# Patient Record
Sex: Male | Born: 1955
Health system: Southern US, Community
[De-identification: ages and names within clinical notes are randomized; demographics above are authoritative.]

## PROBLEM LIST (undated history)

## (undated) DIAGNOSIS — G4700 Insomnia, unspecified: Secondary | ICD-10-CM

## (undated) DIAGNOSIS — M5126 Other intervertebral disc displacement, lumbar region: Secondary | ICD-10-CM

## (undated) DIAGNOSIS — D696 Thrombocytopenia, unspecified: Secondary | ICD-10-CM

## (undated) DIAGNOSIS — R609 Edema, unspecified: Secondary | ICD-10-CM

## (undated) DIAGNOSIS — I10 Essential (primary) hypertension: Secondary | ICD-10-CM

## (undated) DIAGNOSIS — I509 Heart failure, unspecified: Secondary | ICD-10-CM

## (undated) DIAGNOSIS — G894 Chronic pain syndrome: Secondary | ICD-10-CM

## (undated) DIAGNOSIS — E46 Unspecified protein-calorie malnutrition: Secondary | ICD-10-CM

## (undated) DIAGNOSIS — K760 Fatty (change of) liver, not elsewhere classified: Secondary | ICD-10-CM

## (undated) DIAGNOSIS — I952 Hypotension due to drugs: Secondary | ICD-10-CM

## (undated) DIAGNOSIS — E871 Hypo-osmolality and hyponatremia: Secondary | ICD-10-CM

## (undated) DIAGNOSIS — D638 Anemia in other chronic diseases classified elsewhere: Secondary | ICD-10-CM

## (undated) DIAGNOSIS — R600 Localized edema: Secondary | ICD-10-CM

## (undated) DIAGNOSIS — R0600 Dyspnea, unspecified: Secondary | ICD-10-CM

## (undated) DIAGNOSIS — K5903 Drug induced constipation: Secondary | ICD-10-CM

## (undated) DIAGNOSIS — E785 Hyperlipidemia, unspecified: Secondary | ICD-10-CM

## (undated) DIAGNOSIS — E8809 Other disorders of plasma-protein metabolism, not elsewhere classified: Secondary | ICD-10-CM

## (undated) DIAGNOSIS — J9601 Acute respiratory failure with hypoxia: Secondary | ICD-10-CM

## (undated) DIAGNOSIS — R579 Shock, unspecified: Secondary | ICD-10-CM

## (undated) DIAGNOSIS — E876 Hypokalemia: Secondary | ICD-10-CM

## (undated) DIAGNOSIS — K7460 Unspecified cirrhosis of liver: Secondary | ICD-10-CM

## (undated) HISTORY — PX: HEMORROIDECTOMY: SUR656

## (undated) HISTORY — DX: Fatty (change of) liver, not elsewhere classified: K76.0

## (undated) HISTORY — DX: Heart failure, unspecified: I50.9

---
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
## 2016-04-15 DIAGNOSIS — I10 Essential (primary) hypertension: Secondary | ICD-10-CM | POA: Diagnosis not present

## 2016-04-16 DIAGNOSIS — M47896 Other spondylosis, lumbar region: Secondary | ICD-10-CM | POA: Diagnosis not present

## 2016-04-16 DIAGNOSIS — F329 Major depressive disorder, single episode, unspecified: Secondary | ICD-10-CM | POA: Diagnosis not present

## 2016-04-16 DIAGNOSIS — I10 Essential (primary) hypertension: Secondary | ICD-10-CM | POA: Diagnosis not present

## 2016-04-16 DIAGNOSIS — M25562 Pain in left knee: Secondary | ICD-10-CM | POA: Diagnosis not present

## 2016-04-16 DIAGNOSIS — E785 Hyperlipidemia, unspecified: Secondary | ICD-10-CM | POA: Diagnosis not present

## 2016-04-16 DIAGNOSIS — J45909 Unspecified asthma, uncomplicated: Secondary | ICD-10-CM | POA: Diagnosis not present

## 2016-04-16 DIAGNOSIS — M48061 Spinal stenosis, lumbar region without neurogenic claudication: Secondary | ICD-10-CM | POA: Diagnosis not present

## 2016-04-16 DIAGNOSIS — I872 Venous insufficiency (chronic) (peripheral): Secondary | ICD-10-CM | POA: Diagnosis not present

## 2016-04-16 DIAGNOSIS — G4700 Insomnia, unspecified: Secondary | ICD-10-CM | POA: Diagnosis not present

## 2016-07-13 DIAGNOSIS — J45909 Unspecified asthma, uncomplicated: Secondary | ICD-10-CM | POA: Diagnosis not present

## 2016-07-13 DIAGNOSIS — I10 Essential (primary) hypertension: Secondary | ICD-10-CM | POA: Diagnosis not present

## 2016-07-13 DIAGNOSIS — I872 Venous insufficiency (chronic) (peripheral): Secondary | ICD-10-CM | POA: Diagnosis not present

## 2016-07-13 DIAGNOSIS — M48061 Spinal stenosis, lumbar region without neurogenic claudication: Secondary | ICD-10-CM | POA: Diagnosis not present

## 2016-07-13 DIAGNOSIS — G4700 Insomnia, unspecified: Secondary | ICD-10-CM | POA: Diagnosis not present

## 2016-07-13 DIAGNOSIS — M47896 Other spondylosis, lumbar region: Secondary | ICD-10-CM | POA: Diagnosis not present

## 2016-07-13 DIAGNOSIS — M25562 Pain in left knee: Secondary | ICD-10-CM | POA: Diagnosis not present

## 2016-07-13 DIAGNOSIS — E785 Hyperlipidemia, unspecified: Secondary | ICD-10-CM | POA: Diagnosis not present

## 2016-08-10 DIAGNOSIS — I10 Essential (primary) hypertension: Secondary | ICD-10-CM | POA: Diagnosis not present

## 2016-08-11 DIAGNOSIS — J45909 Unspecified asthma, uncomplicated: Secondary | ICD-10-CM | POA: Diagnosis not present

## 2016-08-11 DIAGNOSIS — M48061 Spinal stenosis, lumbar region without neurogenic claudication: Secondary | ICD-10-CM | POA: Diagnosis not present

## 2016-08-11 DIAGNOSIS — G4700 Insomnia, unspecified: Secondary | ICD-10-CM | POA: Diagnosis not present

## 2016-08-11 DIAGNOSIS — I10 Essential (primary) hypertension: Secondary | ICD-10-CM | POA: Diagnosis not present

## 2016-08-11 DIAGNOSIS — E785 Hyperlipidemia, unspecified: Secondary | ICD-10-CM | POA: Diagnosis not present

## 2016-08-11 DIAGNOSIS — I872 Venous insufficiency (chronic) (peripheral): Secondary | ICD-10-CM | POA: Diagnosis not present

## 2016-08-11 DIAGNOSIS — M47896 Other spondylosis, lumbar region: Secondary | ICD-10-CM | POA: Diagnosis not present

## 2016-11-09 DIAGNOSIS — I10 Essential (primary) hypertension: Secondary | ICD-10-CM | POA: Diagnosis not present

## 2016-11-10 DIAGNOSIS — M47896 Other spondylosis, lumbar region: Secondary | ICD-10-CM | POA: Diagnosis not present

## 2016-11-10 DIAGNOSIS — E785 Hyperlipidemia, unspecified: Secondary | ICD-10-CM | POA: Diagnosis not present

## 2016-11-10 DIAGNOSIS — I872 Venous insufficiency (chronic) (peripheral): Secondary | ICD-10-CM | POA: Diagnosis not present

## 2016-11-10 DIAGNOSIS — I10 Essential (primary) hypertension: Secondary | ICD-10-CM | POA: Diagnosis not present

## 2016-11-10 DIAGNOSIS — G4700 Insomnia, unspecified: Secondary | ICD-10-CM | POA: Diagnosis not present

## 2016-11-10 DIAGNOSIS — M48061 Spinal stenosis, lumbar region without neurogenic claudication: Secondary | ICD-10-CM | POA: Diagnosis not present

## 2016-11-10 DIAGNOSIS — J45909 Unspecified asthma, uncomplicated: Secondary | ICD-10-CM | POA: Diagnosis not present

## 2017-02-02 DIAGNOSIS — I10 Essential (primary) hypertension: Secondary | ICD-10-CM | POA: Diagnosis not present

## 2017-02-03 DIAGNOSIS — J45909 Unspecified asthma, uncomplicated: Secondary | ICD-10-CM | POA: Diagnosis not present

## 2017-02-03 DIAGNOSIS — E785 Hyperlipidemia, unspecified: Secondary | ICD-10-CM | POA: Diagnosis not present

## 2017-02-03 DIAGNOSIS — I10 Essential (primary) hypertension: Secondary | ICD-10-CM | POA: Diagnosis not present

## 2017-02-03 DIAGNOSIS — G4700 Insomnia, unspecified: Secondary | ICD-10-CM | POA: Diagnosis not present

## 2017-02-03 DIAGNOSIS — I872 Venous insufficiency (chronic) (peripheral): Secondary | ICD-10-CM | POA: Diagnosis not present

## 2017-02-03 DIAGNOSIS — M48061 Spinal stenosis, lumbar region without neurogenic claudication: Secondary | ICD-10-CM | POA: Diagnosis not present

## 2017-02-03 DIAGNOSIS — M47896 Other spondylosis, lumbar region: Secondary | ICD-10-CM | POA: Diagnosis not present

## 2017-03-02 DIAGNOSIS — F5101 Primary insomnia: Secondary | ICD-10-CM | POA: Diagnosis not present

## 2017-03-02 DIAGNOSIS — M5442 Lumbago with sciatica, left side: Secondary | ICD-10-CM | POA: Diagnosis not present

## 2017-03-02 DIAGNOSIS — G8929 Other chronic pain: Secondary | ICD-10-CM | POA: Diagnosis not present

## 2017-03-02 DIAGNOSIS — M5441 Lumbago with sciatica, right side: Secondary | ICD-10-CM | POA: Diagnosis not present

## 2017-03-15 DIAGNOSIS — Z79891 Long term (current) use of opiate analgesic: Secondary | ICD-10-CM | POA: Diagnosis not present

## 2017-07-21 DIAGNOSIS — F5101 Primary insomnia: Secondary | ICD-10-CM | POA: Diagnosis not present

## 2017-07-21 DIAGNOSIS — I10 Essential (primary) hypertension: Secondary | ICD-10-CM | POA: Diagnosis not present

## 2017-07-21 DIAGNOSIS — Z79899 Other long term (current) drug therapy: Secondary | ICD-10-CM | POA: Diagnosis not present

## 2017-07-21 DIAGNOSIS — G8929 Other chronic pain: Secondary | ICD-10-CM | POA: Diagnosis not present

## 2017-07-21 DIAGNOSIS — E7801 Familial hypercholesterolemia: Secondary | ICD-10-CM | POA: Diagnosis not present

## 2017-07-21 DIAGNOSIS — M5441 Lumbago with sciatica, right side: Secondary | ICD-10-CM | POA: Diagnosis not present

## 2018-02-17 DIAGNOSIS — D487 Neoplasm of uncertain behavior of other specified sites: Secondary | ICD-10-CM | POA: Diagnosis not present

## 2018-02-17 DIAGNOSIS — E7801 Familial hypercholesterolemia: Secondary | ICD-10-CM | POA: Diagnosis not present

## 2018-02-17 DIAGNOSIS — G8929 Other chronic pain: Secondary | ICD-10-CM | POA: Diagnosis not present

## 2018-02-17 DIAGNOSIS — Z79899 Other long term (current) drug therapy: Secondary | ICD-10-CM | POA: Diagnosis not present

## 2018-02-17 DIAGNOSIS — I10 Essential (primary) hypertension: Secondary | ICD-10-CM | POA: Diagnosis not present

## 2018-02-17 DIAGNOSIS — M5441 Lumbago with sciatica, right side: Secondary | ICD-10-CM | POA: Diagnosis not present

## 2018-02-17 DIAGNOSIS — F5101 Primary insomnia: Secondary | ICD-10-CM | POA: Diagnosis not present

## 2018-03-07 DIAGNOSIS — L570 Actinic keratosis: Secondary | ICD-10-CM | POA: Diagnosis not present

## 2018-03-07 DIAGNOSIS — L821 Other seborrheic keratosis: Secondary | ICD-10-CM | POA: Diagnosis not present

## 2018-03-07 DIAGNOSIS — D485 Neoplasm of uncertain behavior of skin: Secondary | ICD-10-CM | POA: Diagnosis not present

## 2018-03-19 DIAGNOSIS — J189 Pneumonia, unspecified organism: Secondary | ICD-10-CM | POA: Diagnosis not present

## 2018-04-20 DIAGNOSIS — I8310 Varicose veins of unspecified lower extremity with inflammation: Secondary | ICD-10-CM | POA: Diagnosis not present

## 2018-04-20 DIAGNOSIS — L578 Other skin changes due to chronic exposure to nonionizing radiation: Secondary | ICD-10-CM | POA: Diagnosis not present

## 2018-08-25 DIAGNOSIS — I10 Essential (primary) hypertension: Secondary | ICD-10-CM | POA: Diagnosis not present

## 2018-08-25 DIAGNOSIS — E7801 Familial hypercholesterolemia: Secondary | ICD-10-CM | POA: Diagnosis not present

## 2018-08-25 DIAGNOSIS — R0601 Orthopnea: Secondary | ICD-10-CM | POA: Diagnosis not present

## 2018-08-25 DIAGNOSIS — Z79899 Other long term (current) drug therapy: Secondary | ICD-10-CM | POA: Diagnosis not present

## 2018-08-25 DIAGNOSIS — R600 Localized edema: Secondary | ICD-10-CM | POA: Diagnosis not present

## 2018-08-25 DIAGNOSIS — F5101 Primary insomnia: Secondary | ICD-10-CM | POA: Diagnosis not present

## 2018-08-31 DIAGNOSIS — I8310 Varicose veins of unspecified lower extremity with inflammation: Secondary | ICD-10-CM | POA: Diagnosis not present

## 2018-08-31 DIAGNOSIS — L820 Inflamed seborrheic keratosis: Secondary | ICD-10-CM | POA: Diagnosis not present

## 2018-09-12 DIAGNOSIS — D696 Thrombocytopenia, unspecified: Secondary | ICD-10-CM | POA: Diagnosis not present

## 2018-09-28 DIAGNOSIS — R600 Localized edema: Secondary | ICD-10-CM | POA: Diagnosis not present

## 2018-09-28 DIAGNOSIS — R0600 Dyspnea, unspecified: Secondary | ICD-10-CM | POA: Diagnosis not present

## 2018-09-28 DIAGNOSIS — R5383 Other fatigue: Secondary | ICD-10-CM | POA: Diagnosis not present

## 2018-09-29 DIAGNOSIS — R0602 Shortness of breath: Secondary | ICD-10-CM | POA: Diagnosis not present

## 2018-09-29 DIAGNOSIS — R0600 Dyspnea, unspecified: Secondary | ICD-10-CM | POA: Diagnosis not present

## 2018-09-30 DIAGNOSIS — M5441 Lumbago with sciatica, right side: Secondary | ICD-10-CM | POA: Diagnosis not present

## 2018-09-30 DIAGNOSIS — R0600 Dyspnea, unspecified: Secondary | ICD-10-CM | POA: Diagnosis not present

## 2018-09-30 DIAGNOSIS — R5383 Other fatigue: Secondary | ICD-10-CM | POA: Diagnosis not present

## 2018-10-24 DIAGNOSIS — G4733 Obstructive sleep apnea (adult) (pediatric): Secondary | ICD-10-CM | POA: Diagnosis not present

## 2018-10-24 DIAGNOSIS — G2581 Restless legs syndrome: Secondary | ICD-10-CM | POA: Diagnosis not present

## 2018-10-24 DIAGNOSIS — R5383 Other fatigue: Secondary | ICD-10-CM | POA: Diagnosis not present

## 2018-10-24 DIAGNOSIS — E662 Morbid (severe) obesity with alveolar hypoventilation: Secondary | ICD-10-CM | POA: Diagnosis not present

## 2018-10-24 DIAGNOSIS — J4540 Moderate persistent asthma, uncomplicated: Secondary | ICD-10-CM | POA: Diagnosis not present

## 2019-01-24 ENCOUNTER — Ambulatory Visit (INDEPENDENT_AMBULATORY_CARE_PROVIDER_SITE_OTHER): Payer: Medicare Other | Admitting: Cardiology

## 2019-01-24 ENCOUNTER — Encounter: Payer: Self-pay | Admitting: *Deleted

## 2019-01-24 ENCOUNTER — Other Ambulatory Visit: Payer: Self-pay | Admitting: *Deleted

## 2019-01-24 ENCOUNTER — Other Ambulatory Visit: Payer: Self-pay

## 2019-01-24 VITALS — BP 130/70 | HR 78 | Ht >= 80 in | Wt >= 6400 oz

## 2019-01-24 DIAGNOSIS — I10 Essential (primary) hypertension: Secondary | ICD-10-CM

## 2019-01-24 DIAGNOSIS — E785 Hyperlipidemia, unspecified: Secondary | ICD-10-CM

## 2019-01-24 DIAGNOSIS — Z6841 Body Mass Index (BMI) 40.0 and over, adult: Secondary | ICD-10-CM

## 2019-01-24 DIAGNOSIS — G4700 Insomnia, unspecified: Secondary | ICD-10-CM

## 2019-01-24 DIAGNOSIS — E782 Mixed hyperlipidemia: Secondary | ICD-10-CM | POA: Insufficient documentation

## 2019-01-24 DIAGNOSIS — I509 Heart failure, unspecified: Secondary | ICD-10-CM

## 2019-01-24 DIAGNOSIS — K760 Fatty (change of) liver, not elsewhere classified: Secondary | ICD-10-CM

## 2019-01-24 DIAGNOSIS — M5126 Other intervertebral disc displacement, lumbar region: Secondary | ICD-10-CM | POA: Insufficient documentation

## 2019-01-24 HISTORY — DX: Essential (primary) hypertension: I10

## 2019-01-24 HISTORY — DX: Insomnia, unspecified: G47.00

## 2019-01-24 HISTORY — DX: Hyperlipidemia, unspecified: E78.5

## 2019-01-24 HISTORY — DX: Other intervertebral disc displacement, lumbar region: M51.26

## 2019-01-24 HISTORY — DX: Mixed hyperlipidemia: E78.2

## 2019-01-24 LAB — BASIC METABOLIC PANEL
BUN/Creatinine Ratio: 17 (ref 10–24)
BUN: 18 mg/dL (ref 8–27)
CO2: 23 mmol/L (ref 20–29)
Calcium: 9.7 mg/dL (ref 8.6–10.2)
Chloride: 97 mmol/L (ref 96–106)
Creatinine, Ser: 1.08 mg/dL (ref 0.76–1.27)
GFR calc Af Amer: 84 mL/min/{1.73_m2} (ref >59)
GFR calc non Af Amer: 73 mL/min/{1.73_m2} (ref >59)
Glucose: 96 mg/dL (ref 65–99)
Potassium: 4.2 mmol/L (ref 3.5–5.2)
Sodium: 137 mmol/L (ref 134–144)

## 2019-01-24 LAB — HEPATIC FUNCTION PANEL
ALT: 30 IU/L (ref 0–44)
AST: 37 IU/L (ref 0–40)
Albumin: 4.5 g/dL (ref 3.8–4.8)
Alkaline Phosphatase: 77 IU/L (ref 39–117)
Bilirubin Total: 0.6 mg/dL (ref 0.0–1.2)
Bilirubin, Direct: 0.22 mg/dL (ref 0.00–0.40)
Total Protein: 7.4 g/dL (ref 6.0–8.5)

## 2019-01-24 LAB — LIPID PANEL
Chol/HDL Ratio: 3 ratio (ref 0.0–5.0)
Cholesterol, Total: 128 mg/dL (ref 100–199)
HDL: 42 mg/dL (ref >39)
LDL Chol Calc (NIH): 64 mg/dL (ref 0–99)
Triglycerides: 121 mg/dL (ref 0–149)
VLDL Cholesterol Cal: 22 mg/dL (ref 5–40)

## 2019-01-24 LAB — MAGNESIUM: Magnesium: 2.2 mg/dL (ref 1.6–2.3)

## 2019-01-24 LAB — PRO B NATRIURETIC PEPTIDE: NT-Pro BNP: 52 pg/mL (ref 0–210)

## 2019-01-24 MED ORDER — FUROSEMIDE 40 MG PO TABS: 40.0000 mg | ORAL_TABLET | Freq: Two times a day (BID) | ORAL | 3 refills | Status: DC

## 2019-01-24 NOTE — Progress Notes (Signed)
Cardiology Office Note:    Date:  01/24/2019   ID:  Jamey Harman, DOB [birth year], MRN [id number]  PCP:  Enid Skeens., MD  Cardiologist:  No primary care provider on file.  Electrophysiologist:  None   Referring MD: Enid Skeens., MD   The patient for for shortness of breath.  History of Present Illness:    Charles Gray is a 63 y.o. male with a hx of hypertension, hyperlipidemia, obesity, history of alcoholic fatty liver disease presents to be evaluated for shortness of breath and fatigue. The patient tells me that for about a year now he has been experiencing worsening shortness of breath exertion.  But he tells me over the last 2 months he has gotten so bad that he sleeps in a recliner.  He reports orthopnea and PND.  He states that he has gotten significant bilateral leg edema as well as truncal obesity.  He denies any chest pain, headedness, dizziness.   Past Medical History:  Diagnosis Date   Fatty liver disease, nonalcoholic    Hyperlipidemia 01/24/2019   Hypertension 01/24/2019   Insomnia 01/24/2019   Lumbar disc herniation 01/24/2019    Past Surgical History:  Procedure Laterality Date   HEMORROIDECTOMY      Current Medications: Current Meds  Medication Sig   buprenorphine-naloxone (SUBOXONE) 8-2 mg SUBL SL tablet Place 2 tablets under the tongue daily.    lisinopril (ZESTRIL) 30 MG tablet 30 mg daily.   potassium chloride SA (KLOR-CON) 20 MEQ tablet 20 mEq daily.   pravastatin (PRAVACHOL) 20 MG tablet 20 mg daily.   VENTOLIN HFA 108 (90 Base) MCG/ACT inhaler    zolpidem (AMBIEN) 10 MG tablet 10 mg at bedtime as needed.   [DISCONTINUED] triamterene-hydrochlorothiazide (MAXZIDE-25) 37.5-25 MG tablet Take 1 tablet once daily     Allergies:   Penicillins   Social History   Socioeconomic History   Marital status: Unknown    Spouse name: Not on file   Number of children: Not on file   Years of education: Not on file   Highest  education level: Not on file  Occupational History   Not on file  Social Needs   Financial resource strain: Not on file   Food insecurity    Worry: Not on file    Inability: Not on file   Transportation needs    Medical: Not on file    Non-medical: Not on file  Tobacco Use   Smoking status: Never Smoker   Smokeless tobacco: Never Used  Substance and Sexual Activity   Alcohol use: Never    Frequency: Never   Drug use: Not Currently    Types: Marijuana    Comment: Smoked for 30 years   Sexual activity: Not on file  Lifestyle   Physical activity    Days per week: Not on file    Minutes per session: Not on file   Stress: Not on file  Relationships   Social connections    Talks on phone: Not on file    Gets together: Not on file    Attends religious service: Not on file    Active member of club or organization: Not on file    Attends meetings of clubs or organizations: Not on file    Relationship status: Not on file  Other Topics Concern   Not on file  Social History Narrative   Not on file     Family History: The patient's family history includes CAD in his maternal  and maternal grandmother; Hyperlipidemia in his mother; Hypertension in his mother; Stroke in his mother. ° °ROS:   °Review of Systems  °Constitution: Negative for decreased appetite, fever and weight gain.  °HENT: Negative for congestion, ear discharge, hoarse voice and sore throat.   °Eyes: Negative for discharge, redness, vision loss in right eye and visual halos.  °Cardiovascular: Reports dyspnea on exertion, orthopnea, PND, bilateral leg swelling. Negative for chest pain, palpitations.  °Respiratory: Negative for cough, hemoptysis, shortness of breath and snoring.   °Endocrine: Negative for heat intolerance and polyphagia.  °Hematologic/Lymphatic: Negative for bleeding problem. Does not bruise/bleed easily.  °Skin: Negative for flushing, nail changes, rash and suspicious lesions.    °Musculoskeletal: Negative for arthritis, joint pain, muscle cramps, myalgias, neck pain and stiffness.  °Gastrointestinal: Negative for abdominal pain, bowel incontinence, diarrhea and excessive appetite.  °Genitourinary: Negative for decreased libido, genital sores and incomplete emptying.  °Neurological: Negative for brief paralysis, focal weakness, headaches and loss of balance.  °Psychiatric/Behavioral: Negative for altered mental status, depression and suicidal ideas.  °Allergic/Immunologic: Negative for HIV exposure and persistent infections.  ° ° °EKGs/Labs/Other Studies Reviewed:   ° °The following studies were reviewed today: ° °  °EKG:  The ekg ordered today demonstrates sinus rhythm, heart rate 76 bpm left nonspecific interventricular conduction defect and left axis deviation.  compared to EKG performed on September 28, 2018 sinus rhythm, heart rate 70 bpm, left anterior fascicle block. ° °Chest x-ray performed September 30, 2018 reported no acute abnormalities. ° °Recent Labs: °No results found for requested labs within last 8760 hours.  °Recent Lipid Panel °No results found for: CHOL, TRIG, HDL, CHOLHDL, VLDL, LDLCALC, LDLDIRECT ° °Physical Exam:   ° °VS:  BP 130/70 (BP Location: Left Arm, Patient Position: Sitting, Cuff Size: Large)    Pulse 78    Ht 6' 9.5" (2.07 m)    Wt (!) 410 lb (186 kg)    SpO2 96%    BMI 43.40 kg/m²    ° °Wt Readings from Last 3 Encounters:  °01/24/19 (!) 410 lb (186 kg)  °  °GEN: Patient is obese.  Well nourished, well developed in no acute distress °HEENT: Normal °NECK: No JVD; No carotid bruits °LYMPHATICS: No lymphadenopathy °CARDIAC: S1S2 noted,RRR, no murmurs, rubs, gallops °RESPIRATORY: Mild bibasilar crackles, without rales, wheezing or rhonchi  °ABDOMEN: Soft, non-tender, truncal obesity, +bowel sounds, no guarding. °EXTREMITIES: Bilateral +3 edema, no cyanosis, no clubbing °MUSCULOSKELETAL: No deformity  °SKIN: Warm and dry °NEUROLOGIC:  Alert and oriented x 3,  non-focal °PSYCHIATRIC:  Normal affect, good insight ° °ASSESSMENT:   ° °1. Hyperlipidemia, unspecified hyperlipidemia type   °2. Non-alcoholic fatty liver disease   °3. Hypertension, unspecified type   °4. Chronic congestive heart failure, unspecified heart failure type (HCC)   °5. Morbid obesity with BMI of 40.0-44.9, adult (HCC)   ° °PLAN:   ° °1. Charles Gray does have clinical evidence of fluid overload (bilateral leg edema, bibasilar crackles) and his history does suggest clinical heart failure syndrome.  He is naïve to loop diuretics therefore I am going to start patient on Lasix 40 mg twice daily.  He will also be continuing his potassium 20 mEq which he already takes.  For now we will stop his hydrochlorothiazide.  Blood work will be done today to assess his electrolytes as well as for BNP.  Given his aggressive diuretics I am going to see patient in 1 week, hopefully he will have some improvement with his current status.    If he shows no improvement of his volume status it would be appropriate at that time to send patient to the ED for IV diuretics. °He is unable to his weigh himself at home therefore we will be able to compare his weights as well to see if there is any other objective improvements. ° °2.  A transthoracic echocardiogram will be performed today to assess RV/LV function and for any structural abnormalities. ° °3.  He does have a history of nonalcoholic fatty liver disease he has not been seen by gastroenterologist for many years now.  I would like for the patient be evaluated by GI therefore I have placed a referral for him to be evaluated. ° °4.  Lipid profile will be performed today as well. ° °5.  The patient understands the need to lose weight with diet and exercise. We have discussed specific strategies for this. ° °6.  His blood pressure was acceptable in the office today. ° °The patient is in agreement with the above plan. The patient left the office in stable condition.  The patient  will follow up in 1 week. ° °Medication Adjustments/Labs and Tests Ordered: °Current medicines are reviewed at length with the patient today.  Concerns regarding medicines are outlined above.  °Orders Placed This Encounter  °Procedures  °• Pro b natriuretic peptide (BNP)  °• Basic Metabolic Panel (BMET)  °• Magnesium  °• Hepatic function panel  °• Lipid Profile  °• Ambulatory referral to Gastroenterology  °• EKG 12-Lead  °• ECHOCARDIOGRAM COMPLETE  ° °Meds ordered this encounter  °Medications  °• furosemide (LASIX) 40 MG tablet  °  Sig: Take 1 tablet (40 mg total) by mouth 2 (two) times daily.  °  Dispense:  60 tablet  °  Refill:  3  ° ° °Patient Instructions  °Medication Instructions:  °Your physician has recommended you make the following change in your medication:  ° °STOP triamterene-hydrochlorothiazide ° °START furosemide (lasix) 40 mg: Take 1 tablet twice daily ° °If you need a refill on your cardiac medications before your next appointment, please call your pharmacy.  ° °Lab work: °Your physician recommends that you return for lab work today: ProBNP, BMP, Magnesium, hepatic function panel, lipid panel.  ° °If you have labs (blood work) drawn today and your tests are completely normal, you will receive your results only by: °• MyChart Message (if you have MyChart) OR °• A paper copy in the mail °If you have any lab test that is abnormal or we need to change your treatment, we will call you to review the results. ° °Testing/Procedures: °You had an EKG today.  ° °Your physician has requested that you have an echocardiogram. Echocardiography is a painless test that uses sound waves to create images of your heart. It provides your doctor with information about the size and shape of your heart and how well your heart’s chambers and valves are working. This procedure takes approximately one hour. There are no restrictions for this procedure. ° °You have been referred to see a gastroenterologist, Dr. Misenheimer, due  to non-alcoholic fatty liver disease. You will be contacted to schedule this appointment.  ° °Follow-Up: °At [facility], you and your health needs are our priority.  As part of our continuing mission to provide you with exceptional heart care, we have created designated Provider Care Teams.  These Care Teams include your primary Cardiologist (physician) and Advanced Practice Providers (APPs -  Physician Assistants and Nurse Practitioners) who all work together to   provide you with the care you need, when you need it. You will need a follow up appointment in 1 weeks.      Furosemide tablets What is this medicine? FUROSEMIDE (fyoor [place] se mide) is a diuretic. It helps you make more urine and to lose salt and excess water from your body. This medicine is used to treat high blood pressure, and edema or swelling from heart, kidney, or liver disease. This medicine may be used for other purposes; ask your health care provider or pharmacist if you have questions. COMMON BRAND NAME(S): Active-Medicated Specimen Kit, Delone, Diuscreen, Lasix, RX Specimen Collection Kit, Specimen Collection Kit, URINX Medicated Specimen Collection What should I tell my health care provider before I take this medicine? They need to know if you have any of these conditions:  abnormal blood electrolytes  diarrhea or vomiting  gout  heart disease  kidney disease, small amounts of urine, or difficulty passing urine  liver disease  thyroid disease  an unusual or allergic reaction to furosemide, sulfa drugs, other medicines, foods, dyes, or preservatives  pregnant or trying to get pregnant  breast-feeding How should I use this medicine? Take this medicine by mouth with a glass of water. Follow the directions on the prescription label. You may take this medicine with or without food. If it upsets your stomach, take it with food or milk. Do not take your medicine more often than directed. Remember that you will need  to pass more urine after taking this medicine. Do not take your medicine at a time of day that will cause you problems. Do not take at bedtime. Talk to your pediatrician regarding the use of this medicine in children. While this drug may be prescribed for selected conditions, precautions do apply. Overdosage: If you think you have taken too much of this medicine contact a poison control center or emergency room at once. NOTE: This medicine is only for you. Do not share this medicine with others. What if I miss a dose? If you miss a dose, take it as soon as you can. If it is almost time for your next dose, take only that dose. Do not take double or extra doses. What may interact with this medicine?  aspirin and aspirin-like medicines  certain antibiotics  chloral hydrate  cisplatin  cyclosporine  digoxin  diuretics  laxatives  lithium  medicines for blood pressure  medicines that relax muscles for surgery  methotrexate  NSAIDs, medicines for pain and inflammation like ibuprofen, naproxen, or indomethacin  phenytoin  steroid medicines like prednisone or cortisone  sucralfate  thyroid hormones This list may not describe all possible interactions. Give your health care provider a list of all the medicines, herbs, non-prescription drugs, or dietary supplements you use. Also tell them if you smoke, drink alcohol, or use illegal drugs. Some items may interact with your medicine. What should I watch for while using this medicine? Visit your doctor or health care provider for regular checks on your progress. Check your blood pressure regularly. Ask your doctor or health care provider what your blood pressure should be, and when you should contact him or her. If you are a diabetic, check your blood sugar as directed. This medicine may cause serious skin reactions. They can happen weeks to months after starting the medicine. Contact your health care provider right away if you notice  fevers or flu-like symptoms with a rash. The rash may be red or purple and then turn into blisters or peeling  of the skin. Or, you might notice a red rash with swelling of the face, lips or lymph nodes in your neck or under your arms. °You may need to be on a special diet while taking this medicine. Check with your doctor. Also, ask how many glasses of fluid you need to drink a day. You must not get dehydrated. °You may get drowsy or dizzy. Do not drive, use machinery, or do anything that needs mental alertness until you know how this drug affects you. Do not stand or sit up quickly, especially if you are an older patient. This reduces the risk of dizzy or fainting spells. Alcohol can make you more drowsy and dizzy. Avoid alcoholic drinks. °This medicine can make you more sensitive to the sun. Keep out of the sun. If you cannot avoid being in the sun, wear protective clothing and use sunscreen. Do not use sun lamps or tanning beds/booths. °What side effects may I notice from receiving this medicine? °Side effects that you should report to your doctor or health care professional as soon as possible: °· blood in urine or stools °· dry mouth °· fever or chills °· hearing loss or ringing in the ears °· irregular heartbeat °· muscle pain or weakness, cramps °· rash, fever, and swollen lymph nodes °· redness, blistering, peeling or loosening of the skin, including inside the mouth °· skin rash °· stomach upset, pain, or nausea °· tingling or numbness in the hands or feet °· unusually weak or tired °· vomiting or diarrhea °· yellowing of the eyes or skin °Side effects that usually do not require medical attention (report to your doctor or health care professional if they continue or are bothersome): °· headache °· loss of appetite °· unusual bleeding or bruising °This list may not describe all possible side effects. Call your doctor for medical advice about side effects. You may report side effects to FDA at  1-800-FDA-1088. °Where should I keep my medicine? °Keep out of the reach of children. °Store at room temperature between 15 and 30 degrees C (59 and 86 degrees F). Protect from light. Throw away any unused medicine after the expiration date. °NOTE: This sheet is a summary. It may not cover all possible information. If you have questions about this medicine, talk to your doctor, pharmacist, or health care provider. °© 2020 Elsevier/Gold Standard (2018-07-01 14:04:13) ° ° ° °Echocardiogram °An echocardiogram is a procedure that uses painless sound waves (ultrasound) to produce an image of the heart. Images from an echocardiogram can provide important information about: °· Signs of coronary artery disease (CAD). °· Aneurysm detection. An aneurysm is a weak or damaged part of an artery wall that bulges out from the normal force of blood pumping through the body. °· Heart size and shape. Changes in the size or shape of the heart can be associated with certain conditions, including heart failure, aneurysm, and CAD. °· Heart muscle function. °· Heart valve function. °· Signs of a past heart attack. °· Fluid buildup around the heart. °· Thickening of the heart muscle. °· A tumor or infectious growth around the heart valves. °Tell a health care provider about: °· Any allergies you have. °· All medicines you are taking, including vitamins, herbs, eye drops, creams, and over-the-counter medicines. °· Any blood disorders you have. °· Any surgeries you have had. °· Any medical conditions you have. °· Whether you are pregnant or may be pregnant. °What are the risks? °Generally, this is a safe procedure. However, problems   may occur, including: °· Allergic reaction to dye (contrast) that may be used during the procedure. °What happens before the procedure? °No specific preparation is needed. You may eat and drink normally. °What happens during the procedure? ° °· An IV tube may be inserted into one of your veins. °· You may receive  contrast through this tube. A contrast is an injection that improves the quality of the pictures from your heart. °· A gel will be applied to your chest. °· A wand-like tool (transducer) will be moved over your chest. The gel will help to transmit the sound waves from the transducer. °· The sound waves will harmlessly bounce off of your heart to allow the heart images to be captured in real-time motion. The images will be recorded on a computer. °The procedure may vary among health care providers and hospitals. °What happens after the procedure? °· You may return to your normal, everyday life, including diet, activities, and medicines, unless your health care provider tells you not to do that. °Summary °· An echocardiogram is a procedure that uses painless sound waves (ultrasound) to produce an image of the heart. °· Images from an echocardiogram can provide important information about the size and shape of your heart, heart muscle function, heart valve function, and fluid buildup around your heart. °· You do not need to do anything to prepare before this procedure. You may eat and drink normally. °· After the echocardiogram is completed, you may return to your normal, everyday life, unless your health care provider tells you not to do that. °This information is not intended to replace advice given to you by your health care provider. Make sure you discuss any questions you have with your health care provider. °Document Released: 03/27/2000 Document Revised: 07/21/2018 Document Reviewed: 05/02/2016 °Elsevier Patient Education © 2020 [employer] ° °  ° °Adopting a Healthy Lifestyle. ° °Know what a healthy weight is for you (roughly BMI <25) and aim to maintain this °  °Aim for 7+ servings of fruits and vegetables daily °  °65-80+ fluid ounces of water or unsweet tea for healthy kidneys °  °Limit to max 1 drink of alcohol per day; avoid smoking/tobacco °  °Limit animal fats in diet for cholesterol and heart health -  choose grass fed whenever available °  °Avoid highly processed foods, and foods high in saturated/trans fats °  °Aim for low stress - take time to unwind and care for your mental health °  °Aim for 150 min of moderate intensity exercise weekly for heart health, and weights twice weekly for bone health °  °Aim for 7-9 hours of sleep daily °  °When it comes to diets, agreement about the perfect plan isn´t easy to find, even among the experts. °Experts at the [employer] developed an idea known as the Healthy Eating Plate. Just imagine a plate divided into logical, healthy portions. °  °The emphasis is on diet quality: °  °Load up on vegetables and fruits - one-half of your plate: Aim for color and variety, and remember that potatoes don´t count. °  °Go for whole grains - one-quarter of your plate: Whole wheat, barley, wheat berries, quinoa, oats, brown rice, and foods made with them. If you want pasta, go with whole wheat pasta. °  °Protein power - one-quarter of your plate: Fish, chicken, beans, and nuts are all healthy, versatile protein sources. Limit red meat. °  °The diet, however, does go beyond the plate, offering   a few other suggestions. °  °Use healthy plant oils, such as olive, canola, soy, corn, sunflower and peanut. Check the labels, and avoid partially hydrogenated oil, which have unhealthy trans fats. °  °If you´re thirsty, drink water. Coffee and tea are good in moderation, but skip sugary drinks and limit milk and dairy products to one or two daily servings. °  °The type of carbohydrate in the diet is more important than the amount. Some sources of carbohydrates, such as vegetables, fruits, whole grains, and beans-are healthier than others. °  °Finally, stay active ° °Signed, ° , DO  °01/24/2019 11:16 AM    ° [facility] °

## 2019-01-24 NOTE — Patient Instructions (Addendum)
Medication Instructions:  Your physician has recommended you make the following change in your medication:   STOP triamterene-hydrochlorothiazide  START furosemide (lasix) 40 mg: Take 1 tablet twice daily  If you need a refill on your cardiac medications before your next appointment, please call your pharmacy.   Lab work: Your physician recommends that you return for lab work today: ProBNP, BMP, Magnesium, hepatic function panel, lipid panel.   If you have labs (blood work) drawn today and your tests are completely normal, you will receive your results only by: Marland Kitchen MyChart Message (if you have MyChart) OR . A paper copy in the mail If you have any lab test that is abnormal or we need to change your treatment, we will call you to review the results.  Testing/Procedures: You had an EKG today.   Your physician has requested that you have an echocardiogram. Echocardiography is a painless test that uses sound waves to create images of your heart. It provides your doctor with information about the size and shape of your heart and how well your heart's chambers and valves are working. This procedure takes approximately one hour. There are no restrictions for this procedure.  You have been referred to see a gastroenterologist, Dr. Lyda Jester, due to non-alcoholic fatty liver disease. You will be contacted to schedule this appointment.   Follow-Up: At [facility], you and your health needs are our priority.  As part of our continuing mission to provide you with exceptional heart care, we have created designated Provider Care Teams.  These Care Teams include your primary Cardiologist (physician) and Advanced Practice Providers (APPs -  Physician Assistants and Nurse Practitioners) who all work together to provide you with the care you need, when you need it. You will need a follow up appointment in 1 weeks.      Furosemide tablets What is this medicine? FUROSEMIDE (fyoor [place] se mide) is a  diuretic. It helps you make more urine and to lose salt and excess water from your body. This medicine is used to treat high blood pressure, and edema or swelling from heart, kidney, or liver disease. This medicine may be used for other purposes; ask your health care provider or pharmacist if you have questions. COMMON BRAND NAME(S): Active-Medicated Specimen Kit, Delone, Diuscreen, Lasix, RX Specimen Collection Kit, Specimen Collection Kit, URINX Medicated Specimen Collection What should I tell my health care provider before I take this medicine? They need to know if you have any of these conditions:  abnormal blood electrolytes  diarrhea or vomiting  gout  heart disease  kidney disease, small amounts of urine, or difficulty passing urine  liver disease  thyroid disease  an unusual or allergic reaction to furosemide, sulfa drugs, other medicines, foods, dyes, or preservatives  pregnant or trying to get pregnant  breast-feeding How should I use this medicine? Take this medicine by mouth with a glass of water. Follow the directions on the prescription label. You may take this medicine with or without food. If it upsets your stomach, take it with food or milk. Do not take your medicine more often than directed. Remember that you will need to pass more urine after taking this medicine. Do not take your medicine at a time of day that will cause you problems. Do not take at bedtime. Talk to your pediatrician regarding the use of this medicine in children. While this drug may be prescribed for selected conditions, precautions do apply. Overdosage: If you think you have taken too much  of this medicine contact a poison control center or emergency room at once. NOTE: This medicine is only for you. Do not share this medicine with others. What if I miss a dose? If you miss a dose, take it as soon as you can. If it is almost time for your next dose, take only that dose. Do not take double or extra  doses. What may interact with this medicine?  aspirin and aspirin-like medicines  certain antibiotics  chloral hydrate  cisplatin  cyclosporine  digoxin  diuretics  laxatives  lithium  medicines for blood pressure  medicines that relax muscles for surgery  methotrexate  NSAIDs, medicines for pain and inflammation like ibuprofen, naproxen, or indomethacin  phenytoin  steroid medicines like prednisone or cortisone  sucralfate  thyroid hormones This list may not describe all possible interactions. Give your health care provider a list of all the medicines, herbs, non-prescription drugs, or dietary supplements you use. Also tell them if you smoke, drink alcohol, or use illegal drugs. Some items may interact with your medicine. What should I watch for while using this medicine? Visit your doctor or health care provider for regular checks on your progress. Check your blood pressure regularly. Ask your doctor or health care provider what your blood pressure should be, and when you should contact him or her. If you are a diabetic, check your blood sugar as directed. This medicine may cause serious skin reactions. They can happen weeks to months after starting the medicine. Contact your health care provider right away if you notice fevers or flu-like symptoms with a rash. The rash may be red or purple and then turn into blisters or peeling of the skin. Or, you might notice a red rash with swelling of the face, lips or lymph nodes in your neck or under your arms. You may need to be on a special diet while taking this medicine. Check with your doctor. Also, ask how many glasses of fluid you need to drink a day. You must not get dehydrated. You may get drowsy or dizzy. Do not drive, use machinery, or do anything that needs mental alertness until you know how this drug affects you. Do not stand or sit up quickly, especially if you are an older patient. This reduces the risk of dizzy or  fainting spells. Alcohol can make you more drowsy and dizzy. Avoid alcoholic drinks. This medicine can make you more sensitive to the sun. Keep out of the sun. If you cannot avoid being in the sun, wear protective clothing and use sunscreen. Do not use sun lamps or tanning beds/booths. What side effects may I notice from receiving this medicine? Side effects that you should report to your doctor or health care professional as soon as possible:  blood in urine or stools  dry mouth  fever or chills  hearing loss or ringing in the ears  irregular heartbeat  muscle pain or weakness, cramps  rash, fever, and swollen lymph nodes  redness, blistering, peeling or loosening of the skin, including inside the mouth  skin rash  stomach upset, pain, or nausea  tingling or numbness in the hands or feet  unusually weak or tired  vomiting or diarrhea  yellowing of the eyes or skin Side effects that usually do not require medical attention (report to your doctor or health care professional if they continue or are bothersome):  headache  loss of appetite  unusual bleeding or bruising This list may not describe all possible side  effects. Call your doctor for medical advice about side effects. You may report side effects to FDA at 1-800-FDA-1088. Where should I keep my medicine? Keep out of the reach of children. Store at room temperature between 15 and 30 degrees C (59 and 86 degrees F). Protect from light. Throw away any unused medicine after the expiration date. NOTE: This sheet is a summary. It may not cover all possible information. If you have questions about this medicine, talk to your doctor, pharmacist, or health care provider.  2020 Elsevier/Gold Standard (2018-07-01 14:04:13)    Echocardiogram An echocardiogram is a procedure that uses painless sound waves (ultrasound) to produce an image of the heart. Images from an echocardiogram can provide important information about:   Signs of coronary artery disease (CAD).  Aneurysm detection. An aneurysm is a weak or damaged part of an artery wall that bulges out from the normal force of blood pumping through the body.  Heart size and shape. Changes in the size or shape of the heart can be associated with certain conditions, including heart failure, aneurysm, and CAD.  Heart muscle function.  Heart valve function.  Signs of a past heart attack.  Fluid buildup around the heart.  Thickening of the heart muscle.  A tumor or infectious growth around the heart valves. Tell a health care provider about:  Any allergies you have.  All medicines you are taking, including vitamins, herbs, eye drops, creams, and over-the-counter medicines.  Any blood disorders you have.  Any surgeries you have had.  Any medical conditions you have.  Whether you are pregnant or may be pregnant. What are the risks? Generally, this is a safe procedure. However, problems may occur, including:  Allergic reaction to dye (contrast) that may be used during the procedure. What happens before the procedure? No specific preparation is needed. You may eat and drink normally. What happens during the procedure?   An IV tube may be inserted into one of your veins.  You may receive contrast through this tube. A contrast is an injection that improves the quality of the pictures from your heart.  A gel will be applied to your chest.  A wand-like tool (transducer) will be moved over your chest. The gel will help to transmit the sound waves from the transducer.  The sound waves will harmlessly bounce off of your heart to allow the heart images to be captured in real-time motion. The images will be recorded on a computer. The procedure may vary among health care providers and hospitals. What happens after the procedure?  You may return to your normal, everyday life, including diet, activities, and medicines, unless your health care provider  tells you not to do that. Summary  An echocardiogram is a procedure that uses painless sound waves (ultrasound) to produce an image of the heart.  Images from an echocardiogram can provide important information about the size and shape of your heart, heart muscle function, heart valve function, and fluid buildup around your heart.  You do not need to do anything to prepare before this procedure. You may eat and drink normally.  After the echocardiogram is completed, you may return to your normal, everyday life, unless your health care provider tells you not to do that. This information is not intended to replace advice given to you by your health care provider. Make sure you discuss any questions you have with your health care provider. Document Released: 03/27/2000 Document Revised: 07/21/2018 Document Reviewed: 05/02/2016 Elsevier Patient Education  [place].

## 2019-01-25 ENCOUNTER — Encounter: Payer: Self-pay | Admitting: *Deleted

## 2019-01-27 ENCOUNTER — Telehealth: Payer: Self-pay | Admitting: Cardiology

## 2019-01-27 NOTE — Telephone Encounter (Signed)
Please call regarding fluid pill

## 2019-01-27 NOTE — Telephone Encounter (Signed)
Called patient who wanted to clarify if he should be increasing his fluid intake after starting furosemide 40 mg twice daily during his office on 01/24/2019. Advised patient after referring to Dr. Terrial Rhodes office note that he should not increase his fluid intake since he is already in fluid overload due to congestive heart failure as we are trying to diurese him with the furosemide that Dr. Harriet Masson prescribed. Patient is agreeable and verbalized understanding. No further questions.

## 2019-01-31 ENCOUNTER — Other Ambulatory Visit: Payer: Self-pay

## 2019-01-31 ENCOUNTER — Ambulatory Visit (INDEPENDENT_AMBULATORY_CARE_PROVIDER_SITE_OTHER): Payer: Medicare Other | Admitting: Cardiology

## 2019-01-31 ENCOUNTER — Encounter: Payer: Self-pay | Admitting: Cardiology

## 2019-01-31 VITALS — BP 140/70 | HR 83 | Ht >= 80 in | Wt >= 6400 oz

## 2019-01-31 DIAGNOSIS — I509 Heart failure, unspecified: Secondary | ICD-10-CM | POA: Diagnosis not present

## 2019-01-31 DIAGNOSIS — I10 Essential (primary) hypertension: Secondary | ICD-10-CM | POA: Diagnosis not present

## 2019-01-31 DIAGNOSIS — Z6841 Body Mass Index (BMI) 40.0 and over, adult: Secondary | ICD-10-CM

## 2019-01-31 NOTE — Progress Notes (Signed)
Cardiology Office Note:    Date:  01/31/2019   ID:  Charles Gray, DOB 12-09-1955, MRN [id number]  PCP:  Enid Skeens., MD  Cardiologist:  No primary care provider on file.  Electrophysiologist:  None   Referring MD: Enid Skeens., MD   Follow-up visit  History of Present Illness:    Charles Gray is a 63 y.o. male with a hx of hypertension, hyperlipidemia, morbid obesity, history of nonalcoholic fatty liver disease.  Patient was initially seen by me on January 24, 2019 at which time he complained of shortness of breath and fatigue.  He did tell me that he was experiencing orthopnea and PND.  Therefore at the conclusion of the visit start patient on Lasix 40 mg every 12 hours with potassium 20 mEq daily.  An echocardiogram was also ordered and patient was referred to GI for his alcoholic fatty liver disease.  He tells me that he has been taking the Lasix as prescribed.  He has scheduled to see GI early next month.  And his transthoracic echocardiogram scheduled for February 14, 2019.  Past Medical History:  Diagnosis Date  . Fatty liver disease, nonalcoholic   . Hyperlipidemia 01/24/2019  . Hypertension 01/24/2019  . Insomnia 01/24/2019  . Lumbar disc herniation 01/24/2019    Past Surgical History:  Procedure Laterality Date  . HEMORROIDECTOMY      Current Medications: Current Meds  Medication Sig  . buprenorphine-naloxone (SUBOXONE) 8-2 mg SUBL SL tablet Place 2 tablets under the tongue daily.   . furosemide (LASIX) 40 MG tablet Take 1 tablet (40 mg total) by mouth 2 (two) times daily.  Marland Kitchen lisinopril (ZESTRIL) 30 MG tablet 30 mg daily.  . potassium chloride SA (KLOR-CON) 20 MEQ tablet 20 mEq daily.  . pravastatin (PRAVACHOL) 20 MG tablet 20 mg daily.  . VENTOLIN HFA 108 (90 Base) MCG/ACT inhaler   . zolpidem (AMBIEN) 10 MG tablet 10 mg at bedtime as needed.     Allergies:   Penicillins   Social History   Socioeconomic History  . Marital status: Unknown    Spouse name: Not on file  . Number of children: Not on file  . Years of education: Not on file  . Highest education level: Not on file  Occupational History  . Not on file  Social Needs  . Financial resource strain: Not on file  . Food insecurity    Worry: Not on file    Inability: Not on file  . Transportation needs    Medical: Not on file    Non-medical: Not on file  Tobacco Use  . Smoking status: Never Smoker  . Smokeless tobacco: Never Used  Substance and Sexual Activity  . Alcohol use: Never    Frequency: Never  . Drug use: Not Currently    Types: Marijuana    Comment: Smoked for 30 years  . Sexual activity: Not on file  Lifestyle  . Physical activity    Days per week: Not on file    Minutes per session: Not on file  . Stress: Not on file  Relationships  . Social Herbalist on phone: Not on file    Gets together: Not on file    Attends religious service: Not on file    Active member of club or organization: Not on file    Attends meetings of clubs or organizations: Not on file    Relationship status: Not on file  Other Topics Concern  . Not  on file  Social History Narrative  . Not on file     Family History: The patient's family history includes CAD in his maternal grandfather and maternal grandmother; Hyperlipidemia in his mother; Hypertension in his mother; Stroke in his mother.  ROS:   Review of Systems  Constitution: Negative for decreased appetite, fever and weight gain.  HENT: Negative for congestion, ear discharge, hoarse voice and sore throat.   Eyes: Negative for discharge, redness, vision loss in right eye and visual halos.  Cardiovascular: Reports shortness of breath however improving.  Negative for chest pain, leg swelling, orthopnea and palpitations.  Respiratory: Negative for cough, hemoptysis,snoring.   Endocrine: Negative for heat intolerance and polyphagia.  Hematologic/Lymphatic: Negative for bleeding problem. Does not  bruise/bleed easily.  Skin: Negative for flushing, nail changes, rash and suspicious lesions.  Musculoskeletal: Negative for arthritis, joint pain, muscle cramps, myalgias, neck pain and stiffness.  Gastrointestinal: Negative for abdominal pain, bowel incontinence, diarrhea and excessive appetite.  Genitourinary: Negative for decreased libido, genital sores and incomplete emptying.  Neurological: Negative for brief paralysis, focal weakness, headaches and loss of balance.  Psychiatric/Behavioral: Negative for altered mental status, depression and suicidal ideas.  Allergic/Immunologic: Negative for HIV exposure and persistent infections.    EKGs/Labs/Other Studies Reviewed:    The following studies were reviewed today:   EKG: None performed today.  Recent Labs: 01/24/2019: ALT 30; BUN 18; Creatinine, Ser 1.08; Magnesium 2.2; NT-Pro BNP 52; Potassium 4.2; Sodium 137  Recent Lipid Panel    Component Value Date/Time   CHOL 128 01/24/2019 1121   TRIG 121 01/24/2019 1121   HDL 42 01/24/2019 1121   CHOLHDL 3.0 01/24/2019 1121   LDLCALC 64 01/24/2019 1121    Physical Exam:    VS:  BP 140/70   Pulse 83   Ht 6' 10.8" (2.103 m)   Wt (!) 405 lb (183.7 kg)   SpO2 96%   BMI 41.53 kg/m     Wt Readings from Last 3 Encounters:  01/31/19 (!) 405 lb (183.7 kg)  01/24/19 (!) 410 lb (186 kg)     GEN: Well nourished, well developed in no acute distress HEENT: Normal NECK: No JVD; No carotid bruits LYMPHATICS: No lymphadenopathy CARDIAC: S1S2 noted,RRR, no murmurs, rubs, gallops RESPIRATORY:  Clear to auscultation without rales, wheezing or rhonchi  ABDOMEN: Soft, non-tender, non-distended, +bowel sounds, no guarding. EXTREMITIES: No edema, No cyanosis, no clubbing MUSCULOSKELETAL:  No edema; No deformity  SKIN: Warm and dry NEUROLOGIC:  Alert and oriented x 3, non-focal PSYCHIATRIC:  Normal affect, good insight  ASSESSMENT:    1. Hypertension, unspecified type   2. Chronic  congestive heart failure, unspecified heart failure type ([place])   3. Morbid obesity with BMI of 40.0-44.9, adult [facility])    PLAN:    Mr. Samek is taking the Lasix 40 mg twice daily.  He has had 5 pound weight loss since her last visit 1 week ago this is.  He still does have substantial amount of pitting edema pretibially +2 bilaterally, and his symptoms have not fully resolved.  I discussed with patient that he may need IV Lasix which will involve going to the emergency department at [facility] and getting this done.  He has declined this for now and states that he prefers to continue with the p.o. medication.  I also explained to him why it is important as it will help [place] help him diurese effectively.  But for now he maintains that he will continue to take  the Lasix 40 mg twice daily along with the potassium. At this time, continue to the patient closely therefore I will see him back in 2 weeks.    Today blood work will be performed to assess kidney functions, as well as electrolytes.  The patient is in agreement with the above plan. The patient left the office in stable condition.  The patient will follow up in 2 weeks.   Medication Adjustments/Labs and Tests Ordered: Current medicines are reviewed at length with the patient today.  Concerns regarding medicines are outlined above.  Orders Placed This Encounter  Procedures  . Basic Metabolic Panel (BMET)  . Magnesium   No orders of the defined types were placed in this encounter.   Patient Instructions  Medication Instructions:  Your physician recommends that you continue on your current medications as directed. Please refer to the Current Medication list given to you today.  *If you need a refill on your cardiac medications before your next appointment, please call your pharmacy*  Lab Work: Your physician recommends that you return for lab work in: TODAY BMP,Magnesium  If you have labs (blood work) drawn today and your  tests are completely normal, you will receive your results only by: Marland Kitchen MyChart Message (if you have MyChart) OR . A paper copy in the mail If you have any lab test that is abnormal or we need to change your treatment, we will call you to review the results.  Testing/Procedures: None  Follow-Up: At [facility], you and your health needs are our priority.  As part of our continuing mission to provide you with exceptional heart care, we have created designated Provider Care Teams.  These Care Teams include your primary Cardiologist (physician) and Advanced Practice Providers (APPs -  Physician Assistants and Nurse Practitioners) who all work together to provide you with the care you need, when you need it.  Your next appointment:   2 weeks  The format for your next appointment:   In Person  Provider:   Berniece Salines, DO  Other Instructions      Adopting a Healthy Lifestyle.  Know what a healthy weight is for you (roughly BMI <25) and aim to maintain this   Aim for 7+ servings of fruits and vegetables daily   65-80+ fluid ounces of water or unsweet tea for healthy kidneys   Limit to max 1 drink of alcohol per day; avoid smoking/tobacco   Limit animal fats in diet for cholesterol and heart health - choose grass fed whenever available   Avoid highly processed foods, and foods high in saturated/trans fats   Aim for low stress - take time to unwind and care for your mental health   Aim for 150 min of moderate intensity exercise weekly for heart health, and weights twice weekly for bone health   Aim for 7-9 hours of sleep daily   When it comes to diets, agreement about the perfect plan isnt easy to find, even among the experts. Experts at the [employer] developed an idea known as the Healthy Eating Plate. Just imagine a plate divided into logical, healthy portions.   The emphasis is on diet quality:   Load up on vegetables and fruits - one-half of your  plate: Aim for color and variety, and remember that potatoes dont count.   Go for whole grains - one-quarter of your plate: Whole wheat, barley, wheat berries, quinoa, oats, brown rice, and foods made with them. If you want  pasta, go with whole wheat pasta.   Protein power - one-quarter of your plate: Fish, chicken, beans, and nuts are all healthy, versatile protein sources. Limit red meat.   The diet, however, does go beyond the plate, offering a few other suggestions.   Use healthy plant oils, such as olive, canola, soy, corn, sunflower and peanut. Check the labels, and avoid partially hydrogenated oil, which have unhealthy trans fats.   If youre thirsty, drink water. Coffee and tea are good in moderation, but skip sugary drinks and limit milk and dairy products to one or two daily servings.   The type of carbohydrate in the diet is more important than the amount. Some sources of carbohydrates, such as vegetables, fruits, whole grains, and beans-are healthier than others.   Finally, stay active  Signed, Berniece Salines, DO  01/31/2019 12:12 PM    [place]

## 2019-01-31 NOTE — Patient Instructions (Signed)
Medication Instructions:  Your physician recommends that you continue on your current medications as directed. Please refer to the Current Medication list given to you today.  *If you need a refill on your cardiac medications before your next appointment, please call your pharmacy*  Lab Work: Your physician recommends that you return for lab work in: TODAY BMP,Magnesium  If you have labs (blood work) drawn today and your tests are completely normal, you will receive your results only by: Marland Kitchen MyChart Message (if you have MyChart) OR . A paper copy in the mail If you have any lab test that is abnormal or we need to change your treatment, we will call you to review the results.  Testing/Procedures: None  Follow-Up: At [facility], you and your health needs are our priority.  As part of our continuing mission to provide you with exceptional heart care, we have created designated Provider Care Teams.  These Care Teams include your primary Cardiologist (physician) and Advanced Practice Providers (APPs -  Physician Assistants and Nurse Practitioners) who all work together to provide you with the care you need, when you need it.  Your next appointment:   2 weeks  The format for your next appointment:   In Person  Provider:   Berniece Salines, DO  Other Instructions

## 2019-02-01 LAB — MAGNESIUM: Magnesium: 2.1 mg/dL (ref 1.6–2.3)

## 2019-02-01 LAB — BASIC METABOLIC PANEL
BUN/Creatinine Ratio: 16 (ref 10–24)
BUN: 14 mg/dL (ref 8–27)
CO2: 25 mmol/L (ref 20–29)
Calcium: 9.2 mg/dL (ref 8.6–10.2)
Chloride: 100 mmol/L (ref 96–106)
Creatinine, Ser: 0.87 mg/dL (ref 0.76–1.27)
GFR calc Af Amer: 106 mL/min/{1.73_m2} (ref >59)
GFR calc non Af Amer: 92 mL/min/{1.73_m2} (ref >59)
Glucose: 98 mg/dL (ref 65–99)
Potassium: 4.1 mmol/L (ref 3.5–5.2)
Sodium: 139 mmol/L (ref 134–144)

## 2019-02-14 ENCOUNTER — Other Ambulatory Visit: Payer: Medicare Other

## 2019-02-15 ENCOUNTER — Ambulatory Visit: Payer: Medicare Other | Admitting: Cardiology

## 2019-02-22 DIAGNOSIS — K760 Fatty (change of) liver, not elsewhere classified: Secondary | ICD-10-CM | POA: Diagnosis not present

## 2019-02-22 DIAGNOSIS — K5900 Constipation, unspecified: Secondary | ICD-10-CM | POA: Diagnosis not present

## 2019-02-27 ENCOUNTER — Other Ambulatory Visit: Payer: Self-pay

## 2019-02-27 ENCOUNTER — Ambulatory Visit (INDEPENDENT_AMBULATORY_CARE_PROVIDER_SITE_OTHER): Payer: Medicare Other | Admitting: Cardiology

## 2019-02-27 ENCOUNTER — Encounter: Payer: Self-pay | Admitting: Cardiology

## 2019-02-27 VITALS — BP 156/84 | HR 78 | Ht >= 80 in | Wt >= 6400 oz

## 2019-02-27 DIAGNOSIS — I509 Heart failure, unspecified: Secondary | ICD-10-CM

## 2019-02-27 DIAGNOSIS — I10 Essential (primary) hypertension: Secondary | ICD-10-CM

## 2019-02-27 LAB — BASIC METABOLIC PANEL
BUN/Creatinine Ratio: 14 (ref 10–24)
BUN: 12 mg/dL (ref 8–27)
CO2: 22 mmol/L (ref 20–29)
Calcium: 9.4 mg/dL (ref 8.6–10.2)
Chloride: 102 mmol/L (ref 96–106)
Creatinine, Ser: 0.84 mg/dL (ref 0.76–1.27)
GFR calc Af Amer: 108 mL/min/{1.73_m2} (ref >59)
GFR calc non Af Amer: 93 mL/min/{1.73_m2} (ref >59)
Glucose: 99 mg/dL (ref 65–99)
Potassium: 4.1 mmol/L (ref 3.5–5.2)
Sodium: 146 mmol/L — ABNORMAL HIGH (ref 134–144)

## 2019-02-27 LAB — MAGNESIUM: Magnesium: 2.2 mg/dL (ref 1.6–2.3)

## 2019-02-27 LAB — PRO B NATRIURETIC PEPTIDE: NT-Pro BNP: 37 pg/mL (ref 0–210)

## 2019-02-27 MED ORDER — FUROSEMIDE 40 MG PO TABS: ORAL_TABLET | ORAL | 1 refills | Status: DC

## 2019-02-27 MED ORDER — METOLAZONE 5 MG PO TABS: ORAL_TABLET | ORAL | 1 refills | Status: DC

## 2019-02-27 NOTE — Progress Notes (Signed)
Cardiology Office Note:    Date:  02/27/2019   ID:  Charles Gray, DOB 11-Mar-1956, MRN [id number]  PCP:  Enid Skeens., MD  Cardiologist:  No primary care provider on file.  Electrophysiologist:  None   Referring MD: Enid Skeens., MD   Follow-up visit  History of Present Illness:    Charles Gray is a 63 y.o. male with a hx of hypertension, hyperlipidemia, morbid obesity, history of nonalcoholic fatty liver disease.  Patient was initially seen by me on January 24, 2019 at which time he complained of shortness of breath and fatigue.  He did tell me that he was experiencing orthopnea and PND.  Therefore at the conclusion of the visit start patient on Lasix 40 mg every 12 hours with potassium 20 mEq daily.  An echocardiogram was also ordered and patient was referred to GI for his alcoholic fatty liver disease.  In the interim I did see the patient, he appears to be losing weight on the diuretic therapy.  At his initial visit he was 64 LBS and his visit on January 31, 2019 he was 405 pounds.  Although the weight loss the patient still was not euvolemic therefore recommended the patient go to the ED at the [facility] to be given IV diuretics but he declined at that time.  He is here for follow-up visit.  He reports worsening shortness of breath on exertion.  Past Medical History:  Diagnosis Date   Fatty liver disease, nonalcoholic    Hyperlipidemia 01/24/2019   Hypertension 01/24/2019   Insomnia 01/24/2019   Lumbar disc herniation 01/24/2019    Past Surgical History:  Procedure Laterality Date   HEMORROIDECTOMY      Current Medications: Current Meds  Medication Sig   buprenorphine-naloxone (SUBOXONE) 8-2 mg SUBL SL tablet Place 2 tablets under the tongue daily.    furosemide (LASIX) 40 MG tablet Take 2 tabs (80 mg ) in the Am and 1 tab (40 Mg) in the PM   lisinopril (ZESTRIL) 30 MG tablet 30 mg daily.   potassium chloride SA (KLOR-CON) 20 MEQ  tablet 20 mEq daily.   pravastatin (PRAVACHOL) 20 MG tablet 20 mg daily.   VENTOLIN HFA 108 (90 Base) MCG/ACT inhaler    zolpidem (AMBIEN) 10 MG tablet 10 mg at bedtime as needed.   [DISCONTINUED] furosemide (LASIX) 40 MG tablet Take 1 tablet (40 mg total) by mouth 2 (two) times daily.     Allergies:   Penicillins   Social History   Socioeconomic History   Marital status: Unknown    Spouse name: Not on file   Number of children: Not on file   Years of education: Not on file   Highest education level: Not on file  Occupational History   Not on file  Social Needs   Financial resource strain: Not on file   Food insecurity    Worry: Not on file    Inability: Not on file   Transportation needs    Medical: Not on file    Non-medical: Not on file  Tobacco Use   Smoking status: Never Smoker   Smokeless tobacco: Never Used  Substance and Sexual Activity   Alcohol use: Never    Frequency: Never   Drug use: Not Currently    Types: Marijuana    Comment: Smoked for 30 years   Sexual activity: Not on file  Lifestyle   Physical activity    Days per week: Not on file  Minutes per session: Not on file   Stress: Not on file  Relationships   Social connections    Talks on phone: Not on file    Gets together: Not on file    Attends religious service: Not on file    Active member of club or organization: Not on file    Attends meetings of clubs or organizations: Not on file    Relationship status: Not on file  Other Topics Concern   Not on file  Social History Narrative   Not on file     Family History: The patient's family history includes CAD in his maternal grandfather and maternal grandmother; Hyperlipidemia in his mother; Hypertension in his mother; Stroke in his mother.  ROS:   Review of Systems  Constitution: Negative for decreased appetite, fever and weight gain.  HENT: Negative for congestion, ear discharge, hoarse voice and sore throat.     Eyes: Negative for discharge, redness, vision loss in right eye and visual halos.  Cardiovascular: Reports shortness of breath.  Negative for chest pain,Leg swelling, orthopnea and palpitations.  Respiratory: Negative for cough, hemoptysis, shortness of breath and snoring.   Endocrine: Negative for heat intolerance and polyphagia.  Hematologic/Lymphatic: Negative for bleeding problem. Does not bruise/bleed easily.  Skin: Negative for flushing, nail changes, rash and suspicious lesions.  Musculoskeletal: Negative for arthritis, joint pain, muscle cramps, myalgias, neck pain and stiffness.  Gastrointestinal: Negative for abdominal pain, bowel incontinence, diarrhea and excessive appetite.  Genitourinary: Negative for decreased libido, genital sores and incomplete emptying.  Neurological: Negative for brief paralysis, focal weakness, headaches and loss of balance.  Psychiatric/Behavioral: Negative for altered mental status, depression and suicidal ideas.  Allergic/Immunologic: Negative for HIV exposure and persistent infections.    EKGs/Labs/Other Studies Reviewed:    The following studies were reviewed today:   EKG:  The ekg ordered today demonstrates   Recent Labs: 01/24/2019: ALT 30; NT-Pro BNP 52 01/31/2019: BUN 14; Creatinine, Ser 0.87; Magnesium 2.1; Potassium 4.1; Sodium 139  Recent Lipid Panel    Component Value Date/Time   CHOL 128 01/24/2019 1121   TRIG 121 01/24/2019 1121   HDL 42 01/24/2019 1121   CHOLHDL 3.0 01/24/2019 1121   LDLCALC 64 01/24/2019 1121    Physical Exam:    VS:  BP (!) 156/84    Pulse 78    Ht 6' 10"  (2.083 m)    Wt (!) 414 lb 12.8 oz (188.2 kg)    BMI 43.37 kg/m     Wt Readings from Last 3 Encounters:  02/27/19 (!) 414 lb 12.8 oz (188.2 kg)  01/31/19 (!) 405 lb (183.7 kg)  01/24/19 (!) 410 lb (186 kg)     GEN: Well nourished, well developed in no acute distress HEENT: Normal NECK: No JVD; No carotid bruits LYMPHATICS: No  lymphadenopathy CARDIAC: S1S2 noted,RRR, no murmurs, rubs, gallops RESPIRATORY: Decreased breath sound due to body habitus   ABDOMEN: Soft, non-tender, non-distended, +bowel sounds, no guarding. EXTREMITIES:Bilateral +3 pitting edema ( tree trunk), No cyanosis, no clubbing MUSCULOSKELETAL:   No deformity  SKIN: Warm and dry NEUROLOGIC:  Alert and oriented x 3, non-focal PSYCHIATRIC:  Normal affect, good insight  ASSESSMENT:    1. Hypertension, unspecified type   2. Heart failure, type unknown ([place])    PLAN:    1.  His physical exam is worse than his last visit.  His pitting edema has worsened with anasarca, symptoms of shortness of breath and orthopnea is worrisome.  He also has  gained weight when the diuretics.  I have again urged the patient that we will be of great benefit to him to please go to the ED for IV diuretics.  Ideally he will probably require a couple days of IV diuretics to improve bilateral leg edema and symptoms as his p.o. diuretics has failed.  At this time he is refusing to go to the ED and prefers to continue on his p.o. diuretics.  At this point I have no choice, therefore I will increase his diuretics to Lasix 80 mg in the morning, 40 mg at night and I will also be adding metolazone 5 mg prior to his Lasix dosing to help with effective diuresis.  Blood work will be done today for BMP, mag as well as proBNP.   2.  His echocardiogram is still pending which is scheduled for March 31, 2019.  3.  He is hypertensive in the office today he admits to taking his medication for giving his hypervolemic state which I believe is driving his elevated blood pressure.     The patient is in agreement with the above plan. The patient left the office in stable condition.  The patient will follow up in 2 weeks.   Medication Adjustments/Labs and Tests Ordered: Current medicines are reviewed at length with the patient today.  Concerns regarding medicines are outlined above.  Orders  Placed This Encounter  Procedures   Basic Metabolic Panel (BMET)   Magnesium   Pro b natriuretic peptide   Meds ordered this encounter  Medications   metolazone (ZAROXOLYN) 5 MG tablet    Sig: Take 1 tab daily 3 0 minutes prior to lasix    Dispense:  90 tablet    Refill:  1   furosemide (LASIX) 40 MG tablet    Sig: Take 2 tabs (80 mg ) in the Am and 1 tab (40 Mg) in the PM    Dispense:  180 tablet    Refill:  1    Patient Instructions  Medication Instructions:  Your physician has recommended you make the following change in your medication:   START: Metolazone 5 mg Take 1 tab 30 minutes prior to Lasix  INCREASE: Furosemide (Lasix) to 2 tabs (80 mg) in the AM and 1 tab (40 mg) in the PM  *If you need a refill on your cardiac medications before your next appointment, please call your pharmacy*  Lab Work: Your physician recommends that you return for lab work in: TODAY BMP,Magnesium, Pro BNP  If you have labs (blood work) drawn today and your tests are completely normal, you will receive your results only by:  [place] (if you have MyChart) OR  A paper copy in the mail If you have any lab test that is abnormal or we need to change your treatment, we will call you to review the results.  Testing/Procedures: None  Follow-Up: At [facility], you and your health needs are our priority.  As part of our continuing mission to provide you with exceptional heart care, we have created designated Provider Care Teams.  These Care Teams include your primary Cardiologist (physician) and Advanced Practice Providers (APPs -  Physician Assistants and Nurse Practitioners) who all work together to provide you with the care you need, when you need it.  Your next appointment:   2 weeks  The format for your next appointment:   In Person  Provider:   Berniece Salines, DO  Other Instructions Metolazone tablets What is this medicine? METOLAZONE (  me TOLE a zone) is a diuretic.  It increases the amount of urine passed, which causes the body to lose salt and water. This medicine is used to treat high blood pressure. It is also reduces the swelling and water retention caused by heart or kidney disease. This medicine may be used for other purposes; ask your health care provider or pharmacist if you have questions. COMMON BRAND NAME(S): Mykrox, Zaroxolyn What should I tell my health care provider before I take this medicine? They need to know if you have any of these conditions:  diabetes  gout  immune system problems, like lupus  kidney disease  liver disease  pancreatitis  small amount of urine or difficulty passing urine  an unusual or allergic reaction to metolazone, sulfa drugs, other medicines, foods, dyes, or preservatives  pregnant or trying to get pregnant  breast-feeding How should I use this medicine? Take this medicine by mouth with a glass of water. Follow the directions on the prescription label. Remember that you will need to pass urine frequently after taking this medicine. Do not take your doses at a time of day that will cause you problems. Do not take at bedtime. Take your medicine at regular intervals. Do not take your medicine more often than directed. Do not stop taking except on your doctor's advice. Talk to your pediatrician regarding the use of this medicine in children. Special care may be needed. Overdosage: If you think you have taken too much of this medicine contact a poison control center or emergency room at once. NOTE: This medicine is only for you. Do not share this medicine with others. What if I miss a dose? If you miss a dose, take it as soon as you can. If it is almost time for your next dose, take only that dose. Do not take double or extra doses. What may interact with this medicine?  alcohol  antiinflammatory drugs for pain or swelling  barbiturates for sleep or seizure  control  digoxin  dofetilide  lithium  medicines for blood sugar  medicines for high blood pressure  medicines that relax muscles for surgery  methenamine  other diuretics  some medicines for pain  steroid hormones like cortisone, hydrocortisone, and prednisone  warfarin This list may not describe all possible interactions. Give your health care provider a list of all the medicines, herbs, non-prescription drugs, or dietary supplements you use. Also tell them if you smoke, drink alcohol, or use illegal drugs. Some items may interact with your medicine. What should I watch for while using this medicine? Visit your doctor or health care professional for regular checks on your progress. Check your blood pressure as directed. Ask your doctor or health care professional what your blood pressure should be and when you should contact him or her. You may need to be on a special diet while taking this medicine. Ask your doctor. Check with your doctor or health care professional if you get an attack of severe diarrhea, nausea and vomiting, or if you sweat a lot. The loss of too much body fluid can make it dangerous for you to take this medicine. You may get drowsy or dizzy. Do not drive, use machinery, or do anything that needs mental alertness until you know how this medicine affects you. Do not stand or sit up quickly, especially if you are an older patient. This reduces the risk of dizzy or fainting spells. Alcohol may interfere with the effect of this medicine. Avoid alcoholic drinks.  This medicine may affect your blood sugar level. If you have diabetes, check with your doctor or health care professional before changing the dose of your diabetic medicine. This medicine can make you more sensitive to the sun. Keep out of the sun. If you cannot avoid being in the sun, wear protective clothing and use sunscreen. Do not use sun lamps or tanning beds/booths. What side effects may I notice from  receiving this medicine? Side effects that you should report to your doctor or health care professional as soon as possible:  allergic reactions such as skin rash or itching, hives, swelling of the lips, mouth, tongue, or throat  fast or irregular heartbeat, chest pain  feeling faint  fever, chills  gout pain  hot red lump on leg  muscle pain, cramps  nausea, vomiting  numbness or tingling in hands, feet  pain or difficulty when passing urine  redness, blistering, peeling or loosening of the skin, including inside the mouth  unusual bleeding or bruising  unusually weak or tired  yellowing of the eyes, skin Side effects that usually do not require medical attention (report to your doctor or health care professional if they continue or are bothersome):  abdominal pain  blurred vision  constipation or diarrhea  dry mouth  headache This list may not describe all possible side effects. Call your doctor for medical advice about side effects. You may report side effects to FDA at 1-800-FDA-1088. Where should I keep my medicine? Keep out of the reach of children. Store at room temperature between 15 and 30 degrees C (59 and 86 degrees F). Protect from light. Keep container tightly closed. Throw away any unused medicine after the expiration date. NOTE: This sheet is a summary. It may not cover all possible information. If you have questions about this medicine, talk to your doctor, pharmacist, or health care provider.  2020 Elsevier/Gold Standard (2007-10-17 14:11:48)      Adopting a Healthy Lifestyle.  Know what a healthy weight is for you (roughly BMI <25) and aim to maintain this   Aim for 7+ servings of fruits and vegetables daily   65-80+ fluid ounces of water or unsweet tea for healthy kidneys   Limit to max 1 drink of alcohol per day; avoid smoking/tobacco   Limit animal fats in diet for cholesterol and heart health - choose grass fed whenever available    Avoid highly processed foods, and foods high in saturated/trans fats   Aim for low stress - take time to unwind and care for your mental health   Aim for 150 min of moderate intensity exercise weekly for heart health, and weights twice weekly for bone health   Aim for 7-9 hours of sleep daily   When it comes to diets, agreement about the perfect plan isnt easy to find, even among the experts. Experts at the [employer] developed an idea known as the Healthy Eating Plate. Just imagine a plate divided into logical, healthy portions.   The emphasis is on diet quality:   Load up on vegetables and fruits - one-half of your plate: Aim for color and variety, and remember that potatoes dont count.   Go for whole grains - one-quarter of your plate: Whole wheat, barley, wheat berries, quinoa, oats, brown rice, and foods made with them. If you want pasta, go with whole wheat pasta.   Protein power - one-quarter of your plate: Fish, chicken, beans, and nuts are all healthy, versatile protein sources.  Limit red meat.   The diet, however, does go beyond the plate, offering a few other suggestions.   Use healthy plant oils, such as olive, canola, soy, corn, sunflower and peanut. Check the labels, and avoid partially hydrogenated oil, which have unhealthy trans fats.   If youre thirsty, drink water. Coffee and tea are good in moderation, but skip sugary drinks and limit milk and dairy products to one or two daily servings.   The type of carbohydrate in the diet is more important than the amount. Some sources of carbohydrates, such as vegetables, fruits, whole grains, and beans-are healthier than others.   Finally, stay active  Signed, Berniece Salines, DO  02/27/2019 2:39 PM    [place]

## 2019-02-27 NOTE — Patient Instructions (Signed)
Medication Instructions:  Your physician has recommended you make the following change in your medication:   START: Metolazone 5 mg Take 1 tab 30 minutes prior to Lasix  INCREASE: Furosemide (Lasix) to 2 tabs (80 mg) in the AM and 1 tab (40 mg) in the PM  *If you need a refill on your cardiac medications before your next appointment, please call your pharmacy*  Lab Work: Your physician recommends that you return for lab work in: TODAY BMP,Magnesium, Pro BNP  If you have labs (blood work) drawn today and your tests are completely normal, you will receive your results only by: [place] MyChart Message (if you have MyChart) OR . A paper copy in the mail If you have any lab test that is abnormal or we need to change your treatment, we will call you to review the results.  Testing/Procedures: None  Follow-Up: At [facility], you and your health needs are our priority.  As part of our continuing mission to provide you with exceptional heart care, we have created designated Provider Care Teams.  These Care Teams include your primary Cardiologist (physician) and Advanced Practice Providers (APPs -  Physician Assistants and Nurse Practitioners) who all work together to provide you with the care you need, when you need it.  Your next appointment:   2 weeks  The format for your next appointment:   In Person  Provider:   Berniece Salines, DO  Other Instructions Metolazone tablets What is this medicine? METOLAZONE (me TOLE a zone) is a diuretic. It increases the amount of urine passed, which causes the body to lose salt and water. This medicine is used to treat high blood pressure. It is also reduces the swelling and water retention caused by heart or kidney disease. This medicine may be used for other purposes; ask your health care provider or pharmacist if you have questions. COMMON BRAND NAME(S): Mykrox, Zaroxolyn What should I tell my health care provider before I take this medicine? They need  to know if you have any of these conditions:  diabetes  gout  immune system problems, like lupus  kidney disease  liver disease  pancreatitis  small amount of urine or difficulty passing urine  an unusual or allergic reaction to metolazone, sulfa drugs, other medicines, foods, dyes, or preservatives  pregnant or trying to get pregnant  breast-feeding How should I use this medicine? Take this medicine by mouth with a glass of water. Follow the directions on the prescription label. Remember that you will need to pass urine frequently after taking this medicine. Do not take your doses at a time of day that will cause you problems. Do not take at bedtime. Take your medicine at regular intervals. Do not take your medicine more often than directed. Do not stop taking except on your doctor's advice. Talk to your pediatrician regarding the use of this medicine in children. Special care may be needed. Overdosage: If you think you have taken too much of this medicine contact a poison control center or emergency room at once. NOTE: This medicine is only for you. Do not share this medicine with others. What if I miss a dose? If you miss a dose, take it as soon as you can. If it is almost time for your next dose, take only that dose. Do not take double or extra doses. What may interact with this medicine?  alcohol  antiinflammatory drugs for pain or swelling  barbiturates for sleep or seizure control  digoxin  dofetilide  lithium  medicines for blood sugar  medicines for high blood pressure  medicines that relax muscles for surgery  methenamine  other diuretics  some medicines for pain  steroid hormones like cortisone, hydrocortisone, and prednisone  warfarin This list may not describe all possible interactions. Give your health care provider a list of all the medicines, herbs, non-prescription drugs, or dietary supplements you use. Also tell them if you smoke, drink  alcohol, or use illegal drugs. Some items may interact with your medicine. What should I watch for while using this medicine? Visit your doctor or health care professional for regular checks on your progress. Check your blood pressure as directed. Ask your doctor or health care professional what your blood pressure should be and when you should contact him or her. You may need to be on a special diet while taking this medicine. Ask your doctor. Check with your doctor or health care professional if you get an attack of severe diarrhea, nausea and vomiting, or if you sweat a lot. The loss of too much body fluid can make it dangerous for you to take this medicine. You may get drowsy or dizzy. Do not drive, use machinery, or do anything that needs mental alertness until you know how this medicine affects you. Do not stand or sit up quickly, especially if you are an older patient. This reduces the risk of dizzy or fainting spells. Alcohol may interfere with the effect of this medicine. Avoid alcoholic drinks. This medicine may affect your blood sugar level. If you have diabetes, check with your doctor or health care professional before changing the dose of your diabetic medicine. This medicine can make you more sensitive to the sun. Keep out of the sun. If you cannot avoid being in the sun, wear protective clothing and use sunscreen. Do not use sun lamps or tanning beds/booths. What side effects may I notice from receiving this medicine? Side effects that you should report to your doctor or health care professional as soon as possible:  allergic reactions such as skin rash or itching, hives, swelling of the lips, mouth, tongue, or throat  fast or irregular heartbeat, chest pain  feeling faint  fever, chills  gout pain  hot red lump on leg  muscle pain, cramps  nausea, vomiting  numbness or tingling in hands, feet  pain or difficulty when passing urine  redness, blistering, peeling or  loosening of the skin, including inside the mouth  unusual bleeding or bruising  unusually weak or tired  yellowing of the eyes, skin Side effects that usually do not require medical attention (report to your doctor or health care professional if they continue or are bothersome):  abdominal pain  blurred vision  constipation or diarrhea  dry mouth  headache This list may not describe all possible side effects. Call your doctor for medical advice about side effects. You may report side effects to FDA at 1-800-FDA-1088. Where should I keep my medicine? Keep out of the reach of children. Store at room temperature between 15 and 30 degrees C (59 and 86 degrees F). Protect from light. Keep container tightly closed. Throw away any unused medicine after the expiration date. NOTE: This sheet is a summary. It may not cover all possible information. If you have questions about this medicine, talk to your doctor, pharmacist, or health care provider.  2020 Elsevier/Gold Standard (2007-10-17 14:11:48)

## 2019-03-02 ENCOUNTER — Telehealth: Payer: Self-pay | Admitting: Medical

## 2019-03-02 ENCOUNTER — Telehealth: Payer: Self-pay | Admitting: Cardiology

## 2019-03-02 DIAGNOSIS — R778 Other specified abnormalities of plasma proteins: Secondary | ICD-10-CM | POA: Diagnosis not present

## 2019-03-02 DIAGNOSIS — I509 Heart failure, unspecified: Secondary | ICD-10-CM | POA: Diagnosis not present

## 2019-03-02 DIAGNOSIS — R0602 Shortness of breath: Secondary | ICD-10-CM | POA: Diagnosis not present

## 2019-03-02 DIAGNOSIS — Z03818 Encounter for observation for suspected exposure to other biological agents ruled out: Secondary | ICD-10-CM | POA: Diagnosis not present

## 2019-03-02 DIAGNOSIS — R0600 Dyspnea, unspecified: Secondary | ICD-10-CM | POA: Diagnosis not present

## 2019-03-02 DIAGNOSIS — R079 Chest pain, unspecified: Secondary | ICD-10-CM | POA: Diagnosis not present

## 2019-03-02 DIAGNOSIS — I110 Hypertensive heart disease with heart failure: Secondary | ICD-10-CM | POA: Diagnosis not present

## 2019-03-02 DIAGNOSIS — R600 Localized edema: Secondary | ICD-10-CM | POA: Diagnosis not present

## 2019-03-02 DIAGNOSIS — I447 Left bundle-branch block, unspecified: Secondary | ICD-10-CM | POA: Diagnosis not present

## 2019-03-02 NOTE — Telephone Encounter (Signed)
Please let him know that he needs to go the hospital.  He needs IV diuretics as well as blood work for the diuretic use. We can have him get the BMP and mag here but tell him that I need him to go to the hospital because he needs IV diuretics he is not going to get better if he does not do this.

## 2019-03-02 NOTE — Telephone Encounter (Signed)
Telephone call to patient. States having cramping in his legs since 2 this AM. Legs are less swollen but still SOB. Taking Lasix 80 mg AM,40 mg PM along with metalozone 5 mg 30 minutes prior to lasix and potassium 20 meq daily. Inquired if he would be willing to go to hospital but he said no. Please advise.

## 2019-03-02 NOTE — Telephone Encounter (Signed)
Telephone call to patient. Informed patient he needs to go to [facility] for IV diuretics(lasix). Patient very hesitant but he spoke personally with Dr Harriet Masson and agreed to go when son gets home.

## 2019-03-02 NOTE — Telephone Encounter (Signed)
I received an outpatient page regarding patient's care in the ER. There was not a lot of detail in the page. It looks like you had recommended IV lasix as an inpatient. I called the number back twice but but no one answered.    Charles Gray Charles Mody, PA-C

## 2019-03-02 NOTE — Telephone Encounter (Signed)
Something has come up that he needs to talk to you about (wouldn't say what)

## 2019-03-02 NOTE — Telephone Encounter (Signed)
Thank you. I was able to speak with the ED.

## 2019-03-03 DIAGNOSIS — I509 Heart failure, unspecified: Secondary | ICD-10-CM | POA: Diagnosis not present

## 2019-03-03 DIAGNOSIS — N179 Acute kidney failure, unspecified: Secondary | ICD-10-CM | POA: Diagnosis not present

## 2019-03-03 DIAGNOSIS — K922 Gastrointestinal hemorrhage, unspecified: Secondary | ICD-10-CM | POA: Diagnosis not present

## 2019-03-03 DIAGNOSIS — E662 Morbid (severe) obesity with alveolar hypoventilation: Secondary | ICD-10-CM | POA: Diagnosis present

## 2019-03-03 DIAGNOSIS — E873 Alkalosis: Secondary | ICD-10-CM | POA: Diagnosis not present

## 2019-03-03 DIAGNOSIS — R079 Chest pain, unspecified: Secondary | ICD-10-CM | POA: Diagnosis not present

## 2019-03-03 DIAGNOSIS — I119 Hypertensive heart disease without heart failure: Secondary | ICD-10-CM | POA: Diagnosis not present

## 2019-03-03 DIAGNOSIS — R601 Generalized edema: Secondary | ICD-10-CM | POA: Diagnosis not present

## 2019-03-03 DIAGNOSIS — I110 Hypertensive heart disease with heart failure: Secondary | ICD-10-CM | POA: Diagnosis not present

## 2019-03-03 DIAGNOSIS — Z9189 Other specified personal risk factors, not elsewhere classified: Secondary | ICD-10-CM | POA: Diagnosis not present

## 2019-03-03 DIAGNOSIS — K7460 Unspecified cirrhosis of liver: Secondary | ICD-10-CM | POA: Diagnosis present

## 2019-03-03 DIAGNOSIS — I10 Essential (primary) hypertension: Secondary | ICD-10-CM | POA: Diagnosis present

## 2019-03-03 DIAGNOSIS — R578 Other shock: Secondary | ICD-10-CM | POA: Diagnosis not present

## 2019-03-03 DIAGNOSIS — R0602 Shortness of breath: Secondary | ICD-10-CM | POA: Diagnosis not present

## 2019-03-03 DIAGNOSIS — E871 Hypo-osmolality and hyponatremia: Secondary | ICD-10-CM | POA: Diagnosis present

## 2019-03-03 DIAGNOSIS — M1990 Unspecified osteoarthritis, unspecified site: Secondary | ICD-10-CM | POA: Diagnosis present

## 2019-03-03 DIAGNOSIS — Z880 Allergy status to penicillin: Secondary | ICD-10-CM | POA: Diagnosis not present

## 2019-03-03 DIAGNOSIS — R0600 Dyspnea, unspecified: Secondary | ICD-10-CM | POA: Diagnosis present

## 2019-03-03 DIAGNOSIS — Z6841 Body Mass Index (BMI) 40.0 and over, adult: Secondary | ICD-10-CM | POA: Diagnosis not present

## 2019-03-03 DIAGNOSIS — Z79899 Other long term (current) drug therapy: Secondary | ICD-10-CM | POA: Diagnosis not present

## 2019-03-03 DIAGNOSIS — I517 Cardiomegaly: Secondary | ICD-10-CM | POA: Diagnosis not present

## 2019-03-03 DIAGNOSIS — D72829 Elevated white blood cell count, unspecified: Secondary | ICD-10-CM | POA: Diagnosis present

## 2019-03-03 DIAGNOSIS — K7689 Other specified diseases of liver: Secondary | ICD-10-CM | POA: Diagnosis not present

## 2019-03-03 DIAGNOSIS — Z03818 Encounter for observation for suspected exposure to other biological agents ruled out: Secondary | ICD-10-CM | POA: Diagnosis not present

## 2019-03-03 DIAGNOSIS — R778 Other specified abnormalities of plasma proteins: Secondary | ICD-10-CM | POA: Diagnosis not present

## 2019-03-03 DIAGNOSIS — R571 Hypovolemic shock: Secondary | ICD-10-CM | POA: Diagnosis not present

## 2019-03-03 DIAGNOSIS — M5136 Other intervertebral disc degeneration, lumbar region: Secondary | ICD-10-CM | POA: Diagnosis present

## 2019-03-03 DIAGNOSIS — J45909 Unspecified asthma, uncomplicated: Secondary | ICD-10-CM | POA: Diagnosis present

## 2019-03-03 DIAGNOSIS — K760 Fatty (change of) liver, not elsewhere classified: Secondary | ICD-10-CM | POA: Diagnosis present

## 2019-03-03 DIAGNOSIS — D62 Acute posthemorrhagic anemia: Secondary | ICD-10-CM | POA: Diagnosis not present

## 2019-03-03 DIAGNOSIS — R600 Localized edema: Secondary | ICD-10-CM | POA: Diagnosis not present

## 2019-03-03 DIAGNOSIS — I447 Left bundle-branch block, unspecified: Secondary | ICD-10-CM | POA: Diagnosis not present

## 2019-03-03 DIAGNOSIS — E8809 Other disorders of plasma-protein metabolism, not elsewhere classified: Secondary | ICD-10-CM | POA: Diagnosis present

## 2019-03-03 DIAGNOSIS — E785 Hyperlipidemia, unspecified: Secondary | ICD-10-CM | POA: Diagnosis not present

## 2019-03-03 DIAGNOSIS — E872 Acidosis: Secondary | ICD-10-CM | POA: Diagnosis not present

## 2019-03-04 ENCOUNTER — Inpatient Hospital Stay (HOSPITAL_COMMUNITY)
Admission: AD | Admit: 2019-03-04 | Discharge: 2019-03-13 | DRG: 871 | Disposition: A | Discharge status: Rehabilitation Facility | Payer: Medicare Other | Source: Other Acute Inpatient Hospital | Attending: Internal Medicine | Admitting: Internal Medicine

## 2019-03-04 ENCOUNTER — Inpatient Hospital Stay (HOSPITAL_COMMUNITY): Payer: Medicare Other

## 2019-03-04 ENCOUNTER — Encounter (HOSPITAL_COMMUNITY): Payer: Self-pay | Admitting: Physician Assistant

## 2019-03-04 DIAGNOSIS — J9690 Respiratory failure, unspecified, unspecified whether with hypoxia or hypercapnia: Secondary | ICD-10-CM

## 2019-03-04 DIAGNOSIS — G9341 Metabolic encephalopathy: Secondary | ICD-10-CM | POA: Diagnosis not present

## 2019-03-04 DIAGNOSIS — Z8349 Family history of other endocrine, nutritional and metabolic diseases: Secondary | ICD-10-CM

## 2019-03-04 DIAGNOSIS — E876 Hypokalemia: Secondary | ICD-10-CM | POA: Diagnosis not present

## 2019-03-04 DIAGNOSIS — G8929 Other chronic pain: Secondary | ICD-10-CM | POA: Diagnosis present

## 2019-03-04 DIAGNOSIS — K760 Fatty (change of) liver, not elsewhere classified: Secondary | ICD-10-CM | POA: Diagnosis present

## 2019-03-04 DIAGNOSIS — E874 Mixed disorder of acid-base balance: Secondary | ICD-10-CM | POA: Diagnosis present

## 2019-03-04 DIAGNOSIS — I10 Essential (primary) hypertension: Secondary | ICD-10-CM | POA: Diagnosis present

## 2019-03-04 DIAGNOSIS — I447 Left bundle-branch block, unspecified: Secondary | ICD-10-CM | POA: Diagnosis not present

## 2019-03-04 DIAGNOSIS — R079 Chest pain, unspecified: Secondary | ICD-10-CM | POA: Diagnosis not present

## 2019-03-04 DIAGNOSIS — Z452 Encounter for adjustment and management of vascular access device: Secondary | ICD-10-CM | POA: Diagnosis not present

## 2019-03-04 DIAGNOSIS — Z823 Family history of stroke: Secondary | ICD-10-CM | POA: Diagnosis not present

## 2019-03-04 DIAGNOSIS — M5126 Other intervertebral disc displacement, lumbar region: Secondary | ICD-10-CM | POA: Diagnosis present

## 2019-03-04 DIAGNOSIS — R578 Other shock: Secondary | ICD-10-CM

## 2019-03-04 DIAGNOSIS — N179 Acute kidney failure, unspecified: Secondary | ICD-10-CM

## 2019-03-04 DIAGNOSIS — A419 Sepsis, unspecified organism: Principal | ICD-10-CM | POA: Diagnosis present

## 2019-03-04 DIAGNOSIS — E872 Acidosis: Secondary | ICD-10-CM | POA: Diagnosis not present

## 2019-03-04 DIAGNOSIS — R0600 Dyspnea, unspecified: Secondary | ICD-10-CM

## 2019-03-04 DIAGNOSIS — R0602 Shortness of breath: Secondary | ICD-10-CM | POA: Diagnosis not present

## 2019-03-04 DIAGNOSIS — K7469 Other cirrhosis of liver: Secondary | ICD-10-CM | POA: Diagnosis not present

## 2019-03-04 DIAGNOSIS — R262 Difficulty in walking, not elsewhere classified: Secondary | ICD-10-CM | POA: Diagnosis not present

## 2019-03-04 DIAGNOSIS — K254 Chronic or unspecified gastric ulcer with hemorrhage: Secondary | ICD-10-CM | POA: Diagnosis present

## 2019-03-04 DIAGNOSIS — E8809 Other disorders of plasma-protein metabolism, not elsewhere classified: Secondary | ICD-10-CM | POA: Diagnosis not present

## 2019-03-04 DIAGNOSIS — E785 Hyperlipidemia, unspecified: Secondary | ICD-10-CM | POA: Diagnosis present

## 2019-03-04 DIAGNOSIS — K259 Gastric ulcer, unspecified as acute or chronic, without hemorrhage or perforation: Secondary | ICD-10-CM | POA: Diagnosis not present

## 2019-03-04 DIAGNOSIS — R778 Other specified abnormalities of plasma proteins: Secondary | ICD-10-CM | POA: Diagnosis not present

## 2019-03-04 DIAGNOSIS — D638 Anemia in other chronic diseases classified elsewhere: Secondary | ICD-10-CM | POA: Diagnosis not present

## 2019-03-04 DIAGNOSIS — R571 Hypovolemic shock: Secondary | ICD-10-CM | POA: Diagnosis present

## 2019-03-04 DIAGNOSIS — R932 Abnormal findings on diagnostic imaging of liver and biliary tract: Secondary | ICD-10-CM | POA: Diagnosis not present

## 2019-03-04 DIAGNOSIS — D72829 Elevated white blood cell count, unspecified: Secondary | ICD-10-CM | POA: Diagnosis not present

## 2019-03-04 DIAGNOSIS — G894 Chronic pain syndrome: Secondary | ICD-10-CM | POA: Diagnosis not present

## 2019-03-04 DIAGNOSIS — K3189 Other diseases of stomach and duodenum: Secondary | ICD-10-CM | POA: Diagnosis present

## 2019-03-04 DIAGNOSIS — K766 Portal hypertension: Secondary | ICD-10-CM | POA: Diagnosis present

## 2019-03-04 DIAGNOSIS — Z880 Allergy status to penicillin: Secondary | ICD-10-CM | POA: Diagnosis not present

## 2019-03-04 DIAGNOSIS — M792 Neuralgia and neuritis, unspecified: Secondary | ICD-10-CM | POA: Diagnosis not present

## 2019-03-04 DIAGNOSIS — K5903 Drug induced constipation: Secondary | ICD-10-CM | POA: Diagnosis not present

## 2019-03-04 DIAGNOSIS — R0601 Orthopnea: Secondary | ICD-10-CM

## 2019-03-04 DIAGNOSIS — R5381 Other malaise: Secondary | ICD-10-CM | POA: Diagnosis not present

## 2019-03-04 DIAGNOSIS — Z6841 Body Mass Index (BMI) 40.0 and over, adult: Secondary | ICD-10-CM

## 2019-03-04 DIAGNOSIS — D689 Coagulation defect, unspecified: Secondary | ICD-10-CM | POA: Diagnosis not present

## 2019-03-04 DIAGNOSIS — K7460 Unspecified cirrhosis of liver: Secondary | ICD-10-CM | POA: Diagnosis present

## 2019-03-04 DIAGNOSIS — E8770 Fluid overload, unspecified: Secondary | ICD-10-CM | POA: Diagnosis present

## 2019-03-04 DIAGNOSIS — R579 Shock, unspecified: Secondary | ICD-10-CM

## 2019-03-04 DIAGNOSIS — E781 Pure hyperglyceridemia: Secondary | ICD-10-CM | POA: Diagnosis present

## 2019-03-04 DIAGNOSIS — R739 Hyperglycemia, unspecified: Secondary | ICD-10-CM | POA: Diagnosis not present

## 2019-03-04 DIAGNOSIS — J9811 Atelectasis: Secondary | ICD-10-CM | POA: Diagnosis not present

## 2019-03-04 DIAGNOSIS — Z8249 Family history of ischemic heart disease and other diseases of the circulatory system: Secondary | ICD-10-CM

## 2019-03-04 DIAGNOSIS — J9601 Acute respiratory failure with hypoxia: Secondary | ICD-10-CM | POA: Diagnosis not present

## 2019-03-04 DIAGNOSIS — J45909 Unspecified asthma, uncomplicated: Secondary | ICD-10-CM | POA: Diagnosis present

## 2019-03-04 DIAGNOSIS — I952 Hypotension due to drugs: Secondary | ICD-10-CM | POA: Diagnosis not present

## 2019-03-04 DIAGNOSIS — E871 Hypo-osmolality and hyponatremia: Secondary | ICD-10-CM | POA: Diagnosis not present

## 2019-03-04 DIAGNOSIS — K922 Gastrointestinal hemorrhage, unspecified: Secondary | ICD-10-CM | POA: Diagnosis present

## 2019-03-04 DIAGNOSIS — F1921 Other psychoactive substance dependence, in remission: Secondary | ICD-10-CM | POA: Diagnosis present

## 2019-03-04 DIAGNOSIS — D62 Acute posthemorrhagic anemia: Secondary | ICD-10-CM | POA: Diagnosis not present

## 2019-03-04 DIAGNOSIS — K921 Melena: Secondary | ICD-10-CM | POA: Diagnosis not present

## 2019-03-04 DIAGNOSIS — R601 Generalized edema: Secondary | ICD-10-CM | POA: Diagnosis not present

## 2019-03-04 DIAGNOSIS — Z781 Physical restraint status: Secondary | ICD-10-CM

## 2019-03-04 DIAGNOSIS — G4700 Insomnia, unspecified: Secondary | ICD-10-CM | POA: Diagnosis present

## 2019-03-04 DIAGNOSIS — I119 Hypertensive heart disease without heart failure: Secondary | ICD-10-CM

## 2019-03-04 DIAGNOSIS — R600 Localized edema: Secondary | ICD-10-CM | POA: Diagnosis not present

## 2019-03-04 DIAGNOSIS — K429 Umbilical hernia without obstruction or gangrene: Secondary | ICD-10-CM | POA: Diagnosis not present

## 2019-03-04 DIAGNOSIS — D696 Thrombocytopenia, unspecified: Secondary | ICD-10-CM | POA: Diagnosis not present

## 2019-03-04 DIAGNOSIS — R609 Edema, unspecified: Secondary | ICD-10-CM | POA: Diagnosis not present

## 2019-03-04 DIAGNOSIS — E46 Unspecified protein-calorie malnutrition: Secondary | ICD-10-CM | POA: Diagnosis not present

## 2019-03-04 DIAGNOSIS — G629 Polyneuropathy, unspecified: Secondary | ICD-10-CM | POA: Diagnosis present

## 2019-03-04 DIAGNOSIS — R519 Headache, unspecified: Secondary | ICD-10-CM | POA: Diagnosis not present

## 2019-03-04 DIAGNOSIS — J189 Pneumonia, unspecified organism: Secondary | ICD-10-CM | POA: Diagnosis not present

## 2019-03-04 HISTORY — DX: Gastrointestinal hemorrhage, unspecified: K92.2

## 2019-03-04 LAB — GLUCOSE, CAPILLARY: Glucose-Capillary: 175 mg/dL — ABNORMAL HIGH (ref 70–99)

## 2019-03-04 MED ORDER — PANTOPRAZOLE SODIUM 40 MG IV SOLR
40.0000 mg | Freq: Two times a day (BID) | INTRAVENOUS | Status: DC
  Administered 2019-03-05: 40 mg via INTRAVENOUS
  Filled 2019-03-04: qty 40

## 2019-03-04 MED ORDER — PANTOPRAZOLE SODIUM 40 MG IV SOLR: 40.0000 mg | Freq: Two times a day (BID) | INTRAVENOUS | Status: DC

## 2019-03-04 MED ORDER — OXYCODONE HCL 5 MG PO TABS: 5.0000 mg | ORAL_TABLET | ORAL | Status: DC | PRN

## 2019-03-04 MED ORDER — PRAVASTATIN SODIUM 10 MG PO TABS
20.0000 mg | ORAL_TABLET | Freq: Every day | ORAL | Status: DC
  Administered 2019-03-08 – 2019-03-13 (×6): 20 mg via ORAL
  Filled 2019-03-04 (×2): qty 1
  Filled 2019-03-04: qty 2
  Filled 2019-03-04: qty 1
  Filled 2019-03-04 (×2): qty 2
  Filled 2019-03-04: qty 1

## 2019-03-04 MED ORDER — NOREPINEPHRINE 4 MG/250ML-% IV SOLN
0.0000 ug/min | INTRAVENOUS | Status: DC
  Administered 2019-03-05: 40 ug/min via INTRAVENOUS
  Administered 2019-03-05: 5 ug/min via INTRAVENOUS
  Administered 2019-03-05: 5.333 ug/min via INTRAVENOUS
  Administered 2019-03-05: 33 ug/min via INTRAVENOUS
  Administered 2019-03-05: 40 ug/min via INTRAVENOUS
  Filled 2019-03-04 (×6): qty 250

## 2019-03-04 MED ORDER — ONDANSETRON HCL 4 MG/2ML IJ SOLN: 4.0000 mg | Freq: Four times a day (QID) | INTRAMUSCULAR | Status: DC | PRN

## 2019-03-04 MED ORDER — MORPHINE SULFATE (PF) 2 MG/ML IV SOLN
2.0000 mg | Freq: Once | INTRAVENOUS | Status: AC | PRN
  Administered 2019-03-05: 2 mg via INTRAVENOUS
  Filled 2019-03-04: qty 1

## 2019-03-04 MED ORDER — ALBUTEROL SULFATE (2.5 MG/3ML) 0.083% IN NEBU: 2.5000 mg | INHALATION_SOLUTION | Freq: Four times a day (QID) | RESPIRATORY_TRACT | Status: DC | PRN

## 2019-03-04 MED ORDER — HALOPERIDOL LACTATE 5 MG/ML IJ SOLN
5.0000 mg | Freq: Four times a day (QID) | INTRAMUSCULAR | Status: DC | PRN
  Administered 2019-03-04 – 2019-03-07 (×5): 5 mg via INTRAVENOUS
  Filled 2019-03-04 (×6): qty 1

## 2019-03-04 NOTE — H&P (Addendum)
NAME:  Charles Gray, MRN:  [id number], DOB:  [birth year], LOS: 0 ADMISSION DATE:  03/04/2019, CONSULTATION DATE:  03/04/19  CHIEF COMPLAINT:  GIB   Brief History   63 y.o. M with PMH of HTN, HL, Asthma and Cirrhosis, DDD who presented to [facility] on 03/02/19 for worsening dyspnea and leg edema and was treated with Lasix.  During his hospital course he had acutely dropping BP and Hgb with melanotic stool.  He also had increasing leukocytosis and lactic acidosis, but was afebrile and treated with Vanc, Aztreonam and Levaquin.   He had a CT abdomen/pelvis and abdominal CTA without obvious source of bleeding.  Hgb 14 on admission, dropped to 7.5.  He was given 3 units PRBC's and started on protonix and transfer requested to [facility].   History of present illness   Charles Gray is a 63 y.o. M with PMH of HL, HTN, asthma, cirrhosis and DDD who presented to [facility] on 03/02/19 with worsening dyspnea and LE edema.   He was seen by cardiology last month and noted worsening dyspnea, fatigue, orthopnea and PND for approx. 12 months and was started on Lasix 70m bid, pt was compliant, but symptoms worsened.   On admission, he was given Lasix 468mtid, but had a rapid response  after BP dropped to 5027-78ystolic, he was given a small fluid bolus and then transferred to the ICU and started on Levophed.  Hgb dropped substantially and he began having melanotic stools.   Prophylactic Lovenox x2 doses, no anti-platelet or anti-coagulation at home.  No NSAID use.  WBC also increased from 19k to 37k, he was afebrile, CXR clear and treated with Aztreonam, Levaquin and Vancomycin.   Work-up included CXR no edema or infiltrate,  CTA chest without PE, RUQ [place]ore[place]owing hepatic steatosis, normal ABI, CT and CTA abd/pelvis without source of GIB.   Upon arrival, pt is on Levophed 1559m awake and complaining of chronic lower back pain.   Past Medical History   has a past medical history of Fatty liver  disease, nonalcoholic, Hyperlipidemia (01/24/2019), Hypertension (01/24/2019), Insomnia (01/24/2019), and Lumbar disc herniation (01/24/2019).   Significant Hospital Events   03/02/19 [facility]/21/20 Transfer to [facility]  Consults:  Gastroenterology  Procedures:  [facility] Tests:  11/19 RUQ US>>hepatic steatosis, minimal portal vein flow, some degree of portal vein thrombosis not excluded however may be artifact 11/19 CXR>>no active cardiopulmonary disease 11/19 CTA Chest>>No PE, scattered mediastinal nodes without overt mass or adenopathy 11/21 CTA abd/pelvis>>no contrast accumulation, nodular hepatic contours 11/21 CXR>>  Micro Data:  11/19 Sars-CoV-2>>negative () 11/21>>BCx2  Antimicrobials:  Levaquin 11/21 only Aztreonam 11/21 only Vancomycin 11/21 only Rocephin 11/22- Flagyl 11/22-  Interim history/subjective:  Pt awake and alert, remains hypotensive requiring Levophed, denies CP, abdominal pain or vomiting.   Objective   Blood pressure (!) 135/97, pulse (!) 116, temperature 98 F (36.7 C), temperature source Oral, resp. rate (!) 23, height 6' 10"  (2.083 m), weight (!) 186.3 kg, SpO2 98 %.       No intake or output data in the 24 hours ending 03/04/19 2300 Filed Weights   03/04/19 2251  Weight: (!) 186.3 kg    General:  Obese, chronically ill but non-toxic appearing M groaning but not in acute distress HEENT: MM pink/moist Neuro: alert and answers questions appropriately, moving all extremities, slightly agitated CV: s1s2 tachycardic, rrr, no m/r/g PULM:  CTAB GI: soft, bs active,  no tenderness MSK: diffuse lumbar back pain Extremities: warm/dry, 2+ LE edema with chronically ruddy appearing skin Skin: no rashes or lesions   Resolved Hospital Problem list     Assessment & Plan:    Hypovolemic Shock secondary to likely upper GIB, possibly concurrent with septic shock -lactic  acid up to 6.8 at [place], no clear source of infection -Received 3 units PRBC's prior to arrival P: -continue Levophed, repeat CBC and coags, transfuse as needed -NPO, consult GI -Continue Protonix 73m bid, no hematemesis so Octreotide not indicated -Follow WBC and lactic acid, check blood cultures and cover possible infection with Rocephin/Flagyl, UA and CXR pending -Check TSH   LE edema and dyspnea, HTN, HL -Check BNP, hold diuresis in the setting of the above -Echocardiogram -Hold home Lisinopril   AKI -likely secondary to shock -Creatinine up to 1.6 from baseline <1 P: -follow BMP and avoid nephrotoxic medications  Hepatic Steatosis, possibly [name] -denies alcohol use P: -LFT's pending  DDD and chronic pain -On suboxone P: -hold suboxone in the setting of shock   Best practice:  Diet: NPO Pain/Anxiety/Delirium protocol (if indicated): prn Morphine VAP protocol (if indicated): n/a DVT prophylaxis: SCD's  GI prophylaxis: protonix Glucose control: n/a Mobility: with assist Code Status: Full Family Communication:  Disposition:  ICU  Labs   CBC: No results for input(s): WBC, NEUTROABS, HGB, HCT, MCV, PLT in the last 168 hours.  Basic Metabolic Panel: Recent Labs  Lab 02/27/19 1106  NA 146*  K 4.1  CL 102  CO2 22  GLUCOSE 99  BUN 12  CREATININE 0.84  CALCIUM 9.4  MG 2.2   GFR: Estimated Creatinine Clearance: 171.7 mL/min (by C-G formula based on SCr of 0.84 mg/dL). No results for input(s): PROCALCITON, WBC, LATICACIDVEN in the last 168 hours.  Liver Function Tests: No results for input(s): AST, ALT, ALKPHOS, BILITOT, PROT, ALBUMIN in the last 168 hours. No results for input(s): LIPASE, AMYLASE in the last 168 hours. No results for input(s): AMMONIA in the last 168 hours.  ABG No results found for: PHART, PCO2ART, PO2ART, HCO3, TCO2, ACIDBASEDEF, O2SAT   Coagulation Profile: No results for input(s): INR, PROTIME in the last 168 hours.   Cardiac Enzymes: No results for input(s): CKTOTAL, CKMB, CKMBINDEX, TROPONINI in the last 168 hours.  HbA1C: No results found for: HGBA1C  CBG: Recent Labs  Lab 03/04/19 2237  GLUCAP 175*    Review of Systems:   Negative except as noted in HPI  Past Medical History  He,  has a past medical history of Fatty liver disease, nonalcoholic, Hyperlipidemia (01/24/2019), Hypertension (01/24/2019), Insomnia (01/24/2019), and Lumbar disc herniation (01/24/2019).   Surgical History    Past Surgical History:  Procedure Laterality Date  . HEMORROIDECTOMY       Social History   reports that he has never smoked. He has never used smokeless tobacco. He reports previous drug use. Drug: Marijuana. He reports that he does not drink alcohol.   Family History   His family history includes CAD in his maternal grandfather and maternal grandmother; Hyperlipidemia in his mother; Hypertension in his mother; Stroke in his mother.   Allergies Allergies  Allergen Reactions  . Penicillins      Home Medications  Prior to Admission medications   Medication Sig Start Date End Date Taking? Authorizing Provider  buprenorphine-naloxone (SUBOXONE) 8-2 mg SUBL SL tablet Place 2 tablets under the tongue daily.     [provider]  furosemide (LASIX) 40 MG tablet Take 2  tabs (80 mg ) in the Am and 1 tab (40 Mg) in the PM 02/27/19   Tobb, Kardie, DO  lisinopril (ZESTRIL) 30 MG tablet 30 mg daily. 01/18/19   [provider]  metolazone (ZAROXOLYN) 5 MG tablet Take 1 tab daily 3 0 minutes prior to lasix 02/27/19   Tobb, Kardie, DO  potassium chloride SA (KLOR-CON) 20 MEQ tablet 20 mEq daily. 11/23/18   [provider]  pravastatin (PRAVACHOL) 20 MG tablet 20 mg daily. 11/23/18   [provider]  VENTOLIN HFA 108 (90 Base) MCG/ACT inhaler  11/23/18   [provider]  zolpidem (AMBIEN) 10 MG tablet 10 mg at bedtime as needed. 12/28/18   [provider]      Critical care time: 50 minutes    CRITICAL CARE Performed by: [name] Airyanna Dipalma   Total critical care time: 50 minutes  Critical care time was exclusive of separately billable procedures and treating other patients.  Critical care was necessary to treat or prevent imminent or life-threatening deterioration.  Critical care was time spent personally by me on the following activities: development of treatment plan with patient and/or surrogate as well as nursing, discussions with consultants, evaluation of patient's response to treatment, examination of patient, obtaining history from patient or surrogate, ordering and performing treatments and interventions, ordering and review of laboratory studies, ordering and review of radiographic studies, pulse oximetry and re-evaluation of patient's condition.   [name] Adeana Grilliot, PA-C [facility]  Pager# [phone], if no answer [phone]

## 2019-03-05 ENCOUNTER — Encounter (HOSPITAL_COMMUNITY): Payer: Self-pay | Admitting: Certified Registered"

## 2019-03-05 ENCOUNTER — Inpatient Hospital Stay (HOSPITAL_COMMUNITY): Payer: Medicare Other

## 2019-03-05 ENCOUNTER — Encounter (HOSPITAL_COMMUNITY)
Admission: AD | Disposition: A | Discharge status: Rehabilitation Facility | Payer: Self-pay | Source: Other Acute Inpatient Hospital | Attending: Internal Medicine

## 2019-03-05 ENCOUNTER — Other Ambulatory Visit (HOSPITAL_COMMUNITY): Payer: Medicare Other

## 2019-03-05 DIAGNOSIS — K921 Melena: Secondary | ICD-10-CM

## 2019-03-05 DIAGNOSIS — R0600 Dyspnea, unspecified: Secondary | ICD-10-CM

## 2019-03-05 DIAGNOSIS — K7469 Other cirrhosis of liver: Secondary | ICD-10-CM

## 2019-03-05 DIAGNOSIS — D62 Acute posthemorrhagic anemia: Secondary | ICD-10-CM

## 2019-03-05 DIAGNOSIS — R579 Shock, unspecified: Secondary | ICD-10-CM

## 2019-03-05 DIAGNOSIS — D689 Coagulation defect, unspecified: Secondary | ICD-10-CM

## 2019-03-05 DIAGNOSIS — K922 Gastrointestinal hemorrhage, unspecified: Secondary | ICD-10-CM

## 2019-03-05 DIAGNOSIS — R0602 Shortness of breath: Secondary | ICD-10-CM

## 2019-03-05 HISTORY — PX: ESOPHAGOGASTRODUODENOSCOPY (EGD) WITH PROPOFOL: SHX5813

## 2019-03-05 LAB — IRON AND TIBC
Iron: 175 ug/dL (ref 45–182)
Saturation Ratios: 50 % — ABNORMAL HIGH (ref 17.9–39.5)
TIBC: 353 ug/dL (ref 250–450)
UIBC: 178 ug/dL

## 2019-03-05 LAB — COMPREHENSIVE METABOLIC PANEL
ALT: 33 U/L (ref 0–44)
AST: 41 U/L (ref 15–41)
Albumin: 2.9 g/dL — ABNORMAL LOW (ref 3.5–5.0)
Alkaline Phosphatase: 48 U/L (ref 38–126)
Anion gap: 14 (ref 5–15)
BUN: 97 mg/dL — ABNORMAL HIGH (ref 8–23)
CO2: 24 mmol/L (ref 22–32)
Calcium: 8.4 mg/dL — ABNORMAL LOW (ref 8.9–10.3)
Chloride: 96 mmol/L — ABNORMAL LOW (ref 98–111)
Creatinine, Ser: 1.52 mg/dL — ABNORMAL HIGH (ref 0.61–1.24)
GFR calc Af Amer: 56 mL/min — ABNORMAL LOW (ref >60)
GFR calc non Af Amer: 48 mL/min — ABNORMAL LOW (ref >60)
Glucose, Bld: 178 mg/dL — ABNORMAL HIGH (ref 70–99)
Potassium: 3.7 mmol/L (ref 3.5–5.1)
Sodium: 134 mmol/L — ABNORMAL LOW (ref 135–145)
Total Bilirubin: 0.7 mg/dL (ref 0.3–1.2)
Total Protein: 5.5 g/dL — ABNORMAL LOW (ref 6.5–8.1)

## 2019-03-05 LAB — BASIC METABOLIC PANEL
Anion gap: 14 (ref 5–15)
Anion gap: 10 (ref 5–15)
BUN: 100 mg/dL — ABNORMAL HIGH (ref 8–23)
BUN: 100 mg/dL — ABNORMAL HIGH (ref 8–23)
CO2: 25 mmol/L (ref 22–32)
CO2: 24 mmol/L (ref 22–32)
Calcium: 8.2 mg/dL — ABNORMAL LOW (ref 8.9–10.3)
Calcium: 7.8 mg/dL — ABNORMAL LOW (ref 8.9–10.3)
Chloride: 96 mmol/L — ABNORMAL LOW (ref 98–111)
Chloride: 104 mmol/L (ref 98–111)
Creatinine, Ser: 1.62 mg/dL — ABNORMAL HIGH (ref 0.61–1.24)
Creatinine, Ser: 1.52 mg/dL — ABNORMAL HIGH (ref 0.61–1.24)
GFR calc Af Amer: 52 mL/min — ABNORMAL LOW (ref >60)
GFR calc Af Amer: 56 mL/min — ABNORMAL LOW (ref >60)
GFR calc non Af Amer: 45 mL/min — ABNORMAL LOW (ref >60)
GFR calc non Af Amer: 48 mL/min — ABNORMAL LOW (ref >60)
Glucose, Bld: 173 mg/dL — ABNORMAL HIGH (ref 70–99)
Glucose, Bld: 179 mg/dL — ABNORMAL HIGH (ref 70–99)
Potassium: 3.8 mmol/L (ref 3.5–5.1)
Potassium: 3.6 mmol/L (ref 3.5–5.1)
Sodium: 135 mmol/L (ref 135–145)
Sodium: 138 mmol/L (ref 135–145)

## 2019-03-05 LAB — HEMOGLOBIN AND HEMATOCRIT, BLOOD
HCT: 26.6 % — ABNORMAL LOW (ref 39.0–52.0)
Hemoglobin: 8.9 g/dL — ABNORMAL LOW (ref 13.0–17.0)

## 2019-03-05 LAB — CBC WITH DIFFERENTIAL/PLATELET
Abs Immature Granulocytes: 2.13 10*3/uL — ABNORMAL HIGH (ref 0.00–0.07)
Basophils Absolute: 0.2 10*3/uL — ABNORMAL HIGH (ref 0.0–0.1)
Basophils Relative: 1 %
Eosinophils Absolute: 0 10*3/uL (ref 0.0–0.5)
Eosinophils Relative: 0 %
HCT: 31.6 % — ABNORMAL LOW (ref 39.0–52.0)
Hemoglobin: 10.2 g/dL — ABNORMAL LOW (ref 13.0–17.0)
Immature Granulocytes: 5 %
Lymphocytes Relative: 13 %
Lymphs Abs: 5.7 10*3/uL — ABNORMAL HIGH (ref 0.7–4.0)
MCH: 29.1 pg (ref 26.0–34.0)
MCHC: 32.3 g/dL (ref 30.0–36.0)
MCV: 90.3 fL (ref 80.0–100.0)
Monocytes Absolute: 4.5 10*3/uL — ABNORMAL HIGH (ref 0.1–1.0)
Monocytes Relative: 11 %
Neutro Abs: 30 10*3/uL — ABNORMAL HIGH (ref 1.7–7.7)
Neutrophils Relative %: 70 %
Platelets: 234 10*3/uL (ref 150–400)
RBC: 3.5 MIL/uL — ABNORMAL LOW (ref 4.22–5.81)
RDW: 14.6 % (ref 11.5–15.5)
WBC: 42.4 10*3/uL — ABNORMAL HIGH (ref 4.0–10.5)
nRBC: 1.4 % — ABNORMAL HIGH (ref 0.0–0.2)

## 2019-03-05 LAB — TYPE AND SCREEN
ABO/RH(D): O POS
Antibody Screen: NEGATIVE

## 2019-03-05 LAB — TROPONIN I (HIGH SENSITIVITY)
Troponin I (High Sensitivity): 164 ng/L (ref <18)
Troponin I (High Sensitivity): 174 ng/L (ref <18)

## 2019-03-05 LAB — GLUCOSE, CAPILLARY
Glucose-Capillary: 189 mg/dL — ABNORMAL HIGH (ref 70–99)
Glucose-Capillary: 202 mg/dL — ABNORMAL HIGH (ref 70–99)
Glucose-Capillary: 193 mg/dL — ABNORMAL HIGH (ref 70–99)
Glucose-Capillary: 159 mg/dL — ABNORMAL HIGH (ref 70–99)

## 2019-03-05 LAB — CBC
HCT: 27.7 % — ABNORMAL LOW (ref 39.0–52.0)
HCT: 25.6 % — ABNORMAL LOW (ref 39.0–52.0)
Hemoglobin: 8.8 g/dL — ABNORMAL LOW (ref 13.0–17.0)
Hemoglobin: 8.6 g/dL — ABNORMAL LOW (ref 13.0–17.0)
MCH: 28.7 pg (ref 26.0–34.0)
MCH: 29.9 pg (ref 26.0–34.0)
MCHC: 31.8 g/dL (ref 30.0–36.0)
MCHC: 33.6 g/dL (ref 30.0–36.0)
MCV: 90.2 fL (ref 80.0–100.0)
MCV: 88.9 fL (ref 80.0–100.0)
Platelets: 151 10*3/uL (ref 150–400)
Platelets: 220 10*3/uL (ref 150–400)
RBC: 3.07 MIL/uL — ABNORMAL LOW (ref 4.22–5.81)
RBC: 2.88 MIL/uL — ABNORMAL LOW (ref 4.22–5.81)
RDW: 14.7 % (ref 11.5–15.5)
RDW: 15 % (ref 11.5–15.5)
WBC: 32.3 10*3/uL — ABNORMAL HIGH (ref 4.0–10.5)
WBC: 47.9 10*3/uL — ABNORMAL HIGH (ref 4.0–10.5)
nRBC: 1.1 % — ABNORMAL HIGH (ref 0.0–0.2)
nRBC: 4 % — ABNORMAL HIGH (ref 0.0–0.2)

## 2019-03-05 LAB — BRAIN NATRIURETIC PEPTIDE: B Natriuretic Peptide: 116.9 pg/mL — ABNORMAL HIGH (ref 0.0–100.0)

## 2019-03-05 LAB — MAGNESIUM: Magnesium: 1.9 mg/dL (ref 1.7–2.4)

## 2019-03-05 LAB — LIPASE, BLOOD: Lipase: 48 U/L (ref 11–51)

## 2019-03-05 LAB — FERRITIN: Ferritin: 301 ng/mL (ref 24–336)

## 2019-03-05 LAB — URINALYSIS, ROUTINE W REFLEX MICROSCOPIC
Bilirubin Urine: NEGATIVE
Glucose, UA: NEGATIVE mg/dL
Hgb urine dipstick: NEGATIVE
Ketones, ur: NEGATIVE mg/dL
Leukocytes,Ua: NEGATIVE
Nitrite: NEGATIVE
Protein, ur: NEGATIVE mg/dL
Specific Gravity, Urine: 1.021 (ref 1.005–1.030)
pH: 6 (ref 5.0–8.0)

## 2019-03-05 LAB — ABO/RH: ABO/RH(D): O POS

## 2019-03-05 LAB — LACTIC ACID, PLASMA
Lactic Acid, Venous: 5.1 mmol/L (ref 0.5–1.9)
Lactic Acid, Venous: 3.2 mmol/L (ref 0.5–1.9)

## 2019-03-05 LAB — PROTIME-INR
INR: 1.3 — ABNORMAL HIGH (ref 0.8–1.2)
INR: 1.3 — ABNORMAL HIGH (ref 0.8–1.2)
Prothrombin Time: 16 seconds — ABNORMAL HIGH (ref 11.4–15.2)
Prothrombin Time: 16.3 seconds — ABNORMAL HIGH (ref 11.4–15.2)

## 2019-03-05 LAB — PHOSPHORUS: Phosphorus: 2.1 mg/dL — ABNORMAL LOW (ref 2.5–4.6)

## 2019-03-05 LAB — HIV ANTIBODY (ROUTINE TESTING W REFLEX): HIV Screen 4th Generation wRfx: NONREACTIVE

## 2019-03-05 LAB — APTT: aPTT: 30 seconds (ref 24–36)

## 2019-03-05 LAB — TSH: TSH: 0.809 u[IU]/mL (ref 0.350–4.500)

## 2019-03-05 LAB — LACTATE DEHYDROGENASE: LDH: 220 U/L — ABNORMAL HIGH (ref 98–192)

## 2019-03-05 LAB — MRSA PCR SCREENING: MRSA by PCR: NEGATIVE

## 2019-03-05 SURGERY — ESOPHAGOGASTRODUODENOSCOPY (EGD) WITH PROPOFOL
Anesthesia: Monitor Anesthesia Care

## 2019-03-05 MED ORDER — ROCURONIUM BROMIDE 50 MG/5ML IV SOLN
50.0000 mg | Freq: Once | INTRAVENOUS | Status: AC
  Administered 2019-03-05: 50 mg via INTRAVENOUS
  Filled 2019-03-05: qty 5

## 2019-03-05 MED ORDER — FENTANYL CITRATE (PF) 100 MCG/2ML IJ SOLN
100.0000 ug | Freq: Once | INTRAMUSCULAR | Status: AC
  Administered 2019-03-05: 100 ug via INTRAVENOUS

## 2019-03-05 MED ORDER — SODIUM CHLORIDE 0.9 % IV BOLUS
500.0000 mL | Freq: Once | INTRAVENOUS | Status: AC
  Administered 2019-03-05: 500 mL via INTRAVENOUS

## 2019-03-05 MED ORDER — METOPROLOL TARTRATE 5 MG/5ML IV SOLN
INTRAVENOUS | Status: AC
  Filled 2019-03-05: qty 5

## 2019-03-05 MED ORDER — PERFLUTREN LIPID MICROSPHERE
1.0000 mL | INTRAVENOUS | Status: AC | PRN
  Administered 2019-03-05: 17:00:00 6 mL via INTRAVENOUS
  Filled 2019-03-05: qty 10

## 2019-03-05 MED ORDER — CHLORHEXIDINE GLUCONATE 0.12% ORAL RINSE (MEDLINE KIT)
15.0000 mL | Freq: Two times a day (BID) | OROMUCOSAL | Status: DC
  Administered 2019-03-05 – 2019-03-06 (×2): 15 mL via OROMUCOSAL

## 2019-03-05 MED ORDER — FENTANYL CITRATE (PF) 100 MCG/2ML IJ SOLN: 50.0000 ug | Freq: Once | INTRAMUSCULAR | Status: DC

## 2019-03-05 MED ORDER — PROPOFOL 1000 MG/100ML IV EMUL
0.0000 ug/kg/min | INTRAVENOUS | Status: DC
  Administered 2019-03-05: 50 ug/kg/min via INTRAVENOUS
  Administered 2019-03-05: 10 ug/kg/min via INTRAVENOUS
  Administered 2019-03-05: 35 ug/kg/min via INTRAVENOUS
  Administered 2019-03-05: 30 ug/kg/min via INTRAVENOUS
  Administered 2019-03-06: 35 ug/kg/min via INTRAVENOUS
  Administered 2019-03-06: 28 ug/kg/min via INTRAVENOUS
  Administered 2019-03-06: 30 ug/kg/min via INTRAVENOUS
  Filled 2019-03-05 (×3): qty 100
  Filled 2019-03-05: qty 200
  Filled 2019-03-05: qty 100
  Filled 2019-03-05: qty 200
  Filled 2019-03-05: qty 100

## 2019-03-05 MED ORDER — ORAL CARE MOUTH RINSE
15.0000 mL | OROMUCOSAL | Status: DC
  Administered 2019-03-05 – 2019-03-07 (×18): 15 mL via OROMUCOSAL

## 2019-03-05 MED ORDER — METRONIDAZOLE IN NACL 5-0.79 MG/ML-% IV SOLN
500.0000 mg | Freq: Three times a day (TID) | INTRAVENOUS | Status: DC
  Administered 2019-03-05 – 2019-03-07 (×8): 500 mg via INTRAVENOUS
  Filled 2019-03-05 (×8): qty 100

## 2019-03-05 MED ORDER — MIDAZOLAM HCL 2 MG/2ML IJ SOLN
2.0000 mg | Freq: Once | INTRAMUSCULAR | Status: AC
  Administered 2019-03-05: 2 mg via INTRAVENOUS

## 2019-03-05 MED ORDER — FENTANYL BOLUS VIA INFUSION
50.0000 ug | INTRAVENOUS | Status: DC | PRN
  Administered 2019-03-05 – 2019-03-06 (×8): 50 ug via INTRAVENOUS
  Filled 2019-03-05: qty 50

## 2019-03-05 MED ORDER — METOPROLOL TARTRATE 5 MG/5ML IV SOLN
2.5000 mg | Freq: Once | INTRAVENOUS | Status: AC
  Administered 2019-03-05: 2.5 mg via INTRAVENOUS

## 2019-03-05 MED ORDER — MIDAZOLAM HCL 2 MG/2ML IJ SOLN
4.0000 mg | Freq: Once | INTRAMUSCULAR | Status: AC
  Administered 2019-03-05: 4 mg via INTRAVENOUS
  Filled 2019-03-05: qty 4

## 2019-03-05 MED ORDER — SODIUM CHLORIDE 0.9 % IV SOLN
8.0000 mg/h | INTRAVENOUS | Status: DC
  Administered 2019-03-05 – 2019-03-06 (×3): 8 mg/h via INTRAVENOUS
  Filled 2019-03-05 (×4): qty 80

## 2019-03-05 MED ORDER — FENTANYL CITRATE (PF) 100 MCG/2ML IJ SOLN
INTRAMUSCULAR | Status: AC
  Filled 2019-03-05: qty 2

## 2019-03-05 MED ORDER — FENTANYL 2500MCG IN NS 250ML (10MCG/ML) PREMIX INFUSION
0.0000 ug/h | INTRAVENOUS | Status: DC
  Administered 2019-03-05 – 2019-03-06 (×2): 150 ug/h via INTRAVENOUS
  Administered 2019-03-06: 300 ug/h via INTRAVENOUS
  Filled 2019-03-05 (×3): qty 250

## 2019-03-05 MED ORDER — NOREPINEPHRINE 4 MG/250ML-% IV SOLN: 0.0000 ug/min | INTRAVENOUS | Status: DC

## 2019-03-05 MED ORDER — SODIUM CHLORIDE 0.9 % IV SOLN
2.0000 g | Freq: Every day | INTRAVENOUS | Status: AC
  Administered 2019-03-05 – 2019-03-09 (×6): 2 g via INTRAVENOUS
  Filled 2019-03-05 (×6): qty 20

## 2019-03-05 MED ORDER — ETOMIDATE 2 MG/ML IV SOLN
20.0000 mg | Freq: Once | INTRAVENOUS | Status: AC
  Administered 2019-03-05: 20 mg via INTRAVENOUS

## 2019-03-05 MED ORDER — SODIUM CHLORIDE 0.9 % IV SOLN
50.0000 ug/h | INTRAVENOUS | Status: DC
  Administered 2019-03-05 – 2019-03-06 (×3): 50 ug/h via INTRAVENOUS
  Filled 2019-03-05 (×4): qty 1

## 2019-03-05 MED ORDER — MIDAZOLAM HCL 2 MG/2ML IJ SOLN
INTRAMUSCULAR | Status: AC
  Filled 2019-03-05: qty 4

## 2019-03-05 MED ORDER — SODIUM BICARBONATE 8.4 % IV SOLN: 100.0000 meq | Freq: Once | INTRAVENOUS | Status: DC

## 2019-03-05 MED ORDER — CHLORHEXIDINE GLUCONATE CLOTH 2 % EX PADS
6.0000 | MEDICATED_PAD | Freq: Every day | CUTANEOUS | Status: DC
  Administered 2019-03-05: 6 via TOPICAL

## 2019-03-05 MED ORDER — NOREPINEPHRINE 16 MG/250ML-% IV SOLN
0.0000 ug/min | INTRAVENOUS | Status: DC
  Administered 2019-03-05: 40 ug/min via INTRAVENOUS
  Administered 2019-03-06: 30 ug/min via INTRAVENOUS
  Filled 2019-03-05 (×2): qty 250

## 2019-03-05 SURGICAL SUPPLY — 15 items

## 2019-03-05 NOTE — H&P (View-Only) (Signed)
Consultation  Referring Provider: CCM/ Dr Karsten Fells  Primary Care Physician:  Enid Skeens., MD Primary Gastroenterologist:  none  Reason for Consultation:  GI bleed  HPI: Charles Gray is a 63 y.o. male, transferred from [facility] last evening with acute GI bleed.  No GI coverage available at [facility]. Patient was initially admitted at [facility] on 03/02/2019 with complaints of shortness of breath and lower extremity edema.  He had apparently been seen by cardiology within the month previous for dyspnea and fatigue over several months and was started on Lasix 40 mg twice daily but symptoms worsened.  Initially after admission to [facility] he became acutely hypotensive, was started on pressors, rapid response, then it was noted that his hemoglobin had dropped significantly and he began having black stools.  He had received 2 doses of Lovenox, but was not on any aspirin anticoagulation or antiplatelet therapy at home. Also noted to have rising WBC, today up to 42.4.  Thus far infectious work-up has been unrevealing with negative chest x-ray.  He had CT a of the chest at [facility] with no evidence for PE.  Upper abdominal ultrasound showed hepatic steatosis and apparently CT of the abdomen and pelvis at [facility] showed no source for infection or GI bleed-did confirm nodular liver consistent with cirrhosis. Patient does have history of fatty liver disease with cirrhosis, hyperlipidemia, hypertension and lumbar disc disease.  He received a total of 3 units of packed RBCs at [facility] for hemoglobin in the 7 range.  Last night on arrival here hemoglobin was 10.2.  This morning hemoglobin 8.8. Nursing reports he has not had any melena since arrival here.  Levophed turned off this morning and thus far blood pressure remaining stable in the 811 range systolic, remains tachycardic.  Patient is a poor historian but says has not had any history of GI problems, was not aware of having  cirrhosis but did know he had fatty liver.  He has been having some upper abdominal discomfort recently.  Again denies any aspirin or NSAID use.  Labs-INR 1.3, platelets 234 BUN 97/creatinine 1.52 Lactate 5.1 Blood cultures pending  Is on broad-spectrum antibiotic coverage, IV PPI twice daily .    Past Medical History:  Diagnosis Date  . Fatty liver disease, nonalcoholic   . Hyperlipidemia 01/24/2019  . Hypertension 01/24/2019  . Insomnia 01/24/2019  . Lumbar disc herniation 01/24/2019    Past Surgical History:  Procedure Laterality Date  . HEMORROIDECTOMY      Prior to Admission medications   Medication Sig Start Date End Date Taking? Authorizing Provider  buprenorphine-naloxone (SUBOXONE) 8-2 mg SUBL SL tablet Place 2 tablets under the tongue daily.     [provider]  furosemide (LASIX) 40 MG tablet Take 2 tabs (80 mg ) in the Am and 1 tab (40 Mg) in the PM 02/27/19   Tobb, Kardie, DO  lisinopril (ZESTRIL) 30 MG tablet 30 mg daily. 01/18/19   [provider]  metolazone (ZAROXOLYN) 5 MG tablet Take 1 tab daily 3 0 minutes prior to lasix 02/27/19   Tobb, Kardie, DO  potassium chloride SA (KLOR-CON) 20 MEQ tablet 20 mEq daily. 11/23/18   [provider]  pravastatin (PRAVACHOL) 20 MG tablet 20 mg daily. 11/23/18   [provider]  VENTOLIN HFA 108 (90 Base) MCG/ACT inhaler  11/23/18   [provider]  zolpidem (AMBIEN) 10 MG tablet 10 mg at bedtime as needed. 12/28/18   [provider]  Current Facility-Administered Medications  Medication Dose Route Frequency Provider Last Rate Last Dose  . albuterol (PROVENTIL) (2.5 MG/3ML) 0.083% nebulizer solution 2.5 mg  2.5 mg Inhalation Q6H PRN [name], [name], PA-C      . cefTRIAXone (ROCEPHIN) 2 g in sodium chloride 0.9 % 100 mL IVPB  2 g Intravenous QHS Bennie Pierini, MD   Stopped at 03/05/19 0133  . Chlorhexidine Gluconate Cloth 2 % PADS 6 each  6 each Topical Q0600  Olalere, Adewale A, MD      . haloperidol lactate (HALDOL) injection 5 mg  5 mg Intravenous Q6H PRN [name], [name], PA-C   5 mg at 03/04/19 2315  . metroNIDAZOLE (FLAGYL) IVPB 500 mg  500 mg Intravenous Q8H Bennie Pierini, MD 100 mL/hr at 03/05/19 0807 500 mg at 03/05/19 0807  . norepinephrine (LEVOPHED) 65m in 251mpremix infusion  0-40 mcg/min Intravenous Titrated [name], LaOtilio CarpenPA-C 18.75 mL/hr at 03/05/19 0803 5 mcg/min at 03/05/19 0803  . ondansetron (ZOFRAN) injection 4 mg  4 mg Intravenous Q6H PRN [name], LaOtilio CarpenPA-C      . pantoprazole (PROTONIX) injection 40 mg  40 mg Intravenous Q12H ScBennie PieriniMD   40 mg at 03/05/19 0802  . pravastatin (PRAVACHOL) tablet 20 mg  20 mg Oral Daily [name], LaOtilio CarpenPA-C        Allergies as of 03/04/2019 - Review Complete 02/27/2019  Allergen Reaction Noted  . Penicillins  01/24/2019    Family History  Problem Relation Age of Onset  . Hyperlipidemia Mother   . Hypertension Mother   . Stroke Mother   . CAD Maternal Grandmother   . CAD Maternal Grandfather     Social History   Socioeconomic History  . Marital status: Unknown    Spouse name: Not on file  . Number of children: Not on file  . Years of education: Not on file  . Highest education level: Not on file  Occupational History  . Not on file  Social Needs  . Financial resource strain: Not on file  . Food insecurity    Worry: Not on file    Inability: Not on file  . Transportation needs    Medical: Not on file    Non-medical: Not on file  Tobacco Use  . Smoking status: Never Smoker  . Smokeless tobacco: Never Used  Substance and Sexual Activity  . Alcohol use: Never    Frequency: Never  . Drug use: Not Currently    Types: Marijuana    Comment: Smoked for 30 years  . Sexual activity: Not on file  Lifestyle  . Physical activity    Days per week: Not on file    Minutes per session: Not on file  . Stress: Not on file  Relationships  . Social  coHerbalistn phone: Not on file    Gets together: Not on file    Attends religious service: Not on file    Active member of club or organization: Not on file    Attends meetings of clubs or organizations: Not on file    Relationship status: Not on file  . Intimate partner violence    Fear of current or ex partner: Not on file    Emotionally abused: Not on file    Physically abused: Not on file    Forced sexual activity: Not on file  Other Topics Concern  . Not on file  Social History Narrative  .  Not on file    Review of Systems: Gen: Denies any fever, chills, sweats, anorexia, fatigue, weakness, malaise, weight loss, and sleep disorder CV: Denies chest pain, angina, palpitations, syncope, orthopnea, PND, peripheral edema, and claudication. Resp: Denies dyspnea at rest, dyspnea with exercise, cough, sputum, wheezing, coughing up blood, and pleurisy. GI: Denies vomiting blood, jaundice, and fecal incontinence.   Denies dysphagia or odynophagia. GU : Denies urinary burning, blood in urine, urinary frequency, urinary hesitancy, nocturnal urination, and urinary incontinence. MS: Denies joint pain, limitation of movement, and swelling, stiffness, low back pain, extremity pain. Denies muscle weakness, cramps, atrophy.  Derm: Denies rash, itching, dry skin, hives, moles, warts, or unhealing ulcers.  Psych: Denies depression, anxiety, memory loss, suicidal ideation, hallucinations, paranoia, and confusion. Heme: Denies bruising, bleeding, and enlarged lymph nodes. Neuro:  Denies any headaches, dizziness, paresthesias. Endo:  Denies any problems with DM, thyroid, adrenal function.  Physical Exam: Vital signs in last 24 hours: Temp:  [98 F (36.7 C)-98.9 F (37.2 C)] 98 F (36.7 C) (11/22 0800) Pulse Rate:  [99-128] 121 (11/22 0800) Resp:  [11-28] 15 (11/22 0800) BP: (95-149)/(41-99) 149/73 (11/22 0800) SpO2:  [90 %-100 %] 98 % (11/22 0800) Weight:  [186.3 kg-188.8 kg] 188.8  kg (11/22 0500)   General:   Alert,  Well-developed, well-nourished, obese white male pleasant and cooperative in NAD.  Moaning for water Head:  Normocephalic and atraumatic. Eyes:  Sclera clear, no icterus.   Conjunctiva pink. Ears:  Normal auditory acuity. Nose:  No deformity, discharge,  or lesions. Mouth:  No deformity or lesions.   Neck:  Supple; no masses or thyromegaly. Lungs:  Clear throughout to auscultation.  Decreased breath sounds bases heart:Tachy  Regular rate and rhythm; no murmurs, clicks, rubs,  or gallops. Abdomen: Soft, morbidly obese, bowel sounds present, no focal tenderness, no palpable mass or hepatosplenomegaly.   Rectal:  Deferred  Msk:  Symmetrical without gross deformities. . Pulses:  Normal pulses noted. Extremities: 1+ edema to the knee bilateral Neurologic:  Alert and  oriented x4;  grossly normal neurologically. Skin:  Intact without significant lesions or rashes.. Psych:  Alert and cooperative. Normal mood and affect.  Intake/Output from previous day: 11/21 0701 - 11/22 0700 In: 208.2 [I.V.:8.2; IV Piggyback:200] Out: 800 [Urine:800] Intake/Output this shift: Total I/O In: 19.7 [I.V.:19.7] Out: -   Lab Results: Recent Labs    03/04/19 2322 03/05/19 0600  WBC 42.4* 32.3*  HGB 10.2* 8.8*  HCT 31.6* 27.7*  PLT 234 151   BMET Recent Labs    03/04/19 2322 03/05/19 0052  NA 134* 135  K 3.7 3.8  CL 96* 96*  CO2 24 25  GLUCOSE 178* 173*  BUN 97* 100*  CREATININE 1.52* 1.62*  CALCIUM 8.4* 8.2*   LFT Recent Labs    03/04/19 2322  PROT 5.5*  ALBUMIN 2.9*  AST 41  ALT 33  ALKPHOS 48  BILITOT 0.7   PT/INR Recent Labs    03/04/19 2322  LABPROT 16.0*  INR 1.3*   Hepatitis Panel No results for input(s): HEPBSAG, HCVAB, HEPAIGM, HEPBIGM in the last 72 hours.    IMPRESSION:  #73 63 year old white male with acute upper GI bleed placated by hypovolemic shock . Etiology not clear Patient does have cirrhosis by CT.  Rule out peptic  ulcer disease, rule out variceal bleed, rule out portal gastropathy. Patient is currently hemodynamically stable currently.  However he did develop acute hypotension yesterday and was requiring pressors until this morning   #  2-anemia secondary to acute blood loss  #3 Marked leukocytosis and elevated lactate-possible con current sepsis Rule out pneumonia #4 acute kidney injury #5 new diagnosis of cirrhosis-rule out [place].  No history of EtOH use however does have prior history of drug use and is on Suboxone. We will out hepatitis C/hepatitis B  PLAN: N.p.o. Change Protonix to Protonix infusion Start octreotide infusion Serial hemoglobins and transfuse to keep hemoglobin 7 Patient will need upper endoscopy today.  Patient may need to be intubated prophylactically prior to procedure-we will discuss with CCM  Check hepatitis serologies Need to confirm patient was Covid tested at [facility]  03/05/2019, 8:58 AM  I have reviewed the entire case in detail with the above APP and discussed the plan in detail.  Therefore, I agree with the diagnoses recorded above. In addition,  I have personally interviewed and examined the patient and have discussed the case with the patient's ICU nurse and physician.  My additional thoughts are as follows:  Very complex patient with underlying heart disease, recent increase in diuresis, complex hospital stay in [place] with hypotension and then reportedly overt GI bleeding with melena and significant drop in hemoglobin requiring transfusion.  Critical care also feels there is probably a septic component with marked leukocytosis, though the acuity of that is unknown at present.  Peripheral smear findings suggest atypical lymphocytes. At any rate, it is felt that the GI bleeding is the major contributing factor to his persistent hypotension.  He was briefly off pressors this morning, but during my evaluation his blood pressure dropped to 75  systolic with persistent tachycardia at 110-1 15, and Levophed was resumed. His CT scan at [facility] reportedly showed cirrhosis, which was a previously unknown diagnosis as far as we know. So has a history of substance abuse and is on Suboxone, is morbidly obese with a body habitus that would confer a difficult to control airway for endoscopic procedures.  Platelets are dropping, perhaps at least in part from volume resuscitation but also consumption from GI bleeding.  He was mildly coagulopathic, possibly sepsis, underlying cirrhosis, consumption.  Although he reportedly has not had melena since arrival late last night, I am concerned he still has ongoing GI bleeding with his persistent hypotension.  I cannot get any helpful history or review of systems out of this patient as he only continues to ask for water.  I feel he should have upper endoscopy today to rule out an upper GI source of blood loss, especially esophageal or gastric varices.  Given his medical complexity and all the issues noted above, he cannot be safely and adequately sedated with sedation for a bedside procedure.  He is also too unstable to leave the unit and come to the endoscopy department.  Therefore, I have asked Dr. Ander Slade of critical care to electively intubate this patient for the procedure.  He was agreeable, and we have also started Protonix and octreotide drips.  The procedure will be done later this morning after all of these preparations and interventions have been accomplished.  The procedure was described in detail along with risks and benefits in the presence of the patient's critical care nurse  The benefits and risks of the planned procedure were described in detail with the patient or (when appropriate) their health care proxy.  Risks were outlined as including, but not limited to, bleeding, infection, perforation, adverse medication reaction leading to cardiac or pulmonary decompensation, pancreatitis  (if ERCP).  The limitation  of incomplete mucosal visualization was also discussed.  No guarantees or warranties were given. Patient at increased risk for cardiopulmonary complications of procedure due to medical comorbidities.  He remains critically ill.  Patient was agreeable, further plans will follow based on those findings.  Nelida Meuse III [phone]

## 2019-03-05 NOTE — Progress Notes (Signed)
NAME:  Charles Gray, MRN:  [id number], DOB:  Jun 12, 1955, LOS: 1 ADMISSION DATE:  03/04/2019, CONSULTATION DATE:  03/05/19  CHIEF COMPLAINT:  GIB   Brief History   62 y.o. M with PMH of HTN, HL, Asthma and Cirrhosis, DDD who presented to [facility] on 03/02/19 for worsening dyspnea and leg edema and was treated with Lasix.  During his hospital course he had acutely dropping BP and Hgb with melanotic stool.  He also had increasing leukocytosis and lactic acidosis, but was afebrile and treated with Vanc, Aztreonam and Levaquin.   He had a CT abdomen/pelvis and abdominal CTA without obvious source of bleeding.  Hgb 14 on admission, dropped to 7.5.  He was given 3 units PRBC's and started on protonix and transfer requested to [facility].   History of present illness   Charles Gray is a 63 y.o. M with PMH of HL, HTN, asthma, cirrhosis and DDD who presented to [facility] on 03/02/19 with worsening dyspnea and LE edema.   He was seen by cardiology last month and noted worsening dyspnea, fatigue, orthopnea and PND for approx. 12 months and was started on Lasix 37m bid, pt was compliant, but symptoms worsened.   On admission, he was given Lasix 413mtid, but had a rapid response  after BP dropped to 5040-98ystolic, he was given a small fluid bolus and then transferred to the ICU and started on Levophed.  Hgb dropped substantially and he began having melanotic stools.   Prophylactic Lovenox x2 doses, no anti-platelet or anti-coagulation at home.  No NSAID use.  WBC also increased from 19k to 37k, he was afebrile, CXR clear and treated with Aztreonam, Levaquin and Vancomycin.   Work-up included CXR no edema or infiltrate,  CTA chest without PE, RUQ USKoreahowing hepatic steatosis, normal ABI, CT and CTA abd/pelvis without source of GIB.   Upon arrival, pt is on Levophed 1544m awake and complaining of chronic lower back pain.   Past Medical History   has a past medical history of Fatty liver  disease, nonalcoholic, Hyperlipidemia (01/24/2019), Hypertension (01/24/2019), Insomnia (01/24/2019), and Lumbar disc herniation (01/24/2019).   Significant Hospital Events   03/02/19 [facility]  Consults:  Gastroenterology  Procedures:  Central Line placed at [facility] Diagnostic Tests:  11/19 RUQ US>>hepatic steatosis, minimal portal vein flow, some degree of portal vein thrombosis not excluded however may be artifact 11/19 CXR>>no active cardiopulmonary disease 11/19 CTA Chest>>No PE, scattered mediastinal nodes without overt mass or adenopathy 11/21 CTA abd/pelvis>>no contrast accumulation, nodular hepatic contours 11/21 CXR>>  Micro Data:  11/19 Sars-CoV-2>>negative ([place]) 11/21>>BCx2  Antimicrobials:  Levaquin 11/21 only Aztreonam 11/21 only Vancomycin 11/21 only Rocephin 11/22- Flagyl 11/22-  Interim history/subjective:  Patient is awake and interactive Wants to eat Still on pressors  Objective   Blood pressure (!) 149/73, pulse (!) 121, temperature 98 F (36.7 C), temperature source Axillary, resp. rate 15, height 6' 10"  (2.083 m), weight (!) 188.8 kg, SpO2 98 %. CVP:  [8 mmHg] 8 mmHg      Intake/Output Summary (Last 24 hours) at 03/05/2019 [id number] data filed at 03/05/2019 [id number] per 24 hour  Intake 227.9 ml  Output 800 ml  Net -572.1 ml   Filed Weights   03/04/19 2251 03/05/19 0500  Weight: (!) 186.3 kg (!) 188.8 kg    General: Obese, chronically ill-appearing HEENT: Pupils reacting, moist oral mucosa Neuro: Appropriate, following all commands, moving all  extremities CV: S1-S2 appreciated PULM: Clear breath sounds GI: Soft, bowel sounds appreciated MSK: Back pain Extremities: lower extremity edema  skin: Warm/dry   Resolved Hospital Problem list     Assessment & Plan:   Hypovolemic shock secondary to likely upper GI bleed Concurrent sepsis -Elevated lactate -Post 3  units packed red blood cells  On pressors, will wean as tolerated -N.p.o. -GI consult -Trend lactate -Follow cultures -Continue current antibiotics, will de-escalate as able  Probable pneumonia Chest x-ray with bibasal atelectasis -Continue Rocephin -Follow radiological changes intermittently  History of hypertension/hyperlipidemia -Follow echocardiogram -Hold lisinopril at present  Acute kidney injury -Likely related to hypotension/shock -Trend electrolytes -Avoid nephrotoxic's  Hepatic steatosis Likely related to [name] -No history of alcohol use -Trend LFTs  Chronic pain -On Suboxone -Suboxone on hold in the context of shock   We will keep n.p.o. Continue to reach out to GI Continue PPI Wean pressors   Best practice:  Diet: N.p.o. Pain/Anxiety/Delirium protocol (if indicated): As needed morphine VAP protocol (if indicated): n/a DVT prophylaxis: SCD's  GI prophylaxis: Tonics Glucose control: n/a Mobility: with assist Code Status: Full Family Communication: Will update Disposition:  ICU  Labs   CBC: Recent Labs  Lab 03/04/19 2322 03/05/19 0600  WBC 42.4* 32.3*  NEUTROABS 30.0*  --   HGB 10.2* 8.8*  HCT 31.6* 27.7*  MCV 90.3 90.2  PLT 234 595    Basic Metabolic Panel: Recent Labs  Lab 02/27/19 1106 03/04/19 2322  NA 146* 134*  K 4.1 3.7  CL 102 96*  CO2 22 24  GLUCOSE 99 178*  BUN 12 97*  CREATININE 0.84 1.52*  CALCIUM 9.4 8.4*  MG 2.2 1.9  PHOS  --  2.1*   GFR: Estimated Creatinine Clearance: 95.6 mL/min (A) (by C-G formula based on SCr of 1.52 mg/dL (H)). Recent Labs  Lab 03/04/19 2322 03/04/19 2324 03/05/19 0600  WBC 42.4*  --  32.3*  LATICACIDVEN  --  5.1*  --     Liver Function Tests: Recent Labs  Lab 03/04/19 2322  AST 41  ALT 33  ALKPHOS 48  BILITOT 0.7  PROT 5.5*  ALBUMIN 2.9*   Recent Labs  Lab 03/04/19 2329  LIPASE 48   No results for input(s): AMMONIA in the last 168 hours.  ABG No results found  for: PHART, PCO2ART, PO2ART, HCO3, TCO2, ACIDBASEDEF, O2SAT   Coagulation Profile: Recent Labs  Lab 03/04/19 2322  INR 1.3*    Cardiac Enzymes: No results for input(s): CKTOTAL, CKMB, CKMBINDEX, TROPONINI in the last 168 hours.  HbA1C: No results found for: HGBA1C  CBG: Recent Labs  Lab 03/04/19 2237 03/05/19 0804  GLUCAP 175* 189*    Review of Systems:   Negative except as noted in HPI  Past Medical History  He,  has a past medical history of Fatty liver disease, nonalcoholic, Hyperlipidemia (01/24/2019), Hypertension (01/24/2019), Insomnia (01/24/2019), and Lumbar disc herniation (01/24/2019).   Surgical History    Past Surgical History:  Procedure Laterality Date  . HEMORROIDECTOMY       Social History   reports that he has never smoked. He has never used smokeless tobacco. He reports previous drug use. Drug: Marijuana. He reports that he does not drink alcohol.   Family History   His family history includes CAD in his maternal grandfather and maternal grandmother; Hyperlipidemia in his mother; Hypertension in his mother; Stroke in his mother.   Allergies Allergies  Allergen Reactions  . Penicillins  Home Medications  Prior to Admission medications   Medication Sig Start Date End Date Taking? Authorizing Provider  buprenorphine-naloxone (SUBOXONE) 8-2 mg SUBL SL tablet Place 2 tablets under the tongue daily.     [provider]  furosemide (LASIX) 40 MG tablet Take 2 tabs (80 mg ) in the Am and 1 tab (40 Mg) in the PM 02/27/19   Tobb, Kardie, DO  lisinopril (ZESTRIL) 30 MG tablet 30 mg daily. 01/18/19   [provider]  metolazone (ZAROXOLYN) 5 MG tablet Take 1 tab daily 3 0 minutes prior to lasix 02/27/19   Tobb, Kardie, DO  potassium chloride SA (KLOR-CON) 20 MEQ tablet 20 mEq daily. 11/23/18   [provider]  pravastatin (PRAVACHOL) 20 MG tablet 20 mg daily. 11/23/18   [provider]  VENTOLIN HFA 108 (90 Base)  MCG/ACT inhaler  11/23/18   [provider]  zolpidem (AMBIEN) 10 MG tablet 10 mg at bedtime as needed. 12/28/18   [provider]     The patient is critically ill with multiple organ systems failure and requires high complexity decision making for assessment and support, frequent evaluation and titration of therapies, application of advanced monitoring technologies and extensive interpretation of multiple databases. Critical Care Time devoted to patient care services described in this note independent of APP/resident time (if applicable)  is 32 minutes.   Sherrilyn Rist MD [place] [facility] Personal pager: [phone] If unanswered, please page CCM On-call: [phone]

## 2019-03-05 NOTE — Progress Notes (Signed)
CXr shows tube in good position

## 2019-03-05 NOTE — Consult Note (Addendum)
Consultation  Referring Provider: CCM/ Dr Karsten Fells  Primary Care Physician:  Enid Skeens., MD Primary Gastroenterologist:  none  Reason for Consultation:  GI bleed  HPI: Charles Gray is a 63 y.o. male, transferred from [facility] last evening with acute GI bleed.  No GI coverage available at [facility]. Patient was initially admitted at [facility] on 03/02/2019 with complaints of shortness of breath and lower extremity edema.  He had apparently been seen by cardiology within the month previous for dyspnea and fatigue over several months and was started on Lasix 40 mg twice daily but symptoms worsened.  Initially after admission to [facility] he became acutely hypotensive, was started on pressors, rapid response, then it was noted that his hemoglobin had dropped significantly and he began having black stools.  He had received 2 doses of Lovenox, but was not on any aspirin anticoagulation or antiplatelet therapy at home. Also noted to have rising WBC, today up to 42.4.  Thus far infectious work-up has been unrevealing with negative chest x-ray.  He had CT a of the chest at [facility] with no evidence for PE.  Upper abdominal ultrasound showed hepatic steatosis and apparently CT of the abdomen and pelvis at [facility] showed no source for infection or GI bleed-did confirm nodular liver consistent with cirrhosis. Patient does have history of fatty liver disease with cirrhosis, hyperlipidemia, hypertension and lumbar disc disease.  He received a total of 3 units of packed RBCs at [facility] for hemoglobin in the 7 range.  Last night on arrival here hemoglobin was 10.2.  This morning hemoglobin 8.8. Nursing reports he has not had any melena since arrival here.  Levophed turned off this morning and thus far blood pressure remaining stable in the 169 range systolic, remains tachycardic.  Patient is a poor historian but says has not had any history of GI problems, was not aware of having  cirrhosis but did know he had fatty liver.  He has been having some upper abdominal discomfort recently.  Again denies any aspirin or NSAID use.  Labs-INR 1.3, platelets 234 BUN 97/creatinine 1.52 Lactate 5.1 Blood cultures pending  Is on broad-spectrum antibiotic coverage, IV PPI twice daily .    Past Medical History:  Diagnosis Date  . Fatty liver disease, nonalcoholic   . Hyperlipidemia 01/24/2019  . Hypertension 01/24/2019  . Insomnia 01/24/2019  . Lumbar disc herniation 01/24/2019    Past Surgical History:  Procedure Laterality Date  . HEMORROIDECTOMY      Prior to Admission medications   Medication Sig Start Date End Date Taking? Authorizing Provider  buprenorphine-naloxone (SUBOXONE) 8-2 mg SUBL SL tablet Place 2 tablets under the tongue daily.     [provider]  furosemide (LASIX) 40 MG tablet Take 2 tabs (80 mg ) in the Am and 1 tab (40 Mg) in the PM 02/27/19   Tobb, Kardie, DO  lisinopril (ZESTRIL) 30 MG tablet 30 mg daily. 01/18/19   [provider]  metolazone (ZAROXOLYN) 5 MG tablet Take 1 tab daily 3 0 minutes prior to lasix 02/27/19   Tobb, Kardie, DO  potassium chloride SA (KLOR-CON) 20 MEQ tablet 20 mEq daily. 11/23/18   [provider]  pravastatin (PRAVACHOL) 20 MG tablet 20 mg daily. 11/23/18   [provider]  VENTOLIN HFA 108 (90 Base) MCG/ACT inhaler  11/23/18   [provider]  zolpidem (AMBIEN) 10 MG tablet 10 mg at bedtime as needed. 12/28/18   [provider]  Current Facility-Administered Medications  Medication Dose Route Frequency Provider Last Rate Last Dose  . albuterol (PROVENTIL) (2.5 MG/3ML) 0.083% nebulizer solution 2.5 mg  2.5 mg Inhalation Q6H PRN [name], [name], PA-C      . cefTRIAXone (ROCEPHIN) 2 g in sodium chloride 0.9 % 100 mL IVPB  2 g Intravenous QHS Bennie Pierini, MD   Stopped at 03/05/19 0133  . Chlorhexidine Gluconate Cloth 2 % PADS 6 each  6 each Topical Q0600  Olalere, Adewale A, MD      . haloperidol lactate (HALDOL) injection 5 mg  5 mg Intravenous Q6H PRN [name], [name], PA-C   5 mg at 03/04/19 2315  . metroNIDAZOLE (FLAGYL) IVPB 500 mg  500 mg Intravenous Q8H Bennie Pierini, MD 100 mL/hr at 03/05/19 0807 500 mg at 03/05/19 0807  . norepinephrine (LEVOPHED) 20m in 2573mpremix infusion  0-40 mcg/min Intravenous Titrated [name], LaOtilio CarpenPA-C 18.75 mL/hr at 03/05/19 0803 5 mcg/min at 03/05/19 0803  . ondansetron (ZOFRAN) injection 4 mg  4 mg Intravenous Q6H PRN [name], LaOtilio CarpenPA-C      . pantoprazole (PROTONIX) injection 40 mg  40 mg Intravenous Q12H ScBennie PieriniMD   40 mg at 03/05/19 0802  . pravastatin (PRAVACHOL) tablet 20 mg  20 mg Oral Daily [name], LaOtilio CarpenPA-C        Allergies as of 03/04/2019 - Review Complete 02/27/2019  Allergen Reaction Noted  . Penicillins  01/24/2019    Family History  Problem Relation Age of Onset  . Hyperlipidemia Mother   . Hypertension Mother   . Stroke Mother   . CAD Maternal Grandmother   . CAD Maternal Grandfather     Social History   Socioeconomic History  . Marital status: Unknown    Spouse name: Not on file  . Number of children: Not on file  . Years of education: Not on file  . Highest education level: Not on file  Occupational History  . Not on file  Social Needs  . Financial resource strain: Not on file  . Food insecurity    Worry: Not on file    Inability: Not on file  . Transportation needs    Medical: Not on file    Non-medical: Not on file  Tobacco Use  . Smoking status: Never Smoker  . Smokeless tobacco: Never Used  Substance and Sexual Activity  . Alcohol use: Never    Frequency: Never  . Drug use: Not Currently    Types: Marijuana    Comment: Smoked for 30 years  . Sexual activity: Not on file  Lifestyle  . Physical activity    Days per week: Not on file    Minutes per session: Not on file  . Stress: Not on file  Relationships  . Social  coHerbalistn phone: Not on file    Gets together: Not on file    Attends religious service: Not on file    Active member of club or organization: Not on file    Attends meetings of clubs or organizations: Not on file    Relationship status: Not on file  . Intimate partner violence    Fear of current or ex partner: Not on file    Emotionally abused: Not on file    Physically abused: Not on file    Forced sexual activity: Not on file  Other Topics Concern  . Not on file  Social History Narrative  .  Not on file    Review of Systems: Gen: Denies any fever, chills, sweats, anorexia, fatigue, weakness, malaise, weight loss, and sleep disorder CV: Denies chest pain, angina, palpitations, syncope, orthopnea, PND, peripheral edema, and claudication. Resp: Denies dyspnea at rest, dyspnea with exercise, cough, sputum, wheezing, coughing up blood, and pleurisy. GI: Denies vomiting blood, jaundice, and fecal incontinence.   Denies dysphagia or odynophagia. GU : Denies urinary burning, blood in urine, urinary frequency, urinary hesitancy, nocturnal urination, and urinary incontinence. MS: Denies joint pain, limitation of movement, and swelling, stiffness, low back pain, extremity pain. Denies muscle weakness, cramps, atrophy.  Derm: Denies rash, itching, dry skin, hives, moles, warts, or unhealing ulcers.  Psych: Denies depression, anxiety, memory loss, suicidal ideation, hallucinations, paranoia, and confusion. Heme: Denies bruising, bleeding, and enlarged lymph nodes. Neuro:  Denies any headaches, dizziness, paresthesias. Endo:  Denies any problems with DM, thyroid, adrenal function.  Physical Exam: Vital signs in last 24 hours: Temp:  [98 F (36.7 C)-98.9 F (37.2 C)] 98 F (36.7 C) (11/22 0800) Pulse Rate:  [99-128] 121 (11/22 0800) Resp:  [11-28] 15 (11/22 0800) BP: (95-149)/(41-99) 149/73 (11/22 0800) SpO2:  [90 %-100 %] 98 % (11/22 0800) Weight:  [186.3 kg-188.8 kg] 188.8  kg (11/22 0500)   General:   Alert,  Well-developed, well-nourished, obese white male pleasant and cooperative in NAD.  Moaning for water Head:  Normocephalic and atraumatic. Eyes:  Sclera clear, no icterus.   Conjunctiva pink. Ears:  Normal auditory acuity. Nose:  No deformity, discharge,  or lesions. Mouth:  No deformity or lesions.   Neck:  Supple; no masses or thyromegaly. Lungs:  Clear throughout to auscultation.  Decreased breath sounds bases heart:Tachy  Regular rate and rhythm; no murmurs, clicks, rubs,  or gallops. Abdomen: Soft, morbidly obese, bowel sounds present, no focal tenderness, no palpable mass or hepatosplenomegaly.   Rectal:  Deferred  Msk:  Symmetrical without gross deformities. . Pulses:  Normal pulses noted. Extremities: 1+ edema to the knee bilateral Neurologic:  Alert and  oriented x4;  grossly normal neurologically. Skin:  Intact without significant lesions or rashes.. Psych:  Alert and cooperative. Normal mood and affect.  Intake/Output from previous day: 11/21 0701 - 11/22 0700 In: 208.2 [I.V.:8.2; IV Piggyback:200] Out: 800 [Urine:800] Intake/Output this shift: Total I/O In: 19.7 [I.V.:19.7] Out: -   Lab Results: Recent Labs    03/04/19 2322 03/05/19 0600  WBC 42.4* 32.3*  HGB 10.2* 8.8*  HCT 31.6* 27.7*  PLT 234 151   BMET Recent Labs    03/04/19 2322 03/05/19 0052  NA 134* 135  K 3.7 3.8  CL 96* 96*  CO2 24 25  GLUCOSE 178* 173*  BUN 97* 100*  CREATININE 1.52* 1.62*  CALCIUM 8.4* 8.2*   LFT Recent Labs    03/04/19 2322  PROT 5.5*  ALBUMIN 2.9*  AST 41  ALT 33  ALKPHOS 48  BILITOT 0.7   PT/INR Recent Labs    03/04/19 2322  LABPROT 16.0*  INR 1.3*   Hepatitis Panel No results for input(s): HEPBSAG, HCVAB, HEPAIGM, HEPBIGM in the last 72 hours.    IMPRESSION:  #74 63 year old white male with acute upper GI bleed placated by hypovolemic shock . Etiology not clear Patient does have cirrhosis by CT.  Rule out peptic  ulcer disease, rule out variceal bleed, rule out portal gastropathy. Patient is currently hemodynamically stable currently.  However he did develop acute hypotension yesterday and was requiring pressors until this morning   #  2-anemia secondary to acute blood loss  #3 Marked leukocytosis and elevated lactate-possible con current sepsis Rule out pneumonia #4 acute kidney injury #5 new diagnosis of cirrhosis-rule out Melbeta.  No history of EtOH use however does have prior history of drug use and is on Suboxone. We will out hepatitis C/hepatitis B  PLAN: N.p.o. Change Protonix to Protonix infusion Start octreotide infusion Serial hemoglobins and transfuse to keep hemoglobin 7 Patient will need upper endoscopy today.  Patient may need to be intubated prophylactically prior to procedure-we will discuss with CCM  Check hepatitis serologies Need to confirm patient was Covid tested at [name]  03/05/2019, 8:58 AM  I have reviewed the entire case in detail with the above APP and discussed the plan in detail.  Therefore, I agree with the diagnoses recorded above. In addition,  I have personally interviewed and examined the patient and have discussed the case with the patient's ICU nurse and physician.  My additional thoughts are as follows:  Very complex patient with underlying heart disease, recent increase in diuresis, complex hospital stay in [place] with hypotension and then reportedly overt GI bleeding with melena and significant drop in hemoglobin requiring transfusion.  Critical care also feels there is probably a septic component with marked leukocytosis, though the acuity of that is unknown at present.  Peripheral smear findings suggest atypical lymphocytes. At any rate, it is felt that the GI bleeding is the major contributing factor to his persistent hypotension.  He was briefly off pressors this morning, but during my evaluation his blood pressure dropped to 75  systolic with persistent tachycardia at 110-1 15, and Levophed was resumed. His CT scan at [facility] reportedly showed cirrhosis, which was a previously unknown diagnosis as far as we know. So has a history of substance abuse and is on Suboxone, is morbidly obese with a body habitus that would confer a difficult to control airway for endoscopic procedures.  Platelets are dropping, perhaps at least in part from volume resuscitation but also consumption from GI bleeding.  He was mildly coagulopathic, possibly sepsis, underlying cirrhosis, consumption.  Although he reportedly has not had melena since arrival late last night, I am concerned he still has ongoing GI bleeding with his persistent hypotension.  I cannot get any helpful history or review of systems out of this patient as he only continues to ask for water.  I feel he should have upper endoscopy today to rule out an upper GI source of blood loss, especially esophageal or gastric varices.  Given his medical complexity and all the issues noted above, he cannot be safely and adequately sedated with sedation for a bedside procedure.  He is also too unstable to leave the unit and come to the endoscopy department.  Therefore, I have asked Dr. Ander Slade of critical care to electively intubate this patient for the procedure.  He was agreeable, and we have also started Protonix and octreotide drips.  The procedure will be done later this morning after all of these preparations and interventions have been accomplished.  The procedure was described in detail along with risks and benefits in the presence of the patient's critical care nurse  The benefits and risks of the planned procedure were described in detail with the patient or (when appropriate) their health care proxy.  Risks were outlined as including, but not limited to, bleeding, infection, perforation, adverse medication reaction leading to cardiac or pulmonary decompensation, pancreatitis  (if ERCP).  The limitation  of incomplete mucosal visualization was also discussed.  No guarantees or warranties were given. Patient at increased risk for cardiopulmonary complications of procedure due to medical comorbidities.  He remains critically ill.  Patient was agreeable, further plans will follow based on those findings.  Nelida Meuse III Office:[phone]

## 2019-03-05 NOTE — Progress Notes (Signed)
[place] Progress Note Patient Name: Charles Gray DOB: 28-Sep-1955 MRN: [id number]   Date of Service  03/05/2019  HPI/Events of Note  Lactic Acid = 5.1 and CVP = 8.   eICU Interventions  Will order: 1. Bolus with 0.9 NaCl 500 mL IV over 30 minutes now.      Intervention Category Major Interventions: Acid-Base disturbance - evaluation and management  Sommer,Steven Eugene 03/05/2019, 2:46 AM

## 2019-03-05 NOTE — Progress Notes (Signed)
[place] Progress Note Patient Name: Charles Gray DOB: 04/26/1955 MRN: [id number]   Date of Service  03/05/2019  HPI/Events of Note  RN noted lactate last night was 5.1. Case of GI bleed   eICU Interventions  C BC, BMP and lactate to be done now      Intervention Category Minor Interventions: Clinical assessment - ordering diagnostic tests  Margaretmary Lombard 03/05/2019, 9:32 PM

## 2019-03-05 NOTE — Progress Notes (Signed)
eLink Physician-Brief Progress Note Patient Name: Charles Gray DOB: 1955-05-25 MRN: [id number]   Date of Service  03/05/2019  HPI/Events of Note  SBP 90s, CVP1  Bladder scan with 1 liter urine Request for restraints  eICU Interventions  NS bolus x 1 Foley catheter  Soft bilateral wrist restraints ordered while intubated     Intervention Category Major Interventions: Hypotension - evaluation and management Intermediate Interventions: Oliguria - evaluation and management Minor Interventions: Other:  Margaretmary Lombard 03/05/2019, 9:07 PM

## 2019-03-05 NOTE — Procedures (Signed)
Intubation Procedure Note Charles Gray [id number] 06-06-1955  Procedure: Intubation Indications: Airway protection and maintenance  Procedure Details Consent: Risks of procedure as well as the alternatives and risks of each were explained to the (patient/caregiver).  Consent for procedure obtained. Time Out: Verified patient identification, verified procedure, site/side was marked, verified correct patient position, special equipment/implants available, medications/allergies/relevent history reviewed, required imaging and test results available.  Performed  Maximum sterile technique was used including antiseptics, cap, gloves, gown, hand hygiene and mask.  3    Evaluation Hemodynamic Status: BP stable throughout; O2 sats: stable throughout Patient's Current Condition: stable Complications: No apparent complications Patient did tolerate procedure well. Chest X-ray ordered to verify placement.  CXR: pending.   Arthor Gorter A Ariyan Brisendine 03/05/2019

## 2019-03-05 NOTE — Op Note (Addendum)
Procedure:   EGD   Meds:   Per CCM team  Indication:  Massive UGIB  Findings:   Esophagus: normal.  No varices seen   Stomach:  Very large amount clotted and fresh blood and food - > 2 hours clearing stomach to find no active bleeding.   Chain of lesions on a fold in distal fundus with purple discoloration , one with diminutive umbilicated spot.   ? Possible chain of deflated fundic varices .  Remainder of stomach without lesions to explain bleeding.  Mucosal visualization somewhat obscured by old adherent blood, but generally well seen to exclude bleeding sources (I.e. AVM, Dieulafoys, ulcer)   Duodenum:  Old blood - no active bleeding or lesions   Impression:  Gastric source of bleeding, not entirely clear- stopped with IV protonix and octreotide.  Recommendations:  Please keep him intubated and sedated in case there is recurrent bleeding and needs repeat EGD. Continue current meds Serial Hgb and Hct (stable over course of today so far) Dr. Rush Landmark on call tonight and Dr. Fuller Plan will start consult service tomorrow.   Reported cirrhosis on CT at [facility]  - further imaging will be needed here  - plans to follow. Son [name] updated  Dorna Leitz GI Pager [phone]

## 2019-03-05 NOTE — Op Note (Signed)
[facility] Patient Name: Charles Gray Procedure Date : 03/05/2019 MRN: [id number] Attending MD: Estill Cotta. Loletha Carrow , MD Date of Birth: 1955-06-13 CSN: [id number] Age: 63 Admit Type: Inpatient Procedure:                Upper GI endoscopy Indications:              Acute post hemorrhagic anemia, Melena, CTAP from                            outside hospital reportedly showing nodular liver                            contour suggesting cirrhosis) Providers:                Estill Cotta. Loletha Carrow, MD, Josie Dixon, RN, Laverda Sorenson, Technician Referring MD:             PCCM service Medicines:                Sedation managed by critical care service - patient                            intubated and sedated in ICU Complications:            No immediate complications. Estimated Blood Loss:     Estimated blood loss: none. Procedure:                Pre-Anesthesia Assessment:                           - Prior to the procedure, a History and Physical                            was performed, and patient medications and                            allergies were reviewed. The patient's tolerance of                            previous anesthesia was also reviewed. The risks                            and benefits of the procedure and the sedation                            options and risks were discussed with the patient.                            All questions were answered, and informed consent                            was obtained. Prior Anticoagulants: The patient has  taken no previous anticoagulant or antiplatelet                            agents. ASA Grade Assessment: IV - A patient with                            severe systemic disease that is a constant threat                            to life. After reviewing the risks and benefits,                            the patient was deemed in satisfactory condition to                             undergo the procedure.                           After obtaining informed consent, the endoscope was                            passed under direct vision. Throughout the                            procedure, the patient's blood pressure, pulse, and                            oxygen saturations were monitored continuously. The                            GIF-H190 ([id number]) Olympus gastroscope was                            introduced through the mouth, and advanced to the                            second part of duodenum. The upper GI endoscopy was                            performed with difficulty due to a J-shaped stomach                            which made pyloric intubation difficult, excessive                            bleeding, presence of food and the patient's body                            habitus. The patient tolerated the procedure fairly                            well. Scope In: Scope Out: Findings:      The examined esophagus was normal. Specifically, no varices were seen.  A large amount of fresh and clotted blood was found in the gastric       fundus and in the proximal gastric body. Visualization initially quite       limited. Over 2.5 hours and countless passage of the regular and       2-channel scopes, using multiple devices, the stomach was fairly well       cleared to reveal no active bleeding or definite culprit lesion upon       which intervention could be taken. Removal of food was also accomplished       with a Rescue net.      Congested mucosa was found in the gastric fundus. This was a chain of       edematous mucosa in the distal fundus along greater curve/anterior wall       with several violaceous submucosal prominences, one of which had a small       umbilication with flat black spot on it. This area was hard to see well       or photograph due to scope looping (J-shaped stomach).      The examined duodenum was  normal. Impression:               - Normal esophagus.                           - Red blood in the gastric fundus and in the                            gastric body.                           - Congestive gastropathy vs possible chain of                            fundic varices that decompressed with octreotide                            and hypotension.                           - Normal examined duodenum.                           - No specimens collected.                           Patient's son [name] updated. Recommendation:           - Remain intubated in ICU at least overnight. GI                            service to see tomorrow. Call sooner if needed.                           Continue octreotide and protonix drips                           serial Hgb/hct  More workup/imaging of liver condition forthcoming Procedure Code(s):        --- Professional ---                           [id number], Esophagogastroduodenoscopy, flexible,                            transoral; with removal of foreign body(s) Diagnosis Code(s):        --- Professional ---                           K92.2, Gastrointestinal hemorrhage, unspecified                           K31.89, Other diseases of stomach and duodenum                           D62, Acute posthemorrhagic anemia                           K92.1, Melena (includes Hematochezia) CPT copyright 2019 American Medical Association. All rights reserved. The codes documented in this report are preliminary and upon coder review may  be revised to meet current compliance requirements. Henry L. Loletha Carrow, MD 03/05/2019 4:12:35 PM This report has been signed electronically. Number of Addenda: 0

## 2019-03-05 NOTE — Progress Notes (Signed)
Discussed with GI  Will plan for endoscopy  Patient electively intubated for procedure

## 2019-03-05 NOTE — Progress Notes (Signed)
Endotracheal Intubation Procedure Note  Indication for endotracheal intubation: Airway protection. Airway Assessment: Mallampati Class: II (hard and soft palate, upper portion of tonsils anduvula visible). Sedation: etomidate, fentanyl and midazolam. Paralytic: rocuronium. Lidocaine: no. Atropine: no. Equipment: Macintosh 3 laryngoscope blade. Cricoid Pressure: no. Number of attempts: 1. ETT location confirmed by by auscultation, by CXR and ETCO2 monitor.  Charles Gray 03/05/2019

## 2019-03-05 NOTE — Interval H&P Note (Signed)
History and Physical Interval Note:  03/05/2019 12:59 PM  Charles Gray  has presented today for surgery, with the diagnosis of GI bleed.  The various methods of treatment have been discussed with the patient and family. After consideration of risks, benefits and other options for treatment, the patient has consented to  Procedure(s): ESOPHAGOGASTRODUODENOSCOPY (EGD) WITH PROPOFOL (N/A) as a surgical intervention.  The patient's history has been reviewed, patient examined, no change in status, stable for surgery.  I have reviewed the patient's chart and labs.  Questions were answered to the patient's satisfaction.    Patient remains hypotensive and tachycardic, sedated on ventilator.   Nelida Meuse III

## 2019-03-05 NOTE — Progress Notes (Signed)
  Echocardiogram 2D Echocardiogram has been performed.  Charles Gray 03/05/2019, 4:52 PM

## 2019-03-06 ENCOUNTER — Encounter (HOSPITAL_COMMUNITY): Payer: Self-pay | Admitting: *Deleted

## 2019-03-06 ENCOUNTER — Encounter (HOSPITAL_COMMUNITY)
Admission: AD | Disposition: A | Discharge status: Rehabilitation Facility | Payer: Self-pay | Source: Other Acute Inpatient Hospital | Attending: Internal Medicine

## 2019-03-06 DIAGNOSIS — K766 Portal hypertension: Secondary | ICD-10-CM

## 2019-03-06 DIAGNOSIS — K3189 Other diseases of stomach and duodenum: Secondary | ICD-10-CM

## 2019-03-06 DIAGNOSIS — J9601 Acute respiratory failure with hypoxia: Secondary | ICD-10-CM

## 2019-03-06 DIAGNOSIS — K259 Gastric ulcer, unspecified as acute or chronic, without hemorrhage or perforation: Secondary | ICD-10-CM

## 2019-03-06 HISTORY — DX: Other diseases of stomach and duodenum: K31.89

## 2019-03-06 HISTORY — DX: Portal hypertension: K76.6

## 2019-03-06 HISTORY — PX: ESOPHAGOGASTRODUODENOSCOPY (EGD) WITH PROPOFOL: SHX5813

## 2019-03-06 LAB — CBC
HCT: 24.2 % — ABNORMAL LOW (ref 39.0–52.0)
Hemoglobin: 7.9 g/dL — ABNORMAL LOW (ref 13.0–17.0)
MCH: 29.9 pg (ref 26.0–34.0)
MCHC: 32.6 g/dL (ref 30.0–36.0)
MCV: 91.7 fL (ref 80.0–100.0)
Platelets: 159 10*3/uL (ref 150–400)
RBC: 2.64 MIL/uL — ABNORMAL LOW (ref 4.22–5.81)
RDW: 15.6 % — ABNORMAL HIGH (ref 11.5–15.5)
WBC: 30.8 10*3/uL — ABNORMAL HIGH (ref 4.0–10.5)
nRBC: 3.9 % — ABNORMAL HIGH (ref 0.0–0.2)

## 2019-03-06 LAB — CBC WITH DIFFERENTIAL/PLATELET
Abs Immature Granulocytes: 1.4 10*3/uL — ABNORMAL HIGH (ref 0.00–0.07)
Band Neutrophils: 1 %
Basophils Absolute: 0.9 10*3/uL — ABNORMAL HIGH (ref 0.0–0.1)
Basophils Relative: 2 %
Eosinophils Absolute: 0 10*3/uL (ref 0.0–0.5)
Eosinophils Relative: 0 %
HCT: 24.6 % — ABNORMAL LOW (ref 39.0–52.0)
Hemoglobin: 7.9 g/dL — ABNORMAL LOW (ref 13.0–17.0)
Lymphocytes Relative: 7 %
Lymphs Abs: 3.2 10*3/uL (ref 0.7–4.0)
MCH: 29.3 pg (ref 26.0–34.0)
MCHC: 32.1 g/dL (ref 30.0–36.0)
MCV: 91.1 fL (ref 80.0–100.0)
Metamyelocytes Relative: 1 %
Monocytes Absolute: 1.8 10*3/uL — ABNORMAL HIGH (ref 0.1–1.0)
Monocytes Relative: 4 %
Myelocytes: 1 %
Neutro Abs: 37.8 10*3/uL — ABNORMAL HIGH (ref 1.7–7.7)
Neutrophils Relative %: 83 %
Platelets: 213 10*3/uL (ref 150–400)
Promyelocytes Relative: 1 %
RBC: 2.7 MIL/uL — ABNORMAL LOW (ref 4.22–5.81)
RDW: 15.2 % (ref 11.5–15.5)
WBC: 45 10*3/uL — ABNORMAL HIGH (ref 4.0–10.5)
nRBC: 15 /100 WBC — ABNORMAL HIGH
nRBC: 6.9 % — ABNORMAL HIGH (ref 0.0–0.2)

## 2019-03-06 LAB — GLUCOSE, CAPILLARY
Glucose-Capillary: 154 mg/dL — ABNORMAL HIGH (ref 70–99)
Glucose-Capillary: 155 mg/dL — ABNORMAL HIGH (ref 70–99)
Glucose-Capillary: 159 mg/dL — ABNORMAL HIGH (ref 70–99)
Glucose-Capillary: 175 mg/dL — ABNORMAL HIGH (ref 70–99)
Glucose-Capillary: 189 mg/dL — ABNORMAL HIGH (ref 70–99)
Glucose-Capillary: 195 mg/dL — ABNORMAL HIGH (ref 70–99)
Glucose-Capillary: 203 mg/dL — ABNORMAL HIGH (ref 70–99)

## 2019-03-06 LAB — ECHOCARDIOGRAM COMPLETE
Height: 82 in
Weight: 6659.66 oz

## 2019-03-06 LAB — HEMOGLOBIN A1C
Hgb A1c MFr Bld: 5.3 % (ref 4.8–5.6)
Mean Plasma Glucose: 105.41 mg/dL

## 2019-03-06 LAB — TRIGLYCERIDES: Triglycerides: 328 mg/dL — ABNORMAL HIGH (ref <150)

## 2019-03-06 LAB — CALCIUM, IONIZED: Calcium, Ionized, Serum: 4.6 mg/dL (ref 4.5–5.6)

## 2019-03-06 SURGERY — ESOPHAGOGASTRODUODENOSCOPY (EGD) WITH PROPOFOL
Anesthesia: Moderate Sedation

## 2019-03-06 MED ORDER — PANTOPRAZOLE SODIUM 40 MG IV SOLR
40.0000 mg | Freq: Two times a day (BID) | INTRAVENOUS | Status: DC
  Administered 2019-03-06 – 2019-03-09 (×8): 40 mg via INTRAVENOUS
  Filled 2019-03-06 (×8): qty 40

## 2019-03-06 MED ORDER — INSULIN ASPART 100 UNIT/ML ~~LOC~~ SOLN
0.0000 [IU] | SUBCUTANEOUS | Status: DC
  Administered 2019-03-06 (×5): 2 [IU] via SUBCUTANEOUS
  Administered 2019-03-07 (×2): 1 [IU] via SUBCUTANEOUS
  Administered 2019-03-07: 2 [IU] via SUBCUTANEOUS
  Administered 2019-03-07 – 2019-03-08 (×3): 1 [IU] via SUBCUTANEOUS
  Administered 2019-03-08: 2 [IU] via SUBCUTANEOUS
  Administered 2019-03-08: 1 [IU] via SUBCUTANEOUS

## 2019-03-06 MED ORDER — LACTATED RINGERS IV BOLUS
500.0000 mL | Freq: Once | INTRAVENOUS | Status: AC
  Administered 2019-03-06: 500 mL via INTRAVENOUS

## 2019-03-06 MED ORDER — ORAL CARE MOUTH RINSE
15.0000 mL | Freq: Two times a day (BID) | OROMUCOSAL | Status: DC
  Administered 2019-03-07 – 2019-03-10 (×7): 15 mL via OROMUCOSAL

## 2019-03-06 MED ORDER — CHLORHEXIDINE GLUCONATE 0.12 % MT SOLN
15.0000 mL | Freq: Two times a day (BID) | OROMUCOSAL | Status: DC
  Administered 2019-03-06 – 2019-03-13 (×14): 15 mL via OROMUCOSAL
  Filled 2019-03-06 (×11): qty 15

## 2019-03-06 MED ORDER — FENTANYL CITRATE (PF) 100 MCG/2ML IJ SOLN
INTRAMUSCULAR | Status: AC
  Filled 2019-03-06: qty 2

## 2019-03-06 MED ORDER — MIDAZOLAM HCL (PF) 5 MG/ML IJ SOLN
INTRAMUSCULAR | Status: AC
  Filled 2019-03-06: qty 2

## 2019-03-06 MED ORDER — CHLORHEXIDINE GLUCONATE CLOTH 2 % EX PADS
6.0000 | MEDICATED_PAD | Freq: Every day | CUTANEOUS | Status: DC
  Administered 2019-03-06 – 2019-03-13 (×8): 6 via TOPICAL

## 2019-03-06 SURGICAL SUPPLY — 15 items

## 2019-03-06 NOTE — Progress Notes (Signed)
[facility] Progress Note Patient Name: Charles Gray DOB: 1955/09/06 MRN: [id number]   Date of Service  03/06/2019  HPI/Events of Note  Patient remains on norepinephrine, mildly tachycardic. CVP 1 on positive pressure ventilation, obese  eICU Interventions  Ordered a 500 cc LR bolus and observe response     Intervention Category Major Interventions: Hypotension - evaluation and management  Judd Lien 03/06/2019, 2:41 AM

## 2019-03-06 NOTE — Procedures (Signed)
Extubation Procedure Note  Patient Details:   Name: Charles Gray DOB: 1955/07/12 MRN: [id number]   Airway Documentation:    Vent end date: 03/06/19 Vent end time: 1103   Evaluation  O2 sats: stable throughout Complications: No apparent complications Patient did tolerate procedure well. Bilateral Breath Sounds: Diminished   Patient extubated himself while he was being weaned to extubate. Placed on 3L [place]. NP at bedside & said to just watch him for now. Patient able to speak following extubation.    Kathie Dike 03/06/2019, 11:09 AM

## 2019-03-06 NOTE — Op Note (Signed)
[facility] Patient Name: Charles Gray Procedure Date : 03/06/2019 MRN: [id number] Attending MD: Thornton Park MD, MD Date of Birth: 1955-06-22 CSN: [id number] Age: 63 Admit Type: Inpatient Procedure:                Upper GI endoscopy Indications:              Recent gastrointestinal bleeding Providers:                Thornton Park, MD Jobe Igo, RN, Marguerita Merles, Technician, Thornton Park MD, MD Referring MD:              Medicines:                None Complications:            No immediate complications. Estimated Blood Loss:     Estimated blood loss: none. Procedure:                Pre-Anesthesia Assessment:                           - Prior to the procedure, a History and Physical                            was performed, and patient medications and                            allergies were reviewed. The patient's tolerance of                            previous anesthesia was also reviewed. The risks                            and benefits of the procedure and the sedation                            options and risks were discussed with the patient.                            All questions were answered, and informed consent                            was obtained. Prior Anticoagulants: The patient has                            taken no previous anticoagulant or antiplatelet                            agents. ASA Grade Assessment: III - A patient with                            severe systemic disease. After reviewing the risks  and benefits, the patient was deemed in                            satisfactory condition to undergo the procedure.                           After obtaining informed consent, the endoscope was                            passed under direct vision. Throughout the                            procedure, the patient's blood pressure, pulse, and    oxygen saturations were monitored continuously. The                            GIF-H190 ([id number]) Olympus gastroscope was                            introduced through the mouth, and advanced to the                            third part of duodenum. The upper GI endoscopy was                            accomplished without difficulty. The patient                            tolerated the procedure well. Scope In: Scope Out: Findings:      The examined esophagus was normal. No esophageal varices.      Mild portal hypertensive gastropathy was found in the gastric body. No       gastric varices. No blood or heme present.      One non-bleeding cratered gastric ulcer with no stigmata of bleeding was       found in the gastric fundus in an area of localized erythema. The lesion       was 3 mm in largest dimension.      The examined duodenum was normal. No blood or heme present. Impression:               - Normal esophagus.                           - Very mild portal hypertensive gastropathy.                           - No esophageal or gastric varices.                           - Small, non-bleeding gastric ulcer with no                            stigmata of bleeding in an area of localized  erythema. Location is suspicious for NG tube                            trauma. Did he have an NG tube at the outside                            hospital?                           - Normal examined duodenum.                           - No specimens collected.                           - No blood or heme seen. Recommendation:           - Advance diet as tolerated.                           - Continue present medications. Convert                            pantoprazole continuous infusion to IV BID.                           - No inpatient repeat endoscopy planned at this                            time. May proceed with extubation.                           - Serial hgb/hct  with transfusion as indicated.                           - Consider repeat endoscopy in 8 weeks to assess                            for healing of the gastric ulcer given the unusual                            location.                           - Consider abdominal imaging following extubation                            to further evaluate for cirrhosis and/or findings                            of portal hypertension. Procedure Code(s):        --- Professional ---                           [phone], Esophagogastroduodenoscopy, flexible,  transoral; diagnostic, including collection of                            specimen(s) by brushing or washing, when performed                            (separate procedure) Diagnosis Code(s):        --- Professional ---                           K76.6, Portal hypertension                           K31.89, Other diseases of stomach and duodenum                           K25.9, Gastric ulcer, unspecified as acute or                            chronic, without hemorrhage or perforation                           K92.2, Gastrointestinal hemorrhage, unspecified CPT copyright 2019 American Medical Association. All rights reserved. The codes documented in this report are preliminary and upon coder review may  be revised to meet current compliance requirements. Thornton Park MD, MD 03/06/2019 11:36:22 AM This report has been signed electronically. Number of Addenda: 0

## 2019-03-06 NOTE — Progress Notes (Signed)
eLink Physician-Brief Progress Note Patient Name: Fredric Slabach DOB: 07-Feb-1956 MRN: [id number]   Date of Service  03/06/2019  HPI/Events of Note  Notified of glucose 200s. Not a known diabetic and not on TF. Creatinine 1.52.  eICU Interventions  Ordered to start low dose SSI q 4     Intervention Category Major Interventions: Hyperglycemia - active titration of insulin therapy  Judd Lien 03/06/2019, 4:34 AM

## 2019-03-06 NOTE — Progress Notes (Addendum)
NAME:  Charles Gray, MRN:  [id number], DOB:  12-01-1955, LOS: 2 ADMISSION DATE:  03/04/2019, CONSULTATION DATE:  03/06/19  CHIEF COMPLAINT:  GIB   Brief History   63 y.o. M with PMH of HTN, HL, Asthma and Cirrhosis, DDD who presented to [facility] on 03/02/19 for worsening dyspnea and leg edema and was treated with Lasix.  During his hospital course he had acutely dropping BP and Hgb with melanotic stool.  He also had increasing leukocytosis and lactic acidosis, but was afebrile and treated with Vanc, Aztreonam and Levaquin.   He had a CT abdomen/pelvis and abdominal CTA without obvious source of bleeding.  Hgb 14 on admission, dropped to 7.5.  He was given 3 units PRBC's and started on protonix and transfer requested to [facility].   Past Medical History   has a past medical history of Fatty liver disease, nonalcoholic, Hyperlipidemia (01/24/2019), Hypertension (01/24/2019), Insomnia (01/24/2019), and Lumbar disc herniation (01/24/2019).   Significant Hospital Events   03/02/19 Admitted to [facility] 03/04/19 Transfer to [facility] ICU  Consults:  Gastroenterology  Procedures:  Central Line placed at [place] ETT 11/22 >  Significant Diagnostic Tests:  11/19 RUQ US>>hepatic steatosis, minimal portal vein flow, some degree of portal vein thrombosis not excluded however may be artifact 11/19 CXR>>no active cardiopulmonary disease 11/19 CTA Chest>>No PE, scattered mediastinal nodes without overt mass or adenopathy 11/21 CTA abd/pelvis>>no contrast accumulation, nodular hepatic contours 11/22 Echo >>>  Micro Data:  11/19 Sars-CoV-2>>negative Oval [name]) 11/21>>BCx2 C dif  Antimicrobials:  Levaquin 11/21 only Aztreonam 11/21 only Vancomycin 11/21 only Rocephin 11/22- Flagyl 11/22-  Interim history/subjective:  Decompensated yesterday requiring intubation. Had EGD done with no identified bleeding source. No acute events overnight.   Objective   Blood pressure (!)  101/46, pulse 91, temperature 99.5 F (37.5 C), temperature source Oral, resp. rate 18, height 6' 10"  (2.083 m), weight (!) 188.9 kg, SpO2 98 %. CVP:  [1 mmHg] 1 mmHg  Vent Mode: PRVC FiO2 (%):  [40 %-100 %] 40 % Set Rate:  [18 bmp] 18 bmp Vt Set:  [650 mL] 650 mL PEEP:  [5 cmH20] 5 cmH20 Plateau Pressure:  [16 cmH20] 16 cmH20   Intake/Output Summary (Last 24 hours) at 03/06/2019 0745 Last data filed at 03/06/2019 0700 Gross per 24 hour  Intake 5258.03 ml  Output 3355 ml  Net 1903.03 ml   Filed Weights   03/04/19 2251 03/05/19 0500 03/06/19 0500  Weight: (!) 186.3 kg (!) 188.8 kg (!) 188.9 kg    General:  Morbidly obese male on vent Neuro:  Sedated.  HEENT:  Charles Gray/AT, No JVD noted, PERRL. ETT. Cardiovascular:  RRR, no MRG Lungs:  Clear bilateral Abdomen:  Soft, non-distended, non-tender Musculoskeletal:  No acute deformity Skin:  Intact, MMM   Resolved Hospital Problem list     Assessment & Plan:   Hypovolemic/hemorrhagic shock, Cannot rule out a component of septic shock. Likely sedation driven, at least, in part.  - Telemetry monitoring - Check CVP, may need more volume - Levophed to keep MAP > 70mHg - Lactic clearing - Trend HGB - Wean sedation as able.   Gastrointestinal hemorrhage: Gastric source most likely per EGD, but no focus discovered.  - Continue protonix  - Continue octreotide - GI following, requesting continued vent support to ensure no further bleeding.  - NPO  Acute hypoxemic respiratory failure Probable pneumonia, profound leukocytosis.  - Full vent support. Will await GI eval prior to weaning. VAP bundle. - Continue Rocephin, Flagyl -  Follow radiological changes intermittently - Wean propofol to off given high triglycerides. Have increased fentanyl ceiling to facilitate this.   History of hypertension/hyperlipidemia -Follow echocardiogram -Hold lisinopril at present  Acute kidney injury -Likely related to hypotension/shock -Trend  electrolytes -Avoid nephrotoxic's  Hepatic steatosis Likely related to [name] -No history of alcohol use -Trend LFTs  Chronic pain -Home Suboxone on hold in the context of shock. Add back ASAP to facilitate weaning.   Best practice:  Diet: N.p.o. Pain/Anxiety/Delirium protocol (if indicated):Fent Prop for RASS -2 VAP protocol (if indicated): Yes DVT prophylaxis: SCD's  GI prophylaxis: PPI infusion Glucose control: SSI Mobility:BR Code Status: Full Family Communication: Son [name] updated.  Disposition:  ICU  Labs   CBC: Recent Labs  Lab 03/04/19 2322 03/05/19 0600 03/05/19 1430 03/05/19 1620 03/05/19 2216  WBC 42.4* 32.3*  --  47.9* 45.0*  NEUTROABS 30.0*  --   --   --  37.8*  HGB 10.2* 8.8* 8.9* 8.6* 7.9*  HCT 31.6* 27.7* 26.6* 25.6* 24.6*  MCV 90.3 90.2  --  88.9 91.1  PLT 234 151  --  220 924    Basic Metabolic Panel: Recent Labs  Lab 02/27/19 1106 03/04/19 2322 03/05/19 0052 03/05/19 2216  NA 146* 134* 135 138  K 4.1 3.7 3.8 3.6  CL 102 96* 96* 104  CO2 22 24 25 24   GLUCOSE 99 178* 173* 179*  BUN 12 97* 100* 100*  CREATININE 0.84 1.52* 1.62* 1.52*  CALCIUM 9.4 8.4* 8.2* 7.8*  MG 2.2 1.9  --   --   PHOS  --  2.1*  --   --    GFR: Estimated Creatinine Clearance: 95.6 mL/min (A) (by C-G formula based on SCr of 1.52 mg/dL (H)). Recent Labs  Lab 03/04/19 2322 03/04/19 2324 03/05/19 0600 03/05/19 1620 03/05/19 2211 03/05/19 2216  WBC 42.4*  --  32.3* 47.9*  --  45.0*  LATICACIDVEN  --  5.1*  --   --  3.2*  --     Liver Function Tests: Recent Labs  Lab 03/04/19 2322  AST 41  ALT 33  ALKPHOS 48  BILITOT 0.7  PROT 5.5*  ALBUMIN 2.9*   Recent Labs  Lab 03/04/19 2329  LIPASE 48   No results for input(s): AMMONIA in the last 168 hours.  ABG No results found for: PHART, PCO2ART, PO2ART, HCO3, TCO2, ACIDBASEDEF, O2SAT   Coagulation Profile: Recent Labs  Lab 03/04/19 2322 03/05/19 1430  INR 1.3* 1.3*    Cardiac Enzymes: No results  for input(s): CKTOTAL, CKMB, CKMBINDEX, TROPONINI in the last 168 hours.  HbA1C: Hgb A1c MFr Bld  Date/Time Value Ref Range Status  03/06/2019 06:14 AM 5.3 4.8 - 5.6 % Final    Comment:    (NOTE) Pre diabetes:          5.7%-6.4% Diabetes:              >6.4% Glycemic control for   <7.0% adults with diabetes     CBG: Recent Labs  Lab 03/05/19 1551 03/05/19 1923 03/06/19 0016 03/06/19 0350 03/06/19 0736  GLUCAP 193* 159* 189* 203* 175*   CRITICAL CARE Performed by: Corey Harold   Total critical care time: 40 minutes  Critical care time was exclusive of separately billable procedures and treating other patients.  Critical care was necessary to treat or prevent imminent or life-threatening deterioration.  Critical care was time spent personally by me on the following activities: development of treatment plan with patient and/or  surrogate as well as nursing, discussions with consultants, evaluation of patient's response to treatment, examination of patient, obtaining history from patient or surrogate, ordering and performing treatments and interventions, ordering and review of laboratory studies, ordering and review of radiographic studies, pulse oximetry and re-evaluation of patient's condition.  Georgann Housekeeper, AGACNP-BC [place]  See Amion for personal pager PCCM on call pager [phone]  03/06/2019 8:04 AM

## 2019-03-06 NOTE — Interval H&P Note (Signed)
History and Physical Interval Note:  03/06/2019 10:14 AM  Charles Gray  has presented today for surgery, with the diagnosis of Anemia.  The various methods of treatment have been discussed with the patient and family. After consideration of risks, benefits and other options for treatment, the patient has consented to  Procedure(s): ESOPHAGOGASTRODUODENOSCOPY (EGD) WITH PROPOFOL (N/A) as a surgical intervention.  The patient's history has been reviewed, patient examined, no change in status, stable for surgery.  I have reviewed the patient's chart and labs.  Questions were answered to the patient's satisfaction.     Thornton Park

## 2019-03-07 ENCOUNTER — Inpatient Hospital Stay (HOSPITAL_COMMUNITY): Payer: Medicare Other

## 2019-03-07 DIAGNOSIS — K254 Chronic or unspecified gastric ulcer with hemorrhage: Secondary | ICD-10-CM

## 2019-03-07 LAB — AMMONIA: Ammonia: 31 umol/L (ref 9–35)

## 2019-03-07 LAB — COMPREHENSIVE METABOLIC PANEL
ALT: 94 U/L — ABNORMAL HIGH (ref 0–44)
AST: 96 U/L — ABNORMAL HIGH (ref 15–41)
Albumin: 2.8 g/dL — ABNORMAL LOW (ref 3.5–5.0)
Alkaline Phosphatase: 51 U/L (ref 38–126)
Anion gap: 8 (ref 5–15)
BUN: 56 mg/dL — ABNORMAL HIGH (ref 8–23)
CO2: 26 mmol/L (ref 22–32)
Calcium: 8.3 mg/dL — ABNORMAL LOW (ref 8.9–10.3)
Chloride: 109 mmol/L (ref 98–111)
Creatinine, Ser: 1.24 mg/dL (ref 0.61–1.24)
GFR calc Af Amer: >60 mL/min (ref >60)
GFR calc non Af Amer: >60 mL/min (ref >60)
Glucose, Bld: 164 mg/dL — ABNORMAL HIGH (ref 70–99)
Potassium: 3.4 mmol/L — ABNORMAL LOW (ref 3.5–5.1)
Sodium: 143 mmol/L (ref 135–145)
Total Bilirubin: 0.6 mg/dL (ref 0.3–1.2)
Total Protein: 5.3 g/dL — ABNORMAL LOW (ref 6.5–8.1)

## 2019-03-07 LAB — GLUCOSE, CAPILLARY
Glucose-Capillary: 148 mg/dL — ABNORMAL HIGH (ref 70–99)
Glucose-Capillary: 152 mg/dL — ABNORMAL HIGH (ref 70–99)
Glucose-Capillary: 125 mg/dL — ABNORMAL HIGH (ref 70–99)
Glucose-Capillary: 127 mg/dL — ABNORMAL HIGH (ref 70–99)
Glucose-Capillary: 118 mg/dL — ABNORMAL HIGH (ref 70–99)
Glucose-Capillary: 146 mg/dL — ABNORMAL HIGH (ref 70–99)

## 2019-03-07 LAB — CBC
HCT: 24.6 % — ABNORMAL LOW (ref 39.0–52.0)
Hemoglobin: 7.9 g/dL — ABNORMAL LOW (ref 13.0–17.0)
MCH: 29.9 pg (ref 26.0–34.0)
MCHC: 32.1 g/dL (ref 30.0–36.0)
MCV: 93.2 fL (ref 80.0–100.0)
Platelets: 163 10*3/uL (ref 150–400)
RBC: 2.64 MIL/uL — ABNORMAL LOW (ref 4.22–5.81)
RDW: 16.1 % — ABNORMAL HIGH (ref 11.5–15.5)
WBC: 27.2 10*3/uL — ABNORMAL HIGH (ref 4.0–10.5)
nRBC: 3.3 % — ABNORMAL HIGH (ref 0.0–0.2)

## 2019-03-07 LAB — HAPTOGLOBIN: Haptoglobin: 101 mg/dL (ref 32–363)

## 2019-03-07 LAB — TRIGLYCERIDES: Triglycerides: 161 mg/dL — ABNORMAL HIGH (ref <150)

## 2019-03-07 LAB — PATHOLOGIST SMEAR REVIEW: Path Review: 11232020

## 2019-03-07 LAB — MAGNESIUM: Magnesium: 2.4 mg/dL (ref 1.7–2.4)

## 2019-03-07 LAB — PHOSPHORUS: Phosphorus: 1.6 mg/dL — ABNORMAL LOW (ref 2.5–4.6)

## 2019-03-07 MED ORDER — BUPRENORPHINE HCL-NALOXONE HCL 8-2 MG SL SUBL
2.0000 | SUBLINGUAL_TABLET | Freq: Every day | SUBLINGUAL | Status: DC
  Administered 2019-03-08 – 2019-03-13 (×6): 2 via SUBLINGUAL
  Filled 2019-03-07 (×6): qty 2

## 2019-03-07 MED ORDER — VITAL HIGH PROTEIN PO LIQD
1000.0000 mL | ORAL | Status: DC
  Administered 2019-03-07: 1000 mL

## 2019-03-07 MED ORDER — POTASSIUM PHOSPHATES 15 MMOLE/5ML IV SOLN
20.0000 mmol | Freq: Once | INTRAVENOUS | Status: AC
  Administered 2019-03-07: 20 mmol via INTRAVENOUS
  Filled 2019-03-07: qty 6.67

## 2019-03-07 MED ORDER — GABAPENTIN 250 MG/5ML PO SOLN
800.0000 mg | Freq: Three times a day (TID) | ORAL | Status: DC
  Administered 2019-03-07 – 2019-03-08 (×5): 800 mg
  Filled 2019-03-07 (×8): qty 18

## 2019-03-07 MED ORDER — MORPHINE SULFATE (PF) 2 MG/ML IV SOLN
1.0000 mg | INTRAVENOUS | Status: AC | PRN
  Administered 2019-03-07: 1 mg via INTRAVENOUS
  Filled 2019-03-07: qty 1

## 2019-03-07 MED ORDER — PRO-STAT SUGAR FREE PO LIQD
30.0000 mL | Freq: Two times a day (BID) | ORAL | Status: DC
  Administered 2019-03-07 – 2019-03-13 (×9): 30 mL
  Filled 2019-03-07 (×11): qty 30

## 2019-03-07 MED ORDER — LISINOPRIL 20 MG PO TABS
30.0000 mg | ORAL_TABLET | Freq: Every day | ORAL | Status: DC
  Administered 2019-03-07 – 2019-03-13 (×6): 30 mg
  Filled 2019-03-07 (×7): qty 1

## 2019-03-07 NOTE — Progress Notes (Addendum)
Daily Rounding Note  03/07/2019, 12:42 PM  LOS: 3 days   SUBJECTIVE:   Chief complaint: upper GI Bleed  No stools recoreded/reported.  No vomiting.   Self extubated yesterday.  Agitated with AMS, in soft restraints.    Head CT is negative.  Normal ammonia level at 31.    OBJECTIVE:         Vital signs in last 24 hours:    Temp:  [98.6 F (37 C)-100.3 F (37.9 C)] 99.2 F (37.3 C) (11/24 0800) Pulse Rate:  [96-128] 113 (11/24 1100) Resp:  [14-21] 16 (11/24 1100) BP: (97-159)/(42-96) 155/86 (11/24 1100) SpO2:  [95 %-100 %] 100 % (11/24 1100) Weight:  [184.1 kg] 184.1 kg (11/24 0500) Last BM Date: (pta) Filed Weights   03/05/19 0500 03/06/19 0500 03/07/19 0500  Weight: (!) 188.8 kg (!) 188.9 kg (!) 184.1 kg   General: obese.  Groaning/moaning   Heart: Regular, tachy ~ 115 Chest: clear in front, no labored breathing Abdomen: obese, soft,NT, hypoactive BS.    Extremities: no pedal edema Neuro/Psych:  Groaning.  Altered.  Unable to say year, place, answers yes to most questions.    Intake/Output from previous day: 11/23 0701 - 11/24 0700 In: 1013.2 [I.V.:379.6; IV Piggyback:633.6] Out: 2660 [Urine:2660]  Intake/Output this shift: Total I/O In: 341.6 [IV Piggyback:341.6] Out: 900 [Urine:900]  Lab Results: Recent Labs    03/05/19 2216 03/06/19 1622 03/07/19 0119  WBC 45.0* 30.8* 27.2*  HGB 7.9* 7.9* 7.9*  HCT 24.6* 24.2* 24.6*  PLT 213 159 163   BMET Recent Labs    03/05/19 0052 03/05/19 2216 03/07/19 0119  NA 135 138 143  K 3.8 3.6 3.4*  CL 96* 104 109  CO2 25 24 26   GLUCOSE 173* 179* 164*  BUN 100* 100* 56*  CREATININE 1.62* 1.52* 1.24  CALCIUM 8.2* 7.8* 8.3*   LFT Recent Labs    03/04/19 2322 03/07/19 0119  PROT 5.5* 5.3*  ALBUMIN 2.9* 2.8*  AST 41 96*  ALT 33 94*  ALKPHOS 48 51  BILITOT 0.7 0.6   PT/INR Recent Labs    03/04/19 2322 03/05/19 1430  LABPROT 16.0* 16.3*  INR  1.3* 1.3*   Hepatitis Panel No results for input(s): HEPBSAG, HCVAB, HEPAIGM, HEPBIGM in the last 72 hours.  Studies/Results: Ct Head Wo Contrast  Result Date: 03/07/2019 CLINICAL DATA:  Onset right sided headache today. EXAM: CT HEAD WITHOUT CONTRAST TECHNIQUE: Contiguous axial images were obtained from the base of the skull through the vertex without intravenous contrast. COMPARISON:  None. FINDINGS: Brain: No evidence of acute infarction, hemorrhage, hydrocephalus, extra-axial collection or mass lesion/mass effect. Vascular: No hyperdense vessel or unexpected calcification. Skull: Normal. Negative for fracture or focal lesion. Sinuses/Orbits: Mild mucosal thickening right maxillary sinus noted. Other: None. IMPRESSION: Normal head CT. Electronically Signed   By: Inge Rise M.D.   On: 03/07/2019 11:44   Scheduled Meds: . chlorhexidine  15 mL Mouth Rinse BID  . Chlorhexidine Gluconate Cloth  6 each Topical Q0600  . insulin aspart  0-9 Units Subcutaneous Q4H  . mouth rinse  15 mL Mouth Rinse 10 times per day  . mouth rinse  15 mL Mouth Rinse q12n4p  . pantoprazole (PROTONIX) IV  40 mg Intravenous Q12H  . pravastatin  20 mg Oral Daily   Continuous Infusions: . cefTRIAXone (ROCEPHIN)  IV Stopped (03/06/19 2218)   PRN Meds:.albuterol, haloperidol lactate, ondansetron (ZOFRAN) IV   ASSESMENT:   *  Massive upper GI bleed w hypovolemic shock.  Black stools at outside hospital. 03/05/2019 EGD.  Gastric source of bleeding of unclear locus.  MD spent greater than 2.5 hours clearing stomach of fresh blood and food but did not find active bleeding.  There was a chain of lesions on a fold in the distal gastric fundus with purple discoloration, 1 with a diminutive umbilicated spot (? change of deflated fundic varices?) 03/06/2019 EGD.  No esophageal or gastric varices.  Very mild portal hypertensive gastropathy.  Small, nonbleeding gastric ulcer with no bleeding stigmata in an area of  localized erythema suspicious for NG tube trauma. Bid IV Protonix in place.   Consider repeat EGD in 2 months to assess for healing of the gastric ulcer given the unusual location.  *    Hepatic steatosis on abdominal ultrasound and CTAP in [place] Reported history cirrhosis and fatty liver. Mild elevation of Transaminases, normal bili and alk phos  *    Transfusion requiring anemia.  3 PRBCs at [place].  No transfusions at [facility] hospital since admission 11/21.  Hgb stable at 7.9.    *    AKI.  *    Acute encephalopathy.  CT head today, 11/24, normal  *     Leukocytosis.  Bibasilar atelectasis vs infiltrate, cardiomegaly and vascular congestion on CXR.  PLAN   *   Supportive care.  Switch to Protonix po when able to safely swallow.  Currently NPO due to AMS.    Azucena Freed  03/07/2019, 12:42 PM Phone [phone]    Attending Physician Note   I have taken an interval history, reviewed the chart and examined the patient. I agree with the Advanced Practitioner's note, impression and recommendations.   Major UGI bleed and ABL anemia felt to be a gastric source. Small gastric ulcer noted and possible gastric varix noted however source not clearly determined. Dieulafoy's lesion or AVM can be often be obscure or challenging to locate on EGD. No further GI evaluation planned for now unless rebleeding occurs. Outpatient EGD in 2 months as previously planned. Continue PPI and change to PO when appropriate. Outpatient GI follow up with Dr. Loletha Carrow in about 1 month. GI signing off and available if needed.   Lucio Edward, MD [name]

## 2019-03-07 NOTE — Progress Notes (Signed)
[facility] Progress Note Patient Name: Charles Gray DOB: 07-22-1955 MRN: [id number]   Date of Service  03/07/2019  HPI/Events of Note  Notified of generalized pain with patient looking uncomfortable and restless. He was given haloperidol earlier.  eICU Interventions   Ordered a one time dose of morphine 1 mg IV  Resume gabapentin once allowed PO intake  Correct Kphos     Intervention Category Major Interventions: Electrolyte abnormality - evaluation and management Intermediate Interventions: Pain - evaluation and management  Shona Needles [name] 03/07/2019, 2:18 AM

## 2019-03-07 NOTE — Procedures (Signed)
Cortrak  Person Inserting Tube:  Jacklynn Barnacle E, RD Tube Type:  Cortrak - 43 inches Tube Location:  Left nare Initial Placement:  Stomach Secured by: Bridle Technique Used to Measure Tube Placement:  Documented cm marking at nare/ corner of mouth Cortrak Secured At:  73 cm    Cortrak Tube Team Note:  Consult received to place a Cortrak feeding tube.   No x-ray is required. RN may begin using tube.   If the tube becomes dislodged please keep the tube and contact the Cortrak team at [email or web address] (password [email or web address]) for replacement.  If after hours and replacement cannot be delayed, place a NG tube and confirm placement with an abdominal x-ray.   Jacklynn Barnacle, MS, RD, LDN Office: [phone] Pager: [phone] After Hours/Weekend Pager: [phone]

## 2019-03-07 NOTE — Progress Notes (Signed)
NAME:  Charles Gray, MRN:  [id number], DOB:  14-Jul-1955, LOS: 3 ADMISSION DATE:  03/04/2019, CONSULTATION DATE:  03/07/19  CHIEF COMPLAINT:  GIB   Brief History   63 y.o. M with PMH of HTN, HL, Asthma and Cirrhosis, DDD who presented to [facility] on 03/02/19 for worsening dyspnea and leg edema and was treated with Lasix.  During his hospital course he had acutely dropping BP and Hgb with melanotic stool.  He also had increasing leukocytosis and lactic acidosis, but was afebrile and treated with Vanc, Aztreonam and Levaquin.   He had a CT abdomen/pelvis and abdominal CTA without obvious source of bleeding.  Hgb 14 on admission, dropped to 7.5.  He was given 3 units PRBC's and started on protonix and transfer requested to [facility].   Past Medical History   has a past medical history of Fatty liver disease, nonalcoholic, Hyperlipidemia (01/24/2019), Hypertension (01/24/2019), Insomnia (01/24/2019), and Lumbar disc herniation (01/24/2019).   Significant Hospital Events   03/02/19 Admitted to [facility] 03/04/19 Transfer to [facility] ICU  Consults:  Gastroenterology  Procedures:  Central Line placed at [place] ETT 11/22 >11/23  Significant Diagnostic Tests:  11/19 RUQ US>>hepatic steatosis, minimal portal vein flow, some degree of portal vein thrombosis not excluded however may be artifact 11/19 CXR>>no active cardiopulmonary disease 11/19 CTA Chest>>No PE, scattered mediastinal nodes without overt mass or adenopathy 11/21 CTA abd/pelvis>>no contrast accumulation, nodular hepatic contours 11/22 Echo >>> 11/23 endoscopy: gastric ulcer Micro Data:  11/19 Sars-CoV-2>>negative Oval [name]) 11/21>>BCx2   Antimicrobials:  Levaquin 11/21 only Aztreonam 11/21 only Vancomycin 11/21 only Rocephin 11/22- Flagyl 11/22-11/23  Interim history/subjective:  11/24: pt is awake but repetitive "oh, I'm doing pretty good", for any questions. Still encephalopathic.  11/23:  Decompensated yesterday requiring intubation. Had EGD done with no identified bleeding source. No acute events overnight.   Objective   Blood pressure 135/70, pulse (!) 112, temperature 99.2 F (37.3 C), temperature source Axillary, resp. rate 19, height 6' 10"  (2.083 m), weight (!) 184.1 kg, SpO2 99 %.    Vent Mode: CPAP;PSV FiO2 (%):  [40 %] 40 % PEEP:  [5 cmH20] 5 cmH20 Pressure Support:  [5 cmH20] 5 cmH20   Intake/Output Summary (Last 24 hours) at 03/07/2019 0842 Last data filed at 03/07/2019 0600 Gross per 24 hour  Intake 891.14 ml  Output 2260 ml  Net -1368.86 ml   Filed Weights   03/05/19 0500 03/06/19 0500 03/07/19 0500  Weight: (!) 188.8 kg (!) 188.9 kg (!) 184.1 kg    General:  Morbidly obese male, nad, laying supine in bed awake and and repetitive with conversation. Not following commands Neuro: as above HEENT:  [place]/AT, No JVD noted, PERRL. Cardiovascular:  RRR, no MRG Lungs:  Clear bilateral Abdomen:  OBESE, Soft, non-distended, non-tender Musculoskeletal:  No acute deformity Skin:  Intact, MMM   Resolved Hospital Problem list     Assessment & Plan:   Hypovolemic/hemorrhagic shock:.  - Telemetry monitoring - Trend HGB... stable - resolved  Gastrointestinal hemorrhage: Gastric source most likely per EGD, but no focus discovered.  - Continue protonix bid - GI following, requesting continued vent support to ensure no further bleeding.  - NPO. Speech eval pending Acute encephalopathY:  -ammonia -cth planned for today.  -cont to follow.   Acute hypoxemic respiratory failure Probable pneumonia, profound leukocytosis: improving - Continue Rocephin, stop flagyl - Follow radiological changes intermittently - Wean propofol to off given high triglycerides. Have increased fentanyl ceiling to facilitate this.  History of hypertension/hyperlipidemia -Follow echocardiogram -restart lisinopril once able to take po  Acute kidney injury -Likely related to  hypotension/shock -Trend electrolytes -Avoid nephrotoxic's -baseline ~1  Hepatic steatosis Likely related to [name] -No history of alcohol use -Trend LFTs  Chronic pain -Home Suboxone on hold in the context of shock. Add back ASAP to facilitate weaning.   Best practice:  Diet: N.p.o.awaiting speech eval.  Pain/Anxiety/Delirium protocol (if indicated):prn pain med VAP protocol (if indicated): Yes DVT prophylaxis: SCD's  GI prophylaxis: PPI BID Glucose control: SSI Mobility:BR Code Status: Full Family Communication: Son [name] updated 11/23 Disposition:  ICU  Labs   CBC: Recent Labs  Lab 03/04/19 2322 03/05/19 0600 03/05/19 1430 03/05/19 1620 03/05/19 2216 03/06/19 1622 03/07/19 0119  WBC 42.4* 32.3*  --  47.9* 45.0* 30.8* 27.2*  NEUTROABS 30.0*  --   --   --  37.8*  --   --   HGB 10.2* 8.8* 8.9* 8.6* 7.9* 7.9* 7.9*  HCT 31.6* 27.7* 26.6* 25.6* 24.6* 24.2* 24.6*  MCV 90.3 90.2  --  88.9 91.1 91.7 93.2  PLT 234 151  --  220 213 159 287    Basic Metabolic Panel: Recent Labs  Lab 03/04/19 2322 03/05/19 0052 03/05/19 2216 03/07/19 0119  NA 134* 135 138 143  K 3.7 3.8 3.6 3.4*  CL 96* 96* 104 109  CO2 24 25 24 26   GLUCOSE 178* 173* 179* 164*  BUN 97* 100* 100* 56*  CREATININE 1.52* 1.62* 1.52* 1.24  CALCIUM 8.4* 8.2* 7.8* 8.3*  MG 1.9  --   --  2.4  PHOS 2.1*  --   --  1.6*   GFR: Estimated Creatinine Clearance: 115.6 mL/min (by C-G formula based on SCr of 1.24 mg/dL). Recent Labs  Lab 03/04/19 2324  03/05/19 1620 03/05/19 2211 03/05/19 2216 03/06/19 1622 03/07/19 0119  WBC  --    < > 47.9*  --  45.0* 30.8* 27.2*  LATICACIDVEN 5.1*  --   --  3.2*  --   --   --    < > = values in this interval not displayed.    Liver Function Tests: Recent Labs  Lab 03/04/19 2322 03/07/19 0119  AST 41 96*  ALT 33 94*  ALKPHOS 48 51  BILITOT 0.7 0.6  PROT 5.5* 5.3*  ALBUMIN 2.9* 2.8*   Recent Labs  Lab 03/04/19 2329  LIPASE 48   No results for input(s):  AMMONIA in the last 168 hours.  ABG No results found for: PHART, PCO2ART, PO2ART, HCO3, TCO2, ACIDBASEDEF, O2SAT   Coagulation Profile: Recent Labs  Lab 03/04/19 2322 03/05/19 1430  INR 1.3* 1.3*    Cardiac Enzymes: No results for input(s): CKTOTAL, CKMB, CKMBINDEX, TROPONINI in the last 168 hours.  HbA1C: Hgb A1c MFr Bld  Date/Time Value Ref Range Status  03/06/2019 06:14 AM 5.3 4.8 - 5.6 % Final    Comment:    (NOTE) Pre diabetes:          5.7%-6.4% Diabetes:              >6.4% Glycemic control for   <7.0% adults with diabetes     CBG: Recent Labs  Lab 03/06/19 1533 03/06/19 1945 03/06/19 2335 03/07/19 0321 03/07/19 0800  GLUCAP 159* 155* 154* 148* 152*   CRITICAL CARE Performed by: Audria Nine  Critical care time: The patient is critically ill with multiple organ systems failure and requires high complexity decision making for assessment and support, frequent evaluation and titration  of therapies, application of advanced monitoring technologies and extensive interpretation of multiple databases.  Critical care time 32 mins. This represents my time independent of the NPs time taking care of the pt. This is excluding procedures.    Audria Nine DO After hours pager: [phone]  [facility] 03/07/2019, 8:42 AM

## 2019-03-07 NOTE — Progress Notes (Signed)
Speech Pathology:  Orders received for speech swallow eval. Pt preparing for transport for head CT, not ready for assessment.  Spoke with RN re: proceeding with [facility] swallow screen when pt is ready.  SLP will follow along - if he fails [facility], will proceed with SLP bedside swallow.  Clotiel Troop L. Tivis Ringer, [place] Office number [phone] Pager [phone]

## 2019-03-08 LAB — HEMOGLOBIN AND HEMATOCRIT, BLOOD
HCT: 21.5 % — ABNORMAL LOW (ref 39.0–52.0)
Hemoglobin: 6.7 g/dL — CL (ref 13.0–17.0)

## 2019-03-08 LAB — CBC
HCT: 27.1 % — ABNORMAL LOW (ref 39.0–52.0)
Hemoglobin: 8.5 g/dL — ABNORMAL LOW (ref 13.0–17.0)
MCH: 29.5 pg (ref 26.0–34.0)
MCHC: 31.4 g/dL (ref 30.0–36.0)
MCV: 94.1 fL (ref 80.0–100.0)
Platelets: 126 10*3/uL — ABNORMAL LOW (ref 150–400)
RBC: 2.88 MIL/uL — ABNORMAL LOW (ref 4.22–5.81)
RDW: 17.9 % — ABNORMAL HIGH (ref 11.5–15.5)
WBC: 13.4 10*3/uL — ABNORMAL HIGH (ref 4.0–10.5)
nRBC: 4.3 % — ABNORMAL HIGH (ref 0.0–0.2)

## 2019-03-08 LAB — PREPARE RBC (CROSSMATCH)

## 2019-03-08 LAB — GLUCOSE, CAPILLARY
Glucose-Capillary: 121 mg/dL — ABNORMAL HIGH (ref 70–99)
Glucose-Capillary: 119 mg/dL — ABNORMAL HIGH (ref 70–99)
Glucose-Capillary: 142 mg/dL — ABNORMAL HIGH (ref 70–99)
Glucose-Capillary: 104 mg/dL — ABNORMAL HIGH (ref 70–99)
Glucose-Capillary: 151 mg/dL — ABNORMAL HIGH (ref 70–99)

## 2019-03-08 LAB — MAGNESIUM: Magnesium: 2.6 mg/dL — ABNORMAL HIGH (ref 1.7–2.4)

## 2019-03-08 LAB — PHOSPHORUS: Phosphorus: 2.3 mg/dL — ABNORMAL LOW (ref 2.5–4.6)

## 2019-03-08 MED ORDER — SODIUM CHLORIDE 0.9 % IV SOLN
INTRAVENOUS | Status: DC | PRN
  Administered 2019-03-08: 18:00:00 via INTRAVENOUS

## 2019-03-08 MED ORDER — SODIUM CHLORIDE 0.9% IV SOLUTION
Freq: Once | INTRAVENOUS | Status: AC
  Administered 2019-03-08: 03:00:00 via INTRAVENOUS

## 2019-03-08 MED ORDER — POTASSIUM & SODIUM PHOSPHATES 280-160-250 MG PO PACK
1.0000 | PACK | Freq: Three times a day (TID) | ORAL | Status: AC
  Administered 2019-03-08 (×3): 1 via ORAL
  Filled 2019-03-08 (×3): qty 1

## 2019-03-08 MED ORDER — GABAPENTIN 250 MG/5ML PO SOLN
800.0000 mg | Freq: Three times a day (TID) | ORAL | Status: DC
  Administered 2019-03-09 – 2019-03-13 (×14): 800 mg via ORAL
  Filled 2019-03-08 (×19): qty 18

## 2019-03-08 MED ORDER — ALBUMIN HUMAN 5 % IV SOLN
12.5000 g | Freq: Once | INTRAVENOUS | Status: AC
  Administered 2019-03-08: 12.5 g via INTRAVENOUS
  Filled 2019-03-08: qty 250

## 2019-03-08 MED ORDER — PHENYLEPHRINE HCL-NACL 10-0.9 MG/250ML-% IV SOLN
0.0000 ug/min | INTRAVENOUS | Status: DC
  Administered 2019-03-08: 20 ug/min via INTRAVENOUS
  Filled 2019-03-08: qty 250

## 2019-03-08 NOTE — Progress Notes (Signed)
CRITICAL VALUE ALERT  Critical Value:  hgb 6.7  Date & Time Notified:  03/08/19 12:51 AM   Provider Notified: Suzanne Boron

## 2019-03-08 NOTE — Evaluation (Signed)
Physical Therapy Evaluation Patient Details Name: Charles Gray MRN: [id number] DOB: 25-Apr-1955 Today's Date: 03/08/2019   History of Present Illness  63 y.o. M with PMH of HTN, HL, Asthma and Cirrhosis, DDD who presented to [facility] on 03/02/19 for worsening dyspnea and leg edema and was treated with Lasix.  During his hospital course he had acutely dropping BP and Hgb with melanotic stool.  He also had increasing leukocytosis and lactic acidosis, but was afebrile and treated with Vanc, Aztreonam and Levaquin.   He had a CT abdomen/pelvis and abdominal CTA without obvious source of bleeding.  Hgb 14 on admission, dropped to 7.5.  He was given 3 units PRBC's and started on protonix and transfer requested to [facility].  Pt.has a past medical history of Fatty liver disease, nonalcoholic, Hyperlipidemia, Hypertension, Insomnia, and Lumbar disc herniation  Clinical Impression  Patient presents with decreased mobility due to deficits listed in PT problem list.  Currently needing min to mod A for OOB to chair.  Has flight of steps to enter his apartment, could stay with son.  Feel he should progress to be able to go home with family support and follow up HHPT.  PT to follow acutely.    Follow Up Recommendations Home health PT;Supervision/Assistance - 24 hour    Equipment Recommendations  Other (comment)(to be assessed)    Recommendations for Other Services       Precautions / Restrictions Precautions Precautions: Fall      Mobility  Bed Mobility Overal bed mobility: Needs Assistance Bed Mobility: Supine to Sit     Supine to sit: HOB elevated;Mod assist     General bed mobility comments: assist to guide legs off bed, pt pulled up to sit  Transfers Overall transfer level: Needs assistance Equipment used: None Transfers: Sit to/from Stand;Stand Pivot Transfers Sit to Stand: Min assist Stand pivot transfers: Min assist       General transfer comment: increased time to  stand, but minimal help for balance, safety, lines,  to chair pt with limited time on his feet used handles on chair to allow sitting safely, but had to move lines out from under him so he partially stood again unaided  Ambulation/Gait             General Gait Details: deferred due to weakness, dyspnea  Stairs            Wheelchair Mobility    Modified Rankin (Stroke Patients Only)       Balance Overall balance assessment: Needs assistance   Sitting balance-Leahy Scale: Good       Standing balance-Leahy Scale: Poor Standing balance comment: staying flexed while up on his feet and reaching for hand support on arm of recliner                             Pertinent Vitals/Pain Pain Assessment: No/denies pain    Home Living Family/patient expects to be discharged to:: Private residence Living Arrangements: Alone Available Help at Discharge: Family;Available PRN/intermittently Type of Home: Apartment Home Access: Stairs to enter Entrance Stairs-Rails: Chemical engineer of Steps: flight Home Layout: One level Home Equipment: None      Prior Function Level of Independence: Independent         Comments: so brought him groceries     Hand Dominance        Extremity/Trunk Assessment   Upper Extremity Assessment Upper Extremity Assessment: Generalized weakness  Lower Extremity Assessment Lower Extremity Assessment: Generalized weakness    Cervical / Trunk Assessment Cervical / Trunk Assessment: Kyphotic  Communication   Communication: No difficulties  Cognition Arousal/Alertness: Awake/alert Behavior During Therapy: WFL for tasks assessed/performed Overall Cognitive Status: Within Functional Limits for tasks assessed                                        General Comments General comments (skin integrity, edema, etc.): son in the room and offering support to help patient up to chair; reports he is  willing to assist pt temporarily at d/c.    Exercises     Assessment/Plan    PT Assessment Patient needs continued PT services  PT Problem List Decreased strength;Decreased activity tolerance;Decreased mobility;Decreased cognition;Cardiopulmonary status limiting activity;Decreased balance;Decreased knowledge of use of DME;Decreased knowledge of precautions       PT Treatment Interventions DME instruction;Therapeutic activities;Balance training;Gait training;Functional mobility training;Therapeutic exercise;Patient/family education    PT Goals (Current goals can be found in the Care Plan section)  Acute Rehab PT Goals Patient Stated Goal: to go home PT Goal Formulation: With patient/family Time For Goal Achievement: 03/22/19 Potential to Achieve Goals: Good    Frequency Min 3X/week   Barriers to discharge Decreased caregiver support lives alone, but could potentially have son assist    Co-evaluation               AM-PAC PT "6 Clicks" Mobility  Outcome Measure Help needed turning from your back to your side while in a flat bed without using bedrails?: A Little Help needed moving from lying on your back to sitting on the side of a flat bed without using bedrails?: A Little Help needed moving to and from a bed to a chair (including a wheelchair)?: A Little Help needed standing up from a chair using your arms (e.g., wheelchair or bedside chair)?: A Little Help needed to walk in hospital room?: A Lot Help needed climbing 3-5 steps with a railing? : Total 6 Click Score: 15    End of Session Equipment Utilized During Treatment: Oxygen Activity Tolerance: Patient tolerated treatment well Patient left: in chair;with call bell/phone within reach;with family/visitor present Nurse Communication: Mobility status PT Visit Diagnosis: Other abnormalities of gait and mobility (R26.89);Difficulty in walking, not elsewhere classified (R26.2);Muscle weakness (generalized) (M62.81)     Time: 0355-9741 PT Time Calculation (min) (ACUTE ONLY): 32 min   Charges:   PT Evaluation $PT Eval Moderate Complexity: 1 Mod PT Treatments $Therapeutic Activity: 8-22 mins        Magda Kiel, [place] [facility] [phone] 03/08/2019   Reginia Naas 03/08/2019, 3:59 PM

## 2019-03-08 NOTE — Progress Notes (Signed)
Passed bedside [facility] swallow screen. MD Mannam updated

## 2019-03-08 NOTE — Progress Notes (Signed)
PCCM to [facility] transfer:  Patient with h/o HTN; HLD: and [name] cirrhosis who presented on 11/20 from [facility] with hemorrhagic shock associated with upper GI bleeding, source not identified on 11/22 EGD.  He remained intubated in case of rebleeding.  He was extubated on 11/23.  His Hgb dropped a bit today, being transfused, likely equilibration.  He did have some post-extubation delirium.  No further bleeding, GI has signed off and needs outpatient f/u in 2-3 months.  He will need ongoing hemodynamic monitoring with transfusion as needed over the next 1-2 days to ensure ongoing stabilization prior to d/c.  [facility] will accept the patient as of 11/26.   Carlyon Shadow, M.D.

## 2019-03-08 NOTE — Progress Notes (Addendum)
NAME:  Charles Gray, MRN:  [id number], DOB:  [birth year], LOS: 4 ADMISSION DATE:  03/04/2019, CONSULTATION DATE:  03/08/19  CHIEF COMPLAINT:  GIB   Brief History   63 y.o. M with PMH of HTN, HL, Asthma and Cirrhosis, DDD who presented to [facility] on 03/02/19 for worsening dyspnea and leg edema and was treated with Lasix.  During his hospital course he had acutely dropping BP and Hgb with melanotic stool.  He also had increasing leukocytosis and lactic acidosis, but was afebrile and treated with Vanc, Aztreonam and Levaquin.   He had a CT abdomen/pelvis and abdominal CTA without obvious source of bleeding.  Hgb 14 on admission, dropped to 7.5.  He was given 3 units PRBC's and started on protonix and transfer requested to [facility].   Past Medical History   has a past medical history of Fatty liver disease, nonalcoholic, Hyperlipidemia (01/24/2019), Hypertension (01/24/2019), Insomnia (01/24/2019), and Lumbar disc herniation (01/24/2019).   Significant Hospital Events   03/02/19 Admitted to [facility] with dyspnea, leg edema  03/04/19 Transfer to [facility] ICU for evaluation of GI bleed  03/05/2019 Intubated  EGD.  Gastric source of bleeding of unclear locus.  MD spent greater than 2.5 hours clearing stomach of fresh blood and food but did not find active bleeding.  There was a chain of lesions on a fold in the distal gastric fundus with purple discoloration, 1 with a diminutive umbilicated spot (? change of deflated fundic varices?)  03/06/2019 EGD.  No esophageal or gastric varices.  Very mild portal hypertensive gastropathy.  Small, nonbleeding gastric ulcer with no bleeding stigmata in an area of localized erythema suspicious for NG tube trauma.  11/23 Self extubated  Consults:  Gastroenterology  Procedures:  Central Line placed at [facility]  ETT 11/22 >11/23  Significant Diagnostic Tests:  11/19 RUQ US>>hepatic steatosis, minimal portal vein flow, some degree of  portal vein thrombosis not excluded however may be artifact  11/19 CTA Chest>>No PE, scattered mediastinal nodes without overt mass or adenopathy  11/21 CTA abd/pelvis>>no contrast accumulation, nodular hepatic contours  11/22 Echo >> LVEF 65-70%, borderline LVH.  Normal RV size and function.  10/24 CT head >> normal head CT  Micro Data:  11/19 Sars-CoV-2>>negative Oval [name]) 11/21>>BCx2-negative  Antimicrobials:  Levaquin 11/21 only Aztreonam 11/21 only Vancomycin 11/21 only Flagyl 11/22-11/23  Rocephin 11/22>>  Interim history/subjective:   Improving mental status. Able to state name, date Requesting something to eat. Got 2 units of blood for hemoglobin 6.7 but no active ongoing bleed noted  Objective   Blood pressure (!) 114/57, pulse 62, temperature 98.3 F (36.8 C), temperature source Oral, resp. rate 15, height 6' 10"  (2.083 m), weight (!) 184.1 kg, SpO2 100 %.        Intake/Output Summary (Last 24 hours) at 03/08/2019 0917 Last data filed at 03/08/2019 0900 Gross per 24 hour  Intake 1990.96 ml  Output 1800 ml  Net 190.96 ml   Filed Weights   03/05/19 0500 03/06/19 0500 03/07/19 0500  Weight: (!) 188.8 kg (!) 188.9 kg (!) 184.1 kg   Gen:      No acute distress, obese HEENT:  EOMI, sclera anicteric Neck:     No masses; no thyromegaly Lungs:    Clear to auscultation bilaterally; normal respiratory effort CV:         Regular rate and rhythm; no murmurs Abd:      + bowel sounds; soft, non-tender; no palpable masses, no distension Ext:  No edema; adequate peripheral perfusion Skin:      Warm and dry; no rash Neuro: No focal deficits.  Awake, oriented.    Resolved Hospital Problem list     Assessment & Plan:   Hypovolemic/hemorrhagic shock:.  Hemodynamically stable Continue telemetry monitoring We will keep central line for 1 more day.  Can DC tomorrow if he continues to be stable.  Gastrointestinal hemorrhage: Gastric source most likely per EGD, but  no focus discovered.  Continue Protonix twice daily He will need a follow-up EGD in 2 months. GI signed off Bedside swallow eval today and will start clears if he passes.  Acute encephalopathy:  Improving.  CT head has been negative Continue supportive care  Acute hypoxemic respiratory failure Profound leukocytosis: improving Continue Rocephin for total 5 days   History of hypertension/hyperlipidemia Restart lisinopril once able to take po  Acute kidney injury > Improving Monitor creatinine  Hepatic steatosis Likely related to [name] -No history of alcohol use Follow LFTs  Chronic pain Restarted home Suboxone  Best practice:  Diet: N.p.o.awaiting swallow eval Pain/Anxiety/Delirium protocol (if indicated):prn pain med VAP protocol (if indicated): Yes DVT prophylaxis: SCD's  GI prophylaxis: PPI BID Glucose control: SSI Mobility:BR Code Status: Full Family Communication: Son [name] updated 11/25. Pt updated at bedside. Disposition: If repeat CBC looks okay then he can transfer to progressive care unit and [facility] service.  Labs   CBC: Recent Labs  Lab 03/04/19 2322 03/05/19 0600  03/05/19 1620 03/05/19 2216 03/06/19 1622 03/07/19 0119 03/08/19 0002  WBC 42.4* 32.3*  --  47.9* 45.0* 30.8* 27.2*  --   NEUTROABS 30.0*  --   --   --  37.8*  --   --   --   HGB 10.2* 8.8*   < > 8.6* 7.9* 7.9* 7.9* 6.7*  HCT 31.6* 27.7*   < > 25.6* 24.6* 24.2* 24.6* 21.5*  MCV 90.3 90.2  --  88.9 91.1 91.7 93.2  --   PLT 234 151  --  220 213 159 163  --    < > = values in this interval not displayed.    Basic Metabolic Panel: Recent Labs  Lab 03/04/19 2322 03/05/19 0052 03/05/19 2216 03/07/19 0119 03/08/19 0117  NA 134* 135 138 143  --   K 3.7 3.8 3.6 3.4*  --   CL 96* 96* 104 109  --   CO2 24 25 24 26   --   GLUCOSE 178* 173* 179* 164*  --   BUN 97* 100* 100* 56*  --   CREATININE 1.52* 1.62* 1.52* 1.24  --   CALCIUM 8.4* 8.2* 7.8* 8.3*  --   MG 1.9  --   --  2.4 2.6*  PHOS  2.1*  --   --  1.6* 2.3*   GFR: Estimated Creatinine Clearance: 115.6 mL/min (by C-G formula based on SCr of 1.24 mg/dL). Recent Labs  Lab 03/04/19 2324  03/05/19 1620 03/05/19 2211 03/05/19 2216 03/06/19 1622 03/07/19 0119  WBC  --    < > 47.9*  --  45.0* 30.8* 27.2*  LATICACIDVEN 5.1*  --   --  3.2*  --   --   --    < > = values in this interval not displayed.    Liver Function Tests: Recent Labs  Lab 03/04/19 2322 03/07/19 0119  AST 41 96*  ALT 33 94*  ALKPHOS 48 51  BILITOT 0.7 0.6  PROT 5.5* 5.3*  ALBUMIN 2.9* 2.8*   Recent Labs  Lab 03/04/19 2329  LIPASE 48   Recent Labs  Lab 03/07/19 1022  AMMONIA 31    ABG No results found for: PHART, PCO2ART, PO2ART, HCO3, TCO2, ACIDBASEDEF, O2SAT   Coagulation Profile: Recent Labs  Lab 03/04/19 2322 03/05/19 1430  INR 1.3* 1.3*    Cardiac Enzymes: No results for input(s): CKTOTAL, CKMB, CKMBINDEX, TROPONINI in the last 168 hours.  HbA1C: Hgb A1c MFr Bld  Date/Time Value Ref Range Status  03/06/2019 06:14 AM 5.3 4.8 - 5.6 % Final    Comment:    (NOTE) Pre diabetes:          5.7%-6.4% Diabetes:              >6.4% Glycemic control for   <7.0% adults with diabetes     CBG: Recent Labs  Lab 03/07/19 1520 03/07/19 2004 03/07/19 2325 03/08/19 0315 03/08/19 0737  GLUCAP 127* 118* 146* 121* 119*   Blease Capaldi MD [place] [facility] Pager [phone] If no answer call [phone] 03/08/2019, 9:28 AM

## 2019-03-08 NOTE — Progress Notes (Signed)
 Initial Nutrition Assessment  DOCUMENTATION CODES:   Morbid obesity  INTERVENTION:   Ensure Enlive po TID, each supplement provides 350 kcal and 20 grams of protein  If po intake remains adequate, recommend removal of Cortrak tube. Plan to hold on initiation of TF at this time as eating 100% of meals, supplements ordered, appetite good  NUTRITION DIAGNOSIS:   Increased nutrient needs related to acute illness as evidenced by estimated needs.  GOAL:   Patient will meet greater than or equal to 90% of their needs   MONITOR:   PO intake, Supplement acceptance, Labs, Weight trends  REASON FOR ASSESSMENT:   Consult Enteral/tube feeding initiation and management  ASSESSMENT:   63 yo male admitted with hypovolemic shock/hemorraghic shock with GI bleed, acute encephalopathy, acute respiratory failure. PMH HTN, HLD, [name] cirrhosis  11/19 Admit 11/20 Transfer to ICU 11/22 EGD- no clear gastric source 11/23 Extubated 11/24 Cortrak, TF initiated via Protocol  Diet advanced to Dysphagia III with Thin Liquids today per SLP. Pt ate 100% of lunch today, appetite good.   Cortrak tube in place but TF not currently infusing.   Labs: phosphorus 2.3 (L), mangesium 2.6 (H) Meds: phos nak, ss novolog  Diet Order:   Diet Order            DIET DYS 3 Room service appropriate? Yes; Fluid consistency: Thin  Diet effective now              EDUCATION NEEDS:   Not appropriate for education at this time  Skin:  Skin Assessment: Reviewed RN Assessment  Last BM:  11/25  Height:   Ht Readings from Last 1 Encounters:  03/04/19 6' 10"  (2.083 m)    Weight:   Wt Readings from Last 1 Encounters:  03/07/19 (!) 184.1 kg    Ideal Body Weight:  108 kg  BMI:  Body mass index is 42.44 kg/m.  Estimated Nutritional Needs:   Kcal:  2500-2700 kcals  Protein:  125-140 g  Fluid:  >/= 2 L    Yazmine Sorey MS, RDN, LDN, CNSC [phone] Pager  [phone] Weekend/On-Call  Pager

## 2019-03-08 NOTE — Progress Notes (Signed)
[place] Progress Note Patient Name: Charles Gray DOB: 1956-02-21 MRN: [id number]   Date of Service  03/08/2019  HPI/Events of Note  Hemoglobin 6.7 gm %  eICU Interventions  Transfuse 2 units of PRBC        Bryam Taborda U Judith Demps 03/08/2019, 1:00 AM

## 2019-03-08 NOTE — Progress Notes (Signed)
[place] Progress Note Patient Name: Charles Gray DOB: April 29, 1955 MRN: [id number]   Date of Service  03/08/2019  HPI/Events of Note  Hypotensio  eICU Interventions  Phenylephrine infusion PRN, check H & H, Albumin 5 % 250 ml iv x 1        Fidel Caggiano U Pride Gonzales 03/08/2019, 12:05 AM

## 2019-03-08 NOTE — Evaluation (Signed)
Clinical/Bedside Swallow Evaluation Patient Details  Name: Charles Gray MRN: [id number] Date of Birth: 02/06/1956  Today's Date: 03/08/2019 Time: SLP Start Time (ACUTE ONLY): 1100 SLP Stop Time (ACUTE ONLY): 1118 SLP Time Calculation (min) (ACUTE ONLY): 18 min  Past Medical History:  Past Medical History:  Diagnosis Date  . Fatty liver disease, nonalcoholic   . Hyperlipidemia 01/24/2019  . Hypertension 01/24/2019  . Insomnia 01/24/2019  . Lumbar disc herniation 01/24/2019   Past Surgical History:  Past Surgical History:  Procedure Laterality Date  . ESOPHAGOGASTRODUODENOSCOPY (EGD) WITH PROPOFOL N/A 03/05/2019   Procedure: ESOPHAGOGASTRODUODENOSCOPY (EGD) WITH PROPOFOL;  Surgeon: Doran Stabler, MD;  Location: [place];  Service: Gastroenterology;  Laterality: N/A;  Large Blood Clot Removed  . ESOPHAGOGASTRODUODENOSCOPY (EGD) WITH PROPOFOL N/A 03/06/2019   Procedure: ESOPHAGOGASTRODUODENOSCOPY (EGD) WITH PROPOFOL;  Surgeon: Thornton Park, MD;  Location: [place];  Service: Gastroenterology;  Laterality: N/A;  . HEMORROIDECTOMY     HPI:  Patient with h/o HTN; HLD: and [name] cirrhosis who presented on 11/20 from [facility] with hemorrhagic shock associated with upper GI bleeding, source not identified on 11/22 EGD.  He remained intubated in case of rebleeding.  He was extubated on 11/23.     Assessment / Plan / Recommendation Clinical Impression   Pt presents with no overt s/s of aspiration with solids or liquids during today's assessment.  Vocal quality was loud and clear and pt's volitional cough was strong.  Pt's oral phase is slightly prolonged when masticating solids due to missing dentition and acute deconditioning.  As a result, I would recommend that pt be initiated on a dys 3 (mechanical soft solids), thin liquids diet.  Meds may be administered whole with liquids or as tolerated by pt.  SLP will follow up for trials of advanced consistencies.  Suspect pt will be able  to be advanced quickly as his endurance picks up.    SLP Visit Diagnosis: Dysphagia, unspecified (R13.10)    Aspiration Risk  Mild aspiration risk    Diet Recommendation Dysphagia 3 (Mech soft);Thin liquid   Liquid Administration via: Cup;Straw Medication Administration: Whole meds with liquid Supervision: Patient able to self feed Compensations: Minimize environmental distractions;Slow rate;Small sips/bites Postural Changes: Seated upright at 90 degrees;Remain upright for at least 30 minutes after po intake    Other  Recommendations Oral Care Recommendations: Oral care BID   Follow up Recommendations None      Frequency and Duration min 1 x/week          Prognosis Prognosis for Safe Diet Advancement: Good      Swallow Study   General HPI: Patient with h/o HTN; HLD: and [name] cirrhosis who presented on 11/20 from [facility] with hemorrhagic shock associated with upper GI bleeding, source not identified on 11/22 EGD.  He remained intubated in case of rebleeding.  He was extubated on 11/23.   Type of Study: Bedside Swallow Evaluation Previous Swallow Assessment: none on record Diet Prior to this Study: Other (Comment)(clear liquids) Temperature Spikes Noted: No Respiratory Status: Nasal cannula History of Recent Intubation: Yes Length of Intubations (days): 3 days Date extubated: 03/06/19 Behavior/Cognition: Alert;Cooperative;Pleasant mood Oral Cavity Assessment: Dry Oral Cavity - Dentition: Edentulous Vision: Functional for self-feeding Self-Feeding Abilities: Able to feed self Patient Positioning: Upright in bed Baseline Vocal Quality: Normal Volitional Cough: Strong Volitional Swallow: Able to elicit    Oral/Motor/Sensory Function Overall Oral Motor/Sensory Function: Within functional limits   Ice Chips     Thin Liquid Thin Liquid: Within functional  limits Presentation: Straw    Nectar Thick     Honey Thick     Puree     Solid     Solid: Within functional  limits(prolonged mastication 2/2 missing dentition) Presentation: Self Fed      Charles Gray, Charles Gray L 03/08/2019,11:27 AM

## 2019-03-09 LAB — GLUCOSE, CAPILLARY
Glucose-Capillary: 114 mg/dL — ABNORMAL HIGH (ref 70–99)
Glucose-Capillary: 142 mg/dL — ABNORMAL HIGH (ref 70–99)
Glucose-Capillary: 144 mg/dL — ABNORMAL HIGH (ref 70–99)
Glucose-Capillary: 85 mg/dL (ref 70–99)
Glucose-Capillary: 96 mg/dL (ref 70–99)

## 2019-03-09 LAB — CBC
HCT: 24.7 % — ABNORMAL LOW (ref 39.0–52.0)
Hemoglobin: 7.8 g/dL — ABNORMAL LOW (ref 13.0–17.0)
MCH: 29.1 pg (ref 26.0–34.0)
MCHC: 31.6 g/dL (ref 30.0–36.0)
MCV: 92.2 fL (ref 80.0–100.0)
Platelets: 117 10*3/uL — ABNORMAL LOW (ref 150–400)
RBC: 2.68 MIL/uL — ABNORMAL LOW (ref 4.22–5.81)
RDW: 17.9 % — ABNORMAL HIGH (ref 11.5–15.5)
WBC: 11.7 10*3/uL — ABNORMAL HIGH (ref 4.0–10.5)
nRBC: 2.9 % — ABNORMAL HIGH (ref 0.0–0.2)

## 2019-03-09 LAB — BPAM RBC
Blood Product Expiration Date: 202012222359
Blood Product Expiration Date: 202012222359
ISSUE DATE / TIME: 202011250239
ISSUE DATE / TIME: 202011250506
Unit Type and Rh: 5100
Unit Type and Rh: 5100

## 2019-03-09 LAB — TYPE AND SCREEN
ABO/RH(D): O POS
Antibody Screen: NEGATIVE
Unit division: 0
Unit division: 0

## 2019-03-09 LAB — COMPREHENSIVE METABOLIC PANEL
ALT: 82 U/L — ABNORMAL HIGH (ref 0–44)
AST: 68 U/L — ABNORMAL HIGH (ref 15–41)
Albumin: 2.5 g/dL — ABNORMAL LOW (ref 3.5–5.0)
Alkaline Phosphatase: 56 U/L (ref 38–126)
Anion gap: 8 (ref 5–15)
BUN: 37 mg/dL — ABNORMAL HIGH (ref 8–23)
CO2: 27 mmol/L (ref 22–32)
Calcium: 7.7 mg/dL — ABNORMAL LOW (ref 8.9–10.3)
Chloride: 104 mmol/L (ref 98–111)
Creatinine, Ser: 1.01 mg/dL (ref 0.61–1.24)
GFR calc Af Amer: >60 mL/min (ref >60)
GFR calc non Af Amer: >60 mL/min (ref >60)
Glucose, Bld: 99 mg/dL (ref 70–99)
Potassium: 3.3 mmol/L — ABNORMAL LOW (ref 3.5–5.1)
Sodium: 139 mmol/L (ref 135–145)
Total Bilirubin: 0.9 mg/dL (ref 0.3–1.2)
Total Protein: 4.6 g/dL — ABNORMAL LOW (ref 6.5–8.1)

## 2019-03-09 LAB — PHOSPHORUS: Phosphorus: 4.2 mg/dL (ref 2.5–4.6)

## 2019-03-09 LAB — MAGNESIUM: Magnesium: 2.2 mg/dL (ref 1.7–2.4)

## 2019-03-09 MED ORDER — INSULIN ASPART 100 UNIT/ML ~~LOC~~ SOLN: 0.0000 [IU] | Freq: Every day | SUBCUTANEOUS | Status: DC

## 2019-03-09 MED ORDER — POTASSIUM CHLORIDE CRYS ER 20 MEQ PO TBCR
40.0000 meq | EXTENDED_RELEASE_TABLET | Freq: Once | ORAL | Status: DC
  Filled 2019-03-09: qty 2

## 2019-03-09 MED ORDER — PHENOL 1.4 % MT LIQD
1.0000 | OROMUCOSAL | Status: DC | PRN
  Administered 2019-03-09: 1 via OROMUCOSAL
  Filled 2019-03-09 (×2): qty 177

## 2019-03-09 MED ORDER — INSULIN ASPART 100 UNIT/ML ~~LOC~~ SOLN
0.0000 [IU] | Freq: Three times a day (TID) | SUBCUTANEOUS | Status: DC
  Administered 2019-03-09 – 2019-03-12 (×2): 2 [IU] via SUBCUTANEOUS

## 2019-03-09 MED ORDER — SENNOSIDES-DOCUSATE SODIUM 8.6-50 MG PO TABS
1.0000 | ORAL_TABLET | Freq: Two times a day (BID) | ORAL | Status: DC
  Administered 2019-03-09 – 2019-03-12 (×8): 1 via ORAL
  Filled 2019-03-09 (×9): qty 1

## 2019-03-09 MED ORDER — POTASSIUM CHLORIDE 20 MEQ/15ML (10%) PO SOLN
40.0000 meq | Freq: Once | ORAL | Status: AC
  Administered 2019-03-09: 40 meq via ORAL
  Filled 2019-03-09: qty 30

## 2019-03-09 MED ORDER — POLYETHYLENE GLYCOL 3350 17 G PO PACK
17.0000 g | PACK | Freq: Every day | ORAL | Status: DC | PRN
  Administered 2019-03-09 – 2019-03-12 (×4): 17 g via ORAL
  Filled 2019-03-09 (×4): qty 1

## 2019-03-09 MED ORDER — POTASSIUM CHLORIDE 10 MEQ/50ML IV SOLN
10.0000 meq | INTRAVENOUS | Status: AC
  Administered 2019-03-09 (×2): 10 meq via INTRAVENOUS
  Filled 2019-03-09 (×2): qty 50

## 2019-03-09 MED ORDER — ACETAMINOPHEN 325 MG PO TABS
650.0000 mg | ORAL_TABLET | ORAL | Status: DC | PRN
  Administered 2019-03-09 – 2019-03-13 (×9): 650 mg via ORAL
  Filled 2019-03-09 (×9): qty 2

## 2019-03-09 NOTE — Progress Notes (Signed)
eLink Physician-Brief Progress Note Patient Name: Emmanuelle Coxe DOB: [birth year] MRN: [id number]   Date of Service  03/09/2019  HPI/Events of Note  Pt is now eating. RN requests change of coverage insulin to [facility] and HS.  eICU Interventions  Coverage insulin changed to AC and HS.        Kerry Kass Alanny Rivers 03/09/2019, 12:03 AM

## 2019-03-09 NOTE — Plan of Care (Signed)
  Problem: Bowel/Gastric: Goal: Will show no signs and symptoms of gastrointestinal bleeding Outcome: Progressing   Problem: Fluid Volume: Goal: Will show no signs and symptoms of excessive bleeding Outcome: Progressing   Problem: Clinical Measurements: Goal: Complications related to the disease process, condition or treatment will be avoided or minimized Outcome: Progressing   Problem: Education: Goal: Knowledge of General Education information will improve Description: Including pain rating scale, medication(s)/side effects and non-pharmacologic comfort measures Outcome: Progressing   Problem: Health Behavior/Discharge Planning: Goal: Ability to manage health-related needs will improve Outcome: Progressing   Problem: Clinical Measurements: Goal: Ability to maintain clinical measurements within normal limits will improve Outcome: Progressing Goal: Will remain free from infection Outcome: Progressing Goal: Diagnostic test results will improve Outcome: Progressing Goal: Respiratory complications will improve Outcome: Progressing   Problem: Activity: Goal: Risk for activity intolerance will decrease Outcome: Progressing   Problem: Nutrition: Goal: Adequate nutrition will be maintained Outcome: Progressing   Problem: Coping: Goal: Level of anxiety will decrease Outcome: Progressing   Problem: Elimination: Goal: Will not experience complications related to urinary retention Outcome: Progressing   Problem: Safety: Goal: Ability to remain free from injury will improve Outcome: Progressing   Problem: Skin Integrity: Goal: Risk for impaired skin integrity will decrease Outcome: Progressing

## 2019-03-09 NOTE — Progress Notes (Signed)
Patient ID: Charles Gray, male   DOB: [birth year], 63 y.o.   MRN: [id number]  PROGRESS NOTE    Charles Gray  HBZ:[id number] DOB: 01-26-1956 DOA: 03/04/2019 PCP: Enid Skeens., MD   Brief Narrative:  63 year old male with history of hypertension, hyperlipidemia, asthma, cirrhosis, degenerative disc disease presented to [facility] on 03/02/2019 for worsening dyspnea and leg edema and was treated with Lasix.  During the hospitalization, he became hypotensive with drop in hemoglobin and melanotic stool along with increasing leukocytosis and lactic acidosis.  He was started on vancomycin, aztreonam and Levaquin.  He had a CT abdomen/pelvis and abdominal CTA without obvious source of bleeding.  Hemoglobin 14 on admission, dropped to 7.5.  He was given 3 units PRBCs and started on Protonix and transferred to [facility] under PCCM service on 03/04/2019.  He was intubated on 03/05/2019 and had EGD on the same day which showed gastric source of bleeding but unclear locus.  He underwent EGD again on 03/06/2019 which showed no esophageal or gastric varices; very mild portal hypertensive gastropathy; small, nonbleeding gastric ulcer with no bleeding stigmata in an area of localized erythema suspicious for NG tube trauma.  He self extubated on 03/06/2019.  GI signed off on 03/07/2019 and recommended repeat EGD in 2 months and to continue twice a day Protonix.  He was transferred to [facility] service on 03/09/2019.  Assessment & Plan:   Hypovolemic/hemorrhagic shock Acute blood loss anemia -Status post 3 units of transfusion of packed red cells prior to transfer to [facility].  Hemoglobin 7.8 today.  No further episodes of melena.  Hemodynamically stable.  DC central line.  Most likely upper GI bleeding -Had EGD on 03/05/2019 and on 03/06/2019.  Small gastric ulcer noted however source was not clearly determined.  No bleeding subsequently.  Hemoglobin is stable.  GI has signed off and  recommend EGD in 2 months.  We will switch Protonix to oral twice a day once able to start swallowing.  Acute metabolic encephalopathy -Improving.  CT of the head was negative for acute abnormality. -Fall precautions.  Monitor mental status  Acute hypoxic respiratory failure -Was intubated prior to EGD.  Self extubated on 03/06/2019. -Currently on room air. -Incentive spirometry -Today is day #6 of Rocephin.  Will DC Rocephin as PCCM had recommended 5 days of Rocephin  Leukocytosis -Much improved.  Monitor  Thrombocytopenia -Questionable cause.  Monitor  Acute kidney injury -Resolved  Hypokalemia -Replace.  Repeat a.m. labs  Hepatic steatosis Reported history of cirrhosis and fatty liver -Outpatient follow-up with GI.  Will not start on diuretics.  Hypertension -Continue lisinopril.  Monitor blood pressure.  Hyperlipidemia -Continue statin  Chronic pain -Home Suboxone has been restarted.  Continue gabapentin.  Outpatient follow-up with pain management  Generalized deconditioning -PT recommends home health PT.  Will request PT to work with him again today.  Morbid obesity -Outpatient follow-up   DVT prophylaxis: SCDs Code Status: Full Family Communication: Spoke to patient at bedside Disposition Plan: Home in 1 to 2 days if remains clinically stable.  Consultants: GI/PCCM  Procedures:  Intubation: 03/05/2019-03/06/2019 03/05/2019  EGD.Gastric source of bleeding of unclear locus. MD spent greater than 2.5hours clearing stomach of fresh blood and food but did not find active bleeding. There was a chain of lesions on a fold in the distal gastric fundus with purple discoloration, 1 with a diminutive umbilicated spot (?change of deflated fundic varices?)  03/06/2019 EGD.No esophageal or gastric varices. Very mild portal hypertensive gastropathy.  Small, nonbleeding gastric ulcer with no bleeding stigmata in an area of localized erythema suspiciousfor NG  tube trauma.  Antimicrobials:  Anti-infectives (From admission, onward)   Start     Dose/Rate Route Frequency Ordered Stop   03/05/19 0030  cefTRIAXone (ROCEPHIN) 2 g in sodium chloride 0.9 % 100 mL IVPB     2 g 200 mL/hr over 30 Minutes Intravenous Daily at bedtime 03/05/19 0001 03/09/19 2359   03/05/19 0030  metroNIDAZOLE (FLAGYL) IVPB 500 mg  Status:  Discontinued     500 mg 100 mL/hr over 60 Minutes Intravenous Every 8 hours 03/05/19 0001 03/07/19 0846       Subjective: Patient seen and examined at bedside.  Poor historian.  Slow to respond.  Denies any overnight fever, black or bloody stools.  Objective: Vitals:   03/09/19 0700 03/09/19 0800 03/09/19 0900 03/09/19 1003  BP: (!) 113/48 (!) 86/40 103/62 96/60  Pulse: 68 67 86 70  Resp: 13 13 18 14   Temp:  99.4 F (37.4 C)    TempSrc:  Oral    SpO2: 97% 99% 100% 96%  Weight:      Height:        Intake/Output Summary (Last 24 hours) at 03/09/2019 1029 Last data filed at 03/09/2019 1000 Gross per 24 hour  Intake 2386.31 ml  Output 1675 ml  Net 711.31 ml   Filed Weights   03/05/19 0500 03/06/19 0500 03/07/19 0500  Weight: (!) 188.8 kg (!) 188.9 kg (!) 184.1 kg    Examination:  General exam: Appears calm and comfortable.  Looks older than stated age.  Looks chronically ill.  Poor historian. Respiratory system: Bilateral decreased breath sounds at bases with some scattered crackles Cardiovascular system: S1 & S2 heard, Rate controlled Gastrointestinal system: Abdomen is morbidly obese, nondistended, soft and nontender. Normal bowel sounds heard. Extremities: No cyanosis, clubbing; trace edema  Central nervous system: Alert and oriented, poor historian. No focal neurological deficits. Moving extremities Skin: No rashes, lesions or ulcers Psychiatry: Flat affect    Data Reviewed: I have personally reviewed following labs and imaging studies  CBC: Recent Labs  Lab 03/04/19 2322  03/05/19 2216 03/06/19 1622  03/07/19 0119 03/08/19 0002 03/08/19 0949 03/09/19 0252  WBC 42.4*   < > 45.0* 30.8* 27.2*  --  13.4* 11.7*  NEUTROABS 30.0*  --  37.8*  --   --   --   --   --   HGB 10.2*   < > 7.9* 7.9* 7.9* 6.7* 8.5* 7.8*  HCT 31.6*   < > 24.6* 24.2* 24.6* 21.5* 27.1* 24.7*  MCV 90.3   < > 91.1 91.7 93.2  --  94.1 92.2  PLT 234   < > 213 159 163  --  126* 117*   < > = values in this interval not displayed.   Basic Metabolic Panel: Recent Labs  Lab 03/04/19 2322 03/05/19 0052 03/05/19 2216 03/07/19 0119 03/08/19 0117 03/09/19 0252  NA 134* 135 138 143  --  139  K 3.7 3.8 3.6 3.4*  --  3.3*  CL 96* 96* 104 109  --  104  CO2 24 25 24 26   --  27  GLUCOSE 178* 173* 179* 164*  --  99  BUN 97* 100* 100* 56*  --  37*  CREATININE 1.52* 1.62* 1.52* 1.24  --  1.01  CALCIUM 8.4* 8.2* 7.8* 8.3*  --  7.7*  MG 1.9  --   --  2.4 2.6* 2.2  PHOS 2.1*  --   --  1.6* 2.3* 4.2   GFR: Estimated Creatinine Clearance: 141.9 mL/min (by C-G formula based on SCr of 1.01 mg/dL). Liver Function Tests: Recent Labs  Lab 03/04/19 2322 03/07/19 0119 03/09/19 0252  AST 41 96* 68*  ALT 33 94* 82*  ALKPHOS 48 51 56  BILITOT 0.7 0.6 0.9  PROT 5.5* 5.3* 4.6*  ALBUMIN 2.9* 2.8* 2.5*   Recent Labs  Lab 03/04/19 2329  LIPASE 48   Recent Labs  Lab 03/07/19 1022  AMMONIA 31   Coagulation Profile: Recent Labs  Lab 03/04/19 2322 03/05/19 1430  INR 1.3* 1.3*   Cardiac Enzymes: No results for input(s): CKTOTAL, CKMB, CKMBINDEX, TROPONINI in the last 168 hours. BNP (last 3 results) Recent Labs    01/24/19 1121 02/27/19 1106  PROBNP 52 37   HbA1C: No results for input(s): HGBA1C in the last 72 hours. CBG: Recent Labs  Lab 03/08/19 0737 03/08/19 1111 03/08/19 1542 03/08/19 1952 03/09/19 0803  GLUCAP 119* 142* 104* 151* 96   Lipid Profile: Recent Labs    03/07/19 0721  TRIG 161*   Thyroid Function Tests: No results for input(s): TSH, T4TOTAL, FREET4, T3FREE, THYROIDAB in the last 72  hours. Anemia Panel: No results for input(s): VITAMINB12, FOLATE, FERRITIN, TIBC, IRON, RETICCTPCT in the last 72 hours. Sepsis Labs: Recent Labs  Lab 03/04/19 2324 03/05/19 2211  LATICACIDVEN 5.1* 3.2*    Recent Results (from the past 240 hour(s))  MRSA PCR Screening     Status: None   Collection Time: 03/04/19 10:39 PM   Specimen: Nasal Mucosa; Nasopharyngeal  Result Value Ref Range Status   MRSA by PCR NEGATIVE NEGATIVE Final    Comment:        The GeneXpert MRSA Assay (FDA approved for NASAL specimens only), is one component of a comprehensive MRSA colonization surveillance program. It is not intended to diagnose MRSA infection nor to guide or monitor treatment for MRSA infections. Performed at [facility], [place] [address], [place], [place] [address]   Culture, blood (routine x 2)     Status: None (Preliminary result)   Collection Time: 03/04/19 11:45 PM   Specimen: BLOOD RIGHT HAND  Result Value Ref Range Status   Specimen Description BLOOD RIGHT HAND  Final   Special Requests   Final    BOTTLES DRAWN AEROBIC AND ANAEROBIC Blood Culture adequate volume   Culture   Final    NO GROWTH 3 DAYS Performed at [facility], [place] [address] [place], [place] [address]    Report Status PENDING  Incomplete  Culture, blood (routine x 2)     Status: None (Preliminary result)   Collection Time: 03/04/19 11:50 PM   Specimen: BLOOD RIGHT WRIST  Result Value Ref Range Status   Specimen Description BLOOD RIGHT WRIST  Final   Special Requests   Final    BOTTLES DRAWN AEROBIC AND ANAEROBIC Blood Culture results may not be optimal due to an inadequate volume of blood received in culture bottles   Culture   Final    NO GROWTH 3 DAYS Performed at [facility], [place] [address] [place], [place] [address]    Report Status PENDING  Incomplete         Radiology Studies: Ct Head Wo Contrast  Result Date: 03/07/2019 CLINICAL DATA:  Onset right sided headache  today. EXAM: CT HEAD WITHOUT CONTRAST TECHNIQUE: Contiguous axial images were obtained from the base of the skull  through the vertex without intravenous contrast. COMPARISON:  None. FINDINGS: Brain: No evidence of acute infarction, hemorrhage, hydrocephalus, extra-axial collection or mass lesion/mass effect. Vascular: No hyperdense vessel or unexpected calcification. Skull: Normal. Negative for fracture or focal lesion. Sinuses/Orbits: Mild mucosal thickening right maxillary sinus noted. Other: None. IMPRESSION: Normal head CT. Electronically Signed   By: Inge Rise M.D.   On: 03/07/2019 11:44        Scheduled Meds: . buprenorphine-naloxone  2 tablet Sublingual Daily  . chlorhexidine  15 mL Mouth Rinse BID  . Chlorhexidine Gluconate Cloth  6 each Topical Q0600  . feeding supplement (PRO-STAT SUGAR FREE 64)  30 mL Per Tube BID  . gabapentin  800 mg Oral Q8H  . insulin aspart  0-15 Units Subcutaneous TID WC  . insulin aspart  0-5 Units Subcutaneous QHS  . lisinopril  30 mg Per Tube Daily  . mouth rinse  15 mL Mouth Rinse q12n4p  . pantoprazole (PROTONIX) IV  40 mg Intravenous Q12H  . pravastatin  20 mg Oral Daily   Continuous Infusions: . sodium chloride 10 mL/hr at 03/09/19 1000  . cefTRIAXone (ROCEPHIN)  IV Stopped (03/08/19 2143)  . phenylephrine (NEO-SYNEPHRINE) Adult infusion Stopped (03/08/19 0503)          Aline August, MD [facility] 03/09/2019, 10:29 AM

## 2019-03-09 NOTE — Progress Notes (Signed)
[place] Progress Note Patient Name: Nasire Reali DOB: [birth year] MRN: [id number]   Date of Service  03/09/2019  HPI/Events of Note  K+ 3.3  eICU Interventions  KCL 10 meq iv hourly x 2 doses.        Kerry Kass Natonya Finstad 03/09/2019, 4:11 AM

## 2019-03-09 NOTE — Progress Notes (Signed)
Pt concerned about possibly being discharged tomorrow. Pt lives on the 2nd floor in his apartment building and has 13 stairs to climb to get to his apartment. Pt asking for increased PT assistance so that he will be able to walk up his after discharge. Pt normally walks with a can/walking stick for stability.

## 2019-03-10 LAB — CBC WITH DIFFERENTIAL/PLATELET
Abs Immature Granulocytes: 0.29 10*3/uL — ABNORMAL HIGH (ref 0.00–0.07)
Basophils Absolute: 0 10*3/uL (ref 0.0–0.1)
Basophils Relative: 0 %
Eosinophils Absolute: 0.3 10*3/uL (ref 0.0–0.5)
Eosinophils Relative: 2 %
HCT: 29 % — ABNORMAL LOW (ref 39.0–52.0)
Hemoglobin: 9.6 g/dL — ABNORMAL LOW (ref 13.0–17.0)
Immature Granulocytes: 2 %
Lymphocytes Relative: 11 %
Lymphs Abs: 1.5 10*3/uL (ref 0.7–4.0)
MCH: 29.3 pg (ref 26.0–34.0)
MCHC: 33.1 g/dL (ref 30.0–36.0)
MCV: 88.4 fL (ref 80.0–100.0)
Monocytes Absolute: 1.3 10*3/uL — ABNORMAL HIGH (ref 0.1–1.0)
Monocytes Relative: 10 %
Neutro Abs: 10 10*3/uL — ABNORMAL HIGH (ref 1.7–7.7)
Neutrophils Relative %: 75 %
Platelets: 107 10*3/uL — ABNORMAL LOW (ref 150–400)
RBC: 3.28 MIL/uL — ABNORMAL LOW (ref 4.22–5.81)
RDW: 17.2 % — ABNORMAL HIGH (ref 11.5–15.5)
WBC: 13.4 10*3/uL — ABNORMAL HIGH (ref 4.0–10.5)
nRBC: 0.4 % — ABNORMAL HIGH (ref 0.0–0.2)

## 2019-03-10 LAB — CULTURE, BLOOD (ROUTINE X 2)
Culture: NO GROWTH
Culture: NO GROWTH
Special Requests: ADEQUATE

## 2019-03-10 LAB — BASIC METABOLIC PANEL
Anion gap: 9 (ref 5–15)
BUN: 26 mg/dL — ABNORMAL HIGH (ref 8–23)
CO2: 23 mmol/L (ref 22–32)
Calcium: 8 mg/dL — ABNORMAL LOW (ref 8.9–10.3)
Chloride: 107 mmol/L (ref 98–111)
Creatinine, Ser: 0.89 mg/dL (ref 0.61–1.24)
GFR calc Af Amer: >60 mL/min (ref >60)
GFR calc non Af Amer: >60 mL/min (ref >60)
Glucose, Bld: 125 mg/dL — ABNORMAL HIGH (ref 70–99)
Potassium: 4.8 mmol/L (ref 3.5–5.1)
Sodium: 139 mmol/L (ref 135–145)

## 2019-03-10 LAB — GLUCOSE, CAPILLARY
Glucose-Capillary: 61 mg/dL — ABNORMAL LOW (ref 70–99)
Glucose-Capillary: 155 mg/dL — ABNORMAL HIGH (ref 70–99)
Glucose-Capillary: 92 mg/dL (ref 70–99)
Glucose-Capillary: 103 mg/dL — ABNORMAL HIGH (ref 70–99)
Glucose-Capillary: 92 mg/dL (ref 70–99)

## 2019-03-10 LAB — MAGNESIUM: Magnesium: 2.3 mg/dL (ref 1.7–2.4)

## 2019-03-10 MED ORDER — ZOLPIDEM TARTRATE 5 MG PO TABS
5.0000 mg | ORAL_TABLET | Freq: Once | ORAL | Status: AC
  Administered 2019-03-10: 5 mg via ORAL
  Filled 2019-03-10: qty 1

## 2019-03-10 MED ORDER — PANTOPRAZOLE SODIUM 40 MG PO TBEC
40.0000 mg | DELAYED_RELEASE_TABLET | Freq: Two times a day (BID) | ORAL | Status: DC
  Administered 2019-03-10 – 2019-03-13 (×7): 40 mg via ORAL
  Filled 2019-03-10 (×7): qty 1

## 2019-03-10 MED ORDER — ZOLPIDEM TARTRATE 5 MG PO TABS
5.0000 mg | ORAL_TABLET | Freq: Every evening | ORAL | Status: DC | PRN
  Administered 2019-03-10: 5 mg via ORAL
  Filled 2019-03-10: qty 1

## 2019-03-10 NOTE — Plan of Care (Signed)
  Problem: Education: Goal: Ability to identify signs and symptoms of gastrointestinal bleeding will improve Outcome: Progressing   Problem: Bowel/Gastric: Goal: Will show no signs and symptoms of gastrointestinal bleeding Outcome: Progressing   Problem: Fluid Volume: Goal: Will show no signs and symptoms of excessive bleeding Outcome: Progressing   Problem: Clinical Measurements: Goal: Complications related to the disease process, condition or treatment will be avoided or minimized Outcome: Progressing   Problem: Education: Goal: Knowledge of General Education information will improve Description: Including pain rating scale, medication(s)/side effects and non-pharmacologic comfort measures Outcome: Progressing   Problem: Health Behavior/Discharge Planning: Goal: Ability to manage health-related needs will improve Outcome: Progressing   Problem: Clinical Measurements: Goal: Ability to maintain clinical measurements within normal limits will improve Outcome: Progressing Goal: Will remain free from infection Outcome: Progressing Goal: Diagnostic test results will improve Outcome: Progressing Goal: Respiratory complications will improve Outcome: Progressing Goal: Cardiovascular complication will be avoided Outcome: Progressing   Problem: Activity: Goal: Risk for activity intolerance will decrease Outcome: Progressing   Problem: Nutrition: Goal: Adequate nutrition will be maintained Outcome: Progressing   Problem: Coping: Goal: Level of anxiety will decrease Outcome: Progressing   Problem: Elimination: Goal: Will not experience complications related to urinary retention Outcome: Progressing   Problem: Pain Managment: Goal: General experience of comfort will improve Outcome: Progressing   Problem: Safety: Goal: Ability to remain free from injury will improve Outcome: Progressing   Problem: Skin Integrity: Goal: Risk for impaired skin integrity will  decrease Outcome: Progressing

## 2019-03-10 NOTE — Progress Notes (Signed)
Rehab Admissions Coordinator Note:  Patient was screened by Michel Santee for appropriateness for an Inpatient Acute Rehab Consult.  At this time, we are recommending Inpatient Rehab consult.  I will place a rehab consult order so that we can better assess pt's candidacy.   Michel Santee 03/10/2019, 9:28 AM  I can be reached at [phone].

## 2019-03-10 NOTE — Progress Notes (Signed)
PROGRESS NOTE    Charles Gray    Code Status: Full Code  FMB:[id number] DOB: [birth year] DOA: 03/04/2019  PCP: Enid Skeens., MD    Hospital Summary  63 year old male with history of hypertension, hyperlipidemia, asthma, cirrhosis, degenerative disc disease presented to [facility] on 03/02/2019 for worsening dyspnea and leg edema and was treated with Lasix.  During the hospitalization, he became hypotensive with drop in hemoglobin and melanotic stool along with increasing leukocytosis and lactic acidosis.  He was started on vancomycin, aztreonam and Levaquin.  He had a CT abdomen/pelvis and abdominal CTA without obvious source of bleeding.  Hemoglobin 14 on admission, dropped to 7.5.  He was given 3 units PRBCs and started on Protonix and transferred to [facility] under PCCM service on 03/04/2019.  He was intubated on 03/05/2019 and had EGD on the same day which showed gastric source of bleeding but unclear locus.  He underwent EGD again on 03/06/2019 which showed no esophageal or gastric varices; very mild portal hypertensive gastropathy; small, nonbleeding gastric ulcer with no bleeding stigmata in an area of localized erythema suspicious for NG tube trauma.  He self extubated on 03/06/2019.  GI signed off on 03/07/2019 and recommended repeat EGD in 2 months and to continue twice a day Protonix.  He was transferred to [facility] service on 03/09/2019.  A & P   Active Problems:   GIB (gastrointestinal bleeding)   Shock (HCC)   Dyspnea   Acute respiratory failure with hypoxia (HCC)  Hypovolemic/hemorrhagic shock, shock resolved Acute blood loss anemia secondary to suspected upper GI bleed -Status post 3 units of transfusion of packed red cells prior to transfer to [facility].  Hemoglobin 9.6 today.  No further episodes of melena.  Hemodynamically stable.    Most likely upper GI bleeding -Had EGD on 03/05/2019 and on 03/06/2019.  Small gastric ulcer noted however  source was not clearly determined.  No bleeding subsequently.  Hemoglobin is stable.  GI has signed off and recommend EGD in 2 months.  -Continue Protonix oral twice a day   Acute metabolic encephalopathy of unknown etiology -Resolved.  CT of the head was negative for acute abnormality. -Fall precautions.  Monitor mental status  Acute hypoxic respiratory failure of unknown etiology -Was intubated prior to EGD.  Self extubated on 03/06/2019. -Currently on room air. -Incentive spirometry -Completed 6 days Rocephin  Leukocytosis Increased today, 11.7-13.4.    Afebrile and hemodynamically stable on room air.  Obvious source of infection -Continue to monitor  Thrombocytopenia, Questionable cause, reactive versus cirrhosis No signs of bleeding -No transfusion at this time, monitor  Acute kidney injury -Resolved  Hypokalemia -Resolved  Hepatic steatosis Reported history of cirrhosis and fatty liver -Outpatient follow-up with GI.  Will not start on diuretics.  Hypertension -Continue lisinopril.  Monitor blood pressure.  Hyperlipidemia -Continue statin  Chronic pain -Home Suboxone has been restarted.  Continue gabapentin.  Outpatient follow-up with pain management  Generalized deconditioning -Being evaluated for CIR  Morbid obesity -Outpatient follow-up  Insomnia on Ambien outpatient -Continue Ambien    DVT prophylaxis: SCD Diet: Dysphagia 3 Family Communication: No family at bedside Disposition Plan: Transfer to med telemetry floor today, being evaluated for CIR admission, currently medically stable  Consultants  GI PCCM  Procedures  Intubation: 03/05/2019-03/06/2019 03/05/2019 EGD.Gastric source of bleeding of unclear locus. MD spent greater than 2.5hours clearing stomach of fresh blood and food but did not find active bleeding. There was a chain of lesions on a  fold in the distal gastric fundus with purple discoloration, 1 with a diminutive  umbilicated spot (?change of deflated fundic varices?)  03/06/2019 EGD.No esophageal or gastric varices. Very mild portal hypertensive gastropathy. Small, nonbleeding gastric ulcer with no bleeding stigmata in an area of localized erythema suspiciousfor NG tube trauma.  Antibiotics   Anti-infectives (From admission, onward)   Start     Dose/Rate Route Frequency Ordered Stop   03/05/19 0030  cefTRIAXone (ROCEPHIN) 2 g in sodium chloride 0.9 % 100 mL IVPB     2 g 200 mL/hr over 30 Minutes Intravenous Daily at bedtime 03/05/19 0001 03/09/19 2322   03/05/19 0030  metroNIDAZOLE (FLAGYL) IVPB 500 mg  Status:  Discontinued     500 mg 100 mL/hr over 60 Minutes Intravenous Every 8 hours 03/05/19 0001 03/07/19 0846           Subjective   Patient seen and examined at bedside no acute distress resting comfortably on room air.  States he has had some trouble sleeping lately but has noticed he has not been getting his home Ambien either.  Admits to some deconditioning while ambulating.  Denies any acute complaints otherwise.  Objective   Vitals:   03/10/19 0600 03/10/19 0700 03/10/19 1000 03/10/19 1204  BP: (!) 144/114 (!) 149/91 113/60   Pulse: 83 93 96   Resp: 19 (!) 23 16   Temp:    98.3 F (36.8 C)  TempSrc:    Oral  SpO2: 97% 100% 99%   Weight:      Height:        Intake/Output Summary (Last 24 hours) at 03/10/2019 1430 Last data filed at 03/10/2019 1230 Gross per 24 hour  Intake 840.14 ml  Output 2150 ml  Net -1309.86 ml   Filed Weights   03/06/19 0500 03/07/19 0500 03/10/19 0500  Weight: (!) 188.9 kg (!) 184.1 kg (!) 191.8 kg    Examination:  Physical Exam Vitals signs and nursing note reviewed.  Constitutional:      General: He is not in acute distress.    Appearance: He is obese. He is not toxic-appearing.  HENT:     Head: Normocephalic and atraumatic.     Nose: Nose normal.     Mouth/Throat:     Mouth: Mucous membranes are moist.  Eyes:      Extraocular Movements: Extraocular movements intact.  Neck:     Musculoskeletal: Normal range of motion. No neck rigidity.  Cardiovascular:     Rate and Rhythm: Normal rate and regular rhythm.  Pulmonary:     Effort: Pulmonary effort is normal.     Breath sounds: Normal breath sounds.  Abdominal:     General: Abdomen is flat.     Palpations: Abdomen is soft.  Musculoskeletal: Normal range of motion.        General: No deformity.     Comments: 4/5 bilateral lower extremity muscle strength  Neurological:     General: No focal deficit present.     Mental Status: He is alert. Mental status is at baseline.     Comments: Bilateral lower extremity patellar reflexes intact  Psychiatric:        Mood and Affect: Mood normal.        Behavior: Behavior normal.     Data Reviewed: I have personally reviewed following labs and imaging studies  CBC: Recent Labs  Lab 03/04/19 2322  03/05/19 2216 03/06/19 1622 03/07/19 0119 03/08/19 0002 03/08/19 0949 03/09/19 0252 03/10/19 0756  WBC  42.4*   < > 45.0* 30.8* 27.2*  --  13.4* 11.7* 13.4*  NEUTROABS 30.0*  --  37.8*  --   --   --   --   --  10.0*  HGB 10.2*   < > 7.9* 7.9* 7.9* 6.7* 8.5* 7.8* 9.6*  HCT 31.6*   < > 24.6* 24.2* 24.6* 21.5* 27.1* 24.7* 29.0*  MCV 90.3   < > 91.1 91.7 93.2  --  94.1 92.2 88.4  PLT 234   < > 213 159 163  --  126* 117* 107*   < > = values in this interval not displayed.   Basic Metabolic Panel: Recent Labs  Lab 03/04/19 2322 03/05/19 0052 03/05/19 2216 03/07/19 0119 03/08/19 0117 03/09/19 0252 03/10/19 0756  NA 134* 135 138 143  --  139 139  K 3.7 3.8 3.6 3.4*  --  3.3* 4.8  CL 96* 96* 104 109  --  104 107  CO2 24 25 24 26   --  27 23  GLUCOSE 178* 173* 179* 164*  --  99 125*  BUN 97* 100* 100* 56*  --  37* 26*  CREATININE 1.52* 1.62* 1.52* 1.24  --  1.01 0.89  CALCIUM 8.4* 8.2* 7.8* 8.3*  --  7.7* 8.0*  MG 1.9  --   --  2.4 2.6* 2.2 2.3  PHOS 2.1*  --   --  1.6* 2.3* 4.2  --    GFR: Estimated  Creatinine Clearance: 164.7 mL/min (by C-G formula based on SCr of 0.89 mg/dL). Liver Function Tests: Recent Labs  Lab 03/04/19 2322 03/07/19 0119 03/09/19 0252  AST 41 96* 68*  ALT 33 94* 82*  ALKPHOS 48 51 56  BILITOT 0.7 0.6 0.9  PROT 5.5* 5.3* 4.6*  ALBUMIN 2.9* 2.8* 2.5*   Recent Labs  Lab 03/04/19 2329  LIPASE 48   Recent Labs  Lab 03/07/19 1022  AMMONIA 31   Coagulation Profile: Recent Labs  Lab 03/04/19 2322 03/05/19 1430  INR 1.3* 1.3*   Cardiac Enzymes: No results for input(s): CKTOTAL, CKMB, CKMBINDEX, TROPONINI in the last 168 hours. BNP (last 3 results) Recent Labs    01/24/19 1121 02/27/19 1106  PROBNP 52 37   HbA1C: No results for input(s): HGBA1C in the last 72 hours. CBG: Recent Labs  Lab 03/09/19 1523 03/09/19 2336 03/10/19 0809 03/10/19 0925 03/10/19 1201  GLUCAP 142* 144* 61* 155* 92   Lipid Profile: No results for input(s): CHOL, HDL, LDLCALC, TRIG, CHOLHDL, LDLDIRECT in the last 72 hours. Thyroid Function Tests: No results for input(s): TSH, T4TOTAL, FREET4, T3FREE, THYROIDAB in the last 72 hours. Anemia Panel: No results for input(s): VITAMINB12, FOLATE, FERRITIN, TIBC, IRON, RETICCTPCT in the last 72 hours. Sepsis Labs: Recent Labs  Lab 03/04/19 2324 03/05/19 2211  LATICACIDVEN 5.1* 3.2*    Recent Results (from the past 240 hour(s))  MRSA PCR Screening     Status: None   Collection Time: 03/04/19 10:39 PM   Specimen: Nasal Mucosa; Nasopharyngeal  Result Value Ref Range Status   MRSA by PCR NEGATIVE NEGATIVE Final    Comment:        The GeneXpert MRSA Assay (FDA approved for NASAL specimens only), is one component of a comprehensive MRSA colonization surveillance program. It is not intended to diagnose MRSA infection nor to guide or monitor treatment for MRSA infections. Performed at [facility], [place] [address] [place], [place] [address]   Culture, blood (routine x 2)  Status: None   Collection  Time: 03/04/19 11:45 PM   Specimen: BLOOD RIGHT HAND  Result Value Ref Range Status   Specimen Description BLOOD RIGHT HAND  Final   Special Requests   Final    BOTTLES DRAWN AEROBIC AND ANAEROBIC Blood Culture adequate volume   Culture   Final    NO GROWTH 5 DAYS Performed at [facility], [address], [place], [place] [address]    Report Status 03/10/2019 FINAL  Final  Culture, blood (routine x 2)     Status: None   Collection Time: 03/04/19 11:50 PM   Specimen: BLOOD RIGHT WRIST  Result Value Ref Range Status   Specimen Description BLOOD RIGHT WRIST  Final   Special Requests   Final    BOTTLES DRAWN AEROBIC AND ANAEROBIC Blood Culture results may not be optimal due to an inadequate volume of blood received in culture bottles   Culture   Final    NO GROWTH 5 DAYS Performed at [facility], [place] [address], [place], [place] [address]    Report Status 03/10/2019 FINAL  Final         Radiology Studies: No results found.      Scheduled Meds:  buprenorphine-naloxone  2 tablet Sublingual Daily   chlorhexidine  15 mL Mouth Rinse BID   Chlorhexidine Gluconate Cloth  6 each Topical Q0600   feeding supplement (PRO-STAT SUGAR FREE 64)  30 mL Per Tube BID   gabapentin  800 mg Oral Q8H   insulin aspart  0-15 Units Subcutaneous TID WC   insulin aspart  0-5 Units Subcutaneous QHS   lisinopril  30 mg Per Tube Daily   mouth rinse  15 mL Mouth Rinse q12n4p   pantoprazole  40 mg Oral BID   pravastatin  20 mg Oral Daily   senna-docusate  1 tablet Oral BID   Continuous Infusions:  sodium chloride Stopped (03/09/19 2252)     LOS: 6 days    Time spent: 25 minutes with over 50% of the time coordinating the patient's care    Harold Hedge, DO [facility] Pager [phone]  If 7PM-7AM, please contact night-coverage [email or web address] Password [email or web address] 03/10/2019, 2:30 PM

## 2019-03-10 NOTE — Progress Notes (Signed)
Physical Therapy Treatment Patient Details Name: Charles Gray MRN: [id number] DOB: [birth year] Today's Date: 03/10/2019    History of Present Illness 63 y.o. M with PMH of HTN, HL, Asthma and Cirrhosis, DDD who presented to [facility] on 03/02/19 for worsening dyspnea and leg edema and was treated with Lasix.  During his hospital course he had acutely dropping BP and Hgb with melanotic stool.  He also had increasing leukocytosis and lactic acidosis, but was afebrile and treated with Vanc, Aztreonam and Levaquin.   He had a CT abdomen/pelvis and abdominal CTA without obvious source of bleeding.  Hgb 14 on admission, dropped to 7.5.  He was given 3 units PRBC's and started on protonix and transfer requested to [facility].  Pt.has a past medical history of Fatty liver disease, nonalcoholic, Hyperlipidemia, Hypertension, Insomnia, and Lumbar disc herniation    PT Comments    Pt requiring significant assist for mobility. Pt lives alone in second story apt. Son can provide some assist. Pt motivated to work toward return home and more independence. Feel pt would be an excellent CIR candidate. Pt also today c/o numbness in BLE's which he states wasn't present prior to this admission.    Follow Up Recommendations  Supervision/Assistance - 24 hour;CIR     Equipment Recommendations  Rolling walker with 5" wheels    Recommendations for Other Services Rehab consult     Precautions / Restrictions Precautions Precautions: Fall Restrictions Weight Bearing Restrictions: No    Mobility  Bed Mobility               General bed mobility comments: Pt up in chair  Transfers Overall transfer level: Needs assistance Equipment used: Rolling walker (2 wheeled) Transfers: Sit to/from Stand Sit to Stand: +2 physical assistance;Mod assist         General transfer comment: Assist to bring hips up. Verbal cues for hand placement  Ambulation/Gait Ambulation/Gait assistance: Min assist;+2  physical assistance Gait Distance (Feet): 3 Feet Assistive device: Rolling walker (2 wheeled) Gait Pattern/deviations: Step-to pattern;Decreased step length - right;Decreased step length - left;Shuffle;Trunk flexed Gait velocity: decr Gait velocity interpretation: <1.31 ft/sec, indicative of household ambulator General Gait Details: Assist for balance and support. Verbal cues to stand more upright   Stairs             Wheelchair Mobility    Modified Rankin (Stroke Patients Only)       Balance Overall balance assessment: Needs assistance Sitting-balance support: No upper extremity supported Sitting balance-Leahy Scale: Fair     Standing balance support: Bilateral upper extremity supported Standing balance-Leahy Scale: Poor Standing balance comment: walker and +2 min assist for static standing                            Cognition Arousal/Alertness: Awake/alert Behavior During Therapy: WFL for tasks assessed/performed Overall Cognitive Status: Within Functional Limits for tasks assessed                                        Exercises      General Comments General comments (skin integrity, edema, etc.): HR 110's. SpO2 >97% on RA      Pertinent Vitals/Pain Pain Assessment: Faces Faces Pain Scale: Hurts little more Pain Location: low back Pain Descriptors / Indicators: Grimacing;Guarding    Home Living  Prior Function            PT Goals (current goals can now be found in the care plan section) Acute Rehab PT Goals Patient Stated Goal: to go home Progress towards PT goals: Progressing toward goals    Frequency    Min 3X/week      PT Plan Discharge plan needs to be updated    Co-evaluation PT/OT/SLP Co-Evaluation/Treatment: Yes Reason for Co-Treatment: For patient/therapist safety PT goals addressed during session: Mobility/safety with mobility        AM-PAC PT "6 Clicks" Mobility    Outcome Measure  Help needed turning from your back to your side while in a flat bed without using bedrails?: A Little Help needed moving from lying on your back to sitting on the side of a flat bed without using bedrails?: A Little Help needed moving to and from a bed to a chair (including a wheelchair)?: A Lot Help needed standing up from a chair using your arms (e.g., wheelchair or bedside chair)?: A Lot Help needed to walk in hospital room?: Total Help needed climbing 3-5 steps with a railing? : Total 6 Click Score: 12    End of Session Equipment Utilized During Treatment: Gait belt Activity Tolerance: Patient tolerated treatment well Patient left: in chair;with call bell/phone within reach Nurse Communication: Mobility status PT Visit Diagnosis: Other abnormalities of gait and mobility (R26.89);Difficulty in walking, not elsewhere classified (R26.2);Muscle weakness (generalized) (M62.81)     Time: 3845-3646 PT Time Calculation (min) (ACUTE ONLY): 24 min  Charges:  $Therapeutic Activity: 8-22 mins                     [place] Pager [phone] Office [place] 03/10/2019, 9:21 AM

## 2019-03-10 NOTE — Progress Notes (Signed)
Patient trasfered from 25M to [phone] via bed; alert and oriented x 4; no complaints of pain; IV saline locked in LFA; BLE swelling and discoloration noted otherwise skin is intact. Orient patient to room and unit; instructed how to use the call bell and  fall risk precautions. Will continue to monitor the patient.

## 2019-03-10 NOTE — Progress Notes (Signed)
  Speech Language Pathology Treatment: Dysphagia  Patient Details Name: Charles Gray MRN: [id number] DOB: 07-19-1955 Today's Date: 03/10/2019 Time: 0920-0929 SLP Time Calculation (min) (ACUTE ONLY): 9 min  Assessment / Plan / Recommendation Clinical Impression  Pt fatigued after PT/OT session but hopeful about his recovery.  He is tolerating current diet - dysphagia 2, thin liquids- with no further s/s of dysphagia.  He is protecting his airway with liquids, no concerns for aspiration; voice is strong s/p three day intubation.  He does not want diet advanced to regular solids given his absence of teeth; states he is unable to chew advanced solids.  Recommend continuing current diet per pt's preferences.  There are no further needs from SLP service - we will sign off.    HPI HPI: Patient with h/o HTN; HLD: and [name] cirrhosis who presented on 11/20 from [facility] with hemorrhagic shock associated with upper GI bleeding, source not identified on 11/22 EGD.  He remained intubated in case of rebleeding.  He was extubated on 11/23.        SLP Plan  All goals met       Recommendations  Diet recommendations: Dysphagia 3 (mechanical soft);Thin liquid Liquids provided via: Cup;Straw Medication Administration: Whole meds with liquid Supervision: Patient able to self feed                Oral Care Recommendations: Oral care BID Follow up Recommendations: None SLP Visit Diagnosis: Dysphagia, unspecified (R13.10) Plan: All goals met       GO              Nakeda Lebron L. Tivis Ringer, [place] Office number [phone] Pager [phone]   Juan Quam Laur[place] 03/10/2019, 9:29 AM

## 2019-03-10 NOTE — Evaluation (Signed)
Occupational Therapy Evaluation Patient Details Name: Charles Gray MRN: [id number] DOB: 10/10/1955 Today's Date: 03/10/2019    History of Present Illness 63 y.o. M with PMH of HTN, HL, Asthma and Cirrhosis, DDD who presented to [facility] on 03/02/19 for worsening dyspnea and leg edema and was treated with Lasix.  During his hospital course he had acutely dropping BP and Hgb with melanotic stool.  He also had increasing leukocytosis and lactic acidosis, but was afebrile and treated with Vanc, Aztreonam and Levaquin.   He had a CT abdomen/pelvis and abdominal CTA without obvious source of bleeding.  Hgb 14 on admission, dropped to 7.5.  He was given 3 units PRBC's and started on protonix and transfer requested to [facility].  Pt.has a past medical history of Fatty liver disease, nonalcoholic, Hyperlipidemia, Hypertension, Insomnia, and Lumbar disc herniation   Clinical Impression   This 63 yo male admitted with above presents to acute OT with increased pain, decreased mobility, decreased strength thus affecting his safety and independence with basic ADLs with his PLOF of being independent and living at home by himself. He will benefit from acute OT with follow up on CIR to get to a Mod I to independent level to return home alone.    Follow Up Recommendations  CIR;Other (comment)(feel could get to a Mod I to independent level with CIR)    Equipment Recommendations  3 in 1 bedside commode;Tub/shower bench       Precautions / Restrictions Precautions Precautions: Fall Restrictions Weight Bearing Restrictions: No      Mobility Bed Mobility               General bed mobility comments: Pt up in chair  Transfers Overall transfer level: Needs assistance Equipment used: Rolling walker (2 wheeled) Transfers: Sit to/from Stand Sit to Stand: +2 physical assistance;Mod assist         General transfer comment: Assist to bring hips up. Verbal cues for hand placement     Balance Overall balance assessment: Needs assistance Sitting-balance support: No upper extremity supported Sitting balance-Leahy Scale: Fair     Standing balance support: Bilateral upper extremity supported Standing balance-Leahy Scale: Poor Standing balance comment: walker and +2 min assist for static standing                           ADL either performed or assessed with clinical judgement   ADL Overall ADL's : Needs assistance/impaired Eating/Feeding: Independent;Sitting   Grooming: Set up;Sitting   Upper Body Bathing: Set up;Sitting   Lower Body Bathing: Moderate assistance Lower Body Bathing Details (indicate cue type and reason): Mod A +2 sit<>stand Upper Body Dressing : Set up;Sitting   Lower Body Dressing: Maximal assistance Lower Body Dressing Details (indicate cue type and reason): Mod A +2 sit<>stand Toilet Transfer: Moderate assistance;Ambulation;BSC;Requires wide/bariatric   Toileting- Clothing Manipulation and Hygiene: Total assistance Toileting - Clothing Manipulation Details (indicate cue type and reason): Mod A +2 sit<>stand             Vision Baseline Vision/History: No visual deficits              Pertinent Vitals/Pain Pain Assessment: 0-10 Faces Pain Scale: Hurts little more Pain Location: low back Pain Descriptors / Indicators: Grimacing;Guarding Pain Intervention(s): Monitored during session;Repositioned     Hand Dominance Right   Extremity/Trunk Assessment Upper Extremity Assessment Upper Extremity Assessment: Generalized weakness           Communication Communication  Communication: No difficulties   Cognition Arousal/Alertness: Awake/alert Behavior During Therapy: WFL for tasks assessed/performed Overall Cognitive Status: Within Functional Limits for tasks assessed                                     General Comments  HR 110's. SpO2 >97% on RA            Home Living Family/patient expects  to be discharged to:: Private residence Living Arrangements: Alone Available Help at Discharge: Family;Available PRN/intermittently Type of Home: Apartment Home Access: Stairs to enter Entrance Stairs-Number of Steps: flight Entrance Stairs-Rails: Left;Right Home Layout: One level     Bathroom Shower/Tub: Teacher, early years/pre: Standard     Home Equipment: None   Additional Comments: Does not want to go live with son      Prior Functioning/Environment Level of Independence: Independent        Comments: son brings him groceries        OT Problem List: Decreased strength;Decreased range of motion;Impaired balance (sitting and/or standing);Decreased activity tolerance;Decreased safety awareness;Obesity;Pain      OT Treatment/Interventions: Self-care/ADL training;DME and/or AE instruction;Patient/family education;Balance training    OT Goals(Current goals can be found in the care plan section) Acute Rehab OT Goals Patient Stated Goal: to be able to home and not to his sons house OT Goal Formulation: With patient Time For Goal Achievement: 03/24/19 Potential to Achieve Goals: Good  OT Frequency: Min 2X/week   Barriers to D/C: Decreased caregiver support          Co-evaluation PT/OT/SLP Co-Evaluation/Treatment: Yes Reason for Co-Treatment: For patient/therapist safety PT goals addressed during session: Mobility/safety with mobility OT goals addressed during session: ADL's and self-care;Strengthening/ROM      AM-PAC OT "6 Clicks" Daily Activity     Outcome Measure Help from another person eating meals?: None Help from another person taking care of personal grooming?: A Little Help from another person toileting, which includes using toliet, bedpan, or urinal?: A Lot Help from another person bathing (including washing, rinsing, drying)?: A Lot Help from another person to put on and taking off regular upper body clothing?: A Little Help from another  person to put on and taking off regular lower body clothing?: A Lot 6 Click Score: 16   End of Session Equipment Utilized During Treatment: Gait belt;Rolling walker Nurse Communication: Mobility status(requesting CIR consult)  Activity Tolerance: Patient tolerated treatment well;Other (comment)(does tire easily) Patient left: in chair;with call bell/phone within reach  OT Visit Diagnosis: Unsteadiness on feet (R26.81);Other abnormalities of gait and mobility (R26.89);Muscle weakness (generalized) (M62.81);Pain Pain - part of body: (low back and legs)                Time: 7048-8891 OT Time Calculation (min): 24 min Charges:  OT General Charges $OT Visit: 1 Visit OT Evaluation $OT Eval Moderate Complexity: [place], OTR/L Acute [employer] Pager [phone] Office [phone]    Almon Register 03/10/2019, 9:45 AM

## 2019-03-10 NOTE — TOC Initial Note (Signed)
Transition of Care [facility]) - Initial/Assessment Note    Patient Details  Name: Charles Gray MRN: [id number] Date of Birth: 1955-07-08  Transition of Care [facility]) CM/SW Contact:    Bartholomew Crews, RN Phone Number: [phone] 03/10/2019, 11:23 AM  Clinical Narrative:                 TOC following patient for transition needs. PTA home alone in second floor apartment. Has son who can assist with some needs. Patient gives permission for NCM to speak with son. Noted that therapy notes now recommending CIR.   Expected Discharge Plan: IP Rehab Facility Barriers to Discharge: Continued Medical Work up   Patient Goals and CMS Choice Patient states their goals for this hospitalization and ongoing recovery are:: need to be able to climb up stairs to apt CMS Medicare.gov Compare Post Acute Care list provided to:: Patient Choice offered to / list presented to : Parent  Expected Discharge Plan and Services Expected Discharge Plan: [place] In-house Referral: Clinical Social Work Discharge Planning Services: CM Consult Post Acute Care Choice: IP Rehab Living arrangements for the past 2 months: Apartment                 DME Arranged: N/A DME Agency: NA       HH Arranged: NA [place] Agency: NA        Prior Living Arrangements/Services Living arrangements for the past 2 months: Apartment Lives with:: Self Patient language and need for interpreter reviewed:: Yes        Need for Family Participation in Patient Care: Yes (Comment) Care giver support system in place?: Yes (comment)   Criminal Activity/Legal Involvement Pertinent to Current Situation/Hospitalization: No - Comment as needed  Activities of Daily Living      Permission Sought/Granted Permission sought to share information with : Family Supports Permission granted to share information with : Yes, Verbal Permission Granted  Share Information with NAME: [name]     Permission granted to share info w Relationship:  son     Emotional Assessment       Orientation: : Oriented to Self, Oriented to Place, Oriented to  Time, Oriented to Situation Alcohol / Substance Use: Not Applicable Psych Involvement: No (comment)  Admission diagnosis:  HYPERTENSION-NOS GI BLEED Patient Active Problem List   Diagnosis Date Noted  . Acute respiratory failure with hypoxia ([place])   . Shock ([place])   . Dyspnea   . GIB (gastrointestinal bleeding) 03/04/2019  . Hyperlipidemia 01/24/2019  . Hypertension 01/24/2019  . Lumbar disc herniation 01/24/2019  . Insomnia 01/24/2019   PCP:  Enid Skeens., MD Pharmacy:   [facility], [place] [address] Phone: [phone] Fax: [phone]     Social Determinants of Health (SDOH) Interventions    Readmission Risk Interventions No flowsheet data found.

## 2019-03-11 LAB — CBC
HCT: 27.1 % — ABNORMAL LOW (ref 39.0–52.0)
Hemoglobin: 8.5 g/dL — ABNORMAL LOW (ref 13.0–17.0)
MCH: 29 pg (ref 26.0–34.0)
MCHC: 31.4 g/dL (ref 30.0–36.0)
MCV: 92.5 fL (ref 80.0–100.0)
Platelets: 111 10*3/uL — ABNORMAL LOW (ref 150–400)
RBC: 2.93 MIL/uL — ABNORMAL LOW (ref 4.22–5.81)
RDW: 16.9 % — ABNORMAL HIGH (ref 11.5–15.5)
WBC: 12.9 10*3/uL — ABNORMAL HIGH (ref 4.0–10.5)
nRBC: 0.4 % — ABNORMAL HIGH (ref 0.0–0.2)

## 2019-03-11 LAB — BASIC METABOLIC PANEL
Anion gap: 8 (ref 5–15)
BUN: 20 mg/dL (ref 8–23)
CO2: 26 mmol/L (ref 22–32)
Calcium: 8.1 mg/dL — ABNORMAL LOW (ref 8.9–10.3)
Chloride: 105 mmol/L (ref 98–111)
Creatinine, Ser: 0.84 mg/dL (ref 0.61–1.24)
GFR calc Af Amer: >60 mL/min (ref >60)
GFR calc non Af Amer: >60 mL/min (ref >60)
Glucose, Bld: 98 mg/dL (ref 70–99)
Potassium: 4.2 mmol/L (ref 3.5–5.1)
Sodium: 139 mmol/L (ref 135–145)

## 2019-03-11 LAB — GLUCOSE, CAPILLARY
Glucose-Capillary: 118 mg/dL — ABNORMAL HIGH (ref 70–99)
Glucose-Capillary: 89 mg/dL (ref 70–99)
Glucose-Capillary: 101 mg/dL — ABNORMAL HIGH (ref 70–99)
Glucose-Capillary: 95 mg/dL (ref 70–99)

## 2019-03-11 MED ORDER — FUROSEMIDE 80 MG PO TABS
80.0000 mg | ORAL_TABLET | Freq: Every day | ORAL | Status: DC
  Administered 2019-03-11 – 2019-03-13 (×3): 80 mg via ORAL
  Filled 2019-03-11 (×3): qty 1

## 2019-03-11 MED ORDER — ZOLPIDEM TARTRATE 5 MG PO TABS
10.0000 mg | ORAL_TABLET | Freq: Every evening | ORAL | Status: DC | PRN
  Administered 2019-03-11 – 2019-03-12 (×2): 10 mg via ORAL
  Filled 2019-03-11 (×2): qty 2

## 2019-03-11 MED ORDER — FUROSEMIDE 40 MG PO TABS
40.0000 mg | ORAL_TABLET | Freq: Every day | ORAL | Status: DC
  Administered 2019-03-11 – 2019-03-12 (×2): 40 mg via ORAL
  Filled 2019-03-11 (×3): qty 1

## 2019-03-11 NOTE — Progress Notes (Signed)
PROGRESS NOTE    Charles Gray    Code Status: Full Code  NWG:[id number] DOB: November 01, 1955 DOA: 03/04/2019  PCP: Enid Skeens., MD    Hospital Summary  63 year old male with history of hypertension, hyperlipidemia, asthma, cirrhosis, degenerative disc disease presented to [facility] on 03/02/2019 for worsening dyspnea and leg edema and was treated with Lasix.  During the hospitalization, he became hypotensive with drop in hemoglobin and melanotic stool along with increasing leukocytosis and lactic acidosis.  He was started on vancomycin, aztreonam and Levaquin.  He had a CT abdomen/pelvis and abdominal CTA without obvious source of bleeding.  Hemoglobin 14 on admission, dropped to 7.5.  He was given 3 units PRBCs and started on Protonix and transferred to [facility] under PCCM service on 03/04/2019.  He was intubated on 03/05/2019 and had EGD on the same day which showed gastric source of bleeding but unclear locus.  He underwent EGD again on 03/06/2019 which showed no esophageal or gastric varices; very mild portal hypertensive gastropathy; small, nonbleeding gastric ulcer with no bleeding stigmata in an area of localized erythema suspicious for NG tube trauma.  He self extubated on 03/06/2019.  GI signed off on 03/07/2019 and recommended repeat EGD in 2 months and to continue twice a day Protonix.  He was transferred to [facility] service on 03/09/2019. Transferred to stepdown 11/27.  A & P   Active Problems:   GIB (gastrointestinal bleeding)   Shock (H[facility])   Dyspnea   Acute respiratory failure with hypoxia ([facility])  Hypovolemic/hemorrhagic shock, shock resolved Acute blood loss anemia secondary to suspected upper GI bleed Status post 3 units of transfusion of packed red cells prior to transfer to [place] today suspect remnant from prior bleed, hemoglobin down to 9.6> 8.5.  Hemodynamically stable -Repeat H&H this evening, transfuse if hemoglobin less  than 7.0 -If Hb continues to drop and patient continues to have melanotic stool we will get GI involved  GI bleeding, suspect upper source -Had EGD on 03/05/2019 and on 03/06/2019.  Small gastric ulcer noted however source was not clearly determined.  No bleeding subsequently.  Hemoglobin is stable.  GI has signed off and recommend EGD in 2 months.  -Continue Protonix oral twice a day    Acute metabolic encephalopathy of unknown etiology Resolved.  CT of the head was negative for acute abnormality. -Fall precautions.  Monitor mental status  Acute hypoxic respiratory failure of unknown etiology Was intubated prior to EGD.  Self extubated on 03/06/2019. Currently on room air. Incentive spirometry Completed 6 days Rocephin  Volume overload EF 65 to 70% 03/05/2019 -Home Lasix restarted   Bilateral thigh paresthesias Concern for possible lumbar stenosis, no imaging on file.  Intermittent.  Had recurrence of symptoms during bm (likely Valsalva) and when he sits upright -Consider MRI lumbar spine  Leukocytosis 11.7-13.4->12.9.    Afebrile and hemodynamically stable on room air.  No Obvious source of infection -Continue to monitor  Thrombocytopenia, reactive versus cirrhosis Improved, currently 111 -No transfusion at this time, monitor  Acute kidney injury -Resolved  Hypokalemia -Resolved  Hepatic steatosis Reported history of cirrhosis and fatty liver -Outpatient follow-up with GI.    Hypertension Stable -Continue lisinopril.  Monitor blood pressure.  Hyperlipidemia -Continue statin  Chronic pain -Home Suboxone has been restarted.  Continue gabapentin.  Outpatient follow-up with pain management  Generalized deconditioning -Being evaluated for CIR  Morbid obesity -Outpatient follow-up  Insomnia on Ambien outpatient -Continue Ambien  DVT prophylaxis: SCD Diet: Dysphagia 3 Family Communication: No family at bedside Disposition Plan: Medically  stable for discharge, discharge pending availability at [place] PCCM  Procedures  Intubation: 03/05/2019-03/06/2019 03/05/2019 EGD.Gastric source of bleeding of unclear locus. MD spent greater than 2.5hours clearing stomach of fresh blood and food but did not find active bleeding. There was a chain of lesions on a fold in the distal gastric fundus with purple discoloration, 1 with a diminutive umbilicated spot (?change of deflated fundic varices?)  03/06/2019 EGD.No esophageal or gastric varices. Very mild portal hypertensive gastropathy. Small, nonbleeding gastric ulcer with no bleeding stigmata in an area of localized erythema suspiciousfor NG tube trauma.  Antibiotics   Anti-infectives (From admission, onward)   Start     Dose/Rate Route Frequency Ordered Stop   03/05/19 0030  cefTRIAXone (ROCEPHIN) 2 g in sodium chloride 0.9 % 100 mL IVPB     2 g 200 mL/hr over 30 Minutes Intravenous Daily at bedtime 03/05/19 0001 03/09/19 2322   03/05/19 0030  metroNIDAZOLE (FLAGYL) IVPB 500 mg  Status:  Discontinued     500 mg 100 mL/hr over 60 Minutes Intravenous Every 8 hours 03/05/19 0001 03/07/19 0846           Subjective   Patient seen and examined at bedside no acute distress and resting comfortably.  No events overnight.  Tolerating diet.  States that he had a bowel movement today for the first time in days which was hard and black.  Complaining of bilateral thigh intermittent paresthesias/numbness without other red flags.  Occasionally with left lower quadrant burning sensation while trying to have a BM.  Denies any chest pain, shortness of breath, fever, nausea, vomiting, urinary complaints.  Otherwise ROS negative   Objective   Vitals:   03/11/19 0011 03/11/19 0107 03/11/19 0443 03/11/19 0653  BP: (!) 128/58 (!) 107/50 (!) 120/56   Pulse: 91  75   Resp: 16  18   Temp: 97.9 F (36.6 C)  97.6 F (36.4 C)   TempSrc: Oral  Oral   SpO2: 99%  99%    Weight:    (!) 191.2 kg  Height:        Intake/Output Summary (Last 24 hours) at 03/11/2019 1220 Last data filed at 03/11/2019 0900 Gross per 24 hour  Intake 600.14 ml  Output 1750 ml  Net -1149.86 ml   Filed Weights   03/07/19 0500 03/10/19 0500 03/11/19 0653  Weight: (!) 184.1 kg (!) 191.8 kg (!) 191.2 kg    Examination:  Physical Exam Vitals signs and nursing note reviewed.  Constitutional:      Appearance: He is obese.  HENT:     Head: Normocephalic and atraumatic.     Nose: Nose normal.     Mouth/Throat:     Mouth: Mucous membranes are moist.  Eyes:     Extraocular Movements: Extraocular movements intact.  Neck:     Musculoskeletal: Normal range of motion. No neck rigidity.  Cardiovascular:     Rate and Rhythm: Normal rate and regular rhythm.  Pulmonary:     Effort: Pulmonary effort is normal.     Breath sounds: Normal breath sounds.  Abdominal:     General: Abdomen is flat.     Palpations: Abdomen is soft.  Musculoskeletal: Normal range of motion.     Comments: 1+ bilateral lower extremity pitting edema  Neurological:     General: No focal deficit present.     Mental Status: He is  alert. Mental status is at baseline.     Comments: Patellar reflexes intact  Psychiatric:        Mood and Affect: Mood normal.        Behavior: Behavior normal.     Data Reviewed: I have personally reviewed following labs and imaging studies  CBC: Recent Labs  Lab 03/04/19 2322  03/05/19 2216  03/07/19 0119 03/08/19 0002 03/08/19 0949 03/09/19 0252 03/10/19 0756 03/11/19 0231  WBC 42.4*   < > 45.0*   < > 27.2*  --  13.4* 11.7* 13.4* 12.9*  NEUTROABS 30.0*  --  37.8*  --   --   --   --   --  10.0*  --   HGB 10.2*   < > 7.9*   < > 7.9* 6.7* 8.5* 7.8* 9.6* 8.5*  HCT 31.6*   < > 24.6*   < > 24.6* 21.5* 27.1* 24.7* 29.0* 27.1*  MCV 90.3   < > 91.1   < > 93.2  --  94.1 92.2 88.4 92.5  PLT 234   < > 213   < > 163  --  126* 117* 107* 111*   < > = values in this interval  not displayed.   Basic Metabolic Panel: Recent Labs  Lab 03/04/19 2322  03/05/19 2216 03/07/19 0119 03/08/19 0117 03/09/19 0252 03/10/19 0756 03/11/19 0231  NA 134*   < > 138 143  --  139 139 139  K 3.7   < > 3.6 3.4*  --  3.3* 4.8 4.2  CL 96*   < > 104 109  --  104 107 105  CO2 24   < > 24 26  --  27 23 26   GLUCOSE 178*   < > 179* 164*  --  99 125* 98  BUN 97*   < > 100* 56*  --  37* 26* 20  CREATININE 1.52*   < > 1.52* 1.24  --  1.01 0.89 0.84  CALCIUM 8.4*   < > 7.8* 8.3*  --  7.7* 8.0* 8.1*  MG 1.9  --   --  2.4 2.6* 2.2 2.3  --   PHOS 2.1*  --   --  1.6* 2.3* 4.2  --   --    < > = values in this interval not displayed.   GFR: Estimated Creatinine Clearance: 174.2 mL/min (by C-G formula based on SCr of 0.84 mg/dL). Liver Function Tests: Recent Labs  Lab 03/04/19 2322 03/07/19 0119 03/09/19 0252  AST 41 96* 68*  ALT 33 94* 82*  ALKPHOS 48 51 56  BILITOT 0.7 0.6 0.9  PROT 5.5* 5.3* 4.6*  ALBUMIN 2.9* 2.8* 2.5*   Recent Labs  Lab 03/04/19 2329  LIPASE 48   Recent Labs  Lab 03/07/19 1022  AMMONIA 31   Coagulation Profile: Recent Labs  Lab 03/04/19 2322 03/05/19 1430  INR 1.3* 1.3*   Cardiac Enzymes: No results for input(s): CKTOTAL, CKMB, CKMBINDEX, TROPONINI in the last 168 hours. BNP (last 3 results) Recent Labs    01/24/19 1121 02/27/19 1106  PROBNP 52 37   HbA1C: No results for input(s): HGBA1C in the last 72 hours. CBG: Recent Labs  Lab 03/10/19 0925 03/10/19 1201 03/10/19 1738 03/10/19 2114 03/11/19 0804  GLUCAP 155* 92 103* 92 118*   Lipid Profile: No results for input(s): CHOL, HDL, LDLCALC, TRIG, CHOLHDL, LDLDIRECT in the last 72 hours. Thyroid Function Tests: No results for input(s): TSH, T4TOTAL, FREET4, T3FREE, THYROIDAB in the  last 72 hours. Anemia Panel: No results for input(s): VITAMINB12, FOLATE, FERRITIN, TIBC, IRON, RETICCTPCT in the last 72 hours. Sepsis Labs: Recent Labs  Lab 03/04/19 2324 03/05/19 2211   LATICACIDVEN 5.1* 3.2*    Recent Results (from the past 240 hour(s))  MRSA PCR Screening     Status: None   Collection Time: 03/04/19 10:39 PM   Specimen: Nasal Mucosa; Nasopharyngeal  Result Value Ref Range Status   MRSA by PCR NEGATIVE NEGATIVE Final    Comment:        The GeneXpert MRSA Assay (FDA approved for NASAL specimens only), is one component of a comprehensive MRSA colonization surveillance program. It is not intended to diagnose MRSA infection nor to guide or monitor treatment for MRSA infections. Performed at [facility], [place] [address] [place], [place] [address]   Culture, blood (routine x 2)     Status: None   Collection Time: 03/04/19 11:45 PM   Specimen: BLOOD RIGHT HAND  Result Value Ref Range Status   Specimen Description BLOOD RIGHT HAND  Final   Special Requests   Final    BOTTLES DRAWN AEROBIC AND ANAEROBIC Blood Culture adequate volume   Culture   Final    NO GROWTH 5 DAYS Performed at [facility], [place] [address], [place], [place] [address]    Report Status 03/10/2019 FINAL  Final  Culture, blood (routine x 2)     Status: None   Collection Time: 03/04/19 11:50 PM   Specimen: BLOOD RIGHT WRIST  Result Value Ref Range Status   Specimen Description BLOOD RIGHT WRIST  Final   Special Requests   Final    BOTTLES DRAWN AEROBIC AND ANAEROBIC Blood Culture results may not be optimal due to an inadequate volume of blood received in culture bottles   Culture   Final    NO GROWTH 5 DAYS Performed at [facility], [place] [address], [place],  [address]    Report Status 03/10/2019 FINAL  Final         Radiology Studies: No results found.      Scheduled Meds: . buprenorphine-naloxone  2 tablet Sublingual Daily  . chlorhexidine  15 mL Mouth Rinse BID  . Chlorhexidine Gluconate Cloth  6 each Topical Q0600  . feeding supplement (PRO-STAT SUGAR FREE 64)  30 mL Per Tube BID  . furosemide  40 mg Oral q1800  . furosemide  80  mg Oral Daily  . gabapentin  800 mg Oral Q8H  . insulin aspart  0-15 Units Subcutaneous TID WC  . insulin aspart  0-5 Units Subcutaneous QHS  . lisinopril  30 mg Per Tube Daily  . mouth rinse  15 mL Mouth Rinse q12n4p  . pantoprazole  40 mg Oral BID  . pravastatin  20 mg Oral Daily  . senna-docusate  1 tablet Oral BID   Continuous Infusions: . sodium chloride Stopped (03/09/19 2252)     LOS: 7 days    Time spent: 25 minutes with over 50% of the time coordinating the patient's care    Harold Hedge, DO [facility] Pager [phone]  If 7PM-7AM, please contact night-coverage [email or web address] Password [email or web address] 03/11/2019, 12:20 PM

## 2019-03-11 NOTE — Progress Notes (Signed)
Physical Therapy Treatment Patient Details Name: Charles Gray MRN: [id number] DOB: Apr 20, 1955 Today's Date: 03/11/2019    History of Present Illness 63 y.o. M with PMH of HTN, HL, Asthma and Cirrhosis, DDD who presented to [facility] on 03/02/19 for worsening dyspnea and leg edema and was treated with Lasix.  During his hospital course he had acutely dropping BP and Hgb with melanotic stool.  He also had increasing leukocytosis and lactic acidosis, but was afebrile and treated with Vanc, Aztreonam and Levaquin.   He had a CT abdomen/pelvis and abdominal CTA without obvious source of bleeding.  Hgb 14 on admission, dropped to 7.5.  He was given 3 units PRBC's and started on protonix and transfer requested to [facility].  Pt.has a past medical history of Fatty liver disease, nonalcoholic, Hyperlipidemia, Hypertension, Insomnia, and Lumbar disc herniation    PT Comments    Pt reports he has been up and down multiple times to toilet.  He reports pain in B LEs but willing to participate.  He is mobilizing with min to moderate assistance.  Pt HR remans 93bpm -95bpm, O2 sats stable but noticeable DOE.  Pt performed seated exercises with good tolerance.  Plan remains appropriate for aggressive rehab at CIR to return home to apt with support from his son.     Follow Up Recommendations  Supervision/Assistance - 24 hour;CIR     Equipment Recommendations  Rolling walker with 5" wheels    Recommendations for Other Services       Precautions / Restrictions Precautions Precautions: Fall Restrictions Weight Bearing Restrictions: No    Mobility  Bed Mobility Overal bed mobility: Needs Assistance Bed Mobility: Supine to Sit;Sit to Supine     Supine to sit: HOB elevated;Mod assist     General bed mobility comments: Pt in long sitting and required moderate assistance to move LEs to edge of bed.  Use of chux pad to scoot B Hips forward.  Transfers Overall transfer level: Needs  assistance Equipment used: Rolling walker (2 wheeled) Transfers: Sit to/from Stand Sit to Stand: Min assist;From elevated surface         General transfer comment: Min from elevated surface and from Bed side commode.  Pt performed multiple standing trials.  Ambulation/Gait Ambulation/Gait assistance: Min assist;+2 safety/equipment Gait Distance (Feet): 16 Feet(+ 10 ft x2) Assistive device: Rolling walker (2 wheeled) Gait Pattern/deviations: Step-to pattern;Decreased step length - right;Decreased step length - left;Shuffle;Trunk flexed Gait velocity: decr   General Gait Details: Pt with slow shuffling pattern with poor foot clearance Bilat.  Pt required cues for increasing step length/height and improving upright posture.   Stairs             Wheelchair Mobility    Modified Rankin (Stroke Patients Only)       Balance Overall balance assessment: Needs assistance   Sitting balance-Leahy Scale: Fair       Standing balance-Leahy Scale: Poor                              Cognition Arousal/Alertness: Awake/alert Behavior During Therapy: WFL for tasks assessed/performed Overall Cognitive Status: Within Functional Limits for tasks assessed                                        Exercises General Exercises - Lower Extremity Ankle Circles/Pumps: AROM;Both;15 reps;Seated Short Arc  Quad: AROM;Both;10 reps;Seated Hip Flexion/Marching: AROM;Both;10 reps;Seated    General Comments        Pertinent Vitals/Pain Pain Assessment: 0-10 Faces Pain Scale: Hurts little more Pain Location: low back Pain Descriptors / Indicators: Grimacing;Guarding Pain Intervention(s): Monitored during session;Repositioned    Home Living                      Prior Function            PT Goals (current goals can now be found in the care plan section) Acute Rehab PT Goals Patient Stated Goal: to be able to home and not to his sons house Potential  to Achieve Goals: Good Progress towards PT goals: Progressing toward goals    Frequency    Min 3X/week      PT Plan Current plan remains appropriate    Co-evaluation              AM-PAC PT "6 Clicks" Mobility   Outcome Measure  Help needed turning from your back to your side while in a flat bed without using bedrails?: A Lot Help needed moving from lying on your back to sitting on the side of a flat bed without using bedrails?: A Lot Help needed moving to and from a bed to a chair (including a wheelchair)?: A Little Help needed standing up from a chair using your arms (e.g., wheelchair or bedside chair)?: A Little Help needed to walk in hospital room?: A Little Help needed climbing 3-5 steps with a railing? : A Lot 6 Click Score: 15    End of Session Equipment Utilized During Treatment: Gait belt Activity Tolerance: Patient tolerated treatment well Patient left: in chair;with call bell/phone within reach Nurse Communication: Mobility status PT Visit Diagnosis: Other abnormalities of gait and mobility (R26.89);Difficulty in walking, not elsewhere classified (R26.2);Muscle weakness (generalized) (M62.81)     Time: 7017-7939 PT Time Calculation (min) (ACUTE ONLY): 26 min  Charges:  $Gait Training: 8-22 mins $Therapeutic Exercise: 8-22 mins                     Erasmo Leventhal , PTA Acute Rehabilitation Services Pager [phone] Office [phone]     Charles Gray 03/11/2019, 2:58 PM

## 2019-03-11 NOTE — Plan of Care (Signed)
  Problem: Education: Goal: Ability to identify signs and symptoms of gastrointestinal bleeding will improve Outcome: Progressing   Problem: Bowel/Gastric: Goal: Will show no signs and symptoms of gastrointestinal bleeding Outcome: Progressing   Problem: Fluid Volume: Goal: Will show no signs and symptoms of excessive bleeding Outcome: Progressing   Problem: Clinical Measurements: Goal: Complications related to the disease process, condition or treatment will be avoided or minimized Outcome: Progressing   Problem: Education: Goal: Knowledge of General Education information will improve Description: Including pain rating scale, medication(s)/side effects and non-pharmacologic comfort measures Outcome: Progressing   Problem: Health Behavior/Discharge Planning: Goal: Ability to manage health-related needs will improve Outcome: Progressing   Problem: Clinical Measurements: Goal: Ability to maintain clinical measurements within normal limits will improve Outcome: Progressing Goal: Will remain free from infection Outcome: Progressing Goal: Diagnostic test results will improve Outcome: Progressing Goal: Respiratory complications will improve Outcome: Progressing Goal: Cardiovascular complication will be avoided Outcome: Progressing   Problem: Activity: Goal: Risk for activity intolerance will decrease Outcome: Progressing   Problem: Nutrition: Goal: Adequate nutrition will be maintained Outcome: Progressing   Problem: Coping: Goal: Level of anxiety will decrease Outcome: Progressing   Problem: Elimination: Goal: Will not experience complications related to bowel motility Outcome: Progressing Goal: Will not experience complications related to urinary retention Outcome: Progressing   Problem: Pain Managment: Goal: General experience of comfort will improve Outcome: Progressing   Problem: Safety: Goal: Ability to remain free from injury will improve Outcome:  Progressing   Problem: Skin Integrity: Goal: Risk for impaired skin integrity will decrease Outcome: Progressing

## 2019-03-12 DIAGNOSIS — M792 Neuralgia and neuritis, unspecified: Secondary | ICD-10-CM

## 2019-03-12 DIAGNOSIS — R262 Difficulty in walking, not elsewhere classified: Secondary | ICD-10-CM

## 2019-03-12 LAB — CBC
HCT: 27.1 % — ABNORMAL LOW (ref 39.0–52.0)
Hemoglobin: 8.7 g/dL — ABNORMAL LOW (ref 13.0–17.0)
MCH: 28.9 pg (ref 26.0–34.0)
MCHC: 32.1 g/dL (ref 30.0–36.0)
MCV: 90 fL (ref 80.0–100.0)
Platelets: 135 10*3/uL — ABNORMAL LOW (ref 150–400)
RBC: 3.01 MIL/uL — ABNORMAL LOW (ref 4.22–5.81)
RDW: 16.5 % — ABNORMAL HIGH (ref 11.5–15.5)
WBC: 13.1 10*3/uL — ABNORMAL HIGH (ref 4.0–10.5)
nRBC: 0 % (ref 0.0–0.2)

## 2019-03-12 LAB — GLUCOSE, CAPILLARY
Glucose-Capillary: 121 mg/dL — ABNORMAL HIGH (ref 70–99)
Glucose-Capillary: 104 mg/dL — ABNORMAL HIGH (ref 70–99)
Glucose-Capillary: 117 mg/dL — ABNORMAL HIGH (ref 70–99)
Glucose-Capillary: 121 mg/dL — ABNORMAL HIGH (ref 70–99)

## 2019-03-12 LAB — BASIC METABOLIC PANEL
Anion gap: 9 (ref 5–15)
BUN: 19 mg/dL (ref 8–23)
CO2: 26 mmol/L (ref 22–32)
Calcium: 8.1 mg/dL — ABNORMAL LOW (ref 8.9–10.3)
Chloride: 102 mmol/L (ref 98–111)
Creatinine, Ser: 0.81 mg/dL (ref 0.61–1.24)
GFR calc Af Amer: >60 mL/min (ref >60)
GFR calc non Af Amer: >60 mL/min (ref >60)
Glucose, Bld: 102 mg/dL — ABNORMAL HIGH (ref 70–99)
Potassium: 4.3 mmol/L (ref 3.5–5.1)
Sodium: 137 mmol/L (ref 135–145)

## 2019-03-12 MED ORDER — CYCLOBENZAPRINE HCL 10 MG PO TABS
10.0000 mg | ORAL_TABLET | Freq: Once | ORAL | Status: AC
  Administered 2019-03-12: 10 mg via ORAL
  Filled 2019-03-12: qty 1

## 2019-03-12 MED ORDER — METOLAZONE 5 MG PO TABS: 5.0000 mg | ORAL_TABLET | ORAL | Status: DC

## 2019-03-12 MED ORDER — PNEUMOCOCCAL VAC POLYVALENT 25 MCG/0.5ML IJ INJ
0.5000 mL | INJECTION | INTRAMUSCULAR | Status: DC
  Filled 2019-03-12: qty 0.5

## 2019-03-12 MED ORDER — DULOXETINE HCL 30 MG PO CPEP
30.0000 mg | ORAL_CAPSULE | Freq: Every day | ORAL | Status: DC
  Administered 2019-03-12 – 2019-03-13 (×2): 30 mg via ORAL
  Filled 2019-03-12 (×2): qty 1

## 2019-03-12 MED ORDER — METOLAZONE 5 MG PO TABS
5.0000 mg | ORAL_TABLET | Freq: Every day | ORAL | Status: DC
  Administered 2019-03-12: 5 mg via ORAL
  Filled 2019-03-12 (×2): qty 1

## 2019-03-12 NOTE — Progress Notes (Signed)
PROGRESS NOTE    Osiah Haring    Code Status: Full Code  YWV:[id number] DOB: 06-Aug-1955 DOA: 03/04/2019  PCP: Enid Skeens., MD    Hospital Summary  63 year old male with history of hypertension, hyperlipidemia, asthma, cirrhosis, degenerative disc disease presented to [facility] on 03/02/2019 for worsening dyspnea and leg edema and was treated with Lasix.  During the hospitalization, he became hypotensive with drop in hemoglobin and melanotic stool along with increasing leukocytosis and lactic acidosis.  He was started on vancomycin, aztreonam and Levaquin.  He had a CT abdomen/pelvis and abdominal CTA without obvious source of bleeding.  Hemoglobin 14 on admission, dropped to 7.5.  He was given 3 units PRBCs and started on Protonix and transferred to [facility] under PCCM service on 03/04/2019.  He was intubated on 03/05/2019 and had EGD on the same day which showed gastric source of bleeding but unclear locus.  He underwent EGD again on 03/06/2019 which showed no esophageal or gastric varices; very mild portal hypertensive gastropathy; small, nonbleeding gastric ulcer with no bleeding stigmata in an area of localized erythema suspicious for NG tube trauma.  He self extubated on 03/06/2019.  GI signed off on 03/07/2019 and recommended repeat EGD in 2 months and to continue twice a day Protonix.  He was transferred to [facility] service on 03/09/2019. Transferred to stepdown 11/27.  A & P   Active Problems:   GIB (gastrointestinal bleeding)   Shock (HCC)   Dyspnea   Acute respiratory failure with hypoxia (HCC)  Bilateral lower extremity paresthesias, likely from chronic herniated lumbar disc in setting of obesity No mri on file, patient reports long standing history of multiple lumbar herniated discs without surgical intervention or epidural injections in the past. Positive straight leg raise test.  -Consider MRI lumbar spine outpatient -consider epidural injection outpatient   -recommended PT outpatient and weight loss once he is discharged from CIR  -will start Cymbalta 30 mg x 1 week followed by 60 mg daily for depression and neuropathic pain  Hypovolemic/hemorrhagic shock, shock resolved Acute blood loss anemia secondary to suspected upper GI bleed Status post 3 units of transfusion of packed red cells prior to transfer to [facility].   Repeat Large dark BM today suspect persistent remnant from prior bleed and not rebleed, hemoglobin down to 9.6> 8.5>8.7.  Hemodynamically stable -Repeat H&H this evening, transfuse if hemoglobin less than 7.0 -If Hb continues to drop and patient continues to have melanotic stool we will get GI involved  GI bleeding, suspect upper source -Had EGD on 03/05/2019 and on 03/06/2019.  Small gastric ulcer noted however source was not clearly determined.  No bleeding subsequently.  Hemoglobin is stable.  GI has signed off and recommend EGD in 2 months.  -Continue Protonix oral twice a day    Acute metabolic encephalopathy of unknown etiology Resolved.  CT of the head was negative for acute abnormality. -Fall precautions.  Monitor mental status  Acute hypoxic respiratory failure of unknown etiology Was intubated prior to EGD.  Self extubated on 03/06/2019. Currently on room air. Incentive spirometry Completed 6 days Rocephin  Volume overload EF 65 to 70% 03/05/2019 -Home Lasix restarted   Leukocytosis 11.7-13.4->12.9>13.1.     Afebrile and hemodynamically stable on room air.  No Obvious source of infection -Continue to monitor  Thrombocytopenia, reactive versus cirrhosis Improved, currently 111 -No transfusion at this time, monitor  Acute kidney injury -Resolved  Hypokalemia -Resolved  Hepatic steatosis Reported history of cirrhosis and  fatty liver -Outpatient follow-up with GI.    Hypertension Stable -Continue lisinopril.  Monitor blood pressure.  Hyperlipidemia -Continue statin  Chronic  pain -Home Suboxone has been restarted.  Continue gabapentin.   -Outpatient follow-up with pain management  Generalized deconditioning -Being evaluated for CIR  Morbid obesity -Outpatient follow-up -recommended weight loss  Insomnia on Ambien outpatient -Continue Ambien    DVT prophylaxis: SCD Diet: Dysphagia 3 Family Communication: No family at bedside Disposition Plan: Medically stable for discharge, discharge pending availability at [place] PCCM  Procedures  Intubation: 03/05/2019-03/06/2019 03/05/2019 EGD.Gastric source of bleeding of unclear locus. MD spent greater than 2.5hours clearing stomach of fresh blood and food but did not find active bleeding. There was a chain of lesions on a fold in the distal gastric fundus with purple discoloration, 1 with a diminutive umbilicated spot (?change of deflated fundic varices?)  03/06/2019 EGD.No esophageal or gastric varices. Very mild portal hypertensive gastropathy. Small, nonbleeding gastric ulcer with no bleeding stigmata in an area of localized erythema suspiciousfor NG tube trauma.  Antibiotics   Anti-infectives (From admission, onward)   Start     Dose/Rate Route Frequency Ordered Stop   03/05/19 0030  cefTRIAXone (ROCEPHIN) 2 g in sodium chloride 0.9 % 100 mL IVPB     2 g 200 mL/hr over 30 Minutes Intravenous Daily at bedtime 03/05/19 0001 03/09/19 2322   03/05/19 0030  metroNIDAZOLE (FLAGYL) IVPB 500 mg  Status:  Discontinued     500 mg 100 mL/hr over 60 Minutes Intravenous Every 8 hours 03/05/19 0001 03/07/19 0846           Subjective   We had a long talk today as patient stated he was feeling down today mentally. He was very upset that he had this 'near death experience' that led to this hospitalization. He is complaining of persistent bilateral lower extremity paresthesias to his feet which is not new but notices it has worsened since he has gained weight recently. Denies any  bowel/bladder incontinence. States that he knows of multiple herniated lumbar discs and has been seen in the past for it and was not a candidate for surgery at that time. Never had any epidural injections for this.   We talked about PT and weight loss once he is discharged. He understands he needs to lose weight and is on board with this.   Otherwise no complaints, full ROS negative.    Objective   Vitals:   03/12/19 0434 03/12/19 0532 03/12/19 0900 03/12/19 0901  BP: (!) 110/59 108/66 120/62 120/62  Pulse: 75 73 89   Resp: 14 13 15    Temp: 98.6 F (37 C)     TempSrc: Axillary     SpO2: 91% 97% 98%   Weight: (!) 200 kg     Height:        Intake/Output Summary (Last 24 hours) at 03/12/2019 1351 Last data filed at 03/12/2019 0900 Gross per 24 hour  Intake 1320 ml  Output 900 ml  Net 420 ml   Filed Weights   03/11/19 0653 03/12/19 0428 03/12/19 0434  Weight: (!) 191.2 kg (!) 200 kg (!) 200 kg    Examination:  Physical Exam Vitals signs and nursing note reviewed.  Constitutional:      Appearance: He is obese.  HENT:     Head: Normocephalic and atraumatic.     Mouth/Throat:     Mouth: Mucous membranes are moist.  Cardiovascular:     Rate and Rhythm: Normal  rate and regular rhythm.  Pulmonary:     Effort: Pulmonary effort is normal.     Breath sounds: Normal breath sounds.  Abdominal:     General: Abdomen is flat. Bowel sounds are normal.     Palpations: Abdomen is soft.     Tenderness: There is no abdominal tenderness.  Musculoskeletal:        General: No deformity.     Comments: 3+ bilateral lower extremity pitting edema No spinal tenderness to palpation  Neurological:     Mental Status: He is alert.     Comments: + straight leg raise test  Psychiatric:        Behavior: Behavior normal.     Data Reviewed: I have personally reviewed following labs and imaging studies  CBC: Recent Labs  Lab 03/05/19 2216  03/08/19 0949 03/09/19 0252 03/10/19 0756  03/11/19 0231 03/12/19 1227  WBC 45.0*   < > 13.4* 11.7* 13.4* 12.9* 13.1*  NEUTROABS 37.8*  --   --   --  10.0*  --   --   HGB 7.9*   < > 8.5* 7.8* 9.6* 8.5* 8.7*  HCT 24.6*   < > 27.1* 24.7* 29.0* 27.1* 27.1*  MCV 91.1   < > 94.1 92.2 88.4 92.5 90.0  PLT 213   < > 126* 117* 107* 111* 135*   < > = values in this interval not displayed.   Basic Metabolic Panel: Recent Labs  Lab 03/07/19 0119 03/08/19 0117 03/09/19 0252 03/10/19 0756 03/11/19 0231 03/12/19 1227  NA 143  --  139 139 139 137  K 3.4*  --  3.3* 4.8 4.2 4.3  CL 109  --  104 107 105 102  CO2 26  --  27 23 26 26   GLUCOSE 164*  --  99 125* 98 102*  BUN 56*  --  37* 26* 20 19  CREATININE 1.24  --  1.01 0.89 0.84 0.81  CALCIUM 8.3*  --  7.7* 8.0* 8.1* 8.1*  MG 2.4 2.6* 2.2 2.3  --   --   PHOS 1.6* 2.3* 4.2  --   --   --    GFR: Estimated Creatinine Clearance: 185.4 mL/min (by C-G formula based on SCr of 0.81 mg/dL). Liver Function Tests: Recent Labs  Lab 03/07/19 0119 03/09/19 0252  AST 96* 68*  ALT 94* 82*  ALKPHOS 51 56  BILITOT 0.6 0.9  PROT 5.3* 4.6*  ALBUMIN 2.8* 2.5*   No results for input(s): LIPASE, AMYLASE in the last 168 hours. Recent Labs  Lab 03/07/19 1022  AMMONIA 31   Coagulation Profile: Recent Labs  Lab 03/05/19 1430  INR 1.3*   Cardiac Enzymes: No results for input(s): CKTOTAL, CKMB, CKMBINDEX, TROPONINI in the last 168 hours. BNP (last 3 results) Recent Labs    01/24/19 1121 02/27/19 1106  PROBNP 52 37   HbA1C: No results for input(s): HGBA1C in the last 72 hours. CBG: Recent Labs  Lab 03/11/19 1219 03/11/19 1638 03/11/19 2237 03/12/19 0754 03/12/19 1153  GLUCAP 89 101* 95 121* 104*   Lipid Profile: No results for input(s): CHOL, HDL, LDLCALC, TRIG, CHOLHDL, LDLDIRECT in the last 72 hours. Thyroid Function Tests: No results for input(s): TSH, T4TOTAL, FREET4, T3FREE, THYROIDAB in the last 72 hours. Anemia Panel: No results for input(s): VITAMINB12, FOLATE,  FERRITIN, TIBC, IRON, RETICCTPCT in the last 72 hours. Sepsis Labs: Recent Labs  Lab 03/05/19 2211  LATICACIDVEN 3.2*    Recent Results (from the past 240 hour(s))  MRSA PCR Screening     Status: None   Collection Time: 03/04/19 10:39 PM   Specimen: Nasal Mucosa; Nasopharyngeal  Result Value Ref Range Status   MRSA by PCR NEGATIVE NEGATIVE Final    Comment:        The GeneXpert MRSA Assay (FDA approved for NASAL specimens only), is one component of a comprehensive MRSA colonization surveillance program. It is not intended to diagnose MRSA infection nor to guide or monitor treatment for MRSA infections. Performed at [facility], [place] [address], [place], [place] [address]   Culture, blood (routine x 2)     Status: None   Collection Time: 03/04/19 11:45 PM   Specimen: BLOOD RIGHT HAND  Result Value Ref Range Status   Specimen Description BLOOD RIGHT HAND  Final   Special Requests   Final    BOTTLES DRAWN AEROBIC AND ANAEROBIC Blood Culture adequate volume   Culture   Final    NO GROWTH 5 DAYS Performed at [facility], [place] [address], [place], [place] [address]    Report Status 03/10/2019 FINAL  Final  Culture, blood (routine x 2)     Status: None   Collection Time: 03/04/19 11:50 PM   Specimen: BLOOD RIGHT WRIST  Result Value Ref Range Status   Specimen Description BLOOD RIGHT WRIST  Final   Special Requests   Final    BOTTLES DRAWN AEROBIC AND ANAEROBIC Blood Culture results may not be optimal due to an inadequate volume of blood received in culture bottles   Culture   Final    NO GROWTH 5 DAYS Performed at [facility], [place] [address], [place], [place] [address]    Report Status 03/10/2019 FINAL  Final         Radiology Studies: No results found.      Scheduled Meds: . buprenorphine-naloxone  2 tablet Sublingual Daily  . chlorhexidine  15 mL Mouth Rinse BID  . Chlorhexidine Gluconate Cloth  6 each Topical Q0600  . feeding  supplement (PRO-STAT SUGAR FREE 64)  30 mL Per Tube BID  . furosemide  40 mg Oral q1800  . furosemide  80 mg Oral Daily  . gabapentin  800 mg Oral Q8H  . insulin aspart  0-15 Units Subcutaneous TID WC  . insulin aspart  0-5 Units Subcutaneous QHS  . lisinopril  30 mg Per Tube Daily  . mouth rinse  15 mL Mouth Rinse q12n4p  . metolazone  5 mg Oral See admin instructions  . pantoprazole  40 mg Oral BID  . pravastatin  20 mg Oral Daily  . senna-docusate  1 tablet Oral BID   Continuous Infusions: . sodium chloride Stopped (03/09/19 2252)     LOS: 8 days    Time spent: 30 minutes with over 50% of the time coordinating the patient's care    Harold Hedge, DO [facility] Pager [phone]  If 7PM-7AM, please contact night-coverage [email or web address] Password [email or web address] 03/12/2019, 1:51 PM

## 2019-03-13 ENCOUNTER — Inpatient Hospital Stay (HOSPITAL_COMMUNITY)
Admission: RE | Admit: 2019-03-13 | Discharge: 2019-03-23 | DRG: 945 | Disposition: A | Discharge status: Home Health | Payer: Medicare Other | Source: Intra-hospital | Attending: Physical Medicine & Rehabilitation | Admitting: Physical Medicine & Rehabilitation

## 2019-03-13 ENCOUNTER — Other Ambulatory Visit: Payer: Self-pay

## 2019-03-13 ENCOUNTER — Encounter (HOSPITAL_COMMUNITY): Payer: Self-pay | Admitting: *Deleted

## 2019-03-13 DIAGNOSIS — Z79899 Other long term (current) drug therapy: Secondary | ICD-10-CM

## 2019-03-13 DIAGNOSIS — R609 Edema, unspecified: Secondary | ICD-10-CM

## 2019-03-13 DIAGNOSIS — R600 Localized edema: Secondary | ICD-10-CM

## 2019-03-13 DIAGNOSIS — E785 Hyperlipidemia, unspecified: Secondary | ICD-10-CM | POA: Diagnosis present

## 2019-03-13 DIAGNOSIS — Z8249 Family history of ischemic heart disease and other diseases of the circulatory system: Secondary | ICD-10-CM | POA: Diagnosis not present

## 2019-03-13 DIAGNOSIS — Z8349 Family history of other endocrine, nutritional and metabolic diseases: Secondary | ICD-10-CM | POA: Diagnosis not present

## 2019-03-13 DIAGNOSIS — D696 Thrombocytopenia, unspecified: Secondary | ICD-10-CM

## 2019-03-13 DIAGNOSIS — G894 Chronic pain syndrome: Secondary | ICD-10-CM

## 2019-03-13 DIAGNOSIS — R5381 Other malaise: Secondary | ICD-10-CM

## 2019-03-13 DIAGNOSIS — K760 Fatty (change of) liver, not elsewhere classified: Secondary | ICD-10-CM | POA: Diagnosis present

## 2019-03-13 DIAGNOSIS — I119 Hypertensive heart disease without heart failure: Secondary | ICD-10-CM | POA: Diagnosis present

## 2019-03-13 DIAGNOSIS — D638 Anemia in other chronic diseases classified elsewhere: Secondary | ICD-10-CM | POA: Diagnosis not present

## 2019-03-13 DIAGNOSIS — D6959 Other secondary thrombocytopenia: Secondary | ICD-10-CM | POA: Diagnosis present

## 2019-03-13 DIAGNOSIS — E46 Unspecified protein-calorie malnutrition: Secondary | ICD-10-CM | POA: Diagnosis not present

## 2019-03-13 DIAGNOSIS — F1290 Cannabis use, unspecified, uncomplicated: Secondary | ICD-10-CM | POA: Diagnosis present

## 2019-03-13 DIAGNOSIS — E8809 Other disorders of plasma-protein metabolism, not elsewhere classified: Secondary | ICD-10-CM | POA: Diagnosis not present

## 2019-03-13 DIAGNOSIS — Z825 Family history of asthma and other chronic lower respiratory diseases: Secondary | ICD-10-CM

## 2019-03-13 DIAGNOSIS — E876 Hypokalemia: Secondary | ICD-10-CM | POA: Diagnosis not present

## 2019-03-13 DIAGNOSIS — E871 Hypo-osmolality and hyponatremia: Secondary | ICD-10-CM | POA: Diagnosis not present

## 2019-03-13 DIAGNOSIS — K5903 Drug induced constipation: Secondary | ICD-10-CM | POA: Diagnosis not present

## 2019-03-13 DIAGNOSIS — Z6841 Body Mass Index (BMI) 40.0 and over, adult: Secondary | ICD-10-CM

## 2019-03-13 DIAGNOSIS — K922 Gastrointestinal hemorrhage, unspecified: Secondary | ICD-10-CM | POA: Diagnosis present

## 2019-03-13 DIAGNOSIS — R932 Abnormal findings on diagnostic imaging of liver and biliary tract: Secondary | ICD-10-CM

## 2019-03-13 DIAGNOSIS — I10 Essential (primary) hypertension: Secondary | ICD-10-CM

## 2019-03-13 DIAGNOSIS — I952 Hypotension due to drugs: Secondary | ICD-10-CM | POA: Diagnosis not present

## 2019-03-13 DIAGNOSIS — M5126 Other intervertebral disc displacement, lumbar region: Secondary | ICD-10-CM | POA: Diagnosis present

## 2019-03-13 DIAGNOSIS — K254 Chronic or unspecified gastric ulcer with hemorrhage: Secondary | ICD-10-CM | POA: Diagnosis not present

## 2019-03-13 DIAGNOSIS — G4700 Insomnia, unspecified: Secondary | ICD-10-CM | POA: Diagnosis present

## 2019-03-13 DIAGNOSIS — K766 Portal hypertension: Secondary | ICD-10-CM | POA: Diagnosis present

## 2019-03-13 DIAGNOSIS — K3189 Other diseases of stomach and duodenum: Secondary | ICD-10-CM | POA: Diagnosis present

## 2019-03-13 DIAGNOSIS — M7989 Other specified soft tissue disorders: Secondary | ICD-10-CM | POA: Diagnosis not present

## 2019-03-13 DIAGNOSIS — F1120 Opioid dependence, uncomplicated: Secondary | ICD-10-CM | POA: Diagnosis present

## 2019-03-13 DIAGNOSIS — K7460 Unspecified cirrhosis of liver: Secondary | ICD-10-CM | POA: Diagnosis not present

## 2019-03-13 DIAGNOSIS — Z880 Allergy status to penicillin: Secondary | ICD-10-CM

## 2019-03-13 DIAGNOSIS — Z823 Family history of stroke: Secondary | ICD-10-CM

## 2019-03-13 HISTORY — DX: Other malaise: R53.81

## 2019-03-13 LAB — CBC
HCT: 26.9 % — ABNORMAL LOW (ref 39.0–52.0)
Hemoglobin: 8.4 g/dL — ABNORMAL LOW (ref 13.0–17.0)
MCH: 28.8 pg (ref 26.0–34.0)
MCHC: 31.2 g/dL (ref 30.0–36.0)
MCV: 92.1 fL (ref 80.0–100.0)
Platelets: 136 10*3/uL — ABNORMAL LOW (ref 150–400)
RBC: 2.92 MIL/uL — ABNORMAL LOW (ref 4.22–5.81)
RDW: 16.3 % — ABNORMAL HIGH (ref 11.5–15.5)
WBC: 10.5 10*3/uL (ref 4.0–10.5)
nRBC: 0 % (ref 0.0–0.2)

## 2019-03-13 LAB — BASIC METABOLIC PANEL
Anion gap: 10 (ref 5–15)
BUN: 19 mg/dL (ref 8–23)
CO2: 27 mmol/L (ref 22–32)
Calcium: 8.3 mg/dL — ABNORMAL LOW (ref 8.9–10.3)
Chloride: 99 mmol/L (ref 98–111)
Creatinine, Ser: 0.83 mg/dL (ref 0.61–1.24)
GFR calc Af Amer: >60 mL/min (ref >60)
GFR calc non Af Amer: >60 mL/min (ref >60)
Glucose, Bld: 92 mg/dL (ref 70–99)
Potassium: 3.9 mmol/L (ref 3.5–5.1)
Sodium: 136 mmol/L (ref 135–145)

## 2019-03-13 LAB — GLUCOSE, CAPILLARY
Glucose-Capillary: 96 mg/dL (ref 70–99)
Glucose-Capillary: 86 mg/dL (ref 70–99)
Glucose-Capillary: 105 mg/dL — ABNORMAL HIGH (ref 70–99)
Glucose-Capillary: 104 mg/dL — ABNORMAL HIGH (ref 70–99)

## 2019-03-13 LAB — MAGNESIUM: Magnesium: 2.2 mg/dL (ref 1.7–2.4)

## 2019-03-13 MED ORDER — PRO-STAT SUGAR FREE PO LIQD
30.0000 mL | Freq: Two times a day (BID) | ORAL | Status: DC
  Administered 2019-03-13 – 2019-03-23 (×20): 30 mL
  Filled 2019-03-13 (×20): qty 30

## 2019-03-13 MED ORDER — FUROSEMIDE 40 MG PO TABS
80.0000 mg | ORAL_TABLET | Freq: Every day | ORAL | Status: DC
  Administered 2019-03-14 – 2019-03-22 (×9): 80 mg via ORAL
  Filled 2019-03-13 (×10): qty 2

## 2019-03-13 MED ORDER — ACETAMINOPHEN 325 MG PO TABS
650.0000 mg | ORAL_TABLET | ORAL | Status: DC | PRN
  Administered 2019-03-13 – 2019-03-23 (×24): 650 mg via ORAL
  Filled 2019-03-13 (×25): qty 2

## 2019-03-13 MED ORDER — PRAVASTATIN SODIUM 10 MG PO TABS
20.0000 mg | ORAL_TABLET | Freq: Every day | ORAL | Status: DC
  Administered 2019-03-14 – 2019-03-23 (×10): 20 mg via ORAL
  Filled 2019-03-13 (×10): qty 2

## 2019-03-13 MED ORDER — ALBUTEROL SULFATE (2.5 MG/3ML) 0.083% IN NEBU: 2.5000 mg | INHALATION_SOLUTION | Freq: Four times a day (QID) | RESPIRATORY_TRACT | Status: DC | PRN

## 2019-03-13 MED ORDER — SENNOSIDES-DOCUSATE SODIUM 8.6-50 MG PO TABS: 1.0000 | ORAL_TABLET | Freq: Two times a day (BID) | ORAL | 0 refills | Status: AC

## 2019-03-13 MED ORDER — BUPRENORPHINE HCL-NALOXONE HCL 8-2 MG SL SUBL
2.0000 | SUBLINGUAL_TABLET | Freq: Every day | SUBLINGUAL | Status: DC
  Administered 2019-03-14 – 2019-03-23 (×10): 2 via SUBLINGUAL
  Filled 2019-03-13 (×10): qty 2

## 2019-03-13 MED ORDER — LISINOPRIL 20 MG PO TABS
30.0000 mg | ORAL_TABLET | Freq: Every day | ORAL | Status: DC
  Administered 2019-03-14 – 2019-03-16 (×3): 30 mg
  Filled 2019-03-13 (×3): qty 1

## 2019-03-13 MED ORDER — PNEUMOCOCCAL VAC POLYVALENT 25 MCG/0.5ML IJ INJ: 0.5000 mL | INJECTION | INTRAMUSCULAR | 0 refills | Status: DC

## 2019-03-13 MED ORDER — ZOLPIDEM TARTRATE 5 MG PO TABS
10.0000 mg | ORAL_TABLET | Freq: Every evening | ORAL | Status: DC | PRN
  Administered 2019-03-13 – 2019-03-22 (×10): 10 mg via ORAL
  Filled 2019-03-13 (×10): qty 2

## 2019-03-13 MED ORDER — SORBITOL 70 % SOLN
30.0000 mL | Freq: Every day | Status: DC | PRN
  Administered 2019-03-16: 30 mL via ORAL
  Filled 2019-03-13: qty 30

## 2019-03-13 MED ORDER — PANTOPRAZOLE SODIUM 40 MG PO TBEC: 40.0000 mg | DELAYED_RELEASE_TABLET | Freq: Two times a day (BID) | ORAL | 0 refills | Status: DC

## 2019-03-13 MED ORDER — FUROSEMIDE 40 MG PO TABS
40.0000 mg | ORAL_TABLET | Freq: Every day | ORAL | Status: DC
  Administered 2019-03-13 – 2019-03-22 (×10): 40 mg via ORAL
  Filled 2019-03-13 (×10): qty 1

## 2019-03-13 MED ORDER — PANTOPRAZOLE SODIUM 40 MG PO TBEC
40.0000 mg | DELAYED_RELEASE_TABLET | Freq: Two times a day (BID) | ORAL | Status: DC
  Administered 2019-03-13 – 2019-03-23 (×20): 40 mg via ORAL
  Filled 2019-03-13 (×20): qty 1

## 2019-03-13 MED ORDER — DULOXETINE HCL 30 MG PO CPEP: ORAL_CAPSULE | ORAL | 0 refills | Status: DC

## 2019-03-13 MED ORDER — DULOXETINE HCL 30 MG PO CPEP
30.0000 mg | ORAL_CAPSULE | Freq: Every day | ORAL | Status: AC
  Administered 2019-03-14 – 2019-03-18 (×5): 30 mg via ORAL
  Filled 2019-03-13 (×5): qty 1

## 2019-03-13 MED ORDER — METOLAZONE 5 MG PO TABS
5.0000 mg | ORAL_TABLET | Freq: Every day | ORAL | Status: DC
  Administered 2019-03-13 – 2019-03-15 (×3): 5 mg via ORAL
  Filled 2019-03-13 (×3): qty 1

## 2019-03-13 MED ORDER — GABAPENTIN 250 MG/5ML PO SOLN
800.0000 mg | Freq: Three times a day (TID) | ORAL | Status: DC
  Administered 2019-03-13 – 2019-03-23 (×29): 800 mg via ORAL
  Filled 2019-03-13 (×32): qty 18

## 2019-03-13 MED ORDER — POLYETHYLENE GLYCOL 3350 17 G PO PACK: 17.0000 g | PACK | Freq: Every day | ORAL | Status: DC | PRN

## 2019-03-13 MED ORDER — SENNOSIDES-DOCUSATE SODIUM 8.6-50 MG PO TABS
1.0000 | ORAL_TABLET | Freq: Two times a day (BID) | ORAL | Status: DC
  Administered 2019-03-13 – 2019-03-18 (×10): 1 via ORAL
  Filled 2019-03-13 (×10): qty 1

## 2019-03-13 NOTE — Plan of Care (Signed)
  Problem: Consults Goal: RH GENERAL PATIENT EDUCATION Description: See Patient Education module for education specifics. 03/13/2019 1705 by Malachi Paradise, RN Outcome: Progressing 03/13/2019 1700 by Malachi Paradise, RN Outcome: Progressing   Problem: RH BOWEL ELIMINATION Goal: RH STG MANAGE BOWEL WITH ASSISTANCE Description: STG Manage Bowel with Assistance. 03/13/2019 1705 by Malachi Paradise, RN Outcome: Progressing 03/13/2019 1700 by Malachi Paradise, RN Outcome: Progressing Goal: RH STG MANAGE BOWEL W/MEDICATION W/ASSISTANCE Description: STG Manage Bowel with Medication with Assistance. 03/13/2019 1705 by Malachi Paradise, RN Outcome: Progressing 03/13/2019 1700 by Malachi Paradise, RN Outcome: Progressing   Problem: RH BLADDER ELIMINATION Goal: RH STG MANAGE BLADDER WITH ASSISTANCE Description: STG Manage Bladder With Assistance 03/13/2019 1705 by Malachi Paradise, RN Outcome: Progressing 03/13/2019 1700 by Malachi Paradise, RN Outcome: Progressing Goal: RH STG MANAGE BLADDER WITH EQUIPMENT WITH ASSISTANCE Description: STG Manage Bladder With Equipment With Assistance 03/13/2019 1705 by Malachi Paradise, RN Outcome: Progressing 03/13/2019 1700 by Malachi Paradise, RN Outcome: Progressing   Problem: RH SKIN INTEGRITY Goal: RH STG SKIN FREE OF INFECTION/BREAKDOWN 03/13/2019 1705 by Malachi Paradise, RN Outcome: Progressing 03/13/2019 1700 by Malachi Paradise, RN Outcome: Progressing Goal: RH STG MAINTAIN SKIN INTEGRITY WITH ASSISTANCE Description: STG Maintain Skin Integrity With Assistance. 03/13/2019 1705 by Malachi Paradise, RN Outcome: Progressing 03/13/2019 1700 by Malachi Paradise, RN Outcome: Progressing   Problem: RH SAFETY Goal: RH STG ADHERE TO SAFETY PRECAUTIONS W/ASSISTANCE/DEVICE Description: STG Adhere to Safety Precautions With Assistance/Device. 03/13/2019 1705 by Malachi Paradise, RN Outcome: Progressing 03/13/2019 1700 by Malachi Paradise, RN Outcome: Progressing

## 2019-03-13 NOTE — Consult Note (Addendum)
[facility] Progress Note  CC:  GIB  Subjective:  Called back to see the patient for black stool.  Patient having solid black stools that have required laxatives to produce.  Hgb stable over the past several days.  Eating breakfast as we spoke.    Objective:  Vital signs in last 24 hours: Temp:  [97.4 F (36.3 C)-99.1 F (37.3 C)] 98.1 F (36.7 C) (11/30 0800) Pulse Rate:  [62-85] 75 (11/30 0800) Resp:  [12-21] 18 (11/30 0800) BP: (91-127)/(50-79) 114/72 (11/30 0800) SpO2:  [91 %-99 %] 95 % (11/30 0800) Weight:  [184.8 kg] 184.8 kg (11/30 0642) Last BM Date: 03/12/19 General:  Alert, Well-developed, in NAD Heart:  Regular rate and rhythm; no murmurs Pulm:  CTAB.  No increased WOB. Abdomen:  Soft, obese, firm.  BS present.  Non-tender. Extremities:  2-3+ pitting edema in B/L LE's. Neurologic:  Alert and oriented x 4;  grossly normal neurologically.  Still seems a little confused and unsure about events here in the hospital.  Intake/Output from previous day: 11/29 0701 - 11/30 0700 In: 1440 [P.O.:1440] Out: -   Lab Results: Recent Labs    03/11/19 0231 03/12/19 1227 03/13/19 0312  WBC 12.9* 13.1* 10.5  HGB 8.5* 8.7* 8.4*  HCT 27.1* 27.1* 26.9*  PLT 111* 135* 136*   BMET Recent Labs    03/11/19 0231 03/12/19 1227 03/13/19 0739  NA 139 137 136  K 4.2 4.3 3.9  CL 105 102 99  CO2 _0 GLUCOSE 98 102* 92  BUN _1 CREATININE 0.84 0.81 0.83  CALCIUM 8.1* 8.1* 8.3*   Assessment / Plan: *   Massive upper GI bleed w hypovolemic shock.  Black stools at outside hospital. 03/05/2019 EGD.  Gastric source of bleeding of unclear locus.  MD spent greater than 2.5 hours clearing stomach of fresh blood and food but did not find active bleeding.  There was a chain of lesions on a fold in the distal gastric fundus with purple discoloration, 1 with a diminutive umbilicated spot (? change of deflated fundic varices?) 03/06/2019 EGD.  No esophageal or  gastric varices.  Very mild portal hypertensive gastropathy.  Small, nonbleeding gastric ulcer with no bleeding stigmata in an area of localized erythema suspicious for NG tube trauma. BID PO Protonix in place.   Consider repeat EGD in 2 months to assess for healing of the gastric ulcer given the unusual location. GI called back today, 11/30, for black stools.  Hgb stable over the past several days.  *    Hepatic steatosis on abdominal ultrasound and CTAP in [place]. Reported history cirrhosis and fatty liver. Mild elevation of Transaminases, normal bili and alk phos earlier in admission.  Not checked recently.  *    Transfusion requiring anemia.  3 PRBCs at [place].  No transfusions at [facility] hospital since admission 11/21.  Hgb stable at 8.4 from 8.7 yesterday and 8.5 two days ago.    *    AKI:  Resolved.  *    Acute encephalopathy.  CT head 11/24 normal  *     Leukocytosis.  Bibasilar atelectasis vs infiltrate, cardiomegaly and vascular congestion on CXR.  WBC count normal today.  -Does not appear to necessarily be having any active bleeding.  Suspect may still old blood since he had not passed stool for several days. -Continue to monitor Hgb and appearance of stools.   LOS: 9 days   Laban Emperor. Zehr  03/13/2019, 9:03 AM      Attending Physician Note   I have taken an interval history, reviewed the chart and examined the patient. I agree with the Advanced Practitioner's note, impression and recommendations.   1. Major UGI bleed with shock, ABL anemia, resolved, felt to be a gastric source however only a small ulcer, gastropathy noted on EGD #2 and a large volume of gastric blood and possible gastric varix on EGD #1. A Dieulafoy's lesion or an AVM can be challenging to locate on EGD. After no BM for several days he is now passing dark stools which is very likely old blood. His Hgb is stable for past few days. Stools should gradually return to normal color over a few days. Trend  Hgb over next few days and please contact us if a decreasing trend suggests active GI bleeding.   2. Nodular liver contour on 03/04/2019 CT, possible cirrhosis. Fatty liver on 03/02/2019 abd [place]. Further follow up as outpatient.   Continue pantoprazole 40 mg po bid until GI outpatient follow up.  Avoid NSAIDs, etoh. GI signing off. Inpatient GI available as needed.  Outpatient GI follow up in a few weeks with Dr. Lyda Jester in [place] or Dr. Loletha Carrow in [place].   Lucio Edward, MD [facility]

## 2019-03-13 NOTE — Progress Notes (Signed)
Patient arrived to the floor with son at bedside. Patient sitting on bed with c/o lower back pain and buttock pain, 7 on pain scale, described as sharp and chronic per patient. Patient was given thin liquids per request. RN completed admission questions and reviewed all necessary information. Explained fall precautions interventions and gave patients Bill of Rights.Patient stated he gave his belongings to his son and only has a walking stick and cell phone in possession.

## 2019-03-13 NOTE — Care Management Important Message (Signed)
Important Message  Patient Details  Name: Charles Gray MRN: [id number] Date of Birth: 1956-01-09   Medicare Important Message Given:  Yes     Shaquela Weichert 03/13/2019, 4:02 PM

## 2019-03-13 NOTE — Progress Notes (Signed)
Patient transferred to 4W-03 from 5W. Report called to Manuela Schwartz on 4W. Patient alert and oriented x4 and stable at the time of discharge. Skin free of breakdown at time of transfer. IV removed and intact. Patient settled into new room with son at bedside.

## 2019-03-13 NOTE — Progress Notes (Signed)
Charlett Blake, MD  Physician  Physical Medicine and Rehabilitation  PMR Pre-admission  Addendum  Date of Service:  03/13/2019 1:18 PM      Related encounter: Admission (Discharged) from 03/04/2019 in [place] all _0 Manual_1 Template_2 Copied  Added by: _3 Cristina Gong, RN_4 Charlett Blake, MD  _5 Hover for details PMR Admission Coordinator Pre-Admission Assessment  Patient: Charles Gray is an 63 y.o., male MRN: [id number] DOB: 1955-05-24 Height: _6  (208.3 cm) Weight: (!) 184.8 kg  Insurance Information HMO:     PPO:      PCP:      IPA:      80/20:      OTHER:  PRIMARY: Medicare      Policy#: [id number]      Subscriber: pt Benefits:  Phone #: passport one online     Name: 11/30 Eff. Date: 12/13/1994     Deduct: $1408      Out of Pocket Max: none      Life Max: none CIR: 100%      SNF: 20 full days Outpatient: 80%     Co-Pay: 20% Home Health: 100%      Co-Pay: none DME: 80%     Co-Pay: 20% Providers: pt choice  SECONDARY: Medicaid of [place]      Policy#: [id number] k Active 11/30 MADCY      Medicaid Application Date:       Case Manager:  Disability Application Date:       Case Worker:   The "Data Collection Information Summary" for patients in Inpatient Rehabilitation Facilities with attached "Privacy Act [place] Records" was provided and verbally reviewed with: Patient  Emergency Contact Information         Contact Information    Name Relation Home Work Mobile   [place] Son   [phone]      Current Medical History  Patient Admitting Diagnosis: Debility  History of Present Illness:63 year old right-handed male with history of hyperlipidemia, asthma, cirrhosis, hypertension, herniated disc maintained on suboxone2 tablets daily followed by pain management, morbid obesity BMI 42.60.Presented 03/02/2019 to [facility] with increasing shortness of breath leg  edema. During his hospital course acute drop in blood pressure as well as hemoglobin with melanotic stool. He had increasing leukocytosis and lactic acidosis but was afebrile. Placed on broad-spectrum antibiotics. CT of abdomen pelvis and abdominal CTA without obvious source of bleeding. Hemoglobin did drop from 14-7.5. He was given 3 units packed red blood cells placed on Protonix and transferred to [facility] 03/04/2019 for further evaluation. Patient did require intubation for airway protection. Underwent upper GI endoscopy 03/05/2019 per gastroenterology Dr. Pura Spice no varices seen. A large amount of fresh and clotted blood was found in the gastric fundus and in the proximal gastric body. Normal esophagus. Very mild portal hypertensive gastropathy. There was a chain of lesions on a fold in the distal gastric fundus with purple discoloration. Patient self extubated 03/06/2019. Patient with initial bouts of confusion and agitation cranial CT scan 03/07/2019 Negative.Hemoglobin is stabilized 8.4. Recommendations are currently to repeat EGD in 2 months and continue Protonix twice daily. He is tolerating a mechanical soft diet. Noted thrombocytopenia improved to 111-143flt to be reactive versus cirrhosis.   [facility] has been reviewed by the rehabilitation admission coordinator and physician.  Past Medical History      Past Medical History:  Diagnosis Date  . Fatty liver disease, nonalcoholic   .  Hyperlipidemia 01/24/2019  . Hypertension 01/24/2019  . Insomnia 01/24/2019  . Lumbar disc herniation 01/24/2019    Family History   family history includes CAD in his maternal grandfather and maternal grandmother; Hyperlipidemia in his mother; Hypertension in his mother; Stroke in his mother.  Prior Rehab/Hospitalizations Has the patient had prior rehab or hospitalizations prior to admission? Yes  Has the patient had  major surgery during 100 days prior to admission? Yes             Current Medications  Current Facility-Administered Medications:  .  0.9 %  sodium chloride infusion, , Intravenous, PRN, Harold Hedge, MD, Stopped at 03/09/19 2252 .  acetaminophen (TYLENOL) tablet 650 mg, 650 mg, Oral, Q4H PRN, Harold Hedge, MD, 650 mg at 03/13/19 0902 .  albuterol (PROVENTIL) (2.5 MG/3ML) 0.083% nebulizer solution 2.5 mg, 2.5 mg, Inhalation, Q6H PRN, Harold Hedge, MD .  buprenorphine-naloxone (SUBOXONE) 8-2 mg per SL tablet 2 tablet, 2 tablet, Sublingual, Daily, Harold Hedge, MD, 2 tablet at 03/13/19 0857 .  chlorhexidine (PERIDEX) 0.12 % solution 15 mL, 15 mL, Mouth Rinse, BID, Harold Hedge, MD, 15 mL at 03/13/19 0858 .  Chlorhexidine Gluconate Cloth 2 % PADS 6 each, 6 each, Topical, Q0600, Harold Hedge, MD, 6 each at 03/12/19 1213 .  DULoxetine (CYMBALTA) DR capsule 30 mg, 30 mg, Oral, Daily, Harold Hedge, MD, 30 mg at 03/13/19 0858 .  feeding supplement (PRO-STAT SUGAR FREE 64) liquid 30 mL, 30 mL, Per Tube, BID, Harold Hedge, MD, 30 mL at 03/13/19 0858 .  furosemide (LASIX) tablet 40 mg, 40 mg, Oral, q1800, Harold Hedge, MD, 40 mg at 03/12/19 1843 .  furosemide (LASIX) tablet 80 mg, 80 mg, Oral, Daily, Harold Hedge, MD, 80 mg at 03/13/19 0857 .  gabapentin (NEURONTIN) 250 MG/5ML solution 800 mg, 800 mg, Oral, Q8H, Marva Panda E, MD, 800 mg at 03/13/19 0973 .  haloperidol lactate (HALDOL) injection 5 mg, 5 mg, Intravenous, Q6H PRN, Harold Hedge, MD, 5 mg at 03/07/19 2104 .  insulin aspart (novoLOG) injection 0-15 Units, 0-15 Units, Subcutaneous, TID WC, Harold Hedge, MD, 2 Units at 03/12/19 0900 .  insulin aspart (novoLOG) injection 0-5 Units, 0-5 Units, Subcutaneous, QHS, Marva Panda E, MD .  lisinopril (ZESTRIL) tablet 30 mg, 30 mg, Per Tube, Daily, Harold Hedge, MD, 30 mg at 03/13/19 0857 .  MEDLINE mouth rinse, 15 mL, Mouth Rinse, q12n4p, Harold Hedge, MD, 15 mL at 03/10/19  1728 .  metolazone (ZAROXOLYN) tablet 5 mg, 5 mg, Oral, Daily, Harold Hedge, MD, 5 mg at 03/12/19 1843 .  ondansetron (ZOFRAN) injection 4 mg, 4 mg, Intravenous, Q6H PRN, Harold Hedge, MD .  pantoprazole (PROTONIX) EC tablet 40 mg, 40 mg, Oral, BID, Harold Hedge, MD, 40 mg at 03/13/19 0857 .  phenol (CHLORASEPTIC) mouth spray 1 spray, 1 spray, Mouth/Throat, PRN, Harold Hedge, MD, 1 spray at 03/09/19 0840 .  pneumococcal 23 valent vaccine (PNEUMOVAX-23) injection 0.5 mL, 0.5 mL, Intramuscular, Tomorrow-1000, Segal, Jared E, MD .  polyethylene glycol (MIRALAX / GLYCOLAX) packet 17 g, 17 g, Oral, Daily PRN, Harold Hedge, MD, 17 g at 03/12/19 0858 .  pravastatin (PRAVACHOL) tablet 20 mg, 20 mg, Oral, Daily, Harold Hedge, MD, 20 mg at 03/13/19 0857 .  senna-docusate (Senokot-S) tablet 1 tablet, 1 tablet, Oral, BID, Harold Hedge, MD, 1 tablet at 03/12/19 2111 .  zolpidem (AMBIEN) tablet 10  mg, 10 mg, Oral, QHS PRN, Harold Hedge, MD, 10 mg at 03/12/19 2118  Patients Current Diet:     Diet Order                  Diet - low sodium heart healthy         DIET DYS 3 Room service appropriate? Yes; Fluid consistency: Thin  Diet effective now               Precautions / Restrictions Precautions Precautions: Fall Precaution Comments: monitor SpO2 with activity Restrictions Weight Bearing Restrictions: No   Has the patient had 2 or more falls or a fall with injury in the past year? No  Prior Activity Level Limited Community (1-2x/wk): Does not drive; no car  Prior Functional Level Self Care: Did the patient need help bathing, dressing, using the toilet or eating? Independent  Indoor Mobility: Did the patient need assistance with walking from room to room (with or without device)? Independent  Stairs: Did the patient need assistance with internal or external stairs (with or without device)? Independent  Functional Cognition: Did the patient need help  planning regular tasks such as shopping or remembering to take medications? Independent  Home Assistive Devices / Equipment Home Equipment: None  Prior Device Use: Indicate devices/aids used by the patient prior to current illness, exacerbation or injury? None of the above  Current Functional Level Cognition  Overall Cognitive Status: Within Functional Limits for tasks assessed Orientation Level: Oriented X4    Extremity Assessment (includes Sensation/Coordination)  Upper Extremity Assessment: Generalized weakness  Lower Extremity Assessment: Generalized weakness    ADLs  Overall ADL's : Needs assistance/impaired Eating/Feeding: Independent, Sitting Grooming: Set up, Sitting Upper Body Bathing: Set up, Sitting Lower Body Bathing: Moderate assistance Lower Body Bathing Details (indicate cue type and reason): Mod A +2 sit<>stand Upper Body Dressing : Set up, Sitting Lower Body Dressing: Maximal assistance Lower Body Dressing Details (indicate cue type and reason): Mod A +2 sit<>stand Toilet Transfer: Moderate assistance, Ambulation, BSC, Requires wide/bariatric Toileting- Clothing Manipulation and Hygiene: Total assistance Toileting - Clothing Manipulation Details (indicate cue type and reason): Mod A +2 sit<>stand    Mobility  Overal bed mobility: Needs Assistance Bed Mobility: Supine to Sit, Sit to Supine Supine to sit: HOB elevated, Mod assist General bed mobility comments: sitting in recliner chair when OT entered the room    Transfers  Overall transfer level: Needs assistance Equipment used: Rolling walker (2 wheeled) Transfers: Sit to/from Stand Sit to Stand: Min assist, From elevated surface Stand pivot transfers: Min assist General transfer comment: Min from elevated surface, Pt performed x 3 sit<>stands throughout session in order to pull pants over hips and for gait    Ambulation / Gait / Stairs / Wheelchair Mobility  Ambulation/Gait Ambulation/Gait  assistance: Min assist, +2 safety/equipment Gait Distance (Feet): 12 Feet(+14 ft) Assistive device: Rolling walker (2 wheeled) Gait Pattern/deviations: Step-to pattern, Decreased step length - right, Decreased step length - left, Shuffle, Trunk flexed, Wide base of support General Gait Details: Pt with slow shuffling pattern with poor foot clearance Bilat.  Pt required cues for increasing step length/height and improving upright posture. Gait velocity: decr Gait velocity interpretation: <1.31 ft/sec, indicative of household ambulator    Posture / Balance Dynamic Sitting Balance Sitting balance - Comments: dynamic stting balance with supervision while looping LEs through pants Balance Overall balance assessment: Needs assistance Sitting-balance support: No upper extremity supported Sitting balance-Leahy Scale: Good Sitting balance - Comments:  dynamic stting balance with supervision while looping LEs through pants Standing balance support: Bilateral upper extremity supported Standing balance-Leahy Scale: Poor Standing balance comment: reliant on UE support with RW for balance    Special needs/care consideration BiPAP/CPAP  CPM  Continuous Drip IV  Dialysis         Days  Life Vest  Oxygen  Special Bed  Trach Size  Wound Vac (area)       Location  Skin dry cracking skin to feet Bilaterally; MASD to buttocks and groin Bowel mgmt: continent LBM 11/29 Bladder mgmt: continent Diabetic mgmt:  Behavioral consideration  Chemo/radiation  Visitor is son, [name]   Previous Home Environment  Living Arrangements: Alone  Lives With: Alone Available Help at Discharge: Family, Available PRN/intermittently Type of Home: (second floor) Home Layout: One level Home Access: Stairs to enter Entrance Stairs-Rails: Left, Right Entrance Stairs-Number of Steps: 18 steps Bathroom Shower/Tub: Tub/shower unit, Architectural technologist: Standard Bathroom Accessibility: Yes How Accessible: Accessible  via walker Home Care Services: No Additional Comments: (can go stay with son on one level, but he does not want to)  Discharge Living Setting Plans for Discharge Living Setting: Alone, Apartment Type of Home at Discharge: Apartment(second level apartment) Discharge Home Layout: One level Discharge Home Access: Stairs to enter Entrance Stairs-Rails: Right, Left Entrance Stairs-Number of Steps: 18 Discharge Bathroom Shower/Tub: Tub/shower unit, Curtain Discharge Bathroom Toilet: Standard Discharge Bathroom Accessibility: Yes How Accessible: Accessible via walker Does the patient have any problems obtaining your medications?: No  Social/Family/Support Systems Patient Roles: Parent Contact Information: son, [name] Anticipated Caregiver: son prn only; lives 3 miles away Anticipated Caregiver's Contact Information: see above Ability/Limitations of Caregiver: son has his own family and job Caregiver Availability: Intermittent Discharge Plan Discussed with Primary Caregiver: Yes Is Caregiver In Agreement with Plan?: Yes Does Caregiver/Family have Issues with Lodging/Transportation while Pt is in Rehab?: No  Goals/Additional Needs Patient/Family Goal for Rehab: Mod I with PT  and OT Expected length of stay: ELOS 7 to 10 days Equipment Needs: Bariatric DME; Pt refuses big boy bed; he is claustrophobic Pt/Family Agrees to Admission and willing to participate: Yes Program Orientation Provided & Reviewed with Pt/Caregiver Including Roles  & Responsibilities: Yes  Barriers to Discharge: Weight, Other (comments)(suboxone daily)  Decrease burden of Care through IP rehab admission: n/a  Possible need for SNF placement upon discharge: pt refuses  Patient Condition: I have reviewed medical records from [facility] , spoken with CM, and patient. I met with patient at the bedside for inpatient rehabilitation assessment.  Patient will benefit from ongoing PT and OT, can actively  participate in 3 hours of therapy a day 5 days of the week, and can make measurable gains during the admission.  Patient will also benefit from the coordinated team approach during an Inpatient Acute Rehabilitation admission.  The patient will receive intensive therapy as well as Rehabilitation physician, nursing, social worker, and care management interventions.  Due to bladder management, bowel management, safety, skin/wound care, disease management, medication administration, pain management and patient education the patient requires 24 hour a day rehabilitation nursing.  The patient is currently min assist with mobility and basic ADLs.  Discharge setting and therapy post discharge at home with home health is anticipated.  Patient has agreed to participate in the Acute Inpatient Rehabilitation Program and will admit today.  Preadmission Screen Completed By:  Cleatrice Burke, 03/13/2019 1:19 PM ______________________________________________________________________   Discussed status with Dr. Letta Pate on  03/13/2019 at  1329 and received approval for admission today.  Admission Coordinator:  Cleatrice Burke, RN, time 1657 Date 03/13/2019   Assessment/Plan: 1. Diagnosis:Debility due to GI bleed Does the need for close, 24 hr/day Medical supervision in concert with the patient's rehab needs make it unreasonable for this patient to be served in a less intensive setting? Yes 2. Co-Morbidities requiring supervision/potential complications: CHF, NAFLD 3. Due to bladder management, bowel management, safety, skin/wound care, disease management, medication administration, pain management and patient education, does the patient require 24 hr/day rehab nursing? Yes 4. Does the patient require coordinated care of a physician, rehab nurse, PT, OT,  to address physical and functional deficits in the context of the above medical diagnosis(es)? Yes Addressing deficits in the following areas:  balance, endurance, locomotion, strength, transferring, bowel/bladder control, bathing, dressing, grooming, toileting and psychosocial support 5. Can the patient actively participate in an intensive therapy program of at least 3 hrs of therapy 5 days a week? Yes 6. The potential for patient to make measurable gains while on inpatient rehab is good 7. Anticipated functional outcomes upon discharge from inpatient rehab: modified independent PT, supervision and min assist OT, n/a SLP 8. Estimated rehab length of stay to reach the above functional goals is: 14-17d 9. Anticipated discharge destination: Home 10. Overall Rehab/Functional Prognosis: good   MD Signature: "I have personally performed a face to face diagnostic evaluation of this patient.    Charlett Blake M.D. [facility] FAAPM&R (Sports Med, Neuromuscular Med) Diplomate Am Board of Electrodiagnostic Med     Revision History

## 2019-03-13 NOTE — H&P (Signed)
Physical Medicine and Rehabilitation Admission H&P     HPI: Charles Gray is a 63 year old right-handed male with history of hyperlipidemia, asthma, cirrhosis, hypertension, herniated disc maintained on suboxone 2 tablets daily followed by pain management, morbid obesity BMI 42.60.  Per chart review patient lives alone.  Independent prior to admission his son does do the grocery shopping for him.  1 level home with stairs to entry.  Presented 03/02/2019 to [facility] with increasing shortness of breath leg edema.  During his hospital course acute drop in blood pressure as well as hemoglobin with melanotic stool.  He had increasing leukocytosis and lactic acidosis but was afebrile.  Placed on broad-spectrum antibiotics.  CT of abdomen pelvis and abdominal CTA without obvious source of bleeding.  Hemoglobin did drop from 14-7.5.  He was given 3 units packed red blood cells placed on Protonix and transferred to [facility] 03/04/2019 for further evaluation.  Patient did require intubation for airway protection.  Underwent upper GI endoscopy 03/05/2019 per gastroenterology Dr. Loletha Carrow with no varices seen.  A large amount of fresh and clotted blood was found in the gastric fundus and in the proximal gastric body.  Normal esophagus.  Very mild portal hypertensive gastropathy.  There was a chain of lesions on a fold in the distal gastric fundus with purple discoloration.  Patient self extubated 03/06/2019.  Patient with initial bouts of confusion and agitation cranial CT scan 03/07/2019  Negative.  Hemoglobin is stabilized 8.4.  Recommendations are currently to repeat EGD in 2 months and continue Protonix twice daily.  He is tolerating a mechanical soft diet.  Noted thrombocytopenia improved to 111-136 felt to be reactive versus cirrhosis.  Therapy evaluations completed patient was admitted for a comprehensive rehab program  Review of Systems  Constitutional: Negative for chills and fever.  HENT:  Negative for hearing loss.   Eyes: Negative for blurred vision and double vision.  Respiratory: Positive for shortness of breath. Negative for cough.   Cardiovascular: Positive for leg swelling. Negative for chest pain and palpitations.  Gastrointestinal: Positive for blood in stool. Negative for heartburn, nausea and vomiting.  Genitourinary: Negative for dysuria, flank pain and hematuria.  Musculoskeletal: Positive for myalgias.  Skin: Negative for rash.  Psychiatric/Behavioral: The patient has insomnia.   All other systems reviewed and are negative.  Past Medical History:  Diagnosis Date  . Fatty liver disease, nonalcoholic   . Hyperlipidemia 01/24/2019  . Hypertension 01/24/2019  . Insomnia 01/24/2019  . Lumbar disc herniation 01/24/2019   Past Surgical History:  Procedure Laterality Date  . ESOPHAGOGASTRODUODENOSCOPY (EGD) WITH PROPOFOL N/A 03/05/2019   Procedure: ESOPHAGOGASTRODUODENOSCOPY (EGD) WITH PROPOFOL;  Surgeon: Doran Stabler, MD;  Location: [place];  Service: Gastroenterology;  Laterality: N/A;  Large Blood Clot Removed  . ESOPHAGOGASTRODUODENOSCOPY (EGD) WITH PROPOFOL N/A 03/06/2019   Procedure: ESOPHAGOGASTRODUODENOSCOPY (EGD) WITH PROPOFOL;  Surgeon: Thornton Park, MD;  Location: [place];  Service: Gastroenterology;  Laterality: N/A;  . HEMORROIDECTOMY     Family History  Problem Relation Age of Onset  . Hyperlipidemia Mother   . Hypertension Mother   . Stroke Mother   . CAD Maternal Grandmother   . CAD Maternal Grandfather    Social History:  reports that he has never smoked. He has never used smokeless tobacco. He reports previous drug use. Drug: Marijuana. He reports that he does not drink alcohol. Allergies:  Allergies  Allergen Reactions  . Penicillins     Did it involve swelling of the face/tongue/throat,  SOB, or low BP? N/A Did it involve sudden or severe rash/hives, skin peeling, or any reaction on the inside of your mouth or nose?  N/A Did you need to seek medical attention at a hospital or doctor's office?N/A When did it last happen?N/A If all above answers are "NO", may proceed with cephalosporin use.   Medications Prior to Admission  Medication Sig Dispense Refill  . buprenorphine-naloxone (SUBOXONE) 8-2 mg SUBL SL tablet Place 2 tablets under the tongue daily.     . furosemide (LASIX) 40 MG tablet Take 2 tabs (80 mg ) in the Am and 1 tab (40 Mg) in the PM (Patient taking differently: Take 40 mg by mouth See admin instructions. Take 2 tablets by mouth (80 mg ) in the morning, then take 1 tablet by mouth (40 Mg) in the afternoon) 180 tablet 1  . gabapentin (NEURONTIN) 800 MG tablet Take 800 mg by mouth 3 (three) times daily.    Marland Kitchen lisinopril (ZESTRIL) 30 MG tablet 30 mg daily.    . metolazone (ZAROXOLYN) 5 MG tablet Take 1 tab daily 3 0 minutes prior to lasix (Patient taking differently: Take 5 mg by mouth See admin instructions. Take 1 tablet by mouth daily 30 minutes prior to lasix) 90 tablet 1  . potassium chloride SA (KLOR-CON) 20 MEQ tablet Take 20 mEq by mouth daily.     . pravastatin (PRAVACHOL) 20 MG tablet Take 20 mg by mouth daily.     . VENTOLIN HFA 108 (90 Base) MCG/ACT inhaler Inhale 1 puff into the lungs every 4 (four) hours as needed for shortness of breath.     . zolpidem (AMBIEN) 10 MG tablet Take 10 mg by mouth at bedtime as needed for sleep.       Drug Regimen Review Drug regimen was reviewed and remains appropriate with no significant issues identified  Home: Home Living Family/patient expects to be discharged to:: Private residence Living Arrangements: Alone Available Help at Discharge: Family, Available PRN/intermittently Type of Home: Apartment Home Access: Stairs to enter Technical brewer of Steps: flight Entrance Stairs-Rails: Left, Right Home Layout: One level Bathroom Shower/Tub: Chiropodist: Standard Home Equipment: None Additional Comments: Does not want  to go live with son   Functional History: Prior Function Level of Independence: Independent Comments: son brings him groceries  Functional Status:  Mobility: Bed Mobility Overal bed mobility: Needs Assistance Bed Mobility: Supine to Sit, Sit to Supine Supine to sit: HOB elevated, Mod assist General bed mobility comments: pt sitting EOB upon PT arrival Transfers Overall transfer level: Needs assistance Equipment used: Rolling walker (2 wheeled) Transfers: Sit to/from Stand Sit to Stand: Min assist, From elevated surface Stand pivot transfers: Min assist General transfer comment: Min from elevated surface, Pt performed x 3 sit<>stands throughout session in order to pull pants over hips and for gait Ambulation/Gait Ambulation/Gait assistance: Min assist, +2 safety/equipment Gait Distance (Feet): 12 Feet(+14 ft) Assistive device: Rolling walker (2 wheeled) Gait Pattern/deviations: Step-to pattern, Decreased step length - right, Decreased step length - left, Shuffle, Trunk flexed, Wide base of support General Gait Details: Pt with slow shuffling pattern with poor foot clearance Bilat.  Pt required cues for increasing step length/height and improving upright posture. Gait velocity: decr Gait velocity interpretation: <1.31 ft/sec, indicative of household ambulator    ADL: ADL Overall ADL's : Needs assistance/impaired Eating/Feeding: Independent, Sitting Grooming: Set up, Sitting Upper Body Bathing: Set up, Sitting Lower Body Bathing: Moderate assistance Lower Body Bathing Details (indicate cue  type and reason): Mod A +2 sit<>stand Upper Body Dressing : Set up, Sitting Lower Body Dressing: Maximal assistance Lower Body Dressing Details (indicate cue type and reason): Mod A +2 sit<>stand Toilet Transfer: Moderate assistance, Ambulation, BSC, Requires wide/bariatric Toileting- Clothing Manipulation and Hygiene: Total assistance Toileting - Clothing Manipulation Details (indicate cue  type and reason): Mod A +2 sit<>stand  Cognition: Cognition Overall Cognitive Status: Within Functional Limits for tasks assessed Orientation Level: Oriented X4 Cognition Arousal/Alertness: Awake/alert Behavior During Therapy: WFL for tasks assessed/performed Overall Cognitive Status: Within Functional Limits for tasks assessed  Physical Exam: Blood pressure 114/72, pulse 75, temperature 98.1 F (36.7 C), temperature source Oral, resp. rate 18, height 6' 10"  (2.083 m), weight (!) 184.8 kg, SpO2 95 %. Physical Exam  Neurological:  Patient is alert no acute distress.  Makes good eye contact follows commands.  He cannot recall the first few days of his hospital admission.  He is able to provide his name age and date of birth.   General: No acute distress Mood and affect are appropriate Heart: Regular rate and rhythm no rubs murmurs or extra sounds Lungs: Clear to auscultation, breathing unlabored, no rales or wheezes Abdomen: Positive bowel sounds, soft nontender to palpation, nondistended Extremities: No clubbing, cyanosis, or edema Skin: No evidence of breakdown, stasis dermatitis bilateral pretibial area Neurologic: Cranial nerves II through XII intact, motor strength is 5/5 in bilateral deltoid, bicep, tricep, grip, 3 -/5 hip flexor, knee extensors, ankle dorsiflexor and plantar flexor Sensory exam normal sensation to light touch in bilateral upper and lower extremities Cerebellar exam normal finger to nose to finger    Musculoskeletal: Mildly limited hip internal and external rotation, no pain with knee range of motion or ankle range of motion.  There is no evidence of effusion at the knees.  Ext 2+ edema bilateral pretib, 3+ bilateral dorsum of feet  Results for orders placed or performed during the hospital encounter of 03/04/19 (from the past 48 hour(s))  Glucose, capillary     Status: Abnormal   Collection Time: 03/11/19  4:38 PM  Result Value Ref Range   Glucose-Capillary 101  (H) 70 - 99 mg/dL  Glucose, capillary     Status: None   Collection Time: 03/11/19 10:37 PM  Result Value Ref Range   Glucose-Capillary 95 70 - 99 mg/dL  Glucose, capillary     Status: Abnormal   Collection Time: 03/12/19  7:54 AM  Result Value Ref Range   Glucose-Capillary 121 (H) 70 - 99 mg/dL  Glucose, capillary     Status: Abnormal   Collection Time: 03/12/19 11:53 AM  Result Value Ref Range   Glucose-Capillary 104 (H) 70 - 99 mg/dL  CBC     Status: Abnormal   Collection Time: 03/12/19 12:27 PM  Result Value Ref Range   WBC 13.1 (H) 4.0 - 10.5 K/uL   RBC 3.01 (L) 4.22 - 5.81 MIL/uL   Hemoglobin 8.7 (L) 13.0 - 17.0 g/dL   HCT 27.1 (L) 39.0 - 52.0 %   MCV 90.0 80.0 - 100.0 fL   MCH 28.9 26.0 - 34.0 pg   MCHC 32.1 30.0 - 36.0 g/dL   RDW 16.5 (H) 11.5 - 15.5 %   Platelets 135 (L) 150 - 400 K/uL   nRBC 0.0 0.0 - 0.2 %    Comment: Performed at [facility], [address], [place], [place] [address]  Basic metabolic panel     Status: Abnormal   Collection Time: 03/12/19 12:27  PM  Result Value Ref Range   Sodium 137 135 - 145 mmol/L   Potassium 4.3 3.5 - 5.1 mmol/L   Chloride 102 98 - 111 mmol/L   CO2 26 22 - 32 mmol/L   Glucose, Bld 102 (H) 70 - 99 mg/dL   BUN 19 8 - 23 mg/dL   Creatinine, Ser 0.81 0.61 - 1.24 mg/dL   Calcium 8.1 (L) 8.9 - 10.3 mg/dL   GFR calc non Af Amer >60 >60 mL/min   GFR calc Af Amer >60 >60 mL/min   Anion gap 9 5 - 15    Comment: Performed at [facility] [address], [place], [place] [address]  Glucose, capillary     Status: Abnormal   Collection Time: 03/12/19  4:49 PM  Result Value Ref Range   Glucose-Capillary 117 (H) 70 - 99 mg/dL  Glucose, capillary     Status: Abnormal   Collection Time: 03/12/19 10:23 PM  Result Value Ref Range   Glucose-Capillary 121 (H) 70 - 99 mg/dL   Comment 1 Document in Chart   AM CBC     Status: Abnormal   Collection Time: 03/13/19  3:12 AM  Result Value Ref Range   WBC 10.5 4.0 - 10.5 K/uL    RBC 2.92 (L) 4.22 - 5.81 MIL/uL   Hemoglobin 8.4 (L) 13.0 - 17.0 g/dL   HCT 26.9 (L) 39.0 - 52.0 %   MCV 92.1 80.0 - 100.0 fL   MCH 28.8 26.0 - 34.0 pg   MCHC 31.2 30.0 - 36.0 g/dL   RDW 16.3 (H) 11.5 - 15.5 %   Platelets 136 (L) 150 - 400 K/uL   nRBC 0.0 0.0 - 0.2 %    Comment: Performed at [facility], [place] [address], [place], [place] [address]  Basic metabolic panel     Status: Abnormal   Collection Time: 03/13/19  7:39 AM  Result Value Ref Range   Sodium 136 135 - 145 mmol/L   Potassium 3.9 3.5 - 5.1 mmol/L   Chloride 99 98 - 111 mmol/L   CO2 27 22 - 32 mmol/L   Glucose, Bld 92 70 - 99 mg/dL   BUN 19 8 - 23 mg/dL   Creatinine, Ser 0.83 0.61 - 1.24 mg/dL   Calcium 8.3 (L) 8.9 - 10.3 mg/dL   GFR calc non Af Amer >60 >60 mL/min   GFR calc Af Amer >60 >60 mL/min   Anion gap 10 5 - 15    Comment: Performed at [facility] [address] [place], [place] [address]  Magnesium     Status: None   Collection Time: 03/13/19  7:39 AM  Result Value Ref Range   Magnesium 2.2 1.7 - 2.4 mg/dL    Comment: Performed at [facility] [address] [place], [place] [address]  Glucose, capillary     Status: None   Collection Time: 03/13/19  7:58 AM  Result Value Ref Range   Glucose-Capillary 96 70 - 99 mg/dL  Glucose, capillary     Status: None   Collection Time: 03/13/19 12:14 PM  Result Value Ref Range   Glucose-Capillary 86 70 - 99 mg/dL   No results found.     Medical Problem List and Plan: 1.  Debility secondary to GI bleed/hypovolemic/hemorrhagic shock/acute hypoxic respiratory failure.  Self extubated 03/06/2019  -patient may  shower  -ELOS/Goals: 14 to 17 days with goals of mod I for mobility using assistive  device, min to supervision for ADLs 2.  Antithrombotics: -DVT/anticoagulation: SCDs.  Check vascular study  -antiplatelet therapy: N/A 3. Pain Management/chronic pain management: Cymbalta 30 mg daily, Suboxone 2 tablets daily, Neurontin 800 mg  every 8 hours 4. Mood: Provide emotional support  -antipsychotic agents: N/A 5. Neuropsych: This patient is capable of making decisions on his own behalf. 6. Skin/Wound Care: Routine skin checks 7. Fluids/Electrolytes/Nutrition: Routine in and outs with follow-up chemistries 8.  Hypertension.  Lasix as directed, lisinopril 30 mg daily, Zaroxolyn 5 mg daily 9.  GI bleed.  Follow-up gastroenterology services.  Plan EGD follow-up 2 weeks.  Continue Protonix twice daily 10.  Morbid obesity.  BMI 42.60.  Follow-up chemistries 11.  Cirrhosis/hepatic steatosis.  Follow-up hepatic panel 12.  Thrombocytopenia/anemia.  Felt to be related to cirrhosis.  Follow-up CBC 13.  Hyperlipidemia.  Pravachol  "I have personally performed a face to face diagnostic evaluation of this patient.  Additionally, I have reviewed and concur with the physician assistant's documentation above."  [name], PA-C 03/13/2019  Charlett Blake M.D. [facility] FAAPM&R (Sports Med, Neuromuscular Med) Diplomate Am Board of Electrodiagnostic Med

## 2019-03-13 NOTE — Plan of Care (Signed)
  Problem: Consults Goal: RH GENERAL PATIENT EDUCATION Description: See Patient Education module for education specifics. Outcome: Progressing   Problem: RH BOWEL ELIMINATION Goal: RH STG MANAGE BOWEL WITH ASSISTANCE Description: STG Manage Bowel with Assistance. Outcome: Progressing Goal: RH STG MANAGE BOWEL W/MEDICATION W/ASSISTANCE Description: STG Manage Bowel with Medication with Assistance. Outcome: Progressing   Problem: RH BLADDER ELIMINATION Goal: RH STG MANAGE BLADDER WITH ASSISTANCE Description: STG Manage Bladder With Assistance Outcome: Progressing Goal: RH STG MANAGE BLADDER WITH EQUIPMENT WITH ASSISTANCE Description: STG Manage Bladder With Equipment With Assistance Outcome: Progressing   Problem: RH SKIN INTEGRITY Goal: RH STG SKIN FREE OF INFECTION/BREAKDOWN Outcome: Progressing Goal: RH STG MAINTAIN SKIN INTEGRITY WITH ASSISTANCE Description: STG Maintain Skin Integrity With Assistance. Outcome: Progressing   Problem: RH SAFETY Goal: RH STG ADHERE TO SAFETY PRECAUTIONS W/ASSISTANCE/DEVICE Description: STG Adhere to Safety Precautions With Assistance/Device. Outcome: Progressing

## 2019-03-13 NOTE — PMR Pre-admission (Addendum)
PMR Admission Coordinator Pre-Admission Assessment  Patient: Charles Gray is an 63 y.o., male MRN: [id number] DOB: 06/29/1955 Height: 6' 10"  (208.3 cm) Weight: (!) 184.8 kg  Insurance Information HMO:     PPO:      PCP:      IPA:      80/20:      OTHER:  PRIMARY: Medicare      Policy#: [id number]      Subscriber: pt Benefits:  Phone #: passport one online     Name: 11/30 Eff. Date: 12/13/1994     Deduct: $1408      Out of Pocket Max: none      Life Max: none CIR: 100%      SNF: 20 full days Outpatient: 80%     Co-Pay: 20% Home Health: 100%      Co-Pay: none DME: 80%     Co-Pay: 20% Providers: pt choice  SECONDARY: Medicaid of [place]      Policy#: [id number] k Active 11/30 MADCY      Medicaid Application Date:       Case Manager:  Disability Application Date:       Case Worker:   The "Data Collection Information Summary" for patients in Inpatient Rehabilitation Facilities with attached "Privacy Act [place] Records" was provided and verbally reviewed with: Patient  Emergency Contact Information Contact Information    Name Relation Home Work Mobile   Carmichaels Son   [phone]      Current Medical History  Patient Admitting Diagnosis: Debility  History of Present Illness:63 year old right-handed male with history of hyperlipidemia, asthma, cirrhosis, hypertension, herniated disc maintained on suboxone 2 tablets daily followed by pain management, morbid obesity BMI 42.60.  Presented 03/02/2019 to [facility] with increasing shortness of breath leg edema.  During his hospital course acute drop in blood pressure as well as hemoglobin with melanotic stool.  He had increasing leukocytosis and lactic acidosis but was afebrile.  Placed on broad-spectrum antibiotics.  CT of abdomen pelvis and abdominal CTA without obvious source of bleeding.  Hemoglobin did drop from 14-7.5.  He was given 3 units packed red blood cells placed on Protonix and transferred to [facility] 03/04/2019 for further evaluation.  Patient did require intubation for airway protection.  Underwent upper GI endoscopy 03/05/2019 per gastroenterology Dr. Loletha Carrow with no varices seen.  A large amount of fresh and clotted blood was found in the gastric fundus and in the proximal gastric body.  Normal esophagus.  Very mild portal hypertensive gastropathy.  There was a chain of lesions on a fold in the distal gastric fundus with purple discoloration.  Patient self extubated 03/06/2019.  Patient with initial bouts of confusion and agitation cranial CT scan 03/07/2019  Negative.  Hemoglobin is stabilized 8.4.  Recommendations are currently to repeat EGD in 2 months and continue Protonix twice daily.  He is tolerating a mechanical soft diet.  Noted thrombocytopenia improved to 111-136 felt to be reactive versus cirrhosis.    Patient's medical record from [facility]  has been reviewed by the rehabilitation admission coordinator and physician.  Past Medical History  Past Medical History:  Diagnosis Date  . Fatty liver disease, nonalcoholic   . Hyperlipidemia 01/24/2019  . Hypertension 01/24/2019  . Insomnia 01/24/2019  . Lumbar disc herniation 01/24/2019    Family History   family history includes CAD in his maternal grandfather and maternal grandmother; Hyperlipidemia in his mother; Hypertension in his mother; Stroke in his mother.  Prior Rehab/Hospitalizations  Has the patient had prior rehab or hospitalizations prior to admission? Yes  Has the patient had major surgery during 100 days prior to admission? Yes   Current Medications  Current Facility-Administered Medications:  .  0.9 %  sodium chloride infusion, , Intravenous, PRN, Harold Hedge, MD, Stopped at 03/09/19 2252 .  acetaminophen (TYLENOL) tablet 650 mg, 650 mg, Oral, Q4H PRN, Harold Hedge, MD, 650 mg at 03/13/19 0902 .  albuterol (PROVENTIL) (2.5 MG/3ML) 0.083% nebulizer solution 2.5 mg, 2.5 mg, Inhalation, Q6H  PRN, Harold Hedge, MD .  buprenorphine-naloxone (SUBOXONE) 8-2 mg per SL tablet 2 tablet, 2 tablet, Sublingual, Daily, Harold Hedge, MD, 2 tablet at 03/13/19 0857 .  chlorhexidine (PERIDEX) 0.12 % solution 15 mL, 15 mL, Mouth Rinse, BID, Harold Hedge, MD, 15 mL at 03/13/19 0858 .  Chlorhexidine Gluconate Cloth 2 % PADS 6 each, 6 each, Topical, Q0600, Harold Hedge, MD, 6 each at 03/12/19 1213 .  DULoxetine (CYMBALTA) DR capsule 30 mg, 30 mg, Oral, Daily, Harold Hedge, MD, 30 mg at 03/13/19 0858 .  feeding supplement (PRO-STAT SUGAR FREE 64) liquid 30 mL, 30 mL, Per Tube, BID, Harold Hedge, MD, 30 mL at 03/13/19 0858 .  furosemide (LASIX) tablet 40 mg, 40 mg, Oral, q1800, Harold Hedge, MD, 40 mg at 03/12/19 1843 .  furosemide (LASIX) tablet 80 mg, 80 mg, Oral, Daily, Harold Hedge, MD, 80 mg at 03/13/19 0857 .  gabapentin (NEURONTIN) 250 MG/5ML solution 800 mg, 800 mg, Oral, Q8H, Marva Panda E, MD, 800 mg at 03/13/19 3532 .  haloperidol lactate (HALDOL) injection 5 mg, 5 mg, Intravenous, Q6H PRN, Harold Hedge, MD, 5 mg at 03/07/19 2104 .  insulin aspart (novoLOG) injection 0-15 Units, 0-15 Units, Subcutaneous, TID WC, Harold Hedge, MD, 2 Units at 03/12/19 0900 .  insulin aspart (novoLOG) injection 0-5 Units, 0-5 Units, Subcutaneous, QHS, Marva Panda E, MD .  lisinopril (ZESTRIL) tablet 30 mg, 30 mg, Per Tube, Daily, Harold Hedge, MD, 30 mg at 03/13/19 0857 .  MEDLINE mouth rinse, 15 mL, Mouth Rinse, q12n4p, Harold Hedge, MD, 15 mL at 03/10/19 1728 .  metolazone (ZAROXOLYN) tablet 5 mg, 5 mg, Oral, Daily, Harold Hedge, MD, 5 mg at 03/12/19 1843 .  ondansetron (ZOFRAN) injection 4 mg, 4 mg, Intravenous, Q6H PRN, Harold Hedge, MD .  pantoprazole (PROTONIX) EC tablet 40 mg, 40 mg, Oral, BID, Harold Hedge, MD, 40 mg at 03/13/19 0857 .  phenol (CHLORASEPTIC) mouth spray 1 spray, 1 spray, Mouth/Throat, PRN, Harold Hedge, MD, 1 spray at 03/09/19 0840 .  pneumococcal 23 valent  vaccine (PNEUMOVAX-23) injection 0.5 mL, 0.5 mL, Intramuscular, Tomorrow-1000, Segal, Jared E, MD .  polyethylene glycol (MIRALAX / GLYCOLAX) packet 17 g, 17 g, Oral, Daily PRN, Harold Hedge, MD, 17 g at 03/12/19 0858 .  pravastatin (PRAVACHOL) tablet 20 mg, 20 mg, Oral, Daily, Harold Hedge, MD, 20 mg at 03/13/19 0857 .  senna-docusate (Senokot-S) tablet 1 tablet, 1 tablet, Oral, BID, Harold Hedge, MD, 1 tablet at 03/12/19 2111 .  zolpidem (AMBIEN) tablet 10 mg, 10 mg, Oral, QHS PRN, Harold Hedge, MD, 10 mg at 03/12/19 2118  Patients Current Diet:  Diet Order            Diet - low sodium heart healthy        DIET DYS 3 Room service appropriate? Yes; Fluid consistency: Thin  Diet effective  now              Precautions / Restrictions Precautions Precautions: Fall Precaution Comments: monitor SpO2 with activity Restrictions Weight Bearing Restrictions: No   Has the patient had 2 or more falls or a fall with injury in the past year? No  Prior Activity Level Limited Community (1-2x/wk): Does not drive; no car  Prior Functional Level Self Care: Did the patient need help bathing, dressing, using the toilet or eating? Independent  Indoor Mobility: Did the patient need assistance with walking from room to room (with or without device)? Independent  Stairs: Did the patient need assistance with internal or external stairs (with or without device)? Independent  Functional Cognition: Did the patient need help planning regular tasks such as shopping or remembering to take medications? Independent  Home Assistive Devices / Equipment Home Equipment: None  Prior Device Use: Indicate devices/aids used by the patient prior to current illness, exacerbation or injury? None of the above  Current Functional Level Cognition  Overall Cognitive Status: Within Functional Limits for tasks assessed Orientation Level: Oriented X4    Extremity Assessment (includes Sensation/Coordination)   Upper Extremity Assessment: Generalized weakness  Lower Extremity Assessment: Generalized weakness    ADLs  Overall ADL's : Needs assistance/impaired Eating/Feeding: Independent, Sitting Grooming: Set up, Sitting Upper Body Bathing: Set up, Sitting Lower Body Bathing: Moderate assistance Lower Body Bathing Details (indicate cue type and reason): Mod A +2 sit<>stand Upper Body Dressing : Set up, Sitting Lower Body Dressing: Maximal assistance Lower Body Dressing Details (indicate cue type and reason): Mod A +2 sit<>stand Toilet Transfer: Moderate assistance, Ambulation, BSC, Requires wide/bariatric Toileting- Clothing Manipulation and Hygiene: Total assistance Toileting - Clothing Manipulation Details (indicate cue type and reason): Mod A +2 sit<>stand    Mobility  Overal bed mobility: Needs Assistance Bed Mobility: Supine to Sit, Sit to Supine Supine to sit: HOB elevated, Mod assist General bed mobility comments: sitting in recliner chair when OT entered the room    Transfers  Overall transfer level: Needs assistance Equipment used: Rolling walker (2 wheeled) Transfers: Sit to/from Stand Sit to Stand: Min assist, From elevated surface Stand pivot transfers: Min assist General transfer comment: Min from elevated surface, Pt performed x 3 sit<>stands throughout session in order to pull pants over hips and for gait    Ambulation / Gait / Stairs / Wheelchair Mobility  Ambulation/Gait Ambulation/Gait assistance: Min assist, +2 safety/equipment Gait Distance (Feet): 12 Feet(+14 ft) Assistive device: Rolling walker (2 wheeled) Gait Pattern/deviations: Step-to pattern, Decreased step length - right, Decreased step length - left, Shuffle, Trunk flexed, Wide base of support General Gait Details: Pt with slow shuffling pattern with poor foot clearance Bilat.  Pt required cues for increasing step length/height and improving upright posture. Gait velocity: decr Gait velocity interpretation:  <1.31 ft/sec, indicative of household ambulator    Posture / Balance Dynamic Sitting Balance Sitting balance - Comments: dynamic stting balance with supervision while looping LEs through pants Balance Overall balance assessment: Needs assistance Sitting-balance support: No upper extremity supported Sitting balance-Leahy Scale: Good Sitting balance - Comments: dynamic stting balance with supervision while looping LEs through pants Standing balance support: Bilateral upper extremity supported Standing balance-Leahy Scale: Poor Standing balance comment: reliant on UE support with RW for balance    Special needs/care consideration BiPAP/CPAP  CPM  Continuous Drip IV  Dialysis         Days  Life Vest  Oxygen  Special Bed  Trach Size  Wound Vac (  area)       Location  Skin dry cracking skin to feet Bilaterally; MASD to buttocks and groin Bowel mgmt: continent LBM 11/29 Bladder mgmt: continent Diabetic mgmt:  Behavioral consideration  Chemo/radiation  Visitor is son, [name]   Previous Home Environment  Living Arrangements: Alone  Lives With: Alone Available Help at Discharge: Family, Available PRN/intermittently Type of Home: (second floor) Home Layout: One level Home Access: Stairs to enter Entrance Stairs-Rails: Left, Right Entrance Stairs-Number of Steps: 18 steps Bathroom Shower/Tub: Tub/shower unit, Industrial/product designer: Yes How Accessible: Accessible via walker Home Care Services: No Additional Comments: (can go stay with son on one level, but he does not want to)  Discharge Living Setting Plans for Discharge Living Setting: Alone, Apartment Type of Home at Discharge: Apartment(second level apartment) Discharge Home Layout: One level Discharge Home Access: Stairs to enter Entrance Stairs-Rails: Right, Left Entrance Stairs-Number of Steps: 18 Discharge Bathroom Shower/Tub: Tub/shower unit, Curtain Discharge Bathroom Toilet:  Standard Discharge Bathroom Accessibility: Yes How Accessible: Accessible via walker Does the patient have any problems obtaining your medications?: No  Social/Family/Support Systems Patient Roles: Parent Contact Information: son, [name] Anticipated Caregiver: son prn only; lives 3 miles away Anticipated Caregiver's Contact Information: see above Ability/Limitations of Caregiver: son has his own family and job Caregiver Availability: Intermittent Discharge Plan Discussed with Primary Caregiver: Yes Is Caregiver In Agreement with Plan?: Yes Does Caregiver/Family have Issues with Lodging/Transportation while Pt is in Rehab?: No  Goals/Additional Needs Patient/Family Goal for Rehab: Mod I with PT  and OT Expected length of stay: ELOS 7 to 10 days Equipment Needs: Bariatric DME; Pt refuses big boy bed; he is claustrophobic Pt/Family Agrees to Admission and willing to participate: Yes Program Orientation Provided & Reviewed with Pt/Caregiver Including Roles  & Responsibilities: Yes  Barriers to Discharge: Weight, Other (comments)(suboxone daily)  Decrease burden of Care through IP rehab admission: n/a  Possible need for SNF placement upon discharge: pt refuses  Patient Condition: I have reviewed medical records from [facility] , spoken with CM, and patient. I met with patient at the bedside for inpatient rehabilitation assessment.  Patient will benefit from ongoing PT and OT, can actively participate in 3 hours of therapy a day 5 days of the week, and can make measurable gains during the admission.  Patient will also benefit from the coordinated team approach during an Inpatient Acute Rehabilitation admission.  The patient will receive intensive therapy as well as Rehabilitation physician, nursing, social worker, and care management interventions.  Due to bladder management, bowel management, safety, skin/wound care, disease management, medication administration, pain management and  patient education the patient requires 24 hour a day rehabilitation nursing.  The patient is currently min assist with mobility and basic ADLs.  Discharge setting and therapy post discharge at home with home health is anticipated.  Patient has agreed to participate in the Acute Inpatient Rehabilitation Program and will admit today.  Preadmission Screen Completed By:  Cleatrice Burke, 03/13/2019 1:19 PM ______________________________________________________________________   Discussed status with Dr. Letta Pate on  03/13/2019 at 1329 and received approval for admission today.  Admission Coordinator:  Cleatrice Burke, RN, time 1610 Date 03/13/2019   Assessment/Plan: 1. Diagnosis:Debility due to GI bleed Does the need for close, 24 hr/day Medical supervision in concert with the patient's rehab needs make it unreasonable for this patient to be served in a less intensive setting? Yes 2. Co-Morbidities requiring supervision/potential complications: CHF, NAFLD 3. Due to bladder  management, bowel management, safety, skin/wound care, disease management, medication administration, pain management and patient education, does the patient require 24 hr/day rehab nursing? Yes 4. Does the patient require coordinated care of a physician, rehab nurse, PT, OT,  to address physical and functional deficits in the context of the above medical diagnosis(es)? Yes Addressing deficits in the following areas: balance, endurance, locomotion, strength, transferring, bowel/bladder control, bathing, dressing, grooming, toileting and psychosocial support 5. Can the patient actively participate in an intensive therapy program of at least 3 hrs of therapy 5 days a week? Yes 6. The potential for patient to make measurable gains while on inpatient rehab is good 7. Anticipated functional outcomes upon discharge from inpatient rehab: modified independent PT, supervision and min assist OT, n/a SLP 8. Estimated rehab  length of stay to reach the above functional goals is: 14-17d 9. Anticipated discharge destination: Home 10. Overall Rehab/Functional Prognosis: good   MD Signature: "I have personally performed a face to face diagnostic evaluation of this patient.    Charlett Blake M.D. [facility] FAAPM&R (Sports Med, Neuromuscular Med) Diplomate Am Board of Electrodiagnostic Med

## 2019-03-13 NOTE — Progress Notes (Signed)
Physical Therapy Treatment Patient Details Name: Charles Gray MRN: [id number] DOB: 1955/10/30 Today's Date: 03/13/2019    History of Present Illness 63 y.o. M with PMH of HTN, HL, Asthma and Cirrhosis, DDD who presented to [facility] on 03/02/19 for worsening dyspnea and leg edema and was treated with Lasix.  During his hospital course he had acutely dropping BP and Hgb with melanotic stool.  He also had increasing leukocytosis and lactic acidosis, but was afebrile and treated with Vanc, Aztreonam and Levaquin.   He had a CT abdomen/pelvis and abdominal CTA without obvious source of bleeding.  Hgb 14 on admission, dropped to 7.5.  He was given 3 units PRBC's and started on protonix and transfer requested to [facility].  Pt.has a past medical history of Fatty liver disease, nonalcoholic, Hyperlipidemia, Hypertension, Insomnia, and Lumbar disc herniation    PT Comments    Pt progressing with functional mobility however continues to be limited by decreased cardiovascular endurance with mobility and ambulation, requiring seated rest breaks and only able to tolerate short distance ambulation at this time. Pt performed sit<>stands throughout session from EOB and recliner with RW and min assist, increased work of breathing and mild drop in SpO2 to 90%. Pt ambulated 10-12 ft with RW and min assist, with increased work of breathing and mild drop in SpO2 to 86% while on room air. Discussed Inpatient Rehab with patient in order to increase activity tolerance and strength, as therapist believes patient can get to Mod I level. Continue therapy services acutely in order to address these impairments in mobility. Continue to recommend CIR for intensive therapies in order to work on cardiovascular endurance, activity tolerance, increased ambulation distance and ability to navigate steps.     Follow Up Recommendations  Supervision/Assistance - 24 hour;CIR     Equipment Recommendations  Rolling walker  with 5" wheels    Recommendations for Other Services Rehab consult     Precautions / Restrictions Precautions Precautions: Fall Precaution Comments: monitor SpO2 with activity Restrictions Weight Bearing Restrictions: No    Mobility  Bed Mobility               General bed mobility comments: pt sitting EOB upon PT arrival  Transfers Overall transfer level: Needs assistance Equipment used: Rolling walker (2 wheeled) Transfers: Sit to/from Stand Sit to Stand: Min assist;From elevated surface Stand pivot transfers: Min assist       General transfer comment: Min from elevated surface, Pt performed x 3 sit<>stands throughout session in order to pull pants over hips and for gait  Ambulation/Gait Ambulation/Gait assistance: Min assist;+2 safety/equipment Gait Distance (Feet): 12 Feet(+14 ft) Assistive device: Rolling walker (2 wheeled) Gait Pattern/deviations: Step-to pattern;Decreased step length - right;Decreased step length - left;Shuffle;Trunk flexed;Wide base of support Gait velocity: decr Gait velocity interpretation: <1.31 ft/sec, indicative of household ambulator General Gait Details: Pt with slow shuffling pattern with poor foot clearance Bilat.  Pt required cues for increasing step length/height and improving upright posture.   Stairs             Wheelchair Mobility    Modified Rankin (Stroke Patients Only)       Balance Overall balance assessment: Needs assistance Sitting-balance support: No upper extremity supported Sitting balance-Leahy Scale: Good Sitting balance - Comments: dynamic stting balance with supervision while looping LEs through pants   Standing balance support: Bilateral upper extremity supported Standing balance-Leahy Scale: Poor Standing balance comment: reliant on UE support with RW for balance  Cognition Arousal/Alertness: Awake/alert Behavior During Therapy: WFL for tasks  assessed/performed Overall Cognitive Status: Within Functional Limits for tasks assessed                                        Exercises      General Comments General comments (skin integrity, edema, etc.): On room air throughout session- At rest: SpO2 98%, HR 90 bpm, RR 15. With activity- increased work of breathing, SpO2 86%, HR 101 bpm and RR 22      Pertinent Vitals/Pain Pain Assessment: No/denies pain    Home Living                      Prior Function            PT Goals (current goals can now be found in the care plan section) Progress towards PT goals: Progressing toward goals    Frequency    Min 3X/week      PT Plan Current plan remains appropriate    Co-evaluation              AM-PAC PT "6 Clicks" Mobility   Outcome Measure  Help needed turning from your back to your side while in a flat bed without using bedrails?: A Lot Help needed moving from lying on your back to sitting on the side of a flat bed without using bedrails?: A Lot Help needed moving to and from a bed to a chair (including a wheelchair)?: A Little Help needed standing up from a chair using your arms (e.g., wheelchair or bedside chair)?: A Little Help needed to walk in hospital room?: A Little Help needed climbing 3-5 steps with a railing? : A Lot 6 Click Score: 15    End of Session Equipment Utilized During Treatment: Gait belt Activity Tolerance: Patient tolerated treatment well Patient left: in chair;with call bell/phone within reach Nurse Communication: Mobility status PT Visit Diagnosis: Other abnormalities of gait and mobility (R26.89);Difficulty in walking, not elsewhere classified (R26.2);Muscle weakness (generalized) (M62.81)     Time: 0826-0900 PT Time Calculation (min) (ACUTE ONLY): 34 min  Charges:  $Gait Training: 8-22 mins $Therapeutic Activity: 8-22 mins                     Netta Corrigan, PT, DPT, CSRS Acute Rehab Office  [place] 03/13/2019, 11:17 AM

## 2019-03-13 NOTE — Progress Notes (Signed)
   03/13/19 0000  Charting Type  Charting Type Reassessment  Orders Chart Check (once per shift) Completed  Neurological  Neuro (WDL) X  Level of Consciousness Alert  Orientation Level Oriented X4  Cognition Poor attention/concentration;Poor judgement;Poor safety awareness;Follows commands;Memory impairment (Patient doesn't remember taking HS meds.)  Speech Clear  Pupil Assessment  No  Neuro Symptoms Drowsiness;Fatigue;Forgetful;Anxiety  Glasgow Coma Scale  Eye Opening 4  Best Verbal Response (NON-intubated) 5  Best Motor Response 6  Glasgow Coma Scale Score 15  Respiratory  Respiratory (WDL) X  Cough None  Respiratory Pattern Regular;Unlabored;Dyspnea with exertion  Chest Assessment Chest expansion symmetrical  R Upper  Breath Sounds Clear  L Upper Breath Sounds Clear  R Lower Breath Sounds Diminished;Clear  L Lower Breath Sounds Diminished;Clear  Cardiac  Cardiac (WDL) WDL  ECG Monitor Yes  ECG Monitoring  ECG Heart Rate 76  Gastrointestinal  Gastrointestinal (WDL) X  Abdomen Inspection Soft;Obese  Bowel Sounds Assessment Active  Tenderness Nontender  Last BM Date 03/12/19  Passing Flatus Yes  GI Symptoms Bloating  Urine Characteristics  Urinary Incontinence No   Patient very fatigued and drowsy but anxious. Patient was sleeping but woke up and wanted in the chair. Patient was falling asleep in the chair, but did not want to go back to bed, despite this RN and NT verbalizing concern for safety. After patient observed staying asleep in the chair, he eventually agreed to get back to bed with assistance. Patient then woke up after a nightmare, so he was sitting in bed instead of resting back. Will continue to monitor.

## 2019-03-13 NOTE — Progress Notes (Addendum)
Inpatient Rehabilitation Admissions Coordinator  Inpatient rehab consult received. I met with pt at bedside for rehab assessment. We discussed goals and expectations of an  inpt rehab admit. Pt is in agreement. I will contact Dr. Neysa Bonito to clarify if can d/c to CIR today.  Danne Baxter, RN, MSN Rehab Admissions Coordinator [phone] 03/13/2019 12:10 PM   Dr. Neysa Bonito in agreement. I will make the arrangements to admit today.

## 2019-03-13 NOTE — Progress Notes (Signed)
Occupational Therapy Treatment Patient Details Name: Charles Gray MRN: [id number] DOB: 07-26-1955 Today's Date: 03/13/2019    History of present illness 63 y.o. M with PMH of HTN, HL, Asthma and Cirrhosis, DDD who presented to [facility] on 03/02/19 for worsening dyspnea and leg edema and was treated with Lasix.  During his hospital course he had acutely dropping BP and Hgb with melanotic stool.  He also had increasing leukocytosis and lactic acidosis, but was afebrile and treated with Vanc, Aztreonam and Levaquin.   He had a CT abdomen/pelvis and abdominal CTA without obvious source of bleeding.  Hgb 14 on admission, dropped to 7.5.  He was given 3 units PRBC's and started on protonix and transfer requested to [facility].  Pt.has a past medical history of Fatty liver disease, nonalcoholic, Hyperlipidemia, Hypertension, Insomnia, and Lumbar disc herniation   OT comments  Pt seated in recliner chair and agreeable to OTR intervention. Pt making progress towards goals this session. Pt able to engage in B UE strengthening exercises with level 2 resistive theraband while on RA with O2 saturation only dropping once to 86% and returning in less than 1 minute with cuing for pursed lip breathing. Pt performing 2 sets of 10 chest pulls, shoulder diagonals, bicep curls, and alternating punches with min cuing for proper technique and breathing exercises. Multiple rest breaks secondary to fatigue.   Follow Up Recommendations  CIR    Equipment Recommendations  (defer to next venue of care)       Precautions / Restrictions Precautions Precautions: Fall Precaution Comments: monitor SpO2 with activity Restrictions Weight Bearing Restrictions: No       Mobility Bed Mobility     General bed mobility comments: sitting in recliner chair when OT entered the room  Transfers Overall transfer level: Needs assistance Equipment used: Rolling walker (2 wheeled) Transfers: Sit to/from Stand Sit to  Stand: Min assist;From elevated surface Stand pivot transfers: Min assist       General transfer comment: Min from elevated surface, Pt performed x 3 sit<>stands throughout session in order to pull pants over hips and for gait    Balance Overall balance assessment: Needs assistance Sitting-balance support: No upper extremity supported Sitting balance-Leahy Scale: Good Sitting balance - Comments: dynamic stting balance with supervision while looping LEs through pants   Standing balance support: Bilateral upper extremity supported Standing balance-Leahy Scale: Poor Standing balance comment: reliant on UE support with RW for balance          ADL either performed or assessed with clinical judgement     Cognition Arousal/Alertness: Awake/alert Behavior During Therapy: WFL for tasks assessed/performed Overall Cognitive Status: Within Functional Limits for tasks assessed                 General Comments On room air throughout session- At rest: SpO2 98%, HR 90 bpm, RR 15. With activity- increased work of breathing, SpO2 86%, HR 101 bpm and RR 22    Pertinent Vitals/ Pain       Pain Assessment: No/denies pain         Frequency  Min 2X/week        Progress Toward Goals  OT Goals(current goals can now be found in the care plan section)  Progress towards OT goals: Progressing toward goals  Acute Rehab OT Goals Patient Stated Goal: to go to rehab OT Goal Formulation: With patient Time For Goal Achievement: 03/27/19 Potential to Achieve Goals: Good  Plan  AM-PAC OT "6 Clicks" Daily Activity     Outcome Measure   Help from another person eating meals?: None Help from another person taking care of personal grooming?: A Little Help from another person toileting, which includes using toliet, bedpan, or urinal?: A Lot Help from another person bathing (including washing, rinsing, drying)?: A Lot Help from another person to put on and taking off regular upper  body clothing?: A Little Help from another person to put on and taking off regular lower body clothing?: A Lot 6 Click Score: 16    End of Session Equipment Utilized During Treatment: Gait belt;Rolling walker  OT Visit Diagnosis: Unsteadiness on feet (R26.81);Other abnormalities of gait and mobility (R26.89);Muscle weakness (generalized) (M62.81);Pain   Activity Tolerance Patient tolerated treatment well;Other (comment)   Patient Left in chair;with call bell/phone within reach   Nurse Communication Mobility status        Time: 8592-7639 OT Time Calculation (min): 16 min  Charges: OT General Charges $OT Visit: 1 Visit OT Treatments $Therapeutic Exercise: 8-22 mins   Gypsy Decant MS, OTR/L 03/13/2019, 12:38 PM

## 2019-03-13 NOTE — Discharge Summary (Signed)
Physician Discharge Summary  Charles Gray VQQ:[id number] DOB: 31-Aug-1955 DOA: 03/04/2019  PCP: Enid Skeens., MD  Admit date: 03/04/2019 Discharge date: 03/13/2019   Code Status: Full Code  Admitted From: Home Discharged to:  CIR Discharge Condition: Stable  Recommendations for Outpatient Follow-up   1. Follow up with PCP in 1 week 2. Please follow up BMP/CBC  3. Outpatient GI follow up in a few weeks with Dr. Lyda Jester in [place] or Dr. Loletha Carrow in [place].  4. Follow up with PCP and consider pain management for herniated lumbar disc pain. Advised on weight loss and PT once discharged from hospital 5. Started on Cymbalta for neuropathic pain and depression  Hospital Summary  63 year old male with history of hypertension, hyperlipidemia, asthma, cirrhosis, degenerative disc disease presented to [facility] on 03/02/2019 for worsening dyspnea and leg edema and was treated with Lasix. During the hospitalization, he became hypotensive with drop in hemoglobin and melanotic stool along with increasing leukocytosis and lactic acidosis. He was started on vancomycin, aztreonam and Levaquin. He had a CT abdomen/pelvis and abdominal CTA without obvious source of bleeding. Hemoglobin 14 on admission, dropped to 7.5. He was given 3 units PRBCs and started on Protonix and transferred to [facility] under PCCM service on 03/04/2019. He was intubated on 03/05/2019 and had EGD on the same day which showed gastric source of bleeding but unclear locus. He underwent EGD again on 03/06/2019 which showed no esophageal or gastric varices; very mild portal hypertensive gastropathy; small, nonbleeding gastric ulcer with no bleeding stigmata in an area of localized erythema suspicious for NG tube trauma. He self extubated on 03/06/2019. GI signed off on 03/07/2019 and recommended repeat EGD in 2 months and to continue twice a day Protonix. He was transferred to [facility] service on 03/09/2019.  Transferred to stepdown 11/27.  Patient continued to improve clinically however did have some black stools with minor drop in Hb. GI was reconsulted for possible rescope who did not recommend further intervention and recommended patient follow up outpatient in several weeks.   Patient discharged in stable condition to CIR 03/13/19  A & P   Active Problems:   GIB (gastrointestinal bleeding)   Shock (HCC)   Dyspnea   Acute respiratory failure with hypoxia (HCC)   Bilateral lower extremity paresthesias, likely from chronic herniated lumbar disc in setting of obesity No mri on file, patient reports long standing history of multiple lumbar herniated discs without surgical intervention or epidural injections in the past. Positive straight leg raise test.  -Consider MRI lumbar spine outpatient -consider epidural injection outpatient  -recommended PT outpatient and weight loss once he is discharged from CIR  -will start Cymbalta 30 mg x 1 week followed by 60 mg daily for depression and neuropathic pain  Hypovolemic/hemorrhagic shock, shock resolved Acute blood loss anemia secondary to suspected upper GI bleed Status post 3 units of transfusion of packed red cells prior to transfer to [facility].  Repeat Large dark BM today suspect persistent remnant from prior bleed and not rebleed, hemoglobin down to 9.6> 8.5>8.7>8.4.  Hemodynamically stable. GI reconsulted who recommended conservative management and outpatient follow up -continue to monitor   GI bleeding, suspect upper source -Had EGD on 03/05/2019 and on 03/06/2019. Small gastric ulcer noted however source was not clearly determined. Hemoglobin is stable. GI has signed off and recommend EGD in 2 months.  -Continue Protonix oral twice a day    Acute metabolic encephalopathy of unknown etiology Resolved. CT of the head  was negative for acute abnormality.  Acute hypoxic respiratory failure of unknown etiology Was  intubated prior to EGD. Self extubated on 03/06/2019. Completed 6 days Rocephin Resolved, tolerating room air  Volume overload EF 65 to 70% 03/05/2019 -Home Lasix and metolazone restarted   Leukocytosis resolved  Thrombocytopenia, reactive versus cirrhosis Improved, currently 111>136 -monitor outpatient  Acute kidney injury secondary to GI bleed and hypovolemia -Resolved  Hypokalemia -Resolved  Hepatic steatosis Reported history of cirrhosis and fatty liver -Outpatient follow-up with GI.   Hypertension Stable -Continue lisinopril. Monitor blood pressure.  Hyperlipidemia -Continue statin  Chronic pain -Home Suboxone has been restarted. Continue gabapentin.  -Outpatient follow-up with pain management  Generalized deconditioning - DC to CIR  Morbid obesity -Outpatient follow-up -recommended weight loss  Insomnia on Ambien outpatient -Continue Ambien    Consultants  . GI . PCCM  Procedures  Intubation: 03/05/2019-03/06/2019 03/05/2019 EGD.Gastric source of bleeding of unclear locus. MD spent greater than 2.5hours clearing stomach of fresh blood and food but did not find active bleeding. There was a chain of lesions on a fold in the distal gastric fundus with purple discoloration, 1 with a diminutive umbilicated spot (?change of deflated fundic varices?)  03/06/2019 EGD.No esophageal or gastric varices. Very mild portal hypertensive gastropathy. Small, nonbleeding gastric ulcer with no bleeding stigmata in an area of localized erythema suspiciousfor NG tube trauma.  Antibiotics   Anti-infectives (From admission, onward)   Start     Dose/Rate Route Frequency Ordered Stop   03/05/19 0030  cefTRIAXone (ROCEPHIN) 2 g in sodium chloride 0.9 % 100 mL IVPB     2 g 200 mL/hr over 30 Minutes Intravenous Daily at bedtime 03/05/19 0001 03/09/19 2322   03/05/19 0030  metroNIDAZOLE (FLAGYL) IVPB 500 mg  Status:  Discontinued     500  mg 100 mL/hr over 60 Minutes Intravenous Every 8 hours 03/05/19 0001 03/07/19 0846        Subjective   Patient seen and examined sitting upright in chair. No distress. Anticipating discharge to CIR. States his lower extremity edema is stable. No other complaints.    Objective   Discharge Exam: Vitals:   03/13/19 0642 03/13/19 0800  BP: 98/61 114/72  Pulse: 73 75  Resp: 15 18  Temp:  98.1 F (36.7 C)  SpO2: 95% 95%   Vitals:   03/13/19 0400 03/13/19 0508 03/13/19 0642 03/13/19 0800  BP: (!) 112/57 112/63 98/61 114/72  Pulse: 65 62 73 75  Resp: 13 12 15 18   Temp: 97.6 F (36.4 C)   98.1 F (36.7 C)  TempSrc: Oral   Oral  SpO2: 94% 93% 95% 95%  Weight:   (!) 184.8 kg   Height:        Physical Exam Vitals signs and nursing note reviewed.  Constitutional:      Appearance: He is obese.  HENT:     Head: Normocephalic and atraumatic.     Nose: Nose normal.     Mouth/Throat:     Mouth: Mucous membranes are moist.  Eyes:     Extraocular Movements: Extraocular movements intact.  Neck:     Musculoskeletal: Normal range of motion. No neck rigidity.  Cardiovascular:     Rate and Rhythm: Normal rate and regular rhythm.  Pulmonary:     Effort: Pulmonary effort is normal.     Breath sounds: Normal breath sounds.  Abdominal:     General: Abdomen is flat. There is no distension.     Palpations: Abdomen is  soft.     Tenderness: There is no abdominal tenderness.  Musculoskeletal:     Right lower leg: Edema present.     Left lower leg: Edema present.     Comments: 2-3+bilateral lower extremity edema  Neurological:     General: No focal deficit present.     Mental Status: He is alert. Mental status is at baseline.  Psychiatric:        Mood and Affect: Mood normal.        Behavior: Behavior normal.       The results of significant diagnostics from this hospitalization (including imaging, microbiology, ancillary and laboratory) are listed below for reference.      Microbiology: Recent Results (from the past 240 hour(s))  MRSA PCR Screening     Status: None   Collection Time: 03/04/19 10:39 PM   Specimen: Nasal Mucosa; Nasopharyngeal  Result Value Ref Range Status   MRSA by PCR NEGATIVE NEGATIVE Final    Comment:        The GeneXpert MRSA Assay (FDA approved for NASAL specimens only), is one component of a comprehensive MRSA colonization surveillance program. It is not intended to diagnose MRSA infection nor to guide or monitor treatment for MRSA infections. Performed at [facility], [place] [address], [place], [place] [address]   Culture, blood (routine x 2)     Status: None   Collection Time: 03/04/19 11:45 PM   Specimen: BLOOD RIGHT HAND  Result Value Ref Range Status   Specimen Description BLOOD RIGHT HAND  Final   Special Requests   Final    BOTTLES DRAWN AEROBIC AND ANAEROBIC Blood Culture adequate volume   Culture   Final    NO GROWTH 5 DAYS Performed at [facility], [place] [address], [place], [place] [address]    Report Status 03/10/2019 FINAL  Final  Culture, blood (routine x 2)     Status: None   Collection Time: 03/04/19 11:50 PM   Specimen: BLOOD RIGHT WRIST  Result Value Ref Range Status   Specimen Description BLOOD RIGHT WRIST  Final   Special Requests   Final    BOTTLES DRAWN AEROBIC AND ANAEROBIC Blood Culture results may not be optimal due to an inadequate volume of blood received in culture bottles   Culture   Final    NO GROWTH 5 DAYS Performed at [facility], [place] [address] [place],  [address]    Report Status 03/10/2019 FINAL  Final     Labs: BNP (last 3 results) Recent Labs    03/04/19 2324  BNP 979.8*   Basic Metabolic Panel: Recent Labs  Lab 03/07/19 0119 03/08/19 0117 03/09/19 0252 03/10/19 0756 03/11/19 0231 03/12/19 1227 03/13/19 0739  NA 143  --  139 139 139 137 136  K 3.4*  --  3.3* 4.8 4.2 4.3 3.9  CL 109  --  104 107 105 102 99  CO2 26  --  27 23 26 26  27   GLUCOSE 164*  --  99 125* 98 102* 92  BUN 56*  --  37* 26* 20 19 19   CREATININE 1.24  --  1.01 0.89 0.84 0.81 0.83  CALCIUM 8.3*  --  7.7* 8.0* 8.1* 8.1* 8.3*  MG 2.4 2.6* 2.2 2.3  --   --  2.2  PHOS 1.6* 2.3* 4.2  --   --   --   --    Liver Function Tests: Recent Labs  Lab 03/07/19 0119  03/09/19 0252  AST 96* 68*  ALT 94* 82*  ALKPHOS 51 56  BILITOT 0.6 0.9  PROT 5.3* 4.6*  ALBUMIN 2.8* 2.5*   No results for input(s): LIPASE, AMYLASE in the last 168 hours. Recent Labs  Lab 03/07/19 1022  AMMONIA 31   CBC: Recent Labs  Lab 03/09/19 0252 03/10/19 0756 03/11/19 0231 03/12/19 1227 03/13/19 0312  WBC 11.7* 13.4* 12.9* 13.1* 10.5  NEUTROABS  --  10.0*  --   --   --   HGB 7.8* 9.6* 8.5* 8.7* 8.4*  HCT 24.7* 29.0* 27.1* 27.1* 26.9*  MCV 92.2 88.4 92.5 90.0 92.1  PLT 117* 107* 111* 135* 136*   Cardiac Enzymes: No results for input(s): CKTOTAL, CKMB, CKMBINDEX, TROPONINI in the last 168 hours. BNP: Invalid input(s): POCBNP CBG: Recent Labs  Lab 03/12/19 1153 03/12/19 1649 03/12/19 2223 03/13/19 0758 03/13/19 1214  GLUCAP 104* 117* 121* 96 86   D-Dimer No results for input(s): DDIMER in the last 72 hours. Hgb A1c No results for input(s): HGBA1C in the last 72 hours. Lipid Profile No results for input(s): CHOL, HDL, LDLCALC, TRIG, CHOLHDL, LDLDIRECT in the last 72 hours. Thyroid function studies No results for input(s): TSH, T4TOTAL, T3FREE, THYROIDAB in the last 72 hours.  Invalid input(s): FREET3 Anemia work up No results for input(s): VITAMINB12, FOLATE, FERRITIN, TIBC, IRON, RETICCTPCT in the last 72 hours. Urinalysis    Component Value Date/Time   COLORURINE STRAW (A) 03/04/2019 2334   APPEARANCEUR CLEAR 03/04/2019 2334   LABSPEC 1.021 03/04/2019 2334   PHURINE 6.0 03/04/2019 [place] 03/04/2019 [place] NEGATIVE 03/04/2019 2334   [place] NEGATIVE 03/04/2019 [place] 03/04/2019 2334   PROTEINUR  NEGATIVE 03/04/2019 2334   NITRITE NEGATIVE 03/04/2019 2334   LEUKOCYTESUR NEGATIVE 03/04/2019 2334   Sepsis Labs Invalid input(s): PROCALCITONIN,  WBC,  LACTICIDVEN Microbiology Recent Results (from the past 240 hour(s))  MRSA PCR Screening     Status: None   Collection Time: 03/04/19 10:39 PM   Specimen: Nasal Mucosa; Nasopharyngeal  Result Value Ref Range Status   MRSA by PCR NEGATIVE NEGATIVE Final    Comment:        The GeneXpert MRSA Assay (FDA approved for NASAL specimens only), is one component of a comprehensive MRSA colonization surveillance program. It is not intended to diagnose MRSA infection nor to guide or monitor treatment for MRSA infections. Performed at [facility], [place] [address], [place], [place] [address]   Culture, blood (routine x 2)     Status: None   Collection Time: 03/04/19 11:45 PM   Specimen: BLOOD RIGHT HAND  Result Value Ref Range Status   Specimen Description BLOOD RIGHT HAND  Final   Special Requests   Final    BOTTLES DRAWN AEROBIC AND ANAEROBIC Blood Culture adequate volume   Culture   Final    NO GROWTH 5 DAYS Performed at [facility], [place] [address], [place], [place] [address]    Report Status 03/10/2019 FINAL  Final  Culture, blood (routine x 2)     Status: None   Collection Time: 03/04/19 11:50 PM   Specimen: BLOOD RIGHT WRIST  Result Value Ref Range Status   Specimen Description BLOOD RIGHT WRIST  Final   Special Requests   Final    BOTTLES DRAWN AEROBIC AND ANAEROBIC Blood Culture results may not be optimal due to an inadequate volume of blood received in culture bottles  Culture   Final    NO GROWTH 5 DAYS Performed at [facility], [place] [address] [place], [place] [address]    Report Status 03/10/2019 FINAL  Final    Discharge Instructions     Discharge Instructions    Diet - low sodium heart healthy   Complete by: As directed    Discharge instructions   Complete by: As directed    You were  seen and examined in the hospital for blood loss from suspected GI bleed and intubated for difficulty breathing and cared for by a hospitalist and critical care physician.   Upon Discharge:  -Follow up with GI in 2 months -there have been changes to your medications, please see the After Visit Summary for changes and directions -you were started on Cymbalta, Take 30 mg daily for one week then increase to 60 mg for nerve pain and depression -Please adhere to proper diet and exercise for weight loss as we discussed and follow up with your PCP to discuss pain management for your herniated discs Make an appointment with your primary care physician within 7 days of discharge Get lab work prior to your follow up appointment with your PCP Bring all home medications to your appointment to review Request that your primary physician go over all hospital tests and procedures/radiological results at the follow up.   Please get all hospital records sent to your physician by signing a hospital release before you go home.     Read the complete instructions along with all the possible side effects for all the medicines you take and that have been prescribed to you. Take any new medicines after you have completely understood and accept all the possible adverse reactions/side effects.   If you have any questions about your discharge medications or the care you received while you were in the hospital, you can call the unit and asked to speak with the hospitalist on call. Once you are discharged, your primary care physician will handle any further medical issues. Please note that NO REFILLS for any discharge medications will be authorized, as it is imperative that you return to your primary care physician (or establish a relationship with a primary care physician if you do not have one) for your aftercare needs so that they can reassess your need for medications and monitor your lab values.   Do not drive, operate  heavy machinery, perform activities at heights, swimming or participation in water activities or provide baby sitting services if your were admitted for loss of consciousness/seizures or if you are on sedating medications including, but not limited to benzodiazepines, sleep medications, narcotic pain medications, etc., until you have been cleared to do so by a medical doctor.   Do not take more than prescribed medications.   Wear a seat belt while driving.  If you have smoked or chewed Tobacco in the last 2 years please stop smoking; also stop any regular Alcohol and/or any Recreational drug use including marijuana.  If you experience worsening of your admission symptoms or develop shortness of breath, chest pain, suicidal or homicidal thoughts or experience a life threatening emergency, you must seek medical attention immediately by calling 911 or calling your PCP immediately.   Increase activity slowly   Complete by: As directed      Allergies as of 03/13/2019      Reactions   Penicillins    Did it involve swelling of the face/tongue/throat, SOB, or low BP? N/A Did it involve  sudden or severe rash/hives, skin peeling, or any reaction on the inside of your mouth or nose? N/A Did you need to seek medical attention at a hospital or doctor's office?N/A When did it last happen?N/A If all above answers are "NO", may proceed with cephalosporin use.      Medication List    TAKE these medications   buprenorphine-naloxone 8-2 mg Subl SL tablet Commonly known as: SUBOXONE Place 2 tablets under the tongue daily.   DULoxetine 30 MG capsule Commonly known as: CYMBALTA Take 1 capsule (30 mg total) by mouth daily for 6 days, THEN 2 capsules (60 mg total) daily. Start taking on: March 14, 2019   furosemide 40 MG tablet Commonly known as: LASIX Take 2 tabs (80 mg ) in the Am and 1 tab (40 Mg) in the PM What changed:   how much to take  how to take this  when to take  this  additional instructions   gabapentin 800 MG tablet Commonly known as: NEURONTIN Take 800 mg by mouth 3 (three) times daily.   lisinopril 30 MG tablet Commonly known as: ZESTRIL 30 mg daily.   metolazone 5 MG tablet Commonly known as: ZAROXOLYN Take 1 tab daily 3 0 minutes prior to lasix What changed:   how much to take  how to take this  when to take this  additional instructions   pantoprazole 40 MG tablet Commonly known as: PROTONIX Take 1 tablet (40 mg total) by mouth 2 (two) times daily.   pneumococcal 23 valent vaccine 25 MCG/0.5ML injection Commonly known as: PNEUMOVAX-23 Inject 0.5 mLs into the muscle tomorrow at 10 am for 1 dose.   potassium chloride SA 20 MEQ tablet Commonly known as: KLOR-CON Take 20 mEq by mouth daily.   pravastatin 20 MG tablet Commonly known as: PRAVACHOL Take 20 mg by mouth daily.   senna-docusate 8.6-50 MG tablet Commonly known as: Senokot-S Take 1 tablet by mouth 2 (two) times daily.   Ventolin HFA 108 (90 Base) MCG/ACT inhaler Generic drug: albuterol Inhale 1 puff into the lungs every 4 (four) hours as needed for shortness of breath.   zolpidem 10 MG tablet Commonly known as: AMBIEN Take 10 mg by mouth at bedtime as needed for sleep.       Allergies  Allergen Reactions  . Penicillins     Did it involve swelling of the face/tongue/throat, SOB, or low BP? N/A Did it involve sudden or severe rash/hives, skin peeling, or any reaction on the inside of your mouth or nose? N/A Did you need to seek medical attention at a hospital or doctor's office?N/A When did it last happen?N/A If all above answers are "NO", may proceed with cephalosporin use.    Time coordinating discharge: Over 30 minutes   SIGNED:   Harold Hedge, D.O. [facility] Pager: [phone]  03/13/2019, 12:26 PM

## 2019-03-13 NOTE — Progress Notes (Signed)
   03/13/19 0508  Vitals  BP 112/63  MAP (mmHg) 76  BP Location Right Arm  BP Method Automatic  Patient Position (if appropriate) Sitting  Pulse Rate 62  Pulse Rate Source Monitor  ECG Heart Rate 64  Resp 12  Oxygen Therapy  SpO2 93 %  O2 Device Room Air  POSS Scale (Pasero Opioid Sedation Scale)  POSS *See Group Information* S-Acceptable,Sleep, easy to arouse  MEWS Score  MEWS RR 1  MEWS Pulse 0  MEWS Systolic 0  MEWS LOC 0  MEWS Temp 0  MEWS Score 1  MEWS Score Color Green   Per tele, patient had 6 beats of SVT, HR up to 128. Patient sleeping at this time. HR maintaining 60s. [place], NP notified via text page. Will continue to monitor.

## 2019-03-14 ENCOUNTER — Ambulatory Visit: Payer: Medicare Other | Admitting: Cardiology

## 2019-03-14 ENCOUNTER — Inpatient Hospital Stay (HOSPITAL_COMMUNITY): Payer: Medicare Other | Admitting: Occupational Therapy

## 2019-03-14 ENCOUNTER — Inpatient Hospital Stay (HOSPITAL_COMMUNITY): Payer: Medicare Other | Admitting: Physical Therapy

## 2019-03-14 ENCOUNTER — Inpatient Hospital Stay (HOSPITAL_COMMUNITY): Payer: Medicare Other

## 2019-03-14 DIAGNOSIS — D696 Thrombocytopenia, unspecified: Secondary | ICD-10-CM

## 2019-03-14 DIAGNOSIS — R5381 Other malaise: Principal | ICD-10-CM

## 2019-03-14 DIAGNOSIS — E876 Hypokalemia: Secondary | ICD-10-CM

## 2019-03-14 DIAGNOSIS — K254 Chronic or unspecified gastric ulcer with hemorrhage: Secondary | ICD-10-CM

## 2019-03-14 DIAGNOSIS — I10 Essential (primary) hypertension: Secondary | ICD-10-CM

## 2019-03-14 DIAGNOSIS — M7989 Other specified soft tissue disorders: Secondary | ICD-10-CM

## 2019-03-14 DIAGNOSIS — E8809 Other disorders of plasma-protein metabolism, not elsewhere classified: Secondary | ICD-10-CM

## 2019-03-14 DIAGNOSIS — D638 Anemia in other chronic diseases classified elsewhere: Secondary | ICD-10-CM

## 2019-03-14 DIAGNOSIS — E46 Unspecified protein-calorie malnutrition: Secondary | ICD-10-CM

## 2019-03-14 DIAGNOSIS — G894 Chronic pain syndrome: Secondary | ICD-10-CM

## 2019-03-14 DIAGNOSIS — K7460 Unspecified cirrhosis of liver: Secondary | ICD-10-CM

## 2019-03-14 LAB — COMPREHENSIVE METABOLIC PANEL
ALT: 37 U/L (ref 0–44)
AST: 34 U/L (ref 15–41)
Albumin: 2.5 g/dL — ABNORMAL LOW (ref 3.5–5.0)
Alkaline Phosphatase: 68 U/L (ref 38–126)
Anion gap: 10 (ref 5–15)
BUN: 21 mg/dL (ref 8–23)
CO2: 30 mmol/L (ref 22–32)
Calcium: 8.2 mg/dL — ABNORMAL LOW (ref 8.9–10.3)
Chloride: 95 mmol/L — ABNORMAL LOW (ref 98–111)
Creatinine, Ser: 0.9 mg/dL (ref 0.61–1.24)
GFR calc Af Amer: >60 mL/min (ref >60)
GFR calc non Af Amer: >60 mL/min (ref >60)
Glucose, Bld: 91 mg/dL (ref 70–99)
Potassium: 3.4 mmol/L — ABNORMAL LOW (ref 3.5–5.1)
Sodium: 135 mmol/L (ref 135–145)
Total Bilirubin: 0.6 mg/dL (ref 0.3–1.2)
Total Protein: 5.3 g/dL — ABNORMAL LOW (ref 6.5–8.1)

## 2019-03-14 LAB — CBC WITH DIFFERENTIAL/PLATELET
Abs Immature Granulocytes: 0.04 10*3/uL (ref 0.00–0.07)
Basophils Absolute: 0 10*3/uL (ref 0.0–0.1)
Basophils Relative: 0 %
Eosinophils Absolute: 0.3 10*3/uL (ref 0.0–0.5)
Eosinophils Relative: 3 %
HCT: 27 % — ABNORMAL LOW (ref 39.0–52.0)
Hemoglobin: 8.5 g/dL — ABNORMAL LOW (ref 13.0–17.0)
Immature Granulocytes: 0 %
Lymphocytes Relative: 14 %
Lymphs Abs: 1.3 10*3/uL (ref 0.7–4.0)
MCH: 28.7 pg (ref 26.0–34.0)
MCHC: 31.5 g/dL (ref 30.0–36.0)
MCV: 91.2 fL (ref 80.0–100.0)
Monocytes Absolute: 1 10*3/uL (ref 0.1–1.0)
Monocytes Relative: 10 %
Neutro Abs: 7 10*3/uL (ref 1.7–7.7)
Neutrophils Relative %: 73 %
Platelets: 144 10*3/uL — ABNORMAL LOW (ref 150–400)
RBC: 2.96 MIL/uL — ABNORMAL LOW (ref 4.22–5.81)
RDW: 15.9 % — ABNORMAL HIGH (ref 11.5–15.5)
WBC: 9.7 10*3/uL (ref 4.0–10.5)
nRBC: 0 % (ref 0.0–0.2)

## 2019-03-14 LAB — GLUCOSE, CAPILLARY
Glucose-Capillary: 113 mg/dL — ABNORMAL HIGH (ref 70–99)
Glucose-Capillary: 87 mg/dL (ref 70–99)
Glucose-Capillary: 87 mg/dL (ref 70–99)
Glucose-Capillary: 112 mg/dL — ABNORMAL HIGH (ref 70–99)

## 2019-03-14 MED ORDER — POTASSIUM CHLORIDE CRYS ER 20 MEQ PO TBCR
40.0000 meq | EXTENDED_RELEASE_TABLET | Freq: Once | ORAL | Status: AC
  Administered 2019-03-14: 40 meq via ORAL
  Filled 2019-03-14: qty 2

## 2019-03-14 NOTE — Progress Notes (Signed)
Physical Therapy Session Note  Patient Details  Name: Charles Gray MRN: [id number] Date of Birth: June 12, 1955  Today's Date: 03/14/2019 PT Individual Time: 1300-1400 PT Individual Time Calculation (min): 60 min   Short Term Goals: Week 1:  PT Short Term Goal 1 (Week 1): pt will transfer stand<>pivot with LRAD CGA PT Short Term Goal 2 (Week 1): Pt will ambulate 15f with LRAD min A PT Short Term Goal 3 (Week 1): pt will perform car transfer with LRAD CGA  Skilled Therapeutic Interventions/Progress Updates:   Received pt supine in bed, pt agreeable to therapy, and stating pain 7/10. RN made aware to administer pain medications. Pt performed bed mobility with supervision. Pt transferred stand<>pivot from bed<>chair with RW with bed elevated mod A. Pt performed WC mobility 222fwith bilateral UE's and supervision. Pt reported increased fatigue and requested to stop activity. Pt navigated 4 steps with bilateral rails mod A ascending with a step to pattern and descending with a step through pattern. O2 sat 93% and HR 88bpm on RA after activity. Pt reported lightheadedness after activity but improved with 5 minute seated rest break. Pt was educated on pursed lip breathing techniques as well as importance of avoiding upright activity when lightheadedness occurs. Pt ambulated 1029fn rehab apartment over carpet with RW min/mod A. Pt performed stand<>sit with RW onto recliner min A. Pt required 3 trials to stand from recliner with 2 pillow added for height with RW and was able to ascend on trial 3 using RW with mod A. Pt extremely fatigued after sit<>stand and stated "I haven't strained that hard in a while". Pt educated on avoiding Valsalva maneuver and encouraged to breathe during activity. Pt transported back to room due to increased fatigue. Concluded session with pt sitting EOB with RN present in room handling care.   Therapy Documentation Precautions:  Precautions Precautions: Fall Precaution  Comments: monitor SpO2 with activity Restrictions Weight Bearing Restrictions: No  Therapy/Group: Individual Therapy AnnAlfonse Alpers, DPT   03/14/2019, 12:25 PM

## 2019-03-14 NOTE — Plan of Care (Signed)
  Problem: Consults Goal: RH GENERAL PATIENT EDUCATION Description: See Patient Education module for education specifics. Outcome: Progressing   Problem: RH BOWEL ELIMINATION Goal: RH STG MANAGE BOWEL WITH ASSISTANCE Description: STG Manage Bowel with Assistance. Outcome: Progressing Goal: RH STG MANAGE BOWEL W/MEDICATION W/ASSISTANCE Description: STG Manage Bowel with Medication with Assistance. Outcome: Progressing   Problem: RH BLADDER ELIMINATION Goal: RH STG MANAGE BLADDER WITH ASSISTANCE Description: STG Manage Bladder With Assistance Outcome: Progressing Goal: RH STG MANAGE BLADDER WITH EQUIPMENT WITH ASSISTANCE Description: STG Manage Bladder With Equipment With Assistance Outcome: Progressing   Problem: RH SKIN INTEGRITY Goal: RH STG SKIN FREE OF INFECTION/BREAKDOWN Outcome: Progressing Goal: RH STG MAINTAIN SKIN INTEGRITY WITH ASSISTANCE Description: STG Maintain Skin Integrity With Assistance. Outcome: Progressing   Problem: RH SAFETY Goal: RH STG ADHERE TO SAFETY PRECAUTIONS W/ASSISTANCE/DEVICE Description: STG Adhere to Safety Precautions With Assistance/Device. Outcome: Progressing

## 2019-03-14 NOTE — Evaluation (Signed)
Occupational Therapy Assessment and Plan  Patient Details  Name: Charles Gray MRN: [id number] Date of Birth: [birth year]  OT Diagnosis: acute pain, muscular wasting and disuse atrophy and muscle weakness (generalized) Rehab Potential: Rehab Potential (ACUTE ONLY): Good ELOS: 10-12 days   Today's Date: 03/14/2019 OT Individual Time: 3646-8032 OT Individual Time Calculation (min): 60 min     Problem List:  Patient Active Problem List   Diagnosis Date Noted  . Hypoalbuminemia due to protein-calorie malnutrition ([place])   . Hypokalemia   . Thrombocytopenia ([place])   . Anemia of chronic disease   . Cirrhosis of liver without ascites ([place])   . Morbid obesity ([place])   . Chronic pain syndrome   . Debility 03/13/2019  . Acute respiratory failure with hypoxia ([place])   . Shock ([place])   . Dyspnea   . GIB (gastrointestinal bleeding) 03/04/2019  . Hyperlipidemia 01/24/2019  . Hypertension 01/24/2019  . Lumbar disc herniation 01/24/2019  . Insomnia 01/24/2019    Past Medical History:  Past Medical History:  Diagnosis Date  . Fatty liver disease, nonalcoholic   . Hyperlipidemia 01/24/2019  . Hypertension 01/24/2019  . Insomnia 01/24/2019  . Lumbar disc herniation 01/24/2019   Past Surgical History:  Past Surgical History:  Procedure Laterality Date  . ESOPHAGOGASTRODUODENOSCOPY (EGD) WITH PROPOFOL N/A 03/05/2019   Procedure: ESOPHAGOGASTRODUODENOSCOPY (EGD) WITH PROPOFOL;  Surgeon: Doran Stabler, MD;  Location: [facility];  Service: Gastroenterology;  Laterality: N/A;  Large Blood Clot Removed  . ESOPHAGOGASTRODUODENOSCOPY (EGD) WITH PROPOFOL N/A 03/06/2019   Procedure: ESOPHAGOGASTRODUODENOSCOPY (EGD) WITH PROPOFOL;  Surgeon: Thornton Park, MD;  Location: [facility];  Service: Gastroenterology;  Laterality: N/A;  . HEMORROIDECTOMY      Assessment & Plan Clinical Impression: Patient is a 63 y.o. right-handed male with history of hyperlipidemia, asthma, cirrhosis,  hypertension, herniated disc maintained on suboxone 2 tablets daily followed by pain management, morbid obesity BMI 42.60.  Per chart review patient lives alone.  Independent prior to admission his son does do the grocery shopping for him.  1 level home with stairs to entry.  Presented [facility]/02/[facility] to [facility] with increasing shortness of breath leg edema.  During his hospital course acute drop in blood pressure as well as hemoglobin with melanotic stool.  He had increasing leukocytosis and lactic acidosis but was afebrile.  Placed on broad-spectrum antibiotics.  CT of abdomen pelvis and abdominal CTA without obvious source of bleeding.  Hemoglobin did drop from 14-7.5.  He was given 3 units packed red blood cells placed on Protonix and transferred to [facility] 03/04/2019 for further evaluation.  Patient did require intubation for airway protection.  Underwent upper GI endoscopy 03/05/2019 per gastroenterology Dr. Loletha Carrow with no varices seen.  A large amount of fresh and clotted blood was found in the gastric fundus and in the proximal gastric body.  Normal esophagus.  Very mild portal hypertensive gastropathy.  There was a chain of lesions on a fold in the distal gastric fundus with purple discoloration.  Patient self extubated 03/06/2019.  Patient with initial bouts of confusion and agitation cranial CT scan 03/07/2019  Negative.  Hemoglobin is stabilized 8.4.  Recommendations are currently to repeat EGD in 2 months and continue Protonix twice daily.  He is tolerating a mechanical soft diet.  Noted thrombocytopenia improved to 111-136 felt to be reactive versus cirrhosis.  Therapy evaluations completed patient was admitted for a comprehensive rehab program.  Patient transferred to CIR on 03/13/2019 .    Patient currently  requires mod with basic self-care skills secondary to muscle weakness, decreased cardiorespiratoy endurance and decreased oxygen support and decreased standing balance and  decreased balance strategies.  Prior to hospitalization, patient could complete ADLs with independent .  Patient will benefit from skilled intervention to increase independence with basic self-care skills and increase level of independence with iADL prior to discharge home independently.  Anticipate patient will require intermittent supervision and follow up home health.  OT - End of Session Activity Tolerance: Tolerates 30+ min activity with multiple rests Endurance Deficit: Yes Endurance Deficit Description: required frequent rest breaks, decreased O2 sats with activity OT Assessment Rehab Potential (ACUTE ONLY): Good OT Barriers to Discharge: Decreased caregiver support;Home environment access/layout OT Patient demonstrates impairments in the following area(s): Balance;Endurance;Motor;Pain;Safety OT Basic ADL's Functional Problem(s): Grooming;Bathing;Dressing;Toileting OT Advanced ADL's Functional Problem(s): Simple Meal Preparation;Laundry OT Transfers Functional Problem(s): Toilet;Tub/Shower OT Additional Impairment(s): None OT Plan OT Intensity: Minimum of 1-2 x/day, 45 to 90 minutes OT Frequency: 5 out of 7 days OT Duration/Estimated Length of Stay: 10-12 days OT Treatment/Interventions: Medical illustrator training;Community reintegration;Discharge planning;Disease mangement/prevention;DME/adaptive equipment instruction;Functional mobility training;Pain management;Patient/family education;Psychosocial support;Self Care/advanced ADL retraining;Therapeutic Activities;Therapeutic Exercise;UE/LE Strength taining/ROM OT Basic Self-Care Anticipated Outcome(s): Mod I OT Toileting Anticipated Outcome(s): Mod I OT Bathroom Transfers Anticipated Outcome(s): Mod I OT Recommendation Patient destination: Home Follow Up Recommendations: Home health OT Equipment Recommended: Tub/shower bench;Other (comment)(toilet riser)   Skilled Therapeutic Intervention OT eval completed with discussion of  rehab process, OT purpose, POC, ELOS, and goals.  ADL assessment completed with bathing and dressing at sit > stand level at sink with focus on dynamic standing balance and endurance.  Pt completed bed mobility with CGA and sit > stand from EOB with mod assist.  Ambulated 5' with RW to sink with Min assist.  Engaged in bathing at sit> stand level with assistance for thoroughness when washing buttocks.  Pt donned shirt with setup, but no pants available on eval therefore donned hospital gown.  Pt ambulated to toilet with RW with CGA.  Completed toilet transfer on to [facility] over toilet, however pt reports he could not void and attempt BM while seated on BSC.  Pt sat on standard commode and required mod assist to stand from commode post toileting.  Ambulated back to bed with RW with Min assist and cues for safety/sequencing with RW.  Discussed toilet risers as alternative to [facility] with therapist showing pt some options.  However will need to ensure pt obtains one that will accommodate his weight.    OT Evaluation Precautions/Restrictions  Precautions Precautions: Fall Precaution Comments: monitor SpO2 with activity Restrictions Weight Bearing Restrictions: No Pain Pain Assessment Pain Scale: 0-10 Pain Score: 7  Pain Type: Chronic pain Pain Location: Hip Pain Orientation: Right;Left Pain Descriptors / Indicators: Sharp Pain Frequency: Constant Pain Onset: On-going Patients Stated Pain Goal: 3 Pain Intervention(s): RN made aware;Repositioned Home Living/Prior Functioning Home Living Family/patient expects to be discharged to:: Private residence Living Arrangements: Alone Available Help at Discharge: Available PRN/intermittently Type of Home: Apartment Home Access: Stairs to enter [employer] of Steps: 15 steps Entrance Stairs-Rails: Right, Left, Can reach both(pt thinks so, but is not possitive) Home Layout: One level Bathroom Shower/Tub: Chiropodist:  Standard Bathroom Accessibility: No Additional Comments: can go stay with son on one level, but he does not want to  Lives With: Alone IADL History Homemaking Responsibilities: Yes Meal Prep Responsibility: Primary Laundry Responsibility: Primary Cleaning Responsibility: Primary Bill Paying/Finance Responsibility: Primary Shopping Responsibility: Secondary(son has been  ordering through [email or web address] and having groceries delivered) Prior Function Level of Independence: Independent with basic ADLs, Independent with gait, Independent with transfers, Independent with homemaking with ambulation  Able to Take Stairs?: Yes Driving: No Vocation: On disability Vocation Requirements: havent worked since 1996, used to lay block and brick ADL ADL Grooming: Setup, Supervision/safety Where Assessed-Grooming: Sitting at sink Upper Body Bathing: Setup, Supervision/safety Where Assessed-Upper Body Bathing: Sitting at sink Lower Body Bathing: Minimal assistance Where Assessed-Lower Body Bathing: Sitting at sink, Standing at sink Upper Body Dressing: Minimal assistance Where Assessed-Upper Body Dressing: Sitting at sink Toileting: Contact guard Where Assessed-Toileting: Glass blower/designer: Psychiatric nurse Method: Counselling psychologist: Grab bars Vision Baseline Vision/History: Wears glasses Wears Glasses: Reading only Patient Visual Report: No change from baseline Vision Assessment?: No apparent visual deficits Cognition Overall Cognitive Status: Within Functional Limits for tasks assessed Arousal/Alertness: Awake/alert Orientation Level: Person;Place;Situation Person: Oriented Place: Oriented Situation: Oriented Year: 2020 Month: December Day of Week: Correct Memory: Appears intact Immediate Memory Recall: Sock;Blue;Bed Memory Recall Sock: Not able to recall Memory Recall Blue: Without Cue Memory Recall Bed: Without Cue Awareness: Appears  intact Problem Solving: Appears intact Safety/Judgment: Appears intact Sensation Sensation Light Touch: Impaired Detail Peripheral sensation comments: absent sensation from ankles to feet, decreased in lower leg, and reports mild numbness in Rt hand Light Touch Impaired Details: Impaired RUE;Absent RLE;Absent LLE Proprioception: Appears Intact Additional Comments: decreased L>R Coordination Gross Motor Movements are Fluid and Coordinated: No Fine Motor Movements are Fluid and Coordinated: No Coordination and Movement Description: Grossly uncoordinated due to R LE weakness Finger Nose Finger Test: [facility] Heel Shin Test: decreased bilaterally Motor  Motor Motor: Abnormal postural alignment and control Motor - Skilled Clinical Observations: Generalized weakness RLE>LLE Mobility  Bed Mobility Bed Mobility: Rolling Right;Rolling Left;Supine to Sit;Sit to Supine Rolling Right: Contact Guard/Touching assist Rolling Left: Contact Guard/Touching assist Supine to Sit: Contact Guard/Touching assist(HOB elevated) Sit to Supine: Contact Guard/Touching assist(HOB elevated) Transfers Sit to Stand: Moderate Assistance - Patient 50-74%  Trunk/Postural Assessment  Cervical Assessment Cervical Assessment: Within Functional Limits Thoracic Assessment Thoracic Assessment: Within Functional Limits Lumbar Assessment Lumbar Assessment: Within Functional Limits Postural Control Postural Control: Deficits on evaluation  Balance Balance Balance Assessed: Yes Static Sitting Balance Static Sitting - Balance Support: No upper extremity supported Static Sitting - Level of Assistance: 5: Stand by assistance(supervision) Dynamic Sitting Balance Dynamic Sitting - Balance Support: No upper extremity supported Dynamic Sitting - Level of Assistance: 5: Stand by assistance(CGA) Static Standing Balance Static Standing - Balance Support: No upper extremity supported Static Standing - Level of Assistance: 5:  Stand by assistance(CGA) Dynamic Standing Balance Dynamic Standing - Balance Support: Right upper extremity supported Dynamic Standing - Level of Assistance: 1: +2 Total assist(R UE support on rail) Extremity/Trunk Assessment RUE Assessment RUE Assessment: Within Functional Limits Active Range of Motion (AROM) Comments: WNL General Strength Comments: grossly 4/5 LUE Assessment LUE Assessment: Within Functional Limits General Strength Comments: grossly 5/5     Refer to Care Plan for Long Term Goals  Recommendations for other services: None    Discharge Criteria: Patient will be discharged from OT if patient refuses treatment 3 consecutive times without medical reason, if treatment goals not met, if there is a change in medical status, if patient makes no progress towards goals or if patient is discharged from hospital.  The above assessment, treatment plan, treatment alternatives and goals were discussed and mutually agreed upon: by patient  [place] Dense [facility] 03/14/2019, 11:09 AM

## 2019-03-14 NOTE — Evaluation (Signed)
Physical Therapy Assessment and Plan  Patient Details  Name: Charles Gray MRN: [id number] Date of Birth: 02-28-1956  PT Diagnosis: Abnormal posture, Abnormality of gait, Difficulty walking, Edema, Impaired sensation and Muscle weakness Rehab Potential: Good ELOS: 10-12 days   Today's Date: 03/14/2019 PT Individual Time: 0800-0919 PT Individual Time Calculation (min): 79 min    Problem List:  Patient Active Problem List   Diagnosis Date Noted  . Debility 03/13/2019  . Acute respiratory failure with hypoxia ([place])   . Shock ([place])   . Dyspnea   . GIB (gastrointestinal bleeding) 03/04/2019  . Hyperlipidemia 01/24/2019  . Hypertension 01/24/2019  . Lumbar disc herniation 01/24/2019  . Insomnia 01/24/2019    Past Medical History:  Past Medical History:  Diagnosis Date  . Fatty liver disease, nonalcoholic   . Hyperlipidemia 01/24/2019  . Hypertension 01/24/2019  . Insomnia 01/24/2019  . Lumbar disc herniation 01/24/2019   Past Surgical History:  Past Surgical History:  Procedure Laterality Date  . ESOPHAGOGASTRODUODENOSCOPY (EGD) WITH PROPOFOL N/A 03/05/2019   Procedure: ESOPHAGOGASTRODUODENOSCOPY (EGD) WITH PROPOFOL;  Surgeon: Doran Stabler, MD;  Location: [facility];  Service: Gastroenterology;  Laterality: N/A;  Large Blood Clot Removed  . ESOPHAGOGASTRODUODENOSCOPY (EGD) WITH PROPOFOL N/A 03/06/2019   Procedure: ESOPHAGOGASTRODUODENOSCOPY (EGD) WITH PROPOFOL;  Surgeon: Thornton Park, MD;  Location: [facility];  Service: Gastroenterology;  Laterality: N/A;  . HEMORROIDECTOMY      Assessment & Plan Clinical Impression: Patient is a 63 y.o. year old male with history of hyperlipidemia, asthma, cirrhosis, hypertension, herniated disc maintained on suboxone2 tablets daily followed by pain management, morbid obesity BMI 42.60.Presented 03/02/2019 to [facility] with increasing shortness of breath leg edema. During his hospital course acute drop in blood  pressure as well as hemoglobin with melanotic stool. He had increasing leukocytosis and lactic acidosis but was afebrile. Placed on broad-spectrum antibiotics. CT of abdomen pelvis and abdominal CTA without obvious source of bleeding. Hemoglobin did drop from 14-7.5. He was given 3 units packed red blood cells placed on Protonix and transferred to [facility] 03/04/2019 for further evaluation. Patient did require intubation for airway protection. Underwent upper GI endoscopy 03/05/2019 per gastroenterology Dr. Pura Spice no varices seen. A large amount of fresh and clotted blood was found in the gastric fundus and in the proximal gastric body. Normal esophagus. Very mild portal hypertensive gastropathy. There was a chain of lesions on a fold in the distal gastric fundus with purple discoloration. Patient self extubated 03/06/2019. Patient with initial bouts of confusion and agitation cranial CT scan 03/07/2019 Negative.Hemoglobin is stabilized 8.4. Recommendations are currently to repeat EGD in 2 months and continue Protonix twice daily. He is tolerating a mechanical soft diet. Noted thrombocytopenia improved to 111-125flt to be reactive versus cirrhosis.   Patient currently requires mod with mobility secondary to muscle weakness and decreased standing balance, decreased postural control and decreased balance strategies.  Prior to hospitalization, patient was independent  with mobility and lived with Alone in a [place]. Home access is 15 steps stairs to enter.  Patient will benefit from skilled PT intervention to maximize safe functional mobility, minimize fall risk and decrease caregiver burden for planned discharge home alone.  Anticipate patient will benefit from follow up HP[place]at discharge.  PT - End of Session Activity Tolerance: Tolerates 30+ min activity with multiple rests Endurance Deficit: Yes Endurance Deficit Description: Increased fatigue with activity,  required multiple rest breaks PT Assessment Rehab Potential (ACUTE/IP ONLY): Good PT Barriers to Discharge: Decreased  caregiver support;Home environment access/layout;Weight PT Barriers to Discharge Comments: Lives alone in 2nd story apartment with [address], son lives 3 miles away PT Patient demonstrates impairments in the following area(s): Balance;Endurance;Pain;Sensory;Motor PT Transfers Functional Problem(s): Bed Mobility;Bed to Chair;Car PT Locomotion Functional Problem(s): Ambulation;Wheelchair Mobility;Stairs PT Plan PT Intensity: Minimum of 1-2 x/day ,45 to 90 minutes PT Frequency: 5 out of 7 days PT Duration Estimated Length of Stay: 10-12 days PT Treatment/Interventions: Ambulation/gait training;Discharge planning;Functional mobility training;Therapeutic Activities;Balance/vestibular training;Disease management/prevention;Neuromuscular re-education;Therapeutic Exercise;Wheelchair propulsion/positioning;DME/adaptive equipment instruction;Pain management;Splinting/orthotics;UE/LE Strength taining/ROM;Community reintegration;Functional electrical stimulation;Patient/family education;Stair training;UE/LE Coordination activities PT Transfers Anticipated Outcome(s): Mod I with LRAD PT Locomotion Anticipated Outcome(s): Mod I with LRAD PT Recommendation Follow Up Recommendations: Home health PT Patient destination: Home Equipment Recommended: To be determined Equipment Details: has no equipment at home  Skilled Therapeutic Intervention Evaluation completed (see details above and below) with education on PT POC and goals and individual treatment initiated with focus on functional mobility/transfers, ambulation with and without AD, LE strength, balance, and improved activity tolerance.   Today's Interventions Received pt supine in bed, pt educated on PT evaluation and agreeable. Pt stated pain in low back and burning sensation in RLE but did not state pain number. Pt reported he received pain  medication prior to session. Pt performed bed mobility CGA with HOB elevated and use of bedrails. Pt transferred sit<>stand without AD mod A x 1 trial and min A x 1 trial from elevated bed. Pt transferred stand<>pivot from bed<>WC mod A with bed elevated. Pt ambulated 34f mod A +2 for safety without AD and occasional use of R rail. Pt demonstrated decreased weight shift to R, decrease LLE foot clearance, flexed trunk, wide BOS, and decreased trunk rotation. Pt ambulated 193fwith RW min A +2 for safety. Pt performed car transfer without AD mod A with increased time to transfer sit<>stand due to low surface. Pt performed WC mobility 5047fsing bilateral UE's and supervision. Pt performed stand<>pivot to toilet using armrest and grab bars min A. Pt able to perfrom hygiene management with supervision. Pt ambulated [id number] bathroom to bed with RW min A. Concluded session with pt supine in bed, needs within reach, and bed alarm on. Safety plan updated.   PT Evaluation Precautions/Restrictions Precautions Precautions: Fall Precaution Comments: monitor SpO2 with activity Restrictions Weight Bearing Restrictions: No Home Living/Prior Functioning Home Living Available Help at Discharge: Available PRN/intermittently Type of Home: Apartment Home Access: Stairs to enter Entrance Stairs-Number of Steps: 15 steps Entrance Stairs-Rails: Right;Left;Can reach both(pt thinks so, but is not positive) Home Layout: One level Bathroom Shower/Tub: Tub/Chiropodistandard Bathroom Accessibility: No Additional Comments: can go stay with son on one level, but he does not want to  Lives With: Alone Prior Function Level of Independence: Independent with basic ADLs;Independent with gait;Independent with transfers;Independent with homemaking with ambulation  Able to Take Stairs?: Yes Driving: No Vocation: On disability Vocation Requirements: havent worked since 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, used to lay block and  bricFacilities managerrall Cognitive Status: Within Functional Limits for tasks assessed Arousal/Alertness: Awake/alert Orientation Level: Oriented X4 Memory: Appears intact Awareness: Appears intact Problem Solving: Appears intact Safety/Judgment: Appears intact Sensation Sensation Light Touch: Impaired by gross assessment Decreased L>R Proprioception: Appears Intact Coordination Gross Motor Movements are Fluid and Coordinated: No Fine Motor Movements are Fluid and Coordinated: No Coordination and Movement Description: Grossly uncoordinated due to R LE weakness Finger Nose Finger Test: WFL [facility] Shin Test: decreased bilaterally  Motor  Motor Motor: Abnormal postural alignment  and control Motor - Skilled Clinical Observations: Generalized weakness RLE>LLE  Mobility Bed Mobility Bed Mobility: Rolling Right;Rolling Left;Supine to Sit;Sit to Supine Rolling Right: Contact Guard/Touching assist Rolling Left: Contact Guard/Touching assist Supine to Sit: Contact Guard/Touching assist(HOB elevated) Sit to Supine: Contact Guard/Touching assist(HOB elevated) Transfers Transfers: Sit to [employer] Transfers Sit to Stand: Moderate Assistance - Patient 50-74% Stand Pivot Transfers: Moderate Assistance - Patient 50 - 74% Stand Pivot Transfer Details: Verbal cues for technique;Verbal cues for precautions/safety Stand Pivot Transfer Details (indicate cue type and reason): Verbal cues for foot placement when turning Transfer (Assistive device): None Locomotion  Gait Ambulation: Yes Gait Assistance: 2 Helpers Gait Distance (Feet): 14 Feet Assistive device: None Gait Assistance Details: Verbal cues for technique;Verbal cues for gait pattern;Verbal cues for sequencing;Verbal cues for precautions/safety Gait Assistance Details: verbal cues to maintain postural control and weight shift Gait Gait: Yes Gait Pattern: Impaired Gait Pattern: Decreased step length - right;Decreased step  length - left;Decreased stance time - right;Decreased stride length;Decreased weight shift to right;Wide base of support;Poor foot clearance - left;Trunk flexed;Decreased trunk rotation Gait velocity: decreased Stairs / Additional Locomotion Stairs: Yes Stairs Assistance: Moderate Assistance - Patient 50 - 74% Stair Management Technique: Two rails Number of Stairs: 4 Height of Stairs: 6 Wheelchair Mobility Wheelchair Mobility: Yes Wheelchair Assistance: Chartered loss adjuster: Both upper extremities Wheelchair Parts Management: Needs assistance Distance: 45f  Trunk/Postural Assessment  Cervical Assessment Cervical Assessment: Within Functional Limits Thoracic Assessment Thoracic Assessment: Within Functional Limits Lumbar Assessment Lumbar Assessment: Within Functional Limits Postural Control Postural Control: Deficits on evaluation  Balance Balance Balance Assessed: Yes Static Sitting Balance Static Sitting - Balance Support: No upper extremity supported Static Sitting - Level of Assistance: 5: Stand by assistance(supervision) Dynamic Sitting Balance Dynamic Sitting - Balance Support: No upper extremity supported Dynamic Sitting - Level of Assistance: 5: Stand by assistance(CGA) Static Standing Balance Static Standing - Balance Support: No upper extremity supported Static Standing - Level of Assistance: 5: Stand by assistance(CGA) Dynamic Standing Balance Dynamic Standing - Balance Support: Right upper extremity supported Dynamic Standing - Level of Assistance: 1: +2 Total assist(R UE support on rail) Extremity Assessment  RLE Assessment RLE Assessment: Exceptions to WLarue D Carter Memorial HospitalRLE Strength Right Hip Flexion: 3+/5 Right Hip ABduction: 4-/5 Right Hip ADduction: 3+/5 Right Knee Flexion: 4-/5 Right Knee Extension: 4-/5 Right Ankle Dorsiflexion: 4-/5 Right Ankle Plantar Flexion: 4-/5 LLE Assessment LLE Assessment: Exceptions to WDoctors Outpatient Surgery Center LLCGeneral Strength  Comments: Grossly generalized to 4/5  Refer to Care Plan for Long Term Goals  Recommendations for other services: None   Discharge Criteria: Patient will be discharged from PT if patient refuses treatment 3 consecutive times without medical reason, if treatment goals not met, if there is a change in medical status, if patient makes no progress towards goals or if patient is discharged from hospital.  The above assessment, treatment plan, treatment alternatives and goals were discussed and mutually agreed upon: by patient  AAlfonse AlpersPT, DPT  03/14/2019, 7:30 AM

## 2019-03-14 NOTE — Progress Notes (Signed)
Lower extremity venous has been completed.   Preliminary results in CV Proc.   Abram Sander 03/14/2019 10:09 AM

## 2019-03-14 NOTE — Progress Notes (Signed)
Social Work Assessment and Plan   Patient Details  Name: Charles Gray MRN: [id number] Date of Birth: [birth year]  Today's Date: 03/14/2019  Problem List:  Patient Active Problem List   Diagnosis Date Noted  . Hypoalbuminemia due to protein-calorie malnutrition ([place])   . Hypokalemia   . Thrombocytopenia ([place])   . Anemia of chronic disease   . Cirrhosis of liver without ascites ([place])   . Morbid obesity ([place])   . Chronic pain syndrome   . Debility 03/13/2019  . Acute respiratory failure with hypoxia ([place])   . Shock ([place])   . Dyspnea   . GIB (gastrointestinal bleeding) 03/04/2019  . Hyperlipidemia 01/24/2019  . Hypertension 01/24/2019  . Lumbar disc herniation 01/24/2019  . Insomnia 01/24/2019   Past Medical History:  Past Medical History:  Diagnosis Date  . Fatty liver disease, nonalcoholic   . Hyperlipidemia 01/24/2019  . Hypertension 01/24/2019  . Insomnia 01/24/2019  . Lumbar disc herniation 01/24/2019   Past Surgical History:  Past Surgical History:  Procedure Laterality Date  . ESOPHAGOGASTRODUODENOSCOPY (EGD) WITH PROPOFOL N/A 03/05/2019   Procedure: ESOPHAGOGASTRODUODENOSCOPY (EGD) WITH PROPOFOL;  Surgeon: Doran Stabler, MD;  Location: [facility];  Service: Gastroenterology;  Laterality: N/A;  Large Blood Clot Removed  . ESOPHAGOGASTRODUODENOSCOPY (EGD) WITH PROPOFOL N/A 03/06/2019   Procedure: ESOPHAGOGASTRODUODENOSCOPY (EGD) WITH PROPOFOL;  Surgeon: Thornton Park, MD;  Location: [facility];  Service: Gastroenterology;  Laterality: N/A;  . HEMORROIDECTOMY     Social History:  reports that he has never smoked. He has never used smokeless tobacco. He reports previous drug use. Drug: Marijuana. He reports that he does not drink alcohol.  Family / Support Systems Marital Status: Single Patient Roles: Parent Children: Jason-son [phone] Other Supports: Another son in [place] Anticipated Caregiver: Son works and has a family, can check on  him Ability/Limitations of Caregiver: Son can only provide intermittent assist Caregiver Availability: Intermittent Family Dynamics: Close with both of his son's [place] to [place] be near his son, his other son is still in [place]. He gets to be close to his grandchildren here and see them grow up. He has a few neighbors who can check on him.  Social History Preferred language: English Religion:  Cultural Background: No issues Education: HS Read: Yes Write: Yes Employment Status: Disabled Public relations account executive Issues: No issues Guardian/Conservator: None-according to MD pt is capable of making his own decisions while here. He plans to include his son on any decisions needing to be made while here   Abuse/Neglect Abuse/Neglect Assessment Can Be Completed: Yes Physical Abuse: Denies Verbal Abuse: Denies Sexual Abuse: Denies Exploitation of patient/patient's resources: Denies Self-Neglect: Denies  Emotional Status Pt's affect, behavior and adjustment status: Pt is motivated to do well and regain his independence to go back home and manage on his own like he was doing before this happened. He is concerned about the 15 steps he needs to do to get into his second florr apartment. Recent Psychosocial Issues: other health issues-was managing and doing well until this Psychiatric History: History of depression acute MD started him on cymbalta will ask neuro-psych to see while here to assist with coping. He definitely would benefit. Substance Abuse History: No issues  Patient / Family Perceptions, Expectations & Goals Pt/Family understanding of illness & functional limitations: Pt and son can explain his GI bleed and the issues as a result of this. He talks with the MD daily and feels he has a good understanding of his treatment plan  going forward. He plans to push himself and will get back to the level he needs to be to go home alone. Premorbid pt/family roles/activities: Father, retiree,  grandfather, neighbor, etc Anticipated changes in roles/activities/participation: resume Pt/family expectations/goals: Pt states: " I want to be able to take care of myself, I plan too, but need to be able to get up fifteen stairs to my apartment."  Son states: " I will help but need to work and have a family."  Recruitment consultant: None Premorbid Home Care/DME Agencies: None Transportation available at discharge: Son pt did not drive prior to [employer] doesn't have a [employer]ista referrals recommended: Neuropsychology  Discharge Planning Living Arrangements: Alone Support Systems: Children Type of Residence: Private residence Insurance Resources: Information systems manager, [employer] (specify county) Museum/gallery curator Resources: [employer] Screen Referred: No Living Expenses: Education officer, community Management: Patient Does the patient have any problems obtaining your medications?: No Home Management: Self Patient/Family Preliminary Plans: Return home to his apartment but he will need to be able to get up 15 steps to the second floor. His son will come by and check on him and provide transportation to his appointments like he was prior to admission.  Will await therpay evaluations and work on discharge needs. Social Work Anticipated Follow Up Needs: HH/OP  Clinical Impression Pleasant gentleman who is motivated to do well here and reach mod/i level. His biggest hurdle is getting up 15 steps.Will ask neuro-psych to see for coping since being placed on cymbalta by acute MD. Work on discharge needs.  Elease Hashimoto 03/14/2019, 1:14 PM

## 2019-03-14 NOTE — Progress Notes (Signed)
[facility] PHYSICAL MEDICINE & REHABILITATION PROGRESS NOTE  Subjective/Complaints: Patient seen sitting up at the edge of his bed this morning eating breakfast.  He states he slept well overnight.  He states he is ready begin therapies.  ROS: Denies CP, shortness of breath, nausea, vomiting, diarrhea.  Objective: Vital Signs: Blood pressure (!) 107/56, pulse 64, temperature 98.3 F (36.8 C), resp. rate 20, height 6' 10"  (2.083 m), weight (!) 191.4 kg, SpO2 98 %. No results found. Recent Labs    03/13/19 0312 03/14/19 0447  WBC 10.5 9.7  HGB 8.4* 8.5*  HCT 26.9* 27.0*  PLT 136* 144*   Recent Labs    03/13/19 0739 03/14/19 0447  NA 136 135  K 3.9 3.4*  CL 99 95*  CO2 27 30  GLUCOSE 92 91  BUN 19 21  CREATININE 0.83 0.90  CALCIUM 8.3* 8.2*    Physical Exam: BP (!) 107/56 (BP Location: Left Arm)   Pulse 64   Temp 98.3 F (36.8 C)   Resp 20   Ht 6' 10"  (2.083 m)   Wt (!) 191.4 kg   SpO2 98%   BMI 44.12 kg/m  Constitutional: No distress . Vital signs reviewed.  Morbid obesity. HENT: Normocephalic.  Atraumatic. Eyes: EOMI. No discharge. Cardiovascular: No JVD. Respiratory: Normal effort.  No stridor. GI: Non-distended. Skin: Vascular changes bilateral lower extremities Psych: Normal mood.  Normal behavior. Musc: Bilateral lower extremity edema Neurological:  Alert Makes good eye contact follows commands.   Motor: Right upper extremity: 4+/5 proximal distal Left upper extremity: 5/5 proximal distal Right lower extremity: 4 -/5 proximal distal Left lower extremity: 4/5 proximal to distal   Assessment/Plan: 1. Functional deficits secondary to debility which require 3+ hours per day of interdisciplinary therapy in a comprehensive inpatient rehab setting.  Physiatrist is providing close team supervision and 24 hour management of active medical problems listed below.  Physiatrist and rehab team continue to assess barriers to discharge/monitor patient progress  toward functional and medical goals  Care Tool:  Bathing              Bathing assist       Upper Body Dressing/Undressing Upper body dressing        Upper body assist      Lower Body Dressing/Undressing Lower body dressing            Lower body assist       Toileting Toileting    Toileting assist Assist for toileting: Contact Guard/Touching assist     Transfers Chair/bed transfer  Transfers assist           Locomotion Ambulation   Ambulation assist              Walk 10 feet activity   Assist           Walk 50 feet activity   Assist           Walk 150 feet activity   Assist           Walk 10 feet on uneven surface  activity   Assist           Wheelchair     Assist               Wheelchair 50 feet with 2 turns activity    Assist            Wheelchair 150 feet activity     Assist  Medical Problem List and Plan: 1.  Debility secondary to GI bleed/hypovolemic/hemorrhagic shock/acute hypoxic respiratory failure.  Self extubated 03/06/2019  Begin CIR evaluations 2.  Antithrombotics: -DVT/anticoagulation: SCDs.    Vascular study pending             -antiplatelet therapy: N/A 3. Pain Management/chronic pain management: Cymbalta 30 mg daily, Suboxone 2 tablets daily, Neurontin 800 mg every 8 hours  Monitor with increased mobility 4. Mood: Provide emotional support             -antipsychotic agents: N/A 5. Neuropsych: This patient is capable of making decisions on his own behalf. 6. Skin/Wound Care: Routine skin checks 7. Fluids/Electrolytes/Nutrition: Routine in and outs 8.  Hypertension.  Lasix as directed, lisinopril 30 mg daily, Zaroxolyn 5 mg daily  Monitor with increased mobility 9.  GI bleed.  Follow-up gastroenterology services.  Plan EGD follow-up 2 weeks (~12/15).  Continue Protonix twice daily 10.  Morbid obesity.  BMI 42.60.   11.  Cirrhosis/hepatic  steatosis.    LFTs within normal limits on 12/1  Continue to monitor 12.  Thrombocytopenia/anemia.  Felt to be related to cirrhosis.    Hemoglobin 8.5 on 12/1  Platelets 144 on 12/1  Continue to monitor 13.  Hyperlipidemia.  Pravachol 14.  Hypokalemia  Potassium 3.4 on 12/1  Supplemented x1  Continue to monitor 15.  Hypoalbuminemia  Supplement initiated on 12/1  LOS: 1 days A FACE TO FACE EVALUATION WAS PERFORMED  Izaya Netherton Lorie Phenix 03/14/2019, 8:12 AM

## 2019-03-14 NOTE — Care Management (Signed)
[facility] Individual Statement of Services  Patient Name:  Charles Gray  Date:  03/14/2019  Welcome to the [facility].  Our goal is to provide you with an individualized program based on your diagnosis and situation, designed to meet your specific needs.  With this comprehensive rehabilitation program, you will be expected to participate in at least 3 hours of rehabilitation therapies Monday-Friday, with modified therapy programming on the weekends.  Your rehabilitation program will include the following services:  Physical Therapy (PT), Occupational Therapy (OT), 24 hour per day rehabilitation nursing, Neuropsychology, Case Management (Social Worker), Rehabilitation Medicine, Nutrition Services and Pharmacy Services  Weekly team conferences will be held on Wednesday to discuss your progress.  Your Social Worker will talk with you frequently to get your input and to update you on team discussions.  Team conferences with you and your family in attendance may also be held.  Expected length of stay: 10-12 days  Overall anticipated outcome: mod/i level  Depending on your progress and recovery, your program may change. Your Social Worker will coordinate services and will keep you informed of any changes. Your Social Worker's name and contact numbers are listed  below.  The following services may also be recommended but are not provided by the [facility]:    [place] will be made to provide these services after discharge if needed.  Arrangements include referral to agencies that provide these services.  Your insurance has been verified to be:  Medicare & Medicaid Your primary doctor is:  Cecille Amsterdam  Pertinent information will be shared with your doctor and your insurance company.  Social Worker:  Ovidio Kin, [place] or ([phone]  Information discussed with and copy given to patient by: Elease Hashimoto, 03/14/2019, 9:22 AM

## 2019-03-14 NOTE — IPOC Note (Signed)
Individualized overall Plan of Care [facility]) Patient Details Name: Yohann Curl MRN: [id number] DOB: 1955/10/22  Admitting Diagnosis: [facility] Problems: Principal Problem:   Debility Active Problems:   GIB (gastrointestinal bleeding)   Hypoalbuminemia due to protein-calorie malnutrition (HCC)   Hypokalemia   Thrombocytopenia (HCC)   Anemia of chronic disease   Cirrhosis of liver without ascites (HCC)   Morbid obesity (HCC)   Chronic pain syndrome     Functional Problem List: Nursing Edema, Skin Integrity, Motor, Pain, Bladder, Bowel  PT Balance, Endurance, Pain, Sensory, Motor  OT Balance, Endurance, Motor, Pain, Safety  SLP    TR         Basic ADL's: OT Grooming, Bathing, Dressing, Toileting     Advanced  ADL's: OT Simple Meal Preparation, Laundry     Transfers: PT Bed Mobility, Bed to Chair, Teacher, early years/pre, Tub/Shower     Locomotion: PT Ambulation, Emergency planning/management officer, Stairs     Additional Impairments: OT None  SLP        TR      Anticipated Outcomes Item Anticipated Outcome  Self Feeding    Swallowing      Basic self-care  Mod I  Toileting  Mod I   Bathroom Transfers Mod I  Bowel/Bladder  Patient will not have any incontinence episodes during admission.  Transfers  Mod I with LRAD  Locomotion  Mod I with LRAD  Communication     Cognition     Pain  Patient's chronic pain will be at a tolerable number (3 on pain scale) during admission.  Safety/Judgment  Patient will not have any falls throughout his admission.   Therapy Plan: PT Intensity: Minimum of 1-2 x/day ,45 to 90 minutes PT Frequency: 5 out of 7 days PT Duration Estimated Length of Stay: 10-12 days OT Intensity: Minimum of 1-2 x/day, 45 to 90 minutes OT Frequency: 5 out of 7 days OT Duration/Estimated Length of Stay: 10-12 days      Team Interventions: Nursing Interventions Patient/Family Education, Pain Management, Bladder Management, Bowel Management, Skin  Care/Wound Management  PT interventions Ambulation/gait training, Discharge planning, Functional mobility training, Therapeutic Activities, Balance/vestibular training, Disease management/prevention, Neuromuscular re-education, Therapeutic Exercise, Wheelchair propulsion/positioning, DME/adaptive equipment instruction, Pain management, Splinting/orthotics, UE/LE Strength taining/ROM, Community reintegration, Technical sales engineer stimulation, Barrister's clerk education, IT trainer, UE/LE Coordination activities  OT Interventions Training and development officer, Academic librarian, Discharge planning, Disease mangement/prevention, Engineer, drilling, Functional mobility training, Pain management, Patient/family education, Psychosocial support, Self Care/advanced ADL retraining, Therapeutic Activities, Therapeutic Exercise, UE/LE Strength taining/ROM  SLP Interventions    TR Interventions    SW/CM Interventions Discharge Planning, Psychosocial Support, Patient/Family Education   Barriers to Discharge MD  Medical stability and Weight  Nursing Inaccessible home environment needs home walker  PT Decreased caregiver support, Home environment access/layout Lives alone in 2nd story apartment with 15 STE, son lives 3 miles away but isn't available 24/7  OT Decreased caregiver support, Tree surgeon    SLP      SW       Team Discharge Planning: Destination: PT-Home ,OT- Home , SLP-  Projected Follow-up: PT-Home health PT, OT-  Home health OT, SLP-  Projected Equipment Needs: PT-To be determined, OT- Tub/shower bench, Other (comment)(toilet riser), SLP-  Equipment Details: PT-has no equipment at home, OT-  Patient/family involved in discharge planning: PT- Patient,  OT-Patient, SLP-   MD ELOS: 10-14 days. Medical Rehab Prognosis:  Good Assessment: 63 year old right-handed male with history of hyperlipidemia, asthma, cirrhosis, hypertension, herniated  disc  maintained on suboxone 2 tablets daily followed by pain management, morbid obesity BMI 42.60.  Presented [facility]/02/[facility] to [facility] with increasing shortness of breath leg edema.  During his hospital course acute drop in blood pressure as well as hemoglobin with melanotic stool.  He had increasing leukocytosis and lactic acidosis but was afebrile.  Placed on broad-spectrum antibiotics.  CT of abdomen pelvis and abdominal CTA without obvious source of bleeding.  Hemoglobin did drop from 14-7.5.  He was given 3 units packed red blood cells placed on Protonix and transferred to [facility] 03/04/2019 for further evaluation.  Patient did require intubation for airway protection.  Underwent upper GI endoscopy 03/05/2019 per gastroenterology Dr. Loletha Carrow with no varices seen.  A large amount of fresh and clotted blood was found in the gastric fundus and in the proximal gastric body.  Normal esophagus.  Very mild portal hypertensive gastropathy.  There was a chain of lesions on a fold in the distal gastric fundus with purple discoloration.  Patient self extubated 03/06/2019.  Patient with initial bouts of confusion and agitation cranial CT scan 03/07/2019  Negative.  Hemoglobin is stabilized 8.4.  Recommendations are currently to repeat EGD in 2 months and continue Protonix twice daily.  He is tolerating a mechanical soft diet.  Noted thrombocytopenia felt to be reactive versus cirrhosis.  Patient with resutling functional deficits with endurance, self-care, mobility.  Will set goals for Mod I with PT/OT.   Due to the current state of emergency, patients may not be receiving their 3-hours of Medicare-mandated therapy.  See Team Conference Notes for weekly updates to the plan of care

## 2019-03-14 NOTE — Progress Notes (Signed)
Inpatient Rehabilitation  Patient information reviewed and entered into eRehab system by Jovee Dettinger M. Ceciley Buist, M.A., CCC/SLP, PPS Coordinator.  Information including medical coding, functional ability and quality indicators will be reviewed and updated through discharge.    

## 2019-03-15 ENCOUNTER — Inpatient Hospital Stay (HOSPITAL_COMMUNITY): Payer: Medicare Other

## 2019-03-15 ENCOUNTER — Inpatient Hospital Stay (HOSPITAL_COMMUNITY): Payer: Medicare Other | Admitting: Occupational Therapy

## 2019-03-15 NOTE — Progress Notes (Signed)
Social Work Patient ID: Charles Gray, male   DOB: 1955-05-05, 63 y.o.   MRN: [id number]  Met with pt and son to discuss team conference goals mod/i level and target discharge date 12/10. He feels he is progressing and wants to be good before he goes home. He just wished his pain was better managed and MD is aware of this. He wants to work on steps more due to he has 15 steps to get into his apartment. Work on discharge needs. Son has brought his clothes in.

## 2019-03-15 NOTE — Progress Notes (Addendum)
[facility] [facility] PHYSICAL MEDICINE & REHABILITATION PROGRESS NOTE  Subjective/Complaints: Patient seen sitting up in bed this AM.  He states he slept well overnight. He states he had a good first day of therapies.  ROS: Denies CP, shortness of breath, nausea, vomiting, diarrhea.  Objective: Vital Signs: Blood pressure (!) 84/47, pulse 65, temperature 98 F (36.7 C), temperature source Oral, resp. rate 16, height 6' 10"  (2.083 m), weight (!) 186.5 kg, SpO2 93 %. Vas [place] Lower Extremity Venous (dvt)  Result Date: 03/14/2019  Lower Venous Study Indications: Swelling.  Limitations: Poor ultrasound/tissue interface and body habitus. Comparison Study: no prior Performing Technologist: Abram Sander RVS  Examination Guidelines: A complete evaluation includes B-mode imaging, spectral Doppler, color Doppler, and power Doppler as needed of all accessible portions of each vessel. Bilateral testing is considered an integral part of a complete examination. Limited examinations for reoccurring indications may be performed as noted.  +---------+---------------+---------+-----------+----------+--------------+ RIGHT    CompressibilityPhasicitySpontaneityPropertiesThrombus Aging +---------+---------------+---------+-----------+----------+--------------+ CFV      Full           Yes      Yes                                 +---------+---------------+---------+-----------+----------+--------------+ SFJ      Full                                                        +---------+---------------+---------+-----------+----------+--------------+ FV Prox  Full                                                        +---------+---------------+---------+-----------+----------+--------------+ FV Mid   Full                                                        +---------+---------------+---------+-----------+----------+--------------+ FV DistalFull                                                         +---------+---------------+---------+-----------+----------+--------------+ PFV      Full                                                        +---------+---------------+---------+-----------+----------+--------------+ POP      Full           Yes      Yes                                 +---------+---------------+---------+-----------+----------+--------------+ PTV  Not visualized +---------+---------------+---------+-----------+----------+--------------+ PERO                                                  Not visualized +---------+---------------+---------+-----------+----------+--------------+   +---------+---------------+---------+-----------+----------+--------------+ LEFT     CompressibilityPhasicitySpontaneityPropertiesThrombus Aging +---------+---------------+---------+-----------+----------+--------------+ CFV      Full           Yes      Yes                                 +---------+---------------+---------+-----------+----------+--------------+ SFJ      Full                                                        +---------+---------------+---------+-----------+----------+--------------+ FV Prox  Full                                                        +---------+---------------+---------+-----------+----------+--------------+ FV Mid   Full                                                        +---------+---------------+---------+-----------+----------+--------------+ FV DistalFull                                                        +---------+---------------+---------+-----------+----------+--------------+ PFV      Full                                                        +---------+---------------+---------+-----------+----------+--------------+ POP      Full           Yes      Yes                                  +---------+---------------+---------+-----------+----------+--------------+ PTV                                                   Not visualized +---------+---------------+---------+-----------+----------+--------------+ PERO                                                  Not visualized +---------+---------------+---------+-----------+----------+--------------+     Summary: Right:  There is no evidence of deep vein thrombosis in the lower extremity. However, portions of this examination were limited- see technologist comments above. No cystic structure found in the popliteal fossa. Left: There is no evidence of deep vein thrombosis in the lower extremity. However, portions of this examination were limited- see technologist comments above. No cystic structure found in the popliteal fossa.  *See table(s) above for measurements and observations. Electronically signed by Deitra Mayo MD on 03/14/2019 at 12:20:11 PM.    Final    Recent Labs    03/13/19 0312 03/14/19 0447  WBC 10.5 9.7  HGB 8.4* 8.5*  HCT 26.9* 27.0*  PLT 136* 144*   Recent Labs    03/13/19 0739 03/14/19 0447  NA 136 135  K 3.9 3.4*  CL 99 95*  CO2 27 30  GLUCOSE 92 91  BUN 19 21  CREATININE 0.83 0.90  CALCIUM 8.3* 8.2*    Physical Exam: BP (!) 84/47 (BP Location: Right Arm)   Pulse 65   Temp 98 F (36.7 C) (Oral)   Resp 16   Ht 6' 10"  (2.083 m)   Wt (!) 186.5 kg   SpO2 93%   BMI 42.99 kg/m  Constitutional: No distress . Vital signs reviewed. Morbid obesity.  HENT: Normocephalic.  Atraumatic. Eyes: EOMI. No discharge. Cardiovascular: No JVD. Respiratory: Normal effort.  No stridor. GI: Non-distended. Skin: Vascular changes bilateral lower extremities. Psych: Normal mood.  Normal behavior. Musc: Bilateral lower extremity edema Neurological:  Alert Makes good eye contact follows commands.   Motor: Right upper extremity: 4+/5 proximal distal Left upper extremity: 5/5 proximal distal Right  lower extremity: 4-/5 proximal distal Left lower extremity: 4/5 proximal to distal   Assessment/Plan: 1. Functional deficits secondary to debility which require 3+ hours per day of interdisciplinary therapy in a comprehensive inpatient rehab setting.  Physiatrist is providing close team supervision and 24 hour management of active medical problems listed below.  Physiatrist and rehab team continue to assess barriers to discharge/monitor patient progress toward functional and medical goals  Care Tool:  Bathing    Body parts bathed by patient: Right arm, Left arm, Chest, Abdomen, Front perineal area, Right upper leg, Left upper leg, Face   Body parts bathed by helper: Buttocks, Right lower leg, Left lower leg     Bathing assist Assist Level: Moderate Assistance - Patient 50 - 74%     Upper Body Dressing/Undressing Upper body dressing   What is the patient wearing?: Hospital gown only    Upper body assist Assist Level: Set up assist    Lower Body Dressing/Undressing Lower body dressing      What is the patient wearing?: Hospital gown only     Lower body assist Assist for lower body dressing: Minimal Assistance - Patient > 75%     Toileting Toileting    Toileting assist Assist for toileting: Contact Guard/Touching assist     Transfers Chair/bed transfer  Transfers assist     Chair/bed transfer assist level: Moderate Assistance - Patient 50 - 74%     Locomotion Ambulation   Ambulation assist      Assist level: 2 helpers Assistive device: No Device Max distance: 83f   Walk 10 feet activity   Assist     Assist level: 2 helpers Assistive device: No Device   Walk 50 feet activity   Assist Walk 50 feet with 2 turns activity did not occur: Safety/medical concerns         Walk 150  feet activity   Assist Walk 150 feet activity did not occur: Safety/medical concerns         Walk 10 feet on uneven surface  activity   Assist Walk 10  feet on uneven surfaces activity did not occur: Safety/medical concerns         Wheelchair     Assist Will patient use wheelchair at discharge?: Yes Type of Wheelchair: Manual    Wheelchair assist level: Supervision/Verbal cueing Max wheelchair distance: 52f    Wheelchair 50 feet with 2 turns activity    Assist        Assist Level: Supervision/Verbal cueing   Wheelchair 150 feet activity     Assist Wheelchair 150 feet activity did not occur: Safety/medical concerns          Medical Problem List and Plan: 1.  Debility secondary to GI bleed/hypovolemic/hemorrhagic shock/acute hypoxic respiratory failure. Self extubated 03/06/2019.  Cont CIR  Team conference today to discuss current and goals and coordination of care, home and environmental barriers, and discharge planning with nursing, case manager, and therapies.  2.  Antithrombotics: -DVT/anticoagulation: SCDs.    Vascular study reviewed, negative for DVT             -antiplatelet therapy: N/A 3. Pain Management/chronic pain management: Cymbalta 30 mg daily, Suboxone 2 tablets daily, Neurontin 800 mg every 8 hours  Relatively controlled on 12/2 4. Mood: Provide emotional support             -antipsychotic agents: N/A 5. Neuropsych: This patient is capable of making decisions on his own behalf. 6. Skin/Wound Care: Routine skin checks 7. Fluids/Electrolytes/Nutrition: Routine in and outs 8. Hypertension.  Lasix as directed, lisinopril 30 mg daily, Zaroxolyn 5 mg daily  Relatively low on 12/2, monitor for trend  Monitor with increased mobility 9. GI bleed.  Follow-up gastroenterology services.  Plan EGD follow-up 2 weeks (~12/15).  Continue Protonix twice daily 10. Morbid obesity.  BMI 42.60.   11. Cirrhosis/hepatic steatosis.    LFTs within normal limits on 12/1  Continue to monitor 12. Thrombocytopenia/anemia.  Felt to be related to cirrhosis.    Hemoglobin 8.5 on 12/1  Platelets 144 on  12/1  Continue to monitor 13. Hyperlipidemia.  Pravachol 14. Hypokalemia  Potassium 3.4 on 12/1, labs ordered for tomorrow  Supplemented x1  Continue to monitor 15. Hypoalbuminemia  Supplement initiated on 12/1 16. Lower extremity edema  Cont lasix, will consider change if necessary Filed Weights   03/13/19 1711 03/15/19 0600  Weight: (!) 191.4 kg (!) 186.5 kg    LOS: 2 days A FACE TO FACE EVALUATION WAS PERFORMED  Kyliana Standen ALorie Phenix12/05/2018, 8:05 AM

## 2019-03-15 NOTE — Patient Care Conference (Signed)
Inpatient RehabilitationTeam Conference and Plan of Care Update Date: 03/15/2019   Time: 11:30 AM    Patient Name: Charles Gray      Medical Record Number: [id number]  Date of Birth: 1955-10-15 Sex: Male         Room/Bed: 4W03C/4W03C-01 Payor Info: Payor: MEDICARE / Plan: MEDICARE PART A AND B / Product Type: *No Product type* /    Admit Date/Time:  03/13/2019  3:51 PM  Primary Diagnosis:  Debility  Patient Active Problem List   Diagnosis Date Noted  . Hyponatremia   . Benign essential HTN   . Drug induced constipation   . Peripheral edema   . Hypotension due to drugs   . Hypoalbuminemia due to protein-calorie malnutrition ([place])   . Hypokalemia   . Thrombocytopenia ([place])   . Anemia of chronic disease   . Cirrhosis of liver without ascites ([place])   . Morbid obesity ([place])   . Chronic pain syndrome   . Debility 03/13/2019  . Acute respiratory failure with hypoxia ([place])   . Shock ([place])   . Dyspnea   . GIB (gastrointestinal bleeding) 03/04/2019  . Hyperlipidemia 01/24/2019  . Hypertension 01/24/2019  . Lumbar disc herniation 01/24/2019  . Insomnia 01/24/2019    Expected Discharge Date: Expected Discharge Date: 03/23/19  Team Members Present: Physician leading conference: Dr. Delice Lesch Social Worker Present: Ovidio Kin, LCSW Nurse Present: Other (comment)(Blair Montanti-LPN) Case Manager: Karene Fry, RN PT Present: Becky Sax, PT OT Present: Simonne Come, OT PPS Coordinator present : Gunnar Fusi, SLP     Current Status/Progress Goal Weekly Team Focus  Bowel/Bladder   Patient is Continent of B/B LBM 03/13/19, continue schedule and prn medications  maintain continence  Address toileting needs, QS/PRN   Swallow/Nutrition/ Hydration             ADL's   CGA bathing and dressing, Min A transfers with RW  Mod I  ADL retraining, activity tolerance, home making tasks, pt eduction, d/c planning   Mobility   bed mobility CGA, transfers without AD mod A, with AD  min A, ambulation 48f mod A, WC mobility 562fsupervision, 4 stairs 2 rails mod A  Mod I with LRAD  transfers, ambulation, stair navigation, LE strength, balance, improved endurance   Communication             Safety/Cognition/ Behavioral Observations            Pain   Verbalize pain /discomfort to LE's,and lower back throbbing aching pain sensation score 7/10 on pain scale .Tylenol po provided with decrease in discomfort verbalized ,  Pain goal < 3,  Continue to assess and monitor QS/PRN   Skin   LE's edema bilaterally pitting with flaky dry sensitive to touch, ecchymosis area to abdomen, buttocks and LE, barrier cram applied PRN  Maintain skin integrity, prevent skin breakdown  QS/PRN assessment and monitoring      *See Care Plan and progress notes for long and short-term goals.     Barriers to Discharge  Current Status/Progress Possible Resolutions Date Resolved   Nursing  Inaccessible home environment  needs home walker            PT  Decreased caregiver support;Home environment access/layout  Lives alone in 2nd story apartment with [id number], son lives 3 miles away but isn't available 24/7              OT Decreased caregiver support;Home environment access/layout  SLP                SW                Discharge Planning/Teaching Needs:  Home alone with son checking in on him, will need to be mod/i level so will be safe alone at home if feasible to reach while here. Fifteen stairs to apartement.      Team Discussion: h/o HTN, monitoring BP, LE edema, daily weights.  RN - pain meds not enough, back/leg pain, weight up, hardly eating, ?lasix not working, cont B/B.  OT back pain, took shower CGA, has no clothes, son to bring in clothes? Fatigues quickly, O2 sats low 90s, can order TEDs, mod I goals.  PT CGA bed, min A transfers, amb 50' min A, mod A for 4 steps, mod I goals, has 15 steps at home, fatigues.   Revisions to Treatment Plan: N/A     Medical  Summary Current Status: Debility secondary to GI bleed/hypovolemic/hemorrhagic shock/acute hypoxic respiratory failure. Self extubated 03/06/2019 Weekly Focus/Goal: Improve mobility, HTN, thrombocytopenia, pain, lower extremity edema  Barriers to Discharge: Medical stability;Weight;Home enviroment access/layout   Possible Resolutions to Barriers: Therapies, optimzie BP meds, follow labs, optimzie diuretics   Continued Need for Acute Rehabilitation Level of Care: The patient requires daily medical management by a physician with specialized training in physical medicine and rehabilitation for the following reasons: Direction of a multidisciplinary physical rehabilitation program to maximize functional independence : Yes Medical management of patient stability for increased activity during participation in an intensive rehabilitation regime.: Yes Analysis of laboratory values and/or radiology reports with any subsequent need for medication adjustment and/or medical intervention. : Yes   I attest that I was present, lead the team conference, and concur with the assessment and plan of the team.   [name] 03/17/2019, 11:24 AM  Team conference was held via web/ teleconference due to [place] - 19

## 2019-03-15 NOTE — Plan of Care (Signed)
  Problem: Consults Goal: RH GENERAL PATIENT EDUCATION Description: See Patient Education module for education specifics. Outcome: Progressing   Problem: RH BOWEL ELIMINATION Goal: RH STG MANAGE BOWEL WITH ASSISTANCE Description: STG Manage Bowel with Assistance. Outcome: Progressing Goal: RH STG MANAGE BOWEL W/MEDICATION W/ASSISTANCE Description: STG Manage Bowel with Medication with Assistance. Outcome: Progressing   Problem: RH BLADDER ELIMINATION Goal: RH STG MANAGE BLADDER WITH ASSISTANCE Description: STG Manage Bladder With Assistance Outcome: Progressing Goal: RH STG MANAGE BLADDER WITH EQUIPMENT WITH ASSISTANCE Description: STG Manage Bladder With Equipment With Assistance Outcome: Progressing   Problem: RH SKIN INTEGRITY Goal: RH STG SKIN FREE OF INFECTION/BREAKDOWN Outcome: Progressing Goal: RH STG MAINTAIN SKIN INTEGRITY WITH ASSISTANCE Description: STG Maintain Skin Integrity With Assistance. Outcome: Progressing   Problem: RH SAFETY Goal: RH STG ADHERE TO SAFETY PRECAUTIONS W/ASSISTANCE/DEVICE Description: STG Adhere to Safety Precautions With Assistance/Device. Outcome: Progressing

## 2019-03-15 NOTE — Progress Notes (Signed)
Occupational Therapy Session Note  Patient Details  Name: Charles Gray MRN: [id number] Date of Birth: 01/13/1956  Today's Date: 03/15/2019 OT Individual Time: 2244-9753 and 0051-1021 OT Individual Time Calculation (min): 72 min and 27 min   Short Term Goals: Week 1:  OT Short Term Goal 1 (Week 1): Pt will complete LB dressing with min assist OT Short Term Goal 2 (Week 1): Pt will complete toilet tranfers with Supervision with LRAD OT Short Term Goal 3 (Week 1): Pt will complete 2 grooming tasks in standing with supervision to demonstrate increased endurance/activity tolerance OT Short Term Goal 4 (Week 1): Pt will complete bathing with min assist  Skilled Therapeutic Interventions/Progress Updates:    1) Treatment session with focus on ADL retraining with functional mobility, transfers, and dynamic standing balance during bathing and dressing. Completed sit > stand from elevated EOB with Min assist.  Ambulated to shower with RW with CGA and cues for transfer on to seat in room shower.  Engaged in bathing at sit > stand level with CGA when standing in shower.  Pt reports pain in back during mobility with some relief from shower.  CGA when ambulating back to room. Applied lotion to feet and donned socks seated EOB in figure 4 position.  Pt required frequent rest breaks throughout session.  2) Treatment session with focus on dressing, sit > stand from lower surfaces, and functional mobility in room.  Pt received upright in bed reporting pain in back and buttocks from sitting in bed.  Engaged in dressing task, as pt's son had brought in clothes.  Pt completed LB dressing with increased time and CGA when standing to pull pants over hips.  UB dressing completed with setup assist.  Focused on sit <> stand from hospital bed in lowest setting, as pt reports having bed that is low to floor.  Pt able to complete with initial min assist, progressing to CGA.  Ambulated to bathroom with RW with CGA and pt  completed toileting tasks with CGA.  Returned to bed and left sitting upright in bed with all needs in reach.  Therapy Documentation Precautions:  Precautions Precautions: Fall Precaution Comments: monitor SpO2 with activity Restrictions Weight Bearing Restrictions: No General:   Vital Signs:  Pain:   Pt with c/o pain in back.  RN notified.  Pain decreased with shower.  Therapy/Group: Individual Therapy  Simonne Come 03/15/2019, 2:02 PM

## 2019-03-15 NOTE — Progress Notes (Signed)
Physical Therapy Session Note  Patient Details  Name: Charles Gray MRN: [id number] Date of Birth: [birth year]  Today's Date: 03/15/2019 PT Individual Time: 1275-1700 and 1300-1345  PT Individual Time Calculation (min): 55 min and 45 min  Short Term Goals: Week 1:  PT Short Term Goal 1 (Week 1): pt will transfer stand<>pivot with LRAD CGA PT Short Term Goal 2 (Week 1): Pt will ambulate 82f with LRAD min A PT Short Term Goal 3 (Week 1): pt will perform car transfer with LRAD CGA  Skilled Therapeutic Interventions/Progress Updates:   Treatment Session 1: 1030-1125 55 min Received pt supine in bed, pt agreeable to therapy, and stated that his bottom was sore from sitting in the bed. Pt did not state pain level during today's session. Pt performed bed mobility CGA with HOB elevated. Pt ambulated 147fwith RW to bathroom min A. Pt performed toilet transfer CGA and was able to perform hygiene with supervision. Therapist switched out WC to 24x18 chair for patient comfort. Pt performed WC mobility 15059fsing bilateral UE's with supervision and increased time. Pt reported fatigue after WC mobility and required rest and water break. Pt ambulated 62f9fth RW min A. O2 sat 96% and HR 96bpm after ambulation. Pt transferred stand<>pivot with RW min A x2 trials to mat. Pt performed sit<>stand x5 without UE support CGA. Pt reported fatigue after activity and required rest and water break. Concluded session with pt sitting in WC, needs within reach, and chair pad alarm on.   Treatment Session 2: [id number] Received pt sitting in WC, pt agreeable to therapy, and reported pain but did not state what level. Pt denied pain medication at the time of therapy. Session focused on functional mobility/transfers, LE strength, balance, stair navigation, and improved activity tolerance. Pt reported that his bottom was sore throughout session and required pillow on top of every surface he sat on. Pt transported to  therapy gym for time management purposes. Pt navigated 8 steps with bilateral rails min A ascending and descending varying from step to/step through pattern. Pt fatigued after activity and required seated rest break. Pt transferred WC<>mat stand<>pivot with RW min A. Pt performed bilateral standing toe taps on 3in step with bilateral UE support on RW 2x20 CGA. Pt performed stand<>pivot with RW from WC<>bed min A. Pt performed bed mobility CGA with HOB elevated. Concluded session with pt supine in bed, needs within reach, and bed alarm on.  Therapy Documentation Precautions:  Precautions Precautions: Fall Precaution Comments: monitor SpO2 with activity Restrictions Weight Bearing Restrictions: No   Therapy/Group: Individual Therapy AnnaAlfonse Alpers DPT   03/15/2019, 7:47 AM

## 2019-03-16 ENCOUNTER — Inpatient Hospital Stay (HOSPITAL_COMMUNITY): Payer: Medicare Other

## 2019-03-16 ENCOUNTER — Inpatient Hospital Stay (HOSPITAL_COMMUNITY): Payer: Medicare Other | Admitting: Occupational Therapy

## 2019-03-16 DIAGNOSIS — R609 Edema, unspecified: Secondary | ICD-10-CM

## 2019-03-16 DIAGNOSIS — K5903 Drug induced constipation: Secondary | ICD-10-CM

## 2019-03-16 DIAGNOSIS — I952 Hypotension due to drugs: Secondary | ICD-10-CM

## 2019-03-16 LAB — BASIC METABOLIC PANEL
Anion gap: 11 (ref 5–15)
BUN: 20 mg/dL (ref 8–23)
CO2: 31 mmol/L (ref 22–32)
Calcium: 8.6 mg/dL — ABNORMAL LOW (ref 8.9–10.3)
Chloride: 92 mmol/L — ABNORMAL LOW (ref 98–111)
Creatinine, Ser: 0.95 mg/dL (ref 0.61–1.24)
GFR calc Af Amer: >60 mL/min (ref >60)
GFR calc non Af Amer: >60 mL/min (ref >60)
Glucose, Bld: 72 mg/dL (ref 70–99)
Potassium: 3.3 mmol/L — ABNORMAL LOW (ref 3.5–5.1)
Sodium: 134 mmol/L — ABNORMAL LOW (ref 135–145)

## 2019-03-16 MED ORDER — POLYETHYLENE GLYCOL 3350 17 G PO PACK
17.0000 g | PACK | Freq: Every day | ORAL | Status: DC
  Administered 2019-03-16 – 2019-03-21 (×5): 17 g via ORAL
  Filled 2019-03-16 (×7): qty 1

## 2019-03-16 MED ORDER — LISINOPRIL 5 MG PO TABS
15.0000 mg | ORAL_TABLET | Freq: Every day | ORAL | Status: DC
  Administered 2019-03-17 – 2019-03-22 (×6): 15 mg
  Filled 2019-03-16 (×6): qty 1

## 2019-03-16 MED ORDER — LISINOPRIL 5 MG PO TABS: 15.0000 mg | ORAL_TABLET | Freq: Every day | ORAL | Status: DC

## 2019-03-16 MED ORDER — METOLAZONE 2.5 MG PO TABS
10.0000 mg | ORAL_TABLET | Freq: Every day | ORAL | Status: DC
  Administered 2019-03-16 – 2019-03-22 (×7): 10 mg via ORAL
  Filled 2019-03-16: qty 1
  Filled 2019-03-16 (×2): qty 4
  Filled 2019-03-16: qty 2
  Filled 2019-03-16 (×5): qty 4

## 2019-03-16 NOTE — Plan of Care (Signed)
  Problem: Consults Goal: RH GENERAL PATIENT EDUCATION Description: See Patient Education module for education specifics. Outcome: Progressing   Problem: RH BOWEL ELIMINATION Goal: RH STG MANAGE BOWEL WITH ASSISTANCE Description: STG Manage Bowel with Assistance. Outcome: Progressing Goal: RH STG MANAGE BOWEL W/MEDICATION W/ASSISTANCE Description: STG Manage Bowel with Medication with Assistance. Outcome: Progressing   Problem: RH BLADDER ELIMINATION Goal: RH STG MANAGE BLADDER WITH ASSISTANCE Description: STG Manage Bladder With Assistance Outcome: Progressing Goal: RH STG MANAGE BLADDER WITH EQUIPMENT WITH ASSISTANCE Description: STG Manage Bladder With Equipment With Assistance Outcome: Progressing   Problem: RH SKIN INTEGRITY Goal: RH STG SKIN FREE OF INFECTION/BREAKDOWN Outcome: Progressing Goal: RH STG MAINTAIN SKIN INTEGRITY WITH ASSISTANCE Description: STG Maintain Skin Integrity With Assistance. Outcome: Progressing   Problem: RH SAFETY Goal: RH STG ADHERE TO SAFETY PRECAUTIONS W/ASSISTANCE/DEVICE Description: STG Adhere to Safety Precautions With Assistance/Device. Outcome: Progressing

## 2019-03-16 NOTE — Progress Notes (Signed)
Physical Therapy Session Note  Patient Details  Name: Charles Gray MRN: [id number] Date of Birth: [birth year]  Today's Date: 03/16/2019 PT Individual Time: 1000-1100 and 1400-1500 PT Individual Time Calculation (min): 60 min and 60 min    Short Term Goals: Week 1:  PT Short Term Goal 1 (Week 1): pt will transfer stand<>pivot with LRAD CGA PT Short Term Goal 2 (Week 1): Pt will ambulate 63f with LRAD min A PT Short Term Goal 3 (Week 1): pt will perform car transfer with LRAD CGA  Skilled Therapeutic Interventions/Progress Updates:   Treatment Session 1: 1000-1100 60 min Received pt supine in bed asleep, when woken up pt agreeable to therapy, and stated pain 6/10 in his legs and buttocks. Pt continues to report that his butt is sore from sitting in the bed and in the chair despite pillow arrangements. Pt performed bed mobility with supervision with HOB elevated and transferred sit<>stand with RW CGA. Pt ambulated 151fx 2 to bathroom with RW min A. Pt performed hygiene and LE clothing management with CGA. [facility]. Pt performed standing dynamic balance playing cornhole x2 trials without UE support CGA. Pt required multiple seated rest breaks throughout activity due to increased burning sensation in bilateral LE's. Pt navigated 4 steps x 2 trials with bilateral rails CGA ascending and descending with a step to pattern. Pt reported fatigue after activity and required 3 minute seated rest break. Pt reported minor dizziness after activity. Pt performed bed mobility with supervision with HOB elevated. Pt reported needing to use the bathroom again and ambulated 1045f 2 with RW min A to bathroom and performed LE clothing management and hygiene with CGA. Concluded session with pt sitting EOB getting labs done.   Treatment Session 2: 1400-1500 60 min Received pt supine in bed, pt agreeable to therapy, and stated pain in LE's but did not state pain level. RN  notified and stated pt received pain medication 30 minutes prior to therapy. Session focused on functional mobility/transfers, ambulation, LE strengthening, balance/coordination, and improved endurance. Pt performed bed mobility with supervision with HOB elevated. Pt ambulated 18f2f2 with RW CGA to bathroom. Pt performed LE clothing management and hygiene with CGA. PT ace wrapped bilateral LE's since ted hose still not available. Pt ambulated 106ft37fh RW CGA. Pt ambulated 15ft 85f RW CGA to Nustep. On Nustep pt performed bilateral LE strengthening at workload 4 for 4 minutes for 133 total steps. Pt reported muscle "burning" in quadriceps after activity. Pt educated on muscle soreness versus pain. Pt reported 7/10 fatigue after activity. On Kinetron at 15cm/sec pt performed LE strengthening with back support focusing on quad activation and LE strengthening without back support focusing on glute activation x1 minute each.  Concluded session with pt supine in bed, needs within reach, bed alarm on, and repositioned in bed with pillows underneath buttocks for pressure relief.   Therapy Documentation Precautions:  Precautions Precautions: Fall Precaution Comments: monitor SpO2 with activity Restrictions Weight Bearing Restrictions: No   Therapy/Group: Individual Therapy Abrea Henle MAlfonse AlpersPT   03/16/2019, 7:39 AM

## 2019-03-16 NOTE — Progress Notes (Addendum)
Occupational Therapy Session Note  Patient Details  Name: Charles Gray MRN: [id number] Date of Birth: 10-12-1955  Today's Date: 03/16/2019 OT Individual Time: 7989-2119 OT Individual Time Calculation (min): 98 min    Short Term Goals: Week 1:  OT Short Term Goal 1 (Week 1): Pt will complete LB dressing with min assist OT Short Term Goal 2 (Week 1): Pt will complete toilet tranfers with Supervision with LRAD OT Short Term Goal 3 (Week 1): Pt will complete 2 grooming tasks in standing with supervision to demonstrate increased endurance/activity tolerance OT Short Term Goal 4 (Week 1): Pt will complete bathing with min assist  Skilled Therapeutic Interventions/Progress Updates:    Upon entering the room, pt seated in wheelchair with no c/o pain and agreeable to OT intervention. Pt declined toileting this session.BP results of 103/51 while seated on EOB and later checked after standing to be 113/59. Pt standing with min A and ambulating to dresser with min guard and use of RW to obtain clothing items from [employer]. Pt returning to sit on EOB to don clothing items with supervision for UB and min A for standing balance with LB clothing management. Pt performing grooming tasks with supervision and ambulating 128' with RW and CGA. Pt needing 2 standing rest breaks. Pt taking seated rest and OT starting education for energy conservation. Pt then ambulating 100' back towards room in same manner. Pt seated in wheelchair and propelling self back to room with B UEs. Pt transferred to bed with min guard and RW with call bell and all needed items within reach upon exiting the room.   Therapy Documentation Precautions:  Precautions Precautions: Fall Precaution Comments: monitor SpO2 with activity Restrictions Weight Bearing Restrictions: No ADL: ADL Grooming: Setup, Supervision/safety Where Assessed-Grooming: Sitting at sink Upper Body Bathing: Setup, Supervision/safety Where Assessed-Upper Body  Bathing: Sitting at sink Lower Body Bathing: Minimal assistance Where Assessed-Lower Body Bathing: Sitting at sink, Standing at sink Upper Body Dressing: Minimal assistance Where Assessed-Upper Body Dressing: Sitting at sink Toileting: Contact guard Where Assessed-Toileting: Glass blower/designer: Psychiatric nurse Method: Counselling psychologist: Grab bars   Therapy/Group: Individual Therapy  Gypsy Decant 03/16/2019, 12:55 PM

## 2019-03-16 NOTE — Progress Notes (Signed)
Charles Gray PHYSICAL MEDICINE & REHABILITATION PROGRESS NOTE  Subjective/Complaints: Patient seen sitting up at the edge of his bed this morning.  Good sitting balance noted.  Working with therapies.  He states he slept well overnight.  Reported pain by therapies yesterday, however patient only notes mild intermittent left iliac pain which she associates with constipation.  Discussed low blood pressures with therapies as well.  ROS: +Mild intermittent left iliac pain, constipation. Denies CP, shortness of breath, nausea, vomiting, diarrhea.  Objective: Vital Signs: Blood pressure (!) 97/43, pulse 60, temperature 97.8 F (36.6 C), temperature source Oral, resp. rate 16, height 6' 10"  (2.083 m), weight (!) 185.4 kg, SpO2 100 %. Vas [place] Lower Extremity Venous (dvt)  Result Date: 03/14/2019  Lower Venous Study Indications: Swelling.  Limitations: Poor ultrasound/tissue interface and body habitus. Comparison Study: no prior Performing Technologist: Abram Sander RVS  Examination Guidelines: A complete evaluation includes B-mode imaging, spectral Doppler, color Doppler, and power Doppler as needed of all accessible portions of each vessel. Bilateral testing is considered an integral part of a complete examination. Limited examinations for reoccurring indications may be performed as noted.  +---------+---------------+---------+-----------+----------+--------------+ RIGHT    CompressibilityPhasicitySpontaneityPropertiesThrombus Aging +---------+---------------+---------+-----------+----------+--------------+ CFV      Full           Yes      Yes                                 +---------+---------------+---------+-----------+----------+--------------+ SFJ      Full                                                        +---------+---------------+---------+-----------+----------+--------------+ FV Prox  Full                                                         +---------+---------------+---------+-----------+----------+--------------+ FV Mid   Full                                                        +---------+---------------+---------+-----------+----------+--------------+ FV DistalFull                                                        +---------+---------------+---------+-----------+----------+--------------+ PFV      Full                                                        +---------+---------------+---------+-----------+----------+--------------+ POP      Full           Yes      Yes                                 +---------+---------------+---------+-----------+----------+--------------+  PTV                                                   Not visualized +---------+---------------+---------+-----------+----------+--------------+ PERO                                                  Not visualized +---------+---------------+---------+-----------+----------+--------------+   +---------+---------------+---------+-----------+----------+--------------+ LEFT     CompressibilityPhasicitySpontaneityPropertiesThrombus Aging +---------+---------------+---------+-----------+----------+--------------+ CFV      Full           Yes      Yes                                 +---------+---------------+---------+-----------+----------+--------------+ SFJ      Full                                                        +---------+---------------+---------+-----------+----------+--------------+ FV Prox  Full                                                        +---------+---------------+---------+-----------+----------+--------------+ FV Mid   Full                                                        +---------+---------------+---------+-----------+----------+--------------+ FV DistalFull                                                         +---------+---------------+---------+-----------+----------+--------------+ PFV      Full                                                        +---------+---------------+---------+-----------+----------+--------------+ POP      Full           Yes      Yes                                 +---------+---------------+---------+-----------+----------+--------------+ PTV                                                   Not visualized +---------+---------------+---------+-----------+----------+--------------+ PERO  Not visualized +---------+---------------+---------+-----------+----------+--------------+     Summary: Right: There is no evidence of deep vein thrombosis in the lower extremity. However, portions of this examination were limited- see technologist comments above. No cystic structure found in the popliteal fossa. Left: There is no evidence of deep vein thrombosis in the lower extremity. However, portions of this examination were limited- see technologist comments above. No cystic structure found in the popliteal fossa.  *See table(s) above for measurements and observations. Electronically signed by Deitra Mayo MD on 03/14/2019 at 12:20:11 PM.    Final    Recent Labs    03/14/19 0447  WBC 9.7  HGB 8.5*  HCT 27.0*  PLT 144*   Recent Labs    03/14/19 0447  NA 135  K 3.4*  CL 95*  CO2 30  GLUCOSE 91  BUN 21  CREATININE 0.90  CALCIUM 8.2*    Physical Exam: BP (!) 97/43 (BP Location: Right Arm)   Pulse 60   Temp 97.8 F (36.6 C) (Oral)   Resp 16   Ht 6' 10"  (2.083 m)   Wt (!) 185.4 kg   SpO2 100%   BMI 42.74 kg/m  Constitutional: No distress . Vital signs reviewed.  Morbid obesity. HENT: Normocephalic.  Atraumatic. Eyes: EOMI. No discharge. Cardiovascular: No JVD. Respiratory: Normal effort.  No stridor. GI: Non-distended. Skin: Vascular changes bilateral lower extremities. Psych: Normal mood.   Normal behavior. Musc: Bilateral lower extremity pitting edema. Neurological:  Alert Makes good eye contact follows commands.   Motor: Right upper extremity: 4+/5 proximal distal Left upper extremity: 5/5 proximal distal Right lower extremity: 4-/5 proximal distal, unchanged Left lower extremity: 4/5 proximal to distal, improving  Assessment/Plan: 1. Functional deficits secondary to debility which require 3+ hours per day of interdisciplinary therapy in a comprehensive inpatient rehab setting.  Physiatrist is providing close team supervision and 24 hour management of active medical problems listed below.  Physiatrist and rehab team continue to assess barriers to discharge/monitor patient progress toward functional and medical goals  Care Tool:  Bathing    Body parts bathed by patient: Right arm, Left arm, Chest, Abdomen, Front perineal area, Right upper leg, Left upper leg, Face, Buttocks   Body parts bathed by helper: Buttocks, Right lower leg, Left lower leg     Bathing assist Assist Level: Minimal Assistance - Patient > 75%     Upper Body Dressing/Undressing Upper body dressing   What is the patient wearing?: Pull over shirt    Upper body assist Assist Level: Set up assist    Lower Body Dressing/Undressing Lower body dressing      What is the patient wearing?: Pants     Lower body assist Assist for lower body dressing: Contact Guard/Touching assist     Toileting Toileting    Toileting assist Assist for toileting: Contact Guard/Touching assist     Transfers Chair/bed transfer  Transfers assist     Chair/bed transfer assist level: Minimal Assistance - Patient > 75%     Locomotion Ambulation   Ambulation assist      Assist level: Independent with assistive device Assistive device: Walker-rolling Max distance: 67f   Walk 10 feet activity   Assist     Assist level: Minimal Assistance - Patient > 75% Assistive device: Walker-rolling    Walk 50 feet activity   Assist Walk 50 feet with 2 turns activity did not occur: Safety/medical concerns  Assist level: Minimal Assistance - Patient > 75% Assistive device: Walker-rolling    Walk  150 feet activity   Assist Walk 150 feet activity did not occur: Safety/medical concerns         Walk 10 feet on uneven surface  activity   Assist Walk 10 feet on uneven surfaces activity did not occur: Safety/medical concerns         Wheelchair     Assist Will patient use wheelchair at discharge?: Yes Type of Wheelchair: Manual    Wheelchair assist level: Supervision/Verbal cueing Max wheelchair distance: 16f    Wheelchair 50 feet with 2 turns activity    Assist        Assist Level: Supervision/Verbal cueing   Wheelchair 150 feet activity     Assist Wheelchair 150 feet activity did not occur: Safety/medical concerns   Assist Level: Supervision/Verbal cueing      Medical Problem List and Plan: 1.  Debility secondary to GI bleed/hypovolemic/hemorrhagic shock/acute hypoxic respiratory failure. Self extubated 03/06/2019.  Continue CIR 2.  Antithrombotics: -DVT/anticoagulation: SCDs.    Vascular study reviewed, negative for DVT             -antiplatelet therapy: N/A 3. Pain Management/chronic pain management: Cymbalta 30 mg daily, Suboxone 2 tablets daily, Neurontin 800 mg every 8 hours  Controlled on 12/3 4. Mood: Provide emotional support             -antipsychotic agents: N/A 5. Neuropsych: This patient is capable of making decisions on his own behalf. 6. Skin/Wound Care: Routine skin checks 7. Fluids/Electrolytes/Nutrition: Routine in and outs 8. Hypertension.  Lasix as directed  Zaroxolyn 5 mg daily, increased to 10 on 12/3  Lisinopril 30 mg daily, decreased to 15 on 12/3  Relatively low on 12/3  Monitor with increased mobility 9. GI bleed.  Follow-up gastroenterology services.  Plan EGD follow-up 2 weeks (~12/15).  Continue Protonix  twice daily 10. Morbid obesity.  BMI 42.60.   11. Cirrhosis/hepatic steatosis.    LFTs within normal limits on 12/1  Continue to monitor 12. Thrombocytopenia/anemia.  Felt to be related to cirrhosis.    Hemoglobin 8.5 on 12/1, labs ordered for tomorrow  Platelets 144 on 12/1, labs ordered for tomorrow  Continue to monitor 13. Hyperlipidemia.  Pravachol 14. Hypokalemia  Potassium 3.4 on 12/1, labs pending  Supplemented x1  Continue to monitor 15. Hypoalbuminemia  Supplement initiated on 12/1 16. Lower extremity edema-chronic  Cont lasix  Zaroxolyn increased on 12/3 Filed Weights   03/13/19 1711 03/15/19 0600 03/16/19 0700  Weight: (!) 191.4 kg (!) 186.5 kg (!) 185.4 kg  17.  Drug-induced constipation  Bowel meds scheduled on 12/3  LOS: 3 days A FACE TO FACE EVALUATION WAS PERFORMED  Velencia Lenart ALorie Phenix12/06/2018, 8:36 AM

## 2019-03-17 ENCOUNTER — Inpatient Hospital Stay (HOSPITAL_COMMUNITY): Payer: Medicare Other

## 2019-03-17 ENCOUNTER — Inpatient Hospital Stay (HOSPITAL_COMMUNITY): Payer: Medicare Other | Admitting: Occupational Therapy

## 2019-03-17 DIAGNOSIS — I10 Essential (primary) hypertension: Secondary | ICD-10-CM

## 2019-03-17 DIAGNOSIS — E871 Hypo-osmolality and hyponatremia: Secondary | ICD-10-CM

## 2019-03-17 LAB — CBC WITH DIFFERENTIAL/PLATELET
Abs Immature Granulocytes: 0.04 10*3/uL (ref 0.00–0.07)
Basophils Absolute: 0 10*3/uL (ref 0.0–0.1)
Basophils Relative: 0 %
Eosinophils Absolute: 0.2 10*3/uL (ref 0.0–0.5)
Eosinophils Relative: 2 %
HCT: 29.9 % — ABNORMAL LOW (ref 39.0–52.0)
Hemoglobin: 9.1 g/dL — ABNORMAL LOW (ref 13.0–17.0)
Immature Granulocytes: 1 %
Lymphocytes Relative: 13 %
Lymphs Abs: 0.9 10*3/uL (ref 0.7–4.0)
MCH: 27.9 pg (ref 26.0–34.0)
MCHC: 30.4 g/dL (ref 30.0–36.0)
MCV: 91.7 fL (ref 80.0–100.0)
Monocytes Absolute: 0.7 10*3/uL (ref 0.1–1.0)
Monocytes Relative: 9 %
Neutro Abs: 5.5 10*3/uL (ref 1.7–7.7)
Neutrophils Relative %: 75 %
Platelets: 174 10*3/uL (ref 150–400)
RBC: 3.26 MIL/uL — ABNORMAL LOW (ref 4.22–5.81)
RDW: 15.5 % (ref 11.5–15.5)
WBC: 7.3 10*3/uL (ref 4.0–10.5)
nRBC: 0 % (ref 0.0–0.2)

## 2019-03-17 MED ORDER — POTASSIUM CHLORIDE CRYS ER 20 MEQ PO TBCR
30.0000 meq | EXTENDED_RELEASE_TABLET | Freq: Every day | ORAL | Status: DC
  Administered 2019-03-17 – 2019-03-21 (×5): 30 meq via ORAL
  Filled 2019-03-17 (×5): qty 1

## 2019-03-17 NOTE — Progress Notes (Signed)
Occupational Therapy Session Note  Patient Details  Name: Charles Gray MRN: [id number] Date of Birth: Oct 29, 1955  Today's Date: 03/17/2019  Session 1 OT Individual Time: 9675-9163 OT Individual Time Calculation (min): 60 min   Session 2 OT Individual Time: 1300-1400 OT Individual Time Calculation (min): 60 min    Short Term Goals: Week 1:  OT Short Term Goal 1 (Week 1): Pt will complete LB dressing with min assist OT Short Term Goal 2 (Week 1): Pt will complete toilet tranfers with Supervision with LRAD OT Short Term Goal 3 (Week 1): Pt will complete 2 grooming tasks in standing with supervision to demonstrate increased endurance/activity tolerance OT Short Term Goal 4 (Week 1): Pt will complete bathing with min assist  Skilled Therapeutic Interventions/Progress Updates:  Session 1   Pt greeted seated EOB and agreeable to OT treatment session. OT provided pt with seat cushion for wc as pt stated his bottom has been a little sore. Pt declined bathing/dressing today as he showered yesterday. Pt ambulated in 15 feet into hallway with RW and wc follow. Pt took seated rest break, then ambulated another 15 ft with CGA and min A to stand from wc. Pt brought to therapy gym and completed stand-pivot to therapy mat with supervision. Worked on UB and core strengthening with ball toss and abdominal twist. Pt with difficulty achieving trunk rotation. UB there-ex with 2lb dowel rod. 2 sets of 10 chest press, straight arm raises, and bicep curl. Pt reported need to urinate. Stand-pivot to wc with  CGA. OT pushed pt back to room in wc 2/2 urgency. Pt ambulated 5 ft w/ RW and CGA into bathroom and sat on regular commode. Pt sat down and voided bladder. CGA to stand with RW, then pt able to manage clothing. Pt ambulated out of bathroom in similar fashion and left seated in wc with chair alarm on and needs met.   Session 2 Pt greeted seated in wc and agreeable to OT treatment session. Pt had finished lunch  and stated he had just returned from the bathroom. Pt ambulated 40 feet x2 with extended rest break in between bouts of ambulation. OT pushed pt to ortho gym in wc while he rested. UB strengthening/endurance with [employer] arm bike on level 3 with RPMs above 35. 5 mins x2 with rest breaks. Pt reported feeling "doped up" all day, but unable to explain further. OT asked if it was dizziness, and he said :"kind of." SpO2 100% and HR 79. Pt brought to therapy gym and worked on balance standing on biodex. Focus on finding center of balance and ankle/hip balance strategies. PT reported feeling LEs burning and had to sit. OT re moved ace wraps and noted shins and calf redness. Pt returned to room and ambulated 5 feet back to b ed with RW and CGA. Pt left seated EOB with needs met and bed alarm on.  Therapy Documentation Precautions:  Precautions Precautions: Fall Precaution Comments: monitor SpO2 with activity Restrictions Weight Bearing Restrictions: No Pain: Pain Assessment Pain Scale: 0-10 Pain Score: 7  Pain Type: Chronic pain Pain Location: Back Pain Orientation: Lower Pain Descriptors / Indicators: Aching Pain Frequency: Constant Pain Onset: On-going Patients Stated Pain Goal: 5 Pain Intervention(s):;Relaxation   Therapy/Group: Individual Therapy  Valma Cava 03/17/2019, 3:21 PM

## 2019-03-17 NOTE — Progress Notes (Signed)
Physical Therapy Session Note  Patient Details  Name: Charles Gray MRN: [id number] Date of Birth: 07/05/1955  Today's Date: 03/17/2019 PT Individual Time: 3382-5053 PT Individual Time Calculation (min): 74 min   Short Term Goals: Week 1:  PT Short Term Goal 1 (Week 1): pt will transfer stand<>pivot with LRAD CGA PT Short Term Goal 2 (Week 1): Pt will ambulate 22f with LRAD min A PT Short Term Goal 3 (Week 1): pt will perform car transfer with LRAD CGA  Skilled Therapeutic Interventions/Progress Updates:   Received pt sitting in WC, pt agreeable to therapy, and stated pain 6/10 in bilateral arms; reporting he woke up in the middle of the night with cramps in his arms. Pt declined pain medication at the time. Session focused on functional mobility/transfers, ambulation, WC mobility, standing balance, LE strength, and improved activity tolerance. PT ace wrapped bilateral LE's since ted hose still not available. Pt performed WC mobility 1061fwith bilateral UE's supervision. Pt reported minor dizziness and stated that he felt drowsy from all the medication he received and stated he didn't eat much this morning because he didn't like the food. BP 106/53 with HR 70bpm while sitting in WC prior to ambulation. Pt ambulated 10069fith RW CGA. Pt continues to demonstrate flexed trunk posture but states his LBP increases when he attempts to stand upright. Pt performed standing alternating toe taps to 3 cones 2x5 each LE with bilateral UE support on RW CGA. Pt required multiple rest and water breaks throughout session. Pt stood on Airex with bilateral UE support on RW 45 seconds x 1 and 30 seconds x 1 with perturbations CGA. Pt performed sit<>stand 2x5 on Airex without UE support CGA. Pt performed seated bilateral hip flexion and knee extension 1x10 with verbal cues to technique. Pt reported fatigue 7.5/10 after activity. Pt ambulated 5ft90fth RW CGA [facility] Concluded  session with pt sitting in WC, needs within reach, and chair pad alarm on.    Therapy Documentation Precautions:  Precautions Precautions: Fall Precaution Comments: monitor SpO2 with activity Restrictions Weight Bearing Restrictions: No  Therapy/Group: Individual Therapy AnnaAlfonse Alpers DPT   03/17/2019, 7:35 AM

## 2019-03-17 NOTE — Plan of Care (Signed)
  Problem: Consults Goal: RH GENERAL PATIENT EDUCATION Description: See Patient Education module for education specifics. Outcome: Progressing   Problem: RH BOWEL ELIMINATION Goal: RH STG MANAGE BOWEL WITH ASSISTANCE Description: STG Manage Bowel with min Assistance. Outcome: Progressing Had small BM this morning after meds given.  Goal: RH STG MANAGE BOWEL W/MEDICATION W/ASSISTANCE Description: STG Manage Bowel with Medication with mod I Assistance. Outcome: Progressing Offered pt prune juice or coffee and pt drank coffee, stated that normally will help his bowels move.    Problem: RH BLADDER ELIMINATION Goal: RH STG MANAGE BLADDER WITH ASSISTANCE Description: STG Manage Bladder With min Assistance Outcome: Progressing Goal: RH STG MANAGE BLADDER WITH EQUIPMENT WITH ASSISTANCE Description: STG Manage Bladder With Equipment With supervision Assistance Outcome: Progressing   Problem: RH SKIN INTEGRITY Goal: RH STG SKIN FREE OF INFECTION/BREAKDOWN Description: Pt remain free of skin breakdown or infection while on CIR with min assist  Outcome: Progressing Goal: RH STG MAINTAIN SKIN INTEGRITY WITH ASSISTANCE Description: STG Maintain Skin Integrity With min Assistance. Outcome: Progressing   Problem: RH SAFETY Goal: RH STG ADHERE TO SAFETY PRECAUTIONS W/ASSISTANCE/DEVICE Description: STG Adhere to Safety Precautions With cues/reminders Assistance/Device. Outcome: Progressing

## 2019-03-17 NOTE — Progress Notes (Signed)
[place] PHYSICAL MEDICINE & REHABILITATION PROGRESS NOTE  Subjective/Complaints: Patient seen sitting up in bed this morning.  He states he slept well overnight.  He questions regarding diagnosis of cirrhosis.  Discussed blood pressure with patient, who notes improvement this AM.  ROS: Denies CP, shortness of breath, nausea, vomiting, diarrhea.  Objective: Vital Signs: Blood pressure 129/71, pulse 73, temperature 98 F (36.7 C), temperature source Oral, resp. rate 18, height 6' 10"  (2.083 m), weight (!) 186.6 kg, SpO2 97 %. No results found. Recent Labs    03/17/19 0530  WBC 7.3  HGB 9.1*  HCT 29.9*  PLT 174   Recent Labs    03/16/19 1106  NA 134*  K 3.3*  CL 92*  CO2 31  GLUCOSE 72  BUN 20  CREATININE 0.95  CALCIUM 8.6*    Physical Exam: BP 129/71 (BP Location: Right Arm)   Pulse 73   Temp 98 F (36.7 C) (Oral)   Resp 18   Ht 6' 10"  (2.083 m)   Wt (!) 186.6 kg   SpO2 97%   BMI 43.01 kg/m  Constitutional: No distress . Vital signs reviewed.  Morbid obesity. HENT: Normocephalic.  Atraumatic. Eyes: EOMI. No discharge. Cardiovascular: No JVD. Respiratory: Normal effort.  No stridor. GI: Non-distended. Skin: Vascular changes bilateral lower extremities. Psych: Normal mood.  Normal behavior. Musc: Bilateral lower extremities with pitting edema. Neurological:  Alert Makes good eye contact follows commands.   Motor: Right upper extremity: 4+/5 proximal distal Left upper extremity: 5/5 proximal distal Right lower extremity: 4-/5 proximal distal, stable Left lower extremity: 4/5 proximal to distal, stable  Assessment/Plan: 1. Functional deficits secondary to debility which require 3+ hours per day of interdisciplinary therapy in a comprehensive inpatient rehab setting.  Physiatrist is providing close team supervision and 24 hour management of active medical problems listed below.  Physiatrist and rehab team continue to assess barriers to discharge/monitor  patient progress toward functional and medical goals  Care Tool:  Bathing    Body parts bathed by patient: Right arm, Left arm, Chest, Abdomen, Front perineal area, Right upper leg, Left upper leg, Face, Buttocks   Body parts bathed by helper: Buttocks, Right lower leg, Left lower leg     Bathing assist Assist Level: Minimal Assistance - Patient > 75%     Upper Body Dressing/Undressing Upper body dressing   What is the patient wearing?: Pull over shirt    Upper body assist Assist Level: Supervision/Verbal cueing    Lower Body Dressing/Undressing Lower body dressing      What is the patient wearing?: Pants, Underwear/pull up     Lower body assist Assist for lower body dressing: Contact Guard/Touching assist     Toileting Toileting    Toileting assist Assist for toileting: Contact Guard/Touching assist     Transfers Chair/bed transfer  Transfers assist     Chair/bed transfer assist level: Contact Guard/Touching assist     Locomotion Ambulation   Ambulation assist      Assist level: Contact Guard/Touching assist Assistive device: Walker-rolling Max distance: 128'   Walk 10 feet activity   Assist     Assist level: Contact Guard/Touching assist Assistive device: Walker-rolling   Walk 50 feet activity   Assist Walk 50 feet with 2 turns activity did not occur: Safety/medical concerns  Assist level: Contact Guard/Touching assist Assistive device: Walker-rolling    Walk 150 feet activity   Assist Walk 150 feet activity did not occur: Safety/medical concerns  Walk 10 feet on uneven surface  activity   Assist Walk 10 feet on uneven surfaces activity did not occur: Safety/medical concerns         Wheelchair     Assist Will patient use wheelchair at discharge?: Yes Type of Wheelchair: Manual    Wheelchair assist level: Supervision/Verbal cueing Max wheelchair distance: 165f    Wheelchair 50 feet with 2 turns  activity    Assist        Assist Level: Supervision/Verbal cueing   Wheelchair 150 feet activity     Assist Wheelchair 150 feet activity did not occur: Safety/medical concerns   Assist Level: Supervision/Verbal cueing      Medical Problem List and Plan: 1.  Debility secondary to GI bleed/hypovolemic/hemorrhagic shock/acute hypoxic respiratory failure. Self extubated 03/06/2019.  Continue CIR 2.  Antithrombotics: -DVT/anticoagulation: SCDs.    Vascular study reviewed, negative for DVT             -antiplatelet therapy: N/A 3. Pain Management/chronic pain management: Cymbalta 30 mg daily, Suboxone 2 tablets daily, Neurontin 800 mg every 8 hours  Controlled on 12/4 4. Mood: Provide emotional support             -antipsychotic agents: N/A 5. Neuropsych: This patient is capable of making decisions on his own behalf. 6. Skin/Wound Care: Routine skin checks 7. Fluids/Electrolytes/Nutrition: Routine in and outs 8. Hypertension.  Lasix as directed  Zaroxolyn 5 mg daily, increased to 10 on 12/3  Lisinopril 30 mg daily, decreased to 15 on 12/3  Controlled on 12/4  Monitor with increased mobility 9. GI bleed.  Follow-up gastroenterology services.  Plan EGD follow-up 2 weeks (~12/15).  Continue Protonix twice daily 10. Morbid obesity.  BMI 42.60.   11. Cirrhosis/hepatic steatosis.    LFTs within normal limits on 12/1  Continue to monitor 12. Thrombocytopenia/anemia.  Felt to be related to cirrhosis.    Hemoglobin 9.1 on 12/4  Platelets 174 on 12/4  Continue to monitor 13. Hyperlipidemia.  Pravachol 14. Hypokalemia  Potassium 3.3 on 12/3, labs ordered for Monday  Supplemented initiated  Continue to monitor 15. Hypoalbuminemia  Supplement initiated on 12/1 16. Lower extremity edema-chronic  Cont lasix  Zaroxolyn increased on 12/3 Filed Weights   03/15/19 0600 03/16/19 0700 03/17/19 0516  Weight: (!) 186.5 kg (!) 185.4 kg (!) 186.6 kg   Stable on 12/4 17.   Drug-induced constipation  Bowel meds scheduled on 12/3 18.  Hyponatremia  Sodium 134 on 12/3  Labs ordered for Monday  LOS: 4 days A FACE TO FACE EVALUATION WAS PERFORMED  Shan Valdes ALorie Phenix12/07/2018, 9:27 AM

## 2019-03-18 ENCOUNTER — Inpatient Hospital Stay (HOSPITAL_COMMUNITY): Payer: Medicare Other | Admitting: Physical Therapy

## 2019-03-18 ENCOUNTER — Inpatient Hospital Stay (HOSPITAL_COMMUNITY): Payer: Medicare Other | Admitting: Occupational Therapy

## 2019-03-18 LAB — MAGNESIUM: Magnesium: 1.9 mg/dL (ref 1.7–2.4)

## 2019-03-18 MED ORDER — BISACODYL 10 MG RE SUPP
10.0000 mg | Freq: Once | RECTAL | Status: DC
  Filled 2019-03-18: qty 1

## 2019-03-18 MED ORDER — GUAIFENESIN ER 600 MG PO TB12: 600.0000 mg | ORAL_TABLET | Freq: Two times a day (BID) | ORAL | Status: DC

## 2019-03-18 MED ORDER — SENNOSIDES-DOCUSATE SODIUM 8.6-50 MG PO TABS
2.0000 | ORAL_TABLET | Freq: Two times a day (BID) | ORAL | Status: DC
  Administered 2019-03-18 – 2019-03-22 (×9): 2 via ORAL
  Filled 2019-03-18 (×10): qty 2

## 2019-03-18 NOTE — Progress Notes (Signed)
Patient has some MASD to the coccyx area. The area is blanchable at this time, tender to the touch. Area was cleansed with normal saline & skin barrier was applied. Patient has a lot of pillows on his bed trying to make it softer. He stated that his tailbone was hurting from sitting so much & he uses a pillow in his wheelchair. He is ambulatory & uses a platform walker to ambulate as well as the wheelchair. Will continue to monitor.

## 2019-03-18 NOTE — Progress Notes (Signed)
Physical Therapy Session Note  Patient Details  Name: Charles Gray MRN: [id number] Date of Birth: 1955-05-14  Today's Date: 03/18/2019 PT Individual Time: 0801-0858 PT Individual Time Calculation (min): 57 min   Short Term Goals: Week 1:  PT Short Term Goal 1 (Week 1): pt will transfer stand<>pivot with LRAD CGA PT Short Term Goal 2 (Week 1): Pt will ambulate 12f with LRAD min A PT Short Term Goal 3 (Week 1): pt will perform car transfer with LRAD CGA  Skilled Therapeutic Interventions/Progress Updates:  Pt was seen bedside in the am. Donned B ace wraps at edge of bed. Pt performed multiple sit to stand transfers with S and stand pivot transfers with rolling walker and c/g. Pt propelled w/c about 150 feet with B UEs and S with increased time. Pt ascended/descended 8 stairs with B rails x 2 with S and verbal cues. Pt ambulated 10 feet x 2 with rolling walker and S. Pt performed toilet transfers with grab bars and S. Pt left sitting on edge of bed with all needs within reach.   Therapy Documentation Precautions:  Precautions Precautions: Fall Precaution Comments: monitor SpO2 with activity Restrictions Weight Bearing Restrictions: No General:   Pain: No c/o pain.   Therapy/Group: Individual Therapy  MDub Amis12/08/2018, 12:11 PM

## 2019-03-18 NOTE — Progress Notes (Signed)
Occupational Therapy Session Note  Patient Details  Name: Charles Gray MRN: [id number] Date of Birth: 1956-02-23  Today's Date: 03/18/2019 OT Individual Time: 1340-1440 OT Individual Time Calculation (min): 60 min    Short Term Goals: Week 1:  OT Short Term Goal 1 (Week 1): Pt will complete LB dressing with min assist OT Short Term Goal 2 (Week 1): Pt will complete toilet tranfers with Supervision with LRAD OT Short Term Goal 3 (Week 1): Pt will complete 2 grooming tasks in standing with supervision to demonstrate increased endurance/activity tolerance OT Short Term Goal 4 (Week 1): Pt will complete bathing with min assist  Skilled Therapeutic Interventions/Progress Updates: patient participated as follows:  Toilet transfer via RW to regular toilet (He stated his toilet at home is lower than here and that he will need higher toilet with 'big hole for penis and scrotum to fit into' at home)= distant S;    Toileting= distant S  Kitchen management of accessing stove, microwave. ,refrigierators., funcational mobiity in and out of cabinets and utilizing bag on walker (will benefit from walker tray) with distant S.   He initiated energy conservation and work simplication and sat and broke down steps of tasks when he felt his cardiopulmonary system required it)   O2 was 97 post activities.   He stated he knows when he needs to sit and complete deep inhalations.  Dynamic standing balance for self care and functional activities= distant S.   He rested when he felt he needed the cardiopulmonary breaks.  Today Mr. Mehringer stated two main concerns that he hopes the team can help him with:   1) Carrying delievered groceries from front door to refigerator and kitchen cabinets (Prior to admission, his son ordered them from [place] and he barely was able to put them away...he stated)  2) He cannot use the 700 lb 3:1 to aim his penis into the toilet.  He says he can use the regular seat more  efficiently; however, his toilet seat at home is lower than the one here.  He wonders if there is either a 3:1 or elevated fitted toilet seat that is bigger.   He will benefit from having a slightly deep walker tray or basket for transporting items during fucntional mobility, once home  At end of session, he required to transfer back to bed (close S) to rest and to doff ACE wraps.  He stated they felt tight.   Once on bed with min assist he doffed the wraps.   Skin appeared very dry.   RN informed so that she may assess skin integrity if she thought needed.  Patient left with call bell and phone within reach and RN on her way to view skin.  Continue OT Plan of Care      Therapy Documentation Precautions:  Precautions Precautions: Fall Precaution Comments: monitor SpO2 with activity Restrictions Weight Bearing Restrictions: No  Pain:denied  Therapy/Group: Individual Therapy  Alfredia Ferguson [facility] 03/18/2019, 5:40 PM

## 2019-03-18 NOTE — Progress Notes (Signed)
[place] PHYSICAL MEDICINE & REHABILITATION PROGRESS NOTE  Subjective/Complaints:  "[place] turds yesterday"  Still feels constipated, passing gas, occ LLQ pain ROS: Denies CP, shortness of breath, nausea, vomiting, diarrhea.  Objective: Vital Signs: Blood pressure (!) 119/57, pulse 81, temperature 98.3 F (36.8 C), temperature source Oral, resp. rate 18, height 6' 10"  (2.083 m), weight (!) 177.4 kg, SpO2 100 %. No results found. Recent Labs    03/17/19 0530  WBC 7.3  HGB 9.1*  HCT 29.9*  PLT 174   Recent Labs    03/16/19 1106  NA 134*  K 3.3*  CL 92*  CO2 31  GLUCOSE 72  BUN 20  CREATININE 0.95  CALCIUM 8.6*    Physical Exam: BP (!) 119/57   Pulse 81   Temp 98.3 F (36.8 C) (Oral)   Resp 18   Ht 6' 10"  (2.083 m)   Wt (!) 177.4 kg   SpO2 100%   BMI 40.89 kg/m  Constitutional: No distress . Vital signs reviewed.  Morbid obesity. HENT: Normocephalic.  Atraumatic. Eyes: EOMI. No discharge. Cardiovascular: No JVD. Respiratory: Normal effort.  No stridor. GI: Non-distended. Skin: Vascular changes bilateral lower extremities. Psych: Normal mood.  Normal behavior. Musc: Bilateral lower extremities with pitting edema. Neurological:  Alert Makes good eye contact follows commands.   Motor: Right upper extremity: 4+/5 proximal distal Left upper extremity: 5/5 proximal distal Right lower extremity: 4-/5 proximal distal, stable Left lower extremity: 4/5 proximal to distal, stable  Assessment/Plan: 1. Functional deficits secondary to debility which require 3+ hours per day of interdisciplinary therapy in a comprehensive inpatient rehab setting.  Physiatrist is providing close team supervision and 24 hour management of active medical problems listed below.  Physiatrist and rehab team continue to assess barriers to discharge/monitor patient progress toward functional and medical goals  Care Tool:  Bathing    Body parts bathed by patient: Right arm, Left  arm, Chest, Abdomen, Front perineal area, Right upper leg, Left upper leg, Face, Buttocks   Body parts bathed by helper: Buttocks, Right lower leg, Left lower leg     Bathing assist Assist Level: Minimal Assistance - Patient > 75%     Upper Body Dressing/Undressing Upper body dressing   What is the patient wearing?: Pull over shirt    Upper body assist Assist Level: Supervision/Verbal cueing    Lower Body Dressing/Undressing Lower body dressing      What is the patient wearing?: Pants, Underwear/pull up     Lower body assist Assist for lower body dressing: Contact Guard/Touching assist     Toileting Toileting    Toileting assist Assist for toileting: Contact Guard/Touching assist     Transfers Chair/bed transfer  Transfers assist     Chair/bed transfer assist level: Contact Guard/Touching assist     Locomotion Ambulation   Ambulation assist      Assist level: Contact Guard/Touching assist Assistive device: Walker-rolling Max distance: 14f   Walk 10 feet activity   Assist     Assist level: Contact Guard/Touching assist Assistive device: Walker-rolling   Walk 50 feet activity   Assist Walk 50 feet with 2 turns activity did not occur: Safety/medical concerns  Assist level: Contact Guard/Touching assist Assistive device: Walker-rolling    Walk 150 feet activity   Assist Walk 150 feet activity did not occur: Safety/medical concerns         Walk 10 feet on uneven surface  activity   Assist Walk 10 feet on uneven surfaces activity did not  occur: Safety/medical concerns         Wheelchair     Assist Will patient use wheelchair at discharge?: Yes Type of Wheelchair: Manual    Wheelchair assist level: Supervision/Verbal cueing Max wheelchair distance: 140f    Wheelchair 50 feet with 2 turns activity    Assist        Assist Level: Supervision/Verbal cueing   Wheelchair 150 feet activity     Assist Wheelchair  150 feet activity did not occur: Safety/medical concerns   Assist Level: Supervision/Verbal cueing      Medical Problem List and Plan: 1.  Debility secondary to GI bleed/hypovolemic/hemorrhagic shock/acute hypoxic respiratory failure. Self extubated 03/06/2019.  Continue CIR PT, OT 2.  Antithrombotics: -DVT/anticoagulation: SCDs.    Vascular study reviewed, negative for DVT             -antiplatelet therapy: N/A 3. Pain Management/chronic pain management: Cymbalta 30 mg daily, Suboxone 2 tablets daily, Neurontin 800 mg every 8 hours  Controlled on 12/4 4. Mood: Provide emotional support             -antipsychotic agents: N/A 5. Neuropsych: This patient is capable of making decisions on his own behalf. 6. Skin/Wound Care: Routine skin checks 7. Fluids/Electrolytes/Nutrition: Routine in and outs 8. Hypertension.  Lasix as directed  Zaroxolyn 5 mg daily, increased to 10 on 12/3  Lisinopril 30 mg daily, decreased to 15 on 12/3   Vitals:   03/18/19 0402 03/18/19 0735  BP: 126/66 (!) 119/57  Pulse: 81   Resp: 18   Temp: 98.3 F (36.8 C)   SpO2: 100%   controlled 12/5  Monitor with increased mobility 9. GI bleed.  Follow-up gastroenterology services.  Plan EGD follow-up 2 weeks (~12/15).  Continue Protonix twice daily 10. Morbid obesity.  BMI 42.60.   11. Cirrhosis/hepatic steatosis.    LFTs within normal limits on 12/1  Continue to monitor 12. Thrombocytopenia/anemia.  Felt to be related to cirrhosis.    Hemoglobin 9.1 on 12/4  Platelets 174 on 12/4  Continue to monitor 13. Hyperlipidemia.  Pravachol 14. Hypokalemia  Potassium 3.3 on 12/3, labs ordered for Monday  Supplemented initiated Check serum Mg as well  Continue to monitor 15. Hypoalbuminemia  Supplement initiated on 12/1 16. Lower extremity edema-chronic  Cont lasix  Zaroxolyn increased on 12/3 Filed Weights   03/16/19 0700 03/17/19 0516 03/18/19 0402  Weight: (!) 185.4 kg (!) 186.6 kg (!) 177.4 kg    Stable on 12/4 17.  Drug-induced constipation  Bowel meds increased senna S 2 po BID  18.  Hyponatremia  Sodium 134 on 12/3  Labs ordered for Monday  LOS: 5 days A FACE TO FACE EVALUATION WAS PERFORMED  ACharlett Blake12/08/2018, 10:11 AM

## 2019-03-18 NOTE — Progress Notes (Addendum)
Physical Therapy Session Note  Patient Details  Name: Charles Gray MRN: [id number] Date of Birth: 1955/05/25  Today's Date: 03/18/2019 PT Individual Time: 1006-1100 PT Individual Time Calculation (min): 54 min   Short Term Goals: Week 1:  PT Short Term Goal 1 (Week 1): pt will transfer stand<>pivot with LRAD CGA PT Short Term Goal 2 (Week 1): Pt will ambulate 46f with LRAD min A PT Short Term Goal 3 (Week 1): pt will perform car transfer with LRAD CGA  Skilled Therapeutic Interventions/Progress Updates:  Pt received asleep in bed but easily awakened & agreeable to tx. Pt reports unrated "tailbone" pain from lying in bed, sitting in w/c - pt transferred OOB for participation & repositioning for comfort, pt reports he's premedicated, & educated pt on need to reposition in bed/lay on side to reduce pressure on tailbone. Pt already has ace wraps on BLE from previous PT session. Pt completes bed mobility with mod I & hospital bed features. Pt completes sit>stand with RW & supervision and ambulates in room/bathroom with RW & close supervision with pt demonstrating significant forward trunk flexion/lean on RW. Pt completes toilet transfer with supervision & grab bars, clothing management without assistance, & pt with continent void on toilet. Pt completes hand hygiene standing at sink with close supervision for balance with cuing to square up to sink instead of standing to side of it. Transported pt to gym via w/c dependent assist for time management. Pt transferred sit<>stand with supervision & ambulates 150 ft with RW & close supervision with ongoing cuing but poor demo of upright posture, SpO2 = 97% on room air after ambulating. Pt requests to weigh himself & steps on/off scale with BUE support & close supervision (pt weights 397.2 lbs). Pt ambulates 20 ft + 10 ft without AD with min assist with pt electing to hold BUE behind his back. During all gait pt demonstrates decreased hip & knee flexion BLE,  decreased heel strike BLE & decreased dorsiflexion during swing phase with decreased step length BLE & decreased stride length. In ortho gym, pt stands for 2 minutes with close supervision while engaging in dynavision with task focusing on standing tolerance & BLE strengthening & standing balance with pt reporting BLE fatigue towards end of task. At end of session pt left in w/c with chair alarm donned & all needs at hand.  Therapy Documentation Precautions:  Precautions Precautions: Fall Precaution Comments: monitor SpO2 with activity Restrictions Weight Bearing Restrictions: No      Therapy/Group: Individual Therapy  VWaunita Schooner12/08/2018, 11:03 AM

## 2019-03-18 NOTE — Plan of Care (Signed)
  Problem: Consults Goal: RH GENERAL PATIENT EDUCATION Description: See Patient Education module for education specifics. Outcome: Progressing   Problem: RH BOWEL ELIMINATION Goal: RH STG MANAGE BOWEL WITH ASSISTANCE Description: STG Manage Bowel with min Assistance. Outcome: Progressing Goal: RH STG MANAGE BOWEL W/MEDICATION W/ASSISTANCE Description: STG Manage Bowel with Medication with mod I Assistance. Outcome: Progressing   Problem: RH BLADDER ELIMINATION Goal: RH STG MANAGE BLADDER WITH ASSISTANCE Description: STG Manage Bladder With min Assistance Outcome: Progressing Goal: RH STG MANAGE BLADDER WITH EQUIPMENT WITH ASSISTANCE Description: STG Manage Bladder With Equipment With supervision Assistance Outcome: Progressing   Problem: RH SKIN INTEGRITY Goal: RH STG SKIN FREE OF INFECTION/BREAKDOWN Description: Pt remain free of skin breakdown or infection while on CIR with min assist  Outcome: Progressing Goal: RH STG MAINTAIN SKIN INTEGRITY WITH ASSISTANCE Description: STG Maintain Skin Integrity With min Assistance. Outcome: Progressing   Problem: RH SAFETY Goal: RH STG ADHERE TO SAFETY PRECAUTIONS W/ASSISTANCE/DEVICE Description: STG Adhere to Safety Precautions With cues/reminders Assistance/Device. Outcome: Progressing

## 2019-03-19 ENCOUNTER — Inpatient Hospital Stay (HOSPITAL_COMMUNITY): Payer: Medicare Other

## 2019-03-19 NOTE — Progress Notes (Signed)
Physical Therapy Session Note  Patient Details  Name: Charles Gray MRN: [id number] Date of Birth: 1955/06/26  Today's Date: 03/19/2019 PT Individual Time: 1415-1500 PT Individual Time Calculation (min): 45 min   Short Term Goals: Week 1:  PT Short Term Goal 1 (Week 1): pt will transfer stand<>pivot with LRAD CGA PT Short Term Goal 2 (Week 1): Pt will ambulate 45f with LRAD min A PT Short Term Goal 3 (Week 1): pt will perform car transfer with LRAD CGA  Skilled Therapeutic Interventions/Progress Updates:     Patient in bed upon PT arrival. Patient alert and agreeable to PT session.  Therapeutic Activity: Bed Mobility: Patient performed supine to/from sit with supervision  For safety in a flat bed without use of bedrails. Transfers: Patient performed sit to/from stand x4 and a toilet transfer with supervision using a RW. Required mod I for peri-care and supervision for LB dressing during toileting. Provided verbal cues for reaching back for controlled descent during transfers. Patient donned sweat pants with supervision, threading LEs sitting EOB and pulling up in standing with supervision.   Gait Training:  Patient ambulated 12 feet x2 and 65 feet x2, HR 90-94 after RPE 5/10, using RW with supervision for safety. Ambulated with forward trunk lean, downward head gaze, decreased step height and step length, decreased gate speed, and wide BOS. Provided verbal cues for erect posture, looking ahead, and increased step height.  Patient sitting EOB, RN aware, at end of session with breaks locked and all needs within reach. Educated patient on diagnosis and POC, as patient stated that he continues to have difficulty remembering what happened. Patient receptive and appreciative of education.   Therapy Documentation Precautions:  Precautions Precautions: Fall Precaution Comments: monitor SpO2 with activity Restrictions Weight Bearing Restrictions: No Vital Signs: Therapy Vitals Temp:  97.7 F (36.5 C) Temp Source: Oral Pulse Rate: 65 Resp: 18 BP: (!) 105/40 Patient Position (if appropriate): Lying Oxygen Therapy SpO2: 96 % O2 Device: Room Air    Therapy/Group: Individual Therapy  Charles Gray PT, DPT  03/19/2019, 4:24 PM

## 2019-03-19 NOTE — Progress Notes (Signed)
[place] PHYSICAL MEDICINE & REHABILITATION PROGRESS NOTE  Subjective/Complaints: Dozing off while sitting up.  Bowels are moving a little bit better.  No nausea.  Good appetite  ROS: Denies CP, shortness of breath, nausea, vomiting, diarrhea.  Objective: Vital Signs: Blood pressure 103/60, pulse 74, temperature 98.5 F (36.9 C), temperature source Oral, resp. rate 20, height 6' 10"  (2.083 m), weight (!) 180.1 kg, SpO2 98 %. No results found. Recent Labs    03/17/19 0530  WBC 7.3  HGB 9.1*  HCT 29.9*  PLT 174   No results for input(s): NA, K, CL, CO2, GLUCOSE, BUN, CREATININE, CALCIUM in the last 72 hours.  Physical Exam: BP 103/60   Pulse 74   Temp 98.5 F (36.9 C) (Oral)   Resp 20   Ht 6' 10"  (2.083 m)   Wt (!) 180.1 kg   SpO2 98%   BMI 41.52 kg/m  Constitutional: No distress . Vital signs reviewed.  Morbid obesity. HENT: Normocephalic.  Atraumatic. Eyes: EOMI. No discharge. Cardiovascular: No JVD. Respiratory: Normal effort.  No stridor. GI: Non-distended.  Positive bowel sounds soft nontender palpation Skin: Vascular changes bilateral lower extremities. Psych: Normal mood.  Normal behavior. Musc: Bilateral lower extremities with pitting edema. Neurological:  Alert Makes good eye contact follows commands.   Motor: Right upper extremity: 4+/5 proximal distal Left upper extremity: 5/5 proximal distal Right lower extremity: 4-/5 proximal distal, stable Left lower extremity: 4/5 proximal to distal, stable  Assessment/Plan: 1. Functional deficits secondary to debility which require 3+ hours per day of interdisciplinary therapy in a comprehensive inpatient rehab setting.  Physiatrist is providing close team supervision and 24 hour management of active medical problems listed below.  Physiatrist and rehab team continue to assess barriers to discharge/monitor patient progress toward functional and medical goals  Care Tool:  Bathing    Body parts bathed by  patient: Right arm, Left arm, Chest, Abdomen, Front perineal area, Right upper leg, Left upper leg, Face, Buttocks   Body parts bathed by helper: Buttocks, Right lower leg, Left lower leg     Bathing assist Assist Level: Minimal Assistance - Patient > 75%     Upper Body Dressing/Undressing Upper body dressing   What is the patient wearing?: Pull over shirt    Upper body assist Assist Level: Supervision/Verbal cueing    Lower Body Dressing/Undressing Lower body dressing      What is the patient wearing?: Pants, Underwear/pull up     Lower body assist Assist for lower body dressing: Contact Guard/Touching assist     Toileting Toileting    Toileting assist Assist for toileting: Contact Guard/Touching assist     Transfers Chair/bed transfer  Transfers assist     Chair/bed transfer assist level: Contact Guard/Touching assist     Locomotion Ambulation   Ambulation assist      Assist level: Supervision/Verbal cueing Assistive device: Walker-rolling Max distance: 10   Walk 10 feet activity   Assist     Assist level: Supervision/Verbal cueing Assistive device: Walker-rolling   Walk 50 feet activity   Assist Walk 50 feet with 2 turns activity did not occur: Safety/medical concerns  Assist level: Supervision/Verbal cueing Assistive device: Walker-rolling    Walk 150 feet activity   Assist Walk 150 feet activity did not occur: Safety/medical concerns  Assist level: Supervision/Verbal cueing Assistive device: Walker-rolling    Walk 10 feet on uneven surface  activity   Assist Walk 10 feet on uneven surfaces activity did not occur: Safety/medical concerns  Wheelchair     Assist Will patient use wheelchair at discharge?: Yes Type of Wheelchair: Manual    Wheelchair assist level: Supervision/Verbal cueing Max wheelchair distance: 150    Wheelchair 50 feet with 2 turns activity    Assist        Assist Level:  Supervision/Verbal cueing   Wheelchair 150 feet activity     Assist Wheelchair 150 feet activity did not occur: Safety/medical concerns   Assist Level: Supervision/Verbal cueing      Medical Problem List and Plan: 1.  Debility secondary to GI bleed/hypovolemic/hemorrhagic shock/acute hypoxic respiratory failure. Self extubated 03/06/2019.  Continue CIR PT, OT 2.  Antithrombotics: -DVT/anticoagulation: SCDs.    Vascular study reviewed, negative for DVT             -antiplatelet therapy: N/A 3. Pain Management/chronic pain management: Cymbalta 30 mg daily, Suboxone 2 tablets daily, Neurontin 800 mg every 8 hours  Controlled on 12/4 4. Mood: Provide emotional support             -antipsychotic agents: N/A 5. Neuropsych: This patient is capable of making decisions on his own behalf. 6. Skin/Wound Care: Routine skin checks 7. Fluids/Electrolytes/Nutrition: Routine in and outs 8. Hypertension.  Lasix as directed  Zaroxolyn 5 mg daily, increased to 10 on 12/3  Lisinopril 30 mg daily, decreased to 15 on 12/3   Vitals:   03/19/19 0429 03/19/19 0809  BP: 129/71 103/60  Pulse: 74   Resp: 20   Temp: 98.5 F (36.9 C)   SpO2: 98%   controlled 12/6  Monitor with increased mobility 9. GI bleed.  Follow-up gastroenterology services.  Plan EGD follow-up 2 weeks (~12/15).  Continue Protonix twice daily 10. Morbid obesity.  BMI 42.60.   11. Cirrhosis/hepatic steatosis.    LFTs within normal limits on 12/1  Continue to monitor 12. Thrombocytopenia/anemia.  Felt to be related to cirrhosis.    Hemoglobin 9.1 on 12/4  Platelets 174 on 12/4  Continue to monitor 13. Hyperlipidemia.  Pravachol 14. Hypokalemia  Potassium 3.3 on 12/3, labs ordered for Monday  Supplemented initiated  serum Mg wnl  Continue to monitor 15. Hypoalbuminemia  Supplement initiated on 12/1 16. Lower extremity edema-chronic  Cont lasix  Zaroxolyn increased on 12/3 Filed Weights   03/17/19 0516 03/18/19 0402  03/19/19 0429  Weight: (!) 186.6 kg (!) 177.4 kg (!) 180.1 kg   Fluctuating wts 17.  Drug-induced constipation- small stool today as well   Bowel meds increased senna S 2 po BID  18.  Hyponatremia  Sodium 134 on 12/3  Labs ordered for Monday  LOS: 6 days A FACE TO FACE EVALUATION WAS PERFORMED  Charlett Blake 03/19/2019, 12:01 PM

## 2019-03-20 ENCOUNTER — Inpatient Hospital Stay (HOSPITAL_COMMUNITY): Payer: Medicare Other

## 2019-03-20 ENCOUNTER — Inpatient Hospital Stay (HOSPITAL_COMMUNITY): Payer: Medicare Other | Admitting: Occupational Therapy

## 2019-03-20 LAB — BASIC METABOLIC PANEL
Anion gap: 14 (ref 5–15)
BUN: 19 mg/dL (ref 8–23)
CO2: 35 mmol/L — ABNORMAL HIGH (ref 22–32)
Calcium: 9.3 mg/dL (ref 8.9–10.3)
Chloride: 88 mmol/L — ABNORMAL LOW (ref 98–111)
Creatinine, Ser: 0.95 mg/dL (ref 0.61–1.24)
GFR calc Af Amer: >60 mL/min (ref >60)
GFR calc non Af Amer: >60 mL/min (ref >60)
Glucose, Bld: 100 mg/dL — ABNORMAL HIGH (ref 70–99)
Potassium: 2.8 mmol/L — ABNORMAL LOW (ref 3.5–5.1)
Sodium: 137 mmol/L (ref 135–145)

## 2019-03-20 MED ORDER — PREGABALIN 25 MG PO CAPS
25.0000 mg | ORAL_CAPSULE | Freq: Every day | ORAL | Status: DC
  Administered 2019-03-20 – 2019-03-22 (×3): 25 mg via ORAL
  Filled 2019-03-20 (×3): qty 1

## 2019-03-20 NOTE — Plan of Care (Signed)
  Problem: Consults Goal: RH GENERAL PATIENT EDUCATION Description: See Patient Education module for education specifics. Outcome: Progressing   Problem: RH BOWEL ELIMINATION Goal: RH STG MANAGE BOWEL WITH ASSISTANCE Description: STG Manage Bowel with min Assistance. Outcome: Progressing Goal: RH STG MANAGE BOWEL W/MEDICATION W/ASSISTANCE Description: STG Manage Bowel with Medication with mod I Assistance. Outcome: Progressing   Problem: RH BLADDER ELIMINATION Goal: RH STG MANAGE BLADDER WITH ASSISTANCE Description: STG Manage Bladder With min Assistance Outcome: Progressing Goal: RH STG MANAGE BLADDER WITH EQUIPMENT WITH ASSISTANCE Description: STG Manage Bladder With Equipment With supervision Assistance Outcome: Progressing   Problem: RH SKIN INTEGRITY Goal: RH STG SKIN FREE OF INFECTION/BREAKDOWN Description: Pt remain free of skin breakdown or infection while on CIR with min assist  Outcome: Progressing Goal: RH STG MAINTAIN SKIN INTEGRITY WITH ASSISTANCE Description: STG Maintain Skin Integrity With min Assistance. Outcome: Progressing   Problem: RH SAFETY Goal: RH STG ADHERE TO SAFETY PRECAUTIONS W/ASSISTANCE/DEVICE Description: STG Adhere to Safety Precautions With cues/reminders Assistance/Device. Outcome: Progressing

## 2019-03-20 NOTE — Progress Notes (Signed)
[place] PHYSICAL MEDICINE & REHABILITATION PROGRESS NOTE  Subjective/Complaints: He complains of a right sided hip pain that radiates into his thigh and feels like burning. His Gabapentin does help with the pain and he does not feel that it makes him too sleepy. He is sleeping well at night when he receives the Ambien. Denies depression.  ROS: Denies CP, shortness of breath, nausea, vomiting, diarrhea.  Objective: Vital Signs: Blood pressure 112/64, pulse 72, temperature 98.4 F (36.9 C), temperature source Oral, resp. rate 18, height 6' 10"  (2.083 m), weight (!) 179.6 kg, SpO2 96 %. No results found. No results for input(s): WBC, HGB, HCT, PLT in the last 72 hours. No results for input(s): NA, K, CL, CO2, GLUCOSE, BUN, CREATININE, CALCIUM in the last 72 hours.  Physical Exam: BP 112/64 (BP Location: Left Arm)   Pulse 72   Temp 98.4 F (36.9 C) (Oral)   Resp 18   Ht 6' 10"  (2.083 m)   Wt (!) 179.6 kg   SpO2 96%   BMI 41.40 kg/m  Constitutional: No distress . Vital signs reviewed.  Morbid obesity. HENT: Normocephalic.  Atraumatic. Eyes: EOMI. No discharge. Cardiovascular: No JVD. Respiratory: Normal effort.  No stridor. GI: Non-distended.  Positive bowel sounds soft nontender palpation Skin: Vascular changes bilateral lower extremities. Psych: Normal mood.  Normal behavior. Musc: Bilateral lower extremities with pitting edema. Neurological:  Alert Makes good eye contact follows commands.   Motor: Right upper extremity: 5/5 proximal distal Left upper extremity: 5/5 proximal distal Right lower extremity: 4-/5 proximal distal, stable Left lower extremity: 4/5 proximal to distal, stable  Assessment/Plan: 1. Functional deficits secondary to debility which require 3+ hours per day of interdisciplinary therapy in a comprehensive inpatient rehab setting.  Physiatrist is providing close team supervision and 24 hour management of active medical problems listed  below.  Physiatrist and rehab team continue to assess barriers to discharge/monitor patient progress toward functional and medical goals  Care Tool:  Bathing    Body parts bathed by patient: Right arm, Left arm, Chest, Abdomen, Front perineal area, Right upper leg, Left upper leg, Face, Buttocks, Right lower leg, Left lower leg   Body parts bathed by helper: Buttocks, Right lower leg, Left lower leg     Bathing assist Assist Level: Supervision/Verbal cueing     Upper Body Dressing/Undressing Upper body dressing   What is the patient wearing?: Pull over shirt    Upper body assist Assist Level: Set up assist    Lower Body Dressing/Undressing Lower body dressing      What is the patient wearing?: Pants, Underwear/pull up     Lower body assist Assist for lower body dressing: Supervision/Verbal cueing     Toileting Toileting    Toileting assist Assist for toileting: Supervision/Verbal cueing     Transfers Chair/bed transfer  Transfers assist     Chair/bed transfer assist level: Supervision/Verbal cueing     Locomotion Ambulation   Ambulation assist      Assist level: Supervision/Verbal cueing Assistive device: Walker-rolling Max distance: 10   Walk 10 feet activity   Assist     Assist level: Supervision/Verbal cueing Assistive device: Walker-rolling   Walk 50 feet activity   Assist Walk 50 feet with 2 turns activity did not occur: Safety/medical concerns  Assist level: Supervision/Verbal cueing Assistive device: Walker-rolling    Walk 150 feet activity   Assist Walk 150 feet activity did not occur: Safety/medical concerns  Assist level: Supervision/Verbal cueing Assistive device: Walker-rolling  Walk 10 feet on uneven surface  activity   Assist Walk 10 feet on uneven surfaces activity did not occur: Safety/medical concerns         Wheelchair     Assist Will patient use wheelchair at discharge?: Yes Type of Wheelchair:  Manual    Wheelchair assist level: Supervision/Verbal cueing Max wheelchair distance: 150    Wheelchair 50 feet with 2 turns activity    Assist        Assist Level: Supervision/Verbal cueing   Wheelchair 150 feet activity     Assist Wheelchair 150 feet activity did not occur: Safety/medical concerns   Assist Level: Supervision/Verbal cueing      Medical Problem List and Plan: 1.  Debility secondary to GI bleed/hypovolemic/hemorrhagic shock/acute hypoxic respiratory failure. Self extubated 03/06/2019.  Continue CIR PT, OT 2.  Antithrombotics: -DVT/anticoagulation: SCDs.    Vascular study reviewed, negative for DVT             -antiplatelet therapy: N/A 3. Pain Management/chronic pain management: Cymbalta 30 mg daily, Suboxone 2 tablets daily, Neurontin 800 mg every 8 hours  Controlled on 12/4  Continuing to have burning pain radiating into his thigh. Will start Lyrica 28m daily since he is having good benefit from Gabapentin but is already on high dose.  4. Mood: Provide emotional support             -antipsychotic agents: N/A 5. Neuropsych: This patient is capable of making decisions on his own behalf. 6. Skin/Wound Care: Routine skin checks 7. Fluids/Electrolytes/Nutrition: Routine in and outs 8. Hypertension.  Lasix as directed  Zaroxolyn 5 mg daily, increased to 10 on 12/3  Lisinopril 30 mg daily, decreased to 15 on 12/3   Vitals:   03/19/19 1936 03/20/19 0433  BP: 119/62 112/64  Pulse: 75 72  Resp: 18 18  Temp: 98.9 F (37.2 C) 98.4 F (36.9 C)  SpO2: 97% 96%  controlled 12/6  Monitor with increased mobility 9. GI bleed.  Follow-up gastroenterology services.  Plan EGD follow-up 2 weeks (~12/15).  Continue Protonix twice daily 10. Morbid obesity.  BMI 42.60.   11. Cirrhosis/hepatic steatosis.    LFTs within normal limits on 12/1  Continue to monitor 12. Thrombocytopenia/anemia.  Felt to be related to cirrhosis.    Hemoglobin 9.1 on  12/4  Platelets 174 on 12/4  Continue to monitor 13. Hyperlipidemia.  Pravachol 14. Hypokalemia  Potassium 3.3 on 12/3, BMP ordered for 12/7  Supplemented initiated  serum Mg wnl  Continue to monitor 15. Hypoalbuminemia  Supplement initiated on 12/1 16. Lower extremity edema-chronic  Cont lasix  Zaroxolyn increased on 12/3 Filed Weights   03/18/19 0402 03/19/19 0429 03/20/19 0429  Weight: (!) 177.4 kg (!) 180.1 kg (!) 179.6 kg   Fluctuating wts 17.  Drug-induced constipation- small stool today as well   Bowel meds increased senna S 2 po BID  18.  Hyponatremia  Sodium 134 on 12/3  BMP ordered for 12/7  LOS: 7 days A FACE TO FACE EVALUATION WAS PERFORMED  KClide DeutscherRaulkar 03/20/2019, 9:16 AM

## 2019-03-20 NOTE — Progress Notes (Signed)
Occupational Therapy Session Note  Patient Details  Name: Charles Gray MRN: [id number] Date of Birth: 03-10-1956  Today's Date: 03/20/2019 OT Individual Time: 9774-1423 and 0950-1050 OT Individual Time Calculation (min): 60 min and 60 min   Short Term Goals: Week 1:  OT Short Term Goal 1 (Week 1): Pt will complete LB dressing with min assist OT Short Term Goal 2 (Week 1): Pt will complete toilet tranfers with Supervision with LRAD OT Short Term Goal 3 (Week 1): Pt will complete 2 grooming tasks in standing with supervision to demonstrate increased endurance/activity tolerance OT Short Term Goal 4 (Week 1): Pt will complete bathing with min assist  Skilled Therapeutic Interventions/Progress Updates:   1) Treatment session with focus on ADL retraining and problem solving DME for home shower.  Pt received on toilet having BM.  Pt reports feeling constipated, having hard stool.  Completed toilet transfers with RW Mod I.  Ambulated to room shower with RW with supervision, completed bathing at sit > stand level with distant supervision.  Dressing completed from EOB with setup for items.  Pt applied lotion and donned hospital socks in figure 4 position.  Discussed DME for home shower to accommodate his weight, performing an internet search and providing options. Pt plans to further explore his options and his daughter in law can order it for him.  Pt reports discomfort in abdomen, reporting from his bowels, when he bends forward during self-care tasks.  Overall distant supervision for all self-care tasks this session.  2) Treatment session with focus on functional transfers in home environment and energy conservation during meal prep tasks.  Pt received on toilet reporting sensation to have BM, however no success.  Completed tub/shower transfers in ADL tub room with pt stepping over tub ledge with supervision.  Again discussed options for tub bench vs shower chair and coverage to accommodate his  weight.  Pt able to utilize bariatric tub bench during this session with supervision.  Can utilize via sit and lift legs over ledge or stepping over tub ledge.  Engaged in simulated meal prep with education on energy conservation strategies with use of crockpot and doubling or tripling recipes to allow for leftovers to ensure that pt does not have to cook every meal.  Discussed use of stool to allow pt to "perch" as needed during meal prep for energy conservation.  Pt reports "worried" about d/c home but feels that meal prep activity answered many questions/concerns.  Therapy Documentation Precautions:  Precautions Precautions: Fall Precaution Comments: monitor SpO2 with activity Restrictions Weight Bearing Restrictions: No General:   Vital Signs: Therapy Vitals Temp: 98.4 F (36.9 C) Temp Source: Oral Pulse Rate: 72 Resp: 18 BP: 112/64 Patient Position (if appropriate): Sitting Oxygen Therapy SpO2: 96 % O2 Device: Room Air Pain:  Pt with c/o pain in back and legs.  Largely attributes pain to bed.   Therapy/Group: Individual Therapy  Simonne Come 03/20/2019, 8:25 AM

## 2019-03-20 NOTE — Progress Notes (Signed)
Physical Therapy Session Note  Patient Details  Name: Charles Gray MRN: [id number] Date of Birth: Aug 06, 1955  Today's Date: 03/20/2019 PT Individual Time: 1130-1200 and 1400-1459  PT Individual Time Calculation (min): 30 min and 59 min  Short Term Goals: Week 1:  PT Short Term Goal 1 (Week 1): pt will transfer stand<>pivot with LRAD CGA PT Short Term Goal 2 (Week 1): Pt will ambulate 74f with LRAD min A PT Short Term Goal 3 (Week 1): pt will perform car transfer with LRAD CGA  Skilled Therapeutic Interventions/Progress Updates:   Treatment Session 1: 1130-1200 30 min Received pt sitting EOB, pt agreeable to therapy, and reported pain in low back but did not state pain number. Session focused on functional mobility/transfers, ambulation with RW, stair navigation, LE strength, and improved endurance. Pt ambulated 1017fwith RW CGA (to hold pants up, otherwise supervision) to therapy gym but required a seated rest break. O2 sat 96% and HR 97bpm. Pt ambulated additional 506fith RW CGA to therapy gym. Pt navigated 4 steps with 2 rails with supervision ascending with a step to and descending with a step through pattern. Discussed discharge planning with pt, pt expressed he is concerned about getting to and from doctors appointments and states he doesn't think he is able to ride the bus; therapist agreed. Therapist encouraged pt to ask son for ride but pt stated it would be better for his son to hear that from the doctor rather than him asking his son for a ride. Pt ambulated 27f40fth RW CGA and requested seated rest break. Pt ambulated additional 100ft46fh RW CGA back to room. Concluded session with pt sitting EOB for lunch, needs within reach, bed alarm not on but RN aware.   Treatment Session 2: [id number] 53Received pt sitting on toilet, therapist assisted with care. Pt agreeable to therapy and denied pain throughout session.  Pt navigated 16 steps with bilateral rails supervision  ascending with a step to and descending with a step through pattern. O2 sat 96% and HR 105bpm after activity. Pt required multiple seated rest and water breaks during session. Pt performed sit<>stands without UE support 1x10 and 1x7 with supervision. Standing on [employer] without UE support pt threw horseshoes x3 trials with CGA. Pt ambulated 10ft 40f uneven surfaces (mat) with RW CGA. Pt performed WC mobility 75ft u43f bilateral UE's and supervision. Concluded session with pt sitting EOB ordering dinner, needs within reach, bed alarm not on. RN aware.   Therapy Documentation Precautions:  Precautions Precautions: Fall Precaution Comments: monitor SpO2 with activity Restrictions Weight Bearing Restrictions: No  Therapy/Group: Individual Therapy Kima Malenfant M Alfonse AlpersT   03/20/2019, 7:40 AM

## 2019-03-21 ENCOUNTER — Inpatient Hospital Stay (HOSPITAL_COMMUNITY): Payer: Medicare Other

## 2019-03-21 ENCOUNTER — Inpatient Hospital Stay (HOSPITAL_COMMUNITY): Payer: Medicare Other | Admitting: Occupational Therapy

## 2019-03-21 MED ORDER — POTASSIUM CHLORIDE 20 MEQ PO PACK
40.0000 meq | PACK | Freq: Two times a day (BID) | ORAL | Status: DC
  Administered 2019-03-21 – 2019-03-22 (×3): 40 meq via ORAL
  Filled 2019-03-21 (×3): qty 2

## 2019-03-21 NOTE — Progress Notes (Signed)
[facility] PHYSICAL MEDICINE [facility] REHABILITATION PROGRESS NOTE  Subjective/Complaints: He is sleeping well at night when he receives the Ambien.  Feels more fatigued this morning--has not yet received Lyrica. States he only slept 2 hours last night, but does take 15-20 minute naps during the day.   ROS: Denies CP, shortness of breath, nausea, vomiting, diarrhea.  Objective: Vital Signs: Blood pressure 93/83, pulse 67, temperature 98.1 F (36.7 C), temperature source Oral, resp. rate 18, height 6' 10"  (2.083 m), weight (!) 175.5 kg, SpO2 99 %. No results found. No results for input(s): WBC, HGB, HCT, PLT in the last 72 hours. Recent Labs    03/20/19 1102  NA 137  K 2.8*  CL 88*  CO2 35*  GLUCOSE 100*  BUN 19  CREATININE 0.95  CALCIUM 9.3    Physical Exam: BP 93/83 (BP Location: Left Arm)   Pulse 67   Temp 98.1 F (36.7 C) (Oral)   Resp 18   Ht 6' 10"  (2.083 m)   Wt (!) 175.5 kg   SpO2 99%   BMI 40.46 kg/m  Constitutional: No distress . Vital signs reviewed.  Morbid obesity. HENT: Normocephalic.  Atraumatic. Eyes: EOMI. No discharge. Cardiovascular: No JVD. Respiratory: Normal effort.  No stridor. GI: Non-distended.  Positive bowel sounds soft nontender palpation Skin: Vascular changes bilateral lower extremities. Psych: Normal mood.  Normal behavior. Musc: Bilateral lower extremities with pitting edema. Neurological:  Alert Makes good eye contact follows commands.   Motor: Right upper extremity: 5/5 proximal distal Left upper extremity: 5/5 proximal distal Right lower extremity: 4-/5 proximal distal, stable Left lower extremity: 4/5 proximal to distal, stable  Assessment/Plan: 1. Functional deficits secondary to debility which require 3+ hours per day of interdisciplinary therapy in a comprehensive inpatient rehab setting.  Physiatrist is providing close team supervision and 24 hour management of active medical problems listed below.  Physiatrist and rehab  team continue to assess barriers to discharge/monitor patient progress toward functional and medical goals  Care Tool:  Bathing    Body parts bathed by patient: Right arm, Left arm, Chest, Abdomen, Front perineal area, Right upper leg, Left upper leg, Face, Buttocks, Right lower leg, Left lower leg   Body parts bathed by helper: Buttocks, Right lower leg, Left lower leg     Bathing assist Assist Level: Supervision/Verbal cueing     Upper Body Dressing/Undressing Upper body dressing   What is the patient wearing?: Pull over shirt    Upper body assist Assist Level: Set up assist    Lower Body Dressing/Undressing Lower body dressing      What is the patient wearing?: Pants, Underwear/pull up     Lower body assist Assist for lower body dressing: Supervision/Verbal cueing     Toileting Toileting    Toileting assist Assist for toileting: Supervision/Verbal cueing     Transfers Chair/bed transfer  Transfers assist     Chair/bed transfer assist level: Supervision/Verbal cueing     Locomotion Ambulation   Ambulation assist      Assist level: Contact Guard/Touching assist Assistive device: Walker-rolling Max distance: 134f   Walk 10 feet activity   Assist     Assist level: Contact Guard/Touching assist Assistive device: Walker-rolling   Walk 50 feet activity   Assist Walk 50 feet with 2 turns activity did not occur: Safety/medical concerns  Assist level: Contact Guard/Touching assist Assistive device: Walker-rolling    Walk 150 feet activity   Assist Walk 150 feet activity did not occur: Safety/medical concerns  Assist level: Supervision/Verbal cueing Assistive device: Walker-rolling    Walk 10 feet on uneven surface  activity   Assist Walk 10 feet on uneven surfaces activity did not occur: Safety/medical concerns         Wheelchair     Assist Will patient use wheelchair at discharge?: Yes Type of Wheelchair: Manual     Wheelchair assist level: Supervision/Verbal cueing Max wheelchair distance: 86f    Wheelchair 50 feet with 2 turns activity    Assist        Assist Level: Supervision/Verbal cueing   Wheelchair 150 feet activity     Assist Wheelchair 150 feet activity did not occur: Safety/medical concerns   Assist Level: Supervision/Verbal cueing      Medical Problem List and Plan: 1.  Debility secondary to GI bleed/hypovolemic/hemorrhagic shock/acute hypoxic respiratory failure. Self extubated 03/06/2019.  Continue CIR PT, OT 2.  Antithrombotics: -DVT/anticoagulation: SCDs.    Vascular study reviewed, negative for DVT             -antiplatelet therapy: N/A 3. Pain Management/chronic pain management: Cymbalta 30 mg daily, Suboxone 2 tablets daily, Neurontin 800 mg every 8 hours  Controlled on 12/4  Continuing to have burning pain radiating into his thigh. Will start Lyrica 269mdaily since he is having good benefit from Gabapentin but is already on high dose.  4. Mood: Provide emotional support             -antipsychotic agents: N/A 5. Neuropsych: This patient is capable of making decisions on his own behalf. 6. Skin/Wound Care: Routine skin checks 7. Fluids/Electrolytes/Nutrition: Routine in and outs 8. Hypertension.  Lasix as directed  Zaroxolyn 5 mg daily, increased to 10 on 12/3  Lisinopril 30 mg daily, decreased to 15 on 12/3   Vitals:   03/20/19 2021 03/21/19 0505  BP: (!) 96/47 93/83  Pulse: 77 67  Resp: 20 18  Temp: 98.7 F (37.1 C) 98.1 F (36.7 C)  SpO2: 96% 99%  controlled 12/6  Monitor with increased mobility 9. GI bleed.  Follow-up gastroenterology services.  Plan EGD follow-up 2 weeks (~12/15).  Continue Protonix twice daily 10. Morbid obesity.  BMI 42.60.   11. Cirrhosis/hepatic steatosis.    LFTs within normal limits on 12/1  Continue to monitor 12. Thrombocytopenia/anemia.  Felt to be related to cirrhosis.    Hemoglobin 9.1 on 12/4  Platelets 174  on 12/4  Continue to monitor 13. Hyperlipidemia.  Pravachol 14. Hypokalemia  Potassium 3.3 on 12/3.  Supplemented initiated  K+ 2.8 on 12/7, will supplement with [id number] and repeat BMP tomorrow.   serum Mg wnl  Continue to monitor 15. Hypoalbuminemia  Supplement initiated on 12/1 16. Lower extremity edema-chronic  Cont lasix  Zaroxolyn increased on 12/3 Filed Weights   03/19/19 0429 03/20/19 0429 03/21/19 0508  Weight: (!) 180.1 kg (!) 179.6 kg (!) 175.5 kg   Fluctuating wts 17.  Drug-induced constipation- small stool today as well   Bowel meds increased senna S 2 po BID  18.  Hyponatremia  Sodium 134 on 12/3, 137 on 12/7  LOS: 8 days A FACE TO FACE EVALUATION WAS PERFORMED  Rayfield Beem P Detric Scalisi 03/21/2019, 10:26 AM

## 2019-03-21 NOTE — Discharge Summary (Signed)
Physician Discharge Summary  Patient ID: Shrihan Putt MRN: [id number] DOB/AGE: 07/13/1955 63 y.o.  Admit date: 03/13/2019 Discharge date: 03/23/2019  Discharge Diagnoses:  Principal Problem:   Debility Active Problems:   GIB (gastrointestinal bleeding)   Hypoalbuminemia due to protein-calorie malnutrition (HCC)   Hypokalemia   Thrombocytopenia (HCC)   Anemia of chronic disease   Cirrhosis of liver without ascites (HCC)   Morbid obesity (HCC)   Chronic pain syndrome   Drug induced constipation   Peripheral edema   Hypotension due to drugs   Hyponatremia   Benign essential HTN DVT prophylaxis hypokalemia  Discharged Condition: Stable  Significant Diagnostic Studies: CT HEAD WO CONTRAST  Result Date: 03/07/2019 CLINICAL DATA:  Onset right sided headache today. EXAM: CT HEAD WITHOUT CONTRAST TECHNIQUE: Contiguous axial images were obtained from the base of the skull through the vertex without intravenous contrast. COMPARISON:  None. FINDINGS: Brain: No evidence of acute infarction, hemorrhage, hydrocephalus, extra-axial collection or mass lesion/mass effect. Vascular: No hyperdense vessel or unexpected calcification. Skull: Normal. Negative for fracture or focal lesion. Sinuses/Orbits: Mild mucosal thickening right maxillary sinus noted. Other: None. IMPRESSION: Normal head CT. Electronically Signed   By: Inge Rise M.D.   On: 03/07/2019 11:44   DG CHEST PORT 1 VIEW  Result Date: 03/05/2019 CLINICAL DATA:  Respiratory failure. EXAM: PORTABLE CHEST 1 VIEW COMPARISON:  03/04/2019 FINDINGS: 1135 hours. Endotracheal tube tip is 4.9 cm above the base of the carina. Right IJ central line tip overlies the mid SVC level. The cardio pericardial silhouette is enlarged. There is pulmonary vascular congestion without overt pulmonary edema. Minimal bibasilar atelectasis or infiltrate is similar to prior. IMPRESSION: 1. Interval intubation with endotracheal tube tip about 4.9 cm above  the base of the carina. 2. Similar bibasilar atelectasis or infiltrate. 3. Cardiomegaly with vascular congestion. Electronically Signed   By: Misty Stanley M.D.   On: 03/05/2019 13:14   DG CHEST PORT 1 VIEW  Result Date: 03/05/2019 CLINICAL DATA:  Dyspnea. EXAM: PORTABLE CHEST 1 VIEW COMPARISON:  Radiograph earlier this day at 0232 hour. Chest CT 03/02/2019 FINDINGS: Right internal jugular central line tip in the mid SVC. Unchanged heart size and mediastinal contours. Slight increase in bibasilar atelectasis since prior exam. Normal heart size and mediastinal contours. No pulmonary edema, large pleural effusion or pneumothorax. IMPRESSION: 1. Slight increase in bibasilar atelectasis. 2. Right internal jugular central line tip in the mid SVC. Electronically Signed   By: Keith Rake M.D.   On: 03/05/2019 01:50   ECHOCARDIOGRAM COMPLETE  Result Date: 03/06/2019   ECHOCARDIOGRAM REPORT   Patient Name:   CLEMON DEVAUL Date of Exam: 03/05/2019 Medical Rec #:  [id number]       Height:       82.0 in Accession #:    [id number]      Weight:       416.2 lb Date of Birth:  Oct 10, 1955       BSA:          3.20 m Patient Age:    63 years        BP:           112/73 mmHg Patient Gender: M               HR:           98 bpm. Exam Location:  Inpatient Procedure: 2D Echo and Intracardiac Opacification Agent Indications:    dyspnea 786.09  History:        Patient  has no prior history of Echocardiogram examinations.                 Shock, Signs/Symptoms:Dyspnea; Risk Factors:Hypertension and                 Dyslipidemia.  Sonographer:    Johny Chess Referring Phys: [id number] LAURA R [name]  Sonographer Comments: Echo performed with patient supine and on artificial respirator and patient is morbidly obese. IMPRESSIONS  1. Technically difficult study in all windows. Definity contrast shows hyperdynamic EF, no gross focal wall motion abnormalities but limited sensitivity for detection of minor abnormalities.  2. Left  ventricular ejection fraction, by visual estimation, is 65 to 70%. The left ventricle has hyperdynamic function. There is borderline left ventricular hypertrophy.  3. Definity contrast agent was given IV to delineate the left ventricular endocardial borders.  4. The left ventricle has no regional wall motion abnormalities.  5. Global right ventricle has normal systolic function.The right ventricular size is normal. No increase in right ventricular wall thickness.  6. Left atrial size was normal.  7. Right atrial size was normal.  8. Presence of pericardial fat pad.  9. Small pericardial effusion. 10. The mitral valve is normal in structure. No evidence of mitral valve regurgitation. 11. The tricuspid valve is normal in structure. Tricuspid valve regurgitation is not demonstrated. 12. The aortic valve is tricuspid. Aortic valve regurgitation is not visualized. No evidence of aortic valve sclerosis or stenosis. 13. The pulmonic valve was not well visualized. Pulmonic valve regurgitation is not visualized. 14. TR signal is inadequate for assessing pulmonary artery systolic pressure. 15. The inferior vena cava is dilated in size with <50% respiratory variability, suggesting right atrial pressure of 15 mmHg. FINDINGS  Left Ventricle: Left ventricular ejection fraction, by visual estimation, is 65 to 70%. The left ventricle has hyperdynamic function. Definity contrast agent was given IV to delineate the left ventricular endocardial borders. The left ventricle has no regional wall motion abnormalities. There is borderline left ventricular hypertrophy. Left ventricular diastolic parameters were normal. Right Ventricle: The right ventricular size is normal. No increase in right ventricular wall thickness. Global RV systolic function is has normal systolic function. Left Atrium: Left atrial size was normal in size. Right Atrium: Right atrial size was normal in size Pericardium: A small pericardial effusion is present.  Presence of pericardial fat pad. Mitral Valve: The mitral valve is normal in structure. No evidence of mitral valve regurgitation. Tricuspid Valve: The tricuspid valve is normal in structure. Tricuspid valve regurgitation is not demonstrated. Aortic Valve: The aortic valve is tricuspid. Aortic valve regurgitation is not visualized. The aortic valve is structurally normal, with no evidence of sclerosis or stenosis. Pulmonic Valve: The pulmonic valve was not well visualized. Pulmonic valve regurgitation is not visualized. Aorta: The aortic root, ascending aorta and aortic arch are all structurally normal, with no evidence of dilitation or obstruction. Pulmonary Artery: The pulmonary artery is not well seen. Venous: The inferior vena cava is dilated in size with less than 50% respiratory variability, suggesting right atrial pressure of 15 mmHg. IAS/Shunts: No atrial level shunt detected by color flow Doppler.  LEFT VENTRICLE PLAX 2D LVIDd:         5.50 cm  Diastology LVIDs:         3.17 cm  LV e' lateral:   12.30 cm/s LV PW:         1.20 cm  LV E/e' lateral: 5.9 LV IVS:  1.20 cm  LV e' medial:    11.30 cm/s LVOT diam:     2.00 cm  LV E/e' medial:  6.4 LV SV:         107 ml LV SV Index:   31.83 LVOT Area:     3.14 cm  LEFT ATRIUM           Index LA diam:      3.00 cm 0.94 cm/m LA Vol (A4C): 39.8 ml 12.45 ml/m   AORTA Ao Root diam: 3.60 cm MITRAL VALVE MV Area (PHT): 3.93 cm             SHUNTS MV PHT:        56.00 msec           Systemic Diam: 2.00 cm MV E velocity: 72.30 cm/s 103 cm/s MV A velocity: 72.30 cm/s 70.3 cm/s MV E/A ratio:  1.00       1.5  Buford Dresser MD Electronically signed by Buford Dresser MD Signature Date/Time: 03/06/2019/11:37:44 AM    Final    VAS [place] LOWER EXTREMITY VENOUS (DVT)  Result Date: 03/14/2019  Lower Venous Study Indications: Swelling.  Limitations: Poor ultrasound/tissue interface and body habitus. Comparison Study: no prior Performing Technologist: Abram Sander RVS  Examination Guidelines: A complete evaluation includes B-mode imaging, spectral Doppler, color Doppler, and power Doppler as needed of all accessible portions of each vessel. Bilateral testing is considered an integral part of a complete examination. Limited examinations for reoccurring indications may be performed as noted.  +---------+---------------+---------+-----------+----------+--------------+ RIGHT    CompressibilityPhasicitySpontaneityPropertiesThrombus Aging +---------+---------------+---------+-----------+----------+--------------+ CFV      Full           Yes      Yes                                 +---------+---------------+---------+-----------+----------+--------------+ SFJ      Full                                                        +---------+---------------+---------+-----------+----------+--------------+ FV Prox  Full                                                        +---------+---------------+---------+-----------+----------+--------------+ FV Mid   Full                                                        +---------+---------------+---------+-----------+----------+--------------+ FV DistalFull                                                        +---------+---------------+---------+-----------+----------+--------------+ PFV      Full                                                        +---------+---------------+---------+-----------+----------+--------------+  POP      Full           Yes      Yes                                 +---------+---------------+---------+-----------+----------+--------------+ PTV                                                   Not visualized +---------+---------------+---------+-----------+----------+--------------+ PERO                                                  Not visualized +---------+---------------+---------+-----------+----------+--------------+    +---------+---------------+---------+-----------+----------+--------------+ LEFT     CompressibilityPhasicitySpontaneityPropertiesThrombus Aging +---------+---------------+---------+-----------+----------+--------------+ CFV      Full           Yes      Yes                                 +---------+---------------+---------+-----------+----------+--------------+ SFJ      Full                                                        +---------+---------------+---------+-----------+----------+--------------+ FV Prox  Full                                                        +---------+---------------+---------+-----------+----------+--------------+ FV Mid   Full                                                        +---------+---------------+---------+-----------+----------+--------------+ FV DistalFull                                                        +---------+---------------+---------+-----------+----------+--------------+ PFV      Full                                                        +---------+---------------+---------+-----------+----------+--------------+ POP      Full           Yes      Yes                                 +---------+---------------+---------+-----------+----------+--------------+ PTV  Not visualized +---------+---------------+---------+-----------+----------+--------------+ PERO                                                  Not visualized +---------+---------------+---------+-----------+----------+--------------+     Summary: Right: There is no evidence of deep vein thrombosis in the lower extremity. However, portions of this examination were limited- see technologist comments above. No cystic structure found in the popliteal fossa. Left: There is no evidence of deep vein thrombosis in the lower extremity. However, portions of this examination were limited-  see technologist comments above. No cystic structure found in the popliteal fossa.  *See table(s) above for measurements and observations. Electronically signed by Deitra Mayo MD on 03/14/2019 at 12:20:11 PM.    Final     Labs:  Basic Metabolic Panel: Recent Labs  Lab 03/16/19 1106 03/18/19 1141 03/20/19 1102 03/22/19 0534  NA 134*  --  137 136  K 3.3*  --  2.8* 3.0*  CL 92*  --  88* 89*  CO2 31  --  35* 31  GLUCOSE 72  --  100* 138*  BUN 20  --  19 27*  CREATININE 0.95  --  0.95 1.01  CALCIUM 8.6*  --  9.3 8.8*  MG  --  1.9  --   --     CBC: Recent Labs  Lab 03/17/19 0530  WBC 7.3  NEUTROABS 5.5  HGB 9.1*  HCT 29.9*  MCV 91.7  PLT 174    CBG: No results for input(s): GLUCAP in the last 168 hours. Family history.  Mother with hyperlipidemia and hypertension.  Maternal grandmother with CAD.  Maternal grandfather with CAD.  Denies diabetes mellitus or colon cancer  Brief HPI:   Rosco Harriott is a 63 y.o. right-handed male with history of hyperlipidemia, asthma, cirrhosis, hypertension, herniated disc with chronic pain maintained on Suboxone followed by pain management, morbid obesity BMI 42.60.  Per chart review lives alone independent prior to admission his son does the grocery shopping.  Presented 03/02/2019 to [facility] with increasing shortness of breath and leg edema.  During his hospital course acute drop in blood pressure as well as hemoglobin with melanotic stool.  He had increasing leukocytosis and lactic acidosis but was afebrile.  Placed on broad-spectrum antibiotics.  CT of abdomen pelvis abdominal CTA without obvious source of bleeding.  Hemoglobin did drop from 14-7.5.  He was given 3 units packed red blood cells placed on Protonix and transferred to [facility] 03/04/2019 for further evaluation.  Patient did require intubation for airway protection.  Upper GI endoscopy 03/05/2019 per gastroenterology Dr. Loletha Carrow with no varices seen.  A large  amount of fresh and clotted blood was found in the gastric fundus and in the proximal gastric body.  Very mild portal hypertensive gastropathy.  There was a chain of lesions on a fold in the distal gastric fundus with purple discoloration.  Patient self extubated 03/06/2019.  Bouts initially of confusion agitation cranial CT scan 03/07/2019 showed no acute findings.  Hemoglobin stabilized 8.4.  Tolerating a mechanical soft diet.  Noted thrombocytopenia improved 111000-136000 felt to be reactive versus cirrhosis.  Patient was admitted for a comprehensive rehab program   Hospital Course: Rhian Funari was admitted to rehab 03/13/2019 for inpatient therapies to consist of PT, ST and OT at least three hours five days a week. Past admission  physiatrist, therapy team and rehab RN have worked together to provide customized collaborative inpatient rehab.  Pertaining to patient's debility related to GI bleed hypovolemic hemorrhagic shock acute respiratory failure.  Self extubated 03/06/2019.  Oxygen saturations remained greater than 90% on room air.  SCDs for DVT prophylaxis.  Venous Doppler studies negative.  Chronic pain management and maintained on Suboxone as prior to admission as well as the addition of Neurontin 800 mg every 8 hours as well as Lyrica 25 mg daily.  Blood pressure remained somewhat soft and monitored patient on Zaroxolyn  increased to 10 mg on 03/16/2019 as well as lisinopril 30 mg it was decreased to 15 mg 03/16/2019 he did remain on Lasix therapy monitoring for any signs of fluid overload.Hypokalemia with supplement added.  Patient would follow-up with PCP.  Noted GI bleed followed by gastroenterology services recommendations for EGD follow-up as outpatient and continue Protonix as directed.  Morbid obesity BMI 42.60 dietary follow-up.  Noted cirrhosis hepatic steatosis with LFTs within normal limits 03/14/2019.  Thrombocytopenia felt to be related to cirrhosis stable 174,000 and latest hemoglobin  9.1.  Patient remain on Pravachol for hyperlipidemia.  Drug-induced constipation bowel program adjusted.   Blood pressures were monitored on TID basis and controlled  He/ is continent of bowel and bladder.  He/ has made gains during rehab stay and is therapies  He/ will continue to receive follow up therapies   after discharge  Rehab course: During patient's stay in rehab weekly team conferences were held to monitor patient's progress, set goals and discuss barriers to discharge. At admission, patient required minimal assist ambulate 12 feet rolling walker, minimal assist stand pivot transfers, minimal assist sit to stand.  Set up for upper body bathing, moderate assist lower body bathing, set up upper body dressing, max assist lower body dressing  Physical exam.  Blood pressure 114/72 pulse 75 temperature 98.1 respirations 18 oxygen saturations 95% room air Neurological patient alert no acute distress makes good eye contact follow commands. General.  No acute distress mood and affect appropriate Cardiac regular rate rhythm without rubs murmurs or extra sounds Lungs.  Clear to auscultation breathing unlabored no rales or wheezes Abdomen.  Positive bowel sounds soft nontender palpation nondistended Extremities.  No clubbing cyanosis or edema Skin.  No evidence of breakdown, stasis dermatitis bilateral pretibial area Neurological.  Cranial nerves II through XII intact motor strength 5 out of 5 bilateral deltoid bicep tricep grip 3- out of 5 hip flexors knee extensors ankle dorsi flexion plantar flexion.  Sensation intact to light touch  He/  has had improvement in activity tolerance, balance, postural control as well as ability to compensate for deficits. He/ has had improvement in functional use RUE/LUE  and RLE/LLE as well as improvement in awareness.  Working with energy conservation techniques.  Patient ambulates 80 feet without assistive device contact-guard assist working on balance and  endurance.  Performed 2x10 sit to stand from elevated mat supervision.  Up-and-down 6 stairs 12 steps bilateral handrails and supervision.  He can ambulate up to 120 feet to the day room supervision with rolling walker.  Patient ambulates to the toilet with rolling walker distant supervision.  Completed toileting task modified independent from standard toilet with grab bar for assistance with sit to stand.  Full family teaching completed plan discharge to home       Disposition: Discharge disposition: 01-Home or Self Care     Discharge to home   Diet: Mechanical soft  Special Instructions: No  driving smoking or alcohol  Follow-up gastroenterology for repeat EGD  Follow-up with primary care provider on monitoring of potassium levels while on Lasix therapy.  Medications at discharge 1.  Tylenol as needed 2.  Albuterol inhaler as needed 3.  Suboxone 2 tablets daily 4.  Lasix 80 mg in the a.m. 40 mg in the p.m. 5.  Neurontin 800 mg every 8 hours 6.  Lisinopril 15 mg daily 7.  Zaroxolyn  10 mg p.o. daily 8.  Protonix 40 mg p.o. twice daily 9.  MiraLAX daily hold for loose stools 10.  Potassium chloride 40 mEq p.o.  daily 11.  Pravachol 20 mg p.o. daily 12.  Lyrica 25 mg p.o. daily 13.  Senokot S2 tablets p.o. twice daily     Follow-up Information    Jamse Arn, MD Follow up.   Specialty: Physical Medicine and Rehabilitation Why: Only as needed Contact information: [place] [address] [phone]        Doran Stabler, MD Follow up.   Specialty: Gastroenterology Why: Call for appointment Contact information: [place]  [address] [phone]           Signed: [name] 03/23/2019, 5:10 AM

## 2019-03-21 NOTE — Progress Notes (Signed)
Physical Therapy Session Note  Patient Details  Name: Charles Gray MRN: [id number] Date of Birth: 1956/01/05  Today's Date: 03/21/2019 PT Individual Time: 6226-3335 PT Individual Time Calculation (min): 43 min   Short Term Goals: Week 1:  PT Short Term Goal 1 (Week 1): pt will transfer stand<>pivot with LRAD CGA PT Short Term Goal 2 (Week 1): Pt will ambulate 44f with LRAD min A PT Short Term Goal 3 (Week 1): pt will perform car transfer with LRAD CGA  Skilled Therapeutic Interventions/Progress Updates:    Pt seated in w/c upon PT arrival, agreeable to therapy tx and denies pain. Pt transported to the gym. Pt ambulated x 80 ft without AD and with CGA working on balance and endurance. Pt performed 2 x 10 sit<>stands from the elevated mat this session without UE support working on LE strengthening, cues for techniques. Pt worked on dynamic standing balance this session to perform toe taps on aerobic step 2x 10 per LE, performed lateral toe taps with each LE x 10, CGA. Pt ambulated to the steps with RW and supervision x 60 ft, ascended/descended 12 steps this session with B rails and supervision. Pt ambulated x 120 ft to the dayroom with supervision and RW. [facility], pt reported feeling like he was sliding off, decided not to do this activity. Ambulated back to room and left in w/c with needs in reach and chair alarm set.   Therapy Documentation Precautions:  Precautions Precautions: Fall Precaution Comments: monitor SpO2 with activity Restrictions Weight Bearing Restrictions: No    Therapy/Group: Individual Therapy  ENetta Corrigan PT, DPT, CSRS 03/21/2019, 1:23 PM

## 2019-03-21 NOTE — Progress Notes (Signed)
Physical Therapy Session Note  Patient Details  Name: Charles Gray MRN: [id number] Date of Birth: April 16, 1955  Today's Date: 03/21/2019 PT Individual Time: 1125-1154 PT Individual Time Calculation (min): 29 min   Short Term Goals: Week 1:  PT Short Term Goal 1 (Week 1): pt will transfer stand<>pivot with LRAD CGA PT Short Term Goal 2 (Week 1): Pt will ambulate 41f with LRAD min A PT Short Term Goal 3 (Week 1): pt will perform car transfer with LRAD CGA  Skilled Therapeutic Interventions/Progress Updates:    Session focused on functional gait with and without AD, stair negotiation for home entry, toileting, and education in regards to d/c planning and home set-up. Pt performed basic transfers throughout session at supervision to modified independent with RW, some transfers without AD with supervision level for safety due to decreased balance with out UE support. Gait without AD with CGA x 60' with cues for upright posture but limited due to back pain. Pt performed 16 steps with single rail (sideways) either on R or L if can't reach both at home (unsure) with overall supervision and no episodes of LOB noted. Pt did perform toileting (+void) during session in ADL apartment at overall supervision/modified independent level.   Therapy Documentation Precautions:  Precautions Precautions: Fall Precaution Comments: monitor SpO2 with activity Restrictions Weight Bearing Restrictions: No Pain: Pain Assessment Pain Scale: 0-10 Pain Score: 0-No pain   Locomotion : Gait Gait velocity: 1.26 ft/sec    Therapy/Group: Individual Therapy  GCanary BrimBIvory Gray PT, DPT, CBIS  03/21/2019, 12:27 PM

## 2019-03-21 NOTE — Progress Notes (Signed)
Occupational Therapy Session Note  Patient Details  Name: Charles Gray MRN: [id number] Date of Birth: 12-12-1955  Today's Date: 03/21/2019 OT Individual Time: 6144-3154 OT Individual Time Calculation (min): 57 min    Short Term Goals: Week 1:  OT Short Term Goal 1 (Week 1): Pt will complete LB dressing with min assist OT Short Term Goal 2 (Week 1): Pt will complete toilet tranfers with Supervision with LRAD OT Short Term Goal 3 (Week 1): Pt will complete 2 grooming tasks in standing with supervision to demonstrate increased endurance/activity tolerance OT Short Term Goal 4 (Week 1): Pt will complete bathing with min assist  Skilled Therapeutic Interventions/Progress Updates:    Treatment session with focus on functional mobility and activity tolerance.  Pt received seated EOB reporting need to toilet.  Pt ambulated to toilet with RW distant supervision.  Pt completed toileting tasks at Mod I from standard toilet with grab bar for assist with sit > stand.  Engaged in prolonged discussion regarding recommended equipment and what hospital/insurance can provide and what pt will need to obtain.  Pt reporting feeling more fatigued this session and attributing minimal sleep overnight.  Provided pt with energy conservation handout and engaged in education regarding strategies as pertaining to meal prep and housekeeping tasks.  Pt ambulated 180' with RW with supervision with one standing rest break ~90'.  Pt reports plan to talk with son to have him attend a therapy session prior to d/c home.    Therapy Documentation Precautions:  Precautions Precautions: Fall Precaution Comments: monitor SpO2 with activity Restrictions Weight Bearing Restrictions: No Pain: Pt with c/o pain in back.  RN notified.  Therapy/Group: Individual Therapy  Simonne Come 03/21/2019, 12:01 PM

## 2019-03-21 NOTE — Progress Notes (Signed)
Physical Therapy Session Note  Patient Details  Name: Charles Gray MRN: [id number] Date of Birth: 1956/02/22  Today's Date: 03/21/2019 PT Individual Time: 0900-1014 PT Individual Time Calculation (min): 74 min   Short Term Goals: Week 1:  PT Short Term Goal 1 (Week 1): pt will transfer stand<>pivot with LRAD CGA PT Short Term Goal 2 (Week 1): Pt will ambulate 3f with LRAD min A PT Short Term Goal 3 (Week 1): pt will perform car transfer with LRAD CGA  Skilled Therapeutic Interventions/Progress Updates:   Received pt sitting EOB with RN present delivering medication. Pt agreeable to therapy and stated he his having his regular pain in his low back and legs but did not state pain number. Pt reported he felt "weird" this morning and described the feeling as drowsy. Pt mentioned he is still worried about going home, stating he is worried about spending the first few nights alone. Therapist provided emotional support and encouragement to patient. Pt performed sit<>stand with RW with supervision. Pt performed WC mobility 1228fwith bilateral UE's and supervision. Pt navigated 16 steps with bilateral rails CGA. Pt ambulated 16831fith RW supervision. Pt demonstrated increased trunk flexion with gait but stated it was too painful to stand upright. Standing without UE support pt perfromed alternating toe taps 2x10 heavy CGA. Pt requested to exercise on Nustep. Pt ambulated 5ft63fth RW supervision to NuStep. Pt performed LE strengthening x3 mins 13 seconds on workload 6 and UE strengthening x3 mins 13 seconds on workload 6 for a total of 309 steps. Pt reported fatigue after activity and required seated rest and water break. Concluded session with pt sitting EOB, needs within reach, and bed alarm not on per routine.   Therapy Documentation Precautions:  Precautions Precautions: Fall Precaution Comments: monitor SpO2 with activity Restrictions Weight Bearing Restrictions: No  Therapy/Group:  Individual Therapy AnnaAlfonse Alpers DPT   03/21/2019, 7:40 AM

## 2019-03-22 ENCOUNTER — Inpatient Hospital Stay (HOSPITAL_COMMUNITY): Payer: Medicare Other

## 2019-03-22 ENCOUNTER — Inpatient Hospital Stay (HOSPITAL_COMMUNITY): Payer: Medicare Other | Admitting: Occupational Therapy

## 2019-03-22 ENCOUNTER — Encounter (HOSPITAL_COMMUNITY): Payer: Medicare Other | Admitting: Psychology

## 2019-03-22 LAB — BASIC METABOLIC PANEL
Anion gap: 16 — ABNORMAL HIGH (ref 5–15)
BUN: 27 mg/dL — ABNORMAL HIGH (ref 8–23)
CO2: 31 mmol/L (ref 22–32)
Calcium: 8.8 mg/dL — ABNORMAL LOW (ref 8.9–10.3)
Chloride: 89 mmol/L — ABNORMAL LOW (ref 98–111)
Creatinine, Ser: 1.01 mg/dL (ref 0.61–1.24)
GFR calc Af Amer: >60 mL/min (ref >60)
GFR calc non Af Amer: >60 mL/min (ref >60)
Glucose, Bld: 138 mg/dL — ABNORMAL HIGH (ref 70–99)
Potassium: 3 mmol/L — ABNORMAL LOW (ref 3.5–5.1)
Sodium: 136 mmol/L (ref 135–145)

## 2019-03-22 MED ORDER — PREGABALIN 25 MG PO CAPS
25.0000 mg | ORAL_CAPSULE | Freq: Two times a day (BID) | ORAL | Status: DC
  Administered 2019-03-22 – 2019-03-23 (×2): 25 mg via ORAL
  Filled 2019-03-22 (×2): qty 1

## 2019-03-22 MED ORDER — POTASSIUM CHLORIDE CRYS ER 20 MEQ PO TBCR
40.0000 meq | EXTENDED_RELEASE_TABLET | Freq: Two times a day (BID) | ORAL | Status: DC
  Administered 2019-03-22 – 2019-03-23 (×3): 40 meq via ORAL
  Filled 2019-03-22 (×3): qty 2

## 2019-03-22 NOTE — Progress Notes (Signed)
Occupational Therapy Session Note  Patient Details  Name: Charles Gray MRN: [id number] Date of Birth: [birth year]  Today's Date: 03/22/2019 OT Individual Time: 1224-4975 OT Individual Time Calculation (min): 73 min    Short Term Goals: Week 1:  OT Short Term Goal 1 (Week 1): Pt will complete LB dressing with min assist OT Short Term Goal 2 (Week 1): Pt will complete toilet tranfers with Supervision with LRAD OT Short Term Goal 3 (Week 1): Pt will complete 2 grooming tasks in standing with supervision to demonstrate increased endurance/activity tolerance OT Short Term Goal 4 (Week 1): Pt will complete bathing with min assist  Skilled Therapeutic Interventions/Progress Updates:    Completed ADL retraining with bathing at sit > stand level in room shower.  Pt ambulated around room with RW at Mod I level to gather clothing prior to shower.  Able to retrieve clothing from floor post shower with UE support on RW to maintain balance.  Completed dressing at sit > stand level from EOB at Mod I level.  Pt continues to express concerns about managing higher level IADLs (such as taking garbage downstairs and to dumpster).  Discussed recommendation to have a neighbor or his son take his garbage for him as this therapist does not recommend pt carrying anything up/down stairs or carrying anything while managing RW.    Spoke with SWK to reiterate need for son to check in on patient intermittently to provide assist with IADLs and provide transportation to appointments.    Encouraged son to attend therapy sessions yesterday or today, but has not been present at this point.  Therapy Documentation Precautions:  Precautions Precautions: Fall Precaution Comments: monitor SpO2 with activity Restrictions Weight Bearing Restrictions: No Pain: Pain Assessment Pain Scale: 0-10 Pain Score: 5  Pain Type: Chronic pain;Acute pain Pain Location: Back Pain Orientation: Mid;Lower Pain Descriptors / Indicators:  Aching Pain Intervention(s): RN made aware;Repositioned;Shower   Therapy/Group: Individual Therapy  Simonne Come 03/22/2019, 9:42 AM

## 2019-03-22 NOTE — Progress Notes (Signed)
Physical Therapy Discharge Summary  Patient Details  Name: Charles Gray MRN: [id number] Date of Birth: 1955-10-18  Today's Date: 03/22/2019 PT Individual Time: 1300-1400 PT Individual Time Calculation (min): 60 min   Patient has met 11 of 11 long term goals due to improved activity tolerance, improved balance, improved postural control, increased strength, decreased pain and improved coordination. Patient to discharge at an ambulatory level Modified Independent. Pt's son and daughter in law available to provide occasional physical assistance. Family did not attend family education due to son's busy work schedule. Son with decreased understanding of pt's limitations per pt report.   All goals met  Recommendation:  Patient will benefit from ongoing skilled PT services in home health setting to continue to advance safe functional mobility, address ongoing impairments in transfers, amubulation, stair navigation, and minimize fall risk.  Equipment: RW  Reasons for discharge: treatment goals met  Patient/family agrees with progress made and goals achieved: Yes   Today's interventions Received pt sitting upright in WC, pt agreeable to therapy, and did not state pain level during session. Session focused on functional mobility/transfers, discharge planning, ambulation, LE strength, balance/coordination, and improved endurance. Pt performed WC mobility 90fx1 and 1068fx 1 with bilateral UE's independently. Pt ambulated >15025fith RW Mod I. Pt performed bilateral seated hip flexion x20 reps and bilateral knee extension x10 reps. Pt transferred sit<>stand without UE support x 8 reps with supervision. Pt required multiple seated rest and water breaks throughout session. Pt performed car transfer with RW mod I and ambulated 58f53f uneven surfaces (ramp) Mod I with RW. Pt performed seated UBE forwards x3 minutes and backwards x 3 minutes at level 4 with supervision at RPM >30. Pt reported it was much  harder going backwards versus forwards. Concluded session with pt sitting in WC, needs within reach, and placing dinner order.   PT Discharge Precautions/Restrictions Precautions Precautions: Fall Precaution Comments: monitor SpO2 with activity Restrictions Weight Bearing Restrictions: No Cognition Overall Cognitive Status: Within Functional Limits for tasks assessed Arousal/Alertness: Awake/alert Orientation Level: Oriented X4 Memory: Appears intact Awareness: Appears intact Problem Solving: Appears intact Safety/Judgment: Appears intact Sensation Sensation Light Touch: Impaired by gross assessment Peripheral sensation comments: decreased bilaterally below knees Proprioception: Appears Intact Coordination Gross Motor Movements are Fluid and Coordinated: No Fine Motor Movements are Fluid and Coordinated: No Coordination and Movement Description: Mild incoordination but compensates with RW Finger Nose Finger Test: WFL [facility] Test: decreased R>L Motor  Motor Motor: Abnormal postural alignment and control Motor - Skilled Clinical Observations: Mild uncoordination without RW  Mobility Bed Mobility Bed Mobility: Rolling Right;Rolling Left;Supine to Sit;Sit to Supine Rolling Right: Independent Rolling Left: Independent Supine to Sit: Independent Sit to Supine: Independent Transfers Transfers: Sit to Stand;Stand Pivot Transfers;Stand to Sit Sit to Stand: Independent with assistive device Stand to Sit: Independent with assistive device Stand Pivot Transfers: Independent with assistive device Stand Pivot Transfer Details: Verbal cues for safe use of DME/AE Stand Pivot Transfer Details (indicate cue type and reason): Verbal cues for RW safety Transfer (Assistive device): Rolling walker Locomotion  Gait Ambulation: Yes Gait Assistance: Independent with assistive device Gait Distance (Feet): 168 Feet Assistive device: Rolling walker Gait Assistance Details: Verbal cues for  safe use of DME/AE Gait Assistance Details: verbal cues for RW safety Gait Gait: Yes Gait Pattern: Impaired Gait Pattern: Decreased stride length;Wide base of support;Decreased trunk rotation;Trunk flexed;Poor foot clearance - left;Poor foot clearance - right Gait velocity: decreased Stairs / Additional Locomotion Stairs: Yes Stairs Assistance:  Independent with assistive device Stair Management Technique: One rail Left Number of Stairs: 16 Height of Stairs: 6 Wheelchair Mobility Wheelchair Mobility: Yes Wheelchair Assistance: Independent with Camera operator: Both upper extremities Wheelchair Parts Management: Supervision/cueing Distance: 13f  Trunk/Postural Assessment  Cervical Assessment Cervical Assessment: Within Functional Limits Thoracic Assessment Thoracic Assessment: Within Functional Limits Lumbar Assessment Lumbar Assessment: Within Functional Limits Postural Control Postural Control: Deficits on evaluation  Balance Balance Balance Assessed: Yes Static Sitting Balance Static Sitting - Balance Support: No upper extremity supported Static Sitting - Level of Assistance: 7: Independent Dynamic Sitting Balance Dynamic Sitting - Balance Support: No upper extremity supported Dynamic Sitting - Level of Assistance: 7: Independent Static Standing Balance Static Standing - Balance Support: Bilateral upper extremity supported Static Standing - Level of Assistance: 6: Modified independent (Device/Increase time) Dynamic Standing Balance Dynamic Standing - Balance Support: Bilateral upper extremity supported Dynamic Standing - Level of Assistance: 6: Modified independent (Device/Increase time) Extremity Assessment  RLE Assessment RLE Assessment: Exceptions to [facility] Strength Comments: grossly generalized to 4/5 LLE Assessment LLE Assessment: Exceptions to [facility] Strength Comments: Grossly generalized to 4/5  AAlfonse AlpersPT, DPT  03/22/2019, 11:27 AM

## 2019-03-22 NOTE — Progress Notes (Signed)
Social Work Patient ID: Charles Gray, male   DOB: 02/05/1956, 63 y.o.   MRN: [id number]  Met with pt to discuss team conference progress toward his goals-mod/i in room today. Pt doing better with potassium given yesterday. Aware tub bench will be delivered to home and walker coming to room today. Still trying to get follow up for him and PCS started. Feels ready for discharge tomorrow.  

## 2019-03-22 NOTE — Progress Notes (Signed)
Charles Gray PHYSICAL MEDICINE & REHABILITATION PROGRESS NOTE  Subjective/Complaints: He is sleeping well at night when he receives the Ambien.  Has more energy this morning. Feels that Lyrica may have helped with his neuropathic leg pain.  Requests pill form of K+ supplement rather than packet.    ROS: Denies CP, shortness of breath, nausea, vomiting, diarrhea.  Objective: Vital Signs: Blood pressure (!) 110/50, pulse 74, temperature 98 F (36.7 C), temperature source Oral, resp. rate 18, height 6' 10"  (2.083 m), weight (!) 175.5 kg, SpO2 97 %. No results found. No results for input(s): WBC, HGB, HCT, PLT in the last 72 hours. Recent Labs    03/20/19 1102 03/22/19 0534  NA 137 136  K 2.8* 3.0*  CL 88* 89*  CO2 35* 31  GLUCOSE 100* 138*  BUN 19 27*  CREATININE 0.95 1.01  CALCIUM 9.3 8.8*    Physical Exam: BP (!) 110/50 (BP Location: Left Arm)   Pulse 74   Temp 98 F (36.7 C) (Oral)   Resp 18   Ht 6' 10"  (2.083 m)   Wt (!) 175.5 kg   SpO2 97%   BMI 40.46 kg/m  Constitutional: No distress . Vital signs reviewed.  Morbid obesity.  HENT: Normocephalic.  Atraumatic. Eyes: EOMI. No discharge. Cardiovascular: No JVD. Respiratory: Normal effort.  No stridor. GI: Non-distended.  Positive bowel sounds soft nontender palpation Skin: Vascular changes bilateral lower extremities. Psych: Normal mood.  Normal behavior. Improved energy Musc: Bilateral lower extremities with pitting edema. Neurological:  Alert Makes good eye contact follows commands.   Motor: Right upper extremity: 5/5 proximal distal Left upper extremity: 5/5 proximal distal Right lower extremity: 4-/5 proximal distal, stable Left lower extremity: 4/5 proximal to distal, stable  Assessment/Plan: 1. Functional deficits secondary to debility which require 3+ hours per day of interdisciplinary therapy in a comprehensive inpatient rehab setting.  Physiatrist is providing close team supervision and 24 hour  management of active medical problems listed below.  Physiatrist and rehab team continue to assess barriers to discharge/monitor patient progress toward functional and medical goals  Care Tool:  Bathing    Body parts bathed by patient: Right arm, Left arm, Chest, Abdomen, Front perineal area, Right upper leg, Left upper leg, Face, Buttocks, Right lower leg, Left lower leg   Body parts bathed by helper: Buttocks, Right lower leg, Left lower leg     Bathing assist Assist Level: Independent with assistive device     Upper Body Dressing/Undressing Upper body dressing   What is the patient wearing?: Pull over shirt    Upper body assist Assist Level: Independent with assistive device    Lower Body Dressing/Undressing Lower body dressing      What is the patient wearing?: Pants, Underwear/pull up     Lower body assist Assist for lower body dressing: Independent with assitive device     Toileting Toileting    Toileting assist Assist for toileting: Independent with assistive device     Transfers Chair/bed transfer  Transfers assist     Chair/bed transfer assist level: Independent with assistive device     Locomotion Ambulation   Ambulation assist      Assist level: Contact Guard/Touching assist Assistive device: No Device Max distance: 80 ft   Walk 10 feet activity   Assist     Assist level: Contact Guard/Touching assist Assistive device: No Device   Walk 50 feet activity   Assist Walk 50 feet with 2 turns activity did not occur: Safety/medical concerns  Assist level: Contact Guard/Touching assist Assistive device: No Device    Walk 150 feet activity   Assist Walk 150 feet activity did not occur: Safety/medical concerns  Assist level: Supervision/Verbal cueing Assistive device: Walker-rolling    Walk 10 feet on uneven surface  activity   Assist Walk 10 feet on uneven surfaces activity did not occur: Safety/medical concerns          Wheelchair     Assist Will patient use wheelchair at discharge?: Yes Type of Wheelchair: Manual    Wheelchair assist level: Supervision/Verbal cueing Max wheelchair distance: 141f    Wheelchair 50 feet with 2 turns activity    Assist        Assist Level: Supervision/Verbal cueing   Wheelchair 150 feet activity     Assist Wheelchair 150 feet activity did not occur: Safety/medical concerns   Assist Level: Supervision/Verbal cueing      Medical Problem List and Plan: 1.  Debility secondary to GI bleed/hypovolemic/hemorrhagic shock/acute hypoxic respiratory failure. Self extubated 03/06/2019.  Continue CIR PT, OT  -Interdisciplinary team conference today 2.  Antithrombotics: -DVT/anticoagulation: SCDs.    Vascular study reviewed, negative for DVT             -antiplatelet therapy: N/A 3. Pain Management/chronic pain management: Cymbalta 30 mg daily, Suboxone 2 tablets daily, Neurontin 800 mg every 8 hours  Controlled on 12/4  Continuing to have burning pain radiating into his thigh. Will start Lyrica 262mdaily since he is having good benefit from Gabapentin but is already on high dose.   12/9: Lyrica well tolerated, pain improved, energy improved. Will increase dose to 2542mID for better control of night time pain.  4. Mood: Provide emotional support             -antipsychotic agents: N/A 5. Neuropsych: This patient is capable of making decisions on his own behalf. 6. Skin/Wound Care: Routine skin checks 7. Fluids/Electrolytes/Nutrition: Routine in and outs 8. Hypertension.  Lasix as directed  Zaroxolyn 5 mg daily, increased to 10 on 12/3  Lisinopril 30 mg daily, decreased to 15 on 12/3   Vitals:   03/21/19 1924 03/22/19 0430  BP: (!) 106/45 (!) 110/50  Pulse: 82 74  Resp: 20 18  Temp: 98.2 F (36.8 C) 98 F (36.7 C)  SpO2: 98% 97%  controlled 12/6  Monitor with increased mobility 9. GI bleed.  Follow-up gastroenterology services.  Plan EGD  follow-up 2 weeks (~12/15).  Continue Protonix twice daily 10. Morbid obesity.  BMI 42.60.   11. Cirrhosis/hepatic steatosis.    LFTs within normal limits on 12/1  Continue to monitor 12. Thrombocytopenia/anemia.  Felt to be related to cirrhosis.    Hemoglobin 9.1 on 12/4  Platelets 174 on 12/4  Continue to monitor 13. Hyperlipidemia.  Pravachol 14. Hypokalemia  Potassium 3.3 on 12/3.  Supplemented initiated  K+ 2.8 on 12/7, will supplement with 23m42moday and repeat BMP tomorrow.   12/9: Changed K+ supplement to Kdur since patient prefers pill form. Continue 40me57mD. K+ 3.0 today. Repeat BMP tomorrow.  serum Mg wnl  Continue to monitor 15. Hypoalbuminemia  Supplement initiated on 12/1 16. Lower extremity edema-chronic  Cont lasix  Zaroxolyn increased on 12/3 Filed Weights   03/20/19 0429 03/21/19 0508 03/22/19 0430  Weight: (!) 179.6 kg (!) 175.5 kg (!) 175.5 kg   Fluctuating wts 17.  Drug-induced constipation- small stool today as well   Bowel meds increased senna S 2 po BID  18.  Hyponatremia  Sodium 134 on 12/3, 137 on 12/7  LOS: 9 days A FACE TO FACE EVALUATION WAS PERFORMED  Tarrance Januszewski [place] 03/22/2019, 10:07 AM

## 2019-03-22 NOTE — Progress Notes (Signed)
Physical Therapy Session Note  Patient Details  Name: Charles Gray MRN: [id number] Date of Birth: 09-10-1955  Today's Date: 03/22/2019 PT Individual Time: 1000-1057 PT Individual Time Calculation (min): 57 min   Short Term Goals: Week 1:  PT Short Term Goal 1 (Week 1): pt will transfer stand<>pivot with LRAD CGA PT Short Term Goal 2 (Week 1): Pt will ambulate 37f with LRAD min A PT Short Term Goal 3 (Week 1): pt will perform car transfer with LRAD CGA  Skilled Therapeutic Interventions/Progress Updates:   Received pt supine in bed, pt agreeable to therapy, and did not report any pain during session. Session focused on functional mobility/transfers, discharge planning, stair navigation with RW, ambulation, LE strength, balance/coordination, and improved endurance with activity. Pt ambulated 181fwith RW to bathroom mod I and performed hygiene and LE clothing management independently. Pt picked up underwear and pants from floor mod I. [facility]. Pt navigated 12 steps x 1 and 16 steps x 1 with 1 rail carrying folded RW up/down stairs Mod I. Pt reported fatigue after activity and required multiple seated rest and water breaks. Pt required verbal cues for technique and verbalized confidence at end of activity. Pt stated that he likes to sit on the staircase sometimes at home and requested to try activity today. Pt ambulated 50f750fith RW mod I to staircase, sat on second step, and required 4 attempts to stand using biilateral rails for support and supervision. Therapist recommended that pt not sit on staircase at home and if he wants fresh air, sit outside on his balcony, pt agreed. Concluded session with pt sitting in WC, needs within reach, pt made Mod I in room.   Therapy Documentation Precautions:  Precautions Precautions: Fall Precaution Comments: monitor SpO2 with activity Restrictions Weight Bearing Restrictions: No   Therapy/Group:  Individual Therapy AnnAlfonse Alpers, DPT   03/22/2019, 7:38 AM

## 2019-03-22 NOTE — Patient Care Conference (Signed)
Inpatient RehabilitationTeam Conference and Plan of Care Update Date: 03/22/2019   Time: 11:35 AM   Patient Name: Charles Gray      Medical Record Number: [id number]  Date of Birth: Jan 29, 1956 Sex: Male         Room/Bed: 4W03C/4W03C-01 Payor Info: Payor: MEDICARE / Plan: MEDICARE PART A AND B / Product Type: *No Product type* /    Admit Date/Time:  03/13/2019  3:51 PM  Primary Diagnosis:  Debility  Patient Active Problem List   Diagnosis Date Noted  . Hyponatremia   . Benign essential HTN   . Drug induced constipation   . Peripheral edema   . Hypotension due to drugs   . Hypoalbuminemia due to protein-calorie malnutrition ([place])   . Hypokalemia   . Thrombocytopenia ([place])   . Anemia of chronic disease   . Cirrhosis of liver without ascites ([place])   . Morbid obesity ([place])   . Chronic pain syndrome   . Debility 03/13/2019  . Acute respiratory failure with hypoxia ([place])   . Shock ([place])   . Dyspnea   . GIB (gastrointestinal bleeding) 03/04/2019  . Hyperlipidemia 01/24/2019  . Hypertension 01/24/2019  . Lumbar disc herniation 01/24/2019  . Insomnia 01/24/2019    Expected Discharge Date: Expected Discharge Date: 03/23/19  Team Members Present: Physician leading conference: Dr. Leeroy Cha Social Worker Present: Ovidio Kin, LCSW Nurse Present: Dorthula Nettles, RN Case Manager: Karene Fry, RN PT Present: Becky Sax, PT OT Present: Simonne Come, OT PPS Coordinator present : Gunnar Fusi, SLP     Current Status/Progress Goal Weekly Team Focus  Bowel/Bladder   Patient remains continent of B/B, continue schedule and prn stool softener and laxatives orders . LBM 03/21/19  Maintain continence  Assess and address toileting needs in a timely manner, notify MD/PA if concerns or issues   Swallow/Nutrition/ Hydration             ADL's   Distant supervision, approaching Mod I with ADLs and functional mobility with RW  Mod I  ADL retraining, activity tolerance, home making  tasks, pt/family education, d/c planning   Mobility   bed mobility supervision, transfers supervision with RW, ambulation 117f with RW supervision, 16 steps 2 rails supervision  Mod I with LRAD  transfers, ambulation, stair navigation, LE strength, balance, improved endurance   Communication             Safety/Cognition/ Behavioral Observations            Pain   Patient has acute and chronic pain to arms and legs,( cramping) and lower back(chronic) rating pain discomfort range from 6-9/10 on pain scale, prn Tylenol 6573mrelief at times  Pain goal < 4,  Continue to assess and evaluate QS /PRN pain and discomfort, Notify MD/PA as neeed   Skin   Skin dry/flaky with crackling areas to BLE( feet),ecchymotic areas improving  to abdomen, ,buttock, cocyx area barrier cream, MASD, edema LE's +3  Maintain skin integrity  Assess QS/PRN and address concerns      *See Care Plan and progress notes for long and short-term goals.     Barriers to Discharge  Current Status/Progress Possible Resolutions Date Resolved   Nursing                  PT  Decreased caregiver support                 OT  SLP                SW                Discharge Planning/Teaching Needs:  Reachd mod/i level goals, will need assist with some home management tasks, unsure if son will assist, feels can do on own and doesn't acknowledge Dad's deficits.      Team Discussion: Fatigue yesterday, lyrica started yesterday, K+ 2.8, will supplement.  RN cont B/B, cramping in arms yesterday, tylenol for back pain.  OT mod I in room, showered, much improved today.  PT mod I overall, met goals, 16 stairs with walker practiced at mod I level.   Revisions to Treatment Plan: N/A     Medical Summary Current Status: Debility secondary to GI bleed/hypovolemic/hemorrhagic shock/acute hypoxic respiratory failure. Self extubated 03/06/2019. Weekly Focus/Goal: Change in pain medication, K+ supplementation, discussion  regarding getting home health aide for him  Barriers to Discharge: Decreased family/caregiver support;Medical stability   Possible Resolutions to Barriers: Communication with family, K+ supplementation, trial of pain medication   Continued Need for Acute Rehabilitation Level of Care: The patient requires daily medical management by a physician with specialized training in physical medicine and rehabilitation for the following reasons: Direction of a multidisciplinary physical rehabilitation program to maximize functional independence : Yes Medical management of patient stability for increased activity during participation in an intensive rehabilitation regime.: Yes Analysis of laboratory values and/or radiology reports with any subsequent need for medication adjustment and/or medical intervention. : Yes   I attest that I was present, lead the team conference, and concur with the assessment and plan of the team.   [name] 03/22/2019, 10:28 PM  Team conference was held via web/ teleconference due to [place] - 19

## 2019-03-22 NOTE — Consult Note (Signed)
Neuropsychological Consultation   Patient:   Charles Gray   DOB:   04/23/1955  MR Number:  [id number]  Location:  [place] [id number] [place] [address] Dept: [place]: [phone]           Date of Service:   03/22/2019  Start Time:   2 PM End Time:   3 PM  Provider/Observer:  Ilean Skill, Psy.D.       Clinical Neuropsychologist       Billing Code/Service: [phone]  Chief Complaint:    Charles Gray is a 63 year old right-handed male with a history of hyperlipidemia, asthma, cirrhosis, hypertension, and herniated disc maintained on Suboxone 2 tablets daily and continue to be followed by pain management, morbid obesity.  Presented 03/02/2019 to [facility] with increased shortness of breath and leg edema.  The patient had a significant acute drop in blood pressure as well as hemoglobin and indications of a GI bleed.  He was transferred to [facility] for further evaluation.  They found a large amount of fresh and clotted blood in the gastric fundus and in the proximal gastric body.  There was a change of lesions on a fold in the distal gastric fundus with purple discoloration.  After the patient self extubated on 03/06/2019 there were initial bouts of confusion and agitation.  Cranial CT was negative.  The patient improved but continued to have difficulties and was referred for the comprehensive rehabilitation program.  Reason for Service:  Patient was referred for neuropsychological consultation due to coping and adjustment issues.  Below is the HPI for the current admission.  HPI: Charles Gray is a 63 year old right-handed male with history of hyperlipidemia, asthma, cirrhosis, hypertension, herniated disc maintained on suboxone 2 tablets daily followed by pain management, morbid obesity BMI 42.60.  Per chart review patient lives alone.  Independent prior to  admission his son does do the grocery shopping for him.  1 level home with stairs to entry.  Presented 03/02/2019 to [facility] with increasing shortness of breath leg edema.  During his hospital course acute drop in blood pressure as well as hemoglobin with melanotic stool.  He had increasing leukocytosis and lactic acidosis but was afebrile.  Placed on broad-spectrum antibiotics.  CT of abdomen pelvis and abdominal CTA without obvious source of bleeding.  Hemoglobin did drop from 14-7.5.  He was given 3 units packed red blood cells placed on Protonix and transferred to [facility] 03/04/2019 for further evaluation.  Patient did require intubation for airway protection.  Underwent upper GI endoscopy 03/05/2019 per gastroenterology Dr. Loletha Carrow with no varices seen.  A large amount of fresh and clotted blood was found in the gastric fundus and in the proximal gastric body.  Normal esophagus.  Very mild portal hypertensive gastropathy.  There was a chain of lesions on a fold in the distal gastric fundus with purple discoloration.  Patient self extubated 03/06/2019.  Patient with initial bouts of confusion and agitation cranial CT scan 03/07/2019  Negative.  Hemoglobin is stabilized 8.4.  Recommendations are currently to repeat EGD in 2 months and continue Protonix twice daily.  He is tolerating a mechanical soft diet.  Noted thrombocytopenia improved to 111-136 felt to be reactive versus cirrhosis.  Therapy evaluations completed patient was admitted for a comprehensive rehab program  Current Status:  The patient was still having some difficulty comprehending all the things that it happened to him.  He was feeling like he was much improved physically but knows he has a long way to go to regain his strength.  The patient was very complementary of the PA in the direction that he was given by both the PA as well as his attending.  Behavioral Observation: Niclas Markell  presents as a 63 y.o.-year-old  Right Caucasian Male who appeared his stated age. his dress was Appropriate and he was Well Groomed and his manners were Appropriate to the situation.  his participation was indicative of Appropriate and Redirectable behaviors.  There were any physical disabilities noted.  he displayed an appropriate level of cooperation and motivation.     Interactions:    Active Appropriate and Redirectable  Attention:   abnormal and attention span appeared shorter than expected for age  Memory:   within normal limits; recent and remote memory intact  Visuo-spatial:  within normal limits  Speech (Volume):  low  Speech:   normal; normal  Thought Process:  Coherent and Relevant  Though Content:  WNL; not suicidal and not homicidal  Orientation:   person, place, time/date and situation  Judgment:   Fair  Planning:   Fair  Affect:    Appropriate  Mood:    Dysphoric  Insight:   Good  Intelligence:   normal  Medical History:   Past Medical History:  Diagnosis Date  . Fatty liver disease, nonalcoholic   . Hyperlipidemia 01/24/2019  . Hypertension 01/24/2019  . Insomnia 01/24/2019  . Lumbar disc herniation 01/24/2019     Psychiatric History:  No prior psychiatric history  Family Med/Psych History:  Family History  Problem Relation Age of Onset  . Hyperlipidemia Mother   . Hypertension Mother   . Stroke Mother   . CAD Maternal Grandmother   . CAD Maternal Grandfather    Impression/DX:  Charles Gray is a 63 year old right-handed male with a history of hyperlipidemia, asthma, cirrhosis, hypertension, and herniated disc maintained on Suboxone 2 tablets daily and continue to be followed by pain management, morbid obesity.  Presented 03/02/2019 to [facility] with increased shortness of breath and leg edema.  The patient had a significant acute drop in blood pressure as well as hemoglobin and indications of a GI bleed.  He was transferred to [facility] for further  evaluation.  They found a large amount of fresh and clotted blood in the gastric fundus and in the proximal gastric body.  There was a change of lesions on a fold in the distal gastric fundus with purple discoloration.  After the patient self extubated on 03/06/2019 there were initial bouts of confusion and agitation.  Cranial CT was negative.  The patient improved but continued to have difficulties and was referred for the comprehensive rehabilitation program.  The patient was still having some difficulty comprehending all the things that it happened to him.  He was feeling like he was much improved physically but knows he has a long way to go to regain his strength.  The patient was very complementary of the PA in the direction that he was given by both the PA as well as his attending.  Disposition/Plan:  Worked on coping and adjustment issues and planning on how he is going to adapt once he is discharged tomorrow.  Diagnosis:    Debility        Electronically Signed   _______________________ Ilean Skill, Psy.D.

## 2019-03-22 NOTE — Patient Care Conference (Deleted)
Inpatient RehabilitationTeam Conference and Plan of Care Update Date: 03/22/2019   Time: 11:38 AM    Patient Name: Charles Gray      Medical Record Number: [id number]  Date of Birth: Jul 23, 1955 Sex: Male         Room/Bed: 4W03C/4W03C-01 Payor Info: Payor: MEDICARE / Plan: MEDICARE PART A AND B / Product Type: *No Product type* /    Admit Date/Time:  03/13/2019  3:51 PM  Primary Diagnosis:  Debility  Patient Active Problem List   Diagnosis Date Noted  . Hyponatremia   . Benign essential HTN   . Drug induced constipation   . Peripheral edema   . Hypotension due to drugs   . Hypoalbuminemia due to protein-calorie malnutrition ([place])   . Hypokalemia   . Thrombocytopenia ([place])   . Anemia of chronic disease   . Cirrhosis of liver without ascites ([place])   . Morbid obesity ([place])   . Chronic pain syndrome   . Debility 03/13/2019  . Acute respiratory failure with hypoxia ([place])   . Shock ([place])   . Dyspnea   . GIB (gastrointestinal bleeding) 03/04/2019  . Hyperlipidemia 01/24/2019  . Hypertension 01/24/2019  . Lumbar disc herniation 01/24/2019  . Insomnia 01/24/2019    Expected Discharge Date: Expected Discharge Date: 03/23/19  Team Members Present:       Current Status/Progress Goal Weekly Team Focus  Bowel/Bladder   Patient remains continent of B/B, continue schedule and prn stool softener and laxatives orders . LBM 03/21/19  Maintain continence  Assess and address toileting needs in a timely manner, notify MD/PA if concerns or issues   Swallow/Nutrition/ Hydration             ADL's   Distant supervision, approaching Mod I with ADLs and functional mobility with RW  Mod I  ADL retraining, activity tolerance, home making tasks, pt/family education, d/c planning   Mobility   bed mobility supervision, transfers supervision with RW, ambulation 16f with RW supervision, 16 steps 2 rails supervision  Mod I with LRAD  transfers, ambulation, stair navigation, LE strength, balance,  improved endurance   Communication             Safety/Cognition/ Behavioral Observations            Pain   Patient has acute and chronic pain to arms and legs,( cramping) and lower back(chronic) rating pain discomfort range from 6-9/10 on pain scale, prn Tylenol 6569mrelief at times  Pain goal < 4,  Continue to assess and evaluate QS /PRN pain and discomfort, Notify MD/PA as neeed   Skin   Skin dry/flaky with crackling areas to BLE( feet),ecchymotic areas improving  to abdomen, ,buttock, cocyx area barrier cream, MASD, edema LE's +3  Maintain skin integrity  Assess QS/PRN and address concerns      *See Care Plan and progress notes for long and short-term goals.     Barriers to Discharge  Current Status/Progress Possible Resolutions Date Resolved   Nursing                  PT  Decreased caregiver support                 OT                  SLP                SW  Discharge Planning/Teaching Needs:  Reachd mod/i level goals, will need assist with some home management tasks, unsure if son will assist, feels can do on own and doesn't acknowledge Dad's deficits.      Team Discussion:  ModI in room with therapy, concerned with iADLs, advised that he will need his son's help, practiced going up and down 16 stairs with walker and he accomplished ModI  Revisions to Treatment Plan:  Change in pain medication, K+ supplementation, discussion regarding getting home health aide for him    Medical Summary     Debility secondary to GI bleed/hypovolemic/hemorrhagic shock/acute hypoxic respiratory failure.Self extubated 03/06/2019.  Lyrica well tolerated, pain improved, energy improved. Will increase dose to 50m BID for better control of night time pain.   Changed K+ supplement to Kdur since patient prefers pill form. Continue 458m BID. K+ 3.0 today, will repeat once more before discharge tomorrow.             I attest that I was present, lead the team conference,  and concur with the assessment and plan of the team.   KrIzora Ribas2/12/2018, 11:38 AM

## 2019-03-22 NOTE — Progress Notes (Signed)
Occupational Therapy Discharge Summary  Patient Details  Name: Charles Gray MRN: [id number] Date of Birth: 10-22-1955  Patient has met 10 of 10 long term goals due to improved activity tolerance, improved balance, ability to compensate for deficits and improved awareness.  Patient to discharge at overall Modified Independent level.  Patient's care partner is independent to provide the necessary physical assistance at discharge, primarily with higher level IADLs.    Reasons goals not met: N/A  Recommendation:  Patient will benefit from ongoing skilled OT services in home health setting to continue to advance functional skills in the area of BADL, iADL and Reduce care partner burden.  Equipment: bariatric tub transfer bench  Reasons for discharge: treatment goals met and discharge from hospital  Patient/family agrees with progress made and goals achieved: Yes  OT Discharge Precautions/Restrictions  Precautions Precautions: Fall Precaution Comments: monitor SpO2 with activity Restrictions Weight Bearing Restrictions: No Pain Pain Assessment Pain Scale: 0-10 Pain Score: 5  Pain Type: Chronic pain;Acute pain Pain Location: Back Pain Orientation: Mid;Lower Pain Descriptors / Indicators: Aching Pain Intervention(s): RN made aware;Repositioned;Shower ADL ADL Eating: Independent Grooming: Modified independent Where Assessed-Grooming: Standing at sink Upper Body Bathing: Modified independent Where Assessed-Upper Body Bathing: Shower Lower Body Bathing: Modified independent Where Assessed-Lower Body Bathing: Shower Upper Body Dressing: Modified independent (Device) Where Assessed-Upper Body Dressing: Edge of bed Lower Body Dressing: Modified independent Where Assessed-Lower Body Dressing: Edge of bed Toileting: Modified independent Where Assessed-Toileting: Glass blower/designer: Diplomatic Services operational officer Method: Counselling psychologist: Architectural technologist: Modified independent Clinical cytogeneticist Method: Educational psychologist: Gaffer Baseline Vision/History: Wears glasses Wears Glasses: Reading only Patient Visual Report: No change from baseline Vision Assessment?: No apparent visual deficits Cognition Overall Cognitive Status: Within Functional Limits for tasks assessed Arousal/Alertness: Awake/alert Orientation Level: Oriented X4 Memory: Appears intact Awareness: Appears intact Problem Solving: Appears intact Safety/Judgment: Appears intact Sensation Sensation Light Touch: Impaired Detail Peripheral sensation comments: absent sensation from ankles to feet, decreased in lower leg, and reports mild numbness in Rt hand Light Touch Impaired Details: Impaired RUE;Absent RLE;Absent LLE Proprioception: Appears Intact Coordination Gross Motor Movements are Fluid and Coordinated: No Fine Motor Movements are Fluid and Coordinated: No Coordination and Movement Description: Mild incoordination but compensates with RW Finger Nose Finger Test: WFL, mild tremors Motor  Motor Motor: Abnormal postural alignment and control Motor - Skilled Clinical Observations: Mild uncoordination without RW Mobility  Bed Mobility Bed Mobility: Rolling Right;Rolling Left;Supine to Sit;Sit to Supine Rolling Right: Independent Rolling Left: Independent Supine to Sit: Independent Sit to Supine: Independent Transfers Sit to Stand: Independent with assistive device Stand to Sit: Independent with assistive device  Trunk/Postural Assessment  Cervical Assessment Cervical Assessment: Within Functional Limits Thoracic Assessment Thoracic Assessment: Within Functional Limits Lumbar Assessment Lumbar Assessment: Within Functional Limits  Extremity/Trunk Assessment RUE Assessment RUE Assessment: Within Functional Limits Active Range of Motion (AROM) Comments: WNL General Strength Comments: grossly 4/5 LUE  Assessment LUE Assessment: Within Functional Limits General Strength Comments: grossly 5/5   Conway Fedora, [facility] 03/22/2019, 10:32 AM

## 2019-03-22 NOTE — Discharge Instructions (Signed)
Inpatient Rehab Discharge Instructions  [place] Discharge date and time: No discharge date for patient encounter.   Activities/Precautions/ Functional Status: Activity: activity as tolerated Diet: soft Wound Care: none needed Functional status:  ___ No restrictions     ___ Walk up steps independently ___ 24/7 supervision/assistance   ___ Walk up steps with assistance ___ Intermittent supervision/assistance  ___ Bathe/dress independently ___ Walk with walker     _x__ Bathe/dress with assistance ___ Walk Independently    ___ Shower independently ___ Walk with assistance    ___ Shower with assistance ___ No alcohol     ___ Return to work/school ________  Special Instructions: No driving smoking or alcohol  Follow-up gastroenterology services 2 weeks for EGD   COMMUNITY REFERRALS UPON DISCHARGE:    Home Health:   [name]   Date of last service:03/23/2019  Medical Equipment/Items Ordered:BARIATRIC ROLLING WALKER & BARIATRIC TUB BENCH-DELIVERING TO HOME  Agency/Supplier:[facility]   [phone]  Other:[facility] [place]    My questions have been answered and I understand these instructions. I will adhere to these goals and the provided educational materials after my discharge from the hospital.  Patient/Caregiver Signature _______________________________ Date __________  Clinician Signature _______________________________________ Date __________  Please bring this form and your medication list with you to all your follow-up doctor's appointments.

## 2019-03-23 LAB — BASIC METABOLIC PANEL
Anion gap: 16 — ABNORMAL HIGH (ref 5–15)
BUN: 28 mg/dL — ABNORMAL HIGH (ref 8–23)
CO2: 31 mmol/L (ref 22–32)
Calcium: 9.1 mg/dL (ref 8.9–10.3)
Chloride: 89 mmol/L — ABNORMAL LOW (ref 98–111)
Creatinine, Ser: 1.21 mg/dL (ref 0.61–1.24)
GFR calc Af Amer: >60 mL/min (ref >60)
GFR calc non Af Amer: >60 mL/min (ref >60)
Glucose, Bld: 130 mg/dL — ABNORMAL HIGH (ref 70–99)
Potassium: 3.4 mmol/L — ABNORMAL LOW (ref 3.5–5.1)
Sodium: 136 mmol/L (ref 135–145)

## 2019-03-23 MED ORDER — VENTOLIN HFA 108 (90 BASE) MCG/ACT IN AERS: 1.0000 | INHALATION_SPRAY | RESPIRATORY_TRACT | 1 refills | Status: AC | PRN

## 2019-03-23 MED ORDER — LISINOPRIL 5 MG PO TABS: 15.0000 mg | ORAL_TABLET | Freq: Every day | ORAL | 1 refills | Status: DC

## 2019-03-23 MED ORDER — POTASSIUM CHLORIDE ER 10 MEQ PO TBCR: 40.0000 meq | EXTENDED_RELEASE_TABLET | Freq: Every day | ORAL | 1 refills | Status: DC

## 2019-03-23 MED ORDER — GABAPENTIN 800 MG PO TABS: 800.0000 mg | ORAL_TABLET | Freq: Three times a day (TID) | ORAL | 1 refills | Status: DC

## 2019-03-23 MED ORDER — ACETAMINOPHEN 325 MG PO TABS: 650.0000 mg | ORAL_TABLET | ORAL | Status: DC | PRN

## 2019-03-23 MED ORDER — POTASSIUM CHLORIDE CRYS ER 20 MEQ PO TBCR: 40.0000 meq | EXTENDED_RELEASE_TABLET | Freq: Every day | ORAL | 1 refills | Status: DC

## 2019-03-23 MED ORDER — LISINOPRIL 5 MG PO TABS
15.0000 mg | ORAL_TABLET | Freq: Every day | ORAL | Status: DC
  Administered 2019-03-23: 15 mg via ORAL
  Filled 2019-03-23: qty 1

## 2019-03-23 MED ORDER — POLYETHYLENE GLYCOL 3350 17 G PO PACK: 17.0000 g | PACK | Freq: Every day | ORAL | 0 refills | Status: DC

## 2019-03-23 MED ORDER — PREGABALIN 25 MG PO CAPS: 25.0000 mg | ORAL_CAPSULE | Freq: Two times a day (BID) | ORAL | 1 refills | Status: DC

## 2019-03-23 MED ORDER — PRAVASTATIN SODIUM 20 MG PO TABS: 20.0000 mg | ORAL_TABLET | Freq: Every day | ORAL | 1 refills | Status: DC

## 2019-03-23 MED ORDER — PANTOPRAZOLE SODIUM 40 MG PO TBEC: 40.0000 mg | DELAYED_RELEASE_TABLET | Freq: Two times a day (BID) | ORAL | 0 refills | Status: DC

## 2019-03-23 MED ORDER — FUROSEMIDE 40 MG PO TABS: ORAL_TABLET | ORAL | 1 refills | Status: DC

## 2019-03-23 MED ORDER — METOLAZONE 10 MG PO TABS: 10.0000 mg | ORAL_TABLET | Freq: Every day | ORAL | 1 refills | Status: DC

## 2019-03-23 NOTE — Progress Notes (Signed)
Pt given discharge instructions via PA with family at bedside. All questions and concerns were addressed. Pt discharged home.

## 2019-03-23 NOTE — Progress Notes (Signed)
Social Work Discharge Note   The overall goal for the admission was met for:   Discharge location: [place] IN ON HIM INTERMITTENTLY  Length of Stay: Yes-10 DAYS  Discharge activity level: Yes-MOD/I LEVEL  Home/community participation: Yes  Services provided included: MD, RD, PT, OT, RN, CM, TR, Pharmacy, Neuropsych and SW  Financial Services: Medicare and Medicaid  Follow-up services arranged: Home Health: [facility]-PT & OT, DME: [facility]-BARIATRIC ROLLING WALKER AND BARIATRIC TUB BENCH and Patient/Family has no preference for HH/DME agencies  Comments (or additional information):SON WAS HERE FOR A THERAPY SESSION. PT NEEDS ASSIST WITH HOME MANAGEMENT-COOKING, CLEANING AND TAKING TRASH OUT-SON DOESN;T SEEM TO UNDERSTAND THIS. HAVE STARTED PCS PAPERWORK FOR PT TO GET AN AIDE 2 HRS A FEW DAYS PER WEEK  Patient/Family verbalized understanding of follow-up arrangements: Yes  Individual responsible for coordination of the follow-up plan: SELF & JASON-SON  Confirmed correct DME delivered: Elease Hashimoto 03/23/2019    Elease Hashimoto

## 2019-03-24 ENCOUNTER — Telehealth: Payer: Self-pay

## 2019-03-24 DIAGNOSIS — I509 Heart failure, unspecified: Secondary | ICD-10-CM | POA: Diagnosis not present

## 2019-03-24 DIAGNOSIS — E46 Unspecified protein-calorie malnutrition: Secondary | ICD-10-CM | POA: Diagnosis not present

## 2019-03-24 DIAGNOSIS — D649 Anemia, unspecified: Secondary | ICD-10-CM | POA: Diagnosis not present

## 2019-03-24 DIAGNOSIS — J45909 Unspecified asthma, uncomplicated: Secondary | ICD-10-CM | POA: Diagnosis not present

## 2019-03-24 DIAGNOSIS — K922 Gastrointestinal hemorrhage, unspecified: Secondary | ICD-10-CM | POA: Diagnosis not present

## 2019-03-24 DIAGNOSIS — I110 Hypertensive heart disease with heart failure: Secondary | ICD-10-CM | POA: Diagnosis not present

## 2019-03-24 DIAGNOSIS — M5126 Other intervertebral disc displacement, lumbar region: Secondary | ICD-10-CM | POA: Diagnosis not present

## 2019-03-24 DIAGNOSIS — G894 Chronic pain syndrome: Secondary | ICD-10-CM | POA: Diagnosis not present

## 2019-03-24 DIAGNOSIS — D696 Thrombocytopenia, unspecified: Secondary | ICD-10-CM | POA: Diagnosis not present

## 2019-03-24 DIAGNOSIS — K7460 Unspecified cirrhosis of liver: Secondary | ICD-10-CM | POA: Diagnosis not present

## 2019-03-24 DIAGNOSIS — E876 Hypokalemia: Secondary | ICD-10-CM | POA: Diagnosis not present

## 2019-03-24 DIAGNOSIS — Z6841 Body Mass Index (BMI) 40.0 and over, adult: Secondary | ICD-10-CM | POA: Diagnosis not present

## 2019-03-24 DIAGNOSIS — D62 Acute posthemorrhagic anemia: Secondary | ICD-10-CM | POA: Diagnosis not present

## 2019-03-24 DIAGNOSIS — E785 Hyperlipidemia, unspecified: Secondary | ICD-10-CM | POA: Diagnosis not present

## 2019-03-24 NOTE — Telephone Encounter (Signed)
Amber, PT from [facility] called requesting verbal orders for HHPT 1wk1, 2wk2, 1wk5 and for RN to get medication in order and SW eval. Is it ok for RN and SW eval?

## 2019-03-27 DIAGNOSIS — K922 Gastrointestinal hemorrhage, unspecified: Secondary | ICD-10-CM | POA: Diagnosis not present

## 2019-03-27 DIAGNOSIS — K7460 Unspecified cirrhosis of liver: Secondary | ICD-10-CM | POA: Diagnosis not present

## 2019-03-27 DIAGNOSIS — D696 Thrombocytopenia, unspecified: Secondary | ICD-10-CM | POA: Diagnosis not present

## 2019-03-27 DIAGNOSIS — I509 Heart failure, unspecified: Secondary | ICD-10-CM | POA: Diagnosis not present

## 2019-03-27 DIAGNOSIS — D62 Acute posthemorrhagic anemia: Secondary | ICD-10-CM | POA: Diagnosis not present

## 2019-03-27 DIAGNOSIS — I110 Hypertensive heart disease with heart failure: Secondary | ICD-10-CM | POA: Diagnosis not present

## 2019-03-27 NOTE — Telephone Encounter (Signed)
That's fine. Thanks!

## 2019-03-28 DIAGNOSIS — K7460 Unspecified cirrhosis of liver: Secondary | ICD-10-CM | POA: Diagnosis not present

## 2019-03-28 DIAGNOSIS — K922 Gastrointestinal hemorrhage, unspecified: Secondary | ICD-10-CM | POA: Diagnosis not present

## 2019-03-28 DIAGNOSIS — I509 Heart failure, unspecified: Secondary | ICD-10-CM | POA: Diagnosis not present

## 2019-03-28 DIAGNOSIS — I110 Hypertensive heart disease with heart failure: Secondary | ICD-10-CM | POA: Diagnosis not present

## 2019-03-28 DIAGNOSIS — D696 Thrombocytopenia, unspecified: Secondary | ICD-10-CM | POA: Diagnosis not present

## 2019-03-28 DIAGNOSIS — D62 Acute posthemorrhagic anemia: Secondary | ICD-10-CM | POA: Diagnosis not present

## 2019-03-28 NOTE — Telephone Encounter (Signed)
Amber has been notified orders approved and given.

## 2019-03-29 ENCOUNTER — Telehealth: Payer: Self-pay | Admitting: *Deleted

## 2019-03-29 DIAGNOSIS — K922 Gastrointestinal hemorrhage, unspecified: Secondary | ICD-10-CM | POA: Diagnosis not present

## 2019-03-29 DIAGNOSIS — I110 Hypertensive heart disease with heart failure: Secondary | ICD-10-CM | POA: Diagnosis not present

## 2019-03-29 DIAGNOSIS — D696 Thrombocytopenia, unspecified: Secondary | ICD-10-CM | POA: Diagnosis not present

## 2019-03-29 DIAGNOSIS — D62 Acute posthemorrhagic anemia: Secondary | ICD-10-CM | POA: Diagnosis not present

## 2019-03-29 DIAGNOSIS — I509 Heart failure, unspecified: Secondary | ICD-10-CM | POA: Diagnosis not present

## 2019-03-29 DIAGNOSIS — K7460 Unspecified cirrhosis of liver: Secondary | ICD-10-CM | POA: Diagnosis not present

## 2019-03-29 NOTE — Telephone Encounter (Signed)
Lonn Georgia, OT, [facility] left a message asking for verbal orders for [facility] 1week4Medical record reviewed. Social work note reviewed.  Verbal orders given per office protocol. to address endurance, energy conservation, home safety and ADL independence.

## 2019-03-30 DIAGNOSIS — K7460 Unspecified cirrhosis of liver: Secondary | ICD-10-CM | POA: Diagnosis not present

## 2019-03-30 DIAGNOSIS — I110 Hypertensive heart disease with heart failure: Secondary | ICD-10-CM | POA: Diagnosis not present

## 2019-03-30 DIAGNOSIS — K922 Gastrointestinal hemorrhage, unspecified: Secondary | ICD-10-CM | POA: Diagnosis not present

## 2019-03-30 DIAGNOSIS — D62 Acute posthemorrhagic anemia: Secondary | ICD-10-CM | POA: Diagnosis not present

## 2019-03-30 DIAGNOSIS — I509 Heart failure, unspecified: Secondary | ICD-10-CM | POA: Diagnosis not present

## 2019-03-30 DIAGNOSIS — D696 Thrombocytopenia, unspecified: Secondary | ICD-10-CM | POA: Diagnosis not present

## 2019-03-31 ENCOUNTER — Other Ambulatory Visit: Payer: Medicare Other

## 2019-04-03 DIAGNOSIS — I110 Hypertensive heart disease with heart failure: Secondary | ICD-10-CM | POA: Diagnosis not present

## 2019-04-03 DIAGNOSIS — D62 Acute posthemorrhagic anemia: Secondary | ICD-10-CM | POA: Diagnosis not present

## 2019-04-03 DIAGNOSIS — D696 Thrombocytopenia, unspecified: Secondary | ICD-10-CM | POA: Diagnosis not present

## 2019-04-03 DIAGNOSIS — I509 Heart failure, unspecified: Secondary | ICD-10-CM | POA: Diagnosis not present

## 2019-04-03 DIAGNOSIS — K922 Gastrointestinal hemorrhage, unspecified: Secondary | ICD-10-CM | POA: Diagnosis not present

## 2019-04-03 DIAGNOSIS — K7460 Unspecified cirrhosis of liver: Secondary | ICD-10-CM | POA: Diagnosis not present

## 2019-04-04 ENCOUNTER — Telehealth: Payer: Self-pay | Admitting: *Deleted

## 2019-04-04 DIAGNOSIS — I509 Heart failure, unspecified: Secondary | ICD-10-CM | POA: Diagnosis not present

## 2019-04-04 DIAGNOSIS — K922 Gastrointestinal hemorrhage, unspecified: Secondary | ICD-10-CM | POA: Diagnosis not present

## 2019-04-04 DIAGNOSIS — D696 Thrombocytopenia, unspecified: Secondary | ICD-10-CM | POA: Diagnosis not present

## 2019-04-04 DIAGNOSIS — K7460 Unspecified cirrhosis of liver: Secondary | ICD-10-CM | POA: Diagnosis not present

## 2019-04-04 DIAGNOSIS — D62 Acute posthemorrhagic anemia: Secondary | ICD-10-CM | POA: Diagnosis not present

## 2019-04-04 DIAGNOSIS — I110 Hypertensive heart disease with heart failure: Secondary | ICD-10-CM | POA: Diagnosis not present

## 2019-04-04 NOTE — Telephone Encounter (Signed)
Kayla OT w [facility] called to report that Charles Gray had a fall out of bed this morning. He did not show apparent injury and denies hitting head, but he is c/o 8/10 pain in shoulder and hip. He is up moving around as normal.  He does not have a follow up appt with Dr Posey Pronto, per discharge summary it was follow up as necessary.  Please advise.

## 2019-04-04 NOTE — Telephone Encounter (Signed)
Santiago Glad RN [facility] called to clarify the order for potassium for [name]. According to Dan's discharge note, he is to take 40 meq daily.  She reports he has 10 meq tabs and 20 meq tabs. Combination does not matter as long as it equals 40 meq.

## 2019-04-04 NOTE — Telephone Encounter (Signed)
I gave Charles Gray OT the message.

## 2019-04-04 NOTE — Telephone Encounter (Signed)
If he continues to have pain he should be seen by his primary care physician for evaluation and further work-up. Thanks.

## 2019-04-06 DIAGNOSIS — I509 Heart failure, unspecified: Secondary | ICD-10-CM | POA: Diagnosis not present

## 2019-04-06 DIAGNOSIS — K922 Gastrointestinal hemorrhage, unspecified: Secondary | ICD-10-CM | POA: Diagnosis not present

## 2019-04-06 DIAGNOSIS — D696 Thrombocytopenia, unspecified: Secondary | ICD-10-CM | POA: Diagnosis not present

## 2019-04-06 DIAGNOSIS — K7460 Unspecified cirrhosis of liver: Secondary | ICD-10-CM | POA: Diagnosis not present

## 2019-04-06 DIAGNOSIS — I110 Hypertensive heart disease with heart failure: Secondary | ICD-10-CM | POA: Diagnosis not present

## 2019-04-06 DIAGNOSIS — D62 Acute posthemorrhagic anemia: Secondary | ICD-10-CM | POA: Diagnosis not present

## 2019-04-10 DIAGNOSIS — K922 Gastrointestinal hemorrhage, unspecified: Secondary | ICD-10-CM | POA: Diagnosis not present

## 2019-04-10 DIAGNOSIS — D696 Thrombocytopenia, unspecified: Secondary | ICD-10-CM | POA: Diagnosis not present

## 2019-04-10 DIAGNOSIS — I509 Heart failure, unspecified: Secondary | ICD-10-CM | POA: Diagnosis not present

## 2019-04-10 DIAGNOSIS — D62 Acute posthemorrhagic anemia: Secondary | ICD-10-CM | POA: Diagnosis not present

## 2019-04-10 DIAGNOSIS — I110 Hypertensive heart disease with heart failure: Secondary | ICD-10-CM | POA: Diagnosis not present

## 2019-04-10 DIAGNOSIS — K7460 Unspecified cirrhosis of liver: Secondary | ICD-10-CM | POA: Diagnosis not present

## 2019-04-12 DIAGNOSIS — K922 Gastrointestinal hemorrhage, unspecified: Secondary | ICD-10-CM | POA: Diagnosis not present

## 2019-04-12 DIAGNOSIS — D62 Acute posthemorrhagic anemia: Secondary | ICD-10-CM | POA: Diagnosis not present

## 2019-04-12 DIAGNOSIS — D696 Thrombocytopenia, unspecified: Secondary | ICD-10-CM | POA: Diagnosis not present

## 2019-04-12 DIAGNOSIS — I110 Hypertensive heart disease with heart failure: Secondary | ICD-10-CM | POA: Diagnosis not present

## 2019-04-12 DIAGNOSIS — I509 Heart failure, unspecified: Secondary | ICD-10-CM | POA: Diagnosis not present

## 2019-04-12 DIAGNOSIS — K7460 Unspecified cirrhosis of liver: Secondary | ICD-10-CM | POA: Diagnosis not present

## 2019-04-17 DIAGNOSIS — J45909 Unspecified asthma, uncomplicated: Secondary | ICD-10-CM | POA: Diagnosis not present

## 2019-04-17 DIAGNOSIS — D62 Acute posthemorrhagic anemia: Secondary | ICD-10-CM | POA: Diagnosis not present

## 2019-04-17 DIAGNOSIS — Z6841 Body Mass Index (BMI) 40.0 and over, adult: Secondary | ICD-10-CM

## 2019-04-17 DIAGNOSIS — I509 Heart failure, unspecified: Secondary | ICD-10-CM | POA: Diagnosis not present

## 2019-04-17 DIAGNOSIS — G894 Chronic pain syndrome: Secondary | ICD-10-CM

## 2019-04-17 DIAGNOSIS — E876 Hypokalemia: Secondary | ICD-10-CM | POA: Diagnosis not present

## 2019-04-17 DIAGNOSIS — D696 Thrombocytopenia, unspecified: Secondary | ICD-10-CM | POA: Diagnosis not present

## 2019-04-17 DIAGNOSIS — E785 Hyperlipidemia, unspecified: Secondary | ICD-10-CM | POA: Diagnosis not present

## 2019-04-17 DIAGNOSIS — M5126 Other intervertebral disc displacement, lumbar region: Secondary | ICD-10-CM | POA: Diagnosis not present

## 2019-04-17 DIAGNOSIS — D649 Anemia, unspecified: Secondary | ICD-10-CM | POA: Diagnosis not present

## 2019-04-17 DIAGNOSIS — K7460 Unspecified cirrhosis of liver: Secondary | ICD-10-CM | POA: Diagnosis not present

## 2019-04-17 DIAGNOSIS — I110 Hypertensive heart disease with heart failure: Secondary | ICD-10-CM | POA: Diagnosis not present

## 2019-04-17 DIAGNOSIS — E46 Unspecified protein-calorie malnutrition: Secondary | ICD-10-CM | POA: Diagnosis not present

## 2019-04-17 DIAGNOSIS — K922 Gastrointestinal hemorrhage, unspecified: Secondary | ICD-10-CM | POA: Diagnosis not present

## 2019-04-19 ENCOUNTER — Telehealth: Payer: Self-pay | Admitting: *Deleted

## 2019-04-19 DIAGNOSIS — I509 Heart failure, unspecified: Secondary | ICD-10-CM | POA: Diagnosis not present

## 2019-04-19 DIAGNOSIS — K922 Gastrointestinal hemorrhage, unspecified: Secondary | ICD-10-CM | POA: Diagnosis not present

## 2019-04-19 DIAGNOSIS — K7460 Unspecified cirrhosis of liver: Secondary | ICD-10-CM | POA: Diagnosis not present

## 2019-04-19 DIAGNOSIS — D696 Thrombocytopenia, unspecified: Secondary | ICD-10-CM | POA: Diagnosis not present

## 2019-04-19 DIAGNOSIS — D62 Acute posthemorrhagic anemia: Secondary | ICD-10-CM | POA: Diagnosis not present

## 2019-04-19 DIAGNOSIS — I110 Hypertensive heart disease with heart failure: Secondary | ICD-10-CM | POA: Diagnosis not present

## 2019-04-19 NOTE — Telephone Encounter (Addendum)
Charles Gray OT [facility] request to ext OT [phone].  Approval to be given.Charles Gray's mailbox is full and cannot accept messages. I have called and given ok to [facility] CM.

## 2019-04-21 DIAGNOSIS — Z79899 Other long term (current) drug therapy: Secondary | ICD-10-CM | POA: Diagnosis not present

## 2019-04-21 DIAGNOSIS — K922 Gastrointestinal hemorrhage, unspecified: Secondary | ICD-10-CM | POA: Diagnosis not present

## 2019-04-23 DIAGNOSIS — D649 Anemia, unspecified: Secondary | ICD-10-CM | POA: Diagnosis not present

## 2019-04-23 DIAGNOSIS — Z6841 Body Mass Index (BMI) 40.0 and over, adult: Secondary | ICD-10-CM | POA: Diagnosis not present

## 2019-04-23 DIAGNOSIS — G894 Chronic pain syndrome: Secondary | ICD-10-CM | POA: Diagnosis not present

## 2019-04-23 DIAGNOSIS — J45909 Unspecified asthma, uncomplicated: Secondary | ICD-10-CM | POA: Diagnosis not present

## 2019-04-23 DIAGNOSIS — I110 Hypertensive heart disease with heart failure: Secondary | ICD-10-CM | POA: Diagnosis not present

## 2019-04-23 DIAGNOSIS — E876 Hypokalemia: Secondary | ICD-10-CM | POA: Diagnosis not present

## 2019-04-23 DIAGNOSIS — K7460 Unspecified cirrhosis of liver: Secondary | ICD-10-CM | POA: Diagnosis not present

## 2019-04-23 DIAGNOSIS — E46 Unspecified protein-calorie malnutrition: Secondary | ICD-10-CM | POA: Diagnosis not present

## 2019-04-23 DIAGNOSIS — E785 Hyperlipidemia, unspecified: Secondary | ICD-10-CM | POA: Diagnosis not present

## 2019-04-23 DIAGNOSIS — K922 Gastrointestinal hemorrhage, unspecified: Secondary | ICD-10-CM | POA: Diagnosis not present

## 2019-04-23 DIAGNOSIS — M5126 Other intervertebral disc displacement, lumbar region: Secondary | ICD-10-CM | POA: Diagnosis not present

## 2019-04-23 DIAGNOSIS — I509 Heart failure, unspecified: Secondary | ICD-10-CM | POA: Diagnosis not present

## 2019-04-23 DIAGNOSIS — D696 Thrombocytopenia, unspecified: Secondary | ICD-10-CM | POA: Diagnosis not present

## 2019-04-23 DIAGNOSIS — D62 Acute posthemorrhagic anemia: Secondary | ICD-10-CM | POA: Diagnosis not present

## 2019-04-26 DIAGNOSIS — D62 Acute posthemorrhagic anemia: Secondary | ICD-10-CM | POA: Diagnosis not present

## 2019-04-26 DIAGNOSIS — I110 Hypertensive heart disease with heart failure: Secondary | ICD-10-CM | POA: Diagnosis not present

## 2019-04-26 DIAGNOSIS — K922 Gastrointestinal hemorrhage, unspecified: Secondary | ICD-10-CM | POA: Diagnosis not present

## 2019-04-26 DIAGNOSIS — I509 Heart failure, unspecified: Secondary | ICD-10-CM | POA: Diagnosis not present

## 2019-04-26 DIAGNOSIS — D696 Thrombocytopenia, unspecified: Secondary | ICD-10-CM | POA: Diagnosis not present

## 2019-04-26 DIAGNOSIS — K7460 Unspecified cirrhosis of liver: Secondary | ICD-10-CM | POA: Diagnosis not present

## 2019-04-28 DIAGNOSIS — D62 Acute posthemorrhagic anemia: Secondary | ICD-10-CM | POA: Diagnosis not present

## 2019-04-28 DIAGNOSIS — I509 Heart failure, unspecified: Secondary | ICD-10-CM | POA: Diagnosis not present

## 2019-04-28 DIAGNOSIS — K922 Gastrointestinal hemorrhage, unspecified: Secondary | ICD-10-CM | POA: Diagnosis not present

## 2019-04-28 DIAGNOSIS — D696 Thrombocytopenia, unspecified: Secondary | ICD-10-CM | POA: Diagnosis not present

## 2019-04-28 DIAGNOSIS — K7460 Unspecified cirrhosis of liver: Secondary | ICD-10-CM | POA: Diagnosis not present

## 2019-04-28 DIAGNOSIS — I10 Essential (primary) hypertension: Secondary | ICD-10-CM | POA: Diagnosis not present

## 2019-04-28 DIAGNOSIS — I110 Hypertensive heart disease with heart failure: Secondary | ICD-10-CM | POA: Diagnosis not present

## 2019-05-01 DIAGNOSIS — D696 Thrombocytopenia, unspecified: Secondary | ICD-10-CM | POA: Diagnosis not present

## 2019-05-01 DIAGNOSIS — I110 Hypertensive heart disease with heart failure: Secondary | ICD-10-CM | POA: Diagnosis not present

## 2019-05-01 DIAGNOSIS — D62 Acute posthemorrhagic anemia: Secondary | ICD-10-CM | POA: Diagnosis not present

## 2019-05-01 DIAGNOSIS — I509 Heart failure, unspecified: Secondary | ICD-10-CM | POA: Diagnosis not present

## 2019-05-01 DIAGNOSIS — K922 Gastrointestinal hemorrhage, unspecified: Secondary | ICD-10-CM | POA: Diagnosis not present

## 2019-05-01 DIAGNOSIS — K7460 Unspecified cirrhosis of liver: Secondary | ICD-10-CM | POA: Diagnosis not present

## 2019-05-02 DIAGNOSIS — K922 Gastrointestinal hemorrhage, unspecified: Secondary | ICD-10-CM | POA: Diagnosis not present

## 2019-05-02 DIAGNOSIS — D696 Thrombocytopenia, unspecified: Secondary | ICD-10-CM | POA: Diagnosis not present

## 2019-05-02 DIAGNOSIS — K7460 Unspecified cirrhosis of liver: Secondary | ICD-10-CM | POA: Diagnosis not present

## 2019-05-02 DIAGNOSIS — I509 Heart failure, unspecified: Secondary | ICD-10-CM | POA: Diagnosis not present

## 2019-05-02 DIAGNOSIS — I110 Hypertensive heart disease with heart failure: Secondary | ICD-10-CM | POA: Diagnosis not present

## 2019-05-02 DIAGNOSIS — D62 Acute posthemorrhagic anemia: Secondary | ICD-10-CM | POA: Diagnosis not present

## 2019-05-03 DIAGNOSIS — I10 Essential (primary) hypertension: Secondary | ICD-10-CM | POA: Diagnosis not present

## 2019-05-03 DIAGNOSIS — Z79899 Other long term (current) drug therapy: Secondary | ICD-10-CM | POA: Diagnosis not present

## 2019-05-08 DIAGNOSIS — D696 Thrombocytopenia, unspecified: Secondary | ICD-10-CM | POA: Diagnosis not present

## 2019-05-08 DIAGNOSIS — I110 Hypertensive heart disease with heart failure: Secondary | ICD-10-CM | POA: Diagnosis not present

## 2019-05-08 DIAGNOSIS — D62 Acute posthemorrhagic anemia: Secondary | ICD-10-CM | POA: Diagnosis not present

## 2019-05-08 DIAGNOSIS — K7460 Unspecified cirrhosis of liver: Secondary | ICD-10-CM | POA: Diagnosis not present

## 2019-05-08 DIAGNOSIS — I509 Heart failure, unspecified: Secondary | ICD-10-CM | POA: Diagnosis not present

## 2019-05-08 DIAGNOSIS — K922 Gastrointestinal hemorrhage, unspecified: Secondary | ICD-10-CM | POA: Diagnosis not present

## 2019-05-09 DIAGNOSIS — I509 Heart failure, unspecified: Secondary | ICD-10-CM | POA: Diagnosis not present

## 2019-05-09 DIAGNOSIS — D62 Acute posthemorrhagic anemia: Secondary | ICD-10-CM | POA: Diagnosis not present

## 2019-05-09 DIAGNOSIS — K7460 Unspecified cirrhosis of liver: Secondary | ICD-10-CM | POA: Diagnosis not present

## 2019-05-09 DIAGNOSIS — K922 Gastrointestinal hemorrhage, unspecified: Secondary | ICD-10-CM | POA: Diagnosis not present

## 2019-05-09 DIAGNOSIS — I110 Hypertensive heart disease with heart failure: Secondary | ICD-10-CM | POA: Diagnosis not present

## 2019-05-09 DIAGNOSIS — D696 Thrombocytopenia, unspecified: Secondary | ICD-10-CM | POA: Diagnosis not present

## 2019-05-10 DIAGNOSIS — I10 Essential (primary) hypertension: Secondary | ICD-10-CM | POA: Diagnosis not present

## 2019-05-10 DIAGNOSIS — Z79899 Other long term (current) drug therapy: Secondary | ICD-10-CM | POA: Diagnosis not present

## 2019-05-10 DIAGNOSIS — R600 Localized edema: Secondary | ICD-10-CM | POA: Diagnosis not present

## 2019-05-15 DIAGNOSIS — K7460 Unspecified cirrhosis of liver: Secondary | ICD-10-CM | POA: Diagnosis not present

## 2019-05-15 DIAGNOSIS — K922 Gastrointestinal hemorrhage, unspecified: Secondary | ICD-10-CM | POA: Diagnosis not present

## 2019-05-15 DIAGNOSIS — I110 Hypertensive heart disease with heart failure: Secondary | ICD-10-CM | POA: Diagnosis not present

## 2019-05-15 DIAGNOSIS — I509 Heart failure, unspecified: Secondary | ICD-10-CM | POA: Diagnosis not present

## 2019-05-15 DIAGNOSIS — D62 Acute posthemorrhagic anemia: Secondary | ICD-10-CM | POA: Diagnosis not present

## 2019-05-15 DIAGNOSIS — D696 Thrombocytopenia, unspecified: Secondary | ICD-10-CM | POA: Diagnosis not present

## 2019-05-17 DIAGNOSIS — Z79899 Other long term (current) drug therapy: Secondary | ICD-10-CM | POA: Diagnosis not present

## 2019-05-19 DIAGNOSIS — D62 Acute posthemorrhagic anemia: Secondary | ICD-10-CM | POA: Diagnosis not present

## 2019-05-19 DIAGNOSIS — K7460 Unspecified cirrhosis of liver: Secondary | ICD-10-CM | POA: Diagnosis not present

## 2019-05-19 DIAGNOSIS — I509 Heart failure, unspecified: Secondary | ICD-10-CM | POA: Diagnosis not present

## 2019-05-19 DIAGNOSIS — I110 Hypertensive heart disease with heart failure: Secondary | ICD-10-CM | POA: Diagnosis not present

## 2019-05-19 DIAGNOSIS — K922 Gastrointestinal hemorrhage, unspecified: Secondary | ICD-10-CM | POA: Diagnosis not present

## 2019-05-19 DIAGNOSIS — D696 Thrombocytopenia, unspecified: Secondary | ICD-10-CM | POA: Diagnosis not present

## 2019-05-23 DIAGNOSIS — G894 Chronic pain syndrome: Secondary | ICD-10-CM | POA: Diagnosis not present

## 2019-05-23 DIAGNOSIS — E46 Unspecified protein-calorie malnutrition: Secondary | ICD-10-CM | POA: Diagnosis not present

## 2019-05-23 DIAGNOSIS — Z9181 History of falling: Secondary | ICD-10-CM | POA: Diagnosis not present

## 2019-05-23 DIAGNOSIS — M5126 Other intervertebral disc displacement, lumbar region: Secondary | ICD-10-CM | POA: Diagnosis not present

## 2019-05-23 DIAGNOSIS — K922 Gastrointestinal hemorrhage, unspecified: Secondary | ICD-10-CM | POA: Diagnosis not present

## 2019-05-23 DIAGNOSIS — D62 Acute posthemorrhagic anemia: Secondary | ICD-10-CM | POA: Diagnosis not present

## 2019-05-23 DIAGNOSIS — J45909 Unspecified asthma, uncomplicated: Secondary | ICD-10-CM | POA: Diagnosis not present

## 2019-05-23 DIAGNOSIS — K7460 Unspecified cirrhosis of liver: Secondary | ICD-10-CM | POA: Diagnosis not present

## 2019-05-23 DIAGNOSIS — I509 Heart failure, unspecified: Secondary | ICD-10-CM | POA: Diagnosis not present

## 2019-05-23 DIAGNOSIS — E876 Hypokalemia: Secondary | ICD-10-CM | POA: Diagnosis not present

## 2019-05-23 DIAGNOSIS — I110 Hypertensive heart disease with heart failure: Secondary | ICD-10-CM | POA: Diagnosis not present

## 2019-05-23 DIAGNOSIS — D696 Thrombocytopenia, unspecified: Secondary | ICD-10-CM | POA: Diagnosis not present

## 2019-05-23 DIAGNOSIS — D649 Anemia, unspecified: Secondary | ICD-10-CM | POA: Diagnosis not present

## 2019-05-23 DIAGNOSIS — E785 Hyperlipidemia, unspecified: Secondary | ICD-10-CM | POA: Diagnosis not present

## 2019-05-23 DIAGNOSIS — Z6841 Body Mass Index (BMI) 40.0 and over, adult: Secondary | ICD-10-CM | POA: Diagnosis not present

## 2019-05-25 DIAGNOSIS — J45909 Unspecified asthma, uncomplicated: Secondary | ICD-10-CM | POA: Diagnosis not present

## 2019-05-25 DIAGNOSIS — I110 Hypertensive heart disease with heart failure: Secondary | ICD-10-CM | POA: Diagnosis not present

## 2019-05-25 DIAGNOSIS — D696 Thrombocytopenia, unspecified: Secondary | ICD-10-CM | POA: Diagnosis not present

## 2019-05-25 DIAGNOSIS — I509 Heart failure, unspecified: Secondary | ICD-10-CM | POA: Diagnosis not present

## 2019-05-25 DIAGNOSIS — K7460 Unspecified cirrhosis of liver: Secondary | ICD-10-CM | POA: Diagnosis not present

## 2019-05-25 DIAGNOSIS — D649 Anemia, unspecified: Secondary | ICD-10-CM | POA: Diagnosis not present

## 2019-05-29 ENCOUNTER — Ambulatory Visit: Payer: Medicare Other | Admitting: Cardiology

## 2019-05-29 DIAGNOSIS — I509 Heart failure, unspecified: Secondary | ICD-10-CM | POA: Diagnosis not present

## 2019-05-29 DIAGNOSIS — I110 Hypertensive heart disease with heart failure: Secondary | ICD-10-CM | POA: Diagnosis not present

## 2019-05-29 DIAGNOSIS — D649 Anemia, unspecified: Secondary | ICD-10-CM | POA: Diagnosis not present

## 2019-05-29 DIAGNOSIS — K7460 Unspecified cirrhosis of liver: Secondary | ICD-10-CM | POA: Diagnosis not present

## 2019-05-29 DIAGNOSIS — J45909 Unspecified asthma, uncomplicated: Secondary | ICD-10-CM | POA: Diagnosis not present

## 2019-05-29 DIAGNOSIS — D696 Thrombocytopenia, unspecified: Secondary | ICD-10-CM | POA: Diagnosis not present

## 2019-05-30 ENCOUNTER — Ambulatory Visit (INDEPENDENT_AMBULATORY_CARE_PROVIDER_SITE_OTHER): Payer: Medicare Other | Admitting: Cardiology

## 2019-05-30 ENCOUNTER — Other Ambulatory Visit: Payer: Self-pay

## 2019-05-30 ENCOUNTER — Encounter: Payer: Self-pay | Admitting: Cardiology

## 2019-05-30 VITALS — BP 100/64 | HR 100 | Ht >= 80 in | Wt 384.0 lb

## 2019-05-30 DIAGNOSIS — K7460 Unspecified cirrhosis of liver: Secondary | ICD-10-CM

## 2019-05-30 DIAGNOSIS — I5032 Chronic diastolic (congestive) heart failure: Secondary | ICD-10-CM | POA: Insufficient documentation

## 2019-05-30 DIAGNOSIS — E66812 Obesity, class 2: Secondary | ICD-10-CM

## 2019-05-30 DIAGNOSIS — I10 Essential (primary) hypertension: Secondary | ICD-10-CM | POA: Diagnosis not present

## 2019-05-30 DIAGNOSIS — Z6841 Body Mass Index (BMI) 40.0 and over, adult: Secondary | ICD-10-CM

## 2019-05-30 DIAGNOSIS — K254 Chronic or unspecified gastric ulcer with hemorrhage: Secondary | ICD-10-CM

## 2019-05-30 DIAGNOSIS — E782 Mixed hyperlipidemia: Secondary | ICD-10-CM | POA: Diagnosis not present

## 2019-05-30 HISTORY — DX: Morbid (severe) obesity due to excess calories: E66.01

## 2019-05-30 HISTORY — DX: Chronic diastolic (congestive) heart failure: I50.32

## 2019-05-30 HISTORY — DX: Obesity, class 2: E66.812

## 2019-05-30 MED ORDER — FUROSEMIDE 40 MG PO TABS: 40.0000 mg | ORAL_TABLET | Freq: Two times a day (BID) | ORAL | 1 refills | Status: DC

## 2019-05-30 MED ORDER — POTASSIUM CHLORIDE ER 10 MEQ PO TBCR: 40.0000 meq | EXTENDED_RELEASE_TABLET | Freq: Two times a day (BID) | ORAL | 1 refills | Status: DC

## 2019-05-30 MED ORDER — POTASSIUM CHLORIDE ER 10 MEQ PO TBCR: 20.0000 meq | EXTENDED_RELEASE_TABLET | Freq: Two times a day (BID) | ORAL | 1 refills | Status: DC

## 2019-05-30 NOTE — Patient Instructions (Addendum)
Medication Instructions:  Your physician has recommended you make the following change in your medication:   STOP: Metolazone  CHANGE: Furosemide to 40 mg Take 1 twice daily CHANGE: Potassium to 20 meq (2 tabs of 10 eq) twice daily  *If you need a refill on your cardiac medications before your next appointment, please call your pharmacy*  Lab Work: Your physician recommends that you return for lab work in: TODAY BMP,Magnesium,CBC  If you have labs (blood work) drawn today and your tests are completely normal, you will receive your results only by: Marland Kitchen MyChart Message (if you have MyChart) OR . A paper copy in the mail If you have any lab test that is abnormal or we need to change your treatment, we will call you to review the results.  Testing/Procedures: None  Follow-Up: At [facility], you and your health needs are our priority.  As part of our continuing mission to provide you with exceptional heart care, we have created designated Provider Care Teams.  These Care Teams include your primary Cardiologist (physician) and Advanced Practice Providers (APPs -  Physician Assistants and Nurse Practitioners) who all work together to provide you with the care you need, when you need it.  Your next appointment:   1 month(s)  The format for your next appointment:   In Person  Provider:   Berniece Salines, DO  Other Instructions

## 2019-05-30 NOTE — Progress Notes (Signed)
Cardiology Office Note:    Date:  05/30/2019   ID:  Charles Gray, DOB 06-14-1955, MRN [id number]  PCP:  Enid Skeens., MD  Cardiologist:  Berniece Salines, DO  Electrophysiologist:  None   Referring MD: Enid Skeens., MD   Chief Complaint  Patient presents with  . Follow-up    History of Present Illness:    Charles Tarkentonis a 64 y.o.malewith a hx of hypertension, hyperlipidemia, morbid obesity, history of nonalcoholic fatty liver disease,herniated disc with chronic pain maintained on Suboxone followed by pain management, morbid obesity BMI 42.60.   Patient was initially seen by me on January 24, 2019 at which time he complained of shortness of breath and fatigue. He did tell me that he was experiencing orthopnea and PND. Therefore at the conclusion of the visit start patient on Lasix 40 mg every 12 hours with potassium 20 mEq daily. An echocardiogram was also ordered and patient was referred to GI for his alcoholic fatty liver disease.  In the interim I did see the patient, he appears to be losing weight on the diuretic therapy.  At his initial visit he was 68 LBS and his visit on January 31, 2019 he was 405 pounds.  Although the weight loss the patient still was not euvolemic therefore recommended the patient go to the ED at the [facility] to be given IV diuretics but he declined at that time.  I did see the patient again on February 27, 2019 at that time recommended hospitalization and he declined. I did increase his lasix dose to 80 mg in the morning and 40 mg at night with metolazone.  He called on 03/02/2019 requesting an increase in his diuretics dose at that time I did tell the patient he needed to go to the ED - Thankfully he did go to the ED.  In the interim the patient was hospitalized at [facility] on 03/02/2019 and then was transferred to [facility]. During his hospital course acute drop in blood pressure as well as  hemoglobin with melanotic stool.  He had increasing leukocytosis and lactic acidosis but was afebrile.  Placed on broad-spectrum antibiotics.  CT of abdomen pelvis abdominal CTA without obvious source of bleeding.  Hemoglobin did drop from 14-7.5.  He was given 3 units packed red blood cells placed on Protonix and transferred to [facility] 03/04/2019 for further evaluation.  Patient did require intubation for airway protection.  Upper GI endoscopy 03/05/2019 per gastroenterology Dr. Loletha Carrow with no varices seen.  A large amount of fresh and clotted blood was found in the gastric fundus and in the proximal gastric body.  Very mild portal hypertensive gastropathy.  There was a chain of lesions on a fold in the distal gastric fundus with purple discoloration.  Patient self extubated 03/06/2019.  Bouts initially of confusion agitation cranial CT scan 03/07/2019 showed no acute findings.  Hemoglobin stabilized 8.4. He was noted to have  thrombocytopenia improved 111000-136000 felt to be reactive versus cirrhosis.  He was discharged to rehab. He was able to complete his rehab.  He is here today for a follow up visit and is very happy with his progress. He is using a walker but tells me he feels much improved but notes that he has been off his diuretic due low systolic blood pressure.   Past Medical History:  Diagnosis Date  . Fatty liver disease, nonalcoholic   . Hyperlipidemia 01/24/2019  . Hypertension 01/24/2019  . Insomnia 01/24/2019  .  Lumbar disc herniation 01/24/2019    Past Surgical History:  Procedure Laterality Date  . ESOPHAGOGASTRODUODENOSCOPY (EGD) WITH PROPOFOL N/A 03/05/2019   Procedure: ESOPHAGOGASTRODUODENOSCOPY (EGD) WITH PROPOFOL;  Surgeon: Doran Stabler, MD;  Location: [facility];  Service: Gastroenterology;  Laterality: N/A;  Large Blood Clot Removed  . ESOPHAGOGASTRODUODENOSCOPY (EGD) WITH PROPOFOL N/A 03/06/2019   Procedure: ESOPHAGOGASTRODUODENOSCOPY (EGD) WITH  PROPOFOL;  Surgeon: Thornton Park, MD;  Location: [place];  Service: Gastroenterology;  Laterality: N/A;  . HEMORROIDECTOMY      Current Medications: Current Meds  Medication Sig  . acetaminophen (TYLENOL) 325 MG tablet Take 2 tablets (650 mg total) by mouth every 4 (four) hours as needed for fever or mild pain.  . buprenorphine-naloxone (SUBOXONE) 8-2 mg SUBL SL tablet Place 2 tablets under the tongue daily.   . furosemide (LASIX) 40 MG tablet Take 1 tablet (40 mg total) by mouth 2 (two) times daily.  Marland Kitchen gabapentin (NEURONTIN) 800 MG tablet Take 1 tablet (800 mg total) by mouth 3 (three) times daily.  Marland Kitchen lisinopril (ZESTRIL) 5 MG tablet Take 3 tablets (15 mg total) by mouth daily.  . polyethylene glycol (MIRALAX / GLYCOLAX) 17 g packet Take 17 g by mouth daily.  . potassium chloride (KLOR-CON) 10 MEQ tablet Take 2 tablets (20 mEq total) by mouth 2 (two) times daily.  . pravastatin (PRAVACHOL) 20 MG tablet Take 1 tablet (20 mg total) by mouth daily.  . pregabalin (LYRICA) 25 MG capsule Take 1 capsule (25 mg total) by mouth 2 (two) times daily.  . VENTOLIN HFA 108 (90 Base) MCG/ACT inhaler Inhale 1 puff into the lungs every 4 (four) hours as needed for shortness of breath.  . zolpidem (AMBIEN) 10 MG tablet Take 10 mg by mouth at bedtime as needed.  . [DISCONTINUED] furosemide (LASIX) 40 MG tablet Take 2 tabs (80 mg ) in the Am and 1 tab (40 Mg) in the PM  . [DISCONTINUED] metolazone (ZAROXOLYN) 5 MG tablet Take 5 mg by mouth daily.  . [DISCONTINUED] potassium chloride (KLOR-CON) 10 MEQ tablet Take 4 tablets (40 mEq total) by mouth daily.  . [DISCONTINUED] potassium chloride (KLOR-CON) 10 MEQ tablet Take 4 tablets (40 mEq total) by mouth 2 (two) times daily.     Allergies:   Penicillins   Social History   Socioeconomic History  . Marital status: Unknown    Spouse name: Not on file  . Number of children: Not on file  . Years of education: Not on file  . Highest education level:  Not on file  Occupational History  . Not on file  Tobacco Use  . Smoking status: Never Smoker  . Smokeless tobacco: Never Used  Substance and Sexual Activity  . Alcohol use: Never  . Drug use: Not Currently    Types: Marijuana    Comment: Smoked for 30 years  . Sexual activity: Not on file  Other Topics Concern  . Not on file  Social History Narrative  . Not on file   Social Determinants of Health   Financial Resource Strain:   . Difficulty of Paying Living Expenses: Not on file  Food Insecurity:   . Worried About Charity fundraiser in the Last Year: Not on file  . Ran Out of Food in the Last Year: Not on file  Transportation Needs:   . Lack of Transportation (Medical): Not on file  . Lack of Transportation (Non-Medical): Not on file  Physical Activity:   . Days of Exercise  per Week: Not on file  . Minutes of Exercise per Session: Not on file  Stress:   . Feeling of Stress : Not on file  Social Connections:   . Frequency of Communication with Friends and Family: Not on file  . Frequency of Social Gatherings with Friends and Family: Not on file  . Attends Religious Services: Not on file  . Active Member of Clubs or Organizations: Not on file  . Attends Archivist Meetings: Not on file  . Marital Status: Not on file     Family History: The patient's family history includes CAD in his maternal grandfather and maternal grandmother; Hyperlipidemia in his mother; Hypertension in his mother; Stroke in his mother.  ROS:   Review of Systems  Constitution: Negative for decreased appetite, fever and weight gain.  HENT: Negative for congestion, ear discharge, hoarse voice and sore throat.   Eyes: Negative for discharge, redness, vision loss in right eye and visual halos.  Cardiovascular: Negative for chest pain, dyspnea on exertion, leg swelling, orthopnea and palpitations.  Respiratory: Negative for cough, hemoptysis, shortness of breath and snoring.   Endocrine:  Negative for heat intolerance and polyphagia.  Hematologic/Lymphatic: Negative for bleeding problem. Does not bruise/bleed easily.  Skin: Negative for flushing, nail changes, rash and suspicious lesions.  Musculoskeletal: Negative for arthritis, joint pain, muscle cramps, myalgias, neck pain and stiffness.  Gastrointestinal: Negative for abdominal pain, bowel incontinence, diarrhea and excessive appetite.  Genitourinary: Negative for decreased libido, genital sores and incomplete emptying.  Neurological: Negative for brief paralysis, focal weakness, headaches and loss of balance.  Psychiatric/Behavioral: Negative for altered mental status, depression and suicidal ideas.  Allergic/Immunologic: Negative for HIV exposure and persistent infections.    EKGs/Labs/Other Studies Reviewed:    The following studies were reviewed today:   EKG:  None today:     TTE 03/04/2020 1. Technically difficult study in all windows. Definity contrast shows  hyperdynamic EF, no gross focal wall motion abnormalities but limited  sensitivity for detection of minor abnormalities.  2. Left ventricular ejection fraction, by visual estimation, is 65 to  70%. The left ventricle has hyperdynamic function. There is borderline  left ventricular hypertrophy.  3. Definity contrast agent was given IV to delineate the left ventricular  endocardial borders.  4. The left ventricle has no regional wall motion abnormalities.  5. Global right ventricle has normal systolic function.The right  ventricular size is normal. No increase in right ventricular wall  thickness.  6. Left atrial size was normal.  7. Right atrial size was normal.  8. Presence of pericardial fat pad.  9. Small pericardial effusion.  10. The mitral valve is normal in structure. No evidence of mitral valve  regurgitation.  11. The tricuspid valve is normal in structure. Tricuspid valve  regurgitation is not demonstrated.  12. The aortic valve  is tricuspid. Aortic valve regurgitation is not  visualized. No evidence of aortic valve sclerosis or stenosis.  13. The pulmonic valve was not well visualized. Pulmonic valve  regurgitation is not visualized.  14. TR signal is inadequate for assessing pulmonary artery systolic  pressure.  15. The inferior vena cava is dilated in size with <50% respiratory  variability, suggesting right atrial pressure of 15 mmHg.   Recent Labs: 02/27/2019: NT-Pro BNP 37 03/04/2019: B Natriuretic Peptide 116.9 03/05/2019: TSH 0.809 03/14/2019: ALT 37 03/17/2019: Hemoglobin 9.1; Platelets 174 03/18/2019: Magnesium 1.9 03/23/2019: BUN 28; Creatinine, Ser 1.21; Potassium 3.4; Sodium 136  Recent Lipid Panel  Component Value Date/Time   CHOL 128 01/24/2019 1121   TRIG 161 (H) 03/07/2019 0721   HDL 42 01/24/2019 1121   CHOLHDL 3.0 01/24/2019 1121   LDLCALC 64 01/24/2019 1121    Physical Exam:    VS:  BP 100/64 (BP Location: Left Arm, Patient Position: Sitting, Cuff Size: Large)   Pulse 100   Ht 6' 10"  (2.083 m)   Wt (!) 384 lb (174.2 kg)   SpO2 94%   BMI 40.15 kg/m     Wt Readings from Last 3 Encounters:  05/30/19 (!) 384 lb (174.2 kg)  03/23/19 (!) 387 lb 12.6 oz (175.9 kg)  03/13/19 (!) 407 lb 6.6 oz (184.8 kg)     GEN: Well nourished- obese, well developed in no acute distress HEENT: Normal NECK: No JVD; No carotid bruits LYMPHATICS: No lymphadenopathy CARDIAC: S1S2 noted,RRR, no murmurs, rubs, gallops RESPIRATORY:  Clear to auscultation without rales, wheezing or rhonchi  ABDOMEN: Soft, non-tender, non-distended, +bowel sounds, no guarding. EXTREMITIES: bilateral leg edema +2, No cyanosis, no clubbing MUSCULOSKELETAL:  No deformity  SKIN: Warm and dry NEUROLOGIC:  Alert and oriented x 3, non-focal PSYCHIATRIC:  Normal affect, good insight  ASSESSMENT:    1. Chronic heart failure with preserved ejection fraction ([place])   2. Essential hypertension   3. Gastrointestinal hemorrhage  associated with gastric ulcer   4. Cirrhosis of liver without ascites, unspecified hepatic cirrhosis type ([place])   5. Mixed hyperlipidemia   6. Morbid obesity with BMI of 40.0-44.9, adult ([place])    PLAN:    He appears to be closer to his baseline. With his bilateral leg edema I will restart lasix dose of 40 mg twice a day with potassium supplement. Stop metolazone for now. He is on lisinopril 5 mg daily we will continue this for now.  Continue patient on the pravastatin. Blood work will be done today for BMP, Mg and CBC.  I encouraged him to keep up with his diet and continue to decrease salt intake- his weakness is roman noodles which I educated him that this is heavy in salt.  He is looking forward to doing better.  He going to take his weight daily along with his blood pressure.  The patient is in agreement with the above plan. The patient left the office in stable condition.  The patient will follow up in 1 month or sooner if needed.   Medication Adjustments/Labs and Tests Ordered: Current medicines are reviewed at length with the patient today.  Concerns regarding medicines are outlined above.  Orders Placed This Encounter  Procedures  . Basic Metabolic Panel (BMET)  . Magnesium  . CBC   Meds ordered this encounter  Medications  . furosemide (LASIX) 40 MG tablet    Sig: Take 1 tablet (40 mg total) by mouth 2 (two) times daily.    Dispense:  180 tablet    Refill:  1  . DISCONTD: potassium chloride (KLOR-CON) 10 MEQ tablet    Sig: Take 4 tablets (40 mEq total) by mouth 2 (two) times daily.    Dispense:  240 tablet    Refill:  1  . potassium chloride (KLOR-CON) 10 MEQ tablet    Sig: Take 2 tablets (20 mEq total) by mouth 2 (two) times daily.    Dispense:  120 tablet    Refill:  1    Patient Instructions  Medication Instructions:  Your physician has recommended you make the following change in your medication:   STOP: Metolazone  CHANGE:  Furosemide to 40 mg Take 1 twice  daily CHANGE: Potassium to 20 meq (2 tabs of 10 eq) twice daily  *If you need a refill on your cardiac medications before your next appointment, please call your pharmacy*  Lab Work: Your physician recommends that you return for lab work in: TODAY BMP,Magnesium,CBC  If you have labs (blood work) drawn today and your tests are completely normal, you will receive your results only by: Marland Kitchen MyChart Message (if you have MyChart) OR . A paper copy in the mail If you have any lab test that is abnormal or we need to change your treatment, we will call you to review the results.  Testing/Procedures: None  Follow-Up: At [facility], you and your health needs are our priority.  As part of our continuing mission to provide you with exceptional heart care, we have created designated Provider Care Teams.  These Care Teams include your primary Cardiologist (physician) and Advanced Practice Providers (APPs -  Physician Assistants and Nurse Practitioners) who all work together to provide you with the care you need, when you need it.  Your next appointment:   1 month(s)  The format for your next appointment:   In Person  Provider:   Berniece Salines, DO  Other Instructions      Adopting a Healthy Lifestyle.  Know what a healthy weight is for you (roughly BMI <25) and aim to maintain this   Aim for 7+ servings of fruits and vegetables daily   65-80+ fluid ounces of water or unsweet tea for healthy kidneys   Limit to max 1 drink of alcohol per day; avoid smoking/tobacco   Limit animal fats in diet for cholesterol and heart health - choose grass fed whenever available   Avoid highly processed foods, and foods high in saturated/trans fats   Aim for low stress - take time to unwind and care for your mental health   Aim for 150 min of moderate intensity exercise weekly for heart health, and weights twice weekly for bone health   Aim for 7-9 hours of sleep daily   When it comes to diets,  agreement about the perfect plan isnt easy to find, even among the experts. Experts at the [employer] developed an idea known as the Healthy Eating Plate. Just imagine a plate divided into logical, healthy portions.   The emphasis is on diet quality:   Load up on vegetables and fruits - one-half of your plate: Aim for color and variety, and remember that potatoes dont count.   Go for whole grains - one-quarter of your plate: Whole wheat, barley, wheat berries, quinoa, oats, brown rice, and foods made with them. If you want pasta, go with whole wheat pasta.   Protein power - one-quarter of your plate: Fish, chicken, beans, and nuts are all healthy, versatile protein sources. Limit red meat.   The diet, however, does go beyond the plate, offering a few other suggestions.   Use healthy plant oils, such as olive, canola, soy, corn, sunflower and peanut. Check the labels, and avoid partially hydrogenated oil, which have unhealthy trans fats.   If youre thirsty, drink water. Coffee and tea are good in moderation, but skip sugary drinks and limit milk and dairy products to one or two daily servings.   The type of carbohydrate in the diet is more important than the amount. Some sources of carbohydrates, such as vegetables, fruits, whole grains, and beans-are healthier than others.   Finally,  stay active  Signed, Berniece Salines, DO  05/30/2019 5:22 PM    [facility]

## 2019-05-31 LAB — MAGNESIUM: Magnesium: 2 mg/dL (ref 1.6–2.3)

## 2019-05-31 LAB — CBC
Hematocrit: 36.1 % — ABNORMAL LOW (ref 37.5–51.0)
Hemoglobin: 11.7 g/dL — ABNORMAL LOW (ref 13.0–17.7)
MCH: 25.5 pg — ABNORMAL LOW (ref 26.6–33.0)
MCHC: 32.4 g/dL (ref 31.5–35.7)
MCV: 79 fL (ref 79–97)
Platelets: 202 10*3/uL (ref 150–450)
RBC: 4.59 x10E6/uL (ref 4.14–5.80)
RDW: 15.3 % (ref 11.6–15.4)
WBC: 11.5 10*3/uL — ABNORMAL HIGH (ref 3.4–10.8)

## 2019-05-31 LAB — BASIC METABOLIC PANEL
BUN/Creatinine Ratio: 31 — ABNORMAL HIGH (ref 10–24)
BUN: 45 mg/dL — ABNORMAL HIGH (ref 8–27)
CO2: 26 mmol/L (ref 20–29)
Calcium: 10.2 mg/dL (ref 8.6–10.2)
Chloride: 86 mmol/L — ABNORMAL LOW (ref 96–106)
Creatinine, Ser: 1.47 mg/dL — ABNORMAL HIGH (ref 0.76–1.27)
GFR calc Af Amer: 58 mL/min/{1.73_m2} — ABNORMAL LOW (ref >59)
GFR calc non Af Amer: 50 mL/min/{1.73_m2} — ABNORMAL LOW (ref >59)
Glucose: 102 mg/dL — ABNORMAL HIGH (ref 65–99)
Potassium: 3.7 mmol/L (ref 3.5–5.2)
Sodium: 134 mmol/L (ref 134–144)

## 2019-06-01 ENCOUNTER — Telehealth: Payer: Self-pay | Admitting: *Deleted

## 2019-06-01 NOTE — Telephone Encounter (Signed)
-----   Message from Berniece Salines, DO sent at 05/31/2019 11:06 PM EST ----- Creatinine slightly elevated compared to 2 months ago.  We will monitor you and repeat this at the next visit.  Stay off the Zaroxolyn as directed in the office and only continue the Lasix as directed as well.

## 2019-06-01 NOTE — Telephone Encounter (Signed)
Patient informed and verbalized understanding of plan. Copy sent to PCP 

## 2019-06-02 DIAGNOSIS — I110 Hypertensive heart disease with heart failure: Secondary | ICD-10-CM | POA: Diagnosis not present

## 2019-06-02 DIAGNOSIS — D649 Anemia, unspecified: Secondary | ICD-10-CM | POA: Diagnosis not present

## 2019-06-02 DIAGNOSIS — D696 Thrombocytopenia, unspecified: Secondary | ICD-10-CM | POA: Diagnosis not present

## 2019-06-02 DIAGNOSIS — I509 Heart failure, unspecified: Secondary | ICD-10-CM | POA: Diagnosis not present

## 2019-06-02 DIAGNOSIS — J45909 Unspecified asthma, uncomplicated: Secondary | ICD-10-CM | POA: Diagnosis not present

## 2019-06-02 DIAGNOSIS — K7460 Unspecified cirrhosis of liver: Secondary | ICD-10-CM | POA: Diagnosis not present

## 2019-06-06 ENCOUNTER — Telehealth: Payer: Self-pay | Admitting: Cardiology

## 2019-06-06 DIAGNOSIS — J45909 Unspecified asthma, uncomplicated: Secondary | ICD-10-CM | POA: Diagnosis not present

## 2019-06-06 DIAGNOSIS — D696 Thrombocytopenia, unspecified: Secondary | ICD-10-CM | POA: Diagnosis not present

## 2019-06-06 DIAGNOSIS — K7460 Unspecified cirrhosis of liver: Secondary | ICD-10-CM | POA: Diagnosis not present

## 2019-06-06 DIAGNOSIS — D649 Anemia, unspecified: Secondary | ICD-10-CM | POA: Diagnosis not present

## 2019-06-06 DIAGNOSIS — I110 Hypertensive heart disease with heart failure: Secondary | ICD-10-CM | POA: Diagnosis not present

## 2019-06-06 DIAGNOSIS — I509 Heart failure, unspecified: Secondary | ICD-10-CM | POA: Diagnosis not present

## 2019-06-06 NOTE — Telephone Encounter (Signed)
Left message to call office

## 2019-06-06 NOTE — Telephone Encounter (Signed)
I cannot tell from your dosage what dose of furosemide he took today what ever it was continue the same daily and I think really this is a long-term management question you are asking about going forward with Dr. Harriet Masson for her to comment when she returns on Thursday.

## 2019-06-06 NOTE — Telephone Encounter (Signed)
Patient took extra 40 mg lasix this AM. I spoke with patient and told him Dr Bettina Gavia would like him to take Lasix 80 mg daily in the AM and 40 mg daily in the PM for now.  I told him we would call back with further recommendations once reviewed by Dr Harriet Masson later this week. Patient reports he had problems with low BP in the past.  BP today is 137/65

## 2019-06-06 NOTE — Telephone Encounter (Signed)
Please have him take Zaroxolyn 47m prior to his first lasix dose of the day. Have him take as noted 828mam and 4043mm. I can see him in 2 weeks. Tell him to continue to weigh himself.

## 2019-06-06 NOTE — Telephone Encounter (Signed)
New Message    Pt c/o swelling: STAT is pt has developed SOB within 24 hours  1) How much weight have you gained and in what time span? 9 pounds over night   2) If swelling, where is the swelling located? Swelling in both legs and feet. Pt also noticed around his arms he is noticing some swelling   3) Are you currently taking a fluid pill? Yes, Lasix   4) Are you currently SOB? Yes, when moving around. Started getting worse over the last couple of days   5) Do you have a log of your daily weights (if so, list)? Yes   6) Have you gained 3 pounds in a day or 5 pounds in a week? Yes 9 pounds over night   7) Have you traveled recently?  No

## 2019-06-06 NOTE — Telephone Encounter (Signed)
I spoke with patient. He reports weight this AM was 394 lbs. He took extra lasix and weight has now gone down to 389 lbs. Previous weights- 2/22- 385 lbs                               2/21-386 lbs                               2/20- 382 lbs Did not weigh prior to 2/20. After taking lasix today the swelling in his arms has gone down a little. Still with swelling in feet and legs.  He just put on his compression socks. He is having shortness of breath with exertion. Shortness of breath comes and goes when sitting. He thinks shortness of breath has gotten a little worse since office visit last week with Dr Harriet Masson. At that visit lasix and potassium were resumed and metolazone was stopped. Lab work done that day showed elevated creatinine and it was noted he should stay off Zaroxolyn. Will forward to Dr Bettina Gavia (office DOD)

## 2019-06-07 ENCOUNTER — Other Ambulatory Visit: Payer: Self-pay

## 2019-06-07 ENCOUNTER — Inpatient Hospital Stay (HOSPITAL_COMMUNITY)
Admission: EM | Admit: 2019-06-07 | Discharge: 2019-06-13 | DRG: 175 | Disposition: A | Discharge status: Home Health | Payer: Medicare Other | Attending: Internal Medicine | Admitting: Internal Medicine

## 2019-06-07 ENCOUNTER — Encounter (HOSPITAL_COMMUNITY): Payer: Self-pay | Admitting: Emergency Medicine

## 2019-06-07 DIAGNOSIS — D649 Anemia, unspecified: Secondary | ICD-10-CM | POA: Diagnosis present

## 2019-06-07 DIAGNOSIS — D509 Iron deficiency anemia, unspecified: Secondary | ICD-10-CM | POA: Diagnosis present

## 2019-06-07 DIAGNOSIS — R072 Precordial pain: Secondary | ICD-10-CM | POA: Diagnosis not present

## 2019-06-07 DIAGNOSIS — I10 Essential (primary) hypertension: Secondary | ICD-10-CM | POA: Diagnosis present

## 2019-06-07 DIAGNOSIS — I2699 Other pulmonary embolism without acute cor pulmonale: Secondary | ICD-10-CM | POA: Diagnosis not present

## 2019-06-07 DIAGNOSIS — J9601 Acute respiratory failure with hypoxia: Secondary | ICD-10-CM | POA: Diagnosis not present

## 2019-06-07 DIAGNOSIS — Z20822 Contact with and (suspected) exposure to covid-19: Secondary | ICD-10-CM | POA: Diagnosis present

## 2019-06-07 DIAGNOSIS — I110 Hypertensive heart disease with heart failure: Secondary | ICD-10-CM | POA: Diagnosis present

## 2019-06-07 DIAGNOSIS — R079 Chest pain, unspecified: Secondary | ICD-10-CM | POA: Diagnosis not present

## 2019-06-07 DIAGNOSIS — I509 Heart failure, unspecified: Secondary | ICD-10-CM | POA: Diagnosis not present

## 2019-06-07 DIAGNOSIS — I5032 Chronic diastolic (congestive) heart failure: Secondary | ICD-10-CM | POA: Diagnosis present

## 2019-06-07 DIAGNOSIS — R0789 Other chest pain: Secondary | ICD-10-CM | POA: Diagnosis not present

## 2019-06-07 DIAGNOSIS — G4700 Insomnia, unspecified: Secondary | ICD-10-CM | POA: Diagnosis present

## 2019-06-07 DIAGNOSIS — I5033 Acute on chronic diastolic (congestive) heart failure: Secondary | ICD-10-CM | POA: Diagnosis present

## 2019-06-07 DIAGNOSIS — J45909 Unspecified asthma, uncomplicated: Secondary | ICD-10-CM | POA: Diagnosis present

## 2019-06-07 DIAGNOSIS — E782 Mixed hyperlipidemia: Secondary | ICD-10-CM | POA: Diagnosis present

## 2019-06-07 DIAGNOSIS — R9431 Abnormal electrocardiogram [ECG] [EKG]: Secondary | ICD-10-CM | POA: Diagnosis not present

## 2019-06-07 DIAGNOSIS — Z6841 Body Mass Index (BMI) 40.0 and over, adult: Secondary | ICD-10-CM | POA: Diagnosis not present

## 2019-06-07 DIAGNOSIS — R0602 Shortness of breath: Secondary | ICD-10-CM | POA: Diagnosis not present

## 2019-06-07 DIAGNOSIS — Z6839 Body mass index (BMI) 39.0-39.9, adult: Secondary | ICD-10-CM

## 2019-06-07 DIAGNOSIS — D696 Thrombocytopenia, unspecified: Secondary | ICD-10-CM | POA: Diagnosis not present

## 2019-06-07 DIAGNOSIS — Z8249 Family history of ischemic heart disease and other diseases of the circulatory system: Secondary | ICD-10-CM

## 2019-06-07 DIAGNOSIS — M549 Dorsalgia, unspecified: Secondary | ICD-10-CM | POA: Diagnosis present

## 2019-06-07 DIAGNOSIS — Z79899 Other long term (current) drug therapy: Secondary | ICD-10-CM

## 2019-06-07 DIAGNOSIS — K760 Fatty (change of) liver, not elsewhere classified: Secondary | ICD-10-CM | POA: Diagnosis present

## 2019-06-07 DIAGNOSIS — I82432 Acute embolism and thrombosis of left popliteal vein: Secondary | ICD-10-CM | POA: Diagnosis present

## 2019-06-07 DIAGNOSIS — K219 Gastro-esophageal reflux disease without esophagitis: Secondary | ICD-10-CM | POA: Diagnosis present

## 2019-06-07 DIAGNOSIS — K7460 Unspecified cirrhosis of liver: Secondary | ICD-10-CM | POA: Diagnosis not present

## 2019-06-07 DIAGNOSIS — G894 Chronic pain syndrome: Secondary | ICD-10-CM | POA: Diagnosis present

## 2019-06-07 DIAGNOSIS — E785 Hyperlipidemia, unspecified: Secondary | ICD-10-CM | POA: Diagnosis present

## 2019-06-07 MED ORDER — FUROSEMIDE 40 MG PO TABS: ORAL_TABLET | ORAL | 3 refills | Status: DC

## 2019-06-07 MED ORDER — METOLAZONE 5 MG PO TABS: 5.0000 mg | ORAL_TABLET | Freq: Every day | ORAL | 3 refills | Status: DC

## 2019-06-07 MED ORDER — SODIUM CHLORIDE 0.9% FLUSH: 3.0000 mL | Freq: Once | INTRAVENOUS | Status: DC

## 2019-06-07 NOTE — Telephone Encounter (Signed)
I spoke with patient and gave him information from Dr Harriet Masson.  He is unable to come in for an office visit in 2 weeks so will plan on keeping appointment already scheduled for March 16. Weight today is 387 lbs

## 2019-06-07 NOTE — Addendum Note (Signed)
Addended by: Thompson Grayer on: 06/07/2019 11:56 AM   Modules accepted: Orders

## 2019-06-07 NOTE — ED Triage Notes (Signed)
  Patient arrived with EMS from home reports central chest pain with SOB onset this morning , denies emesis or diaphoresis , no cough or fever. He received ASA 324 mg prior to arrival by EMS .

## 2019-06-07 NOTE — Telephone Encounter (Signed)
Left message to call office

## 2019-06-08 ENCOUNTER — Observation Stay (HOSPITAL_COMMUNITY): Payer: Medicare Other

## 2019-06-08 ENCOUNTER — Encounter (HOSPITAL_COMMUNITY): Payer: Self-pay | Admitting: Internal Medicine

## 2019-06-08 ENCOUNTER — Emergency Department (HOSPITAL_COMMUNITY): Payer: Medicare Other

## 2019-06-08 ENCOUNTER — Observation Stay (HOSPITAL_BASED_OUTPATIENT_CLINIC_OR_DEPARTMENT_OTHER): Payer: Medicare Other

## 2019-06-08 DIAGNOSIS — D649 Anemia, unspecified: Secondary | ICD-10-CM | POA: Diagnosis present

## 2019-06-08 DIAGNOSIS — D696 Thrombocytopenia, unspecified: Secondary | ICD-10-CM | POA: Diagnosis present

## 2019-06-08 DIAGNOSIS — E782 Mixed hyperlipidemia: Secondary | ICD-10-CM | POA: Diagnosis not present

## 2019-06-08 DIAGNOSIS — I5033 Acute on chronic diastolic (congestive) heart failure: Secondary | ICD-10-CM | POA: Diagnosis present

## 2019-06-08 DIAGNOSIS — I2609 Other pulmonary embolism with acute cor pulmonale: Secondary | ICD-10-CM | POA: Diagnosis not present

## 2019-06-08 DIAGNOSIS — K7460 Unspecified cirrhosis of liver: Secondary | ICD-10-CM | POA: Diagnosis present

## 2019-06-08 DIAGNOSIS — R079 Chest pain, unspecified: Secondary | ICD-10-CM | POA: Diagnosis not present

## 2019-06-08 DIAGNOSIS — I110 Hypertensive heart disease with heart failure: Secondary | ICD-10-CM | POA: Diagnosis present

## 2019-06-08 DIAGNOSIS — M549 Dorsalgia, unspecified: Secondary | ICD-10-CM | POA: Diagnosis present

## 2019-06-08 DIAGNOSIS — I2699 Other pulmonary embolism without acute cor pulmonale: Secondary | ICD-10-CM

## 2019-06-08 DIAGNOSIS — Z6841 Body Mass Index (BMI) 40.0 and over, adult: Secondary | ICD-10-CM | POA: Diagnosis not present

## 2019-06-08 DIAGNOSIS — I2693 Single subsegmental pulmonary embolism without acute cor pulmonale: Secondary | ICD-10-CM | POA: Diagnosis not present

## 2019-06-08 DIAGNOSIS — J45909 Unspecified asthma, uncomplicated: Secondary | ICD-10-CM | POA: Diagnosis present

## 2019-06-08 DIAGNOSIS — G894 Chronic pain syndrome: Secondary | ICD-10-CM

## 2019-06-08 DIAGNOSIS — Z20822 Contact with and (suspected) exposure to covid-19: Secondary | ICD-10-CM | POA: Diagnosis present

## 2019-06-08 DIAGNOSIS — R0602 Shortness of breath: Secondary | ICD-10-CM | POA: Diagnosis not present

## 2019-06-08 DIAGNOSIS — G4700 Insomnia, unspecified: Secondary | ICD-10-CM | POA: Diagnosis present

## 2019-06-08 DIAGNOSIS — I5032 Chronic diastolic (congestive) heart failure: Secondary | ICD-10-CM

## 2019-06-08 DIAGNOSIS — E785 Hyperlipidemia, unspecified: Secondary | ICD-10-CM | POA: Diagnosis present

## 2019-06-08 DIAGNOSIS — D509 Iron deficiency anemia, unspecified: Secondary | ICD-10-CM | POA: Diagnosis present

## 2019-06-08 DIAGNOSIS — I10 Essential (primary) hypertension: Secondary | ICD-10-CM | POA: Diagnosis not present

## 2019-06-08 DIAGNOSIS — I82432 Acute embolism and thrombosis of left popliteal vein: Secondary | ICD-10-CM | POA: Diagnosis present

## 2019-06-08 DIAGNOSIS — K219 Gastro-esophageal reflux disease without esophagitis: Secondary | ICD-10-CM | POA: Diagnosis present

## 2019-06-08 DIAGNOSIS — J9601 Acute respiratory failure with hypoxia: Secondary | ICD-10-CM | POA: Diagnosis present

## 2019-06-08 DIAGNOSIS — Z8249 Family history of ischemic heart disease and other diseases of the circulatory system: Secondary | ICD-10-CM | POA: Diagnosis not present

## 2019-06-08 DIAGNOSIS — K760 Fatty (change of) liver, not elsewhere classified: Secondary | ICD-10-CM | POA: Diagnosis present

## 2019-06-08 DIAGNOSIS — Z79899 Other long term (current) drug therapy: Secondary | ICD-10-CM | POA: Diagnosis not present

## 2019-06-08 HISTORY — DX: Anemia, unspecified: D64.9

## 2019-06-08 HISTORY — DX: Other pulmonary embolism without acute cor pulmonale: I26.99

## 2019-06-08 LAB — CBC
HCT: 35 % — ABNORMAL LOW (ref 39.0–52.0)
HCT: 35.3 % — ABNORMAL LOW (ref 39.0–52.0)
Hemoglobin: 10.6 g/dL — ABNORMAL LOW (ref 13.0–17.0)
Hemoglobin: 10.7 g/dL — ABNORMAL LOW (ref 13.0–17.0)
MCH: 24.5 pg — ABNORMAL LOW (ref 26.0–34.0)
MCH: 24.6 pg — ABNORMAL LOW (ref 26.0–34.0)
MCHC: 30.3 g/dL (ref 30.0–36.0)
MCHC: 30.3 g/dL (ref 30.0–36.0)
MCV: 81 fL (ref 80.0–100.0)
MCV: 81.2 fL (ref 80.0–100.0)
Platelets: 133 10*3/uL — ABNORMAL LOW (ref 150–400)
Platelets: 165 10*3/uL (ref 150–400)
RBC: 4.31 MIL/uL (ref 4.22–5.81)
RBC: 4.36 MIL/uL (ref 4.22–5.81)
RDW: 16.5 % — ABNORMAL HIGH (ref 11.5–15.5)
RDW: 16.6 % — ABNORMAL HIGH (ref 11.5–15.5)
WBC: 10 10*3/uL (ref 4.0–10.5)
WBC: 10 10*3/uL (ref 4.0–10.5)
nRBC: 0 % (ref 0.0–0.2)
nRBC: 0 % (ref 0.0–0.2)

## 2019-06-08 LAB — PROTIME-INR
INR: 1.1 (ref 0.8–1.2)
Prothrombin Time: 13.8 seconds (ref 11.4–15.2)

## 2019-06-08 LAB — BASIC METABOLIC PANEL
Anion gap: 11 (ref 5–15)
BUN: 12 mg/dL (ref 8–23)
CO2: 24 mmol/L (ref 22–32)
Calcium: 9 mg/dL (ref 8.9–10.3)
Chloride: 103 mmol/L (ref 98–111)
Creatinine, Ser: 1.15 mg/dL (ref 0.61–1.24)
GFR calc Af Amer: >60 mL/min (ref >60)
GFR calc non Af Amer: >60 mL/min (ref >60)
Glucose, Bld: 106 mg/dL — ABNORMAL HIGH (ref 70–99)
Potassium: 4 mmol/L (ref 3.5–5.1)
Sodium: 138 mmol/L (ref 135–145)

## 2019-06-08 LAB — ECHOCARDIOGRAM LIMITED
Height: 82 in
Weight: 6192 oz

## 2019-06-08 LAB — BRAIN NATRIURETIC PEPTIDE: B Natriuretic Peptide: 60.6 pg/mL (ref 0.0–100.0)

## 2019-06-08 LAB — HEPARIN LEVEL (UNFRACTIONATED)
Heparin Unfractionated: 0.3 IU/mL (ref 0.30–0.70)
Heparin Unfractionated: 0.3 IU/mL (ref 0.30–0.70)

## 2019-06-08 LAB — TYPE AND SCREEN
ABO/RH(D): O POS
Antibody Screen: NEGATIVE

## 2019-06-08 LAB — TROPONIN I (HIGH SENSITIVITY)
Troponin I (High Sensitivity): 3 ng/L (ref <18)
Troponin I (High Sensitivity): 3 ng/L (ref <18)

## 2019-06-08 LAB — MAGNESIUM: Magnesium: 1.9 mg/dL (ref 1.7–2.4)

## 2019-06-08 LAB — SARS CORONAVIRUS 2 (TAT 6-24 HRS): SARS Coronavirus 2: NEGATIVE

## 2019-06-08 MED ORDER — FUROSEMIDE 40 MG PO TABS
40.0000 mg | ORAL_TABLET | Freq: Every day | ORAL | Status: DC
  Administered 2019-06-08 – 2019-06-11 (×4): 40 mg via ORAL
  Filled 2019-06-08 (×4): qty 1

## 2019-06-08 MED ORDER — PANTOPRAZOLE SODIUM 40 MG PO TBEC
40.0000 mg | DELAYED_RELEASE_TABLET | Freq: Two times a day (BID) | ORAL | Status: DC
  Administered 2019-06-08 – 2019-06-13 (×11): 40 mg via ORAL
  Filled 2019-06-08 (×11): qty 1

## 2019-06-08 MED ORDER — LISINOPRIL 5 MG PO TABS
15.0000 mg | ORAL_TABLET | Freq: Every day | ORAL | Status: DC
  Administered 2019-06-09 – 2019-06-13 (×5): 15 mg via ORAL
  Filled 2019-06-08 (×6): qty 1

## 2019-06-08 MED ORDER — PERFLUTREN LIPID MICROSPHERE
1.0000 mL | INTRAVENOUS | Status: AC | PRN
  Administered 2019-06-08: 2 mL via INTRAVENOUS
  Filled 2019-06-08: qty 10

## 2019-06-08 MED ORDER — ZOLPIDEM TARTRATE 5 MG PO TABS
10.0000 mg | ORAL_TABLET | Freq: Every evening | ORAL | Status: DC | PRN
  Administered 2019-06-08 – 2019-06-12 (×5): 10 mg via ORAL
  Filled 2019-06-08 (×5): qty 2

## 2019-06-08 MED ORDER — ONDANSETRON HCL 4 MG/2ML IJ SOLN
4.0000 mg | Freq: Four times a day (QID) | INTRAMUSCULAR | Status: DC | PRN
  Administered 2019-06-08 – 2019-06-11 (×2): 4 mg via INTRAVENOUS
  Filled 2019-06-08 (×2): qty 2

## 2019-06-08 MED ORDER — HEPARIN BOLUS VIA INFUSION
7000.0000 [IU] | Freq: Once | INTRAVENOUS | Status: AC
  Administered 2019-06-08: 7000 [IU] via INTRAVENOUS
  Filled 2019-06-08: qty 7000

## 2019-06-08 MED ORDER — FUROSEMIDE 20 MG PO TABS
20.0000 mg | ORAL_TABLET | Freq: Every day | ORAL | Status: DC
  Administered 2019-06-08 – 2019-06-10 (×3): 20 mg via ORAL
  Filled 2019-06-08 (×3): qty 1

## 2019-06-08 MED ORDER — ACETAMINOPHEN 325 MG PO TABS
650.0000 mg | ORAL_TABLET | Freq: Four times a day (QID) | ORAL | Status: DC | PRN
  Administered 2019-06-08 – 2019-06-11 (×7): 650 mg via ORAL
  Filled 2019-06-08 (×7): qty 2

## 2019-06-08 MED ORDER — ACETAMINOPHEN 650 MG RE SUPP: 650.0000 mg | Freq: Four times a day (QID) | RECTAL | Status: DC | PRN

## 2019-06-08 MED ORDER — BUPRENORPHINE HCL 8 MG SL SUBL: 8.0000 mg | SUBLINGUAL_TABLET | Freq: Every day | SUBLINGUAL | Status: DC

## 2019-06-08 MED ORDER — POLYETHYLENE GLYCOL 3350 17 G PO PACK
17.0000 g | PACK | Freq: Every day | ORAL | Status: DC | PRN
  Administered 2019-06-10 – 2019-06-13 (×4): 17 g via ORAL
  Filled 2019-06-08 (×4): qty 1

## 2019-06-08 MED ORDER — POTASSIUM CHLORIDE CRYS ER 10 MEQ PO TBCR
20.0000 meq | EXTENDED_RELEASE_TABLET | Freq: Two times a day (BID) | ORAL | Status: DC
  Administered 2019-06-08 – 2019-06-10 (×5): 20 meq via ORAL
  Filled 2019-06-08 (×5): qty 2

## 2019-06-08 MED ORDER — ALBUTEROL SULFATE (2.5 MG/3ML) 0.083% IN NEBU: 2.5000 mg | INHALATION_SOLUTION | Freq: Four times a day (QID) | RESPIRATORY_TRACT | Status: DC | PRN

## 2019-06-08 MED ORDER — METOLAZONE 5 MG PO TABS
5.0000 mg | ORAL_TABLET | Freq: Every day | ORAL | Status: DC
  Administered 2019-06-08 – 2019-06-11 (×4): 5 mg via ORAL
  Filled 2019-06-08 (×5): qty 1

## 2019-06-08 MED ORDER — HEPARIN (PORCINE) 25000 UT/250ML-% IV SOLN
2550.0000 [IU]/h | INTRAVENOUS | Status: DC
  Administered 2019-06-08: 2550 [IU]/h via INTRAVENOUS
  Administered 2019-06-08: 2250 [IU]/h via INTRAVENOUS
  Administered 2019-06-08: 2400 [IU]/h via INTRAVENOUS
  Administered 2019-06-09 – 2019-06-10 (×3): 2550 [IU]/h via INTRAVENOUS
  Filled 2019-06-08 (×6): qty 250

## 2019-06-08 MED ORDER — ONDANSETRON HCL 4 MG PO TABS: 4.0000 mg | ORAL_TABLET | Freq: Four times a day (QID) | ORAL | Status: DC | PRN

## 2019-06-08 MED ORDER — GABAPENTIN 400 MG PO CAPS
800.0000 mg | ORAL_CAPSULE | Freq: Three times a day (TID) | ORAL | Status: DC
  Administered 2019-06-08 – 2019-06-13 (×16): 800 mg via ORAL
  Filled 2019-06-08 (×18): qty 2

## 2019-06-08 MED ORDER — BUPRENORPHINE HCL 8 MG SL SUBL
8.0000 mg | SUBLINGUAL_TABLET | Freq: Two times a day (BID) | SUBLINGUAL | Status: DC
  Administered 2019-06-08 – 2019-06-13 (×11): 8 mg via SUBLINGUAL
  Filled 2019-06-08 (×11): qty 1

## 2019-06-08 MED ORDER — IOHEXOL 350 MG/ML SOLN
75.0000 mL | Freq: Once | INTRAVENOUS | Status: AC | PRN
  Administered 2019-06-08: 75 mL via INTRAVENOUS

## 2019-06-08 MED ORDER — PRAVASTATIN SODIUM 10 MG PO TABS
20.0000 mg | ORAL_TABLET | Freq: Every day | ORAL | Status: DC
  Administered 2019-06-08 – 2019-06-13 (×6): 20 mg via ORAL
  Filled 2019-06-08 (×6): qty 2

## 2019-06-08 NOTE — Progress Notes (Signed)
Bilateral lower extremity venous duplex exam completed.  Preliminary results given to [place], [place].  Preliminary results can be found under CV proc under chart review.  06/08/2019 12:47 PM  Rhen Kawecki, K., RDMS, RVT

## 2019-06-08 NOTE — Telephone Encounter (Signed)
Ok thank you 

## 2019-06-08 NOTE — Progress Notes (Signed)
ANTICOAGULATION CONSULT NOTE - Initial Consult  Pharmacy Consult for Heparin Indication: pulmonary embolus  Allergies  Allergen Reactions  . Penicillins     Did it involve swelling of the face/tongue/throat, SOB, or low BP? N/A Did it involve sudden or severe rash/hives, skin peeling, or any reaction on the inside of your mouth or nose? N/A Did you need to seek medical attention at a hospital or doctor's office?N/A When did it last happen?N/A If all above answers are "NO", may proceed with cephalosporin use.    Patient Measurements: Height: 6' 10"  (208.3 cm) Weight: (!) 385 lb 12.9 oz (175 kg) IBW/kg (Calculated) : 100.6 Heparin Dosing Weight: 140 kg  Vital Signs: Temp: 99.9 F (37.7 C) (02/24 2349) Temp Source: Oral (02/24 2349) BP: 125/74 (02/25 0330) Pulse Rate: 85 (02/25 0330)  Labs: Recent Labs    06/07/19 2354 06/08/19 0158  HGB 10.7*  --   HCT 35.3*  --   PLT 133*  --   LABPROT 13.8  --   INR 1.1  --   CREATININE 1.15  --   TROPONINIHS 3 3    Estimated Creatinine Clearance: 121.3 mL/min (by C-G formula based on SCr of 1.15 mg/dL).   Medical History: Past Medical History:  Diagnosis Date  . Fatty liver disease, nonalcoholic   . Hyperlipidemia 01/24/2019  . Hypertension 01/24/2019  . Insomnia 01/24/2019  . Lumbar disc herniation 01/24/2019    Medications:  No current facility-administered medications on file prior to encounter.   Current Outpatient Medications on File Prior to Encounter  Medication Sig Dispense Refill  . acetaminophen (TYLENOL) 325 MG tablet Take 2 tablets (650 mg total) by mouth every 4 (four) hours as needed for fever or mild pain.    . Buprenorphine HCl-Naloxone HCl 8-2 MG FILM Place 1 tablet under the tongue in the morning and at bedtime.     . furosemide (LASIX) 40 MG tablet Take 2 tablets by mouth every morning and one tablet by mouth every afternoon (Patient taking differently: Take 20-40 mg by mouth See admin instructions.  Take 2 tablets by mouth every morning and one tablet by mouth every afternoon) 90 tablet 3  . gabapentin (NEURONTIN) 800 MG tablet Take 1 tablet (800 mg total) by mouth 3 (three) times daily. 90 tablet 1  . lisinopril (ZESTRIL) 5 MG tablet Take 3 tablets (15 mg total) by mouth daily. 30 tablet 1  . pantoprazole (PROTONIX) 40 MG tablet Take 40 mg by mouth 2 (two) times daily.    . polyethylene glycol (MIRALAX / GLYCOLAX) 17 g packet Take 17 g by mouth daily. (Patient taking differently: Take 17 g by mouth daily as needed for moderate constipation. ) 14 each 0  . potassium chloride (KLOR-CON) 10 MEQ tablet Take 2 tablets (20 mEq total) by mouth 2 (two) times daily. 120 tablet 1  . pravastatin (PRAVACHOL) 20 MG tablet Take 1 tablet (20 mg total) by mouth daily. 30 tablet 1  . VENTOLIN HFA 108 (90 Base) MCG/ACT inhaler Inhale 1 puff into the lungs every 4 (four) hours as needed for shortness of breath. 6.7 g 1  . zolpidem (AMBIEN) 10 MG tablet Take 10 mg by mouth at bedtime as needed for sleep.     . metolazone (ZAROXOLYN) 5 MG tablet Take 1 tablet (5 mg total) by mouth daily. Take 30 minutes prior to AM furosemide dose 30 tablet 3  . [DISCONTINUED] pantoprazole (PROTONIX) 40 MG tablet Take 1 tablet (40 mg total) by mouth 2 (  two) times daily. 60 tablet 0  . [DISCONTINUED] pregabalin (LYRICA) 25 MG capsule Take 1 capsule (25 mg total) by mouth 2 (two) times daily. 60 capsule 1     Assessment: 64 y.o. male with PE for heparin Goal of Therapy:  Heparin level 0.3-0.7 units/ml Monitor platelets by anticoagulation protocol: Yes   Plan:  Heparin 7000 units IV bolus, then start heparin 2250 units/hr Check heparin level in 6 hours.    Chauntelle Azpeitia, Bronson Curb 06/08/2019,5:17 AM

## 2019-06-08 NOTE — Progress Notes (Signed)
ANTICOAGULATION CONSULT NOTE - Follow Up Consult  Pharmacy Consult for Heparin Indication: pulmonary embolus  Allergies  Allergen Reactions  . Penicillins     Did it involve swelling of the face/tongue/throat, SOB, or low BP? N/A Did it involve sudden or severe rash/hives, skin peeling, or any reaction on the inside of your mouth or nose? N/A Did you need to seek medical attention at a hospital or doctor's office?N/A When did it last happen?N/A If all above answers are "NO", may proceed with cephalosporin use.    Patient Measurements: Height: 6' 10"  (208.3 cm) Weight: (!) 387 lb (175.5 kg) IBW/kg (Calculated) : 100.6 Heparin Dosing Weight:  140.7 kg  Vital Signs: Temp: 98.5 F (36.9 C) (02/25 1147) Temp Source: Oral (02/25 1147) BP: 98/57 (02/25 1218) Pulse Rate: 81 (02/25 1218)  Labs: Recent Labs    06/07/19 2354 06/08/19 0158 06/08/19 0934 06/08/19 1200  HGB 10.7*  --  10.6*  --   HCT 35.3*  --  35.0*  --   PLT 133*  --  165  --   LABPROT 13.8  --   --   --   INR 1.1  --   --   --   HEPARINUNFRC  --   --   --  0.30  CREATININE 1.15  --   --   --   TROPONINIHS 3 3  --   --     Estimated Creatinine Clearance: 121.5 mL/min (by C-G formula based on SCr of 1.15 mg/dL).  Assessment: Anticoag: Heparin for PE and DVT, no HS - Hgb 10.7, plts 133, no AC PTA - 2/25: Doppler: acute deep vein thrombosis involving the left popliteal vein. - HL 0.3 low end of goal.  Goal of Therapy:  Heparin level 0.3-0.7 units/ml Monitor platelets by anticoagulation protocol: Yes   Plan:  Increase heparin slightly to 2400 so level doesn't fall below goal. Confirm level in 6 hrs. Daily HL and CBC   Harlin Mazzoni S. Alford Highland, PharmD, BCPS Clinical Staff Pharmacist [email or web address] Alford Highland, [employer] 06/08/2019,12:52 PM

## 2019-06-08 NOTE — Progress Notes (Signed)
ANTICOAGULATION CONSULT NOTE - Follow Up Consult  Pharmacy Consult for Heparin Indication: pulmonary embolus  Allergies  Allergen Reactions  . Penicillins     Did it involve swelling of the face/tongue/throat, SOB, or low BP? N/A Did it involve sudden or severe rash/hives, skin peeling, or any reaction on the inside of your mouth or nose? N/A Did you need to seek medical attention at a hospital or doctor's office?N/A When did it last happen?N/A If all above answers are "NO", may proceed with cephalosporin use.    Patient Measurements: Height: 6' 10"  (208.3 cm) Weight: (!) 387 lb (175.5 kg) IBW/kg (Calculated) : 100.6 Heparin Dosing Weight: 140.7 kg  Vital Signs: Temp: 99.8 F (37.7 C) (02/25 1839) Temp Source: Oral (02/25 1839) BP: 102/60 (02/25 1839) Pulse Rate: 80 (02/25 1839)  Labs: Recent Labs    06/07/19 2354 06/08/19 0158 06/08/19 0934 06/08/19 1200 06/08/19 1930  HGB 10.7*  --  10.6*  --   --   HCT 35.3*  --  35.0*  --   --   PLT 133*  --  165  --   --   LABPROT 13.8  --   --   --   --   INR 1.1  --   --   --   --   HEPARINUNFRC  --   --   --  0.30 0.30  CREATININE 1.15  --   --   --   --   TROPONINIHS 3 3  --   --   --     Estimated Creatinine Clearance: 121.5 mL/min (by C-G formula based on SCr of 1.15 mg/dL).  Assessment: 86 yom continuing on heparin for acute PE with no RHS and LLE DVT. Patient is not on anticoagulation PTA. Hg low but stable, plt trended up to 165. Confirmatory heparin level remains therapeutic but at the lower end of goal at 0.3 despite rate increase earlier today - will increase again in patient with acute VTE to ensure level does not fall below goal. No bleeding or issues with infusion per discussion with RN.  Goal of Therapy:  Heparin level 0.3-0.7 units/ml Monitor platelets by anticoagulation protocol: Yes   Plan:  Increase heparin slightly to 2550 to ensure level does not fall below goal Monitor daily heparin level and CBC,  s/sx bleeding   Elicia Lamp, PharmD, BCPS Please check AMION for all [place] contact numbers Clinical Pharmacist 06/08/2019 8:17 PM

## 2019-06-08 NOTE — ED Provider Notes (Signed)
[facility] EMERGENCY DEPARTMENT Provider Note   CSN: [id number] Arrival date & time: 06/07/19  2340     History Chief Complaint  Patient presents with  . Chest Pain    Charles Gray is a 64 y.o. male.  Patient is a 64 year old male with a past medical history of morbid obesity, CHF, hypertension, cirrhosis presenting with complaints of chest pain.  This began at approximately noon today.  He describes sharp pain to the front of his chest and to the right lateral ribs.  This is worse when he tries to breathe.  He feels short of breath.  He denies fevers, chills, or cough.  He does report some swelling in his legs.  The history is provided by the patient.  Chest Pain Pain location:  Substernal area Pain quality: sharp   Pain radiates to:  Does not radiate Pain severity:  Moderate Onset quality:  Sudden Duration:  12 hours Timing:  Constant Progression:  Worsening Chronicity:  New Context: breathing   Relieved by:  Nothing Worsened by:  Deep breathing and movement      Past Medical History:  Diagnosis Date  . Fatty liver disease, nonalcoholic   . Hyperlipidemia 01/24/2019  . Hypertension 01/24/2019  . Insomnia 01/24/2019  . Lumbar disc herniation 01/24/2019    Patient Active Problem List   Diagnosis Date Noted  . Chronic heart failure with preserved ejection fraction ([place]) 05/30/2019  . Morbid obesity with BMI of 40.0-44.9, adult ([place]) 05/30/2019  . Hyponatremia   . Benign essential HTN   . Drug induced constipation   . Peripheral edema   . Hypotension due to drugs   . Hypoalbuminemia due to protein-calorie malnutrition ([place])   . Hypokalemia   . Thrombocytopenia ([place])   . Anemia of chronic disease   . Cirrhosis of liver without ascites ([place])   . Morbid obesity ([place])   . Chronic pain syndrome   . Debility 03/13/2019  . Acute respiratory failure with hypoxia ([place])   . Shock ([place])   . Dyspnea   . GIB (gastrointestinal bleeding)  03/04/2019  . Hyperlipidemia 01/24/2019  . Hypertension 01/24/2019  . Lumbar disc herniation 01/24/2019  . Insomnia 01/24/2019    Past Surgical History:  Procedure Laterality Date  . ESOPHAGOGASTRODUODENOSCOPY (EGD) WITH PROPOFOL N/A 03/05/2019   Procedure: ESOPHAGOGASTRODUODENOSCOPY (EGD) WITH PROPOFOL;  Surgeon: Doran Stabler, MD;  Location: [facility];  Service: Gastroenterology;  Laterality: N/A;  Large Blood Clot Removed  . ESOPHAGOGASTRODUODENOSCOPY (EGD) WITH PROPOFOL N/A 03/06/2019   Procedure: ESOPHAGOGASTRODUODENOSCOPY (EGD) WITH PROPOFOL;  Surgeon: Thornton Park, MD;  Location: [facility];  Service: Gastroenterology;  Laterality: N/A;  . HEMORROIDECTOMY         Family History  Problem Relation Age of Onset  . Hyperlipidemia Mother   . Hypertension Mother   . Stroke Mother   . CAD Maternal Grandmother   . CAD Maternal Grandfather     Social History   Tobacco Use  . Smoking status: Never Smoker  . Smokeless tobacco: Never Used  Substance Use Topics  . Alcohol use: Never  . Drug use: Not Currently    Types: Marijuana    Comment: Smoked for 30 years    Home Medications Prior to Admission medications   Medication Sig Start Date End Date Taking? Authorizing Provider  acetaminophen (TYLENOL) 325 MG tablet Take 2 tablets (650 mg total) by mouth every 4 (four) hours as needed for fever or mild pain. 03/23/19   [place], Lavon Paganini,  PA-C  buprenorphine-naloxone (SUBOXONE) 8-2 mg SUBL SL tablet Place 2 tablets under the tongue daily.     [provider]  furosemide (LASIX) 40 MG tablet Take 2 tablets by mouth every morning and one tablet by mouth every afternoon 06/07/19   Tobb, Kardie, DO  gabapentin (NEURONTIN) 800 MG tablet Take 1 tablet (800 mg total) by mouth 3 (three) times daily. 03/23/19   Angiulli, Lavon Paganini, PA-C  lisinopril (ZESTRIL) 5 MG tablet Take 3 tablets (15 mg total) by mouth daily. 03/23/19   Angiulli, Lavon Paganini, PA-C  metolazone  (ZAROXOLYN) 5 MG tablet Take 1 tablet (5 mg total) by mouth daily. Take 30 minutes prior to AM furosemide dose 06/07/19   Tobb, Kardie, DO  pantoprazole (PROTONIX) 40 MG tablet Take 1 tablet (40 mg total) by mouth 2 (two) times daily. 03/23/19 04/22/19  Angiulli, Lavon Paganini, PA-C  polyethylene glycol (MIRALAX / GLYCOLAX) 17 g packet Take 17 g by mouth daily. 03/23/19   Angiulli, Lavon Paganini, PA-C  potassium chloride (KLOR-CON) 10 MEQ tablet Take 2 tablets (20 mEq total) by mouth 2 (two) times daily. 05/30/19   Tobb, Kardie, DO  pravastatin (PRAVACHOL) 20 MG tablet Take 1 tablet (20 mg total) by mouth daily. 03/23/19   Angiulli, Lavon Paganini, PA-C  pregabalin (LYRICA) 25 MG capsule Take 1 capsule (25 mg total) by mouth 2 (two) times daily. 03/23/19   Angiulli, Lavon Paganini, PA-C  VENTOLIN HFA 108 (90 Base) MCG/ACT inhaler Inhale 1 puff into the lungs every 4 (four) hours as needed for shortness of breath. 03/23/19   Angiulli, Lavon Paganini, PA-C  zolpidem (AMBIEN) 10 MG tablet Take 10 mg by mouth at bedtime as needed. 05/19/19   [provider]    Allergies    Penicillins  Review of Systems   Review of Systems  Cardiovascular: Positive for chest pain.  All other systems reviewed and are negative.   Physical Exam Updated Vital Signs BP 136/73 (BP Location: Right Arm)   Pulse 92   Temp 99.9 F (37.7 C) (Oral)   Resp (!) 24   Ht 6' 10"  (2.083 m)   Wt (!) 175 kg   SpO2 98%   BMI 40.34 kg/m   Physical Exam Vitals and nursing note reviewed.  Constitutional:      General: He is not in acute distress.    Appearance: He is well-developed. He is not diaphoretic.  HENT:     Head: Normocephalic and atraumatic.  Cardiovascular:     Rate and Rhythm: Normal rate and regular rhythm.     Heart sounds: No murmur. No friction rub.  Pulmonary:     Effort: Pulmonary effort is normal. No respiratory distress.     Breath sounds: Normal breath sounds. No wheezing or rales.  Abdominal:     General: Bowel sounds  are normal. There is no distension.     Palpations: Abdomen is soft.     Tenderness: There is no abdominal tenderness.  Musculoskeletal:        General: Normal range of motion.     Cervical back: Normal range of motion and neck supple.     Right lower leg: No tenderness. Edema present.     Left lower leg: No tenderness. Edema present.     Comments: Patient with chronic edema and venous stasis of the lower extremities.  Skin:    General: Skin is warm and dry.  Neurological:     Mental Status: He is alert and oriented to  person, place, and time.     Coordination: Coordination normal.     ED Results / Procedures / Treatments   Labs (all labs ordered are listed, but only abnormal results are displayed) Labs Reviewed  BASIC METABOLIC PANEL - Abnormal; Notable for the following components:      Result Value   Glucose, Bld 106 (*)    All other components within normal limits  CBC - Abnormal; Notable for the following components:   Hemoglobin 10.7 (*)    HCT 35.3 (*)    MCH 24.5 (*)    RDW 16.5 (*)    Platelets 133 (*)    All other components within normal limits  PROTIME-INR  TROPONIN I (HIGH SENSITIVITY)  TROPONIN I (HIGH SENSITIVITY)    EKG None  Radiology DG Chest 2 View  Result Date: 06/08/2019 CLINICAL DATA:  Central chest pain since this morning, hypertension EXAM: CHEST - 2 VIEW COMPARISON:  03/05/2019 FINDINGS: Frontal and lateral views of the chest demonstrate a stable cardiac silhouette. Chronic bibasilar scarring without airspace disease, effusion, or pneumothorax. No acute bony abnormalities. IMPRESSION: 1. No acute intrathoracic process. Electronically Signed   By: Randa Ngo M.D.   On: 06/08/2019 00:27    Procedures Procedures (including critical care time)  Medications Ordered in ED Medications  sodium chloride flush (NS) 0.9 % injection 3 mL (3 mLs Intravenous Not Given 06/08/19 0325)    ED Course  I have reviewed the triage vital signs and the  nursing notes.  Pertinent labs & imaging results that were available during my care of the patient were reviewed by me and considered in my medical decision making (see chart for details).    MDM Rules/Calculators/A&P  Patient presenting here with sudden onset of chest pain that is pleuritic in nature.  He is also short of breath with ambulation.  Patient's work-up shows no evidence of a cardiac etiology, however his CT scan does show an acute pulmonary embolism.  There is no evidence for heart strain and his vitals are otherwise stable.  Patient started on heparin and will be admitted to the hospitalist service under the care of Dr. Hal Hope.  CRITICAL CARE Performed by: Veryl Speak Total critical care time: 35 minutes Critical care time was exclusive of separately billable procedures and treating other patients. Critical care was necessary to treat or prevent imminent or life-threatening deterioration. Critical care was time spent personally by me on the following activities: development of treatment plan with patient and/or surrogate as well as nursing, discussions with consultants, evaluation of patient's response to treatment, examination of patient, obtaining history from patient or surrogate, ordering and performing treatments and interventions, ordering and review of laboratory studies, ordering and review of radiographic studies, pulse oximetry and re-evaluation of patient's condition.   Final Clinical Impression(s) / ED Diagnoses Final diagnoses:  None    Rx / DC Orders ED Discharge Orders    None       Veryl Speak, MD 06/08/19 [phone]

## 2019-06-08 NOTE — Progress Notes (Signed)
  Echocardiogram 2D Echocardiogram has been performed.  Charles Gray 06/08/2019, 10:50 AM

## 2019-06-08 NOTE — H&P (Addendum)
History and Physical    Charles Gray RCB:[id number] DOB: [birth year] DOA: 06/07/2019   Referring MD/NP/PA: Gean Birchwood, MD PCP: Charles Skeens., MD  Patient coming from: Home  Chief Complaint: Chest pain  I have personally briefly reviewed patient's old medical records in [place]   HPI: Charles Gray is a 64 y.o. male with medical history significant of hypertension, hyperlipidemia,  CHF, asthma, nonalcoholic fatty liver disease with signs of cirrhosis, herniated lumbar disks with chronic back pain, GI bleed, obesity, and GERD.  He presents with complaints of right-sided chest pain that started yesterday morning.  Describes pain as sharp with radiation to the right side of his back.  Symptoms worsen with trying to take deep breath in or coughing.   Associated symptoms include bilateral leg swelling, shortness of breath, and some right upper quadrant abdominal discomfort.  Denies any recent fever, chills, nausea, vomiting, diarrhea, NSAID use, or blood in stools recently.  He does not drink alcohol or smoke.  At baseline patient ambulates with use of a walker and states that he has been more sedimentary than usual.  He is on Suboxone for previous history of chronic opioid use due to chronic pain.  He had just recently had a prolonged hospitalization in November 2020 with GI bleed requiring 3 units of PRBCs.  GI performed EGD which noted a small gastric ulcer, but source was not clearly determined.  ED Course: Upon admission into the emergency department patient was noted to be febrile up to 100.4 F, respirations 11-25, and all other vital signs maintained.  Labs significant for hemoglobin 10.7 and platelets 133.  CT angiogram of the chest noted an acute pulmonary embolus of the right lower lobe with no significant signs of right heart strain, small right pleural effusion, and signs of cirrhosis.  COVID-19 screening was still pending.  Patient was started on a heparin drip.  TRH  called to admit.  Review of Systems  Constitutional: Negative for fever and weight loss.  HENT: Negative for hearing loss and nosebleeds.   Eyes: Negative for photophobia and pain.  Respiratory: Positive for shortness of breath. Negative for wheezing.   Cardiovascular: Positive for chest pain and leg swelling.  Gastrointestinal: Positive for abdominal pain. Negative for blood in stool, diarrhea, nausea and vomiting.  Genitourinary: Negative for dysuria and hematuria.  Musculoskeletal: Positive for back pain and myalgias.  Skin: Negative for itching.  Neurological: Positive for sensory change. Negative for focal weakness and loss of consciousness.  Endo/Heme/Allergies: Negative for polydipsia.  Psychiatric/Behavioral: Negative for substance abuse. The patient has insomnia.     Past Medical History:  Diagnosis Date  . Fatty liver disease, nonalcoholic   . Hyperlipidemia 01/24/2019  . Hypertension 01/24/2019  . Insomnia 01/24/2019  . Lumbar disc herniation 01/24/2019    Past Surgical History:  Procedure Laterality Date  . ESOPHAGOGASTRODUODENOSCOPY (EGD) WITH PROPOFOL N/A 03/05/2019   Procedure: ESOPHAGOGASTRODUODENOSCOPY (EGD) WITH PROPOFOL;  Surgeon: Charles Stabler, MD;  Location: [facility];  Service: Gastroenterology;  Laterality: N/A;  Large Blood Clot Removed  . ESOPHAGOGASTRODUODENOSCOPY (EGD) WITH PROPOFOL N/A 03/06/2019   Procedure: ESOPHAGOGASTRODUODENOSCOPY (EGD) WITH PROPOFOL;  Surgeon: Charles Park, MD;  Location: [place];  Service: Gastroenterology;  Laterality: N/A;  . HEMORROIDECTOMY       reports that he has never smoked. He has never used smokeless tobacco. He reports previous drug use. Drug: Marijuana. He reports that he does not drink alcohol.  Allergies  Allergen Reactions  . Penicillins  Did it involve swelling of the face/tongue/throat, SOB, or low BP? N/A Did it involve sudden or severe rash/hives, skin peeling, or any reaction on the  inside of your mouth or nose? N/A Did you need to seek medical attention at a hospital or doctor's office?N/A When did it last happen?N/A If all above answers are "NO", may proceed with cephalosporin use.    Family History  Problem Relation Age of Onset  . Hyperlipidemia Mother   . Hypertension Mother   . Stroke Mother   . CAD Maternal Grandmother   . CAD Maternal Grandfather     Prior to Admission medications   Medication Sig Start Date End Date Taking? Authorizing Provider  acetaminophen (TYLENOL) 325 MG tablet Take 2 tablets (650 mg total) by mouth every 4 (four) hours as needed for fever or mild pain. 03/23/19  Yes Angiulli, Lavon Paganini, PA-C  Buprenorphine HCl-Naloxone HCl 8-2 MG FILM Place 1 tablet under the tongue in the morning and at bedtime.    Yes [provider]  furosemide (LASIX) 40 MG tablet Take 2 tablets by mouth every morning and one tablet by mouth every afternoon Patient taking differently: Take 20-40 mg by mouth See admin instructions. Take 2 tablets by mouth every morning and one tablet by mouth every afternoon 06/07/19  Yes Tobb, Kardie, DO  gabapentin (NEURONTIN) 800 MG tablet Take 1 tablet (800 mg total) by mouth 3 (three) times daily. 03/23/19  Yes Angiulli, Lavon Paganini, PA-C  lisinopril (ZESTRIL) 5 MG tablet Take 3 tablets (15 mg total) by mouth daily. 03/23/19  Yes Angiulli, Lavon Paganini, PA-C  pantoprazole (PROTONIX) 40 MG tablet Take 40 mg by mouth 2 (two) times daily.   Yes [provider]  polyethylene glycol (MIRALAX / GLYCOLAX) 17 g packet Take 17 g by mouth daily. Patient taking differently: Take 17 g by mouth daily as needed for moderate constipation.  03/23/19  Yes Angiulli, Lavon Paganini, PA-C  potassium chloride (KLOR-CON) 10 MEQ tablet Take 2 tablets (20 mEq total) by mouth 2 (two) times daily. 05/30/19  Yes Tobb, Kardie, DO  pravastatin (PRAVACHOL) 20 MG tablet Take 1 tablet (20 mg total) by mouth daily. 03/23/19  Yes Angiulli, Lavon Paganini, PA-C    VENTOLIN HFA 108 (90 Base) MCG/ACT inhaler Inhale 1 puff into the lungs every 4 (four) hours as needed for shortness of breath. 03/23/19  Yes Angiulli, Lavon Paganini, PA-C  zolpidem (AMBIEN) 10 MG tablet Take 10 mg by mouth at bedtime as needed for sleep.  05/19/19  Yes [provider]  metolazone (ZAROXOLYN) 5 MG tablet Take 1 tablet (5 mg total) by mouth daily. Take 30 minutes prior to AM furosemide dose 06/07/19   Tobb, Godfrey Pick, DO    Physical Exam:  Constitutional: NAD, calm, comfortable Vitals:   06/08/19 0400 06/08/19 0530 06/08/19 0645 06/08/19 0710  BP: 108/61 111/78 106/66   Pulse: 81   81  Resp: 11  12 15   Temp:      TempSrc:      SpO2: 98%   95%  Weight:      Height:       Eyes: PERRL, lids and conjunctivae normal ENMT: Mucous membranes are moist. Posterior pharynx clear of any exudate or lesions.Normal dentition.  Neck: normal, supple, no masses, no thyromegaly Respiratory: clear to auscultation bilaterally, no wheezing, no crackles. Normal respiratory effort. No accessory muscle use.  Cardiovascular: Regular rate and rhythm, no murmurs / rubs / gallops. No extremity edema. 2+ pedal pulses. No  carotid bruits.  Abdomen: no tenderness, no masses palpated. No hepatosplenomegaly. Bowel sounds positive.  Musculoskeletal: no clubbing / cyanosis. No joint deformity upper and lower extremities. Good ROM, no contractures. Normal muscle tone.  Skin: no rashes, lesions, ulcers. No induration Neurologic: CN 2-12 grossly intact. Sensation intact, DTR normal. Strength 5/5 in all 4.  Psychiatric: Normal judgment and insight. Alert and oriented x 3. Normal mood.     Labs on Admission: I have personally reviewed following labs and imaging studies  CBC: Recent Labs  Lab 06/07/19 2354  WBC 10.0  HGB 10.7*  HCT 35.3*  MCV 81.0  PLT 956*   Basic Metabolic Panel: Recent Labs  Lab 06/07/19 2354  NA 138  K 4.0  CL 103  CO2 24  GLUCOSE 106*  BUN 12  CREATININE 1.15   CALCIUM 9.0   GFR: Estimated Creatinine Clearance: 121.3 mL/min (by C-G formula based on SCr of 1.15 mg/dL). Liver Function Tests: No results for input(s): AST, ALT, ALKPHOS, BILITOT, PROT, ALBUMIN in the last 168 hours. No results for input(s): LIPASE, AMYLASE in the last 168 hours. No results for input(s): AMMONIA in the last 168 hours. Coagulation Profile: Recent Labs  Lab 06/07/19 2354  INR 1.1   Cardiac Enzymes: No results for input(s): CKTOTAL, CKMB, CKMBINDEX, TROPONINI in the last 168 hours. BNP (last 3 results) Recent Labs    01/24/19 1121 02/27/19 1106  PROBNP 52 37   HbA1C: No results for input(s): HGBA1C in the last 72 hours. CBG: No results for input(s): GLUCAP in the last 168 hours. Lipid Profile: No results for input(s): CHOL, HDL, LDLCALC, TRIG, CHOLHDL, LDLDIRECT in the last 72 hours. Thyroid Function Tests: No results for input(s): TSH, T4TOTAL, FREET4, T3FREE, THYROIDAB in the last 72 hours. Anemia Panel: No results for input(s): VITAMINB12, FOLATE, FERRITIN, TIBC, IRON, RETICCTPCT in the last 72 hours. Urine analysis:    Component Value Date/Time   COLORURINE STRAW (A) 03/04/2019 2334   APPEARANCEUR CLEAR 03/04/2019 2334   LABSPEC 1.021 03/04/2019 2334   PHURINE 6.0 03/04/2019 [place] 03/04/2019 [place] NEGATIVE 03/04/2019 2334   [place] 03/04/2019 [place] 03/04/2019 2334   PROTEINUR NEGATIVE 03/04/2019 2334   NITRITE NEGATIVE 03/04/2019 2334   LEUKOCYTESUR NEGATIVE 03/04/2019 2334   Sepsis Labs: No results found for this or any previous visit (from the past 240 hour(s)).   Radiological Exams on Admission: DG Chest 2 View  Result Date: 06/08/2019 CLINICAL DATA:  Central chest pain since this morning, hypertension EXAM: CHEST - 2 VIEW COMPARISON:  03/05/2019 FINDINGS: Frontal and lateral views of the chest demonstrate a stable cardiac silhouette. Chronic bibasilar scarring without airspace  disease, effusion, or pneumothorax. No acute bony abnormalities. IMPRESSION: 1. No acute intrathoracic process. Electronically Signed   By: Randa Ngo M.D.   On: 06/08/2019 00:27   CT Angio Chest PE W and/or Wo Contrast  Result Date: 06/08/2019 CLINICAL DATA:  Shortness of breath, centralized chest pain which began last night EXAM: CT ANGIOGRAPHY CHEST WITH CONTRAST TECHNIQUE: Multidetector CT imaging of the chest was performed using the standard protocol during bolus administration of intravenous contrast. Multiplanar CT image reconstructions and MIPs were obtained to evaluate the vascular anatomy. CONTRAST:  34m OMNIPAQUE IOHEXOL 350 MG/ML SOLN COMPARISON:  CT 03/02/2019 FINDINGS: Cardiovascular: Satisfactory opacification of the pulmonary arteries to the segmental level. Hypoattenuating filling defect is seen in the right lower lobar pulmonary artery extending into the posterior segmental and subsegmental  branches. No other pulmonary artery filling defects are identified. No elevation of the RV/LV ratio (0.75). Normal heart size. No pericardial effusion. The aorta is normal caliber. Normal 3 vessel branching of the arch. Mediastinum/Nodes: No mediastinal fluid or gas. Normal thyroid gland and thoracic inlet. No acute abnormality of the trachea or esophagus. Scattered subcentimeter low-attenuation shotty mediastinal adenopathy is similar to prior. No worrisome mediastinal, hilar or axillary adenopathy. Lungs/Pleura: Small right pleural effusion. Dependent atelectasis in the right lung base. No consolidation, features of edema, pneumothorax, or effusion. No suspicious pulmonary nodules or masses. Upper Abdomen: No acute abnormalities present in the visualized portions of the upper abdomen. Diffusely nodular hepatic surface contour. No visible hepatic lesions. Small accessory splenule Musculoskeletal: Multiple contiguous remote second through eighth anterolateral right rib deformities. Multilevel  degenerative changes are present in the imaged portions of the spine. No acute osseous abnormality or suspicious osseous lesion. No suspicious chest wall lesions. Mild bilateral gynecomastia. Review of the MIP images confirms the above findings. IMPRESSION: 1. Acute pulmonary embolus involving the right lower lobar pulmonary artery extending into the posterior segmental and subsegmental branches. No additional pulmonary emboli are identified. No evidence of right heart strain. 2. Small right pleural effusion. 3. Nodular hepatic surface contour suggestive of cirrhosis. These results were called by telephone at the time of interpretation on 06/08/2019 at 4:32 am to provider Veryl Speak , who verbally acknowledged these results. Electronically Signed   By: Lovena Le M.D.   On: 06/08/2019 04:32    EKG: Independently reviewed.  Normal sinus rhythm at 92 bpm  Assessment/Plan Pulmonary embolism: Acute.  Patient presents with complaints of right-sided chest pain with radiation to his back.  CT angiogram revealed a right-sided pulmonary embolus without significant signs of right heart strain. Found to be  febrile up to 100.4 F, but suspect secondary to pulmonary embolus.  -Admit to a medical telemetry bed -Continuous pulse oximetry with nasal cannula oxygen as needed to maintain O2 saturation -Continue heparin drip per pharmacy -Check echocardiogram -Check Doppler ultrasound of lower extremity  -Determine best anticoagulation/may need to consult IR if there are signs of bleeding and DVT on Doppler study  HFpEF: Patient reports worsening leg swelling bilaterally of the lower extremities.  On physical exam 2+ pitting edema appreciated.  Chest x-ray did not note any acute abnormalities.  Last echocardiogram from 02/2019 noted EF of 65 to 70%.   -Strict intake and output -Daily weights -Check a BNP -Continue home p.o. Lasix for now, but will consider IV diuresis if needed.  Essential hypertension: Home  blood pressure medications include Lasix 40 mg every morning/ 20 mg every evening, lisinopril 15 mg daily, and metolazone 5 mg daily before Lasix. -Continue current regimen  Normocytic anemia: Acute on chronic.  Hemoglobin 10.7 g/dL on admission, but previously noted to be 11.7 on 2/16.  Appears to be a 1 g/dL drop although patient denies any reports of blood in stool, but with previous history of GI bleed in November. -Type and screen for possible need of blood product -Check stool guaiac  -Recheck H&H  Herniated disc with chronic back pain: Patient on home medications of Suboxone 8-2 twice daily and gabapentin for treatment. -Continue current regimen  Hyperlipidemia: Home medications include pravastatin 20 mg nightly -Continue statin  Thrombocytopenia: Acute on chronic.  On admission platelet count 133.  Upon review of records seems to be more so chronic in nature and likely associated with history of [name].  [name]: Patient denies any history of  any significant alcohol use.  Imaging studies reveal signs of necrosis. -Continue to monitor  Insomnia -Continue Ambien as needed for sleep  Morbid obesity: BMI 40.34 kg/m. -Continue to recommend need of weight loss  GERD/history of GI bleed: Patient on Protonix 40 mg twice daily -Continue Protonix    DVT prophylaxis: Heparin Code Status: Full Family Communication: No family requested to be updated Disposition Plan: Likely discharge home in 1 day if medically stable Consults called: None Admission status: Observation  [name] MD [facility] Pager [phone]   If 7PM-7AM, please contact night-coverage [email or web address] Password [facility]  06/08/2019, 7:45 AM

## 2019-06-08 NOTE — ED Notes (Signed)
Pt returned from CT in stable condition. NAD. VSS.

## 2019-06-09 ENCOUNTER — Telehealth: Payer: Self-pay | Admitting: Cardiology

## 2019-06-09 DIAGNOSIS — I2699 Other pulmonary embolism without acute cor pulmonale: Principal | ICD-10-CM

## 2019-06-09 LAB — CBC
HCT: 34.9 % — ABNORMAL LOW (ref 39.0–52.0)
Hemoglobin: 10.8 g/dL — ABNORMAL LOW (ref 13.0–17.0)
MCH: 24.9 pg — ABNORMAL LOW (ref 26.0–34.0)
MCHC: 30.9 g/dL (ref 30.0–36.0)
MCV: 80.4 fL (ref 80.0–100.0)
Platelets: 156 10*3/uL (ref 150–400)
RBC: 4.34 MIL/uL (ref 4.22–5.81)
RDW: 16.4 % — ABNORMAL HIGH (ref 11.5–15.5)
WBC: 10 10*3/uL (ref 4.0–10.5)
nRBC: 0 % (ref 0.0–0.2)

## 2019-06-09 LAB — BASIC METABOLIC PANEL
Anion gap: 11 (ref 5–15)
BUN: 15 mg/dL (ref 8–23)
CO2: 28 mmol/L (ref 22–32)
Calcium: 9 mg/dL (ref 8.9–10.3)
Chloride: 97 mmol/L — ABNORMAL LOW (ref 98–111)
Creatinine, Ser: 1.09 mg/dL (ref 0.61–1.24)
GFR calc Af Amer: >60 mL/min (ref >60)
GFR calc non Af Amer: >60 mL/min (ref >60)
Glucose, Bld: 103 mg/dL — ABNORMAL HIGH (ref 70–99)
Potassium: 3.7 mmol/L (ref 3.5–5.1)
Sodium: 136 mmol/L (ref 135–145)

## 2019-06-09 LAB — HEPARIN LEVEL (UNFRACTIONATED): Heparin Unfractionated: 0.39 IU/mL (ref 0.30–0.70)

## 2019-06-09 NOTE — Evaluation (Signed)
Physical Therapy Evaluation Patient Details Name: Charles Gray MRN: [id number] DOB: 11/17/1955 Today's Date: 06/09/2019   History of Present Illness  Pt is a 64 y/o male admitted secondary to worsening chest pain and SOB. Found to have PE and LLE DVT. PMH includes HTN, CHF, GI bleed, and chronic back pain.   Clinical Impression  Pt admitted secondary to problem above with deficits below. Pt requiring supervision for safety for mobility. Pt very slow moving, however, tolerated well. Oxygen sats at 92-94% on RA throughout mobility. Pt voiced concerns about going home, but does not want to go SNF. Reports he lives alone, and son is available, but does not check in regularly. Feel he would benefit HHPT at d/c. Will continue to follow acutely to maximize functional mobility independence and safety.     Follow Up Recommendations Home health PT;Supervision for mobility/OOB    Equipment Recommendations  None recommended by PT    Recommendations for Other Services OT consult     Precautions / Restrictions Precautions Precautions: Fall Restrictions Weight Bearing Restrictions: No      Mobility  Bed Mobility Overal bed mobility: Needs Assistance Bed Mobility: Supine to Sit     Supine to sit: Supervision     General bed mobility comments: Supervision for safety.   Transfers Overall transfer level: Needs assistance Equipment used: Rolling walker (2 wheeled) Transfers: Sit to/from Stand Sit to Stand: Supervision;From elevated surface         General transfer comment: Supervision for safety.   Ambulation/Gait Ambulation/Gait assistance: Supervision Gait Distance (Feet): 25 Feet Assistive device: Rolling walker (2 wheeled) Gait Pattern/deviations: Step-through pattern;Decreased stride length;Wide base of support;Trunk flexed Gait velocity: Decreased   General Gait Details: Slow, cautious gait, however, overall steady with gait within the room. Oxygen sats at 92-94% on RA  throughout.   Stairs            Wheelchair Mobility    Modified Rankin (Stroke Patients Only)       Balance Overall balance assessment: Needs assistance Sitting-balance support: No upper extremity supported;Feet supported Sitting balance-Leahy Scale: Fair     Standing balance support: Bilateral upper extremity supported;During functional activity Standing balance-Leahy Scale: Poor Standing balance comment: Reliant on BUE support                              Pertinent Vitals/Pain Pain Assessment: Faces Faces Pain Scale: Hurts even more Pain Location: LLE and chest Pain Descriptors / Indicators: Aching;Operative site guarding Pain Intervention(s): Limited activity within patient's tolerance;Monitored during session;Repositioned    Home Living Family/patient expects to be discharged to:: Private residence Living Arrangements: Alone Available Help at Discharge: Family;Available PRN/intermittently Type of Home: Apartment Home Access: Stairs to enter Entrance Stairs-Rails: Right;Left Entrance Stairs-Number of Steps: 15 steps Home Layout: One level Home Equipment: Environmental consultant - 2 wheels      Prior Function Level of Independence: Independent with assistive device(s)         Comments: Has been using RW for ambulation      Hand Dominance        Extremity/Trunk Assessment   Upper Extremity Assessment Upper Extremity Assessment: Defer to OT evaluation    Lower Extremity Assessment Lower Extremity Assessment: Generalized weakness    Cervical / Trunk Assessment Cervical / Trunk Assessment: Other exceptions Cervical / Trunk Exceptions: chronic back pain   Communication   Communication: No difficulties  Cognition Arousal/Alertness: Awake/alert Behavior During Therapy: WFL for tasks assessed/performed  Overall Cognitive Status: Within Functional Limits for tasks assessed                                        General Comments  General comments (skin integrity, edema, etc.): Pt reports he is nervous about going home and would like to go to CIR. Explained criteria for CIR, and pt does not have support at d/c. Pt reports he does not want to go to SNF and would prefer to go home.     Exercises     Assessment/Plan    PT Assessment Patient needs continued PT services  PT Problem List Decreased strength;Decreased balance;Decreased mobility;Cardiopulmonary status limiting activity;Decreased activity tolerance       PT Treatment Interventions Stair training;Gait training;DME instruction;Functional mobility training;Therapeutic activities;Therapeutic exercise;Balance training;Patient/family education    PT Goals (Current goals can be found in the Care Plan section)  Acute Rehab PT Goals Patient Stated Goal: to get stronger PT Goal Formulation: With patient Time For Goal Achievement: 06/23/19 Potential to Achieve Goals: Good    Frequency Min 3X/week   Barriers to discharge Decreased caregiver support      Co-evaluation               AM-PAC PT "6 Clicks" Mobility  Outcome Measure Help needed turning from your back to your side while in a flat bed without using bedrails?: None Help needed moving from lying on your back to sitting on the side of a flat bed without using bedrails?: None Help needed moving to and from a bed to a chair (including a wheelchair)?: None Help needed standing up from a chair using your arms (e.g., wheelchair or bedside chair)?: None Help needed to walk in hospital room?: A Little Help needed climbing 3-5 steps with a railing? : A Little 6 Click Score: 22    End of Session Equipment Utilized During Treatment: Gait belt Activity Tolerance: Patient tolerated treatment well Patient left: in chair;with call bell/phone within reach Nurse Communication: Mobility status PT Visit Diagnosis: Muscle weakness (generalized) (M62.81)    Time: 6433-2951 PT Time Calculation (min) (ACUTE  ONLY): 30 min   Charges:   PT Evaluation $PT Eval Moderate Complexity: 1 Mod PT Treatments $Gait Training: 8-22 mins        Lou Miner, DPT  Acute Rehabilitation Services  Pager: [phone] Office: [phone]   Rudean Hitt 06/09/2019, 1:44 PM

## 2019-06-09 NOTE — Progress Notes (Signed)
Progress Note    Charles Gray  VEL:[id number] DOB: April 07, 1956  DOA: 06/07/2019 PCP: Enid Skeens., MD    Brief Narrative:     Medical records reviewed and are as summarized below:  [name] is an 64 y.o. male with medical history significant of hypertension, hyperlipidemia,  CHF, asthma, nonalcoholic fatty liver disease with signs of cirrhosis, herniated lumbar disks with chronic back pain, GI bleed, obesity, and GERD.  He presents with complaints of right-sided chest pain that started yesterday morning.  Describes pain as sharp with radiation to the right side of his back.  Symptoms worsen with trying to take deep breath in or coughing.   Found to have PE/DVT.  Assessment/Plan:   Principal Problem:   Pulmonary embolism (HCC) Active Problems:   Hyperlipidemia   Hypertension   Thrombocytopenia (HCC)   Cirrhosis of liver without ascites (HCC)   Chronic pain syndrome   Chronic heart failure with preserved ejection fraction (HCC)   Morbid obesity with BMI of 40.0-44.9, adult (HCC)   Normocytic anemia   Pulmonary emboli (HCC)   Acute respiratory failure -wean to room air as able  Pulmonary embolism/DVT: Acute.  Patient presents with complaints of right-sided chest pain with radiation to his back.  CT angiogram revealed a right-sided pulmonary embolus without significant signs of right heart strain. Found to be  febrile up to 100.4 F, but suspect secondary to pulmonary embolus.  -Continuous pulse oximetry with nasal cannula oxygen as needed to maintain O2 saturation -Continue heparin drip per pharmacy -duplex shows DVT in left leg -If hemoglobin and no signs of bleeding by tomorrow, plan to change to Eliquis and discharge  HFpEF: Patient reports worsening leg swelling bilaterally of the lower extremities.  On physical exam 2+ pitting edema appreciated.  Chest x-ray did not note any acute abnormalities.  Last echocardiogram from 02/2019 noted EF of 65 to 70%.    -Strict intake and output -Daily weights  Essential hypertension: Home blood pressure medications include Lasix 40 mg every morning/ 20 mg every evening, lisinopril 15 mg daily, and metolazone 5 mg daily before Lasix. -Continue current regimen  Normocytic anemia: Acute on chronic.  Hemoglobin 10.7 g/dL on admission, but previously noted to be 11.7 on 2/16.  Appears to be a 1 g/dL drop although patient denies any reports of blood in stool, but with previous history of GI bleed in November. -Type and screen for possible need of blood product -Check stool guaiac  -Discussed with GI Dr. Almyra Free about need for reevaluation prior to starting anticoagulation: He states no need for repeat EGD unless patient has GI bleed  Herniated disc with chronic back pain: Patient on home medications of Suboxone twice daily and gabapentin for treatment. -Continue current regimen  Hyperlipidemia: Home medications include pravastatin 20 mg nightly -Continue statin  Thrombocytopenia: Acute on chronic.  On admission platelet count 133.  Upon review of records seems to be more so chronic in nature and likely associated with history of [name].  [name]: Patient denies any history of any significant alcohol use.  -Continue to monitor  Insomnia -Continue Ambien as needed for sleep  Morbid obesity:  Estimated body mass index is 40.04 kg/m as calculated from the following:   Height as of this encounter: 6' 10"  (2.083 m).   Weight as of this encounter: 173.7 kg. -Continue to recommend weight loss  GERD/history of GI bleed: Patient on Protonix 40 mg twice daily -Continue Protonix      Family Communication/Anticipated D/C  date and plan/Code Status   DVT prophylaxis: Heparin drip Code Status: Full Code.  Family Communication:  Disposition Plan: We will continue heparin drip for now, will plan to transition to a NOAC if no GI bleeding.  Patient has Medicaid per TOC so either medication should be fine.  Plan  for Eliquis due to history of GI bleeding   Medical Consultants:    None.    Subjective:   Complaining of back pain with inspiration  Objective:    Vitals:   06/08/19 1218 06/08/19 1839 06/08/19 2339 06/09/19 0453  BP: (!) 98/57 102/60 98/63 116/69  Pulse: 81 80 85 82  Resp: 20 20 20 20   Temp:  99.8 F (37.7 C) 97.9 F (36.6 C) 98.8 F (37.1 C)  TempSrc:  Oral Oral Oral  SpO2: 97% 95% 100% 99%  Weight:    (!) 173.7 kg  Height:        Intake/Output Summary (Last 24 hours) at 06/09/2019 1116 Last data filed at 06/09/2019 8127 Gross per 24 hour  Intake 3255.54 ml  Output 3600 ml  Net -344.46 ml   Filed Weights   06/07/19 2345 06/08/19 0900 06/09/19 0453  Weight: (!) 175 kg (!) 175.5 kg (!) 173.7 kg    Exam: In bed, sleeping upright Clear but diminished Regular rate and rhythm Positive lower extremity edema Alert and oriented x3  Data Reviewed:   I have personally reviewed following labs and imaging studies:  Labs: Labs show the following:   Basic Metabolic Panel: Recent Labs  Lab 06/07/19 2354 06/08/19 1024 06/09/19 0531  NA 138  --  136  K 4.0  --  3.7  CL 103  --  97*  CO2 24  --  28  GLUCOSE 106*  --  103*  BUN 12  --  15  CREATININE 1.15  --  1.09  CALCIUM 9.0  --  9.0  MG  --  1.9  --    GFR Estimated Creatinine Clearance: 127.4 mL/min (by C-G formula based on SCr of 1.09 mg/dL). Liver Function Tests: No results for input(s): AST, ALT, ALKPHOS, BILITOT, PROT, ALBUMIN in the last 168 hours. No results for input(s): LIPASE, AMYLASE in the last 168 hours. No results for input(s): AMMONIA in the last 168 hours. Coagulation profile Recent Labs  Lab 06/07/19 2354  INR 1.1    CBC: Recent Labs  Lab 06/07/19 2354 06/08/19 0934 06/09/19 0531  WBC 10.0 10.0 10.0  HGB 10.7* 10.6* 10.8*  HCT 35.3* 35.0* 34.9*  MCV 81.0 81.2 80.4  PLT 133* 165 156   Cardiac Enzymes: No results for input(s): CKTOTAL, CKMB, CKMBINDEX, TROPONINI in  the last 168 hours. BNP (last 3 results) Recent Labs    01/24/19 1121 02/27/19 1106  PROBNP 52 37   CBG: No results for input(s): GLUCAP in the last 168 hours. D-Dimer: No results for input(s): DDIMER in the last 72 hours. Hgb A1c: No results for input(s): HGBA1C in the last 72 hours. Lipid Profile: No results for input(s): CHOL, HDL, LDLCALC, TRIG, CHOLHDL, LDLDIRECT in the last 72 hours. Thyroid function studies: No results for input(s): TSH, T4TOTAL, T3FREE, THYROIDAB in the last 72 hours.  Invalid input(s): FREET3 Anemia work up: No results for input(s): VITAMINB12, FOLATE, FERRITIN, TIBC, IRON, RETICCTPCT in the last 72 hours. Sepsis Labs: Recent Labs  Lab 06/07/19 2354 06/08/19 0934 06/09/19 0531  WBC 10.0 10.0 10.0    Microbiology Recent Results (from the past 240 hour(s))  SARS CORONAVIRUS 2 (  TAT 6-24 HRS) Nasopharyngeal Nasopharyngeal Swab     Status: None   Collection Time: 06/08/19  5:33 AM   Specimen: Nasopharyngeal Swab  Result Value Ref Range Status   SARS Coronavirus 2 NEGATIVE NEGATIVE Final    Comment: (NOTE) SARS-CoV-2 target nucleic acids are NOT DETECTED. The SARS-CoV-2 RNA is generally detectable in upper and lower respiratory specimens during the acute phase of infection. Negative results do not preclude SARS-CoV-2 infection, do not rule out co-infections with other pathogens, and should not be used as the sole basis for treatment or other patient management decisions. Negative results must be combined with clinical observations, patient history, and epidemiological information. The expected result is Negative. Fact Sheet for Patients: SugarRoll.be Fact Sheet for Healthcare Providers: https://www.woods-mathews.com/ This test is not yet approved or cleared by the [place] FDA and  has been authorized for detection and/or diagnosis of SARS-CoV-2 by FDA under an Emergency Use Authorization (EUA).  This EUA will remain  in effect (meaning this test can be used) for the duration of the COVID-19 declaration under Section 56 4(b)(1) of the Act, 21 U.S.C. section 360bbb-3(b)(1), unless the authorization is terminated or revoked sooner. Performed at [facility], [place] [address] [place], [place] [address]     Procedures and diagnostic studies:  DG Chest 2 View  Result Date: 06/08/2019 CLINICAL DATA:  Central chest pain since this morning, hypertension EXAM: CHEST - 2 VIEW COMPARISON:  03/05/2019 FINDINGS: Frontal and lateral views of the chest demonstrate a stable cardiac silhouette. Chronic bibasilar scarring without airspace disease, effusion, or pneumothorax. No acute bony abnormalities. IMPRESSION: 1. No acute intrathoracic process. Electronically Signed   By: Randa Ngo M.D.   On: 06/08/2019 00:27   CT Angio Chest PE W and/or Wo Contrast  Result Date: 06/08/2019 CLINICAL DATA:  Shortness of breath, centralized chest pain which began last night EXAM: CT ANGIOGRAPHY CHEST WITH CONTRAST TECHNIQUE: Multidetector CT imaging of the chest was performed using the standard protocol during bolus administration of intravenous contrast. Multiplanar CT image reconstructions and MIPs were obtained to evaluate the vascular anatomy. CONTRAST:  56m OMNIPAQUE IOHEXOL 350 MG/ML SOLN COMPARISON:  CT 03/02/2019 FINDINGS: Cardiovascular: Satisfactory opacification of the pulmonary arteries to the segmental level. Hypoattenuating filling defect is seen in the right lower lobar pulmonary artery extending into the posterior segmental and subsegmental branches. No other pulmonary artery filling defects are identified. No elevation of the RV/LV ratio (0.75). Normal heart size. No pericardial effusion. The aorta is normal caliber. Normal 3 vessel branching of the arch. Mediastinum/Nodes: No mediastinal fluid or gas. Normal thyroid gland and thoracic inlet. No acute abnormality of the trachea or esophagus.  Scattered subcentimeter low-attenuation shotty mediastinal adenopathy is similar to prior. No worrisome mediastinal, hilar or axillary adenopathy. Lungs/Pleura: Small right pleural effusion. Dependent atelectasis in the right lung base. No consolidation, features of edema, pneumothorax, or effusion. No suspicious pulmonary nodules or masses. Upper Abdomen: No acute abnormalities present in the visualized portions of the upper abdomen. Diffusely nodular hepatic surface contour. No visible hepatic lesions. Small accessory splenule Musculoskeletal: Multiple contiguous remote second through eighth anterolateral right rib deformities. Multilevel degenerative changes are present in the imaged portions of the spine. No acute osseous abnormality or suspicious osseous lesion. No suspicious chest wall lesions. Mild bilateral gynecomastia. Review of the MIP images confirms the above findings. IMPRESSION: 1. Acute pulmonary embolus involving the right lower lobar pulmonary artery extending into the posterior segmental and subsegmental branches. No additional pulmonary emboli are  identified. No evidence of right heart strain. 2. Small right pleural effusion. 3. Nodular hepatic surface contour suggestive of cirrhosis. These results were called by telephone at the time of interpretation on 06/08/2019 at 4:32 am to provider Veryl Speak , who verbally acknowledged these results. Electronically Signed   By: Lovena Le M.D.   On: 06/08/2019 04:32   VAS [place] LOWER EXTREMITY VENOUS (DVT)  Result Date: 06/08/2019  Lower Venous DVTStudy Indications: Edema.  Risk Factors: Confirmed PE. Limitations: Body habitus and poor ultrasound/tissue interface. Comparison Study: LEV 03-14-19, negative. Performing Technologist: [name] Crown ARDMS, RVT  Examination Guidelines: A complete evaluation includes B-mode imaging, spectral Doppler, color Doppler, and power Doppler as needed of all accessible portions of each vessel. Bilateral testing is  considered an integral part of a complete examination. Limited examinations for reoccurring indications may be performed as noted. The reflux portion of the exam is performed with the patient in reverse Trendelenburg.  +---------+---------------+---------+-----------+----------+------------------+ RIGHT    CompressibilityPhasicitySpontaneityPropertiesThrombus Aging     +---------+---------------+---------+-----------+----------+------------------+ CFV      Full           Yes      Yes                                     +---------+---------------+---------+-----------+----------+------------------+ SFJ      Full                                                            +---------+---------------+---------+-----------+----------+------------------+ FV Prox  Full                                                            +---------+---------------+---------+-----------+----------+------------------+ FV Mid   Full                                                            +---------+---------------+---------+-----------+----------+------------------+ FV DistalFull                                                            +---------+---------------+---------+-----------+----------+------------------+ PFV      Full                                                            +---------+---------------+---------+-----------+----------+------------------+ POP      Full           Yes      Yes                                     +---------+---------------+---------+-----------+----------+------------------+  PTV      Full                                         visualized with                                                          color              +---------+---------------+---------+-----------+----------+------------------+ PERO                                                  Not visualized      +---------+---------------+---------+-----------+----------+------------------+   +---------+---------------+---------+-----------+----------+-------------------+ LEFT     CompressibilityPhasicitySpontaneityPropertiesThrombus Aging      +---------+---------------+---------+-----------+----------+-------------------+ CFV      Full           Yes      Yes                                      +---------+---------------+---------+-----------+----------+-------------------+ SFJ      Full                                                             +---------+---------------+---------+-----------+----------+-------------------+ FV Prox  Full                                                             +---------+---------------+---------+-----------+----------+-------------------+ FV Mid   Full                                                             +---------+---------------+---------+-----------+----------+-------------------+ FV Distal                                             unable to tolerate                                                        comp, visualized  with color          +---------+---------------+---------+-----------+----------+-------------------+ POP      Partial        No       No                                       +---------+---------------+---------+-----------+----------+-------------------+ PTV      Full                                         visualized with                                                           color               +---------+---------------+---------+-----------+----------+-------------------+ PERO                                                  Not visualized      +---------+---------------+---------+-----------+----------+-------------------+    Summary: RIGHT: - There is no evidence of deep vein thrombosis in the lower  extremity. - No cystic structure found in the popliteal fossa. - Poorly visualized calf veins due to patient body habitus and swelling.  LEFT: - Findings consistent with acute deep vein thrombosis involving the left popliteal vein. - No cystic structure found in the popliteal fossa. - Poorly visualized calf veins due to patient body habitus and swelling.  *See table(s) above for measurements and observations. Electronically signed by Monica Martinez MD on 06/08/2019 at 2:02:37 PM.    Final    ECHOCARDIOGRAM LIMITED  Result Date: 06/08/2019    ECHOCARDIOGRAM LIMITED REPORT   Patient Name:   AVIK LEONI Date of Exam: 06/08/2019 Medical Rec #:  [id number]       Height:       82.0 in Accession #:    [id number]      Weight:       387.0 lb Date of Birth:  June 17, 1955       BSA:          3.099 m Patient Age:    32 years        BP:           115/70 mmHg Patient Gender: M               HR:           77 bpm. Exam Location:  Inpatient Procedure: Limited Echo, Color Doppler and Intracardiac Opacification Agent Indications:    Pulmonary Embolus  History:        Patient has prior history of Echocardiogram examinations, most                 recent 03/14/2019. CHF, Signs/Symptoms:Chest Pain; Risk                 Factors:Hypertension, Dyslipidemia and Morbid obesity. Pulmonary                 embolus.  Sonographer:  Dustin Flock Referring Phys: [id number] RONDELL A SMITH  Sonographer Comments: Technically difficult study due to poor echo windows and patient is morbidly obese. Image acquisition challenging due to patient body habitus and too painful. IMPRESSIONS  1. Left ventricular ejection fraction, by estimation, is 55 to 60%. The left ventricle has normal function. The left ventricle has no regional wall motion abnormalities. Left ventricular diastolic function could not be evaluated.  2. Right ventricular systolic function was not well visualized. The right ventricular size is normal. Tricuspid regurgitation signal is  inadequate for assessing PA pressure.  3. The mitral valve was not well visualized.  4. The aortic valve was not well visualized. Aortic valve regurgitation is not visualized. Mild aortic valve sclerosis is present, with no evidence of aortic valve stenosis. FINDINGS  Left Ventricle: Left ventricular ejection fraction, by estimation, is 55 to 60%. The left ventricle has normal function. The left ventricle has no regional wall motion abnormalities. Definity contrast agent was given IV to delineate the left ventricular  endocardial borders. The left ventricular internal cavity size was normal in size. There is no left ventricular hypertrophy. Right Ventricle: The right ventricular size is normal. Right vetricular wall thickness was not assessed. Right ventricular systolic function was not well visualized. Tricuspid regurgitation signal is inadequate for assessing PA pressure. Left Atrium: Left atrial size was not well visualized. Right Atrium: Right atrial size was not well visualized. Pericardium: There is no evidence of pericardial effusion. Mitral Valve: The mitral valve was not well visualized. Normal mobility of the mitral valve leaflets. Tricuspid Valve: The tricuspid valve is not well visualized. No evidence of tricuspid stenosis. Aortic Valve: The aortic valve was not well visualized. Aortic valve regurgitation is not visualized. Mild aortic valve sclerosis is present, with no evidence of aortic valve stenosis. Pulmonic Valve: The pulmonic valve was not well visualized. Pulmonic valve regurgitation is not visualized. Aorta: Aortic root could not be assessed and the aortic root was not well visualized. Venous: The inferior vena cava was not well visualized. IAS/Shunts: No atrial level shunt detected by color flow Doppler.  LEFT VENTRICLE PLAX 2D LVIDd:         5.40 cm LVIDs:         4.10 cm LV PW:         1.40 cm LV IVS:        1.30 cm LVOT diam:     2.40 cm LVOT Area:     4.52 cm  LEFT ATRIUM         Index LA  diam:    4.10 cm 1.32 cm/m   AORTA Ao Root diam: 3.30 cm  SHUNTS Systemic Diam: 2.40 cm Fransico Him MD Electronically signed by Fransico Him MD Signature Date/Time: 06/08/2019/11:10:34 AM    Final     Medications:   . buprenorphine  8 mg Sublingual BID  . furosemide  20 mg Oral Q1500  . furosemide  40 mg Oral Daily  . gabapentin  800 mg Oral TID  . lisinopril  15 mg Oral Daily  . metolazone  5 mg Oral Daily  . pantoprazole  40 mg Oral BID  . potassium chloride  20 mEq Oral BID  . pravastatin  20 mg Oral Daily  . sodium chloride flush  3 mL Intravenous Once   Continuous Infusions: . heparin 2,550 Units/hr (06/09/19 0929)     LOS: 1 day   Geradine Girt  [facility]   How to contact the [facility] Attending or  Consulting provider [place] or covering provider during after hours [place], for this patient?  1. Check the care team in [facility] and look for a) attending/consulting [facility] provider listed and b) the [facility] team listed 2. Log into [email or web address] and use [place]'s universal password to access. If you do not have the password, please contact the hospital operator. 3. Locate the [facility] provider you are looking for under [facility] and page to a number that you can be directly reached. 4. If you still have difficulty reaching the provider, please page the [facility] (Director on Call) for the Hospitalists listed on amion for assistance.  06/09/2019, 11:16 AM

## 2019-06-09 NOTE — Telephone Encounter (Signed)
Patient wanted to advise Dr. Harriet Masson that he has been admitted to [facility] since 06/07/19 for blood clots in his lungs and legs.

## 2019-06-09 NOTE — TOC Initial Note (Addendum)
Transition of Care [facility]) - Initial/Assessment Note    Patient Details  Name: Charles Gray MRN: [id number] Date of Birth: [birth year]  [facility]) CM/SW Contact:    Marilu Favre, RN Phone Number: 06/09/2019, 12:18 PM  Clinical Narrative:                 Patient from home alone. Has a son who checks on him . Has HHPT and RN with [facility] entered orders and call Butch Penny. Has walker and tub bench.   Has prescription coverage with Medicaid     Expected Discharge Plan: Home/Self Care Barriers to Discharge: Continued Medical Work up   Patient Goals and CMS Choice Patient states their goals for this hospitalization and ongoing recovery are:: to go home CMS Medicare.gov Compare Post Acute Care list provided to:: Patient    Expected Discharge Plan and Services Expected Discharge Plan: Home/Self Care   Discharge Planning Services: CM Consult, Medication Assistance   Living arrangements for the past 2 months: Single Family Home                 DME Arranged: N/A         HH Arranged: NA          Prior Living Arrangements/Services Living arrangements for the past 2 months: Single Family Home Lives with:: Self Patient language and need for interpreter reviewed:: Yes        Need for Family Participation in Patient Care: Yes (Comment) Care giver support system in place?: Yes (comment) Current home services: DME Criminal Activity/Legal Involvement Pertinent to Current Situation/Hospitalization: No - Comment as needed  Activities of Daily Living Home Assistive Devices/Equipment: Walker (specify type) ADL Screening (condition at time of admission) Patient's cognitive ability adequate to safely complete daily activities?: Yes Is the patient deaf or have difficulty hearing?: No Does the patient have difficulty seeing, even when wearing glasses/contacts?: No Does the patient have difficulty concentrating, remembering, or making decisions?: No Patient able to  express need for assistance with ADLs?: Yes Does the patient have difficulty dressing or bathing?: No Independently performs ADLs?: Yes (appropriate for developmental age) Does the patient have difficulty walking or climbing stairs?: Yes Weakness of Legs: Both Weakness of Arms/Hands: Both  Permission Sought/Granted   Permission granted to share information with : No              Emotional Assessment Appearance:: Appears stated age Attitude/Demeanor/Rapport: Engaged Affect (typically observed): Accepting Orientation: : Oriented to Self, Oriented to Place, Oriented to  Time, Oriented to Situation Alcohol / Substance Use: Not Applicable Psych Involvement: No (comment)  Admission diagnosis:  Pulmonary embolism (HCC) [I26.99] Acute pulmonary embolism without acute cor pulmonale, unspecified pulmonary embolism type (HCC) [I26.99] Pulmonary emboli (HCC) [I26.99] Patient Active Problem List   Diagnosis Date Noted  . Pulmonary embolism ([place]) 06/08/2019  . Normocytic anemia 06/08/2019  . Pulmonary emboli ([place]) 06/08/2019  . Chronic heart failure with preserved ejection fraction ([place]) 05/30/2019  . Morbid obesity with BMI of 40.0-44.9, adult ([place]) 05/30/2019  . Hyponatremia   . Benign essential HTN   . Drug induced constipation   . Peripheral edema   . Hypotension due to drugs   . Hypoalbuminemia due to protein-calorie malnutrition ([place])   . Hypokalemia   . Thrombocytopenia ([place])   . Anemia of chronic disease   . Cirrhosis of liver without ascites ([place])   . Chronic pain syndrome   . Debility 03/13/2019  . Acute respiratory failure with hypoxia ([place])   .  Shock ([place])   . Dyspnea   . GIB (gastrointestinal bleeding) 03/04/2019  . Hyperlipidemia 01/24/2019  . Hypertension 01/24/2019  . Insomnia 01/24/2019   PCP:  Enid Skeens., MD Pharmacy:   [facility], [place] [address] Phone: [phone] Fax:  [phone]     Social Determinants of Health (SDOH) Interventions    Readmission Risk Interventions No flowsheet data found.

## 2019-06-09 NOTE — Evaluation (Signed)
Occupational Therapy Evaluation Patient Details Name: Charles Gray MRN: [id number] DOB: 11-Sep-1955 Today's Date: 06/09/2019    History of Present Illness Pt is a 64 y/o male admitted secondary to worsening chest pain and SOB. Found to have PE and LLE DVT. PMH includes HTN, CHF, GI bleed, and chronic back pain.    Clinical Impression   Pt with decline in function and safety with ADLs and ADL mobility with impaired strength, balance and endurance. Pt limited by pain in L LE and chest area. Pt lives at home alone in second floor apartment and reports that he was using a RW for mobility and reports that he was independent with ADLs, selfcare but was starting to have difficulty due to low endurance and that he cannot stand in shower to complete bathing nor can he stand in kitchen for tasks. Pt currently requires min A with LB ADLs, min guard A with grooming/hygiene at sink, mi guard A with toileting and sup with mobility using RW. Pt would benefit from acute OT services to address impairments to maximize level of function and safety    Follow Up Recommendations  Home health OT    Equipment Recommendations  Tub/shower bench;Other (comment)(ADL A/E kit)    Recommendations for Other Services       Precautions / Restrictions Precautions Precautions: Fall Restrictions Weight Bearing Restrictions: No      Mobility Bed Mobility Overal bed mobility: Needs Assistance Bed Mobility: Supine to Sit     Supine to sit: Supervision     General bed mobility comments: pt in recliner upon arrival  Transfers Overall transfer level: Needs assistance Equipment used: Rolling walker (2 wheeled) Transfers: Sit to/from Stand Sit to Stand: Supervision         General transfer comment: Supervision for safety.     Balance Overall balance assessment: Needs assistance Sitting-balance support: No upper extremity supported;Feet supported Sitting balance-Leahy Scale: Fair     Standing balance  support: Bilateral upper extremity supported;During functional activity Standing balance-Leahy Scale: Poor Standing balance comment: Reliant on BUE support                            ADL either performed or assessed with clinical judgement   ADL Overall ADL's : Needs assistance/impaired Eating/Feeding: Independent;Sitting   Grooming: Wash/dry hands;Wash/dry face;Supervision/safety;Standing   Upper Body Bathing: Set up;Supervision/ safety;Sitting   Lower Body Bathing: Minimal assistance   Upper Body Dressing : Set up;Supervision/safety;Sitting   Lower Body Dressing: Minimal assistance   Toilet Transfer: Supervision/safety;Ambulation;RW   Toileting- Water quality scientist and Hygiene: Min guard;Sit to/from stand       Functional mobility during ADLs: Supervision/safety       Vision Patient Visual Report: No change from baseline       Perception     Praxis      Pertinent Vitals/Pain Pain Assessment: 0-10 Pain Score: 5  Faces Pain Scale: Hurts even more Pain Location: LLE and chest Pain Descriptors / Indicators: Aching;Operative site guarding Pain Intervention(s): Limited activity within patient's tolerance;Monitored during session;Repositioned     Hand Dominance Right   Extremity/Trunk Assessment Upper Extremity Assessment Upper Extremity Assessment: Generalized weakness   Lower Extremity Assessment Lower Extremity Assessment: Defer to PT evaluation   Cervical / Trunk Assessment Cervical / Trunk Assessment: Other exceptions Cervical / Trunk Exceptions: chronic back pain    Communication Communication Communication: No difficulties   Cognition Arousal/Alertness: Awake/alert Behavior During Therapy: WFL for tasks assessed/performed Overall Cognitive Status:  Within Functional Limits for tasks assessed                                     General Comments  Pt reports he is nervous about going home and would like to go to CIR.  Explained criteria for CIR, and pt does not have support at d/c. Pt reports he does not want to go to SNF and would prefer to go home.     Exercises     Shoulder Instructions      Home Living Family/patient expects to be discharged to:: Private residence Living Arrangements: Alone Available Help at Discharge: Family;Available PRN/intermittently Type of Home: Apartment Home Access: Stairs to enter Entrance Stairs-Number of Steps: 16 steps Entrance Stairs-Rails: Right;Left Home Layout: One level     Bathroom Shower/Tub: Teacher, early years/pre: Standard Bathroom Accessibility: No   Home Equipment: Environmental consultant - 2 wheels          Prior Functioning/Environment Level of Independence: Independent with assistive device(s)    ADL's / Homemaking Assistance Needed: reports that he was independent with ADLs, selfcare but was starting to have difficulty due to low endurance and that he cannot stand in shower to complete bathing nor can he stand in kitchen for tasks   Comments: Has been using RW for ambulation         OT Problem List: Decreased strength;Impaired balance (sitting and/or standing);Decreased activity tolerance;Decreased knowledge of use of DME or AE      OT Treatment/Interventions: Self-care/ADL training;DME and/or AE instruction;Therapeutic activities;Therapeutic exercise;Energy conservation;Patient/family education    OT Goals(Current goals can be found in the care plan section) Acute Rehab OT Goals Patient Stated Goal: to get stronger OT Goal Formulation: With patient Time For Goal Achievement: 06/09/19 Potential to Achieve Goals: Good ADL Goals Pt Will Perform Grooming: with supervision;with set-up;with modified independence;standing Pt Will Perform Lower Body Bathing: with min guard assist;with supervision;with set-up;with adaptive equipment Pt Will Perform Lower Body Dressing: with min guard assist;with supervision;with set-up;with adaptive equipment Pt  Will Transfer to Toilet: with modified independence;ambulating Pt Will Perform Toileting - Clothing Manipulation and hygiene: with supervision;with modified independence;sit to/from stand Pt Will Perform Tub/Shower Transfer: with supervision;with modified independence;ambulating;rolling walker;tub bench;3 in 1 Additional ADL Goal #1: Pt will verbalize and demo 3 energy conservation techniques for ADLs and ADL mobility (provide handout)  OT Frequency: Min 2X/week   Barriers to D/C:            Co-evaluation              AM-PAC OT "6 Clicks" Daily Activity     Outcome Measure Help from another person eating meals?: None Help from another person taking care of personal grooming?: A Little Help from another person toileting, which includes using toliet, bedpan, or urinal?: A Little Help from another person bathing (including washing, rinsing, drying)?: A Little Help from another person to put on and taking off regular upper body clothing?: None Help from another person to put on and taking off regular lower body clothing?: A Little 6 Click Score: 20   End of Session Equipment Utilized During Treatment: Rolling walker  Activity Tolerance: Patient limited by fatigue Patient left: in chair;with call bell/phone within reach  OT Visit Diagnosis: Unsteadiness on feet (R26.81);Other abnormalities of gait and mobility (R26.89);Muscle weakness (generalized) (M62.81);Pain Pain - Right/Left: Left Pain - part of body: Leg(chest)  Time: 2820-6015 OT Time Calculation (min): 21 min Charges:  OT General Charges $OT Visit: 1 Visit OT Evaluation $OT Eval Moderate Complexity: 1 Mod    Britt Bottom 06/09/2019, 3:12 PM

## 2019-06-09 NOTE — Progress Notes (Signed)
ANTICOAGULATION CONSULT NOTE - Follow Up Consult  Pharmacy Consult for Heparin Indication: DVT and PE  Allergies  Allergen Reactions  . Penicillins     Did it involve swelling of the face/tongue/throat, SOB, or low BP? N/A Did it involve sudden or severe rash/hives, skin peeling, or any reaction on the inside of your mouth or nose? N/A Did you need to seek medical attention at a hospital or doctor's office?N/A When did it last happen?N/A If all above answers are "NO", may proceed with cephalosporin use.    Patient Measurements: Height: 6' 10"  (208.3 cm) Weight: (!) 382 lb 14.4 oz (173.7 kg) IBW/kg (Calculated) : 100.6 Heparin Dosing Weight:  140.7 kg  Vital Signs: Temp: 98.8 F (37.1 C) (02/26 0453) Temp Source: Oral (02/26 0453) BP: 116/69 (02/26 0453) Pulse Rate: 82 (02/26 0453)  Labs: Recent Labs    06/07/19 2354 06/07/19 2354 06/08/19 0158 06/08/19 0934 06/08/19 1200 06/08/19 1930 06/09/19 0531  HGB 10.7*   < >  --  10.6*  --   --  10.8*  HCT 35.3*  --   --  35.0*  --   --  34.9*  PLT 133*  --   --  165  --   --  156  LABPROT 13.8  --   --   --   --   --   --   INR 1.1  --   --   --   --   --   --   HEPARINUNFRC  --   --   --   --  0.30 0.30 0.39  CREATININE 1.15  --   --   --   --   --  1.09  TROPONINIHS 3  --  3  --   --   --   --    < > = values in this interval not displayed.    Estimated Creatinine Clearance: 127.4 mL/min (by C-G formula based on SCr of 1.09 mg/dL).  Assessment: Anticoag: Heparin for PE + DVT.2/25: acute deep vein thrombosis involving the left popliteal vein. - HL 0.39 in goal. Hgb 10.8 stable. Plts 156 stable.  Goal of Therapy:  Heparin level 0.3-0.7 units/ml Monitor platelets by anticoagulation protocol: Yes   Plan: Con't IV heparin at  2550 units/hr Monitor daily heparin level and CBC, s/sx bleeding    Shamonica Schadt S. Alford Highland, PharmD, BCPS Clinical Staff Pharmacist [email or web address] Alford Highland, [employer] 06/09/2019,8:30  AM

## 2019-06-10 LAB — HEPARIN LEVEL (UNFRACTIONATED): Heparin Unfractionated: 0.52 IU/mL (ref 0.30–0.70)

## 2019-06-10 LAB — CBC
HCT: 35.2 % — ABNORMAL LOW (ref 39.0–52.0)
Hemoglobin: 10.8 g/dL — ABNORMAL LOW (ref 13.0–17.0)
MCH: 24.7 pg — ABNORMAL LOW (ref 26.0–34.0)
MCHC: 30.7 g/dL (ref 30.0–36.0)
MCV: 80.4 fL (ref 80.0–100.0)
Platelets: 153 10*3/uL (ref 150–400)
RBC: 4.38 MIL/uL (ref 4.22–5.81)
RDW: 16.3 % — ABNORMAL HIGH (ref 11.5–15.5)
WBC: 8.2 10*3/uL (ref 4.0–10.5)
nRBC: 0 % (ref 0.0–0.2)

## 2019-06-10 LAB — BASIC METABOLIC PANEL
Anion gap: 10 (ref 5–15)
BUN: 14 mg/dL (ref 8–23)
CO2: 33 mmol/L — ABNORMAL HIGH (ref 22–32)
Calcium: 9 mg/dL (ref 8.9–10.3)
Chloride: 92 mmol/L — ABNORMAL LOW (ref 98–111)
Creatinine, Ser: 1.06 mg/dL (ref 0.61–1.24)
GFR calc Af Amer: >60 mL/min (ref >60)
GFR calc non Af Amer: >60 mL/min (ref >60)
Glucose, Bld: 93 mg/dL (ref 70–99)
Potassium: 2.9 mmol/L — ABNORMAL LOW (ref 3.5–5.1)
Sodium: 135 mmol/L (ref 135–145)

## 2019-06-10 MED ORDER — APIXABAN 5 MG PO TABS: 5.0000 mg | ORAL_TABLET | Freq: Two times a day (BID) | ORAL | Status: DC

## 2019-06-10 MED ORDER — APIXABAN 5 MG PO TABS
10.0000 mg | ORAL_TABLET | Freq: Two times a day (BID) | ORAL | Status: DC
  Administered 2019-06-10 – 2019-06-13 (×7): 10 mg via ORAL
  Filled 2019-06-10 (×7): qty 2

## 2019-06-10 MED ORDER — POTASSIUM CHLORIDE CRYS ER 20 MEQ PO TBCR
40.0000 meq | EXTENDED_RELEASE_TABLET | Freq: Three times a day (TID) | ORAL | Status: AC
  Administered 2019-06-10 – 2019-06-11 (×6): 40 meq via ORAL
  Filled 2019-06-10 (×6): qty 2

## 2019-06-10 NOTE — Discharge Instructions (Signed)
Information on my medicine - ELIQUIS (apixaban)  This medication education was reviewed with me or my healthcare representative as part of my discharge preparation.    Why was Eliquis prescribed for you? Eliquis was prescribed to treat blood clots that may have been found in the veins of your legs (deep vein thrombosis) or in your lungs (pulmonary embolism) and to reduce the risk of them occurring again.  What do You need to know about Eliquis ? The starting dose is 10 mg (two 5 mg tablets) taken TWICE daily for the FIRST SEVEN (7) DAYS, then on (enter date)  06/17/19  the dose is reduced to ONE 5 mg tablet taken TWICE daily.  Eliquis may be taken with or without food.   Try to take the dose about the same time in the morning and in the evening. If you have difficulty swallowing the tablet whole please discuss with your pharmacist how to take the medication safely.  Take Eliquis exactly as prescribed and DO NOT stop taking Eliquis without talking to the doctor who prescribed the medication.  Stopping may increase your risk of developing a new blood clot.  Refill your prescription before you run out.  After discharge, you should have regular check-up appointments with your healthcare provider that is prescribing your Eliquis.    What do you do if you miss a dose? If a dose of ELIQUIS is not taken at the scheduled time, take it as soon as possible on the same day and twice-daily administration should be resumed. The dose should not be doubled to make up for a missed dose.  Important Safety Information A possible side effect of Eliquis is bleeding. You should call your healthcare provider right away if you experience any of the following: ? Bleeding from an injury or your nose that does not stop. ? Unusual colored urine (red or dark brown) or unusual colored stools (red or black). ? Unusual bruising for unknown reasons. ? A serious fall or if you hit your head (even if there is no  bleeding).  Some medicines may interact with Eliquis and might increase your risk of bleeding or clotting while on Eliquis. To help avoid this, consult your healthcare provider or pharmacist prior to using any new prescription or non-prescription medications, including herbals, vitamins, non-steroidal anti-inflammatory drugs (NSAIDs) and supplements.  This website has more information on Eliquis (apixaban): http://[email or web address].com/eliquis/home

## 2019-06-10 NOTE — Progress Notes (Signed)
Progress Note    Charles Gray  KZL:[id number] DOB: 1956/02/08  DOA: 06/07/2019 PCP: Enid Skeens., MD    Brief Narrative:     Medical records reviewed and are as summarized below:  Charles Gray is an 64 y.o. male with medical history significant of hypertension, hyperlipidemia,  CHF, asthma, nonalcoholic fatty liver disease with signs of cirrhosis, herniated lumbar disks with chronic back pain, GI bleed, obesity, and GERD.  He presents with complaints of right-sided chest pain that started yesterday morning.  Describes pain as sharp with radiation to the right side of his back.  Symptoms worsen with trying to take deep breath in or coughing.   Found to have PE/DVT.  Assessment/Plan:   Principal Problem:   Pulmonary embolism (HCC) Active Problems:   Hyperlipidemia   Hypertension   Thrombocytopenia (HCC)   Cirrhosis of liver without ascites (HCC)   Chronic pain syndrome   Chronic heart failure with preserved ejection fraction (HCC)   Morbid obesity with BMI of 40.0-44.9, adult (HCC)   Normocytic anemia   Pulmonary emboli (HCC)   Acute respiratory failure -wean to room air   Pulmonary embolism/DVT: Acute.  Patient presents with complaints of right-sided chest pain with radiation to his back.  CT angiogram revealed a right-sided pulmonary embolus without significant signs of right heart strain. Found to be  febrile up to 100.4 F, but suspect secondary to pulmonary embolus.  -Continuous pulse oximetry with nasal cannula oxygen as needed to maintain O2 saturation -Change heparin drip to Eliquis: Per TOC patient has Medicaid secondary so all medication should be $4 -duplex shows DVT in left leg  HFpEF: Patient reports worsening leg swelling bilaterally of the lower extremities.  On physical exam 2+ pitting edema appreciated.  Chest x-ray did not note any acute abnormalities.  Last echocardiogram from 02/2019 noted EF of 65 to 70%.   -Strict intake and output -Daily  weights  Essential hypertension: Home blood pressure medications include Lasix 40 mg every morning/ 20 mg every evening, lisinopril 15 mg daily, and metolazone 5 mg daily before Lasix. -Continue current regimen - his potassium dropped sharply, will replace... Suspect due to metolazone  Normocytic anemia: Acute on chronic.  Hemoglobin 10.7 g/dL on admission, but previously noted to be 11.7 on 2/16.  Appears to be a 1 g/dL drop although patient denies any reports of blood in stool, but with previous history of GI bleed in November. -Type and screen for possible need of blood product -Check stool guaiac  -Discussed with GI Dr. Almyra Free about need for reevaluation prior to starting anticoagulation: He states no need for repeat EGD unless patient has GI bleed  Herniated disc with chronic back pain: Patient on home medications of Suboxone twice daily and gabapentin for treatment. -Continue current regimen  Hyperlipidemia: Home medications include pravastatin 20 mg nightly -Continue statin  Thrombocytopenia: Acute on chronic.  On admission platelet count 133.  Upon review of records seems to be more so chronic in nature and likely associated with history of [name].  [name]: Patient denies any history of any significant alcohol use.  -Continue to monitor  Insomnia -Continue Ambien as needed for sleep  Morbid obesity:  Estimated body mass index is 39.03 kg/m as calculated from the following:   Height as of this encounter: 6' 10"  (2.083 m).   Weight as of this encounter: 169.3 kg. -Continue to recommend weight loss  GERD/history of GI bleed: Patient on Protonix 40 mg twice daily -Continue Protonix  Family Communication/Anticipated D/C date and plan/Code Status   DVT prophylaxis: Heart Eliquis Code Status: Full Code.  Family Communication:  Disposition Plan: Hope for discharge in the next 24 to 48 hours  Medical Consultants:    GI (phone)    Subjective:   Says he does  not have transportation to the pharmacy or to the doctor, feels like he needs an aide at home  Objective:    Vitals:   06/10/19 0451 06/10/19 0452 06/10/19 0846 06/10/19 0913  BP:  133/77 (!) 90/55 126/73  Pulse:  71 72 73  Resp:   20   Temp:  98.1 F (36.7 C) 98 F (36.7 C)   TempSrc:  Oral Oral   SpO2:  98% 95% 99%  Weight: (!) 169.3 kg     Height:        Intake/Output Summary (Last 24 hours) at 06/10/2019 1147 Last data filed at 06/10/2019 1118 Gross per 24 hour  Intake 332.85 ml  Output 3175 ml  Net -2842.15 ml   Filed Weights   06/08/19 0900 06/09/19 0453 06/10/19 0451  Weight: (!) 175.5 kg (!) 173.7 kg (!) 169.3 kg    Exam: In bed, morbidly obese No increased work of breathing Regular rate and rhythm Obese abdomen Positive lower extremity edema with chronic changes to the skin  Data Reviewed:   I have personally reviewed following labs and imaging studies:  Labs: Labs show the following:   Basic Metabolic Panel: Recent Labs  Lab 06/07/19 2354 06/07/19 2354 06/08/19 1024 06/09/19 0531 06/10/19 0435  NA 138  --   --  136 135  K 4.0   < >  --  3.7 2.9*  CL 103  --   --  97* 92*  CO2 24  --   --  28 33*  GLUCOSE 106*  --   --  103* 93  BUN 12  --   --  15 14  CREATININE 1.15  --   --  1.09 1.06  CALCIUM 9.0  --   --  9.0 9.0  MG  --   --  1.9  --   --    < > = values in this interval not displayed.   GFR Estimated Creatinine Clearance: 129.2 mL/min (by C-G formula based on SCr of 1.06 mg/dL). Liver Function Tests: No results for input(s): AST, ALT, ALKPHOS, BILITOT, PROT, ALBUMIN in the last 168 hours. No results for input(s): LIPASE, AMYLASE in the last 168 hours. No results for input(s): AMMONIA in the last 168 hours. Coagulation profile Recent Labs  Lab 06/07/19 2354  INR 1.1    CBC: Recent Labs  Lab 06/07/19 2354 06/08/19 0934 06/09/19 0531 06/10/19 0435  WBC 10.0 10.0 10.0 8.2  HGB 10.7* 10.6* 10.8* 10.8*  HCT 35.3* 35.0*  34.9* 35.2*  MCV 81.0 81.2 80.4 80.4  PLT 133* 165 156 153   Cardiac Enzymes: No results for input(s): CKTOTAL, CKMB, CKMBINDEX, TROPONINI in the last 168 hours. BNP (last 3 results) Recent Labs    01/24/19 1121 02/27/19 1106  PROBNP 52 37   CBG: No results for input(s): GLUCAP in the last 168 hours. D-Dimer: No results for input(s): DDIMER in the last 72 hours. Hgb A1c: No results for input(s): HGBA1C in the last 72 hours. Lipid Profile: No results for input(s): CHOL, HDL, LDLCALC, TRIG, CHOLHDL, LDLDIRECT in the last 72 hours. Thyroid function studies: No results for input(s): TSH, T4TOTAL, T3FREE, THYROIDAB in the last 72 hours.  Invalid  input(s): FREET3 Anemia work up: No results for input(s): VITAMINB12, FOLATE, FERRITIN, TIBC, IRON, RETICCTPCT in the last 72 hours. Sepsis Labs: Recent Labs  Lab 06/07/19 2354 06/08/19 0934 06/09/19 0531 06/10/19 0435  WBC 10.0 10.0 10.0 8.2    Microbiology Recent Results (from the past 240 hour(s))  SARS CORONAVIRUS 2 (TAT 6-24 HRS) Nasopharyngeal Nasopharyngeal Swab     Status: None   Collection Time: 06/08/19  5:33 AM   Specimen: Nasopharyngeal Swab  Result Value Ref Range Status   SARS Coronavirus 2 NEGATIVE NEGATIVE Final    Comment: (NOTE) SARS-CoV-2 target nucleic acids are NOT DETECTED. The SARS-CoV-2 RNA is generally detectable in upper and lower respiratory specimens during the acute phase of infection. Negative results do not preclude SARS-CoV-2 infection, do not rule out co-infections with other pathogens, and should not be used as the sole basis for treatment or other patient management decisions. Negative results must be combined with clinical observations, patient history, and epidemiological information. The expected result is Negative. Fact Sheet for Patients: SugarRoll.be Fact Sheet for Healthcare Providers: https://www.woods-mathews.com/ This test is not yet  approved or cleared by the [place] FDA and  has been authorized for detection and/or diagnosis of SARS-CoV-2 by FDA under an Emergency Use Authorization (EUA). This EUA will remain  in effect (meaning this test can be used) for the duration of the COVID-19 declaration under Section 56 4(b)(1) of the Act, 21 U.S.C. section 360bbb-3(b)(1), unless the authorization is terminated or revoked sooner. Performed at [facility], [address] [place], [place] [address]     Procedures and diagnostic studies:  VAS [place] LOWER EXTREMITY VENOUS (DVT)  Result Date: 06/08/2019  Lower Venous DVTStudy Indications: Edema.  Risk Factors: Confirmed PE. Limitations: Body habitus and poor ultrasound/tissue interface. Comparison Study: LEV 03-14-19, negative. Performing Technologist: [name] Crown ARDMS, RVT  Examination Guidelines: A complete evaluation includes B-mode imaging, spectral Doppler, color Doppler, and power Doppler as needed of all accessible portions of each vessel. Bilateral testing is considered an integral part of a complete examination. Limited examinations for reoccurring indications may be performed as noted. The reflux portion of the exam is performed with the patient in reverse Trendelenburg.  +---------+---------------+---------+-----------+----------+------------------+  RIGHT     Compressibility Phasicity Spontaneity Properties Thrombus Aging      +---------+---------------+---------+-----------+----------+------------------+  CFV       Full            Yes       Yes                                        +---------+---------------+---------+-----------+----------+------------------+  SFJ       Full                                                                 +---------+---------------+---------+-----------+----------+------------------+  FV Prox   Full                                                                  +---------+---------------+---------+-----------+----------+------------------+  FV Mid    Full                                                                 +---------+---------------+---------+-----------+----------+------------------+  FV Distal Full                                                                 +---------+---------------+---------+-----------+----------+------------------+  PFV       Full                                                                 +---------+---------------+---------+-----------+----------+------------------+  POP       Full            Yes       Yes                                        +---------+---------------+---------+-----------+----------+------------------+  PTV       Full                                             visualized with                                                                 color               +---------+---------------+---------+-----------+----------+------------------+  PERO                                                       Not visualized      +---------+---------------+---------+-----------+----------+------------------+   +---------+---------------+---------+-----------+----------+-------------------+  LEFT      Compressibility Phasicity Spontaneity Properties Thrombus Aging       +---------+---------------+---------+-----------+----------+-------------------+  CFV       Full            Yes       Yes                                         +---------+---------------+---------+-----------+----------+-------------------+  SFJ       Full                                                                  +---------+---------------+---------+-----------+----------+-------------------+  FV Prox   Full                                                                  +---------+---------------+---------+-----------+----------+-------------------+  FV Mid    Full                                                                   +---------+---------------+---------+-----------+----------+-------------------+  FV Distal                                                  unable to tolerate                                                               comp, visualized                                                                 with color           +---------+---------------+---------+-----------+----------+-------------------+  POP       Partial         No        No                                          +---------+---------------+---------+-----------+----------+-------------------+  PTV       Full                                             visualized with                                                                  color                +---------+---------------+---------+-----------+----------+-------------------+  PERO  Not visualized       +---------+---------------+---------+-----------+----------+-------------------+    Summary: RIGHT: - There is no evidence of deep vein thrombosis in the lower extremity. - No cystic structure found in the popliteal fossa. - Poorly visualized calf veins due to patient body habitus and swelling.  LEFT: - Findings consistent with acute deep vein thrombosis involving the left popliteal vein. - No cystic structure found in the popliteal fossa. - Poorly visualized calf veins due to patient body habitus and swelling.  *See table(s) above for measurements and observations. Electronically signed by Monica Martinez MD on 06/08/2019 at 2:02:37 PM.    Final     Medications:    buprenorphine  8 mg Sublingual BID   furosemide  20 mg Oral Q1500   furosemide  40 mg Oral Daily   gabapentin  800 mg Oral TID   lisinopril  15 mg Oral Daily   metolazone  5 mg Oral Daily   pantoprazole  40 mg Oral BID   potassium chloride  40 mEq Oral TID   pravastatin  20 mg Oral Daily   sodium chloride flush  3 mL Intravenous Once   Continuous  Infusions:  heparin 2,550 Units/hr (06/10/19 0456)     LOS: 2 days   Geradine Girt  [facility]   How to contact the [facility] Attending or Consulting provider [place] or covering provider during after hours [place], for this patient?  1. Check the care team in [facility] and look for a) attending/consulting [facility] provider listed and b) the [facility] team listed 2. Log into [email or web address] and use [place]'s universal password to access. If you do not have the password, please contact the hospital operator. 3. Locate the [facility] provider you are looking for under [facility] and page to a number that you can be directly reached. 4. If you still have difficulty reaching the provider, please page the [facility] (Director on Call) for the Hospitalists listed on amion for assistance.  06/10/2019, 11:47 AM

## 2019-06-10 NOTE — Progress Notes (Addendum)
ANTICOAGULATION CONSULT NOTE - Follow Up Consult  Pharmacy Consult for Heparin Indication: pulmonary embolus  Allergies  Allergen Reactions  . Penicillins     Did it involve swelling of the face/tongue/throat, SOB, or low BP? N/A Did it involve sudden or severe rash/hives, skin peeling, or any reaction on the inside of your mouth or nose? N/A Did you need to seek medical attention at a hospital or doctor's office?N/A When did it last happen?N/A If all above answers are "NO", may proceed with cephalosporin use.    Patient Measurements: Height: 6' 10"  (208.3 cm) Weight: (!) 373 lb 4.8 oz (169.3 kg) IBW/kg (Calculated) : 100.6 Heparin Dosing Weight: 140.7 kg  Vital Signs: Temp: 98.1 F (36.7 C) (02/27 0452) Temp Source: Oral (02/27 0452) BP: 133/77 (02/27 0452) Pulse Rate: 71 (02/27 0452)  Labs: Recent Labs    06/07/19 2354 06/07/19 2354 06/08/19 0158 06/08/19 0934 06/08/19 0934 06/08/19 1200 06/08/19 1930 06/09/19 0531 06/10/19 0435  HGB 10.7*   < >  --  10.6*   < >  --   --  10.8* 10.8*  HCT 35.3*   < >  --  35.0*  --   --   --  34.9* 35.2*  PLT 133*   < >  --  165  --   --   --  156 153  LABPROT 13.8  --   --   --   --   --   --   --   --   INR 1.1  --   --   --   --   --   --   --   --   HEPARINUNFRC  --   --   --   --   --    < > 0.30 0.39 0.52  CREATININE 1.15  --   --   --   --   --   --  1.09 1.06  TROPONINIHS 3  --  3  --   --   --   --   --   --    < > = values in this interval not displayed.    Estimated Creatinine Clearance: 129.2 mL/min (by C-G formula based on SCr of 1.06 mg/dL).  Assessment: 64 year old male presenting with chest pain found to have acute PE (no signs of r heart strain) and LLE DVT. Patient was not on anticoagulation PTA. Pharmacy consulted to dose heparin infusion.  Today, heparin level remains in therapeutic range at 0.52. CBC low but stable with pltc wnl. No bleeding or issues with infusion per discussion with RN.  Goal of  Therapy:  Heparin level 0.3-0.7 units/ml Monitor platelets by anticoagulation protocol: Yes   Plan:  Continue heparin at 2550 units/hr Monitor CBC, daily heparin level Continue to monitor for s/sx bleeding F/u transition to oral anticoagulant  **Addendum: pharmacy now consulted to start apixaban. D/c heparin now Start apixaban 10 mg twice daily for 7 days then on 06/17/19 decrease to 5 mg twice daily F/u patient education on apixaban Monitor for s/sx bleeding    Brendolyn Patty, PharmD PGY2 Pharmacy Resident Phone [phone]  06/10/2019   7:40 AM

## 2019-06-11 LAB — CBC
HCT: 34.7 % — ABNORMAL LOW (ref 39.0–52.0)
Hemoglobin: 10.5 g/dL — ABNORMAL LOW (ref 13.0–17.0)
MCH: 24.6 pg — ABNORMAL LOW (ref 26.0–34.0)
MCHC: 30.3 g/dL (ref 30.0–36.0)
MCV: 81.3 fL (ref 80.0–100.0)
Platelets: 174 10*3/uL (ref 150–400)
RBC: 4.27 MIL/uL (ref 4.22–5.81)
RDW: 16.5 % — ABNORMAL HIGH (ref 11.5–15.5)
WBC: 7.8 10*3/uL (ref 4.0–10.5)
nRBC: 0 % (ref 0.0–0.2)

## 2019-06-11 LAB — BASIC METABOLIC PANEL
Anion gap: 9 (ref 5–15)
BUN: 15 mg/dL (ref 8–23)
CO2: 32 mmol/L (ref 22–32)
Calcium: 9.2 mg/dL (ref 8.9–10.3)
Chloride: 94 mmol/L — ABNORMAL LOW (ref 98–111)
Creatinine, Ser: 1 mg/dL (ref 0.61–1.24)
GFR calc Af Amer: >60 mL/min (ref >60)
GFR calc non Af Amer: >60 mL/min (ref >60)
Glucose, Bld: 99 mg/dL (ref 70–99)
Potassium: 4.2 mmol/L (ref 3.5–5.1)
Sodium: 135 mmol/L (ref 135–145)

## 2019-06-11 MED ORDER — DOCUSATE SODIUM 100 MG PO CAPS
100.0000 mg | ORAL_CAPSULE | Freq: Two times a day (BID) | ORAL | Status: DC | PRN
  Administered 2019-06-11 (×2): 100 mg via ORAL
  Filled 2019-06-11 (×2): qty 1

## 2019-06-11 MED ORDER — FUROSEMIDE 40 MG PO TABS
40.0000 mg | ORAL_TABLET | Freq: Every day | ORAL | Status: DC
  Administered 2019-06-11 – 2019-06-12 (×2): 40 mg via ORAL
  Filled 2019-06-11 (×2): qty 1

## 2019-06-11 MED ORDER — FUROSEMIDE 80 MG PO TABS
80.0000 mg | ORAL_TABLET | Freq: Every day | ORAL | Status: DC
  Administered 2019-06-12 – 2019-06-13 (×2): 80 mg via ORAL
  Filled 2019-06-11 (×2): qty 1

## 2019-06-11 MED ORDER — METOLAZONE 5 MG PO TABS
5.0000 mg | ORAL_TABLET | ORAL | Status: DC
  Filled 2019-06-11 (×2): qty 1

## 2019-06-11 NOTE — Progress Notes (Signed)
Progress Note    Charles Gray  [id number]:[id number] DOB: 1955-12-03  DOA: 06/07/2019 PCP: Enid Skeens., MD    Brief Narrative:     Medical records reviewed and are as summarized below:  Charles Gray is an 64 y.o. male with medical history significant of hypertension, hyperlipidemia,  CHF, asthma, nonalcoholic fatty liver disease with signs of cirrhosis, herniated lumbar disks with chronic back pain, GI bleed, obesity, and GERD.  He presents with complaints of right-sided chest pain that started yesterday morning.  Describes pain as sharp with radiation to the right side of his back.  Symptoms worsen with trying to take deep breath in or coughing.   Found to have PE/DVT.  Assessment/Plan:   Principal Problem:   Pulmonary embolism (HCC) Active Problems:   Hyperlipidemia   Hypertension   Thrombocytopenia (HCC)   Cirrhosis of liver without ascites (HCC)   Chronic pain syndrome   Chronic heart failure with preserved ejection fraction (HCC)   Morbid obesity with BMI of 40.0-44.9, adult (HCC)   Normocytic anemia   Pulmonary emboli (HCC)   Acute respiratory failure -wean to room air   Pulmonary embolism/DVT: Acute.  Patient presents with complaints of right-sided chest pain with radiation to his back.  CT angiogram revealed a right-sided pulmonary embolus without significant signs of right heart strain. Found to be  febrile up to 100.4 F, but suspect secondary to pulmonary embolus.  -Change heparin drip to Eliquis: Per TOC patient has Medicaid secondary so all medication should be $4 -duplex shows DVT in left leg  Acute on chronic diastolic heart failure  -chest x-ray did not note any acute abnormalities.  Last echocardiogram from 02/2019 noted EF of 65 to 70%.   -Strict intake and output -Daily weights -Patient is not compliant with diet nor medications -Patient has diuresed 7 L here and is down about 15 pounds -Today he is complaining of dizziness and "feeling funny.   Will check orthostatics and change Zaroxolyn to every other day  Essential hypertension: Home blood pressure medications include Lasix 40 mg every morning/ 20 mg every evening, lisinopril 15 mg daily, and metolazone 5 mg daily before Lasix. -Continue current regimen - his potassium dropped sharply, will replace... Suspect due to metolazone  Normocytic anemia: Acute on chronic.  Hemoglobin 10.7 g/dL on admission, but previously noted to be 11.7 on 2/16.  Appears to be a 1 g/dL drop although patient denies any reports of blood in stool, but with previous history of GI bleed in November. -Type and screen for possible need of blood product -Check stool guaiac  -Discussed with GI Dr. Benson Norway about need for reevaluation prior to starting anticoagulation: He states no need for repeat EGD unless patient has GI bleed  Herniated disc with chronic back pain: Patient on home medications of Suboxone twice daily and gabapentin for treatment. -Continue current regimen  Hyperlipidemia: Home medications include pravastatin 20 mg nightly -Continue statin  Thrombocytopenia: Acute on chronic.  On admission platelet count 133.  Upon review of records seems to be more so chronic in nature and likely associated with history of [name].  [name]: Patient denies any history of any significant alcohol use.  -Continue to monitor  Insomnia -Continue Ambien as needed for sleep  Morbid obesity:  Estimated body mass index is 39.03 kg/m as calculated from the following:   Height as of this encounter: 6' 10"  (2.083 m).   Weight as of this encounter: 169.3 kg. -Continue to recommend weight loss  GERD/history  of GI bleed: Patient on Protonix 40 mg twice daily -Continue Protonix      Family Communication/Anticipated D/C date and plan/Code Status   DVT prophylaxis:  Eliquis Code Status: Full Code.  Family Communication:  Disposition Plan: DC in the a.m., will get meds from [facility]:     GI (phone)    Subjective:  Complaining of feeling weird and dizzy  Objective:    Vitals:   06/11/19 0500 06/11/19 0512 06/11/19 0814 06/11/19 1234  BP:  100/60 107/68 (!) 101/59  Pulse:  66 77 70  Resp:  18 18 16   Temp:  98.1 F (36.7 C) 98.5 F (36.9 C) 98.7 F (37.1 C)  TempSrc:  Oral Oral Oral  SpO2:  99% 97% 100%  Weight: (!) 169.3 kg     Height:        Intake/Output Summary (Last 24 hours) at 06/11/2019 1449 Last data filed at 06/11/2019 0947 Gross per 24 hour  Intake 1820 ml  Output 850 ml  Net 970 ml   Filed Weights   06/09/19 0453 06/10/19 0451 06/11/19 0500  Weight: (!) 173.7 kg (!) 169.3 kg (!) 169.3 kg    Exam: In bed, sleeping sitting completely upright Regular rate and rhythm No increased work of breathing, diminished breath sounds Alert and oriented x3 Rambling history and speech  Data Reviewed:   I have personally reviewed following labs and imaging studies:  Labs: Labs show the following:   Basic Metabolic Panel: Recent Labs  Lab 06/07/19 2354 06/07/19 2354 06/08/19 1024 06/09/19 0531 06/09/19 0531 06/10/19 0435 06/11/19 0450  NA 138  --   --  136  --  135 135  K 4.0   < >  --  3.7   < > 2.9* 4.2  CL 103  --   --  97*  --  92* 94*  CO2 24  --   --  28  --  33* 32  GLUCOSE 106*  --   --  103*  --  93 99  BUN 12  --   --  15  --  14 15  CREATININE 1.15  --   --  1.09  --  1.06 1.00  CALCIUM 9.0  --   --  9.0  --  9.0 9.2  MG  --   --  1.9  --   --   --   --    < > = values in this interval not displayed.   GFR Estimated Creatinine Clearance: 137 mL/min (by C-G formula based on SCr of 1 mg/dL). Liver Function Tests: No results for input(s): AST, ALT, ALKPHOS, BILITOT, PROT, ALBUMIN in the last 168 hours. No results for input(s): LIPASE, AMYLASE in the last 168 hours. No results for input(s): AMMONIA in the last 168 hours. Coagulation profile Recent Labs  Lab 06/07/19 2354  INR 1.1    CBC: Recent Labs  Lab 06/07/19  2354 06/08/19 0934 06/09/19 0531 06/10/19 0435 06/11/19 0450  WBC 10.0 10.0 10.0 8.2 7.8  HGB 10.7* 10.6* 10.8* 10.8* 10.5*  HCT 35.3* 35.0* 34.9* 35.2* 34.7*  MCV 81.0 81.2 80.4 80.4 81.3  PLT 133* 165 156 153 174   Cardiac Enzymes: No results for input(s): CKTOTAL, CKMB, CKMBINDEX, TROPONINI in the last 168 hours. BNP (last 3 results) Recent Labs    01/24/19 1121 02/27/19 1106  PROBNP 52 37   CBG: No results for input(s): GLUCAP in the last 168 hours. D-Dimer: No results  for input(s): DDIMER in the last 72 hours. Hgb A1c: No results for input(s): HGBA1C in the last 72 hours. Lipid Profile: No results for input(s): CHOL, HDL, LDLCALC, TRIG, CHOLHDL, LDLDIRECT in the last 72 hours. Thyroid function studies: No results for input(s): TSH, T4TOTAL, T3FREE, THYROIDAB in the last 72 hours.  Invalid input(s): FREET3 Anemia work up: No results for input(s): VITAMINB12, FOLATE, FERRITIN, TIBC, IRON, RETICCTPCT in the last 72 hours. Sepsis Labs: Recent Labs  Lab 06/08/19 0934 06/09/19 0531 06/10/19 0435 06/11/19 0450  WBC 10.0 10.0 8.2 7.8    Microbiology Recent Results (from the past 240 hour(s))  SARS CORONAVIRUS 2 (TAT 6-24 HRS) Nasopharyngeal Nasopharyngeal Swab     Status: None   Collection Time: 06/08/19  5:33 AM   Specimen: Nasopharyngeal Swab  Result Value Ref Range Status   SARS Coronavirus 2 NEGATIVE NEGATIVE Final    Comment: (NOTE) SARS-CoV-2 target nucleic acids are NOT DETECTED. The SARS-CoV-2 RNA is generally detectable in upper and lower respiratory specimens during the acute phase of infection. Negative results do not preclude SARS-CoV-2 infection, do not rule out co-infections with other pathogens, and should not be used as the sole basis for treatment or other patient management decisions. Negative results must be combined with clinical observations, patient history, and epidemiological information. The expected result is Negative. Fact Sheet  for Patients: SugarRoll.be Fact Sheet for Healthcare Providers: https://www.woods-mathews.com/ This test is not yet approved or cleared by the [place] FDA and  has been authorized for detection and/or diagnosis of SARS-CoV-2 by FDA under an Emergency Use Authorization (EUA). This EUA will remain  in effect (meaning this test can be used) for the duration of the COVID-19 declaration under Section 56 4(b)(1) of the Act, 21 U.S.C. section 360bbb-3(b)(1), unless the authorization is terminated or revoked sooner. Performed at [facility], [place] [address] [place], [place] [address]     Procedures and diagnostic studies:  No results found.  Medications:   . apixaban  10 mg Oral BID   Followed by  . [START ON 06/17/2019] apixaban  5 mg Oral BID  . buprenorphine  8 mg Sublingual BID  . furosemide  20 mg Oral Q1500  . furosemide  40 mg Oral Daily  . gabapentin  800 mg Oral TID  . lisinopril  15 mg Oral Daily  . [START ON 06/13/2019] metolazone  5 mg Oral QODAY  . pantoprazole  40 mg Oral BID  . potassium chloride  40 mEq Oral TID  . pravastatin  20 mg Oral Daily  . sodium chloride flush  3 mL Intravenous Once   Continuous Infusions:    LOS: 3 days   [facility]   How to contact the [facility] Attending or Consulting provider [place] or covering provider during after hours [place], for this patient?  1. Check the care team in [facility] and look for a) attending/consulting TRH provider listed and b) the [facility] team listed 2. Log into [email or web address] and use [place]'s universal password to access. If you do not have the password, please contact the hospital operator. 3. Locate the [facility] provider you are looking for under [facility] and page to a number that you can be directly reached. 4. If you still have difficulty reaching the provider, please page the [facility] (Director on Call) for the Hospitalists listed on amion for  assistance.  06/11/2019, 2:49 PM

## 2019-06-12 LAB — CBC
HCT: 36.6 % — ABNORMAL LOW (ref 39.0–52.0)
Hemoglobin: 11 g/dL — ABNORMAL LOW (ref 13.0–17.0)
MCH: 24.4 pg — ABNORMAL LOW (ref 26.0–34.0)
MCHC: 30.1 g/dL (ref 30.0–36.0)
MCV: 81.3 fL (ref 80.0–100.0)
Platelets: 207 10*3/uL (ref 150–400)
RBC: 4.5 MIL/uL (ref 4.22–5.81)
RDW: 16.5 % — ABNORMAL HIGH (ref 11.5–15.5)
WBC: 8.3 10*3/uL (ref 4.0–10.5)
nRBC: 0 % (ref 0.0–0.2)

## 2019-06-12 LAB — BASIC METABOLIC PANEL
Anion gap: 12 (ref 5–15)
BUN: 21 mg/dL (ref 8–23)
CO2: 31 mmol/L (ref 22–32)
Calcium: 9.6 mg/dL (ref 8.9–10.3)
Chloride: 92 mmol/L — ABNORMAL LOW (ref 98–111)
Creatinine, Ser: 1.03 mg/dL (ref 0.61–1.24)
GFR calc Af Amer: >60 mL/min (ref >60)
GFR calc non Af Amer: >60 mL/min (ref >60)
Glucose, Bld: 102 mg/dL — ABNORMAL HIGH (ref 70–99)
Potassium: 4 mmol/L (ref 3.5–5.1)
Sodium: 135 mmol/L (ref 135–145)

## 2019-06-12 LAB — GLUCOSE, CAPILLARY: Glucose-Capillary: 92 mg/dL (ref 70–99)

## 2019-06-12 LAB — IRON AND TIBC
Iron: 37 ug/dL — ABNORMAL LOW (ref 45–182)
Saturation Ratios: 7 % — ABNORMAL LOW (ref 17.9–39.5)
TIBC: 501 ug/dL — ABNORMAL HIGH (ref 250–450)
UIBC: 464 ug/dL

## 2019-06-12 MED ORDER — DOCUSATE SODIUM 100 MG PO CAPS: 100.0000 mg | ORAL_CAPSULE | Freq: Every day | ORAL | 0 refills | Status: DC | PRN

## 2019-06-12 MED ORDER — POTASSIUM CHLORIDE CRYS ER 20 MEQ PO TBCR
20.0000 meq | EXTENDED_RELEASE_TABLET | Freq: Two times a day (BID) | ORAL | Status: DC
  Administered 2019-06-12 – 2019-06-13 (×3): 20 meq via ORAL
  Filled 2019-06-12 (×3): qty 1

## 2019-06-12 MED ORDER — METOLAZONE 5 MG PO TABS: 5.0000 mg | ORAL_TABLET | ORAL | Status: DC

## 2019-06-12 MED ORDER — FUROSEMIDE 40 MG PO TABS: 40.0000 mg | ORAL_TABLET | Freq: Every day | ORAL | Status: DC

## 2019-06-12 MED ORDER — FUROSEMIDE 80 MG PO TABS: 80.0000 mg | ORAL_TABLET | Freq: Every day | ORAL | Status: DC

## 2019-06-12 MED ORDER — APIXABAN 5 MG PO TABS: ORAL_TABLET | ORAL | 0 refills | Status: DC

## 2019-06-12 MED ORDER — MAGNESIUM SULFATE 2 GM/50ML IV SOLN
2.0000 g | Freq: Once | INTRAVENOUS | Status: AC
  Administered 2019-06-12: 2 g via INTRAVENOUS
  Filled 2019-06-12: qty 50

## 2019-06-12 MED ORDER — CYCLOBENZAPRINE HCL 5 MG PO TABS
5.0000 mg | ORAL_TABLET | Freq: Once | ORAL | Status: AC
  Administered 2019-06-12: 03:00:00 5 mg via ORAL
  Filled 2019-06-12: qty 1

## 2019-06-12 MED FILL — ELIQUIS STARTER PACK 5 MG T: 5 | 30 days supply | Qty: 74 | Fill #0

## 2019-06-12 NOTE — Progress Notes (Signed)
Progress Note    Charles Gray  [id number]:[id number] DOB: 1955/05/12  DOA: 06/07/2019 PCP: Enid Skeens., MD    Brief Narrative:     Medical records reviewed and are as summarized below:  Charles Gray is an 64 y.o. male with medical history significant of hypertension, hyperlipidemia,  CHF, asthma, nonalcoholic fatty liver disease with signs of cirrhosis, herniated lumbar disks with chronic back pain, GI bleed, obesity, and GERD.  He presents with complaints of right-sided chest pain that started yesterday morning.  Describes pain as sharp with radiation to the right side of his back.  Symptoms worsen with trying to take deep breath in or coughing.   Found to have PE/DVT.   Assessment/Plan:   Principal Problem:   Pulmonary embolism (HCC) Active Problems:   Hyperlipidemia   Hypertension   Thrombocytopenia (HCC)   Cirrhosis of liver without ascites (HCC)   Chronic pain syndrome   Chronic heart failure with preserved ejection fraction (HCC)   Morbid obesity with BMI of 40.0-44.9, adult (HCC)   Normocytic anemia   Pulmonary emboli (HCC)   Acute respiratory failure with hypoxia -wean to room air   C/o cramping -IV MG -per patient it has stopped for now -PT recommends to keep in hospital as he is not safe for d/c  Pulmonary embolism/DVT: Acute.  Patient presents with complaints of right-sided chest pain with radiation to his back.  CT angiogram revealed a right-sided pulmonary embolus without significant signs of right heart strain. Found to be  febrile up to 100.4 F, but suspect secondary to pulmonary embolus.  -Change heparin drip to Eliquis: Per TOC patient has Medicaid secondary so all medication should be $4 -duplex shows DVT in left leg  Acute on chronic diastolic heart failure  -chest x-ray did not note any acute abnormalities.  Last echocardiogram from 02/2019 noted EF of 65 to 70%.   -Strict intake and output -Daily weights -Patient is not compliant with diet  nor medications -Patient has diuresed 7 L here and is down about 15 pounds  Essential hypertension: Home blood pressure medications include Lasix 80 mg every morning/ 40 mg every evening, lisinopril 15 mg daily, and metolazone 5 mg daily before Lasix. -Continue current regimen with metolazone changed to QOD  Normocytic anemia: Acute on chronic.  Hemoglobin 10.7 g/dL on admission, but previously noted to be 11.7 on 2/16.  Appears to be a 1 g/dL drop although patient denies any reports of blood in stool, but with previous history of GI bleed in November. -Type and screen for possible need of blood product -Check stool guaiac  -Discussed with GI Dr. Benson Norway about need for reevaluation prior to starting anticoagulation: He states no need for repeat EGD unless patient has GI bleed  Herniated disc with chronic back pain: Patient on home medications of Suboxone twice daily and gabapentin for treatment. -Continue current regimen  Hyperlipidemia: Home medications include pravastatin 20 mg nightly -Continue statin  Thrombocytopenia: Acute on chronic.  On admission platelet count 133.  Upon review of records seems to be more so chronic in nature and likely associated with history of [name].  [name]: Patient denies any history of any significant alcohol use.  -Continue to monitor  Insomnia -Continue Ambien as needed for sleep  Morbid obesity:  Estimated body mass index is 39.13 kg/m as calculated from the following:   Height as of this encounter: 6' 10"  (2.083 m).   Weight as of this encounter: 169.7 kg. -Continue to recommend weight loss  GERD/history of GI bleed: Patient on Protonix 40 mg twice daily -Continue Protonix      Family Communication/Anticipated D/C date and plan/Code Status   DVT prophylaxis:  Eliquis Code Status: Full Code.  Family Communication:  Disposition Plan: PT recommends patient continuing in the hospital as he is high fall risk with his cramping, have filled  [facility]liqu[facility] already  Medical Consultants:    GI (phone)    Subjective:   Does not complain of any further dizziness but now complaining of cramping in his hands this a.m. making it unsafe for him to walk  Objective:    Vitals:   06/12/19 0513 06/12/19 0521 06/12/19 0900 06/12/19 1213  BP: 118/74   103/65  Pulse: 72   79  Resp: 18   16  Temp: 97.8 F (36.6 C)   98.4 F (36.9 C)  TempSrc: Oral   Oral  SpO2: 100%  100% 93%  Weight:  (!) 169.7 kg    Height:        Intake/Output Summary (Last 24 hours) at 06/12/2019 1303 Last data filed at 06/12/2019 2505 Gross per 24 hour  Intake 1320 ml  Output 600 ml  Net 720 ml   Filed Weights   06/10/19 0451 06/11/19 0500 06/12/19 0521  Weight: (!) 169.3 kg (!) 169.3 kg (!) 169.7 kg    Exam: In chair, no acute distress Morbidly obese No rashes or lesions Alert and oriented, talkative Positive lower extremity edema with chronic skin changes venous stasis  Data Reviewed:   I have personally reviewed following labs and imaging studies:  Labs: Labs show the following:   Basic Metabolic Panel: Recent Labs  Lab 06/07/19 2354 06/07/19 2354 06/08/19 1024 06/09/19 0531 06/09/19 0531 06/10/19 0435 06/10/19 0435 06/11/19 0450 06/12/19 0124  NA 138  --   --  136  --  135  --  135 135  K 4.0   < >  --  3.7   < > 2.9*   < > 4.2 4.0  CL 103  --   --  97*  --  92*  --  94* 92*  CO2 24  --   --  28  --  33*  --  32 31  GLUCOSE 106*  --   --  103*  --  93  --  99 102*  BUN 12  --   --  15  --  14  --  15 21  CREATININE 1.15  --   --  1.09  --  1.06  --  1.00 1.03  CALCIUM 9.0  --   --  9.0  --  9.0  --  9.2 9.6  MG  --   --  1.9  --   --   --   --   --   --    < > = values in this interval not displayed.   GFR Estimated Creatinine Clearance: 133.1 mL/min (by C-G formula based on SCr of 1.03 mg/dL). Liver Function Tests: No results for input(s): AST, ALT, ALKPHOS, BILITOT, PROT, ALBUMIN in the last 168 hours. No  results for input(s): LIPASE, AMYLASE in the last 168 hours. No results for input(s): AMMONIA in the last 168 hours. Coagulation profile Recent Labs  Lab 06/07/19 2354  INR 1.1    CBC: Recent Labs  Lab 06/08/19 0934 06/09/19 0531 06/10/19 0435 06/11/19 0450 06/12/19 0124  WBC 10.0 10.0 8.2 7.8 8.3  HGB 10.6* 10.8* 10.8* 10.5* 11.0*  HCT 35.0* 34.9* 35.2* 34.7* 36.6*  MCV 81.2 80.4 80.4 81.3 81.3  PLT 165 156 153 174 207   Cardiac Enzymes: No results for input(s): CKTOTAL, CKMB, CKMBINDEX, TROPONINI in the last 168 hours. BNP (last 3 results) Recent Labs    01/24/19 1121 02/27/19 1106  PROBNP 52 37   CBG: Recent Labs  Lab 06/12/19 0600  GLUCAP 92   D-Dimer: No results for input(s): DDIMER in the last 72 hours. Hgb A1c: No results for input(s): HGBA1C in the last 72 hours. Lipid Profile: No results for input(s): CHOL, HDL, LDLCALC, TRIG, CHOLHDL, LDLDIRECT in the last 72 hours. Thyroid function studies: No results for input(s): TSH, T4TOTAL, T3FREE, THYROIDAB in the last 72 hours.  Invalid input(s): FREET3 Anemia work up: No results for input(s): VITAMINB12, FOLATE, FERRITIN, TIBC, IRON, RETICCTPCT in the last 72 hours. Sepsis Labs: Recent Labs  Lab 06/09/19 0531 06/10/19 0435 06/11/19 0450 06/12/19 0124  WBC 10.0 8.2 7.8 8.3    Microbiology Recent Results (from the past 240 hour(s))  SARS CORONAVIRUS 2 (TAT 6-24 HRS) Nasopharyngeal Nasopharyngeal Swab     Status: None   Collection Time: 06/08/19  5:33 AM   Specimen: Nasopharyngeal Swab  Result Value Ref Range Status   SARS Coronavirus 2 NEGATIVE NEGATIVE Final    Comment: (NOTE) SARS-CoV-2 target nucleic acids are NOT DETECTED. The SARS-CoV-2 RNA is generally detectable in upper and lower respiratory specimens during the acute phase of infection. Negative results do not preclude SARS-CoV-2 infection, do not rule out co-infections with other pathogens, and should not be used as the sole basis for  treatment or other patient management decisions. Negative results must be combined with clinical observations, patient history, and epidemiological information. The expected result is Negative. Fact Sheet for Patients: SugarRoll.be Fact Sheet for Healthcare Providers: https://www.woods-mathews.com/ This test is not yet approved or cleared by the [place] FDA and  has been authorized for detection and/or diagnosis of SARS-CoV-2 by FDA under an Emergency Use Authorization (EUA). This EUA will remain  in effect (meaning this test can be used) for the duration of the COVID-19 declaration under Section 56 4(b)(1) of the Act, 21 U.S.C. section 360bbb-3(b)(1), unless the authorization is terminated or revoked sooner. Performed at [facility], [place] [address] [place], [place] [address]     Procedures and diagnostic studies:  No results found.  Medications:   . apixaban  10 mg Oral BID   Followed by  . [START ON 06/17/2019] apixaban  5 mg Oral BID  . buprenorphine  8 mg Sublingual BID  . furosemide  40 mg Oral QHS  . furosemide  80 mg Oral Daily  . gabapentin  800 mg Oral TID  . lisinopril  15 mg Oral Daily  . [START ON 06/13/2019] metolazone  5 mg Oral QODAY  . pantoprazole  40 mg Oral BID  . potassium chloride  20 mEq Oral BID  . pravastatin  20 mg Oral Daily  . sodium chloride flush  3 mL Intravenous Once   Continuous Infusions: . magnesium sulfate bolus IVPB 2 g (06/12/19 1239)     LOS: 4 days   Geradine Girt  [facility]   How to contact the [facility] Attending or Consulting provider [place] or covering provider during after hours [place], for this patient?  1. Check the care team in [facility] and look for a) attending/consulting TRH provider listed and b) the [facility] team listed 2. Log into [email or web address] and use  [place]'s universal password to access. If you do not have the password, please contact the hospital operator. 3.  Locate the [facility] provider you are looking for under [facility] and page to a number that you can be directly reached. 4. If you still have difficulty reaching the provider, please page the [facility] (Director on Call) for the Hospitalists listed on amion for assistance.  06/12/2019, 1:03 PM

## 2019-06-12 NOTE — Progress Notes (Signed)
Occupational Therapy Treatment Patient Details Name: Charles Gray MRN: [id number] DOB: [birth year] Today's Date: 06/12/2019    History of present illness Pt is a 64 y/o male admitted secondary to worsening chest pain and SOB. Found to have PE and LLE DVT. PMH includes HTN, CHF, GI bleed, and chronic back pain.    OT comments  Upon arrival patient sleeping upright at edge of bed, easy to arouse. Patient reports not getting much sleep last night. Patient participate in seated grooming/hygiene tasks with set up assist. Min guard for steadying assist and safety for  transfer to recliner. Patient having increased difficulty with mobility due to pain from cramping. Patient describes cramps in his hand, legs, shoulder. RN notified.   Follow Up Recommendations  Home health OT    Equipment Recommendations  Tub/shower bench;Other (comment)(Adaptive equipment LB ADLs)       Precautions / Restrictions Precautions Precautions: Fall Restrictions Weight Bearing Restrictions: No       Mobility Bed Mobility               General bed mobility comments: patient seated at side of bed upon arrival  Transfers Overall transfer level: Needs assistance Equipment used: None Transfers: Sit to/from Stand Sit to Stand: Min guard         General transfer comment: min guard for steadying assist    Balance Overall balance assessment: Needs assistance Sitting-balance support: No upper extremity supported;Feet supported Sitting balance-Leahy Scale: Good     Standing balance support: No upper extremity supported Standing balance-Leahy Scale: Fair Standing balance comment: min guard for safety                           ADL either performed or assessed with clinical judgement   ADL Overall ADL's : Needs assistance/impaired     Grooming: Wash/dry face;Wash/dry hands;Oral care;Sitting;Set up                   Toilet Transfer: Min Art therapist Details  (indicate cue type and reason): min guard for steadying assist as patient reports cramping throughout his body to transfer to recliner         Functional mobility during ADLs: Min guard General ADL Comments: patient having increased difficulty with mobility due to cramps which patient describes in his hand, legs, shoulder. recommend walker for further mobility, patient verbalize understanding.               Cognition Arousal/Alertness: Awake/alert Behavior During Therapy: WFL for tasks assessed/performed Overall Cognitive Status: Within Functional Limits for tasks assessed                                                     Pertinent Vitals/ Pain       Pain Assessment: Faces Faces Pain Scale: Hurts even more Pain Location: LE's, shoulders Pain Descriptors / Indicators: Cramping Pain Intervention(s): Monitored during session;Other (comment)(notified RN)         Frequency  Min 2X/week        Progress Toward Goals  OT Goals(current goals can now be found in the care plan section)  Progress towards OT goals: Progressing toward goals  Acute Rehab OT Goals Patient Stated Goal: Stop muscle cramping OT Goal Formulation: With patient Time For Goal Achievement: 06/26/19 Potential to Achieve Goals:  Good  Plan Discharge plan remains appropriate       AM-PAC OT "6 Clicks" Daily Activity     Outcome Measure   Help from another person eating meals?: None Help from another person taking care of personal grooming?: A Little Help from another person toileting, which includes using toliet, bedpan, or urinal?: A Little Help from another person bathing (including washing, rinsing, drying)?: A Little Help from another person to put on and taking off regular upper body clothing?: None Help from another person to put on and taking off regular lower body clothing?: A Little 6 Click Score: 20    End of Session  OT Visit Diagnosis: Unsteadiness on feet  (R26.81);Other abnormalities of gait and mobility (R26.89);Muscle weakness (generalized) (M62.81);Pain Pain - part of body: (legs, shoulder, hand)   Activity Tolerance Patient limited by pain   Patient Left in chair;with call bell/phone within reach   Nurse Communication Other (comment)(patient report of cramps)        Time: 5747-3403 OT Time Calculation (min): 17 min  Charges: OT General Charges $OT Visit: 1 Visit OT Treatments $Self Care/Home Management : 8-22 mins  Shon Millet OT OT office: [place] 06/12/2019, 9:44 AM

## 2019-06-12 NOTE — Progress Notes (Signed)
Physical Therapy Treatment Patient Details Name: Charles Gray MRN: [id number] DOB: 1955/09/09 Today's Date: 06/12/2019    History of Present Illness Pt is a 64 y/o male admitted secondary to worsening chest pain and SOB. Found to have PE and LLE DVT. PMH includes HTN, CHF, GI bleed, and chronic back pain.     PT Comments    Pt progressing slowly towards physical therapy goals. Muscle cramps were the main limiting factor during session today. The plan was to initiate stair training as pt has 16 stairs to enter his apartment, however pt was not able to tolerate the distance to the stairwell. He reports cramping in shoulders and LE's, and noted hands falling off the walker several times after pt reports onset of cramping in shoulders. He is at an increased risk for falls at this time, and feel another day in the hospital would be beneficial to progress mobility, improve tolerance for functional activity, and hopefully improve safety as well as he will not have consistent assist upon d/c. Will continue to follow.    Follow Up Recommendations  Home health PT;Supervision for mobility/OOB     Equipment Recommendations  None recommended by PT    Recommendations for Other Services       Precautions / Restrictions Precautions Precautions: Fall Restrictions Weight Bearing Restrictions: No    Mobility  Bed Mobility               General bed mobility comments: Pt was recevied sitting up in recliner chair with feet propped up on an arm chair  Transfers Overall transfer level: Needs assistance Equipment used: Rolling walker (2 wheeled) Transfers: Sit to/from Stand Sit to Stand: Supervision         General transfer comment: Supervision for safety. No assist required. Stood without RW but required UE support on walker before initiating ambulation.   Ambulation/Gait Ambulation/Gait assistance: Min guard Gait Distance (Feet): 75 Feet Assistive device: Rolling walker (2  wheeled) Gait Pattern/deviations: Step-through pattern;Decreased stride length;Wide base of support;Trunk flexed Gait velocity: Decreased Gait velocity interpretation: <1.8 ft/sec, indicate of risk for recurrent falls General Gait Details: Slow and guarded. Plan was to ambulate to the stairwell to initiate stair training, however pt's shoudlers and LE's reportedly began cramping and we were not able to make it there. Noted pt's hands falling off the walker with cramping and frequent standing rest breaks taken throughout OOB mobility.    Stairs             Wheelchair Mobility    Modified Rankin (Stroke Patients Only)       Balance Overall balance assessment: Needs assistance Sitting-balance support: No upper extremity supported;Feet supported Sitting balance-Leahy Scale: Fair     Standing balance support: Bilateral upper extremity supported;During functional activity Standing balance-Leahy Scale: Poor Standing balance comment: Reliant on BUE support                             Cognition Arousal/Alertness: Awake/alert Behavior During Therapy: WFL for tasks assessed/performed Overall Cognitive Status: Within Functional Limits for tasks assessed                                        Exercises      General Comments        Pertinent Vitals/Pain Pain Assessment: Faces Faces Pain Scale: Hurts even more Pain Location: LE's,  shoulders Pain Descriptors / Indicators: Cramping Pain Intervention(s): Limited activity within patient's tolerance;Monitored during session;Repositioned    Home Living                      Prior Function            PT Goals (current goals can now be found in the care plan section) Acute Rehab PT Goals Patient Stated Goal: Stop muscle cramping PT Goal Formulation: With patient Time For Goal Achievement: 06/23/19 Potential to Achieve Goals: Good Progress towards PT goals: Progressing toward goals     Frequency    Min 3X/week      PT Plan Current plan remains appropriate    Co-evaluation              AM-PAC PT "6 Clicks" Mobility   Outcome Measure  Help needed turning from your back to your side while in a flat bed without using bedrails?: None Help needed moving from lying on your back to sitting on the side of a flat bed without using bedrails?: None Help needed moving to and from a bed to a chair (including a wheelchair)?: None Help needed standing up from a chair using your arms (e.g., wheelchair or bedside chair)?: None Help needed to walk in hospital room?: A Little Help needed climbing 3-5 steps with a railing? : A Little 6 Click Score: 22    End of Session Equipment Utilized During Treatment: Gait belt Activity Tolerance: Patient tolerated treatment well Patient left: in chair;with call bell/phone within reach Nurse Communication: Mobility status PT Visit Diagnosis: Muscle weakness (generalized) (M62.81)     Time: 8850-2774 PT Time Calculation (min) (ACUTE ONLY): 21 min  Charges:  $Gait Training: 8-22 mins                     Rolinda Roan, PT, DPT Acute Rehabilitation Services Pager: [phone] Office: [phone]    Charles Gray 06/12/2019, 9:17 AM

## 2019-06-12 NOTE — Progress Notes (Signed)
Pt c/o muscle cramps, Md notified, new order for flexeril 72m once, med given, will cont to monitor.

## 2019-06-13 MED ORDER — MAGNESIUM CITRATE PO SOLN
1.0000 | Freq: Once | ORAL | Status: AC
  Administered 2019-06-13: 1 via ORAL
  Filled 2019-06-13: qty 296

## 2019-06-13 MED ORDER — SODIUM CHLORIDE 0.9 % IV SOLN
510.0000 mg | Freq: Once | INTRAVENOUS | Status: DC
  Filled 2019-06-13: qty 17

## 2019-06-13 NOTE — TOC Benefit Eligibility Note (Signed)
Transition of Care [facility]) Benefit Eligibility Note    Patient Details  Name: Saban Heinlen MRN: [id number] Date of Birth: 02-09-1956   Medication/Dose: Arne Cleveland  10 MG DAILY   and  5 MG BID                       Additional Notes: PATIENT HAS MEDICAID  OF [place] , EFF-DATE: 10-12-2018 , CO-PAY $ 3.90 FOR EACH PRESCRIPTION    Memory Argue Phone Number: 06/13/2019, 11:51 AM

## 2019-06-13 NOTE — Discharge Summary (Signed)
Physician Discharge Summary  Dartanion Teo DEY:[id number] DOB: Apr 06, 1956 DOA: 06/07/2019  PCP: Enid Skeens., MD  Admit date: 06/07/2019 Discharge date: 06/13/2019  Admitted From: Home Discharge disposition: Home   Recommendations for Outpatient Follow-Up:   Resume home health Eliquis started for new diagnosis of PE DVT Patient instructed to be compliant with fluid restriction as well as daily weights  Discharge Diagnosis:   Principal Problem:   Pulmonary embolism ([place]) Active Problems:   Hyperlipidemia   Hypertension   Thrombocytopenia ([place])   Cirrhosis of liver without ascites ([facility])   Chronic pain syndrome   Chronic heart failure with preserved ejection fraction ([place])   Morbid obesity with BMI of 40.0-44.9, adult ([place])   Normocytic anemia   Pulmonary emboli ([place])    Discharge Condition: Improved.  Diet recommendation: Low sodium, heart healthy  Wound care: None.  Code status: Full.   History of Present Illness:    Charles Gray is a 64 y.o. male with medical history significant of hypertension, hyperlipidemia,  CHF, asthma, nonalcoholic fatty liver disease with signs of cirrhosis, herniated lumbar disks with chronic back pain, GI bleed, obesity, and GERD.  He presents with complaints of right-sided chest pain that started yesterday morning.  Describes pain as sharp with radiation to the right side of his back.  Symptoms worsen with trying to take deep breath in or coughing.   Associated symptoms include bilateral leg swelling, shortness of breath, and some right upper quadrant abdominal discomfort.  Denies any recent fever, chills, nausea, vomiting, diarrhea, NSAID use, or blood in stools recently.  He does not drink alcohol or smoke.  At baseline patient ambulates with use of a walker and states that he has been more sedimentary than usual.  He is on Suboxone for previous history of chronic opioid use due to chronic pain.  He had just recently had a  prolonged hospitalization in November 2020 with GI bleed requiring 3 units of PRBCs.  GI performed EGD which noted a small gastric ulcer, but source was not clearly determined.  ED Course: Upon admission into the emergency department patient was noted to be febrile up to 100.4 F, respirations 11-25, and all other vital signs maintained.  Labs significant for hemoglobin 10.7 and platelets 133.  CT angiogram of the chest noted an acute pulmonary embolus of the right lower lobe with no significant signs of right heart strain, small right pleural effusion, and signs of cirrhosis.  COVID-19 screening was still pending.  Patient was started on a heparin drip.  TRH called to admit.    Hospital Course by Problem:   Acute respiratory failure with hypoxia -weaned to room air   Pulmonary embolism/DVT: Acute. Patient presents withcomplaints of right-sided chest pain with radiation to his back. CT angiogram revealed a right-sided pulmonary embolus without significant signs of right heart strain. Found to be febrile up to 100.4 F, but suspect secondary to pulmonary embolus.  -Change heparin drip to Eliquis: Per TOC patient has Medicaid secondary so all medication should be $4 -duplex shows DVT in left leg  Acute on chronic diastolic heart failure  -chest x-ray did not note any acute abnormalities. Last echocardiogram from 02/2019 noted EF of 65 to 70%. -Strict intake and output -Daily weights -Patient is not compliant with diet nor medications-encouraged fluid restriction  Essential hypertension: Home blood pressure medications include Lasix80 mg every morning/40 mg every evening, lisinopril42m daily, and metolazone 5 mg daily before Lasix. -Continue current regimen with metolazone changed  to QOD  Iron deficiency anemia:Acute on chronic. Hemoglobin 10.7 g/dLon admission, but previously noted to be 11.7 on 2/16. Appears to be a 1 g/dL drop although patient denies any reports of blood  in stool, but with previous history of GI bleed in November. Transfuse 1 dose of IV iron -Discussed with GI Dr. Benson [place] about need for reevaluation prior to starting anticoagulation: He states no need for repeat EGD unless patient has GI bleed  Herniated disc with chronic back pain: Patient on home medications of Suboxone twice daily and gabapentin for treatment. -Continue current regimen  Hyperlipidemia: Home medications include pravastatin 20 mg nightly -Continue statin  Thrombocytopenia: Chronic and stable  [name]: Patient denies any history of any significant alcohol use.  -Continue to monitor  Morbid obesity:  Estimated body mass index is 39.13 kg/m as calculated from the following:   Height as of this encounter: 6' 10"  (2.083 m).   Weight as of this encounter: 169.7 kg. -Continue to recommend weight loss  GERD/history of GI bleed: Patient on Protonix 40 mg twice daily -Continue Protonix      Medical Consultants:      Discharge Exam:   Vitals:   06/12/19 2355 06/13/19 0616  BP: 92/62 110/64  Pulse: 80 72  Resp: 16 16  Temp: 98.2 F (36.8 C) 97.9 F (36.6 C)  SpO2: 97% 97%   Vitals:   06/12/19 1213 06/12/19 1737 06/12/19 2355 06/13/19 0616  BP: 103/65 92/62 92/62  110/64  Pulse: 79 77 80 72  Resp: 16 16 16 16   Temp: 98.4 F (36.9 C) 98 F (36.7 C) 98.2 F (36.8 C) 97.9 F (36.6 C)  TempSrc: Oral Oral Oral Oral  SpO2: 93% 91% 97% 97%  Weight:      Height:        General exam: Appears calm and comfortable.    The results of significant diagnostics from this hospitalization (including imaging, microbiology, ancillary and laboratory) are listed below for reference.     Procedures and Diagnostic Studies:   DG Chest 2 View  Result Date: 06/08/2019 CLINICAL DATA:  Central chest pain since this morning, hypertension EXAM: CHEST - 2 VIEW COMPARISON:  03/05/2019 FINDINGS: Frontal and lateral views of the chest demonstrate a stable cardiac  silhouette. Chronic bibasilar scarring without airspace disease, effusion, or pneumothorax. No acute bony abnormalities. IMPRESSION: 1. No acute intrathoracic process. Electronically Signed   By: Randa Ngo M.D.   On: 06/08/2019 00:27   CT Angio Chest PE W and/or Wo Contrast  Result Date: 06/08/2019 CLINICAL DATA:  Shortness of breath, centralized chest pain which began last night EXAM: CT ANGIOGRAPHY CHEST WITH CONTRAST TECHNIQUE: Multidetector CT imaging of the chest was performed using the standard protocol during bolus administration of intravenous contrast. Multiplanar CT image reconstructions and MIPs were obtained to evaluate the vascular anatomy. CONTRAST:  6m OMNIPAQUE IOHEXOL 350 MG/ML SOLN COMPARISON:  CT 03/02/2019 FINDINGS: Cardiovascular: Satisfactory opacification of the pulmonary arteries to the segmental level. Hypoattenuating filling defect is seen in the right lower lobar pulmonary artery extending into the posterior segmental and subsegmental branches. No other pulmonary artery filling defects are identified. No elevation of the RV/LV ratio (0.75). Normal heart size. No pericardial effusion. The aorta is normal caliber. Normal 3 vessel branching of the arch. Mediastinum/Nodes: No mediastinal fluid or gas. Normal thyroid gland and thoracic inlet. No acute abnormality of the trachea or esophagus. Scattered subcentimeter low-attenuation shotty mediastinal adenopathy is similar to prior. No worrisome mediastinal, hilar or axillary adenopathy.  Lungs/Pleura: Small right pleural effusion. Dependent atelectasis in the right lung base. No consolidation, features of edema, pneumothorax, or effusion. No suspicious pulmonary nodules or masses. Upper Abdomen: No acute abnormalities present in the visualized portions of the upper abdomen. Diffusely nodular hepatic surface contour. No visible hepatic lesions. Small accessory splenule Musculoskeletal: Multiple contiguous remote second through eighth  anterolateral right rib deformities. Multilevel degenerative changes are present in the imaged portions of the spine. No acute osseous abnormality or suspicious osseous lesion. No suspicious chest wall lesions. Mild bilateral gynecomastia. Review of the MIP images confirms the above findings. IMPRESSION: 1. Acute pulmonary embolus involving the right lower lobar pulmonary artery extending into the posterior segmental and subsegmental branches. No additional pulmonary emboli are identified. No evidence of right heart strain. 2. Small right pleural effusion. 3. Nodular hepatic surface contour suggestive of cirrhosis. These results were called by telephone at the time of interpretation on 06/08/2019 at 4:32 am to provider Veryl Speak , who verbally acknowledged these results. Electronically Signed   By: Lovena Le M.D.   On: 06/08/2019 04:32   VAS [place] LOWER EXTREMITY VENOUS (DVT)  Result Date: 06/08/2019  Lower Venous DVTStudy Indications: Edema.  Risk Factors: Confirmed PE. Limitations: Body habitus and poor ultrasound/tissue interface. Comparison Study: LEV 03-14-19, negative. Performing Technologist: [name] Crown ARDMS, RVT  Examination Guidelines: A complete evaluation includes B-mode imaging, spectral Doppler, color Doppler, and power Doppler as needed of all accessible portions of each vessel. Bilateral testing is considered an integral part of a complete examination. Limited examinations for reoccurring indications may be performed as noted. The reflux portion of the exam is performed with the patient in reverse Trendelenburg.  +---------+---------------+---------+-----------+----------+------------------+ RIGHT    CompressibilityPhasicitySpontaneityPropertiesThrombus Aging     +---------+---------------+---------+-----------+----------+------------------+ CFV      Full           Yes      Yes                                      +---------+---------------+---------+-----------+----------+------------------+ SFJ      Full                                                            +---------+---------------+---------+-----------+----------+------------------+ FV Prox  Full                                                            +---------+---------------+---------+-----------+----------+------------------+ FV Mid   Full                                                            +---------+---------------+---------+-----------+----------+------------------+ FV DistalFull                                                            +---------+---------------+---------+-----------+----------+------------------+  PFV      Full                                                            +---------+---------------+---------+-----------+----------+------------------+ POP      Full           Yes      Yes                                     +---------+---------------+---------+-----------+----------+------------------+ PTV      Full                                         visualized with                                                          color              +---------+---------------+---------+-----------+----------+------------------+ PERO                                                  Not visualized     +---------+---------------+---------+-----------+----------+------------------+   +---------+---------------+---------+-----------+----------+-------------------+ LEFT     CompressibilityPhasicitySpontaneityPropertiesThrombus Aging      +---------+---------------+---------+-----------+----------+-------------------+ CFV      Full           Yes      Yes                                      +---------+---------------+---------+-----------+----------+-------------------+ SFJ      Full                                                              +---------+---------------+---------+-----------+----------+-------------------+ FV Prox  Full                                                             +---------+---------------+---------+-----------+----------+-------------------+ FV Mid   Full                                                             +---------+---------------+---------+-----------+----------+-------------------+ FV Distal  unable to tolerate                                                        comp, visualized                                                          with color          +---------+---------------+---------+-----------+----------+-------------------+ POP      Partial        No       No                                       +---------+---------------+---------+-----------+----------+-------------------+ PTV      Full                                         visualized with                                                           color               +---------+---------------+---------+-----------+----------+-------------------+ PERO                                                  Not visualized      +---------+---------------+---------+-----------+----------+-------------------+    Summary: RIGHT: - There is no evidence of deep vein thrombosis in the lower extremity. - No cystic structure found in the popliteal fossa. - Poorly visualized calf veins due to patient body habitus and swelling.  LEFT: - Findings consistent with acute deep vein thrombosis involving the left popliteal vein. - No cystic structure found in the popliteal fossa. - Poorly visualized calf veins due to patient body habitus and swelling.  *See table(s) above for measurements and observations. Electronically signed by Monica Martinez MD on 06/08/2019 at 2:02:37 PM.    Final    ECHOCARDIOGRAM LIMITED  Result Date: 06/08/2019    ECHOCARDIOGRAM  LIMITED REPORT   Patient Name:   Charles Gray Date of Exam: 06/08/2019 Medical Rec #:  [id number]       Height:       82.0 in Accession #:    [id number]      Weight:       387.0 lb Date of Birth:  [birth year]       BSA:          3.099 m Patient Age:    47 years        BP:           115/70 mmHg Patient Gender: M  HR:           77 bpm. Exam Location:  Inpatient Procedure: Limited Echo, Color Doppler and Intracardiac Opacification Agent Indications:    Pulmonary Embolus  History:        Patient has prior history of Echocardiogram examinations, most                 recent 03/14/2019. CHF, Signs/Symptoms:Chest Pain; Risk                 Factors:Hypertension, Dyslipidemia and Morbid obesity. Pulmonary                 embolus.  Sonographer:    Dustin Flock Referring Phys: [id number] RONDELL A SMITH  Sonographer Comments: Technically difficult study due to poor echo windows and patient is morbidly obese. Image acquisition challenging due to patient body habitus and too painful. IMPRESSIONS  1. Left ventricular ejection fraction, by estimation, is 55 to 60%. The left ventricle has normal function. The left ventricle has no regional wall motion abnormalities. Left ventricular diastolic function could not be evaluated.  2. Right ventricular systolic function was not well visualized. The right ventricular size is normal. Tricuspid regurgitation signal is inadequate for assessing PA pressure.  3. The mitral valve was not well visualized.  4. The aortic valve was not well visualized. Aortic valve regurgitation is not visualized. Mild aortic valve sclerosis is present, with no evidence of aortic valve stenosis. FINDINGS  Left Ventricle: Left ventricular ejection fraction, by estimation, is 55 to 60%. The left ventricle has normal function. The left ventricle has no regional wall motion abnormalities. Definity contrast agent was given IV to delineate the left ventricular  endocardial borders. The left ventricular  internal cavity size was normal in size. There is no left ventricular hypertrophy. Right Ventricle: The right ventricular size is normal. Right vetricular wall thickness was not assessed. Right ventricular systolic function was not well visualized. Tricuspid regurgitation signal is inadequate for assessing PA pressure. Left Atrium: Left atrial size was not well visualized. Right Atrium: Right atrial size was not well visualized. Pericardium: There is no evidence of pericardial effusion. Mitral Valve: The mitral valve was not well visualized. Normal mobility of the mitral valve leaflets. Tricuspid Valve: The tricuspid valve is not well visualized. No evidence of tricuspid stenosis. Aortic Valve: The aortic valve was not well visualized. Aortic valve regurgitation is not visualized. Mild aortic valve sclerosis is present, with no evidence of aortic valve stenosis. Pulmonic Valve: The pulmonic valve was not well visualized. Pulmonic valve regurgitation is not visualized. Aorta: Aortic root could not be assessed and the aortic root was not well visualized. Venous: The inferior vena cava was not well visualized. IAS/Shunts: No atrial level shunt detected by color flow Doppler.  LEFT VENTRICLE PLAX 2D LVIDd:         5.40 cm LVIDs:         4.10 cm LV PW:         1.40 cm LV IVS:        1.30 cm LVOT diam:     2.40 cm LVOT Area:     4.52 cm  LEFT ATRIUM         Index LA diam:    4.10 cm 1.32 cm/m   AORTA Ao Root diam: 3.30 cm  SHUNTS Systemic Diam: 2.40 cm Fransico Him MD Electronically signed by Fransico Him MD Signature Date/Time: 06/08/2019/11:10:34 AM    Final      Labs:   Basic  Metabolic Panel: Recent Labs  Lab 06/07/19 2354 06/07/19 2354 06/08/19 1024 06/09/19 0531 06/09/19 0531 06/10/19 0435 06/10/19 0435 06/11/19 0450 06/12/19 0124  NA 138  --   --  136  --  135  --  135 135  K 4.0   < >  --  3.7   < > 2.9*   < > 4.2 4.0  CL 103  --   --  97*  --  92*  --  94* 92*  CO2 24  --   --  28  --  33*   --  32 31  GLUCOSE 106*  --   --  103*  --  93  --  99 102*  BUN 12  --   --  15  --  14  --  15 21  CREATININE 1.15  --   --  1.09  --  1.06  --  1.00 1.03  CALCIUM 9.0  --   --  9.0  --  9.0  --  9.2 9.6  MG  --   --  1.9  --   --   --   --   --   --    < > = values in this interval not displayed.   GFR Estimated Creatinine Clearance: 133.1 mL/min (by C-G formula based on SCr of 1.03 mg/dL). Liver Function Tests: No results for input(s): AST, ALT, ALKPHOS, BILITOT, PROT, ALBUMIN in the last 168 hours. No results for input(s): LIPASE, AMYLASE in the last 168 hours. No results for input(s): AMMONIA in the last 168 hours. Coagulation profile Recent Labs  Lab 06/07/19 2354  INR 1.1    CBC: Recent Labs  Lab 06/08/19 0934 06/09/19 0531 06/10/19 0435 06/11/19 0450 06/12/19 0124  WBC 10.0 10.0 8.2 7.8 8.3  HGB 10.6* 10.8* 10.8* 10.5* 11.0*  HCT 35.0* 34.9* 35.2* 34.7* 36.6*  MCV 81.2 80.4 80.4 81.3 81.3  PLT 165 156 153 174 207   Cardiac Enzymes: No results for input(s): CKTOTAL, CKMB, CKMBINDEX, TROPONINI in the last 168 hours. BNP: Invalid input(s): POCBNP CBG: Recent Labs  Lab 06/12/19 0600  GLUCAP 92   D-Dimer No results for input(s): DDIMER in the last 72 hours. Hgb A1c No results for input(s): HGBA1C in the last 72 hours. Lipid Profile No results for input(s): CHOL, HDL, LDLCALC, TRIG, CHOLHDL, LDLDIRECT in the last 72 hours. Thyroid function studies No results for input(s): TSH, T4TOTAL, T3FREE, THYROIDAB in the last 72 hours.  Invalid input(s): FREET3 Anemia work up Recent Labs    06/12/19 1212  TIBC 501*  IRON 37*   Microbiology Recent Results (from the past 240 hour(s))  SARS CORONAVIRUS 2 (TAT 6-24 HRS) Nasopharyngeal Nasopharyngeal Swab     Status: None   Collection Time: 06/08/19  5:33 AM   Specimen: Nasopharyngeal Swab  Result Value Ref Range Status   SARS Coronavirus 2 NEGATIVE NEGATIVE Final    Comment: (NOTE) SARS-CoV-2 target nucleic  acids are NOT DETECTED. The SARS-CoV-2 RNA is generally detectable in upper and lower respiratory specimens during the acute phase of infection. Negative results do not preclude SARS-CoV-2 infection, do not rule out co-infections with other pathogens, and should not be used as the sole basis for treatment or other patient management decisions. Negative results must be combined with clinical observations, patient history, and epidemiological information. The expected result is Negative. Fact Sheet for Patients: SugarRoll.be Fact Sheet for Healthcare Providers: https://www.woods-mathews.com/ This test is not yet approved or cleared by  the [employer] and  has been authorized for detection and/or diagnosis of SARS-CoV-2 by FDA under an Emergency Use Authorization (EUA). This EUA will remain  in effect (meaning this test can be used) for the duration of the COVID-19 declaration under Section 56 4(b)(1) of the Act, 21 U.S.C. section 360bbb-3(b)(1), unless the authorization is terminated or revoked sooner. Performed at [facility], [place] [address], [place], [place] [address]      Discharge Instructions:   Discharge Instructions    (HEART FAILURE PATIENTS) Call MD:  Anytime you have any of the following symptoms: 1) 3 pound weight gain in 24 hours or 5 pounds in 1 week 2) shortness of breath, with or without a dry hacking cough 3) swelling in the hands, feet or stomach 4) if you have to sleep on extra pillows at night in order to breathe.   Complete by: As directed    Diet - low sodium heart healthy   Complete by: As directed    Increase activity slowly   Complete by: As directed      Allergies as of 06/13/2019      Reactions   Penicillins    Did it involve swelling of the face/tongue/throat, SOB, or low BP? N/A Did it involve sudden or severe rash/hives, skin peeling, or any reaction on the inside of your mouth or nose? N/A Did you  need to seek medical attention at a hospital or doctor's office?N/A When did it last happen?N/A If all above answers are "NO", may proceed with cephalosporin use.      Medication List    TAKE these medications   acetaminophen 325 MG tablet Commonly known as: TYLENOL Take 2 tablets (650 mg total) by mouth every 4 (four) hours as needed for fever or mild pain.   apixaban 5 MG Tabs tablet Commonly known as: Eliquis Take 2 tablets (56m) twice daily for 7 days, then 1 tablet (560m twice daily   Buprenorphine HCl-Naloxone HCl 8-2 MG Film Place 1 tablet under the tongue in the morning and at bedtime.   docusate sodium 100 MG capsule Commonly known as: COLACE Take 1 capsule (100 mg total) by mouth daily as needed for mild constipation.   furosemide 80 MG tablet Commonly known as: LASIX Take 1 tablet (80 mg total) by mouth daily. What changed:   medication strength  how much to take  how to take this  when to take this  additional instructions   furosemide 40 MG tablet Commonly known as: LASIX Take 1 tablet (40 mg total) by mouth at bedtime. What changed: You were already taking a medication with the same name, and this prescription was added. Make sure you understand how and when to take each.   gabapentin 800 MG tablet Commonly known as: NEURONTIN Take 1 tablet (800 mg total) by mouth 3 (three) times daily.   lisinopril 5 MG tablet Commonly known as: ZESTRIL Take 3 tablets (15 mg total) by mouth daily.   metolazone 5 MG tablet Commonly known as: ZAROXOLYN Take 1 tablet (5 mg total) by mouth every other day. What changed:   when to take this  additional instructions   pantoprazole 40 MG tablet Commonly known as: PROTONIX Take 40 mg by mouth 2 (two) times daily.   polyethylene glycol 17 g packet Commonly known as: MIRALAX / GLYCOLAX Take 17 g by mouth daily. What changed:   when to take this  reasons to take this   potassium chloride 10  MEQ  tablet Commonly known as: KLOR-CON Take 2 tablets (20 mEq total) by mouth 2 (two) times daily.   pravastatin 20 MG tablet Commonly known as: PRAVACHOL Take 1 tablet (20 mg total) by mouth daily.   Ventolin HFA 108 (90 Base) MCG/ACT inhaler Generic drug: albuterol Inhale 1 puff into the lungs every 4 (four) hours as needed for shortness of breath.   zolpidem 10 MG tablet Commonly known as: AMBIEN Take 10 mg by mouth at bedtime as needed for sleep.      Follow-up Information    [facility].   Specialty: [facility]       [place], Marshall Cork., MD Follow up in 1 week(s).   Specialty: Family Medicine Why: bmp 1 week Contact information: [address] [place] [address] [phone]        Berniece Salines, DO .   Specialty: Cardiology Contact information: [place] [address] [phone]            Time coordinating discharge: 35 minutes  Signed:  Geradine Girt DO  [facility] 06/13/2019, 11:43 AM

## 2019-06-13 NOTE — TOC Transition Note (Signed)
Transition of Care [facility]) - CM/SW Discharge Note   Patient Details  Name: Ilias Stcharles MRN: [id number] Date of Birth: Jan 14, 1956  Transition of Care [facility]) CM/SW Contact:  Marilu Favre, RN Phone Number: 06/13/2019, 1:17 PM   Clinical Narrative:     Advanced home health aware discharge is today    Final next level of care: Home/Self Care Barriers to Discharge: Continued Medical Work up   Patient Goals and CMS Choice Patient states their goals for this hospitalization and ongoing recovery are:: to go home CMS Medicare.gov Compare Post Acute Care list provided to:: Patient    Discharge Placement                       Discharge Plan and Services   Discharge Planning Services: CM Consult, Medication Assistance            DME Arranged: N/A         HH Arranged: NA          Social Determinants of Health (SDOH) Interventions     Readmission Risk Interventions No flowsheet data found.

## 2019-06-13 NOTE — Progress Notes (Signed)
Discharge education completed, patient verbalized understanding of education given and has no additional questions. Medication Eliquis was given to patient yesterday. All education completed, no IV access.

## 2019-06-13 NOTE — Progress Notes (Signed)
Physical Therapy Treatment Patient Details Name: [facility] MRN: [id number] DOB: 09-20-1955 Today's Date: 06/13/2019    History of Present Illness Pt is a 64 y/o male admitted secondary to worsening chest pain and SOB. Found to have PE and LLE DVT. PMH includes HTN, CHF, GI bleed, and chronic back pain.     PT Comments    Pt continues to complain of cramping and pain which is limiting his functional mobility, however overall is not requiring assistance for OOB mobility at this time as long as he has the RW for support. Pt negotiated around room fairly well to use bathroom and transition from sitting EOB to go around to the recliner. He was able to improve ambulation distance but we were still not able to make it to the stairwell for stair training, and pt does have a full flight to enter his home. Fortunately, son will be present to help pt inside, however he will be alone most of the time after son leaves. Will continue to follow and progress as able per POC.     Follow Up Recommendations  Home health PT;Supervision for mobility/OOB     Equipment Recommendations  None recommended by PT    Recommendations for Other Services       Precautions / Restrictions Precautions Precautions: Fall Restrictions Weight Bearing Restrictions: No    Mobility  Bed Mobility               General bed mobility comments: Pt sitting up in recliner upon PT arrival.   Transfers Overall transfer level: Needs assistance Equipment used: Rolling walker (2 wheeled);None Transfers: Sit to/from Stand Sit to Stand: Modified independent (Device/Increase time)         General transfer comment: Pt was able to stand without assistance and no gross unsteadiness or LOB noted. Pt stood both from recliner chair and low toilet height.   Ambulation/Gait Ambulation/Gait assistance: Supervision Gait Distance (Feet): 150 Feet Assistive device: Rolling walker (2 wheeled) Gait Pattern/deviations:  Step-through pattern;Decreased stride length;Wide base of support;Trunk flexed Gait velocity: Decreased Gait velocity interpretation: <1.31 ft/sec, indicative of household ambulator General Gait Details: Around room and then out to hallway. Pt with reported cramping and difficulty maintaining upright posture. Noted pt "shaking out" RUE and indicates R tricep as area that is cramping the most.    Stairs         General stair comments: We were not able to make it to the stairwell for stair training due to cramping and fatigue.    Wheelchair Mobility    Modified Rankin (Stroke Patients Only)       Balance Overall balance assessment: Needs assistance Sitting-balance support: No upper extremity supported;Feet supported Sitting balance-Leahy Scale: Good     Standing balance support: No upper extremity supported Standing balance-Leahy Scale: Fair Standing balance comment: min guard for safety                            Cognition Arousal/Alertness: Awake/alert Behavior During Therapy: WFL for tasks assessed/performed Overall Cognitive Status: Within Functional Limits for tasks assessed                                        Exercises      General Comments        Pertinent Vitals/Pain Pain Assessment: Faces Faces Pain Scale: Hurts whole lot Pain  Location: LE's, shoulders, back Pain Descriptors / Indicators: Cramping Pain Intervention(s): Monitored during session;Repositioned    Home Living                      Prior Function            PT Goals (current goals can now be found in the care plan section) Acute Rehab PT Goals Patient Stated Goal: Stop muscle cramping PT Goal Formulation: With patient Time For Goal Achievement: 06/23/19 Potential to Achieve Goals: Good Progress towards PT goals: Progressing toward goals    Frequency    Min 3X/week      PT Plan Current plan remains appropriate    Co-evaluation               AM-PAC PT "6 Clicks" Mobility   Outcome Measure  Help needed turning from your back to your side while in a flat bed without using bedrails?: None Help needed moving from lying on your back to sitting on the side of a flat bed without using bedrails?: None Help needed moving to and from a bed to a chair (including a wheelchair)?: None Help needed standing up from a chair using your arms (e.g., wheelchair or bedside chair)?: None Help needed to walk in hospital room?: A Little Help needed climbing 3-5 steps with a railing? : A Little 6 Click Score: 22    End of Session Equipment Utilized During Treatment: Gait belt Activity Tolerance: Patient tolerated treatment well Patient left: in chair;with call bell/phone within reach Nurse Communication: Mobility status PT Visit Diagnosis: Muscle weakness (generalized) (M62.81)     Time: 1423-9532 PT Time Calculation (min) (ACUTE ONLY): 36 min  Charges:  $Gait Training: 23-37 mins                     Rolinda Roan, PT, DPT Acute Rehabilitation Services Pager: [phone] Office: [phone]    Thelma Comp 06/13/2019, 2:57 PM

## 2019-06-15 DIAGNOSIS — J45909 Unspecified asthma, uncomplicated: Secondary | ICD-10-CM | POA: Diagnosis not present

## 2019-06-15 DIAGNOSIS — D696 Thrombocytopenia, unspecified: Secondary | ICD-10-CM | POA: Diagnosis not present

## 2019-06-15 DIAGNOSIS — I110 Hypertensive heart disease with heart failure: Secondary | ICD-10-CM | POA: Diagnosis not present

## 2019-06-15 DIAGNOSIS — I509 Heart failure, unspecified: Secondary | ICD-10-CM | POA: Diagnosis not present

## 2019-06-15 DIAGNOSIS — K7460 Unspecified cirrhosis of liver: Secondary | ICD-10-CM | POA: Diagnosis not present

## 2019-06-15 DIAGNOSIS — D649 Anemia, unspecified: Secondary | ICD-10-CM | POA: Diagnosis not present

## 2019-06-19 DIAGNOSIS — I509 Heart failure, unspecified: Secondary | ICD-10-CM | POA: Diagnosis not present

## 2019-06-19 DIAGNOSIS — D696 Thrombocytopenia, unspecified: Secondary | ICD-10-CM | POA: Diagnosis not present

## 2019-06-19 DIAGNOSIS — D649 Anemia, unspecified: Secondary | ICD-10-CM | POA: Diagnosis not present

## 2019-06-19 DIAGNOSIS — J45909 Unspecified asthma, uncomplicated: Secondary | ICD-10-CM | POA: Diagnosis not present

## 2019-06-19 DIAGNOSIS — K7460 Unspecified cirrhosis of liver: Secondary | ICD-10-CM | POA: Diagnosis not present

## 2019-06-19 DIAGNOSIS — I110 Hypertensive heart disease with heart failure: Secondary | ICD-10-CM | POA: Diagnosis not present

## 2019-06-22 DIAGNOSIS — G4700 Insomnia, unspecified: Secondary | ICD-10-CM | POA: Diagnosis not present

## 2019-06-22 DIAGNOSIS — I110 Hypertensive heart disease with heart failure: Secondary | ICD-10-CM | POA: Diagnosis not present

## 2019-06-22 DIAGNOSIS — G894 Chronic pain syndrome: Secondary | ICD-10-CM | POA: Diagnosis not present

## 2019-06-22 DIAGNOSIS — D649 Anemia, unspecified: Secondary | ICD-10-CM | POA: Diagnosis not present

## 2019-06-22 DIAGNOSIS — I82402 Acute embolism and thrombosis of unspecified deep veins of left lower extremity: Secondary | ICD-10-CM | POA: Diagnosis not present

## 2019-06-22 DIAGNOSIS — D696 Thrombocytopenia, unspecified: Secondary | ICD-10-CM | POA: Diagnosis not present

## 2019-06-22 DIAGNOSIS — E785 Hyperlipidemia, unspecified: Secondary | ICD-10-CM | POA: Diagnosis not present

## 2019-06-22 DIAGNOSIS — Z9181 History of falling: Secondary | ICD-10-CM | POA: Diagnosis not present

## 2019-06-22 DIAGNOSIS — I5030 Unspecified diastolic (congestive) heart failure: Secondary | ICD-10-CM | POA: Diagnosis not present

## 2019-06-22 DIAGNOSIS — Z7901 Long term (current) use of anticoagulants: Secondary | ICD-10-CM | POA: Diagnosis not present

## 2019-06-22 DIAGNOSIS — J45909 Unspecified asthma, uncomplicated: Secondary | ICD-10-CM | POA: Diagnosis not present

## 2019-06-22 DIAGNOSIS — Z6841 Body Mass Index (BMI) 40.0 and over, adult: Secondary | ICD-10-CM | POA: Diagnosis not present

## 2019-06-22 DIAGNOSIS — M5126 Other intervertebral disc displacement, lumbar region: Secondary | ICD-10-CM | POA: Diagnosis not present

## 2019-06-22 DIAGNOSIS — I2699 Other pulmonary embolism without acute cor pulmonale: Secondary | ICD-10-CM | POA: Diagnosis not present

## 2019-06-22 DIAGNOSIS — K219 Gastro-esophageal reflux disease without esophagitis: Secondary | ICD-10-CM | POA: Diagnosis not present

## 2019-06-22 DIAGNOSIS — K7581 Nonalcoholic steatohepatitis (NASH): Secondary | ICD-10-CM | POA: Diagnosis not present

## 2019-06-25 DIAGNOSIS — D696 Thrombocytopenia, unspecified: Secondary | ICD-10-CM | POA: Diagnosis not present

## 2019-06-25 DIAGNOSIS — I2699 Other pulmonary embolism without acute cor pulmonale: Secondary | ICD-10-CM | POA: Diagnosis not present

## 2019-06-25 DIAGNOSIS — I82402 Acute embolism and thrombosis of unspecified deep veins of left lower extremity: Secondary | ICD-10-CM | POA: Diagnosis not present

## 2019-06-25 DIAGNOSIS — K7581 Nonalcoholic steatohepatitis (NASH): Secondary | ICD-10-CM | POA: Diagnosis not present

## 2019-06-25 DIAGNOSIS — I5030 Unspecified diastolic (congestive) heart failure: Secondary | ICD-10-CM | POA: Diagnosis not present

## 2019-06-25 DIAGNOSIS — I110 Hypertensive heart disease with heart failure: Secondary | ICD-10-CM | POA: Diagnosis not present

## 2019-06-26 ENCOUNTER — Telehealth: Payer: Self-pay | Admitting: Cardiology

## 2019-06-26 ENCOUNTER — Emergency Department (HOSPITAL_COMMUNITY): Payer: Medicare Other

## 2019-06-26 ENCOUNTER — Encounter (HOSPITAL_COMMUNITY): Payer: Self-pay

## 2019-06-26 ENCOUNTER — Emergency Department (HOSPITAL_COMMUNITY)
Admission: EM | Admit: 2019-06-26 | Discharge: 2019-06-26 | Disposition: A | Discharge status: Home / Self Care | Payer: Medicare Other | Attending: Emergency Medicine | Admitting: Emergency Medicine

## 2019-06-26 ENCOUNTER — Other Ambulatory Visit: Payer: Self-pay

## 2019-06-26 DIAGNOSIS — I5030 Unspecified diastolic (congestive) heart failure: Secondary | ICD-10-CM | POA: Diagnosis not present

## 2019-06-26 DIAGNOSIS — J80 Acute respiratory distress syndrome: Secondary | ICD-10-CM | POA: Diagnosis not present

## 2019-06-26 DIAGNOSIS — Z79899 Other long term (current) drug therapy: Secondary | ICD-10-CM | POA: Diagnosis not present

## 2019-06-26 DIAGNOSIS — R0602 Shortness of breath: Secondary | ICD-10-CM | POA: Diagnosis not present

## 2019-06-26 DIAGNOSIS — Z20822 Contact with and (suspected) exposure to covid-19: Secondary | ICD-10-CM | POA: Diagnosis not present

## 2019-06-26 DIAGNOSIS — I82402 Acute embolism and thrombosis of unspecified deep veins of left lower extremity: Secondary | ICD-10-CM | POA: Diagnosis not present

## 2019-06-26 DIAGNOSIS — D696 Thrombocytopenia, unspecified: Secondary | ICD-10-CM | POA: Diagnosis not present

## 2019-06-26 DIAGNOSIS — I110 Hypertensive heart disease with heart failure: Secondary | ICD-10-CM | POA: Diagnosis not present

## 2019-06-26 DIAGNOSIS — I2699 Other pulmonary embolism without acute cor pulmonale: Secondary | ICD-10-CM | POA: Insufficient documentation

## 2019-06-26 DIAGNOSIS — I509 Heart failure, unspecified: Secondary | ICD-10-CM | POA: Diagnosis not present

## 2019-06-26 DIAGNOSIS — R531 Weakness: Secondary | ICD-10-CM | POA: Diagnosis not present

## 2019-06-26 DIAGNOSIS — K7581 Nonalcoholic steatohepatitis (NASH): Secondary | ICD-10-CM | POA: Diagnosis not present

## 2019-06-26 HISTORY — DX: Essential (primary) hypertension: I10

## 2019-06-26 HISTORY — DX: Other disorders of plasma-protein metabolism, not elsewhere classified: E88.09

## 2019-06-26 HISTORY — DX: Chronic pain syndrome: G89.4

## 2019-06-26 HISTORY — DX: Drug induced constipation: K59.03

## 2019-06-26 HISTORY — DX: Hypo-osmolality and hyponatremia: E87.1

## 2019-06-26 HISTORY — DX: Hypokalemia: E87.6

## 2019-06-26 HISTORY — DX: Unspecified cirrhosis of liver: K74.60

## 2019-06-26 HISTORY — DX: Localized edema: R60.0

## 2019-06-26 HISTORY — DX: Dyspnea, unspecified: R06.00

## 2019-06-26 HISTORY — DX: Unspecified protein-calorie malnutrition: E46

## 2019-06-26 HISTORY — DX: Edema, unspecified: R60.9

## 2019-06-26 HISTORY — DX: Hypotension due to drugs: I95.2

## 2019-06-26 HISTORY — DX: Acute respiratory failure with hypoxia: J96.01

## 2019-06-26 HISTORY — DX: Anemia in other chronic diseases classified elsewhere: D63.8

## 2019-06-26 HISTORY — DX: Thrombocytopenia, unspecified: D69.6

## 2019-06-26 HISTORY — DX: Shock, unspecified: R57.9

## 2019-06-26 LAB — TROPONIN I (HIGH SENSITIVITY)
Troponin I (High Sensitivity): 4 ng/L (ref <18)
Troponin I (High Sensitivity): 4 ng/L (ref <18)

## 2019-06-26 LAB — CBC
HCT: 36.1 % — ABNORMAL LOW (ref 39.0–52.0)
Hemoglobin: 11.2 g/dL — ABNORMAL LOW (ref 13.0–17.0)
MCH: 24.4 pg — ABNORMAL LOW (ref 26.0–34.0)
MCHC: 31 g/dL (ref 30.0–36.0)
MCV: 78.6 fL — ABNORMAL LOW (ref 80.0–100.0)
Platelets: 152 10*3/uL (ref 150–400)
RBC: 4.59 MIL/uL (ref 4.22–5.81)
RDW: 16.6 % — ABNORMAL HIGH (ref 11.5–15.5)
WBC: 8.8 10*3/uL (ref 4.0–10.5)
nRBC: 0 % (ref 0.0–0.2)

## 2019-06-26 LAB — BASIC METABOLIC PANEL
Anion gap: 13 (ref 5–15)
BUN: 23 mg/dL (ref 8–23)
CO2: 29 mmol/L (ref 22–32)
Calcium: 9.4 mg/dL (ref 8.9–10.3)
Chloride: 91 mmol/L — ABNORMAL LOW (ref 98–111)
Creatinine, Ser: 1.03 mg/dL (ref 0.61–1.24)
GFR calc Af Amer: >60 mL/min (ref >60)
GFR calc non Af Amer: >60 mL/min (ref >60)
Glucose, Bld: 131 mg/dL — ABNORMAL HIGH (ref 70–99)
Potassium: 3.7 mmol/L (ref 3.5–5.1)
Sodium: 133 mmol/L — ABNORMAL LOW (ref 135–145)

## 2019-06-26 LAB — HEPATIC FUNCTION PANEL
ALT: 25 U/L (ref 0–44)
AST: 33 U/L (ref 15–41)
Albumin: 3.5 g/dL (ref 3.5–5.0)
Alkaline Phosphatase: 66 U/L (ref 38–126)
Bilirubin, Direct: 0.1 mg/dL (ref 0.0–0.2)
Indirect Bilirubin: 0.7 mg/dL (ref 0.3–0.9)
Total Bilirubin: 0.8 mg/dL (ref 0.3–1.2)
Total Protein: 7.6 g/dL (ref 6.5–8.1)

## 2019-06-26 LAB — POC SARS CORONAVIRUS 2 AG -  ED: SARS Coronavirus 2 Ag: NEGATIVE

## 2019-06-26 LAB — AMMONIA: Ammonia: 57 umol/L — ABNORMAL HIGH (ref 9–35)

## 2019-06-26 LAB — SARS CORONAVIRUS 2 (TAT 6-24 HRS): SARS Coronavirus 2: NEGATIVE

## 2019-06-26 LAB — BRAIN NATRIURETIC PEPTIDE: B Natriuretic Peptide: 66.1 pg/mL (ref 0.0–100.0)

## 2019-06-26 MED ORDER — IOHEXOL 350 MG/ML SOLN
100.0000 mL | Freq: Once | INTRAVENOUS | Status: AC | PRN
  Administered 2019-06-26: 100 mL via INTRAVENOUS

## 2019-06-26 MED ORDER — SODIUM CHLORIDE 0.9% FLUSH: 3.0000 mL | Freq: Once | INTRAVENOUS | Status: DC

## 2019-06-26 NOTE — ED Provider Notes (Signed)
4:20 PM-discussed with Dr. Regenia Skeeter, patient to get repeat CT imaging, to evaluate for progression or regression of PE.   Patient Vitals for the past 24 hrs:  BP Temp Temp src Pulse Resp SpO2  06/26/19 1837 125/67 -- -- 86 15 99 %  06/26/19 1220 104/72 98.8 F (37.1 C) Oral (!) 101 -- 95 %    7:13 PM Reevaluation with update and discussion. After initial assessment and treatment, an updated evaluation reveals he is comfortable, denies chest pain or shortness of breath at this time.  Findings discussed with him and all questions were answered. [name]   Medical Decision Making: Evaluation for dyspnea, with repeat CT imaging shows decreased burden of PEs.  No indication for hospitalization or further intervention from the ED setting at this time.  [facility] was evaluated in Emergency Department on 06/26/2019 for the symptoms described in the history of present illness. He was evaluated in the context of the global COVID-19 pandemic, which necessitated consideration that the patient might be at risk for infection with the SARS-CoV-2 virus that causes COVID-19. Institutional protocols and algorithms that pertain to the evaluation of patients at risk for COVID-19 are in a state of rapid change based on information released by regulatory bodies including the CDC and federal and state organizations. These policies and algorithms were followed during the patient's care in the ED.   CRITICAL CARE-no Performed by: [name]   Nursing Notes Reviewed/ Care Coordinated Applicable Imaging Reviewed Interpretation of Laboratory Data incorporated into ED treatment  The patient appears reasonably screened and/or stabilized for discharge and I doubt any other medical condition or other [facility] requiring further screening, evaluation, or treatment in the ED at this time prior to discharge.  Plan: Home Medications-continue current; Home Treatments-rest, fluids; return here if the recommended  treatment, does not improve the symptoms; Recommended follow up-cardiology follow-up tomorrow as scheduled, PCP, as needed for close follow-up of ongoing management of pulmonary emboli.      [name], MD 06/26/19 [phone]

## 2019-06-26 NOTE — ED Notes (Signed)
Patient states he was at home today c/o sob after taking his am medications, states this has been happening for several days.  C/o increased swelling in bilateral ankles. States his home health nurse call ems he didn't want to come however she talked him into it. Denies pain at present. States he has an appointment with his cards. Tomorrow.

## 2019-06-26 NOTE — Telephone Encounter (Signed)
Silvano Bilis, nurse with [facility] who states she is presently with the patient and is concerned about the patient's elevated HR and confusion. She states she has not seen him like this before. I advised that patient should be transported to the hospital due to recent hx of PE. Santiago Glad states she agrees and is going to call 911. She asks if patient has an appointment tomorrow, which I confirmed. I advised I will leave appointment on the schedule and asked her to have his family call back to cancel if patient gets admitted. She verbalized understanding and agreement and thanked me for the call.

## 2019-06-26 NOTE — Telephone Encounter (Signed)
Follow up   Attempted to reach someone in triage for Stat call per the previous message. I could not reach anyone sent message per the prior message.

## 2019-06-26 NOTE — Discharge Instructions (Addendum)
The testing indicates that the pulmonary emboli are smaller, than last month.  Continue taking your Eliquis.  Follow-up with your cardiologist tomorrow as scheduled.  Consider seeing your primary care doctor for follow-up care and treatment in the next week or so.

## 2019-06-26 NOTE — ED Triage Notes (Signed)
Per EMS pt from home with SOB and weakness x4 days, reports nausea on arrival to ED. Pt speaking a complete sentences but tripod on ems arrival. Lung sounds diminished on the Left. Recently dc from our facility 2 weeks ago   94% RA  96% 4L [place] BP 117/70 HR 96 RR 20 98.8 CBG 150  20G LH

## 2019-06-26 NOTE — ED Provider Notes (Signed)
[facility] DE[facility]MENT Provider Note   CSN: [id number] Arrival date & time: 06/26/19  1210     History Chief Complaint  Patient presents with  . Shortness of Breath    Charles Gray is a 65 y.o. male.  HPI 64 year old male presents with shortness of breath.  Ongoing for a couple days.  His home health nurse called EMS because of his shortness of breath.  He states he was short of breath more than typical while doing laundry.  He has been having a little more leg swelling than typical.  No cough or chest pain.  No headache.  Nurse notes in the chart indicate he was also confused at the time but currently he is not confused and feels better.  Shortness of breath is minimal while at rest in the stretcher.  Recently diagnosed with PE and reports compliance with his meds.  States his abdomen has been swollen but no abdominal pain at this time.   Past Medical History:  Diagnosis Date  . Acute respiratory failure with hypoxia ([place])   . Anemia of chronic disease   . Benign essential HTN   . Chronic heart failure with preserved ejection fraction ([place]) 05/30/2019  . Chronic pain syndrome   . Cirrhosis of liver without ascites ([place])   . Debility 03/13/2019  . Drug induced constipation   . Dyspnea   . Fatty liver disease, nonalcoholic   . GIB (gastrointestinal bleeding) 03/04/2019  . Hyperlipidemia 01/24/2019  . Hypertension 01/24/2019  . Hypoalbuminemia due to protein-calorie malnutrition ([place])   . Hypokalemia   . Hyponatremia   . Hypotension due to drugs   . Insomnia 01/24/2019  . Lumbar disc herniation 01/24/2019  . Morbid obesity with BMI of 40.0-44.9, adult ([address]) 05/30/2019  . Normocytic anemia 06/08/2019  . Peripheral edema   . Pulmonary emboli ([place]) 06/08/2019  . Pulmonary embolism ([place]) 06/08/2019  . Shock ([place])   . Thrombocytopenia [facility])     Patient Active Problem List   Diagnosis Date Noted  . Pulmonary embolism ([place]) 06/08/2019  .  Normocytic anemia 06/08/2019  . Pulmonary emboli ([place]) 06/08/2019  . Chronic heart failure with preserved ejection fraction ([place]) 05/30/2019  . Morbid obesity with BMI of 40.0-44.9, adult ([place]) 05/30/2019  . Hyponatremia   . Benign essential HTN   . Drug induced constipation   . Peripheral edema   . Hypotension due to drugs   . Hypoalbuminemia due to protein-calorie malnutrition ([place])   . Hypokalemia   . Thrombocytopenia ([place])   . Anemia of chronic disease   . Cirrhosis of liver without ascites ([place])   . Chronic pain syndrome   . Debility 03/13/2019  . Acute respiratory failure with hypoxia ([place])   . Shock ([place])   . Dyspnea   . GIB (gastrointestinal bleeding) 03/04/2019  . Hyperlipidemia 01/24/2019  . Hypertension 01/24/2019  . Insomnia 01/24/2019    Past Surgical History:  Procedure Laterality Date  . ESOPHAGOGASTRODUODENOSCOPY (EGD) WITH PROPOFOL N/A 03/05/2019   Procedure: ESOPHAGOGASTRODUODENOSCOPY (EGD) WITH PROPOFOL;  Surgeon: Doran Stabler, MD;  Location: [place];  Service: Gastroenterology;  Laterality: N/A;  Large Blood Clot Removed  . ESOPHAGOGASTRODUODENOSCOPY (EGD) WITH PROPOFOL N/A 03/06/2019   Procedure: ESOPHAGOGASTRODUODENOSCOPY (EGD) WITH PROPOFOL;  Surgeon: Thornton Park, MD;  Location: [place];  Service: Gastroenterology;  Laterality: N/A;  . HEMORROIDECTOMY         Family History  Problem Relation Age of Onset  . Hyperlipidemia Mother   . Hypertension  Mother   . Stroke Mother   . CAD Maternal Grandmother   . CAD Maternal Grandfather     Social History   Tobacco Use  . Smoking status: Never Smoker  . Smokeless tobacco: Never Used  Substance Use Topics  . Alcohol use: Never  . Drug use: Not Currently    Types: Marijuana    Comment: Smoked for 30 years    Home Medications Prior to Admission medications   Medication Sig Start Date End Date Taking? Authorizing Provider  acetaminophen (TYLENOL) 325 MG tablet Take 2 tablets  (650 mg total) by mouth every 4 (four) hours as needed for fever or mild pain. 03/23/19  Yes Angiulli, Lavon Paganini, PA-C  apixaban (ELIQUIS) 5 MG TABS tablet Take 2 tablets (66m) twice daily for 7 days, then 1 tablet (527m twice daily 06/12/19  Yes Vann, Jessica U, DO  Buprenorphine HCl-Naloxone HCl 8-2 MG FILM Place 1 tablet under the tongue in the morning and at bedtime.    Yes [provider]  diphenhydrAMINE (BENADRYL) 25 MG tablet Take 25 mg by mouth every 6 (six) hours as needed for itching or allergies.   Yes [provider]  docusate sodium (COLACE) 100 MG capsule Take 1 capsule (100 mg total) by mouth daily as needed for mild constipation. 06/12/19  Yes Vann, Jessica U, DO  furosemide (LASIX) 40 MG tablet Take 1 tablet (40 mg total) by mouth at bedtime. 06/12/19  Yes Vann, Jessica U, DO  furosemide (LASIX) 80 MG tablet Take 1 tablet (80 mg total) by mouth daily. 06/12/19  Yes Vann, Jessica U, DO  gabapentin (NEURONTIN) 800 MG tablet Take 1 tablet (800 mg total) by mouth 3 (three) times daily. 03/23/19  Yes Angiulli, DaLavon PaganiniPA-C  lisinopril (ZESTRIL) 5 MG tablet Take 3 tablets (15 mg total) by mouth daily. 03/23/19  Yes Angiulli, DaLavon PaganiniPA-C  metolazone (ZAROXOLYN) 5 MG tablet Take 1 tablet (5 mg total) by mouth every other day. 06/13/19  Yes Vann, Jessica U, DO  pantoprazole (PROTONIX) 40 MG tablet Take 40 mg by mouth 2 (two) times daily.   Yes [provider]  polyethylene glycol (MIRALAX / GLYCOLAX) 17 g packet Take 17 g by mouth daily. Patient taking differently: Take 17 g by mouth daily as needed for moderate constipation.  03/23/19  Yes Angiulli, DaLavon PaganiniPA-C  potassium chloride (KLOR-CON) 10 MEQ tablet Take 2 tablets (20 mEq total) by mouth 2 (two) times daily. 05/30/19  Yes Tobb, Kardie, DO  pravastatin (PRAVACHOL) 20 MG tablet Take 1 tablet (20 mg total) by mouth daily. 03/23/19  Yes Angiulli, DaLavon PaganiniPA-C  VENTOLIN HFA 108 (90 Base) MCG/ACT inhaler Inhale 1  puff into the lungs every 4 (four) hours as needed for shortness of breath. 03/23/19  Yes Angiulli, DaLavon PaganiniPA-C  zolpidem (AMBIEN) 10 MG tablet Take 10 mg by mouth at bedtime as needed for sleep.  05/19/19  Yes [provider]    Allergies    Penicillins  Review of Systems   Review of Systems  Constitutional: Negative for fever.  Respiratory: Positive for shortness of breath. Negative for cough.   Cardiovascular: Positive for leg swelling. Negative for chest pain.  Gastrointestinal: Positive for abdominal distention. Negative for abdominal pain and vomiting.  All other systems reviewed and are negative.   Physical Exam Updated Vital Signs BP 104/72 (BP Location: Left Arm)   Pulse (!) 101   Temp 98.8 F (37.1 C) (Oral)   SpO2  95%   Physical Exam Vitals and nursing note reviewed.  Constitutional:      General: He is not in acute distress.    Appearance: He is well-developed. He is obese. He is not ill-appearing or diaphoretic.  HENT:     Head: Normocephalic and atraumatic.     Right Ear: External ear normal.     Left Ear: External ear normal.     Nose: Nose normal.  Eyes:     General:        Right eye: No discharge.        Left eye: No discharge.  Cardiovascular:     Rate and Rhythm: Regular rhythm. Tachycardia present.     Heart sounds: Normal heart sounds.     Comments: HR~100 Pulmonary:     Effort: Pulmonary effort is normal. No tachypnea or accessory muscle usage.     Breath sounds: Normal breath sounds.  Abdominal:     Palpations: Abdomen is soft.     Tenderness: There is no abdominal tenderness.  Musculoskeletal:     Cervical back: Neck supple.     Right lower leg: Edema present.     Left lower leg: Edema present.     Comments: Chronic appearing BLE. No significant unilateral swelling  Skin:    General: Skin is warm and dry.  Neurological:     Mental Status: He is alert and oriented to person, place, and time.  Psychiatric:        Mood and  Affect: Mood is not anxious.     ED Results / Procedures / Treatments   Labs (all labs ordered are listed, but only abnormal results are displayed) Labs Reviewed  BASIC METABOLIC PANEL - Abnormal; Notable for the following components:      Result Value   Sodium 133 (*)    Chloride 91 (*)    Glucose, Bld 131 (*)    All other components within normal limits  CBC - Abnormal; Notable for the following components:   Hemoglobin 11.2 (*)    HCT 36.1 (*)    MCV 78.6 (*)    MCH 24.4 (*)    RDW 16.6 (*)    All other components within normal limits  AMMONIA - Abnormal; Notable for the following components:   Ammonia 57 (*)    All other components within normal limits  SARS CORONAVIRUS 2 (TAT 6-24 HRS)  BRAIN NATRIURETIC PEPTIDE  HEPATIC FUNCTION PANEL  POC SARS CORONAVIRUS 2 AG -  ED  TROPONIN I (HIGH SENSITIVITY)  TROPONIN I (HIGH SENSITIVITY)    EKG EKG Interpretation  Date/Time:  Monday June 26 2019 12:22:44 EDT Ventricular Rate:  101 PR Interval:  152 QRS Duration: 100 QT Interval:  364 QTC Calculation: 471 R Axis:   -68 Text Interpretation: Sinus tachycardia Left anterior fascicular block Abnormal ECG no significant change since 2021 Confirmed by Sherwood Gambler [phone]) on 06/26/2019 12:58:52 PM   Radiology DG Chest 2 View  Result Date: 06/26/2019 CLINICAL DATA:  Shortness of breath. Weakness. Symptoms for 4 days. EXAM: CHEST - 2 VIEW COMPARISON:  Radiograph and CT 06/08/2019 FINDINGS: The cardiomediastinal contours are normal. The lungs are clear. Pulmonary vasculature is normal. No consolidation, pleural effusion, or pneumothorax. No acute osseous abnormalities are seen. IMPRESSION: No acute chest findings. Electronically Signed   By: Keith Rake M.D.   On: 06/26/2019 13:33    Procedures Procedures (including critical care time)  Medications Ordered in ED Medications  sodium chloride flush (NS) 0.9 %  injection 3 mL (has no administration in time range)     ED Course  I have reviewed the triage vital signs and the nursing notes.  Pertinent labs & imaging results that were available during my care of the patient were reviewed by me and considered in my medical decision making (see chart for details).    MDM Rules/Calculators/A&P                      Patient is in no acute distress.  He is not hypoxic or having increased work of breathing on exam.  Given recent PE diagnosis, will get CTA to ensure no progression or worsening.  Otherwise, if second troponin and CT are negative I think he can be discharged home and follow-up with PCP.  No acute infectious symptoms. Care to Dr. Eulis Foster.  Leray Garverick was evaluated in Emergency Department on 06/26/2019 for the symptoms described in the history of present illness. He was evaluated in the context of the global COVID-19 pandemic, which necessitated consideration that the patient might be at risk for infection with the SARS-CoV-2 virus that causes COVID-19. Institutional protocols and algorithms that pertain to the evaluation of patients at risk for COVID-19 are in a state of rapid change based on information released by regulatory bodies including the CDC and federal and state organizations. These policies and algorithms were followed during the patient's care in the ED.  Final Clinical Impression(s) / ED Diagnoses Final diagnoses:  None    Rx / DC Orders ED Discharge Orders    None       Sherwood Gambler, MD 06/26/19 1622

## 2019-06-26 NOTE — Telephone Encounter (Signed)
New message   STAT if HR is under 50 or over 120 (normal HR is 60-100 beats per minute)  1) What is your heart rate? 130-135  2) Do you have a log of your heart rate readings (document readings)? No   3) Do you have any other symptoms? Sob, confusion

## 2019-06-27 ENCOUNTER — Ambulatory Visit (INDEPENDENT_AMBULATORY_CARE_PROVIDER_SITE_OTHER): Payer: Medicare Other | Admitting: Cardiology

## 2019-06-27 ENCOUNTER — Encounter: Payer: Self-pay | Admitting: Cardiology

## 2019-06-27 VITALS — BP 126/66 | Temp 98.0°F | Ht >= 80 in | Wt 383.6 lb

## 2019-06-27 DIAGNOSIS — K254 Chronic or unspecified gastric ulcer with hemorrhage: Secondary | ICD-10-CM

## 2019-06-27 DIAGNOSIS — Z6841 Body Mass Index (BMI) 40.0 and over, adult: Secondary | ICD-10-CM

## 2019-06-27 DIAGNOSIS — I2693 Single subsegmental pulmonary embolism without acute cor pulmonale: Secondary | ICD-10-CM

## 2019-06-27 DIAGNOSIS — I5032 Chronic diastolic (congestive) heart failure: Secondary | ICD-10-CM | POA: Diagnosis not present

## 2019-06-27 DIAGNOSIS — I10 Essential (primary) hypertension: Secondary | ICD-10-CM

## 2019-06-27 NOTE — Progress Notes (Signed)
Cardiology Office Note:    Date:  06/27/2019   ID:  Livan Hires, DOB [birth year], MRN [id number]  PCP:  Enid Skeens., MD  Cardiologist:  Berniece Salines, DO  Electrophysiologist:  None   Referring MD: Enid Skeens., MD   Follow up for heart failure with preserved Ejection  fraction  History of Present Illness:    Bacilio Tarkentonis a 64 y.o.malewith a hx of hypertension, hyperlipidemia, morbid obesity, history of nonalcoholic fatty liver disease,herniated disc with chronic pain maintained on Suboxone followed by pain management, morbid obesity BMI 42.60.   Patient was initially seen by me on January 24, 2019 at which time he complained of shortness of breath and fatigue. He did tell me that he was experiencing orthopnea and PND. Therefore at the conclusion of the visit start patient on Lasix 40 mg every 12 hours with potassium 20 mEq daily. An echocardiogram was also ordered and patient was referred to GI for his alcoholic fatty liver disease.   I did see the patient, he appears to be losing weight on the diuretic therapy. At his initial visit he was 410 LBSand his visit onOctober 20, 2020 he was 405 pounds. Although the weight loss the patient still was not euvolemic therefore recommended the patient go to the ED at the [facility] to be given IV diuretics but he declined at that time.  I did see the patient again on February 27, 2019 at that time recommended hospitalization and he declined. I did increase his lasix dose to 80 mg in the morning and 40 mg at night with metolazone.  He called on 03/02/2019 requesting an increase in his diuretics dose at that time I did tell the patient he needed to go to the ED - Thankfully he did go to the ED.  The patient was hospitalized at [facility] on 03/02/2019 and then was transferred to [facility]. During his hospital course acute drop in blood pressure as well as hemoglobin with melanotic  stool. He had increasing leukocytosis and lactic acidosis but was afebrile. Placed on broad-spectrum antibiotics. CT of abdomen pelvis abdominal CTA without obvious source of bleeding. Hemoglobin did drop from 14-7.5. He was given 3 units packed red blood cells placed on Protonix and transferred to [facility] 03/04/2019 for further evaluation. Patient did require intubation for airway protection. Upper GI endoscopy 03/05/2019 per gastroenterology Dr. Pura Spice no varices seen. A large amount of fresh and clotted blood was found in the gastric fundus and in the proximal gastric body. Very mild portal hypertensive gastropathy. There was a chain of lesions on a fold in the distal gastric fundus with purple discoloration. Patient self extubated 03/06/2019. Bouts initially of confusion agitation cranial CT scan 03/07/2019 showed no acute findings. Hemoglobin stabilized 8.4. He was noted to have  thrombocytopenia improved 111000-136000 felt to be reactive versus cirrhosis.  He was discharged to rehab. He was able to complete his rehab.  I did see the patient 05/30/2019 post hospitalization, at that visit I restarted his lasix 44m twice a day and stopped his metolazone.  Blood work was also done.  During our visit I encouraged the patient to decrease his salt intake as he was eating Ramen noodles every day.  In the interim I had to put the patient back on his metolazone given his leg edema was worsening.  He also continued to have worsening shortness of breath [facility].  The patient was started on Eliquis 5 mg twice a day.  Then yesterday June 26, 2019 the patient noted that he did have another episode of shortness of breath went to the ED he was worked up no worsening of his pulmonary embolism in fact he was told that his PE was improving on the Eliquis and he was therefore discharged  home.  Here in the office today he tells me that he feels better.  His shortness of breath is improved but not resolved.  He denies any chest pain lightheadedness or dizziness.   Past Medical History:  Diagnosis Date  . Acute respiratory failure with hypoxia ([place])   . Anemia of chronic disease   . Benign essential HTN   . Chronic heart failure with preserved ejection fraction ([place]) 05/30/2019  . Chronic pain syndrome   . Cirrhosis of liver without ascites ([place])   . Debility 03/13/2019  . Drug induced constipation   . Dyspnea   . Fatty liver disease, nonalcoholic   . GIB (gastrointestinal bleeding) 03/04/2019  . Hyperlipidemia 01/24/2019  . Hypertension 01/24/2019  . Hypoalbuminemia due to protein-calorie malnutrition ([place])   . Hypokalemia   . Hyponatremia   . Hypotension due to drugs   . Insomnia 01/24/2019  . Lumbar disc herniation 01/24/2019  . Morbid obesity with BMI of 40.0-44.9, adult ([place]) 05/30/2019  . Normocytic anemia 06/08/2019  . Peripheral edema   . Pulmonary emboli ([place]) 06/08/2019  . Pulmonary embolism ([place]) 06/08/2019  . Shock ([place])   . Thrombocytopenia ([place])     Past Surgical History:  Procedure Laterality Date  . ESOPHAGOGASTRODUODENOSCOPY (EGD) WITH PROPOFOL N/A 03/05/2019   Procedure: ESOPHAGOGASTRODUODENOSCOPY (EGD) WITH PROPOFOL;  Surgeon: Doran Stabler, MD;  Location: [place];  Service: Gastroenterology;  Laterality: N/A;  Large Blood Clot Removed  . ESOPHAGOGASTRODUODENOSCOPY (EGD) WITH PROPOFOL N/A 03/06/2019   Procedure: ESOPHAGOGASTRODUODENOSCOPY (EGD) WITH PROPOFOL;  Surgeon: Thornton Park, MD;  Location: [place];  Service: Gastroenterology;  Laterality: N/A;  . HEMORROIDECTOMY      Current Medications: Current Meds  Medication Sig  . acetaminophen (TYLENOL) 325 MG tablet Take 2 tablets (650 mg total) by mouth every 4 (four) hours as needed for fever or mild pain.  Marland Kitchen apixaban (ELIQUIS) 5 MG TABS tablet Take 2 tablets (48m)  twice daily for 7 days, then 1 tablet (541m twice daily  . Buprenorphine HCl-Naloxone HCl 8-2 MG FILM Place 1 tablet under the tongue in the morning and at bedtime.   . diphenhydrAMINE (BENADRYL) 25 MG tablet Take 25 mg by mouth every 6 (six) hours as needed for itching or allergies.  . furosemide (LASIX) 40 MG tablet Take 1 tablet (40 mg total) by mouth at bedtime.  . furosemide (LASIX) 80 MG tablet Take 1 tablet (80 mg total) by mouth daily.  . Marland Kitchenabapentin (NEURONTIN) 800 MG tablet Take 1 tablet (800 mg total) by mouth 3 (three) times daily.  . Marland Kitchenisinopril (ZESTRIL) 5 MG tablet Take 3 tablets (15 mg total) by mouth daily.  . metolazone (ZAROXOLYN) 5 MG tablet Take 1 tablet (5 mg total) by mouth every other day.  . pantoprazole (PROTONIX) 40 MG tablet Take 40 mg by mouth 2 (two) times daily.  . polyethylene glycol (MIRALAX / GLYCOLAX) 17 g packet Take 17 g by mouth daily.  . potassium chloride SA (KLOR-CON) 20 MEQ tablet Take 20 mEq by mouth 2 (two) times daily.  . pravastatin (PRAVACHOL) 20 MG  tablet Take 1 tablet (20 mg total) by mouth daily.  . VENTOLIN HFA 108 (90 Base) MCG/ACT inhaler Inhale 1 puff into the lungs every 4 (four) hours as needed for shortness of breath.  . zolpidem (AMBIEN) 10 MG tablet Take 10 mg by mouth at bedtime as needed for sleep.      Allergies:   Penicillins   Social History   Socioeconomic History  . Marital status: Unknown    Spouse name: Not on file  . Number of children: Not on file  . Years of education: Not on file  . Highest education level: Not on file  Occupational History  . Not on file  Tobacco Use  . Smoking status: Never Smoker  . Smokeless tobacco: Never Used  Substance and Sexual Activity  . Alcohol use: Never  . Drug use: Not Currently    Types: Marijuana    Comment: Smoked for 30 years  . Sexual activity: Not on file  Other Topics Concern  . Not on file  Social History Narrative  . Not on file   Social Determinants of Health    Financial Resource Strain:   . Difficulty of Paying Living Expenses:   Food Insecurity:   . Worried About Charity fundraiser in the Last Year:   . Arboriculturist in the Last Year:   Transportation Needs:   . Film/video editor (Medical):   Marland Kitchen Lack of Transportation (Non-Medical):   Physical Activity:   . Days of Exercise per Week:   . Minutes of Exercise per Session:   Stress:   . Feeling of Stress :   Social Connections:   . Frequency of Communication with Friends and Family:   . Frequency of Social Gatherings with Friends and Family:   . Attends Religious Services:   . Active Member of Clubs or Organizations:   . Attends Archivist Meetings:   Marland Kitchen Marital Status:      Family History: The patient's family history includes CAD in his maternal grandfather and maternal grandmother; Hyperlipidemia in his mother; Hypertension in his mother; Stroke in his mother.  ROS:   Review of Systems  Constitution: Negative for decreased appetite, fever and weight gain.  HENT: Negative for congestion, ear discharge, hoarse voice and sore throat.   Eyes: Negative for discharge, redness, vision loss in right eye and visual halos.  Cardiovascular: Negative for chest pain, dyspnea on exertion, leg swelling, orthopnea and palpitations.  Respiratory: Negative for cough, hemoptysis, shortness of breath and snoring.   Endocrine: Negative for heat intolerance and polyphagia.  Hematologic/Lymphatic: Negative for bleeding problem. Does not bruise/bleed easily.  Skin: Negative for flushing, nail changes, rash and suspicious lesions.  Musculoskeletal: Negative for arthritis, joint pain, muscle cramps, myalgias, neck pain and stiffness.  Gastrointestinal: Negative for abdominal pain, bowel incontinence, diarrhea and excessive appetite.  Genitourinary: Negative for decreased libido, genital sores and incomplete emptying.  Neurological: Negative for brief paralysis, focal weakness, headaches  and loss of balance.  Psychiatric/Behavioral: Negative for altered mental status, depression and suicidal ideas.  Allergic/Immunologic: Negative for HIV exposure and persistent infections.    EKGs/Labs/Other Studies Reviewed:    The following studies were reviewed today:   EKG:  The ekg ordered today demonstrates   CTA chest 06/26/19 FINDINGS: Cardiovascular: No pulmonary embolism in the pulmonary trunk, right pulmonary artery or left pulmonary artery. Central contrast filling defects in the bilateral upper lobar pulmonary arteries and in the bibasilar segmental pulmonary arteries. The right  ventricle measures 4.4 cm. The left ventricle measures 5.6 cm. Right ventricular to left ventricular ratio is 0.78; no right heart strain. Normal caliber of the thoracic aorta without calcified atherosclerosis.  Mediastinum/Nodes: Nonspecific small lymph nodes measuring up to 9 mm short axis diameter at the gastroesophageal junction. An abnormal number of small mediastinal lymph nodes and a borderline enlarged 12 x 16 mm noncalcified right paratracheal lymph node with normal fatty hilum.  Lungs/Pleura: No pleural fluid. No pneumonia or pulmonary edema. No pneumothorax. A small portion of the bilateral posterior costophrenic angles are incompletely imaged / evaluated. No suspicious pulmonary nodule in the imaged portions of the lungs.  Upper Abdomen: No acute abnormality.  Musculoskeletal: Diffuse bone demineralization and moderate degenerative changes. Healed right rib fracture deformities.  Review of the MIP images confirms the above findings.  I discussed critical results by telephone at the time of interpretation on 06/25/2019 at 18:33 pm with provider Sherwood Gambler , who verbally acknowledged these results.  IMPRESSION: Peripheral bilateral pulmonary emboli without right heart strain.  A nonspecific borderline enlarged right paratracheal noncalcified lymph node. Abnormal  number of small mediastinal lymph nodes, possibly reactive.  Incomplete imaging of the bilateral posterior costophrenic angles without obvious pleural effusion or acute pulmonary disease.   TTE IMPRESSIONS 06/08/2019 1. Left ventricular ejection fraction, by estimation, is 55 to 60%. The  left ventricle has normal function. The left ventricle has no regional  wall motion abnormalities. Left ventricular diastolic function could not  be evaluated.  2. Right ventricular systolic function was not well visualized. The right  ventricular size is normal. Tricuspid regurgitation signal is inadequate  for assessing PA pressure.  3. The mitral valve was not well visualized.  4. The aortic valve was not well visualized. Aortic valve regurgitation  is not visualized. Mild aortic valve sclerosis is present, with no  evidence of aortic valve stenosis.   FINDINGS  Left Ventricle: Left ventricular ejection fraction, by estimation, is 55  to 60%. The left ventricle has normal function. The left ventricle has no  regional wall motion abnormalities. Definity contrast agent was given IV  to delineate the left ventricular  endocardial borders. The left ventricular internal cavity size was normal  in size. There is no left ventricular hypertrophy.   Right Ventricle: The right ventricular size is normal. Right vetricular  wall thickness was not assessed. Right ventricular systolic function was  not well visualized. Tricuspid regurgitation signal is inadequate for  assessing PA pressure.   Left Atrium: Left atrial size was not well visualized.   Right Atrium: Right atrial size was not well visualized.   Pericardium: There is no evidence of pericardial effusion.   Mitral Valve: The mitral valve was not well visualized. Normal mobility of  the mitral valve leaflets.   Tricuspid Valve: The tricuspid valve is not well visualized. No evidence  of tricuspid stenosis.   Aortic Valve: The aortic  valve was not well visualized. Aortic valve  regurgitation is not visualized. Mild aortic valve sclerosis is present,  with no evidence of aortic valve stenosis.   Pulmonic Valve: The pulmonic valve was not well visualized. Pulmonic valve  regurgitation is not visualized.   Aorta: Aortic root could not be assessed and the aortic root was not well  visualized.   Venous: The inferior vena cava was not well visualized.   IAS/Shunts: No atrial level shunt detected by color flow Doppler.  Recent Labs: 02/27/2019: NT-Pro BNP 37 03/05/2019: TSH 0.809 06/08/2019: Magnesium 1.9 06/26/2019: ALT  25; B Natriuretic Peptide 66.1; BUN 23; Creatinine, Ser 1.03; Hemoglobin 11.2; Platelets 152; Potassium 3.7; Sodium 133  Recent Lipid Panel    Component Value Date/Time   CHOL 128 01/24/2019 1121   TRIG 161 (H) 03/07/2019 0721   HDL 42 01/24/2019 1121   CHOLHDL 3.0 01/24/2019 1121   LDLCALC 64 01/24/2019 1121    Physical Exam:    VS:  BP 126/66   Temp 98 F (36.7 C)   Ht 6' 10"  (2.083 m)   Wt (!) 383 lb 9.6 oz (174 kg)   SpO2 96%   BMI 40.11 kg/m      Wt Readings from Last 3 Encounters:  06/27/19 (!) 383 lb 9.6 oz (174 kg)  06/12/19 (!) 374 lb 3.2 oz (169.7 kg)  05/30/19 (!) 384 lb (174.2 kg)     GEN: Well nourished, well developed in no acute distress HEENT: Normal NECK: No JVD; No carotid bruits LYMPHATICS: No lymphadenopathy CARDIAC: S1S2 noted,RRR, no murmurs, rubs, gallops RESPIRATORY:  Clear to auscultation without rales, wheezing or rhonchi  ABDOMEN: Soft, non-tender, non-distended, +bowel sounds, no guarding. EXTREMITIES: No edema, No cyanosis, no clubbing MUSCULOSKELETAL:  No deformity  SKIN: Warm and dry NEUROLOGIC:  Alert and oriented x 3, non-focal PSYCHIATRIC:  Normal affect, good insight  ASSESSMENT:    1. Chronic heart failure with preserved ejection fraction ([place])   2. Essential hypertension   3. Single subsegmental pulmonary embolism without acute cor  pulmonale (HCC)   4. Gastrointestinal hemorrhage associated with gastric ulcer   5. Morbid obesity with BMI of 40.0-44.9, adult (HCC)    PLAN:     HFpEF -he appears to be improved clinically on his volume status.  He is currently on Lasix 80 mg in the a.m., 40 mg in the p.m. and he takes 5 mg of Zaroxolyn prior to his first Lasix dose.  This regimen appears to be working for him therefore I am going to continue him on this regimen for now.  Hypertension-his blood pressure is appropriate no changes will be made in his regimen.  Acute pulmonary embolism-the patient is currently on Eliquis 5 mg twice daily for his PE.  This will be managed by his PCP and hematology on when to stop his anticoagulation.  In the meantime recommend continue to monitor Patient closely as he has had recent GI bleeding and now he is on anticoagulation.  GI bleeding-recent GI problem.  Now on anticoagulation due to recent diagnosis of acute pulmonary embolism.  We will continue to monitor patient closely for any signs of bleeding and repeat his CBC serially.  He will follow up with GI in the meantime as well.  Morbid obesity-he is trying to exercise but has had many limitations with all of his hospitalizations.  Plans to start a new diet and increase his physical activity even though this is hard as he is using a walker at this time.  The patient is in agreement with the above plan. The patient left the office in stable condition.  The patient will follow up in 3 months or sooner if needed.   Medication Adjustments/Labs and Tests Ordered: Current medicines are reviewed at length with the patient today.  Concerns regarding medicines are outlined above.  No orders of the defined types were placed in this encounter.  No orders of the defined types were placed in this encounter.   Patient Instructions  Medication Instructions:  Your physician recommends that you continue on your current medications as directed. Please  refer to the Current Medication list given to you today.  *If you need a refill on your cardiac medications before your next appointment, please call your pharmacy*   Lab Work: NONE. If you have labs (blood work) drawn today and your tests are completely normal, you will receive your results only by: Marland Kitchen MyChart Message (if you have MyChart) OR . A paper copy in the mail If you have any lab test that is abnormal or we need to change your treatment, we will call you to review the results.   Testing/Procedures: NONE.   Follow-Up: At [facility], you and your health needs are our priority.  As part of our continuing mission to provide you with exceptional heart care, we have created designated Provider Care Teams.  These Care Teams include your primary Cardiologist (physician) and Advanced Practice Providers (APPs -  Physician Assistants and Nurse Practitioners) who all work together to provide you with the care you need, when you need it.  We recommend signing up for the patient portal called "MyChart".  Sign up information is provided on this After Visit Summary.  MyChart is used to connect with patients for Virtual Visits (Telemedicine).  Patients are able to view lab/test results, encounter notes, upcoming appointments, etc.  Non-urgent messages can be sent to your provider as well.   To learn more about what you can do with MyChart, go to [email or web address].    Your next appointment:   3 month(s)  The format for your next appointment:   In Person  Provider:   Berniece Salines, DO   Other Instructions      Adopting a Healthy Lifestyle.  Know what a healthy weight is for you (roughly BMI <25) and aim to maintain this   Aim for 7+ servings of fruits and vegetables daily   65-80+ fluid ounces of water or unsweet tea for healthy kidneys   Limit to max 1 drink of alcohol per day; avoid smoking/tobacco   Limit animal fats in diet for cholesterol and heart health - choose  grass fed whenever available   Avoid highly processed foods, and foods high in saturated/trans fats   Aim for low stress - take time to unwind and care for your mental health   Aim for 150 min of moderate intensity exercise weekly for heart health, and weights twice weekly for bone health   Aim for 7-9 hours of sleep daily   When it comes to diets, agreement about the perfect plan isnt easy to find, even among the experts. Experts at the [employer] developed an idea known as the Healthy Eating Plate. Just imagine a plate divided into logical, healthy portions.   The emphasis is on diet quality:   Load up on vegetables and fruits - one-half of your plate: Aim for color and variety, and remember that potatoes dont count.   Go for whole grains - one-quarter of your plate: Whole wheat, barley, wheat berries, quinoa, oats, brown rice, and foods made with them. If you want pasta, go with whole wheat pasta.   Protein power - one-quarter of your plate: Fish, chicken, beans, and nuts are all healthy, versatile protein sources. Limit red meat.   The diet, however, does go beyond the plate, offering a few other suggestions.   Use healthy plant oils, such as olive, canola, soy, corn, sunflower and peanut. Check the labels, and avoid partially hydrogenated oil, which have unhealthy trans fats.   If youre thirsty, drink water.  Coffee and tea are good in moderation, but skip sugary drinks and limit milk and dairy products to one or two daily servings.   The type of carbohydrate in the diet is more important than the amount. Some sources of carbohydrates, such as vegetables, fruits, whole grains, and beans-are healthier than others.   Finally, stay active  Signed, Berniece Salines, DO  06/27/2019 8:48 PM     [facility]

## 2019-06-27 NOTE — Patient Instructions (Signed)
Medication Instructions:  Your physician recommends that you continue on your current medications as directed. Please refer to the Current Medication list given to you today.  *If you need a refill on your cardiac medications before your next appointment, please call your pharmacy*   Lab Work: NONE. If you have labs (blood work) drawn today and your tests are completely normal, you will receive your results only by: Marland Kitchen MyChart Message (if you have MyChart) OR . A paper copy in the mail If you have any lab test that is abnormal or we need to change your treatment, we will call you to review the results.   Testing/Procedures: NONE.   Follow-Up: At [facility], you and your health needs are our priority.  As part of our continuing mission to provide you with exceptional heart care, we have created designated Provider Care Teams.  These Care Teams include your primary Cardiologist (physician) and Advanced Practice Providers (APPs -  Physician Assistants and Nurse Practitioners) who all work together to provide you with the care you need, when you need it.  We recommend signing up for the patient portal called "MyChart".  Sign up information is provided on this After Visit Summary.  MyChart is used to connect with patients for Virtual Visits (Telemedicine).  Patients are able to view lab/test results, encounter notes, upcoming appointments, etc.  Non-urgent messages can be sent to your provider as well.   To learn more about what you can do with MyChart, go to [email or web address].    Your next appointment:   3 month(s)  The format for your next appointment:   In Person  Provider:   Berniece Salines, DO   Other Instructions

## 2019-06-28 DIAGNOSIS — D696 Thrombocytopenia, unspecified: Secondary | ICD-10-CM | POA: Diagnosis not present

## 2019-06-28 DIAGNOSIS — I110 Hypertensive heart disease with heart failure: Secondary | ICD-10-CM | POA: Diagnosis not present

## 2019-06-28 DIAGNOSIS — I5030 Unspecified diastolic (congestive) heart failure: Secondary | ICD-10-CM | POA: Diagnosis not present

## 2019-06-28 DIAGNOSIS — K7581 Nonalcoholic steatohepatitis (NASH): Secondary | ICD-10-CM | POA: Diagnosis not present

## 2019-06-28 DIAGNOSIS — I2699 Other pulmonary embolism without acute cor pulmonale: Secondary | ICD-10-CM | POA: Diagnosis not present

## 2019-06-28 DIAGNOSIS — I82402 Acute embolism and thrombosis of unspecified deep veins of left lower extremity: Secondary | ICD-10-CM | POA: Diagnosis not present

## 2019-06-30 DIAGNOSIS — K7581 Nonalcoholic steatohepatitis (NASH): Secondary | ICD-10-CM | POA: Diagnosis not present

## 2019-06-30 DIAGNOSIS — I82402 Acute embolism and thrombosis of unspecified deep veins of left lower extremity: Secondary | ICD-10-CM | POA: Diagnosis not present

## 2019-06-30 DIAGNOSIS — I2699 Other pulmonary embolism without acute cor pulmonale: Secondary | ICD-10-CM | POA: Diagnosis not present

## 2019-06-30 DIAGNOSIS — I110 Hypertensive heart disease with heart failure: Secondary | ICD-10-CM | POA: Diagnosis not present

## 2019-06-30 DIAGNOSIS — D696 Thrombocytopenia, unspecified: Secondary | ICD-10-CM | POA: Diagnosis not present

## 2019-06-30 DIAGNOSIS — I5030 Unspecified diastolic (congestive) heart failure: Secondary | ICD-10-CM | POA: Diagnosis not present

## 2019-07-04 DIAGNOSIS — I82402 Acute embolism and thrombosis of unspecified deep veins of left lower extremity: Secondary | ICD-10-CM | POA: Diagnosis not present

## 2019-07-04 DIAGNOSIS — D696 Thrombocytopenia, unspecified: Secondary | ICD-10-CM | POA: Diagnosis not present

## 2019-07-04 DIAGNOSIS — I2699 Other pulmonary embolism without acute cor pulmonale: Secondary | ICD-10-CM | POA: Diagnosis not present

## 2019-07-04 DIAGNOSIS — I5030 Unspecified diastolic (congestive) heart failure: Secondary | ICD-10-CM | POA: Diagnosis not present

## 2019-07-04 DIAGNOSIS — K7581 Nonalcoholic steatohepatitis (NASH): Secondary | ICD-10-CM | POA: Diagnosis not present

## 2019-07-04 DIAGNOSIS — I110 Hypertensive heart disease with heart failure: Secondary | ICD-10-CM | POA: Diagnosis not present

## 2019-07-06 DIAGNOSIS — I110 Hypertensive heart disease with heart failure: Secondary | ICD-10-CM | POA: Diagnosis not present

## 2019-07-06 DIAGNOSIS — I2699 Other pulmonary embolism without acute cor pulmonale: Secondary | ICD-10-CM | POA: Diagnosis not present

## 2019-07-06 DIAGNOSIS — I5030 Unspecified diastolic (congestive) heart failure: Secondary | ICD-10-CM | POA: Diagnosis not present

## 2019-07-06 DIAGNOSIS — D696 Thrombocytopenia, unspecified: Secondary | ICD-10-CM | POA: Diagnosis not present

## 2019-07-06 DIAGNOSIS — I82402 Acute embolism and thrombosis of unspecified deep veins of left lower extremity: Secondary | ICD-10-CM | POA: Diagnosis not present

## 2019-07-06 DIAGNOSIS — K7581 Nonalcoholic steatohepatitis (NASH): Secondary | ICD-10-CM | POA: Diagnosis not present

## 2019-07-07 DIAGNOSIS — I5030 Unspecified diastolic (congestive) heart failure: Secondary | ICD-10-CM | POA: Diagnosis not present

## 2019-07-07 DIAGNOSIS — D696 Thrombocytopenia, unspecified: Secondary | ICD-10-CM | POA: Diagnosis not present

## 2019-07-07 DIAGNOSIS — I110 Hypertensive heart disease with heart failure: Secondary | ICD-10-CM | POA: Diagnosis not present

## 2019-07-07 DIAGNOSIS — K7581 Nonalcoholic steatohepatitis (NASH): Secondary | ICD-10-CM | POA: Diagnosis not present

## 2019-07-07 DIAGNOSIS — I82402 Acute embolism and thrombosis of unspecified deep veins of left lower extremity: Secondary | ICD-10-CM | POA: Diagnosis not present

## 2019-07-07 DIAGNOSIS — I2699 Other pulmonary embolism without acute cor pulmonale: Secondary | ICD-10-CM | POA: Diagnosis not present

## 2019-07-11 DIAGNOSIS — I82402 Acute embolism and thrombosis of unspecified deep veins of left lower extremity: Secondary | ICD-10-CM | POA: Diagnosis not present

## 2019-07-11 DIAGNOSIS — D696 Thrombocytopenia, unspecified: Secondary | ICD-10-CM | POA: Diagnosis not present

## 2019-07-11 DIAGNOSIS — I5030 Unspecified diastolic (congestive) heart failure: Secondary | ICD-10-CM | POA: Diagnosis not present

## 2019-07-11 DIAGNOSIS — K7581 Nonalcoholic steatohepatitis (NASH): Secondary | ICD-10-CM | POA: Diagnosis not present

## 2019-07-11 DIAGNOSIS — I2699 Other pulmonary embolism without acute cor pulmonale: Secondary | ICD-10-CM | POA: Diagnosis not present

## 2019-07-11 DIAGNOSIS — I110 Hypertensive heart disease with heart failure: Secondary | ICD-10-CM | POA: Diagnosis not present

## 2019-07-13 DIAGNOSIS — I82402 Acute embolism and thrombosis of unspecified deep veins of left lower extremity: Secondary | ICD-10-CM | POA: Diagnosis not present

## 2019-07-13 DIAGNOSIS — K7581 Nonalcoholic steatohepatitis (NASH): Secondary | ICD-10-CM | POA: Diagnosis not present

## 2019-07-13 DIAGNOSIS — D696 Thrombocytopenia, unspecified: Secondary | ICD-10-CM | POA: Diagnosis not present

## 2019-07-13 DIAGNOSIS — I110 Hypertensive heart disease with heart failure: Secondary | ICD-10-CM | POA: Diagnosis not present

## 2019-07-13 DIAGNOSIS — I5030 Unspecified diastolic (congestive) heart failure: Secondary | ICD-10-CM | POA: Diagnosis not present

## 2019-07-13 DIAGNOSIS — I2699 Other pulmonary embolism without acute cor pulmonale: Secondary | ICD-10-CM | POA: Diagnosis not present

## 2019-07-17 DIAGNOSIS — I110 Hypertensive heart disease with heart failure: Secondary | ICD-10-CM | POA: Diagnosis not present

## 2019-07-17 DIAGNOSIS — I82402 Acute embolism and thrombosis of unspecified deep veins of left lower extremity: Secondary | ICD-10-CM | POA: Diagnosis not present

## 2019-07-17 DIAGNOSIS — D696 Thrombocytopenia, unspecified: Secondary | ICD-10-CM | POA: Diagnosis not present

## 2019-07-17 DIAGNOSIS — I5030 Unspecified diastolic (congestive) heart failure: Secondary | ICD-10-CM | POA: Diagnosis not present

## 2019-07-17 DIAGNOSIS — K7581 Nonalcoholic steatohepatitis (NASH): Secondary | ICD-10-CM | POA: Diagnosis not present

## 2019-07-17 DIAGNOSIS — I2699 Other pulmonary embolism without acute cor pulmonale: Secondary | ICD-10-CM | POA: Diagnosis not present

## 2019-07-21 DIAGNOSIS — I5030 Unspecified diastolic (congestive) heart failure: Secondary | ICD-10-CM | POA: Diagnosis not present

## 2019-07-21 DIAGNOSIS — I110 Hypertensive heart disease with heart failure: Secondary | ICD-10-CM | POA: Diagnosis not present

## 2019-07-21 DIAGNOSIS — K7581 Nonalcoholic steatohepatitis (NASH): Secondary | ICD-10-CM | POA: Diagnosis not present

## 2019-07-21 DIAGNOSIS — D696 Thrombocytopenia, unspecified: Secondary | ICD-10-CM | POA: Diagnosis not present

## 2019-07-21 DIAGNOSIS — I82402 Acute embolism and thrombosis of unspecified deep veins of left lower extremity: Secondary | ICD-10-CM | POA: Diagnosis not present

## 2019-07-21 DIAGNOSIS — I2699 Other pulmonary embolism without acute cor pulmonale: Secondary | ICD-10-CM | POA: Diagnosis not present

## 2019-07-22 DIAGNOSIS — K7581 Nonalcoholic steatohepatitis (NASH): Secondary | ICD-10-CM | POA: Diagnosis not present

## 2019-07-22 DIAGNOSIS — G4700 Insomnia, unspecified: Secondary | ICD-10-CM | POA: Diagnosis not present

## 2019-07-22 DIAGNOSIS — D696 Thrombocytopenia, unspecified: Secondary | ICD-10-CM | POA: Diagnosis not present

## 2019-07-22 DIAGNOSIS — D649 Anemia, unspecified: Secondary | ICD-10-CM | POA: Diagnosis not present

## 2019-07-22 DIAGNOSIS — G894 Chronic pain syndrome: Secondary | ICD-10-CM | POA: Diagnosis not present

## 2019-07-22 DIAGNOSIS — Z7901 Long term (current) use of anticoagulants: Secondary | ICD-10-CM | POA: Diagnosis not present

## 2019-07-22 DIAGNOSIS — I82402 Acute embolism and thrombosis of unspecified deep veins of left lower extremity: Secondary | ICD-10-CM | POA: Diagnosis not present

## 2019-07-22 DIAGNOSIS — E785 Hyperlipidemia, unspecified: Secondary | ICD-10-CM | POA: Diagnosis not present

## 2019-07-22 DIAGNOSIS — K219 Gastro-esophageal reflux disease without esophagitis: Secondary | ICD-10-CM | POA: Diagnosis not present

## 2019-07-22 DIAGNOSIS — I5030 Unspecified diastolic (congestive) heart failure: Secondary | ICD-10-CM | POA: Diagnosis not present

## 2019-07-22 DIAGNOSIS — I110 Hypertensive heart disease with heart failure: Secondary | ICD-10-CM | POA: Diagnosis not present

## 2019-07-22 DIAGNOSIS — J45909 Unspecified asthma, uncomplicated: Secondary | ICD-10-CM | POA: Diagnosis not present

## 2019-07-22 DIAGNOSIS — I2699 Other pulmonary embolism without acute cor pulmonale: Secondary | ICD-10-CM | POA: Diagnosis not present

## 2019-07-22 DIAGNOSIS — M5126 Other intervertebral disc displacement, lumbar region: Secondary | ICD-10-CM | POA: Diagnosis not present

## 2019-07-22 DIAGNOSIS — Z6841 Body Mass Index (BMI) 40.0 and over, adult: Secondary | ICD-10-CM | POA: Diagnosis not present

## 2019-07-22 DIAGNOSIS — Z9181 History of falling: Secondary | ICD-10-CM | POA: Diagnosis not present

## 2019-07-26 DIAGNOSIS — I5030 Unspecified diastolic (congestive) heart failure: Secondary | ICD-10-CM | POA: Diagnosis not present

## 2019-07-26 DIAGNOSIS — I2699 Other pulmonary embolism without acute cor pulmonale: Secondary | ICD-10-CM | POA: Diagnosis not present

## 2019-07-26 DIAGNOSIS — M5126 Other intervertebral disc displacement, lumbar region: Secondary | ICD-10-CM | POA: Diagnosis not present

## 2019-07-26 DIAGNOSIS — I110 Hypertensive heart disease with heart failure: Secondary | ICD-10-CM | POA: Diagnosis not present

## 2019-07-26 DIAGNOSIS — I82402 Acute embolism and thrombosis of unspecified deep veins of left lower extremity: Secondary | ICD-10-CM | POA: Diagnosis not present

## 2019-07-26 DIAGNOSIS — G894 Chronic pain syndrome: Secondary | ICD-10-CM | POA: Diagnosis not present

## 2019-07-27 DIAGNOSIS — I2699 Other pulmonary embolism without acute cor pulmonale: Secondary | ICD-10-CM | POA: Diagnosis not present

## 2019-07-27 DIAGNOSIS — I5030 Unspecified diastolic (congestive) heart failure: Secondary | ICD-10-CM | POA: Diagnosis not present

## 2019-07-27 DIAGNOSIS — M5126 Other intervertebral disc displacement, lumbar region: Secondary | ICD-10-CM | POA: Diagnosis not present

## 2019-07-27 DIAGNOSIS — G894 Chronic pain syndrome: Secondary | ICD-10-CM | POA: Diagnosis not present

## 2019-07-27 DIAGNOSIS — I110 Hypertensive heart disease with heart failure: Secondary | ICD-10-CM | POA: Diagnosis not present

## 2019-07-27 DIAGNOSIS — I82402 Acute embolism and thrombosis of unspecified deep veins of left lower extremity: Secondary | ICD-10-CM | POA: Diagnosis not present

## 2019-07-28 DIAGNOSIS — M5126 Other intervertebral disc displacement, lumbar region: Secondary | ICD-10-CM | POA: Diagnosis not present

## 2019-07-28 DIAGNOSIS — I110 Hypertensive heart disease with heart failure: Secondary | ICD-10-CM | POA: Diagnosis not present

## 2019-07-28 DIAGNOSIS — I5030 Unspecified diastolic (congestive) heart failure: Secondary | ICD-10-CM | POA: Diagnosis not present

## 2019-07-28 DIAGNOSIS — I82402 Acute embolism and thrombosis of unspecified deep veins of left lower extremity: Secondary | ICD-10-CM | POA: Diagnosis not present

## 2019-07-28 DIAGNOSIS — G894 Chronic pain syndrome: Secondary | ICD-10-CM | POA: Diagnosis not present

## 2019-07-28 DIAGNOSIS — I2699 Other pulmonary embolism without acute cor pulmonale: Secondary | ICD-10-CM | POA: Diagnosis not present

## 2019-07-31 DIAGNOSIS — I110 Hypertensive heart disease with heart failure: Secondary | ICD-10-CM | POA: Diagnosis not present

## 2019-07-31 DIAGNOSIS — I5030 Unspecified diastolic (congestive) heart failure: Secondary | ICD-10-CM | POA: Diagnosis not present

## 2019-07-31 DIAGNOSIS — I82402 Acute embolism and thrombosis of unspecified deep veins of left lower extremity: Secondary | ICD-10-CM | POA: Diagnosis not present

## 2019-07-31 DIAGNOSIS — I2699 Other pulmonary embolism without acute cor pulmonale: Secondary | ICD-10-CM | POA: Diagnosis not present

## 2019-07-31 DIAGNOSIS — G894 Chronic pain syndrome: Secondary | ICD-10-CM | POA: Diagnosis not present

## 2019-07-31 DIAGNOSIS — M5126 Other intervertebral disc displacement, lumbar region: Secondary | ICD-10-CM | POA: Diagnosis not present

## 2019-08-01 DIAGNOSIS — D696 Thrombocytopenia, unspecified: Secondary | ICD-10-CM | POA: Diagnosis not present

## 2019-08-01 DIAGNOSIS — R5381 Other malaise: Secondary | ICD-10-CM | POA: Diagnosis not present

## 2019-08-01 DIAGNOSIS — I110 Hypertensive heart disease with heart failure: Secondary | ICD-10-CM | POA: Diagnosis not present

## 2019-08-01 DIAGNOSIS — I82402 Acute embolism and thrombosis of unspecified deep veins of left lower extremity: Secondary | ICD-10-CM | POA: Diagnosis not present

## 2019-08-01 DIAGNOSIS — G894 Chronic pain syndrome: Secondary | ICD-10-CM | POA: Diagnosis not present

## 2019-08-01 DIAGNOSIS — I2699 Other pulmonary embolism without acute cor pulmonale: Secondary | ICD-10-CM | POA: Diagnosis not present

## 2019-08-01 DIAGNOSIS — I5030 Unspecified diastolic (congestive) heart failure: Secondary | ICD-10-CM | POA: Diagnosis not present

## 2019-08-01 DIAGNOSIS — M5126 Other intervertebral disc displacement, lumbar region: Secondary | ICD-10-CM | POA: Diagnosis not present

## 2019-08-04 ENCOUNTER — Telehealth: Payer: Self-pay | Admitting: Cardiology

## 2019-08-04 DIAGNOSIS — G894 Chronic pain syndrome: Secondary | ICD-10-CM | POA: Diagnosis not present

## 2019-08-04 DIAGNOSIS — I2699 Other pulmonary embolism without acute cor pulmonale: Secondary | ICD-10-CM | POA: Diagnosis not present

## 2019-08-04 DIAGNOSIS — I110 Hypertensive heart disease with heart failure: Secondary | ICD-10-CM | POA: Diagnosis not present

## 2019-08-04 DIAGNOSIS — M5126 Other intervertebral disc displacement, lumbar region: Secondary | ICD-10-CM | POA: Diagnosis not present

## 2019-08-04 DIAGNOSIS — I5030 Unspecified diastolic (congestive) heart failure: Secondary | ICD-10-CM | POA: Diagnosis not present

## 2019-08-04 DIAGNOSIS — I82402 Acute embolism and thrombosis of unspecified deep veins of left lower extremity: Secondary | ICD-10-CM | POA: Diagnosis not present

## 2019-08-04 NOTE — Telephone Encounter (Signed)
Spoke to patient and he states that he feels like he can not catch his breath. He also can not lay/sit down because of this SOB.  He states that his feet and ankles are very swollen and his stomach is swollen as well. He has gained 9 lbs over the last two days. He said that he has taken his Lasix and Metolazone every day as prescribed. He does not have a current list of his vital signs but he said he has taken them and they were all fine. He is not in a place where he can take them for me now.   I spoke with Dr. Geraldo Pitter and let him know what was going on. Dr. Geraldo Pitter has advised that he proceeds to the ED to get this taken care of.   I let the patient know these recommendations and he states that he is going to head to the ED at this time.

## 2019-08-04 NOTE — Telephone Encounter (Signed)
  Pt c/o swelling: STAT is pt has developed SOB within 24 hours  1) How much weight have you gained and in what time span? From 375 to 384 over a couple of days  2) If swelling, where is the swelling located? Feet and ankles, possibly stomach  3) Are you currently taking a fluid pill? yes  4) Are you currently SOB? Yes, patient states it comes and goes most of the time but today it has been pretty constant. He says he cannot breathe when he lays down.   5) Do you have a log of your daily weights (if so, list)? Most of the time  6) Have you gained 3 pounds in a day or 5 pounds in a week? yes  7) Have you traveled recently? No  Attempted to

## 2019-08-06 ENCOUNTER — Encounter (HOSPITAL_COMMUNITY): Payer: Self-pay | Admitting: Emergency Medicine

## 2019-08-06 ENCOUNTER — Inpatient Hospital Stay (HOSPITAL_COMMUNITY)
Admission: EM | Admit: 2019-08-06 | Discharge: 2019-08-12 | DRG: 287 | Disposition: A | Discharge status: Home Health | Payer: Medicare Other | Attending: Cardiovascular Disease | Admitting: Cardiovascular Disease

## 2019-08-06 ENCOUNTER — Emergency Department (HOSPITAL_COMMUNITY): Payer: Medicare Other

## 2019-08-06 DIAGNOSIS — I50813 Acute on chronic right heart failure: Secondary | ICD-10-CM | POA: Diagnosis present

## 2019-08-06 DIAGNOSIS — Z83438 Family history of other disorder of lipoprotein metabolism and other lipidemia: Secondary | ICD-10-CM

## 2019-08-06 DIAGNOSIS — Z6841 Body Mass Index (BMI) 40.0 and over, adult: Secondary | ICD-10-CM

## 2019-08-06 DIAGNOSIS — R251 Tremor, unspecified: Secondary | ICD-10-CM

## 2019-08-06 DIAGNOSIS — R0602 Shortness of breath: Principal | ICD-10-CM

## 2019-08-06 DIAGNOSIS — G4733 Obstructive sleep apnea (adult) (pediatric): Secondary | ICD-10-CM

## 2019-08-06 DIAGNOSIS — Z86711 Personal history of pulmonary embolism: Secondary | ICD-10-CM

## 2019-08-06 DIAGNOSIS — D500 Iron deficiency anemia secondary to blood loss (chronic): Secondary | ICD-10-CM | POA: Diagnosis not present

## 2019-08-06 DIAGNOSIS — Z880 Allergy status to penicillin: Secondary | ICD-10-CM

## 2019-08-06 DIAGNOSIS — K3189 Other diseases of stomach and duodenum: Secondary | ICD-10-CM | POA: Diagnosis present

## 2019-08-06 DIAGNOSIS — E876 Hypokalemia: Secondary | ICD-10-CM | POA: Diagnosis present

## 2019-08-06 DIAGNOSIS — D509 Iron deficiency anemia, unspecified: Secondary | ICD-10-CM | POA: Diagnosis present

## 2019-08-06 DIAGNOSIS — I471 Supraventricular tachycardia: Secondary | ICD-10-CM | POA: Diagnosis present

## 2019-08-06 DIAGNOSIS — I5033 Acute on chronic diastolic (congestive) heart failure: Secondary | ICD-10-CM | POA: Diagnosis not present

## 2019-08-06 DIAGNOSIS — I2781 Cor pulmonale (chronic): Secondary | ICD-10-CM | POA: Diagnosis not present

## 2019-08-06 DIAGNOSIS — D638 Anemia in other chronic diseases classified elsewhere: Secondary | ICD-10-CM | POA: Diagnosis present

## 2019-08-06 DIAGNOSIS — Z79899 Other long term (current) drug therapy: Secondary | ICD-10-CM | POA: Diagnosis not present

## 2019-08-06 DIAGNOSIS — E785 Hyperlipidemia, unspecified: Secondary | ICD-10-CM | POA: Diagnosis present

## 2019-08-06 DIAGNOSIS — Z8249 Family history of ischemic heart disease and other diseases of the circulatory system: Secondary | ICD-10-CM

## 2019-08-06 DIAGNOSIS — I272 Pulmonary hypertension, unspecified: Secondary | ICD-10-CM | POA: Diagnosis not present

## 2019-08-06 DIAGNOSIS — I110 Hypertensive heart disease with heart failure: Principal | ICD-10-CM | POA: Diagnosis present

## 2019-08-06 DIAGNOSIS — Z7901 Long term (current) use of anticoagulants: Secondary | ICD-10-CM | POA: Diagnosis not present

## 2019-08-06 DIAGNOSIS — I4719 Other supraventricular tachycardia: Secondary | ICD-10-CM

## 2019-08-06 DIAGNOSIS — G894 Chronic pain syndrome: Secondary | ICD-10-CM | POA: Diagnosis present

## 2019-08-06 DIAGNOSIS — R457 State of emotional shock and stress, unspecified: Secondary | ICD-10-CM | POA: Diagnosis not present

## 2019-08-06 DIAGNOSIS — G4700 Insomnia, unspecified: Secondary | ICD-10-CM | POA: Diagnosis present

## 2019-08-06 DIAGNOSIS — K7460 Unspecified cirrhosis of liver: Secondary | ICD-10-CM | POA: Diagnosis present

## 2019-08-06 DIAGNOSIS — R069 Unspecified abnormalities of breathing: Secondary | ICD-10-CM | POA: Diagnosis not present

## 2019-08-06 DIAGNOSIS — K7581 Nonalcoholic steatohepatitis (NASH): Secondary | ICD-10-CM | POA: Diagnosis present

## 2019-08-06 DIAGNOSIS — Z20822 Contact with and (suspected) exposure to covid-19: Secondary | ICD-10-CM | POA: Diagnosis present

## 2019-08-06 DIAGNOSIS — N179 Acute kidney failure, unspecified: Secondary | ICD-10-CM | POA: Diagnosis present

## 2019-08-06 DIAGNOSIS — I509 Heart failure, unspecified: Secondary | ICD-10-CM | POA: Diagnosis not present

## 2019-08-06 HISTORY — DX: Acute on chronic right heart failure: I50.813

## 2019-08-06 LAB — BASIC METABOLIC PANEL
Anion gap: 16 — ABNORMAL HIGH (ref 5–15)
BUN: 17 mg/dL (ref 8–23)
CO2: 30 mmol/L (ref 22–32)
Calcium: 9.8 mg/dL (ref 8.9–10.3)
Chloride: 89 mmol/L — ABNORMAL LOW (ref 98–111)
Creatinine, Ser: 1.11 mg/dL (ref 0.61–1.24)
GFR calc Af Amer: >60 mL/min (ref >60)
GFR calc non Af Amer: >60 mL/min (ref >60)
Glucose, Bld: 119 mg/dL — ABNORMAL HIGH (ref 70–99)
Potassium: 2.9 mmol/L — ABNORMAL LOW (ref 3.5–5.1)
Sodium: 135 mmol/L (ref 135–145)

## 2019-08-06 LAB — D-DIMER, QUANTITATIVE: D-Dimer, Quant: <0.27 ug/mL-FEU (ref 0.00–0.50)

## 2019-08-06 LAB — CBC
HCT: 39.6 % (ref 39.0–52.0)
Hemoglobin: 12.1 g/dL — ABNORMAL LOW (ref 13.0–17.0)
MCH: 24.1 pg — ABNORMAL LOW (ref 26.0–34.0)
MCHC: 30.6 g/dL (ref 30.0–36.0)
MCV: 78.9 fL — ABNORMAL LOW (ref 80.0–100.0)
Platelets: 166 10*3/uL (ref 150–400)
RBC: 5.02 MIL/uL (ref 4.22–5.81)
RDW: 15.9 % — ABNORMAL HIGH (ref 11.5–15.5)
WBC: 9.3 10*3/uL (ref 4.0–10.5)
nRBC: 0 % (ref 0.0–0.2)

## 2019-08-06 LAB — BRAIN NATRIURETIC PEPTIDE: B Natriuretic Peptide: 13.7 pg/mL (ref 0.0–100.0)

## 2019-08-06 LAB — RESPIRATORY PANEL BY RT PCR (FLU A&B, COVID)
Influenza A by PCR: NEGATIVE
Influenza B by PCR: NEGATIVE
SARS Coronavirus 2 by RT PCR: NEGATIVE

## 2019-08-06 LAB — TROPONIN I (HIGH SENSITIVITY): Troponin I (High Sensitivity): 5 ng/L (ref <18)

## 2019-08-06 MED ORDER — FUROSEMIDE 10 MG/ML IJ SOLN
80.0000 mg | INTRAMUSCULAR | Status: AC
  Administered 2019-08-06: 80 mg via INTRAVENOUS
  Filled 2019-08-06: qty 8

## 2019-08-06 MED ORDER — ZOLPIDEM TARTRATE 5 MG PO TABS
10.0000 mg | ORAL_TABLET | Freq: Every evening | ORAL | Status: DC | PRN
  Administered 2019-08-06 – 2019-08-11 (×6): 10 mg via ORAL
  Filled 2019-08-06 (×6): qty 2

## 2019-08-06 MED ORDER — POTASSIUM CHLORIDE 10 MEQ/100ML IV SOLN
10.0000 meq | Freq: Once | INTRAVENOUS | Status: AC
  Administered 2019-08-06: 14:00:00 10 meq via INTRAVENOUS
  Filled 2019-08-06: qty 100

## 2019-08-06 MED ORDER — APIXABAN 5 MG PO TABS
5.0000 mg | ORAL_TABLET | Freq: Two times a day (BID) | ORAL | Status: DC
  Administered 2019-08-06 – 2019-08-08 (×5): 5 mg via ORAL
  Filled 2019-08-06 (×6): qty 1

## 2019-08-06 MED ORDER — PANTOPRAZOLE SODIUM 40 MG PO TBEC
40.0000 mg | DELAYED_RELEASE_TABLET | Freq: Two times a day (BID) | ORAL | Status: DC
  Administered 2019-08-06 – 2019-08-12 (×12): 40 mg via ORAL
  Filled 2019-08-06 (×12): qty 1

## 2019-08-06 MED ORDER — PRAVASTATIN SODIUM 10 MG PO TABS
20.0000 mg | ORAL_TABLET | Freq: Every day | ORAL | Status: DC
  Administered 2019-08-06 – 2019-08-12 (×7): 20 mg via ORAL
  Filled 2019-08-06 (×7): qty 2

## 2019-08-06 MED ORDER — POTASSIUM CHLORIDE CRYS ER 20 MEQ PO TBCR
40.0000 meq | EXTENDED_RELEASE_TABLET | Freq: Once | ORAL | Status: AC
  Administered 2019-08-06: 13:00:00 40 meq via ORAL
  Filled 2019-08-06: qty 2

## 2019-08-06 MED ORDER — NITROGLYCERIN 0.4 MG SL SUBL: 0.4000 mg | SUBLINGUAL_TABLET | SUBLINGUAL | Status: DC | PRN

## 2019-08-06 MED ORDER — ALBUTEROL SULFATE HFA 108 (90 BASE) MCG/ACT IN AERS
1.0000 | INHALATION_SPRAY | RESPIRATORY_TRACT | Status: DC | PRN
  Filled 2019-08-06: qty 6.7

## 2019-08-06 MED ORDER — FUROSEMIDE 10 MG/ML IJ SOLN
80.0000 mg | Freq: Two times a day (BID) | INTRAMUSCULAR | Status: DC
  Administered 2019-08-06 – 2019-08-07 (×2): 80 mg via INTRAVENOUS
  Filled 2019-08-06: qty 8

## 2019-08-06 MED ORDER — ACETAMINOPHEN 325 MG PO TABS
650.0000 mg | ORAL_TABLET | ORAL | Status: DC | PRN
  Administered 2019-08-07 – 2019-08-10 (×5): 650 mg via ORAL
  Filled 2019-08-06 (×5): qty 2

## 2019-08-06 MED ORDER — POTASSIUM CHLORIDE CRYS ER 20 MEQ PO TBCR
40.0000 meq | EXTENDED_RELEASE_TABLET | Freq: Two times a day (BID) | ORAL | Status: DC
  Administered 2019-08-06 – 2019-08-12 (×13): 40 meq via ORAL
  Filled 2019-08-06 (×13): qty 2

## 2019-08-06 MED ORDER — ONDANSETRON HCL 4 MG/2ML IJ SOLN
4.0000 mg | Freq: Four times a day (QID) | INTRAMUSCULAR | Status: DC | PRN
  Administered 2019-08-06: 21:00:00 4 mg via INTRAVENOUS
  Filled 2019-08-06: qty 2

## 2019-08-06 MED ORDER — GABAPENTIN 400 MG PO CAPS
800.0000 mg | ORAL_CAPSULE | Freq: Three times a day (TID) | ORAL | Status: DC
  Administered 2019-08-06 – 2019-08-07 (×3): 800 mg via ORAL
  Filled 2019-08-06 (×3): qty 2

## 2019-08-06 MED ORDER — POLYETHYLENE GLYCOL 3350 17 G PO PACK
17.0000 g | PACK | Freq: Every day | ORAL | Status: DC
  Administered 2019-08-07 – 2019-08-10 (×4): 17 g via ORAL
  Filled 2019-08-06 (×7): qty 1

## 2019-08-06 MED ORDER — SODIUM CHLORIDE 0.9% FLUSH
3.0000 mL | Freq: Once | INTRAVENOUS | Status: AC
  Administered 2019-08-10: 3 mL via INTRAVENOUS

## 2019-08-06 MED ORDER — LISINOPRIL 5 MG PO TABS
15.0000 mg | ORAL_TABLET | Freq: Every day | ORAL | Status: DC
  Administered 2019-08-06 – 2019-08-10 (×4): 15 mg via ORAL
  Filled 2019-08-06: qty 2
  Filled 2019-08-06: qty 1
  Filled 2019-08-06: qty 2
  Filled 2019-08-06: qty 1

## 2019-08-06 MED ORDER — BUPRENORPHINE HCL-NALOXONE HCL 8-2 MG SL SUBL
1.0000 | SUBLINGUAL_TABLET | Freq: Two times a day (BID) | SUBLINGUAL | Status: DC
  Administered 2019-08-06 – 2019-08-12 (×12): 1 via SUBLINGUAL
  Filled 2019-08-06 (×12): qty 1

## 2019-08-06 NOTE — H&P (Addendum)
Cardiology Admission History and Physical:   Patient ID: Charles Gray MRN: [id number]; DOB: [birth year]   Admission date: 08/06/2019  Primary Care Provider: Enid Skeens., MD Primary Cardiologist: Berniece Salines, DO  Primary Electrophysiologist:  None   Chief Complaint: Dyspnea and edema  Patient Profile:   Charles Gray is a 64 y.o. male with a history of chronic diastolic heart failure, obstructive sleep apnea, suspected cirrhosis of the liver, recent acute pulmonary embolism (February 2021), history of severe GI bleeding requiring transfusion (November 2020), hypertension, hyperlipidemia, morbid obesity, chronic pain due to lumbar spine spondyloarthrosis/herniated disks, chronic opiate use presenting to the emergency room due to edema and dyspnea refractory to home oral diuretics.  History of Present Illness:   Charles Gray has had rapidly worsening shortness of breath at rest over the last 3 days. Has been taking combination oral furosemide and metolazone for refractory edema and shortness of breath, without resolution of symptoms. He was therefore advised to go to the emergency room.   He has been sleeping upright for years so is hard to say whether he has worsening orthopnea. If he does try to lay horizontally, he wakes up in less than an hour. He has not done this recently. He has had worsening edema. He has been having alternating hot and cold sweats, but has not had fever and denies cough, hemoptysis, known sick contacts including no known coronavirus exposure. He has not had abdominal pain, nausea or vomiting and denies anginal or pleuritic chest pain. He has not had unilateral leg swelling or calf tenderness. He does have pain in the back of his neck and chronic pain in his lumbar spine. He complains of numbness in both feet, which is a longstanding issue. He estimates his current weight to be around 400 pounds, down from 410 pounds before he started the metolazone, but his  weight during the previous admission in February was 375 pounds.  He has had occasional palpitations, described as a throbbing sensation in his throat and associated with worsening dyspnea. He has noticed that his heart will often skip a beat if he eats something cold like ice cream. While in the emergency room he had an episode of atrial tachycardia, sustained but of uncertain duration (leads were not on when the arrhythmia began, roughly 140 bpm, ends abruptly with return to sinus rhythm at 80 bpm.  He has received intravenous furosemide in the emergency room and has excellent urine output so far. Of note he is markedly hypokalemic with a potassium of 2.9. Creatinine is normal at 1.1. His BNP is always in the normal range. He is borderline anemic (hemoglobin 12.1) with microcytic indices. He is respiratory panel for flu/coronavirus is pending at this time.  For many years he had episodes of "stopping breathing when I sleep" and waking himself up gasping for breath. It appears he had a sleep study at one point but was unable to tolerate CPAP.   In November 2020 he had a lengthy hospitalization for severe upper GI bleed, complicated by shock requiring endotracheal intubation and 3 units of PRBC transfusion. EGD showed blood in the stomach but the exact source of bleeding was uncertain. Work-up raise concern for nonalcoholic steatohepatitis with cirrhosis. He was hospitalized in February 2021 with acute pulmonary embolism and has been taking Eliquis 5 mg twice daily since then. He has not had any overt bleeding problems. In November, echo has shown hyperdynamic left ventricular systolic function. No comment was made regarding diastolic function but the values for  annular velocities and mitral inflow velocities suggest normal diastolic function, but the inferior vena cava was severely dilated. His BNP has been checked multiple times and is usually quite low.  He is originally from [place] and moved to  [place] about 3 years ago. 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 He lives independently, but not far from his son and grandchildren.   Past Medical History:  Diagnosis Date  . Acute respiratory failure with hypoxia ([place])   . Anemia of chronic disease   . Benign essential HTN   . Chronic heart failure with preserved ejection fraction ([place]) 05/30/2019  . Chronic pain syndrome   . Cirrhosis of liver without ascites ([place])   . Debility 03/13/2019  . Drug induced constipation   . Dyspnea   . Fatty liver disease, nonalcoholic   . GIB (gastrointestinal bleeding) 03/04/2019  . Hyperlipidemia 01/24/2019  . Hypertension 01/24/2019  . Hypoalbuminemia due to protein-calorie malnutrition ([place])   . Hypokalemia   . Hyponatremia   . Hypotension due to drugs   . Insomnia 01/24/2019  . Lumbar disc herniation 01/24/2019  . Morbid obesity with BMI of 40.0-44.9, adult ([place]) 05/30/2019  . Normocytic anemia 06/08/2019  . Peripheral edema   . Pulmonary emboli ([place]) 06/08/2019  . Pulmonary embolism ([place]) 06/08/2019  . Shock ([place])   . Thrombocytopenia ([place])     Past Surgical History:  Procedure Laterality Date  . ESOPHAGOGASTRODUODENOSCOPY (EGD) WITH PROPOFOL N/A 03/05/2019   Procedure: ESOPHAGOGASTRODUODENOSCOPY (EGD) WITH PROPOFOL;  Surgeon: Doran Stabler, MD;  Location: [facility];  Service: Gastroenterology;  Laterality: N/A;  Large Blood Clot Removed  . ESOPHAGOGASTRODUODENOSCOPY (EGD) WITH PROPOFOL N/A 03/06/2019   Procedure: ESOPHAGOGASTRODUODENOSCOPY (EGD) WITH PROPOFOL;  Surgeon: Thornton Park, MD;  Location: [facility];  Service: Gastroenterology;  Laterality: N/A;  . HEMORROIDECTOMY       Medications Prior to Admission: Prior to Admission medications   Medication Sig Start Date End Date Taking? Authorizing Provider  acetaminophen (TYLENOL) 325 MG tablet Take 2 tablets (650 mg total) by mouth every 4 (four) hours as needed for  fever or mild pain. 03/23/19  Yes Angiulli, Lavon Paganini, PA-C  apixaban (ELIQUIS) 5 MG TABS tablet Take 2 tablets (40m) twice daily for 7 days, then 1 tablet (570m twice daily 06/12/19  Yes Vann, Jessica U, DO  Buprenorphine HCl-Naloxone HCl 8-2 MG FILM Place 1 tablet under the tongue in the morning and at bedtime.    Yes [provider]  diphenhydrAMINE (BENADRYL) 25 MG tablet Take 25 mg by mouth every 6 (six) hours as needed for itching or allergies.   Yes [provider]  furosemide (LASIX) 40 MG tablet Take 1 tablet (40 mg total) by mouth at bedtime. 06/12/19  Yes Vann, Jessica U, DO  furosemide (LASIX) 80 MG tablet Take 1 tablet (80 mg total) by mouth daily. 06/12/19  Yes Vann, Jessica U, DO  gabapentin (NEURONTIN) 800 MG tablet Take 1 tablet (800 mg total) by mouth 3 (three) times daily. 03/23/19  Yes Angiulli, DaLavon PaganiniPA-C  lisinopril (ZESTRIL) 5 MG tablet Take 3 tablets (15 mg total) by mouth daily. Patient taking differently: Take 15 mg by mouth daily as needed (if BP isn't low).  03/23/19  Yes Angiulli, DaLavon PaganiniPA-C  metolazone (ZAROXOLYN) 5 MG tablet Take 1 tablet (5 mg total) by mouth every other day. 06/13/19  Yes Vann, Jessica U, DO  pantoprazole (PROTONIX) 40 MG tablet  Take 40 mg by mouth 2 (two) times daily.   Yes [provider]  polyethylene glycol (MIRALAX / GLYCOLAX) 17 g packet Take 17 g by mouth daily. Patient taking differently: Take 17 g by mouth daily as needed for mild constipation.  03/23/19  Yes Angiulli, Lavon Paganini, PA-C  potassium chloride SA (KLOR-CON) 20 MEQ tablet Take 20 mEq by mouth 2 (two) times daily.   Yes [provider]  pravastatin (PRAVACHOL) 20 MG tablet Take 1 tablet (20 mg total) by mouth daily. 03/23/19  Yes Angiulli, Lavon Paganini, PA-C  VENTOLIN HFA 108 (90 Base) MCG/ACT inhaler Inhale 1 puff into the lungs every 4 (four) hours as needed for shortness of breath. 03/23/19  Yes Angiulli, Lavon Paganini, PA-C  zolpidem (AMBIEN) 10 MG  tablet Take 10 mg by mouth at bedtime as needed for sleep.  05/19/19  Yes [provider]     Allergies:    Allergies  Allergen Reactions  . Penicillins     Did it involve swelling of the face/tongue/throat, SOB, or low BP? N/A Did it involve sudden or severe rash/hives, skin peeling, or any reaction on the inside of your mouth or nose? N/A Did you need to seek medical attention at a hospital or doctor's office?N/A When did it last happen?N/A If all above answers are "NO", may proceed with cephalosporin use.    Social History:   Social History   Socioeconomic History  . Marital status: Unknown    Spouse name: Not on file  . Number of children: Not on file  . Years of education: Not on file  . Highest education level: Not on file  Occupational History  . Not on file  Tobacco Use  . Smoking status: Never Smoker  . Smokeless tobacco: Never Used  Substance and Sexual Activity  . Alcohol use: Never  . Drug use: Not Currently    Types: Marijuana    Comment: Smoked for 30 years  . Sexual activity: Not on file  Other Topics Concern  . Not on file  Social History Narrative  . Not on file   Social Determinants of Health   Financial Resource Strain:   . Difficulty of Paying Living Expenses:   Food Insecurity:   . Worried About Charity fundraiser in the Last Year:   . Arboriculturist in the Last Year:   Transportation Needs:   . Film/video editor (Medical):   Marland Kitchen Lack of Transportation (Non-Medical):   Physical Activity:   . Days of Exercise per Week:   . Minutes of Exercise per Session:   Stress:   . Feeling of Stress :   Social Connections:   . Frequency of Communication with Friends and Family:   . Frequency of Social Gatherings with Friends and Family:   . Attends Religious Services:   . Active Member of Clubs or Organizations:   . Attends Archivist Meetings:   Marland Kitchen Marital Status:   Intimate Partner Violence:   . Fear of Current or  Ex-Partner:   . Emotionally Abused:   Marland Kitchen Physically Abused:   . Sexually Abused:     Family History:   The patient's family history includes CAD in his maternal grandfather and maternal grandmother; Hyperlipidemia in his mother; Hypertension in his mother; Stroke in his mother.    ROS:  Please see the history of present illness.  All other ROS reviewed and negative.     Physical Exam/Data:   Vitals:  08/06/19 1345 08/06/19 1400 08/06/19 1415 08/06/19 1430  BP: 115/74 113/75 124/77 111/69  Pulse: 67 68 66 66  Resp: 11 11 16 14   Temp:      TempSrc:      SpO2: 97% 98% 97% 97%    Intake/Output Summary (Last 24 hours) at 08/06/2019 1511 Last data filed at 08/06/2019 1446 Gross per 24 hour  Intake --  Output 700 ml  Net -700 ml   Last 3 Weights 06/27/2019 06/12/2019 06/11/2019  Weight (lbs) 383 lb 9.6 oz 374 lb 3.2 oz 373 lb 3.8 oz  Weight (kg) 174 kg 169.736 kg 169.3 kg     There is no height or weight on file to calculate BMI.  General:  Well nourished, well developed, in no acute distress. Very tall and heavily built, but also morbidly obese HEENT: normal Lymph: no adenopathy Neck: Unable to evaluate for JVD Endocrine:  No thryomegaly Vascular: No carotid bruits; FA pulses 2+ bilaterally without bruits  Cardiac:  normal S1, S2; RRR; no murmur  Lungs:  clear to auscultation bilaterally, no wheezing, rhonchi or rales  Abd: soft, nontender, no hepatomegaly  Ext: 3+ pitting edema two thirds of the way up the calves to the knees bilaterally in a symmetrical pattern, chronic trophic skin changes suggesting chronic brawny edema Musculoskeletal:  No deformities, BUE and BLE strength normal and equal Skin: warm and dry  Neuro:  CNs 2-12 intact, no focal abnormalities noted Psych:  Normal affect    EKG:  The ECG that was done was personally reviewed and demonstrates sinus rhythm, left axis deviation/left anterior fascicular block/pulmonary disease pattern, no acute repolarization  abnormalities  Relevant CV Studies: Echocardiogram 03/05/2019  1. Technically difficult study in all windows. Definity contrast shows  hyperdynamic EF, no gross focal wall motion abnormalities but limited  sensitivity for detection of minor abnormalities.  2. Left ventricular ejection fraction, by visual estimation, is 65 to  70%. The left ventricle has hyperdynamic function. There is borderline  left ventricular hypertrophy.  3. Definity contrast agent was given IV to delineate the left ventricular  endocardial borders.  4. The left ventricle has no regional wall motion abnormalities.  5. Global right ventricle has normal systolic function.The right  ventricular size is normal. No increase in right ventricular wall  thickness.  6. Left atrial size was normal.  7. Right atrial size was normal.  8. Presence of pericardial fat pad.  9. Small pericardial effusion.  10. The mitral valve is normal in structure. No evidence of mitral valve  regurgitation.  11. The tricuspid valve is normal in structure. Tricuspid valve  regurgitation is not demonstrated.   Laboratory Data:  High Sensitivity Troponin:  No results for input(s): TROPONINIHS in the last 720 hours.    Chemistry Recent Labs  Lab 08/06/19 0554  NA 135  K 2.9*  CL 89*  CO2 30  GLUCOSE 119*  BUN 17  CREATININE 1.11  CALCIUM 9.8  GFRNONAA >60  GFRAA >60  ANIONGAP 16*    No results for input(s): PROT, ALBUMIN, AST, ALT, ALKPHOS, BILITOT in the last 168 hours. Hematology Recent Labs  Lab 08/06/19 0554  WBC 9.3  RBC 5.02  HGB 12.1*  HCT 39.6  MCV 78.9*  MCH 24.1*  MCHC 30.6  RDW 15.9*  PLT 166   BNP Recent Labs  Lab 08/06/19 0554  BNP 13.7    DDimer No results for input(s): DDIMER in the last 168 hours.   Radiology/Studies:  DG Chest 2  View  Result Date: 08/06/2019 CLINICAL DATA:  64 year old male with history of shortness of breath. EXAM: CHEST - 2 VIEW COMPARISON:  Chest x-ray  06/26/2019. FINDINGS: Lung volumes are normal. No consolidative airspace disease. No pleural effusions. No pneumothorax. No pulmonary nodule or mass noted. Pulmonary vasculature and the cardiomediastinal silhouette are within normal limits. Multiple old healed right-sided rib fractures are incidentally noted. IMPRESSION: 1.  No radiographic evidence of acute cardiopulmonary disease. Electronically Signed   By: Vinnie Langton M.D.   On: 08/06/2019 07:34    Assessment and Plan:   1. CHF: He has clear evidence of volume overload, in particular severe lower extremity edema, but I'm not confident that the diagnosis of left heart failure is accurate. Although he has shortness of breath, on physical exam there are exclusively signs of right heart failure. On my review of the echocardiogram from November 2020, left ventricular diastolic function parameters are normal. His BNP is consistently low (although this can be seen in severely obese patients with heart failure). His chest x-ray is remarkably clear. Note that his previous CT shows no evidence of atherosclerosis involving the thoracic aorta and its branches or the coronary arteries.  I wonder whether his shortness of breath may be caused by alternate etiology such as pulmonary hypertension related to chronic thromboembolic disease or untreated obstructive sleep apnea. Ultimately, once we complete diuresis and think he is hypervolemic, he would benefit from right heart catheterization.  2. Hx of PE: He reports compliance with Eliquis 5 mg twice daily. It is hard to know whether this is an effective dose in a 6 foot 10 inches, 400 pound gentleman. I think we need to be wary of missing the diagnosis of recurrent pulmonary embolism. For what it is worth, he does not have the painful symptoms that he had with pulmonary embolism in February. Will check D-dimer. As mentioned he would benefit from right and left heart catheterization to make sure he does not have  chronic thromboembolic pulmonary hypertension. 3. OSA: He has a typical phenotype in multiple symptoms suggestive of this disorder. Another effort at making a precise diagnosis and treatment with CPAP should be undertaken. 4. Fe def anemia: Mild and improving despite continued anticoagulation. I don't think there is evidence to support ongoing bleeding. He should continue iron supplements. Note that his previous thrombocytopenia has normalized. 5. Hypokalemia: Due to the combination of loop diuretic and thiazide diuretic. Receiving replacement. In the long run he may benefit from adding a potassium sparing diuretic, especially if he has liver disease. 6. Cirrhosis/[name]: The diagnosis is based primarily on imaging studies, although his endoscopy did suggest some very mild portal gastropathy. No varices were seen. He did have thrombocytopenia but this has resolved. Normal liver function tests including normal bilirubin, prothrombin time and albumin. 7. Atrial tachycardia: True arrhythmia with abrupt interruption, less likely atrial flutter (suspect ectopic atrial tachycardia).  Reevaluate after correction of electrolytes.  May decide to treat with a centrally acting channel blocker rather than a beta-blocker due to reactive airway disease.  Severity of Illness: The appropriate patient status for this patient is OBSERVATION. Observation status is judged to be reasonable and necessary in order to provide the required intensity of service to ensure the patient's safety. The patient's presenting symptoms, physical exam findings, and initial radiographic and laboratory data in the context of their medical condition is felt to place them at decreased risk for further clinical deterioration. Furthermore, it is anticipated that the patient will be medically stable  for discharge from the hospital within 2 midnights of admission. The following factors support the patient status of observation.   " The patient's  presenting symptoms include Severe dyspnea and edema. " The physical exam findings include Severe edema and tachycardia " The initial radiographic and laboratory data are Severe hypokalemia, paroxysmal atrial tachycardia on telemetry     For questions or updates, please contact [facility] Please consult [email or web address] for contact info under        Signed, Sanda Klein, MD  08/06/2019 3:11 PM

## 2019-08-06 NOTE — ED Triage Notes (Signed)
Pt transported from home by EMS with c/o shob x 3 days. Pt reports currently being tx for DVT and PE. C/o neck pain

## 2019-08-06 NOTE — ED Provider Notes (Signed)
[facility] EMERGENCY DEPARTMENT Provider Note   CSN: [id number] Arrival date & time: 08/06/19  0540     History Chief Complaint  Patient presents with  . Shortness of Breath    Charles Gray is a 64 y.o. male.  64yo M w/ PMH including HFpEF, DVT/PE on Eliquis, morbid obesity, HTN, hLD who p/w SOB. He reports 3 days of SOB. He feels like he can only catch half a breath. He has had episodes like this before. SOB worse w/ exertion and w/ laying flat in bed. He hasn't slept well lately, usually has to sleep propped up. He reports LE edema that is intermittent, was worse last night. He noted 9lb weight gain a few days ago. This does not feel like his PE, denies any chest pain. He reports nausea, no vomiting. He is compliant with meds. In the waiting room he got hot and clammy but no CP.   Of note, patient called cardiologist 2 days ago and reported these symptoms, was instructed to go to ED for evaluation.   The history is provided by the patient.  Shortness of Breath      Past Medical History:  Diagnosis Date  . Acute respiratory failure with hypoxia ([place])   . Anemia of chronic disease   . Benign essential HTN   . Chronic heart failure with preserved ejection fraction ([place]) 05/30/2019  . Chronic pain syndrome   . Cirrhosis of liver without ascites ([place])   . Debility 03/13/2019  . Drug induced constipation   . Dyspnea   . Fatty liver disease, nonalcoholic   . GIB (gastrointestinal bleeding) 03/04/2019  . Hyperlipidemia 01/24/2019  . Hypertension 01/24/2019  . Hypoalbuminemia due to protein-calorie malnutrition ([place])   . Hypokalemia   . Hyponatremia   . Hypotension due to drugs   . Insomnia 01/24/2019  . Lumbar disc herniation 01/24/2019  . Morbid obesity with BMI of 40.0-44.9, adult ([place]) 05/30/2019  . Normocytic anemia 06/08/2019  . Peripheral edema   . Pulmonary emboli ([place]) 06/08/2019  . Pulmonary embolism ([place]) 06/08/2019  . Shock ([place])   .  Thrombocytopenia [facility])     Patient Active Problem List   Diagnosis Date Noted  . Pulmonary embolism ([place]) 06/08/2019  . Normocytic anemia 06/08/2019  . Pulmonary emboli ([place]) 06/08/2019  . Chronic heart failure with preserved ejection fraction ([place]) 05/30/2019  . Morbid obesity with BMI of 40.0-44.9, adult ([place]) 05/30/2019  . Hyponatremia   . Benign essential HTN   . Drug induced constipation   . Peripheral edema   . Hypotension due to drugs   . Hypoalbuminemia due to protein-calorie malnutrition ([place])   . Hypokalemia   . Thrombocytopenia ([place])   . Anemia of chronic disease   . Cirrhosis of liver without ascites ([place])   . Chronic pain syndrome   . Debility 03/13/2019  . Acute respiratory failure with hypoxia ([place])   . Shock ([place])   . Dyspnea   . GIB (gastrointestinal bleeding) 03/04/2019  . Hyperlipidemia 01/24/2019  . Hypertension 01/24/2019  . Insomnia 01/24/2019    Past Surgical History:  Procedure Laterality Date  . ESOPHAGOGASTRODUODENOSCOPY (EGD) WITH PROPOFOL N/A 03/05/2019   Procedure: ESOPHAGOGASTRODUODENOSCOPY (EGD) WITH PROPOFOL;  Surgeon: Doran Stabler, MD;  Location: [facility];  Service: Gastroenterology;  Laterality: N/A;  Large Blood Clot Removed  . ESOPHAGOGASTRODUODENOSCOPY (EGD) WITH PROPOFOL N/A 03/06/2019   Procedure: ESOPHAGOGASTRODUODENOSCOPY (EGD) WITH PROPOFOL;  Surgeon: Thornton Park, MD;  Location: [facility];  Service: Gastroenterology;  Laterality: N/A;  . HEMORROIDECTOMY         Family History  Problem Relation Age of Onset  . Hyperlipidemia Mother   . Hypertension Mother   . Stroke Mother   . CAD Maternal Grandmother   . CAD Maternal Grandfather     Social History   Tobacco Use  . Smoking status: Never Smoker  . Smokeless tobacco: Never Used  Substance Use Topics  . Alcohol use: Never  . Drug use: Not Currently    Types: Marijuana    Comment: Smoked for 30 years    Home Medications Prior to Admission  medications   Medication Sig Start Date End Date Taking? Authorizing Provider  acetaminophen (TYLENOL) 325 MG tablet Take 2 tablets (650 mg total) by mouth every 4 (four) hours as needed for fever or mild pain. 03/23/19   Angiulli, Lavon Paganini, PA-C  apixaban (ELIQUIS) 5 MG TABS tablet Take 2 tablets (44m) twice daily for 7 days, then 1 tablet (514m twice daily 06/12/19   VaGeradine GirtDO  Buprenorphine HCl-Naloxone HCl 8-2 MG FILM Place 1 tablet under the tongue in the morning and at bedtime.     [provider]  diphenhydrAMINE (BENADRYL) 25 MG tablet Take 25 mg by mouth every 6 (six) hours as needed for itching or allergies.    [provider]  furosemide (LASIX) 40 MG tablet Take 1 tablet (40 mg total) by mouth at bedtime. 06/12/19   VaGeradine GirtDO  furosemide (LASIX) 80 MG tablet Take 1 tablet (80 mg total) by mouth daily. 06/12/19   VaGeradine GirtDO  gabapentin (NEURONTIN) 800 MG tablet Take 1 tablet (800 mg total) by mouth 3 (three) times daily. 03/23/19   Angiulli, DaLavon PaganiniPA-C  lisinopril (ZESTRIL) 5 MG tablet Take 3 tablets (15 mg total) by mouth daily. 03/23/19   Angiulli, DaLavon PaganiniPA-C  metolazone (ZAROXOLYN) 5 MG tablet Take 1 tablet (5 mg total) by mouth every other day. 06/13/19   VaGeradine GirtDO  pantoprazole (PROTONIX) 40 MG tablet Take 40 mg by mouth 2 (two) times daily.    [provider]  polyethylene glycol (MIRALAX / GLYCOLAX) 17 g packet Take 17 g by mouth daily. 03/23/19   Angiulli, DaLavon PaganiniPA-C  potassium chloride SA (KLOR-CON) 20 MEQ tablet Take 20 mEq by mouth 2 (two) times daily.    [provider]  pravastatin (PRAVACHOL) 20 MG tablet Take 1 tablet (20 mg total) by mouth daily. 03/23/19   Angiulli, DaLavon PaganiniPA-C  VENTOLIN HFA 108 (90 Base) MCG/ACT inhaler Inhale 1 puff into the lungs every 4 (four) hours as needed for shortness of breath. 03/23/19   Angiulli, DaLavon PaganiniPA-C  zolpidem (AMBIEN) 10 MG tablet Take 10 mg by mouth  at bedtime as needed for sleep.  05/19/19   [provider]    Allergies    Penicillins  Review of Systems   Review of Systems  Respiratory: Positive for shortness of breath.    All other systems reviewed and are negative except that which was mentioned in HPI  Physical Exam Updated Vital Signs BP 125/71   Pulse 72   Temp 98.1 F (36.7 C) (Oral)   Resp 14   SpO2 94%   Physical Exam Vitals and nursing note reviewed.  Constitutional:      General: He is not in acute distress.    Appearance: He is well-developed. He is obese.     Comments: uncomfortable  HENT:     Head: Normocephalic and atraumatic.  Eyes:     Conjunctiva/sclera: Conjunctivae normal.  Cardiovascular:     Rate and Rhythm: Regular rhythm. Tachycardia present.     Heart sounds: Normal heart sounds. No murmur.  Pulmonary:     Effort: Pulmonary effort is normal.     Comments: Diminished in b/l bases, exam limited by body habitus Abdominal:     General: Bowel sounds are normal. There is no distension.     Palpations: Abdomen is soft.     Tenderness: There is no abdominal tenderness.  Musculoskeletal:     Cervical back: Neck supple.     Right lower leg: Edema present.     Left lower leg: Edema present.     Comments: 3+ pitting edema BLE  Skin:    General: Skin is warm and dry.  Neurological:     Mental Status: He is alert and oriented to person, place, and time.     Comments: Fluent speech  Psychiatric:        Judgment: Judgment normal.     ED Results / Procedures / Treatments   Labs (all labs ordered are listed, but only abnormal results are displayed) Labs Reviewed  BASIC METABOLIC PANEL - Abnormal; Notable for the following components:      Result Value   Potassium 2.9 (*)    Chloride 89 (*)    Glucose, Bld 119 (*)    Anion gap 16 (*)    All other components within normal limits  CBC - Abnormal; Notable for the following components:   Hemoglobin 12.1 (*)    MCV 78.9 (*)    MCH  24.1 (*)    RDW 15.9 (*)    All other components within normal limits  RESPIRATORY PANEL BY RT PCR (FLU A&B, COVID)  BRAIN NATRIURETIC PEPTIDE  TROPONIN I (HIGH SENSITIVITY)    EKG None  Radiology DG Chest 2 View  Result Date: 08/06/2019 CLINICAL DATA:  64 year old male with history of shortness of breath. EXAM: CHEST - 2 VIEW COMPARISON:  Chest x-ray 06/26/2019. FINDINGS: Lung volumes are normal. No consolidative airspace disease. No pleural effusions. No pneumothorax. No pulmonary nodule or mass noted. Pulmonary vasculature and the cardiomediastinal silhouette are within normal limits. Multiple old healed right-sided rib fractures are incidentally noted. IMPRESSION: 1.  No radiographic evidence of acute cardiopulmonary disease. Electronically Signed   By: Vinnie Langton M.D.   On: 08/06/2019 07:34    Procedures Procedures (including critical care time)  Medications Ordered in ED Medications  sodium chloride flush (NS) 0.9 % injection 3 mL (3 mLs Intravenous Not Given 08/06/19 1106)  apixaban (ELIQUIS) tablet 5 mg (has no administration in time range)  potassium chloride SA (KLOR-CON) CR tablet 40 mEq (has no administration in time range)  furosemide (LASIX) injection 80 mg (has no administration in time range)  potassium chloride 10 mEq in 100 mL IVPB (has no administration in time range)    ED Course  I have reviewed the triage vital signs and the nursing notes.  Pertinent labs & imaging results that were available during my care of the patient were reviewed by me and considered in my medical decision making (see chart for details).    MDM Rules/Calculators/A&P                      Pt w/ normal VS at triage. When I saw him, he was walking back from bathroom and was severely winded  w/ HR 145 on the monitor, sinus tach. He eventually got back to normal HR at rest. He appears to be volume overloaded based on exam and hx. He is compliant w/ eliquis, making PE less likely. Labs  show K 2.9, Cl 89, Cr 1.11, AG 16, BG 119, Hgb 12.1. CXR without obvious volume overload, no PTX or infiltrate. I have ordered potassium repletion and IV lasix. I discussed w/ cardiology, Dr. Lovena Le. He will see pt. Pt will be admitted for further w/u and treatment.   Final Clinical Impression(s) / ED Diagnoses Final diagnoses:  None    Rx / DC Orders ED Discharge Orders    None       L[place]a Mittelman, Wenda Overland, MD 08/06/19 1419

## 2019-08-06 NOTE — ED Notes (Signed)
Pr provider: pt able to take personal medicine, 90m saboxone.

## 2019-08-07 ENCOUNTER — Other Ambulatory Visit: Payer: Self-pay

## 2019-08-07 ENCOUNTER — Inpatient Hospital Stay (HOSPITAL_COMMUNITY): Payer: Medicare Other

## 2019-08-07 DIAGNOSIS — N179 Acute kidney failure, unspecified: Secondary | ICD-10-CM

## 2019-08-07 DIAGNOSIS — G4733 Obstructive sleep apnea (adult) (pediatric): Secondary | ICD-10-CM

## 2019-08-07 DIAGNOSIS — I272 Pulmonary hypertension, unspecified: Secondary | ICD-10-CM

## 2019-08-07 DIAGNOSIS — K7581 Nonalcoholic steatohepatitis (NASH): Secondary | ICD-10-CM

## 2019-08-07 DIAGNOSIS — I471 Supraventricular tachycardia: Secondary | ICD-10-CM

## 2019-08-07 LAB — BASIC METABOLIC PANEL
Anion gap: 13 (ref 5–15)
BUN: 23 mg/dL (ref 8–23)
CO2: 32 mmol/L (ref 22–32)
Calcium: 9.5 mg/dL (ref 8.9–10.3)
Chloride: 91 mmol/L — ABNORMAL LOW (ref 98–111)
Creatinine, Ser: 1.49 mg/dL — ABNORMAL HIGH (ref 0.61–1.24)
GFR calc Af Amer: 57 mL/min — ABNORMAL LOW (ref >60)
GFR calc non Af Amer: 49 mL/min — ABNORMAL LOW (ref >60)
Glucose, Bld: 123 mg/dL — ABNORMAL HIGH (ref 70–99)
Potassium: 3.2 mmol/L — ABNORMAL LOW (ref 3.5–5.1)
Sodium: 136 mmol/L (ref 135–145)

## 2019-08-07 LAB — ECHOCARDIOGRAM COMPLETE
Height: 82 in
Weight: 6000 oz

## 2019-08-07 MED ORDER — PERFLUTREN LIPID MICROSPHERE
1.0000 mL | INTRAVENOUS | Status: AC | PRN
  Administered 2019-08-07: 13:00:00 2 mL via INTRAVENOUS
  Filled 2019-08-07: qty 10

## 2019-08-07 MED ORDER — GABAPENTIN 400 MG PO CAPS
400.0000 mg | ORAL_CAPSULE | Freq: Three times a day (TID) | ORAL | Status: DC
  Administered 2019-08-07 – 2019-08-08 (×2): 400 mg via ORAL
  Filled 2019-08-07 (×2): qty 1

## 2019-08-07 MED ORDER — ALBUTEROL SULFATE (2.5 MG/3ML) 0.083% IN NEBU: 2.5000 mg | INHALATION_SOLUTION | RESPIRATORY_TRACT | Status: DC | PRN

## 2019-08-07 MED ORDER — FUROSEMIDE 10 MG/ML IJ SOLN
80.0000 mg | Freq: Every day | INTRAMUSCULAR | Status: DC
  Administered 2019-08-08 – 2019-08-10 (×3): 80 mg via INTRAVENOUS
  Filled 2019-08-07 (×4): qty 8

## 2019-08-07 NOTE — ED Notes (Signed)
Attempted report 

## 2019-08-07 NOTE — Progress Notes (Signed)
  Echocardiogram 2D Echocardiogram with defintiy has been performed.  Charles Gray M 08/07/2019, 12:48 PM

## 2019-08-07 NOTE — ED Notes (Signed)
Pt sitting on side of bed when taken breakfast tray. Pt given tray and he is eating now.

## 2019-08-07 NOTE — ED Notes (Signed)
Breakfast ordered 

## 2019-08-07 NOTE — ED Notes (Signed)
Confirmed with Brittany-Sec that lunches are ordered.

## 2019-08-07 NOTE — Plan of Care (Signed)
  Problem: Education: Goal: Knowledge of General Education information will improve Description: Including pain rating scale, medication(s)/side effects and non-pharmacologic comfort measures 08/07/2019 2256 by Nelia Shi, RN Outcome: Progressing 08/07/2019 2256 by Nelia Shi, RN Outcome: Progressing

## 2019-08-07 NOTE — Progress Notes (Signed)
Progress Note  Patient Name: Charles Gray Date of Encounter: 08/07/2019  Primary Cardiologist: Berniece Salines, DO   Subjective   Diuresis and intake have not been recorded, but he reports "3-1/2 drugs" of urine output. K improved, still low at 3.2. Creatinine up sharply. D-dimer negative.  Inpatient Medications    Scheduled Meds: . apixaban  5 mg Oral BID  . buprenorphine-naloxone  1 tablet Sublingual BID  . furosemide  80 mg Intravenous BID  . gabapentin  800 mg Oral TID  . lisinopril  15 mg Oral Daily  . pantoprazole  40 mg Oral BID  . polyethylene glycol  17 g Oral Daily  . potassium chloride  40 mEq Oral BID  . pravastatin  20 mg Oral Daily  . sodium chloride flush  3 mL Intravenous Once   Continuous Infusions:  PRN Meds: acetaminophen, albuterol, nitroGLYCERIN, ondansetron (ZOFRAN) IV, zolpidem   Vital Signs    Vitals:   08/07/19 0202 08/07/19 0301 08/07/19 0400 08/07/19 0500  BP:  (!) 92/59 (!) 111/55 (!) 122/101  Pulse: (!) 114 74 62 82  Resp: 12 14 16 15   Temp:      TempSrc:      SpO2: 96% 94% 91% (!) 88%    Intake/Output Summary (Last 24 hours) at 08/07/2019 0800 Last data filed at 08/06/2019 1446 Gross per 24 hour  Intake --  Output 700 ml  Net -700 ml   Last 3 Weights 06/27/2019 06/12/2019 06/11/2019  Weight (lbs) 383 lb 9.6 oz 374 lb 3.2 oz 373 lb 3.8 oz  Weight (kg) 174 kg 169.736 kg 169.3 kg      Telemetry    Normal sinus rhythm, no new episodes of atrial tachycardia- Personally Reviewed  ECG    No new tracing- Personally Reviewed  Physical Exam  Obese GEN: No acute distress.   Neck:  Unable to evaluate JVD Cardiac: RRR, no murmurs, rubs, or gallops.  Respiratory: Clear to auscultation bilaterally. GI: Soft, nontender, non-distended  MS:  2-3+ dependent edema, roughly halfway up calves, symmetrical; No deformity. Neuro:  Nonfocal  Psych: Normal affect   Labs    High Sensitivity Troponin:   Recent Labs  Lab 08/06/19 1728   TROPONINIHS 5      Chemistry Recent Labs  Lab 08/06/19 0554 08/07/19 0409  NA 135 136  K 2.9* 3.2*  CL 89* 91*  CO2 30 32  GLUCOSE 119* 123*  BUN 17 23  CREATININE 1.11 1.49*  CALCIUM 9.8 9.5  GFRNONAA >60 49*  GFRAA >60 57*  ANIONGAP 16* 13     Hematology Recent Labs  Lab 08/06/19 0554  WBC 9.3  RBC 5.02  HGB 12.1*  HCT 39.6  MCV 78.9*  MCH 24.1*  MCHC 30.6  RDW 15.9*  PLT 166    BNP Recent Labs  Lab 08/06/19 0554  BNP 13.7     DDimer  Recent Labs  Lab 08/06/19 1728  DDIMER <0.27     Radiology    DG Chest 2 View  Result Date: 08/06/2019 CLINICAL DATA:  64 year old male with history of shortness of breath. EXAM: CHEST - 2 VIEW COMPARISON:  Chest x-ray 06/26/2019. FINDINGS: Lung volumes are normal. No consolidative airspace disease. No pleural effusions. No pneumothorax. No pulmonary nodule or mass noted. Pulmonary vasculature and the cardiomediastinal silhouette are within normal limits. Multiple old healed right-sided rib fractures are incidentally noted. IMPRESSION: 1.  No radiographic evidence of acute cardiopulmonary disease. Electronically Signed   By: Vinnie Langton  M.D.   On: 08/06/2019 07:34    Cardiac Studies    Echocardiogram 03/05/2019  1. Technically difficult study in all windows. Definity contrast shows  hyperdynamic EF, no gross focal wall motion abnormalities but limited  sensitivity for detection of minor abnormalities.  2. Left ventricular ejection fraction, by visual estimation, is 65 to  70%. The left ventricle has hyperdynamic function. There is borderline  left ventricular hypertrophy.  3. Definity contrast agent was given IV to delineate the left ventricular  endocardial borders.  4. The left ventricle has no regional wall motion abnormalities.  5. Global right ventricle has normal systolic function.The right  ventricular size is normal. No increase in right ventricular wall  thickness.  6. Left atrial size  was normal.  7. Right atrial size was normal.  8. Presence of pericardial fat pad.  9. Small pericardial effusion.  10. The mitral valve is normal in structure. No evidence of mitral valve  regurgitation.  11. The tricuspid valve is normal in structure. Tricuspid valve  regurgitation is not demonstrated.   Patient Profile     64 y.o. male with a history of chronic diastolic heart failure, obstructive sleep apnea, suspected cirrhosis of the liver, recent acute pulmonary embolism (February 2021), history of severe GI bleeding requiring transfusion (November 2020), hypertension, hyperlipidemia, morbid obesity, chronic pain due to lumbar spine spondyloarthrosis/herniated disks, chronic opiate use presenting to the emergency room due to edema and dyspnea refractory to home oral diuretics  Assessment & Plan    1. CHF:  Still has substantial edema.  He reports roughly 9 pound weight gain in 2 days prior to admission, if his personal report of extreme amount of drugs included urine output is accurate, it is possible he may have lost 5 or 6 pounds since admission.  On exam has only had signs of right heart failure and his chest x-ray and BNP do not support a diagnosis of left heart failure.  On my review of the echocardiogram from November 2020, left ventricular diastolic function parameters are normal.  I think he would benefit from right heart catheterization to clarify the mechanism of his dyspnea 2. Hx of PE:  D-dimer negative on this admission.  He reports compliance with Eliquis 5 mg twice daily. It is hard to know whether this is an effective dose in a 6 foot 10 inches, 400 pound gentleman.  I wonder whether warfarin would not be a safer alternative. 3. OSA: He has a typical phenotype and he has multiple symptoms suggestive of this disorder. Another effort at making a precise diagnosis and treatment with CPAP should be undertaken, after hospital discharge. 4. Fe def anemia:  Anemia is improving  and is now very mild.  Still has microcytosis.  Note that his previous thrombocytopenia has normalized. 5. Hypokalemia:  Improving, still requires more supplements.  Will slow down the diuretics. 6. AKI: Due to aggressive diuresis.  We will switch to furosemide 20 once daily. 7. Cirrhosis/[name]: The diagnosis is based primarily on imaging studies, although his endoscopy did suggest some very mild portal gastropathy. No varices were seen. He did have thrombocytopenia but this has resolved. Normal liver function tests including normal bilirubin, prothrombin time and albumin. 8. Atrial tachycardia:  Recorded on telemetry yesterday, none since then.  He reports occasional palpitations that he feels as a throbbing sensation in his chest and throat.  He has reactive airway disease and if we decide to treat these, calcium channel blockers may be more appropriate than beta-blockers.Marland Kitchen  For questions or updates, please contact [place] Please consult [email or web address] for contact info under        Signed, Sanda Klein, MD  08/07/2019, 8:00 AM

## 2019-08-07 NOTE — ED Notes (Signed)
100% of breakfast tray eaten.

## 2019-08-07 NOTE — Plan of Care (Signed)
  Problem: Education: Goal: Knowledge of General Education information will improve Description Including pain rating scale, medication(s)/side effects and non-pharmacologic comfort measures Outcome: Progressing   

## 2019-08-08 ENCOUNTER — Encounter (HOSPITAL_COMMUNITY): Payer: Self-pay | Admitting: Cardiovascular Disease

## 2019-08-08 DIAGNOSIS — N179 Acute kidney failure, unspecified: Secondary | ICD-10-CM

## 2019-08-08 LAB — BASIC METABOLIC PANEL
Anion gap: 12 (ref 5–15)
BUN: 29 mg/dL — ABNORMAL HIGH (ref 8–23)
CO2: 30 mmol/L (ref 22–32)
Calcium: 9.5 mg/dL (ref 8.9–10.3)
Chloride: 92 mmol/L — ABNORMAL LOW (ref 98–111)
Creatinine, Ser: 1.55 mg/dL — ABNORMAL HIGH (ref 0.61–1.24)
GFR calc Af Amer: 54 mL/min — ABNORMAL LOW (ref >60)
GFR calc non Af Amer: 47 mL/min — ABNORMAL LOW (ref >60)
Glucose, Bld: 93 mg/dL (ref 70–99)
Potassium: 3.8 mmol/L (ref 3.5–5.1)
Sodium: 134 mmol/L — ABNORMAL LOW (ref 135–145)

## 2019-08-08 MED ORDER — SODIUM CHLORIDE 0.9 % IV SOLN: 250.0000 mL | INTRAVENOUS | Status: DC | PRN

## 2019-08-08 MED ORDER — SODIUM CHLORIDE 0.9% FLUSH: 3.0000 mL | INTRAVENOUS | Status: DC | PRN

## 2019-08-08 MED ORDER — SODIUM CHLORIDE 0.9 % IV SOLN: INTRAVENOUS | Status: DC

## 2019-08-08 MED ORDER — GABAPENTIN 400 MG PO CAPS
800.0000 mg | ORAL_CAPSULE | Freq: Three times a day (TID) | ORAL | Status: DC
  Administered 2019-08-08 – 2019-08-12 (×12): 800 mg via ORAL
  Filled 2019-08-08 (×12): qty 2

## 2019-08-08 MED ORDER — SODIUM CHLORIDE 0.9% FLUSH
3.0000 mL | Freq: Two times a day (BID) | INTRAVENOUS | Status: DC
  Administered 2019-08-08: 3 mL via INTRAVENOUS

## 2019-08-08 NOTE — Progress Notes (Addendum)
Progress Note  Patient Name: Charles Gray Date of Encounter: 08/08/2019  Primary Cardiologist: Berniece Salines, DO   Subjective   C/o neck pain, acute on chronic Concerned about shaking in both hands, spilled coffee the other day. This has been coming on for a couple of years. Breathing not back to baseline, still gets SOB when he moves around.  Doesn't think he put out much fluid overnight  Inpatient Medications    Scheduled Meds: . apixaban  5 mg Oral BID  . buprenorphine-naloxone  1 tablet Sublingual BID  . furosemide  80 mg Intravenous Daily  . gabapentin  400 mg Oral TID  . lisinopril  15 mg Oral Daily  . pantoprazole  40 mg Oral BID  . polyethylene glycol  17 g Oral Daily  . potassium chloride  40 mEq Oral BID  . pravastatin  20 mg Oral Daily  . sodium chloride flush  3 mL Intravenous Once   Continuous Infusions:  PRN Meds: acetaminophen, albuterol, nitroGLYCERIN, ondansetron (ZOFRAN) IV, zolpidem   Vital Signs    Vitals:   08/07/19 2000 08/07/19 2359 08/08/19 0336 08/08/19 0759  BP: (!) 115/54 119/78 108/68 (!) 88/53  Pulse: 74 84 74   Resp: 18 18 20 17   Temp: 98.5 F (36.9 C) 98.2 F (36.8 C) 98.2 F (36.8 C) 97.7 F (36.5 C)  TempSrc: Oral Oral Oral Oral  SpO2: 99% 99% 99%   Weight:   (!) 170.4 kg   Height:        Intake/Output Summary (Last 24 hours) at 08/08/2019 0825 Last data filed at 08/08/2019 0600 Gross per 24 hour  Intake 240 ml  Output 600 ml  Net -360 ml   Last 3 Weights 08/08/2019 08/07/2019 06/27/2019  Weight (lbs) 375 lb 11.2 oz 375 lb 383 lb 9.6 oz  Weight (kg) 170.416 kg 170.099 kg 174 kg      Telemetry    SR, PVCs, ?bigem, +Artifact Personally Reviewed  ECG    No new tracing- Personally Reviewed  Physical Exam   GEN: No acute distress.   Neck: No JVD seen, difficult to assess 2nd body habitus Cardiac: RRR, no murmurs, rubs, or gallops.  Respiratory: diminished to auscultation bilaterally GI: Soft, nontender,  non-distended  MS: 1+ LE edema; No deformity. Neuro:  Nonfocal  Psych: Normal affect   Labs    High Sensitivity Troponin:   Recent Labs  Lab 08/06/19 1728  TROPONINIHS 5      Chemistry Recent Labs  Lab 08/06/19 0554 08/07/19 0409  NA 135 136  K 2.9* 3.2*  CL 89* 91*  CO2 30 32  GLUCOSE 119* 123*  BUN 17 23  CREATININE 1.11 1.49*  CALCIUM 9.8 9.5  GFRNONAA >60 49*  GFRAA >60 57*  ANIONGAP 16* 13     Hematology Recent Labs  Lab 08/06/19 0554  WBC 9.3  RBC 5.02  HGB 12.1*  HCT 39.6  MCV 78.9*  MCH 24.1*  MCHC 30.6  RDW 15.9*  PLT 166    BNP Recent Labs  Lab 08/06/19 0554  BNP 13.7     DDimer  Recent Labs  Lab 08/06/19 1728  DDIMER <0.27    Lab Results  Component Value Date   IRON 37 (L) 06/12/2019   TIBC 501 (H) 06/12/2019   FERRITIN 301 03/04/2019     Radiology    ECHOCARDIOGRAM COMPLETE  Result Date: 08/07/2019    ECHOCARDIOGRAM REPORT   Patient Name:   Charles Gray Date of Exam: 08/07/2019  Medical Rec #:  [id number]       Height:       82.0 in Accession #:    [id number]      Weight:       383.6 lb Date of Birth:  [birth year]       BSA:          3.088 m Patient Age:    64 years        BP:           133/83 mmHg Patient Gender: M               HR:           70 bpm. Exam Location:  Inpatient Procedure: 2D Echo and Intracardiac Opacification Agent Indications:    Pulmonary hypertension 416.8 / I27.2  History:        Patient has no prior history of Echocardiogram examinations,                 most recent 06/08/2019. CHF; Risk Factors:Sleep Apnea,                 Hypertension and Dyslipidemia. Cirrhosis of the liver, Acute                 pulmonary embolism 2/21, Thrombocytopenia, Edema.  Sonographer:    Darlina Sicilian RDCS Referring Phys: [id number] Darreld Mclean  Sonographer Comments: Technically difficult study due to poor echo windows and suboptimal apical window. Echo performed with the patient upright for comfort. IMPRESSIONS  1. Suboptimal image  quality for diagnostic purposes.  2. Left ventricular ejection fraction, by estimation, is 55 to 60%. The left ventricle has normal function. Left ventricular endocardial border not optimally defined to evaluate regional wall motion. Left ventricular diastolic parameters are consistent with Grade I diastolic dysfunction (impaired relaxation).  3. Right ventricular systolic function was not well visualized. The right ventricular size is not well visualized.  4. The mitral valve is grossly normal. No evidence of mitral valve regurgitation. No evidence of mitral stenosis.  5. The aortic valve was not well visualized. Aortic valve regurgitation is not visualized. No aortic stenosis is present. FINDINGS  Left Ventricle: Left ventricular ejection fraction, by estimation, is 55 to 60%. The left ventricle has normal function. Left ventricular endocardial border not optimally defined to evaluate regional wall motion. Definity contrast agent was given IV to delineate the left ventricular endocardial borders. The left ventricular internal cavity size was normal in size. Left ventricular diastolic parameters are consistent with Grade I diastolic dysfunction (impaired relaxation). Right Ventricle: The right ventricular size is not well visualized. Right vetricular wall thickness was not assessed. Right ventricular systolic function was not well visualized. Left Atrium: Left atrial size was not well visualized. Right Atrium: Right atrial size was not well visualized. Pericardium: The pericardium was not well visualized. Presence of pericardial fat pad. Mitral Valve: The mitral valve is grossly normal. No evidence of mitral valve regurgitation. No evidence of mitral valve stenosis. Tricuspid Valve: The tricuspid valve is not well visualized. Tricuspid valve regurgitation is not demonstrated. Aortic Valve: The aortic valve was not well visualized. Aortic valve regurgitation is not visualized. No aortic stenosis is present. Pulmonic  Valve: The pulmonic valve was not well visualized. Pulmonic valve regurgitation is trivial. Aorta: Aortic root measures 37 mm which may be within normal limits for age when indexed to BSA. The aortic root was not well visualized. Venous: The inferior vena cava was not well visualized. IAS/Shunts:  The interatrial septum was not well visualized.  LEFT VENTRICLE PLAX 2D                 Diastology LVOT diam:     2.30 cm  LV e' lateral:   7.94 cm/s LVOT Area:     4.15 cm LV E/e' lateral: 7.5                         LV e' medial:    7.29 cm/s                         LV E/e' medial:  8.1   AORTA Ao Root diam: 3.70 cm MITRAL VALVE MV Area (PHT): 2.56 cm    SHUNTS MV Decel Time: 296 msec    Systemic Diam: 2.30 cm MV E velocity: 59.40 cm/s MV A velocity: 70.20 cm/s MV E/A ratio:  0.85 Cherlynn Kaiser MD Electronically signed by Cherlynn Kaiser MD Signature Date/Time: 08/07/2019/7:41:07 PM    Final     Cardiac Studies   ECHO: 08/07/2019   1. Suboptimal image quality for diagnostic purposes.  2. Left ventricular ejection fraction, by estimation, is 55 to 60%. The  left ventricle has normal function. Left ventricular endocardial border  not optimally defined to evaluate regional wall motion. Left ventricular  diastolic parameters are consistent  with Grade I diastolic dysfunction (impaired relaxation).  3. Right ventricular systolic function was not well visualized. The right  ventricular size is not well visualized.  4. The mitral valve is grossly normal. No evidence of mitral valve  regurgitation. No evidence of mitral stenosis.  5. The aortic valve was not well visualized. Aortic valve regurgitation  is not visualized. No aortic stenosis is present.   Echocardiogram 03/05/2019 1. Technically difficult study in all windows. Definity contrast shows  hyperdynamic EF, no gross focal wall motion abnormalities but limited  sensitivity for detection of minor abnormalities.  2. Left ventricular  ejection fraction, by visual estimation, is 65 to  70%. The left ventricle has hyperdynamic function. There is borderline  left ventricular hypertrophy.  3. Definity contrast agent was given IV to delineate the left ventricular  endocardial borders.  4. The left ventricle has no regional wall motion abnormalities.  5. Global right ventricle has normal systolic function.The right  ventricular size is normal. No increase in right ventricular wall  thickness.  6. Left atrial size was normal.  7. Right atrial size was normal.  8. Presence of pericardial fat pad.  9. Small pericardial effusion.  10. The mitral valve is normal in structure. No evidence of mitral valve  regurgitation.  11. The tricuspid valve is normal in structure. Tricuspid valve  regurgitation is not demonstrated.   Patient Profile     64 y.o. male with a history of chronic diastolic heart failure, obstructive sleep apnea, suspected cirrhosis of the liver, recent acute pulmonary embolism (February 2021), history of severe GI bleeding requiring transfusion (November 2020), hypertension, hyperlipidemia, morbid obesity, chronic pain due to lumbar spine spondyloarthrosis/herniated disks, chronic opiate use presenting to the emergency room due to edema and dyspnea refractory to home oral diuretics  Assessment & Plan    1. CHF:  No sig wt change overnight. He was 383 lbs 06/27/2019. I/O incomplete, may have fluid in abd, continue IV Lasix for now. R heart cath being considered>>maybe tomorrow, recheck BMET 2. Hx of PE:  neg D-dimer, on Eliquis>>discuss w/ pharmacy effectiveness of  therapy 3. OSA: extremely likely to have, consider sleep study after d/c  4. Fe def anemia: improving, continue to follow, recheck iron in a couple of months 5. Hypokalemia:  continue to supp 6. AKI: Lasix decreased to qd, recheck today pending 7. Cirrhosis/[name]: LFTs now normal, mild portal gastropathy on EGD, no varices. F/u as outpt   8. Atrial tachycardia: none in > 24 hr. Having vent ectopy>>K+  9. Tremors:  May be medication related, he has disc problems in neck and back as well. MD advise on getting neuro consult or f/u as outpt. Currently sees PCP, but feels he is getting worse.   For questions or updates, please contact [place] Please consult [email or web address] for contact info under        Signed, Rosaria Ferries, PA-C  08/08/2019, 8:25 AM    I have seen and examined the patient along with Rosaria Ferries, PA-C .  I have reviewed the chart, notes and new data.  I agree with PA/NP's note.  Key new complaints: still a little dyspneic when washing up, walking to bathroom. Has been voiding in the toilet so output not recorded. Still has edema, but has some wrinkles. Needs a taller walker.  Key examination changes: 2-3 + residual edema, clear lungs Key new findings / data: K now normal, creatinine remains mildly elevated, but not much changed since yesterday.  PLAN: I think he needs a right heart cath tomorrow to clarify the cause of dyspnea and edema - I find the diagnosis of diastolic left heart failure doubtful. He may have PAH and R heart failure (for example due to CTEPH or untreated OSA). This procedure has been fully reviewed with the patient and written informed consent has been obtained.   Sanda Klein, MD, [place] [phone] 08/08/2019, 12:11 PM

## 2019-08-08 NOTE — TOC Initial Note (Signed)
Transition of Care [facility]) - Initial/Assessment Note    Patient Details  Name: Charles Gray MRN: [id number] Date of Birth: [birth year]  [facility]) CM/SW Contact:    Bethena Roys, RN Phone Number: 08/08/2019, 3:25 PM  Clinical Narrative: Patient presented for shortness of breath and edema. Prior to arrival patient was from home with the support of his son. Patient states his son takes him to all appointments. Patient has a primary care provider and he uses [facility] for medications. Pt is currently active with [facility] for Registered Nurse and Physical Therapy-pt will need resumption orders and F2F once stable.                Expected Discharge Plan: [place] Barriers to Discharge: Continued Medical Work up  Patient Goals and CMS Choice Patient states their goals for this hospitalization and ongoing recovery are:: to return home   Choice offered to / list presented to : NA  Expected Discharge Plan and Services Expected Discharge Plan: [place] In-house Referral: NA Discharge Planning Services: CM Consult Post Acute Care Choice: Resumption of Svcs/PTA Provider, Home Health Living arrangements for the past 2 months: Apartment                 DME Arranged: N/A   HH Arranged: RN, Disease Management, PT [place] Agency: [place] ([facility]) Date HH Agency Contacted: 08/08/19 Time HH Agency Contacted: 1525 Representative spoke with at [place]: Butch Penny  Prior Living Arrangements/Services Living arrangements for the past 2 months: Apartment Lives with:: Self(Has support of son.) Patient language and need for interpreter reviewed:: Yes Do you feel safe going back to the place where you live?: Yes      Need for Family Participation in Patient Care: Yes (Comment) Care giver support system in place?: Yes (comment) Current home services: DME(Pt has DME RW and shower seat.) Criminal Activity/Legal  Involvement Pertinent to Current Situation/Hospitalization: No - Comment as needed  Activities of Daily Living Home Assistive Devices/Equipment: Environmental consultant (specify type), Shower chair with back, Cane (specify quad or straight) ADL Screening (condition at time of admission) Patient's cognitive ability adequate to safely complete daily activities?: Yes Is the patient deaf or have difficulty hearing?: No Does the patient have difficulty seeing, even when wearing glasses/contacts?: No Does the patient have difficulty concentrating, remembering, or making decisions?: No Patient able to express need for assistance with ADLs?: Yes Does the patient have difficulty dressing or bathing?: No Independently performs ADLs?: Yes (appropriate for developmental age) Does the patient have difficulty walking or climbing stairs?: Yes Weakness of Legs: Both Weakness of Arms/Hands: None  Permission Sought/Granted Permission sought to share information with : Family Supports, Chartered certified accountant granted to share information with : Yes, Verbal Permission Granted     Permission granted to share info w AGENCY: [facility]   Emotional Assessment Appearance:: Appears stated age Attitude/Demeanor/Rapport: Engaged Affect (typically observed): Appropriate Orientation: : Oriented to Situation, Oriented to  Time, Oriented to Place, Oriented to Self Alcohol / Substance Use: Not Applicable Psych Involvement: No (comment)  Admission diagnosis:  Acute on chronic diastolic heart failure (HCC) [I50.33] Hypokalemia [E87.6] SOB (shortness of breath) [R06.02] Patient Active Problem List   Diagnosis Date Noted  . Acute on chronic diastolic heart failure ([place]) 08/06/2019  . Pulmonary embolism ([place]) 06/08/2019  . Normocytic anemia 06/08/2019  . Pulmonary emboli ([place]) 06/08/2019  . Chronic heart failure with preserved ejection fraction (  [place]) 05/30/2019  . Morbid obesity with BMI of 40.0-44.9,  adult ([place]) 05/30/2019  . Hyponatremia   . Benign essential HTN   . Drug induced constipation   . Peripheral edema   . Hypotension due to drugs   . Hypoalbuminemia due to protein-calorie malnutrition ([place])   . Hypokalemia   . Thrombocytopenia ([place])   . Anemia of chronic disease   . Cirrhosis of liver without ascites ([place])   . Chronic pain syndrome   . Debility 03/13/2019  . Acute respiratory failure with hypoxia ([place])   . Shock ([place])   . Dyspnea   . GIB (gastrointestinal bleeding) 03/04/2019  . Hyperlipidemia 01/24/2019  . Hypertension 01/24/2019  . Insomnia 01/24/2019   PCP:  Enid Skeens., MD Pharmacy:   [facility], [place] [address] Phone: [phone] Fax: [place], [place] - [address] [place] [address] Phone: [phone] Fax: [phone]   Readmission Risk Interventions Readmission Risk Prevention Plan 08/08/2019  Transportation Screening Complete  HRI or [place] Complete  Social Work Consult for [place] Planning/Counseling Complete  Medication Review Press photographer) Complete

## 2019-08-09 ENCOUNTER — Encounter (HOSPITAL_COMMUNITY)
Admission: EM | Disposition: A | Discharge status: Home Health | Payer: Self-pay | Source: Home / Self Care | Attending: Cardiovascular Disease

## 2019-08-09 DIAGNOSIS — I509 Heart failure, unspecified: Secondary | ICD-10-CM

## 2019-08-09 HISTORY — PX: RIGHT HEART CATH: CATH118263

## 2019-08-09 LAB — POCT I-STAT EG7
Acid-Base Excess: 10 mmol/L — ABNORMAL HIGH (ref 0.0–2.0)
Acid-Base Excess: 9 mmol/L — ABNORMAL HIGH (ref 0.0–2.0)
Bicarbonate: 34.1 mmol/L — ABNORMAL HIGH (ref 20.0–28.0)
Bicarbonate: 35.1 mmol/L — ABNORMAL HIGH (ref 20.0–28.0)
Calcium, Ion: 1.15 mmol/L (ref 1.15–1.40)
Calcium, Ion: 1.18 mmol/L (ref 1.15–1.40)
HCT: 35 % — ABNORMAL LOW (ref 39.0–52.0)
HCT: 35 % — ABNORMAL LOW (ref 39.0–52.0)
Hemoglobin: 11.9 g/dL — ABNORMAL LOW (ref 13.0–17.0)
Hemoglobin: 11.9 g/dL — ABNORMAL LOW (ref 13.0–17.0)
O2 Saturation: 71 %
O2 Saturation: 71 %
Potassium: 3.8 mmol/L (ref 3.5–5.1)
Potassium: 3.8 mmol/L (ref 3.5–5.1)
Sodium: 136 mmol/L (ref 135–145)
Sodium: 137 mmol/L (ref 135–145)
TCO2: 36 mmol/L — ABNORMAL HIGH (ref 22–32)
TCO2: 37 mmol/L — ABNORMAL HIGH (ref 22–32)
pCO2, Ven: 49.1 mmHg (ref 44.0–60.0)
pCO2, Ven: 49.4 mmHg (ref 44.0–60.0)
pH, Ven: 7.45 — ABNORMAL HIGH (ref 7.250–7.430)
pH, Ven: 7.459 — ABNORMAL HIGH (ref 7.250–7.430)
pO2, Ven: 36 mmHg (ref 32.0–45.0)
pO2, Ven: 36 mmHg (ref 32.0–45.0)

## 2019-08-09 LAB — BASIC METABOLIC PANEL
Anion gap: 13 (ref 5–15)
BUN: 24 mg/dL — ABNORMAL HIGH (ref 8–23)
CO2: 29 mmol/L (ref 22–32)
Calcium: 9.2 mg/dL (ref 8.9–10.3)
Chloride: 93 mmol/L — ABNORMAL LOW (ref 98–111)
Creatinine, Ser: 1.14 mg/dL (ref 0.61–1.24)
GFR calc Af Amer: >60 mL/min (ref >60)
GFR calc non Af Amer: >60 mL/min (ref >60)
Glucose, Bld: 112 mg/dL — ABNORMAL HIGH (ref 70–99)
Potassium: 3.7 mmol/L (ref 3.5–5.1)
Sodium: 135 mmol/L (ref 135–145)

## 2019-08-09 SURGERY — RIGHT HEART CATH
Anesthesia: LOCAL

## 2019-08-09 MED ORDER — LIDOCAINE HCL (PF) 1 % IJ SOLN
INTRAMUSCULAR | Status: AC
  Filled 2019-08-09: qty 30

## 2019-08-09 MED ORDER — HEPARIN (PORCINE) IN NACL 1000-0.9 UT/500ML-% IV SOLN
INTRAVENOUS | Status: AC
  Filled 2019-08-09: qty 500

## 2019-08-09 MED ORDER — HEPARIN (PORCINE) IN NACL 1000-0.9 UT/500ML-% IV SOLN
INTRAVENOUS | Status: DC | PRN
  Administered 2019-08-09: 500 mL

## 2019-08-09 MED ORDER — LIDOCAINE HCL (PF) 1 % IJ SOLN
INTRAMUSCULAR | Status: DC | PRN
  Administered 2019-08-09: 2 mL via INTRADERMAL

## 2019-08-09 SURGICAL SUPPLY — 6 items
CATH BALLN WEDGE 5F 110CM (CATHETERS) ×2 IMPLANT
PACK CARDIAC CATHETERIZATION (CUSTOM PROCEDURE TRAY) ×2 IMPLANT
PROTECTION STATION PRESSURIZED (MISCELLANEOUS) ×2
SHEATH GLIDE SLENDER 4/5FR (SHEATH) ×2 IMPLANT
STATION PROTECTION PRESSURIZED (MISCELLANEOUS) ×1 IMPLANT
TRANSDUCER W/STOPCOCK (MISCELLANEOUS) ×2 IMPLANT

## 2019-08-09 NOTE — Progress Notes (Addendum)
Progress Note  Patient Name: Charles Gray Date of Encounter: 08/09/2019  Primary Cardiologist: Berniece Salines, DO   Subjective   Patient put out 2L urine overnight. Plan for RHC today. Denies chest pain.   Inpatient Medications    Scheduled Meds: . buprenorphine-naloxone  1 tablet Sublingual BID  . furosemide  80 mg Intravenous Daily  . gabapentin  800 mg Oral TID  . lisinopril  15 mg Oral Daily  . pantoprazole  40 mg Oral BID  . polyethylene glycol  17 g Oral Daily  . potassium chloride  40 mEq Oral BID  . pravastatin  20 mg Oral Daily  . sodium chloride flush  3 mL Intravenous Once  . sodium chloride flush  3 mL Intravenous Q12H   Continuous Infusions: . sodium chloride    . sodium chloride 50 mL/hr at 08/09/19 0645   PRN Meds: sodium chloride, acetaminophen, albuterol, nitroGLYCERIN, ondansetron (ZOFRAN) IV, sodium chloride flush, zolpidem   Vital Signs    Vitals:   08/08/19 1125 08/08/19 1557 08/08/19 1935 08/09/19 0519  BP: 108/66 121/71 124/67 122/74  Pulse: 80 80 85 82  Resp: 17     Temp: 98.1 F (36.7 C) 98.3 F (36.8 C) 98.4 F (36.9 C) 98.2 F (36.8 C)  TempSrc: Oral Oral Oral Oral  SpO2:  97% 99% 100%  Weight:    (!) 170.8 kg  Height:        Intake/Output Summary (Last 24 hours) at 08/09/2019 0801 Last data filed at 08/09/2019 0645 Gross per 24 hour  Intake 1346.89 ml  Output 2000 ml  Net -653.11 ml   Last 3 Weights 08/09/2019 08/08/2019 08/07/2019  Weight (lbs) 376 lb 8 oz 375 lb 11.2 oz 375 lb  Weight (kg) 170.779 kg 170.416 kg 170.099 kg      Telemetry    NSR HR70-80s, no further atrial tachycardia - Personally Reviewed  ECG    No new - Personally Reviewed  Physical Exam   GEN: No acute distress.   Neck: No JVD Cardiac: RRR, no murmurs, rubs, or gallops.  Respiratory: Clear to auscultation bilaterally. GI: firm  MS: 1-2+ edema; No deformity. Neuro:  Nonfocal  Psych: Normal affect   Labs    High Sensitivity Troponin:     Recent Labs  Lab 08/06/19 1728  TROPONINIHS 5      Chemistry Recent Labs  Lab 08/06/19 0554 08/07/19 0409 08/08/19 0931  NA 135 136 134*  K 2.9* 3.2* 3.8  CL 89* 91* 92*  CO2 30 32 30  GLUCOSE 119* 123* 93  BUN 17 23 29*  CREATININE 1.11 1.49* 1.55*  CALCIUM 9.8 9.5 9.5  GFRNONAA >60 49* 47*  GFRAA >60 57* 54*  ANIONGAP 16* 13 12     Hematology Recent Labs  Lab 08/06/19 0554  WBC 9.3  RBC 5.02  HGB 12.1*  HCT 39.6  MCV 78.9*  MCH 24.1*  MCHC 30.6  RDW 15.9*  PLT 166    BNP Recent Labs  Lab 08/06/19 0554  BNP 13.7     DDimer  Recent Labs  Lab 08/06/19 1728  DDIMER <0.27     Radiology    ECHOCARDIOGRAM COMPLETE  Result Date: 08/07/2019    ECHOCARDIOGRAM REPORT   Patient Name:   Charles Gray Date of Exam: 08/07/2019 Medical Rec #:  [id number]       Height:       82.0 in Accession #:    [id number]      Weight:  383.6 lb Date of Birth:  [birth year]       BSA:          3.088 m Patient Age:    64 years        BP:           133/83 mmHg Patient Gender: M               HR:           70 bpm. Exam Location:  Inpatient Procedure: 2D Echo and Intracardiac Opacification Agent Indications:    Pulmonary hypertension 416.8 / I27.2  History:        Patient has no prior history of Echocardiogram examinations,                 most recent 06/08/2019. CHF; Risk Factors:Sleep Apnea,                 Hypertension and Dyslipidemia. Cirrhosis of the liver, Acute                 pulmonary embolism 2/21, Thrombocytopenia, Edema.  Sonographer:    Darlina Sicilian RDCS Referring Phys: [id number] Darreld Mclean  Sonographer Comments: Technically difficult study due to poor echo windows and suboptimal apical window. Echo performed with the patient upright for comfort. IMPRESSIONS  1. Suboptimal image quality for diagnostic purposes.  2. Left ventricular ejection fraction, by estimation, is 55 to 60%. The left ventricle has normal function. Left ventricular endocardial border not optimally  defined to evaluate regional wall motion. Left ventricular diastolic parameters are consistent with Grade I diastolic dysfunction (impaired relaxation).  3. Right ventricular systolic function was not well visualized. The right ventricular size is not well visualized.  4. The mitral valve is grossly normal. No evidence of mitral valve regurgitation. No evidence of mitral stenosis.  5. The aortic valve was not well visualized. Aortic valve regurgitation is not visualized. No aortic stenosis is present. FINDINGS  Left Ventricle: Left ventricular ejection fraction, by estimation, is 55 to 60%. The left ventricle has normal function. Left ventricular endocardial border not optimally defined to evaluate regional wall motion. Definity contrast agent was given IV to delineate the left ventricular endocardial borders. The left ventricular internal cavity size was normal in size. Left ventricular diastolic parameters are consistent with Grade I diastolic dysfunction (impaired relaxation). Right Ventricle: The right ventricular size is not well visualized. Right vetricular wall thickness was not assessed. Right ventricular systolic function was not well visualized. Left Atrium: Left atrial size was not well visualized. Right Atrium: Right atrial size was not well visualized. Pericardium: The pericardium was not well visualized. Presence of pericardial fat pad. Mitral Valve: The mitral valve is grossly normal. No evidence of mitral valve regurgitation. No evidence of mitral valve stenosis. Tricuspid Valve: The tricuspid valve is not well visualized. Tricuspid valve regurgitation is not demonstrated. Aortic Valve: The aortic valve was not well visualized. Aortic valve regurgitation is not visualized. No aortic stenosis is present. Pulmonic Valve: The pulmonic valve was not well visualized. Pulmonic valve regurgitation is trivial. Aorta: Aortic root measures 37 mm which may be within normal limits for age when indexed to BSA.  The aortic root was not well visualized. Venous: The inferior vena cava was not well visualized. IAS/Shunts: The interatrial septum was not well visualized.  LEFT VENTRICLE PLAX 2D                 Diastology LVOT diam:     2.30 cm  LV e' lateral:   7.94 cm/s LVOT Area:     4.15 cm LV E/e' lateral: 7.5                         LV e' medial:    7.29 cm/s                         LV E/e' medial:  8.1   AORTA Ao Root diam: 3.70 cm MITRAL VALVE MV Area (PHT): 2.56 cm    SHUNTS MV Decel Time: 296 msec    Systemic Diam: 2.30 cm MV E velocity: 59.40 cm/s MV A velocity: 70.20 cm/s MV E/A ratio:  0.85 Cherlynn Kaiser MD Electronically signed by Cherlynn Kaiser MD Signature Date/Time: 08/07/2019/7:41:07 PM    Final     Cardiac Studies   Echo 08/07/19 1. Suboptimal image quality for diagnostic purposes.  2. Left ventricular ejection fraction, by estimation, is 55 to 60%. The  left ventricle has normal function. Left ventricular endocardial border  not optimally defined to evaluate regional wall motion. Left ventricular  diastolic parameters are consistent  with Grade I diastolic dysfunction (impaired relaxation).  3. Right ventricular systolic function was not well visualized. The right  ventricular size is not well visualized.  4. The mitral valve is grossly normal. No evidence of mitral valve  regurgitation. No evidence of mitral stenosis.  5. The aortic valve was not well visualized. Aortic valve regurgitation  is not visualized. No aortic stenosis is present.   Patient Profile     64 y.o. male with a history of chronic diastolic heart failure, obstructive sleep apnea, suspected cirrhosis of the liver, recent acute pulmonary embolism (February 2021), history of severe GI bleeding requiring transfusion (November 2020), hypertension, hyperlipidemia, morbid obesity, chronic pain due to lumbar spine spondyloarthrosis/herniated disks, chronic opiate use presenting to the emergency room due to edema and  dyspnea refractory to home oral diuretics  Assessment & Plan    Acute on Chronic Diastolic CHF - EF this admission 55-60% - On IV lasix 80 mg daily - Since admission he has put out -1.4L - Weights unchanged since admission - creatinine 1.55>1.14 - With persistent edema and sob plan for RHC today. Suspect possible Right sided HF vs Pulm HTN  H/o of PE in February 2021 - negative D-dimer - Eliquis held for cath. Restart after cath  Atrial tachycardia - K+ goal>4, Mag >2 - On supplemental K+  OSA - consider sleep study as OP  Cirrhosis/[name] - LFTs normal - F/u as OP  Tremors - consider neuro consult as outpatient    For questions or updates, please contact [facility] Please consult [email or web address] for contact info under        Signed, Cadence Ninfa Meeker, PA-C  08/09/2019, 8:01 AM    I have seen and examined the patient along with Cadence Ninfa Meeker, PA-C .  I have reviewed the chart, notes and new data.  I agree with PA/NP's note.  Key new complaints: Denies dyspnea.  Edema improving, but still substantial swelling of both feet. Key examination changes: 2-3+ edema, with some wrinkling around the ankles.  Maintaining normal rhythm. Key new findings / data: Frequent PACs, very brief PAT on monitor, no sustained arrhythmia  PLAN: For right heart catheterization today. This procedure has been fully reviewed with the patient and written informed consent has been obtained.   Sanda Klein, MD, [place] [phone] 08/09/2019, 10:25  AM

## 2019-08-09 NOTE — Progress Notes (Signed)
Charles Gray

## 2019-08-10 DIAGNOSIS — I50813 Acute on chronic right heart failure: Secondary | ICD-10-CM | POA: Diagnosis not present

## 2019-08-10 DIAGNOSIS — I471 Supraventricular tachycardia: Secondary | ICD-10-CM | POA: Diagnosis not present

## 2019-08-10 DIAGNOSIS — I2781 Cor pulmonale (chronic): Secondary | ICD-10-CM | POA: Diagnosis not present

## 2019-08-10 DIAGNOSIS — G4733 Obstructive sleep apnea (adult) (pediatric): Secondary | ICD-10-CM | POA: Diagnosis not present

## 2019-08-10 LAB — BASIC METABOLIC PANEL
Anion gap: 11 (ref 5–15)
BUN: 22 mg/dL (ref 8–23)
CO2: 29 mmol/L (ref 22–32)
Calcium: 9 mg/dL (ref 8.9–10.3)
Chloride: 95 mmol/L — ABNORMAL LOW (ref 98–111)
Creatinine, Ser: 1.12 mg/dL (ref 0.61–1.24)
GFR calc Af Amer: >60 mL/min (ref >60)
GFR calc non Af Amer: >60 mL/min (ref >60)
Glucose, Bld: 101 mg/dL — ABNORMAL HIGH (ref 70–99)
Potassium: 4 mmol/L (ref 3.5–5.1)
Sodium: 135 mmol/L (ref 135–145)

## 2019-08-10 LAB — MAGNESIUM: Magnesium: 2.1 mg/dL (ref 1.7–2.4)

## 2019-08-10 MED ORDER — APIXABAN 5 MG PO TABS
5.0000 mg | ORAL_TABLET | Freq: Two times a day (BID) | ORAL | Status: DC
  Administered 2019-08-10 – 2019-08-12 (×5): 5 mg via ORAL
  Filled 2019-08-10 (×5): qty 1

## 2019-08-10 MED ORDER — LISINOPRIL 5 MG PO TABS
5.0000 mg | ORAL_TABLET | Freq: Every day | ORAL | Status: DC
  Administered 2019-08-11 – 2019-08-12 (×2): 5 mg via ORAL
  Filled 2019-08-10 (×2): qty 1

## 2019-08-10 MED ORDER — FUROSEMIDE 80 MG PO TABS
80.0000 mg | ORAL_TABLET | Freq: Two times a day (BID) | ORAL | Status: DC
  Administered 2019-08-10 – 2019-08-12 (×4): 80 mg via ORAL
  Filled 2019-08-10 (×4): qty 1

## 2019-08-10 NOTE — Progress Notes (Signed)
Per Dr. Kalman Shan ok for lab to draw am labs early, RN called and spoke with phlebotomist.

## 2019-08-10 NOTE — Progress Notes (Addendum)
Progress Note  Patient Name: Charles Gray Date of Encounter: 08/10/2019  Primary Cardiologist: Berniece Salines, DO   Subjective   RHC was normal. Patient put out 2.3 L urine overnight.  Brief run of atrial tachycardia overnight. Patient is overall feeling better but says he sometimes feels dizzy when walking around. Right brachial cath site with minimal tenderness.   Inpatient Medications    Scheduled Meds: . buprenorphine-naloxone  1 tablet Sublingual BID  . furosemide  80 mg Intravenous Daily  . gabapentin  800 mg Oral TID  . lisinopril  15 mg Oral Daily  . pantoprazole  40 mg Oral BID  . polyethylene glycol  17 g Oral Daily  . potassium chloride  40 mEq Oral BID  . pravastatin  20 mg Oral Daily  . sodium chloride flush  3 mL Intravenous Once   Continuous Infusions:  PRN Meds: acetaminophen, albuterol, nitroGLYCERIN, ondansetron (ZOFRAN) IV, zolpidem   Vital Signs    Vitals:   08/09/19 2219 08/10/19 0149 08/10/19 0457 08/10/19 0500  BP: 128/71 99/63 106/76   Pulse:  83 65   Resp:  12 12   Temp:  98.4 F (36.9 C) 98 F (36.7 C)   TempSrc:  Oral Oral   SpO2:  97% 98%   Weight:    (!) 172.1 kg  Height:        Intake/Output Summary (Last 24 hours) at 08/10/2019 0734 Last data filed at 08/10/2019 0507 Gross per 24 hour  Intake 1662 ml  Output 2325 ml  Net -663 ml   Last 3 Weights 08/10/2019 08/09/2019 08/08/2019  Weight (lbs) 379 lb 8 oz 376 lb 8 oz 375 lb 11.2 oz  Weight (kg) 172.14 kg 170.779 kg 170.416 kg      Telemetry    NSR, HR70-80, brief run of atrial tachycardia - Personally Reviewed  ECG    No new - Personally Reviewed  Physical Exam   GEN: No acute distress.   Neck: No JVD Cardiac: RRR, no murmurs, rubs, or gallops.  Respiratory: Clear to auscultation bilaterally. GI: Soft, nontender, non-distended  MS: 1+ edema; No deformity; right brachial cath site minimally tender Neuro:  Nonfocal  Psych: Normal affect   Labs    High Sensitivity  Troponin:   Recent Labs  Lab 08/06/19 1728  TROPONINIHS 5      Chemistry Recent Labs  Lab 08/08/19 0931 08/08/19 0931 08/09/19 0651 08/09/19 1355 08/10/19 0313  NA 134*   < > 135 136  137 135  K 3.8   < > 3.7 3.8  3.8 4.0  CL 92*  --  93*  --  95*  CO2 30  --  29  --  29  GLUCOSE 93  --  112*  --  101*  BUN 29*  --  24*  --  22  CREATININE 1.55*  --  1.14  --  1.12  CALCIUM 9.5  --  9.2  --  9.0  GFRNONAA 47*  --  >60  --  >60  GFRAA 54*  --  >60  --  >60  ANIONGAP 12  --  13  --  11   < > = values in this interval not displayed.     Hematology Recent Labs  Lab 08/06/19 0554 08/09/19 1355  WBC 9.3  --   RBC 5.02  --   HGB 12.1* 11.9*  11.9*  HCT 39.6 35.0*  35.0*  MCV 78.9*  --   MCH 24.1*  --  MCHC 30.6  --   RDW 15.9*  --   PLT 166  --     BNP Recent Labs  Lab 08/06/19 0554  BNP 13.7     DDimer  Recent Labs  Lab 08/06/19 1728  DDIMER <0.27     Radiology    CARDIAC CATHETERIZATION  Result Date: 08/09/2019 1. Normal filling pressures. 2. Normal PA pressure. 3. Normal cardiac output.    Cardiac Studies   Echo 08/07/19 1. Suboptimal image quality for diagnostic purposes.  2. Left ventricular ejection fraction, by estimation, is 55 to 60%. The  left ventricle has normal function. Left ventricular endocardial border  not optimally defined to evaluate regional wall motion. Left ventricular  diastolic parameters are consistent  with Grade I diastolic dysfunction (impaired relaxation).  3. Right ventricular systolic function was not well visualized. The right  ventricular size is not well visualized.  4. The mitral valve is grossly normal. No evidence of mitral valve  regurgitation. No evidence of mitral stenosis.  5. The aortic valve was not well visualized. Aortic valve regurgitation  is not visualized. No aortic stenosis is present.   [place] 08/09/19 1. Normal filling pressures.  2. Normal PA pressure.  3. Normal cardiac output.   Patient Profile     64 y.o. male with a history of chronic diastolic heart failure, obstructive sleep apnea, suspected cirrhosis of the liver, recent acute pulmonary embolism (February 2021), history of severe GI bleeding requiring transfusion (November 2020), hypertension, hyperlipidemia, morbid obesity, chronic pain due to lumbar spine spondyloarthrosis/herniated disks, chronic opiate use presenting to the emergency room due to edema and dyspnea refractory to home oral diuretics.  Assessment & Plan    Acute on Chronic Diastolic CHF - EF this admission 55-60% - On IV lasix 80 mg daily - Patient put out 2.3L urine overnight and is net -2.1L  - Weights not accurate. Re-weigh - creatinine 1.55>1.14>1.12 - RHC showed normal filling pressures, normal PA pressure, normal CO - Edema is improving. Patient has some low Bps possibly explaining his dizziness.   H/o of PE in February 2021 - negative D-dimer - Eliquis held for cath. Restart after cath  Atrial tachycardia - Brief run overnight - K+ 4.0 - Mag 2.1 - K+ goal>4, Mag >2 - On supplemental K+  HTN - Lisinopril 15 mg daily - IV lasix - has some intermittently low pressures  OSA - consider sleep study as OP  Cirrhosis/[name] - LFTs normal - F/u as OP  Tremors - consider neuro consult as outpatient  For questions or updates, please contact [facility] Please consult [email or web address] for contact info under        Signed, Cadence Ninfa Meeker, PA-C  08/10/2019, 7:34 AM    I have seen and examined the patient along with Cadence Ninfa Meeker, PA-C .  I have reviewed the chart, notes and new data.  I agree with PA/NP's note.  Key new complaints: Has some orthostatic dizziness, suggesting we need to slow down his diuresis.  Can walk the hallways without hypoxia or dyspnea.  He still has substantial pedal edema. Key examination changes: 2+ pedal and ankle edema, symmetrical with chronic brawny edema skin changes Key new findings /  data: Normal electrolytes and creatinine stable at 1.1.  Weight is increased, but I am not sure of the accuracy.  He had a 23 beat run of nonsustained paroxysmal atrial tachycardia that occurred just before midnight, while he was asleep.  PLAN: He has predominantly, possibly entirely right  heart failure, with normal systolic function and very little (if any) evidence for diastolic dysfunction. We will switch to p.o. diuretics.  Decrease the dose of lisinopril. His dyspnea appears to be related to lung problems, at least in part untreated obstructive sleep apnea.  PFTs are pending. Had a lengthy discussion about the difference between right heart failure and left heart failure.  Discussed the need for sodium restriction.  His diet contains many processed and canned foods that are very high in sodium.  He often eats at [employer].  Recommended switching to fresh or frozen ingredients and avoiding fast food that is often very high in sodium.  Also reviewed the importance of daily weight monitoring. Encouraged him to reconsider treatment for sleep apnea.  He reports being intolerant to CPAP in the past.  At a minimum, he should try to get a jaw advancement device from a dentist. He is concerned about tremors which probably represent essential tremor but could be the initial signs of Parkinson's disease.  Would recommend outpatient neurology referral at discharge.  Sanda Klein, MD, [place] [phone] 08/10/2019, 11:31 AM

## 2019-08-10 NOTE — Care Management Important Message (Signed)
Important Message  Patient Details  Name: Charles Gray MRN: [id number] Date of Birth: Aug 12, 1955   Medicare Important Message Given:  Yes     Shelda Altes 08/10/2019, 10:30 AM

## 2019-08-10 NOTE — Progress Notes (Signed)
Patient had a 14 beat run of tachycardia, RN text paged Cardiology.  Dr. Kalman Shan returned paged, orders received.

## 2019-08-11 ENCOUNTER — Inpatient Hospital Stay (HOSPITAL_COMMUNITY): Payer: Medicare Other

## 2019-08-11 LAB — PULMONARY FUNCTION TEST
FEF 25-75 Pre: 3.29 L/sec
FEF2575-%Pred-Pre: 81 %
FEV1-%Pred-Pre: 68 %
FEV1-Pre: 3.56 L
FEV1FVC-%Pred-Pre: 101 %
FEV6-%Pred-Pre: 69 %
FEV6-Pre: 4.56 L
FEV6FVC-%Pred-Pre: 101 %
FVC-%Pred-Pre: 68 %
Pre FEV1/FVC ratio: 76 %
Pre FEV6/FVC Ratio: 98 %

## 2019-08-11 LAB — BASIC METABOLIC PANEL
Anion gap: 9 (ref 5–15)
BUN: 19 mg/dL (ref 8–23)
CO2: 28 mmol/L (ref 22–32)
Calcium: 8.9 mg/dL (ref 8.9–10.3)
Chloride: 98 mmol/L (ref 98–111)
Creatinine, Ser: 0.96 mg/dL (ref 0.61–1.24)
GFR calc Af Amer: >60 mL/min (ref >60)
GFR calc non Af Amer: >60 mL/min (ref >60)
Glucose, Bld: 119 mg/dL — ABNORMAL HIGH (ref 70–99)
Potassium: 3.9 mmol/L (ref 3.5–5.1)
Sodium: 135 mmol/L (ref 135–145)

## 2019-08-11 NOTE — Plan of Care (Signed)

## 2019-08-11 NOTE — Progress Notes (Addendum)
Progress Note  Patient Name: Charles Gray Date of Encounter: 08/11/2019  Primary Cardiologist: Berniece Salines, DO   Subjective   Patient put out 2.3L urine overnight. Weights not accurate. PFTs ordered. Swelling is improving. Did have some fleeting chest pain last night reminiscent of when he had his PE.   Inpatient Medications    Scheduled Meds: . apixaban  5 mg Oral BID  . buprenorphine-naloxone  1 tablet Sublingual BID  . furosemide  80 mg Oral BID  . gabapentin  800 mg Oral TID  . lisinopril  5 mg Oral Daily  . pantoprazole  40 mg Oral BID  . polyethylene glycol  17 g Oral Daily  . potassium chloride  40 mEq Oral BID  . pravastatin  20 mg Oral Daily   Continuous Infusions:   PRN Meds: acetaminophen, albuterol, nitroGLYCERIN, ondansetron (ZOFRAN) IV, zolpidem   Vital Signs    Vitals:   08/10/19 0810 08/10/19 1243 08/10/19 1943 08/11/19 0350  BP: (!) 125/107 107/68 113/67 124/70  Pulse: 83 79 74 76  Resp: 15 20 18 17   Temp: 98.1 F (36.7 C) 98 F (36.7 C) 97.8 F (36.6 C) 98.1 F (36.7 C)  TempSrc: Oral Oral Oral Oral  SpO2: 98% 95% 100% 100%  Weight:    (!) 172.4 kg  Height:        Intake/Output Summary (Last 24 hours) at 08/11/2019 0734 Last data filed at 08/10/2019 2247 Gross per 24 hour  Intake 1044 ml  Output 2375 ml  Net -1331 ml   Last 3 Weights 08/11/2019 08/10/2019 08/09/2019  Weight (lbs) 380 lb 1.2 oz 379 lb 8 oz 376 lb 8 oz  Weight (kg) 172.4 kg 172.14 kg 170.779 kg      Telemetry    NSR, HR 70-80s - Personally Reviewed  ECG    No new - Personally Reviewed  Physical Exam   GEN: No acute distress.   Neck: No JVD Cardiac: RRR, no murmurs, rubs, or gallops.  Respiratory: Clear to auscultation bilaterally. GI: Soft, nontender, non-distended  MS: 1+ edema; No deformity. Neuro:  Nonfocal  Psych: Normal affect   Labs    High Sensitivity Troponin:   Recent Labs  Lab 08/06/19 1728  TROPONINIHS 5      Chemistry Recent Labs    Lab 08/09/19 0651 08/09/19 0651 08/09/19 1355 08/10/19 0313 08/11/19 0323  NA 135   < > 136  137 135 135  K 3.7   < > 3.8  3.8 4.0 3.9  CL 93*  --   --  95* 98  CO2 29  --   --  29 28  GLUCOSE 112*  --   --  101* 119*  BUN 24*  --   --  22 19  CREATININE 1.14  --   --  1.12 0.96  CALCIUM 9.2  --   --  9.0 8.9  GFRNONAA >60  --   --  >60 >60  GFRAA >60  --   --  >60 >60  ANIONGAP 13  --   --  11 9   < > = values in this interval not displayed.     Hematology Recent Labs  Lab 08/06/19 0554 08/09/19 1355  WBC 9.3  --   RBC 5.02  --   HGB 12.1* 11.9*  11.9*  HCT 39.6 35.0*  35.0*  MCV 78.9*  --   MCH 24.1*  --   MCHC 30.6  --   RDW 15.9*  --  PLT 166  --     BNP Recent Labs  Lab 08/06/19 0554  BNP 13.7     DDimer  Recent Labs  Lab 08/06/19 1728  DDIMER <0.27     Radiology    CARDIAC CATHETERIZATION  Result Date: 08/09/2019 1. Normal filling pressures. 2. Normal PA pressure. 3. Normal cardiac output.    Cardiac Studies   Echo 08/07/19 1. Suboptimal image quality for diagnostic purposes.   2. Left ventricular ejection fraction, by estimation, is 55 to 60%. The  left ventricle has normal function. Left ventricular endocardial border  not optimally defined to evaluate regional wall motion. Left ventricular  diastolic parameters are consistent  with Grade I diastolic dysfunction (impaired relaxation).   3. Right ventricular systolic function was not well visualized. The right  ventricular size is not well visualized.   4. The mitral valve is grossly normal. No evidence of mitral valve  regurgitation. No evidence of mitral stenosis.   5. The aortic valve was not well visualized. Aortic valve regurgitation  is not visualized. No aortic stenosis is present.    [place] 08/09/19 1. Normal filling pressures.  2. Normal PA pressure.  3. Normal cardiac output.  Patient Profile     64 y.o. male  with a history of chronic diastolic heart failure,  obstructive sleep apnea, suspected cirrhosis of the liver, recent acute pulmonary embolism (February 2021), history of severe GI bleeding requiring transfusion (November 2020), hypertension, hyperlipidemia, morbid obesity, chronic pain due to lumbar spine spondyloarthrosis/herniated disks, chronic opiate use presenting to the emergency room due to edema and dyspnea refractory to home oral diuretics.  Assessment & Plan    Acute on Chronic Diastolic CHF/Rt HF - EF this admission 55-60% - Was on IV lasix 80 mg daily - Patient put out 2.3L urine overnight and is net -3.4L  - Weights not accurate although was informed there is a lot of snacking/eating throughout the day - creatinine improving1.55>1.14>1.12>0.96 - Edema is improving.  - IV lasix transitioned to oral Lasix 80 mg BID. At home patient was taking Lasix 80 mg in the AM and 40 in the PM and metolazone every other day. - Discussed again low salt diet and daily weights   H/o of PE in February 2021 - negative D-dimer - Eliquis 66m BID   Atrial tachycardia - K+ 4.0 - Mag 2.1 - K+ goal>4, Mag >2 - On supplemental K+   HTN - Lisinopril 15 mg daily decreased to 535m- IV lasix - Pressures stable   OSA - PFTs ordered   Cirrhosis/[name] - LFTs normal - F/u as OP   Tremors - consider neuro consult as outpatient  For questions or updates, please contact [facility] Please consult [email or web address] for contact info under        Signed, Cadence H Ninfa MeekerPA-C  08/11/2019, 7:34 AM    I have seen and examined the patient along with Cadence H Ninfa MeekerPA-C .  I have reviewed the chart, notes and new data.  I agree with PA/NP's note.  Key new complaints: Still has lower extremity edema. Reportedly weight has increased. Key examination changes: 2+ pedal and ankle edema with chronic trophic skin changes; clear lungs, normal JVP Key new findings / data: Still waiting for PFTs. Stable renal function.  PLAN: Left heart filling pressures  were normal at cardiac catheterization, still had evidence of mildly elevated right heart pressures, consistent with chronic cor pulmonale (OSA, obesity, possible intrinsic lung disease-PFTs pending).  We will increase  his morning dose of furosemide. I am not surprised that he requires high doses of furosemide considering his body size. Reinforced the importance of sodium restricted diet and eliminating all sodium rich ingredients from his pantry (which is full of canned soups and canned vegetables). Avoid fast food restaurants and deli sandwiches. Daily weights, take metolazone 2.5 mg in the morning, 30 minutes before furosemide if his weight exceeds 389 pounds, Needs to follow-up for treatment of obstructive sleep apnea. If he does not use CPAP, at least he should receive a jaw advancement dental device. Keep lower extremities elevated, wear compression stockings.  Sanda Klein, MD, [place] [phone] 08/11/2019, 1:55 PM

## 2019-08-12 ENCOUNTER — Other Ambulatory Visit: Payer: Self-pay | Admitting: Physician Assistant

## 2019-08-12 ENCOUNTER — Encounter (HOSPITAL_COMMUNITY): Payer: Self-pay | Admitting: Cardiovascular Disease

## 2019-08-12 DIAGNOSIS — G4733 Obstructive sleep apnea (adult) (pediatric): Secondary | ICD-10-CM

## 2019-08-12 DIAGNOSIS — I471 Supraventricular tachycardia: Secondary | ICD-10-CM

## 2019-08-12 DIAGNOSIS — I5032 Chronic diastolic (congestive) heart failure: Secondary | ICD-10-CM

## 2019-08-12 DIAGNOSIS — Z86711 Personal history of pulmonary embolism: Secondary | ICD-10-CM

## 2019-08-12 DIAGNOSIS — R251 Tremor, unspecified: Secondary | ICD-10-CM

## 2019-08-12 DIAGNOSIS — I4719 Other supraventricular tachycardia: Secondary | ICD-10-CM

## 2019-08-12 HISTORY — DX: Tremor, unspecified: R25.1

## 2019-08-12 HISTORY — DX: Supraventricular tachycardia: I47.1

## 2019-08-12 HISTORY — DX: Personal history of pulmonary embolism: Z86.711

## 2019-08-12 HISTORY — DX: Obstructive sleep apnea (adult) (pediatric): G47.33

## 2019-08-12 HISTORY — DX: Other supraventricular tachycardia: I47.19

## 2019-08-12 LAB — BASIC METABOLIC PANEL
Anion gap: 10 (ref 5–15)
BUN: 17 mg/dL (ref 8–23)
CO2: 30 mmol/L (ref 22–32)
Calcium: 9.1 mg/dL (ref 8.9–10.3)
Chloride: 99 mmol/L (ref 98–111)
Creatinine, Ser: 1.08 mg/dL (ref 0.61–1.24)
GFR calc Af Amer: >60 mL/min (ref >60)
GFR calc non Af Amer: >60 mL/min (ref >60)
Glucose, Bld: 132 mg/dL — ABNORMAL HIGH (ref 70–99)
Potassium: 4.3 mmol/L (ref 3.5–5.1)
Sodium: 139 mmol/L (ref 135–145)

## 2019-08-12 MED ORDER — APIXABAN 5 MG PO TABS
5.0000 mg | ORAL_TABLET | Freq: Two times a day (BID) | ORAL | 0 refills | Status: DC
Start: 2019-08-12 — End: 2019-08-12

## 2019-08-12 MED ORDER — LISINOPRIL 5 MG PO TABS: 5.0000 mg | ORAL_TABLET | Freq: Every day | ORAL | 3 refills | Status: DC

## 2019-08-12 MED ORDER — FUROSEMIDE 80 MG PO TABS: 80.0000 mg | ORAL_TABLET | Freq: Two times a day (BID) | ORAL | 3 refills | Status: DC

## 2019-08-12 MED ORDER — APIXABAN 5 MG PO TABS
5.0000 mg | ORAL_TABLET | Freq: Two times a day (BID) | ORAL | 0 refills | Status: DC
Start: 2019-08-12 — End: 2020-11-15

## 2019-08-12 MED ORDER — POTASSIUM CHLORIDE CRYS ER 20 MEQ PO TBCR: 40.0000 meq | EXTENDED_RELEASE_TABLET | Freq: Two times a day (BID) | ORAL | 3 refills | Status: DC

## 2019-08-12 MED ORDER — METOLAZONE 5 MG PO TABS: 5.0000 mg | ORAL_TABLET | Freq: Every day | ORAL | 11 refills | Status: DC | PRN

## 2019-08-12 NOTE — TOC Transition Note (Signed)
Transition of Care [facility]) - CM/SW Discharge Note   Patient Details  Name: Charles Gray MRN: [id number] Date of Birth: 04-19-1955  Transition of Care [facility]) CM/SW Contact:  Bartholomew Crews, RN Phone Number: [phone] 08/12/2019, 1:44 PM   Clinical Narrative:     Patient transitioned home today. Noted in previous NCM note that patient had been active with [facility] for PT and RN. HH orders requested for resumption of services. Liaison at [facility] notified of discharge. No further TOC needs identfied.   Final next level of care: Home w Home Health Services Barriers to Discharge: No Barriers Identified   Patient Goals and CMS Choice Patient states their goals for this hospitalization and ongoing recovery are:: to return home   Choice offered to / list presented to : NA  Discharge Placement                       Discharge Plan and Services In-house Referral: NA Discharge Planning Services: CM Consult Post Acute Care Choice: Resumption of Svcs/PTA Provider, Home Health          DME Arranged: N/A         HH Arranged: PT, RN Trail Agency: [place] ([facility]) Date HH Agency Contacted: 08/12/19 Time [place]: [id number] Representative spoke with at [place]: [place] ([place]) Interventions     Readmission Risk Interventions Readmission Risk Prevention Plan 08/08/2019  Transportation Screening Complete  HRI or [place] Complete  Social Work Consult for [place] Planning/Counseling Complete  Medication Review Press [employer]) Complete

## 2019-08-12 NOTE — Progress Notes (Signed)
Pt's stable, DC home via wheelchair

## 2019-08-12 NOTE — Discharge Summary (Deleted)
Discharge Summary    Patient ID: Charles Gray MRN: [id number]; DOB: 10-Sep-1955  Admit date: 08/06/2019 Discharge date: 08/12/2019  Primary Care Provider: Enid Skeens., MD  Primary Cardiologist: Berniece Salines, DO  Primary Electrophysiologist:  None   Discharge Diagnoses    Principal Problem:   Acute on chronic right-sided heart failure [facility]) Active Problems:   AKI (acute kidney injury) ([place])   History of pulmonary embolism   Hypokalemia   Atrial tachycardia (HCC)   Occasional tremors   OSA (obstructive sleep apnea)    Diagnostic Studies/Procedures    Echocardiogram 08/07/2019 1. Suboptimal image quality for diagnostic purposes.  2. Left ventricular ejection fraction, by estimation, is 55 to 60%. The  left ventricle has normal function. Left ventricular endocardial border  not optimally defined to evaluate regional wall motion. Left ventricular  diastolic parameters are consistent  with Grade I diastolic dysfunction (impaired relaxation).  3. Right ventricular systolic function was not well visualized. The right  ventricular size is not well visualized.  4. The mitral valve is grossly normal. No evidence of mitral valve  regurgitation. No evidence of mitral stenosis.  5. The aortic valve was not well visualized. Aortic valve regurgitation  is not visualized. No aortic stenosis is present.   R Cardiac catheterization 08/09/2019 [facility] Procedural Findings: Hemodynamics (mmHg) RA mean 8 RV 21/8 PA 20/8, mean 14 PCWP mean 11  Oxygen saturations: PA 71% AO 100%  Cardiac Output (Fick) 8.5  Cardiac Index (Fick) 2.8  Conclusion 1. Normal filling pressures.  2. Normal PA pressure.  3. Normal cardiac output.   Pulmonary function test 08/11/2019 FVC 68% FEV1 68% FEV1/FVC 101% Mild obstructive disease _____________   History of Present Illness     Charles Gray is a 64 y.o. male with a history of chronic diastolic heart failure, obstructive sleep apnea,  suspected cirrhosis of the liver, recent acute pulmonary embolism (February 2021), history of severe GI bleeding requiring transfusion (November 2020), hypertension, hyperlipidemia, morbid obesity, chronic pain due to lumbar spine spondyloarthrosis/herniated disks, chronic opiate use presenting to the emergency room due to edema and dyspnea refractory to home oral diuretics (Furosemide and Metolazone).    Hospital Course     Consultants: none    Acute on chronic R sided CHF Patient was admitted and placed on IV diuresis.  Echocardiogram demonstrated EF 55-60 with grade 1 diastolic dysfunction.  He did have elevated creatinine with IV diuresis and his dose of furosemide was reduced.  He had minimal response to diuretic therapy and was set up for right heart catheterization.  This was performed 08/09/2019.  It demonstrated normal filling pressures.  Given his right heart cath findings, echocardiogram findings and course of admission, it was felt that he mainly has right-sided heart failure and very little, if any, evidence of diastolic dysfunction.  It is felt his R sided HF is likely related to pulmonary disease.  PFTs were ordered this admission.  The final report is pending but the preliminary result is mild obstructive disease.  Of note, weights were not felt to be accurate during his admission.  The patient was evaluated by Dr. Stanford Breed this morning.  The patient is still short of breath with exertion, but much improved since admission.  He is felt to be stable for DC to home. Rx:  Furosemide 80 mg twice daily; Metolazone 2.5 mg once daily as needed for weight gain of 2-3 lbs  Acute Kidney Injury Patient's creatinine increased with IV diuresis.  Diuretic dose was adjusted.  Creatinine improved prior to discharge.  Creatinine at discharge 1.08.  History of pulmonary embolism Patient noted compliance with Apixaban.  D-dimer was negative.  Obstructive sleep apnea Suspected.  Recommendation was to  attempt a precise diagnosis of sleep apnea with sleep study and treatment with CPAP after hospital discharge.  This can be arranged at follow up.    Iron deficiency anemia Hemoglobin stable this admission.  Hypokalemia K was 2.9 on admission.  Potassium was replaced this admission.  Potassium was 4.3 at discharge.  Atrial tachycardia Patient noted symptoms of palpitations.  Arrhythmia noted in the emergency room upon presentation with rate up in the 140s.  He had brief runs of atrial tachycardia throughout his admission.  Potassium and magnesium were kept above 4 and 2 respectively.  Beta blocker was avoided due to possible hx of pulmonary disease.  Tremors Patient noted symptoms of tremors this admission.  Patient will need outpatient referral to neurology.  This can be arranged through primary care.   Did the patient have an acute coronary syndrome (MI, NSTEMI, STEMI, etc) this admission?:  No                               Did the patient have a percutaneous coronary intervention (stent / angioplasty)?:  No.   _____________  Discharge Vitals Blood pressure 139/77, pulse 72, temperature 98.1 F (36.7 C), temperature source Oral, resp. rate 18, height 6' 10"  (2.083 m), weight (!) 172.9 kg, SpO2 100 %.  Filed Weights   08/11/19 0350 08/11/19 0806 08/12/19 0320  Weight: (!) 172.4 kg (!) 173.2 kg (!) 172.9 kg    Labs & Radiologic Studies    CBC Recent Labs    08/09/19 1355  HGB 11.9*  11.9*  HCT 35.0*  16.1*   Basic Metabolic Panel Recent Labs    08/10/19 0313 08/10/19 0313 08/11/19 0323 08/12/19 0305  NA 135   < > 135 139  K 4.0   < > 3.9 4.3  CL 95*   < > 98 99  CO2 29   < > 28 30  GLUCOSE 101*   < > 119* 132*  BUN 22   < > 19 17  CREATININE 1.12   < > 0.96 1.08  CALCIUM 9.0   < > 8.9 9.1  MG 2.1  --   --   --    < > = values in this interval not displayed.    High Sensitivity Troponin:   Recent Labs  Lab 08/06/19 1728  TROPONINIHS 5    BNP Invalid  input(s): POCBNP D-Dimer D-Dimer, Quant  Date/Time Value Ref Range Status  08/06/2019 05:28 PM <0.27 0.00 - 0.50 ug/mL-FEU Final    Comment:    (NOTE) At the manufacturer cut-off of 0.50 ug/mL FEU, this assay has been documented to exclude PE with a sensitivity and negative predictive value of 97 to 99%.  At this time, this assay has not been approved by the FDA to exclude DVT/VTE. Results should be correlated with clinical presentation. Performed at [facility], [place] [address] [place], [place] [address]     _____________  DG Chest 2 View Result Date: 08/06/2019 IMPRESSION: 1.  No radiographic evidence of acute cardiopulmonary disease. Electronically Signed   By: Vinnie Langton M.D.   On: 08/06/2019 07:34     Disposition   Pt is being discharged home today in good condition.  Follow-up Plans &  Appointments    Follow-up Information    Tobb, Kardie, DO Follow up in 1 week(s).   Specialty: Cardiology Why: The office will call to arrange follow up in 1 week.  Contact information: [place] [address] [phone]          Discharge Instructions    (HEART FAILURE PATIENTS) Call MD:  Anytime you have any of the following symptoms: 1) 3 pound weight gain in 24 hours or 5 pounds in 1 week 2) shortness of breath, with or without a dry hacking cough 3) swelling in the hands, feet or stomach 4) if you have to sleep on extra pillows at night in order to breathe.   Complete by: As directed    Diet - low sodium heart healthy   Complete by: As directed    Discharge instructions   Complete by: As directed    Weigh daily and record.  If weight increases 2-3 lbs or more in 1 day, take a dose of Metolazone 30 minutes prior to Furosemide and call our office.   Discharge wound care:   Complete by: As directed    Call for any swelling, bleeding, bruising.   Increase activity slowly   Complete by: As directed       Discharge Medications   Allergies as of  08/12/2019      Reactions   Penicillins    Did it involve swelling of the face/tongue/throat, SOB, or low BP? N/A Did it involve sudden or severe rash/hives, skin peeling, or any reaction on the inside of your mouth or nose? N/A Did you need to seek medical attention at a hospital or doctor's office?N/A When did it last happen?N/A If all above answers are "NO", may proceed with cephalosporin use.      Medication List    TAKE these medications   acetaminophen 325 MG tablet Commonly known as: TYLENOL Take 2 tablets (650 mg total) by mouth every 4 (four) hours as needed for fever or mild pain.   apixaban 5 MG Tabs tablet Commonly known as: Eliquis Take 1 tablet (5 mg total) by mouth 2 (two) times daily. Take 2 tablets (66m) twice daily for 7 days, then 1 tablet (589m twice daily What changed:   how much to take  how to take this  when to take this   Buprenorphine HCl-Naloxone HCl 8-2 MG Film Place 1 tablet under the tongue in the morning and at bedtime.   diphenhydrAMINE 25 MG tablet Commonly known as: BENADRYL Take 25 mg by mouth every 6 (six) hours as needed for itching or allergies.   furosemide 80 MG tablet Commonly known as: LASIX Take 1 tablet (80 mg total) by mouth 2 (two) times daily. What changed:   when to take this  Another medication with the same name was removed. Continue taking this medication, and follow the directions you see here.   gabapentin 800 MG tablet Commonly known as: NEURONTIN Take 1 tablet (800 mg total) by mouth 3 (three) times daily.   lisinopril 5 MG tablet Commonly known as: ZESTRIL Take 1 tablet (5 mg total) by mouth daily. What changed: how much to take   metolazone 5 MG tablet Commonly known as: ZAROXOLYN Take 1 tablet (5 mg total) by mouth daily as needed (weight gain of 2-3 lbs in 1 day or greater). What changed:   when to take this  reasons to take this   pantoprazole 40 MG tablet Commonly known as: PROTONIX Take 40  mg  by mouth 2 (two) times daily.   polyethylene glycol 17 g packet Commonly known as: MIRALAX / GLYCOLAX Take 17 g by mouth daily. What changed:   when to take this  reasons to take this   potassium chloride SA 20 MEQ tablet Commonly known as: KLOR-CON Take 2 tablets (40 mEq total) by mouth 2 (two) times daily. What changed: how much to take   pravastatin 20 MG tablet Commonly known as: PRAVACHOL Take 1 tablet (20 mg total) by mouth daily.   Ventolin HFA 108 (90 Base) MCG/ACT inhaler Generic drug: albuterol Inhale 1 puff into the lungs every 4 (four) hours as needed for shortness of breath.   zolpidem 10 MG tablet Commonly known as: AMBIEN Take 10 mg by mouth at bedtime as needed for sleep.            Discharge Care Instructions  (From admission, onward)         Start     Ordered   08/12/19 0000  Discharge wound care:    Comments: Call for any swelling, bleeding, bruising.   08/12/19 0958             Outstanding Labs/Studies   BMET, magnesium in 1 week  Duration of Discharge Encounter   Greater than 30 minutes including physician time.  Signed, Richardson Dopp, PA-C 08/12/2019, 10:04 AM

## 2019-08-12 NOTE — Progress Notes (Signed)
Progress Note  Patient Name: Charles Gray Date of Encounter: 08/12/2019  Primary Cardiologist: Berniece Salines, DO   Subjective   No CP; mild DOE  Inpatient Medications    Scheduled Meds: . apixaban  5 mg Oral BID  . buprenorphine-naloxone  1 tablet Sublingual BID  . furosemide  80 mg Oral BID  . gabapentin  800 mg Oral TID  . lisinopril  5 mg Oral Daily  . pantoprazole  40 mg Oral BID  . polyethylene glycol  17 g Oral Daily  . potassium chloride  40 mEq Oral BID  . pravastatin  20 mg Oral Daily   Continuous Infusions:  PRN Meds: acetaminophen, albuterol, nitroGLYCERIN, ondansetron (ZOFRAN) IV, zolpidem   Vital Signs    Vitals:   08/11/19 1451 08/11/19 1725 08/11/19 2037 08/12/19 0320  BP: 115/71 (!) 141/81 107/69 121/66  Pulse: 79 71 77 72  Resp: 20  20 18   Temp: 98.6 F (37 C) 98.3 F (36.8 C) 98.2 F (36.8 C) 98.1 F (36.7 C)  TempSrc: Oral Oral Oral Oral  SpO2: 98% 96% 99% 100%  Weight:    (!) 172.9 kg  Height:        Intake/Output Summary (Last 24 hours) at 08/12/2019 0825 Last data filed at 08/12/2019 0636 Gross per 24 hour  Intake 720 ml  Output --  Net 720 ml   Last 3 Weights 08/12/2019 08/11/2019 08/11/2019  Weight (lbs) 381 lb 2.8 oz 381 lb 14.4 oz 380 lb 1.2 oz  Weight (kg) 172.9 kg 173.229 kg 172.4 kg      Telemetry    Sinus- Personally Reviewed   Physical Exam   GEN: No acute distress. Obese   Neck: No JVD Cardiac: RRR, no murmurs, rubs, or gallops.  Respiratory: Clear to auscultation bilaterally. GI: Soft, nontender, non-distended  MS: trace edema Neuro:  Nonfocal  Psych: Normal affect   Labs    High Sensitivity Troponin:   Recent Labs  Lab 08/06/19 1728  TROPONINIHS 5      Chemistry Recent Labs  Lab 08/10/19 0313 08/11/19 0323 08/12/19 0305  NA 135 135 139  K 4.0 3.9 4.3  CL 95* 98 99  CO2 29 28 30   GLUCOSE 101* 119* 132*  BUN 22 19 17   CREATININE 1.12 0.96 1.08  CALCIUM 9.0 8.9 9.1  GFRNONAA >60 >60 >60  GFRAA  >60 >60 >60  ANIONGAP 11 9 10      Hematology Recent Labs  Lab 08/06/19 0554 08/09/19 1355  WBC 9.3  --   RBC 5.02  --   HGB 12.1* 11.9*  11.9*  HCT 39.6 35.0*  35.0*  MCV 78.9*  --   MCH 24.1*  --   MCHC 30.6  --   RDW 15.9*  --   PLT 166  --     BNP Recent Labs  Lab 08/06/19 0554  BNP 13.7     DDimer  Recent Labs  Lab 08/06/19 1728  DDIMER <0.27     Patient Profile     64 y.o. male with a history of chronic diastolic heart failure, obstructive sleep apnea, suspected cirrhosis of the liver, recent acute pulmonary embolism (February 2021), history of severe GI bleeding requiring transfusion (November 2020), hypertension, hyperlipidemia, morbid obesity, chronic pain due to lumbar spine spondyloarthrosis/herniated disks, chronic opiate use presenting to the emergency room due to edema and dyspnea refractory to home oral diuretics.  Echocardiogram shows normal LV function.  Right side not well visualized.  Assessment & Plan  1 acute on chronic diastolic congestive heart failure-I/O+1420. Wt 172.9 Kg.  Patient still has dyspnea on exertion but much improved compared to admission.  We will plan discharge today on Lasix 80 mg twice daily.  He will take metolazone 2.5 mg daily for weight gain of 2 to 3 pounds.  Check potassium and renal function in 1 week.  2 recent pulmonary embolus February 2021-continue apixaban 5 mg twice daily.  3 hypertension-blood pressure controlled.  Continue present medications.  4 obstructive sleep apnea-we will need follow-up as an outpatient.  Discharge today.  Check potassium and renal function in 1 week.  Follow-up Dr. Harriet Masson transition of care appointment 1 week.  Greater than 30 minutes PA and physician time. D2  For questions or updates, please contact [place] Please consult [email or web address] for contact info under        Signed, Kirk Ruths, MD  08/12/2019, 8:25 AM

## 2019-08-12 NOTE — Discharge Instructions (Signed)
Heart Failure, Self Care Heart failure is a serious condition. This document explains the things you need to do to take care of yourself after a heart failure diagnosis. You may be asked to change your diet, take certain medicines, and make other lifestyle changes in order to stay as healthy as possible. Your health care provider may also give you more specific instructions. If you have problems or questions, contact your health care provider. What are the risks? Having heart failure puts you at higher risk for certain problems. These problems can get worse if you do not take good care of yourself. Problems may include: Blood clotting problems. This may cause a stroke. Damage to the kidneys, liver, or lungs. Abnormal heart rhythms. Supplies needed: Scale for monitoring weight. Blood pressure monitor. Notebook. Medicines. How to care for yourself when you have heart failure Medicines Take over-the-counter and prescription medicines only as told by your health care provider. Medicines reduce the workload of your heart, slow the progression of heart failure, and improve symptoms. Take your medicines every day. Do not stop taking your medicine unless your health care provider tells you to do so. Do not skip any dose of medicine. Refill your prescriptions before you run out of medicine. Eating and drinking  Eat heart-healthy foods. Talk with a dietitian to make an eating plan that is right for you. Choose foods that contain no trans fat and are low in saturated fat and cholesterol. Healthy choices include fresh or frozen fruits and vegetables, fish, lean meats, legumes, fat-free or low-fat dairy products, and whole-grain or high-fiber foods. Limit salt (sodium) if told by your health care provider. Sodium restriction may reduce symptoms of heart failure. Ask a dietitian to recommend heart-healthy seasonings. Use healthy cooking methods instead of frying. Healthy methods include roasting, grilling,  broiling, baking, poaching, steaming, and stir-frying. Limit your fluid intake, if directed by your health care provider. Fluid restriction may reduce symptoms of heart failure. Alcohol use Do not drink alcohol if: Your health care provider tells you not to drink. Your heart was damaged by alcohol, or you have severe heart failure. You are pregnant, may be pregnant, or are planning to become pregnant. If you drink alcohol: Limit how much you use to: 0-1 drink a day for women. 0-2 drinks a day for men. Be aware of how much alcohol is in your drink. In the [place] one drink equals one 12 oz bottle of beer (355 mL), one 5 oz glass of wine (148 mL), or one 1 oz glass of hard liquor (44 mL). Lifestyle  Do not use any products that contain nicotine or tobacco, such as cigarettes, e-cigarettes, and chewing tobacco. If you need help quitting, ask your health care provider. Do not use nicotine gum or patches before talking to your health care provider. Do not use illegal drugs. Work with your health care provider to safely reach the right body weight. Do physical activity if told by your health care provider. Talk to your health care provider before you begin an exercise if: You are an older adult. You have severe heart failure. Learn to manage stress. If you need help to do this, ask your health care provider. Participate in or seek rehabilitation as needed to keep or improve your independence and quality of life. Plan rest periods when you get tired. Monitoring important information  Weigh yourself every day. This will help you to notice if too much fluid is building up in your body. Weigh yourself every morning  after you urinate and before you eat breakfast. Wear the same amount of clothing each time you weigh yourself. Record your daily weight. Provide your health care provider with your weight record. Monitor and record your pulse and blood pressure as told by your health care  provider. Dealing with extreme temperatures If the weather is extremely hot: Avoid vigorous physical activity. Use air conditioning or fans, or find a cooler location. Avoid caffeine and alcohol. Wear loose-fitting, lightweight, and light-colored clothing. If the weather is extremely cold: Avoid vigorous activity. Layer your clothes. Wear mittens or gloves, a hat, and a scarf when you go outside. Avoid alcohol. Follow these instructions at home: Stay up to date with vaccines. Pneumococcal and flu (influenza) vaccines are especially important in preventing infections of the airways. Keep all follow-up visits as told by your health care provider. This is important. Contact a health care provider if you: Have a rapid weight gain. Have increasing shortness of breath. Are unable to participate in your usual physical activities. Get tired easily. Cough more than normal, especially with physical activity. Lose your appetite or feel nauseous. Have any swelling or more swelling in areas such as your hands, feet, ankles, or abdomen. Are unable to sleep because it is hard to breathe. Feel like your heart is beating quickly (palpitations). Become dizzy or light-headed when you stand up. Get help right away if you: Have trouble breathing. Notice or your family notices a change in your awareness, such as having trouble staying awake or concentrating. Have pain or discomfort in your chest. Have an episode of fainting (syncope). These symptoms may represent a serious problem that is an emergency. Do not wait to see if the symptoms will go away. Get medical help right away. Call your local emergency services (911 in the [place] Do not drive yourself to the hospital. Summary Heart failure is a serious condition. To care for yourself, you may be asked to change your diet, take certain medicines, and make other lifestyle changes. Take your medicines every day. Do not stop taking them unless your health  care provider tells you to do so. Eat heart-healthy foods, such as fresh or frozen fruits and vegetables, fish, lean meats, legumes, fat-free or low-fat dairy products, and whole-grain or high-fiber foods. Ask your health care provider if you have any alcohol restrictions. You may have to stop drinking alcohol if you have severe heart failure. Contact your health care provider if you notice problems, such as rapid weight gain or a fast heartbeat. Get help right away if you faint, or have chest pain or trouble breathing. This information is not intended to replace advice given to you by your health care provider. Make sure you discuss any questions you have with your health care provider. Document Revised: 07/12/2018 Document Reviewed: 07/13/2018 Elsevier Patient Education  [place] every day  Take metolazone 2.5 mg 30 minutes prior to furosemide IF weight increases 2-3 pounds or more in 1 day, and call our office     Information on my medicine - ELIQUIS (apixaban)  Why was Eliquis prescribed for you? Eliquis was prescribed for you to reduce the risk of forming blood clots.  What do You need to know about Eliquis ? Take your Eliquis TWICE DAILY - one tablet in the morning and one tablet in the evening with or without food.  It would be best to take the doses about the same time each day.  If you have difficulty swallowing the tablet  whole please discuss with your pharmacist how to take the medication safely.  Take Eliquis exactly as prescribed by your doctor and DO NOT stop taking Eliquis without talking to the doctor who prescribed the medication.  Stopping may increase your risk of developing a new clot or stroke.  Refill your prescription before you run out.  After discharge, you should have regular check-up appointments with your healthcare provider that is prescribing your Eliquis.  In the future your dose may need to be changed if your kidney function or  weight changes by a significant amount or as you get older.  What do you do if you miss a dose? If you miss a dose, take it as soon as you remember on the same day and resume taking twice daily.  Do not take more than one dose of ELIQUIS at the same time.  Important Safety Information A possible side effect of Eliquis is bleeding. You should call your healthcare provider right away if you experience any of the following: ? Bleeding from an injury or your nose that does not stop. ? Unusual colored urine (red or dark brown) or unusual colored stools (red or black). ? Unusual bruising for unknown reasons. ? A serious fall or if you hit your head (even if there is no bleeding).  Some medicines may interact with Eliquis and might increase your risk of bleeding or clotting while on Eliquis. To help avoid this, consult your healthcare provider or pharmacist prior to using any new prescription or non-prescription medications, including herbals, vitamins, non-steroidal anti-inflammatory drugs (NSAIDs) and supplements.  This website has more information on Eliquis (apixaban): [email or web address].no.

## 2019-08-12 NOTE — Discharge Summary (Signed)
Discharge Summary    Patient ID: Charles Gray MRN: [id number]; DOB: 06/24/1955  Admit date: 08/06/2019 Discharge date: 08/12/2019  Primary Care Provider: Enid Skeens., MD  Primary Cardiologist: Charles Salines, DO  Primary Electrophysiologist:  None   Discharge Diagnoses    Principal Problem:   Acute on chronic right-sided heart failure [facility]) Active Problems:   AKI (acute kidney injury) ([place])   History of pulmonary embolism   Hypokalemia   Atrial tachycardia ([facility])   Occasional tremors   OSA (obstructive sleep apnea)    Diagnostic Studies/Procedures    Echocardiogram 08/07/2019 1. Suboptimal image quality for diagnostic purposes.  2. Left ventricular ejection fraction, by estimation, is 55 to 60%. The  left ventricle has normal function. Left ventricular endocardial border  not optimally defined to evaluate regional wall motion. Left ventricular  diastolic parameters are consistent  with Grade I diastolic dysfunction (impaired relaxation).  3. Right ventricular systolic function was not well visualized. The right  ventricular size is not well visualized.  4. The mitral valve is grossly normal. No evidence of mitral valve  regurgitation. No evidence of mitral stenosis.  5. The aortic valve was not well visualized. Aortic valve regurgitation  is not visualized. No aortic stenosis is present.   R Cardiac catheterization 08/09/2019 [facility] Procedural Findings: Hemodynamics (mmHg) RA mean 8 RV 21/8 PA 20/8, mean 14 PCWP mean 11  Oxygen saturations: PA 71% AO 100%  Cardiac Output (Fick) 8.5  Cardiac Index (Fick) 2.8  Conclusion 1. Normal filling pressures.  2. Normal PA pressure.  3. Normal cardiac output.   Pulmonary function test 08/11/2019 FVC 68% FEV1 68% FEV1/FVC 101% Mild obstructive disease _____________   History of Present Illness     Charles Gray is a 64 y.o. male with a history of chronic diastolic heart failure, obstructive sleep apnea,  suspected cirrhosis of the liver, recent acute pulmonary embolism (February 2021), history of severe GI bleeding requiring transfusion (November 2020), hypertension, hyperlipidemia, morbid obesity, chronic pain due to lumbar spine spondyloarthrosis/herniated disks, chronic opiate use presenting to the emergency room due to edema and dyspnea refractory to home oral diuretics (Furosemide and Metolazone).    Hospital Course     Consultants: none    Acute on chronic R sided CHF Patient was admitted and placed on IV diuresis.  Echocardiogram demonstrated EF 55-60 with grade 1 diastolic dysfunction.  He did have elevated creatinine with IV diuresis and his dose of furosemide was reduced.  He had minimal response to diuretic therapy and was set up for right heart catheterization.  This was performed 08/09/2019.  It demonstrated normal filling pressures.  Given his right heart cath findings, echocardiogram findings and course of admission, it was felt that he mainly has right-sided heart failure and very little, if any, evidence of diastolic dysfunction.  It is felt his R sided HF is likely related to pulmonary disease.  PFTs were ordered this admission.  The final report is pending but the preliminary result is mild obstructive disease.  Of note, weights were not felt to be accurate during his admission.  The patient was evaluated by Charles Gray this morning.  The patient is still short of breath with exertion, but much improved since admission.  He is felt to be stable for DC to home. Rx:  Furosemide 80 mg twice daily; Metolazone 2.5 mg once daily as needed for weight gain of 2-3 lbs  Acute Kidney Injury Patient's creatinine increased with IV diuresis.  Diuretic dose was adjusted.  Creatinine improved prior to discharge.  Creatinine at discharge 1.08.  History of pulmonary embolism Patient noted compliance with Apixaban.  D-dimer was negative.  Obstructive sleep apnea Suspected.  Recommendation was to  attempt a precise diagnosis of sleep apnea with sleep study and treatment with CPAP after hospital discharge.  This can be arranged at follow up.    Iron deficiency anemia Hemoglobin stable this admission.  Hypokalemia K was 2.9 on admission.  Potassium was replaced this admission.  Potassium was 4.3 at discharge.  Atrial tachycardia Patient noted symptoms of palpitations.  Arrhythmia noted in the emergency room upon presentation with rate up in the 140s.  He had brief runs of atrial tachycardia throughout his admission.  Potassium and magnesium were kept above 4 and 2 respectively.  Beta blocker was avoided due to possible hx of pulmonary disease.  Tremors Patient noted symptoms of tremors this admission.  Patient will need outpatient referral to neurology.  This can be arranged through primary care.   Did the patient have an acute coronary syndrome (MI, NSTEMI, STEMI, etc) this admission?:  No                               Did the patient have a percutaneous coronary intervention (stent / angioplasty)?:  No.   _____________  Discharge Vitals Blood pressure 139/77, pulse 72, temperature 98.1 F (36.7 C), temperature source Oral, resp. rate 18, height 6' 10"  (2.083 m), weight (!) 172.9 kg, SpO2 100 %.  Filed Weights   08/11/19 0350 08/11/19 0806 08/12/19 0320  Weight: (!) 172.4 kg (!) 173.2 kg (!) 172.9 kg    Labs & Radiologic Studies    CBC Recent Labs    08/09/19 1355  HGB 11.9*  11.9*  HCT 35.0*  43.1*   Basic Metabolic Panel Recent Labs    08/10/19 0313 08/10/19 0313 08/11/19 0323 08/12/19 0305  NA 135   < > 135 139  K 4.0   < > 3.9 4.3  CL 95*   < > 98 99  CO2 29   < > 28 30  GLUCOSE 101*   < > 119* 132*  BUN 22   < > 19 17  CREATININE 1.12   < > 0.96 1.08  CALCIUM 9.0   < > 8.9 9.1  MG 2.1  --   --   --    < > = values in this interval not displayed.    High Sensitivity Troponin:   Recent Labs  Lab 08/06/19 1728  TROPONINIHS 5    BNP Invalid  input(s): POCBNP D-Dimer D-Dimer, Quant  Date/Time Value Ref Range Status  08/06/2019 05:28 PM <0.27 0.00 - 0.50 ug/mL-FEU Final    Comment:    (NOTE) At the manufacturer cut-off of 0.50 ug/mL FEU, this assay has been documented to exclude PE with a sensitivity and negative predictive value of 97 to 99%.  At this time, this assay has not been approved by the FDA to exclude DVT/VTE. Results should be correlated with clinical presentation. Performed at [facility], [place] [address] [place], [place] [address]     _____________  DG Chest 2 View Result Date: 08/06/2019 IMPRESSION: 1.  No radiographic evidence of acute cardiopulmonary disease. Electronically Signed   By: Vinnie Langton M.D.   On: 08/06/2019 07:34     Disposition   Pt is being discharged home today in good condition.  Follow-up Plans &  Appointments    Follow-up Information    Tobb, Kardie, DO Follow up in 1 week(s).   Specialty: Cardiology Why: The office will call to arrange follow up in 1 week.  Contact information: [place] [address] [phone]          Discharge Instructions    (HEART FAILURE PATIENTS) Call MD:  Anytime you have any of the following symptoms: 1) 3 pound weight gain in 24 hours or 5 pounds in 1 week 2) shortness of breath, with or without a dry hacking cough 3) swelling in the hands, feet or stomach 4) if you have to sleep on extra pillows at night in order to breathe.   Complete by: As directed    Diet - low sodium heart healthy   Complete by: As directed    Discharge instructions   Complete by: As directed    Weigh daily and record.  If weight increases 2-3 lbs or more in 1 day, take a dose of Metolazone 30 minutes prior to Furosemide and call our office.   Discharge wound care:   Complete by: As directed    Call for any swelling, bleeding, bruising.   Increase activity slowly   Complete by: As directed       Discharge Medications   Allergies as of  08/12/2019      Reactions   Penicillins    Did it involve swelling of the face/tongue/throat, SOB, or low BP? N/A Did it involve sudden or severe rash/hives, skin peeling, or any reaction on the inside of your mouth or nose? N/A Did you need to seek medical attention at a hospital or doctor's office?N/A When did it last happen?N/A If all above answers are "NO", may proceed with cephalosporin use.      Medication List    TAKE these medications   acetaminophen 325 MG tablet Commonly known as: TYLENOL Take 2 tablets (650 mg total) by mouth every 4 (four) hours as needed for fever or mild pain.   apixaban 5 MG Tabs tablet Commonly known as: Eliquis Take 1 tablet (5 mg total) by mouth 2 (two) times daily. What changed:   how much to take  how to take this  when to take this  additional instructions   Buprenorphine HCl-Naloxone HCl 8-2 MG Film Place 1 tablet under the tongue in the morning and at bedtime.   diphenhydrAMINE 25 MG tablet Commonly known as: BENADRYL Take 25 mg by mouth every 6 (six) hours as needed for itching or allergies.   furosemide 80 MG tablet Commonly known as: LASIX Take 1 tablet (80 mg total) by mouth 2 (two) times daily. What changed:   when to take this  Another medication with the same name was removed. Continue taking this medication, and follow the directions you see here.   gabapentin 800 MG tablet Commonly known as: NEURONTIN Take 1 tablet (800 mg total) by mouth 3 (three) times daily.   lisinopril 5 MG tablet Commonly known as: ZESTRIL Take 1 tablet (5 mg total) by mouth daily. What changed: how much to take   metolazone 5 MG tablet Commonly known as: ZAROXOLYN Take 1 tablet (5 mg total) by mouth daily as needed (weight gain of 2-3 lbs in 1 day or greater). What changed:   when to take this  reasons to take this   pantoprazole 40 MG tablet Commonly known as: PROTONIX Take 40 mg by mouth 2 (two) times daily.   polyethylene  glycol  17 g packet Commonly known as: MIRALAX / GLYCOLAX Take 17 g by mouth daily. What changed:   when to take this  reasons to take this   potassium chloride SA 20 MEQ tablet Commonly known as: KLOR-CON Take 2 tablets (40 mEq total) by mouth 2 (two) times daily. What changed: how much to take   pravastatin 20 MG tablet Commonly known as: PRAVACHOL Take 1 tablet (20 mg total) by mouth daily.   Ventolin HFA 108 (90 Base) MCG/ACT inhaler Generic drug: albuterol Inhale 1 puff into the lungs every 4 (four) hours as needed for shortness of breath.   zolpidem 10 MG tablet Commonly known as: AMBIEN Take 10 mg by mouth at bedtime as needed for sleep.            Discharge Care Instructions  (From admission, onward)         Start     Ordered   08/12/19 0000  Discharge wound care:    Comments: Call for any swelling, bleeding, bruising.   08/12/19 0958             Outstanding Labs/Studies   BMET, magnesium in 1 week  Duration of Discharge Encounter   Greater than 30 minutes including physician time.  Signed, Richardson Dopp, PA-C 08/12/2019, 12:16 PM

## 2019-08-14 ENCOUNTER — Encounter (HOSPITAL_COMMUNITY): Payer: Medicare Other

## 2019-08-14 DIAGNOSIS — M5126 Other intervertebral disc displacement, lumbar region: Secondary | ICD-10-CM | POA: Diagnosis not present

## 2019-08-14 DIAGNOSIS — I82402 Acute embolism and thrombosis of unspecified deep veins of left lower extremity: Secondary | ICD-10-CM | POA: Diagnosis not present

## 2019-08-14 DIAGNOSIS — I5030 Unspecified diastolic (congestive) heart failure: Secondary | ICD-10-CM | POA: Diagnosis not present

## 2019-08-14 DIAGNOSIS — I110 Hypertensive heart disease with heart failure: Secondary | ICD-10-CM | POA: Diagnosis not present

## 2019-08-14 DIAGNOSIS — G894 Chronic pain syndrome: Secondary | ICD-10-CM | POA: Diagnosis not present

## 2019-08-14 DIAGNOSIS — I2699 Other pulmonary embolism without acute cor pulmonale: Secondary | ICD-10-CM | POA: Diagnosis not present

## 2019-08-15 DIAGNOSIS — I2699 Other pulmonary embolism without acute cor pulmonale: Secondary | ICD-10-CM | POA: Diagnosis not present

## 2019-08-15 DIAGNOSIS — I5030 Unspecified diastolic (congestive) heart failure: Secondary | ICD-10-CM | POA: Diagnosis not present

## 2019-08-15 DIAGNOSIS — G894 Chronic pain syndrome: Secondary | ICD-10-CM | POA: Diagnosis not present

## 2019-08-15 DIAGNOSIS — M5126 Other intervertebral disc displacement, lumbar region: Secondary | ICD-10-CM | POA: Diagnosis not present

## 2019-08-15 DIAGNOSIS — I82402 Acute embolism and thrombosis of unspecified deep veins of left lower extremity: Secondary | ICD-10-CM | POA: Diagnosis not present

## 2019-08-15 DIAGNOSIS — I110 Hypertensive heart disease with heart failure: Secondary | ICD-10-CM | POA: Diagnosis not present

## 2019-08-17 DIAGNOSIS — I5030 Unspecified diastolic (congestive) heart failure: Secondary | ICD-10-CM | POA: Diagnosis not present

## 2019-08-17 DIAGNOSIS — I82402 Acute embolism and thrombosis of unspecified deep veins of left lower extremity: Secondary | ICD-10-CM | POA: Diagnosis not present

## 2019-08-17 DIAGNOSIS — I2699 Other pulmonary embolism without acute cor pulmonale: Secondary | ICD-10-CM | POA: Diagnosis not present

## 2019-08-17 DIAGNOSIS — M5126 Other intervertebral disc displacement, lumbar region: Secondary | ICD-10-CM | POA: Diagnosis not present

## 2019-08-17 DIAGNOSIS — G894 Chronic pain syndrome: Secondary | ICD-10-CM | POA: Diagnosis not present

## 2019-08-17 DIAGNOSIS — I110 Hypertensive heart disease with heart failure: Secondary | ICD-10-CM | POA: Diagnosis not present

## 2019-08-18 ENCOUNTER — Ambulatory Visit (INDEPENDENT_AMBULATORY_CARE_PROVIDER_SITE_OTHER): Payer: Medicare Other | Admitting: Cardiology

## 2019-08-18 ENCOUNTER — Other Ambulatory Visit: Payer: Self-pay

## 2019-08-18 ENCOUNTER — Encounter: Payer: Self-pay | Admitting: Cardiology

## 2019-08-18 VITALS — BP 128/70 | HR 88 | Ht >= 80 in | Wt 369.0 lb

## 2019-08-18 DIAGNOSIS — M7989 Other specified soft tissue disorders: Secondary | ICD-10-CM | POA: Diagnosis not present

## 2019-08-18 DIAGNOSIS — I82402 Acute embolism and thrombosis of unspecified deep veins of left lower extremity: Secondary | ICD-10-CM | POA: Diagnosis not present

## 2019-08-18 DIAGNOSIS — I110 Hypertensive heart disease with heart failure: Secondary | ICD-10-CM | POA: Diagnosis not present

## 2019-08-18 DIAGNOSIS — Z79899 Other long term (current) drug therapy: Secondary | ICD-10-CM | POA: Diagnosis not present

## 2019-08-18 DIAGNOSIS — Z86711 Personal history of pulmonary embolism: Secondary | ICD-10-CM | POA: Diagnosis not present

## 2019-08-18 DIAGNOSIS — Z6841 Body Mass Index (BMI) 40.0 and over, adult: Secondary | ICD-10-CM | POA: Diagnosis not present

## 2019-08-18 DIAGNOSIS — G894 Chronic pain syndrome: Secondary | ICD-10-CM | POA: Diagnosis not present

## 2019-08-18 DIAGNOSIS — I2699 Other pulmonary embolism without acute cor pulmonale: Secondary | ICD-10-CM | POA: Diagnosis not present

## 2019-08-18 DIAGNOSIS — M5126 Other intervertebral disc displacement, lumbar region: Secondary | ICD-10-CM | POA: Diagnosis not present

## 2019-08-18 DIAGNOSIS — N184 Chronic kidney disease, stage 4 (severe): Secondary | ICD-10-CM

## 2019-08-18 DIAGNOSIS — I10 Essential (primary) hypertension: Secondary | ICD-10-CM

## 2019-08-18 DIAGNOSIS — I5030 Unspecified diastolic (congestive) heart failure: Secondary | ICD-10-CM | POA: Diagnosis not present

## 2019-08-18 DIAGNOSIS — I5032 Chronic diastolic (congestive) heart failure: Secondary | ICD-10-CM | POA: Diagnosis not present

## 2019-08-18 MED ORDER — LEVOFLOXACIN 250 MG PO TABS
250.0000 mg | ORAL_TABLET | Freq: Every day | ORAL | 0 refills | Status: DC
Start: 2019-08-18 — End: 2019-08-23

## 2019-08-18 NOTE — Progress Notes (Signed)
Cardiology Office Note:    Date:  08/18/2019   ID:  Charles Gray, DOB 1955/11/15, MRN [id number]  PCP:  Enid Skeens., MD  Cardiologist:  Berniece Salines, DO  Electrophysiologist:  None   Referring MD: Enid Skeens., MD   The patient is here for a follow up visit   History of Present Illness:    Charles Tarkentonis a 64 y.o.malewith a hx of hypertension, hyperlipidemia, morbid obesity, history of nonalcoholic fatty liver disease,herniated disc with chronic pain maintained on Suboxone followed by pain management, morbid obesity BMI 42.60.   Patient was initially seen by me on January 24, 2019 at which time he complained of shortness of breath and fatigue. He did tell me that he was experiencing orthopnea and PND. Therefore at the conclusion of the visit start patient on Lasix 40 mg every 12 hours with potassium 20 mEq daily. An echocardiogram was also ordered and patient was referred to GI for his alcoholic fatty liver disease.   I did see the patient, he appears to be losing weight on the diuretic therapy. At his initial visit he was 410 LBSand his visit onOctober 20, 2020 he was 405 pounds. Although the weight loss the patient still was not euvolemic therefore recommended the patient go to the ED at the [facility] to be given IV diuretics but he declined at that time.  I did see the patient again on February 27, 2019 at that time recommended hospitalization and he declined. I didincrease his lasix doseto 80 mg in the morning and 40 mg at night with metolazone.  He called on 03/02/2019 requestingan increase in his diuretics dose at that time I did tell the patient he needed to go to the ED- Thankfully he did go to the ED.  [facility] to [facility]. During his hospital course acute drop in blood pressure as well as hemoglobin with melanotic stool. He had  increasing leukocytosis and lactic acidosis but was afebrile. Placed on broad-spectrum antibiotics. CT of abdomen pelvis abdominal CTA without obvious source of bleeding. Hemoglobin did drop from 14-7.5. He was given 3 units packed red blood cells placed on Protonix and transferred to [facility] 03/04/2019 for further evaluation. Patient did require intubation for airway protection. Upper GI endoscopy 03/05/2019 per gastroenterology Dr. Pura Spice no varices seen. A large amount of fresh and clotted blood was found in the gastric fundus and in the proximal gastric body. Very mild portal hypertensive gastropathy. There was a chain of lesions on a fold in the distal gastric fundus with purple discoloration. Patient self extubated 03/06/2019. Bouts initially of confusion agitation cranial CT scan 03/07/2019 showed no acute findings. Hemoglobin stabilized 8.4.He was notedto havethrombocytopenia improved 111000-136000 felt to be reactive versus cirrhosis. He was discharged torehab. He was able to complete his rehab.  I did see the patient 05/30/2019 post hospitalization, at that visit I restarted his lasix 53m twice a day and stopped his metolazone.  Blood work was also done.  During our visit I encouraged the patient to decrease his salt intake as he was eating Ramen noodles every day.   I last saw that patient on 06/27/2019. Prior to that visit he has been started on Eliquis for pulmonary embolism.   [facility] he underwent a right heart catheterization.  He also had a pulmonary function test  Past  Medical History:  Diagnosis Date  . Acute respiratory failure with hypoxia ([place])   . Anemia of chronic disease   . Benign essential HTN   . Chronic heart failure with preserved EF 05/30/2019   Echo 07/2019: EF 55-60, GR 1 DD  . Chronic pain syndrome   . Cirrhosis of liver without ascites ([place])   . Debility  03/13/2019  . Drug induced constipation   . Dyspnea   . Fatty liver disease, nonalcoholic   . GIB (gastrointestinal bleeding) 03/04/2019  . Hyperlipidemia 01/24/2019  . Hypertension 01/24/2019  . Hypoalbuminemia due to protein-calorie malnutrition ([place])   . Hypokalemia   . Hyponatremia   . Hypotension due to drugs   . Insomnia 01/24/2019  . Lumbar disc herniation 01/24/2019  . Morbid obesity with BMI of 40.0-44.9, adult ([place]) 05/30/2019  . Normocytic anemia 06/08/2019  . Peripheral edema   . Pulmonary emboli ([place]) 06/08/2019  . Pulmonary embolism ([place]) 06/08/2019  . Shock ([place])   . Thrombocytopenia ([place])     Past Surgical History:  Procedure Laterality Date  . ESOPHAGOGASTRODUODENOSCOPY (EGD) WITH PROPOFOL N/A 03/05/2019   Procedure: ESOPHAGOGASTRODUODENOSCOPY (EGD) WITH PROPOFOL;  Surgeon: Doran Stabler, MD;  Location: [place];  Service: Gastroenterology;  Laterality: N/A;  Large Blood Clot Removed  . ESOPHAGOGASTRODUODENOSCOPY (EGD) WITH PROPOFOL N/A 03/06/2019   Procedure: ESOPHAGOGASTRODUODENOSCOPY (EGD) WITH PROPOFOL;  Surgeon: Thornton Park, MD;  Location: [place];  Service: Gastroenterology;  Laterality: N/A;  . HEMORROIDECTOMY    . RIGHT HEART CATH N/A 08/09/2019   Procedure: RIGHT HEART CATH;  Surgeon: Larey Dresser, MD;  Location: [place] [facility];  Service: Cardiovascular;  Laterality: N/A;    Current Medications: Current Meds  Medication Sig  . acetaminophen (TYLENOL) 325 MG tablet Take 2 tablets (650 mg total) by mouth every 4 (four) hours as needed for fever or mild pain.  Marland Kitchen apixaban (ELIQUIS) 5 MG TABS tablet Take 1 tablet (5 mg total) by mouth 2 (two) times daily.  . Buprenorphine HCl-Naloxone HCl 8-2 MG FILM Place 1 tablet under the tongue in the morning and at bedtime.   . diphenhydrAMINE (BENADRYL) 25 MG tablet Take 25 mg by mouth every 6 (six) hours as needed for itching or allergies.  . furosemide (LASIX) 80 MG tablet Take 1 tablet (80  mg total) by mouth 2 (two) times daily.  Marland Kitchen gabapentin (NEURONTIN) 800 MG tablet Take 1 tablet (800 mg total) by mouth 3 (three) times daily.  Marland Kitchen lisinopril (ZESTRIL) 5 MG tablet Take 1 tablet (5 mg total) by mouth daily.  . metolazone (ZAROXOLYN) 5 MG tablet Take 1 tablet (5 mg total) by mouth daily as needed (weight gain of 2-3 lbs in 1 day or greater).  . pantoprazole (PROTONIX) 40 MG tablet Take 40 mg by mouth 2 (two) times daily.  . polyethylene glycol (MIRALAX / GLYCOLAX) 17 g packet Take 17 g by mouth daily. (Patient taking differently: Take 17 g by mouth daily as needed for mild constipation. )  . potassium chloride SA (KLOR-CON) 20 MEQ tablet Take 2 tablets (40 mEq total) by mouth 2 (two) times daily.  . pravastatin (PRAVACHOL) 20 MG tablet Take 1 tablet (20 mg total) by mouth daily.  . VENTOLIN HFA 108 (90 Base) MCG/ACT inhaler Inhale 1 puff into the lungs every 4 (four) hours as needed for shortness of breath.  . zolpidem (AMBIEN) 10 MG tablet Take 10 mg by mouth at bedtime as needed for sleep.  Allergies:   Penicillins   Social History   Socioeconomic History  . Marital status: Unknown    Spouse name: Not on file  . Number of children: Not on file  . Years of education: Not on file  . Highest education level: Not on file  Occupational History  . Not on file  Tobacco Use  . Smoking status: Never Smoker  . Smokeless tobacco: Never Used  Substance and Sexual Activity  . Alcohol use: Never  . Drug use: Not Currently    Types: Marijuana    Comment: Smoked for 30 years  . Sexual activity: Not on file  Other Topics Concern  . Not on file  Social History Narrative  . Not on file   Social Determinants of Health   Financial Resource Strain:   . Difficulty of Paying Living Expenses:   Food Insecurity:   . Worried About Charity fundraiser in the Last Year:   . Arboriculturist in the Last Year:   Transportation Needs:   . Film/video editor (Medical):   Marland Kitchen Lack of  Transportation (Non-Medical):   Physical Activity:   . Days of Exercise per Week:   . Minutes of Exercise per Session:   Stress:   . Feeling of Stress :   Social Connections:   . Frequency of Communication with Friends and Family:   . Frequency of Social Gatherings with Friends and Family:   . Attends Religious Services:   . Active Member of Clubs or Organizations:   . Attends Archivist Meetings:   Marland Kitchen Marital Status:      Family History: The patient's family history includes CAD in his maternal grandfather and maternal grandmother; Hyperlipidemia in his mother; Hypertension in his mother; Stroke in his mother.  ROS:   Review of Systems  Constitution: Negative for decreased appetite, fever and weight gain.  HENT: Negative for congestion, ear discharge, hoarse voice and sore throat.   Eyes: Negative for discharge, redness, vision loss in right eye and visual halos.  Cardiovascular: Negative for chest pain, dyspnea on exertion, leg swelling, orthopnea and palpitations.  Respiratory: Negative for cough, hemoptysis, shortness of breath and snoring.   Endocrine: Negative for heat intolerance and polyphagia.  Hematologic/Lymphatic: Negative for bleeding problem. Does not bruise/bleed easily.  Skin: Negative for flushing, nail changes, rash and suspicious lesions.  Musculoskeletal: Negative for arthritis, joint pain, muscle cramps, myalgias, neck pain and stiffness.  Gastrointestinal: Negative for abdominal pain, bowel incontinence, diarrhea and excessive appetite.  Genitourinary: Negative for decreased libido, genital sores and incomplete emptying.  Neurological: Negative for brief paralysis, focal weakness, headaches and loss of balance.  Psychiatric/Behavioral: Negative for altered mental status, depression and suicidal ideas.  Allergic/Immunologic: Negative for HIV exposure and persistent infections.    EKGs/Labs/Other Studies Reviewed:    The following studies were  reviewed today:   EKG:  None today   CTA chest 06/26/19 FINDINGS: Cardiovascular: No pulmonary embolism in the pulmonary trunk, right pulmonary artery or left pulmonary artery. Central contrast filling defects in the bilateral upper lobar pulmonary arteries and in the bibasilar segmental pulmonary arteries. The right ventricle measures 4.4 cm. The left ventricle measures 5.6 cm. Right ventricular to left ventricular ratio is 0.78; no right heart strain. Normal caliber of the thoracic aorta without calcified atherosclerosis.  Mediastinum/Nodes: Nonspecific small lymph nodes measuring up to 9 mm short axis diameter at the gastroesophageal junction. An abnormal number of small mediastinal lymph nodes and a borderline enlarged  12 x 16 mm noncalcified right paratracheal lymph node with normal fatty hilum.  Lungs/Pleura: No pleural fluid. No pneumonia or pulmonary edema. No pneumothorax. A small portion of the bilateral posterior costophrenic angles are incompletely imaged / evaluated. No suspicious pulmonary nodule in the imaged portions of the lungs.  Upper Abdomen: No acute abnormality.  Musculoskeletal: Diffuse bone demineralization and moderate degenerative changes. Healed right rib fracture deformities.  Review of the MIP images confirms the above findings.  I discussed critical results by telephone at the time of interpretation on 06/25/2019 at 18:33 pm with provider Sherwood Gambler , who verbally acknowledged these results.  IMPRESSION: Peripheral bilateral pulmonary emboli without right heart strain.  A nonspecific borderline enlarged right paratracheal noncalcified lymph node. Abnormal number of small mediastinal lymph nodes, possibly reactive.  Incomplete imaging of the bilateral posterior costophrenic angles without obvious pleural effusion or acute pulmonary disease.   TTE IMPRESSIONS 08/07/19 1. Suboptimal image quality for diagnostic purposes.  2.  Left ventricular ejection fraction, by estimation, is 55 to 60%. The  left ventricle has normal function. Left ventricular endocardial border  not optimally defined to evaluate regional wall motion. Left ventricular  diastolic parameters are consistent  with Grade I diastolic dysfunction (impaired relaxation).  3. Right ventricular systolic function was not well visualized. The right  ventricular size is not well visualized.  4. The mitral valve is grossly normal. No evidence of mitral valve  regurgitation. No evidence of mitral stenosis.  5. The aortic valve was not well visualized. Aortic valve regurgitation  is not visualized. No aortic stenosis is present.   Right heart Cath:  1. Normal filling pressures.  2. Normal PA pressure.  3. Normal cardiac output.   Recent Labs: 02/27/2019: NT-Pro BNP 37 03/05/2019: TSH 0.809 06/26/2019: ALT 25 08/06/2019: B Natriuretic Peptide 13.7; Platelets 166 08/09/2019: Hemoglobin 11.9; Hemoglobin 11.9 08/10/2019: Magnesium 2.1 08/12/2019: BUN 17; Creatinine, Ser 1.08; Potassium 4.3; Sodium 139  Recent Lipid Panel    Component Value Date/Time   CHOL 128 01/24/2019 1121   TRIG 161 (H) 03/07/2019 0721   HDL 42 01/24/2019 1121   CHOLHDL 3.0 01/24/2019 1121   LDLCALC 64 01/24/2019 1121    Physical Exam:    VS:  BP 128/70 (BP Location: Left Arm, Patient Position: Sitting, Cuff Size: Large)   Pulse 88   Ht 6' 10"  (2.083 m)   Wt (!) 369 lb (167.4 kg)   SpO2 96%   BMI 38.58 kg/m     Wt Readings from Last 3 Encounters:  08/18/19 (!) 369 lb (167.4 kg)  08/12/19 (!) 381 lb 2.8 oz (172.9 kg)  06/27/19 (!) 383 lb 9.6 oz (174 kg)     GEN: Well nourished, well developed in no acute distress HEENT: Normal NECK: No JVD; No carotid bruits LYMPHATICS: No lymphadenopathy CARDIAC: S1S2 noted,RRR, no murmurs, rubs, gallops RESPIRATORY:  Clear to auscultation without rales, wheezing or rhonchi  ABDOMEN: Soft, non-tender, non-distended, +bowel sounds,  no guarding. EXTREMITIES: right leg edema, with warmth and significant redness. No cyanosis, no clubbing MUSCULOSKELETAL:  No deformity  SKIN: Warm and dry NEUROLOGIC:  Alert and oriented x 3, non-focal PSYCHIATRIC:  Normal affect, good insight  ASSESSMENT:    1. Essential hypertension   2. Medication management   3. Right leg swelling   4. Chronic heart failure with preserved ejection fraction (HCC)   5. Morbid obesity with BMI of 40.0-44.9, adult ([place])   6. History of pulmonary embolism   7. CKD (chronic kidney disease)  stage 4, GFR 15-29 ml/min (HCC)    PLAN:     In terms of her congestive heart failure the patient appears to be euvolemic.  We will continue him on his Lasix 80 mg daily along with his as needed metolazone.  He understands that if he gains 2 to 3 pounds within 3 days to go ahead and take a dose of his metolazone.  Blood work will be done today to assess kidney function and electrolytes.  369 will be considered his dry weight.  On the other hand compared to his left leg, his right leg swelling which is worse than his left leg with warmth and redness concerns me.  My suspicion is cellulitis.  Did speak with the patient PCP today and we discussed and both decided decided a Levaquin will be a better choice so Levaquin 250 x 3 days will be given to the patient and he will follow his PCP next week.  In addition we will get a ultrasound duplex to rule out worsening DVT given his history of PE on Eliquis.  I reviewed his PFTs which show mild obstruction I educated the patient about this.  This testing result also has been sent to his PCP office.   The patient is in agreement with the above plan. The patient left the office in stable condition.  The patient will follow up in 1 month or sooner if needed.   Medication Adjustments/Labs and Tests Ordered: Current medicines are reviewed at length with the patient today.  Concerns regarding medicines are outlined above.  Orders  Placed This Encounter  Procedures  . Basic metabolic panel  . Magnesium  . VAS [place] LOWER EXTREMITY ARTERIAL DUPLEX   Meds ordered this encounter  Medications  . levofloxacin (LEVAQUIN) 250 MG tablet    Sig: Take 1 tablet (250 mg total) by mouth daily. Take 1 tablet by mouth daily for 3 days    Dispense:  3 tablet    Refill:  0    Patient Instructions  Medication Instructions:  Start Levofloxacin (Levaqui) 250 mg daily for 3 days   *If you need a refill on your cardiac medications before your next appointment, please call your pharmacy*   Lab Work: [place]- Today   If you have labs (blood work) drawn today and your tests are completely normal, you will receive your results only by: Marland Kitchen MyChart Message (if you have MyChart) OR . A paper copy in the mail If you have any lab test that is abnormal or we need to change your treatment, we will call you to review the results.   Testing/Procedures: Your physician has requested that you have a lower or upper extremity arterial duplex. This test is an ultrasound of the arteries in the legs or arms. It looks at arterial blood flow in the legs and arms. Allow one hour for Lower and Upper Arterial scans. There are no restrictions or special instructions    Follow-Up: At [facility], you and your health needs are our priority.  As part of our continuing mission to provide you with exceptional heart care, we have created designated Provider Care Teams.  These Care Teams include your primary Cardiologist (physician) and Advanced Practice Providers (APPs -  Physician Assistants and Nurse Practitioners) who all work together to provide you with the care you need, when you need it.  We recommend signing up for the patient portal called "MyChart".  Sign up information is provided on this After Visit Summary.  MyChart is used to connect with patients for Virtual Visits (Telemedicine).  Patients are able to view lab/test results, encounter notes,  upcoming appointments, etc.  Non-urgent messages can be sent to your provider as well.   To learn more about what you can do with MyChart, go to [email or web address].    Your next appointment:   1 month(s)  The format for your next appointment:   In Person  Provider:   Berniece Salines, DO   Other Instructions None      Adopting a Healthy Lifestyle.  Know what a healthy weight is for you (roughly BMI <25) and aim to maintain this   Aim for 7+ servings of fruits and vegetables daily   65-80+ fluid ounces of water or unsweet tea for healthy kidneys   Limit to max 1 drink of alcohol per day; avoid smoking/tobacco   Limit animal fats in diet for cholesterol and heart health - choose grass fed whenever available   Avoid highly processed foods, and foods high in saturated/trans fats   Aim for low stress - take time to unwind and care for your mental health   Aim for 150 min of moderate intensity exercise weekly for heart health, and weights twice weekly for bone health   Aim for 7-9 hours of sleep daily   When it comes to diets, agreement about the perfect plan isnt easy to find, even among the experts. Experts at the [employer] developed an idea known as the Healthy Eating Plate. Just imagine a plate divided into logical, healthy portions.   The emphasis is on diet quality:   Load up on vegetables and fruits - one-half of your plate: Aim for color and variety, and remember that potatoes dont count.   Go for whole grains - one-quarter of your plate: Whole wheat, barley, wheat berries, quinoa, oats, brown rice, and foods made with them. If you want pasta, go with whole wheat pasta.   Protein power - one-quarter of your plate: Fish, chicken, beans, and nuts are all healthy, versatile protein sources. Limit red meat.   The diet, however, does go beyond the plate, offering a few other suggestions.   Use healthy plant oils, such as olive, canola, soy,  corn, sunflower and peanut. Check the labels, and avoid partially hydrogenated oil, which have unhealthy trans fats.   If youre thirsty, drink water. Coffee and tea are good in moderation, but skip sugary drinks and limit milk and dairy products to one or two daily servings.   The type of carbohydrate in the diet is more important than the amount. Some sources of carbohydrates, such as vegetables, fruits, whole grains, and beans-are healthier than others.   Finally, stay active  Signed, Berniece Salines, DO  08/18/2019 1:11 PM    [place]

## 2019-08-18 NOTE — Patient Instructions (Addendum)
Medication Instructions:  Start Levofloxacin (Levaqui) 250 mg daily for 3 days   *If you need a refill on your cardiac medications before your next appointment, please call your pharmacy*   Lab Work: [place]- Today   If you have labs (blood work) drawn today and your tests are completely normal, you will receive your results only by: Marland Kitchen MyChart Message (if you have MyChart) OR . A paper copy in the mail If you have any lab test that is abnormal or we need to change your treatment, we will call you to review the results.   Testing/Procedures: Your physician has requested that you have a lower or upper extremity arterial duplex. This test is an ultrasound of the arteries in the legs or arms. It looks at arterial blood flow in the legs and arms. Allow one hour for Lower and Upper Arterial scans. There are no restrictions or special instructions    Follow-Up: At [facility], you and your health needs are our priority.  As part of our continuing mission to provide you with exceptional heart care, we have created designated Provider Care Teams.  These Care Teams include your primary Cardiologist (physician) and Advanced Practice Providers (APPs -  Physician Assistants and Nurse Practitioners) who all work together to provide you with the care you need, when you need it.  We recommend signing up for the patient portal called "MyChart".  Sign up information is provided on this After Visit Summary.  MyChart is used to connect with patients for Virtual Visits (Telemedicine).  Patients are able to view lab/test results, encounter notes, upcoming appointments, etc.  Non-urgent messages can be sent to your provider as well.   To learn more about what you can do with MyChart, go to [email or web address].    Your next appointment:   1 month(s)  The format for your next appointment:   In Person  Provider:   Berniece Salines, DO   Other Instructions None

## 2019-08-19 LAB — BASIC METABOLIC PANEL
BUN/Creatinine Ratio: 20 (ref 10–24)
BUN: 24 mg/dL (ref 8–27)
CO2: 28 mmol/L (ref 20–29)
Calcium: 9.4 mg/dL (ref 8.6–10.2)
Chloride: 87 mmol/L — ABNORMAL LOW (ref 96–106)
Creatinine, Ser: 1.18 mg/dL (ref 0.76–1.27)
GFR calc Af Amer: 75 mL/min/{1.73_m2} (ref >59)
GFR calc non Af Amer: 65 mL/min/{1.73_m2} (ref >59)
Glucose: 107 mg/dL — ABNORMAL HIGH (ref 65–99)
Potassium: 2.9 mmol/L — ABNORMAL LOW (ref 3.5–5.2)
Sodium: 135 mmol/L (ref 134–144)

## 2019-08-19 LAB — MAGNESIUM: Magnesium: 2 mg/dL (ref 1.6–2.3)

## 2019-08-21 ENCOUNTER — Telehealth: Payer: Self-pay | Admitting: Cardiology

## 2019-08-21 ENCOUNTER — Telehealth: Payer: Self-pay

## 2019-08-21 DIAGNOSIS — Z9181 History of falling: Secondary | ICD-10-CM | POA: Diagnosis not present

## 2019-08-21 DIAGNOSIS — G4700 Insomnia, unspecified: Secondary | ICD-10-CM | POA: Diagnosis not present

## 2019-08-21 DIAGNOSIS — I471 Supraventricular tachycardia: Secondary | ICD-10-CM | POA: Diagnosis not present

## 2019-08-21 DIAGNOSIS — G894 Chronic pain syndrome: Secondary | ICD-10-CM | POA: Diagnosis not present

## 2019-08-21 DIAGNOSIS — Z86711 Personal history of pulmonary embolism: Secondary | ICD-10-CM | POA: Diagnosis not present

## 2019-08-21 DIAGNOSIS — I82402 Acute embolism and thrombosis of unspecified deep veins of left lower extremity: Secondary | ICD-10-CM | POA: Diagnosis not present

## 2019-08-21 DIAGNOSIS — G4733 Obstructive sleep apnea (adult) (pediatric): Secondary | ICD-10-CM | POA: Diagnosis not present

## 2019-08-21 DIAGNOSIS — M5126 Other intervertebral disc displacement, lumbar region: Secondary | ICD-10-CM | POA: Diagnosis not present

## 2019-08-21 DIAGNOSIS — E785 Hyperlipidemia, unspecified: Secondary | ICD-10-CM | POA: Diagnosis not present

## 2019-08-21 DIAGNOSIS — D509 Iron deficiency anemia, unspecified: Secondary | ICD-10-CM | POA: Diagnosis not present

## 2019-08-21 DIAGNOSIS — I10 Essential (primary) hypertension: Secondary | ICD-10-CM

## 2019-08-21 DIAGNOSIS — J45909 Unspecified asthma, uncomplicated: Secondary | ICD-10-CM | POA: Diagnosis not present

## 2019-08-21 DIAGNOSIS — I5032 Chronic diastolic (congestive) heart failure: Secondary | ICD-10-CM | POA: Diagnosis not present

## 2019-08-21 DIAGNOSIS — D696 Thrombocytopenia, unspecified: Secondary | ICD-10-CM | POA: Diagnosis not present

## 2019-08-21 DIAGNOSIS — K219 Gastro-esophageal reflux disease without esophagitis: Secondary | ICD-10-CM | POA: Diagnosis not present

## 2019-08-21 DIAGNOSIS — Z6841 Body Mass Index (BMI) 40.0 and over, adult: Secondary | ICD-10-CM | POA: Diagnosis not present

## 2019-08-21 DIAGNOSIS — K7581 Nonalcoholic steatohepatitis (NASH): Secondary | ICD-10-CM | POA: Diagnosis not present

## 2019-08-21 DIAGNOSIS — I50813 Acute on chronic right heart failure: Secondary | ICD-10-CM | POA: Diagnosis not present

## 2019-08-21 DIAGNOSIS — I110 Hypertensive heart disease with heart failure: Secondary | ICD-10-CM | POA: Diagnosis not present

## 2019-08-21 DIAGNOSIS — Z7901 Long term (current) use of anticoagulants: Secondary | ICD-10-CM | POA: Diagnosis not present

## 2019-08-21 NOTE — Telephone Encounter (Signed)
Need order for pt to have vascular study at [facility] cancelling appt here and Dr Harriet Masson wanted it done this week

## 2019-08-21 NOTE — Telephone Encounter (Signed)
Dr. Harriet Masson said that is fine.

## 2019-08-21 NOTE — Telephone Encounter (Signed)
Spoke with Dr Harriet Masson in regards to this patients issue. She advised that the patient go ahead and take his metolazone that he has prescribed as needed for weight gain. The patient verbalizes understanding and states that he will do this.    Encouraged patient to call back with any questions or concerns.

## 2019-08-21 NOTE — Telephone Encounter (Signed)
Pt c/o swelling: STAT is pt has developed SOB within 24 hours  1) How much weight have you gained and in what time span? 12lbs over the weekend  2) If swelling, where is the swelling located? Located in feet and ankles  3) Are you currently taking a fluid pill? yes  4) Are you currently SOB? SOB, but he normally is.   5) Do you have a log of your daily weights (if so, list)?   6) Have you gained 3 pounds in a day or 5 pounds in a week? Yes   7) Have you traveled recently? no

## 2019-08-21 NOTE — Telephone Encounter (Signed)
We can do it Friday May 28-just didn't know if that was too far out

## 2019-08-21 NOTE — Telephone Encounter (Signed)
Spoke with patient regarding recommendations. He states that he has been taking his potassium as Dr. Harriet Masson told him to and will do his best to come in on Friday for his lab work.   Patient verbalizes understanding and is agreeable to plan of care. Advised patient to call back with any issues or concerns.

## 2019-08-21 NOTE — Telephone Encounter (Signed)
-----   Message from Berniece Salines, DO sent at 08/19/2019  2:17 PM EDT ----- I called the patient over the weekend - I asked him to increase his potassium from twice daily to 3 times a day for the next 2 days.  Therefore he will be 40 mg every 6-8 hours.  I asked him to come on Wednesday or Thursday for blood work but he is unable to do that as his son would not be asked to take him therefore he will be able to make it on Friday to get his blood work.  Please check with the patient during the week to make sure that he had taken his potassium as we discussed and please put in blood work BMP and magnesium for Friday.

## 2019-08-21 NOTE — Telephone Encounter (Signed)
I spoke to Dr. Harriet Masson in regards to this. She states that the patient only has transportation on Friday's but she would like for it to be done here in our office. Can you get him scheduled for a Friday?

## 2019-08-22 DIAGNOSIS — G894 Chronic pain syndrome: Secondary | ICD-10-CM | POA: Diagnosis not present

## 2019-08-22 DIAGNOSIS — I50813 Acute on chronic right heart failure: Secondary | ICD-10-CM | POA: Diagnosis not present

## 2019-08-22 DIAGNOSIS — I110 Hypertensive heart disease with heart failure: Secondary | ICD-10-CM | POA: Diagnosis not present

## 2019-08-22 DIAGNOSIS — M5126 Other intervertebral disc displacement, lumbar region: Secondary | ICD-10-CM | POA: Diagnosis not present

## 2019-08-22 DIAGNOSIS — I5032 Chronic diastolic (congestive) heart failure: Secondary | ICD-10-CM | POA: Diagnosis not present

## 2019-08-22 DIAGNOSIS — I471 Supraventricular tachycardia: Secondary | ICD-10-CM | POA: Diagnosis not present

## 2019-08-23 ENCOUNTER — Inpatient Hospital Stay (HOSPITAL_COMMUNITY)
Admission: EM | Admit: 2019-08-23 | Discharge: 2019-08-30 | DRG: 292 | Disposition: A | Discharge status: Home Health | Payer: Medicare Other | Attending: Internal Medicine | Admitting: Internal Medicine

## 2019-08-23 ENCOUNTER — Other Ambulatory Visit: Payer: Self-pay

## 2019-08-23 ENCOUNTER — Emergency Department (HOSPITAL_COMMUNITY): Payer: Medicare Other

## 2019-08-23 ENCOUNTER — Emergency Department (HOSPITAL_BASED_OUTPATIENT_CLINIC_OR_DEPARTMENT_OTHER)
Admit: 2019-08-23 | Discharge: 2019-08-23 | Disposition: A | Discharge status: Home / Self Care | Payer: Medicare Other | Attending: Emergency Medicine | Admitting: Emergency Medicine

## 2019-08-23 ENCOUNTER — Telehealth: Payer: Self-pay | Admitting: Cardiology

## 2019-08-23 ENCOUNTER — Encounter (HOSPITAL_COMMUNITY): Payer: Self-pay | Admitting: Internal Medicine

## 2019-08-23 DIAGNOSIS — Z86711 Personal history of pulmonary embolism: Secondary | ICD-10-CM | POA: Diagnosis not present

## 2019-08-23 DIAGNOSIS — E876 Hypokalemia: Secondary | ICD-10-CM | POA: Diagnosis not present

## 2019-08-23 DIAGNOSIS — R0602 Shortness of breath: Secondary | ICD-10-CM

## 2019-08-23 DIAGNOSIS — E872 Acidosis, unspecified: Secondary | ICD-10-CM | POA: Diagnosis present

## 2019-08-23 DIAGNOSIS — E782 Mixed hyperlipidemia: Secondary | ICD-10-CM | POA: Diagnosis present

## 2019-08-23 DIAGNOSIS — K7460 Unspecified cirrhosis of liver: Secondary | ICD-10-CM | POA: Diagnosis present

## 2019-08-23 DIAGNOSIS — R079 Chest pain, unspecified: Secondary | ICD-10-CM | POA: Diagnosis not present

## 2019-08-23 DIAGNOSIS — M7989 Other specified soft tissue disorders: Secondary | ICD-10-CM | POA: Diagnosis present

## 2019-08-23 DIAGNOSIS — R0789 Other chest pain: Secondary | ICD-10-CM | POA: Diagnosis present

## 2019-08-23 DIAGNOSIS — R42 Dizziness and giddiness: Secondary | ICD-10-CM | POA: Diagnosis not present

## 2019-08-23 DIAGNOSIS — R238 Other skin changes: Secondary | ICD-10-CM | POA: Diagnosis not present

## 2019-08-23 DIAGNOSIS — E871 Hypo-osmolality and hyponatremia: Secondary | ICD-10-CM | POA: Diagnosis present

## 2019-08-23 DIAGNOSIS — I5032 Chronic diastolic (congestive) heart failure: Secondary | ICD-10-CM | POA: Diagnosis present

## 2019-08-23 DIAGNOSIS — I5033 Acute on chronic diastolic (congestive) heart failure: Secondary | ICD-10-CM | POA: Diagnosis present

## 2019-08-23 DIAGNOSIS — G894 Chronic pain syndrome: Secondary | ICD-10-CM | POA: Diagnosis present

## 2019-08-23 DIAGNOSIS — R609 Edema, unspecified: Secondary | ICD-10-CM

## 2019-08-23 DIAGNOSIS — Z881 Allergy status to other antibiotic agents status: Secondary | ICD-10-CM

## 2019-08-23 DIAGNOSIS — I110 Hypertensive heart disease with heart failure: Secondary | ICD-10-CM | POA: Diagnosis not present

## 2019-08-23 DIAGNOSIS — R0601 Orthopnea: Secondary | ICD-10-CM | POA: Diagnosis not present

## 2019-08-23 DIAGNOSIS — I5023 Acute on chronic systolic (congestive) heart failure: Secondary | ICD-10-CM | POA: Diagnosis not present

## 2019-08-23 DIAGNOSIS — Z6839 Body mass index (BMI) 39.0-39.9, adult: Secondary | ICD-10-CM

## 2019-08-23 DIAGNOSIS — Z7901 Long term (current) use of anticoagulants: Secondary | ICD-10-CM

## 2019-08-23 DIAGNOSIS — I5043 Acute on chronic combined systolic (congestive) and diastolic (congestive) heart failure: Secondary | ICD-10-CM

## 2019-08-23 DIAGNOSIS — Z86718 Personal history of other venous thrombosis and embolism: Secondary | ICD-10-CM

## 2019-08-23 DIAGNOSIS — I471 Supraventricular tachycardia: Secondary | ICD-10-CM | POA: Diagnosis not present

## 2019-08-23 DIAGNOSIS — Z20822 Contact with and (suspected) exposure to covid-19: Secondary | ICD-10-CM | POA: Diagnosis not present

## 2019-08-23 DIAGNOSIS — R000 Tachycardia, unspecified: Secondary | ICD-10-CM | POA: Diagnosis present

## 2019-08-23 DIAGNOSIS — K5900 Constipation, unspecified: Secondary | ICD-10-CM | POA: Diagnosis not present

## 2019-08-23 DIAGNOSIS — R197 Diarrhea, unspecified: Secondary | ICD-10-CM | POA: Diagnosis not present

## 2019-08-23 DIAGNOSIS — Z83438 Family history of other disorder of lipoprotein metabolism and other lipidemia: Secondary | ICD-10-CM

## 2019-08-23 DIAGNOSIS — Z8249 Family history of ischemic heart disease and other diseases of the circulatory system: Secondary | ICD-10-CM

## 2019-08-23 DIAGNOSIS — E118 Type 2 diabetes mellitus with unspecified complications: Secondary | ICD-10-CM

## 2019-08-23 DIAGNOSIS — M5126 Other intervertebral disc displacement, lumbar region: Secondary | ICD-10-CM | POA: Diagnosis not present

## 2019-08-23 DIAGNOSIS — Z79899 Other long term (current) drug therapy: Secondary | ICD-10-CM

## 2019-08-23 DIAGNOSIS — I951 Orthostatic hypotension: Secondary | ICD-10-CM | POA: Diagnosis not present

## 2019-08-23 DIAGNOSIS — E109 Type 1 diabetes mellitus without complications: Secondary | ICD-10-CM

## 2019-08-23 DIAGNOSIS — I509 Heart failure, unspecified: Secondary | ICD-10-CM

## 2019-08-23 DIAGNOSIS — G4700 Insomnia, unspecified: Secondary | ICD-10-CM | POA: Diagnosis present

## 2019-08-23 DIAGNOSIS — I5021 Acute systolic (congestive) heart failure: Secondary | ICD-10-CM | POA: Diagnosis present

## 2019-08-23 DIAGNOSIS — R531 Weakness: Secondary | ICD-10-CM | POA: Diagnosis not present

## 2019-08-23 DIAGNOSIS — I50813 Acute on chronic right heart failure: Secondary | ICD-10-CM | POA: Diagnosis not present

## 2019-08-23 DIAGNOSIS — I878 Other specified disorders of veins: Secondary | ICD-10-CM | POA: Diagnosis present

## 2019-08-23 DIAGNOSIS — R0600 Dyspnea, unspecified: Secondary | ICD-10-CM | POA: Diagnosis not present

## 2019-08-23 DIAGNOSIS — R5381 Other malaise: Secondary | ICD-10-CM

## 2019-08-23 HISTORY — DX: Acidosis: E87.2

## 2019-08-23 HISTORY — DX: Other specified soft tissue disorders: M79.89

## 2019-08-23 HISTORY — DX: Acute on chronic diastolic (congestive) heart failure: I50.33

## 2019-08-23 HISTORY — DX: Acidosis, unspecified: E87.20

## 2019-08-23 HISTORY — DX: Chest pain, unspecified: R07.9

## 2019-08-23 LAB — HEPATIC FUNCTION PANEL
ALT: 25 U/L (ref 0–44)
AST: 39 U/L (ref 15–41)
Albumin: 3.8 g/dL (ref 3.5–5.0)
Alkaline Phosphatase: 64 U/L (ref 38–126)
Bilirubin, Direct: 0.2 mg/dL (ref 0.0–0.2)
Indirect Bilirubin: 0.7 mg/dL (ref 0.3–0.9)
Total Bilirubin: 0.9 mg/dL (ref 0.3–1.2)
Total Protein: 8.4 g/dL — ABNORMAL HIGH (ref 6.5–8.1)

## 2019-08-23 LAB — BRAIN NATRIURETIC PEPTIDE: B Natriuretic Peptide: 266.3 pg/mL — ABNORMAL HIGH (ref 0.0–100.0)

## 2019-08-23 LAB — MAGNESIUM: Magnesium: 2.1 mg/dL (ref 1.7–2.4)

## 2019-08-23 LAB — CBC
HCT: 39.7 % (ref 39.0–52.0)
Hemoglobin: 12.2 g/dL — ABNORMAL LOW (ref 13.0–17.0)
MCH: 24.6 pg — ABNORMAL LOW (ref 26.0–34.0)
MCHC: 30.7 g/dL (ref 30.0–36.0)
MCV: 80 fL (ref 80.0–100.0)
Platelets: 186 10*3/uL (ref 150–400)
RBC: 4.96 MIL/uL (ref 4.22–5.81)
RDW: 16.3 % — ABNORMAL HIGH (ref 11.5–15.5)
WBC: 9.2 10*3/uL (ref 4.0–10.5)
nRBC: 0 % (ref 0.0–0.2)

## 2019-08-23 LAB — URINALYSIS, ROUTINE W REFLEX MICROSCOPIC
Bilirubin Urine: NEGATIVE
Glucose, UA: NEGATIVE mg/dL
Hgb urine dipstick: NEGATIVE
Ketones, ur: NEGATIVE mg/dL
Leukocytes,Ua: NEGATIVE
Nitrite: NEGATIVE
Protein, ur: NEGATIVE mg/dL
Specific Gravity, Urine: 1.008 (ref 1.005–1.030)
pH: 7 (ref 5.0–8.0)

## 2019-08-23 LAB — BASIC METABOLIC PANEL
Anion gap: 17 — ABNORMAL HIGH (ref 5–15)
BUN: 18 mg/dL (ref 8–23)
CO2: 28 mmol/L (ref 22–32)
Calcium: 9.5 mg/dL (ref 8.9–10.3)
Chloride: 88 mmol/L — ABNORMAL LOW (ref 98–111)
Creatinine, Ser: 1.24 mg/dL (ref 0.61–1.24)
GFR calc Af Amer: >60 mL/min (ref >60)
GFR calc non Af Amer: >60 mL/min (ref >60)
Glucose, Bld: 122 mg/dL — ABNORMAL HIGH (ref 70–99)
Potassium: 3.6 mmol/L (ref 3.5–5.1)
Sodium: 133 mmol/L — ABNORMAL LOW (ref 135–145)

## 2019-08-23 LAB — TROPONIN I (HIGH SENSITIVITY)
Troponin I (High Sensitivity): 5 ng/L (ref <18)
Troponin I (High Sensitivity): 4 ng/L (ref <18)

## 2019-08-23 LAB — LACTIC ACID, PLASMA
Lactic Acid, Venous: 2.3 mmol/L (ref 0.5–1.9)
Lactic Acid, Venous: 1.8 mmol/L (ref 0.5–1.9)

## 2019-08-23 LAB — SARS CORONAVIRUS 2 BY RT PCR (HOSPITAL ORDER, PERFORMED IN ~~LOC~~ HOSPITAL LAB): SARS Coronavirus 2: NEGATIVE

## 2019-08-23 MED ORDER — ALBUTEROL SULFATE HFA 108 (90 BASE) MCG/ACT IN AERS
1.0000 | INHALATION_SPRAY | RESPIRATORY_TRACT | Status: DC | PRN
  Filled 2019-08-23: qty 6.7

## 2019-08-23 MED ORDER — SODIUM CHLORIDE 0.9% FLUSH: 3.0000 mL | INTRAVENOUS | Status: DC | PRN

## 2019-08-23 MED ORDER — FUROSEMIDE 10 MG/ML IJ SOLN
80.0000 mg | Freq: Once | INTRAMUSCULAR | Status: AC
  Administered 2019-08-23: 80 mg via INTRAVENOUS
  Filled 2019-08-23: qty 8

## 2019-08-23 MED ORDER — ONDANSETRON HCL 4 MG/2ML IJ SOLN
4.0000 mg | Freq: Four times a day (QID) | INTRAMUSCULAR | Status: DC | PRN
  Administered 2019-08-24 – 2019-08-26 (×2): 4 mg via INTRAVENOUS
  Filled 2019-08-23 (×2): qty 2

## 2019-08-23 MED ORDER — FUROSEMIDE 10 MG/ML IJ SOLN
80.0000 mg | Freq: Two times a day (BID) | INTRAMUSCULAR | Status: DC
  Administered 2019-08-24 – 2019-08-26 (×4): 80 mg via INTRAVENOUS
  Filled 2019-08-23 (×5): qty 8

## 2019-08-23 MED ORDER — LISINOPRIL 5 MG PO TABS
5.0000 mg | ORAL_TABLET | Freq: Every day | ORAL | Status: DC
  Administered 2019-08-24 – 2019-08-30 (×7): 5 mg via ORAL
  Filled 2019-08-23 (×7): qty 1

## 2019-08-23 MED ORDER — PRAVASTATIN SODIUM 10 MG PO TABS
20.0000 mg | ORAL_TABLET | Freq: Every day | ORAL | Status: DC
  Administered 2019-08-24 – 2019-08-29 (×6): 20 mg via ORAL
  Filled 2019-08-23 (×6): qty 2

## 2019-08-23 MED ORDER — PANTOPRAZOLE SODIUM 40 MG PO TBEC
40.0000 mg | DELAYED_RELEASE_TABLET | Freq: Two times a day (BID) | ORAL | Status: DC
  Administered 2019-08-23 – 2019-08-30 (×14): 40 mg via ORAL
  Filled 2019-08-23 (×14): qty 1

## 2019-08-23 MED ORDER — METOLAZONE 5 MG PO TABS
2.5000 mg | ORAL_TABLET | Freq: Two times a day (BID) | ORAL | Status: DC
  Administered 2019-08-23 – 2019-08-24 (×2): 2.5 mg via ORAL
  Filled 2019-08-23 (×5): qty 1

## 2019-08-23 MED ORDER — GABAPENTIN 400 MG PO CAPS
800.0000 mg | ORAL_CAPSULE | Freq: Three times a day (TID) | ORAL | Status: DC
  Administered 2019-08-23 – 2019-08-30 (×20): 800 mg via ORAL
  Filled 2019-08-23 (×20): qty 2

## 2019-08-23 MED ORDER — SODIUM CHLORIDE 0.9% FLUSH
3.0000 mL | Freq: Two times a day (BID) | INTRAVENOUS | Status: DC
  Administered 2019-08-23 – 2019-08-30 (×12): 3 mL via INTRAVENOUS

## 2019-08-23 MED ORDER — ACETAMINOPHEN 325 MG PO TABS
650.0000 mg | ORAL_TABLET | ORAL | Status: DC | PRN
  Administered 2019-08-24 – 2019-08-29 (×4): 650 mg via ORAL
  Filled 2019-08-23 (×4): qty 2

## 2019-08-23 MED ORDER — SODIUM CHLORIDE 0.9% FLUSH: 3.0000 mL | Freq: Once | INTRAVENOUS | Status: DC

## 2019-08-23 MED ORDER — GABAPENTIN 800 MG PO TABS
800.0000 mg | ORAL_TABLET | Freq: Three times a day (TID) | ORAL | Status: DC
  Filled 2019-08-23: qty 1

## 2019-08-23 MED ORDER — ALBUTEROL SULFATE (2.5 MG/3ML) 0.083% IN NEBU: 3.0000 mL | INHALATION_SOLUTION | RESPIRATORY_TRACT | Status: DC | PRN

## 2019-08-23 MED ORDER — SODIUM CHLORIDE 0.9 % IV SOLN: 250.0000 mL | INTRAVENOUS | Status: DC | PRN

## 2019-08-23 MED ORDER — BUPRENORPHINE HCL-NALOXONE HCL 8-2 MG SL SUBL
1.0000 | SUBLINGUAL_TABLET | Freq: Two times a day (BID) | SUBLINGUAL | Status: DC
  Administered 2019-08-23 – 2019-08-30 (×14): 1 via SUBLINGUAL
  Filled 2019-08-23 (×14): qty 1

## 2019-08-23 MED ORDER — BUPRENORPHINE HCL-NALOXONE HCL 8-2 MG SL FILM: 1.0000 | ORAL_FILM | Freq: Two times a day (BID) | SUBLINGUAL | Status: DC

## 2019-08-23 MED ORDER — POTASSIUM CHLORIDE 20 MEQ PO PACK
40.0000 meq | PACK | Freq: Once | ORAL | Status: AC
  Administered 2019-08-23: 40 meq via ORAL
  Filled 2019-08-23: qty 2

## 2019-08-23 MED ORDER — POLYETHYLENE GLYCOL 3350 17 G PO PACK
17.0000 g | PACK | Freq: Every day | ORAL | Status: DC | PRN
  Administered 2019-08-25 – 2019-08-28 (×2): 17 g via ORAL
  Filled 2019-08-23 (×2): qty 1

## 2019-08-23 MED ORDER — APIXABAN 5 MG PO TABS
5.0000 mg | ORAL_TABLET | Freq: Two times a day (BID) | ORAL | Status: DC
  Administered 2019-08-23 – 2019-08-30 (×14): 5 mg via ORAL
  Filled 2019-08-23 (×14): qty 1

## 2019-08-23 NOTE — ED Notes (Signed)
Upon introducing myself to patient, he had removed all monitoring equipment. Requested something to eat, offered [place] sandwich and pt declined.

## 2019-08-23 NOTE — ED Triage Notes (Addendum)
Pt bib ems from home with reports of chf exacerbation X3 days. Reports worsening of orthopnea over the last few days. Endorses increase in BLE edema. 12 lead unremarkable. [id number] 116/78 RR18 98%

## 2019-08-23 NOTE — Telephone Encounter (Signed)
Pt c/o Shortness Of Breath: STAT if SOB developed within the last 24 hours or pt is noticeably SOB on the phone  1. Are you currently SOB (can you hear that pt is SOB on the phone)? Yes per Marlowe Kays his home health nurse  2. How long have you been experiencing SOB? A couple of hours  3. Are you SOB when sitting or when up moving around? both  4. Are you currently experiencing any other symptoms? Swelling in both legs, HR 126 and BP 124/84, some dizziness and sweating. Tempertaure 97.9

## 2019-08-23 NOTE — H&P (Addendum)
History and Physical    Charles Gray QQI:[id number] DOB: [birth year] DOA: 08/23/2019  PCP: Enid Skeens., MD  Patient coming from:    Chief Complaint:  Chief Complaint  Patient presents with  . Shortness of Breath     HPI:  64 year old male with past medical history of diastolic congestive heart failure, chronic pain syndrome, cirrhosis with identified hypertensive gastropathy on EGD 02/2019, essential hypertension, pulmonary embolism diagnosed 2/21 on Eliquis therapy, obesity who presents to [facility] emergency department with complaints of shortness of breath.  Of note, patient was recently admitted to [facility] cardiology service for acute on chronic congestive heart failure from late April and discharged in early May.  Patient last followed up with Dr. Harriet Masson with Cardiology 5/7 where patient was placed on a short course of Levaquin due to increasing redness of the bilateral lower extremities and concern for cellulitis.  Patient explains that over the past week patient has been experiencing progressively worsening bilateral lower extremity swelling.  In addition to the swelling, patient has been experiencing increasing pain of the bilateral lower extremities.  Patient describes this pain as tight in quality, moderate in intensity and nonradiating.  Patient denies any alleviating or exacerbating factors.  As patient's bilateral lower extremity edema continue to progress, patient is also complaining of increasing abdominal girth and increasing weight gain.  Patient reports an increase in weight from 369pounds to 382 pounds at home.  Patient also endorses a 1 to 2-day history of progressively worsening shortness of breath.  Upon further questioning, patient denies noncompliance with his low-salt diet and denies noncompliance with his medication regimen.  Patient denies cough, fever, sick contacts or confirmed contact with COVID-19.  Patient symptoms continue to worsen until  this morning when the patient's home health nurse noted the patient's shortness of breath and progressive edema and called EMS who promptly brought the patient into [facility] emergency department for evaluation.  Upon evaluation in the emergency department, patient was felt to be clinically suffered from acute on chronic congestive heart failure and administer 80 mg of IV Lasix.  Troponin was performed and was unremarkable.  BNP was found to be elevated at 266 compared to 13.72 weeks ago.  COVID-19 testing was performed and was found to be negative.  Patient was also found to be exhibiting a mild lactic acidosis of 2.3.    Review of Systems: A 10-system review of systems has been performed and all systems are negative with the exception of what is listed in the HPI with the exception of the following:  CV: Complaining of atypical chest pain, mild in intensity, midsternal, beginning this morning at rest.   Past Medical History:  Diagnosis Date  . Acute respiratory failure with hypoxia ([place])   . Anemia of chronic disease   . Benign essential HTN   . Chronic heart failure with preserved EF 05/30/2019   Echo 07/2019: EF 55-60, GR 1 DD  . Chronic pain syndrome   . Cirrhosis of liver without ascites ([place])   . Debility 03/13/2019  . Drug induced constipation   . Dyspnea   . Fatty liver disease, nonalcoholic   . GIB (gastrointestinal bleeding) 03/04/2019  . Hyperlipidemia 01/24/2019  . Hypertension 01/24/2019  . Hypoalbuminemia due to protein-calorie malnutrition ([place])   . Hypokalemia   . Hyponatremia   . Hypotension due to drugs   . Insomnia 01/24/2019  . Lumbar disc herniation 01/24/2019  . Morbid obesity with BMI of 40.0-44.9, adult ([place]) 05/30/2019  .  Normocytic anemia 06/08/2019  . Peripheral edema   . Portal hypertensive gastropathy ([place]) 03/06/2019   Seen on EGD 03/06/2019  . Pulmonary emboli ([place]) 06/08/2019  . Pulmonary embolism ([place]) 06/08/2019  . Shock ([place])   .  Thrombocytopenia ([place])     Past Surgical History:  Procedure Laterality Date  . ESOPHAGOGASTRODUODENOSCOPY (EGD) WITH PROPOFOL N/A 03/05/2019   Procedure: ESOPHAGOGASTRODUODENOSCOPY (EGD) WITH PROPOFOL;  Surgeon: Doran Stabler, MD;  Location: [place];  Service: Gastroenterology;  Laterality: N/A;  Large Blood Clot Removed  . ESOPHAGOGASTRODUODENOSCOPY (EGD) WITH PROPOFOL N/A 03/06/2019   Procedure: ESOPHAGOGASTRODUODENOSCOPY (EGD) WITH PROPOFOL;  Surgeon: Thornton Park, MD;  Location: [place];  Service: Gastroenterology;  Laterality: N/A;  . HEMORROIDECTOMY    . RIGHT HEART CATH N/A 08/09/2019   Procedure: RIGHT HEART CATH;  Surgeon: Larey Dresser, MD;  Location: [place] [facility];  Service: Cardiovascular;  Laterality: N/A;     reports that he has never smoked. He has never used smokeless tobacco. He reports previous drug use. Drug: Marijuana. He reports that he does not drink alcohol.  Allergies  Allergen Reactions  . Penicillins     Did it involve swelling of the face/tongue/throat, SOB, or low BP? N/A Did it involve sudden or severe rash/hives, skin peeling, or any reaction on the inside of your mouth or nose? N/A Did you need to seek medical attention at a hospital or doctor's office?N/A When did it last happen?N/A If all above answers are "NO", may proceed with cephalosporin use.    Family History  Problem Relation Age of Onset  . Hyperlipidemia Mother   . Hypertension Mother   . Stroke Mother   . CAD Maternal Grandmother   . CAD Maternal Grandfather      Prior to Admission medications   Medication Sig Start Date End Date Taking? Authorizing Provider  acetaminophen (TYLENOL) 325 MG tablet Take 2 tablets (650 mg total) by mouth every 4 (four) hours as needed for fever or mild pain. 03/23/19   Angiulli, Lavon Paganini, PA-C  apixaban (ELIQUIS) 5 MG TABS tablet Take 1 tablet (5 mg total) by mouth 2 (two) times daily. 08/12/19   Strader, Fransisco Hertz, PA-C    Buprenorphine HCl-Naloxone HCl 8-2 MG FILM Place 1 tablet under the tongue in the morning and at bedtime.     [provider]  diphenhydrAMINE (BENADRYL) 25 MG tablet Take 25 mg by mouth every 6 (six) hours as needed for itching or allergies.    [provider]  furosemide (LASIX) 80 MG tablet Take 1 tablet (80 mg total) by mouth 2 (two) times daily. 08/12/19 08/11/20  Richardson Dopp T, PA-C  gabapentin (NEURONTIN) 800 MG tablet Take 1 tablet (800 mg total) by mouth 3 (three) times daily. 03/23/19   Angiulli, Lavon Paganini, PA-C  levofloxacin (LEVAQUIN) 250 MG tablet Take 1 tablet (250 mg total) by mouth daily. Take 1 tablet by mouth daily for 3 days 08/18/19   Tobb, Kardie, DO  lisinopril (ZESTRIL) 5 MG tablet Take 1 tablet (5 mg total) by mouth daily. 08/12/19 08/11/20  Richardson Dopp T, PA-C  metolazone (ZAROXOLYN) 5 MG tablet Take 1 tablet (5 mg total) by mouth daily as needed (weight gain of 2-3 lbs in 1 day or greater). 08/12/19 08/11/20  Richardson Dopp T, PA-C  pantoprazole (PROTONIX) 40 MG tablet Take 40 mg by mouth 2 (two) times daily.    [provider]  polyethylene glycol (MIRALAX / GLYCOLAX) 17 g packet Take 17  g by mouth daily. Patient taking differently: Take 17 g by mouth daily as needed for mild constipation.  03/23/19   Angiulli, Lavon Paganini, PA-C  potassium chloride SA (KLOR-CON) 20 MEQ tablet Take 2 tablets (40 mEq total) by mouth 2 (two) times daily. 08/12/19 08/11/20  Richardson Dopp T, PA-C  pravastatin (PRAVACHOL) 20 MG tablet Take 1 tablet (20 mg total) by mouth daily. 03/23/19   Angiulli, Lavon Paganini, PA-C  VENTOLIN HFA 108 (90 Base) MCG/ACT inhaler Inhale 1 puff into the lungs every 4 (four) hours as needed for shortness of breath. 03/23/19   Angiulli, Lavon Paganini, PA-C  zolpidem (AMBIEN) 10 MG tablet Take 10 mg by mouth at bedtime as needed for sleep.  05/19/19   [provider]    Physical Exam: Vitals:   08/23/19 1415 08/23/19 1430 08/23/19 1445 08/23/19 1943  BP:  124/72 107/61 115/75 (!) 140/94  Pulse: 89 74 75 90  Resp: 13 12 14 17   Temp:      TempSrc:      SpO2: 98% 94% 98% 96%  Weight:      Height:        Constitutional: Acute alert and oriented x3, patient is in mild distress due to bilateral lower extremity pain.  It is obese. Skin: Significant hyperemia and thickening of the skin of the anterior bilateral lower extremities.  Associated mild warmth noted.  Otherwise, no rashes or lesions seen, good skin turgor.   Eyes: Pupils are equally reactive to light.  No evidence of scleral icterus or conjunctival pallor.  ENMT: Moist mucous membranes noted.  Posterior pharynx clear of any exudate or lesions.   Neck: normal, supple, no masses, no thyromegaly.  Unable to assess for jugular venous elevation due to body habitus. Respiratory: Diminished breath sounds at the bases with faint bibasilar rales.  No evidence of wheezing.  Normal respiratory effort. No accessory muscle use.  Cardiovascular: Regular rate and rhythm, no murmurs / rubs / gallops.  Significant bilateral lower extremity pitting edema that tracks in the distal lower extremities up to the knees.  2+ pedal pulses. No carotid bruits.  Chest:   Nontender without crepitus or deformity.   Back:   Nontender without crepitus or deformity. Abdomen: Protuberant abdomen that is soft and nontender.  No evidence of intra-abdominal masses.  Positive bowel sounds noted in all quadrants.   Musculoskeletal: Significant tenderness of the distal bilateral lower extremities and areas of significant edema.  Please see skin examination findings above for further detail as patient also is exhibiting mild redness and warmth of the extremities.  No other deformities noted. Good ROM, no contractures. Normal muscle tone.  Neurologic: CN 2-12 grossly intact. Sensation intact, strength noted to be 5 out of 5 in all 4 extremities.  Patient is following all commands.  Patient is responsive to verbal stimuli.     Psychiatric: Patient presents as a depressed mood with appropriate affect.  Patient seems to possess insight as to theircurrent situation.     Labs on Admission: I have personally reviewed following labs and imaging studies -   CBC: Recent Labs  Lab 08/23/19 1213  WBC 9.2  HGB 12.2*  HCT 39.7  MCV 80.0  PLT 275   Basic Metabolic Panel: Recent Labs  Lab 08/18/19 1133 08/23/19 1213 08/23/19 1355  NA 135 133*  --   K 2.9* 3.6  --   CL 87* 88*  --   CO2 28 28  --   GLUCOSE 107* 122*  --  BUN 24 18  --   CREATININE 1.18 1.24  --   CALCIUM 9.4 9.5  --   MG 2.0  --  2.1   GFR: Estimated Creatinine Clearance: 110.7 mL/min (by C-G formula based on SCr of 1.24 mg/dL). Liver Function Tests: Recent Labs  Lab 08/23/19 1355  AST 39  ALT 25  ALKPHOS 64  BILITOT 0.9  PROT 8.4*  ALBUMIN 3.8   No results for input(s): LIPASE, AMYLASE in the last 168 hours. No results for input(s): AMMONIA in the last 168 hours. Coagulation Profile: No results for input(s): INR, PROTIME in the last 168 hours. Cardiac Enzymes: No results for input(s): CKTOTAL, CKMB, CKMBINDEX, TROPONINI in the last 168 hours. BNP (last 3 results) Recent Labs    01/24/19 1121 02/27/19 1106  PROBNP 52 37   HbA1C: No results for input(s): HGBA1C in the last 72 hours. CBG: No results for input(s): GLUCAP in the last 168 hours. Lipid Profile: No results for input(s): CHOL, HDL, LDLCALC, TRIG, CHOLHDL, LDLDIRECT in the last 72 hours. Thyroid Function Tests: No results for input(s): TSH, T4TOTAL, FREET4, T3FREE, THYROIDAB in the last 72 hours. Anemia Panel: No results for input(s): VITAMINB12, FOLATE, FERRITIN, TIBC, IRON, RETICCTPCT in the last 72 hours. Urine analysis:    Component Value Date/Time   COLORURINE STRAW (A) 08/23/2019 1248   APPEARANCEUR CLEAR 08/23/2019 1248   LABSPEC 1.008 08/23/2019 1248   PHURINE 7.0 08/23/2019 1248   GLUCOSEU NEGATIVE 08/23/2019 1248   HGBUR NEGATIVE 08/23/2019  [employer] 08/23/2019 1248   [place] 08/23/2019 [place] 08/23/2019 1248   NITRITE NEGATIVE 08/23/2019 [place] 08/23/2019 1248    Radiological Exams on Admission - Personally Reviewed: DG Chest 2 View  Result Date: 08/23/2019 CLINICAL DATA:  Shortness of breath EXAM: CHEST - 2 VIEW COMPARISON:  08/06/2019 FINDINGS: The heart size and mediastinal contours are within normal limits. Both lungs are clear. The visualized skeletal structures are unremarkable. IMPRESSION: No active cardiopulmonary disease. Electronically Signed   By: Kathreen Devoid   On: 08/23/2019 12:58   VAS [place] LOWER EXTREMITY VENOUS (DVT) (ONLY [facility] & [facility])  Result Date: 08/23/2019  Lower Venous DVTStudy Indications: Swelling.  Risk Factors: DVT. Limitations: Body habitus, poor ultrasound/tissue interface and patient pain tolerance. Comparison Study: 06/08/2019 - Left Popliteal DVT Performing Technologist: Oliver Hum RVT  Examination Guidelines: A complete evaluation includes B-mode imaging, spectral Doppler, color Doppler, and power Doppler as needed of all accessible portions of each vessel. Bilateral testing is considered an integral part of a complete examination. Limited examinations for reoccurring indications may be performed as noted. The reflux portion of the exam is performed with the patient in reverse Trendelenburg.  +---------+---------------+---------+-----------+----------+--------------+ RIGHT    CompressibilityPhasicitySpontaneityPropertiesThrombus Aging +---------+---------------+---------+-----------+----------+--------------+ CFV      Full           Yes      Yes                                 +---------+---------------+---------+-----------+----------+--------------+ SFJ      Full                                                        +---------+---------------+---------+-----------+----------+--------------+ FV Prox  Full                                                         +---------+---------------+---------+-----------+----------+--------------+ FV Mid   Full                                                        +---------+---------------+---------+-----------+----------+--------------+ FV DistalFull                                                        +---------+---------------+---------+-----------+----------+--------------+ PFV      Full                                                        +---------+---------------+---------+-----------+----------+--------------+ POP      Full           Yes      Yes                                 +---------+---------------+---------+-----------+----------+--------------+ PTV      Full                                                        +---------+---------------+---------+-----------+----------+--------------+ PERO     Full                                                        +---------+---------------+---------+-----------+----------+--------------+   +---------+---------------+---------+-----------+----------+--------------+ LEFT     CompressibilityPhasicitySpontaneityPropertiesThrombus Aging +---------+---------------+---------+-----------+----------+--------------+ CFV      Full           Yes      Yes                                 +---------+---------------+---------+-----------+----------+--------------+ SFJ      Full                                                        +---------+---------------+---------+-----------+----------+--------------+ FV Prox  Full                                                        +---------+---------------+---------+-----------+----------+--------------+  FV Mid   Full                                                        +---------+---------------+---------+-----------+----------+--------------+ FV DistalFull                                                         +---------+---------------+---------+-----------+----------+--------------+ PFV      Full                                                        +---------+---------------+---------+-----------+----------+--------------+ POP      Full           Yes      Yes                                 +---------+---------------+---------+-----------+----------+--------------+ PTV      Full                                                        +---------+---------------+---------+-----------+----------+--------------+ PERO                                                  Not visualized +---------+---------------+---------+-----------+----------+--------------+     Summary: RIGHT: - There is no evidence of deep vein thrombosis in the lower extremity. However, portions of this examination were limited- see technologist comments above.  - No cystic structure found in the popliteal fossa.  LEFT: - There is no evidence of deep vein thrombosis in the lower extremity. However, portions of this examination were limited- see technologist comments above.  - No cystic structure found in the popliteal fossa.  *See table(s) above for measurements and observations. Electronically signed by Curt Jews MD on 08/23/2019 at 7:28:18 PM.    Final     EKG: Personally reviewed.  Rhythm is sinus tachycardia with heart rate of 123 bpm with left anterior fascicular block.  No dynamic ST segment changes appreciated.  Assessment/Plan Active Problems:   Acute on chronic diastolic CHF (congestive heart failure) ([place])   Patient presenting with progressive bilateral lower extremity edema, increasing abdominal girth and rapidly increasing weight gain despite compliance with medications and dietary compliance all concerning for recurrent acute on chronic congestive heart failure.  Patient was originally admitted to the cardiology service in late April but upon discussion between the emergency department provider and  cardiology on this occasion they have requested the hospitalist service admit.  Placing patient on metolazone 2.5 mg IV twice daily in addition to Lasix 80 mg IV twice daily.  Strict input and output monitoring  Monitoring renal function electrolytes with serial  chemistries.  Daily weights  Monitoring patient on telemetry  Patient has a history of having significant tolerance to diuretics.  Will consider cardiology consultation if patient is not responsive to diuretic therapy.  Chest Pain   Patient complaining of atypical chest pain at rest this morning.  Patient is currently chest pain-free  Troponin unremarkable  No dynamic ST segment change noted on EKG  Monitoring patient on telemetry  Cycling cardiac enzymes  Outpatient follow-up for noninvasive ischemic assessment in the future.  Lactic Acidosis   Mild lactic acidosis  Assuming this is a type a lactic acidosis, possibly secondary to transient hypoperfusion due to acute congestive heart failure   No real clinical evidence of infection in the absence of any SIRS criteria  Will monitor lactic acid levels to ensure downtrending and resolution    Mixed hyperlipidemia   Continue home regimen of statin therapy    Chronic pain syndrome   Continue home regimen of Suboxone  This regimen has been confirmed via the controlled substance database.    Hyponatremia   Mild hyponatremia  Considering clinical presentation this may be secondary to volume overload  We will monitor for improvement with intravenous diuretics  Serial chemistries to monitor sodium levels    History of pulmonary embolism   Recently diagnosed pulmonary embolism and February 2021  Patient is compliant with Eliquis which will be continued during this hospitalization    Redness and swelling of lower leg   Notable hyperemia and mild warmth of the bilateral lower extremities  Considering that this is a bilateral finding with absence  of fever or leukocytosis I doubt that this is cellulitis.  Obtaining CRP, blood cultures have already been ordered by the emergency department provider.  I do not believe that antibiotics are indicated at this time  Monitor for improvement of bilateral lower extremity pain and redness with improving edema/diuresis    Class 2 severe obesity due to excess calories with serious comorbidity and body mass index (BMI) of 39.0 to 39.9 in adult [facility])   Counseling daily on caloric restriction and regular physical activity   Code Status:  Full code Family Communication: Deferred  Status is: Observation  The patient remains OBS appropriate and will d/c before 2 midnights.  Dispo: The patient is from: Home              Anticipated d/c is to: Home              Anticipated d/c date is: 2 days              Patient currently is not medically stable to d/c.        Vernelle Emerald MD [facility] Pager [phone]  If 7PM-7AM, please contact night-coverage [email or web address] Use universal [place] password for that web site. If you do not have the password, please call the hospital operator.  08/23/2019, 8:37 PM

## 2019-08-23 NOTE — ED Notes (Signed)
Pt removed monitoring equipment again.

## 2019-08-23 NOTE — ED Notes (Addendum)
Pt was ambulating in hall with a HR of 109. Noticed pt HR dropped to 50. Asked pt how was he feeling, pt stated dizzy. O2 remained at 96%. Pt returned back to room. Nurse notified.

## 2019-08-23 NOTE — H&P (Deleted)
Cardiology Admission History and Physical:   Patient ID: Tanyon Alipio MRN: [id number]; DOB: 12-08-1955   Admission date: 08/23/2019  Primary Care Provider: Enid Skeens., MD Primary Cardiologist: Berniece Salines, DO  Primary Electrophysiologist:  None  Chief Complaint:  Shortness of breath  Patient Profile:   Garren Greenman is a 64 y.o. male with with history of HTN, HLD, morbid obesity, nonalcoholic fatty liver disease, herniated disc with chronic pain maintained on suboxone, pulmonary embolism, morbid obesity who presents with sob.  History of Present Illness:   Mr. Swiss is followed by Dr. Harriet Masson. He was admitted to [facility] for heart failure in April 2021. RHC showed normal filling pressures, normal PA pressure, normal cardiac output. Echo showed EF 55-60%, G1DD.  He was last seen 08/18/19 after a hospitalization [facility] for heart failure. He was started on levaquin for leg cellulitis. He was euvolemic on exam. LE [place] ordered which did not show DVT. He called the office with tachycardia, sob, and dizziness and was advised to go to the ED.   He presented to the ED 08/23/19 for lower leg edema and shortness of breath. Says his weight was 368 last week at his follow-up. Over the week his weight has increased by 8-9 lbs and feels like his legs are very tight. He has been taking his lasix and metolazone. He has been taking eliquis for DVT. Says he has decreased the amount of salt he has been taking but still has some canned soup occasionally. Also reports chest pain that happened this morning while at rest. It was substernal and pressure like in nature and lasted for about 7 minutes. Denies recent fever, chills, abd pain.   In the ED BP 115/75, pulse 75, afebrile, RR 14, 98% O2. Labs show sodium 133, glucose 122, anion gap 17, Hgb 12.2. BNP 266. Lactic acidosis of 2.3. UA negative. COVID pending. CXR unremarkable. EKG shows sinus tach, which later normalized to the 70s. HE was given IV lasix 80 mg in  the ED.   Past Medical History:  Diagnosis Date  . Acute respiratory failure with hypoxia ([place])   . Anemia of chronic disease   . Benign essential HTN   . Chronic heart failure with preserved EF 05/30/2019   Echo 07/2019: EF 55-60, GR 1 DD  . Chronic pain syndrome   . Cirrhosis of liver without ascites ([place])   . Debility 03/13/2019  . Drug induced constipation   . Dyspnea   . Fatty liver disease, nonalcoholic   . GIB (gastrointestinal bleeding) 03/04/2019  . Hyperlipidemia 01/24/2019  . Hypertension 01/24/2019  . Hypoalbuminemia due to protein-calorie malnutrition ([place])   . Hypokalemia   . Hyponatremia   . Hypotension due to drugs   . Insomnia 01/24/2019  . Lumbar disc herniation 01/24/2019  . Morbid obesity with BMI of 40.0-44.9, adult ([place]) 05/30/2019  . Normocytic anemia 06/08/2019  . Peripheral edema   . Pulmonary emboli ([place]) 06/08/2019  . Pulmonary embolism ([place]) 06/08/2019  . Shock ([place])   . Thrombocytopenia ([place])     Past Surgical History:  Procedure Laterality Date  . ESOPHAGOGASTRODUODENOSCOPY (EGD) WITH PROPOFOL N/A 03/05/2019   Procedure: ESOPHAGOGASTRODUODENOSCOPY (EGD) WITH PROPOFOL;  Surgeon: Doran Stabler, MD;  Location: [place];  Service: Gastroenterology;  Laterality: N/A;  Large Blood Clot Removed  . ESOPHAGOGASTRODUODENOSCOPY (EGD) WITH PROPOFOL N/A 03/06/2019   Procedure: ESOPHAGOGASTRODUODENOSCOPY (EGD) WITH PROPOFOL;  Surgeon: Thornton Park, MD;  Location: [place];  Service: Gastroenterology;  Laterality: N/A;  . HEMORROIDECTOMY    .  RIGHT HEART CATH N/A 08/09/2019   Procedure: RIGHT HEART CATH;  Surgeon: Larey Dresser, MD;  Location: [place] [facility];  Service: Cardiovascular;  Laterality: N/A;     Medications Prior to Admission: Prior to Admission medications   Medication Sig Start Date End Date Taking? Authorizing Provider  acetaminophen (TYLENOL) 325 MG tablet Take 2 tablets (650 mg total) by mouth every 4 (four) hours as  needed for fever or mild pain. 03/23/19   Angiulli, Lavon Paganini, PA-C  apixaban (ELIQUIS) 5 MG TABS tablet Take 1 tablet (5 mg total) by mouth 2 (two) times daily. 08/12/19   Strader, Fransisco Hertz, PA-C  Buprenorphine HCl-Naloxone HCl 8-2 MG FILM Place 1 tablet under the tongue in the morning and at bedtime.     [provider]  diphenhydrAMINE (BENADRYL) 25 MG tablet Take 25 mg by mouth every 6 (six) hours as needed for itching or allergies.    [provider]  furosemide (LASIX) 80 MG tablet Take 1 tablet (80 mg total) by mouth 2 (two) times daily. 08/12/19 08/11/20  Richardson Dopp T, PA-C  gabapentin (NEURONTIN) 800 MG tablet Take 1 tablet (800 mg total) by mouth 3 (three) times daily. 03/23/19   Angiulli, Lavon Paganini, PA-C  levofloxacin (LEVAQUIN) 250 MG tablet Take 1 tablet (250 mg total) by mouth daily. Take 1 tablet by mouth daily for 3 days 08/18/19   Tobb, Kardie, DO  lisinopril (ZESTRIL) 5 MG tablet Take 1 tablet (5 mg total) by mouth daily. 08/12/19 08/11/20  Richardson Dopp T, PA-C  metolazone (ZAROXOLYN) 5 MG tablet Take 1 tablet (5 mg total) by mouth daily as needed (weight gain of 2-3 lbs in 1 day or greater). 08/12/19 08/11/20  Richardson Dopp T, PA-C  pantoprazole (PROTONIX) 40 MG tablet Take 40 mg by mouth 2 (two) times daily.    [provider]  polyethylene glycol (MIRALAX / GLYCOLAX) 17 g packet Take 17 g by mouth daily. Patient taking differently: Take 17 g by mouth daily as needed for mild constipation.  03/23/19   Angiulli, Lavon Paganini, PA-C  potassium chloride SA (KLOR-CON) 20 MEQ tablet Take 2 tablets (40 mEq total) by mouth 2 (two) times daily. 08/12/19 08/11/20  Richardson Dopp T, PA-C  pravastatin (PRAVACHOL) 20 MG tablet Take 1 tablet (20 mg total) by mouth daily. 03/23/19   Angiulli, Lavon Paganini, PA-C  VENTOLIN HFA 108 (90 Base) MCG/ACT inhaler Inhale 1 puff into the lungs every 4 (four) hours as needed for shortness of breath. 03/23/19   Angiulli, Lavon Paganini, PA-C  zolpidem (AMBIEN) 10  MG tablet Take 10 mg by mouth at bedtime as needed for sleep.  05/19/19   [provider]     Allergies:    Allergies  Allergen Reactions  . Penicillins     Did it involve swelling of the face/tongue/throat, SOB, or low BP? N/A Did it involve sudden or severe rash/hives, skin peeling, or any reaction on the inside of your mouth or nose? N/A Did you need to seek medical attention at a hospital or doctor's office?N/A When did it last happen?N/A If all above answers are "NO", may proceed with cephalosporin use.    Social History:   Social History   Socioeconomic History  . Marital status: Unknown    Spouse name: Not on file  . Number of children: Not on file  . Years of education: Not on file  . Highest education level: Not on file  Occupational History  . Not  on file  Tobacco Use  . Smoking status: Never Smoker  . Smokeless tobacco: Never Used  Substance and Sexual Activity  . Alcohol use: Never  . Drug use: Not Currently    Types: Marijuana    Comment: Smoked for 30 years  . Sexual activity: Not on file  Other Topics Concern  . Not on file  Social History Narrative  . Not on file   Social Determinants of Health   Financial Resource Strain:   . Difficulty of Paying Living Expenses:   Food Insecurity:   . Worried About Charity fundraiser in the Last Year:   . Arboriculturist in the Last Year:   Transportation Needs:   . Film/video editor (Medical):   Marland Kitchen Lack of Transportation (Non-Medical):   Physical Activity:   . Days of Exercise per Week:   . Minutes of Exercise per Session:   Stress:   . Feeling of Stress :   Social Connections:   . Frequency of Communication with Friends and Family:   . Frequency of Social Gatherings with Friends and Family:   . Attends Religious Services:   . Active Member of Clubs or Organizations:   . Attends Archivist Meetings:   Marland Kitchen Marital Status:   Intimate Partner Violence:   . Fear of Current or  Ex-Partner:   . Emotionally Abused:   Marland Kitchen Physically Abused:   . Sexually Abused:     Family History:   The patient's family history includes CAD in his maternal grandfather and maternal grandmother; Hyperlipidemia in his mother; Hypertension in his mother; Stroke in his mother.    ROS:  Please see the history of present illness.  All other ROS reviewed and negative.     Physical Exam/Data:   Vitals:   08/23/19 1322 08/23/19 1415 08/23/19 1430 08/23/19 1445  BP:  124/72 107/61 115/75  Pulse:  89 74 75  Resp:  13 12 14   Temp:      TempSrc:      SpO2:  98% 94% 98%  Weight: (!) 170.1 kg     Height: 6' 10"  (2.083 m)      No intake or output data in the 24 hours ending 08/23/19 1641 Last 3 Weights 08/23/2019 08/18/2019 08/12/2019  Weight (lbs) 375 lb 369 lb 381 lb 2.8 oz  Weight (kg) 170.099 kg 167.377 kg 172.9 kg     Body mass index is 39.21 kg/m.  General:  Well nourished, well developed, in no acute distress HEENT: normal Lymph: no adenopathy Neck: no JVD Endocrine:  No thryomegaly Vascular: No carotid bruits; FA pulses 2+ bilaterally without bruits  Cardiac:  normal S1, S2; RRR; no murmur  Lungs:  clear to auscultation bilaterally, no wheezing, rhonchi or rales  Abd: soft, nontender, no hepatomegaly  Ext: 2+ lower leg edema Musculoskeletal:  No deformities, BUE and BLE strength normal and equal Skin: warm and dry  Neuro:  CNs 2-12 intact, no focal abnormalities noted Psych:  Normal affect    EKG:  The ECG that was done 08/23/19 was personally reviewed and demonstrates sinus tachycardia, LAFB, no ST/T wave changes  Relevant CV Studies:  Echo 07/2019  1. Suboptimal image quality for diagnostic purposes.  2. Left ventricular ejection fraction, by estimation, is 55 to 60%. The  left ventricle has normal function. Left ventricular endocardial border  not optimally defined to evaluate regional wall motion. Left ventricular  diastolic parameters are consistent  with Grade  I diastolic  dysfunction (impaired relaxation).  3. Right ventricular systolic function was not well visualized. The right  ventricular size is not well visualized.  4. The mitral valve is grossly normal. No evidence of mitral valve  regurgitation. No evidence of mitral stenosis.  5. The aortic valve was not well visualized. Aortic valve regurgitation  is not visualized. No aortic stenosis is present.   Right heart cath 08/09/19 1. Normal filling pressures.  2. Normal PA pressure.  3. Normal cardiac output.   Laboratory Data:  High Sensitivity Troponin:   Recent Labs  Lab 08/06/19 1728 08/23/19 1355  TROPONINIHS 5 5      Chemistry Recent Labs  Lab 08/18/19 1133 08/23/19 1213  NA 135 133*  K 2.9* 3.6  CL 87* 88*  CO2 28 28  GLUCOSE 107* 122*  BUN 24 18  CREATININE 1.18 1.24  CALCIUM 9.4 9.5  GFRNONAA 65 >60  GFRAA 75 >60  ANIONGAP  --  17*    Recent Labs  Lab 08/23/19 1355  PROT 8.4*  ALBUMIN 3.8  AST 39  ALT 25  ALKPHOS 64  BILITOT 0.9   Hematology Recent Labs  Lab 08/23/19 1213  WBC 9.2  RBC 4.96  HGB 12.2*  HCT 39.7  MCV 80.0  MCH 24.6*  MCHC 30.7  RDW 16.3*  PLT 186   BNP Recent Labs  Lab 08/23/19 1400  BNP 266.3*    DDimer No results for input(s): DDIMER in the last 168 hours.   Radiology/Studies:  DG Chest 2 View  Result Date: 08/23/2019 CLINICAL DATA:  Shortness of breath EXAM: CHEST - 2 VIEW COMPARISON:  08/06/2019 FINDINGS: The heart size and mediastinal contours are within normal limits. Both lungs are clear. The visualized skeletal structures are unremarkable. IMPRESSION: No active cardiopulmonary disease. Electronically Signed   By: Kathreen Devoid   On: 08/23/2019 12:58   VAS [place] LOWER EXTREMITY VENOUS (DVT) (ONLY [facility] & [facility])  Result Date: 08/23/2019  Lower Venous DVTStudy Indications: Swelling.  Risk Factors: DVT. Limitations: Body habitus, poor ultrasound/tissue interface and patient pain tolerance. Comparison Study: 06/08/2019 -  Left Popliteal DVT Performing Technologist: Oliver Hum RVT  Examination Guidelines: A complete evaluation includes B-mode imaging, spectral Doppler, color Doppler, and power Doppler as needed of all accessible portions of each vessel. Bilateral testing is considered an integral part of a complete examination. Limited examinations for reoccurring indications may be performed as noted. The reflux portion of the exam is performed with the patient in reverse Trendelenburg.  +---------+---------------+---------+-----------+----------+--------------+ RIGHT    CompressibilityPhasicitySpontaneityPropertiesThrombus Aging +---------+---------------+---------+-----------+----------+--------------+ CFV      Full           Yes      Yes                                 +---------+---------------+---------+-----------+----------+--------------+ SFJ      Full                                                        +---------+---------------+---------+-----------+----------+--------------+ FV Prox  Full                                                        +---------+---------------+---------+-----------+----------+--------------+  FV Mid   Full                                                        +---------+---------------+---------+-----------+----------+--------------+ FV DistalFull                                                        +---------+---------------+---------+-----------+----------+--------------+ PFV      Full                                                        +---------+---------------+---------+-----------+----------+--------------+ POP      Full           Yes      Yes                                 +---------+---------------+---------+-----------+----------+--------------+ PTV      Full                                                        +---------+---------------+---------+-----------+----------+--------------+ PERO     Full                                                         +---------+---------------+---------+-----------+----------+--------------+   +---------+---------------+---------+-----------+----------+--------------+ LEFT     CompressibilityPhasicitySpontaneityPropertiesThrombus Aging +---------+---------------+---------+-----------+----------+--------------+ CFV      Full           Yes      Yes                                 +---------+---------------+---------+-----------+----------+--------------+ SFJ      Full                                                        +---------+---------------+---------+-----------+----------+--------------+ FV Prox  Full                                                        +---------+---------------+---------+-----------+----------+--------------+ FV Mid   Full                                                        +---------+---------------+---------+-----------+----------+--------------+  FV DistalFull                                                        +---------+---------------+---------+-----------+----------+--------------+ PFV      Full                                                        +---------+---------------+---------+-----------+----------+--------------+ POP      Full           Yes      Yes                                 +---------+---------------+---------+-----------+----------+--------------+ PTV      Full                                                        +---------+---------------+---------+-----------+----------+--------------+ PERO     Full                                                        +---------+---------------+---------+-----------+----------+--------------+     Summary: RIGHT: - There is no evidence of deep vein thrombosis in the lower extremity. However, portions of this examination were limited- see technologist comments above.  - No cystic structure found in the popliteal  fossa.  LEFT: - There is no evidence of deep vein thrombosis in the lower extremity. However, portions of this examination were limited- see technologist comments above.  - No cystic structure found in the popliteal fossa.  *See table(s) above for measurements and observations.    Preliminary    {  Assessment and Plan:    Acute diastolic Heart Failure - Patient presents with worsening lower leg edema for the last week with weight gain. He was recently admitted with heart failure in April 2021 - He has been taking his lasix 80 mgBID and metolazone but has not seemed to help - He occasionally eats canned foods but has been trying to lower salt intake - RHC on last admission showed normal filling pressures, meaning patient might have been dry at that point - Recent echo showed EF 55-60% with G1DD - In the ER BNP up to 266  - On exam he has lower leg edema, given IV lasix 80 in the ED - Start IV lasix 80 mg BID - monitor strict I/Os, daily weights, and creatinine with diuresis - MD to see  Cellulitis - prescribed levaquin for possible lower leg cellulitis - WBC 9.2  Chest pain - HS troponin negative - continue to monitor  HTN - lisinopril 37m daily - IV lasix  DVT - Eliquis - Recent LE [place] for DVT  Lactic acidosis - up to 2.3 - Blood cultures collected - WBC normal   For questions or updates, please contact  [facility] Please consult [email or web address] for contact info under        Signed, Eleonore Shippee Ninfa Meeker, PA-C  08/23/2019 4:41 PM

## 2019-08-23 NOTE — ED Notes (Signed)
Pt reports on Abx, Lasix and Zaroxolyn along with Eliquis for known DVT.   States in today with increased SOB and difficulty catching his breath.  Currently no outward resp distress or pain responses.

## 2019-08-23 NOTE — Consult Note (Addendum)
Cardiology Admission History and Physical:   Patient ID: Charles Gray MRN: [id number]; DOB: [birth year]   Admission date: 08/23/2019  Primary Care Provider: Enid Skeens., MD Primary Cardiologist: Berniece Salines, DO  Primary Electrophysiologist:  None  Chief Complaint:  Shortness of breath  Patient Profile:   Charles Gray is a 64 y.o. male with with history of HTN, HLD, morbid obesity, nonalcoholic fatty liver disease, herniated disc with chronic pain maintained on suboxone, pulmonary embolism, morbid obesity who presents with sob.  History of Present Illness:   Charles Gray is followed by Dr. Harriet Masson. He was admitted to [facility] for heart failure in April 2021. RHC showed normal filling pressures, normal PA pressure, normal cardiac output. Echo showed EF 55-60%, G1DD.  He was last seen 08/18/19 after a hospitalization [facility] for heart failure. He was started on levaquin for leg cellulitis. He was euvolemic on exam. LE [place] ordered which did not show DVT. He called the office with tachycardia, sob, and dizziness and was advised to go to the ED.   He presented to the ED 08/23/19 for lower leg edema and shortness of breath. Says his weight was 368 last week at his follow-up. Over the week his weight has increased by 8-9 lbs and feels like his legs are very tight. He has been taking his lasix and metolazone. He has been taking eliquis for DVT. Says he has decreased the amount of salt he has been taking but still has some canned soup occasionally. Also reports chest pain that happened this morning while at rest. It was substernal and pressure like in nature and lasted for about 7 minutes. Denies recent fever, chills, abd pain.   In the ED BP 115/75, pulse 75, afebrile, RR 14, 98% O2. Labs show sodium 133, glucose 122, anion gap 17, Hgb 12.2. BNP 266. Lactic acidosis of 2.3. UA negative. COVID pending. CXR unremarkable. EKG shows sinus tach, which later normalized to the 70s. HE was given IV lasix 80 mg in  the ED.   Past Medical History:  Diagnosis Date  . Acute respiratory failure with hypoxia ([place])   . Anemia of chronic disease   . Benign essential HTN   . Chronic heart failure with preserved EF 05/30/2019   Echo 07/2019: EF 55-60, GR 1 DD  . Chronic pain syndrome   . Cirrhosis of liver without ascites ([place])   . Debility 03/13/2019  . Drug induced constipation   . Dyspnea   . Fatty liver disease, nonalcoholic   . GIB (gastrointestinal bleeding) 03/04/2019  . Hyperlipidemia 01/24/2019  . Hypertension 01/24/2019  . Hypoalbuminemia due to protein-calorie malnutrition ([place])   . Hypokalemia   . Hyponatremia   . Hypotension due to drugs   . Insomnia 01/24/2019  . Lumbar disc herniation 01/24/2019  . Morbid obesity with BMI of 40.0-44.9, adult ([place]) 05/30/2019  . Normocytic anemia 06/08/2019  . Peripheral edema   . Pulmonary emboli ([place]) 06/08/2019  . Pulmonary embolism ([place]) 06/08/2019  . Shock ([place])   . Thrombocytopenia ([employer])     Past Surgical History:  Procedure Laterality Date  . ESOPHAGOGASTRODUODENOSCOPY (EGD) WITH PROPOFOL N/A 03/05/2019   Procedure: ESOPHAGOGASTRODUODENOSCOPY (EGD) WITH PROPOFOL;  Surgeon: Doran Stabler, MD;  Location: [place];  Service: Gastroenterology;  Laterality: N/A;  Large Blood Clot Removed  . ESOPHAGOGASTRODUODENOSCOPY (EGD) WITH PROPOFOL N/A 03/06/2019   Procedure: ESOPHAGOGASTRODUODENOSCOPY (EGD) WITH PROPOFOL;  Surgeon: Thornton Park, MD;  Location: [place];  Service: Gastroenterology;  Laterality: N/A;  . HEMORROIDECTOMY    .  RIGHT HEART CATH N/A 08/09/2019   Procedure: RIGHT HEART CATH;  Surgeon: Larey Dresser, MD;  Location: [place] [facility];  Service: Cardiovascular;  Laterality: N/A;     Medications Prior to Admission: Prior to Admission medications   Medication Sig Start Date End Date Taking? Authorizing Provider  acetaminophen (TYLENOL) 325 MG tablet Take 2 tablets (650 mg total) by mouth every 4 (four) hours as  needed for fever or mild pain. 03/23/19   Angiulli, Lavon Paganini, PA-C  apixaban (ELIQUIS) 5 MG TABS tablet Take 1 tablet (5 mg total) by mouth 2 (two) times daily. 08/12/19   Strader, Fransisco Hertz, PA-C  Buprenorphine HCl-Naloxone HCl 8-2 MG FILM Place 1 tablet under the tongue in the morning and at bedtime.     [provider]  diphenhydrAMINE (BENADRYL) 25 MG tablet Take 25 mg by mouth every 6 (six) hours as needed for itching or allergies.    [provider]  furosemide (LASIX) 80 MG tablet Take 1 tablet (80 mg total) by mouth 2 (two) times daily. 08/12/19 08/11/20  Richardson Dopp T, PA-C  gabapentin (NEURONTIN) 800 MG tablet Take 1 tablet (800 mg total) by mouth 3 (three) times daily. 03/23/19   Angiulli, Lavon Paganini, PA-C  levofloxacin (LEVAQUIN) 250 MG tablet Take 1 tablet (250 mg total) by mouth daily. Take 1 tablet by mouth daily for 3 days 08/18/19   Tobb, Kardie, DO  lisinopril (ZESTRIL) 5 MG tablet Take 1 tablet (5 mg total) by mouth daily. 08/12/19 08/11/20  Richardson Dopp T, PA-C  metolazone (ZAROXOLYN) 5 MG tablet Take 1 tablet (5 mg total) by mouth daily as needed (weight gain of 2-3 lbs in 1 day or greater). 08/12/19 08/11/20  Richardson Dopp T, PA-C  pantoprazole (PROTONIX) 40 MG tablet Take 40 mg by mouth 2 (two) times daily.    [provider]  polyethylene glycol (MIRALAX / GLYCOLAX) 17 g packet Take 17 g by mouth daily. Patient taking differently: Take 17 g by mouth daily as needed for mild constipation.  03/23/19   Angiulli, Lavon Paganini, PA-C  potassium chloride SA (KLOR-CON) 20 MEQ tablet Take 2 tablets (40 mEq total) by mouth 2 (two) times daily. 08/12/19 08/11/20  Richardson Dopp T, PA-C  pravastatin (PRAVACHOL) 20 MG tablet Take 1 tablet (20 mg total) by mouth daily. 03/23/19   Angiulli, Lavon Paganini, PA-C  VENTOLIN HFA 108 (90 Base) MCG/ACT inhaler Inhale 1 puff into the lungs every 4 (four) hours as needed for shortness of breath. 03/23/19   Angiulli, Lavon Paganini, PA-C  zolpidem (AMBIEN) 10  MG tablet Take 10 mg by mouth at bedtime as needed for sleep.  05/19/19   [provider]     Allergies:    Allergies  Allergen Reactions  . Penicillins     Did it involve swelling of the face/tongue/throat, SOB, or low BP? N/A Did it involve sudden or severe rash/hives, skin peeling, or any reaction on the inside of your mouth or nose? N/A Did you need to seek medical attention at a hospital or doctor's office?N/A When did it last happen?N/A If all above answers are "NO", may proceed with cephalosporin use.    Social History:   Social History   Socioeconomic History  . Marital status: Unknown    Spouse name: Not on file  . Number of children: Not on file  . Years of education: Not on file  . Highest education level: Not on file  Occupational History  . Not  on file  Tobacco Use  . Smoking status: Never Smoker  . Smokeless tobacco: Never Used  Substance and Sexual Activity  . Alcohol use: Never  . Drug use: Not Currently    Types: Marijuana    Comment: Smoked for 30 years  . Sexual activity: Not on file  Other Topics Concern  . Not on file  Social History Narrative  . Not on file   Social Determinants of Health   Financial Resource Strain:   . Difficulty of Paying Living Expenses:   Food Insecurity:   . Worried About Charity fundraiser in the Last Year:   . Arboriculturist in the Last Year:   Transportation Needs:   . Film/video editor (Medical):   Marland Kitchen Lack of Transportation (Non-Medical):   Physical Activity:   . Days of Exercise per Week:   . Minutes of Exercise per Session:   Stress:   . Feeling of Stress :   Social Connections:   . Frequency of Communication with Friends and Family:   . Frequency of Social Gatherings with Friends and Family:   . Attends Religious Services:   . Active Member of Clubs or Organizations:   . Attends Archivist Meetings:   Marland Kitchen Marital Status:   Intimate Partner Violence:   . Fear of Current or  Ex-Partner:   . Emotionally Abused:   Marland Kitchen Physically Abused:   . Sexually Abused:     Family History:   The patient's family history includes CAD in his maternal grandfather and maternal grandmother; Hyperlipidemia in his mother; Hypertension in his mother; Stroke in his mother.    ROS:  Please see the history of present illness.  All other ROS reviewed and negative.     Physical Exam/Data:   Vitals:   08/23/19 1322 08/23/19 1415 08/23/19 1430 08/23/19 1445  BP:  124/72 107/61 115/75  Pulse:  89 74 75  Resp:  13 12 14   Temp:      TempSrc:      SpO2:  98% 94% 98%  Weight: (!) 170.1 kg     Height: 6' 10"  (2.083 m)      No intake or output data in the 24 hours ending 08/23/19 1641 Last 3 Weights 08/23/2019 08/18/2019 08/12/2019  Weight (lbs) 375 lb 369 lb 381 lb 2.8 oz  Weight (kg) 170.099 kg 167.377 kg 172.9 kg     Body mass index is 39.21 kg/m.  General:  Well nourished, well developed, in no acute distress HEENT: normal Lymph: no adenopathy Neck: no JVD Endocrine:  No thryomegaly Vascular: No carotid bruits; FA pulses 2+ bilaterally without bruits  Cardiac:  normal S1, S2; RRR; no murmur  Lungs:  clear to auscultation bilaterally, no wheezing, rhonchi or rales  Abd: soft, nontender, no hepatomegaly  Ext: 2+ lower leg edema Musculoskeletal:  No deformities, BUE and BLE strength normal and equal Skin: warm and dry  Neuro:  CNs 2-12 intact, no focal abnormalities noted Psych:  Normal affect    EKG:  The ECG that was done 08/23/19 was personally reviewed and demonstrates sinus tachycardia, LAFB, no ST/T wave changes  Relevant CV Studies:  Echo 07/2019  1. Suboptimal image quality for diagnostic purposes.  2. Left ventricular ejection fraction, by estimation, is 55 to 60%. The  left ventricle has normal function. Left ventricular endocardial border  not optimally defined to evaluate regional wall motion. Left ventricular  diastolic parameters are consistent  with Grade  I diastolic  dysfunction (impaired relaxation).  3. Right ventricular systolic function was not well visualized. The right  ventricular size is not well visualized.  4. The mitral valve is grossly normal. No evidence of mitral valve  regurgitation. No evidence of mitral stenosis.  5. The aortic valve was not well visualized. Aortic valve regurgitation  is not visualized. No aortic stenosis is present.   Right heart cath 08/09/19 1. Normal filling pressures.  2. Normal PA pressure.  3. Normal cardiac output.   Laboratory Data:  High Sensitivity Troponin:   Recent Labs  Lab 08/06/19 1728 08/23/19 1355  TROPONINIHS 5 5      Chemistry Recent Labs  Lab 08/18/19 1133 08/23/19 1213  NA 135 133*  K 2.9* 3.6  CL 87* 88*  CO2 28 28  GLUCOSE 107* 122*  BUN 24 18  CREATININE 1.18 1.24  CALCIUM 9.4 9.5  GFRNONAA 65 >60  GFRAA 75 >60  ANIONGAP  --  17*    Recent Labs  Lab 08/23/19 1355  PROT 8.4*  ALBUMIN 3.8  AST 39  ALT 25  ALKPHOS 64  BILITOT 0.9   Hematology Recent Labs  Lab 08/23/19 1213  WBC 9.2  RBC 4.96  HGB 12.2*  HCT 39.7  MCV 80.0  MCH 24.6*  MCHC 30.7  RDW 16.3*  PLT 186   BNP Recent Labs  Lab 08/23/19 1400  BNP 266.3*    DDimer No results for input(s): DDIMER in the last 168 hours.   Radiology/Studies:  DG Chest 2 View  Result Date: 08/23/2019 CLINICAL DATA:  Shortness of breath EXAM: CHEST - 2 VIEW COMPARISON:  08/06/2019 FINDINGS: The heart size and mediastinal contours are within normal limits. Both lungs are clear. The visualized skeletal structures are unremarkable. IMPRESSION: No active cardiopulmonary disease. Electronically Signed   By: Kathreen Devoid   On: 08/23/2019 12:58   VAS [place] LOWER EXTREMITY VENOUS (DVT) (ONLY [facility] & [facility])  Result Date: 08/23/2019  Lower Venous DVTStudy Indications: Swelling.  Risk Factors: DVT. Limitations: Body habitus, poor ultrasound/tissue interface and patient pain tolerance. Comparison Study: 06/08/2019 -  Left Popliteal DVT Performing Technologist: Oliver Hum RVT  Examination Guidelines: A complete evaluation includes B-mode imaging, spectral Doppler, color Doppler, and power Doppler as needed of all accessible portions of each vessel. Bilateral testing is considered an integral part of a complete examination. Limited examinations for reoccurring indications may be performed as noted. The reflux portion of the exam is performed with the patient in reverse Trendelenburg.  +---------+---------------+---------+-----------+----------+--------------+ RIGHT    CompressibilityPhasicitySpontaneityPropertiesThrombus Aging +---------+---------------+---------+-----------+----------+--------------+ CFV      Full           Yes      Yes                                 +---------+---------------+---------+-----------+----------+--------------+ SFJ      Full                                                        +---------+---------------+---------+-----------+----------+--------------+ FV Prox  Full                                                        +---------+---------------+---------+-----------+----------+--------------+  FV Mid   Full                                                        +---------+---------------+---------+-----------+----------+--------------+ FV DistalFull                                                        +---------+---------------+---------+-----------+----------+--------------+ PFV      Full                                                        +---------+---------------+---------+-----------+----------+--------------+ POP      Full           Yes      Yes                                 +---------+---------------+---------+-----------+----------+--------------+ PTV      Full                                                        +---------+---------------+---------+-----------+----------+--------------+ PERO     Full                                                         +---------+---------------+---------+-----------+----------+--------------+   +---------+---------------+---------+-----------+----------+--------------+ LEFT     CompressibilityPhasicitySpontaneityPropertiesThrombus Aging +---------+---------------+---------+-----------+----------+--------------+ CFV      Full           Yes      Yes                                 +---------+---------------+---------+-----------+----------+--------------+ SFJ      Full                                                        +---------+---------------+---------+-----------+----------+--------------+ FV Prox  Full                                                        +---------+---------------+---------+-----------+----------+--------------+ FV Mid   Full                                                        +---------+---------------+---------+-----------+----------+--------------+  FV DistalFull                                                        +---------+---------------+---------+-----------+----------+--------------+ PFV      Full                                                        +---------+---------------+---------+-----------+----------+--------------+ POP      Full           Yes      Yes                                 +---------+---------------+---------+-----------+----------+--------------+ PTV      Full                                                        +---------+---------------+---------+-----------+----------+--------------+ PERO     Full                                                        +---------+---------------+---------+-----------+----------+--------------+     Summary: RIGHT: - There is no evidence of deep vein thrombosis in the lower extremity. However, portions of this examination were limited- see technologist comments above.  - No cystic structure found in the popliteal  fossa.  LEFT: - There is no evidence of deep vein thrombosis in the lower extremity. However, portions of this examination were limited- see technologist comments above.  - No cystic structure found in the popliteal fossa.  *See table(s) above for measurements and observations.    Preliminary    {  Assessment and Plan:    Acute Right Heart Failure - Patient presents with worsening lower leg edema for the last week with weight gain. He was recently admitted with heart failure in April 2021 - He has been taking his lasix 80 mgBID and metolazone but has not seemed to help - He occasionally eats canned foods but has been trying to lower salt intake - RHC on last admission showed normal filling pressures - Recent echo showed EF 55-60% with G1DD - In the ER BNP up to 266  - On exam he has lower leg edema, given IV lasix 80 in the ED - Start IV lasix 80 mg BID - monitor strict I/Os, daily weights, and creatinine with diuresis - MD to see  Cellulitis - prescribed levaquin for possible lower leg cellulitis - WBC 9.2  Chest pain - HS troponin negative - continue to monitor  HTN - lisinopril 31m daily - IV lasix  DVT - Eliquis - Recent LE [place] for DVT  Lactic acidosis - up to 2.3 - Blood cultures collected - WBC normal   For questions or updates, please contact [facility] Please consult [email or web address] for contact info under  Signed, Cadence Ninfa Meeker, PA-C  08/23/2019 4:41 PM   Patient seen and examined with Cadence Further PA-C.  Agree as above, with the following exceptions and changes as noted below.    This is a 64 year old gentleman with a history of hypertension, hyperlipidemia, morbid obesity, [name], herniated disc with chronic pain maintained on Suboxone, pulmonary embolism, who presents with shortness of breath and leg swelling, likely exacerbation of heart failure with preserved ejection fraction due to right heart failure and dietary  indiscretions.  Patient states that he feels terrible, he feels his legs are going to explode from edema. Gen: NAD, CV: RRR, no murmurs, Lungs: Bilateral crackles, Abd: soft, Extrem: Lower extremities are warm to the touch right greater than left, erythematous right anterior shin.  Bilateral shiny skin with taut 3+ edema neuro/Psych: alert and oriented x 3, normal mood and affect. All available labs, radiology testing, previous records reviewed.  The patient presents again with decompensation of heart failure.  He feels short of breath with lower extremity edema.  He also is concerned with neck discomfort, headache, and an erythematous right lower extremity.  He also notes that the only thing he can afford is canned foods which is making it very challenging for him to stick to a heart failure appropriate diet.  He will need inpatient diuresis and we are happy to assist with this.  Would request medicine admission for management of noncardiac etiologies of shortness of breath.  Continue Lasix 80 mg IV twice daily, patient appears to be having a good response to diuretic therapy.  He is requesting food, no current indication for further procedures given recent catheterization.  Elouise Munroe, MD 08/23/19 7:05 PM

## 2019-08-23 NOTE — Telephone Encounter (Signed)
Spoke with Home health nurse who states that the pt has a regular heart rate of 126, swelling in his legs, diaphoretic, shortness of breath, and dizziness. O2 96% and no fever. Advised to call 911 and let EMS evaluate him and call us back if he refuses to go to the ED.

## 2019-08-23 NOTE — ED Provider Notes (Signed)
4:30 PM-he presents to the ED for evaluation shortness of breath, directed here by his cardiologist.  He has history of CHF.  Patient has been seen by cardiology, who recommended hospitalization for treatment of fluid overload (weight up 8 to 9 pounds), and they will be involved as a Optometrist.  Cardiologist  (DR. Margaretann Loveless) concern was that the patient is being noncompliant with dietary restrictions.  She recommends that he stay on the 80 mg IV dose of Lasix.  Patient is currently being treated with Levaquin for lower extremity cellulitis.  Patient has nonspecific elevation of lactate, which trended down during observation in the emergency department.  7:25 PM-he is alert, calm and comfortable sitting in a chair.  Right lower leg has mild swelling greater than left, and slight erythema.  No acute drainage, bleeding or fluctuance.  No respiratory distress.  7:23 PM-Consult complete with hospitalist. Patient case explained and discussed.  He agrees to admit patient for further evaluation and treatment. Call ended at 7:44 PM       [name], MD 08/25/19 [phone]

## 2019-08-23 NOTE — Progress Notes (Signed)
Bilateral lower extremity venous duplex has been completed. Preliminary results can be found in CV Proc through chart review.  Results were given to Dr. Gilford Raid.  08/23/19 4:05 PM Charles Gray RVT

## 2019-08-23 NOTE — Telephone Encounter (Signed)
Thank you :)

## 2019-08-23 NOTE — ED Notes (Signed)
Attempted to call report , nurse in a patient's room and requested that she call me back

## 2019-08-23 NOTE — ED Notes (Signed)
Pt requesting his night time meds an pain medication. Pharmacy notified.

## 2019-08-23 NOTE — ED Provider Notes (Signed)
[facility] EMERGENCY DEPARTMENT Provider Note   CSN: [id number] Arrival date & time: 08/23/19  1149     History Chief Complaint  Patient presents with  . Shortness of Breath    Charles Gray is a 64 y.o. male.  Pt presents to the ED today with sob.  He feels like it might be his CHF.  He said he's had an increase in his leg swelling.  Pt called Dr. Harriet Masson (cards) who recommended he come in for eval.  He has been compliant with his meds, but did not take it today.  Pt also has a hx of dvt and PE.  He's been taking his Eliquis.  Pt thinks he had a fever yesterday.  He's been on levaquin for a cellulitis to his right leg.  He said it does not seem to be getting better.  Pt has gained about 5 pounds in the last week.  Pt has not been vaccinated against Covid.  He said he's worried about getting blood clots with the vaccine.  I tried to educate him about the value of a vaccine.  Pt denies any known covid exposures.        Past Medical History:  Diagnosis Date  . Acute respiratory failure with hypoxia ([place])   . Anemia of chronic disease   . Benign essential HTN   . Chronic heart failure with preserved EF 05/30/2019   Echo 07/2019: EF 55-60, GR 1 DD  . Chronic pain syndrome   . Cirrhosis of liver without ascites ([place])   . Debility 03/13/2019  . Drug induced constipation   . Dyspnea   . Fatty liver disease, nonalcoholic   . GIB (gastrointestinal bleeding) 03/04/2019  . Hyperlipidemia 01/24/2019  . Hypertension 01/24/2019  . Hypoalbuminemia due to protein-calorie malnutrition ([place])   . Hypokalemia   . Hyponatremia   . Hypotension due to drugs   . Insomnia 01/24/2019  . Lumbar disc herniation 01/24/2019  . Morbid obesity with BMI of 40.0-44.9, adult ([place]) 05/30/2019  . Normocytic anemia 06/08/2019  . Peripheral edema   . Pulmonary emboli ([place]) 06/08/2019  . Pulmonary embolism ([place]) 06/08/2019  . Shock ([place])   . Thrombocytopenia [facility])     Patient Active  Problem List   Diagnosis Date Noted  . History of pulmonary embolism 08/12/2019  . Atrial tachycardia ([place]) 08/12/2019  . Occasional tremors 08/12/2019  . OSA (obstructive sleep apnea) 08/12/2019  . AKI (acute kidney injury) ([place]) 08/08/2019  . Acute on chronic right-sided heart failure ([place]) 08/06/2019  . Pulmonary embolism ([place]) 06/08/2019  . Normocytic anemia 06/08/2019  . Pulmonary emboli ([place]) 06/08/2019  . Chronic heart failure with preserved ejection fraction ([place]) 05/30/2019  . Morbid obesity with BMI of 40.0-44.9, adult ([place]) 05/30/2019  . Hyponatremia   . Benign essential HTN   . Drug induced constipation   . Peripheral edema   . Hypotension due to drugs   . Hypoalbuminemia due to protein-calorie malnutrition ([place])   . Hypokalemia   . Thrombocytopenia ([place])   . Anemia of chronic disease   . Cirrhosis of liver without ascites ([place])   . Chronic pain syndrome   . Debility 03/13/2019  . Acute respiratory failure with hypoxia ([place])   . Shock ([place])   . Dyspnea   . GIB (gastrointestinal bleeding) 03/04/2019  . Hyperlipidemia 01/24/2019  . Hypertension 01/24/2019  . Insomnia 01/24/2019    Past Surgical History:  Procedure Laterality Date  . ESOPHAGOGASTRODUODENOSCOPY (EGD) WITH PROPOFOL N/A 03/05/2019  Procedure: ESOPHAGOGASTRODUODENOSCOPY (EGD) WITH PROPOFOL;  Surgeon: Doran Stabler, MD;  Location: [facility];  Service: Gastroenterology;  Laterality: N/A;  Large Blood Clot Removed  . ESOPHAGOGASTRODUODENOSCOPY (EGD) WITH PROPOFOL N/A 03/06/2019   Procedure: ESOPHAGOGASTRODUODENOSCOPY (EGD) WITH PROPOFOL;  Surgeon: Thornton Park, MD;  Location: [facility];  Service: Gastroenterology;  Laterality: N/A;  . HEMORROIDECTOMY    . RIGHT HEART CATH N/A 08/09/2019   Procedure: RIGHT HEART CATH;  Surgeon: Larey Dresser, MD;  Location: [facility];  Service: Cardiovascular;  Laterality: N/A;       Family History  Problem Relation Age of Onset  .  Hyperlipidemia Mother   . Hypertension Mother   . Stroke Mother   . CAD Maternal Grandmother   . CAD Maternal Grandfather     Social History   Tobacco Use  . Smoking status: Never Smoker  . Smokeless tobacco: Never Used  Substance Use Topics  . Alcohol use: Never  . Drug use: Not Currently    Types: Marijuana    Comment: Smoked for 30 years    Home Medications Prior to Admission medications   Medication Sig Start Date End Date Taking? Authorizing Provider  acetaminophen (TYLENOL) 325 MG tablet Take 2 tablets (650 mg total) by mouth every 4 (four) hours as needed for fever or mild pain. 03/23/19   Angiulli, Lavon Paganini, PA-C  apixaban (ELIQUIS) 5 MG TABS tablet Take 1 tablet (5 mg total) by mouth 2 (two) times daily. 08/12/19   Strader, Fransisco Hertz, PA-C  Buprenorphine HCl-Naloxone HCl 8-2 MG FILM Place 1 tablet under the tongue in the morning and at bedtime.     [provider]  diphenhydrAMINE (BENADRYL) 25 MG tablet Take 25 mg by mouth every 6 (six) hours as needed for itching or allergies.    [provider]  furosemide (LASIX) 80 MG tablet Take 1 tablet (80 mg total) by mouth 2 (two) times daily. 08/12/19 08/11/20  Richardson Dopp T, PA-C  gabapentin (NEURONTIN) 800 MG tablet Take 1 tablet (800 mg total) by mouth 3 (three) times daily. 03/23/19   Angiulli, Lavon Paganini, PA-C  levofloxacin (LEVAQUIN) 250 MG tablet Take 1 tablet (250 mg total) by mouth daily. Take 1 tablet by mouth daily for 3 days 08/18/19   Tobb, Kardie, DO  lisinopril (ZESTRIL) 5 MG tablet Take 1 tablet (5 mg total) by mouth daily. 08/12/19 08/11/20  Richardson Dopp T, PA-C  metolazone (ZAROXOLYN) 5 MG tablet Take 1 tablet (5 mg total) by mouth daily as needed (weight gain of 2-3 lbs in 1 day or greater). 08/12/19 08/11/20  Richardson Dopp T, PA-C  pantoprazole (PROTONIX) 40 MG tablet Take 40 mg by mouth 2 (two) times daily.    [provider]  polyethylene glycol (MIRALAX / GLYCOLAX) 17 g packet Take 17 g by mouth  daily. Patient taking differently: Take 17 g by mouth daily as needed for mild constipation.  03/23/19   Angiulli, Lavon Paganini, PA-C  potassium chloride SA (KLOR-CON) 20 MEQ tablet Take 2 tablets (40 mEq total) by mouth 2 (two) times daily. 08/12/19 08/11/20  Richardson Dopp T, PA-C  pravastatin (PRAVACHOL) 20 MG tablet Take 1 tablet (20 mg total) by mouth daily. 03/23/19   Angiulli, Lavon Paganini, PA-C  VENTOLIN HFA 108 (90 Base) MCG/ACT inhaler Inhale 1 puff into the lungs every 4 (four) hours as needed for shortness of breath. 03/23/19   Angiulli, Lavon Paganini, PA-C  zolpidem (AMBIEN) 10 MG tablet Take 10 mg by  mouth at bedtime as needed for sleep.  05/19/19   [provider]    Allergies    Penicillins  Review of Systems   Review of Systems  Respiratory: Positive for shortness of breath.   Cardiovascular: Positive for leg swelling.  All other systems reviewed and are negative.   Physical Exam Updated Vital Signs BP 115/75   Pulse 75   Temp 98.4 F (36.9 C) (Oral)   Resp 14   Ht 6' 10"  (2.083 m)   Wt (!) 170.1 kg   SpO2 98%   BMI 39.21 kg/m   Physical Exam Vitals and nursing note reviewed.  Constitutional:      Appearance: He is well-developed. He is obese.  HENT:     Head: Normocephalic and atraumatic.     Mouth/Throat:     Mouth: Mucous membranes are moist.     Pharynx: Oropharynx is clear.  Eyes:     Extraocular Movements: Extraocular movements intact.     Pupils: Pupils are equal, round, and reactive to light.  Cardiovascular:     Rate and Rhythm: Regular rhythm. Tachycardia present.  Pulmonary:     Effort: Tachypnea present.  Abdominal:     General: Bowel sounds are normal.     Palpations: Abdomen is soft.  Musculoskeletal:        General: Normal range of motion.     Cervical back: Normal range of motion and neck supple.     Right lower leg: Edema present.     Left lower leg: Edema present.     Comments: Cellulitis to right leg  Skin:    General: Skin is warm.      Capillary Refill: Capillary refill takes less than 2 seconds.  Neurological:     General: No focal deficit present.     Mental Status: He is alert and oriented to person, place, and time.  Psychiatric:        Mood and Affect: Mood normal.        Behavior: Behavior normal.     ED Results / Procedures / Treatments   Labs (all labs ordered are listed, but only abnormal results are displayed) Labs Reviewed  BASIC METABOLIC PANEL - Abnormal; Notable for the following components:      Result Value   Sodium 133 (*)    Chloride 88 (*)    Glucose, Bld 122 (*)    Anion gap 17 (*)    All other components within normal limits  CBC - Abnormal; Notable for the following components:   Hemoglobin 12.2 (*)    MCH 24.6 (*)    RDW 16.3 (*)    All other components within normal limits  HEPATIC FUNCTION PANEL - Abnormal; Notable for the following components:   Total Protein 8.4 (*)    All other components within normal limits  BRAIN NATRIURETIC PEPTIDE - Abnormal; Notable for the following components:   B Natriuretic Peptide 266.3 (*)    All other components within normal limits  URINALYSIS, ROUTINE W REFLEX MICROSCOPIC - Abnormal; Notable for the following components:   Color, Urine STRAW (*)    All other components within normal limits  LACTIC ACID, PLASMA - Abnormal; Notable for the following components:   Lactic Acid, Venous 2.3 (*)    All other components within normal limits  CULTURE, BLOOD (ROUTINE X 2)  CULTURE, BLOOD (ROUTINE X 2)  SARS CORONAVIRUS 2 BY RT PCR (HOSPITAL ORDER, [facility])  MAGNESIUM  LACTIC  ACID, PLASMA  TROPONIN I (HIGH SENSITIVITY)  TROPONIN I (HIGH SENSITIVITY)    EKG EKG Interpretation  Date/Time:  Wednesday Aug 23 2019 11:57:38 EDT Ventricular Rate:  123 PR Interval:  138 QRS Duration: 100 QT Interval:  322 QTC Calculation: 460 R Axis:   -68 Text Interpretation: Sinus tachycardia Left anterior fascicular block Septal  infarct , age undetermined Abnormal ECG Since last tracing rate faster Confirmed by Isla Pence [phone]) on 08/23/2019 12:22:05 PM   Radiology DG Chest 2 View  Result Date: 08/23/2019 CLINICAL DATA:  Shortness of breath EXAM: CHEST - 2 VIEW COMPARISON:  08/06/2019 FINDINGS: The heart size and mediastinal contours are within normal limits. Both lungs are clear. The visualized skeletal structures are unremarkable. IMPRESSION: No active cardiopulmonary disease. Electronically Signed   By: Kathreen Devoid   On: 08/23/2019 12:58   VAS [place] LOWER EXTREMITY VENOUS (DVT) (ONLY [facility] & [facility])  Result Date: 08/23/2019  Lower Venous DVTStudy Indications: Swelling.  Risk Factors: DVT. Limitations: Body habitus, poor ultrasound/tissue interface and patient pain tolerance. Comparison Study: 06/08/2019 - Left Popliteal DVT Performing Technologist: Oliver Hum RVT  Examination Guidelines: A complete evaluation includes B-mode imaging, spectral Doppler, color Doppler, and power Doppler as needed of all accessible portions of each vessel. Bilateral testing is considered an integral part of a complete examination. Limited examinations for reoccurring indications may be performed as noted. The reflux portion of the exam is performed with the patient in reverse Trendelenburg.  +---------+---------------+---------+-----------+----------+--------------+ RIGHT    CompressibilityPhasicitySpontaneityPropertiesThrombus Aging +---------+---------------+---------+-----------+----------+--------------+ CFV      Full           Yes      Yes                                 +---------+---------------+---------+-----------+----------+--------------+ SFJ      Full                                                        +---------+---------------+---------+-----------+----------+--------------+ FV Prox  Full                                                         +---------+---------------+---------+-----------+----------+--------------+ FV Mid   Full                                                        +---------+---------------+---------+-----------+----------+--------------+ FV DistalFull                                                        +---------+---------------+---------+-----------+----------+--------------+ PFV      Full                                                        +---------+---------------+---------+-----------+----------+--------------+  POP      Full           Yes      Yes                                 +---------+---------------+---------+-----------+----------+--------------+ PTV      Full                                                        +---------+---------------+---------+-----------+----------+--------------+ PERO     Full                                                        +---------+---------------+---------+-----------+----------+--------------+   +---------+---------------+---------+-----------+----------+--------------+ LEFT     CompressibilityPhasicitySpontaneityPropertiesThrombus Aging +---------+---------------+---------+-----------+----------+--------------+ CFV      Full           Yes      Yes                                 +---------+---------------+---------+-----------+----------+--------------+ SFJ      Full                                                        +---------+---------------+---------+-----------+----------+--------------+ FV Prox  Full                                                        +---------+---------------+---------+-----------+----------+--------------+ FV Mid   Full                                                        +---------+---------------+---------+-----------+----------+--------------+ FV DistalFull                                                         +---------+---------------+---------+-----------+----------+--------------+ PFV      Full                                                        +---------+---------------+---------+-----------+----------+--------------+ POP      Full           Yes      Yes                                 +---------+---------------+---------+-----------+----------+--------------+  PTV      Full                                                        +---------+---------------+---------+-----------+----------+--------------+ PERO     Full                                                        +---------+---------------+---------+-----------+----------+--------------+     Summary: RIGHT: - There is no evidence of deep vein thrombosis in the lower extremity. However, portions of this examination were limited- see technologist comments above.  - No cystic structure found in the popliteal fossa.  LEFT: - There is no evidence of deep vein thrombosis in the lower extremity. However, portions of this examination were limited- see technologist comments above.  - No cystic structure found in the popliteal fossa.  *See table(s) above for measurements and observations.    Preliminary     Procedures Procedures (including critical care time)  Medications Ordered in ED Medications  sodium chloride flush (NS) 0.9 % injection 3 mL (3 mLs Intravenous Not Given 08/23/19 1527)  furosemide (LASIX) injection 80 mg (80 mg Intravenous Given 08/23/19 1444)    ED Course  I have reviewed the triage vital signs and the nursing notes.  Pertinent labs & imaging results that were available during my care of the patient were reviewed by me and considered in my medical decision making (see chart for details).    MDM Rules/Calculators/A&P                      Initial HR is tachycardic.  Now, it is back to nl.  VAS US shows no DVT.  Pt has been compliant with his Eliquis.  Doubt PE.    Pt still sob with mvmt.  He was  d/w Cards who recommends admission to medicine.  Covid neg.  Final Clinical Impression(s) / ED Diagnoses Final diagnoses:  Acute on chronic congestive heart failure, unspecified heart failure type [facility])    Rx / DC Orders ED Discharge Orders    None       Isla Pence, MD 08/28/19 1516

## 2019-08-24 DIAGNOSIS — E782 Mixed hyperlipidemia: Secondary | ICD-10-CM | POA: Diagnosis present

## 2019-08-24 DIAGNOSIS — I5021 Acute systolic (congestive) heart failure: Secondary | ICD-10-CM | POA: Diagnosis not present

## 2019-08-24 DIAGNOSIS — R42 Dizziness and giddiness: Secondary | ICD-10-CM | POA: Diagnosis not present

## 2019-08-24 DIAGNOSIS — Z86718 Personal history of other venous thrombosis and embolism: Secondary | ICD-10-CM | POA: Diagnosis not present

## 2019-08-24 DIAGNOSIS — E876 Hypokalemia: Secondary | ICD-10-CM | POA: Diagnosis not present

## 2019-08-24 DIAGNOSIS — I5033 Acute on chronic diastolic (congestive) heart failure: Secondary | ICD-10-CM | POA: Diagnosis not present

## 2019-08-24 DIAGNOSIS — E871 Hypo-osmolality and hyponatremia: Secondary | ICD-10-CM | POA: Diagnosis present

## 2019-08-24 DIAGNOSIS — I509 Heart failure, unspecified: Secondary | ICD-10-CM | POA: Diagnosis not present

## 2019-08-24 DIAGNOSIS — Z6839 Body mass index (BMI) 39.0-39.9, adult: Secondary | ICD-10-CM | POA: Diagnosis not present

## 2019-08-24 DIAGNOSIS — G894 Chronic pain syndrome: Secondary | ICD-10-CM | POA: Diagnosis present

## 2019-08-24 DIAGNOSIS — Z8249 Family history of ischemic heart disease and other diseases of the circulatory system: Secondary | ICD-10-CM | POA: Diagnosis not present

## 2019-08-24 DIAGNOSIS — Z7901 Long term (current) use of anticoagulants: Secondary | ICD-10-CM | POA: Diagnosis not present

## 2019-08-24 DIAGNOSIS — R238 Other skin changes: Secondary | ICD-10-CM | POA: Diagnosis not present

## 2019-08-24 DIAGNOSIS — R000 Tachycardia, unspecified: Secondary | ICD-10-CM | POA: Diagnosis present

## 2019-08-24 DIAGNOSIS — G4700 Insomnia, unspecified: Secondary | ICD-10-CM | POA: Diagnosis present

## 2019-08-24 DIAGNOSIS — E872 Acidosis: Secondary | ICD-10-CM

## 2019-08-24 DIAGNOSIS — I878 Other specified disorders of veins: Secondary | ICD-10-CM | POA: Diagnosis present

## 2019-08-24 DIAGNOSIS — Z86711 Personal history of pulmonary embolism: Secondary | ICD-10-CM | POA: Diagnosis not present

## 2019-08-24 DIAGNOSIS — I50813 Acute on chronic right heart failure: Secondary | ICD-10-CM | POA: Diagnosis not present

## 2019-08-24 DIAGNOSIS — R0789 Other chest pain: Secondary | ICD-10-CM | POA: Diagnosis present

## 2019-08-24 DIAGNOSIS — K7460 Unspecified cirrhosis of liver: Secondary | ICD-10-CM | POA: Diagnosis present

## 2019-08-24 DIAGNOSIS — Z79899 Other long term (current) drug therapy: Secondary | ICD-10-CM | POA: Diagnosis not present

## 2019-08-24 DIAGNOSIS — Z881 Allergy status to other antibiotic agents status: Secondary | ICD-10-CM | POA: Diagnosis not present

## 2019-08-24 DIAGNOSIS — R079 Chest pain, unspecified: Secondary | ICD-10-CM | POA: Diagnosis not present

## 2019-08-24 DIAGNOSIS — I5043 Acute on chronic combined systolic (congestive) and diastolic (congestive) heart failure: Secondary | ICD-10-CM | POA: Diagnosis present

## 2019-08-24 DIAGNOSIS — M7989 Other specified soft tissue disorders: Secondary | ICD-10-CM | POA: Diagnosis not present

## 2019-08-24 DIAGNOSIS — I951 Orthostatic hypotension: Secondary | ICD-10-CM | POA: Diagnosis not present

## 2019-08-24 DIAGNOSIS — Z20822 Contact with and (suspected) exposure to covid-19: Secondary | ICD-10-CM | POA: Diagnosis present

## 2019-08-24 DIAGNOSIS — I110 Hypertensive heart disease with heart failure: Secondary | ICD-10-CM | POA: Diagnosis present

## 2019-08-24 DIAGNOSIS — Z83438 Family history of other disorder of lipoprotein metabolism and other lipidemia: Secondary | ICD-10-CM | POA: Diagnosis not present

## 2019-08-24 HISTORY — DX: Acute systolic (congestive) heart failure: I50.21

## 2019-08-24 LAB — BASIC METABOLIC PANEL
Anion gap: 14 (ref 5–15)
BUN: 22 mg/dL (ref 8–23)
CO2: 30 mmol/L (ref 22–32)
Calcium: 9.6 mg/dL (ref 8.9–10.3)
Chloride: 89 mmol/L — ABNORMAL LOW (ref 98–111)
Creatinine, Ser: 1.42 mg/dL — ABNORMAL HIGH (ref 0.61–1.24)
GFR calc Af Amer: >60 mL/min (ref >60)
GFR calc non Af Amer: 52 mL/min — ABNORMAL LOW (ref >60)
Glucose, Bld: 148 mg/dL — ABNORMAL HIGH (ref 70–99)
Potassium: 3.1 mmol/L — ABNORMAL LOW (ref 3.5–5.1)
Sodium: 133 mmol/L — ABNORMAL LOW (ref 135–145)

## 2019-08-24 LAB — CBC WITH DIFFERENTIAL/PLATELET
Abs Immature Granulocytes: 0.02 10*3/uL (ref 0.00–0.07)
Basophils Absolute: 0 10*3/uL (ref 0.0–0.1)
Basophils Relative: 0 %
Eosinophils Absolute: 0.1 10*3/uL (ref 0.0–0.5)
Eosinophils Relative: 1 %
HCT: 39.1 % (ref 39.0–52.0)
Hemoglobin: 12.3 g/dL — ABNORMAL LOW (ref 13.0–17.0)
Immature Granulocytes: 0 %
Lymphocytes Relative: 12 %
Lymphs Abs: 1.3 10*3/uL (ref 0.7–4.0)
MCH: 25.1 pg — ABNORMAL LOW (ref 26.0–34.0)
MCHC: 31.5 g/dL (ref 30.0–36.0)
MCV: 79.6 fL — ABNORMAL LOW (ref 80.0–100.0)
Monocytes Absolute: 0.7 10*3/uL (ref 0.1–1.0)
Monocytes Relative: 7 %
Neutro Abs: 8.7 10*3/uL — ABNORMAL HIGH (ref 1.7–7.7)
Neutrophils Relative %: 80 %
Platelets: 179 10*3/uL (ref 150–400)
RBC: 4.91 MIL/uL (ref 4.22–5.81)
RDW: 16.3 % — ABNORMAL HIGH (ref 11.5–15.5)
WBC: 10.9 10*3/uL — ABNORMAL HIGH (ref 4.0–10.5)
nRBC: 0 % (ref 0.0–0.2)

## 2019-08-24 LAB — C-REACTIVE PROTEIN: CRP: 1 mg/dL — ABNORMAL HIGH (ref <1.0)

## 2019-08-24 LAB — LACTIC ACID, PLASMA: Lactic Acid, Venous: 2.3 mmol/L (ref 0.5–1.9)

## 2019-08-24 LAB — MAGNESIUM: Magnesium: 2.1 mg/dL (ref 1.7–2.4)

## 2019-08-24 LAB — TROPONIN I (HIGH SENSITIVITY): Troponin I (High Sensitivity): 5 ng/L (ref <18)

## 2019-08-24 LAB — GLUCOSE, CAPILLARY: Glucose-Capillary: 120 mg/dL — ABNORMAL HIGH (ref 70–99)

## 2019-08-24 MED ORDER — POTASSIUM CHLORIDE CRYS ER 20 MEQ PO TBCR
20.0000 meq | EXTENDED_RELEASE_TABLET | Freq: Once | ORAL | Status: AC
  Administered 2019-08-24: 20 meq via ORAL
  Filled 2019-08-24: qty 1

## 2019-08-24 MED ORDER — POTASSIUM CHLORIDE CRYS ER 20 MEQ PO TBCR
40.0000 meq | EXTENDED_RELEASE_TABLET | ORAL | Status: AC
  Administered 2019-08-24 (×2): 40 meq via ORAL
  Filled 2019-08-24 (×2): qty 2

## 2019-08-24 MED ORDER — ZOLPIDEM TARTRATE 5 MG PO TABS: 5.0000 mg | ORAL_TABLET | Freq: Every evening | ORAL | Status: DC | PRN

## 2019-08-24 MED ORDER — ALUM & MAG HYDROXIDE-SIMETH 200-200-20 MG/5ML PO SUSP
30.0000 mL | Freq: Once | ORAL | Status: AC
  Administered 2019-08-24: 30 mL via ORAL
  Filled 2019-08-24: qty 30

## 2019-08-24 MED ORDER — ZOLPIDEM TARTRATE 5 MG PO TABS
10.0000 mg | ORAL_TABLET | Freq: Every evening | ORAL | Status: DC | PRN
  Administered 2019-08-24 – 2019-08-29 (×7): 10 mg via ORAL
  Filled 2019-08-24 (×7): qty 2

## 2019-08-24 MED ORDER — LIDOCAINE VISCOUS HCL 2 % MT SOLN
15.0000 mL | Freq: Once | OROMUCOSAL | Status: AC
  Administered 2019-08-24: 15 mL via ORAL
  Filled 2019-08-24: qty 15

## 2019-08-24 NOTE — Progress Notes (Signed)
Patient complained of 5/10 stabbing chest pain.  MD Avon Gully notified, [place] PA Notified EKG obtained. New orders for GI cocktail Will continue to monitor

## 2019-08-24 NOTE — Progress Notes (Signed)
BP 92/62, patient complained of dizziness. Per PA hold PM dose lasix and merolazone.

## 2019-08-24 NOTE — Progress Notes (Signed)
PROGRESS NOTE    Charles Gray  SEG:[id number] DOB: 07-03-1955 DOA: 08/23/2019 PCP: Enid Skeens., MD   Brief Narrative:  64 year old male with past medical history of diastolic congestive heart failure, chronic pain syndrome, cirrhosis with identified hypertensive gastropathy on EGD 02/2019, essential hypertension, pulmonary embolism diagnosed 2/21 on Eliquis therapy, obesity who presents to [facility] emergency department with complaints of shortness of breath.  Patient recently hospitalized here for newly diagnosed congestive heart failure from late April and discharged in early May.  Patient last followed up with Dr. Harriet Masson with Cardiology 5/7 where patient was placed on a short course of Levaquin due to increasing redness of the bilateral lower extremities and concern for cellulitis.  Patient indicates progressively worsening shortness of breath with exertion, lower extremity edema and orthopnea.  Patient admits to poor compliance with dietary recommendations and lifestyle recommendations as outlined at previous hospitalization. Upon evaluation in the emergency department, patient received x1 dose of 80 mg of IV Lasix.  Troponin was performed and was unremarkable.  BNP was found to be elevated at 266 compared to 13.72 weeks ago.  COVID-19 testing was performed and was found to be negative.  Patient was also found to be exhibiting a mild lactic acidosis of 2.3.   Assessment & Plan:   Active Problems:   Mixed hyperlipidemia   Chronic pain syndrome   Hyponatremia   Class 2 severe obesity due to excess calories with serious comorbidity and body mass index (BMI) of 39.0 to 39.9 in adult [facility])   History of pulmonary embolism   Acute on chronic diastolic CHF (congestive heart failure) (HCC)   Redness and swelling of lower leg   Chest pain   Lactic acidosis   Acute on chronic diastolic CHF (congestive heart failure), POA -Symptoms of orthopnea, dyspnea with exertion, edema, weight  gain due to remarkably poor dietary compliance with newly diagnosed heart failure  - Continue metolazone 2.5 mg IV twice daily in addition to Lasix 80 mg IV twice daily. -Continue to monitor for hypoxia -Follow creatinine, daily weights, I's and O's quite closely  Intake/Output Summary (Last 24 hours) at 08/24/2019 1517 Last data filed at 08/24/2019 7371 Gross per 24 hour  Intake 342 ml  Output 3255 ml  Net -2913 ml   Atypical chest pain, unlikely ACS Rule out GI or pleuritic etiology - Repeat episode this afternoon, EKG unremarkable for ST elevations or depressions - GI cocktail prescribed, appears to have had moderate improvement in patient's symptoms -Likely GI in nature given above, ACS ruled out  Lactic Acidosis ongoing -Likely in the setting of poor perfusion secondary to above -Continue to diurese  Mixed hyperlipidemia -Continue home statin  Chronic pain syndrome - Continue home regimen of Suboxone - This regimen has been confirmed via the controlled substance database at admission.  Hyponatremia, hypervolemic in nature -Continue aggressive diuresis as above  History of pulmonary embolism - Recently diagnosed pulmonary embolism and February 2021 - Continue home Eliquis   Redness and swelling of lower leg - Likely secondary to above, unlikely infectious in etiology -Continue to follow clinically, follow cultures  Class 2 severe obesity due to excess calories with serious comorbidity and body mass index (BMI) of 39.0 to 39.9 in adult [facility]) -Lengthy counseling at bedside daily on caloric restriction and regular physical activity   DVT prophylaxis: Eliquis Code Status: Full Family Communication: None present  Status is: Transition to inpatient given ongoing required IV diuresis, close monitoring, severe heart failure exacerbation with increased  risk of morbidity mortality  Dispo: The patient is from: Home              Anticipated d/c is to: Home               Anticipated d/c date is: 48 to 72 hours              Patient currently not medically stable for discharge due to ongoing need for IV diuretics, close monitoring, telemetry in the setting of heart failure exacerbation as above  Consultants:   None  Procedures:   None  Antimicrobials:  None indicated  Subjective: No acute issues or events overnight, patient shortness of breath, orthopnea, chest pain all appear to be resolving since admission.  Otherwise denies nausea, vomiting, diarrhea, headache, fevers, chills.  Objective: Vitals:   08/23/19 2143 08/23/19 2300 08/24/19 0544 08/24/19 0600  BP: (!) 142/79 133/88 (!) 144/81   Pulse: 89 94 77 77  Resp: 14 16 (!) 21   Temp: 98.1 F (36.7 C) (!) 97.5 F (36.4 C) 97.8 F (36.6 C)   TempSrc: Oral Oral Oral   SpO2: 99% 97% 100% 98%  Weight:  (!) 168.1 kg (!) 168.1 kg (!) 167.5 kg  Height:  6' 10"  (2.083 m)      Intake/Output Summary (Last 24 hours) at 08/24/2019 0728 Last data filed at 08/24/2019 3846 Gross per 24 hour  Intake 342 ml  Output 3255 ml  Net -2913 ml   Filed Weights   08/23/19 2300 08/24/19 0544 08/24/19 0600  Weight: (!) 168.1 kg (!) 168.1 kg (!) 167.5 kg    Examination:  General:  Pleasantly resting in bed, No acute distress.  Sitting upright with nonlabored breathing HEENT:  Normocephalic atraumatic.  Sclerae nonicteric, noninjected.  Extraocular movements intact bilaterally. Neck:  Without mass or deformity.  Trachea is midline. Lungs: Bibasilar rales without overt rhonchi or wheeze, nonlabored breathing with no accessory muscle use Heart:  Regular rate and rhythm.  Without murmurs, rubs, or gallops. Abdomen:  Soft, obese, nontender, nondistended.  Without guarding or rebound. Extremities: 3+ pitting edema bilaterally.  Without cyanosis, clubbing, or obvious deformity. Vascular:  Dorsalis pedis and posterior tibial pulses palpable bilaterally. Skin:  Warm and dry, no erythema, no ulcerations.   Data  Reviewed: I have personally reviewed following labs and imaging studies  CBC: Recent Labs  Lab 08/23/19 1213 08/24/19 0059  WBC 9.2 10.9*  NEUTROABS  --  8.7*  HGB 12.2* 12.3*  HCT 39.7 39.1  MCV 80.0 79.6*  PLT 186 659   Basic Metabolic Panel: Recent Labs  Lab 08/18/19 1133 08/23/19 1213 08/23/19 1355 08/24/19 0059  NA 135 133*  --  133*  K 2.9* 3.6  --  3.1*  CL 87* 88*  --  89*  CO2 28 28  --  30  GLUCOSE 107* 122*  --  148*  BUN 24 18  --  22  CREATININE 1.18 1.24  --  1.42*  CALCIUM 9.4 9.5  --  9.6  MG 2.0  --  2.1 2.1   GFR: Estimated Creatinine Clearance: 95.9 mL/min (A) (by C-G formula based on SCr of 1.42 mg/dL (H)). Liver Function Tests: Recent Labs  Lab 08/23/19 1355  AST 39  ALT 25  ALKPHOS 64  BILITOT 0.9  PROT 8.4*  ALBUMIN 3.8   No results for input(s): LIPASE, AMYLASE in the last 168 hours. No results for input(s): AMMONIA in the last 168 hours. Coagulation Profile: No results for  input(s): INR, PROTIME in the last 168 hours. Cardiac Enzymes: No results for input(s): CKTOTAL, CKMB, CKMBINDEX, TROPONINI in the last 168 hours. BNP (last 3 results) Recent Labs    01/24/19 1121 02/27/19 1106  PROBNP 52 37   HbA1C: No results for input(s): HGBA1C in the last 72 hours. CBG: No results for input(s): GLUCAP in the last 168 hours. Lipid Profile: No results for input(s): CHOL, HDL, LDLCALC, TRIG, CHOLHDL, LDLDIRECT in the last 72 hours. Thyroid Function Tests: No results for input(s): TSH, T4TOTAL, FREET4, T3FREE, THYROIDAB in the last 72 hours. Anemia Panel: No results for input(s): VITAMINB12, FOLATE, FERRITIN, TIBC, IRON, RETICCTPCT in the last 72 hours. Sepsis Labs: Recent Labs  Lab 08/23/19 1248 08/23/19 1627 08/24/19 0059  LATICACIDVEN 2.3* 1.8 2.3*    Recent Results (from the past 240 hour(s))  SARS Coronavirus 2 by RT PCR (hospital order, performed in [facility] hospital lab) Nasopharyngeal Nasopharyngeal Swab     Status:  None   Collection Time: 08/23/19  3:41 PM   Specimen: Nasopharyngeal Swab  Result Value Ref Range Status   SARS Coronavirus 2 NEGATIVE NEGATIVE Final    Comment: (NOTE) SARS-CoV-2 target nucleic acids are NOT DETECTED. The SARS-CoV-2 RNA is generally detectable in upper and lower respiratory specimens during the acute phase of infection. The lowest concentration of SARS-CoV-2 viral copies this assay can detect is 250 copies / mL. A negative result does not preclude SARS-CoV-2 infection and should not be used as the sole basis for treatment or other patient management decisions.  A negative result may occur with improper specimen collection / handling, submission of specimen other than nasopharyngeal swab, presence of viral mutation(s) within the areas targeted by this assay, and inadequate number of viral copies (<250 copies / mL). A negative result must be combined with clinical observations, patient history, and epidemiological information. Fact Sheet for Patients:   StrictlyIdeas.no Fact Sheet for Healthcare Providers: BankingDealers.co.za This test is not yet approved or cleared  by the [place] FDA and has been authorized for detection and/or diagnosis of SARS-CoV-2 by FDA under an Emergency Use Authorization (EUA).  This EUA will remain in effect (meaning this test can be used) for the duration of the COVID-19 declaration under Section 564(b)(1) of the Act, 21 U.S.C. section 360bbb-3(b)(1), unless the authorization is terminated or revoked sooner. Performed at [facility], [place] [address] [place], [place] [address]     Radiology Studies: DG Chest 2 View  Result Date: 08/23/2019 CLINICAL DATA:  Shortness of breath EXAM: CHEST - 2 VIEW COMPARISON:  08/06/2019 FINDINGS: The heart size and mediastinal contours are within normal limits. Both lungs are clear. The visualized skeletal structures are unremarkable. IMPRESSION:  No active cardiopulmonary disease. Electronically Signed   By: Kathreen Devoid   On: 08/23/2019 12:58   VAS [place] LOWER EXTREMITY VENOUS (DVT) (ONLY [facility] & [facility])  Result Date: 08/23/2019  Lower Venous DVTStudy Indications: Swelling.  Risk Factors: DVT. Limitations: Body habitus, poor ultrasound/tissue interface and patient pain tolerance. Comparison Study: 06/08/2019 - Left Popliteal DVT Performing Technologist: Oliver Hum RVT  Examination Guidelines: A complete evaluation includes B-mode imaging, spectral Doppler, color Doppler, and power Doppler as needed of all accessible portions of each vessel. Bilateral testing is considered an integral part of a complete examination. Limited examinations for reoccurring indications may be performed as noted. The reflux portion of the exam is performed with the patient in reverse Trendelenburg.  +---------+---------------+---------+-----------+----------+--------------+ RIGHT    CompressibilityPhasicitySpontaneityPropertiesThrombus Aging +---------+---------------+---------+-----------+----------+--------------+ CFV  Full           Yes      Yes                                 +---------+---------------+---------+-----------+----------+--------------+ SFJ      Full                                                        +---------+---------------+---------+-----------+----------+--------------+ FV Prox  Full                                                        +---------+---------------+---------+-----------+----------+--------------+ FV Mid   Full                                                        +---------+---------------+---------+-----------+----------+--------------+ FV DistalFull                                                        +---------+---------------+---------+-----------+----------+--------------+ PFV      Full                                                         +---------+---------------+---------+-----------+----------+--------------+ POP      Full           Yes      Yes                                 +---------+---------------+---------+-----------+----------+--------------+ PTV      Full                                                        +---------+---------------+---------+-----------+----------+--------------+ PERO     Full                                                        +---------+---------------+---------+-----------+----------+--------------+   +---------+---------------+---------+-----------+----------+--------------+ LEFT     CompressibilityPhasicitySpontaneityPropertiesThrombus Aging +---------+---------------+---------+-----------+----------+--------------+ CFV      Full           Yes      Yes                                 +---------+---------------+---------+-----------+----------+--------------+  SFJ      Full                                                        +---------+---------------+---------+-----------+----------+--------------+ FV Prox  Full                                                        +---------+---------------+---------+-----------+----------+--------------+ FV Mid   Full                                                        +---------+---------------+---------+-----------+----------+--------------+ FV DistalFull                                                        +---------+---------------+---------+-----------+----------+--------------+ PFV      Full                                                        +---------+---------------+---------+-----------+----------+--------------+ POP      Full           Yes      Yes                                 +---------+---------------+---------+-----------+----------+--------------+ PTV      Full                                                         +---------+---------------+---------+-----------+----------+--------------+ PERO                                                  Not visualized +---------+---------------+---------+-----------+----------+--------------+     Summary: RIGHT: - There is no evidence of deep vein thrombosis in the lower extremity. However, portions of this examination were limited- see technologist comments above.  - No cystic structure found in the popliteal fossa.  LEFT: - There is no evidence of deep vein thrombosis in the lower extremity. However, portions of this examination were limited- see technologist comments above.  - No cystic structure found in the popliteal fossa.  *See table(s) above for measurements and observations. Electronically signed by Curt Jews MD on 08/23/2019 at 7:28:18 PM.    Final     Scheduled Meds: . apixaban  5 mg Oral BID  . buprenorphine-naloxone  1 tablet Sublingual BID  .  furosemide  80 mg Intravenous BID  . gabapentin  800 mg Oral TID  . lisinopril  5 mg Oral Daily  . metolazone  2.5 mg Oral BID  . pantoprazole  40 mg Oral BID  . pravastatin  20 mg Oral Daily  . sodium chloride flush  3 mL Intravenous Once  . sodium chloride flush  3 mL Intravenous Q12H   Continuous Infusions: . sodium chloride       LOS: 0 days   Time spent: 62mn  Leslieanne Cobarrubias C Montay Vanvoorhis, DO [facility]  If 7PM-7AM, please contact night-coverage [email or web address]  08/24/2019, 7:28 AM

## 2019-08-24 NOTE — Progress Notes (Signed)
Progress Note  Patient Name: Charles Gray Date of Encounter: 08/24/2019  Primary Cardiologist: Berniece Salines, DO   Subjective   Feeling slightly better with diuresis  Inpatient Medications    Scheduled Meds: . apixaban  5 mg Oral BID  . buprenorphine-naloxone  1 tablet Sublingual BID  . furosemide  80 mg Intravenous BID  . gabapentin  800 mg Oral TID  . lisinopril  5 mg Oral Daily  . metolazone  2.5 mg Oral BID  . pantoprazole  40 mg Oral BID  . pravastatin  20 mg Oral Daily  . sodium chloride flush  3 mL Intravenous Once  . sodium chloride flush  3 mL Intravenous Q12H   Continuous Infusions: . sodium chloride     PRN Meds: sodium chloride, acetaminophen, albuterol, ondansetron (ZOFRAN) IV, polyethylene glycol, sodium chloride flush, zolpidem   Vital Signs    Vitals:   08/24/19 1555 08/24/19 1625 08/24/19 1850 08/24/19 2051  BP: 125/71 108/75 92/62 103/68  Pulse: 94 79 87 81  Resp:    18  Temp:    98 F (36.7 C)  TempSrc:    Oral  SpO2: 98% 95% 96% 96%  Weight:      Height:        Intake/Output Summary (Last 24 hours) at 08/24/2019 2128 Last data filed at 08/24/2019 2045 Gross per 24 hour  Intake 1002 ml  Output 3080 ml  Net -2078 ml   Last 3 Weights 08/24/2019 08/24/2019 08/23/2019  Weight (lbs) 369 lb 3.2 oz 370 lb 8 oz 370 lb 8 oz  Weight (kg) 167.468 kg 168.058 kg 168.058 kg      Telemetry    SR - Personally Reviewed  ECG    No new- Personally Reviewed  Physical Exam   GEN: No acute distress.   Neck: No JVD Cardiac: RRR, no murmurs, rubs, or gallops.  Respiratory: Clear to auscultation bilaterally. GI: Soft, nontender, mildly distended but not tense MS: 2+ edema; No deformity. Neuro:  Nonfocal  Psych: Normal affect   Labs    High Sensitivity Troponin:   Recent Labs  Lab 08/06/19 1728 08/23/19 1355 08/23/19 1627 08/24/19 0059  TROPONINIHS 5 5 4 5       Chemistry Recent Labs  Lab 08/18/19 1133 08/23/19 1213 08/23/19 1355  08/24/19 0059  NA 135 133*  --  133*  K 2.9* 3.6  --  3.1*  CL 87* 88*  --  89*  CO2 28 28  --  30  GLUCOSE 107* 122*  --  148*  BUN 24 18  --  22  CREATININE 1.18 1.24  --  1.42*  CALCIUM 9.4 9.5  --  9.6  PROT  --   --  8.4*  --   ALBUMIN  --   --  3.8  --   AST  --   --  39  --   ALT  --   --  25  --   ALKPHOS  --   --  64  --   BILITOT  --   --  0.9  --   GFRNONAA 65 >60  --  52*  GFRAA 75 >60  --  >60  ANIONGAP  --  17*  --  14     Hematology Recent Labs  Lab 08/23/19 1213 08/24/19 0059  WBC 9.2 10.9*  RBC 4.96 4.91  HGB 12.2* 12.3*  HCT 39.7 39.1  MCV 80.0 79.6*  MCH 24.6* 25.1*  MCHC 30.7 31.5  RDW 16.3* 16.3*  PLT 186 179    BNP Recent Labs  Lab 08/23/19 1400  BNP 266.3*     DDimer No results for input(s): DDIMER in the last 168 hours.   Radiology    DG Chest 2 View  Result Date: 08/23/2019 CLINICAL DATA:  Shortness of breath EXAM: CHEST - 2 VIEW COMPARISON:  08/06/2019 FINDINGS: The heart size and mediastinal contours are within normal limits. Both lungs are clear. The visualized skeletal structures are unremarkable. IMPRESSION: No active cardiopulmonary disease. Electronically Signed   By: Kathreen Devoid   On: 08/23/2019 12:58   VAS [place] LOWER EXTREMITY VENOUS (DVT) (ONLY [facility] & [facility])  Result Date: 08/23/2019  Lower Venous DVTStudy Indications: Swelling.  Risk Factors: DVT. Limitations: Body habitus, poor ultrasound/tissue interface and patient pain tolerance. Comparison Study: 06/08/2019 - Left Popliteal DVT Performing Technologist: Oliver Hum RVT  Examination Guidelines: A complete evaluation includes B-mode imaging, spectral Doppler, color Doppler, and power Doppler as needed of all accessible portions of each vessel. Bilateral testing is considered an integral part of a complete examination. Limited examinations for reoccurring indications may be performed as noted. The reflux portion of the exam is performed with the patient in reverse Trendelenburg.   +---------+---------------+---------+-----------+----------+--------------+ RIGHT    CompressibilityPhasicitySpontaneityPropertiesThrombus Aging +---------+---------------+---------+-----------+----------+--------------+ CFV      Full           Yes      Yes                                 +---------+---------------+---------+-----------+----------+--------------+ SFJ      Full                                                        +---------+---------------+---------+-----------+----------+--------------+ FV Prox  Full                                                        +---------+---------------+---------+-----------+----------+--------------+ FV Mid   Full                                                        +---------+---------------+---------+-----------+----------+--------------+ FV DistalFull                                                        +---------+---------------+---------+-----------+----------+--------------+ PFV      Full                                                        +---------+---------------+---------+-----------+----------+--------------+ POP      Full           Yes  Yes                                 +---------+---------------+---------+-----------+----------+--------------+ PTV      Full                                                        +---------+---------------+---------+-----------+----------+--------------+ PERO     Full                                                        +---------+---------------+---------+-----------+----------+--------------+   +---------+---------------+---------+-----------+----------+--------------+ LEFT     CompressibilityPhasicitySpontaneityPropertiesThrombus Aging +---------+---------------+---------+-----------+----------+--------------+ CFV      Full           Yes      Yes                                  +---------+---------------+---------+-----------+----------+--------------+ SFJ      Full                                                        +---------+---------------+---------+-----------+----------+--------------+ FV Prox  Full                                                        +---------+---------------+---------+-----------+----------+--------------+ FV Mid   Full                                                        +---------+---------------+---------+-----------+----------+--------------+ FV DistalFull                                                        +---------+---------------+---------+-----------+----------+--------------+ PFV      Full                                                        +---------+---------------+---------+-----------+----------+--------------+ POP      Full           Yes      Yes                                 +---------+---------------+---------+-----------+----------+--------------+ PTV      Full                                                        +---------+---------------+---------+-----------+----------+--------------+  PERO                                                  Not visualized +---------+---------------+---------+-----------+----------+--------------+     Summary: RIGHT: - There is no evidence of deep vein thrombosis in the lower extremity. However, portions of this examination were limited- see technologist comments above.  - No cystic structure found in the popliteal fossa.  LEFT: - There is no evidence of deep vein thrombosis in the lower extremity. However, portions of this examination were limited- see technologist comments above.  - No cystic structure found in the popliteal fossa.  *See table(s) above for measurements and observations. Electronically signed by Curt Jews MD on 08/23/2019 at 7:28:18 PM.    Final     Cardiac Studies    Patient Profile      Assessment & Plan    He  presents with acute right heart heart failure likely secondary to pulmonary disease and dietary indiscretions.  The patient states he has trouble affording anything but can food, but has tried to add frozen meals to his regimen that are "better for him".  I suspect he will have difficulty managing his diuresis at home.  I have discussed his case with his primary cardiologist, and I wonder if he would be a good candidate for a cardio mems device which may assist him in home adjustment of diuretics prior to needing hospitalization.  Acute right heart heart Failure -  IV lasix 80 mg BID, primary service added metolazone - monitor strict I/Os, daily weights, and creatinine with diuresis -He had 2.2 L of urine output yesterday and was 1900 mL net negative.  Weight at last hospital discharge was 173 kg.  He is already below that, but feels he is grossly volume overloaded. - would consider HF consult for cardiomems consideration  Chest pain - HS troponin negative, responded to GI coctail - continue to monitor  HTN - lisinopril 18m daily - IV lasix  DVT - Eliquis - Recent LE [place] for DVT      For questions or updates, please contact [facility] Please consult [email or web address] for contact info under        Signed, GElouise Munroe MD  08/24/2019, 9:28 PM

## 2019-08-24 NOTE — Progress Notes (Signed)
CRITICAL VALUE ALERT  Critical value received:  Lactic acid=2.3;K+=3.1  Date of notification:  08/24/19  Time of notification:  0238  Critical value read back:Yes.    Nurse who received alert:  Stephanie Coup RN  MD notified (1st page):  Dr.Opyd  Time of first page:  0239  MD notified (2nd page):  Time of second page:  Responding MD: Dr.Opyd  Time MD responded:  [phone]

## 2019-08-25 DIAGNOSIS — I50813 Acute on chronic right heart failure: Secondary | ICD-10-CM

## 2019-08-25 LAB — CBC
HCT: 36.9 % — ABNORMAL LOW (ref 39.0–52.0)
Hemoglobin: 11.3 g/dL — ABNORMAL LOW (ref 13.0–17.0)
MCH: 24.7 pg — ABNORMAL LOW (ref 26.0–34.0)
MCHC: 30.6 g/dL (ref 30.0–36.0)
MCV: 80.6 fL (ref 80.0–100.0)
Platelets: 165 10*3/uL (ref 150–400)
RBC: 4.58 MIL/uL (ref 4.22–5.81)
RDW: 16.1 % — ABNORMAL HIGH (ref 11.5–15.5)
WBC: 9 10*3/uL (ref 4.0–10.5)
nRBC: 0 % (ref 0.0–0.2)

## 2019-08-25 LAB — BASIC METABOLIC PANEL
Anion gap: 14 (ref 5–15)
BUN: 28 mg/dL — ABNORMAL HIGH (ref 8–23)
CO2: 30 mmol/L (ref 22–32)
Calcium: 9.4 mg/dL (ref 8.9–10.3)
Chloride: 89 mmol/L — ABNORMAL LOW (ref 98–111)
Creatinine, Ser: 1.22 mg/dL (ref 0.61–1.24)
GFR calc Af Amer: >60 mL/min (ref >60)
GFR calc non Af Amer: >60 mL/min (ref >60)
Glucose, Bld: 107 mg/dL — ABNORMAL HIGH (ref 70–99)
Potassium: 3.4 mmol/L — ABNORMAL LOW (ref 3.5–5.1)
Sodium: 133 mmol/L — ABNORMAL LOW (ref 135–145)

## 2019-08-25 NOTE — Consult Note (Signed)
   [facility] CM Inpatient Consult   08/25/2019  [name] 1955/07/14 [id number]   Patient chart has been reviewed for readmissions less than 30 days and for high risk score, 29%, for unplanned readmissions.  Patient assessed for community [facility] follow up needs.  Patient still progressing. Hospital liaison will continue to follow for progression and disposition plans and engage patient for potential [facility] Care Management outpatient services if appropriate.  Of note, [facility] Care Management services does not replace or interfere with any services that are arranged by inpatient case management or social work.  Netta Cedars, MSN, [facility] Liaison Nurse Mobile Phone [phone]  Toll free office [phone]

## 2019-08-25 NOTE — Plan of Care (Signed)

## 2019-08-25 NOTE — Progress Notes (Addendum)
PROGRESS NOTE    Charles Gray  HFS:[id number] DOB: 11-Dec-1955 DOA: 08/23/2019 PCP: Enid Skeens., MD   Brief Narrative:  64 year old male with past medical history of diastolic congestive heart failure, chronic pain syndrome, cirrhosis with identified hypertensive gastropathy on EGD 02/2019, essential hypertension, pulmonary embolism diagnosed 2/21 on Eliquis therapy, obesity who presents to [facility] emergency department with complaints of shortness of breath.  Patient recently hospitalized here for newly diagnosed congestive heart failure from late April and discharged in early May.  Patient last followed up with Dr. Harriet Masson with Cardiology 5/7 where patient was placed on a short course of Levaquin due to increasing redness of the bilateral lower extremities and concern for cellulitis.  Patient indicates progressively worsening shortness of breath with exertion, lower extremity edema and orthopnea.  Patient admits to poor compliance with dietary recommendations and lifestyle recommendations as outlined at previous hospitalization. Upon evaluation in the emergency department, patient received x1 dose of 80 mg of IV Lasix.  Troponin was performed and was unremarkable.  BNP was found to be elevated at 266 compared to 13.72 weeks ago.  COVID-19 testing was performed and was found to be negative.  Patient was also found to be exhibiting a mild lactic acidosis of 2.3.   Assessment & Plan:   Active Problems:   Mixed hyperlipidemia   Chronic pain syndrome   Hyponatremia   Class 2 severe obesity due to excess calories with serious comorbidity and body mass index (BMI) of 39.0 to 39.9 in adult [facility])   History of pulmonary embolism   Acute on chronic diastolic CHF (congestive heart failure) (HCC)   Redness and swelling of lower leg   Chest pain   Lactic acidosis   Heart failure, systolic, acute (HCC)   Acute on chronic congestive heart failure (HCC)   Acute on chronic diastolic CHF  (congestive heart failure), POA -Symptoms of orthopnea, dyspnea with exertion, edema, weight gain due to remarkably poor dietary compliance with newly diagnosed heart failure  - Cardiology following appreciate insight recommendations - Continue Lasix 80 mg IV twice daily.  Holding metolazone per cardiology - Continue to monitor for hypoxia - Transient episode of symptomatic hypotension likely orthostatic in nature yesterday, now resolved - Follow creatinine, daily weights, I's and O's quite closely  Intake/Output Summary (Last 24 hours) at 08/25/2019 0731 Last data filed at 08/25/2019 0320 Gross per 24 hour  Intake 660 ml  Output 2050 ml  Net -1390 ml   Atypical chest pain -likely epigastric/GI in nature Unlikely ACS - No recurrent episodes, previously noted EKG unremarkable for ST elevations or depressions -Patient responded appropriately to GI cocktail -recommend PPI at discharge  Lactic Acidosis -Likely in the setting of poor perfusion secondary to above -Continue to diurese as tolerated  Mixed hyperlipidemia -Continue home statin  Chronic pain syndrome - Continue home regimen of Suboxone - This regimen has been confirmed via the controlled substance database at admission.  Hyponatremia, hypervolemic in nature -Continue aggressive diuresis as above  History of pulmonary embolism - Recently diagnosed pulmonary embolism and February 2021 - Continue home Eliquis   Redness and swelling of lower leg - Likely secondary to above, unlikely infectious in etiology -Continue to follow clinically, follow cultures  Class 2 severe obesity due to excess calories with serious comorbidity and body mass index (BMI) of 39.0 to 39.9 in adult [facility]) -Lengthy counseling at bedside daily on caloric restriction and regular physical activity   DVT prophylaxis: Eliquis Code Status: Full Family Communication:  None present  Status is: Transition to inpatient given ongoing required IV  diuresis, close monitoring, severe heart failure exacerbation with increased risk of morbidity mortality  Dispo: The patient is from: Home              Anticipated d/c is to: Home              Anticipated d/c date is: 24-48 hours              Patient currently not medically stable for discharge due to ongoing need for IV diuretics, close monitoring, telemetry in the setting of heart failure exacerbation as above  Consultants:   None  Procedures:   None  Antimicrobials:  None indicated  Subjective: No acute issues or events overnight, patient shortness of breath, orthopnea, chest pain all appear to be resolving since admission.  Otherwise denies nausea, vomiting, diarrhea, headache, fevers, chills.  Objective: Vitals:   08/24/19 1625 08/24/19 1850 08/24/19 2051 08/25/19 0336  BP: 108/75 92/62 103/68 105/62  Pulse: 79 87 81 78  Resp:   18 17  Temp:   98 F (36.7 C) 97.9 F (36.6 C)  TempSrc:   Oral Oral  SpO2: 95% 96% 96% 100%  Weight:    (!) 168.7 kg  Height:        Intake/Output Summary (Last 24 hours) at 08/25/2019 0731 Last data filed at 08/25/2019 0320 Gross per 24 hour  Intake 660 ml  Output 2050 ml  Net -1390 ml   Filed Weights   08/24/19 0544 08/24/19 0600 08/25/19 0336  Weight: (!) 168.1 kg (!) 167.5 kg (!) 168.7 kg    Examination:  General:  Pleasantly resting in bed, No acute distress.  Sitting upright with nonlabored breathing HEENT:  Normocephalic atraumatic.  Sclerae nonicteric, noninjected.  Extraocular movements intact bilaterally. Neck:  Without mass or deformity.  Trachea is midline. Lungs: Bibasilar rales without overt rhonchi or wheeze, nonlabored breathing with no accessory muscle use Heart:  Regular rate and rhythm.  Without murmurs, rubs, or gallops. Abdomen:  Soft, obese, nontender, nondistended.  Without guarding or rebound. Extremities: 3+ pitting edema bilaterally.  Without cyanosis, clubbing, or obvious deformity. Vascular:  Dorsalis  pedis and posterior tibial pulses palpable bilaterally. Skin:  Warm and dry, no erythema, no ulcerations.   Data Reviewed: I have personally reviewed following labs and imaging studies  CBC: Recent Labs  Lab 08/23/19 1213 08/24/19 0059 08/25/19 0557  WBC 9.2 10.9* 9.0  NEUTROABS  --  8.7*  --   HGB 12.2* 12.3* 11.3*  HCT 39.7 39.1 36.9*  MCV 80.0 79.6* 80.6  PLT 186 179 751   Basic Metabolic Panel: Recent Labs  Lab 08/18/19 1133 08/23/19 1213 08/23/19 1355 08/24/19 0059 08/25/19 0557  NA 135 133*  --  133* 133*  K 2.9* 3.6  --  3.1* 3.4*  CL 87* 88*  --  89* 89*  CO2 28 28  --  30 30  GLUCOSE 107* 122*  --  148* 107*  BUN 24 18  --  22 28*  CREATININE 1.18 1.24  --  1.42* 1.22  CALCIUM 9.4 9.5  --  9.6 9.4  MG 2.0  --  2.1 2.1  --    GFR: Estimated Creatinine Clearance: 112 mL/min (by C-G formula based on SCr of 1.22 mg/dL). Liver Function Tests: Recent Labs  Lab 08/23/19 1355  AST 39  ALT 25  ALKPHOS 64  BILITOT 0.9  PROT 8.4*  ALBUMIN 3.8  No results for input(s): LIPASE, AMYLASE in the last 168 hours. No results for input(s): AMMONIA in the last 168 hours. Coagulation Profile: No results for input(s): INR, PROTIME in the last 168 hours. Cardiac Enzymes: No results for input(s): CKTOTAL, CKMB, CKMBINDEX, TROPONINI in the last 168 hours. BNP (last 3 results) Recent Labs    01/24/19 1121 02/27/19 1106  PROBNP 52 37   HbA1C: No results for input(s): HGBA1C in the last 72 hours. CBG: Recent Labs  Lab 08/24/19 1022  GLUCAP 120*   Lipid Profile: No results for input(s): CHOL, HDL, LDLCALC, TRIG, CHOLHDL, LDLDIRECT in the last 72 hours. Thyroid Function Tests: No results for input(s): TSH, T4TOTAL, FREET4, T3FREE, THYROIDAB in the last 72 hours. Anemia Panel: No results for input(s): VITAMINB12, FOLATE, FERRITIN, TIBC, IRON, RETICCTPCT in the last 72 hours. Sepsis Labs: Recent Labs  Lab 08/23/19 1248 08/23/19 1627 08/24/19 0059    LATICACIDVEN 2.3* 1.8 2.3*    Recent Results (from the past 240 hour(s))  Culture, blood (routine x 2)     Status: None (Preliminary result)   Collection Time: 08/23/19  1:54 PM   Specimen: BLOOD RIGHT FOREARM  Result Value Ref Range Status   Specimen Description BLOOD RIGHT FOREARM  Final   Special Requests   Final    BOTTLES DRAWN AEROBIC AND ANAEROBIC Blood Culture results may not be optimal due to an inadequate volume of blood received in culture bottles   Culture   Final    NO GROWTH < 24 HOURS Performed at [facility], [place] [address], [place], [place] [address]    Report Status PENDING  Incomplete  SARS Coronavirus 2 by RT PCR (hospital order, performed in [facility] hospital lab) Nasopharyngeal Nasopharyngeal Swab     Status: None   Collection Time: 08/23/19  3:41 PM   Specimen: Nasopharyngeal Swab  Result Value Ref Range Status   SARS Coronavirus 2 NEGATIVE NEGATIVE Final    Comment: (NOTE) SARS-CoV-2 target nucleic acids are NOT DETECTED. The SARS-CoV-2 RNA is generally detectable in upper and lower respiratory specimens during the acute phase of infection. The lowest concentration of SARS-CoV-2 viral copies this assay can detect is 250 copies / mL. A negative result does not preclude SARS-CoV-2 infection and should not be used as the sole basis for treatment or other patient management decisions.  A negative result may occur with improper specimen collection / handling, submission of specimen other than nasopharyngeal swab, presence of viral mutation(s) within the areas targeted by this assay, and inadequate number of viral copies (<250 copies / mL). A negative result must be combined with clinical observations, patient history, and epidemiological information. Fact Sheet for Patients:   StrictlyIdeas.no Fact Sheet for Healthcare Providers: BankingDealers.co.za This test is not yet approved or cleared  by the  [place] FDA and has been authorized for detection and/or diagnosis of SARS-CoV-2 by FDA under an Emergency Use Authorization (EUA).  This EUA will remain in effect (meaning this test can be used) for the duration of the COVID-19 declaration under Section 564(b)(1) of the Act, 21 U.S.C. section 360bbb-3(b)(1), unless the authorization is terminated or revoked sooner. Performed at [facility], [place] [address] [place], [place] [address]   Culture, blood (routine x 2)     Status: None (Preliminary result)   Collection Time: 08/23/19  4:27 PM   Specimen: BLOOD  Result Value Ref Range Status   Specimen Description BLOOD SITE NOT SPECIFIED  Final   Special  Requests   Final    BOTTLES DRAWN AEROBIC AND ANAEROBIC Blood Culture adequate volume   Culture   Final    NO GROWTH < 24 HOURS Performed at [facility], [place] [address] [place], [place] [address]    Report Status PENDING  Incomplete    Radiology Studies: DG Chest 2 View  Result Date: 08/23/2019 CLINICAL DATA:  Shortness of breath EXAM: CHEST - 2 VIEW COMPARISON:  08/06/2019 FINDINGS: The heart size and mediastinal contours are within normal limits. Both lungs are clear. The visualized skeletal structures are unremarkable. IMPRESSION: No active cardiopulmonary disease. Electronically Signed   By: Kathreen Devoid   On: 08/23/2019 12:58   VAS [place] LOWER EXTREMITY VENOUS (DVT) (ONLY [facility] & [facility])  Result Date: 08/23/2019  Lower Venous DVTStudy Indications: Swelling.  Risk Factors: DVT. Limitations: Body habitus, poor ultrasound/tissue interface and patient pain tolerance. Comparison Study: 06/08/2019 - Left Popliteal DVT Performing Technologist: Oliver Hum RVT  Examination Guidelines: A complete evaluation includes B-mode imaging, spectral Doppler, color Doppler, and power Doppler as needed of all accessible portions of each vessel. Bilateral testing is considered an integral part of a complete examination. Limited examinations  for reoccurring indications may be performed as noted. The reflux portion of the exam is performed with the patient in reverse Trendelenburg.  +---------+---------------+---------+-----------+----------+--------------+ RIGHT    CompressibilityPhasicitySpontaneityPropertiesThrombus Aging +---------+---------------+---------+-----------+----------+--------------+ CFV      Full           Yes      Yes                                 +---------+---------------+---------+-----------+----------+--------------+ SFJ      Full                                                        +---------+---------------+---------+-----------+----------+--------------+ FV Prox  Full                                                        +---------+---------------+---------+-----------+----------+--------------+ FV Mid   Full                                                        +---------+---------------+---------+-----------+----------+--------------+ FV DistalFull                                                        +---------+---------------+---------+-----------+----------+--------------+ PFV      Full                                                        +---------+---------------+---------+-----------+----------+--------------+ POP  Full           Yes      Yes                                 +---------+---------------+---------+-----------+----------+--------------+ PTV      Full                                                        +---------+---------------+---------+-----------+----------+--------------+ PERO     Full                                                        +---------+---------------+---------+-----------+----------+--------------+   +---------+---------------+---------+-----------+----------+--------------+ LEFT     CompressibilityPhasicitySpontaneityPropertiesThrombus Aging  +---------+---------------+---------+-----------+----------+--------------+ CFV      Full           Yes      Yes                                 +---------+---------------+---------+-----------+----------+--------------+ SFJ      Full                                                        +---------+---------------+---------+-----------+----------+--------------+ FV Prox  Full                                                        +---------+---------------+---------+-----------+----------+--------------+ FV Mid   Full                                                        +---------+---------------+---------+-----------+----------+--------------+ FV DistalFull                                                        +---------+---------------+---------+-----------+----------+--------------+ PFV      Full                                                        +---------+---------------+---------+-----------+----------+--------------+ POP      Full           Yes      Yes                                 +---------+---------------+---------+-----------+----------+--------------+  PTV      Full                                                        +---------+---------------+---------+-----------+----------+--------------+ PERO                                                  Not visualized +---------+---------------+---------+-----------+----------+--------------+     Summary: RIGHT: - There is no evidence of deep vein thrombosis in the lower extremity. However, portions of this examination were limited- see technologist comments above.  - No cystic structure found in the popliteal fossa.  LEFT: - There is no evidence of deep vein thrombosis in the lower extremity. However, portions of this examination were limited- see technologist comments above.  - No cystic structure found in the popliteal fossa.  *See table(s) above for measurements and observations.  Electronically signed by Curt Jews MD on 08/23/2019 at 7:28:18 PM.    Final     Scheduled Meds: . apixaban  5 mg Oral BID  . buprenorphine-naloxone  1 tablet Sublingual BID  . furosemide  80 mg Intravenous BID  . gabapentin  800 mg Oral TID  . lisinopril  5 mg Oral Daily  . metolazone  2.5 mg Oral BID  . pantoprazole  40 mg Oral BID  . pravastatin  20 mg Oral Daily  . sodium chloride flush  3 mL Intravenous Once  . sodium chloride flush  3 mL Intravenous Q12H   Continuous Infusions: . sodium chloride       LOS: 1 day   Time spent: 24mn  Charles Gray C Samaj Wessells, DO [facility]  If 7PM-7AM, please contact night-coverage [email or web address]  08/25/2019, 7:31 AM

## 2019-08-25 NOTE — Progress Notes (Signed)
Progress Note  Patient Name: Charles Gray Date of Encounter: 08/25/2019  Primary Cardiologist: Berniece Salines, DO   Subjective   Not feeling much better yet. Had hypotension and dizziness with diuresis yesterday.   Inpatient Medications    Scheduled Meds: . apixaban  5 mg Oral BID  . buprenorphine-naloxone  1 tablet Sublingual BID  . furosemide  80 mg Intravenous BID  . gabapentin  800 mg Oral TID  . lisinopril  5 mg Oral Daily  . pantoprazole  40 mg Oral BID  . pravastatin  20 mg Oral Daily  . sodium chloride flush  3 mL Intravenous Once  . sodium chloride flush  3 mL Intravenous Q12H   Continuous Infusions: . sodium chloride     PRN Meds: sodium chloride, acetaminophen, albuterol, ondansetron (ZOFRAN) IV, polyethylene glycol, sodium chloride flush, zolpidem   Vital Signs    Vitals:   08/24/19 1625 08/24/19 1850 08/24/19 2051 08/25/19 0336  BP: 108/75 92/62 103/68 105/62  Pulse: 79 87 81 78  Resp:   18 17  Temp:   98 F (36.7 C) 97.9 F (36.6 C)  TempSrc:   Oral Oral  SpO2: 95% 96% 96% 100%  Weight:    (!) 168.7 kg  Height:        Intake/Output Summary (Last 24 hours) at 08/25/2019 0835 Last data filed at 08/25/2019 0800 Gross per 24 hour  Intake 660 ml  Output 2050 ml  Net -1390 ml   Last 3 Weights 08/25/2019 08/24/2019 08/24/2019  Weight (lbs) 371 lb 14.7 oz 369 lb 3.2 oz 370 lb 8 oz  Weight (kg) 168.7 kg 167.468 kg 168.058 kg      Telemetry    Sinus rhythm - Personally Reviewed  ECG    No new - Personally Reviewed  Physical Exam   GEN: No acute distress.   Neck: No JVD Cardiac: RRR, no murmurs, rubs, or gallops.  Respiratory: Clear to auscultation bilaterally. GI: Soft, nontender, non-distended  MS: 3+ edema with venous stasis changes of skin; No deformity. Neuro:  Nonfocal  Psych: Normal affect   Labs    High Sensitivity Troponin:   Recent Labs  Lab 08/06/19 1728 08/23/19 1355 08/23/19 1627 08/24/19 0059  TROPONINIHS 5 5 4 5      Chemistry Recent Labs  Lab 08/23/19 1213 08/23/19 1355 08/24/19 0059 08/25/19 0557  NA 133*  --  133* 133*  K 3.6  --  3.1* 3.4*  CL 88*  --  89* 89*  CO2 28  --  30 30  GLUCOSE 122*  --  148* 107*  BUN 18  --  22 28*  CREATININE 1.24  --  1.42* 1.22  CALCIUM 9.5  --  9.6 9.4  PROT  --  8.4*  --   --   ALBUMIN  --  3.8  --   --   AST  --  39  --   --   ALT  --  25  --   --   ALKPHOS  --  64  --   --   BILITOT  --  0.9  --   --   GFRNONAA >60  --  52* >60  GFRAA >60  --  >60 >60  ANIONGAP 17*  --  14 14     Hematology Recent Labs  Lab 08/23/19 1213 08/24/19 0059 08/25/19 0557  WBC 9.2 10.9* 9.0  RBC 4.96 4.91 4.58  HGB 12.2* 12.3* 11.3*  HCT 39.7 39.1 36.9*  MCV 80.0 79.6* 80.6  MCH 24.6* 25.1* 24.7*  MCHC 30.7 31.5 30.6  RDW 16.3* 16.3* 16.1*  PLT 186 179 165    BNP Recent Labs  Lab 08/23/19 1400  BNP 266.3*     DDimer No results for input(s): DDIMER in the last 168 hours.   Radiology    DG Chest 2 View  Result Date: 08/23/2019 CLINICAL DATA:  Shortness of breath EXAM: CHEST - 2 VIEW COMPARISON:  08/06/2019 FINDINGS: The heart size and mediastinal contours are within normal limits. Both lungs are clear. The visualized skeletal structures are unremarkable. IMPRESSION: No active cardiopulmonary disease. Electronically Signed   By: Kathreen Devoid   On: 08/23/2019 12:58   VAS [place] LOWER EXTREMITY VENOUS (DVT) (ONLY [facility] & [facility])  Result Date: 08/23/2019  Lower Venous DVTStudy Indications: Swelling.  Risk Factors: DVT. Limitations: Body habitus, poor ultrasound/tissue interface and patient pain tolerance. Comparison Study: 06/08/2019 - Left Popliteal DVT Performing Technologist: Oliver Hum RVT  Examination Guidelines: A complete evaluation includes B-mode imaging, spectral Doppler, color Doppler, and power Doppler as needed of all accessible portions of each vessel. Bilateral testing is considered an integral part of a complete examination. Limited examinations  for reoccurring indications may be performed as noted. The reflux portion of the exam is performed with the patient in reverse Trendelenburg.  +---------+---------------+---------+-----------+----------+--------------+ RIGHT    CompressibilityPhasicitySpontaneityPropertiesThrombus Aging +---------+---------------+---------+-----------+----------+--------------+ CFV      Full           Yes      Yes                                 +---------+---------------+---------+-----------+----------+--------------+ SFJ      Full                                                        +---------+---------------+---------+-----------+----------+--------------+ FV Prox  Full                                                        +---------+---------------+---------+-----------+----------+--------------+ FV Mid   Full                                                        +---------+---------------+---------+-----------+----------+--------------+ FV DistalFull                                                        +---------+---------------+---------+-----------+----------+--------------+ PFV      Full                                                        +---------+---------------+---------+-----------+----------+--------------+ POP  Full           Yes      Yes                                 +---------+---------------+---------+-----------+----------+--------------+ PTV      Full                                                        +---------+---------------+---------+-----------+----------+--------------+ PERO     Full                                                        +---------+---------------+---------+-----------+----------+--------------+   +---------+---------------+---------+-----------+----------+--------------+ LEFT     CompressibilityPhasicitySpontaneityPropertiesThrombus Aging  +---------+---------------+---------+-----------+----------+--------------+ CFV      Full           Yes      Yes                                 +---------+---------------+---------+-----------+----------+--------------+ SFJ      Full                                                        +---------+---------------+---------+-----------+----------+--------------+ FV Prox  Full                                                        +---------+---------------+---------+-----------+----------+--------------+ FV Mid   Full                                                        +---------+---------------+---------+-----------+----------+--------------+ FV DistalFull                                                        +---------+---------------+---------+-----------+----------+--------------+ PFV      Full                                                        +---------+---------------+---------+-----------+----------+--------------+ POP      Full           Yes      Yes                                 +---------+---------------+---------+-----------+----------+--------------+  PTV      Full                                                        +---------+---------------+---------+-----------+----------+--------------+ PERO                                                  Not visualized +---------+---------------+---------+-----------+----------+--------------+     Summary: RIGHT: - There is no evidence of deep vein thrombosis in the lower extremity. However, portions of this examination were limited- see technologist comments above.  - No cystic structure found in the popliteal fossa.  LEFT: - There is no evidence of deep vein thrombosis in the lower extremity. However, portions of this examination were limited- see technologist comments above.  - No cystic structure found in the popliteal fossa.  *See table(s) above for measurements and observations.  Electronically signed by Curt Jews MD on 08/23/2019 at 7:28:18 PM.    Final     Cardiac Studies    Patient Profile       Assessment & Plan    He presents with acute right heart heart failure, and appears better compensated today despite continued LE swelling, which is most likely consistent with venous stasis.  The patient states he has trouble affording anything but canned food, but has tried to add frozen meals to his regimen that are "better for him".  I suspect he will have difficulty managing his diuresis at home.  I have discussed his case with his primary cardiologist, and I wonder if he would be a good candidate for a cardio mems device which may assist him in home adjustment of diuretics prior to needing hospitalization. We will arrange   Acute right heart heart Failure -  IV lasix 80 mg BID, d/c metolazone in hospital due to adequate response to lasix. Ok to continue at home as needed. - continue IV lasix today as patient feels it helps with LE swelling, consider transition to torsemide tomorrow given probable right heart failure.  - monitor strict I/Os, daily weights, and creatinine with diuresis -He had 2.6 L of urine output yesterday and was 1820 mL net negative.  Weight at last hospital discharge was 173 kg.  He is already below that, but feels he is grossly volume overloaded. - would consider HF consult for cardiomems consideration - will arrange this as an outpatient per Dr. [name].  Chest pain - HS troponin negative, responded to GI coctail - continue to monitor  HTN - lisinopril 28m daily - had some hypotension with diuresis.  - IV lasix  DVT - Eliquis - Recent LE [place] for DVT      For questions or updates, please contact CLangleyPlease consult [email or web address] for contact info under        Signed, GElouise Munroe MD  08/25/2019, 8:35 AM

## 2019-08-26 DIAGNOSIS — I5021 Acute systolic (congestive) heart failure: Secondary | ICD-10-CM

## 2019-08-26 LAB — CBC
HCT: 39.7 % (ref 39.0–52.0)
Hemoglobin: 12.3 g/dL — ABNORMAL LOW (ref 13.0–17.0)
MCH: 25.1 pg — ABNORMAL LOW (ref 26.0–34.0)
MCHC: 31 g/dL (ref 30.0–36.0)
MCV: 80.9 fL (ref 80.0–100.0)
Platelets: 191 10*3/uL (ref 150–400)
RBC: 4.91 MIL/uL (ref 4.22–5.81)
RDW: 15.9 % — ABNORMAL HIGH (ref 11.5–15.5)
WBC: 9 10*3/uL (ref 4.0–10.5)
nRBC: 0 % (ref 0.0–0.2)

## 2019-08-26 LAB — BASIC METABOLIC PANEL
Anion gap: 13 (ref 5–15)
BUN: 29 mg/dL — ABNORMAL HIGH (ref 8–23)
CO2: 34 mmol/L — ABNORMAL HIGH (ref 22–32)
Calcium: 9.4 mg/dL (ref 8.9–10.3)
Chloride: 85 mmol/L — ABNORMAL LOW (ref 98–111)
Creatinine, Ser: 1.11 mg/dL (ref 0.61–1.24)
GFR calc Af Amer: >60 mL/min (ref >60)
GFR calc non Af Amer: >60 mL/min (ref >60)
Glucose, Bld: 126 mg/dL — ABNORMAL HIGH (ref 70–99)
Potassium: 3.4 mmol/L — ABNORMAL LOW (ref 3.5–5.1)
Sodium: 132 mmol/L — ABNORMAL LOW (ref 135–145)

## 2019-08-26 MED ORDER — POTASSIUM CHLORIDE CRYS ER 20 MEQ PO TBCR
40.0000 meq | EXTENDED_RELEASE_TABLET | ORAL | Status: AC
  Administered 2019-08-26 (×2): 40 meq via ORAL
  Filled 2019-08-26 (×2): qty 2

## 2019-08-26 MED ORDER — TORSEMIDE 20 MG PO TABS
20.0000 mg | ORAL_TABLET | Freq: Two times a day (BID) | ORAL | Status: DC
  Administered 2019-08-26 – 2019-08-30 (×8): 20 mg via ORAL
  Filled 2019-08-26 (×8): qty 1

## 2019-08-26 MED ORDER — SPIRONOLACTONE 12.5 MG HALF TABLET
12.5000 mg | ORAL_TABLET | Freq: Every day | ORAL | Status: DC
  Administered 2019-08-26: 12.5 mg via ORAL
  Filled 2019-08-26 (×2): qty 1

## 2019-08-26 NOTE — Progress Notes (Signed)
Progress Note  Patient Name: Charles Gray Date of Encounter: 08/26/2019  Primary Cardiologist: Berniece Salines, DO   Subjective   Feeling ok very bothered by neuropathy in legs/feet  Says hypokalemia makes him feel  Real bad when he uses zaroxyln with lasix   Inpatient Medications    Scheduled Meds: . apixaban  5 mg Oral BID  . buprenorphine-naloxone  1 tablet Sublingual BID  . furosemide  80 mg Intravenous BID  . gabapentin  800 mg Oral TID  . lisinopril  5 mg Oral Daily  . pantoprazole  40 mg Oral BID  . pravastatin  20 mg Oral Daily  . sodium chloride flush  3 mL Intravenous Once  . sodium chloride flush  3 mL Intravenous Q12H   Continuous Infusions: . sodium chloride     PRN Meds: sodium chloride, acetaminophen, albuterol, ondansetron (ZOFRAN) IV, polyethylene glycol, sodium chloride flush, zolpidem   Vital Signs    Vitals:   08/25/19 1758 08/25/19 2000 08/25/19 2038 08/26/19 0500  BP: 131/62  110/65 105/62  Pulse: 81 78 73 69  Resp:   18 17  Temp: 97.7 F (36.5 C)  97.6 F (36.4 C) 98 F (36.7 C)  TempSrc:   Oral Oral  SpO2: 98% 92% 97% 97%  Weight:    (!) 168.5 kg  Height:        Intake/Output Summary (Last 24 hours) at 08/26/2019 1118 Last data filed at 08/26/2019 0858 Gross per 24 hour  Intake 1560 ml  Output 1900 ml  Net -340 ml   Last 3 Weights 08/26/2019 08/25/2019 08/24/2019  Weight (lbs) 371 lb 7.6 oz 371 lb 14.7 oz 369 lb 3.2 oz  Weight (kg) 168.5 kg 168.7 kg 167.468 kg      Telemetry    Sinus rhythm - Personally Reviewed 08/26/2019   ECG    ST LAFB no acute changes 08/25/19   Physical Exam   Chronically ill Obese Lungs clear Normal heart sounds distant Chronic venous stasis with plus 2 edema Peripheral neuropathy with poor sensation  Plus 2 DP bilaterally   Labs    High Sensitivity Troponin:   Recent Labs  Lab 08/06/19 1728 08/23/19 1355 08/23/19 1627 08/24/19 0059  TROPONINIHS 5 5 4 5       Chemistry Recent Labs    Lab 08/23/19 1213 08/23/19 1355 08/24/19 0059 08/25/19 0557 08/26/19 0747  NA   < >  --  133* 133* 132*  K   < >  --  3.1* 3.4* 3.4*  CL   < >  --  89* 89* 85*  CO2   < >  --  30 30 34*  GLUCOSE   < >  --  148* 107* 126*  BUN   < >  --  22 28* 29*  CREATININE   < >  --  1.42* 1.22 1.11  CALCIUM   < >  --  9.6 9.4 9.4  PROT  --  8.4*  --   --   --   ALBUMIN  --  3.8  --   --   --   AST  --  39  --   --   --   ALT  --  25  --   --   --   ALKPHOS  --  64  --   --   --   BILITOT  --  0.9  --   --   --   GFRNONAA   < >  --  52* >60 >60  GFRAA   < >  --  >60 >60 >60  ANIONGAP   < >  --  14 14 13    < > = values in this interval not displayed.     Hematology Recent Labs  Lab 08/24/19 0059 08/25/19 0557 08/26/19 0747  WBC 10.9* 9.0 9.0  RBC 4.91 4.58 4.91  HGB 12.3* 11.3* 12.3*  HCT 39.1 36.9* 39.7  MCV 79.6* 80.6 80.9  MCH 25.1* 24.7* 25.1*  MCHC 31.5 30.6 31.0  RDW 16.3* 16.1* 15.9*  PLT 179 165 191    BNP Recent Labs  Lab 08/23/19 1400  BNP 266.3*     DDimer No results for input(s): DDIMER in the last 168 hours.   Radiology    No results found.  Cardiac Studies    Patient Profile       Assessment & Plan    He presents with acute right heart heart failure, and appears better compensated today despite continued LE swelling, which is most likely consistent with venous stasis.  The patient states he has trouble affording anything but canned food, but has tried to add frozen meals to his regimen that are "better for him".  I suspect he will have difficulty managing his diuresis at home.  I have discussed his case with his primary cardiologist, and I wonder if he would be a good candidate for a cardio mems device which may assist him in home adjustment of diuretics prior to needing hospitalization. We will arrange   Edema: - it is not clear to me that this is right heart failure RV not well seen on echo but not grossly enlarged. Right heart pressures normal  on cath 08/09/19. 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 Was on lasix / zaroxyln as outpatient Lasix alone not helpful and zaroxyln effective but caused malaise with low K will try oral demedex and aldactone now   Chest pain - HS troponin negative, responded to GI coctail - continue to monitor  HTN - lisinopril 51m daily - had some hypotension with diuresis.  - IV lasix  DVT - Eliquis - Recent LE [place] for DVT      For questions or updates, please contact CButlerPlease consult [email or web address] for contact info under        Signed, PJenkins Rouge MD  08/26/2019, 11:18 AM

## 2019-08-26 NOTE — Progress Notes (Signed)
PROGRESS NOTE    Charles Gray  JIR:[id number] DOB: 1956/03/12 DOA: 08/23/2019 PCP: Charles Gray., MD    Brief Narrative:  64 year old male with past medical history of diastolic congestive heart failure, chronic pain syndrome, cirrhosis with identified hypertensive gastropathy on EGD 02/2019, essential hypertension, pulmonary embolism diagnosed 2/21 on Eliquis therapy, obesity who presents to [facility] emergency department with complaints of shortness of breath.  Patient recently hospitalized here for newly diagnosed congestive heart failure from late April and discharged in early May. Patient last followed up with Dr. Harriet Gray with Cardiology 5/7where patient was placed on a short course of Levaquin due to increasing redness of the bilateral lower extremities and concern for cellulitis.  Patient indicates progressively worsening shortness of breath with exertion, lower extremity edema and orthopnea.  Patient admits to poor compliance with dietary recommendations and lifestyle recommendations as outlined at previous hospitalization. Upon evaluation in the emergency department, patient received x1 dose of 80 mg of IV Lasix. Troponin was performed and was unremarkable. BNP was found to be elevated at 266 compared to 13.72 weeks ago. COVID-19 testing was performed and was found to be negative. Patient was also found to be exhibiting a mild lactic acidosis of 2.3.   Assessment & Plan:   Active Problems:   Mixed hyperlipidemia   Chronic pain syndrome   Hyponatremia   Class 2 severe obesity due to excess calories with serious comorbidity and body mass index (BMI) of 39.0 to 39.9 in adult [facility])   History of pulmonary embolism   Acute on chronic diastolic CHF (congestive heart failure) (HCC)   Redness and swelling of lower leg   Chest pain   Lactic acidosis   Heart failure, systolic, acute (HCC)   Acute on chronic congestive heart failure (HCC)   Acute on chronic diastolic CHF  (congestive heart failure), POA -Symptoms of orthopnea, dyspnea with exertion, edema, weight gain due to remarkably poor dietary compliance with newly diagnosed heart failure  - Cardiology following appreciate insight recommendations -adjsuting medications per cards-monitor an additional day - Continue to monitor for hypoxia - Transient episode of symptomatic hypotension likely orthostatic in nature yesterday, now resolved - Follow creatinine, daily weights, I's and O's quite closely - neg 5.8 L  Atypical chest pain -likely epigastric/GI in nature Unlikely ACS - No recurrent episodes, previously noted EKG unremarkable for ST elevations or depressions -Patient responded appropriately to GI cocktail -recommend PPI at discharge  Lactic Acidosis -Likely in the setting of poor perfusion secondary to above -Continue to diurese as tolerated  Mixed hyperlipidemia -Continue home statin  Chronic pain syndrome - Continue home regimen of Suboxone - This regimen has been confirmed via the controlled substance database at admission.  Hyponatremia, hypervolemic in nature -Continue aggressive diuresis as above  History of pulmonary embolism - Recently diagnosed pulmonary embolism and February 2021 - Continue home Eliquis   Redness and swelling of lower leg - Likely secondary to above, unlikely infectious in etiology -Continue to follow clinically, follow cultures  Class 2 severe obesity due to excess calories with serious comorbidity and body mass index (BMI) of 39.0 to 39.9 in adult [facility]) -Lengthy counseling at bedside daily on caloric restriction and regular physical activity   DVT prophylaxis: BP:ZWCHENI  Code Status: FULL    Code Status Orders  (From admission, onward)         Start     Ordered   08/23/19 2103  Full code  Continuous     08/23/19 2103  Code Status History    Date Active Date Inactive Code Status Order ID Comments User Context   08/06/2019 1928  08/12/2019 1835 Full Code [id number]  [name] ED   06/08/2019 0757 06/13/2019 2127 Full Code [id number]  [name], MD ED   03/13/2019 1609 03/23/2019 1341 Full Code [id number]  [name], PA-C Inpatient   03/13/2019 1609 03/13/2019 1609 Full Code [id number]  [name] Inpatient   03/04/2019 2254 03/13/2019 1551 Full Code [id number]  [name], [name], PA-C Inpatient   Advance Care Planning Activity     Family Communication: NONE PRESENT  Disposition Plan:    Status is: inpatient given ongoing required IV diuresis, close monitoring, severe heart failure exacerbation with increased risk of morbidity mortality  Dispo: The patient is from: Home  Anticipated d/c is to: Home  Anticipated d/c date is: 24-48 hours  Patient currently not medically stable for discharge due to ongoing need for IV diuretics, close monitoring, telemetry in the setting of heart failure exacerbation as above Consults called: None Admission status: Inpatient   Consultants:   CARDIOLOGY  Procedures:  DG Chest 2 View  Result Date: 08/23/2019 CLINICAL DATA:  Shortness of breath EXAM: CHEST - 2 VIEW COMPARISON:  08/06/2019 FINDINGS: The heart size and mediastinal contours are within normal limits. Both lungs are clear. The visualized skeletal structures are unremarkable. IMPRESSION: No active cardiopulmonary disease. Electronically Signed   By: Kathreen Devoid   On: 08/23/2019 12:58   DG Chest 2 View  Result Date: 08/06/2019 CLINICAL DATA:  64 year old male with history of shortness of breath. EXAM: CHEST - 2 VIEW COMPARISON:  Chest x-ray 06/26/2019. FINDINGS: Lung volumes are normal. No consolidative airspace disease. No pleural effusions. No pneumothorax. No pulmonary nodule or mass noted. Pulmonary vasculature and the cardiomediastinal silhouette are within normal limits. Multiple old healed right-sided rib fractures are incidentally noted.  IMPRESSION: 1.  No radiographic evidence of acute cardiopulmonary disease. Electronically Signed   By: Vinnie Langton M.D.   On: 08/06/2019 07:34   CARDIAC CATHETERIZATION  Result Date: 08/09/2019 1. Normal filling pressures. 2. Normal PA pressure. 3. Normal cardiac output.   ECHOCARDIOGRAM COMPLETE  Result Date: 08/07/2019    ECHOCARDIOGRAM REPORT   Patient Name:   BEVIN MAYALL Date of Exam: 08/07/2019 Medical Rec #:  [id number]       Height:       82.0 in Accession #:    [id number]      Weight:       383.6 lb Date of Birth:  [birth year]       BSA:          3.088 m Patient Age:    66 years        BP:           133/83 mmHg Patient Gender: M               HR:           70 bpm. Exam Location:  Inpatient Procedure: 2D Echo and Intracardiac Opacification Agent Indications:    Pulmonary hypertension 416.8 / I27.2  History:        Patient has no prior history of Echocardiogram examinations,                 most recent 06/08/2019. CHF; Risk Factors:Sleep Apnea,                 Hypertension and Dyslipidemia. Cirrhosis of the liver, Acute  pulmonary embolism 2/21, Thrombocytopenia, Edema.  Sonographer:    Darlina Sicilian RDCS Referring Phys: [id number] Darreld Mclean  Sonographer Comments: Technically difficult study due to poor echo windows and suboptimal apical window. Echo performed with the patient upright for comfort. IMPRESSIONS  1. Suboptimal image quality for diagnostic purposes.  2. Left ventricular ejection fraction, by estimation, is 55 to 60%. The left ventricle has normal function. Left ventricular endocardial border not optimally defined to evaluate regional wall motion. Left ventricular diastolic parameters are consistent with Grade I diastolic dysfunction (impaired relaxation).  3. Right ventricular systolic function was not well visualized. The right ventricular size is not well visualized.  4. The mitral valve is grossly normal. No evidence of mitral valve regurgitation. No evidence  of mitral stenosis.  5. The aortic valve was not well visualized. Aortic valve regurgitation is not visualized. No aortic stenosis is present. FINDINGS  Left Ventricle: Left ventricular ejection fraction, by estimation, is 55 to 60%. The left ventricle has normal function. Left ventricular endocardial border not optimally defined to evaluate regional wall motion. Definity contrast agent was given IV to delineate the left ventricular endocardial borders. The left ventricular internal cavity size was normal in size. Left ventricular diastolic parameters are consistent with Grade I diastolic dysfunction (impaired relaxation). Right Ventricle: The right ventricular size is not well visualized. Right vetricular wall thickness was not assessed. Right ventricular systolic function was not well visualized. Left Atrium: Left atrial size was not well visualized. Right Atrium: Right atrial size was not well visualized. Pericardium: The pericardium was not well visualized. Presence of pericardial fat pad. Mitral Valve: The mitral valve is grossly normal. No evidence of mitral valve regurgitation. No evidence of mitral valve stenosis. Tricuspid Valve: The tricuspid valve is not well visualized. Tricuspid valve regurgitation is not demonstrated. Aortic Valve: The aortic valve was not well visualized. Aortic valve regurgitation is not visualized. No aortic stenosis is present. Pulmonic Valve: The pulmonic valve was not well visualized. Pulmonic valve regurgitation is trivial. Aorta: Aortic root measures 37 mm which may be within normal limits for age when indexed to BSA. The aortic root was not well visualized. Venous: The inferior vena cava was not well visualized. IAS/Shunts: The interatrial septum was not well visualized.  LEFT VENTRICLE PLAX 2D                 Diastology LVOT diam:     2.30 cm  LV e' lateral:   7.94 cm/s LVOT Area:     4.15 cm LV E/e' lateral: 7.5                         LV e' medial:    7.29 cm/s                          LV E/e' medial:  8.1   AORTA Ao Root diam: 3.70 cm MITRAL VALVE MV Area (PHT): 2.56 cm    SHUNTS MV Decel Time: 296 msec    Systemic Diam: 2.30 cm MV E velocity: 59.40 cm/s MV A velocity: 70.20 cm/s MV E/A ratio:  0.85 Cherlynn Kaiser MD Electronically signed by Cherlynn Kaiser MD Signature Date/Time: 08/07/2019/7:41:07 PM    Final    VAS [place] LOWER EXTREMITY VENOUS (DVT) (ONLY [facility] & [facility])  Result Date: 08/23/2019  Lower Venous DVTStudy Indications: Swelling.  Risk Factors: DVT. Limitations: Body habitus, poor ultrasound/tissue interface and patient pain tolerance. Comparison Study:  06/08/2019 - Left Popliteal DVT Performing Technologist: Oliver Hum RVT  Examination Guidelines: A complete evaluation includes B-mode imaging, spectral Doppler, color Doppler, and power Doppler as needed of all accessible portions of each vessel. Bilateral testing is considered an integral part of a complete examination. Limited examinations for reoccurring indications may be performed as noted. The reflux portion of the exam is performed with the patient in reverse Trendelenburg.  +---------+---------------+---------+-----------+----------+--------------+  RIGHT     Compressibility Phasicity Spontaneity Properties Thrombus Aging  +---------+---------------+---------+-----------+----------+--------------+  CFV       Full            Yes       Yes                                    +---------+---------------+---------+-----------+----------+--------------+  SFJ       Full                                                             +---------+---------------+---------+-----------+----------+--------------+  FV Prox   Full                                                             +---------+---------------+---------+-----------+----------+--------------+  FV Mid    Full                                                             +---------+---------------+---------+-----------+----------+--------------+  FV Distal Full                                                              +---------+---------------+---------+-----------+----------+--------------+  PFV       Full                                                             +---------+---------------+---------+-----------+----------+--------------+  POP       Full            Yes       Yes                                    +---------+---------------+---------+-----------+----------+--------------+  PTV       Full                                                             +---------+---------------+---------+-----------+----------+--------------+  PERO      Full                                                             +---------+---------------+---------+-----------+----------+--------------+   +---------+---------------+---------+-----------+----------+--------------+  LEFT      Compressibility Phasicity Spontaneity Properties Thrombus Aging  +---------+---------------+---------+-----------+----------+--------------+  CFV       Full            Yes       Yes                                    +---------+---------------+---------+-----------+----------+--------------+  SFJ       Full                                                             +---------+---------------+---------+-----------+----------+--------------+  FV Prox   Full                                                             +---------+---------------+---------+-----------+----------+--------------+  FV Mid    Full                                                             +---------+---------------+---------+-----------+----------+--------------+  FV Distal Full                                                             +---------+---------------+---------+-----------+----------+--------------+  PFV       Full                                                             +---------+---------------+---------+-----------+----------+--------------+  POP       Full            Yes       Yes                                     +---------+---------------+---------+-----------+----------+--------------+  PTV       Full                                                             +---------+---------------+---------+-----------+----------+--------------+  PERO                                                       Not visualized  +---------+---------------+---------+-----------+----------+--------------+     Summary: RIGHT: - There is no evidence of deep vein thrombosis in the lower extremity. However, portions of this examination were limited- see technologist comments above.  - No cystic structure found in the popliteal fossa.  LEFT: - There is no evidence of deep vein thrombosis in the lower extremity. However, portions of this examination were limited- see technologist comments above.  - No cystic structure found in the popliteal fossa.  *See table(s) above for measurements and observations. Electronically signed by Curt Jews MD on 08/23/2019 at 7:28:18 PM.    Final      Antimicrobials:   NONE    Subjective: Still reports feeling short of breath, lower extremity edema present,  Objective: Vitals:   08/25/19 1758 08/25/19 2000 08/25/19 2038 08/26/19 0500  BP: 131/62  110/65 105/62  Pulse: 81 78 73 69  Resp:   18 17  Temp: 97.7 F (36.5 C)  97.6 F (36.4 C) 98 F (36.7 C)  TempSrc:   Oral Oral  SpO2: 98% 92% 97% 97%  Weight:    (!) 168.5 kg  Height:        Intake/Output Summary (Last 24 hours) at 08/26/2019 1212 Last data filed at 08/26/2019 1140 Gross per 24 hour  Intake 1560 ml  Output 2450 ml  Net -890 ml   Filed Weights   08/24/19 0600 08/25/19 0336 08/26/19 0500  Weight: (!) 167.5 kg (!) 168.7 kg (!) 168.5 kg    Examination:  General exam: Appears calm and comfortable chronically ill-appearing Respiratory system: Clear to auscultation. Respiratory effort normal. Cardiovascular system: S1 & S2 heard, RRR. No JVD, murmurs, rubs, gallops or clicks. No pedal edema. Gastrointestinal system:  Abdomen is nondistended, soft and nontender. No organomegaly or masses felt. Normal bowel sounds heard. Central nervous system: Alert and oriented. No focal neurological deficits. Extremities: Hemosiderin staining bilaterally, 1+ pitting edema bilaterally Skin: Hemosiderin staining as above otherwise no new rashes, lesions or ulcers Psychiatry: Judgement and insight appear normal. Mood & affect appropriate.     Data Reviewed: I have personally reviewed following labs and imaging studies  CBC: Recent Labs  Lab 08/23/19 1213 08/24/19 0059 08/25/19 0557 08/26/19 0747  WBC 9.2 10.9* 9.0 9.0  NEUTROABS  --  8.7*  --   --   HGB 12.2* 12.3* 11.3* 12.3*  HCT 39.7 39.1 36.9* 39.7  MCV 80.0 79.6* 80.6 80.9  PLT 186 179 165 962   Basic Metabolic Panel: Recent Labs  Lab 08/23/19 1213 08/23/19 1355 08/24/19 0059 08/25/19 0557 08/26/19 0747  NA 133*  --  133* 133* 132*  K 3.6  --  3.1* 3.4* 3.4*  CL 88*  --  89* 89* 85*  CO2 28  --  30 30 34*  GLUCOSE 122*  --  148* 107* 126*  BUN 18  --  22 28* 29*  CREATININE 1.24  --  1.42* 1.22 1.11  CALCIUM 9.5  --  9.6 9.4 9.4  MG  --  2.1 2.1  --   --    GFR: Estimated Creatinine Clearance: 123.1 mL/min (by C-G formula based on SCr of 1.11 mg/dL). Liver Function Tests:  Recent Labs  Lab 08/23/19 1355  AST 39  ALT 25  ALKPHOS 64  BILITOT 0.9  PROT 8.4*  ALBUMIN 3.8   No results for input(s): LIPASE, AMYLASE in the last 168 hours. No results for input(s): AMMONIA in the last 168 hours. Coagulation Profile: No results for input(s): INR, PROTIME in the last 168 hours. Cardiac Enzymes: No results for input(s): CKTOTAL, CKMB, CKMBINDEX, TROPONINI in the last 168 hours. BNP (last 3 results) Recent Labs    01/24/19 1121 02/27/19 1106  PROBNP 52 37   HbA1C: No results for input(s): HGBA1C in the last 72 hours. CBG: Recent Labs  Lab 08/24/19 1022  GLUCAP 120*   Lipid Profile: No results for input(s): CHOL, HDL, LDLCALC,  TRIG, CHOLHDL, LDLDIRECT in the last 72 hours. Thyroid Function Tests: No results for input(s): TSH, T4TOTAL, FREET4, T3FREE, THYROIDAB in the last 72 hours. Anemia Panel: No results for input(s): VITAMINB12, FOLATE, FERRITIN, TIBC, IRON, RETICCTPCT in the last 72 hours. Sepsis Labs: Recent Labs  Lab 08/23/19 1248 08/23/19 1627 08/24/19 0059  LATICACIDVEN 2.3* 1.8 2.3*    Recent Results (from the past 240 hour(s))  Culture, blood (routine x 2)     Status: None (Preliminary result)   Collection Time: 08/23/19  1:54 PM   Specimen: BLOOD RIGHT FOREARM  Result Value Ref Range Status   Specimen Description BLOOD RIGHT FOREARM  Final   Special Requests   Final    BOTTLES DRAWN AEROBIC AND ANAEROBIC Blood Culture results may not be optimal due to an inadequate volume of blood received in culture bottles   Culture   Final    NO GROWTH 3 DAYS Performed at [facility], [place] [address], [place], [place] [address]    Report Status PENDING  Incomplete  SARS Coronavirus 2 by RT PCR (hospital order, performed in [facility] hospital lab) Nasopharyngeal Nasopharyngeal Swab     Status: None   Collection Time: 08/23/19  3:41 PM   Specimen: Nasopharyngeal Swab  Result Value Ref Range Status   SARS Coronavirus 2 NEGATIVE NEGATIVE Final    Comment: (NOTE) SARS-CoV-2 target nucleic acids are NOT DETECTED. The SARS-CoV-2 RNA is generally detectable in upper and lower respiratory specimens during the acute phase of infection. The lowest concentration of SARS-CoV-2 viral copies this assay can detect is 250 copies / mL. A negative result does not preclude SARS-CoV-2 infection and should not be used as the sole basis for treatment or other patient management decisions.  A negative result may occur with improper specimen collection / handling, submission of specimen other than nasopharyngeal swab, presence of viral mutation(s) within the areas targeted by this assay, and inadequate number of  viral copies (<250 copies / mL). A negative result must be combined with clinical observations, patient history, and epidemiological information. Fact Sheet for Patients:   StrictlyIdeas.no Fact Sheet for Healthcare Providers: BankingDealers.co.za This test is not yet approved or cleared  by the [place] FDA and has been authorized for detection and/or diagnosis of SARS-CoV-2 by FDA under an Emergency Use Authorization (EUA).  This EUA will remain in effect (meaning this test can be used) for the duration of the COVID-19 declaration under Section 564(b)(1) of the Act, 21 U.S.C. section 360bbb-3(b)(1), unless the authorization is terminated or revoked sooner. Performed at [facility], [place] [address] [place], [place] [address]   Culture, blood (routine x 2)     Status: None (Preliminary result)   Collection Time: 08/23/19  4:27  PM   Specimen: BLOOD  Result Value Ref Range Status   Specimen Description BLOOD SITE NOT SPECIFIED  Final   Special Requests   Final    BOTTLES DRAWN AEROBIC AND ANAEROBIC Blood Culture adequate volume   Culture   Final    NO GROWTH 3 DAYS Performed at [facility], [address] [place], [place] [address]    Report Status PENDING  Incomplete         Radiology Studies: No results found.      Scheduled Meds:  apixaban  5 mg Oral BID   buprenorphine-naloxone  1 tablet Sublingual BID   gabapentin  800 mg Oral TID   lisinopril  5 mg Oral Daily   pantoprazole  40 mg Oral BID   pravastatin  20 mg Oral Daily   sodium chloride flush  3 mL Intravenous Once   sodium chloride flush  3 mL Intravenous Q12H   spironolactone  12.5 mg Oral Daily   torsemide  20 mg Oral BID   Continuous Infusions:  sodium chloride       LOS: 2 days    Time spent: 35 minutes    Nicolette Bang, MD [facility]  If 7PM-7AM, please contact night-coverage  08/26/2019, 12:12  PM

## 2019-08-27 DIAGNOSIS — I878 Other specified disorders of veins: Secondary | ICD-10-CM

## 2019-08-27 LAB — CBC
HCT: 35.3 % — ABNORMAL LOW (ref 39.0–52.0)
Hemoglobin: 10.9 g/dL — ABNORMAL LOW (ref 13.0–17.0)
MCH: 25.1 pg — ABNORMAL LOW (ref 26.0–34.0)
MCHC: 30.9 g/dL (ref 30.0–36.0)
MCV: 81.1 fL (ref 80.0–100.0)
Platelets: 158 10*3/uL (ref 150–400)
RBC: 4.35 MIL/uL (ref 4.22–5.81)
RDW: 15.7 % — ABNORMAL HIGH (ref 11.5–15.5)
WBC: 7.2 10*3/uL (ref 4.0–10.5)
nRBC: 0 % (ref 0.0–0.2)

## 2019-08-27 LAB — BASIC METABOLIC PANEL
Anion gap: 12 (ref 5–15)
BUN: 25 mg/dL — ABNORMAL HIGH (ref 8–23)
CO2: 33 mmol/L — ABNORMAL HIGH (ref 22–32)
Calcium: 9.2 mg/dL (ref 8.9–10.3)
Chloride: 88 mmol/L — ABNORMAL LOW (ref 98–111)
Creatinine, Ser: 1.1 mg/dL (ref 0.61–1.24)
GFR calc Af Amer: >60 mL/min (ref >60)
GFR calc non Af Amer: >60 mL/min (ref >60)
Glucose, Bld: 136 mg/dL — ABNORMAL HIGH (ref 70–99)
Potassium: 3.2 mmol/L — ABNORMAL LOW (ref 3.5–5.1)
Sodium: 133 mmol/L — ABNORMAL LOW (ref 135–145)

## 2019-08-27 LAB — MAGNESIUM: Magnesium: 2.2 mg/dL (ref 1.7–2.4)

## 2019-08-27 MED ORDER — MAGNESIUM CITRATE PO SOLN
1.0000 | Freq: Once | ORAL | Status: AC
  Administered 2019-08-27: 1 via ORAL
  Filled 2019-08-27: qty 296

## 2019-08-27 MED ORDER — POTASSIUM CHLORIDE CRYS ER 20 MEQ PO TBCR
40.0000 meq | EXTENDED_RELEASE_TABLET | Freq: Once | ORAL | Status: AC
  Administered 2019-08-27: 40 meq via ORAL
  Filled 2019-08-27: qty 2

## 2019-08-27 MED ORDER — SPIRONOLACTONE 25 MG PO TABS
25.0000 mg | ORAL_TABLET | Freq: Every day | ORAL | Status: DC
  Administered 2019-08-27 – 2019-08-30 (×4): 25 mg via ORAL
  Filled 2019-08-27 (×3): qty 1

## 2019-08-27 MED ORDER — MAGNESIUM HYDROXIDE 400 MG/5ML PO SUSP
30.0000 mL | Freq: Every day | ORAL | Status: DC | PRN
  Filled 2019-08-27: qty 30

## 2019-08-27 MED ORDER — BISACODYL 10 MG RE SUPP: 10.0000 mg | Freq: Every day | RECTAL | Status: DC | PRN

## 2019-08-27 NOTE — Progress Notes (Signed)
Progress Note  Patient Name: Charles Gray Date of Encounter: 08/27/2019  Primary Cardiologist: Berniece Salines, DO   Subjective   Feels a bit better this am edema improved   Inpatient Medications    Scheduled Meds: . apixaban  5 mg Oral BID  . buprenorphine-naloxone  1 tablet Sublingual BID  . gabapentin  800 mg Oral TID  . lisinopril  5 mg Oral Daily  . pantoprazole  40 mg Oral BID  . potassium chloride  40 mEq Oral Once  . pravastatin  20 mg Oral Daily  . sodium chloride flush  3 mL Intravenous Once  . sodium chloride flush  3 mL Intravenous Q12H  . spironolactone  25 mg Oral Daily  . torsemide  20 mg Oral BID   Continuous Infusions: . sodium chloride     PRN Meds: sodium chloride, acetaminophen, albuterol, ondansetron (ZOFRAN) IV, polyethylene glycol, sodium chloride flush, zolpidem   Vital Signs    Vitals:   08/26/19 0500 08/26/19 1516 08/26/19 2119 08/27/19 0421  BP: 105/62 114/69 (!) 103/59 (!) 101/56  Pulse: 69 72 77 82  Resp: 17  19 20   Temp: 98 F (36.7 C) 98.1 F (36.7 C) 98.4 F (36.9 C) 98.2 F (36.8 C)  TempSrc: Oral Oral Oral Oral  SpO2: 97% 98% 100%   Weight: (!) 168.5 kg   (!) 169 kg  Height:        Intake/Output Summary (Last 24 hours) at 08/27/2019 0901 Last data filed at 08/27/2019 0810 Gross per 24 hour  Intake 1083 ml  Output 2350 ml  Net -1267 ml   Last 3 Weights 08/27/2019 08/26/2019 08/25/2019  Weight (lbs) 372 lb 9.6 oz 371 lb 7.6 oz 371 lb 14.7 oz  Weight (kg) 169.01 kg 168.5 kg 168.7 kg      Telemetry    Sinus rhythm - Personally Reviewed 08/27/2019   ECG    ST LAFB no acute changes 08/25/19   Physical Exam   Chronically ill Obese Lungs clear Normal heart sounds distant Chronic venous stasis with plus 2 edema Peripheral neuropathy with poor sensation  Plus 2 DP bilaterally   Labs    High Sensitivity Troponin:   Recent Labs  Lab 08/06/19 1728 08/23/19 1355 08/23/19 1627 08/24/19 0059  TROPONINIHS 5 5 4 5      Chemistry Recent Labs  Lab 08/23/19 1355 08/24/19 0059 08/25/19 0557 08/26/19 0747 08/27/19 0411  NA  --    < > 133* 132* 133*  K  --    < > 3.4* 3.4* 3.2*  CL  --    < > 89* 85* 88*  CO2  --    < > 30 34* 33*  GLUCOSE  --    < > 107* 126* 136*  BUN  --    < > 28* 29* 25*  CREATININE  --    < > 1.22 1.11 1.10  CALCIUM  --    < > 9.4 9.4 9.2  PROT 8.4*  --   --   --   --   ALBUMIN 3.8  --   --   --   --   AST 39  --   --   --   --   ALT 25  --   --   --   --   ALKPHOS 64  --   --   --   --   BILITOT 0.9  --   --   --   --  GFRNONAA  --    < > >60 >60 >60  GFRAA  --    < > >60 >60 >60  ANIONGAP  --    < > 14 13 12    < > = values in this interval not displayed.     Hematology Recent Labs  Lab 08/25/19 0557 08/26/19 0747 08/27/19 0411  WBC 9.0 9.0 7.2  RBC 4.58 4.91 4.35  HGB 11.3* 12.3* 10.9*  HCT 36.9* 39.7 35.3*  MCV 80.6 80.9 81.1  MCH 24.7* 25.1* 25.1*  MCHC 30.6 31.0 30.9  RDW 16.1* 15.9* 15.7*  PLT 165 191 158    BNP Recent Labs  Lab 08/23/19 1400  BNP 266.3*     DDimer No results for input(s): DDIMER in the last 168 hours.   Radiology    No results found.  Cardiac Studies    Patient Profile       Assessment & Plan    He presents with acute right heart heart failure, and appears better compensated today despite continued LE swelling, which is most likely consistent with venous stasis.  The patient states he has trouble affording anything but canned food, but has tried to add frozen meals to his regimen that are "better for him".  I suspect he will have difficulty managing his diuresis at home.  I have discussed his case with his primary cardiologist, and I wonder if he would be a good candidate for a cardio mems device which may assist him in home adjustment of diuretics prior to needing hospitalization. We will arrange   Edema: - it is not clear to me that this is right heart failure RV not well seen on echo but not grossly enlarged.  Right heart pressures normal on cath 08/09/19. 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 Was on lasix / zaroxyln as outpatient Lasix alone not helpful and zaroxyln effective but caused malaise with low K Have changed to demedex and aldactone K 3.3 this am supplement 1.2 Liters out yesterday   Chest pain - HS troponin negative, responded to GI coctail - continue to monitor  HTN - lisinopril 60m daily - had some hypotension with diuresis.  - IV lasix  DVT - Eliquis - Recent LE [place] for DVT      For questions or updates, please contact [email or web address] consult [email or web address] for contact info under        Signed, PJenkins Rouge MD  08/27/2019, 9:01 AM

## 2019-08-27 NOTE — Progress Notes (Signed)
PROGRESS NOTE    Charles Gray  XBD:[id number] DOB: [birth year] DOA: 08/23/2019 PCP: Enid Skeens., MD   Brief Narrative:  64 year old male with past medical history of diastolic congestive heart failure, chronic pain syndrome, cirrhosis with identified hypertensive gastropathy on EGD 02/2019, essential hypertension, pulmonary embolism diagnosed 2/21 on Eliquis therapy, obesity who presents to [facility] emergency department with complaints of shortness of breath. Patient recently hospitalized here for newly diagnosed congestive heart failure from late April and discharged in early May. Patient last followed up with Dr. Harriet Masson with Cardiology 5/7where patient was placed on a short course of Levaquin due to increasing redness of the bilateral lower extremities and concern for cellulitis. Patient indicates progressively worsening shortness of breath with exertion, lower extremity edema and orthopnea. Patient admits to poor compliance with dietary recommendations and lifestyle recommendations as outlined at previous hospitalization. Upon evaluation in the emergency department, patient received x1 dose of 80 mg of IV Lasix. Troponin was performed and was unremarkable. BNP was found to be elevated at 266 compared to 13.72 weeks ago. COVID-19 testing was performed and was found to be negative. Patient was also found to be exhibiting a mild lactic acidosis of 2.3.   Assessment & Plan:   Active Problems:   Mixed hyperlipidemia   Chronic pain syndrome   Hyponatremia   Class 2 severe obesity due to excess calories with serious comorbidity and body mass index (BMI) of 39.0 to 39.9 in adult [facility])   History of pulmonary embolism   Acute on chronic diastolic CHF (congestive heart failure) (HCC)   Redness and swelling of lower leg   Chest pain   Lactic acidosis   Heart failure, systolic, acute (HCC)   Acute on chronic congestive heart failure (HCC)   Acute on chronic diastolic CHF  (congestive heart failure), POA -Symptoms of orthopnea, dyspnea with exertion, edema, weight gain due to remarkably poor dietary compliance with newly diagnosed heart failure -Cardiology following appreciate recommendations -adjsuting medications per cardsto demadex and aldactone - per cards monitor an additional day - Continue to monitor for hypoxia -Transient episode of symptomatic hypotension likely orthostatic in nature yesterday, now resolved - Follow creatinine, daily weights, I's and O's quite closely - neg 6.4 L todate  Atypical chest pain-likely epigastric/GI in nature Unlikely ACS -No recurrent episodes, previously notedEKG unremarkable for ST elevations or depressions -Patient responded appropriately toGI cocktail -recommend PPI at discharge  Lactic Acidosis -Likely in the setting of poor perfusion secondary to above -Continue to diureseas tolerated  Mixed hyperlipidemia -Continue home statin  Chronic pain syndrome - Continue home regimen of Suboxone - This regimen has been confirmed via the controlled substance database at admission.  Hyponatremia, hypervolemic in nature -Continue aggressive diuresis as above -na 133  History of pulmonary embolism - Recently diagnosed pulmonary embolism and February 2021 - Continue home Eliquis   Redness and swelling of lower leg - Likely secondary to above, unlikely infectious in etiology -Continue to follow clinically, follow cultures -consistent with venous stasis  Class 2 severe obesity due to excess calories with serious comorbidity and body mass index (BMI) of 39.0 to 39.9 in adult [facility]) -Lengthy counseling at bedside daily on caloric restriction and regular physical activity  DVT prophylaxis: [id number]  Code Status: full    Code Status Orders  (From admission, onward)         Start     Ordered   08/23/19 2103  Full code  Continuous     08/23/19 2103  Code Status History    Date Active  Date Inactive Code Status Order ID Comments User Context   08/06/2019 1928 08/12/2019 1835 Full Code [id number]  [name] ED   06/08/2019 0757 06/13/2019 2127 Full Code [id number]  [name], MD ED   03/13/2019 1609 03/23/2019 1341 Full Code [id number]  [name], PA-C Inpatient   03/13/2019 1609 03/13/2019 1609 Full Code [id number]  [name] Inpatient   03/04/2019 2254 03/13/2019 1551 Full Code [id number]  [name], [name], PA-C Inpatient   Advance Care Planning Activity     Family Communication: noen present  Disposition Plan:   Status is: inpatient given ongoing required IV diuresis, close monitoring, severe heart failure exacerbation with increased risk of morbidity mortality  Dispo: The patient is from: Home Anticipated d/c is to: Home Anticipated d/c date is: 24-48hours Patient currently not medically stable for discharge due to ongoing need for IV diuretics, close monitoring, telemetry in the setting of heart failure exacerbation as above Consults called: None Admission status: Inpatient   Consultants:   cards  Procedures:  DG Chest 2 View  Result Date: 08/23/2019 CLINICAL DATA:  Shortness of breath EXAM: CHEST - 2 VIEW COMPARISON:  08/06/2019 FINDINGS: The heart size and mediastinal contours are within normal limits. Both lungs are clear. The visualized skeletal structures are unremarkable. IMPRESSION: No active cardiopulmonary disease. Electronically Signed   By: Kathreen Devoid   On: 08/23/2019 12:58   DG Chest 2 View  Result Date: 08/06/2019 CLINICAL DATA:  64 year old male with history of shortness of breath. EXAM: CHEST - 2 VIEW COMPARISON:  Chest x-ray 06/26/2019. FINDINGS: Lung volumes are normal. No consolidative airspace disease. No pleural effusions. No pneumothorax. No pulmonary nodule or mass noted. Pulmonary vasculature and the cardiomediastinal silhouette are within normal limits. Multiple  old healed right-sided rib fractures are incidentally noted. IMPRESSION: 1.  No radiographic evidence of acute cardiopulmonary disease. Electronically Signed   By: Vinnie Langton M.D.   On: 08/06/2019 07:34   CARDIAC CATHETERIZATION  Result Date: 08/09/2019 1. Normal filling pressures. 2. Normal PA pressure. 3. Normal cardiac output.   ECHOCARDIOGRAM COMPLETE  Result Date: 08/07/2019    ECHOCARDIOGRAM REPORT   Patient Name:   JARETT DRALLE Date of Exam: 08/07/2019 Medical Rec #:  [id number]       Height:       82.0 in Accession #:    [id number]      Weight:       383.6 lb Date of Birth:  January 09, 1956       BSA:          3.088 m Patient Age:    55 years        BP:           133/83 mmHg Patient Gender: M               HR:           70 bpm. Exam Location:  Inpatient Procedure: 2D Echo and Intracardiac Opacification Agent Indications:    Pulmonary hypertension 416.8 / I27.2  History:        Patient has no prior history of Echocardiogram examinations,                 most recent 06/08/2019. CHF; Risk Factors:Sleep Apnea,                 Hypertension and Dyslipidemia. Cirrhosis of the liver, Acute  pulmonary embolism 2/21, Thrombocytopenia, Edema.  Sonographer:    Darlina Sicilian RDCS Referring Phys: [id number] Darreld Mclean  Sonographer Comments: Technically difficult study due to poor echo windows and suboptimal apical window. Echo performed with the patient upright for comfort. IMPRESSIONS  1. Suboptimal image quality for diagnostic purposes.  2. Left ventricular ejection fraction, by estimation, is 55 to 60%. The left ventricle has normal function. Left ventricular endocardial border not optimally defined to evaluate regional wall motion. Left ventricular diastolic parameters are consistent with Grade I diastolic dysfunction (impaired relaxation).  3. Right ventricular systolic function was not well visualized. The right ventricular size is not well visualized.  4. The mitral valve is grossly  normal. No evidence of mitral valve regurgitation. No evidence of mitral stenosis.  5. The aortic valve was not well visualized. Aortic valve regurgitation is not visualized. No aortic stenosis is present. FINDINGS  Left Ventricle: Left ventricular ejection fraction, by estimation, is 55 to 60%. The left ventricle has normal function. Left ventricular endocardial border not optimally defined to evaluate regional wall motion. Definity contrast agent was given IV to delineate the left ventricular endocardial borders. The left ventricular internal cavity size was normal in size. Left ventricular diastolic parameters are consistent with Grade I diastolic dysfunction (impaired relaxation). Right Ventricle: The right ventricular size is not well visualized. Right vetricular wall thickness was not assessed. Right ventricular systolic function was not well visualized. Left Atrium: Left atrial size was not well visualized. Right Atrium: Right atrial size was not well visualized. Pericardium: The pericardium was not well visualized. Presence of pericardial fat pad. Mitral Valve: The mitral valve is grossly normal. No evidence of mitral valve regurgitation. No evidence of mitral valve stenosis. Tricuspid Valve: The tricuspid valve is not well visualized. Tricuspid valve regurgitation is not demonstrated. Aortic Valve: The aortic valve was not well visualized. Aortic valve regurgitation is not visualized. No aortic stenosis is present. Pulmonic Valve: The pulmonic valve was not well visualized. Pulmonic valve regurgitation is trivial. Aorta: Aortic root measures 37 mm which may be within normal limits for age when indexed to BSA. The aortic root was not well visualized. Venous: The inferior vena cava was not well visualized. IAS/Shunts: The interatrial septum was not well visualized.  LEFT VENTRICLE PLAX 2D                 Diastology LVOT diam:     2.30 cm  LV e' lateral:   7.94 cm/s LVOT Area:     4.15 cm LV E/e' lateral: 7.5                          LV e' medial:    7.29 cm/s                         LV E/e' medial:  8.1   AORTA Ao Root diam: 3.70 cm MITRAL VALVE MV Area (PHT): 2.56 cm    SHUNTS MV Decel Time: 296 msec    Systemic Diam: 2.30 cm MV E velocity: 59.40 cm/s MV A velocity: 70.20 cm/s MV E/A ratio:  0.85 Cherlynn Kaiser MD Electronically signed by Cherlynn Kaiser MD Signature Date/Time: 08/07/2019/7:41:07 PM    Final    VAS [place] LOWER EXTREMITY VENOUS (DVT) (ONLY [facility] & [facility])  Result Date: 08/23/2019  Lower Venous DVTStudy Indications: Swelling.  Risk Factors: DVT. Limitations: Body habitus, poor ultrasound/tissue interface and patient pain tolerance. Comparison Study:  06/08/2019 - Left Popliteal DVT Performing Technologist: Oliver Hum RVT  Examination Guidelines: A complete evaluation includes B-mode imaging, spectral Doppler, color Doppler, and power Doppler as needed of all accessible portions of each vessel. Bilateral testing is considered an integral part of a complete examination. Limited examinations for reoccurring indications may be performed as noted. The reflux portion of the exam is performed with the patient in reverse Trendelenburg.  +---------+---------------+---------+-----------+----------+--------------+ RIGHT    CompressibilityPhasicitySpontaneityPropertiesThrombus Aging +---------+---------------+---------+-----------+----------+--------------+ CFV      Full           Yes      Yes                                 +---------+---------------+---------+-----------+----------+--------------+ SFJ      Full                                                        +---------+---------------+---------+-----------+----------+--------------+ FV Prox  Full                                                        +---------+---------------+---------+-----------+----------+--------------+ FV Mid   Full                                                         +---------+---------------+---------+-----------+----------+--------------+ FV DistalFull                                                        +---------+---------------+---------+-----------+----------+--------------+ PFV      Full                                                        +---------+---------------+---------+-----------+----------+--------------+ POP      Full           Yes      Yes                                 +---------+---------------+---------+-----------+----------+--------------+ PTV      Full                                                        +---------+---------------+---------+-----------+----------+--------------+ PERO     Full                                                        +---------+---------------+---------+-----------+----------+--------------+   +---------+---------------+---------+-----------+----------+--------------+  LEFT     CompressibilityPhasicitySpontaneityPropertiesThrombus Aging +---------+---------------+---------+-----------+----------+--------------+ CFV      Full           Yes      Yes                                 +---------+---------------+---------+-----------+----------+--------------+ SFJ      Full                                                        +---------+---------------+---------+-----------+----------+--------------+ FV Prox  Full                                                        +---------+---------------+---------+-----------+----------+--------------+ FV Mid   Full                                                        +---------+---------------+---------+-----------+----------+--------------+ FV DistalFull                                                        +---------+---------------+---------+-----------+----------+--------------+ PFV      Full                                                         +---------+---------------+---------+-----------+----------+--------------+ POP      Full           Yes      Yes                                 +---------+---------------+---------+-----------+----------+--------------+ PTV      Full                                                        +---------+---------------+---------+-----------+----------+--------------+ PERO                                                  Not visualized +---------+---------------+---------+-----------+----------+--------------+     Summary: RIGHT: - There is no evidence of deep vein thrombosis in the lower extremity. However, portions of this examination were limited- see technologist comments above.  - No cystic structure found in the popliteal fossa.  LEFT: - There is no evidence of deep vein thrombosis in the lower extremity. However,  portions of this examination were limited- see technologist comments above.  - No cystic structure found in the popliteal fossa.  *See table(s) above for measurements and observations. Electronically signed by Curt Jews MD on 08/23/2019 at 7:28:18 PM.    Final      Antimicrobials:   none    Subjective: Reports still with le edema, alos reports no bm   Objective: Vitals:   08/26/19 0500 08/26/19 1516 08/26/19 2119 08/27/19 0421  BP: 105/62 114/69 (!) 103/59 (!) 101/56  Pulse: 69 72 77 82  Resp: 17  19 20   Temp: 98 F (36.7 C) 98.1 F (36.7 C) 98.4 F (36.9 C) 98.2 F (36.8 C)  TempSrc: Oral Oral Oral Oral  SpO2: 97% 98% 100%   Weight: (!) 168.5 kg   (!) 169 kg  Height:        Intake/Output Summary (Last 24 hours) at 08/27/2019 1132 Last data filed at 08/27/2019 0810 Gross per 24 hour  Intake 1083 ml  Output 2350 ml  Net -1267 ml   Filed Weights   08/25/19 0336 08/26/19 0500 08/27/19 0421  Weight: (!) 168.7 kg (!) 168.5 kg (!) 169 kg    Examination:  General exam: Appears calm and comfortable chronically ill-appearing Respiratory system: Clear  to auscultation. Respiratory effort normal. Cardiovascular system: S1 & S2 heard, RRR. No JVD, murmurs, rubs, gallops or clicks. No pedal edema. Gastrointestinal system: Abdomen is nondistended, soft and nontender. No organomegaly or masses felt. Normal bowel sounds heard. Central nervous system: Alert and oriented. No focal neurological deficits. Extremities: Hemosiderin staining bilaterally, 1+ pitting edema bilaterally Skin: Hemosiderin staining as above otherwise no new rashes, lesions or ulcers Psychiatry: Judgement and insight appear normal. Mood & affect appropriate.     Data Reviewed: I have personally reviewed following labs and imaging studies  CBC: Recent Labs  Lab 08/23/19 1213 08/24/19 0059 08/25/19 0557 08/26/19 0747 08/27/19 0411  WBC 9.2 10.9* 9.0 9.0 7.2  NEUTROABS  --  8.7*  --   --   --   HGB 12.2* 12.3* 11.3* 12.3* 10.9*  HCT 39.7 39.1 36.9* 39.7 35.3*  MCV 80.0 79.6* 80.6 80.9 81.1  PLT 186 179 165 191 027   Basic Metabolic Panel: Recent Labs  Lab 08/23/19 1213 08/23/19 1355 08/24/19 0059 08/25/19 0557 08/26/19 0747 08/27/19 0411  NA 133*  --  133* 133* 132* 133*  K 3.6  --  3.1* 3.4* 3.4* 3.2*  CL 88*  --  89* 89* 85* 88*  CO2 28  --  30 30 34* 33*  GLUCOSE 122*  --  148* 107* 126* 136*  BUN 18  --  22 28* 29* 25*  CREATININE 1.24  --  1.42* 1.22 1.11 1.10  CALCIUM 9.5  --  9.6 9.4 9.4 9.2  MG  --  2.1 2.1  --   --  2.2   GFR: Estimated Creatinine Clearance: 124.4 mL/min (by C-G formula based on SCr of 1.1 mg/dL). Liver Function Tests: Recent Labs  Lab 08/23/19 1355  AST 39  ALT 25  ALKPHOS 64  BILITOT 0.9  PROT 8.4*  ALBUMIN 3.8   No results for input(s): LIPASE, AMYLASE in the last 168 hours. No results for input(s): AMMONIA in the last 168 hours. Coagulation Profile: No results for input(s): INR, PROTIME in the last 168 hours. Cardiac Enzymes: No results for input(s): CKTOTAL, CKMB, CKMBINDEX, TROPONINI in the last 168  hours. BNP (last 3 results) Recent Labs    01/24/19 1121  02/27/19 1106  PROBNP 52 37   HbA1C: No results for input(s): HGBA1C in the last 72 hours. CBG: Recent Labs  Lab 08/24/19 1022  GLUCAP 120*   Lipid Profile: No results for input(s): CHOL, HDL, LDLCALC, TRIG, CHOLHDL, LDLDIRECT in the last 72 hours. Thyroid Function Tests: No results for input(s): TSH, T4TOTAL, FREET4, T3FREE, THYROIDAB in the last 72 hours. Anemia Panel: No results for input(s): VITAMINB12, FOLATE, FERRITIN, TIBC, IRON, RETICCTPCT in the last 72 hours. Sepsis Labs: Recent Labs  Lab 08/23/19 1248 08/23/19 1627 08/24/19 0059  LATICACIDVEN 2.3* 1.8 2.3*    Recent Results (from the past 240 hour(s))  Culture, blood (routine x 2)     Status: None (Preliminary result)   Collection Time: 08/23/19  1:54 PM   Specimen: BLOOD RIGHT FOREARM  Result Value Ref Range Status   Specimen Description BLOOD RIGHT FOREARM  Final   Special Requests   Final    BOTTLES DRAWN AEROBIC AND ANAEROBIC Blood Culture results may not be optimal due to an inadequate volume of blood received in culture bottles   Culture   Final    NO GROWTH 4 DAYS Performed at [facility], [place] [address], [place], [place] [address]    Report Status PENDING  Incomplete  SARS Coronavirus 2 by RT PCR (hospital order, performed in [facility] hospital lab) Nasopharyngeal Nasopharyngeal Swab     Status: None   Collection Time: 08/23/19  3:41 PM   Specimen: Nasopharyngeal Swab  Result Value Ref Range Status   SARS Coronavirus 2 NEGATIVE NEGATIVE Final    Comment: (NOTE) SARS-CoV-2 target nucleic acids are NOT DETECTED. The SARS-CoV-2 RNA is generally detectable in upper and lower respiratory specimens during the acute phase of infection. The lowest concentration of SARS-CoV-2 viral copies this assay can detect is 250 copies / mL. A negative result does not preclude SARS-CoV-2 infection and should not be used as the sole basis for  treatment or other patient management decisions.  A negative result may occur with improper specimen collection / handling, submission of specimen other than nasopharyngeal swab, presence of viral mutation(s) within the areas targeted by this assay, and inadequate number of viral copies (<250 copies / mL). A negative result must be combined with clinical observations, patient history, and epidemiological information. Fact Sheet for Patients:   StrictlyIdeas.no Fact Sheet for Healthcare Providers: BankingDealers.co.za This test is not yet approved or cleared  by the [place] FDA and has been authorized for detection and/or diagnosis of SARS-CoV-2 by FDA under an Emergency Use Authorization (EUA).  This EUA will remain in effect (meaning this test can be used) for the duration of the COVID-19 declaration under Section 564(b)(1) of the Act, 21 U.S.C. section 360bbb-3(b)(1), unless the authorization is terminated or revoked sooner. Performed at [facility], [place] [address], [place], [place] [address]   Culture, blood (routine x 2)     Status: None (Preliminary result)   Collection Time: 08/23/19  4:27 PM   Specimen: BLOOD  Result Value Ref Range Status   Specimen Description BLOOD SITE NOT SPECIFIED  Final   Special Requests   Final    BOTTLES DRAWN AEROBIC AND ANAEROBIC Blood Culture adequate volume   Culture   Final    NO GROWTH 4 DAYS Performed at [facility], [address], [place], [place] [address]    Report Status PENDING  Incomplete         Radiology Studies: No results found.  Scheduled Meds: . apixaban  5 mg Oral BID  . buprenorphine-naloxone  1 tablet Sublingual BID  . gabapentin  800 mg Oral TID  . lisinopril  5 mg Oral Daily  . magnesium citrate  1 Bottle Oral Once  . pantoprazole  40 mg Oral BID  . pravastatin  20 mg Oral Daily  . sodium chloride flush  3 mL Intravenous Once  . sodium  chloride flush  3 mL Intravenous Q12H  . spironolactone  25 mg Oral Daily  . torsemide  20 mg Oral BID   Continuous Infusions: . sodium chloride       LOS: 3 days    Time spent: 35 min    Nicolette Bang, MD [facility]  If 7PM-7AM, please contact night-coverage  08/27/2019, 11:32 AM

## 2019-08-28 DIAGNOSIS — Z6839 Body mass index (BMI) 39.0-39.9, adult: Secondary | ICD-10-CM

## 2019-08-28 LAB — BASIC METABOLIC PANEL
Anion gap: 12 (ref 5–15)
BUN: 23 mg/dL (ref 8–23)
CO2: 33 mmol/L — ABNORMAL HIGH (ref 22–32)
Calcium: 8.8 mg/dL — ABNORMAL LOW (ref 8.9–10.3)
Chloride: 88 mmol/L — ABNORMAL LOW (ref 98–111)
Creatinine, Ser: 1.18 mg/dL (ref 0.61–1.24)
GFR calc Af Amer: >60 mL/min (ref >60)
GFR calc non Af Amer: >60 mL/min (ref >60)
Glucose, Bld: 157 mg/dL — ABNORMAL HIGH (ref 70–99)
Potassium: 3.2 mmol/L — ABNORMAL LOW (ref 3.5–5.1)
Sodium: 133 mmol/L — ABNORMAL LOW (ref 135–145)

## 2019-08-28 LAB — CULTURE, BLOOD (ROUTINE X 2)
Culture: NO GROWTH
Culture: NO GROWTH
Special Requests: ADEQUATE

## 2019-08-28 LAB — CBC
HCT: 36.5 % — ABNORMAL LOW (ref 39.0–52.0)
Hemoglobin: 11.1 g/dL — ABNORMAL LOW (ref 13.0–17.0)
MCH: 25.1 pg — ABNORMAL LOW (ref 26.0–34.0)
MCHC: 30.4 g/dL (ref 30.0–36.0)
MCV: 82.4 fL (ref 80.0–100.0)
Platelets: 157 10*3/uL (ref 150–400)
RBC: 4.43 MIL/uL (ref 4.22–5.81)
RDW: 16 % — ABNORMAL HIGH (ref 11.5–15.5)
WBC: 7.2 10*3/uL (ref 4.0–10.5)
nRBC: 0 % (ref 0.0–0.2)

## 2019-08-28 MED ORDER — POTASSIUM CHLORIDE CRYS ER 20 MEQ PO TBCR
40.0000 meq | EXTENDED_RELEASE_TABLET | Freq: Once | ORAL | Status: AC
  Administered 2019-08-28: 40 meq via ORAL
  Filled 2019-08-28: qty 2

## 2019-08-28 NOTE — Care Management Important Message (Signed)
Important Message  Patient Details  Name: Charles Gray MRN: [id number] Date of Birth: Jul 17, 1955   Medicare Important Message Given:  Yes     Shelda Altes 08/28/2019, 11:43 AM

## 2019-08-28 NOTE — Progress Notes (Signed)
Progress Note  Patient Name: Charles Gray Date of Encounter: 08/28/2019  Primary Cardiologist: Berniece Salines, DO   Subjective   Denies shortness of breath, stable lower extremity edema.  Inpatient Medications    Scheduled Meds: . apixaban  5 mg Oral BID  . buprenorphine-naloxone  1 tablet Sublingual BID  . gabapentin  800 mg Oral TID  . lisinopril  5 mg Oral Daily  . pantoprazole  40 mg Oral BID  . potassium chloride  40 mEq Oral Once  . pravastatin  20 mg Oral Daily  . sodium chloride flush  3 mL Intravenous Once  . sodium chloride flush  3 mL Intravenous Q12H  . spironolactone  25 mg Oral Daily  . torsemide  20 mg Oral BID   Continuous Infusions: . sodium chloride     PRN Meds: sodium chloride, acetaminophen, albuterol, bisacodyl, magnesium hydroxide, ondansetron (ZOFRAN) IV, polyethylene glycol, sodium chloride flush, zolpidem   Vital Signs    Vitals:   08/27/19 2023 08/28/19 0259 08/28/19 0306 08/28/19 0812  BP: 103/61  131/70 105/60  Pulse: 80  86 96  Resp: 20  20 17   Temp: 98.2 F (36.8 C)  98.2 F (36.8 C) 98.7 F (37.1 C)  TempSrc: Oral  Oral Oral  SpO2: 98%  99% 96%  Weight:  (!) 168.8 kg    Height:        Intake/Output Summary (Last 24 hours) at 08/28/2019 0944 Last data filed at 08/28/2019 0850 Gross per 24 hour  Intake 1683 ml  Output 1100 ml  Net 583 ml   Last 3 Weights 08/28/2019 08/27/2019 08/26/2019  Weight (lbs) 372 lb 3.2 oz 372 lb 9.6 oz 371 lb 7.6 oz  Weight (kg) 168.829 kg 169.01 kg 168.5 kg      Telemetry    Sinus rhythm - Personally Reviewed 08/28/2019   ECG    ST LAFB no acute changes 08/25/19   Physical Exam   Chronically ill Obese Lungs clear Normal heart sounds distant Chronic venous stasis with plus 2 edema Peripheral neuropathy with poor sensation  Plus 2 DP bilaterally   Labs    High Sensitivity Troponin:   Recent Labs  Lab 08/06/19 1728 08/23/19 1355 08/23/19 1627 08/24/19 0059  TROPONINIHS 5 5 4 5      Chemistry Recent Labs  Lab 08/23/19 1355 08/24/19 0059 08/26/19 0747 08/27/19 0411 08/28/19 0344  NA  --    < > 132* 133* 133*  K  --    < > 3.4* 3.2* 3.2*  CL  --    < > 85* 88* 88*  CO2  --    < > 34* 33* 33*  GLUCOSE  --    < > 126* 136* 157*  BUN  --    < > 29* 25* 23  CREATININE  --    < > 1.11 1.10 1.18  CALCIUM  --    < > 9.4 9.2 8.8*  PROT 8.4*  --   --   --   --   ALBUMIN 3.8  --   --   --   --   AST 39  --   --   --   --   ALT 25  --   --   --   --   ALKPHOS 64  --   --   --   --   BILITOT 0.9  --   --   --   --   GFRNONAA  --    < > >  60 >60 >60  GFRAA  --    < > >60 >60 >60  ANIONGAP  --    < > 13 12 12    < > = values in this interval not displayed.     Hematology Recent Labs  Lab 08/26/19 0747 08/27/19 0411 08/28/19 0344  WBC 9.0 7.2 7.2  RBC 4.91 4.35 4.43  HGB 12.3* 10.9* 11.1*  HCT 39.7 35.3* 36.5*  MCV 80.9 81.1 82.4  MCH 25.1* 25.1* 25.1*  MCHC 31.0 30.9 30.4  RDW 15.9* 15.7* 16.0*  PLT 191 158 157    BNP Recent Labs  Lab 08/23/19 1400  BNP 266.3*     DDimer No results for input(s): DDIMER in the last 168 hours.   Radiology    No results found.  Cardiac Studies    Patient Profile       Assessment & Plan    Bilateral lower extremity edema -The patient was labeled as right-sided heart failure, however I have personally reviewed his echocardiogram and his RV seems to have normal size and function. -The patient seems to have dependent edema from morbid obesity, chronic venous stasis secondary to DVT and PE, -Patient does not tolerate diuretics well with persistent hypokalemia that needs to be replaced. -I would focus on low-sodium diet, weight loss, increase patient's mobility, perform ankle pumps, -Continue spironolactone and torsemide.  Chest pain - HS troponin negative, responded to GI coctail - continue to monitor  HTN - lisinopril 40m daily - had some hypotension with diuresis.   DVT - Eliquis - Recent LE [place] negative for DVT  Hypokalemia -Replace potassium.  We will sign off, call u[place]ith any questions.  For questions or updates, please contact CSouth HutchinsonPlease consult [email or web address] for contact info under     Signed, KEna Dawley MD  08/28/2019, 9:44 AM

## 2019-08-28 NOTE — TOC Initial Note (Signed)
Transition of Care [facility]) - Initial/Assessment Note    Patient Details  Name: Charles Gray MRN: [id number] Date of Birth: 01-24-1956  Transition of Care [facility]) CM/SW Contact:    Bethena Roys, RN Phone Number: 08/28/2019, 3:05 PM  Clinical Narrative:   High risk for readmission assessment completed. Patient presented for shortness of breath. Prior to arrival patient was from home with the support of his son. Patient states his son takes him to all appointments when he can. Case Manager discussed [facility] in [facility] and patient states he has to wait for pick up which is usually a long wait. Case Manager did discuss the option of a cab, Uber vs Lyft. Patient states he has some money to pay for transportation. Patient has a primary care provider and he uses [facility] for medications. Pt is currently active with [place] for Registered Nurse and Physical Therapy- Social Worker to be added for additional assistance with transportation. Pt will need new orders and F2F once stable. Patient is currently working with his PCP for documentation to get an in home personal care aide via [facility] in [place]. Case Manager did ask patient to call DSS in [place] to make sure that request is in process. Case Manager will continue to follow for additional transition of care needs.               Expected Discharge Plan: [place] Barriers to Discharge: No Barriers Identified   Patient Goals and CMS Choice Patient states their goals for this hospitalization and ongoing recovery are:: to return home CMS Medicare.gov Compare Post Acute Care list provided to:: Patient Choice offered to / list presented to : Patient  Expected Discharge Plan and Services Expected Discharge Plan: [place] In-house Referral: NA Discharge Planning Services: CM Consult Post Acute Care Choice: Home Health, Resumption of Svcs/PTA Provider Living arrangements for  the past 2 months: Apartment                 DME Arranged: N/A         HH Arranged: OT, PT, Social Work(Social work for assistance with additional transportation.) [place]: Microbiologist ([place]) Date [place]: 08/28/19 Time [place]: 1504 Representative spoke with at [place]: Butch Penny  Prior Living Arrangements/Services Living arrangements for the past 2 months: [place] with:: Self(Has support of son.) Patient language and need for interpreter reviewed:: Yes Do you feel safe going back to the place where you live?: Yes      Need for Family Participation in Patient Care: Yes (Comment) Care giver support system in place?: Yes (comment) Current home services: DME(Pt has RW and [place]) Criminal Activity/Legal Involvement Pertinent to Current Situation/Hospitalization: No - Comment as needed  Activities of Daily Living Home Assistive Devices/Equipment: Shower chair with back, Environmental consultant (specify type) ADL Screening (condition at time of admission) Patient's cognitive ability adequate to safely complete daily activities?: Yes Is the patient deaf or have difficulty hearing?: No Does the patient have difficulty seeing, even when wearing glasses/contacts?: No Does the patient have difficulty concentrating, remembering, or making decisions?: No Patient able to express need for assistance with ADLs?: Yes Does the patient have difficulty dressing or bathing?: No Independently performs ADLs?: Yes (appropriate for developmental age) Does the patient have difficulty walking or climbing stairs?: No Weakness of Legs: Both Weakness of Arms/Hands: None  Permission Sought/Granted Permission sought to share information with : Family Supports, Customer service manager  Permission granted to share information with : Yes, Verbal Permission Granted     Permission granted to share info w AGENCY: [facility]        Emotional Assessment Appearance::  Appears stated age Attitude/Demeanor/Rapport: Engaged Affect (typically observed): Appropriate Orientation: : Oriented to Self, Oriented to Place, Oriented to  Time, Oriented to Situation Alcohol / Substance Use: Not Applicable Psych Involvement: No (comment)  Admission diagnosis:  Acute on chronic diastolic (congestive) heart failure (HCC) [I50.33] Acute on chronic congestive heart failure, unspecified heart failure type ([place]) [I50.9] Heart failure, systolic, acute ([place]) [G92.01] Patient Active Problem List   Diagnosis Date Noted  . Heart failure, systolic, acute ([place]) 00/71/2197  . Acute on chronic congestive heart failure ([place])   . Acute on chronic diastolic CHF (congestive heart failure) ([place]) 08/23/2019  . Redness and swelling of lower leg 08/23/2019  . Chest pain 08/23/2019  . Lactic acidosis 08/23/2019  . History of pulmonary embolism 08/12/2019  . Atrial tachycardia ([place]) 08/12/2019  . Occasional tremors 08/12/2019  . OSA (obstructive sleep apnea) 08/12/2019  . Acute on chronic right-sided heart failure ([place]) 08/06/2019  . Pulmonary embolism ([place]) 06/08/2019  . Normocytic anemia 06/08/2019  . Pulmonary emboli ([place]) 06/08/2019  . Chronic heart failure with preserved ejection fraction ([place]) 05/30/2019  . Class 2 severe obesity due to excess calories with serious comorbidity and body mass index (BMI) of 39.0 to 39.9 in adult ([place]) 05/30/2019  . Hyponatremia   . Benign essential HTN   . Drug induced constipation   . Peripheral edema   . Hypotension due to drugs   . Hypoalbuminemia due to protein-calorie malnutrition ([place])   . Hypokalemia   . Thrombocytopenia ([place])   . Anemia of chronic disease   . Cirrhosis of liver without ascites ([place])   . Chronic pain syndrome   . Debility 03/13/2019  . Dyspnea   . Mixed hyperlipidemia 01/24/2019  . Insomnia 01/24/2019   PCP:  Enid Skeens., MD Pharmacy:   [facility], [address] [place]  [place] [address] Phone: [phone] Fax: [place], [place] [address] [place] [address] Phone: [phone] Fax: [phone]     Social Determinants of Health (SDOH) Interventions    Readmission Risk Interventions Readmission Risk Prevention Plan 08/28/2019 08/08/2019  Transportation Screening Complete Complete  HRI or [place] - Complete  Social Work Consult for [place] Planning/Counseling - Complete  Medication Review Press photographer) Complete Complete  SW Recovery Care/Counseling Consult Complete -  Palliative Care Screening Not Applicable -  [place] Not Applicable -

## 2019-08-28 NOTE — Progress Notes (Signed)
PROGRESS NOTE  Charles Gray WUJ:[id number] DOB: 1955-05-21 DOA: 08/23/2019 PCP: Enid Skeens., MD  Brief History   64 year old male with past medical history of diastolic congestive heart failure, chronic pain syndrome, cirrhosis with identified hypertensive gastropathy on EGD 02/2019, essential hypertension, pulmonary embolism diagnosed 2/21 on Eliquis therapy, obesity who presents to [facility] emergency department with complaints of shortness of breath. Patient recently hospitalized here for newly diagnosed congestive heart failure from late April and discharged in early May. Patient last followed up with Dr. Harriet Masson with Cardiology 5/7where patient was placed on a short course of Levaquin due to increasing redness of the bilateral lower extremities and concern for cellulitis. Patient indicates progressively worsening shortness of breath with exertion, lower extremity edema and orthopnea. Patient admits to poor compliance with dietary recommendations and lifestyle recommendations as outlined at previous hospitalization. Upon evaluation in the emergency department, patient received x1 dose of 80 mg of IV Lasix. Troponin was performed and was unremarkable. BNP was found to be elevated at 266 compared to 13.72 weeks ago. COVID-19 testing was performed and was found to be negative. Patient was also found to be exhibiting a mild lactic acidosis of 2.3.  Consultants  . Cardiology  Procedures  . None  Antibiotics   Anti-infectives (From admission, onward)   None    .  Subjective  The patient states that he is not doing well this morning. He was given cathartics for constipation which has resulted in multiple loose BM's overnight this morning.   Objective   Vitals:  Vitals:   08/28/19 0812 08/28/19 1125  BP: 105/60 (!) 98/48  Pulse: 96 76  Resp: 17 16  Temp: 98.7 F (37.1 C) 98.4 F (36.9 C)  SpO2: 96% 97%   Exam:  Constitutional:  . The patient is awake, alert,  and oriented x 3. No acute distress. Respiratory:  . No increased work of breathing. . No wheezes, rales, or rhonchi . No tactile fremitus Cardiovascular:  . Regular rate and rhythm . No murmurs, ectopy, or gallups. . No lateral PMI. No thrills. Abdomen:  . Abdomen is soft, non-tender, non-distended . No hernias, masses, or organomegaly . Hyperactive bowel sounds.  Musculoskeletal:  . No cyanosis or clubbing . +2 pitting edema of lower extremities bilaterally with some wrinkling.  Skin:  . No rashes, lesions, ulcers . palpation of skin: no induration or nodules Neurologic:  . CN 2-12 intact . Sensation all 4 extremities intact Psychiatric:  . Mental status o Mood, affect appropriate o Orientation to person, place, time  . judgment and insight appear intact  I have personally reviewed the following:   Today's Data  . Vitals, BMP, CBC  Micro Data  . Blood cultures x 2: No growth.  Cardiology Data  . EKG 08/25/2019: Sinus tachycardia with LFB Scheduled Meds: . apixaban  5 mg Oral BID  . buprenorphine-naloxone  1 tablet Sublingual BID  . gabapentin  800 mg Oral TID  . lisinopril  5 mg Oral Daily  . pantoprazole  40 mg Oral BID  . pravastatin  20 mg Oral Daily  . sodium chloride flush  3 mL Intravenous Once  . sodium chloride flush  3 mL Intravenous Q12H  . spironolactone  25 mg Oral Daily  . torsemide  20 mg Oral BID   Continuous Infusions: . sodium chloride      Active Problems:   Mixed hyperlipidemia   Chronic pain syndrome   Hyponatremia   Class 2 severe obesity due  to excess calories with serious comorbidity and body mass index (BMI) of 39.0 to 39.9 in adult [facility])   History of pulmonary embolism   Acute on chronic diastolic CHF (congestive heart failure) (HCC)   Redness and swelling of lower leg   Chest pain   Lactic acidosis   Heart failure, systolic, acute ([place])   Acute on chronic congestive heart failure (HCC)   LOS: 4 days   A & P  Acute on  chronic diastolic CHF (congestive heart failure), POA: Pt presented with orthopnea, dyspnea with exertion, edema, weight gain. He had very poor dietary compliance with newly diagnosed heart failure. Pt is currently on demadex and aldactone. Cardiology recommended continued monitoring for hypoxia/hypotension, and AKI. The patient is currently negative 5.59 L in terms of fluid balance.    Atypical chest pain-likely epigastric/GI in nature: Unlikely ACS per cardiology. Pain responded to a GI cocktail with no recurrences. No ischemic changes on EKG. PPI at discharge.  Lactic Acidosis: Likely due to CHF. 2.3 on 08/24/2019. Will recheck for resolution.  Mixed hyperlipidemia: Continue home statin  Chronic pain syndrome: Continue home regimen of Suboxone as  confirmed via the controlled substance database at admission.  Hyponatremia, hypervolemic in nature: Mild. Stable at 133. Monitor.  History of pulmonary embolism: Recently diagnosed pulmonary embolism and February 2021. Continue home Eliquis as at home.  Redness and swelling of lower leg: Likely due to venous stasis and not infectious in etiology. Consider compression hose at discharge.  Diarrhea: Patient has had multiple loose BM's overnight and this morning after receiving cathartics for constipation. Monitor diarrhea, volume status, and electrolytes.  Class 2 severe obesity due to excess calories with serious comorbidity and body mass index (BMI) of 39.0 to 39.9 in adult [facility]):  Lengthy counseling at bedside daily on caloric restriction and regular physical activity. Obesity complicates all aspects of care.  I have seen and examined this patient myself. I have spent 35 minutes in his evaluation and care.  DVT prophylaxis: [id number]  Code Status: full   Family Communication: None available   Disposition: The patient is from home. Anticipate discharge to home. Barrier to discharge is patient's ongoing severe diarrhea. Requires  monitoring of volume status, and electrolytes.   Charles Pasquariello, DO [facility] Direct contact: see [email or web address]  7PM-7AM contact night coverage as above 08/28/2019, 3:44 PM  LOS: 4 days

## 2019-08-29 LAB — CBC
HCT: 35.9 % — ABNORMAL LOW (ref 39.0–52.0)
Hemoglobin: 10.9 g/dL — ABNORMAL LOW (ref 13.0–17.0)
MCH: 24.9 pg — ABNORMAL LOW (ref 26.0–34.0)
MCHC: 30.4 g/dL (ref 30.0–36.0)
MCV: 82.2 fL (ref 80.0–100.0)
Platelets: 142 10*3/uL — ABNORMAL LOW (ref 150–400)
RBC: 4.37 MIL/uL (ref 4.22–5.81)
RDW: 15.9 % — ABNORMAL HIGH (ref 11.5–15.5)
WBC: 5.7 10*3/uL (ref 4.0–10.5)
nRBC: 0 % (ref 0.0–0.2)

## 2019-08-29 LAB — BASIC METABOLIC PANEL
Anion gap: 13 (ref 5–15)
BUN: 21 mg/dL (ref 8–23)
CO2: 32 mmol/L (ref 22–32)
Calcium: 8.8 mg/dL — ABNORMAL LOW (ref 8.9–10.3)
Chloride: 90 mmol/L — ABNORMAL LOW (ref 98–111)
Creatinine, Ser: 1.15 mg/dL (ref 0.61–1.24)
GFR calc Af Amer: >60 mL/min (ref >60)
GFR calc non Af Amer: >60 mL/min (ref >60)
Glucose, Bld: 120 mg/dL — ABNORMAL HIGH (ref 70–99)
Potassium: 4.3 mmol/L (ref 3.5–5.1)
Sodium: 135 mmol/L (ref 135–145)

## 2019-08-29 NOTE — Progress Notes (Signed)
PROGRESS NOTE  Charles Gray OVZ:[id number] DOB: 02/19/1956 DOA: 08/23/2019 PCP: Enid Skeens., MD  Brief History   65 year old male with past medical history of diastolic congestive heart failure, chronic pain syndrome, cirrhosis with identified hypertensive gastropathy on EGD 02/2019, essential hypertension, pulmonary embolism diagnosed 2/21 on Eliquis therapy, obesity who presents to [facility] emergency department with complaints of shortness of breath. Patient recently hospitalized here for newly diagnosed congestive heart failure from late April and discharged in early May. Patient last followed up with Dr. Harriet Masson with Cardiology 5/7where patient was placed on a short course of Levaquin due to increasing redness of the bilateral lower extremities and concern for cellulitis. Patient indicates progressively worsening shortness of breath with exertion, lower extremity edema and orthopnea. Patient admits to poor compliance with dietary recommendations and lifestyle recommendations as outlined at previous hospitalization. Upon evaluation in the emergency department, patient received x1 dose of 80 mg of IV Lasix. Troponin was performed and was unremarkable. BNP was found to be elevated at 266 compared to 13.72 weeks ago. COVID-19 testing was performed and was found to be negative. Patient was also found to be exhibiting a mild lactic acidosis of 2.3. He continues to complain of diarrhea. Will check enteric pathogen panel.  Consultants  . Cardiology  Procedures  . None  Antibiotics   Anti-infectives (From admission, onward)   None     Subjective  The patient states that he feels a little better but continues to have large loose BM's. Poor appetite.  Objective   Vitals:  Vitals:   08/29/19 0751 08/29/19 1109  BP: 103/61 118/64  Pulse: 66 71  Resp: 17 19  Temp: 97.6 F (36.4 C) 98.1 F (36.7 C)  SpO2: 99% 98%   Exam:  Constitutional:  . The patient is awake,  alert, and oriented x 3. Mild distress from abdominal cramping. Respiratory:  . No increased work of breathing. . No wheezes, rales, or rhonchi . No tactile fremitus Cardiovascular:  . Regular rate and rhythm . No murmurs, ectopy, or gallups. . No lateral PMI. No thrills. Abdomen:  . Abdomen is soft, non-tender, mildly distended . Tympanic to percussion . No hernias, masses, or organomegaly . Hyperactive bowel sounds.  Musculoskeletal:  . No cyanosis or clubbing . +2 pitting edema of lower extremities bilaterally with some wrinkling.  Skin:  . No rashes, lesions, ulcers . palpation of skin: no induration or nodules Neurologic:  . CN 2-12 intact . Sensation all 4 extremities intact Psychiatric:  . Mental status o Mood, affect appropriate o Orientation to person, place, time  . judgment and insight appear intact  I have personally reviewed the following:   Today's Data  . Vitals, BMP, CBC  Micro Data  . Blood cultures x 2: No growth.  Cardiology Data  . EKG 08/25/2019: Sinus tachycardia with LFB Scheduled Meds: . apixaban  5 mg Oral BID  . buprenorphine-naloxone  1 tablet Sublingual BID  . gabapentin  800 mg Oral TID  . lisinopril  5 mg Oral Daily  . pantoprazole  40 mg Oral BID  . pravastatin  20 mg Oral Daily  . sodium chloride flush  3 mL Intravenous Once  . sodium chloride flush  3 mL Intravenous Q12H  . spironolactone  25 mg Oral Daily  . torsemide  20 mg Oral BID   Continuous Infusions: . sodium chloride      Active Problems:   Mixed hyperlipidemia   Chronic pain syndrome   Hyponatremia  Class 2 severe obesity due to excess calories with serious comorbidity and body mass index (BMI) of 39.0 to 39.9 in adult [facility])   History of pulmonary embolism   Acute on chronic diastolic CHF (congestive heart failure) (HCC)   Redness and swelling of lower leg   Chest pain   Lactic acidosis   Heart failure, systolic, acute (HCC)   Acute on chronic congestive  heart failure (HCC)   LOS: 5 days   A & P  Acute on chronic diastolic CHF (congestive heart failure), POA: Pt presented with orthopnea, dyspnea with exertion, edema, weight gain. He had very poor dietary compliance with newly diagnosed heart failure. Pt is currently on demadex and aldactone. Cardiology recommended continued monitoring for hypoxia/hypotension, and AKI. The patient is currently negative 6.718 L in terms of fluid balance.    Atypical chest pain-likely epigastric/GI in nature: Unlikely ACS per cardiology. Pain responded to a GI cocktail with no recurrences. No ischemic changes on EKG. PPI at discharge.  Lactic Acidosis: Resolved. Likely due to CHF. 2.3 on 08/24/2019. Will recheck for resolution.  Mixed hyperlipidemia: Continue home statin  Diarrhea: Thought to be due to cathartic given 2 days ago for constipation, although it continues. I will check an enteric pathogen panel. If negative will start immodium.  Chronic pain syndrome: Continue home regimen of Suboxone as  confirmed via the controlled substance database at admission.  Hyponatremia, hypervolemic in nature: Resolved. Na of 135 today. Monitor.  History of pulmonary embolism: Recently diagnosed pulmonary embolism and February 2021. Continue home Eliquis as at home.  Redness and swelling of lower leg: Changes noted at this time are consistent with venous stasis dermatitis. The skin in not warm to touch or actually erythematous. There is evidence of brawny edema. The patient states that he has tried compression socks before, but that he was unable to get them on. I will try to order size appropriate pair for the patient.  Class 2 severe obesity due to excess calories with serious comorbidity and body mass index (BMI) of 39.0 to 39.9 in adult [facility]):  Lengthy counseling at bedside daily on caloric restriction and regular physical activity. Obesity complicates all aspects of care.  I have seen and examined this  patient myself. I have spent 34 minutes in his evaluation and care.  DVT prophylaxis: [id number]  Code Status: full   Family Communication: None available   Disposition: The patient is from home. Anticipate discharge to home. Barrier to discharge is patient's ongoing severe diarrhea. Will check enteric pathogen panel. Nasreen Goedecke, DO [facility] Direct contact: see [email or web address]  7PM-7AM contact night coverage as above 08/29/2019, 3:03 PM  LOS: 4 days

## 2019-08-29 NOTE — Progress Notes (Signed)
Occupational Therapy Evaluation Patient Details Name: Charles Gray MRN: [id number] DOB: 02-Nov-1955 Today's Date: 08/29/2019    History of Present Illness 64 year old male with past medical history of diastolic congestive heart failure, chronic pain syndrome, cirrhosis with identified hypertensive gastropathy on EGD 02/2019, essential hypertension, pulmonary embolism diagnosed 2/21 on Eliquis therapy, obesity who presents to [facility] emergency department with complaints of shortness of breath.   Clinical Impression   Prior to admission, pt was not driving, cooked, had groceries delivered, and performed all ADLs/IADls and functional mobility with RW support. Pt's son works but is available to assist as needed with ADLs/IADLs. Pt admitted for above and limited by decreased balance, decreased endurance, decreased sensation, and decreased muscle strength. Today, pt did not c/o of pain but c/o decreased sensation in bilateral feet (ongoing for 3 years). Pt reports being 70% back to baseline in regards to self-care and functional mobility. Assessment of functional transfers/mobility, dynamic sitting/standing balance, BUE strength/ROM, and ADL function including LB dressing, grooming, and toileting. Pt required min guard-supervision for functional transfers using RW. Pt donned/doffed bilateral socks independently sitting EOB unsupported. Pt stood at sink to wash face/hands with min guard at waistline for safety/balance using RW for support. TED hose not in room or donned on BLEs upon arrival, nursing notified. Educated pt on transportation options for the future. OT recommends HHOT. Will continue to follow pt acutely.        Follow Up Recommendations  Home health OT;Supervision - Intermittent    Equipment Recommendations  Other (comment)(grab bars in restroom)    Recommendations for Other Services       Precautions / Restrictions Precautions Precaution Comments: mod  fall Restrictions Weight Bearing Restrictions: No      Mobility Bed Mobility               General bed mobility comments: pt received sitting EOB unsupported  Transfers Overall transfer level: Needs assistance Equipment used: Rolling walker (2 wheeled) Transfers: Sit to/from Stand Sit to Stand: Supervision         General transfer comment: required extended time for sit>stand using RW for support; bed placed in lower position to mimick home environment    Balance Overall balance assessment: Needs assistance Sitting-balance support: Feet supported Sitting balance-Leahy Scale: Good     Standing balance support: Bilateral upper extremity supported;During functional activity Standing balance-Leahy Scale: Good Standing balance comment: reliant on RW for support; good hand placement during functional transfers                           ADL either performed or assessed with clinical judgement   ADL Overall ADL's : Needs assistance/impaired Eating/Feeding: Independent   Grooming: Wash/dry hands;Wash/dry face;Independent;Supervision/safety;Standing Grooming Details (indicate cue type and reason): RW for support Upper Body Bathing: Set up   Lower Body Bathing: Set up   Upper Body Dressing : Modified independent   Lower Body Dressing: Min guard;Cueing for safety;Sit to/from stand   Toilet Transfer: Supervision/safety;Cueing for safety;Ambulation;RW;Grab bars           Functional mobility during ADLs: Min guard;Cueing for safety;Rolling walker General ADL Comments: pt utilizes RW for support when performing BADLs; extended time required for more complex tasks due to decreased strength/ROM/sensation     Vision         Perception     Praxis      Pertinent Vitals/Pain Pain Assessment: No/denies pain Faces Pain Scale: No hurt Pain Location: back  with ambulation Pain Descriptors / Indicators: Sore Pain Intervention(s): Monitored during  session;Repositioned     Hand Dominance Right   Extremity/Trunk Assessment Upper Extremity Assessment Upper Extremity Assessment: Overall WFL for tasks assessed   Lower Extremity Assessment Lower Extremity Assessment: Defer to PT evaluation   Cervical / Trunk Assessment Cervical / Trunk Assessment: Normal   Communication Communication Communication: No difficulties   Cognition Arousal/Alertness: Awake/alert Behavior During Therapy: WFL for tasks assessed/performed Overall Cognitive Status: Within Functional Limits for tasks assessed                                 General Comments: able to advocate for himself and meet needs   General Comments  pt reports being 70% back to baseline in regards to self-care, ADLs/IADLs, and functional mobility    Exercises     Shoulder Instructions      Home Living Family/patient expects to be discharged to:: Private residence Living Arrangements: Alone Available Help at Discharge: Family;Available PRN/intermittently Type of Home: Apartment Home Access: Stairs to enter Entrance Stairs-Number of Steps: 16 steps Entrance Stairs-Rails: Right;Left;Can reach both Home Layout: One level     Bathroom Shower/Tub: Teacher, early years/pre: Standard Bathroom Accessibility: Yes How Accessible: Accessible via walker Home Equipment: Tub bench;Walker - 2 wheels;Other (comment);Hand held shower head   Additional Comments: can go stay with son on one level, but he does not want to      Prior Functioning/Environment Level of Independence: Independent with assistive device(s)        Comments: Does not drive, cooks, [employer]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), uses walker intermittendly        OT Problem List: Decreased strength;Decreased activity tolerance;Impaired balance (sitting and/or standing);Obesity      OT Treatment/Interventions: Self-care/ADL training;Therapeutic exercise;Energy  conservation;Therapeutic activities;Balance training    OT Goals(Current goals can be found in the care plan section) Acute Rehab OT Goals Patient Stated Goal: to return home; to take a shower OT Goal Formulation: With patient Time For Goal Achievement: 09/12/19 Potential to Achieve Goals: Good  OT Frequency: Min 2X/week   Barriers to D/C: Other (comment)(16 stairs to enter apartment may create a barrier)          Co-evaluation              AM-PAC OT "6 Clicks" Daily Activity     Outcome Measure Help from another person eating meals?: None Help from another person taking care of personal grooming?: None Help from another person toileting, which includes using toliet, bedpan, or urinal?: A Little Help from another person bathing (including washing, rinsing, drying)?: A Little Help from another person to put on and taking off regular upper body clothing?: None Help from another person to put on and taking off regular lower body clothing?: A Little 6 Click Score: 21   End of Session Equipment Utilized During Treatment: Gait belt;Rolling walker Nurse Communication: Mobility status;Other (comment)(TED hose not present in room or donned on pt, nurse notified)  Activity Tolerance: Patient tolerated treatment well Patient left: in chair;with call bell/phone within reach  OT Visit Diagnosis: Unsteadiness on feet (R26.81);Muscle weakness (generalized) (M62.81)                Time: 6578-4696 OT Time Calculation (min): 27 min Charges:  OT General Charges $OT Visit: 1 Visit OT Evaluation $OT Eval Moderate Complexity: 1 Mod OT Treatments $Self Care/Home Management : 8-22  mins  Michel Bickers, OTR/L [facility] [place] 08/29/2019, 1:54 PM

## 2019-08-29 NOTE — Progress Notes (Signed)
Central telemetry notified nursing pt had questionable ectopy on telemetry @ 0008.   Pt SR in  V lead. Pt was standing in room looking across the hall without c/o.  Pt requesting a cup of coffee.  Will continue to monitor pt closely.

## 2019-08-29 NOTE — Discharge Instructions (Signed)
Information on my medicine - ELIQUIS (apixaban)  This medication education was reviewed with me or my healthcare representative as part of my discharge preparation.  The pharmacist that spoke with me during my hospital stay was:  Tomasina Keasling, RPH-CPP  Why was Eliquis prescribed for you? Eliquis was prescribed to treat blood clots that may have been found in the veins of your legs (deep vein thrombosis) or in your lungs (pulmonary embolism) and to reduce the risk of them occurring again.  What do You need to know about Eliquis ? Continue apixaban 5 mg tablet taken TWICE daily.  Eliquis may be taken with or without food.   Try to take the dose about the same time in the morning and in the evening. If you have difficulty swallowing the tablet whole please discuss with your pharmacist how to take the medication safely.  Take Eliquis exactly as prescribed and DO NOT stop taking Eliquis without talking to the doctor who prescribed the medication.  Stopping may increase your risk of developing a new blood clot.  Refill your prescription before you run out.  After discharge, you should have regular check-up appointments with your healthcare provider that is prescribing your Eliquis.    What do you do if you miss a dose? If a dose of ELIQUIS is not taken at the scheduled time, take it as soon as possible on the same day and twice-daily administration should be resumed. The dose should not be doubled to make up for a missed dose.  Important Safety Information A possible side effect of Eliquis is bleeding. You should call your healthcare provider right away if you experience any of the following: ? Bleeding from an injury or your nose that does not stop. ? Unusual colored urine (red or dark brown) or unusual colored stools (red or black). ? Unusual bruising for unknown reasons. ? A serious fall or if you hit your head (even if there is no bleeding).  Some medicines may interact with Eliquis  and might increase your risk of bleeding or clotting while on Eliquis. To help avoid this, consult your healthcare provider or pharmacist prior to using any new prescription or non-prescription medications, including herbals, vitamins, non-steroidal anti-inflammatory drugs (NSAIDs) and supplements.  This website has more information on Eliquis (apixaban): http://www.eliquis.com/eliquis/home   

## 2019-08-29 NOTE — Evaluation (Signed)
Physical Therapy Evaluation Patient Details Name: Charles Gray MRN: [id number] DOB: May 18, 1955 Today's Date: 08/29/2019   History of Present Illness  64 year old male with past medical history of diastolic congestive heart failure, chronic pain syndrome, cirrhosis with identified hypertensive gastropathy on EGD 02/2019, essential hypertension, pulmonary embolism diagnosed 2/21 on Eliquis therapy, obesity who presents to [facility] emergency department with complaints of shortness of breath.  Clinical Impression  Patient received in recliner, finishing OT evaluation. He is agreeable to PT. Reports he is feeling okay, not sure when he is going home. Patient performed sit to stand transfer with supervision. He is able to ambulate 150 feet with RW and supervision. He ambulates with flexed posture due to having narrow tall walker. He has bariatric RW at home. He requires 2 brief standing rest breaks due to fatigue and back pain. He will continue to benefit from skilled PT while here to improve functional mobility and endurance.     Follow Up Recommendations No PT follow up    Equipment Recommendations  None recommended by PT    Recommendations for Other Services       Precautions / Restrictions Precautions Precaution Comments: mod fall Restrictions Weight Bearing Restrictions: No      Mobility  Bed Mobility               General bed mobility comments: patient received up in recliner  Transfers Overall transfer level: Needs assistance Equipment used: Rolling walker (2 wheeled) Transfers: Sit to/from Stand Sit to Stand: Supervision            Ambulation/Gait Ambulation/Gait assistance: Supervision Gait Distance (Feet): 150 Feet Assistive device: Rolling walker (2 wheeled) Gait Pattern/deviations: Trunk flexed;Decreased step length - right;Decreased step length - left;Decreased stride length Gait velocity: decr   General Gait Details: requires occasional  brief standing rest breaks due to fatigue and back soreness. [name] is too short and too narrow which forces him to walk with flexed posture.  Stairs            Wheelchair Mobility    Modified Rankin (Stroke Patients Only)       Balance Overall balance assessment: Needs assistance Sitting-balance support: Feet supported Sitting balance-Leahy Scale: Good     Standing balance support: Bilateral upper extremity supported;During functional activity Standing balance-Leahy Scale: Good Standing balance comment: reliant on RW. He is able to walk a few feet without ad, but pace is slowed and he is less steady.                             Pertinent Vitals/Pain Pain Assessment: Faces Faces Pain Scale: Hurts a little bit Pain Location: back with ambulation Pain Descriptors / Indicators: Sore Pain Intervention(s): Monitored during session;Repositioned    Home Living Family/patient expects to be discharged to:: Private residence Living Arrangements: Alone Available Help at Discharge: Family;Available PRN/intermittently Type of Home: Apartment Home Access: Stairs to enter Entrance Stairs-Rails: Right;Left;Can reach both(carries walker up steps) Entrance Stairs-Number of Steps: 16 steps Home Layout: One level Home Equipment: Tub bench;Walker - 2 wheels;Other (comment);Hand held shower head      Prior Function Level of Independence: Independent with assistive device(s)         Comments: Does not drive, cooks, [employer]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), uses walker intermittenlty     Hand Dominance   Dominant Hand: Right    Extremity/Trunk Assessment   Upper Extremity Assessment Upper Extremity Assessment: Defer to  OT evaluation    Lower Extremity Assessment Lower Extremity Assessment: Generalized weakness    Cervical / Trunk Assessment Cervical / Trunk Assessment: Normal  Communication   Communication: No difficulties  Cognition  Arousal/Alertness: Awake/alert Behavior During Therapy: WFL for tasks assessed/performed Overall Cognitive Status: Within Functional Limits for tasks assessed                                        General Comments      Exercises     Assessment/Plan    PT Assessment Patient needs continued PT services  PT Problem List Decreased strength;Decreased mobility;Decreased activity tolerance;Pain       PT Treatment Interventions Therapeutic exercise;Gait training;Stair training;Functional mobility training;Therapeutic activities;Patient/family education    PT Goals (Current goals can be found in the Care Plan section)  Acute Rehab PT Goals Patient Stated Goal: to return home PT Goal Formulation: With patient Time For Goal Achievement: 09/05/19 Potential to Achieve Goals: Good    Frequency Min 3X/week   Barriers to discharge Inaccessible home environment 16 steps to enter home    Co-evaluation               AM-PAC PT "6 Clicks" Mobility  Outcome Measure Help needed turning from your back to your side while in a flat bed without using bedrails?: A Little Help needed moving from lying on your back to sitting on the side of a flat bed without using bedrails?: A Little Help needed moving to and from a bed to a chair (including a wheelchair)?: None Help needed standing up from a chair using your arms (e.g., wheelchair or bedside chair)?: None Help needed to walk in hospital room?: None Help needed climbing 3-5 steps with a railing? : A Little 6 Click Score: 21    End of Session   Activity Tolerance: Patient limited by fatigue;Patient limited by pain Patient left: in chair;with call bell/phone within reach Nurse Communication: Mobility status PT Visit Diagnosis: Muscle weakness (generalized) (M62.81);Difficulty in walking, not elsewhere classified (R26.2);Pain Pain - part of body: (back)    Time: 8138-8719 PT Time Calculation (min) (ACUTE ONLY): 17  min   Charges:   PT Evaluation $PT Eval Moderate Complexity: 1 Mod PT Treatments $Gait Training: 8-22 mins        Chue Berkovich, PT, GCS 08/29/19,10:44 AM

## 2019-08-30 LAB — CBC WITH DIFFERENTIAL/PLATELET
Abs Immature Granulocytes: 0.02 10*3/uL (ref 0.00–0.07)
Basophils Absolute: 0 10*3/uL (ref 0.0–0.1)
Basophils Relative: 1 %
Eosinophils Absolute: 0.3 10*3/uL (ref 0.0–0.5)
Eosinophils Relative: 4 %
HCT: 34.9 % — ABNORMAL LOW (ref 39.0–52.0)
Hemoglobin: 10.7 g/dL — ABNORMAL LOW (ref 13.0–17.0)
Immature Granulocytes: 0 %
Lymphocytes Relative: 20 %
Lymphs Abs: 1.3 10*3/uL (ref 0.7–4.0)
MCH: 25.2 pg — ABNORMAL LOW (ref 26.0–34.0)
MCHC: 30.7 g/dL (ref 30.0–36.0)
MCV: 82.1 fL (ref 80.0–100.0)
Monocytes Absolute: 0.7 10*3/uL (ref 0.1–1.0)
Monocytes Relative: 10 %
Neutro Abs: 4.2 10*3/uL (ref 1.7–7.7)
Neutrophils Relative %: 65 %
Platelets: 129 10*3/uL — ABNORMAL LOW (ref 150–400)
RBC: 4.25 MIL/uL (ref 4.22–5.81)
RDW: 16 % — ABNORMAL HIGH (ref 11.5–15.5)
WBC: 6.5 10*3/uL (ref 4.0–10.5)
nRBC: 0 % (ref 0.0–0.2)

## 2019-08-30 LAB — BASIC METABOLIC PANEL
Anion gap: 12 (ref 5–15)
BUN: 18 mg/dL (ref 8–23)
CO2: 27 mmol/L (ref 22–32)
Calcium: 8.9 mg/dL (ref 8.9–10.3)
Chloride: 96 mmol/L — ABNORMAL LOW (ref 98–111)
Creatinine, Ser: 1.04 mg/dL (ref 0.61–1.24)
GFR calc Af Amer: >60 mL/min (ref >60)
GFR calc non Af Amer: >60 mL/min (ref >60)
Glucose, Bld: 100 mg/dL — ABNORMAL HIGH (ref 70–99)
Potassium: 3.7 mmol/L (ref 3.5–5.1)
Sodium: 135 mmol/L (ref 135–145)

## 2019-08-30 MED ORDER — SPIRONOLACTONE 25 MG PO TABS: 25.0000 mg | ORAL_TABLET | Freq: Every day | ORAL | 0 refills | Status: DC

## 2019-08-30 MED ORDER — TORSEMIDE 20 MG PO TABS: 20.0000 mg | ORAL_TABLET | Freq: Two times a day (BID) | ORAL | 0 refills | Status: DC

## 2019-08-30 MED FILL — SPIRONOLACTONE 25 MG TABLET: 25 | 30 days supply | Qty: 30 | Fill #0

## 2019-08-30 MED FILL — TORSEMIDE 20 MG TABLET: 20 | 30 days supply | Qty: 60 | Fill #0

## 2019-08-30 NOTE — Plan of Care (Signed)

## 2019-08-30 NOTE — Discharge Summary (Signed)
Physician Discharge Summary  Charles Gray DVV:[id number] DOB: 09-01-1955 DOA: 08/23/2019  PCP: Enid Skeens., MD  Admit date: 08/23/2019 Discharge date: 08/30/2019  Recommendations for Outpatient Follow-up:  1. Discharge to home with [facility] PT/RN/SW 2. Follow up with PCP in 7-10 days. 3. Have chemistry drawn at visit with PCP 4. Follow up with cardiology as directed. 5. Follow up with CHF clinic  Follow-up Information    [place] Follow up on 09/05/2019.   Specialty: Cardiology Why: Please arrive 15 minutes early for your 3:30pm post-hospital advanced cardiology follow-up appointment with Lyda Jester, PA-C. The parking code is [id number]. Please call the office for directions.  Contact information: [address] [id number] [place] [phone]       [facility] Follow [facility].   Specialty: Home Health Services Why: Registered Nurse, Physical Therapy, Social Worker- office to call with visit times. If you have questions please call [phone]         Discharge Diagnoses: Principal diagnosis is #1 1. Acute on chronic diastolic CHF 2. Atypical chest pain 3. Hyponatremia 4. Lactic acidosiis 5. Hyperlipidemia 6. Chronic pain 7. Morbid Obesity II 8. Venous stasis dermatitis. 9. Diarrhea  Discharge Condition: Fair  Disposition: Home with home health PT/RN/SW  Diet recommendation: Heart healthy with modified carbohydrates  Filed Weights   08/28/19 0259 08/29/19 0300 08/30/19 0324  Weight: (!) 168.8 kg (!) 170.9 kg (!) 171.2 kg    History of present illness:  64 year old male with past medical history of diastolic congestive heart failure, chronic pain syndrome, cirrhosis with identified hypertensive gastropathy on EGD 02/2019, essential hypertension, pulmonary embolism diagnosed 2/21 on Eliquis therapy, obesity who presents to [facility] emergency department with  complaints of shortness of breath.  Of note, patient was recently admitted to [facility] cardiology service for acute on chronic congestive heart failure from late April and discharged in early May.  Patient last followed up with Dr. Harriet Masson with Cardiology 5/7 where patient was placed on a short course of Levaquin due to increasing redness of the bilateral lower extremities and concern for cellulitis.  Patient explains that over the past week patient has been experiencing progressively worsening bilateral lower extremity swelling.  In addition to the swelling, patient has been experiencing increasing pain of the bilateral lower extremities.  Patient describes this pain as tight in quality, moderate in intensity and nonradiating.  Patient denies any alleviating or exacerbating factors.  As patient's bilateral lower extremity edema continue to progress, patient is also complaining of increasing abdominal girth and increasing weight gain.  Patient reports an increase in weight from 369pounds to 382 pounds at home.  Patient also endorses a 1 to 2-day history of progressively worsening shortness of breath.  Upon further questioning, patient denies noncompliance with his low-salt diet and denies noncompliance with his medication regimen.  Patient denies cough, fever, sick contacts or confirmed contact with COVID-19.  Patient symptoms continue to worsen until this morning when the patient's home health nurse noted the patient's shortness of breath and progressive edema and called EMS who promptly brought the patient into [facility] emergency department for evaluation.  Upon evaluation in the emergency department, patient was felt to be clinically suffered from acute on chronic congestive heart failure and administer 80 mg of IV Lasix.  Troponin was performed and was unremarkable.  BNP was found to be elevated at 266 compared to 13.72 weeks ago.  COVID-19 testing was  performed and was found to be  negative.  Patient was also found to be exhibiting a mild lactic acidosis of 2.3.  Hospital Course:  64 year old male with past medical history of diastolic congestive heart failure, chronic pain syndrome, cirrhosis with identified hypertensive gastropathy on EGD 02/2019, essential hypertension, pulmonary embolism diagnosed 2/21 on Eliquis therapy, obesity who presents to [facility] emergency department with complaints of shortness of breath. Patient recently hospitalized here for newly diagnosed congestive heart failure from late April and discharged in early May. Patient last followed up with Dr. Harriet Masson with Cardiology 5/7where patient was placed on a short course of Levaquin due to increasing redness of the bilateral lower extremities and concern for cellulitis. Patient indicates progressively worsening shortness of breath with exertion, lower extremity edema and orthopnea. Patient admits to poor compliance with dietary recommendations and lifestyle recommendations as outlined at previous hospitalization. Upon evaluation in the emergency department, patient received x1 dose of 80 mg of IV Lasix. Troponin was performed and was unremarkable. BNP was found to be elevated at 266 compared to 13.72 weeks ago. COVID-19 testing was performed and was found to be negative. Patient was also found to be exhibiting a mild lactic acidosis of 2.3. He continues to complain of diarrhea. Today the patient's diarrhea and abdominal discomfort has resolved. He has been ordered a pair of fitted compression stockings. He will be discharged to home with home health PT/RN/SW.  Today's assessment: S: The patient is resting comfortably. No new complaints. O: Vitals:  Vitals:   08/30/19 0940 08/30/19 1107  BP: (!) 143/84 108/63  Pulse:  79  Resp:  17  Temp:  98 F (36.7 C)  SpO2:  97%   Exam:  Constitutional:  The patient is awake, alert, and oriented x 3. No acute distress Respiratory:   No  increased work of breathing.  No wheezes, rales, or rhonchi  No tactile fremitus Cardiovascular:   Regular rate and rhythm  No murmurs, ectopy, or gallups.  No lateral PMI. No thrills. Abdomen:   Abdomen is soft, non-tender, mildly distended  Tympanic to percussion  No hernias, masses, or organomegaly  Hyperactive bowel sounds.  Musculoskeletal:   No cyanosis or clubbing  +1 pitting edema of lower extremities bilaterally with some wrinkling.  Skin:   No rashes, lesions, ulcers  palpation of skin: no induration or nodules Neurologic:   CN 2-12 intact  Sensation all 4 extremities intact Psychiatric:   Mental status ? Mood, affect appropriate ? Orientation to person, place, time   judgment and insight appear intact   Discharge Instructions  Discharge Instructions    (HEART FAILURE PATIENTS) Call MD:  Anytime you have any of the following symptoms: 1) 3 pound weight gain in 24 hours or 5 pounds in 1 week 2) shortness of breath, with or without a dry hacking cough 3) swelling in the hands, feet or stomach 4) if you have to sleep on extra pillows at night in order to breathe.   Complete by: As directed    AMB referral to CHF clinic   Complete by: As directed    Avoid straining   Complete by: As directed    Call MD for:  difficulty breathing, headache or visual disturbances   Complete by: As directed    Call MD for:  persistant dizziness or light-headedness   Complete by: As directed    Call MD for:  redness, tenderness, or signs of infection (pain, swelling, redness, odor or green/yellow discharge around incision  site)   Complete by: As directed    Diet - low sodium heart healthy   Complete by: As directed    Diet Carb Modified   Complete by: As directed    Discharge instructions   Complete by: As directed    Discharge to home with [facility] PT/RN/SW Follow up with PCP in 7-10 days. Have chemistry drawn at visit with PCP Follow up with cardiology as  directed. Follow up with CHF clinic Heart healthy diet with modified carbohydrates. Wear compression stockings daily. Keep lower extremities elevated as much as possible.   Face-to-face encounter (required for Medicare/Medicaid patients)   Complete by: As directed    I Amberrose Friebel certify that this patient is under my care and that I, or a nurse practitioner or physician's assistant working with me, had a face-to-face encounter that meets the physician face-to-face encounter requirements with this patient on 08/30/2019. The encounter with the patient was in whole, or in part for the following medical condition(s) which is the primary reason for home health care (List medical condition): Acute on chronic diastolic heart failure, atypical chest pain, hyponatremia, lactic acidosis, Venous stasis, chronic pain, Morbid obesity,   The encounter with the patient was in whole, or in part, for the following medical condition, which is the primary reason for home health care: Acute on chronic diastolic heart failure, atypical chest pain, hyponatremia, lactic acidosis, Venous stasis, chronic pain, Morbid obesity,   I certify that, based on my findings, the following services are medically necessary home health services:  Nursing Physical therapy     Reason for Medically Necessary Home Health Services:  Skilled Nursing- Skilled Assessment/Observation Skilled Nursing- Change/Decline in Patient Status Skilled Nursing- Changes in Medication/Medication Management Skilled Nursing- Assessment and Observation of Wound Status Therapy- Therapeutic Exercises to Increase Strength and Endurance Therapy- Personnel officer, Public librarian Therapy- Instruction on use of Assistive Device for Ambulation on all Surfaces Therapy- Home Adaptation to Facilitate Safety     My clinical findings support the need for the above services: Unable to leave home safely without assistance and/or assistive device    Further, I certify that my clinical findings support that this patient is homebound due to: Unable to leave home safely without assistance   Heart Failure patients record your daily weight using the same scale at the same time of day   Complete by: As directed    Home Health   Complete by: As directed    To provide the following care/treatments:  PT RN Social work     Increase activity slowly   Complete by: As directed    STOP any activity that causes chest pain, shortness of breath, dizziness, sweating, or exessive weakness   Complete by: As directed      Allergies as of 08/30/2019      Reactions   Penicillins    Did it involve swelling of the face/tongue/throat, SOB, or low BP? N/A Did it involve sudden or severe rash/hives, skin peeling, or any reaction on the inside of your mouth or nose? N/A Did you need to seek medical attention at a hospital or doctor's office?N/A When did it last happen?N/A If all above answers are "NO", may proceed with cephalosporin use.      Medication List    STOP taking these medications   diphenhydrAMINE 25 MG tablet Commonly known as: BENADRYL   furosemide 80 MG tablet Commonly known as: LASIX   levofloxacin 500 MG tablet Commonly known as: LEVAQUIN  metolazone 5 MG tablet Commonly known as: ZAROXOLYN   potassium chloride SA 20 MEQ tablet Commonly known as: KLOR-CON     TAKE these medications   acetaminophen 325 MG tablet Commonly known as: TYLENOL Take 2 tablets (650 mg total) by mouth every 4 (four) hours as needed for fever or mild pain.   apixaban 5 MG Tabs tablet Commonly known as: Eliquis Take 1 tablet (5 mg total) by mouth 2 (two) times daily.   Buprenorphine HCl-Naloxone HCl 8-2 MG Film Place 1 tablet under the tongue in the morning and at bedtime.   gabapentin 800 MG tablet Commonly known as: NEURONTIN Take 1 tablet (800 mg total) by mouth 3 (three) times daily.   lisinopril 5 MG tablet Commonly known as:  ZESTRIL Take 1 tablet (5 mg total) by mouth daily.   pantoprazole 40 MG tablet Commonly known as: PROTONIX Take 40 mg by mouth 2 (two) times daily.   polyethylene glycol 17 g packet Commonly known as: MIRALAX / GLYCOLAX Take 17 g by mouth daily. What changed:   when to take this  reasons to take this   pravastatin 20 MG tablet Commonly known as: PRAVACHOL Take 1 tablet (20 mg total) by mouth daily.   spironolactone 25 MG tablet Commonly known as: ALDACTONE Take 1 tablet (25 mg total) by mouth daily. Start taking on: Aug 31, 2019   torsemide 20 MG tablet Commonly known as: DEMADEX Take 1 tablet (20 mg total) by mouth 2 (two) times daily.   Ventolin HFA 108 (90 Base) MCG/ACT inhaler Generic drug: albuterol Inhale 1 puff into the lungs every 4 (four) hours as needed for shortness of breath.   zolpidem 10 MG tablet Commonly known as: AMBIEN Take 10 mg by mouth at bedtime as needed for sleep.      Allergies  Allergen Reactions  . Penicillins     Did it involve swelling of the face/tongue/throat, SOB, or low BP? N/A Did it involve sudden or severe rash/hives, skin peeling, or any reaction on the inside of your mouth or nose? N/A Did you need to seek medical attention at a hospital or doctor's office?N/A When did it last happen?N/A If all above answers are "NO", may proceed with cephalosporin use.    The results of significant diagnostics from this hospitalization (including imaging, microbiology, ancillary and laboratory) are listed below for reference.    Significant Diagnostic Studies: DG Chest 2 View  Result Date: 08/23/2019 CLINICAL DATA:  Shortness of breath EXAM: CHEST - 2 VIEW COMPARISON:  08/06/2019 FINDINGS: The heart size and mediastinal contours are within normal limits. Both lungs are clear. The visualized skeletal structures are unremarkable. IMPRESSION: No active cardiopulmonary disease. Electronically Signed   By: Kathreen Devoid   On: 08/23/2019 12:58    DG Chest 2 View  Result Date: 08/06/2019 CLINICAL DATA:  64 year old male with history of shortness of breath. EXAM: CHEST - 2 VIEW COMPARISON:  Chest x-ray 06/26/2019. FINDINGS: Lung volumes are normal. No consolidative airspace disease. No pleural effusions. No pneumothorax. No pulmonary nodule or mass noted. Pulmonary vasculature and the cardiomediastinal silhouette are within normal limits. Multiple old healed right-sided rib fractures are incidentally noted. IMPRESSION: 1.  No radiographic evidence of acute cardiopulmonary disease. Electronically Signed   By: Vinnie Langton M.D.   On: 08/06/2019 07:34   CARDIAC CATHETERIZATION  Result Date: 08/09/2019 1. Normal filling pressures. 2. Normal PA pressure. 3. Normal cardiac output.   ECHOCARDIOGRAM COMPLETE  Result Date: 08/07/2019  ECHOCARDIOGRAM REPORT   Patient Name:   LINDSEY HOMMEL Date of Exam: 08/07/2019 Medical Rec #:  [id number]       Height:       82.0 in Accession #:    [id number]      Weight:       383.6 lb Date of Birth:  1956/02/17       BSA:          3.088 m Patient Age:    55 years        BP:           133/83 mmHg Patient Gender: M               HR:           70 bpm. Exam Location:  Inpatient Procedure: 2D Echo and Intracardiac Opacification Agent Indications:    Pulmonary hypertension 416.8 / I27.2  History:        Patient has no prior history of Echocardiogram examinations,                 most recent 06/08/2019. CHF; Risk Factors:Sleep Apnea,                 Hypertension and Dyslipidemia. Cirrhosis of the liver, Acute                 pulmonary embolism 2/21, Thrombocytopenia, Edema.  Sonographer:    Darlina Sicilian RDCS Referring Phys: [id number] Darreld Mclean  Sonographer Comments: Technically difficult study due to poor echo windows and suboptimal apical window. Echo performed with the patient upright for comfort. IMPRESSIONS  1. Suboptimal image quality for diagnostic purposes.  2. Left ventricular ejection fraction, by  estimation, is 55 to 60%. The left ventricle has normal function. Left ventricular endocardial border not optimally defined to evaluate regional wall motion. Left ventricular diastolic parameters are consistent with Grade I diastolic dysfunction (impaired relaxation).  3. Right ventricular systolic function was not well visualized. The right ventricular size is not well visualized.  4. The mitral valve is grossly normal. No evidence of mitral valve regurgitation. No evidence of mitral stenosis.  5. The aortic valve was not well visualized. Aortic valve regurgitation is not visualized. No aortic stenosis is present. FINDINGS  Left Ventricle: Left ventricular ejection fraction, by estimation, is 55 to 60%. The left ventricle has normal function. Left ventricular endocardial border not optimally defined to evaluate regional wall motion. Definity contrast agent was given IV to delineate the left ventricular endocardial borders. The left ventricular internal cavity size was normal in size. Left ventricular diastolic parameters are consistent with Grade I diastolic dysfunction (impaired relaxation). Right Ventricle: The right ventricular size is not well visualized. Right vetricular wall thickness was not assessed. Right ventricular systolic function was not well visualized. Left Atrium: Left atrial size was not well visualized. Right Atrium: Right atrial size was not well visualized. Pericardium: The pericardium was not well visualized. Presence of pericardial fat pad. Mitral Valve: The mitral valve is grossly normal. No evidence of mitral valve regurgitation. No evidence of mitral valve stenosis. Tricuspid Valve: The tricuspid valve is not well visualized. Tricuspid valve regurgitation is not demonstrated. Aortic Valve: The aortic valve was not well visualized. Aortic valve regurgitation is not visualized. No aortic stenosis is present. Pulmonic Valve: The pulmonic valve was not well visualized. Pulmonic valve  regurgitation is trivial. Aorta: Aortic root measures 37 mm which may be within normal limits for age when indexed to BSA. The aortic root  was not well visualized. Venous: The inferior vena cava was not well visualized. IAS/Shunts: The interatrial septum was not well visualized.  LEFT VENTRICLE PLAX 2D                 Diastology LVOT diam:     2.30 cm  LV e' lateral:   7.94 cm/s LVOT Area:     4.15 cm LV E/e' lateral: 7.5                         LV e' medial:    7.29 cm/s                         LV E/e' medial:  8.1   AORTA Ao Root diam: 3.70 cm MITRAL VALVE MV Area (PHT): 2.56 cm    SHUNTS MV Decel Time: 296 msec    Systemic Diam: 2.30 cm MV E velocity: 59.40 cm/s MV A velocity: 70.20 cm/s MV E/A ratio:  0.85 Cherlynn Kaiser MD Electronically signed by Cherlynn Kaiser MD Signature Date/Time: 08/07/2019/7:41:07 PM    Final    VAS [place] LOWER EXTREMITY VENOUS (DVT) (ONLY [facility] & [facility])  Result Date: 08/23/2019  Lower Venous DVTStudy Indications: Swelling.  Risk Factors: DVT. Limitations: Body habitus, poor ultrasound/tissue interface and patient pain tolerance. Comparison Study: 06/08/2019 - Left Popliteal DVT Performing Technologist: Oliver Hum RVT  Examination Guidelines: A complete evaluation includes B-mode imaging, spectral Doppler, color Doppler, and power Doppler as needed of all accessible portions of each vessel. Bilateral testing is considered an integral part of a complete examination. Limited examinations for reoccurring indications may be performed as noted. The reflux portion of the exam is performed with the patient in reverse Trendelenburg.  +---------+---------------+---------+-----------+----------+--------------+ RIGHT    CompressibilityPhasicitySpontaneityPropertiesThrombus Aging +---------+---------------+---------+-----------+----------+--------------+ CFV      Full           Yes      Yes                                  +---------+---------------+---------+-----------+----------+--------------+ SFJ      Full                                                        +---------+---------------+---------+-----------+----------+--------------+ FV Prox  Full                                                        +---------+---------------+---------+-----------+----------+--------------+ FV Mid   Full                                                        +---------+---------------+---------+-----------+----------+--------------+ FV DistalFull                                                        +---------+---------------+---------+-----------+----------+--------------+  PFV      Full                                                        +---------+---------------+---------+-----------+----------+--------------+ POP      Full           Yes      Yes                                 +---------+---------------+---------+-----------+----------+--------------+ PTV      Full                                                        +---------+---------------+---------+-----------+----------+--------------+ PERO     Full                                                        +---------+---------------+---------+-----------+----------+--------------+   +---------+---------------+---------+-----------+----------+--------------+ LEFT     CompressibilityPhasicitySpontaneityPropertiesThrombus Aging +---------+---------------+---------+-----------+----------+--------------+ CFV      Full           Yes      Yes                                 +---------+---------------+---------+-----------+----------+--------------+ SFJ      Full                                                        +---------+---------------+---------+-----------+----------+--------------+ FV Prox  Full                                                         +---------+---------------+---------+-----------+----------+--------------+ FV Mid   Full                                                        +---------+---------------+---------+-----------+----------+--------------+ FV DistalFull                                                        +---------+---------------+---------+-----------+----------+--------------+ PFV      Full                                                        +---------+---------------+---------+-----------+----------+--------------+  POP      Full           Yes      Yes                                 +---------+---------------+---------+-----------+----------+--------------+ PTV      Full                                                        +---------+---------------+---------+-----------+----------+--------------+ PERO                                                  Not visualized +---------+---------------+---------+-----------+----------+--------------+     Summary: RIGHT: - There is no evidence of deep vein thrombosis in the lower extremity. However, portions of this examination were limited- see technologist comments above.  - No cystic structure found in the popliteal fossa.  LEFT: - There is no evidence of deep vein thrombosis in the lower extremity. However, portions of this examination were limited- see technologist comments above.  - No cystic structure found in the popliteal fossa.  *See table(s) above for measurements and observations. Electronically signed by Curt Jews MD on 08/23/2019 at 7:28:18 PM.    Final     Microbiology: Recent Results (from the past 240 hour(s))  Culture, blood (routine x 2)     Status: None   Collection Time: 08/23/19  1:54 PM   Specimen: BLOOD RIGHT FOREARM  Result Value Ref Range Status   Specimen Description BLOOD RIGHT FOREARM  Final   Special Requests   Final    BOTTLES DRAWN AEROBIC AND ANAEROBIC Blood Culture results may not be optimal due to  an inadequate volume of blood received in culture bottles   Culture   Final    NO GROWTH 5 DAYS Performed at [facility], [place] [address], [place], [place] [address]    Report Status 08/28/2019 FINAL  Final  SARS Coronavirus 2 by RT PCR (hospital order, performed in [facility] hospital lab) Nasopharyngeal Nasopharyngeal Swab     Status: None   Collection Time: 08/23/19  3:41 PM   Specimen: Nasopharyngeal Swab  Result Value Ref Range Status   SARS Coronavirus 2 NEGATIVE NEGATIVE Final    Comment: (NOTE) SARS-CoV-2 target nucleic acids are NOT DETECTED. The SARS-CoV-2 RNA is generally detectable in upper and lower respiratory specimens during the acute phase of infection. The lowest concentration of SARS-CoV-2 viral copies this assay can detect is 250 copies / mL. A negative result does not preclude SARS-CoV-2 infection and should not be used as the sole basis for treatment or other patient management decisions.  A negative result may occur with improper specimen collection / handling, submission of specimen other than nasopharyngeal swab, presence of viral mutation(s) within the areas targeted by this assay, and inadequate number of viral copies (<250 copies / mL). A negative result must be combined with clinical observations, patient history, and epidemiological information. Fact Sheet for Patients:   StrictlyIdeas.no Fact Sheet for Healthcare Providers: BankingDealers.co.za This test is not yet approved or cleared  by the [place] FDA and has been authorized  for detection and/or diagnosis of SARS-CoV-2 by FDA under an Emergency Use Authorization (EUA).  This EUA will remain in effect (meaning this test can be used) for the duration of the COVID-19 declaration under Section 564(b)(1) of the Act, 21 U.S.C. section 360bbb-3(b)(1), unless the authorization is terminated or revoked sooner. Performed at [facility],  [place] [address] [place], [place] [address]   Culture, blood (routine x 2)     Status: None   Collection Time: 08/23/19  4:27 PM   Specimen: BLOOD  Result Value Ref Range Status   Specimen Description BLOOD SITE NOT SPECIFIED  Final   Special Requests   Final    BOTTLES DRAWN AEROBIC AND ANAEROBIC Blood Culture adequate volume   Culture   Final    NO GROWTH 5 DAYS Performed at [facility], [address], [place], [place] [address]    Report Status 08/28/2019 FINAL  Final     Labs: Basic Metabolic Panel: Recent Labs  Lab 08/24/19 0059 08/25/19 0557 08/26/19 0747 08/27/19 0411 08/28/19 0344 08/29/19 0417 08/30/19 0453  NA 133*   < > 132* 133* 133* 135 135  K 3.1*   < > 3.4* 3.2* 3.2* 4.3 3.7  CL 89*   < > 85* 88* 88* 90* 96*  CO2 30   < > 34* 33* 33* 32 27  GLUCOSE 148*   < > 126* 136* 157* 120* 100*  BUN 22   < > 29* 25* 23 21 18   CREATININE 1.42*   < > 1.11 1.10 1.18 1.15 1.04  CALCIUM 9.6   < > 9.4 9.2 8.8* 8.8* 8.9  MG 2.1  --   --  2.2  --   --   --    < > = values in this interval not displayed.   Liver Function Tests: No results for input(s): AST, ALT, ALKPHOS, BILITOT, PROT, ALBUMIN in the last 168 hours. No results for input(s): LIPASE, AMYLASE in the last 168 hours. No results for input(s): AMMONIA in the last 168 hours. CBC: Recent Labs  Lab 08/24/19 0059 08/25/19 0557 08/26/19 0747 08/27/19 0411 08/28/19 0344 08/29/19 0417 08/30/19 0453  WBC 10.9*   < > 9.0 7.2 7.2 5.7 6.5  NEUTROABS 8.7*  --   --   --   --   --  4.2  HGB 12.3*   < > 12.3* 10.9* 11.1* 10.9* 10.7*  HCT 39.1   < > 39.7 35.3* 36.5* 35.9* 34.9*  MCV 79.6*   < > 80.9 81.1 82.4 82.2 82.1  PLT 179   < > 191 158 157 142* 129*   < > = values in this interval not displayed.   Cardiac Enzymes: No results for input(s): CKTOTAL, CKMB, CKMBINDEX, TROPONINI in the last 168 hours. BNP: BNP (last 3 results) Recent Labs    06/26/19 1311 08/06/19 0554 08/23/19 1400  BNP 66.1 13.7 266.3*     ProBNP (last 3 results) Recent Labs    01/24/19 1121 02/27/19 1106  PROBNP 52 37    CBG: Recent Labs  Lab 08/24/19 1022  GLUCAP 120*    Active Problems:   Mixed hyperlipidemia   Chronic pain syndrome   Hyponatremia   Class 2 severe obesity due to excess calories with serious comorbidity and body mass index (BMI) of 39.0 to 39.9 in adult [facility])   History of pulmonary embolism   Acute on chronic diastolic CHF (congestive heart failure) (HCC)   Redness and swelling of  lower leg   Chest pain   Lactic acidosis   Heart failure, systolic, acute ([place])   Acute on chronic congestive heart failure ([place])   Time coordinating discharge: 38 minutes.  Signed:        Szymon Foiles, DO [facility]  08/30/2019, 3:57 PM

## 2019-08-31 DIAGNOSIS — M5126 Other intervertebral disc displacement, lumbar region: Secondary | ICD-10-CM | POA: Diagnosis not present

## 2019-08-31 DIAGNOSIS — I110 Hypertensive heart disease with heart failure: Secondary | ICD-10-CM | POA: Diagnosis not present

## 2019-08-31 DIAGNOSIS — I5032 Chronic diastolic (congestive) heart failure: Secondary | ICD-10-CM | POA: Diagnosis not present

## 2019-08-31 DIAGNOSIS — I50813 Acute on chronic right heart failure: Secondary | ICD-10-CM | POA: Diagnosis not present

## 2019-08-31 DIAGNOSIS — G894 Chronic pain syndrome: Secondary | ICD-10-CM | POA: Diagnosis not present

## 2019-08-31 DIAGNOSIS — I471 Supraventricular tachycardia: Secondary | ICD-10-CM | POA: Diagnosis not present

## 2019-09-01 DIAGNOSIS — I471 Supraventricular tachycardia: Secondary | ICD-10-CM | POA: Diagnosis not present

## 2019-09-01 DIAGNOSIS — I50813 Acute on chronic right heart failure: Secondary | ICD-10-CM | POA: Diagnosis not present

## 2019-09-01 DIAGNOSIS — I5032 Chronic diastolic (congestive) heart failure: Secondary | ICD-10-CM | POA: Diagnosis not present

## 2019-09-01 DIAGNOSIS — I110 Hypertensive heart disease with heart failure: Secondary | ICD-10-CM | POA: Diagnosis not present

## 2019-09-01 DIAGNOSIS — G894 Chronic pain syndrome: Secondary | ICD-10-CM | POA: Diagnosis not present

## 2019-09-01 DIAGNOSIS — M5126 Other intervertebral disc displacement, lumbar region: Secondary | ICD-10-CM | POA: Diagnosis not present

## 2019-09-02 DIAGNOSIS — G894 Chronic pain syndrome: Secondary | ICD-10-CM | POA: Diagnosis not present

## 2019-09-02 DIAGNOSIS — I110 Hypertensive heart disease with heart failure: Secondary | ICD-10-CM | POA: Diagnosis not present

## 2019-09-02 DIAGNOSIS — I471 Supraventricular tachycardia: Secondary | ICD-10-CM | POA: Diagnosis not present

## 2019-09-02 DIAGNOSIS — I5032 Chronic diastolic (congestive) heart failure: Secondary | ICD-10-CM | POA: Diagnosis not present

## 2019-09-02 DIAGNOSIS — I50813 Acute on chronic right heart failure: Secondary | ICD-10-CM | POA: Diagnosis not present

## 2019-09-02 DIAGNOSIS — M5126 Other intervertebral disc displacement, lumbar region: Secondary | ICD-10-CM | POA: Diagnosis not present

## 2019-09-04 DIAGNOSIS — G894 Chronic pain syndrome: Secondary | ICD-10-CM | POA: Diagnosis not present

## 2019-09-04 DIAGNOSIS — I110 Hypertensive heart disease with heart failure: Secondary | ICD-10-CM | POA: Diagnosis not present

## 2019-09-04 DIAGNOSIS — I471 Supraventricular tachycardia: Secondary | ICD-10-CM | POA: Diagnosis not present

## 2019-09-04 DIAGNOSIS — M5126 Other intervertebral disc displacement, lumbar region: Secondary | ICD-10-CM | POA: Diagnosis not present

## 2019-09-04 DIAGNOSIS — I50813 Acute on chronic right heart failure: Secondary | ICD-10-CM | POA: Diagnosis not present

## 2019-09-04 DIAGNOSIS — I5032 Chronic diastolic (congestive) heart failure: Secondary | ICD-10-CM | POA: Diagnosis not present

## 2019-09-05 ENCOUNTER — Encounter (HOSPITAL_COMMUNITY): Payer: Self-pay

## 2019-09-05 ENCOUNTER — Other Ambulatory Visit: Payer: Self-pay

## 2019-09-05 ENCOUNTER — Ambulatory Visit (HOSPITAL_COMMUNITY)
Admission: RE | Admit: 2019-09-05 | Discharge: 2019-09-05 | Disposition: A | Discharge status: Home / Self Care | Payer: Medicare Other | Source: Ambulatory Visit | Attending: Cardiology | Admitting: Cardiology

## 2019-09-05 VITALS — BP 136/82 | HR 90 | Wt 378.6 lb

## 2019-09-05 DIAGNOSIS — I471 Supraventricular tachycardia: Secondary | ICD-10-CM | POA: Diagnosis not present

## 2019-09-05 DIAGNOSIS — I5032 Chronic diastolic (congestive) heart failure: Secondary | ICD-10-CM | POA: Diagnosis not present

## 2019-09-05 DIAGNOSIS — Z86711 Personal history of pulmonary embolism: Secondary | ICD-10-CM | POA: Diagnosis not present

## 2019-09-05 DIAGNOSIS — Z823 Family history of stroke: Secondary | ICD-10-CM | POA: Diagnosis not present

## 2019-09-05 DIAGNOSIS — Z6839 Body mass index (BMI) 39.0-39.9, adult: Secondary | ICD-10-CM | POA: Diagnosis not present

## 2019-09-05 DIAGNOSIS — I110 Hypertensive heart disease with heart failure: Secondary | ICD-10-CM | POA: Diagnosis not present

## 2019-09-05 DIAGNOSIS — Z79899 Other long term (current) drug therapy: Secondary | ICD-10-CM | POA: Insufficient documentation

## 2019-09-05 DIAGNOSIS — M5126 Other intervertebral disc displacement, lumbar region: Secondary | ICD-10-CM | POA: Diagnosis not present

## 2019-09-05 DIAGNOSIS — I50810 Right heart failure, unspecified: Secondary | ICD-10-CM | POA: Diagnosis not present

## 2019-09-05 DIAGNOSIS — Z8249 Family history of ischemic heart disease and other diseases of the circulatory system: Secondary | ICD-10-CM | POA: Insufficient documentation

## 2019-09-05 DIAGNOSIS — Z7901 Long term (current) use of anticoagulants: Secondary | ICD-10-CM | POA: Insufficient documentation

## 2019-09-05 DIAGNOSIS — Z86718 Personal history of other venous thrombosis and embolism: Secondary | ICD-10-CM | POA: Diagnosis not present

## 2019-09-05 DIAGNOSIS — Z8349 Family history of other endocrine, nutritional and metabolic diseases: Secondary | ICD-10-CM | POA: Insufficient documentation

## 2019-09-05 DIAGNOSIS — K7460 Unspecified cirrhosis of liver: Secondary | ICD-10-CM | POA: Diagnosis not present

## 2019-09-05 DIAGNOSIS — J449 Chronic obstructive pulmonary disease, unspecified: Secondary | ICD-10-CM | POA: Diagnosis not present

## 2019-09-05 DIAGNOSIS — E785 Hyperlipidemia, unspecified: Secondary | ICD-10-CM | POA: Diagnosis not present

## 2019-09-05 DIAGNOSIS — G4700 Insomnia, unspecified: Secondary | ICD-10-CM | POA: Insufficient documentation

## 2019-09-05 DIAGNOSIS — Z880 Allergy status to penicillin: Secondary | ICD-10-CM | POA: Diagnosis not present

## 2019-09-05 DIAGNOSIS — I50813 Acute on chronic right heart failure: Secondary | ICD-10-CM | POA: Diagnosis not present

## 2019-09-05 DIAGNOSIS — G894 Chronic pain syndrome: Secondary | ICD-10-CM | POA: Insufficient documentation

## 2019-09-05 LAB — COMPREHENSIVE METABOLIC PANEL
ALT: 22 U/L (ref 0–44)
AST: 32 U/L (ref 15–41)
Albumin: 3.6 g/dL (ref 3.5–5.0)
Alkaline Phosphatase: 53 U/L (ref 38–126)
Anion gap: 12 (ref 5–15)
BUN: 16 mg/dL (ref 8–23)
CO2: 27 mmol/L (ref 22–32)
Calcium: 9.6 mg/dL (ref 8.9–10.3)
Chloride: 99 mmol/L (ref 98–111)
Creatinine, Ser: 1.05 mg/dL (ref 0.61–1.24)
GFR calc Af Amer: >60 mL/min (ref >60)
GFR calc non Af Amer: >60 mL/min (ref >60)
Glucose, Bld: 107 mg/dL — ABNORMAL HIGH (ref 70–99)
Potassium: 4.4 mmol/L (ref 3.5–5.1)
Sodium: 138 mmol/L (ref 135–145)
Total Bilirubin: 0.7 mg/dL (ref 0.3–1.2)
Total Protein: 7.4 g/dL (ref 6.5–8.1)

## 2019-09-05 LAB — BRAIN NATRIURETIC PEPTIDE: B Natriuretic Peptide: 50.6 pg/mL (ref 0.0–100.0)

## 2019-09-05 MED ORDER — TORSEMIDE 20 MG PO TABS: ORAL_TABLET | ORAL | 5 refills | Status: DC

## 2019-09-05 NOTE — Progress Notes (Addendum)
Patient Name: Charles Gray        DOB: [birth year]      Height: 6'10    Weight: 378  Office Name: [facility]         Referring Provider: Lyda Jester  Today's Date: 09/05/2019  Date:   STOP BANG RISK ASSESSMENT S (snore) Have you been told that you snore?     YES   T (tired) Are you often tired, fatigued, or sleepy during the day?   YES  O (obstruction) Do you stop breathing, choke, or gasp during sleep? NO   P (pressure) Do you have or are you being treated for high blood pressure? YES   B (BMI) Is your body index greater than 35 kg/m? YES   A (age) Are you 72 years old or older? YES   N (neck) Do you have a neck circumference greater than 16 inches?      G (gender) Are you a male? YES   TOTAL STOP/BANG "YES" ANSWERS 6                                                                       For Office Use Only              Procedure Order Form    YES to 3+ Stop Bang questions OR two clinical symptoms - patient qualifies for WatchPAT (CPT 95800)     Submit: This Form + Patient Face Sheet + Clinical Note via CloudPAT or Fax: [phone]

## 2019-09-05 NOTE — Progress Notes (Signed)
ADVANCED HF CLINIC CONSULT NOTE  Referring Physician: Dr. Margaretann Loveless  Primary Care: Enid Skeens., MD Primary Cardiologist: Dr. Berniece Salines, DO   Reason for Visit: New Pt Evaluation for Chronic Diastolic HF, diuretic management and consultation for Cardiomems implantation  HPI: 64 y/o obese male, nonsmoker w/ mild COPD, cirrhosis, recent DVT/PE 05/2019 treated w/ Eliquis, chronic diastolic HF w/ suspected prominent RV dysfunction, presenting to clinic as a new pt for further management of his HF and for consideration for possible cardiomems implantation.   He has had multiple hospitalizations in the last 6 months for a/c CHF, requiring IV Lasix and change in PO regimen from lasix to torsemide. Echo 4/21 showed normal LVEF 55-60% w/ G1DD. RV not well visualized, but felt to have component of RV failure. [place] 4/21 showed normal PA pressure and normal cardiac output. PFTs also completed and c/w mild obstructive airway disease.   Today in f/u, he continues w/ fluid overload, despite med compliance. Suboptimal response to torsemide.  Wt is up 8 lb from recent discharge wt. Has 1+ bilateral LEE w/ chronic venous stasis dermatitis. He is NYHA Class III but also limited by chronic arthritic pain, morbid obesity and deconditioning. BP stable/ well controlled. O2 sats stable on RA. Remains fully compliant w/ Eliquis.   Studies   Right Heart Cath 07/2019   Right Heart Pressures RHC Procedural Findings: Hemodynamics (mmHg) RA mean 8 RV 21/8 PA 20/8, mean 14 PCWP mean 11  Oxygen saturations: PA 71% AO 100%  Cardiac Output (Fick) 8.5  Cardiac Index (Fick) 2.8    2D Echo 4/21 IMPRESSIONS    1. Suboptimal image quality for diagnostic purposes.  2. Left ventricular ejection fraction, by estimation, is 55 to 60%. The  left ventricle has normal function. Left ventricular endocardial border  not optimally defined to evaluate regional wall motion. Left ventricular  diastolic parameters are  consistent  with Grade I diastolic dysfunction (impaired relaxation).  3. Right ventricular systolic function was not well visualized. The right  ventricular size is not well visualized.  4. The mitral valve is grossly normal. No evidence of mitral valve  regurgitation. No evidence of mitral stenosis.  5. The aortic valve was not well visualized. Aortic valve regurgitation  is not visualized. No aortic stenosis is present.   PFTs 4/21 Mild Obstructive Airways Disease  Review of Systems: [y] = yes, [ ]  = no   General: Weight gain [ ] ; Weight loss [ ] ; Anorexia [ ] ; Fatigue [ ] ; Fever [ ] ; Chills [ ] ; Weakness [ ]   Cardiac: Chest pain/pressure [ ] ; Resting SOB [ ] ; Exertional SOB [ ] ; Orthopnea [ ] ; Pedal Edema [ ] ; Palpitations [ ] ; Syncope [ ] ; Presyncope [ ] ; Paroxysmal nocturnal dyspnea[ ]   Pulmonary: Cough [ ] ; Wheezing[ ] ; Hemoptysis[ ] ; Sputum [ ] ; Snoring [ ]   GI: Vomiting[ ] ; Dysphagia[ ] ; Melena[ ] ; Hematochezia [ ] ; Heartburn[ ] ; Abdominal pain [ ] ; Constipation [ ] ; Diarrhea [ ] ; BRBPR [ ]   GU: Hematuria[ ] ; Dysuria [ ] ; Nocturia[ ]   Vascular: Pain in legs with walking [ ] ; Pain in feet with lying flat [ ] ; Non-healing sores [ ] ; Stroke [ ] ; TIA [ ] ; Slurred speech [ ] ;  Neuro: Headaches[ ] ; Vertigo[ ] ; Seizures[ ] ; Paresthesias[ ] ;Blurred vision [ ] ; Diplopia [ ] ; Vision changes [ ]   Ortho/Skin: Arthritis [ ] ; Joint pain [ ] ; Muscle pain [ ] ; Joint swelling [ ] ; Back Pain [ ] ; Rash [ ]   Psych: Depression[ ] ;  Anxiety[ ]   Heme: Bleeding problems [ ] ; Clotting disorders [ ] ; Anemia [ ]   Endocrine: Diabetes [ ] ; Thyroid dysfunction[ ]    Past Medical History:  Diagnosis Date  . Acute respiratory failure with hypoxia ([place])   . Anemia of chronic disease   . Benign essential HTN   . Chronic heart failure with preserved EF 05/30/2019   Echo 07/2019: EF 55-60, GR 1 DD  . Chronic pain syndrome   . Cirrhosis of liver without ascites ([place])   . Debility 03/13/2019  . Drug induced  constipation   . Dyspnea   . Fatty liver disease, nonalcoholic   . GIB (gastrointestinal bleeding) 03/04/2019  . Hyperlipidemia 01/24/2019  . Hypertension 01/24/2019  . Hypoalbuminemia due to protein-calorie malnutrition ([place])   . Hypokalemia   . Hyponatremia   . Hypotension due to drugs   . Insomnia 01/24/2019  . Lumbar disc herniation 01/24/2019  . Morbid obesity with BMI of 40.0-44.9, adult ([place]) 05/30/2019  . Normocytic anemia 06/08/2019  . Peripheral edema   . Portal hypertensive gastropathy ([place]) 03/06/2019   Seen on EGD 03/06/2019  . Pulmonary emboli ([place]) 06/08/2019  . Pulmonary embolism ([place]) 06/08/2019  . Shock ([place] [facility])   . Thrombocytopenia ([place])     Current Outpatient Medications  Medication Sig Dispense Refill  . acetaminophen (TYLENOL) 325 MG tablet Take 2 tablets (650 mg total) by mouth every 4 (four) hours as needed for fever or mild pain.    Marland Kitchen apixaban (ELIQUIS) 5 MG TABS tablet Take 1 tablet (5 mg total) by mouth 2 (two) times daily. 60 tablet 0  . Buprenorphine HCl-Naloxone HCl 8-2 MG FILM Place 1 tablet under the tongue in the morning and at bedtime.     . gabapentin (NEURONTIN) 800 MG tablet Take 1 tablet (800 mg total) by mouth 3 (three) times daily. 90 tablet 1  . lisinopril (ZESTRIL) 5 MG tablet Take 1 tablet (5 mg total) by mouth daily. 90 tablet 3  . pantoprazole (PROTONIX) 40 MG tablet Take 40 mg by mouth 2 (two) times daily.    . polyethylene glycol (MIRALAX / GLYCOLAX) 17 g packet Take 17 g by mouth daily. (Patient taking differently: Take 17 g by mouth daily as needed for mild constipation. ) 14 each 0  . pravastatin (PRAVACHOL) 20 MG tablet Take 1 tablet (20 mg total) by mouth daily. 30 tablet 1  . spironolactone (ALDACTONE) 25 MG tablet Take 1 tablet (25 mg total) by mouth daily. 30 tablet 0  . torsemide (DEMADEX) 20 MG tablet Take 1 tablet (20 mg total) by mouth 2 (two) times daily. 60 tablet 0  . VENTOLIN HFA 108 (90 Base) MCG/ACT inhaler Inhale 1 puff  into the lungs every 4 (four) hours as needed for shortness of breath. 6.7 g 1  . zolpidem (AMBIEN) 10 MG tablet Take 10 mg by mouth at bedtime as needed for sleep.      No current facility-administered medications for this encounter.    Allergies  Allergen Reactions  . Penicillins     Did it involve swelling of the face/tongue/throat, SOB, or low BP? N/A Did it involve sudden or severe rash/hives, skin peeling, or any reaction on the inside of your mouth or nose? N/A Did you need to seek medical attention at a hospital or doctor's office?N/A When did it last happen?N/A If all above answers are "NO", may proceed with cephalosporin use.      Social History   Socioeconomic History  . Marital status:  Unknown    Spouse name: Not on file  . Number of children: Not on file  . Years of education: Not on file  . Highest education level: Not on file  Occupational History  . Not on file  Tobacco Use  . Smoking status: Never Smoker  . Smokeless tobacco: Never Used  Substance and Sexual Activity  . Alcohol use: Never  . Drug use: Not Currently    Types: Marijuana    Comment: Smoked for 30 years  . Sexual activity: Not on file  Other Topics Concern  . Not on file  Social History Narrative  . Not on file   Social Determinants of Health   Financial Resource Strain:   . Difficulty of Paying Living Expenses:   Food Insecurity:   . Worried About Charity fundraiser in the Last Year:   . Arboriculturist in the Last Year:   Transportation Needs:   . Film/video editor (Medical):   Marland Kitchen Lack of Transportation (Non-Medical):   Physical Activity:   . Days of Exercise per Week:   . Minutes of Exercise per Session:   Stress:   . Feeling of Stress :   Social Connections:   . Frequency of Communication with Friends and Family:   . Frequency of Social Gatherings with Friends and Family:   . Attends Religious Services:   . Active Member of Clubs or Organizations:   . Attends Theatre manager Meetings:   Marland Kitchen Marital Status:   Intimate Partner Violence:   . Fear of Current or Ex-Partner:   . Emotionally Abused:   Marland Kitchen Physically Abused:   . Sexually Abused:       Family History  Problem Relation Age of Onset  . Hyperlipidemia Mother   . Hypertension Mother   . Stroke Mother   . CAD Maternal Grandmother   . CAD Maternal Grandfather     Vitals:   09/05/19 1531  BP: 136/82  Pulse: 90  SpO2: 96%  Weight: (!) 171.7 kg    PHYSICAL EXAM: General:  Morbidly obese AM. No respiratory difficulty HEENT: normal Neck: supple. Thick neck, JVD assessment difficulty. Carotids 2+ bilat; no bruits. No lymphadenopathy or thryomegaly appreciated. Cor: PMI nondisplaced. Regular rate & rhythm. No rubs, gallops or murmurs. Lungs: clear Abdomen: obese, soft, nontender, nondistended. No hepatosplenomegaly. No bruits or masses. Good bowel sounds. Extremities: no cyanosis, clubbing, rash, 1-2+ bilateral LEE w/ chronic venous stasis dermatitis Neuro: alert & oriented x 3, cranial nerves grossly intact. moves all 4 extremities w/o difficulty. Affect pleasant.    ASSESSMENT & PLAN:  1. Chronic Diastolic Heart Failure w/ Suspected Prominent RV Dysfunction  - Echo 4/21 EF 55-60%. RV not well visualized but more features of R>L sided Heart failure  - RHC 4/21 did show normal filling pressures and normal PA pressure but does have known pulmonary disease w/ recent diagnosis of PE 2/21 and mild obstructive pulmonary disease by PFTs. Also suspect likely component of OSA based on body habitus.  - Will arrange sleep study to r/o OSA - w/ h/o PE, will Check V/Q scan  - Increase torsemide to 40 mg qam/ 20 mg qpm - Continue spironolactone 25 mg daily  - Check BMP today and again in 7 days   - he was given Rx for medical grade compression stockings  - given multiple hospitalizations for a/c CHF in the last 6 months + NYHA Class III symptoms, we discussed cardiomems implant and he is  interested. Will start approval process  - we discussed low sodium diet and fluid restriction    F/u in 4 weeks to reassess volume status

## 2019-09-05 NOTE — Patient Instructions (Addendum)
INCREASE Torsemide to 61m (2 tabs) in the morning and 232m(1 tab) in the evening   Labs today and repeat in 1 week We will only contact you if something comes back abnormal or we need to make some changes. Otherwise no news is good news!  You have been given a script for compression stockings. [facility] that you have a home sleep study.  [employer] is the company that does these test.  They will contact you by phone and must speak with you before they can ship the equipment.  Once they have spoken with you they will send the equipment right to your home with instructions on how to set it up.  Once you have completed the test simply box all the equipment back up and mail back to the company.  IF you have any questions or issues with the equipment please call the company directly at [phone] If your test is positive for sleep apnea and you need a home CPAP machine you will be contacted by Dr [facility]odosia Blenderffice [facility].    You have been ordered a scan of your heart/chest called a VQ scan.  You will get a call to schedule this appointment  Your physician recommends that you schedule a follow-up appointment in: 1 month with the Nurse Practitioner/Physician Assistant and 2 months with Dr McAundra Dubin Please call office at [phone] 2 if you have any questions or concerns.    At the [facility] and your health needs are our priority. As part of our continuing mission to provide you with exceptional heart care, we have created designated Provider Care Teams. These Care Teams include your primary Cardiologist (physician) and Advanced Practice Providers (APPs- Physician Assistants and Nurse Practitioners) who all work together to provide you with the care you need, when you need it.   You may see any of the following providers on your designated Care Team at your next follow  up: . Marland Kitchenr DaGlori Bickers Dr DaLoralie Champagne AmDarrick GrinderNP . BrLyda JesterPA . LaAudry RilesPharmD   Please be sure to bring in all your medications bottles to every appointment.

## 2019-09-06 NOTE — Addendum Note (Signed)
Encounter addended by: Valeda Malm, RN on: 09/06/2019 12:32 PM  Actions taken: Clinical Note Signed

## 2019-09-07 ENCOUNTER — Telehealth (HOSPITAL_COMMUNITY): Payer: Self-pay

## 2019-09-07 DIAGNOSIS — I471 Supraventricular tachycardia: Secondary | ICD-10-CM | POA: Diagnosis not present

## 2019-09-07 DIAGNOSIS — M5126 Other intervertebral disc displacement, lumbar region: Secondary | ICD-10-CM | POA: Diagnosis not present

## 2019-09-07 DIAGNOSIS — I5032 Chronic diastolic (congestive) heart failure: Secondary | ICD-10-CM | POA: Diagnosis not present

## 2019-09-07 DIAGNOSIS — G894 Chronic pain syndrome: Secondary | ICD-10-CM | POA: Diagnosis not present

## 2019-09-07 DIAGNOSIS — I50813 Acute on chronic right heart failure: Secondary | ICD-10-CM | POA: Diagnosis not present

## 2019-09-07 DIAGNOSIS — I110 Hypertensive heart disease with heart failure: Secondary | ICD-10-CM | POA: Diagnosis not present

## 2019-09-07 NOTE — Telephone Encounter (Signed)
Order, OV note, stop bang and demographics all faxed to [employer] at [phone]  

## 2019-09-12 ENCOUNTER — Telehealth (HOSPITAL_COMMUNITY): Payer: Self-pay | Admitting: *Deleted

## 2019-09-12 DIAGNOSIS — I5032 Chronic diastolic (congestive) heart failure: Secondary | ICD-10-CM | POA: Diagnosis not present

## 2019-09-12 DIAGNOSIS — I471 Supraventricular tachycardia: Secondary | ICD-10-CM | POA: Diagnosis not present

## 2019-09-12 DIAGNOSIS — I110 Hypertensive heart disease with heart failure: Secondary | ICD-10-CM | POA: Diagnosis not present

## 2019-09-12 DIAGNOSIS — G894 Chronic pain syndrome: Secondary | ICD-10-CM | POA: Diagnosis not present

## 2019-09-12 DIAGNOSIS — I50813 Acute on chronic right heart failure: Secondary | ICD-10-CM | POA: Diagnosis not present

## 2019-09-12 DIAGNOSIS — M5126 Other intervertebral disc displacement, lumbar region: Secondary | ICD-10-CM | POA: Diagnosis not present

## 2019-09-12 NOTE — Telephone Encounter (Signed)
Pt left VM stating he had blisters on his legs and he wanted to know if he could cauterize them at home. I left detailed message telling patient not to cauterize his blisters and to let our office take a look at them when he comes in the clinic tomorrow.

## 2019-09-13 ENCOUNTER — Telehealth (HOSPITAL_COMMUNITY): Payer: Self-pay | Admitting: *Deleted

## 2019-09-13 ENCOUNTER — Ambulatory Visit (HOSPITAL_COMMUNITY)
Admission: RE | Admit: 2019-09-13 | Discharge: 2019-09-13 | Disposition: A | Discharge status: Home / Self Care | Payer: Medicare Other | Source: Ambulatory Visit | Attending: Internal Medicine | Admitting: Internal Medicine

## 2019-09-13 ENCOUNTER — Other Ambulatory Visit: Payer: Self-pay

## 2019-09-13 DIAGNOSIS — I50810 Right heart failure, unspecified: Secondary | ICD-10-CM

## 2019-09-13 LAB — BASIC METABOLIC PANEL
Anion gap: 11 (ref 5–15)
BUN: 15 mg/dL (ref 8–23)
CO2: 28 mmol/L (ref 22–32)
Calcium: 9.4 mg/dL (ref 8.9–10.3)
Chloride: 99 mmol/L (ref 98–111)
Creatinine, Ser: 1.13 mg/dL (ref 0.61–1.24)
GFR calc Af Amer: >60 mL/min (ref >60)
GFR calc non Af Amer: >60 mL/min (ref >60)
Glucose, Bld: 108 mg/dL — ABNORMAL HIGH (ref 70–99)
Potassium: 4 mmol/L (ref 3.5–5.1)
Sodium: 138 mmol/L (ref 135–145)

## 2019-09-13 NOTE — Telephone Encounter (Signed)
Pt seen in office today for lab visit and c/o blisters on the back of his legs. Blisters examined by Kevan Rosebush, RN. Pt has about 3-4 small blisters on lower part of legs.  Pts legs and feet are still swollen pt has been wearing compression hose from [facility] but will go to medical supply store and get correct compression hose. Pt advised not to pop blisters (pt asked if he could pop blisters). Pt thought this was linked to him taking spironolactone. Pt had a lot of concerns involving spironolactone since he read side effects/adverse reactions to medication. Per Nira Conn wait until lab results come before making any changes and route message to provider regarding blisters.   Routed to [employer]

## 2019-09-14 DIAGNOSIS — I110 Hypertensive heart disease with heart failure: Secondary | ICD-10-CM | POA: Diagnosis not present

## 2019-09-14 DIAGNOSIS — M5126 Other intervertebral disc displacement, lumbar region: Secondary | ICD-10-CM | POA: Diagnosis not present

## 2019-09-14 DIAGNOSIS — I471 Supraventricular tachycardia: Secondary | ICD-10-CM | POA: Diagnosis not present

## 2019-09-14 DIAGNOSIS — I5032 Chronic diastolic (congestive) heart failure: Secondary | ICD-10-CM | POA: Diagnosis not present

## 2019-09-14 DIAGNOSIS — G894 Chronic pain syndrome: Secondary | ICD-10-CM | POA: Diagnosis not present

## 2019-09-14 DIAGNOSIS — I50813 Acute on chronic right heart failure: Secondary | ICD-10-CM | POA: Diagnosis not present

## 2019-09-18 ENCOUNTER — Telehealth (HOSPITAL_COMMUNITY): Payer: Self-pay | Admitting: Vascular Surgery

## 2019-09-18 ENCOUNTER — Ambulatory Visit (INDEPENDENT_AMBULATORY_CARE_PROVIDER_SITE_OTHER): Payer: Medicare Other | Admitting: Cardiology

## 2019-09-18 ENCOUNTER — Other Ambulatory Visit: Payer: Self-pay

## 2019-09-18 ENCOUNTER — Encounter: Payer: Self-pay | Admitting: Cardiology

## 2019-09-18 VITALS — BP 120/70 | HR 92 | Ht >= 80 in | Wt 371.8 lb

## 2019-09-18 DIAGNOSIS — Z86711 Personal history of pulmonary embolism: Secondary | ICD-10-CM | POA: Diagnosis not present

## 2019-09-18 DIAGNOSIS — I110 Hypertensive heart disease with heart failure: Secondary | ICD-10-CM | POA: Diagnosis not present

## 2019-09-18 DIAGNOSIS — I471 Supraventricular tachycardia: Secondary | ICD-10-CM | POA: Diagnosis not present

## 2019-09-18 DIAGNOSIS — R0602 Shortness of breath: Secondary | ICD-10-CM | POA: Diagnosis not present

## 2019-09-18 DIAGNOSIS — I5032 Chronic diastolic (congestive) heart failure: Secondary | ICD-10-CM | POA: Diagnosis not present

## 2019-09-18 DIAGNOSIS — R079 Chest pain, unspecified: Secondary | ICD-10-CM | POA: Diagnosis not present

## 2019-09-18 DIAGNOSIS — G4733 Obstructive sleep apnea (adult) (pediatric): Secondary | ICD-10-CM | POA: Diagnosis not present

## 2019-09-18 DIAGNOSIS — I10 Essential (primary) hypertension: Secondary | ICD-10-CM | POA: Diagnosis not present

## 2019-09-18 DIAGNOSIS — M5126 Other intervertebral disc displacement, lumbar region: Secondary | ICD-10-CM | POA: Diagnosis not present

## 2019-09-18 DIAGNOSIS — I50813 Acute on chronic right heart failure: Secondary | ICD-10-CM | POA: Diagnosis not present

## 2019-09-18 DIAGNOSIS — E782 Mixed hyperlipidemia: Secondary | ICD-10-CM

## 2019-09-18 DIAGNOSIS — K7460 Unspecified cirrhosis of liver: Secondary | ICD-10-CM

## 2019-09-18 DIAGNOSIS — R072 Precordial pain: Secondary | ICD-10-CM | POA: Diagnosis not present

## 2019-09-18 DIAGNOSIS — G894 Chronic pain syndrome: Secondary | ICD-10-CM | POA: Diagnosis not present

## 2019-09-18 MED ORDER — METOPROLOL TARTRATE 100 MG PO TABS
100.0000 mg | ORAL_TABLET | Freq: Once | ORAL | 0 refills | Status: DC
Start: 2019-09-18 — End: 2019-12-23

## 2019-09-18 NOTE — Telephone Encounter (Signed)
Left pt detailed message giving VQ scan appt 6/14 @ 12:30, Asked pt to call back to confirm appt date and time

## 2019-09-18 NOTE — Patient Instructions (Addendum)
Medication Instructions:  Your physician recommends that you continue on your current medications as directed. Please refer to the Current Medication list given to you today.  *If you need a refill on your cardiac medications before your next appointment, please call your pharmacy*   Lab Work: Your physician recommends that you return for lab work today: bmp, mg  And labs 3-7 day before ct : BMP  If you have labs (blood work) drawn today and your tests are completely normal, you will receive your results only by: Marland Kitchen MyChart Message (if you have MyChart) OR . A paper copy in the mail If you have any lab test that is abnormal or we need to change your treatment, we will call you to review the results.   Testing/Procedures: Your cardiac CT will be scheduled at one of the below locations:   [facility] [address] [place], [place] [address] [phone]  [place] [address] [place], [place] [address] [phone]  If scheduled at [facility], please arrive at the [facility] main entrance of [facility] 30 minutes prior to test start time. Proceed to the [facility] Radiology Department (first floor) to check-in and test prep.  If scheduled at [facility], please arrive 15 mins early for check-in and test prep.  Please follow these instructions carefully (unless otherwise directed):  Hold all erectile dysfunction medications at least 3 days (72 hrs) prior to test.  On the Night Before the Test: . Be sure to Drink plenty of water. . Do not consume any caffeinated/decaffeinated beverages or chocolate 12 hours prior to your test. . Do not take any antihistamines 12 hours prior to your test. .   On the Day of the Test: . Drink plenty of water. Do not drink any water within one hour of the test. . Do not eat any food 4 hours prior to the test. . You may take your  regular medications prior to the test.  . Take metoprolol (Lopressor) two hours prior to test. . HOLD torsemide and spironolactone the day of the test          After the Test: . Drink plenty of water. . After receiving IV contrast, you may experience a mild flushed feeling. This is normal. . On occasion, you may experience a mild rash up to 24 hours after the test. This is not dangerous. If this occurs, you can take Benadryl 25 mg and increase your fluid intake. . If you experience trouble breathing, this can be serious. If it is severe call 911 IMMEDIATELY. If it is mild, please call our office. . If you take any of these medications: Glipizide/Metformin, Avandament, Glucavance, please do not take 48 hours after completing test unless otherwise instructed.   Once we have confirmed authorization from your insurance company, we will call you to set up a date and time for your test.   For non-scheduling related questions, please contact the cardiac imaging nurse navigator should you have any questions/concerns: Marchia Bond, Cardiac Imaging Nurse Navigator Burley Saver, Interim Cardiac Imaging Nurse [facility] Direct Office Dial: [phone]   For scheduling needs, including cancellations and rescheduling, please call [phone].      Follow-Up: At [facility], you and your health needs are our priority.  As part of our continuing mission to provide you with exceptional heart care, we have created designated Provider Care Teams.  These Care Teams include your primary Cardiologist (physician) and Advanced Practice Providers (APPs -  Physician Assistants and Nurse Practitioners) who all work together to provide you with the care you need, when you need it.  We recommend signing up for the patient portal called "MyChart".  Sign up information is provided on this After Visit Summary.  MyChart is used to connect with patients for Virtual Visits  (Telemedicine).  Patients are able to view lab/test results, encounter notes, upcoming appointments, etc.  Non-urgent messages can be sent to your provider as well.   To learn more about what you can do with MyChart, go to [email or web address].    Your next appointment:   1 month(s)  The format for your next appointment:   In Person  Provider:   Berniece Salines, DO   Other Instructions   Cardiac CT Angiogram A cardiac CT angiogram is a procedure to look at the heart and the area around the heart. It may be done to help find the cause of chest pains or other symptoms of heart disease. During this procedure, a substance called contrast dye is injected into the blood vessels in the area to be checked. A large X-ray machine, called a CT scanner, then takes detailed pictures of the heart and the surrounding area. The procedure is also sometimes called a coronary CT angiogram, coronary artery scanning, or CTA. A cardiac CT angiogram allows the health care provider to see how well blood is flowing to and from the heart. The health care provider will be able to see if there are any problems, such as:  Blockage or narrowing of the coronary arteries in the heart.  Fluid around the heart.  Signs of weakness or disease in the muscles, valves, and tissues of the heart. Tell a health care provider about:  Any allergies you have. This is especially important if you have had a previous allergic reaction to contrast dye.  All medicines you are taking, including vitamins, herbs, eye drops, creams, and over-the-counter medicines.  Any blood disorders you have.  Any surgeries you have had.  Any medical conditions you have.  Whether you are pregnant or may be pregnant.  Any anxiety disorders, chronic pain, or other conditions you have that may increase your stress or prevent you from lying still. What are the risks? Generally, this is a safe procedure. However, problems may occur,  including:  Bleeding.  Infection.  Allergic reactions to medicines or dyes.  Damage to other structures or organs.  Kidney damage from the contrast dye that is used.  Increased risk of cancer from radiation exposure. This risk is low. Talk with your health care provider about: ? The risks and benefits of testing. ? How you can receive the lowest dose of radiation. What happens before the procedure?  Wear comfortable clothing and remove any jewelry, glasses, dentures, and hearing aids.  Follow instructions from your health care provider about eating and drinking. This may include: ? For 12 hours before the procedure -- avoid caffeine. This includes tea, coffee, soda, energy drinks, and diet pills. Drink plenty of water or other fluids that do not have caffeine in them. Being well hydrated can prevent complications. ? For 4-6 hours before the procedure -- stop eating and drinking. The contrast dye can cause nausea, but this is less likely if your stomach is empty.  Ask your health care provider about changing or stopping your regular medicines. This is especially important if you are taking diabetes medicines, blood  thinners, or medicines to treat problems with erections (erectile dysfunction). What happens during the procedure?   Hair on your chest may need to be removed so that small sticky patches called electrodes can be placed on your chest. These will transmit information that helps to monitor your heart during the procedure.  An IV will be inserted into one of your veins.  You might be given a medicine to control your heart rate during the procedure. This will help to ensure that good images are obtained.  You will be asked to lie on an exam table. This table will slide in and out of the CT machine during the procedure.  Contrast dye will be injected into the IV. You might feel warm, or you may get a metallic taste in your mouth.  You will be given a medicine called  nitroglycerin. This will relax or dilate the arteries in your heart.  The table that you are lying on will move into the CT machine tunnel for the scan.  The person running the machine will give you instructions while the scans are being done. You may be asked to: ? Keep your arms above your head. ? Hold your breath. ? Stay very still, even if the table is moving.  When the scanning is complete, you will be moved out of the machine.  The IV will be removed. The procedure may vary among health care providers and hospitals. What can I expect after the procedure? After your procedure, it is common to have:  A metallic taste in your mouth from the contrast dye.  A feeling of warmth.  A headache from the nitroglycerin. Follow these instructions at home:  Take over-the-counter and prescription medicines only as told by your health care provider.  If you are told, drink enough fluid to keep your urine pale yellow. This will help to flush the contrast dye out of your body.  Most people can return to their normal activities right after the procedure. Ask your health care provider what activities are safe for you.  It is up to you to get the results of your procedure. Ask your health care provider, or the department that is doing the procedure, when your results will be ready.  Keep all follow-up visits as told by your health care provider. This is important. Contact a health care provider if:  You have any symptoms of allergy to the contrast dye. These include: ? Shortness of breath. ? Rash or hives. ? A racing heartbeat. Summary  A cardiac CT angiogram is a procedure to look at the heart and the area around the heart. It may be done to help find the cause of chest pains or other symptoms of heart disease.  During this procedure, a large X-ray machine, called a CT scanner, takes detailed pictures of the heart and the surrounding area after a contrast dye has been injected into blood  vessels in the area.  Ask your health care provider about changing or stopping your regular medicines before the procedure. This is especially important if you are taking diabetes medicines, blood thinners, or medicines to treat erectile dysfunction.  If you are told, drink enough fluid to keep your urine pale yellow. This will help to flush the contrast dye out of your body. This information is not intended to replace advice given to you by your health care provider. Make sure you discuss any questions you have with your health care provider. Document Revised: 11/23/2018 Document Reviewed: 11/23/2018 Elsevier  Patient Education  [employer].

## 2019-09-18 NOTE — Progress Notes (Signed)
Cardiology Office Note:    Date:  09/18/2019   ID:  Charles Gray, DOB 28-May-1955, MRN [id number]  PCP:  Charles Skeens., MD  Cardiologist:  Charles Salines, DO  Electrophysiologist:  None   Referring MD: Charles Skeens., MD   " I am still experiencing shortness of breath, now I do have some chest pain every now and then"  History of Present Illness:    Charles Gray a 64 y.o.malewith a hx of hypertension, hyperlipidemia, morbid obesity, history of nonalcoholic fatty liver disease,herniated disc with chronic pain maintained on Suboxone followed by pain management, morbid obesity BMI 42.60.   Patient was initially seen by me on January 24, 2019 at which time he complained of shortness of breath and fatigue. He did tell me that he was experiencing orthopnea and PND. Therefore at the conclusion of the visit start patient on Lasix 40 mg every 12 hours with potassium 20 mEq daily. An echocardiogram was also ordered and patient was referred to GI for his alcoholic fatty liver disease.  I did see the patient, he appears to be losing weight on the diuretic therapy. At his initial visit he was 410 LBSand his visit onOctober 20, 2020 he was 405 pounds. Although the weight loss the patient still was not euvolemic therefore recommended the patient go to the ED at the [facility] to be given IV diuretics but he declined at that time.  I did see the patient again on February 27, 2019 at that time recommended hospitalization and he declined. I didincrease his lasix doseto 80 mg in the morning and 40 mg at night with metolazone.  He called on 03/02/2019 requestingan increase in his diuretics dose at that time I did tell the patient he needed to go to the ED- Thankfully he did go to the ED.  [facility]and then was [facility]. During his hospital course acute drop in blood pressure as  well as hemoglobin with melanotic stool. He had increasing leukocytosis and lactic acidosis but was afebrile. Placed on broad-spectrum antibiotics. CT of abdomen pelvis abdominal CTA without obvious source of bleeding. Hemoglobin did drop from 14-7.5. He was given 3 units packed red blood cells placed on Protonix and transferred to [facility] 03/04/2019 for further evaluation. Patient did require intubation for airway protection. Upper GI endoscopy 03/05/2019 per gastroenterology Dr. Pura Spice no varices seen. A large amount of fresh and clotted blood was found in the gastric fundus and in the proximal gastric body. Very mild portal hypertensive gastropathy. There was a chain of lesions on a fold in the distal gastric fundus with purple discoloration. Patient self extubated 03/06/2019. Bouts initially of confusion agitation cranial CT scan 03/07/2019 showed no acute findings. Hemoglobin stabilized 8.4.He was notedto havethrombocytopenia improved 111000-136000 felt to be reactive versus cirrhosis. He was discharged torehab. He was able to complete his rehab.  I did see the patient 05/30/2019 post hospitalization, at that visit I restarted his lasix 37m twice a day and stoppedhis metolazone. Blood work was also done. During our visit I encouraged the patient to decrease his salt intake as he was eating Ramen noodles every day.   I last saw that patient on 06/27/2019. Prior to that visit he has been started on Eliquis for pulmonary embolism.   [facility] at that time a  he was treated for heart failure he underwent a right heart catheterization.  He also had a pulmonary function test which showed mild obstruction.  I saw the patient on 08/18/2019 at that time he was euvolemic, but I was concern about left leg swelling - he was started on antibiotic for concern for cellulitis.   He again went to [facility] hospital on 08/23/2019 at that time he  received IV Lasix. In the meantime DVT was also ruled out with venous duplex ultrasound. After his discharge he did she the advanced chf clinic, during this time implantation of the cardiomems device was discussed. His Lasix was changed to torsemide and aldactone was also added. Because of the shortness of breath V/Q scan was also ordered and rule out CTEPH.   Today the patient tells me that he has been experiencing intermittent chest pain. He describes it as intermittent midsternal pain with no radiation. He states that when this occurs his shortness of breath gets significantly worse. He quantifies this about a 5 /10 at it worse.  He had a stress test more than 25 years ago.  Also he thinks that he may be experieincing itchy sensation since he was started on Aldactone. He also showed me some blisters that he recently has had on his leg from leg edema.  Past Medical History:  Diagnosis Date  . Acute on chronic congestive heart failure ([place])   . Acute on chronic diastolic CHF (congestive heart failure) ([place]) 08/23/2019  . Acute on chronic right-sided heart failure ([place]) 08/06/2019  . Acute respiratory failure with hypoxia ([place])   . Anemia of chronic disease   . Atrial tachycardia ([place]) 08/12/2019  . Benign essential HTN   . Chest pain 08/23/2019  . Chronic heart failure with preserved EF 05/30/2019   Echo 07/2019: EF 55-60, GR 1 DD  . Chronic pain syndrome   . Cirrhosis of liver without ascites ([place])   . Class 2 severe obesity due to excess calories with serious comorbidity and body mass index (BMI) of 39.0 to 39.9 in adult ([place]) 05/30/2019  . Debility 03/13/2019  . Drug induced constipation   . Dyspnea   . Fatty liver disease, nonalcoholic   . GIB (gastrointestinal bleeding) 03/04/2019  . Heart failure, systolic, acute ([place]) 1/61/0960  . History of pulmonary embolism 08/12/2019  . Hyperlipidemia 01/24/2019  . Hypertension 01/24/2019  . Hypoalbuminemia due to protein-calorie malnutrition ([place])    . Hypokalemia   . Hyponatremia   . Hypotension due to drugs   . Insomnia 01/24/2019  . Lactic acidosis 08/23/2019  . Lumbar disc herniation 01/24/2019  . Mixed hyperlipidemia 01/24/2019  . Morbid obesity with BMI of 40.0-44.9, adult ([place]) 05/30/2019  . Normocytic anemia 06/08/2019  . Occasional tremors 08/12/2019  . OSA (obstructive sleep apnea) 08/12/2019  . Peripheral edema   . Portal hypertensive gastropathy ([place]) 03/06/2019   Seen on EGD 03/06/2019  . Pulmonary emboli ([place]) 06/08/2019  . Pulmonary embolism ([place]) 06/08/2019  . Redness and swelling of lower leg 08/23/2019  . Shock ([place])   . Thrombocytopenia ([place])     Past Surgical History:  Procedure Laterality Date  . ESOPHAGOGASTRODUODENOSCOPY (EGD) WITH PROPOFOL N/A 03/05/2019   Procedure: ESOPHAGOGASTRODUODENOSCOPY (EGD) WITH PROPOFOL;  Surgeon: Doran Stabler, MD;  Location: [place];  Service: Gastroenterology;  Laterality: N/A;  Large Blood Clot Removed  . ESOPHAGOGASTRODUODENOSCOPY (EGD) WITH PROPOFOL N/A 03/06/2019   Procedure: ESOPHAGOGASTRODUODENOSCOPY (EGD) WITH PROPOFOL;  Surgeon: Thornton Park, MD;  Location: [place];  Service: Gastroenterology;  Laterality: N/A;  . HEMORROIDECTOMY    . RIGHT HEART  CATH N/A 08/09/2019   Procedure: RIGHT HEART CATH;  Surgeon: Larey Dresser, MD;  Location: [place] [facility];  Service: Cardiovascular;  Laterality: N/A;    Current Medications: Current Meds  Medication Sig  . acetaminophen (TYLENOL) 325 MG tablet Take 2 tablets (650 mg total) by mouth every 4 (four) hours as needed for fever or mild pain.  Marland Kitchen apixaban (ELIQUIS) 5 MG TABS tablet Take 1 tablet (5 mg total) by mouth 2 (two) times daily.  . Buprenorphine HCl-Naloxone HCl 8-2 MG FILM Place 1 tablet under the tongue in the morning and at bedtime.   . gabapentin (NEURONTIN) 800 MG tablet Take 1 tablet (800 mg total) by mouth 3 (three) times daily.  Marland Kitchen lisinopril (ZESTRIL) 5 MG tablet Take 1 tablet (5 mg total) by  mouth daily.  . pantoprazole (PROTONIX) 40 MG tablet Take 40 mg by mouth 2 (two) times daily.  . polyethylene glycol (MIRALAX / GLYCOLAX) 17 g packet Take 17 g by mouth daily. (Patient taking differently: Take 17 g by mouth daily as needed for mild constipation. )  . pravastatin (PRAVACHOL) 20 MG tablet Take 1 tablet (20 mg total) by mouth daily.  Marland Kitchen spironolactone (ALDACTONE) 25 MG tablet Take 1 tablet (25 mg total) by mouth daily.  Marland Kitchen torsemide (DEMADEX) 20 MG tablet Take 2 tablets (40 mg total) by mouth every morning AND 1 tablet (20 mg total) every evening.  . VENTOLIN HFA 108 (90 Base) MCG/ACT inhaler Inhale 1 puff into the lungs every 4 (four) hours as needed for shortness of breath.  . zolpidem (AMBIEN) 10 MG tablet Take 10 mg by mouth at bedtime as needed for sleep.      Allergies:   Penicillins   Social History   Socioeconomic History  . Marital status: Unknown    Spouse name: Not on file  . Number of children: Not on file  . Years of education: Not on file  . Highest education level: Not on file  Occupational History  . Not on file  Tobacco Use  . Smoking status: Never Smoker  . Smokeless tobacco: Never Used  Substance and Sexual Activity  . Alcohol use: Never  . Drug use: Not Currently    Types: Marijuana    Comment: Smoked for 30 years  . Sexual activity: Not on file  Other Topics Concern  . Not on file  Social History Narrative  . Not on file   Social Determinants of Health   Financial Resource Strain:   . Difficulty of Paying Living Expenses:   Food Insecurity:   . Worried About Charity fundraiser in the Last Year:   . Arboriculturist in the Last Year:   Transportation Needs:   . Film/video editor (Medical):   Marland Kitchen Lack of Transportation (Non-Medical):   Physical Activity:   . Days of Exercise per Week:   . Minutes of Exercise per Session:   Stress:   . Feeling of Stress :   Social Connections:   . Frequency of Communication with Friends and Family:    . Frequency of Social Gatherings with Friends and Family:   . Attends Religious Services:   . Active Member of Clubs or Organizations:   . Attends Archivist Meetings:   Marland Kitchen Marital Status:      Family History: The patient's family history includes CAD in his maternal grandfather and maternal grandmother; Hyperlipidemia in his mother; Hypertension in his mother; Stroke in his mother.  ROS:   Review of Systems  Constitution: Negative for decreased appetite, fever and weight gain.  HENT: Negative for congestion, ear discharge, hoarse voice and sore throat.   Eyes: Negative for discharge, redness, vision loss in right eye and visual halos.  Cardiovascular: Reports chest pain and  dyspnea on exertion, leg swelling. Negative for orthopnea and palpitations.  Respiratory: Negative for cough, hemoptysis, shortness of breath and snoring.   Endocrine: Negative for heat intolerance and polyphagia.  Hematologic/Lymphatic: Negative for bleeding problem. Does not bruise/bleed easily.  Skin: Negative for flushing, nail changes, rash and suspicious lesions.  Musculoskeletal: Negative for arthritis, joint pain, muscle cramps, myalgias, neck pain and stiffness.  Gastrointestinal: Negative for abdominal pain, bowel incontinence, diarrhea and excessive appetite.  Genitourinary: Negative for decreased libido, genital sores and incomplete emptying.  Neurological: Negative for brief paralysis, focal weakness, headaches and loss of balance.  Psychiatric/Behavioral: Negative for altered mental status, depression and suicidal ideas.  Allergic/Immunologic: Negative for HIV exposure and persistent infections.    EKGs/Labs/Other Studies Reviewed:    The following studies were reviewed today:   EKG:  None today  CTA chest 3/15/21FINDINGS: Cardiovascular: No pulmonary embolism in the pulmonary trunk, right pulmonary artery or left pulmonary artery. Central contrast filling defects in the bilateral  upper lobar pulmonary arteries and in the bibasilar segmental pulmonary arteries. The right ventricle measures 4.4 cm. The left ventricle measures 5.6 cm. Right ventricular to left ventricular ratio is 0.78; no right heart strain. Normal caliber of the thoracic aorta without calcified atherosclerosis.  Mediastinum/Nodes: Nonspecific small lymph nodes measuring up to 9 mm short axis diameter at the gastroesophageal junction. An abnormal number of small mediastinal lymph nodes and a borderline enlarged 12 x 16 mm noncalcified right paratracheal lymph node with normal fatty hilum.  Lungs/Pleura: No pleural fluid. No pneumonia or pulmonary edema. No pneumothorax. A small portion of the bilateral posterior costophrenic angles are incompletely imaged / evaluated. No suspicious pulmonary nodule in the imaged portions of the lungs.  Upper Abdomen: No acute abnormality.  Musculoskeletal: Diffuse bone demineralization and moderate degenerative changes. Healed right rib fracture deformities.  Review of the MIP images confirms the above findings.  I discussed critical results by telephone at the time of interpretation on 06/25/2019 at 18:33 pm with provider Sherwood Gambler , who verbally acknowledged these results.  IMPRESSION: Peripheral bilateral pulmonary emboli without right heart strain.  A nonspecific borderline enlarged right paratracheal noncalcified lymph node. Abnormal number of small mediastinal lymph nodes, possibly reactive.  Incomplete imaging of the bilateral posterior costophrenic angles without obvious pleural effusion or acute pulmonary disease.   TTEIMPRESSIONS4/26/21 1. Suboptimal image quality for diagnostic purposes.  2. Left ventricular ejection fraction, by estimation, is 55 to 60%. The  left ventricle has normal function. Left ventricular endocardial border  not optimally defined to evaluate regional wall motion. Left ventricular  diastolic  parameters are consistent  with Grade I diastolic dysfunction (impaired relaxation).  3. Right ventricular systolic function was not well visualized. The right  ventricular size is not well visualized.  4. The mitral valve is grossly normal. No evidence of mitral valve  regurgitation. No evidence of mitral stenosis.  5. The aortic valve was not well visualized. Aortic valve regurgitation  is not visualized. No aortic stenosis is present.   Right heart Cath:  1. Normal filling pressures.  2. Normal PA pressure.  3. Normal cardiac output.   Recent Labs: 02/27/2019: NT-Pro BNP 37 03/05/2019: TSH 0.809 08/27/2019: Magnesium 2.2 08/30/2019: Hemoglobin  10.7; Platelets 129 09/05/2019: ALT 22; B Natriuretic Peptide 50.6 09/13/2019: BUN 15; Creatinine, Ser 1.13; Potassium 4.0; Sodium 138  Recent Lipid Panel    Component Value Date/Time   CHOL 128 01/24/2019 1121   TRIG 161 (H) 03/07/2019 0721   HDL 42 01/24/2019 1121   CHOLHDL 3.0 01/24/2019 1121   LDLCALC 64 01/24/2019 1121    Physical Exam:    VS:  BP 120/70   Pulse 92   Ht 6' 10" (2.083 m)   Wt (!) 371 lb 12.8 oz (168.6 kg)   SpO2 98%   BMI 38.88 kg/m     Wt Readings from Last 3 Encounters:  09/18/19 (!) 371 lb 12.8 oz (168.6 kg)  09/05/19 (!) 378 lb 9.6 oz (171.7 kg)  08/30/19 (!) 377 lb 6.4 oz (171.2 kg)     GEN: Well nourished, well developed in no acute distress HEENT: Normal NECK: No JVD; No carotid bruits LYMPHATICS: No lymphadenopathy CARDIAC: S1S2 noted,RRR, no murmurs, rubs, gallops RESPIRATORY:  Clear to auscultation without rales, wheezing or rhonchi  ABDOMEN: Soft, non-tender, non-distended, +bowel sounds, no guarding. EXTREMITIES: bilateral +1 leg edema, No cyanosis, no clubbing MUSCULOSKELETAL:  No deformity  SKIN: Warm and dry NEUROLOGIC:  Alert and oriented x 3, non-focal PSYCHIATRIC:  Normal affect, good insight  ASSESSMENT:    1. Shortness of breath   2. Chest pain of uncertain etiology   3.  Precordial chest pain   4. Benign essential HTN   5. Chronic heart failure with preserved ejection fraction (HCC)   6. OSA (obstructive sleep apnea)   7. Cirrhosis of liver without ascites, unspecified hepatic cirrhosis type ([place])   8. Mixed hyperlipidemia   9. History of pulmonary embolism    PLAN:     He reports compliance with medication but I think he his still not fully complaint with his diet.  His physical exam is improved compared to prior. I am hoping the torsemide will continue to give good diuretic effect to keep in out of the hospital. He will also remain on the aldactone for now. HE is at high risk for readmission- I encouraged the patient that he may benefit from the use of the cardiomems device.   He will continue to monitor his weight and symtpoms - he will notify my office if he gains more thea 5 pounds in 1 week.  I also wonder what other processes could me making his shortness of breath worse with increasing edema, I know he has reported liver cirrhosis in his history and he did see GI in the hospital when he was treated for GI bleed. I want the patient revaluated by GI to understand this.  He is schedule for V/Q scan to rule out CTEPh.   His chest pain symptoms are new, and given his risk factor I think it is reasonable to get an ischemic evaluation for completeness. He had a pharmacologic stress test over 25 year ago and prefers not to ge this testing. With this I will have the patient undergo a Cardiac CTa.    The patient is in agreement with the above plan. The patient left the office in stable condition.  The patient will follow up in 1 month   Medication Adjustments/Labs and Tests Ordered: Current medicines are reviewed at length with the patient today.  Concerns regarding medicines are outlined above.  Orders Placed This Encounter  Procedures  . CT CORONARY MORPH W/CTA COR W/SCORE W/CA W/CM &/OR WO/CM  . Basic metabolic panel  .  Magnesium   Meds ordered this  encounter  Medications  . metoprolol tartrate (LOPRESSOR) 100 MG tablet    Sig: Take 1 tablet (100 mg total) by mouth once for 1 dose. Two hours before ct    Dispense:  1 tablet    Refill:  0    Patient Instructions  Medication Instructions:  Your physician recommends that you continue on your current medications as directed. Please refer to the Current Medication list given to you today.  *If you need a refill on your cardiac medications before your next appointment, please call your pharmacy*   Lab Work: Your physician recommends that you return for lab work today: bmp, mg  And labs 3-7 day before ct : BMP  If you have labs (blood work) drawn today and your tests are completely normal, you will receive your results only by: Marland Kitchen MyChart Message (if you have MyChart) OR . A paper copy in the mail If you have any lab test that is abnormal or we need to change your treatment, we will call you to review the results.   Testing/Procedures: Your cardiac CT will be scheduled at one of the below locations:   [facility] [address] [place], [place] [address] [phone]  [place] [address] [place], [place] [address] [phone]  If scheduled at [facility], please arrive at the [facility] main entrance of [facility] 30 minutes prior to test start time. Proceed to the [facility] Radiology Department (first floor) to check-in and test prep.  If scheduled at [facility], please arrive 15 mins early for check-in and test prep.  Please follow these instructions carefully (unless otherwise directed):  Hold all erectile dysfunction medications at least 3 days (72 hrs) prior to test.  On the Night Before the Test: . Be sure to Drink plenty of water. . Do not consume any caffeinated/decaffeinated beverages or chocolate 12 hours prior to your test. . Do not take any  antihistamines 12 hours prior to your test. .   On the Day of the Test: . Drink plenty of water. Do not drink any water within one hour of the test. . Do not eat any food 4 hours prior to the test. . You may take your regular medications prior to the test.  . Take metoprolol (Lopressor) two hours prior to test. . HOLD torsemide and spironolactone the day of the test          After the Test: . Drink plenty of water. . After receiving IV contrast, you may experience a mild flushed feeling. This is normal. . On occasion, you may experience a mild rash up to 24 hours after the test. This is not dangerous. If this occurs, you can take Benadryl 25 mg and increase your fluid intake. . If you experience trouble breathing, this can be serious. If it is severe call 911 IMMEDIATELY. If it is mild, please call our office. . If you take any of these medications: Glipizide/Metformin, Avandament, Glucavance, please do not take 48 hours after completing test unless otherwise instructed.   Once we have confirmed authorization from your insurance company, we will call you to set up a date and time for your test.   For non-scheduling related questions, please contact the cardiac imaging nurse navigator should you have any questions/concerns: Marchia Bond, Cardiac Imaging Nurse Navigator Burley Saver, Interim Cardiac Imaging Nurse Navigator [facility] Direct Office Dial: [phone]   For scheduling needs, including cancellations and rescheduling, please call [phone].      Follow-Up: At [facility], you and your health needs are our priority.  As part of our continuing mission to provide you with exceptional heart care, we have created designated Provider Care Teams.  These Care Teams include your primary Cardiologist (physician) and Advanced Practice Providers (APPs -  Physician Assistants and Nurse Practitioners) who all work together to provide you with the care  you need, when you need it.  We recommend signing up for the patient portal called "MyChart".  Sign up information is provided on this After Visit Summary.  MyChart is used to connect with patients for Virtual Visits (Telemedicine).  Patients are able to view lab/test results, encounter notes, upcoming appointments, etc.  Non-urgent messages can be sent to your provider as well.   To learn more about what you can do with MyChart, go to [email or web address].    Your next appointment:   1 month(s)  The format for your next appointment:   In Person  Provider:   Berniece Salines, DO   Other Instructions   Cardiac CT Angiogram A cardiac CT angiogram is a procedure to look at the heart and the area around the heart. It may be done to help find the cause of chest pains or other symptoms of heart disease. During this procedure, a substance called contrast dye is injected into the blood vessels in the area to be checked. A large X-ray machine, called a CT scanner, then takes detailed pictures of the heart and the surrounding area. The procedure is also sometimes called a coronary CT angiogram, coronary artery scanning, or CTA. A cardiac CT angiogram allows the health care provider to see how well blood is flowing to and from the heart. The health care provider will be able to see if there are any problems, such as:  Blockage or narrowing of the coronary arteries in the heart.  Fluid around the heart.  Signs of weakness or disease in the muscles, valves, and tissues of the heart. Tell a health care provider about:  Any allergies you have. This is especially important if you have had a previous allergic reaction to contrast dye.  All medicines you are taking, including vitamins, herbs, eye drops, creams, and over-the-counter medicines.  Any blood disorders you have.  Any surgeries you have had.  Any medical conditions you have.  Whether you are pregnant or may be pregnant.  Any anxiety  disorders, chronic pain, or other conditions you have that may increase your stress or prevent you from lying still. What are the risks? Generally, this is a safe procedure. However, problems may occur, including:  Bleeding.  Infection.  Allergic reactions to medicines or dyes.  Damage to other structures or organs.  Kidney damage from the contrast dye that is used.  Increased risk of cancer from radiation exposure. This risk is low. Talk with your health care provider about: ? The risks and benefits of testing. ? How you can receive the lowest dose of radiation. What happens before the procedure?  Wear comfortable clothing and remove any jewelry, glasses, dentures, and hearing aids.  Follow instructions from your health care provider about eating and drinking. This may include: ? For 12 hours before the procedure -- avoid caffeine. This includes tea, coffee, soda, energy drinks, and diet pills. Drink plenty of water or other fluids that do not have caffeine in them.  Being well hydrated can prevent complications. ? For 4-6 hours before the procedure -- stop eating and drinking. The contrast dye can cause nausea, but this is less likely if your stomach is empty.  Ask your health care provider about changing or stopping your regular medicines. This is especially important if you are taking diabetes medicines, blood thinners, or medicines to treat problems with erections (erectile dysfunction). What happens during the procedure?   Hair on your chest may need to be removed so that small sticky patches called electrodes can be placed on your chest. These will transmit information that helps to monitor your heart during the procedure.  An IV will be inserted into one of your veins.  You might be given a medicine to control your heart rate during the procedure. This will help to ensure that good images are obtained.  You will be asked to lie on an exam table. This table will slide in and  out of the CT machine during the procedure.  Contrast dye will be injected into the IV. You might feel warm, or you may get a metallic taste in your mouth.  You will be given a medicine called nitroglycerin. This will relax or dilate the arteries in your heart.  The table that you are lying on will move into the CT machine tunnel for the scan.  The person running the machine will give you instructions while the scans are being done. You may be asked to: ? Keep your arms above your head. ? Hold your breath. ? Stay very still, even if the table is moving.  When the scanning is complete, you will be moved out of the machine.  The IV will be removed. The procedure may vary among health care providers and hospitals. What can I expect after the procedure? After your procedure, it is common to have:  A metallic taste in your mouth from the contrast dye.  A feeling of warmth.  A headache from the nitroglycerin. Follow these instructions at home:  Take over-the-counter and prescription medicines only as told by your health care provider.  If you are told, drink enough fluid to keep your urine pale yellow. This will help to flush the contrast dye out of your body.  Most people can return to their normal activities right after the procedure. Ask your health care provider what activities are safe for you.  It is up to you to get the results of your procedure. Ask your health care provider, or the department that is doing the procedure, when your results will be ready.  Keep all follow-up visits as told by your health care provider. This is important. Contact a health care provider if:  You have any symptoms of allergy to the contrast dye. These include: ? Shortness of breath. ? Rash or hives. ? A racing heartbeat. Summary  A cardiac CT angiogram is a procedure to look at the heart and the area around the heart. It may be done to help find the cause of chest pains or other symptoms of  heart disease.  During this procedure, a large X-ray machine, called a CT scanner, takes detailed pictures of the heart and the surrounding area after a contrast dye has been injected into blood vessels in the area.  Ask your health care provider about changing or stopping your regular medicines before the procedure. This is especially important if you are taking diabetes medicines, blood thinners, or medicines to treat erectile dysfunction.  If you are  told, drink enough fluid to keep your urine pale yellow. This will help to flush the contrast dye out of your body. This information is not intended to replace advice given to you by your health care provider. Make sure you discuss any questions you have with your health care provider. Document Revised: 11/23/2018 Document Reviewed: 11/23/2018 Elsevier Patient Education  [place].      Adopting a Healthy Lifestyle.  Know what a healthy weight is for you (roughly BMI <25) and aim to maintain this   Aim for 7+ servings of fruits and vegetables daily   65-80+ fluid ounces of water or unsweet tea for healthy kidneys   Limit to max 1 drink of alcohol per day; avoid smoking/tobacco   Limit animal fats in diet for cholesterol and heart health - choose grass fed whenever available   Avoid highly processed foods, and foods high in saturated/trans fats   Aim for low stress - take time to unwind and care for your mental health   Aim for 150 min of moderate intensity exercise weekly for heart health, and weights twice weekly for bone health   Aim for 7-9 hours of sleep daily   When it comes to diets, agreement about the perfect plan isnt easy to find, even among the experts. Experts at the [employer] developed an idea known as the Healthy Eating Plate. Just imagine a plate divided into logical, healthy portions.   The emphasis is on diet quality:   Load up on vegetables and fruits - one-half of your plate:  Aim for color and variety, and remember that potatoes dont count.   Go for whole grains - one-quarter of your plate: Whole wheat, barley, wheat berries, quinoa, oats, brown rice, and foods made with them. If you want pasta, go with whole wheat pasta.   Protein power - one-quarter of your plate: Fish, chicken, beans, and nuts are all healthy, versatile protein sources. Limit red meat.   The diet, however, does go beyond the plate, offering a few other suggestions.   Use healthy plant oils, such as olive, canola, soy, corn, sunflower and peanut. Check the labels, and avoid partially hydrogenated oil, which have unhealthy trans fats.   If youre thirsty, drink water. Coffee and tea are good in moderation, but skip sugary drinks and limit milk and dairy products to one or two daily servings.   The type of carbohydrate in the diet is more important than the amount. Some sources of carbohydrates, such as vegetables, fruits, whole grains, and beans-are healthier than others.   Finally, stay active  Signed, Charles Salines, DO  09/18/2019 10:39 PM    [facility]

## 2019-09-19 ENCOUNTER — Telehealth: Payer: Self-pay | Admitting: Cardiology

## 2019-09-19 ENCOUNTER — Telehealth: Payer: Self-pay

## 2019-09-19 DIAGNOSIS — I110 Hypertensive heart disease with heart failure: Secondary | ICD-10-CM | POA: Diagnosis not present

## 2019-09-19 DIAGNOSIS — I5032 Chronic diastolic (congestive) heart failure: Secondary | ICD-10-CM | POA: Diagnosis not present

## 2019-09-19 DIAGNOSIS — G894 Chronic pain syndrome: Secondary | ICD-10-CM | POA: Diagnosis not present

## 2019-09-19 DIAGNOSIS — I471 Supraventricular tachycardia: Secondary | ICD-10-CM | POA: Diagnosis not present

## 2019-09-19 DIAGNOSIS — M5126 Other intervertebral disc displacement, lumbar region: Secondary | ICD-10-CM | POA: Diagnosis not present

## 2019-09-19 DIAGNOSIS — I50813 Acute on chronic right heart failure: Secondary | ICD-10-CM | POA: Diagnosis not present

## 2019-09-19 LAB — BASIC METABOLIC PANEL
BUN/Creatinine Ratio: 14 (ref 10–24)
BUN: 15 mg/dL (ref 8–27)
CO2: 23 mmol/L (ref 20–29)
Calcium: 9.5 mg/dL (ref 8.6–10.2)
Chloride: 96 mmol/L (ref 96–106)
Creatinine, Ser: 1.06 mg/dL (ref 0.76–1.27)
GFR calc Af Amer: 86 mL/min/{1.73_m2} (ref >59)
GFR calc non Af Amer: 74 mL/min/{1.73_m2} (ref >59)
Glucose: 99 mg/dL (ref 65–99)
Potassium: 3.9 mmol/L (ref 3.5–5.2)
Sodium: 140 mmol/L (ref 134–144)

## 2019-09-19 LAB — MAGNESIUM: Magnesium: 2.2 mg/dL (ref 1.6–2.3)

## 2019-09-19 NOTE — Telephone Encounter (Signed)
Pt states that he lost 5 lbs this am. Pt states that he has been feeling dizzy. Pt states that he was taking toresemide 40 mg in am and 20 mg in the evening. Pt is also taking spironolactone 25 mg daily which causes him to itch since he started it. Pt states that he is short of breath, dizzy, and diaphoretic. Pt states that he has been having these symptoms since he started on all of these medications. Pt denies chest pain. Pt states that he only took 20 mg of the torsemide this am due to the sx.

## 2019-09-19 NOTE — Telephone Encounter (Signed)
-----   Message from Berniece Salines, DO sent at 09/19/2019 10:17 AM EDT ----- Labs normal.  Please notify patient.

## 2019-09-19 NOTE — Telephone Encounter (Signed)
Charles Gray, please call the patient and get more clinical information. Thank you.

## 2019-09-19 NOTE — Telephone Encounter (Signed)
Left message on patients voicemail to please return our call.   

## 2019-09-19 NOTE — Telephone Encounter (Signed)
Recommendations reviewed with pt as per Dr. Terrial Rhodes note.  Pt verbalized understanding and had no additional questions.

## 2019-09-19 NOTE — Telephone Encounter (Signed)
Patient states he lost 5 lbs over night. He states he has also been feeling dizzy. Please call.   STAT if patient feels like he/she is going to faint   1) Are you dizzy now? No  2) Do you feel faint or have you passed out? No  3) Do you have any other symptoms? Fatigue, sweats  4) Have you checked your HR and BP (record if available)?  BP: 141/83  HR: 88 BP: 134/73

## 2019-09-19 NOTE — Telephone Encounter (Signed)
Please let him know I will keep him taking only 20 mg of torsemide today.  Be okay to take another 20 mg of torsemide tomorrow.  But if he does feel better before he gets back on his regular dosage of torsemide.  Please ask him to keep [place] updated.

## 2019-09-19 NOTE — Telephone Encounter (Signed)
Message left for pt to call back.

## 2019-09-19 NOTE — Telephone Encounter (Signed)
Spoke with patient regarding results and recommendation.  Patient verbalizes understanding and is agreeable to plan of care. Advised patient to call back with any issues or concerns.  

## 2019-09-20 DIAGNOSIS — G4733 Obstructive sleep apnea (adult) (pediatric): Secondary | ICD-10-CM | POA: Diagnosis not present

## 2019-09-20 DIAGNOSIS — D509 Iron deficiency anemia, unspecified: Secondary | ICD-10-CM | POA: Diagnosis not present

## 2019-09-20 DIAGNOSIS — K760 Fatty (change of) liver, not elsewhere classified: Secondary | ICD-10-CM | POA: Diagnosis not present

## 2019-09-20 DIAGNOSIS — M5126 Other intervertebral disc displacement, lumbar region: Secondary | ICD-10-CM | POA: Diagnosis not present

## 2019-09-20 DIAGNOSIS — E871 Hypo-osmolality and hyponatremia: Secondary | ICD-10-CM | POA: Diagnosis not present

## 2019-09-20 DIAGNOSIS — K7581 Nonalcoholic steatohepatitis (NASH): Secondary | ICD-10-CM | POA: Diagnosis not present

## 2019-09-20 DIAGNOSIS — E782 Mixed hyperlipidemia: Secondary | ICD-10-CM | POA: Diagnosis not present

## 2019-09-20 DIAGNOSIS — I110 Hypertensive heart disease with heart failure: Secondary | ICD-10-CM | POA: Diagnosis not present

## 2019-09-20 DIAGNOSIS — Z79891 Long term (current) use of opiate analgesic: Secondary | ICD-10-CM | POA: Diagnosis not present

## 2019-09-20 DIAGNOSIS — Z9861 Coronary angioplasty status: Secondary | ICD-10-CM | POA: Diagnosis not present

## 2019-09-20 DIAGNOSIS — I5032 Chronic diastolic (congestive) heart failure: Secondary | ICD-10-CM | POA: Diagnosis not present

## 2019-09-20 DIAGNOSIS — J45909 Unspecified asthma, uncomplicated: Secondary | ICD-10-CM | POA: Diagnosis not present

## 2019-09-20 DIAGNOSIS — K766 Portal hypertension: Secondary | ICD-10-CM | POA: Diagnosis not present

## 2019-09-20 DIAGNOSIS — I50813 Acute on chronic right heart failure: Secondary | ICD-10-CM | POA: Diagnosis not present

## 2019-09-20 DIAGNOSIS — K219 Gastro-esophageal reflux disease without esophagitis: Secondary | ICD-10-CM | POA: Diagnosis not present

## 2019-09-20 DIAGNOSIS — G4700 Insomnia, unspecified: Secondary | ICD-10-CM | POA: Diagnosis not present

## 2019-09-20 DIAGNOSIS — Z86711 Personal history of pulmonary embolism: Secondary | ICD-10-CM | POA: Diagnosis not present

## 2019-09-20 DIAGNOSIS — Z6841 Body Mass Index (BMI) 40.0 and over, adult: Secondary | ICD-10-CM | POA: Diagnosis not present

## 2019-09-20 DIAGNOSIS — G894 Chronic pain syndrome: Secondary | ICD-10-CM | POA: Diagnosis not present

## 2019-09-20 DIAGNOSIS — D696 Thrombocytopenia, unspecified: Secondary | ICD-10-CM | POA: Diagnosis not present

## 2019-09-20 DIAGNOSIS — E872 Acidosis: Secondary | ICD-10-CM | POA: Diagnosis not present

## 2019-09-20 DIAGNOSIS — I872 Venous insufficiency (chronic) (peripheral): Secondary | ICD-10-CM | POA: Diagnosis not present

## 2019-09-20 DIAGNOSIS — K3189 Other diseases of stomach and duodenum: Secondary | ICD-10-CM | POA: Diagnosis not present

## 2019-09-20 DIAGNOSIS — Z7901 Long term (current) use of anticoagulants: Secondary | ICD-10-CM | POA: Diagnosis not present

## 2019-09-21 ENCOUNTER — Telehealth: Payer: Self-pay | Admitting: Cardiology

## 2019-09-21 MED ORDER — TORSEMIDE 20 MG PO TABS: ORAL_TABLET | ORAL | 5 refills | Status: DC

## 2019-09-21 MED ORDER — SPIRONOLACTONE 25 MG PO TABS: 25.0000 mg | ORAL_TABLET | Freq: Every day | ORAL | 3 refills | Status: DC

## 2019-09-21 NOTE — Telephone Encounter (Signed)
Refill sent in per request.  

## 2019-09-21 NOTE — Telephone Encounter (Signed)
New message    *STAT* If patient is at the pharmacy, call can be transferred to refill team.   1. Which medications need to be refilled? (please list name of each medication and dose if known) torsemide (DEMADEX) 20 MG tablet    spironolactone (ALDACTONE) 25 MG tablet  2. Which pharmacy/location (including street and city if local pharmacy) is medication to be sent to? [place], [place]  3. Do they need a 30 day or 90 day supply? [place]

## 2019-09-25 ENCOUNTER — Ambulatory Visit (HOSPITAL_COMMUNITY)
Admission: RE | Admit: 2019-09-25 | Discharge: 2019-09-25 | Disposition: A | Discharge status: Home / Self Care | Payer: Medicare Other | Source: Ambulatory Visit | Attending: Cardiology | Admitting: Cardiology

## 2019-09-25 ENCOUNTER — Other Ambulatory Visit: Payer: Self-pay

## 2019-09-25 DIAGNOSIS — J984 Other disorders of lung: Secondary | ICD-10-CM | POA: Diagnosis not present

## 2019-09-25 DIAGNOSIS — I50810 Right heart failure, unspecified: Secondary | ICD-10-CM

## 2019-09-28 DIAGNOSIS — E871 Hypo-osmolality and hyponatremia: Secondary | ICD-10-CM | POA: Diagnosis not present

## 2019-09-28 DIAGNOSIS — I50813 Acute on chronic right heart failure: Secondary | ICD-10-CM | POA: Diagnosis not present

## 2019-09-28 DIAGNOSIS — E872 Acidosis: Secondary | ICD-10-CM | POA: Diagnosis not present

## 2019-09-28 DIAGNOSIS — I110 Hypertensive heart disease with heart failure: Secondary | ICD-10-CM | POA: Diagnosis not present

## 2019-09-28 DIAGNOSIS — I5032 Chronic diastolic (congestive) heart failure: Secondary | ICD-10-CM | POA: Diagnosis not present

## 2019-09-29 DIAGNOSIS — I50813 Acute on chronic right heart failure: Secondary | ICD-10-CM | POA: Diagnosis not present

## 2019-09-29 DIAGNOSIS — E872 Acidosis: Secondary | ICD-10-CM | POA: Diagnosis not present

## 2019-09-29 DIAGNOSIS — E871 Hypo-osmolality and hyponatremia: Secondary | ICD-10-CM | POA: Diagnosis not present

## 2019-09-29 DIAGNOSIS — I110 Hypertensive heart disease with heart failure: Secondary | ICD-10-CM | POA: Diagnosis not present

## 2019-09-29 DIAGNOSIS — I5032 Chronic diastolic (congestive) heart failure: Secondary | ICD-10-CM | POA: Diagnosis not present

## 2019-10-03 DIAGNOSIS — I5032 Chronic diastolic (congestive) heart failure: Secondary | ICD-10-CM | POA: Diagnosis not present

## 2019-10-03 DIAGNOSIS — I50813 Acute on chronic right heart failure: Secondary | ICD-10-CM | POA: Diagnosis not present

## 2019-10-03 DIAGNOSIS — I110 Hypertensive heart disease with heart failure: Secondary | ICD-10-CM | POA: Diagnosis not present

## 2019-10-03 DIAGNOSIS — E871 Hypo-osmolality and hyponatremia: Secondary | ICD-10-CM | POA: Diagnosis not present

## 2019-10-03 DIAGNOSIS — E872 Acidosis: Secondary | ICD-10-CM | POA: Diagnosis not present

## 2019-10-05 ENCOUNTER — Other Ambulatory Visit: Payer: Self-pay

## 2019-10-05 ENCOUNTER — Ambulatory Visit (HOSPITAL_COMMUNITY)
Admission: RE | Admit: 2019-10-05 | Discharge: 2019-10-05 | Disposition: A | Discharge status: Home / Self Care | Payer: Medicare Other | Source: Ambulatory Visit | Attending: Internal Medicine | Admitting: Internal Medicine

## 2019-10-05 ENCOUNTER — Encounter (HOSPITAL_COMMUNITY): Payer: Self-pay

## 2019-10-05 VITALS — BP 118/64 | HR 93 | Wt 378.0 lb

## 2019-10-05 DIAGNOSIS — K7460 Unspecified cirrhosis of liver: Secondary | ICD-10-CM | POA: Diagnosis not present

## 2019-10-05 DIAGNOSIS — Z8349 Family history of other endocrine, nutritional and metabolic diseases: Secondary | ICD-10-CM | POA: Insufficient documentation

## 2019-10-05 DIAGNOSIS — Z86718 Personal history of other venous thrombosis and embolism: Secondary | ICD-10-CM | POA: Diagnosis not present

## 2019-10-05 DIAGNOSIS — K760 Fatty (change of) liver, not elsewhere classified: Secondary | ICD-10-CM | POA: Diagnosis not present

## 2019-10-05 DIAGNOSIS — K766 Portal hypertension: Secondary | ICD-10-CM | POA: Insufficient documentation

## 2019-10-05 DIAGNOSIS — G4700 Insomnia, unspecified: Secondary | ICD-10-CM | POA: Diagnosis not present

## 2019-10-05 DIAGNOSIS — J449 Chronic obstructive pulmonary disease, unspecified: Secondary | ICD-10-CM | POA: Insufficient documentation

## 2019-10-05 DIAGNOSIS — Z880 Allergy status to penicillin: Secondary | ICD-10-CM | POA: Diagnosis not present

## 2019-10-05 DIAGNOSIS — Z8249 Family history of ischemic heart disease and other diseases of the circulatory system: Secondary | ICD-10-CM | POA: Insufficient documentation

## 2019-10-05 DIAGNOSIS — Z7901 Long term (current) use of anticoagulants: Secondary | ICD-10-CM | POA: Insufficient documentation

## 2019-10-05 DIAGNOSIS — G4733 Obstructive sleep apnea (adult) (pediatric): Secondary | ICD-10-CM | POA: Insufficient documentation

## 2019-10-05 DIAGNOSIS — E782 Mixed hyperlipidemia: Secondary | ICD-10-CM | POA: Diagnosis not present

## 2019-10-05 DIAGNOSIS — Z86711 Personal history of pulmonary embolism: Secondary | ICD-10-CM | POA: Diagnosis not present

## 2019-10-05 DIAGNOSIS — Z79899 Other long term (current) drug therapy: Secondary | ICD-10-CM | POA: Insufficient documentation

## 2019-10-05 DIAGNOSIS — I110 Hypertensive heart disease with heart failure: Secondary | ICD-10-CM | POA: Diagnosis not present

## 2019-10-05 DIAGNOSIS — I5042 Chronic combined systolic (congestive) and diastolic (congestive) heart failure: Secondary | ICD-10-CM | POA: Diagnosis present

## 2019-10-05 DIAGNOSIS — I5032 Chronic diastolic (congestive) heart failure: Secondary | ICD-10-CM | POA: Diagnosis not present

## 2019-10-05 DIAGNOSIS — G894 Chronic pain syndrome: Secondary | ICD-10-CM | POA: Insufficient documentation

## 2019-10-05 LAB — BASIC METABOLIC PANEL
Anion gap: 12 (ref 5–15)
BUN: 16 mg/dL (ref 8–23)
CO2: 27 mmol/L (ref 22–32)
Calcium: 9.2 mg/dL (ref 8.9–10.3)
Chloride: 98 mmol/L (ref 98–111)
Creatinine, Ser: 1.03 mg/dL (ref 0.61–1.24)
GFR calc Af Amer: >60 mL/min (ref >60)
GFR calc non Af Amer: >60 mL/min (ref >60)
Glucose, Bld: 99 mg/dL (ref 70–99)
Potassium: 4.3 mmol/L (ref 3.5–5.1)
Sodium: 137 mmol/L (ref 135–145)

## 2019-10-05 NOTE — Patient Instructions (Signed)
It was great to see you today! No medication changes are needed at this time.  Labs today We will only contact you if something comes back abnormal or we need to make some changes. Otherwise no news is good news!  Keep follow up as scheduled with Dr [name].  Do the following things EVERYDAY: 1) Weigh yourself in the morning before breakfast. Write it down and keep it in a log. 2) Take your medicines as prescribed 3) Eat low salt foods--Limit salt (sodium) to 2000 mg per day.  4) Stay as active as you can everyday 5) Limit all fluids for the day to less than 2 liters  At the [facility], you and your health needs are our priority. As part of our continuing mission to provide you with exceptional heart care, we have created designated Provider Care Teams. These Care Teams include your primary Cardiologist (physician) and Advanced Practice Providers (APPs- Physician Assistants and Nurse Practitioners) who all work together to provide you with the care you need, when you need it.   You may see any of the following providers on your designated Care Team at your next follow up: Marland Kitchen Dr Glori Bickers . Dr Loralie Champagne . [name], NP . Lyda Jester, PA . Audry Riles, PharmD   Please be sure to bring in all your medications bottles to every appointment.

## 2019-10-05 NOTE — Progress Notes (Signed)
ADVANCED HF CLINIC PROGRESS NOTE  Referring Physician: Dr. Margaretann Loveless  Primary Care: Enid Skeens., MD Primary Cardiologist: Dr. Berniece Salines, DO   Reason for Visit: Chronic Diastolic HF  HPI: 64 y/o obese male, nonsmoker w/ mild COPD, cirrhosis, recent DVT/PE 05/2019 treated w/ Eliquis, chronic diastolic HF w/ suspected prominent RV dysfunction, initially referred by [facility] for consideration for possible cardiomems implantation.   He has had multiple hospitalizations in the last 6 months for a/c CHF, requiring IV Lasix and change in PO regimen from lasix to torsemide. Echo 4/21 showed normal LVEF 55-60% w/ G1DD. RV not well visualized, but felt to have component of RV failure. Welcome 4/21 showed normal PA pressure and normal cardiac output. PFTs also completed and c/w mild obstructive airway disease. Discharge wt in April was 371b.   6/21 had V/Q scan. Negative for chronic thromboembolic disease  Returns to clinic today for f/u. He remains symptomatic w/ exertional symptoms,  NYHA Class III. No resting dyspnea. No CP. His primary cardiologist, Dr. Harriet Masson, has ordered a coronary CTA to r/o CAD. Has chronic bilateral LEE, trace-1+ w/ chronic venous stasis dermatitis. Wt elevated today at 371 lb, ~ 6 lb above dry wt. BP well controlled. Reports full med compliance.   Studies     Right Heart Cath 07/2019   Right Heart Pressures RHC Procedural Findings: Hemodynamics (mmHg) RA mean 8 RV 21/8 PA 20/8, mean 14 PCWP mean 11  Oxygen saturations: PA 71% AO 100%  Cardiac Output (Fick) 8.5  Cardiac Index (Fick) 2.8    2D Echo 4/21 IMPRESSIONS    1. Suboptimal image quality for diagnostic purposes.  2. Left ventricular ejection fraction, by estimation, is 55 to 60%. The  left ventricle has normal function. Left ventricular endocardial border  not optimally defined to evaluate regional wall motion. Left ventricular  diastolic parameters are consistent  with Grade I diastolic  dysfunction (impaired relaxation).  3. Right ventricular systolic function was not well visualized. The right  ventricular size is not well visualized.  4. The mitral valve is grossly normal. No evidence of mitral valve  regurgitation. No evidence of mitral stenosis.  5. The aortic valve was not well visualized. Aortic valve regurgitation  is not visualized. No aortic stenosis is present.   PFTs 4/21 Mild Obstructive Airways Disease  V/Q Scan 6/21 IMPRESSION: 1. No segmental perfusion defects identified bilaterally to suggest acute pulmonary embolus or chronic thromboembolic disease.  Review of Systems: [y] = yes, [ ]  = no   General: Weight gain [ ] ; Weight loss [ ] ; Anorexia [ ] ; Fatigue [ ] ; Fever [ ] ; Chills [ ] ; Weakness [ ]   Cardiac: Chest pain/pressure [ ] ; Resting SOB [ ] ; Exertional SOB [ ] ; Orthopnea [ ] ; Pedal Edema [ ] ; Palpitations [ ] ; Syncope [ ] ; Presyncope [ ] ; Paroxysmal nocturnal dyspnea[ ]   Pulmonary: Cough [ ] ; Wheezing[ ] ; Hemoptysis[ ] ; Sputum [ ] ; Snoring [ ]   GI: Vomiting[ ] ; Dysphagia[ ] ; Melena[ ] ; Hematochezia [ ] ; Heartburn[ ] ; Abdominal pain [ ] ; Constipation [ ] ; Diarrhea [ ] ; BRBPR [ ]   GU: Hematuria[ ] ; Dysuria [ ] ; Nocturia[ ]   Vascular: Pain in legs with walking [ ] ; Pain in feet with lying flat [ ] ; Non-healing sores [ ] ; Stroke [ ] ; TIA [ ] ; Slurred speech [ ] ;  Neuro: Headaches[ ] ; Vertigo[ ] ; Seizures[ ] ; Paresthesias[ ] ;Blurred vision [ ] ; Diplopia [ ] ; Vision changes [ ]   Ortho/Skin: Arthritis [ ] ; Joint pain [ ] ;  Muscle pain [ ] ; Joint swelling [ ] ; Back Pain [ ] ; Rash [ ]   Psych: Depression[ ] ; Anxiety[ ]   Heme: Bleeding problems [ ] ; Clotting disorders [ ] ; Anemia [ ]   Endocrine: Diabetes [ ] ; Thyroid dysfunction[ ]    Past Medical History:  Diagnosis Date  . Acute on chronic congestive heart failure ([place])   . Acute on chronic diastolic CHF (congestive heart failure) ([place]) 08/23/2019  . Acute on chronic right-sided heart failure ([place])  08/06/2019  . Acute respiratory failure with hypoxia ([place])   . Anemia of chronic disease   . Atrial tachycardia ([place]) 08/12/2019  . Benign essential HTN   . Chest pain 08/23/2019  . Chronic heart failure with preserved EF 05/30/2019   Echo 07/2019: EF 55-60, GR 1 DD  . Chronic pain syndrome   . Cirrhosis of liver without ascites ([place])   . Class 2 severe obesity due to excess calories with serious comorbidity and body mass index (BMI) of 39.0 to 39.9 in adult ([place]) 05/30/2019  . Debility 03/13/2019  . Drug induced constipation   . Dyspnea   . Fatty liver disease, nonalcoholic   . GIB (gastrointestinal bleeding) 03/04/2019  . Heart failure, systolic, acute ([place]) 4/69/6295  . History of pulmonary embolism 08/12/2019  . Hyperlipidemia 01/24/2019  . Hypertension 01/24/2019  . Hypoalbuminemia due to protein-calorie malnutrition ([place])   . Hypokalemia   . Hyponatremia   . Hypotension due to drugs   . Insomnia 01/24/2019  . Lactic acidosis 08/23/2019  . Lumbar disc herniation 01/24/2019  . Mixed hyperlipidemia 01/24/2019  . Morbid obesity with BMI of 40.0-44.9, adult ([place]) 05/30/2019  . Normocytic anemia 06/08/2019  . Occasional tremors 08/12/2019  . OSA (obstructive sleep apnea) 08/12/2019  . Peripheral edema   . Portal hypertensive gastropathy ([place]) 03/06/2019   Seen on EGD 03/06/2019  . Pulmonary emboli ([place]) 06/08/2019  . Pulmonary embolism ([place]) 06/08/2019  . Redness and swelling of lower leg 08/23/2019  . Shock ([place])   . Thrombocytopenia ([place])     Current Outpatient Medications  Medication Sig Dispense Refill  . acetaminophen (TYLENOL) 325 MG tablet Take 2 tablets (650 mg total) by mouth every 4 (four) hours as needed for fever or mild pain.    Marland Kitchen apixaban (ELIQUIS) 5 MG TABS tablet Take 1 tablet (5 mg total) by mouth 2 (two) times daily. 60 tablet 0  . Buprenorphine HCl-Naloxone HCl 8-2 MG FILM Place 1 tablet under the tongue in the morning and at bedtime.     . diphenhydrAMINE (BENADRYL)  25 MG tablet Take 25 mg by mouth every 6 (six) hours as needed.    . gabapentin (NEURONTIN) 800 MG tablet Take 1 tablet (800 mg total) by mouth 3 (three) times daily. 90 tablet 1  . lisinopril (ZESTRIL) 5 MG tablet Take 1 tablet (5 mg total) by mouth daily. 90 tablet 3  . metoprolol tartrate (LOPRESSOR) 100 MG tablet Take 1 tablet (100 mg total) by mouth once for 1 dose. Two hours before ct 1 tablet 0  . pantoprazole (PROTONIX) 40 MG tablet Take 40 mg by mouth 2 (two) times daily.    . polyethylene glycol (MIRALAX / GLYCOLAX) 17 g packet Take 17 g by mouth daily. (Patient taking differently: Take 17 g by mouth daily as needed for mild constipation. ) 14 each 0  . pravastatin (PRAVACHOL) 20 MG tablet Take 1 tablet (20 mg total) by mouth daily. 30 tablet 1  . spironolactone (ALDACTONE) 25 MG tablet Take 1  tablet (25 mg total) by mouth daily. 90 tablet 3  . torsemide (DEMADEX) 20 MG tablet Take 2 tablets (40 mg total) by mouth every morning AND 1 tablet (20 mg total) every evening. 90 tablet 5  . VENTOLIN HFA 108 (90 Base) MCG/ACT inhaler Inhale 1 puff into the lungs every 4 (four) hours as needed for shortness of breath. 6.7 g 1  . zolpidem (AMBIEN) 10 MG tablet Take 10 mg by mouth at bedtime as needed for sleep.      No current facility-administered medications for this encounter.    Allergies  Allergen Reactions  . Penicillins     Did it involve swelling of the face/tongue/throat, SOB, or low BP? N/A Did it involve sudden or severe rash/hives, skin peeling, or any reaction on the inside of your mouth or nose? N/A Did you need to seek medical attention at a hospital or doctor's office?N/A When did it last happen?N/A If all above answers are "NO", may proceed with cephalosporin use.      Social History   Socioeconomic History  . Marital status: Unknown    Spouse name: Not on file  . Number of children: Not on file  . Years of education: Not on file  . Highest education level: Not on  file  Occupational History  . Not on file  Tobacco Use  . Smoking status: Never Smoker  . Smokeless tobacco: Never Used  Vaping Use  . Vaping Use: Never used  Substance and Sexual Activity  . Alcohol use: Never  . Drug use: Not Currently    Types: Marijuana    Comment: Smoked for 30 years  . Sexual activity: Not on file  Other Topics Concern  . Not on file  Social History Narrative  . Not on file   Social Determinants of Health   Financial Resource Strain:   . Difficulty of Paying Living Expenses:   Food Insecurity:   . Worried About Charity fundraiser in the Last Year:   . Arboriculturist in the Last Year:   Transportation Needs:   . Film/video editor (Medical):   Marland Kitchen Lack of Transportation (Non-Medical):   Physical Activity:   . Days of Exercise per Week:   . Minutes of Exercise per Session:   Stress:   . Feeling of Stress :   Social Connections:   . Frequency of Communication with Friends and Family:   . Frequency of Social Gatherings with Friends and Family:   . Attends Religious Services:   . Active Member of Clubs or Organizations:   . Attends Archivist Meetings:   Marland Kitchen Marital Status:   Intimate Partner Violence:   . Fear of Current or Ex-Partner:   . Emotionally Abused:   Marland Kitchen Physically Abused:   . Sexually Abused:       Family History  Problem Relation Age of Onset  . Hyperlipidemia Mother   . Hypertension Mother   . Stroke Mother   . CAD Maternal Grandmother   . CAD Maternal Grandfather     Vitals:   10/05/19 1409  BP: 118/64  Pulse: 93  SpO2: 95%  Weight: (!) 171.5 kg    PHYSICAL EXAM: General:  Big and tall, well appearing male.  No respiratory difficulty HEENT: normal Neck: supple. no JVD. Carotids 2+ bilat; no bruits. No lymphadenopathy or thyromegaly appreciated. Cor: PMI nondisplaced. Regular rate & rhythm. No rubs, gallops or murmurs. Lungs: clear Abdomen: soft, nontender, nondistended. No hepatosplenomegaly. No  bruits  or masses. Good bowel sounds. Extremities: no cyanosis, clubbing, rash, trace-1 + bilateral LEE w/ chronic venous statsis dermatitis edema Neuro: alert & oriented x 3, cranial nerves grossly intact. moves all 4 extremities w/o difficulty. Affect pleasant.    ASSESSMENT & PLAN:  1. Chronic Diastolic Heart Failure w/ Suspected Prominent RV Dysfunction  - Echo 4/21 EF 55-60%. RV not well visualized but more features of R>L sided Heart failure  - RHC 4/21 did show normal filling pressures and normal PA pressure and CO but does have known pulmonary disease w/ recent diagnosis of PE 2/21 and mild obstructive pulmonary disease by PFTs. Also suspect likely component of OSA based on body habitus.  - sleep study has been ordered to r/o OSA. Pending  - V/Q scan 6/21 negative for chronic thromboembolic disease - Remains symptomatic w/ NYHA Class III symptoms, mildly volume overloaded on exam. +6 lb above dry wt  - Increase torsemide to 40 mg bid x 2 days, then return to 40 mg qam/ 20 mg qpm - Continue spironolactone 25 mg daily  - given multiple hospitalizations for a/c CHF in the last 6 months + NYHA Class III symptoms, we discussed cardiomems implant and he is interested. Will start approval process - he was given Rx for medical grade compression stockings  - we discussed low sodium diet and fluid restriction  - check BMP today    F/u in 4 weeks to reassess volume status. Has appt scheduled w/ Dr. [name]

## 2019-10-06 DIAGNOSIS — I50813 Acute on chronic right heart failure: Secondary | ICD-10-CM | POA: Diagnosis not present

## 2019-10-06 DIAGNOSIS — E871 Hypo-osmolality and hyponatremia: Secondary | ICD-10-CM | POA: Diagnosis not present

## 2019-10-06 DIAGNOSIS — I110 Hypertensive heart disease with heart failure: Secondary | ICD-10-CM | POA: Diagnosis not present

## 2019-10-06 DIAGNOSIS — I5032 Chronic diastolic (congestive) heart failure: Secondary | ICD-10-CM | POA: Diagnosis not present

## 2019-10-06 DIAGNOSIS — E872 Acidosis: Secondary | ICD-10-CM | POA: Diagnosis not present

## 2019-10-07 ENCOUNTER — Encounter (INDEPENDENT_AMBULATORY_CARE_PROVIDER_SITE_OTHER): Payer: Medicare Other | Admitting: Cardiology

## 2019-10-07 DIAGNOSIS — G4734 Idiopathic sleep related nonobstructive alveolar hypoventilation: Secondary | ICD-10-CM

## 2019-10-07 DIAGNOSIS — G4733 Obstructive sleep apnea (adult) (pediatric): Secondary | ICD-10-CM

## 2019-10-09 DIAGNOSIS — I50813 Acute on chronic right heart failure: Secondary | ICD-10-CM | POA: Diagnosis not present

## 2019-10-09 DIAGNOSIS — E872 Acidosis: Secondary | ICD-10-CM | POA: Diagnosis not present

## 2019-10-09 DIAGNOSIS — I5032 Chronic diastolic (congestive) heart failure: Secondary | ICD-10-CM | POA: Diagnosis not present

## 2019-10-09 DIAGNOSIS — E871 Hypo-osmolality and hyponatremia: Secondary | ICD-10-CM | POA: Diagnosis not present

## 2019-10-09 DIAGNOSIS — I110 Hypertensive heart disease with heart failure: Secondary | ICD-10-CM | POA: Diagnosis not present

## 2019-10-10 ENCOUNTER — Ambulatory Visit: Payer: Medicare Other

## 2019-10-10 NOTE — Procedures (Signed)
    Sleep Study Report  Patient Information Name: Charles Gray  ID: [id number] Birth Date: 1956/04/06  Age: 64  Gender: Male BMI: 41.7 (W=379 lb, H=6' 8'') Study Date: 10/07/2019 Referring Physician:Dalton [name], MD/Kardi Tobb, DO  TEST DESCRIPTION: Home sleep apnea testing was completed using the WatchPat, a Type 1 device, utilizing  peripheral arterial tonometry (PAT), chest movement, actigraphy, pulse oximetry, pulse rate, body position and snore.  AHI was calculated with apnea and hypopnea using valid sleep time as the denominator. RDI includes apneas,  hypopneas, and RERAs. The data acquired and the scoring of sleep and all associated events were performed in  accordance with the recommended standards and specifications as outlined in the AASM Manual for the Scoring of  Sleep and Associated Events 2.2.0 (2015).  FINDINGS: 1. Mild Obstructive Sleep Apnea with AHI 10.7/hr.  2. NoCentral Sleep Apnea with pAHIc 0/hr. 3. Oxygen desaturations as low as 85%. 4. Severe snoring was present. O2 sats were < 88% for 7.67mnutes. 5. Total sleep time was 2 hrs and 49 min. 6. 19.1% of total sleep time was spent in REM sleep.  7. Shortened sleep onset latency at 8 min.  8. Prolonged REM sleep onset latency at 115 min.  9. Total awakenings were 1.   DIAGNOSIS:  Mild Obstructive Sleep Apnea (G47.33)  RECOMMENDATIONS: 1. Clinical correlation of these findings is necessary. The decision to treat obstructive sleep apnea (OSA) is usually  based on the presence of apnea symptoms or the presence of associated medical conditions such as Hypertension,  Congestive Heart Failure, Atrial Fibrillation or Obesity. The most common symptoms of OSA are snoring, gasping for  breath while sleeping, daytime sleepiness and fatigue.   2. Initiating apnea therapy is recommended given the presence of symptoms and/or associated conditions.   Recommend proceeding with one of the following:  a. Auto-CPAP  therapy with a pressure range of 5-20cm H2O.   b. An oral appliance (OA) that can be obtained from certain dentists with expertise in sleep medicine. These are  primarily of use in non-obese patients with mild and moderate disease.   c. An ENT consultation which may be useful to look for specific causes of obstruction and possible treatment  Options.   d. If patient is intolerant to PAP therapy, consider referral to ENT for evaluation for hypoglossal nerve stimulator.   3. Close follow-up is necessary to ensure success with CPAP or oral appliance therapy for maximum benefit .  4. A follow-up oximetry study on CPAP is recommended to assess the adequacy of therapy and determine the need  for supplemental oxygen or the potential need for Bi-level therapy. An arterial blood gas to determine the adequacy of  baseline ventilation and oxygenation should also be considered.  5. Healthy sleep recommendations include: adequate nightly sleep (normal 7-9 hrs/night), avoidance of caffeine after  noon and alcohol near bedtime, and maintaining a sleep environment that is cool, dark and quiet.  6. Weight loss for overweight patients is recommended. Even modest amounts of weight loss can significantly  improve the severity of sleep apnea.  7. Snoring recommendations include: weight loss where appropriate, side sleeping, and avoidance of alcohol before  Bed.  8. Operation of motor vehicle or dangerous equipment must be avoided when feeling drowsy, excessively sleepy, or  mentally fatigued.  Report prepared by: Signature: TFransico Him MD [facility] Electronically Signed: Oct 10, 2019

## 2019-10-11 ENCOUNTER — Telehealth (HOSPITAL_COMMUNITY): Payer: Self-pay | Admitting: *Deleted

## 2019-10-11 NOTE — Telephone Encounter (Signed)
Attempted to call patient regarding upcoming cardiac CT appointment. Left message on voicemail with name and callback number  Lyne Khurana Tai RN Navigator Cardiac Imaging [facility] [phone] Office [phone] Cell  

## 2019-10-11 NOTE — Telephone Encounter (Signed)
Patient returning call regarding upcoming cardiac imaging study; pt verbalizes understanding of appt date/time, parking situation and where to check in, pre-test NPO status and medications ordered, and verified current allergies; name and call back number provided for further questions should they arise ° °Anneka Studer Tai RN Navigator Cardiac Imaging °[facility] °[phone] office °[phone] cell ° °

## 2019-10-12 ENCOUNTER — Ambulatory Visit (HOSPITAL_COMMUNITY)
Admission: RE | Admit: 2019-10-12 | Discharge: 2019-10-12 | Disposition: A | Discharge status: Home / Self Care | Payer: Medicare HMO | Source: Ambulatory Visit | Attending: Cardiology | Admitting: Cardiology

## 2019-10-12 ENCOUNTER — Other Ambulatory Visit: Payer: Self-pay

## 2019-10-12 ENCOUNTER — Telehealth: Payer: Self-pay | Admitting: *Deleted

## 2019-10-12 ENCOUNTER — Encounter: Payer: Medicare Other | Admitting: *Deleted

## 2019-10-12 DIAGNOSIS — R079 Chest pain, unspecified: Secondary | ICD-10-CM | POA: Insufficient documentation

## 2019-10-12 DIAGNOSIS — Z006 Encounter for examination for normal comparison and control in clinical research program: Secondary | ICD-10-CM

## 2019-10-12 DIAGNOSIS — R072 Precordial pain: Secondary | ICD-10-CM | POA: Diagnosis present

## 2019-10-12 MED ORDER — DILTIAZEM HCL 25 MG/5ML IV SOLN
5.0000 mg | Freq: Once | INTRAVENOUS | Status: DC
  Filled 2019-10-12: qty 5

## 2019-10-12 MED ORDER — METOPROLOL TARTRATE 5 MG/5ML IV SOLN
5.0000 mg | INTRAVENOUS | Status: DC | PRN
  Administered 2019-10-12 (×3): 5 mg via INTRAVENOUS

## 2019-10-12 MED ORDER — IOHEXOL 350 MG/ML SOLN
100.0000 mL | Freq: Once | INTRAVENOUS | Status: AC | PRN
  Administered 2019-10-12: 100 mL via INTRAVENOUS

## 2019-10-12 MED ORDER — DILTIAZEM HCL 25 MG/5ML IV SOLN
INTRAVENOUS | Status: AC
  Filled 2019-10-12: qty 5

## 2019-10-12 MED ORDER — NITROGLYCERIN 0.4 MG SL SUBL: 0.8000 mg | SUBLINGUAL_TABLET | Freq: Once | SUBLINGUAL | Status: DC

## 2019-10-12 MED ORDER — METOPROLOL TARTRATE 5 MG/5ML IV SOLN
INTRAVENOUS | Status: AC
  Filled 2019-10-12: qty 15

## 2019-10-12 NOTE — Telephone Encounter (Signed)
-----   Message from Sueanne Margarita, MD sent at 10/10/2019 10:35 PM EDT ----- Please let patient know that they have significant sleep apnea and will be set up with PAP unit.  Please let DME know that order is in EPIC.  Please set patient up for OV in [redacted] weeks along with an overnight pulse ox on PAP

## 2019-10-12 NOTE — Telephone Encounter (Signed)
Informed patient of sleep study results and patient understanding was verbalized. Patient understands his sleep study showed they have significant sleep apnea and will be set up with PAP unit. Please let DME know that order is in EPIC. Please set patient up for OV in [redacted] weeks along with an overnight pulse ox on PAP      Pt is aware and agreeable to his results.  Upon patient request DME selection is [facility]. Patient understands he will be contacted by [facility] to set up his cpap. Patient understands to call if [facility] does not contact him with new setup in a timely manner. Patient understands they will be called once confirmation has been received from [facility] that they have received their new machine to schedule 10 week follow up appointment.  Patient understands he will also have an overnight pulse ox on his pap  [facility] notified of new cpap order  Please add to airview Patient was grateful for the call and thanked me.

## 2019-10-13 ENCOUNTER — Ambulatory Visit: Payer: Medicare Other | Admitting: Cardiology

## 2019-10-18 ENCOUNTER — Encounter: Payer: Self-pay | Admitting: Cardiology

## 2019-10-18 ENCOUNTER — Other Ambulatory Visit: Payer: Self-pay

## 2019-10-18 ENCOUNTER — Ambulatory Visit (INDEPENDENT_AMBULATORY_CARE_PROVIDER_SITE_OTHER): Payer: Medicare HMO | Admitting: Cardiology

## 2019-10-18 ENCOUNTER — Telehealth: Payer: Self-pay

## 2019-10-18 VITALS — BP 146/70 | HR 90 | Ht >= 80 in | Wt 377.8 lb

## 2019-10-18 DIAGNOSIS — E782 Mixed hyperlipidemia: Secondary | ICD-10-CM

## 2019-10-18 DIAGNOSIS — I2510 Atherosclerotic heart disease of native coronary artery without angina pectoris: Secondary | ICD-10-CM

## 2019-10-18 DIAGNOSIS — R931 Abnormal findings on diagnostic imaging of heart and coronary circulation: Secondary | ICD-10-CM

## 2019-10-18 DIAGNOSIS — I10 Essential (primary) hypertension: Secondary | ICD-10-CM

## 2019-10-18 DIAGNOSIS — I471 Supraventricular tachycardia: Secondary | ICD-10-CM

## 2019-10-18 DIAGNOSIS — G4733 Obstructive sleep apnea (adult) (pediatric): Secondary | ICD-10-CM

## 2019-10-18 DIAGNOSIS — I5032 Chronic diastolic (congestive) heart failure: Secondary | ICD-10-CM | POA: Diagnosis not present

## 2019-10-18 MED ORDER — RANOLAZINE ER 500 MG PO TB12
500.0000 mg | ORAL_TABLET | Freq: Two times a day (BID) | ORAL | 3 refills | Status: DC
Start: 2019-10-18 — End: 2020-01-05

## 2019-10-18 NOTE — Progress Notes (Signed)
Cardiology Office Note:    Date:  10/18/2019   ID:  Charles Gray, DOB 1955/04/16, MRN [id number]  PCP:  Enid Skeens., MD  Cardiologist:  Berniece Salines, DO  Electrophysiologist:  None   Referring MD: Enid Skeens., MD   " I am still short of breath"  History of Present Illness:    Charles Gray is a 64 y.o. 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  Patient was initially seen by me on January 24, 2019 at which time he complained of shortness of breath and fatigue. He did tell me that he was experiencing orthopnea and PND. Therefore at the conclusion of the visit start patient on Lasix 40 mg every 12 hours with potassium 20 mEq daily. An echocardiogram was also ordered and patient was referred to GI for his alcoholic fatty liver disease.  I did see the patient, he appears to be losing weight on the diuretic therapy. At his initial visit he was 410 LBSand his visit onOctober 20, 2020 he was 405 pounds. Although the weight loss the patient still was not euvolemic therefore recommended the patient go to the ED at the [facility] to be given IV diuretics but he declined at that time.  I did see the patient again on February 27, 2019 at that time recommended hospitalization and he declined. I didincrease his lasix doseto 80 mg in the morning and 40 mg at night with metolazone.  He called on 03/02/2019 requestingan increase in his diuretics dose at that time I did tell the patient he needed to go to the ED- Thankfully he did go to the ED.  [facility] then [facility]. During his hospital course acute drop in blood  pressure as well as hemoglobin with melanotic stool. He had increasing leukocytosis and lactic acidosis but was afebrile. Placed on broad-spectrum antibiotics. CT of abdomen pelvis abdominal CTA without obvious source of bleeding. Hemoglobin did drop from 14-7.5. He was given 3 units packed red blood cells placed on Protonix and transferred to [facility] 03/04/2019 for further evaluation. Patient did require intubation for airway protection. Upper GI endoscopy 03/05/2019 per gastroenterology Dr. Pura Spice no varices seen. A large amount of fresh and clotted blood was found in the gastric fundus and in the proximal gastric body. Very mild portal hypertensive gastropathy. There was a chain of lesions on a fold in the distal gastric fundus with purple discoloration. Patient self extubated 03/06/2019. Bouts initially of confusion agitation cranial CT scan 03/07/2019 showed no acute findings. Hemoglobin stabilized 8.4.He was notedto havethrombocytopenia improved 111000-136000 felt to be reactive versus cirrhosis. He was discharged torehab. He was able to complete his rehab.  I did see the patient 05/30/2019 post hospitalization, at that visit I restarted his lasix 89m twice a day and stoppedhis metolazone. Blood work was also done. During our visit I encouraged the patient to decrease his salt intake as he was eating Ramen noodles every day.  I last saw that patient on 06/27/2019. Prior to that visit he has been started on Eliquis for pulmonary embolism.  [facility] at that time a he was treated for heart failure he underwent a right heart catheterization.  He also had a pulmonary function test which showed mild obstruction.  I saw the patient on 08/18/2019 at that time he was euvolemic, but I was concern about left leg swelling - he was started on antibiotic for concern for cellulitis.   He again went to [facility] hospital on 08/23/2019 at  that time he received IV Lasix. In the meantime DVT was also ruled out with venous duplex ultrasound. After his discharge he did she the advanced chf clinic, during this time implantation of the cardiomems device was discussed. His Lasix was changed to torsemide and aldactone was also added. Because of the shortness of breath V/Q scan was also ordered and rule out CTEPH.    At his last visit he reported that he had been experiencing new symptoms of intermittent chest pain.  Given his risk factors I recommended patient undergo a coronary CTA.    In the interim the patient was able to get a sleep study which she was diagnosed with obstructive sleep apnea he still is pending treatment plan.  He also was able to get his coronary CTA.  His VQ scan did not show any evidence of CTEPH.    He is here today he tells me that he has been experiencing intermittent chest pain, but still is short of breath at baseline.  Past Medical History:  Diagnosis Date   Acute on chronic congestive heart failure (HCC)    Acute on chronic diastolic CHF (congestive heart failure) ([place]) 08/23/2019   Acute on chronic right-sided heart failure ([place]) 08/06/2019   Acute respiratory failure with hypoxia (HCC)    Anemia of chronic disease    Atrial tachycardia ([place]) 08/12/2019   Benign essential HTN    Chest pain 08/23/2019   Chronic heart failure with preserved EF 05/30/2019   Echo 07/2019: EF 55-60, GR 1 DD   Chronic pain syndrome    Cirrhosis of liver without ascites (HCC)    Class 2 severe obesity due to excess calories with serious comorbidity and body mass index (BMI) of 39.0 to 39.9 in adult ([place]) 05/30/2019   Debility 03/13/2019   Drug induced constipation    Dyspnea    Fatty liver disease, nonalcoholic    GIB (gastrointestinal bleeding) 03/04/2019   Heart failure, systolic, acute ([place]) 2/95/2841   History of pulmonary embolism 08/12/2019   Hyperlipidemia 01/24/2019   Hypertension 01/24/2019    Hypoalbuminemia due to protein-calorie malnutrition (HCC)    Hypokalemia    Hyponatremia    Hypotension due to drugs    Insomnia 01/24/2019   Lactic acidosis 08/23/2019   Lumbar disc herniation 01/24/2019   Mixed hyperlipidemia 01/24/2019   Morbid obesity with BMI of 40.0-44.9, adult ([place]) 05/30/2019   Normocytic anemia 06/08/2019   Occasional tremors 08/12/2019   OSA (obstructive sleep apnea) 08/12/2019   Peripheral edema    Portal hypertensive gastropathy ([place]) 03/06/2019   Seen on EGD 03/06/2019   Pulmonary emboli ([place]) 06/08/2019   Pulmonary embolism ([place]) 06/08/2019   Redness and swelling of lower leg 08/23/2019   Shock ([place])    Thrombocytopenia ([place])     Past Surgical History:  Procedure Laterality Date   ESOPHAGOGASTRODUODENOSCOPY (EGD) WITH PROPOFOL N/A 03/05/2019   Procedure: ESOPHAGOGASTRODUODENOSCOPY (EGD) WITH PROPOFOL;  Surgeon: Doran Stabler, MD;  Location: [place];  Service: Gastroenterology;  Laterality: N/A;  Large Blood Clot Removed   ESOPHAGOGASTRODUODENOSCOPY (EGD) WITH PROPOFOL N/A 03/06/2019   Procedure: ESOPHAGOGASTRODUODENOSCOPY (EGD) WITH PROPOFOL;  Surgeon: Thornton Park, MD;  Location: [place];  Service: Gastroenterology;  Laterality: N/A;   HEMORROIDECTOMY     RIGHT HEART CATH N/A 08/09/2019   Procedure: RIGHT HEART CATH;  Surgeon: Larey Dresser, MD;  Location: [place] [facility];  Service: Cardiovascular;  Laterality: N/A;    Current Medications: Current Meds  Medication Sig   acetaminophen (TYLENOL) 325 MG tablet Take 2 tablets (650 mg total) by mouth every 4 (four) hours as needed for fever or mild pain.   apixaban (ELIQUIS) 5 MG TABS tablet Take 1 tablet (5 mg total) by mouth 2 (two) times daily.   Buprenorphine HCl-Naloxone HCl 8-2 MG FILM Place 1 tablet under the tongue in the morning and at bedtime.    diphenhydrAMINE (BENADRYL) 25 MG tablet Take 25 mg by mouth every 6 (six) hours as needed.   gabapentin  (NEURONTIN) 800 MG tablet Take 1 tablet (800 mg total) by mouth 3 (three) times daily.   lisinopril (ZESTRIL) 5 MG tablet Take 1 tablet (5 mg total) by mouth daily.   pantoprazole (PROTONIX) 40 MG tablet Take 40 mg by mouth 2 (two) times daily.   polyethylene glycol (MIRALAX / GLYCOLAX) 17 g packet Take 17 g by mouth daily. (Patient taking differently: Take 17 g by mouth daily as needed for mild constipation. )   pravastatin (PRAVACHOL) 20 MG tablet Take 1 tablet (20 mg total) by mouth daily.   spironolactone (ALDACTONE) 25 MG tablet Take 1 tablet (25 mg total) by mouth daily.   torsemide (DEMADEX) 20 MG tablet Take 2 tablets (40 mg total) by mouth every morning AND 1 tablet (20 mg total) every evening.   VENTOLIN HFA 108 (90 Base) MCG/ACT inhaler Inhale 1 puff into the lungs every 4 (four) hours as needed for shortness of breath.   zolpidem (AMBIEN) 10 MG tablet Take 10 mg by mouth at bedtime as needed for sleep.      Allergies:   Penicillins   Social History   Socioeconomic History   Marital status: Unknown    Spouse name: Not on file   Number of children: Not on file   Years of education: Not on file   Highest education level: Not on file  Occupational History   Not on file  Tobacco Use   Smoking status: Never Smoker   Smokeless tobacco: Never Used  Vaping Use   Vaping Use: Never used  Substance and Sexual Activity   Alcohol use: Never   Drug use: Not Currently    Types: Marijuana    Comment: Smoked for 30 years   Sexual activity: Not on file  Other Topics Concern   Not on file  Social History Narrative   Not on file   Social Determinants of Health   Financial Resource Strain:    Difficulty of Paying Living Expenses:   Food Insecurity:    Worried About Charity fundraiser in the Last Year:    Arboriculturist in the Last Year:   Transportation Needs:    Film/video editor (Medical):    Lack of Transportation (Non-Medical):   Physical  Activity:    Days of Exercise per Week:    Minutes of Exercise per Session:   Stress:    Feeling of Stress :   Social Connections:    Frequency of Communication with Friends and Family:    Frequency of Social Gatherings with Friends and Family:    Attends Religious Services:    Active Member of Clubs or Organizations:    Attends [employer]  or Organization Meetings:    Marital Status:      Family History: The patient's family history includes CAD in his maternal grandfather and maternal grandmother; Hyperlipidemia in his mother; Hypertension in his mother; Stroke in his mother.  ROS:   Review of Systems  Constitution: Negative for decreased appetite, fever and weight gain.  HENT: Negative for congestion, ear discharge, hoarse voice and sore throat.   Eyes: Negative for discharge, redness, vision loss in right eye and visual halos.  Cardiovascular: Reports chest pain and, dyspnea on exertion, leg swelling.  Negative for, orthopnea and palpitations.  Respiratory: Negative for cough, hemoptysis, shortness of breath and snoring.   Endocrine: Negative for heat intolerance and polyphagia.  Hematologic/Lymphatic: Negative for bleeding problem. Does not bruise/bleed easily.  Skin: Negative for flushing, nail changes, rash and suspicious lesions.  Musculoskeletal: Negative for arthritis, joint pain, muscle cramps, myalgias, neck pain and stiffness.  Gastrointestinal: Negative for abdominal pain, bowel incontinence, diarrhea and excessive appetite.  Genitourinary: Negative for decreased libido, genital sores and incomplete emptying.  Neurological: Negative for brief paralysis, focal weakness, headaches and loss of balance.  Psychiatric/Behavioral: Negative for altered mental status, depression and suicidal ideas.  Allergic/Immunologic: Negative for HIV exposure and persistent infections.    EKGs/Labs/Other Studies Reviewed:    The following studies were reviewed today:   EKG: None  today  CCTa IMPRESSION: 1. Coronary calcium score of 0. This was 0 percentile for age and sex matched control.  2. Normal coronary origin with right dominance.  3. Mild Coronary Artery Disease. CADRADS 2. This study will be sent for FFR.  Bellamie Turney, DO   V/Q scan  IMPRESSION: 1. No segmental perfusion defects identified bilaterally to suggest acute pulmonary embolus or chronic thromboembolic disease.   Recent Labs: 02/27/2019: NT-Pro BNP 37 03/05/2019: TSH 0.809 08/30/2019: Hemoglobin 10.7; Platelets 129 09/05/2019: ALT 22; B Natriuretic Peptide 50.6 09/18/2019: Magnesium 2.2 10/05/2019: BUN 16; Creatinine, Ser 1.03; Potassium 4.3; Sodium 137  Recent Lipid Panel    Component Value Date/Time   CHOL 128 01/24/2019 1121   TRIG 161 (H) 03/07/2019 0721   HDL 42 01/24/2019 1121   CHOLHDL 3.0 01/24/2019 1121   LDLCALC 64 01/24/2019 1121    Physical Exam:    VS:  BP (!) 146/70    Pulse 90    Ht 6' 10"  (2.083 m)    Wt (!) 377 lb 12.8 oz (171.4 kg)    SpO2 96%    BMI 39.50 kg/m     Wt Readings from Last 3 Encounters:  10/18/19 (!) 377 lb 12.8 oz (171.4 kg)  10/05/19 (!) 378 lb (171.5 kg)  09/18/19 (!) 371 lb 12.8 oz (168.6 kg)     GEN: Well nourished, well developed in no acute distress HEENT: Normal NECK: No JVD; No carotid bruits LYMPHATICS: No lymphadenopathy CARDIAC: S1S2 noted,RRR, no murmurs, rubs, gallops RESPIRATORY:  Clear to auscultation without rales, wheezing or rhonchi  ABDOMEN: Soft, non-tender, non-distended, +bowel sounds, no guarding. EXTREMITIES: No edema, No cyanosis, no clubbing MUSCULOSKELETAL:  No deformity  SKIN: Warm and dry NEUROLOGIC:  Alert and oriented x 3, non-focal PSYCHIATRIC:  Normal affect, good insight  ASSESSMENT:    1. Benign essential HTN   2. Chronic heart failure with preserved ejection fraction ([place])   3. Atrial tachycardia ([place])   4. OSA (obstructive sleep apnea)   5. Mixed hyperlipidemia   6. Mild CAD    PLAN:      He still has some chest pain, his  coronary CTA does show evidence of mild coronary artery disease.  For this I am getting started patient on Ranexa 500 mg daily to see if this will help to improve his pain.  Given his significant recent GI bleeding history and currently on Eliquis and holding of antiplatelet therapy for now.  He is on pravastatin 20 mg daily.  In terms of his chronic diastolic heart failure.  He appears to be at his baseline.  We will continue the patient on torsemide 40 mg every morning and 20 mg in the evening.  He also take Aldactone.  He has been following up with our heart failure clinic for potential plans for CardioMEMS.  But he tells me that he is apprehensive about getting this device.  I was able to answer his questions again about CardioMEMS and why is helpful.  He has recently been diagnosed with sleep apnea he needs to go up for treatment plan.  We discussed that his shortness of breath is truly multifactorial and he understands that also weight loss may help him as well.  But for now he has got the COPD, pulmonary history of pulmonary embolism as well as diastolic heart failure.  He may benefit from cardiac rehab.  Hypertension-slightly elevated blood pressure which is later reading we will continue to monitor patient and if need be and we will adjust his medications as appropriate.   The patient is in agreement with the above plan. The patient left the office in stable condition.  The patient will follow up in 3 months or sooner if needed.   Medication Adjustments/Labs and Tests Ordered: Current medicines are reviewed at length with the patient today.  Concerns regarding medicines are outlined above.  No orders of the defined types were placed in this encounter.  Meds ordered this encounter  Medications   ranolazine (RANEXA) 500 MG 12 hr tablet    Sig: Take 1 tablet (500 mg total) by mouth 2 (two) times daily.    Dispense:  180 tablet    Refill:  3     Patient Instructions  Medication Instructions:  Your physician has recommended you make the following change in your medication:  START: Ranexa 500 mg take one tablet by mouth twice daily.  *If you need a refill on your cardiac medications before your next appointment, please call your pharmacy*   Lab Work: None If you have labs (blood work) drawn today and your tests are completely normal, you will receive your results only by:  [place] (if you have MyChart) OR  A paper copy in the mail If you have any lab test that is abnormal or we need to change your treatment, we will call you to review the results.   Testing/Procedures: None   Follow-Up: At [facility], you and your health needs are our priority.  As part of our continuing mission to provide you with exceptional heart care, we have created designated Provider Care Teams.  These Care Teams include your primary Cardiologist (physician) and Advanced Practice Providers (APPs -  Physician Assistants and Nurse Practitioners) who all work together to provide you with the care you need, when you need it.  We recommend signing up for the patient portal called "MyChart".  Sign up information is provided on this After Visit Summary.  MyChart is used to connect with patients for Virtual Visits (Telemedicine).  Patients are able to view lab/test results, encounter notes, upcoming appointments, etc.  Non-urgent messages can be sent to your provider  as well.   To learn more about what you can do with MyChart, go to [email or web address].    Your next appointment:   3 month(s)  The format for your next appointment:   In Person  Provider:   Berniece Salines, DO   Other Instructions      Adopting a Healthy Lifestyle.  Know what a healthy weight is for you (roughly BMI <25) and aim to maintain this   Aim for 7+ servings of fruits and vegetables daily   65-80+ fluid ounces of water or unsweet tea for healthy kidneys    Limit to max 1 drink of alcohol per day; avoid smoking/tobacco   Limit animal fats in diet for cholesterol and heart health - choose grass fed whenever available   Avoid highly processed foods, and foods high in saturated/trans fats   Aim for low stress - take time to unwind and care for your mental health   Aim for 150 min of moderate intensity exercise weekly for heart health, and weights twice weekly for bone health   Aim for 7-9 hours of sleep daily   When it comes to diets, agreement about the perfect plan isnt easy to find, even among the experts. Experts at the [employer] developed an idea known as the Healthy Eating Plate. Just imagine a plate divided into logical, healthy portions.   The emphasis is on diet quality:   Load up on vegetables and fruits - one-half of your plate: Aim for color and variety, and remember that potatoes dont count.   Go for whole grains - one-quarter of your plate: Whole wheat, barley, wheat berries, quinoa, oats, brown rice, and foods made with them. If you want pasta, go with whole wheat pasta.   Protein power - one-quarter of your plate: Fish, chicken, beans, and nuts are all healthy, versatile protein sources. Limit red meat.   The diet, however, does go beyond the plate, offering a few other suggestions.   Use healthy plant oils, such as olive, canola, soy, corn, sunflower and peanut. Check the labels, and avoid partially hydrogenated oil, which have unhealthy trans fats.   If youre thirsty, drink water. Coffee and tea are good in moderation, but skip sugary drinks and limit milk and dairy products to one or two daily servings.   The type of carbohydrate in the diet is more important than the amount. Some sources of carbohydrates, such as vegetables, fruits, whole grains, and beans-are healthier than others.   Finally, stay active  Signed, Berniece Salines, DO  10/18/2019 12:07 PM    [facility]

## 2019-10-18 NOTE — Telephone Encounter (Signed)
error 

## 2019-10-18 NOTE — Patient Instructions (Signed)
Medication Instructions:  Your physician has recommended you make the following change in your medication:  START: Ranexa 500 mg take one tablet by mouth twice daily.  *If you need a refill on your cardiac medications before your next appointment, please call your pharmacy*   Lab Work: None If you have labs (blood work) drawn today and your tests are completely normal, you will receive your results only by: Marland Kitchen MyChart Message (if you have MyChart) OR . A paper copy in the mail If you have any lab test that is abnormal or we need to change your treatment, we will call you to review the results.   Testing/Procedures: None   Follow-Up: At [facility], you and your health needs are our priority.  As part of our continuing mission to provide you with exceptional heart care, we have created designated Provider Care Teams.  These Care Teams include your primary Cardiologist (physician) and Advanced Practice Providers (APPs -  Physician Assistants and Nurse Practitioners) who all work together to provide you with the care you need, when you need it.  We recommend signing up for the patient portal called "MyChart".  Sign up information is provided on this After Visit Summary.  MyChart is used to connect with patients for Virtual Visits (Telemedicine).  Patients are able to view lab/test results, encounter notes, upcoming appointments, etc.  Non-urgent messages can be sent to your provider as well.   To learn more about what you can do with MyChart, go to [email or web address].    Your next appointment:   3 month(s)  The format for your next appointment:   In Person  Provider:   Berniece Salines, DO   Other Instructions

## 2019-10-19 ENCOUNTER — Telehealth: Payer: Self-pay | Admitting: *Deleted

## 2019-10-19 ENCOUNTER — Other Ambulatory Visit: Payer: Self-pay

## 2019-10-19 ENCOUNTER — Ambulatory Visit (HOSPITAL_COMMUNITY)
Admission: RE | Admit: 2019-10-19 | Discharge: 2019-10-19 | Disposition: A | Discharge status: Home / Self Care | Payer: Medicare HMO | Source: Ambulatory Visit | Attending: Cardiology | Admitting: Cardiology

## 2019-10-19 DIAGNOSIS — R079 Chest pain, unspecified: Secondary | ICD-10-CM | POA: Diagnosis present

## 2019-10-19 DIAGNOSIS — R931 Abnormal findings on diagnostic imaging of heart and coronary circulation: Secondary | ICD-10-CM | POA: Insufficient documentation

## 2019-10-19 DIAGNOSIS — I2510 Atherosclerotic heart disease of native coronary artery without angina pectoris: Secondary | ICD-10-CM | POA: Diagnosis not present

## 2019-10-19 NOTE — Telephone Encounter (Signed)
Patient called to say he has new insurance as of 10/12/19. Patient encouraged to call his dme with his new card information to avoid delay in getting his cpap unit. Pt was agreeable to treatment.

## 2019-10-19 NOTE — Research (Signed)
CADFEM Informed Consent                  Subject Name:   Charles Gray   Subject met inclusion and exclusion criteria.  The informed consent form, study requirements and expectations were reviewed with the subject and questions and concerns were addressed prior to the signing of the consent form.  The subject verbalized understanding of the trial requirements.  The subject agreed to participate in the CADFEM trial and signed the informed consent.  The informed consent was obtained prior to performance of any protocol-specific procedures for the subject.  A copy of the signed informed consent was given to the subject and a copy was placed in the subject's medical record.   [place] Tremayne Sheldon, Research Assistant 10/12/2019  11:45 a.m.

## 2019-10-19 NOTE — Telephone Encounter (Signed)
-----   Message from Gita Kudo, RN sent at 10/18/2019 12:04 PM EDT ----- Patient came into our office and stated that he has not yet been fitted for his CPAP. Can you look into this for him please?  Thanks,  Lilia Pro, RN

## 2019-10-26 DIAGNOSIS — R072 Precordial pain: Secondary | ICD-10-CM

## 2019-11-07 ENCOUNTER — Ambulatory Visit (HOSPITAL_COMMUNITY)
Admission: RE | Admit: 2019-11-07 | Discharge: 2019-11-07 | Disposition: A | Discharge status: Home / Self Care | Payer: Medicare HMO | Source: Ambulatory Visit | Attending: Cardiology | Admitting: Cardiology

## 2019-11-07 ENCOUNTER — Other Ambulatory Visit: Payer: Self-pay

## 2019-11-07 ENCOUNTER — Encounter (HOSPITAL_COMMUNITY): Payer: Self-pay | Admitting: Cardiology

## 2019-11-07 VITALS — BP 120/62 | HR 86 | Wt 376.0 lb

## 2019-11-07 DIAGNOSIS — J449 Chronic obstructive pulmonary disease, unspecified: Secondary | ICD-10-CM | POA: Diagnosis not present

## 2019-11-07 DIAGNOSIS — Z86711 Personal history of pulmonary embolism: Secondary | ICD-10-CM | POA: Diagnosis not present

## 2019-11-07 DIAGNOSIS — G4733 Obstructive sleep apnea (adult) (pediatric): Secondary | ICD-10-CM | POA: Diagnosis not present

## 2019-11-07 DIAGNOSIS — I2510 Atherosclerotic heart disease of native coronary artery without angina pectoris: Secondary | ICD-10-CM | POA: Diagnosis not present

## 2019-11-07 DIAGNOSIS — Z8249 Family history of ischemic heart disease and other diseases of the circulatory system: Secondary | ICD-10-CM | POA: Insufficient documentation

## 2019-11-07 DIAGNOSIS — K7581 Nonalcoholic steatohepatitis (NASH): Secondary | ICD-10-CM | POA: Diagnosis not present

## 2019-11-07 DIAGNOSIS — I110 Hypertensive heart disease with heart failure: Secondary | ICD-10-CM | POA: Insufficient documentation

## 2019-11-07 DIAGNOSIS — I5032 Chronic diastolic (congestive) heart failure: Secondary | ICD-10-CM | POA: Diagnosis present

## 2019-11-07 DIAGNOSIS — Z79899 Other long term (current) drug therapy: Secondary | ICD-10-CM | POA: Diagnosis not present

## 2019-11-07 DIAGNOSIS — Z880 Allergy status to penicillin: Secondary | ICD-10-CM | POA: Diagnosis not present

## 2019-11-07 DIAGNOSIS — E785 Hyperlipidemia, unspecified: Secondary | ICD-10-CM | POA: Diagnosis not present

## 2019-11-07 DIAGNOSIS — E669 Obesity, unspecified: Secondary | ICD-10-CM | POA: Diagnosis not present

## 2019-11-07 DIAGNOSIS — K7460 Unspecified cirrhosis of liver: Secondary | ICD-10-CM | POA: Diagnosis not present

## 2019-11-07 DIAGNOSIS — M545 Low back pain: Secondary | ICD-10-CM | POA: Diagnosis not present

## 2019-11-07 DIAGNOSIS — Z7901 Long term (current) use of anticoagulants: Secondary | ICD-10-CM | POA: Diagnosis not present

## 2019-11-07 DIAGNOSIS — Z86718 Personal history of other venous thrombosis and embolism: Secondary | ICD-10-CM | POA: Insufficient documentation

## 2019-11-07 DIAGNOSIS — F419 Anxiety disorder, unspecified: Secondary | ICD-10-CM | POA: Diagnosis not present

## 2019-11-07 DIAGNOSIS — Z8349 Family history of other endocrine, nutritional and metabolic diseases: Secondary | ICD-10-CM | POA: Insufficient documentation

## 2019-11-07 LAB — BASIC METABOLIC PANEL
Anion gap: 7 (ref 5–15)
BUN: 20 mg/dL (ref 8–23)
CO2: 28 mmol/L (ref 22–32)
Calcium: 9.2 mg/dL (ref 8.9–10.3)
Chloride: 99 mmol/L (ref 98–111)
Creatinine, Ser: 0.98 mg/dL (ref 0.61–1.24)
GFR calc Af Amer: >60 mL/min (ref >60)
GFR calc non Af Amer: >60 mL/min (ref >60)
Glucose, Bld: 123 mg/dL — ABNORMAL HIGH (ref 70–99)
Potassium: 4.3 mmol/L (ref 3.5–5.1)
Sodium: 134 mmol/L — ABNORMAL LOW (ref 135–145)

## 2019-11-07 LAB — LIPID PANEL
Cholesterol: 140 mg/dL (ref 0–200)
HDL: 48 mg/dL (ref >40)
LDL Cholesterol: 78 mg/dL (ref 0–99)
Total CHOL/HDL Ratio: 2.9 RATIO
Triglycerides: 70 mg/dL (ref <150)
VLDL: 14 mg/dL (ref 0–40)

## 2019-11-07 LAB — CBC
HCT: 39.7 % (ref 39.0–52.0)
Hemoglobin: 11.9 g/dL — ABNORMAL LOW (ref 13.0–17.0)
MCH: 24.4 pg — ABNORMAL LOW (ref 26.0–34.0)
MCHC: 30 g/dL (ref 30.0–36.0)
MCV: 81.5 fL (ref 80.0–100.0)
Platelets: 141 10*3/uL — ABNORMAL LOW (ref 150–400)
RBC: 4.87 MIL/uL (ref 4.22–5.81)
RDW: 16.8 % — ABNORMAL HIGH (ref 11.5–15.5)
WBC: 7.7 10*3/uL (ref 4.0–10.5)
nRBC: 0 % (ref 0.0–0.2)

## 2019-11-07 LAB — BRAIN NATRIURETIC PEPTIDE: B Natriuretic Peptide: 20.8 pg/mL (ref 0.0–100.0)

## 2019-11-07 MED ORDER — SERTRALINE HCL 25 MG PO TABS
25.0000 mg | ORAL_TABLET | Freq: Every day | ORAL | 5 refills | Status: DC
Start: 2019-11-07 — End: 2019-12-23

## 2019-11-07 NOTE — Patient Instructions (Addendum)
Our CMA Chantel will call you to discuss the Cardiomems.  START Sertraline 59m (1 tab) daily  You were referred to Dr TLandis Gandyoffice for CPAP.  They will call you to discuss  Labs today We will only contact you if something comes back abnormal or we need to make some changes. Otherwise no news is good news!  Your physician recommends that you schedule a follow-up appointment in: 3 months with Dr MAundra Dubin Please call office at [phone] 2 if you have any questions or concerns.   [facility] and your [facility] needs are our priority. As part of our continuing mission to provide you with exceptional heart care, we have created designated Provider Care Teams. These Care Teams include your primary Cardiologist (physician) and Advanced Practice Providers (APPs- Physician Assistants and Nurse Practitioners) who all work together to provide you with the care you need, when you need it.   You may see any of the following providers on your designated Care Team at your next follow up: .Marland KitchenDr DGlori Bickers. Dr DLoralie Champagne. ADarrick Grinder NP . BLyda Jester PA . LAudry Riles PharmD   Please be sure to bring in all your medications bottles to every appointment.

## 2019-11-08 NOTE — Progress Notes (Signed)
ADVANCED HF CLINIC CONSULT NOTE  Primary Care: Enid Skeens., MD Primary Cardiologist: Dr. Berniece Salines, DO  HF Cardiology: Dr. [name]  HPI: 64 y.o. obese male, nonsmoker w/ mild COPD, cirrhosis, DVT/PE 05/2019 treated w/ Eliquis, chronic diastolic HF w/ suspected prominent RV dysfunction presents for followup of CHF.   He has had multiple hospitalizations in 2021 for CHF exacerbation, requiring IV Lasix and change in PO regimen from lasix to torsemide. Echo 4/21 showed normal LVEF 55-60% w/ G1DD. RV not well visualized, but felt to have component of RV failure. [place] 4/21 showed normal PA pressure and normal cardiac output. PFTs also completed and c/w mild obstructive airway disease.   In 7/21, he had a coronary CTA.  This showed no coronary calcium but there was a concern for soft plaque in the distal LCx and distal RCA with hemodynamically significant stenosis.  He was managed medically. He has not had any chest pain, but was started on ranolazine after his CTA.  He feels like it makes his "head fuzzy."    Currently, he is short of breath after walking about 20 yards but is also limited by low back pain.  He uses a walker due to low back pain.  His symptoms have been stable for about a year. He is short of breath walking up stairs and short of breath walking to the mailbox.  He sleeps poorly.  He is very anxious and reports "panic attacks."  He is on apixaban, no BRBPR/melena.  No chest pain.  He does not smoke or drink ETOH.   Labs (6/21): K 4.3, creatinine 1.03  ECG (personally reviewed): NSR, LAFB  PMH: 1. HTN 2. OSA 3. Hyperlipidemia 4. Low back pain 5. PE/DVT in 2/21.  - V/Q scan in 6/21 was not suggestive of chronic PE.  6. Nonalcoholic steatohepatitis.  7. GI bleed in 10/20.  8. Anxiety 9. CAD: Coronary CTA in 7/21 with calcium score 0 but concern for soft plaque distal CFX and RCA.  By [facility], there was flow-limiting stenosis in distal CFX and distal RCA.  10. Chronic  diastolic CHF: Echo in 3/08 with EF 55-60%, RV poorly visualized.  - RHC (4/21): mean RA 8, PA 20/8, mean PCWP 11, CI 2.8 11. PFTs (4/21): Mild obstruction.   Review of Systems: All systems reviewed and negative except as per HPI.    Current Outpatient Medications  Medication Sig Dispense Refill   acetaminophen (TYLENOL) 325 MG tablet Take 2 tablets (650 mg total) by mouth every 4 (four) hours as needed for fever or mild pain.     apixaban (ELIQUIS) 5 MG TABS tablet Take 1 tablet (5 mg total) by mouth 2 (two) times daily. 60 tablet 0   Buprenorphine HCl-Naloxone HCl 8-2 MG FILM Place 1 tablet under the tongue in the morning and at bedtime.      diphenhydrAMINE (BENADRYL) 25 MG tablet Take 25 mg by mouth every 6 (six) hours as needed.     gabapentin (NEURONTIN) 800 MG tablet Take 1 tablet (800 mg total) by mouth 3 (three) times daily. 90 tablet 1   lisinopril (ZESTRIL) 5 MG tablet Take 1 tablet (5 mg total) by mouth daily. 90 tablet 3   metoprolol tartrate (LOPRESSOR) 100 MG tablet Take 1 tablet (100 mg total) by mouth once for 1 dose. Two hours before ct 1 tablet 0   pantoprazole (PROTONIX) 40 MG tablet Take 40 mg by mouth 2 (two) times daily.     polyethylene glycol (MIRALAX /  GLYCOLAX) 17 g packet Take 17 g by mouth daily. (Patient taking differently: Take 17 g by mouth daily as needed for mild constipation. ) 14 each 0   pravastatin (PRAVACHOL) 20 MG tablet Take 1 tablet (20 mg total) by mouth daily. 30 tablet 1   ranolazine (RANEXA) 500 MG 12 hr tablet Take 1 tablet (500 mg total) by mouth 2 (two) times daily. 180 tablet 3   spironolactone (ALDACTONE) 25 MG tablet Take 1 tablet (25 mg total) by mouth daily. 90 tablet 3   torsemide (DEMADEX) 20 MG tablet Take 2 tablets (40 mg total) by mouth every morning AND 1 tablet (20 mg total) every evening. 90 tablet 5   VENTOLIN HFA 108 (90 Base) MCG/ACT inhaler Inhale 1 puff into the lungs every 4 (four) hours as needed for shortness of  breath. 6.7 g 1   zolpidem (AMBIEN) 10 MG tablet Take 10 mg by mouth at bedtime as needed for sleep.      sertraline (ZOLOFT) 25 MG tablet Take 1 tablet (25 mg total) by mouth daily. 30 tablet 5   No current facility-administered medications for this encounter.    Allergies  Allergen Reactions   Penicillins     Did it involve swelling of the face/tongue/throat, SOB, or low BP? N/A Did it involve sudden or severe rash/hives, skin peeling, or any reaction on the inside of your mouth or nose? N/A Did you need to seek medical attention at a hospital or doctor's office?N/A When did it last happen?N/A If all above answers are NO, may proceed with cephalosporin use.      Social History   Socioeconomic History   Marital status: Unknown    Spouse name: Not on file   Number of children: Not on file   Years of education: Not on file   Highest education level: Not on file  Occupational History   Not on file  Tobacco Use   Smoking status: Never Smoker   Smokeless tobacco: Never Used  Vaping Use   Vaping Use: Never used  Substance and Sexual Activity   Alcohol use: Never   Drug use: Not Currently    Types: Marijuana    Comment: Smoked for 30 years   Sexual activity: Not on file  Other Topics Concern   Not on file  Social History Narrative   Not on file   Social Determinants of Health   Financial Resource Strain:    Difficulty of Paying Living Expenses:   Food Insecurity:    Worried About Charity fundraiser in the Last Year:    Arboriculturist in the Last Year:   Transportation Needs:    Film/video editor (Medical):    Lack of Transportation (Non-Medical):   Physical Activity:    Days of Exercise per Week:    Minutes of Exercise per Session:   Stress:    Feeling of Stress :   Social Connections:    Frequency of Communication with Friends and Family:    Frequency of Social Gatherings with Friends and Family:    Attends Religious  Services:    Active Member of Clubs or Organizations:    Attends Music therapist:    Marital Status:   Intimate Partner Violence:    Fear of Current or Ex-Partner:    Emotionally Abused:    Physically Abused:    Sexually Abused:       Family History  Problem Relation Age of Onset   Hyperlipidemia  Mother    Hypertension Mother    Stroke Mother    CAD Maternal Grandmother    CAD Maternal Grandfather     Vitals:   11/07/19 1157  BP: (!) 120/62  Pulse: 86  SpO2: 95%  Weight: (!) 170.6 kg (376 lb)    PHYSICAL EXAM: General: NAD, morbidly obese.  Neck: No JVD, no thyromegaly or thyroid nodule.  Lungs: Clear to auscultation bilaterally with normal respiratory effort. CV: Nondisplaced PMI.  Heart regular S1/S2, no S3/S4, no murmur.  Trace ankle edema.  No carotid bruit.  Normal pedal pulses.  Abdomen: Soft, nontender, no hepatosplenomegaly, no distention.  Skin: Intact without lesions or rashes.  Neurologic: Alert and oriented x 3.  Psych: Normal affect. Extremities: No clubbing or cyanosis.  HEENT: Normal.   ASSESSMENT & PLAN:  1. Chronic diastolic CHF: Echo in 7/16 with EF 55-60%, RV poorly visualized.  [place] in 4/21 actually showed optimized filling pressures and no pulmonary hypertension.  On exam today, he does not look significantly volume overloaded.  He has NYHA class III symptoms.  Obesity and deconditioning likely play a large role, but volume status has been difficult to ascertain.  - Continue torsemide 40 qam/20 qpm. BMET/BNP today.  - I think that Cardiomems would be helpful given his multifactorial dyspnea.  We discussed this at length today.  I think he would be a reasonable candidate and he has been approved.  He is nervous about the procedure and wants to think about it, we will call him in a couple of weeks to see what he is thinking.  He would like to talk with someone who has a cardiomems, will try to arrange this.  2. H/o PE/DVT:  Suspect due to sedentary lifestyle.  Occurred in 2/21.  V/Q scan in 6/21 did not show evidence for chronic PE.   - Continue apixaban. CBC today.  3. OSA: Needs to use CPAP.  4. Anxiety/panic: He requests something to help with this.  I would avoid benzodiazepines.  - I started him on low dose sertraline, 25 mg daily.  5. CAD: Coronary CTA reviewed, suspected soft plaque distal RCA and distal LCX that is hemodynamically significant by FFR, but coronary calcium score surprisingly is 0.  No chest pain.  He does not like how ranolazine makes him feel ("fuzzy").   - I think that he can stop ranolazine. - Continue statin, but will check lipids today => goal LDL < 70, would rather use a more potent statin ideally.   Followup in 3 months but will discuss Cardiomems again with him in a couple of weeks.   Loralie Champagne 11/08/2019

## 2019-11-17 ENCOUNTER — Telehealth (HOSPITAL_COMMUNITY): Payer: Self-pay | Admitting: *Deleted

## 2019-11-17 NOTE — Telephone Encounter (Signed)
Pt left VM stating he was weak, fatigued,dizzy, and having chest pain. Pt said he felt bad and was considering calling EMS. I called the patient back no answer/left detailed VM that pt should be evaluated in the ER and to call EMS I asked the patient to return my call. 2nd attempt to reach pt and his phone is going directly to VM.

## 2019-12-05 ENCOUNTER — Telehealth: Payer: Self-pay | Admitting: Cardiology

## 2019-12-05 NOTE — Telephone Encounter (Signed)
We are recommending the COVID-19 vaccine to all of our patients. Cardiac medications (including blood thinners) should not deter anyone from being vaccinated and there is no need to hold any of those medications prior to vaccine administration.     Currently, there is a hotline to call (active 04/21/19) to schedule vaccination appointments as no walk-ins will be accepted.   Number: [phone].    If an appointment is not available please go to [email or web address] to sign up for notification when additional vaccine appointments are available.   If you have further questions or concerns about the vaccine process, please visit [email or web address] or contact your primary care physician.    The above statement was read to the pt and he acknowledge that he understood it.

## 2019-12-22 ENCOUNTER — Telehealth: Payer: Self-pay | Admitting: Cardiology

## 2019-12-22 NOTE — Telephone Encounter (Signed)
Spoke to patient. He reports heart rate while speaking to me of 148 and feeling sweaty accompanied by left arm tingling and pain. He denies chest pain. I advised him to be seen in the emergency department, he states if his heart rate doesn't come down soon he will go, again he was reminded the reccomendation is to go now or call 911. No further questions.

## 2019-12-22 NOTE — Telephone Encounter (Signed)
Pt c/o BP issue: STAT if pt c/o blurred vision, one-sided weakness or slurred speech  1. What are your last 5 BP readings?  9/10 139/96 HR157 these 3 reading were done 15 mins apart.   135/80 HR160  136/80 HR160  113/83 HR148 9/9 141/77 HR90 9/8 140/68 HR77 9/7 110/66 HR89   2. Are you having any other symptoms (ex. Dizziness, headache, blurred vision, passed out)? Sweating bad, left finger tips are numb, feels dizzy, and has headache.   3. What is your BP issue? He did take and extra BP pill.

## 2019-12-23 ENCOUNTER — Other Ambulatory Visit: Payer: Self-pay

## 2019-12-23 ENCOUNTER — Inpatient Hospital Stay (HOSPITAL_COMMUNITY)
Admission: EM | Admit: 2019-12-23 | Discharge: 2019-12-26 | DRG: 308 | Disposition: A | Discharge status: Home / Self Care | Payer: Medicare HMO | Attending: Cardiology | Admitting: Cardiology

## 2019-12-23 DIAGNOSIS — Z20822 Contact with and (suspected) exposure to covid-19: Secondary | ICD-10-CM | POA: Diagnosis present

## 2019-12-23 DIAGNOSIS — Z79899 Other long term (current) drug therapy: Secondary | ICD-10-CM

## 2019-12-23 DIAGNOSIS — I4891 Unspecified atrial fibrillation: Secondary | ICD-10-CM

## 2019-12-23 DIAGNOSIS — Z8249 Family history of ischemic heart disease and other diseases of the circulatory system: Secondary | ICD-10-CM | POA: Diagnosis not present

## 2019-12-23 DIAGNOSIS — Z86718 Personal history of other venous thrombosis and embolism: Secondary | ICD-10-CM

## 2019-12-23 DIAGNOSIS — I5033 Acute on chronic diastolic (congestive) heart failure: Secondary | ICD-10-CM | POA: Diagnosis not present

## 2019-12-23 DIAGNOSIS — Z6839 Body mass index (BMI) 39.0-39.9, adult: Secondary | ICD-10-CM | POA: Diagnosis not present

## 2019-12-23 DIAGNOSIS — G894 Chronic pain syndrome: Secondary | ICD-10-CM | POA: Diagnosis present

## 2019-12-23 DIAGNOSIS — G4733 Obstructive sleep apnea (adult) (pediatric): Secondary | ICD-10-CM | POA: Diagnosis present

## 2019-12-23 DIAGNOSIS — G4700 Insomnia, unspecified: Secondary | ICD-10-CM | POA: Diagnosis present

## 2019-12-23 DIAGNOSIS — I110 Hypertensive heart disease with heart failure: Secondary | ICD-10-CM | POA: Diagnosis present

## 2019-12-23 DIAGNOSIS — Z86711 Personal history of pulmonary embolism: Secondary | ICD-10-CM | POA: Diagnosis not present

## 2019-12-23 DIAGNOSIS — R000 Tachycardia, unspecified: Secondary | ICD-10-CM | POA: Diagnosis not present

## 2019-12-23 DIAGNOSIS — D638 Anemia in other chronic diseases classified elsewhere: Secondary | ICD-10-CM | POA: Diagnosis present

## 2019-12-23 DIAGNOSIS — I5043 Acute on chronic combined systolic (congestive) and diastolic (congestive) heart failure: Secondary | ICD-10-CM | POA: Diagnosis present

## 2019-12-23 DIAGNOSIS — Z7901 Long term (current) use of anticoagulants: Secondary | ICD-10-CM | POA: Diagnosis not present

## 2019-12-23 DIAGNOSIS — I2510 Atherosclerotic heart disease of native coronary artery without angina pectoris: Secondary | ICD-10-CM | POA: Diagnosis present

## 2019-12-23 DIAGNOSIS — Z880 Allergy status to penicillin: Secondary | ICD-10-CM

## 2019-12-23 DIAGNOSIS — R0989 Other specified symptoms and signs involving the circulatory and respiratory systems: Secondary | ICD-10-CM | POA: Diagnosis not present

## 2019-12-23 DIAGNOSIS — I10 Essential (primary) hypertension: Secondary | ICD-10-CM | POA: Diagnosis not present

## 2019-12-23 DIAGNOSIS — K7460 Unspecified cirrhosis of liver: Secondary | ICD-10-CM | POA: Diagnosis present

## 2019-12-23 DIAGNOSIS — I509 Heart failure, unspecified: Secondary | ICD-10-CM

## 2019-12-23 DIAGNOSIS — R509 Fever, unspecified: Secondary | ICD-10-CM | POA: Diagnosis not present

## 2019-12-23 DIAGNOSIS — I5032 Chronic diastolic (congestive) heart failure: Secondary | ICD-10-CM | POA: Diagnosis present

## 2019-12-23 DIAGNOSIS — I480 Paroxysmal atrial fibrillation: Secondary | ICD-10-CM | POA: Diagnosis present

## 2019-12-23 DIAGNOSIS — R002 Palpitations: Secondary | ICD-10-CM

## 2019-12-23 DIAGNOSIS — E782 Mixed hyperlipidemia: Secondary | ICD-10-CM | POA: Diagnosis present

## 2019-12-23 DIAGNOSIS — E7800 Pure hypercholesterolemia, unspecified: Secondary | ICD-10-CM | POA: Diagnosis not present

## 2019-12-23 DIAGNOSIS — D649 Anemia, unspecified: Secondary | ICD-10-CM

## 2019-12-23 HISTORY — DX: Unspecified atrial fibrillation: I48.91

## 2019-12-23 LAB — CBC
HCT: 43.5 % (ref 39.0–52.0)
HCT: 43.2 % (ref 39.0–52.0)
Hemoglobin: 13 g/dL (ref 13.0–17.0)
Hemoglobin: 13 g/dL (ref 13.0–17.0)
MCH: 24.2 pg — ABNORMAL LOW (ref 26.0–34.0)
MCH: 24.6 pg — ABNORMAL LOW (ref 26.0–34.0)
MCHC: 29.9 g/dL — ABNORMAL LOW (ref 30.0–36.0)
MCHC: 30.1 g/dL (ref 30.0–36.0)
MCV: 81 fL (ref 80.0–100.0)
MCV: 81.7 fL (ref 80.0–100.0)
Platelets: 186 10*3/uL (ref 150–400)
Platelets: 188 10*3/uL (ref 150–400)
RBC: 5.37 MIL/uL (ref 4.22–5.81)
RBC: 5.29 MIL/uL (ref 4.22–5.81)
RDW: 16.7 % — ABNORMAL HIGH (ref 11.5–15.5)
RDW: 16.9 % — ABNORMAL HIGH (ref 11.5–15.5)
WBC: 12.4 10*3/uL — ABNORMAL HIGH (ref 4.0–10.5)
WBC: 11.8 10*3/uL — ABNORMAL HIGH (ref 4.0–10.5)
nRBC: 0 % (ref 0.0–0.2)
nRBC: 0 % (ref 0.0–0.2)

## 2019-12-23 LAB — BASIC METABOLIC PANEL
Anion gap: 12 (ref 5–15)
Anion gap: 13 (ref 5–15)
BUN: 13 mg/dL (ref 8–23)
BUN: 12 mg/dL (ref 8–23)
CO2: 26 mmol/L (ref 22–32)
CO2: 28 mmol/L (ref 22–32)
Calcium: 9.3 mg/dL (ref 8.9–10.3)
Calcium: 9.3 mg/dL (ref 8.9–10.3)
Chloride: 98 mmol/L (ref 98–111)
Chloride: 95 mmol/L — ABNORMAL LOW (ref 98–111)
Creatinine, Ser: 1.05 mg/dL (ref 0.61–1.24)
Creatinine, Ser: 1.08 mg/dL (ref 0.61–1.24)
GFR calc Af Amer: >60 mL/min (ref >60)
GFR calc Af Amer: >60 mL/min (ref >60)
GFR calc non Af Amer: >60 mL/min (ref >60)
GFR calc non Af Amer: >60 mL/min (ref >60)
Glucose, Bld: 109 mg/dL — ABNORMAL HIGH (ref 70–99)
Glucose, Bld: 128 mg/dL — ABNORMAL HIGH (ref 70–99)
Potassium: 5.8 mmol/L — ABNORMAL HIGH (ref 3.5–5.1)
Potassium: 3.7 mmol/L (ref 3.5–5.1)
Sodium: 136 mmol/L (ref 135–145)
Sodium: 136 mmol/L (ref 135–145)

## 2019-12-23 LAB — HEPATIC FUNCTION PANEL
ALT: 29 U/L (ref 0–44)
AST: 36 U/L (ref 15–41)
Albumin: 3.7 g/dL (ref 3.5–5.0)
Alkaline Phosphatase: 74 U/L (ref 38–126)
Bilirubin, Direct: 0.1 mg/dL (ref 0.0–0.2)
Indirect Bilirubin: 0.7 mg/dL (ref 0.3–0.9)
Total Bilirubin: 0.8 mg/dL (ref 0.3–1.2)
Total Protein: 7.4 g/dL (ref 6.5–8.1)

## 2019-12-23 LAB — MAGNESIUM: Magnesium: 2.1 mg/dL (ref 1.7–2.4)

## 2019-12-23 LAB — SARS CORONAVIRUS 2 BY RT PCR (HOSPITAL ORDER, PERFORMED IN ~~LOC~~ HOSPITAL LAB): SARS Coronavirus 2: NEGATIVE

## 2019-12-23 LAB — BRAIN NATRIURETIC PEPTIDE: B Natriuretic Peptide: 295.7 pg/mL — ABNORMAL HIGH (ref 0.0–100.0)

## 2019-12-23 LAB — TSH: TSH: 2.472 u[IU]/mL (ref 0.350–4.500)

## 2019-12-23 MED ORDER — GABAPENTIN 400 MG PO CAPS
800.0000 mg | ORAL_CAPSULE | Freq: Three times a day (TID) | ORAL | Status: DC
  Administered 2019-12-23 – 2019-12-26 (×8): 800 mg via ORAL
  Filled 2019-12-23 (×9): qty 2

## 2019-12-23 MED ORDER — DILTIAZEM LOAD VIA INFUSION
10.0000 mg | Freq: Once | INTRAVENOUS | Status: DC
  Filled 2019-12-23: qty 10

## 2019-12-23 MED ORDER — METOPROLOL TARTRATE 5 MG/5ML IV SOLN
2.5000 mg | Freq: Once | INTRAVENOUS | Status: AC
  Administered 2019-12-23: 2.5 mg via INTRAVENOUS
  Filled 2019-12-23: qty 5

## 2019-12-23 MED ORDER — PRAVASTATIN SODIUM 10 MG PO TABS
20.0000 mg | ORAL_TABLET | Freq: Every day | ORAL | Status: DC
  Administered 2019-12-23 – 2019-12-25 (×3): 20 mg via ORAL
  Filled 2019-12-23 (×3): qty 2

## 2019-12-23 MED ORDER — APIXABAN 5 MG PO TABS
5.0000 mg | ORAL_TABLET | Freq: Two times a day (BID) | ORAL | Status: DC
  Administered 2019-12-23 – 2019-12-26 (×7): 5 mg via ORAL
  Filled 2019-12-23 (×7): qty 1

## 2019-12-23 MED ORDER — ONDANSETRON HCL 4 MG/2ML IJ SOLN
4.0000 mg | Freq: Four times a day (QID) | INTRAMUSCULAR | Status: DC | PRN
  Administered 2019-12-23: 4 mg via INTRAVENOUS
  Filled 2019-12-23: qty 2

## 2019-12-23 MED ORDER — PANTOPRAZOLE SODIUM 40 MG PO TBEC
40.0000 mg | DELAYED_RELEASE_TABLET | Freq: Two times a day (BID) | ORAL | Status: DC
  Administered 2019-12-23 – 2019-12-26 (×7): 40 mg via ORAL
  Filled 2019-12-23 (×7): qty 1

## 2019-12-23 MED ORDER — SERTRALINE HCL 25 MG PO TABS: 25.0000 mg | ORAL_TABLET | Freq: Every day | ORAL | Status: DC

## 2019-12-23 MED ORDER — DILTIAZEM HCL-DEXTROSE 125-5 MG/125ML-% IV SOLN (PREMIX)
5.0000 mg/h | INTRAVENOUS | Status: DC
  Filled 2019-12-23: qty 125

## 2019-12-23 MED ORDER — TORSEMIDE 20 MG PO TABS
40.0000 mg | ORAL_TABLET | Freq: Every day | ORAL | Status: DC
  Administered 2019-12-23 – 2019-12-24 (×2): 40 mg via ORAL
  Filled 2019-12-23 (×3): qty 2

## 2019-12-23 MED ORDER — ACETAMINOPHEN 325 MG PO TABS
650.0000 mg | ORAL_TABLET | ORAL | Status: DC | PRN
  Administered 2019-12-24 – 2019-12-25 (×2): 650 mg via ORAL
  Filled 2019-12-23 (×2): qty 2

## 2019-12-23 MED ORDER — DILTIAZEM HCL-DEXTROSE 125-5 MG/125ML-% IV SOLN (PREMIX)
5.0000 mg/h | INTRAVENOUS | Status: DC
  Administered 2019-12-23: 5 mg/h via INTRAVENOUS
  Administered 2019-12-23 – 2019-12-24 (×2): 15 mg/h via INTRAVENOUS
  Administered 2019-12-24: 10 mg/h via INTRAVENOUS
  Filled 2019-12-23 (×7): qty 125

## 2019-12-23 MED ORDER — DILTIAZEM LOAD VIA INFUSION
5.0000 mg | Freq: Once | INTRAVENOUS | Status: AC
  Administered 2019-12-23: 5 mg via INTRAVENOUS
  Filled 2019-12-23: qty 5

## 2019-12-23 MED ORDER — ZOLPIDEM TARTRATE 5 MG PO TABS
10.0000 mg | ORAL_TABLET | Freq: Every evening | ORAL | Status: DC | PRN
  Administered 2019-12-23 – 2019-12-25 (×3): 10 mg via ORAL
  Filled 2019-12-23 (×3): qty 2

## 2019-12-23 NOTE — ED Provider Notes (Signed)
[facility] EMERGENCY DEPARTMENT Provider Note   CSN: [id number] Arrival date & time: 12/23/19  0145   History Chief Complaint  Patient presents with  . Tachycardia    Charles Gray is a 64 y.o. male.  The history is provided by the patient.  He has history of hypertension, hyperlipidemia, diastolic heart failure, liver cirrhosis, pulmonary embolism anticoagulated on apixaban and comes in because of rapid pulse.  He states his blood pressure machine showed his heart rate was as high as 160 today.  It was normal yesterday.  He has noticed palpitations today.  He has chronic dyspnea which is slightly worse today.  There is some mild nausea but no vomiting.  There was some mild diaphoresis.  He denies chest pain, heaviness, tightness, pressure.  Past Medical History:  Diagnosis Date  . Acute on chronic congestive heart failure ([place])   . Acute on chronic diastolic CHF (congestive heart failure) ([place]) 08/23/2019  . Acute on chronic right-sided heart failure ([place]) 08/06/2019  . Acute respiratory failure with hypoxia ([place])   . Anemia of chronic disease   . Atrial tachycardia ([place]) 08/12/2019  . Benign essential HTN   . Chest pain 08/23/2019  . Chronic heart failure with preserved EF 05/30/2019   Echo 07/2019: EF 55-60, GR 1 DD  . Chronic pain syndrome   . Cirrhosis of liver without ascites ([place])   . Class 2 severe obesity due to excess calories with serious comorbidity and body mass index (BMI) of 39.0 to 39.9 in adult ([place]) 05/30/2019  . Debility 03/13/2019  . Drug induced constipation   . Dyspnea   . Fatty liver disease, nonalcoholic   . GIB (gastrointestinal bleeding) 03/04/2019  . Heart failure, systolic, acute ([place]) 07/20/1446  . History of pulmonary embolism 08/12/2019  . Hyperlipidemia 01/24/2019  . Hypertension 01/24/2019  . Hypoalbuminemia due to protein-calorie malnutrition ([place])   . Hypokalemia   . Hyponatremia   . Hypotension due to drugs   . Insomnia  01/24/2019  . Lactic acidosis 08/23/2019  . Lumbar disc herniation 01/24/2019  . Mixed hyperlipidemia 01/24/2019  . Morbid obesity with BMI of 40.0-44.9, adult ([place]) 05/30/2019  . Normocytic anemia 06/08/2019  . Occasional tremors 08/12/2019  . OSA (obstructive sleep apnea) 08/12/2019  . Peripheral edema   . Portal hypertensive gastropathy ([place]) 03/06/2019   Seen on EGD 03/06/2019  . Pulmonary emboli ([place]) 06/08/2019  . Pulmonary embolism ([place]) 06/08/2019  . Redness and swelling of lower leg 08/23/2019  . Shock ([place])   . Thrombocytopenia [facility])     Patient Active Problem List   Diagnosis Date Noted  . Heart failure, systolic, acute ([place]) 18/56/3149  . Acute on chronic congestive heart failure ([place])   . Acute on chronic diastolic CHF (congestive heart failure) ([place]) 08/23/2019  . Redness and swelling of lower leg 08/23/2019  . Chest pain 08/23/2019  . Lactic acidosis 08/23/2019  . History of pulmonary embolism 08/12/2019  . Atrial tachycardia ([place]) 08/12/2019  . Occasional tremors 08/12/2019  . OSA (obstructive sleep apnea) 08/12/2019  . Acute on chronic right-sided heart failure ([place]) 08/06/2019  . Pulmonary embolism ([place]) 06/08/2019  . Normocytic anemia 06/08/2019  . Pulmonary emboli ([place]) 06/08/2019  . Chronic heart failure with preserved ejection fraction ([place]) 05/30/2019  . Class 2 severe obesity due to excess calories with serious comorbidity and body mass index (BMI) of 39.0 to 39.9 in adult ([place]) 05/30/2019  . Hyponatremia   . Benign essential HTN   . Drug  induced constipation   . Peripheral edema   . Hypotension due to drugs   . Hypoalbuminemia due to protein-calorie malnutrition ([place])   . Hypokalemia   . Thrombocytopenia ([place])   . Anemia of chronic disease   . Cirrhosis of liver without ascites ([place])   . Chronic pain syndrome   . Debility 03/13/2019  . Dyspnea   . Mixed hyperlipidemia 01/24/2019  . Insomnia 01/24/2019    Past Surgical History:  Procedure Laterality  Date  . ESOPHAGOGASTRODUODENOSCOPY (EGD) WITH PROPOFOL N/A 03/05/2019   Procedure: ESOPHAGOGASTRODUODENOSCOPY (EGD) WITH PROPOFOL;  Surgeon: Doran Stabler, MD;  Location: [facility];  Service: Gastroenterology;  Laterality: N/A;  Large Blood Clot Removed  . ESOPHAGOGASTRODUODENOSCOPY (EGD) WITH PROPOFOL N/A 03/06/2019   Procedure: ESOPHAGOGASTRODUODENOSCOPY (EGD) WITH PROPOFOL;  Surgeon: Thornton Park, MD;  Location: [facility];  Service: Gastroenterology;  Laterality: N/A;  . HEMORROIDECTOMY    . RIGHT HEART CATH N/A 08/09/2019   Procedure: RIGHT HEART CATH;  Surgeon: Larey Dresser, MD;  Location: [facility] CV LAB;  Service: Cardiovascular;  Laterality: N/A;       Family History  Problem Relation Age of Onset  . Hyperlipidemia Mother   . Hypertension Mother   . Stroke Mother   . CAD Maternal Grandmother   . CAD Maternal Grandfather     Social History   Tobacco Use  . Smoking status: Never Smoker  . Smokeless tobacco: Never Used  Vaping Use  . Vaping Use: Never used  Substance Use Topics  . Alcohol use: Never  . Drug use: Not Currently    Types: Marijuana    Comment: Smoked for 30 years    Home Medications Prior to Admission medications   Medication Sig Start Date End Date Taking? Authorizing Provider  acetaminophen (TYLENOL) 325 MG tablet Take 2 tablets (650 mg total) by mouth every 4 (four) hours as needed for fever or mild pain. 03/23/19   Angiulli, Lavon Paganini, PA-C  apixaban (ELIQUIS) 5 MG TABS tablet Take 1 tablet (5 mg total) by mouth 2 (two) times daily. 08/12/19   Strader, Fransisco Hertz, PA-C  Buprenorphine HCl-Naloxone HCl 8-2 MG FILM Place 1 tablet under the tongue in the morning and at bedtime.     [provider]  diphenhydrAMINE (BENADRYL) 25 MG tablet Take 25 mg by mouth every 6 (six) hours as needed.    [provider]  gabapentin (NEURONTIN) 800 MG tablet Take 1 tablet (800 mg total) by mouth 3 (three) times daily. 03/23/19    Angiulli, Lavon Paganini, PA-C  lisinopril (ZESTRIL) 5 MG tablet Take 1 tablet (5 mg total) by mouth daily. 08/12/19 08/11/20  Richardson Dopp T, PA-C  metoprolol tartrate (LOPRESSOR) 100 MG tablet Take 1 tablet (100 mg total) by mouth once for 1 dose. Two hours before ct 09/18/19 11/07/19  Tobb, Kardie, DO  pantoprazole (PROTONIX) 40 MG tablet Take 40 mg by mouth 2 (two) times daily.    [provider]  polyethylene glycol (MIRALAX / GLYCOLAX) 17 g packet Take 17 g by mouth daily. Patient taking differently: Take 17 g by mouth daily as needed for mild constipation.  03/23/19   Angiulli, Lavon Paganini, PA-C  pravastatin (PRAVACHOL) 20 MG tablet Take 1 tablet (20 mg total) by mouth daily. 03/23/19   Angiulli, Lavon Paganini, PA-C  ranolazine (RANEXA) 500 MG 12 hr tablet Take 1 tablet (500 mg total) by mouth 2 (two) times daily. 10/18/19   Tobb, Kardie, DO  sertraline (ZOLOFT) 25 MG  tablet Take 1 tablet (25 mg total) by mouth daily. 11/07/19   Larey Dresser, MD  spironolactone (ALDACTONE) 25 MG tablet Take 1 tablet (25 mg total) by mouth daily. 09/21/19   Tobb, Kardie, DO  torsemide (DEMADEX) 20 MG tablet Take 2 tablets (40 mg total) by mouth every morning AND 1 tablet (20 mg total) every evening. 09/21/19   Tobb, Kardie, DO  VENTOLIN HFA 108 (90 Base) MCG/ACT inhaler Inhale 1 puff into the lungs every 4 (four) hours as needed for shortness of breath. 03/23/19   Angiulli, Lavon Paganini, PA-C  zolpidem (AMBIEN) 10 MG tablet Take 10 mg by mouth at bedtime as needed for sleep.  05/19/19   [provider]    Allergies    Penicillins  Review of Systems   Review of Systems  All other systems reviewed and are negative.   Physical Exam Updated Vital Signs SpO2 100%   Physical Exam Vitals and nursing note reviewed.   Morbidly obese 64 year old male, resting comfortably and in no acute distress. Vital signs are significant for rapid heart rate. Oxygen saturation is 100%, which is normal. Head is normocephalic and  atraumatic. PERRLA, EOMI. Oropharynx is clear. Neck is nontender and supple without adenopathy or JVD. Back is nontender and there is no CVA tenderness. Lungs are clear without rales, wheezes, or rhonchi. Chest is nontender. Heart has regular rate and rhythm without murmur. Abdomen is soft, flat, nontender without masses or hepatosplenomegaly and peristalsis is normoactive. Extremities have trace edema with moderate venous stasis changes bilaterally, full range of motion is present. Skin is warm and dry without rash. Neurologic: Mental status is normal, cranial nerves are intact, there are no motor or sensory deficits.  ED Results / Procedures / Treatments   Labs (all labs ordered are listed, but only abnormal results are displayed) Labs Reviewed  BASIC METABOLIC PANEL - Abnormal; Notable for the following components:      Result Value   Potassium 5.8 (*)    Glucose, Bld 109 (*)    All other components within normal limits  CBC - Abnormal; Notable for the following components:   WBC 12.4 (*)    MCH 24.2 (*)    MCHC 29.9 (*)    RDW 16.7 (*)    All other components within normal limits  SARS CORONAVIRUS 2 BY RT PCR (HOSPITAL ORDER, [facility])  MAGNESIUM  BASIC METABOLIC PANEL  CBC  HEPATIC FUNCTION PANEL  TSH  BRAIN NATRIURETIC PEPTIDE    EKG EKG Interpretation  Date/Time:  Saturday December 23 2019 01:40:03 EDT Ventricular Rate:  143 PR Interval:    QRS Duration: 98 QT Interval:  327 QTC Calculation: 505 R Axis:   -129 Text Interpretation: Atrial fibrillation with rapid ventricular response Markedly posterior QRS axis Low voltage, precordial leads Probable anteroseptal infarct, old Prolonged QT interval When compared with ECG of 11/07/2019, Atrial fibrillation with rapid ventricular response has replaced Sinus rhythm QT has lengthened Confirmed by Delora Fuel ([id number]) on 12/23/2019 1:45:16 AM   Procedures Procedures  CRITICAL CARE Performed  by: Delora Fuel Total critical care time: 55 minutes Critical care time was exclusive of separately billable procedures and treating other patients. Critical care was necessary to treat or prevent imminent or life-threatening deterioration. Critical care was time spent personally by me on the following activities: development of treatment plan with patient and/or surrogate as well as nursing, discussions with consultants, evaluation of patient's response to treatment, examination of  patient, obtaining history from patient or surrogate, ordering and performing treatments and interventions, ordering and review of laboratory studies, ordering and review of radiographic studies, pulse oximetry and re-evaluation of patient's condition.  Medications Ordered in ED Medications  diltiazem (CARDIZEM) 1 mg/mL load via infusion 10 mg (has no administration in time range)    And  diltiazem (CARDIZEM) 125 mg in dextrose 5% 125 mL (1 mg/mL) infusion (has no administration in time range)  metoprolol tartrate (LOPRESSOR) injection 2.5 mg (2.5 mg Intravenous Given 12/23/19 0218)    ED Course  I have reviewed the triage vital signs and the nursing notes.  Pertinent labs & imaging results that were available during my care of the patient were reviewed by me and considered in my medical decision making (see chart for details).  MDM Rules/Calculators/A&P Atrial fibrillation with rapid ventricular response.  Patient was advised that electrical cardioversion was the recommended treatment.  He states he does not want to get electric shocks and wants to know about other treatment options.  Unfortunately, he has several contraindications to flecainide.  Apparently, he had been given diltiazem by EMS and heart rate did drop to 107.  He is borderline hypotensive here, so we will proceed with beta-blockade to try to control his heart rate.  He is given an injection of metoprolol.  Will discuss with cardiology.  Heart rate is  come down modestly with metoprolol.  Labs do show mild hyperkalemia, but on a hemolyzed specimen and this is not felt to be accurate.  Cardiology consultation by Dr. Marletta Lor appreciated.  Patient still will not allow cardioversion.  He will be admitted for rate control.  CHA2DS2-VASc score = 4 (one point each for history of CHF, hypertension, two points for thromboemboism history).  Final Clinical Impression(s) / ED Diagnoses Final diagnoses:  Atrial fibrillation with rapid ventricular response ([place])  Normocytic anemia    Rx / DC Orders ED Discharge Orders    None       Delora Fuel, MD 16/10/96 0422

## 2019-12-23 NOTE — ED Notes (Signed)
Lab stated pt purple top clotted. Lab at bedside to re draw.

## 2019-12-23 NOTE — H&P (Signed)
Cardiology Admission History and Physical:   Patient ID: Charles Gray MRN: [id number]; DOB: 01-23-1956   Admission date: 12/23/2019  Primary Care Provider: Enid Skeens., MD [facility] Cardiologist: Berniece Salines, DO  [facility] Electrophysiologist:  None   Chief Complaint:  palpitations  Patient Profile:   Charles Gray is a 64 y.o. male with COPD, cirrhosis, DVT/PE on eliquis, and chronic diastolic HF who presents with palpitations found to be newly in AF w/ RVR.  History of Present Illness:   Charles Gray was in his usual state of health with stable weights and chronic stable dypsnea until the morning prior to presentation. He awoke feeling "unwell" and checked his BP - which he noted to be normal - but his HR was elevated to the 140s. He did feel palpitations at this time. He reached out to his primary cardiologist who advised ED admission, but he wanted at home for most of they day out of fear of coming to the hospital where he may encounter COVID patients (he is unvaccinated). Early this AM he presented with the following complaints and was noted to be normotensive (110s/80s) but in AF with RVR as high as 160s. He was given 28m IV diltiazem en route with good response but remained in AF. In the ER, EKG with af and RVR.  Labs without significant perturbations in renal function and no leukocytosis.   Cardioversion was discussed with the patient, but he preferred a trial of rate control strategy. He was started on low dose dilitazem drip and cardiology was consulted for admission.   On ROS no N/V/D, denies fevers, chills, cough. No sick contacts. Weight has been stable without worsening LE edema.   Past Medical History:  Diagnosis Date  . Acute on chronic congestive heart failure (HS[place]   . Acute on chronic diastolic CHF (congestive heart failure) ([place] 08/23/2019  . Acute on chronic right-sided heart failure (HV[place] 08/06/2019  . Acute respiratory failure with hypoxia  ([place]   . Anemia of chronic disease   . Atrial tachycardia ([place] 08/12/2019  . Benign essential HTN   . Chest pain 08/23/2019  . Chronic heart failure with preserved EF 05/30/2019   Echo 07/2019: EF 55-60, GR 1 DD  . Chronic pain syndrome   . Cirrhosis of liver without ascites ([place]   . Class 2 severe obesity due to excess calories with serious comorbidity and body mass index (BMI) of 39.0 to 39.9 in adult (HRidgely 05/30/2019  . Debility 03/13/2019  . Drug induced constipation   . Dyspnea   . Fatty liver disease, nonalcoholic   . GIB (gastrointestinal bleeding) 03/04/2019  . Heart failure, systolic, acute (HR[place]ena 57/20/9470 . History of pulmonary embolism 08/12/2019  . Hyperlipidemia 01/24/2019  . Hypertension 01/24/2019  . Hypoalbuminemia due to protein-calorie malnutrition (HG[place]n   . Hypokalemia   . Hyponatremia   . Hypotension due to drugs   . Insomnia 01/24/2019  . Lactic acidosis 08/23/2019  . Lumbar disc herniation 01/24/2019  . Mixed hyperlipidemia 01/24/2019  . Morbid obesity with BMI of 40.0-44.9, adult (H[place] 05/30/2019  . Normocytic anemia 06/08/2019  . Occasional tremors 08/12/2019  . OSA (obstructive sleep apnea) 08/12/2019  . Peripheral edema   . Portal hypertensive gastropathy (H[place] 03/06/2019   Seen on EGD 03/06/2019  . Pulmonary emboli (HBeth[place] 06/08/2019  . Pulmonary embolism (HHatch 06/08/2019  . Redness and swelling of lower leg 08/23/2019  . Shock (HSadie[place]   . Thrombocytopenia (H[place]     Past Surgical History:  Procedure Laterality Date  . ESOPHAGOGASTRODUODENOSCOPY (EGD) WITH PROPOFOL N/A 03/05/2019   Procedure: ESOPHAGOGASTRODUODENOSCOPY (EGD) WITH PROPOFOL;  Surgeon: Doran Stabler, MD;  Location: [place];  Service: Gastroenterology;  Laterality: N/A;  Large Blood Clot Removed  . ESOPHAGOGASTRODUODENOSCOPY (EGD) WITH PROPOFOL N/A 03/06/2019   Procedure: ESOPHAGOGASTRODUODENOSCOPY (EGD) WITH PROPOFOL;  Surgeon: Thornton Park, MD;  Location: [place];  Service:  Gastroenterology;  Laterality: N/A;  . HEMORROIDECTOMY    . RIGHT HEART CATH N/A 08/09/2019   Procedure: RIGHT HEART CATH;  Surgeon: Larey Dresser, MD;  Location: [place] CV LAB;  Service: Cardiovascular;  Laterality: N/A;     Medications Prior to Admission: Prior to Admission medications   Medication Sig Start Date End Date Taking? Authorizing Provider  acetaminophen (TYLENOL) 325 MG tablet Take 2 tablets (650 mg total) by mouth every 4 (four) hours as needed for fever or mild pain. 03/23/19   Angiulli, Lavon Paganini, PA-C  apixaban (ELIQUIS) 5 MG TABS tablet Take 1 tablet (5 mg total) by mouth 2 (two) times daily. 08/12/19   Strader, Fransisco Hertz, PA-C  Buprenorphine HCl-Naloxone HCl 8-2 MG FILM Place 1 tablet under the tongue in the morning and at bedtime.     [provider]  diphenhydrAMINE (BENADRYL) 25 MG tablet Take 25 mg by mouth every 6 (six) hours as needed.    [provider]  gabapentin (NEURONTIN) 800 MG tablet Take 1 tablet (800 mg total) by mouth 3 (three) times daily. 03/23/19   Angiulli, Lavon Paganini, PA-C  lisinopril (ZESTRIL) 5 MG tablet Take 1 tablet (5 mg total) by mouth daily. 08/12/19 08/11/20  Richardson Dopp T, PA-C  metoprolol tartrate (LOPRESSOR) 100 MG tablet Take 1 tablet (100 mg total) by mouth once for 1 dose. Two hours before ct 09/18/19 11/07/19  Tobb, Kardie, DO  pantoprazole (PROTONIX) 40 MG tablet Take 40 mg by mouth 2 (two) times daily.    [provider]  polyethylene glycol (MIRALAX / GLYCOLAX) 17 g packet Take 17 g by mouth daily. Patient taking differently: Take 17 g by mouth daily as needed for mild constipation.  03/23/19   Angiulli, Lavon Paganini, PA-C  pravastatin (PRAVACHOL) 20 MG tablet Take 1 tablet (20 mg total) by mouth daily. 03/23/19   Angiulli, Lavon Paganini, PA-C  ranolazine (RANEXA) 500 MG 12 hr tablet Take 1 tablet (500 mg total) by mouth 2 (two) times daily. 10/18/19   Tobb, Kardie, DO  sertraline (ZOLOFT) 25 MG tablet Take 1 tablet (25 mg  total) by mouth daily. 11/07/19   Larey Dresser, MD  spironolactone (ALDACTONE) 25 MG tablet Take 1 tablet (25 mg total) by mouth daily. 09/21/19   Tobb, Kardie, DO  torsemide (DEMADEX) 20 MG tablet Take 2 tablets (40 mg total) by mouth every morning AND 1 tablet (20 mg total) every evening. 09/21/19   Tobb, Kardie, DO  VENTOLIN HFA 108 (90 Base) MCG/ACT inhaler Inhale 1 puff into the lungs every 4 (four) hours as needed for shortness of breath. 03/23/19   Angiulli, Lavon Paganini, PA-C  zolpidem (AMBIEN) 10 MG tablet Take 10 mg by mouth at bedtime as needed for sleep.  05/19/19   [provider]     Allergies:    Allergies  Allergen Reactions  . Penicillins     Did it involve swelling of the face/tongue/throat, SOB, or low BP? N/A Did it involve sudden or severe rash/hives, skin peeling, or any reaction on the inside of your mouth or nose? N/A  Did you need to seek medical attention at a hospital or doctor's office?N/A When did it last happen?N/A If all above answers are "NO", may proceed with cephalosporin use.    Social History:   Social History   Socioeconomic History  . Marital status: Unknown    Spouse name: Not on file  . Number of children: Not on file  . Years of education: Not on file  . Highest education level: Not on file  Occupational History  . Not on file  Tobacco Use  . Smoking status: Never Smoker  . Smokeless tobacco: Never Used  Vaping Use  . Vaping Use: Never used  Substance and Sexual Activity  . Alcohol use: Never  . Drug use: Not Currently    Types: Marijuana    Comment: Smoked for 30 years  . Sexual activity: Not on file  Other Topics Concern  . Not on file  Social History Narrative  . Not on file   Social Determinants of Health   Financial Resource Strain:   . Difficulty of Paying Living Expenses: Not on file  Food Insecurity:   . Worried About Charity fundraiser in the Last Year: Not on file  . Ran Out of Food in the Last Year: Not on  file  Transportation Needs:   . Lack of Transportation (Medical): Not on file  . Lack of Transportation (Non-Medical): Not on file  Physical Activity:   . Days of Exercise per Week: Not on file  . Minutes of Exercise per Session: Not on file  Stress:   . Feeling of Stress : Not on file  Social Connections:   . Frequency of Communication with Friends and Family: Not on file  . Frequency of Social Gatherings with Friends and Family: Not on file  . Attends Religious Services: Not on file  . Active Member of Clubs or Organizations: Not on file  . Attends Archivist Meetings: Not on file  . Marital Status: Not on file  Intimate Partner Violence:   . Fear of Current or Ex-Partner: Not on file  . Emotionally Abused: Not on file  . Physically Abused: Not on file  . Sexually Abused: Not on file    Family History:   The patient's family history includes CAD in his maternal grandfather and maternal grandmother; Hyperlipidemia in his mother; Hypertension in his mother; Stroke in his mother.    ROS:  Please see the history of present illness.  All other ROS reviewed and negative.     Physical Exam/Data:   Vitals:   12/23/19 0217 12/23/19 0230 12/23/19 0245 12/23/19 0300  BP: 100/72 118/89 118/84 (!) 143/88  Pulse: (!) 142 (!) 129 (!) 153 (!) 132  Resp: 14 13 (!) 22 17  SpO2:  97% 98% 98%  Weight:      Height:       No intake or output data in the 24 hours ending 12/23/19 0329 Last 3 Weights 12/23/2019 11/07/2019 10/18/2019  Weight (lbs) 375 lb 376 lb 377 lb 12.8 oz  Weight (kg) 170.099 kg 170.552 kg 171.369 kg     Body mass index is 39.21 kg/m.  General:  Pleasant, obese caucasian male in no distress.  Neck: JVD difficult to assess.  Vascular: No carotid bruits; FA pulses 2+ bilaterally without bruits  Cardiac:  distant heart sounds. normal S1, S2; RRR; no murmur  Lungs:  Quiet breath sounds due to habitus.  Abd: obese, soft, nontender, no hepatomegaly  Ext: warm.  Chronic venous stasis changes. 1+ edema.  Musculoskeletal:  No deformities, BUE and BLE strength normal and equal Skin: warm and dry  Psych:  Normal affect  EKG:  The ECG that was done afib with RVR. Bizarre axis; query incorrect lead placement. Low voltage throughout.   Relevant CV Studies: TTE 07/2019: 1. Suboptimal image quality for diagnostic purposes.  2. Left ventricular ejection fraction, by estimation, is 55 to 60%. The  left ventricle has normal function. Left ventricular endocardial border  not optimally defined to evaluate regional wall motion. Left ventricular  diastolic parameters are consistent  with Grade I diastolic dysfunction (impaired relaxation).  3. Right ventricular systolic function was not well visualized. The right  ventricular size is not well visualized.  4. The mitral valve is grossly normal. No evidence of mitral valve  regurgitation. No evidence of mitral stenosis.  5. The aortic valve was not well visualized. Aortic valve regurgitation  is not visualized. No aortic stenosis is present.   [place] 07/2019: Right Heart Pressures RHC Procedural Findings: Hemodynamics (mmHg) RA mean 8 RV 21/8 PA 20/8, mean 14 PCWP mean 11  Oxygen saturations: PA 71% AO 100%  Cardiac Output (Fick) 8.5  Cardiac Index (Fick) 2.8     Laboratory Data:  High Sensitivity Troponin:  No results for input(s): TROPONINIHS in the last 720 hours.    Chemistry Recent Labs  Lab 12/23/19 0150  NA 136  K 5.8*  CL 98  CO2 26  GLUCOSE 109*  BUN 13  CREATININE 1.05  CALCIUM 9.3  GFRNONAA >60  GFRAA >60  ANIONGAP 12    No results for input(s): PROT, ALBUMIN, AST, ALT, ALKPHOS, BILITOT in the last 168 hours. Hematology Recent Labs  Lab 12/23/19 0247  WBC 12.4*  RBC 5.37  HGB 13.0  HCT 43.5  MCV 81.0  MCH 24.2*  MCHC 29.9*  RDW 16.7*  PLT 186   BNPNo results for input(s): BNP, PROBNP in the last 168 hours.  DDimer No results for input(s): DDIMER in the last  168 hours.   Radiology/Studies:  No results found.   Assessment and Plan:   1. Atrial Fibrillation with RVR; Suspect related to HFpEF phenotype (obesity, diastolic HF, untreated OSA).  -- Patient prefers initial rhythm control strategy; will be amenable to DCCV Monday if medications are ineffective at cardioversion or rate control.  -- Continue low dose dilitazem drip; watch BP closely given mild RV dysfunction by RHC (PAPi 1.5, CVP/PCWP 0.7).  -- Anticoagulated with Eliquis -- Will check TSH -- Needs CPAP for untreated OSA - has not yet received.   2. HFpEF -- Continue torsemide 52m daily -- Hold spironolactone 25 until non-hemolyzed potassium results.  -- Check BNP  3. HTN -- Hold lisinopril for now given borderline BP.   4. CAD -- Continue home statin  Patient has not yet received COVID vaccine. After long discussion with patient he is amenable. Would try to orchestrate vaccination while in house or ensure he has resources to identify a vaccination site upon discharge.   Severity of Illness: The appropriate patient status for this patient is OBSERVATION. Observation status is judged to be reasonable and necessary in order to provide the required intensity of service to ensure the patient's safety. The patient's presenting symptoms, physical exam findings, and initial radiographic and laboratory data in the context of their medical condition is felt to place them at decreased risk for further clinical deterioration. Furthermore, it is anticipated that the patient will be medically stable for  discharge from the hospital within 2 midnights of admission. The following factors support the patient status of observation.   " The patient's presenting symptoms include afib with RVR. " The physical exam findings include LE edema " The initial radiographic and laboratory data are reassuring.   For questions or updates, please contact [place] Please consult [email or web address] for  contact info under     Signed, Milus Banister, MD  12/23/2019 3:29 AM

## 2019-12-23 NOTE — ED Notes (Signed)
Pt up to bedside commode to have a BM

## 2019-12-23 NOTE — ED Triage Notes (Addendum)
Pt arrives to [facility] ED via EMS with complaints of tachycardia. He describes feeling a "racing heart" since yesterday (9/10) evening with hx of CHF but no prior hx of atrial fibrillation. Pt was seen ambulating to ambulance and complaining of SOB when EMS arrived at his home. EMS reports pt HR rose to 190s during transportation. EMS administered 72m cardizem for tachycardia via IV. After receiving cardizem, HR decreased to 107 before arrival. EMS states that lungs sounded clear. Pt denies any pain

## 2019-12-24 DIAGNOSIS — I2510 Atherosclerotic heart disease of native coronary artery without angina pectoris: Secondary | ICD-10-CM

## 2019-12-24 DIAGNOSIS — E7800 Pure hypercholesterolemia, unspecified: Secondary | ICD-10-CM

## 2019-12-24 DIAGNOSIS — I10 Essential (primary) hypertension: Secondary | ICD-10-CM

## 2019-12-24 DIAGNOSIS — I4891 Unspecified atrial fibrillation: Secondary | ICD-10-CM

## 2019-12-24 MED ORDER — SPIRONOLACTONE 25 MG PO TABS
25.0000 mg | ORAL_TABLET | Freq: Every day | ORAL | Status: DC
  Administered 2019-12-24: 25 mg via ORAL
  Filled 2019-12-24 (×2): qty 1

## 2019-12-24 MED ORDER — BUPRENORPHINE HCL-NALOXONE HCL 8-2 MG SL SUBL
1.0000 | SUBLINGUAL_TABLET | Freq: Two times a day (BID) | SUBLINGUAL | Status: DC
  Administered 2019-12-24 – 2019-12-26 (×5): 1 via SUBLINGUAL
  Filled 2019-12-24 (×5): qty 1

## 2019-12-24 MED ORDER — RANOLAZINE ER 500 MG PO TB12
500.0000 mg | ORAL_TABLET | Freq: Two times a day (BID) | ORAL | Status: DC
  Administered 2019-12-24 – 2019-12-26 (×5): 500 mg via ORAL
  Filled 2019-12-24 (×5): qty 1

## 2019-12-24 MED ORDER — GABAPENTIN 400 MG PO CAPS
400.0000 mg | ORAL_CAPSULE | Freq: Three times a day (TID) | ORAL | Status: DC
  Administered 2019-12-24: 400 mg via ORAL
  Filled 2019-12-24: qty 1

## 2019-12-24 NOTE — Progress Notes (Signed)
   12/24/19 0759  Assess: MEWS Score  Temp 97.8 F (36.6 C)  BP 108/77  Pulse Rate (!) 115  ECG Heart Rate (!) 112  Resp 20  Level of Consciousness Alert  SpO2 97 %  O2 Device Room Air  Assess: MEWS Score  MEWS Temp 0  MEWS Systolic 0  MEWS Pulse 2  MEWS RR 0  MEWS LOC 0  MEWS Score 2  MEWS Score Color Yellow  Assess: if the MEWS score is Yellow or Red  Were vital signs taken at a resting state? Yes  Focused Assessment No change from prior assessment  Early Detection of Sepsis Score *See Row Information* Low  MEWS guidelines implemented *See Row Information* Yes  Treat  Pain Scale 0-10  Pain Score 7  Pain Type Chronic pain  Pain Location Back  Pain Orientation Mid;Lower  Pain Descriptors / Indicators Aching  Pain Frequency Intermittent  Pain Onset On-going  Patients Stated Pain Goal 5  Pain Intervention(s) RN made aware;Repositioned  Take Vital Signs  Increase Vital Sign Frequency  Yellow: Q 2hr X 2 then Q 4hr X 2, if remains yellow, continue Q 4hrs  Escalate  MEWS: Escalate Yellow: discuss with charge nurse/RN and consider discussing with provider and RRT  Notify: Charge Nurse/RN  Name of Charge Nurse/RN Notified Christy   Date Charge Nurse/RN Notified 12/24/19  Time Charge Nurse/RN Notified 1109  [place] initiated

## 2019-12-24 NOTE — Progress Notes (Addendum)
Progress Note  Patient Name: Charles Gray Date of Encounter: 12/24/2019  Primary Cardiologist: Berniece Salines, DO   Subjective   Denies any chest pain or SOB.  Tele shows persistence of afib with RVR in the low 100's  Inpatient Medications    Scheduled Meds: . apixaban  5 mg Oral BID  . gabapentin  800 mg Oral TID  . pantoprazole  40 mg Oral BID  . pravastatin  20 mg Oral QHS  . torsemide  40 mg Oral Daily   Continuous Infusions: . diltiazem (CARDIZEM) infusion 15 mg/hr (12/24/19 0758)   PRN Meds: acetaminophen, ondansetron (ZOFRAN) IV, zolpidem   Vital Signs    Vitals:   12/23/19 2145 12/24/19 0048 12/24/19 0437 12/24/19 0623  BP: 126/84 108/85 (!) 133/95 104/71  Pulse: 81 87 (!) 130 85  Resp: 20 20 20    Temp: 98.3 F (36.8 C) 97.8 F (36.6 C) (!) 97.4 F (36.3 C)   TempSrc:  Oral Oral   SpO2: 96% 96% 100% 98%  Weight:      Height:        Intake/Output Summary (Last 24 hours) at 12/24/2019 0806 Last data filed at 12/24/2019 0700 Gross per 24 hour  Intake 732.16 ml  Output 650 ml  Net 82.16 ml   Filed Weights   12/23/19 0206 12/23/19 1820  Weight: (!) 170.1 kg (!) 168.8 kg    Telemetry    Atrial fibrillation with RVR in the low 100's - Personally Reviewed  ECG    No new EKG to review - Personally Reviewed  Physical Exam   GEN: No acute distress.   Neck: No JVD Cardiac: irregularly irregular and tachy, no murmurs, rubs, or gallops.  Respiratory: Clear to auscultation bilaterally. GI: Soft, nontender, non-distended  MS: No edema; No deformity. Neuro:  Nonfocal  Psych: Normal affect   Labs    Chemistry Recent Labs  Lab 12/23/19 0150 12/23/19 0637  NA 136 136  K 5.8* 3.7  CL 98 95*  CO2 26 28  GLUCOSE 109* 128*  BUN 13 12  CREATININE 1.05 1.08  CALCIUM 9.3 9.3  PROT  --  7.4  ALBUMIN  --  3.7  AST  --  36  ALT  --  29  ALKPHOS  --  74  BILITOT  --  0.8  GFRNONAA >60 >60  GFRAA >60 >60  ANIONGAP 12 13      Hematology Recent Labs  Lab 12/23/19 0247 12/23/19 0637  WBC 12.4* 11.8*  RBC 5.37 5.29  HGB 13.0 13.0  HCT 43.5 43.2  MCV 81.0 81.7  MCH 24.2* 24.6*  MCHC 29.9* 30.1  RDW 16.7* 16.9*  PLT 186 188    Cardiac EnzymesNo results for input(s): TROPONINI in the last 168 hours. No results for input(s): TROPIPOC in the last 168 hours.   BNP Recent Labs  Lab 12/23/19 0638  BNP 295.7*     DDimer No results for input(s): DDIMER in the last 168 hours.   Radiology    No results found.  Cardiac Studies   2D echo 07/2019 IMPRESSIONS    1. Suboptimal image quality for diagnostic purposes.  2. Left ventricular ejection fraction, by estimation, is 55 to 60%. The  left ventricle has normal function. Left ventricular endocardial border  not optimally defined to evaluate regional wall motion. Left ventricular  diastolic parameters are consistent  with Grade I diastolic dysfunction (impaired relaxation).  3. Right ventricular systolic function was not well visualized. The right  ventricular size is not well visualized.  4. The mitral valve is grossly normal. No evidence of mitral valve  regurgitation. No evidence of mitral stenosis.  5. The aortic valve was not well visualized. Aortic valve regurgitation  is not visualized. No aortic stenosis is present.   Patient Profile     64 y.o. male with COPD, cirrhosis, DVT/PE on eliquis, mild non-calcified plaque in RCA and LAD on coronary CTA 3/[id number] chronic diastolic HF who presents with palpitations found to be newly in AF w/ RVR.  Assessment & Plan    1. Atrial Fibrillation with RVR - Suspect related to HFpEF phenotype (obesity, diastolic HF, untreated OSA).  -remains in afib with RVR despite IV Cardizem gtt   -Patient prefers initial rhythm control strategy   -he has not missed any doses of Eliquis in the past 4 weeks (on eliquis for hx of DVT/PE) so will make NPO after MN for DCCV tomorrow   -TSH is normal  - Continue  low dose dilitazem drip  - Continue Eliquis 30m BID  - Needs CPAP for untreated OSA - has not yet received.   - Risks and benefits of cardioversion discussed including risk of MI/CVA/death/aspiration/risks of anesthesia and need for temporary pacing post cardioversion and patient agrees to proceed.  2. Chronic HFpEF --EF 55-60% with G1DD on echo 07/2019 --BNP minimally elevated at 295 --Cxray with no CHF --Appears euvolemic on exam today --Continue torsemide 474mdaily --restart Spiro  3. HTN --BP still soft at times --Hold lisinopril for now given borderline BP.   4. CAD --mild soft plaque 25-49% in mid to distal RCA and mid LAD on coronary CTA 10/2019 --denies any angina --Continue home statin --no ASA due to DOAC  5.  OSA -mild by home sleep study but has not gotten his device yet and HST was several months ago -I will check into getting him a PAP device ASAP  I have spent a total of 35 minutes with patient reviewing coronary CTA, 2D echo , telemetry, EKGs, labs and examining patient as well as establishing an assessment and plan that was discussed with the patient.  > 50% of time was spent in direct patient care.    For questions or updates, please contact [facility] [place] consult [email or web address] for contact info under Cardiology/STEMI.      Signed, TrFransico HimMD  12/24/2019, 8:06 AM

## 2019-12-25 DIAGNOSIS — I480 Paroxysmal atrial fibrillation: Principal | ICD-10-CM

## 2019-12-25 MED ORDER — LORAZEPAM 0.5 MG PO TABS
0.5000 mg | ORAL_TABLET | ORAL | Status: DC | PRN
  Administered 2019-12-25: 0.5 mg via ORAL
  Filled 2019-12-25: qty 1

## 2019-12-25 NOTE — Progress Notes (Addendum)
Progress Note  Patient Name: Charles Gray Date of Encounter: 12/25/2019  [facility] Cardiologist: Berniece Salines, DO   Subjective   Converted to sinus overnight. Patient very nervous and anxious during interview but overall feeling okay. Says he feels some dizziness and lightheadedness when he stands up. Will check orthostatics. No chest pain. Breathing good.   Inpatient Medications    Scheduled Meds: . apixaban  5 mg Oral BID  . buprenorphine-naloxone  1 tablet Sublingual BID  . gabapentin  800 mg Oral TID  . pantoprazole  40 mg Oral BID  . pravastatin  20 mg Oral QHS  . ranolazine  500 mg Oral BID  . spironolactone  25 mg Oral Daily  . torsemide  40 mg Oral Daily   Continuous Infusions: . diltiazem (CARDIZEM) infusion 5 mg/hr (12/24/19 2228)   PRN Meds: acetaminophen, ondansetron (ZOFRAN) IV, zolpidem   Vital Signs    Vitals:   12/24/19 1542 12/24/19 2041 12/24/19 2314 12/25/19 0429  BP: 135/78 96/63 108/73 105/81  Pulse: (!) 111 77 (!) 108 91  Resp: 19 20    Temp: 97.7 F (36.5 C) 98.1 F (36.7 C) 97.9 F (36.6 C) 98.1 F (36.7 C)  TempSrc: Oral Oral Oral Oral  SpO2: 98% 100% 98% 96%  Weight:    (!) 170.4 kg  Height:        Intake/Output Summary (Last 24 hours) at 12/25/2019 0733 Last data filed at 12/25/2019 0700 Gross per 24 hour  Intake 1022.98 ml  Output 2900 ml  Net -1877.02 ml   Last 3 Weights 12/25/2019 12/23/2019 12/23/2019  Weight (lbs) 375 lb 10.6 oz 372 lb 1.6 oz 375 lb  Weight (kg) 170.4 kg 168.783 kg 170.099 kg      Telemetry    AFIb>NSR, HR 80-90s - Personally Reviewed  ECG    NSR 70bpm, nonspecific T wave changes inferior leads- Personally Reviewed  Physical Exam   GEN: No acute distress.   Neck: No JVD Cardiac: RRR, no murmurs, rubs, or gallops.  Respiratory: Clear to auscultation bilaterally. GI: Soft, nontender, non-distended  MS: No edema; No deformity. Neuro:  Nonfocal  Psych: Normal affect   Labs    High  Sensitivity Troponin:  No results for input(s): TROPONINIHS in the last 720 hours.    Chemistry Recent Labs  Lab 12/23/19 0150 12/23/19 0637  NA 136 136  K 5.8* 3.7  CL 98 95*  CO2 26 28  GLUCOSE 109* 128*  BUN 13 12  CREATININE 1.05 1.08  CALCIUM 9.3 9.3  PROT  --  7.4  ALBUMIN  --  3.7  AST  --  36  ALT  --  29  ALKPHOS  --  74  BILITOT  --  0.8  GFRNONAA >60 >60  GFRAA >60 >60  ANIONGAP 12 13     Hematology Recent Labs  Lab 12/23/19 0247 12/23/19 0637  WBC 12.4* 11.8*  RBC 5.37 5.29  HGB 13.0 13.0  HCT 43.5 43.2  MCV 81.0 81.7  MCH 24.2* 24.6*  MCHC 29.9* 30.1  RDW 16.7* 16.9*  PLT 186 188    BNP Recent Labs  Lab 12/23/19 0638  BNP 295.7*     DDimer No results for input(s): DDIMER in the last 168 hours.   Radiology    No results found.  Cardiac Studies    2D echo 07/2019 IMPRESSIONS    1. Suboptimal image quality for diagnostic purposes.  2. Left ventricular ejection fraction, by estimation, is 55 to  60%. The  left ventricle has normal function. Left ventricular endocardial border  not optimally defined to evaluate regional wall motion. Left ventricular  diastolic parameters are consistent  with Grade I diastolic dysfunction (impaired relaxation).  3. Right ventricular systolic function was not well visualized. The right  ventricular size is not well visualized.  4. The mitral valve is grossly normal. No evidence of mitral valve  regurgitation. No evidence of mitral stenosis.  5. The aortic valve was not well visualized. Aortic valve regurgitation  is not visualized. No aortic stenosis is present.   [place] 08/09/19 1. Normal filling pressures.  2. Normal PA pressure.  3. Normal cardiac output.     Patient Profile     64 y.o. male with COPD, cirrhosis, DVT/PE on eliquis, mild non-calcified plaque in RCA and LAD on coronary CTA 7/[id number] chronic diastolic HF who presents with palpitations found to be newly in AF w/  RVR.  Assessment & Plan    Atrial fibrillation with RVR - In the setting of mild CHF, untreated OSA, obeisty - started on IV cardizem - TSH normal - Patient did not miss any doses of Eliquis in the last 4 weeks so plan was for DCCV however patient converted to sinus overnight - can consolidate cardizem  Chronic diastolic CHF - EF 51-88% with G1DD 07/2019 - BNP minimally elevated at 295 - CXR unremarkable - Continue home Torsemide 11m daily - SArlyce Harmanrestarted - weights stable  HTN - Bps intermittently soft - Lisinopril on hold for soft pressures - BP this am 108/81. Since patient is dizzy and lightheaded when he stands will check orthostatics.  -Arlyce Harmanand torsemide  OSA - waiting to get his device  For questions or updates, please contact [place] consult [email or web address] for contact info under        Signed, Cadence HNinfa Meeker PA-C  12/25/2019, 7:33 AM    Personally seen and examined. Agree with above.  Converted to normal sinus rhythm from atrial fibrillation.  Feels better but he was still having some dizziness.  Softer blood pressures.  108/81.  On exam regular rate and rhythm alert and oriented x3 lungs are clear minimal lower extremity edema.  Obesity.  Lab work reviewed as above, BNP 295 minimally elevated  Echocardiogram personally reviewed shows EF of 55 to 60% with grade 1 diastolic dysfunction.  Assessment and plan  Paroxysmal atrial fibrillation with rapid ventricular response -Continue to treat underlying risk factors, obesity, obstructive sleep apnea untreated etc. -Thankfully converted.  Continue with Eliquis. CHA2DS2-VASc is at least 2.  He is also on chronic Eliquis because of DVT PE. -We will stop his IV diltiazem 5 mg/h.  Chronic diastolic heart failure, acute exacerbation -Go ahead and hold home torsemide and spironolactone.  BNP was only minimally elevated.  With his heart rate increasing when standing and sensation of dizziness, he  could be slightly intravascular volume down even though his lower extremities appear otherwise. -Tomorrow but we try torsemide 20 mg with spironolactone instead of 40.  Essential hypertension -Agree with holding lisinopril. -While he was standing, his blood pressure did increase 129/93.  Heart rate increased getting out of the chair from upper 70s to 101 sinus rhythm.  Obstructive sleep apnea -Weight and get device.    MCandee Furbish MD

## 2019-12-26 DIAGNOSIS — Z86718 Personal history of other venous thrombosis and embolism: Secondary | ICD-10-CM

## 2019-12-26 DIAGNOSIS — R002 Palpitations: Secondary | ICD-10-CM

## 2019-12-26 DIAGNOSIS — Z7901 Long term (current) use of anticoagulants: Secondary | ICD-10-CM

## 2019-12-26 DIAGNOSIS — I5033 Acute on chronic diastolic (congestive) heart failure: Secondary | ICD-10-CM

## 2019-12-26 HISTORY — DX: Personal history of other venous thrombosis and embolism: Z86.718

## 2019-12-26 HISTORY — DX: Long term (current) use of anticoagulants: Z79.01

## 2019-12-26 HISTORY — DX: Palpitations: R00.2

## 2019-12-26 LAB — CBC
HCT: 37.9 % — ABNORMAL LOW (ref 39.0–52.0)
Hemoglobin: 11.5 g/dL — ABNORMAL LOW (ref 13.0–17.0)
MCH: 24.5 pg — ABNORMAL LOW (ref 26.0–34.0)
MCHC: 30.3 g/dL (ref 30.0–36.0)
MCV: 80.8 fL (ref 80.0–100.0)
Platelets: 133 10*3/uL — ABNORMAL LOW (ref 150–400)
RBC: 4.69 MIL/uL (ref 4.22–5.81)
RDW: 16.3 % — ABNORMAL HIGH (ref 11.5–15.5)
WBC: 5.9 10*3/uL (ref 4.0–10.5)
nRBC: 0 % (ref 0.0–0.2)

## 2019-12-26 LAB — BASIC METABOLIC PANEL
Anion gap: 8 (ref 5–15)
BUN: 16 mg/dL (ref 8–23)
CO2: 30 mmol/L (ref 22–32)
Calcium: 9.1 mg/dL (ref 8.9–10.3)
Chloride: 96 mmol/L — ABNORMAL LOW (ref 98–111)
Creatinine, Ser: 0.9 mg/dL (ref 0.61–1.24)
GFR calc Af Amer: >60 mL/min (ref >60)
GFR calc non Af Amer: >60 mL/min (ref >60)
Glucose, Bld: 102 mg/dL — ABNORMAL HIGH (ref 70–99)
Potassium: 3.9 mmol/L (ref 3.5–5.1)
Sodium: 134 mmol/L — ABNORMAL LOW (ref 135–145)

## 2019-12-26 MED ORDER — TORSEMIDE 20 MG PO TABS
40.0000 mg | ORAL_TABLET | Freq: Every day | ORAL | Status: DC
  Administered 2019-12-26: 40 mg via ORAL
  Filled 2019-12-26: qty 2

## 2019-12-26 MED ORDER — TORSEMIDE 20 MG PO TABS: 20.0000 mg | ORAL_TABLET | Freq: Every day | ORAL | Status: DC

## 2019-12-26 MED ORDER — SPIRONOLACTONE 25 MG PO TABS
25.0000 mg | ORAL_TABLET | Freq: Every day | ORAL | Status: DC
  Administered 2019-12-26: 25 mg via ORAL
  Filled 2019-12-26: qty 1

## 2019-12-26 NOTE — Discharge Summary (Addendum)
Discharge Summary    Patient ID: Charles Gray MRN: [id number]; DOB: June 18, 1955  Admit date: 12/23/2019 Discharge date: 12/26/2019  Primary Care Provider: Enid Skeens., MD  Primary Cardiologist: Berniece Salines, DO  Primary Electrophysiologist:  None   Discharge Diagnoses    Principal Problem:   Atrial fibrillation [facility]) Active Problems:   Benign essential HTN   Class 2 severe obesity due to excess calories with serious comorbidity and body mass index (BMI) of 39.0 to 39.9 in adult [facility])   Acute on chronic diastolic CHF (congestive heart failure) (HCC)   Personal history of DVT (deep vein thrombosis)   Chronic anticoagulation   Palpitations   Diagnostic Studies/Procedures     2D echo 07/2019 IMPRESSIONS   1. Suboptimal image quality for diagnostic purposes.  2. Left ventricular ejection fraction, by estimation, is 55 to 60%. The  left ventricle has normal function. Left ventricular endocardial border  not optimally defined to evaluate regional wall motion. Left ventricular  diastolic parameters are consistent  with Grade I diastolic dysfunction (impaired relaxation).  3. Right ventricular systolic function was not well visualized. The right  ventricular size is not well visualized.  4. The mitral valve is grossly normal. No evidence of mitral valve  regurgitation. No evidence of mitral stenosis.  5. The aortic valve was not well visualized. Aortic valve regurgitation  is not visualized. No aortic stenosis is present.   [place] 08/09/19 1. Normal filling pressures.  2. Normal PA pressure.  3. Normal cardiac output.  _____________   History of Present Illness     Charles Gray is a 64 y.o. male with pmh of COPD, cirrhosis, DVT/PE on Eliquis, mild non-calcified plaque in RCA and LAD on coronary CTA 10/2019 and chronic diastolic CHF who presented to [facility] 12/23/19 with palpitations found to be newly in Afib RVR. He was in his usual state of health with stable  weights and chronic dyspnea until the morning of his presentation. He awoke feeling "unwell" and checked his BP which was normal however realized his Heart rate was elevated to the 140s. He did have palpitations. He called his primary cardiolgist who advised going to the ED however he was reluctant at first. EMS was eventually called who found the patient to be normotensive and in afib with rates up to 160s. He was given IV dilt 36m x 1 with improvement and was brought to the ED  Hospital Course     Consultants: None   The patient presented to the ER early in the morning and EKG confirmed Afib. He reported some nausea but no vomiting with some mild diaphoresis. No chest pain or pressure. Cardioversion was recommended however patient did want to pursue this. He was given IV metoprolol with improvement of heart rate. Labs showed mild hyperkalemia however suspected to be inaccurate due to hemolyzed specimen. Cardiology was consulted and patient was admitted. The patient was started on IV dilt. Patient was already on Eliquis for history of DVT/PE, so this was continued. Lisinopril was held for borderline Bps. His home torsemide and spironolactone were continued. TSH came back normal. Patient had previously had a sleep study however had not received a CPAP so this was started. Heart rates improved with IV dilt. He was scheduled for DCCV however he self-converted the morning of the procedure. He was feeling  lightheaded and dizzy upon standing so IV dilt was stopped. Also torsemide and spiro were held. Patient was not orthostatic. The next day the patient still had some symptoms, however  minimally improved. Bps slightly high. Weights remained stabled. He remained in sinus rhythm. He tried the CPAP but had trouble sleeping. Encouraged patient to continue to use it.  Follow-up labs were stable. Plan to discharge the patient on home Torsemide 40 mg in the AM and 7m in the PM as well as home spironolactone. Plan to  restart other home medications. Will order Home Health for the patient for Heart failure management and education.  The patient was evaluated by Dr. SMarlou Porchon 12/26/19 and felt to be stable for discharge.   Did the patient have an acute coronary syndrome (MI, NSTEMI, STEMI, etc) this admission?:  No                               Did the patient have a percutaneous coronary intervention (stent / angioplasty)?:  No.   _____________  Discharge Vitals Blood pressure 120/66, pulse 70, temperature 98.2 F (36.8 C), temperature source Oral, resp. rate 19, height 6' 10"  (2.083 m), weight (!) 171.4 kg, SpO2 96 %.  Filed Weights   12/23/19 1820 12/25/19 0429 12/26/19 0251  Weight: (!) 168.8 kg (!) 170.4 kg (!) 171.4 kg    Labs & Radiologic Studies    CBC Recent Labs    12/26/19 0943  WBC 5.9  HGB 11.5*  HCT 37.9*  MCV 80.8  PLT 1540   Basic Metabolic Panel Recent Labs    12/26/19 0943  NA 134*  K 3.9  CL 96*  CO2 30  GLUCOSE 102*  BUN 16  CREATININE 0.90  CALCIUM 9.1   Liver Function Tests No results for input(s): AST, ALT, ALKPHOS, BILITOT, PROT, ALBUMIN in the last 72 hours. No results for input(s): LIPASE, AMYLASE in the last 72 hours. High Sensitivity Troponin:   No results for input(s): TROPONINIHS in the last 720 hours.  BNP Invalid input(s): POCBNP D-Dimer No results for input(s): DDIMER in the last 72 hours. Hemoglobin A1C No results for input(s): HGBA1C in the last 72 hours. Fasting Lipid Panel No results for input(s): CHOL, HDL, LDLCALC, TRIG, CHOLHDL, LDLDIRECT in the last 72 hours. Thyroid Function Tests No results for input(s): TSH, T4TOTAL, T3FREE, THYROIDAB in the last 72 hours.  Invalid input(s): FREET3 _____________  No results found. Disposition   Pt is being discharged home today in good condition.  Follow-up Plans & Appointments     Follow-up Information    Tobb, KGodfrey Pick DO Follow up on 01/03/2020.   Specialty: Cardiology Why: @  9:20AM Contact information: [phone]             Discharge Instructions    Amb referral to AFIB Clinic   Complete by: As directed    Amb referral to AFIB Clinic   Complete by: As directed       Discharge Medications   Allergies as of 12/26/2019      Reactions   Penicillins    Did it involve swelling of the face/tongue/throat, SOB, or low BP? N/A Did it involve sudden or severe rash/hives, skin peeling, or any reaction on the inside of your mouth or nose? N/A Did you need to seek medical attention at a hospital or doctor's office?N/A When did it last happen?N/A If all above answers are "NO", may proceed with cephalosporin use.      Medication List    TAKE these medications   acetaminophen 325 MG tablet Commonly known as:  TYLENOL Take 2 tablets (650 mg total) by mouth every 4 (four) hours as needed for fever or mild pain.   apixaban 5 MG Tabs tablet Commonly known as: Eliquis Take 1 tablet (5 mg total) by mouth 2 (two) times daily.   Buprenorphine HCl-Naloxone HCl 8-2 MG Film Place 1 tablet under the tongue in the morning and at bedtime.   diphenhydrAMINE 25 MG tablet Commonly known as: BENADRYL Take 25 mg by mouth every 6 (six) hours as needed for allergies.   gabapentin 800 MG tablet Commonly known as: NEURONTIN Take 1 tablet (800 mg total) by mouth 3 (three) times daily.   lisinopril 5 MG tablet Commonly known as: ZESTRIL Take 1 tablet (5 mg total) by mouth daily.   pantoprazole 40 MG tablet Commonly known as: PROTONIX Take 40 mg by mouth 2 (two) times daily.   polyethylene glycol 17 g packet Commonly known as: MIRALAX / GLYCOLAX Take 17 g by mouth daily. What changed:   when to take this  reasons to take this   pravastatin 20 MG tablet Commonly known as: PRAVACHOL Take 1 tablet (20 mg total) by mouth daily.   ranolazine 500 MG 12 hr tablet Commonly known as: Ranexa Take 1 tablet (500 mg total) by  mouth 2 (two) times daily.   spironolactone 25 MG tablet Commonly known as: ALDACTONE Take 1 tablet (25 mg total) by mouth daily.   torsemide 20 MG tablet Commonly known as: DEMADEX Take 2 tablets (40 mg total) by mouth every morning AND 1 tablet (20 mg total) every evening.   Ventolin HFA 108 (90 Base) MCG/ACT inhaler Generic drug: albuterol Inhale 1 puff into the lungs every 4 (four) hours as needed for shortness of breath.   zolpidem 10 MG tablet Commonly known as: AMBIEN Take 10 mg by mouth at bedtime as needed for sleep.          Outstanding Labs/Studies   None  Duration of Discharge Encounter   Greater than 30 minutes including physician time.  Signed, Cadence Ninfa Meeker, PA-C 12/26/2019, 11:31 AM   Personally seen and examined. Agree with above.  Atrial fibrillation with rapid ventricular response -Converted to sinus rhythm. Auto converted. Continue with Eliquis.  Chronic diastolic heart failure -EF 55 to 60% restarting diuretics.  OSA -Continue with CPAP.  Okay with home health.  Yesterday we held his torsemide and spironolactone. We are going ahead and restarting today at current home dosing. No further dizziness. He is obviously concerned about going back to his apartment and [place]. We are going to see if we can help him get home health. His primary care doctor has tried and has ordered but according to him it has been challenging to get a nurse to come out.  No evidence of orthostatics by blood pressure yesterday.  Tried his CPAP but was unsuccessful last night.  Continue to work on fluid restriction and salt restrictions. Medications as above.  Okay for discharge. He is at high risk for readmission.  Candee Furbish, MD

## 2019-12-26 NOTE — Progress Notes (Addendum)
Progress Note  Patient Name: Charles Gray Date of Encounter: 12/26/2019  [facility] Cardiologist: Berniece Salines, DO   Subjective   Patient remains in SNR. Says he feeling about the same as yesterday. Still feels lightheaded and dizzy when he stands up, however was not orthostatic on exam. Bps overall better. Says he had twinge of CP last night. Has some sob on exertion. Will check labs this AM.   Inpatient Medications    Scheduled Meds: . apixaban  5 mg Oral BID  . buprenorphine-naloxone  1 tablet Sublingual BID  . gabapentin  800 mg Oral TID  . pantoprazole  40 mg Oral BID  . pravastatin  20 mg Oral QHS  . ranolazine  500 mg Oral BID   Continuous Infusions:  PRN Meds: acetaminophen, LORazepam, ondansetron (ZOFRAN) IV, zolpidem   Vital Signs    Vitals:   12/25/19 1242 12/25/19 2113 12/26/19 0038 12/26/19 0251  BP: 127/82 (!) 141/86 110/63 123/73  Pulse: 97 78 72 93  Resp: 18 18 16 19   Temp: 98.4 F (36.9 C) 98.4 F (36.9 C) 98.6 F (37 C) 98.6 F (37 C)  TempSrc: Axillary Axillary Oral Oral  SpO2: 100% 97% 97% 96%  Weight:    (!) 171.4 kg  Height:        Intake/Output Summary (Last 24 hours) at 12/26/2019 0736 Last data filed at 12/25/2019 2117 Gross per 24 hour  Intake 498 ml  Output --  Net 498 ml   Last 3 Weights 12/26/2019 12/25/2019 12/23/2019  Weight (lbs) 377 lb 14.4 oz 375 lb 10.6 oz 372 lb 1.6 oz  Weight (kg) 171.414 kg 170.4 kg 168.783 kg      Telemetry    NSR, HR 70-80s - Personally Reviewed  ECG    No new - Personally Reviewed  Physical Exam   GEN: No acute distress.   Neck: No JVD Cardiac: RRR, no murmurs, rubs, or gallops.  Respiratory: Clear to auscultation bilaterally. GI: Soft, nontender, non-distended  MS: minimal edema; No deformity. Neuro:  Nonfocal  Psych: Normal affect   Labs    High Sensitivity Troponin:  No results for input(s): TROPONINIHS in the last 720 hours.    Chemistry Recent Labs  Lab 12/23/19 0150  12/23/19 0637  NA 136 136  K 5.8* 3.7  CL 98 95*  CO2 26 28  GLUCOSE 109* 128*  BUN 13 12  CREATININE 1.05 1.08  CALCIUM 9.3 9.3  PROT  --  7.4  ALBUMIN  --  3.7  AST  --  36  ALT  --  29  ALKPHOS  --  74  BILITOT  --  0.8  GFRNONAA >60 >60  GFRAA >60 >60  ANIONGAP 12 13     Hematology Recent Labs  Lab 12/23/19 0247 12/23/19 0637  WBC 12.4* 11.8*  RBC 5.37 5.29  HGB 13.0 13.0  HCT 43.5 43.2  MCV 81.0 81.7  MCH 24.2* 24.6*  MCHC 29.9* 30.1  RDW 16.7* 16.9*  PLT 186 188    BNP Recent Labs  Lab 12/23/19 0638  BNP 295.7*     DDimer No results for input(s): DDIMER in the last 168 hours.   Radiology    No results found.  Cardiac Studies   2D echo 07/2019 IMPRESSIONS   1. Suboptimal image quality for diagnostic purposes.  2. Left ventricular ejection fraction, by estimation, is 55 to 60%. The  left ventricle has normal function. Left ventricular endocardial border  not optimally defined to  evaluate regional wall motion. Left ventricular  diastolic parameters are consistent  with Grade I diastolic dysfunction (impaired relaxation).  3. Right ventricular systolic function was not well visualized. The right  ventricular size is not well visualized.  4. The mitral valve is grossly normal. No evidence of mitral valve  regurgitation. No evidence of mitral stenosis.  5. The aortic valve was not well visualized. Aortic valve regurgitation  is not visualized. No aortic stenosis is present.   [place] 08/09/19 1. Normal filling pressures.  2. Normal PA pressure.  3. Normal cardiac output.   Patient Profile     64 y.o. male with COPD, cirrhosis, DVT/PE on eliquis,mild non-calcified plaque in RCA and LAD on coronary CTA [id number]/6720NOB chronic diastolic HF who presents with palpitations found to be newly in AF w/ RVR.  Assessment & Plan    Atrial fibrillation with RVR - In the setting of mild CHF, untreated OSA, obesity - started on IV cardizem - TSH normal -  Patient did not miss any doses of Eliquis in the last 4 weeks so plan was for DCCV however patient self converted to sinus  - Given low pressures and lightheadedness and dizziness cardizem stopped - Patient remains in sinus  Chronic diastolic CHF - EF 09-62% with G1DD 07/2019 - BNP minimally elevated at 295 - CXR unremarkable - PTA Torsemide 41m in the AM And 20 mg in the PM and spironolactone 239m- weights stable - Torsemide and spiro held for orthostatic symptoms. Patient says he still has lightheadedness and dizziness however suspect okay to restart diuretics.  HTN - Bps intermittently soft - Lisinopril on hold for soft pressures - BP this am 123/73.   OSA - Used CPAP last night and had a hard time sleeping but will continue to try   For questions or updates, please contact [place] consult [email or web address] for contact info under        Signed, Cadence H Ninfa MeekerPA-C  12/26/2019, 7:36 AM    Personally seen and examined. Agree with above.  Atrial fibrillation with rapid ventricular response -Converted to sinus rhythm. Auto converted. Continue with Eliquis.  Chronic diastolic heart failure -EF 55 to 60% restarting diuretics.  OSA -Continue with CPAP.  Okay with home health.  Yesterday we held his torsemide and spironolactone. We are going ahead and restarting today at current home dosing. No further dizziness. He is obviously concerned about going back to his apartment and [place]. We are going to see if we can help him get home health. His primary care doctor has tried and has ordered but according to him it has been challenging to get a nurse to come out.  No evidence of orthostatics by blood pressure yesterday.  Tried his CPAP but was unsuccessful last night.  Continue to work on fluid restriction and salt restrictions. Medications as above.  Okay for discharge. He is at high risk for readmission.  MaCandee FurbishMD

## 2019-12-27 ENCOUNTER — Telehealth (HOSPITAL_COMMUNITY): Payer: Self-pay | Admitting: Cardiology

## 2019-12-27 NOTE — Telephone Encounter (Signed)
Patient approved for cardiomems auth valid until 01/26/20  Attempting to contact patient for scheduling  [facility] 11/20/19 [facility] 12/27/19

## 2019-12-28 ENCOUNTER — Telehealth: Payer: Self-pay | Admitting: Cardiology

## 2019-12-28 NOTE — Telephone Encounter (Signed)
New Message:   Pt had an IV when he was in the hospital. He said he called to the hospital and told them he had a big red knott where the IV was and it feels tight and numb. He was told to call Dr Sherren Mocha and tell her about it, said it sounds like an infection. He said they said he might need an Antibiotic.

## 2019-12-28 NOTE — Telephone Encounter (Signed)
Left message for the pt to call back.

## 2019-12-28 NOTE — Telephone Encounter (Addendum)
Pt was discharged from the hospital 12/26/19 and he is reporting that his Left hand where is IV was in, is red and raised and very painful and he called the floor he was on at [facility] and they advised he may have an infection and he should contact Dr. Harriet Masson for possible antibiotics.   Pt also reporting that he does not feel well.. he says he has been very weak, tired, and nauseated since he was sent home. He feels he was sent home too soon. He denies fever, cough, shortness of breath. No Palpitations, chest pain, or dizziness.   Noted on the discharge summary pt to follow up in the Afib clinic but he does not have an appt yet... he says he needs a couple days notice since he uses [facility] transport to get to and from his appts.   His next appt with Dr. Harriet Masson post hospital is 01/03/20.   I spoke with the [facility] and they report that pts with previously scheduled appts with their primary cardiologist do not usually get a follow up with them until after they are seen and if the cardiologist wants to place another referral the post hospital visit. Pt was D/C in NSR.   Will forward to Dr. Harriet Masson for her review.

## 2019-12-28 NOTE — Telephone Encounter (Signed)
Please let him know I recommend he call his pcp so he can get seen there sooner to take a look at his IV site. That way it may be examined and determine if her needs antibiotics.

## 2019-12-28 NOTE — Telephone Encounter (Signed)
Upon patient request DME selection is [facility]. Patient understands he will be contacted by [place] [facility] to set up his cpap. Patient understands to call if [facility] [place] [facility] does not contact him with new setup in a timely manner. Patient understands they will be called once confirmation has been received from [facility] that they have received their new machine to schedule 10 week follow up appointment.  [facility] notified of new cpap order  Please add to airview Patient was grateful for the call and thanked me.

## 2019-12-28 NOTE — Telephone Encounter (Signed)
Spoke with the pt and he will try calling Dr. Wendie Agreste his PCP this afternoon re: his hand.

## 2019-12-28 NOTE — Telephone Encounter (Signed)
Patient has a 10 week follow up appointment scheduled for 02/29/20. Patient understands he needs to keep this appointment for insurance compliance. Patient was grateful for the call and thanked me.

## 2020-01-03 ENCOUNTER — Ambulatory Visit: Payer: Medicare HMO | Admitting: Cardiology

## 2020-01-05 ENCOUNTER — Other Ambulatory Visit: Payer: Self-pay

## 2020-01-05 ENCOUNTER — Encounter: Payer: Self-pay | Admitting: Cardiology

## 2020-01-05 ENCOUNTER — Ambulatory Visit (INDEPENDENT_AMBULATORY_CARE_PROVIDER_SITE_OTHER): Payer: Medicare HMO | Admitting: Cardiology

## 2020-01-05 VITALS — BP 136/83 | HR 80 | Ht >= 80 in | Wt 376.4 lb

## 2020-01-05 DIAGNOSIS — E782 Mixed hyperlipidemia: Secondary | ICD-10-CM | POA: Diagnosis not present

## 2020-01-05 DIAGNOSIS — I2782 Chronic pulmonary embolism: Secondary | ICD-10-CM

## 2020-01-05 DIAGNOSIS — I480 Paroxysmal atrial fibrillation: Secondary | ICD-10-CM | POA: Diagnosis not present

## 2020-01-05 DIAGNOSIS — I10 Essential (primary) hypertension: Secondary | ICD-10-CM

## 2020-01-05 DIAGNOSIS — I5032 Chronic diastolic (congestive) heart failure: Secondary | ICD-10-CM | POA: Diagnosis not present

## 2020-01-05 DIAGNOSIS — I2602 Saddle embolus of pulmonary artery with acute cor pulmonale: Secondary | ICD-10-CM | POA: Diagnosis not present

## 2020-01-05 DIAGNOSIS — I2510 Atherosclerotic heart disease of native coronary artery without angina pectoris: Secondary | ICD-10-CM

## 2020-01-05 DIAGNOSIS — Z6841 Body Mass Index (BMI) 40.0 and over, adult: Secondary | ICD-10-CM

## 2020-01-05 MED ORDER — DILTIAZEM HCL ER COATED BEADS 120 MG PO CP24: 120.0000 mg | ORAL_CAPSULE | Freq: Every day | ORAL | 3 refills | Status: DC

## 2020-01-05 NOTE — Patient Instructions (Addendum)
Medication Instructions:  1) Start Diltiazem 120 mg daily  2) Stop Lisinopril   3) Stop Ranexa    *If you need a refill on your cardiac medications before your next appointment, please call your pharmacy*   Lab Work: None ordered   If you have labs (blood work) drawn today and your tests are completely normal, you will receive your results only by: Marland Kitchen MyChart Message (if you have MyChart) OR . A paper copy in the mail If you have any lab test that is abnormal or we need to change your treatment, we will call you to review the results.   Testing/Procedures: None ordered    Follow-Up: At [facility], you and your health needs are our priority.  As part of our continuing mission to provide you with exceptional heart care, we have created designated Provider Care Teams.  These Care Teams include your primary Cardiologist (physician) and Advanced Practice Providers (APPs -  Physician Assistants and Nurse Practitioners) who all work together to provide you with the care you need, when you need it.  We recommend signing up for the patient portal called "MyChart".  Sign up information is provided on this After Visit Summary.  MyChart is used to connect with patients for Virtual Visits (Telemedicine).  Patients are able to view lab/test results, encounter notes, upcoming appointments, etc.  Non-urgent messages can be sent to your provider as well.   To learn more about what you can do with MyChart, go to [email or web address].    Your next appointment:   1 month(s)  The format for your next appointment:   In Person  Provider:   Berniece Salines, DO   Other Instructions None

## 2020-01-05 NOTE — Progress Notes (Signed)
Cardiology Office Note:    Date:  01/05/2020   ID:  Charles Gray, DOB 1955-08-17, MRN [id number]  PCP:  Enid Skeens., MD  Cardiologist:  Berniece Salines, DO  Electrophysiologist:  None   Referring MD: Enid Skeens., MD   Follow up visit  History of Present Illness:    Charles Gray is a 64 y.o. male with a hx of coronary artery disease seen with mild noncalcified plaque in RCA and LAD on coronary CTA done in July 0932, chronic diastolic heart failure, hypertension, pulmonary embolism on Eliquis and recently diagnosed atrial fibrillation with rapid ventricular rate on December 23, 2019.  The patient called the office as he was experiencing significant palpitations.  We recommended he go to the emergency room to be evaluated.  He did call EMS and was taken to the emergency room after he was found to be atrial fibrillation rapid ventricular rate.  Was giving 10 mg IV Cardizem x1 which improved his heart rate.  He converted spontaneously while in the hospital.  His CPAP at that time was also started while in the hospital.  He was discharged on December 26, 2019.  The patient is here today for follow-up visit. He feels a lot better.    Past Medical History:  Diagnosis Date  . Acute on chronic congestive heart failure ([place])   . Acute on chronic diastolic CHF (congestive heart failure) ([place]) 08/23/2019  . Acute on chronic right-sided heart failure ([place]) 08/06/2019  . Acute respiratory failure with hypoxia (Charles Gray)   . Anemia of chronic disease   . Atrial tachycardia ([place]) 08/12/2019  . Benign essential HTN   . Chest pain 08/23/2019  . Chronic heart failure with preserved EF 05/30/2019   Echo 07/2019: EF 55-60, GR 1 DD  . Chronic pain syndrome   . Cirrhosis of liver without ascites ([place])   . Class 2 severe obesity due to excess calories with serious comorbidity and body mass index (BMI) of 39.0 to 39.9 in adult ([place]) 05/30/2019  . Debility 03/13/2019  . Drug induced constipation   .  Dyspnea   . Fatty liver disease, nonalcoholic   . GIB (gastrointestinal bleeding) 03/04/2019  . Heart failure, systolic, acute ([place]) 6/71/2458  . History of pulmonary embolism 08/12/2019  . Hyperlipidemia 01/24/2019  . Hypertension 01/24/2019  . Hypoalbuminemia due to protein-calorie malnutrition ([place])   . Hypokalemia   . Hyponatremia   . Hypotension due to drugs   . Insomnia 01/24/2019  . Lactic acidosis 08/23/2019  . Lumbar disc herniation 01/24/2019  . Mixed hyperlipidemia 01/24/2019  . Morbid obesity with BMI of 40.0-44.9, adult ([place]) 05/30/2019  . Normocytic anemia 06/08/2019  . Occasional tremors 08/12/2019  . OSA (obstructive sleep apnea) 08/12/2019  . Peripheral edema   . Portal hypertensive gastropathy ([place]) 03/06/2019   Seen on EGD 03/06/2019  . Pulmonary emboli ([place]) 06/08/2019  . Pulmonary embolism ([place]) 06/08/2019  . Redness and swelling of lower leg 08/23/2019  . Shock ([place])   . Thrombocytopenia ([place])     Past Surgical History:  Procedure Laterality Date  . ESOPHAGOGASTRODUODENOSCOPY (EGD) WITH PROPOFOL N/A 03/05/2019   Procedure: ESOPHAGOGASTRODUODENOSCOPY (EGD) WITH PROPOFOL;  Surgeon: Doran Stabler, MD;  Location: [facility];  Service: Gastroenterology;  Laterality: N/A;  Large Blood Clot Removed  . ESOPHAGOGASTRODUODENOSCOPY (EGD) WITH PROPOFOL N/A 03/06/2019   Procedure: ESOPHAGOGASTRODUODENOSCOPY (EGD) WITH PROPOFOL;  Surgeon: Thornton Park, MD;  Location: [facility];  Service: Gastroenterology;  Laterality: N/A;  . HEMORROIDECTOMY    .  RIGHT HEART CATH N/A 08/09/2019   Procedure: RIGHT HEART CATH;  Surgeon: Larey Dresser, MD;  Location: [facility];  Service: Cardiovascular;  Laterality: N/A;    Current Medications: Current Meds  Medication Sig  . acetaminophen (TYLENOL) 325 MG tablet Take 2 tablets (650 mg total) by mouth every 4 (four) hours as needed for fever or mild pain.  Marland Kitchen apixaban (ELIQUIS) 5 MG TABS tablet Take 1 tablet (5 mg total)  by mouth 2 (two) times daily.  . Buprenorphine HCl-Naloxone HCl 8-2 MG FILM Place 1 tablet under the tongue in the morning and at bedtime.   . diphenhydrAMINE (BENADRYL) 25 MG tablet Take 25 mg by mouth every 6 (six) hours as needed for allergies.   Marland Kitchen gabapentin (NEURONTIN) 800 MG tablet Take 1 tablet (800 mg total) by mouth 3 (three) times daily.  Marland Kitchen lisinopril (ZESTRIL) 5 MG tablet Take 1 tablet (5 mg total) by mouth daily.  . pantoprazole (PROTONIX) 40 MG tablet Take 40 mg by mouth 2 (two) times daily.  . polyethylene glycol (MIRALAX / GLYCOLAX) 17 g packet Take 17 g by mouth daily. (Patient taking differently: Take 17 g by mouth daily as needed for mild constipation. )  . pravastatin (PRAVACHOL) 20 MG tablet Take 1 tablet (20 mg total) by mouth daily.  . ranolazine (RANEXA) 500 MG 12 hr tablet Take 1 tablet (500 mg total) by mouth 2 (two) times daily.  Marland Kitchen spironolactone (ALDACTONE) 25 MG tablet Take 1 tablet (25 mg total) by mouth daily.  Marland Kitchen torsemide (DEMADEX) 20 MG tablet Take 2 tablets (40 mg total) by mouth every morning AND 1 tablet (20 mg total) every evening.  . VENTOLIN HFA 108 (90 Base) MCG/ACT inhaler Inhale 1 puff into the lungs every 4 (four) hours as needed for shortness of breath.  . zolpidem (AMBIEN) 10 MG tablet Take 10 mg by mouth at bedtime as needed for sleep.      Allergies:   Penicillins   Social History   Socioeconomic History  . Marital status: Unknown    Spouse name: Not on file  . Number of children: Not on file  . Years of education: Not on file  . Highest education level: Not on file  Occupational History  . Not on file  Tobacco Use  . Smoking status: Never Smoker  . Smokeless tobacco: Never Used  Vaping Use  . Vaping Use: Never used  Substance and Sexual Activity  . Alcohol use: Never  . Drug use: Not Currently    Types: Marijuana    Comment: Smoked for 30 years  . Sexual activity: Not on file  Other Topics Concern  . Not on file  Social History  Narrative  . Not on file   Social Determinants of Health   Financial Resource Strain:   . Difficulty of Paying Living Expenses: Not on file  Food Insecurity:   . Worried About Charity fundraiser in the Last Year: Not on file  . Ran Out of Food in the Last Year: Not on file  Transportation Needs:   . Lack of Transportation (Medical): Not on file  . Lack of Transportation (Non-Medical): Not on file  Physical Activity:   . Days of Exercise per Week: Not on file  . Minutes of Exercise per Session: Not on file  Stress:   . Feeling of Stress : Not on file  Social Connections:   . Frequency of Communication with Friends and Family: Not on file  .  Frequency of Social Gatherings with Friends and Family: Not on file  . Attends Religious Services: Not on file  . Active Member of Clubs or Organizations: Not on file  . Attends Archivist Meetings: Not on file  . Marital Status: Not on file     Family History: The patient's family history includes CAD in his maternal grandfather and maternal grandmother; Hyperlipidemia in his mother; Hypertension in his mother; Stroke in his mother.  ROS:   Review of Systems  Constitution: Negative for decreased appetite, fever and weight gain.  HENT: Negative for congestion, ear discharge, hoarse voice and sore throat.   Eyes: Negative for discharge, redness, vision loss in right eye and visual halos.  Cardiovascular: Negative for chest pain, dyspnea on exertion, leg swelling, orthopnea and palpitations.  Respiratory: Negative for cough, hemoptysis, shortness of breath and snoring.   Endocrine: Negative for heat intolerance and polyphagia.  Hematologic/Lymphatic: Negative for bleeding problem. Does not bruise/bleed easily.  Skin: Negative for flushing, nail changes, rash and suspicious lesions.  Musculoskeletal: Negative for arthritis, joint pain, muscle cramps, myalgias, neck pain and stiffness.  Gastrointestinal: Negative for abdominal pain,  bowel incontinence, diarrhea and excessive appetite.  Genitourinary: Negative for decreased libido, genital sores and incomplete emptying.  Neurological: Negative for brief paralysis, focal weakness, headaches and loss of balance.  Psychiatric/Behavioral: Negative for altered mental status, depression and suicidal ideas.  Allergic/Immunologic: Negative for HIV exposure and persistent infections.    EKGs/Labs/Other Studies Reviewed:    The following studies were reviewed today:   EKG:  The ekg ordered today demonstrates sinus rhythm, heart rate 80 bpm with left anterior fascicular block  2D echo 07/2019 IMPRESSIONS  1. Suboptimal image quality for diagnostic purposes.  2. Left ventricular ejection fraction, by estimation, is 55 to 60%. The left ventricle has normal function. Left ventricular endocardial border not optimally defined to evaluate regional wall motion. Left ventricular  diastolic parameters are consistent with Grade I diastolic dysfunction (impaired relaxation).  3. Right ventricular systolic function was not well visualized. The right ventricular size is not well visualized.  4. The mitral valve is grossly normal. No evidence of mitral valve regurgitation. No evidence of mitral stenosis.  5. The aortic valve was not well visualized. Aortic valve regurgitation is not visualized. No aortic stenosis is present.   [place] 08/09/19 1. Normal filling pressures.  2. Normal PA pressure.  3. Normal cardiac output.   Recent Labs: 02/27/2019: NT-Pro BNP 37 12/23/2019: ALT 29; B Natriuretic Peptide 295.7; Magnesium 2.1; TSH 2.472 12/26/2019: BUN 16; Creatinine, Ser 0.90; Hemoglobin 11.5; Platelets 133; Potassium 3.9; Sodium 134  Recent Lipid Panel    Component Value Date/Time   CHOL 140 11/07/2019 1244   CHOL 128 01/24/2019 1121   TRIG 70 11/07/2019 1244   HDL 48 11/07/2019 1244   HDL 42 01/24/2019 1121   CHOLHDL 2.9 11/07/2019 1244   VLDL 14 11/07/2019 1244   LDLCALC 78  11/07/2019 1244   LDLCALC 64 01/24/2019 1121    Physical Exam:    VS:  BP 136/83   Pulse 80   Ht 6' 10"  (2.083 m)   Wt (!) 376 lb 6.4 oz (170.7 kg)   SpO2 94%   BMI 39.36 kg/m     Wt Readings from Last 3 Encounters:  01/05/20 (!) 376 lb 6.4 oz (170.7 kg)  12/26/19 (!) 377 lb 14.4 oz (171.4 kg)  11/07/19 (!) 376 lb (170.6 kg)     GEN: Well nourished, well developed in  no acute distress HEENT: Normal NECK: No JVD; No carotid bruits LYMPHATICS: No lymphadenopathy CARDIAC: S1S2 noted,RRR, no murmurs, rubs, gallops RESPIRATORY:  Clear to auscultation without rales, wheezing or rhonchi  ABDOMEN: Soft, non-tender, non-distended, +bowel sounds, no guarding. EXTREMITIES: No edema, No cyanosis, no clubbing MUSCULOSKELETAL:  No deformity  SKIN: Warm and dry NEUROLOGIC:  Alert and oriented x 3, non-focal PSYCHIATRIC:  Normal affect, good insight  ASSESSMENT:    1. Paroxysmal atrial fibrillation (HCC)   2. Chronic heart failure with preserved ejection fraction ([place])   3. Chronic Pulmonary embolism   4. Mixed hyperlipidemia   5. Essential hypertension   6. Coronary artery disease involving native coronary artery of native heart without angina pectoris   7. Morbid obesity with BMI of 40.0-44.9, adult ([place])    PLAN:    He is in sinus rhythm, He needs to be on a rate control agent. I will start him on cardizem 120 mg daily. He is already on Eliquis 5 mg twice daily due to pulmonary embolism.  I will stop lisinopril ( to avoid hypotension) and renaxa (hoping some antianginal effect from cardizem).   His leg edema has improved significantly. Continue current diuretic dose.   His recently started is cpap.   His has had previous angina which is controlled on renaxa. I am stoping his renaxa hoping he will get antianginal effect on cardizem in an effort to decrease total amount of medications.   He is on pravastatin due to history of liver cirrhosis and has tolerated pravastatin.  With  his history of liver cirrhosis in mind keeping the patient on pravastatin for now.  The patient is in agreement with the above plan. The patient left the office in stable condition.  The patient will follow up in 1 month due to medication change.   Medication Adjustments/Labs and Tests Ordered: Current medicines are reviewed at length with the patient today.  Concerns regarding medicines are outlined above.  Orders Placed This Encounter  Procedures  . EKG 12-Lead   Meds ordered this encounter  Medications  . diltiazem (CARDIZEM CD) 120 MG 24 hr capsule    Sig: Take 1 capsule (120 mg total) by mouth daily.    Dispense:  90 capsule    Refill:  3    Patient Instructions  Medication Instructions:  Start Diltiazem 120 mg daily   *If you need a refill on your cardiac medications before your next appointment, please call your pharmacy*   Lab Work: None ordered   If you have labs (blood work) drawn today and your tests are completely normal, you will receive your results only by: Marland Kitchen MyChart Message (if you have MyChart) OR . A paper copy in the mail If you have any lab test that is abnormal or we need to change your treatment, we will call you to review the results.   Testing/Procedures: None ordered    Follow-Up: At [facility], you and your health needs are our priority.  As part of our continuing mission to provide you with exceptional heart care, we have created designated Provider Care Teams.  These Care Teams include your primary Cardiologist (physician) and Advanced Practice Providers (APPs -  Physician Assistants and Nurse Practitioners) who all work together to provide you with the care you need, when you need it.  We recommend signing up for the patient portal called "MyChart".  Sign up information is provided on this After Visit Summary.  MyChart is used to connect with patients for  Virtual Visits (Telemedicine).  Patients are able to view lab/test results, encounter  notes, upcoming appointments, etc.  Non-urgent messages can be sent to your provider as well.   To learn more about what you can do with MyChart, go to [email or web address].    Your next appointment:   1 month(s)  The format for your next appointment:   In Person  Provider:   Berniece Salines, DO   Other Instructions None      Adopting a Healthy Lifestyle.  Know what a healthy weight is for you (roughly BMI <25) and aim to maintain this   Aim for 7+ servings of fruits and vegetables daily   65-80+ fluid ounces of water or unsweet tea for healthy kidneys   Limit to max 1 drink of alcohol per day; avoid smoking/tobacco   Limit animal fats in diet for cholesterol and heart health - choose grass fed whenever available   Avoid highly processed foods, and foods high in saturated/trans fats   Aim for low stress - take time to unwind and care for your mental health   Aim for 150 min of moderate intensity exercise weekly for heart health, and weights twice weekly for bone health   Aim for 7-9 hours of sleep daily   When it comes to diets, agreement about the perfect plan isnt easy to find, even among the experts. Experts at the [employer] developed an idea known as the Healthy Eating Plate. Just imagine a plate divided into logical, healthy portions.   The emphasis is on diet quality:   Load up on vegetables and fruits - one-half of your plate: Aim for color and variety, and remember that potatoes dont count.   Go for whole grains - one-quarter of your plate: Whole wheat, barley, wheat berries, quinoa, oats, brown rice, and foods made with them. If you want pasta, go with whole wheat pasta.   Protein power - one-quarter of your plate: Fish, chicken, beans, and nuts are all healthy, versatile protein sources. Limit red meat.   The diet, however, does go beyond the plate, offering a few other suggestions.   Use healthy plant oils, such as olive, canola,  soy, corn, sunflower and peanut. Check the labels, and avoid partially hydrogenated oil, which have unhealthy trans fats.   If youre thirsty, drink water. Coffee and tea are good in moderation, but skip sugary drinks and limit milk and dairy products to one or two daily servings.   The type of carbohydrate in the diet is more important than the amount. Some sources of carbohydrates, such as vegetables, fruits, whole grains, and beans-are healthier than others.   Finally, stay active  Signed, Berniece Salines, DO  01/05/2020 12:07 PM    [place]

## 2020-01-16 NOTE — Telephone Encounter (Signed)
Pt aware of approval-not interested in scheduling at this time Would like to discuss with family and return call. Patient advised Charles Gray is only valid until 10/15

## 2020-01-18 ENCOUNTER — Telehealth: Payer: Self-pay | Admitting: Cardiology

## 2020-01-18 NOTE — Telephone Encounter (Signed)
I spoke with patient. He reports worsening shortness of breath over the last couple of days. Occurs at rest and with exertion.  Swelling in legs, feet and ankles started in last week.  Thinks it started after he began taking Cardizem last Friday.  Knees painful due to swelling. He weighed twice this morning. First weight was 382 lbs and second weight was 380 lbs.  Other weights listed below. Our list indicates he is taking torsemide 40 mg in the morning and 20 mg in the afternoon.  Patient reports for the last couple of weeks he was taking 20 mg twice daily.  States PCP told him to decrease dose for awhile due to possibly being dehydrated when seen by PCP.  Patient reports for the last 2 days he has been taking 40 mg in the AM and 20 mg in the PM.  He tries to watch his salt intake and no recent change in intake other than eating a subway sandwich recently. Eats Healthy Choice and Lean Cuisine meals.  Has been seen in CHF clinic but patient reports Dr Harriet Masson has been ordering his fluid medications. Patient aware if shortness of breath worsens he should call 911. Will forward to Dr Harriet Masson for advice Patient does have appointment with Dr Harriet Masson on 01/26/20

## 2020-01-18 NOTE — Telephone Encounter (Signed)
Pt c/o swelling: STAT is pt has developed SOB within 24 hours  1) How much weight have you gained and in what time span? 5 lbs in a day or 2   2) If swelling, where is the swelling located? Legs and feet  3) Are you currently taking a fluid pill? yes  4) Are you currently SOB? no  5) Do you have a log of your daily weights (if so, list)? This morning 382 lbs, 380 lbs just now, Tuesday 378 lbs, Monday 378 lbs, Saturday 375 lbs  6) Have you gained 3 pounds in a day or 5 pounds in a week? 5 lbs in a day or 2  7) Have you traveled recently? no  Patient states he has gained about 5 lbs in 1-2 days. He states he is also having swelling in his legs and feet. He states he has been getting headaches, breaks out in a sweat, has some trouble breathing sometimes.

## 2020-01-22 NOTE — Telephone Encounter (Signed)
He can hold the Cardizem for now. Please check and see of the swelling came down. If not ask him to take an extra 78m torsemide at night today and tomorrow.

## 2020-01-22 NOTE — Telephone Encounter (Signed)
Patient calling back. He states he hasn't taken the medication due to the swelling.

## 2020-01-22 NOTE — Telephone Encounter (Signed)
Spoke with Charles Gray and let him know that Dr. Harriet Masson said that he could continue to hold his Cardizem for now. He states that his swelling has not gone down at all and he feels like that it is actually slightly worse. I advised that he could take the extra 20 mg tablet of torsemide tonight and tomorrow night. Patient states that he has been feeling slightly dizzy and has had a 101 fever. I advised that he should be evaluated by his PCP or go to urgent care to be evaluated for this fever and dizziness. He verbalizes understanding and thanks me for the call back.

## 2020-01-24 DIAGNOSIS — R579 Shock, unspecified: Secondary | ICD-10-CM | POA: Insufficient documentation

## 2020-01-24 DIAGNOSIS — K760 Fatty (change of) liver, not elsewhere classified: Secondary | ICD-10-CM | POA: Insufficient documentation

## 2020-01-24 DIAGNOSIS — J9601 Acute respiratory failure with hypoxia: Secondary | ICD-10-CM | POA: Insufficient documentation

## 2020-01-26 ENCOUNTER — Encounter: Payer: Self-pay | Admitting: Cardiology

## 2020-01-26 ENCOUNTER — Inpatient Hospital Stay (HOSPITAL_COMMUNITY)
Admission: EM | Admit: 2020-01-26 | Discharge: 2020-02-09 | DRG: 291 | Disposition: A | Discharge status: Home Health | Payer: Medicare HMO | Attending: Student | Admitting: Student

## 2020-01-26 ENCOUNTER — Ambulatory Visit (INDEPENDENT_AMBULATORY_CARE_PROVIDER_SITE_OTHER): Payer: Medicare HMO | Admitting: Cardiology

## 2020-01-26 ENCOUNTER — Encounter (HOSPITAL_COMMUNITY): Payer: Self-pay | Admitting: Emergency Medicine

## 2020-01-26 ENCOUNTER — Other Ambulatory Visit: Payer: Self-pay

## 2020-01-26 ENCOUNTER — Emergency Department (HOSPITAL_COMMUNITY): Payer: Medicare HMO

## 2020-01-26 VITALS — BP 158/82 | HR 97 | Ht >= 80 in | Wt 381.8 lb

## 2020-01-26 DIAGNOSIS — I10 Essential (primary) hypertension: Secondary | ICD-10-CM | POA: Diagnosis present

## 2020-01-26 DIAGNOSIS — I959 Hypotension, unspecified: Secondary | ICD-10-CM | POA: Diagnosis not present

## 2020-01-26 DIAGNOSIS — G629 Polyneuropathy, unspecified: Secondary | ICD-10-CM | POA: Diagnosis present

## 2020-01-26 DIAGNOSIS — I5082 Biventricular heart failure: Secondary | ICD-10-CM | POA: Diagnosis present

## 2020-01-26 DIAGNOSIS — E782 Mixed hyperlipidemia: Secondary | ICD-10-CM | POA: Diagnosis present

## 2020-01-26 DIAGNOSIS — I5033 Acute on chronic diastolic (congestive) heart failure: Secondary | ICD-10-CM

## 2020-01-26 DIAGNOSIS — I5032 Chronic diastolic (congestive) heart failure: Secondary | ICD-10-CM

## 2020-01-26 DIAGNOSIS — I480 Paroxysmal atrial fibrillation: Secondary | ICD-10-CM | POA: Diagnosis present

## 2020-01-26 DIAGNOSIS — G894 Chronic pain syndrome: Secondary | ICD-10-CM | POA: Diagnosis present

## 2020-01-26 DIAGNOSIS — K5900 Constipation, unspecified: Secondary | ICD-10-CM | POA: Diagnosis not present

## 2020-01-26 DIAGNOSIS — E873 Alkalosis: Secondary | ICD-10-CM | POA: Diagnosis not present

## 2020-01-26 DIAGNOSIS — Z79899 Other long term (current) drug therapy: Secondary | ICD-10-CM

## 2020-01-26 DIAGNOSIS — R609 Edema, unspecified: Secondary | ICD-10-CM | POA: Diagnosis not present

## 2020-01-26 DIAGNOSIS — I493 Ventricular premature depolarization: Secondary | ICD-10-CM | POA: Diagnosis present

## 2020-01-26 DIAGNOSIS — I110 Hypertensive heart disease with heart failure: Secondary | ICD-10-CM | POA: Diagnosis not present

## 2020-01-26 DIAGNOSIS — Z6839 Body mass index (BMI) 39.0-39.9, adult: Secondary | ICD-10-CM

## 2020-01-26 DIAGNOSIS — I4891 Unspecified atrial fibrillation: Secondary | ICD-10-CM | POA: Diagnosis present

## 2020-01-26 DIAGNOSIS — K7460 Unspecified cirrhosis of liver: Secondary | ICD-10-CM | POA: Diagnosis present

## 2020-01-26 DIAGNOSIS — G4733 Obstructive sleep apnea (adult) (pediatric): Secondary | ICD-10-CM | POA: Diagnosis not present

## 2020-01-26 DIAGNOSIS — I50813 Acute on chronic right heart failure: Secondary | ICD-10-CM | POA: Diagnosis present

## 2020-01-26 DIAGNOSIS — G4700 Insomnia, unspecified: Secondary | ICD-10-CM | POA: Diagnosis present

## 2020-01-26 DIAGNOSIS — Z86718 Personal history of other venous thrombosis and embolism: Secondary | ICD-10-CM

## 2020-01-26 DIAGNOSIS — Z9119 Patient's noncompliance with other medical treatment and regimen: Secondary | ICD-10-CM

## 2020-01-26 DIAGNOSIS — Z86711 Personal history of pulmonary embolism: Secondary | ICD-10-CM

## 2020-01-26 DIAGNOSIS — R069 Unspecified abnormalities of breathing: Secondary | ICD-10-CM | POA: Diagnosis not present

## 2020-01-26 DIAGNOSIS — R0602 Shortness of breath: Secondary | ICD-10-CM | POA: Diagnosis not present

## 2020-01-26 DIAGNOSIS — D696 Thrombocytopenia, unspecified: Secondary | ICD-10-CM | POA: Diagnosis present

## 2020-01-26 DIAGNOSIS — I5043 Acute on chronic combined systolic (congestive) and diastolic (congestive) heart failure: Secondary | ICD-10-CM | POA: Diagnosis present

## 2020-01-26 DIAGNOSIS — K760 Fatty (change of) liver, not elsewhere classified: Secondary | ICD-10-CM | POA: Diagnosis present

## 2020-01-26 DIAGNOSIS — Z8249 Family history of ischemic heart disease and other diseases of the circulatory system: Secondary | ICD-10-CM

## 2020-01-26 DIAGNOSIS — Z7901 Long term (current) use of anticoagulants: Secondary | ICD-10-CM

## 2020-01-26 DIAGNOSIS — E871 Hypo-osmolality and hyponatremia: Secondary | ICD-10-CM | POA: Diagnosis not present

## 2020-01-26 DIAGNOSIS — Z20822 Contact with and (suspected) exposure to covid-19: Secondary | ICD-10-CM | POA: Diagnosis present

## 2020-01-26 DIAGNOSIS — E876 Hypokalemia: Secondary | ICD-10-CM | POA: Diagnosis not present

## 2020-01-26 DIAGNOSIS — Z83438 Family history of other disorder of lipoprotein metabolism and other lipidemia: Secondary | ICD-10-CM

## 2020-01-26 DIAGNOSIS — T502X5A Adverse effect of carbonic-anhydrase inhibitors, benzothiadiazides and other diuretics, initial encounter: Secondary | ICD-10-CM | POA: Diagnosis not present

## 2020-01-26 DIAGNOSIS — Z880 Allergy status to penicillin: Secondary | ICD-10-CM

## 2020-01-26 DIAGNOSIS — I2510 Atherosclerotic heart disease of native coronary artery without angina pectoris: Secondary | ICD-10-CM | POA: Diagnosis present

## 2020-01-26 LAB — CBC WITH DIFFERENTIAL/PLATELET
Abs Immature Granulocytes: 0.02 10*3/uL (ref 0.00–0.07)
Basophils Absolute: 0 10*3/uL (ref 0.0–0.1)
Basophils Relative: 0 %
Eosinophils Absolute: 0.1 10*3/uL (ref 0.0–0.5)
Eosinophils Relative: 2 %
HCT: 40 % (ref 39.0–52.0)
Hemoglobin: 11.7 g/dL — ABNORMAL LOW (ref 13.0–17.0)
Immature Granulocytes: 0 %
Lymphocytes Relative: 10 %
Lymphs Abs: 0.7 10*3/uL (ref 0.7–4.0)
MCH: 24.5 pg — ABNORMAL LOW (ref 26.0–34.0)
MCHC: 29.3 g/dL — ABNORMAL LOW (ref 30.0–36.0)
MCV: 83.7 fL (ref 80.0–100.0)
Monocytes Absolute: 0.6 10*3/uL (ref 0.1–1.0)
Monocytes Relative: 8 %
Neutro Abs: 6.1 10*3/uL (ref 1.7–7.7)
Neutrophils Relative %: 80 %
Platelets: 133 10*3/uL — ABNORMAL LOW (ref 150–400)
RBC: 4.78 MIL/uL (ref 4.22–5.81)
RDW: 16 % — ABNORMAL HIGH (ref 11.5–15.5)
WBC: 7.6 10*3/uL (ref 4.0–10.5)
nRBC: 0 % (ref 0.0–0.2)

## 2020-01-26 LAB — COMPREHENSIVE METABOLIC PANEL
ALT: 28 U/L (ref 0–44)
AST: 42 U/L — ABNORMAL HIGH (ref 15–41)
Albumin: 3.7 g/dL (ref 3.5–5.0)
Alkaline Phosphatase: 70 U/L (ref 38–126)
Anion gap: 11 (ref 5–15)
BUN: 12 mg/dL (ref 8–23)
CO2: 31 mmol/L (ref 22–32)
Calcium: 9.2 mg/dL (ref 8.9–10.3)
Chloride: 97 mmol/L — ABNORMAL LOW (ref 98–111)
Creatinine, Ser: 1.16 mg/dL (ref 0.61–1.24)
GFR, Estimated: >60 mL/min (ref >60)
Glucose, Bld: 112 mg/dL — ABNORMAL HIGH (ref 70–99)
Potassium: 3.6 mmol/L (ref 3.5–5.1)
Sodium: 139 mmol/L (ref 135–145)
Total Bilirubin: 0.8 mg/dL (ref 0.3–1.2)
Total Protein: 7.2 g/dL (ref 6.5–8.1)

## 2020-01-26 LAB — URINALYSIS, ROUTINE W REFLEX MICROSCOPIC
Bilirubin Urine: NEGATIVE
Glucose, UA: NEGATIVE mg/dL
Hgb urine dipstick: NEGATIVE
Ketones, ur: NEGATIVE mg/dL
Leukocytes,Ua: NEGATIVE
Nitrite: NEGATIVE
Protein, ur: NEGATIVE mg/dL
Specific Gravity, Urine: 1.016 (ref 1.005–1.030)
pH: 6 (ref 5.0–8.0)

## 2020-01-26 MED ORDER — FUROSEMIDE 10 MG/ML IJ SOLN
80.0000 mg | Freq: Once | INTRAMUSCULAR | Status: AC
  Administered 2020-01-27: 80 mg via INTRAVENOUS
  Filled 2020-01-26: qty 8

## 2020-01-26 NOTE — ED Provider Notes (Signed)
[facility] EMERGENCY DEPARTMENT Provider Note  CSN: [id number] Arrival date & time: 01/26/20 1558  Chief Complaint(s) Leg Swelling  HPI Charles Gray is a 64 y.o. male with a past medical history listed below including paroxysmal A. fib on Eliquis, chronic diastolic heart failure on torsemide and spironolactone who presents to the emergency department after being sent from the cardiology clinic for volume overload that has not improved on increased dose of his torsemide with a past 3 days.  Patient reports that he was started on diltiazem 1 to 2 weeks ago, and since then has had worsening peripheral edema.  Patient also endorsing a dyspnea on exertion after taking only a few steps.  He denies any recent fevers or infections.  No coughing or congestion.  No shortness of breath at rest.  He endorses intermittent chest pain has been going on for a while.  No nausea or vomiting.  No abdominal pain.  No other physical complaints.  HPI  Past Medical History Past Medical History:  Diagnosis Date  . Acute on chronic congestive heart failure ([place])   . Acute on chronic diastolic CHF (congestive heart failure) ([place]) 08/23/2019  . Acute on chronic right-sided heart failure ([place]) 08/06/2019  . Acute respiratory failure with hypoxia ([place])   . Anemia of chronic disease   . Atrial fibrillation ([place]) 12/23/2019  . Atrial tachycardia ([place]) 08/12/2019  . Benign essential HTN   . Chest pain 08/23/2019  . Chronic anticoagulation 12/26/2019  . Chronic heart failure with preserved EF 05/30/2019   Echo 07/2019: EF 55-60, GR 1 DD  . Chronic pain syndrome   . Cirrhosis of liver without ascites ([place])   . Class 2 severe obesity due to excess calories with serious comorbidity and body mass index (BMI) of 39.0 to 39.9 in adult ([place]) 05/30/2019  . Debility 03/13/2019  . Drug induced constipation   . Dyspnea   . Fatty liver disease, nonalcoholic   . GIB (gastrointestinal bleeding) 03/04/2019  .  Heart failure, systolic, acute ([place]) 05/05/4823  . History of pulmonary embolism 08/12/2019  . Hyperlipidemia 01/24/2019  . Hypertension 01/24/2019  . Hypoalbuminemia due to protein-calorie malnutrition ([place])   . Hypokalemia   . Hyponatremia   . Hypotension due to drugs   . Insomnia 01/24/2019  . Lactic acidosis 08/23/2019  . Lumbar disc herniation 01/24/2019  . Mixed hyperlipidemia 01/24/2019  . Morbid obesity with BMI of 40.0-44.9, adult ([place]) 05/30/2019  . Normocytic anemia 06/08/2019  . Occasional tremors 08/12/2019  . OSA (obstructive sleep apnea) 08/12/2019  . Palpitations 12/26/2019  . Peripheral edema   . Personal history of DVT (deep vein thrombosis) 12/26/2019  . Portal hypertensive gastropathy ([place]) 03/06/2019   Seen on EGD 03/06/2019  . Pulmonary emboli ([place]) 06/08/2019  . Pulmonary embolism ([place]) 06/08/2019  . Redness and swelling of lower leg 08/23/2019  . Shock ([place])   . Thrombocytopenia [facility])    Patient Active Problem List   Diagnosis Date Noted  . Acute respiratory failure with hypoxia ([place])   . Fatty liver disease, nonalcoholic   . Shock ([place])   . Personal history of DVT (deep vein thrombosis) 12/26/2019  . Chronic anticoagulation 12/26/2019  . Palpitations 12/26/2019  . Atrial fibrillation ([place]) 12/23/2019  . Heart failure, systolic, acute ([place]) 00/37/0488  . Acute on chronic congestive heart failure ([place])   . Acute on chronic diastolic CHF (congestive heart failure) ([place]) 08/23/2019  . Redness and swelling of lower leg 08/23/2019  . Chest pain 08/23/2019  .  Lactic acidosis 08/23/2019  . History of pulmonary embolism 08/12/2019  . Atrial tachycardia ([place]) 08/12/2019  . Occasional tremors 08/12/2019  . OSA (obstructive sleep apnea) 08/12/2019  . Acute on chronic right-sided heart failure ([place]) 08/06/2019  . Pulmonary embolism ([place]) 06/08/2019  . Normocytic anemia 06/08/2019  . Pulmonary emboli ([place]) 06/08/2019  . Chronic heart failure with preserved ejection  fraction ([place]) 05/30/2019  . Class 2 severe obesity due to excess calories with serious comorbidity and body mass index (BMI) of 39.0 to 39.9 in adult ([place]) 05/30/2019  . Morbid obesity with BMI of 40.0-44.9, adult ([place]) 05/30/2019  . Hyponatremia   . Benign essential HTN   . Drug induced constipation   . Peripheral edema   . Hypotension due to drugs   . Hypoalbuminemia due to protein-calorie malnutrition ([place])   . Hypokalemia   . Thrombocytopenia ([place])   . Anemia of chronic disease   . Cirrhosis of liver without ascites ([place])   . Chronic pain syndrome   . Debility 03/13/2019  . Portal hypertensive gastropathy ([place]) 03/06/2019  . Dyspnea   . GIB (gastrointestinal bleeding) 03/04/2019  . Mixed hyperlipidemia 01/24/2019  . Insomnia 01/24/2019  . Hyperlipidemia 01/24/2019  . Hypertension 01/24/2019  . Lumbar disc herniation 01/24/2019   Home Medication(s) Prior to Admission medications   Medication Sig Start Date End Date Taking? Authorizing Provider  acetaminophen (TYLENOL) 325 MG tablet Take 2 tablets (650 mg total) by mouth every 4 (four) hours as needed for fever or mild pain. 03/23/19   Angiulli, Lavon Paganini, PA-C  apixaban (ELIQUIS) 5 MG TABS tablet Take 1 tablet (5 mg total) by mouth 2 (two) times daily. 08/12/19   Strader, Fransisco Hertz, PA-C  Buprenorphine HCl-Naloxone HCl 8-2 MG FILM Place 1 tablet under the tongue in the morning and at bedtime.     [provider]  diphenhydrAMINE (BENADRYL) 25 MG tablet Take 25 mg by mouth every 6 (six) hours as needed for allergies.     [provider]  gabapentin (NEURONTIN) 800 MG tablet Take 1 tablet (800 mg total) by mouth 3 (three) times daily. 03/23/19   Angiulli, Lavon Paganini, PA-C  pantoprazole (PROTONIX) 40 MG tablet Take 40 mg by mouth 2 (two) times daily.    [provider]  polyethylene glycol (MIRALAX / GLYCOLAX) 17 g packet Take 17 g by mouth daily. Patient taking differently: Take 17 g by mouth daily as needed  for mild constipation.  03/23/19   Angiulli, Lavon Paganini, PA-C  pravastatin (PRAVACHOL) 20 MG tablet Take 1 tablet (20 mg total) by mouth daily. 03/23/19   Angiulli, Lavon Paganini, PA-C  spironolactone (ALDACTONE) 25 MG tablet Take 1 tablet (25 mg total) by mouth daily. 09/21/19   Tobb, Kardie, DO  torsemide (DEMADEX) 20 MG tablet Take 2 tablets (40 mg total) by mouth every morning AND 1 tablet (20 mg total) every evening. 09/21/19   Tobb, Kardie, DO  VENTOLIN HFA 108 (90 Base) MCG/ACT inhaler Inhale 1 puff into the lungs every 4 (four) hours as needed for shortness of breath. 03/23/19   Angiulli, Lavon Paganini, PA-C  zolpidem (AMBIEN) 10 MG tablet Take 10 mg by mouth at bedtime as needed for sleep.  05/19/19   [provider]  Past Surgical History Past Surgical History:  Procedure Laterality Date  . ESOPHAGOGASTRODUODENOSCOPY (EGD) WITH PROPOFOL N/A 03/05/2019   Procedure: ESOPHAGOGASTRODUODENOSCOPY (EGD) WITH PROPOFOL;  Surgeon: Doran Stabler, MD;  Location: [facility];  Service: Gastroenterology;  Laterality: N/A;  Large Blood Clot Removed  . ESOPHAGOGASTRODUODENOSCOPY (EGD) WITH PROPOFOL N/A 03/06/2019   Procedure: ESOPHAGOGASTRODUODENOSCOPY (EGD) WITH PROPOFOL;  Surgeon: Thornton Park, MD;  Location: [facility];  Service: Gastroenterology;  Laterality: N/A;  . HEMORROIDECTOMY    . RIGHT HEART CATH N/A 08/09/2019   Procedure: RIGHT HEART CATH;  Surgeon: Larey Dresser, MD;  Location: [facility];  Service: Cardiovascular;  Laterality: N/A;   Family History Family History  Problem Relation Age of Onset  . Hyperlipidemia Mother   . Hypertension Mother   . Stroke Mother   . CAD Maternal Grandmother   . CAD Maternal Grandfather     Social History Social History   Tobacco Use  . Smoking status: Never Smoker  . Smokeless tobacco: Never Used   Vaping Use  . Vaping Use: Never used  Substance Use Topics  . Alcohol use: Never  . Drug use: Not Currently    Types: Marijuana    Comment: Smoked for 30 years   Allergies Penicillins  Review of Systems Review of Systems All other systems are reviewed and are negative for acute change except as noted in the HPI  Physical Exam Vital Signs  I have reviewed the triage vital signs BP 112/70   Pulse 77   Temp 99 F (37.2 C) (Oral)   Resp 16   Ht 6' 10"  (2.083 m)   Wt (!) 170.1 kg   SpO2 96%   BMI 39.21 kg/m   Physical Exam Vitals reviewed.  Constitutional:      General: He is not in acute distress.    Appearance: He is well-developed. He is not diaphoretic.  HENT:     Head: Normocephalic and atraumatic.     Nose: Nose normal.  Eyes:     General: No scleral icterus.       Right eye: No discharge.        Left eye: No discharge.     Conjunctiva/sclera: Conjunctivae normal.     Pupils: Pupils are equal, round, and reactive to light.  Cardiovascular:     Rate and Rhythm: Normal rate and regular rhythm.     Heart sounds: No murmur heard.  No friction rub. No gallop.   Pulmonary:     Effort: Pulmonary effort is normal. No respiratory distress.     Breath sounds: Normal breath sounds. No stridor. No rales.  Abdominal:     General: There is no distension.     Palpations: Abdomen is soft.     Tenderness: There is no abdominal tenderness.  Musculoskeletal:        General: No tenderness.     Cervical back: Normal range of motion and neck supple.     Right lower leg: 2+ Pitting Edema present.     Left lower leg: 2+ Pitting Edema present.  Skin:    General: Skin is warm and dry.     Findings: No erythema or rash.  Neurological:     Mental Status: He is alert and oriented to person, place, and time.     ED Results and Treatments Labs (all labs ordered are listed, but only abnormal results are displayed) Labs Reviewed  CBC WITH DIFFERENTIAL/PLATELET - Abnormal;  Notable for the following components:  Result Value   Hemoglobin 11.7 (*)    MCH 24.5 (*)    MCHC 29.3 (*)    RDW 16.0 (*)    Platelets 133 (*)    All other components within normal limits  COMPREHENSIVE METABOLIC PANEL - Abnormal; Notable for the following components:   Chloride 97 (*)    Glucose, Bld 112 (*)    AST 42 (*)    All other components within normal limits  RESPIRATORY PANEL BY RT PCR (FLU A&B, COVID)  URINALYSIS, ROUTINE W REFLEX MICROSCOPIC                                                                                                                         EKG  EKG Interpretation  Date/Time:  Friday January 26 2020 16:00:11 EDT Ventricular Rate:  91 PR Interval:  164 QRS Duration: 100 QT Interval:  350 QTC Calculation: 430 R Axis:   -61 Text Interpretation: Normal sinus rhythm Low voltage QRS Left anterior fascicular block Abnormal ECG No acute changes Confirmed by Addison Lank [phone]) on 01/26/2020 11:14:22 PM       EKG Interpretation  Date/Time:  Friday January 26 2020 16:00:11 EDT Ventricular Rate:  91 PR Interval:  164 QRS Duration: 100 QT Interval:  350 QTC Calculation: 430 R Axis:   -61 Text Interpretation: Normal sinus rhythm Low voltage QRS Left anterior fascicular block Abnormal ECG No acute changes Confirmed by Addison Lank [phone]) on 01/26/2020 11:14:22 PM        Radiology DG Chest 2 View  Result Date: 01/26/2020 CLINICAL DATA:  Shortness of breath EXAM: CHEST - 2 VIEW COMPARISON:  09/25/2019 chest radiograph and prior. FINDINGS: Bibasilar opacities, likely atelectasis. No pneumothorax or pleural effusion. Cardiomediastinal silhouette within normal limits. No acute osseous abnormality. Mild multilevel spondylosis. IMPRESSION: Bibasilar opacities, likely atelectasis. Electronically Signed   By: Primitivo Gauze M.D.   On: 01/26/2020 16:41    Pertinent labs & imaging results that were available during my care of the patient were  reviewed by me and considered in my medical decision making (see chart for details).  Medications Ordered in ED Medications  furosemide (LASIX) injection 80 mg (has no administration in time range)                                                                                                                                    Procedures Procedures  (  including critical care time)  Medical Decision Making / ED Course I have reviewed the nursing notes for this encounter and the patient's prior records (if available in EHR or on provided paperwork).   Kamran Coker was evaluated in Emergency Department on 01/27/2020 for the symptoms described in the history of present illness. He was evaluated in the context of the global COVID-19 pandemic, which necessitated consideration that the patient might be at risk for infection with the SARS-CoV-2 virus that causes COVID-19. Institutional protocols and algorithms that pertain to the evaluation of patients at risk for COVID-19 are in a state of rapid change based on information released by regulatory bodies including the CDC and federal and state organizations. These policies and algorithms were followed during the patient's care in the ED.    Clinical Course as of Jan 27 16  Sat Jan 27, 2020  0013 Patient presents with volume overload and worsening peripheral edema that had failed outpatient management including increased diuretics.     [PC]  0014 EKG is reassuring without evidence of A. fib, no acute ischemic changes.  No dysrhythmias or blocks.   CXR with bibasilar atelectasis.  No overt pulmonary edema.  Labs are grossly reassuring without leukocytosis.  Hemoglobin stable.  Renal function intact.   [PC]  0014 Will place IV and start patient on IV diuretics.  Will discuss case with medicine for admission   [PC]    Clinical Course User Index [PC] Costa Jha, Grayce Sessions, MD     Final Clinical Impression(s) / ED Diagnoses Final  diagnoses:  Acute on chronic diastolic CHF (congestive heart failure) ([place])      This chart was dictated using voice recognition software.  Despite best efforts to proofread,  errors can occur which can change the documentation meaning.   Fatima Blank, MD 01/27/20 [phone]

## 2020-01-26 NOTE — Patient Instructions (Signed)
Please go to the emergency department   Medication Instructions:  Your physician recommends that you continue on your current medications as directed. Please refer to the Current Medication list given to you today.  *If you need a refill on your cardiac medications before your next appointment, please call your pharmacy*   Lab Work: None. If you have labs (blood work) drawn today and your tests are completely normal, you will receive your results only by: Marland Kitchen MyChart Message (if you have MyChart) OR . A paper copy in the mail If you have any lab test that is abnormal or we need to change your treatment, we will call you to review the results.   Testing/Procedures: None.   Follow-Up: At [facility], you and your health needs are our priority.  As part of our continuing mission to provide you with exceptional heart care, we have created designated Provider Care Teams.  These Care Teams include your primary Cardiologist (physician) and Advanced Practice Providers (APPs -  Physician Assistants and Nurse Practitioners) who all work together to provide you with the care you need, when you need it.  We recommend signing up for the patient portal called "MyChart".  Sign up information is provided on this After Visit Summary.  MyChart is used to connect with patients for Virtual Visits (Telemedicine).  Patients are able to view lab/test results, encounter notes, upcoming appointments, etc.  Non-urgent messages can be sent to your provider as well.   To learn more about what you can do with MyChart, go to [email or web address].

## 2020-01-26 NOTE — Progress Notes (Signed)
Cardiology Office Note:    Date:  01/26/2020   ID:  Axton Cihlar, DOB 1955-09-20, MRN [id number]  PCP:  Enid Skeens., MD  Cardiologist:  Berniece Salines, DO  Electrophysiologist:  None   Referring MD: Enid Skeens., MD   " I have been short of breath"  History of Present Illness:    Charles Gray is a 64 y.o. male with a hx of coronary artery disease seen with mild noncalcified plaque in RCA and LAD on coronary CTA done in July 4536, chronic diastolic heart failure, hypertension, pulmonary embolism on Eliquis and recently diagnosed atrial fibrillation with rapid ventricular rate on December 23, 2019.    I did see the patient on January 05, 2020 at that time I started him on Cardizem due to atrial fibrillation, unfortunately Cardizem caused significant leg swelling for the patient therefore we had to stop this medication.  We will also increase his torsemide to 40 mg twice a day and asked the patient to come for an evaluation.  He is here today in the office and he has not had any relief from the increase of his diuretics.  He has significant bilateral leg edema compared to her prior visits.And he has gained5 pounds.  Past Medical History:  Diagnosis Date  . Acute on chronic congestive heart failure ([place])   . Acute on chronic diastolic CHF (congestive heart failure) ([place]) 08/23/2019  . Acute on chronic right-sided heart failure ([place]) 08/06/2019  . Acute respiratory failure with hypoxia ([place])   . Anemia of chronic disease   . Atrial fibrillation ([place]) 12/23/2019  . Atrial tachycardia ([place]) 08/12/2019  . Benign essential HTN   . Chest pain 08/23/2019  . Chronic anticoagulation 12/26/2019  . Chronic heart failure with preserved EF 05/30/2019   Echo 07/2019: EF 55-60, GR 1 DD  . Chronic pain syndrome   . Cirrhosis of liver without ascites ([place])   . Class 2 severe obesity due to excess calories with serious comorbidity and body mass index (BMI) of 39.0 to 39.9 in adult ([place])  05/30/2019  . Debility 03/13/2019  . Drug induced constipation   . Dyspnea   . Fatty liver disease, nonalcoholic   . GIB (gastrointestinal bleeding) 03/04/2019  . Heart failure, systolic, acute ([place]) 4/68/0321  . History of pulmonary embolism 08/12/2019  . Hyperlipidemia 01/24/2019  . Hypertension 01/24/2019  . Hypoalbuminemia due to protein-calorie malnutrition ([place])   . Hypokalemia   . Hyponatremia   . Hypotension due to drugs   . Insomnia 01/24/2019  . Lactic acidosis 08/23/2019  . Lumbar disc herniation 01/24/2019  . Mixed hyperlipidemia 01/24/2019  . Morbid obesity with BMI of 40.0-44.9, adult ([place]) 05/30/2019  . Normocytic anemia 06/08/2019  . Occasional tremors 08/12/2019  . OSA (obstructive sleep apnea) 08/12/2019  . Palpitations 12/26/2019  . Peripheral edema   . Personal history of DVT (deep vein thrombosis) 12/26/2019  . Portal hypertensive gastropathy ([place]) 03/06/2019   Seen on EGD 03/06/2019  . Pulmonary emboli ([place]) 06/08/2019  . Pulmonary embolism ([place]) 06/08/2019  . Redness and swelling of lower leg 08/23/2019  . Shock ([place])   . Thrombocytopenia ([place])     Past Surgical History:  Procedure Laterality Date  . ESOPHAGOGASTRODUODENOSCOPY (EGD) WITH PROPOFOL N/A 03/05/2019   Procedure: ESOPHAGOGASTRODUODENOSCOPY (EGD) WITH PROPOFOL;  Surgeon: Doran Stabler, MD;  Location: [place];  Service: Gastroenterology;  Laterality: N/A;  Large Blood Clot Removed  . ESOPHAGOGASTRODUODENOSCOPY (EGD) WITH PROPOFOL N/A 03/06/2019   Procedure: ESOPHAGOGASTRODUODENOSCOPY (EGD)  WITH PROPOFOL;  Surgeon: Thornton Park, MD;  Location: [facility];  Service: Gastroenterology;  Laterality: N/A;  . HEMORROIDECTOMY    . RIGHT HEART CATH N/A 08/09/2019   Procedure: RIGHT HEART CATH;  Surgeon: Larey Dresser, MD;  Location: [facility];  Service: Cardiovascular;  Laterality: N/A;    Current Medications: Current Meds  Medication Sig  . acetaminophen (TYLENOL) 325 MG tablet Take  2 tablets (650 mg total) by mouth every 4 (four) hours as needed for fever or mild pain.  Marland Kitchen apixaban (ELIQUIS) 5 MG TABS tablet Take 1 tablet (5 mg total) by mouth 2 (two) times daily.  . Buprenorphine HCl-Naloxone HCl 8-2 MG FILM Place 1 tablet under the tongue in the morning and at bedtime.   . diphenhydrAMINE (BENADRYL) 25 MG tablet Take 25 mg by mouth every 6 (six) hours as needed for allergies.   Marland Kitchen gabapentin (NEURONTIN) 800 MG tablet Take 1 tablet (800 mg total) by mouth 3 (three) times daily.  . pantoprazole (PROTONIX) 40 MG tablet Take 40 mg by mouth 2 (two) times daily.  . polyethylene glycol (MIRALAX / GLYCOLAX) 17 g packet Take 17 g by mouth daily. (Patient taking differently: Take 17 g by mouth daily as needed for mild constipation. )  . pravastatin (PRAVACHOL) 20 MG tablet Take 1 tablet (20 mg total) by mouth daily.  Marland Kitchen spironolactone (ALDACTONE) 25 MG tablet Take 1 tablet (25 mg total) by mouth daily.  Marland Kitchen torsemide (DEMADEX) 20 MG tablet Take 2 tablets (40 mg total) by mouth every morning AND 1 tablet (20 mg total) every evening.  . VENTOLIN HFA 108 (90 Base) MCG/ACT inhaler Inhale 1 puff into the lungs every 4 (four) hours as needed for shortness of breath.  . zolpidem (AMBIEN) 10 MG tablet Take 10 mg by mouth at bedtime as needed for sleep.      Allergies:   Penicillins   Social History   Socioeconomic History  . Marital status: Unknown    Spouse name: Not on file  . Number of children: Not on file  . Years of education: Not on file  . Highest education level: Not on file  Occupational History  . Not on file  Tobacco Use  . Smoking status: Never Smoker  . Smokeless tobacco: Never Used  Vaping Use  . Vaping Use: Never used  Substance and Sexual Activity  . Alcohol use: Never  . Drug use: Not Currently    Types: Marijuana    Comment: Smoked for 30 years  . Sexual activity: Not on file  Other Topics Concern  . Not on file  Social History Narrative  . Not on file    Social Determinants of Health   Financial Resource Strain:   . Difficulty of Paying Living Expenses: Not on file  Food Insecurity:   . Worried About Charity fundraiser in the Last Year: Not on file  . Ran Out of Food in the Last Year: Not on file  Transportation Needs:   . Lack of Transportation (Medical): Not on file  . Lack of Transportation (Non-Medical): Not on file  Physical Activity:   . Days of Exercise per Week: Not on file  . Minutes of Exercise per Session: Not on file  Stress:   . Feeling of Stress : Not on file  Social Connections:   . Frequency of Communication with Friends and Family: Not on file  . Frequency of Social Gatherings with Friends and Family: Not on file  .  Attends Religious Services: Not on file  . Active Member of Clubs or Organizations: Not on file  . Attends Archivist Meetings: Not on file  . Marital Status: Not on file     Family History: The patient's family history includes CAD in his maternal grandfather and maternal grandmother; Hyperlipidemia in his mother; Hypertension in his mother; Stroke in his mother.  ROS:   Review of Systems  Constitution: Negative for decreased appetite, fever and weight gain.  HENT: Negative for congestion, ear discharge, hoarse voice and sore throat.   Eyes: Negative for discharge, redness, vision loss in right eye and visual halos.  Cardiovascular: Negative for chest pain, dyspnea on exertion, leg swelling, orthopnea and palpitations.  Respiratory: Reports shortness of breath.  Negative for cough, hemoptysis, and snoring.   Endocrine: Negative for heat intolerance and polyphagia.  Hematologic/Lymphatic: Negative for bleeding problem. Does not bruise/bleed easily.  Skin: Negative for flushing, nail changes, rash and suspicious lesions.  Musculoskeletal: Negative for arthritis, joint pain, muscle cramps, myalgias, neck pain and stiffness.  Gastrointestinal: Negative for abdominal pain, bowel  incontinence, diarrhea and excessive appetite.  Genitourinary: Negative for decreased libido, genital sores and incomplete emptying.  Neurological: Negative for brief paralysis, focal weakness, headaches and loss of balance.  Psychiatric/Behavioral: Negative for altered mental status, depression and suicidal ideas.  Allergic/Immunologic: Negative for HIV exposure and persistent infections.    EKGs/Labs/Other Studies Reviewed:    The following studies were reviewed today:   EKG: None today  Coronary CTA Aorta: Normal size.  No calcifications.  No dissection.  Aortic Valve:  Trileaflet.  No calcifications.  Coronary Arteries:  Normal coronary origin.  Right dominance.  RCA is a large dominant artery that gives rise to PDA and PLVB. The proximal RCA with stair-step artifact. There is a mild (25-49%) soft plaque in the mid to distal RCA. No plaque in the distal RCA.  Left main is a large artery that gives rise to LAD and LCX arteries.  LAD is a large vessel. There is a mild (25-49%) soft plaques in the mid portion of the LAD.  LCX is a non-dominant artery that gives rise to one large OM1 branch. There is no plaque.  Other findings:  Normal pulmonary vein drainage into the left atrium.  Normal left atrial appendage without a thrombus.  Normal size of the pulmonary artery.  IMPRESSION: 1. Coronary calcium score of 0. This was 0 percentile for age and sex matched control.  2. Normal coronary origin with right dominance.  3. Mild Coronary Artery Disease. CADRADS 2. This study will be sent for FFR.  2D echo 07/2019 IMPRESSIONS  1. Suboptimal image quality for diagnostic purposes.  2. Left ventricular ejection fraction, by estimation, is 55 to 60%. The left ventricle has normal function. Left ventricular endocardial border not optimally defined to evaluate regional wall motion. Left ventricular  diastolic parameters are consistent with Grade I diastolic  dysfunction (impaired relaxation).  3. Right ventricular systolic function was not well visualized. The right ventricular size is not well visualized.  4. The mitral valve is grossly normal. No evidence of mitral valve regurgitation. No evidence of mitral stenosis.  5. The aortic valve was not well visualized. Aortic valve regurgitation is not visualized. No aortic stenosis is present.   [place] 08/09/19 1. Normal filling pressures.  2. Normal PA pressure.  3. Normal cardiac output.    Recent Labs: 02/27/2019: NT-Pro BNP 37 12/23/2019: ALT 29; B Natriuretic Peptide 295.7; Magnesium 2.1; TSH 2.472  12/26/2019: BUN 16; Creatinine, Ser 0.90; Hemoglobin 11.5; Platelets 133; Potassium 3.9; Sodium 134  Recent Lipid Panel    Component Value Date/Time   CHOL 140 11/07/2019 1244   CHOL 128 01/24/2019 1121   TRIG 70 11/07/2019 1244   HDL 48 11/07/2019 1244   HDL 42 01/24/2019 1121   CHOLHDL 2.9 11/07/2019 1244   VLDL 14 11/07/2019 1244   LDLCALC 78 11/07/2019 1244   LDLCALC 64 01/24/2019 1121    Physical Exam:    VS:  BP (!) 158/82   Pulse 97   Ht 6' 10"  (2.083 m)   Wt (!) 381 lb 12.8 oz (173.2 kg)   SpO2 94%   BMI 39.92 kg/m     Wt Readings from Last 3 Encounters:  01/26/20 (!) 381 lb 12.8 oz (173.2 kg)  01/05/20 (!) 376 lb 6.4 oz (170.7 kg)  12/26/19 (!) 377 lb 14.4 oz (171.4 kg)     GEN: Well nourished, well developed in no acute distress HEENT: Normal NECK: No JVD; No carotid bruits LYMPHATICS: No lymphadenopathy CARDIAC: S1S2 noted,RRR, no murmurs, rubs, gallops RESPIRATORY:  Clear to auscultation without rales, wheezing or rhonchi  ABDOMEN: Soft, non-tender, non-distended, +bowel sounds, no guarding. EXTREMITIES: bilateral up to knee  +3 pitting edema, No cyanosis, no clubbing MUSCULOSKELETAL:  No deformity  SKIN: Warm and dry NEUROLOGIC:  Alert and oriented x 3, non-focal PSYCHIATRIC:  Normal affect, good insight  ASSESSMENT:    1. Benign essential HTN   2.  Chronic heart failure with preserved ejection fraction (HCC)   3. Paroxysmal atrial fibrillation ([place])   4. OSA (obstructive sleep apnea)    PLAN:     He is significantly volume overloaded increasing his diuretic did not help.  I do not believe even adding Zaroxolyn to his regimen at this time will help the patient with effective diuresis.  I think IV diuretic will be the best possible option here.  I did advise the patient he needs to go to the emergency room.  He prefers his son to take him.  I am hoping he gets admitted for a few days of IV diuretics.  Eventually I will have him see EP to consider possible ablation for his paroxysmal atrial fibrillation which also could be the contributing to worsening of his diastolic heart failure.   The patient is in agreement with the above plan. The patient left the office in stable condition.  His son will be taking him to the emergency department   Medication Adjustments/Labs and Tests Ordered: Current medicines are reviewed at length with the patient today.  Concerns regarding medicines are outlined above.  No orders of the defined types were placed in this encounter.  No orders of the defined types were placed in this encounter.   Patient Instructions  Please go to the emergency department   Medication Instructions:  Your physician recommends that you continue on your current medications as directed. Please refer to the Current Medication list given to you today.  *If you need a refill on your cardiac medications before your next appointment, please call your pharmacy*   Lab Work: None. If you have labs (blood work) drawn today and your tests are completely normal, you will receive your results only by: Marland Kitchen MyChart Message (if you have MyChart) OR . A paper copy in the mail If you have any lab test that is abnormal or we need to change your treatment, we will call you to review the  results.  Testing/Procedures: None.   Follow-Up: At [facility], you and your health needs are our priority.  As part of our continuing mission to provide you with exceptional heart care, we have created designated Provider Care Teams.  These Care Teams include your primary Cardiologist (physician) and Advanced Practice Providers (APPs -  Physician Assistants and Nurse Practitioners) who all work together to provide you with the care you need, when you need it.  We recommend signing up for the patient portal called "MyChart".  Sign up information is provided on this After Visit Summary.  MyChart is used to connect with patients for Virtual Visits (Telemedicine).  Patients are able to view lab/test results, encounter notes, upcoming appointments, etc.  Non-urgent messages can be sent to your provider as well.   To learn more about what you can do with MyChart, go to [email or web address].          Adopting a Healthy Lifestyle.  Know what a healthy weight is for you (roughly BMI <25) and aim to maintain this   Aim for 7+ servings of fruits and vegetables daily   65-80+ fluid ounces of water or unsweet tea for healthy kidneys   Limit to max 1 drink of alcohol per day; avoid smoking/tobacco   Limit animal fats in diet for cholesterol and heart health - choose grass fed whenever available   Avoid highly processed foods, and foods high in saturated/trans fats   Aim for low stress - take time to unwind and care for your mental health   Aim for 150 min of moderate intensity exercise weekly for heart health, and weights twice weekly for bone health   Aim for 7-9 hours of sleep daily   When it comes to diets, agreement about the perfect plan isnt easy to find, even among the experts. Experts at the [employer] developed an idea known as the Healthy Eating Plate. Just imagine a plate divided into logical, healthy portions.   The emphasis is on diet quality:    Load up on vegetables and fruits - one-half of your plate: Aim for color and variety, and remember that potatoes dont count.   Go for whole grains - one-quarter of your plate: Whole wheat, barley, wheat berries, quinoa, oats, brown rice, and foods made with them. If you want pasta, go with whole wheat pasta.   Protein power - one-quarter of your plate: Fish, chicken, beans, and nuts are all healthy, versatile protein sources. Limit red meat.   The diet, however, does go beyond the plate, offering a few other suggestions.   Use healthy plant oils, such as olive, canola, soy, corn, sunflower and peanut. Check the labels, and avoid partially hydrogenated oil, which have unhealthy trans fats.   If youre thirsty, drink water. Coffee and tea are good in moderation, but skip sugary drinks and limit milk and dairy products to one or two daily servings.   The type of carbohydrate in the diet is more important than the amount. Some sources of carbohydrates, such as vegetables, fruits, whole grains, and beans-are healthier than others.   Finally, stay active  Signed, Berniece Salines, DO  01/26/2020 2:10 PM    [facility]

## 2020-01-26 NOTE — ED Triage Notes (Addendum)
Pt to ED via [facility] with c/o edema in lower ext. With blisters   Pt st's his fluid pills are not working.  Pt also st's he feels SOB at times and has chest pain off and on  Pt was seen by his MD earlier today and sent to ED for further work up

## 2020-01-27 DIAGNOSIS — I959 Hypotension, unspecified: Secondary | ICD-10-CM | POA: Diagnosis not present

## 2020-01-27 DIAGNOSIS — G4700 Insomnia, unspecified: Secondary | ICD-10-CM | POA: Diagnosis present

## 2020-01-27 DIAGNOSIS — G894 Chronic pain syndrome: Secondary | ICD-10-CM | POA: Diagnosis present

## 2020-01-27 DIAGNOSIS — Z7901 Long term (current) use of anticoagulants: Secondary | ICD-10-CM | POA: Diagnosis not present

## 2020-01-27 DIAGNOSIS — E876 Hypokalemia: Secondary | ICD-10-CM | POA: Diagnosis not present

## 2020-01-27 DIAGNOSIS — I2510 Atherosclerotic heart disease of native coronary artery without angina pectoris: Secondary | ICD-10-CM | POA: Diagnosis present

## 2020-01-27 DIAGNOSIS — Z86711 Personal history of pulmonary embolism: Secondary | ICD-10-CM | POA: Diagnosis not present

## 2020-01-27 DIAGNOSIS — T502X5A Adverse effect of carbonic-anhydrase inhibitors, benzothiadiazides and other diuretics, initial encounter: Secondary | ICD-10-CM | POA: Diagnosis not present

## 2020-01-27 DIAGNOSIS — E871 Hypo-osmolality and hyponatremia: Secondary | ICD-10-CM | POA: Diagnosis not present

## 2020-01-27 DIAGNOSIS — G4733 Obstructive sleep apnea (adult) (pediatric): Secondary | ICD-10-CM | POA: Diagnosis present

## 2020-01-27 DIAGNOSIS — I50813 Acute on chronic right heart failure: Secondary | ICD-10-CM | POA: Diagnosis not present

## 2020-01-27 DIAGNOSIS — R0989 Other specified symptoms and signs involving the circulatory and respiratory systems: Secondary | ICD-10-CM | POA: Diagnosis not present

## 2020-01-27 DIAGNOSIS — D696 Thrombocytopenia, unspecified: Secondary | ICD-10-CM | POA: Diagnosis present

## 2020-01-27 DIAGNOSIS — I493 Ventricular premature depolarization: Secondary | ICD-10-CM | POA: Diagnosis present

## 2020-01-27 DIAGNOSIS — I872 Venous insufficiency (chronic) (peripheral): Secondary | ICD-10-CM | POA: Diagnosis not present

## 2020-01-27 DIAGNOSIS — I480 Paroxysmal atrial fibrillation: Secondary | ICD-10-CM | POA: Diagnosis present

## 2020-01-27 DIAGNOSIS — E873 Alkalosis: Secondary | ICD-10-CM | POA: Diagnosis not present

## 2020-01-27 DIAGNOSIS — I5043 Acute on chronic combined systolic (congestive) and diastolic (congestive) heart failure: Secondary | ICD-10-CM | POA: Diagnosis present

## 2020-01-27 DIAGNOSIS — K7460 Unspecified cirrhosis of liver: Secondary | ICD-10-CM | POA: Diagnosis present

## 2020-01-27 DIAGNOSIS — Z20822 Contact with and (suspected) exposure to covid-19: Secondary | ICD-10-CM | POA: Diagnosis present

## 2020-01-27 DIAGNOSIS — K760 Fatty (change of) liver, not elsewhere classified: Secondary | ICD-10-CM | POA: Diagnosis present

## 2020-01-27 DIAGNOSIS — E782 Mixed hyperlipidemia: Secondary | ICD-10-CM | POA: Diagnosis present

## 2020-01-27 DIAGNOSIS — I5082 Biventricular heart failure: Secondary | ICD-10-CM | POA: Diagnosis present

## 2020-01-27 DIAGNOSIS — I10 Essential (primary) hypertension: Secondary | ICD-10-CM | POA: Diagnosis not present

## 2020-01-27 DIAGNOSIS — I110 Hypertensive heart disease with heart failure: Secondary | ICD-10-CM | POA: Diagnosis present

## 2020-01-27 DIAGNOSIS — K5900 Constipation, unspecified: Secondary | ICD-10-CM | POA: Diagnosis not present

## 2020-01-27 DIAGNOSIS — G629 Polyneuropathy, unspecified: Secondary | ICD-10-CM | POA: Diagnosis present

## 2020-01-27 DIAGNOSIS — I5033 Acute on chronic diastolic (congestive) heart failure: Secondary | ICD-10-CM | POA: Diagnosis present

## 2020-01-27 LAB — BRAIN NATRIURETIC PEPTIDE: B Natriuretic Peptide: 24.1 pg/mL (ref 0.0–100.0)

## 2020-01-27 LAB — MRSA PCR SCREENING: MRSA by PCR: NEGATIVE

## 2020-01-27 LAB — RESPIRATORY PANEL BY RT PCR (FLU A&B, COVID)
Influenza A by PCR: NEGATIVE
Influenza B by PCR: NEGATIVE
SARS Coronavirus 2 by RT PCR: NEGATIVE

## 2020-01-27 MED ORDER — PANTOPRAZOLE SODIUM 40 MG PO TBEC
40.0000 mg | DELAYED_RELEASE_TABLET | Freq: Two times a day (BID) | ORAL | Status: DC
  Administered 2020-01-27 – 2020-02-09 (×27): 40 mg via ORAL
  Filled 2020-01-27 (×24): qty 1

## 2020-01-27 MED ORDER — PRAVASTATIN SODIUM 40 MG PO TABS
20.0000 mg | ORAL_TABLET | Freq: Every day | ORAL | Status: DC
  Administered 2020-01-27 – 2020-01-29 (×3): 20 mg via ORAL
  Filled 2020-01-27 (×3): qty 1

## 2020-01-27 MED ORDER — APIXABAN 5 MG PO TABS
5.0000 mg | ORAL_TABLET | Freq: Two times a day (BID) | ORAL | Status: DC
  Administered 2020-01-27 – 2020-02-09 (×28): 5 mg via ORAL
  Filled 2020-01-27 (×28): qty 1

## 2020-01-27 MED ORDER — SODIUM CHLORIDE 0.9% FLUSH
3.0000 mL | Freq: Two times a day (BID) | INTRAVENOUS | Status: DC
  Administered 2020-01-27 – 2020-02-08 (×25): 3 mL via INTRAVENOUS

## 2020-01-27 MED ORDER — ZOLPIDEM TARTRATE 5 MG PO TABS
10.0000 mg | ORAL_TABLET | Freq: Every evening | ORAL | Status: DC | PRN
  Administered 2020-01-27 – 2020-02-08 (×14): 10 mg via ORAL
  Filled 2020-01-27 (×14): qty 2

## 2020-01-27 MED ORDER — ACETAMINOPHEN 325 MG PO TABS
650.0000 mg | ORAL_TABLET | ORAL | Status: DC | PRN
  Administered 2020-01-27 – 2020-02-08 (×9): 650 mg via ORAL
  Filled 2020-01-27 (×9): qty 2

## 2020-01-27 MED ORDER — GABAPENTIN 400 MG PO CAPS
800.0000 mg | ORAL_CAPSULE | Freq: Three times a day (TID) | ORAL | Status: DC
  Administered 2020-01-27 – 2020-02-09 (×41): 800 mg via ORAL
  Filled 2020-01-27 (×18): qty 2
  Filled 2020-01-27: qty 8
  Filled 2020-01-27 (×22): qty 2

## 2020-01-27 MED ORDER — ONDANSETRON HCL 4 MG/2ML IJ SOLN
4.0000 mg | Freq: Four times a day (QID) | INTRAMUSCULAR | Status: DC | PRN
  Administered 2020-02-05: 4 mg via INTRAVENOUS
  Filled 2020-01-27: qty 2

## 2020-01-27 MED ORDER — SPIRONOLACTONE 25 MG PO TABS
25.0000 mg | ORAL_TABLET | Freq: Every day | ORAL | Status: DC
  Administered 2020-01-27 – 2020-01-30 (×4): 25 mg via ORAL
  Filled 2020-01-27 (×4): qty 1

## 2020-01-27 MED ORDER — SODIUM CHLORIDE 0.9 % IV SOLN
250.0000 mL | INTRAVENOUS | Status: DC | PRN
  Administered 2020-02-04: 250 mL via INTRAVENOUS

## 2020-01-27 MED ORDER — APIXABAN 5 MG PO TABS: 5.0000 mg | ORAL_TABLET | Freq: Two times a day (BID) | ORAL | Status: DC

## 2020-01-27 MED ORDER — FUROSEMIDE 10 MG/ML IJ SOLN
40.0000 mg | Freq: Two times a day (BID) | INTRAMUSCULAR | Status: DC
  Administered 2020-01-27: 40 mg via INTRAVENOUS
  Filled 2020-01-27: qty 4

## 2020-01-27 MED ORDER — POLYETHYLENE GLYCOL 3350 17 G PO PACK
17.0000 g | PACK | Freq: Every day | ORAL | Status: DC | PRN
  Administered 2020-01-28: 17 g via ORAL
  Filled 2020-01-27 (×2): qty 1

## 2020-01-27 MED ORDER — METOLAZONE 2.5 MG PO TABS
2.5000 mg | ORAL_TABLET | Freq: Once | ORAL | Status: AC
  Administered 2020-01-27: 2.5 mg via ORAL
  Filled 2020-01-27: qty 1

## 2020-01-27 MED ORDER — SODIUM CHLORIDE 0.9% FLUSH: 3.0000 mL | INTRAVENOUS | Status: DC | PRN

## 2020-01-27 MED ORDER — FUROSEMIDE 10 MG/ML IJ SOLN
80.0000 mg | Freq: Two times a day (BID) | INTRAMUSCULAR | Status: DC
  Administered 2020-01-27 – 2020-01-29 (×5): 80 mg via INTRAVENOUS
  Filled 2020-01-27 (×6): qty 8

## 2020-01-27 MED ORDER — BUPRENORPHINE HCL-NALOXONE HCL 8-2 MG SL SUBL
1.0000 | SUBLINGUAL_TABLET | Freq: Two times a day (BID) | SUBLINGUAL | Status: DC
  Administered 2020-01-27 – 2020-02-09 (×27): 1 via SUBLINGUAL
  Filled 2020-01-27 (×27): qty 1

## 2020-01-27 NOTE — H&P (Signed)
History and Physical    Charles Gray TGP:[id number] DOB: 07/14/1955 DOA: 01/26/2020  PCP: Enid Skeens., MD  Patient coming from: Home  I have personally briefly reviewed patient's old medical records in [place]  Chief Complaint: Leg swelling  HPI: Charles Gray is a 64 y.o. male with medical history significant of PAF on eliquis, chronic R sided CHF currently on torsemide and aldactone.  Pt has been having worsening peripheral edema since being started on cardizem 1-2 weeks ago.  3 days ago cards increased his torsemide; however, despite this edema has continued to worsen and UOP has started to decrease.  No cough, congestion.  No SOB at rest, does have DOE, does have intermittent CP with activity, no N/V, no abd pain.  No fevers.  Saw cards clinic again today who sent him in to the ED for admission for IV diuresis (see clinic notes).   ED Course: lab work reveals creat 1.1.  CXR with bibasilar opacities, possibly just actelectasis.  Nl WBC.  Pt given 16m IV lasix.   Review of Systems: As per HPI, otherwise all review of systems negative.  Past Medical History:  Diagnosis Date  . Acute on chronic congestive heart failure (H[place]   . Acute on chronic diastolic CHF (congestive heart failure) (HY[place] 08/23/2019  . Acute on chronic right-sided heart failure (HR[place]ardo 08/06/2019  . Acute respiratory failure with hypoxia (HM[place]   . Anemia of chronic disease   . Atrial fibrillation (H[place] 12/23/2019  . Atrial tachycardia (H[place] 08/12/2019  . Benign essential HTN   . Chest pain 08/23/2019  . Chronic anticoagulation 12/26/2019  . Chronic heart failure with preserved EF 05/30/2019   Echo 07/2019: EF 55-60, GR 1 DD  . Chronic pain syndrome   . Cirrhosis of liver without ascites (HHallowell   . Class 2 severe obesity due to excess calories with serious comorbidity and body mass index (BMI) of 39.0 to 39.9 in adult (HEure[place] 05/30/2019  . Debility 03/13/2019  . Drug induced constipation    . Dyspnea   . Fatty liver disease, nonalcoholic   . GIB (gastrointestinal bleeding) 03/04/2019  . Heart failure, systolic, acute (HHoopeston 53/12/4074 . History of pulmonary embolism 08/12/2019  . Hyperlipidemia 01/24/2019  . Hypertension 01/24/2019  . Hypoalbuminemia due to protein-calorie malnutrition (HCl[place]   . Hypokalemia   . Hyponatremia   . Hypotension due to drugs   . Insomnia 01/24/2019  . Lactic acidosis 08/23/2019  . Lumbar disc herniation 01/24/2019  . Mixed hyperlipidemia 01/24/2019  . Morbid obesity with BMI of 40.0-44.9, adult (HAt[place] 05/30/2019  . Normocytic anemia 06/08/2019  . Occasional tremors 08/12/2019  . OSA (obstructive sleep apnea) 08/12/2019  . Palpitations 12/26/2019  . Peripheral edema   . Personal history of DVT (deep vein thrombosis) 12/26/2019  . Portal hypertensive gastropathy (HGarysburg 03/06/2019   Seen on EGD 03/06/2019  . Pulmonary emboli (HTaconite 06/08/2019  . Pulmonary embolism (HMarysville 06/08/2019  . Redness and swelling of lower leg 08/23/2019  . Shock (HPleasureville   . Thrombocytopenia (HMillers[place]     Past Surgical History:  Procedure Laterality Date  . ESOPHAGOGASTRODUODENOSCOPY (EGD) WITH PROPOFOL N/A 03/05/2019   Procedure: ESOPHAGOGASTRODUODENOSCOPY (EGD) WITH PROPOFOL;  Surgeon: DDoran Stabler MD;  [place] [facility] [place]  Service: Gastroenterology;  Laterality: N/A;  Large Blood Clot Removed  . ESOPHAGOGASTRODUODENOSCOPY (EGD) WITH PROPOFOL N/A 03/06/2019   Procedure: ESOPHAGOGASTRODUODENOSCOPY (EGD) WITH PROPOFOL;  Surgeon: BThornton Park MD;  Location[place] [facility]  Service: Gastroenterology;  Laterality: N/A;  .  HEMORROIDECTOMY    . RIGHT HEART CATH N/A 08/09/2019   Procedure: RIGHT HEART CATH;  Surgeon: Larey Dresser, MD;  Location: [facility];  Service: Cardiovascular;  Laterality: N/A;     reports that he has never smoked. He has never used smokeless tobacco. He reports previous drug use. Drug: Marijuana. He reports that he does not drink  alcohol.  Allergies  Allergen Reactions  . Penicillins     Did it involve swelling of the face/tongue/throat, SOB, or low BP? N/A Did it involve sudden or severe rash/hives, skin peeling, or any reaction on the inside of your mouth or nose? N/A Did you need to seek medical attention at a hospital or doctor's office?N/A When did it last happen?N/A If all above answers are "NO", may proceed with cephalosporin use.    Family History  Problem Relation Age of Onset  . Hyperlipidemia Mother   . Hypertension Mother   . Stroke Mother   . CAD Maternal Grandmother   . CAD Maternal Grandfather      Prior to Admission medications   Medication Sig Start Date End Date Taking? Authorizing Provider  acetaminophen (TYLENOL) 325 MG tablet Take 2 tablets (650 mg total) by mouth every 4 (four) hours as needed for fever or mild pain. 03/23/19   Angiulli, Lavon Paganini, PA-C  apixaban (ELIQUIS) 5 MG TABS tablet Take 1 tablet (5 mg total) by mouth 2 (two) times daily. 08/12/19   Strader, Fransisco Hertz, PA-C  Buprenorphine HCl-Naloxone HCl 8-2 MG FILM Place 1 tablet under the tongue in the morning and at bedtime.     [provider]  diphenhydrAMINE (BENADRYL) 25 MG tablet Take 25 mg by mouth every 6 (six) hours as needed for allergies.     [provider]  gabapentin (NEURONTIN) 800 MG tablet Take 1 tablet (800 mg total) by mouth 3 (three) times daily. 03/23/19   Angiulli, Lavon Paganini, PA-C  pantoprazole (PROTONIX) 40 MG tablet Take 40 mg by mouth 2 (two) times daily.    [provider]  polyethylene glycol (MIRALAX / GLYCOLAX) 17 g packet Take 17 g by mouth daily. Patient taking differently: Take 17 g by mouth daily as needed for mild constipation.  03/23/19   Angiulli, Lavon Paganini, PA-C  pravastatin (PRAVACHOL) 20 MG tablet Take 1 tablet (20 mg total) by mouth daily. 03/23/19   Angiulli, Lavon Paganini, PA-C  spironolactone (ALDACTONE) 25 MG tablet Take 1 tablet (25 mg total) by mouth daily.  09/21/19   Tobb, Kardie, DO  torsemide (DEMADEX) 20 MG tablet Take 2 tablets (40 mg total) by mouth every morning AND 1 tablet (20 mg total) every evening. 09/21/19   Tobb, Kardie, DO  VENTOLIN HFA 108 (90 Base) MCG/ACT inhaler Inhale 1 puff into the lungs every 4 (four) hours as needed for shortness of breath. 03/23/19   Angiulli, Lavon Paganini, PA-C  zolpidem (AMBIEN) 10 MG tablet Take 10 mg by mouth at bedtime as needed for sleep.  05/19/19   [provider]    Physical Exam: Vitals:   01/26/20 2300 01/26/20 2315 01/26/20 2330 01/26/20 2345  BP: 99/67 101/61 107/61 97/61  Pulse: 76 77 72 73  Resp: 10 13 13 13   Temp:      TempSrc:      SpO2: 96% 94% 93% 93%  Weight:      Height:        Constitutional: NAD, calm, comfortable Eyes: PERRL, lids and conjunctivae normal ENMT: Mucous membranes are  moist. Posterior pharynx clear of any exudate or lesions.Normal dentition.  Neck: normal, supple, no masses, no thyromegaly Respiratory: clear to auscultation bilaterally, no wheezing, no crackles. Normal respiratory effort. No accessory muscle use.  Cardiovascular: Regular rate and rhythm, no murmurs / rubs / gallops. 3-4+ BLE pitting edema, extends up into abdomen. 2+ pedal pulses. No carotid bruits.  Abdomen: no tenderness, no masses palpated. No hepatosplenomegaly. Bowel sounds positive.  Musculoskeletal: no clubbing / cyanosis. No joint deformity upper and lower extremities. Good ROM, no contractures. Normal muscle tone.  Skin: no rashes, lesions, ulcers. No induration Neurologic: CN 2-12 grossly intact. Sensation intact, DTR normal. Strength 5/5 in all 4.  Psychiatric: Normal judgment and insight. Alert and oriented x 3. Normal mood.    Labs on Admission: I have personally reviewed following labs and imaging studies  CBC: Recent Labs  Lab 01/26/20 1612  WBC 7.6  NEUTROABS 6.1  HGB 11.7*  HCT 40.0  MCV 83.7  PLT 397*   Basic Metabolic Panel: Recent Labs  Lab 01/26/20 1612   NA 139  K 3.6  CL 97*  CO2 31  GLUCOSE 112*  BUN 12  CREATININE 1.16  CALCIUM 9.2   GFR: Estimated Creatinine Clearance: 116.8 mL/min (by C-G formula based on SCr of 1.16 mg/dL). Liver Function Tests: Recent Labs  Lab 01/26/20 1612  AST 42*  ALT 28  ALKPHOS 70  BILITOT 0.8  PROT 7.2  ALBUMIN 3.7   No results for input(s): LIPASE, AMYLASE in the last 168 hours. No results for input(s): AMMONIA in the last 168 hours. Coagulation Profile: No results for input(s): INR, PROTIME in the last 168 hours. Cardiac Enzymes: No results for input(s): CKTOTAL, CKMB, CKMBINDEX, TROPONINI in the last 168 hours. BNP (last 3 results) Recent Labs    02/27/19 1106  PROBNP 37   HbA1C: No results for input(s): HGBA1C in the last 72 hours. CBG: No results for input(s): GLUCAP in the last 168 hours. Lipid Profile: No results for input(s): CHOL, HDL, LDLCALC, TRIG, CHOLHDL, LDLDIRECT in the last 72 hours. Thyroid Function Tests: No results for input(s): TSH, T4TOTAL, FREET4, T3FREE, THYROIDAB in the last 72 hours. Anemia Panel: No results for input(s): VITAMINB12, FOLATE, FERRITIN, TIBC, IRON, RETICCTPCT in the last 72 hours. Urine analysis:    Component Value Date/Time   COLORURINE YELLOW 01/26/2020 1844   APPEARANCEUR CLEAR 01/26/2020 1844   LABSPEC 1.016 01/26/2020 1844   PHURINE 6.0 01/26/2020 1844   GLUCOSEU NEGATIVE 01/26/2020 1844   HGBUR NEGATIVE 01/26/2020 1844   BILIRUBINUR NEGATIVE 01/26/2020 1844   KETONESUR NEGATIVE 01/26/2020 1844   PROTEINUR NEGATIVE 01/26/2020 1844   NITRITE NEGATIVE 01/26/2020 1844   LEUKOCYTESUR NEGATIVE 01/26/2020 1844    Radiological Exams on Admission: DG Chest 2 View  Result Date: 01/26/2020 CLINICAL DATA:  Shortness of breath EXAM: CHEST - 2 VIEW COMPARISON:  09/25/2019 chest radiograph and prior. FINDINGS: Bibasilar opacities, likely atelectasis. No pneumothorax or pleural effusion. Cardiomediastinal silhouette within normal limits. No  acute osseous abnormality. Mild multilevel spondylosis. IMPRESSION: Bibasilar opacities, likely atelectasis. Electronically Signed   By: Primitivo Gauze M.D.   On: 01/26/2020 16:41    EKG: Independently reviewed.  Assessment/Plan Principal Problem:   Acute on chronic right-sided heart failure (HCC) Active Problems:   Benign essential HTN   Atrial fibrillation ([place])    1. Acute on chronic R sided CHF - 1. CHF pathway 2. Lasix: 45m IV in ED then 438mIV BID for the moment 3. Cont home aldactone 4.  Not sure why hes not on ACEi but dont want to start this if cards hasnt 5. Last echo was in April: preserved EF, grade 1 DD.  Couldn't see R side of heart very well on echo 6. RHC done in April interestingly showed normal filling pressures, PA pressure, and cardiac output. 1. But: No protein in urine, and albumin of 3.7: dont think its kidney or liver causing his edema. 2. And cardiolgist DOES feel he has R sided CHF as per their office notes. 3. Looks like they think PAF is contributing to worsening of diastolic CHF from office note, planning on EP evaluation for possible ablation as outpt. 2. HTN - 1. Cont home BP meds when med rec completed 3. A.Fib - 1. Cont cardizem when med rec completed 2. Cont eliquis  DVT prophylaxis: Eliquis Code Status: Full Family Communication: No family in room Disposition Plan: Home after diuresis Consults called: None Admission status: Place in obs    Lesette Frary, [place] [facility]  How to contact the [facility] Attending or Consulting provider [place] or covering provider during after hours [place], for this patient?  1. Check the care team in [facility] and look for a) attending/consulting [facility] provider listed and b) the [facility] team listed 2. Log into [email or web address]  Amion Physician Scheduling and messaging for groups and whole hospitals  On call and physician scheduling software for group practices, residents, hospitalists and other medical providers  for call, clinic, rotation and shift schedules. On[employer] is a hospital-wide system for scheduling doctors and paging doctors on call. EasyPlot is for scientific plotting and data analysis.  [email or web address]  and use [place]'s universal password to access. If you do not have the password, please contact the hospital operator.  3. Locate the [facility] provider you are looking for under [facility] and page to a number that you can be directly reached. 4. If you still have difficulty reaching the provider, please page the [facility] (Director on Call) for the Hospitalists listed on amion for assistance.  01/27/2020, 1:16 AM

## 2020-01-27 NOTE — Consult Note (Signed)
CONSULTATION NOTE   Patient Name: Thatcher Doberstein Date of Encounter: 01/27/2020 Cardiologist: Berniece Salines, DO  Chief Complaint   Shortness of breath, leg edema  Patient Profile   64 yo male with history of chronic right heart failure, failed diuretics at home, referred for IV diuretics and further evaluation by Dr. Harriet Masson  HPI   Edwards Mckelvie is a 63 y.o. male who is being seen today for the evaluation of dyspnea, edema at the request of Dr. Cyndia Skeeters. This is a pleasant 64 year old male who was referred up from [facility] after seeing Dr. Harriet Masson yesterday.  He has a history of coronary artery disease with mild plaque in the RCA and LAD by coronary CTA in 3474, chronic diastolic heart failure with predominant right heart failure, hypertension, history of pulmonary embolism on Eliquis and recent atrial fibrillation.  He was started on Cardizem however he had significant swelling and did not tolerate it.  He continued to have worsening edema and has failed increases in his outpatient diuretics.  He was noted to have gained 5 pounds in the office yesterday.  He was also complaining of weeping edema of his legs.  Echo in April 2021 showed normal LVEF with one diastolic dysfunction however the RV was poorly visualized.  He subsequently underwent right heart cath which demonstrated normal filling pressures, normal PA pressures and normal cardiac output, after aggressive diuresis.  He was discharged in May on twice daily furosemide and metolazone as needed.  Had 2 more admissions since then, the most recent was in September.  His when he was diagnosed with new atrial fibrillation.  PMHx   Past Medical History:  Diagnosis Date  . Acute on chronic congestive heart failure ([place])   . Acute on chronic diastolic CHF (congestive heart failure) ([place]) 08/23/2019  . Acute on chronic right-sided heart failure ([place]) 08/06/2019  . Acute respiratory failure with hypoxia ([place])   . Anemia of chronic disease   . Atrial  fibrillation ([place]) 12/23/2019  . Atrial tachycardia ([place]) 08/12/2019  . Benign essential HTN   . Chest pain 08/23/2019  . Chronic anticoagulation 12/26/2019  . Chronic heart failure with preserved EF 05/30/2019   Echo 07/2019: EF 55-60, GR 1 DD  . Chronic pain syndrome   . Cirrhosis of liver without ascites ([place])   . Class 2 severe obesity due to excess calories with serious comorbidity and body mass index (BMI) of 39.0 to 39.9 in adult ([place]) 05/30/2019  . Debility 03/13/2019  . Drug induced constipation   . Dyspnea   . Fatty liver disease, nonalcoholic   . GIB (gastrointestinal bleeding) 03/04/2019  . Heart failure, systolic, acute ([place]) 2/59/5638  . History of pulmonary embolism 08/12/2019  . Hyperlipidemia 01/24/2019  . Hypertension 01/24/2019  . Hypoalbuminemia due to protein-calorie malnutrition ([place])   . Hypokalemia   . Hyponatremia   . Hypotension due to drugs   . Insomnia 01/24/2019  . Lactic acidosis 08/23/2019  . Lumbar disc herniation 01/24/2019  . Mixed hyperlipidemia 01/24/2019  . Morbid obesity with BMI of 40.0-44.9, adult ([place]) 05/30/2019  . Normocytic anemia 06/08/2019  . Occasional tremors 08/12/2019  . OSA (obstructive sleep apnea) 08/12/2019  . Palpitations 12/26/2019  . Peripheral edema   . Personal history of DVT (deep vein thrombosis) 12/26/2019  . Portal hypertensive gastropathy ([place]) 03/06/2019   Seen on EGD 03/06/2019  . Pulmonary emboli ([place]) 06/08/2019  . Pulmonary embolism ([place]) 06/08/2019  . Redness and swelling of lower leg 08/23/2019  . Shock ([place])   .  Thrombocytopenia ([place])     Past Surgical History:  Procedure Laterality Date  . ESOPHAGOGASTRODUODENOSCOPY (EGD) WITH PROPOFOL N/A 03/05/2019   Procedure: ESOPHAGOGASTRODUODENOSCOPY (EGD) WITH PROPOFOL;  Surgeon: Doran Stabler, MD;  Location: [place];  Service: Gastroenterology;  Laterality: N/A;  Large Blood Clot Removed  . ESOPHAGOGASTRODUODENOSCOPY (EGD) WITH PROPOFOL N/A 03/06/2019   Procedure:  ESOPHAGOGASTRODUODENOSCOPY (EGD) WITH PROPOFOL;  Surgeon: Thornton Park, MD;  Location: [place];  Service: Gastroenterology;  Laterality: N/A;  . HEMORROIDECTOMY    . RIGHT HEART CATH N/A 08/09/2019   Procedure: RIGHT HEART CATH;  Surgeon: Larey Dresser, MD;  Location: [facility];  Service: Cardiovascular;  Laterality: N/A;    FAMHx   Family History  Problem Relation Age of Onset  . Hyperlipidemia Mother   . Hypertension Mother   . Stroke Mother   . CAD Maternal Grandmother   . CAD Maternal Grandfather     SOCHx    reports that he has never smoked. He has never used smokeless tobacco. He reports previous drug use. Drug: Marijuana. He reports that he does not drink alcohol.  Outpatient Medications   No current facility-administered medications on file prior to encounter.   Current Outpatient Medications on File Prior to Encounter  Medication Sig Dispense Refill  . acetaminophen (TYLENOL) 325 MG tablet Take 2 tablets (650 mg total) by mouth every 4 (four) hours as needed for fever or mild pain.    Marland Kitchen apixaban (ELIQUIS) 5 MG TABS tablet Take 1 tablet (5 mg total) by mouth 2 (two) times daily. 60 tablet 0  . Buprenorphine HCl-Naloxone HCl 8-2 MG FILM Place 1 tablet under the tongue in the morning and at bedtime.     . diphenhydrAMINE (BENADRYL) 25 MG tablet Take 25 mg by mouth every 6 (six) hours as needed for allergies.     Marland Kitchen gabapentin (NEURONTIN) 800 MG tablet Take 1 tablet (800 mg total) by mouth 3 (three) times daily. 90 tablet 1  . pantoprazole (PROTONIX) 40 MG tablet Take 40 mg by mouth 2 (two) times daily.    . polyethylene glycol (MIRALAX / GLYCOLAX) 17 g packet Take 17 g by mouth daily. (Patient taking differently: Take 17 g by mouth daily as needed for mild constipation. ) 14 each 0  . pravastatin (PRAVACHOL) 20 MG tablet Take 1 tablet (20 mg total) by mouth daily. 30 tablet 1  . spironolactone (ALDACTONE) 25 MG tablet Take 1 tablet (25 mg total) by mouth  daily. 90 tablet 3  . torsemide (DEMADEX) 20 MG tablet Take 2 tablets (40 mg total) by mouth every morning AND 1 tablet (20 mg total) every evening. 90 tablet 5  . VENTOLIN HFA 108 (90 Base) MCG/ACT inhaler Inhale 1 puff into the lungs every 4 (four) hours as needed for shortness of breath. 6.7 g 1  . zolpidem (AMBIEN) 10 MG tablet Take 10 mg by mouth at bedtime as needed for sleep.       Inpatient Medications    Scheduled Meds: . apixaban  5 mg Oral BID  . buprenorphine-naloxone  1 tablet Sublingual BID  . furosemide  40 mg Intravenous BID  . sodium chloride flush  3 mL Intravenous Q12H  . spironolactone  25 mg Oral Daily    Continuous Infusions: . sodium chloride      PRN Meds: sodium chloride, acetaminophen, ondansetron (ZOFRAN) IV, sodium chloride flush, zolpidem   ALLERGIES   Allergies  Allergen Reactions  . Penicillins  Did it involve swelling of the face/tongue/throat, SOB, or low BP? N/A Did it involve sudden or severe rash/hives, skin peeling, or any reaction on the inside of your mouth or nose? N/A Did you need to seek medical attention at a hospital or doctor's office?N/A When did it last happen?N/A If all above answers are "NO", may proceed with cephalosporin use.    ROS   Pertinent items noted in HPI and remainder of comprehensive ROS otherwise negative.  Vitals   Vitals:   01/27/20 0445 01/27/20 0630 01/27/20 0741 01/27/20 1010  BP: 125/77  123/70 (!) 141/70  Pulse: 72 78 68 70  Resp: 14 20 12 14   Temp:    97.9 F (36.6 C)  TempSrc:    Oral  SpO2: 94% 98% 94% 97%  Weight:      Height:       No intake or output data in the 24 hours ending 01/27/20 1019 Filed Weights   01/26/20 1600  Weight: (!) 170.1 kg    Physical Exam   General appearance: alert, no distress and morbidly obese Neck: JVD - several cm above sternal notch, no carotid bruit and thyroid not enlarged, symmetric, no tenderness/mass/nodules Lungs: diminished breath sounds  bibasilar Heart: regular rate and rhythm Abdomen: protuberant, possible fluid wave Extremities: edema 3+ LE edema, weeping Pulses: 1+ pulses Skin: LE chronic venous stasis with weeping edema Neurologic: Mental status: Alert, oriented, thought content appropriate, slow of thought Psych: Appears frustrated or fatigued  Labs   Results for orders placed or performed during the hospital encounter of 01/26/20 (from the past 48 hour(s))  CBC with Differential     Status: Abnormal   Collection Time: 01/26/20  4:12 PM  Result Value Ref Range   WBC 7.6 4.0 - 10.5 K/uL   RBC 4.78 4.22 - 5.81 MIL/uL   Hemoglobin 11.7 (L) 13.0 - 17.0 g/dL   HCT 40.0 39 - 52 %   MCV 83.7 80.0 - 100.0 fL   MCH 24.5 (L) 26.0 - 34.0 pg   MCHC 29.3 (L) 30.0 - 36.0 g/dL   RDW 16.0 (H) 11.5 - 15.5 %   Platelets 133 (L) 150 - 400 K/uL   nRBC 0.0 0.0 - 0.2 %   Neutrophils Relative % 80 %   Neutro Abs 6.1 1.7 - 7.7 K/uL   Lymphocytes Relative 10 %   Lymphs Abs 0.7 0.7 - 4.0 K/uL   Monocytes Relative 8 %   Monocytes Absolute 0.6 0.1 - 1.0 K/uL   Eosinophils Relative 2 %   Eosinophils Absolute 0.1 0.0 - 0.5 K/uL   Basophils Relative 0 %   Basophils Absolute 0.0 0.0 - 0.1 K/uL   Immature Granulocytes 0 %   Abs Immature Granulocytes 0.02 0.00 - 0.07 K/uL    Comment: Performed at [facility], [address], [place], [place] [address]  Comprehensive metabolic panel     Status: Abnormal   Collection Time: 01/26/20  4:12 PM  Result Value Ref Range   Sodium 139 135 - 145 mmol/L   Potassium 3.6 3.5 - 5.1 mmol/L   Chloride 97 (L) 98 - 111 mmol/L   CO2 31 22 - 32 mmol/L   Glucose, Bld 112 (H) 70 - 99 mg/dL    Comment: Glucose reference range applies only to samples taken after fasting for at least 8 hours.   BUN 12 8 - 23 mg/dL   Creatinine, Ser 1.16 0.61 - 1.24 mg/dL   Calcium 9.2 8.9 -  10.3 mg/dL   Total Protein 7.2 6.5 - 8.1 g/dL   Albumin 3.7 3.5 - 5.0 g/dL   AST 42 (H) 15 - 41 U/L   ALT 28 0 - 44 U/L    Alkaline Phosphatase 70 38 - 126 U/L   Total Bilirubin 0.8 0.3 - 1.2 mg/dL   GFR, Estimated >60 >60 mL/min   Anion gap 11 5 - 15    Comment: Performed at [facility] [address] [place], [place] [address]  Urinalysis, Routine w reflex microscopic     Status: None   Collection Time: 01/26/20  6:44 PM  Result Value Ref Range   Color, Urine YELLOW YELLOW   APPearance CLEAR CLEAR   Specific Gravity, Urine 1.016 1.005 - 1.030   pH 6.0 5.0 - 8.0   Glucose, UA NEGATIVE NEGATIVE mg/dL   Hgb urine dipstick NEGATIVE NEGATIVE   Bilirubin Urine NEGATIVE NEGATIVE   Ketones, ur NEGATIVE NEGATIVE mg/dL   Protein, ur NEGATIVE NEGATIVE mg/dL   Nitrite NEGATIVE NEGATIVE   Leukocytes,Ua NEGATIVE NEGATIVE    Comment: Performed at [facility] [address] [place], [place] [address]  Respiratory Panel by RT PCR (Flu A&B, Covid) - Nasopharyngeal Swab     Status: None   Collection Time: 01/27/20 12:16 AM   Specimen: Nasopharyngeal Swab  Result Value Ref Range   SARS Coronavirus 2 by RT PCR NEGATIVE NEGATIVE    Comment: (NOTE) SARS-CoV-2 target nucleic acids are NOT DETECTED.  The SARS-CoV-2 RNA is generally detectable in upper respiratoy specimens during the acute phase of infection. The lowest concentration of SARS-CoV-2 viral copies this assay can detect is 131 copies/mL. A negative result does not preclude SARS-Cov-2 infection and should not be used as the sole basis for treatment or other patient management decisions. A negative result may occur with  improper specimen collection/handling, submission of specimen other than nasopharyngeal swab, presence of viral mutation(s) within the areas targeted by this assay, and inadequate number of viral copies (<131 copies/mL). A negative result must be combined with clinical observations, patient history, and epidemiological information. The expected result is Negative.  Fact Sheet for Patients:   PinkCheek.be  Fact Sheet for Healthcare Providers:  GravelBags.it  This test is no t yet approved or cleared by the [place] FDA and  has been authorized for detection and/or diagnosis of SARS-CoV-2 by FDA under an Emergency Use Authorization (EUA). This EUA will remain  in effect (meaning this test can be used) for the duration of the COVID-19 declaration under Section 564(b)(1) of the Act, 21 U.S.C. section 360bbb-3(b)(1), unless the authorization is terminated or revoked sooner.     Influenza A by PCR NEGATIVE NEGATIVE   Influenza B by PCR NEGATIVE NEGATIVE    Comment: (NOTE) The Xpert Xpress SARS-CoV-2/FLU/RSV assay is intended as an aid in  the diagnosis of influenza from Nasopharyngeal swab specimens and  should not be used as a sole basis for treatment. Nasal washings and  aspirates are unacceptable for Xpert Xpress SARS-CoV-2/FLU/RSV  testing.  Fact Sheet for Patients: PinkCheek.be  Fact Sheet for Healthcare Providers: GravelBags.it  This test is not yet approved or cleared by the [place] FDA and  has been authorized for detection and/or diagnosis of SARS-CoV-2 by  FDA under an Emergency Use Authorization (EUA). This EUA will remain  in effect (meaning this test can be used) for the duration of the  Covid-19 declaration under Section 564(b)(1) of the Act, 21  U.S.C.  section 360bbb-3(b)(1), unless the authorization is  terminated or revoked. Performed at [facility], [place] [address], [place], [place] [address]     ECG   Sinus rhythm at 91, LAFB - Personally Reviewed  Telemetry   Sinus rhythm - Personally Reviewed  Radiology   DG Chest 2 View  Result Date: 01/26/2020 CLINICAL DATA:  Shortness of breath EXAM: CHEST - 2 VIEW COMPARISON:  09/25/2019 chest radiograph and prior. FINDINGS: Bibasilar opacities, likely atelectasis. No  pneumothorax or pleural effusion. Cardiomediastinal silhouette within normal limits. No acute osseous abnormality. Mild multilevel spondylosis. IMPRESSION: Bibasilar opacities, likely atelectasis. Electronically Signed   By: Primitivo Gauze M.D.   On: 01/26/2020 16:41    Cardiac Studies   N/A  Impression   1. Principal Problem: 2.   Acute on chronic right-sided heart failure ([place]) 3. Active Problems: 4.   Benign essential HTN 5.   Atrial fibrillation ([place]) 6.   Recommendation   1. Mr. Deruiter presents with acute on chronic probable right heart failure.  He has had several admissions for volume overload in the past 6 months and had persistent weight gain which was uncontrollable by oral diuretics and was sent for IV diuresis.  He is also struggled with atrial fibrillation although appeared to be in sinus rhythm here on admission.  He was not tolerant of calcium channel blockers due to worsening edema.  So far he has had IV Lasix twice but reports he is not peeing much.  No in and out data is available.  Admission weight is 170 kg and will need to monitor this closely.  BMP was not performed during this admission however was only 20-2 months ago and increased to 295 - 1 month ago.  Creatinine is top normal.  His home torsemide dose was 40 mg in the morning and 20 mg at night.  He is currently on 40 mg twice daily of Lasix.  I would recommend increasing that further to 80 mg IV twice daily and he will likely need metolazone if he is not augmenting diuresis.  Follow daily weights and strict I's and O's.  Thanks for the consultation.  Cardiology will follow with you closely.  Plan of care discussed with Dr. Cyndia Skeeters.  Time Spent Directly with Patient:  I have spent a total of 65 minutes with the patient reviewing hospital notes, telemetry, EKGs, labs and examining the patient as well as establishing an assessment and plan that was discussed personally with the patient.  > 50% of time was spent  in direct patient care.  Length of Stay:  LOS: 0 days   Pixie Casino, MD, [facility], [place] Director of the [facility] Lipid [facility] &  Cardiovascular Risk Reduction Clinic Diplomate of the American Board of Clinical Lipidology Attending Cardiologist  Direct Dial: [phone]  Fax: [phone]  Website:  [email or web address] Inclan 01/27/2020, 10:19 AM

## 2020-01-27 NOTE — Plan of Care (Signed)
  Problem: Education: Goal: Ability to verbalize understanding of medication therapies will improve Outcome: Progressing   Problem: Cardiac: Goal: Ability to achieve and maintain adequate cardiopulmonary perfusion will improve Outcome: Progressing   

## 2020-01-27 NOTE — Progress Notes (Signed)
PROGRESS NOTE  Charles Gray LKG:[id number] DOB: 07/22/1955   PCP: Enid Skeens., MD  Patient is from: Home  DOA: 01/26/2020 LOS: 0  Chief complaints: Leg swelling  Brief Narrative / Interim history: 64 year old man with history of paroxysmal A. fib on Eliquis, probable chronic right heart failure, hypertension, possible cirrhosis, OSA and PE/DVT in 05/2019 direct up to ED by his cardiologist for refractory bilateral lower extremity edema and weight gain.   In ED, hemodynamically stable.  95% on room air.  CXR with bibasilar opacities.  Basic labs without significant finding other than thrombocytopenia to 130.  Started on IV Lasix and admitted for fluid overload/bilateral lower extremity edema and concern for acute on chronic right heart failure.  The next day, cardiology consulted, and recommended IV Lasix with metolazone.   Subjective: Seen and examined earlier this morning.  No major events overnight of this morning.  Reports intermittent chest pain and shortness of breath over the last few days.  Currently no chest pain or shortness of breath.  He denies cough or fever.  He did not make a lot of urine with IV Lasix yet.  Still with significant bilateral lower extremity edema and weeping blisters.  Objective: Vitals:   01/27/20 0630 01/27/20 0741 01/27/20 1010 01/27/20 1030  BP:  123/70 (!) 141/70 117/66  Pulse: 78 68 70 67  Resp: 20 12 14 10   Temp:   97.9 F (36.6 C)   TempSrc:   Oral   SpO2: 98% 94% 97% 97%  Weight:      Height:       No intake or output data in the 24 hours ending 01/27/20 1154 Filed Weights   01/26/20 1600  Weight: (!) 170.1 kg    Examination:  GENERAL: No apparent distress.  Nontoxic. HEENT: MMM.  Vision and hearing grossly intact.  NECK: Supple.  No apparent JVD but difficult exam due to body habitus.  RESP: On room air.  No IWOB.  Fair aeration.  No rales or crackles. CVS:  RRR. Heart sounds normal.  ABD/GI/GU: BS+. Abd soft, NTND.   MSK/EXT:  Moves extremities.  2+ pitting edema to his knees with weeping blisters.  SKIN: Some erythema over BLE suggestive for chronic venous insufficiency NEURO: Awake, alert and oriented appropriately.  No apparent focal neuro deficit. PSYCH: Calm. Normal affect.   Procedures:  None  Microbiology summarized: COVID-19 PCR negative. Influenza PCR negative.  Assessment & Plan: Fluid overload/acute on chronic right heart failure-he has marked bilateral lower extremity edema with weeping blisters that did not respond to adjustment of his diuretics at home.  He was sent to ED by his cardiologist for IV diuretics.  CXR with bibasilar opacities but poor quality due to body habitus.  He might have some element of chronic venous insufficiency as well.  He was also started on Cardizem for A. fib which could have contributed.  TTE in 07/2019 with EF of 55 to 60%, G1-DD. RVSF was not well-visualized. RHC at the same time reassuring. -Check BNP -On IV Lasix 80 mg twice daily, metolazone 2.5 mg daily and Aldactone 25 mg daily per cardiology -Check ABI.  He may benefit from [employer] boots -Check BNP -Leg elevation, fluid and sodium restriction -Monitor fluid status and renal functions.  Paroxysmal atrial fibrillation: In sinus rhythm.  Was on Cardizem -Continue Eliquis for anticoagulation. -Rate control choice per cardiology  OSA -Nightly CPAP  History of DVT/PE -Continue Eliquis  Essential hypertension: Normotensive -Diuretics as above.  Chronic pain syndrome -  Continue home Suboxone and gabapentin  Thrombocytopenia: Platelet 133.  The same on 12/26/2019.  -Continue monitoring  Morbid obesity Body mass index is 39.21 kg/m.         DVT prophylaxis:   apixaban (ELIQUIS) tablet 5 mg    Code Status: Full code Family Communication: Patient and/or RN. Available if any question.  Status is: Observation  The patient will require care spanning > 2 midnights and should be moved to  inpatient because: IV treatments appropriate due to intensity of illness or inability to take PO and Inpatient level of care appropriate due to severity of illness.  Patient acute on chronic right heart failure and fluid overload as evidenced by significant bilateral lower extremity edema with weeping.  Requires aggressive diuresis with IV Lasix over the next 2 to 3 days.  Dispo: The patient is from: Home              Anticipated d/c is to: Home              Anticipated d/c date is: > 3 days              Patient currently is not medically stable to d/c.       Consultants:  Cardiology   Sch Meds:  Scheduled Meds: . apixaban  5 mg Oral BID  . buprenorphine-naloxone  1 tablet Sublingual BID  . furosemide  80 mg Intravenous BID  . gabapentin  800 mg Oral TID  . metolazone  2.5 mg Oral Once  . pantoprazole  40 mg Oral BID  . pravastatin  20 mg Oral q1800  . sodium chloride flush  3 mL Intravenous Q12H  . spironolactone  25 mg Oral Daily   Continuous Infusions: . sodium chloride     PRN Meds:.sodium chloride, acetaminophen, ondansetron (ZOFRAN) IV, polyethylene glycol, sodium chloride flush, zolpidem  Antimicrobials: Anti-infectives (From admission, onward)   None       I have personally reviewed the following labs and images: CBC: Recent Labs  Lab 01/26/20 1612  WBC 7.6  NEUTROABS 6.1  HGB 11.7*  HCT 40.0  MCV 83.7  PLT 133*   BMP &GFR Recent Labs  Lab 01/26/20 1612  NA 139  K 3.6  CL 97*  CO2 31  GLUCOSE 112*  BUN 12  CREATININE 1.16  CALCIUM 9.2   Estimated Creatinine Clearance: 116.8 mL/min (by C-G formula based on SCr of 1.16 mg/dL). Liver & Pancreas: Recent Labs  Lab 01/26/20 1612  AST 42*  ALT 28  ALKPHOS 70  BILITOT 0.8  PROT 7.2  ALBUMIN 3.7   No results for input(s): LIPASE, AMYLASE in the last 168 hours. No results for input(s): AMMONIA in the last 168 hours. Diabetic: No results for input(s): HGBA1C in the last 72 hours. No  results for input(s): GLUCAP in the last 168 hours. Cardiac Enzymes: No results for input(s): CKTOTAL, CKMB, CKMBINDEX, TROPONINI in the last 168 hours. Recent Labs    02/27/19 1106  PROBNP 37   Coagulation Profile: No results for input(s): INR, PROTIME in the last 168 hours. Thyroid Function Tests: No results for input(s): TSH, T4TOTAL, FREET4, T3FREE, THYROIDAB in the last 72 hours. Lipid Profile: No results for input(s): CHOL, HDL, LDLCALC, TRIG, CHOLHDL, LDLDIRECT in the last 72 hours. Anemia Panel: No results for input(s): VITAMINB12, FOLATE, FERRITIN, TIBC, IRON, RETICCTPCT in the last 72 hours. Urine analysis:    Component Value Date/Time   COLORURINE YELLOW 01/26/2020 [place] 01/26/2020  [place] 1.016 01/26/2020 1844   PHURINE 6.0 01/26/2020 1844   GLUCOSEU NEGATIVE 01/26/2020 [place] 01/26/2020 [place] 01/26/2020 [place] 01/26/2020 1844   PROTEINUR NEGATIVE 01/26/2020 1844   NITRITE NEGATIVE 01/26/2020 1844   LEUKOCYTESUR NEGATIVE 01/26/2020 1844   Sepsis Labs: Invalid input(s): PROCALCITONIN, [place]  Microbiology: Recent Results (from the past 240 hour(s))  Respiratory Panel by RT PCR (Flu A&B, Covid) - Nasopharyngeal Swab     Status: None   Collection Time: 01/27/20 12:16 AM   Specimen: Nasopharyngeal Swab  Result Value Ref Range Status   SARS Coronavirus 2 by RT PCR NEGATIVE NEGATIVE Final    Comment: (NOTE) SARS-CoV-2 target nucleic acids are NOT DETECTED.  The SARS-CoV-2 RNA is generally detectable in upper respiratoy specimens during the acute phase of infection. The lowest concentration of SARS-CoV-2 viral copies this assay can detect is 131 copies/mL. A negative result does not preclude SARS-Cov-2 infection and should not be used as the sole basis for treatment or other patient management decisions. A negative result may occur with  improper specimen collection/handling,  submission of specimen other than nasopharyngeal swab, presence of viral mutation(s) within the areas targeted by this assay, and inadequate number of viral copies (<131 copies/mL). A negative result must be combined with clinical observations, patient history, and epidemiological information. The expected result is Negative.  Fact Sheet for Patients:  PinkCheek.be  Fact Sheet for Healthcare Providers:  GravelBags.it  This test is no t yet approved or cleared by the [place] FDA and  has been authorized for detection and/or diagnosis of SARS-CoV-2 by FDA under an Emergency Use Authorization (EUA). This EUA will remain  in effect (meaning this test can be used) for the duration of the COVID-19 declaration under Section 564(b)(1) of the Act, 21 U.S.C. section 360bbb-3(b)(1), unless the authorization is terminated or revoked sooner.     Influenza A by PCR NEGATIVE NEGATIVE Final   Influenza B by PCR NEGATIVE NEGATIVE Final    Comment: (NOTE) The Xpert Xpress SARS-CoV-2/FLU/RSV assay is intended as an aid in  the diagnosis of influenza from Nasopharyngeal swab specimens and  should not be used as a sole basis for treatment. Nasal washings and  aspirates are unacceptable for Xpert Xpress SARS-CoV-2/FLU/RSV  testing.  Fact Sheet for Patients: PinkCheek.be  Fact Sheet for Healthcare Providers: GravelBags.it  This test is not yet approved or cleared by the [place] FDA and  has been authorized for detection and/or diagnosis of SARS-CoV-2 by  FDA under an Emergency Use Authorization (EUA). This EUA will remain  in effect (meaning this test can be used) for the duration of the  Covid-19 declaration under Section 564(b)(1) of the Act, 21  U.S.C. section 360bbb-3(b)(1), unless the authorization is  terminated or revoked. Performed at [facility], [place] [address] [place], [place] [address]     Radiology Studies: DG Chest 2 View  Result Date: 01/26/2020 CLINICAL DATA:  Shortness of breath EXAM: CHEST - 2 VIEW COMPARISON:  09/25/2019 chest radiograph and prior. FINDINGS: Bibasilar opacities, likely atelectasis. No pneumothorax or pleural effusion. Cardiomediastinal silhouette within normal limits. No acute osseous abnormality. Mild multilevel spondylosis. IMPRESSION: Bibasilar opacities, likely atelectasis. Electronically Signed   By: Primitivo Gauze M.D.   On: 01/26/2020 16:41     Glenisha Gundry T. [place]  If 7PM-7AM, please contact night-coverage [email or web address] 01/27/2020, 11:54 AM

## 2020-01-28 DIAGNOSIS — E876 Hypokalemia: Secondary | ICD-10-CM

## 2020-01-28 DIAGNOSIS — G4733 Obstructive sleep apnea (adult) (pediatric): Secondary | ICD-10-CM | POA: Diagnosis not present

## 2020-01-28 DIAGNOSIS — I872 Venous insufficiency (chronic) (peripheral): Secondary | ICD-10-CM | POA: Diagnosis not present

## 2020-01-28 DIAGNOSIS — I50813 Acute on chronic right heart failure: Secondary | ICD-10-CM | POA: Diagnosis not present

## 2020-01-28 DIAGNOSIS — I480 Paroxysmal atrial fibrillation: Secondary | ICD-10-CM | POA: Diagnosis not present

## 2020-01-28 LAB — RENAL FUNCTION PANEL
Albumin: 3.7 g/dL (ref 3.5–5.0)
Anion gap: 11 (ref 5–15)
BUN: 17 mg/dL (ref 8–23)
CO2: 32 mmol/L (ref 22–32)
Calcium: 9.4 mg/dL (ref 8.9–10.3)
Chloride: 92 mmol/L — ABNORMAL LOW (ref 98–111)
Creatinine, Ser: 1.08 mg/dL (ref 0.61–1.24)
GFR, Estimated: >60 mL/min (ref >60)
Glucose, Bld: 118 mg/dL — ABNORMAL HIGH (ref 70–99)
Phosphorus: 4.4 mg/dL (ref 2.5–4.6)
Potassium: 3 mmol/L — ABNORMAL LOW (ref 3.5–5.1)
Sodium: 135 mmol/L (ref 135–145)

## 2020-01-28 LAB — CBC
HCT: 37.8 % — ABNORMAL LOW (ref 39.0–52.0)
Hemoglobin: 11.8 g/dL — ABNORMAL LOW (ref 13.0–17.0)
MCH: 24.8 pg — ABNORMAL LOW (ref 26.0–34.0)
MCHC: 31.2 g/dL (ref 30.0–36.0)
MCV: 79.6 fL — ABNORMAL LOW (ref 80.0–100.0)
Platelets: 146 10*3/uL — ABNORMAL LOW (ref 150–400)
RBC: 4.75 MIL/uL (ref 4.22–5.81)
RDW: 15.8 % — ABNORMAL HIGH (ref 11.5–15.5)
WBC: 6.7 10*3/uL (ref 4.0–10.5)
nRBC: 0 % (ref 0.0–0.2)

## 2020-01-28 LAB — LIPID PANEL
Cholesterol: 130 mg/dL (ref 0–200)
HDL: 37 mg/dL — ABNORMAL LOW (ref >40)
LDL Cholesterol: 73 mg/dL (ref 0–99)
Total CHOL/HDL Ratio: 3.5 RATIO
Triglycerides: 100 mg/dL (ref <150)
VLDL: 20 mg/dL (ref 0–40)

## 2020-01-28 LAB — MAGNESIUM: Magnesium: 2.2 mg/dL (ref 1.7–2.4)

## 2020-01-28 MED ORDER — METOLAZONE 2.5 MG PO TABS
2.5000 mg | ORAL_TABLET | ORAL | Status: AC
  Administered 2020-01-28: 2.5 mg via ORAL
  Filled 2020-01-28: qty 1

## 2020-01-28 MED ORDER — POTASSIUM CHLORIDE CRYS ER 20 MEQ PO TBCR
40.0000 meq | EXTENDED_RELEASE_TABLET | ORAL | Status: AC
  Administered 2020-01-28 (×3): 40 meq via ORAL
  Filled 2020-01-28 (×3): qty 2

## 2020-01-28 NOTE — Plan of Care (Signed)
  Problem: Education: Goal: Ability to demonstrate management of disease process will improve Outcome: Progressing Goal: Ability to verbalize understanding of medication therapies will improve Outcome: Progressing   Problem: Activity: Goal: Capacity to carry out activities will improve Outcome: Progressing   Problem: Education: Goal: Knowledge of General Education information will improve Description: Including pain rating scale, medication(s)/side effects and non-pharmacologic comfort measures Outcome: Progressing   Problem: Clinical Measurements: Goal: Ability to maintain clinical measurements within normal limits will improve Outcome: Progressing Goal: Will remain free from infection Outcome: Progressing

## 2020-01-28 NOTE — Progress Notes (Signed)
PROGRESS NOTE  Charles Gray CBU:[id number] DOB: Jun 27, 1955   PCP: Enid Skeens., MD  Patient is from: Home  DOA: 01/26/2020 LOS: 1  Chief complaints: Leg swelling  Brief Narrative / Interim history: 64 year old man with history of paroxysmal A. fib on Eliquis, probable chronic right heart failure, hypertension, possible cirrhosis, OSA and PE/DVT in 05/2019 direct up to ED by his cardiologist for refractory bilateral lower extremity edema and weight gain.   In ED, hemodynamically stable.  95% on room air.  CXR with bibasilar opacities.  Basic labs without significant finding other than thrombocytopenia to 130.  Started on IV Lasix and admitted for fluid overload/bilateral lower extremity edema and concern for acute on chronic right heart failure.  The next day, cardiology consulted, and recommended IV Lasix with metolazone.  ABI ordered.  Subjective: Seen and examined earlier this morning.  No major events overnight or this morning.  Leg swelling or pain improved.  No chest pain or dyspnea.  Excellent urine output.  About 2 L overnight.  Renal function stable.  Objective: Vitals:   01/27/20 2030 01/28/20 0024 01/28/20 0359 01/28/20 0721  BP: (!) 138/95 (!) 151/78 125/84 128/81  Pulse: 79 79 75 81  Resp: 19 20 15 16   Temp: 98.8 F (37.1 C) 98.6 F (37 C) 98.5 F (36.9 C) 98.8 F (37.1 C)  TempSrc: Oral Oral Oral Oral  SpO2: 92% 98% 96% 96%  Weight:   (!) 169.4 kg   Height:        Intake/Output Summary (Last 24 hours) at 01/28/2020 1028 Last data filed at 01/28/2020 0446 Gross per 24 hour  Intake 120 ml  Output 1920 ml  Net -1800 ml   Filed Weights   01/26/20 1600 01/27/20 1838 01/28/20 0359  Weight: (!) 170.1 kg (!) 169.8 kg (!) 169.4 kg    Examination:  GENERAL: No apparent distress.  Nontoxic. HEENT: MMM.  Vision and hearing grossly intact.  NECK: Supple.  No apparent JVD.  RESP: On room air.  No IWOB.  Fair aeration bilaterally. CVS:  RRR. Heart sounds  normal.  ABD/GI/GU: BS+. Abd soft, NTND.  MSK/EXT:  Moves extremities. No apparent deformity.  1+ pitting edema bilaterally.  Blister/bulla over LLE. SKIN: no apparent skin lesion or wound NEURO: Awake, alert and oriented appropriately.  No apparent focal neuro deficit. PSYCH: Calm. Normal affect.  Procedures:  None  Microbiology summarized: COVID-19 PCR negative. Influenza PCR negative.  Assessment & Plan: Fluid overload/bilateral lower extremity edema due to chronic venous insufficiency versus right-sided heart failure-he has marked bilateral lower extremity edema with weeping blisters that did not respond to adjustment of his diuretics at home.  He was sent to ED by his cardiologist for IV diuretics.  CXR with bibasilar opacities but poor quality due to body habitus. He was also started on Cardizem for A. fib which could have contributed.  TTE in 07/2019 with EF of 55 to 60%, G1-DD. RVSF was not well-visualized. RHC at the same time reassuring.  BNP within normal arguing against heart failure exacerbation.  Excellent response to IV Lasix, about 2 L / 24 hours.  BP and renal function stable.  Still with significant BLE edema. -Continue IV Lasix 80 mg twice daily, metolazone 2.5 mg daily and Aldactone 25 mg daily per cardiology -Follow ABI. If okay will, try wrapping or unna boot.  -He would benefit from outpatient venous insufficiency studies. -Leg elevation, fluid and sodium restriction -Monitor fluid status and renal functions.  Paroxysmal atrial fibrillation: In  sinus rhythm.  Was on Cardizem -Continue Eliquis for anticoagulation. -Rate control choice per cardiology  OSA -Nightly CPAP  History of DVT/PE -Continue Eliquis  Essential hypertension: Normotensive -Diuretics as above.  Chronic pain syndrome -Continue home Suboxone and gabapentin  Thrombocytopenia: Platelet 133> 146.  The same on 12/26/2019.  -Continue monitoring  Hypokalemia: Likely due to diuretics.  Magnesium  was normal. -Replenish and recheck  Morbid obesity Body mass index is 39.05 kg/m.         DVT prophylaxis:   apixaban (ELIQUIS) tablet 5 mg    Code Status: Full code Family Communication: Patient and/or RN. Available if any question.  Status is: Inpatient  Remains inpatient appropriate because:Persistent severe electrolyte disturbances, IV treatments appropriate due to intensity of illness or inability to take PO and Inpatient level of care appropriate due to severity of illness   Dispo: The patient is from: Home              Anticipated d/c is to: Home              Anticipated d/c date is: 1 day              Patient currently is not medically stable to d/c.            Consultants:  Cardiology   Sch Meds:  Scheduled Meds: . apixaban  5 mg Oral BID  . buprenorphine-naloxone  1 tablet Sublingual BID  . furosemide  80 mg Intravenous BID  . gabapentin  800 mg Oral TID  . metolazone  2.5 mg Oral NOW  . pantoprazole  40 mg Oral BID  . potassium chloride  40 mEq Oral Q4H  . pravastatin  20 mg Oral q1800  . sodium chloride flush  3 mL Intravenous Q12H  . spironolactone  25 mg Oral Daily   Continuous Infusions: . sodium chloride     PRN Meds:.sodium chloride, acetaminophen, ondansetron (ZOFRAN) IV, polyethylene glycol, sodium chloride flush, zolpidem  Antimicrobials: Anti-infectives (From admission, onward)   None       I have personally reviewed the following labs and images: CBC: Recent Labs  Lab 01/26/20 1612 01/28/20 0104  WBC 7.6 6.7  NEUTROABS 6.1  --   HGB 11.7* 11.8*  HCT 40.0 37.8*  MCV 83.7 79.6*  PLT 133* 146*   BMP &GFR Recent Labs  Lab 01/26/20 1612 01/28/20 0104  NA 139 135  K 3.6 3.0*  CL 97* 92*  CO2 31 32  GLUCOSE 112* 118*  BUN 12 17  CREATININE 1.16 1.08  CALCIUM 9.2 9.4  MG  --  2.2  PHOS  --  4.4   Estimated Creatinine Clearance: 125.2 mL/min (by C-G formula based on SCr of 1.08 mg/dL). Liver &  Pancreas: Recent Labs  Lab 01/26/20 1612 01/28/20 0104  AST 42*  --   ALT 28  --   ALKPHOS 70  --   BILITOT 0.8  --   PROT 7.2  --   ALBUMIN 3.7 3.7   No results for input(s): LIPASE, AMYLASE in the last 168 hours. No results for input(s): AMMONIA in the last 168 hours. Diabetic: No results for input(s): HGBA1C in the last 72 hours. No results for input(s): GLUCAP in the last 168 hours. Cardiac Enzymes: No results for input(s): CKTOTAL, CKMB, CKMBINDEX, TROPONINI in the last 168 hours. Recent Labs    02/27/19 1106  PROBNP 37   Coagulation Profile: No results for input(s): INR, PROTIME in the last 168 hours.  Thyroid Function Tests: No results for input(s): TSH, T4TOTAL, FREET4, T3FREE, THYROIDAB in the last 72 hours. Lipid Profile: Recent Labs    01/28/20 0104  CHOL 130  HDL 37*  LDLCALC 73  TRIG 100  CHOLHDL 3.5   Anemia Panel: No results for input(s): VITAMINB12, FOLATE, FERRITIN, TIBC, IRON, RETICCTPCT in the last 72 hours. Urine analysis:    Component Value Date/Time   COLORURINE YELLOW 01/26/2020 1844   APPEARANCEUR CLEAR 01/26/2020 1844   LABSPEC 1.016 01/26/2020 1844   PHURINE 6.0 01/26/2020 1844   GLUCOSEU NEGATIVE 01/26/2020 1844   HGBUR NEGATIVE 01/26/2020 1844   BILIRUBINUR NEGATIVE 01/26/2020 1844   KETONESUR NEGATIVE 01/26/2020 1844   PROTEINUR NEGATIVE 01/26/2020 1844   NITRITE NEGATIVE 01/26/2020 1844   LEUKOCYTESUR NEGATIVE 01/26/2020 1844   Sepsis Labs: Invalid input(s): PROCALCITONIN, [name]  Microbiology: Recent Results (from the past 240 hour(s))  Respiratory Panel by RT PCR (Flu A&B, Covid) - Nasopharyngeal Swab     Status: None   Collection Time: 01/27/20 12:16 AM   Specimen: Nasopharyngeal Swab  Result Value Ref Range Status   SARS Coronavirus 2 by RT PCR NEGATIVE NEGATIVE Final    Comment: (NOTE) SARS-CoV-2 target nucleic acids are NOT DETECTED.  The SARS-CoV-2 RNA is generally detectable in upper respiratoy specimens  during the acute phase of infection. The lowest concentration of SARS-CoV-2 viral copies this assay can detect is 131 copies/mL. A negative result does not preclude SARS-Cov-2 infection and should not be used as the sole basis for treatment or other patient management decisions. A negative result may occur with  improper specimen collection/handling, submission of specimen other than nasopharyngeal swab, presence of viral mutation(s) within the areas targeted by this assay, and inadequate number of viral copies (<131 copies/mL). A negative result must be combined with clinical observations, patient history, and epidemiological information. The expected result is Negative.  Fact Sheet for Patients:  PinkCheek.be  Fact Sheet for Healthcare Providers:  GravelBags.it  This test is no t yet approved or cleared by the [place] FDA and  has been authorized for detection and/or diagnosis of SARS-CoV-2 by FDA under an Emergency Use Authorization (EUA). This EUA will remain  in effect (meaning this test can be used) for the duration of the COVID-19 declaration under Section 564(b)(1) of the Act, 21 U.S.C. section 360bbb-3(b)(1), unless the authorization is terminated or revoked sooner.     Influenza A by PCR NEGATIVE NEGATIVE Final   Influenza B by PCR NEGATIVE NEGATIVE Final    Comment: (NOTE) The Xpert Xpress SARS-CoV-2/FLU/RSV assay is intended as an aid in  the diagnosis of influenza from Nasopharyngeal swab specimens and  should not be used as a sole basis for treatment. Nasal washings and  aspirates are unacceptable for Xpert Xpress SARS-CoV-2/FLU/RSV  testing.  Fact Sheet for Patients: PinkCheek.be  Fact Sheet for Healthcare Providers: GravelBags.it  This test is not yet approved or cleared by the [place] FDA and  has been authorized for detection and/or  diagnosis of SARS-CoV-2 by  FDA under an Emergency Use Authorization (EUA). This EUA will remain  in effect (meaning this test can be used) for the duration of the  Covid-19 declaration under Section 564(b)(1) of the Act, 21  U.S.C. section 360bbb-3(b)(1), unless the authorization is  terminated or revoked. Performed at [facility], [place] [address] [place], [name] [address]   MRSA PCR Screening     Status: None   Collection Time: 01/27/20  6:46 PM  Specimen: Nasal Mucosa; Nasopharyngeal  Result Value Ref Range Status   MRSA by PCR NEGATIVE NEGATIVE Final    Comment:        The GeneXpert MRSA Assay (FDA approved for NASAL specimens only), is one component of a comprehensive MRSA colonization surveillance program. It is not intended to diagnose MRSA infection nor to guide or monitor treatment for MRSA infections. Performed at [facility], [place] [address], [place],  [address]     Radiology Studies: No results found.   Erion Hermans T. Mather  If 7PM-7AM, please contact night-coverage [email or web address] 01/28/2020, 10:28 AM

## 2020-01-28 NOTE — Evaluation (Signed)
Physical Therapy Evaluation Patient Details Name: Charles Gray MRN: [id number] DOB: 1955/12/01 Today's Date: 01/28/2020   History of Present Illness  Pt adm with acute on chronic rt heart failure. PMH - chf, htn, osa, PE/DVT, afib, obesity, chronic pain,   Clinical Impression  Pt admitted with above diagnosis and presents to PT with functional limitations due to deficits listed below (See PT problem list). Pt needs skilled PT to maximize independence and safety to allow discharge to home.      Follow Up Recommendations No PT follow up    Equipment Recommendations  None recommended by PT    Recommendations for Other Services       Precautions / Restrictions        Mobility  Bed Mobility               General bed mobility comments: Pt up in chair  Transfers Overall transfer level: Needs assistance Equipment used: Rolling walker (2 wheeled) Transfers: Sit to/from Stand Sit to Stand: Min guard         General transfer comment: Incr time and effort from low chair  Ambulation/Gait Ambulation/Gait assistance: Supervision Gait Distance (Feet): 240 Feet Assistive device: Rolling walker (2 wheeled) Gait Pattern/deviations: Step-through pattern;Decreased stride length;Trunk flexed;Wide base of support Gait velocity: decr Gait velocity interpretation: <1.31 ft/sec, indicative of household ambulator General Gait Details: Assist for safety. Pt with 3 standing rest breaks  Stairs            Wheelchair Mobility    Modified Rankin (Stroke Patients Only)       Balance Overall balance assessment: Needs assistance Sitting-balance support: No upper extremity supported;Feet supported Sitting balance-Leahy Scale: Good     Standing balance support: No upper extremity supported;During functional activity Standing balance-Leahy Scale: Fair                               Pertinent Vitals/Pain Pain Assessment: Faces Faces Pain Scale: Hurts little  more Pain Location: back Pain Descriptors / Indicators: Grimacing Pain Intervention(s): Limited activity within patient's tolerance;Repositioned    Home Living Family/patient expects to be discharged to:: Private residence Living Arrangements: Alone Available Help at Discharge: Family;Available PRN/intermittently Type of Home: Apartment Home Access: Stairs to enter Entrance Stairs-Rails: Right;Left;Can reach both Entrance Stairs-Number of Steps: 16 steps Home Layout: One level Home Equipment: Tub bench;Walker - 2 wheels;Other (comment);Hand held shower head      Prior Function Level of Independence: Independent with assistive device(s)         Comments: Does not drive, cooks, [employer]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), uses walker intermittendly     Hand Dominance   Dominant Hand: Right    Extremity/Trunk Assessment   Upper Extremity Assessment Upper Extremity Assessment: Defer to OT evaluation    Lower Extremity Assessment Lower Extremity Assessment: Generalized weakness       Communication   Communication: No difficulties  Cognition Arousal/Alertness: Awake/alert Behavior During Therapy: WFL for tasks assessed/performed Overall Cognitive Status: Within Functional Limits for tasks assessed                                        General Comments      Exercises     Assessment/Plan    PT Assessment Patient needs continued PT services  PT Problem List Decreased strength;Decreased activity tolerance;Decreased balance;Decreased mobility  PT Treatment Interventions DME instruction;Gait training;Functional mobility training;Therapeutic activities;Therapeutic exercise;Balance training;Patient/family education    PT Goals (Current goals can be found in the Care Plan section)  Acute Rehab PT Goals Patient Stated Goal: return home PT Goal Formulation: With patient Time For Goal Achievement: 02/11/20 Potential to Achieve Goals:  Good    Frequency Min 3X/week   Barriers to discharge Inaccessible home environment;Decreased caregiver support Lives alone. Stairs to enter home    Co-evaluation               AM-PAC PT "6 Clicks" Mobility  Outcome Measure Help needed turning from your back to your side while in a flat bed without using bedrails?: None Help needed moving from lying on your back to sitting on the side of a flat bed without using bedrails?: None Help needed moving to and from a bed to a chair (including a wheelchair)?: A Little Help needed standing up from a chair using your arms (e.g., wheelchair or bedside chair)?: A Little Help needed to walk in hospital room?: A Little Help needed climbing 3-5 steps with a railing? : A Little 6 Click Score: 20    End of Session   Activity Tolerance: Patient limited by fatigue Patient left: in chair;with call bell/phone within reach Nurse Communication: Mobility status PT Visit Diagnosis: Other abnormalities of gait and mobility (R26.89);Muscle weakness (generalized) (M62.81)    Time: 1130-1147 PT Time Calculation (min) (ACUTE ONLY): 17 min   Charges:   PT Evaluation $PT Eval Moderate Complexity: [place] Pager [phone] Office [place] 01/28/2020, 1:56 PM

## 2020-01-28 NOTE — Progress Notes (Signed)
DAILY PROGRESS NOTE   Patient Name: Charles Gray Date of Encounter: 01/28/2020 Cardiologist: Berniece Salines, DO  Chief Complaint   "I feel off today"  Patient Profile   64 yo male with history of chronic right heart failure, failed diuretics at home, referred for IV diuretics and further evaluation by Dr. Harriet Masson  Subjective   Less swollen today - net negative 1.8L. Creatinine improved from 1.16 to 1.08. Potassium 3.0 today (Mg 2.2). LDL 73. BNP low at 24. Occasional PVC's overnight - artifact (no NSVT).  Objective   Vitals:   01/27/20 2030 01/28/20 0024 01/28/20 0359 01/28/20 0721  BP: (!) 138/95 (!) 151/78 125/84 128/81  Pulse: 79 79 75 81  Resp: 19 20 15 16   Temp: 98.8 F (37.1 C) 98.6 F (37 C) 98.5 F (36.9 C) 98.8 F (37.1 C)  TempSrc: Oral Oral Oral Oral  SpO2: 92% 98% 96% 96%  Weight:   (!) 169.4 kg   Height:        Intake/Output Summary (Last 24 hours) at 01/28/2020 0917 Last data filed at 01/28/2020 0446 Gross per 24 hour  Intake 120 ml  Output 1920 ml  Net -1800 ml   Filed Weights   01/26/20 1600 01/27/20 1838 01/28/20 0359  Weight: (!) 170.1 kg (!) 169.8 kg (!) 169.4 kg    Physical Exam   General appearance: alert, no distress and morbidly obese Neck: no carotid bruit, no JVD and thyroid not enlarged, symmetric, no tenderness/mass/nodules Lungs: diminished breath sounds bibasilar Heart: regular rate and rhythm Abdomen: soft, non-tender; bowel sounds normal; no masses,  no organomegaly and protuberant Extremities: edema 2+ bilateral pitting and venous stasis dermatitis noted Pulses: 2+ and symmetric Skin: LE venous stasis changes Neurologic: Grossly normal Psych: Pleasant  Inpatient Medications    Scheduled Meds: . apixaban  5 mg Oral BID  . buprenorphine-naloxone  1 tablet Sublingual BID  . furosemide  80 mg Intravenous BID  . gabapentin  800 mg Oral TID  . pantoprazole  40 mg Oral BID  . potassium chloride  40 mEq Oral Q4H  .  pravastatin  20 mg Oral q1800  . sodium chloride flush  3 mL Intravenous Q12H  . spironolactone  25 mg Oral Daily    Continuous Infusions: . sodium chloride      PRN Meds: sodium chloride, acetaminophen, ondansetron (ZOFRAN) IV, polyethylene glycol, sodium chloride flush, zolpidem   Labs   Results for orders placed or performed during the hospital encounter of 01/26/20 (from the past 48 hour(s))  CBC with Differential     Status: Abnormal   Collection Time: 01/26/20  4:12 PM  Result Value Ref Range   WBC 7.6 4.0 - 10.5 K/uL   RBC 4.78 4.22 - 5.81 MIL/uL   Hemoglobin 11.7 (L) 13.0 - 17.0 g/dL   HCT 40.0 39 - 52 %   MCV 83.7 80.0 - 100.0 fL   MCH 24.5 (L) 26.0 - 34.0 pg   MCHC 29.3 (L) 30.0 - 36.0 g/dL   RDW 16.0 (H) 11.5 - 15.5 %   Platelets 133 (L) 150 - 400 K/uL   nRBC 0.0 0.0 - 0.2 %   Neutrophils Relative % 80 %   Neutro Abs 6.1 1.7 - 7.7 K/uL   Lymphocytes Relative 10 %   Lymphs Abs 0.7 0.7 - 4.0 K/uL   Monocytes Relative 8 %   Monocytes Absolute 0.6 0.1 - 1.0 K/uL   Eosinophils Relative 2 %   Eosinophils Absolute 0.1 0.0 -  0.5 K/uL   Basophils Relative 0 %   Basophils Absolute 0.0 0.0 - 0.1 K/uL   Immature Granulocytes 0 %   Abs Immature Granulocytes 0.02 0.00 - 0.07 K/uL    Comment: Performed at [facility], [place] [address] [place], [place] [address]  Comprehensive metabolic panel     Status: Abnormal   Collection Time: 01/26/20  4:12 PM  Result Value Ref Range   Sodium 139 135 - 145 mmol/L   Potassium 3.6 3.5 - 5.1 mmol/L   Chloride 97 (L) 98 - 111 mmol/L   CO2 31 22 - 32 mmol/L   Glucose, Bld 112 (H) 70 - 99 mg/dL    Comment: Glucose reference range applies only to samples taken after fasting for at least 8 hours.   BUN 12 8 - 23 mg/dL   Creatinine, Ser 1.16 0.61 - 1.24 mg/dL   Calcium 9.2 8.9 - 10.3 mg/dL   Total Protein 7.2 6.5 - 8.1 g/dL   Albumin 3.7 3.5 - 5.0 g/dL   AST 42 (H) 15 - 41 U/L   ALT 28 0 - 44 U/L   Alkaline Phosphatase 70 38 -  126 U/L   Total Bilirubin 0.8 0.3 - 1.2 mg/dL   GFR, Estimated >60 >60 mL/min   Anion gap 11 5 - 15    Comment: Performed at [facility] [address] [place], [place] [address]  Urinalysis, Routine w reflex microscopic     Status: None   Collection Time: 01/26/20  6:44 PM  Result Value Ref Range   Color, Urine YELLOW YELLOW   APPearance CLEAR CLEAR   Specific Gravity, Urine 1.016 1.005 - 1.030   pH 6.0 5.0 - 8.0   Glucose, UA NEGATIVE NEGATIVE mg/dL   Hgb urine dipstick NEGATIVE NEGATIVE   Bilirubin Urine NEGATIVE NEGATIVE   Ketones, ur NEGATIVE NEGATIVE mg/dL   Protein, ur NEGATIVE NEGATIVE mg/dL   Nitrite NEGATIVE NEGATIVE   Leukocytes,Ua NEGATIVE NEGATIVE    Comment: Performed at [facility] [address] [place], [place] [address]  Respiratory Panel by RT PCR (Flu A&B, Covid) - Nasopharyngeal Swab     Status: None   Collection Time: 01/27/20 12:16 AM   Specimen: Nasopharyngeal Swab  Result Value Ref Range   SARS Coronavirus 2 by RT PCR NEGATIVE NEGATIVE    Comment: (NOTE) SARS-CoV-2 target nucleic acids are NOT DETECTED.  The SARS-CoV-2 RNA is generally detectable in upper respiratoy specimens during the acute phase of infection. The lowest concentration of SARS-CoV-2 viral copies this assay can detect is 131 copies/mL. A negative result does not preclude SARS-Cov-2 infection and should not be used as the sole basis for treatment or other patient management decisions. A negative result may occur with  improper specimen collection/handling, submission of specimen other than nasopharyngeal swab, presence of viral mutation(s) within the areas targeted by this assay, and inadequate number of viral copies (<131 copies/mL). A negative result must be combined with clinical observations, patient history, and epidemiological information. The expected result is Negative.  Fact Sheet for Patients:  PinkCheek.be  Fact Sheet for  Healthcare Providers:  GravelBags.it  This test is no t yet approved or cleared by the [place] FDA and  has been authorized for detection and/or diagnosis of SARS-CoV-2 by FDA under an Emergency Use Authorization (EUA). This EUA will remain  in effect (meaning this test can be used) for the duration of the COVID-19 declaration under Section 564(b)(1) of the  Act, 21 U.S.C. section 360bbb-3(b)(1), unless the authorization is terminated or revoked sooner.     Influenza A by PCR NEGATIVE NEGATIVE   Influenza B by PCR NEGATIVE NEGATIVE    Comment: (NOTE) The Xpert Xpress SARS-CoV-2/FLU/RSV assay is intended as an aid in  the diagnosis of influenza from Nasopharyngeal swab specimens and  should not be used as a sole basis for treatment. Nasal washings and  aspirates are unacceptable for Xpert Xpress SARS-CoV-2/FLU/RSV  testing.  Fact Sheet for Patients: PinkCheek.be  Fact Sheet for Healthcare Providers: GravelBags.it  This test is not yet approved or cleared by the [place] FDA and  has been authorized for detection and/or diagnosis of SARS-CoV-2 by  FDA under an Emergency Use Authorization (EUA). This EUA will remain  in effect (meaning this test can be used) for the duration of the  Covid-19 declaration under Section 564(b)(1) of the Act, 21  U.S.C. section 360bbb-3(b)(1), unless the authorization is  terminated or revoked. Performed at [facility], [place] [address], [place], [place] [address]   Brain natriuretic peptide     Status: None   Collection Time: 01/27/20 12:15 PM  Result Value Ref Range   B Natriuretic Peptide 24.1 0.0 - 100.0 pg/mL    Comment: Performed at [facility] [address], [place], [place] [address]  MRSA PCR Screening     Status: None   Collection Time: 01/27/20  6:46 PM   Specimen: Nasal Mucosa; Nasopharyngeal  Result Value Ref Range   MRSA by  PCR NEGATIVE NEGATIVE    Comment:        The GeneXpert MRSA Assay (FDA approved for NASAL specimens only), is one component of a comprehensive MRSA colonization surveillance program. It is not intended to diagnose MRSA infection nor to guide or monitor treatment for MRSA infections. Performed at [facility], [place] [address], [place], [place] [address]   Renal function panel     Status: Abnormal   Collection Time: 01/28/20  1:04 AM  Result Value Ref Range   Sodium 135 135 - 145 mmol/L   Potassium 3.0 (L) 3.5 - 5.1 mmol/L   Chloride 92 (L) 98 - 111 mmol/L   CO2 32 22 - 32 mmol/L   Glucose, Bld 118 (H) 70 - 99 mg/dL    Comment: Glucose reference range applies only to samples taken after fasting for at least 8 hours.   BUN 17 8 - 23 mg/dL   Creatinine, Ser 1.08 0.61 - 1.24 mg/dL   Calcium 9.4 8.9 - 10.3 mg/dL   Phosphorus 4.4 2.5 - 4.6 mg/dL   Albumin 3.7 3.5 - 5.0 g/dL   GFR, Estimated >60 >60 mL/min   Anion gap 11 5 - 15    Comment: Performed at [facility] [address], [place], [place] [address]  Magnesium     Status: None   Collection Time: 01/28/20  1:04 AM  Result Value Ref Range   Magnesium 2.2 1.7 - 2.4 mg/dL    Comment: Performed at [facility] [address] [place], [place] [address]  Lipid panel     Status: Abnormal   Collection Time: 01/28/20  1:04 AM  Result Value Ref Range   Cholesterol 130 0 - 200 mg/dL   Triglycerides 100 <150 mg/dL   HDL 37 (L) >40 mg/dL   Total CHOL/HDL Ratio 3.5 RATIO   VLDL 20 0 - 40 mg/dL   LDL Cholesterol 73 0 -  99 mg/dL    Comment:        Total Cholesterol/HDL:CHD Risk Coronary Heart Disease Risk Table                     Men   Women  1/2 Average Risk   3.4   3.3  Average Risk       5.0   4.4  2 X Average Risk   9.6   7.1  3 X Average Risk  23.4   11.0        Use the calculated Patient Ratio above and the CHD Risk Table to determine the patient's CHD Risk.        ATP III CLASSIFICATION (LDL):   <100     mg/dL   Optimal  100-129  mg/dL   Near or Above                    Optimal  130-159  mg/dL   Borderline  160-189  mg/dL   High  >190     mg/dL   Very High Performed at [facility] [address] [place], [place] [address]   CBC     Status: Abnormal   Collection Time: 01/28/20  1:04 AM  Result Value Ref Range   WBC 6.7 4.0 - 10.5 K/uL   RBC 4.75 4.22 - 5.81 MIL/uL   Hemoglobin 11.8 (L) 13.0 - 17.0 g/dL   HCT 37.8 (L) 39 - 52 %   MCV 79.6 (L) 80.0 - 100.0 fL   MCH 24.8 (L) 26.0 - 34.0 pg   MCHC 31.2 30.0 - 36.0 g/dL   RDW 15.8 (H) 11.5 - 15.5 %   Platelets 146 (L) 150 - 400 K/uL   nRBC 0.0 0.0 - 0.2 %    Comment: Performed at [facility] [address] [place], [place] [address]    ECG   N/A  Telemetry   Sinus rhythm with PVC's - Personally Reviewed  Radiology    DG Chest 2 View  Result Date: 01/26/2020 CLINICAL DATA:  Shortness of breath EXAM: CHEST - 2 VIEW COMPARISON:  09/25/2019 chest radiograph and prior. FINDINGS: Bibasilar opacities, likely atelectasis. No pneumothorax or pleural effusion. Cardiomediastinal silhouette within normal limits. No acute osseous abnormality. Mild multilevel spondylosis. IMPRESSION: Bibasilar opacities, likely atelectasis. Electronically Signed   By: Primitivo Gauze M.D.   On: 01/26/2020 16:41    Cardiac Studies   N/A  Assessment   1. Principal Problem: 2.   Acute on chronic right-sided heart failure ([place]) 3. Active Problems: 4.   Benign essential HTN 5.   Atrial fibrillation ([place]) 6.   Acute on chronic right heart failure ([place]) 7.   Plan   1. Good diuresis overnight with close to 2L negative- BNP is low, which makes me wonder if this is right heart failure or just chronic venous insufficiency - I think it is more of the latter. Agree with LE ABI's - if normal, then proceed with compression wraps. Would consider outpatient venous insufficiency studies, however, he is likely to have deep vein reflux  given his obesity which is not typically amenable to venous ablation.  Time Spent Directly with Patient:  I have spent a total of 25 minutes with the patient reviewing hospital notes, telemetry, EKGs, labs and examining the patient as well as establishing an assessment and plan that was discussed personally with the patient.  > 50% of time was spent in direct  patient care.  Length of Stay:  LOS: 1 day   Pixie Casino, MD, [facility], [place] Director of the Advanced Lipid Disorders &  Cardiovascular Risk Reduction Clinic Diplomate of the American Board of Clinical Lipidology Attending Cardiologist  Direct Dial: [phone]  Fax: [phone]  Website:  [email or web address] 01/28/2020, 9:17 AM

## 2020-01-29 DIAGNOSIS — G4733 Obstructive sleep apnea (adult) (pediatric): Secondary | ICD-10-CM | POA: Diagnosis not present

## 2020-01-29 DIAGNOSIS — I50813 Acute on chronic right heart failure: Secondary | ICD-10-CM | POA: Diagnosis not present

## 2020-01-29 DIAGNOSIS — I480 Paroxysmal atrial fibrillation: Secondary | ICD-10-CM | POA: Diagnosis not present

## 2020-01-29 LAB — RENAL FUNCTION PANEL
Albumin: 3.9 g/dL (ref 3.5–5.0)
Anion gap: 14 (ref 5–15)
BUN: 18 mg/dL (ref 8–23)
CO2: 31 mmol/L (ref 22–32)
Calcium: 9.7 mg/dL (ref 8.9–10.3)
Chloride: 90 mmol/L — ABNORMAL LOW (ref 98–111)
Creatinine, Ser: 1.02 mg/dL (ref 0.61–1.24)
GFR, Estimated: >60 mL/min (ref >60)
Glucose, Bld: 113 mg/dL — ABNORMAL HIGH (ref 70–99)
Phosphorus: 3.6 mg/dL (ref 2.5–4.6)
Potassium: 3.3 mmol/L — ABNORMAL LOW (ref 3.5–5.1)
Sodium: 135 mmol/L (ref 135–145)

## 2020-01-29 LAB — HEMOGLOBIN A1C
Hgb A1c MFr Bld: 5.2 % (ref 4.8–5.6)
Mean Plasma Glucose: 103 mg/dL

## 2020-01-29 LAB — MAGNESIUM: Magnesium: 2.3 mg/dL (ref 1.7–2.4)

## 2020-01-29 MED ORDER — METOPROLOL TARTRATE 50 MG PO TABS
50.0000 mg | ORAL_TABLET | Freq: Two times a day (BID) | ORAL | Status: DC
  Administered 2020-01-29 – 2020-02-09 (×20): 50 mg via ORAL
  Filled 2020-01-29 (×23): qty 1

## 2020-01-29 MED ORDER — BACLOFEN 10 MG PO TABS
5.0000 mg | ORAL_TABLET | Freq: Three times a day (TID) | ORAL | Status: AC
  Administered 2020-01-29 – 2020-01-30 (×3): 5 mg via ORAL
  Filled 2020-01-29 (×3): qty 1

## 2020-01-29 MED ORDER — POTASSIUM CHLORIDE CRYS ER 20 MEQ PO TBCR
40.0000 meq | EXTENDED_RELEASE_TABLET | ORAL | Status: AC
  Administered 2020-01-29 (×3): 40 meq via ORAL
  Filled 2020-01-29 (×3): qty 2

## 2020-01-29 NOTE — Progress Notes (Signed)
PROGRESS NOTE  Charles Gray ERX:[id number] DOB: [birth year]   PCP: Enid Skeens., MD  Patient is from: Home  DOA: 01/26/2020 LOS: 2  Chief complaints: Leg swelling  Brief Narrative / Interim history: 64 year old man with history of paroxysmal A. fib on Eliquis, probable chronic right heart failure, hypertension, possible cirrhosis, OSA and PE/DVT in 05/2019 direct up to ED by his cardiologist for refractory bilateral lower extremity edema and weight gain.   In ED, hemodynamically stable.  95% on room air.  CXR with bibasilar opacities.  Basic labs without significant finding other than thrombocytopenia to 130.  Started on IV Lasix and admitted for fluid overload/bilateral lower extremity edema and concern for acute on chronic right heart failure.  The next day, cardiology consulted, and recommended IV Lasix with metolazone.  ABI ordered.  Subjective: Seen and examined earlier this morning.  No major events overnight of this morning.  Still with significant edema, lower extremity pain, blisters and bullae.  No shortness of breath but felt waking up gasping overnight.  He also felt sharp pain in his mid back with deep breathing.  No GI or UTI symptoms.  Excellent urine output.  Renal function stable.  Objective: Vitals:   01/29/20 0134 01/29/20 0500 01/29/20 0740 01/29/20 1144  BP: (!) 147/102 (!) 165/109 (!) 158/87 (!) 157/94  Pulse: 86 84 90 95  Resp: 13 15 10 20   Temp: 98.5 F (36.9 C) (!) 97.4 F (36.3 C) (!) 97.5 F (36.4 C) 99 F (37.2 C)  TempSrc: Oral Oral Oral Oral  SpO2: 98% 95% 94% 95%  Weight:  (!) 167.2 kg    Height:        Intake/Output Summary (Last 24 hours) at 01/29/2020 1513 Last data filed at 01/29/2020 1144 Gross per 24 hour  Intake 720 ml  Output 3350 ml  Net -2630 ml   Filed Weights   01/27/20 1838 01/28/20 0359 01/29/20 0500  Weight: (!) 169.8 kg (!) 169.4 kg (!) 167.2 kg    Examination:  GENERAL: No apparent distress.  Nontoxic. HEENT:  MMM.  Vision and hearing grossly intact.  NECK: Supple.  No apparent JVD.  RESP: On room air.  No IWOB.  Fair aeration bilaterally. CVS:  RRR. Heart sounds normal.  ABD/GI/GU: BS+. Abd soft, NTND.  MSK/EXT:  Moves extremities. No apparent deformity.  1+ pitting edema.  Blisters and bulla over LLE SKIN: Skin erythema in BLE.  No increased warmth to touch. NEURO: Awake, alert and oriented appropriately.  No apparent focal neuro deficit. PSYCH: Calm. Normal affect.  Procedures:  None  Microbiology summarized: COVID-19 PCR negative. Influenza PCR negative.  Assessment & Plan: Fluid overload/bilateral lower extremity edema due to chronic venous insufficiency versus right-sided heart failure-he has marked bilateral lower extremity edema with weeping blisters that did not respond to adjustment of his diuretics at home.  He was sent to ED by his cardiologist for IV diuretics.  CXR with bibasilar opacities but poor quality due to body habitus. He was also started on Cardizem for A. fib which could have contributed.  TTE in 07/2019 with EF of 55 to 60%, G1-DD. RVSF was not well-visualized. RHC at the same time reassuring.  BNP within normal arguing against heart failure exacerbation.  Excellent response to IV Lasix, about 2.9 L / 24 hours, net -4.5 L.  BP and renal function stable.  Still with significant BLE edema, blisters and pain. -Continue IV Lasix 80 mg twice daily, metolazone 2.5 mg daily and Aldactone 25 mg  daily per cardiology -Follow ABI. If okay will, try wrapping or unna boot.  -He would benefit from outpatient venous insufficiency studies. -Leg elevation, fluid and sodium restriction -Monitor fluid status and renal functions.  Paroxysmal atrial fibrillation: Rate controlled and in sinus rhythm.  Was on Cardizem -Continue Eliquis for anticoagulation. -Rate control choice per cardiology  OSA -Nightly CPAP  History of DVT/PE -Continue Eliquis  Essential hypertension:  Normotensive -Diuretics as above.  Chronic pain syndrome: Stable -Continue home Suboxone and gabapentin  Thrombocytopenia: Platelet 133> 146.  The same on 12/26/2019.  -Continue monitoring  Hypokalemia: K3.3 likely due to diuretics.  Magnesium was normal. -K-Dur 40 mEq x 3  Morbid obesity Body mass index is 38.54 kg/m.         DVT prophylaxis:   apixaban (ELIQUIS) tablet 5 mg    Code Status: Full code Family Communication: Patient and/or RN. Available if any question.  Status is: Inpatient  Remains inpatient appropriate because:Persistent severe electrolyte disturbances, IV treatments appropriate due to intensity of illness or inability to take PO and Inpatient level of care appropriate due to severity of illness   Dispo: The patient is from: Home              Anticipated d/c is to: Home              Anticipated d/c date is: 2 days              Patient currently is not medically stable to d/c.            Consultants:  Cardiology-following   Sch Meds:  Scheduled Meds: . apixaban  5 mg Oral BID  . buprenorphine-naloxone  1 tablet Sublingual BID  . furosemide  80 mg Intravenous BID  . gabapentin  800 mg Oral TID  . pantoprazole  40 mg Oral BID  . potassium chloride  40 mEq Oral Q4H  . pravastatin  20 mg Oral q1800  . sodium chloride flush  3 mL Intravenous Q12H  . spironolactone  25 mg Oral Daily   Continuous Infusions: . sodium chloride     PRN Meds:.sodium chloride, acetaminophen, ondansetron (ZOFRAN) IV, polyethylene glycol, sodium chloride flush, zolpidem  Antimicrobials: Anti-infectives (From admission, onward)   None       I have personally reviewed the following labs and images: CBC: Recent Labs  Lab 01/26/20 1612 01/28/20 0104  WBC 7.6 6.7  NEUTROABS 6.1  --   HGB 11.7* 11.8*  HCT 40.0 37.8*  MCV 83.7 79.6*  PLT 133* 146*   BMP &GFR Recent Labs  Lab 01/26/20 1612 01/28/20 0104 01/29/20 0110  NA 139 135 135  K 3.6 3.0*  3.3*  CL 97* 92* 90*  CO2 31 32 31  GLUCOSE 112* 118* 113*  BUN 12 17 18   CREATININE 1.16 1.08 1.02  CALCIUM 9.2 9.4 9.7  MG  --  2.2 2.3  PHOS  --  4.4 3.6   Estimated Creatinine Clearance: 131.6 mL/min (by C-G formula based on SCr of 1.02 mg/dL). Liver & Pancreas: Recent Labs  Lab 01/26/20 1612 01/28/20 0104 01/29/20 0110  AST 42*  --   --   ALT 28  --   --   ALKPHOS 70  --   --   BILITOT 0.8  --   --   PROT 7.2  --   --   ALBUMIN 3.7 3.7 3.9   No results for input(s): LIPASE, AMYLASE in the last 168 hours. No results for  input(s): AMMONIA in the last 168 hours. Diabetic: Recent Labs    01/28/20 0104  HGBA1C 5.2   No results for input(s): GLUCAP in the last 168 hours. Cardiac Enzymes: No results for input(s): CKTOTAL, CKMB, CKMBINDEX, TROPONINI in the last 168 hours. Recent Labs    02/27/19 1106  PROBNP 37   Coagulation Profile: No results for input(s): INR, PROTIME in the last 168 hours. Thyroid Function Tests: No results for input(s): TSH, T4TOTAL, FREET4, T3FREE, THYROIDAB in the last 72 hours. Lipid Profile: Recent Labs    01/28/20 0104  CHOL 130  HDL 37*  LDLCALC 73  TRIG 100  CHOLHDL 3.5   Anemia Panel: No results for input(s): VITAMINB12, FOLATE, FERRITIN, TIBC, IRON, RETICCTPCT in the last 72 hours. Urine analysis:    Component Value Date/Time   COLORURINE YELLOW 01/26/2020 1844   APPEARANCEUR CLEAR 01/26/2020 1844   LABSPEC 1.016 01/26/2020 1844   PHURINE 6.0 01/26/2020 1844   GLUCOSEU NEGATIVE 01/26/2020 1844   HGBUR NEGATIVE 01/26/2020 1844   BILIRUBINUR NEGATIVE 01/26/2020 1844   KETONESUR NEGATIVE 01/26/2020 1844   PROTEINUR NEGATIVE 01/26/2020 1844   NITRITE NEGATIVE 01/26/2020 1844   LEUKOCYTESUR NEGATIVE 01/26/2020 1844   Sepsis Labs: Invalid input(s): PROCALCITONIN, [place]  Microbiology: Recent Results (from the past 240 hour(s))  Respiratory Panel by RT PCR (Flu A&B, Covid) - Nasopharyngeal Swab     Status: None    Collection Time: 01/27/20 12:16 AM   Specimen: Nasopharyngeal Swab  Result Value Ref Range Status   SARS Coronavirus 2 by RT PCR NEGATIVE NEGATIVE Final    Comment: (NOTE) SARS-CoV-2 target nucleic acids are NOT DETECTED.  The SARS-CoV-2 RNA is generally detectable in upper respiratoy specimens during the acute phase of infection. The lowest concentration of SARS-CoV-2 viral copies this assay can detect is 131 copies/mL. A negative result does not preclude SARS-Cov-2 infection and should not be used as the sole basis for treatment or other patient management decisions. A negative result may occur with  improper specimen collection/handling, submission of specimen other than nasopharyngeal swab, presence of viral mutation(s) within the areas targeted by this assay, and inadequate number of viral copies (<131 copies/mL). A negative result must be combined with clinical observations, patient history, and epidemiological information. The expected result is Negative.  Fact Sheet for Patients:  PinkCheek.be  Fact Sheet for Healthcare Providers:  GravelBags.it  This test is no t yet approved or cleared by the [place] FDA and  has been authorized for detection and/or diagnosis of SARS-CoV-2 by FDA under an Emergency Use Authorization (EUA). This EUA will remain  in effect (meaning this test can be used) for the duration of the COVID-19 declaration under Section 564(b)(1) of the Act, 21 U.S.C. section 360bbb-3(b)(1), unless the authorization is terminated or revoked sooner.     Influenza A by PCR NEGATIVE NEGATIVE Final   Influenza B by PCR NEGATIVE NEGATIVE Final    Comment: (NOTE) The Xpert Xpress SARS-CoV-2/FLU/RSV assay is intended as an aid in  the diagnosis of influenza from Nasopharyngeal swab specimens and  should not be used as a sole basis for treatment. Nasal washings and  aspirates are unacceptable for Xpert  Xpress SARS-CoV-2/FLU/RSV  testing.  Fact Sheet for Patients: PinkCheek.be  Fact Sheet for Healthcare Providers: GravelBags.it  This test is not yet approved or cleared by the [place] FDA and  has been authorized for detection and/or diagnosis of SARS-CoV-2 by  FDA under an Emergency Use Authorization (EUA). This EUA will remain  in effect (meaning this test can be used) for the duration of the  Covid-19 declaration under Section 564(b)(1) of the Act, 21  U.S.C. section 360bbb-3(b)(1), unless the authorization is  terminated or revoked. Performed at [facility], [place] [address], [place], [place] [address]   MRSA PCR Screening     Status: None   Collection Time: 01/27/20  6:46 PM   Specimen: Nasal Mucosa; Nasopharyngeal  Result Value Ref Range Status   MRSA by PCR NEGATIVE NEGATIVE Final    Comment:        The GeneXpert MRSA Assay (FDA approved for NASAL specimens only), is one component of a comprehensive MRSA colonization surveillance program. It is not intended to diagnose MRSA infection nor to guide or monitor treatment for MRSA infections. Performed at [facility], [place] [address], [place], [place] [address]     Radiology Studies: No results found.   Aahan Marques T. [place]  If 7PM-7AM, please contact night-coverage [email or web address] 01/29/2020, 3:13 PM

## 2020-01-29 NOTE — Progress Notes (Signed)
Progress Note  Patient Name: Charles Gray Date of Encounter: 01/29/2020  [facility] Cardiologist: Berniece Salines, DO   Subjective   Denies CP   Still SOB  Legs sore   Inpatient Medications    Scheduled Meds:  apixaban  5 mg Oral BID   buprenorphine-naloxone  1 tablet Sublingual BID   furosemide  80 mg Intravenous BID   gabapentin  800 mg Oral TID   pantoprazole  40 mg Oral BID   potassium chloride  40 mEq Oral Q4H   pravastatin  20 mg Oral q1800   sodium chloride flush  3 mL Intravenous Q12H   spironolactone  25 mg Oral Daily   Continuous Infusions:  sodium chloride     PRN Meds: sodium chloride, acetaminophen, ondansetron (ZOFRAN) IV, polyethylene glycol, sodium chloride flush, zolpidem   Vital Signs    Vitals:   01/28/20 1627 01/28/20 2008 01/29/20 0134 01/29/20 0500  BP: 140/88 (!) 136/104 (!) 147/102 (!) 165/109  Pulse: 79 90 86 84  Resp: 13 13 13 15   Temp: 98.1 F (36.7 C) 98 F (36.7 C) 98.5 F (36.9 C) (!) 97.4 F (36.3 C)  TempSrc: Oral Oral Oral Oral  SpO2: 96% 96% 98% 95%  Weight:    (!) 167.2 kg  Height:        Intake/Output Summary (Last 24 hours) at 01/29/2020 0714 Last data filed at 01/29/2020 0500 Gross per 24 hour  Intake 240 ml  Output 2900 ml  Net -2660 ml   Net neg 4.5 L   Last 3 Weights 01/29/2020 01/28/2020 01/27/2020  Weight (lbs) 368 lb 9.8 oz 373 lb 8 oz 374 lb 5.5 oz  Weight (kg) 167.2 kg 169.418 kg 169.8 kg      Telemetry    SR  - Personally Reviewed  ECG    No new EKG done  - Personally Reviewed  Physical Exam   GEN: Morbidly obese 64 yo in no acute distress.   Neck: Neck is full   Cardiac: RRR, no murmurs Respiratory: Clear to auscultation bilaterally. GI: Soft, nontender, non-distended  MS: 1+ edema  Chronic changes to skin in legs from swellling ; No deformity. Neuro:  Nonfocal  Psych: Normal affect   Labs    High Sensitivity Troponin:  No results for input(s): TROPONINIHS in the last  720 hours.    Chemistry Recent Labs  Lab 01/26/20 1612 01/28/20 0104 01/29/20 0110  NA 139 135 135  K 3.6 3.0* 3.3*  CL 97* 92* 90*  CO2 31 32 31  GLUCOSE 112* 118* 113*  BUN 12 17 18   CREATININE 1.16 1.08 1.02  CALCIUM 9.2 9.4 9.7  PROT 7.2  --   --   ALBUMIN 3.7 3.7 3.9  AST 42*  --   --   ALT 28  --   --   ALKPHOS 70  --   --   BILITOT 0.8  --   --   GFRNONAA >60 >60 >60  ANIONGAP 11 11 14      Hematology Recent Labs  Lab 01/26/20 1612 01/28/20 0104  WBC 7.6 6.7  RBC 4.78 4.75  HGB 11.7* 11.8*  HCT 40.0 37.8*  MCV 83.7 79.6*  MCH 24.5* 24.8*  MCHC 29.3* 31.2  RDW 16.0* 15.8*  PLT 133* 146*    BNP Recent Labs  Lab 01/27/20 1215  BNP 24.1     DDimer No results for input(s): DDIMER in the last 168 hours.   Radiology    No  results found.  Cardiac Studies   R heart cath 07/2019 RHC Procedural Findings: Hemodynamics (mmHg) RA mean 8 RV 21/8 PA 20/8, mean 14 PCWP mean 11  Oxygen saturations: PA 71% AO 100%  Cardiac Output (Fick) 8.5  Cardiac Index (Fick) 2.8  Echo 1. Suboptimal image quality for diagnostic purposes. 2. Left ventricular ejection fraction, by estimation, is 55 to 60%. The left ventricle has normal function. Left ventricular endocardial border not optimally defined to evaluate regional wall motion. Left ventricular diastolic parameters are consistent with Grade I diastolic dysfunction (impaired relaxation). 3. Right ventricular systolic function was not well visualized. The right ventricular size is not well visualized. 4. The mitral valve is grossly normal. No evidence of mitral valve regurgitation. No evidence of mitral stenosis. 5. The aortic valve was not well visualized. Aortic valve regurgitation is not visualized. No aortic stenosis is present.    Patient Profile     64 y.o. male with h of chronic R sided CHF, CAD (mild on CT), HTN, pulmoonary emboil and atrial fbrillation admitted for edema  Assessment & Plan     1  Acute on chronic diastolic dysfunction    Pt is diuresing   I have reviewed echoes  VERY DIFFICULT/POOR acoustic windows, especially of RV, apical views in particular   In other views RV does not appear that bad  I discussed diet and salt restriction with patietn   I would treat aggerssively  I am not convinced of severe RV failure   ? Venous insuff in setting of salt excess   Would get dietary to see    Support hose     2  HTN  BP remains high   Off of diltiazem  Would Rx wit hb Blocker    3  Atrial fibrillation    Continue anticoagulation for PAF    Remains in SR   For questions or updates, please contact [facility] Please consult [email or web address] for contact info under        Signed, Dorris Carnes, MD  01/29/2020, 7:14 AM

## 2020-01-29 NOTE — Plan of Care (Signed)

## 2020-01-29 NOTE — Evaluation (Signed)
Occupational Therapy Evaluation and Discharge Patient Details Name: Charles Gray MRN: [id number] DOB: 1955-09-29 Today's Date: 01/29/2020    History of Present Illness Pt adm with acute on chronic rt heart failure. PMH - chf, htn, osa, PE/DVT, afib, obesity, chronic pain,    Clinical Impression   Pt is functioning modified independently in ADL and ADL transfers with RW. He fatigues easily and is limited by chronic back pain, but is knowledgeable in energy conservation strategies and importance of paced activity. No further OT needs.    Follow Up Recommendations  No OT follow up    Equipment Recommendations  None recommended by OT    Recommendations for Other Services       Precautions / Restrictions Precautions Precautions: Fall      Mobility Bed Mobility Overal bed mobility: Modified Independent             General bed mobility comments: HOB up  Transfers Overall transfer level: Modified independent Equipment used: Rolling walker (2 wheeled)                  Balance Overall balance assessment: Needs assistance   Sitting balance-Leahy Scale: Good       Standing balance-Leahy Scale: Fair Standing balance comment: statically                           ADL either performed or assessed with clinical judgement   ADL Overall ADL's : Modified independent                                             Vision Patient Visual Report: No change from baseline       Perception     Praxis      Pertinent Vitals/Pain Pain Assessment: Faces Faces Pain Scale: Hurts little more Pain Location: back Pain Descriptors / Indicators: Grimacing Pain Intervention(s): Repositioned;Monitored during session;Limited activity within patient's tolerance     Hand Dominance Right   Extremity/Trunk Assessment Upper Extremity Assessment Upper Extremity Assessment: Overall WFL for tasks assessed   Lower Extremity Assessment Lower  Extremity Assessment: Defer to PT evaluation   Cervical / Trunk Assessment Cervical / Trunk Assessment: Other exceptions (chronic back pain)   Communication Communication Communication: No difficulties   Cognition Arousal/Alertness: Awake/alert Behavior During Therapy: WFL for tasks assessed/performed Overall Cognitive Status: Within Functional Limits for tasks assessed                                     General Comments       Exercises     Shoulder Instructions      Home Living Family/patient expects to be discharged to:: Private residence Living Arrangements: Alone Available Help at Discharge: Family;Available PRN/intermittently Type of Home: Apartment Home Access: Stairs to enter Entrance Stairs-Number of Steps: 16 steps Entrance Stairs-Rails: Right;Left;Can reach both Home Layout: One level     Bathroom Shower/Tub: Teacher, early years/pre: Standard     Home Equipment: Tub bench;Walker - 2 wheels;Hand held shower head          Prior Functioning/Environment Level of Independence: Independent with assistive device(s)        Comments: Does not drive, cooks, 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), uses walker intermittently, reports difficulty  cleaning his home        OT Problem List:        OT Treatment/Interventions:      OT Goals(Current goals can be found in the care plan section) Acute Rehab OT Goals Patient Stated Goal: return home  OT Frequency:     Barriers to D/C:            Co-evaluation              AM-PAC OT "6 Clicks" Daily Activity     Outcome Measure Help from another person eating meals?: None Help from another person taking care of personal grooming?: None Help from another person toileting, which includes using toliet, bedpan, or urinal?: None Help from another person bathing (including washing, rinsing, drying)?: None Help from another person to put on and taking off regular upper body  clothing?: None Help from another person to put on and taking off regular lower body clothing?: None 6 Click Score: 24   End of Session Equipment Utilized During Treatment: Rolling walker Nurse Communication: Mobility status  Activity Tolerance: Patient tolerated treatment well Patient left: in bed;with call bell/phone within reach  OT Visit Diagnosis: Other abnormalities of gait and mobility (R26.89);Pain (decreased activity tolerance)                Time: 0149-9692 OT Time Calculation (min): 22 min Charges:  OT General Charges $OT Visit: 1 Visit OT Evaluation $OT Eval Low Complexity: [place], OTR/L Acute Rehabilitation Services Pager: [phone] Office: [phone]  Malka So 01/29/2020, 12:10 PM

## 2020-01-29 NOTE — Plan of Care (Signed)

## 2020-01-29 NOTE — Progress Notes (Signed)
Pt states he does not need cpap

## 2020-01-30 ENCOUNTER — Inpatient Hospital Stay (HOSPITAL_COMMUNITY): Payer: Medicare HMO

## 2020-01-30 DIAGNOSIS — R0989 Other specified symptoms and signs involving the circulatory and respiratory systems: Secondary | ICD-10-CM | POA: Diagnosis not present

## 2020-01-30 DIAGNOSIS — I480 Paroxysmal atrial fibrillation: Secondary | ICD-10-CM | POA: Diagnosis not present

## 2020-01-30 DIAGNOSIS — G4733 Obstructive sleep apnea (adult) (pediatric): Secondary | ICD-10-CM | POA: Diagnosis not present

## 2020-01-30 DIAGNOSIS — I50813 Acute on chronic right heart failure: Secondary | ICD-10-CM | POA: Diagnosis not present

## 2020-01-30 DIAGNOSIS — E873 Alkalosis: Secondary | ICD-10-CM

## 2020-01-30 DIAGNOSIS — E871 Hypo-osmolality and hyponatremia: Secondary | ICD-10-CM

## 2020-01-30 LAB — RENAL FUNCTION PANEL
Albumin: 3.9 g/dL (ref 3.5–5.0)
Anion gap: 12 (ref 5–15)
BUN: 20 mg/dL (ref 8–23)
CO2: 33 mmol/L — ABNORMAL HIGH (ref 22–32)
Calcium: 9.7 mg/dL (ref 8.9–10.3)
Chloride: 89 mmol/L — ABNORMAL LOW (ref 98–111)
Creatinine, Ser: 1.19 mg/dL (ref 0.61–1.24)
GFR, Estimated: >60 mL/min (ref >60)
Glucose, Bld: 135 mg/dL — ABNORMAL HIGH (ref 70–99)
Phosphorus: 3.7 mg/dL (ref 2.5–4.6)
Potassium: 3.3 mmol/L — ABNORMAL LOW (ref 3.5–5.1)
Sodium: 134 mmol/L — ABNORMAL LOW (ref 135–145)

## 2020-01-30 LAB — CBC
HCT: 42.4 % (ref 39.0–52.0)
Hemoglobin: 13.1 g/dL (ref 13.0–17.0)
MCH: 24.8 pg — ABNORMAL LOW (ref 26.0–34.0)
MCHC: 30.9 g/dL (ref 30.0–36.0)
MCV: 80.2 fL (ref 80.0–100.0)
Platelets: 168 10*3/uL (ref 150–400)
RBC: 5.29 MIL/uL (ref 4.22–5.81)
RDW: 15.8 % — ABNORMAL HIGH (ref 11.5–15.5)
WBC: 9.6 10*3/uL (ref 4.0–10.5)
nRBC: 0 % (ref 0.0–0.2)

## 2020-01-30 LAB — MAGNESIUM: Magnesium: 2.2 mg/dL (ref 1.7–2.4)

## 2020-01-30 MED ORDER — PRAVASTATIN SODIUM 40 MG PO TABS
20.0000 mg | ORAL_TABLET | Freq: Three times a day (TID) | ORAL | Status: DC
  Administered 2020-01-30 – 2020-02-03 (×12): 20 mg via ORAL
  Filled 2020-01-30 (×12): qty 1

## 2020-01-30 MED ORDER — POLYETHYLENE GLYCOL 3350 17 G PO PACK
17.0000 g | PACK | Freq: Two times a day (BID) | ORAL | Status: DC | PRN
  Administered 2020-01-31 – 2020-02-04 (×5): 17 g via ORAL
  Filled 2020-01-30 (×7): qty 1

## 2020-01-30 MED ORDER — POTASSIUM CHLORIDE CRYS ER 20 MEQ PO TBCR
40.0000 meq | EXTENDED_RELEASE_TABLET | ORAL | Status: AC
  Administered 2020-01-30 (×3): 40 meq via ORAL
  Filled 2020-01-30 (×3): qty 2

## 2020-01-30 MED ORDER — SENNOSIDES-DOCUSATE SODIUM 8.6-50 MG PO TABS
1.0000 | ORAL_TABLET | Freq: Two times a day (BID) | ORAL | Status: DC | PRN
  Administered 2020-01-30 – 2020-02-03 (×4): 1 via ORAL
  Filled 2020-01-30 (×4): qty 1

## 2020-01-30 MED ORDER — FUROSEMIDE 10 MG/ML IJ SOLN: 80.0000 mg | Freq: Three times a day (TID) | INTRAMUSCULAR | Status: DC

## 2020-01-30 MED ORDER — PHENYLEPHRINE HCL-NACL 10-0.9 MG/250ML-% IV SOLN
INTRAVENOUS | Status: AC
  Filled 2020-01-30: qty 250

## 2020-01-30 MED ORDER — FUROSEMIDE 10 MG/ML IJ SOLN
80.0000 mg | Freq: Three times a day (TID) | INTRAMUSCULAR | Status: DC
  Administered 2020-01-30 – 2020-02-05 (×21): 80 mg via INTRAVENOUS
  Filled 2020-01-30 (×20): qty 8

## 2020-01-30 MED ORDER — METOLAZONE 2.5 MG PO TABS
2.5000 mg | ORAL_TABLET | Freq: Once | ORAL | Status: AC
  Administered 2020-01-30: 2.5 mg via ORAL
  Filled 2020-01-30: qty 1

## 2020-01-30 NOTE — Progress Notes (Signed)
Orthopedic Tech Progress Note Patient Details:  Charles Gray 03/19/1956 [id number] Patient stated" he might remove UNNA BOOTS within a few hours".. told him to at least notify RN 1st. Ortho Devices Type of Ortho Device: Haematologist Ortho Device/Splint Location: BLE Ortho Device/Splint Interventions: Ordered, Application   Post Interventions Patient Tolerated: Well Instructions Provided: Care of [place] 01/30/2020, 6:31 PM

## 2020-01-30 NOTE — Progress Notes (Signed)
PROGRESS NOTE  Charles Gray TLX:[id number] DOB: 1955-05-27   PCP: Enid Skeens., MD  Patient is from: Home  DOA: 01/26/2020 LOS: 3  Chief complaints: Leg swelling  Brief Narrative / Interim history: 64 year old man with history of paroxysmal A. fib on Eliquis, probable chronic right heart failure, hypertension, possible cirrhosis, OSA and PE/DVT in 05/2019 direct up to ED by his cardiologist for refractory bilateral lower extremity edema and weight gain.   In ED, hemodynamically stable.  95% on room air.  CXR with bibasilar opacities.  Basic labs without significant finding other than thrombocytopenia to 130.  Started on IV Lasix and admitted for fluid overload/bilateral lower extremity edema and concern for acute on chronic right heart failure.  The next day, cardiology consulted, and recommended IV Lasix with metolazone.  ABI ordered.  Subjective: Seen and examined earlier this morning. No major events overnight of this morning. Still with bilateral lower extremity edema. He also reports constipation. Denies chest pain or dyspnea. Denies UTI symptoms.  Objective: Vitals:   01/29/20 2019 01/29/20 2342 01/30/20 0620 01/30/20 0700  BP: (!) 144/93 130/90 113/81 131/85  Pulse: 85 75 65 73  Resp: 17 17 16 16   Temp: 99.1 F (37.3 C) 98.8 F (37.1 C) 98.4 F (36.9 C) 98.3 F (36.8 C)  TempSrc: Oral Oral Oral Oral  SpO2: 95% 98% 95%   Weight:  (!) 168 kg (!) 168 kg   Height:        Intake/Output Summary (Last 24 hours) at 01/30/2020 1130 Last data filed at 01/29/2020 1900 Gross per 24 hour  Intake 840 ml  Output 2050 ml  Net -1210 ml   Filed Weights   01/29/20 0500 01/29/20 2342 01/30/20 0620  Weight: (!) 167.2 kg (!) 168 kg (!) 168 kg    Examination:  GENERAL: No apparent distress.  Nontoxic. HEENT: MMM.  Vision and hearing grossly intact.  NECK: Supple.  No apparent JVD but difficult exam due to body habitus.  RESP: On room air. No IWOB.  Fair aeration  bilaterally. CVS:  RRR. Heart sounds normal.  ABD/GI/GU: BS+. Abd soft, NTND.  MSK/EXT:  Moves extremities. No apparent deformity. 1+ pitting edema with blisters and bulla over LLE. SKIN: BLE erythema with edema, blisters and bulla. NEURO: Awake, alert and oriented appropriately.  No apparent focal neuro deficit. PSYCH: Calm. Normal affect.  Procedures:  None  Microbiology summarized: COVID-19 PCR negative. Influenza PCR negative.  Assessment & Plan: Fluid overload/bilateral lower extremity edema due to chronic venous insufficiency versus right-sided heart failure-he has marked BLE edema with blisters and bulla that did not respond to adjustment of his diuretics at home.  He was sent to ED by his cardiologist for IV diuretics.  CXR with bibasilar opacities but poor quality due to body habitus. He was also started on Cardizem for A. fib which could have contributed.  TTE in 07/2019 with EF of 55 to 60%, G1-DD. RVSF was not well-visualized. RHC at the same time reassuring.  BNP within normal arguing against heart failure exacerbation.  Excellent response to IV Lasix, 2 L UOP/24 hours with net -5.4 L. Mild alkalosis and uptrending creatinine. Still with significant BLE edema, blisters, bulla and pain. -On IV Lasix 80 mg twice daily, metolazone 2.5 mg daily and Aldactone 25 mg daily per cardiology -May need to back off on diuretics given alkalosis and slight uptrend in creatinine. -Follow ABI. If okay will, try wrapping or unna boot.  -He would benefit from outpatient venous insufficiency studies. -  Leg elevation, fluid and sodium restriction -Monitor fluid status and renal functions. -Appreciate teaching by dietitian about heart healthy/low-sodium diet  Paroxysmal atrial fibrillation: Rate controlled and in sinus rhythm.  Was on Cardizem -Continue Eliquis for anticoagulation. -Started on metoprolol  OSA on CPAP -Refusing CPAP.  History of DVT/PE -Continue Eliquis  Essential  hypertension: Normotensive -Now on metoprolol and diuretics as above  Chronic pain syndrome: Stable -Continue home Suboxone and gabapentin  Thrombocytopenia: Platelet 133> 146> 168. Resolved. -Continue monitoring  Hypokalemia: K3.3 likely due to diuretics.  Magnesium was normal. -K-Dur 40 mEq x 3  Mild hyponatremia: Na 134. Due to metolazone and Aldactone? -Continue monitoring  Morbid obesity Body mass index is 38.73 kg/m.         DVT prophylaxis:   apixaban (ELIQUIS) tablet 5 mg    Code Status: Full code Family Communication: Patient and/or RN. Available if any question.  Status is: Inpatient  Remains inpatient appropriate because:Persistent severe electrolyte disturbances, IV treatments appropriate due to intensity of illness or inability to take PO and Inpatient level of care appropriate due to severity of illness   Dispo: The patient is from: Home              Anticipated d/c is to: Home              Anticipated d/c date is: 2 days or when cleared by cardiology              Patient currently is not medically stable to d/c.            Consultants:  Cardiology-following   Sch Meds:  Scheduled Meds: . apixaban  5 mg Oral BID  . buprenorphine-naloxone  1 tablet Sublingual BID  . furosemide  80 mg Intravenous TID  . gabapentin  800 mg Oral TID  . metoprolol tartrate  50 mg Oral BID  . pantoprazole  40 mg Oral BID  . potassium chloride  40 mEq Oral Q4H  . pravastatin  20 mg Oral TID  . sodium chloride flush  3 mL Intravenous Q12H  . spironolactone  25 mg Oral Daily   Continuous Infusions: . sodium chloride     PRN Meds:.sodium chloride, acetaminophen, ondansetron (ZOFRAN) IV, polyethylene glycol, senna-docusate, sodium chloride flush, zolpidem  Antimicrobials: Anti-infectives (From admission, onward)   None       I have personally reviewed the following labs and images: CBC: Recent Labs  Lab 01/26/20 1612 01/28/20 0104 01/30/20 0048   WBC 7.6 6.7 9.6  NEUTROABS 6.1  --   --   HGB 11.7* 11.8* 13.1  HCT 40.0 37.8* 42.4  MCV 83.7 79.6* 80.2  PLT 133* 146* 168   BMP &GFR Recent Labs  Lab 01/26/20 1612 01/28/20 0104 01/29/20 0110 01/30/20 0048  NA 139 135 135 134*  K 3.6 3.0* 3.3* 3.3*  CL 97* 92* 90* 89*  CO2 31 32 31 33*  GLUCOSE 112* 118* 113* 135*  BUN 12 17 18 20   CREATININE 1.16 1.08 1.02 1.19  CALCIUM 9.2 9.4 9.7 9.7  MG  --  2.2 2.3 2.2  PHOS  --  4.4 3.6 3.7   Estimated Creatinine Clearance: 113.2 mL/min (by C-G formula based on SCr of 1.19 mg/dL). Liver & Pancreas: Recent Labs  Lab 01/26/20 1612 01/28/20 0104 01/29/20 0110 01/30/20 0048  AST 42*  --   --   --   ALT 28  --   --   --   ALKPHOS 70  --   --   --  BILITOT 0.8  --   --   --   PROT 7.2  --   --   --   ALBUMIN 3.7 3.7 3.9 3.9   No results for input(s): LIPASE, AMYLASE in the last 168 hours. No results for input(s): AMMONIA in the last 168 hours. Diabetic: Recent Labs    01/28/20 0104  HGBA1C 5.2   No results for input(s): GLUCAP in the last 168 hours. Cardiac Enzymes: No results for input(s): CKTOTAL, CKMB, CKMBINDEX, TROPONINI in the last 168 hours. Recent Labs    02/27/19 1106  PROBNP 37   Coagulation Profile: No results for input(s): INR, PROTIME in the last 168 hours. Thyroid Function Tests: No results for input(s): TSH, T4TOTAL, FREET4, T3FREE, THYROIDAB in the last 72 hours. Lipid Profile: Recent Labs    01/28/20 0104  CHOL 130  HDL 37*  LDLCALC 73  TRIG 100  CHOLHDL 3.5   Anemia Panel: No results for input(s): VITAMINB12, FOLATE, FERRITIN, TIBC, IRON, RETICCTPCT in the last 72 hours. Urine analysis:    Component Value Date/Time   COLORURINE YELLOW 01/26/2020 1844   APPEARANCEUR CLEAR 01/26/2020 1844   LABSPEC 1.016 01/26/2020 1844   PHURINE 6.0 01/26/2020 1844   GLUCOSEU NEGATIVE 01/26/2020 1844   HGBUR NEGATIVE 01/26/2020 1844   BILIRUBINUR NEGATIVE 01/26/2020 1844   KETONESUR NEGATIVE  01/26/2020 1844   PROTEINUR NEGATIVE 01/26/2020 1844   NITRITE NEGATIVE 01/26/2020 1844   LEUKOCYTESUR NEGATIVE 01/26/2020 1844   Sepsis Labs: Invalid input(s): PROCALCITONIN, [place]  Microbiology: Recent Results (from the past 240 hour(s))  Respiratory Panel by RT PCR (Flu A&B, Covid) - Nasopharyngeal Swab     Status: None   Collection Time: 01/27/20 12:16 AM   Specimen: Nasopharyngeal Swab  Result Value Ref Range Status   SARS Coronavirus 2 by RT PCR NEGATIVE NEGATIVE Final    Comment: (NOTE) SARS-CoV-2 target nucleic acids are NOT DETECTED.  The SARS-CoV-2 RNA is generally detectable in upper respiratoy specimens during the acute phase of infection. The lowest concentration of SARS-CoV-2 viral copies this assay can detect is 131 copies/mL. A negative result does not preclude SARS-Cov-2 infection and should not be used as the sole basis for treatment or other patient management decisions. A negative result may occur with  improper specimen collection/handling, submission of specimen other than nasopharyngeal swab, presence of viral mutation(s) within the areas targeted by this assay, and inadequate number of viral copies (<131 copies/mL). A negative result must be combined with clinical observations, patient history, and epidemiological information. The expected result is Negative.  Fact Sheet for Patients:  PinkCheek.be  Fact Sheet for Healthcare Providers:  GravelBags.it  This test is no t yet approved or cleared by the [place] FDA and  has been authorized for detection and/or diagnosis of SARS-CoV-2 by FDA under an Emergency Use Authorization (EUA). This EUA will remain  in effect (meaning this test can be used) for the duration of the COVID-19 declaration under Section 564(b)(1) of the Act, 21 U.S.C. section 360bbb-3(b)(1), unless the authorization is terminated or revoked sooner.     Influenza A  by PCR NEGATIVE NEGATIVE Final   Influenza B by PCR NEGATIVE NEGATIVE Final    Comment: (NOTE) The Xpert Xpress SARS-CoV-2/FLU/RSV assay is intended as an aid in  the diagnosis of influenza from Nasopharyngeal swab specimens and  should not be used as a sole basis for treatment. Nasal washings and  aspirates are unacceptable for Xpert Xpress SARS-CoV-2/FLU/RSV  testing.  Fact Sheet for Patients: PinkCheek.be  Fact Sheet for Healthcare Providers: GravelBags.it  This test is not yet approved or cleared by the [place] FDA and  has been authorized for detection and/or diagnosis of SARS-CoV-2 by  FDA under an Emergency Use Authorization (EUA). This EUA will remain  in effect (meaning this test can be used) for the duration of the  Covid-19 declaration under Section 564(b)(1) of the Act, 21  U.S.C. section 360bbb-3(b)(1), unless the authorization is  terminated or revoked. Performed at [facility], [place] [address], [place], [place] [address]   MRSA PCR Screening     Status: None   Collection Time: 01/27/20  6:46 PM   Specimen: Nasal Mucosa; Nasopharyngeal  Result Value Ref Range Status   MRSA by PCR NEGATIVE NEGATIVE Final    Comment:        The GeneXpert MRSA Assay (FDA approved for NASAL specimens only), is one component of a comprehensive MRSA colonization surveillance program. It is not intended to diagnose MRSA infection nor to guide or monitor treatment for MRSA infections. Performed at [facility], [place] [address] [place], [place] [address]     Radiology Studies: No results found.   Lawana Hartzell T. [place]  If 7PM-7AM, please contact night-coverage [email or web address] 01/30/2020, 11:30 AM

## 2020-01-30 NOTE — Progress Notes (Signed)
Physical Therapy Treatment Patient Details Name: Charles Gray MRN: [id number] DOB: [birth year] Today's Date: 01/30/2020    History of Present Illness Pt adm with acute on chronic rt heart failure. PMH - chf, htn, osa, PE/DVT, afib, obesity, chronic pain,     PT Comments    Pt finishing lunch on entry agreeable to taking a walk with therapy. Pt is limited in safe mobility by 4/4 DoE with long distance ambulation. Pt is min guard for transfers and supervision with ambulation using RW. With 250 ft ambulation pt requires 1x seated rest break. D/c plans remain appropriate. PT will continue to follow acutely.   Follow Up Recommendations  No PT follow up     Equipment Recommendations  None recommended by PT    Recommendations for Other Services       Precautions / Restrictions Precautions Precautions: Fall    Mobility  Bed Mobility               General bed mobility comments: Pt up in chair  Transfers Overall transfer level: Needs assistance Equipment used: Rolling walker (2 wheeled) Transfers: Sit to/from Stand Sit to Stand: Min guard         General transfer comment: Incr time and effort from low chair  Ambulation/Gait Ambulation/Gait assistance: Supervision Gait Distance (Feet): 250 Feet Assistive device: Rolling walker (2 wheeled) Gait Pattern/deviations: Step-through pattern;Decreased stride length;Trunk flexed;Wide base of support Gait velocity: decr   General Gait Details: 1x seated rest break, vc for proximity to RW and upright posture          Balance Overall balance assessment: Needs assistance Sitting-balance support: No upper extremity supported;Feet supported Sitting balance-Leahy Scale: Good     Standing balance support: No upper extremity supported;During functional activity Standing balance-Leahy Scale: Fair Standing balance comment: statically                            Cognition Arousal/Alertness: Awake/alert Behavior  During Therapy: WFL for tasks assessed/performed Overall Cognitive Status: Within Functional Limits for tasks assessed                                           General Comments General comments (skin integrity, edema, etc.): max noted HR with ambulation 110bpm,       Pertinent Vitals/Pain Pain Assessment: Faces Faces Pain Scale: Hurts a little bit Pain Location: back Pain Descriptors / Indicators: Grimacing Pain Intervention(s): Monitored during session    Home Living Family/patient expects to be discharged to:: Private residence Living Arrangements: Alone Available Help at Discharge: Family;Available PRN/intermittently Type of Home: Apartment Home Access: Stairs to enter Entrance Stairs-Rails: Right;Left;Can reach both Home Layout: One level Home Equipment: Tub bench;Walker - 2 wheels;Hand held shower head      Prior Function Level of Independence: Independent with assistive device(s)      Comments: Does not drive, cooks, 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), uses walker intermittently, reports difficulty cleaning his home   PT Goals (current goals can now be found in the care plan section) Acute Rehab PT Goals Patient Stated Goal: return home PT Goal Formulation: With patient Time For Goal Achievement: 02/11/20 Potential to Achieve Goals: Good    Frequency    Min 3X/week      PT Plan Current plan remains appropriate       AM-PAC PT "6  Clicks" Mobility   Outcome Measure  Help needed turning from your back to your side while in a flat bed without using bedrails?: None Help needed moving from lying on your back to sitting on the side of a flat bed without using bedrails?: None Help needed moving to and from a bed to a chair (including a wheelchair)?: A Little Help needed standing up from a chair using your arms (e.g., wheelchair or bedside chair)?: A Little Help needed to walk in hospital room?: A Little Help needed climbing 3-5  steps with a railing? : A Little 6 Click Score: 20    End of Session Equipment Utilized During Treatment: Gait belt Activity Tolerance: Patient limited by fatigue Patient left: in chair;with call bell/phone within reach Nurse Communication: Mobility status PT Visit Diagnosis: Other abnormalities of gait and mobility (R26.89);Muscle weakness (generalized) (M62.81)     Time: 0626-9485 PT Time Calculation (min) (ACUTE ONLY): 15 min  Charges:  $Therapeutic Exercise: 8-22 mins                     Odell Fasching B. Migdalia Dk PT, DPT Acute Rehabilitation Services Pager [phone] Office [phone]    [place] 01/30/2020, 1:43 PM

## 2020-01-30 NOTE — Progress Notes (Signed)
Progress Note  Patient Name: Charles Gray Date of Encounter: 01/30/2020  [facility] Cardiologist: Berniece Salines, DO   Subjective   "I feel bad"    Legs still hurt  No CP   Breathing fair   Inpatient Medications    Scheduled Meds:  apixaban  5 mg Oral BID   baclofen  5 mg Oral TID   buprenorphine-naloxone  1 tablet Sublingual BID   furosemide  80 mg Intravenous BID   gabapentin  800 mg Oral TID   metoprolol tartrate  50 mg Oral BID   pantoprazole  40 mg Oral BID   pravastatin  20 mg Oral q1800   sodium chloride flush  3 mL Intravenous Q12H   spironolactone  25 mg Oral Daily   Continuous Infusions:  sodium chloride     PRN Meds: sodium chloride, acetaminophen, ondansetron (ZOFRAN) IV, polyethylene glycol, sodium chloride flush, zolpidem   Vital Signs    Vitals:   01/29/20 1645 01/29/20 2019 01/29/20 2342 01/30/20 0620  BP: (!) 165/97 (!) 144/93 130/90 113/81  Pulse: 90 85 75 65  Resp: 19 17 17 16   Temp: 97.8 F (36.6 C) 99.1 F (37.3 C) 98.8 F (37.1 C) 98.4 F (36.9 C)  TempSrc: Oral Oral Oral Oral  SpO2: 94% 95% 98% 95%  Weight:   (!) 168 kg   Height:        Intake/Output Summary (Last 24 hours) at 01/30/2020 0714 Last data filed at 01/29/2020 1900 Gross per 24 hour  Intake 1080 ml  Output 2050 ml  Net -970 ml   Net neg 5.4 L   Last 3 Weights 01/29/2020 01/29/2020 01/28/2020  Weight (lbs) 370 lb 6 oz 368 lb 9.8 oz 373 lb 8 oz  Weight (kg) 168 kg 167.2 kg 169.418 kg      Telemetry    SR  - Personally Reviewed  ECG    No new EKG done  - Personally Reviewed  Physical Exam   GEN: Morbidly obese 64 yo in no acute distress.   Neck: Neck is full   Cardiac: RRR, no murmurs Respiratory: Clear to auscultation bilaterally. GI: Soft, nontender, non-distended  MS: 1+ edema  Chronic changes to skin in legs from swellling Blister leg  Neuro:  Nonfocal  Psych: Normal affect   Labs    High Sensitivity Troponin:  No results for input(s):  TROPONINIHS in the last 720 hours.    Chemistry Recent Labs  Lab 01/26/20 1612 01/26/20 1612 01/28/20 0104 01/29/20 0110 01/30/20 0048  NA 139   < > 135 135 134*  K 3.6   < > 3.0* 3.3* 3.3*  CL 97*   < > 92* 90* 89*  CO2 31   < > 32 31 33*  GLUCOSE 112*   < > 118* 113* 135*  BUN 12   < > 17 18 20   CREATININE 1.16   < > 1.08 1.02 1.19  CALCIUM 9.2   < > 9.4 9.7 9.7  PROT 7.2  --   --   --   --   ALBUMIN 3.7   < > 3.7 3.9 3.9  AST 42*  --   --   --   --   ALT 28  --   --   --   --   ALKPHOS 70  --   --   --   --   BILITOT 0.8  --   --   --   --   GFRNONAA >  60   < > >60 >60 >60  ANIONGAP 11   < > 11 14 12    < > = values in this interval not displayed.     Hematology Recent Labs  Lab 01/26/20 1612 01/28/20 0104 01/30/20 0048  WBC 7.6 6.7 9.6  RBC 4.78 4.75 5.29  HGB 11.7* 11.8* 13.1  HCT 40.0 37.8* 42.4  MCV 83.7 79.6* 80.2  MCH 24.5* 24.8* 24.8*  MCHC 29.3* 31.2 30.9  RDW 16.0* 15.8* 15.8*  PLT 133* 146* 168    BNP Recent Labs  Lab 01/27/20 1215  BNP 24.1     DDimer No results for input(s): DDIMER in the last 168 hours.   Radiology    No results found.  Cardiac Studies   R heart cath 07/2019 RHC Procedural Findings: Hemodynamics (mmHg) RA mean 8 RV 21/8 PA 20/8, mean 14 PCWP mean 11  Oxygen saturations: PA 71% AO 100%  Cardiac Output (Fick) 8.5  Cardiac Index (Fick) 2.8  Echo 1. Suboptimal image quality for diagnostic purposes. 2. Left ventricular ejection fraction, by estimation, is 55 to 60%. The left ventricle has normal function. Left ventricular endocardial border not optimally defined to evaluate regional wall motion. Left ventricular diastolic parameters are consistent with Grade I diastolic dysfunction (impaired relaxation). 3. Right ventricular systolic function was not well visualized. The right ventricular size is not well visualized. 4. The mitral valve is grossly normal. No evidence of mitral valve regurgitation. No evidence  of mitral stenosis. 5. The aortic valve was not well visualized. Aortic valve regurgitation is not visualized. No aortic stenosis is present.    Patient Profile     64 y.o. male with h of chronic R sided CHF, CAD (mild on CT), HTN, pulmoonary emboil and atrial fbrillation admitted for edema  Assessment & Plan    1  Acute on chronic diastolic CHF    Pt continues to diurese  Still with signif LE edema  Has had DVT in past  Last echo was extremely difficult  But, RV function may be OK   LVEF is normal    Willl increase lasix to tid    Add Zaroxylyn to am dose and follow response    Needs to restrict Na and fluids   2  HTN  BP is better      3  Atrial fibrillation    Continue anticoagulation for PAF    Remains in SR   4  Hx PE   Last winter had PE  5  Hx UGI bleed  Fall 2020  Required 3 U PRBC   Follow Hgb  6  Hx [name]    For questions or updates, please contact [facility] Please consult [email or web address] for contact info under        Signed, Dorris Carnes, MD  01/30/2020, 7:14 AM

## 2020-01-30 NOTE — Progress Notes (Signed)
ABI's have been completed. Preliminary results can be found in CV Proc through chart review.   01/30/20 1:44 PM Charles Gray RVT

## 2020-01-30 NOTE — Plan of Care (Signed)
Nutrition Education Note  RD consulted for nutrition education regarding CHF: "low NA diet 2 gram; 1500 cc fluid." RD working remotely.  Spoke with pt via phone call to room. Pt reports that he attempts to follow a low sodium diet at home. He states that with his current health conditions, he feels like he would benefit from some in-home help. He reports that he is not able to stand and cook at the stove and has trouble reading Nutrition Facts Labels to find out the sodium content of foods.  Pt reports that he typically eats 3 meals daily. Pt states that breakfast is usually an avocado with pepper. Lunch is typically instant grits. Dinner is usually a freezer meal with microwave frozen vegetables. Pt limits himself to 3 bottles of water daily but reports he does drink other fluids like milk and buttermilk. Pt states that he weighs himself almost every day.  RD will provide "Low Sodium Nutrition Therapy" handout from the Academy of [employer] tomorrow.  Reviewed patient's dietary recall as listed above. Provided examples on ways to decrease sodium intake in diet. Discouraged intake of processed foods and use of salt shaker. Encouraged fresh fruits and vegetables as well as whole grain sources of carbohydrates to maximize fiber intake.   RD discussed why it is important for patient to adhere to diet recommendations, and emphasized the role of fluids, foods to avoid, and importance of weighing self daily. Teach back method used.  Expect good compliance.  Body mass index is 38.73 kg/m. Pt meets criteria for obesity class II based on current BMI.  Current diet order is Heart Healthy with 1500 ml fluid restriction, patient is consuming approximately 100% of meals at this time. Labs and medications reviewed.  Pt requesting RD follow up with pt in person tomorrow. Will bring diet education handout and recipe ideas.   Gaynell Face, MS, RD, LDN Inpatient Clinical  Dietitian Please see AMiON for contact information.

## 2020-01-31 DIAGNOSIS — I50813 Acute on chronic right heart failure: Secondary | ICD-10-CM | POA: Diagnosis not present

## 2020-01-31 LAB — RENAL FUNCTION PANEL
Albumin: 3.9 g/dL (ref 3.5–5.0)
Anion gap: 15 (ref 5–15)
BUN: 25 mg/dL — ABNORMAL HIGH (ref 8–23)
CO2: 32 mmol/L (ref 22–32)
Calcium: 9.7 mg/dL (ref 8.9–10.3)
Chloride: 89 mmol/L — ABNORMAL LOW (ref 98–111)
Creatinine, Ser: 1.16 mg/dL (ref 0.61–1.24)
GFR, Estimated: >60 mL/min (ref >60)
Glucose, Bld: 126 mg/dL — ABNORMAL HIGH (ref 70–99)
Phosphorus: 4 mg/dL (ref 2.5–4.6)
Potassium: 2.7 mmol/L — CL (ref 3.5–5.1)
Sodium: 136 mmol/L (ref 135–145)

## 2020-01-31 LAB — BASIC METABOLIC PANEL
Anion gap: 14 (ref 5–15)
BUN: 28 mg/dL — ABNORMAL HIGH (ref 8–23)
CO2: 33 mmol/L — ABNORMAL HIGH (ref 22–32)
Calcium: 9.7 mg/dL (ref 8.9–10.3)
Chloride: 88 mmol/L — ABNORMAL LOW (ref 98–111)
Creatinine, Ser: 1.22 mg/dL (ref 0.61–1.24)
GFR, Estimated: >60 mL/min (ref >60)
Glucose, Bld: 121 mg/dL — ABNORMAL HIGH (ref 70–99)
Potassium: 3.4 mmol/L — ABNORMAL LOW (ref 3.5–5.1)
Sodium: 135 mmol/L (ref 135–145)

## 2020-01-31 LAB — URINALYSIS, ROUTINE W REFLEX MICROSCOPIC
Bilirubin Urine: NEGATIVE
Glucose, UA: NEGATIVE mg/dL
Hgb urine dipstick: NEGATIVE
Ketones, ur: NEGATIVE mg/dL
Leukocytes,Ua: NEGATIVE
Nitrite: NEGATIVE
Protein, ur: NEGATIVE mg/dL
Specific Gravity, Urine: 1.009 (ref 1.005–1.030)
pH: 7 (ref 5.0–8.0)

## 2020-01-31 LAB — MAGNESIUM: Magnesium: 2.2 mg/dL (ref 1.7–2.4)

## 2020-01-31 MED ORDER — SPIRONOLACTONE 25 MG PO TABS
50.0000 mg | ORAL_TABLET | Freq: Every day | ORAL | Status: DC
  Administered 2020-01-31 – 2020-02-05 (×6): 50 mg via ORAL
  Filled 2020-01-31 (×6): qty 2

## 2020-01-31 MED ORDER — POTASSIUM CHLORIDE 10 MEQ/100ML IV SOLN
10.0000 meq | INTRAVENOUS | Status: AC
  Administered 2020-01-31 (×2): 10 meq via INTRAVENOUS
  Filled 2020-01-31 (×2): qty 100

## 2020-01-31 MED ORDER — POTASSIUM CHLORIDE CRYS ER 20 MEQ PO TBCR
40.0000 meq | EXTENDED_RELEASE_TABLET | ORAL | Status: AC
  Administered 2020-01-31 (×3): 40 meq via ORAL
  Filled 2020-01-31 (×3): qty 2

## 2020-01-31 NOTE — Progress Notes (Signed)
Received call from lab K+2.7. I paged Cardiology and waiting for return call to follow up.

## 2020-01-31 NOTE — Progress Notes (Signed)
Brief Nutrition Note  Followed up with pt in person after education provided yesterday via phone call.  RD provided "Low Sodium Nutrition Therapy" handout from the [employer]. Also provided pt with some recipes for low sodium slow-cooker meals. Discussed ways for pt to identify low sodium food options. Also discussed ways to limit sodium when eating out. All questions answered.   Gaynell Face, MS, RD, LDN Inpatient Clinical Dietitian Please see AMiON for contact information.

## 2020-01-31 NOTE — Plan of Care (Signed)
  Problem: Education: Goal: Ability to demonstrate management of disease process will improve Outcome: Progressing Goal: Ability to verbalize understanding of medication therapies will improve Outcome: Progressing   Problem: Activity: Goal: Capacity to carry out activities will improve Outcome: Progressing   Problem: Cardiac: Goal: Ability to achieve and maintain adequate cardiopulmonary perfusion will improve Outcome: Progressing   Problem: Clinical Measurements: Goal: Ability to maintain clinical measurements within normal limits will improve Outcome: Progressing Goal: Will remain free from infection Outcome: Progressing Goal: Diagnostic test results will improve Outcome: Progressing

## 2020-01-31 NOTE — Progress Notes (Signed)
Progress Note    Charles Gray  WFU:[id number] DOB: 10/25/1955  DOA: 01/26/2020 PCP: Enid Skeens., MD    Brief Narrative:     Medical records reviewed and are as summarized below:  Charles Gray is an 64 y.o. male with history of paroxysmal A. fib on Eliquis, probable chronic right heart failure, hypertension, possible cirrhosis, OSA and PE/DVT in 05/2019 direct up to ED by his cardiologist for refractory bilateral lower extremity edema and weight gain.  Started on IV Lasix and admitted for fluid overload/bilateral lower extremity edema and concern for acute on chronic right heart failure.  cardiology consulted, and recommended IV Lasix with metolazone.   Assessment/Plan:   Principal Problem:   Acute on chronic right-sided heart failure (HCC) Active Problems:   Benign essential HTN   OSA (obstructive sleep apnea)   Atrial fibrillation (HCC)   Acute on chronic right heart failure (HCC)   Acute on chronic diastolic CHF  -diuretics per cardiology -ABIs ok-- UNNA boot placed -Leg elevation, fluid and sodium restriction -daily weights/strict I/Os  Paroxysmal atrial fibrillation:  -Continue Eliquis for anticoagulation. -Started on metoprolol  OSA on CPAP -Refusing CPAP. -not compliant at home either  History of DVT/PE -Continue Eliquis  Essential hypertension: Normotensive -Now on metoprolol and diuretics as above  Chronic pain syndrome: Stable -Continue home Suboxone and gabapentin  Thrombocytopenia:  -Resolved. -Continue monitoring  Hypokalemia:  -replete aggressively with recheck this PM  Morbid obesity Body mass index is 38.52 kg/m.   Family Communication/Anticipated D/C date and plan/Code Status   DVT prophylaxis: eliquis Code Status: Full Code.  Disposition Plan: Status is: Inpatient  Remains inpatient appropriate because:IV treatments appropriate due to intensity of illness or inability to take PO   Dispo: The patient is  from: Home              Anticipated d/c is to: Home              Anticipated d/c date is: 2 days              Patient currently is not medically stable to d/c.         Medical Consultants:    cards  Subjective:   C/o headache  Objective:    Vitals:   01/31/20 0348 01/31/20 0832 01/31/20 0946 01/31/20 1049  BP: 108/78 132/78 129/81 124/73  Pulse: 83  64   Resp: 11 10  11   Temp: 98.5 F (36.9 C) 98.3 F (36.8 C)  98.2 F (36.8 C)  TempSrc: Oral Oral  Oral  SpO2: 96%     Weight: (!) 167.1 kg     Height:        Intake/Output Summary (Last 24 hours) at 01/31/2020 1137 Last data filed at 01/31/2020 0813 Gross per 24 hour  Intake 480 ml  Output 2700 ml  Net -2220 ml   Filed Weights   01/29/20 2342 01/30/20 0620 01/31/20 0348  Weight: (!) 168 kg (!) 168 kg (!) 167.1 kg    Exam:  General: Appearance:    obese male in no acute distress, sitting on side of bed     Lungs:     Clear to auscultation bilaterally, respirations unlabored  Heart:    Normal heart rate. No murmurs, rubs, or gallops. B/l legs wrapped to knees  MS:   All extremities are intact.   Neurologic:   Awake, alert, oriented x 3. No apparent focal neurological  defect.     Data Reviewed:   I have personally reviewed following labs and imaging studies:  Labs: Labs show the following:   Basic Metabolic Panel: Recent Labs  Lab 01/26/20 1612 01/26/20 1612 01/28/20 0104 01/28/20 0104 01/29/20 0110 01/29/20 0110 01/30/20 0048 01/31/20 0056  NA 139  --  135  --  135  --  134* 136  K 3.6   < > 3.0*   < > 3.3*   < > 3.3* 2.7*  CL 97*  --  92*  --  90*  --  89* 89*  CO2 31  --  32  --  31  --  33* 32  GLUCOSE 112*  --  118*  --  113*  --  135* 126*  BUN 12  --  17  --  18  --  20 25*  CREATININE 1.16  --  1.08  --  1.02  --  1.19 1.16  CALCIUM 9.2  --  9.4  --  9.7  --  9.7 9.7  MG  --   --  2.2  --  2.3  --  2.2 2.2  PHOS  --   --  4.4  --  3.6  --  3.7 4.0   < > = values in  this interval not displayed.   GFR Estimated Creatinine Clearance: 115.7 mL/min (by C-G formula based on SCr of 1.16 mg/dL). Liver Function Tests: Recent Labs  Lab 01/26/20 1612 01/28/20 0104 01/29/20 0110 01/30/20 0048 01/31/20 0056  AST 42*  --   --   --   --   ALT 28  --   --   --   --   ALKPHOS 70  --   --   --   --   BILITOT 0.8  --   --   --   --   PROT 7.2  --   --   --   --   ALBUMIN 3.7 3.7 3.9 3.9 3.9   No results for input(s): LIPASE, AMYLASE in the last 168 hours. No results for input(s): AMMONIA in the last 168 hours. Coagulation profile No results for input(s): INR, PROTIME in the last 168 hours.  CBC: Recent Labs  Lab 01/26/20 1612 01/28/20 0104 01/30/20 0048  WBC 7.6 6.7 9.6  NEUTROABS 6.1  --   --   HGB 11.7* 11.8* 13.1  HCT 40.0 37.8* 42.4  MCV 83.7 79.6* 80.2  PLT 133* 146* 168   Cardiac Enzymes: No results for input(s): CKTOTAL, CKMB, CKMBINDEX, TROPONINI in the last 168 hours. BNP (last 3 results) Recent Labs    02/27/19 1106  PROBNP 37   CBG: No results for input(s): GLUCAP in the last 168 hours. D-Dimer: No results for input(s): DDIMER in the last 72 hours. Hgb A1c: No results for input(s): HGBA1C in the last 72 hours. Lipid Profile: No results for input(s): CHOL, HDL, LDLCALC, TRIG, CHOLHDL, LDLDIRECT in the last 72 hours. Thyroid function studies: No results for input(s): TSH, T4TOTAL, T3FREE, THYROIDAB in the last 72 hours.  Invalid input(s): FREET3 Anemia work up: No results for input(s): VITAMINB12, FOLATE, FERRITIN, TIBC, IRON, RETICCTPCT in the last 72 hours. Sepsis Labs: Recent Labs  Lab 01/26/20 1612 01/28/20 0104 01/30/20 0048  WBC 7.6 6.7 9.6    Microbiology Recent Results (from the past 240 hour(s))  Respiratory Panel by RT PCR (Flu A&B, Covid) - Nasopharyngeal Swab     Status: None   Collection Time: 01/27/20 12:16  AM   Specimen: Nasopharyngeal Swab  Result Value Ref Range Status   SARS Coronavirus 2 by RT  PCR NEGATIVE NEGATIVE Final    Comment: (NOTE) SARS-CoV-2 target nucleic acids are NOT DETECTED.  The SARS-CoV-2 RNA is generally detectable in upper respiratoy specimens during the acute phase of infection. The lowest concentration of SARS-CoV-2 viral copies this assay can detect is 131 copies/mL. A negative result does not preclude SARS-Cov-2 infection and should not be used as the sole basis for treatment or other patient management decisions. A negative result may occur with  improper specimen collection/handling, submission of specimen other than nasopharyngeal swab, presence of viral mutation(s) within the areas targeted by this assay, and inadequate number of viral copies (<131 copies/mL). A negative result must be combined with clinical observations, patient history, and epidemiological information. The expected result is Negative.  Fact Sheet for Patients:  PinkCheek.be  Fact Sheet for Healthcare Providers:  GravelBags.it  This test is no t yet approved or cleared by the [place] FDA and  has been authorized for detection and/or diagnosis of SARS-CoV-2 by FDA under an Emergency Use Authorization (EUA). This EUA will remain  in effect (meaning this test can be used) for the duration of the COVID-19 declaration under Section 564(b)(1) of the Act, 21 U.S.C. section 360bbb-3(b)(1), unless the authorization is terminated or revoked sooner.     Influenza A by PCR NEGATIVE NEGATIVE Final   Influenza B by PCR NEGATIVE NEGATIVE Final    Comment: (NOTE) The Xpert Xpress SARS-CoV-2/FLU/RSV assay is intended as an aid in  the diagnosis of influenza from Nasopharyngeal swab specimens and  should not be used as a sole basis for treatment. Nasal washings and  aspirates are unacceptable for Xpert Xpress SARS-CoV-2/FLU/RSV  testing.  Fact Sheet for Patients: PinkCheek.be  Fact Sheet for  Healthcare Providers: GravelBags.it  This test is not yet approved or cleared by the [place] FDA and  has been authorized for detection and/or diagnosis of SARS-CoV-2 by  FDA under an Emergency Use Authorization (EUA). This EUA will remain  in effect (meaning this test can be used) for the duration of the  Covid-19 declaration under Section 564(b)(1) of the Act, 21  U.S.C. section 360bbb-3(b)(1), unless the authorization is  terminated or revoked. Performed at [facility], [place] [address] [place], [place] [address]   MRSA PCR Screening     Status: None   Collection Time: 01/27/20  6:46 PM   Specimen: Nasal Mucosa; Nasopharyngeal  Result Value Ref Range Status   MRSA by PCR NEGATIVE NEGATIVE Final    Comment:        The GeneXpert MRSA Assay (FDA approved for NASAL specimens only), is one component of a comprehensive MRSA colonization surveillance program. It is not intended to diagnose MRSA infection nor to guide or monitor treatment for MRSA infections. Performed at [facility], [place] [address] [place], [place] [address]     Procedures and diagnostic studies:  VAS [place] ABI WITH/WO TBI  Result Date: 01/30/2020 LOWER EXTREMITY DOPPLER STUDY Indications: Diminished pulses. High Risk Factors: Hypertension.  Comparison Study: No prior studies. Performing Technologist: Carlos Levering Rvt  Examination Guidelines: A complete evaluation includes at minimum, Doppler waveform signals and systolic blood pressure reading at the level of bilateral brachial, anterior tibial, and posterior tibial arteries, when vessel segments are accessible. Bilateral testing is considered an integral part of a complete examination. Photoelectric Plethysmograph (PPG) waveforms and toe systolic pressure readings are included  as required and additional duplex testing as needed. Limited examinations for reoccurring indications may be performed as noted.  ABI Findings:  +--------+------------------+-----+---------+--------+ Right   Rt Pressure (mmHg)IndexWaveform Comment  +--------+------------------+-----+---------+--------+ [id number]                    triphasic         +--------+------------------+-----+---------+--------+ PTA     161               1.10 triphasic         +--------+------------------+-----+---------+--------+ DP      149               1.02 biphasic          +--------+------------------+-----+---------+--------+ +--------+------------------+-----+---------+-------+ Left    Lt Pressure (mmHg)IndexWaveform Comment +--------+------------------+-----+---------+-------+ [id number]                    triphasic        +--------+------------------+-----+---------+-------+ PTA     188               1.29 triphasic        +--------+------------------+-----+---------+-------+ DP      149               1.02 biphasic         +--------+------------------+-----+---------+-------+  Summary: Right: Resting right ankle-brachial index is within normal range. No evidence of significant right lower extremity arterial disease. Left: Resting left ankle-brachial index is within normal range. No evidence of significant left lower extremity arterial disease.  *See table(s) above for measurements and observations.  Electronically signed by Curt Jews MD on 01/30/2020 at 4:23:44 PM.   Final     Medications:   . apixaban  5 mg Oral BID  . buprenorphine-naloxone  1 tablet Sublingual BID  . furosemide  80 mg Intravenous TID  . gabapentin  800 mg Oral TID  . metoprolol tartrate  50 mg Oral BID  . pantoprazole  40 mg Oral BID  . potassium chloride  40 mEq Oral Q4H  . pravastatin  20 mg Oral TID  . sodium chloride flush  3 mL Intravenous Q12H  . spironolactone  50 mg Oral Daily   Continuous Infusions: . sodium chloride       LOS: 4 days   Geradine Girt  [facility]   How to contact the [facility] Attending or Consulting  provider [place] or covering provider during after hours [place], for this patient?  1. Check the care team in [facility] and look for a) attending/consulting [facility] provider listed and b) the [facility] team listed 2. Log into [email or web address] and use 's universal password to access. If you do not have the password, please contact the hospital operator. 3. Locate the [facility] provider you are looking for under [facility] and page to a number that you can be directly reached. 4. If you still have difficulty reaching the provider, please page the [facility] (Director on Call) for the Hospitalists listed on amion for assistance.  01/31/2020, 11:37 AM

## 2020-01-31 NOTE — Progress Notes (Signed)
Progress Note  Patient Name: Charles Gray Date of Encounter: 01/31/2020  [facility] Cardiologist: Berniece Salines, DO   Subjective   Pt still with leg discomfort  No CP  Breathing fair   Inpatient Medications    Scheduled Meds: . apixaban  5 mg Oral BID  . buprenorphine-naloxone  1 tablet Sublingual BID  . furosemide  80 mg Intravenous TID  . gabapentin  800 mg Oral TID  . metoprolol tartrate  50 mg Oral BID  . pantoprazole  40 mg Oral BID  . potassium chloride  40 mEq Oral Q4H  . pravastatin  20 mg Oral TID  . sodium chloride flush  3 mL Intravenous Q12H  . spironolactone  50 mg Oral Daily   Continuous Infusions: . sodium chloride     PRN Meds: sodium chloride, acetaminophen, ondansetron (ZOFRAN) IV, polyethylene glycol, senna-docusate, sodium chloride flush, zolpidem   Vital Signs    Vitals:   01/30/20 1538 01/30/20 2007 01/30/20 2341 01/31/20 0348  BP: 125/81  115/77 108/78  Pulse: 65  68 83  Resp: 14   11  Temp: 98.5 F (36.9 C) 98.7 F (37.1 C) 97.7 F (36.5 C) 98.5 F (36.9 C)  TempSrc: Oral Oral Oral Oral  SpO2: 95%  99% 96%  Weight:    (!) 167.1 kg  Height:        Intake/Output Summary (Last 24 hours) at 01/31/2020 0727 Last data filed at 01/30/2020 2300 Gross per 24 hour  Intake 240 ml  Output 4375 ml  Net -4135 ml   Net neg 9.6 L   Last 3 Weights 01/31/2020 01/30/2020 01/29/2020  Weight (lbs) 368 lb 6.2 oz 370 lb 6 oz 370 lb 6 oz  Weight (kg) 167.1 kg 168 kg 168 kg      Telemetry     SR  - Personally Reviewed  ECG    No new EKG done  - Personally Reviewed  Physical Exam   GEN: Morbidly obese 64 yo in no acute distress.   Neck: Neck is full   Cardiac: RRR,  Respiratory: Clear to auscultation bilaterally. GI: Soft, nontender, non-distended  MS: 1+ edema  \Legs wrapped  Neuro:  Nonfocal  Psych: Normal affect   Labs    High Sensitivity Troponin:  No results for input(s): TROPONINIHS in the last 720 hours.     Chemistry Recent Labs  Lab 01/26/20 1612 01/28/20 0104 01/29/20 0110 01/30/20 0048 01/31/20 0056  NA 139   < > 135 134* 136  K 3.6   < > 3.3* 3.3* 2.7*  CL 97*   < > 90* 89* 89*  CO2 31   < > 31 33* 32  GLUCOSE 112*   < > 113* 135* 126*  BUN 12   < > 18 20 25*  CREATININE 1.16   < > 1.02 1.19 1.16  CALCIUM 9.2   < > 9.7 9.7 9.7  PROT 7.2  --   --   --   --   ALBUMIN 3.7   < > 3.9 3.9 3.9  AST 42*  --   --   --   --   ALT 28  --   --   --   --   ALKPHOS 70  --   --   --   --   BILITOT 0.8  --   --   --   --   GFRNONAA >60   < > >60 >60 >60  ANIONGAP 11   < >  14 12 15    < > = values in this interval not displayed.     Hematology Recent Labs  Lab 01/26/20 1612 01/28/20 0104 01/30/20 0048  WBC 7.6 6.7 9.6  RBC 4.78 4.75 5.29  HGB 11.7* 11.8* 13.1  HCT 40.0 37.8* 42.4  MCV 83.7 79.6* 80.2  MCH 24.5* 24.8* 24.8*  MCHC 29.3* 31.2 30.9  RDW 16.0* 15.8* 15.8*  PLT 133* 146* 168    BNP Recent Labs  Lab 01/27/20 1215  BNP 24.1     DDimer No results for input(s): DDIMER in the last 168 hours.   Radiology    VAS [place] ABI WITH/WO TBI  Result Date: 01/30/2020 LOWER EXTREMITY DOPPLER STUDY Indications: Diminished pulses. High Risk Factors: Hypertension.  Comparison Study: No prior studies. Performing Technologist: Carlos Levering Rvt  Examination Guidelines: A complete evaluation includes at minimum, Doppler waveform signals and systolic blood pressure reading at the level of bilateral brachial, anterior tibial, and posterior tibial arteries, when vessel segments are accessible. Bilateral testing is considered an integral part of a complete examination. Photoelectric Plethysmograph (PPG) waveforms and toe systolic pressure readings are included as required and additional duplex testing as needed. Limited examinations for reoccurring indications may be performed as noted.  ABI Findings: +--------+------------------+-----+---------+--------+ Right   Rt Pressure  (mmHg)IndexWaveform Comment  +--------+------------------+-----+---------+--------+ [id number]                    triphasic         +--------+------------------+-----+---------+--------+ PTA     161               1.10 triphasic         +--------+------------------+-----+---------+--------+ DP      149               1.02 biphasic          +--------+------------------+-----+---------+--------+ +--------+------------------+-----+---------+-------+ Left    Lt Pressure (mmHg)IndexWaveform Comment +--------+------------------+-----+---------+-------+ [id number]                    triphasic        +--------+------------------+-----+---------+-------+ PTA     188               1.29 triphasic        +--------+------------------+-----+---------+-------+ DP      149               1.02 biphasic         +--------+------------------+-----+---------+-------+  Summary: Right: Resting right ankle-brachial index is within normal range. No evidence of significant right lower extremity arterial disease. Left: Resting left ankle-brachial index is within normal range. No evidence of significant left lower extremity arterial disease.  *See table(s) above for measurements and observations.  Electronically signed by Curt Jews MD on 01/30/2020 at 4:23:44 PM.   Final     Cardiac Studies   R heart cath 07/2019 [facility] Procedural Findings: Hemodynamics (mmHg) RA mean 8 RV 21/8 PA 20/8, mean 14 PCWP mean 11  Oxygen saturations: PA 71% AO 100%  Cardiac Output (Fick) 8.5  Cardiac Index (Fick) 2.8  Echo 1. Suboptimal image quality for diagnostic purposes. 2. Left ventricular ejection fraction, by estimation, is 55 to 60%. The left ventricle has normal function. Left ventricular endocardial border not optimally defined to evaluate regional wall motion. Left ventricular diastolic parameters are consistent with Grade I diastolic dysfunction (impaired relaxation). 3. Right ventricular  systolic function was not well visualized. The right ventricular size is not well visualized. 4. The  mitral valve is grossly normal. No evidence of mitral valve regurgitation. No evidence of mitral stenosis. 5. The aortic valve was not well visualized. Aortic valve regurgitation is not visualized. No aortic stenosis is present.    Patient Profile     64 y.o. male with h of chronic R sided CHF, CAD (mild on CT), HTN, pulmoonary emboil and atrial fbrillation admitted for edema  Assessment & Plan    1  Acute on chronic diastolic CHF    Pt continues to diurese  Still with signif LE edema  Has had DVT in past  Last echo was extremely difficult  But, RV function may be OK   LVEF is normal     Pt has put out signif urine after increase in lasix and addition of zaroxylyn    WIll continue   Urina a little darker   Will send for UA (for SG)    Cr has remained stable    2  HTN  BP is good    3  Atrial fibrillation    Continue anticoagulation for PAF    Remains in SR   4  Hx PE   Last winter had PE  5  Hx UGI bleed  Fall 2020  Required 3 U PRBC   Follow Hgb  Was 13/1 yesterday   6  Hx [name]    For questions or updates, please contact [facility] Please consult [email or web address] for contact info under        Signed, Dorris Carnes, MD  01/31/2020, 7:27 AM

## 2020-02-01 DIAGNOSIS — I50813 Acute on chronic right heart failure: Secondary | ICD-10-CM | POA: Diagnosis not present

## 2020-02-01 LAB — BASIC METABOLIC PANEL
Anion gap: 15 (ref 5–15)
Anion gap: 14 (ref 5–15)
BUN: 27 mg/dL — ABNORMAL HIGH (ref 8–23)
BUN: 26 mg/dL — ABNORMAL HIGH (ref 8–23)
CO2: 32 mmol/L (ref 22–32)
CO2: 32 mmol/L (ref 22–32)
Calcium: 9.4 mg/dL (ref 8.9–10.3)
Calcium: 9.4 mg/dL (ref 8.9–10.3)
Chloride: 87 mmol/L — ABNORMAL LOW (ref 98–111)
Chloride: 87 mmol/L — ABNORMAL LOW (ref 98–111)
Creatinine, Ser: 1.24 mg/dL (ref 0.61–1.24)
Creatinine, Ser: 1.17 mg/dL (ref 0.61–1.24)
GFR, Estimated: >60 mL/min (ref >60)
GFR, Estimated: >60 mL/min (ref >60)
Glucose, Bld: 145 mg/dL — ABNORMAL HIGH (ref 70–99)
Glucose, Bld: 175 mg/dL — ABNORMAL HIGH (ref 70–99)
Potassium: 2.7 mmol/L — CL (ref 3.5–5.1)
Potassium: 3.4 mmol/L — ABNORMAL LOW (ref 3.5–5.1)
Sodium: 134 mmol/L — ABNORMAL LOW (ref 135–145)
Sodium: 133 mmol/L — ABNORMAL LOW (ref 135–145)

## 2020-02-01 LAB — CBC
HCT: 42.7 % (ref 39.0–52.0)
Hemoglobin: 13.2 g/dL (ref 13.0–17.0)
MCH: 24.6 pg — ABNORMAL LOW (ref 26.0–34.0)
MCHC: 30.9 g/dL (ref 30.0–36.0)
MCV: 79.7 fL — ABNORMAL LOW (ref 80.0–100.0)
Platelets: 210 10*3/uL (ref 150–400)
RBC: 5.36 MIL/uL (ref 4.22–5.81)
RDW: 15.9 % — ABNORMAL HIGH (ref 11.5–15.5)
WBC: 11.5 10*3/uL — ABNORMAL HIGH (ref 4.0–10.5)
nRBC: 0 % (ref 0.0–0.2)

## 2020-02-01 MED ORDER — POTASSIUM CHLORIDE CRYS ER 20 MEQ PO TBCR
40.0000 meq | EXTENDED_RELEASE_TABLET | Freq: Once | ORAL | Status: AC
  Administered 2020-02-01: 40 meq via ORAL
  Filled 2020-02-01: qty 2

## 2020-02-01 MED ORDER — POTASSIUM CHLORIDE 10 MEQ/100ML IV SOLN
10.0000 meq | Freq: Once | INTRAVENOUS | Status: AC
  Administered 2020-02-01: 10 meq via INTRAVENOUS
  Filled 2020-02-01: qty 100

## 2020-02-01 MED ORDER — POTASSIUM CHLORIDE CRYS ER 20 MEQ PO TBCR
40.0000 meq | EXTENDED_RELEASE_TABLET | Freq: Three times a day (TID) | ORAL | Status: DC
  Administered 2020-02-01 – 2020-02-04 (×11): 40 meq via ORAL
  Filled 2020-02-01 (×12): qty 2

## 2020-02-01 MED ORDER — POTASSIUM CHLORIDE CRYS ER 20 MEQ PO TBCR
40.0000 meq | EXTENDED_RELEASE_TABLET | ORAL | Status: AC
  Administered 2020-02-01 (×2): 40 meq via ORAL
  Filled 2020-02-01 (×2): qty 2

## 2020-02-01 NOTE — Plan of Care (Signed)
  Problem: Education: Goal: Ability to demonstrate management of disease process will improve Outcome: Progressing Goal: Ability to verbalize understanding of medication therapies will improve Outcome: Progressing   Problem: Activity: Goal: Capacity to carry out activities will improve Outcome: Progressing   Problem: Cardiac: Goal: Ability to achieve and maintain adequate cardiopulmonary perfusion will improve Outcome: Progressing   Problem: Clinical Measurements: Goal: Will remain free from infection Outcome: Progressing Goal: Respiratory complications will improve Outcome: Progressing Goal: Cardiovascular complication will be avoided Outcome: Progressing

## 2020-02-01 NOTE — Progress Notes (Signed)
Progress Note    Charles Gray  RDE:[id number] DOB: [birth year]  DOA: 01/26/2020 PCP: Enid Skeens., MD    Brief Narrative:     Medical records reviewed and are as summarized below:  Charles Gray is an 64 y.o. male with history of paroxysmal A. fib on Eliquis, probable chronic right heart failure, hypertension, possible cirrhosis, OSA and PE/DVT in 05/2019 direct up to ED by his cardiologist for refractory bilateral lower extremity edema and weight gain.  Started on IV Lasix and admitted for fluid overload/bilateral lower extremity edema and concern for acute on chronic right heart failure.  cardiology consulted, and recommended IV diuresis.  Assessment/Plan:   Principal Problem:   Acute on chronic right-sided heart failure (HCC) Active Problems:   Benign essential HTN   OSA (obstructive sleep apnea)   Atrial fibrillation (HCC)   Acute on chronic right heart failure (HCC)   Acute on chronic diastolic CHF  -diuretics per cardiology -ABIs ok-- UNNA boot placed -Leg elevation, fluid and sodium restriction -daily weights/strict I/Os -down 12.2L thus far  Paroxysmal atrial fibrillation:  -Continue Eliquis for anticoagulation. -Started on metoprolol  OSA on CPAP -Refusing CPAP. -not compliant at home either  History of DVT/PE -Continue Eliquis  Essential hypertension: Normotensive -Now on metoprolol and diuretics as above  Chronic pain syndrome: Stable -Continue home Suboxone and gabapentin  Thrombocytopenia:  -Resolved. -Continue monitoring  Hypokalemia:  -replete aggressively   Morbid obesity Body mass index is 38.26 kg/m.   Family Communication/Anticipated D/C date and plan/Code Status   DVT prophylaxis: eliquis Code Status: Full Code.  Disposition Plan: Status is: Inpatient  Remains inpatient appropriate because:IV treatments appropriate due to intensity of illness or inability to take PO   Dispo: The patient is from: Home               Anticipated d/c is to: Home              Anticipated d/c date is: 2 days              Patient currently is not medically stable to d/c.         Medical Consultants:    cards  Subjective:   Says his apartment is upstairs and has 20+ steps to get there Asking for an aide  Objective:    Vitals:   01/31/20 2328 02/01/20 0352 02/01/20 0722 02/01/20 1106  BP: (!) 122/104 118/68 103/69 137/84  Pulse: 71 70 63 66  Resp: 18 15 14 12   Temp: 98.6 F (37 C) 97.7 F (36.5 C) 97.6 F (36.4 C) 98 F (36.7 C)  TempSrc: Oral Axillary Oral Oral  SpO2: 96% 98% 97% 95%  Weight:  (!) 166 kg    Height:        Intake/Output Summary (Last 24 hours) at 02/01/2020 1253 Last data filed at 02/01/2020 1103 Gross per 24 hour  Intake 520 ml  Output 3425 ml  Net -2905 ml   Filed Weights   01/30/20 0620 01/31/20 0348 02/01/20 0352  Weight: (!) 168 kg (!) 167.1 kg (!) 166 kg    Exam:   General: Appearance:    Obese male in no acute distress     Lungs:     respirations unlabored  Heart:    Normal heart rate.  No murmurs, rubs, or gallops.   MS:   All extremities are intact. UNNA boots placed  Neurologic:   Awake, alert, oriented x 3. No apparent focal neurological  defect.     Data Reviewed:   I have personally reviewed following labs and imaging studies:  Labs: Labs show the following:   Basic Metabolic Panel: Recent Labs  Lab 01/28/20 0104 01/28/20 0104 01/29/20 0110 01/29/20 0110 01/30/20 6761 01/30/20 0048 01/31/20 0056 01/31/20 0056 01/31/20 1623 02/01/20 0031  NA 135   < > 135  --  134*  --  136  --  135 134*  K 3.0*   < > 3.3*   < > 3.3*   < > 2.7*   < > 3.4* 2.7*  CL 92*   < > 90*  --  89*  --  89*  --  88* 87*  CO2 32   < > 31  --  33*  --  32  --  33* 32  GLUCOSE 118*   < > 113*  --  135*  --  126*  --  121* 145*  BUN 17   < > 18  --  20  --  25*  --  28* 27*  CREATININE 1.08   < > 1.02  --  1.19  --  1.16  --  1.22 1.24  CALCIUM 9.4    < > 9.7  --  9.7  --  9.7  --  9.7 9.4  MG 2.2  --  2.3  --  2.2  --  2.2  --   --   --   PHOS 4.4  --  3.6  --  3.7  --  4.0  --   --   --    < > = values in this interval not displayed.   GFR Estimated Creatinine Clearance: 107.9 mL/min (by C-G formula based on SCr of 1.24 mg/dL). Liver Function Tests: Recent Labs  Lab 01/26/20 1612 01/28/20 0104 01/29/20 0110 01/30/20 0048 01/31/20 0056  AST 42*  --   --   --   --   ALT 28  --   --   --   --   ALKPHOS 70  --   --   --   --   BILITOT 0.8  --   --   --   --   PROT 7.2  --   --   --   --   ALBUMIN 3.7 3.7 3.9 3.9 3.9   No results for input(s): LIPASE, AMYLASE in the last 168 hours. No results for input(s): AMMONIA in the last 168 hours. Coagulation profile No results for input(s): INR, PROTIME in the last 168 hours.  CBC: Recent Labs  Lab 01/26/20 1612 01/28/20 0104 01/30/20 0048 02/01/20 0031  WBC 7.6 6.7 9.6 11.5*  NEUTROABS 6.1  --   --   --   HGB 11.7* 11.8* 13.1 13.2  HCT 40.0 37.8* 42.4 42.7  MCV 83.7 79.6* 80.2 79.7*  PLT 133* 146* 168 210   Cardiac Enzymes: No results for input(s): CKTOTAL, CKMB, CKMBINDEX, TROPONINI in the last 168 hours. BNP (last 3 results) Recent Labs    02/27/19 1106  PROBNP 37   CBG: No results for input(s): GLUCAP in the last 168 hours. D-Dimer: No results for input(s): DDIMER in the last 72 hours. Hgb A1c: No results for input(s): HGBA1C in the last 72 hours. Lipid Profile: No results for input(s): CHOL, HDL, LDLCALC, TRIG, CHOLHDL, LDLDIRECT in the last 72 hours. Thyroid function studies: No results for input(s): TSH, T4TOTAL, T3FREE, THYROIDAB in the last 72 hours.  Invalid input(s): FREET3 Anemia work up: No  results for input(s): VITAMINB12, FOLATE, FERRITIN, TIBC, IRON, RETICCTPCT in the last 72 hours. Sepsis Labs: Recent Labs  Lab 01/26/20 1612 01/28/20 0104 01/30/20 0048 02/01/20 0031  WBC 7.6 6.7 9.6 11.5*    Microbiology Recent Results (from the past  240 hour(s))  Respiratory Panel by RT PCR (Flu A&B, Covid) - Nasopharyngeal Swab     Status: None   Collection Time: 01/27/20 12:16 AM   Specimen: Nasopharyngeal Swab  Result Value Ref Range Status   SARS Coronavirus 2 by RT PCR NEGATIVE NEGATIVE Final    Comment: (NOTE) SARS-CoV-2 target nucleic acids are NOT DETECTED.  The SARS-CoV-2 RNA is generally detectable in upper respiratoy specimens during the acute phase of infection. The lowest concentration of SARS-CoV-2 viral copies this assay can detect is 131 copies/mL. A negative result does not preclude SARS-Cov-2 infection and should not be used as the sole basis for treatment or other patient management decisions. A negative result may occur with  improper specimen collection/handling, submission of specimen other than nasopharyngeal swab, presence of viral mutation(s) within the areas targeted by this assay, and inadequate number of viral copies (<131 copies/mL). A negative result must be combined with clinical observations, patient history, and epidemiological information. The expected result is Negative.  Fact Sheet for Patients:  PinkCheek.be  Fact Sheet for Healthcare Providers:  GravelBags.it  This test is no t yet approved or cleared by the [place] FDA and  has been authorized for detection and/or diagnosis of SARS-CoV-2 by FDA under an Emergency Use Authorization (EUA). This EUA will remain  in effect (meaning this test can be used) for the duration of the COVID-19 declaration under Section 564(b)(1) of the Act, 21 U.S.C. section 360bbb-3(b)(1), unless the authorization is terminated or revoked sooner.     Influenza A by PCR NEGATIVE NEGATIVE Final   Influenza B by PCR NEGATIVE NEGATIVE Final    Comment: (NOTE) The Xpert Xpress SARS-CoV-2/FLU/RSV assay is intended as an aid in  the diagnosis of influenza from Nasopharyngeal swab specimens and  should  not be used as a sole basis for treatment. Nasal washings and  aspirates are unacceptable for Xpert Xpress SARS-CoV-2/FLU/RSV  testing.  Fact Sheet for Patients: PinkCheek.be  Fact Sheet for Healthcare Providers: GravelBags.it  This test is not yet approved or cleared by the [place] FDA and  has been authorized for detection and/or diagnosis of SARS-CoV-2 by  FDA under an Emergency Use Authorization (EUA). This EUA will remain  in effect (meaning this test can be used) for the duration of the  Covid-19 declaration under Section 564(b)(1) of the Act, 21  U.S.C. section 360bbb-3(b)(1), unless the authorization is  terminated or revoked. Performed at [facility], [place] [address] [place], [place] [address]   MRSA PCR Screening     Status: None   Collection Time: 01/27/20  6:46 PM   Specimen: Nasal Mucosa; Nasopharyngeal  Result Value Ref Range Status   MRSA by PCR NEGATIVE NEGATIVE Final    Comment:        The GeneXpert MRSA Assay (FDA approved for NASAL specimens only), is one component of a comprehensive MRSA colonization surveillance program. It is not intended to diagnose MRSA infection nor to guide or monitor treatment for MRSA infections. Performed at [facility], [place] [address], [place], [place] [address]     Procedures and diagnostic studies:  VAS [place] ABI WITH/WO TBI  Result Date: 01/30/2020 LOWER EXTREMITY DOPPLER STUDY Indications: Diminished pulses. High Risk  Factors: Hypertension.  Comparison Study: No prior studies. Performing Technologist: Carlos Levering Rvt  Examination Guidelines: A complete evaluation includes at minimum, Doppler waveform signals and systolic blood pressure reading at the level of bilateral brachial, anterior tibial, and posterior tibial arteries, when vessel segments are accessible. Bilateral testing is considered an integral part of a complete examination.  Photoelectric Plethysmograph (PPG) waveforms and toe systolic pressure readings are included as required and additional duplex testing as needed. Limited examinations for reoccurring indications may be performed as noted.  ABI Findings: +--------+------------------+-----+---------+--------+ Right   Rt Pressure (mmHg)IndexWaveform Comment  +--------+------------------+-----+---------+--------+ [id number]                    triphasic         +--------+------------------+-----+---------+--------+ PTA     161               1.10 triphasic         +--------+------------------+-----+---------+--------+ DP      149               1.02 biphasic          +--------+------------------+-----+---------+--------+ +--------+------------------+-----+---------+-------+ Left    Lt Pressure (mmHg)IndexWaveform Comment +--------+------------------+-----+---------+-------+ [id number]                    triphasic        +--------+------------------+-----+---------+-------+ PTA     188               1.29 triphasic        +--------+------------------+-----+---------+-------+ DP      149               1.02 biphasic         +--------+------------------+-----+---------+-------+  Summary: Right: Resting right ankle-brachial index is within normal range. No evidence of significant right lower extremity arterial disease. Left: Resting left ankle-brachial index is within normal range. No evidence of significant left lower extremity arterial disease.  *See table(s) above for measurements and observations.  Electronically signed by Curt Jews MD on 01/30/2020 at 4:23:44 PM.   Final     Medications:   . apixaban  5 mg Oral BID  . buprenorphine-naloxone  1 tablet Sublingual BID  . furosemide  80 mg Intravenous TID  . gabapentin  800 mg Oral TID  . metoprolol tartrate  50 mg Oral BID  . pantoprazole  40 mg Oral BID  . pravastatin  20 mg Oral TID  . sodium chloride flush  3 mL Intravenous Q12H   . spironolactone  50 mg Oral Daily   Continuous Infusions: . sodium chloride       LOS: 5 days   Geradine Girt  [facility]   How to contact the [facility] Attending or Consulting provider [place] or covering provider during after hours [place], for this patient?  1. Check the care team in [facility] and look for a) attending/consulting [facility] provider listed and b) the [facility] team listed 2. Log into [email or web address] and use [place]'s universal password to access. If you do not have the password, please contact the hospital operator. 3. Locate the [facility] provider you are looking for under [facility] and page to a number that you can be directly reached. 4. If you still have difficulty reaching the provider, please page the [facility] (Director on Call) for the Hospitalists listed on amion for assistance.  02/01/2020, 12:53 PM

## 2020-02-01 NOTE — Progress Notes (Signed)
Progress Note  Patient Name: Charles Gray Date of Encounter: 02/01/2020  [facility] Cardiologist: Berniece Salines, DO   Subjective   LEgs improving though still sore   Says several wks ago they were not swollen  Then torsemide was changed because it was felt he was dry   Backed down to daily then back up   Swelling started and didn't get better   Inpatient Medications    Scheduled Meds: . apixaban  5 mg Oral BID  . buprenorphine-naloxone  1 tablet Sublingual BID  . furosemide  80 mg Intravenous TID  . gabapentin  800 mg Oral TID  . metoprolol tartrate  50 mg Oral BID  . pantoprazole  40 mg Oral BID  . potassium chloride  40 mEq Oral Once  . pravastatin  20 mg Oral TID  . sodium chloride flush  3 mL Intravenous Q12H  . spironolactone  50 mg Oral Daily   Continuous Infusions: . sodium chloride     PRN Meds: sodium chloride, acetaminophen, ondansetron (ZOFRAN) IV, polyethylene glycol, senna-docusate, sodium chloride flush, zolpidem   Vital Signs    Vitals:   01/31/20 2239 01/31/20 2328 02/01/20 0352 02/01/20 0722  BP:  (!) 122/104 118/68 103/69  Pulse: 67 71 70 63  Resp: 14 18 15 14   Temp:  98.6 F (37 C) 97.7 F (36.5 C) 97.6 F (36.4 C)  TempSrc:  Oral Axillary Oral  SpO2:  96% 98% 97%  Weight:   (!) 166 kg   Height:        Intake/Output Summary (Last 24 hours) at 02/01/2020 0818 Last data filed at 02/01/2020 0656 Gross per 24 hour  Intake 200 ml  Output 2625 ml  Net -2425 ml   Net neg 11.8 L   Last 3 Weights 02/01/2020 01/31/2020 01/30/2020  Weight (lbs) 365 lb 14.4 oz 368 lb 6.2 oz 370 lb 6 oz  Weight (kg) 165.971 kg 167.1 kg 168 kg      Telemetry     SR  - Personally Reviewed  ECG    No new EKG done  - Personally Reviewed  Physical Exam   GEN: Morbidly obese 64 yo in no acute distress.   Neck: Neck is full   Cardiac: RRR,  Respiratory: Clear to auscultation bilaterally. GI: Soft, nontender, non-distended  MS: 1+ edema  \Legs  still wrapped  Neuro:  Nonfocal  Psych: Normal affect   Labs    High Sensitivity Troponin:  No results for input(s): TROPONINIHS in the last 720 hours.    Chemistry Recent Labs  Lab 01/26/20 1612 01/28/20 0104 01/29/20 0110 01/29/20 0110 01/30/20 0048 01/30/20 0048 01/31/20 0056 01/31/20 1623 02/01/20 0031  NA 139   < > 135   < > 134*   < > 136 135 134*  K 3.6   < > 3.3*   < > 3.3*   < > 2.7* 3.4* 2.7*  CL 97*   < > 90*   < > 89*   < > 89* 88* 87*  CO2 31   < > 31   < > 33*   < > 32 33* 32  GLUCOSE 112*   < > 113*   < > 135*   < > 126* 121* 145*  BUN 12   < > 18   < > 20   < > 25* 28* 27*  CREATININE 1.16   < > 1.02   < > 1.19   < > 1.16 1.22 1.24  CALCIUM 9.2   < > 9.7   < > 9.7   < > 9.7 9.7 9.4  PROT 7.2  --   --   --   --   --   --   --   --   ALBUMIN 3.7   < > 3.9  --  3.9  --  3.9  --   --   AST 42*  --   --   --   --   --   --   --   --   ALT 28  --   --   --   --   --   --   --   --   ALKPHOS 70  --   --   --   --   --   --   --   --   BILITOT 0.8  --   --   --   --   --   --   --   --   GFRNONAA >60   < > >60   < > >60   < > >60 >60 >60  ANIONGAP 11   < > 14   < > 12   < > 15 14 15    < > = values in this interval not displayed.     Hematology Recent Labs  Lab 01/28/20 0104 01/30/20 0048 02/01/20 0031  WBC 6.7 9.6 11.5*  RBC 4.75 5.29 5.36  HGB 11.8* 13.1 13.2  HCT 37.8* 42.4 42.7  MCV 79.6* 80.2 79.7*  MCH 24.8* 24.8* 24.6*  MCHC 31.2 30.9 30.9  RDW 15.8* 15.8* 15.9*  PLT 146* 168 210    BNP Recent Labs  Lab 01/27/20 1215  BNP 24.1     DDimer No results for input(s): DDIMER in the last 168 hours.   Radiology    VAS [place] ABI WITH/WO TBI  Result Date: 01/30/2020 LOWER EXTREMITY DOPPLER STUDY Indications: Diminished pulses. High Risk Factors: Hypertension.  Comparison Study: No prior studies. Performing Technologist: Carlos Levering Rvt  Examination Guidelines: A complete evaluation includes at minimum, Doppler waveform signals and systolic  blood pressure reading at the level of bilateral brachial, anterior tibial, and posterior tibial arteries, when vessel segments are accessible. Bilateral testing is considered an integral part of a complete examination. Photoelectric Plethysmograph (PPG) waveforms and toe systolic pressure readings are included as required and additional duplex testing as needed. Limited examinations for reoccurring indications may be performed as noted.  ABI Findings: +--------+------------------+-----+---------+--------+ Right   Rt Pressure (mmHg)IndexWaveform Comment  +--------+------------------+-----+---------+--------+ [id number]                    triphasic         +--------+------------------+-----+---------+--------+ PTA     161               1.10 triphasic         +--------+------------------+-----+---------+--------+ DP      149               1.02 biphasic          +--------+------------------+-----+---------+--------+ +--------+------------------+-----+---------+-------+ Left    Lt Pressure (mmHg)IndexWaveform Comment +--------+------------------+-----+---------+-------+ [id number]                    triphasic        +--------+------------------+-----+---------+-------+ PTA     188               1.29 triphasic        +--------+------------------+-----+---------+-------+  DP      149               1.02 biphasic         +--------+------------------+-----+---------+-------+  Summary: Right: Resting right ankle-brachial index is within normal range. No evidence of significant right lower extremity arterial disease. Left: Resting left ankle-brachial index is within normal range. No evidence of significant left lower extremity arterial disease.  *See table(s) above for measurements and observations.  Electronically signed by Curt Jews MD on 01/30/2020 at 4:23:44 PM.   Final     Cardiac Studies   R heart cath 07/2019 [facility] Procedural Findings: Hemodynamics (mmHg) RA mean  8 RV 21/8 PA 20/8, mean 14 PCWP mean 11  Oxygen saturations: PA 71% AO 100%  Cardiac Output (Fick) 8.5  Cardiac Index (Fick) 2.8  Echo 1. Suboptimal image quality for diagnostic purposes. 2. Left ventricular ejection fraction, by estimation, is 55 to 60%. The left ventricle has normal function. Left ventricular endocardial border not optimally defined to evaluate regional wall motion. Left ventricular diastolic parameters are consistent with Grade I diastolic dysfunction (impaired relaxation). 3. Right ventricular systolic function was not well visualized. The right ventricular size is not well visualized. 4. The mitral valve is grossly normal. No evidence of mitral valve regurgitation. No evidence of mitral stenosis. 5. The aortic valve was not well visualized. Aortic valve regurgitation is not visualized. No aortic stenosis is present.    Patient Profile     64 y.o. male with h of chronic R sided CHF, CAD (mild on CT), HTN, pulmoonary emboil and atrial fbrillation admitted for edema  Assessment & Plan    1  Acute on chronic diastolic CHF   Keep diuresing  Still with volume increase   2  HTN  BP is controlled      3  Atrial fibrillation   PAF  Currently remains in SR   4  Hx PE   Last winter had PE  5  Hx UGI bleed  Fall 2020  Required 3 U PRBC   Follow Hgb  Was 13/1 yesterday   6  Hx [name]    For questions or updates, please contact [facility] Please consult [email or web address] for contact info under        Signed, Dorris Carnes, MD  02/01/2020, 8:18 AM

## 2020-02-01 NOTE — Progress Notes (Signed)
Physical Therapy Treatment Patient Details Name: Charles Gray MRN: [id number] DOB: [birth year] Today's Date: 02/01/2020    History of Present Illness Pt adm with acute on chronic rt heart failure. PMH - chf, htn, osa, PE/DVT, afib, obesity, chronic pain,     PT Comments    On entry pt found to be laying forward on his tray table, sleeping, and rouses easily with therapist entrance. Pt with reports of increased shoulder pain L>R, cervical pain and LBP. Pt advised of increase forward posture and tightening of pectoral muscles and weakness of posterior cervical muscles.  Assisted pt in stretching and ROM exercise to improve pain with minimal success. Pt agreeable to ambulation however unable to increase ambulation distance due to pain. Returned to room and requested pain meds from RN. PT will continue to work with pt on posture and mobility progression.     Follow Up Recommendations  No PT follow up     Equipment Recommendations  None recommended by PT       Precautions / Restrictions Precautions Precautions: Fall Restrictions Weight Bearing Restrictions: No    Mobility  Bed Mobility               General bed mobility comments: Pt up in chair  Transfers Overall transfer level: Needs assistance Equipment used: Rolling walker (2 wheeled) Transfers: Sit to/from Stand Sit to Stand: Min guard         General transfer comment: Incr time and effort from low chair  Ambulation/Gait Ambulation/Gait assistance: Supervision Gait Distance (Feet): 150 Feet Assistive device: Rolling walker (2 wheeled) Gait Pattern/deviations: Step-through pattern;Decreased stride length;Trunk flexed;Wide base of support Gait velocity: decr   General Gait Details: limited by increased shoulder and back pain today, vc for upright posture          Balance Overall balance assessment: Needs assistance Sitting-balance support: No upper extremity supported;Feet supported Sitting  balance-Leahy Scale: Good     Standing balance support: No upper extremity supported;During functional activity Standing balance-Leahy Scale: Fair Standing balance comment: statically                            Cognition Arousal/Alertness: Awake/alert Behavior During Therapy: WFL for tasks assessed/performed Overall Cognitive Status: Within Functional Limits for tasks assessed                                        Exercises Other Exercises Other Exercises: PROM L shouder with pectoral stretch,  Other Exercises: cervical rotation, flexion AAROM     General Comments General comments (skin integrity, edema, etc.): VSS on RA      Pertinent Vitals/Pain Pain Assessment: Faces Faces Pain Scale: Hurts even more Pain Location: back, L shoulder Pain Descriptors / Indicators: Grimacing Pain Intervention(s): Limited activity within patient's tolerance;Monitored during session;Repositioned;Patient requesting pain meds-RN notified           PT Goals (current goals can now be found in the care plan section) Acute Rehab PT Goals Patient Stated Goal: return home PT Goal Formulation: With patient Time For Goal Achievement: 02/11/20 Potential to Achieve Goals: Good Progress towards PT goals: Not progressing toward goals - comment (limited by pain today )    Frequency    Min 3X/week      PT Plan Current plan remains appropriate       AM-PAC PT "6 Clicks" Mobility  Outcome Measure  Help needed turning from your back to your side while in a flat bed without using bedrails?: None Help needed moving from lying on your back to sitting on the side of a flat bed without using bedrails?: None Help needed moving to and from a bed to a chair (including a wheelchair)?: A Little Help needed standing up from a chair using your arms (e.g., wheelchair or bedside chair)?: A Little Help needed to walk in hospital room?: A Little Help needed climbing 3-5 steps  with a railing? : A Little 6 Click Score: 20    End of Session Equipment Utilized During Treatment: Gait belt Activity Tolerance: Patient limited by fatigue Patient left: in chair;with call bell/phone within reach Nurse Communication: Mobility status PT Visit Diagnosis: Other abnormalities of gait and mobility (R26.89);Muscle weakness (generalized) (M62.81)     Time: 1275-1700 PT Time Calculation (min) (ACUTE ONLY): 13 min  Charges:  $Therapeutic Exercise: 8-22 mins                     Caymen Dubray B. Migdalia Dk PT, DPT Acute Rehabilitation Services Pager [phone] Office [phone]    [place] 02/01/2020, 5:13 PM

## 2020-02-01 NOTE — Progress Notes (Signed)
Pt does not want to wear CPAP tonight.

## 2020-02-02 DIAGNOSIS — I50813 Acute on chronic right heart failure: Secondary | ICD-10-CM | POA: Diagnosis not present

## 2020-02-02 LAB — BASIC METABOLIC PANEL
Anion gap: 14 (ref 5–15)
BUN: 26 mg/dL — ABNORMAL HIGH (ref 8–23)
CO2: 30 mmol/L (ref 22–32)
Calcium: 9.5 mg/dL (ref 8.9–10.3)
Chloride: 91 mmol/L — ABNORMAL LOW (ref 98–111)
Creatinine, Ser: 1.14 mg/dL (ref 0.61–1.24)
GFR, Estimated: >60 mL/min (ref >60)
Glucose, Bld: 126 mg/dL — ABNORMAL HIGH (ref 70–99)
Potassium: 3.1 mmol/L — ABNORMAL LOW (ref 3.5–5.1)
Sodium: 135 mmol/L (ref 135–145)

## 2020-02-02 LAB — CBC
HCT: 41.8 % (ref 39.0–52.0)
Hemoglobin: 13 g/dL (ref 13.0–17.0)
MCH: 24.9 pg — ABNORMAL LOW (ref 26.0–34.0)
MCHC: 31.1 g/dL (ref 30.0–36.0)
MCV: 79.9 fL — ABNORMAL LOW (ref 80.0–100.0)
Platelets: 194 10*3/uL (ref 150–400)
RBC: 5.23 MIL/uL (ref 4.22–5.81)
RDW: 15.7 % — ABNORMAL HIGH (ref 11.5–15.5)
WBC: 9.3 10*3/uL (ref 4.0–10.5)
nRBC: 0 % (ref 0.0–0.2)

## 2020-02-02 MED ORDER — POTASSIUM CHLORIDE CRYS ER 20 MEQ PO TBCR
40.0000 meq | EXTENDED_RELEASE_TABLET | Freq: Once | ORAL | Status: AC
  Administered 2020-02-02: 40 meq via ORAL

## 2020-02-02 MED ORDER — METOLAZONE 2.5 MG PO TABS
2.5000 mg | ORAL_TABLET | Freq: Every day | ORAL | Status: DC
  Administered 2020-02-02 – 2020-02-05 (×4): 2.5 mg via ORAL
  Filled 2020-02-02 (×4): qty 1

## 2020-02-02 NOTE — Progress Notes (Addendum)
Progress Note  Patient Name: Charles Gray Date of Encounter: 02/02/2020  [facility] Cardiologist: Berniece Salines, DO   Subjective   Legs still hurt Breathing ok  NoCP  Inpatient Medications    Scheduled Meds: . apixaban  5 mg Oral BID  . buprenorphine-naloxone  1 tablet Sublingual BID  . furosemide  80 mg Intravenous TID  . gabapentin  800 mg Oral TID  . metoprolol tartrate  50 mg Oral BID  . pantoprazole  40 mg Oral BID  . potassium chloride  40 mEq Oral TID  . pravastatin  20 mg Oral TID  . sodium chloride flush  3 mL Intravenous Q12H  . spironolactone  50 mg Oral Daily   Continuous Infusions: . sodium chloride     PRN Meds: sodium chloride, acetaminophen, ondansetron (ZOFRAN) IV, polyethylene glycol, senna-docusate, sodium chloride flush, zolpidem   Vital Signs    Vitals:   02/01/20 1627 02/01/20 1914 02/01/20 2312 02/02/20 0328  BP: 126/87 126/75 118/79 107/75  Pulse: 72  69 65  Resp: 12 15 12 11   Temp: 98.1 F (36.7 C) 98.2 F (36.8 C) 98.3 F (36.8 C) 98.3 F (36.8 C)  TempSrc: Oral Oral Oral Oral  SpO2: 98% 95% 95% 93%  Weight:    (!) 165.2 kg  Height:        Intake/Output Summary (Last 24 hours) at 02/02/2020 0635 Last data filed at 02/02/2020 0334 Gross per 24 hour  Intake 930 ml  Output 3500 ml  Net -2570 ml   Net neg 13.7 L   Last 3 Weights 02/02/2020 02/01/2020 01/31/2020  Weight (lbs) 364 lb 1.6 oz 365 lb 14.4 oz 368 lb 6.2 oz  Weight (kg) 165.155 kg 165.971 kg 167.1 kg      Telemetry    SR- Personally Reviewed  ECG    No new EKG done  - Personally Reviewed  Physical Exam   GEN: Morbidly obese 64 yo in no acute distress.   Neck: Neck is full   Cardiac: RRR  No S3   Respiratory: Clear to auscultation bilaterally. GI: Soft, nontender, non-distended  MS: 1+ edema  \Legs still wrapped  Neuro:  Nonfocal  Psych: Normal affect   Labs    High Sensitivity Troponin:  No results for input(s): TROPONINIHS in the last 720  hours.    Chemistry Recent Labs  Lab 01/26/20 1612 01/28/20 0104 01/29/20 0110 01/29/20 0110 01/30/20 0048 01/30/20 0048 01/31/20 0056 01/31/20 1623 02/01/20 0031 02/01/20 1219 02/02/20 0059  NA 139   < > 135   < > 134*   < > 136   < > 134* 133* 135  K 3.6   < > 3.3*   < > 3.3*   < > 2.7*   < > 2.7* 3.4* 3.1*  CL 97*   < > 90*   < > 89*   < > 89*   < > 87* 87* 91*  CO2 31   < > 31   < > 33*   < > 32   < > 32 32 30  GLUCOSE 112*   < > 113*   < > 135*   < > 126*   < > 145* 175* 126*  BUN 12   < > 18   < > 20   < > 25*   < > 27* 26* 26*  CREATININE 1.16   < > 1.02   < > 1.19   < > 1.16   < >  1.24 1.17 1.14  CALCIUM 9.2   < > 9.7   < > 9.7   < > 9.7   < > 9.4 9.4 9.5  PROT 7.2  --   --   --   --   --   --   --   --   --   --   ALBUMIN 3.7   < > 3.9  --  3.9  --  3.9  --   --   --   --   AST 42*  --   --   --   --   --   --   --   --   --   --   ALT 28  --   --   --   --   --   --   --   --   --   --   ALKPHOS 70  --   --   --   --   --   --   --   --   --   --   BILITOT 0.8  --   --   --   --   --   --   --   --   --   --   GFRNONAA >60   < > >60   < > >60   < > >60   < > >60 >60 >60  ANIONGAP 11   < > 14   < > 12   < > 15   < > 15 14 14    < > = values in this interval not displayed.     Hematology Recent Labs  Lab 01/30/20 0048 02/01/20 0031 02/02/20 0059  WBC 9.6 11.5* 9.3  RBC 5.29 5.36 5.23  HGB 13.1 13.2 13.0  HCT 42.4 42.7 41.8  MCV 80.2 79.7* 79.9*  MCH 24.8* 24.6* 24.9*  MCHC 30.9 30.9 31.1  RDW 15.8* 15.9* 15.7*  PLT 168 210 194    BNP Recent Labs  Lab 01/27/20 1215  BNP 24.1     DDimer No results for input(s): DDIMER in the last 168 hours.   Radiology    No results found.  Cardiac Studies   R heart cath 07/2019  R[facility] Procedural Findings: Hemodynamics (mmHg) RA mean 8 RV 21/8 PA 20/8, mean 14 PCWP mean 11  Oxygen saturations: PA 71% AO 100%  Cardiac Output (Fick) 8.5  Cardiac Index (Fick) 2.8  Echo 08/07/19  1. Suboptimal image  quality for diagnostic purposes. 2. Left ventricular ejection fraction, by estimation, is 55 to 60%. The left ventricle has normal function. Left ventricular endocardial border not optimally defined to evaluate regional wall motion. Left ventricular diastolic parameters are consistent with Grade I diastolic dysfunction (impaired relaxation). 3. Right ventricular systolic function was not well visualized. The right ventricular size is not well visualized. 4. The mitral valve is grossly normal. No evidence of mitral valve regurgitation. No evidence of mitral stenosis. 5. The aortic valve was not well visualized. Aortic valve regurgitation is not visualized. No aortic stenosis is present.    Patient Profile     64 y.o. male with h of chronic R sided CHF, CAD (mild on CT), HTN, pulmoonary emboil and atrial fbrillation admitted for edema  Assessment & Plan    1  LE edema  / Diastolic CHF   Contributions from Drug (cardiazem) plus diastolic CHF    Review of note by K Tobb  Pt started on  Cardiazem in September 2021 when found to be in afib   Developed edema  Cardiazem stopped   Torsemide increased to 40 mg BID   Swelling did not improve when seen in clinic on 10.15.21  He was admitted after this clnic visit for IV therapy   Pt had echoes, last in April  I have reviewed images   Hard to see RV but I am not convinced it is abnormal/down.   R heart cath in April showed normal R sided pressures  Would like to get a little better volume status before d/c since he was resistent to torsemide changes   Add Zaroxylyn 2.5 mg daily     Agree with K repletion, rampng up K supplemnet  Dietary to review with low na diet    2  HTN  BP is controlled      3  Atrial fibrillation   Diagnosed with PAF in September 2021  Has been in SR  COntinue EliquisPossible  EP referral if has recurrent afib with RVR    4  Hx PE   Last winter had PE   Rx Eliquis   5  Hx CAD  CT coronary angiogram done in July   Mild to  mod CAD    5 OSA  Has CPAP at home   Cant tolerate mask here   Stressed importance of use in above settings of diastolic CHF and afib   6 Hx UGI bleed requiring transfusions  Fall 2020   Follow H and H  Today Hgb is 13  6  Hx [name] Morbidly obese   Diet, by his report, does not sound bad.   7  HL  On pravastatin   For questions or updates, please contact [facility] Please consult [email or web address] for contact info under        Signed, Dorris Carnes, MD  02/02/2020, 6:35 AM

## 2020-02-02 NOTE — TOC Initial Note (Addendum)
Transition of Care [facility]) - Initial/Assessment Note    Patient Details  Name: Charles Gray MRN: [id number] Date of Birth: [birth year]  Transition of Care [facility]) CM/SW Contact:    Zenon Mayo, RN Phone Number: 02/02/2020, 5:04 PM  Clinical Narrative:                 Patient is from home alone, he would like a wide rollator, NCM made referral to [facility] with [employer] for the rollator and he will need HHRN and HHAIDE.  NCM offered choice, he does not have a preference.  NCM made referral to [facility] for HHRN/ HHAIDE, she will check and get back with NCM on Weekend.    10/26- Per Helene Kelp with [facility] they can take referral .     Expected Discharge Plan: [place] Barriers to Discharge: Continued Medical Work up   Patient Goals and CMS Choice Patient states their goals for this hospitalization and ongoing recovery are:: get better CMS Medicare.gov Compare Post Acute Care list provided to:: Patient Choice offered to / list presented to : Patient  Expected Discharge Plan and Services Expected Discharge Plan: [place]   Discharge Planning Services: CM Consult Post Acute Care Choice: Home Health, Durable Medical Equipment Living arrangements for the past 2 months: Apartment                 DME Arranged: Walker rolling with seat DME Agency: [employer] Date DME Agency Contacted: 02/02/20 Time DME Agency Contacted: [phone] Representative spoke with at DME Agency: [place] Arranged: RN, Nurse's Aide [place] Agency: [facility] (formerly [employer]) Date [place]: 02/02/20 Time [place]: 1703 Representative spoke with at [place]: [place] Arrangements/Services Living arrangements for the past 2 months: [place] with:: Self Patient language and need for interpreter reviewed:: Yes Do you feel safe going back to the place where you live?: Yes      Need for Family Participation in Patient  Care: No (Comment) Care giver support system in place?: No (comment)   Criminal Activity/Legal Involvement Pertinent to Current Situation/Hospitalization: No - Comment as needed  Activities of Daily Living      Permission Sought/Granted                  Emotional Assessment   Attitude/Demeanor/Rapport: Engaged Affect (typically observed): Appropriate Orientation: : Oriented to Self, Oriented to Place, Oriented to  Time, Oriented to Situation Alcohol / Substance Use: Not Applicable Psych Involvement: No (comment)  Admission diagnosis:  Acute on chronic diastolic CHF (congestive heart failure) (HCC) [I50.33] Acute on chronic right-sided heart failure ([place]) [I50.813] Acute on chronic right heart failure ([place]) [I50.813] Patient Active Problem List   Diagnosis Date Noted  . Acute on chronic right heart failure ([place]) 01/27/2020  . Acute respiratory failure with hypoxia ([place])   . Fatty liver disease, nonalcoholic   . Shock ([place])   . Personal history of DVT (deep vein thrombosis) 12/26/2019  . Chronic anticoagulation 12/26/2019  . Palpitations 12/26/2019  . Atrial fibrillation ([place]) 12/23/2019  . Heart failure, systolic, acute ([place]) 24/40/1027  . Acute on chronic congestive heart failure ([place])   . Acute on chronic diastolic CHF (congestive heart failure) ([place]) 08/23/2019  . Redness and swelling of lower leg 08/23/2019  . Chest pain 08/23/2019  . Lactic acidosis 08/23/2019  . History of pulmonary embolism 08/12/2019  . Atrial tachycardia ([place]) 08/12/2019  . Occasional tremors 08/12/2019  .  OSA (obstructive sleep apnea) 08/12/2019  . Acute on chronic right-sided heart failure ([place]) 08/06/2019  . Pulmonary embolism ([place]) 06/08/2019  . Normocytic anemia 06/08/2019  . Pulmonary emboli ([place]) 06/08/2019  . Chronic heart failure with preserved ejection fraction ([place]) 05/30/2019  . Class 2 severe obesity due to excess calories with serious comorbidity and body mass index (BMI) of  39.0 to 39.9 in adult ([place]) 05/30/2019  . Morbid obesity with BMI of 40.0-44.9, adult ([place]) 05/30/2019  . Hyponatremia   . Benign essential HTN   . Drug induced constipation   . Peripheral edema   . Hypotension due to drugs   . Hypoalbuminemia due to protein-calorie malnutrition ([place])   . Hypokalemia   . Thrombocytopenia ([place])   . Anemia of chronic disease   . Cirrhosis of liver without ascites ([place])   . Chronic pain syndrome   . Debility 03/13/2019  . Portal hypertensive gastropathy ([place]) 03/06/2019  . Dyspnea   . GIB (gastrointestinal bleeding) 03/04/2019  . Mixed hyperlipidemia 01/24/2019  . Insomnia 01/24/2019  . Hyperlipidemia 01/24/2019  . Hypertension 01/24/2019  . Lumbar disc herniation 01/24/2019   PCP:  Enid Skeens., MD Pharmacy:   [facility], [place] [address] Phone: [phone] Fax: [place], [place] [address] [place] [address] Phone: [phone] Fax: [phone]     Social Determinants of Health (SDOH) Interventions Food Insecurity Interventions: Intervention Not Indicated Transportation Interventions: [employer], Other (Comment) (sometimes his insurance provides transportation)  Readmission Risk Interventions Readmission Risk Prevention Plan 02/02/2020 08/28/2019 08/08/2019  Transportation Screening Complete Complete Complete  HRI or Home Care Consult - - Complete  Social Work Consult for [place] Planning/Counseling - - Complete  Medication Review Press photographer) Complete Complete Complete  HRI or Home Care Consult Complete - -  SW Recovery Care/Counseling Consult Complete Complete -  Palliative Care Screening Not Applicable Not Applicable -  [place] Not Applicable Not Applicable -

## 2020-02-02 NOTE — Progress Notes (Signed)
Progress Note    Charles Gray  SVX:[id number] DOB: [birth year]  DOA: 01/26/2020 PCP: Enid Skeens., MD    Brief Narrative:     Medical records reviewed and are as summarized below:  Charles Gray is an 64 y.o. male with history of paroxysmal A. fib on Eliquis, probable chronic right heart failure, hypertension, possible cirrhosis, OSA and PE/DVT in 05/2019 direct up to ED by his cardiologist for refractory bilateral lower extremity edema and weight gain.  Started on IV Lasix and admitted for fluid overload/bilateral lower extremity edema and concern for acute on chronic right heart failure.  cardiology consulted, and recommended IV diuresis.  Assessment/Plan:   Principal Problem:   Acute on chronic right-sided heart failure (HCC) Active Problems:   Benign essential HTN   OSA (obstructive sleep apnea)   Atrial fibrillation (HCC)   Acute on chronic right heart failure (HCC)   Acute on chronic diastolic CHF  -diuretics per cardiology -ABIs ok-- UNNA boot placed -Leg elevation, fluid and sodium restriction -daily weights/strict I/Os -down 13.7L thus far  Paroxysmal atrial fibrillation:  -Continue Eliquis for anticoagulation. -Started on metoprolol  OSA on CPAP -Refusing CPAP. -not compliant at home either  History of DVT/PE -Continue Eliquis  Essential hypertension: Normotensive -Now on metoprolol and diuretics as above  Chronic pain syndrome: Stable -Continue home Suboxone and gabapentin  Thrombocytopenia:  -Resolved. -Continue monitoring  Hypokalemia:  -replete aggressively   Morbid obesity Body mass index is 38.07 kg/m.   Family Communication/Anticipated D/C date and plan/Code Status   DVT prophylaxis: eliquis Code Status: Full Code.  Disposition Plan: Status is: Inpatient  Remains inpatient appropriate because:IV treatments appropriate due to intensity of illness or inability to take PO   Dispo: The patient is from: Home               Anticipated d/c is to: Home              Anticipated d/c date is: 2 days              Patient currently is not medically stable to d/c.         Medical Consultants:    cards  Subjective:   Tried to wear CPAP last PM  Objective:    Vitals:   02/01/20 2312 02/02/20 0328 02/02/20 0905 02/02/20 1125  BP: 118/79 107/75 119/82 106/68  Pulse: 69 65 65 62  Resp: 12 11 10 11   Temp: 98.3 F (36.8 C) 98.3 F (36.8 C) (!) 97.5 F (36.4 C) 98.7 F (37.1 C)  TempSrc: Oral Oral Oral Oral  SpO2: 95% 93% 96% 96%  Weight:  (!) 165.2 kg    Height:        Intake/Output Summary (Last 24 hours) at 02/02/2020 1259 Last data filed at 02/02/2020 2330 Gross per 24 hour  Intake 1082 ml  Output 2600 ml  Net -1518 ml   Filed Weights   01/31/20 0348 02/01/20 0352 02/02/20 0328  Weight: (!) 167.1 kg (!) 166 kg (!) 165.2 kg    Exam:  General: Appearance:    Obese male in no acute distress     Lungs:     Not on O2 respirations unlabored  Heart:    Normal heart rate. No murmurs, rubs, or gallops.   MS:   All extremities are intact. Wearing unna boots  Neurologic:   Awake, alert, oriented x 3. No apparent focal neurological           defect.  Data Reviewed:   I have personally reviewed following labs and imaging studies:  Labs: Labs show the following:   Basic Metabolic Panel: Recent Labs  Lab 01/28/20 0104 01/28/20 0104 01/29/20 0110 01/29/20 0110 01/30/20 3428 01/30/20 7681 01/31/20 0056 01/31/20 0056 01/31/20 1623 01/31/20 1623 02/01/20 0031 02/01/20 0031 02/01/20 1219 02/02/20 0059  NA 135   < > 135   < > 134*   < > 136  --  135  --  134*  --  133* 135  K 3.0*   < > 3.3*   < > 3.3*   < > 2.7*   < > 3.4*   < > 2.7*   < > 3.4* 3.1*  CL 92*   < > 90*   < > 89*   < > 89*  --  88*  --  87*  --  87* 91*  CO2 32   < > 31   < > 33*   < > 32  --  33*  --  32  --  32 30  GLUCOSE 118*   < > 113*   < > 135*   < > 126*  --  121*  --  145*  --  175* 126*   BUN 17   < > 18   < > 20   < > 25*  --  28*  --  27*  --  26* 26*  CREATININE 1.08   < > 1.02   < > 1.19   < > 1.16  --  1.22  --  1.24  --  1.17 1.14  CALCIUM 9.4   < > 9.7   < > 9.7   < > 9.7  --  9.7  --  9.4  --  9.4 9.5  MG 2.2  --  2.3  --  2.2  --  2.2  --   --   --   --   --   --   --   PHOS 4.4  --  3.6  --  3.7  --  4.0  --   --   --   --   --   --   --    < > = values in this interval not displayed.   GFR Estimated Creatinine Clearance: 117 mL/min (by C-G formula based on SCr of 1.14 mg/dL). Liver Function Tests: Recent Labs  Lab 01/26/20 1612 01/28/20 0104 01/29/20 0110 01/30/20 0048 01/31/20 0056  AST 42*  --   --   --   --   ALT 28  --   --   --   --   ALKPHOS 70  --   --   --   --   BILITOT 0.8  --   --   --   --   PROT 7.2  --   --   --   --   ALBUMIN 3.7 3.7 3.9 3.9 3.9   No results for input(s): LIPASE, AMYLASE in the last 168 hours. No results for input(s): AMMONIA in the last 168 hours. Coagulation profile No results for input(s): INR, PROTIME in the last 168 hours.  CBC: Recent Labs  Lab 01/26/20 1612 01/28/20 0104 01/30/20 0048 02/01/20 0031 02/02/20 0059  WBC 7.6 6.7 9.6 11.5* 9.3  NEUTROABS 6.1  --   --   --   --   HGB 11.7* 11.8* 13.1 13.2 13.0  HCT 40.0 37.8* 42.4 42.7 41.8  MCV 83.7 79.6* 80.2  79.7* 79.9*  PLT 133* 146* 168 210 194   Cardiac Enzymes: No results for input(s): CKTOTAL, CKMB, CKMBINDEX, TROPONINI in the last 168 hours. BNP (last 3 results) Recent Labs    02/27/19 1106  PROBNP 37   CBG: No results for input(s): GLUCAP in the last 168 hours. D-Dimer: No results for input(s): DDIMER in the last 72 hours. Hgb A1c: No results for input(s): HGBA1C in the last 72 hours. Lipid Profile: No results for input(s): CHOL, HDL, LDLCALC, TRIG, CHOLHDL, LDLDIRECT in the last 72 hours. Thyroid function studies: No results for input(s): TSH, T4TOTAL, T3FREE, THYROIDAB in the last 72 hours.  Invalid input(s): FREET3 Anemia work  up: No results for input(s): VITAMINB12, FOLATE, FERRITIN, TIBC, IRON, RETICCTPCT in the last 72 hours. Sepsis Labs: Recent Labs  Lab 01/28/20 0104 01/30/20 0048 02/01/20 0031 02/02/20 0059  WBC 6.7 9.6 11.5* 9.3    Microbiology Recent Results (from the past 240 hour(s))  Respiratory Panel by RT PCR (Flu A&B, Covid) - Nasopharyngeal Swab     Status: None   Collection Time: 01/27/20 12:16 AM   Specimen: Nasopharyngeal Swab  Result Value Ref Range Status   SARS Coronavirus 2 by RT PCR NEGATIVE NEGATIVE Final    Comment: (NOTE) SARS-CoV-2 target nucleic acids are NOT DETECTED.  The SARS-CoV-2 RNA is generally detectable in upper respiratoy specimens during the acute phase of infection. The lowest concentration of SARS-CoV-2 viral copies this assay can detect is 131 copies/mL. A negative result does not preclude SARS-Cov-2 infection and should not be used as the sole basis for treatment or other patient management decisions. A negative result may occur with  improper specimen collection/handling, submission of specimen other than nasopharyngeal swab, presence of viral mutation(s) within the areas targeted by this assay, and inadequate number of viral copies (<131 copies/mL). A negative result must be combined with clinical observations, patient history, and epidemiological information. The expected result is Negative.  Fact Sheet for Patients:  PinkCheek.be  Fact Sheet for Healthcare Providers:  GravelBags.it  This test is no t yet approved or cleared by the [place] FDA and  has been authorized for detection and/or diagnosis of SARS-CoV-2 by FDA under an Emergency Use Authorization (EUA). This EUA will remain  in effect (meaning this test can be used) for the duration of the COVID-19 declaration under Section 564(b)(1) of the Act, 21 U.S.C. section 360bbb-3(b)(1), unless the authorization is terminated  or revoked sooner.     Influenza A by PCR NEGATIVE NEGATIVE Final   Influenza B by PCR NEGATIVE NEGATIVE Final    Comment: (NOTE) The Xpert Xpress SARS-CoV-2/FLU/RSV assay is intended as an aid in  the diagnosis of influenza from Nasopharyngeal swab specimens and  should not be used as a sole basis for treatment. Nasal washings and  aspirates are unacceptable for Xpert Xpress SARS-CoV-2/FLU/RSV  testing.  Fact Sheet for Patients: PinkCheek.be  Fact Sheet for Healthcare Providers: GravelBags.it  This test is not yet approved or cleared by the [place] FDA and  has been authorized for detection and/or diagnosis of SARS-CoV-2 by  FDA under an Emergency Use Authorization (EUA). This EUA will remain  in effect (meaning this test can be used) for the duration of the  Covid-19 declaration under Section 564(b)(1) of the Act, 21  U.S.C. section 360bbb-3(b)(1), unless the authorization is  terminated or revoked. Performed at [facility], [place] [address] [place], [place] [address]   MRSA PCR Screening     Status:  None   Collection Time: 01/27/20  6:46 PM   Specimen: Nasal Mucosa; Nasopharyngeal  Result Value Ref Range Status   MRSA by PCR NEGATIVE NEGATIVE Final    Comment:        The GeneXpert MRSA Assay (FDA approved for NASAL specimens only), is one component of a comprehensive MRSA colonization surveillance program. It is not intended to diagnose MRSA infection nor to guide or monitor treatment for MRSA infections. Performed at [facility], [place] [address] [place], [place] [address]     Procedures and diagnostic studies:  No results found.  Medications:    apixaban  5 mg Oral BID   buprenorphine-naloxone  1 tablet Sublingual BID   furosemide  80 mg Intravenous TID   gabapentin  800 mg Oral TID   metolazone  2.5 mg Oral Daily   metoprolol tartrate  50 mg Oral BID   pantoprazole  40  mg Oral BID   potassium chloride  40 mEq Oral TID   pravastatin  20 mg Oral TID   sodium chloride flush  3 mL Intravenous Q12H   spironolactone  50 mg Oral Daily   Continuous Infusions:  sodium chloride       LOS: 6 days   Geradine Girt  [facility]   How to contact the [facility] Attending or Consulting provider [place] or covering provider during after hours [place], for this patient?  1. Check the care team in [facility] and look for a) attending/consulting TR[facility] provider listed and b) the [facility] team listed 2. Log into [email or web address] and use [place]'s universal password to access. If you do not have the password, please contact the hospital operator. 3. Locate the [facility] provider you are looking for under [facility] and page to a number that you can be directly reached. 4. If you still have difficulty reaching the provider, please page the [facility] (Director on Call) for the Hospitalists listed on amion for assistance.  02/02/2020, 12:59 PM

## 2020-02-02 NOTE — Progress Notes (Signed)
Orthopedic Tech Progress Note Patient Details:  Trampas Stettner July 13, 1955 [id number]  Ortho Devices Type of Ortho Device: Haematologist Ortho Device/Splint Location: bi-lteral. applied with daye's help Ortho Device/Splint Interventions: Ordered, Application, Adjustment   Post Interventions Patient Tolerated: Well Instructions Provided: Care of device, Adjustment of device   Karolee Stamps 02/02/2020, 4:11 PM

## 2020-02-02 NOTE — Progress Notes (Signed)
Does not want bipap at this time

## 2020-02-02 NOTE — Progress Notes (Signed)
UNNA wraps removed (placed on 10/19), legs washed with soap and water and assessed by this RN. Ortho tech paged to re-apply UNNA wraps.

## 2020-02-03 DIAGNOSIS — I50813 Acute on chronic right heart failure: Secondary | ICD-10-CM | POA: Diagnosis not present

## 2020-02-03 DIAGNOSIS — I480 Paroxysmal atrial fibrillation: Secondary | ICD-10-CM | POA: Diagnosis not present

## 2020-02-03 DIAGNOSIS — G4733 Obstructive sleep apnea (adult) (pediatric): Secondary | ICD-10-CM | POA: Diagnosis not present

## 2020-02-03 LAB — BASIC METABOLIC PANEL
Anion gap: 11 (ref 5–15)
BUN: 30 mg/dL — ABNORMAL HIGH (ref 8–23)
CO2: 35 mmol/L — ABNORMAL HIGH (ref 22–32)
Calcium: 9.8 mg/dL (ref 8.9–10.3)
Chloride: 90 mmol/L — ABNORMAL LOW (ref 98–111)
Creatinine, Ser: 1.22 mg/dL (ref 0.61–1.24)
GFR, Estimated: >60 mL/min (ref >60)
Glucose, Bld: 144 mg/dL — ABNORMAL HIGH (ref 70–99)
Potassium: 3.8 mmol/L (ref 3.5–5.1)
Sodium: 136 mmol/L (ref 135–145)

## 2020-02-03 LAB — CBC
HCT: 43.8 % (ref 39.0–52.0)
Hemoglobin: 13.5 g/dL (ref 13.0–17.0)
MCH: 24.8 pg — ABNORMAL LOW (ref 26.0–34.0)
MCHC: 30.8 g/dL (ref 30.0–36.0)
MCV: 80.4 fL (ref 80.0–100.0)
Platelets: 192 10*3/uL (ref 150–400)
RBC: 5.45 MIL/uL (ref 4.22–5.81)
RDW: 15.9 % — ABNORMAL HIGH (ref 11.5–15.5)
WBC: 9.8 10*3/uL (ref 4.0–10.5)
nRBC: 0 % (ref 0.0–0.2)

## 2020-02-03 MED ORDER — PRAVASTATIN SODIUM 40 MG PO TABS
20.0000 mg | ORAL_TABLET | Freq: Every day | ORAL | Status: DC
  Administered 2020-02-04 – 2020-02-08 (×4): 20 mg via ORAL
  Filled 2020-02-03 (×4): qty 1

## 2020-02-03 NOTE — Progress Notes (Signed)
Progress Note    Charles Gray  KGU:[id number] DOB: 23-Apr-1955  DOA: 01/26/2020 PCP: Enid Skeens., MD    Brief Narrative:     Medical records reviewed and are as summarized below:  Charles Gray is an 64 y.o. male with history of paroxysmal A. fib on Eliquis, probable chronic right heart failure, hypertension, possible cirrhosis, OSA and PE/DVT in 05/2019 direct up to ED by his cardiologist for refractory bilateral lower extremity edema and weight gain.  Started on IV Lasix and admitted for fluid overload/bilateral lower extremity edema and concern for acute on chronic right heart failure.  cardiology consulted, and recommended IV diuresis.  Assessment/Plan:   Principal Problem:   Acute on chronic right-sided heart failure (HCC) Active Problems:   Benign essential HTN   OSA (obstructive sleep apnea)   Atrial fibrillation (HCC)   Acute on chronic right heart failure (HCC)   Acute on chronic diastolic CHF  -diuretics per cardiology -ABIs ok-- UNNA boot placed -Leg elevation, fluid and sodium restriction -daily weights/strict I/Os -down 14.5L thus far -suspect at home fluid restriction will be an issue  Paroxysmal atrial fibrillation:  -Continue Eliquis for anticoagulation. -Started on metoprolol  OSA on CPAP -Refusing CPAP. -not compliant at home either  History of DVT/PE -Continue Eliquis  Essential hypertension: Normotensive -Now on metoprolol and diuretics as above  Chronic pain syndrome: Stable -Continue home Suboxone and gabapentin  Thrombocytopenia:  -Resolved. -Continue monitoring  Hypokalemia:  -replete aggressively   Morbid obesity Body mass index is 37.97 kg/m.   Family Communication/Anticipated D/C date and plan/Code Status   DVT prophylaxis: eliquis Code Status: Full Code.  Disposition Plan: Status is: Inpatient  Remains inpatient appropriate because:IV treatments appropriate due to intensity of illness or inability  to take PO   Dispo: The patient is from: Home              Anticipated d/c is to: Home              Anticipated d/c date is: 2 days              Patient currently is not medically stable to d/c.         Medical Consultants:    cards  Subjective:   Asking for something to drink  Objective:    Vitals:   02/02/20 2347 02/03/20 0321 02/03/20 0424 02/03/20 0847  BP: 118/82 90/62  122/78  Pulse: 67 67    Resp: 15 14    Temp: 98.8 F (37.1 C) 98.5 F (36.9 C)  98.8 F (37.1 C)  TempSrc: Oral Oral  Oral  SpO2: 92% 94%    Weight:   (!) 164.7 kg   Height:        Intake/Output Summary (Last 24 hours) at 02/03/2020 1407 Last data filed at 02/03/2020 1151 Gross per 24 hour  Intake 1324 ml  Output 3375 ml  Net -2051 ml   Filed Weights   02/01/20 0352 02/02/20 0328 02/03/20 0424  Weight: (!) 166 kg (!) 165.2 kg (!) 164.7 kg    Exam:    General: Appearance:    Obese male in no acute distress     Lungs:     respirations unlabored  Heart:    Normal heart rate.  MS:   All extremities are intact.   Neurologic:   Awake, alert, oriented x 3. No apparent focal neurological           defect.  Data Reviewed:   I have personally reviewed following labs and imaging studies:  Labs: Labs show the following:   Basic Metabolic Panel: Recent Labs  Lab 01/28/20 0104 01/28/20 0104 01/29/20 0110 01/29/20 0110 01/30/20 6222 01/30/20 9798 01/31/20 0056 01/31/20 0056 01/31/20 1623 01/31/20 1623 02/01/20 0031 02/01/20 0031 02/01/20 1219 02/01/20 1219 02/02/20 0059 02/03/20 0140  NA 135   < > 135   < > 134*   < > 136   < > 135  --  134*  --  133*  --  135 136  K 3.0*   < > 3.3*   < > 3.3*   < > 2.7*   < > 3.4*   < > 2.7*   < > 3.4*   < > 3.1* 3.8  CL 92*   < > 90*   < > 89*   < > 89*   < > 88*  --  87*  --  87*  --  91* 90*  CO2 32   < > 31   < > 33*   < > 32   < > 33*  --  32  --  32  --  30 35*  GLUCOSE 118*   < > 113*   < > 135*   < > 126*   < > 121*  --   145*  --  175*  --  126* 144*  BUN 17   < > 18   < > 20   < > 25*   < > 28*  --  27*  --  26*  --  26* 30*  CREATININE 1.08   < > 1.02   < > 1.19   < > 1.16   < > 1.22  --  1.24  --  1.17  --  1.14 1.22  CALCIUM 9.4   < > 9.7   < > 9.7   < > 9.7   < > 9.7  --  9.4  --  9.4  --  9.5 9.8  MG 2.2  --  2.3  --  2.2  --  2.2  --   --   --   --   --   --   --   --   --   PHOS 4.4  --  3.6  --  3.7  --  4.0  --   --   --   --   --   --   --   --   --    < > = values in this interval not displayed.   GFR Estimated Creatinine Clearance: 109.2 mL/min (by C-G formula based on SCr of 1.22 mg/dL). Liver Function Tests: Recent Labs  Lab 01/28/20 0104 01/29/20 0110 01/30/20 0048 01/31/20 0056  ALBUMIN 3.7 3.9 3.9 3.9   No results for input(s): LIPASE, AMYLASE in the last 168 hours. No results for input(s): AMMONIA in the last 168 hours. Coagulation profile No results for input(s): INR, PROTIME in the last 168 hours.  CBC: Recent Labs  Lab 01/28/20 0104 01/30/20 0048 02/01/20 0031 02/02/20 0059 02/03/20 0140  WBC 6.7 9.6 11.5* 9.3 9.8  HGB 11.8* 13.1 13.2 13.0 13.5  HCT 37.8* 42.4 42.7 41.8 43.8  MCV 79.6* 80.2 79.7* 79.9* 80.4  PLT 146* 168 210 194 192   Cardiac Enzymes: No results for input(s): CKTOTAL, CKMB, CKMBINDEX, TROPONINI in the last 168 hours. BNP (last 3 results) Recent Labs    02/27/19 1106  PROBNP 37   CBG: No results for input(s): GLUCAP in the last 168 hours. D-Dimer: No results for input(s): DDIMER in the last 72 hours. Hgb A1c: No results for input(s): HGBA1C in the last 72 hours. Lipid Profile: No results for input(s): CHOL, HDL, LDLCALC, TRIG, CHOLHDL, LDLDIRECT in the last 72 hours. Thyroid function studies: No results for input(s): TSH, T4TOTAL, T3FREE, THYROIDAB in the last 72 hours.  Invalid input(s): FREET3 Anemia work up: No results for input(s): VITAMINB12, FOLATE, FERRITIN, TIBC, IRON, RETICCTPCT in the last 72 hours. Sepsis Labs: Recent Labs   Lab 01/30/20 0048 02/01/20 0031 02/02/20 0059 02/03/20 0140  WBC 9.6 11.5* 9.3 9.8    Microbiology Recent Results (from the past 240 hour(s))  Respiratory Panel by RT PCR (Flu A&B, Covid) - Nasopharyngeal Swab     Status: None   Collection Time: 01/27/20 12:16 AM   Specimen: Nasopharyngeal Swab  Result Value Ref Range Status   SARS Coronavirus 2 by RT PCR NEGATIVE NEGATIVE Final    Comment: (NOTE) SARS-CoV-2 target nucleic acids are NOT DETECTED.  The SARS-CoV-2 RNA is generally detectable in upper respiratoy specimens during the acute phase of infection. The lowest concentration of SARS-CoV-2 viral copies this assay can detect is 131 copies/mL. A negative result does not preclude SARS-Cov-2 infection and should not be used as the sole basis for treatment or other patient management decisions. A negative result may occur with  improper specimen collection/handling, submission of specimen other than nasopharyngeal swab, presence of viral mutation(s) within the areas targeted by this assay, and inadequate number of viral copies (<131 copies/mL). A negative result must be combined with clinical observations, patient history, and epidemiological information. The expected result is Negative.  Fact Sheet for Patients:  PinkCheek.be  Fact Sheet for Healthcare Providers:  GravelBags.it  This test is no t yet approved or cleared by the [place] FDA and  has been authorized for detection and/or diagnosis of SARS-CoV-2 by FDA under an Emergency Use Authorization (EUA). This EUA will remain  in effect (meaning this test can be used) for the duration of the COVID-19 declaration under Section 564(b)(1) of the Act, 21 U.S.C. section 360bbb-3(b)(1), unless the authorization is terminated or revoked sooner.     Influenza A by PCR NEGATIVE NEGATIVE Final   Influenza B by PCR NEGATIVE NEGATIVE Final    Comment: (NOTE) The  Xpert Xpress SARS-CoV-2/FLU/RSV assay is intended as an aid in  the diagnosis of influenza from Nasopharyngeal swab specimens and  should not be used as a sole basis for treatment. Nasal washings and  aspirates are unacceptable for Xpert Xpress SARS-CoV-2/FLU/RSV  testing.  Fact Sheet for Patients: PinkCheek.be  Fact Sheet for Healthcare Providers: GravelBags.it  This test is not yet approved or cleared by the [place] FDA and  has been authorized for detection and/or diagnosis of SARS-CoV-2 by  FDA under an Emergency Use Authorization (EUA). This EUA will remain  in effect (meaning this test can be used) for the duration of the  Covid-19 declaration under Section 564(b)(1) of the Act, 21  U.S.C. section 360bbb-3(b)(1), unless the authorization is  terminated or revoked. Performed at [facility], [place] [address] [place], [place] [address]   MRSA PCR Screening     Status: None   Collection Time: 01/27/20  6:46 PM   Specimen: Nasal Mucosa; Nasopharyngeal  Result Value Ref Range Status   MRSA by PCR NEGATIVE NEGATIVE Final    Comment:  The GeneXpert MRSA Assay (FDA approved for NASAL specimens only), is one component of a comprehensive MRSA colonization surveillance program. It is not intended to diagnose MRSA infection nor to guide or monitor treatment for MRSA infections. Performed at [facility], [place] [address] [place], [place] [address]     Procedures and diagnostic studies:  No results found.  Medications:   . apixaban  5 mg Oral BID  . buprenorphine-naloxone  1 tablet Sublingual BID  . furosemide  80 mg Intravenous TID  . gabapentin  800 mg Oral TID  . metolazone  2.5 mg Oral Daily  . metoprolol tartrate  50 mg Oral BID  . pantoprazole  40 mg Oral BID  . potassium chloride  40 mEq Oral TID  . [START ON 02/04/2020] pravastatin  20 mg Oral q1800  . sodium chloride flush  3 mL  Intravenous Q12H  . spironolactone  50 mg Oral Daily   Continuous Infusions: . sodium chloride       LOS: 7 days   Geradine Girt  [facility]   How to contact the [facility] Attending or Consulting provider [place] or covering provider during after hours [place], for this patient?  1. Check the care team in [facility] and look for a) attending/consulting [facility] provider listed and b) the [facility] team listed 2. Log into [email or web address] and use [facility]'s universal password to access. If you do not have the password, please contact the hospital operator. 3. Locate the [place] provider you are looking for under [facility] and page to a number that you can be directly reached. 4. If you still have difficulty reaching the provider, please page the [facility] (Director on Call) for the Hospitalists listed on amion for assistance.  02/03/2020, 2:07 PM

## 2020-02-03 NOTE — Progress Notes (Addendum)
Progress Note  Patient Name: Charles Gray Date of Encounter: 02/03/2020  Primary Cardiologist: Berniece Salines, DO   Subjective   Still reports orthopnea overnight. No chest pain or palpitations. Tolerating Unna boots well but feels like he is retaining fluid in his abdomen. Also reports constipation and is receiving Miralax and Senokot.   Inpatient Medications    Scheduled Meds: . apixaban  5 mg Oral BID  . buprenorphine-naloxone  1 tablet Sublingual BID  . furosemide  80 mg Intravenous TID  . gabapentin  800 mg Oral TID  . metolazone  2.5 mg Oral Daily  . metoprolol tartrate  50 mg Oral BID  . pantoprazole  40 mg Oral BID  . potassium chloride  40 mEq Oral TID  . pravastatin  20 mg Oral TID  . sodium chloride flush  3 mL Intravenous Q12H  . spironolactone  50 mg Oral Daily   Continuous Infusions: . sodium chloride     PRN Meds: sodium chloride, acetaminophen, ondansetron (ZOFRAN) IV, polyethylene glycol, senna-docusate, sodium chloride flush, zolpidem   Vital Signs    Vitals:   02/02/20 2347 02/03/20 0321 02/03/20 0424 02/03/20 0847  BP: 118/82 90/62  122/78  Pulse: 67 67    Resp: 15 14    Temp: 98.8 F (37.1 C) 98.5 F (36.9 C)  98.8 F (37.1 C)  TempSrc: Oral Oral  Oral  SpO2: 92% 94%    Weight:   (!) 164.7 kg   Height:        Intake/Output Summary (Last 24 hours) at 02/03/2020 0935 Last data filed at 02/03/2020 0847 Gross per 24 hour  Intake 1087 ml  Output 2675 ml  Net -1588 ml    Last 3 Weights 02/03/2020 02/02/2020 02/01/2020  Weight (lbs) 363 lb 1.6 oz 364 lb 1.6 oz 365 lb 14.4 oz  Weight (kg) 164.7 kg 165.155 kg 165.971 kg      Telemetry    NSR, HR in 60's to 70's with occasional PAC's.  - Personally Reviewed  ECG    No new tracings.   Physical Exam   General: Obese male appearing in no acute distress. Head: Normocephalic, atraumatic.  Neck: Supple without bruits, JVD difficult to assess secondary to body habitus.  Lungs:  Resp  regular and unlabored, CTA without wheezing or rales. Heart: RRR, S1, S2, no S3, S4, or murmur; no rub. Abdomen: Soft, non-tender. No obvious abdominal masses. Extremities: No clubbing or cyanosis. 1+ pitting edema with Unna boots in place. Distal pedal pulses are 2+ bilaterally. Neuro: Alert and oriented X 3. Moves all extremities spontaneously. Psych: Normal affect.  Labs    Chemistry Recent Labs  Lab 01/29/20 0110 01/29/20 0110 01/30/20 0048 01/30/20 0048 01/31/20 0056 01/31/20 1623 02/01/20 1219 02/02/20 0059 02/03/20 0140  NA 135   < > 134*   < > 136   < > 133* 135 136  K 3.3*   < > 3.3*   < > 2.7*   < > 3.4* 3.1* 3.8  CL 90*   < > 89*   < > 89*   < > 87* 91* 90*  CO2 31   < > 33*   < > 32   < > 32 30 35*  GLUCOSE 113*   < > 135*   < > 126*   < > 175* 126* 144*  BUN 18   < > 20   < > 25*   < > 26* 26* 30*  CREATININE 1.02   < >  1.19   < > 1.16   < > 1.17 1.14 1.22  CALCIUM 9.7   < > 9.7   < > 9.7   < > 9.4 9.5 9.8  ALBUMIN 3.9  --  3.9  --  3.9  --   --   --   --   GFRNONAA >60   < > >60   < > >60   < > >60 >60 >60  ANIONGAP 14   < > 12   < > 15   < > 14 14 11    < > = values in this interval not displayed.     Hematology Recent Labs  Lab 02/01/20 0031 02/02/20 0059 02/03/20 0140  WBC 11.5* 9.3 9.8  RBC 5.36 5.23 5.45  HGB 13.2 13.0 13.5  HCT 42.7 41.8 43.8  MCV 79.7* 79.9* 80.4  MCH 24.6* 24.9* 24.8*  MCHC 30.9 31.1 30.8  RDW 15.9* 15.7* 15.9*  PLT 210 194 192    Cardiac EnzymesNo results for input(s): TROPONINI in the last 168 hours. No results for input(s): TROPIPOC in the last 168 hours.   BNP Recent Labs  Lab 01/27/20 1215  BNP 24.1     DDimer No results for input(s): DDIMER in the last 168 hours.   Radiology    No results found.  Cardiac Studies   Echocardiogram: 07/2019 IMPRESSIONS    1. Suboptimal image quality for diagnostic purposes.  2. Left ventricular ejection fraction, by estimation, is 55 to 60%. The  left ventricle has  normal function. Left ventricular endocardial border  not optimally defined to evaluate regional wall motion. Left ventricular  diastolic parameters are consistent  with Grade I diastolic dysfunction (impaired relaxation).  3. Right ventricular systolic function was not well visualized. The right  ventricular size is not well visualized.  4. The mitral valve is grossly normal. No evidence of mitral valve  regurgitation. No evidence of mitral stenosis.  5. The aortic valve was not well visualized. Aortic valve regurgitation  is not visualized. No aortic stenosis is present.   Patient Profile     64 y.o. male w/ past medical history of chronic diastolic CHF, CAD (s/p prior Coronary CTA in 10/2019 showing mild plaque along RCA and LAD and FFR showing possible distal disease with medical therapy recommended), paroxysmal atrial fibrillation, HTN, HLD, COPD, cirrhosis and history of DVT/PE who is currently admitted for an acute CHF exacerbation.   Assessment & Plan    1. Acute on Chronic Diastolic CHF - Was referred to the hospital from clinic and BNP was normal at 24.1 but likely not accurate given the patient's body habitus.  - He has been receiving IV Lasix 58m TID along with Metolazone 2.5107mdaily with a recorded output of -14.8 L thus far. Weight has declined by 12 lbs (375 lbs to 363 lbs). Previous discharge weight in 08/2019 was 380 lbs and discharge weight of 372 lbs in 12/2019. Creatinine remains stable at 1.22 and K+ is at 3.8. Given orthopnea, would continue with IV Lasix and Metolazone today. Will need to establish a new dry weight as he reports it has been several years since in was in the 360's.   2. Paroxysmal Atrial Fibrillation - New diagnosis for the patient in 01/2020 with prior notes mentioning consideration of EP referral if recurrence. He has been in NSR this admission. Continue Lopresor 5057mID and Eliquis 5mg5mD.   3. HTN - BP has been stable at 90/62 - 122/78 within  the past 24 hours. He remains on Lopressor 63m BID and Spironolactone 562mdaily.   4. CAD - He does have known mild CAD by prior Coronary CT in 10/2019. On Pravastatin 2046maily PTA and unclear why ordered as TID dosing this admission. Will adjust to once daily dosing. Continue BB. Not on ASA given the need for anticoagulation.    5. History of PE - He remains on Eliquis 5mg67mD. Hgb stable at 13.5 by repeat labs today.   6. OSA - Continued compliance with CPAP encouraged.   For questions or updates, please contact [facility] consult [email or web address] for contact info under Cardiology/STEMI.   Signed, BritErma HeritageA-C 9:35 AM 02/03/2020 Pager: [phone] have seen, examined the patient, and reviewed the above assessment and plan.  Changes to above are made where necessary.  On exam, RRR.  Remains volume overloaded.  Continue diuresis.  He will need closely outpatient cardiology follow-up.  Co Sign: JameThompson Grayer 02/03/2020 10:33 AM

## 2020-02-04 DIAGNOSIS — I480 Paroxysmal atrial fibrillation: Secondary | ICD-10-CM | POA: Diagnosis not present

## 2020-02-04 DIAGNOSIS — I5033 Acute on chronic diastolic (congestive) heart failure: Secondary | ICD-10-CM

## 2020-02-04 DIAGNOSIS — I10 Essential (primary) hypertension: Secondary | ICD-10-CM | POA: Diagnosis not present

## 2020-02-04 DIAGNOSIS — I50813 Acute on chronic right heart failure: Secondary | ICD-10-CM | POA: Diagnosis not present

## 2020-02-04 LAB — CBC
HCT: 43.3 % (ref 39.0–52.0)
Hemoglobin: 13.4 g/dL (ref 13.0–17.0)
MCH: 24.6 pg — ABNORMAL LOW (ref 26.0–34.0)
MCHC: 30.9 g/dL (ref 30.0–36.0)
MCV: 79.6 fL — ABNORMAL LOW (ref 80.0–100.0)
Platelets: 192 10*3/uL (ref 150–400)
RBC: 5.44 MIL/uL (ref 4.22–5.81)
RDW: 15.8 % — ABNORMAL HIGH (ref 11.5–15.5)
WBC: 9.5 10*3/uL (ref 4.0–10.5)
nRBC: 0 % (ref 0.0–0.2)

## 2020-02-04 LAB — BASIC METABOLIC PANEL
Anion gap: 14 (ref 5–15)
Anion gap: 15 (ref 5–15)
BUN: 35 mg/dL — ABNORMAL HIGH (ref 8–23)
BUN: 34 mg/dL — ABNORMAL HIGH (ref 8–23)
CO2: 32 mmol/L (ref 22–32)
CO2: 30 mmol/L (ref 22–32)
Calcium: 9.7 mg/dL (ref 8.9–10.3)
Calcium: 9.9 mg/dL (ref 8.9–10.3)
Chloride: 89 mmol/L — ABNORMAL LOW (ref 98–111)
Chloride: 88 mmol/L — ABNORMAL LOW (ref 98–111)
Creatinine, Ser: 1.31 mg/dL — ABNORMAL HIGH (ref 0.61–1.24)
Creatinine, Ser: 1.32 mg/dL — ABNORMAL HIGH (ref 0.61–1.24)
GFR, Estimated: >60 mL/min (ref >60)
GFR, Estimated: >60 mL/min (ref >60)
Glucose, Bld: 137 mg/dL — ABNORMAL HIGH (ref 70–99)
Glucose, Bld: 142 mg/dL — ABNORMAL HIGH (ref 70–99)
Potassium: 2.9 mmol/L — ABNORMAL LOW (ref 3.5–5.1)
Potassium: 3.1 mmol/L — ABNORMAL LOW (ref 3.5–5.1)
Sodium: 135 mmol/L (ref 135–145)
Sodium: 133 mmol/L — ABNORMAL LOW (ref 135–145)

## 2020-02-04 LAB — MAGNESIUM: Magnesium: 2.2 mg/dL (ref 1.7–2.4)

## 2020-02-04 MED ORDER — POTASSIUM CHLORIDE 10 MEQ/100ML IV SOLN
10.0000 meq | INTRAVENOUS | Status: AC
  Administered 2020-02-04 (×3): 10 meq via INTRAVENOUS
  Filled 2020-02-04 (×3): qty 100

## 2020-02-04 MED ORDER — POTASSIUM CHLORIDE 10 MEQ/100ML IV SOLN
10.0000 meq | INTRAVENOUS | Status: DC
  Administered 2020-02-04: 10 meq via INTRAVENOUS
  Filled 2020-02-04: qty 100

## 2020-02-04 NOTE — Progress Notes (Signed)
Progress Note    Charles Gray  WLN:[id number] DOB: 12/02/1955  DOA: 01/26/2020 PCP: Enid Skeens., MD    Brief Narrative:     Medical records reviewed and are as summarized below:  Charles Gray is an 64 y.o. male with history of paroxysmal A. fib on Eliquis, probable chronic right heart failure, hypertension, possible cirrhosis, OSA and PE/DVT in 05/2019 direct up to ED by his cardiologist for refractory bilateral lower extremity edema and weight gain.  Started on IV Lasix and admitted for fluid overload/bilateral lower extremity edema and concern for acute on chronic right heart failure.  cardiology consulted, and recommended IV diuresis.  Assessment/Plan:   Principal Problem:   Acute on chronic right-sided heart failure (HCC) Active Problems:   Benign essential HTN   OSA (obstructive sleep apnea)   Atrial fibrillation (HCC)   Acute on chronic right heart failure (HCC)   Acute on chronic diastolic CHF  -diuretics per cardiology -ABIs ok-- UNNA boot placed -Leg elevation, fluid and sodium restriction -daily weights/strict I/Os -down 16.5L thus far -suspect at home fluid restriction will be an issue  Paroxysmal atrial fibrillation:  -Continue Eliquis for anticoagulation. -Started on metoprolol  OSA on CPAP -Refusing CPAP. -not compliant at home either  History of DVT/PE -Continue Eliquis  Essential hypertension: Normotensive -Now on metoprolol and diuretics as above  Chronic pain syndrome: Stable -Continue home Suboxone and gabapentin  Thrombocytopenia:  -Resolved. -Continue monitoring  Hypokalemia:  -replete aggressively  -recheck at 12 pm- replace if needed  Morbid obesity Body mass index is 35.68 kg/m.   Family Communication/Anticipated D/C date and plan/Code Status   DVT prophylaxis: eliquis Code Status: Full Code.  Disposition Plan: Status is: Inpatient  Remains inpatient appropriate because:IV treatments appropriate  due to intensity of illness or inability to take PO   Dispo: The patient is from: Home              Anticipated d/c is to: Home              Anticipated d/c date is: when ok with cardiology              Patient currently is not medically stable to d/c.         Medical Consultants:    cards  Subjective:   Nursing reports asked for liquids all night  Objective:    Vitals:   02/04/20 0320 02/04/20 0448 02/04/20 0826 02/04/20 1050  BP: 108/84  123/85 111/65  Pulse: 63  64 71  Resp: 14  19 16   Temp: 98.3 F (36.8 C)  98.5 F (36.9 C) 98.6 F (37 C)  TempSrc: Oral  Oral Oral  SpO2: 98%  94% 97%  Weight:  (!) 154.8 kg    Height:        Intake/Output Summary (Last 24 hours) at 02/04/2020 1202 Last data filed at 02/04/2020 1101 Gross per 24 hour  Intake 1884.57 ml  Output 2600 ml  Net -715.43 ml   Filed Weights   02/02/20 0328 02/03/20 0424 02/04/20 0448  Weight: (!) 165.2 kg (!) 164.7 kg (!) 154.8 kg    Exam:   General: Appearance:    Obese male in no acute distress     Lungs:     diminished, respirations unlabored  Heart:    Normal heart rate.  No murmurs, rubs, or gallops.   MS:   All extremities are intact. UNNA boots in place  Neurologic:   Awake, alert, oriented x  3. No apparent focal neurological           defect.     Data Reviewed:   I have personally reviewed following labs and imaging studies:  Labs: Labs show the following:   Basic Metabolic Panel: Recent Labs  Lab 01/29/20 0110 01/29/20 0110 01/30/20 0048 01/30/20 0048 01/31/20 0056 01/31/20 1623 02/01/20 0031 02/01/20 0031 02/01/20 1219 02/01/20 1219 02/02/20 0059 02/02/20 0059 02/03/20 0140 02/04/20 0142  NA 135   < > 134*   < > 136   < > 134*  --  133*  --  135  --  136 135  K 3.3*   < > 3.3*   < > 2.7*   < > 2.7*   < > 3.4*   < > 3.1*   < > 3.8 2.9*  CL 90*   < > 89*   < > 89*   < > 87*  --  87*  --  91*  --  90* 89*  CO2 31   < > 33*   < > 32   < > 32  --  32  --  30   --  35* 32  GLUCOSE 113*   < > 135*   < > 126*   < > 145*  --  175*  --  126*  --  144* 137*  BUN 18   < > 20   < > 25*   < > 27*  --  26*  --  26*  --  30* 35*  CREATININE 1.02   < > 1.19   < > 1.16   < > 1.24  --  1.17  --  1.14  --  1.22 1.31*  CALCIUM 9.7   < > 9.7   < > 9.7   < > 9.4  --  9.4  --  9.5  --  9.8 9.7  MG 2.3  --  2.2  --  2.2  --   --   --   --   --   --   --   --   --   PHOS 3.6  --  3.7  --  4.0  --   --   --   --   --   --   --   --   --    < > = values in this interval not displayed.   GFR Estimated Creatinine Clearance: 98.5 mL/min (A) (by C-G formula based on SCr of 1.31 mg/dL (H)). Liver Function Tests: Recent Labs  Lab 01/29/20 0110 01/30/20 0048 01/31/20 0056  ALBUMIN 3.9 3.9 3.9   No results for input(s): LIPASE, AMYLASE in the last 168 hours. No results for input(s): AMMONIA in the last 168 hours. Coagulation profile No results for input(s): INR, PROTIME in the last 168 hours.  CBC: Recent Labs  Lab 01/30/20 0048 02/01/20 0031 02/02/20 0059 02/03/20 0140 02/04/20 0142  WBC 9.6 11.5* 9.3 9.8 9.5  HGB 13.1 13.2 13.0 13.5 13.4  HCT 42.4 42.7 41.8 43.8 43.3  MCV 80.2 79.7* 79.9* 80.4 79.6*  PLT 168 210 194 192 192   Cardiac Enzymes: No results for input(s): CKTOTAL, CKMB, CKMBINDEX, TROPONINI in the last 168 hours. BNP (last 3 results) Recent Labs    02/27/19 1106  PROBNP 37   CBG: No results for input(s): GLUCAP in the last 168 hours. D-Dimer: No results for input(s): DDIMER in the last 72 hours. Hgb A1c: No results for input(s):  HGBA1C in the last 72 hours. Lipid Profile: No results for input(s): CHOL, HDL, LDLCALC, TRIG, CHOLHDL, LDLDIRECT in the last 72 hours. Thyroid function studies: No results for input(s): TSH, T4TOTAL, T3FREE, THYROIDAB in the last 72 hours.  Invalid input(s): FREET3 Anemia work up: No results for input(s): VITAMINB12, FOLATE, FERRITIN, TIBC, IRON, RETICCTPCT in the last 72 hours. Sepsis Labs: Recent Labs   Lab 02/01/20 0031 02/02/20 0059 02/03/20 0140 02/04/20 0142  WBC 11.5* 9.3 9.8 9.5    Microbiology Recent Results (from the past 240 hour(s))  Respiratory Panel by RT PCR (Flu A&B, Covid) - Nasopharyngeal Swab     Status: None   Collection Time: 01/27/20 12:16 AM   Specimen: Nasopharyngeal Swab  Result Value Ref Range Status   SARS Coronavirus 2 by RT PCR NEGATIVE NEGATIVE Final    Comment: (NOTE) SARS-CoV-2 target nucleic acids are NOT DETECTED.  The SARS-CoV-2 RNA is generally detectable in upper respiratoy specimens during the acute phase of infection. The lowest concentration of SARS-CoV-2 viral copies this assay can detect is 131 copies/mL. A negative result does not preclude SARS-Cov-2 infection and should not be used as the sole basis for treatment or other patient management decisions. A negative result may occur with  improper specimen collection/handling, submission of specimen other than nasopharyngeal swab, presence of viral mutation(s) within the areas targeted by this assay, and inadequate number of viral copies (<131 copies/mL). A negative result must be combined with clinical observations, patient history, and epidemiological information. The expected result is Negative.  Fact Sheet for Patients:  PinkCheek.be  Fact Sheet for Healthcare Providers:  GravelBags.it  This test is no t yet approved or cleared by the [place] FDA and  has been authorized for detection and/or diagnosis of SARS-CoV-2 by FDA under an Emergency Use Authorization (EUA). This EUA will remain  in effect (meaning this test can be used) for the duration of the COVID-19 declaration under Section 564(b)(1) of the Act, 21 U.S.C. section 360bbb-3(b)(1), unless the authorization is terminated or revoked sooner.     Influenza A by PCR NEGATIVE NEGATIVE Final   Influenza B by PCR NEGATIVE NEGATIVE Final    Comment: (NOTE) The  Xpert Xpress SARS-CoV-2/FLU/RSV assay is intended as an aid in  the diagnosis of influenza from Nasopharyngeal swab specimens and  should not be used as a sole basis for treatment. Nasal washings and  aspirates are unacceptable for Xpert Xpress SARS-CoV-2/FLU/RSV  testing.  Fact Sheet for Patients: PinkCheek.be  Fact Sheet for Healthcare Providers: GravelBags.it  This test is not yet approved or cleared by the [place] FDA and  has been authorized for detection and/or diagnosis of SARS-CoV-2 by  FDA under an Emergency Use Authorization (EUA). This EUA will remain  in effect (meaning this test can be used) for the duration of the  Covid-19 declaration under Section 564(b)(1) of the Act, 21  U.S.C. section 360bbb-3(b)(1), unless the authorization is  terminated or revoked. Performed at [facility], [place] [address], [place], [place] [address]   MRSA PCR Screening     Status: None   Collection Time: 01/27/20  6:46 PM   Specimen: Nasal Mucosa; Nasopharyngeal  Result Value Ref Range Status   MRSA by PCR NEGATIVE NEGATIVE Final    Comment:        The GeneXpert MRSA Assay (FDA approved for NASAL specimens only), is one component of a comprehensive MRSA colonization surveillance program. It is not intended to diagnose MRSA infection nor to  guide or monitor treatment for MRSA infections. Performed at [facility], [place] [address] [place], [place] [address]     Procedures and diagnostic studies:  No results found.  Medications:   . apixaban  5 mg Oral BID  . buprenorphine-naloxone  1 tablet Sublingual BID  . furosemide  80 mg Intravenous TID  . gabapentin  800 mg Oral TID  . metolazone  2.5 mg Oral Daily  . metoprolol tartrate  50 mg Oral BID  . pantoprazole  40 mg Oral BID  . potassium chloride  40 mEq Oral TID  . pravastatin  20 mg Oral q1800  . sodium chloride flush  3 mL Intravenous Q12H  .  spironolactone  50 mg Oral Daily   Continuous Infusions: . sodium chloride 50 mL/hr at 02/04/20 0600  . potassium chloride 10 mEq (02/04/20 1101)     LOS: 8 days   Geradine Girt  [facility]   How to contact the [facility] Attending or Consulting provider [place] or covering provider during after hours [place], for this patient?  1. Check the care team in [facility] and look for a) attending/consulting TR[facility] provider listed and b) the [facility] team listed 2. Log into [email or web address] and use [place]'s universal password to access. If you do not have the password, please contact the hospital operator. 3. Locate the [facility] provider you are looking for under [facility] and page to a number that you can be directly reached. 4. If you still have difficulty reaching the provider, please page the [place] (Director on Call) for the Hospitalists listed on amion for assistance.  02/04/2020, 12:02 PM

## 2020-02-04 NOTE — Progress Notes (Signed)
Progress Note   Subjective   Doing well today, the patient denies CP or SOB.  + muscle cramping overnight  Inpatient Medications    Scheduled Meds: . apixaban  5 mg Oral BID  . buprenorphine-naloxone  1 tablet Sublingual BID  . furosemide  80 mg Intravenous TID  . gabapentin  800 mg Oral TID  . metolazone  2.5 mg Oral Daily  . metoprolol tartrate  50 mg Oral BID  . pantoprazole  40 mg Oral BID  . potassium chloride  40 mEq Oral TID  . pravastatin  20 mg Oral q1800  . sodium chloride flush  3 mL Intravenous Q12H  . spironolactone  50 mg Oral Daily   Continuous Infusions: . sodium chloride 50 mL/hr at 02/04/20 0600   PRN Meds: sodium chloride, acetaminophen, ondansetron (ZOFRAN) IV, polyethylene glycol, senna-docusate, sodium chloride flush, zolpidem   Vital Signs    Vitals:   02/03/20 2300 02/04/20 0320 02/04/20 0448 02/04/20 0826  BP: 123/76 108/84  123/85  Pulse: 65 63  64  Resp: 14 14  19   Temp: 97.8 F (36.6 C) 98.3 F (36.8 C)  98.5 F (36.9 C)  TempSrc: Oral Oral  Oral  SpO2: 98% 98%  94%  Weight:   (!) 154.8 kg   Height:        Intake/Output Summary (Last 24 hours) at 02/04/2020 0917 Last data filed at 02/04/2020 5170 Gross per 24 hour  Intake 1881.57 ml  Output 3800 ml  Net -1918.43 ml   Filed Weights   02/02/20 0328 02/03/20 0424 02/04/20 0448  Weight: (!) 165.2 kg (!) 164.7 kg (!) 154.8 kg    Telemetry    sinus - Personally Reviewed  Physical Exam   GEN- The patient is overweight appearing, alert and oriented x 3 today.  Ambulatory in the room Head- normocephalic, atraumatic Eyes-  Sclera clear, conjunctiva pink Ears- hearing intact Oropharynx- clear Neck- supple, Lungs-  normal work of breathing Heart- Regular rate and rhythm  GI- soft  Extremities- no clubbing, cyanosis,+ edema with unna boots in place MS- no significant deformity or atrophy Skin- no rash or lesion Psych- euthymic mood, full affect Neuro- strength and  sensation are intact   Labs    Chemistry Recent Labs  Lab 01/29/20 0110 01/29/20 0110 01/30/20 0048 01/30/20 0048 01/31/20 0056 01/31/20 1623 02/02/20 0059 02/03/20 0140 02/04/20 0142  NA 135   < > 134*   < > 136   < > 135 136 135  K 3.3*   < > 3.3*   < > 2.7*   < > 3.1* 3.8 2.9*  CL 90*   < > 89*   < > 89*   < > 91* 90* 89*  CO2 31   < > 33*   < > 32   < > 30 35* 32  GLUCOSE 113*   < > 135*   < > 126*   < > 126* 144* 137*  BUN 18   < > 20   < > 25*   < > 26* 30* 35*  CREATININE 1.02   < > 1.19   < > 1.16   < > 1.14 1.22 1.31*  CALCIUM 9.7   < > 9.7   < > 9.7   < > 9.5 9.8 9.7  ALBUMIN 3.9  --  3.9  --  3.9  --   --   --   --   GFRNONAA >60   < > >60   < > >  60   < > >60 >60 >60  ANIONGAP 14   < > 12   < > 15   < > 14 11 14    < > = values in this interval not displayed.     Hematology Recent Labs  Lab 02/02/20 0059 02/03/20 0140 02/04/20 0142  WBC 9.3 9.8 9.5  RBC 5.23 5.45 5.44  HGB 13.0 13.5 13.4  HCT 41.8 43.8 43.3  MCV 79.9* 80.4 79.6*  MCH 24.9* 24.8* 24.6*  MCHC 31.1 30.8 30.9  RDW 15.7* 15.9* 15.8*  PLT 194 192 192     Patient ID  64 y.o. male w/ past medical history of chronic diastolic CHF, CAD (s/p prior Coronary CTA in 10/2019 showing mild plaque along RCA and LAD and FFR showing possible distal disease with medical therapy recommended), paroxysmal atrial fibrillation, HTN, HLD, COPD, cirrhosis and history of DVT/PE who is currently admitted for an acute CHF exacerbation.   Assessment & Plan    1.  Acute on chronic diastolic dysfunction Improving slowly with IV diuresis Continue current therapy Strict Is and Os Daily weights  2 paroxysmal atrial fibrillation Maintaining sinus rhythm Continue eliquis for stroke prevention  3. HTN Stable No change required today  4. H/o PTE Continue eliquis  5. OSA Compliance with CPAP is advised  6. Hypokalemia Requiring large doses of K to replace Will given additional IV K today  Thompson Grayer MD,  [facility] 02/04/2020 9:17 AM

## 2020-02-05 DIAGNOSIS — I50813 Acute on chronic right heart failure: Secondary | ICD-10-CM | POA: Diagnosis not present

## 2020-02-05 DIAGNOSIS — G4733 Obstructive sleep apnea (adult) (pediatric): Secondary | ICD-10-CM | POA: Diagnosis not present

## 2020-02-05 DIAGNOSIS — I10 Essential (primary) hypertension: Secondary | ICD-10-CM | POA: Diagnosis not present

## 2020-02-05 DIAGNOSIS — I480 Paroxysmal atrial fibrillation: Secondary | ICD-10-CM | POA: Diagnosis not present

## 2020-02-05 LAB — BASIC METABOLIC PANEL
Anion gap: 13 (ref 5–15)
Anion gap: 13 (ref 5–15)
BUN: 36 mg/dL — ABNORMAL HIGH (ref 8–23)
BUN: 35 mg/dL — ABNORMAL HIGH (ref 8–23)
CO2: 34 mmol/L — ABNORMAL HIGH (ref 22–32)
CO2: 31 mmol/L (ref 22–32)
Calcium: 9.6 mg/dL (ref 8.9–10.3)
Calcium: 9.7 mg/dL (ref 8.9–10.3)
Chloride: 88 mmol/L — ABNORMAL LOW (ref 98–111)
Chloride: 89 mmol/L — ABNORMAL LOW (ref 98–111)
Creatinine, Ser: 1.29 mg/dL — ABNORMAL HIGH (ref 0.61–1.24)
Creatinine, Ser: 1.36 mg/dL — ABNORMAL HIGH (ref 0.61–1.24)
GFR, Estimated: >60 mL/min (ref >60)
GFR, Estimated: 58 mL/min — ABNORMAL LOW (ref >60)
Glucose, Bld: 121 mg/dL — ABNORMAL HIGH (ref 70–99)
Glucose, Bld: 134 mg/dL — ABNORMAL HIGH (ref 70–99)
Potassium: 2.4 mmol/L — CL (ref 3.5–5.1)
Potassium: 2.8 mmol/L — ABNORMAL LOW (ref 3.5–5.1)
Sodium: 135 mmol/L (ref 135–145)
Sodium: 133 mmol/L — ABNORMAL LOW (ref 135–145)

## 2020-02-05 LAB — CBC
HCT: 42.1 % (ref 39.0–52.0)
Hemoglobin: 13 g/dL (ref 13.0–17.0)
MCH: 24.3 pg — ABNORMAL LOW (ref 26.0–34.0)
MCHC: 30.9 g/dL (ref 30.0–36.0)
MCV: 78.8 fL — ABNORMAL LOW (ref 80.0–100.0)
Platelets: 161 10*3/uL (ref 150–400)
RBC: 5.34 MIL/uL (ref 4.22–5.81)
RDW: 15.6 % — ABNORMAL HIGH (ref 11.5–15.5)
WBC: 8.9 10*3/uL (ref 4.0–10.5)
nRBC: 0 % (ref 0.0–0.2)

## 2020-02-05 MED ORDER — SPIRONOLACTONE 25 MG PO TABS
100.0000 mg | ORAL_TABLET | Freq: Every day | ORAL | Status: DC
  Administered 2020-02-06 – 2020-02-09 (×4): 100 mg via ORAL
  Filled 2020-02-05 (×4): qty 4

## 2020-02-05 MED ORDER — METOLAZONE 2.5 MG PO TABS
2.5000 mg | ORAL_TABLET | ORAL | Status: DC
  Administered 2020-02-07: 2.5 mg via ORAL
  Filled 2020-02-05 (×2): qty 1

## 2020-02-05 MED ORDER — POTASSIUM CHLORIDE CRYS ER 20 MEQ PO TBCR
60.0000 meq | EXTENDED_RELEASE_TABLET | Freq: Three times a day (TID) | ORAL | Status: DC
  Administered 2020-02-05 – 2020-02-06 (×4): 60 meq via ORAL
  Filled 2020-02-05 (×4): qty 3

## 2020-02-05 MED ORDER — POTASSIUM CHLORIDE CRYS ER 20 MEQ PO TBCR
40.0000 meq | EXTENDED_RELEASE_TABLET | Freq: Once | ORAL | Status: AC
  Administered 2020-02-05: 40 meq via ORAL
  Filled 2020-02-05: qty 2

## 2020-02-05 MED ORDER — POTASSIUM CHLORIDE 10 MEQ/100ML IV SOLN
10.0000 meq | INTRAVENOUS | Status: AC
  Administered 2020-02-05 (×5): 10 meq via INTRAVENOUS
  Filled 2020-02-05 (×5): qty 100

## 2020-02-05 MED ORDER — POTASSIUM CHLORIDE CRYS ER 20 MEQ PO TBCR
40.0000 meq | EXTENDED_RELEASE_TABLET | Freq: Three times a day (TID) | ORAL | Status: DC
  Administered 2020-02-05 (×2): 40 meq via ORAL
  Filled 2020-02-05 (×2): qty 2

## 2020-02-05 NOTE — Progress Notes (Addendum)
CRITICAL VALUE ALERT  Critical Value:  K 2.4  Date & Time Notied:  02/05/20 at 0400  Provider Notified: Dr. Myna Hidalgo  Orders Received/Actions taken: 5 runs K IV and 42mq K PO TID. [facility]

## 2020-02-05 NOTE — Progress Notes (Signed)
Physical Therapy Treatment Patient Details Name: Charles Gray MRN: [id number] DOB: 27-Aug-1955 Today's Date: 02/05/2020    History of Present Illness Pt adm with acute on chronic rt heart failure. PMH - chf, htn, osa, PE/DVT, afib, obesity, chronic pain,     PT Comments    Pt has not been able to progress his ambulation due to increased fatigue, back pain, and deconditioning. Pt is min guard for transfers and ambulation of 150 feet with RW. Pt reports 27 steps to get into his 2nd story apartment. Given current level of strength and endurance, getting in and out of his house will be increasingly difficult. PT changing recommendations for HHPT at discharge. PT will continue to follow acutely.   Follow Up Recommendations  Home health PT     Equipment Recommendations  Other (comment) (Rollator)       Precautions / Restrictions Precautions Precautions: Fall Restrictions Weight Bearing Restrictions: No    Mobility  Bed Mobility               General bed mobility comments: Pt up in chair  Transfers Overall transfer level: Needs assistance Equipment used: Rolling walker (2 wheeled) Transfers: Sit to/from Stand Sit to Stand: Min guard         General transfer comment: Incr time and effort from low chair  Ambulation/Gait Ambulation/Gait assistance: Min guard Gait Distance (Feet): 150 Feet Assistive device: Rolling walker (2 wheeled) Gait Pattern/deviations: Step-through pattern;Decreased stride length;Trunk flexed;Wide base of support Gait velocity: decr Gait velocity interpretation: <1.31 ft/sec, indicative of household ambulator General Gait Details: is unable to progress mobility today due to increased fatigue        Balance Overall balance assessment: Needs assistance Sitting-balance support: No upper extremity supported;Feet supported Sitting balance-Leahy Scale: Good     Standing balance support: No upper extremity supported;During functional  activity Standing balance-Leahy Scale: Fair Standing balance comment: statically                            Cognition Arousal/Alertness: Awake/alert Behavior During Therapy: WFL for tasks assessed/performed Overall Cognitive Status: Within Functional Limits for tasks assessed                                        Exercises General Exercises - Lower Extremity Ankle Circles/Pumps: AROM;Both;10 reps Quad Sets: AROM;Both;10 reps Long Arc Quad: AROM;Both;10 reps Hip Flexion/Marching: AROM;Both;10 reps    General Comments General comments (skin integrity, edema, etc.): HR 70-85 bpm with ambulation       Pertinent Vitals/Pain Pain Assessment: 0-10 Pain Score: 8  Pain Location: back Pain Descriptors / Indicators: Grimacing;Tightness;Aching;Sore           PT Goals (current goals can now be found in the care plan section) Acute Rehab PT Goals Patient Stated Goal: return home PT Goal Formulation: With patient Time For Goal Achievement: 02/11/20 Potential to Achieve Goals: Good Progress towards PT goals: Not progressing toward goals - comment (limited by pain and fatigue)    Frequency    Min 3X/week      PT Plan Discharge plan needs to be updated    Co-evaluation              AM-PAC PT "6 Clicks" Mobility   Outcome Measure  Help needed turning from your back to your side while in a flat bed without using bedrails?:  None Help needed moving from lying on your back to sitting on the side of a flat bed without using bedrails?: None Help needed moving to and from a bed to a chair (including a wheelchair)?: A Little Help needed standing up from a chair using your arms (e.g., wheelchair or bedside chair)?: A Little Help needed to walk in hospital room?: A Little Help needed climbing 3-5 steps with a railing? : A Little 6 Click Score: 20    End of Session Equipment Utilized During Treatment: Gait belt Activity Tolerance: Patient limited  by fatigue Patient left: in chair;with call bell/phone within reach Nurse Communication: Mobility status PT Visit Diagnosis: Other abnormalities of gait and mobility (R26.89);Muscle weakness (generalized) (M62.81)     Time: 5456-2563 PT Time Calculation (min) (ACUTE ONLY): 52 min  Charges:  $Gait Training: 8-22 mins $Therapeutic Exercise: 8-22 mins $Therapeutic Activity: 8-22 mins                     Charles Gray PT, DPT Acute Rehabilitation Services Pager [phone] Office [phone]    [name] 02/05/2020, 10:46 AM

## 2020-02-05 NOTE — Progress Notes (Signed)
Progress Note    Charles Gray  QJJ:[id number] DOB: Jan 13, 1956  DOA: 01/26/2020 PCP: Enid Skeens., MD    Brief Narrative:     Medical records reviewed and are as summarized below:  Charles Gray is an 64 y.o. male with history of paroxysmal A. fib on Eliquis, probable chronic right heart failure, hypertension, possible cirrhosis, OSA and PE/DVT in 05/2019 direct up to ED by his cardiologist for refractory bilateral lower extremity edema and weight gain.  Started on IV Lasix and admitted for fluid overload/bilateral lower extremity edema and concern for acute on chronic right heart failure.  cardiology consulted, and recommended IV diuresis.  Patient has been diuresing well complicated by hypotension.  Assessment/Plan:   Principal Problem:   Acute on chronic right-sided heart failure (HCC) Active Problems:   Benign essential HTN   OSA (obstructive sleep apnea)   Atrial fibrillation (HCC)   Acute on chronic right heart failure (HCC)   Acute on chronic diastolic CHF  -diuretics per cardiology -ABIs ok-- UNNA boot placed -Leg elevation, fluid and sodium restriction -daily weights/strict I/Os -down 19L thus far -suspect at home fluid restriction will be an issue  Paroxysmal atrial fibrillation:  -Continue Eliquis for anticoagulation. -Started on metoprolol  OSA on CPAP -Refusing CPAP. -not compliant at home either  History of DVT/PE -Continue Eliquis  Essential hypertension: Normotensive -Now on metoprolol and diuretics as above  Chronic pain syndrome: Stable -Continue home Suboxone and gabapentin  Thrombocytopenia:  -Resolved. -Continue monitoring  Hypokalemia:  -replete aggressively  -recheck at 4 pm- replace if needed  Morbid obesity Body mass index is 37.87 kg/m.   Family Communication/Anticipated D/C date and plan/Code Status   DVT prophylaxis: eliquis Code Status: Full Code.  Disposition Plan: Status is: Inpatient  Remains  inpatient appropriate because:IV treatments appropriate due to intensity of illness or inability to take PO   Dispo: The patient is from: Home              Anticipated d/c is to: Home              Anticipated d/c date is: when ok with cardiology              Patient currently is not medically stable to d/c.         Medical Consultants:    cards  Subjective:   K is low again today  Objective:    Vitals:   02/05/20 0315 02/05/20 0428 02/05/20 0743 02/05/20 1052  BP: 115/76  112/74 122/83  Pulse: 74  72 68  Resp: 17  13 10   Temp: 97.6 F (36.4 C)  98.6 F (37 C) (!) 97.5 F (36.4 C)  TempSrc: Oral  Oral Oral  SpO2: 93%  96% 96%  Weight:  (!) 164.3 kg    Height:        Intake/Output Summary (Last 24 hours) at 02/05/2020 1423 Last data filed at 02/05/2020 1154 Gross per 24 hour  Intake 1670.11 ml  Output 4350 ml  Net -2679.89 ml   Filed Weights   02/03/20 0424 02/04/20 0448 02/05/20 0428  Weight: (!) 164.7 kg (!) 154.8 kg (!) 164.3 kg    Exam:   General: Appearance:    Obese male in no acute distress     Lungs:      respirations unlabored  Heart:    Normal heart rate.            Data Reviewed:   I have personally reviewed  following labs and imaging studies:  Labs: Labs show the following:   Basic Metabolic Panel: Recent Labs  Lab 01/30/20 0048 01/30/20 0048 01/31/20 0056 01/31/20 1623 02/02/20 0059 02/02/20 0059 02/03/20 0140 02/03/20 0140 02/04/20 0142 02/04/20 0142 02/04/20 1221 02/05/20 0301  NA 134*   < > 136   < > 135  --  136  --  135  --  133* 135  K 3.3*   < > 2.7*   < > 3.1*   < > 3.8   < > 2.9*   < > 3.1* 2.4*  CL 89*   < > 89*   < > 91*  --  90*  --  89*  --  88* 88*  CO2 33*   < > 32   < > 30  --  35*  --  32  --  30 34*  GLUCOSE 135*   < > 126*   < > 126*  --  144*  --  137*  --  142* 121*  BUN 20   < > 25*   < > 26*  --  30*  --  35*  --  34* 36*  CREATININE 1.19   < > 1.16   < > 1.14  --  1.22  --  1.31*  --  1.32*  1.29*  CALCIUM 9.7   < > 9.7   < > 9.5  --  9.8  --  9.7  --  9.9 9.6  MG 2.2  --  2.2  --   --   --   --   --   --   --  2.2  --   PHOS 3.7  --  4.0  --   --   --   --   --   --   --   --   --    < > = values in this interval not displayed.   GFR Estimated Creatinine Clearance: 103.2 mL/min (A) (by C-G formula based on SCr of 1.29 mg/dL (H)). Liver Function Tests: Recent Labs  Lab 01/30/20 0048 01/31/20 0056  ALBUMIN 3.9 3.9   No results for input(s): LIPASE, AMYLASE in the last 168 hours. No results for input(s): AMMONIA in the last 168 hours. Coagulation profile No results for input(s): INR, PROTIME in the last 168 hours.  CBC: Recent Labs  Lab 02/01/20 0031 02/02/20 0059 02/03/20 0140 02/04/20 0142 02/05/20 0301  WBC 11.5* 9.3 9.8 9.5 8.9  HGB 13.2 13.0 13.5 13.4 13.0  HCT 42.7 41.8 43.8 43.3 42.1  MCV 79.7* 79.9* 80.4 79.6* 78.8*  PLT 210 194 192 192 161   Cardiac Enzymes: No results for input(s): CKTOTAL, CKMB, CKMBINDEX, TROPONINI in the last 168 hours. BNP (last 3 results) Recent Labs    02/27/19 1106  PROBNP 37   CBG: No results for input(s): GLUCAP in the last 168 hours. D-Dimer: No results for input(s): DDIMER in the last 72 hours. Hgb A1c: No results for input(s): HGBA1C in the last 72 hours. Lipid Profile: No results for input(s): CHOL, HDL, LDLCALC, TRIG, CHOLHDL, LDLDIRECT in the last 72 hours. Thyroid function studies: No results for input(s): TSH, T4TOTAL, T3FREE, THYROIDAB in the last 72 hours.  Invalid input(s): FREET3 Anemia work up: No results for input(s): VITAMINB12, FOLATE, FERRITIN, TIBC, IRON, RETICCTPCT in the last 72 hours. Sepsis Labs: Recent Labs  Lab 02/02/20 0059 02/03/20 0140 02/04/20 0142 02/05/20 0301  WBC 9.3 9.8 9.5 8.9    Microbiology  Recent Results (from the past 240 hour(s))  Respiratory Panel by RT PCR (Flu A&B, Covid) - Nasopharyngeal Swab     Status: None   Collection Time: 01/27/20 12:16 AM   Specimen:  Nasopharyngeal Swab  Result Value Ref Range Status   SARS Coronavirus 2 by RT PCR NEGATIVE NEGATIVE Final    Comment: (NOTE) SARS-CoV-2 target nucleic acids are NOT DETECTED.  The SARS-CoV-2 RNA is generally detectable in upper respiratoy specimens during the acute phase of infection. The lowest concentration of SARS-CoV-2 viral copies this assay can detect is 131 copies/mL. A negative result does not preclude SARS-Cov-2 infection and should not be used as the sole basis for treatment or other patient management decisions. A negative result may occur with  improper specimen collection/handling, submission of specimen other than nasopharyngeal swab, presence of viral mutation(s) within the areas targeted by this assay, and inadequate number of viral copies (<131 copies/mL). A negative result must be combined with clinical observations, patient history, and epidemiological information. The expected result is Negative.  Fact Sheet for Patients:  PinkCheek.be  Fact Sheet for Healthcare Providers:  GravelBags.it  This test is no t yet approved or cleared by the [place] FDA and  has been authorized for detection and/or diagnosis of SARS-CoV-2 by FDA under an Emergency Use Authorization (EUA). This EUA will remain  in effect (meaning this test can be used) for the duration of the COVID-19 declaration under Section 564(b)(1) of the Act, 21 U.S.C. section 360bbb-3(b)(1), unless the authorization is terminated or revoked sooner.     Influenza A by PCR NEGATIVE NEGATIVE Final   Influenza B by PCR NEGATIVE NEGATIVE Final    Comment: (NOTE) The Xpert Xpress SARS-CoV-2/FLU/RSV assay is intended as an aid in  the diagnosis of influenza from Nasopharyngeal swab specimens and  should not be used as a sole basis for treatment. Nasal washings and  aspirates are unacceptable for Xpert Xpress SARS-CoV-2/FLU/RSV  testing.  Fact Sheet  for Patients: PinkCheek.be  Fact Sheet for Healthcare Providers: GravelBags.it  This test is not yet approved or cleared by the [place] FDA and  has been authorized for detection and/or diagnosis of SARS-CoV-2 by  FDA under an Emergency Use Authorization (EUA). This EUA will remain  in effect (meaning this test can be used) for the duration of the  Covid-19 declaration under Section 564(b)(1) of the Act, 21  U.S.C. section 360bbb-3(b)(1), unless the authorization is  terminated or revoked. Performed at [facility], [place] [address] [place], [place] [address]   MRSA PCR Screening     Status: None   Collection Time: 01/27/20  6:46 PM   Specimen: Nasal Mucosa; Nasopharyngeal  Result Value Ref Range Status   MRSA by PCR NEGATIVE NEGATIVE Final    Comment:        The GeneXpert MRSA Assay (FDA approved for NASAL specimens only), is one component of a comprehensive MRSA colonization surveillance program. It is not intended to diagnose MRSA infection nor to guide or monitor treatment for MRSA infections. Performed at [facility], [place] [address], [place], [place] [address]     Procedures and diagnostic studies:  No results found.  Medications:   . apixaban  5 mg Oral BID  . buprenorphine-naloxone  1 tablet Sublingual BID  . furosemide  80 mg Intravenous TID  . gabapentin  800 mg Oral TID  . [START ON 02/06/2020] metolazone  2.5 mg Oral Q M,W,F  . metoprolol tartrate  50  mg Oral BID  . pantoprazole  40 mg Oral BID  . potassium chloride  40 mEq Oral TID  . pravastatin  20 mg Oral q1800  . sodium chloride flush  3 mL Intravenous Q12H  . [START ON 02/06/2020] spironolactone  100 mg Oral Daily   Continuous Infusions: . sodium chloride 20 mL/hr at 02/05/20 0430     LOS: 9 days   Geradine Girt  [facility]   How to contact the [facility] Attending or Consulting provider [place] or covering  provider during after hours [place], for this patient?  1. Check the care team in [facility] and look for a) attending/consulting TR[facility] provider listed and b) the [facility] team listed 2. Log into [email or web address] and use [place]'s universal password to access. If you do not have the password, please contact the hospital operator. 3. Locate the [facility] provider you are looking for under [facility] and page to a number that you can be directly reached. 4. If you still have difficulty reaching the provider, please page the [facility] (Director on Call) for the Hospitalists listed on amion for assistance.  02/05/2020, 2:23 PM

## 2020-02-05 NOTE — Progress Notes (Signed)
Progress Note   Subjective   Noted to have a brief episode of atrial tachy/afib overnight, in sinus today. Net negative another 2.7L - now almost 20L negative since admission.  Inpatient Medications    Scheduled Meds:  apixaban  5 mg Oral BID   buprenorphine-naloxone  1 tablet Sublingual BID   furosemide  80 mg Intravenous TID   gabapentin  800 mg Oral TID   metolazone  2.5 mg Oral Daily   metoprolol tartrate  50 mg Oral BID   pantoprazole  40 mg Oral BID   potassium chloride  40 mEq Oral TID   pravastatin  20 mg Oral q1800   sodium chloride flush  3 mL Intravenous Q12H   spironolactone  50 mg Oral Daily   Continuous Infusions:  sodium chloride 20 mL/hr at 02/05/20 0430   potassium chloride 10 mEq (02/05/20 0824)   PRN Meds: sodium chloride, acetaminophen, ondansetron (ZOFRAN) IV, polyethylene glycol, senna-docusate, sodium chloride flush, zolpidem   Vital Signs    Vitals:   02/04/20 2300 02/05/20 0315 02/05/20 0428 02/05/20 0743  BP: (!) 111/92 115/76  112/74  Pulse: (!) 108 74  72  Resp: 12 17  13   Temp: 98.6 F (37 C) 97.6 F (36.4 C)  98.6 F (37 C)  TempSrc: Oral Oral  Oral  SpO2: 98% 93%  96%  Weight:   (!) 164.3 kg   Height:        Intake/Output Summary (Last 24 hours) at 02/05/2020 0913 Last data filed at 02/05/2020 0755 Gross per 24 hour  Intake 1610.11 ml  Output 4350 ml  Net -2739.89 ml   Filed Weights   02/03/20 0424 02/04/20 0448 02/05/20 0428  Weight: (!) 164.7 kg (!) 154.8 kg (!) 164.3 kg    Telemetry    Afib/a-tach overnight, sinus this am - Personally Reviewed  Physical Exam   General appearance: alert, no distress and morbidly obese Neck: no carotid bruit, no JVD and thyroid not enlarged, symmetric, no tenderness/mass/nodules Lungs: diminished breath sounds bilaterally Heart: regular rate and rhythm Abdomen: protuberant, firm Extremities: edema 1-2+ edema - wrapped Pulses: 2+ and symmetric Skin: Skin color,  texture, turgor normal. No rashes or lesions Neurologic: Grossly normal Psych: Anxious  Labs    Chemistry Recent Labs  Lab 01/30/20 0048 01/30/20 0048 01/31/20 0056 01/31/20 1623 02/04/20 0142 02/04/20 1221 02/05/20 0301  NA 134*   < > 136   < > 135 133* 135  K 3.3*   < > 2.7*   < > 2.9* 3.1* 2.4*  CL 89*   < > 89*   < > 89* 88* 88*  CO2 33*   < > 32   < > 32 30 34*  GLUCOSE 135*   < > 126*   < > 137* 142* 121*  BUN 20   < > 25*   < > 35* 34* 36*  CREATININE 1.19   < > 1.16   < > 1.31* 1.32* 1.29*  CALCIUM 9.7   < > 9.7   < > 9.7 9.9 9.6  ALBUMIN 3.9  --  3.9  --   --   --   --   GFRNONAA >60   < > >60   < > >60 >60 >60  ANIONGAP 12   < > 15   < > 14 15 13    < > = values in this interval not displayed.     Hematology Recent Labs  Lab 02/03/20 0140 02/04/20 0142  02/05/20 0301  WBC 9.8 9.5 8.9  RBC 5.45 5.44 5.34  HGB 13.5 13.4 13.0  HCT 43.8 43.3 42.1  MCV 80.4 79.6* 78.8*  MCH 24.8* 24.6* 24.3*  MCHC 30.8 30.9 30.9  RDW 15.9* 15.8* 15.6*  PLT 192 192 161     Patient ID   64 y.o. male w/ past medical history of chronic diastolic CHF, CAD (s/p prior Coronary CTA in 10/2019 showing mild plaque along RCA and LAD and FFR showing possible distal disease with medical therapy recommended), paroxysmal atrial fibrillation, HTN, HLD, COPD, cirrhosis and history of DVT/PE who is currently admitted for an acute CHF exacerbation.   Assessment & Plan    1.  Acute on chronic diastolic dysfunction Improving slowly with IV diuresis Continue current therapy Strict Is and Os Daily weights  2 paroxysmal atrial fibrillation Maintaining sinus rhythm Continue eliquis for stroke prevention  3. HTN Stable No change required today  4. H/o PTE Continue eliquis  5. OSA Compliance with CPAP is advised  6. Hypokalemia Requiring large doses of K to replace K+ 2.4 today, Magnesium 2.2. Replete - will change metolazone to QOD, increase aldactone to 100 mg daily Will need to  find adequate oral diuretic combination and be certain of efficacy prior to d/c to reduce change of readmission  Pixie Casino, MD, [facility], [place] Director of the Advanced Lipid Disorders &  Cardiovascular Risk Reduction Clinic Diplomate of the American Board of Clinical Lipidology Attending Cardiologist  Direct Dial: [phone]   Fax: [phone]  Website:  [email or web address]  02/05/2020 9:13 AM

## 2020-02-06 DIAGNOSIS — I10 Essential (primary) hypertension: Secondary | ICD-10-CM | POA: Diagnosis not present

## 2020-02-06 DIAGNOSIS — G4733 Obstructive sleep apnea (adult) (pediatric): Secondary | ICD-10-CM | POA: Diagnosis not present

## 2020-02-06 DIAGNOSIS — I50813 Acute on chronic right heart failure: Secondary | ICD-10-CM | POA: Diagnosis not present

## 2020-02-06 DIAGNOSIS — I480 Paroxysmal atrial fibrillation: Secondary | ICD-10-CM | POA: Diagnosis not present

## 2020-02-06 LAB — BASIC METABOLIC PANEL
Anion gap: 15 (ref 5–15)
BUN: 38 mg/dL — ABNORMAL HIGH (ref 8–23)
CO2: 29 mmol/L (ref 22–32)
Calcium: 9.6 mg/dL (ref 8.9–10.3)
Chloride: 88 mmol/L — ABNORMAL LOW (ref 98–111)
Creatinine, Ser: 1.38 mg/dL — ABNORMAL HIGH (ref 0.61–1.24)
GFR, Estimated: 57 mL/min — ABNORMAL LOW (ref >60)
Glucose, Bld: 132 mg/dL — ABNORMAL HIGH (ref 70–99)
Potassium: 3.1 mmol/L — ABNORMAL LOW (ref 3.5–5.1)
Sodium: 132 mmol/L — ABNORMAL LOW (ref 135–145)

## 2020-02-06 MED ORDER — BUMETANIDE 2 MG PO TABS
4.0000 mg | ORAL_TABLET | Freq: Two times a day (BID) | ORAL | Status: DC
  Administered 2020-02-06 – 2020-02-09 (×6): 4 mg via ORAL
  Filled 2020-02-06 (×9): qty 2

## 2020-02-06 MED ORDER — POLYETHYLENE GLYCOL 3350 17 G PO PACK: 17.0000 g | PACK | Freq: Every day | ORAL | Status: DC | PRN

## 2020-02-06 MED ORDER — POTASSIUM CHLORIDE 10 MEQ/100ML IV SOLN
10.0000 meq | INTRAVENOUS | Status: AC
  Administered 2020-02-06 (×3): 10 meq via INTRAVENOUS
  Filled 2020-02-06 (×3): qty 100

## 2020-02-06 NOTE — Plan of Care (Signed)

## 2020-02-06 NOTE — Progress Notes (Signed)
Physical Therapy Treatment Patient Details Name: Charles Gray MRN: [id number] DOB: [birth year] Today's Date: 02/06/2020    History of Present Illness Pt adm with acute on chronic rt heart failure. PMH - chf, htn, osa, PE/DVT, afib, obesity, chronic pain,     PT Comments    Focus of session to work on stamina for climbing 27 steps into pt home. Pt min guard for mobility. Pt able to step up/down one step x30. Pt with 4/4 DoE afterwards and requests to sit back down. Pt apprehensive about going home alone. PT will continue to work on building pt endurance to be safe in his home environment.    Follow Up Recommendations  Home health PT     Equipment Recommendations  Other (comment) (Rollator)       Precautions / Restrictions Precautions Precautions: Fall Restrictions Weight Bearing Restrictions: No    Mobility  Bed Mobility               General bed mobility comments: Pt up in chair  Transfers Overall transfer level: Needs assistance Equipment used: Rolling walker (2 wheeled) Transfers: Sit to/from Stand Sit to Stand: Min guard         General transfer comment: Incr time and effort from low chair  Ambulation/Gait Ambulation/Gait assistance: Min guard Gait Distance (Feet): 20 Feet Assistive device: None Gait Pattern/deviations: Step-through pattern;Decreased stride length;Trunk flexed;Wide base of support Gait velocity: decr Gait velocity interpretation: <1.31 ft/sec, indicative of household ambulator General Gait Details: use of bed rail and min guard for safety to walk to foot of bed    Stairs Stairs: Yes Stairs assistance: Min guard Stair Management: Two rails;Backwards;Forwards Number of Stairs: 1 General stair comments: Pt able to power up and down 1 step forward and back x 30 for simulation of climbing stairs        Balance Overall balance assessment: Needs assistance Sitting-balance support: No upper extremity supported;Feet supported Sitting  balance-Leahy Scale: Good     Standing balance support: No upper extremity supported;During functional activity Standing balance-Leahy Scale: Fair Standing balance comment: statically                            Cognition Arousal/Alertness: Awake/alert Behavior During Therapy: WFL for tasks assessed/performed Overall Cognitive Status: Within Functional Limits for tasks assessed                                           General Comments General comments (skin integrity, edema, etc.): HR 65-85bpm      Pertinent Vitals/Pain Pain Assessment: Faces Faces Pain Scale: Hurts little more Pain Location: back Pain Descriptors / Indicators: Grimacing;Tightness;Aching;Sore Pain Intervention(s): Limited activity within patient's tolerance           PT Goals (current goals can now be found in the care plan section) Acute Rehab PT Goals Patient Stated Goal: return home PT Goal Formulation: With patient Time For Goal Achievement: 02/11/20 Potential to Achieve Goals: Good Progress towards PT goals: Progressing toward goals    Frequency    Min 3X/week      PT Plan Discharge plan needs to be updated       AM-PAC PT "6 Clicks" Mobility   Outcome Measure  Help needed turning from your back to your side while in a flat bed without using bedrails?: None Help needed moving from lying  on your back to sitting on the side of a flat bed without using bedrails?: None Help needed moving to and from a bed to a chair (including a wheelchair)?: A Little Help needed standing up from a chair using your arms (e.g., wheelchair or bedside chair)?: A Little Help needed to walk in hospital room?: A Little Help needed climbing 3-5 steps with a railing? : A Little 6 Click Score: 20    End of Session Equipment Utilized During Treatment: Gait belt Activity Tolerance: Patient limited by fatigue Patient left: in chair;with call bell/phone within reach Nurse Communication:  Mobility status PT Visit Diagnosis: Other abnormalities of gait and mobility (R26.89);Muscle weakness (generalized) (M62.81)     Time: 7841-2820 PT Time Calculation (min) (ACUTE ONLY): 14 min  Charges:  $Gait Training: 8-22 mins                     [name] Donate B. Migdalia Dk PT, DPT Acute Rehabilitation Services Pager [phone] Office [phone]    [place] 02/06/2020, 4:40 PM

## 2020-02-06 NOTE — Progress Notes (Signed)
Progress Note   Subjective   Output not recorded yesterday. Weight is stable. Potassium better at 3.1 today. Creatinine stable.   Inpatient Medications    Scheduled Meds: . apixaban  5 mg Oral BID  . buprenorphine-naloxone  1 tablet Sublingual BID  . furosemide  80 mg Intravenous TID  . gabapentin  800 mg Oral TID  . metolazone  2.5 mg Oral Q M,W,F  . metoprolol tartrate  50 mg Oral BID  . pantoprazole  40 mg Oral BID  . potassium chloride  60 mEq Oral TID  . pravastatin  20 mg Oral q1800  . sodium chloride flush  3 mL Intravenous Q12H  . spironolactone  100 mg Oral Daily   Continuous Infusions: . sodium chloride 20 mL/hr at 02/05/20 0430  . potassium chloride 10 mEq (02/06/20 0825)   PRN Meds: sodium chloride, acetaminophen, ondansetron (ZOFRAN) IV, polyethylene glycol, senna-docusate, sodium chloride flush, zolpidem   Vital Signs    Vitals:   02/05/20 2000 02/05/20 2100 02/06/20 0500 02/06/20 0717  BP: 126/74 106/66  128/76  Pulse:  67  65  Resp:  15  13  Temp:  98.2 F (36.8 C)  99.1 F (37.3 C)  TempSrc:  Oral  Oral  SpO2:  99%  93%  Weight:   (!) 164.6 kg   Height:        Intake/Output Summary (Last 24 hours) at 02/06/2020 0912 Last data filed at 02/05/2020 1500 Gross per 24 hour  Intake 983.18 ml  Output --  Net 983.18 ml   Filed Weights   02/04/20 0448 02/05/20 0428 02/06/20 0500  Weight: (!) 154.8 kg (!) 164.3 kg (!) 164.6 kg    Telemetry    Sinus rhythm - Personally Reviewed  Physical Exam   General appearance: alert, no distress and morbidly obese Neck: no carotid bruit, no JVD and thyroid not enlarged, symmetric, no tenderness/mass/nodules Lungs: diminished breath sounds bilaterally Heart: regular rate and rhythm Abdomen: protuberant, firm Extremities: edema 1-2+ edema - wrapped Pulses: 2+ and symmetric Skin: Skin color, texture, turgor normal. No rashes or lesions Neurologic: Grossly normal Psych: Anxious  Labs     Chemistry Recent Labs  Lab 01/31/20 0056 01/31/20 1623 02/05/20 0301 02/05/20 1522 02/06/20 0049  NA 136   < > 135 133* 132*  K 2.7*   < > 2.4* 2.8* 3.1*  CL 89*   < > 88* 89* 88*  CO2 32   < > 34* 31 29  GLUCOSE 126*   < > 121* 134* 132*  BUN 25*   < > 36* 35* 38*  CREATININE 1.16   < > 1.29* 1.36* 1.38*  CALCIUM 9.7   < > 9.6 9.7 9.6  ALBUMIN 3.9  --   --   --   --   GFRNONAA >60   < > >60 58* 57*  ANIONGAP 15   < > 13 13 15    < > = values in this interval not displayed.     Hematology Recent Labs  Lab 02/03/20 0140 02/04/20 0142 02/05/20 0301  WBC 9.8 9.5 8.9  RBC 5.45 5.44 5.34  HGB 13.5 13.4 13.0  HCT 43.8 43.3 42.1  MCV 80.4 79.6* 78.8*  MCH 24.8* 24.6* 24.3*  MCHC 30.8 30.9 30.9  RDW 15.9* 15.8* 15.6*  PLT 192 192 161     Patient ID   64 y.o. male w/ past medical history of chronic diastolic CHF, CAD (s/p prior Coronary CTA in 10/2019 showing mild  plaque along RCA and LAD and FFR showing possible distal disease with medical therapy recommended), paroxysmal atrial fibrillation, HTN, HLD, COPD, cirrhosis and history of DVT/PE who is currently admitted for an acute CHF exacerbation.   Assessment & Plan    1.  Acute on chronic diastolic dysfunction Improving slowly with IV diuresis - on lasix 80 mg IV TID - will need to switch to an oral regimen to see if we can maintain diuresis before discharge. Recommend switch to Bumex 4 mg BID. Continue current therapy Strict Is and Os (output not recorded) Daily weights - stable  2 paroxysmal atrial fibrillation Maintaining sinus rhythm Continue eliquis for stroke prevention  3. HTN Stable No change required today  4. H/o PTE Continue eliquis  5. OSA Compliance with CPAP is advised  6. Hypokalemia Requiring large doses of K to replace K+ 2.4 today, Magnesium 2.2. Replete - will change metolazone to QOD, increase aldactone to 100 mg daily  Pixie Casino, MD, [facility], [place] Director of the Advanced Lipid Disorders &  Cardiovascular Risk Reduction Clinic Diplomate of the American Board of Clinical Lipidology Attending Cardiologist  Direct Dial: [phone]  Fax: [phone]  Website:  [email or web address]  02/06/2020 9:12 AM

## 2020-02-06 NOTE — Progress Notes (Signed)
Progress Note    Charles Gray  SAY:[id number] DOB: 10/27/1955  DOA: 01/26/2020 PCP: Enid Skeens., MD    Brief Narrative:     Medical records reviewed and are as summarized below:  Charles Gray is an 64 y.o. male with history of paroxysmal A. fib on Eliquis, probable chronic right heart failure, hypertension, possible cirrhosis, OSA and PE/DVT in 05/2019 direct up to ED by his cardiologist for refractory bilateral lower extremity edema and weight gain.  Started on IV Lasix and admitted for fluid overload/bilateral lower extremity edema and concern for acute on chronic right heart failure.  cardiology consulted, and recommended IV diuresis.  Patient has been diuresing well complicated by hypokalemia.  Being changed to PO diuretic and will need to maintain diuresis prior to d/c.    Assessment/Plan:   Principal Problem:   Acute on chronic right-sided heart failure (HCC) Active Problems:   Benign essential HTN   OSA (obstructive sleep apnea)   Atrial fibrillation (HCC)   Acute on chronic right heart failure (HCC)   Acute on chronic diastolic CHF  -diuretics per cardiology- changed to Bumex daily and metolazone a few times/week -ABIs ok-- UNNA boot placed -Leg elevation, fluid and sodium restriction -daily weights/strict I/Os -down 26L thus far (net: 17) -suspect at home fluid restriction will be an issue  Paroxysmal atrial fibrillation:  -Continue Eliquis for anticoagulation. -Started on metoprolol  OSA on CPAP -Refusing CPAP -not compliant at home either  History of DVT/PE -Continue Eliquis  Essential hypertension: Normotensive -Now on metoprolol and diuretics as above  Chronic pain syndrome: Stable -Continue home Suboxone and gabapentin  Thrombocytopenia:  -Resolved. -Continue monitoring  Hypokalemia:  -replete aggressively   Morbid obesity Body mass index is 37.94 kg/m.   Family Communication/Anticipated D/C date and plan/Code  Status   DVT prophylaxis: eliquis Code Status: Full Code.  Disposition Plan: Status is: Inpatient  Remains inpatient appropriate because:IV treatments appropriate due to intensity of illness or inability to take PO   Dispo: The patient is from: Home              Anticipated d/c is to: Home              Anticipated d/c date is: when ok with cardiology (suspect 1-2 days)              Patient currently is not medically stable to d/c.         Medical Consultants:    cards  Subjective:   Having soft stools  Objective:    Vitals:   02/05/20 2000 02/05/20 2100 02/06/20 0500 02/06/20 0717  BP: 126/74 106/66  128/76  Pulse:  67  65  Resp:  15  13  Temp:  98.2 F (36.8 C)  99.1 F (37.3 C)  TempSrc:  Oral  Oral  SpO2:  99%  93%  Weight:   (!) 164.6 kg   Height:        Intake/Output Summary (Last 24 hours) at 02/06/2020 1021 Last data filed at 02/06/2020 2355 Gross per 24 hour  Intake 1223.18 ml  Output --  Net 1223.18 ml   Filed Weights   02/04/20 0448 02/05/20 0428 02/06/20 0500  Weight: (!) 154.8 kg (!) 164.3 kg (!) 164.6 kg    Exam:  General: Appearance:    Obese male in no acute distress     Lungs:     respirations unlabored  Heart:    Normal heart rate. .   MS:  All extremities are intact. UNNA boots b/l  Neurologic:   Awake, alert, oriented x 3. No apparent focal neurological           defect.           Data Reviewed:   I have personally reviewed following labs and imaging studies:  Labs: Labs show the following:   Basic Metabolic Panel: Recent Labs  Lab 01/31/20 0056 01/31/20 1623 02/04/20 0142 02/04/20 0142 02/04/20 1221 02/04/20 1221 02/05/20 0301 02/05/20 0301 02/05/20 1522 02/06/20 0049  NA 136   < > 135  --  133*  --  135  --  133* 132*  K 2.7*   < > 2.9*   < > 3.1*   < > 2.4*   < > 2.8* 3.1*  CL 89*   < > 89*  --  88*  --  88*  --  89* 88*  CO2 32   < > 32  --  30  --  34*  --  31 29  GLUCOSE 126*   < > 137*  --  142*   --  121*  --  134* 132*  BUN 25*   < > 35*  --  34*  --  36*  --  35* 38*  CREATININE 1.16   < > 1.31*  --  1.32*  --  1.29*  --  1.36* 1.38*  CALCIUM 9.7   < > 9.7  --  9.9  --  9.6  --  9.7 9.6  MG 2.2  --   --   --  2.2  --   --   --   --   --   PHOS 4.0  --   --   --   --   --   --   --   --   --    < > = values in this interval not displayed.   GFR Estimated Creatinine Clearance: 96.5 mL/min (A) (by C-G formula based on SCr of 1.38 mg/dL (H)). Liver Function Tests: Recent Labs  Lab 01/31/20 0056  ALBUMIN 3.9   No results for input(s): LIPASE, AMYLASE in the last 168 hours. No results for input(s): AMMONIA in the last 168 hours. Coagulation profile No results for input(s): INR, PROTIME in the last 168 hours.  CBC: Recent Labs  Lab 02/01/20 0031 02/02/20 0059 02/03/20 0140 02/04/20 0142 02/05/20 0301  WBC 11.5* 9.3 9.8 9.5 8.9  HGB 13.2 13.0 13.5 13.4 13.0  HCT 42.7 41.8 43.8 43.3 42.1  MCV 79.7* 79.9* 80.4 79.6* 78.8*  PLT 210 194 192 192 161   Cardiac Enzymes: No results for input(s): CKTOTAL, CKMB, CKMBINDEX, TROPONINI in the last 168 hours. BNP (last 3 results) Recent Labs    02/27/19 1106  PROBNP 37   CBG: No results for input(s): GLUCAP in the last 168 hours. D-Dimer: No results for input(s): DDIMER in the last 72 hours. Hgb A1c: No results for input(s): HGBA1C in the last 72 hours. Lipid Profile: No results for input(s): CHOL, HDL, LDLCALC, TRIG, CHOLHDL, LDLDIRECT in the last 72 hours. Thyroid function studies: No results for input(s): TSH, T4TOTAL, T3FREE, THYROIDAB in the last 72 hours.  Invalid input(s): FREET3 Anemia work up: No results for input(s): VITAMINB12, FOLATE, FERRITIN, TIBC, IRON, RETICCTPCT in the last 72 hours. Sepsis Labs: Recent Labs  Lab 02/02/20 0059 02/03/20 0140 02/04/20 0142 02/05/20 0301  WBC 9.3 9.8 9.5 8.9    Microbiology Recent Results (from the past 240  hour(s))  MRSA PCR Screening     Status: None    Collection Time: 01/27/20  6:46 PM   Specimen: Nasal Mucosa; Nasopharyngeal  Result Value Ref Range Status   MRSA by PCR NEGATIVE NEGATIVE Final    Comment:        The GeneXpert MRSA Assay (FDA approved for NASAL specimens only), is one component of a comprehensive MRSA colonization surveillance program. It is not intended to diagnose MRSA infection nor to guide or monitor treatment for MRSA infections. Performed at [facility], [place] [address] [place], [place] [address]     Procedures and diagnostic studies:  No results found.  Medications:   . apixaban  5 mg Oral BID  . bumetanide  4 mg Oral BID  . buprenorphine-naloxone  1 tablet Sublingual BID  . gabapentin  800 mg Oral TID  . metolazone  2.5 mg Oral Q M,W,F  . metoprolol tartrate  50 mg Oral BID  . pantoprazole  40 mg Oral BID  . potassium chloride  60 mEq Oral TID  . pravastatin  20 mg Oral q1800  . sodium chloride flush  3 mL Intravenous Q12H  . spironolactone  100 mg Oral Daily   Continuous Infusions: . sodium chloride 20 mL/hr at 02/05/20 0430  . potassium chloride 10 mEq (02/06/20 0951)     LOS: 10 days   Geradine Girt  [facility]   How to contact the [facility] Attending or Consulting provider [place] or covering provider during after hours [place], for this patient?  1. Check the care team in [facility] and look for a) attending/consulting [facility] provider listed and b) the [facility] team listed 2. Log into [email or web address] and use [place]'s universal password to access. If you do not have the password, please contact the hospital operator. 3. Locate the [facility] provider you are looking for under [facility] and page to a number that you can be directly reached. 4. If you still have difficulty reaching the provider, please page the [facility] (Director on Call) for the Hospitalists listed on amion for assistance.  02/06/2020, 10:21 AM

## 2020-02-07 ENCOUNTER — Encounter (HOSPITAL_COMMUNITY): Payer: Medicare HMO | Admitting: Cardiology

## 2020-02-07 DIAGNOSIS — R600 Localized edema: Secondary | ICD-10-CM

## 2020-02-07 DIAGNOSIS — E669 Obesity, unspecified: Secondary | ICD-10-CM

## 2020-02-07 DIAGNOSIS — G4733 Obstructive sleep apnea (adult) (pediatric): Secondary | ICD-10-CM | POA: Diagnosis not present

## 2020-02-07 DIAGNOSIS — I50813 Acute on chronic right heart failure: Secondary | ICD-10-CM | POA: Diagnosis not present

## 2020-02-07 DIAGNOSIS — I480 Paroxysmal atrial fibrillation: Secondary | ICD-10-CM | POA: Diagnosis not present

## 2020-02-07 DIAGNOSIS — I10 Essential (primary) hypertension: Secondary | ICD-10-CM | POA: Diagnosis not present

## 2020-02-07 LAB — BASIC METABOLIC PANEL
Anion gap: 13 (ref 5–15)
BUN: 37 mg/dL — ABNORMAL HIGH (ref 8–23)
CO2: 31 mmol/L (ref 22–32)
Calcium: 9.7 mg/dL (ref 8.9–10.3)
Chloride: 91 mmol/L — ABNORMAL LOW (ref 98–111)
Creatinine, Ser: 1.33 mg/dL — ABNORMAL HIGH (ref 0.61–1.24)
GFR, Estimated: 60 mL/min — ABNORMAL LOW (ref >60)
Glucose, Bld: 123 mg/dL — ABNORMAL HIGH (ref 70–99)
Potassium: 3.1 mmol/L — ABNORMAL LOW (ref 3.5–5.1)
Sodium: 135 mmol/L (ref 135–145)

## 2020-02-07 LAB — CBC
HCT: 42.6 % (ref 39.0–52.0)
Hemoglobin: 13.5 g/dL (ref 13.0–17.0)
MCH: 25.1 pg — ABNORMAL LOW (ref 26.0–34.0)
MCHC: 31.7 g/dL (ref 30.0–36.0)
MCV: 79.2 fL — ABNORMAL LOW (ref 80.0–100.0)
Platelets: 191 10*3/uL (ref 150–400)
RBC: 5.38 MIL/uL (ref 4.22–5.81)
RDW: 15.7 % — ABNORMAL HIGH (ref 11.5–15.5)
WBC: 11.3 10*3/uL — ABNORMAL HIGH (ref 4.0–10.5)
nRBC: 0 % (ref 0.0–0.2)

## 2020-02-07 LAB — MAGNESIUM: Magnesium: 2.2 mg/dL (ref 1.7–2.4)

## 2020-02-07 MED ORDER — POTASSIUM CHLORIDE 10 MEQ/100ML IV SOLN
10.0000 meq | INTRAVENOUS | Status: AC
  Administered 2020-02-07 – 2020-02-08 (×6): 10 meq via INTRAVENOUS
  Filled 2020-02-07 (×4): qty 100

## 2020-02-07 MED ORDER — POTASSIUM CHLORIDE CRYS ER 20 MEQ PO TBCR
60.0000 meq | EXTENDED_RELEASE_TABLET | Freq: Three times a day (TID) | ORAL | Status: DC
  Administered 2020-02-07 – 2020-02-09 (×11): 60 meq via ORAL
  Filled 2020-02-07 (×10): qty 3

## 2020-02-07 NOTE — Progress Notes (Signed)
RN called to room patient states that IV is leaking. Two attempts made to get another IV without success. RN placed IV consult.

## 2020-02-07 NOTE — Consult Note (Signed)
Brief consult note  I was asked to offer any ideas regarding hypokalemia in this gentleman with heart failure currently undergoing aggressive diuresis.  On 10/25-  Potassium was noted to be 2.4 which represents a significant potassium deficit- one that was going to be difficult to overcome in the short term. Over the course of the next 2 days, potassium has improved even though it stayed at 3.1 from yesterday to today.  He should be on an adequate replacement regimen and it has been escalated appropriately.  Now on kdur 60 meq for 3 doses per day- total of 180 meq as well as trying to increase K in diet.  I like that potassium is now being given in between meals to help with absorption.  I have repeated the IV dosing for today just in case he is not absorbing the potassium well via oral route.  I also agree with aldactone- dose has been escalated up to 100 mg daily just recently.    I anticipate that potassium will continue to rise on this regimen.  I do not have anything more additional to offer.    I will continue to watch his labs and make adjustments as I see fit  Charles Gray

## 2020-02-07 NOTE — Progress Notes (Signed)
Progress Note   Subjective    Net negative 587cc overnight - in sinus rhythm. Potassium low, but improved at 3.1. Creatinine stable. Slight leukocytosis today at 11.3 (was between 8-9 and 11.5 on 10/21) - had complained of nausea yesterday.  Inpatient Medications    Scheduled Meds: . apixaban  5 mg Oral BID  . bumetanide  4 mg Oral BID  . buprenorphine-naloxone  1 tablet Sublingual BID  . gabapentin  800 mg Oral TID  . metolazone  2.5 mg Oral Q M,W,F  . metoprolol tartrate  50 mg Oral BID  . pantoprazole  40 mg Oral BID  . potassium chloride  60 mEq Oral TID AC & HS  . pravastatin  20 mg Oral q1800  . sodium chloride flush  3 mL Intravenous Q12H  . spironolactone  100 mg Oral Daily   Continuous Infusions: . sodium chloride 20 mL/hr at 02/05/20 0430   PRN Meds: sodium chloride, acetaminophen, ondansetron (ZOFRAN) IV, polyethylene glycol, senna-docusate, sodium chloride flush, zolpidem   Vital Signs    Vitals:   02/07/20 0311 02/07/20 0750 02/07/20 0812 02/07/20 0908  BP: 111/83 126/68    Pulse: 67   65  Resp: 11 13 16    Temp: 97.9 F (36.6 C) 98.4 F (36.9 C)    TempSrc: Oral Oral    SpO2: 99%     Weight: (!) 164.5 kg     Height:        Intake/Output Summary (Last 24 hours) at 02/07/2020 4967 Last data filed at 02/07/2020 0900 Gross per 24 hour  Intake 2253 ml  Output 3200 ml  Net -947 ml   Filed Weights   02/05/20 0428 02/06/20 0500 02/07/20 0311  Weight: (!) 164.3 kg (!) 164.6 kg (!) 164.5 kg    Telemetry    Sinus rhythm - Personally Reviewed  Physical Exam   General appearance: alert, no distress and morbidly obese Neck: no carotid bruit, no JVD and thyroid not enlarged, symmetric, no tenderness/mass/nodules Lungs: diminished breath sounds bilaterally Heart: regular rate and rhythm Abdomen: soft, non-tender Extremities: edema 1+ edema - wrapped Pulses: 2+ and symmetric Skin: Skin color, texture, turgor normal. No rashes or  lesions Neurologic: Grossly normal Psych: Anxious  Labs    Chemistry Recent Labs  Lab 02/05/20 1522 02/06/20 0049 02/07/20 0033  NA 133* 132* 135  K 2.8* 3.1* 3.1*  CL 89* 88* 91*  CO2 31 29 31   GLUCOSE 134* 132* 123*  BUN 35* 38* 37*  CREATININE 1.36* 1.38* 1.33*  CALCIUM 9.7 9.6 9.7  GFRNONAA 58* 57* 60*  ANIONGAP 13 15 13      Hematology Recent Labs  Lab 02/04/20 0142 02/05/20 0301 02/07/20 0033  WBC 9.5 8.9 11.3*  RBC 5.44 5.34 5.38  HGB 13.4 13.0 13.5  HCT 43.3 42.1 42.6  MCV 79.6* 78.8* 79.2*  MCH 24.6* 24.3* 25.1*  MCHC 30.9 30.9 31.7  RDW 15.8* 15.6* 15.7*  PLT 192 161 191     Patient ID   64 y.o. male w/ past medical history of chronic diastolic CHF, CAD (s/p prior Coronary CTA in 10/2019 showing mild plaque along RCA and LAD and FFR showing possible distal disease with medical therapy recommended), paroxysmal atrial fibrillation, HTN, HLD, COPD, cirrhosis and history of DVT/PE who is currently admitted for an acute CHF exacerbation.   Assessment & Plan    1.  Acute on chronic diastolic dysfunction Remained net negative yesterday on current combination of po Bumex 4 mg BID and  aldactone 100 mg daily - given metolazone today. Potassium remains low normal - may need to stop metolazone if hypokalemia worsens. Still requiring high doses of potassium.   Continue current therapy Strict Is and Os (output not recorded) Daily weights - stable  2 paroxysmal atrial fibrillation Maintaining sinus rhythm Continue eliquis for stroke prevention  3. HTN Stable No change required today  4. H/o PTE Continue eliquis  5. OSA Compliance with CPAP is advised  6. Hypokalemia Requiring large doses of K to replace K+ 2.4 today, Magnesium 2.2. Replete - will change metolazone to QOD, increase aldactone to 100 mg daily. May need to d/c metolazone - consider nephrology input.  Pixie Casino, MD, [facility], [place] Director of  the [facility] Diplomate of the American Board of Clinical Lipidology Attending Cardiologist  Direct Dial: [phone]  Fax: [phone]  Website:  [email or web address]  02/07/2020 9:52 AM

## 2020-02-07 NOTE — Progress Notes (Signed)
PROGRESS NOTE  Charles Gray QBH:[id number] DOB: 28-Oct-1955   PCP: Enid Skeens., MD  Patient is from: Home  DOA: 01/26/2020 LOS: 63  Chief complaints: Leg swelling  Brief Narrative / Interim history: 64 year old man with history of paroxysmal A. fib on Eliquis, probable chronic right heart failure, hypertension, possible cirrhosis, OSA and PE/DVT in 05/2019 direct up to ED by his cardiologist for refractory bilateral lower extremity edema and weight gain.   In ED, hemodynamically stable.  95% on room air.  CXR with bibasilar opacities.  Basic labs without significant finding other than thrombocytopenia to 130.  Started on IV Lasix and admitted for fluid overload/bilateral lower extremity edema and concern for acute on chronic right heart failure.  The next day, cardiology consulted, and started on IV Lasix and metolazone.  ABI was normal.  Unna boot applied.  Currently on Bumex, metolazone, Aldactone and potassium supplementation.  Subjective: Seen and examined earlier this morning.  No major events overnight of this morning.  Still with some pain in his legs but no cardiopulmonary symptoms.  Denies GI or UTI symptoms.  Excellent urine output.  Objective: Vitals:   02/07/20 0750 02/07/20 0812 02/07/20 0908 02/07/20 1038  BP: 126/68   121/82  Pulse:   65   Resp: 13 16  15   Temp: 98.4 F (36.9 C)   (!) 97.4 F (36.3 C)  TempSrc: Oral   Oral  SpO2:      Weight:      Height:        Intake/Output Summary (Last 24 hours) at 02/07/2020 1153 Last data filed at 02/07/2020 1038 Gross per 24 hour  Intake 2013 ml  Output 4925 ml  Net -2912 ml   Filed Weights   02/05/20 0428 02/06/20 0500 02/07/20 0311  Weight: (!) 164.3 kg (!) 164.6 kg (!) 164.5 kg    Examination:  GENERAL: No apparent distress.  Nontoxic. HEENT: MMM.  Vision and hearing grossly intact.  NECK: Supple.  No apparent JVD but difficult exam due to body habitus.  RESP: On room air.  No IWOB.  Fair aeration  bilaterally. CVS:  RRR. Heart sounds normal.  ABD/GI/GU: BS+. Abd soft, NTND.  MSK/EXT:  Moves extremities.  Ace wrap over both legs. SKIN: Ace wrap over both legs. NEURO: Awake, alert and oriented appropriately.  No apparent focal neuro deficit. PSYCH: Calm. Normal affect. Procedures:  None  Microbiology summarized: COVID-19 PCR negative. Influenza PCR negative.  Assessment & Plan: Acute on chronic diastolic CHF/fluid overload/bilateral lower extremity edema:   TTE in 07/2019 with EF of 55 to 60%, G1-DD. RVSF was not well-visualized. RHC at the same time reassuring.  BNP within normal but unreliable given obesity.  Diuresing well with Bumex, metolazone and Aldactone.  Renal function stable.  Refractory hypokalemia. -Continue Bumex, Aldactone and metolazone per cardiology -Aggressive potassium supplementation -Monitor fluid status, renal function and electrolytes. -Continue BLE wrapping.  Paroxysmal atrial fibrillation: Rate controlled and in sinus rhythm.  Was on Cardizem -Continue Eliquis and metoprolol.  OSA not compliant with CPAP -Refusing CPAP here.  History of DVT/PE -Continue Eliquis  Essential hypertension: Normotensive -Continue metoprolol and diuretics as above.  Chronic pain syndrome: Stable -Continue home Suboxone and gabapentin  Thrombocytopenia: Resolved.  Refractory hypokalemia: 3.1.  Mg 2.2.  Likely due to diuretics. -Cardiology reduce metolazone to every other day -Continue Aldactone 100 mg daily -Nephrology consulted for input  Mild hyponatremia: Na 135.  Relatively stable. -Continue monitoring  Class II obesity Body mass index is 37.92  kg/m.         DVT prophylaxis:   apixaban (ELIQUIS) tablet 5 mg    Code Status: Full code Family Communication: Patient and/or RN. Available if any question.  Status is: Inpatient  Remains inpatient appropriate because:Persistent severe electrolyte disturbances and Inpatient level of care appropriate due  to severity of illness   Dispo: The patient is from: Home              Anticipated d/c is to: Home              Anticipated d/c date is: 2 days or when cleared by cardiology              Patient currently is not medically stable to d/c.            Consultants:  Cardiology-following   Sch Meds:  Scheduled Meds: . apixaban  5 mg Oral BID  . bumetanide  4 mg Oral BID  . buprenorphine-naloxone  1 tablet Sublingual BID  . gabapentin  800 mg Oral TID  . metolazone  2.5 mg Oral Q M,W,F  . metoprolol tartrate  50 mg Oral BID  . pantoprazole  40 mg Oral BID  . potassium chloride  60 mEq Oral TID AC & HS  . pravastatin  20 mg Oral q1800  . sodium chloride flush  3 mL Intravenous Q12H  . spironolactone  100 mg Oral Daily   Continuous Infusions: . sodium chloride 20 mL/hr at 02/05/20 0430   PRN Meds:.sodium chloride, acetaminophen, ondansetron (ZOFRAN) IV, polyethylene glycol, senna-docusate, sodium chloride flush, zolpidem  Antimicrobials: Anti-infectives (From admission, onward)   None       I have personally reviewed the following labs and images: CBC: Recent Labs  Lab 02/02/20 0059 02/03/20 0140 02/04/20 0142 02/05/20 0301 02/07/20 0033  WBC 9.3 9.8 9.5 8.9 11.3*  HGB 13.0 13.5 13.4 13.0 13.5  HCT 41.8 43.8 43.3 42.1 42.6  MCV 79.9* 80.4 79.6* 78.8* 79.2*  PLT 194 192 192 161 191   BMP &GFR Recent Labs  Lab 02/04/20 1221 02/05/20 0301 02/05/20 1522 02/06/20 0049 02/07/20 0033 02/07/20 0658  NA 133* 135 133* 132* 135  --   K 3.1* 2.4* 2.8* 3.1* 3.1*  --   CL 88* 88* 89* 88* 91*  --   CO2 30 34* 31 29 31   --   GLUCOSE 142* 121* 134* 132* 123*  --   BUN 34* 36* 35* 38* 37*  --   CREATININE 1.32* 1.29* 1.36* 1.38* 1.33*  --   CALCIUM 9.9 9.6 9.7 9.6 9.7  --   MG 2.2  --   --   --   --  2.2   Estimated Creatinine Clearance: 100.2 mL/min (A) (by C-G formula based on SCr of 1.33 mg/dL (H)). Liver & Pancreas: No results for input(s): AST, ALT,  ALKPHOS, BILITOT, PROT, ALBUMIN in the last 168 hours. No results for input(s): LIPASE, AMYLASE in the last 168 hours. No results for input(s): AMMONIA in the last 168 hours. Diabetic: No results for input(s): HGBA1C in the last 72 hours. No results for input(s): GLUCAP in the last 168 hours. Cardiac Enzymes: No results for input(s): CKTOTAL, CKMB, CKMBINDEX, TROPONINI in the last 168 hours. Recent Labs    02/27/19 1106  PROBNP 37   Coagulation Profile: No results for input(s): INR, PROTIME in the last 168 hours. Thyroid Function Tests: No results for input(s): TSH, T4TOTAL, FREET4, T3FREE, THYROIDAB in the last 72 hours. Lipid  Profile: No results for input(s): CHOL, HDL, LDLCALC, TRIG, CHOLHDL, LDLDIRECT in the last 72 hours. Anemia Panel: No results for input(s): VITAMINB12, FOLATE, FERRITIN, TIBC, IRON, RETICCTPCT in the last 72 hours. Urine analysis:    Component Value Date/Time   COLORURINE YELLOW 01/31/2020 [place] 01/31/2020 1505   LABSPEC 1.009 01/31/2020 1505   PHURINE 7.0 01/31/2020 1505   GLUCOSEU NEGATIVE 01/31/2020 1505   HGBUR NEGATIVE 01/31/2020 1505   BILIRUBINUR NEGATIVE 01/31/2020 1505   KETONESUR NEGATIVE 01/31/2020 1505   PROTEINUR NEGATIVE 01/31/2020 1505   NITRITE NEGATIVE 01/31/2020 [place] 01/31/2020 1505   Sepsis Labs: Invalid input(s): PROCALCITONIN, [place]  Microbiology: No results found for this or any previous visit (from the past 240 hour(s)).  Radiology Studies: No results found.   Cristian Grieves T. Stover  If 7PM-7AM, please contact night-coverage [email or web address] 02/07/2020, 11:53 AM

## 2020-02-08 DIAGNOSIS — I50813 Acute on chronic right heart failure: Secondary | ICD-10-CM | POA: Diagnosis not present

## 2020-02-08 DIAGNOSIS — I480 Paroxysmal atrial fibrillation: Secondary | ICD-10-CM | POA: Diagnosis not present

## 2020-02-08 DIAGNOSIS — I10 Essential (primary) hypertension: Secondary | ICD-10-CM | POA: Diagnosis not present

## 2020-02-08 DIAGNOSIS — G4733 Obstructive sleep apnea (adult) (pediatric): Secondary | ICD-10-CM | POA: Diagnosis not present

## 2020-02-08 LAB — RENAL FUNCTION PANEL
Albumin: 3.7 g/dL (ref 3.5–5.0)
Anion gap: 13 (ref 5–15)
BUN: 36 mg/dL — ABNORMAL HIGH (ref 8–23)
CO2: 28 mmol/L (ref 22–32)
Calcium: 9.5 mg/dL (ref 8.9–10.3)
Chloride: 92 mmol/L — ABNORMAL LOW (ref 98–111)
Creatinine, Ser: 1.22 mg/dL (ref 0.61–1.24)
GFR, Estimated: >60 mL/min (ref >60)
Glucose, Bld: 117 mg/dL — ABNORMAL HIGH (ref 70–99)
Phosphorus: 3.3 mg/dL (ref 2.5–4.6)
Potassium: 3.6 mmol/L (ref 3.5–5.1)
Sodium: 133 mmol/L — ABNORMAL LOW (ref 135–145)

## 2020-02-08 LAB — CBC
HCT: 41.2 % (ref 39.0–52.0)
Hemoglobin: 13 g/dL (ref 13.0–17.0)
MCH: 25.1 pg — ABNORMAL LOW (ref 26.0–34.0)
MCHC: 31.6 g/dL (ref 30.0–36.0)
MCV: 79.7 fL — ABNORMAL LOW (ref 80.0–100.0)
Platelets: 175 10*3/uL (ref 150–400)
RBC: 5.17 MIL/uL (ref 4.22–5.81)
RDW: 15.6 % — ABNORMAL HIGH (ref 11.5–15.5)
WBC: 9.4 10*3/uL (ref 4.0–10.5)
nRBC: 0 % (ref 0.0–0.2)

## 2020-02-08 LAB — MAGNESIUM: Magnesium: 2.1 mg/dL (ref 1.7–2.4)

## 2020-02-08 LAB — POTASSIUM: Potassium: 4 mmol/L (ref 3.5–5.1)

## 2020-02-08 NOTE — Progress Notes (Signed)
Progress Note   Subjective   Net negative 3.5L overnight - in sinus rhythm. Potassium improved today to 3.6. Responsive to bumex - currently QOD. May not need it long term, but still carrying excess fluid - creatinine lower with diuresis. Overall 22L negative.  Inpatient Medications    Scheduled Meds: . apixaban  5 mg Oral BID  . bumetanide  4 mg Oral BID  . buprenorphine-naloxone  1 tablet Sublingual BID  . gabapentin  800 mg Oral TID  . metolazone  2.5 mg Oral Q M,W,F  . metoprolol tartrate  50 mg Oral BID  . pantoprazole  40 mg Oral BID  . potassium chloride  60 mEq Oral TID AC & HS  . pravastatin  20 mg Oral q1800  . sodium chloride flush  3 mL Intravenous Q12H  . spironolactone  100 mg Oral Daily   Continuous Infusions: . sodium chloride 20 mL/hr at 02/05/20 0430   PRN Meds: sodium chloride, acetaminophen, ondansetron (ZOFRAN) IV, polyethylene glycol, senna-docusate, sodium chloride flush, zolpidem   Vital Signs    Vitals:   02/07/20 2319 02/08/20 0339 02/08/20 0340 02/08/20 0721  BP: 102/75  117/81 132/74  Pulse: 66  62 72  Resp: 14  12 17   Temp: 98 F (36.7 C)  98 F (36.7 C) 97.6 F (36.4 C)  TempSrc: Oral  Oral Oral  SpO2: 98%  98%   Weight:  (!) 165.2 kg    Height:        Intake/Output Summary (Last 24 hours) at 02/08/2020 0839 Last data filed at 02/08/2020 0000 Gross per 24 hour  Intake 1040 ml  Output 4800 ml  Net -3760 ml   Filed Weights   02/06/20 0500 02/07/20 0311 02/08/20 0339  Weight: (!) 164.6 kg (!) 164.5 kg (!) 165.2 kg    Telemetry    Sinus rhythm - Personally Reviewed  Physical Exam   General appearance: alert, no distress and morbidly obese Neck: no carotid bruit, no JVD and thyroid not enlarged, symmetric, no tenderness/mass/nodules Lungs: diminished breath sounds bilaterally Heart: regular rate and rhythm Abdomen: soft, non-tender Extremities: edema 1+ edema - wrapped Pulses: 2+ and symmetric Skin: Skin color,  texture, turgor normal. No rashes or lesions Neurologic: Grossly normal Psych: Anxious  Labs    Chemistry Recent Labs  Lab 02/06/20 0049 02/07/20 0033 02/08/20 0044  NA 132* 135 133*  K 3.1* 3.1* 3.6  CL 88* 91* 92*  CO2 29 31 28   GLUCOSE 132* 123* 117*  BUN 38* 37* 36*  CREATININE 1.38* 1.33* 1.22  CALCIUM 9.6 9.7 9.5  ALBUMIN  --   --  3.7  GFRNONAA 57* 60* >60  ANIONGAP 15 13 13      Hematology Recent Labs  Lab 02/05/20 0301 02/07/20 0033 02/08/20 0044  WBC 8.9 11.3* 9.4  RBC 5.34 5.38 5.17  HGB 13.0 13.5 13.0  HCT 42.1 42.6 41.2  MCV 78.8* 79.2* 79.7*  MCH 24.3* 25.1* 25.1*  MCHC 30.9 31.7 31.6  RDW 15.6* 15.7* 15.6*  PLT 161 191 175     Patient ID   64 y.o. male w/ past medical history of chronic diastolic CHF, CAD (s/p prior Coronary CTA in 10/2019 showing mild plaque along RCA and LAD and FFR showing possible distal disease with medical therapy recommended), paroxysmal atrial fibrillation, HTN, HLD, COPD, cirrhosis and history of DVT/PE who is currently admitted for an acute CHF exacerbation.   Assessment & Plan    1.  Acute on chronic diastolic  dysfunction Significant diuresis yesterday with metolazone in addition to Bumex 4 mg BID and aldactone 100 mg daily. Potassium improved today to 3.6. Creatinine slightly improved as well. Weight now 165 kg, down from 175 kg at admit.  Continue current therapy Strict Is and Os (output not recorded) Daily weights - stable  2 paroxysmal atrial fibrillation Maintaining sinus rhythm Continue eliquis for stroke prevention  3. HTN Stable No change required today   4. H/o PTE Continue eliquis  5. OSA Compliance with CPAP is advised  6. Hypokalemia Requiring large doses of K to replace K+ 3.6 today, Magnesium 2.1 Appreciate nephrology recs - now on metolazone QOD,  aldactone 100 mg daily and Bumex 4 mg BID.   Probably close to discharge - will need close follow-up with primary cardiologist Dr. Harriet Masson in  [place].  Pixie Casino, MD, [facility], [facility] Director of the [facility] Lipid Disorders &  Cardiovascular Risk Reduction Clinic Diplomate of the American Board of Clinical Lipidology Attending Cardiologist  Direct Dial: [phone]  Fax: [phone]  Website:  [email or web address]  02/08/2020 8:39 AM

## 2020-02-08 NOTE — Plan of Care (Signed)
  Problem: Education: Goal: Ability to demonstrate management of disease process will improve Outcome: Progressing Goal: Ability to verbalize understanding of medication therapies will improve Outcome: Progressing   Problem: Activity: Goal: Capacity to carry out activities will improve Outcome: Progressing   Problem: Cardiac: Goal: Ability to achieve and maintain adequate cardiopulmonary perfusion will improve Outcome: Progressing   Problem: Education: Goal: Knowledge of General Education information will improve Description: Including pain rating scale, medication(s)/side effects and non-pharmacologic comfort measures Outcome: Progressing   Problem: Education: Goal: Knowledge of General Education information will improve Description: Including pain rating scale, medication(s)/side effects and non-pharmacologic comfort measures Outcome: Progressing

## 2020-02-08 NOTE — TOC Progression Note (Signed)
Transition of Care [facility]) - Progression Note    Patient Details  Name: Charles Gray MRN: [id number] Date of Birth: [birth year]  Transition of [facility]) CM/SW Contact  Zenon [facility], RN Phone Number: 02/08/2020, 12:06 PM  Clinical Narrative:    NCM spoke with patient's son, [name], informed him [employer] does not have any HD rollators here at the hospital.  Son states he will be able to go by the [employer] store at [facility] off [address].  He states he will go by after 4 pm either today or tomorrow.   Expected Discharge Plan: [place] Barriers to Discharge: Continued Medical Work up  Expected Discharge Plan and Services Expected Discharge Plan: [place]   Discharge Planning Services: CM Consult Post Acute Care Choice: Home Health, Durable Medical Equipment Living arrangements for the past 2 months: Apartment                 DME Arranged: Walker rolling with seat DME Agency: [employer] Date DME Agency Contacted: 02/02/20 Time DME Agency Contacted: [phone] Representative spoke with at DME Agency: [place]: RN, Nurse's Aide [place] Agency: [facility] (formerly [employer]) Date [place]: 02/02/20 Time [place]: 1703 Representative spoke with at [place]: [place] (SDOH) Interventions Food Insecurity Interventions: Intervention Not Indicated Transportation Interventions: [employer], Other (Comment) (sometimes his insurance provides transportation)  Readmission Risk Interventions Readmission Risk Prevention Plan 02/02/2020 08/28/2019 08/08/2019  Transportation Screening Complete Complete Complete  HRI or [place] - - Complete  Social Work Consult for [place] Planning/Counseling - - Complete  Medication Review Press photographer) Complete Complete Complete  HRI or Home Care Consult Complete - -  SW Recovery Care/Counseling  Consult Complete Complete -  Palliative Care Screening Not Applicable Not Applicable -  [place] Not Applicable Not Applicable -

## 2020-02-08 NOTE — Progress Notes (Signed)
Orthopedic Tech Progress Note Patient Details:  Charles Gray 18-Oct-1955 [id number]  Ortho Devices Type of Ortho Device: Haematologist Ortho Device/Splint Location: ble Ortho Device/Splint Interventions: Ordered, Application   Post Interventions Patient Tolerated: Well Instructions Provided: Care of device, [place] ambulation with device   [name] 02/08/2020, 7:45 PM

## 2020-02-08 NOTE — Progress Notes (Signed)
PROGRESS NOTE  Charles Gray NTZ:[id number] DOB: 09/08/1955   PCP: Enid Skeens., MD  Patient is from: Home  DOA: 01/26/2020 LOS: 22  Chief complaints: Leg swelling  Brief Narrative / Interim history: 64 year old man with history of paroxysmal A. fib on Eliquis, probable chronic right heart failure, hypertension, possible cirrhosis, OSA and PE/DVT in 05/2019 direct up to ED by his cardiologist for refractory bilateral lower extremity edema and weight gain.   In ED, hemodynamically stable.  95% on room air.  CXR with bibasilar opacities.  Basic labs without significant finding other than thrombocytopenia to 130.  Started on IV Lasix and admitted for fluid overload/bilateral lower extremity edema and concern for acute on chronic right heart failure.  The next day, cardiology consulted, and started on IV Lasix and metolazone.  ABI was normal.  Unna boot applied.  Currently on Bumex, metolazone, Aldactone and potassium supplementation.  Subjective: Seen and examined earlier this morning.  No major events overnight of this morning.  He is very anxious about his potassium if he is released home.  Denies chest pain or dyspnea.  He still feels he has swelling in his legs although difficult to assess with Ace wrap.  Denies GI or UTI symptoms.  Objective: Vitals:   02/07/20 2319 02/08/20 0339 02/08/20 0340 02/08/20 0721  BP: 102/75  117/81 132/74  Pulse: 66  62 72  Resp: 14  12 17   Temp: 98 F (36.7 C)  98 F (36.7 C) 97.6 F (36.4 C)  TempSrc: Oral  Oral Oral  SpO2: 98%  98%   Weight:  (!) 165.2 kg    Height:        Intake/Output Summary (Last 24 hours) at 02/08/2020 1031 Last data filed at 02/08/2020 0851 Gross per 24 hour  Intake 1040 ml  Output 4800 ml  Net -3760 ml   Filed Weights   02/06/20 0500 02/07/20 0311 02/08/20 0339  Weight: (!) 164.6 kg (!) 164.5 kg (!) 165.2 kg    Examination:  GENERAL: No apparent distress.  Nontoxic. HEENT: MMM.  Vision and hearing  grossly intact.  NECK: Supple.  No apparent JVD.  RESP: On room air.  No IWOB.  Fair aeration bilaterally. CVS:  RRR. Heart sounds normal.  ABD/GI/GU: BS+. Abd soft, NTND.  MSK/EXT:  Moves extremities.  Ace wrap over both legs. SKIN: Ace wrap over both legs. NEURO: Awake, alert and oriented appropriately.  No apparent focal neuro deficit. PSYCH: Calm. Normal affect. Procedures:  None  Microbiology summarized: COVID-19 PCR negative. Influenza PCR negative.  Assessment & Plan: Acute on chronic diastolic CHF/fluid overload/bilateral lower extremity edema:   TTE in 07/2019 with EF of 55 to 60%, G1-DD. RVSF was not well-visualized. RHC at the same time reassuring.  BNP within normal but unreliable given obesity.  Diuresing well with Bumex, metolazone and Aldactone.  About 4.8 L UOP/24 hours.  Net -23 L so far.  Hypokalemia and renal function improved. -Continue Bumex, Aldactone and metolazone per cardiology -Continue potassium supplementation.  Check potassium in the afternoon and tomorrow morning -Monitor fluid status, renal function and electrolytes. -Continue BLE wrapping.  Paroxysmal atrial fibrillation: Rate controlled and in sinus rhythm.  Was on Cardizem -Continue metoprolol and Eliquis.  OSA not compliant with CPAP -Refusing CPAP.  History of DVT/PE -Continue Eliquis  Essential hypertension: Normotensive -Continue metoprolol and diuretics as above.  Chronic pain syndrome: Stable -Continue home Suboxone and gabapentin  Thrombocytopenia: Resolved.  Refractory hypokalemia: K3.6.  Mg 2.1. -Continue Aldactone and potassium  supplementation as above.  Mild hyponatremia: Na 133.  Relatively stable.  Likely from Aldactone. -Continue monitoring  Class II obesity Body mass index is 38.08 kg/m.         DVT prophylaxis:   apixaban (ELIQUIS) tablet 5 mg    Code Status: Full code Family Communication: Patient and/or RN. Available if any question.  Status is:  Inpatient  Remains inpatient appropriate because:Persistent severe electrolyte disturbances and Inpatient level of care appropriate due to severity of illness   Dispo: The patient is from: Home              Anticipated d/c is to: Home              Anticipated d/c date is: 1 day or when cleared by cardiology              Patient currently is not medically stable to d/c.            Consultants:  Cardiology-following Nephrology   Sch Meds:  Scheduled Meds: . apixaban  5 mg Oral BID  . bumetanide  4 mg Oral BID  . buprenorphine-naloxone  1 tablet Sublingual BID  . gabapentin  800 mg Oral TID  . metolazone  2.5 mg Oral Q M,W,F  . metoprolol tartrate  50 mg Oral BID  . pantoprazole  40 mg Oral BID  . potassium chloride  60 mEq Oral TID AC & HS  . pravastatin  20 mg Oral q1800  . sodium chloride flush  3 mL Intravenous Q12H  . spironolactone  100 mg Oral Daily   Continuous Infusions: . sodium chloride 20 mL/hr at 02/05/20 0430   PRN Meds:.sodium chloride, acetaminophen, ondansetron (ZOFRAN) IV, polyethylene glycol, senna-docusate, sodium chloride flush, zolpidem  Antimicrobials: Anti-infectives (From admission, onward)   None       I have personally reviewed the following labs and images: CBC: Recent Labs  Lab 02/03/20 0140 02/04/20 0142 02/05/20 0301 02/07/20 0033 02/08/20 0044  WBC 9.8 9.5 8.9 11.3* 9.4  HGB 13.5 13.4 13.0 13.5 13.0  HCT 43.8 43.3 42.1 42.6 41.2  MCV 80.4 79.6* 78.8* 79.2* 79.7*  PLT 192 192 161 191 175   BMP &GFR Recent Labs  Lab 02/04/20 1221 02/04/20 1221 02/05/20 0301 02/05/20 1522 02/06/20 0049 02/07/20 0033 02/07/20 0658 02/08/20 0044  NA 133*   < > 135 133* 132* 135  --  133*  K 3.1*   < > 2.4* 2.8* 3.1* 3.1*  --  3.6  CL 88*   < > 88* 89* 88* 91*  --  92*  CO2 30   < > 34* 31 29 31   --  28  GLUCOSE 142*   < > 121* 134* 132* 123*  --  117*  BUN 34*   < > 36* 35* 38* 37*  --  36*  CREATININE 1.32*   < > 1.29* 1.36*  1.38* 1.33*  --  1.22  CALCIUM 9.9   < > 9.6 9.7 9.6 9.7  --  9.5  MG 2.2  --   --   --   --   --  2.2 2.1  PHOS  --   --   --   --   --   --   --  3.3   < > = values in this interval not displayed.   Estimated Creatinine Clearance: 109.4 mL/min (by C-G formula based on SCr of 1.22 mg/dL). Liver & Pancreas: Recent Labs  Lab 02/08/20 0044  ALBUMIN 3.7   No results for input(s): LIPASE, AMYLASE in the last 168 hours. No results for input(s): AMMONIA in the last 168 hours. Diabetic: No results for input(s): HGBA1C in the last 72 hours. No results for input(s): GLUCAP in the last 168 hours. Cardiac Enzymes: No results for input(s): CKTOTAL, CKMB, CKMBINDEX, TROPONINI in the last 168 hours. Recent Labs    02/27/19 1106  PROBNP 37   Coagulation Profile: No results for input(s): INR, PROTIME in the last 168 hours. Thyroid Function Tests: No results for input(s): TSH, T4TOTAL, FREET4, T3FREE, THYROIDAB in the last 72 hours. Lipid Profile: No results for input(s): CHOL, HDL, LDLCALC, TRIG, CHOLHDL, LDLDIRECT in the last 72 hours. Anemia Panel: No results for input(s): VITAMINB12, FOLATE, FERRITIN, TIBC, IRON, RETICCTPCT in the last 72 hours. Urine analysis:    Component Value Date/Time   COLORURINE YELLOW 01/31/2020 [place] 01/31/2020 1505   LABSPEC 1.009 01/31/2020 1505   PHURINE 7.0 01/31/2020 1505   GLUCOSEU NEGATIVE 01/31/2020 1505   HGBUR NEGATIVE 01/31/2020 1505   BILIRUBINUR NEGATIVE 01/31/2020 1505   KETONESUR NEGATIVE 01/31/2020 1505   PROTEINUR NEGATIVE 01/31/2020 1505   NITRITE NEGATIVE 01/31/2020 [place] 01/31/2020 1505   Sepsis Labs: Invalid input(s): PROCALCITONIN, [place]  Microbiology: No results found for this or any previous visit (from the past 240 hour(s)).  Radiology Studies: No results found.   Jahnai [place] T. [place]  If 7PM-7AM, please contact night-coverage [email or web address] 02/08/2020, 10:31  AM

## 2020-02-08 NOTE — Progress Notes (Signed)
Physical Therapy Treatment Patient Details Name: Charles Gray MRN: [id number] DOB: [birth year] Today's Date: 02/08/2020    History of Present Illness Pt adm with acute on chronic rt heart failure. PMH - chf, htn, osa, PE/DVT, afib, obesity, chronic pain,     PT Comments    Pt seated in recliner on arrival. He required encouragement to gt train this pm.  Relied heavily on RW this session.  Noted with R knee buckling in stance phase.  Plan for HHPT at d/c.     Follow Up Recommendations  Home health PT     Equipment Recommendations  Other (comment) (rollator-wide/tall)    Recommendations for Other Services       Precautions / Restrictions Precautions Precautions: Fall Restrictions Weight Bearing Restrictions: No    Mobility  Bed Mobility               General bed mobility comments: Pt up in chair  Transfers   Equipment used: Rolling walker (2 wheeled) Transfers: Sit to/from Stand Sit to Stand: Supervision         General transfer comment: Incr time and effort from low chair  Ambulation/Gait Ambulation/Gait assistance: Min guard;Min assist Gait Distance (Feet): 80 Feet Assistive device: Rolling walker (2 wheeled) Gait Pattern/deviations: Step-through pattern;Decreased stride length;Trunk flexed;Wide base of support Gait velocity: decr   General Gait Details: Pt utilized RW for gt and was heavily reliant on device for support.  He experienced R knee pain with noted buckling.  Pt able to self correct with use of RW.  Cues for UE use to offset weight on R side due to pain.   Stairs             Wheelchair Mobility    Modified Rankin (Stroke Patients Only)       Balance Overall balance assessment: Needs assistance   Sitting balance-Leahy Scale: Good       Standing balance-Leahy Scale: Poor Standing balance comment: poor due to R knee buckling                            Cognition Arousal/Alertness: Awake/alert Behavior During  Therapy: WFL for tasks assessed/performed Overall Cognitive Status: Within Functional Limits for tasks assessed                                        Exercises      General Comments        Pertinent Vitals/Pain Pain Assessment: 0-10 Pain Score: 8  Pain Location: back and R knee. Pain Descriptors / Indicators: Grimacing;Tightness;Aching;Sore Pain Intervention(s): Monitored during session;Limited activity within patient's tolerance    Home Living                      Prior Function            PT Goals (current goals can now be found in the care plan section) Acute Rehab PT Goals Patient Stated Goal: return home Potential to Achieve Goals: Good Progress towards PT goals: Progressing toward goals    Frequency    Min 3X/week      PT Plan Current plan remains appropriate    Co-evaluation              AM-PAC PT "6 Clicks" Mobility   Outcome Measure  Help needed turning from your back to your side while  in a flat bed without using bedrails?: None Help needed moving from lying on your back to sitting on the side of a flat bed without using bedrails?: None Help needed moving to and from a bed to a chair (including a wheelchair)?: A Little Help needed standing up from a chair using your arms (e.g., wheelchair or bedside chair)?: A Little Help needed to walk in hospital room?: A Little Help needed climbing 3-5 steps with a railing? : A Little 6 Click Score: 20    End of Session Equipment Utilized During Treatment: Gait belt Activity Tolerance: Patient limited by fatigue Patient left: in chair;with call bell/phone within reach Nurse Communication: Mobility status (informed nurse of knee buckling.) PT Visit Diagnosis: Other abnormalities of gait and mobility (R26.89);Muscle weakness (generalized) (M62.81)     Time: 8288-3374 PT Time Calculation (min) (ACUTE ONLY): 11 min  Charges:  $Gait Training: 8-22 mins                      Charles Gray , PTA Acute Rehabilitation Services Pager [phone] Office [phone]     Charles Gray Charles Gray 02/08/2020, 5:03 PM

## 2020-02-09 ENCOUNTER — Other Ambulatory Visit (HOSPITAL_COMMUNITY): Payer: Self-pay | Admitting: *Deleted

## 2020-02-09 ENCOUNTER — Telehealth (HOSPITAL_COMMUNITY): Payer: Self-pay | Admitting: Pharmacist

## 2020-02-09 ENCOUNTER — Telehealth: Payer: Self-pay | Admitting: Cardiology

## 2020-02-09 DIAGNOSIS — G4733 Obstructive sleep apnea (adult) (pediatric): Secondary | ICD-10-CM | POA: Diagnosis not present

## 2020-02-09 DIAGNOSIS — I5032 Chronic diastolic (congestive) heart failure: Secondary | ICD-10-CM

## 2020-02-09 DIAGNOSIS — I480 Paroxysmal atrial fibrillation: Secondary | ICD-10-CM | POA: Diagnosis not present

## 2020-02-09 DIAGNOSIS — Z86718 Personal history of other venous thrombosis and embolism: Secondary | ICD-10-CM

## 2020-02-09 DIAGNOSIS — I50813 Acute on chronic right heart failure: Secondary | ICD-10-CM | POA: Diagnosis not present

## 2020-02-09 DIAGNOSIS — I10 Essential (primary) hypertension: Secondary | ICD-10-CM | POA: Diagnosis not present

## 2020-02-09 LAB — RENAL FUNCTION PANEL
Albumin: 3.9 g/dL (ref 3.5–5.0)
Anion gap: 13 (ref 5–15)
BUN: 29 mg/dL — ABNORMAL HIGH (ref 8–23)
CO2: 30 mmol/L (ref 22–32)
Calcium: 9.5 mg/dL (ref 8.9–10.3)
Chloride: 93 mmol/L — ABNORMAL LOW (ref 98–111)
Creatinine, Ser: 1.17 mg/dL (ref 0.61–1.24)
GFR, Estimated: >60 mL/min (ref >60)
Glucose, Bld: 108 mg/dL — ABNORMAL HIGH (ref 70–99)
Phosphorus: 3.4 mg/dL (ref 2.5–4.6)
Potassium: 3.2 mmol/L — ABNORMAL LOW (ref 3.5–5.1)
Sodium: 136 mmol/L (ref 135–145)

## 2020-02-09 LAB — MAGNESIUM: Magnesium: 2 mg/dL (ref 1.7–2.4)

## 2020-02-09 MED ORDER — BUMETANIDE 1 MG PO TABS
3.0000 mg | ORAL_TABLET | Freq: Two times a day (BID) | ORAL | 0 refills | Status: DC
Start: 2020-02-09 — End: 2020-03-04

## 2020-02-09 MED ORDER — BUMETANIDE 2 MG PO TABS
4.0000 mg | ORAL_TABLET | Freq: Two times a day (BID) | ORAL | 0 refills | Status: DC
Start: 2020-02-09 — End: 2020-02-09

## 2020-02-09 MED ORDER — METOPROLOL TARTRATE 50 MG PO TABS
50.0000 mg | ORAL_TABLET | Freq: Two times a day (BID) | ORAL | 1 refills | Status: DC
Start: 2020-02-09 — End: 2020-04-11

## 2020-02-09 MED ORDER — POTASSIUM CHLORIDE CRYS ER 20 MEQ PO TBCR
40.0000 meq | EXTENDED_RELEASE_TABLET | Freq: Once | ORAL | Status: AC
  Administered 2020-02-09: 40 meq via ORAL
  Filled 2020-02-09: qty 2

## 2020-02-09 MED ORDER — SPIRONOLACTONE 100 MG PO TABS
100.0000 mg | ORAL_TABLET | Freq: Every day | ORAL | 0 refills | Status: DC
Start: 2020-02-10 — End: 2020-02-27

## 2020-02-09 MED ORDER — METOPROLOL TARTRATE 50 MG PO TABS
50.0000 mg | ORAL_TABLET | Freq: Two times a day (BID) | ORAL | 1 refills | Status: DC
Start: 2020-02-09 — End: 2020-02-09

## 2020-02-09 MED ORDER — POTASSIUM CHLORIDE CRYS ER 20 MEQ PO TBCR
60.0000 meq | EXTENDED_RELEASE_TABLET | Freq: Three times a day (TID) | ORAL | 0 refills | Status: DC
Start: 2020-02-09 — End: 2020-02-22

## 2020-02-09 MED ORDER — POTASSIUM CHLORIDE CRYS ER 20 MEQ PO TBCR
60.0000 meq | EXTENDED_RELEASE_TABLET | Freq: Three times a day (TID) | ORAL | 0 refills | Status: DC
Start: 2020-02-09 — End: 2020-02-09

## 2020-02-09 MED ORDER — SPIRONOLACTONE 100 MG PO TABS
100.0000 mg | ORAL_TABLET | Freq: Every day | ORAL | 0 refills | Status: DC
Start: 2020-02-10 — End: 2020-02-09

## 2020-02-09 MED FILL — METOPROLOL TARTRATE 50 MG T: 50 | 90 days supply | Qty: 180 | Fill #0

## 2020-02-09 MED FILL — SPIRONOLACTONE 100 MG TABS: 100 | 30 days supply | Qty: 30 | Fill #0

## 2020-02-09 MED FILL — POTASSIUM CHLORIDE 20meqER: 20 | 20 days supply | Qty: 180 | Fill #0

## 2020-02-09 MED FILL — BUMETANIDE 1 MG TABS: 1 | 30 days supply | Qty: 180 | Fill #0

## 2020-02-09 NOTE — Progress Notes (Signed)
Ortho tech called d/t pain in right foot r/t unna boots reapplied 10/28. Dr. Debara Pickett at bedside. Ortho tech stated to cut off affected device and ortho tech will be by today to reapply.  Pt states some relief after removal of R unna boot. Toes 2-4 red.

## 2020-02-09 NOTE — Discharge Summary (Signed)
Physician Discharge Summary  Charles Gray LGX:[id number] DOB: Mar 11, 1956 DOA: 01/26/2020  PCP: Enid Skeens., MD  Admit date: 01/26/2020 Discharge date: 02/09/2020  Admitted From: Home Disposition: Home  Recommendations for Outpatient Follow-up:  1. Follow ups as below. 2. Please obtain CBC/BMP/Mag at follow up 3. Please follow up on the following pending results: None  Home Health: PT/OT/RN Equipment/Devices: Rollator  Discharge Condition: Stable CODE STATUS: Full code   Follow-up Information    [facility], [facility].   Why: wide rollator Contact information: [address] [place] [address] [phone]        Enid Skeens., MD. Schedule an appointment as soon as possible for a visit in 1 week(s).   Specialty: Family Medicine Contact information: [address] [place] [address] [phone]        Berniece Salines, DO. Schedule an appointment as soon as possible for a visit in 4 day(s).   Specialty: Cardiology Contact information: [place] [address] [phone]               Hospital Course: 64 year old man with history of paroxysmal A. fib on Eliquis, probable chronic right heart failure, hypertension, possible cirrhosis, OSA and PE/DVT in 05/2019 direct up to ED by his cardiologist for refractory bilateral lower extremity edema and weight gain.   In ED, hemodynamically stable.  95% on room air.  CXR with bibasilar opacities.  Basic labs without significant finding other than thrombocytopenia to 130.  Started on IV Lasix and admitted for fluid overload/bilateral lower extremity edema and concern for acute on chronic right heart failure.  The next day, cardiology consulted, and started on IV Lasix and metolazone.  ABI was normal.  Unna boot applied.  Initially diuresed with IV Lasix and transitioned to Bumex, metolazone and aldactone.  He had refractory hypokalemia that has improved with adjustment to his diuretics  by increasing Aldactone.  He diuresed about 27 L based on what was charted.  Weight down from 375pounds to 360 pounds.  Patient was cleared for discharge by cardiology on Bumex 3 mg twice daily, spironolactone 100 mg daily and K-Dur 40 mEq 4 times a day.  Patient to follow-up with his PCP and cardiologist next week.  He was counseled on sodium and fluid restriction and leg elevation.  Home health PT/OT/RN ordered.  See individual problem list below for more on hospital course.  Discharge Diagnoses:  Acute on chronic diastolic CHF/fluid overload/bilateral lower extremity edema:   TTE in 07/2019 with EF of 55 to 60%, G1-DD. RVSF was not well-visualized. RHC at the same time reassuring.  BNP within normal but unreliable given obesity.  Diuresis with IV and p.o. diuretics -Discharged on Bumex, Aldactone and potassium as above -Continue Unna boot.  Dietary counseling given  Paroxysmal atrial fibrillation: Rate controlled and in sinus rhythm.  -Continue metoprolol and Eliquis.  OSA not compliant with CPAP  History of DVT/PE -Continue Eliquis  Essential hypertension: Normotensive -Continue metoprolol and diuretics as above.  Chronic pain syndrome/neuropathy: Stable -Continue home Suboxone and gabapentin  Thrombocytopenia: Resolved.  Refractory hypokalemia:  K3.2. -Reduced Bumex to 3 mg twice daily after discussion with cardiology -Aldactone and potassium supplementation as above. -Recheck in 3 to 4 days.  Mild hyponatremia: Resolved.  Class II obesity Body mass index is 37.71 kg/m.            Discharge Exam: Vitals:   02/09/20 0438 02/09/20 0710  BP: 114/76 108/64  Pulse: 63 60  Resp:  20 19  Temp: 98.6 F (37 C) 97.6 F (36.4 C)  SpO2: 96% 98%    GENERAL: No apparent distress.  Nontoxic. HEENT: MMM.  Vision and hearing grossly intact.  NECK: Supple.  No apparent JVD.  RESP: On room air.  No IWOB.  Fair aeration bilaterally. CVS:  RRR. Heart sounds normal.   ABD/GI/GU: Bowel sounds present. Soft. Non tender.  MSK/EXT:  Moves extremities.  Trace to 1+ edema in RLE.  Unna boot over LLE. SKIN: no apparent skin lesion or wound NEURO: Awake, alert and oriented appropriately.  No apparent focal neuro deficit. PSYCH: Calm. Normal affect.   Discharge Instructions  Discharge Instructions    (HEART FAILURE PATIENTS) Call MD:  Anytime you have any of the following symptoms: 1) 3 pound weight gain in 24 hours or 5 pounds in 1 week 2) shortness of breath, with or without a dry hacking cough 3) swelling in the hands, feet or stomach 4) if you have to sleep on extra pillows at night in order to breathe.   Complete by: As directed    Call MD for:  difficulty breathing, headache or visual disturbances   Complete by: As directed    Call MD for:  extreme fatigue   Complete by: As directed    Call MD for:  persistant dizziness or light-headedness   Complete by: As directed    Diet - low sodium heart healthy   Complete by: As directed    Discharge instructions   Complete by: As directed    It has been a pleasure taking care of you!  You were hospitalized heart failure and leg swelling.  You were treated with fluid medication.  Your symptoms improved.  We are discharging you on new fluid medication and potassium.  Please review your new medication list and the directions on the medication before you take them.  Follow-up with your cardiologist early next week for blood work.  In addition to taking your medications, your medications as prescribed, avoid alcohol or over-the-counter pain medication other than plain Tylenol, limit the amount of water/fluid you drink to less than 6 cups (1500 cc) a day,  limit your sodium (salt) intake to less than 2 g (2000 mg) a day and weigh yourself daily at the same time and keeping your weight log.  We also recommend elevating your legs. A nurse will come to your home to change the unna boot twice a week.        Please go  to your hospital follow-up appointments or call to reschedule as recommended.   Take care,   Increase activity slowly   Complete by: As directed      Allergies as of 02/09/2020      Reactions   Penicillins    Did it involve swelling of the face/tongue/throat, SOB, or low BP? N/A Did it involve sudden or severe rash/hives, skin peeling, or any reaction on the inside of your mouth or nose? N/A Did you need to seek medical attention at a hospital or doctor's office?N/A When did it last happen?N/A If all above answers are "NO", may proceed with cephalosporin use.      Medication List    STOP taking these medications   diphenhydrAMINE 25 MG tablet Commonly known as: BENADRYL   torsemide 20 MG tablet Commonly known as: DEMADEX     TAKE these medications   acetaminophen 325 MG tablet Commonly known as: TYLENOL Take 2 tablets (650 mg total) by mouth every 4 (  four) hours as needed for fever or mild pain.   apixaban 5 MG Tabs tablet Commonly known as: Eliquis Take 1 tablet (5 mg total) by mouth 2 (two) times daily.   bumetanide 2 MG tablet Commonly known as: BUMEX Take 2 tablets (4 mg total) by mouth 2 (two) times daily.   Buprenorphine HCl-Naloxone HCl 8-2 MG Film Place 1 tablet under the tongue in the morning and at bedtime.   gabapentin 800 MG tablet Commonly known as: NEURONTIN Take 1 tablet (800 mg total) by mouth 3 (three) times daily.   metoprolol tartrate 50 MG tablet Commonly known as: LOPRESSOR Take 1 tablet (50 mg total) by mouth 2 (two) times daily.   pantoprazole 40 MG tablet Commonly known as: PROTONIX Take 40 mg by mouth 2 (two) times daily.   polyethylene glycol 17 g packet Commonly known as: MIRALAX / GLYCOLAX Take 17 g by mouth daily. What changed:   when to take this  reasons to take this   potassium chloride SA 20 MEQ tablet Commonly known as: KLOR-CON Take 3 tablets (60 mEq total) by mouth 4 (four) times daily -  before meals and at  bedtime.   pravastatin 20 MG tablet Commonly known as: PRAVACHOL Take 1 tablet (20 mg total) by mouth daily.   spironolactone 100 MG tablet Commonly known as: ALDACTONE Take 1 tablet (100 mg total) by mouth daily. Start taking on: February 10, 2020 What changed:   medication strength  how much to take   Ventolin HFA 108 (90 Base) MCG/ACT inhaler Generic drug: albuterol Inhale 1 puff into the lungs every 4 (four) hours as needed for shortness of breath.   zolpidem 10 MG tablet Commonly known as: AMBIEN Take 10 mg by mouth at bedtime as needed for sleep.            Durable Medical Equipment  (From admission, onward)         Start     Ordered   02/02/20 1645  For home use only DME 4 wheeled rolling walker with seat  Once       Comments: HD  Question:  Patient needs a walker to treat with the following condition  Answer:  Weakness   02/02/20 1645          Consultations:  Cardiology  Nephrology  Procedures/Studies:   DG Chest 2 View  Result Date: 01/26/2020 CLINICAL DATA:  Shortness of breath EXAM: CHEST - 2 VIEW COMPARISON:  09/25/2019 chest radiograph and prior. FINDINGS: Bibasilar opacities, likely atelectasis. No pneumothorax or pleural effusion. Cardiomediastinal silhouette within normal limits. No acute osseous abnormality. Mild multilevel spondylosis. IMPRESSION: Bibasilar opacities, likely atelectasis. Electronically Signed   By: Primitivo Gauze M.D.   On: 01/26/2020 16:41   VAS [place] ABI WITH/WO TBI  Result Date: 01/30/2020 LOWER EXTREMITY DOPPLER STUDY Indications: Diminished pulses. High Risk Factors: Hypertension.  Comparison Study: No prior studies. Performing Technologist: Carlos Levering Rvt  Examination Guidelines: A complete evaluation includes at minimum, Doppler waveform signals and systolic blood pressure reading at the level of bilateral brachial, anterior tibial, and posterior tibial arteries, when vessel segments are accessible. Bilateral  testing is considered an integral part of a complete examination. Photoelectric Plethysmograph (PPG) waveforms and toe systolic pressure readings are included as required and additional duplex testing as needed. Limited examinations for reoccurring indications may be performed as noted.  ABI Findings: +--------+------------------+-----+---------+--------+ Right   Rt Pressure (mmHg)IndexWaveform Comment  +--------+------------------+-----+---------+--------+ [id number]  triphasic         +--------+------------------+-----+---------+--------+ PTA     161               1.10 triphasic         +--------+------------------+-----+---------+--------+ DP      149               1.02 biphasic          +--------+------------------+-----+---------+--------+ +--------+------------------+-----+---------+-------+ Left    Lt Pressure (mmHg)IndexWaveform Comment +--------+------------------+-----+---------+-------+ [id number]                    triphasic        +--------+------------------+-----+---------+-------+ PTA     188               1.29 triphasic        +--------+------------------+-----+---------+-------+ DP      149               1.02 biphasic         +--------+------------------+-----+---------+-------+  Summary: Right: Resting right ankle-brachial index is within normal range. No evidence of significant right lower extremity arterial disease. Left: Resting left ankle-brachial index is within normal range. No evidence of significant left lower extremity arterial disease.  *See table(s) above for measurements and observations.  Electronically signed by Curt Jews MD on 01/30/2020 at 4:23:44 PM.   Final         The results of significant diagnostics from this hospitalization (including imaging, microbiology, ancillary and laboratory) are listed below for reference.     Microbiology: No results found for this or any previous visit (from the past  240 hour(s)).   Labs: BNP (last 3 results) Recent Labs    11/07/19 1244 12/23/19 0638 01/27/20 1215  BNP 20.8 295.7* 39.7   Basic Metabolic Panel: Recent Labs  Lab 02/04/20 1221 02/05/20 0301 02/05/20 1522 02/05/20 1522 02/06/20 0049 02/07/20 0033 02/07/20 0658 02/08/20 0044 02/08/20 1727 02/09/20 0048  NA 133*   < > 133*  --  132* 135  --  133*  --  136  K 3.1*   < > 2.8*   < > 3.1* 3.1*  --  3.6 4.0 3.2*  CL 88*   < > 89*  --  88* 91*  --  92*  --  93*  CO2 30   < > 31  --  29 31  --  28  --  30  GLUCOSE 142*   < > 134*  --  132* 123*  --  117*  --  108*  BUN 34*   < > 35*  --  38* 37*  --  36*  --  29*  CREATININE 1.32*   < > 1.36*  --  1.38* 1.33*  --  1.22  --  1.17  CALCIUM 9.9   < > 9.7  --  9.6 9.7  --  9.5  --  9.5  MG 2.2  --   --   --   --   --  2.2 2.1  --  2.0  PHOS  --   --   --   --   --   --   --  3.3  --  3.4   < > = values in this interval not displayed.   Liver Function Tests: Recent Labs  Lab 02/08/20 0044 02/09/20 0048  ALBUMIN 3.7 3.9   No results for input(s): LIPASE, AMYLASE in the last 168 hours. No results for input(s): AMMONIA  in the last 168 hours. CBC: Recent Labs  Lab 02/03/20 0140 02/04/20 0142 02/05/20 0301 02/07/20 0033 02/08/20 0044  WBC 9.8 9.5 8.9 11.3* 9.4  HGB 13.5 13.4 13.0 13.5 13.0  HCT 43.8 43.3 42.1 42.6 41.2  MCV 80.4 79.6* 78.8* 79.2* 79.7*  PLT 192 192 161 191 175   Cardiac Enzymes: No results for input(s): CKTOTAL, CKMB, CKMBINDEX, TROPONINI in the last 168 hours. BNP: Invalid input(s): POCBNP CBG: No results for input(s): GLUCAP in the last 168 hours. D-Dimer No results for input(s): DDIMER in the last 72 hours. Hgb A1c No results for input(s): HGBA1C in the last 72 hours. Lipid Profile No results for input(s): CHOL, HDL, LDLCALC, TRIG, CHOLHDL, LDLDIRECT in the last 72 hours. Thyroid function studies No results for input(s): TSH, T4TOTAL, T3FREE, THYROIDAB in the last 72 hours.  Invalid  input(s): FREET3 Anemia work up No results for input(s): VITAMINB12, FOLATE, FERRITIN, TIBC, IRON, RETICCTPCT in the last 72 hours. Urinalysis    Component Value Date/Time   COLORURINE YELLOW 01/31/2020 1505   APPEARANCEUR CLEAR 01/31/2020 1505   LABSPEC 1.009 01/31/2020 1505   PHURINE 7.0 01/31/2020 1505   GLUCOSEU NEGATIVE 01/31/2020 1505   HGBUR NEGATIVE 01/31/2020 [place] 01/31/2020 1505   [place] 01/31/2020 1505   PROTEINUR NEGATIVE 01/31/2020 1505   NITRITE NEGATIVE 01/31/2020 1505   LEUKOCYTESUR NEGATIVE 01/31/2020 1505   Sepsis Labs Invalid input(s): PROCALCITONIN,  WBC,  LACTICIDVEN   Time coordinating discharge: 40 minutes  SIGNED:  Mercy Riding, MD  [facility] 02/09/2020, 9:58 AM  If 7PM-7AM, please contact night-coverage [email or web address]

## 2020-02-09 NOTE — TOC Transition Note (Signed)
Transition of Care [facility]) - CM/SW Discharge Note   Patient Details  Name: Charles Gray MRN: [id number] Date of Birth: 1956-01-14  Transition of [facility] CM/SW Contact:  Zenon Mayo, RN Phone Number: 02/09/2020, 1:06 PM   Clinical Narrative:    Patient is for dc today, Teresa  With [facility] notified of dc.   Final next level of care: [place] Barriers to Discharge: No Barriers Identified   Patient Goals and CMS Choice Patient states their goals for this hospitalization and ongoing recovery are:: get better CMS Medicare.gov Compare Post Acute Care list provided to:: Patient Choice offered to / list presented to : Patient  Discharge Placement                       Discharge Plan and Services   Discharge Planning Services: CM Consult Post Acute Care Choice: Home Health, Durable Medical Equipment          DME Arranged: Walker rolling with seat DME Agency: [employer] Date DME Agency Contacted: 02/02/20 Time DME Agency Contacted: [phone] Representative spoke with at DME Agency: [place]: RN, Nurse's Aide [place] Agency: [facility] (formerly [employer]) Date [place]: 02/02/20 Time [place]: 1703 Representative spoke with at [place]: [place] (SDOH) Interventions Food Insecurity Interventions: Intervention Not Indicated Transportation Interventions: [employer], Other (Comment) (sometimes his insurance provides transportation)   Readmission Risk Interventions Readmission Risk Prevention Plan 02/02/2020 08/28/2019 08/08/2019  Transportation Screening Complete Complete Complete  HRI or [place] - - Complete  Social Work Consult for [place] Planning/Counseling - - Complete  Medication Review Press photographer) Complete Complete Complete  HRI or Home Care Consult Complete - -  SW Recovery Care/Counseling Consult Complete Complete -   Palliative Care Screening Not Applicable Not Applicable -  [place] Not Applicable Not Applicable -

## 2020-02-09 NOTE — Progress Notes (Signed)
Discharge instructions given to patient and son. Questions answered. Thoroughly educated for 1.5 hours on new medication regimen, signs/symptoms, restrictions, and follow up appointments. Prescriptions brought to bedside by [facility] pharmacy. This RN spoke with HF SW in regards to paramedicine program and getting follow up appointment with HF [name]). This RN utilized pill Chief Executive Officer from HF clinic, filled week worth of meds available from [facility], pt and son understood the need to fill box with the other home medications not available at time of DC. This RN made all follow up appointments for patient. Post Op shoe to R foot for painful toes when ambulating, pt unable to put shoe on foot. Vital signs stable. All belongings sent home with patient.   Pt escorted by RN via wheelchair to private vehicle driven by son.

## 2020-02-09 NOTE — Telephone Encounter (Signed)
Advanced Heart Failure Patient Advocate Encounter  Prior Authorization for Potassium has been approved.    Effective dates: 02/09/20 through 04/12/2021  Audry Riles, PharmD, BCPS, BCCP, CPP Heart Failure Clinic Pharmacist [phone]

## 2020-02-09 NOTE — Telephone Encounter (Signed)
Charles Gray from [facility] is calling stating Charles Gray is being released from the hospital and she wanted to make Dr. Harriet Masson aware that they have changed his diuretic medication as well as his potasium. She states he is very anxious and has a lot of anxiety about going home with new medications even though he has been highly educated during his stay in regards to it. She requested the first available with Dr. Harriet Masson which is in [place], but the pt's states he is not familiar with that location so he has been scheduled for the first available in [place] for 11/16.

## 2020-02-09 NOTE — Telephone Encounter (Signed)
Patient Advocate Encounter   Received notification from [facility] that prior authorization for potassium chloride is required.   PA submitted on CoverMyMeds Key [id number]  Status is pending   Will continue to follow.   Audry Riles, PharmD, BCPS, BCCP, CPP Heart Failure Clinic Pharmacist [phone]

## 2020-02-09 NOTE — Progress Notes (Signed)
Orthopedic Tech Progress Note Patient Details:  [name] [birth year] [id number] Was called by RN this morning about patient needing UNNA BOOT reapplied to RLE. Once I got there patient stated "he was being discharged to day and did not want to reapply UNNA BOOTS because he wanted to take a shower once he got home and TOES on Right foot (2-4) was still reddish purple and very sore concern of them being "broken".  Patient ID: [name], male   DOB: Jan 28, 1956, 64 y.o.   MRN: [id number]   Janit Pagan 02/09/2020, 2:47 PM

## 2020-02-09 NOTE — Progress Notes (Addendum)
Progress Note   Subjective   Negative 4L overnight, despite holding on metolazone yesterday. Currently on Bumex 4 mg BID and aldactone 100 mg daily. Creatinine further improved today - potassium was up to 4, however is at 3.2 today.  Inpatient Medications    Scheduled Meds: . apixaban  5 mg Oral BID  . bumetanide  4 mg Oral BID  . buprenorphine-naloxone  1 tablet Sublingual BID  . gabapentin  800 mg Oral TID  . metolazone  2.5 mg Oral Q M,W,F  . metoprolol tartrate  50 mg Oral BID  . pantoprazole  40 mg Oral BID  . potassium chloride  60 mEq Oral TID AC & HS  . pravastatin  20 mg Oral q1800  . sodium chloride flush  3 mL Intravenous Q12H  . spironolactone  100 mg Oral Daily   Continuous Infusions: . sodium chloride 20 mL/hr at 02/05/20 0430   PRN Meds: sodium chloride, acetaminophen, ondansetron (ZOFRAN) IV, polyethylene glycol, senna-docusate, sodium chloride flush, zolpidem   Vital Signs    Vitals:   02/09/20 0000 02/09/20 0438 02/09/20 0506 02/09/20 0710  BP: 116/73 114/76  108/64  Pulse: 65 63  60  Resp: 13 20  19   Temp: 98.3 F (36.8 C) 98.6 F (37 C)  97.6 F (36.4 C)  TempSrc: Oral Oral  Axillary  SpO2: 95% 96%  98%  Weight:   (!) 163.6 kg   Height:        Intake/Output Summary (Last 24 hours) at 02/09/2020 0811 Last data filed at 02/09/2020 0411 Gross per 24 hour  Intake 780 ml  Output 5225 ml  Net -4445 ml   Filed Weights   02/07/20 0311 02/08/20 0339 02/09/20 0506  Weight: (!) 164.5 kg (!) 165.2 kg (!) 163.6 kg    Telemetry    Sinus rhythm - Personally Reviewed  Physical Exam   General appearance: alert, no distress and morbidly obese Neck: no carotid bruit, no JVD and thyroid not enlarged, symmetric, no tenderness/mass/nodules Lungs: diminished breath sounds bilaterally Heart: regular rate and rhythm Abdomen: soft, non-tender Extremities: edema 1+ edema - wrapped, right toes with rubor Pulses: 2+ and symmetric Skin: Skin color,  texture, turgor normal. No rashes or lesions Neurologic: Grossly normal Psych: Anxious  Labs    Chemistry Recent Labs  Lab 02/07/20 0033 02/07/20 0033 02/08/20 0044 02/08/20 1727 02/09/20 0048  NA 135  --  133*  --  136  K 3.1*   < > 3.6 4.0 3.2*  CL 91*  --  92*  --  93*  CO2 31  --  28  --  30  GLUCOSE 123*  --  117*  --  108*  BUN 37*  --  36*  --  29*  CREATININE 1.33*  --  1.22  --  1.17  CALCIUM 9.7  --  9.5  --  9.5  ALBUMIN  --   --  3.7  --  3.9  GFRNONAA 60*  --  >60  --  >60  ANIONGAP 13  --  13  --  13   < > = values in this interval not displayed.     Hematology Recent Labs  Lab 02/05/20 0301 02/07/20 0033 02/08/20 0044  WBC 8.9 11.3* 9.4  RBC 5.34 5.38 5.17  HGB 13.0 13.5 13.0  HCT 42.1 42.6 41.2  MCV 78.8* 79.2* 79.7*  MCH 24.3* 25.1* 25.1*  MCHC 30.9 31.7 31.6  RDW 15.6* 15.7* 15.6*  PLT 161 191  175     Patient ID   64 y.o. male w/ past medical history of chronic diastolic CHF, CAD (s/p prior Coronary CTA in 10/2019 showing mild plaque along RCA and LAD and FFR showing possible distal disease with medical therapy recommended), paroxysmal atrial fibrillation, HTN, HLD, COPD, cirrhosis and history of DVT/PE who is currently admitted for an acute CHF exacerbation.   Assessment & Plan    1.  Acute on chronic diastolic dysfunction Excellent diuresis without metolazone yesterday. Potassium overall has improved - suspect we are now seeing the effects of aldactone. Will d/c Bumex - likely not necessary at this point and will contribute to hypokalemia. May be able to decrease dose of Bumex as well given brisk diuresis.  Continue current therapy Strict Is and Os (output not recorded) Daily weights - stable  2 paroxysmal atrial fibrillation Maintaining sinus rhythm Continue eliquis for stroke prevention  3. HTN Stable No change required today   4. H/o PTE Continue eliquis  5. OSA Compliance with CPAP is advised  6. Hypokalemia Requiring  large doses of K to replace K+ 3.6 today, Magnesium 2.1 Appreciate nephrology recs - will d/c metolazone given brisk diuresis. Continue Bumex and aldactone.  Could likely be discharged today- will need close follow-up with primary cardiologist Dr. Harriet Masson in [place] in 7-10 days.  He is requesting home health to assist with LE wrapping. Says that his son can pick him up after 4 pm today. Will need right leg wrap adjusted before discharge due to rubor of the toes.  Pixie Casino, MD, [facility], [place] Director of the Advanced Lipid Disorders &  Cardiovascular Risk Reduction Clinic Diplomate of the American Board of Clinical Lipidology Attending Cardiologist  Direct Dial: [phone]  Fax: [phone]  Website:  [email or web address]  02/09/2020 8:11 AM

## 2020-02-14 ENCOUNTER — Telehealth (HOSPITAL_COMMUNITY): Payer: Self-pay | Admitting: Licensed Clinical Social Worker

## 2020-02-14 ENCOUNTER — Other Ambulatory Visit (HOSPITAL_COMMUNITY): Payer: Self-pay | Admitting: *Deleted

## 2020-02-14 NOTE — Telephone Encounter (Signed)
CSW consulted to follow up with pt regarding [employer] as well as transportation needs.  CSW attempted to call but went straight to VM- left message requesting return call  Will continue to follow and attempt to reach out  Jorge Ny, [facility] Desk#: [phone] Cell#: [phone]

## 2020-02-14 NOTE — Telephone Encounter (Signed)
Paramedicine Initial Assessment:  Housing:  In what kind of housing do you live? House/apt/trailer/shelter? apt  Do you rent/pay a mortgage/own? rent  Do you live with anyone? No one other than his cat  Are you currently worried about losing your housing? no  Within the past 12 months have you ever stayed outside, in a car, tent, a shelter, or temporarily with someone? no  Within the past 12 months have you been unable to get utilities when it was really needed? no  Social:  Do you have any children? Son who lives 4 miles but doesn't speak with him much.  Do you have family or friends who live locally? Has another son who lives in [place]:  Within the past 12 months were you ever worried that food would run out before you got money to buy more? no  Within the past 70month have you run out of food and didn't have money to buy more? No  Gets food stamps  Income:  What is your current source of income? Around $800  How hard is it for you to pay for the basics like food housing, medical care, and utilities? Not very hard  Do you have outstanding medical bills? no  Insurance:  Are you currently insured? Yes- [employer] Medicare and Medicaid- seems to be covering everything ok right now  Do you have prescription coverage? yes  If no insurance, have you applied for coverage (Medicaid, disability, marketplace etc)?  Transportation:  Do you have transportation to your medical appointments?  Has utilized [facility] transport in the past and set up his own ride for Friday.  In the past 12 months has lack of transportation kept you from medical appts or from getting medications? yes  In the past 12 months has lack of transportation kept you from meetings, work, or getting things you needed? no   Daily Health Needs: Do you have a working scale at home? yes  How do you manage your medications at home? Just got a pillbox through home health- [facility] and  then he has been managing it since initial visit.  Do you ever take your medications differently than prescribed? no  Do you have issues affording your medications? no  If yes, has this ever prevented you from obtaining medications? n/a  Do you have any concerns with mobility at home? Has some trouble cooking meals cause he can't stand up too long- has his PCP helping to fill out PCS form.  Do you use any assistive devices at home or have PCS at home? Has cane and walker  Do you have a PCP? Dr. JCecille Amsterdam next visit Monday.   Are there any additional barriers you see to getting the care you need? Not at this time  CSW will continue to follow through paramedicine program and assist as needed.  JJorge Ny LCSW Clinical Social Worker [facility] Desk#: [phone]#: [phone]

## 2020-02-16 ENCOUNTER — Ambulatory Visit (HOSPITAL_COMMUNITY)
Admit: 2020-02-16 | Discharge: 2020-02-16 | Disposition: A | Discharge status: Home / Self Care | Payer: Medicare HMO | Attending: Cardiology | Admitting: Cardiology

## 2020-02-16 ENCOUNTER — Telehealth (HOSPITAL_COMMUNITY): Payer: Self-pay | Admitting: *Deleted

## 2020-02-16 ENCOUNTER — Other Ambulatory Visit: Payer: Self-pay

## 2020-02-16 DIAGNOSIS — I5032 Chronic diastolic (congestive) heart failure: Secondary | ICD-10-CM | POA: Diagnosis present

## 2020-02-16 LAB — BASIC METABOLIC PANEL
Anion gap: 11 (ref 5–15)
BUN: 13 mg/dL (ref 8–23)
CO2: 28 mmol/L (ref 22–32)
Calcium: 9.5 mg/dL (ref 8.9–10.3)
Chloride: 99 mmol/L (ref 98–111)
Creatinine, Ser: 1.07 mg/dL (ref 0.61–1.24)
GFR, Estimated: >60 mL/min (ref >60)
Glucose, Bld: 103 mg/dL — ABNORMAL HIGH (ref 70–99)
Potassium: 4.4 mmol/L (ref 3.5–5.1)
Sodium: 138 mmol/L (ref 135–145)

## 2020-02-16 LAB — BRAIN NATRIURETIC PEPTIDE: B Natriuretic Peptide: 33.1 pg/mL (ref 0.0–100.0)

## 2020-02-16 NOTE — Telephone Encounter (Signed)
Pt aware and has f/u appt 11/16

## 2020-02-16 NOTE — Telephone Encounter (Signed)
Low sodium and fluid diet, make sure he has a followup appt with [place] soon.

## 2020-02-16 NOTE — Telephone Encounter (Signed)
Pt reported to Tammy Sours that his weight was up 8lbs from discharge and admitted to drinking a lot of fluids. I spoke with pt and he said his weight had gone down about 4lbs and he is monitoring his fluid intake now. He reports no edema and no new or worsening shortness of breath.   Routed to [place]

## 2020-02-22 ENCOUNTER — Telehealth: Payer: Self-pay | Admitting: Cardiology

## 2020-02-22 MED ORDER — POTASSIUM CHLORIDE CRYS ER 20 MEQ PO TBCR
EXTENDED_RELEASE_TABLET | ORAL | 0 refills | Status: DC
Start: 2020-02-22 — End: 2020-02-27

## 2020-02-22 NOTE — Telephone Encounter (Signed)
Follow up:    Patient was calling to results.

## 2020-02-22 NOTE — Telephone Encounter (Signed)
Patients HHRN called to report pt had labs drawn at pcp 11/8 and his potassium was 5.1. Pt is currently taking 60 meq of K four times daily. She said she can redraw labs and make any medication adjustments if the provider needed to do so. I called PCP and had them fax lab results. Per Dr.McLean hold next dose of K. Decrease K to 42mq 4times daily. Repeat bmet Monday. Left detailed message for RN Cara at [phone]

## 2020-02-26 ENCOUNTER — Telehealth (HOSPITAL_COMMUNITY): Payer: Self-pay

## 2020-02-26 ENCOUNTER — Telehealth (HOSPITAL_COMMUNITY): Payer: Self-pay | Admitting: *Deleted

## 2020-02-26 NOTE — Telephone Encounter (Signed)
Left message for Charles Gray to return my call in reference to new referral for paramedicine and establishing home visits. I will continue to reach out.

## 2020-02-26 NOTE — Progress Notes (Signed)
ADVANCED HF CLINIC CONSULT NOTE  Primary Care: Enid Skeens., MD Primary Cardiologist: Dr. Berniece Salines, DO  HF Cardiology: Dr. [name]  HPI: 64 y.o. 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    He has had multiple hospitalizations in 2021 for CHF exacerbation, requiring IV Lasix and change in PO regimen from lasix to torsemide. Echo 4/21 showed normal LVEF 55-60% w/ G1DD. RV not well visualized, but felt to have component of RV failure. [place] 4/21 showed normal PA pressure and normal cardiac output. PFTs also completed and c/w mild obstructive airway disease.   In 7/21, he had a coronary CTA.  This showed no coronary calcium but there was a concern for soft plaque in the distal LCx and distal RCA with hemodynamically significant stenosis.  He was managed medically. He has not had any chest pain, but was started on ranolazine after his CTA.  He feels like it makes his "head fuzzy."    Admitted with A/C diastolic heart failure. Diuresed with IV lasix and transitioned to bumex 3 mg twice a day. Discharge weight 360 pounds. Discharged 02/09/2020.   Today he returns for HF follow up. Weight at home has been trending up. Overall feeling fine. SOB with exertion. Denies PND/Orthopnea. Uses a walker in the house. Eating lots of frozen meals. Appetite ok. No fever or chills. Weight at home trending up 367--->372  pounds. He gets food delivered from [facility] to his house. Taking all medications. Followed by [facility]. Requires assistance with transportation. Lives alone. He is disabled due to back injury.   Labs (6/21): K 4.3, creatinine 1.03 Labs (02/16/2020):: K  4.4 Creatinine 1.07. BNP 33    PMH: 1. HTN 2. OSA 3. Hyperlipidemia 4. Low back pain 5. PE/DVT in 2/21.  - V/Q scan in 6/21 was not suggestive of chronic PE.  6. Nonalcoholic steatohepatitis.  7. GI bleed in 10/20.  8. Anxiety 9. CAD:  Coronary CTA in 7/21 with calcium score 0 but concern for soft plaque distal CFX and RCA.  By [facility], there was flow-limiting stenosis in distal CFX and distal RCA.  10. Chronic diastolic CHF: Echo in 0/08 with EF 55-60%, RV poorly visualized.  - RHC (4/21): mean RA 8, PA 20/8, mean PCWP 11, CI 2.8 11. PFTs (4/21): Mild obstruction.   Review of Systems: All systems reviewed and negative except as per HPI.    Current Outpatient Medications  Medication Sig Dispense Refill  . acetaminophen (TYLENOL) 325 MG tablet Take 2 tablets (650 mg total) by mouth every 4 (four) hours as needed for fever or mild pain.    Marland Kitchen apixaban (ELIQUIS) 5 MG TABS tablet Take 1 tablet (5 mg total) by mouth 2 (two) times daily. 60 tablet 0  . bumetanide (BUMEX) 1 MG tablet Take 3 tablets (3 mg total) by mouth 2 (two) times daily. 180 tablet 0  . Buprenorphine HCl-Naloxone HCl 8-2 MG FILM Place 1 tablet under the tongue in the morning and at bedtime.     . gabapentin (NEURONTIN) 800 MG tablet Take 1 tablet (800 mg total) by mouth 3 (three) times daily. 90 tablet 1  . metoprolol tartrate (LOPRESSOR) 50 MG tablet Take 1 tablet (50 mg total) by mouth 2 (two) times daily. 180 tablet 1  . pantoprazole (PROTONIX) 40 MG tablet Take 40 mg by mouth 2 (two) times daily.    . polyethylene glycol (MIRALAX / GLYCOLAX) 17 g packet  Take 17 g by mouth daily. (Patient taking differently: Take 17 g by mouth daily as needed for mild constipation. ) 14 each 0  . potassium chloride (KLOR-CON) 20 MEQ packet Take 60 mEq by mouth 3 (three) times daily.    . pravastatin (PRAVACHOL) 20 MG tablet Take 1 tablet (20 mg total) by mouth daily. 30 tablet 1  . spironolactone (ALDACTONE) 100 MG tablet Take 1 tablet (100 mg total) by mouth daily. 30 tablet 0  . VENTOLIN HFA 108 (90 Base) MCG/ACT inhaler Inhale 1 puff into the lungs every 4 (four) hours as needed for shortness of breath. 6.7 g 1  . zolpidem (AMBIEN) 10 MG tablet Take 10 mg by mouth at bedtime as  needed for sleep.      No current facility-administered medications for this encounter.    Allergies  Allergen Reactions  . Penicillins     Did it involve swelling of the face/tongue/throat, SOB, or low BP? N/A Did it involve sudden or severe rash/hives, skin peeling, or any reaction on the inside of your mouth or nose? N/A Did you need to seek medical attention at a hospital or doctor's office?N/A When did it last happen?N/A If all above answers are "NO", may proceed with cephalosporin use.      Social History   Socioeconomic History  . Marital status: Single    Spouse name: Not on file  . Number of children: Not on file  . Years of education: Not on file  . Highest education level: Not on file  Occupational History  . Not on file  Tobacco Use  . Smoking status: Never Smoker  . Smokeless tobacco: Never Used  Vaping Use  . Vaping Use: Never used  Substance and Sexual Activity  . Alcohol use: Never  . Drug use: Not Currently    Types: Marijuana    Comment: Smoked for 30 years  . Sexual activity: Not on file  Other Topics Concern  . Not on file  Social History Narrative  . Not on file   Social Determinants of Health   Financial Resource Strain: Low Risk   . Difficulty of Paying Living Expenses: Not very hard  Food Insecurity: No Food Insecurity  . Worried About Charity fundraiser in the Last Year: Never true  . Ran Out of Food in the Last Year: Never true  Transportation Needs: Unmet Transportation Needs  . Lack of Transportation (Medical): Yes  . Lack of Transportation (Non-Medical): Yes  Physical Activity:   . Days of Exercise per Week: Not on file  . Minutes of Exercise per Session: Not on file  Stress:   . Feeling of Stress : Not on file  Social Connections:   . Frequency of Communication with Friends and Family: Not on file  . Frequency of Social Gatherings with Friends and Family: Not on file  . Attends Religious Services: Not on file  . Active  Member of Clubs or Organizations: Not on file  . Attends Archivist Meetings: Not on file  . Marital Status: Not on file  Intimate Partner Violence:   . Fear of Current or Ex-Partner: Not on file  . Emotionally Abused: Not on file  . Physically Abused: Not on file  . Sexually Abused: Not on file      Family History  Problem Relation Age of Onset  . Hyperlipidemia Mother   . Hypertension Mother   . Stroke Mother   . CAD Maternal Grandmother   .  CAD Maternal Grandfather     Vitals:   02/27/20 1141  BP: (!) 132/106  Pulse: 86  SpO2: 94%  Weight: (!) 167.4 kg (369 lb)  Height: 6' 10"  (2.083 m)    ReDS Vest / Clip - 02/27/20 1200      ReDS Vest / Clip   Station Marker D    Ruler Value 45    ReDS Value Range Low volume    ReDS Actual Value 32    Anatomical Comments sitting            PHYSICAL EXAM: General:   No resp difficulty HEENT: normal Neck: supple. no JVD. Carotids 2+ bilat; no bruits. No lymphadenopathy or thryomegaly appreciated. Cor: PMI nondisplaced. Regular rate & rhythm. No rubs, gallops or murmurs. Lungs: clear Abdomen: obese, soft, nontender, nondistended. No hepatosplenomegaly. No bruits or masses. Good bowel sounds. Extremities: no cyanosis, clubbing, rash, edema Neuro: alert & orientedx3, cranial nerves grossly intact. moves all 4 extremities w/o difficulty. Affect pleasant  EKG; SR 73 bpm   ASSESSMENT & PLAN: 1. Chronic diastolic CHF: Echo in 7/49 with EF 55-60%, RV poorly visualized.  [place] in 4/21 actually showed optimized filling pressures and no pulmonary hypertension.     -No Obesity and deconditioning likely play a large role. We discussed Cardiomems but he is not interested.  -NYHA III. Volume status stable. Reds Clip 32%.  - Complaining gynecomastia. Stop Spironolactone. Start inspra 100 mg daily.  - Continue bumex 3 mg twice a day .  - Discussed low salt food choices. He will be set up for [employer].   2. H/o PE/DVT: Suspect  due to sedentary lifestyle.  Occurred in 2/21.  V/Q scan in 6/21 did not show evidence for chronic PE.   - Continue apixaban.   - No bleeding issues.  3. OSA: Needs to use CPAP.  4. Anxiety/panic:  - Continue sertraline, 25 mg daily.  5. CAD: Coronary CTA reviewed, suspected soft plaque distal RCA and distal LCX that is hemodynamically significant by FFR, but coronary calcium score surprisingly is 0.  - Continue statin 6. Hypertension  Add 12.5 losartan daily. 7. Obesity Body mass index is 38.58 kg/m. Discussed portion control.    Start HF Paramedicine. Discussed medication changes with HF Paramedic today. Follow up in 3-4 weeks. Check BMET today.  Charles Gray 02/27/2020

## 2020-02-26 NOTE — Telephone Encounter (Signed)
Spoke to [place] and I will be meeting him in clinic tomorrow at 1130. Call completed.

## 2020-02-26 NOTE — Telephone Encounter (Signed)
Charles Gray left VM Friday at 2:44pm stating pts weight was up 3lbs overnight, c/o more shortness of breath, and swelling. I called pt back this morning to check on weight and symptoms pt did not answer. Charles Gray [facility] will make home visit to patient today and call our office with update.

## 2020-02-27 ENCOUNTER — Other Ambulatory Visit (HOSPITAL_COMMUNITY): Payer: Self-pay

## 2020-02-27 ENCOUNTER — Ambulatory Visit: Payer: Medicare HMO | Admitting: Cardiology

## 2020-02-27 ENCOUNTER — Other Ambulatory Visit: Payer: Self-pay

## 2020-02-27 ENCOUNTER — Other Ambulatory Visit (HOSPITAL_COMMUNITY): Payer: Self-pay | Admitting: Cardiology

## 2020-02-27 ENCOUNTER — Encounter (HOSPITAL_COMMUNITY): Payer: Self-pay

## 2020-02-27 ENCOUNTER — Ambulatory Visit (HOSPITAL_COMMUNITY)
Admit: 2020-02-27 | Discharge: 2020-02-27 | Disposition: A | Discharge status: Home / Self Care | Payer: Medicare HMO | Source: Ambulatory Visit | Attending: Adult Health | Admitting: Adult Health

## 2020-02-27 ENCOUNTER — Telehealth (HOSPITAL_COMMUNITY): Payer: Self-pay | Admitting: Licensed Clinical Social Worker

## 2020-02-27 VITALS — BP 132/106 | HR 86 | Ht >= 80 in | Wt 369.0 lb

## 2020-02-27 DIAGNOSIS — Z6838 Body mass index (BMI) 38.0-38.9, adult: Secondary | ICD-10-CM | POA: Insufficient documentation

## 2020-02-27 DIAGNOSIS — F419 Anxiety disorder, unspecified: Secondary | ICD-10-CM | POA: Insufficient documentation

## 2020-02-27 DIAGNOSIS — I5021 Acute systolic (congestive) heart failure: Secondary | ICD-10-CM

## 2020-02-27 DIAGNOSIS — Z7901 Long term (current) use of anticoagulants: Secondary | ICD-10-CM | POA: Insufficient documentation

## 2020-02-27 DIAGNOSIS — I110 Hypertensive heart disease with heart failure: Secondary | ICD-10-CM | POA: Insufficient documentation

## 2020-02-27 DIAGNOSIS — Z880 Allergy status to penicillin: Secondary | ICD-10-CM | POA: Diagnosis not present

## 2020-02-27 DIAGNOSIS — Z79899 Other long term (current) drug therapy: Secondary | ICD-10-CM | POA: Diagnosis not present

## 2020-02-27 DIAGNOSIS — Z86711 Personal history of pulmonary embolism: Secondary | ICD-10-CM | POA: Insufficient documentation

## 2020-02-27 DIAGNOSIS — R0600 Dyspnea, unspecified: Secondary | ICD-10-CM | POA: Diagnosis not present

## 2020-02-27 DIAGNOSIS — N62 Hypertrophy of breast: Secondary | ICD-10-CM | POA: Diagnosis not present

## 2020-02-27 DIAGNOSIS — I5032 Chronic diastolic (congestive) heart failure: Secondary | ICD-10-CM | POA: Diagnosis not present

## 2020-02-27 DIAGNOSIS — R0609 Other forms of dyspnea: Secondary | ICD-10-CM

## 2020-02-27 DIAGNOSIS — I2510 Atherosclerotic heart disease of native coronary artery without angina pectoris: Secondary | ICD-10-CM | POA: Insufficient documentation

## 2020-02-27 DIAGNOSIS — E669 Obesity, unspecified: Secondary | ICD-10-CM | POA: Insufficient documentation

## 2020-02-27 DIAGNOSIS — Z86718 Personal history of other venous thrombosis and embolism: Secondary | ICD-10-CM | POA: Insufficient documentation

## 2020-02-27 DIAGNOSIS — G4733 Obstructive sleep apnea (adult) (pediatric): Secondary | ICD-10-CM | POA: Diagnosis not present

## 2020-02-27 MED ORDER — EPLERENONE 50 MG PO TABS
100.0000 mg | ORAL_TABLET | Freq: Every day | ORAL | 3 refills | Status: DC
Start: 2020-02-27 — End: 2020-06-25

## 2020-02-27 MED ORDER — LOSARTAN POTASSIUM 25 MG PO TABS: 12.5000 mg | ORAL_TABLET | Freq: Every day | ORAL | 3 refills | Status: DC

## 2020-02-27 NOTE — Progress Notes (Signed)
ReDS Vest / Clip - 02/27/20 1200      ReDS Vest / Clip   Station Marker D    Ruler Value 45    ReDS Value Range Low volume    ReDS Actual Value 32    Anatomical Comments sitting

## 2020-02-27 NOTE — Patient Instructions (Signed)
START Eplerenone 1102m (2 tablets) Daily  START Losartan 12.556m(1/2 tablet) Every day at bedtime  STOP Spironolactone  Labs done today, your results will be available in MyChart, we will contact you for abnormal readings.  Your physician recommends that you schedule a follow-up appointment in: 4 weeks   If you have any questions or concerns before your next appointment please send us[place] message through myMenokenr call our office at [phone]   TO LEAVE A MESSAGE FOR THE NURSE SELECT OPTION 2, PLEASE LEAVE A MESSAGE INCLUDING: . YOUR NAME . DATE OF BIRTH . CALL BACK NUMBER . REASON FOR CALL**this is important as we prioritize the call backs  YOU WILL RECEIVE A CALL BACK THE SAME DAY AS LONG AS YOU CALL BEFORE 4:00 PM

## 2020-02-27 NOTE — Telephone Encounter (Signed)
Community Paramedic reached out to CSW to inquire about referral for moms meals program which would provide pt with 4 weeks of low sodium meals.  Pt struggles with fluid retention likely in large part to high sodium diet.  Pt does not appear to be enrolled with [facility] but CSW completed referral form for patient and sent to [employer] for review to see if he is eligible for 1 month delivery- awaiting determination  Will continue to follow and assist as needed  Jorge Ny, [facility] Desk#: [phone] Cell#: [phone]

## 2020-02-27 NOTE — Progress Notes (Signed)
Paramedicine Encounter    Patient ID: Charles Gray, male    DOB: March 17, 1956, 64 y.o.   MRN: [id number]   Met with Charles Gray today in clinic where he was seen by Charles Grinder, PA. Charles Gray reports he feels like he is retaining fluid with poor eating choices and increased weight gain. Charles Gray CLIP at 32%, slight edema weight up today per patient around 7lbs. Charles Gray reports he retains fluid in his legs and abdomen. Charles Gray reports he feels he would benefit from some guided meal plans and recipes for his air fryer and slow cooker. I will assist with same. Charles Gray's medications were reviewed. [name], PA discontinued Spironolactone today and added Inspra 152m. She also added Losartan 12.582mat Bedtime. Patient reports he will get his medications at [employer] his sons assistance. Charles Gray also reports, [facility] is seeing [facility] in the home and his current nurse is Charles CookeyI will reach out to her tomorrow. I will be seeing Charles Gray in one week in the home and will be consulting with Charles GrinderPA with updates. Pill box confirmed and re-filled today. No refills needed at this time. Visit complete.   Charles Gray (CArbie [phone]: Home visit completed Next visit planned for one week

## 2020-02-29 ENCOUNTER — Other Ambulatory Visit: Payer: Self-pay

## 2020-02-29 ENCOUNTER — Telehealth: Payer: Self-pay

## 2020-02-29 ENCOUNTER — Encounter: Payer: Medicare HMO | Admitting: Cardiology

## 2020-02-29 ENCOUNTER — Encounter: Payer: Self-pay | Admitting: Cardiology

## 2020-02-29 VITALS — BP 123/67 | HR 80 | Ht >= 80 in | Wt 367.0 lb

## 2020-02-29 NOTE — Progress Notes (Signed)
This encounter was created in error - please disregard.

## 2020-02-29 NOTE — Telephone Encounter (Signed)
Patient identified as a candidate to receive 7 heart healthy meals per week for 4 weeks through [facility].  Completed referral sent in for review.  Anticipate patient will receive first shipment of food in 1-3 business days.  Jorge Ny, LCSW Clinical Social Worker [facility] Desk#: [phone] Cell#: [phone]

## 2020-02-29 NOTE — Telephone Encounter (Signed)
°  Patient Consent for Virtual Visit         Charles Gray has provided verbal consent on 02/29/2020 for a virtual visit (video or telephone).   CONSENT FOR VIRTUAL VISIT FOR:  Charles Gray  By participating in this virtual visit I agree to the following:  I hereby voluntarily request, consent and authorize [place] and its employed or contracted physicians, physician assistants, nurse practitioners or other licensed health care professionals (the Practitioner), to provide me with telemedicine health care services (the Services") as deemed necessary by the treating Practitioner. I acknowledge and consent to receive the Services by the Practitioner via telemedicine. I understand that the telemedicine visit will involve communicating with the Practitioner through live audiovisual communication technology and the disclosure of certain medical information by electronic transmission. I acknowledge that I have been given the opportunity to request an in-person assessment or other available alternative prior to the telemedicine visit and am voluntarily participating in the telemedicine visit.  I understand that I have the right to withhold or withdraw my consent to the use of telemedicine in the course of my care at any time, without affecting my right to future care or treatment, and that the Practitioner or I may terminate the telemedicine visit at any time. I understand that I have the right to inspect all information obtained and/or recorded in the course of the telemedicine visit and may receive copies of available information for a reasonable fee.  I understand that some of the potential risks of receiving the Services via telemedicine include:   Delay or interruption in medical evaluation due to technological equipment failure or disruption;  Information transmitted may not be sufficient (e.g. poor resolution of images) to allow for appropriate medical decision making by the  Practitioner; and/or   In rare instances, security protocols could fail, causing a breach of personal health information.  Furthermore, I acknowledge that it is my responsibility to provide information about my medical history, conditions and care that is complete and accurate to the best of my ability. I acknowledge that Practitioner's advice, recommendations, and/or decision may be based on factors not within their control, such as incomplete or inaccurate data provided by me or distortions of diagnostic images or specimens that may result from electronic transmissions. I understand that the practice of medicine is not an exact science and that Practitioner makes no warranties or guarantees regarding treatment outcomes. I acknowledge that a copy of this consent can be made available to me via my patient portal ([facility]), or I can request a printed copy by calling the office of [place].    I understand that my insurance will be billed for this visit.   I have read or had this consent read to me.  I understand the contents of this consent, which adequately explains the benefits and risks of the Services being provided via telemedicine.   I have been provided ample opportunity to ask questions regarding this consent and the Services and have had my questions answered to my satisfaction.  I give my informed consent for the services to be provided through the use of telemedicine in my medical care

## 2020-03-04 ENCOUNTER — Other Ambulatory Visit (HOSPITAL_COMMUNITY): Payer: Self-pay | Admitting: Cardiology

## 2020-03-04 MED ORDER — BUMETANIDE 1 MG PO TABS
3.0000 mg | ORAL_TABLET | Freq: Two times a day (BID) | ORAL | 3 refills | Status: DC
Start: 2020-03-04 — End: 2020-05-10

## 2020-03-05 ENCOUNTER — Other Ambulatory Visit (HOSPITAL_COMMUNITY): Payer: Self-pay

## 2020-03-05 NOTE — Progress Notes (Signed)
Paramedicine Encounter    Patient ID: Charles Gray, male    DOB: 02-10-1956, 64 y.o.   MRN: [id number]   Patient Care Team: Enid Skeens., MD as PCP - General (Family Medicine) Sueanne Margarita, MD as PCP - Cardiology (Cardiology)  Patient Active Problem List   Diagnosis Date Noted  . Acute on chronic right heart failure ([place]) 01/27/2020  . Acute respiratory failure with hypoxia ([place])   . Fatty liver disease, nonalcoholic   . Shock ([place])   . Personal history of DVT (deep vein thrombosis) 12/26/2019  . Chronic anticoagulation 12/26/2019  . Palpitations 12/26/2019  . Atrial fibrillation ([place]) 12/23/2019  . Heart failure, systolic, acute ([place]) 73/22/0254  . Acute on chronic congestive heart failure ([place])   . Acute on chronic diastolic CHF (congestive heart failure) ([place]) 08/23/2019  . Redness and swelling of lower leg 08/23/2019  . Chest pain 08/23/2019  . Lactic acidosis 08/23/2019  . History of pulmonary embolism 08/12/2019  . Atrial tachycardia ([place]) 08/12/2019  . Occasional tremors 08/12/2019  . OSA (obstructive sleep apnea) 08/12/2019  . Acute on chronic right-sided heart failure ([place]) 08/06/2019  . Pulmonary embolism ([place]) 06/08/2019  . Normocytic anemia 06/08/2019  . Pulmonary emboli ([place]) 06/08/2019  . Chronic heart failure with preserved ejection fraction ([place]) 05/30/2019  . Class 2 severe obesity due to excess calories with serious comorbidity and body mass index (BMI) of 39.0 to 39.9 in adult ([place]) 05/30/2019  . Morbid obesity with BMI of 40.0-44.9, adult ([place]) 05/30/2019  . Hyponatremia   . Benign essential HTN   . Drug induced constipation   . Peripheral edema   . Hypotension due to drugs   . Hypoalbuminemia due to protein-calorie malnutrition ([place])   . Hypokalemia   . Thrombocytopenia ([place])   . Anemia of chronic disease   . Cirrhosis of liver without ascites ([place])   . Chronic pain syndrome   . Debility 03/13/2019  . Portal hypertensive gastropathy ([place])  03/06/2019  . Dyspnea   . GIB (gastrointestinal bleeding) 03/04/2019  . Mixed hyperlipidemia 01/24/2019  . Insomnia 01/24/2019  . Hyperlipidemia 01/24/2019  . Hypertension 01/24/2019  . Lumbar disc herniation 01/24/2019    Current Outpatient Medications:  .  acetaminophen (TYLENOL) 325 MG tablet, Take 2 tablets (650 mg total) by mouth every 4 (four) hours as needed for fever or mild pain., Disp:  , Rfl:  .  apixaban (ELIQUIS) 5 MG TABS tablet, Take 1 tablet (5 mg total) by mouth 2 (two) times daily., Disp: 60 tablet, Rfl: 0 .  bumetanide (BUMEX) 1 MG tablet, Take 3 tablets (3 mg total) by mouth 2 (two) times daily., Disp: 180 tablet, Rfl: 3 .  Buprenorphine HCl-Naloxone HCl 8-2 MG FILM, Place 1 tablet under the tongue in the morning and at bedtime. , Disp: , Rfl:  .  eplerenone (INSPRA) 50 MG tablet, Take 2 tablets (100 mg total) by mouth daily., Disp: 60 tablet, Rfl: 3 .  gabapentin (NEURONTIN) 800 MG tablet, Take 1 tablet (800 mg total) by mouth 3 (three) times daily., Disp: 90 tablet, Rfl: 1 .  losartan (COZAAR) 25 MG tablet, Take 0.5 tablets (12.5 mg total) by mouth at bedtime., Disp: 30 tablet, Rfl: 3 .  metoprolol tartrate (LOPRESSOR) 50 MG tablet, Take 1 tablet (50 mg total) by mouth 2 (two) times daily., Disp: 180 tablet, Rfl: 1 .  pantoprazole (PROTONIX) 40 MG tablet, Take 40 mg by mouth 2 (two) times daily., Disp: , Rfl:  .  polyethylene  glycol (MIRALAX / GLYCOLAX) 17 g packet, Take 17 g by mouth daily. (Patient taking differently: Take 17 g by mouth daily as needed for mild constipation. ), Disp: 14 each, Rfl: 0 .  potassium chloride SA (KLOR-CON) 20 MEQ tablet, Take 40 mEq by mouth in the morning, at noon, in the evening, and at bedtime., Disp: , Rfl:  .  pravastatin (PRAVACHOL) 20 MG tablet, Take 1 tablet (20 mg total) by mouth daily., Disp: 30 tablet, Rfl: 1 .  VENTOLIN HFA 108 (90 Base) MCG/ACT inhaler, Inhale 1 puff into the lungs every 4 (four) hours as needed for shortness of  breath., Disp: 6.7 g, Rfl: 1 .  zolpidem (AMBIEN) 10 MG tablet, Take 10 mg by mouth at bedtime as needed for sleep. , Disp: , Rfl:  Allergies  Allergen Reactions  . Penicillins     Did it involve swelling of the face/tongue/throat, SOB, or low BP? N/A Did it involve sudden or severe rash/hives, skin peeling, or any reaction on the inside of your mouth or nose? N/A Did you need to seek medical attention at a hospital or doctor's office?N/A When did it last happen?N/A If all above answers are "NO", may proceed with cephalosporin use.     Social History   Socioeconomic History  . Marital status: Single    Spouse name: Not on file  . Number of children: Not on file  . Years of education: Not on file  . Highest education level: Not on file  Occupational History  . Not on file  Tobacco Use  . Smoking status: Never Smoker  . Smokeless tobacco: Never Used  Vaping Use  . Vaping Use: Never used  Substance and Sexual Activity  . Alcohol use: Never  . Drug use: Not Currently    Types: Marijuana    Comment: Smoked for 30 years  . Sexual activity: Not on file  Other Topics Concern  . Not on file  Social History Narrative  . Not on file   Social Determinants of Health   Financial Resource Strain: Low Risk   . Difficulty of Paying Living Expenses: Not very hard  Food Insecurity: No Food Insecurity  . Worried About Charity fundraiser in the Last Year: Never true  . Ran Out of Food in the Last Year: Never true  Transportation Needs: Unmet Transportation Needs  . Lack of Transportation (Medical): Yes  . Lack of Transportation (Non-Medical): Yes  Physical Activity:   . Days of Exercise per Week: Not on file  . Minutes of Exercise per Session: Not on file  Stress:   . Feeling of Stress : Not on file  Social Connections:   . Frequency of Communication with Friends and Family: Not on file  . Frequency of Social Gatherings with Friends and Family: Not on file  . Attends Religious  Services: Not on file  . Active Member of Clubs or Organizations: Not on file  . Attends Archivist Meetings: Not on file  . Marital Status: Not on file  Intimate Partner Violence:   . Fear of Current or Ex-Partner: Not on file  . Emotionally Abused: Not on file  . Physically Abused: Not on file  . Sexually Abused: Not on file    Physical Exam Vitals reviewed.  Constitutional:      Appearance: He is normal weight.  HENT:     Head: Normocephalic.     Nose: Nose normal.     Mouth/Throat:  Mouth: Mucous membranes are moist.     Pharynx: Oropharynx is clear.  Eyes:     Pupils: Pupils are equal, round, and reactive to light.  Cardiovascular:     Rate and Rhythm: Normal rate and regular rhythm.     Pulses: Normal pulses.     Heart sounds: Normal heart sounds.  Pulmonary:     Effort: Pulmonary effort is normal.     Breath sounds: Normal breath sounds.  Abdominal:     General: Abdomen is flat.     Palpations: Abdomen is soft.  Musculoskeletal:        General: Swelling present. Normal range of motion.     Cervical back: Normal range of motion.     Right lower leg: Edema present.     Left lower leg: Edema present.  Skin:    General: Skin is warm and dry.     Capillary Refill: Capillary refill takes less than 2 seconds.  Neurological:     General: No focal deficit present.     Mental Status: He is alert. Mental status is at baseline.  Psychiatric:        Mood and Affect: Mood normal.     Arrived for home visit for Markie who was alert and oriented reporting he was feeling poorly today with increased shortness of breath and fatigue. Zavion has been taking his medications as prescribed for the exception of the Losartan which he has decided to not take due to his blood pressure being fine. Readings all trend less than 120 SBP. I reported same to [facility]. Vitals obtained and as noted. Medications reviewed and confirmed. Pill box filled accordingly. Dallyn reports [place]  with [facility] comes to see him weekly. I will reach out to communicate about mutual patient. Needs labs repeated if possible at his cardiology apt tomorrow, I will communicate same. Daran agreed to visit in one week and will reach out if he needs anything in the mean time. Home visit complete.   Refills- NONE     Future Appointments  Date Time Provider [place]  03/06/2020  3:00 PM Berniece Salines, DO [facility] None  03/19/2020  2:40 PM Sueanne Margarita, MD [facility]  04/23/2020 10:30 AM [facility] PA/NP [facility] None     ACTION: Home visit completed Next visit planned for one week

## 2020-03-06 ENCOUNTER — Other Ambulatory Visit: Payer: Self-pay

## 2020-03-06 ENCOUNTER — Telehealth (HOSPITAL_COMMUNITY): Payer: Self-pay | Admitting: Cardiology

## 2020-03-06 ENCOUNTER — Ambulatory Visit (INDEPENDENT_AMBULATORY_CARE_PROVIDER_SITE_OTHER): Payer: Medicare HMO | Admitting: Cardiology

## 2020-03-06 ENCOUNTER — Encounter: Payer: Self-pay | Admitting: Cardiology

## 2020-03-06 VITALS — BP 116/64 | HR 75 | Ht >= 80 in | Wt 372.4 lb

## 2020-03-06 DIAGNOSIS — I5032 Chronic diastolic (congestive) heart failure: Secondary | ICD-10-CM

## 2020-03-06 DIAGNOSIS — E782 Mixed hyperlipidemia: Secondary | ICD-10-CM

## 2020-03-06 DIAGNOSIS — I480 Paroxysmal atrial fibrillation: Secondary | ICD-10-CM | POA: Diagnosis not present

## 2020-03-06 DIAGNOSIS — I2782 Chronic pulmonary embolism: Secondary | ICD-10-CM

## 2020-03-06 DIAGNOSIS — G4733 Obstructive sleep apnea (adult) (pediatric): Secondary | ICD-10-CM

## 2020-03-06 DIAGNOSIS — I10 Essential (primary) hypertension: Secondary | ICD-10-CM

## 2020-03-06 NOTE — Telephone Encounter (Signed)
-----   Message from Consuelo Pandy, [place] sent at 03/06/2020  1:16 PM EST ----- Regarding: FW: labs  ----- Message ----- From: Jeris Penta, EMT Sent: 03/05/2020   4:17 PM EST To: Consuelo Pandy, PA-C Subject: labs                                           Algis Greenhouse, can you place an order for Charles Gray to have labs drawn tomorrow at Dr. Quintella Reichert, Cardiology in [place]. Last weeks labs did not get obtained and he needs a repeat since discharge if at all possible.

## 2020-03-06 NOTE — Progress Notes (Signed)
Cardiology Office Note:    Date:  03/06/2020   ID:  Charles Gray, DOB 02/27/1956, MRN [id number]  PCP:  Enid Skeens., MD  Cardiologist:  Berniece Salines, DO  Electrophysiologist:  None   Referring MD: Enid Skeens., MD   " I am the just tired these day"   History of Present Illness:    Charles Gray is a 64 y.o. male with a hx of mild COPD, history of liver cirrhosis, DVT/PE in February 2021 on Eliquis, paroxysmal atrial fibrillation also on Eliquis, chronic diastolic heart failure, coronary artery disease by coronary CTA. The patient is here today for follow-up visit.  The has had multiple hospitalizations since last year and over the course of this year for exacerbation of heart failure as well as atrial fibrillation. His most recent hospitalization was in October 2021, at this time he was transitioned to Bumex.   He is here today for follow-up visit. He tells me that he has been tired since his hospitalization. He notes that his blood pressure has been also on the lower side therefore he did not start taking the losartan was added to his regimen at the time of discharge.   Past Medical History:  Diagnosis Date  . Acute on chronic congestive heart failure ([place])   . Acute on chronic diastolic CHF (congestive heart failure) ([place]) 08/23/2019  . Acute on chronic right-sided heart failure ([place]) 08/06/2019  . Acute respiratory failure with hypoxia ([place])   . Anemia of chronic disease   . Atrial fibrillation ([place]) 12/23/2019  . Atrial tachycardia ([place]) 08/12/2019  . Benign essential HTN   . Chest pain 08/23/2019  . Chronic anticoagulation 12/26/2019  . Chronic heart failure with preserved EF 05/30/2019   Echo 07/2019: EF 55-60, GR 1 DD  . Chronic pain syndrome   . Cirrhosis of liver without ascites ([place])   . Class 2 severe obesity due to excess calories with serious comorbidity and body mass index (BMI) of 39.0 to 39.9 in adult ([place]) 05/30/2019  . Debility 03/13/2019  . Drug  induced constipation   . Dyspnea   . Fatty liver disease, nonalcoholic   . GIB (gastrointestinal bleeding) 03/04/2019  . Heart failure, systolic, acute ([place]) 8/88/9169  . History of pulmonary embolism 08/12/2019  . Hyperlipidemia 01/24/2019  . Hypertension 01/24/2019  . Hypoalbuminemia due to protein-calorie malnutrition ([place])   . Hypokalemia   . Hyponatremia   . Hypotension due to drugs   . Insomnia 01/24/2019  . Lactic acidosis 08/23/2019  . Lumbar disc herniation 01/24/2019  . Mixed hyperlipidemia 01/24/2019  . Morbid obesity with BMI of 40.0-44.9, adult ([place]) 05/30/2019  . Normocytic anemia 06/08/2019  . Occasional tremors 08/12/2019  . OSA (obstructive sleep apnea) 08/12/2019  . Palpitations 12/26/2019  . Peripheral edema   . Personal history of DVT (deep vein thrombosis) 12/26/2019  . Portal hypertensive gastropathy ([place]) 03/06/2019   Seen on EGD 03/06/2019  . Pulmonary emboli ([place]) 06/08/2019  . Pulmonary embolism ([place]) 06/08/2019  . Redness and swelling of lower leg 08/23/2019  . Shock ([place])   . Thrombocytopenia ([place])     Past Surgical History:  Procedure Laterality Date  . ESOPHAGOGASTRODUODENOSCOPY (EGD) WITH PROPOFOL N/A 03/05/2019   Procedure: ESOPHAGOGASTRODUODENOSCOPY (EGD) WITH PROPOFOL;  Surgeon: Doran Stabler, MD;  Location: [facility];  Service: Gastroenterology;  Laterality: N/A;  Large Blood Clot Removed  . ESOPHAGOGASTRODUODENOSCOPY (EGD) WITH PROPOFOL N/A 03/06/2019   Procedure: ESOPHAGOGASTRODUODENOSCOPY (EGD) WITH PROPOFOL;  Surgeon: Thornton Park, MD;  [facility];  Service: Gastroenterology;  Laterality: N/A;  . HEMORROIDECTOMY    . RIGHT HEART CATH N/A 08/09/2019   Procedure: RIGHT HEART CATH;  Surgeon: Larey Dresser, MD;  Location: [facility];  Service: Cardiovascular;  Laterality: N/A;    Current Medications: Current Meds  Medication Sig  . acetaminophen (TYLENOL) 325 MG tablet Take 2 tablets (650 mg total) by mouth every 4  (four) hours as needed for fever or mild pain.  Marland Kitchen apixaban (ELIQUIS) 5 MG TABS tablet Take 1 tablet (5 mg total) by mouth 2 (two) times daily.  . bumetanide (BUMEX) 1 MG tablet Take 3 tablets (3 mg total) by mouth 2 (two) times daily.  . Buprenorphine HCl-Naloxone HCl 8-2 MG FILM Place 1 tablet under the tongue in the morning and at bedtime.   Marland Kitchen eplerenone (INSPRA) 50 MG tablet Take 2 tablets (100 mg total) by mouth daily.  Marland Kitchen gabapentin (NEURONTIN) 800 MG tablet Take 1 tablet (800 mg total) by mouth 3 (three) times daily. (Patient taking differently: Take by mouth 3 (three) times daily. )  . losartan (COZAAR) 25 MG tablet Take 0.5 tablets (12.5 mg total) by mouth at bedtime.  . metoprolol tartrate (LOPRESSOR) 50 MG tablet Take 1 tablet (50 mg total) by mouth 2 (two) times daily.  . pantoprazole (PROTONIX) 40 MG tablet Take 40 mg by mouth 2 (two) times daily.  . polyethylene glycol (MIRALAX / GLYCOLAX) 17 g packet Take 17 g by mouth daily. (Patient taking differently: Take 17 g by mouth daily as needed for mild constipation. )  . potassium chloride SA (KLOR-CON) 20 MEQ tablet Take 40 mEq by mouth in the morning, at noon, in the evening, and at bedtime.  . pravastatin (PRAVACHOL) 20 MG tablet Take 1 tablet (20 mg total) by mouth daily.  . VENTOLIN HFA 108 (90 Base) MCG/ACT inhaler Inhale 1 puff into the lungs every 4 (four) hours as needed for shortness of breath.  . zolpidem (AMBIEN) 10 MG tablet Take 10 mg by mouth at bedtime as needed for sleep.      Allergies:   Penicillins   Social History   Socioeconomic History  . Marital status: Single    Spouse name: Not on file  . Number of children: Not on file  . Years of education: Not on file  . Highest education level: Not on file  Occupational History  . Not on file  Tobacco Use  . Smoking status: Never Smoker  . Smokeless tobacco: Never Used  Vaping Use  . Vaping Use: Never used  Substance and Sexual Activity  . Alcohol use: Never  .  Drug use: Not Currently    Types: Marijuana    Comment: Smoked for 30 years  . Sexual activity: Not on file  Other Topics Concern  . Not on file  Social History Narrative  . Not on file   Social Determinants of Health   Financial Resource Strain: Low Risk   . Difficulty of Paying Living Expenses: Not very hard  Food Insecurity: No Food Insecurity  . Worried About Charity fundraiser in the Last Year: Never true  . Ran Out of Food in the Last Year: Never true  Transportation Needs: Unmet Transportation Needs  . Lack of Transportation (Medical): Yes  . Lack of Transportation (Non-Medical): Yes  Physical Activity:   . Days of Exercise per Week: Not on file  . Minutes of Exercise per Session: Not on file  Stress:   .  Feeling of Stress : Not on file  Social Connections:   . Frequency of Communication with Friends and Family: Not on file  . Frequency of Social Gatherings with Friends and Family: Not on file  . Attends Religious Services: Not on file  . Active Member of Clubs or Organizations: Not on file  . Attends Archivist Meetings: Not on file  . Marital Status: Not on file     Family History: The patient's family history includes CAD in his maternal grandfather and maternal grandmother; Hyperlipidemia in his mother; Hypertension in his mother; Stroke in his mother.  ROS:   Review of Systems  Constitution: Negative for decreased appetite, fever and weight gain.  HENT: Negative for congestion, ear discharge, hoarse voice and sore throat.   Eyes: Negative for discharge, redness, vision loss in right eye and visual halos.  Cardiovascular: Negative for chest pain, dyspnea on exertion, leg swelling, orthopnea and palpitations.  Respiratory: Negative for cough, hemoptysis, shortness of breath and snoring.   Endocrine: Negative for heat intolerance and polyphagia.  Hematologic/Lymphatic: Negative for bleeding problem. Does not bruise/bleed easily.  Skin: Negative for  flushing, nail changes, rash and suspicious lesions.  Musculoskeletal: Negative for arthritis, joint pain, muscle cramps, myalgias, neck pain and stiffness.  Gastrointestinal: Negative for abdominal pain, bowel incontinence, diarrhea and excessive appetite.  Genitourinary: Negative for decreased libido, genital sores and incomplete emptying.  Neurological: Negative for brief paralysis, focal weakness, headaches and loss of balance.  Psychiatric/Behavioral: Negative for altered mental status, depression and suicidal ideas.  Allergic/Immunologic: Negative for HIV exposure and persistent infections.    EKGs/Labs/Other Studies Reviewed:    The following studies were reviewed today:   EKG: None today  Recent Labs: 12/23/2019: TSH 2.472 01/26/2020: ALT 28 02/08/2020: Hemoglobin 13.0; Platelets 175 02/09/2020: Magnesium 2.0 02/16/2020: B Natriuretic Peptide 33.1; BUN 13; Creatinine, Ser 1.07; Potassium 4.4; Sodium 138  Recent Lipid Panel    Component Value Date/Time   CHOL 130 01/28/2020 0104   CHOL 128 01/24/2019 1121   TRIG 100 01/28/2020 0104   HDL 37 (L) 01/28/2020 0104   HDL 42 01/24/2019 1121   CHOLHDL 3.5 01/28/2020 0104   VLDL 20 01/28/2020 0104   LDLCALC 73 01/28/2020 0104   LDLCALC 64 01/24/2019 1121    Physical Exam:    VS:  BP 116/64   Pulse 75   Ht 6' 10"  (2.083 m)   Wt (!) 372 lb 6.4 oz (168.9 kg)   SpO2 95%   BMI 38.94 kg/m     Wt Readings from Last 3 Encounters:  03/06/20 (!) 372 lb 6.4 oz (168.9 kg)  03/05/20 (!) 369 lb 11.2 oz (167.7 kg)  02/29/20 (!) 367 lb (166.5 kg)     GEN: Well nourished, well developed in no acute distress HEENT: Normal NECK: No JVD; No carotid bruits LYMPHATICS: No lymphadenopathy CARDIAC: S1S2 noted,RRR, no murmurs, rubs, gallops RESPIRATORY:  Clear to auscultation without rales, wheezing or rhonchi  ABDOMEN: Soft, non-tender, non-distended, +bowel sounds, no guarding. EXTREMITIES: +1 bilateral leg edema, No cyanosis, no  clubbing MUSCULOSKELETAL:  No deformity  SKIN: Warm and dry NEUROLOGIC:  Alert and oriented x 3, non-focal PSYCHIATRIC:  Normal affect, good insight  ASSESSMENT:    1. Paroxysmal atrial fibrillation (HCC)   2. Chronic heart failure with preserved ejection fraction ([place])   3. Chronic pulmonary embolism without acute cor pulmonale, unspecified pulmonary embolism type ([place])   4. OSA (obstructive sleep apnea)   5. Benign essential HTN  6. Mixed hyperlipidemia   7. Morbid obesity ([place])    PLAN:     He still does have some leg edema but he tells me that the Bumex has helped him greatly. His only problem is the fatigue. I think that he does need some cardiac rehab given significant deconditioning. He plans to wait until after the holidays to have this all started. The patient does have paramedic nurses coming out to his house once a day he is very happy about this. He also was set up with mom meals by our social services department which is very helpful to the patient and he is thankful for this as well.  No medication changes today he will remain on his current regimen. I plan to get blood work today to assess his kidney function as well as his electrolytes.  The patient is in agreement with the above plan. The patient left the office in stable condition.  The patient will follow up in 3 months or sooner if needed.   Medication Adjustments/Labs and Tests Ordered: Current medicines are reviewed at length with the patient today.  Concerns regarding medicines are outlined above.  Orders Placed This Encounter  Procedures  . Basic metabolic panel  . Magnesium  . CBC   No orders of the defined types were placed in this encounter.   Patient Instructions   Lab Work:  Your physician recommends that you HAVE LAB WORK TODAY  If you have labs (blood work) drawn today and your tests are completely normal, you will receive your results only by: Marland Kitchen MyChart Message (if you have MyChart) OR . A  paper copy in the mail If you have any lab test that is abnormal or we need to change your treatment, we will call you to review the results.   Follow-Up: At [facility], you and your health needs are our priority.  As part of our continuing mission to provide you with exceptional heart care, we have created designated Provider Care Teams.  These Care Teams include your primary Cardiologist (physician) and Advanced Practice Providers (APPs -  Physician Assistants and Nurse Practitioners) who all work together to provide you with the care you need, when you need it.  We recommend signing up for the patient portal called "MyChart".  Sign up information is provided on this After Visit Summary.  MyChart is used to connect with patients for Virtual Visits (Telemedicine).  Patients are able to view lab/test results, encounter notes, upcoming appointments, etc.  Non-urgent messages can be sent to your provider as well.   To learn more about what you can do with MyChart, go to [email or web address].    Your next appointment:   3 month(s)  The format for your next appointment:   In Person  Provider:   Berniece Salines, DO       Adopting a Healthy Lifestyle.  Know what a healthy weight is for you (roughly BMI <25) and aim to maintain this   Aim for 7+ servings of fruits and vegetables daily   65-80+ fluid ounces of water or unsweet tea for healthy kidneys   Limit to max 1 drink of alcohol per day; avoid smoking/tobacco   Limit animal fats in diet for cholesterol and heart health - choose grass fed whenever available   Avoid highly processed foods, and foods high in saturated/trans fats   Aim for low stress - take time to unwind and care for your mental health   Aim for 150 min of  moderate intensity exercise weekly for heart health, and weights twice weekly for bone health   Aim for 7-9 hours of sleep daily   When it comes to diets, agreement about the perfect plan isnt easy to find,  even among the experts. Experts at the [employer] developed an idea known as the Healthy Eating Plate. Just imagine a plate divided into logical, healthy portions.   The emphasis is on diet quality:   Load up on vegetables and fruits - one-half of your plate: Aim for color and variety, and remember that potatoes dont count.   Go for whole grains - one-quarter of your plate: Whole wheat, barley, wheat berries, quinoa, oats, brown rice, and foods made with them. If you want pasta, go with whole wheat pasta.   Protein power - one-quarter of your plate: Fish, chicken, beans, and nuts are all healthy, versatile protein sources. Limit red meat.   The diet, however, does go beyond the plate, offering a few other suggestions.   Use healthy plant oils, such as olive, canola, soy, corn, sunflower and peanut. Check the labels, and avoid partially hydrogenated oil, which have unhealthy trans fats.   If youre thirsty, drink water. Coffee and tea are good in moderation, but skip sugary drinks and limit milk and dairy products to one or two daily servings.   The type of carbohydrate in the diet is more important than the amount. Some sources of carbohydrates, such as vegetables, fruits, whole grains, and beans-are healthier than others.   Finally, stay active  Signed, Berniece Salines, DO  03/06/2020 3:48 PM    [facility]

## 2020-03-06 NOTE — Telephone Encounter (Signed)
Order placed

## 2020-03-06 NOTE — Patient Instructions (Signed)
°  Lab Work:  Your physician recommends that you HAVE LAB WORK TODAY  If you have labs (blood work) drawn today and your tests are completely normal, you will receive your results only by:  MyChart Message (if you have MyChart) OR  A paper copy in the mail If you have any lab test that is abnormal or we need to change your treatment, we will call you to review the results.   Follow-Up: At [facility], you and your health needs are our priority.  As part of our continuing mission to provide you with exceptional heart care, we have created designated Provider Care Teams.  These Care Teams include your primary Cardiologist (physician) and Advanced Practice Providers (APPs -  Physician Assistants and Nurse Practitioners) who all work together to provide you with the care you need, when you need it.  We recommend signing up for the patient portal called "MyChart".  Sign up information is provided on this After Visit Summary.  MyChart is used to connect with patients for Virtual Visits (Telemedicine).  Patients are able to view lab/test results, encounter notes, upcoming appointments, etc.  Non-urgent messages can be sent to your provider as well.   To learn more about what you can do with MyChart, go to [email or web address].    Your next appointment:   3 month(s)  The format for your next appointment:   In Person  Provider:   Berniece Salines, DO

## 2020-03-07 LAB — BASIC METABOLIC PANEL
BUN/Creatinine Ratio: 18 (ref 10–24)
BUN: 20 mg/dL (ref 8–27)
CO2: 27 mmol/L (ref 20–29)
Calcium: 9.3 mg/dL (ref 8.6–10.2)
Chloride: 97 mmol/L (ref 96–106)
Creatinine, Ser: 1.1 mg/dL (ref 0.76–1.27)
GFR calc Af Amer: 82 mL/min/{1.73_m2} (ref >59)
GFR calc non Af Amer: 71 mL/min/{1.73_m2} (ref >59)
Glucose: 96 mg/dL (ref 65–99)
Potassium: 5.3 mmol/L — ABNORMAL HIGH (ref 3.5–5.2)
Sodium: 135 mmol/L (ref 134–144)

## 2020-03-07 LAB — CBC
Hematocrit: 40.4 % (ref 37.5–51.0)
Hemoglobin: 13 g/dL (ref 13.0–17.7)
MCH: 25.4 pg — ABNORMAL LOW (ref 26.6–33.0)
MCHC: 32.2 g/dL (ref 31.5–35.7)
MCV: 79 fL (ref 79–97)
Platelets: 179 10*3/uL (ref 150–450)
RBC: 5.11 x10E6/uL (ref 4.14–5.80)
RDW: 15.7 % — ABNORMAL HIGH (ref 11.6–15.4)
WBC: 10.2 10*3/uL (ref 3.4–10.8)

## 2020-03-07 LAB — MAGNESIUM: Magnesium: 2.5 mg/dL — ABNORMAL HIGH (ref 1.6–2.3)

## 2020-03-11 ENCOUNTER — Telehealth: Payer: Self-pay

## 2020-03-11 NOTE — Telephone Encounter (Signed)
Spoke with patient regarding results and recommendation.  Patient verbalizes understanding and is agreeable to plan of care. Advised patient to call back with any issues or concerns.  

## 2020-03-11 NOTE — Telephone Encounter (Signed)
-----   Message from Berniece Salines, DO sent at 03/11/2020  8:28 AM EST ----- He is hyperkalemic, please asked him to hold his potassium for today, and then take 40 mEq twice daily not 4 times daily.

## 2020-03-12 ENCOUNTER — Telehealth (HOSPITAL_COMMUNITY): Payer: Self-pay | Admitting: *Deleted

## 2020-03-12 ENCOUNTER — Other Ambulatory Visit (HOSPITAL_COMMUNITY): Payer: Self-pay

## 2020-03-12 MED ORDER — METOLAZONE 2.5 MG PO TABS: ORAL_TABLET | ORAL | 3 refills | Status: DC

## 2020-03-12 NOTE — Telephone Encounter (Signed)
Heather w/paramedicine called to report pts 5lb weight gain x 1week and  +3 BLEE. Pt c/o fatigue and more shortness of breath. Pt is taking all medication as prescribed. Per Dr.McLean take metolazone 2.93m today with next dose of bumex and 2.5632mof metolazone with tomorrows AM dose of metolazone after that take 2.32m52mf metolazone weekly on Tuesdays and get a BMET next week. [facility] and either her or HeaNira Connll draw bmet at patients home next week.

## 2020-03-12 NOTE — Progress Notes (Signed)
Paramedicine Encounter    Patient ID: Charles Gray, male    DOB: 12-24-1955, 64 y.o.   MRN: [id number]   Patient Care Team: Enid Skeens., MD as PCP - General (Family Medicine) Berniece Salines, DO as PCP - Cardiology (Cardiology)  Patient Active Problem List   Diagnosis Date Noted  . Acute on chronic right heart failure ([place]) 01/27/2020  . Acute respiratory failure with hypoxia ([place])   . Fatty liver disease, nonalcoholic   . Shock ([place])   . Personal history of DVT (deep vein thrombosis) 12/26/2019  . Chronic anticoagulation 12/26/2019  . Palpitations 12/26/2019  . Atrial fibrillation ([place]) 12/23/2019  . Heart failure, systolic, acute ([place]) 59/93/5701  . Acute on chronic congestive heart failure ([place])   . Acute on chronic diastolic CHF (congestive heart failure) ([place]) 08/23/2019  . Redness and swelling of lower leg 08/23/2019  . Chest pain 08/23/2019  . Lactic acidosis 08/23/2019  . History of pulmonary embolism 08/12/2019  . Atrial tachycardia ([place]) 08/12/2019  . Occasional tremors 08/12/2019  . OSA (obstructive sleep apnea) 08/12/2019  . Acute on chronic right-sided heart failure ([place]) 08/06/2019  . Pulmonary embolism ([place]) 06/08/2019  . Normocytic anemia 06/08/2019  . Pulmonary emboli ([place]) 06/08/2019  . Chronic heart failure with preserved ejection fraction ([place]) 05/30/2019  . Class 2 severe obesity due to excess calories with serious comorbidity and body mass index (BMI) of 39.0 to 39.9 in adult ([place]) 05/30/2019  . Morbid obesity with BMI of 40.0-44.9, adult ([place]) 05/30/2019  . Hyponatremia   . Benign essential HTN   . Drug induced constipation   . Peripheral edema   . Hypotension due to drugs   . Hypoalbuminemia due to protein-calorie malnutrition ([place])   . Hypokalemia   . Thrombocytopenia ([place])   . Anemia of chronic disease   . Cirrhosis of liver without ascites ([place])   . Chronic pain syndrome   . Debility 03/13/2019  . Portal hypertensive gastropathy ([place])  03/06/2019  . Dyspnea   . GIB (gastrointestinal bleeding) 03/04/2019  . Mixed hyperlipidemia 01/24/2019  . Insomnia 01/24/2019  . Hyperlipidemia 01/24/2019  . Hypertension 01/24/2019  . Lumbar disc herniation 01/24/2019    Current Outpatient Medications:  .  acetaminophen (TYLENOL) 325 MG tablet, Take 2 tablets (650 mg total) by mouth every 4 (four) hours as needed for fever or mild pain., Disp:  , Rfl:  .  apixaban (ELIQUIS) 5 MG TABS tablet, Take 1 tablet (5 mg total) by mouth 2 (two) times daily., Disp: 60 tablet, Rfl: 0 .  bumetanide (BUMEX) 1 MG tablet, Take 3 tablets (3 mg total) by mouth 2 (two) times daily., Disp: 180 tablet, Rfl: 3 .  Buprenorphine HCl-Naloxone HCl 8-2 MG FILM, Place 1 tablet under the tongue in the morning and at bedtime. , Disp: , Rfl:  .  eplerenone (INSPRA) 50 MG tablet, Take 2 tablets (100 mg total) by mouth daily., Disp: 60 tablet, Rfl: 3 .  gabapentin (NEURONTIN) 800 MG tablet, Take 1 tablet (800 mg total) by mouth 3 (three) times daily. (Patient taking differently: Take by mouth 3 (three) times daily. ), Disp: 90 tablet, Rfl: 1 .  losartan (COZAAR) 25 MG tablet, Take 0.5 tablets (12.5 mg total) by mouth at bedtime., Disp: 30 tablet, Rfl: 3 .  metoprolol tartrate (LOPRESSOR) 50 MG tablet, Take 1 tablet (50 mg total) by mouth 2 (two) times daily., Disp: 180 tablet, Rfl: 1 .  pantoprazole (PROTONIX) 40 MG tablet, Take 40 mg by mouth 2 (  two) times daily., Disp: , Rfl:  .  polyethylene glycol (MIRALAX / GLYCOLAX) 17 g packet, Take 17 g by mouth daily. (Patient taking differently: Take 17 g by mouth daily as needed for mild constipation. ), Disp: 14 each, Rfl: 0 .  potassium chloride SA (KLOR-CON) 20 MEQ tablet, Take 40 mEq by mouth in the morning and at bedtime., Disp: , Rfl:  .  pravastatin (PRAVACHOL) 20 MG tablet, Take 1 tablet (20 mg total) by mouth daily., Disp: 30 tablet, Rfl: 1 .  VENTOLIN HFA 108 (90 Base) MCG/ACT inhaler, Inhale 1 puff into the lungs every  4 (four) hours as needed for shortness of breath., Disp: 6.7 g, Rfl: 1 .  zolpidem (AMBIEN) 10 MG tablet, Take 10 mg by mouth at bedtime as needed for sleep. , Disp: , Rfl:  Allergies  Allergen Reactions  . Penicillins     Did it involve swelling of the face/tongue/throat, SOB, or low BP? N/A Did it involve sudden or severe rash/hives, skin peeling, or any reaction on the inside of your mouth or nose? N/A Did you need to seek medical attention at a hospital or doctor's office?N/A When did it last happen?N/A If all above answers are "NO", may proceed with cephalosporin use.     Social History   Socioeconomic History  . Marital status: Single    Spouse name: Not on file  . Number of children: Not on file  . Years of education: Not on file  . Highest education level: Not on file  Occupational History  . Not on file  Tobacco Use  . Smoking status: Never Smoker  . Smokeless tobacco: Never Used  Vaping Use  . Vaping Use: Never used  Substance and Sexual Activity  . Alcohol use: Never  . Drug use: Not Currently    Types: Marijuana    Comment: Smoked for 30 years  . Sexual activity: Not on file  Other Topics Concern  . Not on file  Social History Narrative  . Not on file   Social Determinants of Health   Financial Resource Strain: Low Risk   . Difficulty of Paying Living Expenses: Not very hard  Food Insecurity: No Food Insecurity  . Worried About Charity fundraiser in the Last Year: Never true  . Ran Out of Food in the Last Year: Never true  Transportation Needs: Unmet Transportation Needs  . Lack of Transportation (Medical): Yes  . Lack of Transportation (Non-Medical): Yes  Physical Activity:   . Days of Exercise per Week: Not on file  . Minutes of Exercise per Session: Not on file  Stress:   . Feeling of Stress : Not on file  Social Connections:   . Frequency of Communication with Friends and Family: Not on file  . Frequency of Social Gatherings with Friends and  Family: Not on file  . Attends Religious Services: Not on file  . Active Member of Clubs or Organizations: Not on file  . Attends Archivist Meetings: Not on file  . Marital Status: Not on file  Intimate Partner Violence:   . Fear of Current or Ex-Partner: Not on file  . Emotionally Abused: Not on file  . Physically Abused: Not on file  . Sexually Abused: Not on file    Physical Exam Vitals reviewed.  HENT:     Head: Normocephalic.     Nose: Nose normal.     Mouth/Throat:     Mouth: Mucous membranes are moist.  Pharynx: Oropharynx is clear.  Eyes:     Pupils: Pupils are equal, round, and reactive to light.  Cardiovascular:     Rate and Rhythm: Normal rate and regular rhythm.     Pulses: Normal pulses.  Pulmonary:     Effort: Respiratory distress present.     Breath sounds: Normal breath sounds.  Abdominal:     General: Abdomen is flat.     Palpations: Abdomen is soft.  Musculoskeletal:        General: Swelling present. Normal range of motion.     Cervical back: Normal range of motion.     Right lower leg: Edema present.     Left lower leg: Edema present.  Skin:    General: Skin is warm and dry.     Capillary Refill: Capillary refill takes less than 2 seconds.  Neurological:     General: No focal deficit present.     Mental Status: He is alert. Mental status is at baseline.  Psychiatric:        Mood and Affect: Mood normal.      Arrived for home visit for Charles Gray who was seated on his couch alert but reporting he felt fatigued and short of breath. No obvious signs of distress noted while seated, however upon walking to the bathroom and back he was very short of breath. Charles Gray has had a 5 lb weight gain since last week with only a 1 lb weight gain over night. I obtained vitals and they are as noted. Lower bilateral leg +3 edema.  Charles Gray has been compliant with medications over the last week. I called HF clinic triage and Charles Gray spoke to Dr. [name] who  ordered Charles Gray take 2.8m Metolazone today and tomorrow and weekly on Tuesdays after that. I obtained Metolazone from the clinic and gave Charles Gray his first dose and placed next tomorrow's dose in his pill box. Charles Gray agreed and understood instructions. I will obtain labs on Charles Gray next week per Charles Gray's order.    Charles Gray was assisted with [employer] Corporationpaperwork today as well as assisted in creating an oTEFL Gray and paying power bill. I will assist in obtaining SSI award letter for Charles Gray fax over to the housing authority. Charles Gray was grateful for this help.   I will see Charles Gray in one week in the home, he was instructed to reach out if he had any issues or any new or worsening symptoms.  Home visit complete.   Refills- NONE   Future Appointments  Date Time Provider [place] 03/19/2020  2:40 PM TSueanne Margarita MD [facility]  04/23/2020 10:30 AM [facility] PA/NP [facility] None  06/06/2020  2:20 PM Tobb, KGodfrey Pick DO [facility] None     ACTION: Home visit completed Next visit planned for one week

## 2020-03-14 ENCOUNTER — Telehealth (HOSPITAL_COMMUNITY): Payer: Self-pay | Admitting: Licensed Clinical Social Worker

## 2020-03-14 NOTE — Telephone Encounter (Signed)
Clinical biochemist working with pt on housing application but pt needing awards statement from [employer] to send in as proof of income- pt does not feel comfortable obtaining himself.  CSW called [employer] with pt to request- they are unwilling to fax to me but will mail to patient- patient will provide to paramedic once it arrives  Will continue to follow and assist as needed  Jorge Ny, [facility] Desk#: [phone] Cell#: [phone]

## 2020-03-18 ENCOUNTER — Telehealth (HOSPITAL_COMMUNITY): Payer: Self-pay

## 2020-03-18 ENCOUNTER — Telehealth (HOSPITAL_COMMUNITY): Payer: Self-pay | Admitting: *Deleted

## 2020-03-18 NOTE — Telephone Encounter (Signed)
Received a call from Mitsugi this morning stating he was very anxious, his legs were swollen, his weight was up and he was short of breath. I informed PA Ellen Henri of same and she reports she would wait for med changes until we drew labs tomorrow which I will be doing in the home tomorrow morning. I will also preform a walking test with Hunt tomorrow in the home to see how his breathing levels are while ambulating. [place] also ordered leg wraps through his [facility] provider [facility]. I will attempt to follow up with [facility] RN, as she has not returned my calls. I will see Amarie in the home tomorrow.

## 2020-03-18 NOTE — Telephone Encounter (Signed)
Order for [employer] (per [employer]) [facility]ed [facility].

## 2020-03-19 ENCOUNTER — Encounter: Payer: Medicare HMO | Admitting: Cardiology

## 2020-03-19 ENCOUNTER — Telehealth (HOSPITAL_COMMUNITY): Payer: Self-pay | Admitting: Licensed Clinical Social Worker

## 2020-03-19 ENCOUNTER — Encounter: Payer: Self-pay | Admitting: Cardiology

## 2020-03-19 ENCOUNTER — Other Ambulatory Visit (HOSPITAL_COMMUNITY): Payer: Self-pay

## 2020-03-19 ENCOUNTER — Other Ambulatory Visit (HOSPITAL_COMMUNITY): Payer: Self-pay | Admitting: Cardiology

## 2020-03-19 ENCOUNTER — Other Ambulatory Visit: Payer: Self-pay

## 2020-03-19 DIAGNOSIS — I5032 Chronic diastolic (congestive) heart failure: Secondary | ICD-10-CM

## 2020-03-19 LAB — BASIC METABOLIC PANEL
Anion gap: 11 (ref 5–15)
BUN: 13 mg/dL (ref 8–23)
CO2: 29 mmol/L (ref 22–32)
Calcium: 9.1 mg/dL (ref 8.9–10.3)
Chloride: 96 mmol/L — ABNORMAL LOW (ref 98–111)
Creatinine, Ser: 0.9 mg/dL (ref 0.61–1.24)
GFR, Estimated: >60 mL/min (ref >60)
Glucose, Bld: 116 mg/dL — ABNORMAL HIGH (ref 70–99)
Potassium: 4 mmol/L (ref 3.5–5.1)
Sodium: 136 mmol/L (ref 135–145)

## 2020-03-19 NOTE — Telephone Encounter (Signed)
Paramedic brought in paperwork for pt Annual Re certification with [employer].  CSW confirmed paperwork is complete and faxed in to [place] for review.  Will continue to follow and assist as needed  Jorge Ny, [facility] Desk#: [phone] Cell#: [phone]

## 2020-03-19 NOTE — Progress Notes (Signed)
This encounter was created in error - please disregard.

## 2020-03-19 NOTE — Progress Notes (Signed)
Paramedicine Encounter    Patient ID: Charles Gray, male    DOB: April 10, 1956, 64 y.o.   MRN: [id number]   Patient Care Team: Enid Skeens., MD as PCP - General (Family Medicine) Berniece Salines, DO as PCP - Cardiology (Cardiology)  Patient Active Problem List   Diagnosis Date Noted  . Acute on chronic right heart failure ([place]) 01/27/2020  . Acute respiratory failure with hypoxia ([place])   . Fatty liver disease, nonalcoholic   . Shock ([place])   . Personal history of DVT (deep vein thrombosis) 12/26/2019  . Chronic anticoagulation 12/26/2019  . Palpitations 12/26/2019  . Atrial fibrillation ([place]) 12/23/2019  . Heart failure, systolic, acute ([place]) 28/36/6294  . Acute on chronic congestive heart failure ([place])   . Acute on chronic diastolic CHF (congestive heart failure) ([place]) 08/23/2019  . Redness and swelling of lower leg 08/23/2019  . Chest pain 08/23/2019  . Lactic acidosis 08/23/2019  . History of pulmonary embolism 08/12/2019  . Atrial tachycardia ([place]) 08/12/2019  . Occasional tremors 08/12/2019  . OSA (obstructive sleep apnea) 08/12/2019  . Acute on chronic right-sided heart failure ([place]) 08/06/2019  . Pulmonary embolism ([place]) 06/08/2019  . Normocytic anemia 06/08/2019  . Pulmonary emboli ([place]) 06/08/2019  . Chronic heart failure with preserved ejection fraction ([place]) 05/30/2019  . Class 2 severe obesity due to excess calories with serious comorbidity and body mass index (BMI) of 39.0 to 39.9 in adult ([place]) 05/30/2019  . Morbid obesity with BMI of 40.0-44.9, adult ([place]) 05/30/2019  . Hyponatremia   . Benign essential HTN   . Drug induced constipation   . Peripheral edema   . Hypotension due to drugs   . Hypoalbuminemia due to protein-calorie malnutrition ([place])   . Hypokalemia   . Thrombocytopenia ([place])   . Anemia of chronic disease   . Cirrhosis of liver without ascites ([place])   . Chronic pain syndrome   . Debility 03/13/2019  . Portal hypertensive gastropathy ([place])  03/06/2019  . Dyspnea   . GIB (gastrointestinal bleeding) 03/04/2019  . Mixed hyperlipidemia 01/24/2019  . Insomnia 01/24/2019  . Hyperlipidemia 01/24/2019  . Hypertension 01/24/2019  . Lumbar disc herniation 01/24/2019    Current Outpatient Medications:  .  acetaminophen (TYLENOL) 325 MG tablet, Take 2 tablets (650 mg total) by mouth every 4 (four) hours as needed for fever or mild pain., Disp:  , Rfl:  .  apixaban (ELIQUIS) 5 MG TABS tablet, Take 1 tablet (5 mg total) by mouth 2 (two) times daily., Disp: 60 tablet, Rfl: 0 .  bumetanide (BUMEX) 1 MG tablet, Take 3 tablets (3 mg total) by mouth 2 (two) times daily., Disp: 180 tablet, Rfl: 3 .  Buprenorphine HCl-Naloxone HCl 8-2 MG FILM, Place 1 tablet under the tongue in the morning and at bedtime. , Disp: , Rfl:  .  eplerenone (INSPRA) 50 MG tablet, Take 2 tablets (100 mg total) by mouth daily., Disp: 60 tablet, Rfl: 3 .  gabapentin (NEURONTIN) 800 MG tablet, Take 1 tablet (800 mg total) by mouth 3 (three) times daily. (Patient taking differently: Take by mouth 3 (three) times daily. ), Disp: 90 tablet, Rfl: 1 .  losartan (COZAAR) 25 MG tablet, Take 0.5 tablets (12.5 mg total) by mouth at bedtime. (Patient not taking: Reported on 03/12/2020), Disp: 30 tablet, Rfl: 3 .  metolazone (ZAROXOLYN) 2.5 MG tablet, Take 1 tablet weekly on Tuesdays., Disp: 15 tablet, Rfl: 3 .  metoprolol tartrate (LOPRESSOR) 50 MG tablet, Take 1 tablet (50 mg total)  by mouth 2 (two) times daily., Disp: 180 tablet, Rfl: 1 .  pantoprazole (PROTONIX) 40 MG tablet, Take 40 mg by mouth 2 (two) times daily., Disp: , Rfl:  .  polyethylene glycol (MIRALAX / GLYCOLAX) 17 g packet, Take 17 g by mouth daily. (Patient not taking: Reported on 03/12/2020), Disp: 14 each, Rfl: 0 .  potassium chloride SA (KLOR-CON) 20 MEQ tablet, Take 40 mEq by mouth in the morning and at bedtime., Disp: , Rfl:  .  pravastatin (PRAVACHOL) 20 MG tablet, Take 1 tablet (20 mg total) by mouth daily.,  Disp: 30 tablet, Rfl: 1 .  VENTOLIN HFA 108 (90 Base) MCG/ACT inhaler, Inhale 1 puff into the lungs every 4 (four) hours as needed for shortness of breath., Disp: 6.7 g, Rfl: 1 .  zolpidem (AMBIEN) 10 MG tablet, Take 10 mg by mouth at bedtime as needed for sleep. , Disp: , Rfl:  Allergies  Allergen Reactions  . Penicillins     Did it involve swelling of the face/tongue/throat, SOB, or low BP? N/A Did it involve sudden or severe rash/hives, skin peeling, or any reaction on the inside of your mouth or nose? N/A Did you need to seek medical attention at a hospital or doctor's office?N/A When did it last happen?N/A If all above answers are "NO", may proceed with cephalosporin use.     Social History   Socioeconomic History  . Marital status: Single    Spouse name: Not on file  . Number of children: Not on file  . Years of education: Not on file  . Highest education level: Not on file  Occupational History  . Not on file  Tobacco Use  . Smoking status: Never Smoker  . Smokeless tobacco: Never Used  Vaping Use  . Vaping Use: Never used  Substance and Sexual Activity  . Alcohol use: Never  . Drug use: Not Currently    Types: Marijuana    Comment: Smoked for 30 years  . Sexual activity: Not on file  Other Topics Concern  . Not on file  Social History Narrative  . Not on file   Social Determinants of Health   Financial Resource Strain: Low Risk   . Difficulty of Paying Living Expenses: Not very hard  Food Insecurity: No Food Insecurity  . Worried About Charity fundraiser in the Last Year: Never true  . Ran Out of Food in the Last Year: Never true  Transportation Needs: Unmet Transportation Needs  . Lack of Transportation (Medical): Yes  . Lack of Transportation (Non-Medical): Yes  Physical Activity:   . Days of Exercise per Week: Not on file  . Minutes of Exercise per Session: Not on file  Stress:   . Feeling of Stress : Not on file  Social Connections:   . Frequency  of Communication with Friends and Family: Not on file  . Frequency of Social Gatherings with Friends and Family: Not on file  . Attends Religious Services: Not on file  . Active Member of Clubs or Organizations: Not on file  . Attends Archivist Meetings: Not on file  . Marital Status: Not on file  Intimate Partner Violence:   . Fear of Current or Ex-Partner: Not on file  . Emotionally Abused: Not on file  . Physically Abused: Not on file  . Sexually Abused: Not on file    Physical Exam Vitals reviewed.  Constitutional:      Appearance: Normal appearance. He is normal weight.  HENT:  Head: Normocephalic.     Nose: Nose normal.     Mouth/Throat:     Mouth: Mucous membranes are moist.     Pharynx: Oropharynx is clear.  Eyes:     Pupils: Pupils are equal, round, and reactive to light.  Cardiovascular:     Rate and Rhythm: Normal rate and regular rhythm.     Pulses: Normal pulses.     Heart sounds: Normal heart sounds.  Pulmonary:     Effort: Pulmonary effort is normal.     Breath sounds: Normal breath sounds.  Abdominal:     Palpations: Abdomen is soft.  Musculoskeletal:        General: Swelling present. Normal range of motion.     Right lower leg: Edema present.     Left lower leg: Edema present.  Skin:    General: Skin is warm and dry.     Capillary Refill: Capillary refill takes less than 2 seconds.  Neurological:     General: No focal deficit present.     Mental Status: He is alert. Mental status is at baseline.  Psychiatric:        Mood and Affect: Mood normal.    -Picked up prescriptions from [place] for Ausencio.    Arrived for home visit where Malichi was alert and oriented seated in his living room but reported he has been feeling really poorly. Xavier had increased edema and swelling in his lower legs bilaterally. Neither were warm to the touch or proving signs of infection. [name]'s vitals were obtained as noted. Caedan had no outward signs of  respiratory distress. Lung sounds were clear. Medications were verified. Pill box filled accordingly. I also obtained labs on [place] BMET and BNP. Vermon also provided his Clinical biochemist in which I brought to clinic as well as labs. Monnie was reminded of appointment with Turner today and he said he will have to reschedule it. I advised the patient to call and cancel and reschedule himself. He agreed. Home visit complete. I will see Akira in one week depending on labs. Chaddrick agreed and understood.   Refills: NONE    Future Appointments  Date Time Provider [place]  03/19/2020  2:40 PM Sueanne Margarita, MD [facility]  04/23/2020 10:30 AM [facility] PA/NP [facility] None  06/06/2020  2:20 PM Tobb, Godfrey Pick, DO [facility] None     ACTION: Home visit completed Next visit planned for one week

## 2020-03-22 ENCOUNTER — Other Ambulatory Visit: Payer: Self-pay

## 2020-03-22 ENCOUNTER — Telehealth: Payer: Medicare HMO | Admitting: Cardiology

## 2020-03-24 ENCOUNTER — Other Ambulatory Visit: Payer: Self-pay

## 2020-03-24 ENCOUNTER — Inpatient Hospital Stay (HOSPITAL_COMMUNITY)
Admission: EM | Admit: 2020-03-24 | Discharge: 2020-03-27 | DRG: 291 | Disposition: A | Discharge status: Home Health | Payer: Medicare Other | Attending: Cardiology | Admitting: Cardiology

## 2020-03-24 DIAGNOSIS — Z8249 Family history of ischemic heart disease and other diseases of the circulatory system: Secondary | ICD-10-CM

## 2020-03-24 DIAGNOSIS — Z880 Allergy status to penicillin: Secondary | ICD-10-CM

## 2020-03-24 DIAGNOSIS — G894 Chronic pain syndrome: Secondary | ICD-10-CM | POA: Diagnosis not present

## 2020-03-24 DIAGNOSIS — Z9119 Patient's noncompliance with other medical treatment and regimen: Secondary | ICD-10-CM

## 2020-03-24 DIAGNOSIS — E782 Mixed hyperlipidemia: Secondary | ICD-10-CM | POA: Diagnosis not present

## 2020-03-24 DIAGNOSIS — Z6841 Body Mass Index (BMI) 40.0 and over, adult: Secondary | ICD-10-CM

## 2020-03-24 DIAGNOSIS — G4733 Obstructive sleep apnea (adult) (pediatric): Secondary | ICD-10-CM

## 2020-03-24 DIAGNOSIS — G4700 Insomnia, unspecified: Secondary | ICD-10-CM | POA: Diagnosis present

## 2020-03-24 DIAGNOSIS — I110 Hypertensive heart disease with heart failure: Principal | ICD-10-CM | POA: Diagnosis present

## 2020-03-24 DIAGNOSIS — K3189 Other diseases of stomach and duodenum: Secondary | ICD-10-CM | POA: Diagnosis present

## 2020-03-24 DIAGNOSIS — Z83438 Family history of other disorder of lipoprotein metabolism and other lipidemia: Secondary | ICD-10-CM

## 2020-03-24 DIAGNOSIS — Z20822 Contact with and (suspected) exposure to covid-19: Secondary | ICD-10-CM | POA: Diagnosis present

## 2020-03-24 DIAGNOSIS — I2510 Atherosclerotic heart disease of native coronary artery without angina pectoris: Secondary | ICD-10-CM | POA: Diagnosis not present

## 2020-03-24 DIAGNOSIS — Z86711 Personal history of pulmonary embolism: Secondary | ICD-10-CM | POA: Diagnosis not present

## 2020-03-24 DIAGNOSIS — Z79899 Other long term (current) drug therapy: Secondary | ICD-10-CM

## 2020-03-24 DIAGNOSIS — I5033 Acute on chronic diastolic (congestive) heart failure: Secondary | ICD-10-CM

## 2020-03-24 DIAGNOSIS — I4891 Unspecified atrial fibrillation: Secondary | ICD-10-CM | POA: Diagnosis present

## 2020-03-24 DIAGNOSIS — R0789 Other chest pain: Secondary | ICD-10-CM | POA: Diagnosis not present

## 2020-03-24 DIAGNOSIS — K7581 Nonalcoholic steatohepatitis (NASH): Secondary | ICD-10-CM | POA: Diagnosis present

## 2020-03-24 DIAGNOSIS — Z823 Family history of stroke: Secondary | ICD-10-CM

## 2020-03-24 DIAGNOSIS — R0902 Hypoxemia: Secondary | ICD-10-CM | POA: Diagnosis not present

## 2020-03-24 DIAGNOSIS — Z86718 Personal history of other venous thrombosis and embolism: Secondary | ICD-10-CM

## 2020-03-24 DIAGNOSIS — K766 Portal hypertension: Secondary | ICD-10-CM | POA: Diagnosis present

## 2020-03-24 DIAGNOSIS — I5043 Acute on chronic combined systolic (congestive) and diastolic (congestive) heart failure: Secondary | ICD-10-CM | POA: Diagnosis present

## 2020-03-24 DIAGNOSIS — R002 Palpitations: Secondary | ICD-10-CM | POA: Diagnosis present

## 2020-03-24 DIAGNOSIS — J449 Chronic obstructive pulmonary disease, unspecified: Secondary | ICD-10-CM | POA: Diagnosis not present

## 2020-03-24 DIAGNOSIS — K7460 Unspecified cirrhosis of liver: Secondary | ICD-10-CM | POA: Diagnosis present

## 2020-03-24 DIAGNOSIS — I480 Paroxysmal atrial fibrillation: Secondary | ICD-10-CM | POA: Diagnosis not present

## 2020-03-24 DIAGNOSIS — R000 Tachycardia, unspecified: Secondary | ICD-10-CM | POA: Diagnosis not present

## 2020-03-24 DIAGNOSIS — Z7901 Long term (current) use of anticoagulants: Secondary | ICD-10-CM

## 2020-03-24 DIAGNOSIS — Z6839 Body mass index (BMI) 39.0-39.9, adult: Secondary | ICD-10-CM

## 2020-03-24 DIAGNOSIS — R079 Chest pain, unspecified: Secondary | ICD-10-CM | POA: Diagnosis not present

## 2020-03-24 LAB — CBC WITH DIFFERENTIAL/PLATELET
Abs Immature Granulocytes: 0.02 10*3/uL (ref 0.00–0.07)
Basophils Absolute: 0 10*3/uL (ref 0.0–0.1)
Basophils Relative: 0 %
Eosinophils Absolute: 0.1 10*3/uL (ref 0.0–0.5)
Eosinophils Relative: 1 %
HCT: 42.6 % (ref 39.0–52.0)
Hemoglobin: 12.4 g/dL — ABNORMAL LOW (ref 13.0–17.0)
Immature Granulocytes: 0 %
Lymphocytes Relative: 14 %
Lymphs Abs: 1.2 10*3/uL (ref 0.7–4.0)
MCH: 24.7 pg — ABNORMAL LOW (ref 26.0–34.0)
MCHC: 29.1 g/dL — ABNORMAL LOW (ref 30.0–36.0)
MCV: 84.7 fL (ref 80.0–100.0)
Monocytes Absolute: 0.7 10*3/uL (ref 0.1–1.0)
Monocytes Relative: 9 %
Neutro Abs: 6.4 10*3/uL (ref 1.7–7.7)
Neutrophils Relative %: 76 %
Platelets: 167 10*3/uL (ref 150–400)
RBC: 5.03 MIL/uL (ref 4.22–5.81)
RDW: 15.7 % — ABNORMAL HIGH (ref 11.5–15.5)
WBC: 8.5 10*3/uL (ref 4.0–10.5)
nRBC: 0 % (ref 0.0–0.2)

## 2020-03-24 LAB — MAGNESIUM: Magnesium: 2.1 mg/dL (ref 1.7–2.4)

## 2020-03-24 LAB — BASIC METABOLIC PANEL
Anion gap: 13 (ref 5–15)
BUN: 15 mg/dL (ref 8–23)
CO2: 24 mmol/L (ref 22–32)
Calcium: 8.9 mg/dL (ref 8.9–10.3)
Chloride: 100 mmol/L (ref 98–111)
Creatinine, Ser: 0.98 mg/dL (ref 0.61–1.24)
GFR, Estimated: >60 mL/min (ref >60)
Glucose, Bld: 107 mg/dL — ABNORMAL HIGH (ref 70–99)
Potassium: 4.3 mmol/L (ref 3.5–5.1)
Sodium: 137 mmol/L (ref 135–145)

## 2020-03-24 LAB — RESP PANEL BY RT-PCR (FLU A&B, COVID) ARPGX2
Influenza A by PCR: NEGATIVE
Influenza B by PCR: NEGATIVE
SARS Coronavirus 2 by RT PCR: NEGATIVE

## 2020-03-24 LAB — BRAIN NATRIURETIC PEPTIDE: B Natriuretic Peptide: 299.9 pg/mL — ABNORMAL HIGH (ref 0.0–100.0)

## 2020-03-24 LAB — TROPONIN I (HIGH SENSITIVITY)
Troponin I (High Sensitivity): 7 ng/L (ref <18)
Troponin I (High Sensitivity): 7 ng/L (ref <18)

## 2020-03-24 MED ORDER — SPIRONOLACTONE 25 MG PO TABS: 50.0000 mg | ORAL_TABLET | Freq: Every day | ORAL | Status: DC

## 2020-03-24 MED ORDER — APIXABAN 5 MG PO TABS
5.0000 mg | ORAL_TABLET | Freq: Two times a day (BID) | ORAL | Status: DC
  Administered 2020-03-24 – 2020-03-27 (×6): 5 mg via ORAL
  Filled 2020-03-24 (×6): qty 1

## 2020-03-24 MED ORDER — SODIUM CHLORIDE 0.9% FLUSH
3.0000 mL | Freq: Two times a day (BID) | INTRAVENOUS | Status: DC
  Administered 2020-03-24 – 2020-03-27 (×3): 3 mL via INTRAVENOUS

## 2020-03-24 MED ORDER — FUROSEMIDE 10 MG/ML IJ SOLN
80.0000 mg | Freq: Once | INTRAMUSCULAR | Status: AC
  Administered 2020-03-24: 80 mg via INTRAVENOUS
  Filled 2020-03-24: qty 8

## 2020-03-24 MED ORDER — METOPROLOL TARTRATE 25 MG PO TABS
25.0000 mg | ORAL_TABLET | Freq: Two times a day (BID) | ORAL | Status: DC
  Administered 2020-03-24 – 2020-03-25 (×3): 25 mg via ORAL
  Filled 2020-03-24 (×3): qty 1

## 2020-03-24 MED ORDER — FUROSEMIDE 10 MG/ML IJ SOLN
80.0000 mg | Freq: Once | INTRAMUSCULAR | Status: AC
  Administered 2020-03-24: 18:00:00 80 mg via INTRAVENOUS
  Filled 2020-03-24: qty 8

## 2020-03-24 MED ORDER — FUROSEMIDE 10 MG/ML IJ SOLN
120.0000 mg | Freq: Two times a day (BID) | INTRAVENOUS | Status: DC
  Administered 2020-03-25 (×2): 120 mg via INTRAVENOUS
  Filled 2020-03-24: qty 10
  Filled 2020-03-24 (×2): qty 12

## 2020-03-24 MED ORDER — POTASSIUM CHLORIDE CRYS ER 20 MEQ PO TBCR
40.0000 meq | EXTENDED_RELEASE_TABLET | Freq: Two times a day (BID) | ORAL | Status: DC
  Administered 2020-03-24 – 2020-03-27 (×6): 40 meq via ORAL
  Filled 2020-03-24 (×6): qty 2

## 2020-03-24 MED ORDER — SODIUM CHLORIDE 0.9 % IV SOLN: 250.0000 mL | INTRAVENOUS | Status: DC | PRN

## 2020-03-24 MED ORDER — FUROSEMIDE 10 MG/ML IJ SOLN: 40.0000 mg | Freq: Once | INTRAMUSCULAR | Status: DC

## 2020-03-24 MED ORDER — GABAPENTIN 400 MG PO CAPS
800.0000 mg | ORAL_CAPSULE | Freq: Three times a day (TID) | ORAL | Status: DC
  Administered 2020-03-24 – 2020-03-27 (×8): 800 mg via ORAL
  Filled 2020-03-24 (×8): qty 2

## 2020-03-24 MED ORDER — ACETAMINOPHEN 325 MG PO TABS
650.0000 mg | ORAL_TABLET | ORAL | Status: DC | PRN
  Administered 2020-03-25 – 2020-03-26 (×4): 650 mg via ORAL
  Filled 2020-03-24 (×4): qty 2

## 2020-03-24 MED ORDER — METOLAZONE 5 MG PO TABS
5.0000 mg | ORAL_TABLET | ORAL | Status: AC
  Administered 2020-03-24: 21:00:00 5 mg via ORAL
  Filled 2020-03-24 (×2): qty 1

## 2020-03-24 MED ORDER — PRAVASTATIN SODIUM 10 MG PO TABS
20.0000 mg | ORAL_TABLET | Freq: Every day | ORAL | Status: DC
  Administered 2020-03-25 – 2020-03-27 (×3): 20 mg via ORAL
  Filled 2020-03-24 (×4): qty 2

## 2020-03-24 MED ORDER — ALBUTEROL SULFATE HFA 108 (90 BASE) MCG/ACT IN AERS
1.0000 | INHALATION_SPRAY | RESPIRATORY_TRACT | Status: DC | PRN
  Filled 2020-03-24: qty 6.7

## 2020-03-24 MED ORDER — POLYETHYLENE GLYCOL 3350 17 G PO PACK
17.0000 g | PACK | Freq: Every day | ORAL | Status: DC | PRN
  Administered 2020-03-26: 09:00:00 17 g via ORAL
  Filled 2020-03-24 (×2): qty 1

## 2020-03-24 MED ORDER — ZOLPIDEM TARTRATE 5 MG PO TABS
10.0000 mg | ORAL_TABLET | Freq: Every evening | ORAL | Status: DC | PRN
  Administered 2020-03-24 – 2020-03-26 (×3): 10 mg via ORAL
  Filled 2020-03-24 (×3): qty 2

## 2020-03-24 MED ORDER — DILTIAZEM LOAD VIA INFUSION
20.0000 mg | Freq: Once | INTRAVENOUS | Status: AC
  Administered 2020-03-24: 18:00:00 20 mg via INTRAVENOUS
  Filled 2020-03-24: qty 20

## 2020-03-24 MED ORDER — DILTIAZEM HCL-DEXTROSE 125-5 MG/125ML-% IV SOLN (PREMIX)
5.0000 mg/h | INTRAVENOUS | Status: DC
  Administered 2020-03-24: 18:00:00 5 mg/h via INTRAVENOUS
  Filled 2020-03-24: qty 125

## 2020-03-24 MED ORDER — FLECAINIDE ACETATE 100 MG PO TABS
300.0000 mg | ORAL_TABLET | ORAL | Status: AC
  Administered 2020-03-24: 22:00:00 300 mg via ORAL
  Filled 2020-03-24 (×2): qty 3

## 2020-03-24 MED ORDER — BUPRENORPHINE HCL 8 MG SL SUBL
8.0000 mg | SUBLINGUAL_TABLET | Freq: Two times a day (BID) | SUBLINGUAL | Status: DC
  Filled 2020-03-24: qty 1

## 2020-03-24 MED ORDER — SODIUM CHLORIDE 0.9% FLUSH: 3.0000 mL | INTRAVENOUS | Status: DC | PRN

## 2020-03-24 MED ORDER — ONDANSETRON HCL 4 MG/2ML IJ SOLN: 4.0000 mg | Freq: Every day | INTRAMUSCULAR | Status: DC | PRN

## 2020-03-24 MED ORDER — PANTOPRAZOLE SODIUM 40 MG PO TBEC
40.0000 mg | DELAYED_RELEASE_TABLET | Freq: Two times a day (BID) | ORAL | Status: DC
  Administered 2020-03-25 – 2020-03-27 (×5): 40 mg via ORAL
  Filled 2020-03-24 (×5): qty 1

## 2020-03-24 NOTE — ED Provider Notes (Signed)
[facility] Provider Note   CSN: [id number] Arrival date & time: 03/24/20  1702     History Chief Complaint  Patient presents with  . Palpitations    [facility] is a 64 y.o. male.  Past medical history notable for diastolic heart failure, paroxysmal A. fib, DVT/PE on anticoagulation.  Presents to ER with concern for chest pain, shortness of breath, elevated heart rate.  He reports over the past week or so he has had increase shortness of breath as well as increased leg swelling.  Weight is up to 375 pounds (dry weight 360 pounds).  Today having chest pain, chest tightness, central, nonradiating, mild to moderate.  Reports he checks his heart rate daily and normally are 70s to 80s.  Today heart rate was very fast.  Completed chart review, reviewed notes from heart failure clinic.  Diastolic heart failure, 3 mg twice daily Bumex.  HPI     Past Medical History:  Diagnosis Date  . Acute on chronic congestive heart failure ([place])   . Acute on chronic diastolic CHF (congestive heart failure) ([place]) 08/23/2019  . Acute on chronic right-sided heart failure ([place]) 08/06/2019  . Acute respiratory failure with hypoxia ([place])   . Anemia of chronic disease   . Atrial fibrillation ([place]) 12/23/2019  . Atrial tachycardia ([place]) 08/12/2019  . Benign essential HTN   . Chest pain 08/23/2019  . Chronic anticoagulation 12/26/2019  . Chronic heart failure with preserved EF 05/30/2019   Echo 07/2019: EF 55-60, GR 1 DD  . Chronic pain syndrome   . Cirrhosis of liver without ascites ([place])   . Class 2 severe obesity due to excess calories with serious comorbidity and body mass index (BMI) of 39.0 to 39.9 in adult ([place]) 05/30/2019  . Debility 03/13/2019  . Drug induced constipation   . Dyspnea   . Fatty liver disease, nonalcoholic   . GIB (gastrointestinal bleeding) 03/04/2019  . Heart failure, systolic, acute ([place]) 12/31/1005  . History of pulmonary embolism 08/12/2019   . Hyperlipidemia 01/24/2019  . Hypertension 01/24/2019  . Hypoalbuminemia due to protein-calorie malnutrition ([place])   . Hypokalemia   . Hyponatremia   . Hypotension due to drugs   . Insomnia 01/24/2019  . Lactic acidosis 08/23/2019  . Lumbar disc herniation 01/24/2019  . Mixed hyperlipidemia 01/24/2019  . Morbid obesity with BMI of 40.0-44.9, adult ([place]) 05/30/2019  . Normocytic anemia 06/08/2019  . Occasional tremors 08/12/2019  . OSA (obstructive sleep apnea) 08/12/2019  . Palpitations 12/26/2019  . Peripheral edema   . Personal history of DVT (deep vein thrombosis) 12/26/2019  . Portal hypertensive gastropathy ([place]) 03/06/2019   Seen on EGD 03/06/2019  . Pulmonary emboli ([place]) 06/08/2019  . Pulmonary embolism ([place]) 06/08/2019  . Redness and swelling of lower leg 08/23/2019  . Shock ([place])   . Thrombocytopenia [facility])     Patient Active Problem List   Diagnosis Date Noted  . Atrial fibrillation with rapid ventricular response ([place]) 03/24/2020  . Acute on chronic right heart failure ([place]) 01/27/2020  . Acute respiratory failure with hypoxia ([place])   . Fatty liver disease, nonalcoholic   . Shock ([place])   . Personal history of DVT (deep vein thrombosis) 12/26/2019  . Chronic anticoagulation 12/26/2019  . Palpitations 12/26/2019  . Atrial fibrillation ([place]) 12/23/2019  . Heart failure, systolic, acute ([place]) 04/01/7587  . Acute on chronic congestive heart failure ([place])   . Acute on chronic diastolic CHF (congestive heart failure) ([place]) 08/23/2019  .  Redness and swelling of lower leg 08/23/2019  . Chest pain 08/23/2019  . Lactic acidosis 08/23/2019  . History of pulmonary embolism 08/12/2019  . Atrial tachycardia ([place]) 08/12/2019  . Occasional tremors 08/12/2019  . OSA (obstructive sleep apnea) 08/12/2019  . Acute on chronic right-sided heart failure ([place]) 08/06/2019  . Pulmonary embolism ([place]) 06/08/2019  . Normocytic anemia 06/08/2019  . Pulmonary emboli ([place]) 06/08/2019  . Chronic  heart failure with preserved ejection fraction ([place]) 05/30/2019  . Class 2 severe obesity due to excess calories with serious comorbidity and body mass index (BMI) of 39.0 to 39.9 in adult ([place]) 05/30/2019  . Morbid obesity with BMI of 40.0-44.9, adult ([place]) 05/30/2019  . Hyponatremia   . Benign essential HTN   . Drug induced constipation   . Peripheral edema   . Hypotension due to drugs   . Hypoalbuminemia due to protein-calorie malnutrition ([place])   . Hypokalemia   . Thrombocytopenia ([place])   . Anemia of chronic disease   . Cirrhosis of liver without ascites ([place])   . Chronic pain syndrome   . Debility 03/13/2019  . Portal hypertensive gastropathy ([place]) 03/06/2019  . Dyspnea   . GIB (gastrointestinal bleeding) 03/04/2019  . Mixed hyperlipidemia 01/24/2019  . Insomnia 01/24/2019  . Hyperlipidemia 01/24/2019  . Hypertension 01/24/2019  . Lumbar disc herniation 01/24/2019    Past Surgical History:  Procedure Laterality Date  . ESOPHAGOGASTRODUODENOSCOPY (EGD) WITH PROPOFOL N/A 03/05/2019   Procedure: ESOPHAGOGASTRODUODENOSCOPY (EGD) WITH PROPOFOL;  Surgeon: Doran Stabler, MD;  Location: [facility];  Service: Gastroenterology;  Laterality: N/A;  Large Blood Clot Removed  . ESOPHAGOGASTRODUODENOSCOPY (EGD) WITH PROPOFOL N/A 03/06/2019   Procedure: ESOPHAGOGASTRODUODENOSCOPY (EGD) WITH PROPOFOL;  Surgeon: Thornton Park, MD;  Location: [facility];  Service: Gastroenterology;  Laterality: N/A;  . HEMORROIDECTOMY    . RIGHT HEART CATH N/A 08/09/2019   Procedure: RIGHT HEART CATH;  Surgeon: Larey Dresser, MD;  [facility];  Service: Cardiovascular;  Laterality: N/A;       Family History  Problem Relation Age of Onset  . Hyperlipidemia Mother   . Hypertension Mother   . Stroke Mother   . CAD Maternal Grandmother   . CAD Maternal Grandfather     Social History   Tobacco Use  . Smoking status: Never Smoker  . Smokeless tobacco: Never Used  Vaping  Use  . Vaping Use: Never used  Substance Use Topics  . Alcohol use: Never  . Drug use: Not Currently    Types: Marijuana    Comment: Smoked for 30 years    Home Medications Prior to Admission medications   Medication Sig Start Date End Date Taking? Authorizing Provider  acetaminophen (TYLENOL) 325 MG tablet Take 2 tablets (650 mg total) by mouth every 4 (four) hours as needed for fever or mild pain. 03/23/19  Yes Angiulli, Lavon Paganini, PA-C  apixaban (ELIQUIS) 5 MG TABS tablet Take 1 tablet (5 mg total) by mouth 2 (two) times daily. 08/12/19  Yes Strader, [place] M, PA-C  bumetanide (BUMEX) 1 MG tablet Take 3 tablets (3 mg total) by mouth 2 (two) times daily. 03/04/20  Yes Bensimhon, Shaune Pascal, MD  Buprenorphine HCl-Naloxone HCl 8-2 MG FILM Place 1 tablet under the tongue in the morning and at bedtime.    Yes [provider]  eplerenone (INSPRA) 50 MG tablet Take 2 tablets (100 mg total) by mouth daily. 02/27/20  Yes Clegg, [name] D, NP  gabapentin (NEURONTIN) 800 MG tablet  Take 1 tablet (800 mg total) by mouth 3 (three) times daily. 03/23/19  Yes Angiulli, Lavon Paganini, PA-C  metoprolol tartrate (LOPRESSOR) 50 MG tablet Take 1 tablet (50 mg total) by mouth 2 (two) times daily. 02/09/20  Yes Mercy Riding, MD  pantoprazole (PROTONIX) 40 MG tablet Take 40 mg by mouth 2 (two) times daily.   Yes [provider]  polyethylene glycol (MIRALAX / GLYCOLAX) 17 g packet Take 17 g by mouth daily. 03/23/19  Yes Angiulli, Lavon Paganini, PA-C  potassium chloride SA (KLOR-CON) 20 MEQ tablet Take 40 mEq by mouth in the morning and at bedtime.   Yes [provider]  pravastatin (PRAVACHOL) 20 MG tablet Take 1 tablet (20 mg total) by mouth daily. 03/23/19  Yes Angiulli, Lavon Paganini, PA-C  VENTOLIN HFA 108 (90 Base) MCG/ACT inhaler Inhale 1 puff into the lungs every 4 (four) hours as needed for shortness of breath. 03/23/19  Yes Angiulli, Lavon Paganini, PA-C  zolpidem (AMBIEN) 10 MG tablet Take 10 mg by  mouth at bedtime as needed for sleep.  05/19/19  Yes [provider]  losartan (COZAAR) 25 MG tablet Take 0.5 tablets (12.5 mg total) by mouth at bedtime. Patient not taking: No sig reported 02/27/20   [name] D, NP  metolazone (ZAROXOLYN) 2.5 MG tablet Take 1 tablet weekly on Tuesdays. Patient not taking: Reported on 03/24/2020 03/12/20   Larey Dresser, MD    Allergies    Penicillins  Review of Systems   Review of Systems  Constitutional: Negative for chills and fever.  HENT: Negative for ear pain and sore throat.   Eyes: Negative for pain and visual disturbance.  Respiratory: Positive for shortness of breath. Negative for cough.   Cardiovascular: Positive for palpitations. Negative for chest pain.  Gastrointestinal: Negative for abdominal pain and vomiting.  Genitourinary: Negative for dysuria and hematuria.  Musculoskeletal: Negative for arthralgias and back pain.  Skin: Negative for color change and rash.  Neurological: Negative for seizures and syncope.  All other systems reviewed and are negative.   Physical Exam Updated Vital Signs BP 132/90   Pulse (!) 115   Temp 98.1 F (36.7 C) (Oral)   Resp 18   Ht 6' 10"  (2.083 m)   Wt (!) 167.8 kg   SpO2 96%   BMI 38.69 kg/m   Physical Exam Vitals and nursing note reviewed.  Constitutional:      Appearance: He is well-developed and well-nourished.  HENT:     Head: Normocephalic and atraumatic.  Eyes:     Conjunctiva/sclera: Conjunctivae normal.  Cardiovascular:     Rate and Rhythm: Tachycardia present. Rhythm irregular.  Pulmonary:     Effort: Pulmonary effort is normal. No respiratory distress.     Breath sounds: Normal breath sounds.  Abdominal:     Palpations: Abdomen is soft.     Tenderness: There is no abdominal tenderness.  Musculoskeletal:        General: No deformity or signs of injury.     Cervical back: Neck supple.     Right lower leg: Edema present.     Left lower leg: Edema present.      Comments: Bilateral lower leg edema  Skin:    General: Skin is warm and dry.     Capillary Refill: Capillary refill takes less than 2 seconds.  Neurological:     General: No focal deficit present.     Mental Status: He is alert and oriented to person, place, and  time.  Psychiatric:        Mood and Affect: Mood and affect and mood normal.        Behavior: Behavior normal.      ED Results / Procedures / Treatments   Labs (all labs ordered are listed, but only abnormal results are displayed) Labs Reviewed  CBC WITH DIFFERENTIAL/PLATELET - Abnormal; Notable for the following components:      Result Value   Hemoglobin 12.4 (*)    MCH 24.7 (*)    MCHC 29.1 (*)    RDW 15.7 (*)    All other components within normal limits  BASIC METABOLIC PANEL - Abnormal; Notable for the following components:   Glucose, Bld 107 (*)    All other components within normal limits  BRAIN NATRIURETIC PEPTIDE - Abnormal; Notable for the following components:   B Natriuretic Peptide 299.9 (*)    All other components within normal limits  RESP PANEL BY RT-PCR (FLU A&B, COVID) ARPGX2  MAGNESIUM  TROPONIN I (HIGH SENSITIVITY)  TROPONIN I (HIGH SENSITIVITY)    EKG EKG Interpretation  Date/Time:  Sunday March 24 2020 17:00:30 EST Ventricular Rate:  147 PR Interval:    QRS Duration: 97 QT Interval:  300 QTC Calculation: 470 R Axis:   -66 Text Interpretation: Atrial fibrillation Left anterior fascicular block Anteroseptal infarct, old when compared to prior, afib has replaced sinus Confirmed by ,  ([id number]) on 03/24/2020 5:27:45 PM   Radiology No results found.  Procedures Procedures (including critical care time)  Medications Ordered in ED Medications  diltiazem (CARDIZEM) 125 mg in dextrose 5% 125 mL (1 mg/mL) infusion (10 mg/hr Intravenous Rate/Dose Change 03/24/20 1842)  flecainide (TAMBOCOR) tablet 300 mg (has no administration in time range)  metolazone (ZAROXOLYN) tablet 5  mg (has no administration in time range)  furosemide (LASIX) injection 80 mg (80 mg Intravenous Given 03/24/20 1813)  diltiazem (CARDIZEM) 1 mg/mL load via infusion 20 mg (20 mg Intravenous Bolus from Bag 03/24/20 1818)    ED Course  I have reviewed the triage vital signs and the nursing notes.  Pertinent labs & imaging results that were available during my care of the patient were reviewed by me and considered in my medical decision making (see chart for details).  Clinical Course as of 03/24/20 2054  Sun Mar 24, 2020  1708 Reviewed chart - diastolic HF, DVT/PT on apixaban [RD]    Clinical Course User Index [RD] ,  S, MD   MDM Rules/Calculators/A&P                          64  year old male presents to ER with concern for leg swelling, shortness of breath, elevated heart rate.  Patient in A. fib with RVR.  Noted to have significant leg swelling, patient has had increased weight gain recently.  Concern for heart failure exacerbation.  Offered medical management versus cardioversion for management of his A. fib with RVR, patient would like to pursue medical management.  Will start diltiazem.  For fluid overload state, will start IV diuretic therapy.  Follow closely at heart failure clinic, reviewed management recommendations with on-call cardiology, Dr. Kalman Shan.  He agrees with current management and will come evaluate patient and admit to the cardiology service.  Final Clinical Impression(s) / ED Diagnoses Final diagnoses:  Atrial fibrillation with rapid ventricular response (HCC)  Acute on chronic diastolic congestive heart failure ([place])    Rx / DC Orders ED Discharge Orders  None       Lucrezia Starch, MD 03/24/20 2054

## 2020-03-24 NOTE — H&P (Signed)
Cardiology History & Physical    Patient ID: Charles Gray MRN: [id number], DOB/AGE: [birth year]   Admit date: 03/24/2020  Primary Physician: Enid Skeens., MD Primary Cardiologist: Charles Salines, DO  Patient Profile    Charles Gray is a 64 year old male with obesity, mild COPD, [name], DVT/PE (05/2019, on Eliquis), paroxysmal AF, hypertension, untreated OSA, insomnia, coronary artery disease by CCTA, chronic pain, upper GI bleed (Fall 2020), and HFpEF. Cardiology has been consulted for evaluation of atrial fibrillation with RVR.  History of Present Illness    Charles Gray reports that he felt like he had been doing well until about 1 week ago, legs started swelling and he started feeling a little more short of breath. Weight varies quite a bit, but had been in the mid-upper 360s previously, for the last week or so has been more consistently in the 370s, up to 375 lb. Legs have been more swollen as well. He has weekly paramedicine home visits and was feeling badly at his last visit 12/7 but it was not felt that he needed hospitalization. He had a visit with Dr. Radford Pax scheduled but canceled it. Today he started feeling like his heart was racing, more short of breath, fatigued, and dizzy with exertion so he came to the ED for further evaluation. Thought he was back in atrial fibrillation. He has been compliant with dietary restriction and medications. Doesn't use CPAP because he has insomnia and only doses off intermittently during the day, never consistently sleeps. Has chronic orthopnea and sleeps in a recliner.   In the ED he was found to be in atrial fibrillation with rates in the 140s, normotensive. He was started on diltiazem infusion with bolus. Rate is starting to improve, now 110s. BNP is 300 (from 33 one month ago), first troponin is 7. He was offered cardioversion in the ED but declined.   Last admitted for heart failure 01/2020, required metolazone for diuresis and was discharged  on Bumex 39m BID, aldactone 1060m and metolazone prn. Required significant potassium repletion. Normal pressures and output on RHC following diuresis. Discharge weight 360-362 lb.   Past Medical History   Past Medical History:  Diagnosis Date  . Acute on chronic congestive heart failure ([facility] [place]  . Acute on chronic diastolic CHF (congestive heart failure) (HCCul[place]5/03/2020  . Acute on chronic right-sided heart failure (HCSav[place]4/25/2021  . Acute respiratory failure with hypoxia (HCKemp  . Anemia of chronic disease   . Atrial fibrillation (HC[facility] [place]9/02/2020  . Atrial tachycardia (HCL[place]ers[place]5/04/2019  . Benign essential HTN   . Chest pain 08/23/2019  . Chronic anticoagulation 12/26/2019  . Chronic heart failure with preserved EF 05/30/2019   Echo 07/2019: EF 55-60, GR 1 DD  . Chronic pain syndrome   . Cirrhosis of liver without ascites (HCRock [place]  . Class 2 severe obesity due to excess calories with serious comorbidity and body mass index (BMI) of 39.0 to 39.9 in adult (HCOssian2/16/2021  . Debility 03/13/2019  . Drug induced constipation   . Dyspnea   . Fatty liver disease, nonalcoholic   . GIB (gastrointestinal bleeding) 03/04/2019  . Heart failure, systolic, acute (HCWeid[place]5/5/88/5027. History of pulmonary embolism 08/12/2019  . Hyperlipidemia 01/24/2019  . Hypertension 01/24/2019  . Hypoalbuminemia due to protein-calorie malnutrition (HCSierra [facility]  . Hypokalemia   . Hyponatremia   . Hypotension due to drugs   . Insomnia 01/24/2019  . Lactic acidosis 08/23/2019  . Lumbar disc herniation 01/24/2019  .  Mixed hyperlipidemia 01/24/2019  . Morbid obesity with BMI of 40.0-44.9, adult ([place]) 05/30/2019  . Normocytic anemia 06/08/2019  . Occasional tremors 08/12/2019  . OSA (obstructive sleep apnea) 08/12/2019  . Palpitations 12/26/2019  . Peripheral edema   . Personal history of DVT (deep vein thrombosis) 12/26/2019  . Portal hypertensive gastropathy ([place]) 03/06/2019   Seen on EGD 03/06/2019  . Pulmonary emboli ([place])  06/08/2019  . Pulmonary embolism ([place]) 06/08/2019  . Redness and swelling of lower leg 08/23/2019  . Shock ([place])   . Thrombocytopenia ([place])     Past Surgical History:  Procedure Laterality Date  . ESOPHAGOGASTRODUODENOSCOPY (EGD) WITH PROPOFOL N/A 03/05/2019   Procedure: ESOPHAGOGASTRODUODENOSCOPY (EGD) WITH PROPOFOL;  Surgeon: Doran Stabler, MD;  Location: [place];  Service: Gastroenterology;  Laterality: N/A;  Large Blood Clot Removed  . ESOPHAGOGASTRODUODENOSCOPY (EGD) WITH PROPOFOL N/A 03/06/2019   Procedure: ESOPHAGOGASTRODUODENOSCOPY (EGD) WITH PROPOFOL;  Surgeon: Thornton Park, MD;  Location: [place];  Service: Gastroenterology;  Laterality: N/A;  . HEMORROIDECTOMY    . RIGHT HEART CATH N/A 08/09/2019   Procedure: RIGHT HEART CATH;  Surgeon: Larey Dresser, MD;  Location: [place] [facility];  Service: Cardiovascular;  Laterality: N/A;     Allergies Allergies  Allergen Reactions  . Penicillins     Did it involve swelling of the face/tongue/throat, SOB, or low BP? N/A Did it involve sudden or severe rash/hives, skin peeling, or any reaction on the inside of your mouth or nose? N/A Did you need to seek medical attention at a hospital or doctor's office?N/A When did it last happen?N/A If all above answers are "NO", may proceed with cephalosporin use.    Home Medications    Prior to Admission medications   Medication Sig Start Date End Date Taking? Authorizing Provider  acetaminophen (TYLENOL) 325 MG tablet Take 2 tablets (650 mg total) by mouth every 4 (four) hours as needed for fever or mild pain. 03/23/19  Yes Angiulli, Lavon Paganini, PA-C  apixaban (ELIQUIS) 5 MG TABS tablet Take 1 tablet (5 mg total) by mouth 2 (two) times daily. 08/12/19  Yes Strader, [place] M, PA-C  bumetanide (BUMEX) 1 MG tablet Take 3 tablets (3 mg total) by mouth 2 (two) times daily. 03/04/20  Yes Bensimhon, Shaune Pascal, MD  Buprenorphine HCl-Naloxone HCl 8-2 MG FILM Place 1 tablet under the  tongue in the morning and at bedtime.    Yes [provider]  eplerenone (INSPRA) 50 MG tablet Take 2 tablets (100 mg total) by mouth daily. 02/27/20  Yes Clegg, [name] D, NP  gabapentin (NEURONTIN) 800 MG tablet Take 1 tablet (800 mg total) by mouth 3 (three) times daily. 03/23/19  Yes Angiulli, Lavon Paganini, PA-C  metoprolol tartrate (LOPRESSOR) 50 MG tablet Take 1 tablet (50 mg total) by mouth 2 (two) times daily. 02/09/20  Yes Mercy Riding, MD  pantoprazole (PROTONIX) 40 MG tablet Take 40 mg by mouth 2 (two) times daily.   Yes [provider]  polyethylene glycol (MIRALAX / GLYCOLAX) 17 g packet Take 17 g by mouth daily. 03/23/19  Yes Angiulli, Lavon Paganini, PA-C  potassium chloride SA (KLOR-CON) 20 MEQ tablet Take 40 mEq by mouth in the morning and at bedtime.   Yes [provider]  pravastatin (PRAVACHOL) 20 MG tablet Take 1 tablet (20 mg total) by mouth daily. 03/23/19  Yes Angiulli, Lavon Paganini, PA-C  VENTOLIN HFA 108 (90 Base) MCG/ACT inhaler Inhale 1 puff into the lungs every 4 (four) hours  as needed for shortness of breath. 03/23/19  Yes Angiulli, Lavon Paganini, PA-C  zolpidem (AMBIEN) 10 MG tablet Take 10 mg by mouth at bedtime as needed for sleep.  05/19/19  Yes [provider]  losartan (COZAAR) 25 MG tablet Take 0.5 tablets (12.5 mg total) by mouth at bedtime. Patient not taking: No sig reported 02/27/20   [name] D, NP  metolazone (ZAROXOLYN) 2.5 MG tablet Take 1 tablet weekly on Tuesdays. Patient not taking: Reported on 03/24/2020 03/12/20   Larey Dresser, MD    Family History    Family History  Problem Relation Age of Onset  . Hyperlipidemia Mother   . Hypertension Mother   . Stroke Mother   . CAD Maternal Grandmother   . CAD Maternal Grandfather    He indicated that the status of his mother is unknown. He indicated that the status of his maternal grandmother is unknown. He indicated that the status of his maternal grandfather is unknown.   Social  History    Social History   Socioeconomic History  . Marital status: Single    Spouse name: Not on file  . Number of children: Not on file  . Years of education: Not on file  . Highest education level: Not on file  Occupational History  . Not on file  Tobacco Use  . Smoking status: Never Smoker  . Smokeless tobacco: Never Used  Vaping Use  . Vaping Use: Never used  Substance and Sexual Activity  . Alcohol use: Never  . Drug use: Not Currently    Types: Marijuana    Comment: Smoked for 30 years  . Sexual activity: Not on file  Other Topics Concern  . Not on file  Social History Narrative  . Not on file   Social Determinants of Health   Financial Resource Strain: Low Risk   . Difficulty of Paying Living Expenses: Not very hard  Food Insecurity: No Food Insecurity  . Worried About Charity fundraiser in the Last Year: Never true  . Ran Out of Food in the Last Year: Never true  Transportation Needs: Unmet Transportation Needs  . Lack of Transportation (Medical): Yes  . Lack of Transportation (Non-Medical): Yes  Physical Activity: Not on file  Stress: Not on file  Social Connections: Not on file  Intimate Partner Violence: Not on file     Review of Systems    A comprehensive review of systems was performed with pertinent positives and negatives noted in the HPI.  Physical Exam    BP 105/77   Pulse (!) 121   Temp 98.8 F (37.1 C) (Oral)   Resp 20   Ht 6' 7"  (2.007 m)   Wt (!) 167.8 kg   SpO2 98%   BMI 41.68 kg/m  General: Alert, NAD HEENT: Normal  Neck: no masses, JVP is difficult to visualize but probably 12-15 cm Lungs:  Resp regular and unlabored, CTA bilaterally. Heart: Irregular rhythm, no s3, s4, or murmurs. Abdomen: Soft, non-tender, non-distended, BS +.  Extremities: Warm. 3+ bilateral lower extremity edema with overlying dermatitis. Radial pulses 2+ and equal bilaterally. Psych: Normal affect. Neuro: Alert and oriented. No gross focal deficits.  No abnormal movements.  Labs    Cardiac Panel (last 3 results) Recent Labs    03/24/20 1727 03/24/20 2014  TROPONINIHS 7 7    Lab Results  Component Value Date   WBC 8.5 03/24/2020   HGB 12.4 (L) 03/24/2020   HCT 42.6 03/24/2020  MCV 84.7 03/24/2020   PLT 167 03/24/2020    Recent Labs  Lab 03/24/20 1723  NA 137  K 4.3  CL 100  CO2 24  BUN 15  CREATININE 0.98  CALCIUM 8.9  GLUCOSE 107*   Lab Results  Component Value Date   CHOL 130 01/28/2020   HDL 37 (L) 01/28/2020   LDLCALC 73 01/28/2020   TRIG 100 01/28/2020   Lab Results  Component Value Date   DDIMER <0.27 08/06/2019     Radiology Studies    No results found.  ECG & Cardiac Imaging    CCTA with FFR 10/19/2019: 1. Left Main: 0.93 2. LAD: 0.87, 0.91, 0.95 3. LCX: 0.93, 0.87, 0.70 4. RCA: 0.99, 0.97, 0.67 IMPRESSION: The FFRct results above shows flow limiting lesions in the distal LCX and distal RCA.   [place] 08/09/2019: 1. Normal filling pressures.  2. Normal PA pressure.  3. Normal cardiac output.   TTE 08/07/2019: 1. Suboptimal image quality for diagnostic purposes.  2. Left ventricular ejection fraction, by estimation, is 55 to 60%. The  left ventricle has normal function. Left ventricular endocardial border  not optimally defined to evaluate regional wall motion. Left ventricular  diastolic parameters are consistent  with Grade I diastolic dysfunction (impaired relaxation).  3. Right ventricular systolic function was not well visualized. The right  ventricular size is not well visualized.  4. The mitral valve is grossly normal. No evidence of mitral valve  regurgitation. No evidence of mitral stenosis.  5. The aortic valve was not well visualized. Aortic valve regurgitation  is not visualized. No aortic stenosis is present.   ECG: atrial fibrillation with RVR, rate 147, LAFB (old)  - personally reviewed.  Assessment & Plan    Acute on chronic diastolic heart failure: Hypervolemic  but well perfused. Weight is up about 10 lb, suspect untreated OSA may be precipitating his recurrent heart failure hospitalizations. On high-dose diuretic chronically. Goal weight is around 360 lb.  - Lasix 5m given in ED, responded to this dose last admission with metolazone. - Will dose Metolazone 523mnow and start with Lasix 12090mID in the morning - Spironolactone substituted for Eplerenone 100m30m Strict I&Os, standing weights - Will encourage CPAP...need to work on strategies for better sleep/compliance  Atrial fibrillation with RVR: suspect this was precipitated by decompensated heart failure, with AF accounting for more acute symptoms. Rate control improved now on diltiazem. Will start with diuresis and consider cardioversion if AF persists once he is more euvolemic. Doesn't tolerate AF well, so will try a single dose of flecainide for now. He has been on uninterrupted anticoagulation for over 1 month.   - Flecainide 300mg58m 1 dose, not a good long-term option given probable coronary disease - Stop diltiazem infusion - Transition back to oral metoprolol - Continue Eliquis  OSA: reports not using CPAP due to insomnia, sporadic sleep pattern. Likely contributing significantly to his heart failure and AF. - Consider inpatient consult to sleep medicine - Ordered CPAP QHS  History of DVT/PE: No PH or residual perfusion defects. Continue eliquis.   Hypertension: normotensive. meds as above for HF/AF  Chronic CAD: distal disease on CCTA, no angina, managed medically. Continue statin and metoprolol.  Chronic pain: continue home regimen    Nutrition: Heart healthy diet. NPO at midnight for now pending decision for cardioversion DVT ppx: Eliquis GI ppx: PPI Advanced Care Planning: Full Code   Signed, ScottMarykay Lex12/03/2020, 7:47 PM

## 2020-03-24 NOTE — ED Triage Notes (Signed)
Pt presents w/ c/o palpitation, central CP w/ SOB, and malaise since 0800 this AM. Pt states he believes believed himself to be in Afib RVR which he has been in once prior, and called EMS.   On arrival, Pt Afib RVR on monitor, stable pressure.

## 2020-03-24 NOTE — Progress Notes (Signed)
Pt refusing bed alarm. Asked to not get out of bed without staff being present.

## 2020-03-24 NOTE — ED Notes (Signed)
Pt resting in bed. NADN

## 2020-03-25 ENCOUNTER — Inpatient Hospital Stay (HOSPITAL_COMMUNITY): Payer: Medicare Other

## 2020-03-25 DIAGNOSIS — I4891 Unspecified atrial fibrillation: Secondary | ICD-10-CM

## 2020-03-25 DIAGNOSIS — I5033 Acute on chronic diastolic (congestive) heart failure: Secondary | ICD-10-CM

## 2020-03-25 DIAGNOSIS — I5043 Acute on chronic combined systolic (congestive) and diastolic (congestive) heart failure: Secondary | ICD-10-CM | POA: Diagnosis not present

## 2020-03-25 DIAGNOSIS — K766 Portal hypertension: Secondary | ICD-10-CM | POA: Diagnosis not present

## 2020-03-25 DIAGNOSIS — Z6841 Body Mass Index (BMI) 40.0 and over, adult: Secondary | ICD-10-CM | POA: Diagnosis not present

## 2020-03-25 DIAGNOSIS — R002 Palpitations: Secondary | ICD-10-CM | POA: Diagnosis not present

## 2020-03-25 DIAGNOSIS — I110 Hypertensive heart disease with heart failure: Secondary | ICD-10-CM | POA: Diagnosis not present

## 2020-03-25 LAB — BASIC METABOLIC PANEL
Anion gap: 12 (ref 5–15)
Anion gap: 15 (ref 5–15)
BUN: 18 mg/dL (ref 8–23)
BUN: 21 mg/dL (ref 8–23)
CO2: 31 mmol/L (ref 22–32)
CO2: 31 mmol/L (ref 22–32)
Calcium: 9.1 mg/dL (ref 8.9–10.3)
Calcium: 9.4 mg/dL (ref 8.9–10.3)
Chloride: 94 mmol/L — ABNORMAL LOW (ref 98–111)
Chloride: 87 mmol/L — ABNORMAL LOW (ref 98–111)
Creatinine, Ser: 1.14 mg/dL (ref 0.61–1.24)
Creatinine, Ser: 1.3 mg/dL — ABNORMAL HIGH (ref 0.61–1.24)
GFR, Estimated: >60 mL/min (ref >60)
GFR, Estimated: >60 mL/min (ref >60)
Glucose, Bld: 111 mg/dL — ABNORMAL HIGH (ref 70–99)
Glucose, Bld: 108 mg/dL — ABNORMAL HIGH (ref 70–99)
Potassium: 3.4 mmol/L — ABNORMAL LOW (ref 3.5–5.1)
Potassium: 3 mmol/L — ABNORMAL LOW (ref 3.5–5.1)
Sodium: 137 mmol/L (ref 135–145)
Sodium: 133 mmol/L — ABNORMAL LOW (ref 135–145)

## 2020-03-25 LAB — ECHOCARDIOGRAM COMPLETE
Area-P 1/2: 2 cm2
Height: 82 in
S' Lateral: 4.5 cm
Weight: 5920 oz

## 2020-03-25 LAB — MAGNESIUM: Magnesium: 2.1 mg/dL (ref 1.7–2.4)

## 2020-03-25 MED ORDER — PERFLUTREN LIPID MICROSPHERE
1.0000 mL | INTRAVENOUS | Status: AC | PRN
  Administered 2020-03-25: 15:00:00 2 mL via INTRAVENOUS
  Filled 2020-03-25 (×2): qty 10

## 2020-03-25 MED ORDER — BUPRENORPHINE HCL 8 MG SL SUBL
8.0000 mg | SUBLINGUAL_TABLET | Freq: Two times a day (BID) | SUBLINGUAL | Status: DC
  Administered 2020-03-25 – 2020-03-27 (×5): 8 mg via SUBLINGUAL
  Filled 2020-03-25 (×4): qty 1

## 2020-03-25 MED ORDER — POTASSIUM CHLORIDE CRYS ER 20 MEQ PO TBCR
40.0000 meq | EXTENDED_RELEASE_TABLET | Freq: Once | ORAL | Status: AC
  Administered 2020-03-25: 18:00:00 40 meq via ORAL
  Filled 2020-03-25: qty 2

## 2020-03-25 MED ORDER — SPIRONOLACTONE 25 MG PO TABS
100.0000 mg | ORAL_TABLET | Freq: Every day | ORAL | Status: DC
  Administered 2020-03-25 – 2020-03-27 (×3): 100 mg via ORAL
  Filled 2020-03-25 (×3): qty 4

## 2020-03-25 NOTE — Progress Notes (Signed)
  Echocardiogram 2D Echocardiogram has been performed.  Charles Gray 03/25/2020, 3:42 PM

## 2020-03-25 NOTE — Evaluation (Signed)
Physical Therapy Evaluation Patient Details Name: Charles Gray MRN: [id number] DOB: [birth year] Today's Date: 03/25/2020   History of Present Illness  Charles Gray is a 64 year old male with obesity, mild COPD, [name], DVT/PE (05/2019, on Eliquis), paroxysmal AF, hypertension, untreated OSA, insomnia, coronary artery disease by CCTA, chronic pain, upper GI bleed (Fall 2020), and HFpEF. Presents to ER with chest pain, SOB, and increased leg swelling. Cardiology has been consulted for evaluation of atrial fibrillation with RVR.  Clinical Impression  Prior to admission, pt lives alone in a second floor apartment, uses a walker, and is independent with BADL's. Pt reports increased difficulty with IADL's; utilizes "[employer]," and [employer] delivery for groceries. Pt presents with decreased cardiopulmonary endurance, pain (chronic pain in shoulders, back), and weakness. Ambulating 100 feet with a walker at a supervision level, requiring multiple standing rest breaks. HR 76-94 bpm, SpO2 96% on RA. Would benefit from follow up HHPT to address deficits and maximize functional independence.    Follow Up Recommendations Home health PT    Equipment Recommendations  None recommended by PT    Recommendations for Other Services   OT consult    Precautions / Restrictions Precautions Precautions: Fall Restrictions Weight Bearing Restrictions: No      Mobility  Bed Mobility               General bed mobility comments: Sitting EOB upon arrival    Transfers Overall transfer level: Needs assistance Equipment used: Rolling walker (2 wheeled) Transfers: Sit to/from Stand Sit to Stand: Supervision            Ambulation/Gait Ambulation/Gait assistance: Supervision Gait Distance (Feet): 100 Feet Assistive device: Rolling walker (2 wheeled) Gait Pattern/deviations: Step-through pattern;Decreased stride length;Trunk flexed Gait velocity: decreased   General Gait Details: Pt pausing to  take 2-3 short standing rest breaks, supervision for safety, no gross imbalance. Moderate reliance through arms on walker, pt reporting bilateral shoulder fatigue/pain  Stairs            Wheelchair Mobility    Modified Rankin (Stroke Patients Only)       Balance Overall balance assessment: Mild deficits observed, not formally tested                                           Pertinent Vitals/Pain Pain Assessment: Faces Faces Pain Scale: Hurts a little bit Pain Location: stomach, back Pain Descriptors / Indicators: Discomfort;Grimacing;Guarding Pain Intervention(s): Limited activity within patient's tolerance;Monitored during session    Home Living Family/patient expects to be discharged to:: Private residence Living Arrangements: Alone Available Help at Discharge: Family;Available PRN/intermittently Type of Home: Apartment Home Access: Stairs to enter Entrance Stairs-Rails: Right;Left;Can reach both Entrance Stairs-Number of Steps:  (flight) Home Layout: One level Home Equipment: Tub bench;Walker - 2 wheels;Hand held shower head      Prior Function Level of Independence: Needs assistance   Gait / Transfers Assistance Needed: Using walker, denies falls  ADL's / Homemaking Assistance Needed: Orders groceries from [employer] vs "[employer]", has difficulty with cleaning/cooking  Comments: Has HHRN 1x/wk     Hand Dominance   Dominant Hand: Right    Extremity/Trunk Assessment   Upper Extremity Assessment Upper Extremity Assessment: Defer to OT evaluation    Lower Extremity Assessment Lower Extremity Assessment: Overall WFL for tasks assessed       Communication   Communication: No difficulties  Cognition Arousal/Alertness: Awake/alert Behavior During Therapy: WFL for tasks assessed/performed Overall Cognitive Status: Within Functional Limits for tasks assessed                                        General Comments       Exercises     Assessment/Plan    PT Assessment Patient needs continued PT services  PT Problem List Decreased strength;Decreased activity tolerance;Decreased balance;Decreased mobility;Pain       PT Treatment Interventions DME instruction;Gait training;Stair training;Therapeutic activities;Functional mobility training;Therapeutic exercise;Balance training;Patient/family education    PT Goals (Current goals can be found in the Care Plan section)  Acute Rehab PT Goals Patient Stated Goal: be able to eat PT Goal Formulation: With patient Time For Goal Achievement: 04/08/20 Potential to Achieve Goals: Good    Frequency Min 3X/week   Barriers to discharge        Co-evaluation               AM-PAC PT "6 Clicks" Mobility  Outcome Measure Help needed turning from your back to your side while in a flat bed without using bedrails?: None Help needed moving from lying on your back to sitting on the side of a flat bed without using bedrails?: None Help needed moving to and from a bed to a chair (including a wheelchair)?: None Help needed standing up from a chair using your arms (e.g., wheelchair or bedside chair)?: None Help needed to walk in hospital room?: None Help needed climbing 3-5 steps with a railing? : A Little 6 Click Score: 23    End of Session   Activity Tolerance: Patient tolerated treatment well Patient left: in chair;with call bell/phone within reach Nurse Communication: Mobility status PT Visit Diagnosis: Difficulty in walking, not elsewhere classified (R26.2)    Time: 7116-5790 PT Time Calculation (min) (ACUTE ONLY): 19 min   Charges:   PT Evaluation $PT Eval Moderate Complexity: 1 Mod          Wyona Almas, PT, DPT Acute Rehabilitation Services Pager [phone] Office [phone]   Deno Etienne 03/25/2020, 9:31 AM

## 2020-03-25 NOTE — Progress Notes (Signed)
Progress Note  Patient Name: Charles Gray Date of Encounter: 03/25/2020  [facility] Cardiologist: Berniece Salines, DO   Subjective   Net -1.6 L yesterday.  BP 103/68, SPO2 96% on room air.  Weight unchanged.  Creatinine stable (1.0 > 1.1).  Denies any chest pain.  Reports he continues to have shortness of breath  Inpatient Medications    Scheduled Meds: . apixaban  5 mg Oral BID  . buprenorphine  8 mg Sublingual BID AC & HS  . gabapentin  800 mg Oral TID  . metoprolol tartrate  25 mg Oral BID  . pantoprazole  40 mg Oral BID  . potassium chloride SA  40 mEq Oral BID  . pravastatin  20 mg Oral Daily  . sodium chloride flush  3 mL Intravenous Q12H  . spironolactone  100 mg Oral Daily   Continuous Infusions: . sodium chloride    . furosemide 120 mg (03/25/20 0810)   PRN Meds: sodium chloride, acetaminophen, albuterol, ondansetron (ZOFRAN) IV, polyethylene glycol, sodium chloride flush, zolpidem   Vital Signs    Vitals:   03/25/20 0401 03/25/20 0408 03/25/20 0803 03/25/20 0818  BP: 90/70  103/68   Pulse: 69  65 70  Resp: 18  18   Temp: 98.9 F (37.2 C)  98.3 F (36.8 C) 98.3 F (36.8 C)  TempSrc: Oral  Oral Oral  SpO2: 95%  96% 96%  Weight:  (!) 167.8 kg    Height:        Intake/Output Summary (Last 24 hours) at 03/25/2020 0952 Last data filed at 03/25/2020 0908 Gross per 24 hour  Intake 960 ml  Output 1800 ml  Net -840 ml   Last 3 Weights 03/25/2020 03/24/2020 03/24/2020  Weight (lbs) 370 lb 372 lb 370 lb  Weight (kg) 167.831 kg 168.738 kg 167.831 kg      Telemetry    Was in A. fib with rate 100s, converted to normal sinus rhythm with rate 60s overnight- Personally Reviewed  ECG    Normal sinus rhythm, rate 73- Personally Reviewed  Physical Exam   GEN: No acute distress.   Neck: No JVD appreciated but difficult to assess given body habitus Cardiac: RRR, no murmurs, distant heart sounds Respiratory: Clear to auscultation bilaterally. GI:  Soft, nontender, non-distended  MS:  1+ lower extremity edema Neuro:  Nonfocal  Psych: Normal affect   Labs    High Sensitivity Troponin:   Recent Labs  Lab 03/24/20 1727 03/24/20 2014  TROPONINIHS 7 7      Chemistry Recent Labs  Lab 03/19/20 1205 03/24/20 1723 03/25/20 0635  NA 136 137 137  K 4.0 4.3 3.4*  CL 96* 100 94*  CO2 29 24 31   GLUCOSE 116* 107* 111*  BUN 13 15 18   CREATININE 0.90 0.98 1.14  CALCIUM 9.1 8.9 9.1  GFRNONAA >60 >60 >60  ANIONGAP 11 13 12      Hematology Recent Labs  Lab 03/24/20 1723  WBC 8.5  RBC 5.03  HGB 12.4*  HCT 42.6  MCV 84.7  MCH 24.7*  MCHC 29.1*  RDW 15.7*  PLT 167    BNP Recent Labs  Lab 03/24/20 1727  BNP 299.9*     DDimer No results for input(s): DDIMER in the last 168 hours.   Radiology    No results found.  Cardiac Studies     Patient Profile     64 y.o. 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   Assessment & Plan    Acute on chronic diastolic heart failure: Hypervolemic but well perfused. Weight is up about 10 lb, suspect untreated OSA may be precipitating his recurrent heart failure hospitalizations. On high-dose diuretic chronically. Goal weight is around 360 lb.  - Echocardiogram - Continue Lasix 165m BID  - Spironolactone substituted for Eplerenone 1043m - Strict I&Os, standing weights - Will encourage CPAP...need to work on strategies for better sleep/compliance  Atrial fibrillation with RVR: suspect this was precipitated by decompensated heart failure, with AF accounting for more acute symptoms. Rate control improved now on diltiazem. Will start with diuresis and consider cardioversion if AF persists once he is more euvolemic. Doesn't tolerate AF well, so will try a single dose of flecainide overnight, converted to normal sinus  rhythm.  He has been on uninterrupted anticoagulation for over 1 month. - Continue metoprolol 25 mg twice daily - Continue Eliquis  OSA: reports not using CPAP due to insomnia, sporadic sleep pattern. Likely contributing significantly to his heart failure and AF. - Plan outpatient follow-up with Dr. TuRadford Pax Ordered CPAP QHS  History of DVT/PE: No PH or residual perfusion defects. Continue eliquis.   Hypertension: normotensive. meds as above for HF/AF  Chronic CAD: distal disease on CCTA, no angina, managed medically. Continue statin and metoprolol.  Chronic pain: continue home regimen    Nutrition: Heart healthy diet.  DVT ppx: Eliquis GI ppx: PPI Advanced Care Planning: Full Code  For questions or updates, please contact [facility] consult [email or web address] for contact info under       Signed, ChDonato HeinzMD  03/25/2020, 9:52 AM  \

## 2020-03-25 NOTE — TOC Progression Note (Signed)
Transition of Care [facility]) - Progression Note    Patient Details  Name: Charles Gray MRN: [id number] Date of Birth: October 02, 1955  [facility] CM/SW Contact  Zenon Mayo, RN Phone Number: 03/25/2020, 10:05 AM  Clinical Narrative:    NCM spoke with patient, he is active with [facility] for [place], NCM confirmed with Helene Kelp.  Patient would like to continuie with them.  Will need resumption orders.  He also states that he has a Therapist, occupational and he uses [facility] transportation.         Expected Discharge Plan and Services                                                 Social Determinants of Health (SDOH) Interventions    Readmission Risk Interventions Readmission Risk Prevention Plan 02/02/2020 08/28/2019 08/08/2019  Transportation Screening Complete Complete Complete  HRI or Home Care Consult - - Complete  Social Work Consult for [place] Planning/Counseling - - Complete  Medication Review Press photographer) Complete Complete Complete  HRI or Home Care Consult Complete - -  SW Recovery Care/Counseling Consult Complete Complete -  Palliative Care Screening Not Applicable Not Applicable -  [place] Not Applicable Not Applicable -

## 2020-03-25 NOTE — Plan of Care (Signed)

## 2020-03-26 ENCOUNTER — Encounter (HOSPITAL_COMMUNITY): Payer: Self-pay | Admitting: Cardiology

## 2020-03-26 ENCOUNTER — Other Ambulatory Visit: Payer: Self-pay

## 2020-03-26 DIAGNOSIS — I4891 Unspecified atrial fibrillation: Secondary | ICD-10-CM | POA: Diagnosis not present

## 2020-03-26 DIAGNOSIS — I110 Hypertensive heart disease with heart failure: Secondary | ICD-10-CM | POA: Diagnosis not present

## 2020-03-26 DIAGNOSIS — R002 Palpitations: Secondary | ICD-10-CM | POA: Diagnosis not present

## 2020-03-26 DIAGNOSIS — I5033 Acute on chronic diastolic (congestive) heart failure: Secondary | ICD-10-CM | POA: Diagnosis not present

## 2020-03-26 LAB — BASIC METABOLIC PANEL
Anion gap: 11 (ref 5–15)
Anion gap: 13 (ref 5–15)
BUN: 25 mg/dL — ABNORMAL HIGH (ref 8–23)
BUN: 23 mg/dL (ref 8–23)
CO2: 32 mmol/L (ref 22–32)
CO2: 31 mmol/L (ref 22–32)
Calcium: 9.6 mg/dL (ref 8.9–10.3)
Calcium: 9.5 mg/dL (ref 8.9–10.3)
Chloride: 91 mmol/L — ABNORMAL LOW (ref 98–111)
Chloride: 89 mmol/L — ABNORMAL LOW (ref 98–111)
Creatinine, Ser: 1.23 mg/dL (ref 0.61–1.24)
Creatinine, Ser: 1.17 mg/dL (ref 0.61–1.24)
GFR, Estimated: >60 mL/min (ref >60)
GFR, Estimated: >60 mL/min (ref >60)
Glucose, Bld: 107 mg/dL — ABNORMAL HIGH (ref 70–99)
Glucose, Bld: 120 mg/dL — ABNORMAL HIGH (ref 70–99)
Potassium: 4 mmol/L (ref 3.5–5.1)
Potassium: 3.2 mmol/L — ABNORMAL LOW (ref 3.5–5.1)
Sodium: 134 mmol/L — ABNORMAL LOW (ref 135–145)
Sodium: 133 mmol/L — ABNORMAL LOW (ref 135–145)

## 2020-03-26 LAB — MAGNESIUM: Magnesium: 2.3 mg/dL (ref 1.7–2.4)

## 2020-03-26 LAB — CBC
HCT: 41.8 % (ref 39.0–52.0)
Hemoglobin: 12.8 g/dL — ABNORMAL LOW (ref 13.0–17.0)
MCH: 24.7 pg — ABNORMAL LOW (ref 26.0–34.0)
MCHC: 30.6 g/dL (ref 30.0–36.0)
MCV: 80.5 fL (ref 80.0–100.0)
Platelets: 182 10*3/uL (ref 150–400)
RBC: 5.19 MIL/uL (ref 4.22–5.81)
RDW: 15.3 % (ref 11.5–15.5)
WBC: 9.9 10*3/uL (ref 4.0–10.5)
nRBC: 0 % (ref 0.0–0.2)

## 2020-03-26 MED ORDER — METOPROLOL TARTRATE 50 MG PO TABS
50.0000 mg | ORAL_TABLET | Freq: Two times a day (BID) | ORAL | Status: DC
  Administered 2020-03-26 – 2020-03-27 (×3): 50 mg via ORAL
  Filled 2020-03-26 (×3): qty 1

## 2020-03-26 MED ORDER — BUMETANIDE 2 MG PO TABS
3.0000 mg | ORAL_TABLET | Freq: Two times a day (BID) | ORAL | Status: DC
  Administered 2020-03-26 – 2020-03-27 (×3): 3 mg via ORAL
  Filled 2020-03-26 (×4): qty 1

## 2020-03-26 NOTE — Progress Notes (Signed)
Pt states he would like to hold off on his Spironolactone until he speaks to his MD in regard to the medication. [place] PA notified

## 2020-03-26 NOTE — Discharge Instructions (Signed)

## 2020-03-26 NOTE — Progress Notes (Signed)
Pt states that the RUQ and the medial aspect of the URQ and ULQ hurts. Pt states that he feels like "crap". Bhagat PA paged. No new orders, will continue to monitor

## 2020-03-26 NOTE — Progress Notes (Signed)
Pt stated he was feeling "dizzy" and not feeling fine as well. Pt appeared to be sitting in the chair with unlabored breathing and orientated according to his baseline. Pt was assisted back to his bed with his walker. Orthostatic vitals were taken as follow:     03/26/20 1156  Orthostatic Lying   BP- Lying 103/57  Pulse- Lying 58  Orthostatic Sitting  BP- Sitting 110/67  Pulse- Sitting 68  Orthostatic Standing at 0 minutes  BP- Standing at 0 minutes 99/67  Pulse- Standing at 0 minutes 67  Orthostatic Standing at 3 minutes  BP- Standing at 3 minutes 102/68  Pulse- Standing at 3 minutes 70   Baghad PA paged to make aware/  Pt is currently sitting in his bed and eating his lunch. Call light is within reach and urinal as well. Will continue to monitor

## 2020-03-26 NOTE — Progress Notes (Addendum)
Progress Note  Patient Name: Charles Gray Date of Encounter: 03/26/2020  [facility] Cardiologist: Berniece Salines, DO   Subjective   Breathing improving but not at baseline yet.  No chest pain.  Inpatient Medications    Scheduled Meds:  apixaban  5 mg Oral BID   bumetanide  3 mg Oral BID   buprenorphine  8 mg Sublingual BID AC & HS   gabapentin  800 mg Oral TID   metoprolol tartrate  50 mg Oral BID   pantoprazole  40 mg Oral BID   potassium chloride SA  40 mEq Oral BID   pravastatin  20 mg Oral Daily   sodium chloride flush  3 mL Intravenous Q12H   spironolactone  100 mg Oral Daily   Continuous Infusions:  sodium chloride     PRN Meds: sodium chloride, acetaminophen, albuterol, ondansetron (ZOFRAN) IV, polyethylene glycol, sodium chloride flush, zolpidem   Vital Signs    Vitals:   03/25/20 1951 03/26/20 0044 03/26/20 0431 03/26/20 0857  BP: 119/79  123/85 103/69  Pulse: 72  69 66  Resp: 19  18   Temp: 98.5 F (36.9 C)  97.7 F (36.5 C)   TempSrc: Oral  Oral   SpO2: 99%  99%   Weight:  (!) 166.2 kg    Height:        Intake/Output Summary (Last 24 hours) at 03/26/2020 0951 Last data filed at 03/26/2020 0813 Gross per 24 hour  Intake 1635 ml  Output 3200 ml  Net -1565 ml   Last 3 Weights 03/26/2020 03/25/2020 03/24/2020  Weight (lbs) 366 lb 6.4 oz 370 lb 372 lb  Weight (kg) 166.198 kg 167.831 kg 168.738 kg      Telemetry    Maintaining sinus rhythm at 60-70- Personally Reviewed  ECG  No new tracing Physical Exam   GEN:  Morbidly obese male in no acute distress.   Neck:  Difficult to evaluate JVD due to body habitus Cardiac: RRR, no murmurs, rubs, or gallops.  Respiratory: Clear to auscultation bilaterally. GI: Soft, nontender, non-distended  MS:  Trace to 1+ bilateral lower extremity edema; No deformity. Neuro:  Nonfocal  Psych: Normal affect   Labs    High Sensitivity Troponin:   Recent Labs  Lab 03/24/20 1727  03/24/20 2014  TROPONINIHS 7 7      Chemistry Recent Labs  Lab 03/25/20 0635 03/25/20 1705 03/26/20 0418  NA 137 133* 134*  K 3.4* 3.0* 4.0  CL 94* 87* 91*  CO2 31 31 32  GLUCOSE 111* 108* 107*  BUN 18 21 25*  CREATININE 1.14 1.30* 1.23  CALCIUM 9.1 9.4 9.6  GFRNONAA >60 >60 >60  ANIONGAP 12 15 11      Hematology Recent Labs  Lab 03/24/20 1723 03/26/20 0418  WBC 8.5 9.9  RBC 5.03 5.19  HGB 12.4* 12.8*  HCT 42.6 41.8  MCV 84.7 80.5  MCH 24.7* 24.7*  MCHC 29.1* 30.6  RDW 15.7* 15.3  PLT 167 182    BNP Recent Labs  Lab 03/24/20 1727  BNP 299.9*     DDimer No results for input(s): DDIMER in the last 168 hours.   Radiology    ECHOCARDIOGRAM COMPLETE  Result Date: 03/25/2020    ECHOCARDIOGRAM REPORT   Patient Name:   Charles Gray Date of Exam: 03/25/2020 Medical Rec #:  [id number]       Height:       82.0 in Accession #:    [id number]  Weight:       370.0 lb Date of Birth:  11/10/1955       BSA:          3.041 m Patient Age:    64 years        BP:           106/62 mmHg Patient Gender: M               HR:           71 bpm. Exam Location:  Inpatient Procedure: 2D Echo, Cardiac Doppler, Color Doppler and Intracardiac            Opacification Agent Indications:    I50.33 Acute on chronic diastolic (congestive) heart failure  History:        Patient has prior history of Echocardiogram examinations, most                 recent 08/07/2019. Stroke, Arrythmias:Atrial Fibrillation,                 Signs/Symptoms:Dyspnea and Chest Pain; Risk                 Factors:Hypertension, Dyslipidemia and Sleep Apnea. Pulmonary                 Embolism.  Sonographer:    Jonelle Sidle Dance Referring Phys: [id number] [place]  1. Left ventricular ejection fraction, by estimation, is 60 to 65%. The left ventricle has normal function. The left ventricle has no regional wall motion abnormalities. Left ventricular diastolic parameters are consistent with Grade II diastolic  dysfunction (pseudonormalization).  2. Right ventricular systolic function was not well visualized. The right ventricular size is not well visualized.  3. The mitral valve was not well visualized.  4. The aortic valve was not well visualized. Aortic valve regurgitation is not visualized. No aortic stenosis is present.  5. Aortic dilatation noted. There is mild dilatation of the aortic root, measuring 38 mm.  6. Very poor echo images. LV marginally visualized, EF appears normal. FINDINGS  Left Ventricle: Left ventricular ejection fraction, by estimation, is 60 to 65%. The left ventricle has normal function. The left ventricle has no regional wall motion abnormalities. Definity contrast agent was given IV to delineate the left ventricular  endocardial borders. The left ventricular internal cavity size was normal in size. There is no left ventricular hypertrophy. Left ventricular diastolic parameters are consistent with Grade II diastolic dysfunction (pseudonormalization). Right Ventricle: The right ventricular size is not well visualized. Right vetricular wall thickness was not well visualized. Right ventricular systolic function was not well visualized. Left Atrium: Left atrial size was not assessed. Right Atrium: Right atrial size was not assessed. Pericardium: The pericardium was not well visualized. Mitral Valve: The mitral valve was not well visualized. No evidence of mitral valve regurgitation. Tricuspid Valve: The tricuspid valve is not well visualized. Tricuspid valve regurgitation is not demonstrated. Aortic Valve: The aortic valve was not well visualized. Aortic valve regurgitation is not visualized. No aortic stenosis is present. Pulmonic Valve: The pulmonic valve was not well visualized. Pulmonic valve regurgitation is not visualized. Aorta: Aortic dilatation noted. There is mild dilatation of the aortic root, measuring 38 mm. Venous: The inferior vena cava was not well visualized. IAS/Shunts: The  interatrial septum was not assessed.  LEFT VENTRICLE PLAX 2D LVIDd:         4.60 cm  Diastology LVIDs:         4.50 cm  LV e' medial:    8.27 cm/s LV PW:         1.50 cm  LV E/e' medial:  6.5 LV IVS:        1.30 cm  LV e' lateral:   11.20 cm/s LVOT diam:     2.30 cm  LV E/e' lateral: 4.8 LV SV:         56 LV SV Index:   18 LVOT Area:     4.15 cm  IVC IVC diam: 1.70 cm LEFT ATRIUM           Index LA diam:      4.00 cm 1.32 cm/m LA Vol (A4C): 35.5 ml 11.67 ml/m  AORTIC VALVE LVOT Vmax:   71.80 cm/s LVOT Vmean:  47.000 cm/s LVOT VTI:    0.135 m  AORTA Ao Root diam: 3.80 cm MITRAL VALVE MV Area (PHT): 2.00 cm    SHUNTS MV Decel Time: 380 msec    Systemic VTI:  0.14 m MV E velocity: 54.00 cm/s  Systemic Diam: 2.30 cm MV A velocity: 48.40 cm/s MV E/A ratio:  1.12 Loralie Champagne MD Electronically signed by Loralie Champagne MD Signature Date/Time: 03/25/2020/4:55:51 PM    Final     Cardiac Studies   Echo 03/25/2020 1. Left ventricular ejection fraction, by estimation, is 60 to 65%. The  left ventricle has normal function. The left ventricle has no regional  wall motion abnormalities. Left ventricular diastolic parameters are  consistent with Grade II diastolic  dysfunction (pseudonormalization).  2. Right ventricular systolic function was not well visualized. The right  ventricular size is not well visualized.  3. The mitral valve was not well visualized.  4. The aortic valve was not well visualized. Aortic valve regurgitation  is not visualized. No aortic stenosis is present.  5. Aortic dilatation noted. There is mild dilatation of the aortic root,  measuring 38 mm.  6. Very poor echo images. LV marginally visualized, EF appears normal.   Patient Profile     64 y.o. 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   Assessment & Plan    1.  Acute on chronic diastolic heart failure -Suspected due to underlying untreated obstructive sleep apnea leading to recurrent heart failure exacerbation -Goal weight is around 360 lb. - Spironolactone substituted for Eplerenone 137m during admit (patient reported breast enlargement on spironolactone as outpatient>> however he is willing to take spironolactone while admitted) -Net I&O -2.4 L -Total weight down 4 LB (370>>366lb) - Lasix switched to Bumex 368mBID  - Continue Metoprolol 5037mID - Renal function stable   2. OSA non compliant - Discussed diet and weight  - Will set up appointment as outpatient.   3. PAF - Converted to sinus with one dose of Flecainide.  - Maintaining sinus rhythm - Continue BB and Eliquis   For questions or updates, please contact [facility] consult [email or web address] for contact info under        Signed, BLeanor KailA  03/26/2020, 9:51 AM    Patient seen and examined.  Agree with above documentation.  On exam, patient is alert and oriented, regular rate and rhythm, no murmurs, lungs CTAB, no LE edema.  Telemetry shows no further Afib.  Converted to PO diuretics today, likely home tomorrow if tolerating.  ChrDonato HeinzD

## 2020-03-26 NOTE — Evaluation (Addendum)
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 Presents to ER with chest pain, SOB, and increased leg swelling. Cardiology has been consulted for evaluation of atrial fibrillation with RVR.   Clinical Impression   PTA, pt was living alone and was performing ADLs and light IADLs; reports progressive weakness impacting his performance of IADLs. Pt also receiving HHRN and HHPT with recent dc from [place]. Pt currently performing ADLs and functional mobility at Supervision level with RW. Pt with fatigue and requiring several seated rest breaks during basic activities. HR 80s. Pt would benefit from further acute OT to facilitate safe dc. Recommend dc to home once medically stable with PCA for performance of IADLs including cooking and cleaning.    Follow Up Recommendations  No OT follow up;Supervision - Intermittent (PCA for IADLs)    Equipment Recommendations  None recommended by OT    Recommendations for Other Services PT consult     Precautions / Restrictions Precautions Precautions: Fall      Mobility Bed Mobility               General bed mobility comments: Sitting EOB upon arrival    Transfers Overall transfer level: Needs assistance Equipment used: Rolling walker (2 wheeled) Transfers: Sit to/from Stand Sit to Stand: Supervision         General transfer comment: Supervision for safety    Balance Overall balance assessment: Mild deficits observed, not formally tested                                         ADL either performed or assessed with clinical judgement   ADL Overall ADL's : Needs  assistance/impaired Eating/Feeding: Set up;Sitting   Grooming: Set up;Sitting;Standing;Supervision/safety;Oral care   Upper Body Bathing: Set up;Supervision/ safety;Sitting   Lower Body Bathing: Supervison/ safety;Set up;Sitting/lateral leans   Upper Body Dressing : Supervision/safety;Set up;Sitting   Lower Body Dressing: Supervision/safety;Set up;Sit to/from stand Lower Body Dressing Details (indicate cue type and reason): Supervision for safety and pt doffing socks Toilet Transfer: Set up;Supervision/safety;Ambulation;Regular Toilet           Functional mobility during ADLs: Supervision/safety;Rolling walker General ADL Comments: Pt performing ADLs and functional mobility at Supervision level. Pt with decreased activity tolerance. Fatigues and requires seated rest breaks.     Vision         Perception     Praxis      Pertinent Vitals/Pain Pain Assessment: Faces Faces Pain Scale: Hurts a little bit Pain Location: stomach, back Pain Descriptors / Indicators: Discomfort;Grimacing;Guarding Pain Intervention(s): Monitored during session;Limited activity within patient's tolerance;Repositioned     Hand Dominance Right   Extremity/Trunk Assessment Upper Extremity Assessment Upper Extremity Assessment: Overall WFL for tasks assessed   Lower Extremity Assessment Lower Extremity Assessment: Defer to PT evaluation   Cervical / Trunk Assessment Cervical / Trunk Assessment: Other exceptions Cervical / Trunk Exceptions: Forward flexion at back   Communication Communication Communication: No difficulties   Cognition Arousal/Alertness: Awake/alert Behavior During Therapy: WFL for tasks assessed/performed Overall Cognitive Status: Within Functional Limits for tasks assessed  General Comments  HR 80s    Exercises     Shoulder Instructions      Home Living Family/patient expects to be discharged to:: Private  residence Living Arrangements: Alone Available Help at Discharge: Family;Available PRN/intermittently Type of Home: Apartment Home Access: Stairs to enter Entrance Stairs-Number of Steps:  (flight) Entrance Stairs-Rails: Right;Left;Can reach both Home Layout: One level     Bathroom Shower/Tub: Teacher, early years/pre: Standard Bathroom Accessibility: Yes   Home Equipment: Tub bench;Walker - 2 wheels;Hand held shower head          Prior Functioning/Environment Level of Independence: Needs assistance  Gait / Transfers Assistance Needed: Using walker, denies falls ADL's / Homemaking Assistance Needed: Orders groceries from [employer] vs "[employer]", has difficulty with cleaning/cooking   Comments: Has HHRN 1x/wk        OT Problem List: Decreased strength;Impaired balance (sitting and/or standing);Decreased activity tolerance;Decreased knowledge of precautions;Decreased knowledge of use of DME or AE;Pain      OT Treatment/Interventions: Self-care/ADL training;Therapeutic exercise;Energy conservation;DME and/or AE instruction;Therapeutic activities;Patient/family education    OT Goals(Current goals can be found in the care plan section) Acute Rehab OT Goals Patient Stated Goal: Get stronger and have a PCA for IADLs OT Goal Formulation: With patient Time For Goal Achievement: 04/09/20 Potential to Achieve Goals: Good  OT Frequency: Min 2X/week   Barriers to D/C:            Co-evaluation              AM-PAC OT "6 Clicks" Daily Activity     Outcome Measure Help from another person eating meals?: None Help from another person taking care of personal grooming?: A Little Help from another person toileting, which includes using toliet, bedpan, or urinal?: A Little Help from another person bathing (including washing, rinsing, drying)?: A Little Help from another person to put on and taking off regular upper body clothing?: A Little Help from another person to  put on and taking off regular lower body clothing?: A Little 6 Click Score: 19   End of Session Equipment Utilized During Treatment: Rolling walker;Gait belt Nurse Communication: Mobility status  Activity Tolerance: Patient tolerated treatment well Patient left: in bed;with call bell/phone within reach (EOB)  OT Visit Diagnosis: Unsteadiness on feet (R26.81);Other abnormalities of gait and mobility (R26.89);Muscle weakness (generalized) (M62.81);Pain Pain - part of body:  (Back)                Time: 3557-3220 OT Time Calculation (min): 37 min Charges:  OT General Charges $OT Visit: 1 Visit OT Evaluation $OT Eval Low Complexity: 1 Low OT Treatments $Self Care/Home Management : 8-22 mins  Mousa Prout MSOT, OTR/L Acute Rehab Pager: [phone] Office: [place] 03/26/2020, 8:48 AM

## 2020-03-26 NOTE — Progress Notes (Signed)
Physical Therapy Treatment Patient Details Name: Charles Gray MRN: [id number] DOB: [birth year] Today's Date: 03/26/2020    History of Present Illness Scottie Sebesta is a 64 year old male with obesity, mild COPD, [name], DVT/PE (05/2019, on Eliquis), paroxysmal AF, hypertension, untreated OSA, insomnia, coronary artery disease by CCTA, chronic pain, upper GI bleed (Fall 2020), and HFpEF. Presents to ER with chest pain, SOB, and increased leg swelling. Cardiology has been consulted for evaluation of atrial fibrillation with RVR.    PT Comments    Pt progressing steadily towards his physical therapy goals, exhibiting improved activity tolerance and ambulation distance this session. Pt ambulating 240 feet with a walker at a supervision level; required 2 short standing rest breaks. Would benefit from stair training next session.    Follow Up Recommendations  Home health PT     Equipment Recommendations  None recommended by PT    Recommendations for Other Services       Precautions / Restrictions Precautions Precautions: Fall Restrictions Weight Bearing Restrictions: No    Mobility  Bed Mobility               General bed mobility comments: Sitting in chair upon arrival  Transfers Overall transfer level: Modified independent Equipment used: None                Ambulation/Gait Ambulation/Gait assistance: Supervision Gait Distance (Feet): 240 Feet Assistive device: Rolling walker (2 wheeled) Gait Pattern/deviations: Step-through pattern;Decreased stride length;Trunk flexed Gait velocity: decreased   General Gait Details: Pt pausing to take ~2 short standing rest breaks, steady pace otherwise, tendency for downward gaze and rounded shoulders   Stairs             Wheelchair Mobility    Modified Rankin (Stroke Patients Only)       Balance Overall balance assessment: Mild deficits observed, not formally tested                                           Cognition Arousal/Alertness: Awake/alert Behavior During Therapy: WFL for tasks assessed/performed Overall Cognitive Status: Within Functional Limits for tasks assessed                                        Exercises      General Comments        Pertinent Vitals/Pain Pain Assessment: Faces Faces Pain Scale: Hurts a little bit Pain Location: back Pain Descriptors / Indicators: Discomfort;Grimacing Pain Intervention(s): Monitored during session    Home Living                      Prior Function            PT Goals (current goals can now be found in the care plan section) Acute Rehab PT Goals Patient Stated Goal: get stronger PT Goal Formulation: With patient Time For Goal Achievement: 04/08/20 Potential to Achieve Goals: Good Progress towards PT goals: Progressing toward goals    Frequency    Min 3X/week      PT Plan Current plan remains appropriate    Co-evaluation              AM-PAC PT "6 Clicks" Mobility   Outcome Measure  Help needed turning from your back to your side while  in a flat bed without using bedrails?: None Help needed moving from lying on your back to sitting on the side of a flat bed without using bedrails?: None Help needed moving to and from a bed to a chair (including a wheelchair)?: None Help needed standing up from a chair using your arms (e.g., wheelchair or bedside chair)?: None Help needed to walk in hospital room?: None Help needed climbing 3-5 steps with a railing? : A Little 6 Click Score: 23    End of Session   Activity Tolerance: Patient tolerated treatment well Patient left: in chair;with call bell/phone within reach Nurse Communication: Mobility status PT Visit Diagnosis: Difficulty in walking, not elsewhere classified (R26.2)     Time: 4174-0814 PT Time Calculation (min) (ACUTE ONLY): 20 min  Charges:  $Therapeutic Activity: 8-22 mins                      Wyona Almas, PT, DPT Acute Rehabilitation Services Pager [phone] Office [phone]    Deno Etienne 03/26/2020, 2:15 PM

## 2020-03-27 DIAGNOSIS — K766 Portal hypertension: Secondary | ICD-10-CM | POA: Diagnosis not present

## 2020-03-27 DIAGNOSIS — I5043 Acute on chronic combined systolic (congestive) and diastolic (congestive) heart failure: Secondary | ICD-10-CM | POA: Diagnosis not present

## 2020-03-27 DIAGNOSIS — R002 Palpitations: Secondary | ICD-10-CM | POA: Diagnosis not present

## 2020-03-27 DIAGNOSIS — I5033 Acute on chronic diastolic (congestive) heart failure: Secondary | ICD-10-CM | POA: Diagnosis not present

## 2020-03-27 DIAGNOSIS — Z6841 Body Mass Index (BMI) 40.0 and over, adult: Secondary | ICD-10-CM | POA: Diagnosis not present

## 2020-03-27 DIAGNOSIS — I110 Hypertensive heart disease with heart failure: Secondary | ICD-10-CM | POA: Diagnosis not present

## 2020-03-27 DIAGNOSIS — I4891 Unspecified atrial fibrillation: Secondary | ICD-10-CM | POA: Diagnosis not present

## 2020-03-27 LAB — BASIC METABOLIC PANEL
Anion gap: 14 (ref 5–15)
BUN: 27 mg/dL — ABNORMAL HIGH (ref 8–23)
CO2: 34 mmol/L — ABNORMAL HIGH (ref 22–32)
Calcium: 9.6 mg/dL (ref 8.9–10.3)
Chloride: 87 mmol/L — ABNORMAL LOW (ref 98–111)
Creatinine, Ser: 1.21 mg/dL (ref 0.61–1.24)
GFR, Estimated: >60 mL/min (ref >60)
Glucose, Bld: 115 mg/dL — ABNORMAL HIGH (ref 70–99)
Potassium: 4 mmol/L (ref 3.5–5.1)
Sodium: 135 mmol/L (ref 135–145)

## 2020-03-27 LAB — MAGNESIUM: Magnesium: 2.3 mg/dL (ref 1.7–2.4)

## 2020-03-27 NOTE — Care Management Important Message (Signed)
Important Message  Patient Details  Name: Charles Gray MRN: [id number] Date of Birth: 09/10/1955   Medicare Important Message Given:  Yes  Patient left prior to IM delivery  Dawud Mays 03/27/2020, 3:33 PM

## 2020-03-27 NOTE — Progress Notes (Signed)
D/C instructions given and reviewed. No questions asked but encouraged to call with concerns. Tele and IV removed, tolerated well.

## 2020-03-27 NOTE — Progress Notes (Signed)
  Mobility Specialist Criteria Algorithm Info.   Mobility Team: HOB elevated: Activity: Ambulated in hall; Dangled on edge of bed Range of motion: Active; All extremities Level of assistance: Contact guard assist, steadying assist Assistive device: Front wheel walker Minutes sitting in chair:  Minutes stood: 8 minutes Minutes ambulated: 8 minutes Distance ambulated (ft): 220 ft Mobility response: Tolerated fair; RN notified Bed Position: Semi-fowlers  Pt not feeling well this morning but still agreed to participate in mobility. Voiced concern about having random episodes of CP since receiving an echo days prior. He ambulated in hallway 220 feet with supervision and RW. Required multiple standing rest breaks secondary to fatigue and lower back pain. While returning to room patient stopped and complained of 7/10 LUQ or chest pain that radiated through LUE. VSS and denied dizziness, nausea, or SOB. Pain subsided after about 10 minutes of seated rest. Patient very anxious and concerned random episodes of CP. RN was notified. Patient is now dangling EOB with all needs met and no further complaints.  HR Pre: 59 HR During: 64 HR Post: 61  03/27/2020 11:20 AM

## 2020-03-27 NOTE — TOC Initial Note (Signed)
Transition of Care [facility]) - Initial/Assessment Note    Patient Details  Name: Charles Gray MRN: [id number] Date of Birth: 12/19/1955  [facility]) CM/SW Contact:    Zenon Mayo, RN Phone Number: 03/27/2020, 11:52 AM  Clinical Narrative:                 NCM spoke with patient, he is active with [facility] for [place], NCM confirmed with Helene Kelp.  Patient would like to continuie with them.  Will need resumption orders.  He also states that he has a Therapist, occupational and he uses [facility] transportation.  NCM notified Helene Kelp with [facility] patient if for dc today.  Expected Discharge Plan: [place] Barriers to Discharge: No Barriers Identified   Patient Goals and CMS Choice        Expected Discharge Plan and Services Expected Discharge Plan: [place]   Discharge Planning Services: CM Consult Post Acute Care Choice: Resumption of Svcs/PTA Provider Living arrangements for the past 2 months: Single Family Home Expected Discharge Date: 03/27/20                         HH Arranged: RN,PT [place] Agency: [facility] (formerly [employer]) Date [place]: 03/27/20 Time [place]: 71 Representative spoke with at [place]: [place] Arrangements/Services Living arrangements for the past 2 months: [place]   Patient language and need for interpreter reviewed:: Yes Do you feel safe going back to the place where you live?: Yes      Need for Family Participation in Patient Care: Yes (Comment) Care giver support system in place?: Yes (comment)   Criminal Activity/Legal Involvement Pertinent to Current Situation/Hospitalization: No - Comment as needed  Activities of Daily Living Home Assistive Devices/Equipment: Walker (specify type),Cane (specify quad or straight) ADL Screening (condition at time of admission) Patient's cognitive ability adequate to safely complete daily  activities?: Yes Is the patient deaf or have difficulty hearing?: No Does the patient have difficulty seeing, even when wearing glasses/contacts?: No Does the patient have difficulty concentrating, remembering, or making decisions?: No Patient able to express need for assistance with ADLs?: Yes Does the patient have difficulty dressing or bathing?: No Independently performs ADLs?: Yes (appropriate for developmental age) Does the patient have difficulty walking or climbing stairs?: Yes Weakness of Legs: Both Weakness of Arms/Hands: None  Permission Sought/Granted                  Emotional Assessment Appearance:: Appears stated age Attitude/Demeanor/Rapport: Engaged Affect (typically observed): Appropriate Orientation: : Oriented to Situation,Oriented to  Time,Oriented to Place,Oriented to Self Alcohol / Substance Use: Not Applicable Psych Involvement: No (comment)  Admission diagnosis:  Atrial fibrillation with rapid ventricular response ([place]) [I48.91] Patient Active Problem List   Diagnosis Date Noted  . Atrial fibrillation with rapid ventricular response ([place]) 03/24/2020  . Acute on chronic right heart failure ([place]) 01/27/2020  . Acute respiratory failure with hypoxia ([place])   . Fatty liver disease, nonalcoholic   . Shock ([place])   . Personal history of DVT (deep vein thrombosis) 12/26/2019  . Chronic anticoagulation 12/26/2019  . Palpitations 12/26/2019  . Atrial fibrillation ([place]) 12/23/2019  . Heart failure, systolic, acute ([place]) 86/75/4492  . Acute on chronic congestive heart failure ([place])   . Acute on chronic diastolic CHF (congestive heart failure) ([place]) 08/23/2019  . Redness and swelling of lower leg  08/23/2019  . Chest pain 08/23/2019  . Lactic acidosis 08/23/2019  . History of pulmonary embolism 08/12/2019  . Atrial tachycardia ([place]) 08/12/2019  . Occasional tremors 08/12/2019  . OSA on CPAP 08/12/2019  . Acute on chronic right-sided heart failure ([place])  08/06/2019  . Pulmonary embolism ([place]) 06/08/2019  . Normocytic anemia 06/08/2019  . Pulmonary emboli ([place]) 06/08/2019  . Chronic heart failure with preserved ejection fraction ([place]) 05/30/2019  . Class 2 severe obesity due to excess calories with serious comorbidity and body mass index (BMI) of 39.0 to 39.9 in adult ([place]) 05/30/2019  . Morbid obesity with BMI of 40.0-44.9, adult ([place]) 05/30/2019  . Hyponatremia   . Benign essential HTN   . Drug induced constipation   . Peripheral edema   . Hypotension due to drugs   . Hypoalbuminemia due to protein-calorie malnutrition ([place])   . Hypokalemia   . Thrombocytopenia ([place])   . Anemia of chronic disease   . Cirrhosis of liver without ascites ([place])   . Chronic pain syndrome   . Debility 03/13/2019  . Portal hypertensive gastropathy ([place]) 03/06/2019  . Dyspnea   . GIB (gastrointestinal bleeding) 03/04/2019  . Mixed hyperlipidemia 01/24/2019  . Insomnia 01/24/2019  . Hyperlipidemia 01/24/2019  . Hypertension 01/24/2019  . Lumbar disc herniation 01/24/2019   PCP:  Enid Skeens., MD Pharmacy:   [facility], [place] [address] Phone: [phone] Fax: [place], [place] [address] [place] [address] Phone: [phone] Fax: [phone]     Social Determinants of Health (SDOH) Interventions    Readmission Risk Interventions Readmission Risk Prevention Plan 03/27/2020 02/02/2020 08/28/2019  Transportation Screening Complete Complete Complete  HRI or JAARS for [place] Planning/Counseling - - -  Medication Review Press photographer) Complete Complete Complete  PCP or Specialist appointment within 3-5 days of discharge Complete - -  [place] or [place] Complete Complete -  SW Recovery Care/Counseling Consult Complete Complete Complete   Palliative Care Screening Not Applicable Not Applicable Not [place] Not Applicable Not Applicable Not Applicable

## 2020-03-27 NOTE — Discharge Summary (Addendum)
Discharge Summary    Patient ID: Charles Gray MRN: [id number]; DOB: 06-14-1955  Admit date: 03/24/2020 Discharge date: 03/27/2020  Primary Care Provider: Enid Skeens., MD  Primary Cardiologist: Berniece Salines, DO   Discharge Diagnoses    Principal Problem:   Atrial fibrillation with rapid ventricular response [facility]) Active Problems:   Mixed hyperlipidemia   Benign essential HTN   Class 2 severe obesity due to excess calories with serious comorbidity and body mass index (BMI) of 39.0 to 39.9 in adult ([place])   OSA on CPAP    Diagnostic Studies/Procedures    Echo 03/25/2020 1. Left ventricular ejection fraction, by estimation, is 60 to 65%. The  left ventricle has normal function. The left ventricle has no regional  wall motion abnormalities. Left ventricular diastolic parameters are  consistent with Grade II diastolic  dysfunction (pseudonormalization).  2. Right ventricular systolic function was not well visualized. The right  ventricular size is not well visualized.  3. The mitral valve was not well visualized.  4. The aortic valve was not well visualized. Aortic valve regurgitation  is not visualized. No aortic stenosis is present.  5. Aortic dilatation noted. There is mild dilatation of the aortic root,  measuring 38 mm.  6. Very poor echo images. LV marginally visualized, EF appears normal.    History of Present Illness     Charles Gray is a 64 y.o. male with hx of morbid obesity, mild COPD, [name], DVT/PE (05/2019, on Eliquis), paroxysmal AF, hypertension, untreated OSA, insomnia, coronary artery disease by CCTA, chronic pain, upper GI bleed (Fall 2020), and chronic diastolic CHF.  [place] 07/2019 showed normal PA pressure and normal cardiac output. PFTs also completed and c/w mild obstructive airway disease.   Coronary CTA 7/2021showed no coronary calcium but there was a concern for soft plaque in the distal LCx and distal RCA with hemodynamically significant  stenosis.  He was managed medically. He has not had any chest pain, but was started on ranolazine after his CTA.  He feels like it makes his "head fuzzy."    Admitted for heart failure 01/2020, required metolazone for diuresis and was discharged on Bumex 17m BID, aldactone 103m and metolazone prn.   His Aldactone changed to Eplerenone as outpatient 2nd to breast enlargement.   Presented with 1 week hx of leg edema and more SOB then usual. About 10lb weight gain. He started to feel heart racing, more short of breath, fatigued, and dizzy with exertion so he came to the ED for further evaluation.  He has been compliant with dietary restriction and medications. Doesn't use CPAP because he has insomnia and only doses off intermittently during the day, never consistently sleeps.   He was found to be in afib RVR at 140s. He was started on IV cardizem with improved rate.   Hospital Course     Consultants: None  1. Acute on chronic diastolic heart failure -Suspected due to underlying untreated obstructive sleep apnea leading to recurrent heart failure exacerbation - Goal weight is around 360 lb. - Spironolactone substituted for Eplerenone 10050muring admit (patient reported breast enlargement on spironolactone as outpatient>> however he is willing to take spironolactone while admitted) however had chest pain. See below. -Net I&O negative 5.7L -Total weight down 5 LB (370>>365lb)>> baseline around 360lb - Lasix switched to Bumex 3mg66mD  - Continue Metoprolol 50mg65m - Renal function stable  - He was not taking his Losartan or metolazone prior to admit. Readdress as outpatient.  2. OSA  non compliant - Discussed diet and weight  - Will need appointment as outpatient with Dr. Radford Pax  3. PAF - Converted to sinus with one dose of Flecainide. Stopped Cardizem.  - Maintaining sinus rhythm - Continue BB and Eliquis  4. Chest discomfort with hx of CAD by coronary CT - Patient reported LUQ pain  after echo but resolved spontaneously. Some chest discomfort intermittently. Felt his symptoms could be due to restarting spironolactone while admitted (recommended avoiding in future). He have CAD with Coronary CTA 10/2019 showed flow limiting lesions in the distal LCX and distal RCA>> recommended medical therapy.  - Continue BB and Pravastatin (consider change to stronger agent as outpatient) - Not on ASA due to need of anticoagulation - EKG non ischemic - If ongoing pain as outpatient, add antianginal or consider LHC    Did the patient have an acute coronary syndrome (MI, NSTEMI, STEMI, etc) this admission?:  No                               Did the patient have a percutaneous coronary intervention (stent / angioplasty)?:  No.     Discharge Vitals Blood pressure 114/76, pulse 67, temperature 98.8 F (37.1 C), temperature source Oral, resp. rate 18, height 6' 10"  (2.083 m), weight (!) 165.7 kg, SpO2 100 %.  Filed Weights   03/25/20 0408 03/26/20 0044 03/27/20 0428  Weight: (!) 167.8 kg (!) 166.2 kg (!) 165.7 kg    Labs & Radiologic Studies    CBC Recent Labs    03/24/20 1723 03/26/20 0418  WBC 8.5 9.9  NEUTROABS 6.4  --   HGB 12.4* 12.8*  HCT 42.6 41.8  MCV 84.7 80.5  PLT 167 494   Basic Metabolic Panel Recent Labs    03/26/20 0418 03/26/20 1447 03/27/20 0531  NA 134* 133* 135  K 4.0 3.2* 4.0  CL 91* 89* 87*  CO2 32 31 34*  GLUCOSE 107* 120* 115*  BUN 25* 23 27*  CREATININE 1.23 1.17 1.21  CALCIUM 9.6 9.5 9.6  MG 2.3  --  2.3  High Sensitivity Troponin:   Recent Labs  Lab 03/24/20 1727 03/24/20 2014  TROPONINIHS 7 7    _____________  ECHOCARDIOGRAM COMPLETE  Result Date: 03/25/2020    ECHOCARDIOGRAM REPORT   Patient Name:   Charles Gray Date of Exam: 03/25/2020 Medical Rec #:  [id number]       Height:       82.0 in Accession #:    [id number]      Weight:       370.0 lb Date of Birth:  Aug 18, 1955       BSA:          3.041 m Patient Age:    36 years         BP:           106/62 mmHg Patient Gender: M               HR:           71 bpm. Exam Location:  Inpatient Procedure: 2D Echo, Cardiac Doppler, Color Doppler and Intracardiac            Opacification Agent Indications:    I50.33 Acute on chronic diastolic (congestive) heart failure  History:        Patient has prior history of Echocardiogram examinations, most  recent 08/07/2019. Stroke, Arrythmias:Atrial Fibrillation,                 Signs/Symptoms:Dyspnea and Chest Pain; Risk                 Factors:Hypertension, Dyslipidemia and Sleep Apnea. Pulmonary                 Embolism.  Sonographer:    Jonelle Sidle Dance Referring Phys: [id number] [place]  1. Left ventricular ejection fraction, by estimation, is 60 to 65%. The left ventricle has normal function. The left ventricle has no regional wall motion abnormalities. Left ventricular diastolic parameters are consistent with Grade II diastolic dysfunction (pseudonormalization).  2. Right ventricular systolic function was not well visualized. The right ventricular size is not well visualized.  3. The mitral valve was not well visualized.  4. The aortic valve was not well visualized. Aortic valve regurgitation is not visualized. No aortic stenosis is present.  5. Aortic dilatation noted. There is mild dilatation of the aortic root, measuring 38 mm.  6. Very poor echo images. LV marginally visualized, EF appears normal. FINDINGS  Left Ventricle: Left ventricular ejection fraction, by estimation, is 60 to 65%. The left ventricle has normal function. The left ventricle has no regional wall motion abnormalities. Definity contrast agent was given IV to delineate the left ventricular  endocardial borders. The left ventricular internal cavity size was normal in size. There is no left ventricular hypertrophy. Left ventricular diastolic parameters are consistent with Grade II diastolic dysfunction (pseudonormalization). Right Ventricle: The right  ventricular size is not well visualized. Right vetricular wall thickness was not well visualized. Right ventricular systolic function was not well visualized. Left Atrium: Left atrial size was not assessed. Right Atrium: Right atrial size was not assessed. Pericardium: The pericardium was not well visualized. Mitral Valve: The mitral valve was not well visualized. No evidence of mitral valve regurgitation. Tricuspid Valve: The tricuspid valve is not well visualized. Tricuspid valve regurgitation is not demonstrated. Aortic Valve: The aortic valve was not well visualized. Aortic valve regurgitation is not visualized. No aortic stenosis is present. Pulmonic Valve: The pulmonic valve was not well visualized. Pulmonic valve regurgitation is not visualized. Aorta: Aortic dilatation noted. There is mild dilatation of the aortic root, measuring 38 mm. Venous: The inferior vena cava was not well visualized. IAS/Shunts: The interatrial septum was not assessed.  LEFT VENTRICLE PLAX 2D LVIDd:         4.60 cm  Diastology LVIDs:         4.50 cm  LV e' medial:    8.27 cm/s LV PW:         1.50 cm  LV E/e' medial:  6.5 LV IVS:        1.30 cm  LV e' lateral:   11.20 cm/s LVOT diam:     2.30 cm  LV E/e' lateral: 4.8 LV SV:         56 LV SV Index:   18 LVOT Area:     4.15 cm  IVC IVC diam: 1.70 cm LEFT ATRIUM           Index LA diam:      4.00 cm 1.32 cm/m LA Vol (A4C): 35.5 ml 11.67 ml/m  AORTIC VALVE LVOT Vmax:   71.80 cm/s LVOT Vmean:  47.000 cm/s LVOT VTI:    0.135 m  AORTA Ao Root diam: 3.80 cm MITRAL VALVE MV Area (PHT): 2.00 cm    SHUNTS MV  Decel Time: 380 msec    Systemic VTI:  0.14 m MV E velocity: 54.00 cm/s  Systemic Diam: 2.30 cm MV A velocity: 48.40 cm/s MV E/A ratio:  1.12 Loralie Champagne MD Electronically signed by Loralie Champagne MD Signature Date/Time: 03/25/2020/4:55:51 PM    Final    Disposition   Pt is being discharged home today in good condition.  Follow-up Plans & Appointments     Follow-up Information     [facility].   Specialty: Home Health Services Why: HHRN,HHPT Contact information: [address] [place] [address] [phone]        Larey Dresser, MD. Go on 04/11/2020.   Specialty: Cardiology Why: @ 2pm for hospital follow up  Contact information: [place] [address] [phone]              Discharge Instructions    Diet - low sodium heart healthy   Complete by: As directed    Increase activity slowly   Complete by: As directed       Discharge Medications   Allergies as of 03/27/2020      Reactions   Penicillins    Did it involve swelling of the face/tongue/throat, SOB, or low BP? N/A Did it involve sudden or severe rash/hives, skin peeling, or any reaction on the inside of your mouth or nose? N/A Did you need to seek medical attention at a hospital or doctor's office?N/A When did it last happen?N/A If all above answers are "NO", may proceed with cephalosporin use.      Medication List    STOP taking these medications   acetaminophen 325 MG tablet Commonly known as: TYLENOL   losartan 25 MG tablet Commonly known as: COZAAR   metolazone 2.5 MG tablet Commonly known as: ZAROXOLYN     TAKE these medications   apixaban 5 MG Tabs tablet Commonly known as: Eliquis Take 1 tablet (5 mg total) by mouth 2 (two) times daily.   bumetanide 1 MG tablet Commonly known as: BUMEX Take 3 tablets (3 mg total) by mouth 2 (two) times daily.   Buprenorphine HCl-Naloxone HCl 8-2 MG Film Place 1 tablet under the tongue in the morning and at bedtime.   eplerenone 50 MG tablet Commonly known as: INSPRA Take 2 tablets (100 mg total) by mouth daily.   gabapentin 800 MG tablet Commonly known as: NEURONTIN Take 1 tablet (800 mg total) by mouth 3 (three) times daily.   metoprolol tartrate 50 MG tablet Commonly known as: LOPRESSOR Take 1 tablet (50 mg total) by mouth 2 (two) times daily.   pantoprazole 40 MG  tablet Commonly known as: PROTONIX Take 40 mg by mouth 2 (two) times daily.   polyethylene glycol 17 g packet Commonly known as: MIRALAX / GLYCOLAX Take 17 g by mouth daily.   potassium chloride SA 20 MEQ tablet Commonly known as: KLOR-CON Take 40 mEq by mouth in the morning and at bedtime.   pravastatin 20 MG tablet Commonly known as: PRAVACHOL Take 1 tablet (20 mg total) by mouth daily.   Ventolin HFA 108 (90 Base) MCG/ACT inhaler Generic drug: albuterol Inhale 1 puff into the lungs every 4 (four) hours as needed for shortness of breath.   zolpidem 10 MG tablet Commonly known as: AMBIEN Take 10 mg by mouth at bedtime as needed for sleep.          Outstanding Labs/Studies   BMET at follow up  Duration of  Discharge Encounter   Greater than 30 minutes including physician time.  Jarrett Soho, PA 03/27/2020, 10:49 AM  Patient seen and examined.  Agree with above documentation.  Charles Gray is a 64 year old male with morbid obesity, chronic diastolic heart failure, paroxysmal atrial fibrillation, DVT/PE who presented with atrial fibrillation with RVR and acute on chronic diastolic heart failure.  He was given a dose of flecainide on admission and converted to sinus rhythm.  He diuresed with IV Lasix, converting to home Bumex 3 mg twice daily.  Was net negative nearly 6 L on admission.  Felt improved on discharge but began to report left-sided chest tenderness.  He was tender to palpation on his left breast.  Suspect this is secondary to spironolactone use, which he was started on on admission as eplerenone not on formulary (spironolactone was previously discontinued due to gynecomastia).  On exam:  GEN:  in no acute distress HEENT: normal Neck: no JVD Cardiac: RRR; no murmurs.  L sided TTP Respiratory:  clear to auscultation bilaterally, normal work of breathing GI: soft, nontender, nondistended, MS: no deformity or atrophy Skin: warm and dry, no  rash Neuro:  Alert and Oriented x 3, Strength and sensation are intact Psych: euthymic mood, full affect  Stable for discharge, follow-up with Dr [name] scheduled on 12/30.   Donato Heinz, MD

## 2020-03-27 NOTE — Plan of Care (Signed)

## 2020-03-27 NOTE — TOC Transition Note (Signed)
Transition of Care [facility]) - CM/SW Discharge Note   Patient Details  Name: Charles Gray MRN: [id number] Date of Birth: [birth year]  [facility] CM/SW Contact:  Zenon Mayo, RN Phone Number: 03/27/2020, 11:54 AM   Clinical Narrative:    NCM spoke with patient, he is active with [facility] [place], NCM confirmed with Helene Kelp.  Patient would like to continuie with them.  Will need resumption orders.  He also states that he has a Therapist, occupational and he uses [facility] transportation. NCM notified Helene Kelp with [facility] of patient 's dc today.   Final next level of care: [place] Barriers to Discharge: No Barriers Identified   Patient Goals and CMS Choice        Discharge Placement                       Discharge Plan and Services   Discharge Planning Services: CM Consult Post Acute Care Choice: Resumption of Svcs/PTA Provider                    HH Arranged: RN,PT [place] Agency: [facility] Date [place]: 03/27/20 Time [place]: 1152 Representative spoke with at [place]: [place] ([place]) Interventions     Readmission Risk Interventions Readmission Risk Prevention Plan 03/27/2020 02/02/2020 08/28/2019  Transportation Screening Complete Complete Complete  HRI or [place] for [place] Planning/Counseling - - -  Medication Review Press photographer) Complete Complete Complete  PCP or Specialist appointment within 3-5 days of discharge Complete - -  [place] or [place] Complete Complete -  SW Recovery Care/Counseling Consult Complete Complete Complete  Palliative Care Screening Not Applicable Not Applicable Not [employer] Not Applicable Not Applicable Not Applicable

## 2020-03-27 NOTE — Progress Notes (Signed)
Spoke to [employer] in regards to the patient's discharge transportation. [facility] transport services will meet the patient at the [address] tower after 2pm to transport the patient home.

## 2020-03-28 ENCOUNTER — Telehealth (HOSPITAL_COMMUNITY): Payer: Self-pay | Admitting: Licensed Clinical Social Worker

## 2020-03-28 NOTE — Telephone Encounter (Signed)
Pt called CSW to request extension of [employer] Meals program.  Pt states that those have been extremely helpful to him for the month of meals he received.  CSW sent extension request to [facility]   Patient identified as a candidate to receive 7 heart healthy meals per week for 4 weeks through [facility].  Completed referral sent in for review.  Anticipate patient will receive first shipment of food in 1-3 business days.  Jorge Ny, LCSW Clinical Social Worker [facility] Desk#: [phone] Cell#: [phone]

## 2020-04-02 ENCOUNTER — Other Ambulatory Visit (HOSPITAL_COMMUNITY): Payer: Self-pay

## 2020-04-02 ENCOUNTER — Telehealth (HOSPITAL_COMMUNITY): Payer: Self-pay

## 2020-04-02 DIAGNOSIS — I5032 Chronic diastolic (congestive) heart failure: Secondary | ICD-10-CM

## 2020-04-02 NOTE — Telephone Encounter (Signed)
Patient advised and verbalized understanding. Appt scheduled,lab orders entered

## 2020-04-02 NOTE — Progress Notes (Signed)
Paramedicine Encounter    Patient ID: Charles Gray, male    DOB: 1955-10-25, 64 y.o.   MRN: [id number]   Patient Care Team: Enid Skeens., MD as PCP - General (Family Medicine) Berniece Salines, DO as PCP - Cardiology (Cardiology)  Patient Active Problem List   Diagnosis Date Noted  . Atrial fibrillation with rapid ventricular response ([place]) 03/24/2020  . Acute on chronic right heart failure ([place]) 01/27/2020  . Acute respiratory failure with hypoxia ([place])   . Fatty liver disease, nonalcoholic   . Shock ([place])   . Personal history of DVT (deep vein thrombosis) 12/26/2019  . Chronic anticoagulation 12/26/2019  . Palpitations 12/26/2019  . Atrial fibrillation ([place]) 12/23/2019  . Heart failure, systolic, acute ([place]) 32/20/2542  . Acute on chronic congestive heart failure ([place])   . Acute on chronic diastolic CHF (congestive heart failure) ([place]) 08/23/2019  . Redness and swelling of lower leg 08/23/2019  . Chest pain 08/23/2019  . Lactic acidosis 08/23/2019  . History of pulmonary embolism 08/12/2019  . Atrial tachycardia ([place]) 08/12/2019  . Occasional tremors 08/12/2019  . OSA on CPAP 08/12/2019  . Acute on chronic right-sided heart failure ([place]) 08/06/2019  . Pulmonary embolism ([place]) 06/08/2019  . Normocytic anemia 06/08/2019  . Pulmonary emboli ([place]) 06/08/2019  . Chronic heart failure with preserved ejection fraction ([place]) 05/30/2019  . Class 2 severe obesity due to excess calories with serious comorbidity and body mass index (BMI) of 39.0 to 39.9 in adult ([place]) 05/30/2019  . Morbid obesity with BMI of 40.0-44.9, adult ([place]) 05/30/2019  . Hyponatremia   . Benign essential HTN   . Drug induced constipation   . Peripheral edema   . Hypotension due to drugs   . Hypoalbuminemia due to protein-calorie malnutrition ([place])   . Hypokalemia   . Thrombocytopenia ([place])   . Anemia of chronic disease   . Cirrhosis of liver without ascites ([place])   . Chronic pain syndrome   . Debility  03/13/2019  . Portal hypertensive gastropathy ([place]) 03/06/2019  . Dyspnea   . GIB (gastrointestinal bleeding) 03/04/2019  . Mixed hyperlipidemia 01/24/2019  . Insomnia 01/24/2019  . Hyperlipidemia 01/24/2019  . Hypertension 01/24/2019  . Lumbar disc herniation 01/24/2019    Current Outpatient Medications:  .  apixaban (ELIQUIS) 5 MG TABS tablet, Take 1 tablet (5 mg total) by mouth 2 (two) times daily., Disp: 60 tablet, Rfl: 0 .  bumetanide (BUMEX) 1 MG tablet, Take 3 tablets (3 mg total) by mouth 2 (two) times daily., Disp: 180 tablet, Rfl: 3 .  Buprenorphine HCl-Naloxone HCl 8-2 MG FILM, Place 1 tablet under the tongue in the morning and at bedtime. , Disp: , Rfl:  .  eplerenone (INSPRA) 50 MG tablet, Take 2 tablets (100 mg total) by mouth daily., Disp: 60 tablet, Rfl: 3 .  gabapentin (NEURONTIN) 800 MG tablet, Take 1 tablet (800 mg total) by mouth 3 (three) times daily., Disp: 90 tablet, Rfl: 1 .  metoprolol tartrate (LOPRESSOR) 50 MG tablet, Take 1 tablet (50 mg total) by mouth 2 (two) times daily., Disp: 180 tablet, Rfl: 1 .  pantoprazole (PROTONIX) 40 MG tablet, Take 40 mg by mouth 2 (two) times daily., Disp: , Rfl:  .  polyethylene glycol (MIRALAX / GLYCOLAX) 17 g packet, Take 17 g by mouth daily., Disp: 14 each, Rfl: 0 .  potassium chloride SA (KLOR-CON) 20 MEQ tablet, Take 40 mEq by mouth in the morning and at bedtime., Disp: , Rfl:  .  pravastatin (  PRAVACHOL) 20 MG tablet, Take 1 tablet (20 mg total) by mouth daily., Disp: 30 tablet, Rfl: 1 .  VENTOLIN HFA 108 (90 Base) MCG/ACT inhaler, Inhale 1 puff into the lungs every 4 (four) hours as needed for shortness of breath., Disp: 6.7 g, Rfl: 1 .  zolpidem (AMBIEN) 10 MG tablet, Take 10 mg by mouth at bedtime as needed for sleep. , Disp: , Rfl:  Allergies  Allergen Reactions  . Penicillins     Did it involve swelling of the face/tongue/throat, SOB, or low BP? N/A Did it involve sudden or severe rash/hives, skin peeling, or any  reaction on the inside of your mouth or nose? N/A Did you need to seek medical attention at a Gray or doctor's office?N/A When did it last happen?N/A If all above answers are "NO", may proceed with cephalosporin use.     Social History   Socioeconomic History  . Marital status: Single    Spouse name: Not on file  . Number of children: Not on file  . Years of education: Not on file  . Highest education level: Not on file  Occupational History  . Not on file  Tobacco Use  . Smoking status: Never Smoker  . Smokeless tobacco: Never Used  Vaping Use  . Vaping Use: Never used  Substance and Sexual Activity  . Alcohol use: Not Currently  . Drug use: Not Currently    Types: Marijuana    Comment: Smoked for 30 years  . Sexual activity: Not on file  Other Topics Concern  . Not on file  Social History Narrative  . Not on file   Social Determinants of Health   Financial Resource Strain: Low Risk   . Difficulty of Paying Living Expenses: Not very hard  Food Insecurity: No Food Insecurity  . Worried About Charity fundraiser in the Last Year: Never true  . Ran Out of Food in the Last Year: Never true  Transportation Needs: Unmet Transportation Needs  . Lack of Transportation (Medical): Yes  . Lack of Transportation (Non-Medical): Yes  Physical Activity: Not on file  Stress: Not on file  Social Connections: Not on file  Intimate Partner Violence: Not on file    Physical Exam Vitals reviewed.  Constitutional:      Appearance: He is normal weight.  HENT:     Head: Normocephalic.     Nose: Nose normal.     Mouth/Throat:     Mouth: Mucous membranes are moist.     Pharynx: Oropharynx is clear.  Eyes:     Pupils: Pupils are equal, round, and reactive to light.  Cardiovascular:     Rate and Rhythm: Normal rate and regular rhythm.     Pulses: Normal pulses.     Heart sounds: Normal heart sounds.  Pulmonary:     Effort: Respiratory distress present.     Breath sounds:  Normal breath sounds.     Comments: Short of breath upon walking.  Abdominal:     Palpations: Abdomen is soft.  Musculoskeletal:        General: Swelling present. Normal range of motion.     Cervical back: Normal range of motion.     Right lower leg: Edema present.     Left lower leg: Edema present.  Skin:    General: Skin is warm and dry.     Capillary Refill: Capillary refill takes less than 2 seconds.  Neurological:     General: No focal deficit present.  Mental Status: He is alert. Mental status is at baseline.  Psychiatric:        Mood and Affect: Mood normal.     Arrived for home visit for Charles Gray who was seated on his couch alert and oriented. Charles Gray reports having some shortness of breath upon walking, fatigue and swelling bilaterally. I assessed same and Charles Gray noted to have swelling in both lower legs with pitting edema. Charles Gray walked to bathroom and back and was short of breath with labored breathing. Lung sounds noted to be clear but labored. Vitals were obtained and are as noted. Taimur has gained up to 10lbs since his admission and discharge on 12/15. I called and spoke to [facility] about same where NP Janett Billow ordered Devonne to take 2.5 metolazone today with an extra 56mq of Potassium. Pill box set up accordingly. Vidal took metolazone during our visit. JJanett BillowNP [facility] REDS Clip reading. Hasheem agreed with same and will set up his own transportation for same. I plan to see Hiram in one week. Home visit completed.   Refills:  Bumex Potassium       Future Appointments  Date Time Provider [place] 04/11/2020  2:00 PM MLarey Dresser MD [facility] None  04/23/2020 10:30 AM [facility] PA/NP [facility] None  06/06/2020  2:20 PM Tobb, KGodfrey Pick DO [facility] None     ACTION: Home visit completed Next visit planned for one week

## 2020-04-02 NOTE — Telephone Encounter (Signed)
Heather,paramedicine called to report that patient has had about a 10lbs weight increase since being discharged from hospital. Vitals signs are normal and only has shortness of breath with exertion. Please advise.

## 2020-04-02 NOTE — Telephone Encounter (Signed)
He can take metolazone 2.5 mg once today with an extra dose of 40 mEq of KCl. Will need to come in for Bmet, BNP, ReDs & EKG Wednesday or Thursday. Thanks!

## 2020-04-03 NOTE — Addendum Note (Signed)
Addended by: Shela Nevin R on: 53/39/1792 09:56 AM   Modules accepted: Orders

## 2020-04-04 ENCOUNTER — Other Ambulatory Visit: Payer: Self-pay

## 2020-04-04 ENCOUNTER — Ambulatory Visit (HOSPITAL_COMMUNITY)
Admission: RE | Admit: 2020-04-04 | Discharge: 2020-04-04 | Disposition: A | Discharge status: Home / Self Care | Payer: Medicare HMO | Source: Ambulatory Visit | Attending: Internal Medicine | Admitting: Internal Medicine

## 2020-04-04 VITALS — BP 118/76 | HR 77 | Wt 372.0 lb

## 2020-04-04 DIAGNOSIS — J9601 Acute respiratory failure with hypoxia: Secondary | ICD-10-CM | POA: Diagnosis not present

## 2020-04-04 DIAGNOSIS — I5032 Chronic diastolic (congestive) heart failure: Secondary | ICD-10-CM | POA: Diagnosis present

## 2020-04-04 LAB — BASIC METABOLIC PANEL
Anion gap: 13 (ref 5–15)
BUN: 16 mg/dL (ref 8–23)
CO2: 28 mmol/L (ref 22–32)
Calcium: 9.4 mg/dL (ref 8.9–10.3)
Chloride: 95 mmol/L — ABNORMAL LOW (ref 98–111)
Creatinine, Ser: 0.98 mg/dL (ref 0.61–1.24)
GFR, Estimated: >60 mL/min (ref >60)
Glucose, Bld: 108 mg/dL — ABNORMAL HIGH (ref 70–99)
Potassium: 3.8 mmol/L (ref 3.5–5.1)
Sodium: 136 mmol/L (ref 135–145)

## 2020-04-04 NOTE — Addendum Note (Signed)
Encounter addended by: Shonna Chock, CMA on: 71/21/9758 12:55 PM  Actions taken: Order list changed, Diagnosis association updated, Vitals modified, Chief Complaint modified

## 2020-04-08 ENCOUNTER — Telehealth (HOSPITAL_COMMUNITY): Payer: Self-pay | Admitting: *Deleted

## 2020-04-08 NOTE — Telephone Encounter (Signed)
Pt called c/o heart rate greater than 100 when walking, swollen feet, sweating with minimal movement, and feeling bad in general. Per Dr.McLean have pt take 2.101m of metolazone with his next dose of Bumex and keep office visit 12/30 at 2:00pm. Pt aware and agreeable with plan pt said he had one more 2.550mmetolazone left.

## 2020-04-11 ENCOUNTER — Encounter (HOSPITAL_COMMUNITY): Payer: Self-pay | Admitting: Cardiology

## 2020-04-11 ENCOUNTER — Other Ambulatory Visit: Payer: Self-pay

## 2020-04-11 ENCOUNTER — Encounter (HOSPITAL_COMMUNITY): Payer: Medicare HMO | Admitting: Cardiology

## 2020-04-11 ENCOUNTER — Ambulatory Visit (HOSPITAL_COMMUNITY)
Admission: RE | Admit: 2020-04-11 | Discharge: 2020-04-11 | Disposition: A | Discharge status: Home / Self Care | Payer: Medicare HMO | Source: Ambulatory Visit | Attending: Cardiology | Admitting: Cardiology

## 2020-04-11 ENCOUNTER — Other Ambulatory Visit (HOSPITAL_COMMUNITY): Payer: Self-pay

## 2020-04-11 VITALS — BP 104/60 | HR 65 | Wt 372.6 lb

## 2020-04-11 DIAGNOSIS — F419 Anxiety disorder, unspecified: Secondary | ICD-10-CM | POA: Diagnosis not present

## 2020-04-11 DIAGNOSIS — Z8249 Family history of ischemic heart disease and other diseases of the circulatory system: Secondary | ICD-10-CM | POA: Insufficient documentation

## 2020-04-11 DIAGNOSIS — G4733 Obstructive sleep apnea (adult) (pediatric): Secondary | ICD-10-CM | POA: Insufficient documentation

## 2020-04-11 DIAGNOSIS — I110 Hypertensive heart disease with heart failure: Secondary | ICD-10-CM | POA: Diagnosis not present

## 2020-04-11 DIAGNOSIS — J449 Chronic obstructive pulmonary disease, unspecified: Secondary | ICD-10-CM | POA: Diagnosis not present

## 2020-04-11 DIAGNOSIS — I2510 Atherosclerotic heart disease of native coronary artery without angina pectoris: Secondary | ICD-10-CM | POA: Insufficient documentation

## 2020-04-11 DIAGNOSIS — E785 Hyperlipidemia, unspecified: Secondary | ICD-10-CM | POA: Diagnosis not present

## 2020-04-11 DIAGNOSIS — I480 Paroxysmal atrial fibrillation: Secondary | ICD-10-CM | POA: Insufficient documentation

## 2020-04-11 DIAGNOSIS — Z7901 Long term (current) use of anticoagulants: Secondary | ICD-10-CM | POA: Diagnosis not present

## 2020-04-11 DIAGNOSIS — I5032 Chronic diastolic (congestive) heart failure: Secondary | ICD-10-CM | POA: Diagnosis present

## 2020-04-11 DIAGNOSIS — Z79899 Other long term (current) drug therapy: Secondary | ICD-10-CM | POA: Insufficient documentation

## 2020-04-11 DIAGNOSIS — Z86718 Personal history of other venous thrombosis and embolism: Secondary | ICD-10-CM | POA: Insufficient documentation

## 2020-04-11 DIAGNOSIS — Z86711 Personal history of pulmonary embolism: Secondary | ICD-10-CM | POA: Diagnosis not present

## 2020-04-11 DIAGNOSIS — K7581 Nonalcoholic steatohepatitis (NASH): Secondary | ICD-10-CM | POA: Insufficient documentation

## 2020-04-11 LAB — BRAIN NATRIURETIC PEPTIDE: B Natriuretic Peptide: 47.9 pg/mL (ref 0.0–100.0)

## 2020-04-11 LAB — BASIC METABOLIC PANEL
Anion gap: 12 (ref 5–15)
BUN: 21 mg/dL (ref 8–23)
CO2: 29 mmol/L (ref 22–32)
Calcium: 9.3 mg/dL (ref 8.9–10.3)
Chloride: 91 mmol/L — ABNORMAL LOW (ref 98–111)
Creatinine, Ser: 1.08 mg/dL (ref 0.61–1.24)
GFR, Estimated: >60 mL/min (ref >60)
Glucose, Bld: 114 mg/dL — ABNORMAL HIGH (ref 70–99)
Potassium: 4.3 mmol/L (ref 3.5–5.1)
Sodium: 132 mmol/L — ABNORMAL LOW (ref 135–145)

## 2020-04-11 LAB — MAGNESIUM: Magnesium: 2.5 mg/dL — ABNORMAL HIGH (ref 1.7–2.4)

## 2020-04-11 MED ORDER — EMPAGLIFLOZIN 10 MG PO TABS: 10.0000 mg | ORAL_TABLET | Freq: Every day | ORAL | 3 refills | Status: DC

## 2020-04-11 MED ORDER — METOLAZONE 2.5 MG PO TABS: 2.5000 mg | ORAL_TABLET | ORAL | 3 refills | Status: DC

## 2020-04-11 MED ORDER — METOPROLOL SUCCINATE ER 25 MG PO TB24: 25.0000 mg | ORAL_TABLET | Freq: Every day | ORAL | 3 refills | Status: DC

## 2020-04-11 NOTE — Progress Notes (Signed)
Paramedicine Encounter  Met Charles Gray in the clinic today with Dr. [name] where he reports he has had some increased shortness of breath and increased fatigue with elevated heart rate while walking or preforming tasks. Charles Gray reports he has not been using his sleep cpap machine regularly. I will schedule appointment for follow up with Dr. Radford Pax. Mehmet reports [facility] has not been out to see him lately. I will follow up with same. Dr. [name] made some med changes and recommended Charles Gray get the cardio mems placed. [name] was unsure about having this procedure done. I will continue to educate and assist patient with same. Charles Gray will have EKG and Labs drawn today. I will draw labs in the home next week at Dr. Claris Gladden request. Medication changes are as follows.  -Add Toprol XL 2m daily -Add Metolazone 2.566mevery Tues. And Fri.  -Add extra 40Meq Potassium on Tues. And Fri.  -Add Jardiance 108maily.   I will see Charles Gray in the home on Tuesday.   Refills: Gabapentin     Monday Jan. 3rd at 11:00 (virtual visit with Dr. TraFransico HimACTION: Home visit completed Next visit planned for tuesday

## 2020-04-11 NOTE — H&P (View-Only) (Signed)
ADVANCED HF CLINIC CONSULT NOTE  Primary Care: Enid Skeens., MD Primary Cardiologist: Dr. Berniece Salines, DO  HF Cardiology: Dr. [name]  HPI: 64 y.o. 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    He has had multiple hospitalizations in 2021 for CHF exacerbation, requiring IV Lasix and change in PO regimen from lasix to torsemide. Echo 4/21 showed normal LVEF 55-60% w/ G1DD. RV not well visualized, but felt to have component of RV failure. [place] 4/21 showed normal PA pressure and normal cardiac output. PFTs also completed and c/w mild obstructive airway disease.   In 7/21, he had a coronary CTA.  This showed no coronary calcium but there was a concern for soft plaque in the distal LCx and distal RCA with hemodynamically significant stenosis.  He was managed medically. He has not had any chest pain, but was started on ranolazine after his CTA.  He feels like it makes his "head fuzzy."    Admitted with A/C diastolic heart failure. Diuresed with IV lasix and transitioned to bumex 3 mg twice a day. Discharge weight 360 pounds. Discharged 02/09/2020.   He was admitted again in 12/21 with CHF.  Echo in 12/21 showed EF 81-85%, grade 2 diastolic dysfunction, RV poorly visualized.   Today he returns for followup of heart failure.  He is followed by paramedicine.  He continues to have significant exertional dyspnea, short of breath just walking around the house.  No chest pain. +Orthopnea, has to sleep on the sofa.  Sleeps poorly, cannot tolerate CPAP.  He is in NSR today, no palpitations. He has chronic low back pain which is also limiting.    Labs (6/21): K 4.3, creatinine 1.03 Labs (10/21): LDL 73 Labs (12/21): K 3.8, creatinine 0.98  ECG (personally reviewed): NSR, left axis deviation, poor RWP, inferior TWIs  PMH: 1. HTN 2. OSA 3. Hyperlipidemia 4. Low back pain 5. PE/DVT in 2/21.  - V/Q  scan in 6/21 was not suggestive of chronic PE.  6. Nonalcoholic steatohepatitis.  7. GI bleed in 10/20.  8. Anxiety 9. CAD: Coronary CTA in 7/21 with calcium score 0 but concern for soft plaque distal CFX and RCA.  By [facility], there was flow-limiting stenosis in distal CFX and distal RCA.  10. Chronic diastolic CHF: Echo in 6/31 with EF 55-60%, RV poorly visualized.  - RHC (4/21): mean RA 8, PA 20/8, mean PCWP 11, CI 2.8 - Echo (12/21):EF 49-70%, grade 2 diastolic dysfunction, RV poorly visualized.   11. PFTs (4/21): Mild obstruction.  12. ABIs (10/21): Normal  Review of Systems: All systems reviewed and negative except as per HPI.    Current Outpatient Medications  Medication Sig Dispense Refill  . apixaban (ELIQUIS) 5 MG TABS tablet Take 1 tablet (5 mg total) by mouth 2 (two) times daily. 60 tablet 0  . bumetanide (BUMEX) 1 MG tablet Take 3 tablets (3 mg total) by mouth 2 (two) times daily. 180 tablet 3  . Buprenorphine HCl-Naloxone HCl 8-2 MG FILM Place 1 tablet under the tongue in the morning and at bedtime.     . empagliflozin (JARDIANCE) 10 MG TABS tablet Take 1 tablet (10 mg total) by mouth daily before breakfast. 30 tablet 3  . eplerenone (INSPRA) 50 MG tablet Take 2 tablets (100 mg total) by mouth daily. 60 tablet 3  . gabapentin (NEURONTIN) 800 MG tablet Take 1 tablet (800 mg total) by mouth 3 (three)  times daily. 90 tablet 1  . metolazone (ZAROXOLYN) 2.5 MG tablet Take 1 tablet (2.5 mg total) by mouth 2 (two) times a week. Tuesdays and Fridays with extra KCL 31mq 90 tablet 3  . metoprolol succinate (TOPROL XL) 25 MG 24 hr tablet Take 1 tablet (25 mg total) by mouth daily. 30 tablet 3  . pantoprazole (PROTONIX) 40 MG tablet Take 40 mg by mouth 2 (two) times daily.    . polyethylene glycol (MIRALAX / GLYCOLAX) 17 g packet Take 17 g by mouth daily. 14 each 0  . potassium chloride SA (KLOR-CON) 20 MEQ tablet Take 40 mEq by mouth in the morning and at bedtime.    . pravastatin  (PRAVACHOL) 20 MG tablet Take 1 tablet (20 mg total) by mouth daily. 30 tablet 1  . VENTOLIN HFA 108 (90 Base) MCG/ACT inhaler Inhale 1 puff into the lungs every 4 (four) hours as needed for shortness of breath. 6.7 g 1  . zolpidem (AMBIEN) 10 MG tablet Take 10 mg by mouth at bedtime as needed for sleep.      No current facility-administered medications for this encounter.    Allergies  Allergen Reactions  . Penicillins     Did it involve swelling of the face/tongue/throat, SOB, or low BP? N/A Did it involve sudden or severe rash/hives, skin peeling, or any reaction on the inside of your mouth or nose? N/A Did you need to seek medical attention at a hospital or doctor's office?N/A When did it last happen?N/A If all above answers are "NO", may proceed with cephalosporin use.      Social History   Socioeconomic History  . Marital status: Single    Spouse name: Not on file  . Number of children: Not on file  . Years of education: Not on file  . Highest education level: Not on file  Occupational History  . Not on file  Tobacco Use  . Smoking status: Never Smoker  . Smokeless tobacco: Never Used  Vaping Use  . Vaping Use: Never used  Substance and Sexual Activity  . Alcohol use: Not Currently  . Drug use: Not Currently    Types: Marijuana    Comment: Smoked for 30 years  . Sexual activity: Not on file  Other Topics Concern  . Not on file  Social History Narrative  . Not on file   Social Determinants of Health   Financial Resource Strain: Low Risk   . Difficulty of Paying Living Expenses: Not very hard  Food Insecurity: No Food Insecurity  . Worried About RCharity fundraiserin the Last Year: Never true  . Ran Out of Food in the Last Year: Never true  Transportation Needs: Unmet Transportation Needs  . Lack of Transportation (Medical): Yes  . Lack of Transportation (Non-Medical): Yes  Physical Activity: Not on file  Stress: Not on file  Social Connections: Not on  file  Intimate Partner Violence: Not on file      Family History  Problem Relation Age of Onset  . Hyperlipidemia Mother   . Hypertension Mother   . Stroke Mother   . CAD Maternal Grandmother   . CAD Maternal Grandfather     Vitals:   04/11/20 0910  BP: 104/60  Pulse: 65  SpO2: 95%  Weight: (!) 169 kg (372 lb 9.6 oz)    PHYSICAL EXAM: General: NAD Neck: JVP 8-9 cm, no thyromegaly or thyroid nodule.  Lungs: Clear to auscultation bilaterally with normal respiratory effort. CV:  Nondisplaced PMI.  Heart regular S1/S2, no S3/S4, no murmur.  1+ edema 1/3 to knees bilaterally.  No carotid bruit.  Normal pedal pulses.  Abdomen: Soft, nontender, no hepatosplenomegaly, no distention.  Skin: Intact without lesions or rashes.  Neurologic: Alert and oriented x 3.  Psych: Normal affect. Extremities: No clubbing or cyanosis.  HEENT: Normal.   ASSESSMENT & PLAN: 1. Chronic diastolic CHF: Echo in 28/41 with EF 32-44%, grade 2 diastolic dysfunction, RV poorly visualized.  [place] in 4/21 actually showed optimized filling pressures and no pulmonary hypertension.  Patient has had multiple admissions this year with CHF.  I suspect that obesity and deconditioning play a large role in his symptomatology.  However, on exam today he does seem to have some volume overload.  Body habitus makes REDS clip unusable. NYHA class III symptoms. Unfortunately, his care has been fragmented among multiple providers leading to frustration.  - Ideally, would have Cardiomems placed to help guide treatment.  However, he is very nervous about placement.  We discussed it again today and he is going to think about it.  - Add Jardiance 10 mg daily for HFpEF.  BMET today and in 10 days.  - Continue Bumex 3 mg bid.  - Continue eplerenone 100 mg daily and KCl 40 bid.  - Increase metolazone to twice a week from once a week (Tuesday and Friday), take extra KCl 40 on metolazone days.   - It would be helpful if the CHF service  were notified to follow him if/when he is admitted again for CHF.   2. H/o PE/DVT: Suspect due to sedentary lifestyle.  Occurred in 2/21.  V/Q scan in 6/21 did not show evidence for chronic PE.   - Continue apixaban.  3. OSA: Cannot tolerate CPAP.  4. Anxiety/panic:  - Continue sertraline, 25 mg daily.  5. CAD: Coronary CTA showed suspected soft plaque distal RCA and distal LCX that is hemodynamically significant by FFR, but coronary calcium score surprisingly is 0. No chest pain.  - Continue statin, LDL 73 in 10/21.  6. Hypertension: BP running low.  He feels like metoprolol 50 mg bid makes him lightheaded.  - Stop metoprolol, start Toprol XL 25 mg daily.  7. Atrial fibrillation: Paroxysmal. NSR today.  - Continue apixaban.   Has followup with NP/PA in 1/22.   Loralie Champagne 04/11/2020

## 2020-04-11 NOTE — Patient Instructions (Addendum)
START Jardiance 90m (1 tablet) daily  STOP Metoprolol and START Toprol XL 269m(1 tablet) Daily  INCREASE Metolazone 2.2m79m1 tablet) on Tuesdays and Fridays   Take extra KCL 72m70m2 tablets) on those days  Labs done today, your results will be available in MyChart, we will contact you for abnormal readings.  Your physician recommends that you have repeat BMET drawn in 1 week  Keep Appointment with APP clinic  Your physician recommends that you schedule a follow-up appointment in: 6 weeks   If you have any questions or concerns before your next appointment please send us a[place]essage through [email or web address] our office at [phone] TO LEAVE A MESSAGE FOR THE NURSE SELECT OPTION 2, PLEASE LEAVE A MESSAGE INCLUDING: . YOUR NAME . DATE OF BIRTH . CALL BACK NUMBER . REASON FOR CALL**this is important as we prioritize the call backs  YOU WILL RECEIVE A CALL BACK THE SAME DAY AS LONG AS YOU CALL BEFORE 4:00 PM

## 2020-04-11 NOTE — Progress Notes (Signed)
ADVANCED HF CLINIC CONSULT NOTE  Primary Care: Enid Skeens., MD Primary Cardiologist: Dr. Berniece Salines, DO  HF Cardiology: Dr. [name]  HPI: 64 y.o. 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    He has had multiple hospitalizations in 2021 for CHF exacerbation, requiring IV Lasix and change in PO regimen from lasix to torsemide. Echo 4/21 showed normal LVEF 55-60% w/ G1DD. RV not well visualized, but felt to have component of RV failure. [place] 4/21 showed normal PA pressure and normal cardiac output. PFTs also completed and c/w mild obstructive airway disease.   In 7/21, he had a coronary CTA.  This showed no coronary calcium but there was a concern for soft plaque in the distal LCx and distal RCA with hemodynamically significant stenosis.  He was managed medically. He has not had any chest pain, but was started on ranolazine after his CTA.  He feels like it makes his "head fuzzy."    Admitted with A/C diastolic heart failure. Diuresed with IV lasix and transitioned to bumex 3 mg twice a day. Discharge weight 360 pounds. Discharged 02/09/2020.   He was admitted again in 12/21 with CHF.  Echo in 12/21 showed EF 91-47%, grade 2 diastolic dysfunction, RV poorly visualized.   Today he returns for followup of heart failure.  He is followed by paramedicine.  He continues to have significant exertional dyspnea, short of breath just walking around the house.  No chest pain. +Orthopnea, has to sleep on the sofa.  Sleeps poorly, cannot tolerate CPAP.  He is in NSR today, no palpitations. He has chronic low back pain which is also limiting.    Labs (6/21): K 4.3, creatinine 1.03 Labs (10/21): LDL 73 Labs (12/21): K 3.8, creatinine 0.98  ECG (personally reviewed): NSR, left axis deviation, poor RWP, inferior TWIs  PMH: 1. HTN 2. OSA 3. Hyperlipidemia 4. Low back pain 5. PE/DVT in 2/21.  - V/Q  scan in 6/21 was not suggestive of chronic PE.  6. Nonalcoholic steatohepatitis.  7. GI bleed in 10/20.  8. Anxiety 9. CAD: Coronary CTA in 7/21 with calcium score 0 but concern for soft plaque distal CFX and RCA.  By [facility], there was flow-limiting stenosis in distal CFX and distal RCA.  10. Chronic diastolic CHF: Echo in 8/29 with EF 55-60%, RV poorly visualized.  - RHC (4/21): mean RA 8, PA 20/8, mean PCWP 11, CI 2.8 - Echo (12/21):EF 56-21%, grade 2 diastolic dysfunction, RV poorly visualized.   11. PFTs (4/21): Mild obstruction.  12. ABIs (10/21): Normal  Review of Systems: All systems reviewed and negative except as per HPI.    Current Outpatient Medications  Medication Sig Dispense Refill  . apixaban (ELIQUIS) 5 MG TABS tablet Take 1 tablet (5 mg total) by mouth 2 (two) times daily. 60 tablet 0  . bumetanide (BUMEX) 1 MG tablet Take 3 tablets (3 mg total) by mouth 2 (two) times daily. 180 tablet 3  . Buprenorphine HCl-Naloxone HCl 8-2 MG FILM Place 1 tablet under the tongue in the morning and at bedtime.     . empagliflozin (JARDIANCE) 10 MG TABS tablet Take 1 tablet (10 mg total) by mouth daily before breakfast. 30 tablet 3  . eplerenone (INSPRA) 50 MG tablet Take 2 tablets (100 mg total) by mouth daily. 60 tablet 3  . gabapentin (NEURONTIN) 800 MG tablet Take 1 tablet (800 mg total) by mouth 3 (three)  times daily. 90 tablet 1  . metolazone (ZAROXOLYN) 2.5 MG tablet Take 1 tablet (2.5 mg total) by mouth 2 (two) times a week. Tuesdays and Fridays with extra KCL 85mq 90 tablet 3  . metoprolol succinate (TOPROL XL) 25 MG 24 hr tablet Take 1 tablet (25 mg total) by mouth daily. 30 tablet 3  . pantoprazole (PROTONIX) 40 MG tablet Take 40 mg by mouth 2 (two) times daily.    . polyethylene glycol (MIRALAX / GLYCOLAX) 17 g packet Take 17 g by mouth daily. 14 each 0  . potassium chloride SA (KLOR-CON) 20 MEQ tablet Take 40 mEq by mouth in the morning and at bedtime.    . pravastatin  (PRAVACHOL) 20 MG tablet Take 1 tablet (20 mg total) by mouth daily. 30 tablet 1  . VENTOLIN HFA 108 (90 Base) MCG/ACT inhaler Inhale 1 puff into the lungs every 4 (four) hours as needed for shortness of breath. 6.7 g 1  . zolpidem (AMBIEN) 10 MG tablet Take 10 mg by mouth at bedtime as needed for sleep.      No current facility-administered medications for this encounter.    Allergies  Allergen Reactions  . Penicillins     Did it involve swelling of the face/tongue/throat, SOB, or low BP? N/A Did it involve sudden or severe rash/hives, skin peeling, or any reaction on the inside of your mouth or nose? N/A Did you need to seek medical attention at a hospital or doctor's office?N/A When did it last happen?N/A If all above answers are "NO", may proceed with cephalosporin use.      Social History   Socioeconomic History  . Marital status: Single    Spouse name: Not on file  . Number of children: Not on file  . Years of education: Not on file  . Highest education level: Not on file  Occupational History  . Not on file  Tobacco Use  . Smoking status: Never Smoker  . Smokeless tobacco: Never Used  Vaping Use  . Vaping Use: Never used  Substance and Sexual Activity  . Alcohol use: Not Currently  . Drug use: Not Currently    Types: Marijuana    Comment: Smoked for 30 years  . Sexual activity: Not on file  Other Topics Concern  . Not on file  Social History Narrative  . Not on file   Social Determinants of Health   Financial Resource Strain: Low Risk   . Difficulty of Paying Living Expenses: Not very hard  Food Insecurity: No Food Insecurity  . Worried About RCharity fundraiserin the Last Year: Never true  . Ran Out of Food in the Last Year: Never true  Transportation Needs: Unmet Transportation Needs  . Lack of Transportation (Medical): Yes  . Lack of Transportation (Non-Medical): Yes  Physical Activity: Not on file  Stress: Not on file  Social Connections: Not on  file  Intimate Partner Violence: Not on file      Family History  Problem Relation Age of Onset  . Hyperlipidemia Mother   . Hypertension Mother   . Stroke Mother   . CAD Maternal Grandmother   . CAD Maternal Grandfather     Vitals:   04/11/20 0910  BP: 104/60  Pulse: 65  SpO2: 95%  Weight: (!) 169 kg (372 lb 9.6 oz)    PHYSICAL EXAM: General: NAD Neck: JVP 8-9 cm, no thyromegaly or thyroid nodule.  Lungs: Clear to auscultation bilaterally with normal respiratory effort. CV:  Nondisplaced PMI.  Heart regular S1/S2, no S3/S4, no murmur.  1+ edema 1/3 to knees bilaterally.  No carotid bruit.  Normal pedal pulses.  Abdomen: Soft, nontender, no hepatosplenomegaly, no distention.  Skin: Intact without lesions or rashes.  Neurologic: Alert and oriented x 3.  Psych: Normal affect. Extremities: No clubbing or cyanosis.  HEENT: Normal.   ASSESSMENT & PLAN: 1. Chronic diastolic CHF: Echo in 81/10 with EF 31-59%, grade 2 diastolic dysfunction, RV poorly visualized.  Stem in 4/21 actually showed optimized filling pressures and no pulmonary hypertension.  Patient has had multiple admissions this year with CHF.  I suspect that obesity and deconditioning play a large role in his symptomatology.  However, on exam today he does seem to have some volume overload.  Body habitus makes REDS clip unusable. NYHA class III symptoms. Unfortunately, his care has been fragmented among multiple providers leading to frustration.  - Ideally, would have Cardiomems placed to help guide treatment.  However, he is very nervous about placement.  We discussed it again today and he is going to think about it.  - Add Jardiance 10 mg daily for HFpEF.  BMET today and in 10 days.  - Continue Bumex 3 mg bid.  - Continue eplerenone 100 mg daily and KCl 40 bid.  - Increase metolazone to twice a week from once a week (Tuesday and Friday), take extra KCl 40 on metolazone days.   - It would be helpful if the CHF service  were notified to follow him if/when he is admitted again for CHF.   2. H/o PE/DVT: Suspect due to sedentary lifestyle.  Occurred in 2/21.  V/Q scan in 6/21 did not show evidence for chronic PE.   - Continue apixaban.  3. OSA: Cannot tolerate CPAP.  4. Anxiety/panic:  - Continue sertraline, 25 mg daily.  5. CAD: Coronary CTA showed suspected soft plaque distal RCA and distal LCX that is hemodynamically significant by FFR, but coronary calcium score surprisingly is 0. No chest pain.  - Continue statin, LDL 73 in 10/21.  6. Hypertension: BP running low.  He feels like metoprolol 50 mg bid makes him lightheaded.  - Stop metoprolol, start Toprol XL 25 mg daily.  7. Atrial fibrillation: Paroxysmal. NSR today.  - Continue apixaban.   Has followup with NP/PA in 1/22.   Loralie Champagne 04/11/2020

## 2020-04-15 ENCOUNTER — Other Ambulatory Visit: Payer: Self-pay

## 2020-04-15 ENCOUNTER — Telehealth (HOSPITAL_COMMUNITY): Payer: Self-pay | Admitting: *Deleted

## 2020-04-15 ENCOUNTER — Telehealth: Payer: Medicare HMO | Admitting: Cardiology

## 2020-04-15 NOTE — Telephone Encounter (Signed)
Pt left VM stating his weight is up 4-5lbs in 1 day and his foot is really swollen and red at the ankle. Pt said it is hot to the touch and is hurting. Pt said he did not twist his ankle and is afraid he has a blood clot. Pt said he would call EMS if he doesn't hear back from our office. I called pt back and he did not answer/ left msg requesting return call.

## 2020-04-16 ENCOUNTER — Emergency Department (HOSPITAL_COMMUNITY): Admit: 2020-04-16 | Payer: Medicare HMO

## 2020-04-16 ENCOUNTER — Other Ambulatory Visit: Payer: Self-pay

## 2020-04-16 ENCOUNTER — Telehealth (HOSPITAL_COMMUNITY): Payer: Self-pay | Admitting: Cardiology

## 2020-04-16 ENCOUNTER — Other Ambulatory Visit (HOSPITAL_COMMUNITY): Payer: Self-pay

## 2020-04-16 ENCOUNTER — Encounter (HOSPITAL_COMMUNITY): Payer: Self-pay

## 2020-04-16 ENCOUNTER — Ambulatory Visit (HOSPITAL_BASED_OUTPATIENT_CLINIC_OR_DEPARTMENT_OTHER)
Admit: 2020-04-16 | Discharge: 2020-04-16 | Disposition: A | Discharge status: Home / Self Care | Payer: Medicare HMO | Attending: Cardiology | Admitting: Cardiology

## 2020-04-16 ENCOUNTER — Telehealth (HOSPITAL_COMMUNITY): Payer: Self-pay

## 2020-04-16 ENCOUNTER — Emergency Department (HOSPITAL_COMMUNITY)
Admission: EM | Admit: 2020-04-16 | Discharge: 2020-04-16 | Disposition: A | Discharge status: Home / Self Care | Payer: Medicare HMO | Attending: Emergency Medicine | Admitting: Emergency Medicine

## 2020-04-16 ENCOUNTER — Ambulatory Visit (HOSPITAL_COMMUNITY)
Admission: RE | Admit: 2020-04-16 | Discharge: 2020-04-16 | Disposition: A | Discharge status: Home / Self Care | Payer: Medicare HMO | Source: Ambulatory Visit | Attending: Cardiology | Admitting: Cardiology

## 2020-04-16 DIAGNOSIS — I5032 Chronic diastolic (congestive) heart failure: Secondary | ICD-10-CM | POA: Insufficient documentation

## 2020-04-16 DIAGNOSIS — Z5321 Procedure and treatment not carried out due to patient leaving prior to being seen by health care provider: Secondary | ICD-10-CM | POA: Diagnosis not present

## 2020-04-16 DIAGNOSIS — R600 Localized edema: Secondary | ICD-10-CM | POA: Insufficient documentation

## 2020-04-16 DIAGNOSIS — M79604 Pain in right leg: Secondary | ICD-10-CM

## 2020-04-16 LAB — TROPONIN I (HIGH SENSITIVITY)
Troponin I (High Sensitivity): 5 ng/L (ref <18)
Troponin I (High Sensitivity): 5 ng/L (ref <18)

## 2020-04-16 LAB — CBC
HCT: 39.2 % (ref 39.0–52.0)
Hemoglobin: 11.7 g/dL — ABNORMAL LOW (ref 13.0–17.0)
MCH: 24.8 pg — ABNORMAL LOW (ref 26.0–34.0)
MCHC: 29.8 g/dL — ABNORMAL LOW (ref 30.0–36.0)
MCV: 83.2 fL (ref 80.0–100.0)
Platelets: 114 10*3/uL — ABNORMAL LOW (ref 150–400)
RBC: 4.71 MIL/uL (ref 4.22–5.81)
RDW: 15.8 % — ABNORMAL HIGH (ref 11.5–15.5)
WBC: 8.2 10*3/uL (ref 4.0–10.5)
nRBC: 0 % (ref 0.0–0.2)

## 2020-04-16 LAB — BASIC METABOLIC PANEL
Anion gap: 11 (ref 5–15)
Anion gap: 11 (ref 5–15)
BUN: 16 mg/dL (ref 8–23)
BUN: 16 mg/dL (ref 8–23)
CO2: 29 mmol/L (ref 22–32)
CO2: 28 mmol/L (ref 22–32)
Calcium: 9 mg/dL (ref 8.9–10.3)
Calcium: 9 mg/dL (ref 8.9–10.3)
Chloride: 95 mmol/L — ABNORMAL LOW (ref 98–111)
Chloride: 95 mmol/L — ABNORMAL LOW (ref 98–111)
Creatinine, Ser: 1.08 mg/dL (ref 0.61–1.24)
Creatinine, Ser: 1 mg/dL (ref 0.61–1.24)
GFR, Estimated: >60 mL/min (ref >60)
GFR, Estimated: >60 mL/min (ref >60)
Glucose, Bld: 123 mg/dL — ABNORMAL HIGH (ref 70–99)
Glucose, Bld: 136 mg/dL — ABNORMAL HIGH (ref 70–99)
Potassium: 4.2 mmol/L (ref 3.5–5.1)
Potassium: 4.3 mmol/L (ref 3.5–5.1)
Sodium: 135 mmol/L (ref 135–145)
Sodium: 134 mmol/L — ABNORMAL LOW (ref 135–145)

## 2020-04-16 LAB — BRAIN NATRIURETIC PEPTIDE: B Natriuretic Peptide: 38.9 pg/mL (ref 0.0–100.0)

## 2020-04-16 MED ORDER — DOXYCYCLINE HYCLATE 50 MG PO CAPS: 100.0000 mg | ORAL_CAPSULE | Freq: Two times a day (BID) | ORAL | 0 refills | Status: DC

## 2020-04-16 MED FILL — DOXYCYCLINE HYC 50 MG CAP: 50 | 14 days supply | Qty: 56 | Fill #0

## 2020-04-16 NOTE — ED Triage Notes (Signed)
Pt BIB [facility], sent here by Dr. [name] for his Bilateral pitting edema. Pt called 911 for CP but denied CP to EMS. 12 lead unremarkable.   BP 122/70 HR 88 97% RA  CBG 146

## 2020-04-16 NOTE — Telephone Encounter (Signed)
Mr. Charles Gray called me reporting swelling in his right lower leg with pain, redness and hot to touch. I reported same to advanced heart failure clinic who agreed patient should be further evaluated. Patient called EMS for transport to ED. I will follow up with patient once he returns home. Call complete.

## 2020-04-16 NOTE — ED Triage Notes (Signed)
[facility] pt called in for BLE edema, they gave him an appointment to be seen at [facility]&V, he said he did not have any transportation they advised him to call an ambulance service to his scheduled dr's appt. Pt then called 911 for CP. [facility]&V is aware he is here and has asked we dc him and send him over to hem for his appt. They moved his appt up since he was already here in the waiting room.

## 2020-04-16 NOTE — ED Triage Notes (Signed)
Pt now endorses CP, states "I always have some CP" pt also states he was sent here to rule out bilateral blood clots

## 2020-04-16 NOTE — Telephone Encounter (Signed)
Pt c/o R LE redness, pain and swelling +blisters  Per Dr [name] Doxy 100  Mg BID x 14 days LE vascular to r/o dvt   Orders placed and pt aware via Nurse Navigator Aileen Pilot, [place])

## 2020-04-16 NOTE — Progress Notes (Signed)
Lower extremity venous RT study completed.   Please see CV Proc for preliminary results.   Rahaf Carbonell, RDMS  

## 2020-04-17 ENCOUNTER — Telehealth (HOSPITAL_COMMUNITY): Payer: Self-pay

## 2020-04-17 NOTE — Telephone Encounter (Signed)
Patient aware.

## 2020-04-17 NOTE — Telephone Encounter (Signed)
-----   Message from Larey Dresser, MD sent at 04/16/2020  5:43 PM EST ----- No DVT

## 2020-04-18 ENCOUNTER — Telehealth (HOSPITAL_COMMUNITY): Payer: Self-pay

## 2020-04-18 NOTE — Telephone Encounter (Signed)
Charles Gray text HF navigator regarding continued pain, swelling and reddness in RLE. Educated to elevate extremity. Pt stated he was taking doxy 1 tab in AM, 1 tab at night. Educated patient on dosage prescription: take 2 tablets (60m each) in the AM and 2 tablets (533meach) PM for proper prescribed dose of 10035mID. Pt restated proper medication administration.

## 2020-04-19 ENCOUNTER — Ambulatory Visit: Payer: Medicare HMO | Admitting: Cardiology

## 2020-04-20 ENCOUNTER — Telehealth: Payer: Self-pay | Admitting: Cardiology

## 2020-04-20 NOTE — Telephone Encounter (Signed)
Pt with nausea, retching and some diarrhea, this is since started on doxycyline for cellulitis.  I have asked him to stop antibiotics for now and will ask Dr. [name] what he would prefer instead.  Pt understands if Nausea and diarrhea not improved to cal 911 for ER visit.

## 2020-04-21 ENCOUNTER — Telehealth: Payer: Self-pay | Admitting: Cardiology

## 2020-04-21 ENCOUNTER — Other Ambulatory Visit: Payer: Self-pay | Admitting: Cardiology

## 2020-04-21 MED ORDER — CEPHALEXIN 500 MG PO CAPS
500.0000 mg | ORAL_CAPSULE | Freq: Four times a day (QID) | ORAL | 0 refills | Status: DC
Start: 2020-04-21 — End: 2020-05-01

## 2020-04-21 NOTE — Telephone Encounter (Signed)
Discussed antibiotics with Dr. [name], and then I discussed with HF pharmacist about next antibiotic to use for cellulitis.   Pt had Nausea and retching , diarrhea with doxycycline so stopped and when I called him today he was feeling better.  Pharmacist believes he should be fine on keflex  He has similar medication in past without side effects.  I have tried to arrange a pharmacy to deliver to him but none will deliver or the delivery is mail.  I have sent Kelflex in to his regular pharmacy that is closed today but he should be able to arrange to get tomorrow.    Will ask AHF team to check with pt tomorrow to see if he can pick up

## 2020-04-21 NOTE — Telephone Encounter (Signed)
Discussed with NP and pharmacy, will start him in cephalexin (think he can tolerate cephalosporins despite PCN allergy).

## 2020-04-22 ENCOUNTER — Telehealth (HOSPITAL_COMMUNITY): Payer: Self-pay | Admitting: Licensed Clinical Social Worker

## 2020-04-22 NOTE — Telephone Encounter (Signed)
CSW informed by [employer] that [employer] not able to pick up pt until 11am tomorrow so would arrive almost an hour late for his appt.  [facility] claims no times available to get pt here early either so had to reschedule pt appt as clinic schedule is too full.  Pt rescheduled for next week and CSW put in transportation request.  Paramedic to meet with pt tomorrow and will call provider to discuss any concerns- will also draw labs as pt was scheduled for lab draw tomorrow during appt.  Will continue to follow and assist as needed  Jorge Ny, [place] [facility] Desk#: [phone] Cell#: [phone]

## 2020-04-23 ENCOUNTER — Encounter (HOSPITAL_COMMUNITY): Payer: Medicare HMO

## 2020-04-23 ENCOUNTER — Other Ambulatory Visit (HOSPITAL_COMMUNITY): Payer: Self-pay | Admitting: Cardiology

## 2020-04-23 ENCOUNTER — Other Ambulatory Visit (HOSPITAL_COMMUNITY): Payer: Self-pay

## 2020-04-23 DIAGNOSIS — I5032 Chronic diastolic (congestive) heart failure: Secondary | ICD-10-CM

## 2020-04-23 LAB — BASIC METABOLIC PANEL
Anion gap: 13 (ref 5–15)
BUN: 13 mg/dL (ref 8–23)
CO2: 26 mmol/L (ref 22–32)
Calcium: 8.9 mg/dL (ref 8.9–10.3)
Chloride: 97 mmol/L — ABNORMAL LOW (ref 98–111)
Creatinine, Ser: 0.89 mg/dL (ref 0.61–1.24)
GFR, Estimated: >60 mL/min (ref >60)
Glucose, Bld: 107 mg/dL — ABNORMAL HIGH (ref 70–99)
Potassium: 3.9 mmol/L (ref 3.5–5.1)
Sodium: 136 mmol/L (ref 135–145)

## 2020-04-23 NOTE — Progress Notes (Signed)
Paramedicine Encounter    Patient ID: Charles Gray, male    DOB: 03/11/1956, 65 y.o.   MRN: [id number]   Patient Care Team: Enid Skeens., MD as PCP - General (Family Medicine) Berniece Salines, DO as PCP - Cardiology (Cardiology)  Patient Active Problem List   Diagnosis Date Noted  . Atrial fibrillation with rapid ventricular response ([place]) 03/24/2020  . Acute on chronic right heart failure ([place]) 01/27/2020  . Acute respiratory failure with hypoxia ([place])   . Fatty liver disease, nonalcoholic   . Shock ([place])   . Personal history of DVT (deep vein thrombosis) 12/26/2019  . Chronic anticoagulation 12/26/2019  . Palpitations 12/26/2019  . Atrial fibrillation ([place]) 12/23/2019  . Heart failure, systolic, acute ([place]) 47/12/6281  . Acute on chronic congestive heart failure ([place])   . Acute on chronic diastolic CHF (congestive heart failure) ([place]) 08/23/2019  . Redness and swelling of lower leg 08/23/2019  . Chest pain 08/23/2019  . Lactic acidosis 08/23/2019  . History of pulmonary embolism 08/12/2019  . Atrial tachycardia ([place]) 08/12/2019  . Occasional tremors 08/12/2019  . OSA on CPAP 08/12/2019  . Acute on chronic right-sided heart failure ([place]) 08/06/2019  . Pulmonary embolism ([place]) 06/08/2019  . Normocytic anemia 06/08/2019  . Pulmonary emboli ([place]) 06/08/2019  . Chronic heart failure with preserved ejection fraction ([place]) 05/30/2019  . Class 2 severe obesity due to excess calories with serious comorbidity and body mass index (BMI) of 39.0 to 39.9 in adult ([place]) 05/30/2019  . Morbid obesity with BMI of 40.0-44.9, adult ([place]) 05/30/2019  . Hyponatremia   . Benign essential HTN   . Drug induced constipation   . Peripheral edema   . Hypotension due to drugs   . Hypoalbuminemia due to protein-calorie malnutrition ([place])   . Hypokalemia   . Thrombocytopenia ([place])   . Anemia of chronic disease   . Cirrhosis of liver without ascites ([place])   . Chronic pain syndrome   . Debility  03/13/2019  . Portal hypertensive gastropathy ([place]) 03/06/2019  . Dyspnea   . GIB (gastrointestinal bleeding) 03/04/2019  . Mixed hyperlipidemia 01/24/2019  . Insomnia 01/24/2019  . Hyperlipidemia 01/24/2019  . Hypertension 01/24/2019  . Lumbar disc herniation 01/24/2019    Current Outpatient Medications:  .  apixaban (ELIQUIS) 5 MG TABS tablet, Take 1 tablet (5 mg total) by mouth 2 (two) times daily., Disp: 60 tablet, Rfl: 0 .  bumetanide (BUMEX) 1 MG tablet, Take 3 tablets (3 mg total) by mouth 2 (two) times daily., Disp: 180 tablet, Rfl: 3 .  Buprenorphine HCl-Naloxone HCl 8-2 MG FILM, Place 1 tablet under the tongue in the morning and at bedtime. , Disp: , Rfl:  .  cephALEXin (KEFLEX) 500 MG capsule, Take 1 capsule (500 mg total) by mouth 4 (four) times daily for 10 days., Disp: 28 capsule, Rfl: 0 .  doxycycline (VIBRAMYCIN) 50 MG capsule, Take 2 capsules (100 mg total) by mouth 2 (two) times daily for 14 days., Disp: 56 capsule, Rfl: 0 .  empagliflozin (JARDIANCE) 10 MG TABS tablet, Take 1 tablet (10 mg total) by mouth daily before breakfast., Disp: 30 tablet, Rfl: 3 .  eplerenone (INSPRA) 50 MG tablet, Take 2 tablets (100 mg total) by mouth daily., Disp: 60 tablet, Rfl: 3 .  gabapentin (NEURONTIN) 800 MG tablet, Take 1 tablet (800 mg total) by mouth 3 (three) times daily., Disp: 90 tablet, Rfl: 1 .  metolazone (ZAROXOLYN) 2.5 MG tablet, Take 1 tablet (2.5 mg total) by mouth  2 (two) times a week. Tuesdays and Fridays with extra KCL 50mq, Disp: 90 tablet, Rfl: 3 .  metoprolol succinate (TOPROL XL) 25 MG 24 hr tablet, Take 1 tablet (25 mg total) by mouth daily., Disp: 30 tablet, Rfl: 3 .  pantoprazole (PROTONIX) 40 MG tablet, Take 40 mg by mouth 2 (two) times daily., Disp: , Rfl:  .  polyethylene glycol (MIRALAX / GLYCOLAX) 17 g packet, Take 17 g by mouth daily., Disp: 14 each, Rfl: 0 .  potassium chloride SA (KLOR-CON) 20 MEQ tablet, Take 40 mEq by mouth in the morning and at bedtime.,  Disp: , Rfl:  .  pravastatin (PRAVACHOL) 20 MG tablet, Take 1 tablet (20 mg total) by mouth daily., Disp: 30 tablet, Rfl: 1 .  VENTOLIN HFA 108 (90 Base) MCG/ACT inhaler, Inhale 1 puff into the lungs every 4 (four) hours as needed for shortness of breath., Disp: 6.7 g, Rfl: 1 .  zolpidem (AMBIEN) 10 MG tablet, Take 10 mg by mouth at bedtime as needed for sleep. , Disp: , Rfl:  Allergies  Allergen Reactions  . Penicillins     Did it involve swelling of the face/tongue/throat, SOB, or low BP? N/A Did it involve sudden or severe rash/hives, skin peeling, or any reaction on the inside of your mouth or nose? N/A Did you need to seek medical attention at a hospital or doctor's office?N/A When did it last happen?N/A If all above answers are "NO", may proceed with cephalosporin use.     Social History   Socioeconomic History  . Marital status: Single    Spouse name: Not on file  . Number of children: Not on file  . Years of education: Not on file  . Highest education level: Not on file  Occupational History  . Not on file  Tobacco Use  . Smoking status: Never Smoker  . Smokeless tobacco: Never Used  Vaping Use  . Vaping Use: Never used  Substance and Sexual Activity  . Alcohol use: Not Currently  . Drug use: Not Currently    Types: Marijuana    Comment: Smoked for 30 years  . Sexual activity: Not on file  Other Topics Concern  . Not on file  Social History Narrative  . Not on file   Social Determinants of Health   Financial Resource Strain: Low Risk   . Difficulty of Paying Living Expenses: Not very hard  Food Insecurity: No Food Insecurity  . Worried About RCharity fundraiserin the Last Year: Never true  . Ran Out of Food in the Last Year: Never true  Transportation Needs: Unmet Transportation Needs  . Lack of Transportation (Medical): Yes  . Lack of Transportation (Non-Medical): Yes  Physical Activity: Not on file  Stress: Not on file  Social Connections: Not on file   Intimate Partner Violence: Not on file    Physical Exam Vitals reviewed.  Constitutional:      Appearance: Normal appearance. He is normal weight.  HENT:     Nose: Nose normal.     Mouth/Throat:     Mouth: Mucous membranes are moist.     Pharynx: Oropharynx is clear.  Eyes:     Pupils: Pupils are equal, round, and reactive to light.  Cardiovascular:     Rate and Rhythm: Normal rate and regular rhythm.     Pulses: Normal pulses.     Heart sounds: Normal heart sounds.  Pulmonary:     Effort: Pulmonary effort is normal.  Breath sounds: Normal breath sounds.  Abdominal:     Palpations: Abdomen is soft.     Comments: SOFT STOOLS WITH ANTI-BIOTIC  Musculoskeletal:        General: Swelling present. Normal range of motion.     Cervical back: Normal range of motion.     Right lower leg: Edema present.     Left lower leg: Edema present.     Comments: RIGHT LEG SWOLLEN MORE SO THAN LEFT, RIGHT LEG IMPROVING FROM LAST WEEK  Skin:    General: Skin is warm and dry.     Capillary Refill: Capillary refill takes less than 2 seconds.  Neurological:     General: No focal deficit present.     Mental Status: He is alert. Mental status is at baseline.  Psychiatric:        Mood and Affect: Mood normal.     Arrived for home visit for Leodan who was alert and oriented seated on his couch in his living room in no apparent respiratory distress. Oluwadamilola reports he is feeling okay today but said last night he did not sleep well and wakes up often feeling sick to his stomach especially after taking his daily medications. Yonas also reports frequent overall body aches. Nakai has been compliant with medications. Over the weekend he was given RX for Keflex rather than the Doxycycline because he reported dark stools with nausea and vomiting. Vitals were obtained and are as noted. I assessed legs and they have improved greatly since last week. Right leg swelling present but less than it was. Coloration  improved and skin temperature was normal. Quang reports he has trouble remembering to take his antibiotics and I reminded him the importance of this and  to use an alarm if needed. He agreed and understood. [place] was reminded of upcoming appointments with transportation details TBD through [employer]. Soua agreed to meet in clinic next week rather than at home. Labs (BMET) drawn at 1108.   Arhaan reports being assessed on the phone by [facility] yesterday and is awaiting approval from his insurance to being [facility] services. I will follow up with same.   Home visit complete. I will see Koua in clinic next week.     Future Appointments  Date Time Provider [place]  05/01/2020 11:00 AM [facility] PA/NP [facility] None  06/14/2020  9:40 AM Larey Dresser, MD [facility] None     ACTION: Home visit completed Next visit planned for one week

## 2020-04-24 ENCOUNTER — Telehealth (HOSPITAL_COMMUNITY): Payer: Self-pay | Admitting: Licensed Clinical Social Worker

## 2020-04-24 ENCOUNTER — Telehealth (HOSPITAL_COMMUNITY): Payer: Self-pay | Admitting: *Deleted

## 2020-04-24 ENCOUNTER — Other Ambulatory Visit (HOSPITAL_COMMUNITY): Payer: Self-pay | Admitting: *Deleted

## 2020-04-24 MED ORDER — ONDANSETRON 4 MG PO TBDP: 4.0000 mg | ORAL_TABLET | Freq: Three times a day (TID) | ORAL | 0 refills | Status: DC | PRN

## 2020-04-24 NOTE — Telephone Encounter (Signed)
zofran sent to pharmacy.  Larey Dresser, MD  Harvie Junior, CMA; Rose Fillers, Marsh Dolly, RN Can write for Zofran for him.        Previous Messages   ----- Message -----  From: Harvie Junior, CMA  Sent: 04/24/2020 10:02 AM EST  To: Larey Dresser, MD, Jeris Penta, EMT  Subject: RE: patient inquiry                Forwarding to [place] for advice thanks.  ----- Message -----  From: Jeris Penta, EMT  Sent: 04/23/2020  5:29 PM EST  To: Harvie Junior, CMA  Subject: patient inquiry                  Hey! Charles Gray reports he gets very nauseated when taking his medications, he currently takes protonix daily but is asking for a nausea medication. Would a provider be able to approve and write this for him?   Thanks.

## 2020-04-24 NOTE — Telephone Encounter (Signed)
Community Paramedic informed CSW that pt has appt in [place] next week with a non [facility] provider and needs assistance with transportation to get there.  CSW able to set up ride through [employer] for pt PCP appt on 1/20- they will reach out to pt the day before appt to inform pt regarding ride details.  Will continue to follow and assist as needed  Jorge Ny, [facility] Desk#: [phone] Cell#: [phone]

## 2020-04-25 ENCOUNTER — Telehealth (HOSPITAL_COMMUNITY): Payer: Self-pay

## 2020-04-25 ENCOUNTER — Telehealth (HOSPITAL_COMMUNITY): Payer: Self-pay | Admitting: *Deleted

## 2020-04-25 NOTE — Telephone Encounter (Signed)
[name] texting me stating he lost 10lbs over night after taking metolazone yesterday and he is experiencing dizziness with a blood pressure of 130/88 and HR of 90. He states he feels like it is increasing when he moves around. I spoke to [facility] in clinic who advised me to tell him to hold metolazone tomorrow and increase his fluid intake to help with possible dehydration. I will follow up in the home Tuesday. [name] agreed with plan.

## 2020-04-25 NOTE — Telephone Encounter (Signed)
Pt contacted Lilia Pro (nurse navigator) c/o sweating, weakness, heart rate in the 90s. Per Dr.McLean pt needs to follow instructions given earlier. No other changes at this time. Pt also needs to consider cardiomems so that we can better monitor patients fluid. Dr.McLean to discuss cardiomems further with patient. Lilia Pro informed patient no changes at this time.

## 2020-04-30 ENCOUNTER — Telehealth (HOSPITAL_COMMUNITY): Payer: Self-pay

## 2020-04-30 NOTE — Progress Notes (Addendum)
ADVANCED HF CLINIC CONSULT NOTE  Primary Care: Enid Skeens., MD Primary Cardiologist: Dr. Berniece Salines, DO  HF Cardiology: Dr. [name]  HPI: 65 y.o. 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    He has had multiple hospitalizations in 2021 for CHF exacerbation, requiring IV Lasix and change in PO regimen from lasix to torsemide. Echo 4/21 showed normal LVEF 55-60% w/ G1DD. RV not well visualized, but felt to have component of RV failure. [place] 4/21 showed normal PA pressure and normal cardiac output. PFTs also completed and c/w mild obstructive airway disease.   In 7/21, he had a coronary CTA.  This showed no coronary calcium but there was a concern for soft plaque in the distal LCx and distal RCA with hemodynamically significant stenosis.  He was managed medically. He has not had any chest pain, but was started on ranolazine after his CTA.  He feels like it makes his "head fuzzy."    Admitted with A/C diastolic heart failure. Diuresed with IV lasix and transitioned to bumex 3 mg twice a day. Discharge weight 360 pounds. Discharged 02/09/2020.   He was admitted again in 12/21 with CHF.  Echo in 12/21 showed EF 70-62%, grade 2 diastolic dysfunction, RV poorly visualized.   OV on 12/21 still having significant exertional dyspnea, short of breath just walking around the house.  + Orthopnea, has to sleep on the sofa.  Sleeps poorly, cannot tolerate CPAP.  Cardiomems was discussed but patient reluctant, Jardiance added, metolazone increased to 2 x/week, metoprolol stopped, and Toprol XL 25 mg daily started.  Today he returns for HF follow up. Having stomach cramps since starting Jardiance. Weight up ~5 lbs. Overall feeling ok, breathing about the same. +orthopnea. Having palpitations intermittently, ~2-3x/week. Denies increasing SOB, CP, dizziness, edema, or PND/Orthopnea. Appetite ok. No fever or  chills. Weight at home ~269-275 pounds. Taking all medications. Unable to tolerate CPAP. Drinking >2L fluid/daily and trying to watch salt intake.   Labs (6/21): K 4.3, creatinine 1.03 Labs (10/21): LDL 73 Labs (12/21): K 3.8, creatinine 0.98  ECG (personally reviewed): SR 78 bpm   PMH: 1. HTN 2. OSA 3. Hyperlipidemia 4. Low back pain 5. PE/DVT in 2/21.  - V/Q scan in 6/21 was not suggestive of chronic PE.  6. Nonalcoholic steatohepatitis.  7. GI bleed in 10/20.  8. Anxiety 9. CAD: Coronary CTA in 7/21 with calcium score 0 but concern for soft plaque distal CFX and RCA.  By [facility], there was flow-limiting stenosis in distal CFX and distal RCA.  10. Chronic diastolic CHF: Echo in 3/76 with EF 55-60%, RV poorly visualized.  - RHC (4/21): mean RA 8, PA 20/8, mean PCWP 11, CI 2.8 - Echo (12/21):EF 28-31%, grade 2 diastolic dysfunction, RV poorly visualized.   11. PFTs (4/21): Mild obstruction.  12. ABIs (10/21): Normal.  Review of Systems: All systems reviewed and negative except as per HPI.    Current Outpatient Medications  Medication Sig Dispense Refill  . apixaban (ELIQUIS) 5 MG TABS tablet Take 1 tablet (5 mg total) by mouth 2 (two) times daily. 60 tablet 0  . bumetanide (BUMEX) 1 MG tablet Take 3 tablets (3 mg total) by mouth 2 (two) times daily. 180 tablet 3  . Buprenorphine HCl-Naloxone HCl 8-2 MG FILM Place 1 tablet under the tongue in the morning and at bedtime.     . empagliflozin (JARDIANCE) 10 MG TABS tablet Take  1 tablet (10 mg total) by mouth daily before breakfast. 30 tablet 3  . eplerenone (INSPRA) 50 MG tablet Take 2 tablets (100 mg total) by mouth daily. 60 tablet 3  . gabapentin (NEURONTIN) 800 MG tablet Take 1 tablet (800 mg total) by mouth 3 (three) times daily. 90 tablet 1  . metolazone (ZAROXOLYN) 2.5 MG tablet Take 1 tablet (2.5 mg total) by mouth 2 (two) times a week. Tuesdays and Fridays with extra KCL 85mq 90 tablet 3  . metoprolol succinate (TOPROL XL) 25  MG 24 hr tablet Take 1 tablet (25 mg total) by mouth daily. 30 tablet 3  . ondansetron (ZOFRAN ODT) 4 MG disintegrating tablet Take 1 tablet (4 mg total) by mouth every 8 (eight) hours as needed for nausea or vomiting. 20 tablet 0  . pantoprazole (PROTONIX) 40 MG tablet Take 40 mg by mouth 2 (two) times daily.    . polyethylene glycol (MIRALAX / GLYCOLAX) 17 g packet Take 17 g by mouth daily. 14 each 0  . potassium chloride SA (KLOR-CON) 20 MEQ tablet Take 40 mEq by mouth in the morning and at bedtime.    . pravastatin (PRAVACHOL) 20 MG tablet Take 1 tablet (20 mg total) by mouth daily. 30 tablet 1  . VENTOLIN HFA 108 (90 Base) MCG/ACT inhaler Inhale 1 puff into the lungs every 4 (four) hours as needed for shortness of breath. 6.7 g 1  . zolpidem (AMBIEN) 10 MG tablet Take 10 mg by mouth at bedtime as needed for sleep.      No current facility-administered medications for this encounter.   Allergies  Allergen Reactions  . Penicillins     Did it involve swelling of the face/tongue/throat, SOB, or low BP? N/A Did it involve sudden or severe rash/hives, skin peeling, or any reaction on the inside of your mouth or nose? N/A Did you need to seek medical attention at a hospital or doctor's office?N/A When did it last happen?N/A If all above answers are "NO", may proceed with cephalosporin use.   Social History   Socioeconomic History  . Marital status: Single    Spouse name: Not on file  . Number of children: Not on file  . Years of education: Not on file  . Highest education level: Not on file  Occupational History  . Not on file  Tobacco Use  . Smoking status: Never Smoker  . Smokeless tobacco: Never Used  Vaping Use  . Vaping Use: Never used  Substance and Sexual Activity  . Alcohol use: Not Currently  . Drug use: Not Currently    Types: Marijuana    Comment: Smoked for 30 years  . Sexual activity: Not on file  Other Topics Concern  . Not on file  Social History Narrative  .  Not on file   Social Determinants of Health   Financial Resource Strain: Low Risk   . Difficulty of Paying Living Expenses: Not very hard  Food Insecurity: No Food Insecurity  . Worried About RCharity fundraiserin the Last Year: Never true  . Ran Out of Food in the Last Year: Never true  Transportation Needs: Unmet Transportation Needs  . Lack of Transportation (Medical): Yes  . Lack of Transportation (Non-Medical): Yes  Physical Activity: Not on file  Stress: Not on file  Social Connections: Not on file  Intimate Partner Violence: Not on file    Family History  Problem Relation Age of Onset  . Hyperlipidemia Mother   .  Hypertension Mother   . Stroke Mother   . CAD Maternal Grandmother   . CAD Maternal Grandfather     Vitals:   05/01/20 1100  BP: 110/70  Pulse: 81  SpO2: 94%  Weight: (!) 170.9 kg (376 lb 12.8 oz)     Wt Readings from Last 3 Encounters:  05/01/20 (!) 170.9 kg (376 lb 12.8 oz)  04/23/20 (!) 168.3 kg (371 lb)  04/16/20 (!) 168.7 kg (372 lb)    PHYSICAL EXAM: General: NAD Neck: Thick neck, difficult to assess JVD, no thyromegaly or thyroid nodule.  Lungs: Clear to auscultation bilaterally with normal respiratory effort. CV: Nondisplaced PMI.  Heart regular S1/S2, no S3/S4, no murmur.  1-2+ bilateral pretibial edema.  No carotid bruit.  Normal pedal pulses.  Abdomen: Obese, soft, nontender, no hepatosplenomegaly, no distention.  Skin: Intact without lesions or rashes. Scaling to bilateral LE. RLE erythematous. Neurologic: Alert and oriented x 3.  Psych: Flat affect. Extremities: No clubbing or cyanosis.  HEENT: Normal.   ASSESSMENT & PLAN: 1. Chronic diastolic CHF: Echo in 42/35 with EF 36-14%, grade 2 diastolic dysfunction, RV poorly visualized.  [place] in 4/21 actually showed optimized filling pressures and no pulmonary hypertension.  Patient has had multiple admissions this year with CHF.  I suspect that obesity and deconditioning play a large role in  his symptomatology. Body habitus makes REDS clip unusable. Unfortunately, his care has been fragmented among multiple providers leading to frustration.  - NYHA class III symptoms. Volume stable today. - Ideally, would have Cardiomems placed to help guide treatment.  However, he is very nervous about placement.  Will defer further discussions to next visit. - Continue Jardiance 10 mg daily for HFpEF. Has taken it intermittently as it has caused GI upset. Advised taking with food. If cramping continues may need to discontinue. Patient has Zofran prn.    - Continue Bumex 3 mg bid. Encouraged compression stockings to help with LEE. - Continue eplerenone 100 mg daily and KCl 40 bid.  - Continue metolazone twice a week (Tuesday and Friday), take extra KCl 40 on metolazone days.   - It would be helpful if the CHF service were notified to follow him if/when he is admitted again for CHF.  - BMET today.  2. H/o PE/DVT: Suspect due to sedentary lifestyle.  Occurred in 2/21.  V/Q scan in 6/21 did not show evidence for chronic PE.   - Continue apixaban. No bleeding issues. 3. OSA: Cannot tolerate CPAP.  4. Anxiety/panic:  - Continue sertraline 25 mg daily.  5. CAD: Coronary CTA showed suspected soft plaque distal RCA and distal LCX that is hemodynamically significant by FFR, but coronary calcium score surprisingly is 0. No chest pain.  - Continue statin, LDL 73 in 10/21.  6. Hypertension: BP stable today. - Continue Toprol XL 25 mg daily (felt metoprolol 50 mg bid made him lightheaded). 7. Atrial fibrillation: Paroxysmal. SR on ECG today.  - Continue apixaban.  8. Right leg swelling: resolving, LE dopplers (1/22) negative for DVT. Completed Keflex. 9. Morbid Obesity: Body mass index is 39.4 kg/m. - Encouraged portion control  - Continue Paramedicine, appreciated their assistance. - Follow up in 4-6 weeks.  [place] 05/01/2020

## 2020-04-30 NOTE — Telephone Encounter (Signed)
Texted Charles Gray and confirmed his appointment for tomorrow and confirmed his transportation with [facility]. They will call him to set up a pick up time today no later than 1800. [place] agreed. Message complete.

## 2020-05-01 ENCOUNTER — Other Ambulatory Visit (HOSPITAL_COMMUNITY): Payer: Self-pay

## 2020-05-01 ENCOUNTER — Encounter (HOSPITAL_COMMUNITY): Payer: Self-pay

## 2020-05-01 ENCOUNTER — Ambulatory Visit (HOSPITAL_COMMUNITY)
Admission: RE | Admit: 2020-05-01 | Discharge: 2020-05-01 | Disposition: A | Discharge status: Home / Self Care | Payer: Medicare HMO | Source: Ambulatory Visit | Attending: Family Medicine | Admitting: Family Medicine

## 2020-05-01 ENCOUNTER — Other Ambulatory Visit: Payer: Self-pay

## 2020-05-01 VITALS — BP 110/70 | HR 81 | Wt 376.8 lb

## 2020-05-01 DIAGNOSIS — I480 Paroxysmal atrial fibrillation: Secondary | ICD-10-CM | POA: Diagnosis not present

## 2020-05-01 DIAGNOSIS — G4733 Obstructive sleep apnea (adult) (pediatric): Secondary | ICD-10-CM | POA: Diagnosis not present

## 2020-05-01 DIAGNOSIS — I5032 Chronic diastolic (congestive) heart failure: Secondary | ICD-10-CM | POA: Insufficient documentation

## 2020-05-01 DIAGNOSIS — Z86711 Personal history of pulmonary embolism: Secondary | ICD-10-CM | POA: Diagnosis not present

## 2020-05-01 DIAGNOSIS — Z79899 Other long term (current) drug therapy: Secondary | ICD-10-CM | POA: Insufficient documentation

## 2020-05-01 DIAGNOSIS — F419 Anxiety disorder, unspecified: Secondary | ICD-10-CM | POA: Insufficient documentation

## 2020-05-01 DIAGNOSIS — M7989 Other specified soft tissue disorders: Secondary | ICD-10-CM | POA: Insufficient documentation

## 2020-05-01 DIAGNOSIS — Z7901 Long term (current) use of anticoagulants: Secondary | ICD-10-CM | POA: Diagnosis not present

## 2020-05-01 DIAGNOSIS — Z86718 Personal history of other venous thrombosis and embolism: Secondary | ICD-10-CM | POA: Diagnosis not present

## 2020-05-01 DIAGNOSIS — I10 Essential (primary) hypertension: Secondary | ICD-10-CM

## 2020-05-01 DIAGNOSIS — I2510 Atherosclerotic heart disease of native coronary artery without angina pectoris: Secondary | ICD-10-CM | POA: Insufficient documentation

## 2020-05-01 DIAGNOSIS — Z8249 Family history of ischemic heart disease and other diseases of the circulatory system: Secondary | ICD-10-CM | POA: Diagnosis not present

## 2020-05-01 DIAGNOSIS — E785 Hyperlipidemia, unspecified: Secondary | ICD-10-CM | POA: Insufficient documentation

## 2020-05-01 DIAGNOSIS — Z6839 Body mass index (BMI) 39.0-39.9, adult: Secondary | ICD-10-CM | POA: Insufficient documentation

## 2020-05-01 DIAGNOSIS — I110 Hypertensive heart disease with heart failure: Secondary | ICD-10-CM | POA: Insufficient documentation

## 2020-05-01 DIAGNOSIS — K7581 Nonalcoholic steatohepatitis (NASH): Secondary | ICD-10-CM | POA: Insufficient documentation

## 2020-05-01 DIAGNOSIS — J449 Chronic obstructive pulmonary disease, unspecified: Secondary | ICD-10-CM | POA: Diagnosis not present

## 2020-05-01 LAB — BASIC METABOLIC PANEL
Anion gap: 11 (ref 5–15)
BUN: 14 mg/dL (ref 8–23)
CO2: 29 mmol/L (ref 22–32)
Calcium: 9.1 mg/dL (ref 8.9–10.3)
Chloride: 97 mmol/L — ABNORMAL LOW (ref 98–111)
Creatinine, Ser: 0.97 mg/dL (ref 0.61–1.24)
GFR, Estimated: >60 mL/min (ref >60)
Glucose, Bld: 116 mg/dL — ABNORMAL HIGH (ref 70–99)
Potassium: 4.1 mmol/L (ref 3.5–5.1)
Sodium: 137 mmol/L (ref 135–145)

## 2020-05-01 NOTE — Progress Notes (Signed)
Paramedicine Encounter    Patient ID: Charles Gray, male    DOB: 01/07/1956, 65 y.o.   MRN: [id number]  Arrived for clinic visit with Charles Gray who saw Charles Katz NP today. Charles Gray reports getting "sick" on his stomach after taking his medications. Charles Gray was instructed to eat when taking his medications and utilizing his Protonix in the morning and evening as well as taking the Zofan PRN. Charles Gray agreed with this plan. I reviewed medications and verified same with clinic. No med changes made, I filled pill box accordingly. Charles Gray also reports he was having some symptoms of vertigo, positional changing dizziness. Charles Gray and I recommended he discuss this with his PCP. He agreed and has a visit with them on Monday. Charles Gray also advised Charles Gray to try taking Jardiance with breakfast to see if this changes his upset stomach and if not she reports we could remove this from his list. Charles Gray agreed. Charles Gray's leg swelling has improved as well as the discoloration, Charles Gray advised him to keep legs elevated when not walking or doing daily tasks to help with swelling. She also advised him to be wearing his compression stockings.  I plan to see Charles Gray next week in the home. Charles Gray will be seen in the clinic in 4-6 weeks on March 3rd. Clinic visit complete.   Refills:  Metoprolol   ACTION: Home visit completed Next visit planned for one week on Tuesday.

## 2020-05-01 NOTE — Patient Instructions (Signed)
It was great to see you today! No medication changes are needed at this time.  Labs today We will only contact you if something comes back abnormal or we need to make some changes. Otherwise no news is good news!  Your physician recommends that you schedule a follow-up appointment in :6 weeks  in the [facility] ([facility]NP[facility]   If you have any questions or concerns before your next appointment please send [place] a message through [place] or call our office at [phone].    TO LEAVE A MESSAGE FOR THE NURSE SELECT OPTION 2, PLEASE LEAVE A MESSAGE INCLUDING: . YOUR NAME . DATE OF BIRTH . CALL BACK NUMBER . REASON FOR CALL**this is important as we prioritize the call backs  YOU WILL RECEIVE A CALL BACK THE SAME DAY AS LONG AS YOU CALL BEFORE 4:00 PM

## 2020-05-07 ENCOUNTER — Emergency Department (HOSPITAL_COMMUNITY): Payer: Medicare HMO

## 2020-05-07 ENCOUNTER — Emergency Department (HOSPITAL_COMMUNITY)
Admission: EM | Admit: 2020-05-07 | Discharge: 2020-05-07 | Disposition: A | Discharge status: Home / Self Care | Payer: Medicare HMO | Attending: Emergency Medicine | Admitting: Emergency Medicine

## 2020-05-07 ENCOUNTER — Encounter (HOSPITAL_COMMUNITY): Payer: Self-pay | Admitting: Emergency Medicine

## 2020-05-07 ENCOUNTER — Other Ambulatory Visit: Payer: Self-pay

## 2020-05-07 ENCOUNTER — Telehealth (HOSPITAL_COMMUNITY): Payer: Self-pay

## 2020-05-07 DIAGNOSIS — Z79899 Other long term (current) drug therapy: Secondary | ICD-10-CM | POA: Insufficient documentation

## 2020-05-07 DIAGNOSIS — I5033 Acute on chronic diastolic (congestive) heart failure: Secondary | ICD-10-CM | POA: Diagnosis not present

## 2020-05-07 DIAGNOSIS — J449 Chronic obstructive pulmonary disease, unspecified: Secondary | ICD-10-CM | POA: Insufficient documentation

## 2020-05-07 DIAGNOSIS — R079 Chest pain, unspecified: Secondary | ICD-10-CM | POA: Insufficient documentation

## 2020-05-07 DIAGNOSIS — Z7901 Long term (current) use of anticoagulants: Secondary | ICD-10-CM | POA: Diagnosis not present

## 2020-05-07 DIAGNOSIS — I110 Hypertensive heart disease with heart failure: Secondary | ICD-10-CM | POA: Diagnosis not present

## 2020-05-07 DIAGNOSIS — Z86718 Personal history of other venous thrombosis and embolism: Secondary | ICD-10-CM | POA: Insufficient documentation

## 2020-05-07 DIAGNOSIS — R0789 Other chest pain: Secondary | ICD-10-CM

## 2020-05-07 LAB — PROTIME-INR
INR: 1.4 — ABNORMAL HIGH (ref 0.8–1.2)
Prothrombin Time: 16.5 seconds — ABNORMAL HIGH (ref 11.4–15.2)

## 2020-05-07 LAB — BASIC METABOLIC PANEL
Anion gap: 13 (ref 5–15)
BUN: 21 mg/dL (ref 8–23)
CO2: 29 mmol/L (ref 22–32)
Calcium: 9.3 mg/dL (ref 8.9–10.3)
Chloride: 91 mmol/L — ABNORMAL LOW (ref 98–111)
Creatinine, Ser: 1.09 mg/dL (ref 0.61–1.24)
GFR, Estimated: >60 mL/min (ref >60)
Glucose, Bld: 108 mg/dL — ABNORMAL HIGH (ref 70–99)
Potassium: 4 mmol/L (ref 3.5–5.1)
Sodium: 133 mmol/L — ABNORMAL LOW (ref 135–145)

## 2020-05-07 LAB — TROPONIN I (HIGH SENSITIVITY)
Troponin I (High Sensitivity): 7 ng/L (ref <18)
Troponin I (High Sensitivity): 8 ng/L (ref <18)

## 2020-05-07 LAB — BRAIN NATRIURETIC PEPTIDE: B Natriuretic Peptide: 351.2 pg/mL — ABNORMAL HIGH (ref 0.0–100.0)

## 2020-05-07 LAB — CBC
HCT: 40.3 % (ref 39.0–52.0)
Hemoglobin: 13 g/dL (ref 13.0–17.0)
MCH: 25.6 pg — ABNORMAL LOW (ref 26.0–34.0)
MCHC: 32.3 g/dL (ref 30.0–36.0)
MCV: 79.3 fL — ABNORMAL LOW (ref 80.0–100.0)
Platelets: 156 10*3/uL (ref 150–400)
RBC: 5.08 MIL/uL (ref 4.22–5.81)
RDW: 15.9 % — ABNORMAL HIGH (ref 11.5–15.5)
WBC: 9.7 10*3/uL (ref 4.0–10.5)
nRBC: 0.2 % (ref 0.0–0.2)

## 2020-05-07 MED ORDER — METOPROLOL TARTRATE 25 MG PO TABS
25.0000 mg | ORAL_TABLET | Freq: Once | ORAL | Status: AC
  Administered 2020-05-07: 25 mg via ORAL
  Filled 2020-05-07: qty 1

## 2020-05-07 MED ORDER — BUMETANIDE 1 MG PO TABS
1.0000 mg | ORAL_TABLET | Freq: Once | ORAL | Status: DC
  Filled 2020-05-07: qty 1

## 2020-05-07 MED ORDER — APIXABAN 5 MG PO TABS
5.0000 mg | ORAL_TABLET | Freq: Once | ORAL | Status: AC
  Administered 2020-05-07: 5 mg via ORAL
  Filled 2020-05-07: qty 1

## 2020-05-07 NOTE — ED Triage Notes (Signed)
Pt c/o CP and SOB that began today. Hx of CHF

## 2020-05-07 NOTE — ED Notes (Signed)
Called pharm to request meds.

## 2020-05-07 NOTE — ED Provider Notes (Signed)
[facility] EMERGENCY DEPARTMENT Provider Note   CSN: [id number] Arrival date & time: 05/07/20  0128     History Chief Complaint  Patient presents with  . Chest Pain    Charles Gray is a 65 y.o. male.  HPI   65 year old male with past medical history of COPD, cirrhosis, DVT/PE on Eliquis, diastolic CHF presents to the emergency department with left-sided chest pain.  Patient states this started yesterday afternoon while he was sitting and watching TV.  It was sharp, radiated into his left arm.  The pain is intermittent, does completely self resolve, lasts only a couple minutes.  The pain is currently not present.  Denies any respiratory symptoms, cough.  No recent fever or illness.  Patient has baseline swelling in his lower extremities that is improved if anything.  Currently on antibiotics for cellulitis of the right foot.  Has been compliant with his medications except for his morning meds today due to being in the emergency department.  Patient follows with outpatient cardiology Dr. [name].  Past Medical History:  Diagnosis Date  . Acute on chronic congestive heart failure ([place])   . Acute on chronic diastolic CHF (congestive heart failure) ([place]) 08/23/2019  . Acute on chronic right-sided heart failure ([place]) 08/06/2019  . Acute respiratory failure with hypoxia ([place])   . Anemia of chronic disease   . Atrial fibrillation ([place]) 12/23/2019  . Atrial tachycardia ([place]) 08/12/2019  . Benign essential HTN   . Chest pain 08/23/2019  . Chronic anticoagulation 12/26/2019  . Chronic heart failure with preserved EF 05/30/2019   Echo 07/2019: EF 55-60, GR 1 DD  . Chronic pain syndrome   . Cirrhosis of liver without ascites ([place])   . Class 2 severe obesity due to excess calories with serious comorbidity and body mass index (BMI) of 39.0 to 39.9 in adult ([place]) 05/30/2019  . Debility 03/13/2019  . Drug induced constipation   . Dyspnea   . Fatty liver disease, nonalcoholic   .  GIB (gastrointestinal bleeding) 03/04/2019  . Heart failure, systolic, acute ([place]) 1/58/3094  . History of pulmonary embolism 08/12/2019  . Hyperlipidemia 01/24/2019  . Hypertension 01/24/2019  . Hypoalbuminemia due to protein-calorie malnutrition ([place])   . Hypokalemia   . Hyponatremia   . Hypotension due to drugs   . Insomnia 01/24/2019  . Lactic acidosis 08/23/2019  . Lumbar disc herniation 01/24/2019  . Mixed hyperlipidemia 01/24/2019  . Morbid obesity with BMI of 40.0-44.9, adult ([place]) 05/30/2019  . Normocytic anemia 06/08/2019  . Occasional tremors 08/12/2019  . OSA (obstructive sleep apnea) 08/12/2019  . Palpitations 12/26/2019  . Peripheral edema   . Personal history of DVT (deep vein thrombosis) 12/26/2019  . Portal hypertensive gastropathy ([place]) 03/06/2019   Seen on EGD 03/06/2019  . Pulmonary emboli ([place]) 06/08/2019  . Pulmonary embolism ([place]) 06/08/2019  . Redness and swelling of lower leg 08/23/2019  . Shock ([place])   . Thrombocytopenia [facility])     Patient Active Problem List   Diagnosis Date Noted  . Atrial fibrillation with rapid ventricular response ([place]) 03/24/2020  . Acute on chronic right heart failure ([place]) 01/27/2020  . Acute respiratory failure with hypoxia ([place])   . Fatty liver disease, nonalcoholic   . Shock ([place])   . Personal history of DVT (deep vein thrombosis) 12/26/2019  . Chronic anticoagulation 12/26/2019  . Palpitations 12/26/2019  . Atrial fibrillation ([place]) 12/23/2019  . Heart failure, systolic, acute ([place]) 07/68/0881  . Acute on chronic congestive heart  failure ([place])   . Acute on chronic diastolic CHF (congestive heart failure) ([place]) 08/23/2019  . Redness and swelling of lower leg 08/23/2019  . Chest pain 08/23/2019  . Lactic acidosis 08/23/2019  . History of pulmonary embolism 08/12/2019  . Atrial tachycardia ([name]) 08/12/2019  . Occasional tremors 08/12/2019  . OSA on CPAP 08/12/2019  . Acute on chronic right-sided heart failure ([place]) 08/06/2019  .  Pulmonary embolism ([place]) 06/08/2019  . Normocytic anemia 06/08/2019  . Pulmonary emboli ([place]) 06/08/2019  . Chronic heart failure with preserved ejection fraction ([place]) 05/30/2019  . Class 2 severe obesity due to excess calories with serious comorbidity and body mass index (BMI) of 39.0 to 39.9 in adult ([place]) 05/30/2019  . Morbid obesity with BMI of 40.0-44.9, adult ([place]) 05/30/2019  . Hyponatremia   . Benign essential HTN   . Drug induced constipation   . Peripheral edema   . Hypotension due to drugs   . Hypoalbuminemia due to protein-calorie malnutrition ([place])   . Hypokalemia   . Thrombocytopenia ([place])   . Anemia of chronic disease   . Cirrhosis of liver without ascites ([place])   . Chronic pain syndrome   . Debility 03/13/2019  . Portal hypertensive gastropathy ([place]) 03/06/2019  . Dyspnea   . GIB (gastrointestinal bleeding) 03/04/2019  . Mixed hyperlipidemia 01/24/2019  . Insomnia 01/24/2019  . Hyperlipidemia 01/24/2019  . Hypertension 01/24/2019  . Lumbar disc herniation 01/24/2019    Past Surgical History:  Procedure Laterality Date  . ESOPHAGOGASTRODUODENOSCOPY (EGD) WITH PROPOFOL N/A 03/05/2019   Procedure: ESOPHAGOGASTRODUODENOSCOPY (EGD) WITH PROPOFOL;  Surgeon: Doran Stabler, MD;  Location: [facility];  Service: Gastroenterology;  Laterality: N/A;  Large Blood Clot Removed  . ESOPHAGOGASTRODUODENOSCOPY (EGD) WITH PROPOFOL N/A 03/06/2019   Procedure: ESOPHAGOGASTRODUODENOSCOPY (EGD) WITH PROPOFOL;  Surgeon: Thornton Park, MD;  Location: [facility];  Service: Gastroenterology;  Laterality: N/A;  . HEMORROIDECTOMY    . RIGHT HEART CATH N/A 08/09/2019   Procedure: RIGHT HEART CATH;  Surgeon: Larey Dresser, MD;  Location: [facility];  Service: Cardiovascular;  Laterality: N/A;       Family History  Problem Relation Age of Onset  . Hyperlipidemia Mother   . Hypertension Mother   . Stroke Mother   . CAD Maternal Grandmother   . CAD Maternal  Grandfather     Social History   Tobacco Use  . Smoking status: Never Smoker  . Smokeless tobacco: Never Used  Vaping Use  . Vaping Use: Never used  Substance Use Topics  . Alcohol use: Not Currently  . Drug use: Not Currently    Types: Marijuana    Comment: Smoked for 30 years    Home Medications Prior to Admission medications   Medication Sig Start Date End Date Taking? Authorizing Provider  apixaban (ELIQUIS) 5 MG TABS tablet Take 1 tablet (5 mg total) by mouth 2 (two) times daily. 08/12/19   Strader, Fransisco Hertz, PA-C  bumetanide (BUMEX) 1 MG tablet Take 3 tablets (3 mg total) by mouth 2 (two) times daily. 03/04/20   Bensimhon, Shaune Pascal, MD  Buprenorphine HCl-Naloxone HCl 8-2 MG FILM Place 1 tablet under the tongue in the morning and at bedtime.     [provider]  empagliflozin (JARDIANCE) 10 MG TABS tablet Take 1 tablet (10 mg total) by mouth daily before breakfast. 04/11/20   Larey Dresser, MD  eplerenone (INSPRA) 50 MG tablet Take 2 tablets (100 mg total) by mouth daily. 02/27/20  Clegg, [name] D, NP  gabapentin (NEURONTIN) 800 MG tablet Take 1 tablet (800 mg total) by mouth 3 (three) times daily. 03/23/19   Angiulli, Lavon Paganini, PA-C  metolazone (ZAROXOLYN) 2.5 MG tablet Take 1 tablet (2.5 mg total) by mouth 2 (two) times a week. Tuesdays and Fridays with extra KCL 55mq 04/11/20   MLarey Dresser MD  metoprolol succinate (TOPROL XL) 25 MG 24 hr tablet Take 1 tablet (25 mg total) by mouth daily. 04/11/20   MLarey Dresser MD  ondansetron (ZOFRAN ODT) 4 MG disintegrating tablet Take 1 tablet (4 mg total) by mouth every 8 (eight) hours as needed for nausea or vomiting. 04/24/20   MLarey Dresser MD  pantoprazole (PROTONIX) 40 MG tablet Take 40 mg by mouth 2 (two) times daily.    [provider]  polyethylene glycol (MIRALAX / GLYCOLAX) 17 g packet Take 17 g by mouth daily. 03/23/19   Angiulli, DLavon Paganini PA-C  potassium chloride SA (KLOR-CON) 20 MEQ tablet  Take 40 mEq by mouth in the morning and at bedtime.    [provider]  pravastatin (PRAVACHOL) 20 MG tablet Take 1 tablet (20 mg total) by mouth daily. 03/23/19   Angiulli, DLavon Paganini PA-C  VENTOLIN HFA 108 (90 Base) MCG/ACT inhaler Inhale 1 puff into the lungs every 4 (four) hours as needed for shortness of breath. 03/23/19   Angiulli, DLavon Paganini PA-C  zolpidem (AMBIEN) 10 MG tablet Take 10 mg by mouth at bedtime as needed for sleep.  05/19/19   [provider]    Allergies    Penicillins  Review of Systems   Review of Systems  Constitutional: Negative for chills and fever.  HENT: Negative for congestion.   Eyes: Negative for visual disturbance.  Respiratory: Negative for shortness of breath.   Cardiovascular: Positive for chest pain.  Gastrointestinal: Negative for abdominal pain, diarrhea and vomiting.  Genitourinary: Negative for dysuria.  Skin: Negative for rash.  Neurological: Negative for headaches.    Physical Exam Updated Vital Signs BP 116/74 (BP Location: Right Arm)   Pulse 64   Temp 98.3 F (36.8 C) (Oral)   Resp 14   Ht 6' 10"  (2.083 m)   Wt (!) 163.7 kg   SpO2 100%   BMI 37.75 kg/m   Physical Exam Vitals and nursing note reviewed.  Constitutional:      Appearance: Normal appearance.  HENT:     Head: Normocephalic.     Mouth/Throat:     Mouth: Mucous membranes are moist.  Cardiovascular:     Rate and Rhythm: Normal rate.     Comments: Tenderness to palpation of the left costochondral joint and left side of the chest, pain reproducible with movement of the left upper extremity as well Pulmonary:     Effort: Pulmonary effort is normal. No respiratory distress.     Breath sounds: No decreased breath sounds.  Chest:     Chest wall: Tenderness present. No deformity or crepitus.  Abdominal:     Palpations: Abdomen is soft.     Tenderness: There is no abdominal tenderness.  Musculoskeletal:     Comments: Chronic venous stasis changes of  bilateral lower extremities that the patient says is currently improved.  Mild area of redness on the right foot that the patient is on oral antibiotics for.  Skin:    General: Skin is warm.  Neurological:     Mental Status: He is alert and oriented to person, place, and time. Mental  status is at baseline.  Psychiatric:        Mood and Affect: Mood normal.     ED Results / Procedures / Treatments   Labs (all labs ordered are listed, but only abnormal results are displayed) Labs Reviewed  BASIC METABOLIC PANEL - Abnormal; Notable for the following components:      Result Value   Sodium 133 (*)    Chloride 91 (*)    Glucose, Bld 108 (*)    All other components within normal limits  CBC - Abnormal; Notable for the following components:   MCV 79.3 (*)    MCH 25.6 (*)    RDW 15.9 (*)    All other components within normal limits  PROTIME-INR - Abnormal; Notable for the following components:   Prothrombin Time 16.5 (*)    INR 1.4 (*)    All other components within normal limits  BRAIN NATRIURETIC PEPTIDE - Abnormal; Notable for the following components:   B Natriuretic Peptide 351.2 (*)    All other components within normal limits  TROPONIN I (HIGH SENSITIVITY)  TROPONIN I (HIGH SENSITIVITY)    EKG EKG Interpretation  Date/Time:  Tuesday May 07 2020 01:36:34 EST Ventricular Rate:  85 PR Interval:  148 QRS Duration: 100 QT Interval:  384 QTC Calculation: 456 R Axis:   -76 Text Interpretation: Sinus rhythm with Premature atrial complexes Low voltage QRS Left anterior fascicular block Abnormal ECG When compared with ECG of 05/01/2020, Premature atrial complexes are now present Confirmed by Delora Fuel ([id number]) on 05/07/2020 2:38:04 AM   Radiology DG Chest 2 View  Result Date: 05/07/2020 CLINICAL DATA:  Chest pain EXAM: CHEST - 2 VIEW COMPARISON:  None. FINDINGS: The heart size and mediastinal contours are within normal limits. Both lungs are clear. The visualized skeletal  structures are unremarkable. IMPRESSION: No active cardiopulmonary disease. Electronically Signed   By: Fidela Salisbury MD   On: 05/07/2020 01:55    Procedures Procedures   Medications Ordered in ED Medications - No data to display  ED Course  I have reviewed the triage vital signs and the nursing notes.  Pertinent labs & imaging results that were available during my care of the patient were reviewed by me and considered in my medical decision making (see chart for details).  Clinical Course as of 05/07/20 1438  Tue May 07, 2020  1048 Potassium: 4.0 [WF]    Clinical Course User Index [WF] Tedd Sias, [place]   MDM Rules/Calculators/A&P                          Patient presents with left-sided sharp chest pain that has been intermittent, nonexertional.  It is reproducible with palpation and movement of the left upper extremity.  Reassuring with the reproducible nature.  EKG shows sinus rhythm with PACs but no ischemic changes when compared to previous EKGs.  No active pain at rest.  Chest x-ray shows no acute finding, blood work is reassuring, 2 - troponins with one-point delta.  I do not appreciate the patient's chest pain to be cardiac, however he is higher risk.  I spoke with the patient's cardiologist Dr. [name] and we reviewed the EKG and blood work and nature of his chest pain.  With the reproducible nature and the fact that he recently had a CT [facility] in July/2021 that Dr. [name] was able to review we have low suspicion for ischemic cardiac disease at this time.  On reevaluation  patient is well, I have explained the results and the need for outpatient cardiology follow-up and further treatment.  But at this time I do not see the need for emergent admission.  Low suspicion for PE given that he is anticoagulated and compliant with Eliquis.  Will give patient his home meds to keep him on schedule.  Patient will be discharged and treated as an outpatient.  Discharge plan and strict return to ED  precautions discussed, patient verbalizes understanding and agreement.  Final Clinical Impression(s) / ED Diagnoses Final diagnoses:  None    Rx / DC Orders ED Discharge Orders    None       Lorelle Gibbs, DO 05/07/20 1519

## 2020-05-07 NOTE — Discharge Instructions (Addendum)
You have been seen and discharged from the emergency department.  Your cardiologist was contacted at this visit, follow-up in the office with them for further evaluation/treatment.  Follow-up with your primary provider for reevaluation. Take medications as prescribed. If you have any worsening symptoms or further concerns for health please return to an emergency department for further evaluation.

## 2020-05-07 NOTE — Telephone Encounter (Signed)
1/24 - Charles Gray texted me reporting he has lost 12 lbs and his heart rate is fluctuating from 90 to 140 without exertion. Charles Gray stated he was experiencing some dizziness but at the time denied shortness of breath or chest pain. I reported same to HF clinic and they advised no changes until home visit scheduled for 1/25 at 0900.    1/25 0800- Charles Gray texted me reporting he had gone to ER for chest pain and increased heart rate over night. [facility]. I updated HF clinic staff.    I will follow up once discharged.

## 2020-05-07 NOTE — ED Notes (Signed)
Patient verbalizes understanding of discharge instructions. Opportunity for questioning and answers were provided. Armband removed by staff, pt discharged from ED.  

## 2020-05-08 ENCOUNTER — Telehealth (HOSPITAL_COMMUNITY): Payer: Self-pay

## 2020-05-08 ENCOUNTER — Other Ambulatory Visit (HOSPITAL_COMMUNITY): Payer: Self-pay | Admitting: *Deleted

## 2020-05-08 ENCOUNTER — Other Ambulatory Visit (HOSPITAL_COMMUNITY)
Admission: RE | Admit: 2020-05-08 | Discharge: 2020-05-08 | Disposition: A | Discharge status: Home / Self Care | Payer: Medicare HMO | Source: Ambulatory Visit | Attending: Cardiology | Admitting: Cardiology

## 2020-05-08 DIAGNOSIS — Z20822 Contact with and (suspected) exposure to covid-19: Secondary | ICD-10-CM | POA: Diagnosis not present

## 2020-05-08 DIAGNOSIS — Z01812 Encounter for preprocedural laboratory examination: Secondary | ICD-10-CM | POA: Diagnosis present

## 2020-05-08 DIAGNOSIS — I5032 Chronic diastolic (congestive) heart failure: Secondary | ICD-10-CM

## 2020-05-08 LAB — SARS CORONAVIRUS 2 (TAT 6-24 HRS): SARS Coronavirus 2: NEGATIVE

## 2020-05-08 NOTE — Telephone Encounter (Signed)
1115: Pt completed pre-procedure Covid test today. Updated patient on plan for Friday. Right and Left heart cath scheduled at 12:30pm with Dr. [name].  Per Dr. [name] Hold eliquis until after procedure. Hold Jardiance on Friday. NPO Friday at 0001.   [facility] has been set up to bring you to the hospital by 10AM Friday 1/28, they will call you by 6pm Thursday to verify time of arrival to your apt for Friday. Come to Main entrance and go to admitting to get checked in for your procedure. Plan is to be DC after procedure and post-care time, around 4pm; there is a possibility you might need to be admitted if your cath results are unfavorable for more monitoring and med management. IF you are DC you will need private transportation back home. RN called son, [name], ([phone])  to inform of planned procedure Friday and arrange transportation post-cath. Left VM, awaiting return call for confirmation.

## 2020-05-08 NOTE — Telephone Encounter (Signed)
Pt left text message on navigator work phone and with Nira Conn, EMT paramedicine regarding "Please I need help right now I'm very skared". RN returned call this AM to follow up with patient. Pt said message was sent around 6pm after DC from ED, pt was seen by ED physician and plan is to be seen as outpt with [facility]. Confirmed pt has appts scheduled in March with Dr. [name]. Pt states he is feeling a little better this AM after sleeping all night. Encouraged pt to take medications as prescribed today, pt did not take metolazone yesterday as is his normal day but he was in the ED with no meds. Is nervous about taking metolazone bc he "lost 10-16# last two times he has taken it".   Will clarify with Dr. [name] today if he wants an earlier appt and/or if metolazone should be taken today.   0840: Per Dr. Kristine Royal L/R heart cath for Friday outpatient. Take metolazone today.   0920: left vm and text message regarding plan for covid testing today at [facility] thru site, [facility] to pick up at 0950. Take metolazone.   0939: attempted to call again- pt picked up, told plan and needs to get dressed at transportation will be there shortly to take for pre-procedure covid test. Will get son's information for pick up from outpt cath on Friday around 4pm. Will call pt back with pre-cath instructions after covid screening completed today.

## 2020-05-10 ENCOUNTER — Other Ambulatory Visit (HOSPITAL_COMMUNITY): Payer: Self-pay

## 2020-05-10 ENCOUNTER — Encounter (HOSPITAL_COMMUNITY)
Admission: RE | Disposition: A | Discharge status: Home / Self Care | Payer: Self-pay | Source: Home / Self Care | Attending: Cardiology

## 2020-05-10 ENCOUNTER — Ambulatory Visit (HOSPITAL_COMMUNITY)
Admission: RE | Admit: 2020-05-10 | Discharge: 2020-05-10 | Disposition: A | Discharge status: Home / Self Care | Payer: Medicare HMO | Attending: Cardiology | Admitting: Cardiology

## 2020-05-10 ENCOUNTER — Telehealth (HOSPITAL_COMMUNITY): Payer: Self-pay

## 2020-05-10 ENCOUNTER — Other Ambulatory Visit: Payer: Self-pay

## 2020-05-10 DIAGNOSIS — I110 Hypertensive heart disease with heart failure: Secondary | ICD-10-CM | POA: Diagnosis present

## 2020-05-10 DIAGNOSIS — I5033 Acute on chronic diastolic (congestive) heart failure: Secondary | ICD-10-CM | POA: Diagnosis not present

## 2020-05-10 DIAGNOSIS — R079 Chest pain, unspecified: Secondary | ICD-10-CM | POA: Diagnosis not present

## 2020-05-10 DIAGNOSIS — I5032 Chronic diastolic (congestive) heart failure: Secondary | ICD-10-CM

## 2020-05-10 DIAGNOSIS — I509 Heart failure, unspecified: Secondary | ICD-10-CM | POA: Diagnosis not present

## 2020-05-10 DIAGNOSIS — Z7901 Long term (current) use of anticoagulants: Secondary | ICD-10-CM | POA: Diagnosis not present

## 2020-05-10 DIAGNOSIS — Z880 Allergy status to penicillin: Secondary | ICD-10-CM | POA: Insufficient documentation

## 2020-05-10 DIAGNOSIS — Z79899 Other long term (current) drug therapy: Secondary | ICD-10-CM | POA: Insufficient documentation

## 2020-05-10 DIAGNOSIS — I480 Paroxysmal atrial fibrillation: Secondary | ICD-10-CM | POA: Insufficient documentation

## 2020-05-10 DIAGNOSIS — G4733 Obstructive sleep apnea (adult) (pediatric): Secondary | ICD-10-CM | POA: Diagnosis not present

## 2020-05-10 DIAGNOSIS — I2510 Atherosclerotic heart disease of native coronary artery without angina pectoris: Secondary | ICD-10-CM | POA: Diagnosis not present

## 2020-05-10 HISTORY — PX: RIGHT/LEFT HEART CATH AND CORONARY ANGIOGRAPHY: CATH118266

## 2020-05-10 LAB — POCT I-STAT EG7
Acid-Base Excess: 5 mmol/L — ABNORMAL HIGH (ref 0.0–2.0)
Acid-Base Excess: 7 mmol/L — ABNORMAL HIGH (ref 0.0–2.0)
Bicarbonate: 31.6 mmol/L — ABNORMAL HIGH (ref 20.0–28.0)
Bicarbonate: 33.2 mmol/L — ABNORMAL HIGH (ref 20.0–28.0)
Calcium, Ion: 1.18 mmol/L (ref 1.15–1.40)
Calcium, Ion: 1.22 mmol/L (ref 1.15–1.40)
HCT: 38 % — ABNORMAL LOW (ref 39.0–52.0)
HCT: 38 % — ABNORMAL LOW (ref 39.0–52.0)
Hemoglobin: 12.9 g/dL — ABNORMAL LOW (ref 13.0–17.0)
Hemoglobin: 12.9 g/dL — ABNORMAL LOW (ref 13.0–17.0)
O2 Saturation: 68 %
O2 Saturation: 70 %
Potassium: 3.7 mmol/L (ref 3.5–5.1)
Potassium: 3.8 mmol/L (ref 3.5–5.1)
Sodium: 141 mmol/L (ref 135–145)
Sodium: 141 mmol/L (ref 135–145)
TCO2: 33 mmol/L — ABNORMAL HIGH (ref 22–32)
TCO2: 35 mmol/L — ABNORMAL HIGH (ref 22–32)
pCO2, Ven: 52 mmHg (ref 44.0–60.0)
pCO2, Ven: 53.1 mmHg (ref 44.0–60.0)
pH, Ven: 7.392 (ref 7.250–7.430)
pH, Ven: 7.405 (ref 7.250–7.430)
pO2, Ven: 36 mmHg (ref 32.0–45.0)
pO2, Ven: 38 mmHg (ref 32.0–45.0)

## 2020-05-10 SURGERY — RIGHT/LEFT HEART CATH AND CORONARY ANGIOGRAPHY
Anesthesia: LOCAL

## 2020-05-10 MED ORDER — SODIUM CHLORIDE 0.9 % IV SOLN: 250.0000 mL | INTRAVENOUS | Status: DC | PRN

## 2020-05-10 MED ORDER — SODIUM CHLORIDE 0.9 % IV SOLN: INTRAVENOUS | Status: DC

## 2020-05-10 MED ORDER — BUMETANIDE 1 MG PO TABS: ORAL_TABLET | ORAL | 3 refills | Status: DC

## 2020-05-10 MED ORDER — ONDANSETRON HCL 4 MG/2ML IJ SOLN: 4.0000 mg | Freq: Four times a day (QID) | INTRAMUSCULAR | Status: DC | PRN

## 2020-05-10 MED ORDER — HEPARIN (PORCINE) IN NACL 1000-0.9 UT/500ML-% IV SOLN
INTRAVENOUS | Status: AC
  Filled 2020-05-10: qty 500

## 2020-05-10 MED ORDER — SODIUM CHLORIDE 0.9% FLUSH: 3.0000 mL | Freq: Two times a day (BID) | INTRAVENOUS | Status: DC

## 2020-05-10 MED ORDER — HEPARIN (PORCINE) IN NACL 1000-0.9 UT/500ML-% IV SOLN
INTRAVENOUS | Status: DC | PRN
  Administered 2020-05-10: 500 mL

## 2020-05-10 MED ORDER — HEPARIN (PORCINE) IN NACL 1000-0.9 UT/500ML-% IV SOLN
INTRAVENOUS | Status: AC
  Filled 2020-05-10: qty 1000

## 2020-05-10 MED ORDER — VERAPAMIL HCL 2.5 MG/ML IV SOLN
INTRAVENOUS | Status: AC
  Filled 2020-05-10: qty 2

## 2020-05-10 MED ORDER — HEPARIN SODIUM (PORCINE) 1000 UNIT/ML IJ SOLN
INTRAMUSCULAR | Status: AC
  Filled 2020-05-10: qty 1

## 2020-05-10 MED ORDER — LABETALOL HCL 5 MG/ML IV SOLN: 10.0000 mg | INTRAVENOUS | Status: DC | PRN

## 2020-05-10 MED ORDER — FENTANYL CITRATE (PF) 100 MCG/2ML IJ SOLN
INTRAMUSCULAR | Status: DC | PRN
  Administered 2020-05-10 (×2): 25 ug via INTRAVENOUS

## 2020-05-10 MED ORDER — ASPIRIN 81 MG PO CHEW
CHEWABLE_TABLET | ORAL | Status: AC
  Filled 2020-05-10: qty 1

## 2020-05-10 MED ORDER — FENTANYL CITRATE (PF) 100 MCG/2ML IJ SOLN
INTRAMUSCULAR | Status: AC
  Filled 2020-05-10: qty 2

## 2020-05-10 MED ORDER — VERAPAMIL HCL 2.5 MG/ML IV SOLN
INTRAVENOUS | Status: DC | PRN
  Administered 2020-05-10: 10 mL via INTRA_ARTERIAL

## 2020-05-10 MED ORDER — SODIUM CHLORIDE 0.9% FLUSH: 3.0000 mL | INTRAVENOUS | Status: DC | PRN

## 2020-05-10 MED ORDER — IOHEXOL 350 MG/ML SOLN
INTRAVENOUS | Status: DC | PRN
  Administered 2020-05-10: 60 mL

## 2020-05-10 MED ORDER — HYDRALAZINE HCL 20 MG/ML IJ SOLN: 10.0000 mg | INTRAMUSCULAR | Status: DC | PRN

## 2020-05-10 MED ORDER — ASPIRIN 81 MG PO CHEW
81.0000 mg | CHEWABLE_TABLET | ORAL | Status: AC
  Administered 2020-05-10: 81 mg via ORAL

## 2020-05-10 MED ORDER — LIDOCAINE HCL (PF) 1 % IJ SOLN
INTRAMUSCULAR | Status: AC
  Filled 2020-05-10: qty 30

## 2020-05-10 MED ORDER — HEPARIN SODIUM (PORCINE) 1000 UNIT/ML IJ SOLN
INTRAMUSCULAR | Status: DC | PRN
  Administered 2020-05-10: 7000 [IU] via INTRAVENOUS

## 2020-05-10 MED ORDER — LIDOCAINE HCL (PF) 1 % IJ SOLN
INTRAMUSCULAR | Status: DC | PRN
  Administered 2020-05-10: 1 mL
  Administered 2020-05-10: 2 mL

## 2020-05-10 MED ORDER — ACETAMINOPHEN 325 MG PO TABS: 650.0000 mg | ORAL_TABLET | ORAL | Status: DC | PRN

## 2020-05-10 SURGICAL SUPPLY — 14 items
BAG SNAP BAND KOVER 36X36 (MISCELLANEOUS) ×2 IMPLANT
CATH 5FR JL3.5 JR4 ANG PIG MP (CATHETERS) ×2 IMPLANT
CATH BALLN WEDGE 5F 110CM (CATHETERS) ×2 IMPLANT
COVER DOME SNAP 22 D (MISCELLANEOUS) ×2 IMPLANT
DEVICE RAD COMP TR BAND LRG (VASCULAR PRODUCTS) ×2 IMPLANT
GLIDESHEATH SLEND SS 6F .021 (SHEATH) ×2 IMPLANT
GUIDEWIRE .025 260CM (WIRE) ×2 IMPLANT
GUIDEWIRE INQWIRE 1.5J.035X260 (WIRE) ×1 IMPLANT
INQWIRE 1.5J .035X260CM (WIRE) ×2
KIT HEART LEFT (KITS) ×2 IMPLANT
PACK CARDIAC CATHETERIZATION (CUSTOM PROCEDURE TRAY) ×2 IMPLANT
SHEATH GLIDE SLENDER 4/5FR (SHEATH) ×2 IMPLANT
SHEATH PROBE COVER 6X72 (BAG) ×2 IMPLANT
TRANSDUCER W/STOPCOCK (MISCELLANEOUS) ×2 IMPLANT

## 2020-05-10 NOTE — Discharge Instructions (Signed)
1. Increase bumetanide to 4 mg in the morning and 3 mg in the evening.  2. Restart Eliquis tomorrow morning.    Radial Site Care  This sheet gives you information about how to care for yourself after your procedure. Your health care provider may also give you more specific instructions. If you have problems or questions, contact your health care provider. What can I expect after the procedure? After the procedure, it is common to have:  Bruising and tenderness at the catheter insertion area. Follow these instructions at home: Medicines  Take over-the-counter and prescription medicines only as told by your health care provider. Insertion site care 1. Follow instructions from your health care provider about how to take care of your insertion site. Make sure you: ? Wash your hands with soap and water before you change your bandage (dressing). If soap and water are not available, use hand sanitizer. ? Change your dressing as told by your health care provider. 2. Check your insertion site every day for signs of infection. Check for: ? Redness, swelling, or pain. ? Fluid or blood. ? Pus or a bad smell. ? Warmth. 3. Do not take baths, swim, or use a hot tub for 5 days. 4. You may shower 24-48 hours after the procedure. ? Remove the dressing and gently wash the site with plain soap and water. ? Pat the area dry with a clean towel. ? Do not rub the site. That could cause bleeding. 5. Do not apply powder or lotion to the site. Activity  1. For 24 hours after the procedure, or as directed by your health care provider: ? Do not flex or bend the affected arm. ? Do not push or pull heavy objects with the affected arm. ? Do not drive yourself home from the hospital or clinic. You may drive 24 hours after the procedure. ? Do not operate machinery or power tools. ? KEEP ARM ELEVATED THE REMAINDER OF THE DAY. 2. Do not push, pull or lift anything that is heavier than 10 lb for 5 days. 3. Ask  your health care provider when it is okay to: ? Return to work or school. ? Resume usual physical activities or sports. ? Resume sexual activity. General instructions  If the catheter site starts to bleed, raise your arm and put firm pressure on the site. If the bleeding does not stop, get help right away. This is a medical emergency.  DRINK PLENTY OF FLUIDS FOR THE NEXT 2-3 DAYS.  If you went home on the same day as your procedure, a responsible adult should be with you for the first 24 hours after you arrive home.  Keep all follow-up visits as told by your health care provider. This is important. Contact a health care provider if:  You have a fever.  You have redness, swelling, or yellow drainage around your insertion site. Get help right away if:  You have unusual pain at the radial site.  The catheter insertion area swells very fast.  The insertion area is bleeding, and the bleeding does not stop when you hold steady pressure on the area.  Your arm or hand becomes pale, cool, tingly, or numb. These symptoms may represent a serious problem that is an emergency. Do not wait to see if the symptoms will go away. Get medical help right away. Call your local emergency services (911 in the [place] Do not drive yourself to the hospital. Summary  After the procedure, it is common to have bruising  and tenderness at the site.  Follow instructions from your health care provider about how to take care of your radial site wound. Check the wound every day for signs of infection.  Do not push, pull or lift anything that is heavier than 10 lb for 5 days.  This information is not intended to replace advice given to you by your health care provider. Make sure you discuss any questions you have with your health care provider. Document Revised: 05/05/2017 Document Reviewed: 05/05/2017 Elsevier Patient Education  2020 [employer].

## 2020-05-10 NOTE — Telephone Encounter (Signed)
Pt in post cath/recovery.  Text patient about new lab draw appt scheduled for 05/15/2020 @ 3:15pm.  Pt to call [facility] to [facility] up ride to HF clinic. Pt already has number saved in phone. Pt also reminded of changed to medication: Bumex 58m AM, bumex 382mevening.   Paramedicine, HeTheodoro Clockpdate about med changes as well.

## 2020-05-10 NOTE — Progress Notes (Signed)
Discharge instructions reviewed with pt voices understanding.  

## 2020-05-10 NOTE — Progress Notes (Signed)
Dr to put in order for pt to go home alone. Pt states he will call his neighbor to see if she can come and check on him a few times. Pt son called and explained above. Asked son to check in on pt several times. Voices understanding.

## 2020-05-10 NOTE — Progress Notes (Signed)
Pt states that he has no one to stay with him for 24 hours after his procedure. Rennis Harding, Rn informed states that she will discuss with the Dr and call us back.

## 2020-05-10 NOTE — Interval H&P Note (Signed)
History and Physical Interval Note:  05/10/2020 1:39 PM  Charles Gray  has presented today for surgery, with the diagnosis of heart failure.  The various methods of treatment have been discussed with the patient and family. After consideration of risks, benefits and other options for treatment, the patient has consented to  Procedure(s): RIGHT/LEFT HEART CATH AND CORONARY ANGIOGRAPHY (N/A) as a surgical intervention.  The patient's history has been reviewed, patient examined, no change in status, stable for surgery.  I have reviewed the patient's chart and labs.  Questions were answered to the patient's satisfaction.     Joceline Hinchcliff [employer]

## 2020-05-10 NOTE — Progress Notes (Signed)
Spoke with patients son, [name].  He states he is not able to stay overnight with the patient and the patient can not stay with him.  He states he will check on patient several times tonight as well as in the morning.  Patient states he has a neighbor that lives across the hall from him that can check on him as well.  He said that neighbor couldn't stay with him but that she would check on him several times tonight as well as in the morning.

## 2020-05-13 ENCOUNTER — Encounter (HOSPITAL_COMMUNITY): Payer: Self-pay | Admitting: Cardiology

## 2020-05-14 ENCOUNTER — Telehealth (HOSPITAL_COMMUNITY): Payer: Self-pay | Admitting: Cardiology

## 2020-05-14 ENCOUNTER — Other Ambulatory Visit (HOSPITAL_COMMUNITY): Payer: Self-pay

## 2020-05-14 NOTE — Progress Notes (Signed)
Paramedicine Encounter    Patient ID: Charles Gray, male    DOB: 1956-01-10, 65 y.o.   MRN: [id number]   Patient Care Team: Enid Skeens., MD as PCP - General (Family Medicine) Berniece Salines, DO as PCP - Cardiology (Cardiology)  Patient Active Problem List   Diagnosis Date Noted  . Atrial fibrillation with rapid ventricular response ([place]) 03/24/2020  . Acute on chronic right heart failure ([place]) 01/27/2020  . Acute respiratory failure with hypoxia ([place])   . Fatty liver disease, nonalcoholic   . Shock ([place])   . Personal history of DVT (deep vein thrombosis) 12/26/2019  . Chronic anticoagulation 12/26/2019  . Palpitations 12/26/2019  . Atrial fibrillation ([place]) 12/23/2019  . Heart failure, systolic, acute ([place]) 56/21/3086  . Acute on chronic congestive heart failure ([place])   . Acute on chronic diastolic CHF (congestive heart failure) ([place]) 08/23/2019  . Redness and swelling of lower leg 08/23/2019  . Chest pain 08/23/2019  . Lactic acidosis 08/23/2019  . History of pulmonary embolism 08/12/2019  . Atrial tachycardia ([place]) 08/12/2019  . Occasional tremors 08/12/2019  . OSA on CPAP 08/12/2019  . Acute on chronic right-sided heart failure ([place]) 08/06/2019  . Pulmonary embolism ([place]) 06/08/2019  . Normocytic anemia 06/08/2019  . Pulmonary emboli ([place]) 06/08/2019  . Chronic heart failure with preserved ejection fraction ([place]) 05/30/2019  . Class 2 severe obesity due to excess calories with serious comorbidity and body mass index (BMI) of 39.0 to 39.9 in adult ([place]) 05/30/2019  . Morbid obesity with BMI of 40.0-44.9, adult ([place]) 05/30/2019  . Hyponatremia   . Benign essential HTN   . Drug induced constipation   . Peripheral edema   . Hypotension due to drugs   . Hypoalbuminemia due to protein-calorie malnutrition ([place])   . Hypokalemia   . Thrombocytopenia ([place])   . Anemia of chronic disease   . Cirrhosis of liver without ascites ([place])   . Chronic pain syndrome   . Debility  03/13/2019  . Portal hypertensive gastropathy ([place]) 03/06/2019  . Dyspnea   . GIB (gastrointestinal bleeding) 03/04/2019  . Mixed hyperlipidemia 01/24/2019  . Insomnia 01/24/2019  . Hyperlipidemia 01/24/2019  . Hypertension 01/24/2019  . Lumbar disc herniation 01/24/2019    Current Outpatient Medications:  .  acetaminophen (TYLENOL) 500 MG tablet, Take 1,000 mg by mouth every 6 (six) hours as needed for moderate pain or headache., Disp: , Rfl:  .  apixaban (ELIQUIS) 5 MG TABS tablet, Take 1 tablet (5 mg total) by mouth 2 (two) times daily., Disp: 60 tablet, Rfl: 0 .  bumetanide (BUMEX) 1 MG tablet, Take 4 tablets (4 mg total) by mouth daily with breakfast AND 3 tablets (3 mg total) every evening., Disp: 180 tablet, Rfl: 3 .  Buprenorphine HCl-Naloxone HCl 8-2 MG FILM, Place 1 tablet under the tongue in the morning and at bedtime. , Disp: , Rfl:  .  empagliflozin (JARDIANCE) 10 MG TABS tablet, Take 1 tablet (10 mg total) by mouth daily before breakfast., Disp: 30 tablet, Rfl: 3 .  eplerenone (INSPRA) 50 MG tablet, Take 2 tablets (100 mg total) by mouth daily., Disp: 60 tablet, Rfl: 3 .  gabapentin (NEURONTIN) 800 MG tablet, Take 1 tablet (800 mg total) by mouth 3 (three) times daily., Disp: 90 tablet, Rfl: 1 .  metolazone (ZAROXOLYN) 2.5 MG tablet, Take 1 tablet (2.5 mg total) by mouth 2 (two) times a week. Tuesdays and Fridays with extra KCL 50mq, Disp: 90 tablet, Rfl: 3 .  metoprolol  succinate (TOPROL XL) 25 MG 24 hr tablet, Take 1 tablet (25 mg total) by mouth daily., Disp: 30 tablet, Rfl: 3 .  ondansetron (ZOFRAN ODT) 4 MG disintegrating tablet, Take 1 tablet (4 mg total) by mouth every 8 (eight) hours as needed for nausea or vomiting., Disp: 20 tablet, Rfl: 0 .  pantoprazole (PROTONIX) 40 MG tablet, Take 40 mg by mouth 2 (two) times daily., Disp: , Rfl:  .  polyethylene glycol (MIRALAX / GLYCOLAX) 17 g packet, Take 17 g by mouth daily. (Patient taking differently: Take 17 g by mouth daily  as needed for moderate constipation.), Disp: 14 each, Rfl: 0 .  potassium chloride SA (KLOR-CON) 20 MEQ tablet, Take 40 mEq by mouth See admin instructions. Take 40 meq twice daily on Sun, Mon, Wed, Thurs, and Sat. Take 40 meq 3 times daily on Tues and Fri (metolazone days), Disp: , Rfl:  .  pravastatin (PRAVACHOL) 20 MG tablet, Take 1 tablet (20 mg total) by mouth daily., Disp: 30 tablet, Rfl: 1 .  sodium chloride (OCEAN) 0.65 % SOLN nasal spray, Place 1 spray into both nostrils as needed for congestion., Disp: , Rfl:  .  VENTOLIN HFA 108 (90 Base) MCG/ACT inhaler, Inhale 1 puff into the lungs every 4 (four) hours as needed for shortness of breath. (Patient taking differently: Inhale 1-2 puffs into the lungs every 4 (four) hours as needed for shortness of breath.), Disp: 6.7 g, Rfl: 1 .  zolpidem (AMBIEN) 10 MG tablet, Take 10 mg by mouth at bedtime as needed for sleep. , Disp: , Rfl:  Allergies  Allergen Reactions  . Penicillins     Broke out in convulsions as a child  Did it involve swelling of the face/tongue/throat, SOB, or low BP? N/A Did it involve sudden or severe rash/hives, skin peeling, or any reaction on the inside of your mouth or nose? N/A Did you need to seek medical attention at a hospital or doctor's office?N/A When did it last happen?N/A If all above answers are "NO", may proceed with cephalosporin use.     Social History   Socioeconomic History  . Marital status: Single    Spouse name: Not on file  . Number of children: Not on file  . Years of education: Not on file  . Highest education level: Not on file  Occupational History  . Not on file  Tobacco Use  . Smoking status: Never Smoker  . Smokeless tobacco: Never Used  Vaping Use  . Vaping Use: Never used  Substance and Sexual Activity  . Alcohol use: Not Currently  . Drug use: Not Currently    Types: Marijuana    Comment: Smoked for 30 years  . Sexual activity: Not on file  Other Topics Concern  . Not on  file  Social History Narrative  . Not on file   Social Determinants of Health   Financial Resource Strain: Low Risk   . Difficulty of Paying Living Expenses: Not very hard  Food Insecurity: No Food Insecurity  . Worried About Charity fundraiser in the Last Year: Never true  . Ran Out of Food in the Last Year: Never true  Transportation Needs: Unmet Transportation Needs  . Lack of Transportation (Medical): Yes  . Lack of Transportation (Non-Medical): Yes  Physical Activity: Not on file  Stress: Not on file  Social Connections: Not on file  Intimate Partner Violence: Not on file    Physical Exam Vitals reviewed.  Constitutional:  Appearance: Normal appearance. He is normal weight.  HENT:     Head: Normocephalic.     Nose: Nose normal.     Mouth/Throat:     Mouth: Mucous membranes are moist.     Pharynx: Oropharynx is clear.  Eyes:     Pupils: Pupils are equal, round, and reactive to light.  Cardiovascular:     Rate and Rhythm: Normal rate and regular rhythm.     Pulses: Normal pulses.     Heart sounds: Normal heart sounds.  Pulmonary:     Effort: Pulmonary effort is normal. No respiratory distress.     Breath sounds: Normal breath sounds. No stridor. No wheezing, rhonchi or rales.  Chest:     Chest wall: No tenderness.  Abdominal:     General: There is no distension.     Palpations: Abdomen is soft.     Tenderness: There is no abdominal tenderness.  Musculoskeletal:        General: Swelling present. No tenderness. Normal range of motion.     Cervical back: Normal range of motion.     Right lower leg: Edema present.     Left lower leg: Edema present.  Skin:    General: Skin is warm and dry.     Capillary Refill: Capillary refill takes less than 2 seconds.  Neurological:     General: No focal deficit present.     Mental Status: He is alert. Mental status is at baseline.  Psychiatric:        Mood and Affect: Mood normal.       Arrived for home visit for  Blaike who was alert and oriented reporting he was feeling "okay" today. Lavel reports his cath went well and his arm is still sore from same but healing nicely. Vitals were obtained. Medications were reviewed and pill box filled accordingly. Kmarion was stating he received word he was accepted for [facility] services at 28 hours per month, Alvis Lemmings is to follow up in the coming days. I will assist at following up with same. Patient has PCP visit on Feb. 15th I will have Jenna set up transport. Patient goes in for labs tomorrow in clinic. I will plan to see patient in one week in the home. Patient requesting hospital bed reports a doctor stated at some point they would write him a prescription for one but has yet to receive it. I will follow up with same. Home visit complete.   Refills: Gabapentin Ambien     Future Appointments  Date Time Provider [place]  05/15/2020  3:15 PM [place] [facility] None  06/13/2020  2:30 PM [facility] PA/NP [facility] None  06/14/2020  9:40 AM Larey Dresser, MD [facility] None     ACTION: Home visit completed Next visit planned for one week

## 2020-05-14 NOTE — Telephone Encounter (Signed)
Multiple education encounters/converstations regarding cardiomems Pt is not interested to proceed, will remove from work up list. Should patient change his mind in the future will re-add.

## 2020-05-15 ENCOUNTER — Other Ambulatory Visit: Payer: Self-pay

## 2020-05-15 ENCOUNTER — Ambulatory Visit (HOSPITAL_COMMUNITY)
Admit: 2020-05-15 | Discharge: 2020-05-15 | Disposition: A | Discharge status: Home / Self Care | Payer: Medicare HMO | Source: Ambulatory Visit | Attending: Internal Medicine | Admitting: Internal Medicine

## 2020-05-15 DIAGNOSIS — I5032 Chronic diastolic (congestive) heart failure: Secondary | ICD-10-CM | POA: Diagnosis present

## 2020-05-15 LAB — BASIC METABOLIC PANEL
Anion gap: 11 (ref 5–15)
BUN: 9 mg/dL (ref 8–23)
CO2: 29 mmol/L (ref 22–32)
Calcium: 9.1 mg/dL (ref 8.9–10.3)
Chloride: 97 mmol/L — ABNORMAL LOW (ref 98–111)
Creatinine, Ser: 0.86 mg/dL (ref 0.61–1.24)
GFR, Estimated: >60 mL/min (ref >60)
Glucose, Bld: 93 mg/dL (ref 70–99)
Potassium: 4 mmol/L (ref 3.5–5.1)
Sodium: 137 mmol/L (ref 135–145)

## 2020-05-21 ENCOUNTER — Other Ambulatory Visit (HOSPITAL_COMMUNITY): Payer: Self-pay

## 2020-05-21 NOTE — Progress Notes (Signed)
Paramedicine Encounter    Patient ID: Charles Gray, male    DOB: 1955/06/03, 65 y.o.   MRN: [id number]   Patient Care Team: Enid Skeens., MD as PCP - General (Family Medicine) Berniece Salines, DO as PCP - Cardiology (Cardiology)  Patient Active Problem List   Diagnosis Date Noted  . Atrial fibrillation with rapid ventricular response ([place]) 03/24/2020  . Acute on chronic right heart failure ([place]) 01/27/2020  . Acute respiratory failure with hypoxia ([place])   . Fatty liver disease, nonalcoholic   . Shock ([place])   . Personal history of DVT (deep vein thrombosis) 12/26/2019  . Chronic anticoagulation 12/26/2019  . Palpitations 12/26/2019  . Atrial fibrillation ([place]) 12/23/2019  . Heart failure, systolic, acute ([place]) 67/67/2094  . Acute on chronic congestive heart failure ([place])   . Acute on chronic diastolic CHF (congestive heart failure) ([place]) 08/23/2019  . Redness and swelling of lower leg 08/23/2019  . Chest pain 08/23/2019  . Lactic acidosis 08/23/2019  . History of pulmonary embolism 08/12/2019  . Atrial tachycardia ([place]) 08/12/2019  . Occasional tremors 08/12/2019  . OSA on CPAP 08/12/2019  . Acute on chronic right-sided heart failure ([place]) 08/06/2019  . Pulmonary embolism ([place]) 06/08/2019  . Normocytic anemia 06/08/2019  . Pulmonary emboli ([place]) 06/08/2019  . Chronic heart failure with preserved ejection fraction ([place]) 05/30/2019  . Class 2 severe obesity due to excess calories with serious comorbidity and body mass index (BMI) of 39.0 to 39.9 in adult ([place]) 05/30/2019  . Morbid obesity with BMI of 40.0-44.9, adult ([place]) 05/30/2019  . Hyponatremia   . Benign essential HTN   . Drug induced constipation   . Peripheral edema   . Hypotension due to drugs   . Hypoalbuminemia due to protein-calorie malnutrition ([place])   . Hypokalemia   . Thrombocytopenia ([place])   . Anemia of chronic disease   . Cirrhosis of liver without ascites ([place])   . Chronic pain syndrome   . Debility  03/13/2019  . Portal hypertensive gastropathy ([place]) 03/06/2019  . Dyspnea   . GIB (gastrointestinal bleeding) 03/04/2019  . Mixed hyperlipidemia 01/24/2019  . Insomnia 01/24/2019  . Hyperlipidemia 01/24/2019  . Hypertension 01/24/2019  . Lumbar disc herniation 01/24/2019    Current Outpatient Medications:  .  acetaminophen (TYLENOL) 500 MG tablet, Take 1,000 mg by mouth every 6 (six) hours as needed for moderate pain or headache., Disp: , Rfl:  .  apixaban (ELIQUIS) 5 MG TABS tablet, Take 1 tablet (5 mg total) by mouth 2 (two) times daily., Disp: 60 tablet, Rfl: 0 .  bumetanide (BUMEX) 1 MG tablet, Take 4 tablets (4 mg total) by mouth daily with breakfast AND 3 tablets (3 mg total) every evening., Disp: 180 tablet, Rfl: 3 .  Buprenorphine HCl-Naloxone HCl 8-2 MG FILM, Place 1 tablet under the tongue in the morning and at bedtime. , Disp: , Rfl:  .  empagliflozin (JARDIANCE) 10 MG TABS tablet, Take 1 tablet (10 mg total) by mouth daily before breakfast., Disp: 30 tablet, Rfl: 3 .  eplerenone (INSPRA) 50 MG tablet, Take 2 tablets (100 mg total) by mouth daily., Disp: 60 tablet, Rfl: 3 .  gabapentin (NEURONTIN) 800 MG tablet, Take 1 tablet (800 mg total) by mouth 3 (three) times daily., Disp: 90 tablet, Rfl: 1 .  metolazone (ZAROXOLYN) 2.5 MG tablet, Take 1 tablet (2.5 mg total) by mouth 2 (two) times a week. Tuesdays and Fridays with extra KCL 70mq, Disp: 90 tablet, Rfl: 3 .  metoprolol  succinate (TOPROL XL) 25 MG 24 hr tablet, Take 1 tablet (25 mg total) by mouth daily., Disp: 30 tablet, Rfl: 3 .  ondansetron (ZOFRAN ODT) 4 MG disintegrating tablet, Take 1 tablet (4 mg total) by mouth every 8 (eight) hours as needed for nausea or vomiting., Disp: 20 tablet, Rfl: 0 .  pantoprazole (PROTONIX) 40 MG tablet, Take 40 mg by mouth 2 (two) times daily. (Patient not taking: Reported on 05/14/2020), Disp: , Rfl:  .  polyethylene glycol (MIRALAX / GLYCOLAX) 17 g packet, Take 17 g by mouth daily. (Patient  not taking: Reported on 05/14/2020), Disp: 14 each, Rfl: 0 .  potassium chloride SA (KLOR-CON) 20 MEQ tablet, Take 40 mEq by mouth See admin instructions. Take 40 meq twice daily on Sun, Mon, Wed, Thurs, and Sat. Take 40 meq 3 times daily on Tues and Fri (metolazone days), Disp: , Rfl:  .  pravastatin (PRAVACHOL) 20 MG tablet, Take 1 tablet (20 mg total) by mouth daily., Disp: 30 tablet, Rfl: 1 .  sodium chloride (OCEAN) 0.65 % SOLN nasal spray, Place 1 spray into both nostrils as needed for congestion., Disp: , Rfl:  .  VENTOLIN HFA 108 (90 Base) MCG/ACT inhaler, Inhale 1 puff into the lungs every 4 (four) hours as needed for shortness of breath. (Patient taking differently: Inhale 1-2 puffs into the lungs every 4 (four) hours as needed for shortness of breath.), Disp: 6.7 g, Rfl: 1 .  zolpidem (AMBIEN) 10 MG tablet, Take 10 mg by mouth at bedtime as needed for sleep. , Disp: , Rfl:  Allergies  Allergen Reactions  . Penicillins     Broke out in convulsions as a child  Did it involve swelling of the face/tongue/throat, SOB, or low BP? N/A Did it involve sudden or severe rash/hives, skin peeling, or any reaction on the inside of your mouth or nose? N/A Did you need to seek medical attention at a hospital or doctor's office?N/A When did it last happen?N/A If all above answers are "NO", may proceed with cephalosporin use.     Social History   Socioeconomic History  . Marital status: Single    Spouse name: Not on file  . Number of children: Not on file  . Years of education: Not on file  . Highest education level: Not on file  Occupational History  . Not on file  Tobacco Use  . Smoking status: Never Smoker  . Smokeless tobacco: Never Used  Vaping Use  . Vaping Use: Never used  Substance and Sexual Activity  . Alcohol use: Not Currently  . Drug use: Not Currently    Types: Marijuana    Comment: Smoked for 30 years  . Sexual activity: Not on file  Other Topics Concern  . Not on file   Social History Narrative  . Not on file   Social Determinants of Health   Financial Resource Strain: Low Risk   . Difficulty of Paying Living Expenses: Not very hard  Food Insecurity: No Food Insecurity  . Worried About Charity fundraiser in the Last Year: Never true  . Ran Out of Food in the Last Year: Never true  Transportation Needs: Unmet Transportation Needs  . Lack of Transportation (Medical): Yes  . Lack of Transportation (Non-Medical): Yes  Physical Activity: Not on file  Stress: Not on file  Social Connections: Not on file  Intimate Partner Violence: Not on file    Physical Exam Vitals reviewed.  Constitutional:  Appearance: Normal appearance. He is normal weight.  HENT:     Head: Normocephalic.     Nose: Nose normal.     Mouth/Throat:     Mouth: Mucous membranes are moist.     Pharynx: Oropharynx is clear.  Eyes:     Pupils: Pupils are equal, round, and reactive to light.  Cardiovascular:     Rate and Rhythm: Normal rate and regular rhythm.     Pulses: Normal pulses.     Heart sounds: Normal heart sounds.  Pulmonary:     Effort: Pulmonary effort is normal.     Breath sounds: Normal breath sounds.  Abdominal:     Palpations: Abdomen is soft.  Musculoskeletal:        General: Swelling present. Normal range of motion.     Cervical back: Normal range of motion.     Right lower leg: No edema.     Left lower leg: No edema.  Skin:    General: Skin is warm and dry.     Capillary Refill: Capillary refill takes less than 2 seconds.  Neurological:     General: No focal deficit present.     Mental Status: He is alert. Mental status is at baseline.  Psychiatric:     Comments: Anxious and upset.       Arrived for home visit for Levie who was alert and oriented seated in his home reporting he has been feeling "sick" over the last few days. When asked to describe the symptoms he reports he has just felt weak, tired, heart racing and scared. I asked Raegan if  he has been feeling dizzy or light headed and he reports yes he has been when he stands up. Rapheal stood multiple times to go back and fourth to the restroom during our visit and I did not witness any balance issues related to his complaint. Jeovany did not appear short of breath while walking. Vitals were obtained and are as noted in report. Javad is supposed to be having [facility] services be assigned to him, they have not reached out to him yet. I will call and assess. Leory and I talked a lot about his anxieties and worries and ways he can cope with them by seeking out a community group to get connected to, he agreed with same. I will attempt at helping him with this. I reviewed medications and confirmed pill box filling same. Lung sounds clear with no JVD. Delando agreed to visit in one week. No upcoming visits noted. Home visit complete. I will see Jalon in one week.   Refills: Pantoprazole  Inspra    Future Appointments  Date Time Provider [place]  06/14/2020  9:40 AM Larey Dresser, MD [facility] None     ACTION: Home visit completed Next visit planned for one week

## 2020-05-22 ENCOUNTER — Telehealth (HOSPITAL_COMMUNITY): Payer: Self-pay | Admitting: Licensed Clinical Social Worker

## 2020-05-22 NOTE — Telephone Encounter (Signed)
Community Paramedic informed CSW that pt had gotten a 30 day lease termination from his apartment site due to not completing re certification paperwork.  CSW had assisted in turning in re certification paperwork for [employer] and was able to confirm with pt Housing authority case worker, Scheryl Darter, that they had received everything they needed and he had been re certified.  Apt complex apparently needing additional paperwork.  CSW called the management company and left message with his Secondary school teacher- also called the property site and left message on their VM as well to determine what next steps needed to be taken to avoid rent being increased.  Will continue to follow and assist as needed  Jorge Ny, [facility] Desk#: [phone] Cell#: [phone]

## 2020-05-23 ENCOUNTER — Telehealth (HOSPITAL_COMMUNITY): Payer: Self-pay | Admitting: Licensed Clinical Social Worker

## 2020-05-23 NOTE — Telephone Encounter (Signed)
CSW received return call from [place] with [employer].  Janett Billow states that notice of lease termination was sent to patient automatically by central office but that his individual site has everything needed to complete the re certification and is just waiting to be officially processed.  Some delay due to staffing but Janett Billow will be going to site next week and will plan to have it resolved by that time- states nothing the pt needs to do at this time.  Community Paramedic also requested help scheduling rides to non [facility] MD appts next week.  CSW scheduled two rides for appts next week- they will call pt to discuss details of transport.  Jorge Ny, LCSW Clinical Social Worker [facility] Desk#: [phone] Cell#: [phone]

## 2020-05-28 ENCOUNTER — Other Ambulatory Visit (HOSPITAL_COMMUNITY): Payer: Self-pay

## 2020-05-28 NOTE — Progress Notes (Signed)
Paramedicine Encounter    Patient ID: Charles Gray, male    DOB: 07/25/1955, 65 y.o.   MRN: [id number]   Patient Care Team: Enid Skeens., MD as PCP - General (Family Medicine) Berniece Salines, DO as PCP - Cardiology (Cardiology)  Patient Active Problem List   Diagnosis Date Noted  . Atrial fibrillation with rapid ventricular response ([place]) 03/24/2020  . Acute on chronic right heart failure ([place]) 01/27/2020  . Acute respiratory failure with hypoxia ([place])   . Fatty liver disease, nonalcoholic   . Shock ([place])   . Personal history of DVT (deep vein thrombosis) 12/26/2019  . Chronic anticoagulation 12/26/2019  . Palpitations 12/26/2019  . Atrial fibrillation ([place]) 12/23/2019  . Heart failure, systolic, acute ([name]) 67/67/2094  . Acute on chronic congestive heart failure ([place])   . Acute on chronic diastolic CHF (congestive heart failure) ([place]) 08/23/2019  . Redness and swelling of lower leg 08/23/2019  . Chest pain 08/23/2019  . Lactic acidosis 08/23/2019  . History of pulmonary embolism 08/12/2019  . Atrial tachycardia ([place]) 08/12/2019  . Occasional tremors 08/12/2019  . OSA on CPAP 08/12/2019  . Acute on chronic right-sided heart failure ([place]) 08/06/2019  . Pulmonary embolism ([place]) 06/08/2019  . Normocytic anemia 06/08/2019  . Pulmonary emboli ([place]) 06/08/2019  . Chronic heart failure with preserved ejection fraction ([place]) 05/30/2019  . Class 2 severe obesity due to excess calories with serious comorbidity and body mass index (BMI) of 39.0 to 39.9 in adult ([place]) 05/30/2019  . Morbid obesity with BMI of 40.0-44.9, adult ([place]) 05/30/2019  . Hyponatremia   . Benign essential HTN   . Drug induced constipation   . Peripheral edema   . Hypotension due to drugs   . Hypoalbuminemia due to protein-calorie malnutrition ([place])   . Hypokalemia   . Thrombocytopenia ([place])   . Anemia of chronic disease   . Cirrhosis of liver without ascites ([place])   . Chronic pain syndrome   . Debility  03/13/2019  . Portal hypertensive gastropathy ([place]) 03/06/2019  . Dyspnea   . GIB (gastrointestinal bleeding) 03/04/2019  . Mixed hyperlipidemia 01/24/2019  . Insomnia 01/24/2019  . Hyperlipidemia 01/24/2019  . Hypertension 01/24/2019  . Lumbar disc herniation 01/24/2019    Current Outpatient Medications:  .  acetaminophen (TYLENOL) 500 MG tablet, Take 1,000 mg by mouth every 6 (six) hours as needed for moderate pain or headache., Disp: , Rfl:  .  apixaban (ELIQUIS) 5 MG TABS tablet, Take 1 tablet (5 mg total) by mouth 2 (two) times daily., Disp: 60 tablet, Rfl: 0 .  bumetanide (BUMEX) 1 MG tablet, Take 4 tablets (4 mg total) by mouth daily with breakfast AND 3 tablets (3 mg total) every evening., Disp: 180 tablet, Rfl: 3 .  Buprenorphine HCl-Naloxone HCl 8-2 MG FILM, Place 1 tablet under the tongue in the morning and at bedtime. , Disp: , Rfl:  .  empagliflozin (JARDIANCE) 10 MG TABS tablet, Take 1 tablet (10 mg total) by mouth daily before breakfast., Disp: 30 tablet, Rfl: 3 .  eplerenone (INSPRA) 50 MG tablet, Take 2 tablets (100 mg total) by mouth daily., Disp: 60 tablet, Rfl: 3 .  gabapentin (NEURONTIN) 800 MG tablet, Take 1 tablet (800 mg total) by mouth 3 (three) times daily., Disp: 90 tablet, Rfl: 1 .  metolazone (ZAROXOLYN) 2.5 MG tablet, Take 1 tablet (2.5 mg total) by mouth 2 (two) times a week. Tuesdays and Fridays with extra KCL 16mq (Patient not taking: Reported on 05/21/2020), Disp: 90  tablet, Rfl: 3 .  metoprolol succinate (TOPROL XL) 25 MG 24 hr tablet, Take 1 tablet (25 mg total) by mouth daily., Disp: 30 tablet, Rfl: 3 .  ondansetron (ZOFRAN ODT) 4 MG disintegrating tablet, Take 1 tablet (4 mg total) by mouth every 8 (eight) hours as needed for nausea or vomiting. (Patient not taking: Reported on 05/21/2020), Disp: 20 tablet, Rfl: 0 .  pantoprazole (PROTONIX) 40 MG tablet, Take 40 mg by mouth 2 (two) times daily., Disp: , Rfl:  .  polyethylene glycol (MIRALAX / GLYCOLAX) 17 g  packet, Take 17 g by mouth daily. (Patient not taking: No sig reported), Disp: 14 each, Rfl: 0 .  potassium chloride SA (KLOR-CON) 20 MEQ tablet, Take 40 mEq by mouth See admin instructions. Take 40 meq twice daily on Sun, Mon, Wed, Thurs, and Sat. Take 40 meq 3 times daily on Tues and Fri (metolazone days), Disp: , Rfl:  .  pravastatin (PRAVACHOL) 20 MG tablet, Take 1 tablet (20 mg total) by mouth daily., Disp: 30 tablet, Rfl: 1 .  sodium chloride (OCEAN) 0.65 % SOLN nasal spray, Place 1 spray into both nostrils as needed for congestion., Disp: , Rfl:  .  VENTOLIN HFA 108 (90 Base) MCG/ACT inhaler, Inhale 1 puff into the lungs every 4 (four) hours as needed for shortness of breath. (Patient taking differently: Inhale 1-2 puffs into the lungs every 4 (four) hours as needed for shortness of breath.), Disp: 6.7 g, Rfl: 1 .  zolpidem (AMBIEN) 10 MG tablet, Take 10 mg by mouth at bedtime as needed for sleep.  (Patient not taking: Reported on 05/21/2020), Disp: , Rfl:  Allergies  Allergen Reactions  . Penicillins     Broke out in convulsions as a child  Did it involve swelling of the face/tongue/throat, SOB, or low BP? N/A Did it involve sudden or severe rash/hives, skin peeling, or any reaction on the inside of your mouth or nose? N/A Did you need to seek medical attention at a hospital or doctor's office?N/A When did it last happen?N/A If all above answers are "NO", may proceed with cephalosporin use.     Social History   Socioeconomic History  . Marital status: Single    Spouse name: Not on file  . Number of children: Not on file  . Years of education: Not on file  . Highest education level: Not on file  Occupational History  . Not on file  Tobacco Use  . Smoking status: Never Smoker  . Smokeless tobacco: Never Used  Vaping Use  . Vaping Use: Never used  Substance and Sexual Activity  . Alcohol use: Not Currently  . Drug use: Not Currently    Types: Marijuana    Comment: Smoked for  30 years  . Sexual activity: Not on file  Other Topics Concern  . Not on file  Social History Narrative  . Not on file   Social Determinants of Health   Financial Resource Strain: Low Risk   . Difficulty of Paying Living Expenses: Not very hard  Food Insecurity: No Food Insecurity  . Worried About Charity fundraiser in the Last Year: Never true  . Ran Out of Food in the Last Year: Never true  Transportation Needs: Unmet Transportation Needs  . Lack of Transportation (Medical): Yes  . Lack of Transportation (Non-Medical): Yes  Physical Activity: Not on file  Stress: Not on file  Social Connections: Not on file  Intimate Partner Violence: Not on file  Physical Exam Vitals reviewed.  Constitutional:      Appearance: Normal appearance. He is normal weight.  HENT:     Head: Normocephalic.     Nose: Nose normal. No congestion or rhinorrhea.     Mouth/Throat:     Mouth: Mucous membranes are moist.     Pharynx: Oropharynx is clear.  Eyes:     Conjunctiva/sclera: Conjunctivae normal.     Pupils: Pupils are equal, round, and reactive to light.  Cardiovascular:     Rate and Rhythm: Normal rate and regular rhythm.     Pulses: Normal pulses.     Heart sounds: Normal heart sounds.  Pulmonary:     Effort: Pulmonary effort is normal.     Breath sounds: Normal breath sounds.  Abdominal:     General: Abdomen is flat. There is no distension.     Palpations: Abdomen is soft.  Musculoskeletal:        General: Swelling present. Normal range of motion.     Cervical back: Normal range of motion.     Right lower leg: Edema present.     Left lower leg: Edema present.  Skin:    General: Skin is warm and dry.     Capillary Refill: Capillary refill takes less than 2 seconds.  Neurological:     General: No focal deficit present.     Mental Status: He is alert. Mental status is at baseline.  Psychiatric:        Mood and Affect: Mood normal.        Behavior: Behavior normal.         Thought Content: Thought content normal.        Judgment: Judgment normal.    Arrived for home visit for Charles Gray who was alert and oriented seated on his couch reporting to be feeling good today. Charles Gray saw his PCP earlier today and reports his PCP was able to put in an order for a hospital bed. Charles Gray reports he still hasn't heard back from [facility] services. I called [place] who reports the patient was rejected from [facility] and [facility] and they sent in referral to [facility] in [place] but I spoke to them and they no longer offer PCS services, but suggested [facility] in [place]. [place] stated for the patient to call back on his own with the name, number and his Medicaid ID and they would send a referral to this group for clearance for PCS services.   [place]  [phone]   I will assist patient in following up with same. Vitals were obtained and assessment completed. Charles Gray has his normal swelling in his legs but no more than usual. Charles Gray reports intermittent episodes of increased heart rate and shortness of breath but at rest it gets better after a short while. Lung sounds clear and equal. Medications were confirmed and verified. Pill box filled accordingly. Charles Gray was reminded of upcoming appointments and he agreed with plan. [facility] transportation set up on the 10th. Ride confirmed. They will call patient tomorrow with pick up time for Thursdays appointment.   Charles Gray reports to me that since taking the Inspra he has had internal itching sensation in his breasts bilaterally. I will report same to PA.   Charles Gray agreed with home visit in one week. I will continue to assist Charles Gray as needed. Home visit complete.   Refills:  Inspra Bumex Pantoprazole       Future Appointments  Date  Time Provider [place]  06/14/2020  9:40 AM Larey Dresser, MD [facility] None     ACTION: Home visit completed Next visit planned for one  week

## 2020-06-03 ENCOUNTER — Telehealth (HOSPITAL_COMMUNITY): Payer: Self-pay | Admitting: Licensed Clinical Social Worker

## 2020-06-03 ENCOUNTER — Telehealth (HOSPITAL_COMMUNITY): Payer: Self-pay

## 2020-06-03 NOTE — Telephone Encounter (Signed)
CSW informed by [employer] that pt needs ride to lab appt at PCP office on Wednesday.  CSW set up ride through [employer] for appt.  Will continue to follow and assist as needed  Jorge Ny, [facility] Desk#: [phone] Cell#: [phone]

## 2020-06-03 NOTE — Telephone Encounter (Signed)
Charles Gray reached out to me in reference to concerns of UTI type symptoms with dark urine, painful urination and flank pain. I contacted PCP and set up appointment for [facility] at [facility] at Dr. Marrian Salvage office. I will have transportation set up as well. Call complete.

## 2020-06-06 ENCOUNTER — Ambulatory Visit: Payer: Medicare HMO | Admitting: Cardiology

## 2020-06-06 ENCOUNTER — Telehealth (HOSPITAL_COMMUNITY): Payer: Self-pay

## 2020-06-06 NOTE — Telephone Encounter (Signed)
Please try to get him into APP clinic sooner. Or I can see him one day I am in the hospital.

## 2020-06-06 NOTE — Telephone Encounter (Signed)
Patient called stating that he has been experiencing swelling in his feet and ankles,skin discoloration(red &purple), loosing feeling in his feet,bilateral foot pain(hurts to stand),blisters and states that all of these symptoms are radiating up his legs. He reports that his symptoms have gradually gotten worse over the past week. I advised patient that he needed to be seen in the ER but patient refuses to go due to long wait times. Patient does have a  pending appointment with our office next Friday(3/4). Please advise.

## 2020-06-07 NOTE — Telephone Encounter (Signed)
Calld pt to offer appt on Tuesday with Dr.McLean no answer.

## 2020-06-09 ENCOUNTER — Inpatient Hospital Stay (HOSPITAL_COMMUNITY)
Admission: EM | Admit: 2020-06-09 | Discharge: 2020-06-13 | DRG: 291 | Disposition: A | Discharge status: Home Health | Payer: Medicare HMO | Attending: Internal Medicine | Admitting: Internal Medicine

## 2020-06-09 ENCOUNTER — Other Ambulatory Visit: Payer: Self-pay

## 2020-06-09 ENCOUNTER — Emergency Department (HOSPITAL_COMMUNITY): Payer: Medicare HMO

## 2020-06-09 DIAGNOSIS — Z823 Family history of stroke: Secondary | ICD-10-CM

## 2020-06-09 DIAGNOSIS — D6959 Other secondary thrombocytopenia: Secondary | ICD-10-CM | POA: Diagnosis present

## 2020-06-09 DIAGNOSIS — Z79899 Other long term (current) drug therapy: Secondary | ICD-10-CM

## 2020-06-09 DIAGNOSIS — Z6838 Body mass index (BMI) 38.0-38.9, adult: Secondary | ICD-10-CM

## 2020-06-09 DIAGNOSIS — G4700 Insomnia, unspecified: Secondary | ICD-10-CM | POA: Diagnosis present

## 2020-06-09 DIAGNOSIS — I5032 Chronic diastolic (congestive) heart failure: Secondary | ICD-10-CM | POA: Diagnosis present

## 2020-06-09 DIAGNOSIS — I10 Essential (primary) hypertension: Secondary | ICD-10-CM

## 2020-06-09 DIAGNOSIS — Z83438 Family history of other disorder of lipoprotein metabolism and other lipidemia: Secondary | ICD-10-CM

## 2020-06-09 DIAGNOSIS — F419 Anxiety disorder, unspecified: Secondary | ICD-10-CM | POA: Diagnosis present

## 2020-06-09 DIAGNOSIS — F32A Depression, unspecified: Secondary | ICD-10-CM | POA: Diagnosis present

## 2020-06-09 DIAGNOSIS — K7460 Unspecified cirrhosis of liver: Secondary | ICD-10-CM | POA: Diagnosis present

## 2020-06-09 DIAGNOSIS — J449 Chronic obstructive pulmonary disease, unspecified: Secondary | ICD-10-CM | POA: Diagnosis present

## 2020-06-09 DIAGNOSIS — I2510 Atherosclerotic heart disease of native coronary artery without angina pectoris: Secondary | ICD-10-CM | POA: Diagnosis present

## 2020-06-09 DIAGNOSIS — G4733 Obstructive sleep apnea (adult) (pediatric): Secondary | ICD-10-CM | POA: Diagnosis present

## 2020-06-09 DIAGNOSIS — I110 Hypertensive heart disease with heart failure: Principal | ICD-10-CM | POA: Diagnosis present

## 2020-06-09 DIAGNOSIS — I5033 Acute on chronic diastolic (congestive) heart failure: Secondary | ICD-10-CM | POA: Diagnosis present

## 2020-06-09 DIAGNOSIS — Z86718 Personal history of other venous thrombosis and embolism: Secondary | ICD-10-CM

## 2020-06-09 DIAGNOSIS — Z880 Allergy status to penicillin: Secondary | ICD-10-CM

## 2020-06-09 DIAGNOSIS — Z8249 Family history of ischemic heart disease and other diseases of the circulatory system: Secondary | ICD-10-CM

## 2020-06-09 DIAGNOSIS — R609 Edema, unspecified: Secondary | ICD-10-CM

## 2020-06-09 DIAGNOSIS — Z9119 Patient's noncompliance with other medical treatment and regimen: Secondary | ICD-10-CM

## 2020-06-09 DIAGNOSIS — Z7901 Long term (current) use of anticoagulants: Secondary | ICD-10-CM

## 2020-06-09 DIAGNOSIS — Z20822 Contact with and (suspected) exposure to covid-19: Secondary | ICD-10-CM | POA: Diagnosis present

## 2020-06-09 DIAGNOSIS — E876 Hypokalemia: Secondary | ICD-10-CM | POA: Diagnosis present

## 2020-06-09 DIAGNOSIS — I50811 Acute right heart failure: Secondary | ICD-10-CM | POA: Diagnosis not present

## 2020-06-09 DIAGNOSIS — K5900 Constipation, unspecified: Secondary | ICD-10-CM | POA: Diagnosis present

## 2020-06-09 DIAGNOSIS — Z6841 Body Mass Index (BMI) 40.0 and over, adult: Secondary | ICD-10-CM

## 2020-06-09 DIAGNOSIS — Z86711 Personal history of pulmonary embolism: Secondary | ICD-10-CM | POA: Diagnosis present

## 2020-06-09 DIAGNOSIS — K219 Gastro-esophageal reflux disease without esophagitis: Secondary | ICD-10-CM | POA: Diagnosis present

## 2020-06-09 DIAGNOSIS — I480 Paroxysmal atrial fibrillation: Secondary | ICD-10-CM | POA: Diagnosis present

## 2020-06-09 DIAGNOSIS — I50813 Acute on chronic right heart failure: Secondary | ICD-10-CM | POA: Diagnosis present

## 2020-06-09 DIAGNOSIS — G894 Chronic pain syndrome: Secondary | ICD-10-CM | POA: Diagnosis present

## 2020-06-09 DIAGNOSIS — E782 Mixed hyperlipidemia: Secondary | ICD-10-CM | POA: Diagnosis present

## 2020-06-09 LAB — COMPREHENSIVE METABOLIC PANEL
ALT: 25 U/L (ref 0–44)
AST: 55 U/L — ABNORMAL HIGH (ref 15–41)
Albumin: 3.6 g/dL (ref 3.5–5.0)
Alkaline Phosphatase: 80 U/L (ref 38–126)
Anion gap: 14 (ref 5–15)
BUN: 15 mg/dL (ref 8–23)
CO2: 25 mmol/L (ref 22–32)
Calcium: 9 mg/dL (ref 8.9–10.3)
Chloride: 95 mmol/L — ABNORMAL LOW (ref 98–111)
Creatinine, Ser: 0.92 mg/dL (ref 0.61–1.24)
GFR, Estimated: >60 mL/min (ref >60)
Glucose, Bld: 106 mg/dL — ABNORMAL HIGH (ref 70–99)
Potassium: 4.9 mmol/L (ref 3.5–5.1)
Sodium: 134 mmol/L — ABNORMAL LOW (ref 135–145)
Total Bilirubin: 1.1 mg/dL (ref 0.3–1.2)
Total Protein: 7.2 g/dL (ref 6.5–8.1)

## 2020-06-09 LAB — CBC
HCT: 44.6 % (ref 39.0–52.0)
Hemoglobin: 12.8 g/dL — ABNORMAL LOW (ref 13.0–17.0)
MCH: 24.7 pg — ABNORMAL LOW (ref 26.0–34.0)
MCHC: 28.7 g/dL — ABNORMAL LOW (ref 30.0–36.0)
MCV: 86.1 fL (ref 80.0–100.0)
Platelets: 46 10*3/uL — ABNORMAL LOW (ref 150–400)
RBC: 5.18 MIL/uL (ref 4.22–5.81)
RDW: 15.9 % — ABNORMAL HIGH (ref 11.5–15.5)
WBC: 7.2 10*3/uL (ref 4.0–10.5)
nRBC: 0 % (ref 0.0–0.2)

## 2020-06-09 LAB — TROPONIN I (HIGH SENSITIVITY): Troponin I (High Sensitivity): 7 ng/L (ref <18)

## 2020-06-09 MED ORDER — FUROSEMIDE 10 MG/ML IJ SOLN
40.0000 mg | INTRAMUSCULAR | Status: AC
  Administered 2020-06-09: 40 mg via INTRAVENOUS
  Filled 2020-06-09: qty 4

## 2020-06-09 NOTE — ED Provider Notes (Signed)
[facility] EMERGENCY DEPARTMENT Provider Note   CSN: [id number] Arrival date & time: 06/09/20  2053     History Chief Complaint  Patient presents with  . Chest Pain    Esteban Kobashigawa is a 65 y.o. male.  HPI   This patient is a 65 year old male, he has a known history of congestive heart failure, right-sided, history of atrial fibrillation, hypertension, he is not a diabetic.  He also has a known history of cirrhosis but is never had ascites, he follows with local cardiology here and arrives by ambulance transport because of increasing chest pain shortness of breath which started in the last couple of days.  He also notes that his lower extremities have become extremely swollen and is even having some blistering lesions occurring at the ankles because of the severe edema.  This is despite taking his diuretics.  His vital signs have not shown shown any fevers or tachycardia, he is not having any coughing, he has no diarrhea or dysuria, no back pain, he does have some chest pain but reports he has had a couple of heart catheterizations recently and told that he did not have any intervening bowel obstructive lesions  His most recent heart catheterization from May 10, 2020 showed no obstructive coronary disease, he had elevated right greater than left heart filling pressures suggestive of primarily right ventricular failure and had his bumetanide increased to 4 mg in the morning and 3 mg at night.  Echocardiogram from December 2021 showed ejection fraction of 60 to 65% with grade 2 diastolic dysfunction.  Past Medical History:  Diagnosis Date  . Acute on chronic congestive heart failure ([place])   . Acute on chronic diastolic CHF (congestive heart failure) ([place]) 08/23/2019  . Acute on chronic right-sided heart failure ([place]) 08/06/2019  . Acute respiratory failure with hypoxia ([place])   . Anemia of chronic disease   . Atrial fibrillation ([place]) 12/23/2019  . Atrial  tachycardia ([place]) 08/12/2019  . Benign essential HTN   . Chest pain 08/23/2019  . Chronic anticoagulation 12/26/2019  . Chronic heart failure with preserved EF 05/30/2019   Echo 07/2019: EF 55-60, GR 1 DD  . Chronic pain syndrome   . Cirrhosis of liver without ascites ([place])   . Class 2 severe obesity due to excess calories with serious comorbidity and body mass index (BMI) of 39.0 to 39.9 in adult ([place]) 05/30/2019  . Debility 03/13/2019  . Drug induced constipation   . Dyspnea   . Fatty liver disease, nonalcoholic   . GIB (gastrointestinal bleeding) 03/04/2019  . Heart failure, systolic, acute ([place]) 5/62/1308  . History of pulmonary embolism 08/12/2019  . Hyperlipidemia 01/24/2019  . Hypertension 01/24/2019  . Hypoalbuminemia due to protein-calorie malnutrition ([place])   . Hypokalemia   . Hyponatremia   . Hypotension due to drugs   . Insomnia 01/24/2019  . Lactic acidosis 08/23/2019  . Lumbar disc herniation 01/24/2019  . Mixed hyperlipidemia 01/24/2019  . Morbid obesity with BMI of 40.0-44.9, adult ([place]) 05/30/2019  . Normocytic anemia 06/08/2019  . Occasional tremors 08/12/2019  . OSA (obstructive sleep apnea) 08/12/2019  . Palpitations 12/26/2019  . Peripheral edema   . Personal history of DVT (deep vein thrombosis) 12/26/2019  . Portal hypertensive gastropathy ([place]) 03/06/2019   Seen on EGD 03/06/2019  . Pulmonary emboli ([place]) 06/08/2019  . Pulmonary embolism ([place]) 06/08/2019  . Redness and swelling of lower leg 08/23/2019  . Shock ([place])   . Thrombocytopenia ([place])  Patient Active Problem List   Diagnosis Date Noted  . Atrial fibrillation with rapid ventricular response ([place]) 03/24/2020  . Acute on chronic right heart failure ([place]) 01/27/2020  . Acute respiratory failure with hypoxia ([place])   . Fatty liver disease, nonalcoholic   . Shock ([place])   . Personal history of DVT (deep vein thrombosis) 12/26/2019  . Chronic anticoagulation 12/26/2019  . Palpitations 12/26/2019  . Atrial  fibrillation ([place]) 12/23/2019  . Heart failure, systolic, acute ([place]) 97/41/6384  . Acute on chronic congestive heart failure ([place])   . Acute on chronic diastolic CHF (congestive heart failure) ([place]) 08/23/2019  . Redness and swelling of lower leg 08/23/2019  . Chest pain 08/23/2019  . Lactic acidosis 08/23/2019  . History of pulmonary embolism 08/12/2019  . Atrial tachycardia ([place]) 08/12/2019  . Occasional tremors 08/12/2019  . OSA on CPAP 08/12/2019  . Acute on chronic right-sided heart failure ([place]) 08/06/2019  . Pulmonary embolism ([place]) 06/08/2019  . Normocytic anemia 06/08/2019  . Pulmonary emboli ([place]) 06/08/2019  . Chronic heart failure with preserved ejection fraction ([place]) 05/30/2019  . Class 2 severe obesity due to excess calories with serious comorbidity and body mass index (BMI) of 39.0 to 39.9 in adult ([place]) 05/30/2019  . Morbid obesity with BMI of 40.0-44.9, adult ([place]) 05/30/2019  . Hyponatremia   . Benign essential HTN   . Drug induced constipation   . Peripheral edema   . Hypotension due to drugs   . Hypoalbuminemia due to protein-calorie malnutrition ([place])   . Hypokalemia   . Thrombocytopenia ([place])   . Anemia of chronic disease   . Cirrhosis of liver without ascites ([place])   . Chronic pain syndrome   . Debility 03/13/2019  . Portal hypertensive gastropathy ([place]) 03/06/2019  . Dyspnea   . GIB (gastrointestinal bleeding) 03/04/2019  . Mixed hyperlipidemia 01/24/2019  . Insomnia 01/24/2019  . Hyperlipidemia 01/24/2019  . Hypertension 01/24/2019  . Lumbar disc herniation 01/24/2019    Past Surgical History:  Procedure Laterality Date  . ESOPHAGOGASTRODUODENOSCOPY (EGD) WITH PROPOFOL N/A 03/05/2019   Procedure: ESOPHAGOGASTRODUODENOSCOPY (EGD) WITH PROPOFOL;  Surgeon: Doran Stabler, MD;  Location: [facility];  Service: Gastroenterology;  Laterality: N/A;  Large Blood Clot Removed  . ESOPHAGOGASTRODUODENOSCOPY (EGD) WITH PROPOFOL N/A 03/06/2019    Procedure: ESOPHAGOGASTRODUODENOSCOPY (EGD) WITH PROPOFOL;  Surgeon: Thornton Park, MD;  Location: [facility];  Service: Gastroenterology;  Laterality: N/A;  . HEMORROIDECTOMY    . RIGHT HEART CATH N/A 08/09/2019   Procedure: RIGHT HEART CATH;  Surgeon: Larey Dresser, MD;  Location: [facility];  Service: Cardiovascular;  Laterality: N/A;  . RIGHT/LEFT HEART CATH AND CORONARY ANGIOGRAPHY N/A 05/10/2020   Procedure: RIGHT/LEFT HEART CATH AND CORONARY ANGIOGRAPHY;  Surgeon: Larey Dresser, MD;  Location: [facility];  Service: Cardiovascular;  Laterality: N/A;       Family History  Problem Relation Age of Onset  . Hyperlipidemia Mother   . Hypertension Mother   . Stroke Mother   . CAD Maternal Grandmother   . CAD Maternal Grandfather     Social History   Tobacco Use  . Smoking status: Never Smoker  . Smokeless tobacco: Never Used  Vaping Use  . Vaping Use: Never used  Substance Use Topics  . Alcohol use: Not Currently  . Drug use: Not Currently    Types: Marijuana    Comment: Smoked for 30 years    Home Medications Prior to Admission medications   Medication Sig  Start Date End Date Taking? Authorizing Provider  acetaminophen (TYLENOL) 500 MG tablet Take 1,000 mg by mouth every 6 (six) hours as needed for moderate pain or headache.    [provider]  apixaban (ELIQUIS) 5 MG TABS tablet Take 1 tablet (5 mg total) by mouth 2 (two) times daily. 08/12/19   Strader, Fransisco Hertz, PA-C  bumetanide (BUMEX) 1 MG tablet Take 4 tablets (4 mg total) by mouth daily with breakfast AND 3 tablets (3 mg total) every evening. 05/10/20   Larey Dresser, MD  Buprenorphine HCl-Naloxone HCl 8-2 MG FILM Place 1 tablet under the tongue in the morning and at bedtime.     [provider]  empagliflozin (JARDIANCE) 10 MG TABS tablet Take 1 tablet (10 mg total) by mouth daily before breakfast. Patient not taking: Reported on 05/28/2020 04/11/20   Larey Dresser, MD   eplerenone (INSPRA) 50 MG tablet Take 2 tablets (100 mg total) by mouth daily. 02/27/20   Clegg, [name] D, NP  gabapentin (NEURONTIN) 800 MG tablet Take 1 tablet (800 mg total) by mouth 3 (three) times daily. 03/23/19   Angiulli, Lavon Paganini, PA-C  metolazone (ZAROXOLYN) 2.5 MG tablet Take 1 tablet (2.5 mg total) by mouth 2 (two) times a week. Tuesdays and Fridays with extra KCL 69mq Patient not taking: No sig reported 04/11/20   MLarey Dresser MD  metoprolol succinate (TOPROL XL) 25 MG 24 hr tablet Take 1 tablet (25 mg total) by mouth daily. 04/11/20   MLarey Dresser MD  ondansetron (ZOFRAN ODT) 4 MG disintegrating tablet Take 1 tablet (4 mg total) by mouth every 8 (eight) hours as needed for nausea or vomiting. Patient not taking: No sig reported 04/24/20   MLarey Dresser MD  pantoprazole (PROTONIX) 40 MG tablet Take 40 mg by mouth 2 (two) times daily.    [provider]  polyethylene glycol (MIRALAX / GLYCOLAX) 17 g packet Take 17 g by mouth daily. Patient not taking: No sig reported 03/23/19   Angiulli, DLavon Paganini PA-C  potassium chloride SA (KLOR-CON) 20 MEQ tablet Take 40 mEq by mouth See admin instructions. Take 40 meq twice daily on Sun, Mon, Wed, Thurs, and Sat. Take 40 meq 3 times daily on Tues and Fri (metolazone days)    [provider]  pravastatin (PRAVACHOL) 20 MG tablet Take 1 tablet (20 mg total) by mouth daily. 03/23/19   Angiulli, DLavon Paganini PA-C  sodium chloride (OCEAN) 0.65 % SOLN nasal spray Place 1 spray into both nostrils as needed for congestion.    [provider]  VENTOLIN HFA 108 (90 Base) MCG/ACT inhaler Inhale 1 puff into the lungs every 4 (four) hours as needed for shortness of breath. Patient taking differently: Inhale 1-2 puffs into the lungs every 4 (four) hours as needed for shortness of breath. 03/23/19   Angiulli, DLavon Paganini PA-C  zolpidem (AMBIEN) 10 MG tablet Take 10 mg by mouth at bedtime as needed for sleep.  Patient not taking: No  sig reported 05/19/19   [provider]    Allergies    Penicillins  Review of Systems   Review of Systems  All other systems reviewed and are negative.   Physical Exam Updated Vital Signs BP 118/77   Pulse 72   Temp 99.1 F (37.3 C) (Oral)   Resp 15   Ht 2.083 m (6' 10" )   Wt (!) 165.6 kg   SpO2 98%   BMI 38.17 kg/m   Physical  Exam Vitals and nursing note reviewed.  Constitutional:      General: He is not in acute distress.    Appearance: He is well-developed and well-nourished.  HENT:     Head: Normocephalic and atraumatic.     Mouth/Throat:     Mouth: Oropharynx is clear and moist.     Pharynx: No oropharyngeal exudate.  Eyes:     General: No scleral icterus.       Right eye: No discharge.        Left eye: No discharge.     Extraocular Movements: EOM normal.     Conjunctiva/sclera: Conjunctivae normal.     Pupils: Pupils are equal, round, and reactive to light.  Neck:     Thyroid: No thyromegaly.     Vascular: No JVD.  Cardiovascular:     Rate and Rhythm: Normal rate and regular rhythm.     Pulses: Intact distal pulses.     Heart sounds: Normal heart sounds. No murmur heard. No friction rub. No gallop.   Pulmonary:     Effort: Pulmonary effort is normal. No respiratory distress.     Breath sounds: Normal breath sounds. No wheezing or rales.  Abdominal:     General: Bowel sounds are normal. There is no distension.     Palpations: Abdomen is soft. There is no mass.     Tenderness: There is no abdominal tenderness.     Comments: Abdomen is mildly distended, there is no anasarca no fluid wave.  Musculoskeletal:        General: No tenderness. Normal range of motion.     Cervical back: Normal range of motion and neck supple.     Right lower leg: Edema present.     Left lower leg: Edema present.     Comments: Significant and severe bilateral lower extremity edema which is symmetrical, there are some bullous lesions found on the ankle on the left side.   No erythema or warmth, no cellulitis or induration  Lymphadenopathy:     Cervical: No cervical adenopathy.  Skin:    General: Skin is warm and dry.     Findings: No erythema or rash.  Neurological:     Mental Status: He is alert.     Coordination: Coordination normal.  Psychiatric:        Mood and Affect: Mood and affect normal.        Behavior: Behavior normal.     ED Results / Procedures / Treatments   Labs (all labs ordered are listed, but only abnormal results are displayed) Labs Reviewed - No data to display  EKG None  Radiology No results found.  Procedures Procedures   Medications Ordered in ED Medications - No data to display  ED Course  I have reviewed the triage vital signs and the nursing notes.  Pertinent labs & imaging results that were available during my care of the patient were reviewed by me and considered in my medical decision making (see chart for details).    MDM Rules/Calculators/A&P                          The patient does appear edematous in his legs, he feels short of breath but his oxygen levels are 98% on room air and he is not tachypneic nor is he tachycardic.  His blood pressure is 118/77 and his temperature is 99.1.  Chest x-ray to look for pulmonary edema, I suspect given his right-sided  heart failure that it would be more lower extremities, he feels like he does not fit into his clothes or shoes because of the severe edema.  We will give Lasix, anticipate admission for significant fluid overload  Pt improved with lasix - still feeling very swollen - still with SOB and CP - at this time - it is likely the CHF _ labs have still not returned as the patient had difficult IV accessa nd there was a delay in the labs being sent.  Plan is for admission for ongoing diuresis as he has been takiing lasix at home but continuing to swell.  Dr. Christy Gentles will assume care at change of shift  Final Clinical Impression(s) / ED Diagnoses Final diagnoses:   None    Rx / DC Orders ED Discharge Orders    None       Noemi Chapel, MD 06/11/20 1027

## 2020-06-09 NOTE — ED Triage Notes (Signed)
Pt arrived via ems from home due to chest pain. Pt states pain has been on and off since noon. Pt states pain has been constant for the last 2 hrs. Pt described pain as sharp pain on the left side that radiates down his left arm. Complaints of nausea w/o vomiting

## 2020-06-10 ENCOUNTER — Encounter (HOSPITAL_COMMUNITY): Payer: Self-pay | Admitting: Internal Medicine

## 2020-06-10 DIAGNOSIS — D6959 Other secondary thrombocytopenia: Secondary | ICD-10-CM | POA: Diagnosis present

## 2020-06-10 DIAGNOSIS — E782 Mixed hyperlipidemia: Secondary | ICD-10-CM

## 2020-06-10 DIAGNOSIS — Z880 Allergy status to penicillin: Secondary | ICD-10-CM | POA: Diagnosis not present

## 2020-06-10 DIAGNOSIS — I50813 Acute on chronic right heart failure: Secondary | ICD-10-CM

## 2020-06-10 DIAGNOSIS — Z86718 Personal history of other venous thrombosis and embolism: Secondary | ICD-10-CM | POA: Diagnosis not present

## 2020-06-10 DIAGNOSIS — Z86711 Personal history of pulmonary embolism: Secondary | ICD-10-CM

## 2020-06-10 DIAGNOSIS — Z20822 Contact with and (suspected) exposure to covid-19: Secondary | ICD-10-CM | POA: Diagnosis present

## 2020-06-10 DIAGNOSIS — F32A Depression, unspecified: Secondary | ICD-10-CM | POA: Diagnosis present

## 2020-06-10 DIAGNOSIS — I2510 Atherosclerotic heart disease of native coronary artery without angina pectoris: Secondary | ICD-10-CM | POA: Diagnosis present

## 2020-06-10 DIAGNOSIS — G4733 Obstructive sleep apnea (adult) (pediatric): Secondary | ICD-10-CM | POA: Diagnosis present

## 2020-06-10 DIAGNOSIS — F419 Anxiety disorder, unspecified: Secondary | ICD-10-CM | POA: Diagnosis present

## 2020-06-10 DIAGNOSIS — I110 Hypertensive heart disease with heart failure: Secondary | ICD-10-CM | POA: Diagnosis present

## 2020-06-10 DIAGNOSIS — I5033 Acute on chronic diastolic (congestive) heart failure: Secondary | ICD-10-CM | POA: Diagnosis present

## 2020-06-10 DIAGNOSIS — J449 Chronic obstructive pulmonary disease, unspecified: Secondary | ICD-10-CM | POA: Diagnosis present

## 2020-06-10 DIAGNOSIS — K5900 Constipation, unspecified: Secondary | ICD-10-CM | POA: Diagnosis present

## 2020-06-10 DIAGNOSIS — I480 Paroxysmal atrial fibrillation: Secondary | ICD-10-CM

## 2020-06-10 DIAGNOSIS — K219 Gastro-esophageal reflux disease without esophagitis: Secondary | ICD-10-CM | POA: Diagnosis present

## 2020-06-10 DIAGNOSIS — I50811 Acute right heart failure: Secondary | ICD-10-CM | POA: Diagnosis present

## 2020-06-10 DIAGNOSIS — G4700 Insomnia, unspecified: Secondary | ICD-10-CM | POA: Diagnosis present

## 2020-06-10 DIAGNOSIS — G894 Chronic pain syndrome: Secondary | ICD-10-CM

## 2020-06-10 DIAGNOSIS — Z9119 Patient's noncompliance with other medical treatment and regimen: Secondary | ICD-10-CM | POA: Diagnosis not present

## 2020-06-10 DIAGNOSIS — E876 Hypokalemia: Secondary | ICD-10-CM | POA: Diagnosis present

## 2020-06-10 DIAGNOSIS — Z6841 Body Mass Index (BMI) 40.0 and over, adult: Secondary | ICD-10-CM | POA: Diagnosis not present

## 2020-06-10 DIAGNOSIS — Z7901 Long term (current) use of anticoagulants: Secondary | ICD-10-CM | POA: Diagnosis not present

## 2020-06-10 DIAGNOSIS — K7460 Unspecified cirrhosis of liver: Secondary | ICD-10-CM | POA: Diagnosis present

## 2020-06-10 DIAGNOSIS — I10 Essential (primary) hypertension: Secondary | ICD-10-CM

## 2020-06-10 LAB — BASIC METABOLIC PANEL
Anion gap: 11 (ref 5–15)
BUN: 13 mg/dL (ref 8–23)
CO2: 30 mmol/L (ref 22–32)
Calcium: 9.4 mg/dL (ref 8.9–10.3)
Chloride: 95 mmol/L — ABNORMAL LOW (ref 98–111)
Creatinine, Ser: 0.95 mg/dL (ref 0.61–1.24)
GFR, Estimated: >60 mL/min (ref >60)
Glucose, Bld: 108 mg/dL — ABNORMAL HIGH (ref 70–99)
Potassium: 3.7 mmol/L (ref 3.5–5.1)
Sodium: 136 mmol/L (ref 135–145)

## 2020-06-10 LAB — CBC
HCT: 41.2 % (ref 39.0–52.0)
Hemoglobin: 12.8 g/dL — ABNORMAL LOW (ref 13.0–17.0)
MCH: 25 pg — ABNORMAL LOW (ref 26.0–34.0)
MCHC: 31.1 g/dL (ref 30.0–36.0)
MCV: 80.6 fL (ref 80.0–100.0)
Platelets: 158 10*3/uL (ref 150–400)
RBC: 5.11 MIL/uL (ref 4.22–5.81)
RDW: 16 % — ABNORMAL HIGH (ref 11.5–15.5)
WBC: 8.8 10*3/uL (ref 4.0–10.5)
nRBC: 0 % (ref 0.0–0.2)

## 2020-06-10 LAB — MAGNESIUM: Magnesium: 2.3 mg/dL (ref 1.7–2.4)

## 2020-06-10 LAB — URINALYSIS, COMPLETE (UACMP) WITH MICROSCOPIC
Bacteria, UA: NONE SEEN
Bilirubin Urine: NEGATIVE
Glucose, UA: NEGATIVE mg/dL
Hgb urine dipstick: NEGATIVE
Ketones, ur: NEGATIVE mg/dL
Leukocytes,Ua: NEGATIVE
Nitrite: NEGATIVE
Protein, ur: NEGATIVE mg/dL
Specific Gravity, Urine: 1.01 (ref 1.005–1.030)
pH: 7 (ref 5.0–8.0)

## 2020-06-10 LAB — SARS CORONAVIRUS 2 (TAT 6-24 HRS): SARS Coronavirus 2: NEGATIVE

## 2020-06-10 LAB — HIV ANTIBODY (ROUTINE TESTING W REFLEX): HIV Screen 4th Generation wRfx: NONREACTIVE

## 2020-06-10 LAB — BRAIN NATRIURETIC PEPTIDE: B Natriuretic Peptide: 41.2 pg/mL (ref 0.0–100.0)

## 2020-06-10 MED ORDER — BUPRENORPHINE HCL-NALOXONE HCL 8-2 MG SL SUBL
1.0000 | SUBLINGUAL_TABLET | Freq: Two times a day (BID) | SUBLINGUAL | Status: DC
  Administered 2020-06-10 – 2020-06-13 (×7): 1 via SUBLINGUAL
  Filled 2020-06-10 (×7): qty 1

## 2020-06-10 MED ORDER — METOPROLOL SUCCINATE ER 25 MG PO TB24
25.0000 mg | ORAL_TABLET | Freq: Every day | ORAL | Status: DC
  Administered 2020-06-10: 25 mg via ORAL
  Filled 2020-06-10: qty 1

## 2020-06-10 MED ORDER — SALINE SPRAY 0.65 % NA SOLN
1.0000 | NASAL | Status: DC | PRN
  Filled 2020-06-10: qty 44

## 2020-06-10 MED ORDER — APIXABAN 5 MG PO TABS
5.0000 mg | ORAL_TABLET | Freq: Two times a day (BID) | ORAL | Status: DC
  Administered 2020-06-10 – 2020-06-13 (×6): 5 mg via ORAL
  Filled 2020-06-10 (×6): qty 1

## 2020-06-10 MED ORDER — SODIUM CHLORIDE 0.9% FLUSH: 3.0000 mL | INTRAVENOUS | Status: DC | PRN

## 2020-06-10 MED ORDER — SPIRONOLACTONE 25 MG PO TABS
50.0000 mg | ORAL_TABLET | Freq: Every day | ORAL | Status: DC
  Administered 2020-06-10 – 2020-06-13 (×4): 50 mg via ORAL
  Filled 2020-06-10 (×4): qty 2

## 2020-06-10 MED ORDER — EMPAGLIFLOZIN 10 MG PO TABS
10.0000 mg | ORAL_TABLET | Freq: Every day | ORAL | Status: DC
  Filled 2020-06-10 (×2): qty 1

## 2020-06-10 MED ORDER — PANTOPRAZOLE SODIUM 40 MG PO TBEC
40.0000 mg | DELAYED_RELEASE_TABLET | Freq: Two times a day (BID) | ORAL | Status: DC
  Administered 2020-06-10 – 2020-06-13 (×7): 40 mg via ORAL
  Filled 2020-06-10 (×7): qty 1

## 2020-06-10 MED ORDER — ZOLPIDEM TARTRATE 5 MG PO TABS
10.0000 mg | ORAL_TABLET | Freq: Every evening | ORAL | Status: DC | PRN
  Administered 2020-06-10 – 2020-06-12 (×4): 10 mg via ORAL
  Filled 2020-06-10 (×4): qty 2

## 2020-06-10 MED ORDER — ONDANSETRON HCL 4 MG/2ML IJ SOLN
4.0000 mg | Freq: Four times a day (QID) | INTRAMUSCULAR | Status: DC | PRN
  Administered 2020-06-10: 4 mg via INTRAVENOUS
  Filled 2020-06-10: qty 2

## 2020-06-10 MED ORDER — GABAPENTIN 400 MG PO CAPS
800.0000 mg | ORAL_CAPSULE | Freq: Three times a day (TID) | ORAL | Status: DC
  Administered 2020-06-10 – 2020-06-13 (×10): 800 mg via ORAL
  Filled 2020-06-10 (×10): qty 2

## 2020-06-10 MED ORDER — METOPROLOL SUCCINATE ER 25 MG PO TB24
25.0000 mg | ORAL_TABLET | Freq: Two times a day (BID) | ORAL | Status: DC
  Administered 2020-06-10 – 2020-06-13 (×6): 25 mg via ORAL
  Filled 2020-06-10 (×6): qty 1

## 2020-06-10 MED ORDER — FUROSEMIDE 10 MG/ML IJ SOLN
80.0000 mg | Freq: Once | INTRAMUSCULAR | Status: AC
  Administered 2020-06-10: 80 mg via INTRAVENOUS
  Filled 2020-06-10: qty 8

## 2020-06-10 MED ORDER — POTASSIUM CHLORIDE CRYS ER 20 MEQ PO TBCR
40.0000 meq | EXTENDED_RELEASE_TABLET | Freq: Once | ORAL | Status: AC
  Administered 2020-06-10: 40 meq via ORAL
  Filled 2020-06-10: qty 2

## 2020-06-10 MED ORDER — SODIUM CHLORIDE 0.9% FLUSH
3.0000 mL | Freq: Two times a day (BID) | INTRAVENOUS | Status: DC
  Administered 2020-06-10 – 2020-06-13 (×7): 3 mL via INTRAVENOUS

## 2020-06-10 MED ORDER — APIXABAN 5 MG PO TABS: 5.0000 mg | ORAL_TABLET | Freq: Two times a day (BID) | ORAL | Status: DC

## 2020-06-10 MED ORDER — PRAVASTATIN SODIUM 10 MG PO TABS
20.0000 mg | ORAL_TABLET | Freq: Every day | ORAL | Status: DC
  Administered 2020-06-10 – 2020-06-12 (×3): 20 mg via ORAL
  Filled 2020-06-10 (×3): qty 2

## 2020-06-10 MED ORDER — ALBUTEROL SULFATE HFA 108 (90 BASE) MCG/ACT IN AERS
1.0000 | INHALATION_SPRAY | RESPIRATORY_TRACT | Status: DC | PRN
  Filled 2020-06-10: qty 6.7

## 2020-06-10 MED ORDER — ACETAMINOPHEN 325 MG PO TABS
650.0000 mg | ORAL_TABLET | ORAL | Status: DC | PRN
  Administered 2020-06-11 – 2020-06-13 (×4): 650 mg via ORAL
  Filled 2020-06-10 (×5): qty 2

## 2020-06-10 MED ORDER — SODIUM CHLORIDE 0.9 % IV SOLN: 250.0000 mL | INTRAVENOUS | Status: DC | PRN

## 2020-06-10 MED ORDER — FUROSEMIDE 10 MG/ML IJ SOLN
100.0000 mg | Freq: Two times a day (BID) | INTRAVENOUS | Status: DC
  Administered 2020-06-10 – 2020-06-11 (×3): 100 mg via INTRAVENOUS
  Filled 2020-06-10 (×4): qty 10

## 2020-06-10 MED ORDER — SERTRALINE HCL 50 MG PO TABS
25.0000 mg | ORAL_TABLET | Freq: Every day | ORAL | Status: DC
  Filled 2020-06-10: qty 1

## 2020-06-10 NOTE — ED Provider Notes (Signed)
I called report to Dr. Cyd Silence with [facility].  Patient was ordered an additional dose of Lasix for diuresis.  Patient is awake/alert at this time with significant peripheral edema.  He is in no respiratory distress   Ripley Fraise, MD 06/10/20 0100

## 2020-06-10 NOTE — Consult Note (Addendum)
Advanced Heart Failure Team Consult Note   Primary Physician: Enid Skeens., MD PCP-Cardiologist:  Berniece Salines, DO  Reason for Consultation: Heart Failure   HPI:    Charles [name] is seen today for evaluation of heart failure  at the request of Dr [place]   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  Had  Coronary CTA in 7/21 with calcium score 0 but concern for soft plaque distal CFX and RCA.  By [facility], there was flow-limiting stenosis in distal CFX and distal RCA.   Over the last year and a half he has had multiple admits for A/C diastolic heart failure/A fib RVR, and chest pain.    Most recent ECHO 03/2020 EF 60-65%, RV not well visualized, and grade IIDD.   RHC/LHC 05/10/2020 with non obstructive CAD and elevated filling pressures R>L. Bumex was increased to 4 mg am /3 mg pm  He has been followed by HF Paramedicine and HF team closely. He has been offered Cardiomems but refuses. Over the last week he noticed increased leg edema and had a blister on his RLE. Says he has been avoiding high sodium foods and limiting fluid intake. Weight has gone down a couple few pounds to 365 pounds.    Yesterday he had increased shortness of breath so his neighbor called 911. Presented [facility] via EMS due to increased shortness of breath and lower extremity edema. CXR with no active disease. Given 120 mg lasix IV.  Pertinent admission labs included: K 4.9, creatinine 0.9, BNP 41, Platelets 46, Hgb 12.8,  SARS 2 negative.  Feeling a little better today.   Review of Systems: [y] = yes, [ ]  = no   . General: Weight gain [ ] ; Weight loss [ ] ; Anorexia [ ] ; Fatigue [Y ]; Fever [ ] ; Chills [ ] ; Weakness [ ]   . Cardiac: Chest pain/pressure [ ] ; Resting SOB [ ] ; Exertional SOB [Y ]; Orthopnea [ ] ; Pedal Edema [Y ]; Palpitations [ ] ; Syncope [ ] ; Presyncope [ ] ; Paroxysmal  nocturnal dyspnea[ ]   . Pulmonary: Cough [ ] ; Wheezing[ ] ; Hemoptysis[ ] ; Sputum [ ] ; Snoring [ ]   . GI: Vomiting[ ] ; Dysphagia[ ] ; Melena[ ] ; Hematochezia [ ] ; Heartburn[ ] ; Abdominal pain [ ] ; Constipation [ ] ; Diarrhea [ ] ; BRBPR [ ]   . GU: Hematuria[ ] ; Dysuria [ ] ; Nocturia[ ]   . Vascular: Pain in legs with walking [ ] ; Pain in feet with lying flat [ ] ; Non-healing sores [ ] ; Stroke [ ] ; TIA [ ] ; Slurred speech [ ] ;  . Neuro: Headaches[ ] ; Vertigo[ ] ; Seizures[ ] ; Paresthesias[ ] ;Blurred vision [ ] ; Diplopia [ ] ; Vision changes [ ]   . Ortho/Skin: Arthritis [ Y]; Joint pain [ ] ; Muscle pain [ ] ; Joint swelling [ ] ; Back Pain [Y ]; Rash [ ]   . Psych: Depression[ ] ; Anxiety[ ]   . Heme: Bleeding problems [ ] ; Clotting disorders [ ] ; Anemia [ ]   . Endocrine: Diabetes [ ] ; Thyroid dysfunction[ ]   Home Medications Prior to Admission medications   Medication Sig Start Date End Date Taking? Authorizing Provider  acetaminophen (TYLENOL) 500 MG tablet Take 1,000 mg by mouth every 6 (six) hours as needed for moderate pain or headache.   Yes [provider]  apixaban (ELIQUIS) 5 MG TABS tablet Take 1 tablet (5 mg total) by mouth 2 (two) times  daily. 08/12/19  Yes Strader, [place] M, PA-C  bumetanide (BUMEX) 1 MG tablet Take 4 tablets (4 mg total) by mouth daily with breakfast AND 3 tablets (3 mg total) every evening. 05/10/20  Yes Larey Dresser, MD  Buprenorphine HCl-Naloxone HCl 8-2 MG FILM Place 1 tablet under the tongue in the morning and at bedtime.    Yes [provider]  eplerenone (INSPRA) 50 MG tablet Take 2 tablets (100 mg total) by mouth daily. 02/27/20  Yes Clegg, [name] D, NP  gabapentin (NEURONTIN) 800 MG tablet Take 1 tablet (800 mg total) by mouth 3 (three) times daily. 03/23/19  Yes Angiulli, Lavon Paganini, PA-C  metoprolol succinate (TOPROL XL) 25 MG 24 hr tablet Take 1 tablet (25 mg total) by mouth daily. 04/11/20  Yes Larey Dresser, MD  ondansetron (ZOFRAN ODT) 4 MG  disintegrating tablet Take 1 tablet (4 mg total) by mouth every 8 (eight) hours as needed for nausea or vomiting. 04/24/20  Yes Larey Dresser, MD  pantoprazole (PROTONIX) 40 MG tablet Take 40 mg by mouth 2 (two) times daily.   Yes [provider]  polyethylene glycol (MIRALAX / GLYCOLAX) 17 g packet Take 17 g by mouth daily. Patient taking differently: Take 17 g by mouth daily as needed for mild constipation. 03/23/19  Yes Angiulli, Lavon Paganini, PA-C  potassium chloride SA (KLOR-CON) 20 MEQ tablet Take 40 mEq by mouth 2 (two) times daily.   Yes [provider]  pravastatin (PRAVACHOL) 20 MG tablet Take 1 tablet (20 mg total) by mouth daily. 03/23/19  Yes Angiulli, Lavon Paganini, PA-C  sodium chloride (OCEAN) 0.65 % SOLN nasal spray Place 1 spray into both nostrils as needed for congestion.   Yes [provider]  VENTOLIN HFA 108 (90 Base) MCG/ACT inhaler Inhale 1 puff into the lungs every 4 (four) hours as needed for shortness of breath. Patient taking differently: Inhale 1-2 puffs into the lungs every 4 (four) hours as needed for shortness of breath. 03/23/19  Yes Angiulli, Lavon Paganini, PA-C  zolpidem (AMBIEN) 10 MG tablet Take 10 mg by mouth at bedtime as needed for sleep. 05/19/19  Yes [provider]  empagliflozin (JARDIANCE) 10 MG TABS tablet Take 1 tablet (10 mg total) by mouth daily before breakfast. Patient not taking: No sig reported 04/11/20   Larey Dresser, MD  metolazone (ZAROXOLYN) 2.5 MG tablet Take 1 tablet (2.5 mg total) by mouth 2 (two) times a week. Tuesdays and Fridays with extra KCL 24mq Patient not taking: No sig reported 04/11/20   MLarey Dresser MD    Past Medical History: Past Medical History:  Diagnosis Date  . Acute on chronic congestive heart failure ([facility]   . Acute on chronic diastolic CHF (congestive heart failure) ([place] 08/23/2019  . Acute on chronic right-sided heart failure ([place] 08/06/2019  . Acute respiratory failure with hypoxia  ([facility]   . Anemia of chronic disease   . Atrial fibrillation ([facility] 12/23/2019  . Atrial tachycardia ([facility] 08/12/2019  . Benign essential HTN   . Chest pain 08/23/2019  . Chronic anticoagulation 12/26/2019  . Chronic heart failure with preserved EF 05/30/2019   Echo 07/2019: EF 55-60, GR 1 DD  . Chronic pain syndrome   . Cirrhosis of liver without ascites ([place]   . Class 2 severe obesity due to excess calories with serious comorbidity and body mass index (BMI) of 39.0 to 39.9 in adult ([facility] 05/30/2019  . Debility 03/13/2019  . Drug induced constipation   .  Dyspnea   . Fatty liver disease, nonalcoholic   . GIB (gastrointestinal bleeding) 03/04/2019  . Heart failure, systolic, acute ([place]) 2/70/6237  . History of pulmonary embolism 08/12/2019  . Hyperlipidemia 01/24/2019  . Hypertension 01/24/2019  . Hypoalbuminemia due to protein-calorie malnutrition ([place])   . Hypokalemia   . Hyponatremia   . Hypotension due to drugs   . Insomnia 01/24/2019  . Lactic acidosis 08/23/2019  . Lumbar disc herniation 01/24/2019  . Mixed hyperlipidemia 01/24/2019  . Morbid obesity with BMI of 40.0-44.9, adult ([place]) 05/30/2019  . Normocytic anemia 06/08/2019  . Occasional tremors 08/12/2019  . OSA (obstructive sleep apnea) 08/12/2019  . Palpitations 12/26/2019  . Peripheral edema   . Personal history of DVT (deep vein thrombosis) 12/26/2019  . Portal hypertensive gastropathy ([place]) 03/06/2019   Seen on EGD 03/06/2019  . Pulmonary emboli ([place]) 06/08/2019  . Pulmonary embolism ([place]) 06/08/2019  . Redness and swelling of lower leg 08/23/2019  . Shock ([place])   . Thrombocytopenia ([place])     Past Surgical History: Past Surgical History:  Procedure Laterality Date  . ESOPHAGOGASTRODUODENOSCOPY (EGD) WITH PROPOFOL N/A 03/05/2019   Procedure: ESOPHAGOGASTRODUODENOSCOPY (EGD) WITH PROPOFOL;  Surgeon: Doran Stabler, MD;  Location: [place];  Service: Gastroenterology;  Laterality: N/A;  Large Blood Clot Removed  .  ESOPHAGOGASTRODUODENOSCOPY (EGD) WITH PROPOFOL N/A 03/06/2019   Procedure: ESOPHAGOGASTRODUODENOSCOPY (EGD) WITH PROPOFOL;  Surgeon: [place], MD;  Location: [name];  Service: Gastroenterology;  Laterality: N/A;  . HEMORROIDECTOMY    . RIGHT HEART CATH N/A 08/09/2019   Procedure: RIGHT HEART CATH;  Surgeon: Larey Dresser, MD;  Location: [address] CV LAB;  Service: Cardiovascular;  Laterality: N/A;  . RIGHT/LEFT HEART CATH AND CORONARY ANGIOGRAPHY N/A 05/10/2020   Procedure: RIGHT/LEFT HEART CATH AND CORONARY ANGIOGRAPHY;  Surgeon: Larey Dresser, MD;  Location: [place] [facility];  Service: Cardiovascular;  Laterality: N/A;    Family History: Family History  Problem Relation Age of Onset  . Hyperlipidemia Mother   . Hypertension Mother   . Stroke Mother   . CAD Maternal Grandmother   . CAD Maternal Grandfather     Social History: Social History   Socioeconomic History  . Marital status: Single    Spouse name: Not on file  . Number of children: Not on file  . Years of education: Not on file  . Highest education level: Not on file  Occupational History  . Not on file  Tobacco Use  . Smoking status: Never Smoker  . Smokeless tobacco: Never Used  Vaping Use  . Vaping Use: Never used  Substance and Sexual Activity  . Alcohol use: Not Currently  . Drug use: Not Currently    Types: Marijuana    Comment: Smoked for 30 years  . Sexual activity: Not on file  Other Topics Concern  . Not on file  Social History Narrative  . Not on file   Social Determinants of Health   Financial Resource Strain: Low Risk   . Difficulty of Paying Living Expenses: Not very hard  Food Insecurity: No Food Insecurity  . Worried About Charity fundraiser in the Last Year: Never true  . Ran Out of Food in the Last Year: Never true  Transportation Needs: Unmet Transportation Needs  . Lack of Transportation (Medical): Yes  . Lack of Transportation (Non-Medical): Yes  Physical  Activity: Not on file  Stress: Not on file  Social Connections: Not on file    Allergies:  Allergies  Allergen Reactions  . Penicillins     Broke out in convulsions as a child  Did it involve swelling of the face/tongue/throat, SOB, or low BP? N/A Did it involve sudden or severe rash/hives, skin peeling, or any reaction on the inside of your mouth or nose? N/A Did you need to seek medical attention at a hospital or doctor's office?N/A When did it last happen?N/A If all above answers are "NO", may proceed with cephalosporin use.    Objective:    Vital Signs:   Temp:  [97.6 F (36.4 C)-99.1 F (37.3 C)] 98.3 F (36.8 C) (02/28 1134) Pulse Rate:  [62-80] 62 (02/28 1134) Resp:  [9-20] 20 (02/28 1134) BP: (106-130)/(63-86) 122/79 (02/28 1134) SpO2:  [94 %-100 %] 97 % (02/28 1134) Weight:  [164.6 kg-165.6 kg] 164.6 kg (02/28 0221) Last BM Date: 06/09/20  Weight change: Filed Weights   06/09/20 2102 06/10/20 0221  Weight: (!) 165.6 kg (!) 164.6 kg    Intake/Output:   Intake/Output Summary (Last 24 hours) at 06/10/2020 1210 Last data filed at 06/10/2020 0925 Gross per 24 hour  Intake 486 ml  Output 600 ml  Net -114 ml      Physical Exam    General:  In bed. No resp difficulty HEENT: normal Neck: supple. JVP 9-10  . Carotids 2+ bilat; no bruits. No lymphadenopathy or thyromegaly appreciated. Cor: PMI nondisplaced. Regular rate & rhythm. No rubs, gallops or murmurs. Lungs: clear on room air.  Abdomen: obese, soft, nontender, nondistended. No hepatosplenomegaly. No bruits or masses. Good bowel sounds. Extremities: no cyanosis, clubbing, rash, R and LLE trace-1+ with chronic hyperpigmentation.  Neuro: alert & orientedx3, cranial nerves grossly intact. moves all 4 extremities w/o difficulty. Affect pleasant   Telemetry  SR 70-80s   EKG    SR 72 bpm   Labs   Basic Metabolic Panel: Recent Labs  Lab 06/09/20 2224 06/10/20 0259  NA 134* 136  K 4.9 3.7  CL 95*  95*  CO2 25 30  GLUCOSE 106* 108*  BUN 15 13  CREATININE 0.92 0.95  CALCIUM 9.0 9.4  MG  --  2.3    Liver Function Tests: Recent Labs  Lab 06/09/20 2224  AST 55*  ALT 25  ALKPHOS 80  BILITOT 1.1  PROT 7.2  ALBUMIN 3.6   No results for input(s): LIPASE, AMYLASE in the last 168 hours. No results for input(s): AMMONIA in the last 168 hours.  CBC: Recent Labs  Lab 06/09/20 2224  WBC 7.2  HGB 12.8*  HCT 44.6  MCV 86.1  PLT 46*    Cardiac Enzymes: No results for input(s): CKTOTAL, CKMB, CKMBINDEX, TROPONINI in the last 168 hours.  BNP: BNP (last 3 results) Recent Labs    04/16/20 1553 05/07/20 0140 06/10/20 0259  BNP 38.9 351.2* 41.2    ProBNP (last 3 results) No results for input(s): PROBNP in the last 8760 hours.   CBG: No results for input(s): GLUCAP in the last 168 hours.  Coagulation Studies: No results for input(s): LABPROT, INR in the last 72 hours.   Imaging   DG Chest Port 1 View  Result Date: 06/09/2020 CLINICAL DATA:  Shortness of breath EXAM: PORTABLE CHEST 1 VIEW COMPARISON:  May 07, 2020 FINDINGS: Emphysematous changes are suspected. There are chronic appearing lung markings at the lung bases suggestive of scarring or atelectasis. There is no pneumothorax. No large pleural effusion. No focal infiltrate. The heart size is stable. IMPRESSION: No active disease. Electronically Signed  By: Constance Holster M.D.   On: 06/09/2020 22:00      Medications:     Current Medications: . buprenorphine-naloxone  1 tablet Sublingual BID  . gabapentin  800 mg Oral TID  . metoprolol succinate  25 mg Oral Daily  . pantoprazole  40 mg Oral BID  . pravastatin  20 mg Oral QHS  . sodium chloride flush  3 mL Intravenous Q12H  . spironolactone  50 mg Oral Daily     Infusions: . sodium chloride    . furosemide 100 mg (06/10/20 0826)       Assessment/Plan   1. A/C Diastolic Heart Failure ECHO 03/2020 EF 17-61% with diastolic dysfunction.   - Admitted with NYHA III symptoms. BNP was not elevated on admit. CXR ok.  - Mild volume overload. Continue IV lasix today. Tomorrow will consider starting torsemide 60 mg twice a day and continue metolazone twice a week. At home he was on bumex which has not been effective.  - Continue spironolactone 50 mg daily.  - Renal function stable.   2. PAF -In NSR .  -Says his heart goes up in the 130-140 with activity but comes back down at est.  - Increase Toprol XL to 25 mg twice a day. He has tried Toprol XL 50 mg daily but didn't tolerate due to lightheadedness. Hopefully splitting the dose will help.   - Currently off eliquis with thrombocytopenia  3.Thrombocytopenia  -Plts 46 on admit but a month ago Plts 150  -Eliquis on hold. Repeating CBC now.  -No source of bleeding.   4. OSA Will need CPAP nightly.   5. H/O PE/DVT  Add SCDs until back on eliquis   6. CAD - No chest pain.  -LHC - non obstuctive CAD - Continue statin+ bb.   He will need HF Paramedicine at d/c. We discussed cardiomems but he does not want to pursue.   Length of Stay: 0  [name], NP  06/10/2020, 12:10 PM  Advanced Heart Failure Team Pager [phone] (M-F; [place])  Please contact [facility] for night-coverage after hours (4p -7a ) and weekends on [email or web address]  Patient seen with NP, agree with the above note.   Patient has symptomatic diastolic CHF, was admitted with increased dyspnea.  He has been taking spironolactone, Jardiance, Bumex, and once weekly metolazone.  He also has significant anxiety.    He is concerned because his HR jumps up to the 140s when he walks around the house.   Also thrombocytopenia noted today, plts normal in the past.  He has not received heparin products.   General: NAD, obese.  Neck: No JVD, no thyromegaly or thyroid nodule.  Lungs: Clear to auscultation bilaterally with normal respiratory effort. CV: Nondisplaced PMI.  Heart regular S1/S2, no S3/S4, no murmur.  1+  ankle edema.  No carotid bruit.  Normal pedal pulses.  Abdomen: Soft, nontender, no hepatosplenomegaly, no distention.  Skin: Intact without lesions or rashes.  Neurologic: Alert and oriented x 3.  Psych: Normal affect. Extremities: No clubbing or cyanosis. Small blister left lower leg.  HEENT: Normal. .  1. Acute on chronic diastolic CHF: Echo in 62/69 with EF 48-54%, grade 2 diastolic dysfunction, RV poorly visualized.  LHC/RHC in 1/22 showed no significant CAD, mildly elevated right heart filling pressures.  Patient has had multiple admissions with CHF.  I suspect that obesity and deconditioning as well as anxiety play a large role in his symptomatology.  He has had a couple  doses of IV Lasix. He does not appear particularly volume overloaded on exam today.  - Continue Lasix 100 mg IV bid today, probably to po tomorrow.   - When he resumes po diuretics, will transition to torsemide 60 mg bid.  He will continue metolazone 2.5 mg once weekly (has only been taking once a week).  - Ideally, would have Cardiomems placed to help guide treatment.  However, he is very nervous about placement as it cannot be removed.  Still not ready to have it placed. - Continue Jardiance 10 mg daily for HFpEF.     - Continue spironolactone 50 mg daily.  - Reasonable to increase Toprol XL to 25 mg bid to limit distressing HR excursions.  2. H/o PE/DVT: Suspect due to sedentary lifestyle.  Occurred in 2/21.  V/Q scan in 6/21 did not show evidence for chronic PE.   - Continue apixaban. No bleeding issues. 3. OSA: Cannot tolerate CPAP.  4. Anxiety/panic: Very anxious in general.  - Restart sertraline 25 mg daily.  5. Atrial fibrillation: Paroxysmal. He is in NSR.  - Continue apixaban.  6. Deconditioning: I think that a lot of his dyspnea and palpitations can be explained by deconditioning. Will arrange for PT consult and cardiac rehab.  7.Thrombocytopenia: Plts down to 46K, unsure cause of drop.  No heparin products,  has been normal in past.  ?lab error.  - Resend CBC now.  - Hold apixaban until we see the result of the repeat CBC.    Loralie Champagne 06/10/2020 1:32 PM

## 2020-06-10 NOTE — Progress Notes (Signed)
PROGRESS NOTE    Charles [name]  LFY:[id number] DOB: 21-Feb-1956 DOA: 06/09/2020 PCP: Enid Skeens., MD    Brief Narrative:  Charles [name] is a 65 year old male with past medical history significant for chronic diastolic congestive heart failure, right-sided heart failure, paroxysmal atrial fibrillation on Eliquis, history of thromboembolic disease, hyperlipidemia, essential hypertension, obesity, obstructive sleep apnea, GERD and cirrhosis who presented to [facility] ED via EMS for progressive shortness of breath. Onset 3 days ago with worsening associated with bilateral lower extremity edema. Now reports multiple blisters on lower extremities following worsening edema. [name] also reports paroxysmal nocturnal dyspnea. Denies chest pain, no cough, no fevers or sick contacts, no recent travel or contact confirmed with Covid-19 infected individual.  In [facility] ED, temperature 99 F, HR 80, RR 16, BP 122/73, SPO2 98% on room air. Sodium 134, potassium 4.9, chloride 95, CO2 25, glucose 106, BUN 15, creatinine 0.92, AST 55, ALT 25, total bilirubin 1.1. WBC 7.2, hemoglobin 12.8, platelets 46.Covid-19/SARS-CoV-2 negative. Chest x-ray with no acute cardiopulmonary disease process. EKG with normal sinus rhythm, rate 72, normal QTC, no concerning ST elevation/depression or T wave inversion. [name] was given 120 mg IV Lasix in [facility] ED. Hospitalist service consulted for further evaluation and management of acute on chronic CHF exacerbation.   Assessment & Plan:   Principal Problem:   Acute on chronic right heart failure (HCC) Active Problems:   Mixed hyperlipidemia   Cirrhosis of liver without ascites (HCC)   Chronic pain syndrome   History of pulmonary embolism   OSA (obstructive sleep apnea)   Acute on chronic diastolic CHF (congestive heart failure) (HCC)   AF (paroxysmal atrial fibrillation) (HCC)   Essential hypertension   GERD without esophagitis   Acute on chronic diastolic congestive  heart failure  Right-sided heart failure [name] presenting to [facility] ED with progressive shortness of breath associated with orthopnea and lower extremity edema over Charles past 3 days. EKG with NSR, HR 72 with no concerning ST elevation/depression or T wave inversions. Chest x-ray with no acute cardiopulmonary disease process. BNP 41.2. High-sensitivity troponin 7, within normal limits. TTE 03/2020 with LVEF 60 to 65%, RV not well visualized, grade 2 diastolic dysfunction. Follows with cardiology outpatient, has been offered CardioMEMS but has refused. Home regimen includes Bumex 4 mg qam and 53m qpm, metoprolol succinate 25 mg p.o. daily, and metolazone 2.5 mg 2 times weekly. --Cardiology following, appreciate assistance --Furosemide 100 mg IV q12h --Spironolactone 50 mg PO daily --Jardiance 10 mg p.o. daily --Strict I's and O's and daily weights --Follow BMP daily  Thrombocytopenia Platelets 46 on presentation, no clinical evidence of blood loss/bleeding. [name] with underlying history of cirrhosis; but significant drop from previous platelet level of 156. --repeat Plt 46>158; ? Lab error --restart home Eliquis --repeat CBC in am  Paroxysmal atrial fibrillation Essential hypertension --Metoprolol succinate increased to 25 mg p.o. BID --Eliquis 5 mg p.o. twice daily --Continue to monitor on telemetry  OSA --Nocturnal CPAP, but likely noncompliant  Chronic pain syndrome --Continue home Suboxone 8-250mSL BID --Gabapentin 800 mg p.o. 3 times daily  History of PE --Holding home Eliquis secondary to thrombocytopenia as above  GERD without esophagitis: Continue PPI  Anxiety/depression: --Sertraline 25 mg p.o. daily  Hyperlipidemia: Pravastatin 20 mg p.o. daily  Obese Body mass index is 37.95 kg/m. Discussed with [name] needs for aggressive lifestyle changes/weight loss as this complicates all facets of care.  Outpatient follow-up with PCP.     Weakness/debility/deconditioning: --PT/OT evaluation  DVT prophylaxis: Eliquis  Code Status: Full Code Family Communication: No family present at bedside this morning  Disposition Plan:  Level of care: Telemetry Cardiac Status is: Inpatient  Remains inpatient appropriate because:Ongoing diagnostic testing needed not appropriate for outpatient work up, Unsafe d/c plan, IV treatments appropriate due to intensity of illness or inability to take PO and Inpatient level of care appropriate due to severity of illness   Dispo: [name] is from: Home              Anticipated d/c is to: Home              [name] currently is medically stable to d/c.   Difficult to place [name] No   Consultants:   Cardiology, Dr. [name]  Procedures:   None  Antimicrobials:   None   Subjective: [name] seen and examined at bedside, resting comfortably in bed. Continues with generalized ill feeling, abdominal upset. Reports constipation yesterday in which she took MiraLAX with improvement. Reports continued lower extremity edema and shortness of breath. No other complaints or concerns at this time. Denies headache, no fever/chills/night sweats, no nausea/vomiting/diarrhea, no chest pain, no palpitations, no weakness, no fatigue, no paresthesias. No acute events overnight per nursing staff.  Objective: Vitals:   06/10/20 0224 06/10/20 0314 06/10/20 0727 06/10/20 1134  BP: 127/86 116/78 130/68 122/79  Pulse: 78 74 66 62  Resp: 17 20 20 20   Temp: 98.5 F (36.9 C) 97.6 F (36.4 C) 98.5 F (36.9 C) 98.3 F (36.8 C)  TempSrc: Oral Oral Oral Oral  SpO2: 99% 97% 99% 97%  Weight:      Height:        Intake/Output Summary (Last 24 hours) at 06/10/2020 1411 Last data filed at 06/10/2020 1337 Gross per 24 hour  Intake 726 ml  Output 900 ml  Net -174 ml   Filed Weights   06/09/20 2102 06/10/20 0221  Weight: (!) 165.6 kg (!) 164.6 kg    Examination:  General exam: Appears calm and  comfortable, obese, ill in appearance Respiratory system: Clear to auscultation. Respiratory effort normal. Oxygenating well on room air Cardiovascular system: S1 & S2 heard, RRR. No JVD, murmurs, rubs, gallops or clicks. No pedal edema. Gastrointestinal system: Abdomen is nondistended, soft and nontender. No organomegaly or masses felt. Normal bowel sounds heard. Central nervous system: Alert and oriented. No focal neurological deficits. Extremities: Symmetric 5 x 5 power. Skin: No rashes, lesions or ulcers Psychiatry: Judgement and insight appear poor. Mood & affect appropriate.     Data Reviewed: I have personally reviewed following labs and imaging studies  CBC: Recent Labs  Lab 06/09/20 2224 06/10/20 1243  WBC 7.2 8.8  HGB 12.8* 12.8*  HCT 44.6 41.2  MCV 86.1 80.6  PLT 46* 469   Basic Metabolic Panel: Recent Labs  Lab 06/09/20 2224 06/10/20 0259  NA 134* 136  K 4.9 3.7  CL 95* 95*  CO2 25 30  GLUCOSE 106* 108*  BUN 15 13  CREATININE 0.92 0.95  CALCIUM 9.0 9.4  MG  --  2.3   GFR: Estimated Creatinine Clearance: 140.2 mL/min (by C-G formula based on SCr of 0.95 mg/dL). Liver Function Tests: Recent Labs  Lab 06/09/20 2224  AST 55*  ALT 25  ALKPHOS 80  BILITOT 1.1  PROT 7.2  ALBUMIN 3.6   No results for input(s): LIPASE, AMYLASE in [facility] last 168 hours. No results for input(s): AMMONIA in Charles last 168 hours. Coagulation Profile: No results for input(s): INR, PROTIME in  Charles last 168 hours. Cardiac Enzymes: No results for input(s): CKTOTAL, CKMB, CKMBINDEX, TROPONINI in Charles last 168 hours. BNP (last 3 results) No results for input(s): PROBNP in Charles last 8760 hours. HbA1C: No results for input(s): HGBA1C in [facility] last 72 hours. CBG: No results for input(s): GLUCAP in Charles last 168 hours. Lipid Profile: No results for input(s): CHOL, HDL, LDLCALC, TRIG, CHOLHDL, LDLDIRECT in Charles last 72 hours. Thyroid Function Tests: No results for input(s): TSH, T4TOTAL,  FREET4, T3FREE, THYROIDAB in [facility] last 72 hours. Anemia Panel: No results for input(s): VITAMINB12, FOLATE, FERRITIN, TIBC, IRON, RETICCTPCT in Charles last 72 hours. Sepsis Labs: No results for input(s): PROCALCITON, LATICACIDVEN in Charles last 168 hours.  Recent Results (from Charles past 240 hour(s))  SARS CORONAVIRUS 2 (TAT 6-24 HRS) Nasopharyngeal Nasopharyngeal Swab     Status: None   Collection Time: 06/10/20  1:12 AM   Specimen: Nasopharyngeal Swab  Result Value Ref Range Status   SARS Coronavirus 2 NEGATIVE NEGATIVE Final    Comment: (NOTE) SARS-CoV-2 target nucleic acids are NOT DETECTED.  [facility] SARS-CoV-2 RNA is generally detectable in upper and lower respiratory specimens during Charles acute phase of infection. Negative results do not preclude SARS-CoV-2 infection, do not rule out co-infections with other pathogens, and should not be used as Charles sole basis for treatment or other [name] management decisions. Negative results must be combined with clinical observations, [name] history, and epidemiological information. Charles expected result is Negative.  Fact Sheet for Patients: SugarRoll.be  Fact Sheet for Healthcare Providers: https://www.woods-mathews.com/  This test is not yet approved or cleared by [place] FDA and  has been authorized for detection and/or diagnosis of SARS-CoV-2 by FDA under an Emergency Use Authorization (EUA). This EUA will remain  in effect (meaning this test can be used) for Charles duration of Charles COVID-19 declaration under Se ction 564(b)(1) of [place] Act, 21 U.S.C. section 360bbb-3(b)(1), unless Charles authorization is terminated or revoked sooner.  Performed at [facility], [place] [address] [place], [place] [address]          Radiology Studies: DG Chest Port 1 View  Result Date: 06/09/2020 CLINICAL DATA:  Shortness of breath EXAM: PORTABLE CHEST 1 VIEW COMPARISON:  May 07, 2020 FINDINGS:  Emphysematous changes are suspected. There are chronic appearing lung markings at Charles lung bases suggestive of scarring or atelectasis. There is no pneumothorax. No large pleural effusion. No focal infiltrate. Charles heart size is stable. IMPRESSION: No active disease. Electronically Signed   By: Constance Holster M.D.   On: 06/09/2020 22:00        Scheduled Meds:  buprenorphine-naloxone  1 tablet Sublingual BID   empagliflozin  10 mg Oral Daily   gabapentin  800 mg Oral TID   metoprolol succinate  25 mg Oral BID   pantoprazole  40 mg Oral BID   pravastatin  20 mg Oral QHS   sertraline  25 mg Oral Daily   sodium chloride flush  3 mL Intravenous Q12H   spironolactone  50 mg Oral Daily   Continuous Infusions:  sodium chloride     furosemide 100 mg (06/10/20 0826)     LOS: 0 days    Time spent: 42 minutes spent on chart review, discussion with nursing staff, consultants, updating family and interview/physical exam; more than 50% of that time was spent in counseling and/or coordination of care.    Preesha Benjamin J [place] DO [facility] Available via Epic secure chat 7am-7pm After these hours,  please refer to coverage provider listed on [email or web address] 06/10/2020, 2:11 PM

## 2020-06-10 NOTE — H&P (Signed)
History and Physical    Charles [name] OHY:[id number] DOB: [birth year] DOA: 06/09/2020  PCP: Enid Skeens., MD  Patient coming from: home via EMS   Chief Complaint:   Shortness of breath  HPI:    65 year old male with past medical history of diastolic and right-sided congestive heart failure (Echo 03/2020 EF 60-65%), paroxysmal atrial fibrillation on Eliquis for anticoagulation, history of thromboembolic disease, hyperlipidemia, hypertension, obesity, obstructive sleep apnea, gastroesophageal reflux disease and cirrhosis who presents to [facility] emergency department via EMS due to complaints of shortness of breath.  Patient explains that approximately 3 days ago he began to experience bilateral lower extremity swelling.  The swelling progressively worsened over the next several days became associated with multiple blisters forming on the lower extremities, particularly the anterior left lower extremity.  Patient states that as his swelling worsened he also became short of breath.  The shortness of breath is moderate to severe in intensity, worse with exertion and improved with rest.  Patient also complains of associated paroxysmal nocturnal dyspnea and pillow orthopnea over the span of time patient denies any associated chest pain, cough, fevers, sick contacts, recent travel or contact with confirmed COVID-19 infection.  Patient surprisingly denies associated weight gain this time.  Patient symptoms continued to persist and worsen to the patient contacted EMS who promptly brought the patient to [facility] emergency department for evaluation.  Upon evaluation in the emergency department patient was clinically felt to be suffering from a combination of right-sided heart failure as well as diastolic congestive heart failure.  Chest x-ray revealed no evidence of pulmonary edema.  Troponin was unremarkable.  Considering patient's chronic use of high doses of Bumex in the  outpatient setting, patient was administered 120 mg of intravenous Lasix.  The hospitalist group was then called to assess the patient for admission to the hospital.  Review of Systems:   Review of Systems  Respiratory: Positive for shortness of breath.     Past Medical History:  Diagnosis Date  . Acute on chronic congestive heart failure ([place])   . Acute on chronic diastolic CHF (congestive heart failure) ([place]) 08/23/2019  . Acute on chronic right-sided heart failure ([place]) 08/06/2019  . Acute respiratory failure with hypoxia ([place])   . Anemia of chronic disease   . Atrial fibrillation ([place]) 12/23/2019  . Atrial tachycardia ([place]) 08/12/2019  . Benign essential HTN   . Chest pain 08/23/2019  . Chronic anticoagulation 12/26/2019  . Chronic heart failure with preserved EF 05/30/2019   Echo 07/2019: EF 55-60, GR 1 DD  . Chronic pain syndrome   . Cirrhosis of liver without ascites ([place])   . Class 2 severe obesity due to excess calories with serious comorbidity and body mass index (BMI) of 39.0 to 39.9 in adult ([place]) 05/30/2019  . Debility 03/13/2019  . Drug induced constipation   . Dyspnea   . Fatty liver disease, nonalcoholic   . GIB (gastrointestinal bleeding) 03/04/2019  . Heart failure, systolic, acute ([place]) 9/48/5462  . History of pulmonary embolism 08/12/2019  . Hyperlipidemia 01/24/2019  . Hypertension 01/24/2019  . Hypoalbuminemia due to protein-calorie malnutrition ([place])   . Hypokalemia   . Hyponatremia   . Hypotension due to drugs   . Insomnia 01/24/2019  . Lactic acidosis 08/23/2019  . Lumbar disc herniation 01/24/2019  . Mixed hyperlipidemia 01/24/2019  . Morbid obesity with BMI of 40.0-44.9, adult ([place]) 05/30/2019  . Normocytic anemia 06/08/2019  . Occasional tremors 08/12/2019  . OSA (obstructive  sleep apnea) 08/12/2019  . Palpitations 12/26/2019  . Peripheral edema   . Personal history of DVT (deep vein thrombosis) 12/26/2019  . Portal hypertensive gastropathy ([place]) 03/06/2019    Seen on EGD 03/06/2019  . Pulmonary emboli ([place]) 06/08/2019  . Pulmonary embolism ([place]) 06/08/2019  . Redness and swelling of lower leg 08/23/2019  . Shock ([place])   . Thrombocytopenia ([place])     Past Surgical History:  Procedure Laterality Date  . ESOPHAGOGASTRODUODENOSCOPY (EGD) WITH PROPOFOL N/A 03/05/2019   Procedure: ESOPHAGOGASTRODUODENOSCOPY (EGD) WITH PROPOFOL;  Surgeon: Doran Stabler, MD;  Location: [place];  Service: Gastroenterology;  Laterality: N/A;  Large Blood Clot Removed  . ESOPHAGOGASTRODUODENOSCOPY (EGD) WITH PROPOFOL N/A 03/06/2019   Procedure: ESOPHAGOGASTRODUODENOSCOPY (EGD) WITH PROPOFOL;  Surgeon: Thornton Park, MD;  Location: [place];  Service: Gastroenterology;  Laterality: N/A;  . HEMORROIDECTOMY    . RIGHT HEART CATH N/A 08/09/2019   Procedure: RIGHT HEART CATH;  Surgeon: Larey Dresser, MD;  Location: [place] [facility];  Service: Cardiovascular;  Laterality: N/A;  . RIGHT/LEFT HEART CATH AND CORONARY ANGIOGRAPHY N/A 05/10/2020   Procedure: RIGHT/LEFT HEART CATH AND CORONARY ANGIOGRAPHY;  Surgeon: Larey Dresser, MD;  Location: [place] [facility];  Service: Cardiovascular;  Laterality: N/A;     reports that he has never smoked. He has never used smokeless tobacco. He reports previous alcohol use. He reports previous drug use. Drug: Marijuana.  Allergies  Allergen Reactions  . Penicillins     Broke out in convulsions as a child  Did it involve swelling of the face/tongue/throat, SOB, or low BP? N/A Did it involve sudden or severe rash/hives, skin peeling, or any reaction on the inside of your mouth or nose? N/A Did you need to seek medical attention at a hospital or doctor's office?N/A When did it last happen?N/A If all above answers are "NO", may proceed with cephalosporin use.    Family History  Problem Relation Age of Onset  . Hyperlipidemia Mother   . Hypertension Mother   . Stroke Mother   . CAD Maternal Grandmother   . CAD  Maternal Grandfather      Prior to Admission medications   Medication Sig Start Date End Date Taking? Authorizing Provider  acetaminophen (TYLENOL) 500 MG tablet Take 1,000 mg by mouth every 6 (six) hours as needed for moderate pain or headache.    [provider]  apixaban (ELIQUIS) 5 MG TABS tablet Take 1 tablet (5 mg total) by mouth 2 (two) times daily. 08/12/19   Strader, Fransisco Hertz, PA-C  bumetanide (BUMEX) 1 MG tablet Take 4 tablets (4 mg total) by mouth daily with breakfast AND 3 tablets (3 mg total) every evening. 05/10/20   Larey Dresser, MD  Buprenorphine HCl-Naloxone HCl 8-2 MG FILM Place 1 tablet under the tongue in the morning and at bedtime.     [provider]  empagliflozin (JARDIANCE) 10 MG TABS tablet Take 1 tablet (10 mg total) by mouth daily before breakfast. Patient not taking: Reported on 05/28/2020 04/11/20   Larey Dresser, MD  eplerenone (INSPRA) 50 MG tablet Take 2 tablets (100 mg total) by mouth daily. 02/27/20   Clegg, [name] D, NP  gabapentin (NEURONTIN) 800 MG tablet Take 1 tablet (800 mg total) by mouth 3 (three) times daily. 03/23/19   Angiulli, Lavon Paganini, PA-C  metolazone (ZAROXOLYN) 2.5 MG tablet Take 1 tablet (2.5 mg total) by mouth 2 (two) times a week. Tuesdays and Fridays with extra KCL  32mq Patient not taking: No sig reported 04/11/20   MLarey Dresser MD  metoprolol succinate (TOPROL XL) 25 MG 24 hr tablet Take 1 tablet (25 mg total) by mouth daily. 04/11/20   MLarey Dresser MD  ondansetron (ZOFRAN ODT) 4 MG disintegrating tablet Take 1 tablet (4 mg total) by mouth every 8 (eight) hours as needed for nausea or vomiting. Patient not taking: No sig reported 04/24/20   MLarey Dresser MD  pantoprazole (PROTONIX) 40 MG tablet Take 40 mg by mouth 2 (two) times daily.    [provider]  polyethylene glycol (MIRALAX / GLYCOLAX) 17 g packet Take 17 g by mouth daily. Patient not taking: No sig reported 03/23/19   Angiulli, DLavon Paganini  PA-C  potassium chloride SA (KLOR-CON) 20 MEQ tablet Take 40 mEq by mouth See admin instructions. Take 40 meq twice daily on Sun, Mon, Wed, Thurs, and Sat. Take 40 meq 3 times daily on Tues and Fri (metolazone days)    [provider]  pravastatin (PRAVACHOL) 20 MG tablet Take 1 tablet (20 mg total) by mouth daily. 03/23/19   Angiulli, DLavon Paganini PA-C  sodium chloride (OCEAN) 0.65 % SOLN nasal spray Place 1 spray into both nostrils as needed for congestion.    [provider]  VENTOLIN HFA 108 (90 Base) MCG/ACT inhaler Inhale 1 puff into the lungs every 4 (four) hours as needed for shortness of breath. Patient taking differently: Inhale 1-2 puffs into the lungs every 4 (four) hours as needed for shortness of breath. 03/23/19   Angiulli, DLavon Paganini PA-C  zolpidem (AMBIEN) 10 MG tablet Take 10 mg by mouth at bedtime as needed for sleep.  Patient not taking: No sig reported 05/19/19   [provider]    Physical Exam: Vitals:   06/10/20 0130 06/10/20 0221 06/10/20 0224 06/10/20 0314  BP: 121/74  127/86 116/78  Pulse: 74  78 74  Resp: 15  17 20   Temp:   98.5 F (36.9 C) 97.6 F (36.4 C)  TempSrc:   Oral Oral  SpO2: 100%  99% 97%  Weight:  (!) 164.6 kg    Height:  6' 10"  (2.083 m)      Constitutional: Acute alert and oriented x3, patient is in mild respiratory distress.   Skin: no rashes, no lesions, multiple blisters noted over the anterior surface of the left lower extremity.  Notable hyperkeratinization of the skin of the anterior surfaces of the bilateral lower extremities.  Notable dry skin in general.   Eyes: Pupils are equally reactive to light.  No evidence of scleral icterus or conjunctival pallor.  ENMT: Moist mucous membranes noted.  Posterior pharynx clear of any exudate or lesions.   Neck: normal, supple, no masses, no thyromegaly.  No evidence of jugular venous distension.   Respiratory: Scattered rhonchi bilaterally with faint bibasilar rales.  No  evidence of wheezing.  Patient is somewhat tachypneic without accessory muscle use.  Cardiovascular: Regular rate and rhythm, no murmurs / rubs / gallops.  Pitting edema of the distal bilateral lower extremities.  2+ pedal pulses. No carotid bruits.  Chest:   Nontender without crepitus or deformity.   Back:   Nontender without crepitus or deformity. Abdomen: Generalized abdominal tenderness however abdomen is soft and nontender.  No evidence of intra-abdominal masses.  Positive bowel sounds noted in all quadrants.   Musculoskeletal: No joint deformity upper and lower extremities. Good ROM, no contractures. Normal muscle tone.  Neurologic: CN 2-12 grossly intact.  Sensation intact.  Patient moving all 4 extremities spontaneously.  Patient is following all commands.  Patient is responsive to verbal stimuli.   Psychiatric: Patient exhibits normal mood with appropriate affect.  Patient seems to possess insight as to their current situation.     Labs on Admission: I have personally reviewed following labs and imaging studies -   CBC: Recent Labs  Lab 06/09/20 2224  WBC 7.2  HGB 12.8*  HCT 44.6  MCV 86.1  PLT 46*   Basic Metabolic Panel: Recent Labs  Lab 06/09/20 2224  NA 134*  K 4.9  CL 95*  CO2 25  GLUCOSE 106*  BUN 15  CREATININE 0.92  CALCIUM 9.0   GFR: Estimated Creatinine Clearance: 144.8 mL/min (by C-G formula based on SCr of 0.92 mg/dL). Liver Function Tests: Recent Labs  Lab 06/09/20 2224  AST 55*  ALT 25  ALKPHOS 80  BILITOT 1.1  PROT 7.2  ALBUMIN 3.6   No results for input(s): LIPASE, AMYLASE in the last 168 hours. No results for input(s): AMMONIA in the last 168 hours. Coagulation Profile: No results for input(s): INR, PROTIME in the last 168 hours. Cardiac Enzymes: No results for input(s): CKTOTAL, CKMB, CKMBINDEX, TROPONINI in the last 168 hours. BNP (last 3 results) No results for input(s): PROBNP in the last 8760 hours. HbA1C: No results for input(s):  HGBA1C in the last 72 hours. CBG: No results for input(s): GLUCAP in the last 168 hours. Lipid Profile: No results for input(s): CHOL, HDL, LDLCALC, TRIG, CHOLHDL, LDLDIRECT in the last 72 hours. Thyroid Function Tests: No results for input(s): TSH, T4TOTAL, FREET4, T3FREE, THYROIDAB in the last 72 hours. Anemia Panel: No results for input(s): VITAMINB12, FOLATE, FERRITIN, TIBC, IRON, RETICCTPCT in the last 72 hours. Urine analysis:    Component Value Date/Time   COLORURINE YELLOW 01/31/2020 [place] 01/31/2020 1505   LABSPEC 1.009 01/31/2020 1505   PHURINE 7.0 01/31/2020 1505   GLUCOSEU NEGATIVE 01/31/2020 1505   HGBUR NEGATIVE 01/31/2020 1505   BILIRUBINUR NEGATIVE 01/31/2020 1505   KETONESUR NEGATIVE 01/31/2020 1505   PROTEINUR NEGATIVE 01/31/2020 1505   NITRITE NEGATIVE 01/31/2020 [place] 01/31/2020 1505    Radiological Exams on Admission - Personally Reviewed: DG Chest Port 1 View  Result Date: 06/09/2020 CLINICAL DATA:  Shortness of breath EXAM: PORTABLE CHEST 1 VIEW COMPARISON:  May 07, 2020 FINDINGS: Emphysematous changes are suspected. There are chronic appearing lung markings at the lung bases suggestive of scarring or atelectasis. There is no pneumothorax. No large pleural effusion. No focal infiltrate. The heart size is stable. IMPRESSION: No active disease. Electronically Signed   By: Constance Holster M.D.   On: 06/09/2020 22:00    EKG: Personally reviewed.  Rhythm is normal sinus rhythm with heart rate of 72 bpm.  Evidence of left anterior fascicular block.  No dynamic ST segment changes appreciated.  Assessment/Plan Principal Problem:   Acute on chronic right heart and diastolic heart failure ([place])   Patient reporting several day history of increasing lower extremity edema, worsening dyspnea on exertion paroxysmal nocturnal dyspnea and pillow orthopnea over 3-day span.   That being said, this is not accompanied by the  typical increase in weight that the patient usually presents with with volume overload  Concerning other potential etiologies of shortness of breath -Covid testing is pending and unlikely, chest x-ray reveals no evidence of focal pneumonia, patient is on chronic anticoagulation and therefore acute pulmonary embolism is unlikely as well.  Exam does reveal some peripheral edema and patient seems to already be responding to 120 mg of IV Lasix provided by the emergency department staff  I suspect that this is a very mild incidence of volume overload and will continue patient on Lasix 100 mg twice daily for now although this should be able to be rapidly discontinued if effective.  Providing spironolactone 50 mg daily, equivalent dosing of eplerenone    If ineffective, patient historically has responded extremely well to metolazone although he is very apprehensive about taking this medication stating that "I lose 15 pounds in one day if I take it."  Strict input and output monitoring  Daily weights  Monitoring renal function and electrolytes with serial chemistry  Active Problems:  Thrombocytopenia   Patient exhibiting substantial thrombocytopenia on this presentation of 46  No clinical evidence of bleeding  Temporarily holding home regimen of Eliquis  While patient has a known history of cirrhosis and very well could be the cause of the thrombocytopenia this is a dramatic drop to prior presentations (last was 156)  No clinical evidence of infection that would precipitate an exacerbation of the thrombocytopenia although urinalysis and Covid testing are still pending  HIT is extremely unlikely as patient only received 1 dose of heparin in late January with his catheterization  No other medications identified that are typical culprits in medication induced thrombocytopenia  This sudden worsening of thrombocytopenia warrants a discussion with hematology during the day   Mixed  hyperlipidemia  . Continuing home regimen of lipid lowering therapy.    Chronic pain syndrome   Continue home regimen of Suboxone    History of pulmonary embolism   Temporarily holding home regimen of anticoagulation due to substantial thrombocytopenia    GERD without esophagitis  Continuing home regimen of daily PPI therapy.    Cirrhosis of liver without ascites (HCC)   Documented prior history of cirrhosis  No physical evidence of ascites at this time  Other than thrombocytopenia there is no clinical evidence of hepatic decompensation    Benign essential HTN   Continue home regimen of metoprolol    OSA   CPAP nightly ordered however patient states that he is typically noncompliant with this    AF (paroxysmal atrial fibrillation) ([place])   Currently in sinus rhythm and rate controlled   Code Status:  Full code Family Communication: deferred   Status is: Observation  The patient remains OBS appropriate and will d/c before 2 midnights.  Dispo: The patient is from: Home              Anticipated d/c is to: Home              Patient currently is not medically stable to d/c.   Difficult to place patient No        Vernelle Emerald MD [facility] Pager [phone]  If 7PM-7AM, please contact night-coverage [email or web address] Use universal [place] password for that web site. If you do not have the password, please call the hospital operator.  06/10/2020, 3:29 AM

## 2020-06-10 NOTE — Progress Notes (Signed)
Visit made to patients room to help with CPAP set up.  Patient states he don't wear one at home and more than likely wouldn't here.  I advised patient if he changes his mind to have RN call me.

## 2020-06-11 ENCOUNTER — Telehealth (HOSPITAL_COMMUNITY): Payer: Self-pay

## 2020-06-11 LAB — CBC
HCT: 37.5 % — ABNORMAL LOW (ref 39.0–52.0)
Hemoglobin: 11.9 g/dL — ABNORMAL LOW (ref 13.0–17.0)
MCH: 25.7 pg — ABNORMAL LOW (ref 26.0–34.0)
MCHC: 31.7 g/dL (ref 30.0–36.0)
MCV: 81 fL (ref 80.0–100.0)
Platelets: 148 10*3/uL — ABNORMAL LOW (ref 150–400)
RBC: 4.63 MIL/uL (ref 4.22–5.81)
RDW: 16.1 % — ABNORMAL HIGH (ref 11.5–15.5)
WBC: 8.5 10*3/uL (ref 4.0–10.5)
nRBC: 0 % (ref 0.0–0.2)

## 2020-06-11 LAB — BASIC METABOLIC PANEL
Anion gap: 12 (ref 5–15)
BUN: 17 mg/dL (ref 8–23)
CO2: 31 mmol/L (ref 22–32)
Calcium: 9.3 mg/dL (ref 8.9–10.3)
Chloride: 94 mmol/L — ABNORMAL LOW (ref 98–111)
Creatinine, Ser: 0.96 mg/dL (ref 0.61–1.24)
GFR, Estimated: >60 mL/min (ref >60)
Glucose, Bld: 116 mg/dL — ABNORMAL HIGH (ref 70–99)
Potassium: 3.5 mmol/L (ref 3.5–5.1)
Sodium: 137 mmol/L (ref 135–145)

## 2020-06-11 LAB — MAGNESIUM: Magnesium: 2.4 mg/dL (ref 1.7–2.4)

## 2020-06-11 MED ORDER — FUROSEMIDE 10 MG/ML IJ SOLN
100.0000 mg | Freq: Two times a day (BID) | INTRAVENOUS | Status: AC
  Administered 2020-06-11: 100 mg via INTRAVENOUS
  Filled 2020-06-11: qty 10

## 2020-06-11 MED ORDER — POTASSIUM CHLORIDE CRYS ER 20 MEQ PO TBCR
40.0000 meq | EXTENDED_RELEASE_TABLET | Freq: Once | ORAL | Status: AC
  Administered 2020-06-11: 40 meq via ORAL
  Filled 2020-06-11: qty 2

## 2020-06-11 MED ORDER — POLYETHYLENE GLYCOL 3350 17 G PO PACK
17.0000 g | PACK | Freq: Every day | ORAL | Status: DC | PRN
  Administered 2020-06-13: 17 g via ORAL
  Filled 2020-06-11 (×2): qty 1

## 2020-06-11 NOTE — Evaluation (Signed)
Physical Therapy Evaluation Patient Details Name: Charles Gray MRN: [id number] DOB: 04-11-1956 Today's Date: 06/11/2020   History of Present Illness  Nixon Landi is a 65 year old male with obesity, mild COPD, [name], DVT/PE (05/2019, on Eliquis), paroxysmal AF, hypertension, untreated OSA, insomnia, coronary artery disease by CCTA, chronic pain, upper GI bleed (Fall 2020), and HFpEF. Presents with chest pain, SOB, and increased leg swelling. Admitted with acute on chronic diastolic CHF.  Clinical Impression  PTA, pt lives alone in a second floor apartment, is mainly a household ambulator with a walker and is independent with ADL's. Pt presents with decreased cardiopulmonary endurance. Ambulating x 160 feet with a walker at a supervision level, SpO2 97-99%, HR irregular 88-140 bpm. Pt reporting 6-8/10 on the Modified Borg Dyspnea Scale with mobility. Recommending HHPT to address deficits and maximize functional independence.     Follow Up Recommendations Home health PT;Supervision - Intermittent    Equipment Recommendations  None recommended by PT    Recommendations for Other Services       Precautions / Restrictions Precautions Precautions: Fall;Other (comment) Precaution Comments: watch HR Restrictions Weight Bearing Restrictions: No      Mobility  Bed Mobility Overal bed mobility: Independent                  Transfers Overall transfer level: Independent Equipment used: None                Ambulation/Gait Ambulation/Gait assistance: Supervision Gait Distance (Feet): 160 Feet Assistive device: Rolling walker (2 wheeled) Gait Pattern/deviations: Step-through pattern;Decreased stride length Gait velocity: decreased   General Gait Details: Supervision for safety, no gross instability noted. Cues for activity pacing  Stairs            Wheelchair Mobility    Modified Rankin (Stroke Patients Only)       Balance Overall balance assessment: Mild  deficits observed, not formally tested                                           Pertinent Vitals/Pain Pain Assessment: No/denies pain    Home Living Family/patient expects to be discharged to:: Private residence Living Arrangements: Alone Available Help at Discharge: Family;Available PRN/intermittently Type of Home: Apartment Home Access: Stairs to enter Entrance Stairs-Rails: Right;Left;Can reach both Entrance Stairs-Number of Steps: 16 steps Home Layout: One level Home Equipment: Tub bench;Walker - 2 wheels;Hand held shower head      Prior Function Level of Independence: Needs assistance   Gait / Transfers Assistance Needed: using walker  ADL's / Homemaking Assistance Needed: Orders groceries        Hand Dominance   Dominant Hand: Right    Extremity/Trunk Assessment   Upper Extremity Assessment Upper Extremity Assessment: Overall WFL for tasks assessed    Lower Extremity Assessment Lower Extremity Assessment: Overall WFL for tasks assessed    Cervical / Trunk Assessment Cervical / Trunk Assessment: Kyphotic  Communication   Communication: No difficulties  Cognition Arousal/Alertness: Awake/alert Behavior During Therapy: WFL for tasks assessed/performed Overall Cognitive Status: Within Functional Limits for tasks assessed                                        General Comments      Exercises     Assessment/Plan  PT Assessment Patient needs continued PT services  PT Problem List Decreased strength;Decreased activity tolerance;Decreased balance;Decreased mobility;Cardiopulmonary status limiting activity;Obesity       PT Treatment Interventions DME instruction;Gait training;Stair training;Functional mobility training;Therapeutic activities;Therapeutic exercise;Balance training;Patient/family education    PT Goals (Current goals can be found in the Care Plan section)  Acute Rehab PT Goals Patient Stated Goal:  improved breathing PT Goal Formulation: With patient Time For Goal Achievement: 06/25/20 Potential to Achieve Goals: Good    Frequency Min 3X/week   Barriers to discharge        Co-evaluation               AM-PAC PT "6 Clicks" Mobility  Outcome Measure Help needed turning from your back to your side while in a flat bed without using bedrails?: None Help needed moving from lying on your back to sitting on the side of a flat bed without using bedrails?: None Help needed moving to and from a bed to a chair (including a wheelchair)?: None Help needed standing up from a chair using your arms (e.g., wheelchair or bedside chair)?: None Help needed to walk in hospital room?: None Help needed climbing 3-5 steps with a railing? : A Little 6 Click Score: 23    End of Session   Activity Tolerance: Patient tolerated treatment well Patient left: in bed;with call bell/phone within reach Nurse Communication: Mobility status PT Visit Diagnosis: Difficulty in walking, not elsewhere classified (R26.2)    Time: 3419-3790 PT Time Calculation (min) (ACUTE ONLY): 22 min   Charges:   PT Evaluation $PT Eval Moderate Complexity: 1 Mod          Wyona Almas, PT, DPT Acute Rehabilitation Services Pager [phone] Office [phone]   Deno Etienne 06/11/2020, 11:07 AM

## 2020-06-11 NOTE — Telephone Encounter (Addendum)
Late entry: 2/25 @ 1630.  Pt texting HF navigator phone regarding "feel so bad today" asking if pt should take more metolazone and "I just can't do this anymore". Navigator called pt for clarification, asked patient what he cannot do anymore, and if he has thoughts/plans for self harm. Pt stated "I wouldn't do that, I just feel so bad and I can't sit in the ER waiting room for 12-14 hours." "Took 1/2 metolazone, but haven't peed much today, pee is dark and legs seem more swollen"  Informed pt of scope of practice for RN. Navigator encouraged pt to call HF clinic triage for guidance with medications and symptoms. Instructed if he has trouble breathing to call 911/EMS. Gave social encouragement, asked pt to call son for support as well. Explained to pt the navigator phone turns off Fridays at [place], RN, BSN Heart Failure Nurse Navigator [phone]

## 2020-06-11 NOTE — TOC Progression Note (Signed)
Transition of Care (TOC) - Progression Note Heart Failure   Patient Details  Name: Charles Gray MRN: [id number] Date of Birth: Aug 05, 1955  Transition of Care [facility]) CM/SW [place], [place] Phone Number: 06/11/2020, 2:18 PM  Clinical Narrative:    CSW completed SDOH with [name] and he scored relatively high on the PHQ-9 for depression. CSW spoke with patient about an outpatient therapy referral and if he would be willing to talk with someone. Patient declined mental health resources and reported that he can't afford another appointment to go to including struggling with transportation and even after further discussion about options for support patient continued to refuse help.   CSW will continue to follow and suggested patient reach out if he changes his mind.         Expected Discharge Plan and Services                                                 Social Determinants of Health (SDOH) Interventions Financial Strain Interventions: Intervention Not Indicated Housing Interventions: Intervention Not Indicated Depression Interventions/Treatment : Patient refuses Treatment  Readmission Risk Interventions Readmission Risk Prevention Plan 03/27/2020 02/02/2020 08/28/2019  Transportation Screening Complete Complete Complete  HRI or [place] for [place] Planning/Counseling - - -  Medication Review Press photographer) Complete Complete Complete  PCP or Specialist appointment within 3-5 days of discharge Complete - -  [place] or [place] Complete Complete -  SW Recovery Care/Counseling Consult Complete Complete Complete  Palliative Care Screening Not Applicable Not Applicable Not [place] Not Applicable Not Applicable Not Applicable   Denese Mentink, MSW, [place] Heart Failure Social Worker

## 2020-06-11 NOTE — Progress Notes (Addendum)
Advanced Heart Failure Rounding Note  PCP-Cardiologist: Kardie Tobb, DO   Subjective:    Breathing a bit better. Main complaint is poor sleep last night due to chronic low back and leg pain.   Good UOP w/ IV Lasix yesterday -2.9L. Wt down 2 lb. SCr stable, 0.96. K 3.5.   Plts 158 and 148 on repeat labs draws.    Objective:   Weight Range: (!) 163.5 kg Body mass index is 37.69 kg/m.   Vital Signs:   Temp:  [97.6 F (36.4 C)-98.7 F (37.1 C)] 98 F (36.7 C) (03/01 0749) Pulse Rate:  [62-68] 66 (03/01 0749) Resp:  [15-20] 20 (03/01 0749) BP: (109-137)/(67-79) 120/68 (03/01 0749) SpO2:  [97 %-100 %] 99 % (03/01 0749) Weight:  [163.5 kg] 163.5 kg (03/01 0030) Last BM Date: 06/09/20  Weight change: Filed Weights   06/09/20 2102 06/10/20 0221 06/11/20 0030  Weight: (!) 165.6 kg (!) 164.6 kg (!) 163.5 kg    Intake/Output:   Intake/Output Summary (Last 24 hours) at 06/11/2020 0802 Last data filed at 06/11/2020 0031 Gross per 24 hour  Intake 1146 ml  Output 2850 ml  Net -1704 ml      Physical Exam    General:  Well appearing, obese. No resp difficulty HEENT: Normal Neck: Supple. Thick neck, JVP not well visualized . Carotids 2+ bilat; no bruits. No lymphadenopathy or thyromegaly appreciated. Cor: PMI nondisplaced. Regular rate & rhythm. No rubs, gallops or murmurs. Lungs: Clear Abdomen: obese, soft, nontender, nondistended. No hepatosplenomegaly. No bruits or masses. Good bowel sounds. + truncal edema  Extremities: No cyanosis, clubbing, rash, trace bilateral ankle/pedal edema Neuro: Alert & orientedx3, cranial nerves grossly intact. moves all 4 extremities w/o difficulty. Affect pleasant   Telemetry   SR/ Sinus arrhythmia 70s-80s  EKG    No new EKG to review   Labs    CBC Recent Labs    06/10/20 1243 06/11/20 0342  WBC 8.8 8.5  HGB 12.8* 11.9*  HCT 41.2 37.5*  MCV 80.6 81.0  PLT 158 169*   Basic Metabolic Panel Recent Labs    06/10/20 0259  06/11/20 0342  NA 136 137  K 3.7 3.5  CL 95* 94*  CO2 30 31  GLUCOSE 108* 116*  BUN 13 17  CREATININE 0.95 0.96  CALCIUM 9.4 9.3  MG 2.3 2.4   Liver Function Tests Recent Labs    06/09/20 2224  AST 55*  ALT 25  ALKPHOS 80  BILITOT 1.1  PROT 7.2  ALBUMIN 3.6   No results for input(s): LIPASE, AMYLASE in the last 72 hours. Cardiac Enzymes No results for input(s): CKTOTAL, CKMB, CKMBINDEX, TROPONINI in the last 72 hours.  BNP: BNP (last 3 results) Recent Labs    04/16/20 1553 05/07/20 0140 06/10/20 0259  BNP 38.9 351.2* 41.2    ProBNP (last 3 results) No results for input(s): PROBNP in the last 8760 hours.   D-Dimer No results for input(s): DDIMER in the last 72 hours. Hemoglobin A1C No results for input(s): HGBA1C in the last 72 hours. Fasting Lipid Panel No results for input(s): CHOL, HDL, LDLCALC, TRIG, CHOLHDL, LDLDIRECT in the last 72 hours. Thyroid Function Tests No results for input(s): TSH, T4TOTAL, T3FREE, THYROIDAB in the last 72 hours.  Invalid input(s): FREET3  Other results:   Imaging     No results found.   Medications:     Scheduled Medications: . apixaban  5 mg Oral BID  . buprenorphine-naloxone  1 tablet Sublingual  BID  . empagliflozin  10 mg Oral Daily  . gabapentin  800 mg Oral TID  . metoprolol succinate  25 mg Oral BID  . pantoprazole  40 mg Oral BID  . pravastatin  20 mg Oral QHS  . sertraline  25 mg Oral Daily  . sodium chloride flush  3 mL Intravenous Q12H  . spironolactone  50 mg Oral Daily     Infusions: . sodium chloride    . furosemide 100 mg (06/10/20 1653)     PRN Medications:  sodium chloride, acetaminophen, albuterol, ondansetron (ZOFRAN) IV, sodium chloride, sodium chloride flush, zolpidem   Assessment/Plan   1. Acute on chronic diastolic CHF: Echo in 65/68 with EF 12-75%, grade 2 diastolic dysfunction, RV poorly visualized. LHC/RHC in 1/22 showed no significant CAD, mildly elevated right heart  filling pressures. Patient has had multiple admissions with CHF. I suspect that obesity and deconditioning as well as anxiety play a large role in his symptomatology.  He has had a couple doses of IV Lasix w/ good UOP, remains mildly fluid overloaded on exam. SCr/K stable  - Will give another dose of 100 mg IV lasix this am and assess response. Likely transition to PO diuretics this afternoon or tomorrow  - When he resumes po diuretics, will transition to torsemide 60 mg bid.  He will continue metolazone 2.5 mg once weekly (has only been taking once a week).  - Ideally, would have Cardiomems placed to help guide treatment. However, he is very nervous about placement as it cannot be removed.  Still not ready to have it placed. -ContinueJardiance 10 mg daily for HFpEF.  - Continue spironolactone 50 mg daily.  -Continue Toprol XL 25 mg bid (dose increased) 2. H/o PE/DVT: Suspect due to sedentary lifestyle. Occurred in 2/21. V/Q scan in 6/21 did not show evidence for chronic PE.  - Continue apixaban.No bleeding issues. 3. OSA: Cannot tolerate CPAP.  4. Anxiety/panic: Very anxious in general.  -  sertraline 25 mg daily ordered but pt refused 5. Atrial fibrillation: Paroxysmal.He is in NSR.  - Continue apixaban. 6. Deconditioning: I think that a lot of his dyspnea and palpitations can be explained by deconditioning. Will arrange for PT consult and cardiac rehab.  7.Thrombocytopenia: Plts down to 46K on admit, unsure cause of drop.  No heparin products, has been normal in past.  Suspect lab error. Serial repeat CBCs plt ct 158>>148.  - continue to follow, can restart Eliquis.    Length of Stay: [place], PA-C  06/11/2020, 8:02 AM  Advanced Heart Failure Team Pager [phone] (M-F; 7a - 4p)  Please contact [facility] for night-coverage after hours (4p -7a ) and weekends on [email or web address]  Patient seen with PA, agree with the above note.   He feels like his breathing is  improving but still not back to baseline. Creatinine stable. Weight trending down.   General: NAD Neck: Thick, JVP difficult (?8-9), no thyromegaly or thyroid nodule.  Lungs: Clear to auscultation bilaterally with normal respiratory effort. CV: Nondisplaced PMI.  Heart regular S1/S2, no S3/S4, no murmur.  Trace ankle edema.  Abdomen: Soft, nontender, no hepatosplenomegaly, no distention.  Skin: Intact without lesions or rashes.  Neurologic: Alert and oriented x 3.  Psych: Normal affect. Extremities: No clubbing or cyanosis.  HEENT: Normal.   Suspect mild residual volume overload. Lasix 100 mg IV bid today, to torsemide 60 mg po bid tomorrow.  Continue spironolactone.  He says that he cannot tolerate Jardiance.  He does not want a Cardiomems device.   Platelets normal on repeat CBC, low plt count was spurious.   Loralie Champagne 06/11/2020 1:41 PM

## 2020-06-11 NOTE — Progress Notes (Signed)
PROGRESS NOTE    Charles Gray  ITG:[id number] DOB: [birth year] DOA: 06/09/2020 PCP: Enid Skeens., MD    Brief Narrative:  Charles Gray is a 65 year old male with past medical history significant for chronic diastolic congestive heart failure, right-sided heart failure, paroxysmal atrial fibrillation on Eliquis, history of thromboembolic disease, hyperlipidemia, essential hypertension, obesity, obstructive sleep apnea, GERD and cirrhosis who presented to [facility] ED via EMS for progressive shortness of breath. Onset 3 days ago with worsening associated with bilateral lower extremity edema. Now reports multiple blisters on lower extremities following worsening edema. Patient also reports paroxysmal nocturnal dyspnea. Denies chest pain, no cough, no fevers or sick contacts, no recent travel or contact confirmed with Covid-19 infected individual.  In the ED, temperature 99 F, HR 80, RR 16, BP 122/73, SPO2 98% on room air. Sodium 134, potassium 4.9, chloride 95, CO2 25, glucose 106, BUN 15, creatinine 0.92, AST 55, ALT 25, total bilirubin 1.1. WBC 7.2, hemoglobin 12.8, platelets 46.Covid-19/SARS-CoV-2 negative. Chest x-ray with no acute cardiopulmonary disease process. EKG with normal sinus rhythm, rate 72, normal QTC, no concerning ST elevation/depression or T wave inversion. Patient was given 120 mg IV Lasix in the ED. Hospitalist service consulted for further evaluation and management of acute on chronic CHF exacerbation.   Assessment & Plan:   Principal Problem:   Acute on chronic right heart failure (HCC) Active Problems:   Mixed hyperlipidemia   Cirrhosis of liver without ascites (HCC)   Chronic pain syndrome   History of pulmonary embolism   OSA (obstructive sleep apnea)   Acute on chronic diastolic CHF (congestive heart failure) (HCC)   AF (paroxysmal atrial fibrillation) (HCC)   Essential hypertension   GERD without esophagitis   Acute on chronic diastolic congestive  heart failure  Right-sided heart failure Patient presenting to the ED with progressive shortness of breath associated with orthopnea and lower extremity edema over the past 3 days. EKG with NSR, HR 72 with no concerning ST elevation/depression or T wave inversions. Chest x-ray with no acute cardiopulmonary disease process. BNP 41.2. High-sensitivity troponin 7, within normal limits. TTE 03/2020 with LVEF 60 to 65%, RV not well visualized, grade 2 diastolic dysfunction. Follows with cardiology outpatient, has been offered CardioMEMS but has refused. Home regimen includes Bumex 4 mg qam and 51m qpm, metoprolol succinate 25 mg p.o. daily, and metolazone 2.5 mg 2 times weekly. --Cardiology following, appreciate assistance --net negative 1.7L past 24h, net negative 1.5L since admission --Wt 165.6>163.5kg --Furosemide 100 mg IV q12h; Cards plan to convert to PO this afternoon vs tomorrow --Spironolactone 50 mg PO daily --Strict I's and O's and daily weights --Follow BMP daily  Thrombocytopenia Platelets 46 on presentation, no clinical evidence of blood loss/bleeding. Patient with underlying history of cirrhosis; but significant drop from previous platelet level of 156. --repeat Plt [id number] ? Lab error --restarted home Eliquis --repeat CBC in am  Paroxysmal atrial fibrillation Essential hypertension --Metoprolol succinate increased to 25 mg p.o. BID --Eliquis 5 mg p.o. twice daily --Continue to monitor on telemetry  OSA --Nocturnal CPAP, but likely noncompliant  Chronic pain syndrome --Continue home Suboxone 8-266mSL BID --Gabapentin 800 mg p.o. 3 times daily  History of PE --Holding home Eliquis secondary to thrombocytopenia as above  GERD without esophagitis: Continue PPI  Anxiety/depression: --Sertraline 25 mg p.o. daily  Hyperlipidemia: Pravastatin 20 mg p.o. daily  Obese Body mass index is 37.69 kg/m. Discussed with patient needs for aggressive lifestyle changes/weight  loss as this complicates all facets  of care.  Outpatient follow-up with PCP.    Weakness/debility/deconditioning: --PT recommends home health --OT: Pending  DVT prophylaxis: Eliquis   Code Status: Full Code Family Communication: No family present at bedside this morning  Disposition Plan:  Level of care: Telemetry Cardiac Status is: Inpatient  Remains inpatient appropriate because:Ongoing diagnostic testing needed not appropriate for outpatient work up, Unsafe d/c plan, IV treatments appropriate due to intensity of illness or inability to take PO and Inpatient level of care appropriate due to severity of illness   Dispo: The patient is from: Home              Anticipated d/c is to: Home              Patient currently is medically stable to d/c.   Difficult to place patient No   Consultants:   Cardiology, Dr. [name]  Procedures:   None  Antimicrobials:   None   Subjective: Patient seen and examined at bedside, resting comfortably sitting at edge of bed.  Continues with mild weakness, especially with ambulation associated with increased shortness of breath.  No other complaints or concerns at this time. Denies headache, no fever/chills/night sweats, no nausea/vomiting/diarrhea, no chest pain, no palpitations, no weakness, no fatigue, no paresthesias. No acute events overnight per nursing staff.  Objective: Vitals:   06/11/20 0030 06/11/20 0339 06/11/20 0749 06/11/20 1211  BP: 114/71 109/74 120/68 105/77  Pulse: 68 64 66 61  Resp: 17 15 20 20   Temp: 98.7 F (37.1 C) 98.5 F (36.9 C) 98 F (36.7 C) (!) 97.3 F (36.3 C)  TempSrc: Oral Oral  Oral  SpO2: 97% 100% 99% 97%  Weight: (!) 163.5 kg     Height:        Intake/Output Summary (Last 24 hours) at 06/11/2020 1240 Last data filed at 06/11/2020 0915 Gross per 24 hour  Intake 1146 ml  Output 2250 ml  Net -1104 ml   Filed Weights   06/09/20 2102 06/10/20 0221 06/11/20 0030  Weight: (!) 165.6 kg (!) 164.6 kg (!)  163.5 kg    Examination:  General exam: Appears calm and comfortable, obese, ill in appearance Respiratory system: Clear to auscultation. Respiratory effort normal. Oxygenating well on room air Cardiovascular system: S1 & S2 heard, RRR. No JVD, murmurs, rubs, gallops or clicks. No pedal edema. Gastrointestinal system: Abdomen is nondistended, soft and nontender. No organomegaly or masses felt. Normal bowel sounds heard. Central nervous system: Alert and oriented. No focal neurological deficits. Extremities: Symmetric 5 x 5 power. Skin: No rashes, lesions or ulcers Psychiatry: Judgement and insight appear poor. Mood & affect appropriate.     Data Reviewed: I have personally reviewed following labs and imaging studies  CBC: Recent Labs  Lab 06/09/20 2224 06/10/20 1243 06/11/20 0342  WBC 7.2 8.8 8.5  HGB 12.8* 12.8* 11.9*  HCT 44.6 41.2 37.5*  MCV 86.1 80.6 81.0  PLT 46* 158 341*   Basic Metabolic Panel: Recent Labs  Lab 06/09/20 2224 06/10/20 0259 06/11/20 0342  NA 134* 136 137  K 4.9 3.7 3.5  CL 95* 95* 94*  CO2 25 30 31   GLUCOSE 106* 108* 116*  BUN 15 13 17   CREATININE 0.92 0.95 0.96  CALCIUM 9.0 9.4 9.3  MG  --  2.3 2.4   GFR: Estimated Creatinine Clearance: 138.3 mL/min (by C-G formula based on SCr of 0.96 mg/dL). Liver Function Tests: Recent Labs  Lab 06/09/20 2224  AST 55*  ALT 25  ALKPHOS 80  BILITOT 1.1  PROT 7.2  ALBUMIN 3.6   No results for input(s): LIPASE, AMYLASE in the last 168 hours. No results for input(s): AMMONIA in the last 168 hours. Coagulation Profile: No results for input(s): INR, PROTIME in the last 168 hours. Cardiac Enzymes: No results for input(s): CKTOTAL, CKMB, CKMBINDEX, TROPONINI in the last 168 hours. BNP (last 3 results) No results for input(s): PROBNP in the last 8760 hours. HbA1C: No results for input(s): HGBA1C in the last 72 hours. CBG: No results for input(s): GLUCAP in the last 168 hours. Lipid Profile: No  results for input(s): CHOL, HDL, LDLCALC, TRIG, CHOLHDL, LDLDIRECT in the last 72 hours. Thyroid Function Tests: No results for input(s): TSH, T4TOTAL, FREET4, T3FREE, THYROIDAB in the last 72 hours. Anemia Panel: No results for input(s): VITAMINB12, FOLATE, FERRITIN, TIBC, IRON, RETICCTPCT in the last 72 hours. Sepsis Labs: No results for input(s): PROCALCITON, LATICACIDVEN in the last 168 hours.  Recent Results (from the past 240 hour(s))  SARS CORONAVIRUS 2 (TAT 6-24 HRS) Nasopharyngeal Nasopharyngeal Swab     Status: None   Collection Time: 06/10/20  1:12 AM   Specimen: Nasopharyngeal Swab  Result Value Ref Range Status   SARS Coronavirus 2 NEGATIVE NEGATIVE Final    Comment: (NOTE) SARS-CoV-2 target nucleic acids are NOT DETECTED.  The SARS-CoV-2 RNA is generally detectable in upper and lower respiratory specimens during the acute phase of infection. Negative results do not preclude SARS-CoV-2 infection, do not rule out co-infections with other pathogens, and should not be used as the sole basis for treatment or other patient management decisions. Negative results must be combined with clinical observations, patient history, and epidemiological information. The expected result is Negative.  Fact Sheet for Patients: SugarRoll.be  Fact Sheet for Healthcare Providers: https://www.woods-mathews.com/  This test is not yet approved or cleared by the [place] FDA and  has been authorized for detection and/or diagnosis of SARS-CoV-2 by FDA under an Emergency Use Authorization (EUA). This EUA will remain  in effect (meaning this test can be used) for the duration of the COVID-19 declaration under Se ction 564(b)(1) of the Act, 21 U.S.C. section 360bbb-3(b)(1), unless the authorization is terminated or revoked sooner.  Performed at [facility], [place] [address] [place], [place] [address]          Radiology Studies: DG  Chest Port 1 View  Result Date: 06/09/2020 CLINICAL DATA:  Shortness of breath EXAM: PORTABLE CHEST 1 VIEW COMPARISON:  May 07, 2020 FINDINGS: Emphysematous changes are suspected. There are chronic appearing lung markings at the lung bases suggestive of scarring or atelectasis. There is no pneumothorax. No large pleural effusion. No focal infiltrate. The heart size is stable. IMPRESSION: No active disease. Electronically Signed   By: Constance Holster M.D.   On: 06/09/2020 22:00        Scheduled Meds: . apixaban  5 mg Oral BID  . buprenorphine-naloxone  1 tablet Sublingual BID  . gabapentin  800 mg Oral TID  . metoprolol succinate  25 mg Oral BID  . pantoprazole  40 mg Oral BID  . pravastatin  20 mg Oral QHS  . sodium chloride flush  3 mL Intravenous Q12H  . spironolactone  50 mg Oral Daily   Continuous Infusions: . sodium chloride    . furosemide 100 mg (06/11/20 1036)     LOS: 1 day    Time spent: 38 minutes spent on chart review, discussion with nursing staff, consultants, updating family and interview/physical exam;  more than 50% of that time was spent in counseling and/or coordination of care.    Eldine Rencher J [place] DO [facility] Available via Epic secure chat 7am-7pm After these hours, please refer to coverage provider listed on [email or web address] 06/11/2020, 12:40 PM

## 2020-06-11 NOTE — Progress Notes (Signed)
CARDIAC REHAB PHASE I   PRE:  Rate/Rhythm: 70 SR    BP: sitting 121/84    SaO2: 98 RA  MODE:  Ambulation: 220 ft   POST:  Rate/Rhythm: 125 ? afib with PVC    BP: sitting 110/71     SaO2: 98 RA  Pt ambulated to bathroom then in hall with RW. Slow, fairly steady. C/o SOB and general body fatigue. HR clearly NSR at rest but does have bouts of ? afib during walking, usually only a couple seconds at a time. To recliner, noted BP lower after walk than before. Gave pt HF booklet to review and also HF video. Will f/u for more ed. [id number]   [place], ACSM 06/11/2020 2:02 PM

## 2020-06-11 NOTE — Progress Notes (Signed)
  Prior to admit he was supposed to be taking jardiance but he has not been taking.   He refuses to take jardiance. He was explained benefit but he refuses.   I will discontinue jardiance.   Rayshaun Needle NP-C  10:11 AM

## 2020-06-12 LAB — CBC
HCT: 41.4 % (ref 39.0–52.0)
Hemoglobin: 13 g/dL (ref 13.0–17.0)
MCH: 25.5 pg — ABNORMAL LOW (ref 26.0–34.0)
MCHC: 31.4 g/dL (ref 30.0–36.0)
MCV: 81.2 fL (ref 80.0–100.0)
Platelets: 139 10*3/uL — ABNORMAL LOW (ref 150–400)
RBC: 5.1 MIL/uL (ref 4.22–5.81)
RDW: 15.9 % — ABNORMAL HIGH (ref 11.5–15.5)
WBC: 6.8 10*3/uL (ref 4.0–10.5)
nRBC: 0 % (ref 0.0–0.2)

## 2020-06-12 LAB — BASIC METABOLIC PANEL
Anion gap: 11 (ref 5–15)
BUN: 19 mg/dL (ref 8–23)
CO2: 29 mmol/L (ref 22–32)
Calcium: 9.3 mg/dL (ref 8.9–10.3)
Chloride: 95 mmol/L — ABNORMAL LOW (ref 98–111)
Creatinine, Ser: 0.91 mg/dL (ref 0.61–1.24)
GFR, Estimated: >60 mL/min (ref >60)
Glucose, Bld: 98 mg/dL (ref 70–99)
Potassium: 3.7 mmol/L (ref 3.5–5.1)
Sodium: 135 mmol/L (ref 135–145)

## 2020-06-12 LAB — GLUCOSE, CAPILLARY: Glucose-Capillary: 112 mg/dL — ABNORMAL HIGH (ref 70–99)

## 2020-06-12 LAB — MAGNESIUM: Magnesium: 2.4 mg/dL (ref 1.7–2.4)

## 2020-06-12 MED ORDER — POLYETHYLENE GLYCOL 3350 17 G PO PACK
17.0000 g | PACK | Freq: Once | ORAL | Status: AC
  Administered 2020-06-12: 17 g via ORAL
  Filled 2020-06-12: qty 1

## 2020-06-12 MED ORDER — SENNOSIDES-DOCUSATE SODIUM 8.6-50 MG PO TABS
2.0000 | ORAL_TABLET | Freq: Once | ORAL | Status: AC
  Administered 2020-06-12: 2 via ORAL
  Filled 2020-06-12: qty 2

## 2020-06-12 MED ORDER — POTASSIUM CHLORIDE CRYS ER 20 MEQ PO TBCR
40.0000 meq | EXTENDED_RELEASE_TABLET | Freq: Once | ORAL | Status: AC
  Administered 2020-06-12: 40 meq via ORAL
  Filled 2020-06-12: qty 2

## 2020-06-12 MED ORDER — TORSEMIDE 20 MG PO TABS
60.0000 mg | ORAL_TABLET | Freq: Two times a day (BID) | ORAL | Status: DC
  Administered 2020-06-12 – 2020-06-13 (×3): 60 mg via ORAL
  Filled 2020-06-12 (×3): qty 3

## 2020-06-12 MED ORDER — MAGNESIUM CITRATE PO SOLN
1.0000 | Freq: Once | ORAL | Status: AC
  Administered 2020-06-12: 1 via ORAL
  Filled 2020-06-12: qty 296

## 2020-06-12 NOTE — TOC Initial Note (Addendum)
Transition of Care [facility]) - Initial/Assessment Note    Patient Details  Name: Charles Gray MRN: [id number] Date of Birth: 1955-07-23  Transition of Care [facility]) CM/SW Contact:    Zenon Mayo, RN Phone Number: 06/12/2020, 5:06 PM  Clinical Narrative:                 NCM spoke with patient, he lives alone, he has a Therapist, sports with paramedicine, he has a scale, bp cuff, a walker, pulse ox and a shower chair.  He states he also has been ok'd to have PCS services for 28 hrs, but has not found an agency yet.  He does not have a preference for agency for HHPT. NCM made referral to [facility] with Alvis Lemmings ,awatiing to hear back.  NCM informed by Tommi Rumps with Alvis Lemmings that he can take referral for HHPT. Soc will begin 24 to 48 hrs post dc. Patient uses [facility] for transportation.   Expected Discharge Plan: [place] Barriers to Discharge: Continued Medical Work up   Patient Goals and CMS Choice Patient states their goals for this hospitalization and ongoing recovery are:: get better CMS Medicare.gov Compare Post Acute Care list provided to:: Patient Choice offered to / list presented to : Patient  Expected Discharge Plan and Services Expected Discharge Plan: [place] In-house Referral: NA Discharge Planning Services: CM Consult Post Acute Care Choice: Home Health                     DME Agency: NA       HH Arranged: PT          Prior Living Arrangements/Services   Lives with:: Self Patient language and need for interpreter reviewed:: Yes Do you feel safe going back to the place where you live?: Yes      Need for Family Participation in Patient Care: No (Comment) Care giver support system in place?: No (comment) Current home services: DME (has walker, shower chair, scale and bp cuff,) Criminal Activity/Legal Involvement Pertinent to Current Situation/Hospitalization: No - Comment as needed  Activities of Daily Living Home Assistive  Devices/Equipment: Shower chair with back,Walker (specify type) ADL Screening (condition at time of admission) Patient's cognitive ability adequate to safely complete daily activities?: Yes Is the patient deaf or have difficulty hearing?: No Does the patient have difficulty seeing, even when wearing glasses/contacts?: Yes Does the patient have difficulty concentrating, remembering, or making decisions?: No Patient able to express need for assistance with ADLs?: Yes Does the patient have difficulty dressing or bathing?: No Independently performs ADLs?: Yes (appropriate for developmental age) Does the patient have difficulty walking or climbing stairs?: Yes Weakness of Legs: Both Weakness of Arms/Hands: None  Permission Sought/Granted                  Emotional Assessment   Attitude/Demeanor/Rapport: Engaged Affect (typically observed): Appropriate Orientation: : Oriented to Self,Oriented to Place,Oriented to  Time,Oriented to Situation Alcohol / Substance Use: Not Applicable Psych Involvement: No (comment)  Admission diagnosis:  Acute right-sided congestive heart failure (HCC) [I50.811] Peripheral edema [R60.9] Acute on chronic right heart failure ([place]) [I50.813] Patient Active Problem List   Diagnosis Date Noted  . GERD without esophagitis 06/10/2020  . Atrial fibrillation with rapid ventricular response ([place]) 03/24/2020  . Acute on chronic right heart failure ([place]) 01/27/2020  . Acute respiratory failure with hypoxia ([place])   . Fatty liver disease, nonalcoholic   . Shock ([place])   .  Personal history of DVT (deep vein thrombosis) 12/26/2019  . Chronic anticoagulation 12/26/2019  . Palpitations 12/26/2019  . AF (paroxysmal atrial fibrillation) ([place]) 12/23/2019  . Heart failure, systolic, acute ([place]) 01/00/7121  . Acute on chronic congestive heart failure ([place])   . Acute on chronic diastolic CHF (congestive heart failure) ([place]) 08/23/2019  . Redness and swelling of lower leg  08/23/2019  . Chest pain 08/23/2019  . Lactic acidosis 08/23/2019  . History of pulmonary embolism 08/12/2019  . Atrial tachycardia ([place]) 08/12/2019  . Occasional tremors 08/12/2019  . OSA (obstructive sleep apnea) 08/12/2019  . Acute on chronic right-sided heart failure ([place]) 08/06/2019  . Pulmonary embolism ([place]) 06/08/2019  . Normocytic anemia 06/08/2019  . Pulmonary emboli ([place]) 06/08/2019  . Chronic heart failure with preserved ejection fraction ([place]) 05/30/2019  . Class 2 severe obesity due to excess calories with serious comorbidity and body mass index (BMI) of 39.0 to 39.9 in adult ([place]) 05/30/2019  . Morbid obesity with BMI of 40.0-44.9, adult ([place]) 05/30/2019  . Hyponatremia   . Drug induced constipation   . Peripheral edema   . Hypotension due to drugs   . Hypoalbuminemia due to protein-calorie malnutrition ([place])   . Hypokalemia   . Thrombocytopenia ([place])   . Anemia of chronic disease   . Cirrhosis of liver without ascites ([place])   . Chronic pain syndrome   . Debility 03/13/2019  . Portal hypertensive gastropathy ([place]) 03/06/2019  . Dyspnea   . GIB (gastrointestinal bleeding) 03/04/2019  . Mixed hyperlipidemia 01/24/2019  . Insomnia 01/24/2019  . Hyperlipidemia 01/24/2019  . Essential hypertension 01/24/2019  . Lumbar disc herniation 01/24/2019   PCP:  Enid Skeens., MD Pharmacy:   [facility], [address] [place] [address] Phone: [phone] Fax: [place], [place] - [address] [place] [address] Phone: [phone] Fax: [place], [place] - [address] [facility] [address] [place] [address] Phone: [phone] Fax: [phone]     Social Determinants of Health (SDOH) Interventions Financial Strain Interventions: Intervention Not Indicated Housing  Interventions: Intervention Not Indicated Depression Interventions/Treatment : Patient refuses Treatment  Readmission Risk Interventions Readmission Risk Prevention Plan 06/12/2020 03/27/2020 02/02/2020  Transportation Screening Complete Complete Complete  HRI or [place] for [place] Planning/Counseling - - -  Medication Review Press photographer) Complete Complete Complete  PCP or Specialist appointment within 3-5 days of discharge - Complete -  [place] or [place] Complete Complete Complete  SW Recovery Care/Counseling Consult Complete Complete Complete  Palliative Care Screening Not Applicable Not Applicable Not [place] Not Applicable Not Applicable Not Applicable

## 2020-06-12 NOTE — Progress Notes (Signed)
CARDIAC REHAB PHASE I   PRE:  Rate/Rhythm: 81 SR    BP: sitting 114/73    SaO2: 99 RA  MODE:  Ambulation: 220 ft   POST:  Rate/Rhythm: 99     BP: sitting 131/68     SaO2: 98 RA  Discussed HF booklet and low sodium diet. Pt well versed on low sodium. Pt to BR then ambulated in hall with RW. After 110 ft, c/o dizziness. Pt quiet, sts he was "fine", declined sitting, but slightly dragging feet with more slumped posture. Made it back to EOB, seemed exhausted. However BP stable, VSS. HR lower today, still with bursts of afib versus SVT.  [place], ACSM 06/12/2020 1:58 PM

## 2020-06-12 NOTE — Progress Notes (Signed)
PROGRESS NOTE    Charles Gray  KZL:[id number] DOB: 06-Jul-1955 DOA: 06/09/2020 PCP: Enid Skeens., MD    Brief Narrative:  Charles Gray is a 65 year old male with past medical history significant for chronic diastolic congestive heart failure, right-sided heart failure, paroxysmal atrial fibrillation on Eliquis, history of thromboembolic disease, hyperlipidemia, essential hypertension, obesity, obstructive sleep apnea, GERD and cirrhosis who presented [facility] ED via EMS for progressive shortness of breath. Onset 3 days ago with worsening associated with bilateral lower extremity edema. Now reports multiple blisters on lower extremities following worsening edema. Patient also reports paroxysmal nocturnal dyspnea. Denies chest pain, no cough, no fevers or sick contacts, no recent travel or contact confirmed with Covid-19 infected individual.  In the ED, temperature 99 F, HR 80, RR 16, BP 122/73, SPO2 98% on room air. Sodium 134, potassium 4.9, chloride 95, CO2 25, glucose 106, BUN 15, creatinine 0.92, AST 55, ALT 25, total bilirubin 1.1. WBC 7.2, hemoglobin 12.8, platelets 46.Covid-19/SARS-CoV-2 negative. Chest x-ray with no acute cardiopulmonary disease process. EKG with normal sinus rhythm, rate 72, normal QTC, no concerning ST elevation/depression or T wave inversion. Patient was given 120 mg IV Lasix in the ED. Hospitalist service consulted for further evaluation and management of acute on chronic CHF exacerbation.   Assessment & Plan:   Principal Problem:   Acute on chronic right heart failure (HCC) Active Problems:   Mixed hyperlipidemia   Cirrhosis of liver without ascites (HCC)   Chronic pain syndrome   History of pulmonary embolism   OSA (obstructive sleep apnea)   Acute on chronic diastolic CHF (congestive heart failure) (HCC)   AF (paroxysmal atrial fibrillation) (HCC)   Essential hypertension   GERD without esophagitis   Acute on chronic diastolic congestive  heart failure  Right-sided heart failure Patient presenting to the ED with progressive shortness of breath associated with orthopnea and lower extremity edema over the past 3 days. EKG with NSR, HR 72 with no concerning ST elevation/depression or T wave inversions. Chest x-ray with no acute cardiopulmonary disease process. BNP 41.2. High-sensitivity troponin 7, within normal limits. TTE 03/2020 with LVEF 60 to 65%, RV not well visualized, grade 2 diastolic dysfunction. Follows with cardiology outpatient, has been offered CardioMEMS but has refused. Home regimen includes Bumex 4 mg qam and 71m qpm, metoprolol succinate 25 mg p.o. daily, and metolazone 2.5 mg 2 times weekly. --Cardiology following, appreciate assistance --net negative 1.7L past 24h, net negative 1.5L since admission --Wt 165.6>163.5>164.5kg --IV Furosemide transitioned to torsemide 677mPO BID today --Spironolactone 50 mg PO daily --Strict I's and O's and daily weights --Follow BMP daily  Thrombocytopenia: Platelets 46 on presentation, no clinical evidence of blood loss/bleeding. Patient with underlying history of cirrhosis; but significant drop from previous platelet level of 156. --repeat Plt [id number]? Lab error --restarted home Eliquis --repeat CBC in am  Paroxysmal atrial fibrillation Essential hypertension --Metoprolol succinate increased to 25 mg p.o. BID --Eliquis 5 mg p.o. twice daily --Continue to monitor on telemetry  OSA --Nocturnal CPAP, but likely noncompliant  Chronic pain syndrome --Continue home Suboxone 8-35m26mL BID --Gabapentin 800 mg p.o. 3 times daily  History of PE --Holding home Eliquis secondary to thrombocytopenia as above  GERD without esophagitis: Continue PPI  Anxiety/depression: --Sertraline 25 mg p.o. daily  Hyperlipidemia: Pravastatin 20 mg p.o. daily  Obese Body mass index is 37.92 kg/m. Discussed with patient needs for aggressive lifestyle changes/weight loss as this  complicates all facets of care.  Outpatient follow-up with  PCP.    Weakness/debility/deconditioning: --PT recommends home health --OT: no needs  DVT prophylaxis: Eliquis   Code Status: Full Code Family Communication: No family present at bedside this morning  Disposition Plan:  Level of care: Telemetry Cardiac Status is: Inpatient  Remains inpatient appropriate because:Ongoing diagnostic testing needed not appropriate for outpatient work up, Unsafe d/c plan, IV treatments appropriate due to intensity of illness or inability to take PO and Inpatient level of care appropriate due to severity of illness   Dispo: The patient is from: Home              Anticipated d/c is to: Home              Patient currently is medically stable to d/c.   Difficult to place patient No   Consultants:   Cardiology, Dr. [name]  Procedures:   None  Antimicrobials:   None   Subjective: Patient seen and examined at bedside, resting comfortably sitting in bedside chair.  Patient reports some abdominal discomfort, no bowel movement since Sunday.  Transition to oral diuretics today by cardiology.  Receiving MiraLAX and prune juice.  No other complaints or concerns at this time. Denies headache, no fever/chills/night sweats, no nausea/vomiting/diarrhea, no chest pain, no palpitations, no weakness, no fatigue, no paresthesias. No acute events overnight per nursing staff.  Objective: Vitals:   06/12/20 0456 06/12/20 0733 06/12/20 0925 06/12/20 1139  BP: 127/84 128/79 127/77 115/70  Pulse: 69 62 67 68  Resp: 20 20 18 20   Temp: (!) 97.5 F (36.4 C) 97.7 F (36.5 C)  98.1 F (36.7 C)  TempSrc: Oral Oral  Oral  SpO2: 100% 100% 98% 98%  Weight: (!) 164.5 kg     Height:        Intake/Output Summary (Last 24 hours) at 06/12/2020 1239 Last data filed at 06/12/2020 1008 Gross per 24 hour  Intake 1359.18 ml  Output 2450 ml  Net -1090.82 ml   Filed Weights   06/10/20 0221 06/11/20 0030 06/12/20 0456   Weight: (!) 164.6 kg (!) 163.5 kg (!) 164.5 kg    Examination:  General exam: Appears calm and comfortable, obese, ill in appearance Respiratory system: Clear to auscultation. Respiratory effort normal. Oxygenating well on room air Cardiovascular system: S1 & S2 heard, RRR. No JVD, murmurs, rubs, gallops or clicks. No pedal edema. Gastrointestinal system: Abdomen is nondistended, soft and nontender. No organomegaly or masses felt. Normal bowel sounds heard. Central nervous system: Alert and oriented. No focal neurological deficits. Extremities: Symmetric 5 x 5 power. Skin: No rashes, lesions or ulcers Psychiatry: Judgement and insight appear poor. Mood & affect appropriate.     Data Reviewed: I have personally reviewed following labs and imaging studies  CBC: Recent Labs  Lab 06/09/20 2224 06/10/20 1243 06/11/20 0342 06/12/20 0400  WBC 7.2 8.8 8.5 6.8  HGB 12.8* 12.8* 11.9* 13.0  HCT 44.6 41.2 37.5* 41.4  MCV 86.1 80.6 81.0 81.2  PLT 46* 158 148* 735*   Basic Metabolic Panel: Recent Labs  Lab 06/09/20 2224 06/10/20 0259 06/11/20 0342 06/12/20 0400  NA 134* 136 137 135  K 4.9 3.7 3.5 3.7  CL 95* 95* 94* 95*  CO2 25 30 31 29   GLUCOSE 106* 108* 116* 98  BUN 15 13 17 19   CREATININE 0.92 0.95 0.96 0.91  CALCIUM 9.0 9.4 9.3 9.3  MG  --  2.3 2.4 2.4   GFR: Estimated Creatinine Clearance: 146.4 mL/min (by C-G formula based on SCr of  0.91 mg/dL). Liver Function Tests: Recent Labs  Lab 06/09/20 2224  AST 55*  ALT 25  ALKPHOS 80  BILITOT 1.1  PROT 7.2  ALBUMIN 3.6   No results for input(s): LIPASE, AMYLASE in the last 168 hours. No results for input(s): AMMONIA in the last 168 hours. Coagulation Profile: No results for input(s): INR, PROTIME in the last 168 hours. Cardiac Enzymes: No results for input(s): CKTOTAL, CKMB, CKMBINDEX, TROPONINI in the last 168 hours. BNP (last 3 results) No results for input(s): PROBNP in the last 8760 hours. HbA1C: No results  for input(s): HGBA1C in the last 72 hours. CBG: Recent Labs  Lab 06/12/20 1141  GLUCAP 112*   Lipid Profile: No results for input(s): CHOL, HDL, LDLCALC, TRIG, CHOLHDL, LDLDIRECT in the last 72 hours. Thyroid Function Tests: No results for input(s): TSH, T4TOTAL, FREET4, T3FREE, THYROIDAB in the last 72 hours. Anemia Panel: No results for input(s): VITAMINB12, FOLATE, FERRITIN, TIBC, IRON, RETICCTPCT in the last 72 hours. Sepsis Labs: No results for input(s): PROCALCITON, LATICACIDVEN in the last 168 hours.  Recent Results (from the past 240 hour(s))  SARS CORONAVIRUS 2 (TAT 6-24 HRS) Nasopharyngeal Nasopharyngeal Swab     Status: None   Collection Time: 06/10/20  1:12 AM   Specimen: Nasopharyngeal Swab  Result Value Ref Range Status   SARS Coronavirus 2 NEGATIVE NEGATIVE Final    Comment: (NOTE) SARS-CoV-2 target nucleic acids are NOT DETECTED.  The SARS-CoV-2 RNA is generally detectable in upper and lower respiratory specimens during the acute phase of infection. Negative results do not preclude SARS-CoV-2 infection, do not rule out co-infections with other pathogens, and should not be used as the sole basis for treatment or other patient management decisions. Negative results must be combined with clinical observations, patient history, and epidemiological information. The expected result is Negative.  Fact Sheet for Patients: SugarRoll.be  Fact Sheet for Healthcare Providers: https://www.woods-mathews.com/  This test is not yet approved or cleared by the [place] FDA and  has been authorized for detection and/or diagnosis of SARS-CoV-2 by FDA under an Emergency Use Authorization (EUA). This EUA will remain  in effect (meaning this test can be used) for the duration of the COVID-19 declaration under Se ction 564(b)(1) of the Act, 21 U.S.C. section 360bbb-3(b)(1), unless the authorization is terminated or revoked  sooner.  Performed at [facility], [place] [address], [place], [place] [address]          Radiology Studies: No results found.      Scheduled Meds: . apixaban  5 mg Oral BID  . buprenorphine-naloxone  1 tablet Sublingual BID  . gabapentin  800 mg Oral TID  . metoprolol succinate  25 mg Oral BID  . pantoprazole  40 mg Oral BID  . pravastatin  20 mg Oral QHS  . sodium chloride flush  3 mL Intravenous Q12H  . spironolactone  50 mg Oral Daily  . torsemide  60 mg Oral BID   Continuous Infusions: . sodium chloride       LOS: 2 days    Time spent: 38 minutes spent on chart review, discussion with nursing staff, consultants, updating family and interview/physical exam; more than 50% of that time was spent in counseling and/or coordination of care.    Dyanara Cozza J [place] DO [facility] Available via Epic secure chat 7am-7pm After these hours, please refer to coverage provider listed on [email or web address] 06/12/2020, 12:39 PM

## 2020-06-12 NOTE — Progress Notes (Addendum)
Advanced Heart Failure Rounding Note  PCP-Cardiologist: Kardie Tobb, DO   Subjective:    Breathing continues to improve, still with lower extremity edema.  He denies chest pain, orthopnea.  Weight stable, pm output not recorded.  Had some abdominal pain after eating last night he is constipated but doesn't want a stronger laxative.  He is nervous about going home on torsemide thinks he had a problem with this medicine in the past but cannot remember what it was.  I told him it looked like it was stopped in the past when he was felt to be dry on examination but that he has no allergy to it.    Was able to see him after working with PT, he is severely deconditioned and fatigued afterwards but did better today according to PT he maintained SPO2 96% and better HR.     Objective:   Weight Range: (!) 164.5 kg Body mass index is 37.92 kg/m.   Vital Signs:   Temp:  [97.3 F (36.3 C)-98.5 F (36.9 C)] 97.7 F (36.5 C) (03/02 0733) Pulse Rate:  [53-69] 62 (03/02 0733) Resp:  [18-20] 20 (03/02 0733) BP: (98-128)/(68-84) 128/79 (03/02 0733) SpO2:  [96 %-100 %] 100 % (03/02 0733) Weight:  [164.5 kg] 164.5 kg (03/02 0456) Last BM Date: 06/06/20  Weight change: Filed Weights   06/10/20 0221 06/11/20 0030 06/12/20 0456  Weight: (!) 164.6 kg (!) 163.5 kg (!) 164.5 kg    Intake/Output:   Intake/Output Summary (Last 24 hours) at 06/12/2020 0740 Last data filed at 06/12/2020 0000 Gross per 24 hour  Intake 1122.18 ml  Output 2550 ml  Net -1427.82 ml      Physical Exam    Cardiac: JVD difficult to assess, distant heart sounds, normal rate and rhythm, clear s1 and s2, no murmurs, rubs or gallops,  1+ LE edema Pulmonary: CTAB, not in distress Abdominal: obese, non distended abdomen, soft and nontender Psych: Alert, conversant, in good spirits    Telemetry   SR/ Sinus arrhythmia 70s-80s   EKG    No new EKG to review   Labs    CBC Recent Labs    06/11/20 0342  06/12/20 0400  WBC 8.5 6.8  HGB 11.9* 13.0  HCT 37.5* 41.4  MCV 81.0 81.2  PLT 148* 062*   Basic Metabolic Panel Recent Labs    06/11/20 0342 06/12/20 0400  NA 137 135  K 3.5 3.7  CL 94* 95*  CO2 31 29  GLUCOSE 116* 98  BUN 17 19  CREATININE 0.96 0.91  CALCIUM 9.3 9.3  MG 2.4 2.4   Liver Function Tests Recent Labs    06/09/20 2224  AST 55*  ALT 25  ALKPHOS 80  BILITOT 1.1  PROT 7.2  ALBUMIN 3.6   No results for input(s): LIPASE, AMYLASE in the last 72 hours. Cardiac Enzymes No results for input(s): CKTOTAL, CKMB, CKMBINDEX, TROPONINI in the last 72 hours.  BNP: BNP (last 3 results) Recent Labs    04/16/20 1553 05/07/20 0140 06/10/20 0259  BNP 38.9 351.2* 41.2    ProBNP (last 3 results) No results for input(s): PROBNP in the last 8760 hours.   D-Dimer No results for input(s): DDIMER in the last 72 hours. Hemoglobin A1C No results for input(s): HGBA1C in the last 72 hours. Fasting Lipid Panel No results for input(s): CHOL, HDL, LDLCALC, TRIG, CHOLHDL, LDLDIRECT in the last 72 hours. Thyroid Function Tests No results for input(s): TSH, T4TOTAL, T3FREE, THYROIDAB in the  last 72 hours.  Invalid input(s): FREET3  Other results:   Imaging    No results found.   Medications:     Scheduled Medications: . apixaban  5 mg Oral BID  . buprenorphine-naloxone  1 tablet Sublingual BID  . gabapentin  800 mg Oral TID  . metoprolol succinate  25 mg Oral BID  . pantoprazole  40 mg Oral BID  . potassium chloride  40 mEq Oral Once  . pravastatin  20 mg Oral QHS  . sodium chloride flush  3 mL Intravenous Q12H  . spironolactone  50 mg Oral Daily    Infusions: . sodium chloride      PRN Medications: sodium chloride, acetaminophen, albuterol, ondansetron (ZOFRAN) IV, polyethylene glycol, sodium chloride, sodium chloride flush, zolpidem   Assessment/Plan   1. Acute on chronic diastolic CHF: Echo in 01/02 with EF 72-53%, grade 2 diastolic  dysfunction, RV poorly visualized. LHC/RHC in 1/22 showed no significant CAD, mildly elevated right heart filling pressures. Patient has had multiple admissions with CHF. I suspect that obesity and deconditioning as well as anxiety play a large role in his symptomatology.  He has had a couple doses of IV Lasix w/ good UOP, remains mildly fluid overloaded on exam. SCr/K stable  -transition to torsemide 60 mg bid today.  He will continue metolazone 2.5 mg once weekly (has only been taking once a week).  - Ideally, would have Cardiomems placed to help guide treatment. However, he is still not ready to have it placed. -Refuses Jardiance.   - Continue spironolactone 50 mg daily (on eplerenone 100 mg daily at home, restart at discharge).  -Continue Toprol XL 25 mg bid (dose increased) 2. H/o PE/DVT: Suspect due to sedentary lifestyle. Occurred in 2/21. V/Q scan in 6/21 did not show evidence for chronic PE.  - Continue apixaban.No bleeding issues. 3. OSA: Cannot tolerate CPAP.  4. Anxiety/panic: Very anxious in general.  -  sertraline 25 mg daily ordered but pt refused 5. Atrial fibrillation: Paroxysmal.He is in NSR.  - Continue apixaban. 6. Deconditioning: I think that a lot of his dyspnea and palpitations can be explained by deconditioning. -PT recommending home health PT, cardiac rehab following 7.Thrombocytopenia: Plts down to 46K on admission but likely spurious result as repeat was at his baseline  - continue to follow, can continue Eliquis.   Length of Stay: 2  Charles Roan, MD  06/12/2020, 7:40 AM  Advanced Heart Failure Team Pager [phone] (M-F; [place])  Please contact Charles Gray for night-coverage after hours (4p -7a ) and weekends on [email or web address]  Patient seen with Dr. Shan Levans, agree with the above note.   He feels like his breathing is improving and was able to walk in hall without difficulty.  Main complaint today is constipation and abdominal discomfort.  No  BM since Sunday.   General: NAD Neck: No JVD, no thyromegaly or thyroid nodule.  Lungs: Clear to auscultation bilaterally with normal respiratory effort. CV: Nondisplaced PMI.  Heart regular S1/S2, no S3/S4, no murmur.  Trace ankle edema.  Abdomen: Soft, nontender, no hepatosplenomegaly, no distention.  Skin: Intact without lesions or rashes.  Neurologic: Alert and oriented x 3.  Psych: Normal affect. Extremities: No clubbing or cyanosis.  HEENT: Normal.   Volume looks better, transition to torsemide 60 mg po bid.  Continue spironolactone.  He says that he cannot tolerate Jardiance.  He does not want a Cardiomems device though I think this would be very helpful.  Anxiety remains  a major issue.   He is constipated, will give Miralax.   From my standpoint, he could go home today.  Will arrange followup in CHF clinic.  Meds for home: eplerenone 100 mg daily, KCl 40 qam/20 qpm, Toprol XL 25 mg bid, Eliquis 5 mg bid, pravastatin 20 mg daily, torsemide 60 mg bid, metolazone 2.5 mg once a week.   Loralie Champagne 06/12/2020 11:02 AM

## 2020-06-12 NOTE — Progress Notes (Signed)
Physical Therapy Treatment Patient Details Name: Charles Gray MRN: [id number] DOB: 1955/10/20 Today's Date: 06/12/2020    History of Present Illness Charles Gray is a 65 year old male with obesity, mild COPD, [name], DVT/PE (05/2019, on Eliquis), paroxysmal AF, hypertension, untreated OSA, insomnia, coronary artery disease by CCTA, chronic pain, upper GI bleed (Fall 2020), and HFpEF. Presents with chest pain, SOB, and increased leg swelling. Admitted with acute on chronic diastolic CHF.    PT Comments    Pt progressing steadily towards his physical therapy goals. Session focused on warm up exercises and ambulation. Pt ambulating x 250 feet with a walker at a supervision level. HR much more controlled today, 85-122 bpm; SpO2 96% on RA. D/c plan remains appropriate.     Follow Up Recommendations  Home health PT;Supervision - Intermittent     Equipment Recommendations  None recommended by PT    Recommendations for Other Services       Precautions / Restrictions Precautions Precautions: Fall;Other (comment) Precaution Comments: watch HR Restrictions Weight Bearing Restrictions: No    Mobility  Bed Mobility               General bed mobility comments: OOB in chair    Transfers Overall transfer level: Modified independent Equipment used: Rolling walker (2 wheeled)                Ambulation/Gait Ambulation/Gait assistance: Supervision Gait Distance (Feet): 250 Feet Assistive device: Rolling walker (2 wheeled) Gait Pattern/deviations: Step-through pattern;Decreased stride length Gait velocity: decreased   General Gait Details: Slow and steady pace, required one standing rest break, cues for activity pacing   Stairs             Wheelchair Mobility    Modified Rankin (Stroke Patients Only)       Balance Overall balance assessment: Mild deficits observed, not formally tested                                          Cognition  Arousal/Alertness: Awake/alert Behavior During Therapy: WFL for tasks assessed/performed Overall Cognitive Status: Within Functional Limits for tasks assessed                                        Exercises General Exercises - Lower Extremity Long Arc Quad: Both;10 reps;Seated Hip Flexion/Marching: Both;10 reps;Seated Heel Raises: Both;15 reps;Seated    General Comments        Pertinent Vitals/Pain Pain Assessment: Faces Faces Pain Scale: Hurts little more Pain Location: upper abdomen Pain Descriptors / Indicators: Discomfort Pain Intervention(s): Monitored during session    Home Living                      Prior Function            PT Goals (current goals can now be found in the care plan section) Acute Rehab PT Goals Patient Stated Goal: improved breathing PT Goal Formulation: With patient Time For Goal Achievement: 06/25/20 Potential to Achieve Goals: Good Progress towards PT goals: Progressing toward goals    Frequency    Min 3X/week      PT Plan Current plan remains appropriate    Co-evaluation              AM-PAC PT "6 Clicks"  Mobility   Outcome Measure  Help needed turning from your back to your side while in a flat bed without using bedrails?: None Help needed moving from lying on your back to sitting on the side of a flat bed without using bedrails?: None Help needed moving to and from a bed to a chair (including a wheelchair)?: None Help needed standing up from a chair using your arms (e.g., wheelchair or bedside chair)?: None Help needed to walk in hospital room?: None Help needed climbing 3-5 steps with a railing? : A Little 6 Click Score: 23    End of Session   Activity Tolerance: Patient tolerated treatment well Patient left: with call bell/phone within reach;in chair Nurse Communication: Mobility status PT Visit Diagnosis: Difficulty in walking, not elsewhere classified (R26.2)     Time: 6773-7366 PT  Time Calculation (min) (ACUTE ONLY): 22 min  Charges:  $Therapeutic Activity: 8-22 mins                     Wyona Almas, PT, DPT Acute Rehabilitation Services Pager [phone] Office [phone]    Deno Etienne 06/12/2020, 8:54 AM

## 2020-06-12 NOTE — TOC Transition Note (Addendum)
Transition of Care [facility]) - CM/SW Discharge Note   Patient Details  Name: Charles Gray MRN: [id number] Date of Birth: 1956/01/03  [facility] CM/SW Contact:  Zenon Mayo, RN Phone Number: 06/12/2020, 6:39 PM   Clinical Narrative:    NCM spoke with patient, he lives alone, he has a Therapist, sports with paramedicine, he has a scale, bp cuff, a walker, pulse ox and a shower chair.  He states he also has been ok'd to have PCS services for 28 hrs, but has not found an agency yet.  He does not have a preference for agency for HHPT. NCM made referral to [facility] with Alvis Lemmings ,awatiing to hear back.  NCM informed by Tommi Rumps with Alvis Lemmings that he can take referral for HHPT. Soc will begin 24 to 48 hrs post dc. Patient uses [facility] transport for transportation.    Final next level of care: [place] Barriers to Discharge: Continued Medical Work up   Patient Goals and CMS Choice Patient states their goals for this hospitalization and ongoing recovery are:: get better CMS Medicare.gov Compare Post Acute Care list provided to:: Patient Choice offered to / list presented to : Patient  Discharge Placement                       Discharge Plan and Services In-house Referral: NA Discharge Planning Services: CM Consult Post Acute Care Choice: Home Health            DME Agency: NA       HH Arranged: PT, RN  [place] Agency: [place] Date [place]: 06/12/20 Time HH Agency Contacted: 1700 Representative spoke with at [place]: [place] Determinants of Health ([place]) Interventions Financial Strain Interventions: Intervention Not Indicated Housing Interventions: Intervention Not Indicated Depression Interventions/Treatment : Patient refuses Treatment   Readmission Risk Interventions Readmission Risk Prevention Plan 06/12/2020 03/27/2020 02/02/2020  Transportation Screening Complete Complete Complete  HRI or [place] Work  Consult for [place] Planning/Counseling - - -  Medication Review Press photographer) Complete Complete Complete  PCP or Specialist appointment within 3-5 days of discharge - Complete -  [place] or [place] Complete Complete Complete  SW Recovery Care/Counseling Consult Complete Complete Complete  Palliative Care Screening Not Applicable Not Applicable Not [place] Not Applicable Not Applicable Not Applicable

## 2020-06-12 NOTE — Telephone Encounter (Signed)
Patient currently admitted

## 2020-06-12 NOTE — Evaluation (Signed)
Occupational Therapy Evaluation Patient Details Name: Charles Gray MRN: [id number] DOB: Apr 06, 1956 Today's Date: 06/12/2020    History of Present Illness Charles Gray is a 65 year old male with obesity, mild COPD, [name], DVT/PE (05/2019, on Eliquis), paroxysmal AF, hypertension, untreated OSA, insomnia, coronary artery disease by CCTA, chronic pain, upper GI bleed (Fall 2020), and HFpEF. Presents with chest pain, SOB, and increased leg swelling. Admitted with acute on chronic diastolic CHF.   Clinical Impression   Patient lives alone in apartment with 16 STE. At baseline pt reports limited ambulation with walker, is I with self care, has groceries delivered to his apartment. Currently patient requiring no physical assistance with self care tasks assessed LB dressing, g/h, functional ambulation in room and transfer to recliner. Post activity pt HR 79. Pt states he feels he can care for himself at home, is close to his baseline. No further acute OT needs at this time, will sign off. Please re-consult if new needs arise.    Follow Up Recommendations  No OT follow up    Equipment Recommendations  None recommended by OT       Precautions / Restrictions Precautions Precautions: Fall;Other (comment) Precaution Comments: watch HR Restrictions Weight Bearing Restrictions: No      Mobility Bed Mobility Overal bed mobility: Modified Independent             General bed mobility comments: OOB in chair    Transfers Overall transfer level: Modified independent Equipment used: Rolling walker (2 wheeled)                  Balance Overall balance assessment: Mild deficits observed, not formally tested                                         ADL either performed or assessed with clinical judgement   ADL Overall ADL's : Modified independent                                       General ADL Comments: patient demonstrates ability to perform LB  dressing, seated sink side g/h and functional ambulation + transfer with walker without physical assistance. Once seated in recliner pt HR reading 79                  Pertinent Vitals/Pain Pain Assessment: Faces Faces Pain Scale: No hurt Pain Location: upper abdomen Pain Descriptors / Indicators: Discomfort Pain Intervention(s): Monitored during session     Hand Dominance Right   Extremity/Trunk Assessment Upper Extremity Assessment Upper Extremity Assessment: Overall WFL for tasks assessed   Lower Extremity Assessment Lower Extremity Assessment: Defer to PT evaluation       Communication Communication Communication: No difficulties   Cognition Arousal/Alertness: Awake/alert Behavior During Therapy: WFL for tasks assessed/performed Overall Cognitive Status: Within Functional Limits for tasks assessed                                        Exercises Exercises: General Lower Extremity General Exercises - Lower Extremity Long Arc Quad: Both;10 reps;Seated Hip Flexion/Marching: Both;10 reps;Seated Heel Raises: Both;15 reps;Seated        Home Living Family/patient expects to be discharged to:: Private residence Living Arrangements:  Alone Available Help at Discharge: Family;Available PRN/intermittently Type of Home: Apartment Home Access: Stairs to enter Entrance Stairs-Number of Steps: 16 steps Entrance Stairs-Rails: Right;Left;Can reach both Home Layout: One level     Bathroom Shower/Tub: Teacher, early years/pre: Standard Bathroom Accessibility: Yes How Accessible: Accessible via walker Home Equipment: Tub bench;Walker - 2 wheels;Hand held shower head          Prior Functioning/Environment Level of Independence: Needs assistance  Gait / Transfers Assistance Needed: using walker ADL's / Homemaking Assistance Needed: Orders groceries            OT Problem List: Decreased activity tolerance         OT Goals(Current  goals can be found in the care plan section) Acute Rehab OT Goals Patient Stated Goal: have breakfast OT Goal Formulation: All assessment and education complete, DC therapy   AM-PAC OT "6 Clicks" Daily Activity     Outcome Measure Help from another person eating meals?: None Help from another person taking care of personal grooming?: None Help from another person toileting, which includes using toliet, bedpan, or urinal?: None Help from another person bathing (including washing, rinsing, drying)?: None Help from another person to put on and taking off regular upper body clothing?: None Help from another person to put on and taking off regular lower body clothing?: None 6 Click Score: 24   End of Session Equipment Utilized During Treatment: Rolling walker Nurse Communication: Mobility status  Activity Tolerance: Patient tolerated treatment well Patient left: in chair;with call bell/phone within reach;with nursing/sitter in room  OT Visit Diagnosis: Unsteadiness on feet (R26.81)                Time: 0712-0728 OT Time Calculation (min): 16 min Charges:  OT General Charges $OT Visit: 1 Visit OT Evaluation $OT Eval Low Complexity: 1 Low  Charles Gray OT OT pager: [place] 06/12/2020, 9:53 AM

## 2020-06-13 ENCOUNTER — Encounter (HOSPITAL_COMMUNITY): Payer: Medicare HMO

## 2020-06-13 LAB — BASIC METABOLIC PANEL
Anion gap: 11 (ref 5–15)
BUN: 14 mg/dL (ref 8–23)
CO2: 32 mmol/L (ref 22–32)
Calcium: 8.8 mg/dL — ABNORMAL LOW (ref 8.9–10.3)
Chloride: 93 mmol/L — ABNORMAL LOW (ref 98–111)
Creatinine, Ser: 0.99 mg/dL (ref 0.61–1.24)
GFR, Estimated: >60 mL/min (ref >60)
Glucose, Bld: 123 mg/dL — ABNORMAL HIGH (ref 70–99)
Potassium: 3.1 mmol/L — ABNORMAL LOW (ref 3.5–5.1)
Sodium: 136 mmol/L (ref 135–145)

## 2020-06-13 LAB — CBC
HCT: 37.6 % — ABNORMAL LOW (ref 39.0–52.0)
Hemoglobin: 11.4 g/dL — ABNORMAL LOW (ref 13.0–17.0)
MCH: 24.8 pg — ABNORMAL LOW (ref 26.0–34.0)
MCHC: 30.3 g/dL (ref 30.0–36.0)
MCV: 81.9 fL (ref 80.0–100.0)
Platelets: 142 10*3/uL — ABNORMAL LOW (ref 150–400)
RBC: 4.59 MIL/uL (ref 4.22–5.81)
RDW: 15.8 % — ABNORMAL HIGH (ref 11.5–15.5)
WBC: 7.8 10*3/uL (ref 4.0–10.5)
nRBC: 0 % (ref 0.0–0.2)

## 2020-06-13 MED ORDER — POTASSIUM CHLORIDE CRYS ER 20 MEQ PO TBCR: 20.0000 meq | EXTENDED_RELEASE_TABLET | Freq: Every evening | ORAL | 0 refills | Status: DC

## 2020-06-13 MED ORDER — METOPROLOL SUCCINATE ER 25 MG PO TB24: 25.0000 mg | ORAL_TABLET | Freq: Two times a day (BID) | ORAL | 0 refills | Status: DC

## 2020-06-13 MED ORDER — SENNA 8.6 MG PO TABS: 2.0000 | ORAL_TABLET | Freq: Every day | ORAL | 0 refills | Status: DC

## 2020-06-13 MED ORDER — METOLAZONE 2.5 MG PO TABS: 2.5000 mg | ORAL_TABLET | ORAL | 0 refills | Status: DC

## 2020-06-13 MED ORDER — POTASSIUM CHLORIDE CRYS ER 20 MEQ PO TBCR
40.0000 meq | EXTENDED_RELEASE_TABLET | Freq: Every morning | ORAL | 0 refills | Status: DC
Start: 2020-06-13 — End: 2020-07-02

## 2020-06-13 MED ORDER — POLYETHYLENE GLYCOL 3350 17 G PO PACK
17.0000 g | PACK | Freq: Two times a day (BID) | ORAL | 0 refills | Status: AC | PRN
Start: 2020-06-13 — End: 2020-07-13

## 2020-06-13 MED ORDER — POTASSIUM CHLORIDE CRYS ER 20 MEQ PO TBCR
40.0000 meq | EXTENDED_RELEASE_TABLET | ORAL | Status: AC
  Administered 2020-06-13 (×2): 40 meq via ORAL
  Filled 2020-06-13 (×2): qty 2

## 2020-06-13 MED ORDER — TORSEMIDE 60 MG PO TABS: 60.0000 mg | ORAL_TABLET | Freq: Two times a day (BID) | ORAL | 0 refills | Status: DC

## 2020-06-13 NOTE — Progress Notes (Signed)
D/C instructions given and reviewed. Tele and IV removed, tolerated well. Son will arrive around 4 to transport home.

## 2020-06-13 NOTE — Discharge Summary (Signed)
Physician Discharge Summary  Lannie Yusuf YSA:[id number] DOB: 1955-09-05 DOA: 06/09/2020  PCP: Enid Skeens., MD  Admit date: 06/09/2020 Discharge date: 06/13/2020  Admitted From: Home Disposition: Home  Recommendations for Outpatient Follow-up:  1. Follow up with PCP in 1-2 weeks 2. Discontinued Bumex in favor of torsemide 60 mg p.o. twice daily 3. Increased metoprolol succinate to 25 mg p.o. twice daily 4. Follow-up with heart failure team on 07/02/2020 at 09 30 5. Please obtain BMP at next PCP/specialist visit  Home Health: PT/RN Equipment/Devices: None  Discharge Condition: Stable CODE STATUS: Full code Diet recommendation: Heart healthy diet  History of present illness:  Charles Gray is a 65 year old male with past medical history significant for chronic diastolic congestive heart failure, right-sided heart failure, paroxysmal atrial fibrillation on Eliquis, history of thromboembolic disease, hyperlipidemia, essential hypertension, obesity, obstructive sleep apnea, GERD and cirrhosis who presented to [facility] ED via EMS for progressive shortness of breath. Onset 3 days ago with worsening associated with bilateral lower extremity edema. Now reports multiple blisters on lower extremities following worsening edema. Patient also reports paroxysmal nocturnal dyspnea. Denies chest pain, no cough, no fevers or sick contacts, no recent travel or contact confirmed with Covid-19 infected individual.  In the ED, temperature 99 F, HR 80, RR 16, BP 122/73, SPO2 98% on room air. Sodium 134, potassium 4.9, chloride 95, CO2 25, glucose 106, BUN 15, creatinine 0.92, AST 55, ALT 25, total bilirubin 1.1. WBC 7.2, hemoglobin 12.8, platelets 46.Covid-19/SARS-CoV-2 negative. Chest x-ray with no acute cardiopulmonary disease process. EKG with normal sinus rhythm, rate 72, normal QTC, no concerning ST elevation/depression or T wave inversion. Patient was given 120 mg IV Lasix in the ED. Hospitalist  service consulted for further evaluation and management of acute on chronic CHF exacerbation.  Hospital course:  Acute on chronic diastolic congestive heart failure  Right-sided heart failure Patient presenting to the ED with progressive shortness of breath associated with orthopnea and lower extremity edema over the past 3 days. EKG with NSR, HR 72 with no concerning ST elevation/depression or T wave inversions. Chest x-ray with no acute cardiopulmonary disease process. BNP 41.2. High-sensitivity troponin 7, within normal limits. TTE 03/2020 with LVEF 60 to 65%, RV not well visualized, grade 2 diastolic dysfunction. Follows with cardiology outpatient, has been offered CardioMEMS but has refused. Home regimen includes Bumex 4 mg qam and 35m qpm, metoprolol succinate 25 mg p.o. daily, and metolazone 2.5 mg 2 times weekly.  Cardiology was consulted and followed during hospital course.  Patient was started on aggressive IV furosemide with improvement of symptoms.  Cardiology discontinued his home Bumex in favor of torsemide 60 mg p.o. twice daily.  Metoprolol succinate was increased to 25 mg p.o. twice daily.  Patient will continue metolazone 2.5 mg once weekly and eplerenone 100 mg p.o. daily.  Patient has follow-up scheduled with heart failure clinic on 07/02/2020.  Thrombocytopenia: Secondary to spurious lab value Platelets 46 on presentation, no clinical evidence of blood loss/bleeding. Patient with underlying history of cirrhosis; but significant drop from previous platelet level of 156.  Repeat platelet level 158 which was consistent with his normal levels.  Continue home Eliquis.  Paroxysmal atrial fibrillation Essential hypertension --Metoprolol succinate increased to 25 mg p.o. BID --Eliquis 5 mg p.o. twice daily --Continue to monitor on telemetry  OSA: Nocturnal CPAP, but  noncompliant  Chronic pain syndrome Continue home Suboxone 8-235mSL BID, Gabapentin 800 mg p.o. 3 times  daily  History of PE Continue Eliquis  GERD without esophagitis: Continue PPI  Anxiety/depression: Sertraline 25 mg p.o. daily  Hyperlipidemia: Pravastatin 20 mg p.o. daily  Obese Body mass index is 37.92 kg/m. Discussed with patient needs for aggressive lifestyle changes/weight loss as this complicates all facets of care.  Outpatient follow-up with PCP.    Weakness/debility/deconditioning: Evaluated by PT and OT with PT recommends home health  Discharge Diagnoses:  Principal Problem:   Acute on chronic right heart failure (HCC) Active Problems:   Mixed hyperlipidemia   Cirrhosis of liver without ascites (HCC)   Chronic pain syndrome   History of pulmonary embolism   OSA (obstructive sleep apnea)   Acute on chronic diastolic CHF (congestive heart failure) (HCC)   AF (paroxysmal atrial fibrillation) (HCC)   Essential hypertension   GERD without esophagitis    Discharge Instructions  Discharge Instructions    Call MD for:  difficulty breathing, headache or visual disturbances   Complete by: As directed    Call MD for:  extreme fatigue   Complete by: As directed    Call MD for:  persistant dizziness or light-headedness   Complete by: As directed    Call MD for:  persistant nausea and vomiting   Complete by: As directed    Call MD for:  severe uncontrolled pain   Complete by: As directed    Call MD for:  temperature >100.4   Complete by: As directed    Diet - low sodium heart healthy   Complete by: As directed    Increase activity slowly   Complete by: As directed      Allergies as of 06/13/2020      Reactions   Penicillins    Broke out in convulsions as a child  Did it involve swelling of the face/tongue/throat, SOB, or low BP? N/A Did it involve sudden or severe rash/hives, skin peeling, or any reaction on the inside of your mouth or nose? N/A Did you need to seek medical attention at a hospital or doctor's office?N/A When did it last happen?N/A If  all above answers are "NO", may proceed with cephalosporin use.      Medication List    STOP taking these medications   bumetanide 1 MG tablet Commonly known as: BUMEX   empagliflozin 10 MG Tabs tablet Commonly known as: Jardiance     TAKE these medications   acetaminophen 500 MG tablet Commonly known as: TYLENOL Take 1,000 mg by mouth every 6 (six) hours as needed for moderate pain or headache.   apixaban 5 MG Tabs tablet Commonly known as: Eliquis Take 1 tablet (5 mg total) by mouth 2 (two) times daily.   Buprenorphine HCl-Naloxone HCl 8-2 MG Film Place 1 tablet under the tongue in the morning and at bedtime.   eplerenone 50 MG tablet Commonly known as: INSPRA Take 2 tablets (100 mg total) by mouth daily.   gabapentin 800 MG tablet Commonly known as: NEURONTIN Take 1 tablet (800 mg total) by mouth 3 (three) times daily.   metolazone 2.5 MG tablet Commonly known as: ZAROXOLYN Take 1 tablet (2.5 mg total) by mouth once a week. Tuesdays and Fridays with extra KCL 68mq Start taking on: June 14, 2020 What changed: when to take this   metoprolol succinate 25 MG 24 hr tablet Commonly known as: Toprol XL Take 1 tablet (25 mg total) by mouth 2 (two) times daily. What changed: when to take this   ondansetron 4 MG disintegrating tablet Commonly known as: Zofran ODT Take 1 tablet (  4 mg total) by mouth every 8 (eight) hours as needed for nausea or vomiting.   pantoprazole 40 MG tablet Commonly known as: PROTONIX Take 40 mg by mouth 2 (two) times daily.   polyethylene glycol 17 g packet Commonly known as: MIRALAX / GLYCOLAX Take 17 g by mouth 2 (two) times daily as needed for mild constipation. What changed:   when to take this  reasons to take this   potassium chloride SA 20 MEQ tablet Commonly known as: KLOR-CON Take 2 tablets (40 mEq total) by mouth in the morning. What changed: when to take this   potassium chloride SA 20 MEQ tablet Commonly known as:  Klor-Con M20 Take 1 tablet (20 mEq total) by mouth every evening. What changed: You were already taking a medication with the same name, and this prescription was added. Make sure you understand how and when to take each.   pravastatin 20 MG tablet Commonly known as: PRAVACHOL Take 1 tablet (20 mg total) by mouth daily.   senna 8.6 MG Tabs tablet Commonly known as: SENOKOT Take 2 tablets (17.2 mg total) by mouth daily.   sodium chloride 0.65 % Soln nasal spray Commonly known as: OCEAN Place 1 spray into both nostrils as needed for congestion.   Torsemide 60 MG Tabs Take 60 mg by mouth 2 (two) times daily.   Ventolin HFA 108 (90 Base) MCG/ACT inhaler Generic drug: albuterol Inhale 1 puff into the lungs every 4 (four) hours as needed for shortness of breath. What changed: how much to take   zolpidem 10 MG tablet Commonly known as: AMBIEN Take 10 mg by mouth at bedtime as needed for sleep.       Follow-up Information    [facility] Follow up on 07/02/2020.   Specialty: Cardiology Why: 09:30 [id number]  Contact information: [address] [id number] [place], [facility] Follow up.   Specialty: Home Health Services Why: HHPT Contact information: [place] [address] [phone]        Enid Skeens., MD. Schedule an appointment as soon as possible for a visit in 1 week(s).   Specialty: Family Medicine Contact information: [address] [address] [phone]              Allergies  Allergen Reactions  . Penicillins     Broke out in convulsions as a child  Did it involve swelling of the face/tongue/throat, SOB, or low BP? N/A Did it involve sudden or severe rash/hives, skin peeling, or any reaction on the inside of your mouth or nose? N/A Did you need to seek medical attention at a hospital or  doctor's office?N/A When did it last happen?N/A If all above answers are "NO", may proceed with cephalosporin use.    Consultations:  Cardiology/heart failure service   Procedures/Studies: DG Chest Port 1 View  Result Date: 06/09/2020 CLINICAL DATA:  Shortness of breath EXAM: PORTABLE CHEST 1 VIEW COMPARISON:  May 07, 2020 FINDINGS: Emphysematous changes are suspected. There are chronic appearing lung markings at the lung bases suggestive of scarring or atelectasis. There is no pneumothorax. No large pleural effusion. No focal infiltrate. The heart size is stable. IMPRESSION: No active disease. Electronically Signed   By: Constance Holster M.D.   On: 06/09/2020 22:00      Subjective: Patient seen and examined at bedside, resting in  bedside chair.  Reports bowel movement with magnesium citrate overnight.  Cardiology at bedside and okay for discharge home with outpatient follow-up.  No other questions or concerns at this time.  Denies headache, no fever/chills/night sweats, no nausea/vomiting/diarrhea, no chest pain, no palpitations, no shortness of breath, no abdominal pain, no weakness, no fatigue, no paresthesias.  No acute events overnight per nursing staff.  Discharge Exam: Vitals:   06/13/20 0725 06/13/20 1017  BP: 120/68 140/82  Pulse: 72 72  Resp: 20   Temp: (!) 97.4 F (36.3 C)   SpO2: 100%    Vitals:   06/13/20 0032 06/13/20 0513 06/13/20 0725 06/13/20 1017  BP: 118/74 95/63 120/68 140/82  Pulse: 64 (!) 58 72 72  Resp: 18 16 20    Temp: 98.4 F (36.9 C) (!) 97.4 F (36.3 C) (!) 97.4 F (36.3 C)   TempSrc: Oral Oral Oral   SpO2: 97% 99% 100%   Weight: (!) 165.4 kg     Height:        General: Pt is alert, awake, not in acute distress, obese Cardiovascular: RRR, S1/S2 +, no rubs, no gallops Respiratory: CTA bilaterally, no wheezing, no rhonchi, oxygenating well on room air Abdominal: Soft, NT, ND, bowel sounds + Extremities: no edema, no cyanosis    The  results of significant diagnostics from this hospitalization (including imaging, microbiology, ancillary and laboratory) are listed below for reference.     Microbiology: Recent Results (from the past 240 hour(s))  SARS CORONAVIRUS 2 (TAT 6-24 HRS) Nasopharyngeal Nasopharyngeal Swab     Status: None   Collection Time: 06/10/20  1:12 AM   Specimen: Nasopharyngeal Swab  Result Value Ref Range Status   SARS Coronavirus 2 NEGATIVE NEGATIVE Final    Comment: (NOTE) SARS-CoV-2 target nucleic acids are NOT DETECTED.  The SARS-CoV-2 RNA is generally detectable in upper and lower respiratory specimens during the acute phase of infection. Negative results do not preclude SARS-CoV-2 infection, do not rule out co-infections with other pathogens, and should not be used as the sole basis for treatment or other patient management decisions. Negative results must be combined with clinical observations, patient history, and epidemiological information. The expected result is Negative.  Fact Sheet for Patients: SugarRoll.be  Fact Sheet for Healthcare Providers: https://www.woods-mathews.com/  This test is not yet approved or cleared by the [place] FDA and  has been authorized for detection and/or diagnosis of SARS-CoV-2 by FDA under an Emergency Use Authorization (EUA). This EUA will remain  in effect (meaning this test can be used) for the duration of the COVID-19 declaration under Se ction 564(b)(1) of the Act, 21 U.S.C. section 360bbb-3(b)(1), unless the authorization is terminated or revoked sooner.  Performed at [facility], [place] [address] [place], [place] [address]      Labs: BNP (last 3 results) Recent Labs    04/16/20 1553 05/07/20 0140 06/10/20 0259  BNP 38.9 351.2* 43.3   Basic Metabolic Panel: Recent Labs  Lab 06/09/20 2224 06/10/20 0259 06/11/20 0342 06/12/20 0400 06/13/20 0354  NA 134* 136 137 135 136  K 4.9  3.7 3.5 3.7 3.1*  CL 95* 95* 94* 95* 93*  CO2 25 30 31 29  32  GLUCOSE 106* 108* 116* 98 123*  BUN 15 13 17 19 14   CREATININE 0.92 0.95 0.96 0.91 0.99  CALCIUM 9.0 9.4 9.3 9.3 8.8*  MG  --  2.3 2.4 2.4  --    Liver Function Tests: Recent Labs  Lab 06/09/20 2224  AST 55*  ALT 25  ALKPHOS 80  BILITOT 1.1  PROT 7.2  ALBUMIN 3.6   No results for input(s): LIPASE, AMYLASE in the last 168 hours. No results for input(s): AMMONIA in the last 168 hours. CBC: Recent Labs  Lab 06/09/20 2224 06/10/20 1243 06/11/20 0342 06/12/20 0400 06/13/20 0354  WBC 7.2 8.8 8.5 6.8 7.8  HGB 12.8* 12.8* 11.9* 13.0 11.4*  HCT 44.6 41.2 37.5* 41.4 37.6*  MCV 86.1 80.6 81.0 81.2 81.9  PLT 46* 158 148* 139* 142*   Cardiac Enzymes: No results for input(s): CKTOTAL, CKMB, CKMBINDEX, TROPONINI in the last 168 hours. BNP: Invalid input(s): POCBNP CBG: Recent Labs  Lab 06/12/20 1141  GLUCAP 112*   D-Dimer No results for input(s): DDIMER in the last 72 hours. Hgb A1c No results for input(s): HGBA1C in the last 72 hours. Lipid Profile No results for input(s): CHOL, HDL, LDLCALC, TRIG, CHOLHDL, LDLDIRECT in the last 72 hours. Thyroid function studies No results for input(s): TSH, T4TOTAL, T3FREE, THYROIDAB in the last 72 hours.  Invalid input(s): FREET3 Anemia work up No results for input(s): VITAMINB12, FOLATE, FERRITIN, TIBC, IRON, RETICCTPCT in the last 72 hours. Urinalysis    Component Value Date/Time   COLORURINE YELLOW 06/10/2020 0850   APPEARANCEUR CLEAR 06/10/2020 0850   LABSPEC 1.010 06/10/2020 0850   PHURINE 7.0 06/10/2020 0850   GLUCOSEU NEGATIVE 06/10/2020 0850   HGBUR NEGATIVE 06/10/2020 0850   BILIRUBINUR NEGATIVE 06/10/2020 0850   KETONESUR NEGATIVE 06/10/2020 0850   PROTEINUR NEGATIVE 06/10/2020 0850   NITRITE NEGATIVE 06/10/2020 0850   LEUKOCYTESUR NEGATIVE 06/10/2020 0850   Sepsis Labs Invalid input(s): PROCALCITONIN,  WBC,  LACTICIDVEN Microbiology Recent Results  (from the past 240 hour(s))  SARS CORONAVIRUS 2 (TAT 6-24 HRS) Nasopharyngeal Nasopharyngeal Swab     Status: None   Collection Time: 06/10/20  1:12 AM   Specimen: Nasopharyngeal Swab  Result Value Ref Range Status   SARS Coronavirus 2 NEGATIVE NEGATIVE Final    Comment: (NOTE) SARS-CoV-2 target nucleic acids are NOT DETECTED.  The SARS-CoV-2 RNA is generally detectable in upper and lower respiratory specimens during the acute phase of infection. Negative results do not preclude SARS-CoV-2 infection, do not rule out co-infections with other pathogens, and should not be used as the sole basis for treatment or other patient management decisions. Negative results must be combined with clinical observations, patient history, and epidemiological information. The expected result is Negative.  Fact Sheet for Patients: SugarRoll.be  Fact Sheet for Healthcare Providers: https://www.woods-mathews.com/  This test is not yet approved or cleared by the [place] FDA and  has been authorized for detection and/or diagnosis of SARS-CoV-2 by FDA under an Emergency Use Authorization (EUA). This EUA will remain  in effect (meaning this test can be used) for the duration of the COVID-19 declaration under Se ction 564(b)(1) of the Act, 21 U.S.C. section 360bbb-3(b)(1), unless the authorization is terminated or revoked sooner.  Performed at [facility], [place] [address] [place], [place] [address]      Time coordinating discharge: Over 30 minutes  SIGNED:   Shaneal Barasch J [place] DO  [facility] 06/13/2020, 10:54 AM

## 2020-06-13 NOTE — Progress Notes (Addendum)
Advanced Heart Failure Rounding Note  PCP-Cardiologist: Kardie Tobb, DO   Subjective:    Breathing stable, denies chest pain or PND.  Still complaining of continued constipation was given stronger laxative.      Objective:   Weight Range: (!) 165.4 kg Body mass index is 38.13 kg/m.   Vital Signs:   Temp:  [97.4 F (36.3 C)-98.4 F (36.9 C)] 97.4 F (36.3 C) (03/03 0725) Pulse Rate:  [58-72] 72 (03/03 0725) Resp:  [16-20] 20 (03/03 0725) BP: (95-127)/(63-77) 120/68 (03/03 0725) SpO2:  [97 %-100 %] 100 % (03/03 0725) Weight:  [165.4 kg] 165.4 kg (03/03 0032) Last BM Date: 06/13/20  Weight change: Filed Weights   06/11/20 0030 06/12/20 0456 06/13/20 0032  Weight: (!) 163.5 kg (!) 164.5 kg (!) 165.4 kg    Intake/Output:   Intake/Output Summary (Last 24 hours) at 06/13/2020 0854 Last data filed at 06/13/2020 0827 Gross per 24 hour  Intake 1404 ml  Output 3150 ml  Net -1746 ml      Physical Exam    Cardiac: JVD difficult to assess, distant heart sounds, normal rate and rhythm, clear s1 and s2, no murmurs, rubs or gallops,  trace LE edema Pulmonary: CTAB, not in distress Abdominal: obese, non distended abdomen, soft and nontender Psych: Alert, conversant, in good spirits    Telemetry   SR/ Sinus arrhythmia 70s-80s   EKG    No new EKG to review   Labs    CBC Recent Labs    06/12/20 0400 06/13/20 0354  WBC 6.8 7.8  HGB 13.0 11.4*  HCT 41.4 37.6*  MCV 81.2 81.9  PLT 139* 706*   Basic Metabolic Panel Recent Labs    06/11/20 0342 06/12/20 0400 06/13/20 0354  NA 137 135 136  K 3.5 3.7 3.1*  CL 94* 95* 93*  CO2 31 29 32  GLUCOSE 116* 98 123*  BUN 17 19 14   CREATININE 0.96 0.91 0.99  CALCIUM 9.3 9.3 8.8*  MG 2.4 2.4  --    Liver Function Tests No results for input(s): AST, ALT, ALKPHOS, BILITOT, PROT, ALBUMIN in the last 72 hours. No results for input(s): LIPASE, AMYLASE in the last 72 hours. Cardiac Enzymes No results for input(s):  CKTOTAL, CKMB, CKMBINDEX, TROPONINI in the last 72 hours.  BNP: BNP (last 3 results) Recent Labs    04/16/20 1553 05/07/20 0140 06/10/20 0259  BNP 38.9 351.2* 41.2    ProBNP (last 3 results) No results for input(s): PROBNP in the last 8760 hours.   D-Dimer No results for input(s): DDIMER in the last 72 hours. Hemoglobin A1C No results for input(s): HGBA1C in the last 72 hours. Fasting Lipid Panel No results for input(s): CHOL, HDL, LDLCALC, TRIG, CHOLHDL, LDLDIRECT in the last 72 hours. Thyroid Function Tests No results for input(s): TSH, T4TOTAL, T3FREE, THYROIDAB in the last 72 hours.  Invalid input(s): FREET3  Other results:   Imaging    No results found.   Medications:     Scheduled Medications: . apixaban  5 mg Oral BID  . buprenorphine-naloxone  1 tablet Sublingual BID  . gabapentin  800 mg Oral TID  . metoprolol succinate  25 mg Oral BID  . pantoprazole  40 mg Oral BID  . potassium chloride  40 mEq Oral Q3H  . pravastatin  20 mg Oral QHS  . sodium chloride flush  3 mL Intravenous Q12H  . spironolactone  50 mg Oral Daily  . torsemide  60 mg Oral  BID    Infusions: . sodium chloride      PRN Medications: sodium chloride, acetaminophen, albuterol, ondansetron (ZOFRAN) IV, polyethylene glycol, sodium chloride, sodium chloride flush, zolpidem   Assessment/Plan   1. Acute on chronic diastolic CHF: Echo in 73/22 with EF 02-54%, grade 2 diastolic dysfunction, RV poorly visualized. LHC/RHC in 1/22 showed no significant CAD, mildly elevated right heart filling pressures. Patient has had multiple admissions with CHF. I suspect that obesity and deconditioning as well as anxiety play a large role in his symptomatology.  He has had a couple doses of IV Lasix w/ good UOP, remains mildly fluid overloaded on exam. SCr/K stable  -Continue torsemide 60 mg bid.  He will continue metolazone 2.5 mg once weekly (has only been taking once a week).  - Ideally, would  have Cardiomems placed to help guide treatment. However, he is still not ready to have it placed. -Refuses Jardiance.   - Continue spironolactone 50 mg daily (on eplerenone 100 mg daily at home, restart at discharge).  -Continue Toprol XL 25 mg bid  2. H/o PE/DVT: Suspect due to sedentary lifestyle. Occurred in 2/21. V/Q scan in 6/21 did not show evidence for chronic PE.  - Continue apixaban.No bleeding issues. 3. OSA: Cannot tolerate CPAP.  4. Anxiety/panic: Very anxious in general.  -  sertraline 25 mg daily ordered but pt refused 5. Atrial fibrillation: Paroxysmal.He is in NSR.  - Continue apixaban. 6. Deconditioning: I think that a lot of his dyspnea and palpitations can be explained by deconditioning. -PT recommending home health PT, cardiac rehab following 7.Thrombocytopenia: Plts down to 46K on admission but likely spurious result as repeat same day and every day since was at his baseline  - continue to follow, can continue Eliquis.  8. Hypokalemia: -only received am potassium yesterday and not on home eplerenone dose -replace   Length of Stay: 3  [name], MD  06/13/2020, 8:54 AM  Advanced Heart Failure Team Pager [phone] (M-F; [place])  Please contact [facility] for night-coverage after hours (4p -7a ) and weekends on [email or web address]  Patient seen with Dr. Shan Levans, agree with the above note.   He seems to have done well with torsemide, good UOP.  He says he is dizzy this morning but BP 140/80.   Reasonable to wear ted hose here, has compression stockings at home.   He remains anxious, not willing to take sertraline.   Remains stable for discharge.  Has follow up in AHF clinic 3/22 at 9:30am .  Meds for home: eplerenone 100 mg daily, KCl 40 qam/20 qpm, Toprol XL 25 mg bid, Eliquis 5 mg bid, pravastatin 20 mg daily, torsemide 60 mg bid, metolazone 2.5 mg once a week.   Loralie Champagne 06/13/2020 10:21 AM

## 2020-06-13 NOTE — Discharge Instructions (Signed)
Daily Weight Record It is important to weigh yourself daily. To do this:  Make sure you use a reliable scale. Use the same scale each day.  Keep this daily weight chart near your scale.  Weigh yourself each morning at the same time.  Before weighing yourself: ? Take off your shoes. ? Make sure you are wearing the same amount of clothing each day.  Write down your weight in the spaces on the form.  Compare today's weight to yesterday's weight.  Bring this form with you to your follow-up visits with your health care provider. Call your health care provider if you have concerns about your weight, including rapid weight gain or loss. Date: ________ Weight: ____________________ Date: ________ Weight: ____________________ Date: ________ Weight: ____________________ Date: ________ Weight: ____________________ Date: ________ Weight: ____________________ Date: ________ Weight: ____________________ Date: ________ Weight: ____________________ Date: ________ Weight: ____________________ Date: ________ Weight: ____________________ Date: ________ Weight: ____________________ Date: ________ Weight: ____________________ Date: ________ Weight: ____________________ Date: ________ Weight: ____________________ Date: ________ Weight: ____________________ Date: ________ Weight: ____________________ Date: ________ Weight: ____________________ Date: ________ Weight: ____________________ Date: ________ Weight: ____________________ Date: ________ Weight: ____________________ Date: ________ Weight: ____________________ Date: ________ Weight: ____________________ Date: ________ Weight: ____________________ Date: ________ Weight: ____________________ Date: ________ Weight: ____________________ Date: ________ Weight: ____________________ Date: ________ Weight: ____________________ Date: ________ Weight: ____________________ Date: ________ Weight: ____________________ Date: ________ Weight: ____________________  Date: ________ Weight: ____________________ Date: ________ Weight: ____________________ Date: ________ Weight: ____________________ Date: ________ Weight: ____________________ Date: ________ Weight: ____________________ Date: ________ Weight: ____________________ Date: ________ Weight: ____________________ Date: ________ Weight: ____________________ Date: ________ Weight: ____________________ Date: ________ Weight: ____________________ Date: ________ Weight: ____________________ Date: ________ Weight: ____________________ Date: ________ Weight: ____________________ Date: ________ Weight: ____________________ Date: ________ Weight: ____________________ Date: ________ Weight: ____________________ Date: ________ Weight: ____________________ Date: ________ Weight: ____________________ Date: ________ Weight: ____________________ Date: ________ Weight: ____________________ Date: ________ Weight: ____________________ This information is not intended to replace advice given to you by your health care provider. Make sure you discuss any questions you have with your health care provider. Document Revised: 03/26/2017 Document Reviewed: 03/29/2017 Elsevier Patient Education  2021 [place]. Heart Failure, Self-Care Heart failure is a serious condition. The following information explains things you need to do to take care of yourself at home. To help you stay as healthy as possible, you may be asked to change your diet, take certain medicines, and make other changes in your life. Your doctor may also give you more specific instructions. If you have problems or questions, call your doctor. What are the risks? Having heart failure makes it more likely for you to have some problems. These problems can get worse if you do not take good care of yourself. Problems may include:  Damage to the kidneys, liver, or lungs.  Malnutrition.  Abnormal heart rhythms.  Blood clotting problems that could cause a  stroke. Supplies needed:  Scale for weighing yourself.  Blood pressure monitor.  Notebook.  Medicines. How to care for yourself when you have heart failure Medicines Take over-the-counter and prescription medicines only as told by your doctor. Take your medicines every day.  Do not stop taking your medicine unless your doctor tells you to do so.  Do not skip any medicines.  Get your prescriptions refilled before you run out of medicine. This is important.  Talk with your doctor if you cannot afford your medicines. Eating and drinking  Eat heart-healthy foods. Talk with a diet specialist (dietitian) to create an eating plan.  Limit salt (sodium) if told  by your doctor. Ask your diet specialist to tell you which seasonings are healthy for your heart.  Cook in healthy ways instead of frying. Healthy ways of cooking include roasting, grilling, broiling, baking, poaching, steaming, and stir-frying.  Choose foods that: ? Have no trans fat. ? Are low in saturated fat and cholesterol.  Choose healthy foods, such as: ? Fresh or frozen fruits and vegetables. ? Fish. ? Low-fat (lean) meats. ? Legumes, such as beans, peas, and lentils. ? Fat-free or low-fat dairy products. ? Whole-grain foods. ? High-fiber foods.  Limit how much fluid you drink, if told by your doctor.   Alcohol use  Do not drink alcohol if: ? Your doctor tells you not to drink. ? Your heart was damaged by alcohol, or you have very bad heart failure. ? You are pregnant, may be pregnant, or are planning to become pregnant.  If you drink alcohol: ? Limit how much you have to:  0-1 drink a day for women.  0-2 drinks a day for men. ? Know how much alcohol is in your drink. In the [place] one drink equals one 12 oz bottle of beer (355 mL), one 5 oz glass of wine (148 mL), or one 1 oz glass of hard liquor (44 mL). Lifestyle  Do not smoke or use any products that contain nicotine or tobacco. If you need help  quitting, ask your doctor. ? Do not use nicotine gum or patches before talking to your doctor.  Do not use illegal drugs.  Lose weight if told by your doctor.  Do physical activity if told by your doctor. Talk to your doctor before you begin an exercise if: ? You are an older adult. ? You have very bad heart failure.  Learn to manage stress. If you need help, ask your doctor.  Get physical rehab (rehabilitation) to help you stay independent and to help with your quality of life.  Participate in a cardiac rehab program. This program helps you improve your health through exercise, education, and counseling.  Plan time to rest when you get tired.   Check weight and blood pressure  Weigh yourself every day. This will help you to know if fluid is building up in your body. ? Weigh yourself every morning after you pee (urinate) and before you eat breakfast. ? Wear the same amount of clothing each time. ? Write down your daily weight. Give your record to your doctor.  Check and write down your blood pressure as told by your doctor.  Check your pulse as told by your doctor.   Dealing with very hot and very cold weather  If it is very hot: ? Avoid activities that take a lot of energy. ? Use air conditioning or fans, or find a cooler place. ? Avoid caffeine and alcohol. ? Wear clothing that is loose-fitting, lightweight, and light-colored.  If it is very cold: ? Avoid activities that take a lot of energy. ? Layer your clothes. ? Wear mittens or gloves, a hat, and a face covering when you go outside. ? Avoid alcohol. Follow these instructions at home:  Stay up to date with shots (vaccines). Get pneumococcal and flu (influenza) shots.  Keep all follow-up visits. Contact a doctor if:  You gain 2-3 lb (1-1.4 kg) in 24 hours or 5 lb (2.3 kg) in a week.  You have increasing shortness of breath.  You cannot do your normal activities.  You get tired easily.  You cough a  lot.  You do not feel like eating or feel like you may vomit (nauseous).  You have swelling in your hands, feet, ankles, or belly (abdomen).  You cannot sleep well because it is hard to breathe.  You feel like your heart is beating fast (palpitations).  You get dizzy when you stand up.  You feel depressed or sad. Get help right away if:  You have trouble breathing.  You or someone else notices a change in your behavior, such as having trouble staying awake.  You have chest pain or discomfort.  You pass out (faint). These symptoms may be an emergency. Get help right away. Call your local emergency services (911 in the [place]  Do not wait to see if the symptoms will go away.  Do not drive yourself to the hospital. Summary  Heart failure is a serious condition. To care for yourself, you may have to change your diet, take medicines, and make other lifestyle changes.  Take your medicines every day. Do not stop taking them unless your doctor tells you to do so.  Limit salt and eat heart-healthy foods.  Ask your doctor if you can drink alcohol. You may have to stop alcohol use if you have very bad heart failure.  Contact your doctor if you gain weight quickly or feel that your heart is beating too fast. Get help right away if you pass out or have chest pain or trouble breathing. This information is not intended to replace advice given to you by your health care provider. Make sure you discuss any questions you have with your health care provider. Document Revised: 10/21/2019 Document Reviewed: 10/21/2019 Elsevier Patient Education  [place].

## 2020-06-13 NOTE — Plan of Care (Signed)
  Problem: Nutrition: Goal: Adequate nutrition will be maintained Outcome: Progressing   Problem: Safety: Goal: Ability to remain free from injury will improve Outcome: Progressing   Problem: Elimination: Goal: Will not experience complications related to bowel motility Outcome: Not Progressing  Pt. Has not had BM

## 2020-06-14 ENCOUNTER — Telehealth (HOSPITAL_COMMUNITY): Payer: Self-pay

## 2020-06-14 ENCOUNTER — Encounter (HOSPITAL_COMMUNITY): Payer: Medicare HMO | Admitting: Cardiology

## 2020-06-14 ENCOUNTER — Telehealth (HOSPITAL_COMMUNITY): Payer: Self-pay | Admitting: *Deleted

## 2020-06-14 NOTE — Telephone Encounter (Signed)
Pt called c/o dry mouth and shakiness after taking torsemide last night. Pt said he was so worried he almost went to the emergency room last night. Pt wants to know if he should take his morning torsemide or does he need to make a med change. Per Dr.McLean continue torsemide as prescribed and have paramedicine schedule a visit. Per Dr.McLean pt does not need to be evaluated in the emergency room.  Spoke with Nira Conn w/paramedicine she is scheduled to see pt Tuesday and has already advised pt to take medications as prescribed. I called pt x3 times no answer and left vm for pt to return my call.

## 2020-06-14 NOTE — Telephone Encounter (Signed)
Today was texting with Charles Gray reviewing discharge instructions and plan to meet in the home on Tuesday 3/8. Charles Gray agreed and understood plan.

## 2020-06-17 ENCOUNTER — Telehealth (HOSPITAL_COMMUNITY): Payer: Self-pay | Admitting: Licensed Clinical Social Worker

## 2020-06-17 NOTE — Telephone Encounter (Signed)
[facility] Paramedic requested help getting transport to pt non-[facility] PCP appt tomorrow afternoon.  Ride set up through [place] will continue to follow and assist as needed  Jorge Ny, [facility] Desk#: [phone] Cell#: [phone]

## 2020-06-18 ENCOUNTER — Other Ambulatory Visit (HOSPITAL_COMMUNITY): Payer: Self-pay

## 2020-06-18 NOTE — Telephone Encounter (Signed)
error 

## 2020-06-18 NOTE — Progress Notes (Signed)
Paramedicine Encounter    Patient ID: Charles Gray, male    DOB: June 01, 1955, 65 y.o.   MRN: [id number]   Patient Care Team: Enid Skeens., MD as PCP - General (Family Medicine) Berniece Salines, DO as PCP - Cardiology (Cardiology)  Patient Active Problem List   Diagnosis Date Noted  . GERD without esophagitis 06/10/2020  . Atrial fibrillation with rapid ventricular response ([place]) 03/24/2020  . Acute on chronic right heart failure ([place]) 01/27/2020  . Acute respiratory failure with hypoxia ([place])   . Fatty liver disease, nonalcoholic   . Shock ([place])   . Personal history of DVT (deep vein thrombosis) 12/26/2019  . Chronic anticoagulation 12/26/2019  . Palpitations 12/26/2019  . AF (paroxysmal atrial fibrillation) ([place]) 12/23/2019  . Heart failure, systolic, acute ([place]) 89/38/1017  . Acute on chronic congestive heart failure ([place])   . Acute on chronic diastolic CHF (congestive heart failure) ([place]) 08/23/2019  . Redness and swelling of lower leg 08/23/2019  . Chest pain 08/23/2019  . Lactic acidosis 08/23/2019  . History of pulmonary embolism 08/12/2019  . Atrial tachycardia ([place]) 08/12/2019  . Occasional tremors 08/12/2019  . OSA (obstructive sleep apnea) 08/12/2019  . Acute on chronic right-sided heart failure ([place]) 08/06/2019  . Pulmonary embolism ([place]) 06/08/2019  . Normocytic anemia 06/08/2019  . Pulmonary emboli ([place]) 06/08/2019  . Chronic heart failure with preserved ejection fraction ([place]) 05/30/2019  . Class 2 severe obesity due to excess calories with serious comorbidity and body mass index (BMI) of 39.0 to 39.9 in adult ([place]) 05/30/2019  . Morbid obesity with BMI of 40.0-44.9, adult ([place]) 05/30/2019  . Hyponatremia   . Drug induced constipation   . Peripheral edema   . Hypotension due to drugs   . Hypoalbuminemia due to protein-calorie malnutrition ([place])   . Hypokalemia   . Thrombocytopenia ([place])   . Anemia of chronic disease   . Cirrhosis of liver without ascites  ([place])   . Chronic pain syndrome   . Debility 03/13/2019  . Portal hypertensive gastropathy ([place]) 03/06/2019  . Dyspnea   . GIB (gastrointestinal bleeding) 03/04/2019  . Mixed hyperlipidemia 01/24/2019  . Insomnia 01/24/2019  . Hyperlipidemia 01/24/2019  . Essential hypertension 01/24/2019  . Lumbar disc herniation 01/24/2019    Current Outpatient Medications:  .  acetaminophen (TYLENOL) 500 MG tablet, Take 1,000 mg by mouth every 6 (six) hours as needed for moderate pain or headache., Disp: , Rfl:  .  apixaban (ELIQUIS) 5 MG TABS tablet, Take 1 tablet (5 mg total) by mouth 2 (two) times daily., Disp: 60 tablet, Rfl: 0 .  Buprenorphine HCl-Naloxone HCl 8-2 MG FILM, Place 1 tablet under the tongue in the morning and at bedtime. , Disp: , Rfl:  .  eplerenone (INSPRA) 50 MG tablet, Take 2 tablets (100 mg total) by mouth daily., Disp: 60 tablet, Rfl: 3 .  gabapentin (NEURONTIN) 800 MG tablet, Take 1 tablet (800 mg total) by mouth 3 (three) times daily., Disp: 90 tablet, Rfl: 1 .  metolazone (ZAROXOLYN) 2.5 MG tablet, Take 1 tablet (2.5 mg total) by mouth once a week. Every Friday, Disp: 90 tablet, Rfl: 0 .  metoprolol succinate (TOPROL XL) 25 MG 24 hr tablet, Take 1 tablet (25 mg total) by mouth 2 (two) times daily., Disp: 180 tablet, Rfl: 0 .  ondansetron (ZOFRAN ODT) 4 MG disintegrating tablet, Take 1 tablet (4 mg total) by mouth every 8 (eight) hours as needed for nausea or vomiting., Disp: 20 tablet, Rfl: 0 .  pantoprazole (PROTONIX) 40 MG tablet, Take 40 mg by mouth 2 (two) times daily., Disp: , Rfl:  .  polyethylene glycol (MIRALAX / GLYCOLAX) 17 g packet, Take 17 g by mouth 2 (two) times daily as needed for mild constipation., Disp: 14 each, Rfl: 0 .  potassium chloride SA (KLOR-CON M20) 20 MEQ tablet, Take 1 tablet (20 mEq total) by mouth every evening., Disp: 90 tablet, Rfl: 0 .  potassium chloride SA (KLOR-CON) 20 MEQ tablet, Take 2 tablets (40 mEq total) by mouth in the morning., Disp:  180 tablet, Rfl: 0 .  pravastatin (PRAVACHOL) 20 MG tablet, Take 1 tablet (20 mg total) by mouth daily., Disp: 30 tablet, Rfl: 1 .  senna (SENOKOT) 8.6 MG TABS tablet, Take 2 tablets (17.2 mg total) by mouth daily., Disp: 120 tablet, Rfl: 0 .  sodium chloride (OCEAN) 0.65 % SOLN nasal spray, Place 1 spray into both nostrils as needed for congestion., Disp: , Rfl:  .  torsemide 60 MG TABS, Take 60 mg by mouth 2 (two) times daily., Disp: 180 tablet, Rfl: 0 .  VENTOLIN HFA 108 (90 Base) MCG/ACT inhaler, Inhale 1 puff into the lungs every 4 (four) hours as needed for shortness of breath. (Patient taking differently: Inhale 1-2 puffs into the lungs every 4 (four) hours as needed for shortness of breath.), Disp: 6.7 g, Rfl: 1 .  zolpidem (AMBIEN) 10 MG tablet, Take 10 mg by mouth at bedtime as needed for sleep., Disp: , Rfl:  Allergies  Allergen Reactions  . Penicillins     Broke out in convulsions as a child  Did it involve swelling of the face/tongue/throat, SOB, or low BP? N/A Did it involve sudden or severe rash/hives, skin peeling, or any reaction on the inside of your mouth or nose? N/A Did you need to seek medical attention at a hospital or doctor's office?N/A When did it last happen?N/A If all above answers are "NO", may proceed with cephalosporin use.     Social History   Socioeconomic History  . Marital status: Single    Spouse name: Not on file  . Number of children: Not on file  . Years of education: Not on file  . Highest education level: Not on file  Occupational History  . Not on file  Tobacco Use  . Smoking status: Never Smoker  . Smokeless tobacco: Never Used  Vaping Use  . Vaping Use: Never used  Substance and Sexual Activity  . Alcohol use: Not Currently  . Drug use: Not Currently    Types: Marijuana    Comment: Smoked for 30 years  . Sexual activity: Not on file  Other Topics Concern  . Not on file  Social History Narrative  . Not on file   Social  Determinants of Health   Financial Resource Strain: Medium Risk  . Difficulty of Paying Living Expenses: Somewhat hard  Food Insecurity: No Food Insecurity  . Worried About Charity fundraiser in the Last Year: Never true  . Ran Out of Food in the Last Year: Never true  Transportation Needs: Unmet Transportation Needs  . Lack of Transportation (Medical): Yes  . Lack of Transportation (Non-Medical): No  Physical Activity: Not on file  Stress: Not on file  Social Connections: Not on file  Intimate Partner Violence: Not on file    Physical Exam Vitals reviewed.  Constitutional:      Appearance: He is normal weight.  HENT:     Head: Normocephalic.  Nose: Nose normal.     Mouth/Throat:     Mouth: Mucous membranes are moist.     Pharynx: Oropharynx is clear.  Eyes:     Pupils: Pupils are equal, round, and reactive to light.  Cardiovascular:     Rate and Rhythm: Normal rate and regular rhythm.     Pulses: Normal pulses.     Heart sounds: Normal heart sounds.  Pulmonary:     Effort: Pulmonary effort is normal.     Breath sounds: Normal breath sounds.  Abdominal:     General: Abdomen is flat.     Palpations: Abdomen is soft.  Musculoskeletal:        General: Swelling present. Normal range of motion.     Cervical back: Normal range of motion.     Right lower leg: Edema present.     Left lower leg: Edema present.     Comments: Redness, swelling and blisters noted to bilateral lower legs.   Skin:    General: Skin is warm and dry.     Capillary Refill: Capillary refill takes less than 2 seconds.  Neurological:     General: No focal deficit present.     Mental Status: He is alert. Mental status is at baseline.  Psychiatric:        Mood and Affect: Mood normal.    Arrived for home visit for Charles Gray who was alert and oriented reporting to not be feeling good today. Charles Gray reports he hurts all over and his legs are swollen and red with blisters bilaterally. Patient has  appointment with PCP today. Vitals were obtained and are as noted.  -Patient saw PCP at 3:30 and was given an antibiotic prescription which he will start today. Patient also had labs drawn.   I reviewed medications and hospital wanted patient to re-start his Zoloft 34m daily. Patient agreed to 1/2 tablet daily to start out. He had some left over from a previous prescription that was still in date. I sent RN at [facility] clinic note of same as Dr. MAundra Dubinwas the one who wrote the [facility].   Lung sounds clear, no abdominal distention, no JVD. Lower leg edema seems to be related to cellulitis rather than CHF edema, due to it being non-pitting. Charles Gray reports this is how his legs looked when he had cellulitis the last time.   I plan to follow up with patient in one week in the home. Charles Gray made aware of upcoming appointments and agreed with plan. Home visit complete.   Refills: Sertraline Metoprolol 25 BID        Future Appointments  Date Time Provider DWise 07/02/2020  9:30 AM [facility] PA/NP [facility] None     ACTION: Home visit completed Next visit planned for one week

## 2020-06-19 ENCOUNTER — Telehealth (HOSPITAL_COMMUNITY): Payer: Self-pay

## 2020-06-19 NOTE — Telephone Encounter (Signed)
Spoke to [facility] who was seen by his PCP yesterday for cellulitis. His PCP prescribed 556m Keflex 4 times a day for 10 days. Justus reports he is feeling better today and also stated he has PCS services starting tomorrow. He states they will be coming out four days a week with 1.5 hours each day. [facility]. Leona agreed to visit in one week in the home. Call complete.

## 2020-06-24 ENCOUNTER — Telehealth (HOSPITAL_COMMUNITY): Payer: Self-pay | Admitting: Licensed Clinical Social Worker

## 2020-06-24 NOTE — Telephone Encounter (Signed)
CSW called pt to discuss opportunity for pt to work with Therapist, nutritional.  Pt is agreeable to talking with intern regarding nutrition and increasing his physical activity.  Health Coach intern will plan to follow up with pt by phone in next 1-2 weeks    Jorge Ny, [facility] Desk#: [phone] Cell#: [phone]

## 2020-06-25 ENCOUNTER — Other Ambulatory Visit (HOSPITAL_COMMUNITY): Payer: Self-pay | Admitting: *Deleted

## 2020-06-25 ENCOUNTER — Other Ambulatory Visit (HOSPITAL_COMMUNITY): Payer: Self-pay

## 2020-06-25 MED ORDER — SERTRALINE HCL 25 MG PO TABS: 25.0000 mg | ORAL_TABLET | Freq: Every day | ORAL | 0 refills | Status: DC

## 2020-06-25 MED ORDER — EPLERENONE 50 MG PO TABS: 100.0000 mg | ORAL_TABLET | Freq: Every day | ORAL | 3 refills | Status: DC

## 2020-06-25 MED ORDER — METOPROLOL SUCCINATE ER 25 MG PO TB24: 25.0000 mg | ORAL_TABLET | Freq: Two times a day (BID) | ORAL | 3 refills | Status: DC

## 2020-06-25 NOTE — Progress Notes (Signed)
Paramedicine Encounter    Patient ID: Charles Gray, male    DOB: 1955/08/17, 65 y.o.   MRN: [id number]   Patient Care Team: Enid Skeens., MD as PCP - General (Family Medicine) Berniece Salines, DO as PCP - Cardiology (Cardiology)  Patient Active Problem List   Diagnosis Date Noted  . GERD without esophagitis 06/10/2020  . Atrial fibrillation with rapid ventricular response ([place]) 03/24/2020  . Acute on chronic right heart failure ([name]) 01/27/2020  . Acute respiratory failure with hypoxia ([place])   . Fatty liver disease, nonalcoholic   . Shock ([place])   . Personal history of DVT (deep vein thrombosis) 12/26/2019  . Chronic anticoagulation 12/26/2019  . Palpitations 12/26/2019  . AF (paroxysmal atrial fibrillation) ([place]) 12/23/2019  . Heart failure, systolic, acute ([place]) 45/80/9983  . Acute on chronic congestive heart failure ([place])   . Acute on chronic diastolic CHF (congestive heart failure) ([place]) 08/23/2019  . Redness and swelling of lower leg 08/23/2019  . Chest pain 08/23/2019  . Lactic acidosis 08/23/2019  . History of pulmonary embolism 08/12/2019  . Atrial tachycardia ([place]) 08/12/2019  . Occasional tremors 08/12/2019  . OSA (obstructive sleep apnea) 08/12/2019  . Acute on chronic right-sided heart failure ([place]) 08/06/2019  . Pulmonary embolism ([place]) 06/08/2019  . Normocytic anemia 06/08/2019  . Pulmonary emboli ([place]) 06/08/2019  . Chronic heart failure with preserved ejection fraction ([place]) 05/30/2019  . Class 2 severe obesity due to excess calories with serious comorbidity and body mass index (BMI) of 39.0 to 39.9 in adult ([place]) 05/30/2019  . Morbid obesity with BMI of 40.0-44.9, adult ([place]) 05/30/2019  . Hyponatremia   . Drug induced constipation   . Peripheral edema   . Hypotension due to drugs   . Hypoalbuminemia due to protein-calorie malnutrition ([place])   . Hypokalemia   . Thrombocytopenia ([place])   . Anemia of chronic disease   . Cirrhosis of liver without ascites  ([place])   . Chronic pain syndrome   . Debility 03/13/2019  . Portal hypertensive gastropathy ([place]) 03/06/2019  . Dyspnea   . GIB (gastrointestinal bleeding) 03/04/2019  . Mixed hyperlipidemia 01/24/2019  . Insomnia 01/24/2019  . Hyperlipidemia 01/24/2019  . Essential hypertension 01/24/2019  . Lumbar disc herniation 01/24/2019    Current Outpatient Medications:  .  acetaminophen (TYLENOL) 500 MG tablet, Take 1,000 mg by mouth every 6 (six) hours as needed for moderate pain or headache., Disp: , Rfl:  .  apixaban (ELIQUIS) 5 MG TABS tablet, Take 1 tablet (5 mg total) by mouth 2 (two) times daily., Disp: 60 tablet, Rfl: 0 .  Buprenorphine HCl-Naloxone HCl 8-2 MG FILM, Place 1 tablet under the tongue in the morning and at bedtime. , Disp: , Rfl:  .  eplerenone (INSPRA) 50 MG tablet, Take 2 tablets (100 mg total) by mouth daily., Disp: 60 tablet, Rfl: 3 .  gabapentin (NEURONTIN) 800 MG tablet, Take 1 tablet (800 mg total) by mouth 3 (three) times daily., Disp: 90 tablet, Rfl: 1 .  metolazone (ZAROXOLYN) 2.5 MG tablet, Take 1 tablet (2.5 mg total) by mouth once a week. Every Friday, Disp: 90 tablet, Rfl: 0 .  metoprolol succinate (TOPROL XL) 25 MG 24 hr tablet, Take 1 tablet (25 mg total) by mouth 2 (two) times daily., Disp: 180 tablet, Rfl: 0 .  ondansetron (ZOFRAN ODT) 4 MG disintegrating tablet, Take 1 tablet (4 mg total) by mouth every 8 (eight) hours as needed for nausea or vomiting., Disp: 20 tablet, Rfl: 0 .  pantoprazole (PROTONIX) 40 MG tablet, Take 40 mg by mouth 2 (two) times daily., Disp: , Rfl:  .  polyethylene glycol (MIRALAX / GLYCOLAX) 17 g packet, Take 17 g by mouth 2 (two) times daily as needed for mild constipation., Disp: 14 each, Rfl: 0 .  potassium chloride SA (KLOR-CON M20) 20 MEQ tablet, Take 1 tablet (20 mEq total) by mouth every evening., Disp: 90 tablet, Rfl: 0 .  potassium chloride SA (KLOR-CON) 20 MEQ tablet, Take 2 tablets (40 mEq total) by mouth in the morning., Disp:  180 tablet, Rfl: 0 .  pravastatin (PRAVACHOL) 20 MG tablet, Take 1 tablet (20 mg total) by mouth daily., Disp: 30 tablet, Rfl: 1 .  senna (SENOKOT) 8.6 MG TABS tablet, Take 2 tablets (17.2 mg total) by mouth daily. (Patient not taking: Reported on 06/18/2020), Disp: 120 tablet, Rfl: 0 .  sertraline (ZOLOFT) 25 MG tablet, Take 25 mg by mouth daily., Disp: , Rfl:  .  sodium chloride (OCEAN) 0.65 % SOLN nasal spray, Place 1 spray into both nostrils as needed for congestion., Disp: , Rfl:  .  torsemide 60 MG TABS, Take 60 mg by mouth 2 (two) times daily., Disp: 180 tablet, Rfl: 0 .  VENTOLIN HFA 108 (90 Base) MCG/ACT inhaler, Inhale 1 puff into the lungs every 4 (four) hours as needed for shortness of breath. (Patient taking differently: Inhale 1-2 puffs into the lungs every 4 (four) hours as needed for shortness of breath.), Disp: 6.7 g, Rfl: 1 .  zolpidem (AMBIEN) 10 MG tablet, Take 10 mg by mouth at bedtime as needed for sleep. (Patient not taking: Reported on 06/18/2020), Disp: , Rfl:  Allergies  Allergen Reactions  . Penicillins     Broke out in convulsions as a child  Did it involve swelling of the face/tongue/throat, SOB, or low BP? N/A Did it involve sudden or severe rash/hives, skin peeling, or any reaction on the inside of your mouth or nose? N/A Did you need to seek medical attention at a hospital or doctor's office?N/A When did it last happen?N/A If all above answers are "NO", may proceed with cephalosporin use.     Social History   Socioeconomic History  . Marital status: Single    Spouse name: Not on file  . Number of children: Not on file  . Years of education: Not on file  . Highest education level: Not on file  Occupational History  . Not on file  Tobacco Use  . Smoking status: Never Smoker  . Smokeless tobacco: Never Used  Vaping Use  . Vaping Use: Never used  Substance and Sexual Activity  . Alcohol use: Not Currently  . Drug use: Not Currently    Types: Marijuana     Comment: Smoked for 30 years  . Sexual activity: Not on file  Other Topics Concern  . Not on file  Social History Narrative  . Not on file   Social Determinants of Health   Financial Resource Strain: Medium Risk  . Difficulty of Paying Living Expenses: Somewhat hard  Food Insecurity: No Food Insecurity  . Worried About Charity fundraiser in the Last Year: Never true  . Ran Out of Food in the Last Year: Never true  Transportation Needs: Unmet Transportation Needs  . Lack of Transportation (Medical): Yes  . Lack of Transportation (Non-Medical): No  Physical Activity: Not on file  Stress: Not on file  Social Connections: Not on file  Intimate Partner Violence: Not on file  Physical Exam Vitals reviewed.  Constitutional:      Appearance: Normal appearance. He is normal weight.  HENT:     Head: Normocephalic.     Nose: Nose normal.     Mouth/Throat:     Mouth: Mucous membranes are moist.     Pharynx: Oropharynx is clear.  Eyes:     Conjunctiva/sclera: Conjunctivae normal.     Pupils: Pupils are equal, round, and reactive to light.  Cardiovascular:     Rate and Rhythm: Normal rate and regular rhythm.     Pulses: Normal pulses.     Heart sounds: Normal heart sounds.  Pulmonary:     Effort: Pulmonary effort is normal.     Breath sounds: Normal breath sounds.  Abdominal:     General: Abdomen is flat.     Palpations: Abdomen is soft.  Musculoskeletal:        General: Swelling present. Normal range of motion.     Cervical back: Normal range of motion.     Right lower leg: Edema present.     Left lower leg: Edema present.     Comments: Lower legs swollen with mild pitting edema. Color improved from last week and blistering healing well with antibiotics.   Skin:    General: Skin is warm and dry.     Capillary Refill: Capillary refill takes less than 2 seconds.  Neurological:     General: No focal deficit present.     Mental Status: He is alert. Mental status is at  baseline.  Psychiatric:        Mood and Affect: Mood normal.      Arrived for home visit for Charles Gray who was alert and oriented seated in his recliner in the living room. Charles Gray reports feeling overall "pretty good" this week. Charles Gray has PCS services in the home now 4 times weekly as well as an Therapist, sports from [place] and PT from [place] that is coming out once weekly. Charles Gray was seen by his PCP last week and was placed on Ceflex for cellulitis in his lower legs. Lower legs are improving with mild edema and no redness noted as well as blistering healing well. Charles Gray denies shortness of breath, chest pain or dizziness today and reports have taken his medications. I obtained vitals and they are as noted in report. I reviewed medications and filled one pill box accordingly. We also reviewed upcoming appointments. Charles Gray set up his transportation for next weeks clinic visit where I will meet him for our weekly visit. Charles Gray requests transport for his appointment with Dr. Bridget Hartshorn in [place] on March 24th at 11:00- [facility] with same. Home visit complete I will see Charles Gray in one week at his clinic visit.    -Charles Gray requests me to inquire about hospital bed with his PCP office. I will call about same.    Refills: [facility]   -Meds picked up from [facility] (Metoprolol, Zoloft)  Future Appointments  Date Time Provider [place]  07/02/2020  9:30 AM [facility] PA/NP [facility] None     ACTION: Home visit completed Next visit planned for one week

## 2020-06-26 ENCOUNTER — Telehealth (HOSPITAL_COMMUNITY): Payer: Self-pay | Admitting: Licensed Clinical Social Worker

## 2020-06-26 NOTE — Telephone Encounter (Signed)
[facility] up with Dr Megan Salon with [facility].  CSW put in request with [facility] to take pt to appt next week.  Will continue to follow and assist as needed  Jorge Ny, [facility] Desk#: [phone] Cell#: [phone]

## 2020-07-01 ENCOUNTER — Telehealth (HOSPITAL_COMMUNITY): Payer: Self-pay

## 2020-07-01 NOTE — Telephone Encounter (Signed)
Spoke to [employer] who confirmed his transportation for tomorrow and agreed to meet at clinic for visit in the morning. Call complete.

## 2020-07-02 ENCOUNTER — Ambulatory Visit (HOSPITAL_COMMUNITY)
Admission: RE | Admit: 2020-07-02 | Discharge: 2020-07-02 | Disposition: A | Discharge status: Home / Self Care | Payer: Medicare HMO | Source: Ambulatory Visit | Attending: Adult Health | Admitting: Adult Health

## 2020-07-02 ENCOUNTER — Other Ambulatory Visit (HOSPITAL_COMMUNITY): Payer: Self-pay | Admitting: *Deleted

## 2020-07-02 ENCOUNTER — Other Ambulatory Visit: Payer: Self-pay

## 2020-07-02 ENCOUNTER — Encounter (HOSPITAL_COMMUNITY): Payer: Self-pay

## 2020-07-02 ENCOUNTER — Other Ambulatory Visit (HOSPITAL_COMMUNITY): Payer: Self-pay

## 2020-07-02 VITALS — BP 130/80 | HR 76 | Wt 370.4 lb

## 2020-07-02 DIAGNOSIS — G4733 Obstructive sleep apnea (adult) (pediatric): Secondary | ICD-10-CM

## 2020-07-02 DIAGNOSIS — Z79899 Other long term (current) drug therapy: Secondary | ICD-10-CM | POA: Diagnosis not present

## 2020-07-02 DIAGNOSIS — I480 Paroxysmal atrial fibrillation: Secondary | ICD-10-CM | POA: Diagnosis not present

## 2020-07-02 DIAGNOSIS — Z86711 Personal history of pulmonary embolism: Secondary | ICD-10-CM | POA: Diagnosis not present

## 2020-07-02 DIAGNOSIS — M7989 Other specified soft tissue disorders: Secondary | ICD-10-CM | POA: Insufficient documentation

## 2020-07-02 DIAGNOSIS — Z7901 Long term (current) use of anticoagulants: Secondary | ICD-10-CM | POA: Insufficient documentation

## 2020-07-02 DIAGNOSIS — I5032 Chronic diastolic (congestive) heart failure: Secondary | ICD-10-CM | POA: Diagnosis present

## 2020-07-02 DIAGNOSIS — Z8249 Family history of ischemic heart disease and other diseases of the circulatory system: Secondary | ICD-10-CM | POA: Diagnosis not present

## 2020-07-02 DIAGNOSIS — F419 Anxiety disorder, unspecified: Secondary | ICD-10-CM | POA: Diagnosis not present

## 2020-07-02 DIAGNOSIS — Z880 Allergy status to penicillin: Secondary | ICD-10-CM | POA: Diagnosis not present

## 2020-07-02 DIAGNOSIS — I50812 Chronic right heart failure: Secondary | ICD-10-CM

## 2020-07-02 DIAGNOSIS — Z6838 Body mass index (BMI) 38.0-38.9, adult: Secondary | ICD-10-CM | POA: Diagnosis not present

## 2020-07-02 DIAGNOSIS — I110 Hypertensive heart disease with heart failure: Secondary | ICD-10-CM | POA: Insufficient documentation

## 2020-07-02 DIAGNOSIS — I2510 Atherosclerotic heart disease of native coronary artery without angina pectoris: Secondary | ICD-10-CM | POA: Diagnosis not present

## 2020-07-02 DIAGNOSIS — Z86718 Personal history of other venous thrombosis and embolism: Secondary | ICD-10-CM | POA: Diagnosis not present

## 2020-07-02 LAB — BASIC METABOLIC PANEL
Anion gap: 9 (ref 5–15)
BUN: 15 mg/dL (ref 8–23)
CO2: 34 mmol/L — ABNORMAL HIGH (ref 22–32)
Calcium: 9.2 mg/dL (ref 8.9–10.3)
Chloride: 92 mmol/L — ABNORMAL LOW (ref 98–111)
Creatinine, Ser: 0.99 mg/dL (ref 0.61–1.24)
GFR, Estimated: >60 mL/min (ref >60)
Glucose, Bld: 119 mg/dL — ABNORMAL HIGH (ref 70–99)
Potassium: 3.8 mmol/L (ref 3.5–5.1)
Sodium: 135 mmol/L (ref 135–145)

## 2020-07-02 MED ORDER — DOCUSATE SODIUM 100 MG PO CAPS: 100.0000 mg | ORAL_CAPSULE | Freq: Every day | ORAL | 2 refills | Status: DC | PRN

## 2020-07-02 MED ORDER — POTASSIUM CHLORIDE CRYS ER 20 MEQ PO TBCR: EXTENDED_RELEASE_TABLET | ORAL | 3 refills | Status: DC

## 2020-07-02 MED ORDER — TORSEMIDE 20 MG PO TABS: ORAL_TABLET | ORAL | 11 refills | Status: DC

## 2020-07-02 NOTE — Progress Notes (Signed)
Paramedicine Encounter    Patient ID: Charles Gray, male    DOB: 05-26-1955, 65 y.o.   MRN: [id number]  Charles Gray in clinic today who was alert and oriented reporting he was feeling okay today but has been having a hard time tolerating Metolazone. [name] Clegg PA assessed Charles Gray and discussed medication compliance and the importance of being able to stay out of the hospital. Demetrios agreed and reports understanding. [name] increased Torsemide to 72m AM and 635mEVE. And was told to continue metolazone, however Charles Gray reports he does not want to take the metolazone. [name] expressed the importance of him needing to comply with medications prescribed. I filled pill box accordingly minus Metolazone as patient refuses to take same. I instructed him to be sure he is weighing daily and reporting to me any weight gain or symptoms. He agreed. Clinic visit complete. Charles Gray follow up in clinic on April 19th at 1030. I will see Charles Gray in one week.   Refills:  NONE  ACTION: Home visit completed Next visit planned for one week

## 2020-07-02 NOTE — Progress Notes (Signed)
ADVANCED HF CLINIC CONSULT NOTE  Primary Care: Enid Skeens., MD Primary Cardiologist: Dr. Berniece Salines, DO  HF Cardiology: Dr. [name]  HPI: 65 y.o. 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    He has had multiple hospitalizations in 2021 for CHF exacerbation, requiring IV Lasix and change in PO regimen from lasix to torsemide. Echo 4/21 showed normal LVEF 55-60% w/ G1DD. RV not well visualized, but felt to have component of RV failure. [place] 4/21 showed normal PA pressure and normal cardiac output. PFTs also completed and c/w mild obstructive airway disease.   In 7/21, he had a coronary CTA.  This showed no coronary calcium but there was a concern for soft plaque in the distal LCx and distal RCA with hemodynamically significant stenosis.  He was managed medically. He has not had any chest pain, but was started on ranolazine after his CTA.  He feels like it makes his "head fuzzy."    Admitted with A/C diastolic heart failure. Diuresed with IV lasix and transitioned to bumex 3 mg twice a day. Discharge weight 360 pounds. Discharged 02/09/2020.   He was admitted again in 12/21 with CHF.  Echo in 12/21 showed EF 27-03%, grade 2 diastolic dysfunction, RV poorly visualized.   OV on 12/21 still having significant exertional dyspnea, short of breath just walking around the house.  + Orthopnea, has to sleep on the sofa.  Sleeps poorly, cannot tolerate CPAP.  Cardiomems was discussed but patient reluctant, Jardiance added, metolazone increased to 2 x/week, metoprolol stopped, and Toprol XL 25 mg daily started.  Admitted 5/00/93 with A/C diastolic heart failure. Diuresed with IV lasix + metolazone. Transitioned to torsemide 60 mg twice a day and metolazone twice a day. D/C weight 364 pounds.   Today he returns for post HF follow up.Overall feeling fine but says he has a hard time on metolazone days.  Remains SOB with exertion but this is his baseline. Denies PND/Orthopnea. Appetite ok. Drinks lots of fluids. No fever or chills. Weight at home 361-371pounds. Says he doesn't feel good after he takes metolazone. Not very active at home. Taking all medications. Followed by HF Paramedicine.    Labs (6/21): K 4.3, creatinine 1.03 Labs (10/21): LDL 73 Labs (12/21): K 3.8, creatinine 0.98 Labs 06/13/20: K 3.1 Creatinine 0.9   PMH: 1. HTN 2. OSA 3. Hyperlipidemia 4. Low back pain 5. PE/DVT in 2/21.  - V/Q scan in 6/21 was not suggestive of chronic PE.  6. Nonalcoholic steatohepatitis.  7. GI bleed in 10/20.  8. Anxiety 9. CAD: Coronary CTA in 7/21 with calcium score 0 but concern for soft plaque distal CFX and RCA.  By [facility], there was flow-limiting stenosis in distal CFX and distal RCA.  10. Chronic diastolic CHF: Echo in 8/18 with EF 55-60%, RV poorly visualized.  - RHC (4/21): mean RA 8, PA 20/8, mean PCWP 11, CI 2.8 - Echo (12/21):EF 29-93%, grade 2 diastolic dysfunction, RV poorly visualized.   11. PFTs (4/21): Mild obstruction.  12. ABIs (10/21): Normal.  Review of Systems: All systems reviewed and negative except as per HPI.    Current Outpatient Medications  Medication Sig Dispense Refill  . acetaminophen (TYLENOL) 500 MG tablet Take 1,000 mg by mouth every 6 (six) hours as needed for moderate pain or headache.    Marland Kitchen apixaban (ELIQUIS) 5 MG TABS tablet Take 1 tablet (5 mg total) by mouth 2 (two)  times daily. 60 tablet 0  . Buprenorphine HCl-Naloxone HCl 8-2 MG FILM Place 1 tablet under the tongue in the morning and at bedtime.     Marland Kitchen eplerenone (INSPRA) 50 MG tablet Take 2 tablets (100 mg total) by mouth daily. 60 tablet 3  . gabapentin (NEURONTIN) 800 MG tablet Take 1 tablet (800 mg total) by mouth 3 (three) times daily. 90 tablet 1  . metolazone (ZAROXOLYN) 2.5 MG tablet Take 1 tablet (2.5 mg total) by mouth once a week. Every Friday 90 tablet 0  . metoprolol succinate (TOPROL XL)  25 MG 24 hr tablet Take 1 tablet (25 mg total) by mouth 2 (two) times daily. 180 tablet 3  . ondansetron (ZOFRAN ODT) 4 MG disintegrating tablet Take 1 tablet (4 mg total) by mouth every 8 (eight) hours as needed for nausea or vomiting. 20 tablet 0  . pantoprazole (PROTONIX) 40 MG tablet Take 40 mg by mouth 2 (two) times daily.    . polyethylene glycol (MIRALAX / GLYCOLAX) 17 g packet Take 17 g by mouth 2 (two) times daily as needed for mild constipation. 14 each 0  . potassium chloride SA (KLOR-CON) 20 MEQ tablet Take 20 mEq by mouth 2 (two) times daily. Take 40 meq in AM and 20 meq in PM    . pravastatin (PRAVACHOL) 20 MG tablet Take 1 tablet (20 mg total) by mouth daily. 30 tablet 1  . senna (SENOKOT) 8.6 MG TABS tablet Take 2 tablets (17.2 mg total) by mouth daily. 120 tablet 0  . sertraline (ZOLOFT) 25 MG tablet Take 1 tablet (25 mg total) by mouth daily. 30 tablet 0  . sodium chloride (OCEAN) 0.65 % SOLN nasal spray Place 1 spray into both nostrils as needed for congestion.    . torsemide 60 MG TABS Take 60 mg by mouth 2 (two) times daily. 180 tablet 0  . VENTOLIN HFA 108 (90 Base) MCG/ACT inhaler Inhale 1 puff into the lungs every 4 (four) hours as needed for shortness of breath. (Patient taking differently: Inhale 1-2 puffs into the lungs every 4 (four) hours as needed for shortness of breath.) 6.7 g 1  . zolpidem (AMBIEN) 10 MG tablet Take 10 mg by mouth at bedtime as needed for sleep.     No current facility-administered medications for this encounter.   Allergies  Allergen Reactions  . Penicillins     Broke out in convulsions as a child  Did it involve swelling of the face/tongue/throat, SOB, or low BP? N/A Did it involve sudden or severe rash/hives, skin peeling, or any reaction on the inside of your mouth or nose? N/A Did you need to seek medical attention at a hospital or doctor's office?N/A When did it last happen?N/A If all above answers are "NO", may proceed with  cephalosporin use.   Social History   Socioeconomic History  . Marital status: Single    Spouse name: Not on file  . Number of children: Not on file  . Years of education: Not on file  . Highest education level: Not on file  Occupational History  . Not on file  Tobacco Use  . Smoking status: Never Smoker  . Smokeless tobacco: Never Used  Vaping Use  . Vaping Use: Never used  Substance and Sexual Activity  . Alcohol use: Not Currently  . Drug use: Not Currently    Types: Marijuana    Comment: Smoked for 30 years  . Sexual activity: Not on file  Other Topics Concern  .  Not on file  Social History Narrative  . Not on file   Social Determinants of Health   Financial Resource Strain: Medium Risk  . Difficulty of Paying Living Expenses: Somewhat hard  Food Insecurity: No Food Insecurity  . Worried About Charity fundraiser in the Last Year: Never true  . Ran Out of Food in the Last Year: Never true  Transportation Needs: Unmet Transportation Needs  . Lack of Transportation (Medical): Yes  . Lack of Transportation (Non-Medical): No  Physical Activity: Not on file  Stress: Not on file  Social Connections: Not on file  Intimate Partner Violence: Not on file    Family History  Problem Relation Age of Onset  . Hyperlipidemia Mother   . Hypertension Mother   . Stroke Mother   . CAD Maternal Grandmother   . CAD Maternal Grandfather     Vitals:   07/02/20 0943  BP: 130/80  Pulse: 76  SpO2: 96%  Weight: (!) 168 kg     Wt Readings from Last 3 Encounters:  07/02/20 (!) 168 kg  06/25/20 (!) 166.9 kg  06/18/20 (!) 164.7 kg    PHYSICAL EXAM: General:  Arrived in a wheel chair.  No resp difficulty HEENT: normal Neck: supple. JVP 7-8 . Carotids 2+ bilat; no bruits. No lymphadenopathy or thryomegaly appreciated. Cor: PMI nondisplaced. Regular rate & rhythm. No rubs, gallops or murmurs. Lungs: clear Abdomen: soft, nontender, nondistended. No hepatosplenomegaly. No  bruits or masses. Good bowel sounds. Extremities: no cyanosis, clubbing, rash, R and LLE trace-1+ edema Neuro: alert & orientedx3, cranial nerves grossly intact. moves all 4 extremities w/o difficulty. Affect pleasant   ASSESSMENT & PLAN: 1. Chronic diastolic CHF: Echo in 93/79 with EF 02-40%, grade 2 diastolic dysfunction, RV poorly visualized.  [place] in 4/21 actually showed optimized filling pressures and no pulmonary hypertension.  Patient has had multiple admissions this year with CHF.   - NYHA class III symptoms.  - Ideally, would have Cardiomems but he declines.    - He did not tolerate jardiance due to GI upset.  - Volume status trending up. Increase torsemide 80 mg in am and continue 60 mg in pm. - Continue eplerenone 100 mg daily + potassium 40/20 meq. .  - Continue metolazone weekly for now.  - Discussed limiting fluid intake to < 2 liters per day.  - Check BMET  2. H/o PE/DVT: Suspect due to sedentary lifestyle.  Occurred in 2/21.  V/Q scan in 6/21 did not show evidence for chronic PE.   - Continue apixaban.  - No bleeding issues.  3. OSA: Cannot tolerate CPAP.  4. Anxiety/panic:  - Continue sertraline 25 mg daily.  5. CAD: Coronary CTA showed suspected soft plaque distal RCA and distal LCX that is hemodynamically significant by FFR, but coronary calcium score surprisingly is 0. No chest pain.   - Continue statin, LDL 73 in 10/21.  6. Hypertension: Stable today.  - Continue Toprol XL 25 mg daily (felt metoprolol 50 mg bid made him lightheaded). 7. Atrial fibrillation: Paroxysmal. - Regular on exam.  -  Continue apixaban. No bleeding issues.  8. Right leg swelling: resolving, LE dopplers (1/22) negative for DVT. Completed Keflex. 9. Morbid Obesity: Body mass index is 38.73 kg/m.  Follow up in 2-3 weeks. Discussed medication changes with him and HF Paramedicine. He is at high risk for readmit.   Carlise Stofer NP-C  07/02/2020

## 2020-07-02 NOTE — Patient Instructions (Signed)
Labs done today. We will contact you only if your labs are abnormal.  INCREASE Torsemide to 26m (4 tablets) by mouth every morning and 667m(3 tablets) by mouth every evening.  TAKE Colace 10046m1 tablet) by mouth as needed for constipation.   No other medication changes were made. Please continue all current medications as prescribed.  Your physician recommends that you schedule a follow-up appointment in: 3-4 weeks with [facility]  If you have any questions or concerns before your next appointment please send us [place]essage through [facility] [facility] call our office at [phone]  TO LEAVE A MESSAGE FOR THE NURSE SELECT OPTION 2, PLEASE LEAVE A MESSAGE INCLUDING: . YOUR NAME . DATE OF BIRTH . CALL BACK NUMBER . REASON FOR CALL**this is important as we prioritize the call backs  YOU WILL RECEIVE A CALL BACK THE SAME DAY AS LONG AS YOU CALL BEFORE 4:00 PM   Do the following things EVERYDAY: 1) Weigh yourself in the morning before breakfast. Write it down and keep it in a log. 2) Take your medicines as prescribed 3) Eat low salt foods--Limit salt (sodium) to 2000 mg per day.  4) Stay as active as you can everyday 5) Limit all fluids for the day to less than 2 liters   At [facility] and your health needs are our priority. As part of our continuing mission to provide you with exceptional heart care, we have created designated Provider Care Teams. These Care Teams include your primary Cardiologist (physician) and Advanced Practice Providers (APPs- Physician Assistants and Nurse Practitioners) who all work together to provide you with the care you need, when you need it.   You may see any of the following providers on your designated Care Team at your next follow up: . DMarland Kitchen DanGlori BickersDr DalLoralie ChampagneAmyDarrick GrinderP . BriLyda JesterA . LauAudry RilesharmD   Please be sure to bring in all your medications bottles to every appointment.

## 2020-07-06 ENCOUNTER — Telehealth: Payer: Self-pay | Admitting: Cardiology

## 2020-07-06 NOTE — Telephone Encounter (Signed)
Patient called in to the on-call service reporting he is not feeling well this morning. Woke up with nausea, GI upset, and profuse diaphoresis. He was recently discharged and followed up in the AHF clinic. Was noted to be volume up at that visit with torsemide increased. Says he has felt more short of breath as well. On the phone he sounds uncomfortable, weak. Is concerned with presenting back to the ED but I advised it is difficult to determine what it happening over the phone. Given his complex hx , he should be seen in the ED for further evaluation. He was agreeable after long conversation.

## 2020-07-09 ENCOUNTER — Other Ambulatory Visit (HOSPITAL_COMMUNITY): Payer: Self-pay

## 2020-07-09 NOTE — Progress Notes (Signed)
Paramedicine Encounter    Patient ID: Charles Gray, male    DOB: [birth year], 64 y.o.   MRN: [id number]   Patient Care Team: Enid Skeens., MD as PCP - General (Family Medicine) Berniece Salines, DO as PCP - Cardiology (Cardiology)  Patient Active Problem List   Diagnosis Date Noted  . GERD without esophagitis 06/10/2020  . Atrial fibrillation with rapid ventricular response ([place]) 03/24/2020  . Acute on chronic right heart failure ([place]) 01/27/2020  . Acute respiratory failure with hypoxia ([place])   . Fatty liver disease, nonalcoholic   . Shock ([place])   . Personal history of DVT (deep vein thrombosis) 12/26/2019  . Chronic anticoagulation 12/26/2019  . Palpitations 12/26/2019  . AF (paroxysmal atrial fibrillation) ([place]) 12/23/2019  . Heart failure, systolic, acute ([place]) 30/12/2328  . Acute on chronic congestive heart failure ([place])   . Acute on chronic diastolic CHF (congestive heart failure) ([place]) 08/23/2019  . Redness and swelling of lower leg 08/23/2019  . Chest pain 08/23/2019  . Lactic acidosis 08/23/2019  . History of pulmonary embolism 08/12/2019  . Atrial tachycardia ([place]) 08/12/2019  . Occasional tremors 08/12/2019  . OSA (obstructive sleep apnea) 08/12/2019  . Acute on chronic right-sided heart failure ([place]) 08/06/2019  . Pulmonary embolism ([place]) 06/08/2019  . Normocytic anemia 06/08/2019  . Pulmonary emboli ([place]) 06/08/2019  . Chronic heart failure with preserved ejection fraction ([place]) 05/30/2019  . Class 2 severe obesity due to excess calories with serious comorbidity and body mass index (BMI) of 39.0 to 39.9 in adult ([place]) 05/30/2019  . Morbid obesity with BMI of 40.0-44.9, adult ([place]) 05/30/2019  . Hyponatremia   . Drug induced constipation   . Peripheral edema   . Hypotension due to drugs   . Hypoalbuminemia due to protein-calorie malnutrition ([place])   . Hypokalemia   . Thrombocytopenia ([place])   . Anemia of chronic disease   . Cirrhosis of liver without ascites  ([place])   . Chronic pain syndrome   . Debility 03/13/2019  . Portal hypertensive gastropathy ([place]) 03/06/2019  . Dyspnea   . GIB (gastrointestinal bleeding) 03/04/2019  . Mixed hyperlipidemia 01/24/2019  . Insomnia 01/24/2019  . Hyperlipidemia 01/24/2019  . Essential hypertension 01/24/2019  . Lumbar disc herniation 01/24/2019    Current Outpatient Medications:  .  acetaminophen (TYLENOL) 500 MG tablet, Take 1,000 mg by mouth every 6 (six) hours as needed for moderate pain or headache., Disp: , Rfl:  .  apixaban (ELIQUIS) 5 MG TABS tablet, Take 1 tablet (5 mg total) by mouth 2 (two) times daily., Disp: 60 tablet, Rfl: 0 .  Buprenorphine HCl-Naloxone HCl 8-2 MG FILM, Place 1 tablet under the tongue in the morning and at bedtime. , Disp: , Rfl:  .  docusate sodium (COLACE) 100 MG capsule, Take 1 capsule (100 mg total) by mouth daily as needed for mild constipation or moderate constipation., Disp: 30 capsule, Rfl: 2 .  eplerenone (INSPRA) 50 MG tablet, Take 2 tablets (100 mg total) by mouth daily., Disp: 60 tablet, Rfl: 3 .  gabapentin (NEURONTIN) 800 MG tablet, Take 1 tablet (800 mg total) by mouth 3 (three) times daily., Disp: 90 tablet, Rfl: 1 .  metolazone (ZAROXOLYN) 2.5 MG tablet, Take 1 tablet (2.5 mg total) by mouth once a week. Every Friday, Disp: 90 tablet, Rfl: 0 .  metoprolol succinate (TOPROL XL) 25 MG 24 hr tablet, Take 1 tablet (25 mg total) by mouth 2 (two) times daily., Disp: 180 tablet, Rfl: 3 .  ondansetron (  ZOFRAN ODT) 4 MG disintegrating tablet, Take 1 tablet (4 mg total) by mouth every 8 (eight) hours as needed for nausea or vomiting., Disp: 20 tablet, Rfl: 0 .  pantoprazole (PROTONIX) 40 MG tablet, Take 40 mg by mouth 2 (two) times daily., Disp: , Rfl:  .  polyethylene glycol (MIRALAX / GLYCOLAX) 17 g packet, Take 17 g by mouth 2 (two) times daily as needed for mild constipation., Disp: 14 each, Rfl: 0 .  potassium chloride SA (KLOR-CON) 20 MEQ tablet, Take 40 meq in AM and  20 meq in PM, Disp: 45 tablet, Rfl: 3 .  pravastatin (PRAVACHOL) 20 MG tablet, Take 1 tablet (20 mg total) by mouth daily., Disp: 30 tablet, Rfl: 1 .  senna (SENOKOT) 8.6 MG TABS tablet, Take 2 tablets (17.2 mg total) by mouth daily., Disp: 120 tablet, Rfl: 0 .  sertraline (ZOLOFT) 25 MG tablet, Take 1 tablet (25 mg total) by mouth daily., Disp: 30 tablet, Rfl: 0 .  sodium chloride (OCEAN) 0.65 % SOLN nasal spray, Place 1 spray into both nostrils as needed for congestion., Disp: , Rfl:  .  torsemide (DEMADEX) 20 MG tablet, Take 4 tablets (80 mg total) by mouth every morning AND 3 tablets (60 mg total) every evening., Disp: 210 tablet, Rfl: 11 .  VENTOLIN HFA 108 (90 Base) MCG/ACT inhaler, Inhale 1 puff into the lungs every 4 (four) hours as needed for shortness of breath. (Patient taking differently: Inhale 1-2 puffs into the lungs every 4 (four) hours as needed for shortness of breath.), Disp: 6.7 g, Rfl: 1 .  zolpidem (AMBIEN) 10 MG tablet, Take 10 mg by mouth at bedtime as needed for sleep., Disp: , Rfl:  Allergies  Allergen Reactions  . Penicillins     Broke out in convulsions as a child  Did it involve swelling of the face/tongue/throat, SOB, or low BP? N/A Did it involve sudden or severe rash/hives, skin peeling, or any reaction on the inside of your mouth or nose? N/A Did you need to seek medical attention at a hospital or doctor's office?N/A When did it last happen?N/A If all above answers are "NO", may proceed with cephalosporin use.     Social History   Socioeconomic History  . Marital status: Single    Spouse name: Not on file  . Number of children: Not on file  . Years of education: Not on file  . Highest education level: Not on file  Occupational History  . Not on file  Tobacco Use  . Smoking status: Never Smoker  . Smokeless tobacco: Never Used  Vaping Use  . Vaping Use: Never used  Substance and Sexual Activity  . Alcohol use: Not Currently  . Drug use: Not  Currently    Types: Marijuana    Comment: Smoked for 30 years  . Sexual activity: Not on file  Other Topics Concern  . Not on file  Social History Narrative  . Not on file   Social Determinants of Health   Financial Resource Strain: Medium Risk  . Difficulty of Paying Living Expenses: Somewhat hard  Food Insecurity: No Food Insecurity  . Worried About Charity fundraiser in the Last Year: Never true  . Ran Out of Food in the Last Year: Never true  Transportation Needs: Unmet Transportation Needs  . Lack of Transportation (Medical): Yes  . Lack of Transportation (Non-Medical): No  Physical Activity: Not on file  Stress: Not on file  Social Connections: Not on file  Intimate Partner Violence: Not on file    Physical Exam Vitals reviewed.  Constitutional:      Appearance: Normal appearance. He is normal weight.  HENT:     Head: Normocephalic.     Nose: Nose normal.     Mouth/Throat:     Mouth: Mucous membranes are moist.     Pharynx: Oropharynx is clear.  Eyes:     Conjunctiva/sclera: Conjunctivae normal.     Pupils: Pupils are equal, round, and reactive to light.  Cardiovascular:     Rate and Rhythm: Normal rate and regular rhythm.     Pulses: Normal pulses.     Heart sounds: Normal heart sounds.  Pulmonary:     Effort: Pulmonary effort is normal.     Breath sounds: Normal breath sounds.  Abdominal:     General: Abdomen is flat.     Palpations: Abdomen is soft.     Comments: Generalized abdominal pain, decreased stool output since last week.  Musculoskeletal:        General: Swelling present. Normal range of motion.     Cervical back: Normal range of motion.     Right lower leg: Edema present.     Left lower leg: Edema present.  Skin:    General: Skin is warm and dry.     Capillary Refill: Capillary refill takes less than 2 seconds.  Neurological:     General: No focal deficit present.     Mental Status: He is alert. Mental status is at baseline.   Psychiatric:        Mood and Affect: Mood normal.     Arrived for home visit for Linell who was alert and oriented reporting to be feeling okay but having some abdominal pain. Ayiden reports he has been having some on going constipation but has not started taking the over the counter stool softener yet. I reviewed medications and filled pill box accordingly adding the stool softener on Wednesday and Saturday as requested by the patient. I reviewed vitals and they are as noted. Captain was reminded of upcoming appointments. Aaryav advised me he has an appointment next week with his suboxone doctor, Dr. Bridget Hartshorn at 10:00 and is needing transport for same. I will have [place] assist with this.   Christoffer is receiving [facility] from [facility] and the nurse was present during our visit today. She told Jeral he will be discharged from their services on 08/06/20.   I will see Adrean in one week.   Refills:  Gabapentin Ambien     Future Appointments  Date Time Provider [place]  07/30/2020 10:30 AM [facility] PA/NP [facility] None     ACTION: Home visit completed Next visit planned for one week

## 2020-07-12 ENCOUNTER — Telehealth (HOSPITAL_COMMUNITY): Payer: Self-pay | Admitting: Licensed Clinical Social Worker

## 2020-07-12 NOTE — Telephone Encounter (Signed)
CSW called pt to check in regarding transportation to non-[facility] appt next week.  Pt reports he was able to get ride through [facility] benefit to get to that appt so no need for CSW involvement.  Pt then reported he is feeling very poorly today.  Is SOB, dizzy, has dry mouth, and reports leg swelling.  States he is urinating normally and has been taking his meds.  States his weight is actually down- was 370-371 yesterday and was 367.  Discussed with triage and Dr. Marigene Ehlers who report pt should just continue to take medications and track weight but that at this time no reason to come to ED as his weight is trending down so the medications appear to be working so they would not make any changes for him unless his condition escalated.  CSW updated pt of above.  Will continue to follow and assist as needed  Jorge Ny, [facility] Desk#: [phone] Cell#: [phone]

## 2020-07-16 ENCOUNTER — Telehealth (HOSPITAL_COMMUNITY): Payer: Self-pay | Admitting: *Deleted

## 2020-07-16 ENCOUNTER — Other Ambulatory Visit (HOSPITAL_COMMUNITY): Payer: Self-pay

## 2020-07-16 NOTE — Progress Notes (Signed)
Paramedicine Encounter    Patient ID: Charles Gray, male    DOB: [birth year], 65 y.o.   MRN: [id number]   Patient Care Team: Enid Skeens., MD as PCP - General (Family Medicine) Berniece Salines, DO as PCP - Cardiology (Cardiology)  Patient Active Problem List   Diagnosis Date Noted  . GERD without esophagitis 06/10/2020  . Atrial fibrillation with rapid ventricular response ([place]) 03/24/2020  . Acute on chronic right heart failure ([place]) 01/27/2020  . Acute respiratory failure with hypoxia ([place])   . Fatty liver disease, nonalcoholic   . Shock ([place])   . Personal history of DVT (deep vein thrombosis) 12/26/2019  . Chronic anticoagulation 12/26/2019  . Palpitations 12/26/2019  . AF (paroxysmal atrial fibrillation) ([place]) 12/23/2019  . Heart failure, systolic, acute ([place]) 40/97/3532  . Acute on chronic congestive heart failure ([place])   . Acute on chronic diastolic CHF (congestive heart failure) ([place]) 08/23/2019  . Redness and swelling of lower leg 08/23/2019  . Chest pain 08/23/2019  . Lactic acidosis 08/23/2019  . History of pulmonary embolism 08/12/2019  . Atrial tachycardia ([place]) 08/12/2019  . Occasional tremors 08/12/2019  . OSA (obstructive sleep apnea) 08/12/2019  . Acute on chronic right-sided heart failure ([place]) 08/06/2019  . Pulmonary embolism ([place]) 06/08/2019  . Normocytic anemia 06/08/2019  . Pulmonary emboli ([place]) 06/08/2019  . Chronic heart failure with preserved ejection fraction ([place]) 05/30/2019  . Class 2 severe obesity due to excess calories with serious comorbidity and body mass index (BMI) of 39.0 to 39.9 in adult ([place]) 05/30/2019  . Morbid obesity with BMI of 40.0-44.9, adult ([place]) 05/30/2019  . Hyponatremia   . Drug induced constipation   . Peripheral edema   . Hypotension due to drugs   . Hypoalbuminemia due to protein-calorie malnutrition ([place])   . Hypokalemia   . Thrombocytopenia ([place])   . Anemia of chronic disease   . Cirrhosis of liver without ascites  ([place])   . Chronic pain syndrome   . Debility 03/13/2019  . Portal hypertensive gastropathy ([place]) 03/06/2019  . Dyspnea   . GIB (gastrointestinal bleeding) 03/04/2019  . Mixed hyperlipidemia 01/24/2019  . Insomnia 01/24/2019  . Hyperlipidemia 01/24/2019  . Essential hypertension 01/24/2019  . Lumbar disc herniation 01/24/2019    Current Outpatient Medications:  .  acetaminophen (TYLENOL) 500 MG tablet, Take 1,000 mg by mouth every 6 (six) hours as needed for moderate pain or headache., Disp: , Rfl:  .  apixaban (ELIQUIS) 5 MG TABS tablet, Take 1 tablet (5 mg total) by mouth 2 (two) times daily., Disp: 60 tablet, Rfl: 0 .  Buprenorphine HCl-Naloxone HCl 8-2 MG FILM, Place 1 tablet under the tongue in the morning and at bedtime. , Disp: , Rfl:  .  docusate sodium (COLACE) 100 MG capsule, Take 1 capsule (100 mg total) by mouth daily as needed for mild constipation or moderate constipation., Disp: 30 capsule, Rfl: 2 .  doxycycline (VIBRAMYCIN) 50 MG capsule, TAKE 2 CAPSULES BY MOUTH TWICE A DAY FOR 14 DAYS. (Patient not taking: Reported on 04/23/2020), Disp: 56 capsule, Rfl: 0 .  eplerenone (INSPRA) 50 MG tablet, Take 2 tablets (100 mg total) by mouth daily., Disp: 60 tablet, Rfl: 3 .  gabapentin (NEURONTIN) 800 MG tablet, Take 1 tablet (800 mg total) by mouth 3 (three) times daily., Disp: 90 tablet, Rfl: 1 .  metolazone (ZAROXOLYN) 2.5 MG tablet, Take 1 tablet (2.5 mg total) by mouth once a week. Every Friday (Patient not taking: Reported on 07/09/2020), Disp:  90 tablet, Rfl: 0 .  metoprolol succinate (TOPROL XL) 25 MG 24 hr tablet, Take 1 tablet (25 mg total) by mouth 2 (two) times daily., Disp: 180 tablet, Rfl: 3 .  ondansetron (ZOFRAN ODT) 4 MG disintegrating tablet, Take 1 tablet (4 mg total) by mouth every 8 (eight) hours as needed for nausea or vomiting., Disp: 20 tablet, Rfl: 0 .  pantoprazole (PROTONIX) 40 MG tablet, Take 40 mg by mouth 2 (two) times daily., Disp: , Rfl:  .  potassium  chloride SA (KLOR-CON) 20 MEQ tablet, Take 40 meq in AM and 20 meq in PM, Disp: 45 tablet, Rfl: 3 .  pravastatin (PRAVACHOL) 20 MG tablet, Take 1 tablet (20 mg total) by mouth daily., Disp: 30 tablet, Rfl: 1 .  senna (SENOKOT) 8.6 MG TABS tablet, Take 2 tablets (17.2 mg total) by mouth daily. (Patient not taking: Reported on 07/09/2020), Disp: 120 tablet, Rfl: 0 .  sertraline (ZOLOFT) 25 MG tablet, Take 1 tablet (25 mg total) by mouth daily., Disp: 30 tablet, Rfl: 0 .  sodium chloride (OCEAN) 0.65 % SOLN nasal spray, Place 1 spray into both nostrils as needed for congestion., Disp: , Rfl:  .  torsemide (DEMADEX) 20 MG tablet, Take 4 tablets (80 mg total) by mouth every morning AND 3 tablets (60 mg total) every evening., Disp: 210 tablet, Rfl: 11 .  VENTOLIN HFA 108 (90 Base) MCG/ACT inhaler, Inhale 1 puff into the lungs every 4 (four) hours as needed for shortness of breath. (Patient taking differently: Inhale 1-2 puffs into the lungs every 4 (four) hours as needed for shortness of breath.), Disp: 6.7 g, Rfl: 1 .  zolpidem (AMBIEN) 10 MG tablet, Take 10 mg by mouth at bedtime as needed for sleep., Disp: , Rfl:  Allergies  Allergen Reactions  . Penicillins     Broke out in convulsions as a child  Did it involve swelling of the face/tongue/throat, SOB, or low BP? N/A Did it involve sudden or severe rash/hives, skin peeling, or any reaction on the inside of your mouth or nose? N/A Did you need to seek medical attention at a hospital or doctor's office?N/A When did it last happen?N/A If all above answers are "NO", may proceed with cephalosporin use.     Social History   Socioeconomic History  . Marital status: Single    Spouse name: Not on file  . Number of children: Not on file  . Years of education: Not on file  . Highest education level: Not on file  Occupational History  . Not on file  Tobacco Use  . Smoking status: Never Smoker  . Smokeless tobacco: Never Used  Vaping Use  . Vaping  Use: Never used  Substance and Sexual Activity  . Alcohol use: Not Currently  . Drug use: Not Currently    Types: Marijuana    Comment: Smoked for 30 years  . Sexual activity: Not on file  Other Topics Concern  . Not on file  Social History Narrative  . Not on file   Social Determinants of Health   Financial Resource Strain: Medium Risk  . Difficulty of Paying Living Expenses: Somewhat hard  Food Insecurity: No Food Insecurity  . Worried About Charity fundraiser in the Last Year: Never true  . Ran Out of Food in the Last Year: Never true  Transportation Needs: Unmet Transportation Needs  . Lack of Transportation (Medical): Yes  . Lack of Transportation (Non-Medical): No  Physical Activity: Not on file  Stress: Not on file  Social Connections: Not on file  Intimate Partner Violence: Not on file    Physical Exam Vitals reviewed.  Constitutional:      Appearance: Normal appearance.  HENT:     Head: Normocephalic.     Nose: Nose normal.     Mouth/Throat:     Mouth: Mucous membranes are moist.     Pharynx: Oropharynx is clear.  Eyes:     Conjunctiva/sclera: Conjunctivae normal.     Pupils: Pupils are equal, round, and reactive to light.  Cardiovascular:     Rate and Rhythm: Normal rate and regular rhythm.     Pulses: Normal pulses.     Heart sounds: Normal heart sounds.  Pulmonary:     Effort: Pulmonary effort is normal.     Breath sounds: Normal breath sounds.  Abdominal:     Palpations: Abdomen is soft.  Musculoskeletal:        General: Swelling present. Normal range of motion.     Cervical back: Normal range of motion.     Right lower leg: Edema present.     Left lower leg: Edema present.  Skin:    General: Skin is warm and dry.     Capillary Refill: Capillary refill takes less than 2 seconds.  Neurological:     General: No focal deficit present.     Mental Status: He is alert. Mental status is at baseline.  Psychiatric:        Mood and Affect: Mood  normal.     Arrived for home visit for Aadarsh who was alert and oriented seated in his recliner reporting he was not feeling well today. He stated his stomach was hurting, he feels short of breath and has a headache. Andric was compliant with his medications over the last week except his Metolazone which he refuses to take. Makai's weight is up 7lbs this week. Legs swollen and edema noted. Lung sounds clear. I obtained vitals and they are as noted. I called HF clinic triage and Delana Meyer reports she spoke to Dr. [name] who stated patient needs to be taking Metolazone weekly to ensure he does not become volume overloaded. Mikie refuses to take Metolazone and reports he gets so sick after taking it and does not want to take it. Jaykub says he feels better after getting IV lasix in the hospital than taking the metolazone. HF clinic notified of same and continued to encourage Mr. Seidner to take his medications as prescribed. Medications were reviewed and verified and confirmed. Pill box filled accordingly (minus Metolazone) Mikhail reports feeling sick but does not want to go to the hospital right now but says he will call me or call EMS if needed. Khyle agreed with plan and understands to call if needed. I plan to see Zimere in one week.   Trust encouraged to follow up with PCP for swollen, red, blistered legs as well as he has a known history of frequent cellulitis.   Refills: Potassium  Pantoprazole     Future Appointments  Date Time Provider [place]  07/30/2020 10:30 AM [facility] PA/NP [facility] None     ACTION: Home visit completed Next visit planned for one week

## 2020-07-16 NOTE — Telephone Encounter (Signed)
Heather w/paramedicine called to report pts weight is up 7lbs in a week but pt has stopped taking metolazone.  Pt advised to take metolazone but said he would rather get iv lasix. Per Dr.McLean pt needs to take his weekly metolazone if that fails he then we can try iv lasix. Heather aware and will follow up with pt.

## 2020-07-23 ENCOUNTER — Other Ambulatory Visit (HOSPITAL_COMMUNITY): Payer: Self-pay

## 2020-07-23 NOTE — Progress Notes (Signed)
Paramedicine Encounter    Patient ID: Charles Gray, male    DOB: Jul 25, 1955, 65 y.o.   MRN: [id number]   Patient Care Team: Enid Skeens., MD as PCP - General (Family Medicine) Berniece Salines, DO as PCP - Cardiology (Cardiology) Jorge Ny, LCSW as Social Worker (Licensed Clinical Social Worker)  Patient Active Problem List   Diagnosis Date Noted  . GERD without esophagitis 06/10/2020  . Atrial fibrillation with rapid ventricular response ([place]) 03/24/2020  . Acute on chronic right heart failure ([place]) 01/27/2020  . Acute respiratory failure with hypoxia ([place])   . Fatty liver disease, nonalcoholic   . Shock ([place])   . Personal history of DVT (deep vein thrombosis) 12/26/2019  . Chronic anticoagulation 12/26/2019  . Palpitations 12/26/2019  . AF (paroxysmal atrial fibrillation) ([place]) 12/23/2019  . Heart failure, systolic, acute ([place]) 03/49/1791  . Acute on chronic congestive heart failure ([place])   . Acute on chronic diastolic CHF (congestive heart failure) ([place]) 08/23/2019  . Redness and swelling of lower leg 08/23/2019  . Chest pain 08/23/2019  . Lactic acidosis 08/23/2019  . History of pulmonary embolism 08/12/2019  . Atrial tachycardia ([place]) 08/12/2019  . Occasional tremors 08/12/2019  . OSA (obstructive sleep apnea) 08/12/2019  . Acute on chronic right-sided heart failure ([place]) 08/06/2019  . Pulmonary embolism ([place]) 06/08/2019  . Normocytic anemia 06/08/2019  . Pulmonary emboli ([place]) 06/08/2019  . Chronic heart failure with preserved ejection fraction ([place]) 05/30/2019  . Class 2 severe obesity due to excess calories with serious comorbidity and body mass index (BMI) of 39.0 to 39.9 in adult ([place]) 05/30/2019  . Morbid obesity with BMI of 40.0-44.9, adult ([place]) 05/30/2019  . Hyponatremia   . Drug induced constipation   . Peripheral edema   . Hypotension due to drugs   . Hypoalbuminemia due to protein-calorie malnutrition ([place])   . Hypokalemia   . Thrombocytopenia  ([place])   . Anemia of chronic disease   . Cirrhosis of liver without ascites ([place])   . Chronic pain syndrome   . Debility 03/13/2019  . Portal hypertensive gastropathy ([place]) 03/06/2019  . Dyspnea   . GIB (gastrointestinal bleeding) 03/04/2019  . Mixed hyperlipidemia 01/24/2019  . Insomnia 01/24/2019  . Hyperlipidemia 01/24/2019  . Essential hypertension 01/24/2019  . Lumbar disc herniation 01/24/2019    Current Outpatient Medications:  .  acetaminophen (TYLENOL) 500 MG tablet, Take 1,000 mg by mouth every 6 (six) hours as needed for moderate pain or headache., Disp: , Rfl:  .  apixaban (ELIQUIS) 5 MG TABS tablet, Take 1 tablet (5 mg total) by mouth 2 (two) times daily., Disp: 60 tablet, Rfl: 0 .  Buprenorphine HCl-Naloxone HCl 8-2 MG FILM, Place 1 tablet under the tongue in the morning and at bedtime. , Disp: , Rfl:  .  docusate sodium (COLACE) 100 MG capsule, Take 1 capsule (100 mg total) by mouth daily as needed for mild constipation or moderate constipation., Disp: 30 capsule, Rfl: 2 .  doxycycline (VIBRAMYCIN) 50 MG capsule, TAKE 2 CAPSULES BY MOUTH TWICE A DAY FOR 14 DAYS. (Patient not taking: No sig reported), Disp: 56 capsule, Rfl: 0 .  eplerenone (INSPRA) 50 MG tablet, Take 2 tablets (100 mg total) by mouth daily., Disp: 60 tablet, Rfl: 3 .  gabapentin (NEURONTIN) 800 MG tablet, Take 1 tablet (800 mg total) by mouth 3 (three) times daily., Disp: 90 tablet, Rfl: 1 .  metolazone (ZAROXOLYN) 2.5 MG tablet, Take 1 tablet (2.5 mg total) by mouth once  a week. Every Friday, Disp: 90 tablet, Rfl: 0 .  metoprolol succinate (TOPROL XL) 25 MG 24 hr tablet, Take 1 tablet (25 mg total) by mouth 2 (two) times daily., Disp: 180 tablet, Rfl: 3 .  ondansetron (ZOFRAN ODT) 4 MG disintegrating tablet, Take 1 tablet (4 mg total) by mouth every 8 (eight) hours as needed for nausea or vomiting., Disp: 20 tablet, Rfl: 0 .  pantoprazole (PROTONIX) 40 MG tablet, Take 40 mg by mouth 2 (two) times daily., Disp:  , Rfl:  .  potassium chloride SA (KLOR-CON) 20 MEQ tablet, Take 40 meq in AM and 20 meq in PM, Disp: 45 tablet, Rfl: 3 .  pravastatin (PRAVACHOL) 20 MG tablet, Take 1 tablet (20 mg total) by mouth daily., Disp: 30 tablet, Rfl: 1 .  senna (SENOKOT) 8.6 MG TABS tablet, Take 2 tablets (17.2 mg total) by mouth daily., Disp: 120 tablet, Rfl: 0 .  sertraline (ZOLOFT) 25 MG tablet, Take 1 tablet (25 mg total) by mouth daily., Disp: 30 tablet, Rfl: 0 .  sodium chloride (OCEAN) 0.65 % SOLN nasal spray, Place 1 spray into both nostrils as needed for congestion., Disp: , Rfl:  .  torsemide (DEMADEX) 20 MG tablet, Take 4 tablets (80 mg total) by mouth every morning AND 3 tablets (60 mg total) every evening., Disp: 210 tablet, Rfl: 11 .  VENTOLIN HFA 108 (90 Base) MCG/ACT inhaler, Inhale 1 puff into the lungs every 4 (four) hours as needed for shortness of breath., Disp: 6.7 g, Rfl: 1 .  zolpidem (AMBIEN) 10 MG tablet, Take 10 mg by mouth at bedtime as needed for sleep., Disp: , Rfl:  Allergies  Allergen Reactions  . Penicillins     Broke out in convulsions as a child  Did it involve swelling of the face/tongue/throat, SOB, or low BP? N/A Did it involve sudden or severe rash/hives, skin peeling, or any reaction on the inside of your mouth or nose? N/A Did you need to seek medical attention at a hospital or doctor's office?N/A When did it last happen?N/A If all above answers are "NO", may proceed with cephalosporin use.     Social History   Socioeconomic History  . Marital status: Single    Spouse name: Not on file  . Number of children: Not on file  . Years of education: Not on file  . Highest education level: Not on file  Occupational History  . Not on file  Tobacco Use  . Smoking status: Never Smoker  . Smokeless tobacco: Never Used  Vaping Use  . Vaping Use: Never used  Substance and Sexual Activity  . Alcohol use: Not Currently  . Drug use: Not Currently    Types: Marijuana     Comment: Smoked for 30 years  . Sexual activity: Not on file  Other Topics Concern  . Not on file  Social History Narrative  . Not on file   Social Determinants of Health   Financial Resource Strain: Medium Risk  . Difficulty of Paying Living Expenses: Somewhat hard  Food Insecurity: No Food Insecurity  . Worried About Charity fundraiser in the Last Year: Never true  . Ran Out of Food in the Last Year: Never true  Transportation Needs: Unmet Transportation Needs  . Lack of Transportation (Medical): Yes  . Lack of Transportation (Non-Medical): No  Physical Activity: Not on file  Stress: Not on file  Social Connections: Not on file  Intimate Partner Violence: Not on file  Physical Exam Vitals reviewed.  Constitutional:      Appearance: Normal appearance. He is normal weight.  HENT:     Head: Normocephalic.     Nose: Nose normal.     Mouth/Throat:     Mouth: Mucous membranes are moist.     Pharynx: Oropharynx is clear.  Eyes:     Conjunctiva/sclera: Conjunctivae normal.     Pupils: Pupils are equal, round, and reactive to light.  Cardiovascular:     Rate and Rhythm: Normal rate and regular rhythm.     Pulses: Normal pulses.     Heart sounds: Normal heart sounds.  Pulmonary:     Effort: Pulmonary effort is normal. No respiratory distress.     Breath sounds: Normal breath sounds. No stridor. No wheezing, rhonchi or rales.  Chest:     Chest wall: No tenderness.  Abdominal:     General: There is distension.     Tenderness: There is abdominal tenderness.  Musculoskeletal:        General: Swelling and tenderness present. Normal range of motion.     Cervical back: Normal range of motion.     Right lower leg: Edema present.     Left lower leg: Edema present.  Skin:    General: Skin is warm and dry.     Capillary Refill: Capillary refill takes less than 2 seconds.  Neurological:     General: No focal deficit present.     Mental Status: He is alert. Mental status is at  baseline.  Psychiatric:        Mood and Affect: Mood normal.    Arrived for home visit for [place] who was alert and oriented reporting to be feeling "sick". When asked Reagan reports he feels nauseous and his legs are swollen, red and developing blisters. Dyllon reported his weight got up to 377 on Friday 4/8 and he took 1/2 of a 2.75m Metolazone and his weight went down to 368 on Saturday but he states that he felt very sick with upset stomach and weakness over the last several days after taking the 1/2 metolazone. [name] legs still swollen with redness and blistering today noted. Sagar says he has been compliant with medications EXCEPT METOLAZONE, pill box empty. I explained to Geovannie the importance of taking ALL prescribed medications and he continues to refuse to be consistent with the metolazone. He says he is not eating a lot of salt and only drinks 4 bottles of water a day. I advised Tajee to call his PCP office to set up a visit due to his history of frequent cellulitis. Eri agreed with plan. I obtained vitals and assessment as noted. Lung sounds clear. Abdomen distended with tenderness. Amarrion reports some more frequent cramping and constipation. He is only taking a stool softener MWF when I recommended it daily.   I reviewed medications and filled one pill box for Rhylan. [place] reminded of upcoming clinic visit. I set up transportation for him through [employer] they will call him with a pick up time day before. Tyreik request a nurse fill his pill box after clinic visit next week due to my absence, I will reach out to clinic staff. I also reminded [place] of [place] April 22nd at 10 at the HF clinic. Flavio reports he may go, he will set up his on ride to get there. I will see Stevan in two weeks. Home visit complete.    Refills: [facility]   Future Appointments  Date  Time Provider [place]  07/30/2020 10:30 AM [facility] PA/NP [facility] None     ACTION: Home visit  completed Next visit planned for two weeks

## 2020-07-30 ENCOUNTER — Other Ambulatory Visit: Payer: Self-pay

## 2020-07-30 ENCOUNTER — Encounter (HOSPITAL_COMMUNITY): Payer: Self-pay

## 2020-07-30 ENCOUNTER — Ambulatory Visit (HOSPITAL_COMMUNITY)
Admission: RE | Admit: 2020-07-30 | Discharge: 2020-07-30 | Disposition: A | Discharge status: Home / Self Care | Payer: Medicare HMO | Source: Ambulatory Visit | Attending: Adult Health | Admitting: Adult Health

## 2020-07-30 ENCOUNTER — Other Ambulatory Visit (HOSPITAL_COMMUNITY): Payer: Self-pay

## 2020-07-30 VITALS — BP 132/80 | HR 69 | Wt 369.8 lb

## 2020-07-30 DIAGNOSIS — Z86711 Personal history of pulmonary embolism: Secondary | ICD-10-CM | POA: Diagnosis not present

## 2020-07-30 DIAGNOSIS — I5033 Acute on chronic diastolic (congestive) heart failure: Secondary | ICD-10-CM | POA: Insufficient documentation

## 2020-07-30 DIAGNOSIS — F419 Anxiety disorder, unspecified: Secondary | ICD-10-CM | POA: Insufficient documentation

## 2020-07-30 DIAGNOSIS — I480 Paroxysmal atrial fibrillation: Secondary | ICD-10-CM

## 2020-07-30 DIAGNOSIS — Z7901 Long term (current) use of anticoagulants: Secondary | ICD-10-CM | POA: Insufficient documentation

## 2020-07-30 DIAGNOSIS — I2510 Atherosclerotic heart disease of native coronary artery without angina pectoris: Secondary | ICD-10-CM | POA: Insufficient documentation

## 2020-07-30 DIAGNOSIS — Z79899 Other long term (current) drug therapy: Secondary | ICD-10-CM | POA: Diagnosis not present

## 2020-07-30 DIAGNOSIS — I5032 Chronic diastolic (congestive) heart failure: Secondary | ICD-10-CM | POA: Diagnosis not present

## 2020-07-30 DIAGNOSIS — Z6838 Body mass index (BMI) 38.0-38.9, adult: Secondary | ICD-10-CM | POA: Diagnosis not present

## 2020-07-30 DIAGNOSIS — J449 Chronic obstructive pulmonary disease, unspecified: Secondary | ICD-10-CM | POA: Diagnosis not present

## 2020-07-30 DIAGNOSIS — G4733 Obstructive sleep apnea (adult) (pediatric): Secondary | ICD-10-CM | POA: Diagnosis not present

## 2020-07-30 DIAGNOSIS — Z86718 Personal history of other venous thrombosis and embolism: Secondary | ICD-10-CM | POA: Diagnosis not present

## 2020-07-30 DIAGNOSIS — R110 Nausea: Secondary | ICD-10-CM | POA: Diagnosis present

## 2020-07-30 DIAGNOSIS — I110 Hypertensive heart disease with heart failure: Secondary | ICD-10-CM | POA: Insufficient documentation

## 2020-07-30 DIAGNOSIS — I10 Essential (primary) hypertension: Secondary | ICD-10-CM

## 2020-07-30 DIAGNOSIS — M7989 Other specified soft tissue disorders: Secondary | ICD-10-CM | POA: Diagnosis not present

## 2020-07-30 DIAGNOSIS — R0602 Shortness of breath: Secondary | ICD-10-CM | POA: Diagnosis not present

## 2020-07-30 DIAGNOSIS — Z8249 Family history of ischemic heart disease and other diseases of the circulatory system: Secondary | ICD-10-CM | POA: Diagnosis not present

## 2020-07-30 LAB — BASIC METABOLIC PANEL
Anion gap: 9 (ref 5–15)
BUN: 13 mg/dL (ref 8–23)
CO2: 30 mmol/L (ref 22–32)
Calcium: 9.2 mg/dL (ref 8.9–10.3)
Chloride: 97 mmol/L — ABNORMAL LOW (ref 98–111)
Creatinine, Ser: 0.93 mg/dL (ref 0.61–1.24)
GFR, Estimated: >60 mL/min (ref >60)
Glucose, Bld: 121 mg/dL — ABNORMAL HIGH (ref 70–99)
Potassium: 3.7 mmol/L (ref 3.5–5.1)
Sodium: 136 mmol/L (ref 135–145)

## 2020-07-30 NOTE — Progress Notes (Signed)
[facility]ED [facility] CLIN[facility]  NOTE  Primary Care: Enid Skeens., MD Primary Cardiologist: Dr. Berniece Salines, DO  HF Cardiology: Dr. [name]  HPI: 65 y.o. 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    He has had multiple hospitalizations in 2021 for CHF exacerbation, requiring IV Lasix and change in PO regimen from lasix to torsemide. Echo 4/21 showed normal LVEF 55-60% w/ G1DD. RV not well visualized, but felt to have component of RV failure. [place] 4/21 showed normal PA pressure and normal cardiac output. PFTs also completed and c/w mild obstructive airway disease.   In 7/21, he had a coronary CTA.  This showed no coronary calcium but there was a concern for soft plaque in the distal LCx and distal RCA with hemodynamically significant stenosis.  He was managed medically. He has not had any chest pain, but was started on ranolazine after his CTA.  He feels like it makes his "head fuzzy."    Admitted with A/C diastolic heart failure. Diuresed with IV lasix and transitioned to bumex 3 mg twice a day. Discharge weight 360 pounds. Discharged 02/09/2020.   He was admitted again in 12/21 with CHF.  Echo in 12/21 showed EF 40-08%, grade 2 diastolic dysfunction, RV poorly visualized.   OV on 12/21 still having significant exertional dyspnea, short of breath just walking around the house.  + Orthopnea, has to sleep on the sofa.  Sleeps poorly, cannot tolerate CPAP.  Cardiomems was discussed but patient reluctant, Jardiance added, metolazone increased to 2 x/week, metoprolol stopped, and Toprol XL 25 mg daily started.  Admitted 6/76/19 with A/C diastolic heart failure. Diuresed with IV lasix + metolazone. Transitioned to torsemide 60 mg twice a day and metolazone twice a day. D/C weight 364 pounds.   Today he returns for HF follow up.Overall feeling ok but having some nausea.  Remains SOB with exertion. Denies  PND/Orthopnea. Appetite ok. Drinking ~ 64 ounces of fluid per day. No fever or chills. Weight at home 370-377 pounds. Taking all medications but does not like taking metolazone. Last week he took metolazone 3 times. Continue with HHPT.  Followed closely by HF Paramedicine. Requires assistance with transportation.    Labs (6/21): K 4.3, creatinine 1.03 Labs (10/21): LDL 73 Labs (12/21): K 3.8, creatinine 0.98 Labs 06/13/20: K 3.1 Creatinine 0.9   PMH: 1. HTN 2. OSA 3. Hyperlipidemia 4. Low back pain 5. PE/DVT in 2/21.  - V/Q scan in 6/21 was not suggestive of chronic PE.  6. Nonalcoholic steatohepatitis.  7. GI bleed in 10/20.  8. Anxiety 9. CAD: Coronary CTA in 7/21 with calcium score 0 but concern for soft plaque distal CFX and RCA.  By [facility], there was flow-limiting stenosis in distal CFX and distal RCA.  10. Chronic diastolic CHF: Echo in 5/09 with EF 55-60%, RV poorly visualized.  - RHC (4/21): mean RA 8, PA 20/8, mean PCWP 11, CI 2.8 - Echo (12/21):EF 32-67%, grade 2 diastolic dysfunction, RV poorly visualized.   11. PFTs (4/21): Mild obstruction.  12. ABIs (10/21): Normal.  Review of Systems: All systems reviewed and negative except as per HPI.    Current Outpatient Medications  Medication Sig Dispense Refill  . acetaminophen (TYLENOL) 500 MG tablet Take 1,000 mg by mouth every 6 (six) hours as needed for moderate pain or headache.    Marland Kitchen apixaban (ELIQUIS) 5 MG TABS tablet Take 1 tablet (5 mg total) by mouth  2 (two) times daily. 60 tablet 0  . Buprenorphine HCl-Naloxone HCl 8-2 MG FILM Place 1 tablet under the tongue in the morning and at bedtime.     . cephALEXin (KEFLEX) 500 MG capsule Take 500 mg by mouth 4 (four) times daily. X 10 days    . docusate sodium (COLACE) 100 MG capsule Take 1 capsule (100 mg total) by mouth daily as needed for mild constipation or moderate constipation. 30 capsule 2  . eplerenone (INSPRA) 50 MG tablet Take 2 tablets (100 mg total) by mouth daily. 60  tablet 3  . gabapentin (NEURONTIN) 800 MG tablet Take 1 tablet (800 mg total) by mouth 3 (three) times daily. 90 tablet 1  . metolazone (ZAROXOLYN) 2.5 MG tablet Take 1 tablet (2.5 mg total) by mouth once a week. Every Friday 90 tablet 0  . metoprolol succinate (TOPROL XL) 25 MG 24 hr tablet Take 1 tablet (25 mg total) by mouth 2 (two) times daily. 180 tablet 3  . ondansetron (ZOFRAN ODT) 4 MG disintegrating tablet Take 1 tablet (4 mg total) by mouth every 8 (eight) hours as needed for nausea or vomiting. 20 tablet 0  . pantoprazole (PROTONIX) 40 MG tablet Take 40 mg by mouth 2 (two) times daily.    . potassium chloride SA (KLOR-CON) 20 MEQ tablet Take 40 meq in AM and 20 meq in PM 45 tablet 3  . pravastatin (PRAVACHOL) 20 MG tablet Take 1 tablet (20 mg total) by mouth daily. 30 tablet 1  . senna (SENOKOT) 8.6 MG TABS tablet Take 2 tablets (17.2 mg total) by mouth daily. 120 tablet 0  . sertraline (ZOLOFT) 25 MG tablet Take 1 tablet (25 mg total) by mouth daily. 30 tablet 0  . sodium chloride (OCEAN) 0.65 % SOLN nasal spray Place 1 spray into both nostrils as needed for congestion.    . torsemide (DEMADEX) 20 MG tablet Take 4 tablets (80 mg total) by mouth every morning AND 3 tablets (60 mg total) every evening. 210 tablet 11  . VENTOLIN HFA 108 (90 Base) MCG/ACT inhaler Inhale 1 puff into the lungs every 4 (four) hours as needed for shortness of breath. 6.7 g 1  . zolpidem (AMBIEN) 10 MG tablet Take 10 mg by mouth at bedtime as needed for sleep.     No current facility-administered medications for this encounter.   Allergies  Allergen Reactions  . Penicillins     Broke out in convulsions as a child  Did it involve swelling of the face/tongue/throat, SOB, or low BP? N/A Did it involve sudden or severe rash/hives, skin peeling, or any reaction on the inside of your mouth or nose? N/A Did you need to seek medical attention at a hospital or doctor's office?N/A When did it last happen?N/A If  all above answers are "NO", may proceed with cephalosporin use.   Social History   Socioeconomic History  . Marital status: Single    Spouse name: Not on file  . Number of children: Not on file  . Years of education: Not on file  . Highest education level: Not on file  Occupational History  . Not on file  Tobacco Use  . Smoking status: Never Smoker  . Smokeless tobacco: Never Used  Vaping Use  . Vaping Use: Never used  Substance and Sexual Activity  . Alcohol use: Not Currently  . Drug use: Not Currently    Types: Marijuana    Comment: Smoked for 30 years  . Sexual activity:  Not on file  Other Topics Concern  . Not on file  Social History Narrative  . Not on file   Social Determinants of Health   Financial Resource Strain: Medium Risk  . Difficulty of Paying Living Expenses: Somewhat hard  Food Insecurity: No Food Insecurity  . Worried About Charity fundraiser in the Last Year: Never true  . Ran Out of Food in the Last Year: Never true  Transportation Needs: Unmet Transportation Needs  . Lack of Transportation (Medical): Yes  . Lack of Transportation (Non-Medical): No  Physical Activity: Not on file  Stress: Not on file  Social Connections: Not on file  Intimate Partner Violence: Not on file    Family History  Problem Relation Age of Onset  . Hyperlipidemia Mother   . Hypertension Mother   . Stroke Mother   . CAD Maternal Grandmother   . CAD Maternal Grandfather     Vitals:   07/30/20 1040  BP: 132/80  Pulse: 69  SpO2: 95%  Weight: (!) 167.7 kg (369 lb 12.8 oz)     Wt Readings from Last 3 Encounters:  07/30/20 (!) 167.7 kg (369 lb 12.8 oz)  07/16/20 (!) 170.1 kg (375 lb)  07/09/20 (!) 167.4 kg (369 lb)    PHYSICAL EXAM: General:  Well appearing. No resp difficulty HEENT: normal Neck: supple. no JVD. Carotids 2+ bilat; no bruits. No lymphadenopathy or thryomegaly appreciated. Cor: PMI nondisplaced. Regular rate & rhythm. No rubs, gallops or  murmurs. Lungs: clear Abdomen: soft, nontender, nondistended. No hepatosplenomegaly. No bruits or masses. Good bowel sounds. Extremities: no cyanosis, clubbing, rash, R and LLE 1+ edema Neuro: alert & orientedx3, cranial nerves grossly intact. moves all 4 extremities w/o difficulty. Affect pleasant  ASSESSMENT & PLAN: 1. Chronic diastolic CHF: Echo in 78/67 with EF 67-20%, grade 2 diastolic dysfunction, RV poorly visualized.  [place] in 4/21 actually showed optimized filling pressures and no pulmonary hypertension.  Patient has had multiple admissions this year with CHF.   - Ideally, would have Cardiomems but he declines.   - NYHA III . Volume status looks ok today.  - He did not tolerate jardiance due to GI upset.  - Continue torsemide 80 mg in am and continue 60 mg in pm. - Continue eplerenone 100 mg daily + potassium 40/20 meq. - Continue metolazone weekly and as needed.   - Check BMET today.  -2. H/o PE/DVT: Suspect due to sedentary lifestyle.  Occurred in 2/21.  V/Q scan in 6/21 did not show evidence for chronic PE.   - Continue apixaban.  - No bleeding issues.  3. OSA: Cannot tolerate CPAP.  4. Anxiety/panic:  - Continue sertraline 25 mg daily.  5. CAD: Coronary CTA showed suspected soft plaque distal RCA and distal LCX that is hemodynamically significant by FFR, but coronary calcium score surprisingly is 0. No chest pain.   - Continue statin, LDL 73 in 10/21.  6. Hypertension: Stable today.  - Continue Toprol XL 25 mg daily (felt metoprolol 50 mg bid made him lightheaded). 7. Atrial fibrillation: Paroxysmal. - Regular on exam.   -  Continue apixaban. No bleeding issues.  8. Right leg swelling: resolving, LE dopplers (1/22) negative for DVT. Completed Keflex. 9. Morbid Obesity: Body mass index is 38.67 kg/m.  Continue HF Paramedicine. He remains at high risk for readmit due to poor insight. Follow up 4-5 weeks.   Greater than 50% of the (total minutes 25) visit spent in  counseling/coordination of care regarding the  above.      Shiya Fogelman NP-C  07/30/2020

## 2020-07-30 NOTE — Patient Instructions (Signed)
It was great to see you today! No medication changes are needed at this time.  Labs today We will only contact you if something comes back abnormal or we need to make some changes. Otherwise no news is good news!  Your physician recommends that you schedule a follow-up appointment in: 4-5 weeks  Do the following things EVERYDAY: 1) Weigh yourself in the morning before breakfast. Write it down and keep it in a log. 2) Take your medicines as prescribed 3) Eat low salt foods--Limit salt (sodium) to 2000 mg per day.  4) Stay as active as you can everyday 5) Limit all fluids for the day to less than 2 liters  At the [facility], you and your health needs are our priority. As part of our continuing mission to provide you with exceptional heart care, we have created designated Provider Care Teams. These Care Teams include your primary Cardiologist (physician) and Advanced Practice Providers (APPs- Physician Assistants and Nurse Practitioners) who all work together to provide you with the care you need, when you need it.   You may see any of the following providers on your designated Care Team at your next follow up: Marland Kitchen Dr Glori Bickers . Dr Loralie Champagne . Dr Vickki Muff . [name], NP . Lyda Jester, [place] . Audry Riles, PharmD   Please be sure to bring in all your medications bottles to every appointment.   If you have any questions or concerns before your next appointment please send [place] a message through [place] or call our office at [phone].    TO LEAVE A MESSAGE FOR THE NURSE SELECT OPTION 2, PLEASE LEAVE A MESSAGE INCLUDING: . YOUR NAME . DATE OF BIRTH . CALL BACK NUMBER . REASON FOR CALL**this is important as we prioritize the call backs  YOU WILL RECEIVE A CALL BACK THE SAME DAY AS LONG AS YOU CALL BEFORE 4:00 PM

## 2020-07-30 NOTE — Progress Notes (Signed)
Paramedicine Encounter   Patient ID: Tri Chittick , male,   DOB: [birth year],64 y.o.,  MRN: [id number]   Met patient in clinic today with provider.   Weight @ clinic-369 B/p-132/80 p-69 [id number]  Pt reports he is not feeling well, he said he broke out in a sweat this morning. Sick on stomach.  He said he was put on antibiotic for infection for his legs. His PCP prescribed it for him.  He told nurse that the takes metolazone a few days ago, and he took it several days in a row due to his swelling in his legs and feet. Last time he took it was 5 days ago. Last week he took it 3 times a week.  Still has PT. Today may be last visit from them.  [name] said his legs didn't look bad today. His legs are red and scaly, but not bad.  She wants him to use compression socks.  Weights at home 370-373 Sob upon exertion-same as usual.  He reports compliant with fluid intake to 4 bottles of water a day. He tries too--- [name] seemed pleased with how he looks today. If he can lessen his fluid intake and sodium intake he probably doesn't have to self-medicate the metolazone as much.  He has not been taking his antibiotics with food-I advised him to take with food to ease the stomach upset.  He also states he has been eating the probiotic yogurt too so that could be causing his loose stools.   Med changes:NONE  Refills needed: [id number]                          Potassium- [place], [place] 07/30/2020

## 2020-08-06 ENCOUNTER — Other Ambulatory Visit (HOSPITAL_COMMUNITY): Payer: Self-pay

## 2020-08-06 NOTE — Progress Notes (Signed)
Paramedicine Encounter    Patient ID: Charles Gray, male    DOB: 1955-07-27, 65 y.o.   MRN: [id number]   Patient Care Team: Enid Skeens., MD as PCP - General (Family Medicine) Berniece Salines, DO as PCP - Cardiology (Cardiology) Jorge Ny, LCSW as Social Worker (Licensed Clinical Social Worker)  Patient Active Problem List   Diagnosis Date Noted  . GERD without esophagitis 06/10/2020  . Atrial fibrillation with rapid ventricular response ([place]) 03/24/2020  . Acute on chronic right heart failure ([place]) 01/27/2020  . Acute respiratory failure with hypoxia ([place])   . Fatty liver disease, nonalcoholic   . Shock ([place])   . Personal history of DVT (deep vein thrombosis) 12/26/2019  . Chronic anticoagulation 12/26/2019  . Palpitations 12/26/2019  . AF (paroxysmal atrial fibrillation) ([place]) 12/23/2019  . Heart failure, systolic, acute ([place]) 97/58/8325  . Acute on chronic congestive heart failure ([place])   . Acute on chronic diastolic CHF (congestive heart failure) ([place]) 08/23/2019  . Redness and swelling of lower leg 08/23/2019  . Chest pain 08/23/2019  . Lactic acidosis 08/23/2019  . History of pulmonary embolism 08/12/2019  . Atrial tachycardia ([place]) 08/12/2019  . Occasional tremors 08/12/2019  . OSA (obstructive sleep apnea) 08/12/2019  . Acute on chronic right-sided heart failure ([place]) 08/06/2019  . Pulmonary embolism ([place]) 06/08/2019  . Normocytic anemia 06/08/2019  . Pulmonary emboli ([place]) 06/08/2019  . Chronic heart failure with preserved ejection fraction ([place]) 05/30/2019  . Class 2 severe obesity due to excess calories with serious comorbidity and body mass index (BMI) of 39.0 to 39.9 in adult ([place]) 05/30/2019  . Morbid obesity with BMI of 40.0-44.9, adult ([place]) 05/30/2019  . Hyponatremia   . Drug induced constipation   . Peripheral edema   . Hypotension due to drugs   . Hypoalbuminemia due to protein-calorie malnutrition ([place])   . Hypokalemia   . Thrombocytopenia  ([place])   . Anemia of chronic disease   . Cirrhosis of liver without ascites ([place])   . Chronic pain syndrome   . Debility 03/13/2019  . Portal hypertensive gastropathy ([place]) 03/06/2019  . Dyspnea   . GIB (gastrointestinal bleeding) 03/04/2019  . Mixed hyperlipidemia 01/24/2019  . Insomnia 01/24/2019  . Hyperlipidemia 01/24/2019  . Essential hypertension 01/24/2019  . Lumbar disc herniation 01/24/2019    Current Outpatient Medications:  .  acetaminophen (TYLENOL) 500 MG tablet, Take 1,000 mg by mouth every 6 (six) hours as needed for moderate pain or headache., Disp: , Rfl:  .  apixaban (ELIQUIS) 5 MG TABS tablet, Take 1 tablet (5 mg total) by mouth 2 (two) times daily., Disp: 60 tablet, Rfl: 0 .  Buprenorphine HCl-Naloxone HCl 8-2 MG FILM, Place 1 tablet under the tongue in the morning and at bedtime. , Disp: , Rfl:  .  cephALEXin (KEFLEX) 500 MG capsule, Take 500 mg by mouth 4 (four) times daily. X 10 days, Disp: , Rfl:  .  docusate sodium (COLACE) 100 MG capsule, Take 1 capsule (100 mg total) by mouth daily as needed for mild constipation or moderate constipation., Disp: 30 capsule, Rfl: 2 .  eplerenone (INSPRA) 50 MG tablet, Take 2 tablets (100 mg total) by mouth daily., Disp: 60 tablet, Rfl: 3 .  gabapentin (NEURONTIN) 800 MG tablet, Take 1 tablet (800 mg total) by mouth 3 (three) times daily., Disp: 90 tablet, Rfl: 1 .  metolazone (ZAROXOLYN) 2.5 MG tablet, Take 1 tablet (2.5 mg total) by mouth once a week. Every Friday, Disp: 90  tablet, Rfl: 0 .  metoprolol succinate (TOPROL XL) 25 MG 24 hr tablet, Take 1 tablet (25 mg total) by mouth 2 (two) times daily., Disp: 180 tablet, Rfl: 3 .  ondansetron (ZOFRAN ODT) 4 MG disintegrating tablet, Take 1 tablet (4 mg total) by mouth every 8 (eight) hours as needed for nausea or vomiting., Disp: 20 tablet, Rfl: 0 .  pantoprazole (PROTONIX) 40 MG tablet, Take 40 mg by mouth 2 (two) times daily., Disp: , Rfl:  .  potassium chloride SA (KLOR-CON) 20  MEQ tablet, Take 40 meq in AM and 20 meq in PM, Disp: 45 tablet, Rfl: 3 .  pravastatin (PRAVACHOL) 20 MG tablet, Take 1 tablet (20 mg total) by mouth daily., Disp: 30 tablet, Rfl: 1 .  senna (SENOKOT) 8.6 MG TABS tablet, Take 2 tablets (17.2 mg total) by mouth daily., Disp: 120 tablet, Rfl: 0 .  sertraline (ZOLOFT) 25 MG tablet, Take 1 tablet (25 mg total) by mouth daily., Disp: 30 tablet, Rfl: 0 .  sodium chloride (OCEAN) 0.65 % SOLN nasal spray, Place 1 spray into both nostrils as needed for congestion., Disp: , Rfl:  .  torsemide (DEMADEX) 20 MG tablet, Take 4 tablets (80 mg total) by mouth every morning AND 3 tablets (60 mg total) every evening., Disp: 210 tablet, Rfl: 11 .  VENTOLIN HFA 108 (90 Base) MCG/ACT inhaler, Inhale 1 puff into the lungs every 4 (four) hours as needed for shortness of breath., Disp: 6.7 g, Rfl: 1 .  zolpidem (AMBIEN) 10 MG tablet, Take 10 mg by mouth at bedtime as needed for sleep., Disp: , Rfl:  Allergies  Allergen Reactions  . Penicillins     Broke out in convulsions as a child  Did it involve swelling of the face/tongue/throat, SOB, or low BP? N/A Did it involve sudden or severe rash/hives, skin peeling, or any reaction on the inside of your mouth or nose? N/A Did you need to seek medical attention at a hospital or doctor's office?N/A When did it last happen?N/A If all above answers are "NO", may proceed with cephalosporin use.     Social History   Socioeconomic History  . Marital status: Single    Spouse name: Not on file  . Number of children: Not on file  . Years of education: Not on file  . Highest education level: Not on file  Occupational History  . Not on file  Tobacco Use  . Smoking status: Never Smoker  . Smokeless tobacco: Never Used  Vaping Use  . Vaping Use: Never used  Substance and Sexual Activity  . Alcohol use: Not Currently  . Drug use: Not Currently    Types: Marijuana    Comment: Smoked for 30 years  . Sexual activity: Not  on file  Other Topics Concern  . Not on file  Social History Narrative  . Not on file   Social Determinants of Health   Financial Resource Strain: Medium Risk  . Difficulty of Paying Living Expenses: Somewhat hard  Food Insecurity: No Food Insecurity  . Worried About Charity fundraiser in the Last Year: Never true  . Ran Out of Food in the Last Year: Never true  Transportation Needs: Unmet Transportation Needs  . Lack of Transportation (Medical): Yes  . Lack of Transportation (Non-Medical): No  Physical Activity: Not on file  Stress: Not on file  Social Connections: Not on file  Intimate Partner Violence: Not on file    Physical Exam Vitals reviewed.  Constitutional:      Appearance: He is normal weight.  HENT:     Head: Normocephalic.     Nose: Nose normal.     Mouth/Throat:     Mouth: Mucous membranes are moist.     Pharynx: Oropharynx is clear.  Eyes:     Conjunctiva/sclera: Conjunctivae normal.     Pupils: Pupils are equal, round, and reactive to light.  Cardiovascular:     Rate and Rhythm: Normal rate and regular rhythm.     Pulses: Normal pulses.     Heart sounds: Normal heart sounds.  Pulmonary:     Effort: Pulmonary effort is normal.  Abdominal:     Palpations: Abdomen is soft.     Tenderness: There is abdominal tenderness.  Musculoskeletal:        General: Swelling present. Normal range of motion.     Cervical back: Normal range of motion.     Right lower leg: No edema.     Left lower leg: No edema.  Skin:    General: Skin is warm and dry.     Capillary Refill: Capillary refill takes less than 2 seconds.  Neurological:     General: No focal deficit present.     Mental Status: He is alert. Mental status is at baseline.  Psychiatric:        Mood and Affect: Mood normal.     Arrived for home visit for Mustafa who was alert and oriented reporting to be feeling bad today. He says his stomach is hurting with lower abdominal pain described as a sharp,  cramping pain with constipation and nausea. Truth has been taking stool softener and he says this helps some but doesn't make his stool soft and his BM's are irregular. I encouraged Miralax which he has at home. Mechel agreed. Joban reports he took two metolazone in the last week to try to get his fluid down. [name]'s weight down by 5 lbs and no edema noted on assessment. Some swelling noted to right foot, patient is also being treated for cellulitis with Keflex. Patient has three days left of same as he has skipped doses. Deane's vitals were obtained. I encouraged Coren to eat a meal when taking the Keflex and other medications as he has been taking it on an empty stomach. Claudia was instructed to only take one metolazone a week. He was advised to not take any more this week, he agreed with plan. Jahad reports feeling dizzy. I checked orthostatic vitals and his BP and HR had a 10pt difference indicating some dehydration. I encouraged him to increase his oral intake over the next 24 hours, he agreed. I advised him to keep me updated on his symptoms and if they worsen to call me and or 911. He agreed with this plan. Demetres's medications were reviewed and confirmed. Pill box filled accordingly. I plan to see Norberto in one week. Home visit complete. I will call Jarnell tomorrow to check in.   Refills- Gabapentin Eliquis      Future Appointments  Date Time Provider [place]  09/10/2020 11:30 AM [facility] PA/NP [facility] None     ACTION: Home visit completed Next visit planned for one week

## 2020-08-09 ENCOUNTER — Encounter (HOSPITAL_COMMUNITY): Payer: Self-pay

## 2020-08-12 ENCOUNTER — Telehealth (HOSPITAL_COMMUNITY): Payer: Self-pay

## 2020-08-12 NOTE — Telephone Encounter (Signed)
10:00- Spoke to [facility] who reports his legs are very swollen, red and purple in color with blisters and weeping of fluid. Lindy recently completed antibiotic for Cellulitis treated by his PCP. No improvements noted from last weeks visit to pictures he sent this morning of his legs which were severely swollen with discoloration. I advised him to call his PCP for further advice and if symptoms are to worsen to call me and/ or call EMS.    Update as of 16:45-  Nirvan stated he has had GI upset over the weekend with constipation and stomach cramps as well as a syncopal episode yesterday which he reports collapsing and "blacking out" for a few seconds. He denied any injuries from same. I encouraged him to seek further care at the ER and he agreed. He reports he will call EMS this evening and be transported to [facility] for further.    I will follow up with [facility] tomorrow depending on hospital visit or not.

## 2020-08-13 ENCOUNTER — Other Ambulatory Visit (HOSPITAL_COMMUNITY): Payer: Self-pay

## 2020-08-13 ENCOUNTER — Inpatient Hospital Stay (HOSPITAL_COMMUNITY)
Admission: EM | Admit: 2020-08-13 | Discharge: 2020-08-21 | DRG: 603 | Disposition: A | Discharge status: Home / Self Care | Payer: Medicare HMO | Attending: Family Medicine | Admitting: Family Medicine

## 2020-08-13 ENCOUNTER — Encounter (HOSPITAL_COMMUNITY): Payer: Self-pay

## 2020-08-13 DIAGNOSIS — L03116 Cellulitis of left lower limb: Secondary | ICD-10-CM | POA: Diagnosis not present

## 2020-08-13 DIAGNOSIS — E782 Mixed hyperlipidemia: Secondary | ICD-10-CM | POA: Diagnosis present

## 2020-08-13 DIAGNOSIS — E46 Unspecified protein-calorie malnutrition: Secondary | ICD-10-CM | POA: Diagnosis present

## 2020-08-13 DIAGNOSIS — G4733 Obstructive sleep apnea (adult) (pediatric): Secondary | ICD-10-CM | POA: Diagnosis present

## 2020-08-13 DIAGNOSIS — L03115 Cellulitis of right lower limb: Secondary | ICD-10-CM | POA: Diagnosis not present

## 2020-08-13 DIAGNOSIS — E441 Mild protein-calorie malnutrition: Secondary | ICD-10-CM | POA: Diagnosis present

## 2020-08-13 DIAGNOSIS — E8809 Other disorders of plasma-protein metabolism, not elsewhere classified: Secondary | ICD-10-CM | POA: Diagnosis present

## 2020-08-13 DIAGNOSIS — I878 Other specified disorders of veins: Secondary | ICD-10-CM | POA: Diagnosis present

## 2020-08-13 DIAGNOSIS — Z6839 Body mass index (BMI) 39.0-39.9, adult: Secondary | ICD-10-CM

## 2020-08-13 DIAGNOSIS — G894 Chronic pain syndrome: Secondary | ICD-10-CM | POA: Diagnosis present

## 2020-08-13 DIAGNOSIS — G4700 Insomnia, unspecified: Secondary | ICD-10-CM | POA: Diagnosis present

## 2020-08-13 DIAGNOSIS — I10 Essential (primary) hypertension: Secondary | ICD-10-CM | POA: Diagnosis present

## 2020-08-13 DIAGNOSIS — Z880 Allergy status to penicillin: Secondary | ICD-10-CM

## 2020-08-13 DIAGNOSIS — Z83438 Family history of other disorder of lipoprotein metabolism and other lipidemia: Secondary | ICD-10-CM

## 2020-08-13 DIAGNOSIS — Z86718 Personal history of other venous thrombosis and embolism: Secondary | ICD-10-CM

## 2020-08-13 DIAGNOSIS — D638 Anemia in other chronic diseases classified elsewhere: Secondary | ICD-10-CM | POA: Diagnosis present

## 2020-08-13 DIAGNOSIS — Z8249 Family history of ischemic heart disease and other diseases of the circulatory system: Secondary | ICD-10-CM

## 2020-08-13 DIAGNOSIS — K760 Fatty (change of) liver, not elsewhere classified: Secondary | ICD-10-CM | POA: Diagnosis present

## 2020-08-13 DIAGNOSIS — E041 Nontoxic single thyroid nodule: Secondary | ICD-10-CM

## 2020-08-13 DIAGNOSIS — L309 Dermatitis, unspecified: Secondary | ICD-10-CM | POA: Diagnosis present

## 2020-08-13 DIAGNOSIS — D509 Iron deficiency anemia, unspecified: Secondary | ICD-10-CM | POA: Diagnosis present

## 2020-08-13 DIAGNOSIS — Z823 Family history of stroke: Secondary | ICD-10-CM

## 2020-08-13 DIAGNOSIS — D696 Thrombocytopenia, unspecified: Secondary | ICD-10-CM | POA: Diagnosis present

## 2020-08-13 DIAGNOSIS — I110 Hypertensive heart disease with heart failure: Secondary | ICD-10-CM | POA: Diagnosis present

## 2020-08-13 DIAGNOSIS — Z86711 Personal history of pulmonary embolism: Secondary | ICD-10-CM | POA: Diagnosis present

## 2020-08-13 DIAGNOSIS — I5032 Chronic diastolic (congestive) heart failure: Secondary | ICD-10-CM | POA: Diagnosis present

## 2020-08-13 DIAGNOSIS — Z7901 Long term (current) use of anticoagulants: Secondary | ICD-10-CM

## 2020-08-13 DIAGNOSIS — I872 Venous insufficiency (chronic) (peripheral): Secondary | ICD-10-CM | POA: Diagnosis present

## 2020-08-13 DIAGNOSIS — I480 Paroxysmal atrial fibrillation: Secondary | ICD-10-CM | POA: Diagnosis present

## 2020-08-13 DIAGNOSIS — E876 Hypokalemia: Secondary | ICD-10-CM | POA: Diagnosis present

## 2020-08-13 DIAGNOSIS — L0390 Cellulitis, unspecified: Secondary | ICD-10-CM | POA: Diagnosis present

## 2020-08-13 DIAGNOSIS — Z79899 Other long term (current) drug therapy: Secondary | ICD-10-CM

## 2020-08-13 DIAGNOSIS — Z20822 Contact with and (suspected) exposure to covid-19: Secondary | ICD-10-CM | POA: Diagnosis present

## 2020-08-13 LAB — CBC WITH DIFFERENTIAL/PLATELET
Abs Immature Granulocytes: 0.04 10*3/uL (ref 0.00–0.07)
Basophils Absolute: 0 10*3/uL (ref 0.0–0.1)
Basophils Relative: 0 %
Eosinophils Absolute: 0.2 10*3/uL (ref 0.0–0.5)
Eosinophils Relative: 2 %
HCT: 38.6 % — ABNORMAL LOW (ref 39.0–52.0)
Hemoglobin: 11.7 g/dL — ABNORMAL LOW (ref 13.0–17.0)
Immature Granulocytes: 0 %
Lymphocytes Relative: 12 %
Lymphs Abs: 1.1 10*3/uL (ref 0.7–4.0)
MCH: 24.2 pg — ABNORMAL LOW (ref 26.0–34.0)
MCHC: 30.3 g/dL (ref 30.0–36.0)
MCV: 79.8 fL — ABNORMAL LOW (ref 80.0–100.0)
Monocytes Absolute: 0.8 10*3/uL (ref 0.1–1.0)
Monocytes Relative: 9 %
Neutro Abs: 7.4 10*3/uL (ref 1.7–7.7)
Neutrophils Relative %: 77 %
Platelets: 160 10*3/uL (ref 150–400)
RBC: 4.84 MIL/uL (ref 4.22–5.81)
RDW: 15.4 % (ref 11.5–15.5)
WBC: 9.6 10*3/uL (ref 4.0–10.5)
nRBC: 0 % (ref 0.0–0.2)

## 2020-08-13 LAB — URINALYSIS, ROUTINE W REFLEX MICROSCOPIC
Bilirubin Urine: NEGATIVE
Glucose, UA: NEGATIVE mg/dL
Hgb urine dipstick: NEGATIVE
Ketones, ur: NEGATIVE mg/dL
Leukocytes,Ua: NEGATIVE
Nitrite: NEGATIVE
Protein, ur: NEGATIVE mg/dL
Specific Gravity, Urine: 1.011 (ref 1.005–1.030)
pH: 6 (ref 5.0–8.0)

## 2020-08-13 LAB — COMPREHENSIVE METABOLIC PANEL
ALT: 22 U/L (ref 0–44)
AST: 38 U/L (ref 15–41)
Albumin: 3.4 g/dL — ABNORMAL LOW (ref 3.5–5.0)
Alkaline Phosphatase: 85 U/L (ref 38–126)
Anion gap: 7 (ref 5–15)
BUN: 19 mg/dL (ref 8–23)
CO2: 37 mmol/L — ABNORMAL HIGH (ref 22–32)
Calcium: 9 mg/dL (ref 8.9–10.3)
Chloride: 89 mmol/L — ABNORMAL LOW (ref 98–111)
Creatinine, Ser: 0.94 mg/dL (ref 0.61–1.24)
GFR, Estimated: >60 mL/min (ref >60)
Glucose, Bld: 107 mg/dL — ABNORMAL HIGH (ref 70–99)
Potassium: 3.1 mmol/L — ABNORMAL LOW (ref 3.5–5.1)
Sodium: 133 mmol/L — ABNORMAL LOW (ref 135–145)
Total Bilirubin: 0.5 mg/dL (ref 0.3–1.2)
Total Protein: 7.4 g/dL (ref 6.5–8.1)

## 2020-08-13 LAB — LACTIC ACID, PLASMA: Lactic Acid, Venous: 1.8 mmol/L (ref 0.5–1.9)

## 2020-08-13 LAB — CK: Total CK: 124 U/L (ref 49–397)

## 2020-08-13 LAB — BRAIN NATRIURETIC PEPTIDE: B Natriuretic Peptide: 47.5 pg/mL (ref 0.0–100.0)

## 2020-08-13 LAB — LIPASE, BLOOD: Lipase: 52 U/L — ABNORMAL HIGH (ref 11–51)

## 2020-08-13 MED ORDER — FUROSEMIDE 10 MG/ML IJ SOLN
60.0000 mg | Freq: Once | INTRAMUSCULAR | Status: AC
  Administered 2020-08-14: 60 mg via INTRAVENOUS
  Filled 2020-08-13: qty 6

## 2020-08-13 MED ORDER — CLINDAMYCIN PHOSPHATE 600 MG/50ML IV SOLN
600.0000 mg | Freq: Once | INTRAVENOUS | Status: AC
  Administered 2020-08-14: 600 mg via INTRAVENOUS
  Filled 2020-08-13: qty 50

## 2020-08-13 NOTE — ED Provider Notes (Signed)
Emergency Medicine Provider Triage Evaluation Note  Charles Gray , a 65 y.o. male  was evaluated in triage.  Pt complains of bilateral leg pain, swelling and recurrent cellulitis.  Was seen by community paramedicine and referred to the ER.  He has reportedly had fatigue with dark urine x3 days, dizziness for one week.  He completed his keflex on Friday 4/29 per report Weight is up 4 pounds  Review of Systems  Positive: Leg swelling, fatigue, dark urine Negative: fevers  Physical Exam  BP 94/68 (BP Location: Left Arm)   Pulse 90   Temp 98.7 F (37.1 C) (Oral)   Resp 16   SpO2 97%  Gen:   Awake, no distress   Resp:  Normal effort  MSK:   Moves extremities without difficulty, erythema and edema to BLE. Other:    Medical Decision Making  Medically screening exam initiated at 7:53 PM.  Appropriate orders placed.  Jeremyah Gray was informed that the remainder of the evaluation will be completed by another provider, this initial triage assessment does not replace that evaluation, and the importance of remaining in the ED until their evaluation is complete.     Lorin Glass, PA-C 08/13/20 [name]    Charlesetta Shanks, MD 08/14/20 [phone]

## 2020-08-13 NOTE — Progress Notes (Signed)
Paramedicine Encounter    Patient ID: Charles Gray, male    DOB: 07-03-1955, 65 y.o.   MRN: [id number]   Patient Care Team: Enid Skeens., MD as PCP - General (Family Medicine) Berniece Salines, DO as PCP - Cardiology (Cardiology) Jorge Ny, LCSW as Social Worker (Licensed Clinical Social Worker)  Patient Active Problem List   Diagnosis Date Noted  . GERD without esophagitis 06/10/2020  . Atrial fibrillation with rapid ventricular response ([place]) 03/24/2020  . Acute on chronic right heart failure ([place]) 01/27/2020  . Acute respiratory failure with hypoxia ([place])   . Fatty liver disease, nonalcoholic   . Shock ([place])   . Personal history of DVT (deep vein thrombosis) 12/26/2019  . Chronic anticoagulation 12/26/2019  . Palpitations 12/26/2019  . AF (paroxysmal atrial fibrillation) ([place]) 12/23/2019  . Heart failure, systolic, acute ([place]) 83/15/1761  . Acute on chronic congestive heart failure ([place])   . Acute on chronic diastolic CHF (congestive heart failure) ([place]) 08/23/2019  . Redness and swelling of lower leg 08/23/2019  . Chest pain 08/23/2019  . Lactic acidosis 08/23/2019  . History of pulmonary embolism 08/12/2019  . Atrial tachycardia ([place]) 08/12/2019  . Occasional tremors 08/12/2019  . OSA (obstructive sleep apnea) 08/12/2019  . Acute on chronic right-sided heart failure ([place]) 08/06/2019  . Pulmonary embolism ([place]) 06/08/2019  . Normocytic anemia 06/08/2019  . Pulmonary emboli ([place]) 06/08/2019  . Chronic heart failure with preserved ejection fraction ([place]) 05/30/2019  . Class 2 severe obesity due to excess calories with serious comorbidity and body mass index (BMI) of 39.0 to 39.9 in adult ([place]) 05/30/2019  . Morbid obesity with BMI of 40.0-44.9, adult ([place]) 05/30/2019  . Hyponatremia   . Drug induced constipation   . Peripheral edema   . Hypotension due to drugs   . Hypoalbuminemia due to protein-calorie malnutrition ([place])   . Hypokalemia   . Thrombocytopenia  ([place])   . Anemia of chronic disease   . Cirrhosis of liver without ascites ([place])   . Chronic pain syndrome   . Debility 03/13/2019  . Portal hypertensive gastropathy ([place]) 03/06/2019  . Dyspnea   . GIB (gastrointestinal bleeding) 03/04/2019  . Mixed hyperlipidemia 01/24/2019  . Insomnia 01/24/2019  . Hyperlipidemia 01/24/2019  . Essential hypertension 01/24/2019  . Lumbar disc herniation 01/24/2019    Current Outpatient Medications:  .  acetaminophen (TYLENOL) 500 MG tablet, Take 1,000 mg by mouth every 6 (six) hours as needed for moderate pain or headache., Disp: , Rfl:  .  apixaban (ELIQUIS) 5 MG TABS tablet, Take 1 tablet (5 mg total) by mouth 2 (two) times daily., Disp: 60 tablet, Rfl: 0 .  Buprenorphine HCl-Naloxone HCl 8-2 MG FILM, Place 1 tablet under the tongue in the morning and at bedtime. , Disp: , Rfl:  .  cephALEXin (KEFLEX) 500 MG capsule, Take 500 mg by mouth 4 (four) times daily. X 10 days, Disp: , Rfl:  .  docusate sodium (COLACE) 100 MG capsule, Take 1 capsule (100 mg total) by mouth daily as needed for mild constipation or moderate constipation., Disp: 30 capsule, Rfl: 2 .  eplerenone (INSPRA) 50 MG tablet, Take 2 tablets (100 mg total) by mouth daily., Disp: 60 tablet, Rfl: 3 .  gabapentin (NEURONTIN) 800 MG tablet, Take 1 tablet (800 mg total) by mouth 3 (three) times daily., Disp: 90 tablet, Rfl: 1 .  metolazone (ZAROXOLYN) 2.5 MG tablet, Take 1 tablet (2.5 mg total) by mouth once a week. Every Friday, Disp: 90  tablet, Rfl: 0 .  metoprolol succinate (TOPROL XL) 25 MG 24 hr tablet, Take 1 tablet (25 mg total) by mouth 2 (two) times daily., Disp: 180 tablet, Rfl: 3 .  ondansetron (ZOFRAN ODT) 4 MG disintegrating tablet, Take 1 tablet (4 mg total) by mouth every 8 (eight) hours as needed for nausea or vomiting., Disp: 20 tablet, Rfl: 0 .  pantoprazole (PROTONIX) 40 MG tablet, Take 40 mg by mouth 2 (two) times daily., Disp: , Rfl:  .  potassium chloride SA (KLOR-CON) 20  MEQ tablet, Take 40 meq in AM and 20 meq in PM, Disp: 45 tablet, Rfl: 3 .  pravastatin (PRAVACHOL) 20 MG tablet, Take 1 tablet (20 mg total) by mouth daily., Disp: 30 tablet, Rfl: 1 .  senna (SENOKOT) 8.6 MG TABS tablet, Take 2 tablets (17.2 mg total) by mouth daily., Disp: 120 tablet, Rfl: 0 .  sertraline (ZOLOFT) 25 MG tablet, Take 1 tablet (25 mg total) by mouth daily., Disp: 30 tablet, Rfl: 0 .  sodium chloride (OCEAN) 0.65 % SOLN nasal spray, Place 1 spray into both nostrils as needed for congestion., Disp: , Rfl:  .  torsemide (DEMADEX) 20 MG tablet, Take 4 tablets (80 mg total) by mouth every morning AND 3 tablets (60 mg total) every evening., Disp: 210 tablet, Rfl: 11 .  VENTOLIN HFA 108 (90 Base) MCG/ACT inhaler, Inhale 1 puff into the lungs every 4 (four) hours as needed for shortness of breath., Disp: 6.7 g, Rfl: 1 .  zolpidem (AMBIEN) 10 MG tablet, Take 10 mg by mouth at bedtime as needed for sleep., Disp: , Rfl:  Allergies  Allergen Reactions  . Penicillins     Broke out in convulsions as a child  Did it involve swelling of the face/tongue/throat, SOB, or low BP? N/A Did it involve sudden or severe rash/hives, skin peeling, or any reaction on the inside of your mouth or nose? N/A Did you need to seek medical attention at a hospital or doctor's office?N/A When did it last happen?N/A If all above answers are "NO", may proceed with cephalosporin use.     Social History   Socioeconomic History  . Marital status: Single    Spouse name: Not on file  . Number of children: Not on file  . Years of education: Not on file  . Highest education level: Not on file  Occupational History  . Not on file  Tobacco Use  . Smoking status: Never Smoker  . Smokeless tobacco: Never Used  Vaping Use  . Vaping Use: Never used  Substance and Sexual Activity  . Alcohol use: Not Currently  . Drug use: Not Currently    Types: Marijuana    Comment: Smoked for 30 years  . Sexual activity: Not  on file  Other Topics Concern  . Not on file  Social History Narrative  . Not on file   Social Determinants of Health   Financial Resource Strain: Medium Risk  . Difficulty of Paying Living Expenses: Somewhat hard  Food Insecurity: No Food Insecurity  . Worried About Charity fundraiser in the Last Year: Never true  . Ran Out of Food in the Last Year: Never true  Transportation Needs: Unmet Transportation Needs  . Lack of Transportation (Medical): Yes  . Lack of Transportation (Non-Medical): No  Physical Activity: Not on file  Stress: Not on file  Social Connections: Not on file  Intimate Partner Violence: Not on file    Physical Exam Vitals reviewed.  Constitutional:      Appearance: Normal appearance. He is normal weight.  HENT:     Head: Normocephalic.     Nose: Nose normal.     Mouth/Throat:     Mouth: Mucous membranes are moist.     Pharynx: Oropharynx is clear.  Eyes:     Conjunctiva/sclera: Conjunctivae normal.     Pupils: Pupils are equal, round, and reactive to light.  Cardiovascular:     Rate and Rhythm: Normal rate and regular rhythm.     Pulses: Normal pulses.     Heart sounds: Normal heart sounds.  Pulmonary:     Effort: Pulmonary effort is normal.     Breath sounds: Normal breath sounds.  Abdominal:     General: There is distension.     Palpations: Abdomen is soft.     Tenderness: There is abdominal tenderness.  Musculoskeletal:        General: Swelling and tenderness present. Normal range of motion.     Cervical back: Normal range of motion.     Right lower leg: Edema present.     Left lower leg: Edema present.  Skin:    General: Skin is warm and dry.     Capillary Refill: Capillary refill takes less than 2 seconds.  Neurological:     General: No focal deficit present.     Mental Status: He is alert. Mental status is at baseline.  Psychiatric:        Mood and Affect: Mood normal.     Arrived for home visit for Charles Gray. Charles Gray elected not to go  to the ER yet as instructed by myself and the nurse at the HF clinic due to his symptoms of re-occuring cellulitis in both lower legs, syncope on 08/11/20 and increased fatigue with dark urine X3 days and dizziness X1 week. Charles Gray completed Keflex on Friday 4/29 and took a metolazone on Saturday 4/30. Charles Gray has had no relief of symptoms since completing antibiotic with his lower  leg swelling with discoloration, feverish to touch with pain/tenderness.   I obtained vitals and they are as noted.   Last weight- 364lbs Today weight- 368lbs  BP- 120/72 HR- 63 O2- 96%  Charles Gray has been compliant with his medications over the last week. I verified medications and confirmed same filling pill box accordingly.   Charles Gray was explained the importance of seeking further medical care due to the risk of infection with his ongoing symptoms. He stated he understood and says he will go to ER later today. I stressed the importance of reaching out to his PCP as well. Charles Gray expressed understanding. I plan to see Charles Gray in one week. Home visit complete.    -Information sheet left for EMS crew that comes out for Charles Gray for their report and for patient hand off to RN in ED.    Refills:  Potassium  Sertraline     Future Appointments  Date Time Provider [place]  09/10/2020 11:30 AM [facility] PA/NP [facility] None     ACTION: Home visit completed Next visit planned for one week

## 2020-08-13 NOTE — ED Provider Notes (Signed)
[facility] EMERGENCY DEPARTMENT Provider Note  CSN: [id number] Arrival date & time: 08/13/20 1803  Chief Complaint(s) Leg Swelling  HPI Charles Gray is a 65 y.o. male with extensive past medical history listed below including chronic heart failure on torsemide and metolazone, atrial fibrillation with prior history of PEs on chronic anticoagulation who presents to the emergency department with persistence cellulitis.  Patient is endorsing worsening pain in bilateral lower extremities.  Redness is worse on the right where he scraped himself a couple weeks ago.  Reports that he has a history of recurrent cellulitis.  Patient just finished a course of Keflex for cellulitis and reports that the redness worsen during the course of antibiotics.  Patient was compliant with medication. 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  They came out today and recommended he come to the hospital for IV antibiotics.   HPI  Past Medical History Past Medical History:  Diagnosis Date  . Acute on chronic congestive heart failure ([place])   . Acute on chronic diastolic CHF (congestive heart failure) ([place]) 08/23/2019  . Acute on chronic right-sided heart failure ([place]) 08/06/2019  . Acute respiratory failure with hypoxia ([place])   . Anemia of chronic disease   . Atrial fibrillation ([place]) 12/23/2019  . Atrial tachycardia ([place]) 08/12/2019  . Benign essential HTN   . Chest pain 08/23/2019  . Chronic anticoagulation 12/26/2019  . Chronic heart failure with preserved EF 05/30/2019   Echo 07/2019: EF 55-60, GR 1 DD  . Chronic pain syndrome   . Cirrhosis of liver without ascites ([place])   . Class 2 severe obesity due to excess calories with serious comorbidity and body mass index (BMI) of 39.0 to 39.9 in adult ([place]) 05/30/2019  . Debility 03/13/2019  . Drug induced constipation   . Dyspnea   . Fatty liver disease, nonalcoholic   . GIB (gastrointestinal bleeding) 03/04/2019  . Heart failure,  systolic, acute ([place]) 3/82/5053  . History of pulmonary embolism 08/12/2019  . Hyperlipidemia 01/24/2019  . Hypertension 01/24/2019  . Hypoalbuminemia due to protein-calorie malnutrition ([place])   . Hypokalemia   . Hyponatremia   . Hypotension due to drugs   . Insomnia 01/24/2019  . Lactic acidosis 08/23/2019  . Lumbar disc herniation 01/24/2019  . Mixed hyperlipidemia 01/24/2019  . Morbid obesity with BMI of 40.0-44.9, adult ([place]) 05/30/2019  . Normocytic anemia 06/08/2019  . Occasional tremors 08/12/2019  . OSA (obstructive sleep apnea) 08/12/2019  . Palpitations 12/26/2019  . Peripheral edema   . Personal history of DVT (deep vein thrombosis) 12/26/2019  . Portal hypertensive gastropathy ([place]) 03/06/2019   Seen on EGD 03/06/2019  . Pulmonary emboli ([place]) 06/08/2019  . Pulmonary embolism ([place]) 06/08/2019  . Redness and swelling of lower leg 08/23/2019  . Shock ([place])   . Thrombocytopenia [facility])    Patient Active Problem List   Diagnosis Date Noted  . GERD without esophagitis 06/10/2020  . Atrial fibrillation with rapid ventricular response ([place] 03/24/2020  . Acute on chronic right heart failure ([place]) 01/27/2020  . Acute respiratory failure with hypoxia ([place])   . Fatty liver disease, nonalcoholic   . Shock ([place])   . Personal history of DVT (deep vein thrombosis) 12/26/2019  . Chronic anticoagulation 12/26/2019  . Palpitations 12/26/2019  . AF (paroxysmal atrial fibrillation) ([place]) 12/23/2019  . Heart failure, systolic, acute ([place]) 97/67/3419  . Acute on chronic congestive heart failure ([place])   . Acute on chronic diastolic CHF (congestive heart failure) ([place])  08/23/2019  . Redness and swelling of lower leg 08/23/2019  . Chest pain 08/23/2019  . Lactic acidosis 08/23/2019  . History of pulmonary embolism 08/12/2019  . Atrial tachycardia ([place]) 08/12/2019  . Occasional tremors 08/12/2019  . OSA (obstructive sleep apnea) 08/12/2019  . Acute on chronic right-sided heart failure ([place])  08/06/2019  . Pulmonary embolism ([place]) 06/08/2019  . Normocytic anemia 06/08/2019  . Pulmonary emboli ([place]) 06/08/2019  . Chronic heart failure with preserved ejection fraction ([place]) 05/30/2019  . Class 2 severe obesity due to excess calories with serious comorbidity and body mass index (BMI) of 39.0 to 39.9 in adult ([place]) 05/30/2019  . Morbid obesity with BMI of 40.0-44.9, adult ([place]) 05/30/2019  . Hyponatremia   . Drug induced constipation   . Peripheral edema   . Hypotension due to drugs   . Hypoalbuminemia due to protein-calorie malnutrition ([employer])   . Hypokalemia   . Thrombocytopenia ([place])   . Anemia of chronic disease   . Cirrhosis of liver without ascites ([place])   . Chronic pain syndrome   . Debility 03/13/2019  . Portal hypertensive gastropathy ([place]) 03/06/2019  . Dyspnea   . GIB (gastrointestinal bleeding) 03/04/2019  . Mixed hyperlipidemia 01/24/2019  . Insomnia 01/24/2019  . Hyperlipidemia 01/24/2019  . Essential hypertension 01/24/2019  . Lumbar disc herniation 01/24/2019   Home Medication(s) Prior to Admission medications   Medication Sig Start Date End Date Taking? Authorizing Provider  acetaminophen (TYLENOL) 500 MG tablet Take 1,000 mg by mouth every 6 (six) hours as needed for moderate pain or headache.    [provider]  apixaban (ELIQUIS) 5 MG TABS tablet Take 1 tablet (5 mg total) by mouth 2 (two) times daily. 08/12/19   Strader, Fransisco Hertz, PA-C  Buprenorphine HCl-Naloxone HCl 8-2 MG FILM Place 1 tablet under the tongue in the morning and at bedtime.     [provider]  cephALEXin (KEFLEX) 500 MG capsule Take 500 mg by mouth 4 (four) times daily. X 10 days Patient not taking: Reported on 08/13/2020    [provider]  docusate sodium (COLACE) 100 MG capsule Take 1 capsule (100 mg total) by mouth daily as needed for mild constipation or moderate constipation. Patient not taking: Reported on 08/13/2020 07/02/20   [name] D, NP  eplerenone  (INSPRA) 50 MG tablet Take 2 tablets (100 mg total) by mouth daily. 06/25/20   Clegg, [name] D, NP  gabapentin (NEURONTIN) 800 MG tablet Take 1 tablet (800 mg total) by mouth 3 (three) times daily. 03/23/19   Angiulli, Lavon Paganini, PA-C  metolazone (ZAROXOLYN) 2.5 MG tablet Take 1 tablet (2.5 mg total) by mouth once a week. Every Friday 06/14/20   [place] Donnamarie Poag, DO  metoprolol succinate (TOPROL XL) 25 MG 24 hr tablet Take 1 tablet (25 mg total) by mouth 2 (two) times daily. 06/25/20 09/23/20  Larey Dresser, MD  ondansetron (ZOFRAN ODT) 4 MG disintegrating tablet Take 1 tablet (4 mg total) by mouth every 8 (eight) hours as needed for nausea or vomiting. 04/24/20   Larey Dresser, MD  pantoprazole (PROTONIX) 40 MG tablet Take 40 mg by mouth 2 (two) times daily.    [provider]  potassium chloride SA (KLOR-CON) 20 MEQ tablet Take 40 meq in AM and 20 meq in PM 07/02/20   Clegg, [name] D, NP  pravastatin (PRAVACHOL) 20 MG tablet Take 1 tablet (20 mg total) by mouth daily. 03/23/19   Angiulli, Lavon Paganini, PA-C  senna (  SENOKOT) 8.6 MG TABS tablet Take 2 tablets (17.2 mg total) by mouth daily. 06/13/20   [place] Donnamarie Poag, DO  sertraline (ZOLOFT) 25 MG tablet Take 1 tablet (25 mg total) by mouth daily. 06/25/20   Larey Dresser, MD  sodium chloride (OCEAN) 0.65 % SOLN nasal spray Place 1 spray into both nostrils as needed for congestion.    [provider]  torsemide (DEMADEX) 20 MG tablet Take 4 tablets (80 mg total) by mouth every morning AND 3 tablets (60 mg total) every evening. 07/02/20   Clegg, [name] D, NP  VENTOLIN HFA 108 (90 Base) MCG/ACT inhaler Inhale 1 puff into the lungs every 4 (four) hours as needed for shortness of breath. 03/23/19   Angiulli, Lavon Paganini, PA-C  zolpidem (AMBIEN) 10 MG tablet Take 10 mg by mouth at bedtime as needed for sleep. 05/19/19   [provider]  bumetanide (BUMEX) 1 MG tablet Take 4 tablets (4 mg total) by mouth daily with breakfast AND 3 tablets (3 mg total)  every evening. 05/10/20 06/13/20  Larey Dresser, MD  metoprolol tartrate (LOPRESSOR) 50 MG tablet Take 1 tablet (50 mg total) by mouth 2 (two) times daily. 02/09/20 04/11/20  Mercy Riding, MD  spironolactone (ALDACTONE) 100 MG tablet Take 1 tablet (100 mg total) by mouth daily. 02/10/20 02/27/20  Mercy Riding, MD                                                                                                                                    Past Surgical History Past Surgical History:  Procedure Laterality Date  . ESOPHAGOGASTRODUODENOSCOPY (EGD) WITH PROPOFOL N/A 03/05/2019   Procedure: ESOPHAGOGASTRODUODENOSCOPY (EGD) WITH PROPOFOL;  Surgeon: Doran Stabler, MD;  Location: [facility];  Service: Gastroenterology;  Laterality: N/A;  Large Blood Clot Removed  . ESOPHAGOGASTRODUODENOSCOPY (EGD) WITH PROPOFOL N/A 03/06/2019   Procedure: ESOPHAGOGASTRODUODENOSCOPY (EGD) WITH PROPOFOL;  Surgeon: Thornton Park, MD;  Location: [facility];  Service: Gastroenterology;  Laterality: N/A;  . HEMORROIDECTOMY    . RIGHT HEART CATH N/A 08/09/2019   Procedure: RIGHT HEART CATH;  Surgeon: Larey Dresser, MD;  Location: [facility];  Service: Cardiovascular;  Laterality: N/A;  . RIGHT/LEFT HEART CATH AND CORONARY ANGIOGRAPHY N/A 05/10/2020   Procedure: RIGHT/LEFT HEART CATH AND CORONARY ANGIOGRAPHY;  Surgeon: Larey Dresser, MD;  Location: [facility];  Service: Cardiovascular;  Laterality: N/A;   Family History Family History  Problem Relation Age of Onset  . Hyperlipidemia Mother   . Hypertension Mother   . Stroke Mother   . CAD Maternal Grandmother   . CAD Maternal Grandfather     Social History Social History   Tobacco Use  . Smoking status: Never Smoker  . Smokeless tobacco: Never Used  Vaping Use  . Vaping Use: Never used  Substance Use Topics  . Alcohol use: Not Currently  . Drug use:  Not Currently    Types: Marijuana    Comment: Smoked for 30 years    Allergies Penicillins  Review of Systems Review of Systems All other systems are reviewed and are negative for acute change except as noted in the HPI  Physical Exam Vital Signs  I have reviewed the triage vital signs BP 122/74 (BP Location: Left Arm)   Pulse (!) 59   Temp 98.3 F (36.8 C) (Oral)   Resp 16   SpO2 99%   Physical Exam Vitals reviewed.  Constitutional:      General: He is not in acute distress.    Appearance: He is well-developed. He is obese. He is not diaphoretic.  HENT:     Head: Normocephalic and atraumatic.     Jaw: No trismus.     Right Ear: External ear normal.     Left Ear: External ear normal.     Nose: Nose normal.  Eyes:     General: No scleral icterus.    Conjunctiva/sclera: Conjunctivae normal.  Neck:     Trachea: Phonation normal.  Pulmonary:     Effort: Pulmonary effort is normal. No respiratory distress.     Breath sounds: No stridor.  Abdominal:     General: There is no distension.  Musculoskeletal:        General: Normal range of motion.     Cervical back: Normal range of motion.     Right lower leg: 1+ Pitting Edema present.     Left lower leg: 1+ Pitting Edema present.  Skin:    Findings: Erythema (to BLE. R>L) present.       Neurological:     Mental Status: He is alert and oriented to person, place, and time.  Psychiatric:        Behavior: Behavior normal.     ED Results and Treatments Labs (all labs ordered are listed, but only abnormal results are displayed) Labs Reviewed  COMPREHENSIVE METABOLIC PANEL - Abnormal; Notable for the following components:      Result Value   Sodium 133 (*)    Potassium 3.1 (*)    Chloride 89 (*)    CO2 37 (*)    Glucose, Bld 107 (*)    Albumin 3.4 (*)    All other components within normal limits  LIPASE, BLOOD - Abnormal; Notable for the following components:   Lipase 52 (*)    All other components within normal limits  CBC WITH DIFFERENTIAL/PLATELET - Abnormal; Notable for the  following components:   Hemoglobin 11.7 (*)    HCT 38.6 (*)    MCV 79.8 (*)    MCH 24.2 (*)    All other components within normal limits  SARS CORONAVIRUS 2 (TAT 6-24 HRS)  BRAIN NATRIURETIC PEPTIDE  LACTIC ACID, PLASMA  URINALYSIS, ROUTINE W REFLEX MICROSCOPIC  CK  LACTIC ACID, PLASMA  EKG  EKG Interpretation  Date/Time:    Ventricular Rate:    PR Interval:    QRS Duration:   QT Interval:    QTC Calculation:   R Axis:     Text Interpretation:        Radiology No results found.  Pertinent labs & imaging results that were available during my care of the patient were reviewed by me and considered in my medical decision making (see chart for details).  Medications Ordered in ED Medications  clindamycin (CLEOCIN) IVPB 600 mg (has no administration in time range)  furosemide (LASIX) injection 60 mg (has no administration in time range)                                                                                                                                    Procedures Procedures  (including critical care time)  Medical Decision Making / ED Course I have reviewed the nursing notes for this encounter and the patient's prior records (if available in EHR or on provided paperwork).   Charles Gray was evaluated in Emergency Department on 08/14/2020 for the symptoms described in the history of present illness. He was evaluated in the context of the global COVID-19 pandemic, which necessitated consideration that the patient might be at risk for infection with the SARS-CoV-2 virus that causes COVID-19. Institutional protocols and algorithms that pertain to the evaluation of patients at risk for COVID-19 are in a state of rapid change based on information released by regulatory bodies including the CDC and federal and state organizations. These policies and  algorithms were followed during the patient's care in the ED.  Patient presents with bilateral lower extremity edema with erythema.  Consistent with cellulitis due to abrasion on the right.  No improvement with 10-day course of Keflex.  Will admit patient for IV antibiotics, given his failed outpatient management.      Final Clinical Impression(s) / ED Diagnoses Final diagnoses:  Cellulitis of right lower extremity  Cellulitis of left lower extremity      This chart was dictated using voice recognition software.  Despite best efforts to proofread,  errors can occur which can change the documentation meaning.   Fatima Blank, MD 08/14/20 [phone]

## 2020-08-13 NOTE — ED Triage Notes (Signed)
Pt reports bilat leg swelling and reoccurring cellulitis. Pt states he completed his antibiotics of keflex. Pt reports having a syncope episode over the weekend

## 2020-08-14 ENCOUNTER — Other Ambulatory Visit: Payer: Self-pay

## 2020-08-14 ENCOUNTER — Observation Stay (HOSPITAL_BASED_OUTPATIENT_CLINIC_OR_DEPARTMENT_OTHER): Payer: Medicare HMO

## 2020-08-14 ENCOUNTER — Observation Stay (HOSPITAL_COMMUNITY): Payer: Medicare HMO

## 2020-08-14 DIAGNOSIS — L03116 Cellulitis of left lower limb: Secondary | ICD-10-CM | POA: Diagnosis present

## 2020-08-14 DIAGNOSIS — D509 Iron deficiency anemia, unspecified: Secondary | ICD-10-CM | POA: Diagnosis present

## 2020-08-14 DIAGNOSIS — R55 Syncope and collapse: Secondary | ICD-10-CM | POA: Diagnosis not present

## 2020-08-14 DIAGNOSIS — L03115 Cellulitis of right lower limb: Secondary | ICD-10-CM | POA: Diagnosis not present

## 2020-08-14 LAB — LACTIC ACID, PLASMA: Lactic Acid, Venous: 1.7 mmol/L (ref 0.5–1.9)

## 2020-08-14 LAB — CBC
HCT: 36.5 % — ABNORMAL LOW (ref 39.0–52.0)
Hemoglobin: 11.1 g/dL — ABNORMAL LOW (ref 13.0–17.0)
MCH: 24.6 pg — ABNORMAL LOW (ref 26.0–34.0)
MCHC: 30.4 g/dL (ref 30.0–36.0)
MCV: 80.9 fL (ref 80.0–100.0)
Platelets: 143 10*3/uL — ABNORMAL LOW (ref 150–400)
RBC: 4.51 MIL/uL (ref 4.22–5.81)
RDW: 15.4 % (ref 11.5–15.5)
WBC: 8 10*3/uL (ref 4.0–10.5)
nRBC: 0 % (ref 0.0–0.2)

## 2020-08-14 LAB — COMPREHENSIVE METABOLIC PANEL
ALT: 23 U/L (ref 0–44)
AST: 37 U/L (ref 15–41)
Albumin: 3.1 g/dL — ABNORMAL LOW (ref 3.5–5.0)
Alkaline Phosphatase: 72 U/L (ref 38–126)
Anion gap: 10 (ref 5–15)
BUN: 21 mg/dL (ref 8–23)
CO2: 36 mmol/L — ABNORMAL HIGH (ref 22–32)
Calcium: 8.8 mg/dL — ABNORMAL LOW (ref 8.9–10.3)
Chloride: 89 mmol/L — ABNORMAL LOW (ref 98–111)
Creatinine, Ser: 1.07 mg/dL (ref 0.61–1.24)
GFR, Estimated: >60 mL/min (ref >60)
Glucose, Bld: 175 mg/dL — ABNORMAL HIGH (ref 70–99)
Potassium: 3.1 mmol/L — ABNORMAL LOW (ref 3.5–5.1)
Sodium: 135 mmol/L (ref 135–145)
Total Bilirubin: 0.9 mg/dL (ref 0.3–1.2)
Total Protein: 6.9 g/dL (ref 6.5–8.1)

## 2020-08-14 LAB — SARS CORONAVIRUS 2 (TAT 6-24 HRS): SARS Coronavirus 2: NEGATIVE

## 2020-08-14 MED ORDER — APIXABAN 5 MG PO TABS
5.0000 mg | ORAL_TABLET | Freq: Two times a day (BID) | ORAL | Status: DC
  Administered 2020-08-14 – 2020-08-21 (×14): 5 mg via ORAL
  Filled 2020-08-14 (×6): qty 1
  Filled 2020-08-14: qty 2
  Filled 2020-08-14 (×8): qty 1

## 2020-08-14 MED ORDER — SODIUM CHLORIDE 0.9 % IV SOLN
2.0000 g | INTRAVENOUS | Status: DC
  Administered 2020-08-14 – 2020-08-19 (×6): 2 g via INTRAVENOUS
  Filled 2020-08-14: qty 2
  Filled 2020-08-14: qty 20
  Filled 2020-08-14: qty 2
  Filled 2020-08-14 (×4): qty 20

## 2020-08-14 MED ORDER — TORSEMIDE 20 MG PO TABS
80.0000 mg | ORAL_TABLET | Freq: Every day | ORAL | Status: DC
  Administered 2020-08-14 – 2020-08-21 (×8): 80 mg via ORAL
  Filled 2020-08-14 (×9): qty 4

## 2020-08-14 MED ORDER — POTASSIUM CHLORIDE CRYS ER 20 MEQ PO TBCR
40.0000 meq | EXTENDED_RELEASE_TABLET | Freq: Two times a day (BID) | ORAL | Status: DC
  Administered 2020-08-14 – 2020-08-16 (×4): 40 meq via ORAL
  Filled 2020-08-14 (×4): qty 2

## 2020-08-14 MED ORDER — BUPRENORPHINE HCL-NALOXONE HCL 8-2 MG SL SUBL
1.0000 | SUBLINGUAL_TABLET | Freq: Two times a day (BID) | SUBLINGUAL | Status: DC
  Administered 2020-08-14 – 2020-08-21 (×15): 1 via SUBLINGUAL
  Filled 2020-08-14 (×15): qty 1

## 2020-08-14 MED ORDER — POTASSIUM CHLORIDE CRYS ER 20 MEQ PO TBCR
40.0000 meq | EXTENDED_RELEASE_TABLET | ORAL | Status: AC
  Administered 2020-08-14 (×2): 40 meq via ORAL
  Filled 2020-08-14 (×2): qty 2

## 2020-08-14 MED ORDER — ZOLPIDEM TARTRATE 5 MG PO TABS
10.0000 mg | ORAL_TABLET | Freq: Every day | ORAL | Status: DC
  Administered 2020-08-14 – 2020-08-20 (×7): 10 mg via ORAL
  Filled 2020-08-14 (×7): qty 2

## 2020-08-14 MED ORDER — PANTOPRAZOLE SODIUM 40 MG PO TBEC
40.0000 mg | DELAYED_RELEASE_TABLET | Freq: Two times a day (BID) | ORAL | Status: DC
  Administered 2020-08-14 – 2020-08-21 (×15): 40 mg via ORAL
  Filled 2020-08-14 (×15): qty 1

## 2020-08-14 MED ORDER — ACETAMINOPHEN 500 MG PO TABS: 1000.0000 mg | ORAL_TABLET | Freq: Four times a day (QID) | ORAL | Status: DC | PRN

## 2020-08-14 MED ORDER — METOPROLOL SUCCINATE ER 25 MG PO TB24
25.0000 mg | ORAL_TABLET | Freq: Two times a day (BID) | ORAL | Status: DC
  Administered 2020-08-14 – 2020-08-21 (×13): 25 mg via ORAL
  Filled 2020-08-14 (×15): qty 1

## 2020-08-14 MED ORDER — POLYETHYLENE GLYCOL 3350 17 G PO PACK
17.0000 g | PACK | Freq: Every day | ORAL | Status: DC | PRN
  Administered 2020-08-14 – 2020-08-15 (×2): 17 g via ORAL
  Filled 2020-08-14 (×2): qty 1

## 2020-08-14 MED ORDER — ACETAMINOPHEN 650 MG RE SUPP: 650.0000 mg | Freq: Four times a day (QID) | RECTAL | Status: DC | PRN

## 2020-08-14 MED ORDER — ACETAMINOPHEN 325 MG PO TABS: 650.0000 mg | ORAL_TABLET | Freq: Four times a day (QID) | ORAL | Status: DC | PRN

## 2020-08-14 MED ORDER — SERTRALINE HCL 25 MG PO TABS
12.5000 mg | ORAL_TABLET | Freq: Every day | ORAL | Status: DC
  Administered 2020-08-14 – 2020-08-21 (×8): 12.5 mg via ORAL
  Filled 2020-08-14 (×8): qty 1

## 2020-08-14 MED ORDER — TRAMADOL HCL 50 MG PO TABS
50.0000 mg | ORAL_TABLET | Freq: Four times a day (QID) | ORAL | Status: DC | PRN
  Administered 2020-08-15: 50 mg via ORAL
  Filled 2020-08-14 (×2): qty 1

## 2020-08-14 MED ORDER — PROCHLORPERAZINE EDISYLATE 10 MG/2ML IJ SOLN
10.0000 mg | Freq: Four times a day (QID) | INTRAMUSCULAR | Status: DC | PRN
  Administered 2020-08-17: 10 mg via INTRAVENOUS
  Filled 2020-08-14 (×2): qty 2

## 2020-08-14 MED ORDER — PRAVASTATIN SODIUM 40 MG PO TABS
20.0000 mg | ORAL_TABLET | Freq: Every day | ORAL | Status: DC
  Administered 2020-08-14 – 2020-08-20 (×7): 20 mg via ORAL
  Filled 2020-08-14 (×3): qty 2
  Filled 2020-08-14 (×4): qty 1

## 2020-08-14 MED ORDER — TORSEMIDE 20 MG PO TABS
60.0000 mg | ORAL_TABLET | Freq: Every day | ORAL | Status: DC
  Administered 2020-08-14 – 2020-08-20 (×7): 60 mg via ORAL
  Filled 2020-08-14 (×8): qty 3

## 2020-08-14 MED ORDER — GABAPENTIN 400 MG PO CAPS
800.0000 mg | ORAL_CAPSULE | Freq: Three times a day (TID) | ORAL | Status: DC
  Administered 2020-08-14 – 2020-08-21 (×23): 800 mg via ORAL
  Filled 2020-08-14 (×23): qty 2

## 2020-08-14 MED ORDER — METOLAZONE 2.5 MG PO TABS
2.5000 mg | ORAL_TABLET | ORAL | Status: DC
  Administered 2020-08-16: 2.5 mg via ORAL
  Filled 2020-08-14: qty 1

## 2020-08-14 MED ORDER — SPIRONOLACTONE 25 MG PO TABS
25.0000 mg | ORAL_TABLET | Freq: Every day | ORAL | Status: DC
  Administered 2020-08-14 – 2020-08-21 (×8): 25 mg via ORAL
  Filled 2020-08-14 (×8): qty 1

## 2020-08-14 MED ORDER — OXYCODONE-ACETAMINOPHEN 5-325 MG PO TABS
1.0000 | ORAL_TABLET | ORAL | Status: DC | PRN
  Administered 2020-08-14: 2 via ORAL
  Administered 2020-08-15: 1 via ORAL
  Administered 2020-08-16 – 2020-08-17 (×2): 2 via ORAL
  Filled 2020-08-14 (×2): qty 2
  Filled 2020-08-14: qty 1
  Filled 2020-08-14: qty 2

## 2020-08-14 MED ORDER — SENNA 8.6 MG PO TABS: 2.0000 | ORAL_TABLET | Freq: Every day | ORAL | Status: DC | PRN

## 2020-08-14 NOTE — Progress Notes (Signed)
Charles Gray is a 65 y.o. male with medical history significant off chronic diastolic heart failure, chronic right-sided heart failure, microcytic anemia, paroxysmal atrial fibrillation/atrial tachycardia, history of PE, history of DVT on Eliquis, chronic pain syndrome, cirrhosis of the liver without ascites, portal hypertensive gastropathy, thrombocytopenia, hyperlipidemia, hypertension, mild protein calorie malnutrition present hypoalbuminemia, chronic hypokalemia, lumbar disc herniation, occasional tremors, obstructive sleep apnea who is coming to the emergency department due to progressively worse bilateral lower extremities erythema for the past 2 weeks that did not respond to oral Keflex.    He was admitted earlier this am by Dr Olevia Bowens. Please see his note for detailed H&p.   Continue with IV antibiotics.    Hosie Poisson, MD

## 2020-08-14 NOTE — Progress Notes (Signed)
Carotid duplex bilateral study completed.  Preliminary results relayed to Karleen Hampshire, MD.  See CV Proc for preliminary results report.   Darlin Coco, RDMS, RVT

## 2020-08-14 NOTE — Progress Notes (Signed)
Mobility Specialist - Progress Note   08/14/20 1546  Mobility  Activity Ambulated in hall  Level of Assistance Standby assist, set-up cues, supervision of patient - no hands on  Assistive Device Front wheel walker  Distance Ambulated (ft) 90 ft  Mobility Response Tolerated well  Mobility performed by Mobility specialist  $Mobility charge 1 Mobility   Distance limited by fatigue and back pain. His HR remained in 70s throughout. Pt sitting up on edge of bed after walk.   Pricilla Handler Mobility Specialist Mobility Specialist Phone: [phone]

## 2020-08-14 NOTE — H&P (Signed)
History and Physical    Charles Gray QQI:[id number] DOB: 01/08/1956 DOA: 08/13/2020  PCP: Enid Skeens., MD   Patient coming from: Home.  I have personally briefly reviewed patient's old medical records in [place]  Chief Complaint: Bilateral leg swelling and redness.  HPI: Charles Gray is a 65 y.o. male with medical history significant off chronic diastolic heart failure, chronic right-sided heart failure, microcytic anemia, paroxysmal atrial fibrillation/atrial tachycardia, history of PE, history of DVT on Eliquis, chronic pain syndrome, cirrhosis of the liver without ascites, portal hypertensive gastropathy, thrombocytopenia, hyperlipidemia, hypertension, mild protein calorie malnutrition present hypoalbuminemia, chronic hypokalemia, lumbar disc herniation, occasional tremors, obstructive sleep apnea who is coming to the emergency department due to progressively worse bilateral lower extremities erythema for the past 2 weeks that did not respond to oral Keflex.  He denies fever, chills, but complains of having night sweats about 3 nights ago.  His appetite is decreased.  No renal area, sore throat, wheezing or hemoptysis.  Denies chest pain, palpitations, diaphoresis, PND, orthopnea, but has been lightheaded.  He believes he passed out briefly on Saturday.  He has chronic constipation, denies diarrhea, melena or hematochezia.  No dysuria, frequency or hematuria.  No polyuria, polydipsia, polyphagia or blurred vision.  ED Course: Initial vital signs were 98.3 F, pulse 90, respirations 16, BP 94/60 mmHg O2 sat 97% on room air.  Lab work: His urinalysis was normal.  CBC shows a white count 11.7 with an MCV of 79.8 fL and platelets 160.  Total CK, BNP and lactic acid x2 normal.  CMP shows 33, potassium 3.1, chloride 89 and CO2 37 mmol/L.  Renal function was normal.  Hepatic function are unremarkable, except for an albumin of 3.4 g/dL.  Glucose 107 mg/dL nonfasting.  Review of  Systems: As per HPI otherwise all other systems reviewed and are negative.  Past Medical History:  Diagnosis Date  . Acute on chronic congestive heart failure ([place])   . Acute on chronic diastolic CHF (congestive heart failure) ([place]) 08/23/2019  . Acute on chronic right-sided heart failure ([place]) 08/06/2019  . Acute respiratory failure with hypoxia ([place])   . Anemia of chronic disease   . Atrial fibrillation ([place]) 12/23/2019  . Atrial tachycardia ([place]) 08/12/2019  . Benign essential HTN   . Chest pain 08/23/2019  . Chronic anticoagulation 12/26/2019  . Chronic heart failure with preserved EF 05/30/2019   Echo 07/2019: EF 55-60, GR 1 DD  . Chronic pain syndrome   . Cirrhosis of liver without ascites ([place])   . Class 2 severe obesity due to excess calories with serious comorbidity and body mass index (BMI) of 39.0 to 39.9 in adult ([place]) 05/30/2019  . Debility 03/13/2019  . Drug induced constipation   . Dyspnea   . Fatty liver disease, nonalcoholic   . GIB (gastrointestinal bleeding) 03/04/2019  . Heart failure, systolic, acute ([place]) 9/41/7408  . History of pulmonary embolism 08/12/2019  . Hyperlipidemia 01/24/2019  . Hypertension 01/24/2019  . Hypoalbuminemia due to protein-calorie malnutrition ([place])   . Hypokalemia   . Hyponatremia   . Hypotension due to drugs   . Insomnia 01/24/2019  . Lactic acidosis 08/23/2019  . Lumbar disc herniation 01/24/2019  . Mixed hyperlipidemia 01/24/2019  . Morbid obesity with BMI of 40.0-44.9, adult ([place]) 05/30/2019  . Normocytic anemia 06/08/2019  . Occasional tremors 08/12/2019  . OSA (obstructive sleep apnea) 08/12/2019  . Palpitations 12/26/2019  . Peripheral edema   . Personal history of DVT (deep vein  thrombosis) 12/26/2019  . Portal hypertensive gastropathy ([place]) 03/06/2019   Seen on EGD 03/06/2019  . Pulmonary emboli ([place]) 06/08/2019  . Pulmonary embolism ([place]) 06/08/2019  . Redness and swelling of lower leg 08/23/2019  . Shock ([place])   . Thrombocytopenia  ([place])    Past Surgical History:  Procedure Laterality Date  . ESOPHAGOGASTRODUODENOSCOPY (EGD) WITH PROPOFOL N/A 03/05/2019   Procedure: ESOPHAGOGASTRODUODENOSCOPY (EGD) WITH PROPOFOL;  Surgeon: Doran Stabler, MD;  Location: [place];  Service: Gastroenterology;  Laterality: N/A;  Large Blood Clot Removed  . ESOPHAGOGASTRODUODENOSCOPY (EGD) WITH PROPOFOL N/A 03/06/2019   Procedure: ESOPHAGOGASTRODUODENOSCOPY (EGD) WITH PROPOFOL;  Surgeon: Thornton Park, MD;  Location: [place];  Service: Gastroenterology;  Laterality: N/A;  . HEMORROIDECTOMY    . RIGHT HEART CATH N/A 08/09/2019   Procedure: RIGHT HEART CATH;  Surgeon: Larey Dresser, MD;  Location: [place] [facility];  Service: Cardiovascular;  Laterality: N/A;  . RIGHT/LEFT HEART CATH AND CORONARY ANGIOGRAPHY N/A 05/10/2020   Procedure: RIGHT/LEFT HEART CATH AND CORONARY ANGIOGRAPHY;  Surgeon: Larey Dresser, MD;  Location: [place] [facility];  Service: Cardiovascular;  Laterality: N/A;   Social History  reports that he has never smoked. He has never used smokeless tobacco. He reports previous alcohol use. He reports previous drug use. Drug: Marijuana.  Allergies  Allergen Reactions  . Penicillins     Broke out in convulsions as a child  Did it involve swelling of the face/tongue/throat, SOB, or low BP? N/A Did it involve sudden or severe rash/hives, skin peeling, or any reaction on the inside of your mouth or nose? N/A Did you need to seek medical attention at a hospital or doctor's office?N/A When did it last happen?N/A If all above answers are "NO", may proceed with cephalosporin use.   Family History  Problem Relation Age of Onset  . Hyperlipidemia Mother   . Hypertension Mother   . Stroke Mother   . CAD Maternal Grandmother   . CAD Maternal Grandfather    Prior to Admission medications   Medication Sig Start Date End Date Taking? Authorizing Provider  acetaminophen (TYLENOL) 500 MG tablet Take 1,000 mg  by mouth every 6 (six) hours as needed for moderate pain or headache.   Yes [provider]  apixaban (ELIQUIS) 5 MG TABS tablet Take 1 tablet (5 mg total) by mouth 2 (two) times daily. 08/12/19  Yes Strader, [place] M, PA-C  Buprenorphine HCl-Naloxone HCl 8-2 MG FILM Place 1 tablet under the tongue in the morning and at bedtime.    Yes [provider]  eplerenone (INSPRA) 50 MG tablet Take 2 tablets (100 mg total) by mouth daily. 06/25/20  Yes Clegg, [name] D, NP  gabapentin (NEURONTIN) 800 MG tablet Take 1 tablet (800 mg total) by mouth 3 (three) times daily. 03/23/19  Yes Angiulli, Lavon Paganini, PA-C  metolazone (ZAROXOLYN) 2.5 MG tablet Take 1 tablet (2.5 mg total) by mouth once a week. Every Friday 06/14/20  Yes [place] Donnamarie Poag, DO  metoprolol succinate (TOPROL XL) 25 MG 24 hr tablet Take 1 tablet (25 mg total) by mouth 2 (two) times daily. 06/25/20 09/23/20 Yes Larey Dresser, MD  ondansetron (ZOFRAN ODT) 4 MG disintegrating tablet Take 1 tablet (4 mg total) by mouth every 8 (eight) hours as needed for nausea or vomiting. 04/24/20  Yes Larey Dresser, MD  pantoprazole (PROTONIX) 40 MG tablet Take 40 mg by mouth 2 (two) times daily.   Yes [provider]  polyethylene  glycol powder (GLYCOLAX/MIRALAX) 17 GM/SCOOP powder Take 17 g by mouth daily as needed for mild constipation.   Yes [provider]  potassium chloride SA (KLOR-CON) 20 MEQ tablet Take 40 meq in AM and 20 meq in PM 07/02/20  Yes Clegg, [name] D, NP  pravastatin (PRAVACHOL) 20 MG tablet Take 1 tablet (20 mg total) by mouth daily. 03/23/19  Yes Angiulli, Lavon Paganini, PA-C  senna (SENOKOT) 8.6 MG TABS tablet Take 2 tablets (17.2 mg total) by mouth daily. Patient taking differently: Take 2 tablets by mouth daily as needed for mild constipation. 06/13/20  Yes [place] Eric J, DO  sertraline (ZOLOFT) 25 MG tablet Take 1 tablet (25 mg total) by mouth daily. Patient taking differently: Take 12.5 mg by mouth daily. 06/25/20   Yes Larey Dresser, MD  sodium chloride (OCEAN) 0.65 % SOLN nasal spray Place 1 spray into both nostrils as needed for congestion.   Yes [provider]  torsemide (DEMADEX) 20 MG tablet Take 4 tablets (80 mg total) by mouth every morning AND 3 tablets (60 mg total) every evening. 07/02/20  Yes Clegg, [name] D, NP  VENTOLIN HFA 108 (90 Base) MCG/ACT inhaler Inhale 1 puff into the lungs every 4 (four) hours as needed for shortness of breath. 03/23/19  Yes Angiulli, Lavon Paganini, PA-C  zolpidem (AMBIEN) 10 MG tablet Take 10 mg by mouth at bedtime. 05/19/19  Yes [provider]  cephALEXin (KEFLEX) 500 MG capsule Take 500 mg by mouth 4 (four) times daily. X 10 days Patient not taking: No sig reported    [provider]  docusate sodium (COLACE) 100 MG capsule Take 1 capsule (100 mg total) by mouth daily as needed for mild constipation or moderate constipation. Patient not taking: No sig reported 07/02/20   Clegg, [name] D, NP  bumetanide (BUMEX) 1 MG tablet Take 4 tablets (4 mg total) by mouth daily with breakfast AND 3 tablets (3 mg total) every evening. 05/10/20 06/13/20  Larey Dresser, MD  metoprolol tartrate (LOPRESSOR) 50 MG tablet Take 1 tablet (50 mg total) by mouth 2 (two) times daily. 02/09/20 04/11/20  Mercy Riding, MD  spironolactone (ALDACTONE) 100 MG tablet Take 1 tablet (100 mg total) by mouth daily. 02/10/20 02/27/20  Mercy Riding, MD    Physical Exam: Vitals:   08/13/20 1938 08/13/20 2248 08/14/20 0015  BP: 94/68 122/74 104/60  Pulse: 90 (!) 59 (!) 58  Resp: 16 16 12   Temp: 98.7 F (37.1 C) 98.3 F (36.8 C)   TempSrc: Oral Oral   SpO2: 97% 99% 95%    Constitutional: NAD, calm, comfortable Eyes: PERRL, lids and conjunctivae normal ENMT: Mucous membranes are moist. Posterior pharynx clear of any exudate or lesions.  Neck: normal, supple, no masses, no thyromegaly Respiratory: Decreased breath sounds on bases, otherwise clear to auscultation bilaterally, no  wheezing, no crackles. Normal respiratory effort. No accessory muscle use.  Cardiovascular: Regular rate and rhythm, no murmurs / rubs / gallops. No extremity edema. 2+ pedal pulses. No carotid bruits.  Abdomen: Obese, no distention.  Bowel sounds positive.  Soft, no tenderness, no masses palpated. No hepatosplenomegaly. Musculoskeletal: Generalized weakness.  No clubbing / cyanosis.. Good ROM, no contractures. Normal muscle tone.  Skin: Bilateral edema, erythema, calor and TTP of the lower extremities. Neurologic: CN 2-12 grossly intact. Sensation intact, DTR normal. Strength 5/5 in all 4.  Psychiatric: Normal judgment and insight. Alert and oriented x 3. Normal mood.    Labs on Admission:  I have personally reviewed following labs and imaging studies  CBC: Recent Labs  Lab 08/13/20 2007  WBC 9.6  NEUTROABS 7.4  HGB 11.7*  HCT 38.6*  MCV 79.8*  PLT 419    Basic Metabolic Panel: Recent Labs  Lab 08/13/20 2007  NA 133*  K 3.1*  CL 89*  CO2 37*  GLUCOSE 107*  BUN 19  CREATININE 0.94  CALCIUM 9.0    GFR: Estimated Creatinine Clearance: 142.7 mL/min (by C-G formula based on SCr of 0.94 mg/dL).  Liver Function Tests: Recent Labs  Lab 08/13/20 2007  AST 38  ALT 22  ALKPHOS 85  BILITOT 0.5  PROT 7.4  ALBUMIN 3.4*   Urine analysis:    Component Value Date/Time   COLORURINE YELLOW 08/13/2020 1953   APPEARANCEUR CLEAR 08/13/2020 1953   LABSPEC 1.011 08/13/2020 1953   PHURINE 6.0 08/13/2020 1953   GLUCOSEU NEGATIVE 08/13/2020 1953   HGBUR NEGATIVE 08/13/2020 1953   BILIRUBINUR NEGATIVE 08/13/2020 1953   KETONESUR NEGATIVE 08/13/2020 1953   PROTEINUR NEGATIVE 08/13/2020 1953   NITRITE NEGATIVE 08/13/2020 1953   LEUKOCYTESUR NEGATIVE 08/13/2020 1953   03/25/2020 echocardiogram  IMPRESSIONS: 1. Left ventricular ejection fraction, by estimation, is 60 to 65%. The  left ventricle has normal function. The left ventricle has no regional  wall motion abnormalities.  Left ventricular diastolic parameters are  consistent with Grade II diastolic  dysfunction (pseudonormalization).  2. Right ventricular systolic function was not well visualized. The right  ventricular size is not well visualized.  3. The mitral valve was not well visualized.  4. The aortic valve was not well visualized. Aortic valve regurgitation  is not visualized. No aortic stenosis is present.  5. Aortic dilatation noted. There is mild dilatation of the aortic root,  measuring 38 mm.  6. Very poor echo images. LV marginally visualized, EF appears normal.  Radiological Exams on Admission: No results found.  EKG: Independently reviewed.   Assessment/Plan Principal Problem:   Cellulitis of both lower extremities Observation/telemetry. Continue ceftriaxone 2 g IVPB daily. Analgesics as needed. Monitor erythema area. Lifestyle modifications may help (weight loss). Follow-up with PCP as an outpatient.  Active Problems:   Mixed hyperlipidemia Continue pravastatin 20 mg p.o. daily.    Insomnia Continue Ambien 10 mg p.o. at bedtime.    Hypoalbuminemia due to protein-calorie malnutrition, mild (HCC) Protein supplementation. Consider nutritional services evaluation.    Hypokalemia Replacing. Check magnesium level. Follow-up potassium level.   History of pulmonary embolism Continue apixaban 5 mg p.o. twice daily.    OSA (obstructive sleep apnea) Does not use CPAP. Declined using CPAP while in the hospital.    AF (paroxysmal atrial fibrillation) (HCC) CHA?DS?-VASc Score of at least 3. Continue apixaban 5 mg p.o. twice daily. Continue metoprolol 25 mg p.o. twice daily.    Essential hypertension Continue metoprolol and torsemide. Monitor BP, HR, renal function electrolytes.    Grade 2 diastolic dysfunction No signs of decompensation. Continue metolazone, torsemide and beta-blocker.    Microcytic anemia Monitor H&H.    Class 2 severe obesity due to excess  calories with serious comorbidity and body mass index (BMI) of 39.0 to 39.9 in adult [facility]) Lifestyle modifications. Follow-up with PCP.    Syncopal episode? Passed out briefly on Saturday. Had echocardiogram in December last year. I will check carotid Doppler to further evaluate.     DVT prophylaxis: On apixaban. Code Status:   Full code. Family Communication: Disposition Plan:   Patient is from:  Home.  Anticipated  DC to:  Home.  Anticipated DC date:  08/15/2020.  Anticipated DC barriers: Clinical status.  Consults called:   Admission status:  Observation/telemetry.  Severity of Illness:  High severity due to bilateral lower extremity cellulitis that failed outpatient oral antibiotic therapy.  The patient will be admitted for IV antibiotic therapy.  Reubin Milan MD [facility]  How to contact the [facility] Attending or Consulting provider [place] or covering provider during after hours [place], for this patient?   1. Check the care team in [facility] and look for a) attending/consulting TRH provider listed and b) the [facility] team listed 2. Log into [email or web address] and use [place]'s universal password to access. If you do not have the password, please contact the hospital operator. 3. Locate the [facility] provider you are looking for under [facility] and page to a number that you can be directly reached. 4. If you still have difficulty reaching the provider, please page the [facility] (Director on Call) for the Hospitalists listed on amion for assistance.  08/14/2020, 12:57 AM   This document was prepared using Dragon voice recognition software and may contain some unintended transcription errors.

## 2020-08-15 DIAGNOSIS — G4733 Obstructive sleep apnea (adult) (pediatric): Secondary | ICD-10-CM | POA: Diagnosis present

## 2020-08-15 DIAGNOSIS — Z86711 Personal history of pulmonary embolism: Secondary | ICD-10-CM | POA: Diagnosis not present

## 2020-08-15 DIAGNOSIS — D638 Anemia in other chronic diseases classified elsewhere: Secondary | ICD-10-CM | POA: Diagnosis present

## 2020-08-15 DIAGNOSIS — I110 Hypertensive heart disease with heart failure: Secondary | ICD-10-CM | POA: Diagnosis present

## 2020-08-15 DIAGNOSIS — L03116 Cellulitis of left lower limb: Secondary | ICD-10-CM | POA: Diagnosis present

## 2020-08-15 DIAGNOSIS — Z6839 Body mass index (BMI) 39.0-39.9, adult: Secondary | ICD-10-CM | POA: Diagnosis not present

## 2020-08-15 DIAGNOSIS — K760 Fatty (change of) liver, not elsewhere classified: Secondary | ICD-10-CM | POA: Diagnosis present

## 2020-08-15 DIAGNOSIS — G894 Chronic pain syndrome: Secondary | ICD-10-CM | POA: Diagnosis present

## 2020-08-15 DIAGNOSIS — E8809 Other disorders of plasma-protein metabolism, not elsewhere classified: Secondary | ICD-10-CM | POA: Diagnosis present

## 2020-08-15 DIAGNOSIS — E782 Mixed hyperlipidemia: Secondary | ICD-10-CM | POA: Diagnosis present

## 2020-08-15 DIAGNOSIS — I480 Paroxysmal atrial fibrillation: Secondary | ICD-10-CM | POA: Diagnosis present

## 2020-08-15 DIAGNOSIS — D696 Thrombocytopenia, unspecified: Secondary | ICD-10-CM | POA: Diagnosis present

## 2020-08-15 DIAGNOSIS — I878 Other specified disorders of veins: Secondary | ICD-10-CM | POA: Diagnosis present

## 2020-08-15 DIAGNOSIS — D509 Iron deficiency anemia, unspecified: Secondary | ICD-10-CM | POA: Diagnosis present

## 2020-08-15 DIAGNOSIS — Z880 Allergy status to penicillin: Secondary | ICD-10-CM | POA: Diagnosis not present

## 2020-08-15 DIAGNOSIS — G4700 Insomnia, unspecified: Secondary | ICD-10-CM | POA: Diagnosis present

## 2020-08-15 DIAGNOSIS — L03115 Cellulitis of right lower limb: Secondary | ICD-10-CM | POA: Diagnosis present

## 2020-08-15 DIAGNOSIS — L0390 Cellulitis, unspecified: Secondary | ICD-10-CM | POA: Diagnosis present

## 2020-08-15 DIAGNOSIS — Z7901 Long term (current) use of anticoagulants: Secondary | ICD-10-CM | POA: Diagnosis not present

## 2020-08-15 DIAGNOSIS — E441 Mild protein-calorie malnutrition: Secondary | ICD-10-CM | POA: Diagnosis present

## 2020-08-15 DIAGNOSIS — I5032 Chronic diastolic (congestive) heart failure: Secondary | ICD-10-CM | POA: Diagnosis present

## 2020-08-15 DIAGNOSIS — Z20822 Contact with and (suspected) exposure to covid-19: Secondary | ICD-10-CM | POA: Diagnosis present

## 2020-08-15 DIAGNOSIS — I872 Venous insufficiency (chronic) (peripheral): Secondary | ICD-10-CM | POA: Diagnosis present

## 2020-08-15 DIAGNOSIS — E876 Hypokalemia: Secondary | ICD-10-CM | POA: Diagnosis present

## 2020-08-15 LAB — BASIC METABOLIC PANEL
Anion gap: 9 (ref 5–15)
BUN: 18 mg/dL (ref 8–23)
CO2: 34 mmol/L — ABNORMAL HIGH (ref 22–32)
Calcium: 8.8 mg/dL — ABNORMAL LOW (ref 8.9–10.3)
Chloride: 92 mmol/L — ABNORMAL LOW (ref 98–111)
Creatinine, Ser: 1 mg/dL (ref 0.61–1.24)
GFR, Estimated: >60 mL/min (ref >60)
Glucose, Bld: 151 mg/dL — ABNORMAL HIGH (ref 70–99)
Potassium: 3.4 mmol/L — ABNORMAL LOW (ref 3.5–5.1)
Sodium: 135 mmol/L (ref 135–145)

## 2020-08-15 LAB — TSH: TSH: 2.015 u[IU]/mL (ref 0.350–4.500)

## 2020-08-15 MED ORDER — POTASSIUM CHLORIDE CRYS ER 20 MEQ PO TBCR
40.0000 meq | EXTENDED_RELEASE_TABLET | Freq: Once | ORAL | Status: AC
  Administered 2020-08-15: 40 meq via ORAL
  Filled 2020-08-15: qty 2

## 2020-08-15 NOTE — Evaluation (Signed)
Clinical/Bedside Swallow Evaluation Patient Details  Name: Charles Gray MRN: [id number] Date of Birth: 04/04/1956  Today's Date: 08/15/2020 Time: SLP Start Time (ACUTE ONLY): 0903 SLP Stop Time (ACUTE ONLY): 0923 SLP Time Calculation (min) (ACUTE ONLY): 20 min  Past Medical History:  Past Medical History:  Diagnosis Date  . Acute on chronic congestive heart failure ([place])   . Acute on chronic diastolic CHF (congestive heart failure) ([place]) 08/23/2019  . Acute on chronic right-sided heart failure ([place]) 08/06/2019  . Acute respiratory failure with hypoxia ([place])   . Anemia of chronic disease   . Atrial fibrillation ([place]) 12/23/2019  . Atrial tachycardia ([place]) 08/12/2019  . Benign essential HTN   . Chest pain 08/23/2019  . Chronic anticoagulation 12/26/2019  . Chronic heart failure with preserved EF 05/30/2019   Echo 07/2019: EF 55-60, GR 1 DD  . Chronic pain syndrome   . Cirrhosis of liver without ascites ([place])   . Class 2 severe obesity due to excess calories with serious comorbidity and body mass index (BMI) of 39.0 to 39.9 in adult ([place]) 05/30/2019  . Debility 03/13/2019  . Drug induced constipation   . Dyspnea   . Fatty liver disease, nonalcoholic   . GIB (gastrointestinal bleeding) 03/04/2019  . Heart failure, systolic, acute ([place]) 2/50/5397  . History of pulmonary embolism 08/12/2019  . Hyperlipidemia 01/24/2019  . Hypertension 01/24/2019  . Hypoalbuminemia due to protein-calorie malnutrition ([place])   . Hypokalemia   . Hyponatremia   . Hypotension due to drugs   . Insomnia 01/24/2019  . Lactic acidosis 08/23/2019  . Lumbar disc herniation 01/24/2019  . Mixed hyperlipidemia 01/24/2019  . Morbid obesity with BMI of 40.0-44.9, adult ([place]) 05/30/2019  . Normocytic anemia 06/08/2019  . Occasional tremors 08/12/2019  . OSA (obstructive sleep apnea) 08/12/2019  . Palpitations 12/26/2019  . Peripheral edema   . Personal history of DVT (deep vein thrombosis) 12/26/2019  . Portal hypertensive  gastropathy ([place]) 03/06/2019   Seen on EGD 03/06/2019  . Pulmonary emboli ([place]) 06/08/2019  . Pulmonary embolism ([place]) 06/08/2019  . Redness and swelling of lower leg 08/23/2019  . Shock ([place])   . Thrombocytopenia ([place])    Past Surgical History:  Past Surgical History:  Procedure Laterality Date  . ESOPHAGOGASTRODUODENOSCOPY (EGD) WITH PROPOFOL N/A 03/05/2019   Procedure: ESOPHAGOGASTRODUODENOSCOPY (EGD) WITH PROPOFOL;  Surgeon: Doran Stabler, MD;  Location: [place];  Service: Gastroenterology;  Laterality: N/A;  Large Blood Clot Removed  . ESOPHAGOGASTRODUODENOSCOPY (EGD) WITH PROPOFOL N/A 03/06/2019   Procedure: ESOPHAGOGASTRODUODENOSCOPY (EGD) WITH PROPOFOL;  Surgeon: Thornton Park, MD;  Location: [place];  Service: Gastroenterology;  Laterality: N/A;  . HEMORROIDECTOMY    . RIGHT HEART CATH N/A 08/09/2019   Procedure: RIGHT HEART CATH;  Surgeon: Larey Dresser, MD;  Location: [place] [facility];  Service: Cardiovascular;  Laterality: N/A;  . RIGHT/LEFT HEART CATH AND CORONARY ANGIOGRAPHY N/A 05/10/2020   Procedure: RIGHT/LEFT HEART CATH AND CORONARY ANGIOGRAPHY;  Surgeon: Larey Dresser, MD;  Location: [place] CV [facility];  Service: Cardiovascular;  Laterality: N/A;   HPI:  Pt is a 65 yo male admitted with cellulitis of BLE. PMH also includes: CHF, chronic right-sided heart failure, microcytic anemia, paroxysmal afib/atrial tachycardia, h/o PE, h/o DVT on Eliquis, chronic pain syndrome, cirrhosis of the liver without ascites, portal hypertensive gastropathy, thrombocytopenia, HLD, HTN, mild protein calorie malnutrition present hypoalbuminemia, chronic hypokalemia, lumbar disc herniation, occasional tremors, OSA, self-reported GER. Previous BSE in 2020 [facility] but with recs for Dys  3 per pt preference.   Assessment / Plan / Recommendation Clinical Impression  Pt describes coughing that occurs while drinking thin liquids, with eructation that is associated "most" of the time.  He says that it has been happening more frequently at home compared to since he has been in the hospital, and during skilled observation today he primarily has throat clearing that is observed intermittently across all textures. Pt also reports being limited in what he can masticate given lack of dentition, but that he prefers to be able to make selections from the menu himself. Therefore, recommend continuing regular solids and thin liquids. SLP reviewed aspiration and esophageal precautions with pt and encouraged him to [facility] food thoroughly before swallowing. Given reported symptoms PTA, will f/u at least briefly.   SLP Visit Diagnosis: Dysphagia, unspecified (R13.10)    Aspiration Risk  Mild aspiration risk    Diet Recommendation Regular;Thin liquid   Liquid Administration via: Cup;Straw Medication Administration: Whole meds with liquid Supervision: Patient able to self feed;Intermittent supervision to cue for compensatory strategies Compensations: Slow rate;Small sips/bites;Follow solids with liquid Postural Changes: Seated upright at 90 degrees;Remain upright for at least 30 minutes after po intake    Other  Recommendations Oral Care Recommendations: Oral care BID   Follow up Recommendations  (tba)      Frequency and Duration min 2x/week  1 week       Prognosis Prognosis for Safe Diet Advancement: Good      Swallow Study   General HPI: Pt is a 65 yo male admitted with cellulitis of BLE. PMH also includes: CHF, chronic right-sided heart failure, microcytic anemia, paroxysmal afib/atrial tachycardia, h/o PE, h/o DVT on Eliquis, chronic pain syndrome, cirrhosis of the liver without ascites, portal hypertensive gastropathy, thrombocytopenia, HLD, HTN, mild protein calorie malnutrition present hypoalbuminemia, chronic hypokalemia, lumbar disc herniation, occasional tremors, OSA, self-reported GER. Previous BSE in 2020 [facility] but with recs for Dys 3 per pt preference. Type of Study:  Bedside Swallow Evaluation Previous Swallow Assessment: see HPI Diet Prior to this Study: Regular;Thin liquids Temperature Spikes Noted: No Respiratory Status: Room air History of Recent Intubation: No Behavior/Cognition: Alert;Cooperative;Pleasant mood Oral Cavity Assessment: Within Functional Limits Oral Care Completed by SLP: No Oral Cavity - Dentition: Edentulous Vision: Functional for self-feeding Self-Feeding Abilities: Able to feed self Patient Positioning: Other (comment) (EOB) Baseline Vocal Quality: Normal Volitional Cough: Strong Volitional Swallow: Able to elicit    Oral/Motor/Sensory Function Overall Oral Motor/Sensory Function: Within functional limits   Ice Chips Ice chips: Not tested   Thin Liquid Thin Liquid: Impaired Presentation: Cup;Self Fed;Straw Pharyngeal  Phase Impairments: Throat Clearing - Immediate;Throat Clearing - Delayed    Nectar Thick Nectar Thick Liquid: Not tested   Honey Thick Honey Thick Liquid: Not tested   Puree Puree: Within functional limits Presentation: Self Fed;Spoon   Solid     Solid: Impaired Presentation: Self Fed Pharyngeal Phase Impairments: Throat Clearing - Delayed      Osie Bond., M.A. [place] Pager [phone] Office [phone]  08/15/2020,9:34 AM

## 2020-08-15 NOTE — Progress Notes (Signed)
Mobility Specialist - Progress Note   08/15/20 1525  Mobility  Activity Ambulated in hall  Level of Assistance Standby assist, set-up cues, supervision of patient - no hands on  Assistive Device Front wheel walker  Distance Ambulated (ft) 100 ft  Mobility Response Tolerated well  Mobility performed by Mobility specialist  $Mobility charge 1 Mobility   Pt required one standing rest break due to fatigue in his arms, otherwise asx. VSS. Pt to recliner after walk, call bell at side.   Pricilla Handler Mobility Specialist Mobility Specialist Phone: [phone]

## 2020-08-15 NOTE — Progress Notes (Signed)
PROGRESS NOTE    Charles Gray  EPP:[id number] DOB: 1955/12/08 DOA: 08/13/2020 PCP: Enid Skeens., MD   Chief Complaint  Patient presents with  . Leg Swelling    Brief Narrative:  Charles Gray is a 65 y.o. male with medical history significant off chronic diastolic heart failure, chronic right-sided heart failure, microcytic anemia, paroxysmal atrial fibrillation/atrial tachycardia, history of PE, history of DVT on Eliquis, chronic pain syndrome, cirrhosis of the liver without ascites, portal hypertensive gastropathy, thrombocytopenia, hyperlipidemia, hypertension, mild protein calorie malnutrition present hypoalbuminemia, chronic hypokalemia, lumbar disc herniation, occasional tremors, obstructive sleep apnea who is coming to the emergency department due to progressively worse bilateral lower extremities erythema for the past 2 weeks that did not respond to oral Keflex.   Assessment & Plan:   Principal Problem:   Cellulitis of both lower extremities Active Problems:   Mixed hyperlipidemia   Insomnia   Hypoalbuminemia due to protein-calorie malnutrition (HCC)   Hypokalemia   Class 2 severe obesity due to excess calories with serious comorbidity and body mass index (BMI) of 39.0 to 39.9 in adult [facility]   History of pulmonary embolism   OSA (obstructive sleep apnea)   AF (paroxysmal atrial fibrillation) (HCC)   Essential hypertension   Microcytic anemia  Cellulitis of the lower extremities/ lymphadema dermatitis  Recommend another 48 hours of IV antibiotics.  Elevate the legs, pain control and continue to diurese.    Hypokalemia Replaced.   Pulmonary embolism Continue with Eliquis 5 mg twice daily.    Hyperlipidemia Continue with pravastatin.    Paroxysmal atrial fibrillation Rate control at this time and on Eliquis for anticoagulation.    Hypertension Continue with metoprolol and torsemide.    Syncopal episode 1 week ago Unclear etiology Carotid  duplex is negative.  Tele monitor does not show any arrhythmias.     Mild thrombocytopenia.  Continue to monitor.    DVT prophylaxis: Eliquis.  Code Status: FULL CODE.  Family Communication: none at bedside.  Disposition:   Status is: Observation  The patient will require care spanning > 2 midnights and should be moved to inpatient because: Ongoing diagnostic testing needed not appropriate for outpatient work up and IV treatments appropriate due to intensity of illness or inability to take PO  Dispo: The patient is from: Home              Anticipated d/c is to: Home              Patient currently is not medically stable to d/c.   Difficult to place patient No       Consultants:   None.   Procedures: (none.  Antimicrobials:  Antibiotics Given (last 72 hours)    Date/Time Action Medication Dose Rate   08/14/20 0044 New Bag/Given   clindamycin (CLEOCIN) IVPB 600 mg 600 mg 100 mL/hr   08/14/20 0119 New Bag/Given   cefTRIAXone (ROCEPHIN) 2 g in sodium chloride 0.9 % 100 mL IVPB 2 g 200 mL/hr   08/15/20 0146 New Bag/Given   cefTRIAXone (ROCEPHIN) 2 g in sodium chloride 0.9 % 100 mL IVPB 2 g 200 mL/hr          Subjective: No chest pain or sob. Leg pain persistent   Objective: Vitals:   08/14/20 2300 08/15/20 0059 08/15/20 0600 08/15/20 0759  BP:  (!) 110/58  139/79  Pulse:  75  76  Resp:  11 12 20   Temp: 98 F (36.7 C) 98 F (36.7 C)  98 F (  36.7 C)  TempSrc:  Oral  Oral  SpO2:  97%    Weight:   (!) 168 kg   Height:        Intake/Output Summary (Last 24 hours) at 08/15/2020 1229 Last data filed at 08/15/2020 2202 Gross per 24 hour  Intake 720 ml  Output --  Net 720 ml   Filed Weights   08/14/20 0300 08/15/20 0600  Weight: (!) 168.3 kg (!) 168 kg    Examination:  General exam: Appears calm and comfortable  Respiratory system: Clear to auscultation. Respiratory effort normal. Cardiovascular system: S1 & S2 heard, RRR.no murmer pedal edema present.   Gastrointestinal system: Abdomen is nondistended, soft and nontender. No organomegaly or masses felt. Normal bowel sounds heard. Central nervous system: Alert and oriented. No focal neurological deficits. Extremities: bilateral lower extremity edema present. Skin: erythema on the legs extend up to the knees. Tenderness present.  Psychiatry:  Mood & affect appropriate.     Data Reviewed: I have personally reviewed following labs and imaging studies  CBC: Recent Labs  Lab 08/13/20 2007 08/14/20 0202  WBC 9.6 8.0  NEUTROABS 7.4  --   HGB 11.7* 11.1*  HCT 38.6* 36.5*  MCV 79.8* 80.9  PLT 160 143*    Basic Metabolic Panel: Recent Labs  Lab 08/13/20 2007 08/14/20 0202 08/15/20 0849  NA 133* 135 135  K 3.1* 3.1* 3.4*  CL 89* 89* 92*  CO2 37* 36* 34*  GLUCOSE 107* 175* 151*  BUN 19 21 18   CREATININE 0.94 1.07 1.00  CALCIUM 9.0 8.8* 8.8*    GFR: Estimated Creatinine Clearance: 134.7 mL/min (by C-G formula based on SCr of 1 mg/dL).  Liver Function Tests: Recent Labs  Lab 08/13/20 2007 08/14/20 0202  AST 38 37  ALT 22 23  ALKPHOS 85 72  BILITOT 0.5 0.9  PROT 7.4 6.9  ALBUMIN 3.4* 3.1*    CBG: No results for input(s): GLUCAP in the last 168 hours.   Recent Results (from the past 240 hour(s))  SARS CORONAVIRUS 2 (TAT 6-24 HRS) Nasopharyngeal Nasopharyngeal Swab     Status: None   Collection Time: 08/14/20 12:19 AM   Specimen: Nasopharyngeal Swab  Result Value Ref Range Status   SARS Coronavirus 2 NEGATIVE NEGATIVE Final    Comment: (NOTE) SARS-CoV-2 target nucleic acids are NOT DETECTED.  The SARS-CoV-2 RNA is generally detectable in upper and lower respiratory specimens during the acute phase of infection. Negative results do not preclude SARS-CoV-2 infection, do not rule out co-infections with other pathogens, and should not be used as the sole basis for treatment or other patient management decisions. Negative results must be combined with clinical  observations, patient history, and epidemiological information. The expected result is Negative.  Fact Sheet for Patients: SugarRoll.be  Fact Sheet for Healthcare Providers: https://www.woods-mathews.com/  This test is not yet approved or cleared by the [place] FDA and  has been authorized for detection and/or diagnosis of SARS-CoV-2 by FDA under an Emergency Use Authorization (EUA). This EUA will remain  in effect (meaning this test can be used) for the duration of the COVID-19 declaration under Se ction 564(b)(1) of the Act, 21 U.S.C. section 360bbb-3(b)(1), unless the authorization is terminated or revoked sooner.  Performed at [facility], [place] [address], [place], [place] [address]          Radiology Studies: US THYROID  Result Date: 08/15/2020 CLINICAL DATA:  Other.  History of thyroid nodule. EXAM: THYROID ULTRASOUND TECHNIQUE: Ultrasound  examination of the thyroid gland and adjacent soft tissues was performed. COMPARISON:  None. FINDINGS: Parenchymal Echotexture: Mildly heterogenous Isthmus: 0.5 cm Right lobe: 4.7 x 2.1 x 2.2 cm Left lobe: 4.9 x 2.4 x 1.6 cm _________________________________________________________ Estimated total number of nodules >/= 1 cm: 3 Number of spongiform nodules >/=  2 cm not described below (TR1): 0 Number of mixed cystic and solid nodules >/= 1.5 cm not described below (TR2): 0 _________________________________________________________ Nodule # 1: Location: Right; Mid Maximum size: 1.8 cm; Other 2 dimensions: 1.3 x 1.6 cm Composition: spongiform (0) Echogenicity: anechoic (0) Shape: not taller-than-wide (0) Margins: lobulated/irregular (2) Echogenic foci: none (0) ACR TI-RADS total points: 2. ACR TI-RADS risk category: TR2 (2 points). ACR TI-RADS recommendations: This nodule does NOT meet TI-RADS criteria for biopsy or dedicated follow-up. _________________________________________________________  Nodule # 2: Location: Left; Mid Maximum size: 1.9 cm; Other 2 dimensions: 1.7 x 1.4 cm Composition: mixed cystic and solid (1) Echogenicity: hypoechoic (2) Shape: not taller-than-wide (0) Margins: ill-defined (0) Echogenic foci: none (0) ACR TI-RADS total points: 3. ACR TI-RADS risk category: TR3 (3 points). ACR TI-RADS recommendations: *Given size (>/= 1.5 - 2.4 cm) and appearance, a follow-up ultrasound in 1 year should be considered based on TI-RADS criteria. _________________________________________________________ Nodule # 3: Location: Left; Mid Maximum size: 1.4 cm; Other 2 dimensions: 1.4 x 1.2 cm Composition: solid/almost completely solid (2) Echogenicity: hyperechoic (1) Shape: not taller-than-wide (0) Margins: ill-defined (0) Echogenic foci: none (0) ACR TI-RADS total points: 3. ACR TI-RADS risk category: TR3 (3 points). ACR TI-RADS recommendations: Given size (<1.5 cm) and appearance, this nodule does NOT meet TI-RADS criteria for biopsy or dedicated follow-up. _________________________________________________________ IMPRESSION: 1. Mixed cystic and solid thyroid nodule in the left mid thyroid (labeled 2, 1.9 cm) meets criteria (TI-RADS category 3) for 1 year ultrasound surveillance. 2. The remaining bilateral thyroid nodules appear benign and do not warrant additional ultrasound follow-up or tissue sampling. The above is in keeping with the ACR TI-RADS recommendations - J Am Coll Radiol 2017;14:587-595. Ruthann Cancer, MD [facility] Radiology Electronically Signed   By: Ruthann Cancer MD   On: 08/15/2020 07:25   VAS US CAROTID  Result Date: 08/14/2020 Carotid Arterial Duplex Study Patient Name:  Charles Gray  Date of Exam:   08/14/2020 Medical Rec #: [id number]        Accession #:    [id number] Date of Birth: 08-Jun-1955        Patient Gender: M Patient Age:   16W Exam Location:  [facility] Procedure:      VAS US CAROTID Referring Phys: [id number]  [place] --------------------------------------------------------------------------------  Indications:       Syncope. Limitations        Today's exam was limited due to the body habitus of the                    patient. Comparison Study:  No prior studies. Performing Technologist: Darlin Coco RDMS,RVT  Examination Guidelines: A complete evaluation includes B-mode imaging, spectral Doppler, color Doppler, and power Doppler as needed of all accessible portions of each vessel. Bilateral testing is considered an integral part of a complete examination. Limited examinations for reoccurring indications may be performed as noted.  Right Carotid Findings: +----------+--------+--------+--------+------------------+--------+           PSV cm/sEDV cm/sStenosisPlaque DescriptionComments +----------+--------+--------+--------+------------------+--------+ CCA Prox  112     16                                         +----------+--------+--------+--------+------------------+--------+  CCA Distal109     20                                         +----------+--------+--------+--------+------------------+--------+ ICA Prox  78      17      1-39%   heterogenous               +----------+--------+--------+--------+------------------+--------+ ICA Distal67      26                                         +----------+--------+--------+--------+------------------+--------+ ECA       100                                                +----------+--------+--------+--------+------------------+--------+ +----------+--------+-------+----------------+-------------------+           PSV cm/sEDV cmsDescribe        Arm Pressure (mmHG) +----------+--------+-------+----------------+-------------------+ [id number]            Multiphasic, WNL                    +----------+--------+-------+----------------+-------------------+ +---------+--------+--+--------+--+---------+ VertebralPSV  cm/s57EDV cm/s22Antegrade +---------+--------+--+--------+--+---------+  Left Carotid Findings: +----------+-------+-------+--------+---------------------------------+--------+           PSV    EDV    StenosisPlaque Description               Comments           cm/s   cm/s                                                     +----------+-------+-------+--------+---------------------------------+--------+ CCA Prox  159    21                                                       +----------+-------+-------+--------+---------------------------------+--------+ CCA Distal119    27             smooth, heterogenous and                                                  hyperechoic                               +----------+-------+-------+--------+---------------------------------+--------+ ICA Prox  80     20     1-39%   smooth, heterogenous and                                                  hyperechoic                               +----------+-------+-------+--------+---------------------------------+--------+  ICA Distal76     32                                                       +----------+-------+-------+--------+---------------------------------+--------+ ECA       108                                                             +----------+-------+-------+--------+---------------------------------+--------+ +----------+--------+--------+----------------+-------------------+           PSV cm/sEDV cm/sDescribe        Arm Pressure (mmHG) +----------+--------+--------+----------------+-------------------+ [id number]             Multiphasic, WNL                    +----------+--------+--------+----------------+-------------------+ +---------+--------+--+--------+--+---------+ VertebralPSV cm/s58EDV cm/s18Antegrade +---------+--------+--+--------+--+---------+   Summary: Right Carotid: Velocities in the right ICA are consistent with  a 1-39% stenosis. Left Carotid: Velocities in the left ICA are consistent with a 1-39% stenosis. Incidental: Bilateral thyroid nodules. Right measuring 1.7 x 1.4 x 1.4 with extra thyroidal extension, microcalcifications, and mixed echogenicity. Left measuring 1.6 x 2.0 x 1.7 with extra thyroidal extension, hypoechoic mixed echogenicity. Vertebrals:  Bilateral vertebral arteries demonstrate antegrade flow. Subclavians: Normal flow hemodynamics were seen in bilateral subclavian              arteries.  *See table(s) above for measurements and observations.  Electronically signed by Jamelle Haring on 08/14/2020 at 5:04:49 PM.    Final         Scheduled Meds: . apixaban  5 mg Oral BID  . buprenorphine-naloxone  1 tablet Sublingual BID  . gabapentin  800 mg Oral TID  . [START ON 08/16/2020] metolazone  2.5 mg Oral Q Fri  . metoprolol succinate  25 mg Oral BID  . pantoprazole  40 mg Oral BID  . potassium chloride SA  40 mEq Oral BID  . potassium chloride  40 mEq Oral Once  . pravastatin  20 mg Oral q1800  . sertraline  12.5 mg Oral Daily  . spironolactone  25 mg Oral Daily  . torsemide  60 mg Oral q1800  . torsemide  80 mg Oral Daily  . zolpidem  10 mg Oral QHS   Continuous Infusions: . cefTRIAXone (ROCEPHIN)  IV 2 g (08/15/20 0146)     LOS: 0 days        Hosie Poisson, MD [facility]   To contact the attending provider between 7A-7P or the covering provider during after hours 7P-7A, please log into the web site [email or web address] and access using universal [place] password for that web site. If you do not have the password, please call the hospital operator.  08/15/2020, 12:29 PM

## 2020-08-16 LAB — BASIC METABOLIC PANEL
Anion gap: 8 (ref 5–15)
BUN: 17 mg/dL (ref 8–23)
CO2: 32 mmol/L (ref 22–32)
Calcium: 8.8 mg/dL — ABNORMAL LOW (ref 8.9–10.3)
Chloride: 94 mmol/L — ABNORMAL LOW (ref 98–111)
Creatinine, Ser: 0.97 mg/dL (ref 0.61–1.24)
GFR, Estimated: >60 mL/min (ref >60)
Glucose, Bld: 119 mg/dL — ABNORMAL HIGH (ref 70–99)
Potassium: 3.8 mmol/L (ref 3.5–5.1)
Sodium: 134 mmol/L — ABNORMAL LOW (ref 135–145)

## 2020-08-16 LAB — CBC WITH DIFFERENTIAL/PLATELET
Abs Immature Granulocytes: 0.06 10*3/uL (ref 0.00–0.07)
Basophils Absolute: 0 10*3/uL (ref 0.0–0.1)
Basophils Relative: 0 %
Eosinophils Absolute: 0.2 10*3/uL (ref 0.0–0.5)
Eosinophils Relative: 3 %
HCT: 37 % — ABNORMAL LOW (ref 39.0–52.0)
Hemoglobin: 11.2 g/dL — ABNORMAL LOW (ref 13.0–17.0)
Immature Granulocytes: 1 %
Lymphocytes Relative: 15 %
Lymphs Abs: 1.2 10*3/uL (ref 0.7–4.0)
MCH: 24.3 pg — ABNORMAL LOW (ref 26.0–34.0)
MCHC: 30.3 g/dL (ref 30.0–36.0)
MCV: 80.3 fL (ref 80.0–100.0)
Monocytes Absolute: 0.6 10*3/uL (ref 0.1–1.0)
Monocytes Relative: 8 %
Neutro Abs: 5.4 10*3/uL (ref 1.7–7.7)
Neutrophils Relative %: 73 %
Platelets: 171 10*3/uL (ref 150–400)
RBC: 4.61 MIL/uL (ref 4.22–5.81)
RDW: 15.7 % — ABNORMAL HIGH (ref 11.5–15.5)
WBC: 7.5 10*3/uL (ref 4.0–10.5)
nRBC: 0 % (ref 0.0–0.2)

## 2020-08-16 LAB — T4, FREE: Free T4: 1.04 ng/dL (ref 0.61–1.12)

## 2020-08-16 MED ORDER — POTASSIUM CHLORIDE CRYS ER 20 MEQ PO TBCR
40.0000 meq | EXTENDED_RELEASE_TABLET | Freq: Every day | ORAL | Status: DC
  Administered 2020-08-17: 40 meq via ORAL
  Filled 2020-08-16: qty 2

## 2020-08-16 NOTE — Progress Notes (Signed)
  Speech Language Pathology Treatment: Dysphagia  Patient Details Name: Charles Gray MRN: [id number] DOB: 06-24-1955 Today's Date: 08/16/2020 Time: 1660-6004 SLP Time Calculation (min) (ACUTE ONLY): 12 min  Assessment / Plan / Recommendation Clinical Impression  Pt was seen for swallowing, continuing to report no coughing with PO intake since he has been in the hospital. He consumed most of a cup of water and a package of graham crackers with no overt signs of aspiration or dysphagia. We reviewed general precautions, emphasizing smaller sips as needed since he thinks larger sips could be contributing, although he was not observed to have overt difficulties with larger sips today. SLP to sign off at this time after education has been reinforced. Will maintain regular solids and thin liquids. Please reorder with any acute difficulties.   HPI HPI: Pt is a 65 yo male admitted with cellulitis of BLE. PMH also includes: CHF, chronic right-sided heart failure, microcytic anemia, paroxysmal afib/atrial tachycardia, h/o PE, h/o DVT on Eliquis, chronic pain syndrome, cirrhosis of the liver without ascites, portal hypertensive gastropathy, thrombocytopenia, HLD, HTN, mild protein calorie malnutrition present hypoalbuminemia, chronic hypokalemia, lumbar disc herniation, occasional tremors, OSA, self-reported GER. Previous BSE in 2020 [facility] but with recs for Dys 3 per pt preference.      SLP Plan  All goals met       Recommendations  Diet recommendations: Regular;Thin liquid Liquids provided via: Cup;Straw Medication Administration: Whole meds with liquid Supervision: Patient able to self feed;Intermittent supervision to cue for compensatory strategies Compensations: Slow rate;Small sips/bites;Follow solids with liquid Postural Changes and/or Swallow Maneuvers: Seated upright 90 degrees                Oral Care Recommendations: Oral care BID Follow up Recommendations: None SLP Visit Diagnosis:  Dysphagia, unspecified (R13.10) Plan: All goals met       GO                Osie Bond., M.A. [place] [facility] Pager [phone] Office [phone]  08/16/2020, 11:47 AM

## 2020-08-16 NOTE — Progress Notes (Signed)
Pt complained of right hand pain. VS taken and is within normal limits. Pt stated pain is gone at this time. Will continue to monitor.

## 2020-08-16 NOTE — Progress Notes (Signed)
PROGRESS NOTE    Charles Gray  YQM:[id number] DOB: [birth year] DOA: 08/13/2020 PCP: Enid Skeens., MD   Chief Complaint  Patient presents with  . Leg Swelling    Brief Narrative:  Charles Gray is a 65 y.o. male with medical history significant off chronic diastolic heart failure, chronic right-sided heart failure, microcytic anemia, paroxysmal atrial fibrillation/atrial tachycardia, history of PE, history of DVT on Eliquis, chronic pain syndrome, cirrhosis of the liver without ascites, portal hypertensive gastropathy, thrombocytopenia, hyperlipidemia, hypertension, mild protein calorie malnutrition present hypoalbuminemia, chronic hypokalemia, lumbar disc herniation, occasional tremors, obstructive sleep apnea who is coming to the emergency department due to progressively worse bilateral lower extremities erythema for the past 2 weeks that did not respond to oral Keflex. Pt seen and examined atbedside.  He was started on IV rocephin and his cellulitis is improving with IV antibiotics and diuresis with torsemide and metolazone.   Assessment & Plan:   Principal Problem:   Cellulitis of both lower extremities Active Problems:   Mixed hyperlipidemia   Insomnia   Hypoalbuminemia due to protein-calorie malnutrition (HCC)   Hypokalemia   Class 2 severe obesity due to excess calories with serious comorbidity and body mass index (BMI) of 39.0 to 39.9 in adult [facility])   History of pulmonary embolism   OSA (obstructive sleep apnea)   AF (paroxysmal atrial fibrillation) (HCC)   Essential hypertension   Microcytic anemia   Cellulitis  Cellulitis of the lower extremities/ lymphadema dermatitis  Recommend another 48 hours of IV antibiotics.  Elevate the legs, pain control and continue to diurese with torsemide and metolazone.  Pain control with oxycodone.  The erythema and swelling of the lower extremities is improving.    Hypokalemia Replaced. Repeat level improving    Pulmonary  embolism Continue with Eliquis 5 mg twice daily.    Hyperlipidemia Continue with pravastatin.    Paroxysmal atrial fibrillation Rate control at this time and on Eliquis for anticoagulation.    Hypertension Well controlled.  Continue with metoprolol and torsemide.    Syncopal episode 1 week ago Unclear etiology Carotid duplex is negative.  Tele monitor does not show any arrhythmias.     Mild thrombocytopenia.  Continue to monitor.  Resolved.    Mild anemia of chronic disease:  Hemoglobin stable around 11.   Chronic diastolic heart failure:  Pt has bilateral leg edema . But he denies any sob.  Continue with torsemide and metolazone.    DVT prophylaxis: Eliquis.  Code Status: FULL CODE.  Family Communication: none at bedside.  Disposition:   Status is: Observation  The patient will require care spanning > 2 midnights and should be moved to inpatient because: Ongoing diagnostic testing needed not appropriate for outpatient work up and IV treatments appropriate due to intensity of illness or inability to take PO  Dispo: The patient is from: Home              Anticipated d/c is to: Home              Patient currently is not medically stable to d/c.   Difficult to place patient No       Consultants:   None.   Procedures: (none.  Antimicrobials:  Antibiotics Given (last 72 hours)    Date/Time Action Medication Dose Rate   08/14/20 0044 New Bag/Given   clindamycin (CLEOCIN) IVPB 600 mg 600 mg 100 mL/hr   08/14/20 0119 New Bag/Given   cefTRIAXone (ROCEPHIN) 2 g in sodium chloride 0.9 %  100 mL IVPB 2 g 200 mL/hr   08/15/20 0146 New Bag/Given   cefTRIAXone (ROCEPHIN) 2 g in sodium chloride 0.9 % 100 mL IVPB 2 g 200 mL/hr   08/16/20 0051 New Bag/Given   cefTRIAXone (ROCEPHIN) 2 g in sodium chloride 0.9 % 100 mL IVPB 2 g 200 mL/hr         Subjective: Leg edema is improving. No chest pain or sob.   Objective: Vitals:   08/16/20 0334 08/16/20 0753  08/16/20 1517 08/16/20 1606  BP: 122/66 99/83  120/75  Pulse: 68 66  73  Resp: 16 17  18   Temp: 98.1 F (36.7 C) 97.7 F (36.5 C) (!) 97 F (36.1 C) 98.3 F (36.8 C)  TempSrc: Oral Oral Axillary Oral  SpO2: 96% 98% 99% 95%  Weight: (!) 171 kg     Height:        Intake/Output Summary (Last 24 hours) at 08/16/2020 1630 Last data filed at 08/16/2020 1521 Gross per 24 hour  Intake 1405.15 ml  Output 1825 ml  Net -419.85 ml   Filed Weights   08/14/20 0300 08/15/20 0600 08/16/20 0334  Weight: (!) 168.3 kg (!) 168 kg (!) 171 kg    Examination:  General exam: elderly gentleman, not in distress.  Respiratory system: air entry fair, no wheezing heard.  Cardiovascular system: S1 & S2 heard, RRR, no JVD, pedal edema present. .  Gastrointestinal system: Abdomen is soft, nt nd bs+ Central nervous system: Alert and oriented, non focal.  Extremities: bilateral lower extremity edema present.  Skin: erythema and swelling of the lower extremities  improving.  Psychiatry:  Mood is appropriate.     Data Reviewed: I have personally reviewed following labs and imaging studies  CBC: Recent Labs  Lab 08/13/20 2007 08/14/20 0202 08/16/20 0208  WBC 9.6 8.0 7.5  NEUTROABS 7.4  --  5.4  HGB 11.7* 11.1* 11.2*  HCT 38.6* 36.5* 37.0*  MCV 79.8* 80.9 80.3  PLT 160 143* 109    Basic Metabolic Panel: Recent Labs  Lab 08/13/20 2007 08/14/20 0202 08/15/20 0849 08/16/20 0208  NA 133* 135 135 134*  K 3.1* 3.1* 3.4* 3.8  CL 89* 89* 92* 94*  CO2 37* 36* 34* 32  GLUCOSE 107* 175* 151* 119*  BUN 19 21 18 17   CREATININE 0.94 1.07 1.00 0.97  CALCIUM 9.0 8.8* 8.8* 8.8*    GFR: Estimated Creatinine Clearance: 140.2 mL/min (by C-G formula based on SCr of 0.97 mg/dL).  Liver Function Tests: Recent Labs  Lab 08/13/20 2007 08/14/20 0202  AST 38 37  ALT 22 23  ALKPHOS 85 72  BILITOT 0.5 0.9  PROT 7.4 6.9  ALBUMIN 3.4* 3.1*    CBG: No results for input(s): GLUCAP in the last 168  hours.   Recent Results (from the past 240 hour(s))  SARS CORONAVIRUS 2 (TAT 6-24 HRS) Nasopharyngeal Nasopharyngeal Swab     Status: None   Collection Time: 08/14/20 12:19 AM   Specimen: Nasopharyngeal Swab  Result Value Ref Range Status   SARS Coronavirus 2 NEGATIVE NEGATIVE Final    Comment: (NOTE) SARS-CoV-2 target nucleic acids are NOT DETECTED.  The SARS-CoV-2 RNA is generally detectable in upper and lower respiratory specimens during the acute phase of infection. Negative results do not preclude SARS-CoV-2 infection, do not rule out co-infections with other pathogens, and should not be used as the sole basis for treatment or other patient management decisions. Negative results must be combined with clinical observations, patient history,  and epidemiological information. The expected result is Negative.  Fact Sheet for Patients: SugarRoll.be  Fact Sheet for Healthcare Providers: https://www.woods-mathews.com/  This test is not yet approved or cleared by the [place] FDA and  has been authorized for detection and/or diagnosis of SARS-CoV-2 by FDA under an Emergency Use Authorization (EUA). This EUA will remain  in effect (meaning this test can be used) for the duration of the COVID-19 declaration under Se ction 564(b)(1) of the Act, 21 U.S.C. section 360bbb-3(b)(1), unless the authorization is terminated or revoked sooner.  Performed at [facility], [place] [address], [place],  [address]          Radiology Studies: US THYROID  Result Date: 08/15/2020 CLINICAL DATA:  Other.  History of thyroid nodule. EXAM: THYROID ULTRASOUND TECHNIQUE: Ultrasound examination of the thyroid gland and adjacent soft tissues was performed. COMPARISON:  None. FINDINGS: Parenchymal Echotexture: Mildly heterogenous Isthmus: 0.5 cm Right lobe: 4.7 x 2.1 x 2.2 cm Left lobe: 4.9 x 2.4 x 1.6 cm  _________________________________________________________ Estimated total number of nodules >/= 1 cm: 3 Number of spongiform nodules >/=  2 cm not described below (TR1): 0 Number of mixed cystic and solid nodules >/= 1.5 cm not described below (TR2): 0 _________________________________________________________ Nodule # 1: Location: Right; Mid Maximum size: 1.8 cm; Other 2 dimensions: 1.3 x 1.6 cm Composition: spongiform (0) Echogenicity: anechoic (0) Shape: not taller-than-wide (0) Margins: lobulated/irregular (2) Echogenic foci: none (0) ACR TI-RADS total points: 2. ACR TI-RADS risk category: TR2 (2 points). ACR TI-RADS recommendations: This nodule does NOT meet TI-RADS criteria for biopsy or dedicated follow-up. _________________________________________________________ Nodule # 2: Location: Left; Mid Maximum size: 1.9 cm; Other 2 dimensions: 1.7 x 1.4 cm Composition: mixed cystic and solid (1) Echogenicity: hypoechoic (2) Shape: not taller-than-wide (0) Margins: ill-defined (0) Echogenic foci: none (0) ACR TI-RADS total points: 3. ACR TI-RADS risk category: TR3 (3 points). ACR TI-RADS recommendations: *Given size (>/= 1.5 - 2.4 cm) and appearance, a follow-up ultrasound in 1 year should be considered based on TI-RADS criteria. _________________________________________________________ Nodule # 3: Location: Left; Mid Maximum size: 1.4 cm; Other 2 dimensions: 1.4 x 1.2 cm Composition: solid/almost completely solid (2) Echogenicity: hyperechoic (1) Shape: not taller-than-wide (0) Margins: ill-defined (0) Echogenic foci: none (0) ACR TI-RADS total points: 3. ACR TI-RADS risk category: TR3 (3 points). ACR TI-RADS recommendations: Given size (<1.5 cm) and appearance, this nodule does NOT meet TI-RADS criteria for biopsy or dedicated follow-up. _________________________________________________________ IMPRESSION: 1. Mixed cystic and solid thyroid nodule in the left mid thyroid (labeled 2, 1.9 cm) meets criteria (TI-RADS  category 3) for 1 year ultrasound surveillance. 2. The remaining bilateral thyroid nodules appear benign and do not warrant additional ultrasound follow-up or tissue sampling. The above is in keeping with the ACR TI-RADS recommendations - J Am Coll Radiol 2017;14:587-595. Ruthann Cancer, MD [facility] Radiology Electronically Signed   By: Ruthann Cancer MD   On: 08/15/2020 07:25        Scheduled Meds: . apixaban  5 mg Oral BID  . buprenorphine-naloxone  1 tablet Sublingual BID  . gabapentin  800 mg Oral TID  . metolazone  2.5 mg Oral Q Fri  . metoprolol succinate  25 mg Oral BID  . pantoprazole  40 mg Oral BID  . potassium chloride SA  40 mEq Oral BID  . pravastatin  20 mg Oral q1800  . sertraline  12.5 mg Oral Daily  . spironolactone  25 mg Oral  Daily  . torsemide  60 mg Oral q1800  . torsemide  80 mg Oral Daily  . zolpidem  10 mg Oral QHS   Continuous Infusions: . cefTRIAXone (ROCEPHIN)  IV Stopped (08/16/20 0124)     LOS: 1 day        Hosie Poisson, MD [facility]   To contact the attending provider between 7A-7P or the covering provider during after hours 7P-7A, please log into the web site [email or web address] and access using universal [place] password for that web site. If you do not have the password, please call the hospital operator.  08/16/2020, 4:30 PM

## 2020-08-16 NOTE — Discharge Instructions (Signed)
Information on my medicine - ELIQUIS (apixaban)  This medication education was reviewed with me or my healthcare representative as part of my discharge preparation. You were taking this medication prior to this hospital admission.   Why was Eliquis prescribed for you?  Continued from prior to admission medicattions. Eliquis was prescribed for you history of  blood clots that may have been found in the veins of your legs (deep vein thrombosis) or in your lungs (pulmonary embolism) and to reduce the risk of them occurring again.  What do You need to know about Eliquis ? The dose is take 5 mg tablet taken TWICE daily.  Eliquis may be taken with or without food.   Try to take the dose about the same time in the morning and in the evening. If you have difficulty swallowing the tablet whole please discuss with your pharmacist how to take the medication safely.  Take Eliquis exactly as prescribed and DO NOT stop taking Eliquis without talking to the doctor who prescribed the medication.  Stopping may increase your risk of developing a new blood clot.  Refill your prescription before you run out.  After discharge, you should have regular check-up appointments with your healthcare provider that is prescribing your Eliquis.    What do you do if you miss a dose? If a dose of ELIQUIS is not taken at the scheduled time, take it as soon as possible on the same day and twice-daily administration should be resumed. The dose should not be doubled to make up for a missed dose.  Important Safety Information A possible side effect of Eliquis is bleeding. You should call your healthcare provider right away if you experience any of the following: ? Bleeding from an injury or your nose that does not stop. ? Unusual colored urine (red or dark brown) or unusual colored stools (red or black). ? Unusual bruising for unknown reasons. ? A serious fall or if you hit your head (even if there is no  bleeding).  Some medicines may interact with Eliquis and might increase your risk of bleeding or clotting while on Eliquis. To help avoid this, consult your healthcare provider or pharmacist prior to using any new prescription or non-prescription medications, including herbals, vitamins, non-steroidal anti-inflammatory drugs (NSAIDs) and supplements.  This website has more information on Eliquis (apixaban): http://www.eliquis.com/eliquis/home

## 2020-08-16 NOTE — Progress Notes (Signed)
Pt transferred to 5N03, Tele box removed and returned, VSS prior to transport.   Chrisandra Carota, RN 08/16/2020 6:50 PM

## 2020-08-16 NOTE — Evaluation (Signed)
Physical Therapy Evaluation Patient Details Name: Charles Gray MRN: [id number] DOB: 05-31-1955 Today's Date: 08/16/2020   History of Present Illness  65 yo admitted 5/3 with bil LE cellulitis. PMHx: obesity, HFpEF, DVT/PE, cirrhosis, HLD, HTN, Afib, anemia, chronic pain, insomnia, CAD  Clinical Impression  Pt pleasant sitting in chair on arrival. Pt reports living alone and have aide 4 days/wk and that he only goes out of the apartment if aide or neighbor are nearby to supervise him on stairs. Pt with generally good technique with transfers and limited gait but will benefit from acute therapy to maximize gait including safety with stairs as pt declined attempting on eval. Encouraged continued mobility and gait acutely.     Follow Up Recommendations No PT follow up    Equipment Recommendations  None recommended by PT    Recommendations for Other Services       Precautions / Restrictions Precautions Precautions: Fall Restrictions Weight Bearing Restrictions: No      Mobility  Bed Mobility               General bed mobility comments: in chair on arrival and end of session    Transfers Overall transfer level: Modified independent                  Ambulation/Gait Ambulation/Gait assistance: Modified independent (Device/Increase time) Gait Distance (Feet): 200 Feet Assistive device: Rolling walker (2 wheeled) Gait Pattern/deviations: Step-through pattern;Trunk flexed   Gait velocity interpretation: <1.8 ft/sec, indicate of risk for recurrent falls General Gait Details: pt with trunk flexed due to RW too low in highest position and able to demonstrate appropriate posture briefly. gait limited by fatigue  Stairs Stairs:  (pt declined attempting)          Wheelchair Mobility    Modified Rankin (Stroke Patients Only)       Balance Overall balance assessment: Needs assistance Sitting-balance support: No upper extremity supported;Feet supported Sitting  balance-Leahy Scale: Good     Standing balance support: Bilateral upper extremity supported Standing balance-Leahy Scale: Poor                               Pertinent Vitals/Pain Pain Assessment: Faces Faces Pain Scale: Hurts little more Pain Location: legs Pain Descriptors / Indicators: Guarding;Sore Pain Intervention(s): Limited activity within patient's tolerance;Repositioned    Home Living Family/patient expects to be discharged to:: Private residence Living Arrangements: Alone Available Help at Discharge: Personal care attendant Type of Home: Apartment Home Access: Stairs to enter Entrance Stairs-Rails: Right;Left;Can reach both Entrance Stairs-Number of Steps: 14 Home Layout: One level Home Equipment: Tub bench;Walker - 2 wheels;Hand held shower head;Hospital bed      Prior Function Level of Independence: Needs assistance   Gait / Transfers Assistance Needed: using walker for gait  ADL's / Homemaking Assistance Needed: bathing and dressing assist 4x/wk, assist for homemaking, groceries delivered  Comments: Has HHRN 1x/wk     Hand Dominance        Extremity/Trunk Assessment   Upper Extremity Assessment Upper Extremity Assessment: Generalized weakness    Lower Extremity Assessment Lower Extremity Assessment: Generalized weakness    Cervical / Trunk Assessment Cervical / Trunk Assessment: Kyphotic  Communication   Communication: No difficulties  Cognition Arousal/Alertness: Awake/alert Behavior During Therapy: Flat affect Overall Cognitive Status: Within Functional Limits for tasks assessed  General Comments      Exercises     Assessment/Plan    PT Assessment Patient needs continued PT services  PT Problem List Decreased activity tolerance;Decreased balance;Decreased knowledge of use of DME;Decreased mobility       PT Treatment Interventions Gait training;Stair  training;Functional mobility training;Therapeutic activities;Patient/family education;Therapeutic exercise;DME instruction    PT Goals (Current goals can be found in the Care Plan section)  Acute Rehab PT Goals Patient Stated Goal: return home PT Goal Formulation: With patient Time For Goal Achievement: 08/23/20 Potential to Achieve Goals: Good    Frequency Min 3X/week   Barriers to discharge Decreased caregiver support      Co-evaluation               AM-PAC PT "6 Clicks" Mobility  Outcome Measure Help needed turning from your back to your side while in a flat bed without using bedrails?: A Little Help needed moving from lying on your back to sitting on the side of a flat bed without using bedrails?: A Little Help needed moving to and from a bed to a chair (including a wheelchair)?: A Little Help needed standing up from a chair using your arms (e.g., wheelchair or bedside chair)?: None Help needed to walk in hospital room?: A Little Help needed climbing 3-5 steps with a railing? : A Little 6 Click Score: 19    End of Session Equipment Utilized During Treatment: Gait belt Activity Tolerance: Patient tolerated treatment well Patient left: in chair;with call bell/phone within reach;with nursing/sitter in room Nurse Communication: Mobility status PT Visit Diagnosis: Other abnormalities of gait and mobility (R26.89);Difficulty in walking, not elsewhere classified (R26.2)    Time: 6286-3817 PT Time Calculation (min) (ACUTE ONLY): 23 min   Charges:   PT Evaluation $PT Eval Moderate Complexity: 1 Mod          Latiqua Daloia P, PT Acute Rehabilitation Services Pager: [phone] Office: [place] Shanya Ferriss 08/16/2020, 12:21 PM

## 2020-08-17 LAB — MAGNESIUM: Magnesium: 2.1 mg/dL (ref 1.7–2.4)

## 2020-08-17 LAB — BASIC METABOLIC PANEL
Anion gap: 14 (ref 5–15)
BUN: 18 mg/dL (ref 8–23)
CO2: 33 mmol/L — ABNORMAL HIGH (ref 22–32)
Calcium: 8.8 mg/dL — ABNORMAL LOW (ref 8.9–10.3)
Chloride: 83 mmol/L — ABNORMAL LOW (ref 98–111)
Creatinine, Ser: 1.05 mg/dL (ref 0.61–1.24)
GFR, Estimated: >60 mL/min (ref >60)
Glucose, Bld: 115 mg/dL — ABNORMAL HIGH (ref 70–99)
Potassium: 2.2 mmol/L — CL (ref 3.5–5.1)
Sodium: 130 mmol/L — ABNORMAL LOW (ref 135–145)

## 2020-08-17 LAB — PSA, TOTAL AND FREE
PSA, Free Pct: 5 %
PSA, Free: 0.01 ng/mL
Prostate Specific Ag, Serum: 0.2 ng/mL (ref 0.0–4.0)

## 2020-08-17 LAB — T3, FREE: T3, Free: 3.2 pg/mL (ref 2.0–4.4)

## 2020-08-17 LAB — POTASSIUM: Potassium: 2.4 mmol/L — CL (ref 3.5–5.1)

## 2020-08-17 MED ORDER — POTASSIUM CHLORIDE 10 MEQ/100ML IV SOLN
10.0000 meq | INTRAVENOUS | Status: AC
  Administered 2020-08-18 (×2): 10 meq via INTRAVENOUS
  Filled 2020-08-17 (×2): qty 100

## 2020-08-17 MED ORDER — VANCOMYCIN HCL 2000 MG/400ML IV SOLN
2000.0000 mg | Freq: Two times a day (BID) | INTRAVENOUS | Status: DC
  Administered 2020-08-17 – 2020-08-18 (×2): 2000 mg via INTRAVENOUS
  Filled 2020-08-17 (×2): qty 400

## 2020-08-17 MED ORDER — POTASSIUM CHLORIDE 10 MEQ/100ML IV SOLN: 10.0000 meq | INTRAVENOUS | Status: DC

## 2020-08-17 MED ORDER — POTASSIUM CHLORIDE 10 MEQ/100ML IV SOLN
10.0000 meq | INTRAVENOUS | Status: AC
  Administered 2020-08-17 (×2): 10 meq via INTRAVENOUS
  Filled 2020-08-17 (×2): qty 100

## 2020-08-17 MED ORDER — POTASSIUM CHLORIDE CRYS ER 20 MEQ PO TBCR
40.0000 meq | EXTENDED_RELEASE_TABLET | Freq: Three times a day (TID) | ORAL | Status: DC
  Administered 2020-08-17 (×2): 40 meq via ORAL
  Filled 2020-08-17 (×2): qty 2

## 2020-08-17 MED ORDER — POTASSIUM CHLORIDE CRYS ER 20 MEQ PO TBCR: 40.0000 meq | EXTENDED_RELEASE_TABLET | Freq: Two times a day (BID) | ORAL | Status: DC

## 2020-08-17 NOTE — Progress Notes (Signed)
PROGRESS NOTE    Charles Gray  ZHG:[id number] DOB: 1955-05-27 DOA: 08/13/2020 PCP: Enid Skeens., MD   Chief Complaint  Patient presents with  . Leg Swelling    Brief Narrative:  Charles Gray is a 65 y.o. male with medical history significant off chronic diastolic heart failure, chronic right-sided heart failure, microcytic anemia, paroxysmal atrial fibrillation/atrial tachycardia, history of PE, history of DVT on Eliquis, chronic pain syndrome, cirrhosis of the liver without ascites, portal hypertensive gastropathy, thrombocytopenia, hyperlipidemia, hypertension, mild protein calorie malnutrition present hypoalbuminemia, chronic hypokalemia, lumbar disc herniation, occasional tremors, obstructive sleep apnea who is coming to the emergency department due to progressively worse bilateral lower extremities erythema for the past 2 weeks that did not respond to oral Keflex. Pt seen and examined atbedside.  Pt reports he has worsening pain in the right lower leg and cellulitis. Will add vancomycin.   Assessment & Plan:   Principal Problem:   Cellulitis of both lower extremities Active Problems:   Mixed hyperlipidemia   Insomnia   Hypoalbuminemia due to protein-calorie malnutrition (HCC)   Hypokalemia   Class 2 severe obesity due to excess calories with serious comorbidity and body mass index (BMI) of 39.0 to 39.9 in adult [facility])   History of pulmonary embolism   OSA (obstructive sleep apnea)   AF (paroxysmal atrial fibrillation) (HCC)   Essential hypertension   Microcytic anemia   Cellulitis  Cellulitis of the lower extremities/ lymphadema dermatitis  Despite IV rocephin, pts; lower extremity cellulitis didn't resolve, will add vancomycin to the regimen.  Elevate the legs, pain control and continue to diurese with torsemide and metolazone.  Pain control with oxycodone.     Hypokalemia Replaced. Check magnesium levels.  Recheck potassium at 8 pm today and in am.    Pulmonary embolism Continue with Eliquis 5 mg twice daily.    Hyperlipidemia Continue with pravastatin.    Paroxysmal atrial fibrillation Rate control at this time and on Eliquis for anticoagulation.    Hypertension Well controlled.  Continue with metoprolol and torsemide.    Syncopal episode 1 week ago Unclear etiology Carotid duplex is negative.  Tele monitor does not show any arrhythmias.     Mild thrombocytopenia.  Continue to monitor.  Resolved.    Mild anemia of chronic disease:  Hemoglobin stable around 11.   Chronic diastolic heart failure:  Pt has bilateral leg edema . But he denies any sob.  Continue with torsemide and metolazone.    DVT prophylaxis: Eliquis.  Code Status: FULL CODE.  Family Communication: none at bedside.  Disposition:   Status is: Observation  The patient will require care spanning > 2 midnights and should be moved to inpatient because: Ongoing diagnostic testing needed not appropriate for outpatient work up and IV treatments appropriate due to intensity of illness or inability to take PO  Dispo: The patient is from: Home              Anticipated d/c is to: Home              Patient currently is not medically stable to d/c.   Difficult to place patient No       Consultants:   None.   Procedures: (none.  Antimicrobials:  Antibiotics Given (last 72 hours)    Date/Time Action Medication Dose Rate   08/15/20 0146 New Bag/Given   cefTRIAXone (ROCEPHIN) 2 g in sodium chloride 0.9 % 100 mL IVPB 2 g 200 mL/hr   08/16/20 0051 New Bag/Given  cefTRIAXone (ROCEPHIN) 2 g in sodium chloride 0.9 % 100 mL IVPB 2 g 200 mL/hr   08/17/20 0122 New Bag/Given   cefTRIAXone (ROCEPHIN) 2 g in sodium chloride 0.9 % 100 mL IVPB 2 g 200 mL/hr         Subjective: Leg edema improving, but right lower extremity cellulitis not improving.  Small blebs on the RLL.   Objective: Vitals:   08/16/20 1940 08/17/20 0258 08/17/20 0735  08/17/20 1007  BP: 117/64 120/66 122/69 123/64  Pulse: 83 74 81 85  Resp:  17 18 20   Temp: 98.3 F (36.8 C) 99.5 F (37.5 C) 97.8 F (36.6 C) 98.5 F (36.9 C)  TempSrc: Oral Oral Oral Oral  SpO2: 94% 95% 95% 96%  Weight:      Height:        Intake/Output Summary (Last 24 hours) at 08/17/2020 1430 Last data filed at 08/17/2020 0900 Gross per 24 hour  Intake 1240 ml  Output 1400 ml  Net -160 ml   Filed Weights   08/14/20 0300 08/15/20 0600 08/16/20 0334  Weight: (!) 168.3 kg (!) 168 kg (!) 171 kg    Examination:  General exam: alert and comfortable, not in distress.  Respiratory system: air entry fair , no wheezing or rhonchi.   Cardiovascular system: S1 & S2 heard, RRR, pedal edema present Gastrointestinal system: Abdomen is soft, non tender non distended bowel sounds wnl.  Central nervous system: Alert and oriented nonfocal  Extremities: bilateral lower extremity edema present.  Skin: persistent erythema of the lower extremities.  Psychiatry:  Mood is appropriate.     Data Reviewed: I have personally reviewed following labs and imaging studies  CBC: Recent Labs  Lab 08/13/20 2007 08/14/20 0202 08/16/20 0208  WBC 9.6 8.0 7.5  NEUTROABS 7.4  --  5.4  HGB 11.7* 11.1* 11.2*  HCT 38.6* 36.5* 37.0*  MCV 79.8* 80.9 80.3  PLT 160 143* 154    Basic Metabolic Panel: Recent Labs  Lab 08/13/20 2007 08/14/20 0202 08/15/20 0849 08/16/20 0208 08/17/20 1057  NA 133* 135 135 134* 130*  K 3.1* 3.1* 3.4* 3.8 2.2*  CL 89* 89* 92* 94* 83*  CO2 37* 36* 34* 32 33*  GLUCOSE 107* 175* 151* 119* 115*  BUN 19 21 18 17 18   CREATININE 0.94 1.07 1.00 0.97 1.05  CALCIUM 9.0 8.8* 8.8* 8.8* 8.8*    GFR: Estimated Creatinine Clearance: 129.5 mL/min (by C-G formula based on SCr of 1.05 mg/dL).  Liver Function Tests: Recent Labs  Lab 08/13/20 2007 08/14/20 0202  AST 38 37  ALT 22 23  ALKPHOS 85 72  BILITOT 0.5 0.9  PROT 7.4 6.9  ALBUMIN 3.4* 3.1*    CBG: No results  for input(s): GLUCAP in the last 168 hours.   Recent Results (from the past 240 hour(s))  SARS CORONAVIRUS 2 (TAT 6-24 HRS) Nasopharyngeal Nasopharyngeal Swab     Status: None   Collection Time: 08/14/20 12:19 AM   Specimen: Nasopharyngeal Swab  Result Value Ref Range Status   SARS Coronavirus 2 NEGATIVE NEGATIVE Final    Comment: (NOTE) SARS-CoV-2 target nucleic acids are NOT DETECTED.  The SARS-CoV-2 RNA is generally detectable in upper and lower respiratory specimens during the acute phase of infection. Negative results do not preclude SARS-CoV-2 infection, do not rule out co-infections with other pathogens, and should not be used as the sole basis for treatment or other patient management decisions. Negative results must be combined with clinical observations, patient history,  and epidemiological information. The expected result is Negative.  Fact Sheet for Patients: SugarRoll.be  Fact Sheet for Healthcare Providers: https://www.woods-mathews.com/  This test is not yet approved or cleared by the [place] FDA and  has been authorized for detection and/or diagnosis of SARS-CoV-2 by FDA under an Emergency Use Authorization (EUA). This EUA will remain  in effect (meaning this test can be used) for the duration of the COVID-19 declaration under Se ction 564(b)(1) of the Act, 21 U.S.C. section 360bbb-3(b)(1), unless the authorization is terminated or revoked sooner.  Performed at [facility], [place] [address], [place], [place] [address]          Radiology Studies: No results found.      Scheduled Meds: . apixaban  5 mg Oral BID  . buprenorphine-naloxone  1 tablet Sublingual BID  . gabapentin  800 mg Oral TID  . metolazone  2.5 mg Oral Q Fri  . metoprolol succinate  25 mg Oral BID  . pantoprazole  40 mg Oral BID  . potassium chloride  40 mEq Oral TID  . pravastatin  20 mg Oral q1800  . sertraline  12.5 mg Oral  Daily  . spironolactone  25 mg Oral Daily  . torsemide  60 mg Oral q1800  . torsemide  80 mg Oral Daily  . zolpidem  10 mg Oral QHS   Continuous Infusions: . cefTRIAXone (ROCEPHIN)  IV 2 g (08/17/20 0122)  . potassium chloride 10 mEq (08/17/20 1334)     LOS: 2 days        Hosie Poisson, MD [facility]   To contact the attending provider between 7A-7P or the covering provider during after hours 7P-7A, please log into the web site [email or web address] and access using universal [place] password for that web site. If you do not have the password, please call the hospital operator.  08/17/2020, 2:30 PM

## 2020-08-17 NOTE — Progress Notes (Signed)
Pharmacy Antibiotic Note  Charles Gray is a 65 y.o. male admitted on 08/13/2020 with cellulitis. Pharmacy has been consulted for vancomycin dosing as cellulitis has not improved. Cr stable ~1.  Plan: Vancomycin 2074m IV q12h - est AUC 529 Follow Cr  Height: 6' 10"  (208.3 cm) Weight: (!) 171 kg (376 lb 14.4 oz) IBW/kg (Calculated) : 100.6  Temp (24hrs), Avg:98.5 F (36.9 C), Min:97.8 F (36.6 C), Max:99.5 F (37.5 C)  Recent Labs  Lab 08/13/20 2007 08/13/20 2350 08/14/20 0202 08/15/20 0849 08/16/20 0208 08/17/20 1057  WBC 9.6  --  8.0  --  7.5  --   CREATININE 0.94  --  1.07 1.00 0.97 1.05  LATICACIDVEN 1.8 1.7  --   --   --   --     Estimated Creatinine Clearance: 129.5 mL/min (by C-G formula based on SCr of 1.05 mg/dL).    Allergies  Allergen Reactions  . Penicillins     Broke out in convulsions as a child  Did it involve swelling of the face/tongue/throat, SOB, or low BP? N/A Did it involve sudden or severe rash/hives, skin peeling, or any reaction on the inside of your mouth or nose? N/A Did you need to seek medical attention at a hospital or doctor's office?N/A When did it last happen?N/A If all above answers are "NO", may proceed with cephalosporin use.    MArrie Senate PharmD, BCPS, BNorth Coast Endoscopy IncClinical Pharmacist [phone] check AMION for all M[place]numbers 08/17/2020

## 2020-08-17 NOTE — Progress Notes (Signed)
Critical lab value: Potassium 2.4 Lab just called.  Patient's nurse informed.

## 2020-08-18 LAB — BASIC METABOLIC PANEL
Anion gap: 12 (ref 5–15)
BUN: 17 mg/dL (ref 8–23)
CO2: 34 mmol/L — ABNORMAL HIGH (ref 22–32)
Calcium: 8.8 mg/dL — ABNORMAL LOW (ref 8.9–10.3)
Chloride: 86 mmol/L — ABNORMAL LOW (ref 98–111)
Creatinine, Ser: 0.98 mg/dL (ref 0.61–1.24)
GFR, Estimated: >60 mL/min (ref >60)
Glucose, Bld: 122 mg/dL — ABNORMAL HIGH (ref 70–99)
Potassium: 2.5 mmol/L — CL (ref 3.5–5.1)
Sodium: 132 mmol/L — ABNORMAL LOW (ref 135–145)

## 2020-08-18 LAB — POTASSIUM: Potassium: 3 mmol/L — ABNORMAL LOW (ref 3.5–5.1)

## 2020-08-18 LAB — MAGNESIUM: Magnesium: 2.2 mg/dL (ref 1.7–2.4)

## 2020-08-18 MED ORDER — POTASSIUM CHLORIDE 10 MEQ/100ML IV SOLN
10.0000 meq | INTRAVENOUS | Status: AC
  Administered 2020-08-18 (×2): 10 meq via INTRAVENOUS
  Filled 2020-08-18 (×2): qty 100

## 2020-08-18 MED ORDER — VANCOMYCIN HCL 1250 MG/250ML IV SOLN
1250.0000 mg | Freq: Two times a day (BID) | INTRAVENOUS | Status: DC
  Administered 2020-08-18 – 2020-08-21 (×6): 1250 mg via INTRAVENOUS
  Filled 2020-08-18 (×7): qty 250

## 2020-08-18 MED ORDER — POTASSIUM CHLORIDE 10 MEQ/100ML IV SOLN
10.0000 meq | INTRAVENOUS | Status: AC
  Administered 2020-08-18: 10 meq via INTRAVENOUS
  Filled 2020-08-18: qty 100

## 2020-08-18 MED ORDER — POTASSIUM CHLORIDE CRYS ER 20 MEQ PO TBCR
40.0000 meq | EXTENDED_RELEASE_TABLET | Freq: Three times a day (TID) | ORAL | Status: DC
  Administered 2020-08-18 – 2020-08-21 (×10): 40 meq via ORAL
  Filled 2020-08-18 (×10): qty 2

## 2020-08-18 NOTE — Progress Notes (Signed)
Pharmacy Antibiotic Note  Charles Gray is a 65 y.o. male admitted on 08/13/2020 with cellulitis. Pharmacy has been consulted for vancomycin dosing as cellulitis has not improved. Cr stable ~1.  Plan: Vancomycin1272m IV q12h - est AUC 428 w/ est Vd 0.5 L/kg (BMI > 30) and Scr ~1. Follow Cr  Height: 6' 10"  (208.3 cm) Weight: (!) 171 kg (376 lb 14.4 oz) IBW/kg (Calculated) : 100.6  Temp (24hrs), Avg:98.2 F (36.8 C), Min:98 F (36.7 C), Max:98.5 F (36.9 C)  Recent Labs  Lab 08/13/20 2007 08/13/20 2350 08/14/20 0202 08/15/20 0849 08/16/20 0208 08/17/20 1057 08/18/20 0217  WBC 9.6  --  8.0  --  7.5  --   --   CREATININE 0.94  --  1.07 1.00 0.97 1.05 0.98  LATICACIDVEN 1.8 1.7  --   --   --   --   --     Estimated Creatinine Clearance: 138.7 mL/min (by C-G formula based on SCr of 0.98 mg/dL).    Allergies  Allergen Reactions  . Penicillins     Broke out in convulsions as a child  Did it involve swelling of the face/tongue/throat, SOB, or low BP? N/A Did it involve sudden or severe rash/hives, skin peeling, or any reaction on the inside of your mouth or nose? N/A Did you need to seek medical attention at a hospital or doctor's office?N/A When did it last happen?N/A If all above answers are "NO", may proceed with cephalosporin use.    AAlfonse Spruce PharmD PGY2 ID Pharmacy Resident Phone between 7 am - 3:30 pm: [phone] Please check AMION for all [place]phone numbers After 10:00 PM, call ML[phone] 08/18/2020

## 2020-08-18 NOTE — Plan of Care (Signed)

## 2020-08-18 NOTE — Progress Notes (Signed)
Pt refused to stay flat on bed.Pt encouraged and educated.

## 2020-08-18 NOTE — Progress Notes (Signed)
PROGRESS NOTE    Charles Gray  ZJQ:[id number] DOB: 1955-07-11 DOA: 08/13/2020 PCP: Enid Skeens., MD   Chief Complaint  Patient presents with  . Leg Swelling    Brief Narrative:  Charles Gray is a 65 y.o. male with medical history significant off chronic diastolic heart failure, chronic right-sided heart failure, microcytic anemia, paroxysmal atrial fibrillation/atrial tachycardia, history of PE, history of DVT on Eliquis, chronic pain syndrome, cirrhosis of the liver without ascites, portal hypertensive gastropathy, thrombocytopenia, hyperlipidemia, hypertension, mild protein calorie malnutrition present hypoalbuminemia, chronic hypokalemia, lumbar disc herniation, occasional tremors, obstructive sleep apnea who is coming to the emergency department due to progressively worse bilateral lower extremities erythema for the past 2 weeks that did not respond to oral Keflex. Pt seen and examined at bedside. Added vancomycin.    Assessment & Plan:   Principal Problem:   Cellulitis of both lower extremities Active Problems:   Mixed hyperlipidemia   Insomnia   Hypoalbuminemia due to protein-calorie malnutrition (HCC)   Hypokalemia   Class 2 severe obesity due to excess calories with serious comorbidity and body mass index (BMI) of 39.0 to 39.9 in adult [facility])   History of pulmonary embolism   OSA (obstructive sleep apnea)   AF (paroxysmal atrial fibrillation) (HCC)   Essential hypertension   Microcytic anemia   Cellulitis  Cellulitis of the lower extremities/ lymphadema dermatitis  Despite IV rocephin, pts; lower extremity cellulitis didn't resolve, will add vancomycin to the regimen.  Improving cellulitis.  Elevate the legs, pain control and continue to diurese with torsemide and metolazone.  Pain control with oxycodone.   Severe Hypokalemia:  Replaced.   Pulmonary embolism Continue with Eliquis 5 mg twice daily.   Hyperlipidemia Continue with  pravastatin.   Paroxysmal atrial fibrillation Rate control at this time and on Eliquis for anticoagulation.    Hypertension BP parameters are well controlled.  Continue with metoprolol and torsemide.    Syncopal episode 1 week ago Unclear etiology Carotid duplex is negative.  Tele monitor does not show any arrhythmias.   Mild thrombocytopenia.  Continue to monitor.  Resolved.    Mild anemia of chronic disease:  Hemoglobin stable around 11.   Chronic diastolic heart failure:  Pt has bilateral leg edema . But he denies any sob.  Continue with torsemide and metolazone.    DVT prophylaxis: Eliquis.  Code Status: FULL CODE.  Family Communication: none at bedside.  Disposition:   Status is: Observation  The patient will require care spanning > 2 midnights and should be moved to inpatient because: Ongoing diagnostic testing needed not appropriate for outpatient work up and IV treatments appropriate due to intensity of illness or inability to take PO  Dispo: The patient is from: Home              Anticipated d/c is to: Home              Patient currently is not medically stable to d/c.   Difficult to place patient No       Consultants:   None.   Procedures: (none.  Antimicrobials:  Antibiotics Given (last 72 hours)    Date/Time Action Medication Dose Rate   08/16/20 0051 New Bag/Given   cefTRIAXone (ROCEPHIN) 2 g in sodium chloride 0.9 % 100 mL IVPB 2 g 200 mL/hr   08/17/20 0122 New Bag/Given   cefTRIAXone (ROCEPHIN) 2 g in sodium chloride 0.9 % 100 mL IVPB 2 g 200 mL/hr   08/17/20 1702 New Bag/Given  vancomycin (VANCOREADY) IVPB 2000 mg/400 mL 2,000 mg 200 mL/hr   08/18/20 0157 New Bag/Given   cefTRIAXone (ROCEPHIN) 2 g in sodium chloride 0.9 % 100 mL IVPB 2 g 200 mL/hr   08/18/20 0512 New Bag/Given   vancomycin (VANCOREADY) IVPB 2000 mg/400 mL 2,000 mg 200 mL/hr   08/18/20 1747 New Bag/Given   vancomycin (VANCOREADY) IVPB 1250 mg/250 mL 1,250 mg 166.7  mL/hr         Subjective: Improving cellulitis.   Objective: Vitals:   08/17/20 2203 08/18/20 0352 08/18/20 0947 08/18/20 1420  BP: 118/75 (!) 127/96 111/75 113/72  Pulse: 64 85 78 69  Resp:  16 17 16   Temp:  98 F (36.7 C) 98.2 F (36.8 C) 98.5 F (36.9 C)  TempSrc:  Oral Oral Oral  SpO2:  97% 100% 95%  Weight:      Height:        Intake/Output Summary (Last 24 hours) at 08/18/2020 1808 Last data filed at 08/18/2020 1324 Gross per 24 hour  Intake 1991.98 ml  Output --  Net 1991.98 ml   Filed Weights   08/14/20 0300 08/15/20 0600 08/16/20 0334  Weight: (!) 168.3 kg (!) 168 kg (!) 171 kg    Examination:  General exam: alert and oriented.  Respiratory system: clear to auscultation, no wheezing heard.  Cardiovascular system: S1S2, RRR, no JVD.  Gastrointestinal system: Abdomen is soft, NT ND BS+ Central nervous system: ALERT and oriented, non focal  Extremities: leg edema improving.  Skin: improving cellulitis  Psychiatry:  Mood is appropriate.      Data Reviewed: I have personally reviewed following labs and imaging studies  CBC: Recent Labs  Lab 08/13/20 2007 08/14/20 0202 08/16/20 0208  WBC 9.6 8.0 7.5  NEUTROABS 7.4  --  5.4  HGB 11.7* 11.1* 11.2*  HCT 38.6* 36.5* 37.0*  MCV 79.8* 80.9 80.3  PLT 160 143* 638    Basic Metabolic Panel: Recent Labs  Lab 08/14/20 0202 08/15/20 0849 08/16/20 0208 08/17/20 1057 08/17/20 1115 08/17/20 2039 08/18/20 0217  NA 135 135 134* 130*  --   --  132*  K 3.1* 3.4* 3.8 2.2*  --  2.4* 2.5*  CL 89* 92* 94* 83*  --   --  86*  CO2 36* 34* 32 33*  --   --  34*  GLUCOSE 175* 151* 119* 115*  --   --  122*  BUN 21 18 17 18   --   --  17  CREATININE 1.07 1.00 0.97 1.05  --   --  0.98  CALCIUM 8.8* 8.8* 8.8* 8.8*  --   --  8.8*  MG  --   --   --   --  2.1  --  2.2    GFR: Estimated Creatinine Clearance: 138.7 mL/min (by C-G formula based on SCr of 0.98 mg/dL).  Liver Function Tests: Recent Labs  Lab  08/13/20 2007 08/14/20 0202  AST 38 37  ALT 22 23  ALKPHOS 85 72  BILITOT 0.5 0.9  PROT 7.4 6.9  ALBUMIN 3.4* 3.1*    CBG: No results for input(s): GLUCAP in the last 168 hours.   Recent Results (from the past 240 hour(s))  SARS CORONAVIRUS 2 (TAT 6-24 HRS) Nasopharyngeal Nasopharyngeal Swab     Status: None   Collection Time: 08/14/20 12:19 AM   Specimen: Nasopharyngeal Swab  Result Value Ref Range Status   SARS Coronavirus 2 NEGATIVE NEGATIVE Final    Comment: (NOTE) SARS-CoV-2 target nucleic  acids are NOT DETECTED.  The SARS-CoV-2 RNA is generally detectable in upper and lower respiratory specimens during the acute phase of infection. Negative results do not preclude SARS-CoV-2 infection, do not rule out co-infections with other pathogens, and should not be used as the sole basis for treatment or other patient management decisions. Negative results must be combined with clinical observations, patient history, and epidemiological information. The expected result is Negative.  Fact Sheet for Patients: SugarRoll.be  Fact Sheet for Healthcare Providers: https://www.woods-mathews.com/  This test is not yet approved or cleared by the [place] FDA and  has been authorized for detection and/or diagnosis of SARS-CoV-2 by FDA under an Emergency Use Authorization (EUA). This EUA will remain  in effect (meaning this test can be used) for the duration of the COVID-19 declaration under Se ction 564(b)(1) of the Act, 21 U.S.C. section 360bbb-3(b)(1), unless the authorization is terminated or revoked sooner.  Performed at [facility], [place] [address], [place], [place] [address]   Culture, blood (routine x 2)     Status: None (Preliminary result)   Collection Time: 08/17/20  4:45 PM   Specimen: BLOOD RIGHT HAND  Result Value Ref Range Status   Specimen Description BLOOD RIGHT HAND  Final   Special Requests   Final    BOTTLES  DRAWN AEROBIC AND ANAEROBIC Blood Culture adequate volume   Culture   Final    NO GROWTH < 24 HOURS Performed at [facility], [place] [address] [place], [place] [address]    Report Status PENDING  Incomplete  Culture, blood (routine x 2)     Status: None (Preliminary result)   Collection Time: 08/17/20  4:54 PM   Specimen: BLOOD LEFT HAND  Result Value Ref Range Status   Specimen Description BLOOD LEFT HAND  Final   Special Requests   Final    BOTTLES DRAWN AEROBIC AND ANAEROBIC Blood Culture adequate volume   Culture   Final    NO GROWTH < 24 HOURS Performed at [facility], [place] [address], [place], [place] [address]    Report Status PENDING  Incomplete         Radiology Studies: No results found.      Scheduled Meds: . apixaban  5 mg Oral BID  . buprenorphine-naloxone  1 tablet Sublingual BID  . gabapentin  800 mg Oral TID  . metolazone  2.5 mg Oral Q Fri  . metoprolol succinate  25 mg Oral BID  . pantoprazole  40 mg Oral BID  . potassium chloride  40 mEq Oral TID  . pravastatin  20 mg Oral q1800  . sertraline  12.5 mg Oral Daily  . spironolactone  25 mg Oral Daily  . torsemide  60 mg Oral q1800  . torsemide  80 mg Oral Daily  . zolpidem  10 mg Oral QHS   Continuous Infusions: . cefTRIAXone (ROCEPHIN)  IV 2 g (08/18/20 0157)  . vancomycin 1,250 mg (08/18/20 1747)     LOS: 3 days        Hosie Poisson, MD [facility]   To contact the attending provider between 7A-7P or the covering provider during after hours 7P-7A, please log into the web site [email or web address] and access using universal [place] password for that web site. If you do not have the password, please call the hospital operator.  08/18/2020, 6:08 PM

## 2020-08-18 NOTE — Plan of Care (Signed)

## 2020-08-19 ENCOUNTER — Encounter (HOSPITAL_COMMUNITY): Payer: Self-pay | Admitting: Internal Medicine

## 2020-08-19 LAB — BASIC METABOLIC PANEL
Anion gap: 12 (ref 5–15)
BUN: 16 mg/dL (ref 8–23)
CO2: 32 mmol/L (ref 22–32)
Calcium: 8.6 mg/dL — ABNORMAL LOW (ref 8.9–10.3)
Chloride: 90 mmol/L — ABNORMAL LOW (ref 98–111)
Creatinine, Ser: 0.88 mg/dL (ref 0.61–1.24)
GFR, Estimated: >60 mL/min (ref >60)
Glucose, Bld: 116 mg/dL — ABNORMAL HIGH (ref 70–99)
Potassium: 3 mmol/L — ABNORMAL LOW (ref 3.5–5.1)
Sodium: 134 mmol/L — ABNORMAL LOW (ref 135–145)

## 2020-08-19 LAB — POTASSIUM: Potassium: 3.2 mmol/L — ABNORMAL LOW (ref 3.5–5.1)

## 2020-08-19 MED ORDER — POTASSIUM CHLORIDE 10 MEQ/100ML IV SOLN
10.0000 meq | INTRAVENOUS | Status: AC
  Filled 2020-08-19: qty 100

## 2020-08-19 MED ORDER — POTASSIUM CHLORIDE 10 MEQ/100ML IV SOLN
INTRAVENOUS | Status: AC
  Administered 2020-08-19: 10 meq
  Filled 2020-08-19: qty 100

## 2020-08-19 NOTE — Progress Notes (Signed)
PROGRESS NOTE    Charles Gray  MEQ:[id number] DOB: [birth year] DOA: 08/13/2020 PCP: Enid Skeens., MD   Chief Complaint  Patient presents with  . Leg Swelling    Brief Narrative:  Charles Gray is a 65 y.o. male with medical history significant off chronic diastolic heart failure, chronic right-sided heart failure, microcytic anemia, paroxysmal atrial fibrillation/atrial tachycardia, history of PE, history of DVT on Eliquis, chronic pain syndrome, cirrhosis of the liver without ascites, portal hypertensive gastropathy, thrombocytopenia, hyperlipidemia, hypertension, mild protein calorie malnutrition present hypoalbuminemia, chronic hypokalemia, lumbar disc herniation, occasional tremors, obstructive sleep apnea who is coming to the emergency department due to progressively worse bilateral lower extremities erythema for the past 2 weeks that did not respond to oral Keflex. He was started on IV Rocephin, completed 4 doses but his cellulitis was not improving and so IV vancomycin was added.  Patient reports his tenderness and erythema has improved after starting IV vancomycin. Patient seen and examined at bedside no new complaints at this time   Assessment & Plan:   Principal Problem:   Cellulitis of both lower extremities Active Problems:   Mixed hyperlipidemia   Insomnia   Hypoalbuminemia due to protein-calorie malnutrition (HCC)   Hypokalemia   Class 2 severe obesity due to excess calories with serious comorbidity and body mass index (BMI) of 39.0 to 39.9 in adult [facility])   History of pulmonary embolism   OSA (obstructive sleep apnea)   AF (paroxysmal atrial fibrillation) (HCC)   Essential hypertension   Microcytic anemia   Cellulitis  Cellulitis of the lower extremities/venous stasis dermatitis Despite IV rocephin, pts; lower extremity cellulitis didn't resolve, added IV vancomycin and patient's lower extremity erythema and tenderness has improved. Recommend to continue  with torsemide for diuresis, elevate the legs and pain control Pain control with oxycodone.   Severe Hypokalemia:  Replaced.  Repeat levels in the morning.  Pulmonary embolism Continue with Eliquis 5 mg twice daily.   Hyperlipidemia Continue with pravastatin.   Paroxysmal atrial fibrillation Rate control at this time and on Eliquis for anticoagulation.  Hyponatremia probably secondary to fluid overload, improving with diuresis.  Hypertension BP parameters are well controlled.  Continue with metoprolol and torsemide.    Syncopal episode 1 week ago Unclear etiology Carotid duplex is negative.  Tele monitor does not show any arrhythmias.  Recommend outpatient follow-up with cardiology for 30-day event monitor placement.  Mild thrombocytopenia.  Continue to monitor.  Resolved.    Mild anemia of chronic disease:  Hemoglobin stable around 11.   Chronic diastolic heart failure:  Pt has bilateral leg edema . But he denies any sob.  Continue with torsemide and metolazone.    DVT prophylaxis: Eliquis.  Code Status: FULL CODE.  Family Communication: none at bedside.  Disposition:   Status is: Observation  The patient will require care spanning > 2 midnights and should be moved to inpatient because: Ongoing diagnostic testing needed not appropriate for outpatient work up and IV treatments appropriate due to intensity of illness or inability to take PO  Dispo: The patient is from: Home              Anticipated d/c is to: Home              Patient currently is not medically stable to d/c.   Difficult to place patient No       Consultants:   None.   Procedures: (none.  Antimicrobials:  Antibiotics Given (last 72 hours)  Date/Time Action Medication Dose Rate   08/17/20 0122 New Bag/Given   cefTRIAXone (ROCEPHIN) 2 g in sodium chloride 0.9 % 100 mL IVPB 2 g 200 mL/hr   08/17/20 1702 New Bag/Given   vancomycin (VANCOREADY) IVPB 2000 mg/400 mL 2,000 mg 200  mL/hr   08/18/20 0157 New Bag/Given   cefTRIAXone (ROCEPHIN) 2 g in sodium chloride 0.9 % 100 mL IVPB 2 g 200 mL/hr   08/18/20 0512 New Bag/Given   vancomycin (VANCOREADY) IVPB 2000 mg/400 mL 2,000 mg 200 mL/hr   08/18/20 1747 New Bag/Given   vancomycin (VANCOREADY) IVPB 1250 mg/250 mL 1,250 mg 166.7 mL/hr   08/19/20 0051 New Bag/Given   cefTRIAXone (ROCEPHIN) 2 g in sodium chloride 0.9 % 100 mL IVPB 2 g 200 mL/hr   08/19/20 0504 New Bag/Given   vancomycin (VANCOREADY) IVPB 1250 mg/250 mL 1,250 mg 166.7 mL/hr         Subjective: Patient is alert and comfortable not in any kind of distress  Objective: Vitals:   08/18/20 1420 08/18/20 2000 08/19/20 0500 08/19/20 0832  BP: 113/72 136/80  134/75  Pulse: 69 (!) 57  76  Resp: 16 19  16   Temp: 98.5 F (36.9 C) 97.7 F (36.5 C)  98.4 F (36.9 C)  TempSrc: Oral Oral  Oral  SpO2: 95% 99%  97%  Weight:   (!) 169.5 kg   Height:        Intake/Output Summary (Last 24 hours) at 08/19/2020 1401 Last data filed at 08/19/2020 0300 Gross per 24 hour  Intake 791.65 ml  Output --  Net 791.65 ml   Filed Weights   08/15/20 0600 08/16/20 0334 08/19/20 0500  Weight: (!) 168 kg (!) 171 kg (!) 169.5 kg    Examination:  General exam: Obese gentleman, not in any kind of distress Respiratory system: Air entry fair bilateral, no wheezing heard. Cardiovascular system: S1-S2 heard, regular rate rhythm, no JVD, Gastrointestinal system: Abdomen is soft, NT ND BS+ Central nervous system: ALERT and oriented, non focal  Extremities: leg edema improving.  Skin: Improving erythema and tenderness in the lower extremities Psychiatry: Mood is appropriate    Data Reviewed: I have personally reviewed following labs and imaging studies  CBC: Recent Labs  Lab 08/13/20 2007 08/14/20 0202 08/16/20 0208  WBC 9.6 8.0 7.5  NEUTROABS 7.4  --  5.4  HGB 11.7* 11.1* 11.2*  HCT 38.6* 36.5* 37.0*  MCV 79.8* 80.9 80.3  PLT 160 143* 171    Basic  Metabolic Panel: Recent Labs  Lab 08/15/20 0849 08/16/20 0208 08/17/20 1057 08/17/20 1115 08/17/20 2039 08/18/20 0217 08/18/20 1807 08/19/20 0249  NA 135 134* 130*  --   --  132*  --  134*  K 3.4* 3.8 2.2*  --  2.4* 2.5* 3.0* 3.0*  CL 92* 94* 83*  --   --  86*  --  90*  CO2 34* 32 33*  --   --  34*  --  32  GLUCOSE 151* 119* 115*  --   --  122*  --  116*  BUN 18 17 18   --   --  17  --  16  CREATININE 1.00 0.97 1.05  --   --  0.98  --  0.88  CALCIUM 8.8* 8.8* 8.8*  --   --  8.8*  --  8.6*  MG  --   --   --  2.1  --  2.2  --   --  GFR: Estimated Creatinine Clearance: 153.8 mL/min (by C-G formula based on SCr of 0.88 mg/dL).  Liver Function Tests: Recent Labs  Lab 08/13/20 2007 08/14/20 0202  AST 38 37  ALT 22 23  ALKPHOS 85 72  BILITOT 0.5 0.9  PROT 7.4 6.9  ALBUMIN 3.4* 3.1*    CBG: No results for input(s): GLUCAP in the last 168 hours.   Recent Results (from the past 240 hour(s))  SARS CORONAVIRUS 2 (TAT 6-24 HRS) Nasopharyngeal Nasopharyngeal Swab     Status: None   Collection Time: 08/14/20 12:19 AM   Specimen: Nasopharyngeal Swab  Result Value Ref Range Status   SARS Coronavirus 2 NEGATIVE NEGATIVE Final    Comment: (NOTE) SARS-CoV-2 target nucleic acids are NOT DETECTED.  The SARS-CoV-2 RNA is generally detectable in upper and lower respiratory specimens during the acute phase of infection. Negative results do not preclude SARS-CoV-2 infection, do not rule out co-infections with other pathogens, and should not be used as the sole basis for treatment or other patient management decisions. Negative results must be combined with clinical observations, patient history, and epidemiological information. The expected result is Negative.  Fact Sheet for Patients: SugarRoll.be  Fact Sheet for Healthcare Providers: https://www.woods-mathews.com/  This test is not yet approved or cleared by the [place] FDA and   has been authorized for detection and/or diagnosis of SARS-CoV-2 by FDA under an Emergency Use Authorization (EUA). This EUA will remain  in effect (meaning this test can be used) for the duration of the COVID-19 declaration under Se ction 564(b)(1) of the Act, 21 U.S.C. section 360bbb-3(b)(1), unless the authorization is terminated or revoked sooner.  Performed at [facility], [place] [address] [place], [place] [address]   Culture, blood (routine x 2)     Status: None (Preliminary result)   Collection Time: 08/17/20  4:45 PM   Specimen: BLOOD RIGHT HAND  Result Value Ref Range Status   Specimen Description BLOOD RIGHT HAND  Final   Special Requests   Final    BOTTLES DRAWN AEROBIC AND ANAEROBIC Blood Culture adequate volume   Culture   Final    NO GROWTH 2 DAYS Performed at [facility], [place] [address], [place], [place] [address]    Report Status PENDING  Incomplete  Culture, blood (routine x 2)     Status: None (Preliminary result)   Collection Time: 08/17/20  4:54 PM   Specimen: BLOOD LEFT HAND  Result Value Ref Range Status   Specimen Description BLOOD LEFT HAND  Final   Special Requests   Final    BOTTLES DRAWN AEROBIC AND ANAEROBIC Blood Culture adequate volume   Culture   Final    NO GROWTH 2 DAYS Performed at [facility], [place] [address] [place], [place] [address]    Report Status PENDING  Incomplete         Radiology Studies: No results found.      Scheduled Meds: . apixaban  5 mg Oral BID  . buprenorphine-naloxone  1 tablet Sublingual BID  . gabapentin  800 mg Oral TID  . metolazone  2.5 mg Oral Q Fri  . metoprolol succinate  25 mg Oral BID  . pantoprazole  40 mg Oral BID  . potassium chloride  40 mEq Oral TID  . pravastatin  20 mg Oral q1800  . sertraline  12.5 mg Oral Daily  . spironolactone  25 mg Oral Daily  . torsemide  60 mg Oral q1800  .  torsemide  80 mg Oral Daily  . zolpidem  10 mg Oral QHS   Continuous Infusions: .  cefTRIAXone (ROCEPHIN)  IV 2 g (08/19/20 0051)  . potassium chloride    . vancomycin 1,250 mg (08/19/20 0504)     LOS: 4 days        Hosie Poisson, MD [facility]   To contact the attending provider between 7A-7P or the covering provider during after hours 7P-7A, please log into the web site [email or web address] and access using universal [place] password for that web site. If you do not have the password, please call the hospital operator.  08/19/2020, 2:01 PM

## 2020-08-19 NOTE — Progress Notes (Signed)
Having to wait on Potassium IVPB to be hung. IV was leaking- so removed that one, IV team is putting in new one. Pt being difficult where he wants his IV placed, 2nd IV team person coming to assess.

## 2020-08-20 LAB — BASIC METABOLIC PANEL
Anion gap: 11 (ref 5–15)
BUN: 14 mg/dL (ref 8–23)
CO2: 30 mmol/L (ref 22–32)
Calcium: 8.6 mg/dL — ABNORMAL LOW (ref 8.9–10.3)
Chloride: 93 mmol/L — ABNORMAL LOW (ref 98–111)
Creatinine, Ser: 0.97 mg/dL (ref 0.61–1.24)
GFR, Estimated: >60 mL/min (ref >60)
Glucose, Bld: 120 mg/dL — ABNORMAL HIGH (ref 70–99)
Potassium: 3.3 mmol/L — ABNORMAL LOW (ref 3.5–5.1)
Sodium: 134 mmol/L — ABNORMAL LOW (ref 135–145)

## 2020-08-20 LAB — CBC WITH DIFFERENTIAL/PLATELET
Abs Immature Granulocytes: 0.03 10*3/uL (ref 0.00–0.07)
Basophils Absolute: 0 10*3/uL (ref 0.0–0.1)
Basophils Relative: 0 %
Eosinophils Absolute: 0.3 10*3/uL (ref 0.0–0.5)
Eosinophils Relative: 3 %
HCT: 37.6 % — ABNORMAL LOW (ref 39.0–52.0)
Hemoglobin: 11.4 g/dL — ABNORMAL LOW (ref 13.0–17.0)
Immature Granulocytes: 0 %
Lymphocytes Relative: 15 %
Lymphs Abs: 1.2 10*3/uL (ref 0.7–4.0)
MCH: 24.3 pg — ABNORMAL LOW (ref 26.0–34.0)
MCHC: 30.3 g/dL (ref 30.0–36.0)
MCV: 80.2 fL (ref 80.0–100.0)
Monocytes Absolute: 0.7 10*3/uL (ref 0.1–1.0)
Monocytes Relative: 9 %
Neutro Abs: 5.9 10*3/uL (ref 1.7–7.7)
Neutrophils Relative %: 73 %
Platelets: 154 10*3/uL (ref 150–400)
RBC: 4.69 MIL/uL (ref 4.22–5.81)
RDW: 15.8 % — ABNORMAL HIGH (ref 11.5–15.5)
WBC: 8.2 10*3/uL (ref 4.0–10.5)
nRBC: 0 % (ref 0.0–0.2)

## 2020-08-20 MED ORDER — DOXYCYCLINE HYCLATE 50 MG PO CAPS: 100.0000 mg | ORAL_CAPSULE | Freq: Two times a day (BID) | ORAL | 0 refills | Status: DC

## 2020-08-20 MED ORDER — TRAMADOL HCL 50 MG PO TABS: 50.0000 mg | ORAL_TABLET | Freq: Four times a day (QID) | ORAL | 0 refills | Status: DC | PRN

## 2020-08-20 NOTE — Plan of Care (Signed)
Care assumed at 1300 by this RN. Pt's abrasion on right lower leg cleaned and dressing changed. Pt encouraged to keep legs elevated. Call bell within reach.  Problem: Health Behavior/Discharge Planning: Goal: Ability to manage health-related needs will improve Outcome: Progressing   Problem: Activity: Goal: Risk for activity intolerance will decrease Outcome: Progressing   Problem: Pain Managment: Goal: General experience of comfort will improve Outcome: Progressing

## 2020-08-20 NOTE — Progress Notes (Signed)
PROGRESS NOTE    Charles Gray  XBM:[id number] DOB: 06/21/1955 DOA: 08/13/2020 PCP: Enid Skeens., MD   Chief Complaint  Patient presents with  . Leg Swelling    Brief Narrative:  Charles Gray is a 65 y.o. male with medical history significant off chronic diastolic heart failure, chronic right-sided heart failure, microcytic anemia, paroxysmal atrial fibrillation/atrial tachycardia, history of PE, history of DVT on Eliquis, chronic pain syndrome, cirrhosis of the liver without ascites, portal hypertensive gastropathy, thrombocytopenia, hyperlipidemia, hypertension, mild protein calorie malnutrition present hypoalbuminemia, chronic hypokalemia, lumbar disc herniation, occasional tremors, obstructive sleep apnea who is coming to the emergency department due to progressively worse bilateral lower extremities erythema for the past 2 weeks that did not respond to oral Keflex. He was started on IV Rocephin, completed 4 doses but his cellulitis was not improving and so IV vancomycin was added.  Patient reports his tenderness and erythema has improved after starting IV vancomycin but not back to baseline yet.  Patient seen and examined at bedside,  no new complaints at this time   Assessment & Plan:   Principal Problem:   Cellulitis of both lower extremities Active Problems:   Mixed hyperlipidemia   Insomnia   Hypoalbuminemia due to protein-calorie malnutrition (HCC)   Hypokalemia   Class 2 severe obesity due to excess calories with serious comorbidity and body mass index (BMI) of 39.0 to 39.9 in adult [name])   History of pulmonary embolism   OSA (obstructive sleep apnea)   AF (paroxysmal atrial fibrillation) (HCC)   Essential hypertension   Microcytic anemia   Cellulitis  Cellulitis of the lower extremities/venous stasis dermatitis Despite IV rocephin, pts; lower extremity cellulitis didn't resolve, added IV vancomycin and patient's lower extremity erythema and tenderness has  improved. Recommend to continue with torsemide for diuresis, elevate the legs and pain control Pain control with oxycodone.   Severe Hypokalemia:  Replaced.   Pulmonary embolism Continue with Eliquis 5 mg twice daily.   Hyperlipidemia Continue with pravastatin.   Paroxysmal atrial fibrillation Rate controlled at this time and on Eliquis for anticoagulation.  Hyponatremia probably secondary to fluid overload, improving with diuresis.  Hypertension BP parameters are well controlled.  Continue with metoprolol and torsemide.    Syncopal episode 1 week ago Unclear etiology Carotid duplex is negative.  Tele monitor does not show any arrhythmias.  Recommend outpatient follow-up with cardiology for 30-day event monitor placement.  Mild thrombocytopenia.  Continue to monitor.  Resolved.    Mild anemia of chronic disease:  Hemoglobin stable around 11.   Chronic diastolic heart failure:  Pt has bilateral leg edema . But he denies any sob.  Continue with torsemide and metolazone.    DVT prophylaxis: Eliquis.  Code Status: FULL CODE.  Family Communication: none at bedside.  Disposition:   Status is: Observation  The patient will require care spanning > 2 midnights and should be moved to inpatient because: Ongoing diagnostic testing needed not appropriate for outpatient work up and IV treatments appropriate due to intensity of illness or inability to take PO  Dispo: The patient is from: Home              Anticipated d/c is to: Home              Patient currently is not medically stable to d/c.   Difficult to place patient No       Consultants:   None.   Procedures: (none.  Antimicrobials:  Antibiotics Given (last 72 hours)  Date/Time Action Medication Dose Rate   08/17/20 1702 New Bag/Given   vancomycin (VANCOREADY) IVPB 2000 mg/400 mL 2,000 mg 200 mL/hr   08/18/20 0157 New Bag/Given   cefTRIAXone (ROCEPHIN) 2 g in sodium chloride 0.9 % 100 mL IVPB 2 g  200 mL/hr   08/18/20 0512 New Bag/Given   vancomycin (VANCOREADY) IVPB 2000 mg/400 mL 2,000 mg 200 mL/hr   08/18/20 1747 New Bag/Given   vancomycin (VANCOREADY) IVPB 1250 mg/250 mL 1,250 mg 166.7 mL/hr   08/19/20 0051 New Bag/Given   cefTRIAXone (ROCEPHIN) 2 g in sodium chloride 0.9 % 100 mL IVPB 2 g 200 mL/hr   08/19/20 0504 New Bag/Given   vancomycin (VANCOREADY) IVPB 1250 mg/250 mL 1,250 mg 166.7 mL/hr   08/19/20 1627 New Bag/Given   vancomycin (VANCOREADY) IVPB 1250 mg/250 mL 1,250 mg 166.7 mL/hr   08/20/20 0552 New Bag/Given   vancomycin (VANCOREADY) IVPB 1250 mg/250 mL 1,250 mg 166.7 mL/hr         Subjective: No new complaints.   Objective: Vitals:   08/19/20 0832 08/19/20 1300 08/19/20 2150 08/20/20 0700  BP: 134/75 136/70 119/69 126/79  Pulse: 76 70 (!) 51 65  Resp: 16  19 18   Temp: 98.4 F (36.9 C) 98.7 F (37.1 C) 98.3 F (36.8 C) 98.8 F (37.1 C)  TempSrc: Oral Oral Oral Oral  SpO2: 97% 96% (!) 81% 95%  Weight:      Height:        Intake/Output Summary (Last 24 hours) at 08/20/2020 1415 Last data filed at 08/19/2020 1546 Gross per 24 hour  Intake 350 ml  Output --  Net 350 ml   Filed Weights   08/15/20 0600 08/16/20 0334 08/19/20 0500  Weight: (!) 168 kg (!) 171 kg (!) 169.5 kg    Examination:  General exam: elderly gentleman, not in distress.  Respiratory system: air entry fair , bilaterally. No wheezing heard.  Cardiovascular system: S1S2 heard, RRR no JVD.  Gastrointestinal system: Abdomen is soft, non tender non distended, bowel sounds wnl.  Central nervous system: alert and oriented, non foca.  Extremities: leg edema improving.  Skin: improving erythema on the left leg, but tenderness present on the right.  Psychiatry: Mood is appropriate    Data Reviewed: I have personally reviewed following labs and imaging studies  CBC: Recent Labs  Lab 08/13/20 2007 08/14/20 0202 08/16/20 0208 08/20/20 0041  WBC 9.6 8.0 7.5 8.2  NEUTROABS 7.4   --  5.4 5.9  HGB 11.7* 11.1* 11.2* 11.4*  HCT 38.6* 36.5* 37.0* 37.6*  MCV 79.8* 80.9 80.3 80.2  PLT 160 143* 171 465    Basic Metabolic Panel: Recent Labs  Lab 08/16/20 0208 08/17/20 1057 08/17/20 1115 08/17/20 2039 08/18/20 0217 08/18/20 1807 08/19/20 0249 08/19/20 1855 08/20/20 0041  NA 134* 130*  --   --  132*  --  134*  --  134*  K 3.8 2.2*  --    < > 2.5* 3.0* 3.0* 3.2* 3.3*  CL 94* 83*  --   --  86*  --  90*  --  93*  CO2 32 33*  --   --  34*  --  32  --  30  GLUCOSE 119* 115*  --   --  122*  --  116*  --  120*  BUN 17 18  --   --  17  --  16  --  14  CREATININE 0.97 1.05  --   --  0.98  --  0.88  --  0.97  CALCIUM 8.8* 8.8*  --   --  8.8*  --  8.6*  --  8.6*  MG  --   --  2.1  --  2.2  --   --   --   --    < > = values in this interval not displayed.    GFR: Estimated Creatinine Clearance: 139.5 mL/min (by C-G formula based on SCr of 0.97 mg/dL).  Liver Function Tests: Recent Labs  Lab 08/13/20 2007 08/14/20 0202  AST 38 37  ALT 22 23  ALKPHOS 85 72  BILITOT 0.5 0.9  PROT 7.4 6.9  ALBUMIN 3.4* 3.1*    CBG: No results for input(s): GLUCAP in the last 168 hours.   Recent Results (from the past 240 hour(s))  SARS CORONAVIRUS 2 (TAT 6-24 HRS) Nasopharyngeal Nasopharyngeal Swab     Status: None   Collection Time: 08/14/20 12:19 AM   Specimen: Nasopharyngeal Swab  Result Value Ref Range Status   SARS Coronavirus 2 NEGATIVE NEGATIVE Final    Comment: (NOTE) SARS-CoV-2 target nucleic acids are NOT DETECTED.  The SARS-CoV-2 RNA is generally detectable in upper and lower respiratory specimens during the acute phase of infection. Negative results do not preclude SARS-CoV-2 infection, do not rule out co-infections with other pathogens, and should not be used as the sole basis for treatment or other patient management decisions. Negative results must be combined with clinical observations, patient history, and epidemiological information. The  expected result is Negative.  Fact Sheet for Patients: SugarRoll.be  Fact Sheet for Healthcare Providers: https://www.woods-mathews.com/  This test is not yet approved or cleared by the [place] FDA and  has been authorized for detection and/or diagnosis of SARS-CoV-2 by FDA under an Emergency Use Authorization (EUA). This EUA will remain  in effect (meaning this test can be used) for the duration of the COVID-19 declaration under Se ction 564(b)(1) of the Act, 21 U.S.C. section 360bbb-3(b)(1), unless the authorization is terminated or revoked sooner.  Performed at [facility], [place] [address] [place], [place] [address]   Culture, blood (routine x 2)     Status: None (Preliminary result)   Collection Time: 08/17/20  4:45 PM   Specimen: BLOOD RIGHT HAND  Result Value Ref Range Status   Specimen Description BLOOD RIGHT HAND  Final   Special Requests   Final    BOTTLES DRAWN AEROBIC AND ANAEROBIC Blood Culture adequate volume   Culture   Final    NO GROWTH 3 DAYS Performed at [facility], [place] [address], [place], [place] [address]    Report Status PENDING  Incomplete  Culture, blood (routine x 2)     Status: None (Preliminary result)   Collection Time: 08/17/20  4:54 PM   Specimen: BLOOD LEFT HAND  Result Value Ref Range Status   Specimen Description BLOOD LEFT HAND  Final   Special Requests   Final    BOTTLES DRAWN AEROBIC AND ANAEROBIC Blood Culture adequate volume   Culture   Final    NO GROWTH 3 DAYS Performed at [facility], [place] [address], [place], [place] [address]    Report Status PENDING  Incomplete         Radiology Studies: No results found.      Scheduled Meds: . apixaban  5 mg Oral BID  . buprenorphine-naloxone  1 tablet Sublingual BID  . gabapentin  800 mg Oral TID  . metolazone  2.5  mg Oral Q Fri  . metoprolol succinate  25 mg Oral BID  . pantoprazole  40 mg Oral BID  .  potassium chloride  40 mEq Oral TID  . pravastatin  20 mg Oral q1800  . sertraline  12.5 mg Oral Daily  . spironolactone  25 mg Oral Daily  . torsemide  60 mg Oral q1800  . torsemide  80 mg Oral Daily  . zolpidem  10 mg Oral QHS   Continuous Infusions: . vancomycin 1,250 mg (08/20/20 0552)     LOS: 5 days        Hosie Poisson, MD [facility]   To contact the attending provider between 7A-7P or the covering provider during after hours 7P-7A, please log into the web site [email or web address] and access using universal [place] password for that web site. If you do not have the password, please call the hospital operator.  08/20/2020, 2:15 PM

## 2020-08-21 DIAGNOSIS — I872 Venous insufficiency (chronic) (peripheral): Secondary | ICD-10-CM

## 2020-08-21 LAB — BASIC METABOLIC PANEL
Anion gap: 9 (ref 5–15)
BUN: 14 mg/dL (ref 8–23)
CO2: 30 mmol/L (ref 22–32)
Calcium: 9 mg/dL (ref 8.9–10.3)
Chloride: 96 mmol/L — ABNORMAL LOW (ref 98–111)
Creatinine, Ser: 0.86 mg/dL (ref 0.61–1.24)
GFR, Estimated: >60 mL/min (ref >60)
Glucose, Bld: 184 mg/dL — ABNORMAL HIGH (ref 70–99)
Potassium: 3.2 mmol/L — ABNORMAL LOW (ref 3.5–5.1)
Sodium: 135 mmol/L (ref 135–145)

## 2020-08-21 MED ORDER — POTASSIUM CHLORIDE CRYS ER 20 MEQ PO TBCR
40.0000 meq | EXTENDED_RELEASE_TABLET | Freq: Four times a day (QID) | ORAL | Status: DC
  Administered 2020-08-21: 40 meq via ORAL
  Filled 2020-08-21: qty 2

## 2020-08-21 MED ORDER — ORITAVANCIN DIPHOSPHATE 400 MG IV SOLR
1200.0000 mg | Freq: Once | INTRAVENOUS | Status: AC
  Administered 2020-08-21: 1200 mg via INTRAVENOUS
  Filled 2020-08-21: qty 120

## 2020-08-21 MED ORDER — POTASSIUM CHLORIDE CRYS ER 20 MEQ PO TBCR: 40.0000 meq | EXTENDED_RELEASE_TABLET | Freq: Four times a day (QID) | ORAL | 0 refills | Status: DC

## 2020-08-21 NOTE — Progress Notes (Signed)
Physical Therapy Treatment Patient Details Name: Charles Gray MRN: [id number] DOB: [birth year] Today's Date: 08/21/2020    History of Present Illness 65 yo admitted 5/3 with bil LE cellulitis. PMHx: obesity, HFpEF, DVT/PE, cirrhosis, HLD, HTN, Afib, anemia, chronic pain, insomnia, CAD    PT Comments    Pt received sitting EOB. Demonstrates modified independence with transfers. Supervision ambulation 250' with RW. Steady gait, but notably fatigued during last 50' of gait. Pt with c/o L hip pain after amb. Pt in recliner at end of session.    Follow Up Recommendations  No PT follow up     Equipment Recommendations  None recommended by PT    Recommendations for Other Services       Precautions / Restrictions Precautions Precautions: Fall Restrictions Weight Bearing Restrictions: No    Mobility  Bed Mobility Overal bed mobility: Modified Independent                  Transfers Overall transfer level: Modified independent Equipment used: Ambulation equipment used                Ambulation/Gait Ambulation/Gait assistance: Supervision Gait Distance (Feet): 250 Feet Assistive device: Rolling walker (2 wheeled) Gait Pattern/deviations: Step-through pattern;Trunk flexed Gait velocity: decreased Gait velocity interpretation: <1.31 ft/sec, indicative of household ambulator General Gait Details: Trunk flexion due to RW too low (even in highest setting). Pt notably fatigued during last 50' of gait.   Stairs             Wheelchair Mobility    Modified Rankin (Stroke Patients Only)       Balance Overall balance assessment: Needs assistance Sitting-balance support: No upper extremity supported;Feet supported Sitting balance-Leahy Scale: Good     Standing balance support: Bilateral upper extremity supported;During functional activity Standing balance-Leahy Scale: Fair Standing balance comment: static stand and short amb distance in room without UE  support, RW for hallway amb                            Cognition Arousal/Alertness: Awake/alert Behavior During Therapy: Flat affect Overall Cognitive Status: Within Functional Limits for tasks assessed                                        Exercises      General Comments        Pertinent Vitals/Pain Pain Assessment: Faces Faces Pain Scale: Hurts little more Pain Location: L hip after ambulation Pain Descriptors / Indicators: Grimacing;Discomfort;Sore Pain Intervention(s): Monitored during session;Repositioned    Home Living                      Prior Function            PT Goals (current goals can now be found in the care plan section) Acute Rehab PT Goals Patient Stated Goal: return home Progress towards PT goals: Progressing toward goals    Frequency    Min 3X/week      PT Plan Current plan remains appropriate    Co-evaluation              AM-PAC PT "6 Clicks" Mobility   Outcome Measure  Help needed turning from your back to your side while in a flat bed without using bedrails?: None Help needed moving from lying on your back to sitting on the side  of a flat bed without using bedrails?: A Little Help needed moving to and from a bed to a chair (including a wheelchair)?: None Help needed standing up from a chair using your arms (e.g., wheelchair or bedside chair)?: None Help needed to walk in hospital room?: A Little Help needed climbing 3-5 steps with a railing? : A Little 6 Click Score: 21    End of Session   Activity Tolerance: Patient tolerated treatment well Patient left: in chair;with call bell/phone within reach Nurse Communication: Mobility status PT Visit Diagnosis: Other abnormalities of gait and mobility (R26.89);Difficulty in walking, not elsewhere classified (R26.2)     Time: 3735-7897 PT Time Calculation (min) (ACUTE ONLY): 16 min  Charges:  $Gait Training: 8-22 mins                      Lorrin Goodell, PT  Office # [phone] Pager [phone]    Charles Gray 08/21/2020, 12:39 PM

## 2020-08-21 NOTE — Care Management Important Message (Signed)
Important Message  Patient Details  Name: Charles Gray MRN: [id number] Date of Birth: 1955-09-08   Medicare Important Message Given:  Yes     Mathhew Buysse P Brinn Westby 08/21/2020, 11:52 AM

## 2020-08-21 NOTE — Consult Note (Signed)
[facility]    Date of Admission:  08/13/2020     Reason for Consult: Cellulitis     Referring Physician: Dr. Lonny Prude  Current antibiotics: Oritavancin x1  Previous antibiotics: Ceftriaxone 5/3--5/8 Vancomycin 5/7--5/11  ASSESSMENT:    1. Bilateral lower extremity stasis dermatitis: I think his symptoms at this point are more likely secondary to ongoing stasis dermatitis.  It would be unusual to have bilateral lower extremity cellulitis but nonetheless he has completed several days of IV vancomycin and is receiving a dose of oritavancin which should provide an additional 7 to 10 days of adequate antimicrobial coverage.  Discussed with patient the importance of continuing to elevate his legs as well as appropriate compression therapy to alleviate his vascular congestion as a potential risk factor for developing episodes of cellulitis.  He may be a good candidate for Unna boots as an outpatient.  PLAN:    . Dose of oritavancin today and then discharge . Will arrange for clinic follow-up with Dr Megan Salon next week   Principal Problem:   Cellulitis of both lower extremities Active Problems:   Mixed hyperlipidemia   Insomnia   Hypoalbuminemia due to protein-calorie malnutrition (HCC)   Hypokalemia   Class 2 severe obesity due to excess calories with serious comorbidity and body mass index (BMI) of 39.0 to 39.9 in adult [facility])   History of pulmonary embolism   OSA (obstructive sleep apnea)   AF (paroxysmal atrial fibrillation) (HCC)   Essential hypertension   Microcytic anemia   Cellulitis   MEDICATIONS:    Scheduled Meds: . apixaban  5 mg Oral BID  . buprenorphine-naloxone  1 tablet Sublingual BID  . gabapentin  800 mg Oral TID  . metolazone  2.5 mg Oral Q Fri  . metoprolol succinate  25 mg Oral BID  . pantoprazole  40 mg Oral BID  . potassium chloride  40 mEq Oral QID  . pravastatin  20 mg Oral q1800  . sertraline  12.5 mg Oral Daily  .  spironolactone  25 mg Oral Daily  . torsemide  60 mg Oral q1800  . torsemide  80 mg Oral Daily  . zolpidem  10 mg Oral QHS   Continuous Infusions: . oritavancin (ORBACTIV) IVPB 1,200 mg (08/21/20 1414)   PRN Meds:.acetaminophen, oxyCODONE-acetaminophen, polyethylene glycol, prochlorperazine, senna, traMADol  HPI:    Charles Gray is a 65 y.o. male with past medical history significant for chronic diastolic heart failure, anemia, paroxysmal atrial fibrillation, history of PE/DVT on anticoagulation, chronic pain, cirrhosis, portal hypertensive gastropathy, thrombocytopenia, hyperlipidemia, hypertension, chronic hypokalemia, OSA who presented to the emergency department on 08/13/2020 with chief complaint of worsening bilateral lower extremity pain, swelling, and erythema for approximately 2 weeks.  He was treated as an outpatient with oral cephalexin and upon admission was given IV ceftriaxone x4 days.  However, there was concern that his "bilateral cellulitis" was not improving and so IV vancomycin was added on 08/17/2020.  Patient reported that his tenderness and erythema improved after this initiation.  He was seen by Dr. Lonny Prude this morning who took over his care and recommended a dose of IV oritavancin with early hospital discharge.  Patient has otherwise been afebrile throughout the course of his admission and peripheral blood cultures drawn 5/7 are no growth at 4 days.  Currently he is receiving his infusion of oritavancin and he reports that he has had improvement in his symptoms since admission.  He still complains of some pain and  swelling.  He has no fevers or chills, no nausea, no vomiting, no diarrhea.  He is looking forward to going home later this afternoon.   Past Medical History:  Diagnosis Date  . Acute on chronic congestive heart failure ([place])   . Acute on chronic diastolic CHF (congestive heart failure) ([place]) 08/23/2019  . Acute on chronic right-sided heart failure ([place]) 08/06/2019   . Acute respiratory failure with hypoxia ([place])   . Anemia of chronic disease   . Atrial fibrillation ([place]) 12/23/2019  . Atrial tachycardia ([place]) 08/12/2019  . Benign essential HTN   . Chest pain 08/23/2019  . Chronic anticoagulation 12/26/2019  . Chronic heart failure with preserved EF 05/30/2019   Echo 07/2019: EF 55-60, GR 1 DD  . Chronic pain syndrome   . Cirrhosis of liver without ascites ([place])   . Class 2 severe obesity due to excess calories with serious comorbidity and body mass index (BMI) of 39.0 to 39.9 in adult ([place]) 05/30/2019  . Debility 03/13/2019  . Drug induced constipation   . Dyspnea   . Fatty liver disease, nonalcoholic   . GIB (gastrointestinal bleeding) 03/04/2019  . Heart failure, systolic, acute ([place]) 12/27/9448  . History of pulmonary embolism 08/12/2019  . Hyperlipidemia 01/24/2019  . Hypertension 01/24/2019  . Hypoalbuminemia due to protein-calorie malnutrition ([place])   . Hypokalemia   . Hyponatremia   . Hypotension due to drugs   . Insomnia 01/24/2019  . Lactic acidosis 08/23/2019  . Lumbar disc herniation 01/24/2019  . Mixed hyperlipidemia 01/24/2019  . Morbid obesity with BMI of 40.0-44.9, adult ([place]) 05/30/2019  . Normocytic anemia 06/08/2019  . Occasional tremors 08/12/2019  . OSA (obstructive sleep apnea) 08/12/2019  . Palpitations 12/26/2019  . Peripheral edema   . Personal history of DVT (deep vein thrombosis) 12/26/2019  . Portal hypertensive gastropathy ([place]) 03/06/2019   Seen on EGD 03/06/2019  . Pulmonary emboli ([place]) 06/08/2019  . Pulmonary embolism ([place]) 06/08/2019  . Redness and swelling of lower leg 08/23/2019  . Shock ([place])   . Thrombocytopenia (HCC)     Social History   Tobacco Use  . Smoking status: Never Smoker  . Smokeless tobacco: Never Used  Vaping Use  . Vaping Use: Never used  Substance Use Topics  . Alcohol use: Not Currently  . Drug use: Not Currently    Types: Marijuana    Comment: Smoked for 30 years    Family History   Problem Relation Age of Onset  . Hyperlipidemia Mother   . Hypertension Mother   . Stroke Mother   . CAD Maternal Grandmother   . CAD Maternal Grandfather     Allergies  Allergen Reactions  . Penicillins     Broke out in convulsions as a child  Did it involve swelling of the face/tongue/throat, SOB, or low BP? N/A Did it involve sudden or severe rash/hives, skin peeling, or any reaction on the inside of your mouth or nose? N/A Did you need to seek medical attention at a hospital or doctor's office?N/A When did it last happen?N/A If all above answers are "NO", may proceed with cephalosporin use.    Review of Systems  Constitutional: Negative for chills and fever.  Respiratory: Negative.   Cardiovascular: Positive for leg swelling.  Gastrointestinal: Negative.   Genitourinary: Negative.   Musculoskeletal: Negative.   Skin: Positive for rash.  All other systems reviewed and are negative.   OBJECTIVE:   Blood pressure 116/65, pulse 98, temperature 98.7 F (37.1 C), temperature  source Oral, resp. rate 18, height 6' 10"  (2.083 m), weight (!) 169.5 kg, SpO2 98 %. Body mass index is 39.07 kg/m.  Physical Exam Constitutional:      General: He is not in acute distress.    Appearance: Normal appearance. He is obese.     Comments: Patient sitting up in the chair and in no acute distress with his legs dangling to the floor.  HENT:     Head: Normocephalic and atraumatic.  Pulmonary:     Effort: Pulmonary effort is normal. No respiratory distress.  Musculoskeletal:        General: No tenderness.     Right lower leg: Edema present.     Left lower leg: Edema present.     Comments: Bilateral lower extremity pitting edema with signs of chronic venous stasis dermatitis.  No additional warmth or drainage.  Skin:    Findings: Erythema present.  Neurological:     General: No focal deficit present.     Mental Status: He is alert and oriented to person, place, and time.   Psychiatric:        Mood and Affect: Mood normal.        Behavior: Behavior normal.      Lab Results: Lab Results  Component Value Date   WBC 8.2 08/20/2020   HGB 11.4 (L) 08/20/2020   HCT 37.6 (L) 08/20/2020   MCV 80.2 08/20/2020   PLT 154 08/20/2020    Lab Results  Component Value Date   NA 135 08/21/2020   K 3.2 (L) 08/21/2020   CO2 30 08/21/2020   GLUCOSE 184 (H) 08/21/2020   BUN 14 08/21/2020   CREATININE 0.86 08/21/2020   CALCIUM 9.0 08/21/2020   GFRNONAA >60 08/21/2020   GFRAA 82 03/06/2020    Lab Results  Component Value Date   ALT 23 08/14/2020   AST 37 08/14/2020   ALKPHOS 72 08/14/2020   BILITOT 0.9 08/14/2020       Component Value Date/Time   CRP 1.0 (H) 08/23/2019 2100    No results found for: ESRSEDRATE  I have reviewed the micro and lab results in Epic.  Imaging: No results found.   Imaging  independently reviewed in Epic.  Raynelle High[facility] for Infectious Disease [facility] [phone] pager 08/21/2020, 3:14 PM

## 2020-08-21 NOTE — Discharge Summary (Signed)
Physician Discharge Summary  Charles Gray QJF:[id number] DOB: [birth year] DOA: 08/13/2020  PCP: Enid Skeens., MD  Admit date: 08/13/2020 Discharge date: 08/21/2020  Admitted From: Home Disposition: Home  Recommendations for Outpatient Follow-up:  1. Follow up with PCP in 1 week 2. Follow-up with infectious disease as an outpatient 3. Please follow up on the following pending results: None  Home Health: None Equipment/Devices: None  Discharge Condition: Stable CODE STATUS: Full code Diet recommendation: Heart healthy  Brief/Interim Summary:  Admission HPI written by Reubin Milan, MD   HPI: Charles Gray is a 65 y.o. male with medical history significant off chronic diastolic heart failure, chronic right-sided heart failure, microcytic anemia, paroxysmal atrial fibrillation/atrial tachycardia, history of PE, history of DVT on Eliquis, chronic pain syndrome, cirrhosis of the liver without ascites, portal hypertensive gastropathy, thrombocytopenia, hyperlipidemia, hypertension, mild protein calorie malnutrition present hypoalbuminemia, chronic hypokalemia, lumbar disc herniation, occasional tremors, obstructive sleep apnea who is coming to the emergency department due to progressively worse bilateral lower extremities erythema for the past 2 weeks that did not respond to oral Keflex.  He denies fever, chills, but complains of having night sweats about 3 nights ago.  His appetite is decreased.  No renal area, sore throat, wheezing or hemoptysis.  Denies chest pain, palpitations, diaphoresis, PND, orthopnea, but has been lightheaded.  He believes he passed out briefly on Saturday.  He has chronic constipation, denies diarrhea, melena or hematochezia.  No dysuria, frequency or hematuria.  No polyuria, polydipsia, polyphagia or blurred vision.    Hospital course:  Cellulitis In setting of chronic lower extremity venous stasis and wounds.  Blood cultures were obtained on  admission.  As an outpatient, patient was being treated with Keflex which is failing.  On admission, he was started on ceftriaxone with not much improvement in his cellulitis.  He was transitioned to vancomycin IV with improvement.  Patient was given oritavancin x1 prior to discharge to cover for total 10-day total treatment duration.  Infectious disease was consulted prior to discharge and will recommend outpatient follow-up.  Hypokalemia Refractory with diuresis. Patient discharge with kdur QID x1 day with recommendations to resume home regimen, including resumption of eplerenone which was held on admission.  History of pulmonary embolism Continue Eliquis  Hyperlipidemia Continue Pravastatin  Paroxysmal atrial fibrillation Continue Eliquis. Rate controlled.  Primary hypertension Continue metoprolol and eplerenone   History of syncope Recommendation for outpatient cardiology follow-up with consideration for 30-day event monitor  Thrombocytopenia Transient and mild. Resolved.  Anemia of chronic disease Stable.  Chronic diastolic heart failure Continue torsemide and potassium. Continue metolazone weekly as previously prescribed.   Discharge Diagnoses:  Principal Problem:   Cellulitis of both lower extremities Active Problems:   Mixed hyperlipidemia   Insomnia   Hypoalbuminemia due to protein-calorie malnutrition (HCC)   Hypokalemia   Class 2 severe obesity due to excess calories with serious comorbidity and body mass index (BMI) of 39.0 to 39.9 in adult [facility])   History of pulmonary embolism   OSA (obstructive sleep apnea)   AF (paroxysmal atrial fibrillation) ([place])   Essential hypertension   Microcytic anemia   Cellulitis    Discharge Instructions  Discharge Instructions    Diet - low sodium heart healthy   Complete by: As directed    Increase activity slowly   Complete by: As directed      Allergies as of 08/21/2020      Reactions   Penicillins    Broke out  in  convulsions as a child  Did it involve swelling of the face/tongue/throat, SOB, or low BP? N/A Did it involve sudden or severe rash/hives, skin peeling, or any reaction on the inside of your mouth or nose? N/A Did you need to seek medical attention at a hospital or doctor's office?N/A When did it last happen?N/A If all above answers are "NO", may proceed with cephalosporin use.      Medication List    STOP taking these medications   cephALEXin 500 MG capsule Commonly known as: KEFLEX     TAKE these medications   acetaminophen 500 MG tablet Commonly known as: TYLENOL Take 1,000 mg by mouth every 6 (six) hours as needed for moderate pain or headache.   apixaban 5 MG Tabs tablet Commonly known as: Eliquis Take 1 tablet (5 mg total) by mouth 2 (two) times daily.   Buprenorphine HCl-Naloxone HCl 8-2 MG Film Place 1 tablet under the tongue in the morning and at bedtime.   docusate sodium 100 MG capsule Commonly known as: Colace Take 1 capsule (100 mg total) by mouth daily as needed for mild constipation or moderate constipation.   eplerenone 50 MG tablet Commonly known as: INSPRA Take 2 tablets (100 mg total) by mouth daily.   gabapentin 800 MG tablet Commonly known as: NEURONTIN Take 1 tablet (800 mg total) by mouth 3 (three) times daily.   metolazone 2.5 MG tablet Commonly known as: ZAROXOLYN Take 1 tablet (2.5 mg total) by mouth once a week. Every Friday   metoprolol succinate 25 MG 24 hr tablet Commonly known as: Toprol XL Take 1 tablet (25 mg total) by mouth 2 (two) times daily.   ondansetron 4 MG disintegrating tablet Commonly known as: Zofran ODT Take 1 tablet (4 mg total) by mouth every 8 (eight) hours as needed for nausea or vomiting.   pantoprazole 40 MG tablet Commonly known as: PROTONIX Take 40 mg by mouth 2 (two) times daily.   polyethylene glycol powder 17 GM/SCOOP powder Commonly known as: GLYCOLAX/MIRALAX Take 17 g by mouth daily as needed for  mild constipation.   potassium chloride SA 20 MEQ tablet Commonly known as: KLOR-CON Take 2 tablets (40 mEq total) by mouth 4 (four) times daily for 1 day. Then resume 40 meq in AM and 20 meq in PM What changed:   how much to take  how to take this  when to take this  additional instructions   pravastatin 20 MG tablet Commonly known as: PRAVACHOL Take 1 tablet (20 mg total) by mouth daily.   senna 8.6 MG Tabs tablet Commonly known as: SENOKOT Take 2 tablets (17.2 mg total) by mouth daily. What changed:   when to take this  reasons to take this   sertraline 25 MG tablet Commonly known as: ZOLOFT Take 1 tablet (25 mg total) by mouth daily. What changed: how much to take   sodium chloride 0.65 % Soln nasal spray Commonly known as: OCEAN Place 1 spray into both nostrils as needed for congestion.   torsemide 20 MG tablet Commonly known as: Demadex Take 4 tablets (80 mg total) by mouth every morning AND 3 tablets (60 mg total) every evening.   traMADol 50 MG tablet Commonly known as: ULTRAM Take 1 tablet (50 mg total) by mouth every 6 (six) hours as needed for moderate pain.   Ventolin HFA 108 (90 Base) MCG/ACT inhaler Generic drug: albuterol Inhale 1 puff into the lungs every 4 (four) hours as needed for shortness of breath.  zolpidem 10 MG tablet Commonly known as: AMBIEN Take 10 mg by mouth at bedtime.       Follow-up Information    Slatosky, Marshall Cork., MD. Schedule an appointment as soon as possible for a visit in 1 week(s).   Specialty: Family Medicine Why: Hospital follow-up Contact information: [address] [address] [phone]              Allergies  Allergen Reactions  . Penicillins     Broke out in convulsions as a child  Did it involve swelling of the face/tongue/throat, SOB, or low BP? N/A Did it involve sudden or severe rash/hives, skin peeling, or any reaction on the inside of your mouth or nose? N/A Did you need to  seek medical attention at a hospital or doctor's office?N/A When did it last happen?N/A If all above answers are "NO", may proceed with cephalosporin use.    Consultations:  Infectious disease   Procedures/Studies: US THYROID  Result Date: 08/15/2020 CLINICAL DATA:  Other.  History of thyroid nodule. EXAM: THYROID ULTRASOUND TECHNIQUE: Ultrasound examination of the thyroid gland and adjacent soft tissues was performed. COMPARISON:  None. FINDINGS: Parenchymal Echotexture: Mildly heterogenous Isthmus: 0.5 cm Right lobe: 4.7 x 2.1 x 2.2 cm Left lobe: 4.9 x 2.4 x 1.6 cm _________________________________________________________ Estimated total number of nodules >/= 1 cm: 3 Number of spongiform nodules >/=  2 cm not described below (TR1): 0 Number of mixed cystic and solid nodules >/= 1.5 cm not described below (TR2): 0 _________________________________________________________ Nodule # 1: Location: Right; Mid Maximum size: 1.8 cm; Other 2 dimensions: 1.3 x 1.6 cm Composition: spongiform (0) Echogenicity: anechoic (0) Shape: not taller-than-wide (0) Margins: lobulated/irregular (2) Echogenic foci: none (0) ACR TI-RADS total points: 2. ACR TI-RADS risk category: TR2 (2 points). ACR TI-RADS recommendations: This nodule does NOT meet TI-RADS criteria for biopsy or dedicated follow-up. _________________________________________________________ Nodule # 2: Location: Left; Mid Maximum size: 1.9 cm; Other 2 dimensions: 1.7 x 1.4 cm Composition: mixed cystic and solid (1) Echogenicity: hypoechoic (2) Shape: not taller-than-wide (0) Margins: ill-defined (0) Echogenic foci: none (0) ACR TI-RADS total points: 3. ACR TI-RADS risk category: TR3 (3 points). ACR TI-RADS recommendations: *Given size (>/= 1.5 - 2.4 cm) and appearance, a follow-up ultrasound in 1 year should be considered based on TI-RADS criteria. _________________________________________________________ Nodule # 3: Location: Left; Mid Maximum size: 1.4 cm;  Other 2 dimensions: 1.4 x 1.2 cm Composition: solid/almost completely solid (2) Echogenicity: hyperechoic (1) Shape: not taller-than-wide (0) Margins: ill-defined (0) Echogenic foci: none (0) ACR TI-RADS total points: 3. ACR TI-RADS risk category: TR3 (3 points). ACR TI-RADS recommendations: Given size (<1.5 cm) and appearance, this nodule does NOT meet TI-RADS criteria for biopsy or dedicated follow-up. _________________________________________________________ IMPRESSION: 1. Mixed cystic and solid thyroid nodule in the left mid thyroid (labeled 2, 1.9 cm) meets criteria (TI-RADS category 3) for 1 year ultrasound surveillance. 2. The remaining bilateral thyroid nodules appear benign and do not warrant additional ultrasound follow-up or tissue sampling. The above is in keeping with the ACR TI-RADS recommendations - J Am Coll Radiol 2017;14:587-595. Ruthann Cancer, MD [facility] Electronically Signed   By: Ruthann Cancer MD   On: 08/15/2020 07:25   VAS US CAROTID  Result Date: 08/14/2020 Carotid Arterial Duplex Study Patient Name:  AZAVION BOUILLON  Date of Exam:   08/14/2020 Medical Rec #: [id number]        Accession #:    [id number] Date of  Birth: Aug 06, 1955        Patient Gender: M Patient Age:   64Y Exam Location:  [facility] Procedure:      VAS US CAROTID Referring Phys: [id number] [place] --------------------------------------------------------------------------------  Indications:       Syncope. Limitations        Today's exam was limited due to the body habitus of the                    patient. Comparison Study:  No prior studies. Performing Technologist: Darlin Coco RDMS,RVT  Examination Guidelines: A complete evaluation includes B-mode imaging, spectral Doppler, color Doppler, and power Doppler as needed of all accessible portions of each vessel. Bilateral testing is considered an integral part of a complete examination. Limited  examinations for reoccurring indications may be performed as noted.  Right Carotid Findings: +----------+--------+--------+--------+------------------+--------+           PSV cm/sEDV cm/sStenosisPlaque DescriptionComments +----------+--------+--------+--------+------------------+--------+ CCA Prox  112     16                                         +----------+--------+--------+--------+------------------+--------+ CCA Distal109     20                                         +----------+--------+--------+--------+------------------+--------+ ICA Prox  78      17      1-39%   heterogenous               +----------+--------+--------+--------+------------------+--------+ ICA Distal67      26                                         +----------+--------+--------+--------+------------------+--------+ ECA       100                                                +----------+--------+--------+--------+------------------+--------+ +----------+--------+-------+----------------+-------------------+           PSV cm/sEDV cmsDescribe        Arm Pressure (mmHG) +----------+--------+-------+----------------+-------------------+ [id number]            Multiphasic, WNL                    +----------+--------+-------+----------------+-------------------+ +---------+--------+--+--------+--+---------+ VertebralPSV cm/s57EDV cm/s22Antegrade +---------+--------+--+--------+--+---------+  Left Carotid Findings: +----------+-------+-------+--------+---------------------------------+--------+           PSV    EDV    StenosisPlaque Description               Comments           cm/s   cm/s                                                     +----------+-------+-------+--------+---------------------------------+--------+ CCA Prox  159    21                                                        +----------+-------+-------+--------+---------------------------------+--------+  CCA Distal119    27             smooth, heterogenous and                                                  hyperechoic                               +----------+-------+-------+--------+---------------------------------+--------+ ICA Prox  80     20     1-39%   smooth, heterogenous and                                                  hyperechoic                               +----------+-------+-------+--------+---------------------------------+--------+ ICA Distal76     32                                                       +----------+-------+-------+--------+---------------------------------+--------+ ECA       108                                                             +----------+-------+-------+--------+---------------------------------+--------+ +----------+--------+--------+----------------+-------------------+           PSV cm/sEDV cm/sDescribe        Arm Pressure (mmHG) +----------+--------+--------+----------------+-------------------+ [id number]             Multiphasic, WNL                    +----------+--------+--------+----------------+-------------------+ +---------+--------+--+--------+--+---------+ VertebralPSV cm/s58EDV cm/s18Antegrade +---------+--------+--+--------+--+---------+   Summary: Right Carotid: Velocities in the right ICA are consistent with a 1-39% stenosis. Left Carotid: Velocities in the left ICA are consistent with a 1-39% stenosis. Incidental: Bilateral thyroid nodules. Right measuring 1.7 x 1.4 x 1.4 with extra thyroidal extension, microcalcifications, and mixed echogenicity. Left measuring 1.6 x 2.0 x 1.7 with extra thyroidal extension, hypoechoic mixed echogenicity. Vertebrals:  Bilateral vertebral arteries demonstrate antegrade flow. Subclavians: Normal flow hemodynamics were seen in bilateral subclavian              arteries.   *See table(s) above for measurements and observations.  Electronically signed by Jamelle Haring on 08/14/2020 at 5:04:49 PM.    Final       Subjective: Some mild pain but improved.  Discharge Exam: Vitals:   08/21/20 0530 08/21/20 0813  BP: 117/78 116/65  Pulse: 63 98  Resp: 17 18  Temp: 98.1 F (36.7 C) 98.7 F (37.1 C)  SpO2: 95% 98%   Vitals:   08/20/20 1500 08/20/20 2039 08/21/20 0530 08/21/20 0813  BP: 105/67 107/75 117/78 116/65  Pulse: 68 92 63 98  Resp: 17 18 17 18   Temp: 98.1 F (36.7 C)  97.8 F (36.6 C) 98.1 F (36.7 C) 98.7 F (37.1 C)  TempSrc: Oral Oral Oral Oral  SpO2: 96% 99% 95% 98%  Weight:      Height:        General: Pt is alert, awake, not in acute distress Cardiovascular: RRR, S1/S2 +, no rubs, no gallops Respiratory: CTA bilaterally, no wheezing, no rhonchi Abdominal: Soft, NT, ND, bowel sounds + Extremities: BLE edema with venous stasis changes bilaterally    The results of significant diagnostics from this hospitalization (including imaging, microbiology, ancillary and laboratory) are listed below for reference.     Microbiology: Recent Results (from the past 240 hour(s))  SARS CORONAVIRUS 2 (TAT 6-24 HRS) Nasopharyngeal Nasopharyngeal Swab     Status: None   Collection Time: 08/14/20 12:19 AM   Specimen: Nasopharyngeal Swab  Result Value Ref Range Status   SARS Coronavirus 2 NEGATIVE NEGATIVE Final    Comment: (NOTE) SARS-CoV-2 target nucleic acids are NOT DETECTED.  The SARS-CoV-2 RNA is generally detectable in upper and lower respiratory specimens during the acute phase of infection. Negative results do not preclude SARS-CoV-2 infection, do not rule out co-infections with other pathogens, and should not be used as the sole basis for treatment or other patient management decisions. Negative results must be combined with clinical observations, patient history, and epidemiological information. The expected result is Negative.  Fact  Sheet for Patients: SugarRoll.be  Fact Sheet for Healthcare Providers: https://www.woods-mathews.com/  This test is not yet approved or cleared by the [place] FDA and  has been authorized for detection and/or diagnosis of SARS-CoV-2 by FDA under an Emergency Use Authorization (EUA). This EUA will remain  in effect (meaning this test can be used) for the duration of the COVID-19 declaration under Se ction 564(b)(1) of the Act, 21 U.S.C. section 360bbb-3(b)(1), unless the authorization is terminated or revoked sooner.  Performed at [facility], [place] [address], [place], [place] [address]   Culture, blood (routine x 2)     Status: None (Preliminary result)   Collection Time: 08/17/20  4:45 PM   Specimen: BLOOD RIGHT HAND  Result Value Ref Range Status   Specimen Description BLOOD RIGHT HAND  Final   Special Requests   Final    BOTTLES DRAWN AEROBIC AND ANAEROBIC Blood Culture adequate volume   Culture   Final    NO GROWTH 4 DAYS Performed at [facility], [place] [address] [place], [place] [address]    Report Status PENDING  Incomplete  Culture, blood (routine x 2)     Status: None (Preliminary result)   Collection Time: 08/17/20  4:54 PM   Specimen: BLOOD LEFT HAND  Result Value Ref Range Status   Specimen Description BLOOD LEFT HAND  Final   Special Requests   Final    BOTTLES DRAWN AEROBIC AND ANAEROBIC Blood Culture adequate volume   Culture   Final    NO GROWTH 4 DAYS Performed at [facility], [place] [address], [place],  [address]    Report Status PENDING  Incomplete     Labs: BNP (last 3 results) Recent Labs    05/07/20 0140 06/10/20 0259 08/13/20 2007  BNP 351.2* 41.2 45.3   Basic Metabolic Panel: Recent Labs  Lab 08/17/20 1057 08/17/20 1115 08/17/20 2039 08/18/20 0217 08/18/20 1807 08/19/20 0249 08/19/20 1855 08/20/20 0041 08/21/20 0821  NA 130*  --   --  132*  --  134*  --  134*  135  K 2.2*  --    < > 2.5* 3.0* 3.0* 3.2* 3.3* 3.2*  CL 83*  --   --  86*  --  90*  --  93* 96*  CO2 33*  --   --  34*  --  32  --  30 30  GLUCOSE 115*  --   --  122*  --  116*  --  120* 184*  BUN 18  --   --  17  --  16  --  14 14  CREATININE 1.05  --   --  0.98  --  0.88  --  0.97 0.86  CALCIUM 8.8*  --   --  8.8*  --  8.6*  --  8.6* 9.0  MG  --  2.1  --  2.2  --   --   --   --   --    < > = values in this interval not displayed.   Liver Function Tests: No results for input(s): AST, ALT, ALKPHOS, BILITOT, PROT, ALBUMIN in the last 168 hours. No results for input(s): LIPASE, AMYLASE in the last 168 hours. No results for input(s): AMMONIA in the last 168 hours. CBC: Recent Labs  Lab 08/16/20 0208 08/20/20 0041  WBC 7.5 8.2  NEUTROABS 5.4 5.9  HGB 11.2* 11.4*  HCT 37.0* 37.6*  MCV 80.3 80.2  PLT 171 154   Cardiac Enzymes: No results for input(s): CKTOTAL, CKMB, CKMBINDEX, TROPONINI in the last 168 hours. BNP: Invalid input(s): POCBNP CBG: No results for input(s): GLUCAP in the last 168 hours. D-Dimer No results for input(s): DDIMER in the last 72 hours. Hgb A1c No results for input(s): HGBA1C in the last 72 hours. Lipid Profile No results for input(s): CHOL, HDL, LDLCALC, TRIG, CHOLHDL, LDLDIRECT in the last 72 hours. Thyroid function studies No results for input(s): TSH, T4TOTAL, T3FREE, THYROIDAB in the last 72 hours.  Invalid input(s): FREET3 Anemia work up No results for input(s): VITAMINB12, FOLATE, FERRITIN, TIBC, IRON, RETICCTPCT in the last 72 hours. Urinalysis    Component Value Date/Time   COLORURINE YELLOW 08/13/2020 1953   APPEARANCEUR CLEAR 08/13/2020 1953   LABSPEC 1.011 08/13/2020 1953   PHURINE 6.0 08/13/2020 1953   GLUCOSEU NEGATIVE 08/13/2020 1953   HGBUR NEGATIVE 08/13/2020 1953   BILIRUBINUR NEGATIVE 08/13/2020 1953   KETONESUR NEGATIVE 08/13/2020 1953   PROTEINUR NEGATIVE 08/13/2020 1953   NITRITE NEGATIVE 08/13/2020 1953   LEUKOCYTESUR  NEGATIVE 08/13/2020 1953   Sepsis Labs Invalid input(s): PROCALCITONIN,  WBC,  LACTICIDVEN Microbiology Recent Results (from the past 240 hour(s))  SARS CORONAVIRUS 2 (TAT 6-24 HRS) Nasopharyngeal Nasopharyngeal Swab     Status: None   Collection Time: 08/14/20 12:19 AM   Specimen: Nasopharyngeal Swab  Result Value Ref Range Status   SARS Coronavirus 2 NEGATIVE NEGATIVE Final    Comment: (NOTE) SARS-CoV-2 target nucleic acids are NOT DETECTED.  The SARS-CoV-2 RNA is generally detectable in upper and lower respiratory specimens during the acute phase of infection. Negative results do not preclude SARS-CoV-2 infection, do not rule out co-infections with other pathogens, and should not be used as the sole basis for treatment or other patient management decisions. Negative results must be combined with clinical observations, patient history, and epidemiological information. The expected result is Negative.  Fact Sheet for Patients: SugarRoll.be  Fact Sheet for Healthcare Providers: https://www.woods-mathews.com/  This test is not yet approved or cleared by the [place] FDA and  has been authorized for detection and/or diagnosis  of SARS-CoV-2 by FDA under an Emergency Use Authorization (EUA). This EUA will remain  in effect (meaning this test can be used) for the duration of the COVID-19 declaration under Se ction 564(b)(1) of the Act, 21 U.S.C. section 360bbb-3(b)(1), unless the authorization is terminated or revoked sooner.  Performed at [facility], [place] [address] [place], [place] [address]   Culture, blood (routine x 2)     Status: None (Preliminary result)   Collection Time: 08/17/20  4:45 PM   Specimen: BLOOD RIGHT HAND  Result Value Ref Range Status   Specimen Description BLOOD RIGHT HAND  Final   Special Requests   Final    BOTTLES DRAWN AEROBIC AND ANAEROBIC Blood Culture adequate volume   Culture   Final    NO  GROWTH 4 DAYS Performed at [facility], [place] [address], [place], [place] [address]    Report Status PENDING  Incomplete  Culture, blood (routine x 2)     Status: None (Preliminary result)   Collection Time: 08/17/20  4:54 PM   Specimen: BLOOD LEFT HAND  Result Value Ref Range Status   Specimen Description BLOOD LEFT HAND  Final   Special Requests   Final    BOTTLES DRAWN AEROBIC AND ANAEROBIC Blood Culture adequate volume   Culture   Final    NO GROWTH 4 DAYS Performed at [facility], [place] [address] [place], [place] [address]    Report Status PENDING  Incomplete     Time coordinating discharge: 35 minutes  SIGNED:   Cordelia Poche, MD [facility] 08/21/2020, 3:23 PM

## 2020-08-21 NOTE — Progress Notes (Addendum)
Pharmacy Antibiotic Note  Charles Gray is a 65 y.o. male admitted on 08/13/2020 with bilateral leg swelling and redness.  Patient did not respond to outpatient Keflex and did not improve on Rocephin.  Pharmacy has been consulted to add vancomycin and erythema and tenderness improved per MD.  Renal function stable, afebrile, WBC WNL.  Plan: Continue vanc 1231m IV Q12H for AUC 428 using SCr 1 Monitor renal fxn, clinical progress Hold off on vanc levels given possibility of discharge/change to PO abx per discussion with MD on 5/10   Height: 6' 10"  (208.3 cm) Weight: (!) 169.5 kg (373 lb 10.9 oz) IBW/kg (Calculated) : 100.6  Temp (24hrs), Avg:98 F (36.7 C), Min:97.8 F (36.6 C), Max:98.1 F (36.7 C)  Recent Labs  Lab 08/16/20 0208 08/17/20 1057 08/18/20 0217 08/19/20 0249 08/20/20 0041  WBC 7.5  --   --   --  8.2  CREATININE 0.97 1.05 0.98 0.88 0.97    Estimated Creatinine Clearance: 139.5 mL/min (by C-G formula based on SCr of 0.97 mg/dL).    Allergies  Allergen Reactions  . Penicillins     Broke out in convulsions as a child  Did it involve swelling of the face/tongue/throat, SOB, or low BP? N/A Did it involve sudden or severe rash/hives, skin peeling, or any reaction on the inside of your mouth or nose? N/A Did you need to seek medical attention at a hospital or doctor's office?N/A When did it last happen?N/A If all above answers are "NO", may proceed with cephalosporin use.    CTX 5/4 >> 5/9 Vanc 5/7 >>   5/7 BCx - NGTD  Annis Lagoy D. DMina Marble PharmD, BCPS, BClio5/02/2021, 8:15 AM

## 2020-08-22 LAB — CULTURE, BLOOD (ROUTINE X 2)
Culture: NO GROWTH
Culture: NO GROWTH
Special Requests: ADEQUATE
Special Requests: ADEQUATE

## 2020-08-23 ENCOUNTER — Encounter (HOSPITAL_COMMUNITY): Payer: Self-pay

## 2020-08-27 ENCOUNTER — Telehealth (HOSPITAL_COMMUNITY): Payer: Self-pay

## 2020-08-27 ENCOUNTER — Other Ambulatory Visit (HOSPITAL_COMMUNITY): Payer: Self-pay

## 2020-08-27 DIAGNOSIS — I872 Venous insufficiency (chronic) (peripheral): Secondary | ICD-10-CM | POA: Insufficient documentation

## 2020-08-27 NOTE — Telephone Encounter (Signed)
Left message for PT department regarding Charles Gray's inquiry for home physical therapy that was mentioned upon his discharge in the hospital. I will await a returned phone call to continue assisting him.

## 2020-08-27 NOTE — Progress Notes (Signed)
Paramedicine Encounter    Patient ID: Charles Gray, male    DOB: February 18, 1956, 65 y.o.   MRN: [id number]   Patient Care Team: Enid Skeens., MD as PCP - General (Family Medicine) Berniece Salines, DO as PCP - Cardiology (Cardiology) Jorge Ny, LCSW as Social Worker (Licensed Clinical Social Worker)  Patient Active Problem List   Diagnosis Date Noted  . Chronic venous stasis dermatitis of both lower extremities 08/27/2020  . Cellulitis of both lower extremities 08/14/2020  . Microcytic anemia 08/14/2020  . GERD without esophagitis 06/10/2020  . Atrial fibrillation with rapid ventricular response ([place]) 03/24/2020  . Acute on chronic right heart failure ([place]) 01/27/2020  . Acute respiratory failure with hypoxia ([place])   . Fatty liver disease, nonalcoholic   . Shock ([place])   . Personal history of DVT (deep vein thrombosis) 12/26/2019  . Chronic anticoagulation 12/26/2019  . Palpitations 12/26/2019  . AF (paroxysmal atrial fibrillation) ([place]) 12/23/2019  . Heart failure, systolic, acute ([place]) 02/13/1593  . Acute on chronic congestive heart failure ([place])   . Acute on chronic diastolic CHF (congestive heart failure) ([place]) 08/23/2019  . Chest pain 08/23/2019  . Lactic acidosis 08/23/2019  . History of pulmonary embolism 08/12/2019  . Atrial tachycardia ([place]) 08/12/2019  . Occasional tremors 08/12/2019  . OSA (obstructive sleep apnea) 08/12/2019  . Acute on chronic right-sided heart failure ([place]) 08/06/2019  . Pulmonary embolism ([place]) 06/08/2019  . Normocytic anemia 06/08/2019  . Pulmonary emboli ([name]) 06/08/2019  . Chronic heart failure with preserved ejection fraction ([place]) 05/30/2019  . Class 2 severe obesity due to excess calories with serious comorbidity and body mass index (BMI) of 39.0 to 39.9 in adult ([place]) 05/30/2019  . Morbid obesity with BMI of 40.0-44.9, adult ([place]) 05/30/2019  . Hyponatremia   . Drug induced constipation   . Peripheral edema   . Hypotension due to  drugs   . Hypoalbuminemia due to protein-calorie malnutrition ([place])   . Hypokalemia   . Thrombocytopenia ([place])   . Anemia of chronic disease   . Cirrhosis of liver without ascites ([place])   . Chronic pain syndrome   . Debility 03/13/2019  . Portal hypertensive gastropathy ([place]) 03/06/2019  . Dyspnea   . GIB (gastrointestinal bleeding) 03/04/2019  . Mixed hyperlipidemia 01/24/2019  . Insomnia 01/24/2019  . Hyperlipidemia 01/24/2019  . Essential hypertension 01/24/2019  . Lumbar disc herniation 01/24/2019    Current Outpatient Medications:  .  acetaminophen (TYLENOL) 500 MG tablet, Take 1,000 mg by mouth every 6 (six) hours as needed for moderate pain or headache., Disp: , Rfl:  .  apixaban (ELIQUIS) 5 MG TABS tablet, Take 1 tablet (5 mg total) by mouth 2 (two) times daily., Disp: 60 tablet, Rfl: 0 .  Buprenorphine HCl-Naloxone HCl 8-2 MG FILM, Place 1 tablet under the tongue in the morning and at bedtime. , Disp: , Rfl:  .  docusate sodium (COLACE) 100 MG capsule, Take 1 capsule (100 mg total) by mouth daily as needed for mild constipation or moderate constipation. (Patient not taking: No sig reported), Disp: 30 capsule, Rfl: 2 .  eplerenone (INSPRA) 50 MG tablet, Take 2 tablets (100 mg total) by mouth daily., Disp: 60 tablet, Rfl: 3 .  gabapentin (NEURONTIN) 800 MG tablet, Take 1 tablet (800 mg total) by mouth 3 (three) times daily., Disp: 90 tablet, Rfl: 1 .  metolazone (ZAROXOLYN) 2.5 MG tablet, Take 1 tablet (2.5 mg total) by mouth once a week. Every Friday, Disp: 90 tablet, Rfl:  0 .  metoprolol succinate (TOPROL XL) 25 MG 24 hr tablet, Take 1 tablet (25 mg total) by mouth 2 (two) times daily., Disp: 180 tablet, Rfl: 3 .  ondansetron (ZOFRAN ODT) 4 MG disintegrating tablet, Take 1 tablet (4 mg total) by mouth every 8 (eight) hours as needed for nausea or vomiting., Disp: 20 tablet, Rfl: 0 .  pantoprazole (PROTONIX) 40 MG tablet, Take 40 mg by mouth 2 (two) times daily., Disp: , Rfl:  .   polyethylene glycol powder (GLYCOLAX/MIRALAX) 17 GM/SCOOP powder, Take 17 g by mouth daily as needed for mild constipation., Disp: , Rfl:  .  potassium chloride SA (KLOR-CON) 20 MEQ tablet, Take 2 tablets (40 mEq total) by mouth 4 (four) times daily for 1 day. Then resume 40 meq in AM and 20 meq in PM, Disp: 10 tablet, Rfl: 0 .  pravastatin (PRAVACHOL) 20 MG tablet, Take 1 tablet (20 mg total) by mouth daily., Disp: 30 tablet, Rfl: 1 .  senna (SENOKOT) 8.6 MG TABS tablet, Take 2 tablets (17.2 mg total) by mouth daily. (Patient taking differently: Take 2 tablets by mouth daily as needed for mild constipation.), Disp: 120 tablet, Rfl: 0 .  sertraline (ZOLOFT) 25 MG tablet, Take 1 tablet (25 mg total) by mouth daily. (Patient taking differently: Take 12.5 mg by mouth daily.), Disp: 30 tablet, Rfl: 0 .  sodium chloride (OCEAN) 0.65 % SOLN nasal spray, Place 1 spray into both nostrils as needed for congestion., Disp: , Rfl:  .  torsemide (DEMADEX) 20 MG tablet, Take 4 tablets (80 mg total) by mouth every morning AND 3 tablets (60 mg total) every evening., Disp: 210 tablet, Rfl: 11 .  traMADol (ULTRAM) 50 MG tablet, Take 1 tablet (50 mg total) by mouth every 6 (six) hours as needed for moderate pain., Disp: 12 tablet, Rfl: 0 .  VENTOLIN HFA 108 (90 Base) MCG/ACT inhaler, Inhale 1 puff into the lungs every 4 (four) hours as needed for shortness of breath., Disp: 6.7 g, Rfl: 1 .  zolpidem (AMBIEN) 10 MG tablet, Take 10 mg by mouth at bedtime., Disp: , Rfl:  Allergies  Allergen Reactions  . Penicillins     Broke out in convulsions as a child  Did it involve swelling of the face/tongue/throat, SOB, or low BP? N/A Did it involve sudden or severe rash/hives, skin peeling, or any reaction on the inside of your mouth or nose? N/A Did you need to seek medical attention at a hospital or doctor's office?N/A When did it last happen?N/A If all above answers are "NO", may proceed with cephalosporin use.      Social History   Socioeconomic History  . Marital status: Single    Spouse name: Not on file  . Number of children: Not on file  . Years of education: Not on file  . Highest education level: Not on file  Occupational History  . Not on file  Tobacco Use  . Smoking status: Never Smoker  . Smokeless tobacco: Never Used  Vaping Use  . Vaping Use: Never used  Substance and Sexual Activity  . Alcohol use: Not Currently  . Drug use: Not Currently    Types: Marijuana    Comment: Smoked for 30 years  . Sexual activity: Not on file  Other Topics Concern  . Not on file  Social History Narrative  . Not on file   Social Determinants of Health   Financial Resource Strain: Medium Risk  . Difficulty of Paying Living Expenses:  Somewhat hard  Food Insecurity: No Food Insecurity  . Worried About Charity fundraiser in the Last Year: Never true  . Ran Out of Food in the Last Year: Never true  Transportation Needs: Unmet Transportation Needs  . Lack of Transportation (Medical): Yes  . Lack of Transportation (Non-Medical): No  Physical Activity: Not on file  Stress: Not on file  Social Connections: Not on file  Intimate Partner Violence: Not on file    Physical Exam Constitutional:      Appearance: Normal appearance. He is normal weight.  HENT:     Head: Normocephalic.     Nose: Nose normal.     Mouth/Throat:     Mouth: Mucous membranes are moist.     Pharynx: Oropharynx is clear.  Eyes:     Conjunctiva/sclera: Conjunctivae normal.     Pupils: Pupils are equal, round, and reactive to light.  Cardiovascular:     Rate and Rhythm: Normal rate.     Pulses: Normal pulses.     Heart sounds: Normal heart sounds.  Pulmonary:     Effort: Pulmonary effort is normal.     Breath sounds: Normal breath sounds.  Abdominal:     General: Abdomen is flat.     Palpations: Abdomen is soft.  Musculoskeletal:        General: Swelling present. Normal range of motion.     Cervical back:  Normal range of motion.     Right lower leg: Edema present.     Left lower leg: Edema present.     Comments: Bilateral leg swelling, redness, fluid pocket present on right tib/fib area.   Skin:    General: Skin is warm and dry.     Capillary Refill: Capillary refill takes less than 2 seconds.  Neurological:     General: No focal deficit present.     Mental Status: He is alert. Mental status is at baseline.  Psychiatric:        Mood and Affect: Mood normal.     Arrived for home visit for Remijio who was alert and oriented reporting to be feeling okay this morning but reports his leg swelling is worse now than it was prior to hospitalization. Bilateral leg swelling, redness and edema noted. Janathan has a follow up appointment with Infectious Disease tomorrow. I advised Neri to follow up with his PCP as soon as possible. Vitals were obtained and medications were reviewed. Pill box for the week was filled accordingly. Caitlin reports having frequent dizziness daily but no orthostatic changes. Osborn reports having bouts of shortness of breath, chest pain, abdominal pain and dizziness randomly. He has expressed these concerns most recently while admitted but no findings present according to reports. Sanjeev will be following up with HF clinic in two weeks.  Kedrick was reminded of upcoming appointments and he confirmed he will get transportation set up for same. I will continue to follow up weekly. Home visit complete.    Refills: Torsemide Zoloft     Future Appointments  Date Time Provider [place]  08/28/2020 11:00 AM Michel Bickers, MD [facility]  09/10/2020 11:30 AM [facility] PA/NP [facility] None     ACTION: Home visit completed Next visit planned for one week

## 2020-08-28 ENCOUNTER — Ambulatory Visit (INDEPENDENT_AMBULATORY_CARE_PROVIDER_SITE_OTHER): Payer: Medicare HMO | Admitting: Internal Medicine

## 2020-08-28 ENCOUNTER — Encounter: Payer: Self-pay | Admitting: Internal Medicine

## 2020-08-28 ENCOUNTER — Other Ambulatory Visit: Payer: Self-pay

## 2020-08-28 DIAGNOSIS — I872 Venous insufficiency (chronic) (peripheral): Secondary | ICD-10-CM | POA: Diagnosis not present

## 2020-08-28 NOTE — Assessment & Plan Note (Signed)
His primary problem is venous insufficiency.  I am not sure that he actually had infectious cellulitis.  He was afebrile and it is unusual to have bilateral cellulitis at the same time.  He has certainly received adequate therapy for possible superimposed cellulitis.  At this point the focus should be on helping his to superficial venous stasis ulcers heal and managing his lower extremity swelling on a chronic basis.  He may benefit from referral to the wound center.  He will follow-up here in 1 month.

## 2020-08-28 NOTE — Progress Notes (Signed)
[facility]  Patient Active Problem List   Diagnosis Date Noted  . Chronic venous stasis dermatitis of both lower extremities 08/27/2020    Priority: High  . Cellulitis of both lower extremities 08/14/2020    Priority: High  . Microcytic anemia 08/14/2020  . GERD without esophagitis 06/10/2020  . Atrial fibrillation with rapid ventricular response ([place]) 03/24/2020  . Acute on chronic right heart failure ([place]) 01/27/2020  . Acute respiratory failure with hypoxia ([place])   . Fatty liver disease, nonalcoholic   . Shock ([place])   . Personal history of DVT (deep vein thrombosis) 12/26/2019  . Chronic anticoagulation 12/26/2019  . Palpitations 12/26/2019  . AF (paroxysmal atrial fibrillation) ([place]) 12/23/2019  . Heart failure, systolic, acute ([place]) 04/15/7251  . Acute on chronic congestive heart failure ([place])   . Acute on chronic diastolic CHF (congestive heart failure) ([place]) 08/23/2019  . Chest pain 08/23/2019  . Lactic acidosis 08/23/2019  . History of pulmonary embolism 08/12/2019  . Atrial tachycardia ([place]) 08/12/2019  . Occasional tremors 08/12/2019  . OSA (obstructive sleep apnea) 08/12/2019  . Acute on chronic right-sided heart failure ([place]) 08/06/2019  . Pulmonary embolism ([place]) 06/08/2019  . Normocytic anemia 06/08/2019  . Pulmonary emboli ([place]) 06/08/2019  . Chronic heart failure with preserved ejection fraction ([place]) 05/30/2019  . Class 2 severe obesity due to excess calories with serious comorbidity and body mass index (BMI) of 39.0 to 39.9 in adult ([place]) 05/30/2019  . Morbid obesity with BMI of 40.0-44.9, adult ([place]) 05/30/2019  . Hyponatremia   . Drug induced constipation   . Peripheral edema   . Hypotension due to drugs   . Hypoalbuminemia due to protein-calorie malnutrition ([place])   . Hypokalemia   . Thrombocytopenia ([place])   . Anemia of chronic disease   . Cirrhosis of liver without ascites ([place])   . Chronic pain syndrome   .  Debility 03/13/2019  . Portal hypertensive gastropathy ([place]) 03/06/2019  . Dyspnea   . GIB (gastrointestinal bleeding) 03/04/2019  . Mixed hyperlipidemia 01/24/2019  . Insomnia 01/24/2019  . Hyperlipidemia 01/24/2019  . Essential hypertension 01/24/2019  . Lumbar disc herniation 01/24/2019    Patient's Medications  New Prescriptions   No medications on file  Previous Medications   ACETAMINOPHEN (TYLENOL) 500 MG TABLET    Take 1,000 mg by mouth every 6 (six) hours as needed for moderate pain or headache.   APIXABAN (ELIQUIS) 5 MG TABS TABLET    Take 1 tablet (5 mg total) by mouth 2 (two) times daily.   BUPRENORPHINE HCL-NALOXONE HCL 8-2 MG FILM    Place 1 tablet under the tongue in the morning and at bedtime.    DOCUSATE SODIUM (COLACE) 100 MG CAPSULE    Take 1 capsule (100 mg total) by mouth daily as needed for mild constipation or moderate constipation.   EPLERENONE (INSPRA) 50 MG TABLET    Take 2 tablets (100 mg total) by mouth daily.   GABAPENTIN (NEURONTIN) 800 MG TABLET    Take 1 tablet (800 mg total) by mouth 3 (three) times daily.   METOLAZONE (ZAROXOLYN) 2.5 MG TABLET    Take 1 tablet (2.5 mg total) by mouth once a week. Every Friday   METOPROLOL SUCCINATE (TOPROL XL) 25 MG 24 HR TABLET    Take 1 tablet (25 mg total) by mouth 2 (two) times daily.   ONDANSETRON (ZOFRAN ODT) 4 MG DISINTEGRATING TABLET    Take 1 tablet (  4 mg total) by mouth every 8 (eight) hours as needed for nausea or vomiting.   PANTOPRAZOLE (PROTONIX) 40 MG TABLET    Take 40 mg by mouth 2 (two) times daily.   POLYETHYLENE GLYCOL POWDER (GLYCOLAX/MIRALAX) 17 GM/SCOOP POWDER    Take 17 g by mouth daily as needed for mild constipation.   POTASSIUM CHLORIDE SA (KLOR-CON) 20 MEQ TABLET    Take 2 tablets (40 mEq total) by mouth 4 (four) times daily for 1 day. Then resume 40 meq in AM and 20 meq in PM   PRAVASTATIN (PRAVACHOL) 20 MG TABLET    Take 1 tablet (20 mg total) by mouth daily.   SENNA (SENOKOT) 8.6 MG TABS  TABLET    Take 2 tablets (17.2 mg total) by mouth daily.   SERTRALINE (ZOLOFT) 25 MG TABLET    Take 1 tablet (25 mg total) by mouth daily.   SODIUM CHLORIDE (OCEAN) 0.65 % SOLN NASAL SPRAY    Place 1 spray into both nostrils as needed for congestion.   TORSEMIDE (DEMADEX) 20 MG TABLET    Take 4 tablets (80 mg total) by mouth every morning AND 3 tablets (60 mg total) every evening.   TRAMADOL (ULTRAM) 50 MG TABLET    Take 1 tablet (50 mg total) by mouth every 6 (six) hours as needed for moderate pain.   VENTOLIN HFA 108 (90 BASE) MCG/ACT INHALER    Inhale 1 puff into the lungs every 4 (four) hours as needed for shortness of breath.   ZOLPIDEM (AMBIEN) 10 MG TABLET    Take 10 mg by mouth at bedtime.  Modified Medications   No medications on file  Discontinued Medications   No medications on file    Subjective: Charles Gray is in for his hospital follow-up visit.  Charles Gray is a 65 year old with multiple medical problems including diastolic heart failure, PAT, sleep apnea and a history of DVT/PE.  Charles Gray was admitted to the hospital on 08/14/2020 with progressively worsening lower extremity edema and erythema in both legs.  Charles Gray was afebrile but gave a history of having night sweats.  Charles Gray was started on IV antibiotic therapy for possible bilateral cellulitis.  Charles Gray says that the swelling and erythema improved while Charles Gray was in the hospital.  Charles Gray received 1 dose of IV oritavancin on 08/22/2010, the day of discharge.  Charles Gray says that the erythema is much better but Charles Gray is having more problems with swelling and recently had a blister on his right leg "bust".  Charles Gray has not had any fever, chills or sweats since discharge.  Charles Gray does have some nausea and wonders if that could be related to the long-acting antibiotic Charles Gray received before discharge.  Charles Gray recalls being told by his cardiologist that the swelling in his feet was due to heart failure and problems with his circulation.  Charles Gray has used compressive stockings intermittently in the  past but finds them difficult to put on and uncomfortable.  Review of Systems: Review of Systems  Constitutional: Negative for chills, diaphoresis and fever.    Past Medical History:  Diagnosis Date  . Acute on chronic congestive heart failure ([place])   . Acute on chronic diastolic CHF (congestive heart failure) ([place]) 08/23/2019  . Acute on chronic right-sided heart failure ([place]) 08/06/2019  . Acute respiratory failure with hypoxia ([place])   . Anemia of chronic disease   . Atrial fibrillation ([place]) 12/23/2019  . Atrial tachycardia ([place]) 08/12/2019  . Benign essential HTN   . Chest pain  08/23/2019  . Chronic anticoagulation 12/26/2019  . Chronic heart failure with preserved EF 05/30/2019   Echo 07/2019: EF 55-60, GR 1 DD  . Chronic pain syndrome   . Cirrhosis of liver without ascites ([place])   . Class 2 severe obesity due to excess calories with serious comorbidity and body mass index (BMI) of 39.0 to 39.9 in adult ([place]) 05/30/2019  . Debility 03/13/2019  . Drug induced constipation   . Dyspnea   . Fatty liver disease, nonalcoholic   . GIB (gastrointestinal bleeding) 03/04/2019  . Heart failure, systolic, acute ([place]) 6/60/6301  . History of pulmonary embolism 08/12/2019  . Hyperlipidemia 01/24/2019  . Hypertension 01/24/2019  . Hypoalbuminemia due to protein-calorie malnutrition ([place])   . Hypokalemia   . Hyponatremia   . Hypotension due to drugs   . Insomnia 01/24/2019  . Lactic acidosis 08/23/2019  . Lumbar disc herniation 01/24/2019  . Mixed hyperlipidemia 01/24/2019  . Morbid obesity with BMI of 40.0-44.9, adult ([place]) 05/30/2019  . Normocytic anemia 06/08/2019  . Occasional tremors 08/12/2019  . OSA (obstructive sleep apnea) 08/12/2019  . Palpitations 12/26/2019  . Peripheral edema   . Personal history of DVT (deep vein thrombosis) 12/26/2019  . Portal hypertensive gastropathy ([place]) 03/06/2019   Seen on EGD 03/06/2019  . Pulmonary emboli ([place]) 06/08/2019  . Pulmonary embolism ([place])  06/08/2019  . Redness and swelling of lower leg 08/23/2019  . Shock ([place])   . Thrombocytopenia ([facility])     Social History   Tobacco Use  . Smoking status: Never Smoker  . Smokeless tobacco: Never Used  Vaping Use  . Vaping Use: Never used  Substance Use Topics  . Alcohol use: Not Currently  . Drug use: Not Currently    Types: Marijuana    Comment: Smoked for 30 years    Family History  Problem Relation Age of Onset  . Hyperlipidemia Mother   . Hypertension Mother   . Stroke Mother   . CAD Maternal Grandmother   . CAD Maternal Grandfather     Allergies  Allergen Reactions  . Penicillins     Broke out in convulsions as a child  Did it involve swelling of the face/tongue/throat, SOB, or low BP? N/A Did it involve sudden or severe rash/hives, skin peeling, or any reaction on the inside of your mouth or nose? N/A Did you need to seek medical attention at a hospital or doctor's office?N/A When did it last happen?N/A If all above answers are "NO", may proceed with cephalosporin use.    Objective: Vitals:   08/28/20 1046  BP: 110/68  Pulse: 73  Resp: 16  Temp: 98.6 F (37 C)  SpO2: 96%  Weight: (!) 368 lb (166.9 kg)  Height: 6' 10"  (2.083 m)   Body mass index is 38.48 kg/m.  Physical Exam Constitutional:      Comments: Charles Gray is pleasant and in no distress.  Charles Gray is seated in his wheelchair.  Skin:    Comments: Charles Gray has pitting edema of both lower legs and feet, right greater than left.  Charles Gray has chronic venous stasis dermatitis of both legs with hyperpigmentation.  Charles Gray has two quarter sized superficial ulcers on his right calf.  There is no drainage or odor.  I see no evidence of persistent cellulitis.       Lab Results    Problem List Items Addressed This Visit      High   Chronic venous stasis dermatitis of both lower extremities    His  primary problem is venous insufficiency.  I am not sure that Charles Gray actually had infectious cellulitis.  Charles Gray was afebrile and it is  unusual to have bilateral cellulitis at the same time.  Charles Gray has certainly received adequate therapy for possible superimposed cellulitis.  At this point the focus should be on helping his to superficial venous stasis ulcers heal and managing his lower extremity swelling on a chronic basis.  Charles Gray may benefit from referral to the wound center.  Charles Gray will follow-up here in 1 month.          Michel Bickers, MD [facility] [phone] pager   [phone] cell 08/28/2020, 11:45 AM

## 2020-08-29 ENCOUNTER — Encounter (HOSPITAL_COMMUNITY): Payer: Self-pay

## 2020-08-30 ENCOUNTER — Encounter (HOSPITAL_COMMUNITY): Payer: Self-pay

## 2020-08-30 ENCOUNTER — Other Ambulatory Visit (HOSPITAL_COMMUNITY): Payer: Self-pay | Admitting: *Deleted

## 2020-08-30 MED ORDER — METOLAZONE 2.5 MG PO TABS
2.5000 mg | ORAL_TABLET | ORAL | 0 refills | Status: DC
Start: 2020-09-02 — End: 2020-09-10

## 2020-08-30 MED ORDER — TORSEMIDE 20 MG PO TABS: ORAL_TABLET | ORAL | 11 refills | Status: DC

## 2020-09-02 ENCOUNTER — Telehealth: Payer: Self-pay

## 2020-09-02 DIAGNOSIS — I872 Venous insufficiency (chronic) (peripheral): Secondary | ICD-10-CM

## 2020-09-02 NOTE — Addendum Note (Signed)
Addended by: Eugenia Mcalpine on: 09/02/2020 11:26 AM   Modules accepted: Orders

## 2020-09-02 NOTE — Telephone Encounter (Signed)
error 

## 2020-09-02 NOTE — Telephone Encounter (Signed)
Patient made aware of referral placed. Also provided patient with contact information. Charles Gray

## 2020-09-02 NOTE — Telephone Encounter (Signed)
Patient was seen last week by Dr. Megan Salon. Patient states his legs are starting to become more red, tender to touch, and are swollen. Patient denies any fevers. Discussed with patient that per notes MD suspected venous insufficieny and recommends a wound care. Patient agrees to wound care referral. Routing message to Dr. Megan Salon for any addition advise. Eugenia Mcalpine

## 2020-09-03 ENCOUNTER — Other Ambulatory Visit (HOSPITAL_COMMUNITY): Payer: Self-pay

## 2020-09-03 NOTE — Progress Notes (Signed)
Paramedicine Encounter    Patient ID: Charles Gray, male    DOB: [birth year], 65 y.o.   MRN: [id number]   Patient Care Team: Enid Skeens., MD as PCP - General (Family Medicine) Berniece Salines, DO as PCP - Cardiology (Cardiology) Jorge Ny, LCSW as Social Worker (Licensed Clinical Social Worker)  Patient Active Problem List   Diagnosis Date Noted  . Chronic venous stasis dermatitis of both lower extremities 08/27/2020  . Cellulitis of both lower extremities 08/14/2020  . Microcytic anemia 08/14/2020  . GERD without esophagitis 06/10/2020  . Atrial fibrillation with rapid ventricular response ([place]) 03/24/2020  . Acute on chronic right heart failure ([place]) 01/27/2020  . Acute respiratory failure with hypoxia ([place])   . Fatty liver disease, nonalcoholic   . Shock ([place])   . Personal history of DVT (deep vein thrombosis) 12/26/2019  . Chronic anticoagulation 12/26/2019  . Palpitations 12/26/2019  . AF (paroxysmal atrial fibrillation) ([place]) 12/23/2019  . Heart failure, systolic, acute ([place]) 26/33/3545  . Acute on chronic congestive heart failure ([place])   . Acute on chronic diastolic CHF (congestive heart failure) ([place]) 08/23/2019  . Chest pain 08/23/2019  . Lactic acidosis 08/23/2019  . History of pulmonary embolism 08/12/2019  . Atrial tachycardia ([place]) 08/12/2019  . Occasional tremors 08/12/2019  . OSA (obstructive sleep apnea) 08/12/2019  . Acute on chronic right-sided heart failure ([place]) 08/06/2019  . Pulmonary embolism ([place]) 06/08/2019  . Normocytic anemia 06/08/2019  . Pulmonary emboli ([place]) 06/08/2019  . Chronic heart failure with preserved ejection fraction ([place]) 05/30/2019  . Class 2 severe obesity due to excess calories with serious comorbidity and body mass index (BMI) of 39.0 to 39.9 in adult ([place]) 05/30/2019  . Morbid obesity with BMI of 40.0-44.9, adult ([place]) 05/30/2019  . Hyponatremia   . Drug induced constipation   . Peripheral edema   . Hypotension due to  drugs   . Hypoalbuminemia due to protein-calorie malnutrition ([place])   . Hypokalemia   . Thrombocytopenia ([place])   . Anemia of chronic disease   . Cirrhosis of liver without ascites ([place])   . Chronic pain syndrome   . Debility 03/13/2019  . Portal hypertensive gastropathy ([place]) 03/06/2019  . Dyspnea   . GIB (gastrointestinal bleeding) 03/04/2019  . Mixed hyperlipidemia 01/24/2019  . Insomnia 01/24/2019  . Hyperlipidemia 01/24/2019  . Essential hypertension 01/24/2019  . Lumbar disc herniation 01/24/2019    Current Outpatient Medications:  .  acetaminophen (TYLENOL) 500 MG tablet, Take 1,000 mg by mouth every 6 (six) hours as needed for moderate pain or headache., Disp: , Rfl:  .  apixaban (ELIQUIS) 5 MG TABS tablet, Take 1 tablet (5 mg total) by mouth 2 (two) times daily., Disp: 60 tablet, Rfl: 0 .  Buprenorphine HCl-Naloxone HCl 8-2 MG FILM, Place 1 tablet under the tongue in the morning and at bedtime. , Disp: , Rfl:  .  docusate sodium (COLACE) 100 MG capsule, Take 1 capsule (100 mg total) by mouth daily as needed for mild constipation or moderate constipation., Disp: 30 capsule, Rfl: 2 .  eplerenone (INSPRA) 50 MG tablet, Take 2 tablets (100 mg total) by mouth daily., Disp: 60 tablet, Rfl: 3 .  gabapentin (NEURONTIN) 800 MG tablet, Take 1 tablet (800 mg total) by mouth 3 (three) times daily., Disp: 90 tablet, Rfl: 1 .  metolazone (ZAROXOLYN) 2.5 MG tablet, Take 1 tablet (2.5 mg total) by mouth 2 (two) times a week. Every Monday and Friday, Disp: 90 tablet, Rfl: 0 .  metoprolol succinate (TOPROL XL) 25 MG 24 hr tablet, Take 1 tablet (25 mg total) by mouth 2 (two) times daily., Disp: 180 tablet, Rfl: 3 .  ondansetron (ZOFRAN ODT) 4 MG disintegrating tablet, Take 1 tablet (4 mg total) by mouth every 8 (eight) hours as needed for nausea or vomiting., Disp: 20 tablet, Rfl: 0 .  pantoprazole (PROTONIX) 40 MG tablet, Take 40 mg by mouth 2 (two) times daily., Disp: , Rfl:  .  polyethylene  glycol powder (GLYCOLAX/MIRALAX) 17 GM/SCOOP powder, Take 17 g by mouth daily as needed for mild constipation., Disp: , Rfl:  .  potassium chloride SA (KLOR-CON) 20 MEQ tablet, Take 2 tablets (40 mEq total) by mouth 4 (four) times daily for 1 day. Then resume 40 meq in AM and 20 meq in PM, Disp: 10 tablet, Rfl: 0 .  pravastatin (PRAVACHOL) 20 MG tablet, Take 1 tablet (20 mg total) by mouth daily., Disp: 30 tablet, Rfl: 1 .  senna (SENOKOT) 8.6 MG TABS tablet, Take 2 tablets (17.2 mg total) by mouth daily., Disp: 120 tablet, Rfl: 0 .  sertraline (ZOLOFT) 25 MG tablet, Take 1 tablet (25 mg total) by mouth daily. (Patient taking differently: Take 12.5 mg by mouth daily.), Disp: 30 tablet, Rfl: 0 .  sodium chloride (OCEAN) 0.65 % SOLN nasal spray, Place 1 spray into both nostrils as needed for congestion., Disp: , Rfl:  .  torsemide (DEMADEX) 20 MG tablet, Take 4 tablets (80 mg total) by mouth every morning AND 4 tablets (80 mg total) every evening., Disp: 210 tablet, Rfl: 11 .  traMADol (ULTRAM) 50 MG tablet, Take 1 tablet (50 mg total) by mouth every 6 (six) hours as needed for moderate pain., Disp: 12 tablet, Rfl: 0 .  VENTOLIN HFA 108 (90 Base) MCG/ACT inhaler, Inhale 1 puff into the lungs every 4 (four) hours as needed for shortness of breath., Disp: 6.7 g, Rfl: 1 .  zolpidem (AMBIEN) 10 MG tablet, Take 10 mg by mouth at bedtime., Disp: , Rfl:  Allergies  Allergen Reactions  . Penicillins     Broke out in convulsions as a child  Did it involve swelling of the face/tongue/throat, SOB, or low BP? N/A Did it involve sudden or severe rash/hives, skin peeling, or any reaction on the inside of your mouth or nose? N/A Did you need to seek medical attention at a hospital or doctor's office?N/A When did it last happen?N/A If all above answers are "NO", may proceed with cephalosporin use.     Social History   Socioeconomic History  . Marital status: Single    Spouse name: Not on file  . Number of  children: Not on file  . Years of education: Not on file  . Highest education level: Not on file  Occupational History  . Not on file  Tobacco Use  . Smoking status: Never Smoker  . Smokeless tobacco: Never Used  Vaping Use  . Vaping Use: Never used  Substance and Sexual Activity  . Alcohol use: Not Currently  . Drug use: Not Currently    Types: Marijuana    Comment: Smoked for 30 years  . Sexual activity: Not on file  Other Topics Concern  . Not on file  Social History Narrative  . Not on file   Social Determinants of Health   Financial Resource Strain: Medium Risk  . Difficulty of Paying Living Expenses: Somewhat hard  Food Insecurity: No Food Insecurity  . Worried About Charity fundraiser in  the Last Year: Never true  . Ran Out of Food in the Last Year: Never true  Transportation Needs: Unmet Transportation Needs  . Lack of Transportation (Medical): Yes  . Lack of Transportation (Non-Medical): No  Physical Activity: Not on file  Stress: Not on file  Social Connections: Not on file  Intimate Partner Violence: Not on file    Physical Exam Vitals reviewed.  HENT:     Head: Normocephalic.     Nose: Nose normal.     Mouth/Throat:     Mouth: Mucous membranes are moist.     Pharynx: Oropharynx is clear.  Eyes:     Conjunctiva/sclera: Conjunctivae normal.     Pupils: Pupils are equal, round, and reactive to light.  Cardiovascular:     Rate and Rhythm: Normal rate and regular rhythm.     Pulses: Normal pulses.     Heart sounds: Normal heart sounds.  Pulmonary:     Effort: Pulmonary effort is normal.     Breath sounds: Normal breath sounds. No stridor. No wheezing, rhonchi or rales.  Abdominal:     Palpations: Abdomen is soft.  Musculoskeletal:        General: Swelling present. Normal range of motion.     Cervical back: Normal range of motion.     Right lower leg: Edema present.     Left lower leg: Edema present.  Skin:    General: Skin is warm and dry.      Capillary Refill: Capillary refill takes less than 2 seconds.  Neurological:     General: No focal deficit present.     Mental Status: He is alert. Mental status is at baseline.     Motor: Weakness present.  Psychiatric:        Mood and Affect: Mood normal.     Arrived for home visit for Jacquees who was alert and oriented reporting to be feeling poor. He stated his legs have gotten worse since being discharged from the hospital and he feels they are infected. I examined both lower legs. They were red, tender to touch, open wounds the size of a nickel with pus and blood leaking out. Both legs were warm to the touch. Pauline reports he had a low grade fever earlier reading 99.4, on exam his temp was 98.6. I obtained vitals and they are as noted. [name] weight is up 4 lbs since last visit. Teancum has been compliant with his medications. Laverne had infectious disease clinic send wound care clinic a referral. I called and spoke to the scheduler and they report their first aval. isnt until June 15th, and he would be put on a cancellation call list. She reports she will reach out to schedule Muhsin for appointment later today.  Attikus understood but was still frustrated by his leg pain and swelling. I instructed Cranston to keep his legs clean and dry and to elevate them as best he could because 90% of the day he has them hanging off the front of his reclining chair. He verbalized understanding and agreed to plan. Jalon was made aware to call me if any new or worsening symptoms arise. He agreed and stated he feels like he needs to go back to the hospital. I advised him that he needs to do what he feels is necessary however I recommended him following up with PCP and to wait for wound care clinic appointment. Emmerson will be seen in one week in the HF clinic and agreed with plan. He will  set up his own transportation. Home visit complete.   Refills:  NONE  Future Appointments  Date Time Provider [place]   09/10/2020 11:30 AM [facility] PA/NP [facility] None  10/01/2020  2:00 PM Michel Bickers, MD [facility]     ACTION: Home visit completed Next visit planned for one week

## 2020-09-06 ENCOUNTER — Emergency Department (HOSPITAL_COMMUNITY)
Admission: EM | Admit: 2020-09-06 | Discharge: 2020-09-07 | Disposition: A | Discharge status: Home / Self Care | Payer: Medicare HMO | Attending: Emergency Medicine | Admitting: Emergency Medicine

## 2020-09-06 ENCOUNTER — Encounter (HOSPITAL_COMMUNITY): Payer: Self-pay | Admitting: Emergency Medicine

## 2020-09-06 ENCOUNTER — Emergency Department (HOSPITAL_COMMUNITY): Payer: Medicare HMO

## 2020-09-06 ENCOUNTER — Other Ambulatory Visit: Payer: Self-pay

## 2020-09-06 DIAGNOSIS — I5021 Acute systolic (congestive) heart failure: Secondary | ICD-10-CM | POA: Diagnosis not present

## 2020-09-06 DIAGNOSIS — R202 Paresthesia of skin: Secondary | ICD-10-CM | POA: Diagnosis not present

## 2020-09-06 DIAGNOSIS — Z79899 Other long term (current) drug therapy: Secondary | ICD-10-CM | POA: Diagnosis not present

## 2020-09-06 DIAGNOSIS — R0602 Shortness of breath: Secondary | ICD-10-CM | POA: Insufficient documentation

## 2020-09-06 DIAGNOSIS — I5033 Acute on chronic diastolic (congestive) heart failure: Secondary | ICD-10-CM | POA: Diagnosis not present

## 2020-09-06 DIAGNOSIS — Z7901 Long term (current) use of anticoagulants: Secondary | ICD-10-CM | POA: Insufficient documentation

## 2020-09-06 DIAGNOSIS — R2243 Localized swelling, mass and lump, lower limb, bilateral: Secondary | ICD-10-CM | POA: Diagnosis present

## 2020-09-06 DIAGNOSIS — I4891 Unspecified atrial fibrillation: Secondary | ICD-10-CM | POA: Insufficient documentation

## 2020-09-06 DIAGNOSIS — I110 Hypertensive heart disease with heart failure: Secondary | ICD-10-CM | POA: Diagnosis not present

## 2020-09-06 DIAGNOSIS — I872 Venous insufficiency (chronic) (peripheral): Secondary | ICD-10-CM | POA: Insufficient documentation

## 2020-09-06 LAB — CBC WITH DIFFERENTIAL/PLATELET
Abs Immature Granulocytes: 0.03 10*3/uL (ref 0.00–0.07)
Basophils Absolute: 0 10*3/uL (ref 0.0–0.1)
Basophils Relative: 0 %
Eosinophils Absolute: 0.2 10*3/uL (ref 0.0–0.5)
Eosinophils Relative: 3 %
HCT: 36.9 % — ABNORMAL LOW (ref 39.0–52.0)
Hemoglobin: 11 g/dL — ABNORMAL LOW (ref 13.0–17.0)
Immature Granulocytes: 0 %
Lymphocytes Relative: 11 %
Lymphs Abs: 0.8 10*3/uL (ref 0.7–4.0)
MCH: 24.1 pg — ABNORMAL LOW (ref 26.0–34.0)
MCHC: 29.8 g/dL — ABNORMAL LOW (ref 30.0–36.0)
MCV: 80.7 fL (ref 80.0–100.0)
Monocytes Absolute: 0.6 10*3/uL (ref 0.1–1.0)
Monocytes Relative: 8 %
Neutro Abs: 5.7 10*3/uL (ref 1.7–7.7)
Neutrophils Relative %: 78 %
Platelets: 170 10*3/uL (ref 150–400)
RBC: 4.57 MIL/uL (ref 4.22–5.81)
RDW: 16.5 % — ABNORMAL HIGH (ref 11.5–15.5)
WBC: 7.4 10*3/uL (ref 4.0–10.5)
nRBC: 0 % (ref 0.0–0.2)

## 2020-09-06 LAB — BASIC METABOLIC PANEL
Anion gap: 7 (ref 5–15)
BUN: 15 mg/dL (ref 8–23)
CO2: 32 mmol/L (ref 22–32)
Calcium: 9 mg/dL (ref 8.9–10.3)
Chloride: 98 mmol/L (ref 98–111)
Creatinine, Ser: 0.94 mg/dL (ref 0.61–1.24)
GFR, Estimated: >60 mL/min (ref >60)
Glucose, Bld: 111 mg/dL — ABNORMAL HIGH (ref 70–99)
Potassium: 3.7 mmol/L (ref 3.5–5.1)
Sodium: 137 mmol/L (ref 135–145)

## 2020-09-06 LAB — BRAIN NATRIURETIC PEPTIDE: B Natriuretic Peptide: 83.6 pg/mL (ref 0.0–100.0)

## 2020-09-06 NOTE — ED Provider Notes (Signed)
Emergency Medicine Provider Triage Evaluation Note  Charles Gray , a 65 y.o. male  was evaluated in triage.  Pt complains of increased lower extremity swelling.  States his weight is up approximately 18 pounds from his baseline.  He has been compliant with his Lasix.  He is now having blisters for his lower extremities.  He has redness and warmth to his bilateral lower extremities.  Was previously hospitalized for cellulitis.  Mild shortness of breath.  No chest pain.  No cough.  Admits to PND and orthopnea. Sleeps in hospital bed at home straight up right.  Review of Systems  Positive: Lower extremity swelling, shortness of breath Negative: Chest pain  Physical Exam  BP 119/73 (BP Location: Left Arm)   Pulse 82   Temp 97.9 F (36.6 C) (Oral)   Resp 20   Ht 6' 10"  (2.083 m)   Wt (!) 185 kg   SpO2 95%   BMI 42.65 kg/m  Gen:   Awake, no distress   Resp:  Normal effort  MSK:   Moves extremities without difficulty.  3+ pitting edema to mid shins bilaterally.  No bony tenderness SKIN:  Redness, swelling, warmth bilateral lower extremities.  There are small early blistering lesions which appear to be early weeping lesions likely from edema. Other:    Medical Decision Making  Medically screening exam initiated at 9:53 PM.  Appropriate orders placed.  Lemoyne Nestor was informed that the remainder of the evaluation will be completed by another provider, this initial triage assessment does not replace that evaluation, and the importance of remaining in the ED until their evaluation is complete.  Lower extremity edema   Nautica Hotz A, PA-C 09/06/20 [id number]    Breck Coons, MD 09/06/20 [phone]

## 2020-09-06 NOTE — ED Triage Notes (Signed)
Patient reports increasing swelling at bilateral lower legs this week with SOB and weight gain .

## 2020-09-07 NOTE — Discharge Instructions (Signed)
Please wear compressive stockings regularly, keep your legs elevated above your heart while resting to help decrease leg swelling.  Continue taking your fluid pills and follow up closely with your primary care provider for further care.  Return if you have any concerns.

## 2020-09-07 NOTE — ED Notes (Signed)
Pt riding home via [facility] transport

## 2020-09-07 NOTE — ED Provider Notes (Signed)
[facility] EMERGENCY DEPARTMENT Provider Note   CSN: [id number] Arrival date & time: 09/06/20  2107     History Chief Complaint  Patient presents with  . Legs Swelling    Charles Gray is a 65 y.o. male.  The history is provided by the patient and medical records. No language interpreter was used.     65 year old male with hx of CHF, afib on Eliquis, HTN, cirrhosis, obesity presenting for c/o extremity swelling.  Pt report earlier in the month he was hospitalized for cellulitis of his legs.  He then f/u with an infectious disease specialist afterward but was told he did not have cellulitis, but more likely chronic venous stasis. He report his legs continues to hurt, described as tingling sensation throughout both legs and swelling.  Does endorse mild increase SOB and normally sleeps upright.  Report pain with his feet when ambulating.  Denies fever, cp.  Endorse fluid retention and gaining approximately 18 lbs for the past month.  He have been compliant with his medications including fluid pill.  He was worried about low potassium.  Denies covid sxs, denies fever.   Past Medical History:  Diagnosis Date  . Acute on chronic congestive heart failure ([place])   . Acute on chronic diastolic CHF (congestive heart failure) ([place]) 08/23/2019  . Acute on chronic right-sided heart failure ([place]) 08/06/2019  . Acute respiratory failure with hypoxia ([place])   . Anemia of chronic disease   . Atrial fibrillation ([place]) 12/23/2019  . Atrial tachycardia ([place]) 08/12/2019  . Benign essential HTN   . Chest pain 08/23/2019  . Chronic anticoagulation 12/26/2019  . Chronic heart failure with preserved EF 05/30/2019   Echo 07/2019: EF 55-60, GR 1 DD  . Chronic pain syndrome   . Cirrhosis of liver without ascites ([place])   . Class 2 severe obesity due to excess calories with serious comorbidity and body mass index (BMI) of 39.0 to 39.9 in adult ([place]) 05/30/2019  . Debility 03/13/2019  . Drug  induced constipation   . Dyspnea   . Fatty liver disease, nonalcoholic   . GIB (gastrointestinal bleeding) 03/04/2019  . Heart failure, systolic, acute ([place]) 6/50/3546  . History of pulmonary embolism 08/12/2019  . Hyperlipidemia 01/24/2019  . Hypertension 01/24/2019  . Hypoalbuminemia due to protein-calorie malnutrition ([place])   . Hypokalemia   . Hyponatremia   . Hypotension due to drugs   . Insomnia 01/24/2019  . Lactic acidosis 08/23/2019  . Lumbar disc herniation 01/24/2019  . Mixed hyperlipidemia 01/24/2019  . Morbid obesity with BMI of 40.0-44.9, adult ([place]) 05/30/2019  . Normocytic anemia 06/08/2019  . Occasional tremors 08/12/2019  . OSA (obstructive sleep apnea) 08/12/2019  . Palpitations 12/26/2019  . Peripheral edema   . Personal history of DVT (deep vein thrombosis) 12/26/2019  . Portal hypertensive gastropathy ([place]) 03/06/2019   Seen on EGD 03/06/2019  . Pulmonary emboli ([place]) 06/08/2019  . Pulmonary embolism ([place]) 06/08/2019  . Redness and swelling of lower leg 08/23/2019  . Shock ([place])   . Thrombocytopenia [facility])     Patient Active Problem List   Diagnosis Date Noted  . Chronic venous stasis dermatitis of both lower extremities 08/27/2020  . Cellulitis of both lower extremities 08/14/2020  . Microcytic anemia 08/14/2020  . GERD without esophagitis 06/10/2020  . Atrial fibrillation with rapid ventricular response ([place]) 03/24/2020  . Acute on chronic right heart failure ([place]) 01/27/2020  . Acute respiratory failure with hypoxia ([place])   . Fatty  liver disease, nonalcoholic   . Shock ([place])   . Personal history of DVT (deep vein thrombosis) 12/26/2019  . Chronic anticoagulation 12/26/2019  . Palpitations 12/26/2019  . AF (paroxysmal atrial fibrillation) ([place]) 12/23/2019  . Heart failure, systolic, acute ([place]) 16/01/9603  . Acute on chronic congestive heart failure ([place])   . Acute on chronic diastolic CHF (congestive heart failure) ([place]) 08/23/2019  . Chest pain 08/23/2019   . Lactic acidosis 08/23/2019  . History of pulmonary embolism 08/12/2019  . Atrial tachycardia ([place]) 08/12/2019  . Occasional tremors 08/12/2019  . OSA (obstructive sleep apnea) 08/12/2019  . Acute on chronic right-sided heart failure ([place]) 08/06/2019  . Pulmonary embolism ([place]) 06/08/2019  . Normocytic anemia 06/08/2019  . Pulmonary emboli ([place]) 06/08/2019  . Chronic heart failure with preserved ejection fraction ([place]) 05/30/2019  . Class 2 severe obesity due to excess calories with serious comorbidity and body mass index (BMI) of 39.0 to 39.9 in adult ([place]) 05/30/2019  . Morbid obesity with BMI of 40.0-44.9, adult ([place]) 05/30/2019  . Hyponatremia   . Drug induced constipation   . Peripheral edema   . Hypotension due to drugs   . Hypoalbuminemia due to protein-calorie malnutrition ([place])   . Hypokalemia   . Thrombocytopenia ([place])   . Anemia of chronic disease   . Cirrhosis of liver without ascites ([place])   . Chronic pain syndrome   . Debility 03/13/2019  . Portal hypertensive gastropathy ([place]) 03/06/2019  . Dyspnea   . GIB (gastrointestinal bleeding) 03/04/2019  . Mixed hyperlipidemia 01/24/2019  . Insomnia 01/24/2019  . Hyperlipidemia 01/24/2019  . Essential hypertension 01/24/2019  . Lumbar disc herniation 01/24/2019    Past Surgical History:  Procedure Laterality Date  . ESOPHAGOGASTRODUODENOSCOPY (EGD) WITH PROPOFOL N/A 03/05/2019   Procedure: ESOPHAGOGASTRODUODENOSCOPY (EGD) WITH PROPOFOL;  Surgeon: Doran Stabler, MD;  Location: [place];  Service: Gastroenterology;  Laterality: N/A;  Large Blood Clot Removed  . ESOPHAGOGASTRODUODENOSCOPY (EGD) WITH PROPOFOL N/A 03/06/2019   Procedure: ESOPHAGOGASTRODUODENOSCOPY (EGD) WITH PROPOFOL;  Surgeon: Thornton Park, MD;  Location: [place];  Service: Gastroenterology;  Laterality: N/A;  . HEMORROIDECTOMY    . RIGHT HEART CATH N/A 08/09/2019   Procedure: RIGHT HEART CATH;  Surgeon: Larey Dresser, MD;  Location:  [place] [facility];  Service: Cardiovascular;  Laterality: N/A;  . RIGHT/LEFT HEART CATH AND CORONARY ANGIOGRAPHY N/A 05/10/2020   Procedure: RIGHT/LEFT HEART CATH AND CORONARY ANGIOGRAPHY;  Surgeon: Larey Dresser, MD;  Location: [place] [facility];  Service: Cardiovascular;  Laterality: N/A;       Family History  Problem Relation Age of Onset  . Hyperlipidemia Mother   . Hypertension Mother   . Stroke Mother   . CAD Maternal Grandmother   . CAD Maternal Grandfather     Social History   Tobacco Use  . Smoking status: Never Smoker  . Smokeless tobacco: Never Used  Vaping Use  . Vaping Use: Never used  Substance Use Topics  . Alcohol use: Not Currently  . Drug use: Not Currently    Types: Marijuana    Comment: Smoked for 30 years    Home Medications Prior to Admission medications   Medication Sig Start Date End Date Taking? Authorizing Provider  acetaminophen (TYLENOL) 500 MG tablet Take 1,000 mg by mouth every 6 (six) hours as needed for moderate pain or headache.    [provider]  apixaban (ELIQUIS) 5 MG TABS tablet Take 1 tablet (5 mg total) by mouth 2 (two)  times daily. 08/12/19   Strader, Fransisco Hertz, PA-C  Buprenorphine HCl-Naloxone HCl 8-2 MG FILM Place 1 tablet under the tongue in the morning and at bedtime.     [provider]  docusate sodium (COLACE) 100 MG capsule Take 1 capsule (100 mg total) by mouth daily as needed for mild constipation or moderate constipation. Patient not taking: Reported on 09/03/2020 07/02/20   [name] D, NP  eplerenone (INSPRA) 50 MG tablet Take 2 tablets (100 mg total) by mouth daily. 06/25/20   Clegg, [name] D, NP  gabapentin (NEURONTIN) 800 MG tablet Take 1 tablet (800 mg total) by mouth 3 (three) times daily. 03/23/19   Angiulli, Lavon Paganini, PA-C  metolazone (ZAROXOLYN) 2.5 MG tablet Take 1 tablet (2.5 mg total) by mouth 2 (two) times a week. Every Monday and Friday 09/02/20   Larey Dresser, MD  metoprolol succinate  (TOPROL XL) 25 MG 24 hr tablet Take 1 tablet (25 mg total) by mouth 2 (two) times daily. 06/25/20 09/23/20  Larey Dresser, MD  ondansetron (ZOFRAN ODT) 4 MG disintegrating tablet Take 1 tablet (4 mg total) by mouth every 8 (eight) hours as needed for nausea or vomiting. 04/24/20   Larey Dresser, MD  pantoprazole (PROTONIX) 40 MG tablet Take 40 mg by mouth 2 (two) times daily.    [provider]  polyethylene glycol powder (GLYCOLAX/MIRALAX) 17 GM/SCOOP powder Take 17 g by mouth daily as needed for mild constipation.    [provider]  potassium chloride SA (KLOR-CON) 20 MEQ tablet Take 2 tablets (40 mEq total) by mouth 4 (four) times daily for 1 day. Then resume 40 meq in AM and 20 meq in PM 08/21/20 08/22/20  Mariel Aloe, MD  pravastatin (PRAVACHOL) 20 MG tablet Take 1 tablet (20 mg total) by mouth daily. 03/23/19   Angiulli, Lavon Paganini, PA-C  senna (SENOKOT) 8.6 MG TABS tablet Take 2 tablets (17.2 mg total) by mouth daily. Patient not taking: Reported on 09/03/2020 06/13/20   [place] Donnamarie Poag, DO  sertraline (ZOLOFT) 25 MG tablet Take 1 tablet (25 mg total) by mouth daily. Patient taking differently: Take 12.5 mg by mouth daily. 06/25/20   Larey Dresser, MD  sodium chloride (OCEAN) 0.65 % SOLN nasal spray Place 1 spray into both nostrils as needed for congestion.    [provider]  torsemide (DEMADEX) 20 MG tablet Take 4 tablets (80 mg total) by mouth every morning AND 4 tablets (80 mg total) every evening. 08/30/20   Larey Dresser, MD  traMADol (ULTRAM) 50 MG tablet Take 1 tablet (50 mg total) by mouth every 6 (six) hours as needed for moderate pain. Patient not taking: Reported on 09/03/2020 08/20/20   Hosie Poisson, MD  VENTOLIN HFA 108 (90 Base) MCG/ACT inhaler Inhale 1 puff into the lungs every 4 (four) hours as needed for shortness of breath. 03/23/19   Angiulli, Lavon Paganini, PA-C  zolpidem (AMBIEN) 10 MG tablet Take 10 mg by mouth at bedtime. 05/19/19   [provider]  bumetanide (BUMEX) 1 MG tablet Take 4 tablets (4 mg total) by mouth daily with breakfast AND 3 tablets (3 mg total) every evening. 05/10/20 06/13/20  Larey Dresser, MD  metoprolol tartrate (LOPRESSOR) 50 MG tablet Take 1 tablet (50 mg total) by mouth 2 (two) times daily. 02/09/20 04/11/20  Mercy Riding, MD  spironolactone (ALDACTONE) 100 MG tablet Take 1 tablet (100 mg total) by mouth daily. 02/10/20 02/27/20  Cyndia Skeeters,  Charlesetta Ivory, MD    Allergies    Penicillins  Review of Systems   Review of Systems  All other systems reviewed and are negative.   Physical Exam Updated Vital Signs BP 132/82 (BP Location: Left Arm)   Pulse 71   Temp 98.4 F (36.9 C) (Oral)   Resp 13   Ht 6' 10"  (2.083 m)   Wt (!) 185 kg   SpO2 98%   BMI 42.65 kg/m   Physical Exam Vitals and nursing note reviewed.  Constitutional:      General: He is not in acute distress.    Appearance: He is well-developed. He is obese.     Comments: Sitting in bed, appears non toxic and in NAD  HENT:     Head: Atraumatic.  Eyes:     Conjunctiva/sclera: Conjunctivae normal.  Cardiovascular:     Rate and Rhythm: Rhythm irregular.     Pulses: Normal pulses.     Heart sounds: Normal heart sounds.  Pulmonary:     Effort: Pulmonary effort is normal.     Breath sounds: Normal breath sounds. No wheezing, rhonchi or rales.  Abdominal:     Palpations: Abdomen is soft.  Musculoskeletal:     Cervical back: Neck supple.     Right lower leg: Edema present.     Left lower leg: Edema present.     Comments: 3+ pitting edema to BLE extending towards the knees with chronic venous stasis skin changes without obvious cellulitis.  Intact pedal pulses  Skin:    Findings: No rash.  Neurological:     Mental Status: He is alert. Mental status is at baseline.  Psychiatric:        Mood and Affect: Mood normal.     ED Results / Procedures / Treatments   Labs (all labs ordered are listed, but only abnormal results are  displayed) Labs Reviewed  CBC WITH DIFFERENTIAL/PLATELET - Abnormal; Notable for the following components:      Result Value   Hemoglobin 11.0 (*)    HCT 36.9 (*)    MCH 24.1 (*)    MCHC 29.8 (*)    RDW 16.5 (*)    All other components within normal limits  BASIC METABOLIC PANEL - Abnormal; Notable for the following components:   Glucose, Bld 111 (*)    All other components within normal limits  BRAIN NATRIURETIC PEPTIDE    EKG None  ED ECG REPORT   Date: 09/07/2020  Rate: 80  Rhythm: accelerated junctional rhythm  QRS Axis: left  Intervals: normal  ST/T Wave abnormalities: normal  Conduction Disutrbances:left anterior fascicular block  Narrative Interpretation:   Old EKG Reviewed: unchanged  I have personally reviewed the EKG tracing and agree with the computerized printout as noted.   Radiology DG Chest 2 View  Result Date: 09/06/2020 CLINICAL DATA:  Shortness of breath and lower extremity edema EXAM: CHEST - 2 VIEW COMPARISON:  06/09/2020, 05/07/2020, 09/25/2019 FINDINGS: Chronic bronchitic changes in the lower lungs. No consolidation, pleural effusion or pneumothorax. Normal cardiomediastinal silhouette. No pneumothorax. IMPRESSION: No active cardiopulmonary disease. Electronically Signed   By: Donavan Foil M.D.   On: 09/06/2020 22:39    Procedures Procedures   Medications Ordered in ED Medications - No data to display  ED Course  I have reviewed the triage vital signs and the nursing notes.  Pertinent labs & imaging results that were available during my care of the patient were reviewed by me and considered in my medical  decision making (see chart for details).    MDM Rules/Calculators/A&P                          BP 102/65 (BP Location: Left Arm)   Pulse 69   Temp 97.7 F (36.5 C) (Oral)   Resp 13   Ht 6' 10"  (2.083 m)   Wt (!) 185 kg   SpO2 98%   BMI 42.65 kg/m   Final Clinical Impression(s) / ED Diagnoses Final diagnoses:  Chronic venous  stasis dermatitis of both lower extremities    Rx / DC Orders ED Discharge Orders    None     9:20 AM Pt voice concerns of leg swelling and also confused if he's having cellulitis or chronic venous stasis because he was admitted earlier in the month for cellulitis, treats with abx and f/u with ID who said it's unlikely that he has cellulitis, but more likely venous stasis.  I agree that his skin changes to both legs are more consistent with venous stasis.  I doubt DVT.  His labs are reassuring, CXR unremarkable, no hypoxia.  No fever or leukocytosis. Normal BNP. Does not appears to be in CHF exacerbation requiring hospital admission at this time.  Strongly encourage pt to wear compressive socks, keep legs elevated and also f/u closely with PCP for further management. Return precaution given.    Domenic Moras, PA-C 09/07/20 1586    Horton, Alvin Critchley, DO 09/07/20 1521

## 2020-09-09 NOTE — Progress Notes (Signed)
[facility]  NOTE  Primary Care: Enid Skeens., MD Primary Cardiologist: Dr. Berniece Salines, DO  HF Cardiology: Dr. [name]  HPI: 65 y.o. 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    He has had multiple hospitalizations in 2021 for CHF exacerbation, requiring IV Lasix and change in PO regimen from lasix to torsemide. Echo 4/21 showed normal LVEF 55-60% w/ G1DD. RV not well visualized, but felt to have component of RV failure. [place] 4/21 showed normal PA pressure and normal cardiac output. PFTs also completed and c/w mild obstructive airway disease.   In 7/21, he had a coronary CTA.  This showed no coronary calcium but there was a concern for soft plaque in the distal LCx and distal RCA with hemodynamically significant stenosis.  He was managed medically. He has not had any chest pain, but was started on ranolazine after his CTA.  He feels like it makes his "head fuzzy."    Admitted with A/C diastolic heart failure. Diuresed with IV lasix and transitioned to bumex 3 mg twice a day. Discharge weight 360 pounds. Discharged 02/09/2020.   He was admitted again in 12/21 with CHF.  Echo in 12/21 showed EF 94-17%, grade 2 diastolic dysfunction, RV poorly visualized.   OV on 12/21 still having significant exertional dyspnea, short of breath just walking around the house.  + Orthopnea, has to sleep on the sofa.  Sleeps poorly, cannot tolerate CPAP.  Cardiomems was discussed but patient reluctant, Jardiance added, metolazone increased to 2 x/week, metoprolol stopped, and Toprol XL 25 mg daily started.  Admitted 07/19/12 with A/C diastolic heart failure. Diuresed with IV lasix + metolazone. Transitioned to torsemide 60 mg twice a day and metolazone twice a day. D/C weight 364 pounds.   Admitted 5/28//22 with cellulitis. Treated with antibiotics.   Today he returns for HF follow up. Yesterday he had  palpitations and EMS checked his EKG. EKG showed SR with rate in the 70s. Overall feeling just ok. Having ongoing leg swelling. SOB with exertion.  Denies PND/Orthopnea. Appetite ok. No fever or chills. Weight at home 373-381 pounds. Taking all medications. Meds are set up in a pill bo by HF Paramedicine. Followed cosely by HF Paramedicine. Requires assistance with transportation. Lives alone. Requires transportation assistance.    Labs (6/21): K 4.3, creatinine 1.03 Labs (10/21): LDL 73 Labs (12/21): K 3.8, creatinine 0.98 Labs 06/13/20: K 3.1 Creatinine 0.9  Labs 09/06/20: K 3.7 Creatinine 0.94   PMH: 1. HTN 2. OSA 3. Hyperlipidemia 4. Low back pain 5. PE/DVT in 2/21.  - V/Q scan in 6/21 was not suggestive of chronic PE.  6. Nonalcoholic steatohepatitis.  7. GI bleed in 10/20.  8. Anxiety 9. CAD: Coronary CTA in 7/21 with calcium score 0 but concern for soft plaque distal CFX and RCA.  By [facility], there was flow-limiting stenosis in distal CFX and distal RCA.  10. Chronic diastolic CHF: Echo in 4/81 with EF 55-60%, RV poorly visualized.  - RHC (4/21): mean RA 8, PA 20/8, mean PCWP 11, CI 2.8 - Echo (12/21):EF 85-63%, grade 2 diastolic dysfunction, RV poorly visualized.   11. PFTs (4/21): Mild obstruction.  12. ABIs (10/21): Normal.  Review of Systems: All systems reviewed and negative except as per HPI.    Current Outpatient Medications  Medication Sig Dispense Refill  . acetaminophen (TYLENOL) 500 MG tablet Take 1,000 mg by mouth every 6 (six) hours  as needed for moderate pain or headache.    Marland Kitchen apixaban (ELIQUIS) 5 MG TABS tablet Take 1 tablet (5 mg total) by mouth 2 (two) times daily. 60 tablet 0  . Buprenorphine HCl-Naloxone HCl 8-2 MG FILM Place 1 tablet under the tongue in the morning and at bedtime.     . docusate sodium (COLACE) 100 MG capsule Take 1 capsule (100 mg total) by mouth daily as needed for mild constipation or moderate constipation. 30 capsule 2  . eplerenone (INSPRA)  50 MG tablet Take 2 tablets (100 mg total) by mouth daily. 60 tablet 3  . gabapentin (NEURONTIN) 800 MG tablet Take 1 tablet (800 mg total) by mouth 3 (three) times daily. 90 tablet 1  . metolazone (ZAROXOLYN) 2.5 MG tablet Take 2.5 mg by mouth once a week. Fridays    . metoprolol succinate (TOPROL XL) 25 MG 24 hr tablet Take 1 tablet (25 mg total) by mouth 2 (two) times daily. 180 tablet 3  . ondansetron (ZOFRAN ODT) 4 MG disintegrating tablet Take 1 tablet (4 mg total) by mouth every 8 (eight) hours as needed for nausea or vomiting. 20 tablet 0  . pantoprazole (PROTONIX) 40 MG tablet Take 40 mg by mouth 2 (two) times daily.    . polyethylene glycol powder (GLYCOLAX/MIRALAX) 17 GM/SCOOP powder Take 17 g by mouth daily as needed for mild constipation.    . potassium chloride SA (KLOR-CON) 20 MEQ tablet Take 40 mEq by mouth 2 (two) times daily. 40 meq in AM and 20 meq in PM    . pravastatin (PRAVACHOL) 20 MG tablet Take 1 tablet (20 mg total) by mouth daily. 30 tablet 1  . senna (SENOKOT) 8.6 MG TABS tablet Take 2 tablets (17.2 mg total) by mouth daily. 120 tablet 0  . sertraline (ZOLOFT) 25 MG tablet Take 1 tablet (25 mg total) by mouth daily. (Patient taking differently: Take 12.5 mg by mouth daily.) 30 tablet 0  . sodium chloride (OCEAN) 0.65 % SOLN nasal spray Place 1 spray into both nostrils as needed for congestion.    . torsemide (DEMADEX) 20 MG tablet Take 4 tablets (80 mg total) by mouth every morning AND 4 tablets (80 mg total) every evening. 210 tablet 11  . traMADol (ULTRAM) 50 MG tablet Take 1 tablet (50 mg total) by mouth every 6 (six) hours as needed for moderate pain. 12 tablet 0  . VENTOLIN HFA 108 (90 Base) MCG/ACT inhaler Inhale 1 puff into the lungs every 4 (four) hours as needed for shortness of breath. 6.7 g 1  . zolpidem (AMBIEN) 10 MG tablet Take 10 mg by mouth at bedtime.     No current facility-administered medications for this encounter.   Allergies  Allergen Reactions   . Penicillins     Broke out in convulsions as a child  Did it involve swelling of the face/tongue/throat, SOB, or low BP? N/A Did it involve sudden or severe rash/hives, skin peeling, or any reaction on the inside of your mouth or nose? N/A Did you need to seek medical attention at a hospital or doctor's office?N/A When did it last happen?N/A If all above answers are "NO", may proceed with cephalosporin use.   Social History   Socioeconomic History  . Marital status: Single    Spouse name: Not on file  . Number of children: Not on file  . Years of education: Not on file  . Highest education level: Not on file  Occupational History  . Not  on file  Tobacco Use  . Smoking status: Never Smoker  . Smokeless tobacco: Never Used  Vaping Use  . Vaping Use: Never used  Substance and Sexual Activity  . Alcohol use: Not Currently  . Drug use: Not Currently    Types: Marijuana    Comment: Smoked for 30 years  . Sexual activity: Not on file  Other Topics Concern  . Not on file  Social History Narrative  . Not on file   Social Determinants of Health   Financial Resource Strain: Medium Risk  . Difficulty of Paying Living Expenses: Somewhat hard  Food Insecurity: No Food Insecurity  . Worried About Charity fundraiser in the Last Year: Never true  . Ran Out of Food in the Last Year: Never true  Transportation Needs: Unmet Transportation Needs  . Lack of Transportation (Medical): Yes  . Lack of Transportation (Non-Medical): No  Physical Activity: Not on file  Stress: Not on file  Social Connections: Not on file  Intimate Partner Violence: Not on file    Family History  Problem Relation Age of Onset  . Hyperlipidemia Mother   . Hypertension Mother   . Stroke Mother   . CAD Maternal Grandmother   . CAD Maternal Grandfather     Vitals:   09/10/20 1117  BP: 120/78  Pulse: 80  SpO2: 95%  Weight: (!) 169.5 kg (373 lb 9.6 oz)     Wt Readings from Last 3 Encounters:   09/10/20 (!) 169.5 kg (373 lb 9.6 oz)  09/06/20 (!) 185 kg (407 lb 13.6 oz)  09/03/20 (!) 168.7 kg (372 lb)    PHYSICAL EXAM: General:  . No resp difficulty. Arrived in a wheelchair.  HEENT: normal Neck: supple. no JVD. Carotids 2+ bilat; no bruits. No lymphadenopathy or thryomegaly appreciated. Cor: PMI nondisplaced. Regular rate & rhythm. No rubs, gallops or murmurs. Lungs: clear Abdomen: soft, nontender, nondistended. No hepatosplenomegaly. No bruits or masses. Good bowel sounds. Extremities: no cyanosis, clubbing, rash, R and LLE 1-2+ edema. L>R Neuro: alert & orientedx3, cranial nerves grossly intact. moves all 4 extremities w/o difficulty. Affect pleasant  ASSESSMENT & PLAN: 1. Chronic diastolic CHF: Echo in 50/93 with EF 26-71%, grade 2 diastolic dysfunction, RV poorly visualized.  [place] in 4/21 actually showed optimized filling pressures and no pulmonary hypertension.  Patient has had multiple admissions this year with CHF.   - Ideally, would have Cardiomems but he declines.   NYHA III chronically.  - He did not tolerate jardiance due to GI upset.  - Continue torsemide 80 mg in am and 80 mg pm and asked to take an extra 20 mg of torsemide today.  - Continue eplerenone 100 mg daily + potassium 40/20 meq. - Continue metolazone weekly and as needed.   -2. H/o PE/DVT: Suspect due to sedentary lifestyle.  Occurred in 2/21.  V/Q scan in 6/21 did not show evidence for chronic PE.   - Continue apixaban.  - No bleeding issues. 3. OSA: Cannot tolerate CPAP.  4. Anxiety/panic:  - Continue sertraline 25 mg daily.  5. CAD: Coronary CTA showed suspected soft plaque distal RCA and distal LCX that is hemodynamically significant by FFR, but coronary calcium score surprisingly is 0.  - No chest pain.    - Continue statin, LDL 73 in 10/21.  6. Hypertension: Stable.  - Continue Toprol XL 25 mg daily (felt metoprolol 50 mg bid made him lightheaded). 7. Atrial fibrillation: Paroxysmal. Regular on  exam.  -  Continue apixaban. No bleeding issues.  8. Right leg swelling: resolving, LE dopplers (1/22) negative for DVT. Completed antibiotic course earlier this month. . 9. Morbid Obesity: Body mass index is 39.06 kg/m.  10. Palpitations Place Zio Patch for 7 days to assess for arrhythmias.    Follow up in 4 weeks to   [place] NP-C  09/10/2020

## 2020-09-10 ENCOUNTER — Ambulatory Visit (HOSPITAL_COMMUNITY)
Admission: RE | Admit: 2020-09-10 | Discharge: 2020-09-10 | Disposition: A | Discharge status: Home / Self Care | Payer: Medicare HMO | Source: Ambulatory Visit | Attending: Cardiology | Admitting: Cardiology

## 2020-09-10 ENCOUNTER — Encounter (HOSPITAL_COMMUNITY): Payer: Self-pay

## 2020-09-10 ENCOUNTER — Other Ambulatory Visit: Payer: Self-pay

## 2020-09-10 ENCOUNTER — Other Ambulatory Visit (HOSPITAL_COMMUNITY): Payer: Self-pay

## 2020-09-10 ENCOUNTER — Telehealth (HOSPITAL_COMMUNITY): Payer: Self-pay

## 2020-09-10 ENCOUNTER — Ambulatory Visit (HOSPITAL_COMMUNITY)
Admission: RE | Admit: 2020-09-10 | Discharge: 2020-09-10 | Disposition: A | Discharge status: Home / Self Care | Payer: Medicare HMO | Source: Ambulatory Visit | Attending: Adult Health | Admitting: Adult Health

## 2020-09-10 VITALS — BP 120/78 | HR 80 | Wt 373.6 lb

## 2020-09-10 DIAGNOSIS — I480 Paroxysmal atrial fibrillation: Secondary | ICD-10-CM

## 2020-09-10 DIAGNOSIS — I2510 Atherosclerotic heart disease of native coronary artery without angina pectoris: Secondary | ICD-10-CM | POA: Diagnosis not present

## 2020-09-10 DIAGNOSIS — F419 Anxiety disorder, unspecified: Secondary | ICD-10-CM | POA: Diagnosis not present

## 2020-09-10 DIAGNOSIS — I5033 Acute on chronic diastolic (congestive) heart failure: Secondary | ICD-10-CM | POA: Insufficient documentation

## 2020-09-10 DIAGNOSIS — Z8249 Family history of ischemic heart disease and other diseases of the circulatory system: Secondary | ICD-10-CM | POA: Diagnosis not present

## 2020-09-10 DIAGNOSIS — R002 Palpitations: Secondary | ICD-10-CM

## 2020-09-10 DIAGNOSIS — Z86711 Personal history of pulmonary embolism: Secondary | ICD-10-CM | POA: Diagnosis not present

## 2020-09-10 DIAGNOSIS — I5032 Chronic diastolic (congestive) heart failure: Secondary | ICD-10-CM

## 2020-09-10 DIAGNOSIS — I110 Hypertensive heart disease with heart failure: Secondary | ICD-10-CM | POA: Diagnosis not present

## 2020-09-10 DIAGNOSIS — Z79899 Other long term (current) drug therapy: Secondary | ICD-10-CM | POA: Insufficient documentation

## 2020-09-10 DIAGNOSIS — G4733 Obstructive sleep apnea (adult) (pediatric): Secondary | ICD-10-CM | POA: Diagnosis not present

## 2020-09-10 DIAGNOSIS — Z6839 Body mass index (BMI) 39.0-39.9, adult: Secondary | ICD-10-CM | POA: Insufficient documentation

## 2020-09-10 DIAGNOSIS — Z86718 Personal history of other venous thrombosis and embolism: Secondary | ICD-10-CM | POA: Diagnosis not present

## 2020-09-10 DIAGNOSIS — M7989 Other specified soft tissue disorders: Secondary | ICD-10-CM | POA: Insufficient documentation

## 2020-09-10 DIAGNOSIS — Z7901 Long term (current) use of anticoagulants: Secondary | ICD-10-CM | POA: Diagnosis not present

## 2020-09-10 DIAGNOSIS — J449 Chronic obstructive pulmonary disease, unspecified: Secondary | ICD-10-CM | POA: Insufficient documentation

## 2020-09-10 LAB — BASIC METABOLIC PANEL
Anion gap: 11 (ref 5–15)
BUN: 15 mg/dL (ref 8–23)
CO2: 34 mmol/L — ABNORMAL HIGH (ref 22–32)
Calcium: 9.1 mg/dL (ref 8.9–10.3)
Chloride: 88 mmol/L — ABNORMAL LOW (ref 98–111)
Creatinine, Ser: 1.01 mg/dL (ref 0.61–1.24)
GFR, Estimated: >60 mL/min (ref >60)
Glucose, Bld: 115 mg/dL — ABNORMAL HIGH (ref 70–99)
Potassium: 3.5 mmol/L (ref 3.5–5.1)
Sodium: 133 mmol/L — ABNORMAL LOW (ref 135–145)

## 2020-09-10 LAB — CBC
HCT: 40.6 % (ref 39.0–52.0)
Hemoglobin: 12.3 g/dL — ABNORMAL LOW (ref 13.0–17.0)
MCH: 24.1 pg — ABNORMAL LOW (ref 26.0–34.0)
MCHC: 30.3 g/dL (ref 30.0–36.0)
MCV: 79.5 fL — ABNORMAL LOW (ref 80.0–100.0)
Platelets: 188 10*3/uL (ref 150–400)
RBC: 5.11 MIL/uL (ref 4.22–5.81)
RDW: 16 % — ABNORMAL HIGH (ref 11.5–15.5)
WBC: 8.3 10*3/uL (ref 4.0–10.5)
nRBC: 0 % (ref 0.0–0.2)

## 2020-09-10 LAB — BRAIN NATRIURETIC PEPTIDE: B Natriuretic Peptide: 20.7 pg/mL (ref 0.0–100.0)

## 2020-09-10 MED ORDER — POTASSIUM CHLORIDE CRYS ER 20 MEQ PO TBCR: 40.0000 meq | EXTENDED_RELEASE_TABLET | Freq: Two times a day (BID) | ORAL | 30 refills | Status: DC

## 2020-09-10 NOTE — Patient Instructions (Addendum)
Take an extra dose of Toresmide (five tabs total) today  INCREASE Potassium to 40 mg, twice a day  Labs today We will only contact you if something comes back abnormal or we need to make some changes. Otherwise no news is good news!  Your physician recommends that you schedule a follow-up appointment in: 4 weeks  Your provider has recommended that  you wear a Zio Patch for 6 days.  This monitor will record your heart rhythm for our review.  IF you have any symptoms while wearing the monitor please press the button.  If you have any issues with the patch or you notice a red or orange light on it please call the company at [phone].  Once you remove the patch please mail it back to the company as soon as possible so we can get the results.   At the [facility], you and your health needs are our priority. As part of our continuing mission to provide you with exceptional heart care, we have created designated Provider Care Teams. These Care Teams include your primary Cardiologist (physician) and Advanced Practice Providers (APPs- Physician Assistants and Nurse Practitioners) who all work together to provide you with the care you need, when you need it.   You may see any of the following providers on your designated Care Team at your next follow up: Marland Kitchen Dr Glori Bickers . Dr Loralie Champagne . Dr Vickki Muff . [name], NP . Lyda Jester, [place] . Audry Riles, PharmD   Please be sure to bring in all your medications bottles to every appointment.

## 2020-09-10 NOTE — Progress Notes (Signed)
Paramedicine Encounter    Patient ID: Charles Gray, male    DOB: [birth year], 65 y.o.   MRN: [id number]  Charles Gray in clinic today with Charles Grinder NP who evaluated Charles Gray and reviewed his chart and advised him the status of his lower legs looked okay on exam and that it is best if he follows up with wound care clinic and infectious disease. I will ensure he follows up. We reviewed medications and changes are as noted:  -Potassium 40Meq AM and PM  -Torsemide 191m today return back to 836mAM and PM tomorrow  -Metolazone 2.54m11mvery Friday with one extra Potassium   Zio Patch placed today due to reoccurent flutter in chest with cold sweats and increased heart rate.   Labs drawn today.   I will see Charles Gray in the home in one week.   Refills: Eliquis Gabapentin Potassium        ACTION: Home visit completed

## 2020-09-10 NOTE — Telephone Encounter (Signed)
Received multiple text messages from Charles Gray throughout the day with Charles Gray having complaints of high heart rate of 148, weight up to 381lbs, swollen legs, abdomen and shortness of breath with diaphoresis. He had [facility] come to his home to evaluate Charles Gray and they completed an EKG which showed Sinus Arrhythmia. His vitals were normotensive per [facility]   BP- 128/70 HR- 79 O2- 96%   I explained to Charles Gray to ensure he took his medications, rested and tried to avoid over hydrating and eating salty foods. Charles Gray verbalized understanding however mentioned Charles Gray wanting to go back to the ER, as he went over the weekend and they saw no reason for admission. I reminded Charles Gray of his appointment with the clinic tomorrow. He agreed. Calls/texts complete.

## 2020-09-17 ENCOUNTER — Other Ambulatory Visit (HOSPITAL_COMMUNITY): Payer: Self-pay

## 2020-09-17 NOTE — Progress Notes (Signed)
Paramedicine Encounter    Patient ID: Charles Gray, male    DOB: [birth year], 65 y.o.   MRN: [id number]   Patient Care Team: Enid Skeens., MD as PCP - General (Family Medicine) Berniece Salines, DO as PCP - Cardiology (Cardiology) Jorge Ny, LCSW as Social Worker (Licensed Clinical Social Worker)  Patient Active Problem List   Diagnosis Date Noted  . Chronic venous stasis dermatitis of both lower extremities 08/27/2020  . Cellulitis of both lower extremities 08/14/2020  . Microcytic anemia 08/14/2020  . GERD without esophagitis 06/10/2020  . Atrial fibrillation with rapid ventricular response ([place]) 03/24/2020  . Acute on chronic right heart failure ([place]) 01/27/2020  . Acute respiratory failure with hypoxia ([place])   . Fatty liver disease, nonalcoholic   . Shock ([place])   . Personal history of DVT (deep vein thrombosis) 12/26/2019  . Chronic anticoagulation 12/26/2019  . Palpitations 12/26/2019  . AF (paroxysmal atrial fibrillation) ([place]) 12/23/2019  . Heart failure, systolic, acute ([place]) 00/37/0488  . Acute on chronic congestive heart failure ([place])   . Acute on chronic diastolic CHF (congestive heart failure) ([place]) 08/23/2019  . Chest pain 08/23/2019  . Lactic acidosis 08/23/2019  . History of pulmonary embolism 08/12/2019  . Atrial tachycardia ([place]) 08/12/2019  . Occasional tremors 08/12/2019  . OSA (obstructive sleep apnea) 08/12/2019  . Acute on chronic right-sided heart failure ([place]) 08/06/2019  . Pulmonary embolism ([place]) 06/08/2019  . Normocytic anemia 06/08/2019  . Pulmonary emboli ([place]) 06/08/2019  . Chronic heart failure with preserved ejection fraction ([place]) 05/30/2019  . Class 2 severe obesity due to excess calories with serious comorbidity and body mass index (BMI) of 39.0 to 39.9 in adult ([place]) 05/30/2019  . Morbid obesity with BMI of 40.0-44.9, adult ([place]) 05/30/2019  . Hyponatremia   . Drug induced constipation   . Peripheral edema   . Hypotension due to  drugs   . Hypoalbuminemia due to protein-calorie malnutrition ([place])   . Hypokalemia   . Thrombocytopenia ([place])   . Anemia of chronic disease   . Cirrhosis of liver without ascites ([place])   . Chronic pain syndrome   . Debility 03/13/2019  . Portal hypertensive gastropathy ([place]) 03/06/2019  . Dyspnea   . GIB (gastrointestinal bleeding) 03/04/2019  . Mixed hyperlipidemia 01/24/2019  . Insomnia 01/24/2019  . Hyperlipidemia 01/24/2019  . Essential hypertension 01/24/2019  . Lumbar disc herniation 01/24/2019    Current Outpatient Medications:  .  acetaminophen (TYLENOL) 500 MG tablet, Take 1,000 mg by mouth every 6 (six) hours as needed for moderate pain or headache., Disp: , Rfl:  .  apixaban (ELIQUIS) 5 MG TABS tablet, Take 1 tablet (5 mg total) by mouth 2 (two) times daily., Disp: 60 tablet, Rfl: 0 .  Buprenorphine HCl-Naloxone HCl 8-2 MG FILM, Place 1 tablet under the tongue in the morning and at bedtime. , Disp: , Rfl:  .  docusate sodium (COLACE) 100 MG capsule, Take 1 capsule (100 mg total) by mouth daily as needed for mild constipation or moderate constipation., Disp: 30 capsule, Rfl: 2 .  eplerenone (INSPRA) 50 MG tablet, Take 2 tablets (100 mg total) by mouth daily., Disp: 60 tablet, Rfl: 3 .  gabapentin (NEURONTIN) 800 MG tablet, Take 1 tablet (800 mg total) by mouth 3 (three) times daily., Disp: 90 tablet, Rfl: 1 .  metolazone (ZAROXOLYN) 2.5 MG tablet, Take 2.5 mg by mouth once a week. Fridays, Disp: , Rfl:  .  metoprolol succinate (TOPROL XL) 25 MG 24 hr  tablet, Take 1 tablet (25 mg total) by mouth 2 (two) times daily., Disp: 180 tablet, Rfl: 3 .  ondansetron (ZOFRAN ODT) 4 MG disintegrating tablet, Take 1 tablet (4 mg total) by mouth every 8 (eight) hours as needed for nausea or vomiting., Disp: 20 tablet, Rfl: 0 .  pantoprazole (PROTONIX) 40 MG tablet, Take 40 mg by mouth 2 (two) times daily., Disp: , Rfl:  .  polyethylene glycol powder (GLYCOLAX/MIRALAX) 17 GM/SCOOP powder,  Take 17 g by mouth daily as needed for mild constipation., Disp: , Rfl:  .  potassium chloride SA (KLOR-CON) 20 MEQ tablet, Take 2 tablets (40 mEq total) by mouth 2 (two) times daily., Disp: 120 tablet, Rfl: 30 .  pravastatin (PRAVACHOL) 20 MG tablet, Take 1 tablet (20 mg total) by mouth daily., Disp: 30 tablet, Rfl: 1 .  senna (SENOKOT) 8.6 MG TABS tablet, Take 2 tablets (17.2 mg total) by mouth daily., Disp: 120 tablet, Rfl: 0 .  sertraline (ZOLOFT) 25 MG tablet, Take 1 tablet (25 mg total) by mouth daily. (Patient taking differently: Take 12.5 mg by mouth daily.), Disp: 30 tablet, Rfl: 0 .  sodium chloride (OCEAN) 0.65 % SOLN nasal spray, Place 1 spray into both nostrils as needed for congestion., Disp: , Rfl:  .  torsemide (DEMADEX) 20 MG tablet, Take 4 tablets (80 mg total) by mouth every morning AND 4 tablets (80 mg total) every evening., Disp: 210 tablet, Rfl: 11 .  traMADol (ULTRAM) 50 MG tablet, Take 1 tablet (50 mg total) by mouth every 6 (six) hours as needed for moderate pain., Disp: 12 tablet, Rfl: 0 .  VENTOLIN HFA 108 (90 Base) MCG/ACT inhaler, Inhale 1 puff into the lungs every 4 (four) hours as needed for shortness of breath., Disp: 6.7 g, Rfl: 1 .  zolpidem (AMBIEN) 10 MG tablet, Take 10 mg by mouth at bedtime., Disp: , Rfl:  Allergies  Allergen Reactions  . Penicillins     Broke out in convulsions as a child  Did it involve swelling of the face/tongue/throat, SOB, or low BP? N/A Did it involve sudden or severe rash/hives, skin peeling, or any reaction on the inside of your mouth or nose? N/A Did you need to seek medical attention at a hospital or doctor's office?N/A When did it last happen?N/A If all above answers are "NO", may proceed with cephalosporin use.     Social History   Socioeconomic History  . Marital status: Single    Spouse name: Not on file  . Number of children: Not on file  . Years of education: Not on file  . Highest education level: Not on file   Occupational History  . Not on file  Tobacco Use  . Smoking status: Never Smoker  . Smokeless tobacco: Never Used  Vaping Use  . Vaping Use: Never used  Substance and Sexual Activity  . Alcohol use: Not Currently  . Drug use: Not Currently    Types: Marijuana    Comment: Smoked for 30 years  . Sexual activity: Not on file  Other Topics Concern  . Not on file  Social History Narrative  . Not on file   Social Determinants of Health   Financial Resource Strain: Medium Risk  . Difficulty of Paying Living Expenses: Somewhat hard  Food Insecurity: No Food Insecurity  . Worried About Charity fundraiser in the Last Year: Never true  . Ran Out of Food in the Last Year: Never true  Transportation Needs: Unmet Transportation  Needs  . Lack of Transportation (Medical): Yes  . Lack of Transportation (Non-Medical): No  Physical Activity: Not on file  Stress: Not on file  Social Connections: Not on file  Intimate Partner Violence: Not on file    Physical Exam Vitals reviewed.  Constitutional:      Appearance: Normal appearance. He is normal weight.  HENT:     Head: Normocephalic.     Nose: Nose normal.     Mouth/Throat:     Mouth: Mucous membranes are moist.     Pharynx: Oropharynx is clear.  Eyes:     Conjunctiva/sclera: Conjunctivae normal.     Pupils: Pupils are equal, round, and reactive to light.  Cardiovascular:     Rate and Rhythm: Normal rate and regular rhythm.     Pulses: Normal pulses.     Heart sounds: Normal heart sounds.  Pulmonary:     Effort: Pulmonary effort is normal.     Breath sounds: Normal breath sounds.  Abdominal:     General: Abdomen is flat.     Palpations: Abdomen is soft.  Musculoskeletal:        General: Swelling present. Normal range of motion.     Cervical back: Normal range of motion.     Right lower leg: Edema present.     Left lower leg: Edema present.  Skin:    General: Skin is warm and dry.     Capillary Refill: Capillary refill  takes less than 2 seconds.  Neurological:     General: No focal deficit present.     Mental Status: He is alert. Mental status is at baseline.  Psychiatric:        Mood and Affect: Mood normal.     Arrived for home visit for Charles Gray who was alert and oriented seated in his recliner in his living room. Charles Gray reports feeling poorly this morning stating he feels "sick". When asked to elaborate he just says, "I feel sick to my stomach and my feet are hurting". This complaint is normal for him. Legs and feet are red and swollen but look better than previous exams. Charles Gray's vitals were obtained and assessment as noted. Charles Gray's weight was up this week however he reports that he has been eating more over the last several days. Lung sounds clear, no JVD. Charles Gray reports he is peeing a lot and having issues with urinary incontinence stating he cannot hold his urine to make it to the bathroom. I advised him to call his PCP to schedule an appointment with him to follow up on this matter. I educated him that his Torsemide will make him urinate more often as well as the Metolazone. He was aware of same. I reviewed medications and filled pillbox accordingly. Charles Gray wore the Zio patch for one week. I removed same and will ship the box off to [facility] today. Charles Gray was emotionally sad today, we sat and talked for a while and he said he felt better after our conversation. He refused resources for therapy or guided counseling.    I will see Charles Gray in one week. Home visit complete.    Future Appointments  Date Time Provider [place]  10/01/2020  2:00 PM Michel Bickers, MD [facility]  10/08/2020  9:00 AM [facility] PA/NP [facility] None  10/09/2020  1:15 PM Valinda Party, DO [facility]     ACTION: Home visit completed Next visit planned for one week

## 2020-09-18 ENCOUNTER — Encounter (HOSPITAL_COMMUNITY): Payer: Self-pay

## 2020-09-19 ENCOUNTER — Encounter (HOSPITAL_COMMUNITY): Payer: Self-pay | Admitting: *Deleted

## 2020-09-19 ENCOUNTER — Encounter (HOSPITAL_COMMUNITY): Payer: Self-pay

## 2020-09-19 NOTE — Telephone Encounter (Signed)
Spoke with pt on the phone he is aware to keep appointment 6/28. Per Dr.McLean take 2.63m of metolazone today and tomorrow. Pt agreeable. Pt advised to call 911 if chest pain becomes severe. Pt thanked me for my call.

## 2020-09-20 ENCOUNTER — Inpatient Hospital Stay (HOSPITAL_COMMUNITY)
Admission: EM | Admit: 2020-09-20 | Discharge: 2020-09-26 | DRG: 291 | Disposition: A | Discharge status: Home Health | Payer: Medicare HMO | Attending: Family Medicine | Admitting: Family Medicine

## 2020-09-20 ENCOUNTER — Encounter (HOSPITAL_COMMUNITY): Payer: Self-pay

## 2020-09-20 ENCOUNTER — Encounter (HOSPITAL_COMMUNITY): Payer: Self-pay | Admitting: Emergency Medicine

## 2020-09-20 ENCOUNTER — Other Ambulatory Visit: Payer: Self-pay

## 2020-09-20 ENCOUNTER — Emergency Department (HOSPITAL_COMMUNITY): Payer: Medicare HMO

## 2020-09-20 DIAGNOSIS — Z880 Allergy status to penicillin: Secondary | ICD-10-CM

## 2020-09-20 DIAGNOSIS — Z8249 Family history of ischemic heart disease and other diseases of the circulatory system: Secondary | ICD-10-CM

## 2020-09-20 DIAGNOSIS — E782 Mixed hyperlipidemia: Secondary | ICD-10-CM | POA: Diagnosis present

## 2020-09-20 DIAGNOSIS — Z20822 Contact with and (suspected) exposure to covid-19: Secondary | ICD-10-CM | POA: Diagnosis present

## 2020-09-20 DIAGNOSIS — I872 Venous insufficiency (chronic) (peripheral): Secondary | ICD-10-CM | POA: Diagnosis present

## 2020-09-20 DIAGNOSIS — R0602 Shortness of breath: Secondary | ICD-10-CM | POA: Diagnosis not present

## 2020-09-20 DIAGNOSIS — Z7901 Long term (current) use of anticoagulants: Secondary | ICD-10-CM

## 2020-09-20 DIAGNOSIS — J449 Chronic obstructive pulmonary disease, unspecified: Secondary | ICD-10-CM | POA: Diagnosis present

## 2020-09-20 DIAGNOSIS — E662 Morbid (severe) obesity with alveolar hypoventilation: Secondary | ICD-10-CM | POA: Diagnosis present

## 2020-09-20 DIAGNOSIS — K7581 Nonalcoholic steatohepatitis (NASH): Secondary | ICD-10-CM | POA: Diagnosis present

## 2020-09-20 DIAGNOSIS — K3189 Other diseases of stomach and duodenum: Secondary | ICD-10-CM | POA: Diagnosis present

## 2020-09-20 DIAGNOSIS — Z79891 Long term (current) use of opiate analgesic: Secondary | ICD-10-CM

## 2020-09-20 DIAGNOSIS — I50813 Acute on chronic right heart failure: Secondary | ICD-10-CM | POA: Diagnosis not present

## 2020-09-20 DIAGNOSIS — Z8719 Personal history of other diseases of the digestive system: Secondary | ICD-10-CM

## 2020-09-20 DIAGNOSIS — J9601 Acute respiratory failure with hypoxia: Secondary | ICD-10-CM | POA: Diagnosis not present

## 2020-09-20 DIAGNOSIS — K766 Portal hypertension: Secondary | ICD-10-CM | POA: Diagnosis present

## 2020-09-20 DIAGNOSIS — N401 Enlarged prostate with lower urinary tract symptoms: Secondary | ICD-10-CM | POA: Diagnosis present

## 2020-09-20 DIAGNOSIS — E119 Type 2 diabetes mellitus without complications: Secondary | ICD-10-CM | POA: Diagnosis present

## 2020-09-20 DIAGNOSIS — I110 Hypertensive heart disease with heart failure: Secondary | ICD-10-CM | POA: Diagnosis not present

## 2020-09-20 DIAGNOSIS — I87009 Postthrombotic syndrome without complications of unspecified extremity: Secondary | ICD-10-CM | POA: Diagnosis present

## 2020-09-20 DIAGNOSIS — E876 Hypokalemia: Secondary | ICD-10-CM | POA: Diagnosis present

## 2020-09-20 DIAGNOSIS — I480 Paroxysmal atrial fibrillation: Secondary | ICD-10-CM | POA: Diagnosis present

## 2020-09-20 DIAGNOSIS — N3941 Urge incontinence: Secondary | ICD-10-CM | POA: Diagnosis present

## 2020-09-20 DIAGNOSIS — Z79899 Other long term (current) drug therapy: Secondary | ICD-10-CM

## 2020-09-20 DIAGNOSIS — G894 Chronic pain syndrome: Secondary | ICD-10-CM | POA: Diagnosis present

## 2020-09-20 DIAGNOSIS — E871 Hypo-osmolality and hyponatremia: Secondary | ICD-10-CM | POA: Diagnosis present

## 2020-09-20 DIAGNOSIS — R42 Dizziness and giddiness: Secondary | ICD-10-CM | POA: Diagnosis present

## 2020-09-20 DIAGNOSIS — D6859 Other primary thrombophilia: Secondary | ICD-10-CM | POA: Diagnosis present

## 2020-09-20 DIAGNOSIS — F419 Anxiety disorder, unspecified: Secondary | ICD-10-CM | POA: Diagnosis present

## 2020-09-20 DIAGNOSIS — I5043 Acute on chronic combined systolic (congestive) and diastolic (congestive) heart failure: Secondary | ICD-10-CM | POA: Diagnosis present

## 2020-09-20 DIAGNOSIS — Z6841 Body Mass Index (BMI) 40.0 and over, adult: Secondary | ICD-10-CM

## 2020-09-20 DIAGNOSIS — Z83438 Family history of other disorder of lipoprotein metabolism and other lipidemia: Secondary | ICD-10-CM

## 2020-09-20 DIAGNOSIS — Z86711 Personal history of pulmonary embolism: Secondary | ICD-10-CM

## 2020-09-20 DIAGNOSIS — I5082 Biventricular heart failure: Secondary | ICD-10-CM | POA: Diagnosis present

## 2020-09-20 DIAGNOSIS — I2510 Atherosclerotic heart disease of native coronary artery without angina pectoris: Secondary | ICD-10-CM | POA: Diagnosis present

## 2020-09-20 DIAGNOSIS — I471 Supraventricular tachycardia: Secondary | ICD-10-CM | POA: Diagnosis present

## 2020-09-20 DIAGNOSIS — Z9119 Patient's noncompliance with other medical treatment and regimen: Secondary | ICD-10-CM

## 2020-09-20 DIAGNOSIS — K7460 Unspecified cirrhosis of liver: Secondary | ICD-10-CM | POA: Diagnosis present

## 2020-09-20 DIAGNOSIS — N3281 Overactive bladder: Secondary | ICD-10-CM | POA: Diagnosis present

## 2020-09-20 DIAGNOSIS — R008 Other abnormalities of heart beat: Secondary | ICD-10-CM | POA: Diagnosis present

## 2020-09-20 DIAGNOSIS — D638 Anemia in other chronic diseases classified elsewhere: Secondary | ICD-10-CM | POA: Diagnosis present

## 2020-09-20 DIAGNOSIS — Z86718 Personal history of other venous thrombosis and embolism: Secondary | ICD-10-CM

## 2020-09-20 DIAGNOSIS — I444 Left anterior fascicular block: Secondary | ICD-10-CM | POA: Diagnosis present

## 2020-09-20 LAB — PROTIME-INR
INR: 1.3 — ABNORMAL HIGH (ref 0.8–1.2)
Prothrombin Time: 16.2 seconds — ABNORMAL HIGH (ref 11.4–15.2)

## 2020-09-20 LAB — COMPREHENSIVE METABOLIC PANEL
ALT: 22 U/L (ref 0–44)
AST: 55 U/L — ABNORMAL HIGH (ref 15–41)
Albumin: 3.4 g/dL — ABNORMAL LOW (ref 3.5–5.0)
Alkaline Phosphatase: 74 U/L (ref 38–126)
Anion gap: 11 (ref 5–15)
BUN: 14 mg/dL (ref 8–23)
CO2: 31 mmol/L (ref 22–32)
Calcium: 8.8 mg/dL — ABNORMAL LOW (ref 8.9–10.3)
Chloride: 92 mmol/L — ABNORMAL LOW (ref 98–111)
Creatinine, Ser: 1.01 mg/dL (ref 0.61–1.24)
GFR, Estimated: >60 mL/min (ref >60)
Glucose, Bld: 116 mg/dL — ABNORMAL HIGH (ref 70–99)
Potassium: 3.6 mmol/L (ref 3.5–5.1)
Sodium: 134 mmol/L — ABNORMAL LOW (ref 135–145)
Total Bilirubin: 1.4 mg/dL — ABNORMAL HIGH (ref 0.3–1.2)
Total Protein: 6.9 g/dL (ref 6.5–8.1)

## 2020-09-20 LAB — RESP PANEL BY RT-PCR (FLU A&B, COVID) ARPGX2
Influenza A by PCR: NEGATIVE
Influenza B by PCR: NEGATIVE
SARS Coronavirus 2 by RT PCR: NEGATIVE

## 2020-09-20 LAB — BRAIN NATRIURETIC PEPTIDE: B Natriuretic Peptide: 21.5 pg/mL (ref 0.0–100.0)

## 2020-09-20 LAB — CBC
HCT: 37.5 % — ABNORMAL LOW (ref 39.0–52.0)
Hemoglobin: 11.3 g/dL — ABNORMAL LOW (ref 13.0–17.0)
MCH: 23.9 pg — ABNORMAL LOW (ref 26.0–34.0)
MCHC: 30.1 g/dL (ref 30.0–36.0)
MCV: 79.4 fL — ABNORMAL LOW (ref 80.0–100.0)
Platelets: 160 10*3/uL (ref 150–400)
RBC: 4.72 MIL/uL (ref 4.22–5.81)
RDW: 16.2 % — ABNORMAL HIGH (ref 11.5–15.5)
WBC: 7.6 10*3/uL (ref 4.0–10.5)
nRBC: 0 % (ref 0.0–0.2)

## 2020-09-20 LAB — TROPONIN I (HIGH SENSITIVITY): Troponin I (High Sensitivity): 6 ng/L (ref <18)

## 2020-09-20 LAB — LACTIC ACID, PLASMA: Lactic Acid, Venous: 1.9 mmol/L (ref 0.5–1.9)

## 2020-09-20 MED ORDER — APIXABAN 5 MG PO TABS
5.0000 mg | ORAL_TABLET | Freq: Two times a day (BID) | ORAL | Status: DC
  Administered 2020-09-21 – 2020-09-26 (×12): 5 mg via ORAL
  Filled 2020-09-20 (×12): qty 1

## 2020-09-20 MED ORDER — ZOLPIDEM TARTRATE 5 MG PO TABS
10.0000 mg | ORAL_TABLET | Freq: Every day | ORAL | Status: DC
  Administered 2020-09-21 – 2020-09-25 (×6): 10 mg via ORAL
  Filled 2020-09-20 (×6): qty 2

## 2020-09-20 MED ORDER — SERTRALINE HCL 25 MG PO TABS
12.5000 mg | ORAL_TABLET | Freq: Every day | ORAL | Status: DC
  Administered 2020-09-21 – 2020-09-26 (×6): 12.5 mg via ORAL
  Filled 2020-09-20 (×6): qty 0.5

## 2020-09-20 MED ORDER — SODIUM CHLORIDE 0.9% FLUSH: 3.0000 mL | INTRAVENOUS | Status: DC | PRN

## 2020-09-20 MED ORDER — BUPRENORPHINE HCL-NALOXONE HCL 8-2 MG SL FILM: 1.0000 | ORAL_FILM | Freq: Two times a day (BID) | SUBLINGUAL | Status: DC

## 2020-09-20 MED ORDER — POLYETHYLENE GLYCOL 3350 17 G PO PACK: 17.0000 g | PACK | Freq: Every day | ORAL | Status: DC | PRN

## 2020-09-20 MED ORDER — SODIUM CHLORIDE 0.9% FLUSH
3.0000 mL | Freq: Two times a day (BID) | INTRAVENOUS | Status: DC
  Administered 2020-09-21 – 2020-09-26 (×11): 3 mL via INTRAVENOUS

## 2020-09-20 MED ORDER — PRAVASTATIN SODIUM 10 MG PO TABS
20.0000 mg | ORAL_TABLET | Freq: Every day | ORAL | Status: DC
  Administered 2020-09-21 – 2020-09-25 (×6): 20 mg via ORAL
  Filled 2020-09-20 (×6): qty 2

## 2020-09-20 MED ORDER — POLYETHYLENE GLYCOL 3350 17 GM/SCOOP PO POWD
17.0000 g | Freq: Every day | ORAL | Status: DC | PRN
  Filled 2020-09-20: qty 255

## 2020-09-20 MED ORDER — SPIRONOLACTONE 25 MG PO TABS
50.0000 mg | ORAL_TABLET | Freq: Every day | ORAL | Status: DC
  Administered 2020-09-21 – 2020-09-26 (×6): 50 mg via ORAL
  Filled 2020-09-20 (×6): qty 2

## 2020-09-20 MED ORDER — ALBUTEROL SULFATE (2.5 MG/3ML) 0.083% IN NEBU: 3.0000 mL | INHALATION_SOLUTION | RESPIRATORY_TRACT | Status: DC | PRN

## 2020-09-20 MED ORDER — ONDANSETRON HCL 4 MG/2ML IJ SOLN
4.0000 mg | Freq: Four times a day (QID) | INTRAMUSCULAR | Status: DC | PRN
  Administered 2020-09-25: 4 mg via INTRAVENOUS
  Filled 2020-09-20: qty 2

## 2020-09-20 MED ORDER — METOLAZONE 2.5 MG PO TABS: 2.5000 mg | ORAL_TABLET | ORAL | Status: DC

## 2020-09-20 MED ORDER — ACETAMINOPHEN 325 MG PO TABS
650.0000 mg | ORAL_TABLET | ORAL | Status: DC | PRN
  Administered 2020-09-21 – 2020-09-25 (×4): 650 mg via ORAL
  Filled 2020-09-20 (×4): qty 2

## 2020-09-20 MED ORDER — POTASSIUM CHLORIDE CRYS ER 20 MEQ PO TBCR: 40.0000 meq | EXTENDED_RELEASE_TABLET | Freq: Two times a day (BID) | ORAL | Status: DC

## 2020-09-20 MED ORDER — FUROSEMIDE 10 MG/ML IJ SOLN
80.0000 mg | Freq: Two times a day (BID) | INTRAMUSCULAR | Status: DC
  Administered 2020-09-21 – 2020-09-24 (×7): 80 mg via INTRAVENOUS
  Filled 2020-09-20 (×7): qty 8

## 2020-09-20 MED ORDER — PANTOPRAZOLE SODIUM 40 MG PO TBEC
40.0000 mg | DELAYED_RELEASE_TABLET | Freq: Two times a day (BID) | ORAL | Status: DC
  Administered 2020-09-21 – 2020-09-26 (×12): 40 mg via ORAL
  Filled 2020-09-20 (×13): qty 1

## 2020-09-20 MED ORDER — GABAPENTIN 800 MG PO TABS
800.0000 mg | ORAL_TABLET | Freq: Three times a day (TID) | ORAL | Status: DC
  Filled 2020-09-20: qty 1

## 2020-09-20 MED ORDER — FUROSEMIDE 10 MG/ML IJ SOLN
40.0000 mg | INTRAMUSCULAR | Status: AC
  Administered 2020-09-20: 40 mg via INTRAVENOUS
  Filled 2020-09-20: qty 4

## 2020-09-20 MED ORDER — GABAPENTIN 400 MG PO CAPS
800.0000 mg | ORAL_CAPSULE | Freq: Three times a day (TID) | ORAL | Status: DC
  Administered 2020-09-21 – 2020-09-26 (×17): 800 mg via ORAL
  Filled 2020-09-20 (×17): qty 2

## 2020-09-20 MED ORDER — SODIUM CHLORIDE 0.9 % IV SOLN: 250.0000 mL | INTRAVENOUS | Status: DC | PRN

## 2020-09-20 MED ORDER — BUPRENORPHINE HCL-NALOXONE HCL 8-2 MG SL SUBL
1.0000 | SUBLINGUAL_TABLET | Freq: Two times a day (BID) | SUBLINGUAL | Status: DC
  Administered 2020-09-21 – 2020-09-26 (×12): 1 via SUBLINGUAL
  Filled 2020-09-20 (×12): qty 1

## 2020-09-20 MED ORDER — METOPROLOL SUCCINATE ER 25 MG PO TB24
25.0000 mg | ORAL_TABLET | Freq: Two times a day (BID) | ORAL | Status: DC
  Administered 2020-09-21 (×2): 25 mg via ORAL
  Filled 2020-09-20 (×2): qty 1

## 2020-09-20 MED ORDER — POLYETHYLENE GLYCOL 3350 17 G PO PACK
17.0000 g | PACK | Freq: Every day | ORAL | Status: DC | PRN
  Administered 2020-09-21 – 2020-09-23 (×3): 17 g via ORAL
  Filled 2020-09-20 (×3): qty 1

## 2020-09-20 NOTE — ED Triage Notes (Signed)
Pt returning to ED by [facility] for complain of not improvement on SOB and leg swollen, pt seen for same 5/27 diagnose with cellulitis and send home with PO abx pt states no relieve. BP 130/82, HR80, R 20, SPO2 95% RA, SR on monitor.

## 2020-09-20 NOTE — Telephone Encounter (Signed)
Spoke with patient and he said he feels terrible and is going to to call EMS to take him to the emergency room.

## 2020-09-20 NOTE — H&P (Signed)
History and Physical    Charles Gray DJM:[id number] DOB: Aug 04, 1955 DOA: 09/20/2020  PCP: Enid Skeens., MD  Patient coming from: Home  I have personally briefly reviewed patient's old medical records in [place]  Chief Complaint: SOB, edema  HPI: Charles Gray is a 65 y.o. male with medical history significant of HTN, DVT/PE on eliquis, PAF, R sided CHF.  In January 2022 the patient underwent a left heart catheterization showing elevated right greater than left heart filling pressures suggesting primarily right-sided ventricular failure but no obstructive coronary disease and his bumetanide was increased to 4 mg in the morning and 3 mg in the evening.   The patient was most recently seen in the hospital and admitted to the hospital Aug 13, 2020 because of cellulitis of his leg.  He was then seen for chronic venous stasis on May 27.  Pt presents to ED with c/o several day h/o increasing SOB, BLE edema, chest heaviness.  Symptoms similar to prior CHF exacerbation.  Symptoms persistent, moderate, worsening.  Notes that his wt is up from 360 lbs (after discharge from last CHF admit) to 380 lbs today.  Takes Zaroxolyn once weekly on fridays, takes BID torsemide (per patient).  No fevers, chills.   ED Course: BNP nl though it was with CHF admit in Feb as well.  CXR neg.  Desats to 86% with ambulation.  EDP gave 14m total lasix.   Review of Systems: As per HPI, otherwise all review of systems negative.  Past Medical History:  Diagnosis Date   Acute on chronic congestive heart failure (HCC)    Acute on chronic diastolic CHF (congestive heart failure) ([facility] 08/23/2019   Acute on chronic right-sided heart failure ([place] 08/06/2019   Acute respiratory failure with hypoxia (HCC)    Anemia of chronic disease    Atrial fibrillation ([place] 12/23/2019   Atrial tachycardia ([facility] 08/12/2019   Benign essential HTN    Chest pain 08/23/2019   Chronic anticoagulation 12/26/2019    Chronic heart failure with preserved EF 05/30/2019   Echo 07/2019: EF 55-60, GR 1 DD   Chronic pain syndrome    Cirrhosis of liver without ascites ([facility]    Class 2 severe obesity due to excess calories with serious comorbidity and body mass index (BMI) of 39.0 to 39.9 in adult ([facility] 05/30/2019   Debility 03/13/2019   Drug induced constipation    Dyspnea    Fatty liver disease, nonalcoholic    GIB (gastrointestinal bleeding) 03/04/2019   Heart failure, systolic, acute (HS[place] 52/22/9798  History of pulmonary embolism 08/12/2019   Hyperlipidemia 01/24/2019   Hypertension 01/24/2019   Hypoalbuminemia due to protein-calorie malnutrition (HCC)    Hypokalemia    Hyponatremia    Hypotension due to drugs    Insomnia 01/24/2019   Lactic acidosis 08/23/2019   Lumbar disc herniation 01/24/2019   Mixed hyperlipidemia 01/24/2019   Morbid obesity with BMI of 40.0-44.9, adult ([facility] 05/30/2019   Normocytic anemia 06/08/2019   Occasional tremors 08/12/2019   OSA (obstructive sleep apnea) 08/12/2019   Palpitations 12/26/2019   Peripheral edema    Personal history of DVT (deep vein thrombosis) 12/26/2019   Portal hypertensive gastropathy ([facility] 03/06/2019   Seen on EGD 03/06/2019   Pulmonary emboli ([facility] 06/08/2019   Pulmonary embolism (HK[facility] 06/08/2019   Redness and swelling of lower leg 08/23/2019   Shock ([place]    Thrombocytopenia ([facility]     Past Surgical History:  Procedure Laterality Date   ESOPHAGOGASTRODUODENOSCOPY (EGD)  WITH PROPOFOL N/A 03/05/2019   Procedure: ESOPHAGOGASTRODUODENOSCOPY (EGD) WITH PROPOFOL;  Surgeon: Doran Stabler, MD;  Location: [facility];  Service: Gastroenterology;  Laterality: N/A;  Large Blood Clot Removed   ESOPHAGOGASTRODUODENOSCOPY (EGD) WITH PROPOFOL N/A 03/06/2019   Procedure: ESOPHAGOGASTRODUODENOSCOPY (EGD) WITH PROPOFOL;  Surgeon: Thornton Park, MD;  Location: [place];  Service: Gastroenterology;  Laterality: N/A;   HEMORROIDECTOMY     RIGHT HEART CATH N/A  08/09/2019   Procedure: RIGHT HEART CATH;  Surgeon: Larey Dresser, MD;  Location: [facility];  Service: Cardiovascular;  Laterality: N/A;   RIGHT/LEFT HEART CATH AND CORONARY ANGIOGRAPHY N/A 05/10/2020   Procedure: RIGHT/LEFT HEART CATH AND CORONARY ANGIOGRAPHY;  Surgeon: Larey Dresser, MD;  Location: [facility];  Service: Cardiovascular;  Laterality: N/A;     reports that he has never smoked. He has never used smokeless tobacco. He reports previous alcohol use. He reports previous drug use. Drug: Marijuana.  Allergies  Allergen Reactions   Penicillins     Broke out in convulsions as a child  Did it involve swelling of the face/tongue/throat, SOB, or low BP? N/A Did it involve sudden or severe rash/hives, skin peeling, or any reaction on the inside of your mouth or nose? N/A Did you need to seek medical attention at a hospital or doctor's office?N/A When did it last happen? N/A If all above answers are "NO", may proceed with cephalosporin use.    Family History  Problem Relation Age of Onset   Hyperlipidemia Mother    Hypertension Mother    Stroke Mother    CAD Maternal Grandmother    CAD Maternal Grandfather      Prior to Admission medications   Medication Sig Start Date End Date Taking? Authorizing Provider  acetaminophen (TYLENOL) 500 MG tablet Take 1,000 mg by mouth every 6 (six) hours as needed for moderate pain or headache.    [provider]  apixaban (ELIQUIS) 5 MG TABS tablet Take 1 tablet (5 mg total) by mouth 2 (two) times daily. 08/12/19   Strader, Fransisco Hertz, PA-C  Buprenorphine HCl-Naloxone HCl 8-2 MG FILM Place 1 tablet under the tongue in the morning and at bedtime.     [provider]  docusate sodium (COLACE) 100 MG capsule Take 1 capsule (100 mg total) by mouth daily as needed for mild constipation or moderate constipation. Patient not taking: Reported on 09/17/2020 07/02/20   [name] D, NP  eplerenone (INSPRA) 50 MG tablet  Take 2 tablets (100 mg total) by mouth daily. 06/25/20   Clegg, [name] D, NP  gabapentin (NEURONTIN) 800 MG tablet Take 1 tablet (800 mg total) by mouth 3 (three) times daily. 03/23/19   Angiulli, Lavon Paganini, PA-C  metolazone (ZAROXOLYN) 2.5 MG tablet Take 2.5 mg by mouth once a week. Fridays    [provider]  metoprolol succinate (TOPROL XL) 25 MG 24 hr tablet Take 1 tablet (25 mg total) by mouth 2 (two) times daily. 06/25/20 09/23/20  Larey Dresser, MD  ondansetron (ZOFRAN ODT) 4 MG disintegrating tablet Take 1 tablet (4 mg total) by mouth every 8 (eight) hours as needed for nausea or vomiting. 04/24/20   Larey Dresser, MD  pantoprazole (PROTONIX) 40 MG tablet Take 40 mg by mouth 2 (two) times daily.    [provider]  polyethylene glycol powder (GLYCOLAX/MIRALAX) 17 GM/SCOOP powder Take 17 g by mouth daily as needed for mild constipation.    [provider]  potassium chloride SA (KLOR-CON) 20 MEQ tablet Take 2 tablets (40 mEq total) by mouth 2 (two) times daily. 09/10/20   Clegg, [name] D, NP  pravastatin (PRAVACHOL) 20 MG tablet Take 1 tablet (20 mg total) by mouth daily. 03/23/19   Angiulli, Lavon Paganini, PA-C  senna (SENOKOT) 8.6 MG TABS tablet Take 2 tablets (17.2 mg total) by mouth daily. 06/13/20   [place] Donnamarie Poag, DO  sertraline (ZOLOFT) 25 MG tablet Take 1 tablet (25 mg total) by mouth daily. Patient taking differently: Take 12.5 mg by mouth daily. 06/25/20   Larey Dresser, MD  sodium chloride (OCEAN) 0.65 % SOLN nasal spray Place 1 spray into both nostrils as needed for congestion.    [provider]  torsemide (DEMADEX) 20 MG tablet Take 4 tablets (80 mg total) by mouth every morning AND 4 tablets (80 mg total) every evening. 08/30/20   Larey Dresser, MD  traMADol (ULTRAM) 50 MG tablet Take 1 tablet (50 mg total) by mouth every 6 (six) hours as needed for moderate pain. Patient not taking: Reported on 09/17/2020 08/20/20   Hosie Poisson, MD  VENTOLIN HFA 108  (90 Base) MCG/ACT inhaler Inhale 1 puff into the lungs every 4 (four) hours as needed for shortness of breath. 03/23/19   Angiulli, Lavon Paganini, PA-C  zolpidem (AMBIEN) 10 MG tablet Take 10 mg by mouth at bedtime. 05/19/19   [provider]  bumetanide (BUMEX) 1 MG tablet Take 4 tablets (4 mg total) by mouth daily with breakfast AND 3 tablets (3 mg total) every evening. 05/10/20 06/13/20  Larey Dresser, MD  metoprolol tartrate (LOPRESSOR) 50 MG tablet Take 1 tablet (50 mg total) by mouth 2 (two) times daily. 02/09/20 04/11/20  Mercy Riding, MD  spironolactone (ALDACTONE) 100 MG tablet Take 1 tablet (100 mg total) by mouth daily. 02/10/20 02/27/20  Mercy Riding, MD    Physical Exam: Vitals:   09/20/20 1659 09/20/20 1715 09/20/20 1830 09/20/20 2015  BP:  107/62 (!) 104/57 102/62  Pulse:  74 72 71  Resp:  11 17 13   Temp:      TempSrc:      SpO2:  95% 93% 91%  Weight: (!) 175.6 kg     Height:        Constitutional: NAD, calm, comfortable Eyes: PERRL, lids and conjunctivae normal ENMT: Mucous membranes are moist. Posterior pharynx clear of any exudate or lesions.Normal dentition.  Neck: normal, supple, no masses, no thyromegaly Respiratory: clear to auscultation bilaterally, no wheezing, no crackles. Normal respiratory effort. No accessory muscle use.  Cardiovascular: Regular rate and rhythm, no murmurs / rubs / gallops. 3+ BLE edema 2+ pedal pulses. No carotid bruits.  Abdomen: no tenderness, no masses palpated. No hepatosplenomegaly. Bowel sounds positive.  Musculoskeletal: no clubbing / cyanosis. No joint deformity upper and lower extremities. Good ROM, no contractures. Normal muscle tone.  Skin: Venous stasis dermatitis of BLE. Neurologic: CN 2-12 grossly intact. Sensation intact, DTR normal. Strength 5/5 in all 4.  Psychiatric: Normal judgment and insight. Alert and oriented x 3. Normal mood.    Labs on Admission: I have personally reviewed following labs and imaging  studies  CBC: Recent Labs  Lab 09/20/20 1604  WBC 7.6  HGB 11.3*  HCT 37.5*  MCV 79.4*  PLT 280   Basic Metabolic Panel: Recent Labs  Lab 09/20/20 1604  NA 134*  K 3.6  CL 92*  CO2 31  GLUCOSE 116*  BUN 14  CREATININE 1.01  CALCIUM 8.8*   GFR: Estimated Creatinine Clearance: 136.5 mL/min (by C-G formula based on SCr of 1.01 mg/dL). Liver Function Tests: Recent Labs  Lab 09/20/20 1604  AST 55*  ALT 22  ALKPHOS 74  BILITOT 1.4*  PROT 6.9  ALBUMIN 3.4*   No results for input(s): LIPASE, AMYLASE in the last 168 hours. No results for input(s): AMMONIA in the last 168 hours. Coagulation Profile: Recent Labs  Lab 09/20/20 1604  INR 1.3*   Cardiac Enzymes: No results for input(s): CKTOTAL, CKMB, CKMBINDEX, TROPONINI in the last 168 hours. BNP (last 3 results) No results for input(s): PROBNP in the last 8760 hours. HbA1C: No results for input(s): HGBA1C in the last 72 hours. CBG: No results for input(s): GLUCAP in the last 168 hours. Lipid Profile: No results for input(s): CHOL, HDL, LDLCALC, TRIG, CHOLHDL, LDLDIRECT in the last 72 hours. Thyroid Function Tests: No results for input(s): TSH, T4TOTAL, FREET4, T3FREE, THYROIDAB in the last 72 hours. Anemia Panel: No results for input(s): VITAMINB12, FOLATE, FERRITIN, TIBC, IRON, RETICCTPCT in the last 72 hours. Urine analysis:    Component Value Date/Time   COLORURINE YELLOW 08/13/2020 1953   APPEARANCEUR CLEAR 08/13/2020 1953   LABSPEC 1.011 08/13/2020 1953   PHURINE 6.0 08/13/2020 1953   GLUCOSEU NEGATIVE 08/13/2020 1953   HGBUR NEGATIVE 08/13/2020 1953   BILIRUBINUR NEGATIVE 08/13/2020 1953   KETONESUR NEGATIVE 08/13/2020 1953   PROTEINUR NEGATIVE 08/13/2020 1953   NITRITE NEGATIVE 08/13/2020 1953   LEUKOCYTESUR NEGATIVE 08/13/2020 1953    Radiological Exams on Admission: DG Chest Port 1 View  Result Date: 09/20/2020 CLINICAL DATA:  Shortness of breath EXAM: PORTABLE CHEST 1 VIEW COMPARISON:   09/06/2020 FINDINGS: Cardiac shadow is within normal limits. Lungs are well aerated bilaterally. No focal infiltrate or effusion is seen. No bony abnormality is noted. IMPRESSION: No active disease. Electronically Signed   By: Inez Catalina M.D.   On: 09/20/2020 18:50    EKG: Ordered and pending  Assessment/Plan Principal Problem:   Acute on chronic right-sided heart failure (HCC) Active Problems:   Chronic pain syndrome   AF (paroxysmal atrial fibrillation) (HCC)   Personal history of DVT (deep vein thrombosis)   Chronic anticoagulation   Acute respiratory failure with hypoxia (HCC)   Chronic venous stasis dermatitis of both lower extremities    Acute on chronic R sided CHF - Pt 20lbs over dry weight CHF pathway Lasix 36m IV BID for the moment Strict intake and output Tele monitor BMP daily Acute resp failure with hypoxia - Desats with ambulation, secondary to #1 above Diuresing as above PAF - Cont eliquis for this and for h/o DVT/PE in past Cont home meds after med-rec completed CPS - Cont home suboxone  DVT prophylaxis: Eliquis Code Status: Full Family Communication: No family in room Disposition Plan: Home after diuresis Consults called: None Admission status: Place in obs    Harnoor Kohles, JAlleghenyvilleHospitalists  How to contact the TParksideAttending or Consulting provider 7A - 7P or covering provider during after hours 7P -7A, for this patient?  Check the care team in [facility] look for a) attending/consulting TRH provider listed and b) the TH[facility]team listed Log into [email or web address]  Amion Physician Scheduling and messaging for groups and whole hospitals  On call and physician scheduling software for group practices, residents, hospitalists and other medical providers for call, clinic, rotation and shift schedules. OnCall [employer] is a hospital-wide system for scheduling doctors and paging doctors on call. EasyPlot is  for scientific plotting and data analysis.   [email or web address]  and use [place]'s universal password to access. If you do not have the password, please contact the hospital operator.  Locate the [facility] provider you are looking for under [facility] and page to a number that you can be directly reached. If you still have difficulty reaching the provider, please page the [facility] (Director on Call) for the Hospitalists listed on amion for assistance.  09/20/2020, 9:20 PM

## 2020-09-20 NOTE — ED Provider Notes (Signed)
[facility] EMERGENCY DEPARTMENT Provider Note   CSN: [id number] Arrival date & time: 09/20/20  1541     History Chief Complaint  Patient presents with   Shortness of Breath    Charles Gray is a 65 y.o. male.   Shortness of Breath  The patient is a 65 year old male known to the hospital for multiple admissions to the hospital for congestive heart failure.  The patient has a known history of atrial fibrillation, he has had pulmonary embolism and takes Eliquis twice a day, he has a history of heart failure but no history of obstructive coronary disease.  He is a diabetic has hypertension and is chronically obese with stasis dermatitis of his legs with intermittent cellulitis.  In January 2022 the patient underwent a left heart catheterization showing elevated right greater than left heart filling pressures suggesting primarily right-sided ventricular failure but no obstructive coronary disease and his bumetanide was increased to 4 mg in the morning and 3 mg in the evening.  The patient was most recently seen in the hospital and admitted to the hospital Aug 13, 2020 because of cellulitis of his leg.  He was then seen for chronic venous stasis on May 27.  He reports that over the last several days he has had increasing shortness of breath, increasing swelling of his legs and starting to have some increasing heavy chest discomfort.  This is similar to what occurs in the past when he has had difficulty with his congestive heart failure with fluid retention.  He denies eating fast food, denies eating salt, states he is compliant with his medications  Review of the medical record shows that the patient takes Zaroxolyn once a week on Fridays, torsemide 80 mg every morning, he no longer takes bumetanide, he no longer takes spironolactone or metoprolol.  Symptoms are persistent, gradually worsening, not associated with fever however he has had some episodes of sweating which is  unusual for him.  He reports that the redness on his legs is getting worse and extending down onto his feet bilaterally  Past Medical History:  Diagnosis Date   Acute on chronic congestive heart failure (HCC)    Acute on chronic diastolic CHF (congestive heart failure) ([place]) 08/23/2019   Acute on chronic right-sided heart failure ([place]) 08/06/2019   Acute respiratory failure with hypoxia (HCC)    Anemia of chronic disease    Atrial fibrillation ([place]) 12/23/2019   Atrial tachycardia ([place]) 08/12/2019   Benign essential HTN    Chest pain 08/23/2019   Chronic anticoagulation 12/26/2019   Chronic heart failure with preserved EF 05/30/2019   Echo 07/2019: EF 55-60, GR 1 DD   Chronic pain syndrome    Cirrhosis of liver without ascites ([place])    Class 2 severe obesity due to excess calories with serious comorbidity and body mass index (BMI) of 39.0 to 39.9 in adult ([place]) 05/30/2019   Debility 03/13/2019   Drug induced constipation    Dyspnea    Fatty liver disease, nonalcoholic    GIB (gastrointestinal bleeding) 03/04/2019   Heart failure, systolic, acute ([place]) 1/47/8295   History of pulmonary embolism 08/12/2019   Hyperlipidemia 01/24/2019   Hypertension 01/24/2019   Hypoalbuminemia due to protein-calorie malnutrition (HCC)    Hypokalemia    Hyponatremia    Hypotension due to drugs    Insomnia 01/24/2019   Lactic acidosis 08/23/2019   Lumbar disc herniation 01/24/2019   Mixed hyperlipidemia 01/24/2019   Morbid obesity with BMI of 40.0-44.9, adult (  [place]) 05/30/2019   Normocytic anemia 06/08/2019   Occasional tremors 08/12/2019   OSA (obstructive sleep apnea) 08/12/2019   Palpitations 12/26/2019   Peripheral edema    Personal history of DVT (deep vein thrombosis) 12/26/2019   Portal hypertensive gastropathy ([place]) 03/06/2019   Seen on EGD 03/06/2019   Pulmonary emboli ([place]) 06/08/2019   Pulmonary embolism ([place]) 06/08/2019   Redness and swelling of lower leg 08/23/2019   Shock ([place])    Thrombocytopenia  ([place])     Patient Active Problem List   Diagnosis Date Noted   Chronic venous stasis dermatitis of both lower extremities 08/27/2020   Cellulitis of both lower extremities 08/14/2020   Microcytic anemia 08/14/2020   GERD without esophagitis 06/10/2020   Atrial fibrillation with rapid ventricular response ([place]) 03/24/2020   Acute on chronic right heart failure ([place]) 01/27/2020   Acute respiratory failure with hypoxia (HCC)    Fatty liver disease, nonalcoholic    Shock ([place])    Personal history of DVT (deep vein thrombosis) 12/26/2019   Chronic anticoagulation 12/26/2019   Palpitations 12/26/2019   AF (paroxysmal atrial fibrillation) ([place]) 12/23/2019   Heart failure, systolic, acute ([place]) 85/05/7739   Acute on chronic congestive heart failure (HCC)    Acute on chronic diastolic CHF (congestive heart failure) ([place]) 08/23/2019   Chest pain 08/23/2019   Lactic acidosis 08/23/2019   History of pulmonary embolism 08/12/2019   Atrial tachycardia ([place]) 08/12/2019   Occasional tremors 08/12/2019   OSA (obstructive sleep apnea) 08/12/2019   Acute on chronic right-sided heart failure ([place]) 08/06/2019   Pulmonary embolism ([place]) 06/08/2019   Normocytic anemia 06/08/2019   Pulmonary emboli ([place]) 06/08/2019   Chronic heart failure with preserved ejection fraction ([place]) 05/30/2019   Class 2 severe obesity due to excess calories with serious comorbidity and body mass index (BMI) of 39.0 to 39.9 in adult ([place]) 05/30/2019   Morbid obesity with BMI of 40.0-44.9, adult ([place]) 05/30/2019   Hyponatremia    Drug induced constipation    Peripheral edema    Hypotension due to drugs    Hypoalbuminemia due to protein-calorie malnutrition (HCC)    Hypokalemia    Thrombocytopenia (HCC)    Anemia of chronic disease    Cirrhosis of liver without ascites ([place])    Chronic pain syndrome    Debility 03/13/2019   Portal hypertensive gastropathy ([place]) 03/06/2019   Dyspnea    GIB (gastrointestinal bleeding)  03/04/2019   Mixed hyperlipidemia 01/24/2019   Insomnia 01/24/2019   Hyperlipidemia 01/24/2019   Essential hypertension 01/24/2019   Lumbar disc herniation 01/24/2019    Past Surgical History:  Procedure Laterality Date   ESOPHAGOGASTRODUODENOSCOPY (EGD) WITH PROPOFOL N/A 03/05/2019   Procedure: ESOPHAGOGASTRODUODENOSCOPY (EGD) WITH PROPOFOL;  Surgeon: Doran Stabler, MD;  Location: [facility];  Service: Gastroenterology;  Laterality: N/A;  Large Blood Clot Removed   ESOPHAGOGASTRODUODENOSCOPY (EGD) WITH PROPOFOL N/A 03/06/2019   Procedure: ESOPHAGOGASTRODUODENOSCOPY (EGD) WITH PROPOFOL;  Surgeon: Thornton Park, MD;  Location: [facility];  Service: Gastroenterology;  Laterality: N/A;   HEMORROIDECTOMY     RIGHT HEART CATH N/A 08/09/2019   Procedure: RIGHT HEART CATH;  Surgeon: Larey Dresser, MD;  Location: [facility];  Service: Cardiovascular;  Laterality: N/A;   RIGHT/LEFT HEART CATH AND CORONARY ANGIOGRAPHY N/A 05/10/2020   Procedure: RIGHT/LEFT HEART CATH AND CORONARY ANGIOGRAPHY;  Surgeon: Larey Dresser, MD;  Location: [facility];  Service: Cardiovascular;  Laterality: N/A;       Family History  Problem Relation Age of Onset   Hyperlipidemia Mother    Hypertension Mother    Stroke Mother    CAD Maternal Grandmother    CAD Maternal Grandfather     Social History   Tobacco Use   Smoking status: Never   Smokeless tobacco: Never  Vaping Use   Vaping Use: Never used  Substance Use Topics   Alcohol use: Not Currently   Drug use: Not Currently    Types: Marijuana    Comment: Smoked for 30 years    Home Medications Prior to Admission medications   Medication Sig Start Date End Date Taking? Authorizing Provider  acetaminophen (TYLENOL) 500 MG tablet Take 1,000 mg by mouth every 6 (six) hours as needed for moderate pain or headache.    [provider]  apixaban (ELIQUIS) 5 MG TABS tablet Take 1 tablet (5 mg total) by mouth 2 (two)  times daily. 08/12/19   Strader, Fransisco Hertz, PA-C  Buprenorphine HCl-Naloxone HCl 8-2 MG FILM Place 1 tablet under the tongue in the morning and at bedtime.     [provider]  docusate sodium (COLACE) 100 MG capsule Take 1 capsule (100 mg total) by mouth daily as needed for mild constipation or moderate constipation. Patient not taking: Reported on 09/17/2020 07/02/20   [name] D, NP  eplerenone (INSPRA) 50 MG tablet Take 2 tablets (100 mg total) by mouth daily. 06/25/20   Clegg, [name] D, NP  gabapentin (NEURONTIN) 800 MG tablet Take 1 tablet (800 mg total) by mouth 3 (three) times daily. 03/23/19   Angiulli, Lavon Paganini, PA-C  metolazone (ZAROXOLYN) 2.5 MG tablet Take 2.5 mg by mouth once a week. Fridays    [provider]  metoprolol succinate (TOPROL XL) 25 MG 24 hr tablet Take 1 tablet (25 mg total) by mouth 2 (two) times daily. 06/25/20 09/23/20  Larey Dresser, MD  ondansetron (ZOFRAN ODT) 4 MG disintegrating tablet Take 1 tablet (4 mg total) by mouth every 8 (eight) hours as needed for nausea or vomiting. 04/24/20   Larey Dresser, MD  pantoprazole (PROTONIX) 40 MG tablet Take 40 mg by mouth 2 (two) times daily.    [provider]  polyethylene glycol powder (GLYCOLAX/MIRALAX) 17 GM/SCOOP powder Take 17 g by mouth daily as needed for mild constipation.    [provider]  potassium chloride SA (KLOR-CON) 20 MEQ tablet Take 2 tablets (40 mEq total) by mouth 2 (two) times daily. 09/10/20   Clegg, [name] D, NP  pravastatin (PRAVACHOL) 20 MG tablet Take 1 tablet (20 mg total) by mouth daily. 03/23/19   Angiulli, Lavon Paganini, PA-C  senna (SENOKOT) 8.6 MG TABS tablet Take 2 tablets (17.2 mg total) by mouth daily. 06/13/20   [place] Donnamarie Poag, DO  sertraline (ZOLOFT) 25 MG tablet Take 1 tablet (25 mg total) by mouth daily. Patient taking differently: Take 12.5 mg by mouth daily. 06/25/20   Larey Dresser, MD  sodium chloride (OCEAN) 0.65 % SOLN nasal spray Place 1 spray into both  nostrils as needed for congestion.    [provider]  torsemide (DEMADEX) 20 MG tablet Take 4 tablets (80 mg total) by mouth every morning AND 4 tablets (80 mg total) every evening. 08/30/20   Larey Dresser, MD  traMADol (ULTRAM) 50 MG tablet Take 1 tablet (50 mg total) by mouth every 6 (six) hours as needed for moderate pain. Patient not taking: Reported on 09/17/2020 08/20/20   Hosie Poisson, MD  [place] Cutter  HFA 108 (90 Base) MCG/ACT inhaler Inhale 1 puff into the lungs every 4 (four) hours as needed for shortness of breath. 03/23/19   Angiulli, Lavon Paganini, PA-C  zolpidem (AMBIEN) 10 MG tablet Take 10 mg by mouth at bedtime. 05/19/19   [provider]  bumetanide (BUMEX) 1 MG tablet Take 4 tablets (4 mg total) by mouth daily with breakfast AND 3 tablets (3 mg total) every evening. 05/10/20 06/13/20  Larey Dresser, MD  metoprolol tartrate (LOPRESSOR) 50 MG tablet Take 1 tablet (50 mg total) by mouth 2 (two) times daily. 02/09/20 04/11/20  Mercy Riding, MD  spironolactone (ALDACTONE) 100 MG tablet Take 1 tablet (100 mg total) by mouth daily. 02/10/20 02/27/20  Mercy Riding, MD    Allergies    Penicillins  Review of Systems   Review of Systems  Respiratory:  Positive for shortness of breath.   All other systems reviewed and are negative.  Physical Exam Updated Vital Signs BP 102/62   Pulse 71   Temp 98.2 F (36.8 C) (Oral)   Resp 13   Ht 2.083 m (6' 10" )   Wt (!) 175.6 kg   SpO2 91%   BMI 40.48 kg/m   Physical Exam Vitals and nursing note reviewed.  Constitutional:      General: He is not in acute distress.    Appearance: He is well-developed.  HENT:     Head: Normocephalic and atraumatic.     Nose: Nose normal.     Mouth/Throat:     Mouth: Mucous membranes are moist.     Pharynx: No oropharyngeal exudate.  Eyes:     General: No scleral icterus.       Right eye: No discharge.        Left eye: No discharge.     Conjunctiva/sclera: Conjunctivae normal.      Pupils: Pupils are equal, round, and reactive to light.  Neck:     Thyroid: No thyromegaly.     Vascular: No JVD.  Cardiovascular:     Rate and Rhythm: Normal rate and regular rhythm.     Heart sounds: Normal heart sounds. No murmur heard.   No friction rub. No gallop.  Pulmonary:     Effort: Pulmonary effort is normal. No respiratory distress.     Breath sounds: Normal breath sounds. No wheezing or rales.  Abdominal:     General: Bowel sounds are normal. There is no distension.     Palpations: Abdomen is soft. There is no mass.     Tenderness: There is no abdominal tenderness.  Musculoskeletal:        General: No tenderness. Normal range of motion.     Cervical back: Normal range of motion and neck supple.     Right lower leg: Edema present.     Left lower leg: Edema present.  Lymphadenopathy:     Cervical: No cervical adenopathy.  Skin:    General: Skin is warm and dry.     Findings: Erythema present. No rash.  Neurological:     Mental Status: He is alert.     Coordination: Coordination normal.  Psychiatric:        Behavior: Behavior normal.    ED Results / Procedures / Treatments   Labs (all labs ordered are listed, but only abnormal results are displayed) Labs Reviewed  CBC - Abnormal; Notable for the following components:      Result Value   Hemoglobin 11.3 (*)    HCT 37.5 (*)  MCV 79.4 (*)    MCH 23.9 (*)    RDW 16.2 (*)    All other components within normal limits  COMPREHENSIVE METABOLIC PANEL - Abnormal; Notable for the following components:   Sodium 134 (*)    Chloride 92 (*)    Glucose, Bld 116 (*)    Calcium 8.8 (*)    Albumin 3.4 (*)    AST 55 (*)    Total Bilirubin 1.4 (*)    All other components within normal limits  PROTIME-INR - Abnormal; Notable for the following components:   Prothrombin Time 16.2 (*)    INR 1.3 (*)    All other components within normal limits  RESP PANEL BY RT-PCR (FLU A&B, COVID) ARPGX2  BRAIN NATRIURETIC PEPTIDE   LACTIC ACID, PLASMA  BASIC METABOLIC PANEL  TROPONIN I (HIGH SENSITIVITY)    EKG EKG Interpretation  Date/Time:  Friday September 20 2020 16:56:57 EDT Ventricular Rate:  81 PR Interval:  174 QRS Duration: 113 QT Interval:  412 QTC Calculation: 479 R Axis:   -58 Text Interpretation: Sinus arrhythmia Left anterior fascicular block Consider anterior infarct since last tracing no significant change Confirmed by Noemi Chapel [id number]) on 09/20/2020 5:00:30 PM  Radiology DG Chest Port 1 View  Result Date: 09/20/2020 CLINICAL DATA:  Shortness of breath EXAM: PORTABLE CHEST 1 VIEW COMPARISON:  09/06/2020 FINDINGS: Cardiac shadow is within normal limits. Lungs are well aerated bilaterally. No focal infiltrate or effusion is seen. No bony abnormality is noted. IMPRESSION: No active disease. Electronically Signed   By: Inez Catalina M.D.   On: 09/20/2020 18:50    Procedures Procedures   Medications Ordered in ED Medications  sodium chloride flush (NS) 0.9 % injection 3 mL (has no administration in time range)  sodium chloride flush (NS) 0.9 % injection 3 mL (has no administration in time range)  0.9 %  sodium chloride infusion (has no administration in time range)  acetaminophen (TYLENOL) tablet 650 mg (has no administration in time range)  ondansetron (ZOFRAN) injection 4 mg (has no administration in time range)  furosemide (LASIX) injection 40 mg (40 mg Intravenous Given 09/20/20 1724)  furosemide (LASIX) injection 40 mg (40 mg Intravenous Given 09/20/20 1941)    ED Course  I have reviewed the triage vital signs and the nursing notes.  Pertinent labs & imaging results that were available during my care of the patient were reviewed by me and considered in my medical decision making (see chart for details).  Clinical Course as of 09/20/20 2100  Fri Sep 20, 2020  1854 Weight is 385 pounds approximately 15 pounds more than his baseline [BM]    Clinical Course User Index [BM] Noemi Chapel,  MD   MDM Rules/Calculators/A&P                          The patient has rather normal vital signs, he will need to have a formal weight, he states that he has gained a significant amount of weight over the last week.  He has a clean coronary cath with regards to obstructive disease as of about 6 months ago, echocardiogram performed December 2021 showed ejection fraction of 60 to 65% with grade 2 diastolic dysfunction.  At this time we will obtain a chest x-ray labs lactic acid and a CBC to make sure there is no significant leukocytosis.  He has erythema of the bilateral ankles and feet which is hot and tender to the touch  which could be consistent with a cellulitis.  That being said with a significant history of right-sided heart failure this may all be related to fluid overload.  He called his cardiologist office, they told him to come to the hospital to be evaluated since they could not work him into the clinic today.  Lasix has been ordered.  Labs are overall reassuring, that being said the patient has been given 2 doses of Lasix at 40 mg each and when he ambulates afterwards he still desaturates to 86% and appears dyspneic.  I have discussed his care with the hospitalist, Dr. Alcario Drought who will come to admit.  Final Clinical Impression(s) / ED Diagnoses Final diagnoses:  Acute on chronic right-sided congestive heart failure [facility])    Rx / DC Orders ED Discharge Orders     None        Noemi Chapel, MD 09/20/20 2100

## 2020-09-20 NOTE — ED Notes (Signed)
Pt ambulated on his room and his SPO2 dropped to 86% on RA, pt placed on 2L [place] and Dr. Sabra Heck notified.

## 2020-09-21 DIAGNOSIS — I50813 Acute on chronic right heart failure: Secondary | ICD-10-CM | POA: Diagnosis not present

## 2020-09-21 LAB — BASIC METABOLIC PANEL
Anion gap: 10 (ref 5–15)
BUN: 13 mg/dL (ref 8–23)
CO2: 36 mmol/L — ABNORMAL HIGH (ref 22–32)
Calcium: 9.3 mg/dL (ref 8.9–10.3)
Chloride: 90 mmol/L — ABNORMAL LOW (ref 98–111)
Creatinine, Ser: 1.02 mg/dL (ref 0.61–1.24)
GFR, Estimated: >60 mL/min (ref >60)
Glucose, Bld: 125 mg/dL — ABNORMAL HIGH (ref 70–99)
Potassium: 3.1 mmol/L — ABNORMAL LOW (ref 3.5–5.1)
Sodium: 136 mmol/L (ref 135–145)

## 2020-09-21 LAB — MAGNESIUM: Magnesium: 2.2 mg/dL (ref 1.7–2.4)

## 2020-09-21 MED ORDER — TAMSULOSIN HCL 0.4 MG PO CAPS
0.4000 mg | ORAL_CAPSULE | Freq: Every day | ORAL | Status: DC
  Administered 2020-09-21 – 2020-09-26 (×6): 0.4 mg via ORAL
  Filled 2020-09-21 (×6): qty 1

## 2020-09-21 MED ORDER — METOLAZONE 2.5 MG PO TABS: 2.5000 mg | ORAL_TABLET | ORAL | Status: DC

## 2020-09-21 MED ORDER — METOLAZONE 2.5 MG PO TABS
2.5000 mg | ORAL_TABLET | Freq: Once | ORAL | Status: AC
  Administered 2020-09-22: 2.5 mg via ORAL
  Filled 2020-09-21: qty 1

## 2020-09-21 MED ORDER — METOPROLOL SUCCINATE ER 25 MG PO TB24: 12.5000 mg | ORAL_TABLET | Freq: Three times a day (TID) | ORAL | Status: DC

## 2020-09-21 MED ORDER — POTASSIUM CHLORIDE CRYS ER 20 MEQ PO TBCR
40.0000 meq | EXTENDED_RELEASE_TABLET | Freq: Three times a day (TID) | ORAL | Status: DC
  Administered 2020-09-21 – 2020-09-24 (×12): 40 meq via ORAL
  Filled 2020-09-21 (×13): qty 2

## 2020-09-21 MED ORDER — METOPROLOL TARTRATE 12.5 MG HALF TABLET
12.5000 mg | ORAL_TABLET | Freq: Three times a day (TID) | ORAL | Status: DC
  Administered 2020-09-21 – 2020-09-22 (×4): 12.5 mg via ORAL
  Filled 2020-09-21 (×4): qty 1

## 2020-09-21 NOTE — Plan of Care (Signed)

## 2020-09-21 NOTE — Progress Notes (Signed)
PROGRESS NOTE   Charles Gray  HLK:[id number] DOB: 1955/09/30 DOA: 09/20/2020 PCP: Enid Skeens., MD  Brief Narrative:   26 white male BMI 41 DVT PE 05/2019 prior Eliquis HFpEF + primary RV dysfunction-multiple admissions 10/2019 01/2020 03/2020 AECHF Supposed to be on BiPAP for OSA-cannot tolerate even nasal pillows Paroxysmal A. fib CHADS2 score >3 GI bleed 01/2019, [name], Chr. LBP   follows with advanced heart failure team-last saw [name] Clegg 09/10/2020  Weight at that visit 169 k--started subsequently torsemide 100 with tapered to 80 twice daily subsequently metolazone was added potassium was added and Zio patch was placed secondary to patient having cold sweats Patient called paramedics and 6/7 feeling "sick"-"stomach paining legs hurting"-he has been having issues with urinary incontinence and what sounds like overactive bladder He was advised to keep his follow-up appointment on 6/9 follow-up telephone call with Dr. Algernon Huxley 6/28 but presented to the emergency room after still feeling terrible on 6/10 follow-up phone call  Was admitted for acute superimposed on chronic CHF with 10 kg weight gain     Hospital-Problem based course  AECHF/large component of right ventricular failure-?  PAH Will consult advanced heart failure team Weights seem inaccurate we will get standing weight Add Zaroxolyn 2.5 in a.m., continue Lasix 80 twice daily, Aldactone 50 [Inspra substitution] Metoprolol changed to 12.5 t 3 times daily-see below Unna boots today Paroxysmal A. Fib CHADS2 score >3 Episodic SVT See above discussion Magnesium today is 2.2 Continue apixaban 5 twice daily-keep on monitors tonight Get a.m. mag as had 1 bout of SVT OSA BiPAP intolerant Will need PSG and further modifications-we will ask pulmonary to schedule a follow-up Probable chronic low back pain Buprenorphine twice daily, gabapentin 800 3 times daily should be de-escalated in outpatient setting by PCP/pain  management Bowel regimen with MiraLAX Prior GI bleed Probable BPH/urge incontinence Start Flomax 0.4 and monitor Hypokalemia Replace potassium 3 times daily 40 mg    DVT prophylaxis: Eliquis Code Status: Full Family Communication: None Disposition:  Status is: Observation  The patient remains OBS appropriate and will d/c before 2 midnights.  Dispo: The patient is from: Home              Anticipated d/c is to: Home              Patient currently is not medically stable to d/c.   Difficult to place patient No   Consultants:  HF team  Procedures:   Antimicrobials:     Subjective: Coherent awake alert feels a little better still somewhat short of breath complains of occasional dizziness We ordered orthostatics which are pending no current chest pain Legs are significantly swollen above baseline - Chills, - diarrhea, - blurred vision, - double vision  Objective: Vitals:   09/21/20 0402 09/21/20 0745 09/21/20 1225 09/21/20 1554  BP: 121/72 (!) 154/72 (!) 146/74 137/79  Pulse: 68 76 73 65  Resp: 16 16 17 18   Temp: 98.4 F (36.9 C) 98.7 F (37.1 C) 98.3 F (36.8 C) 97.7 F (36.5 C)  TempSrc: Oral Oral Oral Oral  SpO2: 99% 97% 99% 100%  Weight: (!) 179.4 kg     Height:        Intake/Output Summary (Last 24 hours) at 09/21/2020 1609 Last data filed at 09/21/2020 1303 Gross per 24 hour  Intake 1320 ml  Output 1850 ml  Net -530 ml   Filed Weights   09/20/20 1659 09/21/20 0201 09/21/20 0402  Weight: (!) 175.6 kg (!) 169.5 kg Marland Kitchen)  179.4 kg    Examination:  Awake coherent thick neck Mallampati 4 cannot appreciate JVD given habitus and thick beard No icterus no pallor Chest clear no rales no rhonchi S1-S2 sinus rhythm on monitors However had about 20-25 beats of SVT  Data Reviewed: personally reviewed   CBC    Component Value Date/Time   WBC 7.6 09/20/2020 1604   RBC 4.72 09/20/2020 1604   HGB 11.3 (L) 09/20/2020 1604   HGB 13.0 03/06/2020 1509   HCT  37.5 (L) 09/20/2020 1604   HCT 40.4 03/06/2020 1509   PLT 160 09/20/2020 1604   PLT 179 03/06/2020 1509   MCV 79.4 (L) 09/20/2020 1604   MCV 79 03/06/2020 1509   MCH 23.9 (L) 09/20/2020 1604   MCHC 30.1 09/20/2020 1604   RDW 16.2 (H) 09/20/2020 1604   RDW 15.7 (H) 03/06/2020 1509   LYMPHSABS 0.8 09/06/2020 2151   MONOABS 0.6 09/06/2020 2151   EOSABS 0.2 09/06/2020 2151   BASOSABS 0.0 09/06/2020 2151   CMP Latest Ref Rng & Units 09/21/2020 09/20/2020 09/10/2020  Glucose 70 - 99 mg/dL 125(H) 116(H) 115(H)  BUN 8 - 23 mg/dL 13 14 15   Creatinine 0.61 - 1.24 mg/dL 1.02 1.01 1.01  Sodium 135 - 145 mmol/L 136 134(L) 133(L)  Potassium 3.5 - 5.1 mmol/L 3.1(L) 3.6 3.5  Chloride 98 - 111 mmol/L 90(L) 92(L) 88(L)  CO2 22 - 32 mmol/L 36(H) 31 34(H)  Calcium 8.9 - 10.3 mg/dL 9.3 8.8(L) 9.1  Total Protein 6.5 - 8.1 g/dL - 6.9 -  Total Bilirubin 0.3 - 1.2 mg/dL - 1.4(H) -  Alkaline Phos 38 - 126 U/L - 74 -  AST 15 - 41 U/L - 55(H) -  ALT 0 - 44 U/L - 22 -     Radiology Studies: DG Chest Port 1 View  Result Date: 09/20/2020 CLINICAL DATA:  Shortness of breath EXAM: PORTABLE CHEST 1 VIEW COMPARISON:  09/06/2020 FINDINGS: Cardiac shadow is within normal limits. Lungs are well aerated bilaterally. No focal infiltrate or effusion is seen. No bony abnormality is noted. IMPRESSION: No active disease. Electronically Signed   By: Inez Catalina M.D.   On: 09/20/2020 18:50     Scheduled Meds:  apixaban  5 mg Oral BID   buprenorphine-naloxone  1 tablet Sublingual BID   furosemide  80 mg Intravenous BID   gabapentin  800 mg Oral TID   [START ON 09/22/2020] metolazone  2.5 mg Oral Weekly   metoprolol succinate  12.5 mg Oral TID   pantoprazole  40 mg Oral BID   potassium chloride SA  40 mEq Oral TID   pravastatin  20 mg Oral QHS   sertraline  12.5 mg Oral Daily   sodium chloride flush  3 mL Intravenous Q12H   spironolactone  50 mg Oral Daily   tamsulosin  0.4 mg Oral Daily   zolpidem  10 mg Oral QHS    Continuous Infusions:  sodium chloride       LOS: 0 days   Time spent: 14  Nita Sells, MD [facility] To contact the attending provider between 7A-7P or the covering provider during after hours 7P-7A, please log into the web site [email or web address] and access using universal [place] password for that web site. If you do not have the password, please call the hospital operator.  09/21/2020, 4:09 PM

## 2020-09-21 NOTE — Plan of Care (Signed)

## 2020-09-21 NOTE — Plan of Care (Signed)
  Problem: Education: Goal: Knowledge of General Education information will improve Description: Including pain rating scale, medication(s)/side effects and non-pharmacologic comfort measures Outcome: Progressing   Problem: Health Behavior/Discharge Planning: Goal: Ability to manage health-related needs will improve Outcome: Progressing   Problem: Clinical Measurements: Goal: Ability to maintain clinical measurements within normal limits will improve Outcome: Progressing   Problem: Clinical Measurements: Goal: Respiratory complications will improve Outcome: Progressing   Problem: Clinical Measurements: Goal: Cardiovascular complication will be avoided Outcome: Progressing   Problem: Activity: Goal: Risk for activity intolerance will decrease Outcome: Progressing

## 2020-09-21 NOTE — Progress Notes (Signed)
Orthopedic Tech Progress Note Patient Details:  Charles Gray [birth year] [id number]  Ortho Devices Type of Ortho Device: Haematologist Ortho Device/Splint Location: Bilateral Ortho Device/Splint Interventions: Application, Ordered   Post Interventions Patient Tolerated: Well  Charles Gray A [name] 09/21/2020, 5:17 PM

## 2020-09-22 DIAGNOSIS — I50813 Acute on chronic right heart failure: Secondary | ICD-10-CM | POA: Diagnosis not present

## 2020-09-22 LAB — BASIC METABOLIC PANEL
Anion gap: 8 (ref 5–15)
BUN: 14 mg/dL (ref 8–23)
CO2: 33 mmol/L — ABNORMAL HIGH (ref 22–32)
Calcium: 9.3 mg/dL (ref 8.9–10.3)
Chloride: 93 mmol/L — ABNORMAL LOW (ref 98–111)
Creatinine, Ser: 0.93 mg/dL (ref 0.61–1.24)
GFR, Estimated: >60 mL/min (ref >60)
Glucose, Bld: 124 mg/dL — ABNORMAL HIGH (ref 70–99)
Potassium: 3.4 mmol/L — ABNORMAL LOW (ref 3.5–5.1)
Sodium: 134 mmol/L — ABNORMAL LOW (ref 135–145)

## 2020-09-22 LAB — MAGNESIUM: Magnesium: 2.3 mg/dL (ref 1.7–2.4)

## 2020-09-22 NOTE — Plan of Care (Signed)

## 2020-09-22 NOTE — Consult Note (Signed)
Cardiology Consultation:   Patient ID: Charles Gray MRN: [id number]; DOB: [birth year]  Admit date: 09/20/2020 Date of Consult: 09/22/2020  PCP:  Enid Skeens., MD   [facility] Providers Cardiologist:  Berniece Salines, DO     Patient Profile:   Charles Gray is a 65 y.o. male with a hx of COPD, cirrhosis, DVT/PE 10/6732, chronic diastolic heart failure with RV dysfunction who is being seen 09/22/2020 for the evaluation of CHF at the request of Dr. Verlon Au.  History of Present Illness:   Charles Gray is a 65 year old male with past medical history noted above.  He has been followed by the advanced heart failure clinic as an outpatient.  He has a history of multiple hospitalizations back in 2021 for CHF exacerbation IV Lasix.  Echocardiogram 07/2019 showed normal LVEF of 55 to 60% with grade 1 diastolic dysfunction.  RV was not well visualized but was felt to have a component of RV failure.  Right heart cath at that time showed normal PA pressure and normal cardiac output.  PFTs were completed with mild obstructive airway disease.  Had a coronary CT 10/2019 which showed no coronary calcium but concern for soft plaque in the distal left circumflex and distal RCA.  He was treated medically.  Was admitted in October and December 2021.  Echocardiogram 04/01/2020 showed EF of 60 to 65% with grade 2 diastolic dysfunction poorly visualized RV.  During this December admission CardioMEMS was discussed with the patient but he was reluctant.  Jardiance was added along with metolazone which was increased to twice a week.  In addition to Toprol XL 25 daily.  Last admitted 05/2020 with acute on chronic diastolic heart failure.  Diuresed with IV Lasix and metolazone.  Transition to torsemide 60 mg twice daily and metolazone twice weekly.  Discharge weight of 364 pounds.  Brief admission 5/28 for cellulitis which was treated with antibiotics.  Seen in the advanced heart failure clinic on 09/10/2020 for follow-up.   Weights reported at home around 373-381 pounds.  Was compliant with home medications.  Instructed to continue torsemide 80 mg twice daily.  Notes indicate he had complained of palpitations and plan was to place a Zio patch for 7 days to assess for arrhythmias.  Presented to the ED on 6/10 with complaints of increasing shortness of breath, lower extremity edema and chest pressure.   In the ED labs showed sodium 136, potassium 3.6, creatinine 1.02, BNP 21, WBC 7.6, hemoglobin 11.3.  Chest x-ray negative.  EKG showed sinus arrhythmia with left anterior fascicular block.  He was noted to desat with O2 readings of 86% with ambulation while in the ED.  He was admitted to internal medicine for further management.  Started on IV Lasix 14m BID for diuresis.    Past Medical History:  Diagnosis Date   Acute on chronic congestive heart failure (HCC)    Acute on chronic diastolic CHF (congestive heart failure) ([facility] 08/23/2019   Acute on chronic right-sided heart failure ([facility] 08/06/2019   Acute respiratory failure with hypoxia (HCC)    Anemia of chronic disease    Atrial fibrillation ([facility] 12/23/2019   Atrial tachycardia ([facility] 08/12/2019   Benign essential HTN    Chest pain 08/23/2019   Chronic anticoagulation 12/26/2019   Chronic heart failure with preserved EF 05/30/2019   Echo 07/2019: EF 55-60, GR 1 DD   Chronic pain syndrome    Cirrhosis of liver without ascites (HCC)    Class 2 severe obesity due to excess  calories with serious comorbidity and body mass index (BMI) of 39.0 to 39.9 in adult [facility]) 05/30/2019   Debility 03/13/2019   Drug induced constipation    Dyspnea    Fatty liver disease, nonalcoholic    GIB (gastrointestinal bleeding) 03/04/2019   Heart failure, systolic, acute ([place]) 12/27/9448   History of pulmonary embolism 08/12/2019   Hyperlipidemia 01/24/2019   Hypertension 01/24/2019   Hypoalbuminemia due to protein-calorie malnutrition (HCC)    Hypokalemia    Hyponatremia    Hypotension  due to drugs    Insomnia 01/24/2019   Lactic acidosis 08/23/2019   Lumbar disc herniation 01/24/2019   Mixed hyperlipidemia 01/24/2019   Morbid obesity with BMI of 40.0-44.9, adult ([place]) 05/30/2019   Normocytic anemia 06/08/2019   Occasional tremors 08/12/2019   OSA (obstructive sleep apnea) 08/12/2019   Palpitations 12/26/2019   Peripheral edema    Personal history of DVT (deep vein thrombosis) 12/26/2019   Portal hypertensive gastropathy ([place]) 03/06/2019   Seen on EGD 03/06/2019   Pulmonary emboli ([place]) 06/08/2019   Pulmonary embolism ([place]) 06/08/2019   Redness and swelling of lower leg 08/23/2019   Shock ([place])    Thrombocytopenia ([place])     Past Surgical History:  Procedure Laterality Date   ESOPHAGOGASTRODUODENOSCOPY (EGD) WITH PROPOFOL N/A 03/05/2019   Procedure: ESOPHAGOGASTRODUODENOSCOPY (EGD) WITH PROPOFOL;  Surgeon: Doran Stabler, MD;  Location: [place];  Service: Gastroenterology;  Laterality: N/A;  Large Blood Clot Removed   ESOPHAGOGASTRODUODENOSCOPY (EGD) WITH PROPOFOL N/A 03/06/2019   Procedure: ESOPHAGOGASTRODUODENOSCOPY (EGD) WITH PROPOFOL;  Surgeon: Thornton Park, MD;  Location: [place];  Service: Gastroenterology;  Laterality: N/A;   HEMORROIDECTOMY     RIGHT HEART CATH N/A 08/09/2019   Procedure: RIGHT HEART CATH;  Surgeon: Larey Dresser, MD;  Location: [place] [facility];  Service: Cardiovascular;  Laterality: N/A;   RIGHT/LEFT HEART CATH AND CORONARY ANGIOGRAPHY N/A 05/10/2020   Procedure: RIGHT/LEFT HEART CATH AND CORONARY ANGIOGRAPHY;  Surgeon: Larey Dresser, MD;  Location: [place] [facility];  Service: Cardiovascular;  Laterality: N/A;     Home Medications:  Prior to Admission medications   Medication Sig Start Date End Date Taking? Authorizing Provider  acetaminophen (TYLENOL) 500 MG tablet Take 1,000 mg by mouth every 6 (six) hours as needed for moderate pain or headache.   Yes [provider]  apixaban (ELIQUIS) 5 MG TABS tablet Take  1 tablet (5 mg total) by mouth 2 (two) times daily. 08/12/19  Yes Strader, [place] M, PA-C  Buprenorphine HCl-Naloxone HCl 8-2 MG FILM Place 1 tablet under the tongue in the morning and at bedtime.    Yes [provider]  eplerenone (INSPRA) 50 MG tablet Take 2 tablets (100 mg total) by mouth daily. 06/25/20  Yes Clegg, [name] D, NP  gabapentin (NEURONTIN) 800 MG tablet Take 1 tablet (800 mg total) by mouth 3 (three) times daily. 03/23/19  Yes Angiulli, Lavon Paganini, PA-C  metolazone (ZAROXOLYN) 2.5 MG tablet Take 2.5 mg by mouth once a week. Fridays   Yes [provider]  metoprolol succinate (TOPROL XL) 25 MG 24 hr tablet Take 1 tablet (25 mg total) by mouth 2 (two) times daily. 06/25/20 09/23/20 Yes Larey Dresser, MD  ondansetron (ZOFRAN ODT) 4 MG disintegrating tablet Take 1 tablet (4 mg total) by mouth every 8 (eight) hours as needed for nausea or vomiting. 04/24/20  Yes Larey Dresser, MD  pantoprazole (PROTONIX) 40 MG tablet Take 40 mg by mouth 2 (two) times  daily.   Yes [provider]  polyethylene glycol powder (GLYCOLAX/MIRALAX) 17 GM/SCOOP powder Take 17 g by mouth daily as needed for mild constipation.   Yes [provider]  potassium chloride SA (KLOR-CON) 20 MEQ tablet Take 2 tablets (40 mEq total) by mouth 2 (two) times daily. 09/10/20  Yes Clegg, [name] D, NP  pravastatin (PRAVACHOL) 20 MG tablet Take 1 tablet (20 mg total) by mouth daily. Patient taking differently: Take 20 mg by mouth at bedtime. 03/23/19  Yes Angiulli, Lavon Paganini, PA-C  sertraline (ZOLOFT) 25 MG tablet Take 1 tablet (25 mg total) by mouth daily. Patient taking differently: Take 12.5 mg by mouth daily. 06/25/20  Yes Larey Dresser, MD  sodium chloride (OCEAN) 0.65 % SOLN nasal spray Place 1 spray into both nostrils as needed for congestion.   Yes [provider]  torsemide (DEMADEX) 20 MG tablet Take 4 tablets (80 mg total) by mouth every morning AND 4 tablets (80 mg total) every  evening. 08/30/20  Yes Larey Dresser, MD  VENTOLIN HFA 108 (90 Base) MCG/ACT inhaler Inhale 1 puff into the lungs every 4 (four) hours as needed for shortness of breath. 03/23/19  Yes Angiulli, Lavon Paganini, PA-C  zolpidem (AMBIEN) 10 MG tablet Take 10 mg by mouth at bedtime. 05/19/19  Yes [provider]  docusate sodium (COLACE) 100 MG capsule Take 1 capsule (100 mg total) by mouth daily as needed for mild constipation or moderate constipation. Patient not taking: No sig reported 07/02/20   [name] D, NP  senna (SENOKOT) 8.6 MG TABS tablet Take 2 tablets (17.2 mg total) by mouth daily. Patient not taking: Reported on 09/20/2020 06/13/20   [place] Donnamarie Poag, DO  traMADol (ULTRAM) 50 MG tablet Take 1 tablet (50 mg total) by mouth every 6 (six) hours as needed for moderate pain. Patient not taking: No sig reported 08/20/20   Hosie Poisson, MD  bumetanide (BUMEX) 1 MG tablet Take 4 tablets (4 mg total) by mouth daily with breakfast AND 3 tablets (3 mg total) every evening. 05/10/20 06/13/20  Larey Dresser, MD  metoprolol tartrate (LOPRESSOR) 50 MG tablet Take 1 tablet (50 mg total) by mouth 2 (two) times daily. 02/09/20 04/11/20  Mercy Riding, MD  spironolactone (ALDACTONE) 100 MG tablet Take 1 tablet (100 mg total) by mouth daily. 02/10/20 02/27/20  Mercy Riding, MD    Inpatient Medications: Scheduled Meds:  apixaban  5 mg Oral BID   buprenorphine-naloxone  1 tablet Sublingual BID   furosemide  80 mg Intravenous BID   gabapentin  800 mg Oral TID   metoprolol tartrate  12.5 mg Oral TID   pantoprazole  40 mg Oral BID   potassium chloride SA  40 mEq Oral TID   pravastatin  20 mg Oral QHS   sertraline  12.5 mg Oral Daily   sodium chloride flush  3 mL Intravenous Q12H   spironolactone  50 mg Oral Daily   tamsulosin  0.4 mg Oral Daily   zolpidem  10 mg Oral QHS   Continuous Infusions:  sodium chloride     PRN Meds: sodium chloride, acetaminophen, albuterol, ondansetron (ZOFRAN) IV,  polyethylene glycol, sodium chloride flush  Allergies:    Allergies  Allergen Reactions   Penicillins     Broke out in convulsions as a child  Did it involve swelling of the face/tongue/throat, SOB, or low BP? N/A Did it involve sudden or severe rash/hives, skin peeling, or any reaction on  the inside of your mouth or nose? N/A Did you need to seek medical attention at a hospital or doctor's office?N/A When did it last happen? N/A If all above answers are "NO", may proceed with cephalosporin use.    Social History:   Social History   Socioeconomic History   Marital status: Single    Spouse name: Not on file   Number of children: Not on file   Years of education: Not on file   Highest education level: Not on file  Occupational History   Not on file  Tobacco Use   Smoking status: Never   Smokeless tobacco: Never  Vaping Use   Vaping Use: Never used  Substance and Sexual Activity   Alcohol use: Not Currently   Drug use: Not Currently    Types: Marijuana    Comment: Smoked for 30 years   Sexual activity: Not on file  Other Topics Concern   Not on file  Social History Narrative   Not on file   Social Determinants of Health   Financial Resource Strain: Medium Risk   Difficulty of Paying Living Expenses: Somewhat hard  Food Insecurity: No Food Insecurity   Worried About Running Out of Food in the Last Year: Never true   Ran Out of Food in the Last Year: Never true  Transportation Needs: Unmet Transportation Needs   Lack of Transportation (Medical): Yes   Lack of Transportation (Non-Medical): No  Physical Activity: Not on file  Stress: Not on file  Social Connections: Not on file  Intimate Partner Violence: Not on file    Family History:   Reviewed Family History  Problem Relation Age of Onset   Hyperlipidemia Mother    Hypertension Mother    Stroke Mother    CAD Maternal Grandmother    CAD Maternal Grandfather      ROS:  Please see the history of present  illness.  Positive constipation and abdominal distension   All other ROS reviewed and negative.     Physical Exam/Data:   Vitals:   09/22/20 0022 09/22/20 0023 09/22/20 0520 09/22/20 0738  BP:  121/83 (!) 96/52 132/77  Pulse:  61 (!) 55 65  Resp:  20 20 18   Temp:  97.7 F (36.5 C) 97.6 F (36.4 C) 97.6 F (36.4 C)  TempSrc:  Oral Oral Oral  SpO2:  100% 97% 100%  Weight: (!) 170.9 kg     Height:        Intake/Output Summary (Last 24 hours) at 09/22/2020 1034 Last data filed at 09/22/2020 0934 Gross per 24 hour  Intake 2638 ml  Output 2876 ml  Net -238 ml   Last 3 Weights 09/22/2020 09/21/2020 09/21/2020  Weight (lbs) 376 lb 12.8 oz 395 lb 8.1 oz 373 lb 10.9 oz  Weight (kg) 170.915 kg 179.4 kg 169.5 kg     Body mass index is 39.4 kg/m.   Obese white male Lungs clear Distant heart sounds Abdomen benign Plus 2 LE edema legs wrapped  EKG:  The EKG was personally reviewed and demonstrates:  SR LAFB poor R wave progression   Telemetry:  Telemetry was personally reviewed and demonstrates:  NSR 09/22/2020   Relevant CV Studies:  Right Heart Cath 04/2020  Right Heart Pressures RHC Procedural Findings: Hemodynamics (mmHg) RA mean 13 RV 33/15 PA 32/20 PCWP mean 15 LV 91/19 AO 95/65  Oxygen saturations: PA 69% AO 98%  Cardiac Output (Fick) 7.81  Cardiac Index (Fick) 2.58 PVR 1.2 WU  Echo:  03/25/20  IMPRESSIONS     1. Left ventricular ejection fraction, by estimation, is 60 to 65%. The  left ventricle has normal function. The left ventricle has no regional  wall motion abnormalities. Left ventricular diastolic parameters are  consistent with Grade II diastolic  dysfunction (pseudonormalization).   2. Right ventricular systolic function was not well visualized. The right  ventricular size is not well visualized.   3. The mitral valve was not well visualized.   4. The aortic valve was not well visualized. Aortic valve regurgitation  is not visualized. No  aortic stenosis is present.   5. Aortic dilatation noted. There is mild dilatation of the aortic root,  measuring 38 mm.   6. Very poor echo images. LV marginally visualized, EF appears normal.   Laboratory Data:  High Sensitivity Troponin:   Recent Labs  Lab 09/20/20 1604  TROPONINIHS 6     Chemistry Recent Labs  Lab 09/20/20 1604 09/21/20 0335 09/22/20 0244  NA 134* 136 134*  K 3.6 3.1* 3.4*  CL 92* 90* 93*  CO2 31 36* 33*  GLUCOSE 116* 125* 124*  BUN 14 13 14   CREATININE 1.01 1.02 0.93  CALCIUM 8.8* 9.3 9.3  GFRNONAA >60 >60 >60  ANIONGAP 11 10 8     Recent Labs  Lab 09/20/20 1604  PROT 6.9  ALBUMIN 3.4*  AST 55*  ALT 22  ALKPHOS 74  BILITOT 1.4*   Hematology Recent Labs  Lab 09/20/20 1604  WBC 7.6  RBC 4.72  HGB 11.3*  HCT 37.5*  MCV 79.4*  MCH 23.9*  MCHC 30.1  RDW 16.2*  PLT 160   BNP Recent Labs  Lab 09/20/20 1604  BNP 21.5    DDimer No results for input(s): DDIMER in the last 168 hours.   Radiology/Studies:  DG Chest Port 1 View  Result Date: 09/20/2020 CLINICAL DATA:  Shortness of breath EXAM: PORTABLE CHEST 1 VIEW COMPARISON:  09/06/2020 FINDINGS: Cardiac shadow is within normal limits. Lungs are well aerated bilaterally. No focal infiltrate or effusion is seen. No bony abnormality is noted. IMPRESSION: No active disease. Electronically Signed   By: Inez Catalina M.D.   On: 09/20/2020 18:50     Assessment and Plan:   Charles Gray is a 65 y.o. male with a hx of COPD, cirrhosis, DVT/PE 0/1601, chronic diastolic heart failure with RV dysfunction who is being seen 09/22/2020 for   Acute/Chronic Right Heart Failure:  From obesity hypoventilation COPD, DVT PE No CAD at cath and normal LVEF.  Right heart pressures on cath January showed normal CO PCWP 15 and no evidence of pulmonary HTN.  Would continue diuresis iv. His BNP is normal which may suggest that a lot of his LE edema is postphlebitic syndrome and related to LE venous disease Dr  [name] and CHF team will follow in am   Jenkins Rouge MD [facility]

## 2020-09-22 NOTE — Progress Notes (Addendum)
PROGRESS NOTE   Charles Gray  [name] DOB: 10-Aug-1955 DOA: 09/20/2020 PCP: Enid Skeens., MD  Brief Narrative:   69 white male BMI 41 DVT PE 05/2019 prior Eliquis HFpEF + primary RV dysfunction-multiple admissions 10/2019 01/2020 03/2020 AECHF Supposed to be on BiPAP for OSA-cannot tolerate even nasal pillows Paroxysmal A. fib CHADS2 score >3 GI bleed 01/2019, [name], Chr. LBP   follows with advanced heart failure team-last saw [name] Clegg 09/10/2020  Weight at that visit 169 k--started subsequently torsemide 100 with tapered to 80 twice daily subsequently metolazone was added potassium was added and Zio patch was placed secondary to patient having cold sweats Patient called paramedics and 6/7 feeling "sick"-"stomach paining legs hurting"-he has been having issues with urinary incontinence and what sounds like overactive bladder He was advised to keep his follow-up appointment on 6/9 follow-up telephone call with Dr. Algernon Huxley 6/28 but presented to the emergency room after still feeling terrible on 6/10 follow-up phone call  Was admitted for acute superimposed on chronic CHF with 10 kg weight gain   Hospital-Problem based course  Acute superimposed on chronic HFpEF Appreciate in advance cardiology input Standing weight currently 170  Continue Zaroxolyn 2.5 in a.m., continue Lasix 80 twice daily, Aldactone 50 [Inspra substitution] Metoprolol changed to 12.5 to 3 times daily-see below Unna boots Paroxysmal A. Fib CHADS2 score >3 Episodic SVT documented on 6/11 on telemetry See above discussion Continue apixaban 5 twice daily-keep on monitors tonight OSA BiPAP intolerant Will need PSG and further modifications-we will ask pulmonary to schedule a follow-up Probable chronic low back pain Buprenorphine twice daily, gabapentin 800 3 times daily should be de-escalated in outpatient setting by PCP/pain management Bowel regimen with MiraLAX Prior GI bleed Probable BPH/urge  incontinence Start Flomax 0.4 and monitor Hypokalemia Replace potassium 3 times daily 40 mg    DVT prophylaxis: Eliquis Code Status: Full Family Communication: None Disposition:  Status is: Observation  The patient remains OBS appropriate and will d/c before 2 midnights.  Dispo: The patient is from: Home              Anticipated d/c is to: Home              Patient currently is not medically stable to d/c.   Difficult to place patient No   Consultants:  HF team  Procedures:   Antimicrobials:     Subjective: Some neck pain, feels a little "dizzy" but thinks this is from the neck pain Asking for MiraLAX-nurses about to give him prune juice No chest pain no fever Ambulated to the restroom   Objective: Vitals:   09/22/20 0022 09/22/20 0023 09/22/20 0520 09/22/20 0738  BP:  121/83 (!) 96/52 132/77  Pulse:  61 (!) 55 65  Resp:  20 20 18   Temp:  97.7 F (36.5 C) 97.6 F (36.4 C) 97.6 F (36.4 C)  TempSrc:  Oral Oral Oral  SpO2:  100% 97% 100%  Weight: (!) 170.9 kg     Height:        Intake/Output Summary (Last 24 hours) at 09/22/2020 0934 Last data filed at 09/22/2020 0854 Gross per 24 hour  Intake 2638 ml  Output 2901 ml  Net -263 ml    Filed Weights   09/21/20 0201 09/21/20 0402 09/22/20 0022  Weight: (!) 169.5 kg (!) 179.4 kg (!) 170.9 kg    Examination:  thick neck Mallampati 4 cannot appreciate JVD given habitus and thick beard No icterus no pallor Chest clear no rales  no rhonchi S1-S2 normal sinus rhythm-no recurrence of SVT on the monitors today Abdomen is soft obese nontender he has some areas of rash that he has been scratching at He now has Unna boots Neurologically intact no focal deficit  Data Reviewed: personally reviewed   CBC    Component Value Date/Time   WBC 7.6 09/20/2020 1604   RBC 4.72 09/20/2020 1604   HGB 11.3 (L) 09/20/2020 1604   HGB 13.0 03/06/2020 1509   HCT 37.5 (L) 09/20/2020 1604   HCT 40.4 03/06/2020 1509   PLT  160 09/20/2020 1604   PLT 179 03/06/2020 1509   MCV 79.4 (L) 09/20/2020 1604   MCV 79 03/06/2020 1509   MCH 23.9 (L) 09/20/2020 1604   MCHC 30.1 09/20/2020 1604   RDW 16.2 (H) 09/20/2020 1604   RDW 15.7 (H) 03/06/2020 1509   LYMPHSABS 0.8 09/06/2020 2151   MONOABS 0.6 09/06/2020 2151   EOSABS 0.2 09/06/2020 2151   BASOSABS 0.0 09/06/2020 2151   CMP Latest Ref Rng & Units 09/22/2020 09/21/2020 09/20/2020  Glucose 70 - 99 mg/dL 124(H) 125(H) 116(H)  BUN 8 - 23 mg/dL 14 13 14   Creatinine 0.61 - 1.24 mg/dL 0.93 1.02 1.01  Sodium 135 - 145 mmol/L 134(L) 136 134(L)  Potassium 3.5 - 5.1 mmol/L 3.4(L) 3.1(L) 3.6  Chloride 98 - 111 mmol/L 93(L) 90(L) 92(L)  CO2 22 - 32 mmol/L 33(H) 36(H) 31  Calcium 8.9 - 10.3 mg/dL 9.3 9.3 8.8(L)  Total Protein 6.5 - 8.1 g/dL - - 6.9  Total Bilirubin 0.3 - 1.2 mg/dL - - 1.4(H)  Alkaline Phos 38 - 126 U/L - - 74  AST 15 - 41 U/L - - 55(H)  ALT 0 - 44 U/L - - 22     Radiology Studies: DG Chest Port 1 View  Result Date: 09/20/2020 CLINICAL DATA:  Shortness of breath EXAM: PORTABLE CHEST 1 VIEW COMPARISON:  09/06/2020 FINDINGS: Cardiac shadow is within normal limits. Lungs are well aerated bilaterally. No focal infiltrate or effusion is seen. No bony abnormality is noted. IMPRESSION: No active disease. Electronically Signed   By: Inez Catalina M.D.   On: 09/20/2020 18:50     Scheduled Meds:  apixaban  5 mg Oral BID   buprenorphine-naloxone  1 tablet Sublingual BID   furosemide  80 mg Intravenous BID   gabapentin  800 mg Oral TID   metoprolol tartrate  12.5 mg Oral TID   pantoprazole  40 mg Oral BID   potassium chloride SA  40 mEq Oral TID   pravastatin  20 mg Oral QHS   sertraline  12.5 mg Oral Daily   sodium chloride flush  3 mL Intravenous Q12H   spironolactone  50 mg Oral Daily   tamsulosin  0.4 mg Oral Daily   zolpidem  10 mg Oral QHS   Continuous Infusions:  sodium chloride       LOS: 0 days   Time spent: [place],  MD [facility] To contact the attending provider between 7A-7P or the covering provider during after hours 7P-7A, please log into the web site [email or web address] and access using universal  password for that web site. If you do not have the password, please call the hospital operator.  09/22/2020, 9:34 AM

## 2020-09-23 DIAGNOSIS — E662 Morbid (severe) obesity with alveolar hypoventilation: Secondary | ICD-10-CM | POA: Diagnosis present

## 2020-09-23 DIAGNOSIS — K7460 Unspecified cirrhosis of liver: Secondary | ICD-10-CM | POA: Diagnosis present

## 2020-09-23 DIAGNOSIS — E871 Hypo-osmolality and hyponatremia: Secondary | ICD-10-CM | POA: Diagnosis present

## 2020-09-23 DIAGNOSIS — I50813 Acute on chronic right heart failure: Secondary | ICD-10-CM | POA: Diagnosis not present

## 2020-09-23 DIAGNOSIS — I471 Supraventricular tachycardia: Secondary | ICD-10-CM | POA: Diagnosis present

## 2020-09-23 DIAGNOSIS — Z20822 Contact with and (suspected) exposure to covid-19: Secondary | ICD-10-CM | POA: Diagnosis present

## 2020-09-23 DIAGNOSIS — Z6841 Body Mass Index (BMI) 40.0 and over, adult: Secondary | ICD-10-CM | POA: Diagnosis not present

## 2020-09-23 DIAGNOSIS — I110 Hypertensive heart disease with heart failure: Secondary | ICD-10-CM | POA: Diagnosis present

## 2020-09-23 DIAGNOSIS — E876 Hypokalemia: Secondary | ICD-10-CM | POA: Diagnosis present

## 2020-09-23 DIAGNOSIS — I480 Paroxysmal atrial fibrillation: Secondary | ICD-10-CM | POA: Diagnosis present

## 2020-09-23 DIAGNOSIS — N3941 Urge incontinence: Secondary | ICD-10-CM | POA: Diagnosis present

## 2020-09-23 DIAGNOSIS — N401 Enlarged prostate with lower urinary tract symptoms: Secondary | ICD-10-CM | POA: Diagnosis present

## 2020-09-23 DIAGNOSIS — I5043 Acute on chronic combined systolic (congestive) and diastolic (congestive) heart failure: Secondary | ICD-10-CM | POA: Diagnosis present

## 2020-09-23 DIAGNOSIS — J9601 Acute respiratory failure with hypoxia: Secondary | ICD-10-CM | POA: Diagnosis present

## 2020-09-23 DIAGNOSIS — K766 Portal hypertension: Secondary | ICD-10-CM | POA: Diagnosis present

## 2020-09-23 DIAGNOSIS — R42 Dizziness and giddiness: Secondary | ICD-10-CM | POA: Diagnosis present

## 2020-09-23 DIAGNOSIS — R0602 Shortness of breath: Secondary | ICD-10-CM | POA: Diagnosis present

## 2020-09-23 DIAGNOSIS — Z86718 Personal history of other venous thrombosis and embolism: Secondary | ICD-10-CM | POA: Diagnosis not present

## 2020-09-23 DIAGNOSIS — Z86711 Personal history of pulmonary embolism: Secondary | ICD-10-CM | POA: Diagnosis not present

## 2020-09-23 DIAGNOSIS — D638 Anemia in other chronic diseases classified elsewhere: Secondary | ICD-10-CM | POA: Diagnosis present

## 2020-09-23 DIAGNOSIS — Z9119 Patient's noncompliance with other medical treatment and regimen: Secondary | ICD-10-CM | POA: Diagnosis not present

## 2020-09-23 DIAGNOSIS — I5082 Biventricular heart failure: Secondary | ICD-10-CM | POA: Diagnosis present

## 2020-09-23 DIAGNOSIS — K7581 Nonalcoholic steatohepatitis (NASH): Secondary | ICD-10-CM | POA: Diagnosis present

## 2020-09-23 DIAGNOSIS — J449 Chronic obstructive pulmonary disease, unspecified: Secondary | ICD-10-CM | POA: Diagnosis present

## 2020-09-23 DIAGNOSIS — D6859 Other primary thrombophilia: Secondary | ICD-10-CM | POA: Diagnosis present

## 2020-09-23 DIAGNOSIS — E119 Type 2 diabetes mellitus without complications: Secondary | ICD-10-CM | POA: Diagnosis present

## 2020-09-23 LAB — BASIC METABOLIC PANEL
Anion gap: 10 (ref 5–15)
Anion gap: 11 (ref 5–15)
BUN: 15 mg/dL (ref 8–23)
BUN: 14 mg/dL (ref 8–23)
CO2: 32 mmol/L (ref 22–32)
CO2: 34 mmol/L — ABNORMAL HIGH (ref 22–32)
Calcium: 8.9 mg/dL (ref 8.9–10.3)
Calcium: 9.1 mg/dL (ref 8.9–10.3)
Chloride: 91 mmol/L — ABNORMAL LOW (ref 98–111)
Chloride: 88 mmol/L — ABNORMAL LOW (ref 98–111)
Creatinine, Ser: 0.94 mg/dL (ref 0.61–1.24)
Creatinine, Ser: 0.92 mg/dL (ref 0.61–1.24)
GFR, Estimated: >60 mL/min (ref >60)
GFR, Estimated: >60 mL/min (ref >60)
Glucose, Bld: 116 mg/dL — ABNORMAL HIGH (ref 70–99)
Glucose, Bld: 103 mg/dL — ABNORMAL HIGH (ref 70–99)
Potassium: 2.9 mmol/L — ABNORMAL LOW (ref 3.5–5.1)
Potassium: 2.8 mmol/L — ABNORMAL LOW (ref 3.5–5.1)
Sodium: 133 mmol/L — ABNORMAL LOW (ref 135–145)
Sodium: 133 mmol/L — ABNORMAL LOW (ref 135–145)

## 2020-09-23 LAB — MAGNESIUM: Magnesium: 2.3 mg/dL (ref 1.7–2.4)

## 2020-09-23 MED ORDER — METOLAZONE 2.5 MG PO TABS
2.5000 mg | ORAL_TABLET | Freq: Once | ORAL | Status: AC
  Administered 2020-09-23: 2.5 mg via ORAL
  Filled 2020-09-23: qty 1

## 2020-09-23 MED ORDER — POTASSIUM CHLORIDE 20 MEQ PO PACK
40.0000 meq | PACK | Freq: Once | ORAL | Status: AC
  Administered 2020-09-23: 40 meq via ORAL
  Filled 2020-09-23: qty 2

## 2020-09-23 MED ORDER — POTASSIUM CHLORIDE CRYS ER 20 MEQ PO TBCR
40.0000 meq | EXTENDED_RELEASE_TABLET | Freq: Once | ORAL | Status: AC
  Administered 2020-09-23: 40 meq via ORAL
  Filled 2020-09-23: qty 2

## 2020-09-23 MED ORDER — POTASSIUM CHLORIDE CRYS ER 20 MEQ PO TBCR: 40.0000 meq | EXTENDED_RELEASE_TABLET | ORAL | Status: DC

## 2020-09-23 MED ORDER — METOPROLOL SUCCINATE ER 25 MG PO TB24
25.0000 mg | ORAL_TABLET | Freq: Two times a day (BID) | ORAL | Status: DC
  Administered 2020-09-23 – 2020-09-26 (×7): 25 mg via ORAL
  Filled 2020-09-23 (×7): qty 1

## 2020-09-23 MED ORDER — POTASSIUM CHLORIDE 10 MEQ/100ML IV SOLN
10.0000 meq | INTRAVENOUS | Status: DC
  Administered 2020-09-23: 10 meq via INTRAVENOUS
  Filled 2020-09-23: qty 100

## 2020-09-23 NOTE — Progress Notes (Signed)
PT walked with patient, measuring pulse ox. Saturations maintained in the mid 90s, doesn't qualify for home O2.

## 2020-09-23 NOTE — Plan of Care (Signed)

## 2020-09-23 NOTE — Progress Notes (Signed)
Orthopedic Tech Progress Note Patient Details:  Charles Gray [birth year] [id number]  Ortho Devices Type of Ortho Device: Haematologist Ortho Device/Splint Location: Bilateral Lower Extremity Ortho Device/Splint Interventions: Ordered, Application, Adjustment   Post Interventions Patient Tolerated: Well Instructions Provided: Adjustment of device, Care of device, Poper ambulation with device  Charles Gray 09/23/2020, 2:38 PM

## 2020-09-23 NOTE — Progress Notes (Signed)
Patient ID: Charles Gray, male   DOB: Apr 15, 1955, 65 y.o.   MRN: [id number]     Advanced Heart Failure Rounding Note  PCP-Cardiologist: Godfrey Pick Tobb, DO   Subjective:     Good diuresis yesterday with IV Lasix and dose of metolazone.  Weight down 3 lbs.  Still reports dyspnea.    Objective:   Weight Range: (!) 169.4 kg Body mass index is 39.05 kg/m.   Vital Signs:   Temp:  [97.7 F (36.5 C)-98.7 F (37.1 C)] 98 F (36.7 C) (06/13 0530) Pulse Rate:  [68-76] 68 (06/13 0530) Resp:  [18-20] 18 (06/13 0530) BP: (117-137)/(70-84) 125/84 (06/13 0530) SpO2:  [98 %-100 %] 100 % (06/13 0530) Weight:  [169.4 kg] 169.4 kg (06/13 0219) Last BM Date: 09/22/20  Weight change: Filed Weights   09/21/20 0402 09/22/20 0022 09/23/20 0219  Weight: (!) 179.4 kg (!) 170.9 kg (!) 169.4 kg    Intake/Output:   Intake/Output Summary (Last 24 hours) at 09/23/2020 0831 Last data filed at 09/23/2020 0640 Gross per 24 hour  Intake 1923 ml  Output 5000 ml  Net -3077 ml      Physical Exam    General:  Well appearing. No resp difficulty HEENT: Normal Neck: Supple. JVP 12 cm. Carotids 2+ bilat; no bruits. No lymphadenopathy or thyromegaly appreciated. Cor: PMI nondisplaced. Regular rate & rhythm. No rubs, gallops or murmurs. Lungs: Clear Abdomen: Soft, nontender, nondistended. No hepatosplenomegaly. No bruits or masses. Good bowel sounds. Extremities: No cyanosis, clubbing, rash. 1+ edema to knees.  Neuro: Alert & orientedx3, cranial nerves grossly intact. moves all 4 extremities w/o difficulty. Affect pleasant   Telemetry   NSR 60s (personally reviewed)  Labs    CBC Recent Labs    09/20/20 1604  WBC 7.6  HGB 11.3*  HCT 37.5*  MCV 79.4*  PLT 502   Basic Metabolic Panel Recent Labs    09/21/20 0837 09/22/20 0244 09/23/20 0628  NA  --  134* 133*  K  --  3.4* 2.9*  CL  --  93* 91*  CO2  --  33* 32  GLUCOSE  --  124* 116*  BUN  --  14 15  CREATININE  --  0.93 0.94   CALCIUM  --  9.3 8.9  MG 2.2 2.3  --    Liver Function Tests Recent Labs    09/20/20 1604  AST 55*  ALT 22  ALKPHOS 74  BILITOT 1.4*  PROT 6.9  ALBUMIN 3.4*   No results for input(s): LIPASE, AMYLASE in the last 72 hours. Cardiac Enzymes No results for input(s): CKTOTAL, CKMB, CKMBINDEX, TROPONINI in the last 72 hours.  BNP: BNP (last 3 results) Recent Labs    09/06/20 2151 09/10/20 1210 09/20/20 1604  BNP 83.6 20.7 21.5    ProBNP (last 3 results) No results for input(s): PROBNP in the last 8760 hours.   D-Dimer No results for input(s): DDIMER in the last 72 hours. Hemoglobin A1C No results for input(s): HGBA1C in the last 72 hours. Fasting Lipid Panel No results for input(s): CHOL, HDL, LDLCALC, TRIG, CHOLHDL, LDLDIRECT in the last 72 hours. Thyroid Function Tests No results for input(s): TSH, T4TOTAL, T3FREE, THYROIDAB in the last 72 hours.  Invalid input(s): FREET3  Other results:   Imaging    No results found.   Medications:     Scheduled Medications:  apixaban  5 mg Oral BID   buprenorphine-naloxone  1 tablet Sublingual BID   furosemide  80 mg Intravenous BID  gabapentin  800 mg Oral TID   metolazone  2.5 mg Oral Once   metoprolol succinate  25 mg Oral BID   pantoprazole  40 mg Oral BID   potassium chloride SA  40 mEq Oral TID   pravastatin  20 mg Oral QHS   sertraline  12.5 mg Oral Daily   sodium chloride flush  3 mL Intravenous Q12H   spironolactone  50 mg Oral Daily   tamsulosin  0.4 mg Oral Daily   zolpidem  10 mg Oral QHS    Infusions:  sodium chloride     potassium chloride      PRN Medications: sodium chloride, acetaminophen, albuterol, ondansetron (ZOFRAN) IV, polyethylene glycol, sodium chloride flush   Assessment/Plan   1. Acute on chronic diastolic CHF: Suspect prominent RV dysfunction.  Echo in 12/21 with EF 30-94%, grade 2 diastolic dysfunction, RV poorly visualized.  Multiple CHF admissions.  He is volume  overloaded.  - Lasix 80 mg IV bid with dose of metolazone today.  Replace K aggressively, repeat BMET in pm.    - Ideally, would have Cardiomems but he declines.   - He did not tolerate jardiance due to GI upset. - Continue spironolactone 50 mg daily, eplerenone at discharge.  2. H/o PE/DVT: Suspect due to sedentary lifestyle.  Occurred in 2/21.  V/Q scan in 6/21 did not show evidence for chronic PE.   - Continue apixaban. 3. OSA: Cannot tolerate CPAP. 4. Anxiety/panic: - Continue sertraline 25 mg daily. 5. CAD: Cath 1/22 with no significant CAD.  6. Hypertension: Stable.  - Continue Toprol XL 25 mg daily  7. Atrial fibrillation: Paroxysmal. NSR today.  -  Continue apixaban.   Length of Stay: 0  Loralie Champagne, MD  09/23/2020, 8:31 AM  Advanced Heart Failure Team Pager [phone] (M-F; 7a - 5p)  Please contact [facility] for night-coverage after hours (5p -7a ) and weekends on [email or web address]

## 2020-09-23 NOTE — Progress Notes (Signed)
PROGRESS NOTE   Charles Gray  YJE:[id number] DOB: 08/04/1955 DOA: 09/20/2020 PCP: Enid Skeens., MD  Brief Narrative:   53 white male BMI 41 DVT PE 05/2019 prior Eliquis HFpEF + primary RV dysfunction-multiple admissions 10/2019 01/2020 03/2020 [facility] Supposed to be on BiPAP for OSA-cannot tolerate even nasal pillows Paroxysmal A. fib CHADS2 score >3 GI bleed 01/2019, [name], Chr. LBP   follows with advanced heart failure team-last saw [name] Clegg 09/10/2020  Weight at that visit 169 k--started subsequently torsemide 100 with tapered to 80 twice daily subsequently metolazone was added potassium was added and Zio patch was placed secondary to patient having cold sweats Patient called paramedics and 6/7 feeling "sick"-"stomach paining legs hurting"-he has been having issues with urinary incontinence and what sounds like overactive bladder He was advised to keep his follow-up appointment on 6/9 follow-up telephone call with Dr. Algernon Huxley 6/28 but presented to the emergency room after still feeling terrible on 6/10 follow-up phone call  Was admitted for acute superimposed on chronic CHF with 10 kg weight gain   Hospital-Problem based course  Acute superimposed on chronic HFpEF Appreciate in advance cardiology input coherent awake alert thick neck cannot appreciate JVD CTA B no rales no rhonchi  Continue Zaroxolyn 2.5 in a.m., continue Lasix 80 twice daily, Aldactone 50 [Inspra substitution] Current I/O = -3.8 L, WG Ht 175-->169 Metoprolol changed to 12.5 to 3 times daily-see below Unna boots replaced today and he is comfortable Paroxysmal A. Fib CHADS2 score >3 Episodic SVT documented on 6/11 on telemetry Continue apixaban 5 twice daily-keep on monitors tonight OSA BiPAP intolerant Needs outpatient pulmonary follow-up given intolerance of CPAP Probable chronic low back pain Buprenorphine twice daily, gabapentin 800 3 times daily should be de-escalated in outpatient setting by PCP/pain  management Bowel regimen with MiraLAX Prior GI bleed Probable BPH/urge incontinence Start Flomax 0.4 and monitor Hypokalemia Replace potassium iv x/day 40 mg p.o. as he is unable to tolerate runs of potassium DVT prophylaxis: Eliquis Code Status: Full Family Communication: None Disposition:  Status is: Observation  The patient remains OBS appropriate and will d/c before 2 midnights.  Dispo: The patient is from: Home              Anticipated d/c is to: Home              Patient currently is not medically stable to d/c.   Difficult to place patient No   Consultants:  HF team  Procedures:   Antimicrobials:     Subjective: No current reports of neck pain Looks well feels well Eating and drinking okay Weight is down - Fever - chills - nausea vomiting - SOB   Objective: Vitals:   09/22/20 1957 09/23/20 0219 09/23/20 0530 09/23/20 1151  BP: 117/81  125/84 (!) 150/98  Pulse: 73  68 80  Resp: 20  18 18   Temp: 98.7 F (37.1 C)  98 F (36.7 C) 98 F (36.7 C)  TempSrc: Oral   Oral  SpO2: 98%  100% 99%  Weight:  (!) 169.4 kg    Height:        Intake/Output Summary (Last 24 hours) at 09/23/2020 1556 Last data filed at 09/23/2020 1157 Gross per 24 hour  Intake 1803 ml  Output 3900 ml  Net -2097 ml    Filed Weights   09/21/20 0402 09/22/20 0022 09/23/20 0219  Weight: (!) 179.4 kg (!) 170.9 kg (!) 169.4 kg    Examination: Awake coherent alert no distress EOMI NCAT  no focal deficit S1-S2 no murmur seems to be in sinus-I cannot appreciate any JVD given his habitus and thick beard Abdomen obese nontender no rebound no guarding Unna boots around bilaterally and both legs ROM intact ambulatory Neurologically grossly intact Psych euthymic congruent pleasant  Data Reviewed: personally reviewed   CBC    Component Value Date/Time   WBC 7.6 09/20/2020 1604   RBC 4.72 09/20/2020 1604   HGB 11.3 (L) 09/20/2020 1604   HGB 13.0 03/06/2020 1509   HCT 37.5 (L)  09/20/2020 1604   HCT 40.4 03/06/2020 1509   PLT 160 09/20/2020 1604   PLT 179 03/06/2020 1509   MCV 79.4 (L) 09/20/2020 1604   MCV 79 03/06/2020 1509   MCH 23.9 (L) 09/20/2020 1604   MCHC 30.1 09/20/2020 1604   RDW 16.2 (H) 09/20/2020 1604   RDW 15.7 (H) 03/06/2020 1509   LYMPHSABS 0.8 09/06/2020 2151   MONOABS 0.6 09/06/2020 2151   EOSABS 0.2 09/06/2020 2151   BASOSABS 0.0 09/06/2020 2151   CMP Latest Ref Rng & Units 09/23/2020 09/22/2020 09/21/2020  Glucose 70 - 99 mg/dL 116(H) 124(H) 125(H)  BUN 8 - 23 mg/dL 15 14 13   Creatinine 0.61 - 1.24 mg/dL 0.94 0.93 1.02  Sodium 135 - 145 mmol/L 133(L) 134(L) 136  Potassium 3.5 - 5.1 mmol/L 2.9(L) 3.4(L) 3.1(L)  Chloride 98 - 111 mmol/L 91(L) 93(L) 90(L)  CO2 22 - 32 mmol/L 32 33(H) 36(H)  Calcium 8.9 - 10.3 mg/dL 8.9 9.3 9.3  Total Protein 6.5 - 8.1 g/dL - - -  Total Bilirubin 0.3 - 1.2 mg/dL - - -  Alkaline Phos 38 - 126 U/L - - -  AST 15 - 41 U/L - - -  ALT 0 - 44 U/L - - -     Radiology Studies: No results found.   Scheduled Meds:  apixaban  5 mg Oral BID   buprenorphine-naloxone  1 tablet Sublingual BID   furosemide  80 mg Intravenous BID   gabapentin  800 mg Oral TID   metoprolol succinate  25 mg Oral BID   pantoprazole  40 mg Oral BID   potassium chloride  40 mEq Oral Once   potassium chloride SA  40 mEq Oral TID   pravastatin  20 mg Oral QHS   sertraline  12.5 mg Oral Daily   sodium chloride flush  3 mL Intravenous Q12H   spironolactone  50 mg Oral Daily   tamsulosin  0.4 mg Oral Daily   zolpidem  10 mg Oral QHS   Continuous Infusions:  sodium chloride       LOS: 0 days   Time spent: 72  Nita Sells, MD [facility] To contact the attending provider between 7A-7P or the covering provider during after hours 7P-7A, please log into the web site [email or web address] and access using universal [place] password for that web site. If you do not have the password, please call the hospital  operator.  09/23/2020, 3:56 PM

## 2020-09-23 NOTE — Evaluation (Signed)
Physical Therapy Evaluation Patient Details Name: Charles Gray MRN: [id number] DOB: 12-12-1955 Today's Date: 09/23/2020   History of Present Illness  Patient is a 65 y/o male who presents on 6/10 with SOB, LE edema and chest heaviness. Admitted with respiratory failure secondary to acute on chronic CHF.  PMH includes obesity, DVT/PE, cirrhosis, HTN, A-fib, chronic pain, CAD, insomnia, CHF.  Clinical Impression  Patient presents with pain, dyspnea on exertion, decreased cardiovascular endurance and impaired mobility s/p above. Pt lives at home alone and has a PCA come in for 1.5 hours 4 days/week to assist with IADLs and getting in/out of tub, and uses RW for ambulation. Today, pt tolerated transfers and gait training trialing rollator with supervision for safety. Requires seated rest break due to fatigue and SOB. SP02 remained >97% on RA throughout activity. Pt limited with leaving house and mobility due to SOB and poor endurance. Would benefit from having rollator to assist with improving overall activity tolerance and energy conservation at home. Will follow acutely to maximize independence and mobility prior to return home.    Follow Up Recommendations No PT follow up    Equipment Recommendations  Other (comment) (bariatric rollator)    Recommendations for Other Services       Precautions / Restrictions Precautions Precautions: Fall Restrictions Weight Bearing Restrictions: No      Mobility  Bed Mobility Overal bed mobility: Modified Independent             General bed mobility comments: Use of rail and HOB elevated, but no assist needed.    Transfers Overall transfer level: Needs assistance Equipment used: 4-wheeled walker Transfers: Sit to/from Stand Sit to Stand: Supervision;From elevated surface         General transfer comment: Supervision for safety from elevated bed height. Stood from [employer].  Ambulation/Gait Ambulation/Gait assistance:  Supervision Gait Distance (Feet): 120 Feet (x2 bouts) Assistive device: 4-wheeled walker Gait Pattern/deviations: Step-through pattern;Decreased stride length;Trunk flexed Gait velocity: decreased   General Gait Details: Slow, steady gait with rollator for support (rollator too short for height, pt is 6'10) Cues on how to lock/unlock brakes. 1 seated rest break. Sp02 remained >97% on RA throughout with 2/4 DOE.  Stairs            Wheelchair Mobility    Modified Rankin (Stroke Patients Only)       Balance Overall balance assessment: Needs assistance Sitting-balance support: Feet supported;No upper extremity supported Sitting balance-Leahy Scale: Good Sitting balance - Comments: Able to donn socks sitting EOB with increased time bringing foot onto thigh   Standing balance support: During functional activity Standing balance-Leahy Scale: Fair Standing balance comment: Does better with at least 1 UE support.                             Pertinent Vitals/Pain Pain Assessment: Faces Faces Pain Scale: Hurts even more Pain Location: BLEs Pain Descriptors / Indicators: Sore Pain Intervention(s): Monitored during session;Repositioned    Home Living Family/patient expects to be discharged to:: Private residence Living Arrangements:  (with his cat) Available Help at Discharge: Personal care attendant Type of Home: Apartment Home Access: Stairs to enter Entrance Stairs-Rails: Right;Left;Can reach both Entrance Stairs-Number of Steps: 14 Home Layout: One level Home Equipment: Tub bench;Walker - 2 wheels;Hand held shower head;Hospital bed      Prior Function Level of Independence: Needs assistance   Gait / Transfers Assistance Needed: using walker for gait  ADL's /  Homemaking Assistance Needed: PCA assists with getting in/out of tub/bathing and dressing assist 4x/wk, assist for homemaking, groceries delivered  Comments: Has HHRN 1.5 hrs 4 days/week     Hand  Dominance   Dominant Hand: Right    Extremity/Trunk Assessment   Upper Extremity Assessment Upper Extremity Assessment: Defer to OT evaluation    Lower Extremity Assessment Lower Extremity Assessment: Overall WFL for tasks assessed (Some blisters weeping on distal BLEs. Unna boots were just removed. Reports improved swelling)    Cervical / Trunk Assessment Cervical / Trunk Assessment: Kyphotic  Communication   Communication: No difficulties  Cognition Arousal/Alertness: Awake/alert Behavior During Therapy: WFL for tasks assessed/performed Overall Cognitive Status: Within Functional Limits for tasks assessed                                        General Comments General comments (skin integrity, edema, etc.): SP02 remained >97% on RA throughout activity.    Exercises     Assessment/Plan    PT Assessment Patient needs continued PT services  PT Problem List Decreased activity tolerance;Cardiopulmonary status limiting activity;Decreased skin integrity       PT Treatment Interventions Therapeutic exercise;DME instruction;Patient/family education;Therapeutic activities;Functional mobility training;Stair training;Gait training    PT Goals (Current goals can be found in the Care Plan section)  Acute Rehab PT Goals Patient Stated Goal: to get better, be able to breathe and stop coming to the hospital PT Goal Formulation: With patient Time For Goal Achievement: 10/07/20 Potential to Achieve Goals: Good    Frequency Min 3X/week   Barriers to discharge Decreased caregiver support;Inaccessible home environment stairs to get into apt    Co-evaluation               AM-PAC PT "6 Clicks" Mobility  Outcome Measure Help needed turning from your back to your side while in a flat bed without using bedrails?: None Help needed moving from lying on your back to sitting on the side of a flat bed without using bedrails?: None Help needed moving to and from a bed  to a chair (including a wheelchair)?: A Little Help needed standing up from a chair using your arms (e.g., wheelchair or bedside chair)?: A Little Help needed to walk in hospital room?: A Little Help needed climbing 3-5 steps with a railing? : A Little 6 Click Score: 20    End of Session   Activity Tolerance: Patient tolerated treatment well Patient left: in bed;with call bell/phone within reach (sitting EOB eating lunch) Nurse Communication: Mobility status;Other (comment) (does not need 02 and left on RA) PT Visit Diagnosis: Difficulty in walking, not elsewhere classified (R26.2);Other (comment) (DOE)    Time: 7948-0165 PT Time Calculation (min) (ACUTE ONLY): 28 min   Charges:   PT Evaluation $PT Eval Low Complexity: 1 Low PT Treatments $Gait Training: 8-22 mins        Marisa Severin, PT, DPT Acute Rehabilitation Services Pager [phone] Office [place] 09/23/2020, 1:09 PM

## 2020-09-24 LAB — HEPATIC FUNCTION PANEL
ALT: 23 U/L (ref 0–44)
AST: 36 U/L (ref 15–41)
Albumin: 3.3 g/dL — ABNORMAL LOW (ref 3.5–5.0)
Alkaline Phosphatase: 71 U/L (ref 38–126)
Bilirubin, Direct: 0.2 mg/dL (ref 0.0–0.2)
Indirect Bilirubin: 0.6 mg/dL (ref 0.3–0.9)
Total Bilirubin: 0.8 mg/dL (ref 0.3–1.2)
Total Protein: 7.1 g/dL (ref 6.5–8.1)

## 2020-09-24 LAB — MAGNESIUM: Magnesium: 2.3 mg/dL (ref 1.7–2.4)

## 2020-09-24 LAB — BASIC METABOLIC PANEL
Anion gap: 9 (ref 5–15)
Anion gap: 11 (ref 5–15)
BUN: 16 mg/dL (ref 8–23)
BUN: 18 mg/dL (ref 8–23)
CO2: 36 mmol/L — ABNORMAL HIGH (ref 22–32)
CO2: 30 mmol/L (ref 22–32)
Calcium: 9.4 mg/dL (ref 8.9–10.3)
Calcium: 9.7 mg/dL (ref 8.9–10.3)
Chloride: 89 mmol/L — ABNORMAL LOW (ref 98–111)
Chloride: 89 mmol/L — ABNORMAL LOW (ref 98–111)
Creatinine, Ser: 0.97 mg/dL (ref 0.61–1.24)
Creatinine, Ser: 1.06 mg/dL (ref 0.61–1.24)
GFR, Estimated: >60 mL/min (ref >60)
GFR, Estimated: >60 mL/min (ref >60)
Glucose, Bld: 110 mg/dL — ABNORMAL HIGH (ref 70–99)
Glucose, Bld: 111 mg/dL — ABNORMAL HIGH (ref 70–99)
Potassium: 3.3 mmol/L — ABNORMAL LOW (ref 3.5–5.1)
Potassium: 3.4 mmol/L — ABNORMAL LOW (ref 3.5–5.1)
Sodium: 134 mmol/L — ABNORMAL LOW (ref 135–145)
Sodium: 130 mmol/L — ABNORMAL LOW (ref 135–145)

## 2020-09-24 LAB — CBC WITH DIFFERENTIAL/PLATELET
Abs Immature Granulocytes: 0.02 10*3/uL (ref 0.00–0.07)
Basophils Absolute: 0 10*3/uL (ref 0.0–0.1)
Basophils Relative: 0 %
Eosinophils Absolute: 0.3 10*3/uL (ref 0.0–0.5)
Eosinophils Relative: 4 %
HCT: 36.2 % — ABNORMAL LOW (ref 39.0–52.0)
Hemoglobin: 11.1 g/dL — ABNORMAL LOW (ref 13.0–17.0)
Immature Granulocytes: 0 %
Lymphocytes Relative: 17 %
Lymphs Abs: 1.1 10*3/uL (ref 0.7–4.0)
MCH: 24.3 pg — ABNORMAL LOW (ref 26.0–34.0)
MCHC: 30.7 g/dL (ref 30.0–36.0)
MCV: 79.2 fL — ABNORMAL LOW (ref 80.0–100.0)
Monocytes Absolute: 0.7 10*3/uL (ref 0.1–1.0)
Monocytes Relative: 11 %
Neutro Abs: 4.6 10*3/uL (ref 1.7–7.7)
Neutrophils Relative %: 68 %
Platelets: 144 10*3/uL — ABNORMAL LOW (ref 150–400)
RBC: 4.57 MIL/uL (ref 4.22–5.81)
RDW: 16.1 % — ABNORMAL HIGH (ref 11.5–15.5)
WBC: 6.8 10*3/uL (ref 4.0–10.5)
nRBC: 0 % (ref 0.0–0.2)

## 2020-09-24 MED ORDER — ACETAZOLAMIDE 250 MG PO TABS
250.0000 mg | ORAL_TABLET | Freq: Two times a day (BID) | ORAL | Status: DC
  Administered 2020-09-24: 250 mg via ORAL
  Filled 2020-09-24: qty 1

## 2020-09-24 MED ORDER — POTASSIUM CHLORIDE 20 MEQ PO PACK
40.0000 meq | PACK | Freq: Once | ORAL | Status: AC
  Administered 2020-09-24: 40 meq via ORAL
  Filled 2020-09-24: qty 2

## 2020-09-24 MED ORDER — METOLAZONE 2.5 MG PO TABS
2.5000 mg | ORAL_TABLET | Freq: Once | ORAL | Status: AC
  Administered 2020-09-24: 2.5 mg via ORAL
  Filled 2020-09-24: qty 1

## 2020-09-24 MED ORDER — POTASSIUM CHLORIDE CRYS ER 20 MEQ PO TBCR: 20.0000 meq | EXTENDED_RELEASE_TABLET | Freq: Once | ORAL | Status: DC

## 2020-09-24 NOTE — Progress Notes (Addendum)
Patient ID: Charles Gray, male   DOB: [birth year], 65 y.o.   MRN: [id number]     Advanced Heart Failure Rounding Note  PCP-Cardiologist: Godfrey Pick Tobb, DO   Subjective:   Weight 387--->372   Yesterday diuresed with IV lasix + metolazone. Negative 2.3 liters.    Feels a little better.    Objective:   Weight Range: (!) 168.8 kg Body mass index is 38.92 kg/m.   Vital Signs:   Temp:  [97.7 F (36.5 C)-99.2 F (37.3 C)] 97.7 F (36.5 C) (06/14 0357) Pulse Rate:  [62-80] 62 (06/14 0357) Resp:  [18-20] 20 (06/14 0357) BP: (115-150)/(67-98) 115/67 (06/14 0357) SpO2:  [96 %-100 %] 100 % (06/14 0357) Weight:  [168.8 kg] 168.8 kg (06/14 0354) Last BM Date: 09/22/20  Weight change: Filed Weights   09/22/20 0022 09/23/20 0219 09/24/20 0354  Weight: (!) 170.9 kg (!) 169.4 kg (!) 168.8 kg    Intake/Output:   Intake/Output Summary (Last 24 hours) at 09/24/2020 0735 Last data filed at 09/24/2020 0400 Gross per 24 hour  Intake 1203 ml  Output 3600 ml  Net -2397 ml      Physical Exam   General:  Sitting on the side of the bed.  No resp difficulty HEENT: normal Neck: supple. JVP 9-10. Carotids 2+ bilat; no bruits. No lymphadenopathy or thryomegaly appreciated. Cor: PMI nondisplaced. Regular rate & rhythm. No rubs, gallops or murmurs. Lungs: clear Abdomen: soft, nontender, nondistended. No hepatosplenomegaly. No bruits or masses. Good bowel sounds. Extremities: no cyanosis, clubbing, rash, R and LLE unna boots.  Neuro: alert & orientedx3, cranial nerves grossly intact. moves all 4 extremities w/o difficulty. Affect pleasant   Telemetry  NSR 60 personally reviewed..  Labs    CBC Recent Labs    09/24/20 0337  WBC 6.8  NEUTROABS 4.6  HGB 11.1*  HCT 36.2*  MCV 79.2*  PLT 846*   Basic Metabolic Panel Recent Labs    09/23/20 0628 09/23/20 1620 09/24/20 0337  NA 133* 133* 134*  K 2.9* 2.8* 3.3*  CL 91* 88* 89*  CO2 32 34* 36*  GLUCOSE 116* 103* 110*  BUN 15 14  16   CREATININE 0.94 0.92 0.97  CALCIUM 8.9 9.1 9.4  MG 2.3  --  2.3   Liver Function Tests Recent Labs    09/24/20 0337  AST 36  ALT 23  ALKPHOS 71  BILITOT 0.8  PROT 7.1  ALBUMIN 3.3*   No results for input(s): LIPASE, AMYLASE in the last 72 hours. Cardiac Enzymes No results for input(s): CKTOTAL, CKMB, CKMBINDEX, TROPONINI in the last 72 hours.  BNP: BNP (last 3 results) Recent Labs    09/06/20 2151 09/10/20 1210 09/20/20 1604  BNP 83.6 20.7 21.5    ProBNP (last 3 results) No results for input(s): PROBNP in the last 8760 hours.   D-Dimer No results for input(s): DDIMER in the last 72 hours. Hemoglobin A1C No results for input(s): HGBA1C in the last 72 hours. Fasting Lipid Panel No results for input(s): CHOL, HDL, LDLCALC, TRIG, CHOLHDL, LDLDIRECT in the last 72 hours. Thyroid Function Tests No results for input(s): TSH, T4TOTAL, T3FREE, THYROIDAB in the last 72 hours.  Invalid input(s): FREET3  Other results:   Imaging    No results found.   Medications:     Scheduled Medications:  apixaban  5 mg Oral BID   buprenorphine-naloxone  1 tablet Sublingual BID   furosemide  80 mg Intravenous BID   gabapentin  800 mg Oral TID  metoprolol succinate  25 mg Oral BID   pantoprazole  40 mg Oral BID   potassium chloride SA  40 mEq Oral TID   pravastatin  20 mg Oral QHS   sertraline  12.5 mg Oral Daily   sodium chloride flush  3 mL Intravenous Q12H   spironolactone  50 mg Oral Daily   tamsulosin  0.4 mg Oral Daily   zolpidem  10 mg Oral QHS    Infusions:  sodium chloride      PRN Medications: sodium chloride, acetaminophen, albuterol, ondansetron (ZOFRAN) IV, polyethylene glycol, sodium chloride flush   Assessment/Plan   1. Acute on chronic diastolic CHF: Suspect prominent RV dysfunction.  Echo in 12/21 with EF 70-96%, grade 2 diastolic dysfunction, RV poorly visualized.  Multiple CHF admissions.   Remains volume overloaded.  - Continue IV  lasix 80 mg twice a day and give another dose metolazone. At home he was taking torsemide 80 /80 + metolazone on Friday.  - Supp K.    - Ideally, would have Cardiomems but he declines.   - He did not tolerate jardiance due to GI upset. - Continue spironolactone 50 mg daily, eplerenone at discharge.  Renal function stable.  2. H/o PE/DVT: Suspect due to sedentary lifestyle.  Occurred in 2/21.  V/Q scan in 6/21 did not show evidence for chronic PE.   - Continue apixaban. 3. OSA: Cannot tolerate CPAP. 4. Anxiety/panic: - Continue sertraline 25 mg daily. 5. CAD: Cath 1/22 with no significant CAD.  6. Hypertension: Stable.  - Continue Toprol XL 25 mg daily  7. Atrial fibrillation: Paroxysmal. In NSR today.   -  Continue apixaban.   Consult cardiac rehab.  Length of Stay: 1  [name] Clegg, NP  09/24/2020, 7:35 AM  Advanced Heart Failure Team Pager [phone] (M-F; 7a - 5p)  Please contact [facility] for night-coverage after hours (5p -7a ) and weekends on [email or web address]   Patient seen with NP, agree with the above note.   Still short of breath.  Creatinine stable 0.97.   General: NAD, obese Neck: JVP 10 cm, no thyromegaly or thyroid nodule.  Lungs: Clear to auscultation bilaterally with normal respiratory effort. CV: Nondisplaced PMI.  Heart regular S1/S2, no S3/S4, no murmur.  1+ edema to knees.  Abdomen: Soft, nontender, no hepatosplenomegaly, no distention.  Skin: Intact without lesions or rashes.  Neurologic: Alert and oriented x 3.  Psych: Normal affect. Extremities: No clubbing or cyanosis.  HEENT: Normal.   Patient is still volume overloaded.  - Continue Lasix 80 mg IV bid.  - Metolazone 2.5 x 1 and replace K.  - Acetazolamide 250 mg bid with HCO3 up to 36 today.   Recommended Cardiomems, he is still not interested.   Loralie Champagne 09/24/2020 1:53 PM

## 2020-09-24 NOTE — Progress Notes (Signed)
   09/24/20 1600  Orthostatic Lying   BP- Lying 99/64  Pulse- Lying 58  Orthostatic Sitting  BP- Sitting 104/63  Pulse- Sitting 69  Orthostatic Standing at 0 minutes  BP- Standing at 0 minutes 102/64  Pulse- Standing at 0 minutes 75  Orthostatic Standing at 3 minutes  BP- Standing at 3 minutes 119/74  Pulse- Standing at 3 minutes 78  Oxygen Therapy  SpO2 98 %  O2 Device Room Air

## 2020-09-24 NOTE — TOC Initial Note (Addendum)
Transition of Care (TOC) - Initial/Assessment Note  Heart Failure   Patient Details  Name: Charles Gray MRN: [id number] Date of Birth: [birth year]  Transition of Care [facility]) CM/SW Contact:    [place], [place] Phone Number: 09/24/2020, 3:07 PM  Clinical Narrative:                 CSW spoke with the patient at bedside to check on them and they reported no new updates or social needs with SDOH. Charles Gray reported that he would like a rollator and home PT and CSW explained that PT would need to recommend that for him or it would be an out of pocket cost. Charles Gray reported having a hospital bed but that the mattress is hard and he is unable to use it and how he might be able to get a mattress so that the hospital bed is of use for him. CSW will provide the patient with medical supply store information and encourage patient to continue to engage in self-advocacy.  CSW will continue to follow throughout discharge.    Barriers to Discharge: Continued Medical Work up   Patient Goals and CMS Choice        Expected Discharge Plan and Services   In-house Referral: Clinical Social Work     Living arrangements for the past 2 months: Apartment                                      Prior Living Arrangements/Services Living arrangements for the past 2 months: Apartment Lives with:: Self Patient language and need for interpreter reviewed:: Yes        Need for Family Participation in Patient Care: No (Comment) Care giver support system in place?: No (comment)   Criminal Activity/Legal Involvement Pertinent to Current Situation/Hospitalization: No - Comment as needed  Activities of Daily Living Home Assistive Devices/Equipment: Eyeglasses, Environmental consultant (specify type) ADL Screening (condition at time of admission) Patient's cognitive ability adequate to safely complete daily activities?: Yes Is the patient deaf or have difficulty hearing?: No Does the patient have  difficulty seeing, even when wearing glasses/contacts?: No Does the patient have difficulty concentrating, remembering, or making decisions?: No Patient able to express need for assistance with ADLs?: Yes Does the patient have difficulty dressing or bathing?: No Independently performs ADLs?: Yes (appropriate for developmental age) Does the patient have difficulty walking or climbing stairs?: Yes Weakness of Legs: Both Weakness of Arms/Hands: None  Permission Sought/Granted                  Emotional Assessment Appearance:: Appears stated age Attitude/Demeanor/Rapport: Engaged Affect (typically observed): Pleasant Orientation: : Oriented to Self, Oriented to Place, Oriented to  Time, Oriented to Situation   Psych Involvement: No (comment)  Admission diagnosis:  Acute on chronic right-sided heart failure ([place]) [I50.813] Acute on chronic right-sided congestive heart failure ([place]) [I50.813] Patient Active Problem List   Diagnosis Date Noted   Chronic venous stasis dermatitis of both lower extremities 08/27/2020   Cellulitis of both lower extremities 08/14/2020   Microcytic anemia 08/14/2020   GERD without esophagitis 06/10/2020   Atrial fibrillation with rapid ventricular response ([place]) 03/24/2020   Acute on chronic right heart failure ([place]) 01/27/2020   Acute respiratory failure with hypoxia ([place])    Fatty liver disease, nonalcoholic    Shock ([place])    Personal history of DVT (deep vein thrombosis) 12/26/2019  Chronic anticoagulation 12/26/2019   Palpitations 12/26/2019   AF (paroxysmal atrial fibrillation) ([place]) 12/23/2019   Heart failure, systolic, acute ([place]) 61/22/4497   Acute on chronic congestive heart failure (HCC)    Acute on chronic diastolic CHF (congestive heart failure) ([place]) 08/23/2019   Chest pain 08/23/2019   Lactic acidosis 08/23/2019   History of pulmonary embolism 08/12/2019   Atrial tachycardia ([place]) 08/12/2019   Occasional tremors 08/12/2019   OSA  (obstructive sleep apnea) 08/12/2019   Acute on chronic right-sided heart failure ([place]) 08/06/2019   Pulmonary embolism ([place]) 06/08/2019   Normocytic anemia 06/08/2019   Pulmonary emboli ([place]) 06/08/2019   Chronic heart failure with preserved ejection fraction ([place]) 05/30/2019   Class 2 severe obesity due to excess calories with serious comorbidity and body mass index (BMI) of 39.0 to 39.9 in adult ([place]) 05/30/2019   Morbid obesity with BMI of 40.0-44.9, adult ([place]) 05/30/2019   Hyponatremia    Drug induced constipation    Peripheral edema    Hypotension due to drugs    Hypoalbuminemia due to protein-calorie malnutrition (HCC)    Hypokalemia    Thrombocytopenia (HCC)    Anemia of chronic disease    Cirrhosis of liver without ascites ([place])    Chronic pain syndrome    Debility 03/13/2019   Portal hypertensive gastropathy ([place]) 03/06/2019   Dyspnea    GIB (gastrointestinal bleeding) 03/04/2019   Mixed hyperlipidemia 01/24/2019   Insomnia 01/24/2019   Hyperlipidemia 01/24/2019   Essential hypertension 01/24/2019   Lumbar disc herniation 01/24/2019   PCP:  Enid Skeens., MD Pharmacy:   [facility], [place] [address] Phone: [phone] Fax: [place] [address] [place] [address] Phone: [phone] Fax: [place] [address] [place] [address] Phone: [phone] Fax: [phone]     Social Determinants of Health (SDOH) Interventions Food Insecurity Interventions: Intervention Not Indicated Financial Strain Interventions: Intervention Not Indicated Housing Interventions: Intervention Not Indicated Transportation Interventions: [facility]  Readmission Risk Interventions Readmission Risk Prevention Plan 06/12/2020 03/27/2020 02/02/2020  Transportation Screening Complete Complete Complete   HRI or [place] Work Consult for [place] Planning/Counseling - - -  Medication Review Press photographer) Complete Complete Complete  PCP or Specialist appointment within 3-5 days of discharge - Complete -  [place] or [place] Complete Complete Complete  SW Recovery Care/Counseling Consult Complete Complete Complete  Palliative Care Screening Not Applicable Not Applicable Not [place] Not Applicable Not Applicable Not Applicable    Nyzier Boivin, MSW, [place] Heart Failure Social Worker

## 2020-09-24 NOTE — Progress Notes (Signed)
   Dizzy this afternoon.   Orthostatic by recorded BP  Hold pm diuretics. Stop lasix and diamox.   Acquanetta Cabanilla NP-C  4:45 PM

## 2020-09-24 NOTE — Progress Notes (Signed)
CARDIAC REHAB PHASE I   PRE:  Rate/Rhythm: 76 SR    BP: sitting 103/63    SaO2: 98 RA  MODE:  Ambulation: 80 ft   POST:  Rate/Rhythm: 90 SR with PACs    BP: sitting 133/67     SaO2: 97 RA  Pt stood and walked with RW, no physical assist. Slow, bent over RW. C/o leg and right shoulder cramps entire walk. Rest against wall x1. To EOB, VSS.  [id number]   Darrick Meigs CES, ACSM 09/24/2020 2:44 PM

## 2020-09-24 NOTE — Progress Notes (Signed)
PROGRESS NOTE   Charles Gray  XLK:[id number] DOB: [birth year] DOA: 09/20/2020 PCP: Enid Skeens., MD  Brief Narrative:   89 white male BMI 41 DVT PE 05/2019 prior Eliquis HFpEF + primary RV dysfunction-multiple admissions 10/2019 01/2020 03/2020 AECHF Supposed to be on BiPAP for OSA-cannot tolerate even nasal pillows Paroxysmal A. fib CHADS2 score >3 GI bleed 01/2019, [name], Chr. LBP   follows with advanced heart failure team-last saw [name] Clegg 09/10/2020  Weight at that visit 169 k--started subsequently torsemide 100 with tapered to 80 twice daily subsequently metolazone was added potassium was added and Zio patch was placed secondary to patient having cold sweats Patient called paramedics and 6/7 feeling "sick"-"stomach paining legs hurting"-he has been having issues with urinary incontinence and what sounds like overactive bladder He was advised to keep his follow-up appointment on 6/9 follow-up telephone call with Dr. Algernon Huxley 6/28 but presented to the emergency room after still feeling terrible on 6/10 follow-up phone call  Was admitted for acute superimposed on chronic CHF with 10 kg weight gain   Hospital-Problem based course  Acute superimposed on chronic HFpEF Appreciate in advance cardiology input  Meds: Zaroxolyn 2.5 in a.m., continue Lasix 80 twice daily, Aldactone 50 [Inspra substitution] Current I/O = -4.9L, WG Ht 175-->168 Metoprolol ? 25 ii/d Unna boots Paroxysmal A. Fib CHADS2 score >3 Episodic SVT documented on 6/11 on telemetry Continue apixaban 5 twice daily No further SVT OSA BiPAP intolerant Needs outpatient pulmonary follow-up given intolerance of CPAP Probable chronic low back pain Buprenorphine twice daily, gabapentin 800 iii/d ?de-escalate by PCP/pain management Bowel regimen with MiraLAX Prior GI bleed Probable BPH/urge incontinence Start Flomax 0.4 and monitor Hypokalemia Mild contraction alkalosis 2/2 loop Replace potassium iii/day 40 mg  p.o Diamox 250 bid Labs am + mag  DVT prophylaxis: Eliquis Code Status: Full Family Communication: None Disposition:  Status is: Observation  The patient remains OBS appropriate and will d/c before 2 midnights.  Dispo: The patient is from: Home              Anticipated d/c is to: Home              Patient currently is not medically stable to d/c.   Difficult to place patient No   Consultants:  HF team  Procedures:   Antimicrobials:     Subjective:  Awake coherent pleasant Main complaint is polyuria from IV diuretics He feels somewhat uncomfortable in his neck and back- Reports some dizziness--cannot elaborate   Objective: Vitals:   09/23/20 1936 09/24/20 0354 09/24/20 0357 09/24/20 0853  BP: 132/89  115/67 121/73  Pulse: 70  62 63  Resp: 19  20 18   Temp: 99.2 F (37.3 C)  97.7 F (36.5 C) 98 F (36.7 C)  TempSrc: Oral  Oral Oral  SpO2: 96%  100% 99%  Weight:  (!) 168.8 kg    Height:        Intake/Output Summary (Last 24 hours) at 09/24/2020 1528 Last data filed at 09/24/2020 1430 Gross per 24 hour  Intake 2143 ml  Output 3200 ml  Net -1057 ml    Filed Weights   09/22/20 0022 09/23/20 0219 09/24/20 0354  Weight: (!) 170.9 kg (!) 169.4 kg (!) 168.8 kg    Examination: Awake coherent alert no distress EOMI NCAT no focal deficit S1-S2 no murmur-cannot appreciate JVD Abdomen obese nontender no rebound no guarding Unna boots re- wrapped ROM intact ambulatory Neurologically grossly intact Psych euthymic congruent pleasant  Data Reviewed: personally  reviewed   CBC    Component Value Date/Time   WBC 6.8 09/24/2020 0337   RBC 4.57 09/24/2020 0337   HGB 11.1 (L) 09/24/2020 0337   HGB 13.0 03/06/2020 1509   HCT 36.2 (L) 09/24/2020 0337   HCT 40.4 03/06/2020 1509   PLT 144 (L) 09/24/2020 0337   PLT 179 03/06/2020 1509   MCV 79.2 (L) 09/24/2020 0337   MCV 79 03/06/2020 1509   MCH 24.3 (L) 09/24/2020 0337   MCHC 30.7 09/24/2020 0337   RDW 16.1  (H) 09/24/2020 0337   RDW 15.7 (H) 03/06/2020 1509   LYMPHSABS 1.1 09/24/2020 0337   MONOABS 0.7 09/24/2020 0337   EOSABS 0.3 09/24/2020 0337   BASOSABS 0.0 09/24/2020 0337   CMP Latest Ref Rng & Units 09/24/2020 09/23/2020 09/23/2020  Glucose 70 - 99 mg/dL 110(H) 103(H) 116(H)  BUN 8 - 23 mg/dL 16 14 15   Creatinine 0.61 - 1.24 mg/dL 0.97 0.92 0.94  Sodium 135 - 145 mmol/L 134(L) 133(L) 133(L)  Potassium 3.5 - 5.1 mmol/L 3.3(L) 2.8(L) 2.9(L)  Chloride 98 - 111 mmol/L 89(L) 88(L) 91(L)  CO2 22 - 32 mmol/L 36(H) 34(H) 32  Calcium 8.9 - 10.3 mg/dL 9.4 9.1 8.9  Total Protein 6.5 - 8.1 g/dL 7.1 - -  Total Bilirubin 0.3 - 1.2 mg/dL 0.8 - -  Alkaline Phos 38 - 126 U/L 71 - -  AST 15 - 41 U/L 36 - -  ALT 0 - 44 U/L 23 - -     Radiology Studies: No results found.   Scheduled Meds:  acetaZOLAMIDE  250 mg Oral BID   apixaban  5 mg Oral BID   buprenorphine-naloxone  1 tablet Sublingual BID   furosemide  80 mg Intravenous BID   gabapentin  800 mg Oral TID   metoprolol succinate  25 mg Oral BID   pantoprazole  40 mg Oral BID   potassium chloride SA  40 mEq Oral TID   pravastatin  20 mg Oral QHS   sertraline  12.5 mg Oral Daily   sodium chloride flush  3 mL Intravenous Q12H   spironolactone  50 mg Oral Daily   tamsulosin  0.4 mg Oral Daily   zolpidem  10 mg Oral QHS   Continuous Infusions:  sodium chloride       LOS: 1 day   Time spent: 55  Nita Sells, MD [facility] To contact the attending provider between 7A-7P or the covering provider during after hours 7P-7A, please log into the web site [email or web address] and access using universal [place] password for that web site. If you do not have the password, please call the hospital operator.  09/24/2020, 3:28 PM

## 2020-09-25 LAB — CBC WITH DIFFERENTIAL/PLATELET
Abs Immature Granulocytes: 0.04 10*3/uL (ref 0.00–0.07)
Basophils Absolute: 0 10*3/uL (ref 0.0–0.1)
Basophils Relative: 0 %
Eosinophils Absolute: 0.3 10*3/uL (ref 0.0–0.5)
Eosinophils Relative: 3 %
HCT: 38.6 % — ABNORMAL LOW (ref 39.0–52.0)
Hemoglobin: 11.6 g/dL — ABNORMAL LOW (ref 13.0–17.0)
Immature Granulocytes: 1 %
Lymphocytes Relative: 19 %
Lymphs Abs: 1.4 10*3/uL (ref 0.7–4.0)
MCH: 24 pg — ABNORMAL LOW (ref 26.0–34.0)
MCHC: 30.1 g/dL (ref 30.0–36.0)
MCV: 79.9 fL — ABNORMAL LOW (ref 80.0–100.0)
Monocytes Absolute: 0.7 10*3/uL (ref 0.1–1.0)
Monocytes Relative: 9 %
Neutro Abs: 5 10*3/uL (ref 1.7–7.7)
Neutrophils Relative %: 68 %
Platelets: 164 10*3/uL (ref 150–400)
RBC: 4.83 MIL/uL (ref 4.22–5.81)
RDW: 16.5 % — ABNORMAL HIGH (ref 11.5–15.5)
WBC: 7.4 10*3/uL (ref 4.0–10.5)
nRBC: 0 % (ref 0.0–0.2)

## 2020-09-25 LAB — BASIC METABOLIC PANEL
Anion gap: 9 (ref 5–15)
BUN: 18 mg/dL (ref 8–23)
CO2: 33 mmol/L — ABNORMAL HIGH (ref 22–32)
Calcium: 9.6 mg/dL (ref 8.9–10.3)
Chloride: 91 mmol/L — ABNORMAL LOW (ref 98–111)
Creatinine, Ser: 0.98 mg/dL (ref 0.61–1.24)
GFR, Estimated: >60 mL/min (ref >60)
Glucose, Bld: 120 mg/dL — ABNORMAL HIGH (ref 70–99)
Potassium: 3.3 mmol/L — ABNORMAL LOW (ref 3.5–5.1)
Sodium: 133 mmol/L — ABNORMAL LOW (ref 135–145)

## 2020-09-25 LAB — MAGNESIUM: Magnesium: 2.6 mg/dL — ABNORMAL HIGH (ref 1.7–2.4)

## 2020-09-25 MED ORDER — POTASSIUM CHLORIDE CRYS ER 20 MEQ PO TBCR
60.0000 meq | EXTENDED_RELEASE_TABLET | Freq: Three times a day (TID) | ORAL | Status: DC
  Administered 2020-09-25 – 2020-09-26 (×4): 60 meq via ORAL
  Filled 2020-09-25 (×4): qty 3

## 2020-09-25 MED ORDER — TORSEMIDE 100 MG PO TABS
100.0000 mg | ORAL_TABLET | Freq: Two times a day (BID) | ORAL | Status: DC
  Administered 2020-09-25 – 2020-09-26 (×3): 100 mg via ORAL
  Filled 2020-09-25 (×3): qty 1

## 2020-09-25 NOTE — Plan of Care (Signed)
  Problem: Education: Goal: Knowledge of General Education information will improve Description: Including pain rating scale, medication(s)/side effects and non-pharmacologic comfort measures Outcome: Progressing   Problem: Health Behavior/Discharge Planning: Goal: Ability to manage health-related needs will improve Outcome: Progressing   Problem: Clinical Measurements: Goal: Ability to maintain clinical measurements within normal limits will improve Outcome: Progressing   Problem: Clinical Measurements: Goal: Will remain free from infection Outcome: Progressing

## 2020-09-25 NOTE — Progress Notes (Signed)
CARDIAC REHAB PHASE I   PRE:  Rate/Rhythm: 66 SR    BP: sitting 108/64    SaO2: 95 RA  MODE:  Ambulation: 160 ft   POST:  Rate/Rhythm: 82 SR    BP: sitting 123/70     SaO2: 96 RA  Ambulated with rollator, slow and steady. Encouraged standing tall, which pt struggles with due to back pain. Sat x1 for rest. Return to EOB. Likes tall bariatric rollator. Discussed HF booklet. Pt know his low sodium diet.  [place], ACSM 09/25/2020 2:56 PM

## 2020-09-25 NOTE — Progress Notes (Signed)
[facility] PROGRESS NOTE    Dusty Raczkowski  RAQ:[id number] DOB: 1955/08/08 DOA: 09/20/2020 PCP: Enid Skeens., MD      Brief Narrative:  Mr. Counsell is a 65 y.o. M with obesity, history of DVT/PE 2021 on Eliquis, D CHF, OSA intolerant of CPAP, PAF, [name] with compensated cirrhosis, and history of GI bleed who presented with feeling sick, short of breath, and coughing with leg swelling.     Assessment & Plan:  Acute on chronic chronic diastolic CHF - Continue diuretics per cardiology - Continue metoprolol    Paroxysmal atrial fibrillation Acquired thrombophilia - Continue apixaban - Continue metoprolol  [name] cirrhosis -Continue diuretics  Hypokalemia Supplement potassium  Probable BPH -Continue Flomax  Chronic pain -Continue buprenorphine, sertraline  Sleep apnea CPAP noncompliant  Hyponatremia Mild, asymptomatic  Anemia of chronic disease CHF, hemoglobin stable, no clinical bleeding         Disposition: Status is: Inpatient  Remains inpatient appropriate because:Inpatient level of care appropriate due to severity of illness  Dispo: The patient is from: Home              Anticipated d/c is to: Home              Patient currently is not medically stable to d/c.   Difficult to place patient No       Level of care: Telemetry Cardiac       MDM: The below labs and imaging reports were reviewed and summarized above.  Medication management as above.  Blood thinner managed    DVT prophylaxis:  apixaban (ELIQUIS) tablet 5 mg  Code Status: Full code Family Communication:          Subjective: Patient still having some fatigue, feeling short of breath.  No further dizziness with standing.  Leg swelling is improved.  Objective: Vitals:   09/24/20 1939 09/25/20 0443 09/25/20 0831 09/25/20 1209  BP: 129/70 113/69 120/66 108/70  Pulse: 75 (!) 107 68 65  Resp: 16 18 18 18   Temp: 98 F (36.7 C) 97.7 F (36.5  C) 97.9 F (36.6 C) 98 F (36.7 C)  TempSrc:  Oral Oral Oral  SpO2: 98% 100%  98%  Weight:  (!) 168.3 kg    Height:        Intake/Output Summary (Last 24 hours) at 09/25/2020 1711 Last data filed at 09/25/2020 1526 Gross per 24 hour  Intake 1200 ml  Output 3145 ml  Net -1945 ml   Filed Weights   09/23/20 0219 09/24/20 0354 09/25/20 0443  Weight: (!) 169.4 kg (!) 168.8 kg (!) 168.3 kg    Examination: General appearance: obses adult male, alert and in no acute distress.  Sitting on the edge of the bed HEENT: Anicteric, conjunctiva pink, lids and lashes normal. No nasal deformity, discharge, epistaxis.  Lips moist.   Skin: Warm and dry.  no jaundice.  No suspicious rashes or lesions. Cardiac: RRR, nl S1-S2, no murmurs appreciated.  Capillary refill is brisk.  JVp not visible.  Pitting LE edema underneath his Unna boots.  Radial  pulses 2+ and symmetric. Respiratory: Normal respiratory rate and rhythm.  CTAB without rales or wheezes. Abdomen: Abdomen soft.  no TTP or guarding. No ascites, distension, hepatosplenomegaly.   MSK: No deformities or effusions. Neuro: Awake and alert.  EOMI, moves all extremities. Speech fluent.    Psych: Sensorium intact and responding to questions, attention normal. Affect normal.  Judgment and insight appear normal.    Data Reviewed:  I have personally reviewed following labs and imaging studies:  CBC: Recent Labs  Lab 09/20/20 1604 09/24/20 0337 09/25/20 0334  WBC 7.6 6.8 7.4  NEUTROABS  --  4.6 5.0  HGB 11.3* 11.1* 11.6*  HCT 37.5* 36.2* 38.6*  MCV 79.4* 79.2* 79.9*  PLT 160 144* 856   Basic Metabolic Panel: Recent Labs  Lab 09/21/20 0837 09/22/20 0244 09/23/20 0628 09/23/20 1620 09/24/20 0337 09/24/20 1426 09/25/20 0334  NA  --  134* 133* 133* 134* 130* 133*  K  --  3.4* 2.9* 2.8* 3.3* 3.4* 3.3*  CL  --  93* 91* 88* 89* 89* 91*  CO2  --  33* 32 34* 36* 30 33*  GLUCOSE  --  124* 116* 103* 110* 111* 120*  BUN  --  14 15 14 16  18 18   CREATININE  --  0.93 0.94 0.92 0.97 1.06 0.98  CALCIUM  --  9.3 8.9 9.1 9.4 9.7 9.6  MG 2.2 2.3 2.3  --  2.3  --  2.6*   GFR: Estimated Creatinine Clearance: 137.5 mL/min (by C-G formula based on SCr of 0.98 mg/dL). Liver Function Tests: Recent Labs  Lab 09/20/20 1604 09/24/20 0337  AST 55* 36  ALT 22 23  ALKPHOS 74 71  BILITOT 1.4* 0.8  PROT 6.9 7.1  ALBUMIN 3.4* 3.3*   No results for input(s): LIPASE, AMYLASE in the last 168 hours. No results for input(s): AMMONIA in the last 168 hours. Coagulation Profile: Recent Labs  Lab 09/20/20 1604  INR 1.3*   Cardiac Enzymes: No results for input(s): CKTOTAL, CKMB, CKMBINDEX, TROPONINI in the last 168 hours. BNP (last 3 results) No results for input(s): PROBNP in the last 8760 hours. HbA1C: No results for input(s): HGBA1C in the last 72 hours. CBG: No results for input(s): GLUCAP in the last 168 hours. Lipid Profile: No results for input(s): CHOL, HDL, LDLCALC, TRIG, CHOLHDL, LDLDIRECT in the last 72 hours. Thyroid Function Tests: No results for input(s): TSH, T4TOTAL, FREET4, T3FREE, THYROIDAB in the last 72 hours. Anemia Panel: No results for input(s): VITAMINB12, FOLATE, FERRITIN, TIBC, IRON, RETICCTPCT in the last 72 hours. Urine analysis:    Component Value Date/Time   COLORURINE YELLOW 08/13/2020 1953   APPEARANCEUR CLEAR 08/13/2020 1953   LABSPEC 1.011 08/13/2020 1953   PHURINE 6.0 08/13/2020 1953   GLUCOSEU NEGATIVE 08/13/2020 1953   HGBUR NEGATIVE 08/13/2020 1953   BILIRUBINUR NEGATIVE 08/13/2020 1953   KETONESUR NEGATIVE 08/13/2020 1953   PROTEINUR NEGATIVE 08/13/2020 1953   NITRITE NEGATIVE 08/13/2020 1953   LEUKOCYTESUR NEGATIVE 08/13/2020 1953   Sepsis Labs: @LABRCNTIP (procalcitonin:4,lacticacidven:4)  ) Recent Results (from the past 240 hour(s))  Resp Panel by RT-PCR (Flu A&B, Covid) Nasopharyngeal Swab     Status: None   Collection Time: 09/20/20  8:56 PM   Specimen: Nasopharyngeal Swab;  Nasopharyngeal(NP) swabs in vial transport medium  Result Value Ref Range Status   SARS Coronavirus 2 by RT PCR NEGATIVE NEGATIVE Final    Comment: (NOTE) SARS-CoV-2 target nucleic acids are NOT DETECTED.  The SARS-CoV-2 RNA is generally detectable in upper respiratory specimens during the acute phase of infection. The lowest concentration of SARS-CoV-2 viral copies this assay can detect is 138 copies/mL. A negative result does not preclude SARS-Cov-2 infection and should not be used as the sole basis for treatment or other patient management decisions. A negative result may occur with  improper specimen collection/handling, submission of specimen other than nasopharyngeal swab, presence of viral mutation(s) within the areas  targeted by this assay, and inadequate number of viral copies(<138 copies/mL). A negative result must be combined with clinical observations, patient history, and epidemiological information. The expected result is Negative.  Fact Sheet for Patients:  EntrepreneurPulse.com.au  Fact Sheet for Healthcare Providers:  IncredibleEmployment.be  This test is no t yet approved or cleared by the [place] FDA and  has been authorized for detection and/or diagnosis of SARS-CoV-2 by FDA under an Emergency Use Authorization (EUA). This EUA will remain  in effect (meaning this test can be used) for the duration of the COVID-19 declaration under Section 564(b)(1) of the Act, 21 U.S.C.section 360bbb-3(b)(1), unless the authorization is terminated  or revoked sooner.       Influenza A by PCR NEGATIVE NEGATIVE Final   Influenza B by PCR NEGATIVE NEGATIVE Final    Comment: (NOTE) The Xpert Xpress SARS-CoV-2/FLU/RSV plus assay is intended as an aid in the diagnosis of influenza from Nasopharyngeal swab specimens and should not be used as a sole basis for treatment. Nasal washings and aspirates are unacceptable for Xpert Xpress  SARS-CoV-2/FLU/RSV testing.  Fact Sheet for Patients: EntrepreneurPulse.com.au  Fact Sheet for Healthcare Providers: IncredibleEmployment.be  This test is not yet approved or cleared by the [place] FDA and has been authorized for detection and/or diagnosis of SARS-CoV-2 by FDA under an Emergency Use Authorization (EUA). This EUA will remain in effect (meaning this test can be used) for the duration of the COVID-19 declaration under Section 564(b)(1) of the Act, 21 U.S.C. section 360bbb-3(b)(1), unless the authorization is terminated or revoked.  Performed at [facility], [place] [address], [place],  [address]          Radiology Studies: No results found.      Scheduled Meds:  apixaban  5 mg Oral BID   buprenorphine-naloxone  1 tablet Sublingual BID   gabapentin  800 mg Oral TID   metoprolol succinate  25 mg Oral BID   pantoprazole  40 mg Oral BID   potassium chloride SA  60 mEq Oral TID   pravastatin  20 mg Oral QHS   sertraline  12.5 mg Oral Daily   sodium chloride flush  3 mL Intravenous Q12H   spironolactone  50 mg Oral Daily   tamsulosin  0.4 mg Oral Daily   torsemide  100 mg Oral BID   zolpidem  10 mg Oral QHS   Continuous Infusions:  sodium chloride       LOS: 2 days    Time spent: 25 minutes    Edwin Dada, MD [facility] 09/25/2020, 5:11 PM     Please page though [place] or Epic secure chat:  For [employer], Adult nurse

## 2020-09-25 NOTE — Progress Notes (Addendum)
Patient ID: Charles Gray, male   DOB: 12/01/1955, 65 y.o.   MRN: [id number]     Advanced Heart Failure Rounding Note  PCP-Cardiologist: Berniece Salines, DO   Subjective:   Weight 563>149>702  6/14 Diuresed with IV lasix + metolazone + diamox. In the afternoon he was orthostatic so evening diuretics held.   Complaining of urinary incontinence and fatigue.   Objective:   Weight Range: (!) 168.3 kg Body mass index is 38.79 kg/m.   Vital Signs:   Temp:  [97.7 F (36.5 C)-98 F (36.7 C)] 97.7 F (36.5 C) (06/15 0443) Pulse Rate:  [63-107] 107 (06/15 0443) Resp:  [16-20] 18 (06/15 0443) BP: (113-129)/(69-73) 113/69 (06/15 0443) SpO2:  [98 %-100 %] 100 % (06/15 0443) Weight:  [168.3 kg] 168.3 kg (06/15 0443) Last BM Date: 09/24/20  Weight change: Filed Weights   09/23/20 0219 09/24/20 0354 09/25/20 0443  Weight: (!) 169.4 kg (!) 168.8 kg (!) 168.3 kg    Intake/Output:   Intake/Output Summary (Last 24 hours) at 09/25/2020 0730 Last data filed at 09/25/2020 0335 Gross per 24 hour  Intake 2020 ml  Output 2670 ml  Net -650 ml      Physical Exam   General:  Appears tired. Standing in the room. No resp difficulty HEENT: normal Neck: supple. JVP difficult to assess.  Carotids 2+ bilat; no bruits. No lymphadenopathy or thryomegaly appreciated. Cor: PMI nondisplaced. Regular rate & rhythm. No rubs, gallops or murmurs. Lungs: clear Abdomen: soft, nontender, nondistended. No hepatosplenomegaly. No bruits or masses. Good bowel sounds. Extremities: no cyanosis, clubbing, rash, edema Neuro: alert & orientedx3, cranial nerves grossly intact. moves all 4 extremities w/o difficulty. Affect pleasant   Telemetry  NSR 60-80s.   Labs    CBC Recent Labs    09/24/20 0337 09/25/20 0334  WBC 6.8 7.4  NEUTROABS 4.6 5.0  HGB 11.1* 11.6*  HCT 36.2* 38.6*  MCV 79.2* 79.9*  PLT 144* 637   Basic Metabolic Panel Recent Labs    09/24/20 0337 09/24/20 1426 09/25/20 0334  NA 134*  130* 133*  K 3.3* 3.4* 3.3*  CL 89* 89* 91*  CO2 36* 30 33*  GLUCOSE 110* 111* 120*  BUN 16 18 18   CREATININE 0.97 1.06 0.98  CALCIUM 9.4 9.7 9.6  MG 2.3  --  2.6*   Liver Function Tests Recent Labs    09/24/20 0337  AST 36  ALT 23  ALKPHOS 71  BILITOT 0.8  PROT 7.1  ALBUMIN 3.3*   No results for input(s): LIPASE, AMYLASE in the last 72 hours. Cardiac Enzymes No results for input(s): CKTOTAL, CKMB, CKMBINDEX, TROPONINI in the last 72 hours.  BNP: BNP (last 3 results) Recent Labs    09/06/20 2151 09/10/20 1210 09/20/20 1604  BNP 83.6 20.7 21.5    ProBNP (last 3 results) No results for input(s): PROBNP in the last 8760 hours.   D-Dimer No results for input(s): DDIMER in the last 72 hours. Hemoglobin A1C No results for input(s): HGBA1C in the last 72 hours. Fasting Lipid Panel No results for input(s): CHOL, HDL, LDLCALC, TRIG, CHOLHDL, LDLDIRECT in the last 72 hours. Thyroid Function Tests No results for input(s): TSH, T4TOTAL, T3FREE, THYROIDAB in the last 72 hours.  Invalid input(s): FREET3  Other results:   Imaging    No results found.   Medications:     Scheduled Medications:  apixaban  5 mg Oral BID   buprenorphine-naloxone  1 tablet Sublingual BID   gabapentin  800 mg  Oral TID   metoprolol succinate  25 mg Oral BID   pantoprazole  40 mg Oral BID   potassium chloride SA  40 mEq Oral TID   pravastatin  20 mg Oral QHS   sertraline  12.5 mg Oral Daily   sodium chloride flush  3 mL Intravenous Q12H   spironolactone  50 mg Oral Daily   tamsulosin  0.4 mg Oral Daily   zolpidem  10 mg Oral QHS    Infusions:  sodium chloride      PRN Medications: sodium chloride, acetaminophen, albuterol, ondansetron (ZOFRAN) IV, polyethylene glycol, sodium chloride flush   Assessment/Plan   1. Acute on chronic diastolic CHF: Suspect prominent RV dysfunction.  Echo in 12/21 with EF 67-89%, grade 2 diastolic dysfunction, RV poorly visualized.  Multiple  CHF admissions.   - Yesterday orthostatic. Diuretics held in the evening. Suspect this is as good as we can get him.  - Start torsemide 100 mg twice a day and will need metolazone twice a week. Monday and Friday.  - Increase potassium to 60 meq three time a day.  -  Ideally, would have Cardiomems but he declines.   - He did not tolerate jardiance due to GI upset. - Continue spironolactone 50 mg daily, eplerenone at discharge.  Renal function stable.  2. H/o PE/DVT: Suspect due to sedentary lifestyle.  Occurred in 2/21.  V/Q scan in 6/21 did not show evidence for chronic PE.   - Continue apixaban. 3. OSA: Cannot tolerate CPAP. 4. Anxiety/panic: - Continue sertraline 25 mg daily. 5. CAD: Cath 1/22 with no significant CAD.  6. Hypertension: Stable.  - Continue Toprol XL 25 mg twice a day.   7. Atrial fibrillation: Paroxysmal. Obtain EKG. Irregular today.   -  Continue apixaban.   Frequent hospitalizations despite HF Paramedicine and close follow up in the HF clinic. Refuses Cardiomems.  Cardiac Rehab appreciated.   Length of Stay: 2  [name], NP  09/25/2020, 7:30 AM  Advanced Heart Failure Team Pager [phone] (M-F; 7a - 5p)  Please contact [facility] for night-coverage after hours (5p -7a ) and weekends on [email or web address]  Patient seen with NP, agree with the above note.   Weight down again.  Orthostatic last night, better today.  Creatinine stable.  At this point, would restart him on po diuretics, use torsemide 100 mg bid and metolazone twice weekly Monday and Friday.  Recommended Cardiomems but he refuses.    ECG with NSR.    Should be able to go home soon (today or tomorrow).   Loralie Champagne 09/25/2020 1:32 PM

## 2020-09-25 NOTE — Progress Notes (Signed)
Physical Therapy Treatment Patient Details Name: Charles Gray MRN: [id number] DOB: Aug 22, 1955 Today's Date: 09/25/2020    History of Present Illness Patient is a 65 y/o male who presents 09/20/20 with SOB, LE edema and chest heaviness. Admitted with respiratory failure secondary to acute on chronic CHF.  PMH includes obesity, DVT/PE, cirrhosis, HTN, A-fib, chronic pain, CAD, insomnia, CHF.   PT Comments    Pt progressing with mobility. Today's session focused on multiple bouts of gait training with RW and bariatric rollator. Pt with preference for rollator for added stability and energy conservation, although pt aware one will not be covered by insurance. Pt demonstrates improving activity tolerance, although remains limited by fatigue and SOB. Will continue to follow acutely to address established goals.  SpO2 98% on RA, HR 80   Follow Up Recommendations  Home health PT     Equipment Recommendations  Bariatric-sized rollator   Recommendations for Other Services       Precautions / Restrictions Precautions Precautions: Fall Restrictions Weight Bearing Restrictions: No    Mobility  Bed Mobility               General bed mobility comments: Received sitting EOB    Transfers Overall transfer level: Modified independent Equipment used: Rolling walker (2 wheeled);4-wheeled walker Transfers: Sit to/from Stand Sit to Stand: Modified independent (Device/Increase time);From elevated surface         General transfer comment: Multiple sit<>stands from EOB and rollator seat; pt initiating bed height to stand, subsequent trials performed from low bed height, rollator seat and low chair in hallway, pt mod indep to stand, reliant on UE support to push/pull into standing  Ambulation/Gait Ambulation/Gait assistance: Supervision Gait Distance (Feet): 106 Feet (+ 106' + 48' + 52' + 52') Assistive device: Rolling walker (2 wheeled);4-wheeled walker Gait Pattern/deviations:  Step-through pattern;Decreased stride length;Trunk flexed Gait velocity: Decreased   General Gait Details: Slow, steady, fatigued gait initially with RW, pt requiring prolonged seated rest after walking 106' secondary to SOB and fatigue; pt ambulated additional 106' back to room with RW, then transitioned to gait training with rollator; pt taking various seated rest breaks to practice technique with rollator seat and locking/unlcoking brakes. Pt also trialling a few steps forward/backwards while holding folded up rollator as pt wanting to see if it was ok weight to carry up/down his apt stairs   Stairs             Wheelchair Mobility    Modified Rankin (Stroke Patients Only)       Balance Overall balance assessment: Needs assistance Sitting-balance support: Feet supported;No upper extremity supported Sitting balance-Leahy Scale: Good     Standing balance support: During functional activity;No upper extremity supported Standing balance-Leahy Scale: Fair Standing balance comment: Can static stand, take steps and void standing at toilet without UE support; stability improved with UE support                            Cognition Arousal/Alertness: Awake/alert Behavior During Therapy: WFL for tasks assessed/performed;Flat affect Overall Cognitive Status: Within Functional Limits for tasks assessed                                 General Comments: WFL for simple tasks, not formally assessed; seems fatigued      Exercises      General Comments General comments (skin integrity, edema, etc.): SpO2  98% on RA, HR 80. Pt aware insurance will not cover rollator, but interested in buying out of pocket and wants to continue gait training with one. Increased time discussing activity recommendations (i.e. short bouts of activity more frequently with rest breaks); pt focused on "no one to walk outside with me, I'm not supposed to walk alone (at home)" but educ on  walking laps in apt is a good option as well      Pertinent Vitals/Pain Pain Assessment: Faces Faces Pain Scale: Hurts a little bit Pain Location: BUEs Pain Descriptors / Indicators: Cramping Pain Intervention(s): Monitored during session    Home Living                      Prior Function            PT Goals (current goals can now be found in the care plan section) Progress towards PT goals: Progressing toward goals    Frequency    Min 3X/week      PT Plan Frequency needs to be updated    Co-evaluation              AM-PAC PT "6 Clicks" Mobility   Outcome Measure  Help needed turning from your back to your side while in a flat bed without using bedrails?: None Help needed moving from lying on your back to sitting on the side of a flat bed without using bedrails?: None Help needed moving to and from a bed to a chair (including a wheelchair)?: A Little Help needed standing up from a chair using your arms (e.g., wheelchair or bedside chair)?: None Help needed to walk in hospital room?: A Little Help needed climbing 3-5 steps with a railing? : A Little 6 Click Score: 21    End of Session Equipment Utilized During Treatment: Gait belt Activity Tolerance: Patient tolerated treatment well Patient left: in bed;with call bell/phone within reach;with bed alarm set (seated EOB) Nurse Communication: Mobility status PT Visit Diagnosis: Difficulty in walking, not elsewhere classified (R26.2)     Time: 2761-4709 PT Time Calculation (min) (ACUTE ONLY): 42 min  Charges:  $Gait Training: 23-37 mins $Therapeutic Activity: 8-22 mins                    Mabeline Caras, PT, DPT Acute Rehabilitation Services  Pager [phone] Office [place] 09/25/2020, 12:09 PM

## 2020-09-26 ENCOUNTER — Other Ambulatory Visit (HOSPITAL_COMMUNITY): Payer: Self-pay

## 2020-09-26 LAB — BASIC METABOLIC PANEL
Anion gap: 10 (ref 5–15)
BUN: 19 mg/dL (ref 8–23)
CO2: 34 mmol/L — ABNORMAL HIGH (ref 22–32)
Calcium: 9 mg/dL (ref 8.9–10.3)
Chloride: 90 mmol/L — ABNORMAL LOW (ref 98–111)
Creatinine, Ser: 1.13 mg/dL (ref 0.61–1.24)
GFR, Estimated: >60 mL/min (ref >60)
Glucose, Bld: 112 mg/dL — ABNORMAL HIGH (ref 70–99)
Potassium: 3.3 mmol/L — ABNORMAL LOW (ref 3.5–5.1)
Sodium: 134 mmol/L — ABNORMAL LOW (ref 135–145)

## 2020-09-26 LAB — CBC WITH DIFFERENTIAL/PLATELET
Abs Immature Granulocytes: 0.02 10*3/uL (ref 0.00–0.07)
Basophils Absolute: 0 10*3/uL (ref 0.0–0.1)
Basophils Relative: 1 %
Eosinophils Absolute: 0.2 10*3/uL (ref 0.0–0.5)
Eosinophils Relative: 3 %
HCT: 37.8 % — ABNORMAL LOW (ref 39.0–52.0)
Hemoglobin: 11.5 g/dL — ABNORMAL LOW (ref 13.0–17.0)
Immature Granulocytes: 0 %
Lymphocytes Relative: 18 %
Lymphs Abs: 1.2 10*3/uL (ref 0.7–4.0)
MCH: 24.1 pg — ABNORMAL LOW (ref 26.0–34.0)
MCHC: 30.4 g/dL (ref 30.0–36.0)
MCV: 79.2 fL — ABNORMAL LOW (ref 80.0–100.0)
Monocytes Absolute: 0.7 10*3/uL (ref 0.1–1.0)
Monocytes Relative: 10 %
Neutro Abs: 4.5 10*3/uL (ref 1.7–7.7)
Neutrophils Relative %: 68 %
Platelets: 149 10*3/uL — ABNORMAL LOW (ref 150–400)
RBC: 4.77 MIL/uL (ref 4.22–5.81)
RDW: 16.3 % — ABNORMAL HIGH (ref 11.5–15.5)
WBC: 6.5 10*3/uL (ref 4.0–10.5)
nRBC: 0 % (ref 0.0–0.2)

## 2020-09-26 LAB — MAGNESIUM: Magnesium: 2.4 mg/dL (ref 1.7–2.4)

## 2020-09-26 MED ORDER — POTASSIUM CHLORIDE CRYS ER 20 MEQ PO TBCR
40.0000 meq | EXTENDED_RELEASE_TABLET | Freq: Once | ORAL | Status: AC
  Administered 2020-09-26: 40 meq via ORAL
  Filled 2020-09-26: qty 2

## 2020-09-26 MED ORDER — POTASSIUM CHLORIDE CRYS ER 20 MEQ PO TBCR
60.0000 meq | EXTENDED_RELEASE_TABLET | Freq: Two times a day (BID) | ORAL | 11 refills | Status: DC
  Filled 2020-09-26: qty 120, 20d supply, fill #0

## 2020-09-26 MED ORDER — TORSEMIDE 100 MG PO TABS
100.0000 mg | ORAL_TABLET | Freq: Two times a day (BID) | ORAL | 3 refills | Status: DC
  Filled 2020-09-26: qty 60, 30d supply, fill #0

## 2020-09-26 MED ORDER — METOLAZONE 2.5 MG PO TABS
2.5000 mg | ORAL_TABLET | ORAL | 3 refills | Status: DC
  Filled 2020-09-26: qty 10, 30d supply, fill #0

## 2020-09-26 MED ORDER — TAMSULOSIN HCL 0.4 MG PO CAPS
0.4000 mg | ORAL_CAPSULE | Freq: Every day | ORAL | 3 refills | Status: DC
  Filled 2020-09-26: qty 30, 30d supply, fill #0

## 2020-09-26 NOTE — Progress Notes (Signed)
D/C instructions given and reviewed. Tele and IV removed, tolerated well. Will notify [facility] transport to transport home.

## 2020-09-26 NOTE — TOC Transition Note (Signed)
Transition of Care [facility]) - CM/SW Discharge Note   Patient Details  Name: Charles Gray MRN: [id number] Date of Birth: Dec 21, 1955  Transition of Care [facility]) CM/SW Contact:  Zenon Mayo, RN Phone Number: 09/26/2020, 9:28 AM   Clinical Narrative:    NCM spoke with patient, offered choice, he states he has had [facility] in the past and would like to continue with them for HHPT.  NCM made referral to [facility] for [place]. He is able to take referral. Soc will begin 24 to 48 hrs post dc.  Patient states he has scale and bp cuff at home. He weighs himself daily and checks his bp.  He states he tries to consume a low sodium diet as well.  He states he is working on getting his transportation to go home.   Final next level of care: [place] Barriers to Discharge: No Barriers Identified   Patient Goals and CMS Choice Patient states their goals for this hospitalization and ongoing recovery are:: return home CMS Medicare.gov Compare Post Acute Care list provided to:: Patient Choice offered to / list presented to : Patient  Discharge Placement                       Discharge Plan and Services In-house Referral: Clinical Social Work                DME Agency: NA       HH Arranged: PT HH Agency: [place] Date [facility] Agency Contacted: 09/26/20 Time [place]: [phone] Representative spoke with at [place]: [place] Determinants of Health (SDOH) Interventions Food Insecurity Interventions: Intervention Not Indicated Financial Strain Interventions: Intervention Not Indicated Housing Interventions: Intervention Not Indicated Transportation Interventions: [facility]   Readmission Risk Interventions Readmission Risk Prevention Plan 09/26/2020 06/12/2020 03/27/2020  Transportation Screening Complete Complete Complete  HRI or [place] Work Consult for [place] Planning/Counseling - - -   Medication Review Press photographer) Complete Complete Complete  PCP or Specialist appointment within 3-5 days of discharge Complete - Complete  HRI or [place] Complete Complete Complete  SW Recovery Care/Counseling Consult Complete Complete Complete  Palliative Care Screening Not Applicable Not Applicable Not [place] Not Applicable Not Applicable Not Applicable

## 2020-09-26 NOTE — Evaluation (Signed)
Occupational Therapy Evaluation Patient Details Name: Charles Gray MRN: [id number] DOB: 10-Jun-1955 Today's Date: 09/26/2020    History of Present Illness Patient is a 65 y/o male who presents 09/20/20 with SOB, LE edema and chest heaviness. Admitted with respiratory failure secondary to acute on chronic CHF.  PMH includes obesity, DVT/PE, cirrhosis, HTN, A-fib, chronic pain, CAD, insomnia, CHF.   Clinical Impression   Pt was living alone with an aide who comes 1.5 hours, 4 days a week and assists with showering and IADL primarily. Pt presents with generalized weakness, dizziness and nausea. He was negative for orthostatic hypotension this visit. Pt requires set up to min assist for ADL and supervision for ambulation with a rollator. Pt to discharge later today. Will defer further OT to [place].     Follow Up Recommendations  Home health OT    Equipment Recommendations  None recommended by OT    Recommendations for Other Services       Precautions / Restrictions Precautions Precautions: Fall Precaution Comments: dizziness, but not orthostatic      Mobility Bed Mobility Overal bed mobility: Modified Independent             General bed mobility comments: slow, pt typically sleeps in a recliner    Transfers Overall transfer level: Needs assistance Equipment used: 4-wheeled walker Transfers: Sit to/from Stand Sit to Stand: Supervision         General transfer comment: increased time, supervision for safety with c/o dizziness    Balance Overall balance assessment: Needs assistance   Sitting balance-Leahy Scale: Good     Standing balance support: During functional activity;No upper extremity supported Standing balance-Leahy Scale: Fair Standing balance comment: at sink and during BP in standing                           ADL either performed or assessed with clinical judgement   ADL Overall ADL's : Needs assistance/impaired Eating/Feeding:  Independent   Grooming: Wash/dry hands;Standing;Supervision/safety   Upper Body Bathing: Sitting;Minimal assistance   Lower Body Bathing: Sitting/lateral leans;Minimal assistance   Upper Body Dressing : Set up;Sitting   Lower Body Dressing: Supervision/safety;Sit to/from stand Lower Body Dressing Details (indicate cue type and reason): increased time Toilet Transfer: Supervision/safety;RW;Comfort height toilet   Toileting- Clothing Manipulation and Hygiene: Supervision/safety;Sit to/from stand       Functional mobility during ADLs: Supervision/safety;Rolling walker General ADL Comments: pt with c/o of dizziness, orthostatic BPs taken and negative, see flow sheet     Vision Patient Visual Report: No change from baseline       Perception     Praxis      Pertinent Vitals/Pain Pain Assessment: Faces Faces Pain Scale: Hurts even more Pain Location: back Pain Descriptors / Indicators: Discomfort;Grimacing;Guarding Pain Intervention(s): Monitored during session;Repositioned     Hand Dominance Right   Extremity/Trunk Assessment Upper Extremity Assessment Upper Extremity Assessment: Overall WFL for tasks assessed   Lower Extremity Assessment Lower Extremity Assessment: Defer to PT evaluation   Cervical / Trunk Assessment Cervical / Trunk Assessment: Kyphotic (reports ruptured discs in his spine)   Communication Communication Communication: No difficulties   Cognition Arousal/Alertness: Awake/alert Behavior During Therapy: Flat affect Overall Cognitive Status: Within Functional Limits for tasks assessed                                 General Comments: per RN, pt does not  ask for assistance, pain meds, nausea meds--she must ask him if he is comfortable   General Comments       Exercises     Shoulder Instructions      Home Living Family/patient expects to be discharged to:: Private residence Living Arrangements: Alone Available Help at  Discharge: Personal care attendant Type of Home: Apartment Home Access: Stairs to enter Entrance Stairs-Number of Steps: 14 Entrance Stairs-Rails: Right;Left;Can reach both Home Layout: One level     Bathroom Shower/Tub: Teacher, early years/pre: Handicapped height     [place]: Tub bench;Walker - 2 wheels;Hand held shower head;Hospital bed          Prior Functioning/Environment Level of Independence: Needs assistance  Gait / Transfers Assistance Needed: using walker for gait, sleeps in recliner ADL's / [place] Needed: aide assists with getting in/out of tub/bathing and dressing assist 4x/wk, assist for homemaking, groceries delivered   Comments: Has HH aide 1.5 hrs 4 days/week        OT Problem List: Impaired balance (sitting and/or standing)      OT Treatment/Interventions:      OT Goals(Current goals can be found in the care plan section) Acute Rehab OT Goals Patient Stated Goal: to return home with his cat OT Goal Formulation: With patient  OT Frequency:     Barriers to D/C:            Co-evaluation              AM-PAC OT "6 Clicks" Daily Activity     Outcome Measure Help from another person eating meals?: None Help from another person taking care of personal grooming?: A Little Help from another person toileting, which includes using toliet, bedpan, or urinal?: A Little Help from another person bathing (including washing, rinsing, drying)?: A Little Help from another person to put on and taking off regular upper body clothing?: None Help from another person to put on and taking off regular lower body clothing?: A Little 6 Click Score: 20   End of Session Equipment Utilized During Treatment: Rolling walker Nurse Communication: Other (comment) (aware of dizziness, BPs)  Activity Tolerance: Patient tolerated treatment well Patient left: in bed;with call bell/phone within reach (EOB)  OT Visit Diagnosis: Muscle weakness  (generalized) (M62.81);Dizziness and giddiness (R42)                Time: 7846-9629 OT Time Calculation (min): 26 min Charges:  OT General Charges $OT Visit: 1 Visit OT Evaluation $OT Eval Moderate Complexity: 1 Mod OT Treatments $Self Care/Home Management : 8-22 mins  Nestor Lewandowsky, OTR/L Acute Rehabilitation Services Pager: [phone] Office: [phone]   Malka So 09/26/2020, 11:33 AM

## 2020-09-26 NOTE — Care Management Important Message (Signed)
Important Message  Patient Details  Name: Charles Gray MRN: [id number] Date of Birth: Jun 27, 1955   Medicare Important Message Given:  Yes     Casimiro Lienhard [address] 09/26/2020, 3:49 PM

## 2020-09-26 NOTE — Progress Notes (Signed)
  Mobility Specialist Criteria Algorithm Info.  Mobility Team: [facility] elevated:Self regulated Activity: Ambulated in room; Ambulated to bathroom; Dangled on edge of bed Range of motion: Active; All extremities Level of assistance: Contact guard assist, steadying assist Assistive device: Four wheel walker Minutes sitting in chair:  Minutes stood: 2 minutes Minutes ambulated: 2 minutes Distance ambulated (ft): 15 ft Mobility response: Tolerated poorly; RN notified Bed Position: Semi-fowlers  Patient received dangling EOB, agreed to participate in mobility despite not feeling well. Pt stood and ambulated to bathroom at min G with Rollator. Ambulated to restroom where he then c/o of dizziness and that he didn't feel well overall. Upon further observation writer felt it was unsafe for pt to ambulate in hallway, discontinued mobility session. VSS. Returned to EOB, did not tolerate ambulation well secondary to SxS. Patient was left dangling EOB with all needs met, RN notified.   09/26/2020 10:38 AM

## 2020-09-26 NOTE — TOC Transition Note (Signed)
Transition of Care (TOC) - CM/SW Discharge Note Heart Failure   Patient Details  Name: Jonael Paradiso MRN: [id number] Date of Birth: [birth year]  [facility]) CM/SW Contact:  [place], [place] Phone Number: 09/26/2020, 10:25 AM   Clinical Narrative:    CSW spoke with the patient at bedside to bring them an appointment card for the [facility] outpatient clinic and encouraged them to follow up and to attend the appointment and bring their medications and if anything changes to please reach out so that CSW/HV clinic team can provide support. CSW provided Mr. Kraeger with medical supply information as he requested. Carlen reported that he isn't sure if his son will be picking him up at the hospital or not. CSW informed Mr. Cullinane that CSW can call transportation for him if needed and patient reported understanding.  CSW will sign off for now as social work intervention is no longer needed. Please consult [place] again if new needs arise.    Final next level of care: [place] Barriers to Discharge: No Barriers Identified   Patient Goals and CMS Choice Patient states their goals for this hospitalization and ongoing recovery are:: return home CMS Medicare.gov Compare Post Acute Care list provided to:: Patient Choice offered to / list presented to : Patient  Discharge Placement                       Discharge Plan and Services In-house Referral: Clinical Social Work                DME Agency: NA       HH Arranged: PT [place] Agency: [place] Date [facility] Agency Contacted: 09/26/20 Time [place]: [phone] Representative spoke with at [place]: [place] Determinants of Health ([place]) Interventions Food Insecurity Interventions: Intervention Not Indicated Financial Strain Interventions: Intervention Not Indicated Housing Interventions: Intervention Not Indicated Transportation Interventions: [facility]   Readmission Risk Interventions Readmission Risk Prevention Plan 09/26/2020 06/12/2020 03/27/2020  Transportation Screening Complete Complete Complete  HRI or [place] Work Consult for [place] Planning/Counseling - - -  Medication Review Press photographer) Complete Complete Complete  PCP or Specialist appointment within 3-5 days of discharge Complete - Complete  HRI or [place] Complete Complete Complete  SW Recovery Care/Counseling Consult Complete Complete Complete  Palliative Care Screening Not Applicable Not Applicable Not [place] Not Applicable Not Applicable Not Applicable     Anacleto Batterman, MSW, [place] Heart Failure Social Worker

## 2020-09-26 NOTE — Care Management Important Message (Signed)
Important Message  Patient Details  Name: Kaiyon Hynes MRN: [id number] Date of Birth: 1955/07/05   Medicare Important Message Given:  Yes     Adella Manolis [address] 09/26/2020, 3:52 PM

## 2020-09-26 NOTE — Plan of Care (Signed)

## 2020-09-26 NOTE — Progress Notes (Addendum)
Patient ID: Charles Gray, male   DOB: 1956-03-10, 65 y.o.   MRN: [id number]     Advanced Heart Failure Rounding Note  PCP-Cardiologist: Berniece Salines, DO   Subjective:   Weight 564>332>951  6/14 Diuresed with IV lasix + metolazone + diamox. In the afternoon he was orthostatic so evening diuretics held.  6/15 Started on torsemide and metolazone.   Complaining of ongoing fatigue.   Objective:   Weight Range: (!) 168.2 kg Body mass index is 38.77 kg/m.   Vital Signs:   Temp:  [98 F (36.7 C)-99 F (37.2 C)] 98 F (36.7 C) (06/16 0408) Pulse Rate:  [53-66] 65 (06/16 0736) Resp:  [18-19] 19 (06/16 0408) BP: (107-109)/(65-76) 109/65 (06/16 0736) SpO2:  [97 %-100 %] 97 % (06/16 0408) Weight:  [168.2 kg] 168.2 kg (06/16 0408) Last BM Date: 09/24/20  Weight change: Filed Weights   09/24/20 0354 09/25/20 0443 09/26/20 0408  Weight: (!) 168.8 kg (!) 168.3 kg (!) 168.2 kg    Intake/Output:   Intake/Output Summary (Last 24 hours) at 09/26/2020 0834 Last data filed at 09/26/2020 0412 Gross per 24 hour  Intake 1920 ml  Output 5200 ml  Net -3280 ml      Physical Exam   General:  Sitting on the side of the bed. No resp difficulty HEENT: normal Neck: supple. no JVD. Carotids 2+ bilat; no bruits. No lymphadenopathy or thryomegaly appreciated. Cor: PMI nondisplaced. Regular rate & rhythm. No rubs, gallops or murmurs. Lungs: clear Abdomen: soft, nontender, nondistended. No hepatosplenomegaly. No bruits or masses. Good bowel sounds. Extremities: no cyanosis, clubbing, rash, unna boots on lower extremities. Neuro: alert & orientedx3, cranial nerves grossly intact. moves all 4 extremities w/o difficulty. Affect pleasant   Telemetry  NSR 60s   Labs    CBC Recent Labs    09/25/20 0334 09/26/20 0347  WBC 7.4 6.5  NEUTROABS 5.0 4.5  HGB 11.6* 11.5*  HCT 38.6* 37.8*  MCV 79.9* 79.2*  PLT 164 884*   Basic Metabolic Panel Recent Labs    09/25/20 0334 09/26/20 0347  NA  133* 134*  K 3.3* 3.3*  CL 91* 90*  CO2 33* 34*  GLUCOSE 120* 112*  BUN 18 19  CREATININE 0.98 1.13  CALCIUM 9.6 9.0  MG 2.6* 2.4   Liver Function Tests Recent Labs    09/24/20 0337  AST 36  ALT 23  ALKPHOS 71  BILITOT 0.8  PROT 7.1  ALBUMIN 3.3*   No results for input(s): LIPASE, AMYLASE in the last 72 hours. Cardiac Enzymes No results for input(s): CKTOTAL, CKMB, CKMBINDEX, TROPONINI in the last 72 hours.  BNP: BNP (last 3 results) Recent Labs    09/06/20 2151 09/10/20 1210 09/20/20 1604  BNP 83.6 20.7 21.5    ProBNP (last 3 results) No results for input(s): PROBNP in the last 8760 hours.   D-Dimer No results for input(s): DDIMER in the last 72 hours. Hemoglobin A1C No results for input(s): HGBA1C in the last 72 hours. Fasting Lipid Panel No results for input(s): CHOL, HDL, LDLCALC, TRIG, CHOLHDL, LDLDIRECT in the last 72 hours. Thyroid Function Tests No results for input(s): TSH, T4TOTAL, T3FREE, THYROIDAB in the last 72 hours.  Invalid input(s): FREET3  Other results:   Imaging    No results found.   Medications:     Scheduled Medications:  apixaban  5 mg Oral BID   buprenorphine-naloxone  1 tablet Sublingual BID   gabapentin  800 mg Oral TID   metoprolol succinate  25 mg Oral BID   pantoprazole  40 mg Oral BID   potassium chloride SA  60 mEq Oral TID   pravastatin  20 mg Oral QHS   sertraline  12.5 mg Oral Daily   sodium chloride flush  3 mL Intravenous Q12H   spironolactone  50 mg Oral Daily   tamsulosin  0.4 mg Oral Daily   torsemide  100 mg Oral BID   zolpidem  10 mg Oral QHS    Infusions:  sodium chloride      PRN Medications: sodium chloride, acetaminophen, albuterol, ondansetron (ZOFRAN) IV, polyethylene glycol, sodium chloride flush   Assessment/Plan   1. Acute on chronic diastolic CHF: Suspect prominent RV dysfunction.  Echo in 12/21 with EF 89-37%, grade 2 diastolic dysfunction, RV poorly visualized.  Multiple CHF  admissions.   - Diuresed with IV lasix and transitioned to torsemide 100 mg twice a day and will need metolazone twice a week. Monday and Friday.  - Continue potassium to 60 meq three time a day. Give extra 40 meq potassium today -  Ideally, would have Cardiomems but he declines.   - He did not tolerate jardiance due to GI upset. - Continue spironolactone 50 mg daily, eplerenone at discharge.  - Renal function stable.  2. H/o PE/DVT: Suspect due to sedentary lifestyle.  Occurred in 2/21.  V/Q scan in 6/21 did not show evidence for chronic PE.   - Continue apixaban. 3. OSA: Cannot tolerate CPAP. 4. Anxiety/panic: - Continue sertraline 25 mg daily. 5. CAD: Cath 1/22 with no significant CAD.  6. Hypertension: Stable.  - Continue Toprol XL 25 mg twice a day.   7. Atrial fibrillation: Paroxysmal.  -  Continue apixaban.  - Continue Toprol XL   Can go home from HF perspective.   Heart failure team will sign off as of 09/26/20  HF Team Medication Recommendations for Home: Torsemide 100 mg twice a day  Metolazone 2.5  Monday and Friday  Eplerenone 100 mg daily.  K dur 60 meq  bid  Apixaban 5 mg twice a day  Toprol XL 25 mg twice a day   Other recommendations (Labs,testing, etc): We will check BMET next week. HF Paramedicine set up.   Follow up as an outpatient:  HF follow up set up 6/28 @ 0900  Length of Stay: 3  [name] Clegg, NP  09/26/2020, 8:34 AM  Advanced Heart Failure Team Pager [phone] (M-F; 7a - 5p)  Please contact [facility] for night-coverage after hours (5p -7a ) and weekends on [email or web address]  Patient seen with NP, agree with the above note.   Good diuresis, weight down > 10 lbs.  He feels "tired."  I think he is as good as we are going to get him acutely in the hospital.  Can go home on the above medication regimen with followup in CHF clinic.  Will arrange for ongoing paramedicine followup.   Loralie Champagne 09/26/2020 10:20 AM

## 2020-09-28 ENCOUNTER — Encounter (HOSPITAL_COMMUNITY): Payer: Self-pay

## 2020-09-29 ENCOUNTER — Encounter (HOSPITAL_COMMUNITY): Payer: Self-pay

## 2020-09-30 NOTE — Discharge Summary (Signed)
Physician Discharge Summary  Charles Gray:[id number] DOB: 11/05/1955 DOA: 09/20/2020  PCP: Enid Skeens., MD  Admit date: 09/20/2020 Discharge date: 09/26/2020  Admitted From: Home  Disposition:  Home   Recommendations for Outpatient Follow-up:  Follow up with HF clinic as directed BMP per HF team in 1 week       Home Health: OT due to SOB with exertion  Equipment/Devices: None new  Discharge Condition: Good  CODE STATUS: FULL Diet recommendation: Low sodium  Brief/Interim Summary: Mr. Charles Gray is a 65 y.o. M with obesity, history of DVT/PE 2021 on Eliquis, D CHF, OSA intolerant of CPAP, PAF, [name] with compensated cirrhosis, and history of GI bleed who presented with feeling sick, short of breath, and coughing with leg swelling.     PRINCIPAL HOSPITAL DIAGNOSIS: Acute on chronic diastolic CHF    Discharge Diagnoses:   Acute on chronic chronic diastolic CHF Admitted with leg swelling, dyspnea on exertion, and orthopnea.  Started on IV Lasix.  Diuresed 5L, to a dry weight 371 lbs.    HF Team Medication Recommendations for Home: Torsemide 100 mg twice a day  Metolazone 2.5  Monday and Friday Eplerenone 100 mg daily.  K dur 60 meq  bid  Apixaban 5 mg twice a day Toprol XL 25 mg twice a day    HF Paramedicine set up.    HF follow up set up 6/28 @ 0900       Paroxysmal atrial fibrillation Acquired thrombophilia On apixaban and metoprolol, rates controlled  [name] cirrhosis Compensated  Hypokalemia Resolved with treatment  Probable BPH Continue new Flomax  Vertigo Mild.  Patient complained of dizziness with standing and also with turning head down and to the side.  This was mild.  It was not constant, nor associated with ataxia or vomiting.  It is my suspicion that vestibular rehab will have minimal benefit given his low symptoms burden and likewise meclizine which would also cause excess side effects.  Chronic pain On buprenorphine,  sertraline   Sleep apnea CPAP noncompliant  Hyponatremia Mild, asymptomatic   Anemia of chronic disease Due to CHF, stable, no bleeding              Discharge Instructions  Discharge Instructions     Diet - low sodium heart healthy   Complete by: As directed    Discharge instructions   Complete by: As directed    Increase your torsemide to 100 mg (1 tablet of the new strength) twice daily Increase your potassium to 60 mEq (3 tablets) twice daily Increase your metolazone to twice weekly (not just Fridays but Mondays and Fridays)  Follow up with the heart failure paramedicine doctors Follow up with Dr. [name]  Follow up with your primary doctor if the vertigo continues  For your bladder, take tamsulosin/Flomax 0.4 (1 tab) daily This eases the prostate and makes it easier to pee  If it helps, continue it and follow up with your doctor   Increase activity slowly   Complete by: As directed       Allergies as of 09/26/2020       Reactions   Penicillins    Broke out in convulsions as a child  Did it involve swelling of the face/tongue/throat, SOB, or low BP? N/A Did it involve sudden or severe rash/hives, skin peeling, or any reaction on the inside of your mouth or nose? N/A Did you need to seek medical attention at a hospital or doctor's office?N/A When did it last  happen? N/A If all above answers are "NO", may proceed with cephalosporin use.        Medication List     STOP taking these medications    docusate sodium 100 MG capsule Commonly known as: Colace   traMADol 50 MG tablet Commonly known as: ULTRAM       TAKE these medications    acetaminophen 500 MG tablet Commonly known as: TYLENOL Take 1,000 mg by mouth every 6 (six) hours as needed for moderate pain or headache.   apixaban 5 MG Tabs tablet Commonly known as: Eliquis Take 1 tablet (5 mg total) by mouth 2 (two) times daily.   Buprenorphine HCl-Naloxone HCl 8-2 MG Film Place 1  tablet under the tongue in the morning and at bedtime.   eplerenone 50 MG tablet Commonly known as: INSPRA Take 2 tablets (100 mg total) by mouth daily.   gabapentin 800 MG tablet Commonly known as: NEURONTIN Take 1 tablet (800 mg total) by mouth 3 (three) times daily.   metolazone 2.5 MG tablet Commonly known as: ZAROXOLYN Take 1 tablet (2.5 mg total) by mouth 2 (two) times a week. Monday and Fridays What changed:  when to take this additional instructions   metoprolol succinate 25 MG 24 hr tablet Commonly known as: Toprol XL Take 1 tablet (25 mg total) by mouth 2 (two) times daily.   ondansetron 4 MG disintegrating tablet Commonly known as: Zofran ODT Take 1 tablet (4 mg total) by mouth every 8 (eight) hours as needed for nausea or vomiting.   pantoprazole 40 MG tablet Commonly known as: PROTONIX Take 40 mg by mouth 2 (two) times daily.   polyethylene glycol powder 17 GM/SCOOP powder Commonly known as: GLYCOLAX/MIRALAX Take 17 g by mouth daily as needed for mild constipation.   potassium chloride SA 20 MEQ tablet Commonly known as: KLOR-CON Take 3 tablets (60 mEq total) by mouth 2 (two) times daily. What changed: how much to take   pravastatin 20 MG tablet Commonly known as: PRAVACHOL Take 1 tablet (20 mg total) by mouth daily. What changed: when to take this   senna 8.6 MG Tabs tablet Commonly known as: SENOKOT Take 2 tablets (17.2 mg total) by mouth daily.   sertraline 25 MG tablet Commonly known as: ZOLOFT Take 1 tablet (25 mg total) by mouth daily. What changed: how much to take   sodium chloride 0.65 % Soln nasal spray Commonly known as: OCEAN Place 1 spray into both nostrils as needed for congestion.   tamsulosin 0.4 MG Caps capsule Commonly known as: FLOMAX Take 1 capsule (0.4 mg total) by mouth daily.   torsemide 100 MG tablet Commonly known as: DEMADEX Take 1 tablet (100 mg total) by mouth 2 (two) times daily. What changed:  medication  strength See the new instructions.   Ventolin HFA 108 (90 Base) MCG/ACT inhaler Generic drug: albuterol Inhale 1 puff into the lungs every 4 (four) hours as needed for shortness of breath.   zolpidem 10 MG tablet Commonly known as: AMBIEN Take 10 mg by mouth at bedtime.        Follow-up Information     [facility] Follow up on 10/08/2020.   Specialty: Cardiology Why: at 0900 Contact information: [address] [id number] [place]        Enid Skeens., MD. Daphane Shepherd on 10/10/2020.   Specialty: Family Medicine Why: @2 :15pm Contact information: [address] [address]  [phone]         Berniece Salines, DO .   Specialty: Cardiology Contact information: [place] [address] [place], [facility] Follow up.   Specialty: Home Health Services Why: HHPT Contact information: [address] [place] [address] [phone]                Allergies  Allergen Reactions   Penicillins     Broke out in convulsions as a child  Did it involve swelling of the face/tongue/throat, SOB, or low BP? N/A Did it involve sudden or severe rash/hives, skin peeling, or any reaction on the inside of your mouth or nose? N/A Did you need to seek medical attention at a hospital or doctor's office?N/A When did it last happen? N/A If all above answers are "NO", may proceed with cephalosporin use.    Consultations: Cardiology   Procedures/Studies: DG Chest 2 View  Result Date: 09/06/2020 CLINICAL DATA:  Shortness of breath and lower extremity edema EXAM: CHEST - 2 VIEW COMPARISON:  06/09/2020, 05/07/2020, 09/25/2019 FINDINGS: Chronic bronchitic changes in the lower lungs. No consolidation, pleural effusion or pneumothorax. Normal cardiomediastinal silhouette. No pneumothorax. IMPRESSION: No active cardiopulmonary disease.  Electronically Signed   By: Donavan Foil M.D.   On: 09/06/2020 22:39   DG Chest Port 1 View  Result Date: 09/20/2020 CLINICAL DATA:  Shortness of breath EXAM: PORTABLE CHEST 1 VIEW COMPARISON:  09/06/2020 FINDINGS: Cardiac shadow is within normal limits. Lungs are well aerated bilaterally. No focal infiltrate or effusion is seen. No bony abnormality is noted. IMPRESSION: No active disease. Electronically Signed   By: Inez Catalina M.D.   On: 09/20/2020 18:50   LONG TERM MONITOR (3-14 DAYS)  Result Date: 09/26/2020 Patch Wear Time:  6 days and 22 hours (2022-05-31T12:06:11-398 to 2022-06-07T10:12:58-0400) Patient had a min HR of 46 bpm, max HR of 182 bpm, and avg HR of 77 bpm. Predominant underlying rhythm was Sinus Rhythm. 677 Supraventricular Tachycardia runs occurred, the run with the fastest interval lasting 9.6 secs with a max rate of 182 bpm, the longest lasting 4 mins 2 secs with an avg rate of 114 bpm. Supraventricular Tachycardia was detected within +/- 45 seconds of symptomatic patient event(s). Isolated SVEs were occasional (3.3%, 24486), SVE Couplets were occasional (2.1%, 7787), and SVE Triplets were occasional (2.2%, 5420). Isolated VEs were rare (<1.0%), VE Couplets were rare (<1.0%), and no VE Triplets were present. Ventricular Trigeminy was present. Conclusion: 1. Primarily NSR. 2. Frequent PACs (3.3% of total beats), rare PVCs. 3. Short SVT runs, longest lasted 4 minutes.  4. No atrial fibrillation noted.      Subjective: Complains of some vertigo.  Some foot pain, some general malaie.  No dyspnea, no orthopnea, no confusion, no fever, no cough.  Discharge Exam: Vitals:   09/26/20 0837 09/26/20 1102  BP: 123/76 108/65  Pulse: 65 69  Resp:    Temp: 98.2 F (36.8 C)   SpO2: 100% 97%   Vitals:   09/26/20 0408 09/26/20 0736 09/26/20 0837 09/26/20 1102  BP: 109/76 109/65 123/76 108/65  Pulse: 66 65 65 69  Resp: 19     Temp: 98 F (36.7 C)  98.2 F (36.8 C)   TempSrc:  Oral  Oral   SpO2: 97%  100% 97%  Weight: (!) 168.2 kg     Height:        General: Pt is alert, awake, not  in acute distress Cardiovascular: RRR, nl S1-S2, no murmurs appreciated.   No LE edema.   Respiratory: Normal respiratory rate and rhythm.  CTAB without rales or wheezes. Abdominal: Abdomen soft and non-tender.  No distension or HSM.   Neuro/Psych: Strength symmetric in upper and lower extremities.  Judgment and insight appear normal.   The results of significant diagnostics from this hospitalization (including imaging, microbiology, ancillary and laboratory) are listed below for reference.     Microbiology: Recent Results (from the past 240 hour(s))  Resp Panel by RT-PCR (Flu A&B, Covid) Nasopharyngeal Swab     Status: None   Collection Time: 09/20/20  8:56 PM   Specimen: Nasopharyngeal Swab; Nasopharyngeal(NP) swabs in vial transport medium  Result Value Ref Range Status   SARS Coronavirus 2 by RT PCR NEGATIVE NEGATIVE Final    Comment: (NOTE) SARS-CoV-2 target nucleic acids are NOT DETECTED.  The SARS-CoV-2 RNA is generally detectable in upper respiratory specimens during the acute phase of infection. The lowest concentration of SARS-CoV-2 viral copies this assay can detect is 138 copies/mL. A negative result does not preclude SARS-Cov-2 infection and should not be used as the sole basis for treatment or other patient management decisions. A negative result may occur with  improper specimen collection/handling, submission of specimen other than nasopharyngeal swab, presence of viral mutation(s) within the areas targeted by this assay, and inadequate number of viral copies(<138 copies/mL). A negative result must be combined with clinical observations, patient history, and epidemiological information. The expected result is Negative.  Fact Sheet for Patients:  EntrepreneurPulse.com.au  Fact Sheet for Healthcare Providers:   IncredibleEmployment.be  This test is no t yet approved or cleared by the [place] FDA and  has been authorized for detection and/or diagnosis of SARS-CoV-2 by FDA under an Emergency Use Authorization (EUA). This EUA will remain  in effect (meaning this test can be used) for the duration of the COVID-19 declaration under Section 564(b)(1) of the Act, 21 U.S.C.section 360bbb-3(b)(1), unless the authorization is terminated  or revoked sooner.       Influenza A by PCR NEGATIVE NEGATIVE Final   Influenza B by PCR NEGATIVE NEGATIVE Final    Comment: (NOTE) The Xpert Xpress SARS-CoV-2/FLU/RSV plus assay is intended as an aid in the diagnosis of influenza from Nasopharyngeal swab specimens and should not be used as a sole basis for treatment. Nasal washings and aspirates are unacceptable for Xpert Xpress SARS-CoV-2/FLU/RSV testing.  Fact Sheet for Patients: EntrepreneurPulse.com.au  Fact Sheet for Healthcare Providers: IncredibleEmployment.be  This test is not yet approved or cleared by the [place] FDA and has been authorized for detection and/or diagnosis of SARS-CoV-2 by FDA under an Emergency Use Authorization (EUA). This EUA will remain in effect (meaning this test can be used) for the duration of the COVID-19 declaration under Section 564(b)(1) of the Act, 21 U.S.C. section 360bbb-3(b)(1), unless the authorization is terminated or revoked.  Performed at [facility], [place] [address] [place], [place] [address]      Labs: BNP (last 3 results) Recent Labs    09/06/20 2151 09/10/20 1210 09/20/20 1604  BNP 83.6 20.7 34.1   Basic Metabolic Panel: Recent Labs  Lab 09/23/20 1620 09/24/20 0337 09/24/20 1426 09/25/20 0334 09/26/20 0347  NA 133* 134* 130* 133* 134*  K 2.8* 3.3* 3.4* 3.3* 3.3*  CL 88* 89* 89* 91* 90*  CO2 34* 36* 30 33* 34*  GLUCOSE 103* 110* 111* 120* 112*  BUN 14 16 18 18  19  CREATININE 0.92 0.97 1.06 0.98 1.13  CALCIUM 9.1 9.4 9.7 9.6 9.0  MG  --  2.3  --  2.6* 2.4   Liver Function Tests: Recent Labs  Lab 09/24/20 0337  AST 36  ALT 23  ALKPHOS 71  BILITOT 0.8  PROT 7.1  ALBUMIN 3.3*   No results for input(s): LIPASE, AMYLASE in the last 168 hours. No results for input(s): AMMONIA in the last 168 hours. CBC: Recent Labs  Lab 09/24/20 0337 09/25/20 0334 09/26/20 0347  WBC 6.8 7.4 6.5  NEUTROABS 4.6 5.0 4.5  HGB 11.1* 11.6* 11.5*  HCT 36.2* 38.6* 37.8*  MCV 79.2* 79.9* 79.2*  PLT 144* 164 149*   Cardiac Enzymes: No results for input(s): CKTOTAL, CKMB, CKMBINDEX, TROPONINI in the last 168 hours. BNP: Invalid input(s): POCBNP CBG: No results for input(s): GLUCAP in the last 168 hours. D-Dimer No results for input(s): DDIMER in the last 72 hours. Hgb A1c No results for input(s): HGBA1C in the last 72 hours. Lipid Profile No results for input(s): CHOL, HDL, LDLCALC, TRIG, CHOLHDL, LDLDIRECT in the last 72 hours. Thyroid function studies No results for input(s): TSH, T4TOTAL, T3FREE, THYROIDAB in the last 72 hours.  Invalid input(s): FREET3 Anemia work up No results for input(s): VITAMINB12, FOLATE, FERRITIN, TIBC, IRON, RETICCTPCT in the last 72 hours. Urinalysis    Component Value Date/Time   COLORURINE YELLOW 08/13/2020 1953   APPEARANCEUR CLEAR 08/13/2020 1953   LABSPEC 1.011 08/13/2020 1953   PHURINE 6.0 08/13/2020 1953   GLUCOSEU NEGATIVE 08/13/2020 1953   HGBUR NEGATIVE 08/13/2020 1953   BILIRUBINUR NEGATIVE 08/13/2020 1953   KETONESUR NEGATIVE 08/13/2020 1953   PROTEINUR NEGATIVE 08/13/2020 1953   NITRITE NEGATIVE 08/13/2020 1953   LEUKOCYTESUR NEGATIVE 08/13/2020 1953   Sepsis Labs Invalid input(s): PROCALCITONIN,  WBC,  LACTICIDVEN Microbiology Recent Results (from the past 240 hour(s))  Resp Panel by RT-PCR (Flu A&B, Covid) Nasopharyngeal Swab     Status: None   Collection Time: 09/20/20  8:56 PM   Specimen:  Nasopharyngeal Swab; Nasopharyngeal(NP) swabs in vial transport medium  Result Value Ref Range Status   SARS Coronavirus 2 by RT PCR NEGATIVE NEGATIVE Final    Comment: (NOTE) SARS-CoV-2 target nucleic acids are NOT DETECTED.  The SARS-CoV-2 RNA is generally detectable in upper respiratory specimens during the acute phase of infection. The lowest concentration of SARS-CoV-2 viral copies this assay can detect is 138 copies/mL. A negative result does not preclude SARS-Cov-2 infection and should not be used as the sole basis for treatment or other patient management decisions. A negative result may occur with  improper specimen collection/handling, submission of specimen other than nasopharyngeal swab, presence of viral mutation(s) within the areas targeted by this assay, and inadequate number of viral copies(<138 copies/mL). A negative result must be combined with clinical observations, patient history, and epidemiological information. The expected result is Negative.  Fact Sheet for Patients:  EntrepreneurPulse.com.au  Fact Sheet for Healthcare Providers:  IncredibleEmployment.be  This test is no t yet approved or cleared by the [place] FDA and  has been authorized for detection and/or diagnosis of SARS-CoV-2 by FDA under an Emergency Use Authorization (EUA). This EUA will remain  in effect (meaning this test can be used) for the duration of the COVID-19 declaration under Section 564(b)(1) of the Act, 21 U.S.C.section 360bbb-3(b)(1), unless the authorization is terminated  or revoked sooner.       Influenza A by PCR NEGATIVE NEGATIVE Final   Influenza B by PCR NEGATIVE  NEGATIVE Final    Comment: (NOTE) The Xpert Xpress SARS-CoV-2/FLU/RSV plus assay is intended as an aid in the diagnosis of influenza from Nasopharyngeal swab specimens and should not be used as a sole basis for treatment. Nasal washings and aspirates are unacceptable for  Xpert Xpress SARS-CoV-2/FLU/RSV testing.  Fact Sheet for Patients: EntrepreneurPulse.com.au  Fact Sheet for Healthcare Providers: IncredibleEmployment.be  This test is not yet approved or cleared by the [place] FDA and has been authorized for detection and/or diagnosis of SARS-CoV-2 by FDA under an Emergency Use Authorization (EUA). This EUA will remain in effect (meaning this test can be used) for the duration of the COVID-19 declaration under Section 564(b)(1) of the Act, 21 U.S.C. section 360bbb-3(b)(1), unless the authorization is terminated or revoked.  Performed at [facility], [place] [address] [place], [place] [address]      Time coordinating discharge: 35 minutes   30 Day Unplanned Readmission Risk Score    Flowsheet Row ED to Hosp-Admission (Discharged) from 09/20/2020 in [facility] [facility]  30 Day Unplanned Readmission Risk Score (%) 39.03 Filed at 09/26/2020 1600       This score is the patient's risk of an unplanned readmission within 30 days of being discharged (0 -100%). The score is based on dignosis, age, lab data, medications, orders, and past utilization.   Low:  0-14.9   Medium: 15-21.9   High: 22-29.9   Extreme: 30 and above             SIGNED:   Edwin Dada, MD  [facility] 09/26/2020, 7:33 PM

## 2020-10-01 ENCOUNTER — Other Ambulatory Visit (HOSPITAL_COMMUNITY): Payer: Self-pay

## 2020-10-01 ENCOUNTER — Telehealth (INDEPENDENT_AMBULATORY_CARE_PROVIDER_SITE_OTHER): Payer: Medicare HMO | Admitting: Internal Medicine

## 2020-10-01 ENCOUNTER — Other Ambulatory Visit: Payer: Self-pay

## 2020-10-01 DIAGNOSIS — I872 Venous insufficiency (chronic) (peripheral): Secondary | ICD-10-CM

## 2020-10-01 NOTE — Progress Notes (Signed)
Paramedicine Encounter    Patient ID: Charles Gray, male    DOB: 09/20/1955, 65 y.o.   MRN: [id number]   Patient Care Team: Enid Skeens., MD as PCP - General (Family Medicine) Berniece Salines, DO as PCP - Cardiology (Cardiology) Jorge Ny, LCSW as Social Worker (Licensed Clinical Social Worker)  Patient Active Problem List   Diagnosis Date Noted   Chronic venous stasis dermatitis of both lower extremities 08/27/2020   Cellulitis of both lower extremities 08/14/2020   Microcytic anemia 08/14/2020   GERD without esophagitis 06/10/2020   Atrial fibrillation with rapid ventricular response ([place]) 03/24/2020   Acute on chronic right heart failure ([place]) 01/27/2020   Acute respiratory failure with hypoxia ([place])    Fatty liver disease, nonalcoholic    Shock ([place])    Personal history of DVT (deep vein thrombosis) 12/26/2019   Chronic anticoagulation 12/26/2019   Palpitations 12/26/2019   AF (paroxysmal atrial fibrillation) ([place]) 12/23/2019   Heart failure, systolic, acute ([place]) 67/59/1638   Acute on chronic congestive heart failure (HCC)    Acute on chronic diastolic CHF (congestive heart failure) ([place]) 08/23/2019   Chest pain 08/23/2019   Lactic acidosis 08/23/2019   History of pulmonary embolism 08/12/2019   Atrial tachycardia ([place]) 08/12/2019   Occasional tremors 08/12/2019   OSA (obstructive sleep apnea) 08/12/2019   Acute on chronic right-sided heart failure ([place]) 08/06/2019   Pulmonary embolism ([place]) 06/08/2019   Normocytic anemia 06/08/2019   Pulmonary emboli ([place]) 06/08/2019   Chronic heart failure with preserved ejection fraction ([place]) 05/30/2019   Class 2 severe obesity due to excess calories with serious comorbidity and body mass index (BMI) of 39.0 to 39.9 in adult ([place]) 05/30/2019   Morbid obesity with BMI of 40.0-44.9, adult ([place]) 05/30/2019   Hyponatremia    Drug induced constipation    Peripheral edema    Hypotension due to drugs    Hypoalbuminemia due to  protein-calorie malnutrition (HCC)    Hypokalemia    Thrombocytopenia (HCC)    Anemia of chronic disease    Cirrhosis of liver without ascites (HCC)    Chronic pain syndrome    Debility 03/13/2019   Portal hypertensive gastropathy ([place]) 03/06/2019   Dyspnea    GIB (gastrointestinal bleeding) 03/04/2019   Mixed hyperlipidemia 01/24/2019   Insomnia 01/24/2019   Hyperlipidemia 01/24/2019   Essential hypertension 01/24/2019   Lumbar disc herniation 01/24/2019    Current Outpatient Medications:    acetaminophen (TYLENOL) 500 MG tablet, Take 1,000 mg by mouth every 6 (six) hours as needed for moderate pain or headache., Disp: , Rfl:    apixaban (ELIQUIS) 5 MG TABS tablet, Take 1 tablet (5 mg total) by mouth 2 (two) times daily., Disp: 60 tablet, Rfl: 0   Buprenorphine HCl-Naloxone HCl 8-2 MG FILM, Place 1 tablet under the tongue in the morning and at bedtime. , Disp: , Rfl:    eplerenone (INSPRA) 50 MG tablet, Take 2 tablets (100 mg total) by mouth daily., Disp: 60 tablet, Rfl: 3   gabapentin (NEURONTIN) 800 MG tablet, Take 1 tablet (800 mg total) by mouth 3 (three) times daily., Disp: 90 tablet, Rfl: 1   metolazone (ZAROXOLYN) 2.5 MG tablet, Take 1 tablet (2.5 mg total) by mouth 2 (two) times a week. Monday and Fridays, Disp: 10 tablet, Rfl: 3   metoprolol succinate (TOPROL XL) 25 MG 24 hr tablet, Take 1 tablet (25 mg total) by mouth 2 (two) times daily., Disp: 180 tablet, Rfl: 3   ondansetron (ZOFRAN  ODT) 4 MG disintegrating tablet, Take 1 tablet (4 mg total) by mouth every 8 (eight) hours as needed for nausea or vomiting., Disp: 20 tablet, Rfl: 0   pantoprazole (PROTONIX) 40 MG tablet, Take 40 mg by mouth 2 (two) times daily., Disp: , Rfl:    polyethylene glycol powder (GLYCOLAX/MIRALAX) 17 GM/SCOOP powder, Take 17 g by mouth daily as needed for mild constipation., Disp: , Rfl:    potassium chloride SA (KLOR-CON) 20 MEQ tablet, Take 3 tablets (60 mEq total) by mouth 2 (two) times daily.,  Disp: 120 tablet, Rfl: 11   pravastatin (PRAVACHOL) 20 MG tablet, Take 1 tablet (20 mg total) by mouth daily., Disp: 30 tablet, Rfl: 1   senna (SENOKOT) 8.6 MG TABS tablet, Take 2 tablets (17.2 mg total) by mouth daily., Disp: 120 tablet, Rfl: 0   sertraline (ZOLOFT) 25 MG tablet, Take 1 tablet (25 mg total) by mouth daily., Disp: 30 tablet, Rfl: 0   sodium chloride (OCEAN) 0.65 % SOLN nasal spray, Place 1 spray into both nostrils as needed for congestion., Disp: , Rfl:    tamsulosin (FLOMAX) 0.4 MG CAPS capsule, Take 1 capsule (0.4 mg total) by mouth daily., Disp: 30 capsule, Rfl: 3   torsemide (DEMADEX) 100 MG tablet, Take 1 tablet (100 mg total) by mouth 2 (two) times daily., Disp: 60 tablet, Rfl: 3   VENTOLIN HFA 108 (90 Base) MCG/ACT inhaler, Inhale 1 puff into the lungs every 4 (four) hours as needed for shortness of breath., Disp: 6.7 g, Rfl: 1   zolpidem (AMBIEN) 10 MG tablet, Take 10 mg by mouth at bedtime., Disp: , Rfl:  Allergies  Allergen Reactions   Penicillins     Broke out in convulsions as a child  Did it involve swelling of the face/tongue/throat, SOB, or low BP? N/A Did it involve sudden or severe rash/hives, skin peeling, or any reaction on the inside of your mouth or nose? N/A Did you need to seek medical attention at a hospital or doctor's office?N/A When did it last happen? N/A If all above answers are "NO", may proceed with cephalosporin use.     Social History   Socioeconomic History   Marital status: Single    Spouse name: Not on file   Number of children: Not on file   Years of education: Not on file   Highest education level: Not on file  Occupational History   Not on file  Tobacco Use   Smoking status: Never   Smokeless tobacco: Never  Vaping Use   Vaping Use: Never used  Substance and Sexual Activity   Alcohol use: Not Currently   Drug use: Not Currently    Types: Marijuana    Comment: Smoked for 30 years   Sexual activity: Not on file  Other  Topics Concern   Not on file  Social History Narrative   Not on file   Social Determinants of Health   Financial Resource Strain: Medium Risk   Difficulty of Paying Living Expenses: Somewhat hard  Food Insecurity: No Food Insecurity   Worried About Running Out of Food in the Last Year: Never true   Ran Out of Food in the Last Year: Never true  Transportation Needs: Unmet Transportation Needs   Lack of Transportation (Medical): Yes   Lack of Transportation (Non-Medical): No  Physical Activity: Not on file  Stress: Not on file  Social Connections: Not on file  Intimate Partner Violence: Not on file    Physical Exam Vitals  reviewed.  Constitutional:      Appearance: Normal appearance. He is normal weight.  HENT:     Head: Normocephalic.     Nose: Nose normal.     Mouth/Throat:     Mouth: Mucous membranes are moist.     Pharynx: Oropharynx is clear.  Eyes:     Conjunctiva/sclera: Conjunctivae normal.     Pupils: Pupils are equal, round, and reactive to light.  Cardiovascular:     Rate and Rhythm: Normal rate and regular rhythm.     Pulses: Normal pulses.     Heart sounds: Normal heart sounds.  Pulmonary:     Effort: Pulmonary effort is normal. No respiratory distress.     Breath sounds: Normal breath sounds.  Abdominal:     General: Abdomen is flat.     Palpations: Abdomen is soft.  Musculoskeletal:        General: Swelling present. Normal range of motion.     Cervical back: Normal range of motion.     Right lower leg: Edema present.     Left lower leg: Edema present.  Skin:    General: Skin is warm and dry.     Capillary Refill: Capillary refill takes less than 2 seconds.  Neurological:     General: No focal deficit present.     Mental Status: He is alert. Mental status is at baseline.  Psychiatric:        Mood and Affect: Mood normal.   Arrived for home visit for Jessie who reports he was feeling okay today and is thankful for being home from the hospital. I  obtained vitals which are as noted in report. I assessed Arjun's legs which were swollen but less than usual. Skin color was red with some open wounds that were in healing phases. Noa says he feels they look good today. I reviewed upcoming appointments with Brenin and set up transportation with [facility] for next weeks visit. I reviewed medications and filled pill box accordingly. Teryn reports he wants to be able to stay home and be healthy but sometimes he just feels so bad. I reviewed dietary advice and importance of being compliant with all medications and to elevate his legs and keep them clean. He agreed with plan. Home visit complete. I will see Rolly in one week.   Refills: NONE        Future Appointments  Date Time Provider [place]  10/01/2020  2:00 PM Michel Bickers, MD [facility]  10/08/2020  9:00 AM [facility] PA/NP [facility] None  10/09/2020  1:15 PM Woodroe Chen III, PA-C [facility]     ACTION: Home visit completed

## 2020-10-01 NOTE — Progress Notes (Signed)
Virtual Visit via Telephone Note  I connected with Antino Mayabb on 10/01/20 at  2:00 PM EDT by telephone and verified that I am speaking with the correct person using two identifiers.  Location: Patient: Home Provider: [facility]   I discussed the limitations, risks, security and privacy concerns of performing an evaluation and management service by telephone and the availability of in person appointments. I also discussed with the patient that there may be a patient responsible charge related to this service. The patient expressed understanding and agreed to proceed.   History of Present Illness: I called and spoke with Mr. Ciampi today.  He was recently hospitalized with an exacerbation of his heart failure.  He had gained about 12 pounds just prior to his hospitalization.  He was diuresed.  He is feeling weak.  He is still bothered by uncomfortable swelling in both lower legs.  He has some open sores on his left foot but no fever or other signs of active infection.   Observations/Objective:   Assessment and Plan: He has chronic lymphedema due to venous stasis.  He is susceptible to recurrent cellulitis but does not have any active infection at this time.  Follow Up Instructions: Follow-up here as needed He has an initial visit scheduled at the wound clinic with Jeri Cos on 10/09/2020   I discussed the assessment and treatment plan with the patient. The patient was provided an opportunity to ask questions and all were answered. The patient agreed with the plan and demonstrated an understanding of the instructions.   The patient was advised to call back or seek an in-person evaluation if the symptoms worsen or if the condition fails to improve as anticipated.  I provided 13 minutes of non-face-to-face time during this encounter.   Michel Bickers, MD

## 2020-10-05 ENCOUNTER — Encounter (HOSPITAL_COMMUNITY): Payer: Self-pay

## 2020-10-07 ENCOUNTER — Encounter (HOSPITAL_COMMUNITY): Payer: Self-pay

## 2020-10-07 NOTE — Progress Notes (Signed)
ADVANCED HF CLINIC NOTE  Primary Care: Enid Skeens., MD Primary Cardiologist: Dr. Berniece Salines, DO  HF Cardiology: Dr. [name]  HPI: 65 y.o. 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    He has had multiple hospitalizations in 2021 for CHF exacerbation, requiring IV Lasix and change in PO regimen from lasix to torsemide. Echo 4/21 showed normal LVEF 55-60% w/ G1DD. RV not well visualized, but felt to have component of RV failure. [place] 4/21 showed normal PA pressure and normal cardiac output. PFTs also completed and c/w mild obstructive airway disease.   In 7/21, he had a coronary CTA.  This showed no coronary calcium but there was a concern for soft plaque in the distal LCx and distal RCA with hemodynamically significant stenosis.  He was managed medically. He has not had any chest pain, but was started on ranolazine after his CTA.  He feels like it makes his "head fuzzy."    Admitted with A/C diastolic heart failure. Diuresed with IV lasix and transitioned to bumex 3 mg twice a day. Discharge weight 360 pounds. Discharged 02/09/2020.   He was admitted again in 12/21 with CHF.  Echo in 12/21 showed EF 15-40%, grade 2 diastolic dysfunction, RV poorly visualized.   OV on 12/21 still having significant exertional dyspnea, short of breath just walking around the house.  + Orthopnea, has to sleep on the sofa.  Sleeps poorly, cannot tolerate CPAP.  Cardiomems was discussed but patient reluctant, Jardiance added, metolazone increased to 2 x/week, metoprolol stopped, and Toprol XL 25 mg daily started.  Admitted 0/86/76 with A/C diastolic heart failure. Diuresed with IV lasix + metolazone. Transitioned to torsemide 60 mg twice a day and metolazone twice a day. D/C weight 364 pounds.   Admitted 5/28//22 with cellulitis. Treated with antibiotics.   Returns 09/10/20 for HF follow up. Had palpitations and  EMS checked his EKG. EKG showed SR with rate in the 70s. Having ongoing leg swelling. SOB with exertion.  Weight at home 373-381 pounds. Taking all medications. Meds are set up in a pill bo by HF Paramedicine. Followed cosely by HF Paramedicine. Requires assistance with transportation. Lives alone. Zio patch placed.  Admitted 09/20/20 admitted with SOB and leg swelling secondary to A/CHF. He was diuresed with IV lasix 5 L, weight 371 at discharge.  Today he returns for HF follow up. Says he is feeling bad, stomach hurts, urinating on himself some this past weekend. Says his breathing is about the same, dyspnea with minimal activity. He gets dizzy when he turns his head. CP intermittently, resolves with rest. Sleeps in recliner chronically. Appetite poor. No fever or chills. Weight at home 370 pounds. Taking all medications. Drinking > 2L of fluids/day. He is being seen by wound care for LE abrasions.  Labs (6/21): K 4.3, creatinine 1.03 Labs (10/21): LDL 73 Labs (12/21): K 3.8, creatinine 0.98 Labs (3/22): K 3.1 Creatinine 0.9  Labs (5/22): K 3.7 Creatinine 0.94  Labs (6/22): K 3.3, creatinine 1.13  PMH: 1. HTN 2. OSA 3. Hyperlipidemia 4. Low back pain 5. PE/DVT in 2/21.  - V/Q scan in 6/21 was not suggestive of chronic PE.  6. Nonalcoholic steatohepatitis.  7. GI bleed in 10/20.  8. Anxiety 9. CAD: Coronary CTA in 7/21 with calcium score 0 but concern for soft plaque distal CFX and RCA.  By [facility], there was flow-limiting stenosis in distal CFX and distal RCA.  10. Chronic diastolic CHF: Echo in 7/58 with EF 55-60%, RV poorly visualized.  - RHC (4/21): mean RA 8, PA 20/8, mean PCWP 11, CI 2.8 - Echo (12/21):EF 83-25%, grade 2 diastolic dysfunction, RV poorly visualized.   11. PFTs (4/21): Mild obstruction.  12. ABIs (10/21): Normal.  Review of Systems: All systems reviewed and negative except as per HPI.   Current Outpatient Medications  Medication Sig Dispense Refill   acetaminophen  (TYLENOL) 500 MG tablet Take 1,000 mg by mouth every 6 (six) hours as needed for moderate pain or headache.     apixaban (ELIQUIS) 5 MG TABS tablet Take 1 tablet (5 mg total) by mouth 2 (two) times daily. 60 tablet 0   Buprenorphine HCl-Naloxone HCl 8-2 MG FILM Place 1 tablet under the tongue in the morning and at bedtime.      eplerenone (INSPRA) 50 MG tablet Take 2 tablets (100 mg total) by mouth daily. 60 tablet 3   gabapentin (NEURONTIN) 800 MG tablet Take 1 tablet (800 mg total) by mouth 3 (three) times daily. 90 tablet 1   metolazone (ZAROXOLYN) 2.5 MG tablet Take 1 tablet (2.5 mg total) by mouth 2 (two) times a week. Monday and Fridays 10 tablet 3   metoprolol succinate (TOPROL XL) 25 MG 24 hr tablet Take 1 tablet (25 mg total) by mouth 2 (two) times daily. 180 tablet 3   ondansetron (ZOFRAN ODT) 4 MG disintegrating tablet Take 1 tablet (4 mg total) by mouth every 8 (eight) hours as needed for nausea or vomiting. 20 tablet 0   pantoprazole (PROTONIX) 40 MG tablet Take 40 mg by mouth 2 (two) times daily.     polyethylene glycol powder (GLYCOLAX/MIRALAX) 17 GM/SCOOP powder Take 17 g by mouth daily as needed for mild constipation.     potassium chloride SA (KLOR-CON) 20 MEQ tablet Take 3 tablets (60 mEq total) by mouth 2 (two) times daily. 120 tablet 11   pravastatin (PRAVACHOL) 20 MG tablet Take 1 tablet (20 mg total) by mouth daily. 30 tablet 1   senna (SENOKOT) 8.6 MG TABS tablet Take 2 tablets (17.2 mg total) by mouth daily. 120 tablet 0   sertraline (ZOLOFT) 25 MG tablet Take 1 tablet (25 mg total) by mouth daily. 30 tablet 0   sodium chloride (OCEAN) 0.65 % SOLN nasal spray Place 1 spray into both nostrils as needed for congestion.     tamsulosin (FLOMAX) 0.4 MG CAPS capsule Take 1 capsule (0.4 mg total) by mouth daily. 30 capsule 3   torsemide (DEMADEX) 100 MG tablet Take 1 tablet (100 mg total) by mouth 2 (two) times daily. 60 tablet 3   VENTOLIN HFA 108 (90 Base) MCG/ACT inhaler Inhale  1 puff into the lungs every 4 (four) hours as needed for shortness of breath. 6.7 g 1   zolpidem (AMBIEN) 10 MG tablet Take 10 mg by mouth at bedtime.     No current facility-administered medications for this encounter.   Allergies  Allergen Reactions   Penicillins     Broke out in convulsions as a child  Did it involve swelling of the face/tongue/throat, SOB, or low BP? N/A Did it involve sudden or severe rash/hives, skin peeling, or any reaction on the inside of your mouth or nose? N/A Did you need to seek medical attention at a hospital or doctor's office?N/A When did it last happen? N/A If all above answers are "NO", may proceed with cephalosporin use.   Social History   Socioeconomic History  Marital status: Single    Spouse name: Not on file   Number of children: Not on file   Years of education: Not on file   Highest education level: Not on file  Occupational History   Not on file  Tobacco Use   Smoking status: Never   Smokeless tobacco: Never  Vaping Use   Vaping Use: Never used  Substance and Sexual Activity   Alcohol use: Not Currently   Drug use: Not Currently    Types: Marijuana    Comment: Smoked for 30 years   Sexual activity: Not on file  Other Topics Concern   Not on file  Social History Narrative   Not on file   Social Determinants of Health   Financial Resource Strain: Medium Risk   Difficulty of Paying Living Expenses: Somewhat hard  Food Insecurity: No Food Insecurity   Worried About Running Out of Food in the Last Year: Never true   Ran Out of Food in the Last Year: Never true  Transportation Needs: Unmet Transportation Needs   Lack of Transportation (Medical): Yes   Lack of Transportation (Non-Medical): No  Physical Activity: Not on file  Stress: Not on file  Social Connections: Not on file  Intimate Partner Violence: Not on file    Family History  Problem Relation Age of Onset   Hyperlipidemia Mother    Hypertension Mother    Stroke  Mother    CAD Maternal Grandmother    CAD Maternal Grandfather    BP 128/70   Wt (!) 170.6 kg (376 lb 3.2 oz)   BMI 39.34 kg/m   Wt Readings from Last 3 Encounters:  10/08/20 (!) 170.6 kg (376 lb 3.2 oz)  10/01/20 (!) 167.8 kg (370 lb)  09/26/20 (!) 168.2 kg (370 lb 12.8 oz)   PHYSICAL EXAM: General:  NAD. No resp difficulty, arrived in wheelchair. HEENT: Normal Neck: Supple. Thick neck, JVP difficult. Carotids 2+ bilat; no bruits. No lymphadenopathy or thryomegaly appreciated. Cor: PMI nondisplaced. Regular rate & rhythm. No rubs, gallops or murmurs. Lungs: Clear Abdomen: Obese, soft, nontender, nondistended. No hepatosplenomegaly. No bruits or masses. Good bowel sounds. Extremities: No cyanosis, clubbing, rash, 1+ LE edema L>R, chronic venous stasis changes to lower extremities. Neuro: Alert & oriented x 3, cranial nerves grossly intact. Moves all 4 extremities w/o difficulty. Flat affect.  ASSESSMENT & PLAN: 1. Chronic diastolic CHF: Suspect prominent RV dysfunction.  Echo in 12/21 with EF 69-48%, grade 2 diastolic dysfunction, RV poorly visualized.  Multiple CHF admissions.   - Diuresed with IV lasix, metolazone and Diamox this past admit. - Chronically NYHA III, volume appears up slightly ,weight is up 6 lbs. - Take metolazone 2.5 mg x 1 today + extra 40 mEq of KCl. - Continue torsemide 100 mg bid + metolazone twice a week. Monday and Friday. - Continue potassium to 60 mEq bid. (had previously been taking 40 KCl bid). - Continue eplerenone 100 mg daily. - Ideally, would have Cardiomems but he declines.   - He did not tolerate Jardiance due to GI upset. - BMET today, repeat 10 days. - He needs to limit his fluid intake to less than 2L/day. 2. H/o PE/DVT: Suspect due to sedentary lifestyle.  Occurred in 2/21.  V/Q scan in 6/21 did not show evidence for chronic PE.   - Continue apixaban. - No bleeding. 3. OSA: Cannot tolerate CPAP. 4. Anxiety/panic: - Continue sertraline 25  mg daily. May need to increase. 5. CAD: Cath 1/22 with no  significant CAD. 6. Hypertension: Stable. - Continue Toprol XL 25 mg bid.   7. Atrial fibrillation: Paroxysmal.  - Continue apixaban.  - Continue Toprol XL.  Follow up in 3-4 weeks with APP. Continue with Paramedicine, appreciate their assistance.  Maricela Bo [facility] FNP-BC  10/08/2020

## 2020-10-08 ENCOUNTER — Other Ambulatory Visit: Payer: Self-pay

## 2020-10-08 ENCOUNTER — Other Ambulatory Visit (HOSPITAL_COMMUNITY): Payer: Self-pay

## 2020-10-08 ENCOUNTER — Ambulatory Visit (HOSPITAL_COMMUNITY)
Admission: RE | Admit: 2020-10-08 | Discharge: 2020-10-08 | Disposition: A | Discharge status: Home / Self Care | Payer: Medicare HMO | Source: Ambulatory Visit | Attending: Family Medicine | Admitting: Family Medicine

## 2020-10-08 ENCOUNTER — Encounter (HOSPITAL_COMMUNITY): Payer: Self-pay

## 2020-10-08 VITALS — BP 128/70 | Wt 376.2 lb

## 2020-10-08 DIAGNOSIS — I10 Essential (primary) hypertension: Secondary | ICD-10-CM

## 2020-10-08 DIAGNOSIS — J449 Chronic obstructive pulmonary disease, unspecified: Secondary | ICD-10-CM | POA: Insufficient documentation

## 2020-10-08 DIAGNOSIS — Z79899 Other long term (current) drug therapy: Secondary | ICD-10-CM | POA: Insufficient documentation

## 2020-10-08 DIAGNOSIS — Z7901 Long term (current) use of anticoagulants: Secondary | ICD-10-CM | POA: Insufficient documentation

## 2020-10-08 DIAGNOSIS — I110 Hypertensive heart disease with heart failure: Secondary | ICD-10-CM | POA: Insufficient documentation

## 2020-10-08 DIAGNOSIS — F419 Anxiety disorder, unspecified: Secondary | ICD-10-CM

## 2020-10-08 DIAGNOSIS — E785 Hyperlipidemia, unspecified: Secondary | ICD-10-CM | POA: Diagnosis not present

## 2020-10-08 DIAGNOSIS — I2510 Atherosclerotic heart disease of native coronary artery without angina pectoris: Secondary | ICD-10-CM

## 2020-10-08 DIAGNOSIS — I480 Paroxysmal atrial fibrillation: Secondary | ICD-10-CM

## 2020-10-08 DIAGNOSIS — Z86718 Personal history of other venous thrombosis and embolism: Secondary | ICD-10-CM | POA: Insufficient documentation

## 2020-10-08 DIAGNOSIS — G4733 Obstructive sleep apnea (adult) (pediatric): Secondary | ICD-10-CM | POA: Diagnosis not present

## 2020-10-08 DIAGNOSIS — Z8249 Family history of ischemic heart disease and other diseases of the circulatory system: Secondary | ICD-10-CM | POA: Insufficient documentation

## 2020-10-08 DIAGNOSIS — Z86711 Personal history of pulmonary embolism: Secondary | ICD-10-CM

## 2020-10-08 DIAGNOSIS — I5032 Chronic diastolic (congestive) heart failure: Secondary | ICD-10-CM

## 2020-10-08 LAB — BASIC METABOLIC PANEL
Anion gap: 15 (ref 5–15)
BUN: 17 mg/dL (ref 8–23)
CO2: 29 mmol/L (ref 22–32)
Calcium: 9.2 mg/dL (ref 8.9–10.3)
Chloride: 93 mmol/L — ABNORMAL LOW (ref 98–111)
Creatinine, Ser: 0.92 mg/dL (ref 0.61–1.24)
GFR, Estimated: >60 mL/min (ref >60)
Glucose, Bld: 120 mg/dL — ABNORMAL HIGH (ref 70–99)
Potassium: 3.8 mmol/L (ref 3.5–5.1)
Sodium: 137 mmol/L (ref 135–145)

## 2020-10-08 MED ORDER — POTASSIUM CHLORIDE CRYS ER 20 MEQ PO TBCR: 60.0000 meq | EXTENDED_RELEASE_TABLET | Freq: Two times a day (BID) | ORAL | 11 refills | Status: DC

## 2020-10-08 NOTE — Progress Notes (Signed)
Paramedicine Encounter    Patient ID: Charles Gray, male    DOB: 08/17/1955, 65 y.o.   MRN: [id number]   Arrived for clinic visit for Charles Gray who was being seen in clinic today by Charles Katz NP. Charles Gray examined Charles Gray and elected to increase his daily Potassium to 60MEq BID.   She also added an extra Metolazone today with 40 MEQ of Potassium.   I ensured pill box reflected same and Charles Gray understood and agreed with plan.    Charles Gray was coached on daily fluid intake as well as reminder of limiting his sodium intake. Charles Gray verbalized agreement.   I filled pill box accordingly. Charles Gray was reminded of his upcoming appointments.   Clinic visit complete. I will see Charles Gray in the home in one week.   Refills: Inspra     ACTION: Home visit completed

## 2020-10-08 NOTE — Patient Instructions (Signed)
Take Metolazone 2.5 mg, one tab today with an extra 40 meq of potassium Increase Potassium to 60 meq twice a day  Labs today We will only contact you if something comes back abnormal or we need to make some changes. Otherwise no news is good news!  Labs needed in 7-10 days  Your physician recommends that you schedule a follow-up appointment in: 3-4 weeks  Do the following things EVERYDAY: Weigh yourself in the morning before breakfast. Write it down and keep it in a log. Take your medicines as prescribed Eat low salt foods--Limit salt (sodium) to 2000 mg per day.  Stay as active as you can everyday Limit all fluids for the day to less than 2 liters  At the [facility], you and your health needs are our priority. As part of our continuing mission to provide you with exceptional heart care, we have created designated Provider Care Teams. These Care Teams include your primary Cardiologist (physician) and Advanced Practice Providers (APPs- Physician Assistants and Nurse Practitioners) who all work together to provide you with the care you need, when you need it.   You may see any of the following providers on your designated Care Team at your next follow up: Dr Glori Bickers Dr Loralie Champagne Dr Patrice Paradise, NP Lyda Jester, [place] Ginnie Smart Audry Riles, PharmD   Please be sure to bring in all your medications bottles to every appointment.

## 2020-10-09 ENCOUNTER — Encounter (HOSPITAL_BASED_OUTPATIENT_CLINIC_OR_DEPARTMENT_OTHER): Payer: Medicare HMO | Attending: Internal Medicine | Admitting: Physician Assistant

## 2020-10-09 ENCOUNTER — Other Ambulatory Visit: Payer: Self-pay

## 2020-10-09 DIAGNOSIS — L97812 Non-pressure chronic ulcer of other part of right lower leg with fat layer exposed: Secondary | ICD-10-CM | POA: Insufficient documentation

## 2020-10-09 DIAGNOSIS — I890 Lymphedema, not elsewhere classified: Secondary | ICD-10-CM | POA: Diagnosis not present

## 2020-10-09 DIAGNOSIS — E669 Obesity, unspecified: Secondary | ICD-10-CM | POA: Diagnosis not present

## 2020-10-09 DIAGNOSIS — I2510 Atherosclerotic heart disease of native coronary artery without angina pectoris: Secondary | ICD-10-CM | POA: Diagnosis not present

## 2020-10-09 DIAGNOSIS — I5042 Chronic combined systolic (congestive) and diastolic (congestive) heart failure: Secondary | ICD-10-CM | POA: Diagnosis not present

## 2020-10-09 DIAGNOSIS — Z79899 Other long term (current) drug therapy: Secondary | ICD-10-CM | POA: Diagnosis not present

## 2020-10-09 DIAGNOSIS — Z86718 Personal history of other venous thrombosis and embolism: Secondary | ICD-10-CM | POA: Insufficient documentation

## 2020-10-09 DIAGNOSIS — Z7901 Long term (current) use of anticoagulants: Secondary | ICD-10-CM | POA: Diagnosis not present

## 2020-10-09 DIAGNOSIS — I110 Hypertensive heart disease with heart failure: Secondary | ICD-10-CM | POA: Insufficient documentation

## 2020-10-09 DIAGNOSIS — I872 Venous insufficiency (chronic) (peripheral): Secondary | ICD-10-CM | POA: Diagnosis present

## 2020-10-09 DIAGNOSIS — Z6839 Body mass index (BMI) 39.0-39.9, adult: Secondary | ICD-10-CM | POA: Insufficient documentation

## 2020-10-09 DIAGNOSIS — I480 Paroxysmal atrial fibrillation: Secondary | ICD-10-CM | POA: Insufficient documentation

## 2020-10-09 NOTE — Progress Notes (Signed)
Charles Gray, Charles Gray ([id number]) Visit Report for 10/09/2020 Chief Complaint Document Details Patient Name: Date of Service: [name] 10/09/2020 1:15 PM Medical Record Number: [id number] Patient Account Number: [id number] Date of Birth/Sex: Treating RN: Aug 21, 1955 (65 y.o. [name] Primary Care Provider: SLA TO [name] HN Other Clinician: Referring Provider: Treating Provider/Extender: [name] SLA TO [name] HN Weeks in Treatment: 0 Information Obtained from: Patient Chief Complaint Right LE Ulcers Electronic Signature(s) Signed: 10/09/2020 2:09:51 PM By: [name] PA-C Entered By: [name] on 10/09/2020 14:09:50 -------------------------------------------------------------------------------- HPI Details Patient Name: Date of Service: Charles Gray RD 10/09/2020 1:15 PM Medical Record Number: [id number] Patient Account Number: [id number] Date of Birth/Sex: Treating RN: 1955/07/28 (65 y.o. [name] Primary Care Provider: SLA TO [name] HN Other Clinician: Referring Provider: Treating Provider/Extender: [name] SLA TO [name] HN Weeks in Treatment: 0 History of Present Illness HPI Description: 10/09/2020 upon evaluation today patient appears to be doing decently well all things considered in regard to his bilateral lower extremities he tells me his been using his compression socks. With that being said he does have an ABI which was done formally on October 2021 which showed a right ABI 1.10 and left 1.29. With that being said he is currently having issues with his congestive heart failure that is been somewhat out of control. For that reason he has been on a significant amount of torsemide. Currently he in fact is taking somewhere around 100 mg a day. He does have a history of chronic venous insufficiency, lymphedema, coronary artery disease, atrial fibrillation, and is on long-term anticoagulant therapy. He tells me has been having  blistering that comes and goes he will have 1 area pop up eventually heal and other areas pop up. Overall he is concerned about getting cellulitis again he was recently treated for that that appears to have completely cleared. He does do daily weight checks at home. Electronic Signature(s) Signed: 10/09/2020 2:28:14 PM By: [name] PA-C Entered By: [name] on 10/09/2020 14:28:13 -------------------------------------------------------------------------------- Physical Exam Details Patient Name: Date of Service: [name] 10/09/2020 1:15 PM Medical Record Number: [id number] Patient Account Number: [id number] Date of Birth/Sex: Treating RN: [birth year] (65 y.o. [name] Primary Care Provider: Other Clinician: SLA TO [name] HN Referring Provider: Treating Provider/Extender: [name] SLA TO [name], JO HN Weeks in Treatment: 0 Constitutional sitting or standing blood pressure is within target range for patient.. pulse regular and within target range for patient.Charles Gray respirations regular, non-labored and within target range for patient.Charles Gray temperature within target range for patient.. Obese and well-hydrated in no acute distress. Eyes conjunctiva clear no eyelid edema noted. pupils equal round and reactive to light and accommodation. Ears, Nose, Mouth, and Throat no gross abnormality of ear auricles or external auditory canals. normal hearing noted during conversation. mucus membranes moist. Respiratory normal breathing without difficulty. Cardiovascular 2+ dorsalis pedis/posterior tibialis pulses. 2+ pitting edema of the bilateral lower extremities. Musculoskeletal normal gait and posture. no significant deformity or arthritic changes, no loss or range of motion, no clubbing. Psychiatric this patient is able to make decisions and demonstrates good insight into disease process. Alert and Oriented x 3. pleasant and cooperative. Notes Upon inspection patient's  wound bed actually showed signs of being fairly good overall in my opinion. There does not appear to be any signs of active infection which is great news and overall very pleased with  where things stand in that regard. With that being said his blood pressure is good and his pulse is good as well as his respirations overall I am pleased and I feel like he is fairly stable. Nonetheless I do think that he has significant pitting edema of the bilateral lower extremities. I do believe that he would benefit from a compression wrap on the right leg to get his wounds healed. Electronic Signature(s) Signed: 10/09/2020 2:28:51 PM By: [name] PA-C Entered By: [name] on 10/09/2020 14:28:51 -------------------------------------------------------------------------------- Physician Orders Details Patient Name: Date of Service: [name] 10/09/2020 1:15 PM Medical Record Number: [id number] Patient Account Number: [id number] Date of Birth/Sex: Treating RN: 03-Mar-1956 (65 y.o. [name] Primary Care Provider: SLA TO [name] HN Other Clinician: Referring Provider: Treating Provider/Extender: [name] SLA TO Cathrine Muster, JO HN Weeks in Treatment: 0 Verbal / Phone Orders: No Diagnosis Coding ICD-10 Coding Code Description I87.2 Venous insufficiency (chronic) (peripheral) I89.0 Lymphedema, not elsewhere classified L97.812 Non-pressure chronic ulcer of other part of right lower leg with fat layer exposed I50.42 Chronic combined systolic (congestive) and diastolic (congestive) heart failure I25.10 Atherosclerotic heart disease of native coronary artery without angina pectoris I48.0 Paroxysmal atrial fibrillation Z79.01 Long term (current) use of anticoagulants Follow-up Appointments ppointment in 1 week. - with Margarita Grizzle Return A Bathing/ Shower/ Hygiene May shower with protection but do not get wound dressing(s) wet. Edema Control - Lymphedema / SCD / Other Bilateral Lower  Extremities Elevate legs to the level of the heart or above for 30 minutes daily and/or when sitting, a frequency of: - throughout the day Avoid standing for long periods of time. Exercise regularly Compression stocking or Garment 20-30 mm/Hg pressure to: - to left leg daily Wound Treatment Wound #1 - Lower Leg Wound Laterality: Right, Anterior Peri-Wound Care: Sween Lotion (Moisturizing lotion) 1 x Per Week/15 Days Discharge Instructions: Apply moisturizing lotion as directed Prim Dressing: KerraCel Ag Gelling Fiber Dressing, 2x2 in (silver alginate) 1 x Per Week/15 Days ary Discharge Instructions: Apply silver alginate to wound bed as instructed Secondary Dressing: Woven Gauze Sponge, Non-Sterile 4x4 in 1 x Per Week/15 Days Discharge Instructions: Apply over primary dressing as directed. Compression Wrap: ThreePress (3 layer compression wrap) 1 x Per Week/15 Days Discharge Instructions: Apply three layer compression as directed. Wound #2 - Lower Leg Wound Laterality: Right, Posterior Peri-Wound Care: Sween Lotion (Moisturizing lotion) 1 x Per Week/15 Days Discharge Instructions: Apply moisturizing lotion as directed Prim Dressing: KerraCel Ag Gelling Fiber Dressing, 2x2 in (silver alginate) 1 x Per Week/15 Days ary Discharge Instructions: Apply silver alginate to wound bed as instructed Secondary Dressing: Woven Gauze Sponge, Non-Sterile 4x4 in 1 x Per Week/15 Days Discharge Instructions: Apply over primary dressing as directed. Compression Wrap: ThreePress (3 layer compression wrap) 1 x Per Week/15 Days Discharge Instructions: Apply three layer compression as directed. Electronic Signature(s) Signed: 10/09/2020 4:36:36 PM By: [name] PA-C Signed: 10/09/2020 5:41:46 PM By: Baruch Gouty RN, BSN Entered By: Baruch Gouty on 10/09/2020 14:21:34 -------------------------------------------------------------------------------- Problem List Details Patient Name: Date of  Service: [name] 10/09/2020 1:15 PM Medical Record Number: [id number] Patient Account Number: [id number] Date of Birth/Sex: Treating RN: January 14, 1956 (65 y.o. [name] Primary Care Provider: SLA TO [name] HN Other Clinician: Referring Provider: Treating Provider/Extender: [name] SLA TO Cathrine Muster, JO HN Weeks in Treatment: 0 Active Problems ICD-10 Encounter Code Description Active Date MDM Diagnosis I87.2 Venous insufficiency (chronic) (  peripheral) 10/09/2020 No Yes I89.0 Lymphedema, not elsewhere classified 10/09/2020 No Yes L97.812 Non-pressure chronic ulcer of other part of right lower leg with fat layer 10/09/2020 No Yes exposed I50.42 Chronic combined systolic (congestive) and diastolic (congestive) heart failure 10/09/2020 No Yes I25.10 Atherosclerotic heart disease of native coronary artery without angina pectoris 10/09/2020 No Yes I48.0 Paroxysmal atrial fibrillation 10/09/2020 No Yes Z79.01 Long term (current) use of anticoagulants 10/09/2020 No Yes Inactive Problems Resolved Problems Electronic Signature(s) Signed: 10/09/2020 2:11:45 PM By: [name] PA-C Previous Signature: 10/09/2020 2:09:35 PM Version By: [name] PA-C Entered By: [name] on 10/09/2020 14:11:45 -------------------------------------------------------------------------------- Progress Note Details Patient Name: Date of Service: Charles Gray RD 10/09/2020 1:15 PM Medical Record Number: [id number] Patient Account Number: [id number] Date of Birth/Sex: Treating RN: 02-16-1956 (65 y.o. [name] Primary Care Provider: SLA TO [name] HN Other Clinician: Referring Provider: Treating Provider/Extender: [name] SLA TO Cathrine Muster, JO HN Weeks in Treatment: 0 Subjective Chief Complaint Information obtained from Patient Right LE Ulcers History of Present Illness (HPI) 10/09/2020 upon evaluation today patient appears to be doing decently well all things  considered in regard to his bilateral lower extremities he tells me his been using his compression socks. With that being said he does have an ABI which was done formally on October 2021 which showed a right ABI 1.10 and left 1.29. With that being said he is currently having issues with his congestive heart failure that is been somewhat out of control. For that reason he has been on a significant amount of torsemide. Currently he in fact is taking somewhere around 100 mg a day. He does have a history of chronic venous insufficiency, lymphedema, coronary artery disease, atrial fibrillation, and is on long-term anticoagulant therapy. He tells me has been having blistering that comes and goes he will have 1 area pop up eventually heal and other areas pop up. Overall he is concerned about getting cellulitis again he was recently treated for that that appears to have completely cleared. He does do daily weight checks at home. Patient History Information obtained from Patient. Allergies penicillin Family History Cancer - Maternal Grandparents,Mother, Heart Disease - Mother,Maternal Grandparents, Hypertension - Mother,Maternal Grandparents, Lung Disease - Mother, Stroke - Mother, No family history of Diabetes, Hereditary Spherocytosis, Kidney Disease, Seizures, Thyroid Problems, Tuberculosis. Social History Never smoker, Marital Status - Single, Alcohol Use - Never, Drug Use - No History, Caffeine Use - Rarely. Medical History Hematologic/Lymphatic Patient has history of Anemia Respiratory Patient has history of Chronic Obstructive Pulmonary Disease (COPD), Sleep Apnea Denies history of Asthma Cardiovascular Patient has history of Angina, Congestive Heart Failure, Deep Vein Thrombosis, Hypertension Neurologic Patient has history of Neuropathy Hospitalization/Surgery History - EGD 03/05/19. - Heart Cath 08/09/19, 05/10/20. Medical A Surgical History  Notes nd Hematologic/Lymphatic Thrombocytopenia Musculoskeletal Lumbar Herniation Review of Systems (ROS) Eyes Complains or has symptoms of Glasses / Contacts. Gastrointestinal Denies complaints or symptoms of Frequent diarrhea, Nausea, Vomiting. Endocrine Denies complaints or symptoms of Heat/cold intolerance. Genitourinary Denies complaints or symptoms of Frequent urination. Integumentary (Skin) Complains or has symptoms of Wounds. Musculoskeletal Complains or has symptoms of Muscle Weakness. Neurologic Complains or has symptoms of Numbness/parasthesias. Psychiatric Denies complaints or symptoms of Claustrophobia, Suicidal. Objective Constitutional sitting or standing blood pressure is within target range for patient.. pulse regular and within target range for patient.Charles Gray respirations regular, non-labored and within target range for patient.Charles Gray temperature within target range for patient.. Obese and well-hydrated in no  acute distress. Vitals Time Taken: 1:30 PM, Height: 82 in, Source: Stated, Weight: 373 lbs, Source: Stated, BMI: 39, Temperature: 98.5 F, Pulse: 79 bpm, Respiratory Rate: 20 breaths/min, Blood Pressure: 114/71 mmHg. Eyes conjunctiva clear no eyelid edema noted. pupils equal round and reactive to light and accommodation. Ears, Nose, Mouth, and Throat no gross abnormality of ear auricles or external auditory canals. normal hearing noted during conversation. mucus membranes moist. Respiratory normal breathing without difficulty. Cardiovascular 2+ dorsalis pedis/posterior tibialis pulses. 2+ pitting edema of the bilateral lower extremities. Musculoskeletal normal gait and posture. no significant deformity or arthritic changes, no loss or range of motion, no clubbing. Psychiatric this patient is able to make decisions and demonstrates good insight into disease process. Alert and Oriented x 3. pleasant and cooperative. General Notes: Upon inspection patient's wound  bed actually showed signs of being fairly good overall in my opinion. There does not appear to be any signs of active infection which is great news and overall very pleased with where things stand in that regard. With that being said his blood pressure is good and his pulse is good as well as his respirations overall I am pleased and I feel like he is fairly stable. Nonetheless I do think that he has significant pitting edema of the bilateral lower extremities. I do believe that he would benefit from a compression wrap on the right leg to get his wounds healed. Integumentary (Hair, Skin) Wound #1 status is Open. Original cause of wound was Gradually Appeared. The date acquired was: 10/09/2020. The wound is located on the Right,Anterior Lower Leg. The wound measures 0.4cm length x 0.4cm width x 0.1cm depth; 0.126cm^2 area and 0.013cm^3 volume. There is Fat Layer (Subcutaneous Tissue) exposed. There is no tunneling or undermining noted. There is a medium amount of serous drainage noted. The wound margin is distinct with the outline attached to the wound base. There is large (67-100%) red granulation within the wound bed. There is no necrotic tissue within the wound bed. Wound #2 status is Open. Original cause of wound was Gradually Appeared. The date acquired was: 08/19/2020. The wound is located on the Right,Posterior Lower Leg. The wound measures 1.7cm length x 0.7cm width x 0.1cm depth; 0.935cm^2 area and 0.093cm^3 volume. There is Fat Layer (Subcutaneous Tissue) exposed. There is no tunneling or undermining noted. There is a medium amount of serous drainage noted. The wound margin is distinct with the outline attached to the wound base. There is large (67-100%) red granulation within the wound bed. There is no necrotic tissue within the wound bed. Assessment Active Problems ICD-10 Venous insufficiency (chronic) (peripheral) Lymphedema, not elsewhere classified Non-pressure chronic ulcer of other  part of right lower leg with fat layer exposed Chronic combined systolic (congestive) and diastolic (congestive) heart failure Atherosclerotic heart disease of native coronary artery without angina pectoris Paroxysmal atrial fibrillation Long term (current) use of anticoagulants Procedures Wound #1 Pre-procedure diagnosis of Wound #1 is a Lymphedema located on the Right,Anterior Lower Leg . There was a Three Layer Compression Therapy Procedure by Leane Call, RN. Post procedure Diagnosis Wound #1: Same as Pre-Procedure Wound #2 Pre-procedure diagnosis of Wound #2 is a Lymphedema located on the Right,Posterior Lower Leg . There was a Three Layer Compression Therapy Procedure by Leane Call, RN. Post procedure Diagnosis Wound #2: Same as Pre-Procedure Plan Follow-up Appointments: Return Appointment in 1 week. - with Charles Gray Shower/ Hygiene: May shower with protection but do not get wound dressing(s) wet. Edema Control - Lymphedema /  SCD / Other: Elevate legs to the level of the heart or above for 30 minutes daily and/or when sitting, a frequency of: - throughout the day Avoid standing for long periods of time. Exercise regularly Compression stocking or Garment 20-30 mm/Hg pressure to: - to left leg daily WOUND #1: - Lower Leg Wound Laterality: Right, Anterior Peri-Wound Care: Sween Lotion (Moisturizing lotion) 1 x Per Week/15 Days Discharge Instructions: Apply moisturizing lotion as directed Prim Dressing: KerraCel Ag Gelling Fiber Dressing, 2x2 in (silver alginate) 1 x Per Week/15 Days ary Discharge Instructions: Apply silver alginate to wound bed as instructed Secondary Dressing: Woven Gauze Sponge, Non-Sterile 4x4 in 1 x Per Week/15 Days Discharge Instructions: Apply over primary dressing as directed. Com pression Wrap: ThreePress (3 layer compression wrap) 1 x Per Week/15 Days Discharge Instructions: Apply three layer compression as directed. WOUND #2: - Lower Leg  Wound Laterality: Right, Posterior Peri-Wound Care: Sween Lotion (Moisturizing lotion) 1 x Per Week/15 Days Discharge Instructions: Apply moisturizing lotion as directed Prim Dressing: KerraCel Ag Gelling Fiber Dressing, 2x2 in (silver alginate) 1 x Per Week/15 Days ary Discharge Instructions: Apply silver alginate to wound bed as instructed Secondary Dressing: Woven Gauze Sponge, Non-Sterile 4x4 in 1 x Per Week/15 Days Discharge Instructions: Apply over primary dressing as directed. Com pression Wrap: ThreePress (3 layer compression wrap) 1 x Per Week/15 Days Discharge Instructions: Apply three layer compression as directed. 1. Would recommend currently that we initiate treatment with a 3 layer compression wrap. I will be a little bit conservative here due to the fact that he does have significant congestive heart failure at all and I do anything too crazy to begin with. 2. I am also can recommend that we go ahead and use silver alginate to the open wound locations. I think this will be the best way to go. 3. I am also can recommend that we have the patient go ahead and elevate his legs much as possible to try to help keep edema under control. We will be using his compression stocking on the left leg. We will see patient back for reevaluation in 1 week here in the clinic. If anything worsens or changes patient will contact our office for additional recommendations. Electronic Signature(s) Signed: 10/09/2020 2:32:29 PM By: [name] PA-C Entered By: [name] on 10/09/2020 14:32:29 -------------------------------------------------------------------------------- HxROS Details Patient Name: Date of Service: Charles Gray RD 10/09/2020 1:15 PM Medical Record Number: [id number] Patient Account Number: [id number] Date of Birth/Sex: Treating RN: 07-Apr-1956 (65 y.o. [name] Primary Care Provider: SLA TO [name] HN Other Clinician: Referring Provider: Treating  Provider/Extender: [name] SLA TO [name], JO HN Weeks in Treatment: 0 Information Obtained From Patient Eyes Complaints and Symptoms: Positive for: Glasses / Contacts Gastrointestinal Complaints and Symptoms: Negative for: Frequent diarrhea; Nausea; Vomiting Endocrine Complaints and Symptoms: Negative for: Heat/cold intolerance Genitourinary Complaints and Symptoms: Negative for: Frequent urination Integumentary (Skin) Complaints and Symptoms: Positive for: Wounds Musculoskeletal Complaints and Symptoms: Positive for: Muscle Weakness Medical History: Past Medical History Notes: Lumbar Herniation Neurologic Complaints and Symptoms: Positive for: Numbness/parasthesias Medical History: Positive for: Neuropathy Psychiatric Complaints and Symptoms: Negative for: Claustrophobia; Suicidal Hematologic/Lymphatic Medical History: Positive for: Anemia Past Medical History Notes: Thrombocytopenia Respiratory Medical History: Positive for: Chronic Obstructive Pulmonary Disease (COPD); Sleep Apnea Negative for: Asthma Cardiovascular Medical History: Positive for: Angina; Congestive Heart Failure; Deep Vein Thrombosis; Hypertension Immunological Oncologic Immunizations Pneumococcal Vaccine: Received Pneumococcal Vaccination: No Implantable Devices None Hospitalization / Surgery  History Type of Hospitalization/Surgery EGD 03/05/19 Heart Cath 08/09/19, 05/10/20 Family and Social History Cancer: Yes - Maternal Grandparents,Mother; Diabetes: No; Heart Disease: Yes - Mother,Maternal Grandparents; Hereditary Spherocytosis: No; Hypertension: Yes - Mother,Maternal Grandparents; Kidney Disease: No; Lung Disease: Yes - Mother; Seizures: No; Stroke: Yes - Mother; Thyroid Problems: No; Tuberculosis: No; Never smoker; Marital Status - Single; Alcohol Use: Never; Drug Use: No History; Caffeine Use: Rarely; Financial Concerns: No; Food, Clothing or Shelter Needs: No; Support System  Lacking: No; Transportation Concerns: No Electronic Signature(s) Signed: 10/09/2020 4:36:36 PM By: [name] PA-C Signed: 10/09/2020 5:38:09 PM By: Lorrin Jackson Entered By: Lorrin Jackson on 10/09/2020 13:40:12 -------------------------------------------------------------------------------- SuperBill Details Patient Name: Date of Service: Charles Gray, [name] 10/09/2020 Medical Record Number: [id number] Patient Account Number: [id number] Date of Birth/Sex: Treating RN: September 16, 1955 (65 y.o. [name] Primary Care Provider: SLA TO [name] HN Other Clinician: Referring Provider: Treating Provider/Extender: [name] SLA TO Cathrine Muster, JO HN Weeks in Treatment: 0 Diagnosis Coding ICD-10 Codes Code Description I87.2 Venous insufficiency (chronic) (peripheral) I89.0 Lymphedema, not elsewhere classified L97.812 Non-pressure chronic ulcer of other part of right lower leg with fat layer exposed I50.42 Chronic combined systolic (congestive) and diastolic (congestive) heart failure I25.10 Atherosclerotic heart disease of native coronary artery without angina pectoris I48.0 Paroxysmal atrial fibrillation Z79.01 Long term (current) use of anticoagulants Facility Procedures CPT4 Code: [id number] Description: [place] VISIT-LEV 3 EST PT Modifier: 25 Quantity: 1 CPT4 Code: [id number] Description: (Facility Use Only) [id number] - [place] COMPRS LWR RT LEG Modifier: Quantity: 1 Physician Procedures : CPT4 Code Description Modifier [id number] WC PHYS LEVEL 3 NEW PT 1 ICD-10 Diagnosis Description I87.2 Venous insufficiency (chronic) (peripheral) I89.0 Lymphedema, not elsewhere classified L97.812 Non-pressure chronic ulcer of other part of right lower  leg with fat layer exposed I50.42 Chronic combined systolic (congestive) and diastolic (congestive) heart failure Quantity: Electronic Signature(s) Signed: 10/09/2020 2:32:53 PM By: [name] PA-C Entered By: [name]  on 10/09/2020 14:32:52

## 2020-10-09 NOTE — Progress Notes (Signed)
Charles Gray, [place] ([id number]) Visit Report for 10/09/2020 Abuse/Suicide Risk Screen Details Patient Name: Date of Service: Charles Gray 10/09/2020 1:15 PM Medical Record Number: [id number] Patient Account Number: [id number] Date of Birth/Sex: Treating RN: 1955-05-18 (65 y.o. [name] Primary Care Dierdre Mccalip: SLA TO [name] HN Other Clinician: Referring Donya Tomaro: Treating Vance Belcourt/Extender: Charles Gray SLA TO [name], JO HN Weeks in Treatment: 0 Abuse/Suicide Risk Screen Items Answer ABUSE RISK SCREEN: Has anyone close to you tried to hurt or harm you recentlyo No Do you feel uncomfortable with anyone in your familyo No Has anyone forced you do things that you didnt want to doo No Electronic Signature(s) Signed: 10/09/2020 5:38:09 PM By: Lorrin Jackson Entered By: Lorrin Jackson on 10/09/2020 13:40:22 -------------------------------------------------------------------------------- Activities of Daily Living Details Patient Name: Date of Service: Charles Gray 10/09/2020 1:15 PM Medical Record Number: [id number] Patient Account Number: [id number] Date of Birth/Sex: Treating RN: Sep 18, 1955 (65 y.o. [name] Primary Care Alejandro Adcox: SLA TO [name] HN Other Clinician: Referring Rigo Letts: Treating Jari Carollo/Extender: Charles Gray SLA TO [name], JO HN Weeks in Treatment: 0 Activities of Daily Living Items Answer Activities of Daily Living (Please select one for each item) Drive Automobile Not Able T Medications ake Completely Able Use T elephone Completely Able Care for Appearance Need Assistance Use T oilet Completely Able Bath / Shower Need Assistance Dress Self Need Assistance Feed Self Need Assistance Walk Need Assistance Get In / Out Bed Completely [place] for Self Need Assistance Electronic Signature(s) Signed: 10/09/2020 5:38:09 PM By: Lorrin Jackson Entered By:  Lorrin Jackson on 10/09/2020 13:41:19 -------------------------------------------------------------------------------- Education Screening Details Patient Name: Date of Service: Charles Gray 10/09/2020 1:15 PM Medical Record Number: [id number] Patient Account Number: [id number] Date of Birth/Sex: Treating RN: 16-Jun-1955 (65 y.o. [name] Primary Care [place]: SLA TO [name] HN Other Clinician: Referring Charles Gray: Treating Charles Gray/Extender: Charles Gray SLA TO [name] HN Weeks in Treatment: 0 Primary Learner Assessed: Patient Learning Preferences/Education Level/Primary Language Learning Preference: Explanation, Demonstration, Printed Material Highest Education Level: High School Preferred Language: English Cognitive Barrier Language Barrier: No Translator Needed: No Memory Deficit: No Emotional Barrier: No Cultural/Religious Beliefs Affecting Medical Care: No Physical Barrier Impaired Vision: Yes Glasses Impaired Hearing: No Decreased Hand dexterity: No Knowledge/Comprehension Knowledge Level: Medium Comprehension Level: Medium Ability to understand written instructions: Medium Ability to understand verbal instructions: High Motivation Anxiety Level: Calm Cooperation: Cooperative Education Importance: Acknowledges Need Interest in Health Problems: Asks Questions Perception: Coherent Willingness to Engage in Self-Management Medium Activities: Readiness to Engage in Self-Management Medium Activities: Electronic Signature(s) Signed: 10/09/2020 5:38:09 PM By: Lorrin Jackson Entered By: Lorrin Jackson on 10/09/2020 13:41:56 -------------------------------------------------------------------------------- Fall Risk Assessment Details Patient Name: Date of Service: TA Eligah Gray, [name] 10/09/2020 1:15 PM Medical Record Number: [id number] Patient Account Number: [id number] Date of Birth/Sex: Treating RN: 1956/01/20 (65 y.o. [name] Primary Care  Whitman Meinhardt: SLA TO [name] HN Other Clinician: Referring Shakeia Krus: Treating Charles Gray/Extender: Charles Gray SLA TO Cathrine Muster, JO HN Weeks in Treatment: 0 Fall Risk Assessment Items Have you had 2 or more falls in the last 12 monthso 0 No Have you had any fall that resulted in injury in the last 12 monthso 0 No FALLS RISK SCREEN History of falling - immediate or within 3 months 0 No Secondary diagnosis (Do you have 2 or more medical diagnoseso) 0 No Ambulatory aid None/bed  rest/wheelchair/nurse 0 No Crutches/cane/walker 15 Yes Furniture 0 No Intravenous therapy Access/Saline/Heparin Lock 0 No Gait/Transferring Normal/ bed rest/ wheelchair 0 No Weak (short steps with or without shuffle, stooped but able to lift head while walking, may seek 10 Yes support from furniture) Impaired (short steps with shuffle, may have difficulty arising from chair, head down, impaired 0 No balance) Mental Status Oriented to own ability 0 Yes Electronic Signature(s) Signed: 10/09/2020 5:38:09 PM By: Lorrin Jackson Entered By: Lorrin Jackson on 10/09/2020 13:42:44 -------------------------------------------------------------------------------- Foot Assessment Details Patient Name: Date of Service: Charles Gray 10/09/2020 1:15 PM Medical Record Number: [id number] Patient Account Number: [id number] Date of Birth/Sex: Treating RN: 05-09-1955 (65 y.o. [name] Primary Care Jenella Craigie: SLA TO [name] HN Other Clinician: Referring Charles Gray: Treating Kacey Dysert/Extender: Charles Gray SLA TO Cathrine Muster, JO HN Weeks in Treatment: 0 Foot Assessment Items Site Locations + = Sensation present, - = Sensation absent, C = Callus, U = Ulcer R = Redness, W = Warmth, M = Maceration, PU = Pre-ulcerative lesion F = Fissure, S = Swelling, D = Dryness Assessment Right: Left: Other Deformity: No No Prior Foot Ulcer: No No Prior Amputation: No No Charcot Joint: No No Ambulatory Status: Ambulatory With  Help Assistance Device: Walker Gait: Steady Electronic Signature(s) Signed: 10/09/2020 5:38:09 PM By: Lorrin Jackson Entered By: Lorrin Jackson on 10/09/2020 13:45:46 -------------------------------------------------------------------------------- Nutrition Risk Screening Details Patient Name: Date of Service: Charles Gray 10/09/2020 1:15 PM Medical Record Number: [id number] Patient Account Number: [id number] Date of Birth/Sex: Treating RN: 1955/07/26 (65 y.o. [name] Primary Care Christalynn [place]: SLA TO [name] HN Other Clinician: Referring Earnest Mcgillis: Treating Shivali Quackenbush/Extender: Charles Gray SLA TO [name], JO HN Weeks in Treatment: 0 Height (in): 82 Weight (lbs): 373 Body Mass Index (BMI): 39 Nutrition Risk Screening Items Score Screening NUTRITION RISK SCREEN: I have an illness or condition that made me change the kind and/or amount of food I eat 0 No I eat fewer than two meals per day 0 No I eat few fruits and vegetables, or milk products 0 No I have three or more drinks of beer, liquor or wine almost every day 0 No I have tooth or mouth problems that make it hard for me to eat 0 No I don't always have enough money to buy the food I need 0 No I eat alone most of the time 0 No I take three or more different prescribed or over-the-counter drugs a day 1 Yes Without wanting to, I have lost or gained 10 pounds in the last six months 0 No I am not always physically able to shop, cook and/or feed myself 2 Yes Nutrition Protocols Good Risk Protocol Moderate Risk Protocol 0 Provide education on nutrition High Risk Proctocol Risk Level: Moderate Risk Score: 3 Electronic Signature(s) Signed: 10/09/2020 5:38:09 PM By: Lorrin Jackson Entered By: Lorrin Jackson on 10/09/2020 13:43:13

## 2020-10-11 ENCOUNTER — Telehealth (HOSPITAL_COMMUNITY): Payer: Self-pay

## 2020-10-11 ENCOUNTER — Encounter (HOSPITAL_COMMUNITY): Payer: Self-pay

## 2020-10-11 NOTE — Telephone Encounter (Signed)
Completed set up for transportation to [facility] for a 2:30 appointment. Pick up from home is TBD, they will call patient with same.  He will then leave wound care appointment and go directly to [facility] for a lab visit.   Charles Gray is aware to contact [facility] transport upon completion at the wound care center and request for transport to the Heart Failure clinic.   Call complete.

## 2020-10-12 ENCOUNTER — Emergency Department (HOSPITAL_COMMUNITY): Payer: Medicare HMO

## 2020-10-12 ENCOUNTER — Emergency Department (HOSPITAL_COMMUNITY)
Admission: EM | Admit: 2020-10-12 | Discharge: 2020-10-12 | Disposition: A | Discharge status: Home / Self Care | Payer: Medicare HMO | Attending: Emergency Medicine | Admitting: Emergency Medicine

## 2020-10-12 DIAGNOSIS — I6523 Occlusion and stenosis of bilateral carotid arteries: Secondary | ICD-10-CM

## 2020-10-12 DIAGNOSIS — I5033 Acute on chronic diastolic (congestive) heart failure: Secondary | ICD-10-CM | POA: Diagnosis not present

## 2020-10-12 DIAGNOSIS — R519 Headache, unspecified: Secondary | ICD-10-CM | POA: Diagnosis present

## 2020-10-12 DIAGNOSIS — I110 Hypertensive heart disease with heart failure: Secondary | ICD-10-CM | POA: Insufficient documentation

## 2020-10-12 DIAGNOSIS — R0602 Shortness of breath: Secondary | ICD-10-CM | POA: Diagnosis not present

## 2020-10-12 DIAGNOSIS — R2243 Localized swelling, mass and lump, lower limb, bilateral: Secondary | ICD-10-CM | POA: Insufficient documentation

## 2020-10-12 LAB — COMPREHENSIVE METABOLIC PANEL
ALT: 21 U/L (ref 0–44)
AST: 31 U/L (ref 15–41)
Albumin: 3.5 g/dL (ref 3.5–5.0)
Alkaline Phosphatase: 70 U/L (ref 38–126)
Anion gap: 11 (ref 5–15)
BUN: 11 mg/dL (ref 8–23)
CO2: 34 mmol/L — ABNORMAL HIGH (ref 22–32)
Calcium: 9.1 mg/dL (ref 8.9–10.3)
Chloride: 92 mmol/L — ABNORMAL LOW (ref 98–111)
Creatinine, Ser: 0.97 mg/dL (ref 0.61–1.24)
GFR, Estimated: >60 mL/min (ref >60)
Glucose, Bld: 133 mg/dL — ABNORMAL HIGH (ref 70–99)
Potassium: 2.8 mmol/L — ABNORMAL LOW (ref 3.5–5.1)
Sodium: 137 mmol/L (ref 135–145)
Total Bilirubin: 0.9 mg/dL (ref 0.3–1.2)
Total Protein: 7.2 g/dL (ref 6.5–8.1)

## 2020-10-12 LAB — CBC
HCT: 37.2 % — ABNORMAL LOW (ref 39.0–52.0)
Hemoglobin: 11.1 g/dL — ABNORMAL LOW (ref 13.0–17.0)
MCH: 23.5 pg — ABNORMAL LOW (ref 26.0–34.0)
MCHC: 29.8 g/dL — ABNORMAL LOW (ref 30.0–36.0)
MCV: 78.8 fL — ABNORMAL LOW (ref 80.0–100.0)
Platelets: 141 10*3/uL — ABNORMAL LOW (ref 150–400)
RBC: 4.72 MIL/uL (ref 4.22–5.81)
RDW: 16.3 % — ABNORMAL HIGH (ref 11.5–15.5)
WBC: 6.7 10*3/uL (ref 4.0–10.5)
nRBC: 0 % (ref 0.0–0.2)

## 2020-10-12 LAB — URINALYSIS, ROUTINE W REFLEX MICROSCOPIC
Bilirubin Urine: NEGATIVE
Glucose, UA: NEGATIVE mg/dL
Hgb urine dipstick: NEGATIVE
Ketones, ur: NEGATIVE mg/dL
Leukocytes,Ua: NEGATIVE
Nitrite: NEGATIVE
Protein, ur: NEGATIVE mg/dL
Specific Gravity, Urine: 1.008 (ref 1.005–1.030)
pH: 7 (ref 5.0–8.0)

## 2020-10-12 LAB — BRAIN NATRIURETIC PEPTIDE: B Natriuretic Peptide: 33.4 pg/mL (ref 0.0–100.0)

## 2020-10-12 LAB — TROPONIN I (HIGH SENSITIVITY)
Troponin I (High Sensitivity): 5 ng/L (ref <18)
Troponin I (High Sensitivity): 4 ng/L (ref <18)

## 2020-10-12 LAB — PROTIME-INR
INR: 1.6 — ABNORMAL HIGH (ref 0.8–1.2)
Prothrombin Time: 18.7 seconds — ABNORMAL HIGH (ref 11.4–15.2)

## 2020-10-12 MED ORDER — PROCHLORPERAZINE EDISYLATE 10 MG/2ML IJ SOLN
10.0000 mg | Freq: Once | INTRAMUSCULAR | Status: AC
  Administered 2020-10-12: 10 mg via INTRAVENOUS
  Filled 2020-10-12: qty 2

## 2020-10-12 MED ORDER — MORPHINE SULFATE (PF) 4 MG/ML IV SOLN
4.0000 mg | Freq: Once | INTRAVENOUS | Status: AC
Start: 2020-10-12 — End: 2020-10-12
  Administered 2020-10-12: 4 mg via INTRAVENOUS
  Filled 2020-10-12: qty 1

## 2020-10-12 MED ORDER — IOHEXOL 350 MG/ML SOLN
75.0000 mL | Freq: Once | INTRAVENOUS | Status: AC | PRN
  Administered 2020-10-12: 75 mL via INTRAVENOUS

## 2020-10-12 MED ORDER — DIPHENHYDRAMINE HCL 50 MG/ML IJ SOLN
25.0000 mg | Freq: Once | INTRAMUSCULAR | Status: AC
  Administered 2020-10-12: 25 mg via INTRAVENOUS
  Filled 2020-10-12: qty 1

## 2020-10-12 MED ORDER — ONDANSETRON HCL 4 MG/2ML IJ SOLN
4.0000 mg | Freq: Once | INTRAMUSCULAR | Status: AC
  Administered 2020-10-12: 4 mg via INTRAVENOUS
  Filled 2020-10-12: qty 2

## 2020-10-12 NOTE — ED Notes (Signed)
Report given to [place], [place]

## 2020-10-12 NOTE — ED Provider Notes (Signed)
[facility] Emergency Department Provider Note MRN:  [id number]  Arrival date & time: 10/12/20     Chief Complaint   Headache History of Present Illness   Charles Gray is a 65 y.o. year-old male with a history of heart failure presenting to the ED with chief complaint of headache.  Sudden onset headache 2 days ago, located diffusely, pain is constant, throbbing, no exacerbating or alleviating factors.  Also experiencing shortness of breath and increased lower extremity edema over the past several days.  Denies any chest pain, no abdominal pain.  No fever or cough.  Review of Systems  A complete 10 system review of systems was obtained and all systems are negative except as noted in the HPI and PMH.   Patient's Health History    Past Medical History:  Diagnosis Date   Acute on chronic congestive heart failure (HCC)    Acute on chronic diastolic CHF (congestive heart failure) ([place]) 08/23/2019   Acute on chronic right-sided heart failure ([place]) 08/06/2019   Acute respiratory failure with hypoxia (HCC)    Anemia of chronic disease    Atrial fibrillation ([place]) 12/23/2019   Atrial tachycardia ([place]) 08/12/2019   Benign essential HTN    Chest pain 08/23/2019   Chronic anticoagulation 12/26/2019   Chronic heart failure with preserved EF 05/30/2019   Echo 07/2019: EF 55-60, GR 1 DD   Chronic pain syndrome    Cirrhosis of liver without ascites ([place])    Class 2 severe obesity due to excess calories with serious comorbidity and body mass index (BMI) of 39.0 to 39.9 in adult ([place]) 05/30/2019   Debility 03/13/2019   Drug induced constipation    Dyspnea    Fatty liver disease, nonalcoholic    GIB (gastrointestinal bleeding) 03/04/2019   Heart failure, systolic, acute ([place]) 0/34/9179   History of pulmonary embolism 08/12/2019   Hyperlipidemia 01/24/2019   Hypertension 01/24/2019   Hypoalbuminemia due to protein-calorie malnutrition (HCC)    Hypokalemia    Hyponatremia     Hypotension due to drugs    Insomnia 01/24/2019   Lactic acidosis 08/23/2019   Lumbar disc herniation 01/24/2019   Mixed hyperlipidemia 01/24/2019   Morbid obesity with BMI of 40.0-44.9, adult ([place]) 05/30/2019   Normocytic anemia 06/08/2019   Occasional tremors 08/12/2019   OSA (obstructive sleep apnea) 08/12/2019   Palpitations 12/26/2019   Peripheral edema    Personal history of DVT (deep vein thrombosis) 12/26/2019   Portal hypertensive gastropathy ([place]) 03/06/2019   Seen on EGD 03/06/2019   Pulmonary emboli ([place]) 06/08/2019   Pulmonary embolism ([place]) 06/08/2019   Redness and swelling of lower leg 08/23/2019   Shock ([place])    Thrombocytopenia ([place])     Past Surgical History:  Procedure Laterality Date   ESOPHAGOGASTRODUODENOSCOPY (EGD) WITH PROPOFOL N/A 03/05/2019   Procedure: ESOPHAGOGASTRODUODENOSCOPY (EGD) WITH PROPOFOL;  Surgeon: Doran Stabler, MD;  Location: [facility];  Service: Gastroenterology;  Laterality: N/A;  Large Blood Clot Removed   ESOPHAGOGASTRODUODENOSCOPY (EGD) WITH PROPOFOL N/A 03/06/2019   Procedure: ESOPHAGOGASTRODUODENOSCOPY (EGD) WITH PROPOFOL;  Surgeon: Thornton Park, MD;  Location: [facility];  Service: Gastroenterology;  Laterality: N/A;   HEMORROIDECTOMY     RIGHT HEART CATH N/A 08/09/2019   Procedure: RIGHT HEART CATH;  Surgeon: Larey Dresser, MD;  Location: [facility];  Service: Cardiovascular;  Laterality: N/A;   RIGHT/LEFT HEART CATH AND CORONARY ANGIOGRAPHY N/A 05/10/2020   Procedure: RIGHT/LEFT HEART CATH AND CORONARY ANGIOGRAPHY;  Surgeon: Loralie Champagne  S, MD;  Location: [facility];  Service: Cardiovascular;  Laterality: N/A;    Family History  Problem Relation Age of Onset   Hyperlipidemia Mother    Hypertension Mother    Stroke Mother    CAD Maternal Grandmother    CAD Maternal Grandfather     Social History   Socioeconomic History   Marital status: Single    Spouse name: Not on file   Number of children: Not on  file   Years of education: Not on file   Highest education level: Not on file  Occupational History   Not on file  Tobacco Use   Smoking status: Never   Smokeless tobacco: Never  Vaping Use   Vaping Use: Never used  Substance and Sexual Activity   Alcohol use: Not Currently   Drug use: Not Currently    Types: Marijuana    Comment: Smoked for 30 years   Sexual activity: Not on file  Other Topics Concern   Not on file  Social History Narrative   Not on file   Social Determinants of Health   Financial Resource Strain: Medium Risk   Difficulty of Paying Living Expenses: Somewhat hard  Food Insecurity: No Food Insecurity   Worried About Running Out of Food in the Last Year: Never true   Ran Out of Food in the Last Year: Never true  Transportation Needs: Unmet Transportation Needs   Lack of Transportation (Medical): Yes   Lack of Transportation (Non-Medical): No  Physical Activity: Not on file  Stress: Not on file  Social Connections: Not on file  Intimate Partner Violence: Not on file     Physical Exam   Vitals:   10/12/20 0615 10/12/20 0630  BP: 107/69 113/64  Pulse: 68 65  Resp: 11 11  Temp:    SpO2: 98% 92%    CONSTITUTIONAL: Chronically ill-appearing, NAD NEURO:  Alert and oriented x 3, normal and symmetric strength and sensation, normal coordination, normal speech EYES:  eyes equal and reactive ENT/NECK:  no LAD, no JVD CARDIO: Regular rate, well-perfused, normal S1 and S2 PULM:  CTAB no wheezing or rhonchi GI/GU:  normal bowel sounds, non-distended, non-tender MSK/SPINE:  No gross deformities, 2+ pitting edema to the bilateral lower extremities SKIN:  no rash, atraumatic PSYCH:  Appropriate speech and behavior  *Additional and/or pertinent findings included in MDM below  Diagnostic and Interventional Summary    EKG Interpretation  Date/Time:  Saturday October 12 2020 06:15:13 EDT Ventricular Rate:  71 PR Interval:  177 QRS Duration: 118 QT  Interval:  425 QTC Calculation: 462 R Axis:   -66 Text Interpretation: Sinus arrhythmia Left anterior fascicular block Low voltage, precordial leads Confirmed by Gerlene Fee [phone]) on 10/12/2020 7:04:21 AM        Labs Reviewed  CBC - Abnormal; Notable for the following components:      Result Value   Hemoglobin 11.1 (*)    HCT 37.2 (*)    MCV 78.8 (*)    MCH 23.5 (*)    MCHC 29.8 (*)    RDW 16.3 (*)    Platelets 141 (*)    All other components within normal limits  COMPREHENSIVE METABOLIC PANEL - Abnormal; Notable for the following components:   Potassium 2.8 (*)    Chloride 92 (*)    CO2 34 (*)    Glucose, Bld 133 (*)    All other components within normal limits  PROTIME-INR - Abnormal; Notable for the following components:  Prothrombin Time 18.7 (*)    INR 1.6 (*)    All other components within normal limits  URINALYSIS, ROUTINE W REFLEX MICROSCOPIC  BRAIN NATRIURETIC PEPTIDE  TROPONIN I (HIGH SENSITIVITY)  TROPONIN I (HIGH SENSITIVITY)    DG Chest Port 1 View  Final Result    CT Angio Head W or Wo Contrast    (Results Pending)  CT Angio Neck W and/or Wo Contrast    (Results Pending)    Medications  diphenhydrAMINE (BENADRYL) injection 25 mg (25 mg Intravenous Given 10/12/20 0627)  prochlorperazine (COMPAZINE) injection 10 mg (10 mg Intravenous Given 10/12/20 7290)     Procedures  /  Critical Care Procedures  ED Course and Medical Decision Making  I have reviewed the triage vital signs, the nursing notes, and pertinent available records from the EMR.  Listed above are laboratory and imaging tests that I personally ordered, reviewed, and interpreted and then considered in my medical decision making (see below for details).  Considering subarachnoid hemorrhage given the sudden onset persistent headache.  Patient does not normally experience headaches.  Will obtain a CTA imaging.  Patient also may be experiencing a CHF exacerbation given the shortness of breath and  lower extremity edema.  Vital signs are reassuring, no hypoxia, no increased work of breathing.  Awaiting labs, CT.     Signed out to default provider, still awaiting labs, CT.  Candidate for discharge if work-up reassuring.  Barth Kirks. Sedonia Small, [place] [email or web address]  Final Clinical Impressions(s) / ED Diagnoses     ICD-10-CM   1. Nonintractable headache, unspecified chronicity pattern, unspecified headache type  R51.9       ED Discharge Orders     None        Discharge Instructions Discussed with and Provided to Patient:   Discharge Instructions   None       Maudie Flakes, MD 10/12/20 [phone]

## 2020-10-12 NOTE — ED Provider Notes (Signed)
Pt signed out by Dr. Sedonia Small who ordered CTA head and neck.  IMPRESSION:  1. Negative for large vessel occlusion. With stable and normal for  age non contrast CT appearance of the brain.     2. Minimal extracranial atherosclerosis, but intracranial  atherosclerosis affecting mostly 2nd and 3rd order branches, notable  for:  - Severe stenosis Left MCA M3 branch.  - Moderate stenosis Right ACA A1 segment.  - Moderate stenosis Right PCA P2 segment.     3. Chronic cervical spine degeneration.      Pt is feeling better.  He is referred to neurology.  Return if worse.      Isla Pence, MD 10/12/20 [phone]

## 2020-10-12 NOTE — ED Triage Notes (Signed)
Pt bib EMS from home for c/o throbbing headache, back pain, and urinary incontinence x3 days. Newly increased dose of torsemide. Endorses weaknesses but able to ambulate with EMS. Endorses nausea and dizziness without vomiting.   Hx CHF, a fib.  EMS vitals: 122/84 95% RA HR 74 with noted PVCs  RR 16

## 2020-10-13 ENCOUNTER — Encounter (HOSPITAL_COMMUNITY): Payer: Self-pay

## 2020-10-15 NOTE — Progress Notes (Addendum)
Charles Gray, Charles Gray ([id number]) Visit Report for 10/09/2020 Allergy List Details Patient Name: Date of Service: Charles Gray 10/09/2020 1:15 PM Medical Record Number: [id number] Patient Account Number: [id number] Date of Birth/Sex: Treating RN: 1955/08/22 (65 y.o. [name] Primary Care Skylinn Vialpando: SLA TO [name] HN Other Clinician: Referring Yarah Fuente: Treating Yulian Gosney/Extender: [name] SLA TO [name], JO HN Weeks in Treatment: 0 Allergies Active Allergies penicillin Allergy Notes Electronic Signature(s) Signed: 10/09/2020 5:38:09 PM By: Lorrin Jackson Entered By: Lorrin Jackson on 10/09/2020 13:31:20 -------------------------------------------------------------------------------- Arrival Information Details Patient Name: Date of Service: Charles Gray 10/09/2020 1:15 PM Medical Record Number: [id number] Patient Account Number: [id number] Date of Birth/Sex: Treating RN: 02/06/1956 (65 y.o. [name] Primary Care Alfred Eckley: SLA TO [name] HN Other Clinician: Referring Dezmin Kittelson: Treating Twilla Khouri/Extender: [name] SLA TO [name] HN Weeks in Treatment: 0 Visit Information Patient Arrived: Wheel Chair Arrival Time: 13:21 Transfer Assistance: Manual Patient Identification Verified: Yes Secondary Verification Process Completed: Yes Patient Requires Transmission-Based Precautions: No Patient Has Alerts: Yes Patient Alerts: Patient on Blood Thinner ABI's: R=1.10 L=1.29 Electronic Signature(s) Signed: 10/09/2020 5:38:09 PM By: Lorrin Jackson Entered By: Lorrin Jackson on 10/09/2020 13:49:31 -------------------------------------------------------------------------------- Clinic Level of Care Assessment Details Patient Name: Date of Service: Charles Gray 10/09/2020 1:15 PM Medical Record Number: [id number] Patient Account Number: [id number] Date of Birth/Sex: Treating RN: Dec 11, 1955 (65 y.o. [name] Primary Care Caasi Giglia: SLA TO [name] HN Other Clinician: Referring Jadaya Sommerfield: Treating Betania Dizon/Extender: [name] SLA TO [name], JO HN Weeks in Treatment: 0 Clinic Level of Care Assessment Items TOOL 1 Quantity Score []  - 0 Use when EandM and Procedure is performed on INITIAL visit ASSESSMENTS - Nursing Assessment / Reassessment X- 1 20 General Physical Exam (combine w/ comprehensive assessment (listed just below) when performed on new pt. evals) X- 1 25 Comprehensive Assessment (HX, ROS, Risk Assessments, Wounds Hx, etc.) ASSESSMENTS - Wound and Skin Assessment / Reassessment []  - 0 Dermatologic / Skin Assessment (not related to wound area) ASSESSMENTS - Ostomy and/or Continence Assessment and Care []  - 0 Incontinence Assessment and Management []  - 0 Ostomy Care Assessment and Management (repouching, etc.) PROCESS - Coordination of Care X - Simple Patient / Family Education for ongoing care 1 15 []  - 0 Complex (extensive) Patient / Family Education for ongoing care X- 1 10 Staff obtains Programmer, systems, Records, T Results / Process Orders est []  - 0 Staff telephones HHA, Nursing Homes / Clarify orders / etc []  - 0 Routine Transfer to another Facility (non-emergent condition) []  - 0 Routine Hospital Admission (non-emergent condition) X- 1 15 New Admissions / Biomedical engineer / Ordering NPWT Apligraf, etc. , []  - 0 Emergency Hospital Admission (emergent condition) PROCESS - Special Needs []  - 0 Pediatric / Minor Patient Management []  - 0 Isolation Patient Management []  - 0 Hearing / Language / Visual special needs []  - 0 Assessment of Community assistance (transportation, D/C planning, etc.) []  - 0 Additional assistance / Altered mentation []  - 0 Support Surface(s) Assessment (bed, cushion, seat, etc.) INTERVENTIONS - Miscellaneous []  - 0 External ear exam []  - 0 Patient Transfer (multiple staff / Civil Service fast streamer / Similar devices) []  - 0 Simple Staple / Suture removal (25 or less) []  -  0 Complex Staple / Suture removal (26 or more) []  - 0 Hypo/Hyperglycemic Management (do not check if billed separately) []  - 0 Ankle / Brachial Index (ABI) - do not check if billed  separately Has the patient been seen at the hospital within the last three years: Yes Total Score: 85 Level Of Care: New/Established - Level 3 Electronic Signature(s) Signed: 10/09/2020 5:41:46 PM By: Baruch Gouty RN, BSN Entered By: Baruch Gouty on 10/09/2020 14:19:34 -------------------------------------------------------------------------------- Compression Therapy Details Patient Name: Date of Service: Charles Gray 10/09/2020 1:15 PM Medical Record Number: [id number] Patient Account Number: [id number] Date of Birth/Sex: Treating RN: 12-14-1955 (65 y.o. [name] Primary Care Donte Lenzo: SLA TO [name] HN Other Clinician: Referring Blain Hunsucker: Treating Dolores Mcgovern/Extender: [name] SLA TO Cathrine Muster, JO HN Weeks in Treatment: 0 Compression Therapy Performed for Wound Assessment: Wound #1 Right,Anterior Lower Leg Performed By: Clinician Leane Call, RN Compression Type: Three Layer Post Procedure Diagnosis Same as Pre-procedure Electronic Signature(s) Signed: 10/09/2020 5:41:46 PM By: Baruch Gouty RN, BSN Entered By: Baruch Gouty on 10/09/2020 14:18:55 -------------------------------------------------------------------------------- Compression Therapy Details Patient Name: Date of Service: Charles Gray 10/09/2020 1:15 PM Medical Record Number: [id number] Patient Account Number: [id number] Date of Birth/Sex: Treating RN: [birth year] (65 y.o. [name] Primary Care Beau Vanduzer: SLA TO [name] HN Other Clinician: Referring Davaun Quintela: Treating Alizey Noren/Extender: [name] SLA TO Cathrine Muster, JO HN Weeks in Treatment: 0 Compression Therapy Performed for Wound Assessment: Wound #2 Right,Posterior Lower Leg Performed By: Clinician Leane Call, RN Compression  Type: Three Layer Post Procedure Diagnosis Same as Pre-procedure Electronic Signature(s) Signed: 10/09/2020 5:41:46 PM By: Baruch Gouty RN, BSN Entered By: Baruch Gouty on 10/09/2020 14:18:55 -------------------------------------------------------------------------------- Encounter Discharge Information Details Patient Name: Date of Service: Charles Gray 10/09/2020 1:15 PM Medical Record Number: [id number] Patient Account Number: [id number] Date of Birth/Sex: Treating RN: 1955/09/17 (65 y.o. Gilman Buttner Primary Care Lizzete Gough: SLA TO [name] HN Other Clinician: Referring Ebubechukwu Jedlicka: Treating Marlyn Rabine/Extender: [name] SLA TO Cathrine Muster, JO HN Weeks in Treatment: 0 Encounter Discharge Information Items Discharge Condition: Stable Ambulatory Status: Ambulatory Discharge Destination: Home Transportation: Private Auto Accompanied By: alone Schedule Follow-up Appointment: No Clinical Summary of Care: Patient Declined Electronic Signature(s) Signed: 10/15/2020 6:17:43 PM By: Leane Call Entered By: Leane Call on 10/09/2020 16:47:38 -------------------------------------------------------------------------------- Lower Extremity Assessment Details Patient Name: Date of Service: Charles Gray 10/09/2020 1:15 PM Medical Record Number: [id number] Patient Account Number: [id number] Date of Birth/Sex: Treating RN: [birth year] (65 y.o. [name] Primary Care Ellia Knowlton: SLA TO [name] HN Other Clinician: Referring Chasitie Passey: Treating Chanz Cahall/Extender: [name] SLA TO Cathrine Muster, JO HN Weeks in Treatment: 0 Edema Assessment Assessed: [Left: Yes] [Right: Yes] Edema: [Left: Yes] [Right: Yes] Calf Left: Right: Point of Measurement: 39 cm From Medial Instep 47 cm 47.5 cm Ankle Left: Right: Point of Measurement: 12 cm From Medial Instep 31.5 cm 31.5 cm Knee To Floor Left: Right: From Medial Instep 49 cm 49 cm Vascular  Assessment Pulses: Dorsalis Pedis Palpable: [Left:Yes] [Right:Yes] Notes ABI's 01/2020 R=1.10 L=1.29 Electronic Signature(s) Signed: 10/09/2020 5:38:09 PM By: Lorrin Jackson Entered By: Lorrin Jackson on 10/09/2020 13:48:53 -------------------------------------------------------------------------------- Multi-Disciplinary Care Plan Details Patient Name: Date of Service: Charles Gray 10/09/2020 1:15 PM Medical Record Number: [id number] Patient Account Number: [id number] Date of Birth/Sex: Treating RN: [birth year] (64 y.o. [name] Primary Care Aveline Daus: SLA TO [name] HN Other Clinician: Referring Amiee Wiley: Treating Aneesa Romey/Extender: Valli Glance TO [name] HN Weeks in Treatment: 0 Multidisciplinary Care Plan reviewed with physician Active Inactive Electronic Signature(s) Signed: 01/09/2021 3:07:54 PM By: Baruch Gouty RN, BSN Previous Signature: 10/09/2020 5:41:46 PM  Version By: Baruch Gouty RN, BSN Entered By: Baruch Gouty on 12/05/2020 11:47:47 -------------------------------------------------------------------------------- Pain Assessment Details Patient Name: Date of Service: Charles Gray 10/09/2020 1:15 PM Medical Record Number: [id number] Patient Account Number: [id number] Date of Birth/Sex: Treating RN: 1955/04/22 (65 y.o. [name] Primary Care Frona Yost: SLA TO [name] HN Other Clinician: Referring Luc Shammas: Treating Anntionette Madkins/Extender: [name] SLA TO [name], JO HN Weeks in Treatment: 0 Active Problems Location of Pain Severity and Description of Pain Patient Has Paino Yes Site Locations Pain Location: Pain in Ulcers With Dressing Change: Yes Duration of the Pain. Constant / Intermittento Intermittent Rate the pain. Current Pain Level: 6 Character of Pain Describe the Pain: Stabbing, Throbbing Pain Management and Medication Current Pain Management: Medication: Yes Cold Application: No Rest: Yes Massage:  No Activity: No T.E.N.S.: No Heat Application: No Leg drop or elevation: No Is the Current Pain Management Adequate: Inadequate How does your wound impact your activities of daily livingo Sleep: Yes Bathing: No Appetite: No Relationship With Others: No Bladder Continence: No Emotions: No Bowel Continence: No Work: No Toileting: No Drive: No Dressing: No Hobbies: No Electronic Signature(s) Signed: 10/09/2020 5:38:09 PM By: Lorrin Jackson Entered By: Lorrin Jackson on 10/09/2020 13:44:07 -------------------------------------------------------------------------------- Patient/Caregiver Education Details Patient Name: Date of Service: Charles Gray 6/29/2022andnbsp1:15 PM Medical Record Number: [id number] Patient Account Number: [id number] Date of Birth/Gender: Treating RN: 1956/03/20 (65 y.o. [name] Primary Care Physician: SLA TO [name] HN Other Clinician: Referring Physician: Treating Physician/Extender: [name] SLA TO [name] HN Weeks in Treatment: 0 Education Assessment Education Provided To: Patient Education Topics Provided Venous: Handouts: Controlling Swelling with Multilayered Compression Wraps Methods: Explain/Verbal, Printed Responses: Reinforcements needed, State content correctly [place]: o Handouts: Welcome T [place] o Methods: Explain/Verbal, Printed Responses: Reinforcements needed, State content correctly Electronic Signature(s) Signed: 10/09/2020 5:41:46 PM By: Baruch Gouty RN, BSN Entered By: Baruch Gouty on 10/09/2020 14:17:39 -------------------------------------------------------------------------------- Wound Assessment Details Patient Name: Date of Service: TA Esaw Dace Gray 10/09/2020 1:15 PM Medical Record Number: [id number] Patient Account Number: [id number] Date of Birth/Sex: Treating RN: [birth year] (65 y.o. [name] Primary Care Elyssa Pendelton: SLA TO [name]  HN Other Clinician: Referring Chauncey Bruno: Treating Akayla Brass/Extender: [name] SLA TO [name], JO HN Weeks in Treatment: 0 Wound Status Wound Number: 1 Primary Venous Leg Ulcer Etiology: Wound Location: Right, Anterior Lower Leg Secondary Lymphedema Wounding Event: Gradually Appeared Etiology: Date Acquired: 08/19/2020 Wound Open Weeks Of Treatment: 0 Status: Clustered Wound: No Comorbid Anemia, Chronic Obstructive Pulmonary Disease (COPD), Sleep History: Apnea, Angina, Congestive Heart Failure, Deep Vein Thrombosis, Hypertension, Neuropathy Photos Wound Measurements Length: (cm) 0.4 Width: (cm) 0.4 Depth: (cm) 0.1 Area: (cm) 0.126 Volume: (cm) 0.013 % Reduction in Area: 0% % Reduction in Volume: 0% Epithelialization: None Tunneling: No Undermining: No Wound Description Classification: Full Thickness Without Exposed Support Structures Wound Margin: Distinct, outline attached Exudate Amount: Medium Exudate Type: Serous Exudate Color: amber Foul Odor After Cleansing: No Slough/Fibrino No Wound Bed Granulation Amount: Large (67-100%) Exposed Structure Granulation Quality: Red Fascia Exposed: No Necrotic Amount: None Present (0%) Fat Layer (Subcutaneous Tissue) Exposed: Yes Tendon Exposed: No Muscle Exposed: No Joint Exposed: No Bone Exposed: No Electronic Signature(s) Signed: 10/10/2020 6:39:25 PM By: Lorrin Jackson Signed: 10/11/2020 10:25:49 AM By: Sandre Kitty Previous Signature: 10/09/2020 5:38:09 PM Version By: Lorrin Jackson Entered By: Sandre Kitty on 10/10/2020 14:06:03 -------------------------------------------------------------------------------- Wound Assessment Details Patient Name: Date  of Service: Charles Gray 10/09/2020 1:15 PM Medical Record Number: [id number] Patient Account Number: [id number] Date of Birth/Sex: Treating RN: 11-17-1955 (65 y.o. [name] Primary Care Rodarius Kichline: SLA TO [name] HN Other  Clinician: Referring Rachana Malesky: Treating Tondalaya Perren/Extender: [name] SLA TO Cathrine Muster, JO HN Weeks in Treatment: 0 Wound Status Wound Number: 2 Primary Venous Leg Ulcer Etiology: Wound Location: Right, Posterior Lower Leg Secondary Lymphedema Wounding Event: Gradually Appeared Etiology: Date Acquired: 08/19/2020 Wound Open Weeks Of Treatment: 0 Status: Clustered Wound: No Comorbid Anemia, Chronic Obstructive Pulmonary Disease (COPD), Sleep History: Apnea, Angina, Congestive Heart Failure, Deep Vein Thrombosis, Hypertension, Neuropathy Photos Wound Measurements Length: (cm) 1.7 Width: (cm) 0.7 Depth: (cm) 0.1 Area: (cm) 0.935 Volume: (cm) 0.093 % Reduction in Area: 0% % Reduction in Volume: 0% Epithelialization: None Tunneling: No Undermining: No Wound Description Classification: Full Thickness Without Exposed Support Structures Wound Margin: Distinct, outline attached Exudate Amount: Medium Exudate Type: Serous Exudate Color: amber Foul Odor After Cleansing: No Slough/Fibrino No Wound Bed Granulation Amount: Large (67-100%) Exposed Structure Granulation Quality: Red Fascia Exposed: No Necrotic Amount: None Present (0%) Fat Layer (Subcutaneous Tissue) Exposed: Yes Tendon Exposed: No Muscle Exposed: No Joint Exposed: No Bone Exposed: No Electronic Signature(s) Signed: 10/10/2020 6:39:25 PM By: Lorrin Jackson Signed: 10/11/2020 10:25:49 AM By: Sandre Kitty Previous Signature: 10/09/2020 5:38:09 PM Version By: Lorrin Jackson Entered By: Sandre Kitty on 10/10/2020 14:07:10 -------------------------------------------------------------------------------- Vitals Details Patient Name: Date of Service: [name], [name] 10/09/2020 1:15 PM Medical Record Number: [id number] Patient Account Number: [id number] Date of Birth/Sex: Treating RN: 11-20-1955 (65 y.o. [name] Primary Care Emiliano Welshans: SLA TO [name] HN Other Clinician: Referring  Deane Melick: Treating Maahi Lannan/Extender: [name] SLA TO [name], JO HN Weeks in Treatment: 0 Vital Signs Time Taken: 13:30 Temperature (F): 98.5 Height (in): 82 Pulse (bpm): 79 Source: Stated Respiratory Rate (breaths/min): 20 Weight (lbs): 373 Blood Pressure (mmHg): 114/71 Source: Stated Reference Range: 80 - 120 mg / dl Body Mass Index (BMI): 39 Electronic Signature(s) Signed: 10/09/2020 5:38:09 PM By: Lorrin Jackson Entered By: Lorrin Jackson on 10/09/2020 13:30:53

## 2020-10-16 ENCOUNTER — Ambulatory Visit (HOSPITAL_COMMUNITY)
Admission: RE | Admit: 2020-10-16 | Discharge: 2020-10-16 | Disposition: A | Discharge status: Home / Self Care | Payer: Medicare HMO | Source: Ambulatory Visit | Attending: Cardiology | Admitting: Cardiology

## 2020-10-16 ENCOUNTER — Other Ambulatory Visit: Payer: Self-pay

## 2020-10-16 ENCOUNTER — Other Ambulatory Visit (HOSPITAL_COMMUNITY): Payer: Self-pay

## 2020-10-16 ENCOUNTER — Encounter (HOSPITAL_COMMUNITY): Payer: Self-pay

## 2020-10-16 ENCOUNTER — Other Ambulatory Visit (HOSPITAL_COMMUNITY): Payer: Medicare HMO

## 2020-10-16 ENCOUNTER — Other Ambulatory Visit (HOSPITAL_COMMUNITY): Payer: Self-pay | Admitting: Family Medicine

## 2020-10-16 ENCOUNTER — Other Ambulatory Visit (HOSPITAL_COMMUNITY): Payer: Self-pay | Admitting: Cardiology

## 2020-10-16 ENCOUNTER — Encounter (HOSPITAL_BASED_OUTPATIENT_CLINIC_OR_DEPARTMENT_OTHER): Payer: Medicare HMO | Attending: Physician Assistant | Admitting: Physician Assistant

## 2020-10-16 VITALS — BP 122/70 | HR 79 | Wt 379.0 lb

## 2020-10-16 DIAGNOSIS — I5032 Chronic diastolic (congestive) heart failure: Secondary | ICD-10-CM

## 2020-10-16 DIAGNOSIS — I480 Paroxysmal atrial fibrillation: Secondary | ICD-10-CM

## 2020-10-16 DIAGNOSIS — I5033 Acute on chronic diastolic (congestive) heart failure: Secondary | ICD-10-CM

## 2020-10-16 LAB — BASIC METABOLIC PANEL
Anion gap: 8 (ref 5–15)
BUN: 10 mg/dL (ref 8–23)
CO2: 31 mmol/L (ref 22–32)
Calcium: 8.8 mg/dL — ABNORMAL LOW (ref 8.9–10.3)
Chloride: 98 mmol/L (ref 98–111)
Creatinine, Ser: 0.86 mg/dL (ref 0.61–1.24)
GFR, Estimated: >60 mL/min (ref >60)
Glucose, Bld: 102 mg/dL — ABNORMAL HIGH (ref 70–99)
Potassium: 3.5 mmol/L (ref 3.5–5.1)
Sodium: 137 mmol/L (ref 135–145)

## 2020-10-16 LAB — CBC
HCT: 35.7 % — ABNORMAL LOW (ref 39.0–52.0)
Hemoglobin: 10.6 g/dL — ABNORMAL LOW (ref 13.0–17.0)
MCH: 23.6 pg — ABNORMAL LOW (ref 26.0–34.0)
MCHC: 29.7 g/dL — ABNORMAL LOW (ref 30.0–36.0)
MCV: 79.5 fL — ABNORMAL LOW (ref 80.0–100.0)
Platelets: 153 10*3/uL (ref 150–400)
RBC: 4.49 MIL/uL (ref 4.22–5.81)
RDW: 16.6 % — ABNORMAL HIGH (ref 11.5–15.5)
WBC: 7.3 10*3/uL (ref 4.0–10.5)
nRBC: 0 % (ref 0.0–0.2)

## 2020-10-16 LAB — MAGNESIUM: Magnesium: 2.1 mg/dL (ref 1.7–2.4)

## 2020-10-16 MED ORDER — FUROSEMIDE 10 MG/ML IJ SOLN
80.0000 mg | Freq: Once | INTRAMUSCULAR | Status: AC
Start: 2020-10-16 — End: 2020-10-16
  Administered 2020-10-16: 80 mg via INTRAVENOUS
  Filled 2020-10-16: qty 8

## 2020-10-16 NOTE — Progress Notes (Signed)
Paramedicine Encounter    Patient ID: Charles Gray, male    DOB: 06-Aug-1955, 65 y.o.   MRN: [id number]  Met Charles Gray in clinic today where he was given IV lasix by RN Charles Gray per Dr. Aundra Gray. Charles Gray voided 300cc of urine post IV lasix administration.    Labs were drawn and Charles Gray is scheduled for another dose of IV lasix tomorrow in clinic at 1030 with a 945 cone transport pick up time. I reviewed medications and filled pill box accordingly increasing his potassium dosing per Dr. Aundra Gray to 80MEQ BID. Charles Gray agreed with plan and understood.   Refills: Gabapentin Flomax Potassium   ACTION: Home visit completed

## 2020-10-16 NOTE — Progress Notes (Signed)
Patient urinated 300cc urine.

## 2020-10-16 NOTE — Progress Notes (Signed)
Peripheral IV started in order to administer Lasix per Dr. [name].  He will return tomorrow for another appt to give additional dose.  IV removed before discharge.

## 2020-10-17 ENCOUNTER — Ambulatory Visit (HOSPITAL_COMMUNITY)
Admission: RE | Admit: 2020-10-17 | Discharge: 2020-10-17 | Disposition: A | Discharge status: Home / Self Care | Payer: Medicare HMO | Source: Ambulatory Visit | Attending: Cardiology | Admitting: Cardiology

## 2020-10-17 ENCOUNTER — Encounter (HOSPITAL_COMMUNITY): Payer: Self-pay

## 2020-10-17 DIAGNOSIS — I872 Venous insufficiency (chronic) (peripheral): Secondary | ICD-10-CM

## 2020-10-17 DIAGNOSIS — I5032 Chronic diastolic (congestive) heart failure: Secondary | ICD-10-CM | POA: Diagnosis not present

## 2020-10-17 DIAGNOSIS — I50813 Acute on chronic right heart failure: Secondary | ICD-10-CM

## 2020-10-17 DIAGNOSIS — I5023 Acute on chronic systolic (congestive) heart failure: Secondary | ICD-10-CM

## 2020-10-17 LAB — BASIC METABOLIC PANEL
Anion gap: 9 (ref 5–15)
BUN: 14 mg/dL (ref 8–23)
CO2: 29 mmol/L (ref 22–32)
Calcium: 9 mg/dL (ref 8.9–10.3)
Chloride: 100 mmol/L (ref 98–111)
Creatinine, Ser: 0.86 mg/dL (ref 0.61–1.24)
GFR, Estimated: >60 mL/min (ref >60)
Glucose, Bld: 130 mg/dL — ABNORMAL HIGH (ref 70–99)
Potassium: 3.8 mmol/L (ref 3.5–5.1)
Sodium: 138 mmol/L (ref 135–145)

## 2020-10-17 LAB — BRAIN NATRIURETIC PEPTIDE: B Natriuretic Peptide: 49.9 pg/mL (ref 0.0–100.0)

## 2020-10-17 MED ORDER — FUROSEMIDE 10 MG/ML IJ SOLN
80.0000 mg | Freq: Once | INTRAMUSCULAR | Status: AC
  Administered 2020-10-17: 80 mg via INTRAVENOUS

## 2020-10-17 MED ORDER — POTASSIUM CHLORIDE CRYS ER 20 MEQ PO TBCR
40.0000 meq | EXTENDED_RELEASE_TABLET | Freq: Two times a day (BID) | ORAL | Status: DC
  Administered 2020-10-17: 40 meq via ORAL

## 2020-10-17 NOTE — Progress Notes (Signed)
Peripheral IV started to Right forearm to administer 80 IV LAsix per Dr. [name]. Patient tolerated well. Ordered labwork drawn from same site and sent to Lab. Patient also administered 40 meq of po Potassium.  IV discontinued before patient left clinic.  Patient urinated 400 cc urine while still here.  Transportation contacted, patient picked up and patient transported back to home.

## 2020-10-18 ENCOUNTER — Other Ambulatory Visit (HOSPITAL_COMMUNITY): Payer: Self-pay | Admitting: Cardiology

## 2020-10-18 ENCOUNTER — Other Ambulatory Visit (HOSPITAL_COMMUNITY): Payer: Self-pay

## 2020-10-22 ENCOUNTER — Other Ambulatory Visit (HOSPITAL_COMMUNITY): Payer: Self-pay

## 2020-10-22 MED ORDER — POTASSIUM CHLORIDE CRYS ER 20 MEQ PO TBCR
80.0000 meq | EXTENDED_RELEASE_TABLET | Freq: Two times a day (BID) | ORAL | 11 refills | Status: DC
Start: 2020-10-22 — End: 2020-11-15

## 2020-10-22 NOTE — Progress Notes (Signed)
Paramedicine Encounter    Patient ID: Charles Gray, male    DOB: June 24, 1955, 65 y.o.   MRN: [id number]   Patient Care Team: Enid Skeens., MD as PCP - General (Family Medicine) Berniece Salines, DO as PCP - Cardiology (Cardiology) Jorge Ny, LCSW as Social Worker (Licensed Clinical Social Worker)  Patient Active Problem List   Diagnosis Date Noted   Chronic venous stasis dermatitis of both lower extremities 08/27/2020   Cellulitis of both lower extremities 08/14/2020   Microcytic anemia 08/14/2020   GERD without esophagitis 06/10/2020   Atrial fibrillation with rapid ventricular response ([place]) 03/24/2020   Acute on chronic right heart failure ([place]) 01/27/2020   Acute respiratory failure with hypoxia ([place])    Fatty liver disease, nonalcoholic    Shock ([place])    Personal history of DVT (deep vein thrombosis) 12/26/2019   Chronic anticoagulation 12/26/2019   Palpitations 12/26/2019   AF (paroxysmal atrial fibrillation) ([place]) 12/23/2019   Heart failure, systolic, acute ([place]) 70/96/2836   Acute on chronic congestive heart failure (HCC)    Acute on chronic diastolic CHF (congestive heart failure) ([place]) 08/23/2019   Chest pain 08/23/2019   Lactic acidosis 08/23/2019   History of pulmonary embolism 08/12/2019   Atrial tachycardia ([place]) 08/12/2019   Occasional tremors 08/12/2019   OSA (obstructive sleep apnea) 08/12/2019   Acute on chronic right-sided heart failure ([place]) 08/06/2019   Pulmonary embolism ([place]) 06/08/2019   Normocytic anemia 06/08/2019   Pulmonary emboli ([place]) 06/08/2019   Chronic heart failure with preserved ejection fraction ([place]) 05/30/2019   Class 2 severe obesity due to excess calories with serious comorbidity and body mass index (BMI) of 39.0 to 39.9 in adult ([place]) 05/30/2019   Morbid obesity with BMI of 40.0-44.9, adult ([place]) 05/30/2019   Hyponatremia    Drug induced constipation    Peripheral edema    Hypotension due to drugs    Hypoalbuminemia due to  protein-calorie malnutrition (HCC)    Hypokalemia    Thrombocytopenia (HCC)    Anemia of chronic disease    Cirrhosis of liver without ascites (HCC)    Chronic pain syndrome    Debility 03/13/2019   Portal hypertensive gastropathy ([place]) 03/06/2019   Dyspnea    GIB (gastrointestinal bleeding) 03/04/2019   Mixed hyperlipidemia 01/24/2019   Insomnia 01/24/2019   Hyperlipidemia 01/24/2019   Essential hypertension 01/24/2019   Lumbar disc herniation 01/24/2019    Current Outpatient Medications:    acetaminophen (TYLENOL) 500 MG tablet, Take 1,000 mg by mouth every 6 (six) hours as needed for moderate pain or headache., Disp: , Rfl:    apixaban (ELIQUIS) 5 MG TABS tablet, Take 1 tablet (5 mg total) by mouth 2 (two) times daily., Disp: 60 tablet, Rfl: 0   Buprenorphine HCl-Naloxone HCl 8-2 MG FILM, Place 1 tablet under the tongue in the morning and at bedtime. , Disp: , Rfl:    eplerenone (INSPRA) 50 MG tablet, Take 2 tablets (100 mg total) by mouth daily., Disp: 60 tablet, Rfl: 3   gabapentin (NEURONTIN) 800 MG tablet, Take 1 tablet (800 mg total) by mouth 3 (three) times daily., Disp: 90 tablet, Rfl: 1   metolazone (ZAROXOLYN) 2.5 MG tablet, Take 1 tablet (2.5 mg total) by mouth 2 (two) times a week. Monday and Fridays, Disp: 10 tablet, Rfl: 3   metoprolol succinate (TOPROL XL) 25 MG 24 hr tablet, Take 1 tablet (25 mg total) by mouth 2 (two) times daily., Disp: 180 tablet, Rfl: 3   ondansetron (ZOFRAN-ODT)  4 MG disintegrating tablet, TAKE 1 TABLET BY MOUTH EVERY 8 HOURS AS NEEDED FOR NAUSEA OR VOMITING, Disp: 20 tablet, Rfl: 0   pantoprazole (PROTONIX) 40 MG tablet, Take 40 mg by mouth 2 (two) times daily., Disp: , Rfl:    polyethylene glycol powder (GLYCOLAX/MIRALAX) 17 GM/SCOOP powder, Take 17 g by mouth daily as needed for mild constipation., Disp: , Rfl:    potassium chloride SA (KLOR-CON) 20 MEQ tablet, Take 3 tablets (60 mEq total) by mouth 2 (two) times daily., Disp: 180 tablet, Rfl:  11   pravastatin (PRAVACHOL) 20 MG tablet, Take 1 tablet (20 mg total) by mouth daily., Disp: 30 tablet, Rfl: 1   senna (SENOKOT) 8.6 MG TABS tablet, Take 2 tablets (17.2 mg total) by mouth daily. (Patient not taking: Reported on 10/16/2020), Disp: 120 tablet, Rfl: 0   sertraline (ZOLOFT) 25 MG tablet, Take 1 tablet (25 mg total) by mouth daily., Disp: 30 tablet, Rfl: 0   sodium chloride (OCEAN) 0.65 % SOLN nasal spray, Place 1 spray into both nostrils as needed for congestion., Disp: , Rfl:    tamsulosin (FLOMAX) 0.4 MG CAPS capsule, Take 1 capsule (0.4 mg total) by mouth daily., Disp: 30 capsule, Rfl: 3   torsemide (DEMADEX) 100 MG tablet, Take 1 tablet (100 mg total) by mouth 2 (two) times daily., Disp: 60 tablet, Rfl: 3   VENTOLIN HFA 108 (90 Base) MCG/ACT inhaler, Inhale 1 puff into the lungs every 4 (four) hours as needed for shortness of breath., Disp: 6.7 g, Rfl: 1   zolpidem (AMBIEN) 10 MG tablet, Take 10 mg by mouth at bedtime., Disp: , Rfl:  Allergies  Allergen Reactions   Penicillins     Broke out in convulsions as a child  Did it involve swelling of the face/tongue/throat, SOB, or low BP? N/A Did it involve sudden or severe rash/hives, skin peeling, or any reaction on the inside of your mouth or nose? N/A Did you need to seek medical attention at a hospital or doctor's office?N/A When did it last happen? N/A If all above answers are "NO", may proceed with cephalosporin use.     Social History   Socioeconomic History   Marital status: Single    Spouse name: Not on file   Number of children: Not on file   Years of education: Not on file   Highest education level: Not on file  Occupational History   Not on file  Tobacco Use   Smoking status: Never   Smokeless tobacco: Never  Vaping Use   Vaping Use: Never used  Substance and Sexual Activity   Alcohol use: Not Currently   Drug use: Not Currently    Types: Marijuana    Comment: Smoked for 30 years   Sexual activity: Not  on file  Other Topics Concern   Not on file  Social History Narrative   Not on file   Social Determinants of Health   Financial Resource Strain: Medium Risk   Difficulty of Paying Living Expenses: Somewhat hard  Food Insecurity: No Food Insecurity   Worried About Running Out of Food in the Last Year: Never true   Ran Out of Food in the Last Year: Never true  Transportation Needs: Unmet Transportation Needs   Lack of Transportation (Medical): Yes   Lack of Transportation (Non-Medical): No  Physical Activity: Not on file  Stress: Not on file  Social Connections: Not on file  Intimate Partner Violence: Not on file    Physical Exam  Vitals reviewed.  Constitutional:      Appearance: Normal appearance. He is normal weight.  HENT:     Head: Normocephalic.     Nose: Nose normal.     Mouth/Throat:     Mouth: Mucous membranes are moist.     Pharynx: Oropharynx is clear.  Eyes:     Conjunctiva/sclera: Conjunctivae normal.     Pupils: Pupils are equal, round, and reactive to light.  Cardiovascular:     Rate and Rhythm: Normal rate and regular rhythm.     Pulses: Normal pulses.     Heart sounds: Normal heart sounds.  Pulmonary:     Effort: Pulmonary effort is normal.     Breath sounds: Normal breath sounds.  Abdominal:     General: Abdomen is flat.     Palpations: Abdomen is soft.  Musculoskeletal:        General: Swelling present. Normal range of motion.     Cervical back: Normal range of motion.     Right lower leg: Edema present.     Left lower leg: Edema present.  Skin:    General: Skin is warm and dry.     Capillary Refill: Capillary refill takes less than 2 seconds.  Neurological:     General: No focal deficit present.     Mental Status: He is alert. Mental status is at baseline.  Psychiatric:        Mood and Affect: Mood normal.    Arrived for home visit for Lukas who was alert and oriented reporting to be feeling poorly. He states, "I just feel so sick". He  states he has intermitted chest tightness, nausea and sweating, shortness of breath and dizziness. I assessed vitals and all were within normal range. Some swelling and edema noted to lower legs which is normal for the patient.   Hermenegildo received two doses of 9m Lasix last week in clinic and has only lost 5lbs since his last visit.   Weight last week-381lbs Weight today- 376lbs    Abdullahi reports he has not been eating salty foods and denies drinking too much fluids.   He states he feels dry mouth and thirsty often and has increased salivation. I recommended him talk to his doctor about Diabetes and A1C lab work. Today his CBG was 171 after breakfast. (Avocado toast with mayonnaise)  DAlontaesees his PCP on July 27th at 2:45   Darean reports itching, pain around his breasts with Inspra, [facility]   Loreto was reminded of appointments.   Medications were reviewed and confirmed. Pill box filled accordingly.   Home visit complete. I will return in one week.   Refills: NONE  Future Appointments  Date Time Provider DMason 11/08/2020 12:00 PM [facility] PA/NP [facility] None     ACTION: Home visit completed

## 2020-10-27 ENCOUNTER — Encounter (HOSPITAL_COMMUNITY): Payer: Self-pay

## 2020-10-28 ENCOUNTER — Emergency Department (HOSPITAL_COMMUNITY): Payer: Medicare HMO

## 2020-10-28 ENCOUNTER — Inpatient Hospital Stay (HOSPITAL_COMMUNITY)
Admission: EM | Admit: 2020-10-28 | Discharge: 2020-11-02 | DRG: 286 | Disposition: A | Discharge status: Home Health | Payer: Medicare HMO | Attending: Internal Medicine | Admitting: Internal Medicine

## 2020-10-28 ENCOUNTER — Other Ambulatory Visit: Payer: Self-pay

## 2020-10-28 DIAGNOSIS — E876 Hypokalemia: Secondary | ICD-10-CM | POA: Diagnosis present

## 2020-10-28 DIAGNOSIS — Z7901 Long term (current) use of anticoagulants: Secondary | ICD-10-CM

## 2020-10-28 DIAGNOSIS — I5030 Unspecified diastolic (congestive) heart failure: Secondary | ICD-10-CM | POA: Diagnosis not present

## 2020-10-28 DIAGNOSIS — G894 Chronic pain syndrome: Secondary | ICD-10-CM | POA: Diagnosis present

## 2020-10-28 DIAGNOSIS — F419 Anxiety disorder, unspecified: Secondary | ICD-10-CM | POA: Diagnosis present

## 2020-10-28 DIAGNOSIS — Z79899 Other long term (current) drug therapy: Secondary | ICD-10-CM

## 2020-10-28 DIAGNOSIS — D638 Anemia in other chronic diseases classified elsewhere: Secondary | ICD-10-CM | POA: Diagnosis present

## 2020-10-28 DIAGNOSIS — K3189 Other diseases of stomach and duodenum: Secondary | ICD-10-CM | POA: Diagnosis present

## 2020-10-28 DIAGNOSIS — R55 Syncope and collapse: Secondary | ICD-10-CM | POA: Diagnosis present

## 2020-10-28 DIAGNOSIS — W19XXXA Unspecified fall, initial encounter: Secondary | ICD-10-CM | POA: Diagnosis present

## 2020-10-28 DIAGNOSIS — K7460 Unspecified cirrhosis of liver: Secondary | ICD-10-CM | POA: Diagnosis present

## 2020-10-28 DIAGNOSIS — K219 Gastro-esophageal reflux disease without esophagitis: Secondary | ICD-10-CM | POA: Diagnosis present

## 2020-10-28 DIAGNOSIS — J449 Chronic obstructive pulmonary disease, unspecified: Secondary | ICD-10-CM | POA: Diagnosis present

## 2020-10-28 DIAGNOSIS — E782 Mixed hyperlipidemia: Secondary | ICD-10-CM | POA: Diagnosis present

## 2020-10-28 DIAGNOSIS — I5082 Biventricular heart failure: Secondary | ICD-10-CM | POA: Diagnosis present

## 2020-10-28 DIAGNOSIS — I110 Hypertensive heart disease with heart failure: Secondary | ICD-10-CM | POA: Diagnosis present

## 2020-10-28 DIAGNOSIS — S92414A Nondisplaced fracture of proximal phalanx of right great toe, initial encounter for closed fracture: Secondary | ICD-10-CM | POA: Diagnosis present

## 2020-10-28 DIAGNOSIS — I878 Other specified disorders of veins: Secondary | ICD-10-CM | POA: Diagnosis present

## 2020-10-28 DIAGNOSIS — I509 Heart failure, unspecified: Secondary | ICD-10-CM | POA: Diagnosis present

## 2020-10-28 DIAGNOSIS — I5033 Acute on chronic diastolic (congestive) heart failure: Secondary | ICD-10-CM | POA: Diagnosis present

## 2020-10-28 DIAGNOSIS — K766 Portal hypertension: Secondary | ICD-10-CM | POA: Diagnosis present

## 2020-10-28 DIAGNOSIS — I10 Essential (primary) hypertension: Secondary | ICD-10-CM | POA: Diagnosis present

## 2020-10-28 DIAGNOSIS — Z79891 Long term (current) use of opiate analgesic: Secondary | ICD-10-CM

## 2020-10-28 DIAGNOSIS — Z8249 Family history of ischemic heart disease and other diseases of the circulatory system: Secondary | ICD-10-CM

## 2020-10-28 DIAGNOSIS — Z86711 Personal history of pulmonary embolism: Secondary | ICD-10-CM

## 2020-10-28 DIAGNOSIS — I480 Paroxysmal atrial fibrillation: Secondary | ICD-10-CM | POA: Diagnosis present

## 2020-10-28 DIAGNOSIS — Z20822 Contact with and (suspected) exposure to covid-19: Secondary | ICD-10-CM | POA: Diagnosis present

## 2020-10-28 DIAGNOSIS — F32A Depression, unspecified: Secondary | ICD-10-CM | POA: Diagnosis present

## 2020-10-28 DIAGNOSIS — K760 Fatty (change of) liver, not elsewhere classified: Secondary | ICD-10-CM | POA: Diagnosis present

## 2020-10-28 DIAGNOSIS — G4733 Obstructive sleep apnea (adult) (pediatric): Secondary | ICD-10-CM | POA: Diagnosis present

## 2020-10-28 DIAGNOSIS — R609 Edema, unspecified: Secondary | ICD-10-CM

## 2020-10-28 DIAGNOSIS — I2699 Other pulmonary embolism without acute cor pulmonale: Secondary | ICD-10-CM | POA: Diagnosis present

## 2020-10-28 DIAGNOSIS — Z6841 Body Mass Index (BMI) 40.0 and over, adult: Secondary | ICD-10-CM

## 2020-10-28 DIAGNOSIS — G4700 Insomnia, unspecified: Secondary | ICD-10-CM | POA: Diagnosis present

## 2020-10-28 DIAGNOSIS — Z83438 Family history of other disorder of lipoprotein metabolism and other lipidemia: Secondary | ICD-10-CM

## 2020-10-28 DIAGNOSIS — I5031 Acute diastolic (congestive) heart failure: Secondary | ICD-10-CM | POA: Diagnosis not present

## 2020-10-28 DIAGNOSIS — Z86718 Personal history of other venous thrombosis and embolism: Secondary | ICD-10-CM

## 2020-10-28 LAB — CBC WITH DIFFERENTIAL/PLATELET
Abs Immature Granulocytes: 0.02 10*3/uL (ref 0.00–0.07)
Basophils Absolute: 0 10*3/uL (ref 0.0–0.1)
Basophils Relative: 0 %
Eosinophils Absolute: 0.1 10*3/uL (ref 0.0–0.5)
Eosinophils Relative: 2 %
HCT: 37.6 % — ABNORMAL LOW (ref 39.0–52.0)
Hemoglobin: 11.1 g/dL — ABNORMAL LOW (ref 13.0–17.0)
Immature Granulocytes: 0 %
Lymphocytes Relative: 10 %
Lymphs Abs: 0.8 10*3/uL (ref 0.7–4.0)
MCH: 23.6 pg — ABNORMAL LOW (ref 26.0–34.0)
MCHC: 29.5 g/dL — ABNORMAL LOW (ref 30.0–36.0)
MCV: 80 fL (ref 80.0–100.0)
Monocytes Absolute: 0.6 10*3/uL (ref 0.1–1.0)
Monocytes Relative: 8 %
Neutro Abs: 5.8 10*3/uL (ref 1.7–7.7)
Neutrophils Relative %: 80 %
Platelets: 146 10*3/uL — ABNORMAL LOW (ref 150–400)
RBC: 4.7 MIL/uL (ref 4.22–5.81)
RDW: 16.8 % — ABNORMAL HIGH (ref 11.5–15.5)
WBC: 7.4 10*3/uL (ref 4.0–10.5)
nRBC: 0 % (ref 0.0–0.2)

## 2020-10-28 LAB — RESP PANEL BY RT-PCR (FLU A&B, COVID) ARPGX2
Influenza A by PCR: NEGATIVE
Influenza B by PCR: NEGATIVE
SARS Coronavirus 2 by RT PCR: NEGATIVE

## 2020-10-28 LAB — BASIC METABOLIC PANEL
Anion gap: 9 (ref 5–15)
BUN: 10 mg/dL (ref 8–23)
CO2: 31 mmol/L (ref 22–32)
Calcium: 9.3 mg/dL (ref 8.9–10.3)
Chloride: 97 mmol/L — ABNORMAL LOW (ref 98–111)
Creatinine, Ser: 0.92 mg/dL (ref 0.61–1.24)
GFR, Estimated: >60 mL/min (ref >60)
Glucose, Bld: 130 mg/dL — ABNORMAL HIGH (ref 70–99)
Potassium: 3.8 mmol/L (ref 3.5–5.1)
Sodium: 137 mmol/L (ref 135–145)

## 2020-10-28 LAB — BRAIN NATRIURETIC PEPTIDE: B Natriuretic Peptide: 28.1 pg/mL (ref 0.0–100.0)

## 2020-10-28 LAB — TROPONIN I (HIGH SENSITIVITY)
Troponin I (High Sensitivity): 6 ng/L (ref <18)
Troponin I (High Sensitivity): 6 ng/L (ref <18)

## 2020-10-28 MED ORDER — METOPROLOL SUCCINATE ER 25 MG PO TB24
25.0000 mg | ORAL_TABLET | Freq: Two times a day (BID) | ORAL | Status: DC
  Administered 2020-10-28 – 2020-11-02 (×9): 25 mg via ORAL
  Filled 2020-10-28 (×10): qty 1

## 2020-10-28 MED ORDER — SODIUM CHLORIDE 0.9% FLUSH
3.0000 mL | Freq: Two times a day (BID) | INTRAVENOUS | Status: DC
  Administered 2020-10-28 – 2020-11-02 (×9): 3 mL via INTRAVENOUS

## 2020-10-28 MED ORDER — TAMSULOSIN HCL 0.4 MG PO CAPS
0.4000 mg | ORAL_CAPSULE | Freq: Every day | ORAL | Status: DC
  Administered 2020-10-29 – 2020-11-02 (×5): 0.4 mg via ORAL
  Filled 2020-10-28 (×5): qty 1

## 2020-10-28 MED ORDER — APIXABAN 5 MG PO TABS
5.0000 mg | ORAL_TABLET | Freq: Two times a day (BID) | ORAL | Status: DC
  Administered 2020-10-28 – 2020-10-31 (×7): 5 mg via ORAL
  Filled 2020-10-28 (×7): qty 1

## 2020-10-28 MED ORDER — BUPRENORPHINE HCL-NALOXONE HCL 8-2 MG SL FILM: 1.0000 | ORAL_FILM | Freq: Two times a day (BID) | SUBLINGUAL | Status: DC

## 2020-10-28 MED ORDER — PANTOPRAZOLE SODIUM 40 MG PO TBEC
40.0000 mg | DELAYED_RELEASE_TABLET | Freq: Two times a day (BID) | ORAL | Status: DC
  Administered 2020-10-28 – 2020-11-02 (×10): 40 mg via ORAL
  Filled 2020-10-28 (×10): qty 1

## 2020-10-28 MED ORDER — SERTRALINE HCL 50 MG PO TABS
25.0000 mg | ORAL_TABLET | Freq: Every day | ORAL | Status: DC
  Administered 2020-10-29 – 2020-11-02 (×5): 25 mg via ORAL
  Filled 2020-10-28 (×5): qty 1

## 2020-10-28 MED ORDER — SENNA 8.6 MG PO TABS
2.0000 | ORAL_TABLET | Freq: Every day | ORAL | Status: DC
  Administered 2020-10-29 – 2020-11-02 (×3): 17.2 mg via ORAL
  Filled 2020-10-28 (×5): qty 2

## 2020-10-28 MED ORDER — POTASSIUM CHLORIDE CRYS ER 20 MEQ PO TBCR
80.0000 meq | EXTENDED_RELEASE_TABLET | Freq: Two times a day (BID) | ORAL | Status: DC
  Administered 2020-10-29 – 2020-11-02 (×9): 80 meq via ORAL
  Filled 2020-10-28 (×9): qty 4

## 2020-10-28 MED ORDER — PRAVASTATIN SODIUM 10 MG PO TABS
20.0000 mg | ORAL_TABLET | Freq: Every day | ORAL | Status: DC
  Administered 2020-10-29 – 2020-11-02 (×5): 20 mg via ORAL
  Filled 2020-10-28 (×5): qty 2

## 2020-10-28 MED ORDER — FUROSEMIDE 10 MG/ML IJ SOLN: 60.0000 mg | Freq: Two times a day (BID) | INTRAMUSCULAR | Status: DC

## 2020-10-28 MED ORDER — METOLAZONE 2.5 MG PO TABS
2.5000 mg | ORAL_TABLET | Freq: Every day | ORAL | Status: DC
  Administered 2020-10-28 – 2020-10-29 (×2): 2.5 mg via ORAL
  Filled 2020-10-28 (×3): qty 1

## 2020-10-28 MED ORDER — ACETAMINOPHEN 500 MG PO TABS
1000.0000 mg | ORAL_TABLET | Freq: Four times a day (QID) | ORAL | Status: DC | PRN
  Administered 2020-10-29 (×2): 1000 mg via ORAL
  Filled 2020-10-28 (×2): qty 2

## 2020-10-28 MED ORDER — ONDANSETRON 4 MG PO TBDP
4.0000 mg | ORAL_TABLET | Freq: Three times a day (TID) | ORAL | Status: DC | PRN
  Administered 2020-10-29 – 2020-11-01 (×4): 4 mg via ORAL
  Filled 2020-10-28 (×5): qty 1

## 2020-10-28 MED ORDER — SPIRONOLACTONE 25 MG PO TABS
25.0000 mg | ORAL_TABLET | Freq: Every day | ORAL | Status: DC
  Filled 2020-10-28: qty 1

## 2020-10-28 MED ORDER — POLYETHYLENE GLYCOL 3350 17 GM/SCOOP PO POWD
17.0000 g | Freq: Every day | ORAL | Status: DC | PRN
  Filled 2020-10-28: qty 255

## 2020-10-28 MED ORDER — ZOLPIDEM TARTRATE 5 MG PO TABS
5.0000 mg | ORAL_TABLET | Freq: Every day | ORAL | Status: DC
  Administered 2020-10-28: 5 mg via ORAL
  Filled 2020-10-28: qty 1

## 2020-10-28 MED ORDER — METOLAZONE 2.5 MG PO TABS: 2.5000 mg | ORAL_TABLET | ORAL | Status: DC

## 2020-10-28 MED ORDER — POLYETHYLENE GLYCOL 3350 17 G PO PACK
17.0000 g | PACK | Freq: Every day | ORAL | Status: DC | PRN
  Administered 2020-10-30: 17 g via ORAL
  Filled 2020-10-28: qty 1

## 2020-10-28 MED ORDER — SALINE SPRAY 0.65 % NA SOLN
1.0000 | NASAL | Status: DC | PRN
  Filled 2020-10-28: qty 44

## 2020-10-28 MED ORDER — GABAPENTIN 400 MG PO CAPS
800.0000 mg | ORAL_CAPSULE | Freq: Three times a day (TID) | ORAL | Status: DC
  Administered 2020-10-28 – 2020-11-02 (×14): 800 mg via ORAL
  Filled 2020-10-28 (×2): qty 2
  Filled 2020-10-28: qty 8
  Filled 2020-10-28 (×13): qty 2

## 2020-10-28 MED ORDER — ALBUTEROL SULFATE (2.5 MG/3ML) 0.083% IN NEBU: 2.5000 mg | INHALATION_SOLUTION | RESPIRATORY_TRACT | Status: DC | PRN

## 2020-10-28 MED ORDER — FUROSEMIDE 10 MG/ML IJ SOLN
80.0000 mg | Freq: Two times a day (BID) | INTRAMUSCULAR | Status: DC
  Administered 2020-10-29 – 2020-11-01 (×7): 80 mg via INTRAVENOUS
  Filled 2020-10-28 (×7): qty 8

## 2020-10-28 MED ORDER — FUROSEMIDE 10 MG/ML IJ SOLN
40.0000 mg | Freq: Once | INTRAMUSCULAR | Status: AC
  Administered 2020-10-28: 40 mg via INTRAVENOUS
  Filled 2020-10-28: qty 4

## 2020-10-28 NOTE — ED Provider Notes (Signed)
Emergency Medicine Provider Triage Evaluation Note  Charles Gray , a 65 y.o. male  was evaluated in triage.  Pt complains of syncopal episode. Patient states he woke up around 10AM, felt dizzy and like his "legs weren't working" and he fell to the ground. He states he passed out from possibly 20 minutes. He is currently on Eliquis. He endorses right shoulder and right great toe pain following the syncopal episode. No history of seizures. No urinary incontinence or mouth trauma. He is also concerned about weight gain. History of CHF. He admits to some chest pain earlier today  Review of Systems  Positive: Syncope, lower extremity edema, CP Negative: fever  Physical Exam  BP 128/76 (BP Location: Right Arm)   Pulse 75   Temp 99.7 F (37.6 C) (Oral)   Resp 20   Ht 6' 10"  (2.083 m)   Wt (!) 176.9 kg   SpO2 95%   BMI 40.78 kg/m  Gen:   Awake, no distress   Resp:  Normal effort  MSK:   Moves extremities without difficulty  Other:    Medical Decision Making  Medically screening exam initiated at 1:38 PM.  Appropriate orders placed.  [name] was informed that the remainder of the evaluation will be completed by another provider, this initial triage assessment does not replace that evaluation, and the importance of remaining in the ED until their evaluation is complete.  Labs ordered. CT head/cervical spine due to head injury. X-rays to rule out bony fractures. Charge RN, Thurmond Butts made aware that patient sustained a head injury and is on anticoagulants.    Suzy Bouchard, PA-C 10/28/20 [place], MD 11/07/20 [phone]

## 2020-10-28 NOTE — H&P (Signed)
History and Physical    Charles Gray QZE:[id number] DOB: 15-Jan-1956 DOA: 10/28/2020  PCP: Enid Skeens., MD (Confirm with patient/family/NH records and if not entered, this has to be entered at [facility] point of entry) Patient coming from: [place]  I have personally briefly reviewed patient's old medical records in [place]  Chief Complaint: I passed out.  HPI: Charles Gray is a 65 y.o. male with medical history significant of chronic diastolic CHF, chronic right-sided CHF, HTN, PAF on Eliquis, morbid obesity, chronic ambulation dysfunction, presented with syncope and fall.  Patient has had poorly controlled CHF lately, with significant weight gaining and less responsive to torsemide.  His cardiologist increased torsemide from 80 mg twice daily to 100 mg twice daily, and increased metolazone from once a week to 2 times a week.  And patient has been taking all his medication, but despite, patient continues to gain weight, and he cannot 10 pounds in the last 7 days.  He has home visiting nurse evaluate him every Tuesday, last Tuesday his blood pressure was in the 120s and he feels fine.  However over the last 6 to 7 days, he gradually sees leg swelling up and gained 10 pounds, despite on increasing doses of diuresis.  He denies any chest pain or shortness of breath.  He has been feeling weaker and weaker, and this morning he got up and go to the bathroom and suddenly felt lightheaded and collapsed.  Recovered consciousness in 15 to 20 minutes.  Refolding but landed on right shoulder right knee and right foot and complaining about pains.  No headache, shortness of breath or chest pains.  ED Course: Afebrile, blood pressure in the 110s, trauma scan CT head cervical spine shoulder and foot negative for acute fractures dislocations.  1 dose of 40 mg IV Lasix given in the ED.  Review of Systems: As per HPI otherwise 14 point review of systems negative.    Past Medical History:  Diagnosis  Date   Acute on chronic congestive heart failure (HCC)    Acute on chronic diastolic CHF (congestive heart failure) ([place]) 08/23/2019   Acute on chronic right-sided heart failure ([place]) 08/06/2019   Acute respiratory failure with hypoxia (HCC)    Anemia of chronic disease    Atrial fibrillation ([place]) 12/23/2019   Atrial tachycardia ([place]) 08/12/2019   Benign essential HTN    Chest pain 08/23/2019   Chronic anticoagulation 12/26/2019   Chronic heart failure with preserved EF 05/30/2019   Echo 07/2019: EF 55-60, GR 1 DD   Chronic pain syndrome    Cirrhosis of liver without ascites ([place])    Class 2 severe obesity due to excess calories with serious comorbidity and body mass index (BMI) of 39.0 to 39.9 in adult ([place]) 05/30/2019   Debility 03/13/2019   Drug induced constipation    Dyspnea    Fatty liver disease, nonalcoholic    GIB (gastrointestinal bleeding) 03/04/2019   Heart failure, systolic, acute ([place]) 06/08/3333   History of pulmonary embolism 08/12/2019   Hyperlipidemia 01/24/2019   Hypertension 01/24/2019   Hypoalbuminemia due to protein-calorie malnutrition (HCC)    Hypokalemia    Hyponatremia    Hypotension due to drugs    Insomnia 01/24/2019   Lactic acidosis 08/23/2019   Lumbar disc herniation 01/24/2019   Mixed hyperlipidemia 01/24/2019   Morbid obesity with BMI of 40.0-44.9, adult ([place]) 05/30/2019   Normocytic anemia 06/08/2019   Occasional tremors 08/12/2019   OSA (obstructive sleep apnea) 08/12/2019   Palpitations  12/26/2019   Peripheral edema    Personal history of DVT (deep vein thrombosis) 12/26/2019   Portal hypertensive gastropathy ([place]) 03/06/2019   Seen on EGD 03/06/2019   Pulmonary emboli ([place]) 06/08/2019   Pulmonary embolism ([place]) 06/08/2019   Redness and swelling of lower leg 08/23/2019   Shock ([place])    Thrombocytopenia ([place])     Past Surgical History:  Procedure Laterality Date   ESOPHAGOGASTRODUODENOSCOPY (EGD) WITH PROPOFOL N/A 03/05/2019   Procedure:  ESOPHAGOGASTRODUODENOSCOPY (EGD) WITH PROPOFOL;  Surgeon: Doran Stabler, MD;  Location: [facility];  Service: Gastroenterology;  Laterality: N/A;  Large Blood Clot Removed   ESOPHAGOGASTRODUODENOSCOPY (EGD) WITH PROPOFOL N/A 03/06/2019   Procedure: ESOPHAGOGASTRODUODENOSCOPY (EGD) WITH PROPOFOL;  Surgeon: Thornton Park, MD;  Location: [facility];  Service: Gastroenterology;  Laterality: N/A;   HEMORROIDECTOMY     RIGHT HEART CATH N/A 08/09/2019   Procedure: RIGHT HEART CATH;  Surgeon: Larey Dresser, MD;  Location: [facility];  Service: Cardiovascular;  Laterality: N/A;   RIGHT/LEFT HEART CATH AND CORONARY ANGIOGRAPHY N/A 05/10/2020   Procedure: RIGHT/LEFT HEART CATH AND CORONARY ANGIOGRAPHY;  Surgeon: Larey Dresser, MD;  Location: [facility];  Service: Cardiovascular;  Laterality: N/A;     reports that he has never smoked. He has never used smokeless tobacco. He reports previous alcohol use. He reports previous drug use. Drug: Marijuana.  Allergies  Allergen Reactions   Penicillins     Broke out in convulsions as a child  Did it involve swelling of the face/tongue/throat, SOB, or low BP? N/A Did it involve sudden or severe rash/hives, skin peeling, or any reaction on the inside of your mouth or nose? N/A Did you need to seek medical attention at a hospital or doctor's office?N/A When did it last happen? N/A If all above answers are "NO", may proceed with cephalosporin use.    Family History  Problem Relation Age of Onset   Hyperlipidemia Mother    Hypertension Mother    Stroke Mother    CAD Maternal Grandmother    CAD Maternal Grandfather      Prior to Admission medications   Medication Sig Start Date End Date Taking? Authorizing Provider  acetaminophen (TYLENOL) 500 MG tablet Take 1,000 mg by mouth every 6 (six) hours as needed for moderate pain or headache.    [provider]  apixaban (ELIQUIS) 5 MG TABS tablet Take 1 tablet (5 mg  total) by mouth 2 (two) times daily. 08/12/19   Strader, Fransisco Hertz, PA-C  Buprenorphine HCl-Naloxone HCl 8-2 MG FILM Place 1 tablet under the tongue in the morning and at bedtime.     [provider]  eplerenone (INSPRA) 50 MG tablet Take 2 tablets (100 mg total) by mouth daily. 06/25/20   Clegg, [name] D, NP  gabapentin (NEURONTIN) 800 MG tablet Take 1 tablet (800 mg total) by mouth 3 (three) times daily. 03/23/19   Angiulli, Lavon Paganini, PA-C  metolazone (ZAROXOLYN) 2.5 MG tablet Take 1 tablet (2.5 mg total) by mouth 2 (two) times a week. Monday and Fridays 09/26/20   Edwin Dada, MD  metoprolol succinate (TOPROL XL) 25 MG 24 hr tablet Take 1 tablet (25 mg total) by mouth 2 (two) times daily. 06/25/20 10/22/20  Larey Dresser, MD  ondansetron (ZOFRAN-ODT) 4 MG disintegrating tablet TAKE 1 TABLET BY MOUTH EVERY 8 HOURS AS NEEDED FOR NAUSEA OR VOMITING 10/21/20   Larey Dresser, MD  pantoprazole (PROTONIX) 40 MG tablet Take  40 mg by mouth 2 (two) times daily.    [provider]  polyethylene glycol powder (GLYCOLAX/MIRALAX) 17 GM/SCOOP powder Take 17 g by mouth daily as needed for mild constipation.    [provider]  potassium chloride SA (KLOR-CON) 20 MEQ tablet Take 4 tablets (80 mEq total) by mouth 2 (two) times daily. 10/22/20   Larey Dresser, MD  pravastatin (PRAVACHOL) 20 MG tablet Take 1 tablet (20 mg total) by mouth daily. 03/23/19   Angiulli, Lavon Paganini, PA-C  senna (SENOKOT) 8.6 MG TABS tablet Take 2 tablets (17.2 mg total) by mouth daily. 06/13/20   [place] Donnamarie Poag, DO  sertraline (ZOLOFT) 25 MG tablet Take 1 tablet (25 mg total) by mouth daily. 06/25/20   Larey Dresser, MD  sodium chloride (OCEAN) 0.65 % SOLN nasal spray Place 1 spray into both nostrils as needed for congestion.    [provider]  tamsulosin (FLOMAX) 0.4 MG CAPS capsule Take 1 capsule (0.4 mg total) by mouth daily. 09/27/20   Danford, Suann Larry, MD  torsemide (DEMADEX) 100 MG  tablet Take 1 tablet (100 mg total) by mouth 2 (two) times daily. 09/26/20   Danford, Suann Larry, MD  VENTOLIN HFA 108 (90 Base) MCG/ACT inhaler Inhale 1 puff into the lungs every 4 (four) hours as needed for shortness of breath. 03/23/19   Angiulli, Lavon Paganini, PA-C  zolpidem (AMBIEN) 10 MG tablet Take 10 mg by mouth at bedtime. 05/19/19   [provider]  bumetanide (BUMEX) 1 MG tablet Take 4 tablets (4 mg total) by mouth daily with breakfast AND 3 tablets (3 mg total) every evening. 05/10/20 06/13/20  Larey Dresser, MD  metoprolol tartrate (LOPRESSOR) 50 MG tablet Take 1 tablet (50 mg total) by mouth 2 (two) times daily. 02/09/20 04/11/20  Mercy Riding, MD  spironolactone (ALDACTONE) 100 MG tablet Take 1 tablet (100 mg total) by mouth daily. 02/10/20 02/27/20  Mercy Riding, MD    Physical Exam: Vitals:   10/28/20 1650 10/28/20 1715 10/28/20 1730 10/28/20 1800  BP: 111/71 111/64 104/65 118/70  Pulse: 64 65 (!) 57 66  Resp: 14 14 14 16   Temp:      TempSrc:      SpO2: 96% 95% 97% 99%  Weight:      Height:        Constitutional: NAD, calm, comfortable Vitals:   10/28/20 1650 10/28/20 1715 10/28/20 1730 10/28/20 1800  BP: 111/71 111/64 104/65 118/70  Pulse: 64 65 (!) 57 66  Resp: 14 14 14 16   Temp:      TempSrc:      SpO2: 96% 95% 97% 99%  Weight:      Height:       Eyes: PERRL, lids and conjunctivae normal ENMT: Mucous membranes are moist. Posterior pharynx clear of any exudate or lesions.Normal dentition.  Neck: normal, supple, no masses, no thyromegaly Respiratory: clear to auscultation bilaterally, no wheezing, no crackles. Normal respiratory effort. No accessory muscle use.  Cardiovascular: Regular rate and rhythm, no murmurs / rubs / gallops. 2+ extremity edema. 2+ pedal pulses. No carotid bruits.  Abdomen: no tenderness, no masses palpated. No hepatosplenomegaly. Bowel sounds positive.  Musculoskeletal: no clubbing / cyanosis. No joint deformity upper and lower  extremities. Good ROM, no contractures. Normal muscle tone.  Skin: no rashes, lesions, ulcers. No induration Neurologic: CN 2-12 grossly intact. Sensation intact, DTR normal. Strength 5/5 in all 4.  Psychiatric: Normal judgment and insight. Alert and oriented  x 3. Normal mood.     Labs on Admission: I have personally reviewed following labs and imaging studies  CBC: Recent Labs  Lab 10/28/20 1338  WBC 7.4  NEUTROABS 5.8  HGB 11.1*  HCT 37.6*  MCV 80.0  PLT 220*   Basic Metabolic Panel: Recent Labs  Lab 10/28/20 1338  NA 137  K 3.8  CL 97*  CO2 31  GLUCOSE 130*  BUN 10  CREATININE 0.92  CALCIUM 9.3   GFR: Estimated Creatinine Clearance: 150.4 mL/min (by C-G formula based on SCr of 0.92 mg/dL). Liver Function Tests: No results for input(s): AST, ALT, ALKPHOS, BILITOT, PROT, ALBUMIN in the last 168 hours. No results for input(s): LIPASE, AMYLASE in the last 168 hours. No results for input(s): AMMONIA in the last 168 hours. Coagulation Profile: No results for input(s): INR, PROTIME in the last 168 hours. Cardiac Enzymes: No results for input(s): CKTOTAL, CKMB, CKMBINDEX, TROPONINI in the last 168 hours. BNP (last 3 results) No results for input(s): PROBNP in the last 8760 hours. HbA1C: No results for input(s): HGBA1C in the last 72 hours. CBG: No results for input(s): GLUCAP in the last 168 hours. Lipid Profile: No results for input(s): CHOL, HDL, LDLCALC, TRIG, CHOLHDL, LDLDIRECT in the last 72 hours. Thyroid Function Tests: No results for input(s): TSH, T4TOTAL, FREET4, T3FREE, THYROIDAB in the last 72 hours. Anemia Panel: No results for input(s): VITAMINB12, FOLATE, FERRITIN, TIBC, IRON, RETICCTPCT in the last 72 hours. Urine analysis:    Component Value Date/Time   COLORURINE YELLOW 10/12/2020 [place] 10/12/2020 0754   LABSPEC 1.008 10/12/2020 0754   PHURINE 7.0 10/12/2020 0754   GLUCOSEU NEGATIVE 10/12/2020 0754   HGBUR NEGATIVE  10/12/2020 0754   BILIRUBINUR NEGATIVE 10/12/2020 0754   KETONESUR NEGATIVE 10/12/2020 0754   PROTEINUR NEGATIVE 10/12/2020 0754   NITRITE NEGATIVE 10/12/2020 0754   LEUKOCYTESUR NEGATIVE 10/12/2020 0754    Radiological Exams on Admission: DG Shoulder Right  Result Date: 10/28/2020 CLINICAL DATA:  Syncopal episode fall EXAM: RIGHT SHOULDER - 2+ VIEW COMPARISON:  None. FINDINGS: Moderate AC joint degenerative change. No fracture or malalignment. Slightly high-riding appearance of the humeral head as may be seen with rotator cuff disease. IMPRESSION: No acute osseous abnormality. Electronically Signed   By: Donavan Foil M.D.   On: 10/28/2020 16:54   CT Head Wo Contrast  Result Date: 10/28/2020 CLINICAL DATA:  Provided history: Head trauma, coagulopathy. Level 2 fall on blood thinners. Neck trauma, dangerous injury mechanism. EXAM: CT HEAD WITHOUT CONTRAST CT CERVICAL SPINE WITHOUT CONTRAST TECHNIQUE: Multidetector CT imaging of the head and cervical spine was performed following the standard protocol without intravenous contrast. Multiplanar CT image reconstructions of the cervical spine were also generated. COMPARISON:  CT angiogram head/neck 10/12/2020. FINDINGS: CT HEAD FINDINGS Brain: Mild generalized parenchymal atrophy. There is no acute intracranial hemorrhage. No demarcated cortical infarct. No extra-axial fluid collection. No evidence of an intracranial mass. No midline shift. Partially empty sella turcica. Vascular: No hyperdense vessel. Skull: Normal. Negative for fracture or focal lesion. Sinuses/Orbits: Visualized orbits show no acute finding. Trace bilateral ethmoid sinus mucosal thickening. Mild polypoid mucosal thickening within the inferior maxillary sinuses bilaterally. CT CERVICAL SPINE FINDINGS Alignment: Straightening of the expected cervical lordosis. No significant spondylolisthesis. Skull base and vertebrae: The basion-dental and atlanto-dental intervals are maintained.No  evidence of acute fracture to the cervical spine. Soft tissues and spinal canal: No prevertebral fluid or swelling. No visible canal hematoma. Disc levels: Cervical spondylosis with multilevel  disc space narrowing, disc bulges, posterior disc osteophytes, endplate spurring, uncovertebral hypertrophy and facet arthrosis. Disc space narrowing is greatest at C4-C5, C5-C6, C6-C7 and C7-T1 (moderate/severe). Multilevel spinal canal stenosis. Most notably, there is no least moderate spinal canal stenosis at C4-C5 and C5-C6 and severe spinal canal stenosis at C6-C7. Multilevel bony neural foraminal narrowing. Multilevel ventral osteophytes. Upper chest: No consolidation within the imaged lung apices. No visible pneumothorax. IMPRESSION: CT head: 1. No evidence of acute intracranial abnormality. 2. Mild generalized parenchymal atrophy. 3. Paranasal sinus disease, as described. CT cervical spine: 1. No evidence of acute fracture to the cervical spine. 2. Straightening of the expected cervical lordosis. 3. Cervical spondylosis, as outlined. Notably, there is suspected at least moderate spinal canal stenosis at C4-C5 and C5-C6, and apparent severe spinal canal stenosis at C6-C7. Electronically Signed   By: Kellie Simmering DO   On: 10/28/2020 15:19   CT Cervical Spine Wo Contrast  Result Date: 10/28/2020 CLINICAL DATA:  Provided history: Head trauma, coagulopathy. Level 2 fall on blood thinners. Neck trauma, dangerous injury mechanism. EXAM: CT HEAD WITHOUT CONTRAST CT CERVICAL SPINE WITHOUT CONTRAST TECHNIQUE: Multidetector CT imaging of the head and cervical spine was performed following the standard protocol without intravenous contrast. Multiplanar CT image reconstructions of the cervical spine were also generated. COMPARISON:  CT angiogram head/neck 10/12/2020. FINDINGS: CT HEAD FINDINGS Brain: Mild generalized parenchymal atrophy. There is no acute intracranial hemorrhage. No demarcated cortical infarct. No extra-axial  fluid collection. No evidence of an intracranial mass. No midline shift. Partially empty sella turcica. Vascular: No hyperdense vessel. Skull: Normal. Negative for fracture or focal lesion. Sinuses/Orbits: Visualized orbits show no acute finding. Trace bilateral ethmoid sinus mucosal thickening. Mild polypoid mucosal thickening within the inferior maxillary sinuses bilaterally. CT CERVICAL SPINE FINDINGS Alignment: Straightening of the expected cervical lordosis. No significant spondylolisthesis. Skull base and vertebrae: The basion-dental and atlanto-dental intervals are maintained.No evidence of acute fracture to the cervical spine. Soft tissues and spinal canal: No prevertebral fluid or swelling. No visible canal hematoma. Disc levels: Cervical spondylosis with multilevel disc space narrowing, disc bulges, posterior disc osteophytes, endplate spurring, uncovertebral hypertrophy and facet arthrosis. Disc space narrowing is greatest at C4-C5, C5-C6, C6-C7 and C7-T1 (moderate/severe). Multilevel spinal canal stenosis. Most notably, there is no least moderate spinal canal stenosis at C4-C5 and C5-C6 and severe spinal canal stenosis at C6-C7. Multilevel bony neural foraminal narrowing. Multilevel ventral osteophytes. Upper chest: No consolidation within the imaged lung apices. No visible pneumothorax. IMPRESSION: CT head: 1. No evidence of acute intracranial abnormality. 2. Mild generalized parenchymal atrophy. 3. Paranasal sinus disease, as described. CT cervical spine: 1. No evidence of acute fracture to the cervical spine. 2. Straightening of the expected cervical lordosis. 3. Cervical spondylosis, as outlined. Notably, there is suspected at least moderate spinal canal stenosis at C4-C5 and C5-C6, and apparent severe spinal canal stenosis at C6-C7. Electronically Signed   By: Kellie Simmering DO   On: 10/28/2020 15:19   DG Chest Port 1 View  Result Date: 10/28/2020 CLINICAL DATA:  Recent syncopal episode with chest  pain, initial encounter EXAM: PORTABLE CHEST 1 VIEW COMPARISON:  10/12/2020 FINDINGS: Cardiac shadow is stable. Lungs are well aerated bilaterally. No focal infiltrate or effusion is seen. No acute bony abnormality is noted. IMPRESSION: No active disease. Electronically Signed   By: Inez Catalina M.D.   On: 10/28/2020 14:34   DG Foot Complete Right  Result Date: 10/28/2020 CLINICAL DATA:  Syncope, fall EXAM: RIGHT FOOT COMPLETE -  3+ VIEW COMPARISON:  None. FINDINGS: Bones appear osteopenic. Diffuse soft tissue swelling. Acute intra-articular fracture at the base of the first proximal phalanx. No subluxation IMPRESSION: Acute nondisplaced intra-articular fracture at the base of the first proximal phalanx Electronically Signed   By: Donavan Foil M.D.   On: 10/28/2020 16:56    EKG: Independently reviewed.  Sinus, no acute ST-T changes  Assessment/Plan Active Problems:   Near syncope   CHF (congestive heart failure) ([place])  (please populate well all problems here in Problem List. (For example, if patient is on BP meds at home and you resume or decide to hold them, it is a problem that needs to be her. Same for CAD, COPD, HLD and so on)  Syncope and collapse -Suspect vasovagal reaction, given the prodrome of feeling lightheaded before and patient was on increasing dose of diuresis recent weeks. -Check orthostatic vital signs. -Echocardiogram. -PT evaluation.  Acute on chronic diastolic CHF/right-sided CHF decompensation -Significant fluid overload and weight gaining in past week -Change oral diuresis to IV Lasix 60 mg twice daily.  Continue metolazone, change frequency to daily.  Discussed with on-call cardiology Dr. Davina Poke, who will evaluate the patient tonight.  HTN -Diuresis, beta-blocker.  PAF -Denied any palpitations, telemetry monitoring, orthostatic vital signs. -Metoprolol for rate control, continue Eliquis.  Chronic Suboxone therapy -Continue Suboxone.  DVT prophylaxis:  Eliquis Code Status: Full code Family Communication: None at bedside Disposition Plan: Expect more than 2 midnight hospital stay to treat CHF decompensation Consults called: Cardiology Admission status: Telemetry admission   Lequita Halt MD [facility] Pager [phone]  10/28/2020, 7:20 PM

## 2020-10-28 NOTE — ED Provider Notes (Signed)
[facility] EMERGENCY DEPARTMENT Provider Note   CSN: [id number] Arrival date & time: 10/28/20  1211     History Chief Complaint  Patient presents with   Charles Gray    Charles Gray is a 65 y.o. male.   Fall   Patient presented to the ED for evaluation after syncopal episode and a fall.  Patient states when he went to get up this morning around 10 AM he felt dizzy and lightheaded and his legs were not working properly.  He ended up falling to the ground.  Patient thinks he might of been unconscious for 20 minutes.  Patient is having pain in his right shoulder as well as his big toe.  He denies any headache right now.  He has been having some shortness of breath recently and has noted fluid and weight gain.  He did have some chest pain earlier but none currently  Past Medical History:  Diagnosis Date   Acute on chronic congestive heart failure (HCC)    Acute on chronic diastolic CHF (congestive heart failure) ([place]) 08/23/2019   Acute on chronic right-sided heart failure ([place]) 08/06/2019   Acute respiratory failure with hypoxia (HCC)    Anemia of chronic disease    Atrial fibrillation ([place]) 12/23/2019   Atrial tachycardia ([place]) 08/12/2019   Benign essential HTN    Chest pain 08/23/2019   Chronic anticoagulation 12/26/2019   Chronic heart failure with preserved EF 05/30/2019   Echo 07/2019: EF 55-60, GR 1 DD   Chronic pain syndrome    Cirrhosis of liver without ascites ([place])    Class 2 severe obesity due to excess calories with serious comorbidity and body mass index (BMI) of 39.0 to 39.9 in adult ([place]) 05/30/2019   Debility 03/13/2019   Drug induced constipation    Dyspnea    Fatty liver disease, nonalcoholic    GIB (gastrointestinal bleeding) 03/04/2019   Heart failure, systolic, acute ([place]) 7/37/1062   History of pulmonary embolism 08/12/2019   Hyperlipidemia 01/24/2019   Hypertension 01/24/2019   Hypoalbuminemia due to protein-calorie malnutrition (HCC)     Hypokalemia    Hyponatremia    Hypotension due to drugs    Insomnia 01/24/2019   Lactic acidosis 08/23/2019   Lumbar disc herniation 01/24/2019   Mixed hyperlipidemia 01/24/2019   Morbid obesity with BMI of 40.0-44.9, adult ([place]) 05/30/2019   Normocytic anemia 06/08/2019   Occasional tremors 08/12/2019   OSA (obstructive sleep apnea) 08/12/2019   Palpitations 12/26/2019   Peripheral edema    Personal history of DVT (deep vein thrombosis) 12/26/2019   Portal hypertensive gastropathy ([place]) 03/06/2019   Seen on EGD 03/06/2019   Pulmonary emboli ([place]) 06/08/2019   Pulmonary embolism ([place]) 06/08/2019   Redness and swelling of lower leg 08/23/2019   Shock ([place])    Thrombocytopenia ([place])     Patient Active Problem List   Diagnosis Date Noted   Chronic venous stasis dermatitis of both lower extremities 08/27/2020   Cellulitis of both lower extremities 08/14/2020   Microcytic anemia 08/14/2020   GERD without esophagitis 06/10/2020   Atrial fibrillation with rapid ventricular response ([place]) 03/24/2020   Acute on chronic right heart failure ([place]) 01/27/2020   Acute respiratory failure with hypoxia (HCC)    Fatty liver disease, nonalcoholic    Shock ([place])    Personal history of DVT (deep vein thrombosis) 12/26/2019   Chronic anticoagulation 12/26/2019   Palpitations 12/26/2019   AF (paroxysmal atrial fibrillation) ([place]) 12/23/2019   Heart failure, systolic, acute (  [place]) 08/24/2019   Acute on chronic congestive heart failure (HCC)    Acute on chronic diastolic CHF (congestive heart failure) ([place]) 08/23/2019   Chest pain 08/23/2019   Lactic acidosis 08/23/2019   History of pulmonary embolism 08/12/2019   Atrial tachycardia ([place]) 08/12/2019   Occasional tremors 08/12/2019   OSA (obstructive sleep apnea) 08/12/2019   Acute on chronic right-sided heart failure ([place]) 08/06/2019   Pulmonary embolism ([place]) 06/08/2019   Normocytic anemia 06/08/2019   Pulmonary emboli ([place]) 06/08/2019   Chronic heart  failure with preserved ejection fraction ([place]) 05/30/2019   Class 2 severe obesity due to excess calories with serious comorbidity and body mass index (BMI) of 39.0 to 39.9 in adult ([place]) 05/30/2019   Morbid obesity with BMI of 40.0-44.9, adult ([place]) 05/30/2019   Hyponatremia    Drug induced constipation    Peripheral edema    Hypotension due to drugs    Hypoalbuminemia due to protein-calorie malnutrition (HCC)    Hypokalemia    Thrombocytopenia (HCC)    Anemia of chronic disease    Cirrhosis of liver without ascites ([place])    Chronic pain syndrome    Debility 03/13/2019   Portal hypertensive gastropathy ([place]) 03/06/2019   Dyspnea    GIB (gastrointestinal bleeding) 03/04/2019   Mixed hyperlipidemia 01/24/2019   Insomnia 01/24/2019   Hyperlipidemia 01/24/2019   Essential hypertension 01/24/2019   Lumbar disc herniation 01/24/2019    Past Surgical History:  Procedure Laterality Date   ESOPHAGOGASTRODUODENOSCOPY (EGD) WITH PROPOFOL N/A 03/05/2019   Procedure: ESOPHAGOGASTRODUODENOSCOPY (EGD) WITH PROPOFOL;  Surgeon: Doran Stabler, MD;  Location: [place];  Service: Gastroenterology;  Laterality: N/A;  Large Blood Clot Removed   ESOPHAGOGASTRODUODENOSCOPY (EGD) WITH PROPOFOL N/A 03/06/2019   Procedure: ESOPHAGOGASTRODUODENOSCOPY (EGD) WITH PROPOFOL;  Surgeon: Thornton Park, MD;  Location: [place];  Service: Gastroenterology;  Laterality: N/A;   HEMORROIDECTOMY     RIGHT HEART CATH N/A 08/09/2019   Procedure: RIGHT HEART CATH;  Surgeon: Larey Dresser, MD;  Location: [place] [facility] [facility];  Service: Cardiovascular;  Laterality: N/A;   RIGHT/LEFT HEART CATH AND CORONARY ANGIOGRAPHY N/A 05/10/2020   Procedure: RIGHT/LEFT HEART CATH AND CORONARY ANGIOGRAPHY;  Surgeon: Larey Dresser, MD;  Location: [facility];  Service: Cardiovascular;  Laterality: N/A;       Family History  Problem Relation Age of Onset   Hyperlipidemia Mother    Hypertension Mother     Stroke Mother    CAD Maternal Grandmother    CAD Maternal Grandfather     Social History   Tobacco Use   Smoking status: Never   Smokeless tobacco: Never  Vaping Use   Vaping Use: Never used  Substance Use Topics   Alcohol use: Not Currently   Drug use: Not Currently    Types: Marijuana    Comment: Smoked for 30 years    Home Medications Prior to Admission medications   Medication Sig Start Date End Date Taking? Authorizing Provider  acetaminophen (TYLENOL) 500 MG tablet Take 1,000 mg by mouth every 6 (six) hours as needed for moderate pain or headache.    [provider]  apixaban (ELIQUIS) 5 MG TABS tablet Take 1 tablet (5 mg total) by mouth 2 (two) times daily. 08/12/19   Strader, Fransisco Hertz, PA-C  Buprenorphine HCl-Naloxone HCl 8-2 MG FILM Place 1 tablet under the tongue in the morning and at bedtime.     [provider]  eplerenone (INSPRA) 50 MG tablet Take 2 tablets (  100 mg total) by mouth daily. 06/25/20   Clegg, [name] D, NP  gabapentin (NEURONTIN) 800 MG tablet Take 1 tablet (800 mg total) by mouth 3 (three) times daily. 03/23/19   Angiulli, Lavon Paganini, PA-C  metolazone (ZAROXOLYN) 2.5 MG tablet Take 1 tablet (2.5 mg total) by mouth 2 (two) times a week. Monday and Fridays 09/26/20   Edwin Dada, MD  metoprolol succinate (TOPROL XL) 25 MG 24 hr tablet Take 1 tablet (25 mg total) by mouth 2 (two) times daily. 06/25/20 10/22/20  Larey Dresser, MD  ondansetron (ZOFRAN-ODT) 4 MG disintegrating tablet TAKE 1 TABLET BY MOUTH EVERY 8 HOURS AS NEEDED FOR NAUSEA OR VOMITING 10/21/20   Larey Dresser, MD  pantoprazole (PROTONIX) 40 MG tablet Take 40 mg by mouth 2 (two) times daily.    [provider]  polyethylene glycol powder (GLYCOLAX/MIRALAX) 17 GM/SCOOP powder Take 17 g by mouth daily as needed for mild constipation.    [provider]  potassium chloride SA (KLOR-CON) 20 MEQ tablet Take 4 tablets (80 mEq total) by mouth 2 (two) times  daily. 10/22/20   Larey Dresser, MD  pravastatin (PRAVACHOL) 20 MG tablet Take 1 tablet (20 mg total) by mouth daily. 03/23/19   Angiulli, Lavon Paganini, PA-C  senna (SENOKOT) 8.6 MG TABS tablet Take 2 tablets (17.2 mg total) by mouth daily. 06/13/20   [place] Donnamarie Poag, DO  sertraline (ZOLOFT) 25 MG tablet Take 1 tablet (25 mg total) by mouth daily. 06/25/20   Larey Dresser, MD  sodium chloride (OCEAN) 0.65 % SOLN nasal spray Place 1 spray into both nostrils as needed for congestion.    [provider]  tamsulosin (FLOMAX) 0.4 MG CAPS capsule Take 1 capsule (0.4 mg total) by mouth daily. 09/27/20   Danford, Suann Larry, MD  torsemide (DEMADEX) 100 MG tablet Take 1 tablet (100 mg total) by mouth 2 (two) times daily. 09/26/20   Danford, Suann Larry, MD  VENTOLIN HFA 108 (90 Base) MCG/ACT inhaler Inhale 1 puff into the lungs every 4 (four) hours as needed for shortness of breath. 03/23/19   Angiulli, Lavon Paganini, PA-C  zolpidem (AMBIEN) 10 MG tablet Take 10 mg by mouth at bedtime. 05/19/19   [provider]  bumetanide (BUMEX) 1 MG tablet Take 4 tablets (4 mg total) by mouth daily with breakfast AND 3 tablets (3 mg total) every evening. 05/10/20 06/13/20  Larey Dresser, MD  metoprolol tartrate (LOPRESSOR) 50 MG tablet Take 1 tablet (50 mg total) by mouth 2 (two) times daily. 02/09/20 04/11/20  Mercy Riding, MD  spironolactone (ALDACTONE) 100 MG tablet Take 1 tablet (100 mg total) by mouth daily. 02/10/20 02/27/20  Mercy Riding, MD    Allergies    Penicillins  Review of Systems   Review of Systems  All other systems reviewed and are negative.  Physical Exam Updated Vital Signs BP 117/74   Pulse 69   Temp 99.7 F (37.6 C) (Oral)   Resp 11   Ht 2.083 m (6' 10" )   Wt (!) 176.9 kg   SpO2 96%   BMI 40.78 kg/m   Physical Exam Vitals and nursing note reviewed.  Constitutional:      Appearance: He is well-developed. He is ill-appearing.     Comments: Elevated BMI  HENT:      Head: Normocephalic and atraumatic.     Right Ear: External ear normal.     Left Ear: External ear normal.  Eyes:  General: No scleral icterus.       Right eye: No discharge.        Left eye: No discharge.     Conjunctiva/sclera: Conjunctivae normal.  Neck:     Trachea: No tracheal deviation.  Cardiovascular:     Rate and Rhythm: Normal rate and regular rhythm.  Pulmonary:     Effort: Pulmonary effort is normal. No respiratory distress.     Breath sounds: Normal breath sounds. No stridor. No wheezing or rales.  Abdominal:     General: Bowel sounds are normal. There is no distension.     Palpations: Abdomen is soft.     Tenderness: There is no abdominal tenderness. There is no guarding or rebound.  Musculoskeletal:        General: No deformity.     Cervical back: Neck supple. Tenderness present.     Thoracic back: Normal.     Lumbar back: Normal.     Right lower leg: Edema present.     Left lower leg: Edema present.     Comments: Abrasion right big toe, tenderness palpation right shoulder  Skin:    General: Skin is warm and dry.     Findings: No rash.  Neurological:     General: No focal deficit present.     Mental Status: He is alert.     Cranial Nerves: No cranial nerve deficit (no facial droop, extraocular movements intact, no slurred speech).     Sensory: No sensory deficit.     Motor: No abnormal muscle tone or seizure activity.     Coordination: Coordination normal.  Psychiatric:        Mood and Affect: Mood normal.    ED Results / Procedures / Treatments   Labs (all labs ordered are listed, but only abnormal results are displayed) Labs Reviewed  CBC WITH DIFFERENTIAL/PLATELET - Abnormal; Notable for the following components:      Result Value   Hemoglobin 11.1 (*)    HCT 37.6 (*)    MCH 23.6 (*)    MCHC 29.5 (*)    RDW 16.8 (*)    Platelets 146 (*)    All other components within normal limits  BASIC METABOLIC PANEL - Abnormal; Notable for the following  components:   Chloride 97 (*)    Glucose, Bld 130 (*)    All other components within normal limits  BRAIN NATRIURETIC PEPTIDE  TROPONIN I (HIGH SENSITIVITY)  TROPONIN I (HIGH SENSITIVITY)    EKG EKG Interpretation  Date/Time:  Monday October 28 2020 13:54:11 EDT Ventricular Rate:  74 PR Interval:  170 QRS Duration: 116 QT Interval:  400 QTC Calculation: 444 R Axis:   -46 Text Interpretation: Sinus rhythm Left anterior fascicular block Low voltage, precordial leads No significant change since last tracing Confirmed by Deno Etienne [phone]) on 10/28/2020 3:44:09 PM  Radiology CT Head Wo Contrast  Result Date: 10/28/2020 CLINICAL DATA:  Provided history: Head trauma, coagulopathy. Level 2 fall on blood thinners. Neck trauma, dangerous injury mechanism. EXAM: CT HEAD WITHOUT CONTRAST CT CERVICAL SPINE WITHOUT CONTRAST TECHNIQUE: Multidetector CT imaging of the head and cervical spine was performed following the standard protocol without intravenous contrast. Multiplanar CT image reconstructions of the cervical spine were also generated. COMPARISON:  CT angiogram head/neck 10/12/2020. FINDINGS: CT HEAD FINDINGS Brain: Mild generalized parenchymal atrophy. There is no acute intracranial hemorrhage. No demarcated cortical infarct. No extra-axial fluid collection. No evidence of an intracranial mass. No midline shift. Partially empty sella turcica. Vascular:  No hyperdense vessel. Skull: Normal. Negative for fracture or focal lesion. Sinuses/Orbits: Visualized orbits show no acute finding. Trace bilateral ethmoid sinus mucosal thickening. Mild polypoid mucosal thickening within the inferior maxillary sinuses bilaterally. CT CERVICAL SPINE FINDINGS Alignment: Straightening of the expected cervical lordosis. No significant spondylolisthesis. Skull base and vertebrae: The basion-dental and atlanto-dental intervals are maintained.No evidence of acute fracture to the cervical spine. Soft tissues and spinal canal:  No prevertebral fluid or swelling. No visible canal hematoma. Disc levels: Cervical spondylosis with multilevel disc space narrowing, disc bulges, posterior disc osteophytes, endplate spurring, uncovertebral hypertrophy and facet arthrosis. Disc space narrowing is greatest at C4-C5, C5-C6, C6-C7 and C7-T1 (moderate/severe). Multilevel spinal canal stenosis. Most notably, there is no least moderate spinal canal stenosis at C4-C5 and C5-C6 and severe spinal canal stenosis at C6-C7. Multilevel bony neural foraminal narrowing. Multilevel ventral osteophytes. Upper chest: No consolidation within the imaged lung apices. No visible pneumothorax. IMPRESSION: CT head: 1. No evidence of acute intracranial abnormality. 2. Mild generalized parenchymal atrophy. 3. Paranasal sinus disease, as described. CT cervical spine: 1. No evidence of acute fracture to the cervical spine. 2. Straightening of the expected cervical lordosis. 3. Cervical spondylosis, as outlined. Notably, there is suspected at least moderate spinal canal stenosis at C4-C5 and C5-C6, and apparent severe spinal canal stenosis at C6-C7. Electronically Signed   By: Kellie Simmering DO   On: 10/28/2020 15:19   CT Cervical Spine Wo Contrast  Result Date: 10/28/2020 CLINICAL DATA:  Provided history: Head trauma, coagulopathy. Level 2 fall on blood thinners. Neck trauma, dangerous injury mechanism. EXAM: CT HEAD WITHOUT CONTRAST CT CERVICAL SPINE WITHOUT CONTRAST TECHNIQUE: Multidetector CT imaging of the head and cervical spine was performed following the standard protocol without intravenous contrast. Multiplanar CT image reconstructions of the cervical spine were also generated. COMPARISON:  CT angiogram head/neck 10/12/2020. FINDINGS: CT HEAD FINDINGS Brain: Mild generalized parenchymal atrophy. There is no acute intracranial hemorrhage. No demarcated cortical infarct. No extra-axial fluid collection. No evidence of an intracranial mass. No midline shift. Partially  empty sella turcica. Vascular: No hyperdense vessel. Skull: Normal. Negative for fracture or focal lesion. Sinuses/Orbits: Visualized orbits show no acute finding. Trace bilateral ethmoid sinus mucosal thickening. Mild polypoid mucosal thickening within the inferior maxillary sinuses bilaterally. CT CERVICAL SPINE FINDINGS Alignment: Straightening of the expected cervical lordosis. No significant spondylolisthesis. Skull base and vertebrae: The basion-dental and atlanto-dental intervals are maintained.No evidence of acute fracture to the cervical spine. Soft tissues and spinal canal: No prevertebral fluid or swelling. No visible canal hematoma. Disc levels: Cervical spondylosis with multilevel disc space narrowing, disc bulges, posterior disc osteophytes, endplate spurring, uncovertebral hypertrophy and facet arthrosis. Disc space narrowing is greatest at C4-C5, C5-C6, C6-C7 and C7-T1 (moderate/severe). Multilevel spinal canal stenosis. Most notably, there is no least moderate spinal canal stenosis at C4-C5 and C5-C6 and severe spinal canal stenosis at C6-C7. Multilevel bony neural foraminal narrowing. Multilevel ventral osteophytes. Upper chest: No consolidation within the imaged lung apices. No visible pneumothorax. IMPRESSION: CT head: 1. No evidence of acute intracranial abnormality. 2. Mild generalized parenchymal atrophy. 3. Paranasal sinus disease, as described. CT cervical spine: 1. No evidence of acute fracture to the cervical spine. 2. Straightening of the expected cervical lordosis. 3. Cervical spondylosis, as outlined. Notably, there is suspected at least moderate spinal canal stenosis at C4-C5 and C5-C6, and apparent severe spinal canal stenosis at C6-C7. Electronically Signed   By: Kellie Simmering DO   On: 10/28/2020 15:19   DG  Chest Port 1 View  Result Date: 10/28/2020 CLINICAL DATA:  Recent syncopal episode with chest pain, initial encounter EXAM: PORTABLE CHEST 1 VIEW COMPARISON:  10/12/2020  FINDINGS: Cardiac shadow is stable. Lungs are well aerated bilaterally. No focal infiltrate or effusion is seen. No acute bony abnormality is noted. IMPRESSION: No active disease. Electronically Signed   By: Inez Catalina M.D.   On: 10/28/2020 14:34    Procedures Procedures   Medications Ordered in ED Medications - No data to display  ED Course  I have reviewed the triage vital signs and the nursing notes.  Pertinent labs & imaging results that were available during my care of the patient were reviewed by me and considered in my medical decision making (see chart for details).  Clinical Course as of 10/28/20 1605  Mon Oct 28, 2020  1524 Chest x-ray negative [JK]  1524 Head and C-spine CT without acute fracture [JK]    Clinical Course User Index [JK] Dorie Rank, MD   MDM Rules/Calculators/A&P                          Patient presented to the ED for evaluation of a syncopal episode and fall.  Patient has been having increasing shortness of breath and peripheral edema.  He does have CHF.  Physical exam is suggestive of right-sided heart failure.  Labs do not show signs of acute anemia.  No signs of acute kidney injury.  Initial cardiac enzymes are normal.  Head CT and C-spine CT did not show any signs of acute injury or stroke.  X-ray shoulder and foot are pending.  Anticipate patient will need be admitted for his syncopal event and probable right-sided heart failure.  Case discussed with Dr. Final Clinical Impression(s) / ED Diagnoses Final diagnoses:  Syncope, unspecified syncope type  Peripheral edema     Dorie Rank, MD 10/28/20 1606

## 2020-10-28 NOTE — Progress Notes (Signed)
Orthopedic Tech Progress Note Patient Details:  Charles Gray [birth year] [id number]  Level 2 trauma  Patient ID: [name], male   DOB: 08/18/1955, 65 y.o.   MRN: [id number]  Janit Pagan 10/28/2020, 1:59 PM

## 2020-10-28 NOTE — ED Notes (Signed)
Patient transported to X-ray 

## 2020-10-28 NOTE — ED Notes (Signed)
Pt in room on the phone with tears in his eyes. Updated pt that Ambien order was only for 51m. Will consult MD to change order. Pt given meal bag and drink. [place] requested.

## 2020-10-28 NOTE — ED Provider Notes (Signed)
I received the patient in signout from Dr. Tomi Bamberger, briefly the patient is a 65 year old male with a chief complaint of a syncopal event.  Thought to be high risk syncope based on his history of heart failure and is clinically fluid overloaded.  Patient had fallen and struck his head and is on blood thinning medicines and so he was arrived as a level 2 trauma.  CT imaging is unremarkable.  Awaiting plain films of his shoulder and foot.  These have returned and are also unremarkable.  Will discuss with medicine for admission.   Deno Etienne, DO 10/28/20 1704

## 2020-10-28 NOTE — ED Notes (Signed)
Patient transported to CT 

## 2020-10-28 NOTE — Consult Note (Addendum)
Cardiology Consultation:   Patient ID: Charles Gray MRN: [id number]; DOB: 1956-03-08  Admit date: 10/28/2020 Date of Consult: 10/28/2020  PCP:  Enid Skeens., MD   [facility] Providers Cardiologist:  Berniece Salines, DO  Heart Failure: Dr. [name]   Patient Profile:   Charles Gray is a 65 y.o. 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 Roosevelt Locks.  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 He was diagnosed with DVT and PE in February 2021 on CTA.  Eliquis was started at the time.  Since then, he has been to the hospital multiple times due to heart failure.  Echocardiogram performed in April 2021 showed EF 55 to 60%, grade 1 DD, RV was not very well visualized.  Right heart cath in April 2021 showed a normal PA pressure, normal cardiac output.  PFT showed mild obstructive airway disease.  Coronary CTA in July 2021 showed no coronary calcium however there was concern for soft plaque in the distal left circumflex and the distal RCA, FFR showed significant disease in both area.  He was managed medically given lack of anginal symptoms.  He had dizziness on ranolazine.  Repeat echocardiogram in December 2021 continue to show normal EF of 60 to 65%, grade 2 DD, poorly visualized RV.  CardioMEMS was discussed with the patient however he was reluctant to start.  He was placed on metolazone twice weekly dosing, metoprolol was switched to Toprol-XL. Jardiance was not tolerated due to GI upset.   Left and right heart cath performed on 05/10/2020 showed no obstructive CAD, elevated right greater than left heart filling pressures suggestive of primarily RV failure.  He was on Bumex at the time, this was later switched back to torsemide.  He has been followed closely as outpatient by heart failure per medicine who will set up his pillbox.  Last admission was on 09/20/2020 for shortness of breath and the leg swelling secondary to right heart failure.  He underwent diuresis with removal of 5 L, discharge weight was 371 pounds.  His last visit with heart failure service was on 09/30/2020.  Baseline dry weight was 370 pounds at home.  He was on torsemide 100 mg twice a day with metolazone twice weekly on Monday and Friday, potassium 60 mg twice a day dosing, eplerenone 100 mg daily.  Since the last visit, he has been back to the clinic both on 7/6 and 7/7 to receive IV Lasix through peripheral IVs.  In the past few days, he has been noticing increasing lower extremity edema, increasing weakness, dizziness, fatigue, and shortness of breath.  He does admit he will occasionally drink more than 2 L of fluid.  He says he do not use any salt since last year.  He is weight based on home scale has increased to 383 pounds which is 13 pounds higher than the home dry weight.  He also endorses increased abdominal distention as well.  This morning, while walking toward his door, he says his leg feels very heavy and that he was dizzy at the same time, this  caused him to fell and hit his right shoulder onto the door.  This also caused his right toe to bleed although he is not sure how he injured his toe during the fall.  He sought medical [facility] at the [facility].   Past Medical History:  Diagnosis Date   Acute on chronic congestive heart failure (HCC)    Acute on chronic diastolic CHF (congestive heart failure) ([place]) 08/23/2019   Acute on chronic right-sided heart failure ([place]) 08/06/2019   Acute respiratory  failure with hypoxia (HCC)    Anemia of chronic disease    Atrial fibrillation ([place]) 12/23/2019   Atrial tachycardia ([place]) 08/12/2019   Benign essential HTN    Chest pain 08/23/2019   Chronic anticoagulation 12/26/2019   Chronic heart failure with preserved EF 05/30/2019   Echo 07/2019: EF 55-60, GR 1 DD   Chronic pain syndrome    Cirrhosis of liver without ascites ([place])    Class 2 severe obesity due to excess calories with serious comorbidity and body mass index (BMI) of 39.0 to 39.9 in adult ([place]) 05/30/2019   Debility 03/13/2019   Drug induced constipation    Dyspnea    Fatty liver disease, nonalcoholic    GIB (gastrointestinal bleeding) 03/04/2019   Heart failure, systolic, acute ([place]) 1/51/7616   History of pulmonary embolism 08/12/2019   Hyperlipidemia 01/24/2019   Hypertension 01/24/2019   Hypoalbuminemia due to protein-calorie malnutrition (HCC)    Hypokalemia    Hyponatremia    Hypotension due to drugs    Insomnia 01/24/2019   Lactic acidosis 08/23/2019   Lumbar disc herniation 01/24/2019   Mixed hyperlipidemia 01/24/2019   Morbid obesity with BMI of 40.0-44.9, adult ([place]) 05/30/2019   Normocytic anemia 06/08/2019   Occasional tremors 08/12/2019   OSA (obstructive sleep apnea) 08/12/2019   Palpitations 12/26/2019   Peripheral edema    Personal history of DVT (deep vein thrombosis) 12/26/2019   Portal hypertensive gastropathy ([place]) 03/06/2019   Seen on EGD 03/06/2019   Pulmonary emboli ([place]) 06/08/2019   Pulmonary embolism ([place]) 06/08/2019   Redness and swelling of lower leg 08/23/2019   Shock ([place])    Thrombocytopenia ([place])     Past Surgical History:  Procedure Laterality Date   ESOPHAGOGASTRODUODENOSCOPY (EGD) WITH PROPOFOL N/A 03/05/2019   Procedure: ESOPHAGOGASTRODUODENOSCOPY (EGD) WITH PROPOFOL;  Surgeon: Doran Stabler, MD;  Location: [place];  Service: Gastroenterology;  Laterality: N/A;  Large Blood Clot Removed   ESOPHAGOGASTRODUODENOSCOPY (EGD) WITH PROPOFOL N/A  03/06/2019   Procedure: ESOPHAGOGASTRODUODENOSCOPY (EGD) WITH PROPOFOL;  Surgeon: Thornton Park, MD;  Location: [place];  Service: Gastroenterology;  Laterality: N/A;   HEMORROIDECTOMY     RIGHT HEART CATH N/A 08/09/2019   Procedure: RIGHT HEART CATH;  Surgeon: Larey Dresser, MD;  Location: [place] [facility];  Service: Cardiovascular;  Laterality: N/A;   RIGHT/LEFT HEART CATH AND CORONARY ANGIOGRAPHY N/A 05/10/2020   Procedure: RIGHT/LEFT HEART CATH AND CORONARY ANGIOGRAPHY;  Surgeon: Larey Dresser, MD;  Location: [place] [facility];  Service: Cardiovascular;  Laterality: N/A;     Home Medications:  Prior to Admission medications   Medication Sig Start Date End Date Taking? Authorizing Provider  acetaminophen (TYLENOL) 500 MG tablet Take 1,000 mg by mouth every 6 (six) hours as needed for moderate pain or headache.    [provider]  apixaban (ELIQUIS) 5 MG TABS tablet Take 1 tablet (5 mg total) by mouth 2 (two) times daily. 08/12/19   Erma Heritage, PA-C  Buprenorphine HCl-Naloxone HCl 8-2 MG FILM Place 1 tablet under the tongue in the morning and at bedtime.     [provider]  eplerenone (INSPRA) 50 MG tablet Take 2 tablets (100 mg total) by mouth daily. 06/25/20   Clegg, [name] D, NP  gabapentin (NEURONTIN) 800 MG tablet Take 1 tablet (800 mg total) by mouth 3 (three) times daily. 03/23/19   Angiulli, Lavon Paganini, PA-C  metolazone (ZAROXOLYN) 2.5 MG tablet Take 1 tablet (2.5 mg total) by mouth 2 (two) times a week. Monday and Fridays 09/26/20   Edwin Dada, MD  metoprolol succinate (TOPROL XL) 25 MG 24 hr tablet Take 1 tablet (25 mg total) by mouth 2 (two) times daily. 06/25/20 10/22/20  Larey Dresser, MD  ondansetron (ZOFRAN-ODT) 4 MG disintegrating tablet TAKE 1 TABLET BY MOUTH EVERY 8 HOURS AS NEEDED FOR NAUSEA OR VOMITING 10/21/20   Larey Dresser, MD  pantoprazole (PROTONIX) 40 MG tablet Take 40 mg by mouth 2 (two) times daily.    [provider]  polyethylene glycol powder (GLYCOLAX/MIRALAX) 17 GM/SCOOP powder Take 17 g by mouth daily as needed for mild constipation.    [provider]  potassium chloride SA (KLOR-CON) 20 MEQ tablet Take 4 tablets (80 mEq total) by mouth 2 (two) times daily. 10/22/20   Larey Dresser, MD  pravastatin (PRAVACHOL) 20 MG tablet Take 1 tablet (20 mg total) by mouth daily. 03/23/19   Angiulli, Lavon Paganini, PA-C  senna (SENOKOT) 8.6 MG TABS tablet Take 2 tablets (17.2 mg total) by mouth daily. 06/13/20   [place] Donnamarie Poag, DO  sertraline (ZOLOFT) 25 MG tablet Take 1 tablet (25 mg total) by mouth daily. 06/25/20   Larey Dresser, MD  sodium chloride (OCEAN) 0.65 % SOLN nasal spray Place 1 spray into both nostrils as needed for congestion.    [provider]  tamsulosin (FLOMAX) 0.4 MG CAPS capsule Take 1 capsule (0.4 mg total) by mouth daily. 09/27/20   Danford, Suann Larry, MD  torsemide (DEMADEX) 100 MG tablet Take 1 tablet (100 mg total) by mouth 2 (two) times daily. 09/26/20   Danford, Suann Larry, MD  VENTOLIN HFA 108 (90 Base) MCG/ACT inhaler Inhale 1 puff into the lungs every 4 (four) hours as needed for shortness of breath. 03/23/19   Angiulli, Lavon Paganini, PA-C  zolpidem (AMBIEN) 10 MG tablet Take 10 mg by mouth at bedtime. 05/19/19   [provider]  bumetanide (BUMEX) 1 MG tablet Take 4 tablets (4 mg total) by mouth daily with breakfast AND 3 tablets (3 mg total) every evening. 05/10/20 06/13/20  Larey Dresser, MD  metoprolol tartrate (LOPRESSOR) 50 MG tablet Take 1 tablet (50 mg total) by mouth 2 (two) times daily. 02/09/20 04/11/20  Mercy Riding, MD  spironolactone (ALDACTONE) 100 MG tablet Take 1 tablet (100 mg total) by mouth daily. 02/10/20 02/27/20  Mercy Riding, MD    Inpatient Medications: Scheduled Meds:  apixaban  5 mg Oral BID   Buprenorphine HCl-Naloxone HCl  1 Film Sublingual BID   [START ON 10/29/2020] furosemide  60 mg Intravenous BID   gabapentin   800 mg Oral TID   metolazone  2.5 mg Oral Daily   metoprolol succinate  25 mg Oral BID   pantoprazole  40 mg Oral BID   potassium chloride SA  80 mEq Oral BID   [START ON 10/29/2020] pravastatin  20 mg Oral Daily   [START ON 10/29/2020] senna  2  tablet Oral Daily   [START ON 10/29/2020] sertraline  25 mg Oral Daily   sodium chloride flush  3 mL Intravenous Q12H   [START ON 10/29/2020] spironolactone  25 mg Oral Daily   [START ON 10/29/2020] tamsulosin  0.4 mg Oral Daily   zolpidem  5 mg Oral QHS   Continuous Infusions:  PRN Meds: acetaminophen, albuterol, ondansetron, polyethylene glycol, sodium chloride  Allergies:    Allergies  Allergen Reactions   Penicillins     Broke out in convulsions as a child  Did it involve swelling of the face/tongue/throat, SOB, or low BP? N/A Did it involve sudden or severe rash/hives, skin peeling, or any reaction on the inside of your mouth or nose? N/A Did you need to seek medical attention at a hospital or doctor's office?N/A When did it last happen? N/A If all above answers are "NO", may proceed with cephalosporin use.    Social History:   Social History   Socioeconomic History   Marital status: Single    Spouse name: Not on file   Number of children: Not on file   Years of education: Not on file   Highest education level: Not on file  Occupational History   Not on file  Tobacco Use   Smoking status: Never   Smokeless tobacco: Never  Vaping Use   Vaping Use: Never used  Substance and Sexual Activity   Alcohol use: Not Currently   Drug use: Not Currently    Types: Marijuana    Comment: Smoked for 30 years   Sexual activity: Not on file  Other Topics Concern   Not on file  Social History Narrative   Not on file   Social Determinants of Health   Financial Resource Strain: Medium Risk   Difficulty of Paying Living Expenses: Somewhat hard  Food Insecurity: No Food Insecurity   Worried About Running Out of Food in the Last Year:  Never true   Ran Out of Food in the Last Year: Never true  Transportation Needs: Unmet Transportation Needs   Lack of Transportation (Medical): Yes   Lack of Transportation (Non-Medical): No  Physical Activity: Not on file  Stress: Not on file  Social Connections: Not on file  Intimate Partner Violence: Not on file    Family History:    Family History  Problem Relation Age of Onset   Hyperlipidemia Mother    Hypertension Mother    Stroke Mother    CAD Maternal Grandmother    CAD Maternal Grandfather      ROS:  Please see the history of present illness.   All other ROS reviewed and negative.     Physical Exam/Data:   Vitals:   10/28/20 1650 10/28/20 1715 10/28/20 1730 10/28/20 1800  BP: 111/71 111/64 104/65 118/70  Pulse: 64 65 (!) 57 66  Resp: 14 14 14 16   Temp:      TempSrc:      SpO2: 96% 95% 97% 99%  Weight:      Height:        Intake/Output Summary (Last 24 hours) at 10/28/2020 1934 Last data filed at 10/28/2020 1729 Gross per 24 hour  Intake --  Output 450 ml  Net -450 ml   Last 3 Weights 10/28/2020 10/22/2020 10/17/2020  Weight (lbs) 390 lb 376 lb 380 lb 6.4 oz  Weight (kg) 176.903 kg 170.552 kg 172.548 kg     Body mass index is 40.78 kg/m.  General:  morbid obese HEENT: normal Lymph: no  adenopathy Neck: no JVD Endocrine:  No thryomegaly Vascular: No carotid bruits; FA pulses 2+ bilaterally without bruits  Cardiac:  normal S1, S2; RRR; no murmur  Lungs:  clear to auscultation bilaterally, no wheezing, rhonchi or rales  Abd: soft, nontender, no hepatomegaly  Ext: 2-3+ edema Musculoskeletal:  No deformities, BUE and BLE strength normal and equal Skin: warm and dry  Neuro:  CNs 2-12 intact, no focal abnormalities noted Psych:  Normal affect   EKG:  The EKG was personally reviewed and demonstrates:  sinus rhythm with poor R wave progression in the anterior leads Telemetry:  Telemetry was personally reviewed and demonstrates:  currently off of tele at  the time of interview, but previous tele shows sinus rhythm without significant ventricular ectopy  Relevant CV Studies:  Echo 03/25/2020  1. Left ventricular ejection fraction, by estimation, is 60 to 65%. The  left ventricle has normal function. The left ventricle has no regional  wall motion abnormalities. Left ventricular diastolic parameters are  consistent with Grade II diastolic  dysfunction (pseudonormalization).   2. Right ventricular systolic function was not well visualized. The right  ventricular size is not well visualized.   3. The mitral valve was not well visualized.   4. The aortic valve was not well visualized. Aortic valve regurgitation  is not visualized. No aortic stenosis is present.   5. Aortic dilatation noted. There is mild dilatation of the aortic root,  measuring 38 mm.   6. Very poor echo images. LV marginally visualized, EF appears normal.    Cath 05/10/2020 1. No obstructive CAD. 2. Elevated R>L heart filling pressures, suggests primarily RV failure.   I will increase bumetanide to 4 mg qam/3 mg qpm.  Laboratory Data:  High Sensitivity Troponin:   Recent Labs  Lab 10/12/20 0615 10/12/20 0754 10/28/20 1338 10/28/20 1539  TROPONINIHS 5 4 6 6      Chemistry Recent Labs  Lab 10/28/20 1338  NA 137  K 3.8  CL 97*  CO2 31  GLUCOSE 130*  BUN 10  CREATININE 0.92  CALCIUM 9.3  GFRNONAA >60  ANIONGAP 9    No results for input(s): PROT, ALBUMIN, AST, ALT, ALKPHOS, BILITOT in the last 168 hours. Hematology Recent Labs  Lab 10/28/20 1338  WBC 7.4  RBC 4.70  HGB 11.1*  HCT 37.6*  MCV 80.0  MCH 23.6*  MCHC 29.5*  RDW 16.8*  PLT 146*   BNP Recent Labs  Lab 10/28/20 1338  BNP 28.1    DDimer No results for input(s): DDIMER in the last 168 hours.   Radiology/Studies:  DG Shoulder Right  Result Date: 10/28/2020 CLINICAL DATA:  Syncopal episode fall EXAM: RIGHT SHOULDER - 2+ VIEW COMPARISON:  None. FINDINGS: Moderate AC joint  degenerative change. No fracture or malalignment. Slightly high-riding appearance of the humeral head as may be seen with rotator cuff disease. IMPRESSION: No acute osseous abnormality. Electronically Signed   By: Donavan Foil M.D.   On: 10/28/2020 16:54   CT Head Wo Contrast  Result Date: 10/28/2020 CLINICAL DATA:  Provided history: Head trauma, coagulopathy. Level 2 fall on blood thinners. Neck trauma, dangerous injury mechanism. EXAM: CT HEAD WITHOUT CONTRAST CT CERVICAL SPINE WITHOUT CONTRAST TECHNIQUE: Multidetector CT imaging of the head and cervical spine was performed following the standard protocol without intravenous contrast. Multiplanar CT image reconstructions of the cervical spine were also generated. COMPARISON:  CT angiogram head/neck 10/12/2020. FINDINGS: CT HEAD FINDINGS Brain: Mild generalized parenchymal atrophy. There is no acute intracranial  hemorrhage. No demarcated cortical infarct. No extra-axial fluid collection. No evidence of an intracranial mass. No midline shift. Partially empty sella turcica. Vascular: No hyperdense vessel. Skull: Normal. Negative for fracture or focal lesion. Sinuses/Orbits: Visualized orbits show no acute finding. Trace bilateral ethmoid sinus mucosal thickening. Mild polypoid mucosal thickening within the inferior maxillary sinuses bilaterally. CT CERVICAL SPINE FINDINGS Alignment: Straightening of the expected cervical lordosis. No significant spondylolisthesis. Skull base and vertebrae: The basion-dental and atlanto-dental intervals are maintained.No evidence of acute fracture to the cervical spine. Soft tissues and spinal canal: No prevertebral fluid or swelling. No visible canal hematoma. Disc levels: Cervical spondylosis with multilevel disc space narrowing, disc bulges, posterior disc osteophytes, endplate spurring, uncovertebral hypertrophy and facet arthrosis. Disc space narrowing is greatest at C4-C5, C5-C6, C6-C7 and C7-T1 (moderate/severe).  Multilevel spinal canal stenosis. Most notably, there is no least moderate spinal canal stenosis at C4-C5 and C5-C6 and severe spinal canal stenosis at C6-C7. Multilevel bony neural foraminal narrowing. Multilevel ventral osteophytes. Upper chest: No consolidation within the imaged lung apices. No visible pneumothorax. IMPRESSION: CT head: 1. No evidence of acute intracranial abnormality. 2. Mild generalized parenchymal atrophy. 3. Paranasal sinus disease, as described. CT cervical spine: 1. No evidence of acute fracture to the cervical spine. 2. Straightening of the expected cervical lordosis. 3. Cervical spondylosis, as outlined. Notably, there is suspected at least moderate spinal canal stenosis at C4-C5 and C5-C6, and apparent severe spinal canal stenosis at C6-C7. Electronically Signed   By: Kellie Simmering DO   On: 10/28/2020 15:19   CT Cervical Spine Wo Contrast  Result Date: 10/28/2020 CLINICAL DATA:  Provided history: Head trauma, coagulopathy. Level 2 fall on blood thinners. Neck trauma, dangerous injury mechanism. EXAM: CT HEAD WITHOUT CONTRAST CT CERVICAL SPINE WITHOUT CONTRAST TECHNIQUE: Multidetector CT imaging of the head and cervical spine was performed following the standard protocol without intravenous contrast. Multiplanar CT image reconstructions of the cervical spine were also generated. COMPARISON:  CT angiogram head/neck 10/12/2020. FINDINGS: CT HEAD FINDINGS Brain: Mild generalized parenchymal atrophy. There is no acute intracranial hemorrhage. No demarcated cortical infarct. No extra-axial fluid collection. No evidence of an intracranial mass. No midline shift. Partially empty sella turcica. Vascular: No hyperdense vessel. Skull: Normal. Negative for fracture or focal lesion. Sinuses/Orbits: Visualized orbits show no acute finding. Trace bilateral ethmoid sinus mucosal thickening. Mild polypoid mucosal thickening within the inferior maxillary sinuses bilaterally. CT CERVICAL SPINE FINDINGS  Alignment: Straightening of the expected cervical lordosis. No significant spondylolisthesis. Skull base and vertebrae: The basion-dental and atlanto-dental intervals are maintained.No evidence of acute fracture to the cervical spine. Soft tissues and spinal canal: No prevertebral fluid or swelling. No visible canal hematoma. Disc levels: Cervical spondylosis with multilevel disc space narrowing, disc bulges, posterior disc osteophytes, endplate spurring, uncovertebral hypertrophy and facet arthrosis. Disc space narrowing is greatest at C4-C5, C5-C6, C6-C7 and C7-T1 (moderate/severe). Multilevel spinal canal stenosis. Most notably, there is no least moderate spinal canal stenosis at C4-C5 and C5-C6 and severe spinal canal stenosis at C6-C7. Multilevel bony neural foraminal narrowing. Multilevel ventral osteophytes. Upper chest: No consolidation within the imaged lung apices. No visible pneumothorax. IMPRESSION: CT head: 1. No evidence of acute intracranial abnormality. 2. Mild generalized parenchymal atrophy. 3. Paranasal sinus disease, as described. CT cervical spine: 1. No evidence of acute fracture to the cervical spine. 2. Straightening of the expected cervical lordosis. 3. Cervical spondylosis, as outlined. Notably, there is suspected at least moderate spinal canal stenosis at C4-C5 and C5-C6, and apparent  severe spinal canal stenosis at C6-C7. Electronically Signed   By: Kellie Simmering DO   On: 10/28/2020 15:19   DG Chest Port 1 View  Result Date: 10/28/2020 CLINICAL DATA:  Recent syncopal episode with chest pain, initial encounter EXAM: PORTABLE CHEST 1 VIEW COMPARISON:  10/12/2020 FINDINGS: Cardiac shadow is stable. Lungs are well aerated bilaterally. No focal infiltrate or effusion is seen. No acute bony abnormality is noted. IMPRESSION: No active disease. Electronically Signed   By: Inez Catalina M.D.   On: 10/28/2020 14:34   DG Foot Complete Right  Result Date: 10/28/2020 CLINICAL DATA:  Syncope,  fall EXAM: RIGHT FOOT COMPLETE - 3+ VIEW COMPARISON:  None. FINDINGS: Bones appear osteopenic. Diffuse soft tissue swelling. Acute intra-articular fracture at the base of the first proximal phalanx. No subluxation IMPRESSION: Acute nondisplaced intra-articular fracture at the base of the first proximal phalanx Electronically Signed   By: Donavan Foil M.D.   On: 10/28/2020 16:56     Assessment and Plan:   Acute on chronic RV failure  -Baseline dry weight at home was 370 pounds, yesterday's weight was 383 pounds.  -Patient will need admission and undergo IV diuresis.  Use 80 mg twice a day of IV Lasix.  -His BNP is normal at 28.1, this is likely falsely low given his morbid obesity.  BNP has been low and normal during the previous hospitalization for RV failure as well.  -Ideally, CardioMEMS discussion need to be held with the patient again.  May also need to consider subcu lasix at home (despite the fact he takes torsemide 154m BID at home, during previous admission, he actually respond to IV lasix 833mBID very well. Subcu lasix may offer similar bioavailability compare to IV lasix).  Unfortunately, without drastic weight loss, he likely will continue to have recurrent hospital admissions and poor outcomes.   Fall: Related to significant volume overload and inability to get around very easily.  He did have some dizziness as well.  Unclear if true syncopal episode  COPD: No acute exacerbation  History of DVT/PE in February 2021 on chronic Eliquis  Obstructive sleep apnea intolerant of CPAP therapy  Paroxysmal atrial fibrillation: On Eliquis.  Maintaining sinus rhythm.  Hypertension    Risk Assessment/Risk Scores:        New York Heart Association (NYHA) Functional Class NYHA Class IV  CHA2DS2-VASc Score = 2  This indicates a 2.2% annual risk of stroke. The patient's score is based upon: CHF History: Yes HTN History: Yes Diabetes History: No Stroke History: No Vascular Disease  History: No Age Score: 0 Gender Score: 0      For questions or updates, please contact [facility] consult [email or web address] for contact info under    SiHilbert CorriganPAUtah7/18/2022 7:34 PM   Patient seen and examined.  Agree with above documentation.  Charles Gray a [age]ear old male with a history of chronic diastolic heart failure, right heart failure, OSA unable to tolerate CPAP, paroxysmal atrial fibrillation, DVT/PE, COPD who we are consulted by Dr. ZhRoosevelt Locksor evaluation of heart failure.  He has had multiple admissions over the last year with decompensated heart failure, most recently on 09/20/2020.  Weight at discharge was 371 pounds.  He was discharged on torsemide 100 mg twice daily with metolazone twice weekly, eplerenone 100 mg daily, potassium 60 mg twice daily.  He received IV Lasix in CHF clinic on 7/6 and 7/7.  Over the last few days, he has noted worsening lower  extremity edema, weakness, and dyspnea.  Today reports he got up to go to the bathroom and legs felt heavy, and he fell to the ground onto his right shoulder.  Unclear if loss consciousness.  This prompted him to present to ED today.  Weight on presentation is 390 pounds.  Initial vital signs notable for BP 128/76, pulse 75, SPO2 95% on room air.  Labs notable for sodium 137, potassium 3.8, creatinine 0.9, BNP 28, troponin 6 > 6, hemoglobin 11.1, platelets 146, WBC 7.4, COVID-19 negative.  Chest x-ray unremarkable.  Head CT unremarkable.  EKG shows sinus rhythm, rate 74, low voltage, motion artifact, poor R wave progression.  Telemetry shows NSR with rate 60s.  On exam, patient is alert and oriented, regular rate and rhythm, no murmurs, lungs CTAB, 1+ LE edema, JVD difficult to assess given body habitus.  For his heart failure, difficult to assess volume status given body habitus but his weight is up 19 pounds from discharge last month after IV diuresis.  On previous admissions he diuresed well with IV Lasix 80 mg  twice daily, will start.  In regards to his fall, unclear if true syncopal episode.  Continue to monitor on telemetry.  Will check echo.  Donato Heinz, MD

## 2020-10-28 NOTE — ED Notes (Signed)
Attempt at blood draw unsuccessful,  PIV x1 in place

## 2020-10-28 NOTE — ED Triage Notes (Signed)
Arrived via EMS; stated he fell around 10am and passed out for about 20 mins before he was able to get up.stated he is on eliquis because he's a heart patient. C/O right shoulder, knee and big toe pain. Patient also stated "my nurse wants me to let you know that I've gain some weigth 380 lbs from 369 lbs."

## 2020-10-28 NOTE — ED Notes (Signed)
Pt remains in xray.

## 2020-10-28 NOTE — ED Notes (Signed)
IV team at bedside 

## 2020-10-28 NOTE — Progress Notes (Signed)
Orthopedic Tech Progress Note Patient Details:  [name] 08-Aug-1955 [id number]  Ortho Devices Type of Ortho Device: Buddy tape Ortho Device/Splint Location: rle 1st and second toes Ortho Device/Splint Interventions: Ordered, Application, Adjustment   Post Interventions Patient Tolerated: Well Instructions Provided: Care of device, Adjustment of device  Karolee Stamps 10/28/2020, 10:51 PM

## 2020-10-28 NOTE — ED Notes (Signed)
Per dr Tomi Bamberger ok to downgrade, pt stable

## 2020-10-29 ENCOUNTER — Inpatient Hospital Stay (HOSPITAL_COMMUNITY): Payer: Medicare HMO

## 2020-10-29 DIAGNOSIS — I10 Essential (primary) hypertension: Secondary | ICD-10-CM

## 2020-10-29 DIAGNOSIS — I5033 Acute on chronic diastolic (congestive) heart failure: Secondary | ICD-10-CM | POA: Diagnosis not present

## 2020-10-29 DIAGNOSIS — Z6841 Body Mass Index (BMI) 40.0 and over, adult: Secondary | ICD-10-CM

## 2020-10-29 DIAGNOSIS — I480 Paroxysmal atrial fibrillation: Secondary | ICD-10-CM

## 2020-10-29 DIAGNOSIS — G4733 Obstructive sleep apnea (adult) (pediatric): Secondary | ICD-10-CM

## 2020-10-29 LAB — BASIC METABOLIC PANEL
Anion gap: 10 (ref 5–15)
BUN: 10 mg/dL (ref 8–23)
CO2: 29 mmol/L (ref 22–32)
Calcium: 9.5 mg/dL (ref 8.9–10.3)
Chloride: 98 mmol/L (ref 98–111)
Creatinine, Ser: 0.9 mg/dL (ref 0.61–1.24)
GFR, Estimated: >60 mL/min (ref >60)
Glucose, Bld: 104 mg/dL — ABNORMAL HIGH (ref 70–99)
Potassium: 3.9 mmol/L (ref 3.5–5.1)
Sodium: 137 mmol/L (ref 135–145)

## 2020-10-29 LAB — CBG MONITORING, ED: Glucose-Capillary: 163 mg/dL — ABNORMAL HIGH (ref 70–99)

## 2020-10-29 LAB — ECHOCARDIOGRAM COMPLETE
AR max vel: 2.71 cm2
AV Area VTI: 2.92 cm2
AV Area mean vel: 2.82 cm2
AV Mean grad: 4 mmHg
AV Peak grad: 7.1 mmHg
Ao pk vel: 1.33 m/s
Area-P 1/2: 3.48 cm2
Height: 82 in
S' Lateral: 3.65 cm
Weight: 6240 oz

## 2020-10-29 LAB — PHOSPHORUS: Phosphorus: 3.8 mg/dL (ref 2.5–4.6)

## 2020-10-29 LAB — MAGNESIUM: Magnesium: 2.4 mg/dL (ref 1.7–2.4)

## 2020-10-29 MED ORDER — ZOLPIDEM TARTRATE 5 MG PO TABS: 5.0000 mg | ORAL_TABLET | Freq: Every day | ORAL | Status: DC

## 2020-10-29 MED ORDER — KETOROLAC TROMETHAMINE 15 MG/ML IJ SOLN: 15.0000 mg | Freq: Once | INTRAMUSCULAR | Status: DC

## 2020-10-29 MED ORDER — PERFLUTREN LIPID MICROSPHERE
INTRAVENOUS | Status: AC
  Administered 2020-10-29: 2 mL via INTRAVENOUS
  Filled 2020-10-29: qty 10

## 2020-10-29 MED ORDER — NON FORMULARY: Freq: Every day | Status: DC

## 2020-10-29 MED ORDER — EPLERENONE 25 MG PO TABS
100.0000 mg | ORAL_TABLET | Freq: Every day | ORAL | Status: DC
  Administered 2020-10-29: 100 mg via ORAL
  Filled 2020-10-29: qty 4

## 2020-10-29 MED ORDER — BUPRENORPHINE HCL-NALOXONE HCL 8-2 MG SL SUBL
1.0000 | SUBLINGUAL_TABLET | Freq: Two times a day (BID) | SUBLINGUAL | Status: DC
  Administered 2020-10-29 – 2020-11-02 (×9): 1 via SUBLINGUAL
  Filled 2020-10-29 (×9): qty 1

## 2020-10-29 MED ORDER — ACETAMINOPHEN 325 MG PO TABS
650.0000 mg | ORAL_TABLET | Freq: Four times a day (QID) | ORAL | Status: DC | PRN
  Administered 2020-10-31 – 2020-11-02 (×4): 650 mg via ORAL
  Filled 2020-10-29 (×4): qty 2

## 2020-10-29 MED ORDER — ZOLPIDEM TARTRATE 5 MG PO TABS
10.0000 mg | ORAL_TABLET | Freq: Every day | ORAL | Status: DC
  Administered 2020-10-29 – 2020-11-01 (×4): 10 mg via ORAL
  Filled 2020-10-29 (×4): qty 2

## 2020-10-29 MED ORDER — PERFLUTREN LIPID MICROSPHERE
1.0000 mL | INTRAVENOUS | Status: AC | PRN
  Filled 2020-10-29: qty 10

## 2020-10-29 MED ORDER — SPIRONOLACTONE 25 MG PO TABS
50.0000 mg | ORAL_TABLET | Freq: Every day | ORAL | Status: DC
  Administered 2020-10-30 – 2020-11-02 (×4): 50 mg via ORAL
  Filled 2020-10-29 (×4): qty 2

## 2020-10-29 NOTE — Progress Notes (Signed)
Patient ID: Nezar Buckles, male   DOB: [birth year], 65 y.o.   MRN: [id number]     Advanced Heart Failure Rounding Note  PCP-Cardiologist: Godfrey Pick Tobb, DO   Subjective:    Patient reports good diuresis overnight but still in ER and I/Os not recorded.     Objective:   Weight Range: (!) 176.9 kg Body mass index is 40.78 kg/m.   Vital Signs:   Temp:  [99.7 F (37.6 C)] 99.7 F (37.6 C) (07/18 1233) Pulse Rate:  [53-134] 72 (07/19 0800) Resp:  [10-20] 12 (07/19 0800) BP: (98-139)/(64-86) 139/86 (07/19 0800) SpO2:  [91 %-99 %] 97 % (07/19 0800) Weight:  [176.9 kg] 176.9 kg (07/18 1331)    Weight change: Filed Weights   10/28/20 1331  Weight: (!) 176.9 kg    Intake/Output:   Intake/Output Summary (Last 24 hours) at 10/29/2020 0849 Last data filed at 10/28/2020 1729 Gross per 24 hour  Intake --  Output 450 ml  Net -450 ml      Physical Exam    General:  Well appearing. No resp difficulty HEENT: Normal Neck: Thick. JVP difficult but appears elevated. Carotids 2+ bilat; no bruits. No lymphadenopathy or thyromegaly appreciated. Cor: PMI nondisplaced. Regular rate & rhythm. No rubs, gallops or murmurs. Lungs: Clear Abdomen: Soft, nontender, nondistended. No hepatosplenomegaly. No bruits or masses. Good bowel sounds. Extremities: No cyanosis, clubbing, rash. 1+ ankle edema.  Neuro: Alert & orientedx3, cranial nerves grossly intact. moves all 4 extremities w/o difficulty. Affect anxious/depressed   Telemetry   NSR 60s (personally reviewed)  Labs    CBC Recent Labs    10/28/20 1338  WBC 7.4  NEUTROABS 5.8  HGB 11.1*  HCT 37.6*  MCV 80.0  PLT 937*   Basic Metabolic Panel Recent Labs    10/28/20 1338 10/29/20 0352  NA 137 137  K 3.8 3.9  CL 97* 98  CO2 31 29  GLUCOSE 130* 104*  BUN 10 10  CREATININE 0.92 0.90  CALCIUM 9.3 9.5   Liver Function Tests No results for input(s): AST, ALT, ALKPHOS, BILITOT, PROT, ALBUMIN in the last 72 hours. No results  for input(s): LIPASE, AMYLASE in the last 72 hours. Cardiac Enzymes No results for input(s): CKTOTAL, CKMB, CKMBINDEX, TROPONINI in the last 72 hours.  BNP: BNP (last 3 results) Recent Labs    10/12/20 0615 10/17/20 1050 10/28/20 1338  BNP 33.4 49.9 28.1    ProBNP (last 3 results) No results for input(s): PROBNP in the last 8760 hours.   D-Dimer No results for input(s): DDIMER in the last 72 hours. Hemoglobin A1C No results for input(s): HGBA1C in the last 72 hours. Fasting Lipid Panel No results for input(s): CHOL, HDL, LDLCALC, TRIG, CHOLHDL, LDLDIRECT in the last 72 hours. Thyroid Function Tests No results for input(s): TSH, T4TOTAL, T3FREE, THYROIDAB in the last 72 hours.  Invalid input(s): FREET3  Other results:   Imaging    X-ray chest PA and lateral  Result Date: 10/29/2020 CLINICAL DATA:  CHF. EXAM: CHEST - 2 VIEW COMPARISON:  10/28/2020.  09/25/2019.  CT 10/12/2019. FINDINGS: Prominent pericardial fat pads again noted. Stable cardiomegaly. No pulmonary venous congestion. Stable chronic interstitial changes. No focal infiltrate or edema. No pleural effusion or pneumothorax. Degenerative change thoracic spine. IMPRESSION: Stable cardiomegaly. Stable chronic interstitial changes. No focal infiltrate or edema. Electronically Signed   By: Marcello Moores  Register   On: 10/29/2020 05:55   DG Shoulder Right  Result Date: 10/28/2020 CLINICAL DATA:  Syncopal episode fall EXAM:  RIGHT SHOULDER - 2+ VIEW COMPARISON:  None. FINDINGS: Moderate AC joint degenerative change. No fracture or malalignment. Slightly high-riding appearance of the humeral head as may be seen with rotator cuff disease. IMPRESSION: No acute osseous abnormality. Electronically Signed   By: Donavan Foil M.D.   On: 10/28/2020 16:54   CT Head Wo Contrast  Result Date: 10/28/2020 CLINICAL DATA:  Provided history: Head trauma, coagulopathy. Level 2 fall on blood thinners. Neck trauma, dangerous injury mechanism.  EXAM: CT HEAD WITHOUT CONTRAST CT CERVICAL SPINE WITHOUT CONTRAST TECHNIQUE: Multidetector CT imaging of the head and cervical spine was performed following the standard protocol without intravenous contrast. Multiplanar CT image reconstructions of the cervical spine were also generated. COMPARISON:  CT angiogram head/neck 10/12/2020. FINDINGS: CT HEAD FINDINGS Brain: Mild generalized parenchymal atrophy. There is no acute intracranial hemorrhage. No demarcated cortical infarct. No extra-axial fluid collection. No evidence of an intracranial mass. No midline shift. Partially empty sella turcica. Vascular: No hyperdense vessel. Skull: Normal. Negative for fracture or focal lesion. Sinuses/Orbits: Visualized orbits show no acute finding. Trace bilateral ethmoid sinus mucosal thickening. Mild polypoid mucosal thickening within the inferior maxillary sinuses bilaterally. CT CERVICAL SPINE FINDINGS Alignment: Straightening of the expected cervical lordosis. No significant spondylolisthesis. Skull base and vertebrae: The basion-dental and atlanto-dental intervals are maintained.No evidence of acute fracture to the cervical spine. Soft tissues and spinal canal: No prevertebral fluid or swelling. No visible canal hematoma. Disc levels: Cervical spondylosis with multilevel disc space narrowing, disc bulges, posterior disc osteophytes, endplate spurring, uncovertebral hypertrophy and facet arthrosis. Disc space narrowing is greatest at C4-C5, C5-C6, C6-C7 and C7-T1 (moderate/severe). Multilevel spinal canal stenosis. Most notably, there is no least moderate spinal canal stenosis at C4-C5 and C5-C6 and severe spinal canal stenosis at C6-C7. Multilevel bony neural foraminal narrowing. Multilevel ventral osteophytes. Upper chest: No consolidation within the imaged lung apices. No visible pneumothorax. IMPRESSION: CT head: 1. No evidence of acute intracranial abnormality. 2. Mild generalized parenchymal atrophy. 3. Paranasal  sinus disease, as described. CT cervical spine: 1. No evidence of acute fracture to the cervical spine. 2. Straightening of the expected cervical lordosis. 3. Cervical spondylosis, as outlined. Notably, there is suspected at least moderate spinal canal stenosis at C4-C5 and C5-C6, and apparent severe spinal canal stenosis at C6-C7. Electronically Signed   By: Kellie Simmering DO   On: 10/28/2020 15:19   CT Cervical Spine Wo Contrast  Result Date: 10/28/2020 CLINICAL DATA:  Provided history: Head trauma, coagulopathy. Level 2 fall on blood thinners. Neck trauma, dangerous injury mechanism. EXAM: CT HEAD WITHOUT CONTRAST CT CERVICAL SPINE WITHOUT CONTRAST TECHNIQUE: Multidetector CT imaging of the head and cervical spine was performed following the standard protocol without intravenous contrast. Multiplanar CT image reconstructions of the cervical spine were also generated. COMPARISON:  CT angiogram head/neck 10/12/2020. FINDINGS: CT HEAD FINDINGS Brain: Mild generalized parenchymal atrophy. There is no acute intracranial hemorrhage. No demarcated cortical infarct. No extra-axial fluid collection. No evidence of an intracranial mass. No midline shift. Partially empty sella turcica. Vascular: No hyperdense vessel. Skull: Normal. Negative for fracture or focal lesion. Sinuses/Orbits: Visualized orbits show no acute finding. Trace bilateral ethmoid sinus mucosal thickening. Mild polypoid mucosal thickening within the inferior maxillary sinuses bilaterally. CT CERVICAL SPINE FINDINGS Alignment: Straightening of the expected cervical lordosis. No significant spondylolisthesis. Skull base and vertebrae: The basion-dental and atlanto-dental intervals are maintained.No evidence of acute fracture to the cervical spine. Soft tissues and spinal canal: No prevertebral fluid or swelling. No visible canal  hematoma. Disc levels: Cervical spondylosis with multilevel disc space narrowing, disc bulges, posterior disc osteophytes,  endplate spurring, uncovertebral hypertrophy and facet arthrosis. Disc space narrowing is greatest at C4-C5, C5-C6, C6-C7 and C7-T1 (moderate/severe). Multilevel spinal canal stenosis. Most notably, there is no least moderate spinal canal stenosis at C4-C5 and C5-C6 and severe spinal canal stenosis at C6-C7. Multilevel bony neural foraminal narrowing. Multilevel ventral osteophytes. Upper chest: No consolidation within the imaged lung apices. No visible pneumothorax. IMPRESSION: CT head: 1. No evidence of acute intracranial abnormality. 2. Mild generalized parenchymal atrophy. 3. Paranasal sinus disease, as described. CT cervical spine: 1. No evidence of acute fracture to the cervical spine. 2. Straightening of the expected cervical lordosis. 3. Cervical spondylosis, as outlined. Notably, there is suspected at least moderate spinal canal stenosis at C4-C5 and C5-C6, and apparent severe spinal canal stenosis at C6-C7. Electronically Signed   By: Kellie Simmering DO   On: 10/28/2020 15:19   DG Chest Port 1 View  Result Date: 10/28/2020 CLINICAL DATA:  Recent syncopal episode with chest pain, initial encounter EXAM: PORTABLE CHEST 1 VIEW COMPARISON:  10/12/2020 FINDINGS: Cardiac shadow is stable. Lungs are well aerated bilaterally. No focal infiltrate or effusion is seen. No acute bony abnormality is noted. IMPRESSION: No active disease. Electronically Signed   By: Inez Catalina M.D.   On: 10/28/2020 14:34   DG Foot Complete Right  Result Date: 10/28/2020 CLINICAL DATA:  Syncope, fall EXAM: RIGHT FOOT COMPLETE - 3+ VIEW COMPARISON:  None. FINDINGS: Bones appear osteopenic. Diffuse soft tissue swelling. Acute intra-articular fracture at the base of the first proximal phalanx. No subluxation IMPRESSION: Acute nondisplaced intra-articular fracture at the base of the first proximal phalanx Electronically Signed   By: Donavan Foil M.D.   On: 10/28/2020 16:56     Medications:     Scheduled Medications:  apixaban   5 mg Oral BID   buprenorphine-naloxone  1 tablet Sublingual BID   eplerenone  100 mg Oral Daily   furosemide  80 mg Intravenous BID   gabapentin  800 mg Oral TID   metolazone  2.5 mg Oral Daily   metoprolol succinate  25 mg Oral BID   pantoprazole  40 mg Oral BID   potassium chloride SA  80 mEq Oral BID   pravastatin  20 mg Oral Daily   senna  2 tablet Oral Daily   sertraline  25 mg Oral Daily   sodium chloride flush  3 mL Intravenous Q12H   tamsulosin  0.4 mg Oral Daily   zolpidem  5 mg Oral QHS    Infusions:   PRN Medications: acetaminophen, albuterol, ondansetron, polyethylene glycol, sodium chloride   Assessment/Plan   1. Acute on chronic diastolic CHF: Suspect prominent RV dysfunction.  Echo in 12/21 with EF 16-10%, grade 2 diastolic dysfunction, RV poorly visualized.  Multiple CHF admissions despite aggressive outpatient diuretic regimen and paramedicine.  He had IV Lasix in clinic twice in the last couple weeks.  Creatinine normal.  - Lasix 80 mg IV bid + metolazone 2.5 mg daily for now.   - Replace K aggressively. - He did not tolerate Jardiance due to GI upset. - Continue eplerenone 100 mg daily.  - RHC prior to discharge.  - He is failing outpatient management with multiple readmissions. He has refused Cardiomems in the past.  I strongly suggested Cardiomems placement to allow [place] to monitor his volume status more effectively.  He is scared of getting an implanted device, wants to talk  to someone who has a Cardiomems.  I will see if this can be facilitated.  2. H/o PE/DVT: Suspect due to sedentary lifestyle.  Occurred in 2/21.  V/Q scan in 6/21 did not show evidence for chronic PE.   - Continue apixaban. 3. OSA: Cannot tolerate CPAP. 4. Anxiety/depression: This is a major issue.   - Increase sertraline to 50 mg daily.  - Need to get him set up with an outpatient psychiatrist.  5. CAD: Cath 1/22 with no significant CAD. 6. Hypertension: Stable. - Continue Toprol XL 25  mg twice a day.   7. Atrial fibrillation: Paroxysmal. He is in NSR.  - Continue apixaban.  - Continue Toprol XL 8. Fall versus syncope: Unclear what happened just prior to admission.  It may have been a mechanical fall.  - Watch on telemetry.   Length of Stay: 1  Loralie Champagne, MD  10/29/2020, 8:49 AM  Advanced Heart Failure Team Pager [phone] (M-F; 7a - 5p)  Please contact [facility] for night-coverage after hours (5p -7a ) and weekends on [email or web address]

## 2020-10-29 NOTE — Progress Notes (Addendum)
This chaplain responded to spiritual care consult for prayer.  The chaplain agreed with the Pt. to return  after the Pt. bowel movement.  **1520 The chaplain offered a reflective listening presence as the Pt. talked about his faith, family, and the upcoming cardioMEM decision.  The chaplain understands the Pt is requesting time to talk to a person who is living with a cardioMEM as part of the Pt. decision making process. The chaplain hears the Pt. questioning how the procedure will improve his quality of life.    The Pt. indecisively responded to the chaplain's talking point of reconnecting with his son as a place of healing in several of the Pt. topics of discussion.  The Pt. accepted the chaplain's invitation for prayer and F/U spiritual care.

## 2020-10-29 NOTE — Progress Notes (Signed)
Provider notified of orthostatic BP results and dizzy when standing. HR 60s at rest. PO metoprolol held per Dr. Tonie Griffith.    Educated pt on fall risk precautions and use of bed alarm due to syncope causing admission and continued dizziness. Pt adamantly refusing bed alarm. RN emphasized purpose and importance of bed alarm but pt still refusing. RN instructed pt to call staff before getting out of bed or standing to use urinal and pt agrees he will do so.

## 2020-10-29 NOTE — Progress Notes (Signed)
PT Cancellation Note  Patient Details Name: Charles Gray MRN: [id number] DOB: October 06, 1955   Cancelled Treatment:    Reason Eval/Treat Not Completed: Medical issues which prohibited therapy.  Talked with nursing, and pt is walking to BR mult times a day.  Will assess his safety with gait as soon as WB orders for R foot address the foot fracture.   Ramond Dial 10/29/2020, 12:51 PM  Mee Hives, PT MS Acute Rehab Dept. Number: [place] [facility] [place]

## 2020-10-29 NOTE — Progress Notes (Signed)
*  PRELIMINARY RESULTS* Echocardiogram 2D Echocardiogram has been performed with [facility].  Luisa Hart RDCS 10/29/2020, 1:14 PM

## 2020-10-29 NOTE — Progress Notes (Signed)
OT Cancellation Note  Patient Details Name: Charles Gray MRN: [id number] DOB: Mar 23, 1956   Cancelled Treatment:    Reason Eval/Treat Not Completed: Other (comment) (Awaiting WB limit on R foot fracture) Joeseph Amor OTR/L  Acute Rehab Services  [phone] office number [phone] pager number  Joeseph Amor 10/29/2020, 11:41 AM

## 2020-10-29 NOTE — ED Notes (Signed)
Pt moved to hospital bed.

## 2020-10-29 NOTE — Progress Notes (Signed)
PROGRESS NOTE    Charles Gray  TGY:[id number] DOB: Apr 01, 1956 DOA: 10/28/2020 PCP: Enid Skeens., MD    Brief Narrative:  Mr. Charles Gray was admitted to the hospital with the working diagnosis of acute on chronic diastolic heart failure decompensation.   65 year old male past medical history for chronic diastolic heart failure, hypertension, paroxysmal atrial fibrillation and obesity class III who presented after a syncope episode.  He was recently diagnosed with CHF exacerbation and his torsemide was increased from 80 mg twice daily to 100 mg twice daily along with metolazone twice weekly.  Despite adjustments in his diuretic therapy he continued to gain weight and developed worsening lower extremity edema.  Feeling generalized weakness and lightheadedness, on the day of hospitalization while going to the bathroom he stood up and developed an orthostatic syncope.  On his initial physical examination blood pressure 111/71, heart rate 64, respiratory 14, oxygen saturation 95%, his lungs had no wheezing or rhonchi, heart S1-S2, present, rhythmic, abdomen soft, nontender, positive lower extremity edema.  Sodium 137, potassium 3.8, chloride 97, bicarb 31, glucose 130, BUN 10, creatinine 0.92, BNP 28 high-sensitivity troponin 6-6, white count 7.4, hemoglobin 10.1, hematocrit 37.6, platelets 146 SARS COVID-19 negative.  CT head and cervical spine negative for acute changes.  Chest radiograph with cardiomegaly, hilar vascular congestion, left peribronchial cuffing.    EKG 74 bpm, left axis deviation, left anterior fascicular block, sinus rhythm with PACs, no significant ST segment or T wave changes.  Assessment & Plan:   Principal Problem:   CHF (congestive heart failure) (HCC) Active Problems:   Pulmonary embolism (HCC)   OSA (obstructive sleep apnea)   AF (paroxysmal atrial fibrillation) ([place])   Essential hypertension   Morbid obesity with BMI of 40.0-44.9, adult (HCC)    Syncope   Acute on chronic diastolic heart failure decompensation.  Documented urine output 1,200 ml  Continue to have edema at the lower extremities. Systolic blood pressure this pm 111 mmHg.   Plan to continue aggressive diuresis with furosemide 80 mg Iv q12 hrs, along with metolazone 2,5 mg daily and spironolactone 50 mg daily.  Follow up with echocardiogram.   2. HTN. dyslipidemia Continue close blood pressure monitoring.  Aggressive diuresis and B blockade.   Continue with statin therapy.   3. Paroxysmal atrial fibrillation. Continue rate control with metoprolol XL 25 mg po bid.  Anticoagulation with apixaban.   4. Chronic pain syndrome. Continue pain control with soboxone.   5. Acute right foot non displaced intra-articular fracture at the base of the first proximal phalanx. Continue pain control for now.   6. Obesity class 3/ depression. Calculated BMI is 40,7.  Continue with sertraline   7. Hypokalemia. Continue K correction with Kcl, target K of 4 and Mg of 2.   Patient continue to be at high risk for worsening heart failure   Status is: Inpatient  Remains inpatient appropriate because:Inpatient level of care appropriate due to severity of illness  Dispo: The patient is from: Home              Anticipated d/c is to: Home              Patient currently is not medically stable to d/c.   Difficult to place patient No   DVT prophylaxis: Apixaban   Code Status:    full  Family Communication:  No family at the bedside        Consultants:  Cardiology    Subjective: Patient continue to feel fatigued  and tired, positive lower extremity edema, no nausea or vomiting, dyspnea has improved, but not yet back to baseline.   Objective: Vitals:   10/29/20 0800 10/29/20 0909 10/29/20 1010 10/29/20 1144  BP: 139/86 113/68 134/82 130/86  Pulse: 72 (!) 59 73 70  Resp: 12 12 20 20   Temp:   98 F (36.7 C) 98.1 F (36.7 C)  TempSrc:   Oral Oral  SpO2: 97% 97% 97%  99%  Weight:      Height:        Intake/Output Summary (Last 24 hours) at 10/29/2020 1317 Last data filed at 10/29/2020 1247 Gross per 24 hour  Intake 837 ml  Output 1650 ml  Net -813 ml   Filed Weights   10/28/20 1331  Weight: (!) 176.9 kg    Examination:   General: Not in pain or dyspnea, deconditioned  Neurology: Awake and alert, non focal  E ENT: no pallor, no icterus, oral mucosa moist Cardiovascular: No JVD. S1-S2 present, rhythmic, no gallops, rubs, or murmurs. ++ bilateral pitting lower extremity edema. Pulmonary: positive breath sounds bilaterally, no wheezing, rhonchi positive scattered rales bilaterally. Gastrointestinal. Abdomen soft and non tender Skin. No rashes Musculoskeletal: no joint deformities     Data Reviewed: I have personally reviewed following labs and imaging studies  CBC: Recent Labs  Lab 10/28/20 1338  WBC 7.4  NEUTROABS 5.8  HGB 11.1*  HCT 37.6*  MCV 80.0  PLT 505*   Basic Metabolic Panel: Recent Labs  Lab 10/28/20 1338 10/29/20 0352  NA 137 137  K 3.8 3.9  CL 97* 98  CO2 31 29  GLUCOSE 130* 104*  BUN 10 10  CREATININE 0.92 0.90  CALCIUM 9.3 9.5  MG  --  2.4  PHOS  --  3.8   GFR: Estimated Creatinine Clearance: 153.8 mL/min (by C-G formula based on SCr of 0.9 mg/dL). Liver Function Tests: No results for input(s): AST, ALT, ALKPHOS, BILITOT, PROT, ALBUMIN in the last 168 hours. No results for input(s): LIPASE, AMYLASE in the last 168 hours. No results for input(s): AMMONIA in the last 168 hours. Coagulation Profile: No results for input(s): INR, PROTIME in the last 168 hours. Cardiac Enzymes: No results for input(s): CKTOTAL, CKMB, CKMBINDEX, TROPONINI in the last 168 hours. BNP (last 3 results) No results for input(s): PROBNP in the last 8760 hours. HbA1C: No results for input(s): HGBA1C in the last 72 hours. CBG: Recent Labs  Lab 10/29/20 0507  GLUCAP 163*   Lipid Profile: No results for input(s): CHOL, HDL,  LDLCALC, TRIG, CHOLHDL, LDLDIRECT in the last 72 hours. Thyroid Function Tests: No results for input(s): TSH, T4TOTAL, FREET4, T3FREE, THYROIDAB in the last 72 hours. Anemia Panel: No results for input(s): VITAMINB12, FOLATE, FERRITIN, TIBC, IRON, RETICCTPCT in the last 72 hours.    Radiology Studies: I have reviewed all of the imaging during this hospital visit personally     Scheduled Meds:  apixaban  5 mg Oral BID   buprenorphine-naloxone  1 tablet Sublingual BID   furosemide  80 mg Intravenous BID   gabapentin  800 mg Oral TID   metolazone  2.5 mg Oral Daily   metoprolol succinate  25 mg Oral BID   pantoprazole  40 mg Oral BID   potassium chloride SA  80 mEq Oral BID   pravastatin  20 mg Oral Daily   senna  2 tablet Oral Daily   sertraline  25 mg Oral Daily   sodium chloride flush  3 mL Intravenous  Q12H   spironolactone  50 mg Oral Daily   tamsulosin  0.4 mg Oral Daily   zolpidem  5 mg Oral QHS   Continuous Infusions:   LOS: 1 day        Eular Panek Gerome Apley, MD

## 2020-10-29 NOTE — Progress Notes (Signed)
PT Cancellation Note  Patient Details Name: Charles Gray MRN: [id number] DOB: [birth year]   Cancelled Treatment:    Reason Eval/Treat Not Completed: Medical issues which prohibited therapy.  Awaiting WB limit on R foot fracture.  Follow up as time and pt allow.   Ramond Dial 10/29/2020, 10:13 AM  Mee Hives, PT MS Acute Rehab Dept. Number: [place] and [place]

## 2020-10-30 ENCOUNTER — Encounter (HOSPITAL_COMMUNITY): Payer: Self-pay | Admitting: Internal Medicine

## 2020-10-30 DIAGNOSIS — I10 Essential (primary) hypertension: Secondary | ICD-10-CM | POA: Diagnosis not present

## 2020-10-30 DIAGNOSIS — I5031 Acute diastolic (congestive) heart failure: Secondary | ICD-10-CM | POA: Diagnosis not present

## 2020-10-30 DIAGNOSIS — I480 Paroxysmal atrial fibrillation: Secondary | ICD-10-CM | POA: Diagnosis not present

## 2020-10-30 DIAGNOSIS — G4733 Obstructive sleep apnea (adult) (pediatric): Secondary | ICD-10-CM | POA: Diagnosis not present

## 2020-10-30 DIAGNOSIS — I5033 Acute on chronic diastolic (congestive) heart failure: Secondary | ICD-10-CM | POA: Diagnosis not present

## 2020-10-30 LAB — BASIC METABOLIC PANEL
Anion gap: 9 (ref 5–15)
BUN: 12 mg/dL (ref 8–23)
CO2: 32 mmol/L (ref 22–32)
Calcium: 9.3 mg/dL (ref 8.9–10.3)
Chloride: 94 mmol/L — ABNORMAL LOW (ref 98–111)
Creatinine, Ser: 0.96 mg/dL (ref 0.61–1.24)
GFR, Estimated: >60 mL/min (ref >60)
Glucose, Bld: 115 mg/dL — ABNORMAL HIGH (ref 70–99)
Potassium: 3.8 mmol/L (ref 3.5–5.1)
Sodium: 135 mmol/L (ref 135–145)

## 2020-10-30 LAB — GLUCOSE, CAPILLARY: Glucose-Capillary: 130 mg/dL — ABNORMAL HIGH (ref 70–99)

## 2020-10-30 MED ORDER — POLYETHYLENE GLYCOL 3350 17 G PO PACK
17.0000 g | PACK | Freq: Every day | ORAL | Status: DC
  Administered 2020-10-31 – 2020-11-01 (×2): 17 g via ORAL
  Filled 2020-10-30 (×2): qty 1

## 2020-10-30 MED ORDER — METOLAZONE 5 MG PO TABS
5.0000 mg | ORAL_TABLET | Freq: Every day | ORAL | Status: DC
  Administered 2020-10-30 – 2020-11-01 (×3): 5 mg via ORAL
  Filled 2020-10-30 (×3): qty 1

## 2020-10-30 NOTE — Progress Notes (Signed)
Patient ID: Charles Gray, male   DOB: 08/07/1955, 65 y.o.   MRN: [id number]     Advanced Heart Failure Rounding Note  PCP-Cardiologist: Kardie Tobb, DO   Subjective:    I/Os negative, weight trending down.  Feels "sick to my stomach."  Breathing is improving.   Echo: EF 60-65%, mild LVH, normal RV, unable to estimate PA systolic pressure, IVC not visualized.   Objective:   Weight Range: (!) 169.1 kg Body mass index is 38.99 kg/m.   Vital Signs:   Temp:  [97.6 F (36.4 C)-98.1 F (36.7 C)] 97.9 F (36.6 C) (07/20 0415) Pulse Rate:  [59-73] 67 (07/20 0415) Resp:  [12-21] 19 (07/20 0415) BP: (111-134)/(54-86) 116/68 (07/20 0415) SpO2:  [96 %-99 %] 99 % (07/20 0600) Weight:  [169.1 kg] 169.1 kg (07/20 0035) Last BM Date: 10/29/20  Weight change: Filed Weights   10/28/20 1331 10/30/20 0035  Weight: (!) 176.9 kg (!) 169.1 kg    Intake/Output:   Intake/Output Summary (Last 24 hours) at 10/30/2020 0832 Last data filed at 10/29/2020 2243 Gross per 24 hour  Intake 1557 ml  Output 2825 ml  Net -1268 ml      Physical Exam    General: NAD, obese.  Neck: JVP difficult but appears elevated, no thyromegaly or thyroid nodule.  Lungs: Clear to auscultation bilaterally with normal respiratory effort. CV: Nondisplaced PMI.  Heart regular S1/S2, no S3/S4, no murmur.  1+ ankle edema.  Abdomen: Soft, nontender, no hepatosplenomegaly, no distention.  Skin: Intact without lesions or rashes.  Neurologic: Alert and oriented x 3.  Psych: Normal affect. Extremities: No clubbing or cyanosis.  HEENT: Normal.    Telemetry   NSR 60s (personally reviewed)  Labs    CBC Recent Labs    10/28/20 1338  WBC 7.4  NEUTROABS 5.8  HGB 11.1*  HCT 37.6*  MCV 80.0  PLT 734*   Basic Metabolic Panel Recent Labs    10/29/20 0352 10/30/20 0246  NA 137 135  K 3.9 3.8  CL 98 94*  CO2 29 32  GLUCOSE 104* 115*  BUN 10 12  CREATININE 0.90 0.96  CALCIUM 9.5 9.3  MG 2.4  --   PHOS  3.8  --    Liver Function Tests No results for input(s): AST, ALT, ALKPHOS, BILITOT, PROT, ALBUMIN in the last 72 hours. No results for input(s): LIPASE, AMYLASE in the last 72 hours. Cardiac Enzymes No results for input(s): CKTOTAL, CKMB, CKMBINDEX, TROPONINI in the last 72 hours.  BNP: BNP (last 3 results) Recent Labs    10/12/20 0615 10/17/20 1050 10/28/20 1338  BNP 33.4 49.9 28.1    ProBNP (last 3 results) No results for input(s): PROBNP in the last 8760 hours.   D-Dimer No results for input(s): DDIMER in the last 72 hours. Hemoglobin A1C No results for input(s): HGBA1C in the last 72 hours. Fasting Lipid Panel No results for input(s): CHOL, HDL, LDLCALC, TRIG, CHOLHDL, LDLDIRECT in the last 72 hours. Thyroid Function Tests No results for input(s): TSH, T4TOTAL, T3FREE, THYROIDAB in the last 72 hours.  Invalid input(s): FREET3  Other results:   Imaging    ECHOCARDIOGRAM COMPLETE  Result Date: 10/29/2020    ECHOCARDIOGRAM REPORT   Patient Name:   Charles Gray Date of Exam: 10/29/2020 Medical Rec #:  [id number]       Height:       82.0 in Accession #:    [id number]      Weight:  390.0 lb Date of Birth:  [birth year]       BSA:          3.110 m Patient Age:    36 years        BP:           130/86 mmHg Patient Gender: M               HR:           65 bpm. Exam Location:  Inpatient Procedure: 2D Echo, Cardiac Doppler, Color Doppler and Intracardiac            Opacification Agent Indications:    CHF  History:        Patient has prior history of Echocardiogram examinations, most                 recent 03/25/2020. CHF, Arrythmias:Atrial Fibrillation,                 Signs/Symptoms:Dyspnea and Chest Pain; Risk Factors:Hypertension                 and Dyslipidemia.  Sonographer:    Luisa Hart RDCS Referring Phys: [id number] Lequita Halt  Sonographer Comments: Patient is morbidly obese and Technically difficult study due to poor echo windows. Image acquisition challenging due to  patient body habitus. 05/10/20 cath 08/09/19 cath IMPRESSIONS  1. Left ventricular ejection fraction, by estimation, is 60 to 65%. The left ventricle has normal function. The left ventricle has no regional wall motion abnormalities. There is mild concentric left ventricular hypertrophy. Left ventricular diastolic parameters were normal.  2. Right ventricular systolic function is normal. The right ventricular size is normal. Tricuspid regurgitation signal is inadequate for assessing PA pressure.  3. Left atrial size was mildly dilated.  4. The mitral valve is normal in structure. No evidence of mitral valve regurgitation. No evidence of mitral stenosis.  5. The aortic valve is tricuspid. Aortic valve regurgitation is not visualized. No aortic stenosis is present.  6. There is borderline dilatation of the aortic root, measuring 38 mm. Comparison(s): No significant change from prior study. Prior images reviewed side by side. FINDINGS  Left Ventricle: Left ventricular ejection fraction, by estimation, is 60 to 65%. The left ventricle has normal function. The left ventricle has no regional wall motion abnormalities. The left ventricular internal cavity size was normal in size. There is  mild concentric left ventricular hypertrophy. Left ventricular diastolic parameters were normal. Normal left ventricular filling pressure. Right Ventricle: The right ventricular size is normal. No increase in right ventricular wall thickness. Right ventricular systolic function is normal. Tricuspid regurgitation signal is inadequate for assessing PA pressure. Left Atrium: Left atrial size was mildly dilated. Right Atrium: Right atrial size was normal in size. Pericardium: There is no evidence of pericardial effusion. Mitral Valve: The mitral valve is normal in structure. No evidence of mitral valve regurgitation. No evidence of mitral valve stenosis. Tricuspid Valve: The tricuspid valve is normal in structure. Tricuspid valve regurgitation  is not demonstrated. No evidence of tricuspid stenosis. Aortic Valve: The aortic valve is tricuspid. Aortic valve regurgitation is not visualized. No aortic stenosis is present. Aortic valve mean gradient measures 4.0 mmHg. Aortic valve peak gradient measures 7.1 mmHg. Aortic valve area, by VTI measures 2.92 cm. Pulmonic Valve: The pulmonic valve was not well visualized. Pulmonic valve regurgitation is not visualized. No evidence of pulmonic stenosis. Aorta: The aortic root is normal in size and structure. There is borderline dilatation of  the aortic root, measuring 38 mm. Venous: The inferior vena cava was not well visualized. IAS/Shunts: No atrial level shunt detected by color flow Doppler.  LEFT VENTRICLE PLAX 2D LVIDd:         5.67 cm  Diastology LVIDs:         3.65 cm  LV e' medial:    10.10 cm/s LV PW:         1.20 cm  LV E/e' medial:  7.7 LV IVS:        1.20 cm  LV e' lateral:   11.70 cm/s LVOT diam:     2.40 cm  LV E/e' lateral: 6.6 LV SV:         83 LV SV Index:   27 LVOT Area:     4.52 cm  LEFT ATRIUM           Index       RIGHT ATRIUM           Index LA diam:      4.00 cm 1.29 cm/m  RA Area:     16.90 cm LA Vol (A4C): 44.4 ml 14.28 ml/m RA Volume:   44.30 ml  14.25 ml/m  AORTIC VALVE                   PULMONIC VALVE AV Area (Vmax):    2.71 cm    PV Vmax:       0.86 m/s AV Area (Vmean):   2.82 cm    PV Vmean:      63.500 cm/s AV Area (VTI):     2.92 cm    PV VTI:        0.208 m AV Vmax:           133.00 cm/s PV Peak grad:  3.0 mmHg AV Vmean:          94.000 cm/s PV Mean grad:  2.0 mmHg AV VTI:            0.284 m AV Peak Grad:      7.1 mmHg AV Mean Grad:      4.0 mmHg LVOT Vmax:         79.80 cm/s LVOT Vmean:        58.600 cm/s LVOT VTI:          0.183 m LVOT/AV VTI ratio: 0.64  AORTA Ao Root diam: 3.80 cm Ao Asc diam:  3.50 cm MITRAL VALVE MV Area (PHT): 3.48 cm    SHUNTS MV Decel Time: 218 msec    Systemic VTI:  0.18 m MV E velocity: 77.60 cm/s  Systemic Diam: 2.40 cm MV A velocity: 82.50 cm/s  MV E/A ratio:  0.94 Mihai Croitoru MD Electronically signed by Sanda Klein MD Signature Date/Time: 10/29/2020/4:21:21 PM    Final      Medications:     Scheduled Medications:  apixaban  5 mg Oral BID   buprenorphine-naloxone  1 tablet Sublingual BID   furosemide  80 mg Intravenous BID   gabapentin  800 mg Oral TID   metolazone  5 mg Oral Daily   metoprolol succinate  25 mg Oral BID   pantoprazole  40 mg Oral BID   potassium chloride SA  80 mEq Oral BID   pravastatin  20 mg Oral Daily   senna  2 tablet Oral Daily   sertraline  25 mg Oral Daily   sodium chloride flush  3 mL Intravenous Q12H   spironolactone  50 mg  Oral Daily   tamsulosin  0.4 mg Oral Daily   zolpidem  10 mg Oral QHS    Infusions:   PRN Medications: acetaminophen, albuterol, ondansetron, polyethylene glycol, sodium chloride   Assessment/Plan   1. Acute on chronic diastolic CHF: Suspect prominent RV dysfunction.  Echo this admission with EF 60-65%, mild LVH, normal RV, unable to estimate PA systolic pressure, IVC not visualized.  Multiple CHF admissions despite aggressive outpatient diuretic regimen and paramedicine.  He had IV Lasix in clinic twice in the last couple weeks.  Renal function stable.  He is diuresing now but still symptomatic.  Exam difficult for volume but probably volume overloaded.  - Lasix 80 mg IV bid + metolazone 5 mg today.   - Replace K aggressively. - He did not tolerate Jardiance due to GI upset. - Continue spironolactone 50 mg daily.  - RHC prior to discharge.  - He is failing outpatient management with multiple readmissions. He has refused Cardiomems in the past.  I strongly suggested Cardiomems placement to allow [place] to monitor his volume status more effectively.  He is scared of getting an implanted device, wants to talk to someone who has a Cardiomems.  I will see if this can be facilitated.  2. H/o PE/DVT: Suspect due to sedentary lifestyle.  Occurred in 2/21.  V/Q scan in 6/21 did  not show evidence for chronic PE.   - Continue apixaban. 3. OSA: Cannot tolerate CPAP. 4. Anxiety/depression: This is a major issue.   - Increase sertraline to 50 mg daily.  - Need to get him set up with an outpatient psychiatrist.  5. CAD: Cath 1/22 with no significant CAD. 6. Hypertension: Stable. - Continue Toprol XL 25 mg twice a day.   7. Atrial fibrillation: Paroxysmal. He is in NSR.  - Continue apixaban.  - Continue Toprol XL 8. Fall versus syncope: Unclear what happened just prior to admission.  It may have been a mechanical fall.  - Watch on telemetry, no events so far.   Length of Stay: 2  Loralie Champagne, MD  10/30/2020, 8:32 AM  Advanced Heart Failure Team Pager [phone] (M-F; 7a - 5p)  Please contact [facility] for night-coverage after hours (5p -7a ) and weekends on [email or web address]

## 2020-10-30 NOTE — Plan of Care (Signed)

## 2020-10-30 NOTE — Progress Notes (Signed)
Patient c/o L arm pain like cramp that last for few minutes per patient.MD notified, no new order given. Will continue to monitor

## 2020-10-30 NOTE — Evaluation (Signed)
Physical Therapy Evaluation Patient Details Name: Charles Gray MRN: [id number] DOB: February 10, 1956 Today's Date: 10/30/2020   History of Present Illness  Kip Vasques is a 65 y.o. male with medical history significant of chronic diastolic CHF, chronic right-sided CHF, HTN, PAF on Eliquis, morbid obesity, chronic ambulation dysfunction, presented with syncope and fall.  Found to have acute nondisplaced intra-articular fracture at the base of the first  proximal phalanx, buddy taped.   Clinical Impression  Patient received sitting up on side of bed. He requires supervision for sit to stand and ambulation of 50 feet with RW. Patient is pain limited with mobility.  Reports R shoulder and knee pain and Left foot pain. He will continue to benefit from skilled PT while here to improve mobility and independence.     Follow Up Recommendations No PT follow up    Equipment Recommendations  None recommended by PT    Recommendations for Other Services       Precautions / Restrictions Precautions Precautions: Fall Restrictions Weight Bearing Restrictions: No Other Position/Activity Restrictions: per MD, patient can be WBAT on L LE      Mobility  Bed Mobility               General bed mobility comments: patient received sitting up on the side of bed.    Transfers Overall transfer level: Needs assistance Equipment used: Rolling walker (2 wheeled) Transfers: Sit to/from Stand Sit to Stand: Supervision            Ambulation/Gait Ambulation/Gait assistance: Supervision Gait Distance (Feet): 50 Feet Assistive device: Rolling walker (2 wheeled) Gait Pattern/deviations: Step-to pattern;Decreased weight shift to left;Decreased step length - right;Trunk flexed Gait velocity: decr   General Gait Details: patient with antalgic gait pattern, pain in right knee, right shoudler and left foot with ambulation  Stairs            Wheelchair Mobility    Modified Rankin (Stroke  Patients Only)       Balance Overall balance assessment: Needs assistance Sitting-balance support: Feet supported Sitting balance-Leahy Scale: Normal     Standing balance support: Bilateral upper extremity supported;During functional activity Standing balance-Leahy Scale: Fair Standing balance comment: reliant on support                             Pertinent Vitals/Pain Pain Assessment: Faces Faces Pain Scale: Hurts little more Pain Location: L foot, R knee, R shoulder Pain Descriptors / Indicators: Aching;Discomfort;Sore Pain Intervention(s): Monitored during session;Repositioned    Home Living Family/patient expects to be discharged to:: Private residence Living Arrangements: Alone Available Help at Discharge: Personal care attendant Type of Home: Apartment Home Access: Stairs to enter Entrance Stairs-Rails: Right;Left;Can reach both Entrance Stairs-Number of Steps: 14 Home Layout: One level Home Equipment: Tub bench;Walker - 2 wheels;Hand held shower head;Hospital bed      Prior Function Level of Independence: Needs assistance   Gait / Transfers Assistance Needed: using walker for gait, sleeps in recliner  ADL's / Homemaking Assistance Needed: aide assists with getting in/out of tub/bathing and dressing assist 4x/wk, assist for homemaking, groceries delivered  Comments: Has HH aide 1.5 hrs 4 days/week     Hand Dominance   Dominant Hand: Right    Extremity/Trunk Assessment   Upper Extremity Assessment Upper Extremity Assessment: RUE deficits/detail RUE Sensation: WNL RUE Coordination: decreased gross motor    Lower Extremity Assessment Lower Extremity Assessment: RLE deficits/detail RLE: Unable to fully assess due  to pain    Cervical / Trunk Assessment Cervical / Trunk Assessment: Normal  Communication   Communication: No difficulties  Cognition Arousal/Alertness: Awake/alert Behavior During Therapy: WFL for tasks  assessed/performed Overall Cognitive Status: Within Functional Limits for tasks assessed                                        General Comments      Exercises     Assessment/Plan    PT Assessment Patient needs continued PT services  PT Problem List Decreased mobility;Decreased activity tolerance;Pain       PT Treatment Interventions Gait training;Therapeutic exercise;Stair training;Functional mobility training;Therapeutic activities;Patient/family education    PT Goals (Current goals can be found in the Care Plan section)  Acute Rehab PT Goals Patient Stated Goal: decrease pain, go home PT Goal Formulation: With patient Time For Goal Achievement: 11/06/20 Potential to Achieve Goals: Good    Frequency     Barriers to discharge Inaccessible home environment      Co-evaluation               AM-PAC PT "6 Clicks" Mobility  Outcome Measure Help needed turning from your back to your side while in a flat bed without using bedrails?: A Little Help needed moving from lying on your back to sitting on the side of a flat bed without using bedrails?: A Little Help needed moving to and from a bed to a chair (including a wheelchair)?: A Little Help needed standing up from a chair using your arms (e.g., wheelchair or bedside chair)?: A Little Help needed to walk in hospital room?: A Lot Help needed climbing 3-5 steps with a railing? : A Lot 6 Click Score: 16    End of Session Equipment Utilized During Treatment: Gait belt Activity Tolerance: Patient limited by pain Patient left: in bed;with call bell/phone within reach Nurse Communication: Mobility status PT Visit Diagnosis: Other abnormalities of gait and mobility (R26.89);Difficulty in walking, not elsewhere classified (R26.2);Pain;History of falling (Z91.81) Pain - Right/Left:  (B) Pain - part of body: Ankle and joints of foot;Knee;Shoulder    Time: 9562-1308 PT Time Calculation (min) (ACUTE ONLY): 18  min   Charges:   PT Evaluation $PT Eval Moderate Complexity: 1 Mod PT Treatments $Gait Training: 8-22 mins        Latrisha Coiro, PT, GCS 10/30/20,3:01 PM

## 2020-10-30 NOTE — Progress Notes (Signed)
PROGRESS NOTE    Charles Gray  DTO:[id number] DOB: [birth year] DOA: 10/28/2020 PCP: Enid Skeens., MD    Brief Narrative:  65 year old male past medical history for chronic diastolic heart failure, hypertension, paroxysmal atrial fibrillation and obesity class III who presented after a syncope episode. He was recently diagnosed with CHF exacerbation and his torsemide was increased from 80 mg twice daily to 100 mg twice daily along with metolazone twice weekly.  Despite adjustments in his diuretic therapy he continued to gain weight and developed worsening lower extremity edema.  Feeling generalized weakness and lightheadedness, on the day of hospitalization while going to the bathroom he stood up and developed an orthostatic syncope. CT head and cervical spine negative for acute changes. Chest radiograph with cardiomegaly, hilar vascular congestion, left peribronchial cuffing.  Patient admitted for further management.  Assessment & Plan:   Principal Problem:   CHF (congestive heart failure) (HCC) Active Problems:   Pulmonary embolism (HCC)   OSA (obstructive sleep apnea)   AF (paroxysmal atrial fibrillation) ([place])   Essential hypertension   Morbid obesity with BMI of 40.0-44.9, adult (HCC)   Syncope  Acute on chronic diastolic heart failure decompensation Multiple CHF admissions despite aggressive outpatient diuretic regimen Echo with EF of 60 to 65%, IVC not visualized LHC done 1/22 with no significant CAD Cardiology/HF on board, plan for possible RHC prior to discharge, and Cardiomems placement (patient currently refusing) Continue aggressive diuresis with furosemide 80 mg Iv q12 hrs, along with metolazone Continue spironolactone 50 mg daily Strict I's and O's, daily weights  Hx of PE/DVT VQ scan in 6/21 did not show any evidence of chronic PE Continue apixaban  HTN  Continue Toprol  Paroxysmal atrial fibrillation   Continue rate control with metoprolol XL 25 mg po bid.   Anticoagulation with apixaban  Chronic pain syndrome Continue pain control with soboxone.   Acute right foot non displaced intra-articular fracture at the base of the first proximal phalanx. Continue pain control for now PT/OT Fall precautions  OSA Unable to tolerate CPAP  Obesity Lifestyle modification advised  Anxiety/depression Continue home regimen     Status is: Inpatient  Remains inpatient appropriate because:Inpatient level of care appropriate due to severity of illness  Dispo: The patient is from: Home              Anticipated d/c is to: Home              Patient currently is not medically stable to d/c.   Difficult to place patient No   DVT prophylaxis: Apixaban   Code Status:    full  Family Communication:  No family at the bedside        Consultants:  Cardiology    Subjective: Patient continues to report generalized weakness, reports constipation, some nausea but denies any vomiting or abdominal pain, denies any chest pain or worsening shortness of breath.  Denies any fever/chills.   Objective: Vitals:   10/30/20 0415 10/30/20 0600 10/30/20 0833 10/30/20 1056  BP: 116/68  127/73 127/81  Pulse: 67  68 68  Resp: 19  16 16   Temp: 97.9 F (36.6 C)  98.2 F (36.8 C) 98.5 F (36.9 C)  TempSrc: Oral  Oral Oral  SpO2:  99% 99% 98%  Weight:      Height:        Intake/Output Summary (Last 24 hours) at 10/30/2020 1644 Last data filed at 10/30/2020 1333 Gross per 24 hour  Intake 1080 ml  Output 2795  ml  Net -1715 ml   Filed Weights   10/28/20 1331 10/30/20 0035  Weight: (!) 176.9 kg (!) 169.1 kg    Examination:  General: NAD, deconditioned Cardiovascular: S1, S2 present Respiratory: Bilateral rales noted Abdomen: Soft, nontender, nondistended, bowel sounds present Musculoskeletal: 2+ bilateral pedal edema noted Skin: Normal Psychiatry: Normal mood       Data Reviewed: I have personally reviewed following labs and imaging  studies  CBC: Recent Labs  Lab 10/28/20 1338  WBC 7.4  NEUTROABS 5.8  HGB 11.1*  HCT 37.6*  MCV 80.0  PLT 818*   Basic Metabolic Panel: Recent Labs  Lab 10/28/20 1338 10/29/20 0352 10/30/20 0246  NA 137 137 135  K 3.8 3.9 3.8  CL 97* 98 94*  CO2 31 29 32  GLUCOSE 130* 104* 115*  BUN 10 10 12   CREATININE 0.92 0.90 0.96  CALCIUM 9.3 9.5 9.3  MG  --  2.4  --   PHOS  --  3.8  --    GFR: Estimated Creatinine Clearance: 140.7 mL/min (by C-G formula based on SCr of 0.96 mg/dL). Liver Function Tests: No results for input(s): AST, ALT, ALKPHOS, BILITOT, PROT, ALBUMIN in the last 168 hours. No results for input(s): LIPASE, AMYLASE in the last 168 hours. No results for input(s): AMMONIA in the last 168 hours. Coagulation Profile: No results for input(s): INR, PROTIME in the last 168 hours. Cardiac Enzymes: No results for input(s): CKTOTAL, CKMB, CKMBINDEX, TROPONINI in the last 168 hours. BNP (last 3 results) No results for input(s): PROBNP in the last 8760 hours. HbA1C: No results for input(s): HGBA1C in the last 72 hours. CBG: Recent Labs  Lab 10/29/20 0507 10/30/20 0604  GLUCAP 163* 130*   Lipid Profile: No results for input(s): CHOL, HDL, LDLCALC, TRIG, CHOLHDL, LDLDIRECT in the last 72 hours. Thyroid Function Tests: No results for input(s): TSH, T4TOTAL, FREET4, T3FREE, THYROIDAB in the last 72 hours. Anemia Panel: No results for input(s): VITAMINB12, FOLATE, FERRITIN, TIBC, IRON, RETICCTPCT in the last 72 hours.    Radiology Studies: I have reviewed all of the imaging during this hospital visit personally     Scheduled Meds:  apixaban  5 mg Oral BID   buprenorphine-naloxone  1 tablet Sublingual BID   furosemide  80 mg Intravenous BID   gabapentin  800 mg Oral TID   metolazone  5 mg Oral Daily   metoprolol succinate  25 mg Oral BID   pantoprazole  40 mg Oral BID   potassium chloride SA  80 mEq Oral BID   pravastatin  20 mg Oral Daily   senna  2  tablet Oral Daily   sertraline  25 mg Oral Daily   sodium chloride flush  3 mL Intravenous Q12H   spironolactone  50 mg Oral Daily   tamsulosin  0.4 mg Oral Daily   zolpidem  10 mg Oral QHS   Continuous Infusions:   LOS: 2 days        Alma Friendly, MD

## 2020-10-30 NOTE — Discharge Instructions (Signed)

## 2020-10-30 NOTE — Progress Notes (Signed)
OT Cancellation Note  Patient Details Name: Charles Gray MRN: [id number] DOB: Aug 25, 1955   Cancelled Treatment:    Reason Eval/Treat Not Completed: Other (comment) (following pt and waiting for for WB status of RLE)  Joeseph Amor 10/30/2020, 8:49 AM

## 2020-10-30 NOTE — TOC CAGE-AID Note (Signed)
Transition of Care [facility]) - CAGE-AID Screening   Patient Details  Name: Charles Gray MRN: [id number] Date of Birth: August 11, 1955  Transition of Care [facility]) CM/SW Contact:    Charles Gray, Charles Gray Phone Number: 10/30/2020, 12:10 PM   Clinical Narrative: Pt participated in [place].  Pt stated he does not use substance or ETOH. Pt was offered resources.  Pt stated they did not feel that they were in need of resources at this time.   Charles Gray, MSW, LCSW-A Pronouns:  She/Her/Hers [facility] Health[facility]itions of Care Clinical Social Worker Direct Number:  [phone] [email or web address]   CAGE-AID Screening:    Have You Ever Felt You Ought to Cut Down on Your Drinking or Drug Use?: No Have People Annoyed You By [employer] Your Drinking Or Drug Use?: No Have You Felt Bad Or Guilty About Your Drinking Or Drug Use?: No Have You Ever Had a Drink or Used Drugs First Thing In The Morning to Steady Your Nerves or to Get Rid of a Hangover?: No CAGE-AID Score: 0  Substance Abuse Education Offered: Yes

## 2020-10-31 ENCOUNTER — Telehealth (HOSPITAL_COMMUNITY): Payer: Self-pay | Admitting: Cardiology

## 2020-10-31 DIAGNOSIS — I480 Paroxysmal atrial fibrillation: Secondary | ICD-10-CM | POA: Diagnosis not present

## 2020-10-31 DIAGNOSIS — G4733 Obstructive sleep apnea (adult) (pediatric): Secondary | ICD-10-CM | POA: Diagnosis not present

## 2020-10-31 DIAGNOSIS — I10 Essential (primary) hypertension: Secondary | ICD-10-CM | POA: Diagnosis not present

## 2020-10-31 DIAGNOSIS — I5033 Acute on chronic diastolic (congestive) heart failure: Secondary | ICD-10-CM | POA: Diagnosis not present

## 2020-10-31 LAB — BASIC METABOLIC PANEL
Anion gap: 12 (ref 5–15)
BUN: 17 mg/dL (ref 8–23)
CO2: 34 mmol/L — ABNORMAL HIGH (ref 22–32)
Calcium: 9.4 mg/dL (ref 8.9–10.3)
Chloride: 88 mmol/L — ABNORMAL LOW (ref 98–111)
Creatinine, Ser: 1.16 mg/dL (ref 0.61–1.24)
GFR, Estimated: >60 mL/min (ref >60)
Glucose, Bld: 125 mg/dL — ABNORMAL HIGH (ref 70–99)
Potassium: 3.6 mmol/L (ref 3.5–5.1)
Sodium: 134 mmol/L — ABNORMAL LOW (ref 135–145)

## 2020-10-31 LAB — CBC WITH DIFFERENTIAL/PLATELET
Abs Immature Granulocytes: 0.03 10*3/uL (ref 0.00–0.07)
Basophils Absolute: 0 10*3/uL (ref 0.0–0.1)
Basophils Relative: 0 %
Eosinophils Absolute: 0.2 10*3/uL (ref 0.0–0.5)
Eosinophils Relative: 2 %
HCT: 38.8 % — ABNORMAL LOW (ref 39.0–52.0)
Hemoglobin: 11.7 g/dL — ABNORMAL LOW (ref 13.0–17.0)
Immature Granulocytes: 0 %
Lymphocytes Relative: 16 %
Lymphs Abs: 1.2 10*3/uL (ref 0.7–4.0)
MCH: 23.6 pg — ABNORMAL LOW (ref 26.0–34.0)
MCHC: 30.2 g/dL (ref 30.0–36.0)
MCV: 78.4 fL — ABNORMAL LOW (ref 80.0–100.0)
Monocytes Absolute: 0.7 10*3/uL (ref 0.1–1.0)
Monocytes Relative: 9 %
Neutro Abs: 5.6 10*3/uL (ref 1.7–7.7)
Neutrophils Relative %: 73 %
Platelets: 151 10*3/uL (ref 150–400)
RBC: 4.95 MIL/uL (ref 4.22–5.81)
RDW: 16.7 % — ABNORMAL HIGH (ref 11.5–15.5)
WBC: 7.6 10*3/uL (ref 4.0–10.5)
nRBC: 0 % (ref 0.0–0.2)

## 2020-10-31 LAB — MAGNESIUM: Magnesium: 2.3 mg/dL (ref 1.7–2.4)

## 2020-10-31 LAB — GLUCOSE, CAPILLARY: Glucose-Capillary: 138 mg/dL — ABNORMAL HIGH (ref 70–99)

## 2020-10-31 MED ORDER — POTASSIUM CHLORIDE CRYS ER 20 MEQ PO TBCR
40.0000 meq | EXTENDED_RELEASE_TABLET | Freq: Once | ORAL | Status: AC
  Administered 2020-10-31: 40 meq via ORAL
  Filled 2020-10-31: qty 2

## 2020-10-31 NOTE — Progress Notes (Addendum)
Patient ID: Charles Gray, male   DOB: 1955/07/09, 65 y.o.   MRN: [id number]     Advanced Heart Failure Rounding Note  PCP-Cardiologist: Kardie Tobb, DO   Subjective:    Diuresing well. Weight trending down. No dyspnea at rest. Denies chest pain. Reports cramping in extremities.   Echo: EF 60-65%, mild LVH, normal RV, unable to estimate PA systolic pressure, IVC not visualized.   Objective:   Weight Range: (!) 167.7 kg Body mass index is 38.67 kg/m.   Vital Signs:   Temp:  [98.2 F (36.8 C)-99.1 F (37.3 C)] 98.2 F (36.8 C) (07/21 0406) Pulse Rate:  [61-70] 61 (07/21 0406) Resp:  [14-18] 18 (07/21 0406) BP: (112-136)/(72-85) 136/85 (07/21 0406) SpO2:  [95 %-100 %] 100 % (07/21 0406) Weight:  [167.7 kg] 167.7 kg (07/21 0358) Last BM Date: 10/30/20  Weight change: Filed Weights   10/28/20 1331 10/30/20 0035 10/31/20 0358  Weight: (!) 176.9 kg (!) 169.1 kg (!) 167.7 kg    Intake/Output:   Intake/Output Summary (Last 24 hours) at 10/31/2020 0756 Last data filed at 10/31/2020 8469 Gross per 24 hour  Intake 1278 ml  Output 3620 ml  Net -2342 ml      Physical Exam    General: Obese, WM, appears stated age. No distress. Neck: JVP difficult to assess but appears elevated, no thyromegaly or thyroid nodule.  Lungs: Clear to auscultation bilaterally with normal respiratory effort. CV: Nondisplaced PMI.  Heart regular S1/S2, no S3/S4, no murmur.   Abdomen: Soft, nontender, no hepatosplenomegaly, no distention.  Skin: Intact without lesions or rashes.  Neurologic: Alert and oriented x 3.  Psych: Normal affect. Extremities: No clubbing or cyanosis. 1+ ankle edema. Venous stasis changes noted.  HEENT: Normal.    Telemetry   NSR 60s-70s (personally reviewed)  Labs    CBC Recent Labs    10/28/20 1338 10/31/20 0352  WBC 7.4 7.6  NEUTROABS 5.8 5.6  HGB 11.1* 11.7*  HCT 37.6* 38.8*  MCV 80.0 78.4*  PLT 146* 629   Basic Metabolic Panel Recent Labs     10/29/20 0352 10/30/20 0246 10/31/20 0352  NA 137 135 134*  K 3.9 3.8 3.6  CL 98 94* 88*  CO2 29 32 34*  GLUCOSE 104* 115* 125*  BUN 10 12 17   CREATININE 0.90 0.96 1.16  CALCIUM 9.5 9.3 9.4  MG 2.4  --   --   PHOS 3.8  --   --    Liver Function Tests No results for input(s): AST, ALT, ALKPHOS, BILITOT, PROT, ALBUMIN in the last 72 hours. No results for input(s): LIPASE, AMYLASE in the last 72 hours. Cardiac Enzymes No results for input(s): CKTOTAL, CKMB, CKMBINDEX, TROPONINI in the last 72 hours.  BNP: BNP (last 3 results) Recent Labs    10/12/20 0615 10/17/20 1050 10/28/20 1338  BNP 33.4 49.9 28.1    ProBNP (last 3 results) No results for input(s): PROBNP in the last 8760 hours.   D-Dimer No results for input(s): DDIMER in the last 72 hours. Hemoglobin A1C No results for input(s): HGBA1C in the last 72 hours. Fasting Lipid Panel No results for input(s): CHOL, HDL, LDLCALC, TRIG, CHOLHDL, LDLDIRECT in the last 72 hours. Thyroid Function Tests No results for input(s): TSH, T4TOTAL, T3FREE, THYROIDAB in the last 72 hours.  Invalid input(s): FREET3  Other results:   Imaging    No results found.   Medications:     Scheduled Medications:  apixaban  5 mg Oral BID  buprenorphine-naloxone  1 tablet Sublingual BID   furosemide  80 mg Intravenous BID   gabapentin  800 mg Oral TID   metolazone  5 mg Oral Daily   metoprolol succinate  25 mg Oral BID   pantoprazole  40 mg Oral BID   polyethylene glycol  17 g Oral Daily   potassium chloride SA  80 mEq Oral BID   pravastatin  20 mg Oral Daily   senna  2 tablet Oral Daily   sertraline  25 mg Oral Daily   sodium chloride flush  3 mL Intravenous Q12H   spironolactone  50 mg Oral Daily   tamsulosin  0.4 mg Oral Daily   zolpidem  10 mg Oral QHS    Infusions:   PRN Medications: acetaminophen, albuterol, ondansetron, sodium chloride   Assessment/Plan   1. Acute on chronic diastolic CHF:  -Suspect  prominent RV dysfunction.  Echo this admission with EF 60-65%, mild LVH, normal RV, unable to estimate PA systolic pressure, IVC not visualized.   -Multiple CHF admissions despite aggressive outpatient diuretic regimen and paramedicine.   -He had IV Lasix in clinic twice in the last couple weeks.   -Creatinine 0.96 > 1.16.  He is diuresing now. Volume status difficult to assess but likely still overloaded. Unna boots. Lasix 80 mg IV bid + metolazone 5 mg again today.   - Replace K aggressively. Check mag. - He did not tolerate Jardiance due to GI upset. - Continue spironolactone 50 mg daily.  - RHC prior to discharge.  - He is failing outpatient management with multiple readmissions. He has refused Cardiomems in the past.  I strongly suggested Cardiomems placement to allow [place] to monitor his volume status more effectively.  He is scared of getting an implanted device.  We will review this further with his son at his request. Patient would like to speak with someone who's had the Cardiomems placed. Will see if this can be arranged. 2. H/o PE/DVT: Suspect due to sedentary lifestyle.  Occurred in 2/21.  V/Q scan in 6/21 did not show evidence for chronic PE.   - Continue apixaban. 3. OSA: Cannot tolerate CPAP. 4. Anxiety/depression: This is a major issue.   - Continue sertraline to 50 mg daily.  - Need to get him set up with an outpatient psychiatrist.  5. CAD: Cath 1/22 with no significant CAD. 6. Hypertension: Stable. - Continue Toprol XL 25 mg twice a day.   7. Atrial fibrillation: Paroxysmal. He is in NSR.  - Continue apixaban.  - Continue Toprol XL 8. Fall versus syncope: Unclear what happened just prior to admission.  It may have been a mechanical fall. He is not certain if he actually lost consciousness. Unsteady.  - Watch on telemetry, no events so far.   Length of Stay: 3  Charles Gray, Charles N, PA-C  10/31/2020, 7:56 AM  Advanced Heart Failure Team Pager [phone] (M-F; 7a - 5p)  Please  contact [facility] for night-coverage after hours (5p -7a ) and weekends on [email or web address]   Patient seen with PA, agree with the above note.   He diuresed well yesterday, weight is coming down.  Still short of breath breath.   General: NAD Neck: JVP difficult (probably elevated), no thyromegaly or thyroid nodule.  Lungs: Clear to auscultation bilaterally with normal respiratory effort. CV: Nondisplaced PMI.  Heart regular S1/S2, no S3/S4, no murmur.  Chronic LE edema to knees.  Abdomen: Soft, nontender, no hepatosplenomegaly, no distention.  Skin: Intact without lesions  or rashes.  Neurologic: Alert and oriented x 3.  Psych: Normal affect. Extremities: No clubbing or cyanosis.  HEENT: Normal.   I will continue Lasix again, 80 mg IV bid and metolazone 5 mg x 1 again.  He is still thinking about Cardiomems, wants to talk to son about it.   Loralie Champagne 10/31/2020 10:42 AM

## 2020-10-31 NOTE — Evaluation (Signed)
Occupational Therapy Evaluation Patient Details Name: Charles Gray MRN: [id number] DOB: 06-02-1955 Today's Date: 10/31/2020    History of Present Illness Charles Gray is a 65 y.o. male with medical history significant of chronic diastolic CHF, chronic right-sided CHF, HTN, PAF on Eliquis, morbid obesity, chronic ambulation dysfunction, presented with syncope and fall.  Found to have acute nondisplaced intra-articular fracture at the base of the first  proximal phalanx, buddy taped.   Clinical Impression   Pt lives alone in an apartment but has an aide that will assist the pt with showers and IADLS in the home.  They were able to complete UE ADLS with min guard and LE ADLS with min assist as they reported cramping in BUE limiting to use.  Pt currently with functional limitations due to the deficits listed below (see OT Problem List).  Pt will benefit from skilled OT to increase their safety and independence with ADL and functional mobility for ADL to facilitate discharge to venue listed below.      Follow Up Recommendations  Home health OT;Supervision - Intermittent (reporting needing increase in time with aide if possible)    Equipment Recommendations       Recommendations for Other Services       Precautions / Restrictions Precautions Precautions: Fall Restrictions Weight Bearing Restrictions: Yes Other Position/Activity Restrictions: per MD, patient can be WBAT on L LE      Mobility Bed Mobility Overal bed mobility:  (sitting EOB)             General bed mobility comments: patient received sitting up on the side of bed.    Transfers Overall transfer level: Needs assistance Equipment used: Rolling walker (2 wheeled) Transfers: Sit to/from Stand Sit to Stand: Supervision         General transfer comment: from elevated surface    Balance Overall balance assessment: Needs assistance Sitting-balance support: Feet supported Sitting balance-Leahy Scale:  Normal     Standing balance support: Bilateral upper extremity supported;During functional activity Standing balance-Leahy Scale: Fair Standing balance comment: reliant on support                           ADL either performed or assessed with clinical judgement   ADL Overall ADL's : Needs assistance/impaired Eating/Feeding: Independent;Sitting   Grooming: Wash/dry hands;Wash/dry face;Set up;Sitting   Upper Body Bathing: Min guard;Sitting;Standing   Lower Body Bathing: Minimal assistance;Cueing for safety;Cueing for sequencing;Sit to/from stand   Upper Body Dressing : Min guard;Cueing for safety;Cueing for sequencing;Sitting   Lower Body Dressing: Minimal assistance;Cueing for safety;Cueing for sequencing;Sit to/from stand   Toilet Transfer: Min guard;Cueing for safety;Cueing for sequencing;Grab bars;RW   Toileting- Water quality scientist and Hygiene: Minimal assistance;Cueing for safety;Cueing for sequencing;Sit to/from stand       Functional mobility during ADLs: Min guard;Rolling walker;Cueing for safety;Cueing for sequencing       Vision         Perception     Praxis      Pertinent Vitals/Pain Pain Assessment: 0-10 Pain Score: 7  Pain Location: BUE due to cramping, Rfoot and L knee Pain Descriptors / Indicators: Aching;Cramping Pain Intervention(s): Limited activity within patient's tolerance     Hand Dominance Right   Extremity/Trunk Assessment Upper Extremity Assessment Upper Extremity Assessment: RUE deficits/detail;LUE deficits/detail RUE Deficits / Details: limited to ROM as pt reprots as they move UE more they will have increase in cramping LUE Deficits / Details: limited to ROM as pt  reprots as they move UE more they will have increase in cramping   Lower Extremity Assessment Lower Extremity Assessment: Defer to PT evaluation;RLE deficits/detail   Cervical / Trunk Assessment Cervical / Trunk Assessment: Kyphotic   Communication  Communication Communication: No difficulties   Cognition Arousal/Alertness: Awake/alert Behavior During Therapy: WFL for tasks assessed/performed Overall Cognitive Status: Within Functional Limits for tasks assessed                                     General Comments       Exercises     Shoulder Instructions      Home Living Family/patient expects to be discharged to:: Private residence Living Arrangements: Alone Available Help at Discharge: Personal care attendant Type of Home: Apartment Home Access: Stairs to enter [employer] of Steps: 20+ Entrance Stairs-Rails: Right;Left;Can reach both Home Layout: One level     Bathroom Shower/Tub: Teacher, early years/pre: Handicapped height Bathroom Accessibility: Yes How Accessible: Accessible via walker Home Equipment: Tub bench;Walker - 2 wheels;Hand held shower head;Hospital bed   Additional Comments: Pt reports now not able to stay at home      Prior Functioning/Environment Level of Independence: Needs assistance  Gait / Transfers Assistance Needed: using walker for gait, sleeps in recliner ADL's / Homemaking Assistance Needed: aide assists with getting in/out of tub/bathing and dressing assist 4x/wk, assist for homemaking, groceries delivered   Comments: Has HH aide 1.5 hrs 4 days/week        OT Problem List: Decreased strength;Decreased range of motion;Decreased activity tolerance;Impaired balance (sitting and/or standing);Decreased safety awareness;Decreased knowledge of use of DME or AE;Cardiopulmonary status limiting activity;Pain      OT Treatment/Interventions: Self-care/ADL training;Therapeutic exercise;Therapeutic activities;Patient/family education;Balance training    OT Goals(Current goals can be found in the care plan section) Acute Rehab OT Goals Patient Stated Goal: to have no more cramping OT Goal Formulation: With patient Time For Goal Achievement:  11/16/20 Potential to Achieve Goals: Good ADL Goals Pt Will Perform Lower Body Bathing: with set-up;sit to/from stand Pt Will Transfer to Toilet: with modified independence;ambulating;regular height toilet;grab bars Pt Will Perform Tub/Shower Transfer: Tub transfer;with supervision;tub bench  OT Frequency: Min 2X/week   Barriers to D/C:            Co-evaluation              AM-PAC OT "6 Clicks" Daily Activity     Outcome Measure Help from another person eating meals?: None Help from another person taking care of personal grooming?: None Help from another person toileting, which includes using toliet, bedpan, or urinal?: A Little Help from another person bathing (including washing, rinsing, drying)?: A Little Help from another person to put on and taking off regular upper body clothing?: A Little Help from another person to put on and taking off regular lower body clothing?: A Little 6 Click Score: 20   End of Session Equipment Utilized During Treatment: Gait belt;Rolling walker Nurse Communication: Mobility status;Other (comment) (bandages needd for RLE)  Activity Tolerance: Other (comment) (cramping in BUE) Patient left: in chair;with call bell/phone within reach  OT Visit Diagnosis: Unsteadiness on feet (R26.81);Other abnormalities of gait and mobility (R26.89);Repeated falls (R29.6);Muscle weakness (generalized) (M62.81);History of falling (Z91.81);Pain Pain - Right/Left: Right Pain - part of body:  (foot)                Time: 5329-9242 OT Time  Calculation (min): 37 min Charges:  OT General Charges $OT Visit: 1 Visit OT Evaluation $OT Eval Low Complexity: Gillis OTR/L  Acute Rehab Services  [phone] office number [phone] pager number   Joeseph Amor 10/31/2020, 12:03 PM

## 2020-10-31 NOTE — Telephone Encounter (Signed)
Pt was set up to speak with current  (TA) cardiomems patient for reviews, insights, education etc.

## 2020-10-31 NOTE — Progress Notes (Signed)
PROGRESS NOTE    Charles Gray  INO:[id number] DOB: [birth year] DOA: 10/28/2020 PCP: Enid Skeens., MD    Brief Narrative:  65 year old male past medical history for chronic diastolic heart failure, hypertension, paroxysmal atrial fibrillation and obesity class III who presented after a syncope episode. He was recently diagnosed with CHF exacerbation and his torsemide was increased from 80 mg twice daily to 100 mg twice daily along with metolazone twice weekly.  Despite adjustments in his diuretic therapy he continued to gain weight and developed worsening lower extremity edema.  Feeling generalized weakness and lightheadedness, on the day of hospitalization while going to the bathroom he stood up and developed an orthostatic syncope. CT head and cervical spine negative for acute changes. Chest radiograph with cardiomegaly, hilar vascular congestion, left peribronchial cuffing.  Patient admitted for further management.    Assessment & Plan:   Principal Problem:   CHF (congestive heart failure) (HCC) Active Problems:   Pulmonary embolism (HCC)   OSA (obstructive sleep apnea)   AF (paroxysmal atrial fibrillation) ([place])   Essential hypertension   Morbid obesity with BMI of 40.0-44.9, adult (HCC)   Syncope  Acute on chronic diastolic heart failure decompensation Multiple CHF admissions despite aggressive outpatient diuretic regimen Echo with EF of 60 to 65%, IVC not visualized LHC done 1/22 with no significant CAD Cardiology/HF on board, plan for possible RHC prior to discharge, and Cardiomems placement (patient currently refusing, wants more info) Continue aggressive diuresis with furosemide 80 mg Iv q12 hrs, along with metolazone Continue spironolactone 50 mg daily Strict I's and O's, daily weights  Hx of PE/DVT VQ scan in 6/21 did not show any evidence of chronic PE Continue apixaban  HTN  Continue Toprol  Paroxysmal atrial fibrillation   Continue rate control with  metoprolol XL 25 mg po bid.  Anticoagulation with apixaban  Chronic pain syndrome Continue pain control with soboxone  Possible syncope with fall Reported he passed out in the morning PTA and fell for about a couple of minutes Management as above PT/OT Fall precautions  Acute right foot non displaced intra-articular fracture at the base of the first proximal phalanx. Continue pain control for now PT/OT Fall precautions  OSA Unable to tolerate CPAP  Obesity Lifestyle modification advised  Anxiety/depression Continue home regimen     Status is: Inpatient  Remains inpatient appropriate because:Inpatient level of care appropriate due to severity of illness  Dispo: The patient is from: Home              Anticipated d/c is to: Home              Patient currently is not medically stable to d/c.   Difficult to place patient No   DVT prophylaxis: Apixaban   Code Status:    full  Family Communication:  No family at the bedside        Consultants:  Cardiology    Subjective: Patient requesting more food during breakfast.  Still with no BM.  Denies any chest pain, abdominal pain, nausea/vomiting, fever/chills.   Objective: Vitals:   10/31/20 0358 10/31/20 0406 10/31/20 0939 10/31/20 1056  BP:  136/85 119/62 137/83  Pulse:  61 65 68  Resp:  18  20  Temp:  98.2 F (36.8 C) 98.1 F (36.7 C) 97.6 F (36.4 C)  TempSrc:  Oral Oral Oral  SpO2:  100% 97% 98%  Weight: (!) 167.7 kg     Height:        Intake/Output Summary (  Last 24 hours) at 10/31/2020 1424 Last data filed at 10/31/2020 1301 Gross per 24 hour  Intake 1692 ml  Output 3500 ml  Net -1808 ml   Filed Weights   10/28/20 1331 10/30/20 0035 10/31/20 0358  Weight: (!) 176.9 kg (!) 169.1 kg (!) 167.7 kg    Examination:  General: NAD, deconditioned Cardiovascular: S1, S2 present Respiratory: Bilateral rales noted Abdomen: Soft, nontender, nondistended, bowel sounds present Musculoskeletal: 2+  bilateral pedal edema noted Skin: Normal Psychiatry: Normal mood       Data Reviewed: I have personally reviewed following labs and imaging studies  CBC: Recent Labs  Lab 10/28/20 1338 10/31/20 0352  WBC 7.4 7.6  NEUTROABS 5.8 5.6  HGB 11.1* 11.7*  HCT 37.6* 38.8*  MCV 80.0 78.4*  PLT 146* 161   Basic Metabolic Panel: Recent Labs  Lab 10/28/20 1338 10/29/20 0352 10/30/20 0246 10/31/20 0352  NA 137 137 135 134*  K 3.8 3.9 3.8 3.6  CL 97* 98 94* 88*  CO2 31 29 32 34*  GLUCOSE 130* 104* 115* 125*  BUN 10 10 12 17   CREATININE 0.92 0.90 0.96 1.16  CALCIUM 9.3 9.5 9.3 9.4  MG  --  2.4  --  2.3  PHOS  --  3.8  --   --    GFR: Estimated Creatinine Clearance: 115.9 mL/min (by C-G formula based on SCr of 1.16 mg/dL). Liver Function Tests: No results for input(s): AST, ALT, ALKPHOS, BILITOT, PROT, ALBUMIN in the last 168 hours. No results for input(s): LIPASE, AMYLASE in the last 168 hours. No results for input(s): AMMONIA in the last 168 hours. Coagulation Profile: No results for input(s): INR, PROTIME in the last 168 hours. Cardiac Enzymes: No results for input(s): CKTOTAL, CKMB, CKMBINDEX, TROPONINI in the last 168 hours. BNP (last 3 results) No results for input(s): PROBNP in the last 8760 hours. HbA1C: No results for input(s): HGBA1C in the last 72 hours. CBG: Recent Labs  Lab 10/29/20 0507 10/30/20 0604 10/31/20 0558  GLUCAP 163* 130* 138*   Lipid Profile: No results for input(s): CHOL, HDL, LDLCALC, TRIG, CHOLHDL, LDLDIRECT in the last 72 hours. Thyroid Function Tests: No results for input(s): TSH, T4TOTAL, FREET4, T3FREE, THYROIDAB in the last 72 hours. Anemia Panel: No results for input(s): VITAMINB12, FOLATE, FERRITIN, TIBC, IRON, RETICCTPCT in the last 72 hours.    Radiology Studies: I have reviewed all of the imaging during this hospital visit personally     Scheduled Meds:  apixaban  5 mg Oral BID   buprenorphine-naloxone  1 tablet  Sublingual BID   furosemide  80 mg Intravenous BID   gabapentin  800 mg Oral TID   metolazone  5 mg Oral Daily   metoprolol succinate  25 mg Oral BID   pantoprazole  40 mg Oral BID   polyethylene glycol  17 g Oral Daily   potassium chloride SA  80 mEq Oral BID   pravastatin  20 mg Oral Daily   senna  2 tablet Oral Daily   sertraline  25 mg Oral Daily   sodium chloride flush  3 mL Intravenous Q12H   spironolactone  50 mg Oral Daily   tamsulosin  0.4 mg Oral Daily   zolpidem  10 mg Oral QHS   Continuous Infusions:   LOS: 3 days        Alma Friendly, MD

## 2020-11-01 ENCOUNTER — Inpatient Hospital Stay (HOSPITAL_COMMUNITY)
Admission: EM | Disposition: A | Discharge status: Home Health | Payer: Self-pay | Source: Home / Self Care | Attending: Internal Medicine

## 2020-11-01 DIAGNOSIS — G4733 Obstructive sleep apnea (adult) (pediatric): Secondary | ICD-10-CM | POA: Diagnosis not present

## 2020-11-01 DIAGNOSIS — I10 Essential (primary) hypertension: Secondary | ICD-10-CM | POA: Diagnosis not present

## 2020-11-01 DIAGNOSIS — I480 Paroxysmal atrial fibrillation: Secondary | ICD-10-CM | POA: Diagnosis not present

## 2020-11-01 DIAGNOSIS — I5033 Acute on chronic diastolic (congestive) heart failure: Secondary | ICD-10-CM | POA: Diagnosis not present

## 2020-11-01 DIAGNOSIS — I5030 Unspecified diastolic (congestive) heart failure: Secondary | ICD-10-CM | POA: Diagnosis not present

## 2020-11-01 HISTORY — PX: RIGHT HEART CATH: CATH118263

## 2020-11-01 LAB — GLUCOSE, CAPILLARY: Glucose-Capillary: 124 mg/dL — ABNORMAL HIGH (ref 70–99)

## 2020-11-01 LAB — CBC WITH DIFFERENTIAL/PLATELET
Abs Immature Granulocytes: 0.04 10*3/uL (ref 0.00–0.07)
Basophils Absolute: 0 10*3/uL (ref 0.0–0.1)
Basophils Relative: 0 %
Eosinophils Absolute: 0.2 10*3/uL (ref 0.0–0.5)
Eosinophils Relative: 2 %
HCT: 38.1 % — ABNORMAL LOW (ref 39.0–52.0)
Hemoglobin: 11.6 g/dL — ABNORMAL LOW (ref 13.0–17.0)
Immature Granulocytes: 1 %
Lymphocytes Relative: 15 %
Lymphs Abs: 1.2 10*3/uL (ref 0.7–4.0)
MCH: 23.6 pg — ABNORMAL LOW (ref 26.0–34.0)
MCHC: 30.4 g/dL (ref 30.0–36.0)
MCV: 77.4 fL — ABNORMAL LOW (ref 80.0–100.0)
Monocytes Absolute: 0.8 10*3/uL (ref 0.1–1.0)
Monocytes Relative: 10 %
Neutro Abs: 5.8 10*3/uL (ref 1.7–7.7)
Neutrophils Relative %: 72 %
Platelets: 154 10*3/uL (ref 150–400)
RBC: 4.92 MIL/uL (ref 4.22–5.81)
RDW: 16.5 % — ABNORMAL HIGH (ref 11.5–15.5)
WBC: 8.1 10*3/uL (ref 4.0–10.5)
nRBC: 0 % (ref 0.0–0.2)

## 2020-11-01 LAB — POCT I-STAT EG7
Acid-Base Excess: 11 mmol/L — ABNORMAL HIGH (ref 0.0–2.0)
Acid-Base Excess: 9 mmol/L — ABNORMAL HIGH (ref 0.0–2.0)
Bicarbonate: 35.4 mmol/L — ABNORMAL HIGH (ref 20.0–28.0)
Bicarbonate: 37.7 mmol/L — ABNORMAL HIGH (ref 20.0–28.0)
Calcium, Ion: 1.15 mmol/L (ref 1.15–1.40)
Calcium, Ion: 1.18 mmol/L (ref 1.15–1.40)
HCT: 39 % (ref 39.0–52.0)
HCT: 39 % (ref 39.0–52.0)
Hemoglobin: 13.3 g/dL (ref 13.0–17.0)
Hemoglobin: 13.3 g/dL (ref 13.0–17.0)
O2 Saturation: 63 %
O2 Saturation: 66 %
Potassium: 3.5 mmol/L (ref 3.5–5.1)
Potassium: 3.7 mmol/L (ref 3.5–5.1)
Sodium: 138 mmol/L (ref 135–145)
Sodium: 138 mmol/L (ref 135–145)
TCO2: 37 mmol/L — ABNORMAL HIGH (ref 22–32)
TCO2: 39 mmol/L — ABNORMAL HIGH (ref 22–32)
pCO2, Ven: 54.2 mmHg (ref 44.0–60.0)
pCO2, Ven: 57.5 mmHg (ref 44.0–60.0)
pH, Ven: 7.423 (ref 7.250–7.430)
pH, Ven: 7.424 (ref 7.250–7.430)
pO2, Ven: 33 mmHg (ref 32.0–45.0)
pO2, Ven: 34 mmHg (ref 32.0–45.0)

## 2020-11-01 LAB — BASIC METABOLIC PANEL
Anion gap: 11 (ref 5–15)
BUN: 16 mg/dL (ref 8–23)
CO2: 32 mmol/L (ref 22–32)
Calcium: 9.5 mg/dL (ref 8.9–10.3)
Chloride: 91 mmol/L — ABNORMAL LOW (ref 98–111)
Creatinine, Ser: 1.01 mg/dL (ref 0.61–1.24)
GFR, Estimated: >60 mL/min (ref >60)
Glucose, Bld: 121 mg/dL — ABNORMAL HIGH (ref 70–99)
Potassium: 3.4 mmol/L — ABNORMAL LOW (ref 3.5–5.1)
Sodium: 134 mmol/L — ABNORMAL LOW (ref 135–145)

## 2020-11-01 SURGERY — RIGHT HEART CATH
Anesthesia: LOCAL

## 2020-11-01 MED ORDER — HEPARIN (PORCINE) IN NACL 1000-0.9 UT/500ML-% IV SOLN
INTRAVENOUS | Status: DC | PRN
  Administered 2020-11-01: 500 mL

## 2020-11-01 MED ORDER — SODIUM CHLORIDE 0.9% FLUSH: 3.0000 mL | INTRAVENOUS | Status: DC | PRN

## 2020-11-01 MED ORDER — POLYETHYLENE GLYCOL 3350 17 G PO PACK
17.0000 g | PACK | Freq: Two times a day (BID) | ORAL | Status: DC
  Administered 2020-11-01 – 2020-11-02 (×2): 17 g via ORAL
  Filled 2020-11-01 (×2): qty 1

## 2020-11-01 MED ORDER — METOLAZONE 2.5 MG PO TABS: 2.5000 mg | ORAL_TABLET | ORAL | Status: DC

## 2020-11-01 MED ORDER — LIDOCAINE HCL (PF) 1 % IJ SOLN
INTRAMUSCULAR | Status: DC | PRN
  Administered 2020-11-01: 2 mL via INTRADERMAL

## 2020-11-01 MED ORDER — MIDAZOLAM HCL 2 MG/2ML IJ SOLN
INTRAMUSCULAR | Status: AC
  Filled 2020-11-01: qty 2

## 2020-11-01 MED ORDER — SODIUM CHLORIDE 0.9 % IV SOLN: INTRAVENOUS | Status: DC

## 2020-11-01 MED ORDER — POTASSIUM CHLORIDE CRYS ER 20 MEQ PO TBCR
40.0000 meq | EXTENDED_RELEASE_TABLET | ORAL | Status: AC
  Administered 2020-11-01: 40 meq via ORAL
  Filled 2020-11-01: qty 2

## 2020-11-01 MED ORDER — APIXABAN 5 MG PO TABS
5.0000 mg | ORAL_TABLET | Freq: Two times a day (BID) | ORAL | Status: DC
  Administered 2020-11-01 – 2020-11-02 (×2): 5 mg via ORAL
  Filled 2020-11-01 (×2): qty 1

## 2020-11-01 MED ORDER — LIDOCAINE IN D5W 4-5 MG/ML-% IV SOLN
INTRAVENOUS | Status: AC
  Filled 2020-11-01: qty 500

## 2020-11-01 MED ORDER — SODIUM CHLORIDE 0.9 % IV SOLN: 250.0000 mL | INTRAVENOUS | Status: DC | PRN

## 2020-11-01 MED ORDER — SODIUM CHLORIDE 0.9% FLUSH
3.0000 mL | Freq: Two times a day (BID) | INTRAVENOUS | Status: DC
  Administered 2020-11-01: 3 mL via INTRAVENOUS

## 2020-11-01 MED ORDER — MIDAZOLAM HCL 2 MG/2ML IJ SOLN
INTRAMUSCULAR | Status: DC | PRN
  Administered 2020-11-01: 0.5 mg via INTRAVENOUS

## 2020-11-01 MED ORDER — TORSEMIDE 100 MG PO TABS
100.0000 mg | ORAL_TABLET | Freq: Two times a day (BID) | ORAL | Status: DC
  Administered 2020-11-02: 100 mg via ORAL
  Filled 2020-11-01: qty 1

## 2020-11-01 MED ORDER — HEPARIN (PORCINE) IN NACL 1000-0.9 UT/500ML-% IV SOLN
INTRAVENOUS | Status: AC
  Filled 2020-11-01: qty 500

## 2020-11-01 MED ORDER — ASPIRIN 81 MG PO CHEW
81.0000 mg | CHEWABLE_TABLET | ORAL | Status: AC
  Administered 2020-11-01: 81 mg via ORAL
  Filled 2020-11-01: qty 1

## 2020-11-01 SURGICAL SUPPLY — 6 items
CATH BALLN WEDGE 5F 110CM (CATHETERS) ×2 IMPLANT
KIT HEART LEFT (KITS) ×2 IMPLANT
MAT PREVALON FULL STRYKER (MISCELLANEOUS) ×2 IMPLANT
PACK CARDIAC CATHETERIZATION (CUSTOM PROCEDURE TRAY) ×2 IMPLANT
SHEATH GLIDE SLENDER 4/5FR (SHEATH) ×2 IMPLANT
TRANSDUCER W/STOPCOCK (MISCELLANEOUS) ×2 IMPLANT

## 2020-11-01 NOTE — Progress Notes (Signed)
Patient going for [place] today. Potentially discharging home over the weekend.  HF follow-up scheduled.  Working on [employer] - will be placed at future date.  Contacting paramedicine for close f/u post discharge.

## 2020-11-01 NOTE — Progress Notes (Signed)
PROGRESS NOTE    Charles Gray  EQA:[id number] DOB: 11/23/1955 DOA: 10/28/2020 PCP: Enid Skeens., MD    Brief Narrative:  65 year old male past medical history for chronic diastolic heart failure, hypertension, paroxysmal atrial fibrillation and obesity class III who presented after a syncope episode. He was recently diagnosed with CHF exacerbation and his torsemide was increased from 80 mg twice daily to 100 mg twice daily along with metolazone twice weekly.  Despite adjustments in his diuretic therapy he continued to gain weight and developed worsening lower extremity edema.  Feeling generalized weakness and lightheadedness, on the day of hospitalization while going to the bathroom he stood up and developed an orthostatic syncope. CT head and cervical spine negative for acute changes. Chest radiograph with cardiomegaly, hilar vascular congestion, left peribronchial cuffing.  Patient admitted for further management.    Assessment & Plan:   Principal Problem:   CHF (congestive heart failure) (HCC) Active Problems:   Pulmonary embolism (HCC)   OSA (obstructive sleep apnea)   AF (paroxysmal atrial fibrillation) ([place])   Essential hypertension   Morbid obesity with BMI of 40.0-44.9, adult (HCC)   Syncope  Acute on chronic diastolic heart failure decompensation Multiple CHF admissions despite aggressive outpatient diuretic regimen Echo with EF of 60 to 65%, IVC not visualized LHC done 1/22 with no significant CAD Cardiology/HF on board, RHC done on 7/22 showed volume status is currently optimized.  Recommend Cardiomems placement (patient currently refusing, wants more info) Switch to torsemide 100 mg twice daily and metolazone 2.5 mg every M/W/F Continue spironolactone 50 mg daily Strict I's and O's, daily weights  Hx of PE/DVT VQ scan in 6/21 did not show any evidence of chronic PE Continue apixaban  HTN  Continue Toprol  Paroxysmal atrial fibrillation   Continue rate  control with metoprolol XL 25 mg po bid.  Anticoagulation with apixaban  Chronic pain syndrome Continue pain control with soboxone  Possible syncope with fall Reported he passed out in the morning PTA, unknown duration after fall Management as above PT/OT Fall precautions  Acute right foot non displaced intra-articular fracture at the base of the first proximal phalanx. Continue pain control for now PT/OT Fall precautions Spoke to orthopedics Hilbert Odor on 11/01/2020, no further recommendation  OSA Unable to tolerate CPAP  Obesity Lifestyle modification advised  Anxiety/depression Continue home regimen     Status is: Inpatient  Remains inpatient appropriate because:Inpatient level of care appropriate due to severity of illness  Dispo: The patient is from: Home              Anticipated d/c is to: Home              Patient currently is not medically stable to d/c.   Difficult to place patient No   DVT prophylaxis: Apixaban   Code Status:    full  Family Communication:  No family at the bedside        Consultants:  Cardiology    Subjective: Patient continues to have multiple complaints, noted to be sleeping on recliner while here in the hospital, unable to lie down flat.  Still unable to have any bowel movements, complains of feeling unwell, generalized weakness.  Denies any chest pain.   Objective: Vitals:   11/01/20 1413 11/01/20 1418 11/01/20 1440 11/01/20 1546  BP: (!) 142/82 (!) 142/78 118/66 135/84  Pulse: 65 72 71   Resp: (!) 0 12    Temp:   98.3 F (36.8 C)   TempSrc:  Oral   SpO2: 100% 100% 98%   Weight:      Height:        Intake/Output Summary (Last 24 hours) at 11/01/2020 1547 Last data filed at 11/01/2020 1310 Gross per 24 hour  Intake 804 ml  Output 2691 ml  Net -1887 ml   Filed Weights   10/31/20 0358 11/01/20 0031 11/01/20 1233  Weight: (!) 167.7 kg (!) 168.3 kg (!) 167 kg    Examination:  General: NAD,  deconditioned Cardiovascular: S1, S2 present Respiratory: Diminished breath sounds bilaterally Abdomen: Soft, nontender, nondistended, bowel sounds present Musculoskeletal: 2+ bilateral pedal edema noted Skin: Normal Psychiatry: Normal mood       Data Reviewed: I have personally reviewed following labs and imaging studies  CBC: Recent Labs  Lab 10/28/20 1338 10/31/20 0352 11/01/20 0253  WBC 7.4 7.6 8.1  NEUTROABS 5.8 5.6 5.8  HGB 11.1* 11.7* 11.6*  HCT 37.6* 38.8* 38.1*  MCV 80.0 78.4* 77.4*  PLT 146* 151 294   Basic Metabolic Panel: Recent Labs  Lab 10/28/20 1338 10/29/20 0352 10/30/20 0246 10/31/20 0352 11/01/20 0253  NA 137 137 135 134* 134*  K 3.8 3.9 3.8 3.6 3.4*  CL 97* 98 94* 88* 91*  CO2 31 29 32 34* 32  GLUCOSE 130* 104* 115* 125* 121*  BUN 10 10 12 17 16   CREATININE 0.92 0.90 0.96 1.16 1.01  CALCIUM 9.3 9.5 9.3 9.4 9.5  MG  --  2.4  --  2.3  --   PHOS  --  3.8  --   --   --    GFR: Estimated Creatinine Clearance: 132.9 mL/min (by C-G formula based on SCr of 1.01 mg/dL). Liver Function Tests: No results for input(s): AST, ALT, ALKPHOS, BILITOT, PROT, ALBUMIN in the last 168 hours. No results for input(s): LIPASE, AMYLASE in the last 168 hours. No results for input(s): AMMONIA in the last 168 hours. Coagulation Profile: No results for input(s): INR, PROTIME in the last 168 hours. Cardiac Enzymes: No results for input(s): CKTOTAL, CKMB, CKMBINDEX, TROPONINI in the last 168 hours. BNP (last 3 results) No results for input(s): PROBNP in the last 8760 hours. HbA1C: No results for input(s): HGBA1C in the last 72 hours. CBG: Recent Labs  Lab 10/29/20 0507 10/30/20 0604 10/31/20 0558 11/01/20 0558  GLUCAP 163* 130* 138* 124*   Lipid Profile: No results for input(s): CHOL, HDL, LDLCALC, TRIG, CHOLHDL, LDLDIRECT in the last 72 hours. Thyroid Function Tests: No results for input(s): TSH, T4TOTAL, FREET4, T3FREE, THYROIDAB in the last 72  hours. Anemia Panel: No results for input(s): VITAMINB12, FOLATE, FERRITIN, TIBC, IRON, RETICCTPCT in the last 72 hours.    Radiology Studies: I have reviewed all of the imaging during this hospital visit personally     Scheduled Meds:  apixaban  5 mg Oral BID   buprenorphine-naloxone  1 tablet Sublingual BID   gabapentin  800 mg Oral TID   [START ON 11/02/2020] metolazone  2.5 mg Oral Q M,W,F   metoprolol succinate  25 mg Oral BID   pantoprazole  40 mg Oral BID   polyethylene glycol  17 g Oral BID   potassium chloride SA  80 mEq Oral BID   pravastatin  20 mg Oral Daily   senna  2 tablet Oral Daily   sertraline  25 mg Oral Daily   sodium chloride flush  3 mL Intravenous Q12H   spironolactone  50 mg Oral Daily   tamsulosin  0.4 mg Oral Daily   [  START ON 11/02/2020] torsemide  100 mg Oral BID   zolpidem  10 mg Oral QHS   Continuous Infusions:   LOS: 4 days        Alma Friendly, MD

## 2020-11-01 NOTE — Progress Notes (Signed)
Physical Therapy Treatment Patient Details Name: Charles Gray MRN: [id number] DOB: 03/11/1956 Today's Date: 11/01/2020    History of Present Illness Charles Gray is a 65 y.o. male with medical history significant of chronic diastolic CHF, chronic right-sided CHF, HTN, PAF on Eliquis, morbid obesity, chronic ambulation dysfunction, presented with syncope and fall.  Found to have acute nondisplaced intra-articular fracture at the base of the first  proximal phalanx, buddy taped.    PT Comments    Patient received sitting up on side of bed. Multiple complaints this am, generally not feeling well, reports he urinated on himself this morning. Reports R shoulder pain/cramping with use of RW. Orthostatic BP taken this am with results documented in chart. Patient performed sit to stand with supervision. Ambulated 75 feet with RW, labored, slow gait. Min guard assist. O2 saturations with ambulation maintained in the mid 90%s on room air.  Patient will continue to benefit from skilled PT while here to improve strength and endurance. Patient has 27 steps to enter apartment. That may be the biggest obstacle for returning home at this time.     Follow Up Recommendations  Home health PT     Equipment Recommendations  Other (comment) (patient would like a rollator)    Recommendations for Other Services       Precautions / Restrictions Precautions Precautions: Fall Restrictions Weight Bearing Restrictions: No Other Position/Activity Restrictions: per MD, patient can be WBAT on L LE    Mobility  Bed Mobility               General bed mobility comments: patient received sitting up on the side of bed.    Transfers Overall transfer level: Needs assistance Equipment used: Rolling walker (2 wheeled) Transfers: Sit to/from Stand Sit to Stand: Supervision            Ambulation/Gait Ambulation/Gait assistance: Min guard Gait Distance (Feet): 75 Feet Assistive device: Rolling  walker (2 wheeled) Gait Pattern/deviations: Trunk flexed;Step-through pattern Gait velocity: decr   General Gait Details: patient reports fatigue and right shoulder cramping with ambulation and leaning on RW. Effortful gait, slow pace.  O2 saturations on room air in mid 90%s throughout session.   Stairs             Wheelchair Mobility    Modified Rankin (Stroke Patients Only)       Balance Overall balance assessment: Needs assistance Sitting-balance support: Feet supported Sitting balance-Leahy Scale: Normal     Standing balance support: Bilateral upper extremity supported;During functional activity Standing balance-Leahy Scale: Fair Standing balance comment: reliant on support                            Cognition Arousal/Alertness: Awake/alert Behavior During Therapy: WFL for tasks assessed/performed;Flat affect Overall Cognitive Status: Within Functional Limits for tasks assessed                                        Exercises      General Comments        Pertinent Vitals/Pain Pain Assessment: Faces Faces Pain Scale: Hurts little more Pain Location: R shoulder, R knee Pain Descriptors / Indicators: Aching;Discomfort;Cramping Pain Intervention(s): Monitored during session;Repositioned    Home Living                      Prior Function  PT Goals (current goals can now be found in the care plan section) Acute Rehab PT Goals Patient Stated Goal: to have no more cramping, feel better PT Goal Formulation: With patient Time For Goal Achievement: 11/06/20 Potential to Achieve Goals: Good Progress towards PT goals: Progressing toward goals    Frequency    Min 3X/week      PT Plan Discharge plan needs to be updated    Co-evaluation              AM-PAC PT "6 Clicks" Mobility   Outcome Measure  Help needed turning from your back to your side while in a flat bed without using bedrails?: A  Little Help needed moving from lying on your back to sitting on the side of a flat bed without using bedrails?: A Little Help needed moving to and from a bed to a chair (including a wheelchair)?: A Little Help needed standing up from a chair using your arms (e.g., wheelchair or bedside chair)?: A Little Help needed to walk in hospital room?: A Little Help needed climbing 3-5 steps with a railing? : A Lot 6 Click Score: 17    End of Session Equipment Utilized During Treatment: Gait belt Activity Tolerance: Patient limited by fatigue;Patient limited by pain Patient left: in bed;with call bell/phone within reach Nurse Communication: Mobility status PT Visit Diagnosis: Other abnormalities of gait and mobility (R26.89);Difficulty in walking, not elsewhere classified (R26.2);Pain;History of falling (Z91.81) Pain - Right/Left: Right Pain - part of body: Shoulder     Time: 3646-8032 PT Time Calculation (min) (ACUTE ONLY): 27 min  Charges:  $Gait Training: 23-37 mins                     Charles Gray, PT, GCS 11/01/20,10:31 AM

## 2020-11-01 NOTE — Progress Notes (Signed)
Orthopedic Tech Progress Note Patient Details:  Charles Gray 25-Apr-1955 [id number]  Ortho Devices Type of Ortho Device: Haematologist Ortho Device/Splint Location: Bilateral Ortho Device/Splint Interventions: Application, Ordered   Post Interventions Patient Tolerated: Well Instructions Provided: Care of device, Adjustment of device  Charles Gray A Charles Gray 11/01/2020, 5:05 PM

## 2020-11-01 NOTE — Progress Notes (Signed)
Patient ID: Charles Gray, male   DOB: 27-Oct-1955, 65 y.o.   MRN: [id number]     Advanced Heart Failure Rounding Note  PCP-Cardiologist: Kardie Tobb, DO   Subjective:    Breathing better today, I/Os negative but not much weight change (overall down 19 lbs).  Creatinine stable.   Echo: EF 60-65%, mild LVH, normal RV, unable to estimate PA systolic pressure, IVC not visualized.   Objective:   Weight Range: (!) 168.3 kg Body mass index is 38.8 kg/m.   Vital Signs:   Temp:  [97.6 F (36.4 C)-98.1 F (36.7 C)] 98 F (36.7 C) (07/22 0354) Pulse Rate:  [61-68] 62 (07/22 0354) Resp:  [14-20] 14 (07/22 0354) BP: (117-137)/(62-83) 117/65 (07/22 0354) SpO2:  [94 %-98 %] 98 % (07/22 0354) Weight:  [168.3 kg] 168.3 kg (07/22 0031) Last BM Date: 10/31/20  Weight change: Filed Weights   10/30/20 0035 10/31/20 0358 11/01/20 0031  Weight: (!) 169.1 kg (!) 167.7 kg (!) 168.3 kg    Intake/Output:   Intake/Output Summary (Last 24 hours) at 11/01/2020 0902 Last data filed at 11/01/2020 0600 Gross per 24 hour  Intake 921 ml  Output 2445 ml  Net -1524 ml      Physical Exam    General: NAD Neck: JVP difficult (thick neck), no thyromegaly or thyroid nodule.  Lungs: Clear to auscultation bilaterally with normal respiratory effort. CV: Nondisplaced PMI.  Heart regular S1/S2, no S3/S4, no murmur.  No peripheral edema.  Abdomen: Soft, nontender, no hepatosplenomegaly, no distention.  Skin: Intact without lesions or rashes.  Neurologic: Alert and oriented x 3.  Psych: Normal affect. Extremities: No clubbing or cyanosis.  HEENT: Normal.   Telemetry   NSR 60s-70s (personally reviewed)  Labs    CBC Recent Labs    10/31/20 0352 11/01/20 0253  WBC 7.6 8.1  NEUTROABS 5.6 5.8  HGB 11.7* 11.6*  HCT 38.8* 38.1*  MCV 78.4* 77.4*  PLT 151 527   Basic Metabolic Panel Recent Labs    10/31/20 0352 11/01/20 0253  NA 134* 134*  K 3.6 3.4*  CL 88* 91*  CO2 34* 32  GLUCOSE 125*  121*  BUN 17 16  CREATININE 1.16 1.01  CALCIUM 9.4 9.5  MG 2.3  --    Liver Function Tests No results for input(s): AST, ALT, ALKPHOS, BILITOT, PROT, ALBUMIN in the last 72 hours. No results for input(s): LIPASE, AMYLASE in the last 72 hours. Cardiac Enzymes No results for input(s): CKTOTAL, CKMB, CKMBINDEX, TROPONINI in the last 72 hours.  BNP: BNP (last 3 results) Recent Labs    10/12/20 0615 10/17/20 1050 10/28/20 1338  BNP 33.4 49.9 28.1    ProBNP (last 3 results) No results for input(s): PROBNP in the last 8760 hours.   D-Dimer No results for input(s): DDIMER in the last 72 hours. Hemoglobin A1C No results for input(s): HGBA1C in the last 72 hours. Fasting Lipid Panel No results for input(s): CHOL, HDL, LDLCALC, TRIG, CHOLHDL, LDLDIRECT in the last 72 hours. Thyroid Function Tests No results for input(s): TSH, T4TOTAL, T3FREE, THYROIDAB in the last 72 hours.  Invalid input(s): FREET3  Other results:   Imaging    No results found.   Medications:     Scheduled Medications:  apixaban  5 mg Oral BID   buprenorphine-naloxone  1 tablet Sublingual BID   furosemide  80 mg Intravenous BID   gabapentin  800 mg Oral TID   metolazone  5 mg Oral Daily   metoprolol succinate  25 mg Oral BID   pantoprazole  40 mg Oral BID   polyethylene glycol  17 g Oral Daily   potassium chloride SA  40 mEq Oral NOW   potassium chloride SA  80 mEq Oral BID   pravastatin  20 mg Oral Daily   senna  2 tablet Oral Daily   sertraline  25 mg Oral Daily   sodium chloride flush  3 mL Intravenous Q12H   spironolactone  50 mg Oral Daily   tamsulosin  0.4 mg Oral Daily   zolpidem  10 mg Oral QHS    Infusions:   PRN Medications: acetaminophen, albuterol, ondansetron, sodium chloride   Assessment/Plan   1. Acute on chronic diastolic CHF:  Suspect prominent RV dysfunction.  Echo this admission with EF 60-65%, mild LVH, normal RV, unable to estimate PA systolic pressure, IVC not  visualized.  Multiple CHF admissions despite aggressive outpatient diuretic regimen and paramedicine.  He had IV Lasix in clinic twice in the last couple weeks.  Creatinine stable and has diuresed reasonably so far.  Exam difficult for volume.  - Hold am Eliquis and plan RHC today (early afternoon) to see if we have fully diuresed him.  Discussed risks/benefits with patient, he agrees to procedure.    - Can continue IV Lasix for now, reassess after cath.  - Replace K aggressively.  - He did not tolerate Jardiance due to GI upset. - Continue spironolactone 50 mg daily.  - He is failing outpatient management with multiple readmissions. He has refused Cardiomems in the past.  I strongly suggested Cardiomems placement to allow [place] to monitor his volume status more effectively.  He is scared of getting an implanted device.  He has spoken to a patient with a Cardiomems device now.  He is now willing to have it implanted.  We will start paperwork with insurance to make sure it is covered, can do as outpatient as hopefully he will go home over weekend. 2. H/o PE/DVT: Suspect due to sedentary lifestyle.  Occurred in 2/21.  V/Q scan in 6/21 did not show evidence for chronic PE.   - Continue apixaban (hold a dose for RHC today). 3. OSA: Cannot tolerate CPAP. 4. Anxiety/depression: This is a major issue.   - Continue sertraline to 50 mg daily.  - Need to get him set up with an outpatient psychiatrist.  5. CAD: Cath 1/22 with no significant CAD. 6. Hypertension: Stable. - Continue Toprol XL 25 mg twice a day.   7. Atrial fibrillation: Paroxysmal. He is in NSR.  - Continue apixaban (hold am dose).  - Continue Toprol XL 8. Fall versus syncope: Unclear what happened just prior to admission.  It may have been a mechanical fall. He is not certain if he actually lost consciousness. Unsteady.  - Watch on telemetry, no events so far.   Length of Stay: [place], MD  11/01/2020, 9:02 AM  Advanced Heart  Failure Team Pager [phone] (M-F; 7a - 5p)  Please contact [facility] for night-coverage after hours (5p -7a ) and weekends on [email or web address]

## 2020-11-01 NOTE — Care Management Important Message (Signed)
Important Message  Patient Details  Name: Charles Gray MRN: [id number] Date of Birth: [birth year]   Medicare Important Message Given:  Yes     Shelda Altes 11/01/2020, 11:27 AM

## 2020-11-01 NOTE — Progress Notes (Deleted)
Patient ID: Charles Gray, male   DOB: November 08, 1955, 65 y.o.   MRN: [id number]     Advanced Heart Failure Rounding Note  PCP-Cardiologist: Kardie Tobb, DO   Subjective:    Weight up 2lb today. -1L last 24 hrs. One unmeasured void d/t an accident.  Dyspnea has improved. No chest pain.   Echo: EF 60-65%, mild LVH, normal RV, unable to estimate PA systolic pressure, IVC not visualized.   Objective:   Weight Range: (!) 168.3 kg Body mass index is 38.8 kg/m.   Vital Signs:   Temp:  [97.6 F (36.4 C)-98.1 F (36.7 C)] 98 F (36.7 C) (07/22 0354) Pulse Rate:  [61-68] 62 (07/22 0354) Resp:  [14-20] 14 (07/22 0354) BP: (117-137)/(62-83) 117/65 (07/22 0354) SpO2:  [94 %-98 %] 98 % (07/22 0354) Weight:  [168.3 kg] 168.3 kg (07/22 0031) Last BM Date: 10/31/20  Weight change: Filed Weights   10/30/20 0035 10/31/20 0358 11/01/20 0031  Weight: (!) 169.1 kg (!) 167.7 kg (!) 168.3 kg    Intake/Output:   Intake/Output Summary (Last 24 hours) at 11/01/2020 0836 Last data filed at 11/01/2020 0600 Gross per 24 hour  Intake 1458 ml  Output 2445 ml  Net -987 ml      Physical Exam    General: Obese, WM, appears stated age. No distress. Neck: JVP difficult to assess but appears elevated, no thyromegaly or thyroid nodule.  Lungs: Clear to auscultation bilaterally with normal respiratory effort. CV: Nondisplaced PMI.  Heart regular S1/S2, no S3/S4, no murmur.   Abdomen: Soft, nontender, no hepatosplenomegaly, no distention.  Skin: Intact without lesions or rashes.  Neurologic: Alert and oriented x 3.  Psych: Normal affect. Extremities: No clubbing or cyanosis. Trace ankle edema. Venous stasis changes noted.  HEENT: Normal.    Telemetry   Sinus rhythm with sinus arrhythmia, 60s-70s (personally reviewed)  Labs    CBC Recent Labs    10/31/20 0352 11/01/20 0253  WBC 7.6 8.1  NEUTROABS 5.6 5.8  HGB 11.7* 11.6*  HCT 38.8* 38.1*  MCV 78.4* 77.4*  PLT 151 865   Basic  Metabolic Panel Recent Labs    10/31/20 0352 11/01/20 0253  NA 134* 134*  K 3.6 3.4*  CL 88* 91*  CO2 34* 32  GLUCOSE 125* 121*  BUN 17 16  CREATININE 1.16 1.01  CALCIUM 9.4 9.5  MG 2.3  --    Liver Function Tests No results for input(s): AST, ALT, ALKPHOS, BILITOT, PROT, ALBUMIN in the last 72 hours. No results for input(s): LIPASE, AMYLASE in the last 72 hours. Cardiac Enzymes No results for input(s): CKTOTAL, CKMB, CKMBINDEX, TROPONINI in the last 72 hours.  BNP: BNP (last 3 results) Recent Labs    10/12/20 0615 10/17/20 1050 10/28/20 1338  BNP 33.4 49.9 28.1    ProBNP (last 3 results) No results for input(s): PROBNP in the last 8760 hours.   D-Dimer No results for input(s): DDIMER in the last 72 hours. Hemoglobin A1C No results for input(s): HGBA1C in the last 72 hours. Fasting Lipid Panel No results for input(s): CHOL, HDL, LDLCALC, TRIG, CHOLHDL, LDLDIRECT in the last 72 hours. Thyroid Function Tests No results for input(s): TSH, T4TOTAL, T3FREE, THYROIDAB in the last 72 hours.  Invalid input(s): FREET3  Other results:   Imaging    No results found.   Medications:     Scheduled Medications:  apixaban  5 mg Oral BID   buprenorphine-naloxone  1 tablet Sublingual BID   furosemide  80 mg  Intravenous BID   gabapentin  800 mg Oral TID   metolazone  5 mg Oral Daily   metoprolol succinate  25 mg Oral BID   pantoprazole  40 mg Oral BID   polyethylene glycol  17 g Oral Daily   potassium chloride SA  40 mEq Oral NOW   potassium chloride SA  80 mEq Oral BID   pravastatin  20 mg Oral Daily   senna  2 tablet Oral Daily   sertraline  25 mg Oral Daily   sodium chloride flush  3 mL Intravenous Q12H   spironolactone  50 mg Oral Daily   tamsulosin  0.4 mg Oral Daily   zolpidem  10 mg Oral QHS    Infusions:   PRN Medications: acetaminophen, albuterol, ondansetron, sodium chloride   Assessment/Plan   1. Acute on chronic diastolic CHF:  -Suspect  prominent RV dysfunction.  Echo this admission with EF 60-65%, mild LVH, normal RV, unable to estimate PA systolic pressure, IVC not visualized.   -Multiple CHF admissions despite aggressive outpatient diuretic regimen and paramedicine.   -He had IV Lasix in clinic twice in the last couple weeks.   -Creatinine stable.  Diuresed with IV lasix + 5 mg metolazone daily. Volume status difficult to assess. Will arrange for RHC this afternoon and decide on need for additional diuresis. -Unna boots.  -Replace K aggressively.  -He did not tolerate Jardiance due to GI upset. -Continue spironolactone 50 mg daily.  -He is failing outpatient management with multiple readmissions. He has refused Cardiomems in the past.  I strongly suggested Cardiomems placement to allow [place] to monitor his volume status more effectively.  He is scared of getting an implanted device but now seems to be considering it. Will start pre-auth process and will likely implant as an outptatient.  2. H/o PE/DVT:  -Suspect due to sedentary lifestyle.  Occurred in 2/21.   -V/Q scan in 6/21 did not show evidence for chronic PE.   -On apixaban - held for RHC  3. OSA:  -Cannot tolerate CPAP.  4. Anxiety/depression:  -This is a major issue.   -Continue sertraline to 50 mg daily.  -Need to get him set up with an outpatient psychiatrist.   5. CAD:  -Cath 1/22 with no significant CAD.  6. Hypertension:  -Stable. -Continue Toprol XL 25 mg twice a day.    7. Atrial fibrillation:  -Paroxysmal. He is in NSR.  -Restart apixaban post RHC. -Continue Toprol XL  8. Fall versus syncope:  -Unclear what happened just prior to admission.  It may have been a mechanical fall. He is not certain if he actually lost consciousness. Unsteady.  -Watch on telemetry, no events so far.   9. Hypokalemia: -K 3.4 today. Replace. -Mag 2.3   Disposition: -Anticipate discharge home 07/23 or 07/24 -Needs home OT -Will arrange clinic f/u   Length of  Stay: 4  Erlean Mealor N, PA-C  11/01/2020, 8:36 AM  Advanced Heart Failure Team Pager [phone] (M-F; 7a - 5p)  Please contact [facility] for night-coverage after hours (5p -7a ) and weekends on [email or web address]

## 2020-11-01 NOTE — H&P (View-Only) (Signed)
Patient ID: Charles Gray, male   DOB: 1955/12/14, 65 y.o.   MRN: [id number]     Advanced Heart Failure Rounding Note  PCP-Cardiologist: Kardie Tobb, DO   Subjective:    Breathing better today, I/Os negative but not much weight change (overall down 19 lbs).  Creatinine stable.   Echo: EF 60-65%, mild LVH, normal RV, unable to estimate PA systolic pressure, IVC not visualized.   Objective:   Weight Range: (!) 168.3 kg Body mass index is 38.8 kg/m.   Vital Signs:   Temp:  [97.6 F (36.4 C)-98.1 F (36.7 C)] 98 F (36.7 C) (07/22 0354) Pulse Rate:  [61-68] 62 (07/22 0354) Resp:  [14-20] 14 (07/22 0354) BP: (117-137)/(62-83) 117/65 (07/22 0354) SpO2:  [94 %-98 %] 98 % (07/22 0354) Weight:  [168.3 kg] 168.3 kg (07/22 0031) Last BM Date: 10/31/20  Weight change: Filed Weights   10/30/20 0035 10/31/20 0358 11/01/20 0031  Weight: (!) 169.1 kg (!) 167.7 kg (!) 168.3 kg    Intake/Output:   Intake/Output Summary (Last 24 hours) at 11/01/2020 0902 Last data filed at 11/01/2020 0600 Gross per 24 hour  Intake 921 ml  Output 2445 ml  Net -1524 ml      Physical Exam    General: NAD Neck: JVP difficult (thick neck), no thyromegaly or thyroid nodule.  Lungs: Clear to auscultation bilaterally with normal respiratory effort. CV: Nondisplaced PMI.  Heart regular S1/S2, no S3/S4, no murmur.  No peripheral edema.  Abdomen: Soft, nontender, no hepatosplenomegaly, no distention.  Skin: Intact without lesions or rashes.  Neurologic: Alert and oriented x 3.  Psych: Normal affect. Extremities: No clubbing or cyanosis.  HEENT: Normal.   Telemetry   NSR 60s-70s (personally reviewed)  Labs    CBC Recent Labs    10/31/20 0352 11/01/20 0253  WBC 7.6 8.1  NEUTROABS 5.6 5.8  HGB 11.7* 11.6*  HCT 38.8* 38.1*  MCV 78.4* 77.4*  PLT 151 734   Basic Metabolic Panel Recent Labs    10/31/20 0352 11/01/20 0253  NA 134* 134*  K 3.6 3.4*  CL 88* 91*  CO2 34* 32  GLUCOSE 125*  121*  BUN 17 16  CREATININE 1.16 1.01  CALCIUM 9.4 9.5  MG 2.3  --    Liver Function Tests No results for input(s): AST, ALT, ALKPHOS, BILITOT, PROT, ALBUMIN in the last 72 hours. No results for input(s): LIPASE, AMYLASE in the last 72 hours. Cardiac Enzymes No results for input(s): CKTOTAL, CKMB, CKMBINDEX, TROPONINI in the last 72 hours.  BNP: BNP (last 3 results) Recent Labs    10/12/20 0615 10/17/20 1050 10/28/20 1338  BNP 33.4 49.9 28.1    ProBNP (last 3 results) No results for input(s): PROBNP in the last 8760 hours.   D-Dimer No results for input(s): DDIMER in the last 72 hours. Hemoglobin A1C No results for input(s): HGBA1C in the last 72 hours. Fasting Lipid Panel No results for input(s): CHOL, HDL, LDLCALC, TRIG, CHOLHDL, LDLDIRECT in the last 72 hours. Thyroid Function Tests No results for input(s): TSH, T4TOTAL, T3FREE, THYROIDAB in the last 72 hours.  Invalid input(s): FREET3  Other results:   Imaging    No results found.   Medications:     Scheduled Medications:  apixaban  5 mg Oral BID   buprenorphine-naloxone  1 tablet Sublingual BID   furosemide  80 mg Intravenous BID   gabapentin  800 mg Oral TID   metolazone  5 mg Oral Daily   metoprolol succinate  25 mg Oral BID   pantoprazole  40 mg Oral BID   polyethylene glycol  17 g Oral Daily   potassium chloride SA  40 mEq Oral NOW   potassium chloride SA  80 mEq Oral BID   pravastatin  20 mg Oral Daily   senna  2 tablet Oral Daily   sertraline  25 mg Oral Daily   sodium chloride flush  3 mL Intravenous Q12H   spironolactone  50 mg Oral Daily   tamsulosin  0.4 mg Oral Daily   zolpidem  10 mg Oral QHS    Infusions:   PRN Medications: acetaminophen, albuterol, ondansetron, sodium chloride   Assessment/Plan   1. Acute on chronic diastolic CHF:  Suspect prominent RV dysfunction.  Echo this admission with EF 60-65%, mild LVH, normal RV, unable to estimate PA systolic pressure, IVC not  visualized.  Multiple CHF admissions despite aggressive outpatient diuretic regimen and paramedicine.  He had IV Lasix in clinic twice in the last couple weeks.  Creatinine stable and has diuresed reasonably so far.  Exam difficult for volume.  - Hold am Eliquis and plan RHC today (early afternoon) to see if we have fully diuresed him.  Discussed risks/benefits with patient, he agrees to procedure.    - Can continue IV Lasix for now, reassess after cath.  - Replace K aggressively.  - He did not tolerate Jardiance due to GI upset. - Continue spironolactone 50 mg daily.  - He is failing outpatient management with multiple readmissions. He has refused Cardiomems in the past.  I strongly suggested Cardiomems placement to allow [place] to monitor his volume status more effectively.  He is scared of getting an implanted device.  He has spoken to a patient with a Cardiomems device now.  He is now willing to have it implanted.  We will start paperwork with insurance to make sure it is covered, can do as outpatient as hopefully he will go home over weekend. 2. H/o PE/DVT: Suspect due to sedentary lifestyle.  Occurred in 2/21.  V/Q scan in 6/21 did not show evidence for chronic PE.   - Continue apixaban (hold a dose for RHC today). 3. OSA: Cannot tolerate CPAP. 4. Anxiety/depression: This is a major issue.   - Continue sertraline to 50 mg daily.  - Need to get him set up with an outpatient psychiatrist.  5. CAD: Cath 1/22 with no significant CAD. 6. Hypertension: Stable. - Continue Toprol XL 25 mg twice a day.   7. Atrial fibrillation: Paroxysmal. He is in NSR.  - Continue apixaban (hold am dose).  - Continue Toprol XL 8. Fall versus syncope: Unclear what happened just prior to admission.  It may have been a mechanical fall. He is not certain if he actually lost consciousness. Unsteady.  - Watch on telemetry, no events so far.   Length of Stay: [place], MD  11/01/2020, 9:02 AM  Advanced Heart  Failure Team Pager [phone] (M-F; 7a - 5p)  Please contact [facility] for night-coverage after hours (5p -7a ) and weekends on [email or web address]

## 2020-11-01 NOTE — Interval H&P Note (Signed)
History and Physical Interval Note:  11/01/2020 1:46 PM  Charles Gray  has presented today for surgery, with the diagnosis of heart failure.  The various methods of treatment have been discussed with the patient and family. After consideration of risks, benefits and other options for treatment, the patient has consented to  Procedure(s): RIGHT HEART CATH (N/A) as a surgical intervention.  The patient's history has been reviewed, patient examined, no change in status, stable for surgery.  I have reviewed the patient's chart and labs.  Questions were answered to the patient's satisfaction.     Gee Habig [employer]

## 2020-11-01 NOTE — TOC Initial Note (Addendum)
Transition of Care (TOC) - Initial/Assessment Note  Heart Failure  Patient Details  Name: Charles Gray MRN: [id number] Date of Birth: 18-Mar-1956  Transition of Care [facility]) CM/SW Contact:    [place], [place] Phone Number: 11/01/2020, 11:02 AM  Clinical Narrative:                 CSW spoke with the patient at bedside per request of Cardiology. Charles Gray reported being hesitant about the Cardiomems device and is unsure if he wants to proceed. CSW provided Charles Gray with outpatient mental health resources for him to consider even a virtual telehealth visit since transportation is a concern. CSW provided active listening, support and encouragement with Charles Gray. CSW provided Charles Gray with an appointment card for the [facility] outpatient clinic and encouraged him to follow up and to attend the appointment and bring his medications and if anything changes to please reach out so that CSW/HV clinic team can provide support.   Charles Gray will need [facility] transportation arranged for him at discharge if his son is unable to pick him up. CSW confirmed with [facility] that they are available for transport over the weekend from 9-3pm and to call no later than 2pm to arrange for transport home at time of d/c over the weekend. [phone]    Barriers to Discharge: Continued Medical Work up   Patient Goals and CMS Choice        Expected Discharge Plan and Services   In-house Referral: Clinical Social Work     Living arrangements for the past 2 months: Apartment                                      Prior Living Arrangements/Services Living arrangements for the past 2 months: Apartment Lives with:: Self Patient language and need for interpreter reviewed:: Yes        Need for Family Participation in Patient Care: No (Comment) Care giver support system in place?: No (comment)   Criminal Activity/Legal Involvement Pertinent to Current Situation/Hospitalization:  No - Comment as needed  Activities of Daily Living Home Assistive Devices/Equipment: Blood pressure cuff, Cane (specify quad or straight), Built-in shower seat ADL Screening (condition at time of admission) Patient's cognitive ability adequate to safely complete daily activities?: Yes Is the patient deaf or have difficulty hearing?: No Does the patient have difficulty seeing, even when wearing glasses/contacts?: No Does the patient have difficulty concentrating, remembering, or making decisions?: No Patient able to express need for assistance with ADLs?: Yes Does the patient have difficulty dressing or bathing?: No Independently performs ADLs?: Yes (appropriate for developmental age) Does the patient have difficulty walking or climbing stairs?: No Weakness of Legs: Both Weakness of Arms/Hands: None  Permission Sought/Granted                  Emotional Assessment Appearance:: Appears stated age Attitude/Demeanor/Rapport: Engaged Affect (typically observed): Sad, Depressed Orientation: : Oriented to Self, Oriented to Place, Oriented to  Time, Oriented to Situation   Psych Involvement: No (comment)  Admission diagnosis:  CHF (congestive heart failure) ([place]) [I50.9] Peripheral edema [R60.9] Fall [W19.XXXA] Syncope, unspecified syncope type [R55] Patient Active Problem List   Diagnosis Date Noted   Syncope 10/28/2020   CHF (congestive heart failure) ([place]) 10/28/2020   Chronic venous stasis dermatitis of both lower extremities 08/27/2020   Cellulitis of both lower extremities 08/14/2020   Microcytic anemia 08/14/2020  GERD without esophagitis 06/10/2020   Atrial fibrillation with rapid ventricular response ([place]) 03/24/2020   Acute on chronic right heart failure ([place]) 01/27/2020   Acute respiratory failure with hypoxia (HCC)    Fatty liver disease, nonalcoholic    Shock ([place])    Personal history of DVT (deep vein thrombosis) 12/26/2019   Chronic anticoagulation 12/26/2019    Palpitations 12/26/2019   AF (paroxysmal atrial fibrillation) ([place]) 12/23/2019   Heart failure, systolic, acute ([place]) 64/40/3474   Acute on chronic congestive heart failure (HCC)    Acute on chronic diastolic CHF (congestive heart failure) ([place]) 08/23/2019   Chest pain 08/23/2019   Lactic acidosis 08/23/2019   History of pulmonary embolism 08/12/2019   Atrial tachycardia ([place]) 08/12/2019   Occasional tremors 08/12/2019   OSA (obstructive sleep apnea) 08/12/2019   Acute on chronic right-sided heart failure ([place]) 08/06/2019   Pulmonary embolism ([place]) 06/08/2019   Normocytic anemia 06/08/2019   Pulmonary emboli ([place]) 06/08/2019   Chronic heart failure with preserved ejection fraction ([place]) 05/30/2019   Class 2 severe obesity due to excess calories with serious comorbidity and body mass index (BMI) of 39.0 to 39.9 in adult ([place]) 05/30/2019   Morbid obesity with BMI of 40.0-44.9, adult ([place]) 05/30/2019   Hyponatremia    Drug induced constipation    Peripheral edema    Hypotension due to drugs    Hypoalbuminemia due to protein-calorie malnutrition (HCC)    Hypokalemia    Thrombocytopenia (HCC)    Anemia of chronic disease    Cirrhosis of liver without ascites ([place])    Chronic pain syndrome    Debility 03/13/2019   Portal hypertensive gastropathy ([place]) 03/06/2019   Dyspnea    GIB (gastrointestinal bleeding) 03/04/2019   Mixed hyperlipidemia 01/24/2019   Insomnia 01/24/2019   Hyperlipidemia 01/24/2019   Essential hypertension 01/24/2019   Lumbar disc herniation 01/24/2019   PCP:  Enid Skeens., MD Pharmacy:   [facility], [place] [address] Phone: [phone] Fax: [place] [address] [address] Phone: [phone] Fax: [place] [address] [place] [address] Phone: [phone] Fax:  [phone]     Social Determinants of Health (SDOH) Interventions Housing Interventions: Intervention Not Indicated  Readmission Risk Interventions Readmission Risk Prevention Plan 09/26/2020 06/12/2020 03/27/2020  Transportation Screening Complete Complete Complete  HRI or [place] for [place] Planning/Counseling - - -  Medication Review Press photographer) Complete Complete Complete  PCP or Specialist appointment within 3-5 days of discharge Complete - Complete  HRI or [place] Complete Complete Complete  SW Recovery Care/Counseling Consult Complete Complete Complete  Palliative Care Screening Not Applicable Not Applicable Not [place] Not Applicable Not Applicable Not Applicable    Addie Alonge, MSW, [place] Heart Failure Social Worker

## 2020-11-01 NOTE — Progress Notes (Signed)
Patient ID: Charles Gray, male   DOB: 1955-10-23, 65 y.o.   MRN: [id number]  RHC Procedural Findings: Hemodynamics (mmHg) RA mean 7 RV 29/9 PA 32/15, mean 22 PCWP mean 13 Oxygen saturations: PA 65% AO 100% Cardiac Output ([place]ick) 7.32  Cardiac Index ([place]ick) 2.41  Volume status is optimized.  I will stop IV Lasix.  Start torsemide 100 mg bid tomorrow with metolazone 2.5 mg qMon/Wed/Fri.    We will start working on insurance authorization for [employer].    He can go home from my standpoint.  Continue his pre-admission cardiac regimen except that we will increase metolazone to qM/W/F.    Followup in CHF clinic 10 days, will need paramedicine.  Cardiology will sign off.   Loralie Champagne 11/01/2020 2:23 PM

## 2020-11-02 DIAGNOSIS — I5033 Acute on chronic diastolic (congestive) heart failure: Secondary | ICD-10-CM | POA: Diagnosis not present

## 2020-11-02 DIAGNOSIS — I480 Paroxysmal atrial fibrillation: Secondary | ICD-10-CM | POA: Diagnosis not present

## 2020-11-02 DIAGNOSIS — I10 Essential (primary) hypertension: Secondary | ICD-10-CM | POA: Diagnosis not present

## 2020-11-02 LAB — CBC WITH DIFFERENTIAL/PLATELET
Abs Immature Granulocytes: 0.02 10*3/uL (ref 0.00–0.07)
Basophils Absolute: 0 10*3/uL (ref 0.0–0.1)
Basophils Relative: 0 %
Eosinophils Absolute: 0.2 10*3/uL (ref 0.0–0.5)
Eosinophils Relative: 3 %
HCT: 35.5 % — ABNORMAL LOW (ref 39.0–52.0)
Hemoglobin: 10.5 g/dL — ABNORMAL LOW (ref 13.0–17.0)
Immature Granulocytes: 0 %
Lymphocytes Relative: 18 %
Lymphs Abs: 1.2 10*3/uL (ref 0.7–4.0)
MCH: 23.5 pg — ABNORMAL LOW (ref 26.0–34.0)
MCHC: 29.6 g/dL — ABNORMAL LOW (ref 30.0–36.0)
MCV: 79.4 fL — ABNORMAL LOW (ref 80.0–100.0)
Monocytes Absolute: 0.6 10*3/uL (ref 0.1–1.0)
Monocytes Relative: 10 %
Neutro Abs: 4.5 10*3/uL (ref 1.7–7.7)
Neutrophils Relative %: 69 %
Platelets: 135 10*3/uL — ABNORMAL LOW (ref 150–400)
RBC: 4.47 MIL/uL (ref 4.22–5.81)
RDW: 17.1 % — ABNORMAL HIGH (ref 11.5–15.5)
WBC: 6.5 10*3/uL (ref 4.0–10.5)
nRBC: 0 % (ref 0.0–0.2)

## 2020-11-02 LAB — GLUCOSE, CAPILLARY: Glucose-Capillary: 113 mg/dL — ABNORMAL HIGH (ref 70–99)

## 2020-11-02 LAB — BASIC METABOLIC PANEL
Anion gap: 8 (ref 5–15)
BUN: 17 mg/dL (ref 8–23)
CO2: 33 mmol/L — ABNORMAL HIGH (ref 22–32)
Calcium: 9.2 mg/dL (ref 8.9–10.3)
Chloride: 94 mmol/L — ABNORMAL LOW (ref 98–111)
Creatinine, Ser: 0.99 mg/dL (ref 0.61–1.24)
GFR, Estimated: >60 mL/min (ref >60)
Glucose, Bld: 124 mg/dL — ABNORMAL HIGH (ref 70–99)
Potassium: 3.5 mmol/L (ref 3.5–5.1)
Sodium: 135 mmol/L (ref 135–145)

## 2020-11-02 MED ORDER — SPIRONOLACTONE 50 MG PO TABS: 50.0000 mg | ORAL_TABLET | Freq: Every day | ORAL | 0 refills | Status: DC

## 2020-11-02 MED ORDER — POLYETHYLENE GLYCOL 3350 17 GM/SCOOP PO POWD: 17.0000 g | Freq: Every day | ORAL | 0 refills | Status: DC

## 2020-11-02 MED ORDER — METOPROLOL SUCCINATE ER 25 MG PO TB24: 25.0000 mg | ORAL_TABLET | Freq: Two times a day (BID) | ORAL | 2 refills | Status: DC

## 2020-11-02 MED ORDER — METOLAZONE 2.5 MG PO TABS: 2.5000 mg | ORAL_TABLET | ORAL | 3 refills | Status: DC

## 2020-11-02 NOTE — TOC Initial Note (Addendum)
Transition of Care [facility]) - Initial/Assessment Note    Patient Details  Name: Charles Gray MRN: [id number] Date of Birth: 07-Apr-1956  Transition of Care [facility]) CM/SW Contact:    Charles Rasher, RN Phone Number: [phone] 11/02/2020, 10:23 AM  Clinical Narrative:                  TOC CM spoke with pt at bedside states his goal is to be able to walk to the pharmacy that is 2 blocks from his home. States he is established with [employer] for his rides to appts. Pt also has Medicaid and can utilized for transportation to appts. He uses his RW at home but states last year he wished he would have gotten the RW with a seat. Charles Gray and pt received RW in 2021 and would not be eligible for another 4 years to receive DME. TOC CM contacted HR CSW to see if pt can have heavy duty Rollator covered from HF fund. Pt may have more mobility with a Rollator. [employer] rep, Charles Gray will email [phone] invoice to CM. Offered choice for [facility] and pt requested [facility]. Will send message to [facility] rep, Charles Gray. States he has an Insurance account manager up through [facility] [employer]. He receives 1.5 hours 4 days per week. Pt is established with HF Paramedicine team. He also was going to [facility]. States he is not able to the dressing changes to his legs. Pt states he needs an government ID. Pt received a check for $75 from [facility] and has not been able to cash because he does not have ID. Pt will bring check with him on Friday and CM will follow up with [employer] on how to cash check. Will follow up with DMV to see how pt may obtain an ID. Pt lives at home alone but wishes his son was more supportive in helping him at home.   Pt will need ride home with [facility] that operates from 9-2 pm.    Expected Discharge Plan: [place] Barriers to Discharge: Continued Medical Work up   Patient Goals and CMS Choice Patient states their goals for this hospitalization and ongoing  recovery are:: wants to get back to walking 2 blocks to his pharmacy to pick up his meds CMS Medicare.gov Compare Post Acute Care list provided to:: Patient Choice offered to / list presented to : Patient  Expected Discharge Plan and Services Expected Discharge Plan: [place] In-house Referral: Clinical Social Work Discharge Planning Services: CM Consult Post Acute Care Choice: [place] arrangements for the past 2 months: Apartment                 DME Arranged: Walker rolling with seat (will see if heavy duty Conservation officer, nature with seat can be funded by HF funds)         HH Arranged: Therapist, sports, PT HH Agency: [place] Date [place]: 11/02/20 Time [place]: 1021 Representative spoke with at [place]  Prior Living Arrangements/Services Living arrangements for the past 2 months: Apartment Lives with:: Self Patient language and need for interpreter reviewed:: Yes Do you feel safe going back to the place where you live?: Yes      Need for Family Participation in Patient Care: No (Comment) Care giver support system in place?: No (comment) Current home services: DME (walker, shower bench, scale, blood pressure cuff, hearing aid, pulse ox)  Criminal Activity/Legal Involvement Pertinent to Current Situation/Hospitalization: No - Comment as needed  Activities of Daily Living Home Assistive Devices/Equipment: Blood pressure cuff, Cane (specify quad or straight), Built-in shower seat ADL Screening (condition at time of admission) Patient's cognitive ability adequate to safely complete daily activities?: Yes Is the patient deaf or have difficulty hearing?: No Does the patient have difficulty seeing, even when wearing glasses/contacts?: No Does the patient have difficulty concentrating, remembering, or making decisions?: No Patient able to express need for assistance with ADLs?: Yes Does the patient have difficulty dressing or  bathing?: No Independently performs ADLs?: Yes (appropriate for developmental age) Does the patient have difficulty walking or climbing stairs?: No Weakness of Legs: Both Weakness of Arms/Hands: None  Permission Sought/Granted Permission sought to share information with : Case Manager, PCP, Family Supports Permission granted to share information with : Yes, Verbal Permission Granted  Share Information with NAME: Charles Gray  Permission granted to share info w AGENCY: [place], DME  Permission granted to share info w Relationship: son  Permission granted to share info w Contact Information: [phone]  Emotional Assessment Appearance:: Appears stated age Attitude/Demeanor/Rapport: Engaged Affect (typically observed): Accepting Orientation: : Oriented to Self, Oriented to Place, Oriented to  Time, Oriented to Situation   Psych Involvement: No (comment)  Admission diagnosis:  CHF (congestive heart failure) ([place]) [I50.9] Peripheral edema [R60.9] Fall [W19.XXXA] Syncope, unspecified syncope type [R55] Patient Active Problem List   Diagnosis Date Noted   Syncope 10/28/2020   CHF (congestive heart failure) ([place]) 10/28/2020   Chronic venous stasis dermatitis of both lower extremities 08/27/2020   Cellulitis of both lower extremities 08/14/2020   Microcytic anemia 08/14/2020   GERD without esophagitis 06/10/2020   Atrial fibrillation with rapid ventricular response ([place]) 03/24/2020   Acute on chronic right heart failure ([place]) 01/27/2020   Acute respiratory failure with hypoxia (HCC)    Fatty liver disease, nonalcoholic    Shock ([place])    Personal history of DVT (deep vein thrombosis) 12/26/2019   Chronic anticoagulation 12/26/2019   Palpitations 12/26/2019   AF (paroxysmal atrial fibrillation) ([place]) 12/23/2019   Heart failure, systolic, acute ([place]) 54/00/8676   Acute on chronic congestive heart failure (HCC)    Acute on chronic diastolic CHF (congestive heart failure)  ([place]) 08/23/2019   Chest pain 08/23/2019   Lactic acidosis 08/23/2019   History of pulmonary embolism 08/12/2019   Atrial tachycardia ([place]) 08/12/2019   Occasional tremors 08/12/2019   OSA (obstructive sleep apnea) 08/12/2019   Acute on chronic right-sided heart failure ([place]) 08/06/2019   Pulmonary embolism ([place]) 06/08/2019   Normocytic anemia 06/08/2019   Pulmonary emboli ([place]) 06/08/2019   Chronic heart failure with preserved ejection fraction ([place]) 05/30/2019   Class 2 severe obesity due to excess calories with serious comorbidity and body mass index (BMI) of 39.0 to 39.9 in adult ([place]) 05/30/2019   Morbid obesity with BMI of 40.0-44.9, adult ([place]) 05/30/2019   Hyponatremia    Drug induced constipation    Peripheral edema    Hypotension due to drugs    Hypoalbuminemia due to protein-calorie malnutrition (HCC)    Hypokalemia    Thrombocytopenia (HCC)    Anemia of chronic disease    Cirrhosis of liver without ascites ([place])    Chronic pain syndrome    Debility 03/13/2019   Portal hypertensive gastropathy ([place]) 03/06/2019   Dyspnea    GIB (gastrointestinal bleeding) 03/04/2019   Mixed hyperlipidemia 01/24/2019   Insomnia 01/24/2019   Hyperlipidemia 01/24/2019  Essential hypertension 01/24/2019   Lumbar disc herniation 01/24/2019   PCP:  Enid Skeens., MD Pharmacy:   [facility], [place] [address] Phone: [phone] Fax: [place] [address] [place] [address] Phone: [phone] Fax: [name] [address] [place] [address] Phone: [phone] Fax: [phone]     Social Determinants of Health (SDOH) Interventions Housing Interventions: Intervention Not Indicated  Readmission Risk Interventions Readmission Risk Prevention Plan 09/26/2020 06/12/2020 03/27/2020  Transportation Screening Complete  Complete Complete  HRI or [place] for [place] Planning/Counseling - - -  Medication Review Press photographer) Complete Complete Complete  PCP or Specialist appointment within 3-5 days of discharge Complete - Complete  HRI or [place] Complete Complete Complete  SW Recovery Care/Counseling Consult Complete Complete Complete  Palliative Care Screening Not Applicable Not Applicable Not [place] Not Applicable Not Applicable Not Applicable

## 2020-11-02 NOTE — Discharge Summary (Signed)
Discharge Summary  Charles Gray LGX:[id number] DOB: Apr 12, 1956  PCP: Enid Skeens., MD  Admit date: 10/28/2020 Discharge date: 11/02/2020  Time spent: 40 mins  Recommendations for Outpatient Follow-up:  Follow-up with cardiology as scheduled Follow-up with PCP  Discharge Diagnoses:  Active Hospital Problems   Diagnosis Date Noted   CHF (congestive heart failure) ([place]) 10/28/2020   Syncope 10/28/2020   AF (paroxysmal atrial fibrillation) ([place]) 12/23/2019   OSA (obstructive sleep apnea) 08/12/2019   Pulmonary embolism ([place]) 06/08/2019   Morbid obesity with BMI of 40.0-44.9, adult ([place]) 05/30/2019   Essential hypertension 01/24/2019    Resolved Hospital Problems  No resolved problems to display.    Discharge Condition: Stable  Diet recommendation: Heart healthy  Vitals:   11/02/20 0537 11/02/20 1252  BP: 125/81 126/81  Pulse: (!) 50 72  Resp:  18  Temp: 97.9 F (36.6 C) 98.3 F (36.8 C)  SpO2: 100% 97%    History of present illness:  65 year old male past medical history for chronic diastolic heart failure, hypertension, paroxysmal atrial fibrillation and obesity class III who presented after a syncope episode. He was recently diagnosed with CHF exacerbation and his torsemide was increased from 80 mg twice daily to 100 mg twice daily along with metolazone twice weekly.  Despite adjustments in his diuretic therapy he continued to gain weight and developed worsening lower extremity edema.  Feeling generalized weakness and lightheadedness, on the day of hospitalization while going to the bathroom he stood up and developed an orthostatic syncope. CT head and cervical spine negative for acute changes. Chest radiograph with cardiomegaly, hilar vascular congestion, left peribronchial cuffing.  Patient admitted for further management.    Today, patient denies any new complaints, denies any chest pain, shortness of breath, abdominal pain, nausea/vomiting,  fever/chills.    Hospital Course:  Principal Problem:   CHF (congestive heart failure) (H[facility]) Active Problems:   Pulmonary embolism ([facility])   OSA (obstructive sleep apnea)   AF (paroxysmal atrial fibrillation) ([place])   Essential hypertension   Morbid obesity with BMI of 40.0-44.9, adult ([facility])   Syncope   Acute on chronic diastolic heart failure decompensation Multiple CHF admissions despite aggressive outpatient diuretic regimen Echo with EF of 60 to 65%, IVC not visualized LHC done 1/22 with no significant CAD Cardiology/HF on board, RHC done on 7/22 showed volume status is currently optimized.  Recommend Cardiomems placement (patient currently refusing, wants more info) Continue torsemide 100 mg twice daily and metolazone 2.5 mg every M/W/F Continue spironolactone 50 mg daily Outpatient follow-up with cardiology/HF   Hx of PE/DVT VQ scan in 6/21 did not show any evidence of chronic PE Continue apixaban   HTN  Continue Toprol   Paroxysmal atrial fibrillation   Continue rate control with metoprolol XL 25 mg po bid Anticoagulation with apixaban   Chronic pain syndrome Continue pain control with soboxone   Possible syncope with fall Reported he passed out in the morning PTA, unknown duration after fall Management as above Home health PT/OT   Acute right foot non displaced intra-articular fracture at the base of the first proximal phalanx. Continue pain control  Home health PT/OT Spoke to orthopedics Hilbert Odor on 11/01/2020, no further recommendation   OSA Unable to tolerate CPAP   Obesity Lifestyle modification advised   Anxiety/depression Continue home regimen     Estimated body mass index is 17.68 kg/m as calculated from the following:   Height as of this encounter: 6' 10"  (2.083 m).   Weight as of  this encounter: 76.7 kg.    Procedures: Right heart cath  Consultations: Cardiology  Discharge Exam: BP 126/81 (BP Location: Left Arm)   Pulse 72    Temp 98.3 F (36.8 C) (Oral)   Resp 18   Ht 6' 10"  (2.083 m)   Wt 76.7 kg   SpO2 97%   BMI 17.68 kg/m   General: NAD, deconditioned Cardiovascular: S1, S2 present Respiratory: Diminished breath sounds bilaterally Abdomen: Soft, nontender, nondistended, bowel sounds present Musculoskeletal: 2+ bilateral pedal edema noted Skin: Normal Psychiatry: Fair mood   Discharge Instructions You were cared for by a hospitalist during your hospital stay. If you have any questions about your discharge medications or the care you received while you were in the hospital after you are discharged, you can call the unit and asked to speak with the hospitalist on call if the hospitalist that took care of you is not available. Once you are discharged, your primary care physician will handle any further medical issues. Please note that NO REFILLS for any discharge medications will be authorized once you are discharged, as it is imperative that you return to your primary care physician (or establish a relationship with a primary care physician if you do not have one) for your aftercare needs so that they can reassess your need for medications and monitor your lab values.  Discharge Instructions     Diet - low sodium heart healthy   Complete by: As directed    Discharge wound care:   Complete by: As directed    As recommended by wound clinic   Increase activity slowly   Complete by: As directed       Allergies as of 11/02/2020       Reactions   Penicillins    Broke out in convulsions as a child  Did it involve swelling of the face/tongue/throat, SOB, or low BP? N/A Did it involve sudden or severe rash/hives, skin peeling, or any reaction on the inside of your mouth or nose? N/A Did you need to seek medical attention at a hospital or doctor's office?N/A When did it last happen? N/A If all above answers are "NO", may proceed with cephalosporin use.        Medication List     TAKE these  medications    acetaminophen 500 MG tablet Commonly known as: TYLENOL Take 1,000 mg by mouth every 6 (six) hours as needed for moderate pain or headache.   apixaban 5 MG Tabs tablet Commonly known as: Eliquis Take 1 tablet (5 mg total) by mouth 2 (two) times daily.   Buprenorphine HCl-Naloxone HCl 8-2 MG Film Place 1 tablet under the tongue in the morning and at bedtime.   eplerenone 50 MG tablet Commonly known as: INSPRA Take 2 tablets (100 mg total) by mouth daily.   gabapentin 800 MG tablet Commonly known as: NEURONTIN Take 1 tablet (800 mg total) by mouth 3 (three) times daily.   metolazone 2.5 MG tablet Commonly known as: ZAROXOLYN Take 1 tablet (2.5 mg total) by mouth 3 (three) times a week. Monday, Wednesday and Fridays Start taking on: November 04, 2020 What changed:  when to take this additional instructions   metoprolol succinate 25 MG 24 hr tablet Commonly known as: Toprol XL Take 1 tablet (25 mg total) by mouth 2 (two) times daily.   ondansetron 4 MG disintegrating tablet Commonly known as: ZOFRAN-ODT TAKE 1 TABLET BY MOUTH EVERY 8 HOURS AS NEEDED FOR NAUSEA OR VOMITING   pantoprazole  40 MG tablet Commonly known as: PROTONIX Take 40 mg by mouth 2 (two) times daily.   polyethylene glycol powder 17 GM/SCOOP powder Commonly known as: GLYCOLAX/MIRALAX Take 17 g by mouth daily. What changed:  when to take this reasons to take this   potassium chloride SA 20 MEQ tablet Commonly known as: KLOR-CON Take 4 tablets (80 mEq total) by mouth 2 (two) times daily.   sodium chloride 0.65 % Soln nasal spray Commonly known as: OCEAN Place 1 spray into both nostrils as needed for congestion.   spironolactone 50 MG tablet Commonly known as: ALDACTONE Take 1 tablet (50 mg total) by mouth daily. Start taking on: November 03, 2020   tamsulosin 0.4 MG Caps capsule Commonly known as: FLOMAX Take 1 capsule (0.4 mg total) by mouth daily.   torsemide 100 MG tablet Commonly  known as: DEMADEX Take 1 tablet (100 mg total) by mouth 2 (two) times daily.   Ventolin HFA 108 (90 Base) MCG/ACT inhaler Generic drug: albuterol Inhale 1 puff into the lungs every 4 (four) hours as needed for shortness of breath.   zolpidem 10 MG tablet Commonly known as: AMBIEN Take 10 mg by mouth at bedtime.       ASK your doctor about these medications    pravastatin 20 MG tablet Commonly known as: PRAVACHOL Take 1 tablet (20 mg total) by mouth daily.   sertraline 25 MG tablet Commonly known as: ZOLOFT Take 1 tablet (25 mg total) by mouth daily.               Durable Medical Equipment  (From admission, onward)           Start     Ordered   11/01/20 1635  For home use only DME 4 wheeled rolling walker with seat  Once       Comments: 6'10" 167 kg  Question:  Patient needs a walker to treat with the following condition  Answer:  CHF (congestive heart failure) ([place])   11/01/20 1638              Discharge Care Instructions  (From admission, onward)           Start     Ordered   11/02/20 0000  Discharge wound care:       Comments: As recommended by wound clinic   11/02/20 1128           Allergies  Allergen Reactions   Penicillins     Broke out in convulsions as a child  Did it involve swelling of the face/tongue/throat, SOB, or low BP? N/A Did it involve sudden or severe rash/hives, skin peeling, or any reaction on the inside of your mouth or nose? N/A Did you need to seek medical attention at a hospital or doctor's office?N/A When did it last happen? N/A If all above answers are "NO", may proceed with cephalosporin use.    Follow-up Information     Leandrew Koyanagi, MD. Call in 1 day.   Specialty: Orthopedic Surgery Why: discuss your visit, see when they want to see you in the office Contact information: [place] [id number] [phone]         [facility] Follow  up on 11/08/2020.   Specialty: Cardiology Why: Heart Failure Clinic at 12 pm [place] code [place] information: [address] [id number] [place] [phone]  [place], Marshall Cork., MD. Schedule an appointment as soon as possible for a visit in 1 week(s).   Specialty: Family Medicine Contact information: [address] [place] [address] [phone]         Berniece Salines, DO .   Specialty: Cardiology Contact information: [place] [address] [phone]                  The results of significant diagnostics from this hospitalization (including imaging, microbiology, ancillary and laboratory) are listed below for reference.    Significant Diagnostic Studies: CT Angio Head W or Wo Contrast  Result Date: 10/12/2020 CLINICAL DATA:  65 year old male with shortness of breath and headache. Stroke/TIA. EXAM: CT ANGIOGRAPHY HEAD AND NECK TECHNIQUE: Multidetector CT imaging of the head and neck was performed using the standard protocol during bolus administration of intravenous contrast. Multiplanar CT image reconstructions and MIPs were obtained to evaluate the vascular anatomy. Carotid stenosis measurements (when applicable) are obtained utilizing NASCET criteria, using the distal internal carotid diameter as the denominator. CONTRAST:  20m OMNIPAQUE IOHEXOL 350 MG/ML SOLN COMPARISON:  Head CT without contrast 03/07/2019 FINDINGS: CT HEAD Brain: No midline shift, ventriculomegaly, mass effect, evidence of mass lesion, intracranial hemorrhage or evidence of cortically based acute infarction. Gray-white matter differentiation is within normal limits throughout the brain. No encephalomalacia identified. Calvarium and skull base: No acute osseous abnormality identified. Paranasal sinuses: Visualized paranasal sinuses and mastoids are clear. Orbits: Mild leftward gaze. Otherwise negative orbit and scalp soft  tissues. CTA NECK Skeleton: Largely absent dentition. Carious residual teeth. Chronic cervical spine disc and endplate degeneration. No acute osseous abnormality identified. Upper chest: Negative visible lung apices and superior mediastinum. Other neck: Thyroid is negative for age. Negative other neck soft tissues. Aortic arch: Aortic arch not completely included. Three vessel arch configuration is evident. Right carotid system: Visible brachiocephalic artery and right CCA origin are normal. Patent right carotid bifurcation with minimal calcified plaque at the medial bulb. No cervical right ICA stenosis. Left carotid system: Visible left CCA is normal. Patent left carotid bifurcation. Trace calcified plaque at the posterior left ICA origin. Partially retropharyngeal course of the cervical left ICA with no stenosis to the skull base. Vertebral arteries: Normal proximal right subclavian artery and right vertebral artery origin. Dominant right vertebral artery is patent to the skull base with only minor V3 segment calcified plaque and no stenosis. Visible proximal left subclavian artery is normal. Diminutive left vertebral artery origin is patent. The left vertebral remains non dominant and diminutive to the skull base where it functionally terminates in PICA at the crossing of the dura. CTA HEAD Posterior circulation: Dominant right vertebral artery supplies the basilar with mild tortuosity and normal right PICA origin. Left vertebral functionally terminates in PICA. Tortuous basilar artery is patent without stenosis. SCA and PCA origins are patent with mild ectasia of the basilar tip but no discrete aneurysm. Posterior communicating arteries are diminutive or absent. Left PCA branches are within normal limits. There is mild to moderate right PCA P2 segment stenosis with preserved distal enhancement on series 13, image 18. Anterior circulation: Both ICA siphons are patent. Minimal if any siphon calcified plaque  without stenosis. Normal ophthalmic artery origins. Patent carotid termini. Patent MCA and ACA origins. Non dominant right ACA A1 segment with superimposed mild to moderate stenosis on series 12, image 22. Normal anterior communicating artery. Bilateral ACA branches are within normal limits. Left MCA M1 segment is mildly tortuous and irregular  with no definite stenosis. Left MCA bifurcation is patent. There is a moderate to severe stenosis of a posterior left MCA M3 branch on series 13, image 28. right MCA M1 segment and bifurcation are patent without stenosis. Right MCA branches are mildly irregular with no discrete branch occlusion or high-grade stenosis identified. Venous sinuses: Patent. Anatomic variants: Dominant right vertebral artery, the left is diminutive and terminates in PICA. Non dominant right ACA A1. Review of the MIP images confirms the above findings IMPRESSION: 1. Negative for large vessel occlusion. With stable and normal for age non contrast CT appearance of the brain. 2. Minimal extracranial atherosclerosis, but intracranial atherosclerosis affecting mostly 2nd and 3rd order branches, notable for: - Severe stenosis Left MCA M3 branch. - Moderate stenosis Right ACA A1 segment. - Moderate stenosis Right PCA P2 segment. 3. Chronic cervical spine degeneration. Electronically Signed   By: Genevie Ann M.D.   On: 10/12/2020 09:02   X-ray chest PA and lateral  Result Date: 10/29/2020 CLINICAL DATA:  CHF. EXAM: CHEST - 2 VIEW COMPARISON:  10/28/2020.  09/25/2019.  CT 10/12/2019. FINDINGS: Prominent pericardial fat pads again noted. Stable cardiomegaly. No pulmonary venous congestion. Stable chronic interstitial changes. No focal infiltrate or edema. No pleural effusion or pneumothorax. Degenerative change thoracic spine. IMPRESSION: Stable cardiomegaly. Stable chronic interstitial changes. No focal infiltrate or edema. Electronically Signed   By: Marcello Moores  Register   On: 10/29/2020 05:55   DG Shoulder  Right  Result Date: 10/28/2020 CLINICAL DATA:  Syncopal episode fall EXAM: RIGHT SHOULDER - 2+ VIEW COMPARISON:  None. FINDINGS: Moderate AC joint degenerative change. No fracture or malalignment. Slightly high-riding appearance of the humeral head as may be seen with rotator cuff disease. IMPRESSION: No acute osseous abnormality. Electronically Signed   By: Donavan Foil M.D.   On: 10/28/2020 16:54   CT Head Wo Contrast  Result Date: 10/28/2020 CLINICAL DATA:  Provided history: Head trauma, coagulopathy. Level 2 fall on blood thinners. Neck trauma, dangerous injury mechanism. EXAM: CT HEAD WITHOUT CONTRAST CT CERVICAL SPINE WITHOUT CONTRAST TECHNIQUE: Multidetector CT imaging of the head and cervical spine was performed following the standard protocol without intravenous contrast. Multiplanar CT image reconstructions of the cervical spine were also generated. COMPARISON:  CT angiogram head/neck 10/12/2020. FINDINGS: CT HEAD FINDINGS Brain: Mild generalized parenchymal atrophy. There is no acute intracranial hemorrhage. No demarcated cortical infarct. No extra-axial fluid collection. No evidence of an intracranial mass. No midline shift. Partially empty sella turcica. Vascular: No hyperdense vessel. Skull: Normal. Negative for fracture or focal lesion. Sinuses/Orbits: Visualized orbits show no acute finding. Trace bilateral ethmoid sinus mucosal thickening. Mild polypoid mucosal thickening within the inferior maxillary sinuses bilaterally. CT CERVICAL SPINE FINDINGS Alignment: Straightening of the expected cervical lordosis. No significant spondylolisthesis. Skull base and vertebrae: The basion-dental and atlanto-dental intervals are maintained.No evidence of acute fracture to the cervical spine. Soft tissues and spinal canal: No prevertebral fluid or swelling. No visible canal hematoma. Disc levels: Cervical spondylosis with multilevel disc space narrowing, disc bulges, posterior disc osteophytes, endplate  spurring, uncovertebral hypertrophy and facet arthrosis. Disc space narrowing is greatest at C4-C5, C5-C6, C6-C7 and C7-T1 (moderate/severe). Multilevel spinal canal stenosis. Most notably, there is no least moderate spinal canal stenosis at C4-C5 and C5-C6 and severe spinal canal stenosis at C6-C7. Multilevel bony neural foraminal narrowing. Multilevel ventral osteophytes. Upper chest: No consolidation within the imaged lung apices. No visible pneumothorax. IMPRESSION: CT head: 1. No evidence of acute intracranial abnormality. 2. Mild generalized parenchymal atrophy.  3. Paranasal sinus disease, as described. CT cervical spine: 1. No evidence of acute fracture to the cervical spine. 2. Straightening of the expected cervical lordosis. 3. Cervical spondylosis, as outlined. Notably, there is suspected at least moderate spinal canal stenosis at C4-C5 and C5-C6, and apparent severe spinal canal stenosis at C6-C7. Electronically Signed   By: Kellie Simmering DO   On: 10/28/2020 15:19   CT Angio Neck W and/or Wo Contrast  Result Date: 10/12/2020 CLINICAL DATA:  65 year old male with shortness of breath and headache. Stroke/TIA. EXAM: CT ANGIOGRAPHY HEAD AND NECK TECHNIQUE: Multidetector CT imaging of the head and neck was performed using the standard protocol during bolus administration of intravenous contrast. Multiplanar CT image reconstructions and MIPs were obtained to evaluate the vascular anatomy. Carotid stenosis measurements (when applicable) are obtained utilizing NASCET criteria, using the distal internal carotid diameter as the denominator. CONTRAST:  66m OMNIPAQUE IOHEXOL 350 MG/ML SOLN COMPARISON:  Head CT without contrast 03/07/2019 FINDINGS: CT HEAD Brain: No midline shift, ventriculomegaly, mass effect, evidence of mass lesion, intracranial hemorrhage or evidence of cortically based acute infarction. Gray-white matter differentiation is within normal limits throughout the brain. No encephalomalacia  identified. Calvarium and skull base: No acute osseous abnormality identified. Paranasal sinuses: Visualized paranasal sinuses and mastoids are clear. Orbits: Mild leftward gaze. Otherwise negative orbit and scalp soft tissues. CTA NECK Skeleton: Largely absent dentition. Carious residual teeth. Chronic cervical spine disc and endplate degeneration. No acute osseous abnormality identified. Upper chest: Negative visible lung apices and superior mediastinum. Other neck: Thyroid is negative for age. Negative other neck soft tissues. Aortic arch: Aortic arch not completely included. Three vessel arch configuration is evident. Right carotid system: Visible brachiocephalic artery and right CCA origin are normal. Patent right carotid bifurcation with minimal calcified plaque at the medial bulb. No cervical right ICA stenosis. Left carotid system: Visible left CCA is normal. Patent left carotid bifurcation. Trace calcified plaque at the posterior left ICA origin. Partially retropharyngeal course of the cervical left ICA with no stenosis to the skull base. Vertebral arteries: Normal proximal right subclavian artery and right vertebral artery origin. Dominant right vertebral artery is patent to the skull base with only minor V3 segment calcified plaque and no stenosis. Visible proximal left subclavian artery is normal. Diminutive left vertebral artery origin is patent. The left vertebral remains non dominant and diminutive to the skull base where it functionally terminates in PICA at the crossing of the dura. CTA HEAD Posterior circulation: Dominant right vertebral artery supplies the basilar with mild tortuosity and normal right PICA origin. Left vertebral functionally terminates in PICA. Tortuous basilar artery is patent without stenosis. SCA and PCA origins are patent with mild ectasia of the basilar tip but no discrete aneurysm. Posterior communicating arteries are diminutive or absent. Left PCA branches are within normal  limits. There is mild to moderate right PCA P2 segment stenosis with preserved distal enhancement on series 13, image 18. Anterior circulation: Both ICA siphons are patent. Minimal if any siphon calcified plaque without stenosis. Normal ophthalmic artery origins. Patent carotid termini. Patent MCA and ACA origins. Non dominant right ACA A1 segment with superimposed mild to moderate stenosis on series 12, image 22. Normal anterior communicating artery. Bilateral ACA branches are within normal limits. Left MCA M1 segment is mildly tortuous and irregular with no definite stenosis. Left MCA bifurcation is patent. There is a moderate to severe stenosis of a posterior left MCA M3 branch on series 13, image 28. right MCA M1  segment and bifurcation are patent without stenosis. Right MCA branches are mildly irregular with no discrete branch occlusion or high-grade stenosis identified. Venous sinuses: Patent. Anatomic variants: Dominant right vertebral artery, the left is diminutive and terminates in PICA. Non dominant right ACA A1. Review of the MIP images confirms the above findings IMPRESSION: 1. Negative for large vessel occlusion. With stable and normal for age non contrast CT appearance of the brain. 2. Minimal extracranial atherosclerosis, but intracranial atherosclerosis affecting mostly 2nd and 3rd order branches, notable for: - Severe stenosis Left MCA M3 branch. - Moderate stenosis Right ACA A1 segment. - Moderate stenosis Right PCA P2 segment. 3. Chronic cervical spine degeneration. Electronically Signed   By: Genevie Ann M.D.   On: 10/12/2020 09:02   CT Cervical Spine Wo Contrast  Result Date: 10/28/2020 CLINICAL DATA:  Provided history: Head trauma, coagulopathy. Level 2 fall on blood thinners. Neck trauma, dangerous injury mechanism. EXAM: CT HEAD WITHOUT CONTRAST CT CERVICAL SPINE WITHOUT CONTRAST TECHNIQUE: Multidetector CT imaging of the head and cervical spine was performed following the standard protocol  without intravenous contrast. Multiplanar CT image reconstructions of the cervical spine were also generated. COMPARISON:  CT angiogram head/neck 10/12/2020. FINDINGS: CT HEAD FINDINGS Brain: Mild generalized parenchymal atrophy. There is no acute intracranial hemorrhage. No demarcated cortical infarct. No extra-axial fluid collection. No evidence of an intracranial mass. No midline shift. Partially empty sella turcica. Vascular: No hyperdense vessel. Skull: Normal. Negative for fracture or focal lesion. Sinuses/Orbits: Visualized orbits show no acute finding. Trace bilateral ethmoid sinus mucosal thickening. Mild polypoid mucosal thickening within the inferior maxillary sinuses bilaterally. CT CERVICAL SPINE FINDINGS Alignment: Straightening of the expected cervical lordosis. No significant spondylolisthesis. Skull base and vertebrae: The basion-dental and atlanto-dental intervals are maintained.No evidence of acute fracture to the cervical spine. Soft tissues and spinal canal: No prevertebral fluid or swelling. No visible canal hematoma. Disc levels: Cervical spondylosis with multilevel disc space narrowing, disc bulges, posterior disc osteophytes, endplate spurring, uncovertebral hypertrophy and facet arthrosis. Disc space narrowing is greatest at C4-C5, C5-C6, C6-C7 and C7-T1 (moderate/severe). Multilevel spinal canal stenosis. Most notably, there is no least moderate spinal canal stenosis at C4-C5 and C5-C6 and severe spinal canal stenosis at C6-C7. Multilevel bony neural foraminal narrowing. Multilevel ventral osteophytes. Upper chest: No consolidation within the imaged lung apices. No visible pneumothorax. IMPRESSION: CT head: 1. No evidence of acute intracranial abnormality. 2. Mild generalized parenchymal atrophy. 3. Paranasal sinus disease, as described. CT cervical spine: 1. No evidence of acute fracture to the cervical spine. 2. Straightening of the expected cervical lordosis. 3. Cervical spondylosis, as  outlined. Notably, there is suspected at least moderate spinal canal stenosis at C4-C5 and C5-C6, and apparent severe spinal canal stenosis at C6-C7. Electronically Signed   By: Kellie Simmering DO   On: 10/28/2020 15:19   CARDIAC CATHETERIZATION  Result Date: 11/01/2020 Filling pressures are optimized.   DG Chest Port 1 View  Result Date: 10/28/2020 CLINICAL DATA:  Recent syncopal episode with chest pain, initial encounter EXAM: PORTABLE CHEST 1 VIEW COMPARISON:  10/12/2020 FINDINGS: Cardiac shadow is stable. Lungs are well aerated bilaterally. No focal infiltrate or effusion is seen. No acute bony abnormality is noted. IMPRESSION: No active disease. Electronically Signed   By: Inez Catalina M.D.   On: 10/28/2020 14:34   DG Chest Port 1 View  Result Date: 10/12/2020 CLINICAL DATA:  65 year old male with shortness of breath. Headache. EXAM: PORTABLE CHEST 1 VIEW COMPARISON:  Or to below chest  09/20/2020 and earlier. FINDINGS: Portable AP semi upright views at 0550 hours. Lung volumes and mediastinal contours remain within normal limits. Visualized tracheal air column is within normal limits. Allowing for portable technique the lungs are clear. No pneumothorax or pleural effusion. Paucity of bowel gas in the upper abdomen. Stable visualized osseous structures. IMPRESSION: No acute cardiopulmonary abnormality. Electronically Signed   By: Genevie Ann M.D.   On: 10/12/2020 06:39   DG Foot Complete Right  Result Date: 10/28/2020 CLINICAL DATA:  Syncope, fall EXAM: RIGHT FOOT COMPLETE - 3+ VIEW COMPARISON:  None. FINDINGS: Bones appear osteopenic. Diffuse soft tissue swelling. Acute intra-articular fracture at the base of the first proximal phalanx. No subluxation IMPRESSION: Acute nondisplaced intra-articular fracture at the base of the first proximal phalanx Electronically Signed   By: Donavan Foil M.D.   On: 10/28/2020 16:56   ECHOCARDIOGRAM COMPLETE  Result Date: 10/29/2020    ECHOCARDIOGRAM REPORT    Patient Name:   Charles Gray Date of Exam: 10/29/2020 Medical Rec #:  [id number]       Height:       82.0 in Accession #:    [id number]      Weight:       390.0 lb Date of Birth:  [birth year]       BSA:          3.110 m Patient Age:    65 years        BP:           130/86 mmHg Patient Gender: M               HR:           65 bpm. Exam Location:  Inpatient Procedure: 2D Echo, Cardiac Doppler, Color Doppler and Intracardiac            Opacification Agent Indications:    CHF  History:        Patient has prior history of Echocardiogram examinations, most                 recent 03/25/2020. CHF, Arrythmias:Atrial Fibrillation,                 Signs/Symptoms:Dyspnea and Chest Pain; Risk Factors:Hypertension                 and Dyslipidemia.  Sonographer:    Luisa Hart RDCS Referring Phys: [id number] Lequita Gray  Sonographer Comments: Patient is morbidly obese and Technically difficult study due to poor echo windows. Image acquisition challenging due to patient body habitus. 05/10/20 cath 08/09/19 cath IMPRESSIONS  1. Left ventricular ejection fraction, by estimation, is 60 to 65%. The left ventricle has normal function. The left ventricle has no regional wall motion abnormalities. There is mild concentric left ventricular hypertrophy. Left ventricular diastolic parameters were normal.  2. Right ventricular systolic function is normal. The right ventricular size is normal. Tricuspid regurgitation signal is inadequate for assessing PA pressure.  3. Left atrial size was mildly dilated.  4. The mitral valve is normal in structure. No evidence of mitral valve regurgitation. No evidence of mitral stenosis.  5. The aortic valve is tricuspid. Aortic valve regurgitation is not visualized. No aortic stenosis is present.  6. There is borderline dilatation of the aortic root, measuring 38 mm. Comparison(s): No significant change from prior study. Prior images reviewed side by side. FINDINGS  Left Ventricle: Left ventricular ejection  fraction, by estimation, is 60 to 65%. The left ventricle has normal function.  The left ventricle has no regional wall motion abnormalities. The left ventricular internal cavity size was normal in size. There is  mild concentric left ventricular hypertrophy. Left ventricular diastolic parameters were normal. Normal left ventricular filling pressure. Right Ventricle: The right ventricular size is normal. No increase in right ventricular wall thickness. Right ventricular systolic function is normal. Tricuspid regurgitation signal is inadequate for assessing PA pressure. Left Atrium: Left atrial size was mildly dilated. Right Atrium: Right atrial size was normal in size. Pericardium: There is no evidence of pericardial effusion. Mitral Valve: The mitral valve is normal in structure. No evidence of mitral valve regurgitation. No evidence of mitral valve stenosis. Tricuspid Valve: The tricuspid valve is normal in structure. Tricuspid valve regurgitation is not demonstrated. No evidence of tricuspid stenosis. Aortic Valve: The aortic valve is tricuspid. Aortic valve regurgitation is not visualized. No aortic stenosis is present. Aortic valve mean gradient measures 4.0 mmHg. Aortic valve peak gradient measures 7.1 mmHg. Aortic valve area, by VTI measures 2.92 cm. Pulmonic Valve: The pulmonic valve was not well visualized. Pulmonic valve regurgitation is not visualized. No evidence of pulmonic stenosis. Aorta: The aortic root is normal in size and structure. There is borderline dilatation of the aortic root, measuring 38 mm. Venous: The inferior vena cava was not well visualized. IAS/Shunts: No atrial level shunt detected by color flow Doppler.  LEFT VENTRICLE PLAX 2D LVIDd:         5.67 cm  Diastology LVIDs:         3.65 cm  LV e' medial:    10.10 cm/s LV PW:         1.20 cm  LV E/e' medial:  7.7 LV IVS:        1.20 cm  LV e' lateral:   11.70 cm/s LVOT diam:     2.40 cm  LV E/e' lateral: 6.6 LV SV:         83 LV SV Index:    27 LVOT Area:     4.52 cm  LEFT ATRIUM           Index       RIGHT ATRIUM           Index LA diam:      4.00 cm 1.29 cm/m  RA Area:     16.90 cm LA Vol (A4C): 44.4 ml 14.28 ml/m RA Volume:   44.30 ml  14.25 ml/m  AORTIC VALVE                   PULMONIC VALVE AV Area (Vmax):    2.71 cm    PV Vmax:       0.86 m/s AV Area (Vmean):   2.82 cm    PV Vmean:      63.500 cm/s AV Area (VTI):     2.92 cm    PV VTI:        0.208 m AV Vmax:           133.00 cm/s PV Peak grad:  3.0 mmHg AV Vmean:          94.000 cm/s PV Mean grad:  2.0 mmHg AV VTI:            0.284 m AV Peak Grad:      7.1 mmHg AV Mean Grad:      4.0 mmHg LVOT Vmax:         79.80 cm/s LVOT Vmean:        58.600 cm/s  LVOT VTI:          0.183 m LVOT/AV VTI ratio: 0.64  AORTA Ao Root diam: 3.80 cm Ao Asc diam:  3.50 cm MITRAL VALVE MV Area (PHT): 3.48 cm    SHUNTS MV Decel Time: 218 msec    Systemic VTI:  0.18 m MV E velocity: 77.60 cm/s  Systemic Diam: 2.40 cm MV A velocity: 82.50 cm/s MV E/A ratio:  0.94 Mihai Croitoru MD Electronically signed by Sanda Klein MD Signature Date/Time: 10/29/2020/4:21:21 PM    Final     Microbiology: Recent Results (from the past 240 hour(s))  Resp Panel by RT-PCR (Flu A&B, Covid) Nasopharyngeal Swab     Status: None   Collection Time: 10/28/20  5:17 PM   Specimen: Nasopharyngeal Swab; Nasopharyngeal(NP) swabs in vial transport medium  Result Value Ref Range Status   SARS Coronavirus 2 by RT PCR NEGATIVE NEGATIVE Final    Comment: (NOTE) SARS-CoV-2 target nucleic acids are NOT DETECTED.  The SARS-CoV-2 RNA is generally detectable in upper respiratory specimens during the acute phase of infection. The lowest concentration of SARS-CoV-2 viral copies this assay can detect is 138 copies/mL. A negative result does not preclude SARS-Cov-2 infection and should not be used as the sole basis for treatment or other patient management decisions. A negative result may occur with  improper specimen  collection/handling, submission of specimen other than nasopharyngeal swab, presence of viral mutation(s) within the areas targeted by this assay, and inadequate number of viral copies(<138 copies/mL). A negative result must be combined with clinical observations, patient history, and epidemiological information. The expected result is Negative.  Fact Sheet for Patients:  EntrepreneurPulse.com.au  Fact Sheet for Healthcare Providers:  IncredibleEmployment.be  This test is no t yet approved or cleared by the [place] FDA and  has been authorized for detection and/or diagnosis of SARS-CoV-2 by FDA under an Emergency Use Authorization (EUA). This EUA will remain  in effect (meaning this test can be used) for the duration of the COVID-19 declaration under Section 564(b)(1) of the Act, 21 U.S.C.section 360bbb-3(b)(1), unless the authorization is terminated  or revoked sooner.       Influenza A by PCR NEGATIVE NEGATIVE Final   Influenza B by PCR NEGATIVE NEGATIVE Final    Comment: (NOTE) The Xpert Xpress SARS-CoV-2/FLU/RSV plus assay is intended as an aid in the diagnosis of influenza from Nasopharyngeal swab specimens and should not be used as a sole basis for treatment. Nasal washings and aspirates are unacceptable for Xpert Xpress SARS-CoV-2/FLU/RSV testing.  Fact Sheet for Patients: EntrepreneurPulse.com.au  Fact Sheet for Healthcare Providers: IncredibleEmployment.be  This test is not yet approved or cleared by the [place] FDA and has been authorized for detection and/or diagnosis of SARS-CoV-2 by FDA under an Emergency Use Authorization (EUA). This EUA will remain in effect (meaning this test can be used) for the duration of the COVID-19 declaration under Section 564(b)(1) of the Act, 21 U.S.C. section 360bbb-3(b)(1), unless the authorization is terminated or revoked.  Performed at [facility], [place] [address], [place], [place] [address]      Labs: Basic Metabolic Panel: Recent Labs  Lab 10/29/20 0352 10/30/20 0246 10/31/20 0352 11/01/20 0253 11/01/20 1412 11/01/20 1413 11/02/20 0253  NA 137 135 134* 134* 138 138 135  K 3.9 3.8 3.6 3.4* 3.7 3.5 3.5  CL 98 94* 88* 91*  --   --  94*  CO2 29 32 34* 32  --   --  33*  GLUCOSE 104* 115* 125* 121*  --   --  124*  BUN 10 12 17 16   --   --  17  CREATININE 0.90 0.96 1.16 1.01  --   --  0.99  CALCIUM 9.5 9.3 9.4 9.5  --   --  9.2  MG 2.4  --  2.3  --   --   --   --   PHOS 3.8  --   --   --   --   --   --    Liver Function Tests: No results for input(s): AST, ALT, ALKPHOS, BILITOT, PROT, ALBUMIN in the last 168 hours. No results for input(s): LIPASE, AMYLASE in the last 168 hours. No results for input(s): AMMONIA in the last 168 hours. CBC: Recent Labs  Lab 10/28/20 1338 10/31/20 0352 11/01/20 0253 11/01/20 1412 11/01/20 1413 11/02/20 0253  WBC 7.4 7.6 8.1  --   --  6.5  NEUTROABS 5.8 5.6 5.8  --   --  4.5  HGB 11.1* 11.7* 11.6* 13.3 13.3 10.5*  HCT 37.6* 38.8* 38.1* 39.0 39.0 35.5*  MCV 80.0 78.4* 77.4*  --   --  79.4*  PLT 146* 151 154  --   --  135*   Cardiac Enzymes: No results for input(s): CKTOTAL, CKMB, CKMBINDEX, TROPONINI in the last 168 hours. BNP: BNP (last 3 results) Recent Labs    10/12/20 0615 10/17/20 1050 10/28/20 1338  BNP 33.4 49.9 28.1    ProBNP (last 3 results) No results for input(s): PROBNP in the last 8760 hours.  CBG: Recent Labs  Lab 10/29/20 0507 10/30/20 0604 10/31/20 0558 11/01/20 0558 11/02/20 0545  GLUCAP 163* 130* 138* 124* 113*       Signed:  Alma Friendly, MD [facility] 11/02/2020, 6:47 PM

## 2020-11-02 NOTE — Progress Notes (Signed)
Discharge instructions given. Patient verbalized understanding and all questions were answered.  ?

## 2020-11-04 ENCOUNTER — Encounter (HOSPITAL_COMMUNITY): Payer: Self-pay | Admitting: Cardiology

## 2020-11-04 MED FILL — Lidocaine IV Infusion in D5W Inj 4 MG/ML: INTRAVENOUS | Qty: 500 | Status: AC

## 2020-11-05 ENCOUNTER — Other Ambulatory Visit (HOSPITAL_COMMUNITY): Payer: Self-pay

## 2020-11-05 ENCOUNTER — Telehealth (HOSPITAL_COMMUNITY): Payer: Self-pay | Admitting: Licensed Clinical Social Worker

## 2020-11-05 ENCOUNTER — Telehealth (HOSPITAL_COMMUNITY): Payer: Self-pay

## 2020-11-05 NOTE — TOC CM/SW Note (Addendum)
11/06/2020 [facility] accepted referral for [place]. Spoke to [place], rep.Jonnie Finner RN CCM, [facility] ED TOC CM [phone]  11/05/2020 1052 am TOC CM contacted [facility] to see if they are able to follow for [facility]. [place], [facility] ED TOC CM [phone]

## 2020-11-05 NOTE — Telephone Encounter (Signed)
I called Mr Gertner to schedule an appointment in accordance with the HF clinic request for close follow up. His phone went directly to voicemail so I left a message with my information and requested he call me back.   Jacquiline Doe, EMT 11/05/20

## 2020-11-05 NOTE — Telephone Encounter (Signed)
CSW received request from [facility] Paramedic to assist patient with transportation to his PCP appointment tomorrow. CSW made arrangements for PCP appointment through [facility] transportation.   In addition, CSW received request for a rollator walker with a seat to be covered by the patient care fund as patient's insurance will not cover since he received a walker recently that does not have a seat. Jonnie Finner, RN Care Manager noted that patient is in need of seated walker and requested through [employer]. CSW will submit for payment. Raquel Sarna, [place], [place]

## 2020-11-05 NOTE — Progress Notes (Signed)
Paramedicine Encounter    Patient ID: Charles Gray, male    DOB: May 26, 1955, 65 y.o. y.o.   MRN: [id number]   Patient Care Team: Enid Skeens., MD as PCP - General (Family Medicine) Berniece Salines, DO as PCP - Cardiology (Cardiology) Jorge Ny, LCSW as Social Worker (Licensed Clinical Social Worker)  Patient Active Problem List   Diagnosis Date Noted   Syncope 10/28/2020   CHF (congestive heart failure) ([place]) 10/28/2020   Chronic venous stasis dermatitis of both lower extremities 08/27/2020   Cellulitis of both lower extremities 08/14/2020   Microcytic anemia 08/14/2020   GERD without esophagitis 06/10/2020   Atrial fibrillation with rapid ventricular response ([place]) 03/24/2020   Acute on chronic right heart failure ([place]) 01/27/2020   Acute respiratory failure with hypoxia ([place])    Fatty liver disease, nonalcoholic    Shock ([place])    Personal history of DVT (deep vein thrombosis) 12/26/2019   Chronic anticoagulation 12/26/2019   Palpitations 12/26/2019   AF (paroxysmal atrial fibrillation) ([place]) 12/23/2019   Heart failure, systolic, acute ([place]) 68/34/1962   Acute on chronic congestive heart failure (HCC)    Acute on chronic diastolic CHF (congestive heart failure) ([place]) 08/23/2019   Chest pain 08/23/2019   Lactic acidosis 08/23/2019   History of pulmonary embolism 08/12/2019   Atrial tachycardia ([place]) 08/12/2019   Occasional tremors 08/12/2019   OSA (obstructive sleep apnea) 08/12/2019   Acute on chronic right-sided heart failure ([place]) 08/06/2019   Pulmonary embolism ([place]) 06/08/2019   Normocytic anemia 06/08/2019   Pulmonary emboli ([place]) 06/08/2019   Chronic heart failure with preserved ejection fraction ([place]) 05/30/2019   Class 2 severe obesity due to excess calories with serious comorbidity and body mass index (BMI) of 39.0 to 39.9 in adult ([place]) 05/30/2019   Morbid obesity with BMI of 40.0-44.9, adult ([place]) 05/30/2019   Hyponatremia    Drug induced constipation     Peripheral edema    Hypotension due to drugs    Hypoalbuminemia due to protein-calorie malnutrition (HCC)    Hypokalemia    Thrombocytopenia (HCC)    Anemia of chronic disease    Cirrhosis of liver without ascites ([place])    Chronic pain syndrome    Debility 03/13/2019   Portal hypertensive gastropathy ([place]) 03/06/2019   Dyspnea    GIB (gastrointestinal bleeding) 03/04/2019   Mixed hyperlipidemia 01/24/2019   Insomnia 01/24/2019   Hyperlipidemia 01/24/2019   Essential hypertension 01/24/2019   Lumbar disc herniation 01/24/2019    Current Outpatient Medications:    acetaminophen (TYLENOL) 500 MG tablet, Take 1,000 mg by mouth every 6 (six) hours as needed for moderate pain or headache., Disp: , Rfl:    apixaban (ELIQUIS) 5 MG TABS tablet, Take 1 tablet (5 mg total) by mouth 2 (two) times daily., Disp: 60 tablet, Rfl: 0   Buprenorphine HCl-Naloxone HCl 8-2 MG FILM, Place 1 tablet under the tongue in the morning and at bedtime. , Disp: , Rfl:    eplerenone (INSPRA) 50 MG tablet, Take 2 tablets (100 mg total) by mouth daily., Disp: 60 tablet, Rfl: 3   gabapentin (NEURONTIN) 800 MG tablet, Take 1 tablet (800 mg total) by mouth 3 (three) times daily., Disp: 90 tablet, Rfl: 1   metolazone (ZAROXOLYN) 2.5 MG tablet, Take 1 tablet (2.5 mg total) by mouth 3 (three) times a week. Monday, Wednesday and Fridays, Disp: 10 tablet, Rfl: 3   metoprolol succinate (TOPROL XL) 25 MG 24 hr tablet, Take 1 tablet (25 mg total) by mouth  2 (two) times daily., Disp: 60 tablet, Rfl: 2   ondansetron (ZOFRAN-ODT) 4 MG disintegrating tablet, TAKE 1 TABLET BY MOUTH EVERY 8 HOURS AS NEEDED FOR NAUSEA OR VOMITING, Disp: 20 tablet, Rfl: 0   pantoprazole (PROTONIX) 40 MG tablet, Take 40 mg by mouth 2 (two) times daily., Disp: , Rfl:    polyethylene glycol powder (GLYCOLAX/MIRALAX) 17 GM/SCOOP powder, Take 17 g by mouth daily., Disp: 255 g, Rfl: 0   potassium chloride SA (KLOR-CON) 20 MEQ tablet, Take 4 tablets (80 mEq  total) by mouth 2 (two) times daily., Disp: 240 tablet, Rfl: 11   pravastatin (PRAVACHOL) 20 MG tablet, Take 1 tablet (20 mg total) by mouth daily. (Patient taking differently: Take 20 mg by mouth at bedtime.), Disp: 30 tablet, Rfl: 1   sertraline (ZOLOFT) 25 MG tablet, Take 1 tablet (25 mg total) by mouth daily., Disp: 30 tablet, Rfl: 0   sodium chloride (OCEAN) 0.65 % SOLN nasal spray, Place 1 spray into both nostrils as needed for congestion., Disp: , Rfl:    tamsulosin (FLOMAX) 0.4 MG CAPS capsule, Take 1 capsule (0.4 mg total) by mouth daily., Disp: 30 capsule, Rfl: 3   torsemide (DEMADEX) 100 MG tablet, Take 1 tablet (100 mg total) by mouth 2 (two) times daily., Disp: 60 tablet, Rfl: 3   VENTOLIN HFA 108 (90 Base) MCG/ACT inhaler, Inhale 1 puff into the lungs every 4 (four) hours as needed for shortness of breath., Disp: 6.7 g, Rfl: 1   zolpidem (AMBIEN) 10 MG tablet, Take 10 mg by mouth at bedtime., Disp: , Rfl:    spironolactone (ALDACTONE) 50 MG tablet, Take 1 tablet (50 mg total) by mouth daily. (Patient not taking: Reported on 11/05/2020), Disp: 30 tablet, Rfl: 0 Allergies  Allergen Reactions   Penicillins     Broke out in convulsions as a child  Did it involve swelling of the face/tongue/throat, SOB, or low BP? N/A Did it involve sudden or severe rash/hives, skin peeling, or any reaction on the inside of your mouth or nose? N/A Did you need to seek medical attention at a hospital or doctor's office?N/A When did it last happen? N/A If all above answers are "NO", may proceed with cephalosporin use.      Social History   Socioeconomic History   Marital status: Single    Spouse name: Not on file   Number of children: Not on file   Years of education: Not on file   Highest education level: Not on file  Occupational History   Not on file  Tobacco Use   Smoking status: Never   Smokeless tobacco: Never  Vaping Use   Vaping Use: Never used  Substance and Sexual Activity    Alcohol use: Not Currently   Drug use: Not Currently    Types: Marijuana    Comment: Smoked for 30 years   Sexual activity: Not on file  Other Topics Concern   Not on file  Social History Narrative   Not on file   Social Determinants of Health   Financial Resource Strain: Medium Risk   Difficulty of Paying Living Expenses: Somewhat hard  Food Insecurity: No Food Insecurity   Worried About Running Out of Food in the Last Year: Never true   Ran Out of Food in the Last Year: Never true  Transportation Needs: Unmet Transportation Needs   Lack of Transportation (Medical): Yes   Lack of Transportation (Non-Medical): No  Physical Activity: Not on file  Stress: Not on  file  Social Connections: Not on file  Intimate Partner Violence: Not on file    Physical Exam      Future Appointments  Date Time Provider [place]  11/08/2020 12:00 PM [facility] PA/NP [facility] None    BP 110/60   Pulse 69   Resp 20   Wt (!) 374 lb (169.6 kg)   SpO2 98%   BMI 39.11 kg/m  Sunday-369 Weight yesterday-377 Last visit weight-376  Pt recently home from [facility], he reports not feeling well since yesterday. He feels weak and kind of dizzy-he has been told it was vertigo. he has PCP appointment tomor-jackie did approve his cone txp for that appointment since he has not seen his PCP for some time due to him being in and out of hosp. Advised him to ask him about the tamsulosin and his vertigo.  He didn't realize his metolazone was increased to mon/wed/fri-he took it today (Tuesday). I did advise him to take it thurs/sat this week and next week take it on the mon/wed/fri sch.  His legs are reddened and are swollen to him. I know his legs are swollen a lot and red at a baseline but I am not familiar enough as to what extent, he said they are beginning to look worse.  He reports snacking on salads and pickles last night.  He reports drinking a lot of fluids at time. So we talked about that.  He did not  seem to be in distress when I was here.  He asked if I could fill his pill box up for him-so that was done.  Meds verified. I suggested he ask transportation to stop by pharmacy on way home tomor to get his meds since he is having trouble with finding a ride there.    Marylouise Stacks, [facility] Paramedic  11/05/20

## 2020-11-06 ENCOUNTER — Encounter (HOSPITAL_COMMUNITY): Payer: Self-pay

## 2020-11-06 ENCOUNTER — Telehealth: Payer: Self-pay | Admitting: Student in an Organized Health Care Education/Training Program

## 2020-11-06 NOTE — Telephone Encounter (Signed)
Patient paged operator: swelling in both feet, severe pain, redness in both feet. "Hurts so bad he could cry." Recent discharge 11/02/20 for HFpEF exacerbation. Last dose metolazone was Tuesday AM around 0730 (2.5 mg PO daily, missed Monday dose). LD torsemide 100 mg PO bid at 1800 last night. No missed doses of torsemide since discharge. Swelling in feet has been increasing since he was discharged but much worse overnight. Nausea the past few hours. Similar presentations to prior. 377 lb this AM, took metolazone and went to 373 lbs. Having some urinary urgency with metolazone. Mild SOB. VS stable. Not on O2. Metolazone makes him "feel like he is dehydrated." He loses a significant amount of wt but doesn't feel well.   Discussed sx extensively and recommended he either take additional metolazone now (2.5 mg PO x1) followed by torsemide 100 mg PO now and then resume home regimen or be evaluated in the ED. He had good response to metolazone+torsemide over the past 24h with regards to his wt (373 lb from 377 lb, Sunday 369 lb, d/c wt incorrect listed as 76.7 kg).  I explained extensively the risk and benefits of staying home versus being evaluated in the emergency department.  After a lot of discussion he said that he more than likely will come to the ED but he is not 100% sure right now.  I spent 28 minutes discussing the different options above.  He was hoping for expedited evaluation and possible admission through the emergency department and not having to wait in triage.  He will need ambulance transport if he decides to come to the ED.

## 2020-11-07 ENCOUNTER — Other Ambulatory Visit (HOSPITAL_COMMUNITY): Payer: Self-pay | Admitting: Cardiology

## 2020-11-07 NOTE — H&P (View-Only) (Signed)
ADVANCED HF CLINIC NOTE  Primary Care: Enid Skeens., MD Primary Cardiologist: Dr. Berniece Salines, DO  HF Cardiology: Dr. [name]  HPI: 65 y.o. 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    He has had multiple hospitalizations in 2021 for CHF exacerbation, requiring IV Lasix and change in PO regimen from lasix to torsemide. Echo 4/21 showed normal LVEF 55-60% w/ G1DD. RV not well visualized, but felt to have component of RV failure. [place] 4/21 showed normal PA pressure and normal cardiac output. PFTs also completed and c/w mild obstructive airway disease.   In 7/21, he had a coronary CTA.  This showed no coronary calcium but there was a concern for soft plaque in the distal LCx and distal RCA with hemodynamically significant stenosis.  He was managed medically. He has not had any chest pain, but was started on ranolazine after his CTA.  He feels like it makes his "head fuzzy."    Admitted with A/C diastolic heart failure. Diuresed with IV lasix and transitioned to bumex 3 mg twice a day. Discharge weight 360 pounds. Discharged 02/09/2020.   He was admitted again in 12/21 with CHF.  Echo in 12/21 showed EF 66-44%, grade 2 diastolic dysfunction, RV poorly visualized.   OV on 12/21 still having significant exertional dyspnea, short of breath just walking around the house.  + Orthopnea, has to sleep on the sofa.  Sleeps poorly, cannot tolerate CPAP.  Cardiomems was discussed but patient reluctant, Jardiance added, metolazone increased to 2 x/week, metoprolol stopped, and Toprol XL 25 mg daily started.  Admitted 0/34/74 with A/C diastolic heart failure. Diuresed with IV lasix + metolazone. Transitioned to torsemide 60 mg twice a day and metolazone twice a day. D/C weight 364 pounds.   Admitted 5/28//22 with cellulitis. Treated with antibiotics.   Returns 09/10/20 for HF follow up. Had palpitations and  EMS checked his EKG. EKG showed SR with rate in the 70s. Having ongoing leg swelling. SOB with exertion.  Weight at home 373-381 pounds. Taking all medications. Meds are set up in a pill bo by HF Paramed[facility]. Followed cosely by HF Paramed[facility]. Requires assistance with transportation. Lives alone. Zio patch placed.  Admitted 09/20/20 admitted with SOB and leg swelling secondary to A/CHF. He was diuresed with IV lasix 5 L, weight 371 at discharge.  At his HF follow up 10/08/20, still dizzy with turning head, drinking >2L/fluid day. Volume was up and he was instructed to take metolazone that day and continue twice weekly dosing.  He was admitted for A/C CHF 7/22 after presenting with LE swelling and complaints of dizziness with fall.  He had failed outpatient IV diuresis. He was diuresed with IV lasix + metolazone. He underwent RHC showing optimized filling pressures and preserved CO/CI. Discussed Cardiomems inpatient , and he agreed to work up. Insurance approval pending. He was discharged on torsemide 100 mg bid + metolazone M/W/F. Discharge weight 374 lbs.  Today he returns for post hospitalization HF follow up. Overall feeling poor. Struggling with swelling in his feet. PCP put him on Levaquin but he does not want to take because he thinks it will hurt his stomach. He says his breathing is better now that his weight is down. Still has dizziness with head movement. Denies CP. Chronically sleeps upright.  Appetite ok. No fever or chills. Weight at home 365 pounds. Taking all medications.   ECG (personally reviewed): SR 80 bpm  ReDs: 38%  Labs (6/21): K 4.3, creatinine 1.03 Labs (10/21): LDL 73 Labs (12/21): K 3.8, creatinine 0.98 Labs (3/22): K 3.1 Creatinine 0.9  Labs (5/22): K 3.7 Creatinine 0.94  Labs (6/22): K 3.3, creatinine 1.13 Labs (7/22): K 3.5, creatinine 0.99  PMH: 1. HTN 2. OSA 3. Hyperlipidemia 4. Low back pain 5. PE/DVT in 2/21.  - V/Q scan in 6/21 was not suggestive of chronic  PE.  6. Nonalcoholic steatohepatitis.  7. GI bleed in 10/20.  8. Anxiety 9. CAD: Coronary CTA in 7/21 with calcium score 0 but concern for soft plaque distal CFX and RCA.  By [facility], there was flow-limiting stenosis in distal CFX and distal RCA.  10. Chronic diastolic CHF: Echo in 6/50 with EF 55-60%, RV poorly visualized.  - RHC (4/21): mean RA 8, PA 20/8, mean PCWP 11, CI 2.8 - Echo (12/21):EF 35-46%, grade 2 diastolic dysfunction, RV poorly visualized.   11. PFTs (4/21): Mild obstruction.  12. ABIs (10/21): Normal.  Review of Systems: All systems reviewed and negative except as per HPI.   Current Outpatient Medications  Medication Sig Dispense Refill   acetaminophen (TYLENOL) 500 MG tablet Take 1,000 mg by mouth every 6 (six) hours as needed for moderate pain or headache.     apixaban (ELIQUIS) 5 MG TABS tablet Take 1 tablet (5 mg total) by mouth 2 (two) times daily. 60 tablet 0   Buprenorphine HCl-Naloxone HCl 8-2 MG FILM Place 1 tablet under the tongue in the morning and at bedtime.      eplerenone (INSPRA) 50 MG tablet Take 2 tablets (100 mg total) by mouth daily. 60 tablet 3   gabapentin (NEURONTIN) 800 MG tablet Take 1 tablet (800 mg total) by mouth 3 (three) times daily. 90 tablet 1   metolazone (ZAROXOLYN) 2.5 MG tablet Take 1 tablet (2.5 mg total) by mouth 3 (three) times a week. Monday, Wednesday and Fridays 10 tablet 3   metoprolol succinate (TOPROL XL) 25 MG 24 hr tablet Take 1 tablet (25 mg total) by mouth 2 (two) times daily. 60 tablet 2   ondansetron (ZOFRAN-ODT) 4 MG disintegrating tablet TAKE 1 TABLET BY MOUTH EVERY 8 HOURS AS NEEDED FOR NAUSEA OR VOMITING 20 tablet 0   pantoprazole (PROTONIX) 40 MG tablet Take 40 mg by mouth 2 (two) times daily.     polyethylene glycol powder (GLYCOLAX/MIRALAX) 17 GM/SCOOP powder Take 17 g by mouth daily. 255 g 0   potassium chloride SA (KLOR-CON) 20 MEQ tablet Take 4 tablets (80 mEq total) by mouth 2 (two) times daily. 240 tablet 11    pravastatin (PRAVACHOL) 20 MG tablet Take 1 tablet (20 mg total) by mouth daily. (Patient taking differently: Take 20 mg by mouth at bedtime.) 30 tablet 1   sertraline (ZOLOFT) 25 MG tablet TAKE 1 TABLET BY MOUTH DAILY 30 tablet 0   sodium chloride (OCEAN) 0.65 % SOLN nasal spray Place 1 spray into both nostrils as needed for congestion.     torsemide (DEMADEX) 100 MG tablet Take 1 tablet (100 mg total) by mouth 2 (two) times daily. 60 tablet 3   VENTOLIN HFA 108 (90 Base) MCG/ACT inhaler Inhale 1 puff into the lungs every 4 (four) hours as needed for shortness of breath. 6.7 g 1   zolpidem (AMBIEN) 10 MG tablet Take 10 mg by mouth at bedtime.     spironolactone (ALDACTONE) 50 MG tablet Take 1 tablet (50 mg total) by mouth daily. (Patient not taking: No sig reported) 30  tablet 0   tamsulosin (FLOMAX) 0.4 MG CAPS capsule Take 1 capsule (0.4 mg total) by mouth daily. (Patient not taking: Reported on 11/08/2020) 30 capsule 3   No current facility-administered medications for this encounter.   Allergies  Allergen Reactions   Penicillins     Broke out in convulsions as a child  Did it involve swelling of the face/tongue/throat, SOB, or low BP? N/A Did it involve sudden or severe rash/hives, skin peeling, or any reaction on the inside of your mouth or nose? N/A Did you need to seek medical attention at a hospital or doctor's office?N/A When did it last happen? N/A If all above answers are "NO", may proceed with cephalosporin use.   Social History   Socioeconomic History   Marital status: Single    Spouse name: Not on file   Number of children: Not on file   Years of education: Not on file   Highest education level: Not on file  Occupational History   Not on file  Tobacco Use   Smoking status: Never   Smokeless tobacco: Never  Vaping Use   Vaping Use: Never used  Substance and Sexual Activity   Alcohol use: Not Currently   Drug use: Not Currently    Types: Marijuana    Comment: Smoked  for 30 years   Sexual activity: Not on file  Other Topics Concern   Not on file  Social History Narrative   Not on file   Social Determinants of Health   Financial Resource Strain: Medium Risk   Difficulty of Paying Living Expenses: Somewhat hard  Food Insecurity: No Food Insecurity   Worried About Running Out of Food in the Last Year: Never true   Ran Out of Food in the Last Year: Never true  Transportation Needs: Unmet Transportation Needs   Lack of Transportation (Medical): Yes   Lack of Transportation (Non-Medical): No  Physical Activity: Not on file  Stress: Not on file  Social Connections: Not on file  Intimate Partner Violence: Not on file    Family History  Problem Relation Age of Onset   Hyperlipidemia Mother    Hypertension Mother    Stroke Mother    CAD Maternal Grandmother    CAD Maternal Grandfather    BP 124/82   Pulse 78   Ht 6' 10"  (2.083 m)   Wt (!) 165.6 kg (365 lb)   SpO2 95%   BMI 38.17 kg/m   Wt Readings from Last 3 Encounters:  11/08/20 (!) 165.6 kg (365 lb)  11/05/20 (!) 169.6 kg (374 lb)  11/02/20 76.7 kg (169 lb 1.6 oz)   PHYSICAL EXAM: ReDs 38% General:  NAD. No resp difficulty HEENT: Normal Neck: Supple. Thick neck. Carotids 2+ bilat; no bruits. No lymphadenopathy or thryomegaly appreciated. Cor: PMI nondisplaced. Regular rate & rhythm. No rubs, gallops or murmurs. Lungs: Clear Abdomen: Obese, nontender, nondistended. No hepatosplenomegaly. No bruits or masses. Good bowel sounds. Extremities: No cyanosis, clubbing, rash, bilateral pedal edema w/ erythema, BLE 1+ edema (much improved); chronic venous stasis changes to BLE, no open wounds; right great toe in cast Neuro: Alert & oriented x 3, cranial nerves grossly intact. Moves all 4 extremities w/o difficulty. Affect pleasant.  ASSESSMENT & PLAN: 1. Chronic diastolic CHF:  Suspect prominent RV dysfunction.  Echo this admission with EF 60-65%, mild LVH, normal RV, unable to estimate PA  systolic pressure, IVC not visualized.  Multiple CHF admissions despite aggressive outpatient diuretic regimen and paramedicine.  He had  IV Lasix in clinic twice in the last couple weeks.  Creatinine stable and has diuresed reasonably so far.  Exam difficult for volume. RHC this admission with optimized filling pressures, PCWP 13, CO/CI 7.3/2.4. NYHA III, he does not appear volume overloaded today. - Continue torsemide 100 mg bid + metolazone 2.5 mg on M/W/F + extra 40 KCl on metolazone days.  - Continue spironolactone 50 mg daily. BMET today. - He is off Jardiance (GI upset). - He has agreed to [employer] work up. Insurance approval pending, re-checked status today. 2. H/o PE/DVT: Suspect due to sedentary lifestyle.  Occurred in 2/21.  V/Q scan in 6/21 did not show evidence for chronic PE.   - Continue apixaban. No bleeding issues.  3. OSA: Cannot tolerate CPAP. 4. Anxiety/depression: This is a major issue.   - Continue sertraline 25 mg daily.  - He refuses psych referral or increase in sertraline today.  5. CAD: Cath 1/22 with no significant CAD. 6. Hypertension: Stable. - Continue Toprol XL 25 mg bid.   7. Atrial fibrillation: Paroxysmal. He is in NSR. - Continue apixaban. - Continue Toprol XL.  Follow up in 2-3 weeks with APP. Continue with Paramedicine, appreciate their assistance.   - Personally discussed with Dr. [name], he needs to take and complete his course of levofloxacin prescribed by his PCP. Discussed this with patient and he is agreeable to try course of abx. If this is unsuccessful, consider doxycycline.  Maricela Bo [facility] FNP-BC  11/08/2020

## 2020-11-07 NOTE — Progress Notes (Addendum)
ADVANCED HF CLINIC NOTE  Primary Care: Enid Skeens., MD Primary Cardiologist: Dr. Berniece Salines, DO  HF Cardiology: Dr. [name]  HPI: 65 y.o. 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    He has had multiple hospitalizations in 2021 for CHF exacerbation, requiring IV Lasix and change in PO regimen from lasix to torsemide. Echo 4/21 showed normal LVEF 55-60% w/ G1DD. RV not well visualized, but felt to have component of RV failure. [place] 4/21 showed normal PA pressure and normal cardiac output. PFTs also completed and c/w mild obstructive airway disease.   In 7/21, he had a coronary CTA.  This showed no coronary calcium but there was a concern for soft plaque in the distal LCx and distal RCA with hemodynamically significant stenosis.  He was managed medically. He has not had any chest pain, but was started on ranolazine after his CTA.  He feels like it makes his "head fuzzy."    Admitted with A/C diastolic heart failure. Diuresed with IV lasix and transitioned to bumex 3 mg twice a day. Discharge weight 360 pounds. Discharged 02/09/2020.   He was admitted again in 12/21 with CHF.  Echo in 12/21 showed EF 64-33%, grade 2 diastolic dysfunction, RV poorly visualized.   OV on 12/21 still having significant exertional dyspnea, short of breath just walking around the house.  + Orthopnea, has to sleep on the sofa.  Sleeps poorly, cannot tolerate CPAP.  Cardiomems was discussed but patient reluctant, Jardiance added, metolazone increased to 2 x/week, metoprolol stopped, and Toprol XL 25 mg daily started.  Admitted 2/95/18 with A/C diastolic heart failure. Diuresed with IV lasix + metolazone. Transitioned to torsemide 60 mg twice a day and metolazone twice a day. D/C weight 364 pounds.   Admitted 5/28//22 with cellulitis. Treated with antibiotics.   Returns 09/10/20 for HF follow up. Had palpitations and  EMS checked his EKG. EKG showed SR with rate in the 70s. Having ongoing leg swelling. SOB with exertion.  Weight at home 373-381 pounds. Taking all medications. Meds are set up in a pill bo by HF Paramedicine. Followed cosely by HF Paramedicine. Requires assistance with transportation. Lives alone. Zio patch placed.  Admitted 09/20/20 admitted with SOB and leg swelling secondary to A/CHF. He was diuresed with IV lasix 5 L, weight 371 at discharge.  At his HF follow up 10/08/20, still dizzy with turning head, drinking >2L/fluid day. Volume was up and he was instructed to take metolazone that day and continue twice weekly dosing.  He was admitted for A/C CHF 7/22 after presenting with LE swelling and complaints of dizziness with fall.  He had failed outpatient IV diuresis. He was diuresed with IV lasix + metolazone. He underwent RHC showing optimized filling pressures and preserved CO/CI. Discussed Cardiomems inpatient , and he agreed to work up. Insurance approval pending. He was discharged on torsemide 100 mg bid + metolazone M/W/F. Discharge weight 374 lbs.  Today he returns for post hospitalization HF follow up. Overall feeling poor. Struggling with swelling in his feet. PCP put him on Levaquin but he does not want to take because he thinks it will hurt his stomach. He says his breathing is better now that his weight is down. Still has dizziness with head movement. Denies CP. Chronically sleeps upright.  Appetite ok. No fever or chills. Weight at home 365 pounds. Taking all medications.   ECG (personally reviewed): SR 80 bpm  ReDs: 38%  Labs (6/21): K 4.3, creatinine 1.03 Labs (10/21): LDL 73 Labs (12/21): K 3.8, creatinine 0.98 Labs (3/22): K 3.1 Creatinine 0.9  Labs (5/22): K 3.7 Creatinine 0.94  Labs (6/22): K 3.3, creatinine 1.13 Labs (7/22): K 3.5, creatinine 0.99  PMH: 1. HTN 2. OSA 3. Hyperlipidemia 4. Low back pain 5. PE/DVT in 2/21.  - V/Q scan in 6/21 was not suggestive of chronic  PE.  6. Nonalcoholic steatohepatitis.  7. GI bleed in 10/20.  8. Anxiety 9. CAD: Coronary CTA in 7/21 with calcium score 0 but concern for soft plaque distal CFX and RCA.  By [facility], there was flow-limiting stenosis in distal CFX and distal RCA.  10. Chronic diastolic CHF: Echo in 3/78 with EF 55-60%, RV poorly visualized.  - RHC (4/21): mean RA 8, PA 20/8, mean PCWP 11, CI 2.8 - Echo (12/21):EF 58-85%, grade 2 diastolic dysfunction, RV poorly visualized.   11. PFTs (4/21): Mild obstruction.  12. ABIs (10/21): Normal.  Review of Systems: All systems reviewed and negative except as per HPI.   Current Outpatient Medications  Medication Sig Dispense Refill   acetaminophen (TYLENOL) 500 MG tablet Take 1,000 mg by mouth every 6 (six) hours as needed for moderate pain or headache.     apixaban (ELIQUIS) 5 MG TABS tablet Take 1 tablet (5 mg total) by mouth 2 (two) times daily. 60 tablet 0   Buprenorphine HCl-Naloxone HCl 8-2 MG FILM Place 1 tablet under the tongue in the morning and at bedtime.      eplerenone (INSPRA) 50 MG tablet Take 2 tablets (100 mg total) by mouth daily. 60 tablet 3   gabapentin (NEURONTIN) 800 MG tablet Take 1 tablet (800 mg total) by mouth 3 (three) times daily. 90 tablet 1   metolazone (ZAROXOLYN) 2.5 MG tablet Take 1 tablet (2.5 mg total) by mouth 3 (three) times a week. Monday, Wednesday and Fridays 10 tablet 3   metoprolol succinate (TOPROL XL) 25 MG 24 hr tablet Take 1 tablet (25 mg total) by mouth 2 (two) times daily. 60 tablet 2   ondansetron (ZOFRAN-ODT) 4 MG disintegrating tablet TAKE 1 TABLET BY MOUTH EVERY 8 HOURS AS NEEDED FOR NAUSEA OR VOMITING 20 tablet 0   pantoprazole (PROTONIX) 40 MG tablet Take 40 mg by mouth 2 (two) times daily.     polyethylene glycol powder (GLYCOLAX/MIRALAX) 17 GM/SCOOP powder Take 17 g by mouth daily. 255 g 0   potassium chloride SA (KLOR-CON) 20 MEQ tablet Take 4 tablets (80 mEq total) by mouth 2 (two) times daily. 240 tablet 11    pravastatin (PRAVACHOL) 20 MG tablet Take 1 tablet (20 mg total) by mouth daily. (Patient taking differently: Take 20 mg by mouth at bedtime.) 30 tablet 1   sertraline (ZOLOFT) 25 MG tablet TAKE 1 TABLET BY MOUTH DAILY 30 tablet 0   sodium chloride (OCEAN) 0.65 % SOLN nasal spray Place 1 spray into both nostrils as needed for congestion.     torsemide (DEMADEX) 100 MG tablet Take 1 tablet (100 mg total) by mouth 2 (two) times daily. 60 tablet 3   VENTOLIN HFA 108 (90 Base) MCG/ACT inhaler Inhale 1 puff into the lungs every 4 (four) hours as needed for shortness of breath. 6.7 g 1   zolpidem (AMBIEN) 10 MG tablet Take 10 mg by mouth at bedtime.     spironolactone (ALDACTONE) 50 MG tablet Take 1 tablet (50 mg total) by mouth daily. (Patient not taking: No sig reported) 30  tablet 0   tamsulosin (FLOMAX) 0.4 MG CAPS capsule Take 1 capsule (0.4 mg total) by mouth daily. (Patient not taking: Reported on 11/08/2020) 30 capsule 3   No current facility-administered medications for this encounter.   Allergies  Allergen Reactions   Penicillins     Broke out in convulsions as a child  Did it involve swelling of the face/tongue/throat, SOB, or low BP? N/A Did it involve sudden or severe rash/hives, skin peeling, or any reaction on the inside of your mouth or nose? N/A Did you need to seek medical attention at a hospital or doctor's office?N/A When did it last happen? N/A If all above answers are "NO", may proceed with cephalosporin use.   Social History   Socioeconomic History   Marital status: Single    Spouse name: Not on file   Number of children: Not on file   Years of education: Not on file   Highest education level: Not on file  Occupational History   Not on file  Tobacco Use   Smoking status: Never   Smokeless tobacco: Never  Vaping Use   Vaping Use: Never used  Substance and Sexual Activity   Alcohol use: Not Currently   Drug use: Not Currently    Types: Marijuana    Comment: Smoked  for 30 years   Sexual activity: Not on file  Other Topics Concern   Not on file  Social History Narrative   Not on file   Social Determinants of Health   Financial Resource Strain: Medium Risk   Difficulty of Paying Living Expenses: Somewhat hard  Food Insecurity: No Food Insecurity   Worried About Running Out of Food in the Last Year: Never true   Ran Out of Food in the Last Year: Never true  Transportation Needs: Unmet Transportation Needs   Lack of Transportation (Medical): Yes   Lack of Transportation (Non-Medical): No  Physical Activity: Not on file  Stress: Not on file  Social Connections: Not on file  Intimate Partner Violence: Not on file    Family History  Problem Relation Age of Onset   Hyperlipidemia Mother    Hypertension Mother    Stroke Mother    CAD Maternal Grandmother    CAD Maternal Grandfather    BP 124/82   Pulse 78   Ht 6' 10"  (2.083 m)   Wt (!) 165.6 kg (365 lb)   SpO2 95%   BMI 38.17 kg/m   Wt Readings from Last 3 Encounters:  11/08/20 (!) 165.6 kg (365 lb)  11/05/20 (!) 169.6 kg (374 lb)  11/02/20 76.7 kg (169 lb 1.6 oz)   PHYSICAL EXAM: ReDs 38% General:  NAD. No resp difficulty HEENT: Normal Neck: Supple. Thick neck. Carotids 2+ bilat; no bruits. No lymphadenopathy or thryomegaly appreciated. Cor: PMI nondisplaced. Regular rate & rhythm. No rubs, gallops or murmurs. Lungs: Clear Abdomen: Obese, nontender, nondistended. No hepatosplenomegaly. No bruits or masses. Good bowel sounds. Extremities: No cyanosis, clubbing, rash, bilateral pedal edema w/ erythema, BLE 1+ edema (much improved); chronic venous stasis changes to BLE, no open wounds; right great toe in cast Neuro: Alert & oriented x 3, cranial nerves grossly intact. Moves all 4 extremities w/o difficulty. Affect pleasant.  ASSESSMENT & PLAN: 1. Chronic diastolic CHF:  Suspect prominent RV dysfunction.  Echo this admission with EF 60-65%, mild LVH, normal RV, unable to estimate PA  systolic pressure, IVC not visualized.  Multiple CHF admissions despite aggressive outpatient diuretic regimen and paramedicine.  He had  IV Lasix in clinic twice in the last couple weeks.  Creatinine stable and has diuresed reasonably so far.  Exam difficult for volume. RHC this admission with optimized filling pressures, PCWP 13, CO/CI 7.3/2.4. NYHA III, he does not appear volume overloaded today. - Continue torsemide 100 mg bid + metolazone 2.5 mg on M/W/F + extra 40 KCl on metolazone days.  - Continue spironolactone 50 mg daily. BMET today. - He is off Jardiance (GI upset). - He has agreed to [employer] work up. Insurance approval pending, re-checked status today. 2. H/o PE/DVT: Suspect due to sedentary lifestyle.  Occurred in 2/21.  V/Q scan in 6/21 did not show evidence for chronic PE.   - Continue apixaban. No bleeding issues.  3. OSA: Cannot tolerate CPAP. 4. Anxiety/depression: This is a major issue.   - Continue sertraline 25 mg daily.  - He refuses psych referral or increase in sertraline today.  5. CAD: Cath 1/22 with no significant CAD. 6. Hypertension: Stable. - Continue Toprol XL 25 mg bid.   7. Atrial fibrillation: Paroxysmal. He is in NSR. - Continue apixaban. - Continue Toprol XL.  Follow up in 2-3 weeks with APP. Continue with Paramedicine, appreciate their assistance.   - Personally discussed with Dr. [name], he needs to take and complete his course of levofloxacin prescribed by his PCP. Discussed this with patient and he is agreeable to try course of abx. If this is unsuccessful, consider doxycycline.  Maricela Bo [facility] FNP-BC  11/08/2020

## 2020-11-08 ENCOUNTER — Encounter (HOSPITAL_COMMUNITY): Payer: Self-pay

## 2020-11-08 ENCOUNTER — Other Ambulatory Visit: Payer: Self-pay

## 2020-11-08 ENCOUNTER — Ambulatory Visit (HOSPITAL_COMMUNITY)
Admission: RE | Admit: 2020-11-08 | Discharge: 2020-11-08 | Disposition: A | Discharge status: Home / Self Care | Payer: Medicare HMO | Source: Ambulatory Visit | Attending: Family Medicine | Admitting: Family Medicine

## 2020-11-08 VITALS — BP 124/82 | HR 78 | Ht >= 80 in | Wt 365.0 lb

## 2020-11-08 DIAGNOSIS — Z8249 Family history of ischemic heart disease and other diseases of the circulatory system: Secondary | ICD-10-CM | POA: Diagnosis not present

## 2020-11-08 DIAGNOSIS — Z79899 Other long term (current) drug therapy: Secondary | ICD-10-CM | POA: Diagnosis not present

## 2020-11-08 DIAGNOSIS — R42 Dizziness and giddiness: Secondary | ICD-10-CM | POA: Insufficient documentation

## 2020-11-08 DIAGNOSIS — K30 Functional dyspepsia: Secondary | ICD-10-CM | POA: Insufficient documentation

## 2020-11-08 DIAGNOSIS — Z86711 Personal history of pulmonary embolism: Secondary | ICD-10-CM

## 2020-11-08 DIAGNOSIS — F419 Anxiety disorder, unspecified: Secondary | ICD-10-CM

## 2020-11-08 DIAGNOSIS — Z7901 Long term (current) use of anticoagulants: Secondary | ICD-10-CM | POA: Diagnosis not present

## 2020-11-08 DIAGNOSIS — F1290 Cannabis use, unspecified, uncomplicated: Secondary | ICD-10-CM | POA: Diagnosis not present

## 2020-11-08 DIAGNOSIS — F32A Depression, unspecified: Secondary | ICD-10-CM | POA: Insufficient documentation

## 2020-11-08 DIAGNOSIS — I5032 Chronic diastolic (congestive) heart failure: Secondary | ICD-10-CM | POA: Diagnosis not present

## 2020-11-08 DIAGNOSIS — G4733 Obstructive sleep apnea (adult) (pediatric): Secondary | ICD-10-CM

## 2020-11-08 DIAGNOSIS — I2510 Atherosclerotic heart disease of native coronary artery without angina pectoris: Secondary | ICD-10-CM | POA: Diagnosis not present

## 2020-11-08 DIAGNOSIS — I5033 Acute on chronic diastolic (congestive) heart failure: Secondary | ICD-10-CM | POA: Diagnosis not present

## 2020-11-08 DIAGNOSIS — M7989 Other specified soft tissue disorders: Secondary | ICD-10-CM | POA: Diagnosis not present

## 2020-11-08 DIAGNOSIS — I110 Hypertensive heart disease with heart failure: Secondary | ICD-10-CM | POA: Diagnosis present

## 2020-11-08 DIAGNOSIS — Z86718 Personal history of other venous thrombosis and embolism: Secondary | ICD-10-CM | POA: Insufficient documentation

## 2020-11-08 DIAGNOSIS — Z880 Allergy status to penicillin: Secondary | ICD-10-CM | POA: Insufficient documentation

## 2020-11-08 DIAGNOSIS — I480 Paroxysmal atrial fibrillation: Secondary | ICD-10-CM

## 2020-11-08 DIAGNOSIS — I10 Essential (primary) hypertension: Secondary | ICD-10-CM

## 2020-11-08 LAB — BASIC METABOLIC PANEL
Anion gap: 11 (ref 5–15)
BUN: 16 mg/dL (ref 8–23)
CO2: 36 mmol/L — ABNORMAL HIGH (ref 22–32)
Calcium: 9.4 mg/dL (ref 8.9–10.3)
Chloride: 89 mmol/L — ABNORMAL LOW (ref 98–111)
Creatinine, Ser: 1.12 mg/dL (ref 0.61–1.24)
GFR, Estimated: >60 mL/min (ref >60)
Glucose, Bld: 111 mg/dL — ABNORMAL HIGH (ref 70–99)
Potassium: 3.5 mmol/L (ref 3.5–5.1)
Sodium: 136 mmol/L (ref 135–145)

## 2020-11-08 LAB — CBC
HCT: 38.9 % — ABNORMAL LOW (ref 39.0–52.0)
Hemoglobin: 11.6 g/dL — ABNORMAL LOW (ref 13.0–17.0)
MCH: 23.6 pg — ABNORMAL LOW (ref 26.0–34.0)
MCHC: 29.8 g/dL — ABNORMAL LOW (ref 30.0–36.0)
MCV: 79.2 fL — ABNORMAL LOW (ref 80.0–100.0)
Platelets: 162 10*3/uL (ref 150–400)
RBC: 4.91 MIL/uL (ref 4.22–5.81)
RDW: 17.1 % — ABNORMAL HIGH (ref 11.5–15.5)
WBC: 10.3 10*3/uL (ref 4.0–10.5)
nRBC: 0 % (ref 0.0–0.2)

## 2020-11-08 NOTE — Addendum Note (Signed)
Encounter addended by: Rafael Bihari, FNP on: 11/08/2020 10:32 PM  Actions taken: Clinical Note Signed

## 2020-11-08 NOTE — Patient Instructions (Addendum)
It was great to see you today! No medication changes are needed at this time.  Labs today We will only contact you if something comes back abnormal or we need to make some changes. Otherwise no news is good news!  Your physician recommends that you schedule a follow-up appointment in: 2-3 weeks.  milAt the [facility], you and your health needs are our priority. As part of our continuing mission to provide you with exceptional heart care, we have created designated Provider Care Teams. These Care Teams include your primary Cardiologist (physician) and Advanced Practice Providers (APPs- Physician Assistants and Nurse Practitioners) who all work together to provide you with the care you need, when you need it.   You may see any of the following providers on your designated Care Team at your next follow up: Dr Glori Bickers Dr Loralie Champagne Dr Patrice Paradise, NP Lyda Jester, [place] Ginnie Smart Audry Riles, PharmD   Please be sure to bring in all your medications bottles to every appointment.

## 2020-11-08 NOTE — Progress Notes (Signed)
ReDS Vest / Clip - 11/08/20 1200       ReDS Vest / Clip   Station Marker D    Ruler Value 43    ReDS Value Range Moderate volume overload    ReDS Actual Value 38

## 2020-11-09 ENCOUNTER — Encounter (HOSPITAL_COMMUNITY): Payer: Self-pay

## 2020-11-11 ENCOUNTER — Telehealth (HOSPITAL_COMMUNITY): Payer: Self-pay | Admitting: Cardiology

## 2020-11-11 ENCOUNTER — Encounter (HOSPITAL_COMMUNITY): Payer: Self-pay

## 2020-11-11 NOTE — Telephone Encounter (Signed)
Cardiomems approval-paramedicine to review during home visit 11/12/20      You are scheduled for a Cardiac Catheterization-CardioMems implant on Thursday, August 4 with Dr. Loralie Champagne.  1. Please arrive at the [facility] (Main Entrance A) at [facility]: [address] [place], [place] [address] at 12:30 PM (This time is two hours before your procedure to ensure your preparation). Free valet parking service is available.   Special note: Every effort is made to have your procedure done on time. Please understand that emergencies sometimes delay scheduled procedures.  2. Diet: Do not eat solid foods after midnight.  The patient may have clear liquids until 5am upon the day of the procedure.  3. Labs: pre procedure labs are not needed  4. Medication instructions in preparation for your procedure:   Contrast Allergy: No  Stop taking Eliquis (Apixiban) on Tuesday, August 2.  Stop taking, Torsemide (Demadex) Thursday, August 4,    On the morning of your procedure, take your Aspirin and any morning medicines NOT listed above.  You may use sips of water.  5. Plan for one night stay--bring personal belongings. 6. Bring a current list of your medications and current insurance cards. 7. You MUST have a responsible person to drive you home. 8. Someone MUST be with you the first 24 hours after you arrive home or your discharge will be delayed. 9. Please wear clothes that are easy to get on and off and wear slip-on shoes.    During your stay  The right heart catheterization  will take approximately 1 hour  You will receive patient education and necessary resources for transmissions (special pillow and antennae) prior to leaving  Once you are at home  Lay on the pillow and send a transmission every morning Make sure to take your Plavix 75 mg daily for one month and Aspirin _81____mg daily for life Our office will call you weekly for the first 3 weeks You will be seen for a  follow up appointment as scheduled  You will receive periodic phone calls either from our office or automated system You will receive a bill from our office once a month

## 2020-11-12 ENCOUNTER — Other Ambulatory Visit (HOSPITAL_COMMUNITY): Payer: Self-pay | Admitting: *Deleted

## 2020-11-12 ENCOUNTER — Other Ambulatory Visit (HOSPITAL_COMMUNITY): Payer: Self-pay

## 2020-11-12 MED ORDER — EPLERENONE 50 MG PO TABS
100.0000 mg | ORAL_TABLET | Freq: Every day | ORAL | 3 refills | Status: DC
Start: 2020-11-12 — End: 2021-03-26

## 2020-11-12 NOTE — Progress Notes (Signed)
Paramedicine Encounter    Patient ID: Charles Gray, male    DOB: 13-Dec-1955, 65 y.o.   MRN: [id number]   Patient Care Team: Enid Skeens., MD as PCP - General (Family Medicine) Berniece Salines, DO as PCP - Cardiology (Cardiology) Jorge Ny, LCSW as Social Worker (Licensed Clinical Social Worker)  Patient Active Problem List   Diagnosis Date Noted   Syncope 10/28/2020   CHF (congestive heart failure) ([place]) 10/28/2020   Chronic venous stasis dermatitis of both lower extremities 08/27/2020   Cellulitis of both lower extremities 08/14/2020   Microcytic anemia 08/14/2020   GERD without esophagitis 06/10/2020   Atrial fibrillation with rapid ventricular response ([place]) 03/24/2020   Acute on chronic right heart failure ([place]) 01/27/2020   Acute respiratory failure with hypoxia ([place])    Fatty liver disease, nonalcoholic    Shock ([place])    Personal history of DVT (deep vein thrombosis) 12/26/2019   Chronic anticoagulation 12/26/2019   Palpitations 12/26/2019   AF (paroxysmal atrial fibrillation) ([place]) 12/23/2019   Heart failure, systolic, acute ([place]) 13/11/6576   Acute on chronic congestive heart failure (HCC)    Acute on chronic diastolic CHF (congestive heart failure) ([place]) 08/23/2019   Chest pain 08/23/2019   Lactic acidosis 08/23/2019   History of pulmonary embolism 08/12/2019   Atrial tachycardia ([place]) 08/12/2019   Occasional tremors 08/12/2019   OSA (obstructive sleep apnea) 08/12/2019   Acute on chronic right-sided heart failure ([place]) 08/06/2019   Pulmonary embolism ([place]) 06/08/2019   Normocytic anemia 06/08/2019   Pulmonary emboli ([place]) 06/08/2019   Chronic heart failure with preserved ejection fraction ([place]) 05/30/2019   Class 2 severe obesity due to excess calories with serious comorbidity and body mass index (BMI) of 39.0 to 39.9 in adult ([place]) 05/30/2019   Morbid obesity with BMI of 40.0-44.9, adult ([place]) 05/30/2019   Hyponatremia    Drug induced constipation     Peripheral edema    Hypotension due to drugs    Hypoalbuminemia due to protein-calorie malnutrition (HCC)    Hypokalemia    Thrombocytopenia (HCC)    Anemia of chronic disease    Cirrhosis of liver without ascites ([place])    Chronic pain syndrome    Debility 03/13/2019   Portal hypertensive gastropathy ([place]) 03/06/2019   Dyspnea    GIB (gastrointestinal bleeding) 03/04/2019   Mixed hyperlipidemia 01/24/2019   Insomnia 01/24/2019   Hyperlipidemia 01/24/2019   Essential hypertension 01/24/2019   Lumbar disc herniation 01/24/2019    Current Outpatient Medications:    acetaminophen (TYLENOL) 500 MG tablet, Take 1,000 mg by mouth every 6 (six) hours as needed for moderate pain or headache., Disp: , Rfl:    apixaban (ELIQUIS) 5 MG TABS tablet, Take 1 tablet (5 mg total) by mouth 2 (two) times daily., Disp: 60 tablet, Rfl: 0   Buprenorphine HCl-Naloxone HCl 8-2 MG FILM, Place 1 tablet under the tongue in the morning and at bedtime. , Disp: , Rfl:    eplerenone (INSPRA) 50 MG tablet, Take 2 tablets (100 mg total) by mouth daily., Disp: 60 tablet, Rfl: 3   gabapentin (NEURONTIN) 800 MG tablet, Take 1 tablet (800 mg total) by mouth 3 (three) times daily., Disp: 90 tablet, Rfl: 1   metolazone (ZAROXOLYN) 2.5 MG tablet, Take 1 tablet (2.5 mg total) by mouth 3 (three) times a week. Monday, Wednesday and Fridays, Disp: 10 tablet, Rfl: 3   metoprolol succinate (TOPROL XL) 25 MG 24 hr tablet, Take 1 tablet (25 mg total) by mouth  2 (two) times daily., Disp: 60 tablet, Rfl: 2   ondansetron (ZOFRAN-ODT) 4 MG disintegrating tablet, TAKE 1 TABLET BY MOUTH EVERY 8 HOURS AS NEEDED FOR NAUSEA OR VOMITING, Disp: 20 tablet, Rfl: 0   pantoprazole (PROTONIX) 40 MG tablet, Take 40 mg by mouth 2 (two) times daily., Disp: , Rfl:    polyethylene glycol powder (GLYCOLAX/MIRALAX) 17 GM/SCOOP powder, Take 17 g by mouth daily., Disp: 255 g, Rfl: 0   potassium chloride SA (KLOR-CON) 20 MEQ tablet, Take 4 tablets (80 mEq  total) by mouth 2 (two) times daily., Disp: 240 tablet, Rfl: 11   pravastatin (PRAVACHOL) 20 MG tablet, Take 1 tablet (20 mg total) by mouth daily. (Patient taking differently: Take 20 mg by mouth at bedtime.), Disp: 30 tablet, Rfl: 1   sertraline (ZOLOFT) 25 MG tablet, TAKE 1 TABLET BY MOUTH DAILY, Disp: 30 tablet, Rfl: 0   sodium chloride (OCEAN) 0.65 % SOLN nasal spray, Place 1 spray into both nostrils as needed for congestion., Disp: , Rfl:    spironolactone (ALDACTONE) 50 MG tablet, Take 1 tablet (50 mg total) by mouth daily. (Patient not taking: No sig reported), Disp: 30 tablet, Rfl: 0   tamsulosin (FLOMAX) 0.4 MG CAPS capsule, Take 1 capsule (0.4 mg total) by mouth daily. (Patient not taking: Reported on 11/08/2020), Disp: 30 capsule, Rfl: 3   torsemide (DEMADEX) 100 MG tablet, Take 1 tablet (100 mg total) by mouth 2 (two) times daily., Disp: 60 tablet, Rfl: 3   VENTOLIN HFA 108 (90 Base) MCG/ACT inhaler, Inhale 1 puff into the lungs every 4 (four) hours as needed for shortness of breath., Disp: 6.7 g, Rfl: 1   zolpidem (AMBIEN) 10 MG tablet, Take 10 mg by mouth at bedtime., Disp: , Rfl:  Allergies  Allergen Reactions   Penicillins     Broke out in convulsions as a child  Did it involve swelling of the face/tongue/throat, SOB, or low BP? N/A Did it involve sudden or severe rash/hives, skin peeling, or any reaction on the inside of your mouth or nose? N/A Did you need to seek medical attention at a hospital or doctor's office?N/A When did it last happen? N/A If all above answers are "NO", may proceed with cephalosporin use.     Social History   Socioeconomic History   Marital status: Single    Spouse name: Not on file   Number of children: Not on file   Years of education: Not on file   Highest education level: Not on file  Occupational History   Not on file  Tobacco Use   Smoking status: Never   Smokeless tobacco: Never  Vaping Use   Vaping Use: Never used  Substance and  Sexual Activity   Alcohol use: Not Currently   Drug use: Not Currently    Types: Marijuana    Comment: Smoked for 30 years   Sexual activity: Not on file  Other Topics Concern   Not on file  Social History Narrative   Not on file   Social Determinants of Health   Financial Resource Strain: Medium Risk   Difficulty of Paying Living Expenses: Somewhat hard  Food Insecurity: No Food Insecurity   Worried About Running Out of Food in the Last Year: Never true   Ran Out of Food in the Last Year: Never true  Transportation Needs: Unmet Transportation Needs   Lack of Transportation (Medical): Yes   Lack of Transportation (Non-Medical): No  Physical Activity: Not on file  Stress:  Not on file  Social Connections: Not on file  Intimate Partner Violence: Not on file    Physical Exam Vitals reviewed.  Constitutional:      Appearance: Normal appearance. He is normal weight.  HENT:     Head: Normocephalic.     Nose: Nose normal.     Mouth/Throat:     Mouth: Mucous membranes are moist.     Pharynx: Oropharynx is clear.  Eyes:     Conjunctiva/sclera: Conjunctivae normal.     Pupils: Pupils are equal, round, and reactive to light.  Cardiovascular:     Rate and Rhythm: Normal rate and regular rhythm.     Pulses: Normal pulses.     Heart sounds: Normal heart sounds.  Pulmonary:     Effort: Pulmonary effort is normal.     Breath sounds: Normal breath sounds.  Abdominal:     General: Abdomen is flat.     Palpations: Abdomen is soft.  Musculoskeletal:        General: Swelling present. Normal range of motion.     Cervical back: Normal range of motion.     Right lower leg: Edema present.     Left lower leg: Edema present.     Comments: BILATERAL LOWER LEG SWELLING, REDNESS AND PAIN. (IMPROVED SINCE LAST HOME VISIT)  Skin:    General: Skin is warm and dry.     Capillary Refill: Capillary refill takes less than 2 seconds.  Neurological:     General: No focal deficit present.      Mental Status: He is alert. Mental status is at baseline.  Psychiatric:        Mood and Affect: Mood normal.    Arrived for home visit for Aiyden today who was alert and oriented reported to be feeling "sick". He states every morning he wakes up feeling sick to his stomach before taking his morning medicines then a few hours later he says he feels alright. Today he complained of lower leg pain. Currently on antibiotic Levaquin 746m daily for 10 days. He has 7 days left in bottle. He has not yet had his morning medications as of 0900. Anvith denied chest pain, shortness of breath. He did report some dizziness but he says this is normal for him on a daily basis.   Vitals and assessment as noted.   Dr. MAundra Dubinordered 872mIV Lasix today.   IV established, 8027masix given at 0945.   Patient voided urine 20 mins post Lasix dosing.   Boen and I reviewed procedure instructions for Thursday for his CardioMems. I spoke to his son JasCorene Corneao agreed to drop Clint off and pick him up after his procedure.   Rober[facility] accordingly with procedure plans.    Tymeir reports having a new home nurse coming out on Sundays with the company who [employer] sent out.  I left her note of his vitals for today.    I plan to see Bayani in one week.   Home visit complete.   Refills: Flomax Inspra       Future Appointments  Date Time Provider DepLeland/17/2022  8:30 AM [facility] PA/NP [facility] None     ACTION: Home visit completed

## 2020-11-12 NOTE — Telephone Encounter (Signed)
Pt will receive iv lasix today. Pt is scheduled for cardiomems implant Thursday.

## 2020-11-14 ENCOUNTER — Observation Stay (HOSPITAL_COMMUNITY)
Admission: RE | Admit: 2020-11-14 | Discharge: 2020-11-15 | Disposition: A | Discharge status: Home / Self Care | Payer: Medicare HMO | Attending: Cardiology | Admitting: Cardiology

## 2020-11-14 ENCOUNTER — Other Ambulatory Visit: Payer: Self-pay

## 2020-11-14 ENCOUNTER — Encounter (HOSPITAL_COMMUNITY)
Admission: RE | Disposition: A | Discharge status: Home / Self Care | Payer: Self-pay | Source: Home / Self Care | Attending: Cardiology

## 2020-11-14 ENCOUNTER — Encounter (HOSPITAL_COMMUNITY): Payer: Self-pay | Admitting: Cardiology

## 2020-11-14 DIAGNOSIS — I480 Paroxysmal atrial fibrillation: Secondary | ICD-10-CM | POA: Insufficient documentation

## 2020-11-14 DIAGNOSIS — Z79899 Other long term (current) drug therapy: Secondary | ICD-10-CM | POA: Insufficient documentation

## 2020-11-14 DIAGNOSIS — I5033 Acute on chronic diastolic (congestive) heart failure: Secondary | ICD-10-CM

## 2020-11-14 DIAGNOSIS — R5381 Other malaise: Secondary | ICD-10-CM

## 2020-11-14 DIAGNOSIS — Z20822 Contact with and (suspected) exposure to covid-19: Secondary | ICD-10-CM | POA: Diagnosis not present

## 2020-11-14 DIAGNOSIS — I5032 Chronic diastolic (congestive) heart failure: Principal | ICD-10-CM | POA: Insufficient documentation

## 2020-11-14 DIAGNOSIS — Z7901 Long term (current) use of anticoagulants: Secondary | ICD-10-CM | POA: Diagnosis not present

## 2020-11-14 DIAGNOSIS — J449 Chronic obstructive pulmonary disease, unspecified: Secondary | ICD-10-CM | POA: Insufficient documentation

## 2020-11-14 DIAGNOSIS — I2510 Atherosclerotic heart disease of native coronary artery without angina pectoris: Secondary | ICD-10-CM | POA: Insufficient documentation

## 2020-11-14 DIAGNOSIS — I110 Hypertensive heart disease with heart failure: Secondary | ICD-10-CM | POA: Diagnosis not present

## 2020-11-14 HISTORY — PX: RIGHT HEART CATH: CATH118263

## 2020-11-14 HISTORY — PX: PRESSURE SENSOR/CARDIOMEMS: CATH118258

## 2020-11-14 LAB — POCT I-STAT 7, (LYTES, BLD GAS, ICA,H+H)
Acid-Base Excess: 9 mmol/L — ABNORMAL HIGH (ref 0.0–2.0)
Acid-Base Excess: 9 mmol/L — ABNORMAL HIGH (ref 0.0–2.0)
Bicarbonate: 35.4 mmol/L — ABNORMAL HIGH (ref 20.0–28.0)
Bicarbonate: 35.4 mmol/L — ABNORMAL HIGH (ref 20.0–28.0)
Calcium, Ion: 1.19 mmol/L (ref 1.15–1.40)
Calcium, Ion: 1.2 mmol/L (ref 1.15–1.40)
HCT: 37 % — ABNORMAL LOW (ref 39.0–52.0)
HCT: 37 % — ABNORMAL LOW (ref 39.0–52.0)
Hemoglobin: 12.6 g/dL — ABNORMAL LOW (ref 13.0–17.0)
Hemoglobin: 12.6 g/dL — ABNORMAL LOW (ref 13.0–17.0)
O2 Saturation: 63 %
O2 Saturation: 64 %
Potassium: 4 mmol/L (ref 3.5–5.1)
Potassium: 4 mmol/L (ref 3.5–5.1)
Sodium: 139 mmol/L (ref 135–145)
Sodium: 140 mmol/L (ref 135–145)
TCO2: 37 mmol/L — ABNORMAL HIGH (ref 22–32)
TCO2: 37 mmol/L — ABNORMAL HIGH (ref 22–32)
pCO2 arterial: 55.9 mmHg — ABNORMAL HIGH (ref 32.0–48.0)
pCO2 arterial: 55.9 mmHg — ABNORMAL HIGH (ref 32.0–48.0)
pH, Arterial: 7.409 (ref 7.350–7.450)
pH, Arterial: 7.409 (ref 7.350–7.450)
pO2, Arterial: 33 mmHg — CL (ref 83.0–108.0)
pO2, Arterial: 34 mmHg — CL (ref 83.0–108.0)

## 2020-11-14 LAB — BASIC METABOLIC PANEL
Anion gap: 7 (ref 5–15)
BUN: 19 mg/dL (ref 8–23)
CO2: 34 mmol/L — ABNORMAL HIGH (ref 22–32)
Calcium: 9.3 mg/dL (ref 8.9–10.3)
Chloride: 98 mmol/L (ref 98–111)
Creatinine, Ser: 1.13 mg/dL (ref 0.61–1.24)
GFR, Estimated: >60 mL/min (ref >60)
Glucose, Bld: 110 mg/dL — ABNORMAL HIGH (ref 70–99)
Potassium: 4.6 mmol/L (ref 3.5–5.1)
Sodium: 139 mmol/L (ref 135–145)

## 2020-11-14 LAB — CBC
HCT: 38 % — ABNORMAL LOW (ref 39.0–52.0)
Hemoglobin: 11.3 g/dL — ABNORMAL LOW (ref 13.0–17.0)
MCH: 23.7 pg — ABNORMAL LOW (ref 26.0–34.0)
MCHC: 29.7 g/dL — ABNORMAL LOW (ref 30.0–36.0)
MCV: 79.7 fL — ABNORMAL LOW (ref 80.0–100.0)
Platelets: 157 10*3/uL (ref 150–400)
RBC: 4.77 MIL/uL (ref 4.22–5.81)
RDW: 17.2 % — ABNORMAL HIGH (ref 11.5–15.5)
WBC: 8.8 10*3/uL (ref 4.0–10.5)
nRBC: 0 % (ref 0.0–0.2)

## 2020-11-14 LAB — SARS CORONAVIRUS 2 BY RT PCR (HOSPITAL ORDER, PERFORMED IN ~~LOC~~ HOSPITAL LAB): SARS Coronavirus 2: NEGATIVE

## 2020-11-14 SURGERY — PRESSURE SENSOR/CARDIOMEMS
Anesthesia: LOCAL

## 2020-11-14 MED ORDER — DOXYCYCLINE HYCLATE 100 MG PO TABS
100.0000 mg | ORAL_TABLET | Freq: Two times a day (BID) | ORAL | Status: DC
  Administered 2020-11-14 – 2020-11-15 (×2): 100 mg via ORAL
  Filled 2020-11-14 (×3): qty 1

## 2020-11-14 MED ORDER — ACETAMINOPHEN 325 MG PO TABS: 650.0000 mg | ORAL_TABLET | ORAL | Status: DC | PRN

## 2020-11-14 MED ORDER — PANTOPRAZOLE SODIUM 40 MG PO TBEC
40.0000 mg | DELAYED_RELEASE_TABLET | Freq: Two times a day (BID) | ORAL | Status: DC
  Administered 2020-11-14 – 2020-11-15 (×2): 40 mg via ORAL
  Filled 2020-11-14 (×2): qty 1

## 2020-11-14 MED ORDER — ZOLPIDEM TARTRATE 5 MG PO TABS
10.0000 mg | ORAL_TABLET | Freq: Every day | ORAL | Status: DC
  Administered 2020-11-14: 10 mg via ORAL
  Filled 2020-11-14: qty 2

## 2020-11-14 MED ORDER — ASPIRIN 81 MG PO CHEW
81.0000 mg | CHEWABLE_TABLET | Freq: Every day | ORAL | Status: DC
  Administered 2020-11-14 – 2020-11-15 (×2): 81 mg via ORAL
  Filled 2020-11-14 (×2): qty 1

## 2020-11-14 MED ORDER — SERTRALINE HCL 50 MG PO TABS
25.0000 mg | ORAL_TABLET | Freq: Every day | ORAL | Status: DC
  Administered 2020-11-15: 25 mg via ORAL
  Filled 2020-11-14 (×2): qty 1

## 2020-11-14 MED ORDER — APIXABAN 5 MG PO TABS: 5.0000 mg | ORAL_TABLET | Freq: Two times a day (BID) | ORAL | Status: DC

## 2020-11-14 MED ORDER — TORSEMIDE 100 MG PO TABS
100.0000 mg | ORAL_TABLET | Freq: Two times a day (BID) | ORAL | Status: DC
  Administered 2020-11-14 – 2020-11-15 (×3): 100 mg via ORAL
  Filled 2020-11-14 (×3): qty 1

## 2020-11-14 MED ORDER — METOLAZONE 2.5 MG PO TABS
2.5000 mg | ORAL_TABLET | ORAL | Status: DC
  Administered 2020-11-15: 2.5 mg via ORAL
  Filled 2020-11-14: qty 1

## 2020-11-14 MED ORDER — IOHEXOL 350 MG/ML SOLN
INTRAVENOUS | Status: DC | PRN
  Administered 2020-11-14: 10 mL via INTRAVENOUS

## 2020-11-14 MED ORDER — ACETAMINOPHEN 500 MG PO TABS: 1000.0000 mg | ORAL_TABLET | Freq: Four times a day (QID) | ORAL | Status: DC | PRN

## 2020-11-14 MED ORDER — POLYETHYLENE GLYCOL 3350 17 GM/SCOOP PO POWD
17.0000 g | Freq: Every day | ORAL | Status: DC | PRN
  Filled 2020-11-14: qty 255

## 2020-11-14 MED ORDER — BUPRENORPHINE HCL 8 MG SL SUBL
8.0000 mg | SUBLINGUAL_TABLET | Freq: Two times a day (BID) | SUBLINGUAL | Status: DC
  Administered 2020-11-14 – 2020-11-15 (×2): 8 mg via SUBLINGUAL
  Filled 2020-11-14 (×2): qty 1

## 2020-11-14 MED ORDER — SODIUM CHLORIDE 0.9% FLUSH: 3.0000 mL | INTRAVENOUS | Status: DC | PRN

## 2020-11-14 MED ORDER — SPIRONOLACTONE 25 MG PO TABS
50.0000 mg | ORAL_TABLET | Freq: Every day | ORAL | Status: DC
  Administered 2020-11-15: 50 mg via ORAL
  Filled 2020-11-14 (×2): qty 2

## 2020-11-14 MED ORDER — POLYETHYLENE GLYCOL 3350 17 G PO PACK: 17.0000 g | PACK | Freq: Every day | ORAL | Status: DC | PRN

## 2020-11-14 MED ORDER — FENTANYL CITRATE (PF) 100 MCG/2ML IJ SOLN
INTRAMUSCULAR | Status: AC
  Filled 2020-11-14: qty 2

## 2020-11-14 MED ORDER — SALINE SPRAY 0.65 % NA SOLN
1.0000 | NASAL | Status: DC | PRN
  Filled 2020-11-14: qty 44

## 2020-11-14 MED ORDER — SODIUM CHLORIDE 0.9 % IV SOLN: 250.0000 mL | INTRAVENOUS | Status: DC | PRN

## 2020-11-14 MED ORDER — MIDAZOLAM HCL 2 MG/2ML IJ SOLN
INTRAMUSCULAR | Status: DC | PRN
  Administered 2020-11-14 (×2): 1 mg via INTRAVENOUS

## 2020-11-14 MED ORDER — TAMSULOSIN HCL 0.4 MG PO CAPS
0.4000 mg | ORAL_CAPSULE | Freq: Every day | ORAL | Status: DC
  Administered 2020-11-14 – 2020-11-15 (×2): 0.4 mg via ORAL
  Filled 2020-11-14 (×2): qty 1

## 2020-11-14 MED ORDER — LIDOCAINE HCL (PF) 1 % IJ SOLN
INTRAMUSCULAR | Status: AC
  Filled 2020-11-14: qty 30

## 2020-11-14 MED ORDER — HEPARIN (PORCINE) IN NACL 1000-0.9 UT/500ML-% IV SOLN
INTRAVENOUS | Status: AC
  Filled 2020-11-14: qty 500

## 2020-11-14 MED ORDER — ONDANSETRON HCL 4 MG/2ML IJ SOLN: 4.0000 mg | Freq: Four times a day (QID) | INTRAMUSCULAR | Status: DC | PRN

## 2020-11-14 MED ORDER — LIDOCAINE HCL (PF) 1 % IJ SOLN
INTRAMUSCULAR | Status: DC | PRN
  Administered 2020-11-14: 10 mL

## 2020-11-14 MED ORDER — MIDAZOLAM HCL 2 MG/2ML IJ SOLN
INTRAMUSCULAR | Status: AC
  Filled 2020-11-14: qty 2

## 2020-11-14 MED ORDER — PRAVASTATIN SODIUM 10 MG PO TABS
20.0000 mg | ORAL_TABLET | Freq: Every day | ORAL | Status: DC
  Administered 2020-11-14: 20 mg via ORAL
  Filled 2020-11-14: qty 2

## 2020-11-14 MED ORDER — SODIUM CHLORIDE 0.9% FLUSH
3.0000 mL | Freq: Two times a day (BID) | INTRAVENOUS | Status: DC
  Administered 2020-11-15 (×2): 3 mL via INTRAVENOUS

## 2020-11-14 MED ORDER — METOPROLOL SUCCINATE ER 25 MG PO TB24
25.0000 mg | ORAL_TABLET | Freq: Two times a day (BID) | ORAL | Status: DC
  Administered 2020-11-14 – 2020-11-15 (×2): 25 mg via ORAL
  Filled 2020-11-14 (×2): qty 1

## 2020-11-14 MED ORDER — SODIUM CHLORIDE 0.9 % IV SOLN: INTRAVENOUS | Status: DC

## 2020-11-14 MED ORDER — ONDANSETRON 4 MG PO TBDP
4.0000 mg | ORAL_TABLET | Freq: Three times a day (TID) | ORAL | Status: DC | PRN
  Filled 2020-11-14: qty 1

## 2020-11-14 MED ORDER — SODIUM CHLORIDE 0.9% FLUSH
3.0000 mL | Freq: Two times a day (BID) | INTRAVENOUS | Status: DC
  Administered 2020-11-14 – 2020-11-15 (×2): 3 mL via INTRAVENOUS

## 2020-11-14 MED ORDER — FENTANYL CITRATE (PF) 100 MCG/2ML IJ SOLN
INTRAMUSCULAR | Status: DC | PRN
  Administered 2020-11-14: 25 ug via INTRAVENOUS
  Administered 2020-11-14: 50 ug via INTRAVENOUS
  Administered 2020-11-14: 25 ug via INTRAVENOUS

## 2020-11-14 MED ORDER — HEPARIN (PORCINE) IN NACL 1000-0.9 UT/500ML-% IV SOLN
INTRAVENOUS | Status: DC | PRN
  Administered 2020-11-14 (×2): 500 mL

## 2020-11-14 MED ORDER — APIXABAN 5 MG PO TABS
5.0000 mg | ORAL_TABLET | Freq: Two times a day (BID) | ORAL | Status: DC
  Administered 2020-11-15: 5 mg via ORAL
  Filled 2020-11-14: qty 1

## 2020-11-14 MED ORDER — HYDRALAZINE HCL 20 MG/ML IJ SOLN: 10.0000 mg | INTRAMUSCULAR | Status: AC | PRN

## 2020-11-14 MED ORDER — LABETALOL HCL 5 MG/ML IV SOLN: 10.0000 mg | INTRAVENOUS | Status: AC | PRN

## 2020-11-14 MED ORDER — ALBUTEROL SULFATE (2.5 MG/3ML) 0.083% IN NEBU: 3.0000 mL | INHALATION_SOLUTION | Freq: Four times a day (QID) | RESPIRATORY_TRACT | Status: DC | PRN

## 2020-11-14 MED ORDER — POTASSIUM CHLORIDE CRYS ER 20 MEQ PO TBCR
80.0000 meq | EXTENDED_RELEASE_TABLET | Freq: Two times a day (BID) | ORAL | Status: DC
  Administered 2020-11-14 – 2020-11-15 (×2): 80 meq via ORAL
  Filled 2020-11-14 (×2): qty 4

## 2020-11-14 MED ORDER — GABAPENTIN 400 MG PO CAPS
800.0000 mg | ORAL_CAPSULE | Freq: Three times a day (TID) | ORAL | Status: DC
  Administered 2020-11-14 – 2020-11-15 (×3): 800 mg via ORAL
  Filled 2020-11-14 (×3): qty 2

## 2020-11-14 SURGICAL SUPPLY — 13 items
CARDIOMEMS PA SENSOR W/DELIVER (Prosthesis & Implant Heart) ×2 IMPLANT
CATH SWAN GANZ 7F STRAIGHT (CATHETERS) ×2 IMPLANT
GUIDEWIRE .025 260CM (WIRE) ×2 IMPLANT
KIT HEART LEFT (KITS) ×2 IMPLANT
PACK CARDIAC CATHETERIZATION (CUSTOM PROCEDURE TRAY) ×2 IMPLANT
PROTECTION STATION PRESSURIZED (MISCELLANEOUS) ×2
SENSOR CARDIOMEMS PA W/DELIVER (Prosthesis & Implant Heart) ×1 IMPLANT
SHEATH FAST CATH 12F 12CM (SHEATH) ×2 IMPLANT
SHEATH PINNACLE 7F 10CM (SHEATH) ×2 IMPLANT
SHEATH PINNACLE 9F 10CM (SHEATH) ×2 IMPLANT
STATION PROTECTION PRESSURIZED (MISCELLANEOUS) ×1 IMPLANT
TRANSDUCER W/STOPCOCK (MISCELLANEOUS) ×2 IMPLANT
WIRE NITREX .018X300 STIFF (WIRE) ×2 IMPLANT

## 2020-11-14 NOTE — Progress Notes (Signed)
Pt states his house keeper has had COVID within the past week. Dr. [name] called and informed.

## 2020-11-14 NOTE — H&P (Addendum)
Advanced Heart Failure Team History and Physical Note   PCP:  Enid Skeens., MD  PCP-Cardiology: Berniece Salines, DO    HF: Dr. [name]  Reason for Admission: Chronic diastolic CHF, CardioMEMS placement   HPI:   65 y.o. obese male, nonsmoker w/ mild COPD, cirrhosis, DVT/PE 05/2019 treated w/ Eliquis, chronic diastolic HF w/ suspected RV dysfunction.  No obstructive CAD on cardiac cath in January 2022.   Echo 4/21 showed normal LVEF 55-60% w/ G1DD. RV not well visualized, but felt to have component of RV failure.    Admitted at least 7 times for a/c CHF in 2021-2022. Previously declined CardioMEMS placement.  Most recent admission for a/C CHF 7/22 after presenting with LE swelling and complaints of dizziness with fall.  He had failed outpatient IV diuresis. He was diuresed with IV lasix + metolazone. He underwent RHC showing optimized filling pressures and preserved CO/CI. Eventually agreed to [facility]. He was discharged on torsemide 100 mg bid + metolazone M/W/F. Discharge weight 374 lbs.  He was hospitalized in May 2022 with possible BLE cellulitis after failing outpatient treatment. Treated with vancomycin and oritavancin. Seen by ID. It was not certain if he actually had cellulitis.  Seen for clinic f/u on 07/29. Weight 365 lb. Appeared compensated. CardioMEMS approved and placement scheduled for today.  Feeling okay. No significant dyspnea. Home weight between 361-365#. Reports his PCP recently prescribed Levofloxacin for cellulitis of lower extremities. Notes only slight improvement. Feet are erythematous, lower extremities are warm and tender to palpation. Increased swelling. Has not been wearing compression stockings. Wound present on lateral right great toe preceding recent admission.   Review of Systems: [y] = yes, [ ]  = no   General: Weight gain [ ] ; Weight loss [ ] ; Anorexia [ ] ; Fatigue [ ] ; Fever [ ] ; Chills [ ] ; Weakness [ ]   Cardiac: Chest pain/pressure [ ] ; Resting  SOB [ ] ; Exertional SOB [ ] ; Orthopnea [ ] ; Pedal Edema [Y]; Palpitations [ ] ; Syncope [ ] ; Presyncope [ ] ; Paroxysmal nocturnal dyspnea[ ]   Pulmonary: Cough [ ] ; Wheezing[ ] ; Hemoptysis[ ] ; Sputum [ ] ; Snoring [ ]   GI: Vomiting[ ] ; Dysphagia[ ] ; Melena[ ] ; Hematochezia [ ] ; Heartburn[ ] ; Abdominal pain [ ] ; Constipation [ ] ; Diarrhea [ ] ; BRBPR [ ]   GU: Hematuria[ ] ; Dysuria [ ] ; Nocturia[ ]   Vascular: Pain in legs with walking [Y]; Pain in feet with lying flat [ ] ; Non-healing sores [Y]; Stroke [ ] ; TIA [ ] ; Slurred speech [ ] ;  Neuro: Headaches[ ] ; Vertigo[ ] ; Seizures[ ] ; Paresthesias[ ] ;Blurred vision [ ] ; Diplopia [ ] ; Vision changes [ ]   Ortho/Skin: Arthritis [ ] ; Joint pain [ ] ; Muscle pain [ ] ; Joint swelling [ ] ; Back Pain [ ] ; Rash [ ]   Psych: Depression[Y]; Anxiety[ ]   Heme: Bleeding problems [ ] ; Clotting disorders [ ] ; Anemia [ ]   Endocrine: Diabetes [ ] ; Thyroid dysfunction[ ]    Home Medications  No current facility-administered medications on file prior to encounter.   Current Outpatient Medications on File Prior to Encounter  Medication Sig Dispense Refill   acetaminophen (TYLENOL) 500 MG tablet Take 1,000 mg by mouth every 6 (six) hours as needed for moderate pain or headache.     apixaban (ELIQUIS) 5 MG TABS tablet Take 1 tablet (5 mg total) by mouth 2 (two) times daily. 60 tablet 0   Buprenorphine HCl-Naloxone HCl 8-2 MG FILM Place 1 tablet under the tongue 2 (two) times daily.  gabapentin (NEURONTIN) 800 MG tablet Take 1 tablet (800 mg total) by mouth 3 (three) times daily. 90 tablet 1   levofloxacin (LEVAQUIN) 750 MG tablet Take 750 mg by mouth daily.     metolazone (ZAROXOLYN) 2.5 MG tablet Take 1 tablet (2.5 mg total) by mouth 3 (three) times a week. Monday, Wednesday and Fridays (Patient taking differently: Take 2.5 mg by mouth every Monday, Wednesday, and Friday.) 10 tablet 3   metoprolol succinate (TOPROL XL) 25 MG 24 hr tablet Take 1 tablet (25 mg total) by  mouth 2 (two) times daily. 60 tablet 2   ondansetron (ZOFRAN-ODT) 4 MG disintegrating tablet TAKE 1 TABLET BY MOUTH EVERY 8 HOURS AS NEEDED FOR NAUSEA OR VOMITING (Patient taking differently: Take 4 mg by mouth every 8 (eight) hours as needed for vomiting or nausea.) 20 tablet 0   pantoprazole (PROTONIX) 40 MG tablet Take 40 mg by mouth 2 (two) times daily.     polyethylene glycol powder (GLYCOLAX/MIRALAX) 17 GM/SCOOP powder Take 17 g by mouth daily. (Patient taking differently: Take 17 g by mouth daily as needed for mild constipation or moderate constipation.) 255 g 0   potassium chloride SA (KLOR-CON) 20 MEQ tablet Take 4 tablets (80 mEq total) by mouth 2 (two) times daily. 240 tablet 11   pravastatin (PRAVACHOL) 20 MG tablet Take 1 tablet (20 mg total) by mouth daily. (Patient taking differently: Take 20 mg by mouth at bedtime.) 30 tablet 1   sertraline (ZOLOFT) 25 MG tablet TAKE 1 TABLET BY MOUTH DAILY (Patient taking differently: Take 25 mg by mouth daily.) 30 tablet 0   tamsulosin (FLOMAX) 0.4 MG CAPS capsule Take 1 capsule (0.4 mg total) by mouth daily. 30 capsule 3   torsemide (DEMADEX) 100 MG tablet Take 1 tablet (100 mg total) by mouth 2 (two) times daily. 60 tablet 3   zolpidem (AMBIEN) 10 MG tablet Take 10 mg by mouth at bedtime.     sodium chloride (OCEAN) 0.65 % SOLN nasal spray Place 1 spray into both nostrils as needed for congestion.     VENTOLIN HFA 108 (90 Base) MCG/ACT inhaler Inhale 1 puff into the lungs every 4 (four) hours as needed for shortness of breath. (Patient taking differently: Inhale 1 puff into the lungs every 6 (six) hours as needed for shortness of breath or wheezing.) 6.7 g 1   [DISCONTINUED] bumetanide (BUMEX) 1 MG tablet Take 4 tablets (4 mg total) by mouth daily with breakfast AND 3 tablets (3 mg total) every evening. 180 tablet 3   [DISCONTINUED] metoprolol tartrate (LOPRESSOR) 50 MG tablet Take 1 tablet (50 mg total) by mouth 2 (two) times daily. 180 tablet 1       Past Medical History: Past Medical History:  Diagnosis Date   Acute on chronic congestive heart failure (HCC)    Acute on chronic diastolic CHF (congestive heart failure) ([place]) 08/23/2019   Acute on chronic right-sided heart failure ([place]) 08/06/2019   Acute respiratory failure with hypoxia (HCC)    Anemia of chronic disease    Atrial fibrillation ([place]) 12/23/2019   Atrial tachycardia ([place]) 08/12/2019   Benign essential HTN    Chest pain 08/23/2019   Chronic anticoagulation 12/26/2019   Chronic heart failure with preserved EF 05/30/2019   Echo 07/2019: EF 55-60, GR 1 DD   Chronic pain syndrome    Cirrhosis of liver without ascites (HCC)    Class 2 severe obesity due to excess calories with serious comorbidity and body mass index (  BMI) of 39.0 to 39.9 in adult [facility]) 05/30/2019   Debility 03/13/2019   Drug induced constipation    Dyspnea    Fatty liver disease, nonalcoholic    GIB (gastrointestinal bleeding) 03/04/2019   Heart failure, systolic, acute ([place]) 07/21/7351   History of pulmonary embolism 08/12/2019   Hyperlipidemia 01/24/2019   Hypertension 01/24/2019   Hypoalbuminemia due to protein-calorie malnutrition (HCC)    Hypokalemia    Hyponatremia    Hypotension due to drugs    Insomnia 01/24/2019   Lactic acidosis 08/23/2019   Lumbar disc herniation 01/24/2019   Mixed hyperlipidemia 01/24/2019   Morbid obesity with BMI of 40.0-44.9, adult ([place]) 05/30/2019   Normocytic anemia 06/08/2019   Occasional tremors 08/12/2019   OSA (obstructive sleep apnea) 08/12/2019   Palpitations 12/26/2019   Peripheral edema    Personal history of DVT (deep vein thrombosis) 12/26/2019   Portal hypertensive gastropathy ([place]) 03/06/2019   Seen on EGD 03/06/2019   Pulmonary emboli ([place]) 06/08/2019   Pulmonary embolism ([place]) 06/08/2019   Redness and swelling of lower leg 08/23/2019   Shock ([place])    Thrombocytopenia ([place])     Past Surgical History: Past Surgical History:  Procedure Laterality Date    ESOPHAGOGASTRODUODENOSCOPY (EGD) WITH PROPOFOL N/A 03/05/2019   Procedure: ESOPHAGOGASTRODUODENOSCOPY (EGD) WITH PROPOFOL;  Surgeon: Doran Stabler, MD;  Location: [place];  Service: Gastroenterology;  Laterality: N/A;  Large Blood Clot Removed   ESOPHAGOGASTRODUODENOSCOPY (EGD) WITH PROPOFOL N/A 03/06/2019   Procedure: ESOPHAGOGASTRODUODENOSCOPY (EGD) WITH PROPOFOL;  Surgeon: Thornton Park, MD;  Location: [place];  Service: Gastroenterology;  Laterality: N/A;   HEMORROIDECTOMY     RIGHT HEART CATH N/A 08/09/2019   Procedure: RIGHT HEART CATH;  Surgeon: Larey Dresser, MD;  Location: [place] CV LAB;  Service: Cardiovascular;  Laterality: N/A;   RIGHT HEART CATH N/A 11/01/2020   Procedure: RIGHT HEART CATH;  Surgeon: Larey Dresser, MD;  Location: [place] [facility];  Service: Cardiovascular;  Laterality: N/A;   RIGHT/LEFT HEART CATH AND CORONARY ANGIOGRAPHY N/A 05/10/2020   Procedure: RIGHT/LEFT HEART CATH AND CORONARY ANGIOGRAPHY;  Surgeon: Larey Dresser, MD;  Location: [place] CV [facility];  Service: Cardiovascular;  Laterality: N/A;    Family History:  Family History  Problem Relation Age of Onset   Hyperlipidemia Mother    Hypertension Mother    Stroke Mother    CAD Maternal Grandmother    CAD Maternal Grandfather     Social History: Social History   Socioeconomic History   Marital status: Single    Spouse name: Not on file   Number of children: Not on file   Years of education: Not on file   Highest education level: Not on file  Occupational History   Not on file  Tobacco Use   Smoking status: Never   Smokeless tobacco: Never  Vaping Use   Vaping Use: Never used  Substance and Sexual Activity   Alcohol use: Not Currently   Drug use: Not Currently    Types: Marijuana    Comment: Smoked for 30 years   Sexual activity: Not on file  Other Topics Concern   Not on file  Social History Narrative   Not on file   Social Determinants of Health    Financial Resource Strain: Medium Risk   Difficulty of Paying Living Expenses: Somewhat hard  Food Insecurity: No Food Insecurity   Worried About Running Out of Food in the Last Year: Never true   Ran Out  of Food in the Last Year: Never true  Transportation Needs: Unmet Transportation Needs   Lack of Transportation (Medical): Yes   Lack of Transportation (Non-Medical): No  Physical Activity: Not on file  Stress: Not on file  Social Connections: Not on file    Allergies:  Allergies  Allergen Reactions   Penicillins     Broke out in convulsions as a child  Did it involve swelling of the face/tongue/throat, SOB, or low BP? N/A Did it involve sudden or severe rash/hives, skin peeling, or any reaction on the inside of your mouth or nose? N/A Did you need to seek medical attention at a hospital or doctor's office?N/A When did it last happen? N/A If all above answers are "NO", may proceed with cephalosporin use.    Objective:    Vital Signs:   Temp:  [98.3 F (36.8 C)] 98.3 F (36.8 C) (08/04 1312) Pulse Rate:  [70] 70 (08/04 1312) BP: (101)/(69) 101/69 (08/04 1312) SpO2:  [95 %] 95 % (08/04 1312) Weight:  [165.1 kg] 165.1 kg (08/04 1312)   Filed Weights   11/14/20 1312  Weight: (!) 165.1 kg     Physical Exam     General:  No acute distress. HEENT: Normal Neck: Supple. no JVD. Carotids 2+ bilat; no bruits. No lymphadenopathy or thyromegaly appreciated. Cor: PMI nondisplaced. Regular rate & rhythm. No rubs, gallops or murmurs. Lungs: Clear Abdomen: Soft, nontender, nondistended. No hepatosplenomegaly. No bruits or masses. Good bowel sounds. Extremities: No cyanosis, clubbing, 1-2 + bilateral lower extremity edema with chronic venous stasis changes. Feet and anterior shins are erythematous and warm. Small sore on lateral aspect of right great toe. Difficult to palpate pedal pulses on right d/t edema. DP and PT doppler signals easily detected. Neuro: Alert & oriented x 3,  cranial nerves grossly intact. moves all 4 extremities w/o difficulty. Affect pleasant.   Telemetry   N/a  EKG   Last ECG 07/29 with normal sinus rhythm  Labs     Basic Metabolic Panel: Recent Labs  Lab 11/08/20 1241  NA 136  K 3.5  CL 89*  CO2 36*  GLUCOSE 111*  BUN 16  CREATININE 1.12  CALCIUM 9.4    Liver Function Tests: No results for input(s): AST, ALT, ALKPHOS, BILITOT, PROT, ALBUMIN in the last 168 hours. No results for input(s): LIPASE, AMYLASE in the last 168 hours. No results for input(s): AMMONIA in the last 168 hours.  CBC: Recent Labs  Lab 11/08/20 1241  WBC 10.3  HGB 11.6*  HCT 38.9*  MCV 79.2*  PLT 162    Cardiac Enzymes: No results for input(s): CKTOTAL, CKMB, CKMBINDEX, TROPONINI in the last 168 hours.  BNP: BNP (last 3 results) Recent Labs    10/12/20 0615 10/17/20 1050 10/28/20 1338  BNP 33.4 49.9 28.1    ProBNP (last 3 results) No results for input(s): PROBNP in the last 8760 hours.   CBG: No results for input(s): GLUCAP in the last 168 hours.  Coagulation Studies: No results for input(s): LABPROT, INR in the last 72 hours.  Imaging: No results found.   Assessment/Plan  1. Chronic diastolic CHF with suspected RV dysfunction: -Echo during admission July 2022 with EF 60-65%, mild LVH, normal RV, unable to estimate PA systolic pressure, IVC not visualized.  Multiple CHF admissions despite aggressive outpatient diuretic regimen and paramedicine.  RHC in July with optimized filling pressures, PCWP 13, CO/CI 7.3/2.4.  -Undergoing CardioMEMS placement today in order to more effectively manage CHF -NYHA  III -Appears compensated. Home weight stable. Suspect much of his edema is due to venous stasis. Will order Unna boots. Resume torsemide post CardioMEMS. BMP today. -AT home: torsemide 100 mg bid + metolazone 2.5 mg on M/W/F + extra 40 KCl on metolazone days.  -Continue spironolactone 50 mg daily -He is off Jardiance (GI  upset). 2. H/o PE/DVT:  -Suspect due to sedentary lifestyle.  Occurred in 2/21.  V/Q scan in 6/21 did not show evidence for chronic PE.   -Continue apixaban. No bleeding issues.  3. OSA:  -Cannot tolerate CPAP. 4. Anxiety/depression:  -This is a major issue.   -Continue sertraline 25 mg daily. 5. CAD:  -Cath 1/22 with no significant CAD. 6. Hypertension:  -Stable. -Continue Toprol XL 25 mg bid.   7. Atrial fibrillation:  -Paroxysmal.  -Continue apixaban. -Continue Toprol XL. 8. Lower extremity cellulitis: -Admitted in May with possible BLE cellulitis.  -Reports recent outpatient treatment for recurrent cellulitis with Levaquin. Notes minimal improvement in erythema, pain and swelling. -Consult with our ID pharmacist - will treat with doxycycline 100 mg BID X 5 days -Suspect significant venous stasis probably contributing to some of his discomfort. Not wearing compression stockings regularly. Will order Unna boots. -CBC today 9. Right great toe wound: -Present for at least a few weeks. Noted after trauma from a fall. -Will consult wound care  Disposition: Anticipate discharge home tomorrow   [facility], Lynder Parents, PA-C 11/14/2020, 3:11 PM  Advanced Heart Failure Team Pager [phone] (M-F; 7a - 5p)  Please contact [facility] for night-coverage after hours (4p -7a ) and weekends on [email or web address]   Patient seen with PA, agree with the above note.   Mr Curfman had successful Cardiomems pressure sensor placement today.  RHC as below:  RHC Procedural Findings: Hemodynamics (mmHg) RA mean 6 RV 29/11 PA 27/15, mean 20 PCWP mean 9  Oxygen saturations: PA 64% AO 96% Cardiac Output (Fick) 7.93  Cardiac Index (Fick) 2.63  He does not have anyone to stay with him tonight, so we will be admitting him overnight.  Patient also has had bilateral leg redness concerning for cellulitis that has responded poorly to levofloxacin.   General: NAD Neck: No JVD, no thyromegaly or thyroid  nodule.  Lungs: Clear to auscultation bilaterally with normal respiratory effort. CV: Nondisplaced PMI.  Heart regular S1/S2, no S3/S4, no murmur.  1+ chronic edema to knees.  No carotid bruit.  Dopplerable pedal pulses.  Abdomen: Soft, nontender, no hepatosplenomegaly, no distention.  Skin: Intact without lesions or rashes.  Neurologic: Alert and oriented x 3.  Psych: Normal affect. Extremities: Lower legs erythematous, wound on right foot.  HEENT: Normal.   Patient appears well-compensated in terms of CHF.  Continue current regimen.   He has been on apixaban for VTE, will restart tomorrow morning.  Will add ASA 81 mg daily for 1 month with Cardiomems placement.    Will switch from levofloxacin to doxycycline x 5 more days for cellulitis and will get wound consult for foot.  His pulses are dopplerable.    Loralie Champagne 11/14/2020 4:21 PM

## 2020-11-14 NOTE — Interval H&P Note (Signed)
History and Physical Interval Note:  11/14/2020 3:18 PM  Charles Gray  has presented today for surgery, with the diagnosis of chf.  The various methods of treatment have been discussed with the patient and family. After consideration of risks, benefits and other options for treatment, the patient has consented to  Procedure(s): PRESSURE SENSOR/CARDIOMEMS (N/A) RIGHT HEART CATH (N/A) as a surgical intervention.  The patient's history has been reviewed, patient examined, no change in status, stable for surgery.  I have reviewed the patient's chart and labs.  Questions were answered to the patient's satisfaction.     Erandi Lemma [employer]

## 2020-11-14 NOTE — Progress Notes (Signed)
14 fr venous sheath aspirated and removed from right femoral vein. Manual pressure applied for 20 minutes, site rebled. Manual pressure applied for additional 15 minutes.   Tegaderm dressing applied, bedrest instructions given.   Right dp and pt pulses present with doppler.  Bedrest begins at 17:05:00

## 2020-11-15 ENCOUNTER — Other Ambulatory Visit (HOSPITAL_COMMUNITY): Payer: Self-pay

## 2020-11-15 ENCOUNTER — Encounter (HOSPITAL_COMMUNITY): Payer: Self-pay | Admitting: Cardiology

## 2020-11-15 DIAGNOSIS — I5032 Chronic diastolic (congestive) heart failure: Secondary | ICD-10-CM | POA: Diagnosis not present

## 2020-11-15 LAB — CBC
HCT: 35.4 % — ABNORMAL LOW (ref 39.0–52.0)
Hemoglobin: 10.7 g/dL — ABNORMAL LOW (ref 13.0–17.0)
MCH: 24 pg — ABNORMAL LOW (ref 26.0–34.0)
MCHC: 30.2 g/dL (ref 30.0–36.0)
MCV: 79.4 fL — ABNORMAL LOW (ref 80.0–100.0)
Platelets: 139 10*3/uL — ABNORMAL LOW (ref 150–400)
RBC: 4.46 MIL/uL (ref 4.22–5.81)
RDW: 17.2 % — ABNORMAL HIGH (ref 11.5–15.5)
WBC: 7.1 10*3/uL (ref 4.0–10.5)
nRBC: 0 % (ref 0.0–0.2)

## 2020-11-15 LAB — BASIC METABOLIC PANEL
Anion gap: 9 (ref 5–15)
Anion gap: 11 (ref 5–15)
BUN: 17 mg/dL (ref 8–23)
BUN: 17 mg/dL (ref 8–23)
CO2: 33 mmol/L — ABNORMAL HIGH (ref 22–32)
CO2: 32 mmol/L (ref 22–32)
Calcium: 8.7 mg/dL — ABNORMAL LOW (ref 8.9–10.3)
Calcium: 9.1 mg/dL (ref 8.9–10.3)
Chloride: 95 mmol/L — ABNORMAL LOW (ref 98–111)
Chloride: 94 mmol/L — ABNORMAL LOW (ref 98–111)
Creatinine, Ser: 1.04 mg/dL (ref 0.61–1.24)
Creatinine, Ser: 1.09 mg/dL (ref 0.61–1.24)
GFR, Estimated: >60 mL/min (ref >60)
GFR, Estimated: >60 mL/min (ref >60)
Glucose, Bld: 115 mg/dL — ABNORMAL HIGH (ref 70–99)
Glucose, Bld: 125 mg/dL — ABNORMAL HIGH (ref 70–99)
Potassium: 3.3 mmol/L — ABNORMAL LOW (ref 3.5–5.1)
Potassium: 3.7 mmol/L (ref 3.5–5.1)
Sodium: 137 mmol/L (ref 135–145)
Sodium: 137 mmol/L (ref 135–145)

## 2020-11-15 MED ORDER — DOXYCYCLINE HYCLATE 100 MG PO TABS
100.0000 mg | ORAL_TABLET | Freq: Two times a day (BID) | ORAL | 0 refills | Status: DC
  Filled 2020-11-15: qty 8, 4d supply, fill #0

## 2020-11-15 MED ORDER — PRAVASTATIN SODIUM 20 MG PO TABS
20.0000 mg | ORAL_TABLET | Freq: Every day | ORAL | 1 refills | Status: DC
  Filled 2020-11-15: qty 30, 30d supply, fill #0

## 2020-11-15 MED ORDER — ASPIRIN 81 MG PO CHEW: 81.0000 mg | CHEWABLE_TABLET | Freq: Every day | ORAL | 0 refills | Status: DC

## 2020-11-15 MED ORDER — POTASSIUM CHLORIDE CRYS ER 20 MEQ PO TBCR
80.0000 meq | EXTENDED_RELEASE_TABLET | Freq: Two times a day (BID) | ORAL | 0 refills | Status: DC
Start: 2020-11-15 — End: 2021-03-05
  Filled 2020-11-15: qty 260, 33d supply, fill #0

## 2020-11-15 MED ORDER — TAMSULOSIN HCL 0.4 MG PO CAPS
0.4000 mg | ORAL_CAPSULE | Freq: Every day | ORAL | 0 refills | Status: DC
  Filled 2020-11-15: qty 30, 30d supply, fill #0

## 2020-11-15 MED ORDER — POTASSIUM CHLORIDE CRYS ER 20 MEQ PO TBCR: 40.0000 meq | EXTENDED_RELEASE_TABLET | Freq: Once | ORAL | Status: DC

## 2020-11-15 MED ORDER — POTASSIUM CHLORIDE CRYS ER 20 MEQ PO TBCR
80.0000 meq | EXTENDED_RELEASE_TABLET | Freq: Once | ORAL | Status: AC
  Administered 2020-11-15: 80 meq via ORAL
  Filled 2020-11-15: qty 4

## 2020-11-15 MED ORDER — APIXABAN 5 MG PO TABS: 5.0000 mg | ORAL_TABLET | Freq: Two times a day (BID) | ORAL | 0 refills | Status: DC

## 2020-11-15 MED ORDER — METOLAZONE 2.5 MG PO TABS
2.5000 mg | ORAL_TABLET | ORAL | 0 refills | Status: DC
  Filled 2020-11-15: qty 14, 33d supply, fill #0

## 2020-11-15 NOTE — Care Management Obs Status (Signed)
[place] NOTIFICATION   Patient Details  Name: Charles Gray MRN: [id number] Date of Birth: 08-Jul-1955   Medicare Observation Status Notification Given:  Yes    Zenon Mayo, RN 11/15/2020, 2:28 PM

## 2020-11-15 NOTE — TOC Transition Note (Signed)
Transition of Care [facility]) - CM/SW Discharge Note   Patient Details  Name: Charles Gray MRN: [id number] Date of Birth: 10/11/1955  Transition of Care [facility]) CM/SW Contact:  Zenon Mayo, RN Phone Number: 11/15/2020, 10:29 AM   Clinical Narrative:    NCM spoke with patient, he is active with [facility] [place], and he would like to continue with their services.  NCM confirmed with Stacie.     Final next level of care: [place] Barriers to Discharge: No Barriers Identified   Patient Goals and CMS Choice Patient states their goals for this hospitalization and ongoing recovery are:: return home CMS Medicare.gov Compare Post Acute Care list provided to:: Patient Choice offered to / list presented to : Patient  Discharge Placement                       Discharge Plan and Services                  DME Agency: NA       HH Arranged: PT HH Agency: [place] Date [place]: 11/15/20 Time [place]: 0737 Representative spoke with at [place]: [place] ([place]) Interventions     Readmission Risk Interventions Readmission Risk Prevention Plan 09/26/2020 06/12/2020 03/27/2020  Transportation Screening Complete Complete Complete  HRI or [place] for [place] Planning/Counseling - - -  Medication Review Press photographer) Complete Complete Complete  PCP or Specialist appointment within 3-5 days of discharge Complete - Complete  HRI or [place] Complete Complete Complete  SW Recovery Care/Counseling Consult Complete Complete Complete  Palliative Care Screening Not Applicable Not Applicable Not [place] Not Applicable Not Applicable Not Applicable

## 2020-11-15 NOTE — Progress Notes (Signed)
Ortho tech called to apply ulna boots for patient.  Ortho tech stated he would be over shortly to apply ulna boots.

## 2020-11-15 NOTE — TOC Transition Note (Addendum)
Transition of Care [facility]) - CM/SW Discharge Note Heart Failure   Patient Details  Name: Charles Gray MRN: [id number] Date of Birth: Aug 20, 1955  [facility]) CM/SW Contact:  [place], [place] Phone Number: 11/15/2020, 10:37 AM   Clinical Narrative:    CSW spoke with Mr. Wich at bedside to discuss about transportation home today and [facility] for ADL's. Raydel reported that he is hoping his son will take him home from the hospital today but is aware to contact the CSW if he needs transportation home. Mr. Stranahan reported that he is trying to get the tamsulosino .40m filled but his PCP wouldn't fill the prescription and told Jalien that however prescribed it needed to refill it. CSW sent a message to LDelman Cheadlewith cardiology regarding the medication. RNCM to [facility] for assistance with ADL's. Mr. TBrammerspoke with the CSW about antibiotics and unna boots. CSW sent a message to the [facility]headlewith cardi[facility] regarding these concerns. CSW provided Mr. TPostlewith an appointment card [facility] and encouraged him to follow up and to attend the appointment and bring his medications and if anything changes to please reach out so that CSW/HV clinic team can provide support.    Final next level of care: [place]charge: No Barriers Identified   Patient Goals and CMS Choice Patient states their goals for this hospitalization and ongoing recovery are:: return home CMS Medicare.gov Compare Post Acute Care list provided to:: Patient Choice offered to / list presented to : Patient  Discharge Placement                       Discharge Plan and Services                  DME Agency: NA       HH Arranged: PT HH Agency: [place] 11/15/20 Time [place] [id number] spoke with at HWallace SSanders(SDerma  Interventions Food Insecurity Interventions: Intervention Not Indicated (Pt receives FPhysicist, medical Financial Strain Interventions: Intervention Not Indicated Housing Interventions: Intervention Not Indicated Transportation Interventions: [facility]   Readmission Risk Interventions Readmission Risk Prevention Plan 09/26/2020 06/12/2020 03/27/2020  Transportation Screening Complete Complete Complete  HRI or HRisingsunWork Consult for [place]anning/Counseling - - -  Medication Review (Press photographer Complete Complete Complete  PCP or Specialist appointment within 3-5 days of discharge Complete - Complete  HRI or [place]wayComplete Complete Complete  SW Recovery Care/Counseling Consult Complete Complete Complete  Palliative Care Screening Not Applicable Not Applicable Not [place]arrisvilleNot Applicable Not Applicable Not Applicable    Ahtziry Saathoff, MSW, LAlsenHeart Failure Social Worker

## 2020-11-15 NOTE — Progress Notes (Addendum)
Advanced Heart Failure Rounding Note  PCP-Cardiologist: Berniece Salines, DO  HF clinic: Dr. [name]  Subjective:     No significant dyspnea.    Switched levofloxacin to doxycycline for suspected cellulitis. Reports BLE are still tender. No fever or leukocytosis. Unna boots have not been applied    RHC/CardioMEMS placement: Hemodynamics (mmHg) RA mean 6 RV 29/11 PA 27/15, mean 20 PCWP mean 9   Oxygen saturations: PA 64% AO 96%  Cardiac Output (Fick) 7.93  Cardiac Index (Fick) 2.63  Objective:   Weight Range: (!) 166 kg Body mass index is 38.27 kg/m.   Vital Signs:   Temp:  [97.6 F (36.4 C)-98.4 F (36.9 C)] 97.6 F (36.4 C) (08/05 0736) Pulse Rate:  [0-96] 61 (08/05 0736) Resp:  [0-23] 16 (08/05 0736) BP: (101-148)/(53-98) 113/66 (08/05 0736) SpO2:  [95 %-100 %] 97 % (08/05 0736) Weight:  [165.1 kg-166 kg] 166 kg (08/05 0003) Last BM Date: 11/13/20  Weight change: Filed Weights   11/14/20 1312 11/14/20 1922 11/15/20 0003  Weight: (!) 165.1 kg (!) 165.1 kg (!) 166 kg    Intake/Output:   Intake/Output Summary (Last 24 hours) at 11/15/2020 0829 Last data filed at 11/15/2020 0438 Gross per 24 hour  Intake 1200 ml  Output 2650 ml  Net -1450 ml      Physical Exam    General:  Well appearing. No resp difficulty HEENT: Normal Neck: Supple. JVP difficult to assess d/t thick neck. Carotids 2+ bilat; no bruits. No lymphadenopathy or thyromegaly appreciated. Cor: PMI nondisplaced. Regular rate & rhythm. No rubs, gallops or murmurs. Lungs: Clear Abdomen: Soft, nontender, nondistended. No hepatosplenomegaly. No bruits or masses. Good bowel sounds. Extremities: No cyanosis, clubbing, chronic venous stasis changes b/l lower extremities. Right groin site with scant oozing, stable. Erythema and warmth noted to both feet. Dressing over right great toe wound. 1+ b/l LE edema.  Neuro: Alert & orientedx3, cranial nerves grossly intact. moves all 4 extremities w/o  difficulty. Affect pleasant   Telemetry   NSR 60s (personally reviewed)  EKG    N/a  Labs    CBC Recent Labs    11/14/20 1735 11/15/20 0348  WBC 8.8 7.1  HGB 11.3* 10.7*  HCT 38.0* 35.4*  MCV 79.7* 79.4*  PLT 157 629*   Basic Metabolic Panel Recent Labs    11/14/20 1735 11/15/20 0348  NA 139 137  K 4.6 3.3*  CL 98 95*  CO2 34* 33*  GLUCOSE 110* 115*  BUN 19 17  CREATININE 1.13 1.04  CALCIUM 9.3 8.7*   Liver Function Tests No results for input(s): AST, ALT, ALKPHOS, BILITOT, PROT, ALBUMIN in the last 72 hours. No results for input(s): LIPASE, AMYLASE in the last 72 hours. Cardiac Enzymes No results for input(s): CKTOTAL, CKMB, CKMBINDEX, TROPONINI in the last 72 hours.  BNP: BNP (last 3 results) Recent Labs    10/12/20 0615 10/17/20 1050 10/28/20 1338  BNP 33.4 49.9 28.1    ProBNP (last 3 results) No results for input(s): PROBNP in the last 8760 hours.   D-Dimer No results for input(s): DDIMER in the last 72 hours. Hemoglobin A1C No results for input(s): HGBA1C in the last 72 hours. Fasting Lipid Panel No results for input(s): CHOL, HDL, LDLCALC, TRIG, CHOLHDL, LDLDIRECT in the last 72 hours. Thyroid Function Tests No results for input(s): TSH, T4TOTAL, T3FREE, THYROIDAB in the last 72 hours.  Invalid input(s): FREET3  Other results:   Imaging    CARDIAC CATHETERIZATION  Result Date:  11/14/2020 1. Normal filling pressures. 2. Successful Cardiomems pressure sensor placement.     Medications:     Scheduled Medications:  apixaban  5 mg Oral BID   aspirin  81 mg Oral Daily   buprenorphine  8 mg Sublingual BID   doxycycline  100 mg Oral Q12H   gabapentin  800 mg Oral TID   metolazone  2.5 mg Oral Q M,W,F   metoprolol succinate  25 mg Oral BID   pantoprazole  40 mg Oral BID   potassium chloride SA  80 mEq Oral BID   pravastatin  20 mg Oral QHS   sertraline  25 mg Oral Daily   sodium chloride flush  3 mL Intravenous Q12H   sodium  chloride flush  3 mL Intravenous Q12H   spironolactone  50 mg Oral Daily   tamsulosin  0.4 mg Oral Daily   torsemide  100 mg Oral BID   zolpidem  10 mg Oral QHS    Infusions:  sodium chloride     sodium chloride 10 mL/hr at 11/14/20 1329   sodium chloride      PRN Medications: sodium chloride, sodium chloride, acetaminophen, acetaminophen, albuterol, ondansetron (ZOFRAN) IV, ondansetron, polyethylene glycol, sodium chloride, sodium chloride flush, sodium chloride flush   Assessment/Plan  1. Chronic diastolic CHF with suspected RV dysfunction: -Echo during admission July 2022 with EF 60-65%, mild LVH, normal RV, unable to estimate PA systolic pressure, IVC not visualized.  Multiple CHF admissions despite aggressive outpatient diuretic regimen and paramedicine.  RHC in July with optimized filling pressures, PCWP 13, CO/CI 7.3/2.4. -Received IV lasix 80 mg through paramedicine 08/02 -Undergoing CardioMEMS placement this admit. RHC with normal filling pressures. NYHA III at baseline. Suspect much of his edema is due to venous stasis. Ordered Unna boots, needs to be more compliant with compression stockings at home.  -AT home: torsemide 100 mg bid + metolazone 2.5 mg on M/W/F + extra 40 KCl on metolazone days. Reordered here. -K 3.3. Will replace prior to D/C. Scr stable. -Continue spironolactone 50 mg daily -He is off Jardiance (GI upset). -Has clinic f/u scheduled, close monitoring with paramedicine 2. H/o PE/DVT:  -Suspect due to sedentary lifestyle.  Occurred in 2/21.  V/Q scan in 6/21 did not show evidence for chronic PE.   -Continue apixaban. No bleeding issues.  3. OSA:  -Cannot tolerate CPAP. 4. Anxiety/depression:  -This is a major issue.   -Continue sertraline 25 mg daily. 5. CAD:  -Cath 1/22 with no significant CAD. 6. Hypertension:  -Stable. -Continue Toprol XL 25 mg bid.   7. Atrial fibrillation:  -Paroxysmal.  -Continue apixaban. -Continue Toprol XL. 8. Lower  extremity cellulitis: -Admitted in May with possible BLE cellulitis. -Reports recent outpatient treatment for recurrent cellulitis with Levaquin. Notes minimal improvement in erythema, pain and swelling. -Consulted with our ID pharmacist - will treat with doxycycline 100 mg BID X 5 days. No leukocytosis or fever. -Suspect significant venous stasis probably contributing to some of his discomfort. Not wearing compression stockings regularly.  9. Right great toe wound: -Present for at least a few weeks. Noted after trauma from a fall. -WOC consulted.  -No drainage or odor   DISPOSITION: -Stable for discharge home today.  -Has clinic f/u scheduled 08/17. Paramedicine following in the community. -TOC consult to assist with transportation home.  Length of Stay: 0  FINCH, LINDSAY N, PA-C  11/15/2020, 8:29 AM  Advanced Heart Failure Team Pager [phone] (M-F; 7a - 5p)  Please contact [facility]  for night-coverage after hours (5p -7a ) and weekends on [email or web address]   Patient seen with PA, agree with the above note.   Stable today, right groin cath site benign.  Wound consult appreciated.  Cellulitis controlled, plan for doxycycline 100 mg bid x 5 more days.  RHC showed optimized filling pressures and creatinine stable.   Replace K and let him go home on his prior cardiac regimen without changes.  Will follow Cardiomems.    Loralie Champagne 11/15/2020 11:18 AM

## 2020-11-15 NOTE — Discharge Summary (Addendum)
Advanced Heart Failure Team  Discharge Summary   Patient ID: Charles Gray MRN: [id number], DOB/AGE: 1956/01/28 65 y.o. Admit date: 11/14/2020 D/C date:     11/15/2020   Primary Discharge Diagnoses:  Chronic diastolic CHF RV failure Hx PE/DVT OSA Depression CAD Paroxysmal atrial fibrillation Bilateral LE cellulitis Right foot wound   Hospital Course:   1. Chronic diastolic CHF with suspected RV dysfunction: -Echo during admission July 2022 with EF 60-65%, mild LVH, normal RV, unable to estimate PA systolic pressure, IVC not visualized.  Multiple CHF admissions despite aggressive outpatient diuretic regimen and paramedicine.  RHC in July with optimized filling pressures, PCWP 13, CO/CI 7.3/2.4. -Received IV lasix 80 mg through paramedicine 08/02 -Underwent CardioMEMS placement this admit. Right groin site stable. RHC with normal filling pressures. NYHA III at baseline. Suspect much of his edema is due to venous stasis. Ordered Unna boots, needs to be more compliant with compression stockings at home. New pair ordered for D/C. -Continued prior home diuretic at discharge: torsemide 100 mg bid + metolazone 2.5 mg on M/W/F + extra 40 KCl on metolazone days (takes 80 mEq BID daily).  -K 3.3. Will replace prior to D/C. Scr stable. -Continue eplerenone 100 mg daily  -He is off Jardiance (GI upset). -Has clinic f/u scheduled, close monitoring with paramedicine 2. H/o PE/DVT:  -Suspect due to sedentary lifestyle.  Occurred in 2/21.  V/Q scan in 6/21 did not show evidence for chronic PE.   -Continue apixaban. No bleeding issues.  3. OSA:  -Cannot tolerate CPAP. 4. Anxiety/depression:  -This is a major issue.   -Continue sertraline 25 mg daily. -Has declined psych referral 5. CAD:  -Cath 1/22 with no significant CAD. 6. Hypertension:  -Stable. -Continue Toprol XL 25 mg bid.   7. Atrial fibrillation:  -Paroxysmal.  -Continue apixaban. -Continue Toprol XL. 8. Lower extremity  cellulitis: -Admitted in May with possible BLE cellulitis. -Reports recent outpatient treatment for recurrent cellulitis with Levaquin. Noted minimal improvement in erythema, pain and swelling. -Consulted with our ID pharmacist - will treat with doxycycline 100 mg BID X 5 days (will finish abx course at home). No leukocytosis or fever. -Suspect significant venous stasis probably contributing to some of his discomfort. Not wearing compression stockings regularly. New pair ordered at d/c 9. Right great toe wound: -Present for at least a few weeks. Noted after trauma from a fall. -WOC consulted. -No drainage or odor  Discharging home today and resuming HH/PT Has paramedicine in the community  Discharge Weight Range: 364lb -366 lb Discharge Vitals: Blood pressure 113/66, pulse 61, temperature 97.6 F (36.4 C), resp. rate 16, height 6' 10"  (2.083 m), weight (!) 166 kg, SpO2 97 %.  Labs: Lab Results  Component Value Date   WBC 7.1 11/15/2020   HGB 10.7 (L) 11/15/2020   HCT 35.4 (L) 11/15/2020   MCV 79.4 (L) 11/15/2020   PLT 139 (L) 11/15/2020    Recent Labs  Lab 11/15/20 0348  NA 137  K 3.3*  CL 95*  CO2 33*  BUN 17  CREATININE 1.04  CALCIUM 8.7*  GLUCOSE 115*   Lab Results  Component Value Date   CHOL 130 01/28/2020   HDL 37 (L) 01/28/2020   LDLCALC 73 01/28/2020   TRIG 100 01/28/2020   BNP (last 3 results) Recent Labs    10/12/20 0615 10/17/20 1050 10/28/20 1338  BNP 33.4 49.9 28.1    ProBNP (last 3 results) No results for input(s): PROBNP in the last 8760 hours.  Diagnostic Studies/Procedures   CARDIAC CATHETERIZATION  Result Date: 11/14/2020 1. Normal filling pressures. 2. Successful Cardiomems pressure sensor placement.    Discharge Medications   Allergies as of 11/15/2020       Reactions   Penicillins    Broke out in convulsions as a child  Did it involve swelling of the face/tongue/throat, SOB, or low BP? N/A Did it involve sudden or severe  rash/hives, skin peeling, or any reaction on the inside of your mouth or nose? N/A Did you need to seek medical attention at a hospital or doctor's office?N/A When did it last happen? N/A If all above answers are "NO", may proceed with cephalosporin use.        Medication List     STOP taking these medications    levofloxacin 750 MG tablet Commonly known as: LEVAQUIN       TAKE these medications    acetaminophen 500 MG tablet Commonly known as: TYLENOL Take 1,000 mg by mouth every 6 (six) hours as needed for moderate pain or headache.   apixaban 5 MG Tabs tablet Commonly known as: Eliquis Take 1 tablet (5 mg total) by mouth 2 (two) times daily.   aspirin 81 MG chewable tablet Chew 1 tablet (81 mg total) by mouth daily. Take for one month post CardioMEMS Start taking on: November 16, 2020   Buprenorphine HCl-Naloxone HCl 8-2 MG Film Place 1 tablet under the tongue 2 (two) times daily.   doxycycline 100 MG tablet Commonly known as: VIBRA-TABS Take 1 tablet (100 mg total) by mouth every 12 (twelve) hours. Give 2 hours before or 4 hours after iron.   eplerenone 50 MG tablet Commonly known as: INSPRA Take 2 tablets (100 mg total) by mouth daily.   gabapentin 800 MG tablet Commonly known as: NEURONTIN Take 1 tablet (800 mg total) by mouth 3 (three) times daily.   metolazone 2.5 MG tablet Commonly known as: ZAROXOLYN Take 1 tablet (2.5 mg total) by mouth 3 (three) times a week. Monday, Wednesday and Fridays What changed:  when to take this additional instructions   metoprolol succinate 25 MG 24 hr tablet Commonly known as: Toprol XL Take 1 tablet (25 mg total) by mouth 2 (two) times daily.   pantoprazole 40 MG tablet Commonly known as: PROTONIX Take 40 mg by mouth 2 (two) times daily.   potassium chloride SA 20 MEQ tablet Commonly known as: KLOR-CON Take 4 tablets (80 mEq total) by mouth 2 (two) times daily. Extra 40 mEq with metolazone days. What changed:  additional instructions   pravastatin 20 MG tablet Commonly known as: PRAVACHOL Take 1 tablet (20 mg total) by mouth daily. What changed: when to take this   sodium chloride 0.65 % Soln nasal spray Commonly known as: OCEAN Place 1 spray into both nostrils as needed for congestion.   tamsulosin 0.4 MG Caps capsule Commonly known as: FLOMAX Take 1 capsule (0.4 mg total) by mouth daily.   torsemide 100 MG tablet Commonly known as: DEMADEX Take 1 tablet (100 mg total) by mouth 2 (two) times daily.   zolpidem 10 MG tablet Commonly known as: AMBIEN Take 10 mg by mouth at bedtime.       ASK your doctor about these medications    ondansetron 4 MG disintegrating tablet Commonly known as: ZOFRAN-ODT TAKE 1 TABLET BY MOUTH EVERY 8 HOURS AS NEEDED FOR NAUSEA OR VOMITING   polyethylene glycol powder 17 GM/SCOOP powder Commonly known as: GLYCOLAX/MIRALAX Take 17 g by mouth daily.  sertraline 25 MG tablet Commonly known as: ZOLOFT TAKE 1 TABLET BY MOUTH DAILY   Ventolin HFA 108 (90 Base) MCG/ACT inhaler Generic drug: albuterol Inhale 1 puff into the lungs every 4 (four) hours as needed for shortness of breath.               Durable Medical Equipment  (From admission, onward)           Start     Ordered   11/15/20 0000  Heart failure home health orders  (Heart failure home health orders / Face to face)       Comments: Heart Failure Follow-up Care:  Verify follow-up appointments per Patient Discharge Instructions. Confirm transportation arranged. Reconcile home medications with discharge medication list. Remove discontinued medications from use. Assist patient/caregiver to manage medications using pill box. Reinforce low sodium food selection Assessments: Vital signs and oxygen saturation at each visit. Assess home environment for safety concerns, caregiver support and availability of low-sodium foods. Consult Education officer, museum, PT/OT, Dietitian, and CNA based on  assessments. Perform comprehensive cardiopulmonary assessment. Notify MD for any change in condition or weight gain of 3 pounds in one day or 5 pounds in one week with symptoms. Daily Weights and Symptom Monitoring: Ensure patient has access to scales. Teach patient/caregiver to weigh daily before breakfast and after voiding using same scale and record.    Teach patient/caregiver to track weight and symptoms and when to notify Provider. Activity: Develop individualized activity plan with patient/caregiver.   Question Answer Comment  Heart Failure Follow-up Care Advanced Heart Failure (AHF) Clinic at [place] Visits Set up telemonitoring equipment to monitor daily vital signs, weights and oxygen saturation   Obtain the following labs Basic Metabolic Panel   Lab frequency Weekly   Fax lab results to AHF Clinic at [phone]   Diet Low Sodium Heart Healthy   Fluid restrictions: 1500 mL Fluid   Skilled Nurse to notify MD of weight trends weekly for first 2 weeks. May fax or call: AHF Clinic at [phone] (fax) or [phone]      11/15/20 1358            Disposition   The patient will be discharged in stable condition to home. Discharge Instructions     Face-to-face encounter (required for Medicare/Medicaid patients)   Complete by: As directed    I [facility], LINDSAY N certify that this patient is under my care and that I, or a nurse practitioner or physician's assistant working with me, had a face-to-face encounter that meets the physician face-to-face encounter requirements with this patient on 11/15/2020. The encounter with the patient was in whole, or in part for the following medical condition(s) which is the primary reason for home health care (List medical condition): chronic diastolic CHF, deconditioning   The encounter with the patient was in whole, or in part, for the following medical condition, which is the primary reason for home health care: chronic diastolic HF,  deconditioning   I certify that, based on my findings, the following services are medically necessary home health services: Physical therapy   Reason for Medically Necessary Home Health Services:  Skilled Nursing- Skilled Assessment/Observation Therapy- Therapeutic Exercises to Increase Strength and Endurance     My clinical findings support the need for the above services:  Unable to leave home safely without assistance and/or assistive device Unsafe ambulation due to balance issues Pain interferes with ambulation/mobility Shortness of breath with activity     Further, I certify  that my clinical findings support that this patient is homebound due to:  Unsafe ambulation due to balance issues Shortness of Breath with activity     Heart failure home health orders   Complete by: As directed    Heart Failure Follow-up Care:  Verify follow-up appointments per Patient Discharge Instructions. Confirm transportation arranged. Reconcile home medications with discharge medication list. Remove discontinued medications from use. Assist patient/caregiver to manage medications using pill box. Reinforce low sodium food selection Assessments: Vital signs and oxygen saturation at each visit. Assess home environment for safety concerns, caregiver support and availability of low-sodium foods. Consult Education officer, museum, PT/OT, Dietitian, and CNA based on assessments. Perform comprehensive cardiopulmonary assessment. Notify MD for any change in condition or weight gain of 3 pounds in one day or 5 pounds in one week with symptoms. Daily Weights and Symptom Monitoring: Ensure patient has access to scales. Teach patient/caregiver to weigh daily before breakfast and after voiding using same scale and record.    Teach patient/caregiver to track weight and symptoms and when to notify Provider. Activity: Develop individualized activity plan with patient/caregiver.    Heart Failure Follow-up Care: Advanced Heart  Failure (AHF) Clinic at [place] Visits: Set up telemonitoring equipment to monitor daily vital signs, weights and oxygen saturation   Obtain the following labs: Basic Metabolic Panel   Lab frequency: Weekly   Fax lab results to: AHF Clinic at [phone]   Diet: Low Sodium Heart Healthy   Fluid restrictions: 1500 mL Fluid   Skilled Nurse to notify MD of weight trends weekly for first 2 weeks. May fax or call: AHF Clinic at [phone] (fax) or [phone]       Follow-up [place] [place] Follow up.   Specialty: [place] Why: HHRN,HHPT Contact information: [address] [place] [address] [phone]                   Duration of Discharge Encounter: Greater than 35 minutes   Signed, [facility], LINDSAY N  11/15/2020, 2:09 PM   Right groin site stable today.  Patient spent the night after Cardiomems implantation because no one could stay with him at home.  Now ready for discharge.   Loralie Champagne 11/15/2020 3:30 PM

## 2020-11-15 NOTE — Progress Notes (Signed)
Ortho tech didn't apply ulna boots per report last night.  Night shift nurse called several times last night, but didn't get any answer.    Ortho tech called at 0719 to come apply ulna boots.  Ortho tech stated it would be a while because they are waiting on cobain wrap.  He stated he will come apply the ulna boots once the cobain wrap arrives.

## 2020-11-15 NOTE — Consult Note (Signed)
[place] Nurse Consult Note: Patient receiving care in [place] Reason for Consult: Right great toe Wound type: Right great toe has a 1 x 1 brown area that he states is r/t breaking his toe. No drainage. Placed a small piece of Xeroform on the wound and wrapped the toe with Kerlix. Spoke with bedside RN Rasheen and he has called Ortho yesterday and this morning to come and apply the [employer] ordered previously by Philmore Pali, PA-C however ortho tech states they are out of Coban and will come to apply as soon as it arrives.  Bilateral LE are edematous and have hemosiderin staining.  Dressing procedure/placement/frequency: Apply small piece of Xeroform gauze to the Right Great Toe and wrap with Kerlix. Unna boots to be applied by Ortho bilaterally today. Unna boots to be applied on Monday and Thurs of each week.   Monitor the wound area(s) for worsening of condition such as: Signs/symptoms of infection, increase in size, development of or worsening of odor, development of pain, or increased pain at the affected locations.   Notify the medical team if any of these develop.  Thank you for the consult. [place] nurse will not follow at this time.   Please re-consult the [place] team if needed.  Cathlean Marseilles Tamala Julian, MSN, RN, [place], Lysle Pearl, [facility] Wound Treatment Associate Pager [phone]

## 2020-11-15 NOTE — Plan of Care (Signed)

## 2020-11-15 NOTE — Progress Notes (Signed)
Orthopedic Tech Progress Note Patient Details:  Charles Gray [birth year] [id number]  Ortho Devices Type of Ortho Device: Haematologist Ortho Device/Splint Location: Bilateral Ortho Device/Splint Interventions: Application, Ordered   Post Interventions Patient Tolerated: Well  Bradyn Vassey A Ireta Pullman 11/15/2020, 2:31 PM

## 2020-11-16 ENCOUNTER — Telehealth: Payer: Self-pay | Admitting: Physician Assistant

## 2020-11-16 NOTE — Telephone Encounter (Signed)
   Pt called because he is having trouble with the CardioMEMS device.  He has trouble moving himself around and says he cannot get the pillow in position before it times out.  He has tried this several times.   Because of his physical disabilities, he is not able to move himself around like he needs to to get the pillow in place.  He is concerned that he should not keep trying it that that like damage something.  I advised that I did not believe it would hurt the device for him to keep trying, but that the best thing to do is probably to call the company and get them to help him figure out how he can make this work.  He states he has physical therapy starting soon and perhaps the physical therapist can help him as well.  No other problems or concerns.  He states he will call the company and call back if he has any additional questions.  Rosaria Ferries, PA-C 11/16/2020 10:37 AM

## 2020-11-17 ENCOUNTER — Encounter (HOSPITAL_COMMUNITY): Payer: Self-pay

## 2020-11-19 ENCOUNTER — Telehealth: Payer: Self-pay | Admitting: *Deleted

## 2020-11-19 ENCOUNTER — Other Ambulatory Visit (HOSPITAL_COMMUNITY): Payer: Self-pay

## 2020-11-19 NOTE — Progress Notes (Signed)
Paramedicine Encounter    Patient ID: Charles Gray, male    DOB: 1955-09-23, 65 y.o.   MRN: [id number]   Patient Care Team: Charles Skeens., MD as PCP - General (Family Medicine) Charles Salines, DO as PCP - Cardiology (Cardiology) Charles Ny, LCSW as Social Worker (Licensed Clinical Social Worker)  Patient Active Problem List   Diagnosis Date Noted   Chronic diastolic (congestive) heart failure ([place]) 11/14/2020   Syncope 10/28/2020   CHF (congestive heart failure) ([place]) 10/28/2020   Chronic venous stasis dermatitis of both lower extremities 08/27/2020   Cellulitis of both lower extremities 08/14/2020   Microcytic anemia 08/14/2020   GERD without esophagitis 06/10/2020   Atrial fibrillation with rapid ventricular response ([place]) 03/24/2020   Acute on chronic right heart failure ([place]) 01/27/2020   Acute respiratory failure with hypoxia ([place])    Fatty liver disease, nonalcoholic    Shock ([place])    Personal history of DVT (deep vein thrombosis) 12/26/2019   Chronic anticoagulation 12/26/2019   Palpitations 12/26/2019   AF (paroxysmal atrial fibrillation) ([place]) 12/23/2019   Heart failure, systolic, acute ([place]) 41/96/2229   Acute on chronic congestive heart failure (HCC)    Chronic diastolic HF (heart failure) ([place]) 08/23/2019   Chest pain 08/23/2019   Lactic acidosis 08/23/2019   History of pulmonary embolism 08/12/2019   Atrial tachycardia ([place]) 08/12/2019   Occasional tremors 08/12/2019   OSA (obstructive sleep apnea) 08/12/2019   Acute on chronic right-sided heart failure ([place]) 08/06/2019   Pulmonary embolism ([place]) 06/08/2019   Normocytic anemia 06/08/2019   Pulmonary emboli ([place]) 06/08/2019   Chronic heart failure with preserved ejection fraction ([place]) 05/30/2019   Class 2 severe obesity due to excess calories with serious comorbidity and body mass index (BMI) of 39.0 to 39.9 in adult ([place]) 05/30/2019   Morbid obesity with BMI of 40.0-44.9, adult ([place]) 05/30/2019    Hyponatremia    Drug induced constipation    Peripheral edema    Hypotension due to drugs    Hypoalbuminemia due to protein-calorie malnutrition (HCC)    Hypokalemia    Thrombocytopenia (HCC)    Anemia of chronic disease    Cirrhosis of liver without ascites (HCC)    Chronic pain syndrome    Debility 03/13/2019   Portal hypertensive gastropathy ([place]) 03/06/2019   Dyspnea    GIB (gastrointestinal bleeding) 03/04/2019   Mixed hyperlipidemia 01/24/2019   Insomnia 01/24/2019   Hyperlipidemia 01/24/2019   Essential hypertension 01/24/2019   Lumbar disc herniation 01/24/2019    Current Outpatient Medications:    acetaminophen (TYLENOL) 500 MG tablet, Take 1,000 mg by mouth every 6 (six) hours as needed for moderate pain or headache., Disp: , Rfl:    apixaban (ELIQUIS) 5 MG TABS tablet, Take 1 tablet (5 mg total) by mouth 2 (two) times daily., Disp: 60 tablet, Rfl: 0   aspirin 81 MG chewable tablet, Chew 1 tablet (81 mg total) by mouth daily. Take for one month post CardioMEMS, Disp: 30 tablet, Rfl: 0   Buprenorphine HCl-Naloxone HCl 8-2 MG FILM, Place 1 tablet under the tongue 2 (two) times daily., Disp: , Rfl:    doxycycline (VIBRA-TABS) 100 MG tablet, Take 1 tablet (100 mg total) by mouth every 12 (twelve) hours. Give 2 hours before or 4 hours after iron., Disp: 8 tablet, Rfl: 0   eplerenone (INSPRA) 50 MG tablet, Take 2 tablets (100 mg total) by mouth daily., Disp: 60 tablet, Rfl: 3   gabapentin (NEURONTIN) 800 MG tablet, Take 1  tablet (800 mg total) by mouth 3 (three) times daily., Disp: 90 tablet, Rfl: 1   metolazone (ZAROXOLYN) 2.5 MG tablet, Take 1 tablet (2.5 mg total) by mouth 3 (three) times a week. Monday, Wednesday and Fridays, Disp: 14 tablet, Rfl: 0   metoprolol succinate (TOPROL XL) 25 MG 24 hr tablet, Take 1 tablet (25 mg total) by mouth 2 (two) times daily., Disp: 60 tablet, Rfl: 2   ondansetron (ZOFRAN-ODT) 4 MG disintegrating tablet, TAKE 1 TABLET BY MOUTH EVERY 8 HOURS AS  NEEDED FOR NAUSEA OR VOMITING (Patient taking differently: Take 4 mg by mouth every 8 (eight) hours as needed for vomiting or nausea.), Disp: 20 tablet, Rfl: 0   pantoprazole (PROTONIX) 40 MG tablet, Take 40 mg by mouth 2 (two) times daily., Disp: , Rfl:    polyethylene glycol powder (GLYCOLAX/MIRALAX) 17 GM/SCOOP powder, Take 17 g by mouth daily. (Patient taking differently: Take 17 g by mouth daily as needed for mild constipation or moderate constipation.), Disp: 255 g, Rfl: 0   potassium chloride SA (KLOR-CON) 20 MEQ tablet, Take 4 tablets (80 mEq total) by mouth 2 (two) times daily. Extra 40 mEq with metolazone days., Disp: 260 tablet, Rfl: 0   pravastatin (PRAVACHOL) 20 MG tablet, Take 1 tablet (20 mg total) by mouth daily., Disp: 30 tablet, Rfl: 1   sertraline (ZOLOFT) 25 MG tablet, TAKE 1 TABLET BY MOUTH DAILY (Patient taking differently: Take 25 mg by mouth daily.), Disp: 30 tablet, Rfl: 0   sodium chloride (OCEAN) 0.65 % SOLN nasal spray, Place 1 spray into both nostrils as needed for congestion., Disp: , Rfl:    tamsulosin (FLOMAX) 0.4 MG CAPS capsule, Take 1 capsule (0.4 mg total) by mouth daily., Disp: 30 capsule, Rfl: 0   torsemide (DEMADEX) 100 MG tablet, Take 1 tablet (100 mg total) by mouth 2 (two) times daily., Disp: 60 tablet, Rfl: 3   VENTOLIN HFA 108 (90 Base) MCG/ACT inhaler, Inhale 1 puff into the lungs every 4 (four) hours as needed for shortness of breath. (Patient taking differently: Inhale 1 puff into the lungs every 6 (six) hours as needed for shortness of breath or wheezing.), Disp: 6.7 g, Rfl: 1   zolpidem (AMBIEN) 10 MG tablet, Take 10 mg by mouth at bedtime., Disp: , Rfl:  Allergies  Allergen Reactions   Penicillins     Broke out in convulsions as a child  Did it involve swelling of the face/tongue/throat, SOB, or low BP? N/A Did it involve sudden or severe rash/hives, skin peeling, or any reaction on the inside of your mouth or nose? N/A Did you need to seek medical  attention at a hospital or doctor's office?N/A When did it last happen? N/A If all above answers are "NO", may proceed with cephalosporin use.     Social History   Socioeconomic History   Marital status: Single    Spouse name: Not on file   Number of children: Not on file   Years of education: Not on file   Highest education level: Not on file  Occupational History   Not on file  Tobacco Use   Smoking status: Never   Smokeless tobacco: Never  Vaping Use   Vaping Use: Never used  Substance and Sexual Activity   Alcohol use: Not Currently   Drug use: Not Currently    Types: Marijuana    Comment: Smoked for 30 years   Sexual activity: Not on file  Other Topics Concern   Not on file  Social History Narrative   Not on file   Social Determinants of Health   Financial Resource Strain: Medium Risk   Difficulty of Paying Living Expenses: Somewhat hard  Food Insecurity: No Food Insecurity   Worried About Charity fundraiser in the Last Year: Never true   Ran Out of Food in the Last Year: Never true  Transportation Needs: Unmet Transportation Needs   Lack of Transportation (Medical): Yes   Lack of Transportation (Non-Medical): No  Physical Activity: Not on file  Stress: Not on file  Social Connections: Not on file  Intimate Partner Violence: Not on file    Physical Exam Vitals reviewed.  Constitutional:      Appearance: Normal appearance. He is normal weight.  HENT:     Head: Normocephalic.     Nose: Nose normal.     Mouth/Throat:     Mouth: Mucous membranes are moist.     Pharynx: Oropharynx is clear.  Eyes:     Conjunctiva/sclera: Conjunctivae normal.     Pupils: Pupils are equal, round, and reactive to light.  Cardiovascular:     Rate and Rhythm: Normal rate and regular rhythm.     Pulses: Normal pulses.     Heart sounds: Normal heart sounds.  Pulmonary:     Effort: Pulmonary effort is normal.     Breath sounds: Normal breath sounds.  Abdominal:      General: Abdomen is flat.     Palpations: Abdomen is soft.  Musculoskeletal:        General: Normal range of motion.     Cervical back: Normal range of motion.     Comments: Una boots in place on both lower legs.  Skin:    General: Skin is warm and dry.     Capillary Refill: Capillary refill takes less than 2 seconds.  Neurological:     General: No focal deficit present.     Mental Status: He is alert. Mental status is at baseline.  Psychiatric:        Mood and Affect: Mood normal.  Confirmed with Charles Bo, NP that no IV Lasix was needed this week.   Arrived for home visit for Charles Gray who was alert and oriented reporting to be feeling "sick" this morning. When asked to elaborate he reports he is constipated and has not had a solid bowel movement in 3 days. He says he has not been using the stool softener or Miralax daily. I instructed him to use Miralax today on our visit, and he did so following vitals and exam.   Vitals were within normal range, weight down 9lbs this week. Both lower legs wrapped with [employer]. Home health nurse is to be reporting to see patient today.   Charles Gray states he has been trying to submit a reading for his CardioMems over the last few says with no success. He states he it is telling him to shift positions often and interfering with the metal of his bed frame. I assisted patient in obtaining a reading and after multiple attempts we were successful and clinic staff confirmed they received it. I encouraged Charles Gray to not get discouraged over difficult positioning for CardioMems as he was very upset he wasn't successful on his own.   I set up transportation for [place] for 8/17 appointment in clinic at 8:30.  They will call him for pick up time.   Medications were reviewed and confirmed. Charles Gray is almost done with his antibiotic regimen. Medications placed in pill  box accordingly.   Fines and I talked at length about his emotional feelings as well as his health concerns.  He was depressed but understanding at the end of our visit feeling defeating with chronic illness. I continue to encourage him. Charles Gray is appreciative of this.   I will see Charles Gray in one week in the home. Visit complete.   Refills:  NONE      Future Appointments  Date Time Provider [place]  11/27/2020  8:30 AM [facility] PA/NP [facility] None     ACTION: Home visit completed

## 2020-11-21 ENCOUNTER — Telehealth (HOSPITAL_COMMUNITY): Payer: Self-pay

## 2020-11-21 NOTE — Telephone Encounter (Signed)
Charles Gray texted me reporting he successfully submitted a cardio mems reading for today and was curious of the outcome because he has had some increased swelling in his legs and feet today. I reached out to clinic and they report his readings look good and to continue with his normal regimen. Charles Gray understood and agreed with plan. I will follow up next week. Call complete.

## 2020-11-26 ENCOUNTER — Other Ambulatory Visit (HOSPITAL_COMMUNITY): Payer: Self-pay

## 2020-11-26 NOTE — Progress Notes (Signed)
ADVANCED HF CLINIC NOTE  Primary Care: Enid Skeens., MD Primary Cardiologist: Dr. Berniece Salines, DO  HF Cardiology: Dr. [name]  HPI: 65 y.o. 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    He has had multiple hospitalizations in 2021 for CHF exacerbation, requiring IV Lasix and change in PO regimen from lasix to torsemide. Echo 4/21 showed normal LVEF 55-60% w/ G1DD. RV not well visualized, but felt to have component of RV failure. [place] 4/21 showed normal PA pressure and normal cardiac output. PFTs also completed and c/w mild obstructive airway disease.   In 7/21, he had a coronary CTA.  This showed no coronary calcium but there was a concern for soft plaque in the distal LCx and distal RCA with hemodynamically significant stenosis.  He was managed medically. He has not had any chest pain, but was started on ranolazine after his CTA.  He feels like it makes his "head fuzzy."    Admitted with A/C diastolic heart failure. Diuresed with IV lasix and transitioned to bumex 3 mg twice a day. Discharge weight 360 pounds. Discharged 02/09/2020.   He was admitted again in 12/21 with CHF.  Echo in 12/21 showed EF 28-76%, grade 2 diastolic dysfunction, RV poorly visualized.   OV on 12/21 still having significant exertional dyspnea, short of breath just walking around the house.  + Orthopnea, has to sleep on the sofa.  Sleeps poorly, cannot tolerate CPAP.  Cardiomems was discussed but patient reluctant, Jardiance added, metolazone increased to 2 x/week, metoprolol stopped, and Toprol XL 25 mg daily started.  Admitted [birth year] with A/C diastolic heart failure. Diuresed with IV lasix + metolazone. Transitioned to torsemide 60 mg twice a day and metolazone twice a day. D/C weight 364 pounds.   Admitted 5/28//22 with cellulitis. Treated with antibiotics.   Returned 09/10/20 for HF follow up. Had palpitations  and EMS checked his EKG. EKG showed SR with rate in the 70s. Having ongoing leg swelling. SOB with exertion.  Weight at home 373-381 pounds. Zio patch placed.  Admitted 09/20/20 admitted with SOB and leg swelling secondary to A/CHF. He was diuresed with IV lasix 5 L, weight 371 at discharge.  He was admitted for A/C CHF 7/22 after presenting with LE swelling and complaints of dizziness with fall.  He had failed outpatient IV diuresis. He was diuresed with IV lasix + metolazone. He underwent RHC showing optimized filling pressures and preserved CO/CI. Discussed Cardiomems inpatient , and he agreed to work up. Insurance approval pending. He was discharged on torsemide 100 mg bid + metolazone M/W/F. Discharge weight 374 lbs.  RHC (8/22) showed normal filling pressures & he underwent Cardiomems placement.  Today he returns for HF follow up post-Cardiomems placement here with Paramedicine. Able to get around the house without significant dyspnea, but uses walker for further distances. Occasional atypical chest pain, resolves spontaneously. Chronically sleeps in recliner. Still dizzy w/ head movements, takes Zofran for nausea with this. Appetite ok. No fever or chills. Weight at home 361 pounds. Taking all medications.   ECG (personally reviewed): SR 66 bpm.  Cardiomems: PAD 14 today, 15 on implant (no goal yet).  Labs (6/21): K 4.3, creatinine 1.03 Labs (10/21): LDL 73 Labs (12/21): K 3.8, creatinine 0.98 Labs (3/22): K 3.1 Creatinine 0.9  Labs (5/22): K 3.7 Creatinine 0.94  Labs (6/22): K 3.3, creatinine 1.13 Labs (7/22): K 3.5, creatinine 0.99 Labs (8/22): K 3.7, creatinine  1.09, hgb 10.7  PMH: 1. HTN 2. OSA 3. Hyperlipidemia 4. Low back pain 5. PE/DVT in 2/21.  - V/Q scan in 6/21 was not suggestive of chronic PE.  6. Nonalcoholic steatohepatitis.  7. GI bleed in 10/20.  8. Anxiety 9. CAD: Coronary CTA in 7/21 with calcium score 0 but concern for soft plaque distal CFX and RCA.  By [facility],  there was flow-limiting stenosis in distal CFX and distal RCA.  10. Chronic diastolic CHF: Echo in 2/45 with EF 55-60%, RV poorly visualized.  - RHC (4/21): mean RA 8, PA 20/8, mean PCWP 11, CI 2.8 - Echo (12/21):EF 80-99%, grade 2 diastolic dysfunction, RV poorly visualized.   - RHC (8/22) normal filling pressures, RA mean 7, RV 29/9, PA 32/15, mean 22, PCWP mean 13, CO/CI 7.32/2.41. - Cardiomems (8/22) placement. 11. PFTs (4/21): Mild obstruction.  12. ABIs (10/21): Normal.  Review of Systems: All systems reviewed and negative except as per HPI.   Current Outpatient Medications  Medication Sig Dispense Refill   acetaminophen (TYLENOL) 500 MG tablet Take 1,000 mg by mouth every 6 (six) hours as needed for moderate pain or headache.     apixaban (ELIQUIS) 5 MG TABS tablet Take 1 tablet (5 mg total) by mouth 2 (two) times daily. 60 tablet 0   Buprenorphine HCl-Naloxone HCl 8-2 MG FILM Place 1 tablet under the tongue 2 (two) times daily.     eplerenone (INSPRA) 50 MG tablet Take 2 tablets (100 mg total) by mouth daily. 60 tablet 3   gabapentin (NEURONTIN) 800 MG tablet Take 1 tablet (800 mg total) by mouth 3 (three) times daily. 90 tablet 1   metolazone (ZAROXOLYN) 2.5 MG tablet Take 1 tablet (2.5 mg total) by mouth 3 (three) times a week. Monday, Wednesday and Fridays 14 tablet 0   metoprolol succinate (TOPROL XL) 25 MG 24 hr tablet Take 1 tablet (25 mg total) by mouth 2 (two) times daily. 60 tablet 2   ondansetron (ZOFRAN-ODT) 4 MG disintegrating tablet TAKE 1 TABLET BY MOUTH EVERY 8 HOURS AS NEEDED FOR NAUSEA OR VOMITING 20 tablet 0   pantoprazole (PROTONIX) 40 MG tablet Take 40 mg by mouth 2 (two) times daily.     polyethylene glycol powder (GLYCOLAX/MIRALAX) 17 GM/SCOOP powder Take 17 g by mouth daily. (Patient taking differently: Take 17 g by mouth daily as needed for mild constipation or moderate constipation.) 255 g 0   potassium chloride SA (KLOR-CON) 20 MEQ tablet Take 4 tablets (80  mEq total) by mouth 2 (two) times daily. Extra 40 mEq with metolazone days. 260 tablet 0   pravastatin (PRAVACHOL) 20 MG tablet Take 1 tablet (20 mg total) by mouth daily. 30 tablet 1   sertraline (ZOLOFT) 25 MG tablet TAKE 1 TABLET BY MOUTH DAILY 30 tablet 0   sodium chloride (OCEAN) 0.65 % SOLN nasal spray Place 1 spray into both nostrils as needed for congestion.     tamsulosin (FLOMAX) 0.4 MG CAPS capsule Take 1 capsule (0.4 mg total) by mouth daily. 30 capsule 0   torsemide (DEMADEX) 100 MG tablet Take 1 tablet (100 mg total) by mouth 2 (two) times daily. 60 tablet 3   VENTOLIN HFA 108 (90 Base) MCG/ACT inhaler Inhale 1 puff into the lungs every 4 (four) hours as needed for shortness of breath. 6.7 g 1   zolpidem (AMBIEN) 10 MG tablet Take 10 mg by mouth at bedtime.     No current facility-administered medications for this encounter.  Allergies  Allergen Reactions   Penicillins     Broke out in convulsions as a child  Did it involve swelling of the face/tongue/throat, SOB, or low BP? N/A Did it involve sudden or severe rash/hives, skin peeling, or any reaction on the inside of your mouth or nose? N/A Did you need to seek medical attention at a hospital or doctor's office?N/A When did it last happen? N/A If all above answers are "NO", may proceed with cephalosporin use.   Social History   Socioeconomic History   Marital status: Single    Spouse name: Not on file   Number of children: Not on file   Years of education: Not on file   Highest education level: Not on file  Occupational History   Not on file  Tobacco Use   Smoking status: Never   Smokeless tobacco: Never  Vaping Use   Vaping Use: Never used  Substance and Sexual Activity   Alcohol use: Not Currently   Drug use: Not Currently    Types: Marijuana    Comment: Smoked for 30 years   Sexual activity: Not on file  Other Topics Concern   Not on file  Social History Narrative   Not on file   Social Determinants  of Health   Financial Resource Strain: Medium Risk   Difficulty of Paying Living Expenses: Somewhat hard  Food Insecurity: No Food Insecurity   Worried About Running Out of Food in the Last Year: Never true   Ran Out of Food in the Last Year: Never true  Transportation Needs: Unmet Transportation Needs   Lack of Transportation (Medical): Yes   Lack of Transportation (Non-Medical): No  Physical Activity: Not on file  Stress: Not on file  Social Connections: Not on file  Intimate Partner Violence: Not on file    Family History  Problem Relation Age of Onset   Hyperlipidemia Mother    Hypertension Mother    Stroke Mother    CAD Maternal Grandmother    CAD Maternal Grandfather    BP 120/70   Pulse 68   Wt (!) 167.4 kg (369 lb)   SpO2 96%   BMI 38.58 kg/m   Wt Readings from Last 3 Encounters:  11/27/20 (!) 167.4 kg (369 lb)  11/26/20 (!) 164.7 kg (363 lb)  11/19/20 (!) 164.2 kg (362 lb)   PHYSICAL EXAM: General:  NAD. No resp difficulty, arrived in [facility] HEENT: Normal Neck: Supple. Thick neck. Carotids 2+ bilat; no bruits. No lymphadenopathy or thryomegaly appreciated. Cor: PMI nondisplaced. Regular rate & rhythm. No rubs, gallops or murmurs. Lungs: Clear, diminished in bases. Abdomen: Obese, nontender, nondistended. No hepatosplenomegaly. No bruits or masses. Good bowel sounds. Extremities: No cyanosis, clubbing, rash, venous stasis changes to BLE, trace LE edema Neuro: Alert & oriented x 3, cranial nerves grossly intact. Moves all 4 extremities w/o difficulty. Affect pleasant.  ASSESSMENT & PLAN: 1. Chronic diastolic CHF with suspected RV dysfunction: Echo 7/22 with EF 60-65%, mild LVH, normal RV, unable to estimate PA systolic pressure. Multiple CHF admissions despite aggressive outpatient diuretic regimen and paramedicine.  RHC (7/22) with optimized filling pressures, PCWP 13, CO/CI 7.3/2.4. Received IV lasix 80 mg through paramedicine 11/12/20. RHC (8/22) with normal  filling pressures; underwent placement of Cardiomems. NYHA III at baseline. He is not volume overloaded on exam or by Cardiomems. His LE edema is vasty improved. Suspect much of his previous LE edema is due to venous stasis. Needs to be more compliant with compression stockings  at home.  - Continue torsemide 100 mg bid + metolazone 2.5 mg on M/W/F + extra 40 KCl on metolazone days.  - Continue eplerenone 100 mg daily. BMET today. - Off Jardiance (GI upset). 2. H/o PE/DVT: Suspect due to sedentary lifestyle.  Occurred in 2/21.  V/Q scan in 6/21 did not show evidence for chronic PE.   - Continue apixaban. No bleeding issues.  3. OSA: Cannot tolerate CPAP. 4. Anxiety/depression: This is a major issue.   - Continue sertraline 25 mg daily. He declines further up-titration or referral to psychiatry today. 5. CAD: Cath 1/22 with no significant CAD. No CP. 6. Hypertension: Stable. - Continue Toprol XL 25 mg bid.   7. Atrial fibrillation: Paroxysmal. NSR by ECG today. - Continue apixaban. CBC today. - Continue Toprol XL. 8. Lower extremity cellulitis: Admitted 5/22 with possible BLE cellulitis. - Failed outpatient treatment with Levaquin. Finished 5 days of doxycycline 100 mg BID.  No fever. Much improved. - Not wearing compression stockings regularly.  9. Dizziness: On-going. Occurs with head movement. He sees his PCP next week. - Continue Zofran as needed.  - Given BPPV exercises today to try.  Follow up in 6-8 weeks. Appreciate Paramedicine's assistance.  Maricela Bo [facility] FNP-BC  11/27/2020

## 2020-11-26 NOTE — Progress Notes (Signed)
Paramedicine Encounter    Patient ID: Charles Gray, male    DOB: [birth year], 65 y.o.   MRN: [id number]   Patient Care Team: Enid Skeens., MD as PCP - General (Family Medicine) Berniece Salines, DO as PCP - Cardiology (Cardiology) Jorge Ny, LCSW as Social Worker (Licensed Clinical Social Worker)  Patient Active Problem List   Diagnosis Date Noted   Chronic diastolic (congestive) heart failure ([place]) 11/14/2020   Syncope 10/28/2020   CHF (congestive heart failure) ([place]) 10/28/2020   Chronic venous stasis dermatitis of both lower extremities 08/27/2020   Cellulitis of both lower extremities 08/14/2020   Microcytic anemia 08/14/2020   GERD without esophagitis 06/10/2020   Atrial fibrillation with rapid ventricular response ([place]) 03/24/2020   Acute on chronic right heart failure ([place]) 01/27/2020   Acute respiratory failure with hypoxia ([place])    Fatty liver disease, nonalcoholic    Shock ([place])    Personal history of DVT (deep vein thrombosis) 12/26/2019   Chronic anticoagulation 12/26/2019   Palpitations 12/26/2019   AF (paroxysmal atrial fibrillation) ([place]) 12/23/2019   Heart failure, systolic, acute ([place]) 90/24/0973   Acute on chronic congestive heart failure (HCC)    Chronic diastolic HF (heart failure) ([place]) 08/23/2019   Chest pain 08/23/2019   Lactic acidosis 08/23/2019   History of pulmonary embolism 08/12/2019   Atrial tachycardia ([place]) 08/12/2019   Occasional tremors 08/12/2019   OSA (obstructive sleep apnea) 08/12/2019   Acute on chronic right-sided heart failure ([place]) 08/06/2019   Pulmonary embolism ([place]) 06/08/2019   Normocytic anemia 06/08/2019   Pulmonary emboli ([place]) 06/08/2019   Chronic heart failure with preserved ejection fraction ([place]) 05/30/2019   Class 2 severe obesity due to excess calories with serious comorbidity and body mass index (BMI) of 39.0 to 39.9 in adult ([place]) 05/30/2019   Morbid obesity with BMI of 40.0-44.9, adult ([place]) 05/30/2019    Hyponatremia    Drug induced constipation    Peripheral edema    Hypotension due to drugs    Hypoalbuminemia due to protein-calorie malnutrition (HCC)    Hypokalemia    Thrombocytopenia (HCC)    Anemia of chronic disease    Cirrhosis of liver without ascites (HCC)    Chronic pain syndrome    Debility 03/13/2019   Portal hypertensive gastropathy ([place]) 03/06/2019   Dyspnea    GIB (gastrointestinal bleeding) 03/04/2019   Mixed hyperlipidemia 01/24/2019   Insomnia 01/24/2019   Hyperlipidemia 01/24/2019   Essential hypertension 01/24/2019   Lumbar disc herniation 01/24/2019    Current Outpatient Medications:    acetaminophen (TYLENOL) 500 MG tablet, Take 1,000 mg by mouth every 6 (six) hours as needed for moderate pain or headache., Disp: , Rfl:    apixaban (ELIQUIS) 5 MG TABS tablet, Take 1 tablet (5 mg total) by mouth 2 (two) times daily., Disp: 60 tablet, Rfl: 0   aspirin 81 MG chewable tablet, Chew 1 tablet (81 mg total) by mouth daily. Take for one month post CardioMEMS, Disp: 30 tablet, Rfl: 0   Buprenorphine HCl-Naloxone HCl 8-2 MG FILM, Place 1 tablet under the tongue 2 (two) times daily., Disp: , Rfl:    doxycycline (VIBRA-TABS) 100 MG tablet, Take 1 tablet (100 mg total) by mouth every 12 (twelve) hours. Give 2 hours before or 4 hours after iron., Disp: 8 tablet, Rfl: 0   eplerenone (INSPRA) 50 MG tablet, Take 2 tablets (100 mg total) by mouth daily., Disp: 60 tablet, Rfl: 3   gabapentin (NEURONTIN) 800 MG tablet, Take 1  tablet (800 mg total) by mouth 3 (three) times daily., Disp: 90 tablet, Rfl: 1   metolazone (ZAROXOLYN) 2.5 MG tablet, Take 1 tablet (2.5 mg total) by mouth 3 (three) times a week. Monday, Wednesday and Fridays, Disp: 14 tablet, Rfl: 0   metoprolol succinate (TOPROL XL) 25 MG 24 hr tablet, Take 1 tablet (25 mg total) by mouth 2 (two) times daily., Disp: 60 tablet, Rfl: 2   ondansetron (ZOFRAN-ODT) 4 MG disintegrating tablet, TAKE 1 TABLET BY MOUTH EVERY 8 HOURS AS  NEEDED FOR NAUSEA OR VOMITING (Patient taking differently: Take 4 mg by mouth every 8 (eight) hours as needed for vomiting or nausea.), Disp: 20 tablet, Rfl: 0   pantoprazole (PROTONIX) 40 MG tablet, Take 40 mg by mouth 2 (two) times daily., Disp: , Rfl:    polyethylene glycol powder (GLYCOLAX/MIRALAX) 17 GM/SCOOP powder, Take 17 g by mouth daily. (Patient taking differently: Take 17 g by mouth daily as needed for mild constipation or moderate constipation.), Disp: 255 g, Rfl: 0   potassium chloride SA (KLOR-CON) 20 MEQ tablet, Take 4 tablets (80 mEq total) by mouth 2 (two) times daily. Extra 40 mEq with metolazone days., Disp: 260 tablet, Rfl: 0   pravastatin (PRAVACHOL) 20 MG tablet, Take 1 tablet (20 mg total) by mouth daily., Disp: 30 tablet, Rfl: 1   sertraline (ZOLOFT) 25 MG tablet, TAKE 1 TABLET BY MOUTH DAILY (Patient taking differently: Take 25 mg by mouth daily.), Disp: 30 tablet, Rfl: 0   sodium chloride (OCEAN) 0.65 % SOLN nasal spray, Place 1 spray into both nostrils as needed for congestion., Disp: , Rfl:    tamsulosin (FLOMAX) 0.4 MG CAPS capsule, Take 1 capsule (0.4 mg total) by mouth daily., Disp: 30 capsule, Rfl: 0   torsemide (DEMADEX) 100 MG tablet, Take 1 tablet (100 mg total) by mouth 2 (two) times daily., Disp: 60 tablet, Rfl: 3   VENTOLIN HFA 108 (90 Base) MCG/ACT inhaler, Inhale 1 puff into the lungs every 4 (four) hours as needed for shortness of breath. (Patient taking differently: Inhale 1 puff into the lungs every 6 (six) hours as needed for shortness of breath or wheezing.), Disp: 6.7 g, Rfl: 1   zolpidem (AMBIEN) 10 MG tablet, Take 10 mg by mouth at bedtime., Disp: , Rfl:  Allergies  Allergen Reactions   Penicillins     Broke out in convulsions as a child  Did it involve swelling of the face/tongue/throat, SOB, or low BP? N/A Did it involve sudden or severe rash/hives, skin peeling, or any reaction on the inside of your mouth or nose? N/A Did you need to seek medical  attention at a hospital or doctor's office?N/A When did it last happen? N/A If all above answers are "NO", may proceed with cephalosporin use.     Social History   Socioeconomic History   Marital status: Single    Spouse name: Not on file   Number of children: Not on file   Years of education: Not on file   Highest education level: Not on file  Occupational History   Not on file  Tobacco Use   Smoking status: Never   Smokeless tobacco: Never  Vaping Use   Vaping Use: Never used  Substance and Sexual Activity   Alcohol use: Not Currently   Drug use: Not Currently    Types: Marijuana    Comment: Smoked for 30 years   Sexual activity: Not on file  Other Topics Concern   Not on file  Social History Narrative   Not on file   Social Determinants of Health   Financial Resource Strain: Medium Risk   Difficulty of Paying Living Expenses: Somewhat hard  Food Insecurity: No Food Insecurity   Worried About Charity fundraiser in the Last Year: Never true   Ran Out of Food in the Last Year: Never true  Transportation Needs: Unmet Transportation Needs   Lack of Transportation (Medical): Yes   Lack of Transportation (Non-Medical): No  Physical Activity: Not on file  Stress: Not on file  Social Connections: Not on file  Intimate Partner Violence: Not on file    Physical Exam Vitals reviewed.  Constitutional:      Appearance: Normal appearance. He is normal weight.  HENT:     Head: Normocephalic.     Nose: Nose normal.     Mouth/Throat:     Mouth: Mucous membranes are moist.     Pharynx: Oropharynx is clear.  Eyes:     Pupils: Pupils are equal, round, and reactive to light.  Cardiovascular:     Rate and Rhythm: Normal rate and regular rhythm.     Pulses: Normal pulses.     Heart sounds: Normal heart sounds.  Pulmonary:     Effort: Pulmonary effort is normal.     Breath sounds: Normal breath sounds.  Abdominal:     General: Abdomen is flat.     Palpations: Abdomen  is soft.     Comments: ABDOMINAL PAIN WITH CRAMPS, FREQUENT CONSTIPATION.  Musculoskeletal:        General: Swelling present. Normal range of motion.     Cervical back: Normal range of motion.     Right lower leg: Edema present.     Left lower leg: Edema present.     Comments: SWELLING, BUT IMPROVED FROM LAST WEEK. IS WEARING HIS COMPRESSION SOCKS OFTEN.   Skin:    General: Skin is warm and dry.     Capillary Refill: Capillary refill takes less than 2 seconds.  Neurological:     General: No focal deficit present.     Mental Status: He is alert. Mental status is at baseline.  Psychiatric:        Mood and Affect: Mood normal.    Arrived for home visit for Reyansh who was alert and oriented reporting to have some abdominal pain with cramps as well as in his arms and legs. Ryatt took his medications as prescribed including his metolazone yesterday with potassium but is still having cramping. Jaan has also had intermittent constipation over the last several weeks taking OTC medications to assist with this including mag citrate and miralax. Carvell is taking miralax almost daily. I advised him he needs to stress these symptoms to his PCP next week at this appointment, he agreed with plan.   Trevaris is concerned about transportation to this appointment, I will see what options we have for this.   [name] weight has fluctuated over the last few days as noted below:  8/14- 361lbs 8/15-367lbs 8/16-363lbs  Fredrico has been submitting his cardiomems readings but has had trouble doing so on his own, he has needed help with this by the help of his home aide daily to help. I advised him to contact the manufacture for further assistance or tips on how to successfully do so on his own.   Vitals and assessment as noted in report.   [name] legs look better this week with decrease in swelling and redness has decreased as well.  He has been wearing his compression socks daily.   Home visit complete. Thurmond aware  of his appointment tomorrow with the HF clinic at 0830. I will be going with him to this appointment. Transportation set up with [facility] and pick up time for 0745.   Refills: NONE        Future Appointments  Date Time Provider [place]  11/27/2020  8:30 AM [facility] PA/NP [facility] None     ACTION: Home visit completed

## 2020-11-27 ENCOUNTER — Ambulatory Visit (HOSPITAL_COMMUNITY)
Admission: RE | Admit: 2020-11-27 | Discharge: 2020-11-27 | Disposition: A | Discharge status: Home / Self Care | Payer: Medicare HMO | Source: Ambulatory Visit | Attending: Family Medicine | Admitting: Family Medicine

## 2020-11-27 ENCOUNTER — Encounter (HOSPITAL_COMMUNITY): Payer: Self-pay | Admitting: Adult Health

## 2020-11-27 ENCOUNTER — Other Ambulatory Visit (HOSPITAL_COMMUNITY): Payer: Self-pay

## 2020-11-27 ENCOUNTER — Encounter (HOSPITAL_COMMUNITY): Payer: Self-pay

## 2020-11-27 ENCOUNTER — Other Ambulatory Visit: Payer: Self-pay

## 2020-11-27 VITALS — BP 120/70 | HR 68 | Wt 369.0 lb

## 2020-11-27 DIAGNOSIS — F419 Anxiety disorder, unspecified: Secondary | ICD-10-CM | POA: Diagnosis not present

## 2020-11-27 DIAGNOSIS — L03119 Cellulitis of unspecified part of limb: Secondary | ICD-10-CM | POA: Insufficient documentation

## 2020-11-27 DIAGNOSIS — J449 Chronic obstructive pulmonary disease, unspecified: Secondary | ICD-10-CM | POA: Diagnosis not present

## 2020-11-27 DIAGNOSIS — I480 Paroxysmal atrial fibrillation: Secondary | ICD-10-CM | POA: Diagnosis not present

## 2020-11-27 DIAGNOSIS — Z8249 Family history of ischemic heart disease and other diseases of the circulatory system: Secondary | ICD-10-CM | POA: Diagnosis not present

## 2020-11-27 DIAGNOSIS — E669 Obesity, unspecified: Secondary | ICD-10-CM | POA: Diagnosis not present

## 2020-11-27 DIAGNOSIS — I2510 Atherosclerotic heart disease of native coronary artery without angina pectoris: Secondary | ICD-10-CM | POA: Diagnosis not present

## 2020-11-27 DIAGNOSIS — F32A Depression, unspecified: Secondary | ICD-10-CM | POA: Insufficient documentation

## 2020-11-27 DIAGNOSIS — Z79899 Other long term (current) drug therapy: Secondary | ICD-10-CM | POA: Diagnosis not present

## 2020-11-27 DIAGNOSIS — I5032 Chronic diastolic (congestive) heart failure: Secondary | ICD-10-CM

## 2020-11-27 DIAGNOSIS — Z09 Encounter for follow-up examination after completed treatment for conditions other than malignant neoplasm: Secondary | ICD-10-CM | POA: Insufficient documentation

## 2020-11-27 DIAGNOSIS — I5033 Acute on chronic diastolic (congestive) heart failure: Secondary | ICD-10-CM | POA: Insufficient documentation

## 2020-11-27 DIAGNOSIS — R0789 Other chest pain: Secondary | ICD-10-CM | POA: Insufficient documentation

## 2020-11-27 DIAGNOSIS — H8110 Benign paroxysmal vertigo, unspecified ear: Secondary | ICD-10-CM | POA: Insufficient documentation

## 2020-11-27 DIAGNOSIS — G4733 Obstructive sleep apnea (adult) (pediatric): Secondary | ICD-10-CM | POA: Insufficient documentation

## 2020-11-27 DIAGNOSIS — Z7901 Long term (current) use of anticoagulants: Secondary | ICD-10-CM | POA: Diagnosis not present

## 2020-11-27 DIAGNOSIS — Z86711 Personal history of pulmonary embolism: Secondary | ICD-10-CM | POA: Diagnosis not present

## 2020-11-27 DIAGNOSIS — Z872 Personal history of diseases of the skin and subcutaneous tissue: Secondary | ICD-10-CM

## 2020-11-27 DIAGNOSIS — I110 Hypertensive heart disease with heart failure: Secondary | ICD-10-CM | POA: Insufficient documentation

## 2020-11-27 DIAGNOSIS — I872 Venous insufficiency (chronic) (peripheral): Secondary | ICD-10-CM

## 2020-11-27 DIAGNOSIS — R42 Dizziness and giddiness: Secondary | ICD-10-CM

## 2020-11-27 DIAGNOSIS — Z86718 Personal history of other venous thrombosis and embolism: Secondary | ICD-10-CM | POA: Diagnosis not present

## 2020-11-27 DIAGNOSIS — I10 Essential (primary) hypertension: Secondary | ICD-10-CM

## 2020-11-27 DIAGNOSIS — R110 Nausea: Secondary | ICD-10-CM | POA: Diagnosis not present

## 2020-11-27 LAB — BASIC METABOLIC PANEL
Anion gap: 9 (ref 5–15)
BUN: 18 mg/dL (ref 8–23)
CO2: 37 mmol/L — ABNORMAL HIGH (ref 22–32)
Calcium: 9.4 mg/dL (ref 8.9–10.3)
Chloride: 88 mmol/L — ABNORMAL LOW (ref 98–111)
Creatinine, Ser: 1.11 mg/dL (ref 0.61–1.24)
GFR, Estimated: >60 mL/min (ref >60)
Glucose, Bld: 126 mg/dL — ABNORMAL HIGH (ref 70–99)
Potassium: 3.9 mmol/L (ref 3.5–5.1)
Sodium: 134 mmol/L — ABNORMAL LOW (ref 135–145)

## 2020-11-27 LAB — CBC
HCT: 39 % (ref 39.0–52.0)
Hemoglobin: 11.6 g/dL — ABNORMAL LOW (ref 13.0–17.0)
MCH: 23.6 pg — ABNORMAL LOW (ref 26.0–34.0)
MCHC: 29.7 g/dL — ABNORMAL LOW (ref 30.0–36.0)
MCV: 79.3 fL — ABNORMAL LOW (ref 80.0–100.0)
Platelets: 151 10*3/uL (ref 150–400)
RBC: 4.92 MIL/uL (ref 4.22–5.81)
RDW: 16.8 % — ABNORMAL HIGH (ref 11.5–15.5)
WBC: 9 10*3/uL (ref 4.0–10.5)
nRBC: 0 % (ref 0.0–0.2)

## 2020-11-27 NOTE — Progress Notes (Signed)
Paramedicine Encounter    Patient ID: Charles Gray, male    DOB: [birth year], 65 y.o.   MRN: [id number]  Met with Ovid in clinic today where he was seen by Janett Billow, NP. Janett Billow made no med changes. Obtained EKG and Labs today. Cardio-mems readings look good. Caroll continued to complain of dizziness, believed to be vertigo, Janett Billow provided [place] with inner ear exercises to try. Jeral thankful for the help.  Allan will see PCP next week and transportation set up and confirmed with Raquel Sarna for [employer]. They will call with pick up time for [place]. Clinic visit complete. I will see Justino in the home in one week.   ACTION: Home visit completed

## 2020-11-27 NOTE — Patient Instructions (Signed)
  It was great to see you today! No medication changes are needed at this time.  Labs today We will only contact you if something comes back abnormal or we need to make some changes. Otherwise no news is good news!  Your provider would like you to return for a follow up appointment in 6-8 weeks.   At the [facility], you and your health needs are our priority. As part of our continuing mission to provide you with exceptional heart care, we have created designated Provider Care Teams. These Care Teams include your primary Cardiologist (physician) and Advanced Practice Providers (APPs- Physician Assistants and Nurse Practitioners) who all work together to provide you with the care you need, when you need it.   You may see any of the following providers on your designated Care Team at your next follow up: Dr Glori Bickers Dr Loralie Champagne Dr Patrice Paradise, NP Lyda Jester, [place] Ginnie Smart Audry Riles, PharmD   Please be sure to bring in all your medications bottles to every appointment.

## 2020-11-27 NOTE — Progress Notes (Signed)
HF Paramedicine Team Based Care Meeting  HF MD- NA  HF NP - [place] NP-C   [place]   Had [place] placed 11/14/20   Hospital admit within the last 30 days for heart failure? Yes  Medications concerns? No   Transportation issues ? Con Transport   Education needs? Yes   SDOH - yes needs help with food and transportation.   Eligible for discharge?  No. Try keep out of hospital. Trying to get additional Aide hours.    Marven Veley NP-C  3:31 PM

## 2020-11-27 NOTE — Addendum Note (Signed)
Encounter addended by: Rafael Bihari, FNP on: 11/27/2020 9:26 AM  Actions taken: Clinical Note Signed, Follow-up modified, Medication List reviewed, Problem List reviewed, Allergies reviewed, Level of Service modified, Flowsheet accepted

## 2020-11-29 ENCOUNTER — Telehealth (HOSPITAL_COMMUNITY): Payer: Self-pay | Admitting: *Deleted

## 2020-11-29 NOTE — Telephone Encounter (Signed)
Angel w/[facility] left vm requesting return call about pt I called back no answer and vm full.   Callback # [phone]

## 2020-12-03 ENCOUNTER — Telehealth (HOSPITAL_COMMUNITY): Payer: Self-pay

## 2020-12-03 NOTE — Telephone Encounter (Signed)
I called Mr Charles Gray to advise him that Nira Conn would not be able to see him today due to a family emergency. I asked if I could go through his medications with him and direct him in filling his pillbox but he refused. I asked multiple times but he continued to say he "will figure it out." I confirmed that he had my phone number and asked that he call me back if he should have questions while filling his box. He was understanding and agreeable.   Charles Gray, EMT 12/03/20

## 2020-12-10 ENCOUNTER — Other Ambulatory Visit (HOSPITAL_COMMUNITY): Payer: Self-pay

## 2020-12-10 NOTE — Progress Notes (Signed)
Paramedicine Encounter    Patient ID: Charles Gray, male    DOB: Sep 09, 1955, 65 y.o.   MRN: [id number]   Patient Care Team: Enid Skeens., MD as PCP - General (Family Medicine) Berniece Salines, DO as PCP - Cardiology (Cardiology) Jorge Ny, LCSW as Social Worker (Licensed Clinical Social Worker)  Patient Active Problem List   Diagnosis Date Noted   Chronic diastolic (congestive) heart failure ([place]) 11/14/2020   Syncope 10/28/2020   CHF (congestive heart failure) ([place]) 10/28/2020   Chronic venous stasis dermatitis of both lower extremities 08/27/2020   Cellulitis of both lower extremities 08/14/2020   Microcytic anemia 08/14/2020   GERD without esophagitis 06/10/2020   Atrial fibrillation with rapid ventricular response ([place]) 03/24/2020   Acute on chronic right heart failure ([place]) 01/27/2020   Acute respiratory failure with hypoxia ([place])    Fatty liver disease, nonalcoholic    Shock ([place])    Personal history of DVT (deep vein thrombosis) 12/26/2019   Chronic anticoagulation 12/26/2019   Palpitations 12/26/2019   AF (paroxysmal atrial fibrillation) ([place]) 12/23/2019   Heart failure, systolic, acute ([place]) 61/95/0932   Acute on chronic congestive heart failure (HCC)    Chronic diastolic HF (heart failure) ([place]) 08/23/2019   Chest pain 08/23/2019   Lactic acidosis 08/23/2019   History of pulmonary embolism 08/12/2019   Atrial tachycardia ([place]) 08/12/2019   Occasional tremors 08/12/2019   OSA (obstructive sleep apnea) 08/12/2019   Acute on chronic right-sided heart failure ([place]) 08/06/2019   Pulmonary embolism ([place]) 06/08/2019   Normocytic anemia 06/08/2019   Pulmonary emboli ([place]) 06/08/2019   Chronic heart failure with preserved ejection fraction ([place]) 05/30/2019   Class 2 severe obesity due to excess calories with serious comorbidity and body mass index (BMI) of 39.0 to 39.9 in adult ([place]) 05/30/2019   Morbid obesity with BMI of 40.0-44.9, adult ([place]) 05/30/2019    Hyponatremia    Drug induced constipation    Peripheral edema    Hypotension due to drugs    Hypoalbuminemia due to protein-calorie malnutrition (HCC)    Hypokalemia    Thrombocytopenia (HCC)    Anemia of chronic disease    Cirrhosis of liver without ascites (HCC)    Chronic pain syndrome    Debility 03/13/2019   Portal hypertensive gastropathy ([place]) 03/06/2019   Dyspnea    GIB (gastrointestinal bleeding) 03/04/2019   Mixed hyperlipidemia 01/24/2019   Insomnia 01/24/2019   Hyperlipidemia 01/24/2019   Essential hypertension 01/24/2019   Lumbar disc herniation 01/24/2019    Current Outpatient Medications:    acetaminophen (TYLENOL) 500 MG tablet, Take 1,000 mg by mouth every 6 (six) hours as needed for moderate pain or headache., Disp: , Rfl:    apixaban (ELIQUIS) 5 MG TABS tablet, Take 1 tablet (5 mg total) by mouth 2 (two) times daily., Disp: 60 tablet, Rfl: 0   Buprenorphine HCl-Naloxone HCl 8-2 MG FILM, Place 1 tablet under the tongue 2 (two) times daily., Disp: , Rfl:    eplerenone (INSPRA) 50 MG tablet, Take 2 tablets (100 mg total) by mouth daily., Disp: 60 tablet, Rfl: 3   gabapentin (NEURONTIN) 800 MG tablet, Take 1 tablet (800 mg total) by mouth 3 (three) times daily., Disp: 90 tablet, Rfl: 1   metolazone (ZAROXOLYN) 2.5 MG tablet, Take 1 tablet (2.5 mg total) by mouth 3 (three) times a week. Monday, Wednesday and Fridays, Disp: 14 tablet, Rfl: 0   metoprolol succinate (TOPROL XL) 25 MG 24 hr tablet, Take 1 tablet (25 mg  total) by mouth 2 (two) times daily., Disp: 60 tablet, Rfl: 2   ondansetron (ZOFRAN-ODT) 4 MG disintegrating tablet, TAKE 1 TABLET BY MOUTH EVERY 8 HOURS AS NEEDED FOR NAUSEA OR VOMITING, Disp: 20 tablet, Rfl: 0   pantoprazole (PROTONIX) 40 MG tablet, Take 40 mg by mouth 2 (two) times daily., Disp: , Rfl:    polyethylene glycol powder (GLYCOLAX/MIRALAX) 17 GM/SCOOP powder, Take 17 g by mouth daily. (Patient taking differently: Take 17 g by mouth daily as needed  for mild constipation or moderate constipation.), Disp: 255 g, Rfl: 0   potassium chloride SA (KLOR-CON) 20 MEQ tablet, Take 4 tablets (80 mEq total) by mouth 2 (two) times daily. Extra 40 mEq with metolazone days., Disp: 260 tablet, Rfl: 0   pravastatin (PRAVACHOL) 20 MG tablet, Take 1 tablet (20 mg total) by mouth daily., Disp: 30 tablet, Rfl: 1   sertraline (ZOLOFT) 25 MG tablet, TAKE 1 TABLET BY MOUTH DAILY, Disp: 30 tablet, Rfl: 0   sodium chloride (OCEAN) 0.65 % SOLN nasal spray, Place 1 spray into both nostrils as needed for congestion., Disp: , Rfl:    tamsulosin (FLOMAX) 0.4 MG CAPS capsule, Take 1 capsule (0.4 mg total) by mouth daily., Disp: 30 capsule, Rfl: 0   torsemide (DEMADEX) 100 MG tablet, Take 1 tablet (100 mg total) by mouth 2 (two) times daily., Disp: 60 tablet, Rfl: 3   VENTOLIN HFA 108 (90 Base) MCG/ACT inhaler, Inhale 1 puff into the lungs every 4 (four) hours as needed for shortness of breath., Disp: 6.7 g, Rfl: 1   zolpidem (AMBIEN) 10 MG tablet, Take 10 mg by mouth at bedtime., Disp: , Rfl:  Allergies  Allergen Reactions   Penicillins     Broke out in convulsions as a child  Did it involve swelling of the face/tongue/throat, SOB, or low BP? N/A Did it involve sudden or severe rash/hives, skin peeling, or any reaction on the inside of your mouth or nose? N/A Did you need to seek medical attention at a hospital or doctor's office?N/A When did it last happen? N/A If all above answers are "NO", may proceed with cephalosporin use.     Social History   Socioeconomic History   Marital status: Single    Spouse name: Not on file   Number of children: Not on file   Years of education: Not on file   Highest education level: Not on file  Occupational History   Not on file  Tobacco Use   Smoking status: Never   Smokeless tobacco: Never  Vaping Use   Vaping Use: Never used  Substance and Sexual Activity   Alcohol use: Not Currently   Drug use: Not Currently     Types: Marijuana    Comment: Smoked for 30 years   Sexual activity: Not on file  Other Topics Concern   Not on file  Social History Narrative   Not on file   Social Determinants of Health   Financial Resource Strain: Medium Risk   Difficulty of Paying Living Expenses: Somewhat hard  Food Insecurity: No Food Insecurity   Worried About Running Out of Food in the Last Year: Never true   Ran Out of Food in the Last Year: Never true  Transportation Needs: Unmet Transportation Needs   Lack of Transportation (Medical): Yes   Lack of Transportation (Non-Medical): No  Physical Activity: Not on file  Stress: Not on file  Social Connections: Not on file  Intimate Partner Violence: Not on file  Physical Exam Vitals reviewed.  Constitutional:      Appearance: Normal appearance. He is obese.  HENT:     Head: Normocephalic.     Nose: Nose normal.     Mouth/Throat:     Mouth: Mucous membranes are moist.     Pharynx: Oropharynx is clear.  Eyes:     Pupils: Pupils are equal, round, and reactive to light.  Cardiovascular:     Rate and Rhythm: Normal rate and regular rhythm.     Pulses: Normal pulses.     Heart sounds: Normal heart sounds.  Pulmonary:     Effort: Pulmonary effort is normal.     Breath sounds: Normal breath sounds.  Abdominal:     General: Abdomen is flat.     Palpations: Abdomen is soft.  Musculoskeletal:        General: Swelling present. Normal range of motion.     Cervical back: Normal range of motion.     Right lower leg: Edema present.     Left lower leg: Edema present.  Skin:    General: Skin is warm and dry.     Capillary Refill: Capillary refill takes less than 2 seconds.  Neurological:     General: No focal deficit present.     Mental Status: He is alert. Mental status is at baseline.  Psychiatric:        Mood and Affect: Mood normal.    Arrived for home visit for Charles Gray who was alert and oriented seated in his recliner. Charles Gray reports feeling okay  today but states he has gained some weight and has some swelling in his feet with some increased fatigue and shortness of breath when walking. Charles Gray stated he has been submitted CardioMems readings daily.    I obtained vitals and assessment as noted. BLE noted with redness to the tops of his feet. He states he has been using his compression socks but not daily. I reminded him to do so.   CardioMems reading per Charles Gray CMA was below average.   Medications were verified and pill box filled accordingly.   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   Charles Gray saw his PCP a week ago and had no news to report.   I reminded Charles Gray to limit his fluid intake and salt intake, he understood and agreed to do better.   I will see Charles Gray in one week.   Refills: Potassium Flomax   Future Appointments  Date Time Provider [place]  01/10/2021 10:00 AM [facility] PA/NP [facility] None     ACTION: Home visit completed

## 2020-12-11 ENCOUNTER — Telehealth (HOSPITAL_COMMUNITY): Payer: Self-pay

## 2020-12-11 NOTE — Telephone Encounter (Signed)
Received a call from Toshio's neighbor who reports he is confused this morning and not feeling well.   I spoke to [facility] who was alert and oriented to my questioning but was taking longer to answer questions. He stated he did not sleep much last night.   The physical therapist Elta Guadeloupe arrived during our phone call and stated he would call 911 if needed. Kaelin reports he is fine and just needs some rest. I encouraged Fay to complete PT and to call me if anything worsened or changed. He agreed with plan. Mark PT, has my phone number if needed. Call complete.

## 2020-12-13 ENCOUNTER — Telehealth (HOSPITAL_COMMUNITY): Payer: Self-pay

## 2020-12-13 NOTE — Telephone Encounter (Signed)
Refills called into [facility]:  -Flomax

## 2020-12-16 ENCOUNTER — Encounter (HOSPITAL_COMMUNITY): Payer: Self-pay

## 2020-12-17 ENCOUNTER — Other Ambulatory Visit (HOSPITAL_COMMUNITY): Payer: Self-pay

## 2020-12-17 NOTE — Telephone Encounter (Signed)
Routed to [facility] please advise.

## 2020-12-17 NOTE — Progress Notes (Signed)
Paramedicine Encounter    Patient ID: Charles Gray, male    DOB: May 01, 1955, 65 y.o.   MRN: [id number]   Patient Care Team: Enid Skeens., MD as PCP - General (Family Medicine) Berniece Salines, DO as PCP - Cardiology (Cardiology) Jorge Ny, LCSW as Social Worker (Licensed Clinical Social Worker)  Patient Active Problem List   Diagnosis Date Noted   Chronic diastolic (congestive) heart failure ([place]) 11/14/2020   Syncope 10/28/2020   CHF (congestive heart failure) ([place]) 10/28/2020   Chronic venous stasis dermatitis of both lower extremities 08/27/2020   Cellulitis of both lower extremities 08/14/2020   Microcytic anemia 08/14/2020   GERD without esophagitis 06/10/2020   Atrial fibrillation with rapid ventricular response ([place]) 03/24/2020   Acute on chronic right heart failure ([place]) 01/27/2020   Acute respiratory failure with hypoxia ([place])    Fatty liver disease, nonalcoholic    Shock ([place])    Personal history of DVT (deep vein thrombosis) 12/26/2019   Chronic anticoagulation 12/26/2019   Palpitations 12/26/2019   AF (paroxysmal atrial fibrillation) ([place]) 12/23/2019   Heart failure, systolic, acute ([place]) 85/05/7739   Acute on chronic congestive heart failure (HCC)    Chronic diastolic HF (heart failure) ([place]) 08/23/2019   Chest pain 08/23/2019   Lactic acidosis 08/23/2019   History of pulmonary embolism 08/12/2019   Atrial tachycardia ([place]) 08/12/2019   Occasional tremors 08/12/2019   OSA (obstructive sleep apnea) 08/12/2019   Acute on chronic right-sided heart failure ([place]) 08/06/2019   Pulmonary embolism ([place]) 06/08/2019   Normocytic anemia 06/08/2019   Pulmonary emboli ([place]) 06/08/2019   Chronic heart failure with preserved ejection fraction ([place]) 05/30/2019   Class 2 severe obesity due to excess calories with serious comorbidity and body mass index (BMI) of 39.0 to 39.9 in adult ([place]) 05/30/2019   Morbid obesity with BMI of 40.0-44.9, adult ([place]) 05/30/2019    Hyponatremia    Drug induced constipation    Peripheral edema    Hypotension due to drugs    Hypoalbuminemia due to protein-calorie malnutrition (HCC)    Hypokalemia    Thrombocytopenia (HCC)    Anemia of chronic disease    Cirrhosis of liver without ascites (HCC)    Chronic pain syndrome    Debility 03/13/2019   Portal hypertensive gastropathy ([place]) 03/06/2019   Dyspnea    GIB (gastrointestinal bleeding) 03/04/2019   Mixed hyperlipidemia 01/24/2019   Insomnia 01/24/2019   Hyperlipidemia 01/24/2019   Essential hypertension 01/24/2019   Lumbar disc herniation 01/24/2019    Current Outpatient Medications:    acetaminophen (TYLENOL) 500 MG tablet, Take 1,000 mg by mouth every 6 (six) hours as needed for moderate pain or headache., Disp: , Rfl:    apixaban (ELIQUIS) 5 MG TABS tablet, Take 1 tablet (5 mg total) by mouth 2 (two) times daily., Disp: 60 tablet, Rfl: 0   Buprenorphine HCl-Naloxone HCl 8-2 MG FILM, Place 1 tablet under the tongue 2 (two) times daily., Disp: , Rfl:    eplerenone (INSPRA) 50 MG tablet, Take 2 tablets (100 mg total) by mouth daily., Disp: 60 tablet, Rfl: 3   gabapentin (NEURONTIN) 800 MG tablet, Take 1 tablet (800 mg total) by mouth 3 (three) times daily., Disp: 90 tablet, Rfl: 1   metolazone (ZAROXOLYN) 2.5 MG tablet, Take 1 tablet (2.5 mg total) by mouth 3 (three) times a week. Monday, Wednesday and Fridays, Disp: 14 tablet, Rfl: 0   metoprolol succinate (TOPROL XL) 25 MG 24 hr tablet, Take 1 tablet (25 mg  total) by mouth 2 (two) times daily., Disp: 60 tablet, Rfl: 2   ondansetron (ZOFRAN-ODT) 4 MG disintegrating tablet, TAKE 1 TABLET BY MOUTH EVERY 8 HOURS AS NEEDED FOR NAUSEA OR VOMITING, Disp: 20 tablet, Rfl: 0   pantoprazole (PROTONIX) 40 MG tablet, Take 40 mg by mouth 2 (two) times daily., Disp: , Rfl:    polyethylene glycol powder (GLYCOLAX/MIRALAX) 17 GM/SCOOP powder, Take 17 g by mouth daily. (Patient taking differently: Take 17 g by mouth daily as needed  for mild constipation or moderate constipation.), Disp: 255 g, Rfl: 0   potassium chloride SA (KLOR-CON) 20 MEQ tablet, Take 4 tablets (80 mEq total) by mouth 2 (two) times daily. Extra 40 mEq with metolazone days., Disp: 260 tablet, Rfl: 0   pravastatin (PRAVACHOL) 20 MG tablet, Take 1 tablet (20 mg total) by mouth daily., Disp: 30 tablet, Rfl: 1   sertraline (ZOLOFT) 25 MG tablet, TAKE 1 TABLET BY MOUTH DAILY, Disp: 30 tablet, Rfl: 0   sodium chloride (OCEAN) 0.65 % SOLN nasal spray, Place 1 spray into both nostrils as needed for congestion., Disp: , Rfl:    tamsulosin (FLOMAX) 0.4 MG CAPS capsule, Take 1 capsule (0.4 mg total) by mouth daily., Disp: 30 capsule, Rfl: 0   torsemide (DEMADEX) 100 MG tablet, Take 1 tablet (100 mg total) by mouth 2 (two) times daily., Disp: 60 tablet, Rfl: 3   VENTOLIN HFA 108 (90 Base) MCG/ACT inhaler, Inhale 1 puff into the lungs every 4 (four) hours as needed for shortness of breath., Disp: 6.7 g, Rfl: 1   zolpidem (AMBIEN) 10 MG tablet, Take 10 mg by mouth at bedtime., Disp: , Rfl:  Allergies  Allergen Reactions   Penicillins     Broke out in convulsions as a child  Did it involve swelling of the face/tongue/throat, SOB, or low BP? N/A Did it involve sudden or severe rash/hives, skin peeling, or any reaction on the inside of your mouth or nose? N/A Did you need to seek medical attention at a hospital or doctor's office?N/A When did it last happen? N/A If all above answers are "NO", may proceed with cephalosporin use.     Social History   Socioeconomic History   Marital status: Single    Spouse name: Not on file   Number of children: Not on file   Years of education: Not on file   Highest education level: Not on file  Occupational History   Not on file  Tobacco Use   Smoking status: Never   Smokeless tobacco: Never  Vaping Use   Vaping Use: Never used  Substance and Sexual Activity   Alcohol use: Not Currently   Drug use: Not Currently     Types: Marijuana    Comment: Smoked for 30 years   Sexual activity: Not on file  Other Topics Concern   Not on file  Social History Narrative   Not on file   Social Determinants of Health   Financial Resource Strain: Medium Risk   Difficulty of Paying Living Expenses: Somewhat hard  Food Insecurity: No Food Insecurity   Worried About Running Out of Food in the Last Year: Never true   Ran Out of Food in the Last Year: Never true  Transportation Needs: Unmet Transportation Needs   Lack of Transportation (Medical): Yes   Lack of Transportation (Non-Medical): No  Physical Activity: Not on file  Stress: Not on file  Social Connections: Not on file  Intimate Partner Violence: Not on file  Physical Exam Vitals reviewed.  Constitutional:      Appearance: Normal appearance. He is obese.  HENT:     Head: Normocephalic.     Nose: Nose normal.     Mouth/Throat:     Mouth: Mucous membranes are moist.     Pharynx: Oropharynx is clear.  Eyes:     Conjunctiva/sclera: Conjunctivae normal.     Pupils: Pupils are equal, round, and reactive to light.  Cardiovascular:     Rate and Rhythm: Normal rate and regular rhythm.     Pulses: Normal pulses.     Heart sounds: Normal heart sounds.  Pulmonary:     Effort: Pulmonary effort is normal.     Breath sounds: Normal breath sounds.  Abdominal:     General: Abdomen is flat.     Palpations: Abdomen is soft.  Musculoskeletal:        General: Swelling present. Normal range of motion.     Cervical back: Normal range of motion.     Right lower leg: Edema present.     Left lower leg: Edema present.  Skin:    General: Skin is warm and dry.     Capillary Refill: Capillary refill takes less than 2 seconds.  Neurological:     General: No focal deficit present.     Mental Status: He is alert. Mental status is at baseline.  Psychiatric:        Mood and Affect: Mood normal.    Arrived for home visit for Basil who was alert and oriented  reporting to be feeling "bad". Chi said his weight is still 372lbs and he is getting short of breath in the evenings. Lower legs are swollen but look better than they have in the past. Tops of feet are puffy but no signs of cellulitis or extreme edema at present. Filippo has been compliant with taking his medications but admits to snacking a lot. I saw jars of pickles and olives in his trash can along with bags of cheetos and popcorn. I educated Keion on the importance of avoiding salty foods with his medical conditions and he reports he understands. Gagan is presently still getting [employer] but snacking in between.   I obtained vitals as noted.  I reviewed medications and confirmed same filling one week of pill box for Dezmin.  Pillbox missing FLOMAX due to lack of refills.  (Same should be ready today or tomorrow)    We reviewed cardiomems and submitted reading for pillow. Shaarav has been doing same on his own successfully.   Appointments reviewed and confirmed.   I will see Demetries in the home in one week. Home visit complete.   -we discussed dietary management, exercise and utilizing compression stockings.   -[facility] has PT and RN coming out as well as a home aide daily.   Refills: Zofran Flomax [place]    Future Appointments  Date Time Provider [facility]  01/10/2021 10:00 AM [facility] PA/NP [facility] None     ACTION: Home visit completed

## 2020-12-18 ENCOUNTER — Telehealth (HOSPITAL_COMMUNITY): Payer: Self-pay

## 2020-12-18 NOTE — Telephone Encounter (Signed)
[facility] called for lab appointment for Friday at 10:30. They will be calling the patient to set up a ride pick up time. Cohl made aware of same.

## 2020-12-18 NOTE — Telephone Encounter (Signed)
Pt said he would call Heather w/paramedicine to coordinate lab work and transportation

## 2020-12-20 ENCOUNTER — Ambulatory Visit (HOSPITAL_COMMUNITY)
Admission: RE | Admit: 2020-12-20 | Discharge: 2020-12-20 | Disposition: A | Discharge status: Home / Self Care | Payer: Medicare HMO | Source: Ambulatory Visit | Attending: Internal Medicine | Admitting: Internal Medicine

## 2020-12-20 ENCOUNTER — Other Ambulatory Visit: Payer: Self-pay

## 2020-12-20 ENCOUNTER — Other Ambulatory Visit (HOSPITAL_COMMUNITY): Payer: Self-pay | Admitting: Cardiology

## 2020-12-20 DIAGNOSIS — I5032 Chronic diastolic (congestive) heart failure: Secondary | ICD-10-CM | POA: Insufficient documentation

## 2020-12-20 LAB — BASIC METABOLIC PANEL
Anion gap: 9 (ref 5–15)
BUN: 13 mg/dL (ref 8–23)
CO2: 32 mmol/L (ref 22–32)
Calcium: 9 mg/dL (ref 8.9–10.3)
Chloride: 95 mmol/L — ABNORMAL LOW (ref 98–111)
Creatinine, Ser: 0.85 mg/dL (ref 0.61–1.24)
GFR, Estimated: >60 mL/min (ref >60)
Glucose, Bld: 121 mg/dL — ABNORMAL HIGH (ref 70–99)
Potassium: 3.1 mmol/L — ABNORMAL LOW (ref 3.5–5.1)
Sodium: 136 mmol/L (ref 135–145)

## 2020-12-21 ENCOUNTER — Encounter (HOSPITAL_COMMUNITY): Payer: Self-pay

## 2020-12-21 ENCOUNTER — Observation Stay (HOSPITAL_COMMUNITY)
Admission: EM | Admit: 2020-12-21 | Discharge: 2020-12-23 | Disposition: A | Discharge status: Home / Self Care | Payer: Medicare HMO | Attending: Internal Medicine | Admitting: Internal Medicine

## 2020-12-21 ENCOUNTER — Emergency Department (HOSPITAL_COMMUNITY): Payer: Medicare HMO

## 2020-12-21 ENCOUNTER — Other Ambulatory Visit: Payer: Self-pay

## 2020-12-21 ENCOUNTER — Encounter (HOSPITAL_COMMUNITY): Payer: Self-pay | Admitting: *Deleted

## 2020-12-21 DIAGNOSIS — Z7901 Long term (current) use of anticoagulants: Secondary | ICD-10-CM | POA: Insufficient documentation

## 2020-12-21 DIAGNOSIS — Z86711 Personal history of pulmonary embolism: Secondary | ICD-10-CM | POA: Diagnosis present

## 2020-12-21 DIAGNOSIS — I5033 Acute on chronic diastolic (congestive) heart failure: Secondary | ICD-10-CM | POA: Diagnosis present

## 2020-12-21 DIAGNOSIS — J441 Chronic obstructive pulmonary disease with (acute) exacerbation: Secondary | ICD-10-CM | POA: Diagnosis present

## 2020-12-21 DIAGNOSIS — I5032 Chronic diastolic (congestive) heart failure: Secondary | ICD-10-CM | POA: Diagnosis present

## 2020-12-21 DIAGNOSIS — Z79899 Other long term (current) drug therapy: Secondary | ICD-10-CM | POA: Diagnosis not present

## 2020-12-21 DIAGNOSIS — G9340 Encephalopathy, unspecified: Secondary | ICD-10-CM | POA: Diagnosis not present

## 2020-12-21 DIAGNOSIS — K7460 Unspecified cirrhosis of liver: Secondary | ICD-10-CM | POA: Diagnosis present

## 2020-12-21 DIAGNOSIS — G894 Chronic pain syndrome: Secondary | ICD-10-CM | POA: Diagnosis present

## 2020-12-21 DIAGNOSIS — Y900 Blood alcohol level of less than 20 mg/100 ml: Secondary | ICD-10-CM | POA: Diagnosis not present

## 2020-12-21 DIAGNOSIS — J449 Chronic obstructive pulmonary disease, unspecified: Secondary | ICD-10-CM | POA: Diagnosis not present

## 2020-12-21 DIAGNOSIS — E876 Hypokalemia: Secondary | ICD-10-CM

## 2020-12-21 DIAGNOSIS — D638 Anemia in other chronic diseases classified elsewhere: Secondary | ICD-10-CM | POA: Diagnosis present

## 2020-12-21 DIAGNOSIS — R079 Chest pain, unspecified: Secondary | ICD-10-CM | POA: Diagnosis present

## 2020-12-21 DIAGNOSIS — I110 Hypertensive heart disease with heart failure: Secondary | ICD-10-CM | POA: Diagnosis not present

## 2020-12-21 DIAGNOSIS — I480 Paroxysmal atrial fibrillation: Secondary | ICD-10-CM | POA: Diagnosis present

## 2020-12-21 DIAGNOSIS — I509 Heart failure, unspecified: Secondary | ICD-10-CM

## 2020-12-21 DIAGNOSIS — R531 Weakness: Secondary | ICD-10-CM | POA: Diagnosis present

## 2020-12-21 DIAGNOSIS — Z20822 Contact with and (suspected) exposure to covid-19: Secondary | ICD-10-CM | POA: Diagnosis not present

## 2020-12-21 DIAGNOSIS — Z86718 Personal history of other venous thrombosis and embolism: Secondary | ICD-10-CM

## 2020-12-21 LAB — TSH: TSH: 2.475 u[IU]/mL (ref 0.350–4.500)

## 2020-12-21 LAB — RESP PANEL BY RT-PCR (FLU A&B, COVID) ARPGX2
Influenza A by PCR: NEGATIVE
Influenza B by PCR: NEGATIVE
SARS Coronavirus 2 by RT PCR: NEGATIVE

## 2020-12-21 LAB — CBC WITH DIFFERENTIAL/PLATELET
Abs Immature Granulocytes: 0.03 10*3/uL (ref 0.00–0.07)
Basophils Absolute: 0 10*3/uL (ref 0.0–0.1)
Basophils Relative: 0 %
Eosinophils Absolute: 0.1 10*3/uL (ref 0.0–0.5)
Eosinophils Relative: 1 %
HCT: 36.1 % — ABNORMAL LOW (ref 39.0–52.0)
Hemoglobin: 10.8 g/dL — ABNORMAL LOW (ref 13.0–17.0)
Immature Granulocytes: 0 %
Lymphocytes Relative: 10 %
Lymphs Abs: 0.8 10*3/uL (ref 0.7–4.0)
MCH: 23.6 pg — ABNORMAL LOW (ref 26.0–34.0)
MCHC: 29.9 g/dL — ABNORMAL LOW (ref 30.0–36.0)
MCV: 78.8 fL — ABNORMAL LOW (ref 80.0–100.0)
Monocytes Absolute: 0.5 10*3/uL (ref 0.1–1.0)
Monocytes Relative: 6 %
Neutro Abs: 7 10*3/uL (ref 1.7–7.7)
Neutrophils Relative %: 83 %
Platelets: 158 10*3/uL (ref 150–400)
RBC: 4.58 MIL/uL (ref 4.22–5.81)
RDW: 16.9 % — ABNORMAL HIGH (ref 11.5–15.5)
WBC: 8.4 10*3/uL (ref 4.0–10.5)
nRBC: 0 % (ref 0.0–0.2)

## 2020-12-21 LAB — I-STAT VENOUS BLOOD GAS, ED
Acid-Base Excess: 11 mmol/L — ABNORMAL HIGH (ref 0.0–2.0)
Bicarbonate: 37.3 mmol/L — ABNORMAL HIGH (ref 20.0–28.0)
Calcium, Ion: 1.17 mmol/L (ref 1.15–1.40)
HCT: 35 % — ABNORMAL LOW (ref 39.0–52.0)
Hemoglobin: 11.9 g/dL — ABNORMAL LOW (ref 13.0–17.0)
O2 Saturation: 31 %
Patient temperature: 98.6
Potassium: 3.8 mmol/L (ref 3.5–5.1)
Sodium: 138 mmol/L (ref 135–145)
TCO2: 39 mmol/L — ABNORMAL HIGH (ref 22–32)
pCO2, Ven: 56.2 mmHg (ref 44.0–60.0)
pH, Ven: 7.431 — ABNORMAL HIGH (ref 7.250–7.430)
pO2, Ven: 20 mmHg — CL (ref 32.0–45.0)

## 2020-12-21 LAB — RAPID URINE DRUG SCREEN, HOSP PERFORMED
Amphetamines: NOT DETECTED
Barbiturates: NOT DETECTED
Benzodiazepines: NOT DETECTED
Cocaine: NOT DETECTED
Opiates: NOT DETECTED
Tetrahydrocannabinol: NOT DETECTED

## 2020-12-21 LAB — PROTIME-INR
INR: 1.3 — ABNORMAL HIGH (ref 0.8–1.2)
Prothrombin Time: 16.1 seconds — ABNORMAL HIGH (ref 11.4–15.2)

## 2020-12-21 LAB — COMPREHENSIVE METABOLIC PANEL
ALT: 17 U/L (ref 0–44)
AST: 28 U/L (ref 15–41)
Albumin: 3.2 g/dL — ABNORMAL LOW (ref 3.5–5.0)
Alkaline Phosphatase: 71 U/L (ref 38–126)
Anion gap: 12 (ref 5–15)
BUN: 14 mg/dL (ref 8–23)
CO2: 28 mmol/L (ref 22–32)
Calcium: 8.9 mg/dL (ref 8.9–10.3)
Chloride: 95 mmol/L — ABNORMAL LOW (ref 98–111)
Creatinine, Ser: 0.87 mg/dL (ref 0.61–1.24)
GFR, Estimated: >60 mL/min (ref >60)
Glucose, Bld: 132 mg/dL — ABNORMAL HIGH (ref 70–99)
Potassium: 3.2 mmol/L — ABNORMAL LOW (ref 3.5–5.1)
Sodium: 135 mmol/L (ref 135–145)
Total Bilirubin: 1.2 mg/dL (ref 0.3–1.2)
Total Protein: 7.1 g/dL (ref 6.5–8.1)

## 2020-12-21 LAB — CBG MONITORING, ED: Glucose-Capillary: 115 mg/dL — ABNORMAL HIGH (ref 70–99)

## 2020-12-21 LAB — URINALYSIS, ROUTINE W REFLEX MICROSCOPIC
Bilirubin Urine: NEGATIVE
Glucose, UA: NEGATIVE mg/dL
Hgb urine dipstick: NEGATIVE
Ketones, ur: NEGATIVE mg/dL
Leukocytes,Ua: NEGATIVE
Nitrite: NEGATIVE
Protein, ur: NEGATIVE mg/dL
Specific Gravity, Urine: 1.01 (ref 1.005–1.030)
pH: 6.5 (ref 5.0–8.0)

## 2020-12-21 LAB — ETHANOL: Alcohol, Ethyl (B): <10 mg/dL (ref <10)

## 2020-12-21 LAB — RPR: RPR Ser Ql: NONREACTIVE

## 2020-12-21 LAB — TROPONIN I (HIGH SENSITIVITY)
Troponin I (High Sensitivity): 6 ng/L (ref <18)
Troponin I (High Sensitivity): 9 ng/L (ref <18)

## 2020-12-21 LAB — BRAIN NATRIURETIC PEPTIDE: B Natriuretic Peptide: 27.2 pg/mL (ref 0.0–100.0)

## 2020-12-21 LAB — CORTISOL: Cortisol, Plasma: 7.9 ug/dL

## 2020-12-21 LAB — MAGNESIUM: Magnesium: 2.2 mg/dL (ref 1.7–2.4)

## 2020-12-21 LAB — VITAMIN B12: Vitamin B-12: 399 pg/mL (ref 180–914)

## 2020-12-21 LAB — AMMONIA: Ammonia: 44 umol/L — ABNORMAL HIGH (ref 9–35)

## 2020-12-21 MED ORDER — ALBUTEROL SULFATE (2.5 MG/3ML) 0.083% IN NEBU: 2.5000 mg | INHALATION_SOLUTION | RESPIRATORY_TRACT | Status: DC | PRN

## 2020-12-21 MED ORDER — TORSEMIDE 100 MG PO TABS
100.0000 mg | ORAL_TABLET | Freq: Two times a day (BID) | ORAL | Status: DC
  Administered 2020-12-21 – 2020-12-23 (×5): 100 mg via ORAL
  Filled 2020-12-21 (×2): qty 1
  Filled 2020-12-21: qty 5
  Filled 2020-12-21 (×3): qty 1

## 2020-12-21 MED ORDER — ACETAMINOPHEN 650 MG RE SUPP: 650.0000 mg | Freq: Four times a day (QID) | RECTAL | Status: DC | PRN

## 2020-12-21 MED ORDER — TAMSULOSIN HCL 0.4 MG PO CAPS
0.4000 mg | ORAL_CAPSULE | Freq: Every day | ORAL | Status: DC
  Administered 2020-12-21 – 2020-12-23 (×3): 0.4 mg via ORAL
  Filled 2020-12-21 (×3): qty 1

## 2020-12-21 MED ORDER — POLYETHYLENE GLYCOL 3350 17 G PO PACK: 17.0000 g | PACK | Freq: Every day | ORAL | Status: DC | PRN

## 2020-12-21 MED ORDER — LACTULOSE 10 GM/15ML PO SOLN
20.0000 g | Freq: Once | ORAL | Status: AC
  Administered 2020-12-21: 20 g via ORAL
  Filled 2020-12-21: qty 30

## 2020-12-21 MED ORDER — METOLAZONE 2.5 MG PO TABS
2.5000 mg | ORAL_TABLET | ORAL | Status: DC
  Administered 2020-12-23: 2.5 mg via ORAL
  Filled 2020-12-21: qty 1

## 2020-12-21 MED ORDER — SERTRALINE HCL 50 MG PO TABS: 25.0000 mg | ORAL_TABLET | Freq: Every day | ORAL | Status: DC

## 2020-12-21 MED ORDER — ONDANSETRON HCL 4 MG PO TABS: 4.0000 mg | ORAL_TABLET | Freq: Four times a day (QID) | ORAL | Status: DC | PRN

## 2020-12-21 MED ORDER — POTASSIUM CHLORIDE CRYS ER 20 MEQ PO TBCR
40.0000 meq | EXTENDED_RELEASE_TABLET | Freq: Three times a day (TID) | ORAL | Status: DC
  Administered 2020-12-21 (×3): 40 meq via ORAL
  Filled 2020-12-21 (×3): qty 2

## 2020-12-21 MED ORDER — POTASSIUM CHLORIDE CRYS ER 20 MEQ PO TBCR
40.0000 meq | EXTENDED_RELEASE_TABLET | ORAL | Status: DC
  Administered 2020-12-23: 40 meq via ORAL
  Filled 2020-12-21: qty 2

## 2020-12-21 MED ORDER — BUPRENORPHINE HCL 8 MG SL SUBL
8.0000 mg | SUBLINGUAL_TABLET | Freq: Every day | SUBLINGUAL | Status: DC
  Administered 2020-12-21 – 2020-12-23 (×3): 8 mg via SUBLINGUAL
  Filled 2020-12-21 (×3): qty 1

## 2020-12-21 MED ORDER — ACETAMINOPHEN 325 MG PO TABS: 650.0000 mg | ORAL_TABLET | Freq: Four times a day (QID) | ORAL | Status: DC | PRN

## 2020-12-21 MED ORDER — POTASSIUM CHLORIDE 10 MEQ/100ML IV SOLN: 10.0000 meq | INTRAVENOUS | Status: AC

## 2020-12-21 MED ORDER — PRAVASTATIN SODIUM 10 MG PO TABS
20.0000 mg | ORAL_TABLET | Freq: Every day | ORAL | Status: DC
  Administered 2020-12-21 – 2020-12-23 (×3): 20 mg via ORAL
  Filled 2020-12-21 (×3): qty 2

## 2020-12-21 MED ORDER — PANTOPRAZOLE SODIUM 40 MG PO TBEC
40.0000 mg | DELAYED_RELEASE_TABLET | Freq: Two times a day (BID) | ORAL | Status: DC
  Administered 2020-12-21 – 2020-12-23 (×5): 40 mg via ORAL
  Filled 2020-12-21 (×5): qty 1

## 2020-12-21 MED ORDER — SPIRONOLACTONE 25 MG PO TABS
25.0000 mg | ORAL_TABLET | Freq: Every day | ORAL | Status: DC
  Administered 2020-12-21 – 2020-12-23 (×3): 25 mg via ORAL
  Filled 2020-12-21 (×3): qty 1

## 2020-12-21 MED ORDER — METOPROLOL SUCCINATE ER 25 MG PO TB24
25.0000 mg | ORAL_TABLET | Freq: Two times a day (BID) | ORAL | Status: DC
  Administered 2020-12-21 – 2020-12-23 (×5): 25 mg via ORAL
  Filled 2020-12-21 (×5): qty 1

## 2020-12-21 MED ORDER — POTASSIUM CHLORIDE 10 MEQ/100ML IV SOLN
INTRAVENOUS | Status: AC
  Filled 2020-12-21: qty 200

## 2020-12-21 MED ORDER — APIXABAN 5 MG PO TABS
5.0000 mg | ORAL_TABLET | Freq: Two times a day (BID) | ORAL | Status: DC
  Administered 2020-12-21 – 2020-12-23 (×5): 5 mg via ORAL
  Filled 2020-12-21 (×5): qty 1

## 2020-12-21 MED ORDER — ONDANSETRON HCL 4 MG/2ML IJ SOLN
4.0000 mg | Freq: Four times a day (QID) | INTRAMUSCULAR | Status: DC | PRN
  Administered 2020-12-22: 4 mg via INTRAVENOUS
  Filled 2020-12-21: qty 2

## 2020-12-21 MED ORDER — MELATONIN 5 MG PO TABS
5.0000 mg | ORAL_TABLET | Freq: Every day | ORAL | Status: DC
  Administered 2020-12-22: 5 mg via ORAL
  Filled 2020-12-21 (×2): qty 1

## 2020-12-21 NOTE — ED Notes (Signed)
Attempted to give report. Floor RN unavailable to take report at this time due pt care. Will call back

## 2020-12-21 NOTE — ED Triage Notes (Signed)
Pt to ED by [facility] for CP and weakness x 3 days. Neighbor called ems after pt was too weak to get off the toiler. Pt has BLE edema. ALso reports nausea. Pt is on two different diuretics and potassium replacement at home

## 2020-12-21 NOTE — H&P (Signed)
History and Physical    Charles Gray ZOX:[id number] DOB: 03-15-1956 DOA: 12/21/2020  PCP: Enid Skeens., MD   Patient coming from: Home   Chief Complaint: Somnolence, confusion   HPI: Charles Gray is a 65 y.o. male with medical history significant for OSA with CPAP intolerance, chronic pain, depression, anxiety, insomnia, PAF on Eliquis, history of DVT and PE, cirrhosis, and HFpEF now presenting to the emergency department with somnolence and confusion after his neighbor found him on the toilet too weak to get up.  Patient reported to EMS that he has been experiencing chest pain recently.  He reports intermittent chest pain for the past couple weeks, localized to the left chest, sometimes lasting seconds and sometimes lasting a couple days.  He is unable to identify alleviating or exacerbating factors for the chest pain, reports associated dyspnea, but denies nausea or diaphoresis.  Reports that his leg swelling has worsened in the past day.  Denies fevers, chills, or headache.  Denies taking unprescribed medications or extra doses of anything.  Denies alcohol use.  ED Course: Upon arrival to the ED, patient is found to be somnolent, wakes to painful stimuli and answers questions appropriately, but immediately falls asleep if not being stimulated.  Head CT is negative for acute intracranial abnormality.  Chemistry panel with hypokalemia and CBC with microcytic anemia.  Ammonia slightly elevated and ethanol undetectable.  Troponin and BNP are normal.  EKG with sinus arrhythmia, LAD, and incomplete LBBB.  Lactulose was ordered from the ED and hospitalists asked to admit.  Review of Systems:  All other systems reviewed and apart from HPI, are negative.  Past Medical History:  Diagnosis Date   Acute on chronic congestive heart failure (HCC)    Acute on chronic diastolic CHF (congestive heart failure) ([place]) 08/23/2019   Acute on chronic right-sided heart failure ([place]) 08/06/2019   Acute  respiratory failure with hypoxia (HCC)    Anemia of chronic disease    Atrial fibrillation ([place]) 12/23/2019   Atrial tachycardia ([place]) 08/12/2019   Benign essential HTN    Chest pain 08/23/2019   Chronic anticoagulation 12/26/2019   Chronic heart failure with preserved EF 05/30/2019   Echo 07/2019: EF 55-60, GR 1 DD   Chronic pain syndrome    Cirrhosis of liver without ascites ([place])    Class 2 severe obesity due to excess calories with serious comorbidity and body mass index (BMI) of 39.0 to 39.9 in adult ([place]) 05/30/2019   Debility 03/13/2019   Drug induced constipation    Dyspnea    Fatty liver disease, nonalcoholic    GIB (gastrointestinal bleeding) 03/04/2019   Heart failure, systolic, acute ([place]) 11/21/9145   History of pulmonary embolism 08/12/2019   Hyperlipidemia 01/24/2019   Hypertension 01/24/2019   Hypoalbuminemia due to protein-calorie malnutrition (HCC)    Hypokalemia    Hyponatremia    Hypotension due to drugs    Insomnia 01/24/2019   Lactic acidosis 08/23/2019   Lumbar disc herniation 01/24/2019   Mixed hyperlipidemia 01/24/2019   Morbid obesity with BMI of 40.0-44.9, adult ([place]) 05/30/2019   Normocytic anemia 06/08/2019   Occasional tremors 08/12/2019   OSA (obstructive sleep apnea) 08/12/2019   Palpitations 12/26/2019   Peripheral edema    Personal history of DVT (deep vein thrombosis) 12/26/2019   Portal hypertensive gastropathy ([place]) 03/06/2019   Seen on EGD 03/06/2019   Pulmonary emboli ([place]) 06/08/2019   Pulmonary embolism ([place]) 06/08/2019   Redness and swelling of lower leg 08/23/2019   Shock (  [place])    Thrombocytopenia ([place])     Past Surgical History:  Procedure Laterality Date   ESOPHAGOGASTRODUODENOSCOPY (EGD) WITH PROPOFOL N/A 03/05/2019   Procedure: ESOPHAGOGASTRODUODENOSCOPY (EGD) WITH PROPOFOL;  Surgeon: Doran Stabler, MD;  Location: [facility];  Service: Gastroenterology;  Laterality: N/A;  Large Blood Clot Removed   ESOPHAGOGASTRODUODENOSCOPY (EGD) WITH  PROPOFOL N/A 03/06/2019   Procedure: ESOPHAGOGASTRODUODENOSCOPY (EGD) WITH PROPOFOL;  Surgeon: Thornton Park, MD;  Location: [facility];  Service: Gastroenterology;  Laterality: N/A;   HEMORROIDECTOMY     PRESSURE SENSOR/CARDIOMEMS N/A 11/14/2020   Procedure: PRESSURE SENSOR/CARDIOMEMS;  Surgeon: Larey Dresser, MD;  Location: [facility];  Service: Cardiovascular;  Laterality: N/A;   RIGHT HEART CATH N/A 08/09/2019   Procedure: RIGHT HEART CATH;  Surgeon: Larey Dresser, MD;  Location: [facility];  Service: Cardiovascular;  Laterality: N/A;   RIGHT HEART CATH N/A 11/01/2020   Procedure: RIGHT HEART CATH;  Surgeon: Larey Dresser, MD;  Location: [facility];  Service: Cardiovascular;  Laterality: N/A;   RIGHT HEART CATH N/A 11/14/2020   Procedure: RIGHT HEART CATH;  Surgeon: Larey Dresser, MD;  Location: [facility];  Service: Cardiovascular;  Laterality: N/A;   RIGHT/LEFT HEART CATH AND CORONARY ANGIOGRAPHY N/A 05/10/2020   Procedure: RIGHT/LEFT HEART CATH AND CORONARY ANGIOGRAPHY;  Surgeon: Larey Dresser, MD;  Location: [facility];  Service: Cardiovascular;  Laterality: N/A;    Social History:   reports that he has never smoked. He has never used smokeless tobacco. He reports that he does not currently use alcohol. He reports that he does not currently use drugs after having used the following drugs: Marijuana.  Allergies  Allergen Reactions   Penicillins     Broke out in convulsions as a child  Did it involve swelling of the face/tongue/throat, SOB, or low BP? N/A Did it involve sudden or severe rash/hives, skin peeling, or any reaction on the inside of your mouth or nose? N/A Did you need to seek medical attention at a hospital or doctor's office?N/A When did it last happen? N/A If all above answers are "NO", may proceed with cephalosporin use.    Family History  Problem Relation Age of Onset   Hyperlipidemia Mother    Hypertension  Mother    Stroke Mother    CAD Maternal Grandmother    CAD Maternal Grandfather      Prior to Admission medications   Medication Sig Start Date End Date Taking? Authorizing Provider  acetaminophen (TYLENOL) 500 MG tablet Take 1,000 mg by mouth every 6 (six) hours as needed for moderate pain or headache.    [provider]  apixaban (ELIQUIS) 5 MG TABS tablet Take 1 tablet (5 mg total) by mouth 2 (two) times daily. 11/15/20   Joette Catching, PA-C  Buprenorphine HCl-Naloxone HCl 8-2 MG FILM Place 1 tablet under the tongue 2 (two) times daily.    [provider]  eplerenone (INSPRA) 50 MG tablet Take 2 tablets (100 mg total) by mouth daily. 11/12/20   Clegg, [name] D, NP  gabapentin (NEURONTIN) 800 MG tablet Take 1 tablet (800 mg total) by mouth 3 (three) times daily. 03/23/19   Angiulli, Lavon Paganini, PA-C  metolazone (ZAROXOLYN) 2.5 MG tablet Take 1 tablet (2.5 mg total) by mouth 3 (three) times a week. Monday, Wednesday and Fridays 11/15/20   Joette Catching, PA-C  metoprolol succinate (TOPROL XL) 25 MG 24 hr tablet Take 1  tablet (25 mg total) by mouth 2 (two) times daily. 11/02/20 01/31/21  Alma Friendly, MD  ondansetron (ZOFRAN-ODT) 4 MG disintegrating tablet TAKE 1 TABLET BY MOUTH EVERY 8 HOURS AS NEEDED FOR NAUSEA OR VOMITING 10/21/20   Larey Dresser, MD  pantoprazole (PROTONIX) 40 MG tablet Take 40 mg by mouth 2 (two) times daily.    [provider]  polyethylene glycol powder (GLYCOLAX/MIRALAX) 17 GM/SCOOP powder Take 17 g by mouth daily. Patient taking differently: Take 17 g by mouth daily as needed for mild constipation or moderate constipation. 11/02/20   Alma Friendly, MD  potassium chloride SA (KLOR-CON) 20 MEQ tablet Take 4 tablets (80 mEq total) by mouth 2 (two) times daily. Extra 40 mEq with metolazone days. 11/15/20   Joette Catching, PA-C  pravastatin (PRAVACHOL) 20 MG tablet Take 1 tablet (20 mg total) by mouth daily. 11/15/20   Joette Catching, PA-C  sertraline (ZOLOFT) 25 MG tablet TAKE 1 TABLET BY MOUTH DAILY 11/07/20   Larey Dresser, MD  sodium chloride (OCEAN) 0.65 % SOLN nasal spray Place 1 spray into both nostrils as needed for congestion.    [provider]  tamsulosin (FLOMAX) 0.4 MG CAPS capsule Take 1 capsule (0.4 mg total) by mouth daily. 11/15/20   Joette Catching, PA-C  torsemide (DEMADEX) 100 MG tablet Take 1 tablet (100 mg total) by mouth 2 (two) times daily. 09/26/20   Danford, Suann Larry, MD  VENTOLIN HFA 108 (90 Base) MCG/ACT inhaler Inhale 1 puff into the lungs every 4 (four) hours as needed for shortness of breath. 03/23/19   Angiulli, Lavon Paganini, PA-C  zolpidem (AMBIEN) 10 MG tablet Take 10 mg by mouth at bedtime. 05/19/19   [provider]  bumetanide (BUMEX) 1 MG tablet Take 4 tablets (4 mg total) by mouth daily with breakfast AND 3 tablets (3 mg total) every evening. 05/10/20 06/13/20  Larey Dresser, MD  metoprolol tartrate (LOPRESSOR) 50 MG tablet Take 1 tablet (50 mg total) by mouth 2 (two) times daily. 02/09/20 04/11/20  Mercy Riding, MD    Physical Exam: Vitals:   12/21/20 0245 12/21/20 0300 12/21/20 0400 12/21/20 0439  BP: 108/67 118/71 106/67 106/67  Pulse: 69 71 68 77  Resp: 15 14 10 15   Temp:    97.8 F (36.6 C)  TempSrc:      SpO2: 93% 94% 92% 98%    Constitutional: NAD, calm  Eyes: PERTLA, lids and conjunctivae normal ENMT: Mucous membranes are moist. Posterior pharynx clear of any exudate or lesions.   Neck: supple, no masses  Respiratory: no wheezing, no crackles. No accessory muscle use.  Cardiovascular: S1 & S2 heard, regular rate and rhythm. Pretibial pitting edema.   Abdomen: No distension, no tenderness, soft. Bowel sounds active.  Musculoskeletal: no clubbing / cyanosis. No joint deformity upper and lower extremities.   Skin: Hyperpigmentation in gaiter distribution bilaterally. Warm, dry, well-perfused. Neurologic: CN 2-12 grossly intact.  Sensation intact. Moving all extremities.  Psychiatric: Somnolent. Wakes shaken and answers questions appropriately but immediately falls asleep when not being actively stimulated.    Labs and Imaging on Admission: I have personally reviewed following labs and imaging studies  CBC: Recent Labs  Lab 12/21/20 0051 12/21/20 0356  WBC 8.4  --   NEUTROABS 7.0  --   HGB 10.8* 11.9*  HCT 36.1* 35.0*  MCV 78.8*  --   PLT 158  --    Basic Metabolic Panel: Recent Labs  Lab 12/20/20 1109 12/21/20 0051 12/21/20 0356  NA 136 135 138  K 3.1* 3.2* 3.8  CL 95* 95*  --   CO2 32 28  --   GLUCOSE 121* 132*  --   BUN 13 14  --   CREATININE 0.85 0.87  --   CALCIUM 9.0 8.9  --    GFR: Estimated Creatinine Clearance: 155.1 mL/min (by C-G formula based on SCr of 0.87 mg/dL). Liver Function Tests: Recent Labs  Lab 12/21/20 0051  AST 28  ALT 17  ALKPHOS 71  BILITOT 1.2  PROT 7.1  ALBUMIN 3.2*   No results for input(s): LIPASE, AMYLASE in the last 168 hours. Recent Labs  Lab 12/21/20 0053  AMMONIA 44*   Coagulation Profile: Recent Labs  Lab 12/21/20 0051  INR 1.3*   Cardiac Enzymes: No results for input(s): CKTOTAL, CKMB, CKMBINDEX, TROPONINI in the last 168 hours. BNP (last 3 results) No results for input(s): PROBNP in the last 8760 hours. HbA1C: No results for input(s): HGBA1C in the last 72 hours. CBG: Recent Labs  Lab 12/21/20 0241  GLUCAP 115*   Lipid Profile: No results for input(s): CHOL, HDL, LDLCALC, TRIG, CHOLHDL, LDLDIRECT in the last 72 hours. Thyroid Function Tests: No results for input(s): TSH, T4TOTAL, FREET4, T3FREE, THYROIDAB in the last 72 hours. Anemia Panel: No results for input(s): VITAMINB12, FOLATE, FERRITIN, TIBC, IRON, RETICCTPCT in the last 72 hours. Urine analysis:    Component Value Date/Time   COLORURINE YELLOW 12/21/2020 0104   APPEARANCEUR CLEAR 12/21/2020 0104   LABSPEC 1.010 12/21/2020 0104   PHURINE 6.5 12/21/2020 0104    GLUCOSEU NEGATIVE 12/21/2020 0104   HGBUR NEGATIVE 12/21/2020 0104   BILIRUBINUR NEGATIVE 12/21/2020 0104   KETONESUR NEGATIVE 12/21/2020 0104   PROTEINUR NEGATIVE 12/21/2020 0104   NITRITE NEGATIVE 12/21/2020 0104   LEUKOCYTESUR NEGATIVE 12/21/2020 0104   Sepsis Labs: @LABRCNTIP (procalcitonin:4,lacticidven:4) )No results found for this or any previous visit (from the past 240 hour(s)).   Radiological Exams on Admission: CT HEAD WO CONTRAST (5MM)  Result Date: 12/21/2020 CLINICAL DATA:  Altered mental status EXAM: CT HEAD WITHOUT CONTRAST TECHNIQUE: Contiguous axial images were obtained from the base of the skull through the vertex without intravenous contrast. COMPARISON:  None. FINDINGS: Brain: Normal anatomic configuration. Parenchymal volume loss is commensurate with the patient's age. Mild periventricular white matter changes are present likely reflecting the sequela of small vessel ischemia. No abnormal intra or extra-axial mass lesion or fluid collection. No abnormal mass effect or midline shift. No evidence of acute intracranial hemorrhage or infarct. Ventricular size is normal. Cerebellum unremarkable. Vascular: No asymmetric hyperdense vasculature at the skull base. Skull: Intact Sinuses/Orbits: Paranasal sinuses are clear. Orbits are unremarkable. Other: Mastoid air cells and middle ear cavities are clear. IMPRESSION: No acute intracranial abnormality.  Mild senescent change. Electronically Signed   By: Fidela Salisbury M.D.   On: 12/21/2020 03:05   DG Chest Port 1 View  Result Date: 12/21/2020 CLINICAL DATA:  Chest pain, generalized weakness EXAM: PORTABLE CHEST 1 VIEW COMPARISON:  10/29/2020 FINDINGS: Lungs are clear. No pneumothorax or pleural effusion. Cardiac size within normal limits. Widening of the right paratracheal stripe relates to mediastinal lipomatosis better seen on CT examination of 06/08/2019. Pulmonary vascularity is normal. No acute bone abnormality. IMPRESSION: No  active disease. Electronically Signed   By: Fidela Salisbury M.D.   On: 12/21/2020 02:41    EKG: Independently reviewed. Sinus rhythm with sinus arrhythmia, LAD, incomplete LBBB.   Assessment/Plan   1.  Acute encephalopathy  - Presents with somnolence and confusion after neighbor found him on toilet too weak to get up  - He answers questions appropriately in ED but falls asleep immediately unless actively being shaken  - No acute findings on head CT, no hypercarbia, ammonia only slightly elevated  - Possibly polypharmacy  - Hold sedating medications, check TSH and random cortisol, give lactulose    2. Chest pain  - Patient reports intermittent chest pain for a couple weeks  - HS troponin normal x2, no acute CXR findings, and no acute ischemic changes on EKG in ED  - No obstructive CAD on LHC from January 2022  - Continue statin   3. HFpEF  - Wt is up 3 lbs from office vision 3 wks ago, BNP 27 in ED, leg swelling noted in ED, no respiratory s/s  - TTE from July 2022 with EF 60-65%%, mild LVH, normal diastolic parameters, normal RV function, mild left atrial enlargement, no significant valve disease  - He had CardioMEMS placed last month  - Continue home regimen, monitor weight    4. Paroxysmal atrial fibrillation  - Continue Eliquis and Toprol    5. Cirrhosis  - Ammonia slightly elevated and lactulose ordered    6. Hypokalemia  - Replace, check magnesium   7. Chronic pain  - Hold sedating medications for now    8. Depression, anxiety, insomnia  - Hold sedating medications for now    9. Hx of DVT and PE  - Continue Eliquis    DVT prophylaxis: Eliquis  Code Status: Full  Level of Care: Level of care: Telemetry Cardiac Family Communication: none present  Disposition Plan:  Patient is from: Home  Anticipated d/c is to: TBD Anticipated d/c date is: 12/22/20 Patient currently: Pending improvement in mental status  Consults called: none  Admission status: Observation      Vianne Bulls, MD [facility]  12/21/2020, 6:34 AM

## 2020-12-21 NOTE — ED Provider Notes (Signed)
[facility] EMERGENCY DEPARTMENT Provider Note   CSN: [id number] Arrival date & time: 12/21/20  0032     History Chief Complaint  Patient presents with   Chest Pain    Assad Youngblood is a 65 y.o. male.  Patient presents to the emergency department for evaluation of multiple problems.  Patient has been experiencing intermittent episodes of chest pain for several days.  He has been experiencing diffuse generalized weakness as well.  A neighbor apparently checked on him and found him altered and so weak that he could not get off the commode.  Patient somnolent and altered at arrival, level 5 caveat.      Past Medical History:  Diagnosis Date   Acute on chronic congestive heart failure (HCC)    Acute on chronic diastolic CHF (congestive heart failure) ([place]) 08/23/2019   Acute on chronic right-sided heart failure ([place]) 08/06/2019   Acute respiratory failure with hypoxia (HCC)    Anemia of chronic disease    Atrial fibrillation ([place]) 12/23/2019   Atrial tachycardia ([place]) 08/12/2019   Benign essential HTN    Chest pain 08/23/2019   Chronic anticoagulation 12/26/2019   Chronic heart failure with preserved EF 05/30/2019   Echo 07/2019: EF 55-60, GR 1 DD   Chronic pain syndrome    Cirrhosis of liver without ascites ([place])    Class 2 severe obesity due to excess calories with serious comorbidity and body mass index (BMI) of 39.0 to 39.9 in adult ([place]) 05/30/2019   Debility 03/13/2019   Drug induced constipation    Dyspnea    Fatty liver disease, nonalcoholic    GIB (gastrointestinal bleeding) 03/04/2019   Heart failure, systolic, acute ([place]) 11/24/4816   History of pulmonary embolism 08/12/2019   Hyperlipidemia 01/24/2019   Hypertension 01/24/2019   Hypoalbuminemia due to protein-calorie malnutrition (HCC)    Hypokalemia    Hyponatremia    Hypotension due to drugs    Insomnia 01/24/2019   Lactic acidosis 08/23/2019   Lumbar disc herniation 01/24/2019   Mixed  hyperlipidemia 01/24/2019   Morbid obesity with BMI of 40.0-44.9, adult ([place]) 05/30/2019   Normocytic anemia 06/08/2019   Occasional tremors 08/12/2019   OSA (obstructive sleep apnea) 08/12/2019   Palpitations 12/26/2019   Peripheral edema    Personal history of DVT (deep vein thrombosis) 12/26/2019   Portal hypertensive gastropathy ([place]) 03/06/2019   Seen on EGD 03/06/2019   Pulmonary emboli ([place]) 06/08/2019   Pulmonary embolism ([place]) 06/08/2019   Redness and swelling of lower leg 08/23/2019   Shock ([place])    Thrombocytopenia ([place])     Patient Active Problem List   Diagnosis Date Noted   Chronic diastolic (congestive) heart failure ([place]) 11/14/2020   Syncope 10/28/2020   CHF (congestive heart failure) ([place]) 10/28/2020   Chronic venous stasis dermatitis of both lower extremities 08/27/2020   Cellulitis of both lower extremities 08/14/2020   Microcytic anemia 08/14/2020   GERD without esophagitis 06/10/2020   Atrial fibrillation with rapid ventricular response ([place]) 03/24/2020   Acute on chronic right heart failure ([place]) 01/27/2020   Acute respiratory failure with hypoxia (HCC)    Fatty liver disease, nonalcoholic    Shock ([place])    Personal history of DVT (deep vein thrombosis) 12/26/2019   Chronic anticoagulation 12/26/2019   Palpitations 12/26/2019   AF (paroxysmal atrial fibrillation) ([place]) 12/23/2019   Heart failure, systolic, acute ([place]) 56/31/4970   Acute on chronic congestive heart failure (HCC)    Chronic diastolic HF (heart failure) ([place])  08/23/2019   Chest pain 08/23/2019   Lactic acidosis 08/23/2019   History of pulmonary embolism 08/12/2019   Atrial tachycardia ([place]) 08/12/2019   Occasional tremors 08/12/2019   OSA (obstructive sleep apnea) 08/12/2019   Acute on chronic right-sided heart failure ([place]) 08/06/2019   Pulmonary embolism ([place]) 06/08/2019   Normocytic anemia 06/08/2019   Pulmonary emboli ([place]) 06/08/2019   Chronic heart failure with preserved ejection  fraction ([place]) 05/30/2019   Class 2 severe obesity due to excess calories with serious comorbidity and body mass index (BMI) of 39.0 to 39.9 in adult ([place]) 05/30/2019   Morbid obesity with BMI of 40.0-44.9, adult ([place]) 05/30/2019   Hyponatremia    Drug induced constipation    Peripheral edema    Hypotension due to drugs    Hypoalbuminemia due to protein-calorie malnutrition (HCC)    Hypokalemia    Thrombocytopenia (HCC)    Anemia of chronic disease    Cirrhosis of liver without ascites ([place])    Chronic pain syndrome    Debility 03/13/2019   Portal hypertensive gastropathy ([place]) 03/06/2019   Dyspnea    GIB (gastrointestinal bleeding) 03/04/2019   Mixed hyperlipidemia 01/24/2019   Insomnia 01/24/2019   Hyperlipidemia 01/24/2019   Essential hypertension 01/24/2019   Lumbar disc herniation 01/24/2019    Past Surgical History:  Procedure Laterality Date   ESOPHAGOGASTRODUODENOSCOPY (EGD) WITH PROPOFOL N/A 03/05/2019   Procedure: ESOPHAGOGASTRODUODENOSCOPY (EGD) WITH PROPOFOL;  Surgeon: Doran Stabler, MD;  Location: [facility];  Service: Gastroenterology;  Laterality: N/A;  Large Blood Clot Removed   ESOPHAGOGASTRODUODENOSCOPY (EGD) WITH PROPOFOL N/A 03/06/2019   Procedure: ESOPHAGOGASTRODUODENOSCOPY (EGD) WITH PROPOFOL;  Surgeon: Thornton Park, MD;  Location: [facility];  Service: Gastroenterology;  Laterality: N/A;   HEMORROIDECTOMY     PRESSURE SENSOR/CARDIOMEMS N/A 11/14/2020   Procedure: PRESSURE SENSOR/CARDIOMEMS;  Surgeon: Larey Dresser, MD;  Location: [facility];  Service: Cardiovascular;  Laterality: N/A;   RIGHT HEART CATH N/A 08/09/2019   Procedure: RIGHT HEART CATH;  Surgeon: Larey Dresser, MD;  Location: [facility];  Service: Cardiovascular;  Laterality: N/A;   RIGHT HEART CATH N/A 11/01/2020   Procedure: RIGHT HEART CATH;  Surgeon: Larey Dresser, MD;  Location: [facility];  Service: Cardiovascular;  Laterality: N/A;   RIGHT HEART  CATH N/A 11/14/2020   Procedure: RIGHT HEART CATH;  Surgeon: Larey Dresser, MD;  Location: [facility];  Service: Cardiovascular;  Laterality: N/A;   RIGHT/LEFT HEART CATH AND CORONARY ANGIOGRAPHY N/A 05/10/2020   Procedure: RIGHT/LEFT HEART CATH AND CORONARY ANGIOGRAPHY;  Surgeon: Larey Dresser, MD;  Location: [facility];  Service: Cardiovascular;  Laterality: N/A;       Family History  Problem Relation Age of Onset   Hyperlipidemia Mother    Hypertension Mother    Stroke Mother    CAD Maternal Grandmother    CAD Maternal Grandfather     Social History   Tobacco Use   Smoking status: Never   Smokeless tobacco: Never  Vaping Use   Vaping Use: Never used  Substance Use Topics   Alcohol use: Not Currently   Drug use: Not Currently    Types: Marijuana    Comment: Smoked for 30 years    Home Medications Prior to Admission medications   Medication Sig Start Date End Date Taking? Authorizing Provider  acetaminophen (TYLENOL) 500 MG tablet Take 1,000 mg by mouth every 6 (six) hours as needed for moderate pain or  headache.    [provider]  apixaban (ELIQUIS) 5 MG TABS tablet Take 1 tablet (5 mg total) by mouth 2 (two) times daily. 11/15/20   Joette Catching, PA-C  Buprenorphine HCl-Naloxone HCl 8-2 MG FILM Place 1 tablet under the tongue 2 (two) times daily.    [provider]  eplerenone (INSPRA) 50 MG tablet Take 2 tablets (100 mg total) by mouth daily. 11/12/20   Clegg, [name] D, NP  gabapentin (NEURONTIN) 800 MG tablet Take 1 tablet (800 mg total) by mouth 3 (three) times daily. 03/23/19   Angiulli, Lavon Paganini, PA-C  metolazone (ZAROXOLYN) 2.5 MG tablet Take 1 tablet (2.5 mg total) by mouth 3 (three) times a week. Monday, Wednesday and Fridays 11/15/20   Joette Catching, PA-C  metoprolol succinate (TOPROL XL) 25 MG 24 hr tablet Take 1 tablet (25 mg total) by mouth 2 (two) times daily. 11/02/20 01/31/21  Alma Friendly, MD  ondansetron  (ZOFRAN-ODT) 4 MG disintegrating tablet TAKE 1 TABLET BY MOUTH EVERY 8 HOURS AS NEEDED FOR NAUSEA OR VOMITING 10/21/20   Larey Dresser, MD  pantoprazole (PROTONIX) 40 MG tablet Take 40 mg by mouth 2 (two) times daily.    [provider]  polyethylene glycol powder (GLYCOLAX/MIRALAX) 17 GM/SCOOP powder Take 17 g by mouth daily. Patient taking differently: Take 17 g by mouth daily as needed for mild constipation or moderate constipation. 11/02/20   Alma Friendly, MD  potassium chloride SA (KLOR-CON) 20 MEQ tablet Take 4 tablets (80 mEq total) by mouth 2 (two) times daily. Extra 40 mEq with metolazone days. 11/15/20   Joette Catching, PA-C  pravastatin (PRAVACHOL) 20 MG tablet Take 1 tablet (20 mg total) by mouth daily. 11/15/20   Joette Catching, PA-C  sertraline (ZOLOFT) 25 MG tablet TAKE 1 TABLET BY MOUTH DAILY 11/07/20   Larey Dresser, MD  sodium chloride (OCEAN) 0.65 % SOLN nasal spray Place 1 spray into both nostrils as needed for congestion.    [provider]  tamsulosin (FLOMAX) 0.4 MG CAPS capsule Take 1 capsule (0.4 mg total) by mouth daily. 11/15/20   Joette Catching, PA-C  torsemide (DEMADEX) 100 MG tablet Take 1 tablet (100 mg total) by mouth 2 (two) times daily. 09/26/20   Danford, Suann Larry, MD  VENTOLIN HFA 108 (90 Base) MCG/ACT inhaler Inhale 1 puff into the lungs every 4 (four) hours as needed for shortness of breath. 03/23/19   Angiulli, Lavon Paganini, PA-C  zolpidem (AMBIEN) 10 MG tablet Take 10 mg by mouth at bedtime. 05/19/19   [provider]  bumetanide (BUMEX) 1 MG tablet Take 4 tablets (4 mg total) by mouth daily with breakfast AND 3 tablets (3 mg total) every evening. 05/10/20 06/13/20  Larey Dresser, MD  metoprolol tartrate (LOPRESSOR) 50 MG tablet Take 1 tablet (50 mg total) by mouth 2 (two) times daily. 02/09/20 04/11/20  Mercy Riding, MD    Allergies    Penicillins  Review of Systems   Review of Systems  Unable to perform  ROS: Mental status change   Physical Exam Updated Vital Signs BP 106/67   Pulse 77   Temp 97.8 F (36.6 C)   Resp 15   SpO2 98%   Physical Exam Vitals and nursing note reviewed.  Constitutional:      Appearance: Normal appearance. He is obese. He is ill-appearing.  HENT:     Head: Normocephalic and atraumatic.     Right Ear:  Hearing normal.     Left Ear: Hearing normal.     Nose: Nose normal.  Eyes:     Conjunctiva/sclera: Conjunctivae normal.     Pupils: Pupils are equal, round, and reactive to light.  Cardiovascular:     Rate and Rhythm: Regular rhythm.     Heart sounds: S1 normal and S2 normal. No murmur heard.   No friction rub. No gallop.  Pulmonary:     Effort: Pulmonary effort is normal. No respiratory distress.     Breath sounds: Normal breath sounds.  Chest:     Chest wall: No tenderness.  Abdominal:     General: Bowel sounds are normal.     Palpations: Abdomen is soft.     Tenderness: There is no abdominal tenderness. There is no guarding or rebound. Negative signs include Murphy's sign and McBurney's sign.     Hernia: No hernia is present.  Musculoskeletal:        General: Normal range of motion.     Cervical back: Normal range of motion and neck supple.     Right lower leg: Edema present.     Left lower leg: Edema present.  Skin:    General: Skin is warm and dry.     Findings: No rash.  Neurological:     Mental Status: He is oriented to person, place, and time. He is lethargic.     GCS: GCS eye subscore is 4. GCS verbal subscore is 5. GCS motor subscore is 6.     Cranial Nerves: No cranial nerve deficit.     Sensory: No sensory deficit.     Coordination: Coordination normal.     Comments: Patient somnolent and lethargic, does awaken to voice and answer questions appropriately, but immediately falls back asleep.  Psychiatric:        Speech: Speech normal.        Behavior: Behavior normal.        Thought Content: Thought content normal.    ED  Results / Procedures / Treatments   Labs (all labs ordered are listed, but only abnormal results are displayed) Labs Reviewed  CBC WITH DIFFERENTIAL/PLATELET - Abnormal; Notable for the following components:      Result Value   Hemoglobin 10.8 (*)    HCT 36.1 (*)    MCV 78.8 (*)    MCH 23.6 (*)    MCHC 29.9 (*)    RDW 16.9 (*)    All other components within normal limits  COMPREHENSIVE METABOLIC PANEL - Abnormal; Notable for the following components:   Potassium 3.2 (*)    Chloride 95 (*)    Glucose, Bld 132 (*)    Albumin 3.2 (*)    All other components within normal limits  PROTIME-INR - Abnormal; Notable for the following components:   Prothrombin Time 16.1 (*)    INR 1.3 (*)    All other components within normal limits  AMMONIA - Abnormal; Notable for the following components:   Ammonia 44 (*)    All other components within normal limits  CBG MONITORING, ED - Abnormal; Notable for the following components:   Glucose-Capillary 115 (*)    All other components within normal limits  I-STAT VENOUS BLOOD GAS, ED - Abnormal; Notable for the following components:   pH, Ven 7.431 (*)    pO2, Ven 20.0 (*)    Bicarbonate 37.3 (*)    TCO2 39 (*)    Acid-Base Excess 11.0 (*)    HCT 35.0 (*)  Hemoglobin 11.9 (*)    All other components within normal limits  RESP PANEL BY RT-PCR (FLU A&B, COVID) ARPGX2  BRAIN NATRIURETIC PEPTIDE  URINALYSIS, ROUTINE W REFLEX MICROSCOPIC  RAPID URINE DRUG SCREEN, HOSP PERFORMED  ETHANOL  BLOOD GAS, ARTERIAL  TROPONIN I (HIGH SENSITIVITY)  TROPONIN I (HIGH SENSITIVITY)    EKG EKG Interpretation  Date/Time:  Saturday December 21 2020 01:13:01 EDT Ventricular Rate:  71 PR Interval:  162 QRS Duration: 106 QT Interval:  406 QTC Calculation: 441 R Axis:   -30 Text Interpretation: Normal sinus rhythm with sinus arrhythmia Left axis deviation Low voltage QRS Incomplete left bundle branch block Nonspecific T wave abnormality Abnormal ECG No  significant change since last tracing Confirmed by Orpah Greek [phone]) on 12/21/2020 2:50:14 AM  Radiology CT HEAD WO CONTRAST (5MM)  Result Date: 12/21/2020 CLINICAL DATA:  Altered mental status EXAM: CT HEAD WITHOUT CONTRAST TECHNIQUE: Contiguous axial images were obtained from the base of the skull through the vertex without intravenous contrast. COMPARISON:  None. FINDINGS: Brain: Normal anatomic configuration. Parenchymal volume loss is commensurate with the patient's age. Mild periventricular white matter changes are present likely reflecting the sequela of small vessel ischemia. No abnormal intra or extra-axial mass lesion or fluid collection. No abnormal mass effect or midline shift. No evidence of acute intracranial hemorrhage or infarct. Ventricular size is normal. Cerebellum unremarkable. Vascular: No asymmetric hyperdense vasculature at the skull base. Skull: Intact Sinuses/Orbits: Paranasal sinuses are clear. Orbits are unremarkable. Other: Mastoid air cells and middle ear cavities are clear. IMPRESSION: No acute intracranial abnormality.  Mild senescent change. Electronically Signed   By: Fidela Salisbury M.D.   On: 12/21/2020 03:05   DG Chest Port 1 View  Result Date: 12/21/2020 CLINICAL DATA:  Chest pain, generalized weakness EXAM: PORTABLE CHEST 1 VIEW COMPARISON:  10/29/2020 FINDINGS: Lungs are clear. No pneumothorax or pleural effusion. Cardiac size within normal limits. Widening of the right paratracheal stripe relates to mediastinal lipomatosis better seen on CT examination of 06/08/2019. Pulmonary vascularity is normal. No acute bone abnormality. IMPRESSION: No active disease. Electronically Signed   By: Fidela Salisbury M.D.   On: 12/21/2020 02:41    Procedures Procedures   Medications Ordered in ED Medications  lactulose (CHRONULAC) 10 GM/15ML solution 20 g (has no administration in time range)    ED Course  I have reviewed the triage vital signs and the nursing  notes.  Pertinent labs & imaging results that were available during my care of the patient were reviewed by me and considered in my medical decision making (see chart for details).    MDM Rules/Calculators/A&P                           Patient with history of congestive heart failure and cirrhosis presents to the emergency department with complaints of generalized weakness, chest pain, altered mental status.  Patient somnolent at arrival, requires continuous stimulation to stay awake.  Etiology is unclear.  Head CT is negative.  No focal neurologic findings.  Drug screen is negative.  Blood gas does not show any significant CO2 retention.  Patient does have lower extremity swelling but does not appear to have any significant pulmonary edema.  Patient does have mildly elevated ammonia 44, not high enough to explain the patient's altered status.  Will give lactulose.  Patient will require hospitalization for further management of acute encephalopathy of unclear etiology as he cannot stay awake to care  for himself, is too weak to ambulate.  Final Clinical Impression(s) / ED Diagnoses Final diagnoses:  Encephalopathy acute    Rx / DC Orders ED Discharge Orders     None        Suhana Wilner, Gwenyth Allegra, MD 12/21/20 [phone]

## 2020-12-21 NOTE — ED Provider Notes (Signed)
Emergency Medicine Provider Triage Evaluation Note  Charles Gray , a 65 y.o. male  was evaluated in triage.  Pt presents to the emergency department via [facility] with complaints of chest pain and generalized weakness.  He is acutely altered on exam and requires continuous stimulation to remain awake.  For 5 caveat for altered mental status.  Per paramedic: Patient takes torsemide and metolazone for his CHF.  Reports he has had history of hypokalemia in the past.  Reports they think patient may have gained some weight in the last few days.  Records reviewed.  History of DVT, A. fib, chronic pain, hypokalemia, cirrhosis of the liver, CHF, GI bleed.  He is anticoagulated.  Unknown with recent falls.  No obvious trauma to the head.  Review of Systems  Positive: Altered mental status, chest pain Negative: Fever  Physical Exam  BP 114/80 (BP Location: Left Arm)   Pulse 73   Temp 98.8 F (37.1 C) (Oral)   Resp 19   SpO2 94%  Gen:   Acutely altered.  Very sleepy and requires constant arousal.  Slow to answer questions. Resp:  Normal effort, breath sounds difficult to auscultate due to body habitus MSK:   Moves extremities without difficulty, venous stasis changes to the bilateral lower legs Other:    Medical Decision Making  Medically screening exam initiated at 12:59 AM.  Appropriate orders placed.  Charles Gray was informed that the remainder of the evaluation will be completed by another provider, this initial triage assessment does not replace that evaluation, and the importance of remaining in the ED until their evaluation is complete.  Acutely altered mental status.  Work-up pending.  Patient will need the next room.  Charge nurse notified.    Charles Gray, Gwenlyn Perking 12/21/20 0103    Mesner, [name], MD 12/21/20 [phone]

## 2020-12-21 NOTE — Progress Notes (Signed)
Patient was seen and examined at the bedside.  He complains of pain in bilateral legs.  It appears he has chronic pain from chronic venous insufficiency.  Confusion is a little better.  He said this unna boot has helped in the past.  Continue current management.  Order PT and OT because of debility.  Unna boot has been ordered as well.

## 2020-12-22 DIAGNOSIS — I5032 Chronic diastolic (congestive) heart failure: Secondary | ICD-10-CM | POA: Diagnosis not present

## 2020-12-22 DIAGNOSIS — I480 Paroxysmal atrial fibrillation: Secondary | ICD-10-CM | POA: Diagnosis not present

## 2020-12-22 DIAGNOSIS — G9340 Encephalopathy, unspecified: Secondary | ICD-10-CM | POA: Diagnosis not present

## 2020-12-22 LAB — CBC
HCT: 35.8 % — ABNORMAL LOW (ref 39.0–52.0)
Hemoglobin: 10.9 g/dL — ABNORMAL LOW (ref 13.0–17.0)
MCH: 23.9 pg — ABNORMAL LOW (ref 26.0–34.0)
MCHC: 30.4 g/dL (ref 30.0–36.0)
MCV: 78.5 fL — ABNORMAL LOW (ref 80.0–100.0)
Platelets: 155 10*3/uL (ref 150–400)
RBC: 4.56 MIL/uL (ref 4.22–5.81)
RDW: 17 % — ABNORMAL HIGH (ref 11.5–15.5)
WBC: 8 10*3/uL (ref 4.0–10.5)
nRBC: 0 % (ref 0.0–0.2)

## 2020-12-22 LAB — BASIC METABOLIC PANEL
Anion gap: 10 (ref 5–15)
BUN: 11 mg/dL (ref 8–23)
CO2: 31 mmol/L (ref 22–32)
Calcium: 8.7 mg/dL — ABNORMAL LOW (ref 8.9–10.3)
Chloride: 95 mmol/L — ABNORMAL LOW (ref 98–111)
Creatinine, Ser: 0.97 mg/dL (ref 0.61–1.24)
GFR, Estimated: >60 mL/min (ref >60)
Glucose, Bld: 122 mg/dL — ABNORMAL HIGH (ref 70–99)
Potassium: 3.3 mmol/L — ABNORMAL LOW (ref 3.5–5.1)
Sodium: 136 mmol/L (ref 135–145)

## 2020-12-22 LAB — AMMONIA: Ammonia: 37 umol/L — ABNORMAL HIGH (ref 9–35)

## 2020-12-22 LAB — MAGNESIUM: Magnesium: 2 mg/dL (ref 1.7–2.4)

## 2020-12-22 MED ORDER — POTASSIUM CHLORIDE CRYS ER 20 MEQ PO TBCR
40.0000 meq | EXTENDED_RELEASE_TABLET | Freq: Once | ORAL | Status: AC
  Administered 2020-12-22: 40 meq via ORAL
  Filled 2020-12-22: qty 2

## 2020-12-22 MED ORDER — GABAPENTIN 400 MG PO CAPS
800.0000 mg | ORAL_CAPSULE | Freq: Three times a day (TID) | ORAL | Status: DC
  Administered 2020-12-22 – 2020-12-23 (×4): 800 mg via ORAL
  Filled 2020-12-22 (×4): qty 2

## 2020-12-22 MED ORDER — POTASSIUM CHLORIDE 10 MEQ/100ML IV SOLN
10.0000 meq | INTRAVENOUS | Status: DC
  Filled 2020-12-22: qty 100

## 2020-12-22 MED ORDER — ZOLPIDEM TARTRATE 5 MG PO TABS
10.0000 mg | ORAL_TABLET | Freq: Every day | ORAL | Status: DC
  Administered 2020-12-22: 10 mg via ORAL
  Filled 2020-12-22: qty 2

## 2020-12-22 NOTE — Evaluation (Signed)
Physical Therapy Evaluation Patient Details Name: Charles Gray MRN: [id number] DOB: July 03, 1955 Today's Date: 12/22/2020   History of Present Illness  Pt is a 65 y.o. male who presented to the ED 9/10 with somnolence and confusion after his neighbor found him on the toilet too weak to get up. PMH significant for OSA with CPAP intolerance, chronic pain, depression, anxiety, insomnia, PAF on Eliquis, history of DVT and PE, cirrhosis, and HFpEF.   Clinical Impression  Pt admitted with above diagnosis. PTA pt lived alone in apartment, mod I mobility with RW. Pt has PCA 1.5 hrs/day 5-6 days/week. He was also active with HHPT at time of admission. On eval, he required min guard assist transfers and ambulation 25' with RW. Gait distance limited by pain and fatigue. Pt reports he is hurting all over and did not sleep well last night. Pt currently with functional limitations due to the deficits listed below (see PT Problem List). Pt will benefit from skilled PT to increase their independence and safety with mobility to allow discharge to the venue listed below.       Follow Up Recommendations Home health PT    Equipment Recommendations  None recommended by PT    Recommendations for Other Services       Precautions / Restrictions Precautions Precautions: Fall      Mobility  Bed Mobility Overal bed mobility: Modified Independent             General bed mobility comments: HOB elevated, +rail    Transfers Overall transfer level: Needs assistance Equipment used: Rolling walker (2 wheeled) Transfers: Sit to/from Stand Sit to Stand: Min guard         General transfer comment: min guard for safety, increased time to power up  Ambulation/Gait Ambulation/Gait assistance: Min guard Gait Distance (Feet): 25 Feet Assistive device: Rolling walker (2 wheeled) Gait Pattern/deviations: Step-through pattern;Decreased stride length Gait velocity: decreased   General Gait Details: slow,  steady gait with RW. Distance limited by pain and fatigue.  Stairs            Wheelchair Mobility    Modified Rankin (Stroke Patients Only)       Balance Overall balance assessment: Needs assistance Sitting-balance support: No upper extremity supported;Feet supported Sitting balance-Leahy Scale: Good     Standing balance support: Bilateral upper extremity supported;During functional activity;No upper extremity supported Standing balance-Leahy Scale: Fair Standing balance comment: static stand without UE support, RW for amb                             Pertinent Vitals/Pain Pain Assessment: Faces Faces Pain Scale: Hurts even more Pain Location: generalized + headache Pain Descriptors / Indicators: Discomfort;Headache;Grimacing Pain Intervention(s): Limited activity within patient's tolerance;Monitored during session    [place] expects to be discharged to:: Private residence Living Arrangements: Alone Available Help at Discharge: Personal care attendant Type of Home: Apartment Home Access: Stairs to enter Entrance Stairs-Rails: Right;Left;Can reach both Technical brewer of Steps: 20+ Home Layout: One level Home Equipment: Tub bench;Walker - 2 wheels;Hand held shower head;Hospital bed      Prior Function Level of Independence: Needs assistance   Gait / Transfers Assistance Needed: using walker for gait, sleeps in recliner  ADL's / Homemaking Assistance Needed: aide assists with getting in/out of tub/bathing and dressing assist 5-6x/wk, assist for homemaking, groceries delivered  Comments: active with HHPT at time of admission     Hand Dominance  Dominant Hand: Right    Extremity/Trunk Assessment   Upper Extremity Assessment Upper Extremity Assessment: Defer to OT evaluation    Lower Extremity Assessment Lower Extremity Assessment: Generalized weakness (BLE edema distally)    Cervical / Trunk Assessment Cervical /  Trunk Assessment: Kyphotic  Communication   Communication: No difficulties  Cognition Arousal/Alertness: Awake/alert Behavior During Therapy: Flat affect;WFL for tasks assessed/performed Overall Cognitive Status: Within Functional Limits for tasks assessed                                        General Comments      Exercises     Assessment/Plan    PT Assessment Patient needs continued PT services  PT Problem List Decreased strength;Decreased mobility;Decreased activity tolerance;Pain;Decreased balance       PT Treatment Interventions Therapeutic activities;Gait training;Therapeutic exercise;Patient/family education;Balance training;Stair training;Functional mobility training    PT Goals (Current goals can be found in the Care Plan section)  Acute Rehab PT Goals Patient Stated Goal: feel better PT Goal Formulation: With patient Time For Goal Achievement: 01/05/21 Potential to Achieve Goals: Good    Frequency Min 3X/week   Barriers to discharge        Co-evaluation               AM-PAC PT "6 Clicks" Mobility  Outcome Measure Help needed turning from your back to your side while in a flat bed without using bedrails?: None Help needed moving from lying on your back to sitting on the side of a flat bed without using bedrails?: None Help needed moving to and from a bed to a chair (including a wheelchair)?: A Little Help needed standing up from a chair using your arms (e.g., wheelchair or bedside chair)?: A Little Help needed to walk in hospital room?: A Little Help needed climbing 3-5 steps with a railing? : A Lot 6 Click Score: 19    End of Session   Activity Tolerance: Patient limited by pain;Patient limited by fatigue Patient left: in bed;with call bell/phone within reach Nurse Communication: Mobility status;Other (comment) (needs bariatric recliner in room) PT Visit Diagnosis: Muscle weakness (generalized) (M62.81);Difficulty in walking, not  elsewhere classified (R26.2);Pain    Time: 3382-5053 PT Time Calculation (min) (ACUTE ONLY): 18 min   Charges:   PT Evaluation $PT Eval Moderate Complexity: 1 Mod          Lorrin Goodell, PT  Office # [phone] Pager [phone]   Lorriane Shire 12/22/2020, 9:09 AM

## 2020-12-22 NOTE — Progress Notes (Addendum)
Progress Note    Charles Gray  GOT:[id number] DOB: [birth year]  DOA: 12/21/2020 PCP: Enid Skeens., MD      Brief Narrative:    Medical records reviewed and are as summarized below:  Charles Gray is a 65 y.o. male with medical history significant for OSA with CPAP intolerance, chronic pain, depression, anxiety, insomnia, PAF on Eliquis, history of DVT and PE, cirrhosis, morbid obesity, chronic diastolic CHF, COPD.  He presented to the hospital because of somnolence, confusion, generalized weakness and chest pain.  Troponins were negative.  He was admitted to the hospital for acute toxic encephalopathy that was attributed to probable polypharmacy.  Ambien and gabapentin were held briefly.  However, patient insisted on resuming these medications to help with sleep and pain respectively.   Assessment/Plan:   Principal Problem:   Acute encephalopathy Active Problems:   Hypokalemia   Anemia of chronic disease   Cirrhosis of liver without ascites (HCC)   Chronic pain syndrome   Chronic heart failure with preserved ejection fraction (HCC)   History of pulmonary embolism   Chronic diastolic HF (heart failure) (HCC)   Chest pain   AF (paroxysmal atrial fibrillation) (HCC)   Personal history of DVT (deep vein thrombosis)   COPD (chronic obstructive pulmonary disease) (HCC)   Body mass index is 38.4 kg/m.  (Morbid obesity)   Acute toxic encephalopathy: Suspected to be due to polypharmacy.  Mental status has improved and appears back to baseline.  Resume Ambien and gabapentin for insomnia and peripheral neuropathy respectively.  Chest pain: Improved.  Troponins were negative.  Generalized weakness: PT and OT recommend home health therapy.  Nausea, bilateral leg pain, chronic pain syndrome: Treat with antiemetics and analgesics including buprenorphine.  Hypokalemia: Replete potassium  Chronic diastolic CHF: Continue diuretics  Paroxysmal atrial fibrillation:  Continue with Toprol-XL and Eliquis  Liver cirrhosis: Compensated.  Other comorbidities include history of DVT and PE, depression, anxiety, insomnia, chronic pain, peripheral neuropathy  He said he does not feel ready to go home today.  He lives alone and he wants to stay in the hospital until 12/24/2020.  He was informed that he will likely be discharged home tomorrow if he is feeling better by tomorrow.  Diet Order             Diet Heart Room service appropriate? Yes; Fluid consistency: Thin  Diet effective now                      Consultants: None  Procedures: None    Medications:    apixaban  5 mg Oral BID   buprenorphine  8 mg Sublingual Daily   gabapentin  800 mg Oral TID   melatonin  5 mg Oral QHS   [START ON 12/23/2020] metolazone  2.5 mg Oral Once per day on Mon Wed Fri   metoprolol succinate  25 mg Oral BID   pantoprazole  40 mg Oral BID   [START ON 12/23/2020] potassium chloride  40 mEq Oral Q M,W,F   pravastatin  20 mg Oral Daily   spironolactone  25 mg Oral Daily   tamsulosin  0.4 mg Oral Daily   torsemide  100 mg Oral BID   zolpidem  10 mg Oral QHS   Continuous Infusions:   Anti-infectives (From admission, onward)    None              Family Communication/Anticipated D/C date and plan/Code Status   DVT prophylaxis:  apixaban (ELIQUIS) tablet 5 mg     Code Status: Full Code  Family Communication: None Disposition Plan:    Status is: Observation  The patient will require care spanning > 2 midnights and should be moved to inpatient because: Inpatient level of care appropriate due to severity of illness  Dispo: The patient is from: Home              Anticipated d/c is to: Home              Patient currently is not medically stable to d/c.   Difficult to place patient No           Subjective:   C/o nausea, generalized weakness, general ill feeling and bilateral leg pain.  He said he just does not feel good.  He  attributes this to not getting any gabapentin yesterday.  He said he does not have to ready to go home today.  He wants to stay until Tuesday, 12/24/2020.  Objective:    Vitals:   12/22/20 0000 12/22/20 0002 12/22/20 0340 12/22/20 1047  BP: 121/72  115/69 121/78  Pulse: 75  64 74  Resp: 20  20 19   Temp: 97.8 F (36.6 C)  98.3 F (36.8 C) 97.6 F (36.4 C)  TempSrc: Oral  Oral Oral  SpO2: 97%  98% 97%  Weight:  (!) 166.6 kg    Height:       No data found.   Intake/Output Summary (Last 24 hours) at 12/22/2020 1214 Last data filed at 12/22/2020 0902 Gross per 24 hour  Intake 594 ml  Output 1250 ml  Net -656 ml   Filed Weights   12/21/20 1649 12/22/20 0002  Weight: (!) 167.5 kg (!) 166.6 kg    Exam:  GEN: NAD SKIN: Warm and dry EYES: EOMI ENT: MMM CV: RRR PULM: CTA B ABD: soft, obese, NT, +BS CNS: AAO x 3, non focal EXT: B/l leg edema, erythema and tenderness9 this appears chronic in nature)        Data Reviewed:   I have personally reviewed following labs and imaging studies:  Labs: Labs show the following:   Basic Metabolic Panel: Recent Labs  Lab 12/20/20 1109 12/21/20 0051 12/21/20 0356 12/21/20 0634 12/22/20 0156  NA 136 135 138  --  136  K 3.1* 3.2* 3.8  --  3.3*  CL 95* 95*  --   --  95*  CO2 32 28  --   --  31  GLUCOSE 121* 132*  --   --  122*  BUN 13 14  --   --  11  CREATININE 0.85 0.87  --   --  0.97  CALCIUM 9.0 8.9  --   --  8.7*  MG  --   --   --  2.2 2.0   GFR Estimated Creatinine Clearance: 138.2 mL/min (by C-G formula based on SCr of 0.97 mg/dL). Liver Function Tests: Recent Labs  Lab 12/21/20 0051  AST 28  ALT 17  ALKPHOS 71  BILITOT 1.2  PROT 7.1  ALBUMIN 3.2*   No results for input(s): LIPASE, AMYLASE in the last 168 hours. Recent Labs  Lab 12/21/20 0053 12/22/20 0156  AMMONIA 44* 37*   Coagulation profile Recent Labs  Lab 12/21/20 0051  INR 1.3*    CBC: Recent Labs  Lab 12/21/20 0051 12/21/20 0356  12/22/20 0156  WBC 8.4  --  8.0  NEUTROABS 7.0  --   --   HGB  10.8* 11.9* 10.9*  HCT 36.1* 35.0* 35.8*  MCV 78.8*  --  78.5*  PLT 158  --  155   Cardiac Enzymes: No results for input(s): CKTOTAL, CKMB, CKMBINDEX, TROPONINI in the last 168 hours. BNP (last 3 results) No results for input(s): PROBNP in the last 8760 hours. CBG: Recent Labs  Lab 12/21/20 0241  GLUCAP 115*   D-Dimer: No results for input(s): DDIMER in the last 72 hours. Hgb A1c: No results for input(s): HGBA1C in the last 72 hours. Lipid Profile: No results for input(s): CHOL, HDL, LDLCALC, TRIG, CHOLHDL, LDLDIRECT in the last 72 hours. Thyroid function studies: Recent Labs    12/21/20 0634  TSH 2.475   Anemia work up: Recent Labs    12/21/20 0634  VITAMINB12 399   Sepsis Labs: Recent Labs  Lab 12/21/20 0051 12/22/20 0156  WBC 8.4 8.0    Microbiology Recent Results (from the past 240 hour(s))  Resp Panel by RT-PCR (Flu A&B, Covid) Nasopharyngeal Swab     Status: None   Collection Time: 12/21/20  5:33 AM   Specimen: Nasopharyngeal Swab; Nasopharyngeal(NP) swabs in vial transport medium  Result Value Ref Range Status   SARS Coronavirus 2 by RT PCR NEGATIVE NEGATIVE Final    Comment: (NOTE) SARS-CoV-2 target nucleic acids are NOT DETECTED.  The SARS-CoV-2 RNA is generally detectable in upper respiratory specimens during the acute phase of infection. The lowest concentration of SARS-CoV-2 viral copies this assay can detect is 138 copies/mL. A negative result does not preclude SARS-Cov-2 infection and should not be used as the sole basis for treatment or other patient management decisions. A negative result may occur with  improper specimen collection/handling, submission of specimen other than nasopharyngeal swab, presence of viral mutation(s) within the areas targeted by this assay, and inadequate number of viral copies(<138 copies/mL). A negative result must be combined with clinical  observations, patient history, and epidemiological information. The expected result is Negative.  Fact Sheet for Patients:  EntrepreneurPulse.com.au  Fact Sheet for Healthcare Providers:  IncredibleEmployment.be  This test is no t yet approved or cleared by the [place] FDA and  has been authorized for detection and/or diagnosis of SARS-CoV-2 by FDA under an Emergency Use Authorization (EUA). This EUA will remain  in effect (meaning this test can be used) for the duration of the COVID-19 declaration under Section 564(b)(1) of the Act, 21 U.S.C.section 360bbb-3(b)(1), unless the authorization is terminated  or revoked sooner.       Influenza A by PCR NEGATIVE NEGATIVE Final   Influenza B by PCR NEGATIVE NEGATIVE Final    Comment: (NOTE) The Xpert Xpress SARS-CoV-2/FLU/RSV plus assay is intended as an aid in the diagnosis of influenza from Nasopharyngeal swab specimens and should not be used as a sole basis for treatment. Nasal washings and aspirates are unacceptable for Xpert Xpress SARS-CoV-2/FLU/RSV testing.  Fact Sheet for Patients: EntrepreneurPulse.com.au  Fact Sheet for Healthcare Providers: IncredibleEmployment.be  This test is not yet approved or cleared by the [place] FDA and has been authorized for detection and/or diagnosis of SARS-CoV-2 by FDA under an Emergency Use Authorization (EUA). This EUA will remain in effect (meaning this test can be used) for the duration of the COVID-19 declaration under Section 564(b)(1) of the Act, 21 U.S.C. section 360bbb-3(b)(1), unless the authorization is terminated or revoked.  Performed at [facility], [place] [address], [place], [place] [address]     Procedures and diagnostic studies:  CT HEAD WO CONTRAST (5MM)  Result Date: 12/21/2020 CLINICAL DATA:  Altered mental status EXAM: CT HEAD WITHOUT CONTRAST TECHNIQUE: Contiguous axial  images were obtained from the base of the skull through the vertex without intravenous contrast. COMPARISON:  None. FINDINGS: Brain: Normal anatomic configuration. Parenchymal volume loss is commensurate with the patient's age. Mild periventricular white matter changes are present likely reflecting the sequela of small vessel ischemia. No abnormal intra or extra-axial mass lesion or fluid collection. No abnormal mass effect or midline shift. No evidence of acute intracranial hemorrhage or infarct. Ventricular size is normal. Cerebellum unremarkable. Vascular: No asymmetric hyperdense vasculature at the skull base. Skull: Intact Sinuses/Orbits: Paranasal sinuses are clear. Orbits are unremarkable. Other: Mastoid air cells and middle ear cavities are clear. IMPRESSION: No acute intracranial abnormality.  Mild senescent change. Electronically Signed   By: Fidela Salisbury M.D.   On: 12/21/2020 03:05   DG Chest Port 1 View  Result Date: 12/21/2020 CLINICAL DATA:  Chest pain, generalized weakness EXAM: PORTABLE CHEST 1 VIEW COMPARISON:  10/29/2020 FINDINGS: Lungs are clear. No pneumothorax or pleural effusion. Cardiac size within normal limits. Widening of the right paratracheal stripe relates to mediastinal lipomatosis better seen on CT examination of 06/08/2019. Pulmonary vascularity is normal. No acute bone abnormality. IMPRESSION: No active disease. Electronically Signed   By: Fidela Salisbury M.D.   On: 12/21/2020 02:41               LOS: 0 days   Aby Gessel  [facility]   Pager on [email or web address]. If 7PM-7AM, please contact night-coverage at [email or web address]     12/22/2020, 12:14 PM

## 2020-12-22 NOTE — Evaluation (Signed)
Occupational Therapy Evaluation Patient Details Name: Charles Gray MRN: [id number] DOB: Nov 14, 1955 Today's Date: 12/22/2020    History of Present Illness Pt is a 65 y.o. male who presented to the ED 9/10 with somnolence and confusion after his neighbor found him on the toilet too weak to get up. PMH significant for OSA with CPAP intolerance, chronic pain, depression, anxiety, insomnia, PAF on Eliquis, history of DVT and PE, cirrhosis, and HFpEF.   Clinical Impression   [name] was evaluated s/p the above admission list. PTA pt was required assist with ADLs from his PCA who is with him around 2 hr/day 5 days/wk. He has 20+ STE his home. Upon evaluation pt reported generalized pain, nausea and headache. He fatigues very quickly during functional tasks and requires significantly increased time for all activity. He requires up to max A for ADLs at this time and list limited by pain an shoulder ROM. Pt would benefit from continue OT acutely. Recommend d/c home with [place].     Follow Up Recommendations  Home health OT;Supervision - Intermittent    Equipment Recommendations  None recommended by OT       Precautions / Restrictions Precautions Precautions: Fall Restrictions Weight Bearing Restrictions: No      Mobility Bed Mobility Overal bed mobility: Modified Independent             General bed mobility comments: pt istting on EOB upon arrival    Transfers Overall transfer level: Needs assistance Equipment used: Rolling walker (2 wheeled) Transfers: Sit to/from Stand Sit to Stand: Min guard         General transfer comment: min guard for safety, increased time to power up    Balance Overall balance assessment: Needs assistance Sitting-balance support: No upper extremity supported;Feet supported Sitting balance-Leahy Scale: Good     Standing balance support: Bilateral upper extremity supported;During functional activity;No upper extremity supported Standing  balance-Leahy Scale: Fair Standing balance comment: static stand without UE support, RW for amb                           ADL either performed or assessed with clinical judgement   ADL Overall ADL's : Needs assistance/impaired Eating/Feeding: Set up;Sitting   Grooming: Set up;Sitting   Upper Body Bathing: Sitting;Minimal assistance   Lower Body Bathing: Sit to/from stand;Moderate assistance   Upper Body Dressing : Minimal assistance;Sitting   Lower Body Dressing: Maximal assistance;Sit to/from stand   Toilet Transfer: Comfort height toilet;Grab bars;Minimal assistance Toilet Transfer Details (indicate cue type and reason): min A to boost Toileting- Clothing Manipulation and Hygiene: Maximal assistance;Sit to/from stand       Functional mobility during ADLs: Min guard;Rolling walker General ADL Comments: significantly incrased time for all functional tasks     Vision Baseline Vision/History: 0 No visual deficits Ability to See in Adequate Light: 0 Adequate Vision Assessment?: No apparent visual deficits     Perception     Praxis      Pertinent Vitals/Pain Pain Assessment: Faces Faces Pain Scale: Hurts little more Pain Location: generalized + headache & nausea Pain Descriptors / Indicators: Discomfort;Headache;Grimacing Pain Intervention(s): Limited activity within patient's tolerance;Monitored during session     Hand Dominance Right   Extremity/Trunk Assessment Upper Extremity Assessment Upper Extremity Assessment: RUE deficits/detail;LUE deficits/detail RUE Deficits / Details: shoulder is limited to ~90* flexion due to pain. elbow ROm is [facility]. grip strength is 4/5. all movement is slow and deliberate RUE Sensation: WNL RUE Coordination: decreased  fine motor LUE Deficits / Details: shoulder is limited to ~90* flexion due to pain. elbow ROm is [facility]. grip strength is 4/5. all movement is slow and deliberate LUE Sensation: WNL LUE Coordination: decreased  fine motor   Lower Extremity Assessment Lower Extremity Assessment: Defer to PT evaluation   Cervical / Trunk Assessment Cervical / Trunk Assessment: Kyphotic   Communication Communication Communication: No difficulties   Cognition Arousal/Alertness: Awake/alert Behavior During Therapy: Flat affect;WFL for tasks assessed/performed Overall Cognitive Status: Within Functional Limits for tasks assessed                                     General Comments  VSS on RA, pt complaining of nausea and poor sleep.    Exercises     Shoulder Instructions      Home Living Family/patient expects to be discharged to:: Private residence Living Arrangements: Alone Available Help at Discharge: Personal care attendant Type of Home: Apartment Home Access: Stairs to enter [employer] of Steps: 20+ Entrance Stairs-Rails: Right;Left;Can reach both Home Layout: One level     Bathroom Shower/Tub: Teacher, early years/pre: Handicapped height Bathroom Accessibility: Yes   Home Equipment: Tub bench;Walker - 2 wheels;Hand held shower head;Hospital bed   Additional Comments: Pt reports now not able to stay at home      Prior Functioning/Environment Level of Independence: Needs assistance  Gait / Transfers Assistance Needed: using walker for gait, sleeps in recliner ADL's / Homemaking Assistance Needed: aide assists with getting in/out of tub/bathing and dressing assist 5-6x/wk, assist for homemaking, groceries delivered   Comments: active with HHPT at time of admission        OT Problem List: Decreased strength;Decreased range of motion;Decreased activity tolerance;Impaired balance (sitting and/or standing);Decreased safety awareness;Decreased knowledge of use of DME or AE;Pain      OT Treatment/Interventions: Self-care/ADL training;Therapeutic exercise;Therapeutic activities;Balance training    OT Goals(Current goals can be found in the care plan  section) Acute Rehab OT Goals Patient Stated Goal: feel better OT Goal Formulation: With patient Time For Goal Achievement: 12/22/20 Potential to Achieve Goals: Fair ADL Goals Pt Will Perform Grooming: with supervision;standing Pt Will Perform Upper Body Bathing: with supervision;sitting Pt Will Perform Lower Body Bathing: with min guard assist;sit to/from stand Pt Will Perform Upper Body Dressing: with supervision;sitting Pt Will Perform Lower Body Dressing: with supervision;sit to/from stand Pt Will Transfer to Toilet: with supervision;ambulating;grab bars Pt Will Perform Toileting - Clothing Manipulation and hygiene: with supervision;sitting/lateral leans Pt Will Perform Tub/Shower Transfer: with min guard assist;ambulating;shower seat  OT Frequency: Min 2X/week   Barriers to D/C: Inaccessible home environment  20+ STE       Co-evaluation              AM-PAC OT "6 Clicks" Daily Activity     Outcome Measure Help from another person eating meals?: A Little Help from another person taking care of personal grooming?: A Little Help from another person toileting, which includes using toliet, bedpan, or urinal?: A Lot Help from another person bathing (including washing, rinsing, drying)?: A Lot Help from another person to put on and taking off regular upper body clothing?: A Little Help from another person to put on and taking off regular lower body clothing?: A Lot 6 Click Score: 15   End of Session Nurse Communication: Mobility status;Precautions;Weight bearing status  Activity Tolerance: Patient tolerated treatment well;Patient limited  by pain Patient left: in chair;with call bell/phone within reach  OT Visit Diagnosis: Unsteadiness on feet (R26.81);Other abnormalities of gait and mobility (R26.89);Repeated falls (R29.6);Muscle weakness (generalized) (M62.81);Pain                Time: 1011-1026 OT Time Calculation (min): 15 min Charges:  OT General Charges $OT Visit: 1  Visit OT Evaluation $OT Eval Moderate Complexity: 1 Mod    Joshawn Crissman A Levaughn Puccinelli 12/22/2020, 10:42 AM

## 2020-12-22 NOTE — Care Management Obs Status (Signed)
[place] NOTIFICATION   Patient Details  Name: Charles Gray MRN: [id number] Date of Birth: [birth year]   Medicare Observation Status Notification Given:  Yes    Dawayne Patricia, RN 12/22/2020, 4:02 PM

## 2020-12-23 DIAGNOSIS — G9340 Encephalopathy, unspecified: Secondary | ICD-10-CM | POA: Diagnosis not present

## 2020-12-23 LAB — POTASSIUM
Potassium: 2.8 mmol/L — ABNORMAL LOW (ref 3.5–5.1)
Potassium: 3.3 mmol/L — ABNORMAL LOW (ref 3.5–5.1)

## 2020-12-23 MED ORDER — POTASSIUM CHLORIDE CRYS ER 20 MEQ PO TBCR
40.0000 meq | EXTENDED_RELEASE_TABLET | Freq: Once | ORAL | Status: AC
  Administered 2020-12-23: 40 meq via ORAL
  Filled 2020-12-23: qty 2

## 2020-12-23 MED ORDER — ZOLPIDEM TARTRATE 10 MG PO TABS: 10.0000 mg | ORAL_TABLET | Freq: Every evening | ORAL | 0 refills | Status: DC | PRN

## 2020-12-23 MED ORDER — POTASSIUM CHLORIDE CRYS ER 20 MEQ PO TBCR
40.0000 meq | EXTENDED_RELEASE_TABLET | ORAL | Status: AC
  Administered 2020-12-23 (×2): 40 meq via ORAL
  Filled 2020-12-23: qty 2

## 2020-12-23 MED ORDER — LACTULOSE 10 GM/15ML PO SOLN: 10.0000 g | Freq: Three times a day (TID) | ORAL | 1 refills | Status: DC

## 2020-12-23 NOTE — Discharge Summary (Signed)
Physician Discharge Summary  Charles Gray IEP:[id number] DOB: 02-17-1956 DOA: 12/21/2020  PCP: Enid Skeens., MD  Admit date: 12/21/2020 Discharge date: 12/23/2020  Admitted From: Home Disposition:  Home  Discharge Condition:Stable CODE STATUS:FULL Diet recommendation: Heart Healthy    Brief/Interim Summary:  Charles Gray is a 65 y.o. male with medical history significant for OSA with CPAP intolerance, chronic pain, depression, anxiety, insomnia, PAF on Eliquis, history of DVT and PE, cirrhosis, morbid obesity, chronic diastolic CHF, COPD.  He presented to the hospital because of somnolence, confusion, generalized weakness and chest pain.Troponins were negative.  He was admitted to the hospital for acute toxic encephalopathy that was attributed to probable polypharmacy.  Ambien and gabapentin were held briefly.  However, patient insisted on resuming these medications to help with sleep and pain respectively.  Ambien was recommended to be continued only as needed.  Currently his mental status is at baseline, fully alert and oriented.  He was also found to have mild elevated ammonia level, started on low-dose lactulose.   He does not have any chest pain.  Hospital course remarkable for severe hypokalemia but he is on high-dose potassium supplement  at home which we will recommend to continue. Patient also seen by PT and OT during this hospitalization and recommended home health.  He is medically stable for discharge home today.  I recommended him to follow-up with his PCP in a week.    Discharge Diagnoses:  Principal Problem:   Acute encephalopathy Active Problems:   Hypokalemia   Anemia of chronic disease   Cirrhosis of liver without ascites (HCC)   Chronic pain syndrome   Chronic heart failure with preserved ejection fraction (HCC)   History of pulmonary embolism   Chronic diastolic HF (heart failure) (HCC)   Chest pain   AF (paroxysmal atrial fibrillation) (HCC)   Personal  history of DVT (deep vein thrombosis)   COPD (chronic obstructive pulmonary disease) ([place])    Discharge Instructions  Discharge Instructions     Diet - low sodium heart healthy   Complete by: As directed    Discharge instructions   Complete by: As directed    1)Please take ur medications as instructed 2)Follow with your PCP in a week,do a bmp test during the follow up.   Increase activity slowly   Complete by: As directed       Allergies as of 12/23/2020       Reactions   Penicillins    Broke out in convulsions as a child  Did it involve swelling of the face/tongue/throat, SOB, or low BP? N/A Did it involve sudden or severe rash/hives, skin peeling, or any reaction on the inside of your mouth or nose? N/A Did you need to seek medical attention at a hospital or doctor's office?N/A When did it last happen? N/A If all above answers are "NO", may proceed with cephalosporin use.        Medication List     TAKE these medications    acetaminophen 500 MG tablet Commonly known as: TYLENOL Take 1,000 mg by mouth every 6 (six) hours as needed for moderate pain or headache.   apixaban 5 MG Tabs tablet Commonly known as: Eliquis Take 1 tablet (5 mg total) by mouth 2 (two) times daily.   Buprenorphine HCl-Naloxone HCl 8-2 MG Film Place 1 tablet under the tongue 2 (two) times daily.   eplerenone 50 MG tablet Commonly known as: INSPRA Take 2 tablets (100 mg total) by mouth daily.   gabapentin  800 MG tablet Commonly known as: NEURONTIN Take 1 tablet (800 mg total) by mouth 3 (three) times daily.   lactulose 10 GM/15ML solution Commonly known as: CHRONULAC Take 15 mLs (10 g total) by mouth 3 (three) times daily.   metolazone 2.5 MG tablet Commonly known as: ZAROXOLYN Take 1 tablet (2.5 mg total) by mouth 3 (three) times a week. Monday, Wednesday and Fridays   metoprolol succinate 25 MG 24 hr tablet Commonly known as: Toprol XL Take 1 tablet (25 mg total) by mouth 2  (two) times daily.   ondansetron 4 MG disintegrating tablet Commonly known as: ZOFRAN-ODT TAKE 1 TABLET BY MOUTH EVERY 8 HOURS AS NEEDED FOR NAUSEA OR VOMITING   pantoprazole 40 MG tablet Commonly known as: PROTONIX Take 40 mg by mouth 2 (two) times daily.   polyethylene glycol powder 17 GM/SCOOP powder Commonly known as: GLYCOLAX/MIRALAX Take 17 g by mouth daily. What changed:  when to take this reasons to take this   potassium chloride SA 20 MEQ tablet Commonly known as: KLOR-CON Take 4 tablets (80 mEq total) by mouth 2 (two) times daily. Extra 40 mEq with metolazone days.   pravastatin 20 MG tablet Commonly known as: PRAVACHOL Take 1 tablet (20 mg total) by mouth daily.   sertraline 25 MG tablet Commonly known as: ZOLOFT TAKE 1 TABLET BY MOUTH DAILY   sodium chloride 0.65 % Soln nasal spray Commonly known as: OCEAN Place 1 spray into both nostrils as needed for congestion.   tamsulosin 0.4 MG Caps capsule Commonly known as: FLOMAX Take 1 capsule (0.4 mg total) by mouth daily.   torsemide 100 MG tablet Commonly known as: DEMADEX Take 1 tablet (100 mg total) by mouth 2 (two) times daily.   Ventolin HFA 108 (90 Base) MCG/ACT inhaler Generic drug: albuterol Inhale 1 puff into the lungs every 4 (four) hours as needed for shortness of breath.   zolpidem 10 MG tablet Commonly known as: AMBIEN Take 1 tablet (10 mg total) by mouth at bedtime as needed for sleep. What changed:  when to take this reasons to take this        [place], [place] Follow up.   Specialty: Home Health Services Why: [place], [place] information: [address] [place] [address] [phone]         Enid Skeens., MD. Schedule an appointment as soon as possible for a visit in 1 week(s).   Specialty: Family Medicine Contact information: [address] [place] [address] [phone]                Allergies  Allergen  Reactions   Penicillins     Broke out in convulsions as a child  Did it involve swelling of the face/tongue/throat, SOB, or low BP? N/A Did it involve sudden or severe rash/hives, skin peeling, or any reaction on the inside of your mouth or nose? N/A Did you need to seek medical attention at a hospital or doctor's office?N/A When did it last happen? N/A If all above answers are "NO", may proceed with cephalosporin use.    Consultations: None   Procedures/Studies: CT HEAD WO CONTRAST (5MM)  Result Date: 12/21/2020 CLINICAL DATA:  Altered mental status EXAM: CT HEAD WITHOUT CONTRAST TECHNIQUE: Contiguous axial images were obtained from the base of the skull through the vertex without intravenous contrast. COMPARISON:  None. FINDINGS: Brain: Normal anatomic configuration. Parenchymal volume loss is commensurate with the patient's  age. Mild periventricular white matter changes are present likely reflecting the sequela of small vessel ischemia. No abnormal intra or extra-axial mass lesion or fluid collection. No abnormal mass effect or midline shift. No evidence of acute intracranial hemorrhage or infarct. Ventricular size is normal. Cerebellum unremarkable. Vascular: No asymmetric hyperdense vasculature at the skull base. Skull: Intact Sinuses/Orbits: Paranasal sinuses are clear. Orbits are unremarkable. Other: Mastoid air cells and middle ear cavities are clear. IMPRESSION: No acute intracranial abnormality.  Mild senescent change. Electronically Signed   By: Fidela Salisbury M.D.   On: 12/21/2020 03:05   DG Chest Port 1 View  Result Date: 12/21/2020 CLINICAL DATA:  Chest pain, generalized weakness EXAM: PORTABLE CHEST 1 VIEW COMPARISON:  10/29/2020 FINDINGS: Lungs are clear. No pneumothorax or pleural effusion. Cardiac size within normal limits. Widening of the right paratracheal stripe relates to mediastinal lipomatosis better seen on CT examination of 06/08/2019. Pulmonary vascularity is normal.  No acute bone abnormality. IMPRESSION: No active disease. Electronically Signed   By: Fidela Salisbury M.D.   On: 12/21/2020 02:41      Subjective: Patient seen and examined the bedside this morning.  Medically stable for discharge today.  Discharge Exam: Vitals:   12/23/20 0446 12/23/20 1134  BP: 99/60 120/70  Pulse: 66 62  Resp: 19 18  Temp: 97.7 F (36.5 C)   SpO2: 97% 97%   Vitals:   12/22/20 1047 12/22/20 1941 12/23/20 0446 12/23/20 1134  BP: 121/78 107/68 99/60 120/70  Pulse: 74 65 66 62  Resp: 19 20 19 18   Temp: 97.6 F (36.4 C) 97.8 F (36.6 C) 97.7 F (36.5 C)   TempSrc: Oral Oral Oral   SpO2: 97% 98% 97% 97%  Weight:   (!) 165.6 kg   Height:        General: Pt is alert, awake, not in acute distress, morbidly obese Cardiovascular: RRR, S1/S2 +, no rubs, no gallops Respiratory: CTA bilaterally, no wheezing, no rhonchi Abdominal: Soft, NT, ND, bowel sounds + Extremities: no edema, no cyanosis    The results of significant diagnostics from this hospitalization (including imaging, microbiology, ancillary and laboratory) are listed below for reference.     Microbiology: Recent Results (from the past 240 hour(s))  Resp Panel by RT-PCR (Flu A&B, Covid) Nasopharyngeal Swab     Status: None   Collection Time: 12/21/20  5:33 AM   Specimen: Nasopharyngeal Swab; Nasopharyngeal(NP) swabs in vial transport medium  Result Value Ref Range Status   SARS Coronavirus 2 by RT PCR NEGATIVE NEGATIVE Final    Comment: (NOTE) SARS-CoV-2 target nucleic acids are NOT DETECTED.  The SARS-CoV-2 RNA is generally detectable in upper respiratory specimens during the acute phase of infection. The lowest concentration of SARS-CoV-2 viral copies this assay can detect is 138 copies/mL. A negative result does not preclude SARS-Cov-2 infection and should not be used as the sole basis for treatment or other patient management decisions. A negative result may occur with  improper specimen  collection/handling, submission of specimen other than nasopharyngeal swab, presence of viral mutation(s) within the areas targeted by this assay, and inadequate number of viral copies(<138 copies/mL). A negative result must be combined with clinical observations, patient history, and epidemiological information. The expected result is Negative.  Fact Sheet for Patients:  EntrepreneurPulse.com.au  Fact Sheet for Healthcare Providers:  IncredibleEmployment.be  This test is no t yet approved or cleared by the [place] FDA and  has been authorized for detection and/or diagnosis of SARS-CoV-2 by FDA  under an Emergency Use Authorization (EUA). This EUA will remain  in effect (meaning this test can be used) for the duration of the COVID-19 declaration under Section 564(b)(1) of the Act, 21 U.S.C.section 360bbb-3(b)(1), unless the authorization is terminated  or revoked sooner.       Influenza A by PCR NEGATIVE NEGATIVE Final   Influenza B by PCR NEGATIVE NEGATIVE Final    Comment: (NOTE) The Xpert Xpress SARS-CoV-2/FLU/RSV plus assay is intended as an aid in the diagnosis of influenza from Nasopharyngeal swab specimens and should not be used as a sole basis for treatment. Nasal washings and aspirates are unacceptable for Xpert Xpress SARS-CoV-2/FLU/RSV testing.  Fact Sheet for Patients: EntrepreneurPulse.com.au  Fact Sheet for Healthcare Providers: IncredibleEmployment.be  This test is not yet approved or cleared by the [place] FDA and has been authorized for detection and/or diagnosis of SARS-CoV-2 by FDA under an Emergency Use Authorization (EUA). This EUA will remain in effect (meaning this test can be used) for the duration of the COVID-19 declaration under Section 564(b)(1) of the Act, 21 U.S.C. section 360bbb-3(b)(1), unless the authorization is terminated or revoked.  Performed at [facility], [place] [address] [place], [place] [address]      Labs: BNP (last 3 results) Recent Labs    10/17/20 1050 10/28/20 1338 12/21/20 0052  BNP 49.9 28.1 75.6   Basic Metabolic Panel: Recent Labs  Lab 12/20/20 1109 12/21/20 0051 12/21/20 0356 12/21/20 0634 12/22/20 0156 12/23/20 0432 12/23/20 1214  NA 136 135 138  --  136  --   --   K 3.1* 3.2* 3.8  --  3.3* 2.8* 3.3*  CL 95* 95*  --   --  95*  --   --   CO2 32 28  --   --  31  --   --   GLUCOSE 121* 132*  --   --  122*  --   --   BUN 13 14  --   --  11  --   --   CREATININE 0.85 0.87  --   --  0.97  --   --   CALCIUM 9.0 8.9  --   --  8.7*  --   --   MG  --   --   --  2.2 2.0  --   --    Liver Function Tests: Recent Labs  Lab 12/21/20 0051  AST 28  ALT 17  ALKPHOS 71  BILITOT 1.2  PROT 7.1  ALBUMIN 3.2*   No results for input(s): LIPASE, AMYLASE in the last 168 hours. Recent Labs  Lab 12/21/20 0053 12/22/20 0156  AMMONIA 44* 37*   CBC: Recent Labs  Lab 12/21/20 0051 12/21/20 0356 12/22/20 0156  WBC 8.4  --  8.0  NEUTROABS 7.0  --   --   HGB 10.8* 11.9* 10.9*  HCT 36.1* 35.0* 35.8*  MCV 78.8*  --  78.5*  PLT 158  --  155   Cardiac Enzymes: No results for input(s): CKTOTAL, CKMB, CKMBINDEX, TROPONINI in the last 168 hours. BNP: Invalid input(s): POCBNP CBG: Recent Labs  Lab 12/21/20 0241  GLUCAP 115*   D-Dimer No results for input(s): DDIMER in the last 72 hours. Hgb A1c No results for input(s): HGBA1C in the last 72 hours. Lipid Profile No results for input(s): CHOL, HDL, LDLCALC, TRIG, CHOLHDL, LDLDIRECT in the last 72 hours. Thyroid function studies Recent Labs    12/21/20 0634  TSH 2.475  Anemia work up Recent Labs    12/21/20 0634  VITAMINB12 399   Urinalysis    Component Value Date/Time   COLORURINE YELLOW 12/21/2020 0104   APPEARANCEUR CLEAR 12/21/2020 0104   LABSPEC 1.010 12/21/2020 0104   PHURINE 6.5 12/21/2020 0104   GLUCOSEU NEGATIVE 12/21/2020 0104    HGBUR NEGATIVE 12/21/2020 0104   BILIRUBINUR NEGATIVE 12/21/2020 0104   KETONESUR NEGATIVE 12/21/2020 0104   PROTEINUR NEGATIVE 12/21/2020 0104   NITRITE NEGATIVE 12/21/2020 0104   LEUKOCYTESUR NEGATIVE 12/21/2020 0104   Sepsis Labs Invalid input(s): PROCALCITONIN,  WBC,  LACTICIDVEN Microbiology Recent Results (from the past 240 hour(s))  Resp Panel by RT-PCR (Flu A&B, Covid) Nasopharyngeal Swab     Status: None   Collection Time: 12/21/20  5:33 AM   Specimen: Nasopharyngeal Swab; Nasopharyngeal(NP) swabs in vial transport medium  Result Value Ref Range Status   SARS Coronavirus 2 by RT PCR NEGATIVE NEGATIVE Final    Comment: (NOTE) SARS-CoV-2 target nucleic acids are NOT DETECTED.  The SARS-CoV-2 RNA is generally detectable in upper respiratory specimens during the acute phase of infection. The lowest concentration of SARS-CoV-2 viral copies this assay can detect is 138 copies/mL. A negative result does not preclude SARS-Cov-2 infection and should not be used as the sole basis for treatment or other patient management decisions. A negative result may occur with  improper specimen collection/handling, submission of specimen other than nasopharyngeal swab, presence of viral mutation(s) within the areas targeted by this assay, and inadequate number of viral copies(<138 copies/mL). A negative result must be combined with clinical observations, patient history, and epidemiological information. The expected result is Negative.  Fact Sheet for Patients:  EntrepreneurPulse.com.au  Fact Sheet for Healthcare Providers:  IncredibleEmployment.be  This test is no t yet approved or cleared by the [place] FDA and  has been authorized for detection and/or diagnosis of SARS-CoV-2 by FDA under an Emergency Use Authorization (EUA). This EUA will remain  in effect (meaning this test can be used) for the duration of the COVID-19 declaration under  Section 564(b)(1) of the Act, 21 U.S.C.section 360bbb-3(b)(1), unless the authorization is terminated  or revoked sooner.       Influenza A by PCR NEGATIVE NEGATIVE Final   Influenza B by PCR NEGATIVE NEGATIVE Final    Comment: (NOTE) The Xpert Xpress SARS-CoV-2/FLU/RSV plus assay is intended as an aid in the diagnosis of influenza from Nasopharyngeal swab specimens and should not be used as a sole basis for treatment. Nasal washings and aspirates are unacceptable for Xpert Xpress SARS-CoV-2/FLU/RSV testing.  Fact Sheet for Patients: EntrepreneurPulse.com.au  Fact Sheet for Healthcare Providers: IncredibleEmployment.be  This test is not yet approved or cleared by the [place] FDA and has been authorized for detection and/or diagnosis of SARS-CoV-2 by FDA under an Emergency Use Authorization (EUA). This EUA will remain in effect (meaning this test can be used) for the duration of the COVID-19 declaration under Section 564(b)(1) of the Act, 21 U.S.C. section 360bbb-3(b)(1), unless the authorization is terminated or revoked.  Performed at [facility], [place] [address], [place], [place] [address]     Please note: You were cared for by a hospitalist during your hospital stay. Once you are discharged, your primary care physician will handle any further medical issues. Please note that NO REFILLS for any discharge medications will be authorized once you are discharged, as it is imperative that you return to your primary care physician (or establish a relationship with a primary care  physician if you do not have one) for your post hospital discharge needs so that they can reassess your need for medications and monitor your lab values.    Time coordinating discharge: 40 minutes  SIGNED:   Shelly Coss, MD  [facility] 12/23/2020, 12:59 PM Pager [phone]  If 7PM-7AM, please contact night-coverage [email or web address] Password  [email or web address]

## 2020-12-23 NOTE — Progress Notes (Signed)
[place] visited pt. per [place] for assistance w/AD.  Pt. lying in bed watching TV when [facility] arrived; pt. seemed somewhat drowsy at time of visit; pt. says he is not interested in completing AD at this time.  Chaplains remain available as needed.  Lindaann Pascal, Chaplain Pager: [phone]

## 2020-12-23 NOTE — Progress Notes (Signed)
HF CSW and HF RN Navigator spoke with Mr. Giuliani at bedside. CSW provided mental health resources to Mr. Frappier due to paramedicine encouraging patient to participate in outpatient therapy services and Jaryn being open to considering talking with someone. Jalynn reported that he attended group therapy years ago and would be interested in doing group therapy again. CSW provided a list of places including [facility] and several locations near Mr. Brisco home as well. [place] is scheduled for discharge today and is involved with paramedicine.   Hartley Wyke, MSW, [place] Heart Failure Social Worker

## 2020-12-23 NOTE — TOC Transition Note (Signed)
Transition of Care [facility]) - CM/SW Discharge Note   Patient Details  Name: Mounir Skipper MRN: [id number] Date of Birth: 08/30/1955  Transition [facility] CM/SW Contact:  Zenon Mayo, RN Phone Number: 12/23/2020, 1:35 PM   Clinical Narrative:    Patient is for dc today, NCM notified Stacie with [place].  He is active with paramedicine as well.  He is set up with [facility] [employer] for transportation services to his [place] apts and when he needs transport.    Final next level of care: [place] Barriers to Discharge: No Barriers Identified   Patient Goals and CMS Choice Patient states their goals for this hospitalization and ongoing recovery are:: return home with [facility] CMS Medicare.gov Compare Post Acute Care list provided to:: Patient Choice offered to / list presented to : Patient  Discharge Placement                       Discharge Plan and Services                  DME Agency: NA       HH Arranged: PT HH Agency: [place] Date [place]: 12/23/20 Time [place]: 55 Representative spoke with at [place]: [place] ([place]) Interventions     Readmission Risk Interventions Readmission Risk Prevention Plan 09/26/2020 06/12/2020 03/27/2020  Transportation Screening Complete Complete Complete  HRI or [place] for [place] Planning/Counseling - - -  Medication Review Press photographer) Complete Complete Complete  PCP or Specialist appointment within 3-5 days of discharge Complete - Complete  HRI or [place] Complete Complete Complete  SW Recovery Care/Counseling Consult Complete Complete Complete  Palliative Care Screening Not Applicable Not Applicable Not [place] Not Applicable Not Applicable Not Applicable

## 2020-12-23 NOTE — Progress Notes (Signed)
OT Cancellation Note  Patient Details Name: Charles Gray MRN: [id number] DOB: September 25, 1955   Cancelled Treatment:    Reason Eval/Treat Not Completed: Other (comment) Pt had just received lunch tray on OT entry. Will follow-up for OT session this PM - pt agreeable.   [name] 12/23/2020, 12:31 PM

## 2020-12-23 NOTE — Progress Notes (Signed)
Occupational Therapy Treatment Patient Details Name: Charles Gray MRN: [id number] DOB: 01-17-1956 Today's Date: 12/23/2020   History of present illness Pt is a 65 y.o. male who presented to the ED 9/10 with somnolence and confusion after his neighbor found him on the toilet too weak to get up. PMH significant for OSA with CPAP intolerance, chronic pain, depression, anxiety, insomnia, PAF on Eliquis, history of DVT and PE, cirrhosis, and HFpEF.   OT comments  Pt progressing well towards OT goals, able to mobilize in room with RW without physical assist. Distance limited by chronic back pain. Pt able to demo strategies for LB dressing tasks at Supervision level. Provided education on use of leg lifter for increased ease of tub transfers with bench due to difficulty raising legs for task. DC recs remain appropriate. VSS on RA.    Recommendations for follow up therapy are one component of a multi-disciplinary discharge planning process, led by the attending physician.  Recommendations may be updated based on patient status, additional functional criteria and insurance authorization.    Follow Up Recommendations  Home health OT;Supervision - Intermittent    Equipment Recommendations  None recommended by OT    Recommendations for Other Services      Precautions / Restrictions Precautions Precautions: Fall Restrictions Weight Bearing Restrictions: No       Mobility Bed Mobility Overal bed mobility: Modified Independent             General bed mobility comments: sitting EOB on entry    Transfers Overall transfer level: Independent Equipment used: None Transfers: Sit to/from Stand Sit to Stand: Independent         General transfer comment: no assist needed for standing without AD standing at bedside, use of RW for mobility    Balance Overall balance assessment: Needs assistance Sitting-balance support: No upper extremity supported;Feet supported Sitting balance-Leahy  Scale: Good     Standing balance support: Bilateral upper extremity supported;During functional activity;No upper extremity supported Standing balance-Leahy Scale: Fair Standing balance comment: static stand without UE support, RW for amb                           ADL either performed or assessed with clinical judgement   ADL Overall ADL's : Needs assistance/impaired                     Lower Body Dressing: Supervision/safety;Sit to/from stand;Sitting/lateral leans Lower Body Dressing Details (indicate cue type and reason): able to demo ability to cross LE to reach B feet to manage socks. No issues in standing that would indicate dificulties managing clothing over waist             Functional mobility during ADLs: Supervision/safety;Rolling walker General ADL Comments: Pt able to demo multiple laps in room without LOB or SOB. VSS on RA. Pt does endorse some chronic back pain with prolonged standing activity Pt does endorse difficulty getting LE in out of tub despite having tub bench. demo use of gait belt as leg lifter (pt has at home) and able to return demo its use sitting EOB. Pt does endorse some concerns about being able to feed cat and manage litter box - began problem solving this with pt     Vision   Vision Assessment?: No apparent visual deficits   Perception     Praxis      Cognition Arousal/Alertness: Awake/alert Behavior During Therapy: WFL for tasks assessed/performed Overall Cognitive  Status: Within Functional Limits for tasks assessed                                 General Comments: some tangential conversation        Exercises     Shoulder Instructions       General Comments VSS on RA    Pertinent Vitals/ Pain       Pain Assessment: Faces Faces Pain Scale: Hurts little more Pain Location: back after mobility Pain Descriptors / Indicators: Sore Pain Intervention(s): Monitored during session  Home Living                                           Prior Functioning/Environment              Frequency  Min 2X/week        Progress Toward Goals  OT Goals(current goals can now be found in the care plan section)  Progress towards OT goals: Progressing toward goals  Acute Rehab OT Goals Patient Stated Goal: feel better OT Goal Formulation: With patient Time For Goal Achievement: 01/05/21 Potential to Achieve Goals: Fair ADL Goals Pt Will Perform Grooming: with supervision;standing Pt Will Perform Upper Body Bathing: with supervision;sitting Pt Will Perform Lower Body Bathing: with min guard assist;sit to/from stand Pt Will Perform Upper Body Dressing: with supervision;sitting Pt Will Perform Lower Body Dressing: with supervision;sit to/from stand Pt Will Transfer to Toilet: with supervision;ambulating;grab bars Pt Will Perform Toileting - Clothing Manipulation and hygiene: with supervision;sitting/lateral leans Pt Will Perform Tub/Shower Transfer: with min guard assist;ambulating;shower seat  Plan Discharge plan remains appropriate    Co-evaluation                 AM-PAC OT "6 Clicks" Daily Activity     Outcome Measure   Help from another person eating meals?: A Little Help from another person taking care of personal grooming?: A Little Help from another person toileting, which includes using toliet, bedpan, or urinal?: A Little Help from another person bathing (including washing, rinsing, drying)?: A Little Help from another person to put on and taking off regular upper body clothing?: A Little Help from another person to put on and taking off regular lower body clothing?: A Little 6 Click Score: 18    End of Session Equipment Utilized During Treatment: Rolling walker  OT Visit Diagnosis: Unsteadiness on feet (R26.81);Other abnormalities of gait and mobility (R26.89);Repeated falls (R29.6);Muscle weakness (generalized) (M62.81);Pain   Activity  Tolerance Patient tolerated treatment well;Patient limited by pain   Patient Left in bed;with call bell/phone within reach   Nurse Communication          Time: 5945-8592 OT Time Calculation (min): 24 min  Charges: OT General Charges $OT Visit: 1 Visit OT Treatments $Self Care/Home Management : 8-22 mins  Charles Gray, OTR/L Acute Rehab Services Office: [phone]   [name] 12/23/2020, 1:37 PM

## 2020-12-23 NOTE — TOC Progression Note (Addendum)
Transition of Care [facility]) - Progression Note    Patient Details  Name: Charles Gray MRN: [id number] Date of Birth: Nov 08, 1955  Transition of Care [facility]) CM/SW Contact  Zenon Mayo, RN Phone Number: 12/23/2020, 9:34 AM  Clinical Narrative:    NCM spoke with patient, he is active with [facility] for [place], [place], will need new orders.  He will use [facility] [employer] for transportation, he states he will call them.  They take him to his MD apts as well.  He states he has a Marine scientist that comes out on Tuesday with Paramedicine also.  He goes to the HF clinic.         Expected Discharge Plan and Services                                                 Social Determinants of Health (SDOH) Interventions    Readmission Risk Interventions Readmission Risk Prevention Plan 09/26/2020 06/12/2020 03/27/2020  Transportation Screening Complete Complete Complete  HRI or [place] for [place] Planning/Counseling - - -  Medication Review Press photographer) Complete Complete Complete  PCP or Specialist appointment within 3-5 days of discharge Complete - Complete  HRI or [place] Complete Complete Complete  SW Recovery Care/Counseling Consult Complete Complete Complete  Palliative Care Screening Not Applicable Not Applicable Not [place] Not Applicable Not Applicable Not Applicable

## 2020-12-23 NOTE — Progress Notes (Signed)
   12/23/20 0910  Mobility  Activity Ambulated in room;Dangled on edge of bed  Range of Motion/Exercises Active;All extremities  Level of Assistance Contact guard assist, steadying assist  Assistive Device Front wheel walker  Minutes Stood 2 minutes  Minutes Ambulated 2 minutes  Distance Ambulated (ft) 25 ft  Mobility Ambulated with assistance in room  Mobility Response Tolerated well  Mobility performed by Mobility specialist  Bed Position Semi-fowlers  $Mobility charge 1 Mobility

## 2020-12-23 NOTE — Telephone Encounter (Signed)
Pt sent mychart message after business hours on Friday. Pt currently admitted.

## 2020-12-24 ENCOUNTER — Other Ambulatory Visit (HOSPITAL_COMMUNITY): Payer: Self-pay

## 2020-12-24 NOTE — Progress Notes (Signed)
Paramedicine Encounter    Patient ID: Charles Gray, male    DOB: [birth year], 65 y.o.   MRN: [id number]   Patient Care Team: Enid Skeens., MD as PCP - General (Family Medicine) Berniece Salines, DO as PCP - Cardiology (Cardiology) Jorge Ny, LCSW as Social Worker (Licensed Clinical Social Worker)  Patient Active Problem List   Diagnosis Date Noted   Acute encephalopathy 12/21/2020   COPD (chronic obstructive pulmonary disease) ([place]) 12/21/2020   Chronic diastolic (congestive) heart failure ([place]) 11/14/2020   Syncope 10/28/2020   CHF (congestive heart failure) ([place]) 10/28/2020   Chronic venous stasis dermatitis of both lower extremities 08/27/2020   Cellulitis of both lower extremities 08/14/2020   Microcytic anemia 08/14/2020   GERD without esophagitis 06/10/2020   Atrial fibrillation with rapid ventricular response ([place]) 03/24/2020   Acute on chronic right heart failure ([place]) 01/27/2020   Acute respiratory failure with hypoxia ([place])    Fatty liver disease, nonalcoholic    Shock ([place])    Personal history of DVT (deep vein thrombosis) 12/26/2019   Chronic anticoagulation 12/26/2019   Palpitations 12/26/2019   AF (paroxysmal atrial fibrillation) ([place]) 12/23/2019   Heart failure, systolic, acute ([place]) 25/08/3974   Acute on chronic congestive heart failure (HCC)    Chronic diastolic HF (heart failure) ([place]) 08/23/2019   Chest pain 08/23/2019   Lactic acidosis 08/23/2019   History of pulmonary embolism 08/12/2019   Atrial tachycardia ([place]) 08/12/2019   Occasional tremors 08/12/2019   OSA (obstructive sleep apnea) 08/12/2019   Acute on chronic right-sided heart failure ([place]) 08/06/2019   Pulmonary embolism ([place]) 06/08/2019   Normocytic anemia 06/08/2019   Pulmonary emboli ([place]) 06/08/2019   Chronic heart failure with preserved ejection fraction ([place]) 05/30/2019   Class 2 severe obesity due to excess calories with serious comorbidity and body mass index (BMI) of 39.0 to  39.9 in adult ([place]) 05/30/2019   Morbid obesity with BMI of 40.0-44.9, adult ([place]) 05/30/2019   Hyponatremia    Drug induced constipation    Peripheral edema    Hypotension due to drugs    Hypoalbuminemia due to protein-calorie malnutrition (HCC)    Hypokalemia    Thrombocytopenia (HCC)    Anemia of chronic disease    Cirrhosis of liver without ascites (HCC)    Chronic pain syndrome    Debility 03/13/2019   Portal hypertensive gastropathy ([place]) 03/06/2019   Dyspnea    GIB (gastrointestinal bleeding) 03/04/2019   Mixed hyperlipidemia 01/24/2019   Insomnia 01/24/2019   Hyperlipidemia 01/24/2019   Essential hypertension 01/24/2019   Lumbar disc herniation 01/24/2019    Current Outpatient Medications:    acetaminophen (TYLENOL) 500 MG tablet, Take 1,000 mg by mouth every 6 (six) hours as needed for moderate pain or headache., Disp: , Rfl:    apixaban (ELIQUIS) 5 MG TABS tablet, Take 1 tablet (5 mg total) by mouth 2 (two) times daily., Disp: 60 tablet, Rfl: 0   Buprenorphine HCl-Naloxone HCl 8-2 MG FILM, Place 1 tablet under the tongue 2 (two) times daily., Disp: , Rfl:    eplerenone (INSPRA) 50 MG tablet, Take 2 tablets (100 mg total) by mouth daily., Disp: 60 tablet, Rfl: 3   gabapentin (NEURONTIN) 800 MG tablet, Take 1 tablet (800 mg total) by mouth 3 (three) times daily., Disp: 90 tablet, Rfl: 1   lactulose (CHRONULAC) 10 GM/15ML solution, Take 15 mLs (10 g total) by mouth 3 (three) times daily., Disp: 946 mL, Rfl: 1   metolazone (ZAROXOLYN) 2.5 MG tablet,  Take 1 tablet (2.5 mg total) by mouth 3 (three) times a week. Monday, Wednesday and Fridays, Disp: 14 tablet, Rfl: 0   metoprolol succinate (TOPROL XL) 25 MG 24 hr tablet, Take 1 tablet (25 mg total) by mouth 2 (two) times daily., Disp: 60 tablet, Rfl: 2   ondansetron (ZOFRAN-ODT) 4 MG disintegrating tablet, TAKE 1 TABLET BY MOUTH EVERY 8 HOURS AS NEEDED FOR NAUSEA OR VOMITING, Disp: 20 tablet, Rfl: 0   pantoprazole (PROTONIX) 40 MG  tablet, Take 40 mg by mouth 2 (two) times daily., Disp: , Rfl:    polyethylene glycol powder (GLYCOLAX/MIRALAX) 17 GM/SCOOP powder, Take 17 g by mouth daily. (Patient taking differently: Take 17 g by mouth daily as needed for mild constipation or moderate constipation.), Disp: 255 g, Rfl: 0   potassium chloride SA (KLOR-CON) 20 MEQ tablet, Take 4 tablets (80 mEq total) by mouth 2 (two) times daily. Extra 40 mEq with metolazone days., Disp: 260 tablet, Rfl: 0   pravastatin (PRAVACHOL) 20 MG tablet, Take 1 tablet (20 mg total) by mouth daily., Disp: 30 tablet, Rfl: 1   sertraline (ZOLOFT) 25 MG tablet, TAKE 1 TABLET BY MOUTH DAILY, Disp: 30 tablet, Rfl: 0   sodium chloride (OCEAN) 0.65 % SOLN nasal spray, Place 1 spray into both nostrils as needed for congestion., Disp: , Rfl:    tamsulosin (FLOMAX) 0.4 MG CAPS capsule, Take 1 capsule (0.4 mg total) by mouth daily., Disp: 30 capsule, Rfl: 0   torsemide (DEMADEX) 100 MG tablet, Take 1 tablet (100 mg total) by mouth 2 (two) times daily., Disp: 60 tablet, Rfl: 3   VENTOLIN HFA 108 (90 Base) MCG/ACT inhaler, Inhale 1 puff into the lungs every 4 (four) hours as needed for shortness of breath., Disp: 6.7 g, Rfl: 1   zolpidem (AMBIEN) 10 MG tablet, Take 1 tablet (10 mg total) by mouth at bedtime as needed for sleep., Disp: 30 tablet, Rfl: 0 Allergies  Allergen Reactions   Penicillins     Broke out in convulsions as a child  Did it involve swelling of the face/tongue/throat, SOB, or low BP? N/A Did it involve sudden or severe rash/hives, skin peeling, or any reaction on the inside of your mouth or nose? N/A Did you need to seek medical attention at a hospital or doctor's office?N/A When did it last happen? N/A If all above answers are "NO", may proceed with cephalosporin use.     Social History   Socioeconomic History   Marital status: Single    Spouse name: Not on file   Number of children: Not on file   Years of education: Not on file   Highest  education level: Not on file  Occupational History   Not on file  Tobacco Use   Smoking status: Never   Smokeless tobacco: Never  Vaping Use   Vaping Use: Never used  Substance and Sexual Activity   Alcohol use: Not Currently   Drug use: Not Currently    Types: Marijuana    Comment: Smoked for 30 years   Sexual activity: Not on file  Other Topics Concern   Not on file  Social History Narrative   Not on file   Social Determinants of Health   Financial Resource Strain: Medium Risk   Difficulty of Paying Living Expenses: Somewhat hard  Food Insecurity: No Food Insecurity   Worried About Running Out of Food in the Last Year: Never true   Ran Out of Food in the Last Year: Never  true  Transportation Needs: Unmet Transportation Needs   Lack of Transportation (Medical): Yes   Lack of Transportation (Non-Medical): No  Physical Activity: Not on file  Stress: Not on file  Social Connections: Not on file  Intimate Partner Violence: Not on file    Physical Exam Vitals reviewed.  Constitutional:      Appearance: Normal appearance. He is normal weight.  HENT:     Head: Normocephalic.     Nose: Nose normal.     Mouth/Throat:     Mouth: Mucous membranes are moist.     Pharynx: Oropharynx is clear.  Eyes:     Conjunctiva/sclera: Conjunctivae normal.     Pupils: Pupils are equal, round, and reactive to light.  Cardiovascular:     Rate and Rhythm: Normal rate and regular rhythm.     Pulses: Normal pulses.     Heart sounds: Normal heart sounds.  Pulmonary:     Effort: Pulmonary effort is normal.     Breath sounds: Normal breath sounds.  Abdominal:     General: Abdomen is flat.     Palpations: Abdomen is soft.  Musculoskeletal:        General: Swelling present. Normal range of motion.     Cervical back: Normal range of motion.     Right lower leg: Edema present.     Left lower leg: Edema present.  Skin:    General: Skin is warm and dry.     Capillary Refill: Capillary  refill takes less than 2 seconds.  Neurological:     General: No focal deficit present.     Mental Status: He is alert. Mental status is at baseline.  Psychiatric:        Mood and Affect: Mood normal.    Arrived for home visit for Charles Gray who reports feeling tired and bad this morning. Charles Gray states while he was admitted he was feeling fine but now returning home he feels bad but he thinks this is due to not having his new Lactulose medication yet and missing some doses of his meds while in the hospital.   We reviewed discharge summary and reviewed medications.   Vitals and assessment as noted. Legs swollen with redness.   Charles Gray reports he has a PCP appointment on Thursday at 2:00. I advised him to ask PCP to draw his A1C due to frequent thirst and dry mouth.   We reviewed HF symptoms and diet along with need for PT and increase in exercise. He agreed. PCS still coming out for Charles Gray daily around 1500.   Home visit complete.   Refills: [facility]       Future Appointments  Date Time Provider [place]  01/10/2021 10:00 AM [facility] PA/NP [facility] None     ACTION: Home visit completed

## 2020-12-26 ENCOUNTER — Telehealth (HOSPITAL_COMMUNITY): Payer: Self-pay

## 2020-12-26 NOTE — Telephone Encounter (Signed)
Called in meds for refill to [facility]:  -[facility]

## 2020-12-28 ENCOUNTER — Telehealth: Payer: Self-pay | Admitting: Internal Medicine

## 2020-12-31 ENCOUNTER — Other Ambulatory Visit (HOSPITAL_COMMUNITY): Payer: Self-pay

## 2020-12-31 NOTE — Progress Notes (Signed)
Paramedicine Encounter    Patient ID: Charles Gray, male    DOB: 15-Jul-1955, 65 y.o.   MRN: [id number]   Patient Care Team: Enid Skeens., MD as PCP - General (Family Medicine) Berniece Salines, DO as PCP - Cardiology (Cardiology) Jorge Ny, LCSW as Social Worker (Licensed Clinical Social Worker)  Patient Active Problem List   Diagnosis Date Noted   Acute encephalopathy 12/21/2020   COPD (chronic obstructive pulmonary disease) ([place]) 12/21/2020   Chronic diastolic (congestive) heart failure ([place]) 11/14/2020   Syncope 10/28/2020   CHF (congestive heart failure) ([place]) 10/28/2020   Chronic venous stasis dermatitis of both lower extremities 08/27/2020   Cellulitis of both lower extremities 08/14/2020   Microcytic anemia 08/14/2020   GERD without esophagitis 06/10/2020   Atrial fibrillation with rapid ventricular response ([place]) 03/24/2020   Acute on chronic right heart failure ([place]) 01/27/2020   Acute respiratory failure with hypoxia ([place])    Fatty liver disease, nonalcoholic    Shock ([place])    Personal history of DVT (deep vein thrombosis) 12/26/2019   Chronic anticoagulation 12/26/2019   Palpitations 12/26/2019   AF (paroxysmal atrial fibrillation) ([place]) 12/23/2019   Heart failure, systolic, acute ([place]) 76/81/1572   Acute on chronic congestive heart failure (HCC)    Chronic diastolic HF (heart failure) ([place]) 08/23/2019   Chest pain 08/23/2019   Lactic acidosis 08/23/2019   History of pulmonary embolism 08/12/2019   Atrial tachycardia ([place]) 08/12/2019   Occasional tremors 08/12/2019   OSA (obstructive sleep apnea) 08/12/2019   Acute on chronic right-sided heart failure ([place]) 08/06/2019   Pulmonary embolism ([place]) 06/08/2019   Normocytic anemia 06/08/2019   Pulmonary emboli ([place]) 06/08/2019   Chronic heart failure with preserved ejection fraction ([place]) 05/30/2019   Class 2 severe obesity due to excess calories with serious comorbidity and body mass index (BMI) of 39.0 to  39.9 in adult ([place]) 05/30/2019   Morbid obesity with BMI of 40.0-44.9, adult ([place]) 05/30/2019   Hyponatremia    Drug induced constipation    Peripheral edema    Hypotension due to drugs    Hypoalbuminemia due to protein-calorie malnutrition (HCC)    Hypokalemia    Thrombocytopenia (HCC)    Anemia of chronic disease    Cirrhosis of liver without ascites (HCC)    Chronic pain syndrome    Debility 03/13/2019   Portal hypertensive gastropathy ([place]) 03/06/2019   Dyspnea    GIB (gastrointestinal bleeding) 03/04/2019   Mixed hyperlipidemia 01/24/2019   Insomnia 01/24/2019   Hyperlipidemia 01/24/2019   Essential hypertension 01/24/2019   Lumbar disc herniation 01/24/2019    Current Outpatient Medications:    acetaminophen (TYLENOL) 500 MG tablet, Take 1,000 mg by mouth every 6 (six) hours as needed for moderate pain or headache., Disp: , Rfl:    apixaban (ELIQUIS) 5 MG TABS tablet, Take 1 tablet (5 mg total) by mouth 2 (two) times daily., Disp: 60 tablet, Rfl: 0   Buprenorphine HCl-Naloxone HCl 8-2 MG FILM, Place 1 tablet under the tongue 2 (two) times daily., Disp: , Rfl:    eplerenone (INSPRA) 50 MG tablet, Take 2 tablets (100 mg total) by mouth daily., Disp: 60 tablet, Rfl: 3   gabapentin (NEURONTIN) 800 MG tablet, Take 1 tablet (800 mg total) by mouth 3 (three) times daily., Disp: 90 tablet, Rfl: 1   lactulose (CHRONULAC) 10 GM/15ML solution, Take 15 mLs (10 g total) by mouth 3 (three) times daily., Disp: 946 mL, Rfl: 1   metolazone (ZAROXOLYN) 2.5 MG tablet,  Take 1 tablet (2.5 mg total) by mouth 3 (three) times a week. Monday, Wednesday and Fridays, Disp: 14 tablet, Rfl: 0   metoprolol succinate (TOPROL XL) 25 MG 24 hr tablet, Take 1 tablet (25 mg total) by mouth 2 (two) times daily., Disp: 60 tablet, Rfl: 2   ondansetron (ZOFRAN-ODT) 4 MG disintegrating tablet, TAKE 1 TABLET BY MOUTH EVERY 8 HOURS AS NEEDED FOR NAUSEA OR VOMITING, Disp: 20 tablet, Rfl: 0   pantoprazole (PROTONIX) 40 MG  tablet, Take 40 mg by mouth 2 (two) times daily., Disp: , Rfl:    polyethylene glycol powder (GLYCOLAX/MIRALAX) 17 GM/SCOOP powder, Take 17 g by mouth daily. (Patient taking differently: Take 17 g by mouth daily as needed for mild constipation or moderate constipation.), Disp: 255 g, Rfl: 0   potassium chloride SA (KLOR-CON) 20 MEQ tablet, Take 4 tablets (80 mEq total) by mouth 2 (two) times daily. Extra 40 mEq with metolazone days., Disp: 260 tablet, Rfl: 0   pravastatin (PRAVACHOL) 20 MG tablet, Take 1 tablet (20 mg total) by mouth daily., Disp: 30 tablet, Rfl: 1   sertraline (ZOLOFT) 25 MG tablet, TAKE 1 TABLET BY MOUTH DAILY, Disp: 30 tablet, Rfl: 0   sodium chloride (OCEAN) 0.65 % SOLN nasal spray, Place 1 spray into both nostrils as needed for congestion., Disp: , Rfl:    tamsulosin (FLOMAX) 0.4 MG CAPS capsule, Take 1 capsule (0.4 mg total) by mouth daily., Disp: 30 capsule, Rfl: 0   torsemide (DEMADEX) 100 MG tablet, Take 1 tablet (100 mg total) by mouth 2 (two) times daily., Disp: 60 tablet, Rfl: 3   VENTOLIN HFA 108 (90 Base) MCG/ACT inhaler, Inhale 1 puff into the lungs every 4 (four) hours as needed for shortness of breath., Disp: 6.7 g, Rfl: 1   zolpidem (AMBIEN) 10 MG tablet, Take 1 tablet (10 mg total) by mouth at bedtime as needed for sleep., Disp: 30 tablet, Rfl: 0 Allergies  Allergen Reactions   Penicillins     Broke out in convulsions as a child  Did it involve swelling of the face/tongue/throat, SOB, or low BP? N/A Did it involve sudden or severe rash/hives, skin peeling, or any reaction on the inside of your mouth or nose? N/A Did you need to seek medical attention at a hospital or doctor's office?N/A When did it last happen? N/A If all above answers are "NO", may proceed with cephalosporin use.     Social History   Socioeconomic History   Marital status: Single    Spouse name: Not on file   Number of children: Not on file   Years of education: Not on file   Highest  education level: Not on file  Occupational History   Not on file  Tobacco Use   Smoking status: Never   Smokeless tobacco: Never  Vaping Use   Vaping Use: Never used  Substance and Sexual Activity   Alcohol use: Not Currently   Drug use: Not Currently    Types: Marijuana    Comment: Smoked for 30 years   Sexual activity: Not on file  Other Topics Concern   Not on file  Social History Narrative   Not on file   Social Determinants of Health   Financial Resource Strain: Medium Risk   Difficulty of Paying Living Expenses: Somewhat hard  Food Insecurity: No Food Insecurity   Worried About Running Out of Food in the Last Year: Never true   Ran Out of Food in the Last Year: Never  true  Transportation Needs: Unmet Transportation Needs   Lack of Transportation (Medical): Yes   Lack of Transportation (Non-Medical): No  Physical Activity: Not on file  Stress: Not on file  Social Connections: Not on file  Intimate Partner Violence: Not on file    Physical Exam Vitals reviewed.  Constitutional:      Appearance: Normal appearance. He is normal weight.  HENT:     Head: Normocephalic.     Nose: Nose normal.     Mouth/Throat:     Mouth: Mucous membranes are moist.     Pharynx: Oropharynx is clear.  Eyes:     Conjunctiva/sclera: Conjunctivae normal.     Pupils: Pupils are equal, round, and reactive to light.  Cardiovascular:     Rate and Rhythm: Normal rate and regular rhythm.     Pulses: Normal pulses.     Heart sounds: Normal heart sounds.  Pulmonary:     Effort: Pulmonary effort is normal.     Breath sounds: Normal breath sounds.  Abdominal:     General: Abdomen is flat.     Palpations: Abdomen is soft.     Comments: Constipation, no BM in one week.   Musculoskeletal:        General: Swelling present. Normal range of motion.     Cervical back: Normal range of motion.     Right lower leg: Edema present.     Left lower leg: Edema present.  Skin:    General: Skin is  warm and dry.     Capillary Refill: Capillary refill takes less than 2 seconds.  Neurological:     General: No focal deficit present.     Mental Status: He is alert. Mental status is at baseline.  Psychiatric:        Mood and Affect: Mood normal.   Arrived for home visit for Pax who was alert and oriented reporting to be feeling bad today complaining of abdominal pain. Luka reports he has not had a bowel movement in one week. He states he has not been taking the Miralax as instructed but reports he will start today. I encouraged him to take the Miralax and to eat something that will assist in moving his bowels. He agreed with plan. Vitals and assessment as noted in report. Danford had some weight gain over the last week but missed one dose of his metolazone on Friday morning. He reports his weight increasing since then it dropped from 375lbs to 373lbs today over night.  Legs were swollen and red per his usual presentation.   Cardio mems reading submitted.  He has been compliant with sending these and [name] Ninfa Meeker reports his readings are good.   Medications verified and reviewed. Pill box filled accordingly.   Reyli reports dizziness, orthostatic vitals completed with no changes.   BP sitting- 122/70 BP standing- 120/78   Appointments reviewed. Home visit complete I will see Saagar in one week.   Refills: NONE   Future Appointments  Date Time Provider [place]  01/10/2021 10:00 AM [facility] PA/NP [facility] None     ACTION: Home visit completed

## 2021-01-07 ENCOUNTER — Other Ambulatory Visit (HOSPITAL_COMMUNITY): Payer: Self-pay

## 2021-01-07 ENCOUNTER — Telehealth (HOSPITAL_COMMUNITY): Payer: Self-pay | Admitting: Adult Health

## 2021-01-07 ENCOUNTER — Other Ambulatory Visit (HOSPITAL_COMMUNITY): Payer: Self-pay | Admitting: Cardiology

## 2021-01-07 NOTE — Progress Notes (Signed)
Paramedicine Encounter    Patient ID: Charles Gray, male    DOB: April 08, 1956, 65 y.o.   MRN: [id number]   Patient Care Team: Enid Skeens., MD as PCP - General (Family Medicine) Berniece Salines, DO as PCP - Cardiology (Cardiology) Jorge Ny, LCSW as Social Worker (Licensed Clinical Social Worker)  Patient Active Problem List   Diagnosis Date Noted   Acute encephalopathy 12/21/2020   COPD (chronic obstructive pulmonary disease) ([place]) 12/21/2020   Chronic diastolic (congestive) heart failure ([place]) 11/14/2020   Syncope 10/28/2020   CHF (congestive heart failure) ([place]) 10/28/2020   Chronic venous stasis dermatitis of both lower extremities 08/27/2020   Cellulitis of both lower extremities 08/14/2020   Microcytic anemia 08/14/2020   GERD without esophagitis 06/10/2020   Atrial fibrillation with rapid ventricular response ([place]) 03/24/2020   Acute on chronic right heart failure ([place]) 01/27/2020   Acute respiratory failure with hypoxia ([place])    Fatty liver disease, nonalcoholic    Shock ([place])    Personal history of DVT (deep vein thrombosis) 12/26/2019   Chronic anticoagulation 12/26/2019   Palpitations 12/26/2019   AF (paroxysmal atrial fibrillation) ([place]) 12/23/2019   Heart failure, systolic, acute ([place]) 93/90/3009   Acute on chronic congestive heart failure (HCC)    Chronic diastolic HF (heart failure) ([place]) 08/23/2019   Chest pain 08/23/2019   Lactic acidosis 08/23/2019   History of pulmonary embolism 08/12/2019   Atrial tachycardia ([place]) 08/12/2019   Occasional tremors 08/12/2019   OSA (obstructive sleep apnea) 08/12/2019   Acute on chronic right-sided heart failure ([place]) 08/06/2019   Pulmonary embolism ([place]) 06/08/2019   Normocytic anemia 06/08/2019   Pulmonary emboli ([place]) 06/08/2019   Chronic heart failure with preserved ejection fraction ([place]) 05/30/2019   Class 2 severe obesity due to excess calories with serious comorbidity and body mass index (BMI) of 39.0 to  39.9 in adult ([place]) 05/30/2019   Morbid obesity with BMI of 40.0-44.9, adult ([place]) 05/30/2019   Hyponatremia    Drug induced constipation    Peripheral edema    Hypotension due to drugs    Hypoalbuminemia due to protein-calorie malnutrition (HCC)    Hypokalemia    Thrombocytopenia (HCC)    Anemia of chronic disease    Cirrhosis of liver without ascites (HCC)    Chronic pain syndrome    Debility 03/13/2019   Portal hypertensive gastropathy ([place]) 03/06/2019   Dyspnea    GIB (gastrointestinal bleeding) 03/04/2019   Mixed hyperlipidemia 01/24/2019   Insomnia 01/24/2019   Hyperlipidemia 01/24/2019   Essential hypertension 01/24/2019   Lumbar disc herniation 01/24/2019    Current Outpatient Medications:    acetaminophen (TYLENOL) 500 MG tablet, Take 1,000 mg by mouth every 6 (six) hours as needed for moderate pain or headache., Disp: , Rfl:    apixaban (ELIQUIS) 5 MG TABS tablet, Take 1 tablet (5 mg total) by mouth 2 (two) times daily., Disp: 60 tablet, Rfl: 0   Buprenorphine HCl-Naloxone HCl 8-2 MG FILM, Place 1 tablet under the tongue 2 (two) times daily., Disp: , Rfl:    eplerenone (INSPRA) 50 MG tablet, Take 2 tablets (100 mg total) by mouth daily., Disp: 60 tablet, Rfl: 3   gabapentin (NEURONTIN) 800 MG tablet, Take 1 tablet (800 mg total) by mouth 3 (three) times daily., Disp: 90 tablet, Rfl: 1   lactulose (CHRONULAC) 10 GM/15ML solution, Take 15 mLs (10 g total) by mouth 3 (three) times daily., Disp: 946 mL, Rfl: 1   metolazone (ZAROXOLYN) 2.5 MG tablet,  Take 1 tablet (2.5 mg total) by mouth 3 (three) times a week. Monday, Wednesday and Fridays, Disp: 14 tablet, Rfl: 0   metoprolol succinate (TOPROL XL) 25 MG 24 hr tablet, Take 1 tablet (25 mg total) by mouth 2 (two) times daily., Disp: 60 tablet, Rfl: 2   ondansetron (ZOFRAN-ODT) 4 MG disintegrating tablet, TAKE 1 TABLET BY MOUTH EVERY 8 HOURS AS NEEDED FOR NAUSEA OR VOMITING, Disp: 20 tablet, Rfl: 0   pantoprazole (PROTONIX) 40 MG  tablet, Take 40 mg by mouth 2 (two) times daily., Disp: , Rfl:    polyethylene glycol powder (GLYCOLAX/MIRALAX) 17 GM/SCOOP powder, Take 17 g by mouth daily. (Patient taking differently: Take 17 g by mouth daily as needed for mild constipation or moderate constipation.), Disp: 255 g, Rfl: 0   potassium chloride SA (KLOR-CON) 20 MEQ tablet, Take 4 tablets (80 mEq total) by mouth 2 (two) times daily. Extra 40 mEq with metolazone days., Disp: 260 tablet, Rfl: 0   pravastatin (PRAVACHOL) 20 MG tablet, Take 1 tablet (20 mg total) by mouth daily., Disp: 30 tablet, Rfl: 1   sertraline (ZOLOFT) 25 MG tablet, TAKE 1 TABLET BY MOUTH DAILY, Disp: 30 tablet, Rfl: 0   sodium chloride (OCEAN) 0.65 % SOLN nasal spray, Place 1 spray into both nostrils as needed for congestion., Disp: , Rfl:    tamsulosin (FLOMAX) 0.4 MG CAPS capsule, Take 1 capsule (0.4 mg total) by mouth daily., Disp: 30 capsule, Rfl: 0   torsemide (DEMADEX) 100 MG tablet, Take 1 tablet (100 mg total) by mouth 2 (two) times daily., Disp: 60 tablet, Rfl: 3   VENTOLIN HFA 108 (90 Base) MCG/ACT inhaler, Inhale 1 puff into the lungs every 4 (four) hours as needed for shortness of breath., Disp: 6.7 g, Rfl: 1   zolpidem (AMBIEN) 10 MG tablet, Take 1 tablet (10 mg total) by mouth at bedtime as needed for sleep., Disp: 30 tablet, Rfl: 0 Allergies  Allergen Reactions   Penicillins     Broke out in convulsions as a child  Did it involve swelling of the face/tongue/throat, SOB, or low BP? N/A Did it involve sudden or severe rash/hives, skin peeling, or any reaction on the inside of your mouth or nose? N/A Did you need to seek medical attention at a hospital or doctor's office?N/A When did it last happen? N/A If all above answers are "NO", may proceed with cephalosporin use.     Social History   Socioeconomic History   Marital status: Single    Spouse name: Not on file   Number of children: Not on file   Years of education: Not on file   Highest  education level: Not on file  Occupational History   Not on file  Tobacco Use   Smoking status: Never   Smokeless tobacco: Never  Vaping Use   Vaping Use: Never used  Substance and Sexual Activity   Alcohol use: Not Currently   Drug use: Not Currently    Types: Marijuana    Comment: Smoked for 30 years   Sexual activity: Not on file  Other Topics Concern   Not on file  Social History Narrative   Not on file   Social Determinants of Health   Financial Resource Strain: Medium Risk   Difficulty of Paying Living Expenses: Somewhat hard  Food Insecurity: No Food Insecurity   Worried About Running Out of Food in the Last Year: Never true   Ran Out of Food in the Last Year: Never  true  Transportation Needs: Unmet Transportation Needs   Lack of Transportation (Medical): Yes   Lack of Transportation (Non-Medical): No  Physical Activity: Not on file  Stress: Not on file  Social Connections: Not on file  Intimate Partner Violence: Not on file    Physical Exam Vitals reviewed.  Constitutional:      Appearance: He is obese.  HENT:     Head: Normocephalic.     Nose: Nose normal.     Mouth/Throat:     Mouth: Mucous membranes are moist.     Pharynx: Oropharynx is clear.  Eyes:     Conjunctiva/sclera: Conjunctivae normal.     Pupils: Pupils are equal, round, and reactive to light.  Cardiovascular:     Rate and Rhythm: Normal rate and regular rhythm.     Pulses: Normal pulses.     Heart sounds: Normal heart sounds.  Pulmonary:     Effort: Respiratory distress present.     Breath sounds: Normal breath sounds.     Comments: Short of breath upon movement.  Abdominal:     General: Abdomen is flat.  Musculoskeletal:        General: Swelling present.     Cervical back: Normal range of motion.     Right lower leg: Edema present.     Left lower leg: Edema present.     Comments: Weak upon walking, complains of lower leg, feet and right shoulder pain.   Skin:    General: Skin is  warm and dry.     Capillary Refill: Capillary refill takes less than 2 seconds.  Neurological:     General: No focal deficit present.     Mental Status: He is alert. Mental status is at baseline.  Psychiatric:        Mood and Affect: Mood normal.    Arrived for home visit for Kalob who reports feeling bad today stating his weight is up and he is short of breath feeling very fatigued. Antoin missed one dose of his Metolazone on Friday and also several bedtime doses over the last week. Julio reports he just simply forgot to take them. I encouraged him and tried to remind him the importance of taking his medications daily and at the appropriate times. He agreed with same.   Yohannes reports drinking extra water, eating salty snacks and italian ice often. I re-educated Winton on the importance of sticking with a heart healthy diet due to his weak heart and risk for fluid overload. He verbalized understanding.   I obtained assessment and vitals as noted in report. Tylyn had some pedal edema slightly more than he normally does. Vontrell reports right shoulder pain, pain in his legs and feet and feeling fatigued.  Adin submitted his Cardio-MEMS reading today and noted to be 10 as yesterday and days prior were 7. I contacted [name] Clegg PA who advised Niko to take one extra Torsemide today and to be sure to take his medications as prescribed and to make sure he is watching his intake. He will be followed up in the clinic on Friday.   I set up [facility] transportation for Payam, they will be calling him with a pick up time on Thursday.   Medications confirmed and pill box filled accordingly.   Hillman follows up with PCP today at 330.   Home visit complete, I will see Deion in one week.   Refills: -Gabapentin -Potassium -Zofran    Future Appointments  Date Time Provider [place]  01/10/2021 10:00 AM [facility] PA/NP [facility] None     ACTION: Home visit completed

## 2021-01-07 NOTE — Telephone Encounter (Signed)
Called by HF Paramedicine   He missed dose metolazone on Friday   Cardiomems Goal : 15  Todays reading was 10.   Suspect he is getting to much fluid. Instructed to take an extra 20 mg torsemide today.   Will lower cadiomems range 10 ( Range 7-13)  He has follow up later this week. Check BMET   Charles Hale NP-C  9:32 AM

## 2021-01-10 ENCOUNTER — Other Ambulatory Visit: Payer: Self-pay

## 2021-01-10 ENCOUNTER — Encounter (HOSPITAL_COMMUNITY): Payer: Self-pay

## 2021-01-10 ENCOUNTER — Ambulatory Visit (HOSPITAL_COMMUNITY)
Admission: RE | Admit: 2021-01-10 | Discharge: 2021-01-10 | Disposition: A | Discharge status: Home / Self Care | Payer: Medicare HMO | Source: Ambulatory Visit | Attending: Cardiology | Admitting: Cardiology

## 2021-01-10 VITALS — BP 140/78 | HR 79 | Wt 382.6 lb

## 2021-01-10 DIAGNOSIS — F419 Anxiety disorder, unspecified: Secondary | ICD-10-CM | POA: Insufficient documentation

## 2021-01-10 DIAGNOSIS — G4733 Obstructive sleep apnea (adult) (pediatric): Secondary | ICD-10-CM | POA: Insufficient documentation

## 2021-01-10 DIAGNOSIS — Z79899 Other long term (current) drug therapy: Secondary | ICD-10-CM | POA: Diagnosis not present

## 2021-01-10 DIAGNOSIS — I5032 Chronic diastolic (congestive) heart failure: Secondary | ICD-10-CM | POA: Diagnosis present

## 2021-01-10 DIAGNOSIS — I10 Essential (primary) hypertension: Secondary | ICD-10-CM

## 2021-01-10 DIAGNOSIS — Z86711 Personal history of pulmonary embolism: Secondary | ICD-10-CM | POA: Insufficient documentation

## 2021-01-10 DIAGNOSIS — I480 Paroxysmal atrial fibrillation: Secondary | ICD-10-CM | POA: Insufficient documentation

## 2021-01-10 DIAGNOSIS — J449 Chronic obstructive pulmonary disease, unspecified: Secondary | ICD-10-CM | POA: Insufficient documentation

## 2021-01-10 DIAGNOSIS — Z7901 Long term (current) use of anticoagulants: Secondary | ICD-10-CM | POA: Insufficient documentation

## 2021-01-10 DIAGNOSIS — F32A Depression, unspecified: Secondary | ICD-10-CM | POA: Insufficient documentation

## 2021-01-10 DIAGNOSIS — I110 Hypertensive heart disease with heart failure: Secondary | ICD-10-CM | POA: Diagnosis not present

## 2021-01-10 DIAGNOSIS — Z596 Low income: Secondary | ICD-10-CM | POA: Diagnosis not present

## 2021-01-10 DIAGNOSIS — I872 Venous insufficiency (chronic) (peripheral): Secondary | ICD-10-CM

## 2021-01-10 DIAGNOSIS — Z86718 Personal history of other venous thrombosis and embolism: Secondary | ICD-10-CM | POA: Insufficient documentation

## 2021-01-10 DIAGNOSIS — Z8249 Family history of ischemic heart disease and other diseases of the circulatory system: Secondary | ICD-10-CM | POA: Diagnosis not present

## 2021-01-10 DIAGNOSIS — Z880 Allergy status to penicillin: Secondary | ICD-10-CM | POA: Insufficient documentation

## 2021-01-10 DIAGNOSIS — I2510 Atherosclerotic heart disease of native coronary artery without angina pectoris: Secondary | ICD-10-CM | POA: Insufficient documentation

## 2021-01-10 DIAGNOSIS — L03119 Cellulitis of unspecified part of limb: Secondary | ICD-10-CM | POA: Insufficient documentation

## 2021-01-10 LAB — BASIC METABOLIC PANEL
Anion gap: 9 (ref 5–15)
BUN: 14 mg/dL (ref 8–23)
CO2: 29 mmol/L (ref 22–32)
Calcium: 9 mg/dL (ref 8.9–10.3)
Chloride: 98 mmol/L (ref 98–111)
Creatinine, Ser: 0.86 mg/dL (ref 0.61–1.24)
GFR, Estimated: >60 mL/min (ref >60)
Glucose, Bld: 125 mg/dL — ABNORMAL HIGH (ref 70–99)
Potassium: 3.8 mmol/L (ref 3.5–5.1)
Sodium: 136 mmol/L (ref 135–145)

## 2021-01-10 MED ORDER — DOXYCYCLINE HYCLATE 100 MG PO CAPS: 100.0000 mg | ORAL_CAPSULE | Freq: Two times a day (BID) | ORAL | 0 refills | Status: AC

## 2021-01-10 MED ORDER — DOXYCYCLINE HYCLATE 100 MG PO CAPS: 100.0000 mg | ORAL_CAPSULE | Freq: Two times a day (BID) | ORAL | 0 refills | Status: DC

## 2021-01-10 NOTE — Patient Instructions (Signed)
Labs done today. We will contact you only if your labs are abnormal.  START Doxycycline 123m (1 tablet) by mouth 2 times daily for 5 days.  No other medication changes were made. Please continue all current medications as prescribed.  Home health will be in contact with you regarding your [place] boots.   Your physician recommends that you schedule a follow-up appointment in: 2-3 weeks with our [facility] and in 4 months with Dr.McLean . Please contact our office in December to schedule a January 2023 appointment.   If you have any questions or concerns before your next appointment please send uKoreaa message through mMackor call our office at [phone]    TO LEAVE A MESSAGE FOR THE NURSE SELECT OPTION 2, PLEASE LEAVE A MESSAGE INCLUDING: YOUR NAME DATE OF BIRTH CALL BACK NUMBER REASON FOR CALL**this is important as we prioritize the call backs  YOU WILL RECEIVE A CALL BACK THE SAME DAY AS LONG AS YOU CALL BEFORE 4:00 PM   Do the following things EVERYDAY: Weigh yourself in the morning before breakfast. Write it down and keep it in a log. Take your medicines as prescribed Eat low salt foods--Limit salt (sodium) to 2000 mg per day.  Stay as active as you can everyday Limit all fluids for the day to less than 2 liters   At [facility] you and your health needs are our priority. As part of our continuing mission to provide you with exceptional heart care, we have created designated Provider Care Teams. These Care Teams include your primary Cardiologist (physician) and Advanced Practice Providers (APPs- Physician Assistants and Nurse Practitioners) who all work together to provide you with the care you need, when you need it.   You may see any of the following providers on your designated Care Team at your next follow up: Dr DGlori BickersDr DHaynes Kerns NP BLyda Jester PUtahLAudry Riles PharmD   Please be sure to bring in all your  medications bottles to every appointment.

## 2021-01-10 NOTE — Progress Notes (Signed)
ADVANCED HF CLINIC NOTE  Primary Care: Enid Skeens., MD Primary Cardiologist: Dr. Berniece Salines, DO  HF Cardiology: Dr. [name]  HPI: 65 y.o. 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    He has had multiple hospitalizations in 2021 for CHF exacerbation, requiring IV Lasix and change in PO regimen from lasix to torsemide. Echo 4/21 showed normal LVEF 55-60% w/ G1DD. RV not well visualized, but felt to have component of RV failure. [place] 4/21 showed normal PA pressure and normal cardiac output. PFTs also completed and c/w mild obstructive airway disease.   In 7/21, he had a coronary CTA.  This showed no coronary calcium but there was a concern for soft plaque in the distal LCx and distal RCA with hemodynamically significant stenosis.  He was managed medically. He has not had any chest pain, but was started on ranolazine after his CTA.  He feels like it makes his "head fuzzy."    Admitted with A/C diastolic heart failure. Diuresed with IV lasix and transitioned to bumex 3 mg twice a day. Discharge weight 360 pounds. Discharged 02/09/2020.   He was admitted again in 12/21 with CHF.  Echo in 12/21 showed EF 84-53%, grade 2 diastolic dysfunction, RV poorly visualized.   OV on 12/21 still having significant exertional dyspnea, short of breath just walking around the house.  + Orthopnea, has to sleep on the sofa.  Sleeps poorly, cannot tolerate CPAP.  Cardiomems was discussed but patient reluctant, Jardiance added, metolazone increased to 2 x/week, metoprolol stopped, and Toprol XL 25 mg daily started.  Admitted 6/46/80 with A/C diastolic heart failure. Diuresed with IV lasix + metolazone. Transitioned to torsemide 60 mg twice a day and metolazone twice a day. D/C weight 364 pounds.   Admitted 5/28//22 with cellulitis. Treated with antibiotics.   Returned 09/10/20 for HF follow up. Had palpitations  and EMS checked his EKG. EKG showed SR with rate in the 70s. Having ongoing leg swelling. SOB with exertion.  Weight at home 373-381 pounds. Zio patch placed.  Admitted 09/20/20 admitted with SOB and leg swelling secondary to A/CHF. He was diuresed with IV lasix 5 L, weight 371 at discharge.  He was admitted for A/C CHF 7/22 after presenting with LE swelling and complaints of dizziness with fall.  He had failed outpatient IV diuresis. He was diuresed with IV lasix + metolazone. He underwent RHC showing optimized filling pressures and preserved CO/CI. Discussed Cardiomems inpatient , and he agreed to work up. Insurance approval pending. He was discharged on torsemide 100 mg bid + metolazone M/W/F. Discharge weight 374 lbs.  RHC (8/22) showed normal filling pressures & he underwent Cardiomems placement.  Admitted 9/10-9/12/22 for acute encephalopathy, felt secondary to polypharmacy. Ambien and gabapentin were briefly held. Ammonia mildly elevated and he was started on lactulose. Mentation returned to baseline and he was discharged home with Home Health PT/OT.  Today he returns for HF follow up. Legs are more swollen and he now has blisters on lower legs. Has not been wearing compression hose. Feeling poorly today, getting around the house with his walker. Denies CP, dizziness, chronically sleeps in a recliner. Appetite ok. No fever or chills. Weight at home 371 pounds. Missed a dose of metolazone last week and instructed to take extra 20 mg of torsemide last week. Has no  ECG (personally reviewed): none ordered today.  Cardiomems: PAD 6 today, (goal 10).  Labs (6/21):  K 4.3, creatinine 1.03 Labs (10/21): LDL 73 Labs (12/21): K 3.8, creatinine 0.98 Labs (3/22): K 3.1 Creatinine 0.9  Labs (5/22): K 3.7 Creatinine 0.94  Labs (6/22): K 3.3, creatinine 1.13 Labs (7/22): K 3.5, creatinine 0.99 Labs (8/22): K 3.7, creatinine 1.09, hgb 10.7  PMH: 1. HTN 2. OSA 3. Hyperlipidemia 4. Low back pain 5.  PE/DVT in 2/21.  - V/Q scan in 6/21 was not suggestive of chronic PE.  6. Nonalcoholic steatohepatitis.  7. GI bleed in 10/20.  8. Anxiety 9. CAD: Coronary CTA in 7/21 with calcium score 0 but concern for soft plaque distal CFX and RCA.  By [facility] 2, there was flow-limiting stenosis in distal CFX and distal RCA.  10. Chronic diastolic CHF: Echo in 5/63 with EF 55-60%, RV poorly visualized.  - RHC (4/21): mean RA 8, PA 20/8, mean PCWP 11, CI 2.8 - Echo (12/21):EF 14-97%, grade 2 diastolic dysfunction, RV poorly visualized.   - RHC (8/22) normal filling pressures, RA mean 7, RV 29/9, PA 32/15, mean 22, PCWP mean 13, CO/CI 7.32/2.41. - Cardiomems (8/22) placement. 11. PFTs (4/21): Mild obstruction.  12. ABIs (10/21): Normal.  Review of Systems: All systems reviewed and negative except as per HPI.   Current Outpatient Medications  Medication Sig Dispense Refill   acetaminophen (TYLENOL) 500 MG tablet Take 1,000 mg by mouth every 6 (six) hours as needed for moderate pain or headache.     apixaban (ELIQUIS) 5 MG TABS tablet Take 1 tablet (5 mg total) by mouth 2 (two) times daily. 60 tablet 0   Buprenorphine HCl-Naloxone HCl 8-2 MG FILM Place 1 tablet under the tongue 2 (two) times daily.     eplerenone (INSPRA) 50 MG tablet Take 2 tablets (100 mg total) by mouth daily. 60 tablet 3   gabapentin (NEURONTIN) 800 MG tablet Take 1 tablet (800 mg total) by mouth 3 (three) times daily. 90 tablet 1   lactulose (CHRONULAC) 10 GM/15ML solution Take 15 mLs (10 g total) by mouth 3 (three) times daily. 946 mL 1   metolazone (ZAROXOLYN) 2.5 MG tablet Take 1 tablet (2.5 mg total) by mouth 3 (three) times a week. Monday, Wednesday and Fridays 14 tablet 0   metoprolol succinate (TOPROL XL) 25 MG 24 hr tablet Take 1 tablet (25 mg total) by mouth 2 (two) times daily. 60 tablet 2   ondansetron (ZOFRAN-ODT) 4 MG disintegrating tablet TAKE 1 TABLET BY MOUTH EVERY 8 HOURS AS NEEDED FOR NAUSEA OR VOMITING 20 tablet 2    pantoprazole (PROTONIX) 40 MG tablet Take 40 mg by mouth 2 (two) times daily.     polyethylene glycol powder (GLYCOLAX/MIRALAX) 17 GM/SCOOP powder Take 17 g by mouth daily. (Patient taking differently: Take 17 g by mouth daily as needed for mild constipation or moderate constipation.) 255 g 0   potassium chloride SA (KLOR-CON) 20 MEQ tablet Take 4 tablets (80 mEq total) by mouth 2 (two) times daily. Extra 40 mEq with metolazone days. 260 tablet 0   pravastatin (PRAVACHOL) 20 MG tablet Take 1 tablet (20 mg total) by mouth daily. 30 tablet 1   sertraline (ZOLOFT) 25 MG tablet TAKE 1 TABLET BY MOUTH DAILY 30 tablet 0   sodium chloride (OCEAN) 0.65 % SOLN nasal spray Place 1 spray into both nostrils as needed for congestion.     tamsulosin (FLOMAX) 0.4 MG CAPS capsule Take 1 capsule (0.4 mg total) by mouth daily. 30 capsule 0   torsemide (DEMADEX) 100 MG tablet Take 1 tablet (  100 mg total) by mouth 2 (two) times daily. 60 tablet 3   VENTOLIN HFA 108 (90 Base) MCG/ACT inhaler Inhale 1 puff into the lungs every 4 (four) hours as needed for shortness of breath. 6.7 g 1   zolpidem (AMBIEN) 10 MG tablet Take 1 tablet (10 mg total) by mouth at bedtime as needed for sleep. 30 tablet 0   No current facility-administered medications for this encounter.   Allergies  Allergen Reactions   Penicillins     Broke out in convulsions as a child  Did it involve swelling of the face/tongue/throat, SOB, or low BP? N/A Did it involve sudden or severe rash/hives, skin peeling, or any reaction on the inside of your mouth or nose? N/A Did you need to seek medical attention at a hospital or doctor's office?N/A When did it last happen? N/A If all above answers are "NO", may proceed with cephalosporin use.   Social History   Socioeconomic History   Marital status: Single    Spouse name: Not on file   Number of children: Not on file   Years of education: Not on file   Highest education level: Not on file   Occupational History   Not on file  Tobacco Use   Smoking status: Never   Smokeless tobacco: Never  Vaping Use   Vaping Use: Never used  Substance and Sexual Activity   Alcohol use: Not Currently   Drug use: Not Currently    Types: Marijuana    Comment: Smoked for 30 years   Sexual activity: Not on file  Other Topics Concern   Not on file  Social History Narrative   Not on file   Social Determinants of Health   Financial Resource Strain: Medium Risk   Difficulty of Paying Living Expenses: Somewhat hard  Food Insecurity: No Food Insecurity   Worried About Running Out of Food in the Last Year: Never true   Ran Out of Food in the Last Year: Never true  Transportation Needs: Unmet Transportation Needs   Lack of Transportation (Medical): Yes   Lack of Transportation (Non-Medical): No  Physical Activity: Not on file  Stress: Not on file  Social Connections: Not on file  Intimate Partner Violence: Not on file    Family History  Problem Relation Age of Onset   Hyperlipidemia Mother    Hypertension Mother    Stroke Mother    CAD Maternal Grandmother    CAD Maternal Grandfather    BP 140/78   Pulse 79   Wt (!) 173.5 kg (382 lb 9.6 oz)   SpO2 97%   BMI 40.01 kg/m   Wt Readings from Last 3 Encounters:  01/10/21 (!) 173.5 kg (382 lb 9.6 oz)  01/07/21 (!) 170.6 kg (376 lb)  12/31/20 (!) 169.2 kg (373 lb)   PHYSICAL EXAM: General:  NAD. No resp difficulty, arrived in [facility] HEENT: Normal Neck: Supple. Thick neck. Carotids 2+ bilat; no bruits. No lymphadenopathy or thryomegaly appreciated. Cor: PMI nondisplaced. Regular rate & rhythm. No rubs, gallops or murmurs. Lungs: Clear, diminished in bases Abdomen: Obese, nontender, nondistended. No hepatosplenomegaly. No bruits or masses. Good bowel sounds. Extremities: No cyanosis, clubbing, rash, 2+ BLE with erythema, multiple fluid-filled blisters to inner ankles. Neuro: Alert & oriented x 3, cranial nerves grossly intact. Moves  all 4 extremities w/o difficulty. Tearful.  ASSESSMENT & PLAN: 1. Chronic diastolic CHF with suspected RV dysfunction: Echo 7/22 with EF 60-65%, mild LVH, normal RV, unable to estimate PA systolic  pressure. Multiple CHF admissions despite aggressive outpatient diuretic regimen and paramedicine.  RHC (7/22) with optimized filling pressures, PCWP 13, CO/CI 7.3/2.4. Received IV lasix 80 mg through paramedicine 11/12/20. RHC (8/22) with normal filling pressures; underwent placement of Cardiomems. NYHA III at baseline. He is mildly volume overloaded on exam (LE edema) however, I suspect much of this LE edema is due to venous stasis. Needs to be more compliant with compression stockings at home.  - Will have [place] [facility] Unna boots today. BMET today. - Continue torsemide 100 mg bid + metolazone 2.5 mg on M/W/F + extra 40 KCl on metolazone days.  - Continue eplerenone 100 mg daily.  - Off Jardiance (GI upset). 2. H/o PE/DVT: Suspect due to sedentary lifestyle.  Occurred in 2/21.  V/Q scan in 6/21 did not show evidence for chronic PE.   - Continue apixaban. No bleeding issues.  3. OSA: Cannot tolerate CPAP. 4. Anxiety/depression: This is a major issue.   - Continue sertraline 25 mg daily.  5. CAD: Cath 1/22 with no significant CAD. No CP. 6. Hypertension: Stable. - Continue Toprol XL 25 mg bid.   7. Atrial fibrillation: Paroxysmal. Regular on exam. - Continue apixaban. No bleeding issues. - Continue Toprol XL. 8. Lower extremity cellulitis: Admitted 5/22 with possible BLE cellulitis. Legs are more edematous today with erythema and new blisters. No fever. - Start doxy 100 mg bid x 5 days. - Unna boots as above.   Follow up in 2-3 weeks with APP to reassess volume and cellulitis.   Maricela Bo [facility] FNP-BC  01/10/2021

## 2021-01-14 ENCOUNTER — Telehealth (HOSPITAL_COMMUNITY): Payer: Self-pay | Admitting: *Deleted

## 2021-01-14 ENCOUNTER — Other Ambulatory Visit (HOSPITAL_COMMUNITY): Payer: Self-pay

## 2021-01-14 NOTE — Telephone Encounter (Signed)
Order for unna boots faxed to [place]  Fax # [phone]

## 2021-01-14 NOTE — Progress Notes (Signed)
Paramedicine Encounter    Patient ID: Tareek Sabo, male    DOB: [birth year], 65 y.o.   MRN: [id number]   Patient Care Team: Enid Skeens., MD as PCP - General (Family Medicine) Berniece Salines, DO as PCP - Cardiology (Cardiology) Jorge Ny, LCSW as Social Worker (Licensed Clinical Social Worker)  Patient Active Problem List   Diagnosis Date Noted   Acute encephalopathy 12/21/2020   COPD (chronic obstructive pulmonary disease) ([place]) 12/21/2020   Chronic diastolic (congestive) heart failure ([place]) 11/14/2020   Syncope 10/28/2020   CHF (congestive heart failure) ([place]) 10/28/2020   Chronic venous stasis dermatitis of both lower extremities 08/27/2020   Cellulitis of both lower extremities 08/14/2020   Microcytic anemia 08/14/2020   GERD without esophagitis 06/10/2020   Atrial fibrillation with rapid ventricular response ([place]) 03/24/2020   Acute on chronic right heart failure ([place]) 01/27/2020   Acute respiratory failure with hypoxia ([place])    Fatty liver disease, nonalcoholic    Shock ([place])    Personal history of DVT (deep vein thrombosis) 12/26/2019   Chronic anticoagulation 12/26/2019   Palpitations 12/26/2019   AF (paroxysmal atrial fibrillation) ([place]) 12/23/2019   Heart failure, systolic, acute ([place]) 36/62/9476   Acute on chronic congestive heart failure (HCC)    Chronic diastolic HF (heart failure) ([place]) 08/23/2019   Chest pain 08/23/2019   Lactic acidosis 08/23/2019   History of pulmonary embolism 08/12/2019   Atrial tachycardia ([place]) 08/12/2019   Occasional tremors 08/12/2019   OSA (obstructive sleep apnea) 08/12/2019   Acute on chronic right-sided heart failure ([place]) 08/06/2019   Pulmonary embolism ([place]) 06/08/2019   Normocytic anemia 06/08/2019   Pulmonary emboli ([place]) 06/08/2019   Chronic heart failure with preserved ejection fraction ([place]) 05/30/2019   Class 2 severe obesity due to excess calories with serious comorbidity and body mass index (BMI) of 39.0 to  39.9 in adult ([place]) 05/30/2019   Morbid obesity with BMI of 40.0-44.9, adult ([place]) 05/30/2019   Hyponatremia    Drug induced constipation    Peripheral edema    Hypotension due to drugs    Hypoalbuminemia due to protein-calorie malnutrition (HCC)    Hypokalemia    Thrombocytopenia (HCC)    Anemia of chronic disease    Cirrhosis of liver without ascites (HCC)    Chronic pain syndrome    Debility 03/13/2019   Portal hypertensive gastropathy ([place]) 03/06/2019   Dyspnea    GIB (gastrointestinal bleeding) 03/04/2019   Mixed hyperlipidemia 01/24/2019   Insomnia 01/24/2019   Hyperlipidemia 01/24/2019   Essential hypertension 01/24/2019   Lumbar disc herniation 01/24/2019    Current Outpatient Medications:    acetaminophen (TYLENOL) 500 MG tablet, Take 1,000 mg by mouth every 6 (six) hours as needed for moderate pain or headache., Disp: , Rfl:    apixaban (ELIQUIS) 5 MG TABS tablet, Take 1 tablet (5 mg total) by mouth 2 (two) times daily., Disp: 60 tablet, Rfl: 0   Buprenorphine HCl-Naloxone HCl 8-2 MG FILM, Place 1 tablet under the tongue 2 (two) times daily., Disp: , Rfl:    doxycycline (VIBRAMYCIN) 100 MG capsule, Take 1 capsule (100 mg total) by mouth 2 (two) times daily for 5 days., Disp: 10 capsule, Rfl: 0   eplerenone (INSPRA) 50 MG tablet, Take 2 tablets (100 mg total) by mouth daily., Disp: 60 tablet, Rfl: 3   gabapentin (NEURONTIN) 800 MG tablet, Take 1 tablet (800 mg total) by mouth 3 (three) times daily., Disp: 90 tablet, Rfl: 1   lactulose ([place])  10 GM/15ML solution, Take 15 mLs (10 g total) by mouth 3 (three) times daily., Disp: 946 mL, Rfl: 1   metolazone (ZAROXOLYN) 2.5 MG tablet, Take 1 tablet (2.5 mg total) by mouth 3 (three) times a week. Monday, Wednesday and Fridays, Disp: 14 tablet, Rfl: 0   metoprolol succinate (TOPROL XL) 25 MG 24 hr tablet, Take 1 tablet (25 mg total) by mouth 2 (two) times daily., Disp: 60 tablet, Rfl: 2   ondansetron (ZOFRAN-ODT) 4 MG  disintegrating tablet, TAKE 1 TABLET BY MOUTH EVERY 8 HOURS AS NEEDED FOR NAUSEA OR VOMITING, Disp: 20 tablet, Rfl: 2   pantoprazole (PROTONIX) 40 MG tablet, Take 40 mg by mouth 2 (two) times daily., Disp: , Rfl:    polyethylene glycol powder (GLYCOLAX/MIRALAX) 17 GM/SCOOP powder, Take 17 g by mouth daily. (Patient taking differently: Take 17 g by mouth daily as needed for mild constipation or moderate constipation.), Disp: 255 g, Rfl: 0   potassium chloride SA (KLOR-CON) 20 MEQ tablet, Take 4 tablets (80 mEq total) by mouth 2 (two) times daily. Extra 40 mEq with metolazone days., Disp: 260 tablet, Rfl: 0   pravastatin (PRAVACHOL) 20 MG tablet, Take 1 tablet (20 mg total) by mouth daily., Disp: 30 tablet, Rfl: 1   sertraline (ZOLOFT) 25 MG tablet, TAKE 1 TABLET BY MOUTH DAILY, Disp: 30 tablet, Rfl: 0   sodium chloride (OCEAN) 0.65 % SOLN nasal spray, Place 1 spray into both nostrils as needed for congestion., Disp: , Rfl:    tamsulosin (FLOMAX) 0.4 MG CAPS capsule, Take 1 capsule (0.4 mg total) by mouth daily., Disp: 30 capsule, Rfl: 0   torsemide (DEMADEX) 100 MG tablet, Take 1 tablet (100 mg total) by mouth 2 (two) times daily., Disp: 60 tablet, Rfl: 3   VENTOLIN HFA 108 (90 Base) MCG/ACT inhaler, Inhale 1 puff into the lungs every 4 (four) hours as needed for shortness of breath., Disp: 6.7 g, Rfl: 1   zolpidem (AMBIEN) 10 MG tablet, Take 1 tablet (10 mg total) by mouth at bedtime as needed for sleep., Disp: 30 tablet, Rfl: 0 Allergies  Allergen Reactions   Penicillins     Broke out in convulsions as a child  Did it involve swelling of the face/tongue/throat, SOB, or low BP? N/A Did it involve sudden or severe rash/hives, skin peeling, or any reaction on the inside of your mouth or nose? N/A Did you need to seek medical attention at a hospital or doctor's office?N/A When did it last happen? N/A If all above answers are "NO", may proceed with cephalosporin use.     Social History    Socioeconomic History   Marital status: Single    Spouse name: Not on file   Number of children: Not on file   Years of education: Not on file   Highest education level: Not on file  Occupational History   Not on file  Tobacco Use   Smoking status: Never   Smokeless tobacco: Never  Vaping Use   Vaping Use: Never used  Substance and Sexual Activity   Alcohol use: Not Currently   Drug use: Not Currently    Types: Marijuana    Comment: Smoked for 30 years   Sexual activity: Not on file  Other Topics Concern   Not on file  Social History Narrative   Not on file   Social Determinants of Health   Financial Resource Strain: Medium Risk   Difficulty of Paying Living Expenses: Somewhat hard  Food Insecurity: No  Food Insecurity   Worried About Charity fundraiser in the Last Year: Never true   Ran Out of Food in the Last Year: Never true  Transportation Needs: Unmet Transportation Needs   Lack of Transportation (Medical): Yes   Lack of Transportation (Non-Medical): No  Physical Activity: Not on file  Stress: Not on file  Social Connections: Not on file  Intimate Partner Violence: Not on file    Physical Exam Vitals and nursing note reviewed.  Constitutional:      Appearance: Normal appearance. He is normal weight.  HENT:     Head: Normocephalic.     Nose: Nose normal.     Mouth/Throat:     Mouth: Mucous membranes are moist.     Pharynx: Oropharynx is clear.  Eyes:     Conjunctiva/sclera: Conjunctivae normal.     Pupils: Pupils are equal, round, and reactive to light.  Cardiovascular:     Rate and Rhythm: Normal rate and regular rhythm.     Pulses: Normal pulses.     Heart sounds: Normal heart sounds.  Pulmonary:     Effort: Pulmonary effort is normal. No respiratory distress.     Breath sounds: Normal breath sounds. No stridor. No wheezing, rhonchi or rales.  Abdominal:     General: Abdomen is flat.     Palpations: Abdomen is soft.     Comments: "Stomach pain"    Musculoskeletal:        General: Swelling present. Normal range of motion.     Cervical back: Normal range of motion.     Right lower leg: Edema present.     Left lower leg: Edema present.     Comments: Redness, swelling, weeping blisters on lower legs.   Skin:    General: Skin is warm and dry.     Capillary Refill: Capillary refill takes less than 2 seconds.  Neurological:     General: No focal deficit present.     Mental Status: He is alert. Mental status is at baseline.  Psychiatric:        Mood and Affect: Mood normal.    Arrived for home visit for Kegan where he was alert and oriented seated in his recliner. Amyr reports feeling "sick" complaining he has stomach pain and lower leg pain. These complaints are normal for him on a daily basis. He denies chest pain, dizziness, shortness of breath but states his lower legs are swollen and painful. Alix is taking anti-biotics for same in which he will complete in two days. I obtained vitals and assessment as noted. Ezio reports RN has not come out to preform UNNA BOOTS yet, I made HF clinic provider [facility] aware of same. She is having [place] [facility] follow up. I reviewed medications and filled two weeks of pill boxes for Alezander. Derelle has no upcoming appointments in the coming weeks. I will return to see him on 10/18. He understands to reach out to HF clinic or Katie (CP) for further if needed.   Burl was encouraged to limit his fluid intake as well as his salt intake as he admits to eating pot roast and pickles as well as juices and salty chips. I reminded him of the daily limits and how he should be aware of this causing his weight gain and worsening symptoms. He verbalized understanding.   Cardio Mems reading submitted and below "goal line" with a good report.   I will see Jerrin in two weeks. Home visit complete.  Refills: -Gabapentin -Torsemide -Flomax -Zoloft -Inspra     Future Appointments  Date Time Provider  [place]  02/05/2021  9:30 AM [facility] PA/NP [facility] None     ACTION: Home visit completed

## 2021-01-19 ENCOUNTER — Encounter (HOSPITAL_COMMUNITY): Payer: Self-pay

## 2021-01-20 ENCOUNTER — Other Ambulatory Visit (HOSPITAL_COMMUNITY): Payer: Self-pay | Admitting: Cardiology

## 2021-01-20 ENCOUNTER — Telehealth (HOSPITAL_COMMUNITY): Payer: Self-pay | Admitting: *Deleted

## 2021-01-20 ENCOUNTER — Telehealth (HOSPITAL_COMMUNITY): Payer: Self-pay

## 2021-01-20 NOTE — Telephone Encounter (Signed)
Pt said he will get the home aide to pick up his meds for him.

## 2021-01-20 NOTE — Telephone Encounter (Signed)
I contacted pharmacy to call in refills:  Torsemide Flomax Zoloft Inspra Gabapentin  He reports that nobody came out to place his unna boots yet and this was suppose to be done 2-3 wks ago per pt.  I spoke to triage nurse and she will call directly to send over the order again-it was sent before but they didn't get it so it was sent again, but Delana Meyer will call directly today.   Marylouise Stacks, EMT-Paramedic  01/20/21

## 2021-01-20 NOTE — Telephone Encounter (Signed)
Tonya,RN with [facility] called to reports weight gain of 6lbs z 1week. Pts current weight 377lbs. Also reports BLEE 2+edema pitting in feet.  Pt admitted to eating tv dinners and food high in sodium all week to RN. Pt is scheduled to take metolazone today. Per Dr.McLean take metolazone and medications as directed limit salt and fluid intake. Call if weight and swelling are not trending down tomorrow. RN aware.

## 2021-01-21 ENCOUNTER — Telehealth (HOSPITAL_COMMUNITY): Payer: Self-pay | Admitting: Adult Health

## 2021-01-21 NOTE — Telephone Encounter (Signed)
Cardiomems reading for 10/9  and 10/10 were both erroneous. No reading sent today.  Asked him to send reading.   Mr Charles Gray verbalized understanding.    Jakwan Sally  NP-c  3:38 PM

## 2021-01-27 ENCOUNTER — Telehealth (HOSPITAL_COMMUNITY): Payer: Self-pay

## 2021-01-27 NOTE — Telephone Encounter (Signed)
Spoke to [facility] who reports his weight is up 7 lbs, his legs are swollen and oozing. He stated he has not yet received his [employer] but had a nurse come out but she did not place any wraps on his legs. He states he has been med compliant this week. I plan to see Esker in the morning at 0900. He agreed with plan.

## 2021-01-28 ENCOUNTER — Other Ambulatory Visit (HOSPITAL_COMMUNITY): Payer: Self-pay

## 2021-01-28 NOTE — Progress Notes (Signed)
Paramedicine Encounter    Patient ID: Charles Gray, male    DOB: [birth year], 65 y.o.   MRN: [id number]   Patient Care Team: Enid Skeens., MD as PCP - General (Family Medicine) Berniece Salines, DO as PCP - Cardiology (Cardiology) Jorge Ny, LCSW as Social Worker (Licensed Clinical Social Worker)  Patient Active Problem List   Diagnosis Date Noted   Acute encephalopathy 12/21/2020   COPD (chronic obstructive pulmonary disease) ([place]) 12/21/2020   Chronic diastolic (congestive) heart failure ([place]) 11/14/2020   Syncope 10/28/2020   CHF (congestive heart failure) ([place]) 10/28/2020   Chronic venous stasis dermatitis of both lower extremities 08/27/2020   Cellulitis of both lower extremities 08/14/2020   Microcytic anemia 08/14/2020   GERD without esophagitis 06/10/2020   Atrial fibrillation with rapid ventricular response ([place]) 03/24/2020   Acute on chronic right heart failure ([place]) 01/27/2020   Acute respiratory failure with hypoxia ([place])    Fatty liver disease, nonalcoholic    Shock ([place])    Personal history of DVT (deep vein thrombosis) 12/26/2019   Chronic anticoagulation 12/26/2019   Palpitations 12/26/2019   AF (paroxysmal atrial fibrillation) ([place]) 12/23/2019   Heart failure, systolic, acute ([place]) 78/29/5621   Acute on chronic congestive heart failure (HCC)    Chronic diastolic HF (heart failure) ([place]) 08/23/2019   Chest pain 08/23/2019   Lactic acidosis 08/23/2019   History of pulmonary embolism 08/12/2019   Atrial tachycardia ([place]) 08/12/2019   Occasional tremors 08/12/2019   OSA (obstructive sleep apnea) 08/12/2019   Acute on chronic right-sided heart failure ([place]) 08/06/2019   Pulmonary embolism ([place]) 06/08/2019   Normocytic anemia 06/08/2019   Pulmonary emboli ([place]) 06/08/2019   Chronic heart failure with preserved ejection fraction ([place]) 05/30/2019   Class 2 severe obesity due to excess calories with serious comorbidity and body mass index (BMI) of 39.0 to  39.9 in adult ([place]) 05/30/2019   Morbid obesity with BMI of 40.0-44.9, adult ([place]) 05/30/2019   Hyponatremia    Drug induced constipation    Peripheral edema    Hypotension due to drugs    Hypoalbuminemia due to protein-calorie malnutrition (HCC)    Hypokalemia    Thrombocytopenia (HCC)    Anemia of chronic disease    Cirrhosis of liver without ascites (HCC)    Chronic pain syndrome    Debility 03/13/2019   Portal hypertensive gastropathy ([place]) 03/06/2019   Dyspnea    GIB (gastrointestinal bleeding) 03/04/2019   Mixed hyperlipidemia 01/24/2019   Insomnia 01/24/2019   Hyperlipidemia 01/24/2019   Essential hypertension 01/24/2019   Lumbar disc herniation 01/24/2019    Current Outpatient Medications:    acetaminophen (TYLENOL) 500 MG tablet, Take 1,000 mg by mouth every 6 (six) hours as needed for moderate pain or headache., Disp: , Rfl:    apixaban (ELIQUIS) 5 MG TABS tablet, Take 1 tablet (5 mg total) by mouth 2 (two) times daily., Disp: 60 tablet, Rfl: 0   Buprenorphine HCl-Naloxone HCl 8-2 MG FILM, Place 1 tablet under the tongue 2 (two) times daily., Disp: , Rfl:    eplerenone (INSPRA) 50 MG tablet, Take 2 tablets (100 mg total) by mouth daily., Disp: 60 tablet, Rfl: 3   gabapentin (NEURONTIN) 800 MG tablet, Take 1 tablet (800 mg total) by mouth 3 (three) times daily., Disp: 90 tablet, Rfl: 1   lactulose (CHRONULAC) 10 GM/15ML solution, Take 15 mLs (10 g total) by mouth 3 (three) times daily., Disp: 946 mL, Rfl: 1   metolazone (ZAROXOLYN) 2.5 MG tablet,  Take 1 tablet (2.5 mg total) by mouth 3 (three) times a week. Monday, Wednesday and Fridays, Disp: 14 tablet, Rfl: 0   metoprolol succinate (TOPROL XL) 25 MG 24 hr tablet, Take 1 tablet (25 mg total) by mouth 2 (two) times daily., Disp: 60 tablet, Rfl: 2   ondansetron (ZOFRAN-ODT) 4 MG disintegrating tablet, TAKE 1 TABLET BY MOUTH EVERY 8 HOURS AS NEEDED FOR NAUSEA OR VOMITING, Disp: 20 tablet, Rfl: 2   pantoprazole (PROTONIX) 40 MG  tablet, Take 40 mg by mouth 2 (two) times daily., Disp: , Rfl:    polyethylene glycol powder (GLYCOLAX/MIRALAX) 17 GM/SCOOP powder, Take 17 g by mouth daily. (Patient taking differently: Take 17 g by mouth daily as needed for mild constipation or moderate constipation.), Disp: 255 g, Rfl: 0   potassium chloride SA (KLOR-CON) 20 MEQ tablet, Take 4 tablets (80 mEq total) by mouth 2 (two) times daily. Extra 40 mEq with metolazone days., Disp: 260 tablet, Rfl: 0   pravastatin (PRAVACHOL) 20 MG tablet, Take 1 tablet (20 mg total) by mouth daily., Disp: 30 tablet, Rfl: 1   sertraline (ZOLOFT) 25 MG tablet, TAKE 1 TABLET BY MOUTH DAILY, Disp: 30 tablet, Rfl: 11   sodium chloride (OCEAN) 0.65 % SOLN nasal spray, Place 1 spray into both nostrils as needed for congestion., Disp: , Rfl:    tamsulosin (FLOMAX) 0.4 MG CAPS capsule, Take 1 capsule (0.4 mg total) by mouth daily., Disp: 30 capsule, Rfl: 0   torsemide (DEMADEX) 100 MG tablet, Take 1 tablet (100 mg total) by mouth 2 (two) times daily., Disp: 60 tablet, Rfl: 3   VENTOLIN HFA 108 (90 Base) MCG/ACT inhaler, Inhale 1 puff into the lungs every 4 (four) hours as needed for shortness of breath., Disp: 6.7 g, Rfl: 1   zolpidem (AMBIEN) 10 MG tablet, Take 1 tablet (10 mg total) by mouth at bedtime as needed for sleep., Disp: 30 tablet, Rfl: 0 Allergies  Allergen Reactions   Penicillins     Broke out in convulsions as a child  Did it involve swelling of the face/tongue/throat, SOB, or low BP? N/A Did it involve sudden or severe rash/hives, skin peeling, or any reaction on the inside of your mouth or nose? N/A Did you need to seek medical attention at a hospital or doctor's office?N/A When did it last happen? N/A If all above answers are "NO", may proceed with cephalosporin use.     Social History   Socioeconomic History   Marital status: Single    Spouse name: Not on file   Number of children: Not on file   Years of education: Not on file   Highest  education level: Not on file  Occupational History   Not on file  Tobacco Use   Smoking status: Never   Smokeless tobacco: Never  Vaping Use   Vaping Use: Never used  Substance and Sexual Activity   Alcohol use: Not Currently   Drug use: Not Currently    Types: Marijuana    Comment: Smoked for 30 years   Sexual activity: Not on file  Other Topics Concern   Not on file  Social History Narrative   Not on file   Social Determinants of Health   Financial Resource Strain: Medium Risk   Difficulty of Paying Living Expenses: Somewhat hard  Food Insecurity: No Food Insecurity   Worried About Running Out of Food in the Last Year: Never true   Ran Out of Food in the Last Year: Never  true  Transportation Needs: Unmet Transportation Needs   Lack of Transportation (Medical): Yes   Lack of Transportation (Non-Medical): No  Physical Activity: Not on file  Stress: Not on file  Social Connections: Not on file  Intimate Partner Violence: Not on file    Physical Exam Vitals reviewed.  Constitutional:      Appearance: Normal appearance. He is obese.  HENT:     Head: Normocephalic.     Nose: Nose normal.     Mouth/Throat:     Mouth: Mucous membranes are moist.     Pharynx: Oropharynx is clear.  Eyes:     Conjunctiva/sclera: Conjunctivae normal.     Pupils: Pupils are equal, round, and reactive to light.  Cardiovascular:     Rate and Rhythm: Normal rate and regular rhythm.     Pulses: Normal pulses.     Heart sounds: Normal heart sounds.  Pulmonary:     Effort: Pulmonary effort is normal. No respiratory distress.     Breath sounds: Normal breath sounds. No wheezing, rhonchi or rales.  Abdominal:     General: There is no distension.     Palpations: Abdomen is soft.     Tenderness: There is no abdominal tenderness. There is no guarding.  Musculoskeletal:        General: Swelling and tenderness present.     Cervical back: Normal range of motion.     Right lower leg: Edema  present.     Left lower leg: Edema present.     Comments: Bilateral leg swelling, pain, weeping, redness and tenderness.   Skin:    General: Skin is warm and dry.     Capillary Refill: Capillary refill takes less than 2 seconds.     Coloration: Skin is pale.  Neurological:     General: No focal deficit present.     Mental Status: He is alert. Mental status is at baseline.  Psychiatric:        Mood and Affect: Mood normal.    Arrived for home visit for Charles Gray who was alert and oriented reporting to be feeling "sick". Charles Gray reports hi lower legs and stomach hurt. Charles Gray has redness and tenderness to both lower legs with weeping of fluid noted along with a yellow pus coming from some open wounds on both lower legs. Charles Gray had finished a round of antibiotics and was to have had [employer] placed however the nursing group never came out to do so. I reccommended a referral for the Lymphedema Gray for Charles Gray. Charles Gray agreed, will follow up on same. I advised Charles Gray to reach out to PCP or wound Gray for follow up reference his legs. He advised he feels he would be best seen in the ER at [facility]. I advised him to follow up with PCP or specialist for same but to do what he felt was best for him. He reports normal shortness of breath, fatigue and weakness for him. He denied chest pain. He states he gets dizzy sometimes but sits down and it gets better. Vitals were normotensive.   Charles Gray is scheduled for HF Gray follow up next week, I set up transportation for same.   I obtained vitals and they are as noted. Charles Gray had been compliant with his medications over the last two weeks. He did admit to eating salty snacks, meals and sodas. I reminded him the importance of not doing so. He agreed.   Bilal reports pain in lower legs, abdominal pain from constipation which he  has been instructed to use Miralax for same. He stated he would do better about taking the Miralax.   I completed one week of medications for  [place]. We will meet in the Gray in one week for visit.   Yesterdays weight 377lbs Todays weight 375lbs   Refills: Metoprolol  Eliquis Potassium     Future Appointments  Date Time Provider [place]  02/05/2021  9:30 AM [facility] PA/NP [facility] None     ACTION: Home visit completed

## 2021-01-30 ENCOUNTER — Telehealth (HOSPITAL_COMMUNITY): Payer: Self-pay

## 2021-01-30 ENCOUNTER — Other Ambulatory Visit: Payer: Self-pay | Admitting: Infectious Diseases

## 2021-01-30 ENCOUNTER — Other Ambulatory Visit (HOSPITAL_COMMUNITY): Payer: Self-pay | Admitting: Family Medicine

## 2021-01-30 DIAGNOSIS — L0390 Cellulitis, unspecified: Secondary | ICD-10-CM

## 2021-01-30 MED ORDER — CEFADROXIL 500 MG PO CAPS: 1000.0000 mg | ORAL_CAPSULE | Freq: Two times a day (BID) | ORAL | 0 refills | Status: DC

## 2021-01-30 NOTE — Telephone Encounter (Signed)
Late entry Spoke with Darlene [phone] [facility] to send someone out. Original order was overlooked with recent staff changes

## 2021-01-30 NOTE — Telephone Encounter (Signed)
Spoke to [employer] an notified him of [facility] services will be reaching out for [employer] in home.   Infectious Disease will be calling for appointment set up as well as antibiotic being sent into [facility]. Him and his aide are aware of same and will begin antibiotic asap. I will follow up next week.

## 2021-01-30 NOTE — Progress Notes (Unsigned)
Discussed antibiotic choice with Janene Madeira, NP with Infectious Disease. Will send Rx for cefadroxil 1000 mg bid x 7 days for stasis dermatitis. He has a childhood allergy to PCN, however received cephalosporins in hospital, so OK to proceed with above medication.

## 2021-01-31 ENCOUNTER — Other Ambulatory Visit (HOSPITAL_COMMUNITY): Payer: Self-pay | Admitting: Family Medicine

## 2021-02-03 ENCOUNTER — Telehealth (HOSPITAL_COMMUNITY): Payer: Self-pay

## 2021-02-03 ENCOUNTER — Telehealth: Payer: Self-pay

## 2021-02-03 ENCOUNTER — Other Ambulatory Visit (HOSPITAL_COMMUNITY): Payer: Self-pay | Admitting: Cardiology

## 2021-02-03 DIAGNOSIS — I5032 Chronic diastolic (congestive) heart failure: Secondary | ICD-10-CM

## 2021-02-03 DIAGNOSIS — I872 Venous insufficiency (chronic) (peripheral): Secondary | ICD-10-CM

## 2021-02-03 NOTE — Telephone Encounter (Signed)
Patient's aide called to ask about antibiotics that were prescribed. They tried to fill Cefadroxil however pharmacist at the pharmacy stated she needed to follow up with provider as he has a history of GI bleed and the medication can cause this. RN to forward to pharmacy team in clinic to review.   Kysean Sweet Lorita Officer, RN

## 2021-02-03 NOTE — Telephone Encounter (Signed)
Looks like [place] FNP prescribed a 7-day course for recurrent cellulitis. Patient should be fine taking cefadroxil for 1 week. The risk for bleeding is extremely low and usually if someone continues a beta-lactam for weeks. Thank you for checking!

## 2021-02-03 NOTE — Telephone Encounter (Signed)
Set up rides for Charles Gray for 10/26 and 10/27. [facility] transport dispatch will reach out with pick up times.

## 2021-02-03 NOTE — Telephone Encounter (Signed)
I called and let her know it was ok for him to start taking. Thanks for reviewing.   Catelyn Friel Lorita Officer, RN

## 2021-02-04 ENCOUNTER — Telehealth (HOSPITAL_COMMUNITY): Payer: Self-pay

## 2021-02-04 NOTE — Telephone Encounter (Signed)
Spoke to [place] and confirmed appointments with him as well as his transportation. I will see Charles Gray in the morning at HF clinic visit.

## 2021-02-04 NOTE — Progress Notes (Signed)
ADVANCED HF CLINIC NOTE  Primary Care: Enid Skeens., MD Primary Cardiologist: Dr. Berniece Salines, DO  HF Cardiology: Dr. [name]  HPI: 65 y.o. 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    He has had multiple hospitalizations in 2021 for CHF exacerbation, requiring IV Lasix and change in PO regimen from lasix to torsemide. Echo 4/21 showed normal LVEF 55-60% w/ G1DD. RV not well visualized, but felt to have component of RV failure. [place] 4/21 showed normal PA pressure and normal cardiac output. PFTs also completed and c/w mild obstructive airway disease.   In 7/21, he had a coronary CTA.  This showed no coronary calcium but there was a concern for soft plaque in the distal LCx and distal RCA with hemodynamically significant stenosis.  He was managed medically. He has not had any chest pain, but was started on ranolazine after his CTA.  He feels like it makes his "head fuzzy."    Admitted with A/C diastolic heart failure. Diuresed with IV lasix and transitioned to bumex 3 mg twice a day. Discharge weight 360 pounds. Discharged 02/09/2020.   He was admitted again in 12/21 with CHF.  Echo in 12/21 showed EF 46-65%, grade 2 diastolic dysfunction, RV poorly visualized.   OV on 12/21 still having significant exertional dyspnea, short of breath just walking around the house.  + Orthopnea, has to sleep on the sofa.  Sleeps poorly, cannot tolerate CPAP.  Cardiomems was discussed but patient reluctant, Jardiance added, metolazone increased to 2 x/week, metoprolol stopped, and Toprol XL 25 mg daily started.  Admitted 9/93/57 with A/C diastolic heart failure. Diuresed with IV lasix + metolazone. Transitioned to torsemide 60 mg twice a day and metolazone twice a day. D/C weight 364 pounds.   Admitted 5/28//22 with cellulitis. Treated with antibiotics.   Returned 09/10/20 for HF follow up. Had palpitations  and EMS checked his EKG. EKG showed SR with rate in the 70s. Having ongoing leg swelling. SOB with exertion.  Weight at home 373-381 pounds. Zio patch placed.  Admitted 09/20/20 admitted with SOB and leg swelling secondary to A/CHF. He was diuresed with IV lasix 5 L, weight 371 at discharge.  He was admitted for A/C CHF 7/22 after presenting with LE swelling and complaints of dizziness with fall.  He had failed outpatient IV diuresis. He was diuresed with IV lasix + metolazone. He underwent RHC showing optimized filling pressures and preserved CO/CI. Discussed Cardiomems inpatient , and he agreed to work up. Insurance approval pending. He was discharged on torsemide 100 mg bid + metolazone M/W/F. Discharge weight 374 lbs.  RHC (8/22) showed normal filling pressures & he underwent Cardiomems placement.  Admitted 9/10-9/12/22 for acute encephalopathy, felt secondary to polypharmacy. Ambien and gabapentin were briefly held. Ammonia mildly elevated and he was started on lactulose. Mentation returned to baseline and he was discharged home with Home Health PT/OT.  Today he returns for HF follow up with paramedicine. He feels poorly. He is dizzy and legs remain swollen. He is able to get around the house with a walker and has a home health aid help with meals. He had UNNA boots on last week that seemed to be helping, but he had to remove them after they became soiled with urine. Denies CP. He chronically sleeps in a recliner. Appetite ok. No fever or chills. Weights stable with Paramedicine. Taking all medications. He was only able to take 2  days of cefadroxil as he said it upset his stomach.  Cardiomems: PAD 11 on 02/03/21, (goal 10).  ECG (personally reviewed): none ordered today.  Labs (6/21): K 4.3, creatinine 1.03 Labs (10/21): LDL 73 Labs (12/21): K 3.8, creatinine 0.98 Labs (3/22): K 3.1 Creatinine 0.9  Labs (5/22): K 3.7 Creatinine 0.94  Labs (6/22): K 3.3, creatinine 1.13 Labs (7/22): K 3.5,  creatinine 0.99 Labs (8/22): K 3.7, creatinine 1.09, hgb 10.7 Labs (9/22): K 3.8, creatinine 0.86  PMH: 1. HTN 2. OSA 3. Hyperlipidemia 4. Low back pain 5. PE/DVT in 2/21.  - V/Q scan in 6/21 was not suggestive of chronic PE.  6. Nonalcoholic steatohepatitis.  7. GI bleed in 10/20.  8. Anxiety 9. CAD: Coronary CTA in 7/21 with calcium score 0 but concern for soft plaque distal CFX and RCA.  By [facility], there was flow-limiting stenosis in distal CFX and distal RCA.  10. Chronic diastolic CHF: Echo in 9/03 with EF 55-60%, RV poorly visualized.  - RHC (4/21): mean RA 8, PA 20/8, mean PCWP 11, CI 2.8 - Echo (12/21): EF 83-33%, grade 2 diastolic dysfunction, RV poorly visualized.   - RHC (8/22) normal filling pressures, RA mean 7, RV 29/9, PA 32/15, mean 22, PCWP mean 13, CO/CI 7.32/2.41. - Cardiomems (8/22) placement. 11. PFTs (4/21): Mild obstruction.  12. ABIs (10/21): Normal.  Review of Systems: All systems reviewed and negative except as per HPI.   Current Outpatient Medications  Medication Sig Dispense Refill   acetaminophen (TYLENOL) 500 MG tablet Take 1,000 mg by mouth every 6 (six) hours as needed for moderate pain or headache.     apixaban (ELIQUIS) 5 MG TABS tablet Take 1 tablet (5 mg total) by mouth 2 (two) times daily. 60 tablet 0   Buprenorphine HCl-Naloxone HCl 8-2 MG FILM Place 1 tablet under the tongue 2 (two) times daily.     eplerenone (INSPRA) 50 MG tablet Take 2 tablets (100 mg total) by mouth daily. 60 tablet 3   gabapentin (NEURONTIN) 800 MG tablet Take 1 tablet (800 mg total) by mouth 3 (three) times daily. 90 tablet 1   lactulose (CHRONULAC) 10 GM/15ML solution Take 15 mLs (10 g total) by mouth 3 (three) times daily. 946 mL 1   metolazone (ZAROXOLYN) 2.5 MG tablet Take 1 tablet (2.5 mg total) by mouth 3 (three) times a week. Monday, Wednesday and Fridays 14 tablet 0   metoprolol succinate (TOPROL XL) 25 MG 24 hr tablet Take 1 tablet (25 mg total) by mouth 2 (two)  times daily. 60 tablet 2   ondansetron (ZOFRAN-ODT) 4 MG disintegrating tablet TAKE 1 TABLET BY MOUTH EVERY 8 HOURS AS NEEDED FOR NAUSEA OR VOMITING 20 tablet 2   pantoprazole (PROTONIX) 40 MG tablet Take 40 mg by mouth 2 (two) times daily.     polyethylene glycol (MIRALAX / GLYCOLAX) 17 g packet Take 17 g by mouth as needed.     potassium chloride SA (KLOR-CON) 20 MEQ tablet Take 4 tablets (80 mEq total) by mouth 2 (two) times daily. Extra 40 mEq with metolazone days. 260 tablet 0   pravastatin (PRAVACHOL) 20 MG tablet Take 1 tablet (20 mg total) by mouth daily. 30 tablet 1   sertraline (ZOLOFT) 25 MG tablet TAKE 1 TABLET BY MOUTH DAILY 30 tablet 11   sodium chloride (OCEAN) 0.65 % SOLN nasal spray Place 1 spray into both nostrils as needed for congestion.     tamsulosin (FLOMAX) 0.4 MG CAPS capsule Take 1 capsule (  0.4 mg total) by mouth daily. 30 capsule 0   torsemide (DEMADEX) 100 MG tablet Take 1 tablet (100 mg total) by mouth 2 (two) times daily. 60 tablet 3   VENTOLIN HFA 108 (90 Base) MCG/ACT inhaler Inhale 1 puff into the lungs every 4 (four) hours as needed for shortness of breath. 6.7 g 1   zolpidem (AMBIEN) 10 MG tablet Take 1 tablet (10 mg total) by mouth at bedtime as needed for sleep. 30 tablet 0   No current facility-administered medications for this encounter.   Allergies  Allergen Reactions   Penicillins     Broke out in convulsions as a child  Did it involve swelling of the face/tongue/throat, SOB, or low BP? N/A Did it involve sudden or severe rash/hives, skin peeling, or any reaction on the inside of your mouth or nose? N/A Did you need to seek medical attention at a hospital or doctor's office?N/A When did it last happen? N/A If all above answers are "NO", may proceed with cephalosporin use.   Social History   Socioeconomic History   Marital status: Single    Spouse name: Not on file   Number of children: Not on file   Years of education: Not on file   Highest  education level: Not on file  Occupational History   Not on file  Tobacco Use   Smoking status: Never   Smokeless tobacco: Never  Vaping Use   Vaping Use: Never used  Substance and Sexual Activity   Alcohol use: Not Currently   Drug use: Not Currently    Types: Marijuana    Comment: Smoked for 30 years   Sexual activity: Not on file  Other Topics Concern   Not on file  Social History Narrative   Not on file   Social Determinants of Health   Financial Resource Strain: Medium Risk   Difficulty of Paying Living Expenses: Somewhat hard  Food Insecurity: No Food Insecurity   Worried About Running Out of Food in the Last Year: Never true   Ran Out of Food in the Last Year: Never true  Transportation Needs: Unmet Transportation Needs   Lack of Transportation (Medical): Yes   Lack of Transportation (Non-Medical): No  Physical Activity: Not on file  Stress: Not on file  Social Connections: Not on file  Intimate Partner Violence: Not on file    Family History  Problem Relation Age of Onset   Hyperlipidemia Mother    Hypertension Mother    Stroke Mother    CAD Maternal Grandmother    CAD Maternal Grandfather    BP 118/70   Pulse 81   Wt (!) 172.4 kg (380 lb)   SpO2 96%   BMI 39.73 kg/m   Wt Readings from Last 3 Encounters:  02/05/21 (!) 172.4 kg (380 lb)  01/28/21 (!) 170.1 kg (375 lb)  01/14/21 (!) 168.3 kg (371 lb)   PHYSICAL EXAM: General:  NAD. No resp difficulty, arrived in [facility] HEENT: Normal Neck: Supple. No JVD. Carotids 2+ bilat; no bruits. No lymphadenopathy or thryomegaly appreciated. Cor: PMI nondisplaced. Regular rate & rhythm. No rubs, gallops or murmurs. Lungs: Clear Abdomen: Obese, nontender, nondistended. No hepatosplenomegaly. No bruits or masses. Good bowel sounds. Extremities: No cyanosis, clubbing, rash, 2+ BLE edema, skin changes consistent with venous stasis/hemosiderin staining + erythema, no weeping. Neuro: Alert & oriented x 3, cranial nerves  grossly intact. Moves all 4 extremities w/o difficulty. Flat pleasant.  ASSESSMENT & PLAN: 1. Chronic diastolic CHF with  suspected RV dysfunction: Echo 7/22 with EF 60-65%, mild LVH, normal RV, unable to estimate PA systolic pressure. Multiple CHF admissions despite aggressive outpatient diuretic regimen and paramedicine.  RHC (7/22) with optimized filling pressures, PCWP 13, CO/CI 7.3/2.4. Received IV lasix 80 mg through paramedicine 11/12/20. RHC (8/22) with normal filling pressures; underwent placement of Cardiomems. NYHA III at baseline. He is mildly volume overloaded on exam (LE edema) however, I suspect much of this LE edema is due to venous stasis.  Needs to be more compliant with compression stockings at home.  - Needs UNNA boots back on. Will arrange with Home Health. - He can take an extra metolazone 2.5 mg today (86m total) + extra 20 KCl. BMET today. - Continue torsemide 100 mg bid + metolazone 2.5 mg on M/W/F + extra 40 KCl on metolazone days.  - Continue eplerenone 100 mg daily.  - Off Jardiance (GI upset). 2. H/o PE/DVT: Suspect due to sedentary lifestyle.  Occurred in 2/21.  V/Q scan in 6/21 did not show evidence for chronic PE.   - Continue apixaban. No bleeding issues.  3. OSA: Cannot tolerate CPAP. 4. Anxiety/depression: This is a major issue.  We discussed increasing his SSRI and referring to counselor again today. He is agreeable to medication changes only at this time. - Increase sertraline to 50 mg daily. He will need PCP follow up for continued management. 5. CAD: Cath 1/22 with no significant CAD. No CP. 6. Hypertension: Stable. - Continue Toprol XL 25 mg bid.   7. Atrial fibrillation: Paroxysmal. Regular on exam. - Continue apixaban. No bleeding issues. - Continue Toprol XL. 8. Lower extremity cellulitis/stasis dermatitis: Admitted 5/22 with possible BLE cellulitis. Legs are more edematous today with erythema and new blisters. He has been on multiple rounds of Keflex,  Levaquin and doxycycline over the last several months. Previously discussed with SJanene Madeira NP with ID, and recommended starting cefadroxil 1000 mg bid x 7 days, however he took this for 2 days and stopped due to GI upset. Compliance has remained a barrier. - I offered trial of Zofran to take before his abx, he says he has tried this with no success. - Unna boots as above.  - He has appt with ID tomorrow, needs to keep this appointment.  Follow up in 4 weeks with APP and 3 months with Dr. MAundra Dubin  JMaricela BoMBristow Medical CenterFNP-BC  02/05/2021

## 2021-02-05 ENCOUNTER — Encounter (HOSPITAL_COMMUNITY): Payer: Self-pay

## 2021-02-05 ENCOUNTER — Other Ambulatory Visit: Payer: Self-pay

## 2021-02-05 ENCOUNTER — Ambulatory Visit (HOSPITAL_COMMUNITY)
Admission: RE | Admit: 2021-02-05 | Discharge: 2021-02-05 | Disposition: A | Discharge status: Home / Self Care | Payer: Medicare HMO | Source: Ambulatory Visit | Attending: Family Medicine | Admitting: Family Medicine

## 2021-02-05 ENCOUNTER — Other Ambulatory Visit (HOSPITAL_COMMUNITY): Payer: Self-pay

## 2021-02-05 VITALS — BP 118/70 | HR 81 | Wt 380.0 lb

## 2021-02-05 DIAGNOSIS — I5032 Chronic diastolic (congestive) heart failure: Secondary | ICD-10-CM | POA: Diagnosis not present

## 2021-02-05 DIAGNOSIS — M7989 Other specified soft tissue disorders: Secondary | ICD-10-CM | POA: Diagnosis not present

## 2021-02-05 DIAGNOSIS — I5033 Acute on chronic diastolic (congestive) heart failure: Secondary | ICD-10-CM | POA: Diagnosis not present

## 2021-02-05 DIAGNOSIS — I480 Paroxysmal atrial fibrillation: Secondary | ICD-10-CM | POA: Diagnosis not present

## 2021-02-05 DIAGNOSIS — J449 Chronic obstructive pulmonary disease, unspecified: Secondary | ICD-10-CM | POA: Insufficient documentation

## 2021-02-05 DIAGNOSIS — I2510 Atherosclerotic heart disease of native coronary artery without angina pectoris: Secondary | ICD-10-CM | POA: Insufficient documentation

## 2021-02-05 DIAGNOSIS — I110 Hypertensive heart disease with heart failure: Secondary | ICD-10-CM | POA: Diagnosis not present

## 2021-02-05 DIAGNOSIS — Z09 Encounter for follow-up examination after completed treatment for conditions other than malignant neoplasm: Secondary | ICD-10-CM | POA: Diagnosis not present

## 2021-02-05 DIAGNOSIS — F419 Anxiety disorder, unspecified: Secondary | ICD-10-CM | POA: Insufficient documentation

## 2021-02-05 DIAGNOSIS — Z86718 Personal history of other venous thrombosis and embolism: Secondary | ICD-10-CM | POA: Diagnosis not present

## 2021-02-05 DIAGNOSIS — G4733 Obstructive sleep apnea (adult) (pediatric): Secondary | ICD-10-CM | POA: Insufficient documentation

## 2021-02-05 DIAGNOSIS — Z79899 Other long term (current) drug therapy: Secondary | ICD-10-CM | POA: Insufficient documentation

## 2021-02-05 DIAGNOSIS — Z86711 Personal history of pulmonary embolism: Secondary | ICD-10-CM | POA: Diagnosis not present

## 2021-02-05 DIAGNOSIS — E669 Obesity, unspecified: Secondary | ICD-10-CM | POA: Insufficient documentation

## 2021-02-05 DIAGNOSIS — Z8249 Family history of ischemic heart disease and other diseases of the circulatory system: Secondary | ICD-10-CM | POA: Insufficient documentation

## 2021-02-05 DIAGNOSIS — F32A Depression, unspecified: Secondary | ICD-10-CM | POA: Diagnosis not present

## 2021-02-05 DIAGNOSIS — I872 Venous insufficiency (chronic) (peripheral): Secondary | ICD-10-CM | POA: Insufficient documentation

## 2021-02-05 DIAGNOSIS — K7460 Unspecified cirrhosis of liver: Secondary | ICD-10-CM | POA: Insufficient documentation

## 2021-02-05 DIAGNOSIS — Z6839 Body mass index (BMI) 39.0-39.9, adult: Secondary | ICD-10-CM | POA: Diagnosis not present

## 2021-02-05 DIAGNOSIS — I10 Essential (primary) hypertension: Secondary | ICD-10-CM

## 2021-02-05 DIAGNOSIS — R42 Dizziness and giddiness: Secondary | ICD-10-CM | POA: Diagnosis present

## 2021-02-05 DIAGNOSIS — Z7901 Long term (current) use of anticoagulants: Secondary | ICD-10-CM | POA: Insufficient documentation

## 2021-02-05 DIAGNOSIS — L03119 Cellulitis of unspecified part of limb: Secondary | ICD-10-CM | POA: Insufficient documentation

## 2021-02-05 LAB — BASIC METABOLIC PANEL
Anion gap: 8 (ref 5–15)
BUN: 10 mg/dL (ref 8–23)
CO2: 29 mmol/L (ref 22–32)
Calcium: 9.1 mg/dL (ref 8.9–10.3)
Chloride: 97 mmol/L — ABNORMAL LOW (ref 98–111)
Creatinine, Ser: 0.92 mg/dL (ref 0.61–1.24)
GFR, Estimated: >60 mL/min (ref >60)
Glucose, Bld: 128 mg/dL — ABNORMAL HIGH (ref 70–99)
Potassium: 3.9 mmol/L (ref 3.5–5.1)
Sodium: 134 mmol/L — ABNORMAL LOW (ref 135–145)

## 2021-02-05 MED ORDER — SERTRALINE HCL 50 MG PO TABS: 50.0000 mg | ORAL_TABLET | Freq: Every day | ORAL | 3 refills | Status: DC

## 2021-02-05 NOTE — Progress Notes (Signed)
Paramedicine Encounter    Patient ID: Charles Gray, male    DOB: 12-01-1955, 65 y.o.   MRN: [id number]  Met Isabella in clinic today where he was seen by NP [facility]. Weight at 380lbs today. CardioMems reading at 68 yesterday.   Increased Zoloft to 69m  I will make Dr. SWendie Agresteof same.   JJanett Billowwill call and re-order UPublixfor weekly wraps. He follows up with ID clinic tomorrow.   I filled one week of medicines in pill box for Vladimir.   I will see Tavaughn in the home in one week.   Refills: Lactulose Pantoprazole Torsemide       ACTION: Home visit completed

## 2021-02-05 NOTE — Patient Instructions (Signed)
Take Metolazone 5 mg today with an extra 20 meq of potasium Resume normal dose of metolazone 2.5 mg, three times per week (M/W/F)  INCREASE Sertraline 50 mg one tab daily  Labs today We will only contact you if something comes back abnormal or we need to make some changes. Otherwise no news is good news!  Your physician recommends that you schedule a follow-up appointment in: 4 weeks  in the Advanced Practitioners (PA/NP) Clinic and in 3 months with Dr [name]  Do the following things EVERYDAY: Weigh yourself in the morning before breakfast. Write it down and keep it in a log. Take your medicines as prescribed Eat low salt foods--Limit salt (sodium) to 2000 mg per day.  Stay as active as you can everyday Limit all fluids for the day to less than 2 liters  At the [facility], you and your health needs are our priority. As part of our continuing mission to provide you with exceptional heart care, we have created designated Provider Care Teams. These Care Teams include your primary Cardiologist (physician) and Advanced Practice Providers (APPs- Physician Assistants and Nurse Practitioners) who all work together to provide you with the care you need, when you need it.   You may see any of the following providers on your designated Care Team at your next follow up: Dr Glori Bickers Dr Haynes Kerns, NP Lyda Jester, [place] [facility] [place], [place] Audry Riles, PharmD   Please be sure to bring in all your medications bottles to every appointment.   If you have any questions or concerns before your next appointment please send [place] a message through [place] or call our office at [phone].    TO LEAVE A MESSAGE FOR THE NURSE SELECT OPTION 2, PLEASE LEAVE A MESSAGE INCLUDING: YOUR NAME DATE OF BIRTH CALL BACK NUMBER REASON FOR CALL**this is important as we prioritize the call backs  YOU WILL RECEIVE A CALL BACK THE SAME DAY AS LONG AS YOU  CALL BEFORE 4:00 PM .. 4

## 2021-02-06 ENCOUNTER — Ambulatory Visit (INDEPENDENT_AMBULATORY_CARE_PROVIDER_SITE_OTHER): Payer: Medicare HMO | Admitting: Internal Medicine

## 2021-02-06 ENCOUNTER — Emergency Department (HOSPITAL_COMMUNITY)
Admission: EM | Admit: 2021-02-06 | Discharge: 2021-02-06 | Disposition: A | Discharge status: Home / Self Care | Payer: Medicare HMO | Attending: Emergency Medicine | Admitting: Emergency Medicine

## 2021-02-06 ENCOUNTER — Emergency Department (HOSPITAL_COMMUNITY): Payer: Medicare HMO

## 2021-02-06 ENCOUNTER — Telehealth (HOSPITAL_COMMUNITY): Payer: Self-pay

## 2021-02-06 ENCOUNTER — Other Ambulatory Visit: Payer: Self-pay

## 2021-02-06 VITALS — BP 134/75 | HR 79

## 2021-02-06 DIAGNOSIS — Z7951 Long term (current) use of inhaled steroids: Secondary | ICD-10-CM | POA: Insufficient documentation

## 2021-02-06 DIAGNOSIS — R0789 Other chest pain: Secondary | ICD-10-CM | POA: Diagnosis not present

## 2021-02-06 DIAGNOSIS — Z955 Presence of coronary angioplasty implant and graft: Secondary | ICD-10-CM | POA: Insufficient documentation

## 2021-02-06 DIAGNOSIS — Z7901 Long term (current) use of anticoagulants: Secondary | ICD-10-CM | POA: Insufficient documentation

## 2021-02-06 DIAGNOSIS — R079 Chest pain, unspecified: Secondary | ICD-10-CM | POA: Diagnosis not present

## 2021-02-06 DIAGNOSIS — I872 Venous insufficiency (chronic) (peripheral): Secondary | ICD-10-CM

## 2021-02-06 DIAGNOSIS — I5033 Acute on chronic diastolic (congestive) heart failure: Secondary | ICD-10-CM | POA: Insufficient documentation

## 2021-02-06 DIAGNOSIS — I110 Hypertensive heart disease with heart failure: Secondary | ICD-10-CM | POA: Insufficient documentation

## 2021-02-06 DIAGNOSIS — Z79899 Other long term (current) drug therapy: Secondary | ICD-10-CM | POA: Diagnosis not present

## 2021-02-06 LAB — BASIC METABOLIC PANEL
Anion gap: 13 (ref 5–15)
BUN: 14 mg/dL (ref 8–23)
CO2: 31 mmol/L (ref 22–32)
Calcium: 9.3 mg/dL (ref 8.9–10.3)
Chloride: 85 mmol/L — ABNORMAL LOW (ref 98–111)
Creatinine, Ser: 1.03 mg/dL (ref 0.61–1.24)
GFR, Estimated: >60 mL/min (ref >60)
Glucose, Bld: 137 mg/dL — ABNORMAL HIGH (ref 70–99)
Potassium: 3 mmol/L — ABNORMAL LOW (ref 3.5–5.1)
Sodium: 129 mmol/L — ABNORMAL LOW (ref 135–145)

## 2021-02-06 LAB — CBC
HCT: 38.2 % — ABNORMAL LOW (ref 39.0–52.0)
Hemoglobin: 11.7 g/dL — ABNORMAL LOW (ref 13.0–17.0)
MCH: 23.4 pg — ABNORMAL LOW (ref 26.0–34.0)
MCHC: 30.6 g/dL (ref 30.0–36.0)
MCV: 76.6 fL — ABNORMAL LOW (ref 80.0–100.0)
Platelets: 189 10*3/uL (ref 150–400)
RBC: 4.99 MIL/uL (ref 4.22–5.81)
RDW: 16.3 % — ABNORMAL HIGH (ref 11.5–15.5)
WBC: 9.9 10*3/uL (ref 4.0–10.5)
nRBC: 0 % (ref 0.0–0.2)

## 2021-02-06 LAB — TROPONIN I (HIGH SENSITIVITY)
Troponin I (High Sensitivity): 7 ng/L (ref <18)
Troponin I (High Sensitivity): 6 ng/L (ref <18)

## 2021-02-06 MED ORDER — POTASSIUM CHLORIDE CRYS ER 20 MEQ PO TBCR
40.0000 meq | EXTENDED_RELEASE_TABLET | Freq: Once | ORAL | Status: AC
  Administered 2021-02-06: 40 meq via ORAL
  Filled 2021-02-06: qty 2

## 2021-02-06 MED ORDER — ASPIRIN 325 MG PO TABS
325.0000 mg | ORAL_TABLET | Freq: Every day | ORAL | Status: DC
  Administered 2021-02-06: 325 mg via ORAL

## 2021-02-06 MED ORDER — TRIAMCINOLONE ACETONIDE 0.025 % EX OINT: 1.0000 "application " | TOPICAL_OINTMENT | Freq: Two times a day (BID) | CUTANEOUS | 3 refills | Status: DC

## 2021-02-06 MED ORDER — TRIAMCINOLONE ACETONIDE 0.025 % EX OINT: 1.0000 "application " | TOPICAL_OINTMENT | Freq: Two times a day (BID) | CUTANEOUS | 0 refills | Status: DC

## 2021-02-06 NOTE — ED Triage Notes (Signed)
Pt. Stated, I WAS DOWNSTAIRS AND I TOLD THEM I HAD SOME CHEST PAIN AND ARM LEFT IS HURTING, SENT ME UP HERE.

## 2021-02-06 NOTE — Progress Notes (Signed)
[facility] for Infectious Disease  Reason for Consult:recurrent cellulitis Referring Provider: heart failure clinic    Patient Active Problem List   Diagnosis Date Noted   Acute encephalopathy 12/21/2020   COPD (chronic obstructive pulmonary disease) ([place]) 12/21/2020   Chronic diastolic (congestive) heart failure ([place]) 11/14/2020   Syncope 10/28/2020   CHF (congestive heart failure) ([place]) 10/28/2020   Chronic venous stasis dermatitis of both lower extremities 08/27/2020   Cellulitis of both lower extremities 08/14/2020   Microcytic anemia 08/14/2020   GERD without esophagitis 06/10/2020   Atrial fibrillation with rapid ventricular response ([place]) 03/24/2020   Acute on chronic right heart failure ([place]) 01/27/2020   Acute respiratory failure with hypoxia (HCC)    Fatty liver disease, nonalcoholic    Shock ([place])    Personal history of DVT (deep vein thrombosis) 12/26/2019   Chronic anticoagulation 12/26/2019   Palpitations 12/26/2019   AF (paroxysmal atrial fibrillation) ([place]) 12/23/2019   Heart failure, systolic, acute ([place]) 02/54/2706   Acute on chronic congestive heart failure (HCC)    Chronic diastolic HF (heart failure) ([place]) 08/23/2019   Chest pain 08/23/2019   Lactic acidosis 08/23/2019   History of pulmonary embolism 08/12/2019   Atrial tachycardia ([place]) 08/12/2019   Occasional tremors 08/12/2019   OSA (obstructive sleep apnea) 08/12/2019   Acute on chronic right-sided heart failure ([place]) 08/06/2019   Pulmonary embolism ([place]) 06/08/2019   Normocytic anemia 06/08/2019   Pulmonary emboli ([place]) 06/08/2019   Chronic heart failure with preserved ejection fraction ([place]) 05/30/2019   Class 2 severe obesity due to excess calories with serious comorbidity and body mass index (BMI) of 39.0 to 39.9 in adult ([place]) 05/30/2019   Morbid obesity with BMI of 40.0-44.9, adult ([place]) 05/30/2019   Hyponatremia    Drug induced constipation    Peripheral edema     Hypotension due to drugs    Hypoalbuminemia due to protein-calorie malnutrition (HCC)    Hypokalemia    Thrombocytopenia (HCC)    Anemia of chronic disease    Cirrhosis of liver without ascites ([place])    Chronic pain syndrome    Debility 03/13/2019   Portal hypertensive gastropathy ([place]) 03/06/2019   Dyspnea    GIB (gastrointestinal bleeding) 03/04/2019   Mixed hyperlipidemia 01/24/2019   Insomnia 01/24/2019   Hyperlipidemia 01/24/2019   Essential hypertension 01/24/2019   Lumbar disc herniation 01/24/2019      HPI: Charles Gray is a 65 y.o. male obese, cirrhosis, HFpEF, dvt/pe on apixaban, venous stasis, referred here from cardiology clinic for recurrent cellulitis  Patient recently given doxy 9/30-10/05 no help. I see a prescription of cefadroxil a few days ago but patient said he is not taking.   He reports chronic leg swelling and every now and then slight redness No current fever chill Lymphedema clinic referal was just placed and appointment is in December  He reports chest pressure onset last night and left arm numbness. I called his son unable to get in touch. I called ed to report referal for evaluation. We gave him aspirin 325 mg once. He initially refused but after 10-15 minutes agree to go. He is being pushed in wheel chair (which he came with) by 2 of our nursing staff.     Review of Systems: ROS Other ros negative      Past Medical History:  Diagnosis Date   Acute on chronic congestive heart failure (HCC)    Acute on chronic diastolic CHF (congestive heart failure) ([place])  08/23/2019   Acute on chronic right-sided heart failure ([place]) 08/06/2019   Acute respiratory failure with hypoxia (HCC)    Anemia of chronic disease    Atrial fibrillation ([place]) 12/23/2019   Atrial tachycardia ([place]) 08/12/2019   Benign essential HTN    Chest pain 08/23/2019   Chronic anticoagulation 12/26/2019   Chronic heart failure with preserved EF 05/30/2019   Echo 07/2019: EF 55-60,  GR 1 DD   Chronic pain syndrome    Cirrhosis of liver without ascites (HCC)    Class 2 severe obesity due to excess calories with serious comorbidity and body mass index (BMI) of 39.0 to 39.9 in adult ([place]) 05/30/2019   Debility 03/13/2019   Drug induced constipation    Dyspnea    Fatty liver disease, nonalcoholic    GIB (gastrointestinal bleeding) 03/04/2019   Heart failure, systolic, acute ([place]) 5/46/5681   History of pulmonary embolism 08/12/2019   Hyperlipidemia 01/24/2019   Hypertension 01/24/2019   Hypoalbuminemia due to protein-calorie malnutrition (HCC)    Hypokalemia    Hyponatremia    Hypotension due to drugs    Insomnia 01/24/2019   Lactic acidosis 08/23/2019   Lumbar disc herniation 01/24/2019   Mixed hyperlipidemia 01/24/2019   Morbid obesity with BMI of 40.0-44.9, adult ([place]) 05/30/2019   Normocytic anemia 06/08/2019   Occasional tremors 08/12/2019   OSA (obstructive sleep apnea) 08/12/2019   Palpitations 12/26/2019   Peripheral edema    Personal history of DVT (deep vein thrombosis) 12/26/2019   Portal hypertensive gastropathy ([place]) 03/06/2019   Seen on EGD 03/06/2019   Pulmonary emboli ([place]) 06/08/2019   Pulmonary embolism ([place]) 06/08/2019   Redness and swelling of lower leg 08/23/2019   Shock ([place])    Thrombocytopenia ([place])     Social History   Tobacco Use   Smoking status: Never   Smokeless tobacco: Never  Vaping Use   Vaping Use: Never used  Substance Use Topics   Alcohol use: Not Currently   Drug use: Not Currently    Types: Marijuana    Comment: Smoked for 30 years    Family History  Problem Relation Age of Onset   Hyperlipidemia Mother    Hypertension Mother    Stroke Mother    CAD Maternal Grandmother    CAD Maternal Grandfather     Allergies  Allergen Reactions   Penicillins     Broke out in convulsions as a child  Did it involve swelling of the face/tongue/throat, SOB, or low BP? N/A Did it involve sudden or severe rash/hives, skin peeling,  or any reaction on the inside of your mouth or nose? N/A Did you need to seek medical attention at a hospital or doctor's office?N/A When did it last happen? N/A If all above answers are "NO", may proceed with cephalosporin use.    OBJECTIVE: Vitals:   02/06/21 1009  BP: 134/75  Pulse: 79  SpO2: 96%   There is no height or weight on file to calculate BMI.   Physical Exam General/constitutional: obese; in wheel chair; mild distress complaining of left UE numbness/tingling, pleasant HEENT: Normocephalic, PER, Conj Clear, EOMI, Oropharynx clear Neck supple CV: rrr no mrg Lungs: clear to auscultation, normal respiratory effort Abd: Soft, Nontender Ext: bilateral 2+ pitting/brawny edema  Skin: hyperpigmentation change of edema bilateral LE and some scattered eschar; minimal tenderness and warmth Neuro: 4-5/5 bilateral LE strength otherwise nonfocal MSK: no peripheral joint swelling/tenderness/warmth; back spines nontender Psych alert/oriented     Lab: Lab Results  Component  Value Date   WBC 8.0 12/22/2020   HGB 10.9 (L) 12/22/2020   HCT 35.8 (L) 12/22/2020   MCV 78.5 (L) 12/22/2020   PLT 155 36/46/8032   Last metabolic panel Lab Results  Component Value Date   GLUCOSE 128 (H) 02/05/2021   NA 134 (L) 02/05/2021   K 3.9 02/05/2021   CL 97 (L) 02/05/2021   CO2 29 02/05/2021   BUN 10 02/05/2021   CREATININE 0.92 02/05/2021   GFRNONAA >60 02/05/2021   CALCIUM 9.1 02/05/2021   PHOS 3.8 10/29/2020   PROT 7.1 12/21/2020   ALBUMIN 3.2 (L) 12/21/2020   BILITOT 1.2 12/21/2020   ALKPHOS 71 12/21/2020   AST 28 12/21/2020   ALT 17 12/21/2020   ANIONGAP 8 02/05/2021    Microbiology:  Serology:  Imaging:   Assessment/plan: Problem List Items Addressed This Visit       Other   Chest pain - Primary   Other Visit Diagnoses     Venous stasis dermatitis of both lower extremities           He has chronic venous stasis and dermatitis due to that. No evidence of  cellulitis Agree lymphedema clinic is way to go for treatment  Rx for triamcinolone ointment for dermatitis given as well Follow with pcp No need to follow up id clinic   Chest pain/lue numbness concerning for cardiac event vs neurologic event. He does have risk for coronary event. Aspirin given. Referral to ed which he is agreeable to finally after 10-15 minute refusal. Will be pushed on WC over there by our nursing staff. I called his son prior to his agreement to go in hope to have his son convince him, not able to get in touch-- left message to call me back on my cell for update         Follow-up: Return if symptoms worsen or fail to improve.  Jabier Mutton, [place] for [facility] [phone] pager   [phone] cell 02/06/2021, 10:14 AM

## 2021-02-06 NOTE — Patient Instructions (Addendum)
You don't have cellulitis  This is due to chronic swelling/fluid in the legs causing irritation   Follow with your lymphedema clinic. In the mean time, I have prescribed antiinflammatory ointment which you can put on twice a day for the next week or two for your symptoms.  Ultimately compression wrap to prevent accumulation of fluid in the legs is your treatment   You have symptoms chest pain/numbness of your arm concerning for heart attack or stroke. We have called ED to expect your arrival but you have refused. This could result in death. Please reconsider going to ed if symptoms persist.   We have given you aspirin and a sublingual nitroglycerin today. Do not take more aspirin. Please see your regular doctor within the next few days otherwise

## 2021-02-06 NOTE — ED Provider Notes (Signed)
[facility] EMERGENCY DEPARTMENT Provider Note  CSN: [id number] Arrival date & time: 02/06/21 1031    History Chief Complaint  Patient presents with   Chest Pain   Arm Pain    Charles Gray is a 65 y.o. male with history of CHF, no known CAD reports he has had several episodes of sharp, fleeting L lower chest pains since last night. Comes and goes, lasts a few seconds at a time. No provoking or relieving factors. He has also had some tingling in his L hand that he has to shake to make it go away. This does not seem to correlate with his episodes of chest pains however. He was at the ID clinic for follow up today and was referred to the ED after he told them about his chest pains. He is compliant with his medications including diuretics and anticoagulants. No change in chronic SOB or leg swelling.    Past Medical History:  Diagnosis Date   Acute on chronic congestive heart failure (HCC)    Acute on chronic diastolic CHF (congestive heart failure) ([place]) 08/23/2019   Acute on chronic right-sided heart failure ([place]) 08/06/2019   Acute respiratory failure with hypoxia (HCC)    Anemia of chronic disease    Atrial fibrillation ([place]) 12/23/2019   Atrial tachycardia ([place]) 08/12/2019   Benign essential HTN    Chest pain 08/23/2019   Chronic anticoagulation 12/26/2019   Chronic heart failure with preserved EF 05/30/2019   Echo 07/2019: EF 55-60, GR 1 DD   Chronic pain syndrome    Cirrhosis of liver without ascites ([place])    Class 2 severe obesity due to excess calories with serious comorbidity and body mass index (BMI) of 39.0 to 39.9 in adult ([place]) 05/30/2019   Debility 03/13/2019   Drug induced constipation    Dyspnea    Fatty liver disease, nonalcoholic    GIB (gastrointestinal bleeding) 03/04/2019   Heart failure, systolic, acute ([place]) 0/35/4656   History of pulmonary embolism 08/12/2019   Hyperlipidemia 01/24/2019   Hypertension 01/24/2019   Hypoalbuminemia due to protein-calorie  malnutrition (HCC)    Hypokalemia    Hyponatremia    Hypotension due to drugs    Insomnia 01/24/2019   Lactic acidosis 08/23/2019   Lumbar disc herniation 01/24/2019   Mixed hyperlipidemia 01/24/2019   Morbid obesity with BMI of 40.0-44.9, adult ([place]) 05/30/2019   Normocytic anemia 06/08/2019   Occasional tremors 08/12/2019   OSA (obstructive sleep apnea) 08/12/2019   Palpitations 12/26/2019   Peripheral edema    Personal history of DVT (deep vein thrombosis) 12/26/2019   Portal hypertensive gastropathy ([place]) 03/06/2019   Seen on EGD 03/06/2019   Pulmonary emboli ([place]) 06/08/2019   Pulmonary embolism ([place]) 06/08/2019   Redness and swelling of lower leg 08/23/2019   Shock ([place])    Thrombocytopenia ([place])     Past Surgical History:  Procedure Laterality Date   ESOPHAGOGASTRODUODENOSCOPY (EGD) WITH PROPOFOL N/A 03/05/2019   Procedure: ESOPHAGOGASTRODUODENOSCOPY (EGD) WITH PROPOFOL;  Surgeon: Doran Stabler, MD;  Location: [facility];  Service: Gastroenterology;  Laterality: N/A;  Large Blood Clot Removed   ESOPHAGOGASTRODUODENOSCOPY (EGD) WITH PROPOFOL N/A 03/06/2019   Procedure: ESOPHAGOGASTRODUODENOSCOPY (EGD) WITH PROPOFOL;  Surgeon: Thornton Park, MD;  Location: [facility];  Service: Gastroenterology;  Laterality: N/A;   HEMORROIDECTOMY     PRESSURE SENSOR/CARDIOMEMS N/A 11/14/2020   Procedure: PRESSURE SENSOR/CARDIOMEMS;  Surgeon: Larey Dresser, MD;  Location: [facility];  Service: Cardiovascular;  Laterality: N/A;  RIGHT HEART CATH N/A 08/09/2019   Procedure: RIGHT HEART CATH;  Surgeon: Larey Dresser, MD;  Location: [facility];  Service: Cardiovascular;  Laterality: N/A;   RIGHT HEART CATH N/A 11/01/2020   Procedure: RIGHT HEART CATH;  Surgeon: Larey Dresser, MD;  Location: [facility];  Service: Cardiovascular;  Laterality: N/A;   RIGHT HEART CATH N/A 11/14/2020   Procedure: RIGHT HEART CATH;  Surgeon: Larey Dresser, MD;  Location: [facility];  Service: Cardiovascular;  Laterality: N/A;   RIGHT/LEFT HEART CATH AND CORONARY ANGIOGRAPHY N/A 05/10/2020   Procedure: RIGHT/LEFT HEART CATH AND CORONARY ANGIOGRAPHY;  Surgeon: Larey Dresser, MD;  Location: [facility];  Service: Cardiovascular;  Laterality: N/A;    Family History  Problem Relation Age of Onset   Hyperlipidemia Mother    Hypertension Mother    Stroke Mother    CAD Maternal Grandmother    CAD Maternal Grandfather     Social History   Tobacco Use   Smoking status: Never   Smokeless tobacco: Never  Vaping Use   Vaping Use: Never used  Substance Use Topics   Alcohol use: Not Currently   Drug use: Not Currently    Types: Marijuana    Comment: Smoked for 30 years     Home Medications Prior to Admission medications   Medication Sig Start Date End Date Taking? Authorizing Provider  acetaminophen (TYLENOL) 500 MG tablet Take 1,000 mg by mouth every 6 (six) hours as needed for moderate pain or headache.    [provider]  apixaban (ELIQUIS) 5 MG TABS tablet Take 1 tablet (5 mg total) by mouth 2 (two) times daily. 11/15/20   Joette Catching, PA-C  Buprenorphine HCl-Naloxone HCl 8-2 MG FILM Place 1 tablet under the tongue 2 (two) times daily.    [provider]  eplerenone (INSPRA) 50 MG tablet Take 2 tablets (100 mg total) by mouth daily. 11/12/20   Clegg, [name] D, NP  gabapentin (NEURONTIN) 800 MG tablet Take 1 tablet (800 mg total) by mouth 3 (three) times daily. 03/23/19   Angiulli, Lavon Paganini, PA-C  lactulose (CHRONULAC) 10 GM/15ML solution Take 15 mLs (10 g total) by mouth 3 (three) times daily. 12/23/20   Shelly Coss, MD  metolazone (ZAROXOLYN) 2.5 MG tablet Take 1 tablet (2.5 mg total) by mouth 3 (three) times a week. Monday, Wednesday and Fridays 11/15/20   Joette Catching, PA-C  metoprolol succinate (TOPROL XL) 25 MG 24 hr tablet Take 1 tablet (25 mg total) by mouth 2 (two) times daily. 11/02/20 02/05/22  Alma Friendly, MD  ondansetron (ZOFRAN-ODT) 4 MG disintegrating tablet TAKE 1 TABLET BY MOUTH EVERY 8 HOURS AS NEEDED FOR NAUSEA OR VOMITING 01/07/21   Larey Dresser, MD  pantoprazole (PROTONIX) 40 MG tablet Take 40 mg by mouth 2 (two) times daily.    [provider]  polyethylene glycol (MIRALAX / GLYCOLAX) 17 g packet Take 17 g by mouth as needed.    [provider]  potassium chloride SA (KLOR-CON) 20 MEQ tablet Take 4 tablets (80 mEq total) by mouth 2 (two) times daily. Extra 40 mEq with metolazone days. 11/15/20   Joette Catching, PA-C  pravastatin (PRAVACHOL) 20 MG tablet Take 1 tablet (20 mg total) by mouth daily. 11/15/20   Joette Catching, PA-C  sertraline (ZOLOFT) 50 MG tablet Take 1 tablet (50 mg total) by mouth daily. 02/05/21   Rafael Bihari,  FNP  sodium chloride (OCEAN) 0.65 % SOLN nasal spray Place 1 spray into both nostrils as needed for congestion.    [provider]  tamsulosin (FLOMAX) 0.4 MG CAPS capsule Take 1 capsule (0.4 mg total) by mouth daily. 11/15/20   Joette Catching, PA-C  torsemide (DEMADEX) 100 MG tablet Take 1 tablet (100 mg total) by mouth 2 (two) times daily. 09/26/20   Danford, Suann Larry, MD  triamcinolone (KENALOG) 0.025 % ointment Apply 1 application topically 2 (two) times daily. 02/06/21   Vu, Rockey Situ, MD  VENTOLIN HFA 108 (90 Base) MCG/ACT inhaler Inhale 1 puff into the lungs every 4 (four) hours as needed for shortness of breath. 03/23/19   Angiulli, Lavon Paganini, PA-C  zolpidem (AMBIEN) 10 MG tablet Take 1 tablet (10 mg total) by mouth at bedtime as needed for sleep. 12/23/20   Shelly Coss, MD  bumetanide (BUMEX) 1 MG tablet Take 4 tablets (4 mg total) by mouth daily with breakfast AND 3 tablets (3 mg total) every evening. 05/10/20 06/13/20  Larey Dresser, MD  metoprolol tartrate (LOPRESSOR) 50 MG tablet Take 1 tablet (50 mg total) by mouth 2 (two) times daily. 02/09/20 04/11/20  Mercy Riding, MD     Allergies     Penicillins   Review of Systems   Review of Systems A comprehensive review of systems was completed and negative except as noted in HPI.    Physical Exam BP 116/73   Pulse 60   Temp 97.8 F (36.6 C) (Oral)   Resp 15   SpO2 98%   Physical Exam Vitals and nursing note reviewed.  Constitutional:      Appearance: Normal appearance.  HENT:     Head: Normocephalic and atraumatic.     Nose: Nose normal.     Mouth/Throat:     Mouth: Mucous membranes are moist.  Eyes:     Extraocular Movements: Extraocular movements intact.     Conjunctiva/sclera: Conjunctivae normal.  Cardiovascular:     Rate and Rhythm: Normal rate.  Pulmonary:     Effort: Pulmonary effort is normal.     Breath sounds: Normal breath sounds.  Chest:     Chest wall: Tenderness (L lower chest, reproduces pain) present.  Abdominal:     General: Abdomen is flat.     Palpations: Abdomen is soft.     Tenderness: There is no abdominal tenderness.  Musculoskeletal:        General: Tenderness (L shoulder/paraspinal muscles) present. No swelling. Normal range of motion.     Cervical back: Neck supple.  Skin:    General: Skin is warm and dry.  Neurological:     General: No focal deficit present.     Mental Status: He is alert.     Cranial Nerves: No cranial nerve deficit.     Sensory: No sensory deficit.     Motor: No weakness.  Psychiatric:        Mood and Affect: Mood normal.     ED Results / Procedures / Treatments   Labs (all labs ordered are listed, but only abnormal results are displayed) Labs Reviewed  BASIC METABOLIC PANEL - Abnormal; Notable for the following components:      Result Value   Sodium 129 (*)    Potassium 3.0 (*)    Chloride 85 (*)    Glucose, Bld 137 (*)    All other components within normal limits  CBC - Abnormal; Notable for the following components:  Hemoglobin 11.7 (*)    HCT 38.2 (*)    MCV 76.6 (*)    MCH 23.4 (*)    RDW 16.3 (*)    All other components within  normal limits  TROPONIN I (HIGH SENSITIVITY)  TROPONIN I (HIGH SENSITIVITY)    EKG EKG Interpretation  Date/Time:  Thursday February 06 2021 10:43:34 EDT Ventricular Rate:  78 PR Interval:  156 QRS Duration: 108 QT Interval:  426 QTC Calculation: 485 R Axis:   -50 Text Interpretation: Sinus rhythm with marked sinus arrhythmia Left anterior fascicular block Prolonged QT Abnormal ECG overall similar to Sept 2022 Confirmed by Sherwood Gambler [id number]) on 02/06/2021 2:10:09 PM  Radiology DG Chest 2 View  Result Date: 02/06/2021 CLINICAL DATA:  Chest pain EXAM: CHEST - 2 VIEW COMPARISON:  Chest x-ray 12/21/2020 FINDINGS: Heart is mildly enlarged. Mediastinum appears stable. No focal consolidation identified. No pleural effusion or pneumothorax. IMPRESSION: Mild cardiomegaly without active cardiopulmonary disease. Electronically Signed   By: Ofilia Neas M.D.   On: 02/06/2021 11:09    Procedures Procedures  Medications Ordered in the ED Medications  potassium chloride SA (KLOR-CON) CR tablet 40 mEq (40 mEq Oral Given 02/06/21 1553)     MDM Rules/Calculators/A&P MDM Patient with atypical, fleeting and reproducible chest pains with paresthesias of LUE likely cervical radiculopathy. EKG is unchanged from baseline. BMP shows mild hyponatremia and hypokalemia, will replete K orally. CBC is unremarkable. First trop is neg. Second trop is pending. CXR with chronic cardiomegaly, no acute pulm edema.  ED Course  I have reviewed the triage vital signs and the nursing notes.  Pertinent labs & imaging results that were available during my care of the patient were reviewed by me and considered in my medical decision making (see chart for details).  Clinical Course as of 02/06/21 1642  Thu Feb 06, 2021  1625 Repeat Trop is neg. Patient without symptoms concerning for ACS. Labs otherwise at baseline. He is eager to go home. Recommend continued outpatient follow up.  [CS]    Clinical Course  User Index [CS] Truddie Hidden, MD    Final Clinical Impression(s) / ED Diagnoses Final diagnoses:  Atypical chest pain    Rx / DC Orders ED Discharge Orders     None        Truddie Hidden, MD 02/06/21 1643

## 2021-02-06 NOTE — Telephone Encounter (Signed)
Charles Gray informed me he was being seen in ER for chest pain, I will follow up following d/c.

## 2021-02-06 NOTE — Addendum Note (Signed)
Addended by: Carlean Purl on: 02/06/2021 10:55 AM   Modules accepted: Orders

## 2021-02-06 NOTE — Progress Notes (Signed)
Patient arrived to clinic complaining of chest pain that started last night and this morning he started having left hand numbness and tingling. Patient reports seeing his cardiology team yesterday morning however the pain/numbness didn't start until last night. RN expressed concern for a possible MI and recommended taking him to the ED. Patient refused to go initially but after much convincing he was amiable to go for an assessment. Patient was given 321m of aspirin around 10:15. Patient began to have nausea and dry heaving before nitro could be given. Patient repeatedly stated he didn't want to go to the ED but RN insisted he go until he finally complied. RN brought patient over to ED with assistance for assessment. Provider notified patient's son and patient was instructed to let him know where he was.   Shakeyla Giebler RLorita Officer RN

## 2021-02-11 ENCOUNTER — Other Ambulatory Visit (HOSPITAL_COMMUNITY): Payer: Self-pay

## 2021-02-11 NOTE — Progress Notes (Signed)
Paramedicine Encounter    Patient ID: Charles Gray, male    DOB: 04-20-1955, 65 y.o.   MRN: [id number]   Patient Care Team: Enid Skeens., MD as PCP - General (Family Medicine) Berniece Salines, DO as PCP - Cardiology (Cardiology) Jorge Ny, LCSW as Social Worker (Licensed Clinical Social Worker)  Patient Active Problem List   Diagnosis Date Noted   Acute encephalopathy 12/21/2020   COPD (chronic obstructive pulmonary disease) ([place]) 12/21/2020   Chronic diastolic (congestive) heart failure ([place]) 11/14/2020   Syncope 10/28/2020   CHF (congestive heart failure) ([place]) 10/28/2020   Chronic venous stasis dermatitis of both lower extremities 08/27/2020   Cellulitis of both lower extremities 08/14/2020   Microcytic anemia 08/14/2020   GERD without esophagitis 06/10/2020   Atrial fibrillation with rapid ventricular response ([place]) 03/24/2020   Acute on chronic right heart failure ([place]) 01/27/2020   Acute respiratory failure with hypoxia ([place])    Fatty liver disease, nonalcoholic    Shock ([place])    Personal history of DVT (deep vein thrombosis) 12/26/2019   Chronic anticoagulation 12/26/2019   Palpitations 12/26/2019   AF (paroxysmal atrial fibrillation) ([place]) 12/23/2019   Heart failure, systolic, acute ([place]) 46/65/9935   Acute on chronic congestive heart failure (HCC)    Chronic diastolic HF (heart failure) ([place]) 08/23/2019   Chest pain 08/23/2019   Lactic acidosis 08/23/2019   History of pulmonary embolism 08/12/2019   Atrial tachycardia ([place]) 08/12/2019   Occasional tremors 08/12/2019   OSA (obstructive sleep apnea) 08/12/2019   Acute on chronic right-sided heart failure ([place]) 08/06/2019   Pulmonary embolism ([place]) 06/08/2019   Normocytic anemia 06/08/2019   Pulmonary emboli ([place]) 06/08/2019   Chronic heart failure with preserved ejection fraction ([place]) 05/30/2019   Class 2 severe obesity due to excess calories with serious comorbidity and body mass index (BMI) of 39.0 to  39.9 in adult ([place]) 05/30/2019   Morbid obesity with BMI of 40.0-44.9, adult ([place]) 05/30/2019   Hyponatremia    Drug induced constipation    Peripheral edema    Hypotension due to drugs    Hypoalbuminemia due to protein-calorie malnutrition (HCC)    Hypokalemia    Thrombocytopenia (HCC)    Anemia of chronic disease    Cirrhosis of liver without ascites (HCC)    Chronic pain syndrome    Debility 03/13/2019   Portal hypertensive gastropathy ([place]) 03/06/2019   Dyspnea    GIB (gastrointestinal bleeding) 03/04/2019   Mixed hyperlipidemia 01/24/2019   Insomnia 01/24/2019   Hyperlipidemia 01/24/2019   Essential hypertension 01/24/2019   Lumbar disc herniation 01/24/2019    Current Outpatient Medications:    acetaminophen (TYLENOL) 500 MG tablet, Take 1,000 mg by mouth every 6 (six) hours as needed for moderate pain or headache., Disp: , Rfl:    apixaban (ELIQUIS) 5 MG TABS tablet, Take 1 tablet (5 mg total) by mouth 2 (two) times daily., Disp: 60 tablet, Rfl: 0   Buprenorphine HCl-Naloxone HCl 8-2 MG FILM, Place 1 tablet under the tongue 2 (two) times daily., Disp: , Rfl:    eplerenone (INSPRA) 50 MG tablet, Take 2 tablets (100 mg total) by mouth daily., Disp: 60 tablet, Rfl: 3   gabapentin (NEURONTIN) 800 MG tablet, Take 1 tablet (800 mg total) by mouth 3 (three) times daily., Disp: 90 tablet, Rfl: 1   lactulose (CHRONULAC) 10 GM/15ML solution, Take 15 mLs (10 g total) by mouth 3 (three) times daily., Disp: 946 mL, Rfl: 1   metolazone (ZAROXOLYN) 2.5 MG tablet,  Take 1 tablet (2.5 mg total) by mouth 3 (three) times a week. Monday, Wednesday and Fridays, Disp: 14 tablet, Rfl: 0   metoprolol succinate (TOPROL XL) 25 MG 24 hr tablet, Take 1 tablet (25 mg total) by mouth 2 (two) times daily., Disp: 60 tablet, Rfl: 2   ondansetron (ZOFRAN-ODT) 4 MG disintegrating tablet, TAKE 1 TABLET BY MOUTH EVERY 8 HOURS AS NEEDED FOR NAUSEA OR VOMITING, Disp: 20 tablet, Rfl: 2   pantoprazole (PROTONIX) 40 MG  tablet, Take 40 mg by mouth 2 (two) times daily., Disp: , Rfl:    polyethylene glycol (MIRALAX / GLYCOLAX) 17 g packet, Take 17 g by mouth as needed., Disp: , Rfl:    potassium chloride SA (KLOR-CON) 20 MEQ tablet, Take 4 tablets (80 mEq total) by mouth 2 (two) times daily. Extra 40 mEq with metolazone days., Disp: 260 tablet, Rfl: 0   pravastatin (PRAVACHOL) 20 MG tablet, Take 1 tablet (20 mg total) by mouth daily., Disp: 30 tablet, Rfl: 1   sertraline (ZOLOFT) 50 MG tablet, Take 1 tablet (50 mg total) by mouth daily., Disp: 30 tablet, Rfl: 3   sodium chloride (OCEAN) 0.65 % SOLN nasal spray, Place 1 spray into both nostrils as needed for congestion., Disp: , Rfl:    tamsulosin (FLOMAX) 0.4 MG CAPS capsule, Take 1 capsule (0.4 mg total) by mouth daily., Disp: 30 capsule, Rfl: 0   torsemide (DEMADEX) 100 MG tablet, Take 1 tablet (100 mg total) by mouth 2 (two) times daily., Disp: 60 tablet, Rfl: 3   triamcinolone (KENALOG) 0.025 % ointment, Apply 1 application topically 2 (two) times daily., Disp: 30 g, Rfl: 3   VENTOLIN HFA 108 (90 Base) MCG/ACT inhaler, Inhale 1 puff into the lungs every 4 (four) hours as needed for shortness of breath., Disp: 6.7 g, Rfl: 1   zolpidem (AMBIEN) 10 MG tablet, Take 1 tablet (10 mg total) by mouth at bedtime as needed for sleep., Disp: 30 tablet, Rfl: 0  Current Facility-Administered Medications:    aspirin tablet 325 mg, 325 mg, Oral, Daily, Vu, Trung T, MD, 325 mg at 02/06/21 1013 Allergies  Allergen Reactions   Penicillins     Broke out in convulsions as a child  Did it involve swelling of the face/tongue/throat, SOB, or low BP? N/A Did it involve sudden or severe rash/hives, skin peeling, or any reaction on the inside of your mouth or nose? N/A Did you need to seek medical attention at a hospital or doctor's office?N/A When did it last happen? N/A If all above answers are "NO", may proceed with cephalosporin use.     Social History   Socioeconomic  History   Marital status: Single    Spouse name: Not on file   Number of children: Not on file   Years of education: Not on file   Highest education level: Not on file  Occupational History   Not on file  Tobacco Use   Smoking status: Never   Smokeless tobacco: Never  Vaping Use   Vaping Use: Never used  Substance and Sexual Activity   Alcohol use: Not Currently   Drug use: Not Currently    Types: Marijuana    Comment: Smoked for 30 years   Sexual activity: Not on file  Other Topics Concern   Not on file  Social History Narrative   Not on file   Social Determinants of Health   Financial Resource Strain: Medium Risk   Difficulty of Paying Living Expenses: Somewhat hard  Food Insecurity: No Food Insecurity   Worried About Charity fundraiser in the Last Year: Never true   Ran Out of Food in the Last Year: Never true  Transportation Needs: Unmet Transportation Needs   Lack of Transportation (Medical): Yes   Lack of Transportation (Non-Medical): No  Physical Activity: Not on file  Stress: Not on file  Social Connections: Not on file  Intimate Partner Violence: Not on file    Physical Exam Vitals reviewed.  Constitutional:      Appearance: Normal appearance. He is normal weight.  HENT:     Head: Normocephalic.     Nose: Nose normal. No congestion.     Mouth/Throat:     Mouth: Mucous membranes are moist.     Pharynx: Oropharynx is clear.  Eyes:     Conjunctiva/sclera: Conjunctivae normal.     Pupils: Pupils are equal, round, and reactive to light.  Cardiovascular:     Rate and Rhythm: Normal rate and regular rhythm.     Pulses: Normal pulses.     Heart sounds: Normal heart sounds.  Pulmonary:     Effort: Pulmonary effort is normal. No respiratory distress.     Breath sounds: Normal breath sounds. No wheezing.  Abdominal:     General: There is distension.     Palpations: Abdomen is soft.  Musculoskeletal:        General: Normal range of motion.     Cervical  back: Normal range of motion.     Comments: Both legs wrapped with UNNA BOOTS   Skin:    General: Skin is warm and dry.     Capillary Refill: Capillary refill takes less than 2 seconds.  Neurological:     General: No focal deficit present.     Mental Status: He is alert. Mental status is at baseline.  Psychiatric:        Mood and Affect: Mood normal.   Arrived for home visit for Yi who reports feeling okay other than having some constipation with a swollen stomach. He reports having a small bowel movement yesterday with small, hard output. He states he has been compliant with his medications but states he took his last dose of Lactulose today. Bridger denied chest pain, dizziness. Reports some shortness of breath while walking.  Lungs clear.   Vitals and assessment obtained.  Giovanne stated he missed Metolazone yesterday but took it today with his morning medications. Unna boots in place so I was unable to assess swelling but he reports the [employer] are helping.   We reviewed medications and I filled one pill box accordingly. We reviewed upcoming appointments and confirmed same.   Courtenay received at at home A1C test provided by his insurance so I assisted with same. Results will be mailed to him in 2-4 weeks.   Rigel inquired if 11/23 appointment could be moved to an earlier time or an earlier date. I will inquire.   Home visit complete.   Refills: Lactulose          Future Appointments  Date Time Provider [place]  03/05/2021  2:00 PM [facility] PA/NP [facility] None  03/25/2021  9:00 AM Ricard Dillon, MD [facility]  05/16/2021 11:20 AM Larey Dresser, MD [facility] None     ACTION: Home visit completed

## 2021-02-18 ENCOUNTER — Other Ambulatory Visit (HOSPITAL_COMMUNITY): Payer: Self-pay

## 2021-02-18 NOTE — Progress Notes (Signed)
Paramedicine Encounter    Patient ID: Charles Gray, male    DOB: [birth year], 65 y.o.   MRN: [id number]   Patient Care Team: Enid Skeens., MD as PCP - General (Family Medicine) Berniece Salines, DO as PCP - Cardiology (Cardiology) Jorge Ny, LCSW as Social Worker (Licensed Clinical Social Worker)  Patient Active Problem List   Diagnosis Date Noted   Acute encephalopathy 12/21/2020   COPD (chronic obstructive pulmonary disease) ([place]) 12/21/2020   Chronic diastolic (congestive) heart failure ([place]) 11/14/2020   Syncope 10/28/2020   CHF (congestive heart failure) ([place]) 10/28/2020   Chronic venous stasis dermatitis of both lower extremities 08/27/2020   Cellulitis of both lower extremities 08/14/2020   Microcytic anemia 08/14/2020   GERD without esophagitis 06/10/2020   Atrial fibrillation with rapid ventricular response ([place]) 03/24/2020   Acute on chronic right heart failure ([place]) 01/27/2020   Acute respiratory failure with hypoxia ([place])    Fatty liver disease, nonalcoholic    Shock ([place])    Personal history of DVT (deep vein thrombosis) 12/26/2019   Chronic anticoagulation 12/26/2019   Palpitations 12/26/2019   AF (paroxysmal atrial fibrillation) ([place]) 12/23/2019   Heart failure, systolic, acute ([place]) 78/58/8502   Acute on chronic congestive heart failure (HCC)    Chronic diastolic HF (heart failure) ([place]) 08/23/2019   Chest pain 08/23/2019   Lactic acidosis 08/23/2019   History of pulmonary embolism 08/12/2019   Atrial tachycardia ([place]) 08/12/2019   Occasional tremors 08/12/2019   OSA (obstructive sleep apnea) 08/12/2019   Acute on chronic right-sided heart failure ([place]) 08/06/2019   Pulmonary embolism ([place]) 06/08/2019   Normocytic anemia 06/08/2019   Pulmonary emboli ([place]) 06/08/2019   Chronic heart failure with preserved ejection fraction ([place]) 05/30/2019   Class 2 severe obesity due to excess calories with serious comorbidity and body mass index (BMI) of 39.0 to  39.9 in adult ([place]) 05/30/2019   Morbid obesity with BMI of 40.0-44.9, adult ([place]) 05/30/2019   Hyponatremia    Drug induced constipation    Peripheral edema    Hypotension due to drugs    Hypoalbuminemia due to protein-calorie malnutrition (HCC)    Hypokalemia    Thrombocytopenia (HCC)    Anemia of chronic disease    Cirrhosis of liver without ascites (HCC)    Chronic pain syndrome    Debility 03/13/2019   Portal hypertensive gastropathy ([place]) 03/06/2019   Dyspnea    GIB (gastrointestinal bleeding) 03/04/2019   Mixed hyperlipidemia 01/24/2019   Insomnia 01/24/2019   Hyperlipidemia 01/24/2019   Essential hypertension 01/24/2019   Lumbar disc herniation 01/24/2019    Current Outpatient Medications:    acetaminophen (TYLENOL) 500 MG tablet, Take 1,000 mg by mouth every 6 (six) hours as needed for moderate pain or headache., Disp: , Rfl:    apixaban (ELIQUIS) 5 MG TABS tablet, Take 1 tablet (5 mg total) by mouth 2 (two) times daily., Disp: 60 tablet, Rfl: 0   Buprenorphine HCl-Naloxone HCl 8-2 MG FILM, Place 1 tablet under the tongue 2 (two) times daily., Disp: , Rfl:    eplerenone (INSPRA) 50 MG tablet, Take 2 tablets (100 mg total) by mouth daily., Disp: 60 tablet, Rfl: 3   gabapentin (NEURONTIN) 800 MG tablet, Take 1 tablet (800 mg total) by mouth 3 (three) times daily., Disp: 90 tablet, Rfl: 1   lactulose (CHRONULAC) 10 GM/15ML solution, Take 15 mLs (10 g total) by mouth 3 (three) times daily., Disp: 946 mL, Rfl: 1   metolazone (ZAROXOLYN) 2.5 MG tablet,  Take 1 tablet (2.5 mg total) by mouth 3 (three) times a week. Monday, Wednesday and Fridays, Disp: 14 tablet, Rfl: 0   metoprolol succinate (TOPROL XL) 25 MG 24 hr tablet, Take 1 tablet (25 mg total) by mouth 2 (two) times daily., Disp: 60 tablet, Rfl: 2   ondansetron (ZOFRAN-ODT) 4 MG disintegrating tablet, TAKE 1 TABLET BY MOUTH EVERY 8 HOURS AS NEEDED FOR NAUSEA OR VOMITING, Disp: 20 tablet, Rfl: 2   pantoprazole (PROTONIX) 40 MG  tablet, Take 40 mg by mouth 2 (two) times daily., Disp: , Rfl:    polyethylene glycol (MIRALAX / GLYCOLAX) 17 g packet, Take 17 g by mouth as needed., Disp: , Rfl:    potassium chloride SA (KLOR-CON) 20 MEQ tablet, Take 4 tablets (80 mEq total) by mouth 2 (two) times daily. Extra 40 mEq with metolazone days., Disp: 260 tablet, Rfl: 0   pravastatin (PRAVACHOL) 20 MG tablet, Take 1 tablet (20 mg total) by mouth daily., Disp: 30 tablet, Rfl: 1   sertraline (ZOLOFT) 50 MG tablet, Take 1 tablet (50 mg total) by mouth daily., Disp: 30 tablet, Rfl: 3   sodium chloride (OCEAN) 0.65 % SOLN nasal spray, Place 1 spray into both nostrils as needed for congestion., Disp: , Rfl:    tamsulosin (FLOMAX) 0.4 MG CAPS capsule, Take 1 capsule (0.4 mg total) by mouth daily., Disp: 30 capsule, Rfl: 0   torsemide (DEMADEX) 100 MG tablet, Take 1 tablet (100 mg total) by mouth 2 (two) times daily., Disp: 60 tablet, Rfl: 3   triamcinolone (KENALOG) 0.025 % ointment, Apply 1 application topically 2 (two) times daily., Disp: 30 g, Rfl: 3   VENTOLIN HFA 108 (90 Base) MCG/ACT inhaler, Inhale 1 puff into the lungs every 4 (four) hours as needed for shortness of breath., Disp: 6.7 g, Rfl: 1   zolpidem (AMBIEN) 10 MG tablet, Take 1 tablet (10 mg total) by mouth at bedtime as needed for sleep., Disp: 30 tablet, Rfl: 0  Current Facility-Administered Medications:    aspirin tablet 325 mg, 325 mg, Oral, Daily, Vu, Trung T, MD, 325 mg at 02/06/21 1013 Allergies  Allergen Reactions   Penicillins     Broke out in convulsions as a child  Did it involve swelling of the face/tongue/throat, SOB, or low BP? N/A Did it involve sudden or severe rash/hives, skin peeling, or any reaction on the inside of your mouth or nose? N/A Did you need to seek medical attention at a hospital or doctor's office?N/A When did it last happen? N/A If all above answers are "NO", may proceed with cephalosporin use.     Social History   Socioeconomic  History   Marital status: Single    Spouse name: Not on file   Number of children: Not on file   Years of education: Not on file   Highest education level: Not on file  Occupational History   Not on file  Tobacco Use   Smoking status: Never   Smokeless tobacco: Never  Vaping Use   Vaping Use: Never used  Substance and Sexual Activity   Alcohol use: Not Currently   Drug use: Not Currently    Types: Marijuana    Comment: Smoked for 30 years   Sexual activity: Not on file  Other Topics Concern   Not on file  Social History Narrative   Not on file   Social Determinants of Health   Financial Resource Strain: Medium Risk   Difficulty of Paying Living Expenses: Somewhat hard  Food Insecurity: No Food Insecurity   Worried About Charity fundraiser in the Last Year: Never true   Ran Out of Food in the Last Year: Never true  Transportation Needs: Unmet Transportation Needs   Lack of Transportation (Medical): Yes   Lack of Transportation (Non-Medical): No  Physical Activity: Not on file  Stress: Not on file  Social Connections: Not on file  Intimate Partner Violence: Not on file    Physical Exam Vitals reviewed.  Constitutional:      Appearance: Normal appearance. He is normal weight.  HENT:     Head: Normocephalic.     Nose: Nose normal.     Mouth/Throat:     Mouth: Mucous membranes are moist.     Pharynx: Oropharynx is clear.  Eyes:     Conjunctiva/sclera: Conjunctivae normal.     Pupils: Pupils are equal, round, and reactive to light.  Cardiovascular:     Rate and Rhythm: Normal rate and regular rhythm.     Pulses: Normal pulses.     Heart sounds: Normal heart sounds.  Pulmonary:     Effort: Pulmonary effort is normal.     Breath sounds: Normal breath sounds.  Abdominal:     General: Abdomen is flat.     Palpations: Abdomen is soft.  Musculoskeletal:        General: No swelling. Normal range of motion.     Cervical back: Normal range of motion.     Right  lower leg: No edema.     Left lower leg: No edema.  Skin:    General: Skin is warm and dry.     Capillary Refill: Capillary refill takes less than 2 seconds.  Neurological:     General: No focal deficit present.     Mental Status: He is alert. Mental status is at baseline.  Psychiatric:        Mood and Affect: Mood normal.   Arrived for home visit for Charles Gray who reports feeling okay other than having some abdominal pain from frequent constipation. He is taking lactulose three times daily. I advised him to take his Miralax as needed. He agreed with plan. Vitals and assessment as noted. Medications reviewed and confirmed. Pill box filled accordingly. Charles Gray reports he got his newest set of [employer] on Saturday 11/5. He will be seen by nurse at home this week and will be getting new leg compression socks by them also. He will be getting PT today. Appointments reviewed confirmed. Home visit complete. I will see Charles Gray in one week.   Refills: Inspra Zofran        Future Appointments  Date Time Provider [place]  03/05/2021  9:00 AM [facility] PA/NP [facility] None  03/25/2021  9:00 AM Ricard Dillon, MD [facility]  05/16/2021 11:20 AM Larey Dresser, MD [facility] None     ACTION: Home visit completed

## 2021-02-24 ENCOUNTER — Telehealth (HOSPITAL_COMMUNITY): Payer: Self-pay

## 2021-02-24 NOTE — Telephone Encounter (Signed)
Medications called in for refill at [facility]

## 2021-02-25 ENCOUNTER — Other Ambulatory Visit (HOSPITAL_COMMUNITY): Payer: Self-pay

## 2021-02-25 NOTE — Progress Notes (Signed)
Paramedicine Encounter    Patient ID: Charles Gray, male    DOB: February 15, 1956, 65 y.o.   MRN: [id number]   Patient Care Team: Enid Skeens., MD as PCP - General (Family Medicine) Berniece Salines, DO as PCP - Cardiology (Cardiology) Jorge Ny, LCSW as Social Worker (Licensed Clinical Social Worker)  Patient Active Problem List   Diagnosis Date Noted   Acute encephalopathy 12/21/2020   COPD (chronic obstructive pulmonary disease) ([place]) 12/21/2020   Chronic diastolic (congestive) heart failure ([place]) 11/14/2020   Syncope 10/28/2020   CHF (congestive heart failure) ([place]) 10/28/2020   Chronic venous stasis dermatitis of both lower extremities 08/27/2020   Cellulitis of both lower extremities 08/14/2020   Microcytic anemia 08/14/2020   GERD without esophagitis 06/10/2020   Atrial fibrillation with rapid ventricular response ([place]) 03/24/2020   Acute on chronic right heart failure ([place]) 01/27/2020   Acute respiratory failure with hypoxia ([place])    Fatty liver disease, nonalcoholic    Shock ([place])    Personal history of DVT (deep vein thrombosis) 12/26/2019   Chronic anticoagulation 12/26/2019   Palpitations 12/26/2019   AF (paroxysmal atrial fibrillation) ([place]) 12/23/2019   Heart failure, systolic, acute ([place]) 57/84/6962   Acute on chronic congestive heart failure (HCC)    Chronic diastolic HF (heart failure) ([place]) 08/23/2019   Chest pain 08/23/2019   Lactic acidosis 08/23/2019   History of pulmonary embolism 08/12/2019   Atrial tachycardia ([place]) 08/12/2019   Occasional tremors 08/12/2019   OSA (obstructive sleep apnea) 08/12/2019   Acute on chronic right-sided heart failure ([place]) 08/06/2019   Pulmonary embolism ([place]) 06/08/2019   Normocytic anemia 06/08/2019   Pulmonary emboli ([place]) 06/08/2019   Chronic heart failure with preserved ejection fraction ([place]) 05/30/2019   Class 2 severe obesity due to excess calories with serious comorbidity and body mass index (BMI) of 39.0 to  39.9 in adult ([place]) 05/30/2019   Morbid obesity with BMI of 40.0-44.9, adult ([place]) 05/30/2019   Hyponatremia    Drug induced constipation    Peripheral edema    Hypotension due to drugs    Hypoalbuminemia due to protein-calorie malnutrition (HCC)    Hypokalemia    Thrombocytopenia (HCC)    Anemia of chronic disease    Cirrhosis of liver without ascites (HCC)    Chronic pain syndrome    Debility 03/13/2019   Portal hypertensive gastropathy ([place]) 03/06/2019   Dyspnea    GIB (gastrointestinal bleeding) 03/04/2019   Mixed hyperlipidemia 01/24/2019   Insomnia 01/24/2019   Hyperlipidemia 01/24/2019   Essential hypertension 01/24/2019   Lumbar disc herniation 01/24/2019    Current Outpatient Medications:    acetaminophen (TYLENOL) 500 MG tablet, Take 1,000 mg by mouth every 6 (six) hours as needed for moderate pain or headache., Disp: , Rfl:    apixaban (ELIQUIS) 5 MG TABS tablet, Take 1 tablet (5 mg total) by mouth 2 (two) times daily., Disp: 60 tablet, Rfl: 0   Buprenorphine HCl-Naloxone HCl 8-2 MG FILM, Place 1 tablet under the tongue 2 (two) times daily., Disp: , Rfl:    eplerenone (INSPRA) 50 MG tablet, Take 2 tablets (100 mg total) by mouth daily., Disp: 60 tablet, Rfl: 3   gabapentin (NEURONTIN) 800 MG tablet, Take 1 tablet (800 mg total) by mouth 3 (three) times daily., Disp: 90 tablet, Rfl: 1   lactulose (CHRONULAC) 10 GM/15ML solution, Take 15 mLs (10 g total) by mouth 3 (three) times daily., Disp: 946 mL, Rfl: 1   metolazone (ZAROXOLYN) 2.5 MG tablet,  Take 1 tablet (2.5 mg total) by mouth 3 (three) times a week. Monday, Wednesday and Fridays, Disp: 14 tablet, Rfl: 0   metoprolol succinate (TOPROL XL) 25 MG 24 hr tablet, Take 1 tablet (25 mg total) by mouth 2 (two) times daily., Disp: 60 tablet, Rfl: 2   ondansetron (ZOFRAN-ODT) 4 MG disintegrating tablet, TAKE 1 TABLET BY MOUTH EVERY 8 HOURS AS NEEDED FOR NAUSEA OR VOMITING, Disp: 20 tablet, Rfl: 2   pantoprazole (PROTONIX) 40 MG  tablet, Take 40 mg by mouth 2 (two) times daily., Disp: , Rfl:    polyethylene glycol (MIRALAX / GLYCOLAX) 17 g packet, Take 17 g by mouth as needed., Disp: , Rfl:    potassium chloride SA (KLOR-CON) 20 MEQ tablet, Take 4 tablets (80 mEq total) by mouth 2 (two) times daily. Extra 40 mEq with metolazone days., Disp: 260 tablet, Rfl: 0   pravastatin (PRAVACHOL) 20 MG tablet, Take 1 tablet (20 mg total) by mouth daily., Disp: 30 tablet, Rfl: 1   sertraline (ZOLOFT) 50 MG tablet, Take 1 tablet (50 mg total) by mouth daily., Disp: 30 tablet, Rfl: 3   sodium chloride (OCEAN) 0.65 % SOLN nasal spray, Place 1 spray into both nostrils as needed for congestion., Disp: , Rfl:    tamsulosin (FLOMAX) 0.4 MG CAPS capsule, Take 1 capsule (0.4 mg total) by mouth daily., Disp: 30 capsule, Rfl: 0   torsemide (DEMADEX) 100 MG tablet, Take 1 tablet (100 mg total) by mouth 2 (two) times daily., Disp: 60 tablet, Rfl: 3   triamcinolone (KENALOG) 0.025 % ointment, Apply 1 application topically 2 (two) times daily., Disp: 30 g, Rfl: 3   VENTOLIN HFA 108 (90 Base) MCG/ACT inhaler, Inhale 1 puff into the lungs every 4 (four) hours as needed for shortness of breath., Disp: 6.7 g, Rfl: 1   zolpidem (AMBIEN) 10 MG tablet, Take 1 tablet (10 mg total) by mouth at bedtime as needed for sleep., Disp: 30 tablet, Rfl: 0  Current Facility-Administered Medications:    aspirin tablet 325 mg, 325 mg, Oral, Daily, Vu, Trung T, MD, 325 mg at 02/06/21 1013 Allergies  Allergen Reactions   Penicillins     Broke out in convulsions as a child  Did it involve swelling of the face/tongue/throat, SOB, or low BP? N/A Did it involve sudden or severe rash/hives, skin peeling, or any reaction on the inside of your mouth or nose? N/A Did you need to seek medical attention at a hospital or doctor's office?N/A When did it last happen? N/A If all above answers are "NO", may proceed with cephalosporin use.     Social History   Socioeconomic  History   Marital status: Single    Spouse name: Not on file   Number of children: Not on file   Years of education: Not on file   Highest education level: Not on file  Occupational History   Not on file  Tobacco Use   Smoking status: Never   Smokeless tobacco: Never  Vaping Use   Vaping Use: Never used  Substance and Sexual Activity   Alcohol use: Not Currently   Drug use: Not Currently    Types: Marijuana    Comment: Smoked for 30 years   Sexual activity: Not on file  Other Topics Concern   Not on file  Social History Narrative   Not on file   Social Determinants of Health   Financial Resource Strain: Medium Risk   Difficulty of Paying Living Expenses: Somewhat hard  Food Insecurity: No Food Insecurity   Worried About Charity fundraiser in the Last Year: Never true   Ran Out of Food in the Last Year: Never true  Transportation Needs: Unmet Transportation Needs   Lack of Transportation (Medical): Yes   Lack of Transportation (Non-Medical): No  Physical Activity: Not on file  Stress: Not on file  Social Connections: Not on file  Intimate Partner Violence: Not on file    Physical Exam Vitals reviewed.  Constitutional:      Appearance: Normal appearance. He is normal weight.  HENT:     Head: Normocephalic.     Nose: Nose normal.     Mouth/Throat:     Mouth: Mucous membranes are moist.     Pharynx: Oropharynx is clear.  Eyes:     Conjunctiva/sclera: Conjunctivae normal.     Pupils: Pupils are equal, round, and reactive to light.  Cardiovascular:     Rate and Rhythm: Normal rate and regular rhythm.     Pulses: Normal pulses.     Heart sounds: Normal heart sounds.  Pulmonary:     Effort: Pulmonary effort is normal. No respiratory distress.     Breath sounds: Normal breath sounds. No wheezing, rhonchi or rales.  Abdominal:     General: Abdomen is flat.     Palpations: Abdomen is soft.  Musculoskeletal:        General: Normal range of motion.     Cervical  back: Normal range of motion.     Comments: BOTH LOWER LEGS WRAPPED BY RN ON Saturday 02/22/21.  Skin:    General: Skin is warm and dry.     Capillary Refill: Capillary refill takes less than 2 seconds.  Neurological:     General: No focal deficit present.     Mental Status: He is alert. Mental status is at baseline.  Psychiatric:        Mood and Affect: Mood normal.    Arrived for home visit for Jhovanny who reports to be feeling okay with some mild shortness of breath and intermittent abdominal pain. Diaz has been compliant with medications over the last week. He weighed today 376lbs when he was 372lbs last week. Vitals obtained and assessment as noted. Lower legs wrapped by RN on Sat. 11/12 and will be wrapped again on 11/19. Deloy has appointment next Wednesday in clinic at 0900. I will set up transportation for him. I reviewed medications and filled pill box accordingly. Home visit complete I will see Triton in clinic next Wednesday.   Refills: Eliquis Gabaepentin Flomax      Future Appointments  Date Time Provider [place]  03/05/2021  9:00 AM [facility] PA/NP [facility] None  03/25/2021  9:00 AM Ricard Dillon, MD [facility]  05/16/2021 11:20 AM Larey Dresser, MD [facility] None     ACTION: Home visit completed

## 2021-02-27 NOTE — Progress Notes (Signed)
ADVANCED HF CLINIC NOTE  Primary Care: Enid Skeens., MD Primary Cardiologist: Dr. Berniece Salines, DO  HF Cardiology: Dr. [name]  HPI: 65 y.o. 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    He has had multiple hospitalizations in 2021 for CHF exacerbation, requiring IV Lasix and change in PO regimen from lasix to torsemide. Echo 4/21 showed normal LVEF 55-60% w/ G1DD. RV not well visualized, but felt to have component of RV failure. [place] 4/21 showed normal PA pressure and normal cardiac output. PFTs also completed and c/w mild obstructive airway disease.   In 7/21, he had a coronary CTA.  This showed no coronary calcium but there was a concern for soft plaque in the distal LCx and distal RCA with hemodynamically significant stenosis.  He was managed medically. He has not had any chest pain, but was started on ranolazine after his CTA.  He feels like it makes his "head fuzzy."    Admitted with A/C diastolic heart failure 33/3832. Diuresed with IV lasix and transitioned to bumex 3 mg twice a day. Discharge weight 360 pounds.   He was admitted again in 12/21 with CHF.  Echo in 12/21 showed EF 91-91%, grade 2 diastolic dysfunction, RV poorly visualized.   Admitted 6/60/60 with A/C diastolic heart failure. Diuresed with IV lasix + metolazone. Transitioned to torsemide 60 mg twice a day and metolazone twice a day. D/C weight 364 pounds.   Admitted 5/28//22 with cellulitis. Treated with antibiotics.   Admitted 09/20/20 with  A/C CHF. He diuresed 5 L with IV lasix, weight 371 at discharge.  He was readmitted for A/C CHF 7/22. He was diuresed with IV lasix + metolazone. RHC showed  optimized filling pressures and preserved CO/CI. He was discharged on torsemide 100 mg bid + metolazone M/W/F. Discharge weight 374 lbs.  RHC (8/22) showed normal filling pressures & he underwent Cardiomems  placement.  Admitted 9/10-9/12/22 for acute encephalopathy, felt secondary to polypharmacy. Ambien and gabapentin were briefly held. Ammonia mildly elevated and he was started on lactulose. Mentation returned to baseline and he was discharged home with Home Health PT/OT.  Last seen in for f/u 02/05/21. Appeared slightly volume up. Given extra dose of metolazone. It was felt that most of leg edema likely due to venous insufficiency. Needs better adherence with UNNA boots/compression stockings. Now has home health and paramedicine.   Saw ID for suspected cellulitis. No active infection identified. Referral to lymphedema clinic submitted.   Here today for 4 week f/u.  Paramedicine continues to follow patient in community. Also had aide that visits him daily and has weekly PT session in home. Notes some dyspnea with exertion, may be slightly worse than baseline. Leg edema improved. Using compression stockings daily. Paramedicine has been coaching him on fluid and sodium restriction. Has been adherent with all medicines except occasionally missed pravastatin and potassium.   Cardiomems: PAd 8 on 11/22 (goal 10).  ECG (personally reviewed): SR with sinus arrhythmia, 74 bpm   PMH: 1. HTN 2. OSA 3. Hyperlipidemia 4. Low back pain 5. PE/DVT in 2/21.  - V/Q scan in 6/21 was not suggestive of chronic PE.  6. Nonalcoholic steatohepatitis.  7. GI bleed in 10/20.  8. Anxiety 9. CAD: Coronary CTA in 7/21 with calcium score 0 but concern for soft plaque distal CFX and RCA.  By [facility], there was flow-limiting stenosis in distal CFX and distal RCA.  10. Chronic  diastolic CHF: Echo in 2/24 with EF 55-60%, RV poorly visualized.  - RHC (4/21): mean RA 8, PA 20/8, mean PCWP 11, CI 2.8 - Echo (12/21): EF 82-50%, grade 2 diastolic dysfunction, RV poorly visualized.   - RHC (8/22) normal filling pressures, RA mean 7, RV 29/9, PA 32/15, mean 22, PCWP mean 13, CO/CI 7.32/2.41. - Cardiomems (8/22) placement. 11.  PFTs (4/21): Mild obstruction.  12. ABIs (10/21): Normal.  Review of Systems: All systems reviewed and negative except as per HPI.   Current Outpatient Medications  Medication Sig Dispense Refill   acetaminophen (TYLENOL) 500 MG tablet Take 1,000 mg by mouth every 6 (six) hours as needed for moderate pain or headache.     apixaban (ELIQUIS) 5 MG TABS tablet Take 1 tablet (5 mg total) by mouth 2 (two) times daily. 60 tablet 0   Buprenorphine HCl-Naloxone HCl 8-2 MG FILM Place 1 tablet under the tongue 2 (two) times daily.     eplerenone (INSPRA) 50 MG tablet Take 2 tablets (100 mg total) by mouth daily. 60 tablet 3   gabapentin (NEURONTIN) 800 MG tablet Take 1 tablet (800 mg total) by mouth 3 (three) times daily. 90 tablet 1   lactulose (CHRONULAC) 10 GM/15ML solution Take 15 mLs (10 g total) by mouth 3 (three) times daily. 946 mL 1   metolazone (ZAROXOLYN) 2.5 MG tablet Take 1 tablet (2.5 mg total) by mouth 3 (three) times a week. Monday, Wednesday and Fridays 14 tablet 0   metoprolol succinate (TOPROL XL) 25 MG 24 hr tablet Take 1 tablet (25 mg total) by mouth 2 (two) times daily. 60 tablet 2   ondansetron (ZOFRAN-ODT) 4 MG disintegrating tablet TAKE 1 TABLET BY MOUTH EVERY 8 HOURS AS NEEDED FOR NAUSEA OR VOMITING 20 tablet 2   pantoprazole (PROTONIX) 40 MG tablet Take 40 mg by mouth 2 (two) times daily.     polyethylene glycol (MIRALAX / GLYCOLAX) 17 g packet Take 17 g by mouth as needed.     potassium chloride SA (KLOR-CON) 20 MEQ tablet Take 4 tablets (80 mEq total) by mouth 2 (two) times daily. Extra 40 mEq with metolazone days. 260 tablet 0   pravastatin (PRAVACHOL) 20 MG tablet Take 1 tablet (20 mg total) by mouth daily. 30 tablet 1   sertraline (ZOLOFT) 50 MG tablet Take 1 tablet (50 mg total) by mouth daily. 30 tablet 3   sodium chloride (OCEAN) 0.65 % SOLN nasal spray Place 1 spray into both nostrils as needed for congestion.     tamsulosin (FLOMAX) 0.4 MG CAPS capsule Take 1 capsule  (0.4 mg total) by mouth daily. 30 capsule 0   torsemide (DEMADEX) 100 MG tablet Take 1 tablet (100 mg total) by mouth 2 (two) times daily. 60 tablet 3   triamcinolone (KENALOG) 0.025 % ointment Apply 1 application topically 2 (two) times daily. 30 g 3   VENTOLIN HFA 108 (90 Base) MCG/ACT inhaler Inhale 1 puff into the lungs every 4 (four) hours as needed for shortness of breath. 6.7 g 1   zolpidem (AMBIEN) 10 MG tablet Take 1 tablet (10 mg total) by mouth at bedtime as needed for sleep. 30 tablet 0   Current Facility-Administered Medications  Medication Dose Route Frequency Provider Last Rate Last Admin   aspirin tablet 325 mg  325 mg Oral Daily Vu, Trung T, MD   325 mg at 02/06/21 1013   Allergies  Allergen Reactions   Penicillins     Broke out in convulsions  as a child  Did it involve swelling of the face/tongue/throat, SOB, or low BP? N/A Did it involve sudden or severe rash/hives, skin peeling, or any reaction on the inside of your mouth or nose? N/A Did you need to seek medical attention at a hospital or doctor's office?N/A When did it last happen? N/A If all above answers are "NO", may proceed with cephalosporin use.   Social History   Socioeconomic History   Marital status: Single    Spouse name: Not on file   Number of children: Not on file   Years of education: Not on file   Highest education level: Not on file  Occupational History   Not on file  Tobacco Use   Smoking status: Never   Smokeless tobacco: Never  Vaping Use   Vaping Use: Never used  Substance and Sexual Activity   Alcohol use: Not Currently   Drug use: Not Currently    Types: Marijuana    Comment: Smoked for 30 years   Sexual activity: Not on file  Other Topics Concern   Not on file  Social History Narrative   Not on file   Social Determinants of Health   Financial Resource Strain: Medium Risk   Difficulty of Paying Living Expenses: Somewhat hard  Food Insecurity: No Food Insecurity   Worried  About Running Out of Food in the Last Year: Never true   Ran Out of Food in the Last Year: Never true  Transportation Needs: Unmet Transportation Needs   Lack of Transportation (Medical): Yes   Lack of Transportation (Non-Medical): No  Physical Activity: Not on file  Stress: Not on file  Social Connections: Not on file  Intimate Partner Violence: Not on file    Family History  Problem Relation Age of Onset   Hyperlipidemia Mother    Hypertension Mother    Stroke Mother    CAD Maternal Grandmother    CAD Maternal Grandfather    There were no vitals taken for this visit.  Wt Readings from Last 3 Encounters:  02/25/21 (!) 170.6 kg (376 lb)  02/18/21 (!) 168.7 kg (372 lb)  02/11/21 (!) 171.9 kg (379 lb)   PHYSICAL EXAM: General:  Well appearing. No resp `difficulty HEENT: normal Neck: JVP difficult to assess. Carotids 2+ bilat; no bruits. No lymphadenopathy or thryomegaly appreciated. Cor: PMI nondisplaced. Regular rate & rhythm. No rubs, gallops or murmurs. Lungs: clear Abdomen: soft, nontender, nondistended. No hepatosplenomegaly. No bruits or masses. Good bowel sounds. Extremities: no cyanosis, clubbing, rash, 1+ edema, chronic venous stasis changes noted Neuro: alert & orientedx3, cranial nerves grossly intact. moves all 4 extremities w/o difficulty. Affect flat   ASSESSMENT & PLAN: 1. Chronic diastolic CHF with suspected RV dysfunction: Echo 7/22 with EF 60-65%, mild LVH, normal RV, unable to estimate PA systolic pressure. Multiple CHF admissions despite aggressive outpatient diuretic regimen and paramedicine.  RHC (7/22) with optimized filling pressures, PCWP 13, CO/CI 7.3/2.4. Received IV lasix 80 mg through paramedicine 11/12/20. RHC (8/22) with normal filling pressures; underwent placement of Cardiomems.  - NYHA III at baseline. Volume looks good today. Venous stasis contributing to leg edema. Now reports wearing compression stockings daily. Edema significantly improved  from prior visits - Doing better with fluid and sodium intake. Paramedicine has been coaching him. Appreciate assistance. - Continue torsemide 100 mg bid + metolazone 2.5 mg on M/W/F + extra 40 KCl on metolazone days.  - Continue eplerenone 100 mg daily.  - Off Jardiance (GI upset). - BMET  today 2. H/o PE/DVT: Suspect due to sedentary lifestyle.  Occurred in 2/21.  V/Q scan in 6/21 did not show evidence for chronic PE.   - Continue apixaban. No bleeding issues.  3. OSA: Cannot tolerate CPAP. 4. Anxiety/depression: This is a major issue.   - Per PCP 5. CAD: Cath 1/22 with no significant CAD. No CP. 6. Hypertension: BP at goal - Continue Toprol XL 25 mg bid.   7. Atrial fibrillation: Paroxysmal. SR on ECG today - Continue apixaban. No bleeding issues. - Continue Toprol XL. 8. Lower extremity cellulitis/stasis dermatitis: Admitted 5/22 with possible BLE cellulitis. Legs are more edematous today with erythema and new blisters. He has been on multiple rounds of Keflex, Levaquin and doxycycline over the last several months.  - Seen by ID 10/22, lower extremity swelling and redness felt to be more likely d/t stasis dermatitis rather than cellulitis. No evidence of infection.  Follow-up: Dr. [name] as scheduled on 05/16/20  [facility], Symir Mah N PA-C 02/27/2021

## 2021-03-05 ENCOUNTER — Telehealth (HOSPITAL_COMMUNITY): Payer: Self-pay | Admitting: Surgery

## 2021-03-05 ENCOUNTER — Telehealth (HOSPITAL_COMMUNITY): Payer: Self-pay | Admitting: Licensed Clinical Social Worker

## 2021-03-05 ENCOUNTER — Encounter (HOSPITAL_COMMUNITY): Payer: Self-pay

## 2021-03-05 ENCOUNTER — Ambulatory Visit (HOSPITAL_COMMUNITY)
Admission: RE | Admit: 2021-03-05 | Discharge: 2021-03-05 | Disposition: A | Discharge status: Home / Self Care | Payer: Medicare HMO | Source: Ambulatory Visit | Attending: Cardiology | Admitting: Cardiology

## 2021-03-05 ENCOUNTER — Other Ambulatory Visit (HOSPITAL_COMMUNITY): Payer: Self-pay

## 2021-03-05 VITALS — BP 118/62 | HR 77 | Wt 379.6 lb

## 2021-03-05 DIAGNOSIS — F419 Anxiety disorder, unspecified: Secondary | ICD-10-CM | POA: Diagnosis not present

## 2021-03-05 DIAGNOSIS — I110 Hypertensive heart disease with heart failure: Secondary | ICD-10-CM | POA: Insufficient documentation

## 2021-03-05 DIAGNOSIS — Z86718 Personal history of other venous thrombosis and embolism: Secondary | ICD-10-CM | POA: Diagnosis not present

## 2021-03-05 DIAGNOSIS — J449 Chronic obstructive pulmonary disease, unspecified: Secondary | ICD-10-CM | POA: Insufficient documentation

## 2021-03-05 DIAGNOSIS — F32A Depression, unspecified: Secondary | ICD-10-CM | POA: Insufficient documentation

## 2021-03-05 DIAGNOSIS — Z7901 Long term (current) use of anticoagulants: Secondary | ICD-10-CM | POA: Diagnosis not present

## 2021-03-05 DIAGNOSIS — I5032 Chronic diastolic (congestive) heart failure: Secondary | ICD-10-CM | POA: Diagnosis present

## 2021-03-05 DIAGNOSIS — I10 Essential (primary) hypertension: Secondary | ICD-10-CM | POA: Diagnosis not present

## 2021-03-05 DIAGNOSIS — I878 Other specified disorders of veins: Secondary | ICD-10-CM | POA: Diagnosis not present

## 2021-03-05 DIAGNOSIS — Z86711 Personal history of pulmonary embolism: Secondary | ICD-10-CM | POA: Diagnosis not present

## 2021-03-05 DIAGNOSIS — L309 Dermatitis, unspecified: Secondary | ICD-10-CM | POA: Insufficient documentation

## 2021-03-05 DIAGNOSIS — I480 Paroxysmal atrial fibrillation: Secondary | ICD-10-CM | POA: Insufficient documentation

## 2021-03-05 DIAGNOSIS — I872 Venous insufficiency (chronic) (peripheral): Secondary | ICD-10-CM | POA: Diagnosis not present

## 2021-03-05 DIAGNOSIS — G4733 Obstructive sleep apnea (adult) (pediatric): Secondary | ICD-10-CM | POA: Diagnosis not present

## 2021-03-05 DIAGNOSIS — I2510 Atherosclerotic heart disease of native coronary artery without angina pectoris: Secondary | ICD-10-CM | POA: Insufficient documentation

## 2021-03-05 DIAGNOSIS — R600 Localized edema: Secondary | ICD-10-CM

## 2021-03-05 LAB — BASIC METABOLIC PANEL
Anion gap: 14 (ref 5–15)
BUN: 15 mg/dL (ref 8–23)
CO2: 31 mmol/L (ref 22–32)
Calcium: 9 mg/dL (ref 8.9–10.3)
Chloride: 92 mmol/L — ABNORMAL LOW (ref 98–111)
Creatinine, Ser: 0.95 mg/dL (ref 0.61–1.24)
GFR, Estimated: >60 mL/min (ref >60)
Glucose, Bld: 131 mg/dL — ABNORMAL HIGH (ref 70–99)
Potassium: 2.8 mmol/L — ABNORMAL LOW (ref 3.5–5.1)
Sodium: 137 mmol/L (ref 135–145)

## 2021-03-05 MED ORDER — POTASSIUM CHLORIDE CRYS ER 20 MEQ PO TBCR: 100.0000 meq | EXTENDED_RELEASE_TABLET | Freq: Two times a day (BID) | ORAL | 0 refills | Status: DC

## 2021-03-05 NOTE — Progress Notes (Signed)
Paramedicine Encounter    Patient ID: Charles Gray, male    DOB: December 04, 1955, 65 y.o.   MRN: [id number]   Met with Charles Gray in clinic today where he was seen by Marlyce Huge PA. [name] complains of increased shortness of breath and frequent constipation. These are not new symptoms for him. Cardio-MEMS reading at 8 which is below goal at 10. Legs look better compared to previous visits. EKG and LABS obtained today. Potassium noted to be low, increased to 100MEQ BID. [name] placed extra K in pillbox. He will be followed up on Monday for Labs in clinic and home visit with me on Tuesday.    Ride set up by Tammy Sours.   Refills to [facility]-(called in at 1610) Torsemide Potassium   Early called me around 1400 reporting he was informed that his RN and PT would no longer be coming out to see him in the home from [facility]. He reports he called to make an appeal and they would reach out to him with decision. I will continue to follow up.      ACTION: Home visit completed

## 2021-03-05 NOTE — Patient Instructions (Addendum)
Nice to see you today. Labs done today.  We will only call with abnormal values. Follow-up Appt scheduled with Dr. [name]

## 2021-03-05 NOTE — Telephone Encounter (Signed)
Patient called by Marlyce Huge in reference to low Potassium noted in labwork completed in clinic earlier.  Labwork recheck scheduled for Monday.  Potassium increased to 100 BID.  [name] HF Community Paramedic updated and medications updated in [place].

## 2021-03-05 NOTE — Telephone Encounter (Signed)
Consulted to get pt transport to lab appt on Monday- set up through [employer]  Will continue to follow and assist as needed  Jorge Ny, [facility] Desk#: [phone] Cell#: [phone]

## 2021-03-10 ENCOUNTER — Telehealth (HOSPITAL_COMMUNITY): Payer: Self-pay | Admitting: *Deleted

## 2021-03-10 ENCOUNTER — Other Ambulatory Visit (HOSPITAL_COMMUNITY): Payer: Medicare HMO

## 2021-03-10 NOTE — Telephone Encounter (Signed)
Pt called [name] with paramedicine stating he felt bad/sick and wanted to r/s his lab appt. Pt told her he might call EMS. Heather advised pt to keep lab appt but pt told her he needed to lay back down. Lab appt r/s until tomorrow but Nira Conn will follow up with pt and get more information about him feeling sick.

## 2021-03-11 ENCOUNTER — Telehealth (HOSPITAL_COMMUNITY): Payer: Self-pay

## 2021-03-11 ENCOUNTER — Other Ambulatory Visit (HOSPITAL_COMMUNITY): Payer: Medicare HMO

## 2021-03-11 ENCOUNTER — Other Ambulatory Visit (HOSPITAL_COMMUNITY): Payer: Self-pay

## 2021-03-11 NOTE — Telephone Encounter (Signed)
Charles Gray reached out to me in reference to his appeal for [facility] home health RN and PT and stated his appeal was granted and he will continue to have home health RN come out weekly and PT. Call complete.

## 2021-03-11 NOTE — Progress Notes (Signed)
Paramedicine Encounter    Patient ID: Charles Gray, male    DOB: [birth year], 65 y.o.   MRN: [id number]   Patient Care Team: Enid Skeens., MD as PCP - General (Family Medicine) Berniece Salines, DO as PCP - Cardiology (Cardiology) Jorge Ny, LCSW as Social Worker (Licensed Clinical Social Worker)  Patient Active Problem List   Diagnosis Date Noted   Acute encephalopathy 12/21/2020   COPD (chronic obstructive pulmonary disease) ([place]) 12/21/2020   Chronic diastolic (congestive) heart failure ([place]) 11/14/2020   Syncope 10/28/2020   CHF (congestive heart failure) ([place]) 10/28/2020   Chronic venous stasis dermatitis of both lower extremities 08/27/2020   Cellulitis of both lower extremities 08/14/2020   Microcytic anemia 08/14/2020   GERD without esophagitis 06/10/2020   Atrial fibrillation with rapid ventricular response ([place]) 03/24/2020   Acute on chronic right heart failure ([place]) 01/27/2020   Acute respiratory failure with hypoxia ([place])    Fatty liver disease, nonalcoholic    Shock ([place])    Personal history of DVT (deep vein thrombosis) 12/26/2019   Chronic anticoagulation 12/26/2019   Palpitations 12/26/2019   AF (paroxysmal atrial fibrillation) ([place]) 12/23/2019   Heart failure, systolic, acute ([place]) 37/01/6268   Acute on chronic congestive heart failure (HCC)    Chronic diastolic HF (heart failure) ([place]) 08/23/2019   Chest pain 08/23/2019   Lactic acidosis 08/23/2019   History of pulmonary embolism 08/12/2019   Atrial tachycardia ([place]) 08/12/2019   Occasional tremors 08/12/2019   OSA (obstructive sleep apnea) 08/12/2019   Acute on chronic right-sided heart failure ([place]) 08/06/2019   Pulmonary embolism ([place]) 06/08/2019   Normocytic anemia 06/08/2019   Pulmonary emboli ([place]) 06/08/2019   Chronic heart failure with preserved ejection fraction ([place]) 05/30/2019   Class 2 severe obesity due to excess calories with serious comorbidity and body mass index (BMI) of 39.0 to  39.9 in adult ([place]) 05/30/2019   Morbid obesity with BMI of 40.0-44.9, adult ([place]) 05/30/2019   Hyponatremia    Drug induced constipation    Peripheral edema    Hypotension due to drugs    Hypoalbuminemia due to protein-calorie malnutrition (HCC)    Hypokalemia    Thrombocytopenia (HCC)    Anemia of chronic disease    Cirrhosis of liver without ascites (HCC)    Chronic pain syndrome    Debility 03/13/2019   Portal hypertensive gastropathy ([place]) 03/06/2019   Dyspnea    GIB (gastrointestinal bleeding) 03/04/2019   Mixed hyperlipidemia 01/24/2019   Insomnia 01/24/2019   Hyperlipidemia 01/24/2019   Essential hypertension 01/24/2019   Lumbar disc herniation 01/24/2019    Current Outpatient Medications:    acetaminophen (TYLENOL) 500 MG tablet, Take 1,000 mg by mouth every 6 (six) hours as needed for moderate pain or headache., Disp: , Rfl:    apixaban (ELIQUIS) 5 MG TABS tablet, Take 1 tablet (5 mg total) by mouth 2 (two) times daily., Disp: 60 tablet, Rfl: 0   Buprenorphine HCl-Naloxone HCl 8-2 MG FILM, Place 1 tablet under the tongue 2 (two) times daily., Disp: , Rfl:    eplerenone (INSPRA) 50 MG tablet, Take 2 tablets (100 mg total) by mouth daily., Disp: 60 tablet, Rfl: 3   gabapentin (NEURONTIN) 800 MG tablet, Take 1 tablet (800 mg total) by mouth 3 (three) times daily., Disp: 90 tablet, Rfl: 1   lactulose (CHRONULAC) 10 GM/15ML solution, Take 15 mLs (10 g total) by mouth 3 (three) times daily., Disp: 946 mL, Rfl: 1   metolazone (ZAROXOLYN) 2.5 MG tablet,  Take 1 tablet (2.5 mg total) by mouth 3 (three) times a week. Monday, Wednesday and Fridays, Disp: 14 tablet, Rfl: 0   metoprolol succinate (TOPROL XL) 25 MG 24 hr tablet, Take 1 tablet (25 mg total) by mouth 2 (two) times daily., Disp: 60 tablet, Rfl: 2   ondansetron (ZOFRAN-ODT) 4 MG disintegrating tablet, TAKE 1 TABLET BY MOUTH EVERY 8 HOURS AS NEEDED FOR NAUSEA OR VOMITING, Disp: 20 tablet, Rfl: 2   pantoprazole (PROTONIX) 40 MG  tablet, Take 40 mg by mouth 2 (two) times daily., Disp: , Rfl:    polyethylene glycol (MIRALAX / GLYCOLAX) 17 g packet, Take 17 g by mouth as needed., Disp: , Rfl:    potassium chloride SA (KLOR-CON) 20 MEQ tablet, Take 5 tablets (100 mEq total) by mouth 2 (two) times daily. Extra 40 mEq with metolazone days., Disp: 260 tablet, Rfl: 0   pravastatin (PRAVACHOL) 20 MG tablet, Take 1 tablet (20 mg total) by mouth daily., Disp: 30 tablet, Rfl: 1   sertraline (ZOLOFT) 50 MG tablet, Take 1 tablet (50 mg total) by mouth daily., Disp: 30 tablet, Rfl: 3   sodium chloride (OCEAN) 0.65 % SOLN nasal spray, Place 1 spray into both nostrils as needed for congestion., Disp: , Rfl:    tamsulosin (FLOMAX) 0.4 MG CAPS capsule, Take 1 capsule (0.4 mg total) by mouth daily., Disp: 30 capsule, Rfl: 0   torsemide (DEMADEX) 100 MG tablet, Take 1 tablet (100 mg total) by mouth 2 (two) times daily., Disp: 60 tablet, Rfl: 3   triamcinolone (KENALOG) 0.025 % ointment, Apply 1 application topically 2 (two) times daily. (Patient taking differently: Apply 1 application topically 2 (two) times daily. As needed), Disp: 30 g, Rfl: 3   VENTOLIN HFA 108 (90 Base) MCG/ACT inhaler, Inhale 1 puff into the lungs every 4 (four) hours as needed for shortness of breath., Disp: 6.7 g, Rfl: 1   zolpidem (AMBIEN) 10 MG tablet, Take 1 tablet (10 mg total) by mouth at bedtime as needed for sleep., Disp: 30 tablet, Rfl: 0  Current Facility-Administered Medications:    aspirin tablet 325 mg, 325 mg, Oral, Daily, Vu, Trung T, MD, 325 mg at 02/06/21 1013 Allergies  Allergen Reactions   Penicillins     Broke out in convulsions as a child  Did it involve swelling of the face/tongue/throat, SOB, or low BP? N/A Did it involve sudden or severe rash/hives, skin peeling, or any reaction on the inside of your mouth or nose? N/A Did you need to seek medical attention at a hospital or doctor's office?N/A When did it last happen? N/A If all above answers  are "NO", may proceed with cephalosporin use.     Social History   Socioeconomic History   Marital status: Single    Spouse name: Not on file   Number of children: Not on file   Years of education: Not on file   Highest education level: Not on file  Occupational History   Not on file  Tobacco Use   Smoking status: Never   Smokeless tobacco: Never  Vaping Use   Vaping Use: Never used  Substance and Sexual Activity   Alcohol use: Not Currently   Drug use: Not Currently    Types: Marijuana    Comment: Smoked for 30 years   Sexual activity: Not on file  Other Topics Concern   Not on file  Social History Narrative   Not on file   Social Determinants of Health   Financial  Resource Strain: Medium Risk   Difficulty of Paying Living Expenses: Somewhat hard  Food Insecurity: No Food Insecurity   Worried About Charity fundraiser in the Last Year: Never true   Ran Out of Food in the Last Year: Never true  Transportation Needs: Unmet Transportation Needs   Lack of Transportation (Medical): Yes   Lack of Transportation (Non-Medical): No  Physical Activity: Not on file  Stress: Not on file  Social Connections: Not on file  Intimate Partner Violence: Not on file    Physical Exam Vitals reviewed.  Constitutional:      Appearance: Normal appearance. He is normal weight.  HENT:     Head: Normocephalic.     Nose: Nose normal.     Mouth/Throat:     Mouth: Mucous membranes are moist.     Pharynx: Oropharynx is clear.  Eyes:     Conjunctiva/sclera: Conjunctivae normal.     Pupils: Pupils are equal, round, and reactive to light.  Cardiovascular:     Rate and Rhythm: Normal rate and regular rhythm.     Pulses: Normal pulses.  Pulmonary:     Effort: Pulmonary effort is normal.     Breath sounds: Normal breath sounds.  Abdominal:     General: Abdomen is flat.     Palpations: Abdomen is soft.     Comments: Generalized abdominal pain.   Musculoskeletal:        General:  Swelling present.     Cervical back: Normal range of motion.     Right lower leg: Edema present.     Left lower leg: Edema present.     Comments: Reports trouble walking due to lower leg and back pain.  Skin:    General: Skin is warm and dry.     Capillary Refill: Capillary refill takes less than 2 seconds.  Neurological:     General: No focal deficit present.     Mental Status: He is alert. Mental status is at baseline.  Psychiatric:        Mood and Affect: Mood normal.    Arrived for home visit for Grae who reports not feeling well today. He states over the weekend he began having leg swelling, shortness of breath, dizziness, and intermittent chest pain. This happens often for Mr. Hendel and he reports this is similar to previous episodes he has had in the past. Today on exam both lower legs were unwrapped and noted to be red and swollen. They were not warm to the touch and note to have some small fluid pockets present on the lower areas of both legs. Mr. Frappier reports that his legs are hurting more today than last week. His legs did appear to be more swollen than last week. He states the RN who wraps his legs came out Saturday and transitioned him to a velcro spandex style compression stocking. He reports wearing them during the day and taking them off at night.   Vitals obtained and were recorded. Assessment as noted. Deonta and I reviewed his upcoming appointments and confirmed same with transportation confirmed.   Medications reviewed and confirmed. Pill box filled accordingly. (WITH UPDATED POTASSIUM 100MEQ BID)   Weight today 375lbs Weight last week 379lbs   Home visit complete. I will see Kamarion in one week in the home and will follow up after his lab visit over the phone tomorrow.   [facility] [place]  YI#[id number]     Future Appointments  Date Time Provider [place]  03/12/2021  10:15 AM [facility] LAB [facility] None  03/25/2021  9:00 AM Ricard Dillon, MD  [facility]  05/16/2021 11:20 AM Larey Dresser, MD [facility] None     ACTION: Home visit completed

## 2021-03-12 ENCOUNTER — Ambulatory Visit (HOSPITAL_COMMUNITY)
Admission: RE | Admit: 2021-03-12 | Discharge: 2021-03-12 | Disposition: A | Discharge status: Home / Self Care | Payer: Medicare HMO | Source: Ambulatory Visit | Attending: Family Medicine | Admitting: Family Medicine

## 2021-03-12 DIAGNOSIS — I5032 Chronic diastolic (congestive) heart failure: Secondary | ICD-10-CM | POA: Diagnosis not present

## 2021-03-12 LAB — BASIC METABOLIC PANEL
Anion gap: 8 (ref 5–15)
BUN: 16 mg/dL (ref 8–23)
CO2: 28 mmol/L (ref 22–32)
Calcium: 9.3 mg/dL (ref 8.9–10.3)
Chloride: 99 mmol/L (ref 98–111)
Creatinine, Ser: 0.92 mg/dL (ref 0.61–1.24)
GFR, Estimated: >60 mL/min (ref >60)
Glucose, Bld: 117 mg/dL — ABNORMAL HIGH (ref 70–99)
Potassium: 4 mmol/L (ref 3.5–5.1)
Sodium: 135 mmol/L (ref 135–145)

## 2021-03-18 ENCOUNTER — Other Ambulatory Visit (HOSPITAL_COMMUNITY): Payer: Self-pay

## 2021-03-18 NOTE — Progress Notes (Signed)
Paramedicine Encounter    Patient ID: Charles Gray, male    DOB: 12/08/1955, 65 y.o.   MRN: [id number]   Patient Care Team: Enid Skeens., MD as PCP - General (Family Medicine) Berniece Salines, DO as PCP - Cardiology (Cardiology) Jorge Ny, LCSW as Social Worker (Licensed Clinical Social Worker)  Patient Active Problem List   Diagnosis Date Noted   Acute encephalopathy 12/21/2020   COPD (chronic obstructive pulmonary disease) ([place]) 12/21/2020   Chronic diastolic (congestive) heart failure ([place]) 11/14/2020   Syncope 10/28/2020   CHF (congestive heart failure) ([place]) 10/28/2020   Chronic venous stasis dermatitis of both lower extremities 08/27/2020   Cellulitis of both lower extremities 08/14/2020   Microcytic anemia 08/14/2020   GERD without esophagitis 06/10/2020   Atrial fibrillation with rapid ventricular response ([place]) 03/24/2020   Acute on chronic right heart failure ([place]) 01/27/2020   Acute respiratory failure with hypoxia ([place])    Fatty liver disease, nonalcoholic    Shock ([place])    Personal history of DVT (deep vein thrombosis) 12/26/2019   Chronic anticoagulation 12/26/2019   Palpitations 12/26/2019   AF (paroxysmal atrial fibrillation) ([place]) 12/23/2019   Heart failure, systolic, acute ([place]) 87/56/4332   Acute on chronic congestive heart failure (HCC)    Chronic diastolic HF (heart failure) ([place]) 08/23/2019   Chest pain 08/23/2019   Lactic acidosis 08/23/2019   History of pulmonary embolism 08/12/2019   Atrial tachycardia ([place]) 08/12/2019   Occasional tremors 08/12/2019   OSA (obstructive sleep apnea) 08/12/2019   Acute on chronic right-sided heart failure ([place]) 08/06/2019   Pulmonary embolism ([place]) 06/08/2019   Normocytic anemia 06/08/2019   Pulmonary emboli ([place]) 06/08/2019   Chronic heart failure with preserved ejection fraction ([place]) 05/30/2019   Class 2 severe obesity due to excess calories with serious comorbidity and body mass index (BMI) of 39.0 to  39.9 in adult ([place]) 05/30/2019   Morbid obesity with BMI of 40.0-44.9, adult ([place]) 05/30/2019   Hyponatremia    Drug induced constipation    Peripheral edema    Hypotension due to drugs    Hypoalbuminemia due to protein-calorie malnutrition (HCC)    Hypokalemia    Thrombocytopenia (HCC)    Anemia of chronic disease    Cirrhosis of liver without ascites (HCC)    Chronic pain syndrome    Debility 03/13/2019   Portal hypertensive gastropathy ([place]) 03/06/2019   Dyspnea    GIB (gastrointestinal bleeding) 03/04/2019   Mixed hyperlipidemia 01/24/2019   Insomnia 01/24/2019   Hyperlipidemia 01/24/2019   Essential hypertension 01/24/2019   Lumbar disc herniation 01/24/2019    Current Outpatient Medications:    acetaminophen (TYLENOL) 500 MG tablet, Take 1,000 mg by mouth every 6 (six) hours as needed for moderate pain or headache., Disp: , Rfl:    apixaban (ELIQUIS) 5 MG TABS tablet, Take 1 tablet (5 mg total) by mouth 2 (two) times daily., Disp: 60 tablet, Rfl: 0   Buprenorphine HCl-Naloxone HCl 8-2 MG FILM, Place 1 tablet under the tongue 2 (two) times daily., Disp: , Rfl:    eplerenone (INSPRA) 50 MG tablet, Take 2 tablets (100 mg total) by mouth daily., Disp: 60 tablet, Rfl: 3   gabapentin (NEURONTIN) 800 MG tablet, Take 1 tablet (800 mg total) by mouth 3 (three) times daily., Disp: 90 tablet, Rfl: 1   lactulose (CHRONULAC) 10 GM/15ML solution, Take 15 mLs (10 g total) by mouth 3 (three) times daily., Disp: 946 mL, Rfl: 1   metolazone (ZAROXOLYN) 2.5 MG tablet,  Take 1 tablet (2.5 mg total) by mouth 3 (three) times a week. Monday, Wednesday and Fridays, Disp: 14 tablet, Rfl: 0   metoprolol succinate (TOPROL XL) 25 MG 24 hr tablet, Take 1 tablet (25 mg total) by mouth 2 (two) times daily., Disp: 60 tablet, Rfl: 2   ondansetron (ZOFRAN-ODT) 4 MG disintegrating tablet, TAKE 1 TABLET BY MOUTH EVERY 8 HOURS AS NEEDED FOR NAUSEA OR VOMITING, Disp: 20 tablet, Rfl: 2   pantoprazole (PROTONIX) 40 MG  tablet, Take 40 mg by mouth 2 (two) times daily., Disp: , Rfl:    polyethylene glycol (MIRALAX / GLYCOLAX) 17 g packet, Take 17 g by mouth as needed., Disp: , Rfl:    potassium chloride SA (KLOR-CON) 20 MEQ tablet, Take 5 tablets (100 mEq total) by mouth 2 (two) times daily. Extra 40 mEq with metolazone days., Disp: 260 tablet, Rfl: 0   pravastatin (PRAVACHOL) 20 MG tablet, Take 1 tablet (20 mg total) by mouth daily., Disp: 30 tablet, Rfl: 1   sertraline (ZOLOFT) 50 MG tablet, Take 1 tablet (50 mg total) by mouth daily., Disp: 30 tablet, Rfl: 3   sodium chloride (OCEAN) 0.65 % SOLN nasal spray, Place 1 spray into both nostrils as needed for congestion., Disp: , Rfl:    tamsulosin (FLOMAX) 0.4 MG CAPS capsule, Take 1 capsule (0.4 mg total) by mouth daily., Disp: 30 capsule, Rfl: 0   torsemide (DEMADEX) 100 MG tablet, Take 1 tablet (100 mg total) by mouth 2 (two) times daily., Disp: 60 tablet, Rfl: 3   triamcinolone (KENALOG) 0.025 % ointment, Apply 1 application topically 2 (two) times daily. (Patient taking differently: Apply 1 application topically 2 (two) times daily. As needed), Disp: 30 g, Rfl: 3   VENTOLIN HFA 108 (90 Base) MCG/ACT inhaler, Inhale 1 puff into the lungs every 4 (four) hours as needed for shortness of breath., Disp: 6.7 g, Rfl: 1   zolpidem (AMBIEN) 10 MG tablet, Take 1 tablet (10 mg total) by mouth at bedtime as needed for sleep., Disp: 30 tablet, Rfl: 0  Current Facility-Administered Medications:    aspirin tablet 325 mg, 325 mg, Oral, Daily, Vu, Trung T, MD, 325 mg at 02/06/21 1013 Allergies  Allergen Reactions   Penicillins     Broke out in convulsions as a child  Did it involve swelling of the face/tongue/throat, SOB, or low BP? N/A Did it involve sudden or severe rash/hives, skin peeling, or any reaction on the inside of your mouth or nose? N/A Did you need to seek medical attention at a hospital or doctor's office?N/A When did it last happen? N/A If all above answers  are "NO", may proceed with cephalosporin use.     Social History   Socioeconomic History   Marital status: Single    Spouse name: Not on file   Number of children: Not on file   Years of education: Not on file   Highest education level: Not on file  Occupational History   Not on file  Tobacco Use   Smoking status: Never   Smokeless tobacco: Never  Vaping Use   Vaping Use: Never used  Substance and Sexual Activity   Alcohol use: Not Currently   Drug use: Not Currently    Types: Marijuana    Comment: Smoked for 30 years   Sexual activity: Not on file  Other Topics Concern   Not on file  Social History Narrative   Not on file   Social Determinants of Health   Financial  Resource Strain: Medium Risk   Difficulty of Paying Living Expenses: Somewhat hard  Food Insecurity: No Food Insecurity   Worried About Charity fundraiser in the Last Year: Never true   Ran Out of Food in the Last Year: Never true  Transportation Needs: Unmet Transportation Needs   Lack of Transportation (Medical): Yes   Lack of Transportation (Non-Medical): No  Physical Activity: Not on file  Stress: Not on file  Social Connections: Not on file  Intimate Partner Violence: Not on file    Physical Exam Vitals reviewed.  Constitutional:      Appearance: Normal appearance. He is normal weight.  HENT:     Head: Normocephalic.     Nose: Nose normal.     Mouth/Throat:     Mouth: Mucous membranes are moist.     Pharynx: Oropharynx is clear.  Eyes:     Conjunctiva/sclera: Conjunctivae normal.     Pupils: Pupils are equal, round, and reactive to light.  Cardiovascular:     Rate and Rhythm: Normal rate and regular rhythm.     Pulses: Normal pulses.     Heart sounds: Normal heart sounds.  Pulmonary:     Effort: Pulmonary effort is normal. No respiratory distress.     Breath sounds: Normal breath sounds. No stridor. No wheezing, rhonchi or rales.  Abdominal:     General: There is distension.   Musculoskeletal:        General: Swelling present. Normal range of motion.     Cervical back: Normal range of motion.     Right lower leg: Edema present.     Left lower leg: Edema present.  Skin:    General: Skin is warm and dry.     Capillary Refill: Capillary refill takes less than 2 seconds.  Neurological:     General: No focal deficit present.     Mental Status: He is alert. Mental status is at baseline.  Psychiatric:        Mood and Affect: Mood normal.    Arrived for home visit for Mr. Spanbauer who was alert and oriented reporting to be feeling sick to his stomach. He states he is constipated and has been for a few days only having small bowel movements. He states he has been using Miralax and taking his Lactulose daily. I obtained vitals and assessment and abdomen noted to be hard and distended. He stated he has no fiber in his diet and I encouraged him to utilize metamucil and/or adding fiber to his diet. He agreed with plan. Lower legs noted to be swollen with some redness to both, +3 edema to right and left lower legs. Weight today is 377lbs Weight last week was 375lbs  He has been med compliant and denied skipping any doses. When I assessed his pill box he missed two bedtime doses of pantoprazole and pravastatin.   I reviewed medications and confirmed same. I filled pill box accordingly. Due to the size of the potassium and amount he has to take I moved his potassium to the following:  60MEQ MORN 40MQ NOON 60MEQ EVE 40MEQ BEDTIME  40MEQ on MWF at NOON due to IXL agreed with plan.   We reviewed appointments and confirmed transportation. Home visit complete. I will see Ajeet in one week.   Refills: NONE     Future Appointments  Date Time Provider [place]  03/25/2021  9:00 AM Ricard Dillon, MD [facility]  05/16/2021 11:20 AM Larey Dresser,  MD [facility] None     ACTION: Home visit completed

## 2021-03-19 ENCOUNTER — Telehealth (HOSPITAL_COMMUNITY): Payer: Self-pay | Admitting: Licensed Clinical Social Worker

## 2021-03-19 NOTE — Telephone Encounter (Signed)
HF Paramedicine Team Based Care Meeting  HF MD- NA  HF NP - [place] NP-C   [facility] admit within the last 30 days for heart failure? no  Medications concerns? no  Transportation issues ? Uses [facility]  Education needs? no  SDOH -no  Eligible for discharge? Continues to have med changes and needs close monitoring to remain successful   Jorge Ny, [facility] Desk#: [phone] Cell#: [phone]

## 2021-03-24 ENCOUNTER — Telehealth (HOSPITAL_COMMUNITY): Payer: Self-pay

## 2021-03-24 NOTE — Telephone Encounter (Signed)
Spoke to [employer] who agreed to home visit in the morning at 0900. Call complete.

## 2021-03-25 ENCOUNTER — Encounter (HOSPITAL_BASED_OUTPATIENT_CLINIC_OR_DEPARTMENT_OTHER): Payer: Medicare HMO | Admitting: Internal Medicine

## 2021-03-25 ENCOUNTER — Other Ambulatory Visit (HOSPITAL_COMMUNITY): Payer: Self-pay | Admitting: Adult Health

## 2021-03-25 ENCOUNTER — Other Ambulatory Visit (HOSPITAL_COMMUNITY): Payer: Self-pay | Admitting: Physician Assistant

## 2021-03-25 ENCOUNTER — Other Ambulatory Visit (HOSPITAL_COMMUNITY): Payer: Self-pay

## 2021-03-25 NOTE — Progress Notes (Signed)
Paramedicine Encounter    Patient ID: Charles Gray, male    DOB: 01-24-1956, 65 y.o.   MRN: [id number]   Arrived for home visit for Charles Gray who reports feeling bad today. He stated his lower left back pain, constipation, swelling in his lower legs. Charles Gray stated he is short of breath while walking today. Charles Gray missed last nights medications including Torsemide. Weight up this week 8lbs. Vitals obtained and are as noted. Assessment noted some swelling in lower legs. Charles Gray admitted to eating foods with salt. I messaged NP [name] Clegg  who reports his CardioMems reading is 12 which goal 10. I reminded Charles Gray the sodium intake limit,fluid intake limit as well as remembering to take his medications. We also discussed diet management. He agreed with same.   We reviewed meds, pill box filled.   Appointments reviewed.   No update on [place] home health or physical therapy.   Weight this week- 384lbs Weight last week- 377lbs  Refills: Potassium Pantoprazole Inspra     Patient Care Team: Enid Skeens., MD as PCP - General (Family Medicine) Berniece Salines, DO as PCP - Cardiology (Cardiology) Jorge Ny, LCSW as Social Worker (Licensed Clinical Social Worker)  Patient Active Problem List   Diagnosis Date Noted   Acute encephalopathy 12/21/2020   COPD (chronic obstructive pulmonary disease) ([place]) 12/21/2020   Chronic diastolic (congestive) heart failure ([place]) 11/14/2020   Syncope 10/28/2020   CHF (congestive heart failure) ([place]) 10/28/2020   Chronic venous stasis dermatitis of both lower extremities 08/27/2020   Cellulitis of both lower extremities 08/14/2020   Microcytic anemia 08/14/2020   GERD without esophagitis 06/10/2020   Atrial fibrillation with rapid ventricular response ([place]) 03/24/2020   Acute on chronic right heart failure ([place]) 01/27/2020   Acute respiratory failure with hypoxia ([place])    Fatty liver disease, nonalcoholic    Shock ([place])    Personal history of DVT (deep  vein thrombosis) 12/26/2019   Chronic anticoagulation 12/26/2019   Palpitations 12/26/2019   AF (paroxysmal atrial fibrillation) ([place]) 12/23/2019   Heart failure, systolic, acute ([place]) 16/60/6301   Acute on chronic congestive heart failure (HCC)    Chronic diastolic HF (heart failure) ([place]) 08/23/2019   Chest pain 08/23/2019   Lactic acidosis 08/23/2019   History of pulmonary embolism 08/12/2019   Atrial tachycardia ([place]) 08/12/2019   Occasional tremors 08/12/2019   OSA (obstructive sleep apnea) 08/12/2019   Acute on chronic right-sided heart failure ([place]) 08/06/2019   Pulmonary embolism ([place]) 06/08/2019   Normocytic anemia 06/08/2019   Pulmonary emboli ([place]) 06/08/2019   Chronic heart failure with preserved ejection fraction ([place]) 05/30/2019   Class 2 severe obesity due to excess calories with serious comorbidity and body mass index (BMI) of 39.0 to 39.9 in adult ([place]) 05/30/2019   Morbid obesity with BMI of 40.0-44.9, adult ([place]) 05/30/2019   Hyponatremia    Drug induced constipation    Peripheral edema    Hypotension due to drugs    Hypoalbuminemia due to protein-calorie malnutrition (HCC)    Hypokalemia    Thrombocytopenia (HCC)    Anemia of chronic disease    Cirrhosis of liver without ascites (HCC)    Chronic pain syndrome    Debility 03/13/2019   Portal hypertensive gastropathy ([place]) 03/06/2019   Dyspnea    GIB (gastrointestinal bleeding) 03/04/2019   Mixed hyperlipidemia 01/24/2019   Insomnia 01/24/2019   Hyperlipidemia 01/24/2019   Essential hypertension 01/24/2019   Lumbar disc herniation 01/24/2019    Current Outpatient Medications:  acetaminophen (TYLENOL) 500 MG tablet, Take 1,000 mg by mouth every 6 (six) hours as needed for moderate pain or headache., Disp: , Rfl:    apixaban (ELIQUIS) 5 MG TABS tablet, Take 1 tablet (5 mg total) by mouth 2 (two) times daily., Disp: 60 tablet, Rfl: 0   Buprenorphine HCl-Naloxone HCl 8-2 MG FILM, Place 1 tablet under the  tongue 2 (two) times daily., Disp: , Rfl:    eplerenone (INSPRA) 50 MG tablet, Take 2 tablets (100 mg total) by mouth daily., Disp: 60 tablet, Rfl: 3   gabapentin (NEURONTIN) 800 MG tablet, Take 1 tablet (800 mg total) by mouth 3 (three) times daily., Disp: 90 tablet, Rfl: 1   lactulose (CHRONULAC) 10 GM/15ML solution, Take 15 mLs (10 g total) by mouth 3 (three) times daily., Disp: 946 mL, Rfl: 1   metolazone (ZAROXOLYN) 2.5 MG tablet, Take 1 tablet (2.5 mg total) by mouth 3 (three) times a week. Monday, Wednesday and Fridays, Disp: 14 tablet, Rfl: 0   metoprolol succinate (TOPROL XL) 25 MG 24 hr tablet, Take 1 tablet (25 mg total) by mouth 2 (two) times daily., Disp: 60 tablet, Rfl: 2   ondansetron (ZOFRAN-ODT) 4 MG disintegrating tablet, TAKE 1 TABLET BY MOUTH EVERY 8 HOURS AS NEEDED FOR NAUSEA OR VOMITING, Disp: 20 tablet, Rfl: 2   pantoprazole (PROTONIX) 40 MG tablet, Take 40 mg by mouth 2 (two) times daily., Disp: , Rfl:    polyethylene glycol (MIRALAX / GLYCOLAX) 17 g packet, Take 17 g by mouth as needed., Disp: , Rfl:    potassium chloride SA (KLOR-CON) 20 MEQ tablet, Take 5 tablets (100 mEq total) by mouth 2 (two) times daily. Extra 40 mEq with metolazone days., Disp: 260 tablet, Rfl: 0   pravastatin (PRAVACHOL) 20 MG tablet, Take 1 tablet (20 mg total) by mouth daily., Disp: 30 tablet, Rfl: 1   sertraline (ZOLOFT) 50 MG tablet, Take 1 tablet (50 mg total) by mouth daily., Disp: 30 tablet, Rfl: 3   sodium chloride (OCEAN) 0.65 % SOLN nasal spray, Place 1 spray into both nostrils as needed for congestion., Disp: , Rfl:    tamsulosin (FLOMAX) 0.4 MG CAPS capsule, Take 1 capsule (0.4 mg total) by mouth daily., Disp: 30 capsule, Rfl: 0   torsemide (DEMADEX) 100 MG tablet, Take 1 tablet (100 mg total) by mouth 2 (two) times daily., Disp: 60 tablet, Rfl: 3   triamcinolone (KENALOG) 0.025 % ointment, Apply 1 application topically 2 (two) times daily. (Patient taking differently: Apply 1 application  topically 2 (two) times daily. As needed), Disp: 30 g, Rfl: 3   VENTOLIN HFA 108 (90 Base) MCG/ACT inhaler, Inhale 1 puff into the lungs every 4 (four) hours as needed for shortness of breath., Disp: 6.7 g, Rfl: 1   zolpidem (AMBIEN) 10 MG tablet, Take 1 tablet (10 mg total) by mouth at bedtime as needed for sleep., Disp: 30 tablet, Rfl: 0  Current Facility-Administered Medications:    aspirin tablet 325 mg, 325 mg, Oral, Daily, Vu, Trung T, MD, 325 mg at 02/06/21 1013 Allergies  Allergen Reactions   Penicillins     Broke out in convulsions as a child  Did it involve swelling of the face/tongue/throat, SOB, or low BP? N/A Did it involve sudden or severe rash/hives, skin peeling, or any reaction on the inside of your mouth or nose? N/A Did you need to seek medical attention at a hospital or doctor's office?N/A When did it last happen? N/A If all above answers  are "NO", may proceed with cephalosporin use.     Social History   Socioeconomic History   Marital status: Single    Spouse name: Not on file   Number of children: Not on file   Years of education: Not on file   Highest education level: Not on file  Occupational History   Not on file  Tobacco Use   Smoking status: Never   Smokeless tobacco: Never  Vaping Use   Vaping Use: Never used  Substance and Sexual Activity   Alcohol use: Not Currently   Drug use: Not Currently    Types: Marijuana    Comment: Smoked for 30 years   Sexual activity: Not on file  Other Topics Concern   Not on file  Social History Narrative   Not on file   Social Determinants of Health   Financial Resource Strain: Medium Risk   Difficulty of Paying Living Expenses: Somewhat hard  Food Insecurity: No Food Insecurity   Worried About Running Out of Food in the Last Year: Never true   Ran Out of Food in the Last Year: Never true  Transportation Needs: Unmet Transportation Needs   Lack of Transportation (Medical): Yes   Lack of Transportation  (Non-Medical): No  Physical Activity: Not on file  Stress: Not on file  Social Connections: Not on file  Intimate Partner Violence: Not on file    Physical Exam Vitals reviewed.  Constitutional:      Appearance: Normal appearance. He is normal weight.  HENT:     Head: Normocephalic.     Nose: Nose normal.     Mouth/Throat:     Mouth: Mucous membranes are moist.     Pharynx: Oropharynx is clear.  Eyes:     Conjunctiva/sclera: Conjunctivae normal.     Pupils: Pupils are equal, round, and reactive to light.  Cardiovascular:     Rate and Rhythm: Normal rate and regular rhythm.     Pulses: Normal pulses.     Heart sounds: Normal heart sounds.  Pulmonary:     Effort: Pulmonary effort is normal.     Breath sounds: Normal breath sounds.  Abdominal:     General: There is distension.     Tenderness: There is abdominal tenderness.  Musculoskeletal:        General: Swelling present. Normal range of motion.     Cervical back: Normal range of motion.     Right lower leg: Edema present.     Left lower leg: Edema present.  Skin:    General: Skin is warm and dry.     Capillary Refill: Capillary refill takes less than 2 seconds.  Neurological:     General: No focal deficit present.     Mental Status: He is alert. Mental status is at baseline.  Psychiatric:        Mood and Affect: Mood normal.        Future Appointments  Date Time Provider [place]  05/16/2021 11:20 AM Larey Dresser, MD [facility] None     ACTION: Home visit completed

## 2021-04-01 ENCOUNTER — Other Ambulatory Visit (HOSPITAL_COMMUNITY): Payer: Self-pay

## 2021-04-01 NOTE — Progress Notes (Signed)
Paramedicine Encounter    Patient ID: Charles Gray, male    DOB: [birth year], 65 y.o.   MRN: [id number]   Arrived for home visit where Charles Gray was seated in his recliner alert and oriented reporting to be feeling poorly. He states that he has a headache, his stomach hurts and his legs are swollen and weeping. He also reports feeling short of breath while walking. Sharrieff missed his Metolazone dose yesterday. He also reports he has been eating fast food, snacks and high sodium drinks and foods. Weight is up 9lbs today from last Tuesday.   Vitals and assessment as noted.  I advised Charles Gray the importance of taking his medications as prescribed and adhering to the low sodium diet we have encouraged. I have told him these things will help keep his fluid down and help to keep him healthy at home. He agreed and understood, admitting his choices.   I reviewed medications and filled one week of medications in his pill box. I gave him todays medications and yesterdays metolazone to compensate for the one he skipped.   We reviewed diet and health maintenance at home. He agreed with plans.   Appointments reviewed. Home visit complete. I will see Charles Gray in one week.   Refills: Lactulose Eliquis Torsemide  -Giving tree items given to [facility] today, he was grateful.     Patient Care Team: Enid Skeens., MD as PCP - General (Family Medicine) Berniece Salines, DO as PCP - Cardiology (Cardiology) Jorge Ny, LCSW as Social Worker (Licensed Clinical Social Worker)  Patient Active Problem List   Diagnosis Date Noted   Acute encephalopathy 12/21/2020   COPD (chronic obstructive pulmonary disease) ([name]) 12/21/2020   Chronic diastolic (congestive) heart failure ([place]) 11/14/2020   Syncope 10/28/2020   CHF (congestive heart failure) ([place]) 10/28/2020   Chronic venous stasis dermatitis of both lower extremities 08/27/2020   Cellulitis of both lower extremities 08/14/2020   Microcytic anemia 08/14/2020    GERD without esophagitis 06/10/2020   Atrial fibrillation with rapid ventricular response ([place]) 03/24/2020   Acute on chronic right heart failure ([place]) 01/27/2020   Acute respiratory failure with hypoxia (HCC)    Fatty liver disease, nonalcoholic    Shock ([place])    Personal history of DVT (deep vein thrombosis) 12/26/2019   Chronic anticoagulation 12/26/2019   Palpitations 12/26/2019   AF (paroxysmal atrial fibrillation) ([place]) 12/23/2019   Heart failure, systolic, acute ([place]) 40/98/1191   Acute on chronic congestive heart failure (HCC)    Chronic diastolic HF (heart failure) ([place]) 08/23/2019   Chest pain 08/23/2019   Lactic acidosis 08/23/2019   History of pulmonary embolism 08/12/2019   Atrial tachycardia ([place]) 08/12/2019   Occasional tremors 08/12/2019   OSA (obstructive sleep apnea) 08/12/2019   Acute on chronic right-sided heart failure ([place]) 08/06/2019   Pulmonary embolism ([place]) 06/08/2019   Normocytic anemia 06/08/2019   Pulmonary emboli ([place]) 06/08/2019   Chronic heart failure with preserved ejection fraction ([place]) 05/30/2019   Class 2 severe obesity due to excess calories with serious comorbidity and body mass index (BMI) of 39.0 to 39.9 in adult ([place]) 05/30/2019   Morbid obesity with BMI of 40.0-44.9, adult ([place]) 05/30/2019   Hyponatremia    Drug induced constipation    Peripheral edema    Hypotension due to drugs    Hypoalbuminemia due to protein-calorie malnutrition (HCC)    Hypokalemia    Thrombocytopenia (HCC)    Anemia of chronic disease    Cirrhosis of liver without ascites (  [place])    Chronic pain syndrome    Debility 03/13/2019   Portal hypertensive gastropathy ([place]) 03/06/2019   Dyspnea    GIB (gastrointestinal bleeding) 03/04/2019   Mixed hyperlipidemia 01/24/2019   Insomnia 01/24/2019   Hyperlipidemia 01/24/2019   Essential hypertension 01/24/2019   Lumbar disc herniation 01/24/2019    Current Outpatient Medications:    acetaminophen (TYLENOL) 500 MG  tablet, Take 1,000 mg by mouth every 6 (six) hours as needed for moderate pain or headache., Disp: , Rfl:    apixaban (ELIQUIS) 5 MG TABS tablet, Take 1 tablet (5 mg total) by mouth 2 (two) times daily., Disp: 60 tablet, Rfl: 0   Buprenorphine HCl-Naloxone HCl 8-2 MG FILM, Place 1 tablet under the tongue 2 (two) times daily., Disp: , Rfl:    eplerenone (INSPRA) 50 MG tablet, TAKE 2 TABLETS BY MOUTH DAILY, Disp: 60 tablet, Rfl: 3   gabapentin (NEURONTIN) 800 MG tablet, Take 1 tablet (800 mg total) by mouth 3 (three) times daily., Disp: 90 tablet, Rfl: 1   lactulose (CHRONULAC) 10 GM/15ML solution, Take 15 mLs (10 g total) by mouth 3 (three) times daily., Disp: 946 mL, Rfl: 1   metolazone (ZAROXOLYN) 2.5 MG tablet, Take 1 tablet (2.5 mg total) by mouth 3 (three) times a week. Monday, Wednesday and Fridays, Disp: 14 tablet, Rfl: 0   metoprolol succinate (TOPROL XL) 25 MG 24 hr tablet, Take 1 tablet (25 mg total) by mouth 2 (two) times daily., Disp: 60 tablet, Rfl: 2   ondansetron (ZOFRAN-ODT) 4 MG disintegrating tablet, TAKE 1 TABLET BY MOUTH EVERY 8 HOURS AS NEEDED FOR NAUSEA OR VOMITING, Disp: 20 tablet, Rfl: 2   pantoprazole (PROTONIX) 40 MG tablet, Take 40 mg by mouth 2 (two) times daily., Disp: , Rfl:    polyethylene glycol (MIRALAX / GLYCOLAX) 17 g packet, Take 17 g by mouth as needed., Disp: , Rfl:    potassium chloride SA (KLOR-CON M) 20 MEQ tablet, TAKE 5 TABLETS BY MOUTH TWICE DAILY. EXTRA 40 MEQ WITH METOLAZONE DAYS., Disp: 260 tablet, Rfl: 0   pravastatin (PRAVACHOL) 20 MG tablet, Take 1 tablet (20 mg total) by mouth daily., Disp: 30 tablet, Rfl: 1   sertraline (ZOLOFT) 50 MG tablet, Take 1 tablet (50 mg total) by mouth daily., Disp: 30 tablet, Rfl: 3   sodium chloride (OCEAN) 0.65 % SOLN nasal spray, Place 1 spray into both nostrils as needed for congestion., Disp: , Rfl:    tamsulosin (FLOMAX) 0.4 MG CAPS capsule, Take 1 capsule (0.4 mg total) by mouth daily., Disp: 30 capsule, Rfl: 0    torsemide (DEMADEX) 100 MG tablet, Take 1 tablet (100 mg total) by mouth 2 (two) times daily., Disp: 60 tablet, Rfl: 3   triamcinolone (KENALOG) 0.025 % ointment, Apply 1 application topically 2 (two) times daily. (Patient taking differently: Apply 1 application topically 2 (two) times daily. As needed), Disp: 30 g, Rfl: 3   VENTOLIN HFA 108 (90 Base) MCG/ACT inhaler, Inhale 1 puff into the lungs every 4 (four) hours as needed for shortness of breath., Disp: 6.7 g, Rfl: 1   zolpidem (AMBIEN) 10 MG tablet, Take 1 tablet (10 mg total) by mouth at bedtime as needed for sleep., Disp: 30 tablet, Rfl: 0  Current Facility-Administered Medications:    aspirin tablet 325 mg, 325 mg, Oral, Daily, Vu, Trung T, MD, 325 mg at 02/06/21 1013 Allergies  Allergen Reactions   Penicillins     Broke out in convulsions as a child  Did  it involve swelling of the face/tongue/throat, SOB, or low BP? N/A Did it involve sudden or severe rash/hives, skin peeling, or any reaction on the inside of your mouth or nose? N/A Did you need to seek medical attention at a hospital or doctor's office?N/A When did it last happen? N/A If all above answers are "NO", may proceed with cephalosporin use.     Social History   Socioeconomic History   Marital status: Single    Spouse name: Not on file   Number of children: Not on file   Years of education: Not on file   Highest education level: Not on file  Occupational History   Not on file  Tobacco Use   Smoking status: Never   Smokeless tobacco: Never  Vaping Use   Vaping Use: Never used  Substance and Sexual Activity   Alcohol use: Not Currently   Drug use: Not Currently    Types: Marijuana    Comment: Smoked for 30 years   Sexual activity: Not on file  Other Topics Concern   Not on file  Social History Narrative   Not on file   Social Determinants of Health   Financial Resource Strain: Medium Risk   Difficulty of Paying Living Expenses: Somewhat hard  Food  Insecurity: No Food Insecurity   Worried About Running Out of Food in the Last Year: Never true   Ran Out of Food in the Last Year: Never true  Transportation Needs: Unmet Transportation Needs   Lack of Transportation (Medical): Yes   Lack of Transportation (Non-Medical): No  Physical Activity: Not on file  Stress: Not on file  Social Connections: Not on file  Intimate Partner Violence: Not on file    Physical Exam Vitals reviewed.  Constitutional:      Appearance: Normal appearance. He is normal weight.  HENT:     Head: Normocephalic.     Nose: Nose normal.     Mouth/Throat:     Mouth: Mucous membranes are moist.     Pharynx: Oropharynx is clear.  Eyes:     Conjunctiva/sclera: Conjunctivae normal.     Pupils: Pupils are equal, round, and reactive to light.  Cardiovascular:     Rate and Rhythm: Normal rate and regular rhythm.     Pulses: Normal pulses.     Heart sounds: Normal heart sounds.  Pulmonary:     Effort: Pulmonary effort is normal.     Breath sounds: Normal breath sounds.  Abdominal:     Palpations: Abdomen is soft.  Musculoskeletal:        General: Swelling present. Normal range of motion.     Cervical back: Normal range of motion.     Right lower leg: Edema present.     Left lower leg: Edema present.  Skin:    General: Skin is warm and dry.     Capillary Refill: Capillary refill takes less than 2 seconds.  Neurological:     General: No focal deficit present.     Mental Status: He is alert. Mental status is at baseline.  Psychiatric:        Mood and Affect: Mood normal.        Future Appointments  Date Time Provider [place]  05/16/2021 11:20 AM Larey Dresser, MD [facility] None     ACTION: Home visit completed

## 2021-04-03 ENCOUNTER — Telehealth (HOSPITAL_COMMUNITY): Payer: Self-pay

## 2021-04-03 NOTE — Telephone Encounter (Signed)
Charles Gray texted me this morning reporting weeping in his lower legs. He was having nurses come out to wrap and treat his legs but they haven't been out in several weeks. He is inquiring if they could come back out. I will forward this to HF triage. I plan to follow up in the home next week.

## 2021-04-03 NOTE — Telephone Encounter (Addendum)
Spoke with [facility] and was told pt was discharged 11/27.  Also Nira Conn reported pt c/o dizziness and swollen legs.  BP 130/70 HR 74. Pt missed metolazone Monday took it Tuesday. Pt submitted cardiomems today.   Routed to [name] Cleg,NP for advice

## 2021-04-03 NOTE — Telephone Encounter (Signed)
Spoke with Nira Conn and was told [facility] was wrapping pts legs but have not been out to see him for a few weeks. I will follow up with [facility] to see why visits were stopped and see they can resume care.

## 2021-04-06 ENCOUNTER — Encounter (HOSPITAL_COMMUNITY): Payer: Self-pay

## 2021-04-06 ENCOUNTER — Emergency Department (HOSPITAL_COMMUNITY): Payer: Medicare HMO

## 2021-04-06 ENCOUNTER — Other Ambulatory Visit: Payer: Self-pay

## 2021-04-06 ENCOUNTER — Inpatient Hospital Stay (HOSPITAL_COMMUNITY)
Admission: EM | Admit: 2021-04-06 | Discharge: 2021-04-09 | DRG: 441 | Disposition: A | Discharge status: Home / Self Care | Payer: Medicare HMO | Attending: Internal Medicine | Admitting: Internal Medicine

## 2021-04-06 DIAGNOSIS — D62 Acute posthemorrhagic anemia: Secondary | ICD-10-CM | POA: Diagnosis not present

## 2021-04-06 DIAGNOSIS — G894 Chronic pain syndrome: Secondary | ICD-10-CM | POA: Diagnosis present

## 2021-04-06 DIAGNOSIS — K7460 Unspecified cirrhosis of liver: Secondary | ICD-10-CM | POA: Diagnosis present

## 2021-04-06 DIAGNOSIS — Z86711 Personal history of pulmonary embolism: Secondary | ICD-10-CM | POA: Diagnosis not present

## 2021-04-06 DIAGNOSIS — K766 Portal hypertension: Secondary | ICD-10-CM | POA: Diagnosis present

## 2021-04-06 DIAGNOSIS — J449 Chronic obstructive pulmonary disease, unspecified: Secondary | ICD-10-CM | POA: Diagnosis present

## 2021-04-06 DIAGNOSIS — Z6841 Body Mass Index (BMI) 40.0 and over, adult: Secondary | ICD-10-CM

## 2021-04-06 DIAGNOSIS — Z83438 Family history of other disorder of lipoprotein metabolism and other lipidemia: Secondary | ICD-10-CM | POA: Diagnosis not present

## 2021-04-06 DIAGNOSIS — K760 Fatty (change of) liver, not elsewhere classified: Secondary | ICD-10-CM | POA: Diagnosis present

## 2021-04-06 DIAGNOSIS — K921 Melena: Secondary | ICD-10-CM

## 2021-04-06 DIAGNOSIS — Z7901 Long term (current) use of anticoagulants: Secondary | ICD-10-CM | POA: Diagnosis not present

## 2021-04-06 DIAGNOSIS — Z880 Allergy status to penicillin: Secondary | ICD-10-CM

## 2021-04-06 DIAGNOSIS — K922 Gastrointestinal hemorrhage, unspecified: Secondary | ICD-10-CM | POA: Diagnosis present

## 2021-04-06 DIAGNOSIS — T502X5A Adverse effect of carbonic-anhydrase inhibitors, benzothiadiazides and other diuretics, initial encounter: Secondary | ICD-10-CM | POA: Diagnosis present

## 2021-04-06 DIAGNOSIS — B353 Tinea pedis: Secondary | ICD-10-CM | POA: Diagnosis present

## 2021-04-06 DIAGNOSIS — E782 Mixed hyperlipidemia: Secondary | ICD-10-CM | POA: Diagnosis present

## 2021-04-06 DIAGNOSIS — I5032 Chronic diastolic (congestive) heart failure: Secondary | ICD-10-CM | POA: Diagnosis present

## 2021-04-06 DIAGNOSIS — I480 Paroxysmal atrial fibrillation: Secondary | ICD-10-CM | POA: Diagnosis present

## 2021-04-06 DIAGNOSIS — I872 Venous insufficiency (chronic) (peripheral): Secondary | ICD-10-CM | POA: Diagnosis present

## 2021-04-06 DIAGNOSIS — Z79899 Other long term (current) drug therapy: Secondary | ICD-10-CM

## 2021-04-06 DIAGNOSIS — Z823 Family history of stroke: Secondary | ICD-10-CM | POA: Diagnosis not present

## 2021-04-06 DIAGNOSIS — K2901 Acute gastritis with bleeding: Secondary | ICD-10-CM | POA: Diagnosis present

## 2021-04-06 DIAGNOSIS — K317 Polyp of stomach and duodenum: Secondary | ICD-10-CM | POA: Diagnosis present

## 2021-04-06 DIAGNOSIS — G4700 Insomnia, unspecified: Secondary | ICD-10-CM | POA: Diagnosis present

## 2021-04-06 DIAGNOSIS — F1110 Opioid abuse, uncomplicated: Secondary | ICD-10-CM | POA: Diagnosis present

## 2021-04-06 DIAGNOSIS — G4733 Obstructive sleep apnea (adult) (pediatric): Secondary | ICD-10-CM | POA: Diagnosis present

## 2021-04-06 DIAGNOSIS — K3189 Other diseases of stomach and duodenum: Secondary | ICD-10-CM | POA: Diagnosis not present

## 2021-04-06 DIAGNOSIS — I110 Hypertensive heart disease with heart failure: Secondary | ICD-10-CM | POA: Diagnosis present

## 2021-04-06 DIAGNOSIS — R161 Splenomegaly, not elsewhere classified: Secondary | ICD-10-CM | POA: Diagnosis not present

## 2021-04-06 DIAGNOSIS — Z8249 Family history of ischemic heart disease and other diseases of the circulatory system: Secondary | ICD-10-CM

## 2021-04-06 DIAGNOSIS — Z20822 Contact with and (suspected) exposure to covid-19: Secondary | ICD-10-CM | POA: Diagnosis present

## 2021-04-06 DIAGNOSIS — Z86718 Personal history of other venous thrombosis and embolism: Secondary | ICD-10-CM

## 2021-04-06 LAB — COMPREHENSIVE METABOLIC PANEL
ALT: 18 U/L (ref 0–44)
AST: 33 U/L (ref 15–41)
Albumin: 3.2 g/dL — ABNORMAL LOW (ref 3.5–5.0)
Alkaline Phosphatase: 91 U/L (ref 38–126)
Anion gap: 13 (ref 5–15)
BUN: 20 mg/dL (ref 8–23)
CO2: 27 mmol/L (ref 22–32)
Calcium: 8.8 mg/dL — ABNORMAL LOW (ref 8.9–10.3)
Chloride: 94 mmol/L — ABNORMAL LOW (ref 98–111)
Creatinine, Ser: 1 mg/dL (ref 0.61–1.24)
GFR, Estimated: >60 mL/min (ref >60)
Glucose, Bld: 145 mg/dL — ABNORMAL HIGH (ref 70–99)
Potassium: 3.2 mmol/L — ABNORMAL LOW (ref 3.5–5.1)
Sodium: 134 mmol/L — ABNORMAL LOW (ref 135–145)
Total Bilirubin: 0.7 mg/dL (ref 0.3–1.2)
Total Protein: 7.1 g/dL (ref 6.5–8.1)

## 2021-04-06 LAB — CBC WITH DIFFERENTIAL/PLATELET
Abs Immature Granulocytes: 0.05 10*3/uL (ref 0.00–0.07)
Basophils Absolute: 0 10*3/uL (ref 0.0–0.1)
Basophils Relative: 1 %
Eosinophils Absolute: 0.1 10*3/uL (ref 0.0–0.5)
Eosinophils Relative: 1 %
HCT: 32.6 % — ABNORMAL LOW (ref 39.0–52.0)
Hemoglobin: 9.7 g/dL — ABNORMAL LOW (ref 13.0–17.0)
Immature Granulocytes: 1 %
Lymphocytes Relative: 8 %
Lymphs Abs: 0.6 10*3/uL — ABNORMAL LOW (ref 0.7–4.0)
MCH: 23.1 pg — ABNORMAL LOW (ref 26.0–34.0)
MCHC: 29.8 g/dL — ABNORMAL LOW (ref 30.0–36.0)
MCV: 77.6 fL — ABNORMAL LOW (ref 80.0–100.0)
Monocytes Absolute: 0.6 10*3/uL (ref 0.1–1.0)
Monocytes Relative: 7 %
Neutro Abs: 6.2 10*3/uL (ref 1.7–7.7)
Neutrophils Relative %: 82 %
Platelets: 150 10*3/uL (ref 150–400)
RBC: 4.2 MIL/uL — ABNORMAL LOW (ref 4.22–5.81)
RDW: 16.8 % — ABNORMAL HIGH (ref 11.5–15.5)
WBC: 7.5 10*3/uL (ref 4.0–10.5)
nRBC: 0 % (ref 0.0–0.2)

## 2021-04-06 LAB — PROTIME-INR
INR: 1.3 — ABNORMAL HIGH (ref 0.8–1.2)
Prothrombin Time: 15.8 seconds — ABNORMAL HIGH (ref 11.4–15.2)

## 2021-04-06 LAB — RESP PANEL BY RT-PCR (FLU A&B, COVID) ARPGX2
Influenza A by PCR: NEGATIVE
Influenza B by PCR: NEGATIVE
SARS Coronavirus 2 by RT PCR: NEGATIVE

## 2021-04-06 LAB — TYPE AND SCREEN
ABO/RH(D): O POS
Antibody Screen: NEGATIVE

## 2021-04-06 LAB — TROPONIN I (HIGH SENSITIVITY)
Troponin I (High Sensitivity): 6 ng/L (ref <18)
Troponin I (High Sensitivity): 8 ng/L (ref <18)

## 2021-04-06 LAB — LACTIC ACID, PLASMA: Lactic Acid, Venous: 2.6 mmol/L (ref 0.5–1.9)

## 2021-04-06 LAB — BRAIN NATRIURETIC PEPTIDE: B Natriuretic Peptide: 82.2 pg/mL (ref 0.0–100.0)

## 2021-04-06 LAB — POC OCCULT BLOOD, ED: Fecal Occult Bld: POSITIVE — AB

## 2021-04-06 MED ORDER — SPIRONOLACTONE 25 MG PO TABS
100.0000 mg | ORAL_TABLET | Freq: Every day | ORAL | Status: DC
  Administered 2021-04-06 – 2021-04-09 (×4): 100 mg via ORAL
  Filled 2021-04-06 (×4): qty 4

## 2021-04-06 MED ORDER — PANTOPRAZOLE SODIUM 40 MG IV SOLR
40.0000 mg | Freq: Two times a day (BID) | INTRAVENOUS | Status: DC
  Administered 2021-04-07 – 2021-04-08 (×4): 40 mg via INTRAVENOUS
  Filled 2021-04-06 (×4): qty 40

## 2021-04-06 MED ORDER — METOPROLOL TARTRATE 12.5 MG HALF TABLET
12.5000 mg | ORAL_TABLET | Freq: Two times a day (BID) | ORAL | Status: DC
  Administered 2021-04-07 – 2021-04-09 (×5): 12.5 mg via ORAL
  Filled 2021-04-06 (×5): qty 1

## 2021-04-06 MED ORDER — SERTRALINE HCL 50 MG PO TABS
50.0000 mg | ORAL_TABLET | Freq: Every day | ORAL | Status: DC
  Administered 2021-04-07 – 2021-04-09 (×3): 50 mg via ORAL
  Filled 2021-04-06 (×3): qty 1

## 2021-04-06 MED ORDER — METOLAZONE 2.5 MG PO TABS
2.5000 mg | ORAL_TABLET | ORAL | Status: DC
  Administered 2021-04-09: 11:00:00 2.5 mg via ORAL
  Filled 2021-04-06: qty 1

## 2021-04-06 MED ORDER — TORSEMIDE 100 MG PO TABS
100.0000 mg | ORAL_TABLET | Freq: Two times a day (BID) | ORAL | Status: DC
  Administered 2021-04-06 – 2021-04-09 (×5): 100 mg via ORAL
  Filled 2021-04-06 (×5): qty 1

## 2021-04-06 MED ORDER — LACTULOSE 10 GM/15ML PO SOLN
10.0000 g | Freq: Three times a day (TID) | ORAL | Status: DC
  Administered 2021-04-06 – 2021-04-09 (×8): 10 g via ORAL
  Filled 2021-04-06 (×8): qty 15

## 2021-04-06 MED ORDER — PANTOPRAZOLE SODIUM 40 MG IV SOLR
80.0000 mg | Freq: Once | INTRAVENOUS | Status: AC
  Administered 2021-04-06: 17:00:00 80 mg via INTRAVENOUS
  Filled 2021-04-06: qty 80

## 2021-04-06 MED ORDER — TAMSULOSIN HCL 0.4 MG PO CAPS
0.4000 mg | ORAL_CAPSULE | Freq: Every day | ORAL | Status: DC
  Administered 2021-04-06 – 2021-04-09 (×4): 0.4 mg via ORAL
  Filled 2021-04-06 (×4): qty 1

## 2021-04-06 MED ORDER — GABAPENTIN 800 MG PO TABS
800.0000 mg | ORAL_TABLET | Freq: Three times a day (TID) | ORAL | Status: DC
  Filled 2021-04-06: qty 1

## 2021-04-06 MED ORDER — ALBUTEROL SULFATE HFA 108 (90 BASE) MCG/ACT IN AERS: 2.0000 | INHALATION_SPRAY | RESPIRATORY_TRACT | Status: DC | PRN

## 2021-04-06 MED ORDER — SODIUM CHLORIDE 0.9 % IV SOLN
50.0000 ug/h | INTRAVENOUS | Status: DC
  Administered 2021-04-06 – 2021-04-08 (×4): 50 ug/h via INTRAVENOUS
  Filled 2021-04-06 (×6): qty 1

## 2021-04-06 MED ORDER — PRAVASTATIN SODIUM 10 MG PO TABS
20.0000 mg | ORAL_TABLET | Freq: Every day | ORAL | Status: DC
  Administered 2021-04-06 – 2021-04-09 (×4): 20 mg via ORAL
  Filled 2021-04-06 (×4): qty 2

## 2021-04-06 MED ORDER — BUPRENORPHINE HCL-NALOXONE HCL 8-2 MG SL SUBL
1.0000 | SUBLINGUAL_TABLET | Freq: Two times a day (BID) | SUBLINGUAL | Status: DC
  Administered 2021-04-06 – 2021-04-09 (×6): 1 via SUBLINGUAL
  Filled 2021-04-06 (×6): qty 1

## 2021-04-06 MED ORDER — ALBUTEROL SULFATE (2.5 MG/3ML) 0.083% IN NEBU: 2.5000 mg | INHALATION_SOLUTION | RESPIRATORY_TRACT | Status: DC | PRN

## 2021-04-06 MED ORDER — ZOLPIDEM TARTRATE 5 MG PO TABS
10.0000 mg | ORAL_TABLET | Freq: Every evening | ORAL | Status: DC | PRN
  Administered 2021-04-06 – 2021-04-08 (×3): 10 mg via ORAL
  Filled 2021-04-06 (×3): qty 2

## 2021-04-06 MED ORDER — SODIUM CHLORIDE 0.9 % IV SOLN
1.0000 g | Freq: Once | INTRAVENOUS | Status: AC
  Administered 2021-04-06: 17:00:00 1 g via INTRAVENOUS
  Filled 2021-04-06: qty 10

## 2021-04-06 MED ORDER — GABAPENTIN 400 MG PO CAPS
800.0000 mg | ORAL_CAPSULE | Freq: Three times a day (TID) | ORAL | Status: DC
  Administered 2021-04-06 – 2021-04-09 (×8): 800 mg via ORAL
  Filled 2021-04-06 (×8): qty 2

## 2021-04-06 MED ORDER — OCTREOTIDE LOAD VIA INFUSION
50.0000 ug | Freq: Once | INTRAVENOUS | Status: AC
  Administered 2021-04-06: 18:00:00 50 ug via INTRAVENOUS
  Filled 2021-04-06: qty 25

## 2021-04-06 NOTE — Plan of Care (Signed)

## 2021-04-06 NOTE — ED Notes (Signed)
MD at bedside. 

## 2021-04-06 NOTE — ED Notes (Signed)
Patient transported to X-ray 

## 2021-04-06 NOTE — ED Notes (Signed)
Pt returned from X-ray.  

## 2021-04-06 NOTE — ED Provider Notes (Signed)
Emergency Medicine Provider Triage Evaluation Note  Charles Gray , a 65 y.o. male  was evaluated in triage.  Pt complains of melena, leg swelling, and shortness of breath with intermittent chest pain. States that chest pain and shortness of breath have been intermittent over the past few weeks with worsening bilateral lower extremity edema and abdominal swelling. States that he did not take his diuretic today because he was afraid he would have to pee in the ambulance but denies any other missed doses previously.  Additionally started having melena yesterday, is on Elliquis. States that his stools are dark and tarry but 'when you stir it around it turns bright red.' Hx of GI bleed in the past requiring transfusion.  Review of Systems  Positive:  Negative: See above  Physical Exam  BP 130/73 (BP Location: Right Arm)    Pulse 73    Temp 98.6 F (37 C) (Oral)    Resp 20    Ht 6' 3"  (1.905 m)    Wt (!) 170.1 kg    SpO2 97%    BMI 46.87 kg/m  Gen:   Awake, no distress   Resp:  Normal effort MSK:   Moves extremities without difficulty  Other:  4+ bilateral lower extremity edema that is erythematous and weeping.  Medical Decision Making  Medically screening exam initiated at 2:55 PM.  Appropriate orders placed.  Virgle Arth was informed that the remainder of the evaluation will be completed by another provider, this initial triage assessment does not replace that evaluation, and the importance of remaining in the ED until their evaluation is complete.     Nestor Lewandowsky 04/06/21 1501    Wyvonnia Dusky, MD 04/06/21 2012

## 2021-04-06 NOTE — H&P (Signed)
Date: 04/06/2021               Patient Name:  Charles Gray MRN: [id number]  DOB: [birth year] Age / Sex: 65 y.o., male   PCP: Enid Skeens., MD         Medical Service: Internal Medicine Teaching Service         Attending Physician: Dr. Jimmye Norman, Elaina Pattee, MD    First Contact: Dr. Raymondo Band Pager: [phone]  Second Contact: Dr. Lisabeth Devoid Pager: [phone]       After Hours (After 5p/  First Contact Pager: [phone]  weekends / holidays): Second Contact Pager: [phone]   Chief Complaint: bloody bowel movements  History of Present Illness: Charles Gray is a 65 yo male with chronic CHF, Afib on eliquis, s/p cardiomems device, HTN, liver cirrhosis, HLD, previous GI bleed, and OSA. He presented with a chief complaint of dark, bloody stools that he states started yesterday. He had an episode of a cranberry colored stool yesterday, and most recently again today. He also endorses some abdominal pain that is worse in his RUQ that started around the same time as his other symptoms. He also felt like he was going to pass out today, but denies any syncopal events. The patient has a history of a GI Bleed in 2020- EGD at that time showed mild portal hypertensive gastropathy and a small, nonbleeding gastric ulcer, with no varices noted. He takes protonix twice a day regularly and has not missed any doses. Patient also denies any recent alcohol or NSAID use. He denies any recent fevers, chills, chest pain, shortness of breath at rest, vomiting, diarrhea, dysuria, or hematuria. He does endorse nausea and dry heaving, although this is improved. Patient also endorses orthopnea and significant dyspnea on exertion- however, this has been present for years. The patient states that he is not able to walk the length of his driveway with his walker, without feeling extremely short of breath. He also has slept in a recliner for years. He notes that he typically weighs around 360-370 lbs, but weighed himself at home  today and was around 394 lbs (weight in ED today 370 lbs). The patient has noticed an increase in his lower extremity swelling, that has worsened over the last couple of weeks. He states that the redness and dryness of his legs has also worsened over that time period.   Meds:  Eliquis 5 mg bid Aspirin 325 mg Suboxone 8-2 mg Eplerenone 50 mg Lactulose tid Metolazone 2.5 mg three times weekly Metoprolol 25 mg bid Protonix 40 mg bid Pravastatin 20 mg Flomax 0.4 mg Zoloft 50 mg Torsemide 100 mg bid Ambien 10 mg qhs prn  Current Facility-Administered Medications for the 04/06/21 encounter [facility] Encounter)  Medication   aspirin tablet 325 mg   No outpatient medications have been marked as taking for the 04/06/21 encounter [facility] Encounter).     Allergies: Allergies as of 04/06/2021 - Review Complete 04/06/2021  Allergen Reaction Noted   Penicillins  01/24/2019   Past Medical History:  Diagnosis Date   Acute on chronic congestive heart failure (HCC)    Acute on chronic diastolic CHF (congestive heart failure) ([place]) 08/23/2019   Acute on chronic right-sided heart failure ([place]) 08/06/2019   Acute respiratory failure with hypoxia (HCC)    Anemia of chronic disease    Atrial fibrillation ([place]) 12/23/2019   Atrial tachycardia ([place]) 08/12/2019   Benign essential HTN    Chest pain 08/23/2019   Chronic anticoagulation 12/26/2019  Chronic heart failure with preserved EF 05/30/2019   Echo 07/2019: EF 55-60, GR 1 DD   Chronic pain syndrome    Cirrhosis of liver without ascites (HCC)    Class 2 severe obesity due to excess calories with serious comorbidity and body mass index (BMI) of 39.0 to 39.9 in adult ([place]) 05/30/2019   Debility 03/13/2019   Drug induced constipation    Dyspnea    Fatty liver disease, nonalcoholic    GIB (gastrointestinal bleeding) 03/04/2019   Heart failure, systolic, acute ([place]) 0/37/0488   History of pulmonary embolism 08/12/2019   Hyperlipidemia 01/24/2019    Hypertension 01/24/2019   Hypoalbuminemia due to protein-calorie malnutrition (HCC)    Hypokalemia    Hyponatremia    Hypotension due to drugs    Insomnia 01/24/2019   Lactic acidosis 08/23/2019   Lumbar disc herniation 01/24/2019   Mixed hyperlipidemia 01/24/2019   Morbid obesity with BMI of 40.0-44.9, adult ([place]) 05/30/2019   Normocytic anemia 06/08/2019   Occasional tremors 08/12/2019   OSA (obstructive sleep apnea) 08/12/2019   Palpitations 12/26/2019   Peripheral edema    Personal history of DVT (deep vein thrombosis) 12/26/2019   Portal hypertensive gastropathy ([place]) 03/06/2019   Seen on EGD 03/06/2019   Pulmonary emboli ([place]) 06/08/2019   Pulmonary embolism ([place]) 06/08/2019   Redness and swelling of lower leg 08/23/2019   Shock ([place])    Thrombocytopenia ([place])     Family History: HTN, HLD, CAD  Social History: Lives by himself in [place] with his cat.  Has an aid that assists him with cleaning, cooking, and bathing 5 days/week- the other 2 days of the week the aid either prepares enough food for him, or he eats what he can find. PCP is Dr. Wendie Agreste in [place]  Denies any alcohol, tobacco, or illicit drug use  Review of Systems: A complete ROS was negative except as per HPI.   Physical Exam: Blood pressure 108/61, pulse 70, temperature 98.6 F (37 C), temperature source Oral, resp. rate 14, height 6' 3"  (1.905 m), weight (!) 170.1 kg, SpO2 98 %. Physical Exam Constitutional:      General: He is not in acute distress.    Appearance: He is obese.  HENT:     Head: Normocephalic and atraumatic.  Eyes:     Extraocular Movements: Extraocular movements intact.     Pupils: Pupils are equal, round, and reactive to light.  Cardiovascular:     Rate and Rhythm: Normal rate and regular rhythm.     Pulses: Normal pulses.     Heart sounds: No murmur heard. Pulmonary:     Effort: Pulmonary effort is normal. No respiratory distress.     Breath sounds: Normal breath sounds. No  wheezing, rhonchi or rales.  Abdominal:     General: Bowel sounds are normal. There is distension.     Palpations: Abdomen is soft.     Tenderness: There is abdominal tenderness. There is guarding.     Comments: RUQ guarding and tenderness to palpation   Musculoskeletal:        General: Tenderness present.     Right lower leg: Edema present.     Left lower leg: Edema present.     Comments: 2-3+ bilateral lower extremity pitting edema extending up to thighs Unable to palpate DP pulses 2/2 to edema in feet  Skin:    General: Skin is warm.     Comments: Extensive venous stasis dermatitis bilateral lower extremities Tinea pedia bilaterally and thickened toenails  Neurological:     General: No focal deficit present.     Mental Status: He is alert and oriented to person, place, and time. Mental status is at baseline.     Cranial Nerves: No cranial nerve deficit.  Psychiatric:        Mood and Affect: Mood normal.        Behavior: Behavior normal.     EKG: personally reviewed my interpretation is sinus rhythm  CXR: personally reviewed my interpretation is mild increased bibasilar interstitial markings compared to September CXR  CBC    Component Value Date/Time   WBC 7.5 04/06/2021 1454   RBC 4.20 (L) 04/06/2021 1454   HGB 9.7 (L) 04/06/2021 1454   HGB 13.0 03/06/2020 1509   HCT 32.6 (L) 04/06/2021 1454   HCT 40.4 03/06/2020 1509   PLT 150 04/06/2021 1454   PLT 179 03/06/2020 1509   MCV 77.6 (L) 04/06/2021 1454   MCV 79 03/06/2020 1509   MCH 23.1 (L) 04/06/2021 1454   MCHC 29.8 (L) 04/06/2021 1454   RDW 16.8 (H) 04/06/2021 1454   RDW 15.7 (H) 03/06/2020 1509   LYMPHSABS 0.6 (L) 04/06/2021 1454   MONOABS 0.6 04/06/2021 1454   EOSABS 0.1 04/06/2021 1454   BASOSABS 0.0 04/06/2021 1454    CMP     Component Value Date/Time   NA 134 (L) 04/06/2021 1454   NA 135 03/06/2020 1509   K 3.2 (L) 04/06/2021 1454   CL 94 (L) 04/06/2021 1454   CO2 27 04/06/2021 1454   GLUCOSE 145  (H) 04/06/2021 1454   BUN 20 04/06/2021 1454   BUN 20 03/06/2020 1509   CREATININE 1.00 04/06/2021 1454   CALCIUM 8.8 (L) 04/06/2021 1454   PROT 7.1 04/06/2021 1454   PROT 7.4 01/24/2019 1121   ALBUMIN 3.2 (L) 04/06/2021 1454   ALBUMIN 4.5 01/24/2019 1121   AST 33 04/06/2021 1454   ALT 18 04/06/2021 1454   ALKPHOS 91 04/06/2021 1454   BILITOT 0.7 04/06/2021 1454   BILITOT 0.6 01/24/2019 1121   GFRNONAA >60 04/06/2021 1454   GFRAA 82 03/06/2020 1509     Assessment & Plan by Problem: Principal Problem:   GI bleed  #Upper GI bleed Patient presented with multiple episodes of dark bowel movements and abdominal pain. He has a hx of GI bleed that required multiple blood transfusions and gastritis/gastric ulcer as noted on EGD in 2020 and has been taking protonix bid. He had no evidence of varices seen on last EGD. Hb on admission 9.7, which is a drop from 11.7 (most recent Hb). GI was consulted from the ED and recommended the patient be started on octreotide and PPI. Received IV Protonix 80 mg in ED - GI consulted, appreciate recs - Continue IV octreotide - Continue IV protonix 40 mg bid  - Ultrasound abdomen complete  - Clear liquid diet, NPO at midnight for possible procedure? - Daily CBC and CMP - Transfuse if Hb <7  #Paroxysmal Afib on Eliquis Patient had a brief episode where he went into Afib with RVR in the ED, however, he reverted back to normal sinus rhythm and HR was in the 60s upon evaluation. Will hold his Eliquis in the setting of GI bleed. - Cardiac monitoring - Hold eliquis in setting of bleed - Reduce home metoprolol to 12.5 mg bid starting tomorrow  #Chronic HFpEF Last echo in July with EF of 60-65%. Last RHC in August with normal filling pressures and he had a Cardiomems pressure sensor  placed at that time. Has had multiple hospitalizations for acute on chronic HF in the past. Patient has chronic orthopnea and dyspnea on exertion at his baseline, which appears to be  stable. He does report an increase in his lower extremity swelling and a subjective increase in weight, although weight is 370 lbs in the ED, which is his approximate dry weight. On Metoprolol succinate 25 mg bid, torsemide 100 mg bid, eplerenone 100 mg, and metolazone 2.5 mg MWF at home.  - Strict I&Os and daily weights - Will restart metoprolol at 12.5 mg bid tomorrow, will hold today in setting of bradycardia - Resume eplerenone, torsemide, and metolazone   #Cirrhosis  AST/ALT within normal limits, although patient has significant RUQ tenderness to palpation. On lactulose tid at home, although patient reports he has only been having 1 bowel movement per day. Has not had any imaging evidence of cirrhosis, but may have been started on lactulose 2/2 to elevated ammonia level. MELD score 14, low risk for 90d mortality.  - Continue lactulose - Abdominal ultrasound as above  #Chronic venous stasis dermatitis bilateral lower extremities #Tinea pedis Likely secondary to venous insuffiencey. Does not appear to be actively infected, patient is afebrile, and it would be unusual to have bilateral cellulitis. Unable to palpate lower extremity pulses secondary to his swelling- dopplers showed faint pulses bilaterally/ - Consider wound care consult - Unna boots - Consider podiatry consult as an outpatient  #Hx of opioid use disorder Secondary to prescription opioids.  - Continue suboxone  #Hx PE/DVT On eliquis as noted above.  #OSA not on CPAP Reportedly cannot tolerate CPAP.   Best practices: Code: Full Fluids: none VTE: SCDs Diet: Clear liquid, NPO at midnight  Dispo: Admit patient to Inpatient with expected length of stay greater than 2 midnights.  SignedDorethea Clan, DO 04/06/2021, 5:59 PM  Pager: [phone] After 5pm on weekdays and 1pm on weekends: On Call pager: [phone]

## 2021-04-06 NOTE — ED Triage Notes (Signed)
Pt [facility] from home c/o dark stools since yesterday. Pt is also c/o of leg swelling and stated "but he didn't take his lasix because it makes me pee".    140/90 86 [phone]

## 2021-04-06 NOTE — ED Provider Notes (Addendum)
[facility] EMERGENCY DEPARTMENT Provider Note   CSN: [id number] Arrival date & time: 04/06/21  1412     History Chief Complaint  Patient presents with   Melena    Charles Gray is a 65 y.o. male.  HPI     65 y.o. male with medical history significant for OSA with CPAP intolerance, chronic pain, depression, anxiety, insomnia, PAF on Eliquis, history of DVT and PE, ? cirrhosis, morbid obesity, chronic diastolic CHF, COPD.  Patient arrives to the ER with a chief complaint of bloody stools.  He started noticing some bloody stools yesterday.  He has some discomfort over his abdomen.  He reports an episode of cranberry colored looking stool yesterday and then again today.  No melena.  He has history of GI bleed which required resuscitation.   Patient denies any body aches, fevers, chills.  He also denies any chest pain, shortness of breath, cough.  Does indicate that his leg swelling is worse than usual. Past Medical History:  Diagnosis Date   Acute on chronic congestive heart failure (HCC)    Acute on chronic diastolic CHF (congestive heart failure) ([place]) 08/23/2019   Acute on chronic right-sided heart failure ([place]) 08/06/2019   Acute respiratory failure with hypoxia (HCC)    Anemia of chronic disease    Atrial fibrillation ([place]) 12/23/2019   Atrial tachycardia ([place]) 08/12/2019   Benign essential HTN    Chest pain 08/23/2019   Chronic anticoagulation 12/26/2019   Chronic heart failure with preserved EF 05/30/2019   Echo 07/2019: EF 55-60, GR 1 DD   Chronic pain syndrome    Cirrhosis of liver without ascites ([place])    Class 2 severe obesity due to excess calories with serious comorbidity and body mass index (BMI) of 39.0 to 39.9 in adult ([place]) 05/30/2019   Debility 03/13/2019   Drug induced constipation    Dyspnea    Fatty liver disease, nonalcoholic    GIB (gastrointestinal bleeding) 03/04/2019   Heart failure, systolic, acute ([place]) 1/91/4782   History of  pulmonary embolism 08/12/2019   Hyperlipidemia 01/24/2019   Hypertension 01/24/2019   Hypoalbuminemia due to protein-calorie malnutrition (HCC)    Hypokalemia    Hyponatremia    Hypotension due to drugs    Insomnia 01/24/2019   Lactic acidosis 08/23/2019   Lumbar disc herniation 01/24/2019   Mixed hyperlipidemia 01/24/2019   Morbid obesity with BMI of 40.0-44.9, adult ([place]) 05/30/2019   Normocytic anemia 06/08/2019   Occasional tremors 08/12/2019   OSA (obstructive sleep apnea) 08/12/2019   Palpitations 12/26/2019   Peripheral edema    Personal history of DVT (deep vein thrombosis) 12/26/2019   Portal hypertensive gastropathy ([place]) 03/06/2019   Seen on EGD 03/06/2019   Pulmonary emboli ([place]) 06/08/2019   Pulmonary embolism ([place]) 06/08/2019   Redness and swelling of lower leg 08/23/2019   Shock ([place])    Thrombocytopenia ([place])     Patient Active Problem List   Diagnosis Date Noted   Acute encephalopathy 12/21/2020   COPD (chronic obstructive pulmonary disease) ([place]) 12/21/2020   Chronic diastolic (congestive) heart failure ([place]) 11/14/2020   Syncope 10/28/2020   CHF (congestive heart failure) ([place]) 10/28/2020   Chronic venous stasis dermatitis of both lower extremities 08/27/2020   Cellulitis of both lower extremities 08/14/2020   Microcytic anemia 08/14/2020   GERD without esophagitis 06/10/2020   Atrial fibrillation with rapid ventricular response ([place]) 03/24/2020   Acute on chronic right heart failure ([place]) 01/27/2020   Acute respiratory failure with  hypoxia ([place])    Fatty liver disease, nonalcoholic    Shock ([place])    Personal history of DVT (deep vein thrombosis) 12/26/2019   Chronic anticoagulation 12/26/2019   Palpitations 12/26/2019   AF (paroxysmal atrial fibrillation) ([place]) 12/23/2019   Heart failure, systolic, acute ([place]) 65/78/4696   Acute on chronic congestive heart failure (HCC)    Chronic diastolic HF (heart failure) ([place]) 08/23/2019   Chest pain 08/23/2019   Lactic  acidosis 08/23/2019   History of pulmonary embolism 08/12/2019   Atrial tachycardia ([place]) 08/12/2019   Occasional tremors 08/12/2019   OSA (obstructive sleep apnea) 08/12/2019   Acute on chronic right-sided heart failure ([place]) 08/06/2019   Pulmonary embolism ([place]) 06/08/2019   Normocytic anemia 06/08/2019   Pulmonary emboli ([place]) 06/08/2019   Chronic heart failure with preserved ejection fraction ([place]) 05/30/2019   Class 2 severe obesity due to excess calories with serious comorbidity and body mass index (BMI) of 39.0 to 39.9 in adult ([place]) 05/30/2019   Morbid obesity with BMI of 40.0-44.9, adult ([place]) 05/30/2019   Hyponatremia    Drug induced constipation    Peripheral edema    Hypotension due to drugs    Hypoalbuminemia due to protein-calorie malnutrition (HCC)    Hypokalemia    Thrombocytopenia (HCC)    Anemia of chronic disease    Cirrhosis of liver without ascites ([place])    Chronic pain syndrome    Debility 03/13/2019   Portal hypertensive gastropathy ([place]) 03/06/2019   Dyspnea    GIB (gastrointestinal bleeding) 03/04/2019   Mixed hyperlipidemia 01/24/2019   Insomnia 01/24/2019   Hyperlipidemia 01/24/2019   Essential hypertension 01/24/2019   Lumbar disc herniation 01/24/2019    Past Surgical History:  Procedure Laterality Date   ESOPHAGOGASTRODUODENOSCOPY (EGD) WITH PROPOFOL N/A 03/05/2019   Procedure: ESOPHAGOGASTRODUODENOSCOPY (EGD) WITH PROPOFOL;  Surgeon: Doran Stabler, MD;  Location: [facility];  Service: Gastroenterology;  Laterality: N/A;  Large Blood Clot Removed   ESOPHAGOGASTRODUODENOSCOPY (EGD) WITH PROPOFOL N/A 03/06/2019   Procedure: ESOPHAGOGASTRODUODENOSCOPY (EGD) WITH PROPOFOL;  Surgeon: Thornton Park, MD;  Location: [facility];  Service: Gastroenterology;  Laterality: N/A;   HEMORROIDECTOMY     PRESSURE SENSOR/CARDIOMEMS N/A 11/14/2020   Procedure: PRESSURE SENSOR/CARDIOMEMS;  Surgeon: Larey Dresser, MD;  Location: [facility];   Service: Cardiovascular;  Laterality: N/A;   RIGHT HEART CATH N/A 08/09/2019   Procedure: RIGHT HEART CATH;  Surgeon: Larey Dresser, MD;  Location: [facility];  Service: Cardiovascular;  Laterality: N/A;   RIGHT HEART CATH N/A 11/01/2020   Procedure: RIGHT HEART CATH;  Surgeon: Larey Dresser, MD;  Location: [facility];  Service: Cardiovascular;  Laterality: N/A;   RIGHT HEART CATH N/A 11/14/2020   Procedure: RIGHT HEART CATH;  Surgeon: Larey Dresser, MD;  Location: [facility];  Service: Cardiovascular;  Laterality: N/A;   RIGHT/LEFT HEART CATH AND CORONARY ANGIOGRAPHY N/A 05/10/2020   Procedure: RIGHT/LEFT HEART CATH AND CORONARY ANGIOGRAPHY;  Surgeon: Larey Dresser, MD;  Location: [facility];  Service: Cardiovascular;  Laterality: N/A;       Family History  Problem Relation Age of Onset   Hyperlipidemia Mother    Hypertension Mother    Stroke Mother    CAD Maternal Grandmother    CAD Maternal Grandfather     Social History   Tobacco Use   Smoking status: Never   Smokeless tobacco: Never  Vaping Use   Vaping Use: Never used  Substance  Use Topics   Alcohol use: Not Currently   Drug use: Not Currently    Types: Marijuana    Comment: Smoked for 30 years    Home Medications Prior to Admission medications   Medication Sig Start Date End Date Taking? Authorizing Provider  acetaminophen (TYLENOL) 500 MG tablet Take 1,000 mg by mouth every 6 (six) hours as needed for moderate pain or headache.    [provider]  apixaban (ELIQUIS) 5 MG TABS tablet Take 1 tablet (5 mg total) by mouth 2 (two) times daily. 11/15/20   Joette Catching, PA-C  Buprenorphine HCl-Naloxone HCl 8-2 MG FILM Place 1 tablet under the tongue 2 (two) times daily.    [provider]  eplerenone (INSPRA) 50 MG tablet TAKE 2 TABLETS BY MOUTH DAILY 03/26/21   Larey Dresser, MD  gabapentin (NEURONTIN) 800 MG tablet Take 1 tablet (800 mg total) by mouth 3  (three) times daily. 03/23/19   Angiulli, Lavon Paganini, PA-C  lactulose (CHRONULAC) 10 GM/15ML solution Take 15 mLs (10 g total) by mouth 3 (three) times daily. 12/23/20   Shelly Coss, MD  metolazone (ZAROXOLYN) 2.5 MG tablet Take 1 tablet (2.5 mg total) by mouth 3 (three) times a week. Monday, Wednesday and Fridays 11/15/20   Joette Catching, PA-C  metoprolol succinate (TOPROL XL) 25 MG 24 hr tablet Take 1 tablet (25 mg total) by mouth 2 (two) times daily. 11/02/20 02/05/22  Alma Friendly, MD  ondansetron (ZOFRAN-ODT) 4 MG disintegrating tablet TAKE 1 TABLET BY MOUTH EVERY 8 HOURS AS NEEDED FOR NAUSEA OR VOMITING 01/07/21   Larey Dresser, MD  pantoprazole (PROTONIX) 40 MG tablet Take 40 mg by mouth 2 (two) times daily.    [provider]  polyethylene glycol (MIRALAX / GLYCOLAX) 17 g packet Take 17 g by mouth as needed.    [provider]  potassium chloride SA (KLOR-CON M) 20 MEQ tablet TAKE 5 TABLETS BY MOUTH TWICE DAILY. EXTRA 40 MEQ WITH METOLAZONE DAYS. 03/26/21   Bensimhon, Shaune Pascal, MD  pravastatin (PRAVACHOL) 20 MG tablet Take 1 tablet (20 mg total) by mouth daily. 11/15/20   Joette Catching, PA-C  sertraline (ZOLOFT) 50 MG tablet Take 1 tablet (50 mg total) by mouth daily. 02/05/21   [place], Maricela Bo, FNP  sodium chloride (OCEAN) 0.65 % SOLN nasal spray Place 1 spray into both nostrils as needed for congestion.    [provider]  tamsulosin (FLOMAX) 0.4 MG CAPS capsule Take 1 capsule (0.4 mg total) by mouth daily. 11/15/20   Joette Catching, PA-C  torsemide (DEMADEX) 100 MG tablet Take 1 tablet (100 mg total) by mouth 2 (two) times daily. 09/26/20   [place], Suann Larry, MD  triamcinolone (KENALOG) 0.025 % ointment Apply 1 application topically 2 (two) times daily. Patient taking differently: Apply 1 application topically 2 (two) times daily. As needed 02/06/21   Vu, Rockey Situ, MD  VENTOLIN HFA 108 (90 Base) MCG/ACT inhaler Inhale 1 puff into  the lungs every 4 (four) hours as needed for shortness of breath. 03/23/19   Angiulli, Lavon Paganini, PA-C  zolpidem (AMBIEN) 10 MG tablet Take 1 tablet (10 mg total) by mouth at bedtime as needed for sleep. 12/23/20   Shelly Coss, MD  bumetanide (BUMEX) 1 MG tablet Take 4 tablets (4 mg total) by mouth daily with breakfast AND 3 tablets (3 mg total) every evening. 05/10/20 06/13/20  Larey Dresser, MD  metoprolol tartrate (LOPRESSOR) 50 MG tablet  Take 1 tablet (50 mg total) by mouth 2 (two) times daily. 02/09/20 04/11/20  Mercy Riding, MD    Allergies    Penicillins  Review of Systems   Review of Systems  Constitutional:  Positive for activity change.  Respiratory:  Negative for shortness of breath.   Cardiovascular:  Negative for chest pain.  Gastrointestinal:  Positive for abdominal distention and blood in stool. Negative for nausea and vomiting.  Neurological:  Negative for dizziness.  Hematological:  Bruises/bleeds easily.  All other systems reviewed and are negative.  Physical Exam Updated Vital Signs BP 99/67    Pulse 77    Temp 98.6 F (37 C) (Oral)    Resp 13    Ht 6' 3"  (1.905 m)    Wt (!) 170.1 kg    SpO2 99%    BMI 46.87 kg/m   Physical Exam Vitals and nursing note reviewed.  Constitutional:      Appearance: He is well-developed.  HENT:     Head: Atraumatic.  Cardiovascular:     Rate and Rhythm: Normal rate.  Pulmonary:     Effort: Pulmonary effort is normal.  Abdominal:     Tenderness: There is no abdominal tenderness.     Comments: Bright red blood per rectum  Musculoskeletal:        General: No swelling.     Cervical back: Neck supple.  Skin:    General: Skin is warm.  Neurological:     Mental Status: He is alert and oriented to person, place, and time.    ED Results / Procedures / Treatments   Labs (all labs ordered are listed, but only abnormal results are displayed) Labs Reviewed  CBC WITH DIFFERENTIAL/PLATELET - Abnormal; Notable for the following  components:      Result Value   RBC 4.20 (*)    Hemoglobin 9.7 (*)    HCT 32.6 (*)    MCV 77.6 (*)    MCH 23.1 (*)    MCHC 29.8 (*)    RDW 16.8 (*)    Lymphs Abs 0.6 (*)    All other components within normal limits  COMPREHENSIVE METABOLIC PANEL - Abnormal; Notable for the following components:   Sodium 134 (*)    Potassium 3.2 (*)    Chloride 94 (*)    Glucose, Bld 145 (*)    Calcium 8.8 (*)    Albumin 3.2 (*)    All other components within normal limits  POC OCCULT BLOOD, ED - Abnormal; Notable for the following components:   Fecal Occult Bld POSITIVE (*)    All other components within normal limits  RESP PANEL BY RT-PCR (FLU A&B, COVID) ARPGX2  BRAIN NATRIURETIC PEPTIDE  PROTIME-INR  LACTIC ACID, PLASMA  TYPE AND SCREEN  TROPONIN I (HIGH SENSITIVITY)  TROPONIN I (HIGH SENSITIVITY)    EKG None  Radiology DG Chest 2 View  Result Date: 04/06/2021 CLINICAL DATA:  Chest pain, shortness of breath EXAM: CHEST - 2 VIEW COMPARISON:  02/06/2021 FINDINGS: Stable cardiomediastinal contours. Mildly increased bibasilar interstitial markings. No lobar consolidation. No pleural effusion or pneumothorax. IMPRESSION: Mildly increased bibasilar interstitial markings could reflect bronchitic type lung changes versus atypical infection. Electronically Signed   By: Davina Poke D.O.   On: 04/06/2021 15:23    Procedures .Critical Care Performed by: Varney Biles, MD Authorized by: Varney Biles, MD   Critical care provider statement:    Critical care time (minutes):  32   Critical care was necessary to treat  or prevent imminent or life-threatening deterioration of the following conditions: Acute GI bleed.   Critical care was time spent personally by me on the following activities:  Development of treatment plan with patient or surrogate, discussions with consultants, evaluation of patient's response to treatment, examination of patient, ordering and review of laboratory studies,  ordering and review of radiographic studies, ordering and performing treatments and interventions, pulse oximetry, re-evaluation of patient's condition and review of old charts   Medications Ordered in ED Medications  cefTRIAXone (ROCEPHIN) 1 g in sodium chloride 0.9 % 100 mL IVPB (has no administration in time range)  octreotide (SANDOSTATIN) 2 mcg/mL load via infusion 50 mcg (has no administration in time range)    And  octreotide (SANDOSTATIN) 500 mcg in sodium chloride 0.9 % 250 mL (2 mcg/mL) infusion (has no administration in time range)  pantoprazole (PROTONIX) injection 80 mg (80 mg Intravenous Given 04/06/21 1633)    ED Course  I have reviewed the triage vital signs and the nursing notes.  Pertinent labs & imaging results that were available during my care of the patient were reviewed by me and considered in my medical decision making (see chart for details).    MDM Rules/Calculators/A&P                         DDx includes: Esophagitis Mallory Weiss tear [place]  Variceal bleeding PUD/Gastritis/ulcers Diverticular bleed Colon cancer Rectal bleed Internal hemorrhoids External hemorrhoids  65 year old male comes in with chief complaint of GI bleed.  He has history of gastritis leading to upper GI bleed requiring multiple rounds of blood transfusion in the past.  Reports noticing cranberry colored stool starting yesterday.  Bright red blood per rectum on exam.  Abdomen is soft, nondistended.  Questionable liver cirrhosis, as patient states that he only knows about nonalcoholic fatty liver disease history.  Patient is on apixaban for his history of A. fib, last dose was this morning.  His hemoglobin has dropped from 11.7-9.7 today. We will have to admit him for GI bleed work-up.  I have reviewed patient's upper endoscopy from 2 years ago.  It does not seem like there was a colonoscopy completed at that time.  That scope revealed gastric ulcer and gastritis.  No esophageal  varices but there was some mild portal gastropathy.  No CT scan of the abdomen or ultrasound in our chart, unsure if he actually has liver cirrhosis or this is a presumed diagnosis that was added to his chart  Patient received IV Protonix 80 mg here.  Discussed case with Dr. Rush Landmark, GI.  He recommends that we do give patient octreotide and also get an ultrasound abdomen complete.  Patient is stable for admission to medicine.  5:03 PM Nursing staff informed that patient went into tachycardia dysrhythmia for few seconds.  Rhythm strip appears to be consistent with A. fib with RVR.  He then reverted back to what seems like normal sinus rhythm. Formal EKG ordered.   Final Clinical Impression(s) / ED Diagnoses Final diagnoses:  Hematochezia    Rx / DC Orders ED Discharge Orders     None        Varney Biles, MD 04/06/21 [place], Alyjah Lovingood, MD 04/06/21 1704

## 2021-04-06 NOTE — ED Notes (Signed)
Taken to xray upon arrival to room

## 2021-04-06 NOTE — ED Notes (Signed)
US at bedside

## 2021-04-06 NOTE — ED Notes (Signed)
Messaged nurse on floor to let me know when she was ready for patient SBAR sent

## 2021-04-07 DIAGNOSIS — K766 Portal hypertension: Secondary | ICD-10-CM | POA: Diagnosis not present

## 2021-04-07 DIAGNOSIS — K922 Gastrointestinal hemorrhage, unspecified: Secondary | ICD-10-CM

## 2021-04-07 DIAGNOSIS — D62 Acute posthemorrhagic anemia: Secondary | ICD-10-CM | POA: Diagnosis not present

## 2021-04-07 DIAGNOSIS — K921 Melena: Secondary | ICD-10-CM

## 2021-04-07 DIAGNOSIS — K7460 Unspecified cirrhosis of liver: Secondary | ICD-10-CM

## 2021-04-07 DIAGNOSIS — K3189 Other diseases of stomach and duodenum: Secondary | ICD-10-CM

## 2021-04-07 DIAGNOSIS — R161 Splenomegaly, not elsewhere classified: Secondary | ICD-10-CM

## 2021-04-07 LAB — CBC
HCT: 31.3 % — ABNORMAL LOW (ref 39.0–52.0)
HCT: 32.5 % — ABNORMAL LOW (ref 39.0–52.0)
Hemoglobin: 9.3 g/dL — ABNORMAL LOW (ref 13.0–17.0)
Hemoglobin: 9.4 g/dL — ABNORMAL LOW (ref 13.0–17.0)
MCH: 22.6 pg — ABNORMAL LOW (ref 26.0–34.0)
MCH: 22.3 pg — ABNORMAL LOW (ref 26.0–34.0)
MCHC: 29.7 g/dL — ABNORMAL LOW (ref 30.0–36.0)
MCHC: 28.9 g/dL — ABNORMAL LOW (ref 30.0–36.0)
MCV: 76.2 fL — ABNORMAL LOW (ref 80.0–100.0)
MCV: 77.2 fL — ABNORMAL LOW (ref 80.0–100.0)
Platelets: 152 10*3/uL (ref 150–400)
Platelets: 155 10*3/uL (ref 150–400)
RBC: 4.11 MIL/uL — ABNORMAL LOW (ref 4.22–5.81)
RBC: 4.21 MIL/uL — ABNORMAL LOW (ref 4.22–5.81)
RDW: 16.7 % — ABNORMAL HIGH (ref 11.5–15.5)
RDW: 16.7 % — ABNORMAL HIGH (ref 11.5–15.5)
WBC: 6.3 10*3/uL (ref 4.0–10.5)
WBC: 6.7 10*3/uL (ref 4.0–10.5)
nRBC: 0 % (ref 0.0–0.2)
nRBC: 0 % (ref 0.0–0.2)

## 2021-04-07 LAB — COMPREHENSIVE METABOLIC PANEL
ALT: 24 U/L (ref 0–44)
AST: 59 U/L — ABNORMAL HIGH (ref 15–41)
Albumin: 3 g/dL — ABNORMAL LOW (ref 3.5–5.0)
Alkaline Phosphatase: 78 U/L (ref 38–126)
Anion gap: 12 (ref 5–15)
BUN: 20 mg/dL (ref 8–23)
CO2: 31 mmol/L (ref 22–32)
Calcium: 8.7 mg/dL — ABNORMAL LOW (ref 8.9–10.3)
Chloride: 95 mmol/L — ABNORMAL LOW (ref 98–111)
Creatinine, Ser: 1.2 mg/dL (ref 0.61–1.24)
GFR, Estimated: >60 mL/min (ref >60)
Glucose, Bld: 143 mg/dL — ABNORMAL HIGH (ref 70–99)
Potassium: 3.3 mmol/L — ABNORMAL LOW (ref 3.5–5.1)
Sodium: 138 mmol/L (ref 135–145)
Total Bilirubin: 1.1 mg/dL (ref 0.3–1.2)
Total Protein: 7.1 g/dL (ref 6.5–8.1)

## 2021-04-07 LAB — HEPATITIS A ANTIBODY, TOTAL: hep A Total Ab: NONREACTIVE

## 2021-04-07 LAB — HEPATITIS B SURFACE ANTIGEN: Hepatitis B Surface Ag: NONREACTIVE

## 2021-04-07 LAB — HEPATITIS B CORE ANTIBODY, TOTAL: Hep B Core Total Ab: NONREACTIVE

## 2021-04-07 LAB — MAGNESIUM: Magnesium: 2.2 mg/dL (ref 1.7–2.4)

## 2021-04-07 MED ORDER — POTASSIUM CHLORIDE CRYS ER 20 MEQ PO TBCR
60.0000 meq | EXTENDED_RELEASE_TABLET | Freq: Once | ORAL | Status: AC
  Administered 2021-04-07: 12:00:00 60 meq via ORAL
  Filled 2021-04-07: qty 3

## 2021-04-07 MED ORDER — SODIUM CHLORIDE 0.9 % IV SOLN: INTRAVENOUS | Status: DC

## 2021-04-07 NOTE — H&P (View-Only) (Signed)
Gastroenterology Inpatient Consultation   Attending Requesting Consult Angelica Pou, [facility] [facility] Day: 2  Reason for Consult Anemia, history of ulcer disease, chronic liver disease    History of Present Illness  Bodi Palmeri is a 65 y.o. male with a pmh significant for CHF, A. fib, hypertension, hyperlipidemia, OSA, fatty liver disease (based on imaging concern for splenomegaly and cirrhotic and previous EGD showing portal gastropathy appearing liver thus likely chronic liver disease).  The GI service is consulted for evaluation and management of anemia, dark stools, chronic liver disease.  The patient began to notice dark bowel movements on 12/24.  This continued into yesterday and he came in for further evaluation.  He has been having some abdominal discomfort in the periumbilical region for a few weeks now.  The discomfort comes and goes and is not related to food intake or fasting.  He has had a significant increase in his weight over the course of the last few months he has gained approximately 24 pounds however in the last week.  He denies any significant alcohol or tobacco use.  He has had a prior endoscopy performed for issues of anemia and dark stools and was found to have portal gastropathy as well as an ulcer though it was felt that this could be NG tube related.  He had not followed up with any gastroenterologist or liver doctor but he continues to remain sure that he only has fatty liver and not cirrhosis.  He has had an colonoscopy years ago in [place] as well and does not recall having any polyps.  He does not know if he has been more than 5 years however.  The patient denies any hematemesis or coffee-ground emesis.  He has had coffee grounds before but this was years ago.  The patient does not take significant nonsteroidals or BC/Goody powders but he is on Eliquis.  GI Review of Systems Positive as above Negative for dysphagia, odynophagia, nausea,  vomiting, hematochezia   Review of Systems  General: Denies fevers/chills HEENT: Denies oral lesions Cardiovascular: Denies chest pain/palpitations Pulmonary: Denies progressive shortness of breath/cough Gastroenterological: See HPI Genitourinary: Denies darkened urine or hematuria Hematological: Positive for history of easy bruising/bleeding Dermatological: Denies jaundice Psychological: Mood is stable   Histories  Past Medical History Past Medical History:  Diagnosis Date   Acute on chronic congestive heart failure (HCC)    Acute on chronic diastolic CHF (congestive heart failure) ([place]) 08/23/2019   Acute on chronic right-sided heart failure ([place]) 08/06/2019   Acute respiratory failure with hypoxia (HCC)    Anemia of chronic disease    Atrial fibrillation ([place]) 12/23/2019   Atrial tachycardia ([place]) 08/12/2019   Benign essential HTN    Chest pain 08/23/2019   Chronic anticoagulation 12/26/2019   Chronic heart failure with preserved EF 05/30/2019   Echo 07/2019: EF 55-60, GR 1 DD   Chronic pain syndrome    Cirrhosis of liver without ascites ([place])    Class 2 severe obesity due to excess calories with serious comorbidity and body mass index (BMI) of 39.0 to 39.9 in adult ([place]) 05/30/2019   Debility 03/13/2019   Drug induced constipation    Dyspnea    Fatty liver disease, nonalcoholic    GIB (gastrointestinal bleeding) 03/04/2019   Heart failure, systolic, acute ([place]) 0/37/0488   History of pulmonary embolism 08/12/2019   Hyperlipidemia 01/24/2019   Hypertension 01/24/2019   Hypoalbuminemia due to protein-calorie malnutrition (HCC)    Hypokalemia  Hyponatremia    Hypotension due to drugs    Insomnia 01/24/2019   Lactic acidosis 08/23/2019   Lumbar disc herniation 01/24/2019   Mixed hyperlipidemia 01/24/2019   Morbid obesity with BMI of 40.0-44.9, adult ([place]) 05/30/2019   Normocytic anemia 06/08/2019   Occasional tremors 08/12/2019   OSA (obstructive sleep apnea) 08/12/2019    Palpitations 12/26/2019   Peripheral edema    Personal history of DVT (deep vein thrombosis) 12/26/2019   Portal hypertensive gastropathy ([place]) 03/06/2019   Seen on EGD 03/06/2019   Pulmonary emboli ([place]) 06/08/2019   Pulmonary embolism ([place]) 06/08/2019   Redness and swelling of lower leg 08/23/2019   Shock ([place])    Thrombocytopenia ([place])    Past Surgical History:  Procedure Laterality Date   ESOPHAGOGASTRODUODENOSCOPY (EGD) WITH PROPOFOL N/A 03/05/2019   Procedure: ESOPHAGOGASTRODUODENOSCOPY (EGD) WITH PROPOFOL;  Surgeon: Doran Stabler, MD;  Location: [facility];  Service: Gastroenterology;  Laterality: N/A;  Large Blood Clot Removed   ESOPHAGOGASTRODUODENOSCOPY (EGD) WITH PROPOFOL N/A 03/06/2019   Procedure: ESOPHAGOGASTRODUODENOSCOPY (EGD) WITH PROPOFOL;  Surgeon: Thornton Park, MD;  Location: [facility];  Service: Gastroenterology;  Laterality: N/A;   HEMORROIDECTOMY     PRESSURE SENSOR/CARDIOMEMS N/A 11/14/2020   Procedure: PRESSURE SENSOR/CARDIOMEMS;  Surgeon: Larey Dresser, MD;  Location: [facility];  Service: Cardiovascular;  Laterality: N/A;   RIGHT HEART CATH N/A 08/09/2019   Procedure: RIGHT HEART CATH;  Surgeon: Larey Dresser, MD;  Location: [facility];  Service: Cardiovascular;  Laterality: N/A;   RIGHT HEART CATH N/A 11/01/2020   Procedure: RIGHT HEART CATH;  Surgeon: Larey Dresser, MD;  Location: [facility];  Service: Cardiovascular;  Laterality: N/A;   RIGHT HEART CATH N/A 11/14/2020   Procedure: RIGHT HEART CATH;  Surgeon: Larey Dresser, MD;  Location: [facility];  Service: Cardiovascular;  Laterality: N/A;   RIGHT/LEFT HEART CATH AND CORONARY ANGIOGRAPHY N/A 05/10/2020   Procedure: RIGHT/LEFT HEART CATH AND CORONARY ANGIOGRAPHY;  Surgeon: Larey Dresser, MD;  Location: [facility];  Service: Cardiovascular;  Laterality: N/A;    Allergies Allergies  Allergen Reactions   Penicillins Other (See Comments)    Caused  convulsions as a child  Did it involve swelling of the face/tongue/throat, SOB, or low BP? N/A Did it involve sudden or severe rash/hives, skin peeling, or any reaction on the inside of your mouth or nose? N/A Did you need to seek medical attention at a hospital or doctor's office?N/A When did it last happen? N/A If all above answers are "NO", may proceed with cephalosporin use.    Family History Family History  Problem Relation Age of Onset   Hyperlipidemia Mother    Hypertension Mother    Stroke Mother    CAD Maternal Grandmother    CAD Maternal Grandfather    The patient's FH is negative for IBD/IBS/Liver Disease/GI Malignancies.  Social History Social History   Socioeconomic History   Marital status: Single    Spouse name: Not on file   Number of children: Not on file   Years of education: Not on file   Highest education level: Not on file  Occupational History   Not on file  Tobacco Use   Smoking status: Never   Smokeless tobacco: Never  Vaping Use   Vaping Use: Never used  Substance and Sexual Activity   Alcohol use: Not Currently   Drug use: Not Currently    Types: Marijuana  Comment: Smoked for 30 years   Sexual activity: Not on file  Other Topics Concern   Not on file  Social History Narrative   Not on file   Social Determinants of Health   Financial Resource Strain: Medium Risk   Difficulty of Paying Living Expenses: Somewhat hard  Food Insecurity: No Food Insecurity   Worried About Running Out of Food in the Last Year: Never true   Ran Out of Food in the Last Year: Never true  Transportation Needs: Unmet Transportation Needs   Lack of Transportation (Medical): Yes   Lack of Transportation (Non-Medical): No  Physical Activity: Not on file  Stress: Not on file  Social Connections: Not on file  Intimate Partner Violence: Not on file    Medications  Home Medications No current facility-administered medications on file prior to encounter.    Current Outpatient Medications on File Prior to Encounter  Medication Sig Dispense Refill   acetaminophen (TYLENOL) 500 MG tablet Take 500-1,000 mg by mouth every 6 (six) hours as needed for mild pain, moderate pain or headache.     apixaban (ELIQUIS) 5 MG TABS tablet Take 1 tablet (5 mg total) by mouth 2 (two) times daily. 60 tablet 0   Buprenorphine HCl-Naloxone HCl 8-2 MG FILM Place 1 tablet under the tongue 2 (two) times daily.     eplerenone (INSPRA) 50 MG tablet TAKE 2 TABLETS BY MOUTH DAILY (Patient taking differently: Take 100 mg by mouth in the morning.) 60 tablet 3   gabapentin (NEURONTIN) 800 MG tablet Take 1 tablet (800 mg total) by mouth 3 (three) times daily. 90 tablet 1   lactulose (CHRONULAC) 10 GM/15ML solution Take 15 mLs (10 g total) by mouth 3 (three) times daily. 946 mL 1   metolazone (ZAROXOLYN) 2.5 MG tablet Take 1 tablet (2.5 mg total) by mouth 3 (three) times a week. Monday, Wednesday and Fridays (Patient taking differently: Take 2.5 mg by mouth every Monday, Wednesday, and Friday.) 14 tablet 0   metoprolol succinate (TOPROL XL) 25 MG 24 hr tablet Take 1 tablet (25 mg total) by mouth 2 (two) times daily. 60 tablet 2   ondansetron (ZOFRAN-ODT) 4 MG disintegrating tablet TAKE 1 TABLET BY MOUTH EVERY 8 HOURS AS NEEDED FOR NAUSEA OR VOMITING (Patient taking differently: 4 mg every 8 (eight) hours as needed for nausea or vomiting (disolve orally).) 20 tablet 2   pantoprazole (PROTONIX) 40 MG tablet Take 40 mg by mouth 2 (two) times daily before a meal.     polyethylene glycol (MIRALAX / GLYCOLAX) 17 g packet Take 17 g by mouth as needed for mild constipation (mix and drink as directed).     potassium chloride SA (KLOR-CON M) 20 MEQ tablet TAKE 5 TABLETS BY MOUTH TWICE DAILY. EXTRA 40 MEQ WITH METOLAZONE DAYS. (Patient taking differently: 40-100 mEq See admin instructions. Take 100 mEq by mouth in the morning and evening & an additional 40 mEq on Mon/Wed/Fri) 260 tablet 0    pravastatin (PRAVACHOL) 20 MG tablet Take 1 tablet (20 mg total) by mouth daily. (Patient taking differently: Take 20 mg by mouth at bedtime.) 30 tablet 1   sertraline (ZOLOFT) 50 MG tablet Take 1 tablet (50 mg total) by mouth daily. 30 tablet 3   sodium chloride (OCEAN) 0.65 % SOLN nasal spray Place 1 spray into both nostrils as needed for congestion.     tamsulosin (FLOMAX) 0.4 MG CAPS capsule Take 1 capsule (0.4 mg total) by mouth daily. 30 capsule 0  torsemide (DEMADEX) 20 MG tablet Take 100 mg by mouth See admin instructions. Take 100 mg by mouth in the morning and evening     triamcinolone (KENALOG) 0.025 % ointment Apply 1 application topically 2 (two) times daily. (Patient taking differently: Apply 1 application topically 2 (two) times daily as needed (as directed- to affected sites).) 30 g 3   VENTOLIN HFA 108 (90 Base) MCG/ACT inhaler Inhale 1 puff into the lungs every 4 (four) hours as needed for shortness of breath. (Patient taking differently: Inhale 2 puffs into the lungs every 4 (four) hours as needed for shortness of breath.) 6.7 g 1   zolpidem (AMBIEN) 10 MG tablet Take 1 tablet (10 mg total) by mouth at bedtime as needed for sleep. (Patient taking differently: Take 10 mg by mouth at bedtime.) 30 tablet 0   torsemide (DEMADEX) 100 MG tablet Take 1 tablet (100 mg total) by mouth 2 (two) times daily. (Patient not taking: Reported on 04/06/2021) 60 tablet 3   [DISCONTINUED] bumetanide (BUMEX) 1 MG tablet Take 4 tablets (4 mg total) by mouth daily with breakfast AND 3 tablets (3 mg total) every evening. 180 tablet 3   [DISCONTINUED] metoprolol tartrate (LOPRESSOR) 50 MG tablet Take 1 tablet (50 mg total) by mouth 2 (two) times daily. 180 tablet 1   Scheduled Inpatient Medications  buprenorphine-naloxone  1 tablet Sublingual BID   gabapentin  800 mg Oral TID   lactulose  10 g Oral TID   metolazone  2.5 mg Oral Once per day on Mon Wed Fri   metoprolol tartrate  12.5 mg Oral BID    pantoprazole (PROTONIX) IV  40 mg Intravenous Q12H   pravastatin  20 mg Oral Daily   sertraline  50 mg Oral Daily   spironolactone  100 mg Oral Daily   tamsulosin  0.4 mg Oral Daily   torsemide  100 mg Oral BID   Continuous Inpatient Infusions  octreotide  (SANDOSTATIN)    IV infusion 50 mcg/hr (04/07/21 0315)   PRN Inpatient Medications albuterol, zolpidem   Physical Examination  BP 103/60 (BP Location: Left Arm)    Pulse 68    Temp 99.2 F (37.3 C) (Oral)    Resp 15    Ht 6' 3"  (1.905 m)    Wt (!) 176 kg    SpO2 96%    BMI 48.50 kg/m  GEN: Chronically ill-appearing but nontoxic, no acute distress PSYCH: Cooperative, without pressured speech EYE: Conjunctivae pink, sclerae anicteric ENT: MMM CV: Nontachycardic RESP: Decreased breath sounds at the bases bilaterally GI: NABS, protuberant abdomen, rounded, unable to appreciate hepatosplenomegaly due to significant body habitus  GU: DRE deferred by patient MSK/EXT: Lower extremity edema bilaterally SKIN: Spider angiomata noted on upper thorax, no jaundice NEURO:  Alert & Oriented x 3, no focal deficits, no evidence of asterixis   Review of Data  I reviewed the following data at the time of this encounter:  Laboratory Studies   Recent Labs  Lab 04/07/21 0740  NA 138  K 3.3*  CL 95*  CO2 31  BUN 20  CREATININE 1.20  GLUCOSE 143*  CALCIUM 8.7*   Recent Labs  Lab 04/07/21 0740  AST 59*  ALT 24  ALKPHOS 78    Recent Labs  Lab 04/06/21 1454 04/07/21 0740  WBC 7.5 6.3  HGB 9.7* 9.3*  HCT 32.6* 31.3*  PLT 150 152   Recent Labs  Lab 04/06/21 1652  INR 1.3*   MELD-Na score: 11 at 04/07/2021  7:40 AM MELD score: 11 at 04/07/2021  7:40 AM Calculated from: Serum Creatinine: 1.20 mg/dL at 04/07/2021  7:40 AM Serum Sodium: 138 mmol/L (Using max of 137 mmol/L) at 04/07/2021  7:40 AM Total Bilirubin: 1.1 mg/dL at 04/07/2021  7:40 AM INR(ratio): 1.3 at 04/06/2021  4:52 PM Age: 52 years  Imaging Studies   Abdominal ultrasound done yesterday IMPRESSION: Cirrhotic liver with splenomegaly.   Inadequate visualization of pancreas and abdominal aorta.  GI Procedures and Studies  2020 EGD - Very mild portal hypertensive gastropathy. - No esophageal or gastric varices. - Small, non-bleeding gastric ulcer with no stigmata of bleeding in an area of localized erythema. Location is suspicious for NG tube trauma. Did he have an NG tube at the outside hospital? - Normal examined duodenum. - No specimens collected. - No blood or heme seen.   Assessment  Mr. Blank is a 65 y.o. male with a pmh significant for CHF, A. fib, hypertension, hyperlipidemia, OSA, fatty liver disease (based on imaging concern for splenomegaly and cirrhotic and previous EGD showing portal gastropathy appearing liver thus likely chronic liver disease).  The GI service is consulted for evaluation and management of anemia, dark stools, chronic liver disease.  The patient is hemodynamically stable today.  Hemoglobin has down trended but he has not had overt CGE or hematemesis.  Last bowel movement was yesterday.  He has most definitely cirrhosis and it may well be contribution from metabolic associated fatty liver as well as cardiogenic.  In either case, his imaging is suggestive of this.  An upper endoscopy to ensure that he has no evidence of varices as a source for his blood loss anemia is reasonable next step and we will plan for that tomorrow as long as he is hemodynamically stable.  He will remain on octreotide and ceftriaxone and PPI until then.  If this turns out to just be PHG related then that would be the best of situations.  If he has no etiology for his symptoms/dark stools/melena/anemia then he may need a colonoscopy.  The risks and benefits of endoscopic evaluation were discussed with the patient; these include but are not limited to the risk of perforation, infection, bleeding, missed lesions, lack of diagnosis, severe  illness requiring hospitalization, as well as anesthesia and sedation related illnesses.  The patient and/or family is agreeable to proceed.  All patient questions were answered to the best of my ability, and the patient agrees to the aforementioned plan of action with follow-up as indicated.   Plan/Recommendations  #Concern for Variceal UGIB -Continue IV Protonix Drip @8  mcg/hr -Continue IV Octreotide Drip @50  mcg/hr -Continue IV Ceftriaxone 1000-2000 mg daily (if varices noted then will continue treatment for at least 5 days as has been shown to decrease SBP and other infectious complications in Cirrhotics) -Maintain >= 2 large bore IVs (20 gauge) -Will plan for tentative EGD on 12/27 -CLD today - Trend Hgb/Hct -Hepatitis serologies have been ordered -If significant bleeding and hypotension or hemodynamic compromise occurs, please alert the GI Team to see if a more emergent scope has to be completed    Thank you for this consult.  We will continue to follow.  Please page/call with questions or concerns.   Justice Britain, MD [facility] Office # [phone]

## 2021-04-07 NOTE — Anesthesia Preprocedure Evaluation (Addendum)
Anesthesia Evaluation  Patient identified by MRN, date of birth, ID band Patient awake    Reviewed: Allergy & Precautions, NPO status , Patient's Chart, lab work & pertinent test results  History of Anesthesia Complications Negative for: history of anesthetic complications  Airway Mallampati: I  TM Distance: >3 FB Neck ROM: Full    Dental  (+) Poor Dentition, Edentulous Upper, Edentulous Lower, Dental Advisory Given   Pulmonary sleep apnea , COPD, PE   Pulmonary exam normal        Cardiovascular hypertension, Pt. on medications Normal cardiovascular exam+ dysrhythmias Atrial Fibrillation   IMPRESSIONS    1. Left ventricular ejection fraction, by estimation, is 60 to 65%. The  left ventricle has normal function. The left ventricle has no regional  wall motion abnormalities. There is mild concentric left ventricular  hypertrophy. Left ventricular diastolic  parameters were normal.  2. Right ventricular systolic function is normal. The right ventricular  size is normal. Tricuspid regurgitation signal is inadequate for assessing  PA pressure.  3. Left atrial size was mildly dilated.  4. The mitral valve is normal in structure. No evidence of mitral valve  regurgitation. No evidence of mitral stenosis.  5. The aortic valve is tricuspid. Aortic valve regurgitation is not  visualized. No aortic stenosis is present.  6. There is borderline dilatation of the aortic root, measuring    Neuro/Psych negative neurological ROS     GI/Hepatic Neg liver ROS, GERD  ,  Endo/Other  Morbid obesity  Renal/GU negative Renal ROS     Musculoskeletal negative musculoskeletal ROS (+)   Abdominal   Peds  Hematology negative hematology ROS (+) anemia ,   Anesthesia Other Findings   Reproductive/Obstetrics                           Anesthesia Physical Anesthesia Plan  ASA: 3  Anesthesia Plan: MAC    Post-op Pain Management:    Induction:   PONV Risk Score and Plan: 1 and Ondansetron and Propofol infusion  Airway Management Planned: Natural Airway  Additional Equipment:   Intra-op Plan:   Post-operative Plan:   Informed Consent: I have reviewed the patients History and Physical, chart, labs and discussed the procedure including the risks, benefits and alternatives for the proposed anesthesia with the patient or authorized representative who has indicated his/her understanding and acceptance.     Dental advisory given  Plan Discussed with: Anesthesiologist and CRNA  Anesthesia Plan Comments:        Anesthesia Quick Evaluation

## 2021-04-07 NOTE — Plan of Care (Signed)

## 2021-04-07 NOTE — Progress Notes (Signed)
PT refusing bed alarm. Educated on need for safety. Pt continues to refuse.

## 2021-04-07 NOTE — Progress Notes (Signed)
HD#1 SUBJECTIVE:  Patient Summary: Charles Gray is a 65 y.o. with a pertinent PMH of chronic CHF, Afib on Eliquis, s/p cardiomems device, HTN< HLD, liver cirrhosis, previous GI bleed, and OSA who presented with bloody bowel movements and admitted for GI bleed and probable acute on chronic HF.   Overnight Events: No acute events overnight.  Interim History: This is hospital day 1 for this patient who was seen and evaluated at the bedside. He states that he feels better this morning. He has not had a bowel movement since he has been in the hospital. Denies any feelings of lightheadedness, dizziness, or presyncope.   OBJECTIVE:  Vital Signs: Vitals:   04/06/21 1909 04/06/21 2009 04/06/21 2351 04/07/21 0411  BP: 119/76 (!) 100/51 118/68 116/63  Pulse: 82 63 61 62  Resp: 17 20 20 20   Temp: 98.1 F (36.7 C) 98.3 F (36.8 C) 97.9 F (36.6 C) 98.7 F (37.1 C)  TempSrc: Oral Oral  Oral  SpO2: 98% 96% 98% 100%  Weight:    (!) 176 kg  Height:       Supplemental O2: Room Air SpO2: 100 %  Filed Weights   04/06/21 1423 04/07/21 0411  Weight: (!) 170.1 kg (!) 176 kg     Intake/Output Summary (Last 24 hours) at 04/07/2021 0604 Last data filed at 04/07/2021 0315 Gross per 24 hour  Intake 423.93 ml  Output 1200 ml  Net -776.07 ml   Net IO Since Admission: -776.07 mL [04/07/21 0604]  Physical Exam: General: Pleasant, obese male laying in bed. No acute distress. CV: RRR. No murmurs, rubs, or gallops. 2-3+ bilateral LE edema extending to thighs Pulmonary: Lungs CTAB. Normal effort and good air movement.  Abdominal: Soft, tenderness to palpation RUQ, distended. Normal bowel sounds. Extremities: Unable to palpate DP pulses 2/2 edema (pulses confirmed with doppler, although faint). Good ROM.  Skin: Warm and dry. Extensive venous stasis dermatitis bilateral lower extremities extending right below knees with skin stretching noted and previous ulceration that is scabbed over on left leg.   Neuro: A&Ox3. Moves all extremities.No focal deficit. Psych: Normal mood and affect    ASSESSMENT/PLAN:  Assessment: Principal Problem:   GI bleed   Plan: #Upper GI bleed Patient presented with multiple episodes of dark bowel movements and abdominal pain. He has a hx of GI bleed that required multiple blood transfusions and gastritis/gastric ulcer as noted on EGD in 2020 and has been taking protonix bid. He had no evidence of varices seen on last EGD. Hb on admission 9.7, which is a drop from 11.7. Most recent Hb 9.3.  - GI consulted, appreciate recs - Continue IV octreotide - Continue IV protonix 40 mg bid  - Clear liquid diet, NPO at midnight for procedure with GI - Daily CMP, CBC bid  - Transfuse if Hb <7   #Paroxysmal Afib on Eliquis Patient had a brief episode where he went into Afib with RVR in the ED, however, he reverted back to normal sinus rhythm and HR was in the 60s upon evaluation. Will hold his Eliquis in the setting of GI bleed. K level 3.0 today likely secondary to diuretics. - Cardiac monitoring - Hold eliquis in setting of bleed - Reduce home metoprolol to 12.5 mg bid, consider stopping  - Replete K today - Maintain K>4, Mg >2   #Chronic HFpEF Last echo in July with EF of 60-65%. Last RHC in August with normal filling pressures and he had a Cardiomems pressure sensor placed at  that time. Has had multiple hospitalizations for acute on chronic HF in the past. Follows with Dr. [name] regularly. On Metoprolol succinate 25 mg bid, torsemide 100 mg bid, eplerenone 100 mg, and metolazone 2.5 mg MWF at home.  - Heart failure team consulted, appreciate recs - Strict I&Os and daily weights - Restart metoprolol at 12.5 mg bid today, consider holding - Resume eplerenone, torsemide, and metolazone    #Cirrhosis  AST/ALT within normal limits, although patient has significant RUQ tenderness to palpation. On lactulose tid at home, although patient reports he has only been  having 1 bowel movement per day. MELD score 14, low risk for 90d mortality. Ultrasound this admission showed cirrhotic liver with splenomegaly. - Continue lactulose 10 g tid   #Chronic venous stasis dermatitis bilateral lower extremities #Tinea pedis Likely secondary to venous insuffiencey. Does not appear to be actively infected, patient is afebrile, and it would be unusual to have bilateral cellulitis. Unable to palpate lower extremity pulses secondary to his swelling- dopplers showed faint pulses bilaterally. - Consider wound care consult - Place unna boots - Consider podiatry consult as an outpatient   #Hx of opioid use disorder Secondary to prescription opioids.  - Continue suboxone   #Hx PE/DVT On eliquis as noted above.   #OSA not on CPAP Reportedly cannot tolerate CPAP.  Best Practice: Diet: Clear liquid diet, NPO at midnight IVF: Fluids: none VTE: SCDs Start: 04/06/21 1746 Code: Full AB: None DISPO: Anticipated discharge to Home pending Medical stability.  Signature: Buddy Duty, D.O.  Internal Medicine Resident, PGY-1 [facility]  Pager: [phone] 6:04 AM, 04/07/2021   Please contact the on call pager after 5 pm and on weekends at [phone].

## 2021-04-07 NOTE — Consult Note (Signed)
Gastroenterology Inpatient Consultation   Attending Requesting Consult Angelica Pou, [place] Hospital Day: 2  Reason for Consult Anemia, history of ulcer disease, chronic liver disease    History of Present Illness  Charles Gray is a 65 y.o. male with a pmh significant for CHF, A. fib, hypertension, hyperlipidemia, OSA, fatty liver disease (based on imaging concern for splenomegaly and cirrhotic and previous EGD showing portal gastropathy appearing liver thus likely chronic liver disease).  The GI service is consulted for evaluation and management of anemia, dark stools, chronic liver disease.  The patient began to notice dark bowel movements on 12/24.  This continued into yesterday and he came in for further evaluation.  He has been having some abdominal discomfort in the periumbilical region for a few weeks now.  The discomfort comes and goes and is not related to food intake or fasting.  He has had a significant increase in his weight over the course of the last few months he has gained approximately 24 pounds however in the last week.  He denies any significant alcohol or tobacco use.  He has had a prior endoscopy performed for issues of anemia and dark stools and was found to have portal gastropathy as well as an ulcer though it was felt that this could be NG tube related.  He had not followed up with any gastroenterologist or liver doctor but he continues to remain sure that he only has fatty liver and not cirrhosis.  He has had an colonoscopy years ago in [place] as well and does not recall having any polyps.  He does not know if he has been more than 5 years however.  The patient denies any hematemesis or coffee-ground emesis.  He has had coffee grounds before but this was years ago.  The patient does not take significant nonsteroidals or BC/Goody powders but he is on Eliquis.  GI Review of Systems Positive as above Negative for dysphagia, odynophagia, nausea,  vomiting, hematochezia   Review of Systems  General: Denies fevers/chills HEENT: Denies oral lesions Cardiovascular: Denies chest pain/palpitations Pulmonary: Denies progressive shortness of breath/cough Gastroenterological: See HPI Genitourinary: Denies darkened urine or hematuria Hematological: Positive for history of easy bruising/bleeding Dermatological: Denies jaundice Psychological: Mood is stable   Histories  Past Medical History Past Medical History:  Diagnosis Date   Acute on chronic congestive heart failure (HCC)    Acute on chronic diastolic CHF (congestive heart failure) ([place]) 08/23/2019   Acute on chronic right-sided heart failure ([place]) 08/06/2019   Acute respiratory failure with hypoxia (HCC)    Anemia of chronic disease    Atrial fibrillation ([place]) 12/23/2019   Atrial tachycardia ([place]) 08/12/2019   Benign essential HTN    Chest pain 08/23/2019   Chronic anticoagulation 12/26/2019   Chronic heart failure with preserved EF 05/30/2019   Echo 07/2019: EF 55-60, GR 1 DD   Chronic pain syndrome    Cirrhosis of liver without ascites ([place])    Class 2 severe obesity due to excess calories with serious comorbidity and body mass index (BMI) of 39.0 to 39.9 in adult ([place]) 05/30/2019   Debility 03/13/2019   Drug induced constipation    Dyspnea    Fatty liver disease, nonalcoholic    GIB (gastrointestinal bleeding) 03/04/2019   Heart failure, systolic, acute ([place]) 10/22/1973   History of pulmonary embolism 08/12/2019   Hyperlipidemia 01/24/2019   Hypertension 01/24/2019   Hypoalbuminemia due to protein-calorie malnutrition (HCC)    Hypokalemia  Hyponatremia    Hypotension due to drugs    Insomnia 01/24/2019   Lactic acidosis 08/23/2019   Lumbar disc herniation 01/24/2019   Mixed hyperlipidemia 01/24/2019   Morbid obesity with BMI of 40.0-44.9, adult ([place]) 05/30/2019   Normocytic anemia 06/08/2019   Occasional tremors 08/12/2019   OSA (obstructive sleep apnea) 08/12/2019    Palpitations 12/26/2019   Peripheral edema    Personal history of DVT (deep vein thrombosis) 12/26/2019   Portal hypertensive gastropathy ([place]) 03/06/2019   Seen on EGD 03/06/2019   Pulmonary emboli ([place]) 06/08/2019   Pulmonary embolism ([place]) 06/08/2019   Redness and swelling of lower leg 08/23/2019   Shock ([place])    Thrombocytopenia ([place])    Past Surgical History:  Procedure Laterality Date   ESOPHAGOGASTRODUODENOSCOPY (EGD) WITH PROPOFOL N/A 03/05/2019   Procedure: ESOPHAGOGASTRODUODENOSCOPY (EGD) WITH PROPOFOL;  Surgeon: Doran Stabler, MD;  Location: [facility];  Service: Gastroenterology;  Laterality: N/A;  Large Blood Clot Removed   ESOPHAGOGASTRODUODENOSCOPY (EGD) WITH PROPOFOL N/A 03/06/2019   Procedure: ESOPHAGOGASTRODUODENOSCOPY (EGD) WITH PROPOFOL;  Surgeon: Thornton Park, MD;  Location: [facility];  Service: Gastroenterology;  Laterality: N/A;   HEMORROIDECTOMY     PRESSURE SENSOR/CARDIOMEMS N/A 11/14/2020   Procedure: PRESSURE SENSOR/CARDIOMEMS;  Surgeon: Larey Dresser, MD;  Location: [facility];  Service: Cardiovascular;  Laterality: N/A;   RIGHT HEART CATH N/A 08/09/2019   Procedure: RIGHT HEART CATH;  Surgeon: Larey Dresser, MD;  Location: [facility];  Service: Cardiovascular;  Laterality: N/A;   RIGHT HEART CATH N/A 11/01/2020   Procedure: RIGHT HEART CATH;  Surgeon: Larey Dresser, MD;  Location: [facility];  Service: Cardiovascular;  Laterality: N/A;   RIGHT HEART CATH N/A 11/14/2020   Procedure: RIGHT HEART CATH;  Surgeon: Larey Dresser, MD;  Location: [facility];  Service: Cardiovascular;  Laterality: N/A;   RIGHT/LEFT HEART CATH AND CORONARY ANGIOGRAPHY N/A 05/10/2020   Procedure: RIGHT/LEFT HEART CATH AND CORONARY ANGIOGRAPHY;  Surgeon: Larey Dresser, MD;  Location: [facility];  Service: Cardiovascular;  Laterality: N/A;    Allergies Allergies  Allergen Reactions   Penicillins Other (See Comments)    Caused  convulsions as a child  Did it involve swelling of the face/tongue/throat, SOB, or low BP? N/A Did it involve sudden or severe rash/hives, skin peeling, or any reaction on the inside of your mouth or nose? N/A Did you need to seek medical attention at a hospital or doctor's office?N/A When did it last happen? N/A If all above answers are "NO", may proceed with cephalosporin use.    Family History Family History  Problem Relation Age of Onset   Hyperlipidemia Mother    Hypertension Mother    Stroke Mother    CAD Maternal Grandmother    CAD Maternal Grandfather    The patient's FH is negative for IBD/IBS/Liver Disease/GI Malignancies.  Social History Social History   Socioeconomic History   Marital status: Single    Spouse name: Not on file   Number of children: Not on file   Years of education: Not on file   Highest education level: Not on file  Occupational History   Not on file  Tobacco Use   Smoking status: Never   Smokeless tobacco: Never  Vaping Use   Vaping Use: Never used  Substance and Sexual Activity   Alcohol use: Not Currently   Drug use: Not Currently    Types: Marijuana  Comment: Smoked for 30 years   Sexual activity: Not on file  Other Topics Concern   Not on file  Social History Narrative   Not on file   Social Determinants of Health   Financial Resource Strain: Medium Risk   Difficulty of Paying Living Expenses: Somewhat hard  Food Insecurity: No Food Insecurity   Worried About Running Out of Food in the Last Year: Never true   Ran Out of Food in the Last Year: Never true  Transportation Needs: Unmet Transportation Needs   Lack of Transportation (Medical): Yes   Lack of Transportation (Non-Medical): No  Physical Activity: Not on file  Stress: Not on file  Social Connections: Not on file  Intimate Partner Violence: Not on file    Medications  Home Medications No current facility-administered medications on file prior to encounter.    Current Outpatient Medications on File Prior to Encounter  Medication Sig Dispense Refill   acetaminophen (TYLENOL) 500 MG tablet Take 500-1,000 mg by mouth every 6 (six) hours as needed for mild pain, moderate pain or headache.     apixaban (ELIQUIS) 5 MG TABS tablet Take 1 tablet (5 mg total) by mouth 2 (two) times daily. 60 tablet 0   Buprenorphine HCl-Naloxone HCl 8-2 MG FILM Place 1 tablet under the tongue 2 (two) times daily.     eplerenone (INSPRA) 50 MG tablet TAKE 2 TABLETS BY MOUTH DAILY (Patient taking differently: Take 100 mg by mouth in the morning.) 60 tablet 3   gabapentin (NEURONTIN) 800 MG tablet Take 1 tablet (800 mg total) by mouth 3 (three) times daily. 90 tablet 1   lactulose (CHRONULAC) 10 GM/15ML solution Take 15 mLs (10 g total) by mouth 3 (three) times daily. 946 mL 1   metolazone (ZAROXOLYN) 2.5 MG tablet Take 1 tablet (2.5 mg total) by mouth 3 (three) times a week. Monday, Wednesday and Fridays (Patient taking differently: Take 2.5 mg by mouth every Monday, Wednesday, and Friday.) 14 tablet 0   metoprolol succinate (TOPROL XL) 25 MG 24 hr tablet Take 1 tablet (25 mg total) by mouth 2 (two) times daily. 60 tablet 2   ondansetron (ZOFRAN-ODT) 4 MG disintegrating tablet TAKE 1 TABLET BY MOUTH EVERY 8 HOURS AS NEEDED FOR NAUSEA OR VOMITING (Patient taking differently: 4 mg every 8 (eight) hours as needed for nausea or vomiting (disolve orally).) 20 tablet 2   pantoprazole (PROTONIX) 40 MG tablet Take 40 mg by mouth 2 (two) times daily before a meal.     polyethylene glycol (MIRALAX / GLYCOLAX) 17 g packet Take 17 g by mouth as needed for mild constipation (mix and drink as directed).     potassium chloride SA (KLOR-CON M) 20 MEQ tablet TAKE 5 TABLETS BY MOUTH TWICE DAILY. EXTRA 40 MEQ WITH METOLAZONE DAYS. (Patient taking differently: 40-100 mEq See admin instructions. Take 100 mEq by mouth in the morning and evening & an additional 40 mEq on Mon/Wed/Fri) 260 tablet 0    pravastatin (PRAVACHOL) 20 MG tablet Take 1 tablet (20 mg total) by mouth daily. (Patient taking differently: Take 20 mg by mouth at bedtime.) 30 tablet 1   sertraline (ZOLOFT) 50 MG tablet Take 1 tablet (50 mg total) by mouth daily. 30 tablet 3   sodium chloride (OCEAN) 0.65 % SOLN nasal spray Place 1 spray into both nostrils as needed for congestion.     tamsulosin (FLOMAX) 0.4 MG CAPS capsule Take 1 capsule (0.4 mg total) by mouth daily. 30 capsule 0  torsemide (DEMADEX) 20 MG tablet Take 100 mg by mouth See admin instructions. Take 100 mg by mouth in the morning and evening     triamcinolone (KENALOG) 0.025 % ointment Apply 1 application topically 2 (two) times daily. (Patient taking differently: Apply 1 application topically 2 (two) times daily as needed (as directed- to affected sites).) 30 g 3   VENTOLIN HFA 108 (90 Base) MCG/ACT inhaler Inhale 1 puff into the lungs every 4 (four) hours as needed for shortness of breath. (Patient taking differently: Inhale 2 puffs into the lungs every 4 (four) hours as needed for shortness of breath.) 6.7 g 1   zolpidem (AMBIEN) 10 MG tablet Take 1 tablet (10 mg total) by mouth at bedtime as needed for sleep. (Patient taking differently: Take 10 mg by mouth at bedtime.) 30 tablet 0   torsemide (DEMADEX) 100 MG tablet Take 1 tablet (100 mg total) by mouth 2 (two) times daily. (Patient not taking: Reported on 04/06/2021) 60 tablet 3   [DISCONTINUED] bumetanide (BUMEX) 1 MG tablet Take 4 tablets (4 mg total) by mouth daily with breakfast AND 3 tablets (3 mg total) every evening. 180 tablet 3   [DISCONTINUED] metoprolol tartrate (LOPRESSOR) 50 MG tablet Take 1 tablet (50 mg total) by mouth 2 (two) times daily. 180 tablet 1   Scheduled Inpatient Medications  buprenorphine-naloxone  1 tablet Sublingual BID   gabapentin  800 mg Oral TID   lactulose  10 g Oral TID   metolazone  2.5 mg Oral Once per day on Mon Wed Fri   metoprolol tartrate  12.5 mg Oral BID    pantoprazole (PROTONIX) IV  40 mg Intravenous Q12H   pravastatin  20 mg Oral Daily   sertraline  50 mg Oral Daily   spironolactone  100 mg Oral Daily   tamsulosin  0.4 mg Oral Daily   torsemide  100 mg Oral BID   Continuous Inpatient Infusions  octreotide  (SANDOSTATIN)    IV infusion 50 mcg/hr (04/07/21 0315)   PRN Inpatient Medications albuterol, zolpidem   Physical Examination  BP 103/60 (BP Location: Left Arm)    Pulse 68    Temp 99.2 F (37.3 C) (Oral)    Resp 15    Ht 6' 3"  (1.905 m)    Wt (!) 176 kg    SpO2 96%    BMI 48.50 kg/m  GEN: Chronically ill-appearing but nontoxic, no acute distress PSYCH: Cooperative, without pressured speech EYE: Conjunctivae pink, sclerae anicteric ENT: MMM CV: Nontachycardic RESP: Decreased breath sounds at the bases bilaterally GI: NABS, protuberant abdomen, rounded, unable to appreciate hepatosplenomegaly due to significant body habitus  GU: DRE deferred by patient MSK/EXT: Lower extremity edema bilaterally SKIN: Spider angiomata noted on upper thorax, no jaundice NEURO:  Alert & Oriented x 3, no focal deficits, no evidence of asterixis   Review of Data  I reviewed the following data at the time of this encounter:  Laboratory Studies   Recent Labs  Lab 04/07/21 0740  NA 138  K 3.3*  CL 95*  CO2 31  BUN 20  CREATININE 1.20  GLUCOSE 143*  CALCIUM 8.7*   Recent Labs  Lab 04/07/21 0740  AST 59*  ALT 24  ALKPHOS 78    Recent Labs  Lab 04/06/21 1454 04/07/21 0740  WBC 7.5 6.3  HGB 9.7* 9.3*  HCT 32.6* 31.3*  PLT 150 152   Recent Labs  Lab 04/06/21 1652  INR 1.3*   MELD-Na score: 11 at 04/07/2021  7:40 AM MELD score: 11 at 04/07/2021  7:40 AM Calculated from: Serum Creatinine: 1.20 mg/dL at 04/07/2021  7:40 AM Serum Sodium: 138 mmol/L (Using max of 137 mmol/L) at 04/07/2021  7:40 AM Total Bilirubin: 1.1 mg/dL at 04/07/2021  7:40 AM INR(ratio): 1.3 at 04/06/2021  4:52 PM Age: 85 years  Imaging Studies   Abdominal ultrasound done yesterday IMPRESSION: Cirrhotic liver with splenomegaly.   Inadequate visualization of pancreas and abdominal aorta.  GI Procedures and Studies  2020 EGD - Very mild portal hypertensive gastropathy. - No esophageal or gastric varices. - Small, non-bleeding gastric ulcer with no stigmata of bleeding in an area of localized erythema. Location is suspicious for NG tube trauma. Did he have an NG tube at the outside hospital? - Normal examined duodenum. - No specimens collected. - No blood or heme seen.   Assessment  Mr. Jarosz is a 65 y.o. male with a pmh significant for CHF, A. fib, hypertension, hyperlipidemia, OSA, fatty liver disease (based on imaging concern for splenomegaly and cirrhotic and previous EGD showing portal gastropathy appearing liver thus likely chronic liver disease).  The GI service is consulted for evaluation and management of anemia, dark stools, chronic liver disease.  The patient is hemodynamically stable today.  Hemoglobin has down trended but he has not had overt CGE or hematemesis.  Last bowel movement was yesterday.  He has most definitely cirrhosis and it may well be contribution from metabolic associated fatty liver as well as cardiogenic.  In either case, his imaging is suggestive of this.  An upper endoscopy to ensure that he has no evidence of varices as a source for his blood loss anemia is reasonable next step and we will plan for that tomorrow as long as he is hemodynamically stable.  He will remain on octreotide and ceftriaxone and PPI until then.  If this turns out to just be PHG related then that would be the best of situations.  If he has no etiology for his symptoms/dark stools/melena/anemia then he may need a colonoscopy.  The risks and benefits of endoscopic evaluation were discussed with the patient; these include but are not limited to the risk of perforation, infection, bleeding, missed lesions, lack of diagnosis, severe  illness requiring hospitalization, as well as anesthesia and sedation related illnesses.  The patient and/or family is agreeable to proceed.  All patient questions were answered to the best of my ability, and the patient agrees to the aforementioned plan of action with follow-up as indicated.   Plan/Recommendations  #Concern for Variceal UGIB -Continue IV Protonix Drip @8  mcg/hr -Continue IV Octreotide Drip @50  mcg/hr -Continue IV Ceftriaxone 1000-2000 mg daily (if varices noted then will continue treatment for at least 5 days as has been shown to decrease SBP and other infectious complications in Cirrhotics) -Maintain >= 2 large bore IVs (20 gauge) -Will plan for tentative EGD on 12/27 -CLD today - Trend Hgb/Hct -Hepatitis serologies have been ordered -If significant bleeding and hypotension or hemodynamic compromise occurs, please alert the GI Team to see if a more emergent scope has to be completed    Thank you for this consult.  We will continue to follow.  Please page/call with questions or concerns.   Justice Britain, MD [facility] Advanced [facility]oscopy Office # [phone]

## 2021-04-07 NOTE — TOC Progression Note (Signed)
Transition of Care [facility]) - Progression Note    Patient Details  Name: Charles Gray MRN: [id number] Date of Birth: [birth year]  Transition of Care [facility]) CM/SW Contact  Zenon Mayo, RN Phone Number: 04/07/2021, 4:10 PM  Clinical Narrative:     Transition of Care [facility]) Screening Note   Patient Details  Name: Charles Gray Date of Birth: 1955/11/08   Transition of Care [facility]) CM/SW Contact:    Zenon Mayo, RN Phone Number: 04/07/2021, 4:10 PM    Transition of Care Department [facility]) has reviewed patient and no TOC needs have been identified at this time. We will continue to monitor patient advancement through interdisciplinary progression rounds. If new patient transition needs arise, please place a TOC consult.          Expected Discharge Plan and Services                                                 Social Determinants of Health (SDOH) Interventions    Readmission Risk Interventions Readmission Risk Prevention Plan 09/26/2020 06/12/2020 03/27/2020  Transportation Screening Complete Complete Complete  HRI or [place] for [name] Planning/Counseling - - -  Medication Review Press photographer) Complete Complete Complete  PCP or Specialist appointment within 3-5 days of discharge Complete - Complete  HRI or [place] Complete Complete Complete  SW Recovery Care/Counseling Consult Complete Complete Complete  Palliative Care Screening Not Applicable Not Applicable Not [place] Not Applicable Not Applicable Not Applicable

## 2021-04-08 ENCOUNTER — Inpatient Hospital Stay (HOSPITAL_COMMUNITY): Payer: Medicare HMO | Admitting: Anesthesiology

## 2021-04-08 ENCOUNTER — Encounter (HOSPITAL_COMMUNITY): Payer: Self-pay | Admitting: Internal Medicine

## 2021-04-08 ENCOUNTER — Encounter (HOSPITAL_COMMUNITY)
Admission: EM | Disposition: A | Discharge status: Home / Self Care | Payer: Self-pay | Source: Home / Self Care | Attending: Internal Medicine

## 2021-04-08 DIAGNOSIS — K921 Melena: Secondary | ICD-10-CM | POA: Diagnosis not present

## 2021-04-08 DIAGNOSIS — K317 Polyp of stomach and duodenum: Secondary | ICD-10-CM

## 2021-04-08 DIAGNOSIS — K766 Portal hypertension: Secondary | ICD-10-CM | POA: Diagnosis not present

## 2021-04-08 HISTORY — PX: BIOPSY: SHX5522

## 2021-04-08 HISTORY — PX: ESOPHAGOGASTRODUODENOSCOPY: SHX5428

## 2021-04-08 LAB — CBC
HCT: 31.6 % — ABNORMAL LOW (ref 39.0–52.0)
Hemoglobin: 9.3 g/dL — ABNORMAL LOW (ref 13.0–17.0)
MCH: 22.6 pg — ABNORMAL LOW (ref 26.0–34.0)
MCHC: 29.4 g/dL — ABNORMAL LOW (ref 30.0–36.0)
MCV: 76.9 fL — ABNORMAL LOW (ref 80.0–100.0)
Platelets: 160 10*3/uL (ref 150–400)
RBC: 4.11 MIL/uL — ABNORMAL LOW (ref 4.22–5.81)
RDW: 16.4 % — ABNORMAL HIGH (ref 11.5–15.5)
WBC: 7.8 10*3/uL (ref 4.0–10.5)
nRBC: 0 % (ref 0.0–0.2)

## 2021-04-08 LAB — IRON AND TIBC
Iron: 41 ug/dL — ABNORMAL LOW (ref 45–182)
Saturation Ratios: 7 % — ABNORMAL LOW (ref 17.9–39.5)
TIBC: 584 ug/dL — ABNORMAL HIGH (ref 250–450)
UIBC: 543 ug/dL

## 2021-04-08 LAB — BASIC METABOLIC PANEL
Anion gap: 10 (ref 5–15)
BUN: 14 mg/dL (ref 8–23)
CO2: 31 mmol/L (ref 22–32)
Calcium: 8.5 mg/dL — ABNORMAL LOW (ref 8.9–10.3)
Chloride: 94 mmol/L — ABNORMAL LOW (ref 98–111)
Creatinine, Ser: 1.14 mg/dL (ref 0.61–1.24)
GFR, Estimated: >60 mL/min (ref >60)
Glucose, Bld: 127 mg/dL — ABNORMAL HIGH (ref 70–99)
Potassium: 3.3 mmol/L — ABNORMAL LOW (ref 3.5–5.1)
Sodium: 135 mmol/L (ref 135–145)

## 2021-04-08 LAB — MAGNESIUM: Magnesium: 2.1 mg/dL (ref 1.7–2.4)

## 2021-04-08 LAB — FERRITIN: Ferritin: 25 ng/mL (ref 24–336)

## 2021-04-08 SURGERY — EGD (ESOPHAGOGASTRODUODENOSCOPY)
Anesthesia: Monitor Anesthesia Care

## 2021-04-08 MED ORDER — PROPOFOL 10 MG/ML IV BOLUS
INTRAVENOUS | Status: DC | PRN
  Administered 2021-04-08 (×2): 30 mg via INTRAVENOUS
  Administered 2021-04-08: 40 mg via INTRAVENOUS

## 2021-04-08 MED ORDER — LIDOCAINE 2% (20 MG/ML) 5 ML SYRINGE
INTRAMUSCULAR | Status: DC | PRN
  Administered 2021-04-08: 60 mg via INTRAVENOUS

## 2021-04-08 MED ORDER — POTASSIUM CHLORIDE CRYS ER 20 MEQ PO TBCR
40.0000 meq | EXTENDED_RELEASE_TABLET | Freq: Two times a day (BID) | ORAL | Status: AC
  Administered 2021-04-08 (×2): 40 meq via ORAL
  Filled 2021-04-08 (×2): qty 2

## 2021-04-08 MED ORDER — POTASSIUM CHLORIDE 10 MEQ/100ML IV SOLN
10.0000 meq | INTRAVENOUS | Status: DC
  Filled 2021-04-08: qty 100

## 2021-04-08 MED ORDER — PROPOFOL 500 MG/50ML IV EMUL
INTRAVENOUS | Status: DC | PRN
  Administered 2021-04-08: 100 ug/kg/min via INTRAVENOUS

## 2021-04-08 MED ORDER — PHENYLEPHRINE 40 MCG/ML (10ML) SYRINGE FOR IV PUSH (FOR BLOOD PRESSURE SUPPORT)
PREFILLED_SYRINGE | INTRAVENOUS | Status: DC | PRN
  Administered 2021-04-08 (×3): 80 ug via INTRAVENOUS

## 2021-04-08 MED ORDER — SODIUM CHLORIDE 0.9 % IV SOLN
250.0000 mg | Freq: Every day | INTRAVENOUS | Status: AC
  Administered 2021-04-08 – 2021-04-09 (×2): 250 mg via INTRAVENOUS
  Filled 2021-04-08 (×2): qty 20

## 2021-04-08 MED ORDER — POLYETHYLENE GLYCOL 3350 17 G PO PACK
17.0000 g | PACK | Freq: Every day | ORAL | Status: DC
  Administered 2021-04-08 – 2021-04-09 (×2): 17 g via ORAL
  Filled 2021-04-08 (×2): qty 1

## 2021-04-08 NOTE — Anesthesia Procedure Notes (Signed)
Procedure Name: MAC Date/Time: 04/08/2021 8:15 AM Performed by: Trinna Post., CRNA Pre-anesthesia Checklist: Patient identified, Emergency Drugs available, Suction available, Patient being monitored and Timeout performed Patient Re-evaluated:Patient Re-evaluated prior to induction Oxygen Delivery Method: Simple face mask Preoxygenation: Pre-oxygenation with 100% oxygen Induction Type: IV induction Placement Confirmation: positive ETCO2

## 2021-04-08 NOTE — Anesthesia Postprocedure Evaluation (Signed)
Anesthesia Post Note  Patient: Charles Gray  Procedure(s) Performed: ESOPHAGOGASTRODUODENOSCOPY (EGD) BIOPSY     Patient location during evaluation: PACU Anesthesia Type: MAC Level of consciousness: awake and alert Pain management: pain level controlled Vital Signs Assessment: post-procedure vital signs reviewed and stable Respiratory status: spontaneous breathing and respiratory function stable Cardiovascular status: stable Postop Assessment: no apparent nausea or vomiting Anesthetic complications: no   No notable events documented.  Last Vitals:  Vitals:   04/08/21 0855 04/08/21 0905  BP: (!) 125/51 (!) 116/51  Pulse: 66 64  Resp: 16 14  Temp:    SpO2: 95% 97%    Last Pain:  Vitals:   04/08/21 0845  TempSrc: Oral  PainSc:                  Whitley Patchen DANIEL

## 2021-04-08 NOTE — Op Note (Addendum)
[facility] Patient Name: Charles Gray Procedure Date : 04/08/2021 MRN: [id number] Attending MD: Ladene Artist , MD Date of Birth: 06/25/1955 CSN: [id number] Age: 65 Admit Type: Inpatient Procedure:                Upper GI endoscopy Indications:              Microcytic anemia, Melena Providers:                Pricilla Riffle. Fuller Plan, MD, Jeanella Cara, RN,                            [place] [facility] Technician, Technician Referring MD:             Velna Ochs, MD Medicines:                Monitored Anesthesia Care Complications:            No immediate complications. Estimated Blood Loss:     Estimated blood loss was minimal. Procedure:                Pre-Anesthesia Assessment:                           - Prior to the procedure, a History and Physical                            was performed, and patient medications and                            allergies were reviewed. The patient's tolerance of                            previous anesthesia was also reviewed. The risks                            and benefits of the procedure and the sedation                            options and risks were discussed with the patient.                            All questions were answered, and informed consent                            was obtained. Prior Anticoagulants: The patient has                            taken Eliquis (apixaban), last dose was 2 days                            prior to procedure. ASA Grade Assessment: III - A                            patient with severe systemic disease. After  reviewing the risks and benefits, the patient was                            deemed in satisfactory condition to undergo the                            procedure.                           After obtaining informed consent, the endoscope was                            passed under direct vision. Throughout the                            procedure, the  patient's blood pressure, pulse, and                            oxygen saturations were monitored continuously. The                            [id number]-H190 ([id number]) Olympus endoscope was introduced                            through the mouth, and advanced to the second part                            of duodenum. The upper GI endoscopy was                            accomplished without difficulty. The patient                            tolerated the procedure well. Scope In: Scope Out: Findings:      The examined esophagus was normal. No varices.      Mild portal hypertensive gastropathy was found in the gastric fundus and       in the gastric body.      Two 3 to 5 mm sessile polyps with no bleeding and no stigmata of recent       bleeding were found in the gastric antrum. Biopsies were taken with a       cold forceps for histology.      The exam of the stomach was otherwise normal. No gastric varices.      The duodenal bulb and second portion of the duodenum were normal. Impression:               - Normal esophagus.                           - Portal hypertensive gastropathy.                           - Two gastric polyps. Biopsied.                           - Normal  duodenal bulb and second portion of the                            duodenum. Recommendation:           - Patient has a contact number available for                            emergencies. The signs and symptoms of potential                            delayed complications were discussed with the                            patient. Return to normal activities tomorrow.                            Written discharge instructions were provided to the                            patient.                           - Resume previous diet.                           - Continue present medications.                           - Resume Eliquis (apixaban) at prior dose no sooner                            than 3 days. Refer to managing  physician for                            further adjustment of therapy.                           - Await pathology results.                           - Portal gastropathy is the suspected source of                            bleeding exacerbated by Eliquis and ASA.                           - No aspirin, ibuprofen, naproxen, or other                            non-steroidal anti-inflammatory drugs unless there                            are no other options.                           - If appropriate  with his other medical problems                            change to a non selective beta blocker such as                            carvediol or nadolol to reduce portal pressures -                            defer decision to primary service.                           - Check iron studies and treat accordingly - defer                            to primary service.                           - GI signing off.                           - GI follow up with Dr. Wilfrid Lund in [place]                            or Dr. Christia Reading Misenheimer in [place]. Procedure Code(s):        --- Professional ---                           [phone], Esophagogastroduodenoscopy, flexible,                            transoral; with biopsy, single or multiple Diagnosis Code(s):        --- Professional ---                           K76.6, Portal hypertension                           K31.89, Other diseases of stomach and duodenum                           K31.7, Polyp of stomach and duodenum                           D62, Acute posthemorrhagic anemia                           K92.1, Melena (includes Hematochezia) CPT copyright 2019 American [employer] Association. All rights reserved. The codes documented in this report are preliminary and upon coder review may  be revised to meet current compliance requirements. Ladene Artist, MD 04/08/2021 9:00:40 AM This report has been signed electronically. Number of Addenda:  0

## 2021-04-08 NOTE — Progress Notes (Signed)
Attempted to start IV potassium per MD orders but pt is refusing ,states he wants to take it in pill form due to IV potassium "burning my veins up". MD on call notified via [place].

## 2021-04-08 NOTE — Interval H&P Note (Signed)
History and Physical Interval Note:  04/08/2021 8:07 AM  Charles Gray  has presented today for surgery, with the diagnosis of Dark Stools, Acute blood loss anemia, Rule out Varices in setting of Cirrhosis.  The various methods of treatment have been discussed with the patient and family. After consideration of risks, benefits and other options for treatment, the patient has consented to  Procedure(s): ESOPHAGOGASTRODUODENOSCOPY (EGD) (N/A) as a surgical intervention.  The patient's history has been reviewed, patient examined, no change in status, stable for surgery.  I have reviewed the patient's chart and labs.  Questions were answered to the patient's satisfaction.     Pricilla Riffle. Fuller Plan

## 2021-04-08 NOTE — Progress Notes (Signed)
Orthopedic Tech Progress Note Patient Details:  Charles Gray [birth year] [id number]  Ortho Devices Type of Ortho Device: Haematologist Ortho Device/Splint Location: BLE Ortho Device/Splint Interventions: Ordered, Application, Adjustment   Post Interventions Patient Tolerated: Well Instructions Provided: Care of device  Janit Pagan 04/08/2021, 1:45 PM

## 2021-04-08 NOTE — Hospital Course (Addendum)
#  Upper GI bleed Patient presented with multiple episodes of dark bowel movements and abdominal pain. He has a hx of GI bleed that required multiple blood transfusions and gastritis/gastric ulcer as noted on EGD in 2020 and has been taking protonix bid. He had no evidence of varices seen on last EGD. Hb on admission 9.7, which is a drop from 11.7 (most recent Hb). GI was consulted from the ED and recommended the patient be started on octreotide and PPI. Received IV Protonix 80 mg in ED and was started and continued on IV protonix 40 mg bid throughout hospitalization. Hb remained stable throughout admission and had EGD with GI on 12/27. EGD showed portal hypertensive gastropathy, which was the likely source of the patient's bleeding; also noted 2 gastric polyps which were biopsied. Iron studies performed and ferritin 25, iron low at 41, and iron sat low at 7%. Total iron deficit ~2.5 g- gave patient IV ferrlecit 250 mg x2 doses. Recommended to follow up with GI in [place].    #Paroxysmal Afib on Eliquis Patient had a brief episode where he went into Afib with RVR in the ED, however, he reverted back to normal sinus rhythm and HR was in the 60s upon evaluation. Will hold his Eliquis in the setting of GI bleed and will resume on 12/30 (3 days after EGD). Patient was hypokalemic during admission, likely secondary to his home diuretics- repleted K as needed.    #Chronic HFpEF Last echo in July with EF of 60-65%. Last RHC in August with normal filling pressures and he had a Cardiomems pressure sensor placed at that time. Has had multiple hospitalizations for acute on chronic HF in the past. Patient has chronic orthopnea and dyspnea on exertion at his baseline, which appears to be stable. He does report an increase in his lower extremity swelling and a subjective increase in weight, although weight is 370 lbs in the ED, which is his approximate dry weight. On Metoprolol succinate 25 mg bid, torsemide 100 mg bid,  eplerenone 100 mg, and metolazone 2.5 mg MWF at home. Resumed home diuretics during hospitalization and 1/2 dose metoprolol in the setting of bradycardia. Heart failure team was made aware patient admitted to the hospital, as he follows with Dr. [name] regularly. Will require outpatient HF team follow up.    #Cirrhosis  AST/ALT within normal limits, although patient has significant RUQ tenderness to palpation. On lactulose tid at home, although patient reports he has only been having 1 bowel movement per day. Has not had any imaging evidence of cirrhosis, but may have been started on lactulose 2/2 to elevated ammonia level. MELD score 14, low risk for 90d mortality. Ultrasound this admission showed cirrhotic liver with splenomegaly. Continued on home lactulose and received IV ceftriaxone for possible SBP.    #Chronic venous stasis dermatitis bilateral lower extremities #Tinea pedis Likely secondary to venous insuffiencey. Does not appear to be actively infected, patient is afebrile, and it would be unusual to have bilateral cellulitis. Unable to palpate lower extremity pulses secondary to his swelling- dopplers showed faint pulses bilaterally. Recommend podiatry consult as an outpatient. Patient would also likely benefit from unna boots.    #Hx of opioid use disorder Secondary to prescription opioids. Continued home suboxone   #Hx PE/DVT On eliquis as noted above. Held in the setting of GI bleed.    #OSA not on CPAP Reportedly cannot tolerate CPAP.

## 2021-04-08 NOTE — Transfer of Care (Signed)
Immediate Anesthesia Transfer of Care Note  Patient: Charles Gray  Procedure(s) Performed: ESOPHAGOGASTRODUODENOSCOPY (EGD) BIOPSY  Patient Location: PACU and Endoscopy Unit  Anesthesia Type:MAC  Level of Consciousness: awake, alert  and oriented  Airway & Oxygen Therapy: Patient Spontanous Breathing and Patient connected to face mask oxygen  Post-op Assessment: Report given to RN and Post -op Vital signs reviewed and stable  Post vital signs: Reviewed and stable  Last Vitals:  Vitals Value Taken Time  BP 101/41 04/08/21 0845  Temp    Pulse 72 04/08/21 0846  Resp 12 04/08/21 0846  SpO2 99 % 04/08/21 0846  Vitals shown include unvalidated device data.  Last Pain:  Vitals:   04/08/21 0844  TempSrc:   PainSc: 0-No pain         Complications: No notable events documented.

## 2021-04-08 NOTE — Plan of Care (Signed)

## 2021-04-08 NOTE — Progress Notes (Signed)
HD#2 SUBJECTIVE:  Patient Summary: Charles Gray is a 65 y.o. with a pertinent PMH of chronic CHF, Afib on Eliquis, s/p cardiomems device, HTN, HLD, liver cirrhosis, previous GI bleed, and OSA who presented with bloody bowel movements and admitted for GI bleed and probable acute on chronic HF.   Overnight Events: No acute events overnight.  Interim History: This is hospital day 2 for Charles Gray who was seen and evaluated at the bedside this morning after his EGD. He reports that he feels well. He has not had a bowel movement since he has been here. No acute complaints today.   OBJECTIVE:  Vital Signs: Vitals:   04/07/21 1527 04/07/21 2017 04/08/21 0039 04/08/21 0519  BP: (!) 97/55 (!) 98/59 106/63 (!) 114/58  Pulse: (!) 52 64 66 70  Resp: 18 20 (!) 22 20  Temp: 98 F (36.7 C) 98.3 F (36.8 C) 98.7 F (37.1 C) 98.5 F (36.9 C)  TempSrc: Oral Oral Oral Oral  SpO2: 98% 100% 97% 95%  Weight:    (!) 175.8 kg  Height:       Supplemental O2: Room Air SpO2: 95 %  Filed Weights   04/06/21 1423 04/07/21 0411 04/08/21 0519  Weight: (!) 170.1 kg (!) 176 kg (!) 175.8 kg     Intake/Output Summary (Last 24 hours) at 04/08/2021 0557 Last data filed at 04/08/2021 0400 Gross per 24 hour  Intake 2805.9 ml  Output 4775 ml  Net -1969.1 ml   Net IO Since Admission: -2,745.17 mL [04/08/21 0557]  Physical Exam: General: Pleasant, obese male laying in bed. No acute distress. CV: RRR. No murmurs, rubs, or gallops. 2-3+ bilateral LE pitting edema extending to thighs Pulmonary: Lungs CTAB in upper and middle lung fields, diminished air movement in lung bases.  Abdominal: Soft, nontender, protuberant abdomen. Normal bowel sounds. Extremities: Palpable radial pulses. Normal ROM. Skin: Warm and dry. Venous stasis dermatitis in bilateral lower extremities extending below knees, with skin stretching noted.  Neuro: A&Ox3. Moves all extremities. No focal deficit. Psych: Normal mood and  affect     ASSESSMENT/PLAN:  Assessment: Principal Problem:   GI bleed   Plan: #Upper GI bleed Patient presented with multiple episodes of dark bowel movements and abdominal pain. He has a hx of GI bleed that required multiple blood transfusions and gastritis/gastric ulcer as noted on EGD in 2020. Patient has been taking protonix 40 mg bid. Hb remains stable today at 9.3. EGD showed portal hypertensive gastropathy which was the likely source of bleeding; also noted 2 gastric polyps which were biopsied.  - GI signed off; will require GI follow up as an outpatient - Continue IV octreotide - Continue IV protonix 40 mg bid  - Continue IV ceftriaxone  - Advance to regular diet - EGD pathology pending - Daily CMP and CBC; check iron studies - Transfuse if Hb <7   #Paroxysmal Afib on Eliquis Patient had a brief episode where he went into Afib with RVR in the ED, however, he reverted back to normal sinus rhythm. Remains in NSR. Eliquis remains held in setting of GI bleed. Hypokalemia still present, at 3.3, likely secondary to diuretics. Mg within normal limits.  - Cardiac monitoring - Hold eliquis in setting of bleed; will resume in 3 days after EGD - Continue metoprolol to 12.5 mg bid (1/2 home dose) - Oral potassium supplementation today  - Maintain K>4, Mg >2   #Chronic HFpEF Last echo in July with EF of 60-65%. Last RHC in  August with normal filling pressures and he had a Cardiomems pressure sensor placed at that time. On Metoprolol succinate 25 mg bid, torsemide 100 mg bid, eplerenone 100 mg, and metolazone 2.5 mg MWF at home. Follows with Dr. [name] regularly.  - Heart failure team made aware patient admitted - Continue metoprolol at 12.5 mg bid  - Continue home eplerenone, torsemide, and metolazone  - Strict I&Os and daily weights   #Cirrhosis  On lactulose tid at home, although patient reports he has only been having 1 bowel movement per day. MELD score 14, low risk for 90d  mortality.  - Continue lactulose 10 g tid - Hepatitis serologies pending   #Chronic venous stasis dermatitis bilateral lower extremities #Tinea pedis Likely secondary to venous insuffiencey.  - Place unna boots - Consider podiatry consult as an outpatient   #Hx of opioid use disorder Secondary to prescription opioids.  - Continue suboxone   #Hx PE/DVT On eliquis as noted above.   #OSA not on CPAP Reportedly cannot tolerate CPAP.  Best Practice: Diet: Regular diet IVF: Fluids: none VTE: SCDs Start: 04/06/21 1746 Code: Full AB: Ceftriaxone DISPO: Anticipated discharge to Home in 1 days pending Medical stability.  Signature: Buddy Duty, D.O.  Internal Medicine Resident, PGY-1 [facility]  Pager: [phone] 5:57 AM, 04/08/2021   Please contact the on call pager after 5 pm and on weekends at [phone].

## 2021-04-09 ENCOUNTER — Encounter: Payer: Self-pay | Admitting: Gastroenterology

## 2021-04-09 ENCOUNTER — Telehealth (HOSPITAL_COMMUNITY): Payer: Self-pay

## 2021-04-09 ENCOUNTER — Encounter (HOSPITAL_COMMUNITY): Payer: Self-pay | Admitting: Gastroenterology

## 2021-04-09 DIAGNOSIS — K922 Gastrointestinal hemorrhage, unspecified: Secondary | ICD-10-CM | POA: Diagnosis not present

## 2021-04-09 LAB — CBC
HCT: 31.5 % — ABNORMAL LOW (ref 39.0–52.0)
Hemoglobin: 9.2 g/dL — ABNORMAL LOW (ref 13.0–17.0)
MCH: 22.3 pg — ABNORMAL LOW (ref 26.0–34.0)
MCHC: 29.2 g/dL — ABNORMAL LOW (ref 30.0–36.0)
MCV: 76.5 fL — ABNORMAL LOW (ref 80.0–100.0)
Platelets: 155 10*3/uL (ref 150–400)
RBC: 4.12 MIL/uL — ABNORMAL LOW (ref 4.22–5.81)
RDW: 16.4 % — ABNORMAL HIGH (ref 11.5–15.5)
WBC: 6.9 10*3/uL (ref 4.0–10.5)
nRBC: 0 % (ref 0.0–0.2)

## 2021-04-09 LAB — BASIC METABOLIC PANEL
Anion gap: 10 (ref 5–15)
BUN: 11 mg/dL (ref 8–23)
CO2: 31 mmol/L (ref 22–32)
Calcium: 8.4 mg/dL — ABNORMAL LOW (ref 8.9–10.3)
Chloride: 95 mmol/L — ABNORMAL LOW (ref 98–111)
Creatinine, Ser: 1.04 mg/dL (ref 0.61–1.24)
GFR, Estimated: >60 mL/min (ref >60)
Glucose, Bld: 110 mg/dL — ABNORMAL HIGH (ref 70–99)
Potassium: 3.5 mmol/L (ref 3.5–5.1)
Sodium: 136 mmol/L (ref 135–145)

## 2021-04-09 LAB — HEPATITIS B SURFACE ANTIBODY, QUANTITATIVE: Hep B S AB Quant (Post): <3.1 m[IU]/mL — ABNORMAL LOW (ref >9.9)

## 2021-04-09 LAB — HCV AB W REFLEX TO QUANT PCR: HCV Ab: 0.2 s/co ratio (ref 0.0–0.9)

## 2021-04-09 LAB — SURGICAL PATHOLOGY

## 2021-04-09 LAB — MAGNESIUM: Magnesium: 2 mg/dL (ref 1.7–2.4)

## 2021-04-09 LAB — HCV INTERPRETATION

## 2021-04-09 MED ORDER — APIXABAN 5 MG PO TABS: 5.0000 mg | ORAL_TABLET | Freq: Two times a day (BID) | ORAL | 0 refills | Status: DC

## 2021-04-09 MED ORDER — PANTOPRAZOLE SODIUM 40 MG PO TBEC
40.0000 mg | DELAYED_RELEASE_TABLET | Freq: Two times a day (BID) | ORAL | Status: DC
  Administered 2021-04-09: 11:00:00 40 mg via ORAL
  Filled 2021-04-09: qty 1

## 2021-04-09 NOTE — Progress Notes (Signed)
Notified case manager Marta Lamas that pt now states he has home PT ordered prior to hospitalization. Pt is scheduled for discharge home today and this is the first time primary nurse (have had him for the last 3 days) was informed that he has physical therapy at home. CM states she will follow up and verify.

## 2021-04-09 NOTE — Plan of Care (Signed)
Problem: Education: Goal: Knowledge of General Education information will improve Description: Including pain rating scale, medication(s)/side effects and non-pharmacologic comfort measures 04/09/2021 1545 by Serafina Mitchell, RN Outcome: Completed/Met 04/09/2021 1303 by Serafina Mitchell, RN Outcome: Progressing   Problem: Health Behavior/Discharge Planning: Goal: Ability to manage health-related needs will improve 04/09/2021 1545 by Serafina Mitchell, RN Outcome: Completed/Met 04/09/2021 1303 by Serafina Mitchell, RN Outcome: Progressing   Problem: Clinical Measurements: Goal: Ability to maintain clinical measurements within normal limits will improve 04/09/2021 1545 by Serafina Mitchell, RN Outcome: Completed/Met 04/09/2021 1303 by Serafina Mitchell, RN Outcome: Progressing Goal: Will remain free from infection 04/09/2021 1545 by Serafina Mitchell, RN Outcome: Completed/Met 04/09/2021 1303 by Serafina Mitchell, RN Outcome: Progressing Goal: Diagnostic test results will improve 04/09/2021 1545 by Serafina Mitchell, RN Outcome: Completed/Met 04/09/2021 1303 by Lora Paula D, RN Outcome: Progressing Goal: Respiratory complications will improve 04/09/2021 1545 by Serafina Mitchell, RN Outcome: Completed/Met 04/09/2021 1303 by Serafina Mitchell, RN Outcome: Progressing Goal: Cardiovascular complication will be avoided 04/09/2021 1545 by Serafina Mitchell, RN Outcome: Completed/Met 04/09/2021 1303 by Serafina Mitchell, RN Outcome: Progressing   Problem: Activity: Goal: Risk for activity intolerance will decrease 04/09/2021 1545 by Serafina Mitchell, RN Outcome: Completed/Met 04/09/2021 1303 by Serafina Mitchell, RN Outcome: Progressing   Problem: Nutrition: Goal: Adequate nutrition will be maintained 04/09/2021 1545 by Serafina Mitchell, RN Outcome: Completed/Met 04/09/2021 1303 by Lora Paula D, RN Outcome:  Progressing   Problem: Coping: Goal: Level of anxiety will decrease 04/09/2021 1545 by Serafina Mitchell, RN Outcome: Completed/Met 04/09/2021 1303 by Serafina Mitchell, RN Outcome: Progressing   Problem: Elimination: Goal: Will not experience complications related to bowel motility 04/09/2021 1545 by Serafina Mitchell, RN Outcome: Completed/Met 04/09/2021 1303 by Serafina Mitchell, RN Outcome: Progressing Goal: Will not experience complications related to urinary retention 04/09/2021 1545 by Serafina Mitchell, RN Outcome: Completed/Met 04/09/2021 1303 by Serafina Mitchell, RN Outcome: Progressing   Problem: Pain Managment: Goal: General experience of comfort will improve 04/09/2021 1545 by Serafina Mitchell, RN Outcome: Completed/Met 04/09/2021 1303 by Serafina Mitchell, RN Outcome: Progressing   Problem: Safety: Goal: Ability to remain free from injury will improve 04/09/2021 1545 by Serafina Mitchell, RN Outcome: Completed/Met 04/09/2021 1303 by Serafina Mitchell, RN Outcome: Progressing   Problem: Skin Integrity: Goal: Risk for impaired skin integrity will decrease 04/09/2021 1545 by Serafina Mitchell, RN Outcome: Completed/Met 04/09/2021 1303 by Serafina Mitchell, RN Outcome: Progressing

## 2021-04-09 NOTE — Telephone Encounter (Signed)
Spoke to Leslie who agreed on home visit for 0830 in the morning. Call complete.

## 2021-04-09 NOTE — Evaluation (Signed)
Physical Therapy Evaluation Patient Details Name: Charles Gray MRN: [id number] DOB: 1955/11/30 Today's Date: 04/09/2021  History of Present Illness  Pt is a 65 y/o male presenting 04/06/21 with GI bleed, low hemoglobin requiring transfusions and underwent EGD on 12/27. PMH: CHF, A. fib, hypertension, hyperlipidemia, OSA, fatty liver disease   Clinical Impression  Pt presents with condition above and deficits mentioned below, see PT Problem List. PTA, he was mod I, intermittently using his new bariatric RW or rollator for mobility but otherwise no AD for mobility at home. Pt lives alone in an apartment with at least 20 STE with bil handrails. He sometimes has to sit on the stairs when he fatigues. Currently, pt displays deficits in lower extremity strength, muscular and aerobic endurance, and balance. He reports he has fallen 1x in the past 6 months. He was able to ambulate short household distances with a rollator and navigate x4 stairs with bil handrails at a min guard-supervision level this date. Pt would benefit from further acute PT services and follow-up with HHPT to maximize his return to baseline and improve his strength and endurance and reduce his risk for subsequent falls.      Recommendations for follow up therapy are one component of a multi-disciplinary discharge planning process, led by the attending physician.  Recommendations may be updated based on patient status, additional functional criteria and insurance authorization.  Follow Up Recommendations Home health PT    Assistance Recommended at Discharge PRN  Functional Status Assessment Patient has had a recent decline in their functional status and demonstrates the ability to make significant improvements in function in a reasonable and predictable amount of time.  Equipment Recommendations  Hospital bed (needs a longer one for tall stature)    Recommendations for Other Services       Precautions / Restrictions  Precautions Precautions: Fall;Other (comment) Precaution Comments: relatively low fall risk Restrictions Weight Bearing Restrictions: No      Mobility  Bed Mobility               General bed mobility comments: Pt sitting EOB upon arrival.    Transfers Overall transfer level: Modified independent Equipment used: Rollator (4 wheels)               General transfer comment: Cues provided on how to lock rollator brakes with transfers, good carryover noted. No LOB, able to perform transfers with mod I.    Ambulation/Gait Ambulation/Gait assistance: Supervision Gait Distance (Feet): 65 Feet (x3 bouts of ~40 ft > ~65 ft > ~5 ft) Assistive device: Rollator (4 wheels) Gait Pattern/deviations: Step-through pattern;Decreased stride length;Trunk flexed Gait velocity: reduced Gait velocity interpretation: <1.31 ft/sec, indicative of household ambulator   General Gait Details: Pt with slow gait and trunk flexed, likely due to abdominal pain and need to flex trunk to reach rollator handles due to tall stature. Pt fatigues quickly, no LOB, supervision for safety.  Stairs Stairs: Yes Stairs assistance: Min guard Stair Management: Two rails;Alternating pattern;Forwards;Step to pattern Number of Stairs: 4 General stair comments: Ascends forwards with reciprocal pattern and bil rail use, extra time, no LOB. Descends forwards with step-to pattern and bil rail use, extra time, no LOB.  Wheelchair Mobility    Modified Rankin (Stroke Patients Only)       Balance Overall balance assessment: Needs assistance Sitting-balance support: No upper extremity supported;Feet supported Sitting balance-Leahy Scale: Good     Standing balance support: No upper extremity supported;During functional activity;Reliant on assistive device for balance Standing  balance-Leahy Scale: Good Standing balance comment: able to mobilize without AD short distances, benefits from rollator for energy  conservation                             Pertinent Vitals/Pain Pain Assessment: Faces Faces Pain Scale: Hurts a little bit Pain Location: abdomen Pain Descriptors / Indicators: Discomfort;Guarding Pain Intervention(s): Limited activity within patient's tolerance;Monitored during session;Repositioned    Home Living Family/patient expects to be discharged to:: Private residence Living Arrangements: Alone Available Help at Discharge: Personal care attendant;Neighbor;Available PRN/intermittently Type of Home: Apartment Home Access: Stairs to enter Entrance Stairs-Rails: Right;Left;Can reach both Entrance Stairs-Number of Steps: 20+   Home Layout: One level Home Equipment: Conservation officer, nature (2 wheels);Rollator (4 wheels);Tub bench;Hand held shower head;Toilet riser;Hospital bed (hospital bed is too short for his stature) Additional Comments: aide assist 3 hrs/daily, x5 days/week    Prior Function Prior Level of Function : Needs assist;History of Falls (last six months)       Physical Assist : Mobility (physical);ADLs (physical) Mobility (physical): Transfers ADLs (physical): Bathing;IADLs Mobility Comments: use of RW vs new use of Rollator for mobility. occasional assist with tub transfers with tub bench. Has to sit on stairs occasionally due to fatigue. x1 fall in past 6 months ADLs Comments: Able to complete ADLs typically without assist. occasional aide assist with tub transfers, showering and brushing hair due to fatigue. Aide assists with household IADLs     Hand Dominance   Dominant Hand: Right    Extremity/Trunk Assessment   Upper Extremity Assessment Upper Extremity Assessment: Defer to OT evaluation    Lower Extremity Assessment Lower Extremity Assessment: Generalized weakness (MMT scores of 4- bil hip flexion, 4 bil knee extension, 4 bil ankle dorsiflexion; denies numbness/tingling)    Cervical / Trunk Assessment Cervical / Trunk Assessment: Normal   Communication   Communication: No difficulties  Cognition Arousal/Alertness: Awake/alert Behavior During Therapy: WFL for tasks assessed/performed Overall Cognitive Status: Within Functional Limits for tasks assessed                                          General Comments General comments (skin integrity, edema, etc.): SpO2 97% on RA, HR 104 bpm; discussed changing to a ground floor apartment for increased ease of access to home; discussed energy conservation techniques and to have neighbor assist him on stairs at d/c.    Exercises     Assessment/Plan    PT Assessment Patient needs continued PT services  PT Problem List Decreased strength;Decreased activity tolerance;Decreased balance;Decreased mobility;Decreased knowledge of use of DME;Cardiopulmonary status limiting activity;Obesity       PT Treatment Interventions DME instruction;Gait training;Stair training;Functional mobility training;Therapeutic activities;Balance training;Therapeutic exercise;Neuromuscular re-education;Patient/family education    PT Goals (Current goals can be found in the Care Plan section)  Acute Rehab PT Goals Patient Stated Goal: to go home with HHPT PT Goal Formulation: With patient Time For Goal Achievement: 04/23/21 Potential to Achieve Goals: Good    Frequency Min 3X/week   Barriers to discharge        Co-evaluation               AM-PAC PT "6 Clicks" Mobility  Outcome Measure Help needed turning from your back to your side while in a flat bed without using bedrails?: None Help needed moving from lying on your back  to sitting on the side of a flat bed without using bedrails?: None Help needed moving to and from a bed to a chair (including a wheelchair)?: None Help needed standing up from a chair using your arms (e.g., wheelchair or bedside chair)?: None Help needed to walk in hospital room?: A Little Help needed climbing 3-5 steps with a railing? : A Little 6  Click Score: 22    End of Session   Activity Tolerance: Patient tolerated treatment well;Patient limited by fatigue Patient left: in bed;with call bell/phone within reach Nurse Communication: Mobility status PT Visit Diagnosis: Unsteadiness on feet (R26.81);Other abnormalities of gait and mobility (R26.89);Muscle weakness (generalized) (M62.81);History of falling (Z91.81);Difficulty in walking, not elsewhere classified (R26.2)    Time: 1419-1450 PT Time Calculation (min) (ACUTE ONLY): 31 min   Charges:   PT Evaluation $PT Eval Low Complexity: 1 Low PT Treatments $Gait Training: 8-22 mins        Moishe Spice, PT, DPT Acute Rehabilitation Services  Pager: [phone] Office: [phone]   Orvan Falconer 04/09/2021, 3:06 PM

## 2021-04-09 NOTE — TOC Transition Note (Addendum)
Transition of Care [facility]) - CM/SW Discharge Note   Patient Details  Name: Charles Gray MRN: [id number] Date of Birth: 01-Sep-1955  [facility]) CM/SW Contact:  Angelita Ingles, RN Phone Number:[phone]  04/09/2021, 12:44 PM   Clinical Narrative:    CM received call from nurse stating that patient states that he was receiving [facility] prior to admission to the hospitl. CM at bedside and patient states that he was receiving HH with [facility]. CM called [place] hospital liaison with [facility] to verify active home health services. Stacie has to confirm with the office and will return call.   1320 CM spoke with Stacie at [facility] Patient completed [place] course on 12/27. Per [facility] patient can resume services with new orders if the patient is deconditioned. Message has been sent to nurse and MD. PT to evaluate patient. Awaiting  recommendations.  95 HH has been set up with [facility].         Patient Goals and CMS Choice        Discharge Placement                       Discharge Plan and Services                                     Social Determinants of Health (SDOH) Interventions     Readmission Risk Interventions Readmission Risk Prevention Plan 09/26/2020 06/12/2020 03/27/2020  Transportation Screening Complete Complete Complete  HRI or [place] for [place] Planning/Counseling - - -  Medication Review Press photographer) Complete Complete Complete  PCP or Specialist appointment within 3-5 days of discharge Complete - Complete  HRI or [place] Complete Complete Complete  SW Recovery Care/Counseling Consult Complete Complete Complete  Palliative Care Screening Not Applicable Not Applicable Not [place] Not Applicable Not Applicable Not Applicable

## 2021-04-09 NOTE — Discharge Summary (Signed)
Name: Charles Gray MRN: [id number] DOB: 11/19/1955 65 y.o. PCP: Enid Skeens., MD  Date of Admission: 04/06/2021  2:12 PM Date of Discharge:   04/09/21 Attending Physician: Velna Ochs, MD  Discharge Diagnosis: 1. Upper GI bleed 2. Paroxysmal Afib on Eliquis 3. Chronic HFpEF 4. Cirrhosis  5. Chronic venous stasis dermatitis bilateral lower extremities;Tinea pedis 6. Hx of opioid use disorder 7. Hx PE/DVT  Discharge Medications: Allergies as of 04/09/2021       Reactions   Penicillins Other (See Comments)   Caused convulsions as a child  Did it involve swelling of the face/tongue/throat, SOB, or low BP? N/A Did it involve sudden or severe rash/hives, skin peeling, or any reaction on the inside of your mouth or nose? N/A Did you need to seek medical attention at a hospital or doctor's office?N/A When did it last happen? N/A If all above answers are "NO", may proceed with cephalosporin use.        Medication List     TAKE these medications    acetaminophen 500 MG tablet Commonly known as: TYLENOL Take 500-1,000 mg by mouth every 6 (six) hours as needed for mild pain, moderate pain or headache.   apixaban 5 MG Tabs tablet Commonly known as: Eliquis Take 1 tablet (5 mg total) by mouth 2 (two) times daily. Start taking on: April 11, 2021 What changed: These instructions start on April 11, 2021. If you are unsure what to do until then, ask your doctor or other care provider.   Buprenorphine HCl-Naloxone HCl 8-2 MG Film Place 1 tablet under the tongue 2 (two) times daily.   eplerenone 50 MG tablet Commonly known as: INSPRA TAKE 2 TABLETS BY MOUTH DAILY What changed: when to take this   gabapentin 800 MG tablet Commonly known as: NEURONTIN Take 1 tablet (800 mg total) by mouth 3 (three) times daily.   lactulose 10 GM/15ML solution Commonly known as: CHRONULAC Take 15 mLs (10 g total) by mouth 3 (three) times daily.   metolazone 2.5 MG  tablet Commonly known as: ZAROXOLYN Take 1 tablet (2.5 mg total) by mouth 3 (three) times a week. Monday, Wednesday and Fridays What changed:  when to take this additional instructions   metoprolol succinate 25 MG 24 hr tablet Commonly known as: Toprol XL Take 1 tablet (25 mg total) by mouth 2 (two) times daily.   ondansetron 4 MG disintegrating tablet Commonly known as: ZOFRAN-ODT TAKE 1 TABLET BY MOUTH EVERY 8 HOURS AS NEEDED FOR NAUSEA OR VOMITING What changed:  how to take this reasons to take this   pantoprazole 40 MG tablet Commonly known as: PROTONIX Take 40 mg by mouth 2 (two) times daily before a meal.   polyethylene glycol 17 g packet Commonly known as: MIRALAX / GLYCOLAX Take 17 g by mouth as needed for mild constipation (mix and drink as directed).   potassium chloride SA 20 MEQ tablet Commonly known as: KLOR-CON M TAKE 5 TABLETS BY MOUTH TWICE DAILY. EXTRA 40 MEQ WITH METOLAZONE DAYS. What changed: See the new instructions.   pravastatin 20 MG tablet Commonly known as: PRAVACHOL Take 1 tablet (20 mg total) by mouth daily. What changed: when to take this   sertraline 50 MG tablet Commonly known as: ZOLOFT Take 1 tablet (50 mg total) by mouth daily.   sodium chloride 0.65 % Soln nasal spray Commonly known as: OCEAN Place 1 spray into both nostrils as needed for congestion.   tamsulosin 0.4 MG Caps capsule Commonly  known as: FLOMAX Take 1 capsule (0.4 mg total) by mouth daily.   torsemide 20 MG tablet Commonly known as: DEMADEX Take 100 mg by mouth See admin instructions. Take 100 mg by mouth in the morning and evening What changed: Another medication with the same name was removed. Continue taking this medication, and follow the directions you see here.   triamcinolone 0.025 % ointment Commonly known as: KENALOG Apply 1 application topically 2 (two) times daily. What changed:  when to take this reasons to take this   Ventolin HFA 108 (90 Base)  MCG/ACT inhaler Generic drug: albuterol Inhale 1 puff into the lungs every 4 (four) hours as needed for shortness of breath. What changed: how much to take   zolpidem 10 MG tablet Commonly known as: AMBIEN Take 1 tablet (10 mg total) by mouth at bedtime as needed for sleep. What changed: when to take this        Disposition and follow-up:   Charles Gray was discharged from [facility] in Stable condition.  At the hospital follow up visit please address:  1.  GI bleed- recheck hemoglobin, assess for further bloody stools  2. Lower extremity swelling- assess volume status and adjust meds as needed  3. Hx of PE/DVT- make sure patient restarted eliquis on 12/30 as directed  2.  Labs / imaging needed at time of follow-up: cbc, bmp   3.  Pending labs/ test needing follow-up: gastric polyp biopsies  Follow-up Appointments:  Follow-up [facility]. Go on 04/28/2021.   Specialty: Gastroenterology Why: At 9:00 for a hospital follow up appointment Contact information: [address] [place] [id number] [phone]               GI follow up with [facility] by problem list: 1. Upper GI bleed; Anemia Patient presented with multiple episodes of dark bowel movements and abdominal pain. He has a hx of GI bleed that required multiple blood transfusions and gastritis/gastric ulcer as noted on EGD in 2020. Patient has been taking protonix 40 mg bid. Hb remains stable today at 9.3, he required no transfusions. EGD showed portal hypertensive gastropathy which was the likely source of bleeding; also noted 2 gastric polyps which were biopsied. No sign of active bleeding was seen during EGD. GI recommended holding eliquis for three days and continuing protonix for outpatient follow up which is scheduled as above. Aspirin was also discontinued during admission (see HF below).  2. Paroxysmal  Afib on Eliquis Patient had a brief episode where he went into Afib with RVR in the ED, however, he reverted back to normal sinus rhythm. Remains in NSR. Eliquis was held in setting of GI bleed. He will restart this as an outpatient on 12/30. Continued his metoprolol at a reduced dose. Will restart full dose upon discharge.  3.Chronic HFpEF Last echo in July with EF of 60-65%. Last RHC in August with normal filling pressures and he had a Cardiomems pressure sensor placed at that time. On Metoprolol succinate 25 mg bid, torsemide 100 mg bid, eplerenone 100 mg, and metolazone 2.5 mg MWF at home. Follows with Dr. [name] regularly. Legs were swollen on exam though patient had no shortness of breath or trouble breathing. He was put in unna boots and will follow up with his cardiologist as an outpatient. Last cardiology note noted that he did not have significant CAD. Given recent GI bleed aspirin was discontinued.  4. Cirrhosis  Continued home lactulose. MELD score 14, low risk of 90 day mortality.  5. Chronic venous stasis dermatitis bilateral lower extremities; Tinea pedis Likely secondary to venous insufficiency. Unna boots placed. Can consider podiatry consult as outpatient.  6. Hx of opioid use disorder Continued home suboxone  7. HX PE/DVT Eliquis held 2/2 acute GI bleed, will restart on 12/30.  Discharge subjective Feeling ok today. Patient has some mild abdominal pain but nothing too bad. He is worried about possibly having another bleed in the future. Is ok with going home today.   Discharge Exam:   BP 140/85 (BP Location: Left Arm)    Pulse 77    Temp 97.8 F (36.6 C) (Oral)    Resp 20    Ht 6' 3"  (1.905 m)    Wt (!) 177.5 kg    SpO2 99%    BMI 48.91 kg/m  Discharge exam:  Gen: Obese appearing elderly gentleman resting comfortably in bed in NAD CV: RRR, no m/r/g Resp: normal WOB on room air Abd: distended, soft, mildly tender Neuro: alert, oriented, no focal deficits Skin: warm  and dry, unna boots in place Psych: normal affect  Pertinent Labs, Studies, and Procedures:   DG Chest 2 View  Result Date: 04/06/2021 CLINICAL DATA:  Chest pain, shortness of breath EXAM: CHEST - 2 VIEW COMPARISON:  02/06/2021 FINDINGS: Stable cardiomediastinal contours. Mildly increased bibasilar interstitial markings. No lobar consolidation. No pleural effusion or pneumothorax. IMPRESSION: Mildly increased bibasilar interstitial markings could reflect bronchitic type lung changes versus atypical infection. Electronically Signed   By: Davina Poke D.O.   On: 04/06/2021 15:23   US Abdomen Complete  Result Date: 04/06/2021 CLINICAL DATA:  Bloody stools, history cirrhosis EXAM: ABDOMEN ULTRASOUND COMPLETE COMPARISON:  None FINDINGS: Gallbladder: Normally distended without stones or wall thickening. No pericholecystic fluid or sonographic Murphy sign. Common bile duct: Diameter: 4 mm, normal Liver: Heterogeneous coarsened increased hepatic echogenicity. No discrete hepatic mass. Portal vein is patent on color Doppler imaging with normal direction of blood flow towards the liver. IVC: Suboptimally visualized due to bowel gas Pancreas: Inadequately visualized due to bowel gas Spleen: Enlarged at 17.0 cm length with calculated volume of 1401 mL. Right Kidney: Length: 13.1 cm. Normal morphology without mass or hydronephrosis. Left Kidney: Length: 11.2 cm. Inferior pole suboptimally visualized. No gross mass or hydronephrosis. Abdominal aorta: Poorly visualized due to bowel gas Other findings: No free fluid in RIGHT upper quadrant. IMPRESSION: Cirrhotic liver with splenomegaly. Inadequate visualization of pancreas and abdominal aorta. Electronically Signed   By: Lavonia Dana M.D.   On: 04/06/2021 19:04    CBC Latest Ref Rng & Units 04/09/2021 04/08/2021 04/07/2021  WBC 4.0 - 10.5 K/uL 6.9 7.8 6.7  Hemoglobin 13.0 - 17.0 g/dL 9.2(L) 9.3(L) 9.4(L)  Hematocrit 39.0 - 52.0 % 31.5(L) 31.6(L) 32.5(L)   Platelets 150 - 400 K/uL 155 160 155    BMP Latest Ref Rng & Units 04/09/2021 04/08/2021 04/07/2021  Glucose 70 - 99 mg/dL 110(H) 127(H) 143(H)  BUN 8 - 23 mg/dL 11 14 20   Creatinine 0.61 - 1.24 mg/dL 1.04 1.14 1.20  BUN/Creat Ratio 10 - 24 - - -  Sodium 135 - 145 mmol/L 136 135 138  Potassium 3.5 - 5.1 mmol/L 3.5 3.3(L) 3.3(L)  Chloride 98 - 111 mmol/L 95(L) 94(L) 95(L)  CO2 22 - 32 mmol/L 31 31 31   Calcium 8.9 - 10.3 mg/dL 8.4(L) 8.5(L) 8.7(L)    Discharge Instructions: Discharge Instructions  Call MD for:  difficulty breathing, headache or visual disturbances   Complete by: As directed    Call MD for:  persistant dizziness or light-headedness   Complete by: As directed    Call MD for:  temperature >100.4   Complete by: As directed    Diet - low sodium heart healthy   Complete by: As directed    Increase activity slowly   Complete by: As directed        Signed: Scarlett Presto, MD 04/09/2021, 11:09 AM   Pager: [phone]

## 2021-04-09 NOTE — Progress Notes (Signed)
Patient is going home today and he wanted to change iron IV to po form. Patient agreed to get po meds rather than new PIV access. Informed patient's RN regarding this matter. HS [employer]

## 2021-04-09 NOTE — Progress Notes (Signed)
Pt discharging  home after PT and OT consults today. He is alert and oriented x 4 and leaving with all of his belongings. He states that someone will bring him his walker downstairs from his apartment. He was told that no one can assist him upstairs that is driving him home.   He has cellphone and charger and IV access has been discontinued. IV site is free of any signs of infection and  infiltration. Pt denies pain.  Pt verified his correct address for transport home. Education was given by provider and nurse. NO signs nor distress is noted upon discharge home.

## 2021-04-09 NOTE — Evaluation (Signed)
Occupational Therapy Evaluation Patient Details Name: Charles Gray MRN: [id number] DOB: 02/18/1956 Today's Date: 04/09/2021   History of Present Illness Pt is a 65 y/o male presenting Gi bleed, low hemoglobin requiring transfusions and underwent EGD on 12/27.PMH: CHF, A. fib, hypertension, hyperlipidemia, OSA, fatty liver disease   Clinical Impression   PTA, pt lives alone and reports typically Modified Independent with household mobility using RW vs Rollator. Pt reports able to complete ADLs but occasionally has aide assist with tub transfer/showering tasks. Aide primarily assists with IADLs. Pt presents now with deficits in activity tolerance primarily, notably SOB after exiting bathroom on OT entry. Pt able to complete ADLs assessed without physical assist, denies need for HHOT again at home. However, pt does report a decrease in mobility/endurance and interested in resumption of HHPT. Will continue to follow acutely to further address endurance and energy conservation during ADLs.      Recommendations for follow up therapy are one component of a multi-disciplinary discharge planning process, led by the attending physician.  Recommendations may be updated based on patient status, additional functional criteria and insurance authorization.   Follow Up Recommendations  No OT follow up    Assistance Recommended at Discharge Intermittent Supervision/Assistance  Functional Status Assessment  Patient has had a recent decline in their functional status and demonstrates the ability to make significant improvements in function in a reasonable and predictable amount of time.  Equipment Recommendations  Other (comment) (reports current hospital bed too short)    Recommendations for Other Services       Precautions / Restrictions Precautions Precautions: Fall;Other (comment) Precaution Comments: relatively low fall risk Restrictions Weight Bearing Restrictions: No      Mobility Bed  Mobility               General bed mobility comments: received in bathroom    Transfers Overall transfer level: Independent Equipment used: None                      Balance Overall balance assessment: Needs assistance Sitting-balance support: No upper extremity supported;Feet supported Sitting balance-Leahy Scale: Good     Standing balance support: No upper extremity supported;During functional activity Standing balance-Leahy Scale: Good Standing balance comment: able to mobilize without AD short distances, benefits from RW for energy conservation                           ADL either performed or assessed with clinical judgement   ADL Overall ADL's : At baseline                                       General ADL Comments: leaving bathroom on entry without use of AD. able to complete toleting task, light assist to tie laces due to swollen feet but pt able to don with figure four position     Vision Baseline Vision/History: 0 No visual deficits Ability to See in Adequate Light: 0 Adequate Patient Visual Report: No change from baseline Vision Assessment?: No apparent visual deficits     Perception     Praxis      Pertinent Vitals/Pain Pain Assessment: No/denies pain     Hand Dominance Right   Extremity/Trunk Assessment Upper Extremity Assessment Upper Extremity Assessment: Overall WFL for tasks assessed   Lower Extremity Assessment Lower Extremity Assessment: Defer to PT evaluation  Cervical / Trunk Assessment Cervical / Trunk Assessment: Normal   Communication Communication Communication: No difficulties   Cognition Arousal/Alertness: Awake/alert Behavior During Therapy: WFL for tasks assessed/performed Overall Cognitive Status: Within Functional Limits for tasks assessed                                       General Comments  SpO2 97% on RA, 2/4 DOE. Reinforced energy conservation    Exercises      Shoulder Instructions      Home Living Family/patient expects to be discharged to:: Private residence Living Arrangements: Alone Available Help at Discharge: Personal care attendant Type of Home: Apartment Home Access: Stairs to enter [employer] of Steps: 20+ Entrance Stairs-Rails: Right;Left;Can reach both Home Layout: One level     Bathroom Shower/Tub: Teacher, early years/pre: Handicapped height Bathroom Accessibility: Yes   Home Equipment: Conservation officer, nature (2 wheels);Rollator (4 wheels);Tub bench;Hand held shower head   Additional Comments: aide assist 3 hrs/daily      Prior Functioning/Environment Prior Level of Function : Needs assist       Physical Assist : Mobility (physical);ADLs (physical) Mobility (physical): Transfers ADLs (physical): Bathing;IADLs Mobility Comments: use of RW vs new use of Rollator for mobility. occasional assist with tub transfers with tub bench ADLs Comments: Able to complete ADLs typically without assist. occasional aide assist with tub transfers, showering and brushing hair due to fatigue. Aide assists with household IADLs        OT Problem List: Decreased strength;Decreased activity tolerance;Impaired balance (sitting and/or standing);Cardiopulmonary status limiting activity;Obesity      OT Treatment/Interventions: Self-care/ADL training;Therapeutic exercise;Energy conservation;DME and/or AE instruction;Therapeutic activities;Balance training;Patient/family education    OT Goals(Current goals can be found in the care plan section) Acute Rehab OT Goals Patient Stated Goal: go home today OT Goal Formulation: With patient Time For Goal Achievement: 04/23/21 Potential to Achieve Goals: Good  OT Frequency: Min 2X/week   Barriers to D/C:            Co-evaluation              AM-PAC OT "6 Clicks" Daily Activity     Outcome Measure Help from another person eating meals?: None Help from another person  taking care of personal grooming?: None Help from another person toileting, which includes using toliet, bedpan, or urinal?: None Help from another person bathing (including washing, rinsing, drying)?: A Little Help from another person to put on and taking off regular upper body clothing?: None Help from another person to put on and taking off regular lower body clothing?: None 6 Click Score: 23   End of Session Nurse Communication: Mobility status  Activity Tolerance: Patient tolerated treatment well Patient left: in bed;with call bell/phone within reach  OT Visit Diagnosis: Unsteadiness on feet (R26.81);Other abnormalities of gait and mobility (R26.89);Muscle weakness (generalized) (M62.81)                Time: 6384-5364 OT Time Calculation (min): 17 min Charges:  OT General Charges $OT Visit: 1 Visit OT Evaluation $OT Eval Low Complexity: 1 Low  Malachy Chamber, OTR/L Acute Rehab Services Office: [phone]   [name] 04/09/2021, 2:12 PM

## 2021-04-09 NOTE — Discharge Instructions (Addendum)
Charles Gray   You were recently admitted to [facility] for a GI bleed. The GI doctors used a scope to look for the source of the bleeding and it looks like irritation of your stomach related to your cirrhosis. We recommend that you continue to follow up with the GI doctors as an outpatient and take your medications as directed.  Continue taking your home medications with the following changes  Do not take your eliquis until 12/30. You will then restart this medication as you normally take it. Stop taking aspirin   You should seek further medical care if you have lightheadedness, dizziness, bright red blood in your stools, dark tarry stools, or extreme fatigue.   We recommend that you see your primary care doctor in about a week to make sure that you continue to improve. We are so glad that you are feeling better.  Sincerely, Scarlett Presto, MD

## 2021-04-09 NOTE — Plan of Care (Signed)

## 2021-04-10 ENCOUNTER — Other Ambulatory Visit (HOSPITAL_COMMUNITY): Payer: Self-pay

## 2021-04-10 NOTE — Progress Notes (Signed)
Paramedicine Encounter    Patient ID: Charles Gray, male    DOB: [birth year], 65 y.o.   MRN: [id number]  Arrived to see [name] for a med rec in the home today after he was discharged from [facility].   I reviewed discharge summary and notes. Meds were reviewed and confirmed. Pill box filled for one week.     Unna Boots in place. I will see [name] in one week on Tuesday 1/3.  Refills: Inspra Zoloft   ACTION: Home visit completed

## 2021-04-15 ENCOUNTER — Other Ambulatory Visit (HOSPITAL_COMMUNITY): Payer: Self-pay

## 2021-04-15 ENCOUNTER — Ambulatory Visit (HOSPITAL_COMMUNITY)
Admission: RE | Admit: 2021-04-15 | Discharge: 2021-04-15 | Disposition: A | Discharge status: Home / Self Care | Payer: Medicare HMO | Source: Ambulatory Visit | Attending: Adult Health | Admitting: Adult Health

## 2021-04-15 ENCOUNTER — Other Ambulatory Visit: Payer: Self-pay

## 2021-04-15 DIAGNOSIS — I5032 Chronic diastolic (congestive) heart failure: Secondary | ICD-10-CM | POA: Diagnosis not present

## 2021-04-15 NOTE — Progress Notes (Signed)
Paramedicine Encounter    Patient ID: Charles Gray, male    DOB: Dec 08, 1955, 66 y.o.   MRN: [id number]   Met with Charles Gray today in the home where he was alert and oriented seated in his recliner. He reports feeling okay but having some lower leg pain. Both lower legs were swollen but improved from last week. Mild redness, dry skin and tenderness noted.   Wraps that were placed by hospital he removed himself. [facility] has not had any nurses come out since his discharge home.   I contacted [place] office who reports Charles Gray referral was transferred to their coverage for [facility] to the [facility] office. I called and spoke to care nurse manager [name] with [facility]. She reports physical therapy will start 1/10. She reports there is no order for [facility] in their system but will be able to schedule this for him if order can be faxed. I called HF triage and had them fax in order to her for same.    Vitals and assessment as noted. Medications reviewed and confirmed. Pill box filled accordingly.   We confirmed appointments. Home visit complete.   Refills: Inspra Flomax    Patient Care Team: Enid Skeens., MD as PCP - General (Family Medicine) Berniece Salines, DO as PCP - Cardiology (Cardiology) Jorge Ny, LCSW as Social Worker (Licensed Clinical Social Worker)  Patient Active Problem List   Diagnosis Date Noted   Melena    Gastric polyps    GI bleed 04/06/2021   Acute encephalopathy 12/21/2020   COPD (chronic obstructive pulmonary disease) ([place]) 12/21/2020   Chronic diastolic (congestive) heart failure ([place]) 11/14/2020   Syncope 10/28/2020   CHF (congestive heart failure) ([place]) 10/28/2020   Chronic venous stasis dermatitis of both lower extremities 08/27/2020   Cellulitis of both lower extremities 08/14/2020   Microcytic anemia 08/14/2020   GERD without esophagitis 06/10/2020   Atrial fibrillation with rapid ventricular response ([place]) 03/24/2020    Acute on chronic right heart failure ([place]) 01/27/2020   Acute respiratory failure with hypoxia ([place])    Fatty liver disease, nonalcoholic    Shock ([place])    Personal history of DVT (deep vein thrombosis) 12/26/2019   Chronic anticoagulation 12/26/2019   Palpitations 12/26/2019   AF (paroxysmal atrial fibrillation) ([place]) 12/23/2019   Heart failure, systolic, acute ([place]) 74/11/1446   Acute on chronic congestive heart failure (HCC)    Chronic diastolic HF (heart failure) ([place]) 08/23/2019   Chest pain 08/23/2019   Lactic acidosis 08/23/2019   History of pulmonary embolism 08/12/2019   Atrial tachycardia ([place]) 08/12/2019   Occasional tremors 08/12/2019   OSA (obstructive sleep apnea) 08/12/2019   Acute on chronic right-sided heart failure ([place]) 08/06/2019   Pulmonary embolism ([employer]) 06/08/2019   Normocytic anemia 06/08/2019   Pulmonary emboli ([place]) 06/08/2019   Chronic heart failure with preserved ejection fraction ([place]) 05/30/2019   Class 2 severe obesity due to excess calories with serious comorbidity and body mass index (BMI) of 39.0 to 39.9 in adult ([place]) 05/30/2019   Morbid obesity with BMI of 40.0-44.9, adult ([place]) 05/30/2019   Hyponatremia    Drug induced constipation    Peripheral edema    Hypotension due to drugs    Hypoalbuminemia due to protein-calorie malnutrition (HCC)    Hypokalemia    Thrombocytopenia (HCC)    Anemia of chronic disease    Cirrhosis of liver without ascites ([place])    Chronic pain syndrome    Debility 03/13/2019  Portal hypertensive gastropathy ([place]) 03/06/2019   Dyspnea    GIB (gastrointestinal bleeding) 03/04/2019   Mixed hyperlipidemia 01/24/2019   Insomnia 01/24/2019   Hyperlipidemia 01/24/2019   Essential hypertension 01/24/2019   Lumbar disc herniation 01/24/2019    Current Outpatient Medications:    acetaminophen (TYLENOL) 500 MG tablet, Take 500-1,000 mg by mouth every 6 (six) hours as needed for mild pain, moderate pain or headache., Disp:  , Rfl:    apixaban (ELIQUIS) 5 MG TABS tablet, Take 1 tablet (5 mg total) by mouth 2 (two) times daily., Disp: 60 tablet, Rfl: 0   Buprenorphine HCl-Naloxone HCl 8-2 MG FILM, Place 1 tablet under the tongue 2 (two) times daily., Disp: , Rfl:    eplerenone (INSPRA) 50 MG tablet, TAKE 2 TABLETS BY MOUTH DAILY (Patient taking differently: Take 100 mg by mouth in the morning.), Disp: 60 tablet, Rfl: 3   gabapentin (NEURONTIN) 800 MG tablet, Take 1 tablet (800 mg total) by mouth 3 (three) times daily., Disp: 90 tablet, Rfl: 1   lactulose (CHRONULAC) 10 GM/15ML solution, Take 15 mLs (10 g total) by mouth 3 (three) times daily., Disp: 946 mL, Rfl: 1   metolazone (ZAROXOLYN) 2.5 MG tablet, Take 1 tablet (2.5 mg total) by mouth 3 (three) times a week. Monday, Wednesday and Fridays (Patient taking differently: Take 2.5 mg by mouth every Monday, Wednesday, and Friday.), Disp: 14 tablet, Rfl: 0   metoprolol succinate (TOPROL XL) 25 MG 24 hr tablet, Take 1 tablet (25 mg total) by mouth 2 (two) times daily., Disp: 60 tablet, Rfl: 2   ondansetron (ZOFRAN-ODT) 4 MG disintegrating tablet, TAKE 1 TABLET BY MOUTH EVERY 8 HOURS AS NEEDED FOR NAUSEA OR VOMITING (Patient taking differently: 4 mg every 8 (eight) hours as needed for nausea or vomiting (disolve orally).), Disp: 20 tablet, Rfl: 2   pantoprazole (PROTONIX) 40 MG tablet, Take 40 mg by mouth 2 (two) times daily before a meal., Disp: , Rfl:    polyethylene glycol (MIRALAX / GLYCOLAX) 17 g packet, Take 17 g by mouth as needed for mild constipation (mix and drink as directed)., Disp: , Rfl:    potassium chloride SA (KLOR-CON M) 20 MEQ tablet, TAKE 5 TABLETS BY MOUTH TWICE DAILY. EXTRA 40 MEQ WITH METOLAZONE DAYS. (Patient taking differently: 40-100 mEq See admin instructions. Take 100 mEq by mouth in the morning and evening & an additional 40 mEq on Mon/Wed/Fri), Disp: 260 tablet, Rfl: 0   pravastatin (PRAVACHOL) 20 MG tablet, Take 1 tablet (20 mg total) by mouth  daily. (Patient taking differently: Take 20 mg by mouth at bedtime.), Disp: 30 tablet, Rfl: 1   sertraline (ZOLOFT) 50 MG tablet, Take 1 tablet (50 mg total) by mouth daily., Disp: 30 tablet, Rfl: 3   sodium chloride (OCEAN) 0.65 % SOLN nasal spray, Place 1 spray into both nostrils as needed for congestion., Disp: , Rfl:    tamsulosin (FLOMAX) 0.4 MG CAPS capsule, Take 1 capsule (0.4 mg total) by mouth daily., Disp: 30 capsule, Rfl: 0   torsemide (DEMADEX) 20 MG tablet, Take 100 mg by mouth See admin instructions. Take 100 mg by mouth in the morning and evening, Disp: , Rfl:    triamcinolone (KENALOG) 0.025 % ointment, Apply 1 application topically 2 (two) times daily. (Patient taking differently: Apply 1 application topically 2 (two) times daily as needed (as directed- to affected sites).), Disp: 30 g, Rfl: 3   VENTOLIN HFA 108 (90 Base) MCG/ACT inhaler, Inhale 1 puff into the lungs  every 4 (four) hours as needed for shortness of breath. (Patient taking differently: Inhale 2 puffs into the lungs every 4 (four) hours as needed for shortness of breath.), Disp: 6.7 g, Rfl: 1   zolpidem (AMBIEN) 10 MG tablet, Take 1 tablet (10 mg total) by mouth at bedtime as needed for sleep. (Patient taking differently: Take 10 mg by mouth at bedtime.), Disp: 30 tablet, Rfl: 0 Allergies  Allergen Reactions   Penicillins Other (See Comments)    Caused convulsions as a child  Did it involve swelling of the face/tongue/throat, SOB, or low BP? N/A Did it involve sudden or severe rash/hives, skin peeling, or any reaction on the inside of your mouth or nose? N/A Did you need to seek medical attention at a hospital or doctor's office?N/A When did it last happen? N/A If all above answers are "NO", may proceed with cephalosporin use.     Social History   Socioeconomic History   Marital status: Single    Spouse name: Not on file   Number of children: Not on file   Years of education: Not on file   Highest education  level: Not on file  Occupational History   Not on file  Tobacco Use   Smoking status: Never   Smokeless tobacco: Never  Vaping Use   Vaping Use: Never used  Substance and Sexual Activity   Alcohol use: Not Currently   Drug use: Not Currently    Types: Marijuana    Comment: Smoked for 30 years   Sexual activity: Not on file  Other Topics Concern   Not on file  Social History Narrative   Not on file   Social Determinants of Health   Financial Resource Strain: Medium Risk   Difficulty of Paying Living Expenses: Somewhat hard  Food Insecurity: No Food Insecurity   Worried About Running Out of Food in the Last Year: Never true   Ran Out of Food in the Last Year: Never true  Transportation Needs: Unmet Transportation Needs   Lack of Transportation (Medical): Yes   Lack of Transportation (Non-Medical): No  Physical Activity: Not on file  Stress: Not on file  Social Connections: Not on file  Intimate Partner Violence: Not on file    Physical Exam Vitals reviewed.  Constitutional:      Appearance: Normal appearance. He is obese.  HENT:     Head: Normocephalic.     Nose: Nose normal.     Mouth/Throat:     Mouth: Mucous membranes are moist.     Pharynx: Oropharynx is clear.  Eyes:     Conjunctiva/sclera: Conjunctivae normal.     Pupils: Pupils are equal, round, and reactive to light.  Cardiovascular:     Rate and Rhythm: Normal rate and regular rhythm.     Pulses: Normal pulses.     Heart sounds: Normal heart sounds.  Pulmonary:     Effort: Pulmonary effort is normal. No respiratory distress.     Breath sounds: Normal breath sounds. No stridor. No wheezing or rales.  Musculoskeletal:        General: Swelling and tenderness present. Normal range of motion.     Cervical back: Normal range of motion.     Right lower leg: Edema present.     Left lower leg: Edema present.     Comments: Bilateral leg swelling, redness, tenderness and dry skin.   Skin:    General: Skin is  warm and dry.     Capillary Refill: Capillary  refill takes less than 2 seconds.  Neurological:     General: No focal deficit present.     Mental Status: He is alert. Mental status is at baseline.  Psychiatric:        Mood and Affect: Mood normal.        Future Appointments  Date Time Provider [place]  04/28/2021  9:00 AM Noralyn Pick, NP [facility]  05/16/2021 11:20 AM Larey Dresser, MD [facility] None     ACTION: Home visit completed

## 2021-04-15 NOTE — Progress Notes (Signed)
°  Cardiomems Remote Monitoring  S/P Cardiomems Implant 11/14/2020   BMET 04/09/21 K 3.5 Creatinine 1.04  I have reviewed weekly and current PAD from cardiomems device. PAD reading stable.   PAD Goal 10 . Current reading is 9   Continue current diuretic regimen.   Follow weekly readings.   Renaye Janicki NP-C  4:07 PM

## 2021-04-17 ENCOUNTER — Telehealth (HOSPITAL_COMMUNITY): Payer: Self-pay

## 2021-04-17 NOTE — Telephone Encounter (Signed)
Charles Gray with [place] requesting order for [facility] for Charles T. She reports verbal order would be fine. Her number             [phone]  They are trying to get him scheduled tomorrow. Thanks so much!  -message sent to [place] also left with [facility] regarding same.

## 2021-04-18 ENCOUNTER — Telehealth (HOSPITAL_COMMUNITY): Payer: Self-pay | Admitting: Family Medicine

## 2021-04-18 NOTE — Telephone Encounter (Signed)
Called the number given 3x's not a working number

## 2021-04-18 NOTE — Telephone Encounter (Signed)
Called [name] [phone] at [facility] and gave verbal order for [place] for unna boots. They plan to place these this weekend.  Allena Katz, FNP-BC

## 2021-04-21 ENCOUNTER — Encounter (HOSPITAL_COMMUNITY): Payer: Self-pay

## 2021-04-21 NOTE — Progress Notes (Incomplete)
ADVANCED HF CLINIC NOTE  Primary Care: Enid Skeens., MD Primary Cardiologist: Dr. Berniece Salines, DO  HF Cardiology: Dr. [name]  HPI: 66 y.o. 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    He has had multiple hospitalizations in 2021 for CHF exacerbation, requiring IV Lasix and change in PO regimen from lasix to torsemide. Echo 4/21 showed normal LVEF 55-60% w/ G1DD. RV not well visualized, but felt to have component of RV failure. [place] 4/21 showed normal PA pressure and normal cardiac output. PFTs also completed and c/w mild obstructive airway disease.   In 7/21, he had a coronary CTA.  This showed no coronary calcium but there was a concern for soft plaque in the distal LCx and distal RCA with hemodynamically significant stenosis.  He was managed medically. He has not had any chest pain, but was started on ranolazine after his CTA.  He feels like it makes his "head fuzzy."    Admitted with A/C diastolic heart failure 00/9381. Diuresed with IV lasix and transitioned to bumex 3 mg twice a day. Discharge weight 360 pounds.   He was admitted again in 12/21 with CHF.  Echo in 12/21 showed EF 82-99%, grade 2 diastolic dysfunction, RV poorly visualized.   Admitted 3/71/69 with A/C diastolic heart failure. Diuresed with IV lasix + metolazone. Transitioned to torsemide 60 mg twice a day and metolazone twice a day. D/C weight 364 pounds.   Admitted 5/28//22 with cellulitis. Treated with antibiotics.   Admitted 09/20/20 with  A/C CHF. He diuresed 5 L with IV lasix, weight 371 at discharge.  He was readmitted for A/C CHF 7/22. He was diuresed with IV lasix + metolazone. RHC showed  optimized filling pressures and preserved CO/CI. He was discharged on torsemide 100 mg bid + metolazone M/W/F. Discharge weight 374 lbs.  RHC (8/22) showed normal filling pressures & he underwent Cardiomems  placement.  Admitted 9/10-9/12/22 for acute encephalopathy, felt secondary to polypharmacy. Ambien and gabapentin were briefly held. Ammonia mildly elevated and he was started on lactulose. Mentation returned to baseline and he was discharged home with Home Health PT/OT.  Last seen in for f/u 02/05/21. Appeared slightly volume up. Given extra dose of metolazone. It was felt that most of leg edema likely due to venous insufficiency. Needs better adherence with UNNA boots/compression stockings. Now has home health and paramedicine.   Saw ID for suspected cellulitis. No active infection identified. Referral to lymphedema clinic submitted.   Here today for 4 week f/u.  Paramedicine continues to follow patient in community. Also had aide that visits him daily and has weekly PT session in home. Notes some dyspnea with exertion, may be slightly worse than baseline. Leg edema improved. Using compression stockings daily. Paramedicine has been coaching him on fluid and sodium restriction. Has been adherent with all medicines except occasionally missed pravastatin and potassium.   Cardiomems: PAd 8 on 11/22 (goal 10).  ECG (personally reviewed): SR with sinus arrhythmia, 74 bpm   PMH: 1. HTN 2. OSA 3. Hyperlipidemia 4. Low back pain 5. PE/DVT in 2/21.  - V/Q scan in 6/21 was not suggestive of chronic PE.  6. Nonalcoholic steatohepatitis.  7. GI bleed in 10/20.  8. Anxiety 9. CAD: Coronary CTA in 7/21 with calcium score 0 but concern for soft plaque distal CFX and RCA.  By [facility], there was flow-limiting stenosis in distal CFX and distal RCA.  10. Chronic  diastolic CHF: Echo in 6/56 with EF 55-60%, RV poorly visualized.  - RHC (4/21): mean RA 8, PA 20/8, mean PCWP 11, CI 2.8 - Echo (12/21): EF 81-27%, grade 2 diastolic dysfunction, RV poorly visualized.   - RHC (8/22) normal filling pressures, RA mean 7, RV 29/9, PA 32/15, mean 22, PCWP mean 13, CO/CI 7.32/2.41. - Cardiomems (8/22) placement. 11.  PFTs (4/21): Mild obstruction.  12. ABIs (10/21): Normal.  Review of Systems: All systems reviewed and negative except as per HPI.   Current Outpatient Medications  Medication Sig Dispense Refill   acetaminophen (TYLENOL) 500 MG tablet Take 500-1,000 mg by mouth every 6 (six) hours as needed for mild pain, moderate pain or headache.     apixaban (ELIQUIS) 5 MG TABS tablet Take 1 tablet (5 mg total) by mouth 2 (two) times daily. 60 tablet 0   Buprenorphine HCl-Naloxone HCl 8-2 MG FILM Place 1 tablet under the tongue 2 (two) times daily.     eplerenone (INSPRA) 50 MG tablet TAKE 2 TABLETS BY MOUTH DAILY (Patient taking differently: Take 100 mg by mouth daily.) 60 tablet 3   gabapentin (NEURONTIN) 800 MG tablet Take 1 tablet (800 mg total) by mouth 3 (three) times daily. 90 tablet 1   lactulose (CHRONULAC) 10 GM/15ML solution Take 15 mLs (10 g total) by mouth 3 (three) times daily. 946 mL 1   metolazone (ZAROXOLYN) 2.5 MG tablet Take 1 tablet (2.5 mg total) by mouth 3 (three) times a week. Monday, Wednesday and Fridays (Patient taking differently: Take 2.5 mg by mouth every Monday, Wednesday, and Friday.) 14 tablet 0   metoprolol succinate (TOPROL XL) 25 MG 24 hr tablet Take 1 tablet (25 mg total) by mouth 2 (two) times daily. 60 tablet 2   ondansetron (ZOFRAN-ODT) 4 MG disintegrating tablet TAKE 1 TABLET BY MOUTH EVERY 8 HOURS AS NEEDED FOR NAUSEA OR VOMITING (Patient taking differently: 4 mg every 8 (eight) hours as needed for nausea or vomiting (disolve orally).) 20 tablet 2   pantoprazole (PROTONIX) 40 MG tablet Take 40 mg by mouth 2 (two) times daily before a meal.     polyethylene glycol (MIRALAX / GLYCOLAX) 17 g packet Take 17 g by mouth as needed for mild constipation (mix and drink as directed).     potassium chloride SA (KLOR-CON M) 20 MEQ tablet TAKE 5 TABLETS BY MOUTH TWICE DAILY. EXTRA 40 MEQ WITH METOLAZONE DAYS. (Patient taking differently: 40-100 mEq See admin instructions. Take 100  mEq by mouth in the morning and evening & an additional 40 mEq on Mon/Wed/Fri) 260 tablet 0   pravastatin (PRAVACHOL) 20 MG tablet Take 1 tablet (20 mg total) by mouth daily. (Patient taking differently: Take 20 mg by mouth at bedtime.) 30 tablet 1   sertraline (ZOLOFT) 50 MG tablet Take 1 tablet (50 mg total) by mouth daily. 30 tablet 3   sodium chloride (OCEAN) 0.65 % SOLN nasal spray Place 1 spray into both nostrils as needed for congestion.     tamsulosin (FLOMAX) 0.4 MG CAPS capsule Take 1 capsule (0.4 mg total) by mouth daily. 30 capsule 0   torsemide (DEMADEX) 20 MG tablet Take 100 mg by mouth See admin instructions. Take 100 mg by mouth in the morning and evening     triamcinolone (KENALOG) 0.025 % ointment Apply 1 application topically 2 (two) times daily. (Patient taking differently: Apply 1 application topically 2 (two) times daily as needed (as directed- to affected sites).) 30 g 3   VENTOLIN  HFA 108 (90 Base) MCG/ACT inhaler Inhale 1 puff into the lungs every 4 (four) hours as needed for shortness of breath. (Patient taking differently: Inhale 2 puffs into the lungs every 4 (four) hours as needed for shortness of breath.) 6.7 g 1   zolpidem (AMBIEN) 10 MG tablet Take 1 tablet (10 mg total) by mouth at bedtime as needed for sleep. (Patient taking differently: Take 10 mg by mouth at bedtime.) 30 tablet 0   No current facility-administered medications for this visit.   Allergies  Allergen Reactions   Penicillins Other (See Comments)    Caused convulsions as a child  Did it involve swelling of the face/tongue/throat, SOB, or low BP? N/A Did it involve sudden or severe rash/hives, skin peeling, or any reaction on the inside of your mouth or nose? N/A Did you need to seek medical attention at a hospital or doctor's office?N/A When did it last happen? N/A If all above answers are "NO", may proceed with cephalosporin use.   Social History   Socioeconomic History   Marital status: Single     Spouse name: Not on file   Number of children: Not on file   Years of education: Not on file   Highest education level: Not on file  Occupational History   Not on file  Tobacco Use   Smoking status: Never   Smokeless tobacco: Never  Vaping Use   Vaping Use: Never used  Substance and Sexual Activity   Alcohol use: Not Currently   Drug use: Not Currently    Types: Marijuana    Comment: Smoked for 30 years   Sexual activity: Not on file  Other Topics Concern   Not on file  Social History Narrative   Not on file   Social Determinants of Health   Financial Resource Strain: Medium Risk   Difficulty of Paying Living Expenses: Somewhat hard  Food Insecurity: No Food Insecurity   Worried About Running Out of Food in the Last Year: Never true   Ran Out of Food in the Last Year: Never true  Transportation Needs: Unmet Transportation Needs   Lack of Transportation (Medical): Yes   Lack of Transportation (Non-Medical): No  Physical Activity: Not on file  Stress: Not on file  Social Connections: Not on file  Intimate Partner Violence: Not on file    Family History  Problem Relation Age of Onset   Hyperlipidemia Mother    Hypertension Mother    Stroke Mother    CAD Maternal Grandmother    CAD Maternal Grandfather    There were no vitals taken for this visit.  Wt Readings from Last 3 Encounters:  04/15/21 (!) 173.7 kg (383 lb)  04/09/21 (!) 177.5 kg (391 lb 4.8 oz)  04/01/21 (!) 178.3 kg (393 lb)   PHYSICAL EXAM: General:  Well appearing. No resp `difficulty HEENT: normal Neck: JVP difficult to assess. Carotids 2+ bilat; no bruits. No lymphadenopathy or thryomegaly appreciated. Cor: PMI nondisplaced. Regular rate & rhythm. No rubs, gallops or murmurs. Lungs: clear Abdomen: soft, nontender, nondistended. No hepatosplenomegaly. No bruits or masses. Good bowel sounds. Extremities: no cyanosis, clubbing, rash, 1+ edema, chronic venous stasis changes noted Neuro: alert &  orientedx3, cranial nerves grossly intact. moves all 4 extremities w/o difficulty. Affect flat   ASSESSMENT & PLAN: 1. Chronic diastolic CHF with suspected RV dysfunction: Echo 7/22 with EF 60-65%, mild LVH, normal RV, unable to estimate PA systolic pressure. Multiple CHF admissions despite aggressive outpatient diuretic regimen and paramedicine.  RHC (7/22) with optimized filling pressures, PCWP 13, CO/CI 7.3/2.4. Received IV lasix 80 mg through paramedicine 11/12/20. RHC (8/22) with normal filling pressures; underwent placement of Cardiomems.  - NYHA III at baseline. Volume looks good today. Venous stasis contributing to leg edema. Now reports wearing compression stockings daily. Edema significantly improved from prior visits - Doing better with fluid and sodium intake. Paramedicine has been coaching him. Appreciate assistance. - Continue torsemide 100 mg bid + metolazone 2.5 mg on M/W/F + extra 40 KCl on metolazone days.  - Continue eplerenone 100 mg daily.  - Off Jardiance (GI upset). - BMET today 2. H/o PE/DVT: Suspect due to sedentary lifestyle.  Occurred in 2/21.  V/Q scan in 6/21 did not show evidence for chronic PE.   - Continue apixaban. No bleeding issues.  3. OSA: Cannot tolerate CPAP. 4. Anxiety/depression: This is a major issue.   - Per PCP 5. CAD: Cath 1/22 with no significant CAD. No CP. 6. Hypertension: BP at goal - Continue Toprol XL 25 mg bid.   7. Atrial fibrillation: Paroxysmal. SR on ECG today - Continue apixaban. No bleeding issues. - Continue Toprol XL. 8. Lower extremity cellulitis/stasis dermatitis: Admitted 5/22 with possible BLE cellulitis. Legs are more edematous today with erythema and new blisters. He has been on multiple rounds of Keflex, Levaquin and doxycycline over the last several months.  - Seen by ID 10/22, lower extremity swelling and redness felt to be more likely d/t stasis dermatitis rather than cellulitis. No evidence of infection.  Follow-up: Dr.  [name] as scheduled on 05/16/20  Maricela Bo [facility] PA-C 04/21/2021

## 2021-04-22 ENCOUNTER — Other Ambulatory Visit (HOSPITAL_COMMUNITY): Payer: Self-pay

## 2021-04-22 ENCOUNTER — Telehealth (HOSPITAL_COMMUNITY): Payer: Self-pay

## 2021-04-22 NOTE — Telephone Encounter (Signed)
Mr. Charles Gray called me requesting to cancel tomorrow's scheduled appointment in the HF clinic. He reports he is too weak to make the trip. He stated he does not want to be rescheduled he will follow up in [place] for scheduled visit at that time. I saw him in home today, home visit note will reflect same. Call complete. I will forward this to HF staff.

## 2021-04-22 NOTE — Progress Notes (Signed)
Paramedicine Encounter    Patient ID: Charles Gray, male    DOB: 07/22/1955, 65 y.o.   MRN: [id number]   Arrived for home visit for [place] where he was seated in his recliner alert and oriented reporting to be feeling "okay but not great". He states over the weekend he was short of breath, dizzy and tired. He messaged HF clinic and they arranged an in person visit for him for tomorrow however, he reports he does not want to go. When asked why, he states he is too weak and wants to wait until his appointment for Feb. I advised him it would be in his best interest to keep the appointment and he continued to decide against it. I messaged scheduler and canceled same and canceled his ride with [facility].   I obtained assessment and vitals. Vitals all within normal range. Weight is same as last week. Legs are wrapped in unna boots so unable to fully assess same. He complains of lower back pain and leg weakness. He is supposed to start PT today. Lung sounds clear. No abdominal distention noted. He did appear weak upon walking from living room to bathroom with some shortness of breath. He does not exercise, he sits in his recliner most of the day and will walk from there to bathroom and or kitchen areas. I encouraged him to walk short distances for short periods to start regaining his strength. He verbalized understanding.   I also informed Charles Gray to be sure to submit CardioMems readings daily. He agreed with plan.   Medications were reviewed and pill box filled accordingly. Charles Gray has been compliant with medications over the last week.   I noticed several snack foods on the floor around his recliner including cheetos, crackers, and beef jerky. I asked him if he had been eating same and he said, "No not really". I stressed the importance of a heart healthy diet to Charles Gray, he verbalized understanding.   We reviewed upcoming appointments. He will have home RN come out for leg wraps Mondays and  Thursdays.    I plan to see Charles Gray in one week in the home.   Refills called into [facility]: Inspra Gabapentin Torsemide    Patient Care Team: Enid Skeens., MD as PCP - General (Family Medicine) Berniece Salines, DO as PCP - Cardiology (Cardiology) Jorge Ny, LCSW as Social Worker (Licensed Clinical Social Worker)  Patient Active Problem List   Diagnosis Date Noted   Melena    Gastric polyps    GI bleed 04/06/2021   Acute encephalopathy 12/21/2020   COPD (chronic obstructive pulmonary disease) ([place]) 12/21/2020   Chronic diastolic (congestive) heart failure ([place]) 11/14/2020   Syncope 10/28/2020   CHF (congestive heart failure) ([place]) 10/28/2020   Chronic venous stasis dermatitis of both lower extremities 08/27/2020   Cellulitis of both lower extremities 08/14/2020   Microcytic anemia 08/14/2020   GERD without esophagitis 06/10/2020   Atrial fibrillation with rapid ventricular response ([place]) 03/24/2020   Acute on chronic right heart failure ([place]) 01/27/2020   Acute respiratory failure with hypoxia (HCC)    Fatty liver disease, nonalcoholic    Shock ([place])    Personal history of DVT (deep vein thrombosis) 12/26/2019   Chronic anticoagulation 12/26/2019   Palpitations 12/26/2019   AF (paroxysmal atrial fibrillation) ([place]) 12/23/2019   Heart failure, systolic, acute ([place]) 23/30/0762   Acute on chronic congestive heart failure (HCC)    Chronic diastolic HF (heart failure) ([place]) 08/23/2019   Chest  pain 08/23/2019   Lactic acidosis 08/23/2019   History of pulmonary embolism 08/12/2019   Atrial tachycardia ([place]) 08/12/2019   Occasional tremors 08/12/2019   OSA (obstructive sleep apnea) 08/12/2019   Acute on chronic right-sided heart failure ([place]) 08/06/2019   Pulmonary embolism ([place]) 06/08/2019   Normocytic anemia 06/08/2019   Pulmonary emboli ([place]) 06/08/2019   Chronic heart failure with preserved ejection fraction ([place]) 05/30/2019   Class 2 severe obesity due to  excess calories with serious comorbidity and body mass index (BMI) of 39.0 to 39.9 in adult ([place]) 05/30/2019   Morbid obesity with BMI of 40.0-44.9, adult ([place]) 05/30/2019   Hyponatremia    Drug induced constipation    Peripheral edema    Hypotension due to drugs    Hypoalbuminemia due to protein-calorie malnutrition (HCC)    Hypokalemia    Thrombocytopenia (HCC)    Anemia of chronic disease    Cirrhosis of liver without ascites (HCC)    Chronic pain syndrome    Debility 03/13/2019   Portal hypertensive gastropathy ([place]) 03/06/2019   Dyspnea    GIB (gastrointestinal bleeding) 03/04/2019   Mixed hyperlipidemia 01/24/2019   Insomnia 01/24/2019   Hyperlipidemia 01/24/2019   Essential hypertension 01/24/2019   Lumbar disc herniation 01/24/2019    Current Outpatient Medications:    acetaminophen (TYLENOL) 500 MG tablet, Take 500-1,000 mg by mouth every 6 (six) hours as needed for mild pain, moderate pain or headache., Disp: , Rfl:    apixaban (ELIQUIS) 5 MG TABS tablet, Take 1 tablet (5 mg total) by mouth 2 (two) times daily., Disp: 60 tablet, Rfl: 0   Buprenorphine HCl-Naloxone HCl 8-2 MG FILM, Place 1 tablet under the tongue 2 (two) times daily., Disp: , Rfl:    eplerenone (INSPRA) 50 MG tablet, TAKE 2 TABLETS BY MOUTH DAILY (Patient taking differently: Take 100 mg by mouth daily.), Disp: 60 tablet, Rfl: 3   gabapentin (NEURONTIN) 800 MG tablet, Take 1 tablet (800 mg total) by mouth 3 (three) times daily., Disp: 90 tablet, Rfl: 1   lactulose (CHRONULAC) 10 GM/15ML solution, Take 15 mLs (10 g total) by mouth 3 (three) times daily., Disp: 946 mL, Rfl: 1   metolazone (ZAROXOLYN) 2.5 MG tablet, Take 1 tablet (2.5 mg total) by mouth 3 (three) times a week. Monday, Wednesday and Fridays (Patient taking differently: Take 2.5 mg by mouth every Monday, Wednesday, and Friday.), Disp: 14 tablet, Rfl: 0   metoprolol succinate (TOPROL XL) 25 MG 24 hr tablet, Take 1 tablet (25 mg total) by mouth 2  (two) times daily., Disp: 60 tablet, Rfl: 2   ondansetron (ZOFRAN-ODT) 4 MG disintegrating tablet, TAKE 1 TABLET BY MOUTH EVERY 8 HOURS AS NEEDED FOR NAUSEA OR VOMITING (Patient taking differently: 4 mg every 8 (eight) hours as needed for nausea or vomiting (disolve orally).), Disp: 20 tablet, Rfl: 2   pantoprazole (PROTONIX) 40 MG tablet, Take 40 mg by mouth 2 (two) times daily before a meal., Disp: , Rfl:    polyethylene glycol (MIRALAX / GLYCOLAX) 17 g packet, Take 17 g by mouth as needed for mild constipation (mix and drink as directed)., Disp: , Rfl:    potassium chloride SA (KLOR-CON M) 20 MEQ tablet, TAKE 5 TABLETS BY MOUTH TWICE DAILY. EXTRA 40 MEQ WITH METOLAZONE DAYS. (Patient taking differently: 40-100 mEq See admin instructions. Take 100 mEq by mouth in the morning and evening & an additional 40 mEq on Mon/Wed/Fri), Disp: 260 tablet, Rfl: 0   pravastatin (PRAVACHOL) 20 MG tablet,  Take 1 tablet (20 mg total) by mouth daily. (Patient taking differently: Take 20 mg by mouth at bedtime.), Disp: 30 tablet, Rfl: 1   sertraline (ZOLOFT) 50 MG tablet, Take 1 tablet (50 mg total) by mouth daily., Disp: 30 tablet, Rfl: 3   sodium chloride (OCEAN) 0.65 % SOLN nasal spray, Place 1 spray into both nostrils as needed for congestion., Disp: , Rfl:    tamsulosin (FLOMAX) 0.4 MG CAPS capsule, Take 1 capsule (0.4 mg total) by mouth daily., Disp: 30 capsule, Rfl: 0   torsemide (DEMADEX) 20 MG tablet, Take 100 mg by mouth See admin instructions. Take 100 mg by mouth in the morning and evening, Disp: , Rfl:    triamcinolone (KENALOG) 0.025 % ointment, Apply 1 application topically 2 (two) times daily. (Patient taking differently: Apply 1 application topically 2 (two) times daily as needed (as directed- to affected sites).), Disp: 30 g, Rfl: 3   VENTOLIN HFA 108 (90 Base) MCG/ACT inhaler, Inhale 1 puff into the lungs every 4 (four) hours as needed for shortness of breath. (Patient taking differently: Inhale 2 puffs  into the lungs every 4 (four) hours as needed for shortness of breath.), Disp: 6.7 g, Rfl: 1   zolpidem (AMBIEN) 10 MG tablet, Take 1 tablet (10 mg total) by mouth at bedtime as needed for sleep. (Patient taking differently: Take 10 mg by mouth at bedtime.), Disp: 30 tablet, Rfl: 0 Allergies  Allergen Reactions   Penicillins Other (See Comments)    Caused convulsions as a child  Did it involve swelling of the face/tongue/throat, SOB, or low BP? N/A Did it involve sudden or severe rash/hives, skin peeling, or any reaction on the inside of your mouth or nose? N/A Did you need to seek medical attention at a hospital or doctor's office?N/A When did it last happen? N/A If all above answers are "NO", may proceed with cephalosporin use.     Social History   Socioeconomic History   Marital status: Single    Spouse name: Not on file   Number of children: Not on file   Years of education: Not on file   Highest education level: Not on file  Occupational History   Not on file  Tobacco Use   Smoking status: Never   Smokeless tobacco: Never  Vaping Use   Vaping Use: Never used  Substance and Sexual Activity   Alcohol use: Not Currently   Drug use: Not Currently    Types: Marijuana    Comment: Smoked for 30 years   Sexual activity: Not on file  Other Topics Concern   Not on file  Social History Narrative   Not on file   Social Determinants of Health   Financial Resource Strain: Medium Risk   Difficulty of Paying Living Expenses: Somewhat hard  Food Insecurity: No Food Insecurity   Worried About Running Out of Food in the Last Year: Never true   Ran Out of Food in the Last Year: Never true  Transportation Needs: Unmet Transportation Needs   Lack of Transportation (Medical): Yes   Lack of Transportation (Non-Medical): No  Physical Activity: Not on file  Stress: Not on file  Social Connections: Not on file  Intimate Partner Violence: Not on file    Physical Exam Vitals  reviewed.  Constitutional:      Appearance: Normal appearance. He is normal weight.  HENT:     Head: Normocephalic.     Nose: Nose normal.     Mouth/Throat:  Mouth: Mucous membranes are moist.     Pharynx: Oropharynx is clear.  Eyes:     Conjunctiva/sclera: Conjunctivae normal.     Pupils: Pupils are equal, round, and reactive to light.  Cardiovascular:     Rate and Rhythm: Normal rate and regular rhythm.     Pulses: Normal pulses.     Heart sounds: Normal heart sounds.  Pulmonary:     Effort: Pulmonary effort is normal. No respiratory distress.     Breath sounds: Normal breath sounds. No stridor. No wheezing, rhonchi or rales.  Abdominal:     Palpations: Abdomen is soft.  Musculoskeletal:        General: Normal range of motion.     Cervical back: Normal range of motion.     Comments: Both legs wrapped with unna boots.  Skin:    General: Skin is warm and dry.     Capillary Refill: Capillary refill takes less than 2 seconds.  Neurological:     General: No focal deficit present.     Mental Status: He is alert. Mental status is at baseline.  Psychiatric:        Mood and Affect: Mood normal.        Future Appointments  Date Time Provider [place]  04/23/2021  1:30 PM [facility] PA/NP [facility] None  04/28/2021  9:00 AM Noralyn Pick, NP [facility]  05/16/2021 11:20 AM Larey Dresser, MD [facility] None     ACTION: Home visit completed

## 2021-04-23 ENCOUNTER — Encounter (HOSPITAL_COMMUNITY): Payer: Medicare HMO

## 2021-04-28 ENCOUNTER — Telehealth (HOSPITAL_COMMUNITY): Payer: Self-pay

## 2021-04-28 ENCOUNTER — Ambulatory Visit: Payer: Medicare HMO | Admitting: Nurse Practitioner

## 2021-04-28 NOTE — Telephone Encounter (Signed)
Ride for tomorrows appointment has been canceled as I was informed it is a tele-visit.

## 2021-04-28 NOTE — Telephone Encounter (Signed)
Ride Scheduled! Roundtrip First Leg Ride ID:  [id number]  Transportation Type:  STANDARD VEHICLE: DOOR-TO-DOOR  Pickup Date/Time:  04/29/21 at 9:15 AM (EST)  [facility] Address  [address] [place]  Drop-off Address  [place], [place]  Return Leg Ride ID:  [id number]  [facility] Date/Time:  Will Call  [facility]  [place], [place] [address], [place] Drop-off Address  [address] [place], [place] [address], [place]

## 2021-04-29 ENCOUNTER — Telehealth (INDEPENDENT_AMBULATORY_CARE_PROVIDER_SITE_OTHER): Payer: Medicare HMO | Admitting: Gastroenterology

## 2021-04-29 ENCOUNTER — Other Ambulatory Visit: Payer: Self-pay | Admitting: Internal Medicine

## 2021-04-29 ENCOUNTER — Other Ambulatory Visit (HOSPITAL_COMMUNITY): Payer: Self-pay

## 2021-04-29 DIAGNOSIS — K922 Gastrointestinal hemorrhage, unspecified: Secondary | ICD-10-CM

## 2021-04-29 NOTE — Progress Notes (Signed)
Patient's appointment was canceled after speaking with the patient on the phone.  We spent an extensive amount of time trying to connect via video visit.  Patient had this appointment for hospital follow-up of upper GI bleed and cirrhosis.  He reports to me that he is not feeling well at all and is very weak.  Has not followed up with his PCP yet either or has not had any follow-up labs.  He tells me that he has a nurse aide who does not his appointments and medications.  He was advised that if he is feeling that terribly he needs to seek more urgent evaluation in the emergency department.  He does tell me that he has had no black or bloody stools, however.  Due to the complexity of his history I think that he would be served much better by an in person office visit.  We attempted to schedule him for a visit while on the phone with him today, but he was not sure about timing with other appointments, etc so he agreed to have his nurse aide call to make an appointment for him when she arrives at his home later this afternoon as she is the one who does all of his appointments, etc.

## 2021-04-29 NOTE — Progress Notes (Signed)
Paramedicine Encounter    Patient ID: Charles Gray, male    DOB: [birth year], 66 y.o.   MRN: [id number]   Arrived for home visit for Keinan where he was seated in his recliner alert and oriented. He reports having lower leg pain and weakness. He states he can barely walk to get to the bathroom using his walker. Mikai's weight today is up 8 lbs from last week. He missed his metolazone yesterday so he stated he took it early this morning. Danton reports the nurse and physical therapist should be out this week some time. He is scheduled to speak to GI today for a follow up.   Vitals obtained. Weight 8lbs up from last week. Legs swollen and red but no more than usual. He states he is short of breath but no more than normal. Lungs clear. Abdomen distended. He reports issues with constipation. He has not been using Miralax like instructed.   I reviewed medications and filled pill box accordingly.   I explained to Kazuma at length the importance of med compliance and diet compliance. He verbalized understanding. Currently he is getting MOMS MEALS from his insurance and they are labeled HEART HEALTHY. I stressed to him the importance of limiting fluid intake and salty snacks. He verbalized understanding.   We reviewed appointments and confirmed same.   Refills called into [facility]: -Torsemide -Potassium -Pantoprazole -Metolazone  Dujuan expressed feeling down and depressed. I offered up discussion and options for speaking to therapist and he declined stating, "talking won't help". He denied feeling suicidal or homicidal.   Home visit complete. I will plan to see Keeshawn in one week.   Patient Care Team: Enid Skeens., MD as PCP - General (Family Medicine) Berniece Salines, DO as PCP - Cardiology (Cardiology) Jorge Ny, LCSW as Social Worker (Licensed Clinical Social Worker)  Patient Active Problem List   Diagnosis Date Noted   Melena    Gastric polyps    GI bleed 04/06/2021   Acute  encephalopathy 12/21/2020   COPD (chronic obstructive pulmonary disease) ([place]) 12/21/2020   Chronic diastolic (congestive) heart failure ([place]) 11/14/2020   Syncope 10/28/2020   CHF (congestive heart failure) ([place]) 10/28/2020   Chronic venous stasis dermatitis of both lower extremities 08/27/2020   Cellulitis of both lower extremities 08/14/2020   Microcytic anemia 08/14/2020   GERD without esophagitis 06/10/2020   Atrial fibrillation with rapid ventricular response ([place]) 03/24/2020   Acute on chronic right heart failure ([place]) 01/27/2020   Acute respiratory failure with hypoxia (HCC)    Fatty liver disease, nonalcoholic    Shock ([place])    Personal history of DVT (deep vein thrombosis) 12/26/2019   Chronic anticoagulation 12/26/2019   Palpitations 12/26/2019   AF (paroxysmal atrial fibrillation) ([place]) 12/23/2019   Heart failure, systolic, acute ([place]) 14/78/2956   Acute on chronic congestive heart failure (HCC)    Chronic diastolic HF (heart failure) ([place]) 08/23/2019   Chest pain 08/23/2019   Lactic acidosis 08/23/2019   History of pulmonary embolism 08/12/2019   Atrial tachycardia ([place]) 08/12/2019   Occasional tremors 08/12/2019   OSA (obstructive sleep apnea) 08/12/2019   Acute on chronic right-sided heart failure ([place]) 08/06/2019   Pulmonary embolism ([place]) 06/08/2019   Normocytic anemia 06/08/2019   Pulmonary emboli ([place]) 06/08/2019   Chronic heart failure with preserved ejection fraction ([place]) 05/30/2019   Class 2 severe obesity due to excess calories with serious comorbidity and body mass index (BMI) of 39.0 to 39.9 in adult [facility]) 05/30/2019  Morbid obesity with BMI of 40.0-44.9, adult ([place]) 05/30/2019   Hyponatremia    Drug induced constipation    Peripheral edema    Hypotension due to drugs    Hypoalbuminemia due to protein-calorie malnutrition (HCC)    Hypokalemia    Thrombocytopenia (HCC)    Anemia of chronic disease    Cirrhosis of liver without ascites (HCC)     Chronic pain syndrome    Debility 03/13/2019   Portal hypertensive gastropathy ([place]) 03/06/2019   Dyspnea    GIB (gastrointestinal bleeding) 03/04/2019   Mixed hyperlipidemia 01/24/2019   Insomnia 01/24/2019   Hyperlipidemia 01/24/2019   Essential hypertension 01/24/2019   Lumbar disc herniation 01/24/2019    Current Outpatient Medications:    acetaminophen (TYLENOL) 500 MG tablet, Take 500-1,000 mg by mouth every 6 (six) hours as needed for mild pain, moderate pain or headache., Disp: , Rfl:    apixaban (ELIQUIS) 5 MG TABS tablet, Take 1 tablet (5 mg total) by mouth 2 (two) times daily., Disp: 60 tablet, Rfl: 0   Buprenorphine HCl-Naloxone HCl 8-2 MG FILM, Place 1 tablet under the tongue 2 (two) times daily., Disp: , Rfl:    eplerenone (INSPRA) 50 MG tablet, TAKE 2 TABLETS BY MOUTH DAILY (Patient taking differently: Take 100 mg by mouth daily.), Disp: 60 tablet, Rfl: 3   gabapentin (NEURONTIN) 800 MG tablet, Take 1 tablet (800 mg total) by mouth 3 (three) times daily., Disp: 90 tablet, Rfl: 1   lactulose (CHRONULAC) 10 GM/15ML solution, Take 15 mLs (10 g total) by mouth 3 (three) times daily., Disp: 946 mL, Rfl: 1   metolazone (ZAROXOLYN) 2.5 MG tablet, Take 1 tablet (2.5 mg total) by mouth 3 (three) times a week. Monday, Wednesday and Fridays (Patient taking differently: Take 2.5 mg by mouth every Monday, Wednesday, and Friday.), Disp: 14 tablet, Rfl: 0   metoprolol succinate (TOPROL XL) 25 MG 24 hr tablet, Take 1 tablet (25 mg total) by mouth 2 (two) times daily., Disp: 60 tablet, Rfl: 2   ondansetron (ZOFRAN-ODT) 4 MG disintegrating tablet, TAKE 1 TABLET BY MOUTH EVERY 8 HOURS AS NEEDED FOR NAUSEA OR VOMITING (Patient taking differently: 4 mg every 8 (eight) hours as needed for nausea or vomiting (disolve orally).), Disp: 20 tablet, Rfl: 2   pantoprazole (PROTONIX) 40 MG tablet, Take 40 mg by mouth 2 (two) times daily before a meal., Disp: , Rfl:    polyethylene glycol (MIRALAX / GLYCOLAX)  17 g packet, Take 17 g by mouth as needed for mild constipation (mix and drink as directed)., Disp: , Rfl:    potassium chloride SA (KLOR-CON M) 20 MEQ tablet, TAKE 5 TABLETS BY MOUTH TWICE DAILY. EXTRA 40 MEQ WITH METOLAZONE DAYS. (Patient taking differently: 40-100 mEq See admin instructions. Take 100 mEq by mouth in the morning and evening & an additional 40 mEq on Mon/Wed/Fri), Disp: 260 tablet, Rfl: 0   pravastatin (PRAVACHOL) 20 MG tablet, Take 1 tablet (20 mg total) by mouth daily. (Patient taking differently: Take 20 mg by mouth at bedtime.), Disp: 30 tablet, Rfl: 1   sertraline (ZOLOFT) 50 MG tablet, Take 1 tablet (50 mg total) by mouth daily., Disp: 30 tablet, Rfl: 3   sodium chloride (OCEAN) 0.65 % SOLN nasal spray, Place 1 spray into both nostrils as needed for congestion., Disp: , Rfl:    tamsulosin (FLOMAX) 0.4 MG CAPS capsule, Take 1 capsule (0.4 mg total) by mouth daily., Disp: 30 capsule, Rfl: 0   torsemide (DEMADEX) 20 MG tablet,  Take 100 mg by mouth See admin instructions. Take 100 mg by mouth in the morning and evening, Disp: , Rfl:    triamcinolone (KENALOG) 0.025 % ointment, Apply 1 application topically 2 (two) times daily. (Patient taking differently: Apply 1 application topically 2 (two) times daily as needed (as directed- to affected sites).), Disp: 30 g, Rfl: 3   VENTOLIN HFA 108 (90 Base) MCG/ACT inhaler, Inhale 1 puff into the lungs every 4 (four) hours as needed for shortness of breath. (Patient taking differently: Inhale 2 puffs into the lungs every 4 (four) hours as needed for shortness of breath.), Disp: 6.7 g, Rfl: 1   zolpidem (AMBIEN) 10 MG tablet, Take 1 tablet (10 mg total) by mouth at bedtime as needed for sleep. (Patient taking differently: Take 10 mg by mouth at bedtime.), Disp: 30 tablet, Rfl: 0 Allergies  Allergen Reactions   Penicillins Other (See Comments)    Caused convulsions as a child  Did it involve swelling of the face/tongue/throat, SOB, or low BP?  N/A Did it involve sudden or severe rash/hives, skin peeling, or any reaction on the inside of your mouth or nose? N/A Did you need to seek medical attention at a hospital or doctor's office?N/A When did it last happen? N/A If all above answers are "NO", may proceed with cephalosporin use.     Social History   Socioeconomic History   Marital status: Single    Spouse name: Not on file   Number of children: Not on file   Years of education: Not on file   Highest education level: Not on file  Occupational History   Not on file  Tobacco Use   Smoking status: Never   Smokeless tobacco: Never  Vaping Use   Vaping Use: Never used  Substance and Sexual Activity   Alcohol use: Not Currently   Drug use: Not Currently    Types: Marijuana    Comment: Smoked for 30 years   Sexual activity: Not on file  Other Topics Concern   Not on file  Social History Narrative   Not on file   Social Determinants of Health   Financial Resource Strain: Medium Risk   Difficulty of Paying Living Expenses: Somewhat hard  Food Insecurity: No Food Insecurity   Worried About Running Out of Food in the Last Year: Never true   Ran Out of Food in the Last Year: Never true  Transportation Needs: Unmet Transportation Needs   Lack of Transportation (Medical): Yes   Lack of Transportation (Non-Medical): No  Physical Activity: Not on file  Stress: Not on file  Social Connections: Not on file  Intimate Partner Violence: Not on file    Physical Exam Vitals reviewed.  Constitutional:      Appearance: Normal appearance. He is obese.  HENT:     Head: Normocephalic.     Nose: Nose normal.     Mouth/Throat:     Mouth: Mucous membranes are moist.     Pharynx: Oropharynx is clear.  Eyes:     Pupils: Pupils are equal, round, and reactive to light.  Cardiovascular:     Rate and Rhythm: Normal rate and regular rhythm.  Pulmonary:     Effort: Pulmonary effort is normal.     Breath sounds: Normal breath  sounds.  Abdominal:     General: There is distension.  Musculoskeletal:        General: Swelling present.     Right lower leg: Edema present.  Left lower leg: Edema present.     Comments: Patient reports leg weakness   Skin:    General: Skin is warm and dry.     Capillary Refill: Capillary refill takes less than 2 seconds.  Neurological:     General: No focal deficit present.     Mental Status: He is alert. Mental status is at baseline.  Psychiatric:        Mood and Affect: Mood normal.        Future Appointments  Date Time Provider [place]  04/29/2021 10:30 AM Zehr, Laban Emperor, PA-C [facility]  05/16/2021 11:20 AM Larey Dresser, MD [facility] None     ACTION: Home visit completed

## 2021-04-30 ENCOUNTER — Telehealth (HOSPITAL_COMMUNITY): Payer: Self-pay | Admitting: Licensed Clinical Social Worker

## 2021-04-30 NOTE — Telephone Encounter (Signed)
HF Paramedicine Team Based Care Meeting  HF MD- NA  HF NP - [place] NP-C   [facility] admit within the last 30 days for heart failure? Not heart failure related- GI bleed  Medications concerns? Not able to manage own meds- will look into pill packs if he gets stable   Education needs? Continued dietary education  Eligible for discharge? Unmanaged heart failure    Jorge Ny, LCSW Clinical Social Worker [facility] Desk#: [phone] Cell#: [phone]

## 2021-05-06 ENCOUNTER — Other Ambulatory Visit (HOSPITAL_COMMUNITY): Payer: Self-pay

## 2021-05-06 NOTE — Progress Notes (Signed)
Paramedicine Encounter    Patient ID: Charles Gray, male    DOB: 06/16/1955, 66 y.o.   MRN: [id number]  Arrived for home visit for [name] who reports feeling okay today. Home health came out yesterday to wrap his legs. He reports he has been eating poorly over the last week.   His weight is up 3 lbs.   I reminded him of dietary advice and he verbalized understanding.  He missed his Metolazone yesterday, so he is taking same today with his extra K.   I obtained vitals and assessment.  Lungs clear, no JVD or abdominal distention.   I verified meds, he has a letter from [facility] stating he needs a prior auth for Inspra, I will relay this to pharmacy clinic.   Pill box filled for one week.   Appointments reviewed and rearranged.   We discussed mental health and he continues to refuse wanting to talk to a therapist.   He asked for an appointment with [facility] I advised him they are referral based only and he would have to request this from his PCP.   Home visit complete. I will see [name] in one week.    Patient Care Team: Enid Skeens., MD as PCP - General (Family Medicine) Berniece Salines, DO as PCP - Cardiology (Cardiology) Jorge Ny, LCSW as Social Worker (Licensed Clinical Social Worker)  Patient Active Problem List   Diagnosis Date Noted   Melena    Gastric polyps    UGIB (upper gastrointestinal bleed) 04/06/2021   Acute encephalopathy 12/21/2020   COPD (chronic obstructive pulmonary disease) ([place]) 12/21/2020   Chronic diastolic (congestive) heart failure ([place]) 11/14/2020   Syncope 10/28/2020   CHF (congestive heart failure) ([place]) 10/28/2020   Chronic venous stasis dermatitis of both lower extremities 08/27/2020   Cellulitis of both lower extremities 08/14/2020   Microcytic anemia 08/14/2020   GERD without esophagitis 06/10/2020   Atrial fibrillation with rapid ventricular response ([place]) 03/24/2020   Acute on chronic right heart failure ([place]) 01/27/2020    Acute respiratory failure with hypoxia ([place])    Fatty liver disease, nonalcoholic    Shock ([place])    Personal history of DVT (deep vein thrombosis) 12/26/2019   Chronic anticoagulation 12/26/2019   Palpitations 12/26/2019   AF (paroxysmal atrial fibrillation) ([place]) 12/23/2019   Heart failure, systolic, acute ([place]) 38/93/7342   Acute on chronic congestive heart failure (HCC)    Chronic diastolic HF (heart failure) ([place]) 08/23/2019   Chest pain 08/23/2019   Lactic acidosis 08/23/2019   History of pulmonary embolism 08/12/2019   Atrial tachycardia ([place]) 08/12/2019   Occasional tremors 08/12/2019   OSA (obstructive sleep apnea) 08/12/2019   Acute on chronic right-sided heart failure ([place]) 08/06/2019   Pulmonary embolism ([place]) 06/08/2019   Normocytic anemia 06/08/2019   Pulmonary emboli ([place]) 06/08/2019   Chronic heart failure with preserved ejection fraction ([place]) 05/30/2019   Class 2 severe obesity due to excess calories with serious comorbidity and body mass index (BMI) of 39.0 to 39.9 in adult ([place]) 05/30/2019   Morbid obesity with BMI of 40.0-44.9, adult ([place]) 05/30/2019   Hyponatremia    Drug induced constipation    Peripheral edema    Hypotension due to drugs    Hypoalbuminemia due to protein-calorie malnutrition (HCC)    Hypokalemia    Thrombocytopenia (HCC)    Anemia of chronic disease    Cirrhosis of liver without ascites ([place])    Chronic pain syndrome    Debility 03/13/2019  Portal hypertensive gastropathy ([place]) 03/06/2019   Dyspnea    GIB (gastrointestinal bleeding) 03/04/2019   Mixed hyperlipidemia 01/24/2019   Insomnia 01/24/2019   Hyperlipidemia 01/24/2019   Essential hypertension 01/24/2019   Lumbar disc herniation 01/24/2019    Current Outpatient Medications:    acetaminophen (TYLENOL) 500 MG tablet, Take 500-1,000 mg by mouth every 6 (six) hours as needed for mild pain, moderate pain or headache., Disp: , Rfl:    apixaban (ELIQUIS) 5 MG TABS tablet, Take 1  tablet (5 mg total) by mouth 2 (two) times daily., Disp: 60 tablet, Rfl: 0   Buprenorphine HCl-Naloxone HCl 8-2 MG FILM, Place 1 tablet under the tongue 2 (two) times daily., Disp: , Rfl:    eplerenone (INSPRA) 50 MG tablet, TAKE 2 TABLETS BY MOUTH DAILY (Patient taking differently: Take 100 mg by mouth daily.), Disp: 60 tablet, Rfl: 3   gabapentin (NEURONTIN) 800 MG tablet, Take 1 tablet (800 mg total) by mouth 3 (three) times daily., Disp: 90 tablet, Rfl: 1   lactulose (CHRONULAC) 10 GM/15ML solution, Take 15 mLs (10 g total) by mouth 3 (three) times daily., Disp: 946 mL, Rfl: 1   metolazone (ZAROXOLYN) 2.5 MG tablet, Take 1 tablet (2.5 mg total) by mouth 3 (three) times a week. Monday, Wednesday and Fridays (Patient taking differently: Take 2.5 mg by mouth every Monday, Wednesday, and Friday.), Disp: 14 tablet, Rfl: 0   metoprolol succinate (TOPROL XL) 25 MG 24 hr tablet, Take 1 tablet (25 mg total) by mouth 2 (two) times daily., Disp: 60 tablet, Rfl: 2   ondansetron (ZOFRAN-ODT) 4 MG disintegrating tablet, TAKE 1 TABLET BY MOUTH EVERY 8 HOURS AS NEEDED FOR NAUSEA OR VOMITING (Patient taking differently: 4 mg every 8 (eight) hours as needed for nausea or vomiting (disolve orally).), Disp: 20 tablet, Rfl: 2   pantoprazole (PROTONIX) 40 MG tablet, Take 40 mg by mouth 2 (two) times daily before a meal., Disp: , Rfl:    polyethylene glycol (MIRALAX / GLYCOLAX) 17 g packet, Take 17 g by mouth as needed for mild constipation (mix and drink as directed)., Disp: , Rfl:    potassium chloride SA (KLOR-CON M) 20 MEQ tablet, TAKE 5 TABLETS BY MOUTH TWICE DAILY. EXTRA 40 MEQ WITH METOLAZONE DAYS., Disp: 260 tablet, Rfl: 0   pravastatin (PRAVACHOL) 20 MG tablet, Take 1 tablet (20 mg total) by mouth daily. (Patient taking differently: Take 20 mg by mouth at bedtime.), Disp: 30 tablet, Rfl: 1   sertraline (ZOLOFT) 50 MG tablet, Take 1 tablet (50 mg total) by mouth daily., Disp: 30 tablet, Rfl: 3   sodium chloride  (OCEAN) 0.65 % SOLN nasal spray, Place 1 spray into both nostrils as needed for congestion., Disp: , Rfl:    tamsulosin (FLOMAX) 0.4 MG CAPS capsule, Take 1 capsule (0.4 mg total) by mouth daily., Disp: 30 capsule, Rfl: 0   torsemide (DEMADEX) 20 MG tablet, Take 100 mg by mouth See admin instructions. Take 100 mg by mouth in the morning and evening, Disp: , Rfl:    triamcinolone (KENALOG) 0.025 % ointment, Apply 1 application topically 2 (two) times daily. (Patient taking differently: Apply 1 application topically 2 (two) times daily as needed (as directed- to affected sites).), Disp: 30 g, Rfl: 3   VENTOLIN HFA 108 (90 Base) MCG/ACT inhaler, Inhale 1 puff into the lungs every 4 (four) hours as needed for shortness of breath. (Patient taking differently: Inhale 2 puffs into the lungs every 4 (four) hours as needed for shortness  of breath.), Disp: 6.7 g, Rfl: 1   zolpidem (AMBIEN) 10 MG tablet, Take 1 tablet (10 mg total) by mouth at bedtime as needed for sleep. (Patient taking differently: Take 10 mg by mouth at bedtime.), Disp: 30 tablet, Rfl: 0 Allergies  Allergen Reactions   Penicillins Other (See Comments)    Caused convulsions as a child  Did it involve swelling of the face/tongue/throat, SOB, or low BP? N/A Did it involve sudden or severe rash/hives, skin peeling, or any reaction on the inside of your mouth or nose? N/A Did you need to seek medical attention at a hospital or doctor's office?N/A When did it last happen? N/A If all above answers are "NO", may proceed with cephalosporin use.     Social History   Socioeconomic History   Marital status: Single    Spouse name: Not on file   Number of children: Not on file   Years of education: Not on file   Highest education level: Not on file  Occupational History   Not on file  Tobacco Use   Smoking status: Never   Smokeless tobacco: Never  Vaping Use   Vaping Use: Never used  Substance and Sexual Activity   Alcohol use: Not  Currently   Drug use: Not Currently    Types: Marijuana    Comment: Smoked for 30 years   Sexual activity: Not on file  Other Topics Concern   Not on file  Social History Narrative   Not on file   Social Determinants of Health   Financial Resource Strain: Medium Risk   Difficulty of Paying Living Expenses: Somewhat hard  Food Insecurity: No Food Insecurity   Worried About Running Out of Food in the Last Year: Never true   Ran Out of Food in the Last Year: Never true  Transportation Needs: Unmet Transportation Needs   Lack of Transportation (Medical): Yes   Lack of Transportation (Non-Medical): No  Physical Activity: Not on file  Stress: Not on file  Social Connections: Not on file  Intimate Partner Violence: Not on file    Physical Exam Vitals reviewed.  Constitutional:      Appearance: Normal appearance. He is obese.  HENT:     Head: Normocephalic.     Nose: Nose normal.     Mouth/Throat:     Mouth: Mucous membranes are moist.     Pharynx: Oropharynx is clear.  Cardiovascular:     Rate and Rhythm: Normal rate and regular rhythm.     Pulses: Normal pulses.     Heart sounds: Normal heart sounds.  Pulmonary:     Effort: Pulmonary effort is normal.     Breath sounds: Normal breath sounds.  Abdominal:     General: Abdomen is flat.     Palpations: Abdomen is soft.  Musculoskeletal:        General: Normal range of motion.     Cervical back: Normal range of motion.     Comments: LEGS IN UNNA BOOTS  Skin:    General: Skin is warm and dry.     Capillary Refill: Capillary refill takes less than 2 seconds.  Neurological:     General: No focal deficit present.     Mental Status: He is alert. Mental status is at baseline.  Psychiatric:        Mood and Affect: Mood normal.        Future Appointments  Date Time Provider [place]  05/16/2021 11:20 AM Larey Dresser, MD  [facility] None  05/20/2021  9:30 AM Zehr, Laban Emperor, PA-C [facility]      ACTION: Home visit completed

## 2021-05-07 ENCOUNTER — Other Ambulatory Visit (HOSPITAL_COMMUNITY): Payer: Self-pay

## 2021-05-07 ENCOUNTER — Encounter (HOSPITAL_COMMUNITY): Payer: Medicare HMO

## 2021-05-07 ENCOUNTER — Telehealth (HOSPITAL_COMMUNITY): Payer: Self-pay | Admitting: Pharmacy Technician

## 2021-05-07 NOTE — Telephone Encounter (Signed)
Patient Advocate Encounter   Received notification from [facility] that prior authorization for Eplerenone is required.   PA submitted on CoverMyMeds Key [id number] Status is pending   Will continue to follow.

## 2021-05-08 ENCOUNTER — Other Ambulatory Visit (HOSPITAL_COMMUNITY): Payer: Self-pay

## 2021-05-08 NOTE — Telephone Encounter (Signed)
Received notification from [facility] regarding a prior authorization for  Eplerenone 57m . Authorization has been APPROVED from 05/07/21 to 04/12/22.   Ran test claim- it was refill too soon until 05/15/21  Phone # [phone]

## 2021-05-13 ENCOUNTER — Other Ambulatory Visit (HOSPITAL_COMMUNITY): Payer: Self-pay | Admitting: Family Medicine

## 2021-05-13 ENCOUNTER — Other Ambulatory Visit (HOSPITAL_COMMUNITY): Payer: Self-pay

## 2021-05-13 MED ORDER — TORSEMIDE 20 MG PO TABS: 100.0000 mg | ORAL_TABLET | ORAL | 11 refills | Status: DC

## 2021-05-13 MED ORDER — METOPROLOL SUCCINATE ER 25 MG PO TB24: 25.0000 mg | ORAL_TABLET | Freq: Every day | ORAL | 2 refills | Status: DC

## 2021-05-13 NOTE — Progress Notes (Signed)
Paramedicine Encounter    Patient ID: Charles Gray, male    DOB: 1955/05/01, 66 y.o.   MRN: [id number]   Arrived for home visit for Charles Gray who reports feeling dizzy and weak today. He says over the last 4-5 days he has been weak, unable to stand for long periods of time due to weakness and dizziness. He stated he almost fainted yesterday because he was so dizzy. He did not check his BP using his home machine at the time. [name]'s vitals were obtained and noted to be positive for ortho statics. BP dropped about 20 pts on assessment and HR increased by 10 pts. I reached out to HF clinic and Dr. [name] ordered to decrease Metoprolol to once daily at bedtime and to wear compression stockings. I made sure Charles Gray was aware of this and pill box reflected same.   I reviewed other medications and filled pill box accordingly.   Weight down 16lbs this week from 394 to 376 this week.   Stone reports he has been eating and drinking normally.   He denied any diarrhea or vomiting. No blood noted.   Refills called into [facility]. -Inspra -Flomax -Torsemide -Zoloft  I plan to see Charles Gray in two weeks at visit with Dr. [name]. I advised him to be sure to use home BP cuff to check his BP when feeling dizzy or light headed and to let me know. He agreed with plan. HHRN and HHPT will be returning on Thursday. Home aide will be out today.   Home visit complete.    Rides set up:  2/7 pick up at 0830 2/14 ride pick up at 0730    Patient Care Team: Enid Skeens., MD as PCP - General (Family Medicine) Berniece Salines, DO as PCP - Cardiology (Cardiology) Jorge Ny, LCSW as Social Worker (Licensed Clinical Social Worker)  Patient Active Problem List   Diagnosis Date Noted   Melena    Gastric polyps    UGIB (upper gastrointestinal bleed) 04/06/2021   Acute encephalopathy 12/21/2020   COPD (chronic obstructive pulmonary disease) ([place]) 12/21/2020   Chronic diastolic (congestive) heart failure  ([place]) 11/14/2020   Syncope 10/28/2020   CHF (congestive heart failure) ([place]) 10/28/2020   Chronic venous stasis dermatitis of both lower extremities 08/27/2020   Cellulitis of both lower extremities 08/14/2020   Microcytic anemia 08/14/2020   GERD without esophagitis 06/10/2020   Atrial fibrillation with rapid ventricular response ([place]) 03/24/2020   Acute on chronic right heart failure ([place]) 01/27/2020   Acute respiratory failure with hypoxia (HCC)    Fatty liver disease, nonalcoholic    Shock ([place])    Personal history of DVT (deep vein thrombosis) 12/26/2019   Chronic anticoagulation 12/26/2019   Palpitations 12/26/2019   AF (paroxysmal atrial fibrillation) ([place]) 12/23/2019   Heart failure, systolic, acute ([place]) 40/98/1191   Acute on chronic congestive heart failure (HCC)    Chronic diastolic HF (heart failure) ([place]) 08/23/2019   Chest pain 08/23/2019   Lactic acidosis 08/23/2019   History of pulmonary embolism 08/12/2019   Atrial tachycardia ([place]) 08/12/2019   Occasional tremors 08/12/2019   OSA (obstructive sleep apnea) 08/12/2019   Acute on chronic right-sided heart failure ([place]) 08/06/2019   Pulmonary embolism ([place]) 06/08/2019   Normocytic anemia 06/08/2019   Pulmonary emboli ([place]) 06/08/2019   Chronic heart failure with preserved ejection fraction ([place]) 05/30/2019   Class 2 severe obesity due to excess calories with serious comorbidity and body mass index (BMI) of 39.0 to 39.9  in adult [facility]) 05/30/2019   Morbid obesity with BMI of 40.0-44.9, adult ([place]) 05/30/2019   Hyponatremia    Drug induced constipation    Peripheral edema    Hypotension due to drugs    Hypoalbuminemia due to protein-calorie malnutrition (HCC)    Hypokalemia    Thrombocytopenia (HCC)    Anemia of chronic disease    Cirrhosis of liver without ascites (HCC)    Chronic pain syndrome    Debility 03/13/2019   Portal hypertensive gastropathy ([place]) 03/06/2019   Dyspnea    GIB (gastrointestinal  bleeding) 03/04/2019   Mixed hyperlipidemia 01/24/2019   Insomnia 01/24/2019   Hyperlipidemia 01/24/2019   Essential hypertension 01/24/2019   Lumbar disc herniation 01/24/2019    Current Outpatient Medications:    acetaminophen (TYLENOL) 500 MG tablet, Take 500-1,000 mg by mouth every 6 (six) hours as needed for mild pain, moderate pain or headache., Disp: , Rfl:    apixaban (ELIQUIS) 5 MG TABS tablet, Take 1 tablet (5 mg total) by mouth 2 (two) times daily., Disp: 60 tablet, Rfl: 0   Buprenorphine HCl-Naloxone HCl 8-2 MG FILM, Place 1 tablet under the tongue 2 (two) times daily., Disp: , Rfl:    eplerenone (INSPRA) 50 MG tablet, TAKE 2 TABLETS BY MOUTH DAILY (Patient taking differently: Take 100 mg by mouth daily.), Disp: 60 tablet, Rfl: 3   gabapentin (NEURONTIN) 800 MG tablet, Take 1 tablet (800 mg total) by mouth 3 (three) times daily., Disp: 90 tablet, Rfl: 1   lactulose (CHRONULAC) 10 GM/15ML solution, Take 15 mLs (10 g total) by mouth 3 (three) times daily., Disp: 946 mL, Rfl: 1   metolazone (ZAROXOLYN) 2.5 MG tablet, Take 1 tablet (2.5 mg total) by mouth 3 (three) times a week. Monday, Wednesday and Fridays (Patient taking differently: Take 2.5 mg by mouth every Monday, Wednesday, and Friday.), Disp: 14 tablet, Rfl: 0   metoprolol succinate (TOPROL XL) 25 MG 24 hr tablet, Take 1 tablet (25 mg total) by mouth 2 (two) times daily., Disp: 60 tablet, Rfl: 2   ondansetron (ZOFRAN-ODT) 4 MG disintegrating tablet, TAKE 1 TABLET BY MOUTH EVERY 8 HOURS AS NEEDED FOR NAUSEA OR VOMITING, Disp: 20 tablet, Rfl: 2   pantoprazole (PROTONIX) 40 MG tablet, Take 40 mg by mouth 2 (two) times daily before a meal., Disp: , Rfl:    polyethylene glycol (MIRALAX / GLYCOLAX) 17 g packet, Take 17 g by mouth as needed for mild constipation (mix and drink as directed)., Disp: , Rfl:    potassium chloride SA (KLOR-CON M) 20 MEQ tablet, TAKE 5 TABLETS BY MOUTH TWICE DAILY. EXTRA 40 MEQ WITH METOLAZONE DAYS., Disp:  260 tablet, Rfl: 0   pravastatin (PRAVACHOL) 20 MG tablet, Take 1 tablet (20 mg total) by mouth daily. (Patient taking differently: Take 20 mg by mouth at bedtime.), Disp: 30 tablet, Rfl: 1   sertraline (ZOLOFT) 50 MG tablet, Take 1 tablet (50 mg total) by mouth daily., Disp: 30 tablet, Rfl: 3   sodium chloride (OCEAN) 0.65 % SOLN nasal spray, Place 1 spray into both nostrils as needed for congestion., Disp: , Rfl:    tamsulosin (FLOMAX) 0.4 MG CAPS capsule, Take 1 capsule (0.4 mg total) by mouth daily., Disp: 30 capsule, Rfl: 0   torsemide (DEMADEX) 20 MG tablet, Take 100 mg by mouth See admin instructions. Take 100 mg by mouth in the morning and evening, Disp: , Rfl:    triamcinolone (KENALOG) 0.025 % ointment, Apply 1 application topically 2 (two) times  daily. (Patient taking differently: Apply 1 application topically 2 (two) times daily as needed (as directed- to affected sites).), Disp: 30 g, Rfl: 3   VENTOLIN HFA 108 (90 Base) MCG/ACT inhaler, Inhale 1 puff into the lungs every 4 (four) hours as needed for shortness of breath. (Patient taking differently: Inhale 2 puffs into the lungs every 4 (four) hours as needed for shortness of breath.), Disp: 6.7 g, Rfl: 1   zolpidem (AMBIEN) 10 MG tablet, Take 1 tablet (10 mg total) by mouth at bedtime as needed for sleep. (Patient taking differently: Take 10 mg by mouth at bedtime.), Disp: 30 tablet, Rfl: 0 Allergies  Allergen Reactions   Penicillins Other (See Comments)    Caused convulsions as a child  Did it involve swelling of the face/tongue/throat, SOB, or low BP? N/A Did it involve sudden or severe rash/hives, skin peeling, or any reaction on the inside of your mouth or nose? N/A Did you need to seek medical attention at a hospital or doctor's office?N/A When did it last happen? N/A If all above answers are "NO", may proceed with cephalosporin use.     Social History   Socioeconomic History   Marital status: Single    Spouse name: Not on  file   Number of children: Not on file   Years of education: Not on file   Highest education level: Not on file  Occupational History   Not on file  Tobacco Use   Smoking status: Never   Smokeless tobacco: Never  Vaping Use   Vaping Use: Never used  Substance and Sexual Activity   Alcohol use: Not Currently   Drug use: Not Currently    Types: Marijuana    Comment: Smoked for 30 years   Sexual activity: Not on file  Other Topics Concern   Not on file  Social History Narrative   Not on file   Social Determinants of Health   Financial Resource Strain: Medium Risk   Difficulty of Paying Living Expenses: Somewhat hard  Food Insecurity: No Food Insecurity   Worried About Running Out of Food in the Last Year: Never true   Ran Out of Food in the Last Year: Never true  Transportation Needs: Unmet Transportation Needs   Lack of Transportation (Medical): Yes   Lack of Transportation (Non-Medical): No  Physical Activity: Not on file  Stress: Not on file  Social Connections: Not on file  Intimate Partner Violence: Not on file    Physical Exam Vitals reviewed.  Constitutional:      Appearance: Normal appearance.  HENT:     Nose: Nose normal.     Mouth/Throat:     Mouth: Mucous membranes are moist.     Pharynx: Oropharynx is clear.  Eyes:     Conjunctiva/sclera: Conjunctivae normal.     Pupils: Pupils are equal, round, and reactive to light.  Cardiovascular:     Rate and Rhythm: Normal rate and regular rhythm.     Pulses: Normal pulses.     Heart sounds: Normal heart sounds.  Pulmonary:     Effort: Pulmonary effort is normal. No respiratory distress.     Breath sounds: Normal breath sounds. No wheezing, rhonchi or rales.  Abdominal:     Palpations: Abdomen is soft.  Musculoskeletal:        General: Swelling present. Normal range of motion.     Cervical back: Normal range of motion.     Right lower leg: Edema present.     Left  lower leg: Edema present.  Skin:     General: Skin is warm and dry.     Capillary Refill: Capillary refill takes less than 2 seconds.  Neurological:     General: No focal deficit present.     Mental Status: He is alert. Mental status is at baseline.     Motor: Weakness present.  Psychiatric:        Mood and Affect: Mood normal.        Future Appointments  Date Time Provider [place]  05/20/2021  9:30 AM Zehr, Tollie Eth [facility]  05/27/2021  8:40 AM Larey Dresser, MD [facility] None     ACTION: Home visit completed

## 2021-05-16 ENCOUNTER — Encounter (HOSPITAL_COMMUNITY): Payer: Medicare HMO | Admitting: Cardiology

## 2021-05-20 ENCOUNTER — Other Ambulatory Visit (INDEPENDENT_AMBULATORY_CARE_PROVIDER_SITE_OTHER): Payer: Medicare HMO

## 2021-05-20 ENCOUNTER — Ambulatory Visit (INDEPENDENT_AMBULATORY_CARE_PROVIDER_SITE_OTHER): Payer: Medicare HMO | Admitting: Gastroenterology

## 2021-05-20 VITALS — BP 148/84 | HR 88 | Ht >= 80 in | Wt 390.0 lb

## 2021-05-20 DIAGNOSIS — K7460 Unspecified cirrhosis of liver: Secondary | ICD-10-CM

## 2021-05-20 DIAGNOSIS — D509 Iron deficiency anemia, unspecified: Secondary | ICD-10-CM | POA: Diagnosis not present

## 2021-05-20 LAB — COMPREHENSIVE METABOLIC PANEL
ALT: 16 U/L (ref 0–53)
AST: 29 U/L (ref 0–37)
Albumin: 3.6 g/dL (ref 3.5–5.2)
Alkaline Phosphatase: 84 U/L (ref 39–117)
BUN: 14 mg/dL (ref 6–23)
CO2: 33 mEq/L — ABNORMAL HIGH (ref 19–32)
Calcium: 9 mg/dL (ref 8.4–10.5)
Chloride: 98 mEq/L (ref 96–112)
Creatinine, Ser: 0.93 mg/dL (ref 0.40–1.50)
GFR: 86.25 mL/min (ref >60.00)
Glucose, Bld: 125 mg/dL — ABNORMAL HIGH (ref 70–99)
Potassium: 3.6 mEq/L (ref 3.5–5.1)
Sodium: 138 mEq/L (ref 135–145)
Total Bilirubin: 0.6 mg/dL (ref 0.2–1.2)
Total Protein: 7.3 g/dL (ref 6.0–8.3)

## 2021-05-20 LAB — IBC + FERRITIN
Ferritin: 18.1 ng/mL — ABNORMAL LOW (ref 22.0–322.0)
Iron: 17 ug/dL — ABNORMAL LOW (ref 42–165)
Saturation Ratios: 3.4 % — ABNORMAL LOW (ref 20.0–50.0)
TIBC: 502.6 ug/dL — ABNORMAL HIGH (ref 250.0–450.0)
Transferrin: 359 mg/dL (ref 212.0–360.0)

## 2021-05-20 LAB — CBC WITH DIFFERENTIAL/PLATELET
Basophils Absolute: 0 10*3/uL (ref 0.0–0.1)
Basophils Relative: 0.6 % (ref 0.0–3.0)
Eosinophils Absolute: 0.1 10*3/uL (ref 0.0–0.7)
Eosinophils Relative: 1.4 % (ref 0.0–5.0)
HCT: 31.3 % — ABNORMAL LOW (ref 39.0–52.0)
Hemoglobin: 9.7 g/dL — ABNORMAL LOW (ref 13.0–17.0)
Lymphocytes Relative: 8.4 % — ABNORMAL LOW (ref 12.0–46.0)
Lymphs Abs: 0.5 10*3/uL — ABNORMAL LOW (ref 0.7–4.0)
MCHC: 30.9 g/dL (ref 30.0–36.0)
MCV: 71.6 fl — ABNORMAL LOW (ref 78.0–100.0)
Monocytes Absolute: 0.5 10*3/uL (ref 0.1–1.0)
Monocytes Relative: 8 % (ref 3.0–12.0)
Neutro Abs: 4.7 10*3/uL (ref 1.4–7.7)
Neutrophils Relative %: 81.6 % — ABNORMAL HIGH (ref 43.0–77.0)
Platelets: 129 10*3/uL — ABNORMAL LOW (ref 150.0–400.0)
RBC: 4.37 Mil/uL (ref 4.22–5.81)
RDW: 18.4 % — ABNORMAL HIGH (ref 11.5–15.5)
WBC: 5.8 10*3/uL (ref 4.0–10.5)

## 2021-05-20 LAB — PROTIME-INR
INR: 1.4 ratio — ABNORMAL HIGH (ref 0.8–1.0)
Prothrombin Time: 15.5 s — ABNORMAL HIGH (ref 9.6–13.1)

## 2021-05-20 NOTE — Patient Instructions (Addendum)
Your provider has requested that you go to the basement level for lab work before leaving today. Press "B" on the elevator. The lab is located at the first door on the left as you exit the elevator.  If you are age 66 or older, your body mass index should be between 23-30. Your Body mass index is 40.78 kg/m. If this is out of the aforementioned range listed, please consider follow up with your Primary Care Provider.  If you are age 36 or younger, your body mass index should be between 19-25. Your Body mass index is 40.78 kg/m. If this is out of the aformentioned range listed, please consider follow up with your Primary Care Provider.   ________________________________________________________  The [facility] providers would like to encourage you to use [facility] to communicate with providers for non-urgent requests or questions.  Due to long hold times on the telephone, sending your provider a message by [facility] may be a faster and more efficient way to get a response.  Please allow 48 business hours for a response.  Please remember that this is for non-urgent requests.  _______________________________________________________

## 2021-05-20 NOTE — Progress Notes (Signed)
05/20/2021 Traquan Duarte [id number] 12-12-1955   HISTORY OF PRESENT ILLNESS:  This is a 66 y.o.male with a pmh significant for CHF, A. Fib on Eliquis, hypertension, hyperlipidemia, OSA, fatty liver disease (based on imaging concern for splenomegaly and cirrhotic and EGD showing portal gastropathy thus likely chronic liver disease).  He was seen by our service during hospital stay in December for concern for upper GI bleeding.  EGD 12/20122: - Normal esophagus. - Portal hypertensive gastropathy. - Two gastric polyps. Biopsied. - Normal duodenal bulb and second portion of the duodenum.  A. STOMACH, ANTRUM, BIOPSY:  - Patchy acute gastritis.  No dysplasia, intestinal metaplasia, or  malignancy.  - Warthin-Starry stain negative for Helicobacter pylori.  - Dilated blood vessels not identified in these superficial biopsies.   Ultrasound 04/06/2021 showed cirrhotic liver with splenomegaly.  Viral hepatitis studies were negative.  Today he tells me that he is just still really weak.  Some days are better than others.  He denies any sign of black or bloody stools since his hospital discharge.  He has not followed up with his PCP or has had any repeat labs so far at this point.  He has an appoint with his cardiologist, Dr. [name], next week.  He is on torsemide to help with his fluid management.  He is taking lactulose 10 g 3 times daily for his constipation and then just uses MiraLAX as needed.  That seems to be working well for him.  He tells me that years ago in [place] he was told that he had fatty liver disease that was hereditary.  He had a colonoscopy in [place] as well, but does not recall details.  He tells me that he does not think he would ever be strong enough to have another colonoscopy.   Past Medical History:  Diagnosis Date   Acute on chronic congestive heart failure (HCC)    Acute on chronic diastolic CHF (congestive heart failure) ([place]) 08/23/2019   Acute  on chronic right-sided heart failure ([place]) 08/06/2019   Acute respiratory failure with hypoxia (HCC)    Anemia of chronic disease    Atrial fibrillation ([place]) 12/23/2019   Atrial tachycardia ([place]) 08/12/2019   Benign essential HTN    Chest pain 08/23/2019   Chronic anticoagulation 12/26/2019   Chronic heart failure with preserved EF 05/30/2019   Echo 07/2019: EF 55-60, GR 1 DD   Chronic pain syndrome    Cirrhosis of liver without ascites ([place])    Class 2 severe obesity due to excess calories with serious comorbidity and body mass index (BMI) of 39.0 to 39.9 in adult ([place]) 05/30/2019   Debility 03/13/2019   Drug induced constipation    Dyspnea    Fatty liver disease, nonalcoholic    GIB (gastrointestinal bleeding) 03/04/2019   Heart failure, systolic, acute ([place]) 4/66/5993   History of pulmonary embolism 08/12/2019   Hyperlipidemia 01/24/2019   Hypertension 01/24/2019   Hypoalbuminemia due to protein-calorie malnutrition (HCC)    Hypokalemia    Hyponatremia    Hypotension due to drugs    Insomnia 01/24/2019   Lactic acidosis 08/23/2019   Lumbar disc herniation 01/24/2019   Mixed hyperlipidemia 01/24/2019   Morbid obesity with BMI of 40.0-44.9, adult ([place]) 05/30/2019   Normocytic anemia 06/08/2019   Occasional tremors 08/12/2019   OSA (obstructive sleep apnea) 08/12/2019   Palpitations 12/26/2019   Peripheral edema    Personal history of DVT (deep vein thrombosis) 12/26/2019   Portal hypertensive gastropathy (  [place]) 03/06/2019   Seen on EGD 03/06/2019   Pulmonary emboli ([place]) 06/08/2019   Pulmonary embolism ([place]) 06/08/2019   Redness and swelling of lower leg 08/23/2019   Shock ([place])    Thrombocytopenia ([place])    Past Surgical History:  Procedure Laterality Date   BIOPSY  04/08/2021   Procedure: BIOPSY;  Surgeon: Ladene Artist, MD;  Location: [facility] ENDOSCOPY;  Service: Endoscopy;;   ESOPHAGOGASTRODUODENOSCOPY N/A 04/08/2021   Procedure: ESOPHAGOGASTRODUODENOSCOPY (EGD);  Surgeon: Ladene Artist, MD;  Location: [facility] ENDOSCOPY;  Service: Endoscopy;  Laterality: N/A;   ESOPHAGOGASTRODUODENOSCOPY (EGD) WITH PROPOFOL N/A 03/05/2019   Procedure: ESOPHAGOGASTRODUODENOSCOPY (EGD) WITH PROPOFOL;  Surgeon: Doran Stabler, MD;  Location: [facility];  Service: Gastroenterology;  Laterality: N/A;  Large Blood Clot Removed   ESOPHAGOGASTRODUODENOSCOPY (EGD) WITH PROPOFOL N/A 03/06/2019   Procedure: ESOPHAGOGASTRODUODENOSCOPY (EGD) WITH PROPOFOL;  Surgeon: Thornton Park, MD;  Location: [facility];  Service: Gastroenterology;  Laterality: N/A;   HEMORROIDECTOMY     PRESSURE SENSOR/CARDIOMEMS N/A 11/14/2020   Procedure: PRESSURE SENSOR/CARDIOMEMS;  Surgeon: Larey Dresser, MD;  Location: [place] [facility];  Service: Cardiovascular;  Laterality: N/A;   RIGHT HEART CATH N/A 08/09/2019   Procedure: RIGHT HEART CATH;  Surgeon: Larey Dresser, MD;  Location: [place] [facility];  Service: Cardiovascular;  Laterality: N/A;   RIGHT HEART CATH N/A 11/01/2020   Procedure: RIGHT HEART CATH;  Surgeon: Larey Dresser, MD;  Location: [place] [facility];  Service: Cardiovascular;  Laterality: N/A;   RIGHT HEART CATH N/A 11/14/2020   Procedure: RIGHT HEART CATH;  Surgeon: Larey Dresser, MD;  Location: [place] [facility];  Service: Cardiovascular;  Laterality: N/A;   RIGHT/LEFT HEART CATH AND CORONARY ANGIOGRAPHY N/A 05/10/2020   Procedure: RIGHT/LEFT HEART CATH AND CORONARY ANGIOGRAPHY;  Surgeon: Larey Dresser, MD;  Location: [place] [facility];  Service: Cardiovascular;  Laterality: N/A;    reports that he has never smoked. He has never used smokeless tobacco. He reports that he does not currently use alcohol. He reports that he does not currently use drugs after having used the following drugs: Marijuana. family history includes CAD in his maternal grandfather and maternal grandmother; Hyperlipidemia in his mother; Hypertension in his mother; Stroke in his mother. Allergies  Allergen  Reactions   Penicillins Other (See Comments)    Caused convulsions as a child  Did it involve swelling of the face/tongue/throat, SOB, or low BP? N/A Did it involve sudden or severe rash/hives, skin peeling, or any reaction on the inside of your mouth or nose? N/A Did you need to seek medical attention at a hospital or doctor's office?N/A When did it last happen? N/A If all above answers are "NO", may proceed with cephalosporin use.      Outpatient Encounter Medications as of 05/20/2021  Medication Sig   acetaminophen (TYLENOL) 500 MG tablet Take 500-1,000 mg by mouth every 6 (six) hours as needed for mild pain, moderate pain or headache.   apixaban (ELIQUIS) 5 MG TABS tablet Take 1 tablet (5 mg total) by mouth 2 (two) times daily.   Buprenorphine HCl-Naloxone HCl 8-2 MG FILM Place 1 tablet under the tongue 2 (two) times daily.   eplerenone (INSPRA) 50 MG tablet TAKE 2 TABLETS BY MOUTH DAILY (Patient taking differently: Take 100 mg by mouth daily.)   gabapentin (NEURONTIN) 800 MG tablet Take 1 tablet (800 mg total) by mouth 3 (three) times daily.   lactulose (CHRONULAC) 10 GM/15ML solution Take  15 mLs (10 g total) by mouth 3 (three) times daily.   metolazone (ZAROXOLYN) 2.5 MG tablet Take 1 tablet (2.5 mg total) by mouth 3 (three) times a week. Monday, Wednesday and Fridays (Patient taking differently: Take 2.5 mg by mouth every Monday, Wednesday, and Friday.)   metoprolol succinate (TOPROL XL) 25 MG 24 hr tablet Take 1 tablet (25 mg total) by mouth at bedtime.   ondansetron (ZOFRAN-ODT) 4 MG disintegrating tablet TAKE 1 TABLET BY MOUTH EVERY 8 HOURS AS NEEDED FOR NAUSEA OR VOMITING   pantoprazole (PROTONIX) 40 MG tablet Take 40 mg by mouth 2 (two) times daily before a meal.   polyethylene glycol (MIRALAX / GLYCOLAX) 17 g packet Take 17 g by mouth as needed for mild constipation (mix and drink as directed).   potassium chloride SA (KLOR-CON M) 20 MEQ tablet TAKE 5 TABLETS BY MOUTH TWICE DAILY.  EXTRA 40 MEQ WITH METOLAZONE DAYS.   pravastatin (PRAVACHOL) 20 MG tablet Take 1 tablet (20 mg total) by mouth daily. (Patient taking differently: Take 20 mg by mouth at bedtime.)   sertraline (ZOLOFT) 50 MG tablet TAKE 1 TABLET BY MOUTH DAILY   sodium chloride (OCEAN) 0.65 % SOLN nasal spray Place 1 spray into both nostrils as needed for congestion.   tamsulosin (FLOMAX) 0.4 MG CAPS capsule Take 1 capsule (0.4 mg total) by mouth daily.   torsemide (DEMADEX) 20 MG tablet Take 5 tablets (100 mg total) by mouth See admin instructions. Take 100 mg by mouth in the morning and evening   triamcinolone (KENALOG) 0.025 % ointment Apply 1 application topically 2 (two) times daily. (Patient taking differently: Apply 1 application topically 2 (two) times daily as needed (as directed- to affected sites).)   VENTOLIN HFA 108 (90 Base) MCG/ACT inhaler Inhale 1 puff into the lungs every 4 (four) hours as needed for shortness of breath. (Patient taking differently: Inhale 2 puffs into the lungs every 4 (four) hours as needed for shortness of breath.)   zolpidem (AMBIEN) 10 MG tablet Take 1 tablet (10 mg total) by mouth at bedtime as needed for sleep. (Patient taking differently: Take 10 mg by mouth at bedtime.)   [DISCONTINUED] bumetanide (BUMEX) 1 MG tablet Take 4 tablets (4 mg total) by mouth daily with breakfast AND 3 tablets (3 mg total) every evening.   [DISCONTINUED] metoprolol tartrate (LOPRESSOR) 50 MG tablet Take 1 tablet (50 mg total) by mouth 2 (two) times daily.   No facility-administered encounter medications on file as of 05/20/2021.    REVIEW OF SYSTEMS  : All other systems reviewed and negative except where noted in the History of Present Illness.   PHYSICAL EXAM: BP (!) 148/84    Pulse 88    Ht 6' 3"  (1.905 m)    Wt (!) 390 lb (176.9 kg)    SpO2 96%    BMI 48.75 kg/m  General: Well developed white male in no acute distress; in wheelchair Head: Normocephalic and atraumatic Eyes:  Sclerae  anicteric, conjunctiva pink. Ears: Normal auditory acuity Lungs: Clear throughout to auscultation; no W/R/R. Heart: Regular rate and rhythm; no M/R/G. Abdomen: Soft, non-distended.  BS present.  Non-tender. Musculoskeletal: Symmetrical with no gross deformities  Skin: No lesions on visible extremities Extremities: Significant swelling in B/L LEs with chronic skin changes. Neurological: Alert oriented x 4, grossly non-focal Psychological:  Alert and cooperative. Normal mood and affect  ASSESSMENT AND PLAN: *Cirrhosis: He said that he was told several years ago in [place] that he  had fatty liver that was hereditary.  We will calculate an updated MELD according to updated labs that will be drawn today.  We will check a CBC, CMP, PT/INR as well as an AFP and other serologies to rule out causes of chronic liver disease. *Upper GI bleed/iron deficiency anemia: Recent EGD with portal hypertensive gastropathy in the setting of anticoagulation with Eliquis we will repeat CBC and iron studies today.  He tells that he does not think he will ever be strong enough for another colonoscopy.  -We will await results from labs before determining neck steps.  CC:  Enid Skeens., MD

## 2021-05-21 ENCOUNTER — Encounter: Payer: Self-pay | Admitting: Gastroenterology

## 2021-05-21 NOTE — Progress Notes (Signed)
____________________________________________________________  Attending physician addendum:  Thank you for sending this case to me. I have reviewed the entire note and agree with the plan.  I also performed extensive chart review since my only encounter with this patient was early in his hospitalization November 2020.  Given his medical comorbidities, he is a poor candidate for further elective endoscopic procedures.  Medical symptom management of his anemia and liver disease is therefore warranted. His long-term prognosis is poor.  Wilfrid Lund, MD  ____________________________________________________________

## 2021-05-22 LAB — ALPHA-1-ANTITRYPSIN: A-1 Antitrypsin, Ser: 202 mg/dL — ABNORMAL HIGH (ref 83–199)

## 2021-05-22 LAB — ANTI-SMOOTH MUSCLE ANTIBODY, IGG: Actin (Smooth Muscle) Antibody (IGG): <20 U (ref <20)

## 2021-05-22 LAB — CERULOPLASMIN: Ceruloplasmin: 32 mg/dL (ref 18–36)

## 2021-05-22 LAB — AFP TUMOR MARKER: AFP-Tumor Marker: 2.8 ng/mL (ref <6.1)

## 2021-05-22 LAB — IGG: IgG (Immunoglobin G), Serum: 1544 mg/dL — ABNORMAL HIGH (ref 600–1540)

## 2021-05-22 LAB — ANA: Anti Nuclear Antibody (ANA): NEGATIVE

## 2021-05-22 LAB — MITOCHONDRIAL ANTIBODIES: Mitochondrial M2 Ab, IgG: <=20 U (ref <=20.0)

## 2021-05-27 ENCOUNTER — Other Ambulatory Visit (HOSPITAL_COMMUNITY): Payer: Self-pay

## 2021-05-27 ENCOUNTER — Other Ambulatory Visit (HOSPITAL_COMMUNITY): Payer: Self-pay | Admitting: Cardiology

## 2021-05-27 ENCOUNTER — Encounter (HOSPITAL_COMMUNITY): Payer: Medicare HMO | Admitting: Cardiology

## 2021-05-27 ENCOUNTER — Telehealth (HOSPITAL_COMMUNITY): Payer: Self-pay

## 2021-05-27 NOTE — Telephone Encounter (Signed)
Samule contacted me at 0700 this morning informing me he needed to cancel and reschedule his appointment at the [facility] for today. He says that he is unable to walk from his living room to his bathroom without urinating on himself and he feels very nauseous. I will be going out for a home visit at 0900. Call complete.

## 2021-05-27 NOTE — Progress Notes (Signed)
Paramedicine Encounter    Patient ID: Charles Gray, male    DOB: 01/21/1956, 66 y.o.   MRN: [id number]   Arrived for home visit for [name] who reports feeling poorly today. He complains of left sided abdominal pain, weakness, trouble walking with urinating on himself due to leg weakness and unable to get to bathroom within a decent time.   He was seen by GI last week.   Legs are swollen with some edema. He admits to drinking Pepsi, Lemonade, eating Cheezits and food with high acid and salt. I reminded him of our conversations in the past of the importance of avoiding these foods and drinks and stressed that this would be causing his weight and swelling to increase there for making him more tired and fatigued.   Vitals and assessment obtained. Weight is up one pound from last visit.   I reviewed medications and confirmed same filling pill box for two weeks.   We reviewed appointments.   Home visit complete. I will see [name] in two weeks.   Refills: Potassium Eliquis Gabapentin Pantoprazole    Patient Care Team: Enid Skeens., MD as PCP - General (Family Medicine) Berniece Salines, DO as PCP - Cardiology (Cardiology) Jorge Ny, LCSW as Social Worker (Licensed Clinical Social Worker)  Patient Active Problem List   Diagnosis Date Noted   Melena    Gastric polyps    UGIB (upper gastrointestinal bleed) 04/06/2021   Acute encephalopathy 12/21/2020   COPD (chronic obstructive pulmonary disease) ([place]) 12/21/2020   Chronic diastolic (congestive) heart failure ([place]) 11/14/2020   Syncope 10/28/2020   CHF (congestive heart failure) ([place]) 10/28/2020   Chronic venous stasis dermatitis of both lower extremities 08/27/2020   Cellulitis of both lower extremities 08/14/2020   Iron deficiency anemia 08/14/2020   GERD without esophagitis 06/10/2020   Atrial fibrillation with rapid ventricular response ([place]) 03/24/2020   Acute on chronic right heart failure ([place]) 01/27/2020   Acute  respiratory failure with hypoxia ([place])    Fatty liver disease, nonalcoholic    Shock ([place])    Personal history of DVT (deep vein thrombosis) 12/26/2019   Chronic anticoagulation 12/26/2019   Palpitations 12/26/2019   AF (paroxysmal atrial fibrillation) ([place]) 12/23/2019   Heart failure, systolic, acute ([place]) 37/16/9678   Acute on chronic congestive heart failure (HCC)    Chronic diastolic HF (heart failure) ([place]) 08/23/2019   Chest pain 08/23/2019   Lactic acidosis 08/23/2019   History of pulmonary embolism 08/12/2019   Atrial tachycardia ([place]) 08/12/2019   Occasional tremors 08/12/2019   OSA (obstructive sleep apnea) 08/12/2019   Acute on chronic right-sided heart failure ([place]) 08/06/2019   Pulmonary embolism ([place]) 06/08/2019   Normocytic anemia 06/08/2019   Pulmonary emboli ([place]) 06/08/2019   Chronic heart failure with preserved ejection fraction ([place]) 05/30/2019   Class 2 severe obesity due to excess calories with serious comorbidity and body mass index (BMI) of 39.0 to 39.9 in adult ([place]) 05/30/2019   Morbid obesity with BMI of 40.0-44.9, adult ([place]) 05/30/2019   Hyponatremia    Drug induced constipation    Peripheral edema    Hypotension due to drugs    Hypoalbuminemia due to protein-calorie malnutrition (HCC)    Hypokalemia    Thrombocytopenia (HCC)    Anemia of chronic disease    Cirrhosis of liver without ascites ([place])    Chronic pain syndrome    Debility 03/13/2019   Portal hypertensive gastropathy ([place]) 03/06/2019   Dyspnea    GIB (gastrointestinal bleeding)  03/04/2019   Mixed hyperlipidemia 01/24/2019   Insomnia 01/24/2019   Hyperlipidemia 01/24/2019   Essential hypertension 01/24/2019   Lumbar disc herniation 01/24/2019    Current Outpatient Medications:    acetaminophen (TYLENOL) 500 MG tablet, Take 500-1,000 mg by mouth every 6 (six) hours as needed for mild pain, moderate pain or headache., Disp: , Rfl:    apixaban (ELIQUIS) 5 MG TABS tablet, Take 1 tablet  (5 mg total) by mouth 2 (two) times daily., Disp: 60 tablet, Rfl: 0   Buprenorphine HCl-Naloxone HCl 8-2 MG FILM, Place 1 tablet under the tongue 2 (two) times daily., Disp: , Rfl:    eplerenone (INSPRA) 50 MG tablet, TAKE 2 TABLETS BY MOUTH DAILY (Patient taking differently: Take 100 mg by mouth daily.), Disp: 60 tablet, Rfl: 3   gabapentin (NEURONTIN) 800 MG tablet, Take 1 tablet (800 mg total) by mouth 3 (three) times daily., Disp: 90 tablet, Rfl: 1   lactulose (CHRONULAC) 10 GM/15ML solution, Take 15 mLs (10 g total) by mouth 3 (three) times daily., Disp: 946 mL, Rfl: 1   metolazone (ZAROXOLYN) 2.5 MG tablet, Take 1 tablet (2.5 mg total) by mouth 3 (three) times a week. Monday, Wednesday and Fridays (Patient taking differently: Take 2.5 mg by mouth every Monday, Wednesday, and Friday.), Disp: 14 tablet, Rfl: 0   metoprolol succinate (TOPROL XL) 25 MG 24 hr tablet, Take 1 tablet (25 mg total) by mouth at bedtime., Disp: 30 tablet, Rfl: 2   ondansetron (ZOFRAN-ODT) 4 MG disintegrating tablet, TAKE 1 TABLET BY MOUTH EVERY 8 HOURS AS NEEDED FOR NAUSEA OR VOMITING, Disp: 20 tablet, Rfl: 2   pantoprazole (PROTONIX) 40 MG tablet, Take 40 mg by mouth 2 (two) times daily before a meal., Disp: , Rfl:    polyethylene glycol (MIRALAX / GLYCOLAX) 17 g packet, Take 17 g by mouth as needed for mild constipation (mix and drink as directed)., Disp: , Rfl:    potassium chloride SA (KLOR-CON M) 20 MEQ tablet, TAKE 5 TABLETS BY MOUTH TWICE DAILY. EXTRA 40 MEQ WITH METOLAZONE DAYS., Disp: 260 tablet, Rfl: 0   pravastatin (PRAVACHOL) 20 MG tablet, Take 1 tablet (20 mg total) by mouth daily. (Patient taking differently: Take 20 mg by mouth at bedtime.), Disp: 30 tablet, Rfl: 1   sertraline (ZOLOFT) 50 MG tablet, TAKE 1 TABLET BY MOUTH DAILY, Disp: 30 tablet, Rfl: 3   sodium chloride (OCEAN) 0.65 % SOLN nasal spray, Place 1 spray into both nostrils as needed for congestion., Disp: , Rfl:    tamsulosin (FLOMAX) 0.4 MG CAPS  capsule, Take 1 capsule (0.4 mg total) by mouth daily., Disp: 30 capsule, Rfl: 0   torsemide (DEMADEX) 20 MG tablet, Take 5 tablets (100 mg total) by mouth See admin instructions. Take 100 mg by mouth in the morning and evening, Disp: 280 tablet, Rfl: 11   triamcinolone (KENALOG) 0.025 % ointment, Apply 1 application topically 2 (two) times daily. (Patient taking differently: Apply 1 application topically 2 (two) times daily as needed (as directed- to affected sites).), Disp: 30 g, Rfl: 3   VENTOLIN HFA 108 (90 Base) MCG/ACT inhaler, Inhale 1 puff into the lungs every 4 (four) hours as needed for shortness of breath. (Patient taking differently: Inhale 2 puffs into the lungs every 4 (four) hours as needed for shortness of breath.), Disp: 6.7 g, Rfl: 1   zolpidem (AMBIEN) 10 MG tablet, Take 1 tablet (10 mg total) by mouth at bedtime as needed for sleep. (Patient taking differently: Take  10 mg by mouth at bedtime.), Disp: 30 tablet, Rfl: 0 Allergies  Allergen Reactions   Penicillins Other (See Comments)    Caused convulsions as a child  Did it involve swelling of the face/tongue/throat, SOB, or low BP? N/A Did it involve sudden or severe rash/hives, skin peeling, or any reaction on the inside of your mouth or nose? N/A Did you need to seek medical attention at a hospital or doctor's office?N/A When did it last happen? N/A If all above answers are "NO", may proceed with cephalosporin use.     Social History   Socioeconomic History   Marital status: Single    Spouse name: Not on file   Number of children: Not on file   Years of education: Not on file   Highest education level: Not on file  Occupational History   Not on file  Tobacco Use   Smoking status: Never   Smokeless tobacco: Never  Vaping Use   Vaping Use: Never used  Substance and Sexual Activity   Alcohol use: Not Currently   Drug use: Not Currently    Types: Marijuana    Comment: Smoked for 30 years   Sexual activity: Not on  file  Other Topics Concern   Not on file  Social History Narrative   Not on file   Social Determinants of Health   Financial Resource Strain: Medium Risk   Difficulty of Paying Living Expenses: Somewhat hard  Food Insecurity: No Food Insecurity   Worried About Running Out of Food in the Last Year: Never true   Ran Out of Food in the Last Year: Never true  Transportation Needs: Unmet Transportation Needs   Lack of Transportation (Medical): Yes   Lack of Transportation (Non-Medical): No  Physical Activity: Not on file  Stress: Not on file  Social Connections: Not on file  Intimate Partner Violence: Not on file    Physical Exam Vitals reviewed.  Constitutional:      Appearance: Normal appearance. He is normal weight.  HENT:     Head: Normocephalic.     Nose: Nose normal.     Mouth/Throat:     Mouth: Mucous membranes are moist.     Pharynx: Oropharynx is clear.  Eyes:     Conjunctiva/sclera: Conjunctivae normal.     Pupils: Pupils are equal, round, and reactive to light.  Cardiovascular:     Rate and Rhythm: Normal rate and regular rhythm.     Pulses: Normal pulses.     Heart sounds: Normal heart sounds.  Pulmonary:     Effort: Pulmonary effort is normal.     Breath sounds: Normal breath sounds.  Abdominal:     General: There is distension.     Tenderness: There is abdominal tenderness.  Musculoskeletal:        General: Swelling present. Normal range of motion.     Cervical back: Normal range of motion.     Right lower leg: Edema present.     Left lower leg: Edema present.  Skin:    General: Skin is warm and dry.     Capillary Refill: Capillary refill takes less than 2 seconds.  Neurological:     General: No focal deficit present.     Mental Status: He is alert. Mental status is at baseline.  Psychiatric:        Mood and Affect: Mood normal.        Future Appointments  Date Time Provider [place]  05/27/2021  8:40 AM  Larey Dresser, MD [facility]  None     ACTION:  Home visit completed

## 2021-06-05 ENCOUNTER — Telehealth (HOSPITAL_COMMUNITY): Payer: Self-pay | Admitting: Adult Health

## 2021-06-05 NOTE — Telephone Encounter (Signed)
° °  Cardiomems Reading 14. Goal 10  Instructed to take metolazone today instead of tomorrow.  Mr Moldovan verbalized understanding recommendations.   Elvi Leventhal NP-C  4:20 PM

## 2021-06-06 ENCOUNTER — Telehealth: Payer: Self-pay | Admitting: Physician Assistant

## 2021-06-06 NOTE — Telephone Encounter (Addendum)
Pt lives alone.   His weight today was 382, last week was 394.  He has LE edema, abd is swollen. He has been SOB for a couple of days, has been dry-heaving as well.   He has been having cramping pains in his legs as well.   Constipated, no recent BM.  Was called by [name] yesterday, Cardiomems was 14, goal 10.  He took metolazone as directed, but did not feel much better today. He did not take his am torsemide, thought he might get called to come into the hospital.  Advised him he really should come in, he is reluctant. Says he will have to call 911.   If he does not come in, advised him to take his torsemide tonight and see if he feels better tomorrow.   Rosaria Ferries, PA-C 06/06/2021 7:03 PM

## 2021-06-08 NOTE — Telephone Encounter (Signed)
Does not need to come into hospital.  Cardiomems is not that much above baseline.  Needs followup with APP in clinic this week.

## 2021-06-09 NOTE — Telephone Encounter (Signed)
Pt aware via [place] Will arrange followup at his home visit 06/10/21

## 2021-06-10 ENCOUNTER — Other Ambulatory Visit (HOSPITAL_COMMUNITY): Payer: Self-pay

## 2021-06-10 ENCOUNTER — Telehealth (HOSPITAL_COMMUNITY): Payer: Self-pay

## 2021-06-10 DIAGNOSIS — M7989 Other specified soft tissue disorders: Secondary | ICD-10-CM

## 2021-06-10 NOTE — Progress Notes (Signed)
Paramedicine Encounter    Patient ID: Charles Gray, male    DOB: 04/24/1955, 66 y.o.   MRN: [id number]  Arrived for home visit for Charles Gray who reports he has been feeling "sick" he reports after having PT last week he got nauseated and felt very fatigued as well and stated for three days he was dry heaving, short of breath and had lower leg swelling. Today he says he is feeling okay but still very tired. He has not been exercising daily and physical therapy seems to make him tired. I explained to him that he needs to condition himself for at least 20 mins a day to help him increase his stamina so when he has PT it isn't as hard for him. He was not happy with this advise.   [facility] RN came out yesterday to wrap Charles Gray legs and plans to come out again on Thursday. PT and OT are planning on coming out today.   He has no follow up with PCP coming up, I advised him to seek a PCP appointment. He agreed.   Last week he was instructed by HF clinic to take metolazone on Thursday rather than Friday. His weight last week was 381, week before that was 391. Today he is at 377lbs. Lungs clear today. He has been compliant with meds but no with his diet. He is drinking pepsis, eating crackers, cookies and snacks daily. I discussed this with him at length. He verbalized understanding.    I reviewed all meds and confirmed same filling one pill box for one week.   I informed Charles Gray of the transportation changes with him no longer being able to use [employer] and he was very upset. I gave him resources for [employer], [employer] and [employer]. He was disgruntled but verbalized understanding.   I asked him if he felt he needed to be seen in clinic next week and he said no. I will be following up in the home next Tuesday. Home visit complete.   Refills: Torsemide Pantoprazole Inspra  Sertraline Zofran         Patient Care Team: Enid Skeens., MD as PCP - General (Family  Medicine) Berniece Salines, DO as PCP - Cardiology (Cardiology) Jorge Ny, LCSW as Social Worker (Licensed Clinical Social Worker)  Patient Active Problem List   Diagnosis Date Noted   Melena    Gastric polyps    UGIB (upper gastrointestinal bleed) 04/06/2021   Acute encephalopathy 12/21/2020   COPD (chronic obstructive pulmonary disease) ([place]) 12/21/2020   Chronic diastolic (congestive) heart failure ([place]) 11/14/2020   Syncope 10/28/2020   CHF (congestive heart failure) ([place]) 10/28/2020   Chronic venous stasis dermatitis of both lower extremities 08/27/2020   Cellulitis of both lower extremities 08/14/2020   Iron deficiency anemia 08/14/2020   GERD without esophagitis 06/10/2020   Atrial fibrillation with rapid ventricular response ([place]) 03/24/2020   Acute on chronic right heart failure ([place]) 01/27/2020   Acute respiratory failure with hypoxia ([place])    Fatty liver disease, nonalcoholic    Shock ([place])    Personal history of DVT (deep vein thrombosis) 12/26/2019   Chronic anticoagulation 12/26/2019   Palpitations 12/26/2019   AF (paroxysmal atrial fibrillation) ([place]) 12/23/2019   Heart failure, systolic, acute ([place]) 65/68/1275   Acute on chronic congestive heart failure (HCC)    Chronic diastolic HF (heart failure) ([place]) 08/23/2019   Chest pain 08/23/2019   Lactic acidosis 08/23/2019   History of pulmonary embolism 08/12/2019   Atrial  tachycardia ([place]) 08/12/2019   Occasional tremors 08/12/2019   OSA (obstructive sleep apnea) 08/12/2019   Acute on chronic right-sided heart failure ([place]) 08/06/2019   Pulmonary embolism ([place]) 06/08/2019   Normocytic anemia 06/08/2019   Pulmonary emboli ([name]) 06/08/2019   Chronic heart failure with preserved ejection fraction ([place]) 05/30/2019   Class 2 severe obesity due to excess calories with serious comorbidity and body mass index (BMI) of 39.0 to 39.9 in adult ([place]) 05/30/2019   Morbid obesity with BMI of 40.0-44.9, adult ([place]) 05/30/2019    Hyponatremia    Drug induced constipation    Peripheral edema    Hypotension due to drugs    Hypoalbuminemia due to protein-calorie malnutrition (HCC)    Hypokalemia    Thrombocytopenia (HCC)    Anemia of chronic disease    Cirrhosis of liver without ascites (HCC)    Chronic pain syndrome    Debility 03/13/2019   Portal hypertensive gastropathy ([place]) 03/06/2019   Dyspnea    GIB (gastrointestinal bleeding) 03/04/2019   Mixed hyperlipidemia 01/24/2019   Insomnia 01/24/2019   Hyperlipidemia 01/24/2019   Essential hypertension 01/24/2019   Lumbar disc herniation 01/24/2019    Current Outpatient Medications:    acetaminophen (TYLENOL) 500 MG tablet, Take 500-1,000 mg by mouth every 6 (six) hours as needed for mild pain, moderate pain or headache., Disp: , Rfl:    apixaban (ELIQUIS) 5 MG TABS tablet, Take 1 tablet (5 mg total) by mouth 2 (two) times daily., Disp: 60 tablet, Rfl: 0   Buprenorphine HCl-Naloxone HCl 8-2 MG FILM, Place 1 tablet under the tongue 2 (two) times daily., Disp: , Rfl:    eplerenone (INSPRA) 50 MG tablet, TAKE 2 TABLETS BY MOUTH DAILY (Patient taking differently: Take 100 mg by mouth daily.), Disp: 60 tablet, Rfl: 3   gabapentin (NEURONTIN) 800 MG tablet, Take 1 tablet (800 mg total) by mouth 3 (three) times daily., Disp: 90 tablet, Rfl: 1   lactulose (CHRONULAC) 10 GM/15ML solution, Take 15 mLs (10 g total) by mouth 3 (three) times daily., Disp: 946 mL, Rfl: 1   metolazone (ZAROXOLYN) 2.5 MG tablet, Take 1 tablet (2.5 mg total) by mouth 3 (three) times a week. Monday, Wednesday and Fridays (Patient taking differently: Take 2.5 mg by mouth every Monday, Wednesday, and Friday.), Disp: 14 tablet, Rfl: 0   metoprolol succinate (TOPROL XL) 25 MG 24 hr tablet, Take 1 tablet (25 mg total) by mouth at bedtime., Disp: 30 tablet, Rfl: 2   ondansetron (ZOFRAN-ODT) 4 MG disintegrating tablet, TAKE 1 TABLET BY MOUTH EVERY 8 HOURS AS NEEDED FOR NAUSEA OR VOMITING, Disp: 20 tablet,  Rfl: 2   pantoprazole (PROTONIX) 40 MG tablet, Take 40 mg by mouth 2 (two) times daily before a meal., Disp: , Rfl:    polyethylene glycol (MIRALAX / GLYCOLAX) 17 g packet, Take 17 g by mouth as needed for mild constipation (mix and drink as directed)., Disp: , Rfl:    potassium chloride SA (KLOR-CON M) 20 MEQ tablet, TAKE 5 TABLETS BY MOUTH TWICE DAILY. EXTRA 40 MEQ WITH METOLAZONE DAYS., Disp: 260 tablet, Rfl: 0   pravastatin (PRAVACHOL) 20 MG tablet, Take 1 tablet (20 mg total) by mouth daily. (Patient taking differently: Take 20 mg by mouth at bedtime.), Disp: 30 tablet, Rfl: 1   sertraline (ZOLOFT) 50 MG tablet, TAKE 1 TABLET BY MOUTH DAILY, Disp: 30 tablet, Rfl: 3   sodium chloride (OCEAN) 0.65 % SOLN nasal spray, Place 1 spray into both nostrils as needed for  congestion., Disp: , Rfl:    tamsulosin (FLOMAX) 0.4 MG CAPS capsule, Take 1 capsule (0.4 mg total) by mouth daily., Disp: 30 capsule, Rfl: 0   torsemide (DEMADEX) 20 MG tablet, Take 5 tablets (100 mg total) by mouth See admin instructions. Take 100 mg by mouth in the morning and evening, Disp: 280 tablet, Rfl: 11   triamcinolone (KENALOG) 0.025 % ointment, Apply 1 application topically 2 (two) times daily. (Patient taking differently: Apply 1 application topically 2 (two) times daily as needed (as directed- to affected sites).), Disp: 30 g, Rfl: 3   VENTOLIN HFA 108 (90 Base) MCG/ACT inhaler, Inhale 1 puff into the lungs every 4 (four) hours as needed for shortness of breath. (Patient taking differently: Inhale 2 puffs into the lungs every 4 (four) hours as needed for shortness of breath.), Disp: 6.7 g, Rfl: 1   zolpidem (AMBIEN) 10 MG tablet, Take 1 tablet (10 mg total) by mouth at bedtime as needed for sleep. (Patient taking differently: Take 10 mg by mouth at bedtime.), Disp: 30 tablet, Rfl: 0 Allergies  Allergen Reactions   Penicillins Other (See Comments)    Caused convulsions as a child  Did it involve swelling of the  face/tongue/throat, SOB, or low BP? N/A Did it involve sudden or severe rash/hives, skin peeling, or any reaction on the inside of your mouth or nose? N/A Did you need to seek medical attention at a hospital or doctor's office?N/A When did it last happen? N/A If all above answers are "NO", may proceed with cephalosporin use.     Social History   Socioeconomic History   Marital status: Single    Spouse name: Not on file   Number of children: Not on file   Years of education: Not on file   Highest education level: Not on file  Occupational History   Not on file  Tobacco Use   Smoking status: Never   Smokeless tobacco: Never  Vaping Use   Vaping Use: Never used  Substance and Sexual Activity   Alcohol use: Not Currently   Drug use: Not Currently    Types: Marijuana    Comment: Smoked for 30 years   Sexual activity: Not on file  Other Topics Concern   Not on file  Social History Narrative   Not on file   Social Determinants of Health   Financial Resource Strain: Medium Risk   Difficulty of Paying Living Expenses: Somewhat hard  Food Insecurity: No Food Insecurity   Worried About Running Out of Food in the Last Year: Never true   Ran Out of Food in the Last Year: Never true  Transportation Needs: Unmet Transportation Needs   Lack of Transportation (Medical): Yes   Lack of Transportation (Non-Medical): No  Physical Activity: Not on file  Stress: Not on file  Social Connections: Not on file  Intimate Partner Violence: Not on file    Physical Exam Vitals reviewed.  Constitutional:      Appearance: Normal appearance. He is normal weight.  HENT:     Head: Normocephalic.     Mouth/Throat:     Mouth: Mucous membranes are moist.     Pharynx: Oropharynx is clear.  Eyes:     Pupils: Pupils are equal, round, and reactive to light.  Cardiovascular:     Rate and Rhythm: Normal rate and regular rhythm.     Pulses: Normal pulses.     Heart sounds: Normal heart sounds.   Pulmonary:     Effort:  Pulmonary effort is normal.     Breath sounds: Normal breath sounds.  Abdominal:     General: Abdomen is flat.     Palpations: Abdomen is soft.  Musculoskeletal:        General: Swelling present. Normal range of motion.     Cervical back: Normal range of motion.     Right lower leg: Edema present.     Left lower leg: Edema present.     Comments: Legs wrapped by RN Abigail Butts with [facility] yesterday.   Skin:    General: Skin is warm and dry.     Capillary Refill: Capillary refill takes less than 2 seconds.  Neurological:     General: No focal deficit present.     Mental Status: He is alert. Mental status is at baseline.        Future Appointments  Date Time Provider [place]  08/05/2021 11:40 AM Larey Dresser, MD [facility] None     ACTION: Home visit completed

## 2021-06-10 NOTE — Telephone Encounter (Signed)
Charles Gray let me know his home health RN Abigail Butts from [facility] is requesting ABI for [facility]. She can be reached at [phone]

## 2021-06-12 NOTE — Telephone Encounter (Signed)
Ok to order ABI's?  ?

## 2021-06-12 NOTE — Telephone Encounter (Signed)
Yes, ok to order

## 2021-06-17 ENCOUNTER — Other Ambulatory Visit (HOSPITAL_COMMUNITY): Payer: Self-pay

## 2021-06-17 NOTE — Progress Notes (Signed)
Paramedicine Encounter    Patient ID: Charles Gray, male    DOB: Aug 28, 1955, 66 y.o.   MRN: [id number]   Arrived for home visit for [name] who reports feeling "sick". He reports shortness of breath and feeling tired. This is not abnormal for him. Vitals and assessment are as noted in report. Legs noted to be bilaterally wrapped by [facility] RN, she returns on Thursday to re-wrap. Lungs noted to be diminished in the right lower lobe but clear in other lobes.   Medications reviewed and confirmed. Pill boxes filled for two weeks.   I reviewed appointments with [name].  [facility] going forward.  He advised he has had an itchy throat and eyes as well as some  increased shortness of breath, he sits with his balcony door open. I advised him to take a daily allergy pill to help and to decrease the amount of time the door is open due to seasonal pollen. He agreed.   I plan to see [name] in two weeks. I encouraged him to submit daily Cardiomems readings as well as watching his fluid and sodium intakes. He agreed with plans.   Refills: Inspra     Patient Care Team: Enid Skeens., MD as PCP - General (Family Medicine) Berniece Salines, DO as PCP - Cardiology (Cardiology) Jorge Ny, LCSW as Social Worker (Licensed Clinical Social Worker)  Patient Active Problem List   Diagnosis Date Noted   Melena    Gastric polyps    UGIB (upper gastrointestinal bleed) 04/06/2021   Acute encephalopathy 12/21/2020   COPD (chronic obstructive pulmonary disease) ([place]) 12/21/2020   Chronic diastolic (congestive) heart failure ([place]) 11/14/2020   Syncope 10/28/2020   CHF (congestive heart failure) ([place]) 10/28/2020   Chronic venous stasis dermatitis of both lower extremities 08/27/2020   Cellulitis of both lower extremities 08/14/2020   Iron deficiency anemia 08/14/2020   GERD without esophagitis 06/10/2020   Atrial fibrillation with rapid ventricular  response ([place]) 03/24/2020   Acute on chronic right heart failure ([place]) 01/27/2020   Acute respiratory failure with hypoxia ([place])    Fatty liver disease, nonalcoholic    Shock ([place])    Personal history of DVT (deep vein thrombosis) 12/26/2019   Chronic anticoagulation 12/26/2019   Palpitations 12/26/2019   AF (paroxysmal atrial fibrillation) ([place]) 12/23/2019   Heart failure, systolic, acute ([place]) 26/41/5830   Acute on chronic congestive heart failure (HCC)    Chronic diastolic HF (heart failure) ([place]) 08/23/2019   Chest pain 08/23/2019   Lactic acidosis 08/23/2019   History of pulmonary embolism 08/12/2019   Atrial tachycardia ([place]) 08/12/2019   Occasional tremors 08/12/2019   OSA (obstructive sleep apnea) 08/12/2019   Acute on chronic right-sided heart failure ([place]) 08/06/2019   Pulmonary embolism ([place]) 06/08/2019   Normocytic anemia 06/08/2019   Pulmonary emboli ([place]) 06/08/2019   Chronic heart failure with preserved ejection fraction ([place]) 05/30/2019   Class 2 severe obesity due to excess calories with serious comorbidity and body mass index (BMI) of 39.0 to 39.9 in adult ([place]) 05/30/2019   Morbid obesity with BMI of 40.0-44.9, adult ([place]) 05/30/2019   Hyponatremia    Drug induced constipation    Peripheral edema    Hypotension due to drugs    Hypoalbuminemia due to protein-calorie malnutrition (HCC)    Hypokalemia    Thrombocytopenia (HCC)    Anemia of chronic disease    Cirrhosis of liver without ascites (HCC)    Chronic  pain syndrome    Debility 03/13/2019   Portal hypertensive gastropathy ([place]) 03/06/2019   Dyspnea    GIB (gastrointestinal bleeding) 03/04/2019   Mixed hyperlipidemia 01/24/2019   Insomnia 01/24/2019   Hyperlipidemia 01/24/2019   Essential hypertension 01/24/2019   Lumbar disc herniation 01/24/2019    Current Outpatient Medications:    acetaminophen (TYLENOL) 500 MG tablet, Take 500-1,000 mg by mouth every 6 (six) hours as needed for mild pain,  moderate pain or headache., Disp: , Rfl:    apixaban (ELIQUIS) 5 MG TABS tablet, Take 1 tablet (5 mg total) by mouth 2 (two) times daily., Disp: 60 tablet, Rfl: 0   Buprenorphine HCl-Naloxone HCl 8-2 MG FILM, Place 1 tablet under the tongue 2 (two) times daily., Disp: , Rfl:    eplerenone (INSPRA) 50 MG tablet, TAKE 2 TABLETS BY MOUTH DAILY (Patient taking differently: Take 100 mg by mouth daily.), Disp: 60 tablet, Rfl: 3   gabapentin (NEURONTIN) 800 MG tablet, Take 1 tablet (800 mg total) by mouth 3 (three) times daily., Disp: 90 tablet, Rfl: 1   lactulose (CHRONULAC) 10 GM/15ML solution, Take 15 mLs (10 g total) by mouth 3 (three) times daily., Disp: 946 mL, Rfl: 1   metolazone (ZAROXOLYN) 2.5 MG tablet, Take 1 tablet (2.5 mg total) by mouth 3 (three) times a week. Monday, Wednesday and Fridays (Patient taking differently: Take 2.5 mg by mouth every Monday, Wednesday, and Friday.), Disp: 14 tablet, Rfl: 0   metoprolol succinate (TOPROL XL) 25 MG 24 hr tablet, Take 1 tablet (25 mg total) by mouth at bedtime., Disp: 30 tablet, Rfl: 2   ondansetron (ZOFRAN-ODT) 4 MG disintegrating tablet, TAKE 1 TABLET BY MOUTH EVERY 8 HOURS AS NEEDED FOR NAUSEA OR VOMITING, Disp: 20 tablet, Rfl: 2   pantoprazole (PROTONIX) 40 MG tablet, Take 40 mg by mouth 2 (two) times daily before a meal., Disp: , Rfl:    polyethylene glycol (MIRALAX / GLYCOLAX) 17 g packet, Take 17 g by mouth as needed for mild constipation (mix and drink as directed)., Disp: , Rfl:    potassium chloride SA (KLOR-CON M) 20 MEQ tablet, TAKE 5 TABLETS BY MOUTH TWICE DAILY. EXTRA 40 MEQ WITH METOLAZONE DAYS., Disp: 260 tablet, Rfl: 0   pravastatin (PRAVACHOL) 20 MG tablet, Take 1 tablet (20 mg total) by mouth daily. (Patient taking differently: Take 20 mg by mouth at bedtime.), Disp: 30 tablet, Rfl: 1   sertraline (ZOLOFT) 50 MG tablet, TAKE 1 TABLET BY MOUTH DAILY, Disp: 30 tablet, Rfl: 3   sodium chloride (OCEAN) 0.65 % SOLN nasal spray, Place 1  spray into both nostrils as needed for congestion., Disp: , Rfl:    tamsulosin (FLOMAX) 0.4 MG CAPS capsule, Take 1 capsule (0.4 mg total) by mouth daily., Disp: 30 capsule, Rfl: 0   torsemide (DEMADEX) 20 MG tablet, Take 5 tablets (100 mg total) by mouth See admin instructions. Take 100 mg by mouth in the morning and evening, Disp: 280 tablet, Rfl: 11   triamcinolone (KENALOG) 0.025 % ointment, Apply 1 application topically 2 (two) times daily. (Patient taking differently: Apply 1 application topically 2 (two) times daily as needed (as directed- to affected sites).), Disp: 30 g, Rfl: 3   VENTOLIN HFA 108 (90 Base) MCG/ACT inhaler, Inhale 1 puff into the lungs every 4 (four) hours as needed for shortness of breath. (Patient taking differently: Inhale 2 puffs into the lungs every 4 (four) hours as needed for shortness of breath.), Disp: 6.7 g, Rfl: 1  zolpidem (AMBIEN) 10 MG tablet, Take 1 tablet (10 mg total) by mouth at bedtime as needed for sleep. (Patient taking differently: Take 10 mg by mouth at bedtime.), Disp: 30 tablet, Rfl: 0 Allergies  Allergen Reactions   Penicillins Other (See Comments)    Caused convulsions as a child  Did it involve swelling of the face/tongue/throat, SOB, or low BP? N/A Did it involve sudden or severe rash/hives, skin peeling, or any reaction on the inside of your mouth or nose? N/A Did you need to seek medical attention at a hospital or doctor's office?N/A When did it last happen? N/A If all above answers are "NO", may proceed with cephalosporin use.     Social History   Socioeconomic History   Marital status: Single    Spouse name: Not on file   Number of children: Not on file   Years of education: Not on file   Highest education level: Not on file  Occupational History   Not on file  Tobacco Use   Smoking status: Never   Smokeless tobacco: Never  Vaping Use   Vaping Use: Never used  Substance and Sexual Activity   Alcohol use: Not Currently    Drug use: Not Currently    Types: Marijuana    Comment: Smoked for 30 years   Sexual activity: Not on file  Other Topics Concern   Not on file  Social History Narrative   Not on file   Social Determinants of Health   Financial Resource Strain: Medium Risk   Difficulty of Paying Living Expenses: Somewhat hard  Food Insecurity: No Food Insecurity   Worried About Running Out of Food in the Last Year: Never true   Ran Out of Food in the Last Year: Never true  Transportation Needs: Unmet Transportation Needs   Lack of Transportation (Medical): Yes   Lack of Transportation (Non-Medical): No  Physical Activity: Not on file  Stress: Not on file  Social Connections: Not on file  Intimate Partner Violence: Not on file    Physical Exam Vitals reviewed.  Constitutional:      Appearance: Normal appearance. He is obese.  HENT:     Head: Normocephalic.     Nose: Nose normal.     Mouth/Throat:     Mouth: Mucous membranes are moist.     Pharynx: Oropharynx is clear.  Eyes:     Conjunctiva/sclera: Conjunctivae normal.     Pupils: Pupils are equal, round, and reactive to light.  Cardiovascular:     Rate and Rhythm: Normal rate and regular rhythm.     Pulses: Normal pulses.     Heart sounds: Normal heart sounds.  Pulmonary:     Effort: Pulmonary effort is normal.     Breath sounds: Normal breath sounds.     Comments: Diminished in lower right lobe. Abdominal:     General: Abdomen is flat. There is distension.     Palpations: Abdomen is soft.  Musculoskeletal:        General: Swelling and tenderness present. Normal range of motion.     Cervical back: Normal range of motion.     Right lower leg: Edema present.     Left lower leg: Edema present.  Skin:    General: Skin is warm and dry.     Capillary Refill: Capillary refill takes less than 2 seconds.  Neurological:     General: No focal deficit present.     Mental Status: He is alert. Mental status is at  baseline.  Psychiatric:         Mood and Affect: Mood normal.        Future Appointments  Date Time Provider [place]  08/05/2021 11:40 AM Larey Dresser, MD [facility] None     ACTION: Home visit completed

## 2021-06-18 ENCOUNTER — Telehealth (HOSPITAL_COMMUNITY): Payer: Self-pay | Admitting: *Deleted

## 2021-06-18 NOTE — Telephone Encounter (Signed)
-----   Message from Orma Render, [place] sent at 06/18/2021 12:22 PM EST ----- ?Regarding: RE: potassium ?So I dont know how to order that without seeing it on the shelf. I would try to get the regular Klor-Con and not Klor-Con M and see if that makes a difference. The product number I found in Epic that might work was [id number]. We can see if that helps. However, if it does not, I would recommend talking to his actual pharmacy. They should be able to change the [facility] number to whatever tablet is coated based on what they have on the shelf since the prescription is not changing.  ? ?Lauren  ? ? ?----- Message ----- ?From: Harvie Junior, CMA ?Sent: 06/17/2021   4:58 PM EST ?To: Orma Render, RPH-CPP ?Subject: FW: potassium                                 ? ?Can you please tell me how to order the correct K for pt..he wants the coated tablets?  ?----- Message ----- ?From: Rafael Bihari, FNP ?Sent: 06/17/2021  12:47 PM EST ?To: Harvie Junior, Ila ?Subject: FW: potassium                                 ? ?Mason Jim can you send this in please, thank you ?----- Message ----- ?From: [name], EMT ?Sent: 06/17/2021  11:31 AM EST ?To: Rafael Bihari, FNP ?Subject: potassium                                     ? ?Hey, could we send in RX for the coated potassium tablets for Mr. Muscato? I believe he is having frequent GI upset from the uncoated ones.  ? ?Thanks! ? ? ? ? ?

## 2021-06-18 NOTE — Telephone Encounter (Signed)
Charles Gray will follow up with pharmacy to discuss options.  ?

## 2021-06-19 NOTE — Addendum Note (Signed)
Addended by: Elo Marmolejos, Sharlot Gowda on: 06/19/2021 10:24 AM ? ? Modules accepted: Orders ? ?

## 2021-07-01 ENCOUNTER — Other Ambulatory Visit (HOSPITAL_COMMUNITY): Payer: Self-pay | Admitting: Cardiology

## 2021-07-01 ENCOUNTER — Other Ambulatory Visit (HOSPITAL_COMMUNITY): Payer: Self-pay

## 2021-07-01 NOTE — Progress Notes (Signed)
Paramedicine Encounter ? ? ? Patient ID: Charles Gray, male    DOB: 1955-05-23, 66 y.o.   MRN: [id number] ? ?Arrived for home visit for Dayshaun who reports feeling weak, tired and short of breath with lower leg swelling and redness with pain for a week. He reports getting his legs wrapped by [facility] over the weekend but states last night he urinated on himself and pulled the wraps off and noted the swelling, redness and weeping. Today he has trouble walking to bathroom and back due to shortness of breath, fatigue and weakness. His legs were red, hot to touch and swollen on assessment. I contacted HF clinic and notified them of same. They report cardiomems reading being 8 yesterday and suggested contacting PCP for possible cellulitis and follow up of stasis dermatitis. I contacted PCP and he will be seen today by Dr. Wendie Agreste at 1400.  ? ?Vitals and assessment as noted.  ?Meds reviewed and confirmed.  ?Pill box filled accordingly.  ? ?Appointments reviewed and heart failure management education discussed. Home visit complete.  ? ?Refills: ?Eliquis ?Potassium  ? ? ?Patient Care Team: ?Enid Skeens., MD as PCP - General (Family Medicine) ?Berniece Salines, DO as PCP - Cardiology (Cardiology) ?Jorge Ny, LCSW as Education officer, museum (Licensed Holiday representative) ? ?Patient Active Problem List  ? Diagnosis Date Noted  ? Melena   ? Gastric polyps   ? UGIB (upper gastrointestinal bleed) 04/06/2021  ? Acute encephalopathy 12/21/2020  ? COPD (chronic obstructive pulmonary disease) ([place]) 12/21/2020  ? Chronic diastolic (congestive) heart failure ([place]) 11/14/2020  ? Syncope 10/28/2020  ? CHF (congestive heart failure) ([place]) 10/28/2020  ? Chronic venous stasis dermatitis of both lower extremities 08/27/2020  ? Cellulitis of both lower extremities 08/14/2020  ? Iron deficiency anemia 08/14/2020  ? GERD without esophagitis 06/10/2020  ? Atrial fibrillation with rapid ventricular response ([place]) 03/24/2020  ? Acute on chronic  right heart failure ([place]) 01/27/2020  ? Acute respiratory failure with hypoxia ([place])   ? Fatty liver disease, nonalcoholic   ? Shock ([place])   ? Personal history of DVT (deep vein thrombosis) 12/26/2019  ? Chronic anticoagulation 12/26/2019  ? Palpitations 12/26/2019  ? AF (paroxysmal atrial fibrillation) ([place]) 12/23/2019  ? Heart failure, systolic, acute ([place]) 71/09/2692  ? Acute on chronic congestive heart failure ([place])   ? Chronic diastolic HF (heart failure) ([place]) 08/23/2019  ? Chest pain 08/23/2019  ? Lactic acidosis 08/23/2019  ? History of pulmonary embolism 08/12/2019  ? Atrial tachycardia ([place]) 08/12/2019  ? Occasional tremors 08/12/2019  ? OSA (obstructive sleep apnea) 08/12/2019  ? Acute on chronic right-sided heart failure ([place]) 08/06/2019  ? Pulmonary embolism ([place]) 06/08/2019  ? Normocytic anemia 06/08/2019  ? Pulmonary emboli ([place]) 06/08/2019  ? Chronic heart failure with preserved ejection fraction ([place]) 05/30/2019  ? Class 2 severe obesity due to excess calories with serious comorbidity and body mass index (BMI) of 39.0 to 39.9 in adult [facility]) 05/30/2019  ? Morbid obesity with BMI of 40.0-44.9, adult ([place]) 05/30/2019  ? Hyponatremia   ? Drug induced constipation   ? Peripheral edema   ? Hypotension due to drugs   ? Hypoalbuminemia due to protein-calorie malnutrition ([place])   ? Hypokalemia   ? Thrombocytopenia ([place])   ? Anemia of chronic disease   ? Cirrhosis of liver without ascites ([place])   ? Chronic pain syndrome   ? Debility 03/13/2019  ? Portal hypertensive gastropathy ([place]) 03/06/2019  ? Dyspnea   ? GIB (gastrointestinal bleeding)  03/04/2019  ? Mixed hyperlipidemia 01/24/2019  ? Insomnia 01/24/2019  ? Hyperlipidemia 01/24/2019  ? Essential hypertension 01/24/2019  ? Lumbar disc herniation 01/24/2019  ? ? ?Current Outpatient Medications:  ?  acetaminophen (TYLENOL) 500 MG tablet, Take 500-1,000 mg by mouth every 6 (six) hours as needed for mild pain, moderate pain or headache., Disp: , Rfl:  ?   apixaban (ELIQUIS) 5 MG TABS tablet, Take 1 tablet (5 mg total) by mouth 2 (two) times daily., Disp: 60 tablet, Rfl: 0 ?  Buprenorphine HCl-Naloxone HCl 8-2 MG FILM, Place 1 tablet under the tongue 2 (two) times daily., Disp: , Rfl:  ?  eplerenone (INSPRA) 50 MG tablet, TAKE 2 TABLETS BY MOUTH DAILY (Patient taking differently: Take 100 mg by mouth daily.), Disp: 60 tablet, Rfl: 3 ?  gabapentin (NEURONTIN) 800 MG tablet, Take 1 tablet (800 mg total) by mouth 3 (three) times daily., Disp: 90 tablet, Rfl: 1 ?  lactulose (CHRONULAC) 10 GM/15ML solution, Take 15 mLs (10 g total) by mouth 3 (three) times daily., Disp: 946 mL, Rfl: 1 ?  metolazone (ZAROXOLYN) 2.5 MG tablet, Take 1 tablet (2.5 mg total) by mouth 3 (three) times a week. Monday, Wednesday and Fridays (Patient taking differently: Take 2.5 mg by mouth every Monday, Wednesday, and Friday.), Disp: 14 tablet, Rfl: 0 ?  metoprolol succinate (TOPROL XL) 25 MG 24 hr tablet, Take 1 tablet (25 mg total) by mouth at bedtime., Disp: 30 tablet, Rfl: 2 ?  ondansetron (ZOFRAN-ODT) 4 MG disintegrating tablet, TAKE 1 TABLET BY MOUTH EVERY 8 HOURS AS NEEDED FOR NAUSEA OR VOMITING, Disp: 20 tablet, Rfl: 2 ?  pantoprazole (PROTONIX) 40 MG tablet, Take 40 mg by mouth 2 (two) times daily before a meal., Disp: , Rfl:  ?  polyethylene glycol (MIRALAX / GLYCOLAX) 17 g packet, Take 17 g by mouth as needed for mild constipation (mix and drink as directed)., Disp: , Rfl:  ?  potassium chloride SA (KLOR-CON M) 20 MEQ tablet, TAKE 5 TABLETS BY MOUTH TWICE DAILY. EXTRA 40 MEQ WITH METOLAZONE DAYS., Disp: 260 tablet, Rfl: 0 ?  pravastatin (PRAVACHOL) 20 MG tablet, Take 1 tablet (20 mg total) by mouth daily. (Patient taking differently: Take 20 mg by mouth at bedtime.), Disp: 30 tablet, Rfl: 1 ?  sertraline (ZOLOFT) 50 MG tablet, TAKE 1 TABLET BY MOUTH DAILY, Disp: 30 tablet, Rfl: 3 ?  sodium chloride (OCEAN) 0.65 % SOLN nasal spray, Place 1 spray into both nostrils as needed for  congestion., Disp: , Rfl:  ?  tamsulosin (FLOMAX) 0.4 MG CAPS capsule, Take 1 capsule (0.4 mg total) by mouth daily., Disp: 30 capsule, Rfl: 0 ?  torsemide (DEMADEX) 20 MG tablet, Take 5 tablets (100 mg total) by mouth See admin instructions. Take 100 mg by mouth in the morning and evening, Disp: 280 tablet, Rfl: 11 ?  triamcinolone (KENALOG) 0.025 % ointment, Apply 1 application topically 2 (two) times daily. (Patient taking differently: Apply 1 application. topically 2 (two) times daily as needed (as directed- to affected sites).), Disp: 30 g, Rfl: 3 ?  VENTOLIN HFA 108 (90 Base) MCG/ACT inhaler, Inhale 1 puff into the lungs every 4 (four) hours as needed for shortness of breath. (Patient taking differently: Inhale 2 puffs into the lungs every 4 (four) hours as needed for shortness of breath.), Disp: 6.7 g, Rfl: 1 ?  zolpidem (AMBIEN) 10 MG tablet, Take 1 tablet (10 mg total) by mouth at bedtime as needed for sleep. (Patient taking differently: Take  10 mg by mouth at bedtime.), Disp: 30 tablet, Rfl: 0 ?Allergies  ?Allergen Reactions  ? Penicillins Other (See Comments)  ?  Caused convulsions as a child  ?Did it involve swelling of the face/tongue/throat, SOB, or low BP? N/A ?Did it involve sudden or severe rash/hives, skin peeling, or any reaction on the inside of your mouth or nose? N/A ?Did you need to seek medical attention at a hospital or doctor's office?N/A ?When did it last happen? N/A ?If all above answers are "NO", may proceed with cephalosporin use.  ? ? ? ?Social History  ? ?Socioeconomic History  ? Marital status: Single  ?  Spouse name: Not on file  ? Number of children: Not on file  ? Years of education: Not on file  ? Highest education level: Not on file  ?Occupational History  ? Not on file  ?Tobacco Use  ? Smoking status: Never  ? Smokeless tobacco: Never  ?Vaping Use  ? Vaping Use: Never used  ?Substance and Sexual Activity  ? Alcohol use: Not Currently  ? Drug use: Not Currently  ?  Types:  Marijuana  ?  Comment: Smoked for 30 years  ? Sexual activity: Not on file  ?Other Topics Concern  ? Not on file  ?Social History Narrative  ? Not on file  ? ?Social Determinants of Health  ? ?Financial Resource Strain: Med

## 2021-07-07 ENCOUNTER — Emergency Department (HOSPITAL_COMMUNITY): Payer: Medicare HMO

## 2021-07-07 ENCOUNTER — Other Ambulatory Visit: Payer: Self-pay

## 2021-07-07 ENCOUNTER — Encounter (HOSPITAL_COMMUNITY): Payer: Self-pay | Admitting: Emergency Medicine

## 2021-07-07 ENCOUNTER — Inpatient Hospital Stay (HOSPITAL_COMMUNITY)
Admission: EM | Admit: 2021-07-07 | Discharge: 2021-07-17 | DRG: 640 | Disposition: A | Discharge status: Home Health | Payer: Medicare HMO | Attending: Internal Medicine | Admitting: Internal Medicine

## 2021-07-07 DIAGNOSIS — Z79899 Other long term (current) drug therapy: Secondary | ICD-10-CM

## 2021-07-07 DIAGNOSIS — I10 Essential (primary) hypertension: Secondary | ICD-10-CM | POA: Diagnosis present

## 2021-07-07 DIAGNOSIS — E876 Hypokalemia: Principal | ICD-10-CM | POA: Diagnosis present

## 2021-07-07 DIAGNOSIS — K5909 Other constipation: Secondary | ICD-10-CM | POA: Diagnosis present

## 2021-07-07 DIAGNOSIS — I2510 Atherosclerotic heart disease of native coronary artery without angina pectoris: Secondary | ICD-10-CM | POA: Diagnosis present

## 2021-07-07 DIAGNOSIS — D638 Anemia in other chronic diseases classified elsewhere: Secondary | ICD-10-CM | POA: Diagnosis present

## 2021-07-07 DIAGNOSIS — N400 Enlarged prostate without lower urinary tract symptoms: Secondary | ICD-10-CM | POA: Diagnosis present

## 2021-07-07 DIAGNOSIS — F419 Anxiety disorder, unspecified: Secondary | ICD-10-CM | POA: Diagnosis present

## 2021-07-07 DIAGNOSIS — R079 Chest pain, unspecified: Secondary | ICD-10-CM

## 2021-07-07 DIAGNOSIS — N179 Acute kidney failure, unspecified: Secondary | ICD-10-CM | POA: Diagnosis present

## 2021-07-07 DIAGNOSIS — G4700 Insomnia, unspecified: Secondary | ICD-10-CM | POA: Diagnosis present

## 2021-07-07 DIAGNOSIS — Z86718 Personal history of other venous thrombosis and embolism: Secondary | ICD-10-CM

## 2021-07-07 DIAGNOSIS — R600 Localized edema: Principal | ICD-10-CM

## 2021-07-07 DIAGNOSIS — I480 Paroxysmal atrial fibrillation: Secondary | ICD-10-CM | POA: Diagnosis present

## 2021-07-07 DIAGNOSIS — Z8249 Family history of ischemic heart disease and other diseases of the circulatory system: Secondary | ICD-10-CM

## 2021-07-07 DIAGNOSIS — I2699 Other pulmonary embolism without acute cor pulmonale: Secondary | ICD-10-CM | POA: Diagnosis not present

## 2021-07-07 DIAGNOSIS — Z6841 Body Mass Index (BMI) 40.0 and over, adult: Secondary | ICD-10-CM

## 2021-07-07 DIAGNOSIS — Z823 Family history of stroke: Secondary | ICD-10-CM

## 2021-07-07 DIAGNOSIS — E785 Hyperlipidemia, unspecified: Secondary | ICD-10-CM | POA: Diagnosis present

## 2021-07-07 DIAGNOSIS — I878 Other specified disorders of veins: Secondary | ICD-10-CM | POA: Diagnosis present

## 2021-07-07 DIAGNOSIS — I890 Lymphedema, not elsewhere classified: Secondary | ICD-10-CM

## 2021-07-07 DIAGNOSIS — Z20822 Contact with and (suspected) exposure to covid-19: Secondary | ICD-10-CM | POA: Diagnosis present

## 2021-07-07 DIAGNOSIS — I872 Venous insufficiency (chronic) (peripheral): Secondary | ICD-10-CM

## 2021-07-07 DIAGNOSIS — G4733 Obstructive sleep apnea (adult) (pediatric): Secondary | ICD-10-CM | POA: Diagnosis present

## 2021-07-07 DIAGNOSIS — I5033 Acute on chronic diastolic (congestive) heart failure: Secondary | ICD-10-CM | POA: Diagnosis present

## 2021-07-07 DIAGNOSIS — I5032 Chronic diastolic (congestive) heart failure: Secondary | ICD-10-CM | POA: Diagnosis present

## 2021-07-07 DIAGNOSIS — E871 Hypo-osmolality and hyponatremia: Secondary | ICD-10-CM | POA: Diagnosis present

## 2021-07-07 DIAGNOSIS — Z880 Allergy status to penicillin: Secondary | ICD-10-CM

## 2021-07-07 DIAGNOSIS — J449 Chronic obstructive pulmonary disease, unspecified: Secondary | ICD-10-CM | POA: Diagnosis present

## 2021-07-07 DIAGNOSIS — J441 Chronic obstructive pulmonary disease with (acute) exacerbation: Secondary | ICD-10-CM | POA: Diagnosis present

## 2021-07-07 DIAGNOSIS — K219 Gastro-esophageal reflux disease without esophagitis: Secondary | ICD-10-CM | POA: Diagnosis present

## 2021-07-07 DIAGNOSIS — F32A Depression, unspecified: Secondary | ICD-10-CM | POA: Diagnosis present

## 2021-07-07 DIAGNOSIS — K7460 Unspecified cirrhosis of liver: Secondary | ICD-10-CM | POA: Diagnosis present

## 2021-07-07 DIAGNOSIS — D509 Iron deficiency anemia, unspecified: Secondary | ICD-10-CM | POA: Diagnosis present

## 2021-07-07 DIAGNOSIS — Z83438 Family history of other disorder of lipoprotein metabolism and other lipidemia: Secondary | ICD-10-CM

## 2021-07-07 DIAGNOSIS — Z7901 Long term (current) use of anticoagulants: Secondary | ICD-10-CM

## 2021-07-07 DIAGNOSIS — G894 Chronic pain syndrome: Secondary | ICD-10-CM | POA: Diagnosis present

## 2021-07-07 DIAGNOSIS — I110 Hypertensive heart disease with heart failure: Secondary | ICD-10-CM | POA: Diagnosis present

## 2021-07-07 DIAGNOSIS — E782 Mixed hyperlipidemia: Secondary | ICD-10-CM | POA: Diagnosis present

## 2021-07-07 DIAGNOSIS — I444 Left anterior fascicular block: Secondary | ICD-10-CM | POA: Diagnosis present

## 2021-07-07 LAB — CBC WITH DIFFERENTIAL/PLATELET
Abs Immature Granulocytes: 0.04 10*3/uL (ref 0.00–0.07)
Basophils Absolute: 0 10*3/uL (ref 0.0–0.1)
Basophils Relative: 0 %
Eosinophils Absolute: 0.1 10*3/uL (ref 0.0–0.5)
Eosinophils Relative: 1 %
HCT: 34.4 % — ABNORMAL LOW (ref 39.0–52.0)
Hemoglobin: 10.1 g/dL — ABNORMAL LOW (ref 13.0–17.0)
Immature Granulocytes: 0 %
Lymphocytes Relative: 10 %
Lymphs Abs: 0.9 10*3/uL (ref 0.7–4.0)
MCH: 21.9 pg — ABNORMAL LOW (ref 26.0–34.0)
MCHC: 29.4 g/dL — ABNORMAL LOW (ref 30.0–36.0)
MCV: 74.6 fL — ABNORMAL LOW (ref 80.0–100.0)
Monocytes Absolute: 0.6 10*3/uL (ref 0.1–1.0)
Monocytes Relative: 7 %
Neutro Abs: 7.6 10*3/uL (ref 1.7–7.7)
Neutrophils Relative %: 82 %
Platelets: 179 10*3/uL (ref 150–400)
RBC: 4.61 MIL/uL (ref 4.22–5.81)
RDW: 17.6 % — ABNORMAL HIGH (ref 11.5–15.5)
WBC: 9.3 10*3/uL (ref 4.0–10.5)
nRBC: 0 % (ref 0.0–0.2)

## 2021-07-07 LAB — COMPREHENSIVE METABOLIC PANEL
ALT: 18 U/L (ref 0–44)
AST: 34 U/L (ref 15–41)
Albumin: 3.2 g/dL — ABNORMAL LOW (ref 3.5–5.0)
Alkaline Phosphatase: 79 U/L (ref 38–126)
Anion gap: 10 (ref 5–15)
BUN: 10 mg/dL (ref 8–23)
CO2: 30 mmol/L (ref 22–32)
Calcium: 8.8 mg/dL — ABNORMAL LOW (ref 8.9–10.3)
Chloride: 98 mmol/L (ref 98–111)
Creatinine, Ser: 1.1 mg/dL (ref 0.61–1.24)
GFR, Estimated: >60 mL/min (ref >60)
Glucose, Bld: 120 mg/dL — ABNORMAL HIGH (ref 70–99)
Potassium: 3.4 mmol/L — ABNORMAL LOW (ref 3.5–5.1)
Sodium: 138 mmol/L (ref 135–145)
Total Bilirubin: 1.1 mg/dL (ref 0.3–1.2)
Total Protein: 7.5 g/dL (ref 6.5–8.1)

## 2021-07-07 LAB — URINALYSIS, ROUTINE W REFLEX MICROSCOPIC
Bilirubin Urine: NEGATIVE
Glucose, UA: NEGATIVE mg/dL
Hgb urine dipstick: NEGATIVE
Ketones, ur: NEGATIVE mg/dL
Leukocytes,Ua: NEGATIVE
Nitrite: NEGATIVE
Protein, ur: NEGATIVE mg/dL
Specific Gravity, Urine: 1.008 (ref 1.005–1.030)
pH: 8 (ref 5.0–8.0)

## 2021-07-07 LAB — LACTIC ACID, PLASMA
Lactic Acid, Venous: 2.3 mmol/L (ref 0.5–1.9)
Lactic Acid, Venous: 1.8 mmol/L (ref 0.5–1.9)

## 2021-07-07 LAB — TROPONIN I (HIGH SENSITIVITY): Troponin I (High Sensitivity): 7 ng/L (ref <18)

## 2021-07-07 LAB — AMMONIA: Ammonia: 38 umol/L — ABNORMAL HIGH (ref 9–35)

## 2021-07-07 LAB — RESP PANEL BY RT-PCR (FLU A&B, COVID) ARPGX2
Influenza A by PCR: NEGATIVE
Influenza B by PCR: NEGATIVE
SARS Coronavirus 2 by RT PCR: NEGATIVE

## 2021-07-07 LAB — BRAIN NATRIURETIC PEPTIDE: B Natriuretic Peptide: 79.9 pg/mL (ref 0.0–100.0)

## 2021-07-07 LAB — MAGNESIUM: Magnesium: 1.8 mg/dL (ref 1.7–2.4)

## 2021-07-07 MED ORDER — PRAVASTATIN SODIUM 10 MG PO TABS
20.0000 mg | ORAL_TABLET | Freq: Every day | ORAL | Status: DC
  Administered 2021-07-08 – 2021-07-16 (×10): 20 mg via ORAL
  Filled 2021-07-07 (×11): qty 2

## 2021-07-07 MED ORDER — FUROSEMIDE 10 MG/ML IJ SOLN
60.0000 mg | Freq: Once | INTRAMUSCULAR | Status: AC
  Administered 2021-07-07: 60 mg via INTRAVENOUS
  Filled 2021-07-07: qty 6

## 2021-07-07 MED ORDER — GABAPENTIN 400 MG PO CAPS
800.0000 mg | ORAL_CAPSULE | Freq: Three times a day (TID) | ORAL | Status: DC
  Administered 2021-07-08 – 2021-07-17 (×28): 800 mg via ORAL
  Filled 2021-07-07 (×31): qty 2

## 2021-07-07 MED ORDER — LACTATED RINGERS IV SOLN: INTRAVENOUS | Status: DC

## 2021-07-07 MED ORDER — MAGNESIUM SULFATE IN D5W 1-5 GM/100ML-% IV SOLN
1.0000 g | Freq: Once | INTRAVENOUS | Status: AC
  Administered 2021-07-07: 1 g via INTRAVENOUS
  Filled 2021-07-07: qty 100

## 2021-07-07 MED ORDER — TAMSULOSIN HCL 0.4 MG PO CAPS
0.4000 mg | ORAL_CAPSULE | Freq: Every day | ORAL | Status: DC
  Administered 2021-07-08 – 2021-07-17 (×10): 0.4 mg via ORAL
  Filled 2021-07-07 (×10): qty 1

## 2021-07-07 MED ORDER — APIXABAN 5 MG PO TABS
5.0000 mg | ORAL_TABLET | Freq: Two times a day (BID) | ORAL | Status: DC
  Administered 2021-07-08: 5 mg via ORAL
  Filled 2021-07-07: qty 1

## 2021-07-07 MED ORDER — PANTOPRAZOLE SODIUM 40 MG PO TBEC
40.0000 mg | DELAYED_RELEASE_TABLET | Freq: Two times a day (BID) | ORAL | Status: DC
  Administered 2021-07-08 – 2021-07-17 (×19): 40 mg via ORAL
  Filled 2021-07-07 (×19): qty 1

## 2021-07-07 MED ORDER — LACTATED RINGERS IV BOLUS
500.0000 mL | Freq: Once | INTRAVENOUS | Status: AC
  Administered 2021-07-07: 500 mL via INTRAVENOUS

## 2021-07-07 MED ORDER — POTASSIUM CHLORIDE 20 MEQ PO PACK
40.0000 meq | PACK | Freq: Once | ORAL | Status: AC
Start: 2021-07-07 — End: 2021-07-07
  Administered 2021-07-07: 40 meq via ORAL
  Filled 2021-07-07: qty 2

## 2021-07-07 MED ORDER — SERTRALINE HCL 50 MG PO TABS
50.0000 mg | ORAL_TABLET | Freq: Every day | ORAL | Status: DC
  Administered 2021-07-08 – 2021-07-17 (×10): 50 mg via ORAL
  Filled 2021-07-07 (×10): qty 1

## 2021-07-07 MED ORDER — BUPRENORPHINE HCL-NALOXONE HCL 8-2 MG SL SUBL: 1.0000 | SUBLINGUAL_TABLET | Freq: Every day | SUBLINGUAL | Status: DC

## 2021-07-07 MED ORDER — METOPROLOL SUCCINATE ER 25 MG PO TB24
25.0000 mg | ORAL_TABLET | Freq: Every day | ORAL | Status: DC
  Administered 2021-07-08 – 2021-07-16 (×10): 25 mg via ORAL
  Filled 2021-07-07 (×11): qty 1

## 2021-07-07 MED ORDER — LACTULOSE 10 GM/15ML PO SOLN
10.0000 g | Freq: Three times a day (TID) | ORAL | Status: DC
  Administered 2021-07-08 – 2021-07-16 (×24): 10 g via ORAL
  Filled 2021-07-07 (×3): qty 15
  Filled 2021-07-07: qty 30
  Filled 2021-07-07 (×2): qty 15
  Filled 2021-07-07: qty 30
  Filled 2021-07-07 (×17): qty 15

## 2021-07-07 NOTE — ED Provider Notes (Addendum)
?[facility] ?Provider Note ? ? ?CSN: [id number] ?Arrival date & time: 07/07/21  1531 ? ?  ? ?History ? ?Chief Complaint  ?Patient presents with  ? Cellulitis  ?  Bilateral lower extremities   ? ? ?Charles Gray is a 66 y.o. male. ? ?HPI ?Patient presenting due to concerns worsened bilateral leg swelling.  He currently lives alone.  He has a home nurse that is there 3 hours every day.  He states that those who know him are concerned about his recent confusion and decreased mobility.  He states his mobility is limited by pain and swelling in his legs.  He does walk with a walker.  Medical history includes chronic pain, paroxysmal atrial fibrillation, history of DVT and PE, venous stasis, HLD, thrombocytopenia, anemia, cirrhosis, CHF, obesity, COPD and history of GIB.  He continues to take Eliquis.  He also states that he has been on doxycycline for treatment of BLE cellulitis.  Despite this, redness, pain, and swelling has increased. ? ?History per his home nurse: She has known for 2 years.  He has had recently increased swelling in his bilateral lower extremities.  She has also noticed swelling in his abdomen.  Patient also has had worsened exercise intolerance due to exertional shortness of breath.  He had new onset of nausea today.  He has also had ongoing depression.  He has been compliant with his home medications which include 100 mg of torsemide twice daily, 2.5 mg of metolazone 3 times per week, and Eliquis.  He has been on antibiotics for presumed cellulitis of his lower extremities for the past week.  Patient's home nurse is concerned about volume overload. ?  ? ?Home Medications ?Prior to Admission medications   ?Medication Sig Start Date End Date Taking? Authorizing Provider  ?acetaminophen (TYLENOL) 500 MG tablet Take 500-1,000 mg by mouth every 6 (six) hours as needed for mild pain, moderate pain or headache.    [provider]  ?apixaban (ELIQUIS) 5 MG  TABS tablet Take 1 tablet (5 mg total) by mouth 2 (two) times daily. 04/11/21   Scarlett Presto, MD  ?Buprenorphine HCl-Naloxone HCl 8-2 MG FILM Place 1 tablet under the tongue 2 (two) times daily.    [provider]  ?eplerenone (INSPRA) 50 MG tablet TAKE 2 TABLETS BY MOUTH DAILY ?Patient taking differently: Take 100 mg by mouth daily. 03/26/21   Larey Dresser, MD  ?gabapentin (NEURONTIN) 800 MG tablet Take 1 tablet (800 mg total) by mouth 3 (three) times daily. 03/23/19   Angiulli, Lavon Paganini, PA-C  ?lactulose (CHRONULAC) 10 GM/15ML solution Take 15 mLs (10 g total) by mouth 3 (three) times daily. 12/23/20   Shelly Coss, MD  ?metolazone (ZAROXOLYN) 2.5 MG tablet Take 1 tablet (2.5 mg total) by mouth 3 (three) times a week. Monday, Wednesday and Fridays ?Patient taking differently: Take 2.5 mg by mouth every Monday, Wednesday, and Friday. 11/15/20   Joette Catching, PA-C  ?metoprolol succinate (TOPROL XL) 25 MG 24 hr tablet Take 1 tablet (25 mg total) by mouth at bedtime. 05/13/21 08/11/21  Larey Dresser, MD  ?ondansetron (ZOFRAN-ODT) 4 MG disintegrating tablet TAKE 1 TABLET BY MOUTH EVERY 8 HOURS AS NEEDED FOR NAUSEA OR VOMITING 01/07/21   Larey Dresser, MD  ?pantoprazole (PROTONIX) 40 MG tablet Take 40 mg by mouth 2 (two) times daily before a meal.    [provider]  ?polyethylene glycol (MIRALAX / GLYCOLAX) 17 g packet Take 17 g  by mouth as needed for mild constipation (mix and drink as directed).    [provider]  ?potassium chloride SA (KLOR-CON M) 20 MEQ tablet TAKE 5 TABLETS BY MOUTH TWICE DAILY. EXTRA 40 MEQ WITH METOLAZONE DAYS. 07/02/21   Larey Dresser, MD  ?pravastatin (PRAVACHOL) 20 MG tablet Take 1 tablet (20 mg total) by mouth daily. ?Patient taking differently: Take 20 mg by mouth at bedtime. 11/15/20   Joette Catching, PA-C  ?sertraline (ZOLOFT) 50 MG tablet TAKE 1 TABLET BY MOUTH DAILY 05/14/21   Rafael Bihari, FNP  ?sodium chloride (OCEAN) 0.65 % SOLN  nasal spray Place 1 spray into both nostrils as needed for congestion.    [provider]  ?tamsulosin (FLOMAX) 0.4 MG CAPS capsule Take 1 capsule (0.4 mg total) by mouth daily. 11/15/20   Joette Catching, PA-C  ?torsemide (DEMADEX) 20 MG tablet Take 5 tablets (100 mg total) by mouth See admin instructions. Take 100 mg by mouth in the morning and evening 05/13/21   Larey Dresser, MD  ?triamcinolone (KENALOG) 0.025 % ointment Apply 1 application topically 2 (two) times daily. ?Patient taking differently: Apply 1 application. topically 2 (two) times daily as needed (as directed- to affected sites). 02/06/21   Vu, Rockey Situ, MD  ?VENTOLIN HFA 108 (90 Base) MCG/ACT inhaler Inhale 1 puff into the lungs every 4 (four) hours as needed for shortness of breath. ?Patient taking differently: Inhale 2 puffs into the lungs every 4 (four) hours as needed for shortness of breath. 03/23/19   Angiulli, Lavon Paganini, PA-C  ?zolpidem (AMBIEN) 10 MG tablet Take 1 tablet (10 mg total) by mouth at bedtime as needed for sleep. ?Patient taking differently: Take 10 mg by mouth at bedtime. 12/23/20   Shelly Coss, MD  ?bumetanide (BUMEX) 1 MG tablet Take 4 tablets (4 mg total) by mouth daily with breakfast AND 3 tablets (3 mg total) every evening. 05/10/20 06/13/20  Larey Dresser, MD  ?metoprolol tartrate (LOPRESSOR) 50 MG tablet Take 1 tablet (50 mg total) by mouth 2 (two) times daily. 02/09/20 04/11/20  Mercy Riding, MD  ?   ? ?Allergies    ?Penicillins   ? ?Review of Systems   ?Review of Systems  ?Constitutional:  Positive for activity change and fatigue.  ?Respiratory:  Positive for shortness of breath (Exertional).   ?Cardiovascular:  Positive for leg swelling.  ?Gastrointestinal:  Positive for abdominal distention and nausea.  ?Hematological:  Bruises/bleeds easily (On Eliquis).  ?Psychiatric/Behavioral:  Positive for confusion and dysphoric mood.   ? ?Physical Exam ?Updated Vital Signs ?BP (!) 149/91   Pulse 81   Temp  98.7 ?F (37.1 ?C) (Oral)   Resp 19   SpO2 99%  ?Physical Exam ?Vitals and nursing note reviewed.  ?Constitutional:   ?   General: He is not in acute distress. ?   Appearance: He is well-developed. He is not toxic-appearing or diaphoretic.  ?HENT:  ?   Head: Normocephalic and atraumatic.  ?   Right Ear: External ear normal.  ?   Left Ear: External ear normal.  ?   Nose: Nose normal.  ?   Mouth/Throat:  ?   Mouth: Mucous membranes are dry.  ?   Pharynx: Oropharynx is clear.  ?Eyes:  ?   Extraocular Movements: Extraocular movements intact.  ?   Conjunctiva/sclera: Conjunctivae normal.  ?Cardiovascular:  ?   Rate and Rhythm: Normal rate and regular rhythm.  ?   Heart sounds: No murmur  heard. ?Pulmonary:  ?   Effort: Pulmonary effort is normal. No respiratory distress.  ?   Breath sounds: Normal breath sounds. No wheezing or rales.  ?Abdominal:  ?   Palpations: Abdomen is soft.  ?   Tenderness: There is no abdominal tenderness.  ?Musculoskeletal:     ?   General: Tenderness present. No deformity.  ?   Cervical back: Normal range of motion and neck supple.  ?   Right lower leg: Edema present.  ?   Left lower leg: Edema present.  ?Skin: ?   General: Skin is warm and dry.  ?   Capillary Refill: Capillary refill takes less than 2 seconds.  ?   Findings: Erythema present.  ?Neurological:  ?   General: No focal deficit present.  ?   Mental Status: He is alert and oriented to person, place, and time.  ?   Cranial Nerves: No cranial nerve deficit.  ?   Sensory: No sensory deficit.  ?   Motor: No weakness.  ?   Coordination: Coordination normal.  ?Psychiatric:     ?   Mood and Affect: Affect is flat.     ?   Speech: Speech is delayed.     ?   Behavior: Behavior is slowed. Behavior is cooperative.  ? ? ? ?ED Results / Procedures / Treatments   ?Labs ?(all labs ordered are listed, but only abnormal results are displayed) ?Labs Reviewed  ?LACTIC ACID, PLASMA - Abnormal; Notable for the following components:  ?    Result Value  ?  Lactic Acid, Venous 2.3 (*)   ? All other components within normal limits  ?COMPREHENSIVE METABOLIC PANEL - Abnormal; Notable for the following components:  ? Potassium 3.4 (*)   ? Glucose, Bld 120 (*)   ? Ca

## 2021-07-07 NOTE — ED Provider Triage Note (Signed)
Emergency Medicine Provider Triage Evaluation Note ? ?Charles Gray , a 66 y.o. male  was evaluated in triage.  Pt complains of worsening erythema to bilateral lower extremities, generalized abdominal pain and "feeling unwell."  Patient reports that he has been on doxycycline for the last 5 days due to bilateral lower leg cellulitis.  Medication was prescribed by his PCP.  Patient states that erythema has gotten worse over this time.  Patient reports that he has developed generalized abdominal pain the last few days.  Patient reports not feeling well. ? ?Review of Systems  ?Positive: Fever, chills, nausea, color change, leg swelling ?Negative: Vomiting, diarrhea, dysuria, hematuria, urinary urgency ? ?Physical Exam  ?BP 136/72   Pulse 89   Temp 98.7 ?F (37.1 ?C) (Oral)   Resp 16   SpO2 96%  ?Gen:   Awake, no distress   ?Resp:  Normal effort  ?MSK:   Moves extremities without difficulty; pitting edema to bilateral lower extremities.  Patient has erythema and warmth to bilateral lower extremities.  Swelling extends to patient's feet bilaterally. ?Other:  Abdomen soft, nondistended, mild tenderness generalized throughout abdomen. ? ?Medical Decision Making  ?Medically screening exam initiated at 4:02 PM.  Appropriate orders placed.  Charles Gray was informed that the remainder of the evaluation will be completed by another provider, this initial triage assessment does not replace that evaluation, and the importance of remaining in the ED until their evaluation is complete. ? ? ?  ?Loni Beckwith, PA-C ?07/07/21 1604 ? ?

## 2021-07-07 NOTE — ED Notes (Signed)
RN placed urinal at bedside  ?

## 2021-07-07 NOTE — ED Triage Notes (Signed)
Patient BIB [facility] from home, complaint of cellulitis bilateral lower extremities that is not improving with oral antibiotics. Home health staff worried about pt mentation, but pt A&Ox4.  ?

## 2021-07-08 ENCOUNTER — Observation Stay (HOSPITAL_BASED_OUTPATIENT_CLINIC_OR_DEPARTMENT_OTHER): Payer: Medicare HMO

## 2021-07-08 ENCOUNTER — Emergency Department (HOSPITAL_COMMUNITY): Payer: Medicare HMO

## 2021-07-08 ENCOUNTER — Observation Stay: Payer: Self-pay

## 2021-07-08 DIAGNOSIS — R609 Edema, unspecified: Secondary | ICD-10-CM

## 2021-07-08 DIAGNOSIS — I5032 Chronic diastolic (congestive) heart failure: Secondary | ICD-10-CM

## 2021-07-08 DIAGNOSIS — L538 Other specified erythematous conditions: Secondary | ICD-10-CM

## 2021-07-08 DIAGNOSIS — J441 Chronic obstructive pulmonary disease with (acute) exacerbation: Secondary | ICD-10-CM

## 2021-07-08 DIAGNOSIS — I10 Essential (primary) hypertension: Secondary | ICD-10-CM

## 2021-07-08 DIAGNOSIS — I5033 Acute on chronic diastolic (congestive) heart failure: Secondary | ICD-10-CM | POA: Diagnosis not present

## 2021-07-08 DIAGNOSIS — I480 Paroxysmal atrial fibrillation: Secondary | ICD-10-CM

## 2021-07-08 DIAGNOSIS — K219 Gastro-esophageal reflux disease without esophagitis: Secondary | ICD-10-CM

## 2021-07-08 DIAGNOSIS — I2699 Other pulmonary embolism without acute cor pulmonale: Secondary | ICD-10-CM | POA: Diagnosis not present

## 2021-07-08 DIAGNOSIS — I890 Lymphedema, not elsewhere classified: Secondary | ICD-10-CM

## 2021-07-08 LAB — CBC
HCT: 36.3 % — ABNORMAL LOW (ref 39.0–52.0)
Hemoglobin: 10.2 g/dL — ABNORMAL LOW (ref 13.0–17.0)
MCH: 21.5 pg — ABNORMAL LOW (ref 26.0–34.0)
MCHC: 28.1 g/dL — ABNORMAL LOW (ref 30.0–36.0)
MCV: 76.4 fL — ABNORMAL LOW (ref 80.0–100.0)
Platelets: 162 10*3/uL (ref 150–400)
RBC: 4.75 MIL/uL (ref 4.22–5.81)
RDW: 17.9 % — ABNORMAL HIGH (ref 11.5–15.5)
WBC: 7.4 10*3/uL (ref 4.0–10.5)
nRBC: 0 % (ref 0.0–0.2)

## 2021-07-08 LAB — TROPONIN I (HIGH SENSITIVITY): Troponin I (High Sensitivity): 6 ng/L (ref <18)

## 2021-07-08 LAB — APTT: aPTT: 66 seconds — ABNORMAL HIGH (ref 24–36)

## 2021-07-08 LAB — HEPARIN LEVEL (UNFRACTIONATED): Heparin Unfractionated: >1.1 IU/mL — ABNORMAL HIGH (ref 0.30–0.70)

## 2021-07-08 LAB — HIV ANTIBODY (ROUTINE TESTING W REFLEX): HIV Screen 4th Generation wRfx: NONREACTIVE

## 2021-07-08 MED ORDER — SODIUM CHLORIDE 0.9% FLUSH
10.0000 mL | Freq: Two times a day (BID) | INTRAVENOUS | Status: DC
  Administered 2021-07-09 – 2021-07-17 (×16): 10 mL

## 2021-07-08 MED ORDER — IPRATROPIUM-ALBUTEROL 0.5-2.5 (3) MG/3ML IN SOLN: 3.0000 mL | RESPIRATORY_TRACT | Status: DC | PRN

## 2021-07-08 MED ORDER — ONDANSETRON HCL 4 MG/2ML IJ SOLN
4.0000 mg | Freq: Once | INTRAMUSCULAR | Status: AC
  Administered 2021-07-08: 4 mg via INTRAVENOUS
  Filled 2021-07-08: qty 2

## 2021-07-08 MED ORDER — HEPARIN (PORCINE) 25000 UT/250ML-% IV SOLN
2300.0000 [IU]/h | INTRAVENOUS | Status: AC
  Administered 2021-07-08 – 2021-07-09 (×2): 2400 [IU]/h via INTRAVENOUS
  Filled 2021-07-08 (×6): qty 250

## 2021-07-08 MED ORDER — SODIUM CHLORIDE 0.9% FLUSH: 10.0000 mL | INTRAVENOUS | Status: DC | PRN

## 2021-07-08 MED ORDER — MORPHINE SULFATE (PF) 4 MG/ML IV SOLN
4.0000 mg | Freq: Once | INTRAVENOUS | Status: AC
  Administered 2021-07-08: 4 mg via INTRAVENOUS
  Filled 2021-07-08: qty 1

## 2021-07-08 MED ORDER — IOHEXOL 350 MG/ML SOLN
60.0000 mL | Freq: Once | INTRAVENOUS | Status: AC | PRN
  Administered 2021-07-08: 60 mL via INTRAVENOUS

## 2021-07-08 MED ORDER — FUROSEMIDE 10 MG/ML IJ SOLN
80.0000 mg | Freq: Two times a day (BID) | INTRAMUSCULAR | Status: DC
  Administered 2021-07-08 – 2021-07-11 (×6): 80 mg via INTRAVENOUS
  Filled 2021-07-08 (×6): qty 8

## 2021-07-08 MED ORDER — ONDANSETRON HCL 4 MG/2ML IJ SOLN
4.0000 mg | Freq: Four times a day (QID) | INTRAMUSCULAR | Status: DC | PRN
Start: 2021-07-08 — End: 2021-07-18
  Administered 2021-07-12: 4 mg via INTRAVENOUS
  Filled 2021-07-08: qty 2

## 2021-07-08 MED ORDER — ACETAMINOPHEN 325 MG PO TABS: 650.0000 mg | ORAL_TABLET | Freq: Four times a day (QID) | ORAL | Status: DC | PRN

## 2021-07-08 MED ORDER — ZOLPIDEM TARTRATE 5 MG PO TABS
10.0000 mg | ORAL_TABLET | Freq: Every evening | ORAL | Status: DC | PRN
  Administered 2021-07-08 – 2021-07-16 (×9): 10 mg via ORAL
  Filled 2021-07-08 (×9): qty 2

## 2021-07-08 MED ORDER — POLYETHYLENE GLYCOL 3350 17 G PO PACK
17.0000 g | PACK | Freq: Every day | ORAL | Status: DC | PRN
  Administered 2021-07-09 – 2021-07-12 (×2): 17 g via ORAL
  Filled 2021-07-08 (×2): qty 1

## 2021-07-08 MED ORDER — POTASSIUM CHLORIDE CRYS ER 20 MEQ PO TBCR: 100.0000 meq | EXTENDED_RELEASE_TABLET | ORAL | Status: DC

## 2021-07-08 MED ORDER — TORSEMIDE 20 MG PO TABS
100.0000 mg | ORAL_TABLET | Freq: Two times a day (BID) | ORAL | Status: DC
Start: 2021-07-08 — End: 2021-07-08
  Administered 2021-07-08: 100 mg via ORAL
  Filled 2021-07-08: qty 5

## 2021-07-08 MED ORDER — BUPRENORPHINE HCL-NALOXONE HCL 8-2 MG SL SUBL
1.0000 | SUBLINGUAL_TABLET | Freq: Every day | SUBLINGUAL | Status: DC
  Administered 2021-07-08 – 2021-07-17 (×10): 1 via SUBLINGUAL
  Filled 2021-07-08 (×10): qty 1

## 2021-07-08 MED ORDER — SPIRONOLACTONE 25 MG PO TABS
25.0000 mg | ORAL_TABLET | Freq: Every day | ORAL | Status: DC
Start: 2021-07-08 — End: 2021-07-15
  Administered 2021-07-08 – 2021-07-14 (×7): 25 mg via ORAL
  Filled 2021-07-08 (×7): qty 1

## 2021-07-08 MED ORDER — CHLORHEXIDINE GLUCONATE CLOTH 2 % EX PADS
6.0000 | MEDICATED_PAD | Freq: Every day | CUTANEOUS | Status: DC
  Administered 2021-07-09 – 2021-07-17 (×8): 6 via TOPICAL

## 2021-07-08 MED ORDER — TRAMADOL HCL 50 MG PO TABS
50.0000 mg | ORAL_TABLET | Freq: Four times a day (QID) | ORAL | Status: DC | PRN
  Filled 2021-07-08: qty 1

## 2021-07-08 MED ORDER — ONDANSETRON HCL 4 MG PO TABS
4.0000 mg | ORAL_TABLET | Freq: Four times a day (QID) | ORAL | Status: DC | PRN
  Administered 2021-07-10 – 2021-07-11 (×2): 4 mg via ORAL
  Filled 2021-07-08 (×2): qty 1

## 2021-07-08 MED ORDER — METOLAZONE 5 MG PO TABS
2.5000 mg | ORAL_TABLET | ORAL | Status: DC
Start: 2021-07-09 — End: 2021-07-09

## 2021-07-08 MED ORDER — SALINE SPRAY 0.65 % NA SOLN
1.0000 | NASAL | Status: DC | PRN
  Filled 2021-07-08: qty 44

## 2021-07-08 MED ORDER — ACETAMINOPHEN 650 MG RE SUPP: 650.0000 mg | Freq: Four times a day (QID) | RECTAL | Status: DC | PRN

## 2021-07-08 MED ORDER — POTASSIUM CHLORIDE CRYS ER 20 MEQ PO TBCR
40.0000 meq | EXTENDED_RELEASE_TABLET | Freq: Two times a day (BID) | ORAL | Status: DC
  Administered 2021-07-08 – 2021-07-09 (×3): 40 meq via ORAL
  Filled 2021-07-08 (×4): qty 2

## 2021-07-08 MED ORDER — ASPIRIN 81 MG PO CHEW
324.0000 mg | CHEWABLE_TABLET | Freq: Once | ORAL | Status: AC
  Administered 2021-07-08: 324 mg via ORAL
  Filled 2021-07-08: qty 4

## 2021-07-08 NOTE — ED Notes (Signed)
Pt stated he had side pain 8/10. Notified Terrilee Croak MD. ? ?MD placed order for Tramadol. Pt stated he rather not have anything because Tramadol makes his head feel weird. MD made aware. ?

## 2021-07-08 NOTE — ED Notes (Signed)
[facility] called and stated that pt exceeds their wight limit for table so they will be unable to do VQ scan - oncoming RN and MD notified  ?

## 2021-07-08 NOTE — ED Notes (Addendum)
While in the room with pt - pt starts clutching his chest and reporting 8/10 chest pain with SOB and HR jumped to 140s - MD Dixon called to bedside to assess pt - EKG obtained and troponin sent  ?

## 2021-07-08 NOTE — ED Notes (Signed)
Provided pt some crackers d/t stomach cramping and slightly hurting. ?

## 2021-07-08 NOTE — Consult Note (Addendum)
?  ?Advanced Heart Failure Team Consult Note ? ? ?Primary Physician: Enid Skeens., MD ?PCP-Cardiologist:  Berniece Salines, DO ?AHFC: Dr. [name]  ? ?Reason for Consultation: Acute on Chronic Diastolic Heart Failure  ? ?HPI:   ? ?Charles Gray is seen today for evaluation of acute on chronic diastolic heart failure at the request of Dr. Pietro Cassis, Internal Medicine.  ? ?66 y.o. 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   ?  ?He has had multiple hospitalizations in 2021 for CHF exacerbation, requiring IV Lasix and change in PO regimen from lasix to torsemide. Echo 4/21 showed normal LVEF 55-60% w/ G1DD. RV not well visualized, but felt to have component of RV failure. [place] 4/21 showed normal PA pressure and normal cardiac output. PFTs also completed and c/w mild obstructive airway disease.  ?  ?In 7/21, he had a coronary CTA.  This showed no coronary calcium but there was a concern for soft plaque in the distal LCx and distal RCA with hemodynamically significant stenosis.  He was managed medically. He has not had any chest pain, but was started on ranolazine after his CTA.  He feels like it makes his "head fuzzy."   ?  ?Admitted with A/C diastolic heart failure. Diuresed with IV lasix and transitioned to bumex 3 mg twice a day. Discharge weight 360 pounds. Discharged 02/09/2020.  ?  ?He was admitted again in 12/21 with CHF.  Echo in 12/21 showed EF 67-12%, grade 2 diastolic dysfunction, RV poorly visualized.  ?  ?OV on 12/21 still having significant exertional dyspnea, short of breath just walking around the house.  + Orthopnea, has to sleep on the sofa.  Sleeps poorly, cannot tolerate CPAP.  Cardiomems was discussed but patient reluctant, Jardiance added, metolazone increased to 2 x/week, metoprolol stopped, and Toprol XL 25 mg daily started. ?  ?Admitted 4/58/09 with A/C diastolic heart failure. Diuresed with IV lasix +  metolazone. Transitioned to torsemide 60 mg twice a day and metolazone twice a day. D/C weight 364 pounds.  ?  ?Admitted 5/28//22 with cellulitis. Treated with antibiotics.  ?  ?Returned 09/10/20 for HF follow up. Had palpitations and EMS checked his EKG. EKG showed SR with rate in the 70s. Having ongoing leg swelling. SOB with exertion.  Weight at home 373-381 pounds. Zio patch placed. ?  ?Admitted 09/20/20 admitted with SOB and leg swelling secondary to A/CHF. He was diuresed with IV lasix 5 L, weight 371 at discharge. ?  ?He was admitted for A/C CHF 7/22 after presenting with LE swelling and complaints of dizziness with fall.  He had failed outpatient IV diuresis. He was diuresed with IV lasix + metolazone. He underwent RHC showing optimized filling pressures and preserved CO/CI. Discussed Cardiomems and he agreed to work up. He was discharged on torsemide 100 mg bid + metolazone M/W/F. Discharge weight 374 lbs. ?  ?RHC (8/22) showed normal filling pressures & he underwent Cardiomems placement. ?  ?Admitted 9/10-9/12/22 for acute encephalopathy, felt secondary to polypharmacy. Ambien and gabapentin were briefly held. Ammonia mildly elevated and he was started on lactulose. Mentation returned to baseline and he was discharged home with Home Health PT/OT. ?  ?Patient presented to ED on 3/27 with complaint of bilateral lower extremity redness and swelling, progressively worsening for several days also accompanied by increasing shortness of breath for last few days.He received a course of oral antibiotics as an outpatient for suspected leg  cellulitis without improvement. In ED, Hs trop negative x 2, 7>>6. CT angio of chest was limited because of suboptimal opacification and artifact.  It suspected bilateral lower lobe segmental and subsegmental pulm emboli but no evidence of Rt heart strain. The patient is on Eliquis and reports full compliance. F/u V/Q scan has been ordered. LE venous dopplers negative for DVT.   ? ?AHF team consulted to assess for possible a/c CHF, though BNP is normal at 79. CardioMEMs report pulled. Most recent submission was on 3/24 and dPAP was 10 mmHg which is his set goal. For the last 3 wks he has been at or below goal. CXR showed mild chronic vascular congestion. No effusion. Chest CT also unremarkable for effusion. Pt dose endorse recent 10 lb wt gain and NYHA Class lllb symptoms. Admits to poor compliance w/ metolazone (scheduled to take 3 days a week). Reports full compliance w/ torsemide and full compliance w/ Eliquis.  ? ?EKGs tele show ? Afib vs SR w/ PACs. Rates controlled. BP normotensive. SCr ok, 1.10, LFTs WNL. Albumin slightly low at 3.2. WBC nl.  ? ? ?Cardiomems: PAD 10 on 3/24, (goal 10). ?  ? ?Echo 10/29/20 ?1. Left ventricular ejection fraction, by estimation, is 60 to 65%. The left ventricle has normal ?function. The left ventricle has no regional wall motion abnormalities. There is mild ?concentric left ventricular hypertrophy. Left ventricular diastolic parameters were normal. ?2. Right ventricular systolic function is normal. The right ventricular size is normal. Tricuspid ?regurgitation signal is inadequate for assessing PA pressure. ?3. Left atrial size was mildly dilated. ?4. The mitral valve is normal in structure. No evidence of mitral valve regurgitation. No ?evidence of mitral stenosis. ?5. The aortic valve is tricuspid. Aortic valve regurgitation is not visualized. No aortic stenosis is ?present. ?6. There is borderline dilatation of the aortic root, measuring 38 mm. ? ?Review of Systems: [y] = yes, [ ]  = no  ? ?General: Weight gain [ Y]; Weight loss [ ] ; Anorexia [ ] ; Fatigue [ ] ; Fever [ ] ; Chills [ ] ; Weakness [ ]   ?Cardiac: Chest pain/pressure [ ] ; Resting SOB [ Y]; Exertional SOB [Y ]; Orthopnea [ ] ; Pedal Edema [Y]; Palpitations [ ] ; Syncope [ ] ; Presyncope [ ] ; Paroxysmal nocturnal dyspnea[ ]   ?Pulmonary: Cough [ ] ; Wheezing[ ] ; Hemoptysis[ ] ; Sputum [ ] ; Snoring [  ]  ?GI: Vomiting[ ] ; Dysphagia[ ] ; Melena[ ] ; Hematochezia [ ] ; Heartburn[ ] ; Abdominal pain [ ] ; Constipation [ ] ; Diarrhea [ ] ; BRBPR [ ]   ?GU: Hematuria[ ] ; Dysuria [ ] ; Nocturia[ ]   ?Vascular: Pain in legs with walking [ ] ; Pain in feet with lying flat [ ] ; Non-healing sores [ ] ; Stroke [ ] ; TIA [ ] ; Slurred speech [ ] ;  ?Neuro: Headaches[ ] ; Vertigo[ ] ; Seizures[ ] ; Paresthesias[ ] ;Blurred vision [ ] ; Diplopia [ ] ; Vision changes [ ]   ?Ortho/Skin: Arthritis [ ] ; Joint pain [ ] ; Muscle pain [ ] ; Joint swelling [ ] ; Back Pain Y]; Rash [ ]   ?Psych: Depression[ ] ; Anxiety[ ]   ?Heme: Bleeding problems [ ] ; Clotting disorders [ ] ; Anemia [ ]   ?Endocrine: Diabetes [ ] ; Thyroid dysfunction[ ]  ? ?Home Medications ?Prior to Admission medications   ?Medication Sig Start Date End Date Taking? Authorizing Provider  ?apixaban (ELIQUIS) 5 MG TABS tablet Take 1 tablet (5 mg total) by mouth 2 (two) times daily. 04/11/21  Yes Demaio, Alexa, MD  ?Buprenorphine HCl-Naloxone HCl 8-2 MG FILM Place 1 tablet under the tongue 2 (two) times  daily.   Yes [provider]  ?doxycycline (VIBRAMYCIN) 100 MG capsule Take 100 mg by mouth 2 (two) times daily. 10 day course. Pt started 1st dose yesterday 07/01/21  Yes [provider]  ?eplerenone (INSPRA) 50 MG tablet TAKE 2 TABLETS BY MOUTH DAILY ?Patient taking differently: Take 100 mg by mouth daily. 03/26/21  Yes Larey Dresser, MD  ?gabapentin (NEURONTIN) 800 MG tablet Take 1 tablet (800 mg total) by mouth 3 (three) times daily. 03/23/19  Yes Angiulli, Lavon Paganini, PA-C  ?lactulose (CHRONULAC) 10 GM/15ML solution Take 15 mLs (10 g total) by mouth 3 (three) times daily. 12/23/20  Yes Shelly Coss, MD  ?metolazone (ZAROXOLYN) 2.5 MG tablet Take 1 tablet (2.5 mg total) by mouth 3 (three) times a week. Monday, Wednesday and Fridays ?Patient taking differently: Take 2.5 mg by mouth every Monday, Wednesday, and Friday. 11/15/20  Yes Joette Catching, PA-C  ?metoprolol  succinate (TOPROL XL) 25 MG 24 hr tablet Take 1 tablet (25 mg total) by mouth at bedtime. 05/13/21 08/11/21 Yes Larey Dresser, MD  ?pantoprazole (PROTONIX) 40 MG tablet Take 40 mg by mouth 2 (two) times daily befo

## 2021-07-08 NOTE — Progress Notes (Signed)
ANTICOAGULATION CONSULT NOTE - Initial Consult ? ?Pharmacy Consult for Heparin  ?Indication: pulmonary embolus, afib ? ?Allergies  ?Allergen Reactions  ? Penicillins Other (See Comments)  ?  Caused convulsions as a child  ?Did it involve swelling of the face/tongue/throat, SOB, or low BP? N/A ?Did it involve sudden or severe rash/hives, skin peeling, or any reaction on the inside of your mouth or nose? N/A ?Did you need to seek medical attention at a hospital or doctor's office?N/A ?When did it last happen? N/A ?If all above answers are "NO", may proceed with cephalosporin use.  ? ? ?Vital Signs: ?BP: 124/66 (03/28 0345) ?Pulse Rate: 75 (03/28 0345) ? ?Labs: ?Recent Labs  ?  07/07/21 ?1616 07/07/21 ?2236 07/08/21 ?0003  ?HGB 10.1*  --   --   ?HCT 34.4*  --   --   ?PLT 179  --   --   ?CREATININE 1.10  --   --   ?TROPONINIHS  --  7 6  ? ? ?Estimated Creatinine Clearance: 124 mL/min (by C-G formula based on SCr of 1.1 mg/dL). ? ? ?Medical History: ?Past Medical History:  ?Diagnosis Date  ? Acute on chronic congestive heart failure ([place])   ? Acute on chronic diastolic CHF (congestive heart failure) ([place]) 08/23/2019  ? Acute on chronic right-sided heart failure ([place]) 08/06/2019  ? Acute respiratory failure with hypoxia ([place])   ? Anemia of chronic disease   ? Atrial fibrillation ([place]) 12/23/2019  ? Atrial tachycardia ([place]) 08/12/2019  ? Benign essential HTN   ? Chest pain 08/23/2019  ? Chronic anticoagulation 12/26/2019  ? Chronic heart failure with preserved EF 05/30/2019  ? Echo 07/2019: EF 55-60, GR 1 DD  ? Chronic pain syndrome   ? Cirrhosis of liver without ascites ([place])   ? Class 2 severe obesity due to excess calories with serious comorbidity and body mass index (BMI) of 39.0 to 39.9 in adult [facility]) 05/30/2019  ? Debility 03/13/2019  ? Drug induced constipation   ? Dyspnea   ? Fatty liver disease, nonalcoholic   ? GIB (gastrointestinal bleeding) 03/04/2019  ? Heart failure, systolic, acute ([place]) 10/08/3660  ? History of  pulmonary embolism 08/12/2019  ? Hyperlipidemia 01/24/2019  ? Hypertension 01/24/2019  ? Hypoalbuminemia due to protein-calorie malnutrition ([place])   ? Hypokalemia   ? Hyponatremia   ? Hypotension due to drugs   ? Insomnia 01/24/2019  ? Lactic acidosis 08/23/2019  ? Lumbar disc herniation 01/24/2019  ? Mixed hyperlipidemia 01/24/2019  ? Morbid obesity with BMI of 40.0-44.9, adult ([place]) 05/30/2019  ? Normocytic anemia 06/08/2019  ? Occasional tremors 08/12/2019  ? OSA (obstructive sleep apnea) 08/12/2019  ? Palpitations 12/26/2019  ? Peripheral edema   ? Personal history of DVT (deep vein thrombosis) 12/26/2019  ? Portal hypertensive gastropathy ([place]) 03/06/2019  ? Seen on EGD 03/06/2019  ? Pulmonary emboli ([place]) 06/08/2019  ? Pulmonary embolism ([place]) 06/08/2019  ? Redness and swelling of lower leg 08/23/2019  ? Shock ([place])   ? Thrombocytopenia ([place])   ? ? ? ?Assessment: ?66 y/o M with palpitations and chest pain found to have possible PE on CT. Pt is on apixaban PTA for history of VTE and afib. Just had a dose of apixaban a few hours ago but with possible new PE will start heparin now with no bolus. Hgb 10.1. Renal function good. Anticipate using aPTT to dose for now.  ? ?Goal of Therapy:  ?Heparin level 0.3-0.7 units/ml ?aPTT 66-102 seconds ?Monitor platelets by anticoagulation protocol:  Yes ?  ?Plan:  ?Start heparin drip at 2400 units/hr ?Heparin level and aPTT at 1300 ?Daily CBC, heparin level, and aPTT ?Monitor for bleeding ? ?Narda Bonds, PharmD, BCPS ?Clinical Pharmacist ?Phone: [phone] ? ? ? ?

## 2021-07-08 NOTE — ED Notes (Signed)
Pt moved to hospital bed for comfort at this time  ?

## 2021-07-08 NOTE — Assessment & Plan Note (Addendum)
Patient was admitted to the cardiac ward and underwent aggressive diuresis with IV furosemide.  ?Negative fluid balance was achieved, -15,107 ml, since admission with significant improvement in his symptoms.  ?  ?Echocardiogram with preserved LV systolic function EF 60 to 53%, RV systolic function preserved, no significant valvular disease.  ? ?Patient will continue diuresis with torsemide, eplrenone, and three time per week metolazone.  ?Close follow up on renal function and electrolytes.  ?Continue with unna boots and home health services.  ?Continue with b blockade with metoprolol 25 mg succinate.  ?

## 2021-07-08 NOTE — Assessment & Plan Note (Addendum)
Continue metoprolol for rate control and anticoagulation with apixaban.  ? ?

## 2021-07-08 NOTE — ED Notes (Signed)
Lab called to run HIVs  ?

## 2021-07-08 NOTE — Progress Notes (Signed)
Bilateral lower extremity venous duplex completed. ?Refer to "CV Proc" under chart review to view preliminary results. ? ?07/08/2021 8:10 AM ?Kelby Aline., MHA, RVT, RDCS, RDMS   ?

## 2021-07-08 NOTE — Progress Notes (Signed)
Peripherally Inserted Central Catheter Placement ? ?The IV Nurse has discussed with the patient and/or persons authorized to consent for the patient, the purpose of this procedure and the potential benefits and risks involved with this procedure.  The benefits include less needle sticks, lab draws from the catheter, and the patient may be discharged home with the catheter. Risks include, but not limited to, infection, bleeding, blood clot (thrombus formation), and puncture of an artery; nerve damage and irregular heartbeat and possibility to perform a PICC exchange if needed/ordered by physician.  Alternatives to this procedure were also discussed.  Bard Power PICC patient education guide, fact sheet on infection prevention and patient information card has been provided to patient /or left at bedside.  PICC inserted by Beatriz Stallion, RN ? ? ?PICC Placement Documentation  ?PICC Double Lumen 09/73/53 Right Basilic 47 cm 0 cm (Active)  ?Indication for Insertion or Continuance of Line Chronic illness with exacerbations (CF, Sickle Cell, etc.) 07/08/21 2210  ?Exposed Catheter (cm) 0 cm 07/08/21 2210  ?Site Assessment Clean;Dry;Intact 07/08/21 2210  ?Lumen #1 Status Flushed;Saline locked;Blood return noted 07/08/21 2210  ?Lumen #2 Status Flushed;Saline locked;Blood return noted 07/08/21 2210  ?Dressing Type Securing device 07/08/21 2210  ?Dressing Status Antimicrobial disc in place 07/08/21 2210  ?Safety Lock Not Applicable 29/92/42 6834  ?Line Care Connections checked and tightened 07/08/21 2210  ?Line Adjustment (NICU/IV Team Only) No 07/08/21 2210  ?Dressing Intervention New dressing 07/08/21 2210  ?Dressing Change Due 07/15/21 07/08/21 2210  ? ? ? ? ? ?Kunta Hilleary, Nicolette Bang ?07/08/2021, 10:11 PM ? ?

## 2021-07-08 NOTE — ED Notes (Signed)
Pt states he usually take Zolpidem 10 MG to sleep at night, Suboxone 2x a day, and five potassium pills daily.  ? ?MD is made aware. ?

## 2021-07-08 NOTE — Progress Notes (Signed)
?  Transition of Care (TOC) Screening Note ? ? ?Patient Details  ?Name: Charles Gray ?Date of Birth: [birth year] ? ? ?Transition of Care (TOC) CM/SW Contact:    ?Ivannia Willhelm, LCSW ?Phone Number: ?07/08/2021, 3:20 PM ? ? ? ?Transition of Care Department [facility]) has reviewed patient and no TOC needs have been identified at this time. We will continue to monitor patient advancement through interdisciplinary progression rounds. Patient will benefit from PT/OT consult for disposition recommendations. If new patient transition needs arise, please place a TOC consult. ?  ?

## 2021-07-08 NOTE — ED Provider Notes (Signed)
Care of the patient assumed at the change of shift. Initially here for LE edema. Had extensive workup for a variety of additional complaints and was eventually deemed safe for discharge. While awaiting PTAR transport home, patient began having palpitations and chest pains. Repeat Trop, CTA pending at the change of shift.  ? ?Physical Exam  ?BP (!) 145/68   Pulse 78   Temp 98.7 ?F (37.1 ?C) (Oral)   Resp 11   SpO2 98%  ? ?Physical Exam ? ?Procedures  ?Procedures ? ?ED Course / MDM  ? ?Clinical Course as of 07/08/21 0405  ?Tue Jul 08, 2021  ?0350 Trop remains neg. I personally viewed the images from radiology studies and agree with radiologist interpretation: Poor quality but concerning for distal PE. Patient reports compliance with Eliquis. Will discuss with medicine for admission. He is more comfortable now, sleeping soundly on reassessment.  ? [CS]  ?28 Spoke with Dr. Marlyce Huge, Hospitalist who will evaluate for admission. Requests Heparin be initiated pending further evaluation.  [CS]  ?  ?Clinical Course User Index ?[CS] Truddie Hidden, MD  ? ?Medical Decision Making ?Problems Addressed: ?Other pulmonary embolism without acute cor pulmonale, unspecified chronicity ([place]): acute illness or injury ? ?Amount and/or Complexity of Data Reviewed ?Labs: ordered. ?Radiology: ordered and independent interpretation performed. Decision-making details documented in ED Course. ? ?Risk ?OTC drugs. ?Prescription drug management. ?Decision regarding hospitalization. ? ? ? ? ? ? ? ?  ?Truddie Hidden, MD ?07/08/21 [phone] ? ?

## 2021-07-08 NOTE — Assessment & Plan Note (Addendum)
Plan to continue anticoagulation with apixaban. ?Lower extremity US negative for DVT.   ?

## 2021-07-08 NOTE — H&P (Signed)
?History and Physical  ? ? ?Patient: Charles Gray MRN: [id number] DOA: 07/07/2021 ? ?Date of Service: the patient was seen and examined on 07/08/2021 ? ?Patient coming from: Home via EMS ? ?Chief Complaint:  ?Chief Complaint  ?Patient presents with  ? Cellulitis  ?  Bilateral lower extremities   ? ? ?HPI:  ? ?66 year old male with past medical history of obstructive sleep apnea, depression, chronic pain syndrome, paroxysmal atrial fibrillation (on eliquis), history of DVT and pulmonary embolism, cirrhosis, obesity, diastolic congestive heart failure (Echo 10/2020 EF 60-65%), COPD presenting to [facility] emergency department with complaints of redness and swelling of the bilateral lower extremities. ? ?Patient reports a several day history of increasing bilateral lower extremity swelling.  This is associated with increasing discomfort of the bilateral lower extremities.  As of late, he has also been experiencing increasing shortness of breath as of late. ? ?Patient received a course of oral antibiotics for his suspected leg cellulitis and despite taking this course of therapy he reports no change in his symptoms. ? ?Due to patient's progressively worsening symptoms of leg swelling, pain and dyspnea on exertion the patient eventually presented to [facility] emergency department via EMS with these complaints. ? ?Upon evaluation in the emergency department initial work-up for causes of patient's redness and swelling of his legs is essentially unremarkable.  Patient did however begin to complain of chest discomfort while in the emergency department.  Troponin was unremarkable.  Patient underwent CT angiogram of the chest which revealed suspected bilateral lower lobe segmental and subsegmental pulmonary emboli.  This was concerning due to the fact that the patient is on scheduled Eliquis therapy.  Patient was initiated on a heparin infusion and the hospitalist group was then called to assess the patient  admission to hospital. ? ?Review of Systems: Review of Systems  ?Respiratory:  Positive for cough and shortness of breath.   ?Cardiovascular:  Positive for chest pain and leg swelling.  ? ? ?Past Medical History:  ?Diagnosis Date  ? Acute on chronic congestive heart failure ([place])   ? Acute on chronic diastolic CHF (congestive heart failure) ([place]) 08/23/2019  ? Acute on chronic right-sided heart failure ([place]) 08/06/2019  ? Acute respiratory failure with hypoxia ([place])   ? Anemia of chronic disease   ? Atrial fibrillation ([place]) 12/23/2019  ? Atrial tachycardia ([place]) 08/12/2019  ? Benign essential HTN   ? Chest pain 08/23/2019  ? Chronic anticoagulation 12/26/2019  ? Chronic heart failure with preserved EF 05/30/2019  ? Echo 07/2019: EF 55-60, GR 1 DD  ? Chronic pain syndrome   ? Cirrhosis of liver without ascites ([place])   ? Class 2 severe obesity due to excess calories with serious comorbidity and body mass index (BMI) of 39.0 to 39.9 in adult [facility]) 05/30/2019  ? Debility 03/13/2019  ? Drug induced constipation   ? Dyspnea   ? Fatty liver disease, nonalcoholic   ? GIB (gastrointestinal bleeding) 03/04/2019  ? Heart failure, systolic, acute ([place]) 07/09/9240  ? History of pulmonary embolism 08/12/2019  ? Hyperlipidemia 01/24/2019  ? Hypertension 01/24/2019  ? Hypoalbuminemia due to protein-calorie malnutrition ([place])   ? Hypokalemia   ? Hyponatremia   ? Hypotension due to drugs   ? Insomnia 01/24/2019  ? Lactic acidosis 08/23/2019  ? Lumbar disc herniation 01/24/2019  ? Mixed hyperlipidemia 01/24/2019  ? Morbid obesity with BMI of 40.0-44.9, adult ([place]) 05/30/2019  ? Normocytic anemia 06/08/2019  ? Occasional tremors 08/12/2019  ? OSA (obstructive  sleep apnea) 08/12/2019  ? Palpitations 12/26/2019  ? Peripheral edema   ? Personal history of DVT (deep vein thrombosis) 12/26/2019  ? Portal hypertensive gastropathy ([place]) 03/06/2019  ? Seen on EGD 03/06/2019  ? Pulmonary emboli ([place]) 06/08/2019  ? Pulmonary embolism ([place]) 06/08/2019  ? Redness and  swelling of lower leg 08/23/2019  ? Shock ([place])   ? Thrombocytopenia ([place])   ? ? ?Past Surgical History:  ?Procedure Laterality Date  ? BIOPSY  04/08/2021  ? Procedure: BIOPSY;  Surgeon: Ladene Artist, MD;  Location: [facility] ENDOSCOPY;  Service: Endoscopy;;  ? ESOPHAGOGASTRODUODENOSCOPY N/A 04/08/2021  ? Procedure: ESOPHAGOGASTRODUODENOSCOPY (EGD);  Surgeon: Ladene Artist, MD;  Location[facility] ENDOSCOPY;  Service: Endoscopy;  Laterality: N/A;  ? ESOPHAGOGASTRODUODENOSCOPY (EGD) WITH PROPOFOL N/A 03/05/2019  ? Procedure: ESOPHAGOGASTRODUODENOSCOPY (EGD) WITH PROPOFOL;  Surgeon: Doran Stabler, MD;  Location: [facility];  Service: Gastroenterology;  Laterality: N/A;  Large Blood Clot Removed  ? ESOPHAGOGASTRODUODENOSCOPY (EGD) WITH PROPOFOL N/A 03/06/2019  ? Procedure: ESOPHAGOGASTRODUODENOSCOPY (EGD) WITH PROPOFOL;  Surgeon: Thornton Park, MD;  Location: [facility];  Service: Gastroenterology;  Laterality: N/A;  ? HEMORROIDECTOMY    ? PRESSURE SENSOR/CARDIOMEMS N/A 11/14/2020  ? Procedure: PRESSURE SENSOR/CARDIOMEMS;  Surgeon: Larey Dresser, MD;  Location: [place] CV LAB;  Service: Cardiovascular;  Laterality: N/A;  ? RIGHT HEART CATH N/A 08/09/2019  ? Procedure: RIGHT HEART CATH;  Surgeon: Larey Dresser, MD;  Location: [place] [facility];  Service: Cardiovascular;  Laterality: N/A;  ? RIGHT HEART CATH N/A 11/01/2020  ? Procedure: RIGHT HEART CATH;  Surgeon: Larey Dresser, MD;  Location: [place] [facility];  Service: Cardiovascular;  Laterality: N/A;  ? RIGHT HEART CATH N/A 11/14/2020  ? Procedure: RIGHT HEART CATH;  Surgeon: Larey Dresser, MD;  Location: [place] [facility];  Service: Cardiovascular;  Laterality: N/A;  ? RIGHT/LEFT HEART CATH AND CORONARY ANGIOGRAPHY N/A 05/10/2020  ? Procedure: RIGHT/LEFT HEART CATH AND CORONARY ANGIOGRAPHY;  Surgeon: Larey Dresser, MD;  Location: [place] [facility];  Service: Cardiovascular;  Laterality: N/A;  ? ? ?Social History:  reports that he has never  smoked. He has never used smokeless tobacco. He reports that he does not currently use alcohol. He reports that he does not currently use drugs after having used the following drugs: Marijuana. ? ?Allergies  ?Allergen Reactions  ? Penicillins Other (See Comments)  ?  Caused convulsions as a child  ?Did it involve swelling of the face/tongue/throat, SOB, or low BP? N/A ?Did it involve sudden or severe rash/hives, skin peeling, or any reaction on the inside of your mouth or nose? N/A ?Did you need to seek medical attention at a hospital or doctor's office?N/A ?When did it last happen? N/A ?If all above answers are "NO", may proceed with cephalosporin use.  ? ? ?Family History  ?Problem Relation Age of Onset  ? Hyperlipidemia Mother   ? Hypertension Mother   ? Stroke Mother   ? CAD Maternal Grandmother   ? CAD Maternal Grandfather   ? ? ?Prior to Admission medications   ?Medication Sig Start Date End Date Taking? Authorizing Provider  ?acetaminophen (TYLENOL) 500 MG tablet Take 500-1,000 mg by mouth every 6 (six) hours as needed for mild pain, moderate pain or headache.    [provider]  ?apixaban (ELIQUIS) 5 MG TABS tablet Take 1 tablet (5 mg total) by mouth 2 (two) times daily. 04/11/21   Scarlett Presto, MD  ?Buprenorphine HCl-Naloxone HCl 8-2 MG  FILM Place 1 tablet under the tongue 2 (two) times daily.    [provider]  ?eplerenone (INSPRA) 50 MG tablet TAKE 2 TABLETS BY MOUTH DAILY ?Patient taking differently: Take 100 mg by mouth daily. 03/26/21   Larey Dresser, MD  ?gabapentin (NEURONTIN) 800 MG tablet Take 1 tablet (800 mg total) by mouth 3 (three) times daily. 03/23/19   Angiulli, Lavon Paganini, PA-C  ?lactulose (CHRONULAC) 10 GM/15ML solution Take 15 mLs (10 g total) by mouth 3 (three) times daily. 12/23/20   Shelly Coss, MD  ?metolazone (ZAROXOLYN) 2.5 MG tablet Take 1 tablet (2.5 mg total) by mouth 3 (three) times a week. Monday, Wednesday and Fridays ?Patient taking differently: Take  2.5 mg by mouth every Monday, Wednesday, and Friday. 11/15/20   Joette Catching, PA-C  ?metoprolol succinate (TOPROL XL) 25 MG 24 hr tablet Take 1 tablet (25 mg total) by mouth at bedtime. 05/13/21 5/1

## 2021-07-08 NOTE — Assessment & Plan Note (Addendum)
Continue Un[place] boots, follow-up suggested with lymphedema clinic. Continue HH twice weekly changes. ?

## 2021-07-08 NOTE — Progress Notes (Signed)
ANTICOAGULATION CONSULT NOTE  ? ?Pharmacy Consult for Heparin  ?Indication: pulmonary embolus, afib ? ?Allergies  ?Allergen Reactions  ? Penicillins Other (See Comments)  ?  Caused convulsions as a child  ?Did it involve swelling of the face/tongue/throat, SOB, or low BP? N/A ?Did it involve sudden or severe rash/hives, skin peeling, or any reaction on the inside of your mouth or nose? N/A ?Did you need to seek medical attention at a hospital or doctor's office?N/A ?When did it last happen? N/A ?If all above answers are "NO", may proceed with cephalosporin use.  ? ? ?Vital Signs: ?BP: 129/72 (03/28 1256) ?Pulse Rate: 73 (03/28 1257) ? ?Labs: ?Recent Labs  ?  07/07/21 ?1616 07/07/21 ?2236 07/08/21 ?0003 07/08/21 ?1344  ?HGB 10.1*  --   --  10.2*  ?HCT 34.4*  --   --  36.3*  ?PLT 179  --   --  162  ?APTT  --   --   --  66*  ?HEPARINUNFRC  --   --   --  >1.10*  ?CREATININE 1.10  --   --   --   ?TROPONINIHS  --  7 6  --   ? ? ? ?Estimated Creatinine Clearance: 123.3 mL/min (by C-G formula based on SCr of 1.1 mg/dL). ? ? ?Medical History: ?Past Medical History:  ?Diagnosis Date  ? Acute on chronic congestive heart failure ([place])   ? Acute on chronic diastolic CHF (congestive heart failure) ([place]) 08/23/2019  ? Acute on chronic right-sided heart failure ([place]) 08/06/2019  ? Acute respiratory failure with hypoxia ([place])   ? Anemia of chronic disease   ? Atrial fibrillation ([place]) 12/23/2019  ? Atrial tachycardia ([place]) 08/12/2019  ? Benign essential HTN   ? Chest pain 08/23/2019  ? Chronic anticoagulation 12/26/2019  ? Chronic heart failure with preserved EF 05/30/2019  ? Echo 07/2019: EF 55-60, GR 1 DD  ? Chronic pain syndrome   ? Cirrhosis of liver without ascites ([place])   ? Class 2 severe obesity due to excess calories with serious comorbidity and body mass index (BMI) of 39.0 to 39.9 in adult [facility]) 05/30/2019  ? Debility 03/13/2019  ? Drug induced constipation   ? Dyspnea   ? Fatty liver disease, nonalcoholic   ? GIB (gastrointestinal  bleeding) 03/04/2019  ? Heart failure, systolic, acute ([place]) 0/86/5784  ? History of pulmonary embolism 08/12/2019  ? Hyperlipidemia 01/24/2019  ? Hypertension 01/24/2019  ? Hypoalbuminemia due to protein-calorie malnutrition ([place])   ? Hypokalemia   ? Hyponatremia   ? Hypotension due to drugs   ? Insomnia 01/24/2019  ? Lactic acidosis 08/23/2019  ? Lumbar disc herniation 01/24/2019  ? Mixed hyperlipidemia 01/24/2019  ? Morbid obesity with BMI of 40.0-44.9, adult ([place]) 05/30/2019  ? Normocytic anemia 06/08/2019  ? Occasional tremors 08/12/2019  ? OSA (obstructive sleep apnea) 08/12/2019  ? Palpitations 12/26/2019  ? Peripheral edema   ? Personal history of DVT (deep vein thrombosis) 12/26/2019  ? Portal hypertensive gastropathy ([place]) 03/06/2019  ? Seen on EGD 03/06/2019  ? Pulmonary emboli ([place]) 06/08/2019  ? Pulmonary embolism ([place]) 06/08/2019  ? Redness and swelling of lower leg 08/23/2019  ? Shock ([place])   ? Thrombocytopenia ([place])   ? ? ? ?Assessment: ?66 y/o M with palpitations and chest pain found to have possible PE on CT. Pt is on apixaban PTA for history of VTE and afib. Just had a dose of apixaban a few hours ago but with possible new PE will start heparin  now with no bolus. Hgb 10.1.  ? ?Initial aPTT low end therapeutic, anti-Xa level elevated as expected with recent Eliquis dose, on 2400 units/hr ? ?Goal of Therapy:  ?Heparin level 0.3-0.7 units/ml ?aPTT 66-102 seconds ?Monitor platelets by anticoagulation protocol: Yes ?  ?Plan:  ?Increase heparin gtt to 2500 units/hr ?F/u aPTT/HL in 6 hours ? ?Bertis Ruddy, PharmD ?Clinical Pharmacist ?ED Pharmacist Phone # [phone] ?07/08/2021 4:02 PM ? ? ? ?

## 2021-07-08 NOTE — ED Notes (Signed)
This RN attempted x2 to start a second IV line - IV team consult placed  ?

## 2021-07-08 NOTE — ED Notes (Signed)
IV team at bedside 

## 2021-07-08 NOTE — ED Notes (Signed)
[place] Sensor ? ?Model CM2000 ?Serial [id number] ?Implant Date 04/AUG/2022 ?Pulmonary Artery Left ? ?Physician [place]  ?

## 2021-07-08 NOTE — Progress Notes (Signed)
?PROGRESS NOTE ? ?Charles Gray  ?DOB: 1956/03/16  ?PCP: Enid Skeens., MD ?KXF:[id number]  ?DOA: 07/07/2021 ? LOS: 0 days  ?Hospital Day: 2 ? ?Brief narrative: ?Charles Gray is a 66 y.o. male with PMH significant for morbid obesity, OSA, HTN, HLD, chronic diastolic CHF, history of DVT/PE, A-fib on anticoagulation, COPD, liver cirrhosis, chronic pain on opioids, chronic constipation ?Patient presented to ED on 3/27 with complaint of bilateral lower extremity redness and swelling, progressively worsening for several days also accompanied by increasing shortness of breath for last few days. ?He received a course of oral antibiotics as an outpatient for suspected leg cellulitis without improvement. ? ?In the ED, patient was afebrile, hemodynamically stable ?Labs with potassium low at 3.4, BUN/creatinine normal, lactic acid was initially elevated to 2.3, hemoglobin 10.1. ?He complained of chest discomfort. Troponin unremarkable ?CT angio of chest was limited because of suboptimal opacification and artifact.  It suspected bilateral lower lobe segmental and subsegmental pulm emboli.  Of note, patient is on scheduled Eliquis and reports compliance to it. ? ?Admitted to hospital service. ?  ?Subjective: ?Patient was seen and examined this morning in the ED.  Pleasant elderly morbidly obese male.  Not in distress.  Alert, awake, oriented x3.  Says he has been less active because of progressively worsening bilateral lower extremity edema. ?Chart reviewed, remains hemodynamically stable ? ?Principal Problem: ?  Acute pulmonary embolism ([place]) ?Active Problems: ?  Chronic diastolic HF (heart failure) ([place]) ?  Paroxysmal atrial fibrillation (HCC) ?  COPD with acute exacerbation ([place]) ?  Lymphedema ?  Essential hypertension ?  GERD without esophagitis ?  ? ?Assessment and Plan: ?Acute pulmonary embolism ?Bilateral lower extremity swelling and redness ?History of DVT/PE on Eliquis ?-Presented with worsening bilateral lower  extremity edema, redness, chest pain, shortness of breath  ?-CT angio of chest was suboptimal and suspected bilateral lower lobe segmental and subsegmental PE. ?-This is concerning as patient is already on Eliquis.  He reports compliance to it. ?-Patient was switched to IV heparin drip on admission. ?-Negative DVT scan of both lower extremities. Unable to do VQ scan because of his weight ?-We may need to repeat a CT angio of chest tomorrow to confirm. ? ?Acute on Chronic diastolic CHF ?Bilateral lower extremity swelling and redness ?Essential hypertension ?-Patient reports progressively worsening bilateral lower extremity edema limiting his ability to move.   ?-DVT scan ruled out acute bilateral DVT. ?-Last echocardiogram from July 2022 with EF 60 to 65%, mild concentric LVH ?-PTA, patient was on Toprol 25 mg daily, torsemide 100 mg twice daily, metolazone 2.5 mg daily MWF, eplerenone 25 mg daily ?-BNP unremarkable. Chest imaging did not suggest pulmonary edema.  He has untreated sleep apnea and may have significant cor pulmonale. ?-I think patient has significant volume overload and will benefit from IV diuresis.  Based on the magnitude of dialysis she requires at home on regular basis, I would involve CHF team. ?-Net IO Since Admission: -295 mL [07/08/21 1515] ?-Continue to monitor for daily intake output, weight, blood pressure, BNP, renal function and electrolytes. ?Recent Labs  ?Lab 07/07/21 ?1616 07/07/21 ?1827 07/07/21 ?2236  ?BNP  --   --  79.9  ?BUN 10  --   --   ?CREATININE 1.10  --   --   ?K 3.4*  --   --   ?MG  --  1.8  --   ? ?Hypokalemia ?-Potassium level low at 3.4 on admission. ?-Resume daily potassium supplement.  It seems patient was  taking 140 mEq potassium twice daily with metolazone MWF and 100 mEq twice daily on other days.  We will try him 40 mEq twice daily for now and monitor. ?-He is also on eplerenone.  Continue to monitor. ?Recent Labs  ?Lab 07/07/21 ?1616 07/07/21 ?1827  ?K 3.4*  --    ?MG  --  1.8  ? ?Paroxysmal atrial fibrillation ?-Currently rate controlled metoprolol. ?-Heparin drip  ?-Continue to monitor in telemetry ? ?Hyperlipidemia ?-Pravastatin 20 mg daily ? ?COPD  ?-Not in acute exacerbation  ?-Continue bronchodilators.   ? ?GERD without esophagitis ?-Continue PPI ? ?Chronic pain ?-PTA, patient was on Suboxone 1 tab sublingual twice daily, Neurontin 800 mg 3 times daily, ? ?Chronic constipation ?-PTA, patient was on lactulose 10 g 3 times daily, MiraLAX daily ? ?Anxiety/depression ?-Zoloft 50 mg daily ? ?BPH ?-0.4 mg daily ? ?Goals of care ?  Code Status: Full Code  ? ?Mobility: Encourage ambulation. PT eval ordered. ? ?Nutritional status:  ?Body mass index is 40.26 kg/m?.  ?  ?  ? ? ? ? ?Diet:  ?Diet Order   ? ?       ?  Diet Heart Room service appropriate? Yes; Fluid consistency: Thin  Diet effective now       ?  ? ?  ?  ? ?  ? ? ?DVT prophylaxis: Heparin drip ? ?Antimicrobials: None ?Fluid: None ?Consultants: Cardiology consulted ?Family Communication: Family not at bedside ? ?Status is: Observation ? ?Continue in-hospital care because: Needs volume management ?Level of care: Telemetry Medical  ? ?Dispo: The patient is from: Home ?             Anticipated d/c is to: Pending clinical course and PT eval ?             Patient currently is not medically stable to d/c. ?  Difficult to place patient No ? ? ? ? ?Infusions:  ? heparin 2,400 Units/hr (07/08/21 5009)  ? ? ?Scheduled Meds: ? gabapentin  800 mg Oral TID  ? lactulose  10 g Oral TID  ? [START ON 07/09/2021] metolazone  2.5 mg Oral Q M,W,F  ? metoprolol succinate  25 mg Oral QHS  ? pantoprazole  40 mg Oral BID AC  ? potassium chloride  40 mEq Oral BID  ? pravastatin  20 mg Oral QHS  ? sertraline  50 mg Oral Daily  ? spironolactone  25 mg Oral Daily  ? tamsulosin  0.4 mg Oral Daily  ? torsemide  100 mg Oral BID  ? ? ?PRN meds: ?acetaminophen **OR** acetaminophen, ipratropium-albuterol, ondansetron **OR** ondansetron (ZOFRAN) IV,  polyethylene glycol, sodium chloride, traMADol  ? ?Antimicrobials: ?Anti-infectives (From admission, onward)  ? ? None  ? ?  ? ? ?Objective: ?Vitals:  ? 07/08/21 1256 07/08/21 1257  ?BP: 129/72   ?Pulse: 73 73  ?Resp: 13 13  ?Temp:    ?SpO2: 98% 98%  ? ? ?Intake/Output Summary (Last 24 hours) at 07/08/2021 1515 ?Last data filed at 07/08/2021 1204 ?Gross per 24 hour  ?Intake 700 ml  ?Output 995 ml  ?Net -295 ml  ? ?Filed Weights  ? 07/08/21 0713  ?Weight: (!) 174.6 kg  ? ?Weight change:  ?Body mass index is 40.26 kg/m?.  ? ?Physical Exam: ?General exam: Pleasant elderly morbidly obese Caucasian male. ?Skin: No rashes, lesions or ulcers. ?HEENT: Atraumatic, normocephalic, no obvious bleeding ?Lungs: Clear to auscultation bilaterally ?CVS: Regular rate and rhythm, no murmur ?GI/Abd soft, distended from obesity, nontender, bowel  sound present ?CNS: Alert, awake, oriented x3 ?Psychiatry: Sad affect ?Extremities: huge bilateral lower extremity pedal edema. ? ?Data Review: I have personally reviewed the laboratory data and studies available. ? ?F/u labs ordered  ?Unresulted Labs (From admission, onward)  ? ?  Start     Ordered  ? 07/09/21 0500  Comprehensive metabolic panel  Tomorrow morning,   R       ? 07/08/21 0644  ? 07/09/21 0500  Magnesium  Tomorrow morning,   R       ? 07/08/21 0644  ? 07/09/21 0500  CBC WITH DIFFERENTIAL  Tomorrow morning,   R       ? 07/08/21 0644  ? 07/09/21 0500  APTT  Daily,   R     ? 07/08/21 1041  ? 07/09/21 0500  Heparin level (unfractionated)  Daily,   R     ? 07/08/21 1041  ? 07/08/21 1300  Heparin level (unfractionated)  Once-Timed,   TIMED       ? 07/08/21 0413  ? 07/08/21 1300  APTT  Once-Timed,   TIMED       ? 07/08/21 0413  ? 07/07/21 1602  Urine Culture  (Undifferentiated presentation (screening labs and basic nursing orders))  ONCE - STAT,   STAT       ?Question:  Indication  Answer:  Sepsis  ? 07/07/21 1602  ? ?  ?  ? ?  ? ? ?Signed, ?Terrilee Croak, MD ?[facility] ?07/08/2021 ? ? ? ? ? ? ? ? ? ? ? ? ?

## 2021-07-08 NOTE — ED Notes (Signed)
RN assisted pt to restroom in wheelchair ?

## 2021-07-08 NOTE — Assessment & Plan Note (Addendum)
Continue antiacid therapy with proton pump inhibitors.  ?

## 2021-07-08 NOTE — Discharge Instructions (Addendum)
Elevate your legs when possible.  Compression wraps/stockings also help to lower the swelling in your legs.  Continue to take your diuretic medications: Torsemide and metolazone.  Talk to your primary care doctor about your current home medications.  Return to the ED for any new or worsening symptoms. ?

## 2021-07-08 NOTE — Progress Notes (Addendum)
Orthopedic Tech Progress Note ?Patient Details:  ?Charles Gray ?Apr 27, 1955 ?[id number] ?Did have a concern about the heat coming from patient legs  ?Ortho Devices ?Type of Ortho Device: Unna boot ?Ortho Device/Splint Location: BLE ?Ortho Device/Splint Interventions: Ordered, Application, Adjustment ?  ?Post Interventions ?Patient Tolerated: Well ?Instructions Provided: Care of device ? ?Janit Pagan ?07/08/2021, 6:05 PM ? ?

## 2021-07-08 NOTE — ED Notes (Signed)
Pt at bedside eating breakfast.  ? ?Provided pt a beverage. ?

## 2021-07-08 NOTE — ED Notes (Signed)
Per MD do not need troponin ?

## 2021-07-08 NOTE — ED Notes (Signed)
Provided pt with a pillow ?

## 2021-07-08 NOTE — Assessment & Plan Note (Addendum)
COPD exacerbation resolved, patient will continue with bronchodilator therapy at home.  ? ?

## 2021-07-08 NOTE — ED Notes (Signed)
Ortho called for [employer] placement. ?

## 2021-07-08 NOTE — Assessment & Plan Note (Addendum)
Continue blood pressure control with metoprolol and diuresis with eplrenone and torsemide  ? ?

## 2021-07-09 ENCOUNTER — Observation Stay (HOSPITAL_BASED_OUTPATIENT_CLINIC_OR_DEPARTMENT_OTHER): Payer: Medicare HMO

## 2021-07-09 DIAGNOSIS — I50810 Right heart failure, unspecified: Secondary | ICD-10-CM

## 2021-07-09 DIAGNOSIS — I5033 Acute on chronic diastolic (congestive) heart failure: Secondary | ICD-10-CM

## 2021-07-09 DIAGNOSIS — I2699 Other pulmonary embolism without acute cor pulmonale: Secondary | ICD-10-CM

## 2021-07-09 LAB — APTT
aPTT: 100 seconds — ABNORMAL HIGH (ref 24–36)
aPTT: 99 seconds — ABNORMAL HIGH (ref 24–36)

## 2021-07-09 LAB — IRON AND TIBC
Iron: 27 ug/dL — ABNORMAL LOW (ref 45–182)
Saturation Ratios: 5 % — ABNORMAL LOW (ref 17.9–39.5)
TIBC: 514 ug/dL — ABNORMAL HIGH (ref 250–450)
UIBC: 487 ug/dL

## 2021-07-09 LAB — ECHOCARDIOGRAM COMPLETE
AR max vel: 2.69 cm2
AV Peak grad: 10.1 mmHg
Ao pk vel: 1.59 m/s
Area-P 1/2: 3.08 cm2
Calc EF: 62.2 %
Height: 82 in
S' Lateral: 4.4 cm
Single Plane A2C EF: 62.3 %
Single Plane A4C EF: 62.3 %
Weight: 6160 oz

## 2021-07-09 LAB — BASIC METABOLIC PANEL
Anion gap: 10 (ref 5–15)
BUN: 11 mg/dL (ref 8–23)
CO2: 31 mmol/L (ref 22–32)
Calcium: 8.7 mg/dL — ABNORMAL LOW (ref 8.9–10.3)
Chloride: 95 mmol/L — ABNORMAL LOW (ref 98–111)
Creatinine, Ser: 1.13 mg/dL (ref 0.61–1.24)
GFR, Estimated: >60 mL/min (ref >60)
Glucose, Bld: 172 mg/dL — ABNORMAL HIGH (ref 70–99)
Potassium: 3.1 mmol/L — ABNORMAL LOW (ref 3.5–5.1)
Sodium: 136 mmol/L (ref 135–145)

## 2021-07-09 LAB — CBC WITH DIFFERENTIAL/PLATELET
Abs Immature Granulocytes: 0.02 10*3/uL (ref 0.00–0.07)
Basophils Absolute: 0 10*3/uL (ref 0.0–0.1)
Basophils Relative: 0 %
Eosinophils Absolute: 0.2 10*3/uL (ref 0.0–0.5)
Eosinophils Relative: 3 %
HCT: 30.2 % — ABNORMAL LOW (ref 39.0–52.0)
Hemoglobin: 8.9 g/dL — ABNORMAL LOW (ref 13.0–17.0)
Immature Granulocytes: 0 %
Lymphocytes Relative: 18 %
Lymphs Abs: 1 10*3/uL (ref 0.7–4.0)
MCH: 22.1 pg — ABNORMAL LOW (ref 26.0–34.0)
MCHC: 29.5 g/dL — ABNORMAL LOW (ref 30.0–36.0)
MCV: 74.9 fL — ABNORMAL LOW (ref 80.0–100.0)
Monocytes Absolute: 0.5 10*3/uL (ref 0.1–1.0)
Monocytes Relative: 9 %
Neutro Abs: 3.8 10*3/uL (ref 1.7–7.7)
Neutrophils Relative %: 70 %
Platelets: 141 10*3/uL — ABNORMAL LOW (ref 150–400)
RBC: 4.03 MIL/uL — ABNORMAL LOW (ref 4.22–5.81)
RDW: 17.6 % — ABNORMAL HIGH (ref 11.5–15.5)
WBC: 5.5 10*3/uL (ref 4.0–10.5)
nRBC: 0 % (ref 0.0–0.2)

## 2021-07-09 LAB — COMPREHENSIVE METABOLIC PANEL
ALT: 16 U/L (ref 0–44)
AST: 33 U/L (ref 15–41)
Albumin: 2.8 g/dL — ABNORMAL LOW (ref 3.5–5.0)
Alkaline Phosphatase: 68 U/L (ref 38–126)
Anion gap: 8 (ref 5–15)
BUN: 12 mg/dL (ref 8–23)
CO2: 33 mmol/L — ABNORMAL HIGH (ref 22–32)
Calcium: 8.6 mg/dL — ABNORMAL LOW (ref 8.9–10.3)
Chloride: 96 mmol/L — ABNORMAL LOW (ref 98–111)
Creatinine, Ser: 1.06 mg/dL (ref 0.61–1.24)
GFR, Estimated: >60 mL/min (ref >60)
Glucose, Bld: 108 mg/dL — ABNORMAL HIGH (ref 70–99)
Potassium: 2.9 mmol/L — ABNORMAL LOW (ref 3.5–5.1)
Sodium: 137 mmol/L (ref 135–145)
Total Bilirubin: 1.1 mg/dL (ref 0.3–1.2)
Total Protein: 6.6 g/dL (ref 6.5–8.1)

## 2021-07-09 LAB — D-DIMER, QUANTITATIVE: D-Dimer, Quant: 0.5 ug/mL-FEU (ref 0.00–0.50)

## 2021-07-09 LAB — FERRITIN: Ferritin: 24 ng/mL (ref 24–336)

## 2021-07-09 LAB — MAGNESIUM: Magnesium: 2.2 mg/dL (ref 1.7–2.4)

## 2021-07-09 LAB — HEPARIN LEVEL (UNFRACTIONATED): Heparin Unfractionated: >1.1 IU/mL — ABNORMAL HIGH (ref 0.30–0.70)

## 2021-07-09 LAB — ANTITHROMBIN III: AntiThromb III Func: 62 % — ABNORMAL LOW (ref 75–120)

## 2021-07-09 MED ORDER — ACETAZOLAMIDE 250 MG PO TABS
250.0000 mg | ORAL_TABLET | Freq: Every day | ORAL | Status: DC
  Administered 2021-07-09: 250 mg via ORAL
  Filled 2021-07-09 (×4): qty 1

## 2021-07-09 MED ORDER — POTASSIUM CHLORIDE 10 MEQ/50ML IV SOLN
10.0000 meq | INTRAVENOUS | Status: AC
  Administered 2021-07-09 (×4): 10 meq via INTRAVENOUS
  Filled 2021-07-09 (×4): qty 50

## 2021-07-09 MED ORDER — METOLAZONE 5 MG PO TABS
2.5000 mg | ORAL_TABLET | ORAL | Status: DC
Start: 2021-07-09 — End: 2021-07-09

## 2021-07-09 MED ORDER — POTASSIUM CHLORIDE CRYS ER 20 MEQ PO TBCR
40.0000 meq | EXTENDED_RELEASE_TABLET | Freq: Once | ORAL | Status: AC
  Administered 2021-07-09: 40 meq via ORAL
  Filled 2021-07-09: qty 2

## 2021-07-09 MED ORDER — METOLAZONE 5 MG PO TABS
2.5000 mg | ORAL_TABLET | Freq: Once | ORAL | Status: AC
  Administered 2021-07-09: 2.5 mg via ORAL
  Filled 2021-07-09: qty 1

## 2021-07-09 MED ORDER — PERFLUTREN LIPID MICROSPHERE
1.0000 mL | INTRAVENOUS | Status: AC | PRN
  Administered 2021-07-09: 2 mL via INTRAVENOUS
  Filled 2021-07-09: qty 10

## 2021-07-09 MED ORDER — SODIUM CHLORIDE 0.9 % IV SOLN: INTRAVENOUS | Status: DC | PRN

## 2021-07-09 NOTE — Evaluation (Signed)
Physical Therapy Evaluation ?Patient Details ?Name: Charles Gray ?MRN: [id number] ?DOB: [birth year] ?Today's Date: 07/09/2021 ? ?History of Present Illness ? Pt is a 66 y.o. M who presents 07/07/2021 with heat failure with fluid overload.  CT scan this admit limited because of suboptimal opacification and artifact, however is suggestive of bilateral lower lobe segmental and subsegmental pulm emboli but no evidence of Rt heart strain. Follow up V/Q scan has been ordered. LE veous dopplers negative for DVT. Significant PMH: chronic diastolic heart failure, OSA, obesity, history of PE/DVT, PAF, HTN.  ?Clinical Impression ? PTA, pt lives in alone in a second floor apartment, is a limited household ambulator using a Rollator and requires assist for ADL's. Pt has an aide 5x/wk. Pt presents with decreased cardiopulmonary endurance, generalized weakness and impaired balance. Pt motivated to participate in therapy evaluation. Ambulating 80 ft with a walker at a supervision level; requires 2 standing rest breaks. HR 89-112 bpm, SpO2 96-98% on RA. Will benefit from HHPT follow up. ?   ? ?Recommendations for follow up therapy are one component of a multi-disciplinary discharge planning process, led by the attending physician.  Recommendations may be updated based on patient status, additional functional criteria and insurance authorization. ? ?Follow Up Recommendations Home health PT ? ?  ?Assistance Recommended at Discharge PRN  ?Patient can return home with the following ? A little help with bathing/dressing/bathroom;Assistance with cooking/housework;Assist for transportation;Help with stairs or ramp for entrance ? ?  ?Equipment Recommendations None recommended by PT  ?Recommendations for Other Services ?    ?  ?Functional Status Assessment Patient has had a recent decline in their functional status and demonstrates the ability to make significant improvements in function in a reasonable and predictable amount of time.  ? ?   ?Precautions / Restrictions Precautions ?Precautions: Fall ?Restrictions ?Weight Bearing Restrictions: No  ? ?  ? ?Mobility ? Bed Mobility ?  ?  ?  ?  ?  ?  ?  ?General bed mobility comments: Sitting EOB upon arrival ?  ? ?Transfers ?Overall transfer level: Modified independent ?  ?  ?  ?  ?  ?  ?  ?  ?  ?  ? ?Ambulation/Gait ?Ambulation/Gait assistance: Supervision ?Gait Distance (Feet): 80 Feet ?Assistive device: Rolling walker (2 wheels) ?Gait Pattern/deviations: Step-through pattern, Decreased stride length, Trunk flexed ?Gait velocity: decreased ?Gait velocity interpretation: <1.8 ft/sec, indicate of risk for recurrent falls ?  ?General Gait Details: Leaning down on forearms on walker x 2, fatigues easily, supervision for safety. decreased bilateral foot clearance ? ?Stairs ?  ?  ?  ?  ?  ? ?Wheelchair Mobility ?  ? ?Modified Rankin (Stroke Patients Only) ?  ? ?  ? ?Balance Overall balance assessment: Needs assistance ?Sitting-balance support: Feet supported ?Sitting balance-Leahy Scale: Good ?  ?  ?Standing balance support: During functional activity, Single extremity supported ?Standing balance-Leahy Scale: Poor ?Standing balance comment: reliant on at least single UE support ?  ?  ?  ?  ?  ?  ?  ?  ?  ?  ?  ?   ? ? ? ?Pertinent Vitals/Pain Pain Assessment ?Pain Assessment: No/denies pain  ? ? ?Home Living Family/patient expects to be discharged to:: Private residence ?Living Arrangements: Alone ?Available Help at Discharge: Personal care attendant;Neighbor;Available PRN/intermittently ?Type of Home: Apartment ?Home Access: Stairs to enter ?Entrance Stairs-Rails: Right;Left;Can reach both ?Entrance Stairs-Number of Steps: 20+ ?  ?Home Layout: One level ?Home Equipment: Conservation officer, nature (2 wheels);Rollator (4 wheels);Tub bench;Hand  held shower head;Toilet riser;Hospital bed ?Additional Comments: aide assist 3 hrs/daily, x5 days/week. on list to transfer to first floor apartment  ?  ?Prior Function Prior Level  of Function : Needs assist;History of Falls (last six months) ?  ?  ?  ?  ?  ?  ?Mobility Comments: use of Rollator for mobility. occasional assist with tub transfers with tub bench. Has to sit on stairs occasionally due to fatigue. x1 fall in past 6 months ?ADLs Comments: Able to complete ADLs typically without assist. occasional aide assist with tub transfers, showering and brushing hair due to fatigue. Aide assists with household IADLs ?  ? ? ?Hand Dominance  ? Dominant Hand: Right ? ?  ?Extremity/Trunk Assessment  ? Upper Extremity Assessment ?Upper Extremity Assessment: Defer to OT evaluation ?  ? ?Lower Extremity Assessment ?Lower Extremity Assessment: Generalized weakness;RLE deficits/detail;LLE deficits/detail ?RLE Deficits / Details: Unna boots donned ?LLE Deficits / Details: Unna boots donned ?  ? ?Cervical / Trunk Assessment ?Cervical / Trunk Assessment: Kyphotic  ?Communication  ? Communication: No difficulties  ?Cognition Arousal/Alertness: Awake/alert ?Behavior During Therapy: [facility] for tasks assessed/performed ?Overall Cognitive Status: Within Functional Limits for tasks assessed ?  ?  ?  ?  ?  ?  ?  ?  ?  ?  ?  ?  ?  ?  ?  ?  ?  ?  ?  ? ?  ?General Comments   ? ?  ?Exercises    ? ?Assessment/Plan  ?  ?PT Assessment Patient needs continued PT services  ?PT Problem List Decreased strength;Decreased activity tolerance;Decreased balance;Decreased mobility ? ?   ?  ?PT Treatment Interventions DME instruction;Gait training;Functional mobility training;Therapeutic activities;Therapeutic exercise;Balance training;Patient/family education;Stair training   ? ?PT Goals (Current goals can be found in the Care Plan section)  ?Acute Rehab PT Goals ?Patient Stated Goal: walk more ?PT Goal Formulation: With patient ?Time For Goal Achievement: 07/23/21 ?Potential to Achieve Goals: Good ? ?  ?Frequency Min 3X/week ?  ? ? ?Co-evaluation   ?  ?  ?  ?  ? ? ?  ?AM-PAC PT "6 Clicks" Mobility  ?Outcome Measure Help needed  turning from your back to your side while in a flat bed without using bedrails?: None ?Help needed moving from lying on your back to sitting on the side of a flat bed without using bedrails?: A Little ?Help needed moving to and from a bed to a chair (including a wheelchair)?: None ?Help needed standing up from a chair using your arms (e.g., wheelchair or bedside chair)?: A Little ?Help needed to walk in hospital room?: A Little ?Help needed climbing 3-5 steps with a railing? : A Lot ?6 Click Score: 19 ? ?  ?End of Session   ?Activity Tolerance: Patient tolerated treatment well ?Patient left: in bed;with call bell/phone within reach ?Nurse Communication: Mobility status ?PT Visit Diagnosis: Unsteadiness on feet (R26.81);Muscle weakness (generalized) (M62.81);Difficulty in walking, not elsewhere classified (R26.2) ?  ? ?Time: 3295-1884 ?PT Time Calculation (min) (ACUTE ONLY): 45 min ? ? ?Charges:   PT Evaluation ?$PT Eval Moderate Complexity: 1 Mod ?PT Treatments ?$Therapeutic Activity: 23-37 mins ?  ?   ? ? ?Wyona Almas, PT, DPT ?Acute Rehabilitation Services ?Pager [phone] ?Office [phone] ? ? ?Deno Etienne ?07/09/2021, 3:56 PM ? ?

## 2021-07-09 NOTE — Progress Notes (Signed)
ANTICOAGULATION CONSULT NOTE  ? ?Pharmacy Consult for Heparin  ?Indication: pulmonary embolus, afib ? ?Allergies  ?Allergen Reactions  ? Penicillins Other (See Comments)  ?  Caused convulsions as a child  ?Did it involve swelling of the face/tongue/throat, SOB, or low BP? N/A ?Did it involve sudden or severe rash/hives, skin peeling, or any reaction on the inside of your mouth or nose? N/A ?Did you need to seek medical attention at a hospital or doctor's office?N/A ?When did it last happen? N/A ?If all above answers are "NO", may proceed with cephalosporin use.  ? ? ?Vital Signs: ?Temp: 97.7 ?F (36.5 ?C) (03/29 5374) ?Temp Source: Oral (03/29 8270) ?BP: 107/55 (03/29 0829) ?Pulse Rate: 75 (03/29 0829) ? ?Labs: ?Recent Labs  ?  07/07/21 ?1616 07/07/21 ?2236 07/08/21 ?0003 07/08/21 ?1344 07/09/21 ?0451  ?HGB 10.1*  --   --  10.2* 8.9*  ?HCT 34.4*  --   --  36.3* 30.2*  ?PLT 179  --   --  162 141*  ?APTT  --   --   --  66* 100*  ?HEPARINUNFRC  --   --   --  >1.10* >1.10*  ?CREATININE 1.10  --   --   --  1.06  ?TROPONINIHS  --  7 6  --   --   ? ? ? ?Estimated Creatinine Clearance: 127.9 mL/min (by C-G formula based on SCr of 1.06 mg/dL). ? ? ?Medical History: ?Past Medical History:  ?Diagnosis Date  ? Acute on chronic congestive heart failure ([place])   ? Acute on chronic diastolic CHF (congestive heart failure) ([place]) 08/23/2019  ? Acute on chronic right-sided heart failure ([place]) 08/06/2019  ? Acute respiratory failure with hypoxia ([place])   ? Anemia of chronic disease   ? Atrial fibrillation ([place]) 12/23/2019  ? Atrial tachycardia ([place]) 08/12/2019  ? Benign essential HTN   ? Chest pain 08/23/2019  ? Chronic anticoagulation 12/26/2019  ? Chronic heart failure with preserved EF 05/30/2019  ? Echo 07/2019: EF 55-60, GR 1 DD  ? Chronic pain syndrome   ? Cirrhosis of liver without ascites ([place])   ? Class 2 severe obesity due to excess calories with serious comorbidity and body mass index (BMI) of 39.0 to 39.9 in adult [facility]) 05/30/2019  ?  Debility 03/13/2019  ? Drug induced constipation   ? Dyspnea   ? Fatty liver disease, nonalcoholic   ? GIB (gastrointestinal bleeding) 03/04/2019  ? Heart failure, systolic, acute ([place]) 7/86/7544  ? History of pulmonary embolism 08/12/2019  ? Hyperlipidemia 01/24/2019  ? Hypertension 01/24/2019  ? Hypoalbuminemia due to protein-calorie malnutrition ([place])   ? Hypokalemia   ? Hyponatremia   ? Hypotension due to drugs   ? Insomnia 01/24/2019  ? Lactic acidosis 08/23/2019  ? Lumbar disc herniation 01/24/2019  ? Mixed hyperlipidemia 01/24/2019  ? Morbid obesity with BMI of 40.0-44.9, adult ([place]) 05/30/2019  ? Normocytic anemia 06/08/2019  ? Occasional tremors 08/12/2019  ? OSA (obstructive sleep apnea) 08/12/2019  ? Palpitations 12/26/2019  ? Peripheral edema   ? Personal history of DVT (deep vein thrombosis) 12/26/2019  ? Portal hypertensive gastropathy ([place]) 03/06/2019  ? Seen on EGD 03/06/2019  ? Pulmonary emboli ([place]) 06/08/2019  ? Pulmonary embolism ([place]) 06/08/2019  ? Redness and swelling of lower leg 08/23/2019  ? Shock ([place])   ? Thrombocytopenia ([place])   ? ? ? ?Assessment: ?66 y/o M with palpitations and chest pain found to have possible PE on CT. Pt is on apixaban PTA for  history of VTE and afib. Last dose apixaban 3/28 with possible new PE started on heparin with no bolus. Hgb 10.1.  ? ?aPTT therapeutic at higher end of range at 100, HL still effected by apixiban, Hemoglobin dropped from 10.2 to 8.9. Platelets also down from 162 to 141. No issues with bleeding per RN. Will continue to monitor.  ? ?Goal of Therapy:  ?Heparin level 0.3-0.7 units/ml ?aPTT 66-102 seconds ?Monitor platelets by anticoagulation protocol: Yes ?  ?Plan:  ?Decrease heparin gtt to 2450 units/hr ?F/u aPTT/HL in 6 hours ?aPTT/HL and CBC daily while on heparin ?F/u ability to switch to Eliquis or if needs work up for coagulopathy ?Monitor for s/x of bleeding  ? ?Cathrine Muster, PharmD ?PGY2 Cardiology Pharmacy Resident ?Phone: [phone] ?07/09/2021   10:54 AM ? ?Please check [email or web address] for unit-specific pharmacy phone numbers. ? ? ? ?

## 2021-07-09 NOTE — Progress Notes (Signed)
?PROGRESS NOTE ? ?Charles Gray  ?DOB: 06/26/1955  ?PCP: Enid Skeens., MD ?QMG:[id number]  ?DOA: 07/07/2021 ? LOS: 0 days  ?Hospital Day: 3 ? ?Brief narrative: ?Charles Gray is a 66 y.o. male with PMH significant for morbid obesity, OSA, HTN, HLD, chronic diastolic CHF, history of DVT/PE, A-fib on anticoagulation, COPD, liver cirrhosis, chronic pain on opioids, chronic constipation ?Patient presented to ED on 3/27 with complaint of bilateral lower extremity redness and swelling, progressively worsening for several days also accompanied by increasing shortness of breath for last few days. ?He received a course of oral antibiotics as an outpatient for suspected leg cellulitis without improvement. ? ?In the ED, patient was afebrile, hemodynamically stable ?Labs with potassium low at 3.4, BUN/creatinine normal, lactic acid was initially elevated to 2.3, hemoglobin 10.1. ?He complained of chest discomfort. Troponin unremarkable ?CT angio of chest was limited because of suboptimal opacification and artifact.  It suspected bilateral lower lobe segmental and subsegmental pulm emboli.  Of note, patient is on scheduled Eliquis and reports compliance to it. ? ?Admitted to hospital service. ?  ?Subjective: ?Patient was seen and examined this morning. ?Feels slightly better than yesterday.  Able to get up and use the bathroom.   ?1.9 L urine output in last 24 hours.   ?Creatinine remained stable.  Potassium low at 2.9 this morning.   ? ?Principal Problem: ?  Acute pulmonary embolism ([place]) ?Active Problems: ?  Chronic diastolic HF (heart failure) ([place]) ?  Paroxysmal atrial fibrillation (H[facility]) ?  COPD with acute exacerbation ([place]) ?  Lymphedema ?  Essential hypertension ?  GERD without esophagitis ?  ? ?Assessment and Plan: ?Acute on Chronic diastolic CHF ?Essential hypertension ?-Patient reports progressively worsening bilateral lower extremity edema limiting his ability to move.   ?-DVT scan ruled out acute bilateral  DVT. ?-Patient has similar presentation in the past multiple times for CHF exacerbation ?-Cardiac consultation was obtained.  Patient was started on IV Lasix. ?-1.9 L urine output in last 24 hours.   ?-Last echocardiogram from July 2022 with EF 60 to 65%, mild concentric LVH ?-Repeat echo pending. ?-PTA, patient was on Toprol 25 mg daily, torsemide 100 mg twice daily, metolazone 2.5 mg daily MWF, eplerenone 25 mg daily ?-Continue Toprol, Aldactone along with IV Lasix. ?-Net IO Since Admission: -270.51 mL [07/09/21 1544] ?-Continue to monitor for daily intake output, weight, blood pressure, BNP, renal function and electrolytes. ?Recent Labs  ?Lab 07/07/21 ?1616 07/07/21 ?1827 07/07/21 ?2236 07/09/21 ?0451 07/09/21 ?1355  ?BNP  --   --  79.9  --   --   ?BUN 10  --   --  12 11  ?CREATININE 1.10  --   --  1.06 1.13  ?K 3.4*  --   --  2.9* 3.1*  ?MG  --  1.8  --  2.2  --   ? ?Acute on chronic hypokalemia ?-Potassium running low, 2.9 this morning.  Replacement given. ?-Traditionally, patient has been requiring aggressive potassium replacement.   ?Recent Labs  ?Lab 07/07/21 ?1616 07/07/21 ?1827 07/09/21 ?0451 07/09/21 ?1355  ?K 3.4*  --  2.9* 3.1*  ?MG  --  1.8 2.2  --   ?Acute pulmonary embolism ?History of DVT/PE on Eliquis ?-Patient has history of DVT/PE and was compliant with Eliquis at home.   ?-CT angio of chest was suboptimal and suspected bilateral lower lobe segmental and subsegmental PE. ?-Patient was switched to IV heparin drip on admission. ?-Negative DVT scan of both lower extremities. Unable to do  VQ scan because of his weight ?-At this time it is not clear if patient had new pulm embolism or the findings CT angio or artifact.  Patient's respirations are primary due to CHF.  He reports taking Eliquis consistently. ?-I do not think, this is a case of Eliquis failure. ?-On admission, patient was switched to heparin drip.  We will continue the same at this time.  If no intervention planned by cardiology, we can  switch him back to Eliquis. ? ?Paroxysmal atrial fibrillation ?-Currently rate controlled metoprolol. ?-Heparin drip  ?-Continue to monitor in telemetry ? ?Hyperlipidemia ?-Pravastatin 20 mg daily ? ?COPD  ?-Not in acute exacerbation  ?-Continue bronchodilators.   ? ?GERD without esophagitis ?-Continue PPI ? ?Chronic pain ?-PTA, patient was on Suboxone 1 tab sublingual twice daily, Neurontin 800 mg 3 times daily, ? ?Chronic constipation ?-PTA, patient was on lactulose 10 g 3 times daily, MiraLAX daily ? ?Anxiety/depression ?-Zoloft 50 mg daily ? ?BPH ?-0.4 mg daily ? ?Goals of care ?  Code Status: Full Code  ? ?Mobility: Encourage ambulation. PT eval ordered. ? ?Nutritional status:  ?Body mass index is 40.26 kg/m?.  ?  ?  ? ? ? ? ?Diet:  ?Diet Order   ? ?       ?  Diet Heart Room service appropriate? Yes; Fluid consistency: Thin  Diet effective now       ?  ? ?  ?  ? ?  ? ? ?DVT prophylaxis: Heparin drip ? ?Antimicrobials: None ?Fluid: None ?Consultants: Cardiology  ?Family Communication: Family not at bedside ? ?Status is: Observation ? ?Continue in-hospital care because: Needs volume management ?Level of care: Telemetry Medical  ? ?Dispo: The patient is from: Home ?             Anticipated d/c is to: Pending clinical course and PT eval ?             Patient currently is not medically stable to d/c. ?  Difficult to place patient No ? ? ? ? ?Infusions:  ? heparin 2,450 Units/hr (07/09/21 1500)  ? potassium chloride    ? ? ?Scheduled Meds: ? acetaZOLAMIDE  250 mg Oral Daily  ? buprenorphine-naloxone  1 tablet Sublingual Daily  ? Chlorhexidine Gluconate Cloth  6 each Topical Daily  ? furosemide  80 mg Intravenous BID  ? gabapentin  800 mg Oral TID  ? lactulose  10 g Oral TID  ? metolazone  2.5 mg Oral Once  ? metoprolol succinate  25 mg Oral QHS  ? pantoprazole  40 mg Oral BID AC  ? potassium chloride  40 mEq Oral BID  ? potassium chloride  40 mEq Oral Once  ? potassium chloride  40 mEq Oral Once  ? pravastatin  20 mg  Oral QHS  ? sertraline  50 mg Oral Daily  ? sodium chloride flush  10-40 mL Intracatheter Q12H  ? spironolactone  25 mg Oral Daily  ? tamsulosin  0.4 mg Oral Daily  ? ? ?PRN meds: ?acetaminophen **OR** acetaminophen, ipratropium-albuterol, ondansetron **OR** ondansetron (ZOFRAN) IV, polyethylene glycol, sodium chloride, sodium chloride flush, traMADol, zolpidem  ? ?Antimicrobials: ?Anti-infectives (From admission, onward)  ? ? None  ? ?  ? ? ?Objective: ?Vitals:  ? 07/09/21 0829 07/09/21 1156  ?BP: (!) 107/55 110/65  ?Pulse: 75 76  ?Resp: 20 20  ?Temp: 97.7 ?F (36.5 ?C) 98.1 ?F (36.7 ?C)  ?SpO2: 100% 97%  ? ? ?Intake/Output Summary (Last 24 hours) at 07/09/2021 1544 ?  Last data filed at 07/09/2021 1500 ?Gross per 24 hour  ?Intake 2249.49 ml  ?Output 2225 ml  ?Net 24.49 ml  ? ?Filed Weights  ? 07/08/21 0713  ?Weight: (!) 174.6 kg  ? ?Weight change:  ?Body mass index is 40.26 kg/m?.  ? ?Physical Exam: ?General exam: Pleasant elderly morbidly obese Caucasian male. ?Skin: No rashes, lesions or ulcers. ?HEENT: Atraumatic, normocephalic, no obvious bleeding ?Lungs: Clear to auscultation bilaterally  ?CVS: Regular rate and rhythm, no murmur ?GI/Abd soft, distended from obesity, nontender, bowel sound present ?CNS: Alert, awake, oriented x 3 ?Psychiatry: Sad affect. ?Extremities: Compression bandages on bilateral lower extremity pedal edema. ? ?Data Review: I have personally reviewed the laboratory data and studies available. ? ?F/u labs ordered  ?Unresulted Labs (From admission, onward)  ? ?  Start     Ordered  ? 07/10/21 5038  Basic metabolic panel  Daily,   R     ? 07/09/21 1104  ? 07/10/21 0500  CBC  Daily,   R     ? 07/09/21 1104  ? 07/10/21 0500  Magnesium  Daily,   R     ? 07/09/21 1104  ? 07/09/21 1523  Antithrombin III  (Hypercoagulable Panel, Comprehensive (PNL))  Once,   R       ?Question:  Specimen collection method  Answer:  Unit=Unit collect  ? 07/09/21 1523  ? 07/09/21 1523  Protein C activity  (Hypercoagulable  Panel, Comprehensive (PNL))  Once,   R       ?Question:  Specimen collection method  Answer:  Unit=Unit collect  ? 07/09/21 1523  ? 07/09/21 1523  Protein C, total  (Hypercoagulable Panel, Comprehensive

## 2021-07-09 NOTE — Progress Notes (Addendum)
? ? Advanced Heart Failure Rounding Note ? ?PCP-Cardiologist: Berniece Salines, DO  ?AHFC: Dr. [name]  ? ?Subjective:   ? ?1.9 L in UOP yesterday. Wt not charted yet for today. CVP currently disconnected from PICC. RN setting back up. CVP overnight ~14.  ? ?Scr stable, 1.06.  ? ?K 2.9  ?Mg 2.2  ? ?Repeat echo pending  ? ?Hgb drop from 10.2>>8.9  ? ?Feels slightly better. No current resting dyspnea. Unna boots applied.  ? ? ?Objective:   ?Weight Range: ?(!) 174.6 kg ?Body mass index is 40.26 kg/m?.  ? ?Vital Signs:   ?Temp:  [97.9 ?F (36.6 ?C)-98 ?F (36.7 ?C)] 97.9 ?F (36.6 ?C) (03/29 0435) ?Pulse Rate:  [65-98] 69 (03/29 0435) ?Resp:  [13-31] 18 (03/29 0435) ?BP: (90-149)/(61-89) 90/61 (03/29 0435) ?SpO2:  [93 %-100 %] 93 % (03/29 0435) ?Last BM Date :  (PTA) ? ?Weight change: ?Filed Weights  ? 07/08/21 0713  ?Weight: (!) 174.6 kg  ? ? ?Intake/Output:  ? ?Intake/Output Summary (Last 24 hours) at 07/09/2021 0758 ?Last data filed at 07/09/2021 0600 ?Gross per 24 hour  ?Intake 680 ml  ?Output 1520 ml  ?Net -840 ml  ?  ? ? ?Physical Exam  ?  ?General:  super morbidly obese. No resp difficulty ?HEENT: Normal ?Neck: Supple. JVP elevated to jaw . Carotids 2+ bilat; no bruits. No lymphadenopathy or thyromegaly appreciated. ?Cor: PMI nondisplaced. Regular rate & rhythm. No rubs, gallops or murmurs. ?Lungs: Clear ?Abdomen: Soft, nontender, nondistended. No hepatosplenomegaly. No bruits or masses. Good bowel sounds. ?Extremities: No cyanosis, clubbing, rash, 2+ b/l LE edema + UNNA boots + RUE PICC ?Neuro: Alert & orientedx3, cranial nerves grossly intact. moves all 4 extremities w/o difficulty. Affect pleasant ? ? ?Telemetry  ? ?NSR w/ PACs 70s, personally reviewed  ? ?EKG  ?  ?No new EKG to review  ? ?Labs  ?  ?CBC ?Recent Labs  ?  07/07/21 ?1616 07/08/21 ?1344 07/09/21 ?0451  ?WBC 9.3 7.4 5.5  ?NEUTROABS 7.6  --  3.8  ?HGB 10.1* 10.2* 8.9*  ?HCT 34.4* 36.3* 30.2*  ?MCV 74.6* 76.4* 74.9*  ?PLT 179 162 141*  ? ?Basic Metabolic  Panel ?Recent Labs  ?  07/07/21 ?1616 07/07/21 ?1827 07/09/21 ?0451  ?NA 138  --  137  ?K 3.4*  --  2.9*  ?CL 98  --  96*  ?CO2 30  --  33*  ?GLUCOSE 120*  --  108*  ?BUN 10  --  12  ?CREATININE 1.10  --  1.06  ?CALCIUM 8.8*  --  8.6*  ?MG  --  1.8 2.2  ? ?Liver Function Tests ?Recent Labs  ?  07/07/21 ?1616 07/09/21 ?0451  ?AST 34 33  ?ALT 18 16  ?ALKPHOS 79 68  ?BILITOT 1.1 1.1  ?PROT 7.5 6.6  ?ALBUMIN 3.2* 2.8*  ? ?No results for input(s): LIPASE, AMYLASE in the last 72 hours. ?Cardiac Enzymes ?No results for input(s): CKTOTAL, CKMB, CKMBINDEX, TROPONINI in the last 72 hours. ? ?BNP: ?BNP (last 3 results) ?Recent Labs  ?  12/21/20 ?0052 04/06/21 ?1454 07/07/21 ?2236  ?BNP 27.2 82.2 79.9  ? ? ?ProBNP (last 3 results) ?No results for input(s): PROBNP in the last 8760 hours. ? ? ?D-Dimer ?Recent Labs  ?  07/09/21 ?0451  ?DDIMER 0.50  ? ?Hemoglobin A1C ?No results for input(s): HGBA1C in the last 72 hours. ?Fasting Lipid Panel ?No results for input(s): CHOL, HDL, LDLCALC, TRIG, CHOLHDL, LDLDIRECT in the last 72 hours. ?Thyroid Function  Tests ?No results for input(s): TSH, T4TOTAL, T3FREE, THYROIDAB in the last 72 hours. ? ?Invalid input(s): FREET3 ? ?Other results: ? ? ?Imaging  ? ? ?VAS [place] LOWER EXTREMITY VENOUS (DVT) ? ?Result Date: 07/08/2021 ? Lower Venous DVT Study Patient Name:  DENNEY SHEIN  Date of Exam:   07/08/2021 Medical Rec #: [id number]        Accession #:    [id number] Date of Birth: October 22, 1955        Patient Gender: M Patient Age:   66 years Exam Location:  [facility] Procedure:      VAS [place] LOWER EXTREMITY VENOUS (DVT) Referring Phys: Inda Merlin --------------------------------------------------------------------------------  Indications: Edema, and Erythema.  Limitations: Poor ultrasound/tissue interface and body habitus. Comparison Study: 04/16/2020 negative right lower extremity venous duplex Performing Technologist: Maudry Mayhew MHA, RDMS, RVT, RDCS  Examination Guidelines: A  complete evaluation includes B-mode imaging, spectral Doppler, color Doppler, and power Doppler as needed of all accessible portions of each vessel. Bilateral testing is considered an integral part of a complete examination. Limited examinations for reoccurring indications may be performed as noted. The reflux portion of the exam is performed with the patient in reverse Trendelenburg.  +---------+---------------+---------+-----------+---------------+--------------+ RIGHT    CompressibilityPhasicitySpontaneityProperties     Thrombus Aging +---------+---------------+---------+-----------+---------------+--------------+ CFV      Full           Yes      Yes                                      +---------+---------------+---------+-----------+---------------+--------------+ SFJ      Full                                                             +---------+---------------+---------+-----------+---------------+--------------+ FV Prox  Full                                                             +---------+---------------+---------+-----------+---------------+--------------+ FV Mid   Full                                                             +---------+---------------+---------+-----------+---------------+--------------+ FV DistalFull                                                             +---------+---------------+---------+-----------+---------------+--------------+ PFV      Full                                                             +---------+---------------+---------+-----------+---------------+--------------+  POP      Full           Yes      Yes                                      +---------+---------------+---------+-----------+---------------+--------------+ PTV      Full                               Patent by color                                                           Doppler                        +---------+---------------+---------+-----------+---------------+--------------+ PERO     Full                               Patent by color                                                           Doppler                       +---------+---------------+---------+-----------+---------------+--------------+   +---------+---------------+---------+-----------+---------------+--------------+ LEFT     CompressibilityPhasicitySpontaneityProperties     Thrombus Aging +---------+---------------+---------+-----------+---------------+--------------+ CFV      Full           Yes      Yes                                      +---------+---------------+---------+-----------+---------------+--------------+ SFJ      Full                                                             +---------+---------------+---------+-----------+---------------+--------------+ FV Prox  Full                                                             +---------+---------------+---------+-----------+---------------+--------------+ FV Mid   Full                                                             +---------+---------------+---------+-----------+---------------+--------------+ FV DistalFull                                                             +---------+---------------+---------+-----------+---------------+--------------+  PFV      Full                                                             +---------+---------------+---------+-----------+---------------+--------------+ POP      Full           Yes      Yes                                      +---------+---------------+---------+-----------+---------------+--------------+ PTV      Full                               Patent by color                                                           Doppler                       +---------+---------------+---------+-----------+---------------+--------------+  PERO     Full                               Patent by color                                                           Doppler                       +---------+---------------+---------+-----------+---------------+--------------+     Summary: RIGHT: - There is no evidence of deep vein thrombosis in the lower extremity.  - No cystic structure found in the popliteal fossa.  LEFT: - Ther

## 2021-07-09 NOTE — Care Management Obs Status (Signed)
MEDICARE OBSERVATION STATUS NOTIFICATION ? ? ?Patient Details  ?Name: Charles Gray ?MRN: [id number] ?Date of Birth: 12-19-1955 ? ? ?Medicare Observation Status Notification Given:  Yes ? ? ? ?Carles Collet, RN ?07/09/2021, 10:26 AM ?

## 2021-07-09 NOTE — Progress Notes (Signed)
ANTICOAGULATION CONSULT NOTE  ? ?Pharmacy Consult for Heparin  ?Indication: pulmonary embolus, afib ? ?Allergies  ?Allergen Reactions  ? Penicillins Other (See Comments)  ?  Caused convulsions as a child  ?Did it involve swelling of the face/tongue/throat, SOB, or low BP? N/A ?Did it involve sudden or severe rash/hives, skin peeling, or any reaction on the inside of your mouth or nose? N/A ?Did you need to seek medical attention at a hospital or doctor's office?N/A ?When did it last happen? N/A ?If all above answers are "NO", may proceed with cephalosporin use.  ? ? ?Vital Signs: ?Temp: 97.9 ?F (36.6 ?C) (03/29 1640) ?Temp Source: Oral (03/29 1640) ?BP: 105/58 (03/29 1640) ?Pulse Rate: 72 (03/29 1640) ? ?Labs: ?Recent Labs  ?  07/07/21 ?1616 07/07/21 ?2236 07/08/21 ?0003 07/08/21 ?1344 07/09/21 ?0451 07/09/21 ?1355 07/09/21 ?1500  ?HGB 10.1*  --   --  10.2* 8.9*  --   --   ?HCT 34.4*  --   --  36.3* 30.2*  --   --   ?PLT 179  --   --  162 141*  --   --   ?APTT  --   --   --  66* 100*  --  99*  ?HEPARINUNFRC  --   --   --  >1.10* >1.10*  --   --   ?CREATININE 1.10  --   --   --  1.06 1.13  --   ?TROPONINIHS  --  7 6  --   --   --   --   ? ? ? ?Estimated Creatinine Clearance: 120 mL/min (by C-G formula based on SCr of 1.13 mg/dL). ? ? ?Medical History: ?Past Medical History:  ?Diagnosis Date  ? Acute on chronic congestive heart failure ([place])   ? Acute on chronic diastolic CHF (congestive heart failure) ([place]) 08/23/2019  ? Acute on chronic right-sided heart failure ([place]) 08/06/2019  ? Acute respiratory failure with hypoxia ([place])   ? Anemia of chronic disease   ? Atrial fibrillation ([place]) 12/23/2019  ? Atrial tachycardia ([place]) 08/12/2019  ? Benign essential HTN   ? Chest pain 08/23/2019  ? Chronic anticoagulation 12/26/2019  ? Chronic heart failure with preserved EF 05/30/2019  ? Echo 07/2019: EF 55-60, GR 1 DD  ? Chronic pain syndrome   ? Cirrhosis of liver without ascites ([place])   ? Class 2 severe obesity due to excess  calories with serious comorbidity and body mass index (BMI) of 39.0 to 39.9 in adult [facility]) 05/30/2019  ? Debility 03/13/2019  ? Drug induced constipation   ? Dyspnea   ? Fatty liver disease, nonalcoholic   ? GIB (gastrointestinal bleeding) 03/04/2019  ? Heart failure, systolic, acute ([place]) 0/73/7106  ? History of pulmonary embolism 08/12/2019  ? Hyperlipidemia 01/24/2019  ? Hypertension 01/24/2019  ? Hypoalbuminemia due to protein-calorie malnutrition ([place])   ? Hypokalemia   ? Hyponatremia   ? Hypotension due to drugs   ? Insomnia 01/24/2019  ? Lactic acidosis 08/23/2019  ? Lumbar disc herniation 01/24/2019  ? Mixed hyperlipidemia 01/24/2019  ? Morbid obesity with BMI of 40.0-44.9, adult ([place]) 05/30/2019  ? Normocytic anemia 06/08/2019  ? Occasional tremors 08/12/2019  ? OSA (obstructive sleep apnea) 08/12/2019  ? Palpitations 12/26/2019  ? Peripheral edema   ? Personal history of DVT (deep vein thrombosis) 12/26/2019  ? Portal hypertensive gastropathy ([place]) 03/06/2019  ? Seen on EGD 03/06/2019  ? Pulmonary emboli ([place]) 06/08/2019  ? Pulmonary embolism ([place]) 06/08/2019  ? Redness  and swelling of lower leg 08/23/2019  ? Shock ([place])   ? Thrombocytopenia ([place])   ? ? ? ?Assessment: ?66 y/o M with palpitations and chest pain found to have possible PE on CT. Pt is on apixaban PTA for history of VTE and afib. Last dose apixaban 3/28 with possible new PE started on heparin with no bolus. Hgb 10.1.  ? ?aPTT remains therapeutic at higher end of range at 99 ? ?Goal of Therapy:  ?Heparin level 0.3-0.7 units/ml ?aPTT 66-102 seconds ?Monitor platelets by anticoagulation protocol: Yes ?  ?Plan:  ?Decrease heparin gtt to 2400 units/hr ?aPTT/HL and CBC daily while on heparin ?F/u ability to switch to Eliquis or if needs work up for coagulopathy ? ? ?Hildred Laser, PharmD ?Clinical Pharmacist ?**Pharmacist phone directory can now be found on [email or web address] ([email or web address]).  Listed under [place]. ? ? ? ?

## 2021-07-10 DIAGNOSIS — I5043 Acute on chronic combined systolic (congestive) and diastolic (congestive) heart failure: Secondary | ICD-10-CM | POA: Diagnosis not present

## 2021-07-10 DIAGNOSIS — I2699 Other pulmonary embolism without acute cor pulmonale: Secondary | ICD-10-CM | POA: Diagnosis not present

## 2021-07-10 LAB — CARDIOLIPIN ANTIBODIES, IGG, IGM, IGA
Anticardiolipin IgA: <9 APL U/mL (ref 0–11)
Anticardiolipin IgG: <9 GPL U/mL (ref 0–14)
Anticardiolipin IgM: 14 MPL U/mL — ABNORMAL HIGH (ref 0–12)

## 2021-07-10 LAB — BASIC METABOLIC PANEL
Anion gap: 9 (ref 5–15)
BUN: 10 mg/dL (ref 8–23)
CO2: 29 mmol/L (ref 22–32)
Calcium: 8.9 mg/dL (ref 8.9–10.3)
Chloride: 100 mmol/L (ref 98–111)
Creatinine, Ser: 1.12 mg/dL (ref 0.61–1.24)
GFR, Estimated: >60 mL/min (ref >60)
Glucose, Bld: 127 mg/dL — ABNORMAL HIGH (ref 70–99)
Potassium: 3.9 mmol/L (ref 3.5–5.1)
Sodium: 138 mmol/L (ref 135–145)

## 2021-07-10 LAB — CBC
HCT: 32 % — ABNORMAL LOW (ref 39.0–52.0)
Hemoglobin: 9.1 g/dL — ABNORMAL LOW (ref 13.0–17.0)
MCH: 21.5 pg — ABNORMAL LOW (ref 26.0–34.0)
MCHC: 28.4 g/dL — ABNORMAL LOW (ref 30.0–36.0)
MCV: 75.7 fL — ABNORMAL LOW (ref 80.0–100.0)
Platelets: 133 10*3/uL — ABNORMAL LOW (ref 150–400)
RBC: 4.23 MIL/uL (ref 4.22–5.81)
RDW: 17.7 % — ABNORMAL HIGH (ref 11.5–15.5)
WBC: 5.7 10*3/uL (ref 4.0–10.5)
nRBC: 0 % (ref 0.0–0.2)

## 2021-07-10 LAB — URINE CULTURE: Culture: 2000 — AB

## 2021-07-10 LAB — HEPARIN LEVEL (UNFRACTIONATED): Heparin Unfractionated: 0.65 IU/mL (ref 0.30–0.70)

## 2021-07-10 LAB — HEMOGLOBIN A1C
Hgb A1c MFr Bld: 5.9 % — ABNORMAL HIGH (ref 4.8–5.6)
Mean Plasma Glucose: 122.63 mg/dL

## 2021-07-10 LAB — GLUCOSE, CAPILLARY: Glucose-Capillary: 140 mg/dL — ABNORMAL HIGH (ref 70–99)

## 2021-07-10 LAB — MAGNESIUM: Magnesium: 2.2 mg/dL (ref 1.7–2.4)

## 2021-07-10 LAB — HOMOCYSTEINE: Homocysteine: 16.1 umol/L (ref 0.0–17.2)

## 2021-07-10 LAB — APTT: aPTT: 110 seconds — ABNORMAL HIGH (ref 24–36)

## 2021-07-10 MED ORDER — METOLAZONE 5 MG PO TABS
2.5000 mg | ORAL_TABLET | Freq: Two times a day (BID) | ORAL | Status: DC
  Administered 2021-07-10 – 2021-07-11 (×3): 2.5 mg via ORAL
  Filled 2021-07-10 (×2): qty 1

## 2021-07-10 MED ORDER — POTASSIUM CHLORIDE CRYS ER 20 MEQ PO TBCR
40.0000 meq | EXTENDED_RELEASE_TABLET | Freq: Three times a day (TID) | ORAL | Status: DC
  Administered 2021-07-10 (×3): 40 meq via ORAL
  Filled 2021-07-10 (×3): qty 2

## 2021-07-10 MED ORDER — INSULIN ASPART 100 UNIT/ML IJ SOLN
0.0000 [IU] | Freq: Three times a day (TID) | INTRAMUSCULAR | Status: DC
  Administered 2021-07-11 – 2021-07-12 (×3): 2 [IU] via SUBCUTANEOUS
  Administered 2021-07-12: 3 [IU] via SUBCUTANEOUS
  Administered 2021-07-13: 2 [IU] via SUBCUTANEOUS
  Administered 2021-07-13: 5 [IU] via SUBCUTANEOUS
  Administered 2021-07-13 – 2021-07-16 (×7): 2 [IU] via SUBCUTANEOUS
  Administered 2021-07-17: 3 [IU] via SUBCUTANEOUS

## 2021-07-10 MED ORDER — SODIUM CHLORIDE 0.9 % IV SOLN
510.0000 mg | Freq: Once | INTRAVENOUS | Status: AC
  Administered 2021-07-10: 510 mg via INTRAVENOUS
  Filled 2021-07-10: qty 17

## 2021-07-10 MED ORDER — APIXABAN 5 MG PO TABS
5.0000 mg | ORAL_TABLET | Freq: Two times a day (BID) | ORAL | Status: DC
  Filled 2021-07-10: qty 1

## 2021-07-10 MED ORDER — APIXABAN 5 MG PO TABS
10.0000 mg | ORAL_TABLET | Freq: Two times a day (BID) | ORAL | Status: AC
  Administered 2021-07-10 – 2021-07-17 (×14): 10 mg via ORAL
  Filled 2021-07-10 (×14): qty 2

## 2021-07-10 MED ORDER — METOLAZONE 5 MG PO TABS
2.5000 mg | ORAL_TABLET | Freq: Every day | ORAL | Status: DC
  Filled 2021-07-10: qty 1

## 2021-07-10 MED ORDER — INSULIN ASPART 100 UNIT/ML IJ SOLN: 0.0000 [IU] | Freq: Every day | INTRAMUSCULAR | Status: DC

## 2021-07-10 NOTE — Progress Notes (Addendum)
ANTICOAGULATION CONSULT NOTE  ? ?Pharmacy Consult for Heparin  ?Indication: pulmonary embolus, afib ? ?Allergies  ?Allergen Reactions  ? Penicillins Other (See Comments)  ?  Caused convulsions as a child  ?Did it involve swelling of the face/tongue/throat, SOB, or low BP? N/A ?Did it involve sudden or severe rash/hives, skin peeling, or any reaction on the inside of your mouth or nose? N/A ?Did you need to seek medical attention at a hospital or doctor's office?N/A ?When did it last happen? N/A ?If all above answers are "NO", may proceed with cephalosporin use.  ? ? ?Vital Signs: ?Temp: 98.8 ?F (37.1 ?C) (03/30 4680) ?Temp Source: Oral (03/30 3212) ?BP: 97/62 (03/30 2482) ?Pulse Rate: 111 (03/30 5003) ?Heparin dosing weight: 179 kg ? ?Labs: ?Recent Labs  ?  07/07/21 ?1616 07/07/21 ?2236 07/08/21 ?0003 07/08/21 ?1344 07/09/21 ?0451 07/09/21 ?1355 07/09/21 ?1500 07/10/21 ?0440  ?HGB  --   --   --  10.2* 8.9*  --   --  9.1*  ?HCT  --   --   --  36.3* 30.2*  --   --  32.0*  ?PLT  --   --   --  162 141*  --   --  133*  ?APTT   < >  --   --  66* 100*  --  99* 110*  ?HEPARINUNFRC  --   --   --  >1.10* >1.10*  --   --  0.65  ?CREATININE  --   --   --   --  1.06 1.13  --  1.12  ?TROPONINIHS  --  7 6  --   --   --   --   --   ? < > = values in this interval not displayed.  ? ? ? ?Estimated Creatinine Clearance: 122.8 mL/min (by C-G formula based on SCr of 1.12 mg/dL). ? ? ?Medical History: ?Past Medical History:  ?Diagnosis Date  ? Acute on chronic congestive heart failure ([place])   ? Acute on chronic diastolic CHF (congestive heart failure) ([place]) 08/23/2019  ? Acute on chronic right-sided heart failure ([place]) 08/06/2019  ? Acute respiratory failure with hypoxia ([place])   ? Anemia of chronic disease   ? Atrial fibrillation ([place]) 12/23/2019  ? Atrial tachycardia ([place]) 08/12/2019  ? Benign essential HTN   ? Chest pain 08/23/2019  ? Chronic anticoagulation 12/26/2019  ? Chronic heart failure with preserved EF 05/30/2019  ? Echo 07/2019: EF  55-60, GR 1 DD  ? Chronic pain syndrome   ? Cirrhosis of liver without ascites ([place])   ? Class 2 severe obesity due to excess calories with serious comorbidity and body mass index (BMI) of 39.0 to 39.9 in adult [facility]) 05/30/2019  ? Debility 03/13/2019  ? Drug induced constipation   ? Dyspnea   ? Fatty liver disease, nonalcoholic   ? GIB (gastrointestinal bleeding) 03/04/2019  ? Heart failure, systolic, acute ([place]) 10/15/8887  ? History of pulmonary embolism 08/12/2019  ? Hyperlipidemia 01/24/2019  ? Hypertension 01/24/2019  ? Hypoalbuminemia due to protein-calorie malnutrition ([place])   ? Hypokalemia   ? Hyponatremia   ? Hypotension due to drugs   ? Insomnia 01/24/2019  ? Lactic acidosis 08/23/2019  ? Lumbar disc herniation 01/24/2019  ? Mixed hyperlipidemia 01/24/2019  ? Morbid obesity with BMI of 40.0-44.9, adult ([place]) 05/30/2019  ? Normocytic anemia 06/08/2019  ? Occasional tremors 08/12/2019  ? OSA (obstructive sleep apnea) 08/12/2019  ? Palpitations 12/26/2019  ? Peripheral edema   ?  Personal history of DVT (deep vein thrombosis) 12/26/2019  ? Portal hypertensive gastropathy ([place]) 03/06/2019  ? Seen on EGD 03/06/2019  ? Pulmonary emboli ([place]) 06/08/2019  ? Pulmonary embolism ([place]) 06/08/2019  ? Redness and swelling of lower leg 08/23/2019  ? Shock ([place])   ? Thrombocytopenia ([place])   ? ? ? ?Assessment: ?66 y/o M with palpitations and chest pain found to have possible PE on CT. Pt is on apixaban PTA for history of VTE and afib. Last dose apixaban 3/28 with possible new PE started on heparin with no bolus. Hgb 10.1.  ? ?aPTT this morning supra therapeutic at 110, despite HL now in range still think it is effected by apixiban and will adjust based off aPTT. CBC stable. No s/x of bleeding per RN. ? ? ?Goal of Therapy:  ?Heparin level 0.3-0.7 units/ml ?aPTT 66-102 seconds ?Monitor platelets by anticoagulation protocol: Yes ?  ?Plan:  ?Decrease heparin gtt to 2300 units/hr ?6 hour aPTT/HL  ?aPTT/HL and CBC daily while on heparin ?F/u  ability to switch to Eliquis after coagulopathy w/u results ? ? ?Charles Gray, PharmD ?PGY2 Cardiology Pharmacy Resident ?Phone: [phone] ?07/10/2021  11:39 AM ? ?Please check [email or web address] for unit-specific pharmacy phone numbers. ? ? ? ? ?

## 2021-07-10 NOTE — Plan of Care (Signed)
  Problem: Health Behavior/Discharge Planning: Goal: Ability to manage health-related needs will improve Outcome: Not Progressing   

## 2021-07-10 NOTE — Progress Notes (Signed)
ANTICOAGULATION CONSULT NOTE ? ?Pharmacy Consult for Apixaban ?Indication: atrial fibrillation and pulmonary embolus ? ?Allergies  ?Allergen Reactions  ? Penicillins Other (See Comments)  ?  Caused convulsions as a child  ?Did it involve swelling of the face/tongue/throat, SOB, or low BP? N/A ?Did it involve sudden or severe rash/hives, skin peeling, or any reaction on the inside of your mouth or nose? N/A ?Did you need to seek medical attention at a hospital or doctor's office?N/A ?When did it last happen? N/A ?If all above answers are "NO", may proceed with cephalosporin use.  ? ? ?Patient Measurements: ?Height: 6' 10"  (208.3 cm) ?Weight: (!) 179.2 kg (395 lb 1.6 oz) ?IBW/kg (Calculated) : 100.6 ? ?Vital Signs: ?Temp: 98.8 ?F (37.1 ?C) (03/30 9774) ?Temp Source: Oral (03/30 1423) ?BP: 97/62 (03/30 9532) ?Pulse Rate: 111 (03/30 0233) ? ?Labs: ?Recent Labs  ?  07/07/21 ?1616 07/07/21 ?2236 07/08/21 ?0003 07/08/21 ?1344 07/09/21 ?0451 07/09/21 ?1355 07/09/21 ?1500 07/10/21 ?0440  ?HGB  --   --   --  10.2* 8.9*  --   --  9.1*  ?HCT  --   --   --  36.3* 30.2*  --   --  32.0*  ?PLT  --   --   --  162 141*  --   --  133*  ?APTT   < >  --   --  66* 100*  --  99* 110*  ?HEPARINUNFRC  --   --   --  >1.10* >1.10*  --   --  0.65  ?CREATININE  --   --   --   --  1.06 1.13  --  1.12  ?TROPONINIHS  --  7 6  --   --   --   --   --   ? < > = values in this interval not displayed.  ? ? ?Estimated Creatinine Clearance: 122.8 mL/min (by C-G formula based on SCr of 1.12 mg/dL). ? ? ?Assessment: ?66 y/o M with palpitations and chest pain found to have possible PE on CT. Pt is on apixaban PTA for history of VTE and afib. Last dose apixaban 3/28 (possible medication non-adherence noted during medication history) with possible new PE started on heparin with no bolus. Patient was started on IV heparin and now is being transitioned back to apixaban. CBC stable. No s/x of bleeding per RN. ? ? ?Goal of Therapy:  ?Monitor platelets by  anticoagulation protocol: Yes ?  ?Plan:  ?Stop heparin infusion at 4PM ?Start apixaban 6m twice daily x7 days, followed by 567mBID, with first dose at 4PM ?Continue to monitor H&H and platelets ? ? ? ?Thank you for allowing pharmacy to be a part of this patient?s care. ? ?AdArdyth HarpsPharmD ?Clinical Pharmacist ? ?

## 2021-07-10 NOTE — Progress Notes (Addendum)
? ? Advanced Heart Failure Rounding Note ? ?PCP-Cardiologist: Berniece Salines, DO  ?AHFC: Dr. [name]  ? ?Subjective:   ? ?Yesterday diuresed with IV lasix + diamox. He did not receive metolazone. Negative 1 liter. Weight up 10 pounds? Says that is the first time he was weighed.  ? ?Complaining fatigue. Denies SOB.  ? ? ?Objective:   ?Weight Range: ?(!) 179.2 kg ?Body mass index is 41.31 kg/m?.  ? ?Vital Signs:   ?Temp:  [97.7 ?F (36.5 ?C)-98.8 ?F (37.1 ?C)] 98.8 ?F (37.1 ?C) (03/30 1856) ?Pulse Rate:  [68-111] 111 (03/30 3149) ?Resp:  [19-20] 19 (03/29 2315) ?BP: (97-154)/(58-98) 97/62 (03/30 7026) ?SpO2:  [92 %-100 %] 92 % (03/30 0822) ?Weight:  [179.2 kg] 179.2 kg (03/30 0501) ?Last BM Date : 07/08/21 ? ?Weight change: ?Filed Weights  ? 07/08/21 0713 07/10/21 0501  ?Weight: (!) 174.6 kg (!) 179.2 kg  ? ? ?Intake/Output:  ? ?Intake/Output Summary (Last 24 hours) at 07/10/2021 0836 ?Last data filed at 07/10/2021 0246 ?Gross per 24 hour  ?Intake 2251.61 ml  ?Output 3300 ml  ?Net -1048.39 ml  ?  ? ? ?Physical Exam  ? CVP 15-16  ?General:  Obese. In bed.  No resp difficulty ?HEENT: normal ?Neck: supple. JVP to jaw. Carotids 2+ bilat; no bruits. No lymphadenopathy or thryomegaly appreciated. ?Cor: PMI nondisplaced. Regular rate & rhythm. No rubs, gallops or murmurs. ?Lungs: clear ?Abdomen: soft, nontender, nondistended. No hepatosplenomegaly. No bruits or masses. Good bowel sounds. ?Extremities: no cyanosis, clubbing, rash, R and LLE unna boots.  RUE PICC  ?Neuro: alert & orientedx3, cranial nerves grossly intact. moves all 4 extremities w/o difficulty. Affect pleasant ? ?Telemetry  ? ?SR/SB 50-70s  ? ?EKG  ?  ?No new EKG to review  ? ?Labs  ?  ?CBC ?Recent Labs  ?  07/07/21 ?1616 07/08/21 ?1344 07/09/21 ?0451 07/10/21 ?0440  ?WBC 9.3   < > 5.5 5.7  ?NEUTROABS 7.6  --  3.8  --   ?HGB 10.1*   < > 8.9* 9.1*  ?HCT 34.4*   < > 30.2* 32.0*  ?MCV 74.6*   < > 74.9* 75.7*  ?PLT 179   < > 141* 133*  ? < > = values in this interval  not displayed.  ? ?Basic Metabolic Panel ?Recent Labs  ?  07/09/21 ?0451 07/09/21 ?1355 07/10/21 ?0440  ?NA 137 136 138  ?K 2.9* 3.1* 3.9  ?CL 96* 95* 100  ?CO2 33* 31 29  ?GLUCOSE 108* 172* 127*  ?BUN 12 11 10   ?CREATININE 1.06 1.13 1.12  ?CALCIUM 8.6* 8.7* 8.9  ?MG 2.2  --  2.2  ? ?Liver Function Tests ?Recent Labs  ?  07/07/21 ?1616 07/09/21 ?0451  ?AST 34 33  ?ALT 18 16  ?ALKPHOS 79 68  ?BILITOT 1.1 1.1  ?PROT 7.5 6.6  ?ALBUMIN 3.2* 2.8*  ? ?No results for input(s): LIPASE, AMYLASE in the last 72 hours. ?Cardiac Enzymes ?No results for input(s): CKTOTAL, CKMB, CKMBINDEX, TROPONINI in the last 72 hours. ? ?BNP: ?BNP (last 3 results) ?Recent Labs  ?  12/21/20 ?0052 04/06/21 ?1454 07/07/21 ?2236  ?BNP 27.2 82.2 79.9  ? ? ?ProBNP (last 3 results) ?No results for input(s): PROBNP in the last 8760 hours. ? ? ?D-Dimer ?Recent Labs  ?  07/09/21 ?0451  ?DDIMER 0.50  ? ?Hemoglobin A1C ?No results for input(s): HGBA1C in the last 72 hours. ?Fasting Lipid Panel ?No results for input(s): CHOL, HDL, LDLCALC, TRIG, CHOLHDL, LDLDIRECT in the last 72  hours. ?Thyroid Function Tests ?No results for input(s): TSH, T4TOTAL, T3FREE, THYROIDAB in the last 72 hours. ? ?Invalid input(s): FREET3 ? ?Other results: ? ? ?Imaging  ? ? ?ECHOCARDIOGRAM COMPLETE ? ?Result Date: 07/09/2021 ?   ECHOCARDIOGRAM REPORT   Patient Name:   Charles Gray Date of Exam: 07/09/2021 Medical Rec #:  [id number]       Height:       82.0 in Accession #:    [id number]      Weight:       385.0 lb Date of Birth:  Jan 25, 1956       BSA:          3.093 m? Patient Age:    66 years        BP:           123/75 mmHg Patient Gender: M               HR:           77 bpm. Exam Location:  Inpatient Procedure: 2D Echo, Cardiac Doppler, Color Doppler and Intracardiac            Opacification Agent Indications:    CHF  History:        Patient has prior history of Echocardiogram examinations. CHF,                 COPD, Arrythmias:Atrial Fibrillation; Risk Factors:Hypertension                  and Sleep Apnea.  Sonographer:    Jyl Heinz Referring Phys: [id number] SIMMONS  Sonographer Comments: Technically difficult study due to poor echo windows. Image acquisition challenging due to patient body habitus. IMPRESSIONS  1. Left ventricular ejection fraction, by estimation, is 60 to 65%. The left ventricle has normal function. The left ventricle has no regional wall motion abnormalities. The left ventricular internal cavity size was moderately to severely dilated. There  is mild left ventricular hypertrophy. Left ventricular diastolic parameters were normal.  2. Right ventricular systolic function is normal. The right ventricular size is normal. Tricuspid regurgitation signal is inadequate for assessing PA pressure.  3. Left atrial size was mildly dilated.  4. The mitral valve is grossly normal. Trivial mitral valve regurgitation. No evidence of mitral stenosis.  5. The aortic valve is grossly normal. There is mild calcification of the aortic valve. Aortic valve regurgitation is not visualized. No aortic stenosis is present.  6. The inferior vena cava is normal in size with <50% respiratory variability, suggesting right atrial pressure of 8 mmHg. FINDINGS  Left Ventricle: Left ventricular ejection fraction, by estimation, is 60 to 65%. The left ventricle has normal function. The left ventricle has no regional wall motion abnormalities. Definity contrast agent was given IV to delineate the left ventricular  endocardial borders. The left ventricular internal cavity size was moderately to severely dilated. There is mild left ventricular hypertrophy. Left ventricular diastolic parameters were normal. Right Ventricle: The right ventricular size is normal. Right vetricular wall thickness was not assessed. Right ventricular systolic function is normal. Tricuspid regurgitation signal is inadequate for assessing PA pressure. Left Atrium: Left atrial size was mildly dilated. Right Atrium: Right  atrial size was normal in size. Pericardium: There is no evidence of pericardial effusion. Mitral Valve: The mitral valve is grossly normal. Trivial mitral valve regurgitation. No evidence of mitral valve stenosis. Tricuspid Valve: The tricuspid valve is not well visualized. Tricuspid valve regurgitation is trivial. Aortic Valve: The aortic  valve is grossly normal. There is mild calcification of the aortic valve. Aortic valve regurgitation is not visualized. No aortic stenosis is present. Aortic valve peak gradient measures 10.1 mmHg. Pulmonic Valve: The pulmonic valve was normal in structure. Pulmonic valve regurgitation is trivial. No evidence of pulmonic stenosis. Aorta: The aortic root is normal in size and structure. Venous: The inferior vena cava is normal in size with less than 50% respiratory variability, suggesting right atrial pressure of 8 mmHg. IAS/Shunts: No atrial level shunt detected by color flow Doppler.  LEFT VENTRICLE PLAX 2D LVIDd:         6.60 cm      Diastology LVIDs:         4.40 cm      LV e' medial:    8.70 cm/s LV PW:         1.20 cm      LV E/e' medial:  7.9 LV IVS:        1.20 cm      LV e' lateral:   13.80 cm/s LVOT diam:     2.00 cm      LV E/e' lateral: 5.0 LV SV:         90 LV SV Index:   29 LVOT Area:     3.14 cm?  LV Volumes (MOD) LV vol d, MOD A2C: 131.0 ml LV vol d, MOD A4C: 115.0 ml LV vol s, MOD A2C: 49.4 ml LV vol s, MOD A4C: 43.4 ml LV SV MOD A2C:     81.6 ml LV SV MOD A4C:     115.0 ml LV SV MOD BP:      77.5 ml RIGHT VENTRICLE             IVC RV Basal diam:  4.10 cm     IVC diam: 1.90 cm RV S prime:     16.60 cm/s TAPSE (M-mode): 2.8 cm LEFT ATRIUM             Index        RIGHT ATRIUM           Index LA diam:        4.30 cm 1.39 cm/m?   RA Area:     22.40 cm? LA Vol (A2C):   49.6 ml 16.04 ml/m?  RA Volume:   81.60 ml  26.39 ml/m? LA Vol (A4C):   86.9 ml 28.10 ml/m? LA Biplane Vol: 72.2 ml 23.35 ml/m?  AORTIC VALVE AV Area (Vmax): 2.69 cm? AV Vmax:        159.00 cm/s AV Peak  Grad:   10.1 mmHg LVOT Vmax:      136.00 cm/s LVOT Vmean:     102.000 cm/s LVOT VTI:       0.288 m  AORTA Ao Root diam: 3.60 cm Ao Asc diam:  3.50 cm MITRAL VALVE MV Area (PHT): 3.08 cm?    SHUNTS MV Dec

## 2021-07-10 NOTE — Progress Notes (Signed)
?PROGRESS NOTE ? ?Charles Gray  ?DOB: 11-18-1955  ?PCP: Enid Skeens., MD ?YCX:[id number]  ?DOA: 07/07/2021 ? LOS: 0 days  ?Hospital Day: 4 ? ?Brief narrative: ?Charles Gray is a 66 y.o. male with PMH significant for morbid obesity, OSA, HTN, HLD, chronic diastolic CHF, history of DVT/PE, A-fib on anticoagulation, COPD, liver cirrhosis, chronic pain on opioids, chronic constipation ?Patient presented to ED on 3/27 with complaint of bilateral lower extremity redness and swelling, progressively worsening for several days also accompanied by increasing shortness of breath for last few days. ?He received a course of oral antibiotics as an outpatient for suspected leg cellulitis without improvement. ? ?In the ED, patient was afebrile, hemodynamically stable ?CT angio of chest was suspected bilateral lower lobe segmental and subsegmental pulm emboli.  Of note, patient is on scheduled Eliquis and reports compliance to it. ?On clinical examination, patient was noted to be grossly volume overloaded. ?Admitted to hospital service. ?CHF service was consulted. ?Currently getting aggressively diuresed. ?  ?Subjective: ?Patient was seen and examined this morning. ?Still feels short of breath.  Not requiring supplemental oxygen at rest. ?1.4 L urine output in last 24 hours, about 4 L since admission. ? ?Principal Problem: ?  Acute pulmonary embolism ([place]) ?Active Problems: ?  Chronic diastolic HF (heart failure) ([place]) ?  Paroxysmal atrial fibrillation ([facility]) ?  COPD with acute exacerbation ([place]) ?  Lymphedema ?  Essential hypertension ?  GERD without esophagitis ?  ? ?Assessment and Plan: ?Acute on Chronic diastolic CHF ?Essential hypertension ?-Patient reports progressively worsening bilateral lower extremity edema limiting his ability to move.   ?-Ultrasound duplex lower extremities ruled out DVT ?-Patient has similar presentation in the past multiple times for CHF exacerbation ?-Cardiac consultation was obtained.   Patient was started on IV Lasix. ?-1.4 L urine output in last 24 hours, about 4 L since admission ?-3/29, echocardiogram with EF 60 to 65%, moderate to severely dilated left ventricular cavity, no wall motion abnormality. ?-PTA, patient was on Toprol 25 mg daily, torsemide 100 mg twice daily, metolazone 2.5 mg daily MWF, eplerenone 25 mg daily ?-Continue Toprol, Aldactone along with IV Lasix. ?-Compression stockings on both legs. ?-Net IO Since Admission: -3,888.39 mL [07/10/21 1419] ?-Continue to monitor for daily intake output, weight, blood pressure, BNP, renal function and electrolytes. ?Recent Labs  ?Lab 07/07/21 ?1616 07/07/21 ?1827 07/07/21 ?2236 07/09/21 ?0451 07/09/21 ?1355 07/10/21 ?0440  ?BNP  --   --  79.9  --   --   --   ?BUN 10  --   --  12 11 10   ?CREATININE 1.10  --   --  1.06 1.13 1.12  ?K 3.4*  --   --  2.9* 3.1* 3.9  ?MG  --  1.8  --  2.2  --  2.2  ? ?Acute on chronic hypokalemia ?-Patient regimen requires high dose of potassium replacement daily. ?-Continue to monitor ?Recent Labs  ?Lab 07/07/21 ?1616 07/07/21 ?1827 07/09/21 ?0451 07/09/21 ?1355 07/10/21 ?0440  ?K 3.4*  --  2.9* 3.1* 3.9  ?MG  --  1.8 2.2  --  2.2  ?Acute pulmonary embolism ?History of DVT/PE on Eliquis ?-Patient has history of DVT/PE and reports compliance to Eliquis at home.   ?-CT angio of chest was suboptimal and suspected bilateral lower lobe segmental and subsegmental PE.  ?-Ultrasound duplex of lower extremities negative for DVT. Unable to do VQ scan because of his weight. ?-On admission, patient was started on heparin drip. ?-I discussed with cardiologist Dr.  McLean.  Within, patient's respiratory distress is primarily due to volume overload and not PE.  Decision was made to continue Eliquis at this time. ?-Switch from heparin drip to Eliquis today. ? ?Paroxysmal atrial fibrillation ?-Currently rate controlled metoprolol. ?-Heparin drip  ?-Continue to monitor in telemetry ? ?Hyperlipidemia ?-Pravastatin 20 mg  daily ? ?COPD  ?-Not in acute exacerbation  ?-Continue bronchodilators.   ? ?GERD without esophagitis ?-Continue PPI ? ?Chronic pain ?-PTA, patient was on Suboxone 1 tab sublingual twice daily, Neurontin 800 mg 3 times daily, ? ?Chronic constipation ?-PTA, patient was on lactulose 10 g 3 times daily, MiraLAX daily ? ?Anxiety/depression ?-Zoloft 50 mg daily ? ?BPH ?-0.4 mg daily ? ?Goals of care ?  Code Status: Full Code  ? ?Mobility: Encourage ambulation. PT eval ordered. ? ?Nutritional status:  ?Body mass index is 41.31 kg/m?.  ?  ?  ? ? ? ? ?Diet:  ?Diet Order   ? ?       ?  Diet Heart Room service appropriate? Yes; Fluid consistency: Thin  Diet effective now       ?  ? ?  ?  ? ?  ? ? ?DVT prophylaxis: Eliquis ? ?Antimicrobials: None ?Fluid: None ?Consultants: Cardiology  ?Family Communication: Family not at bedside ? ?Status is: Observation ? ?Continue in-hospital care because: Needs volume management ?Level of care: Telemetry Medical  ? ?Dispo: The patient is from: Home ?             Anticipated d/c is to: Pending clinical course and PT eval ?             Patient currently is not medically stable to d/c. ?  Difficult to place patient No ? ? ? ? ?Infusions:  ? sodium chloride 10 mL/hr at 07/09/21 1704  ? heparin 2,300 Units/hr (07/10/21 1157)  ? ? ?Scheduled Meds: ? acetaZOLAMIDE  250 mg Oral Daily  ? buprenorphine-naloxone  1 tablet Sublingual Daily  ? Chlorhexidine Gluconate Cloth  6 each Topical Daily  ? furosemide  80 mg Intravenous BID  ? gabapentin  800 mg Oral TID  ? insulin aspart  0-15 Units Subcutaneous TID WC  ? insulin aspart  0-5 Units Subcutaneous QHS  ? lactulose  10 g Oral TID  ? metolazone  2.5 mg Oral BID  ? metoprolol succinate  25 mg Oral QHS  ? pantoprazole  40 mg Oral BID AC  ? potassium chloride  40 mEq Oral TID  ? pravastatin  20 mg Oral QHS  ? sertraline  50 mg Oral Daily  ? sodium chloride flush  10-40 mL Intracatheter Q12H  ? spironolactone  25 mg Oral Daily  ? tamsulosin  0.4 mg Oral  Daily  ? ? ?PRN meds: ?sodium chloride, acetaminophen **OR** acetaminophen, ipratropium-albuterol, ondansetron **OR** ondansetron (ZOFRAN) IV, polyethylene glycol, sodium chloride, sodium chloride flush, traMADol, zolpidem  ? ?Antimicrobials: ?Anti-infectives (From admission, onward)  ? ? None  ? ?  ? ? ?Objective: ?Vitals:  ? 07/10/21 0300 07/10/21 0822  ?BP: (!) 154/98 97/62  ?Pulse: 80 (!) 111  ?Resp:    ?Temp: 97.7 ?F (36.5 ?C) 98.8 ?F (37.1 ?C)  ?SpO2: 97% 92%  ? ? ?Intake/Output Summary (Last 24 hours) at 07/10/2021 1419 ?Last data filed at 07/10/2021 1148 ?Gross per 24 hour  ?Intake 519.33 ml  ?Output 4100 ml  ?Net -3580.67 ml  ? ?Filed Weights  ? 07/08/21 0713 07/10/21 0501  ?Weight: (!) 174.6 kg (!) 179.2 kg  ? ?Weight change:  4.581 kg ?Body mass index is 41.31 kg/m?.  ? ?Physical Exam: ?General exam: Pleasant elderly morbidly obese Caucasian male. ?Skin: No rashes, lesions or ulcers. ?HEENT: Atraumatic, normocephalic, no obvious bleeding ?Lungs: Clear to auscultation bilaterally ?CVS: Regular rate and rhythm, no murmur ?GI/Abd soft, distended from obesity, nontender, bowel sound present ?CNS: Alert, awake, oriented x 3 ?Psychiatry: Sad affect. ?Extremities: Compression bandages on bilateral lower extremity pedal edema. ? ?Data Review: I have personally reviewed the laboratory data and studies available. ? ?F/u labs ordered  ?Unresulted Labs (From admission, onward)  ? ?  Start     Ordered  ? 07/10/21 1600  Heparin level (unfractionated)  Once-Timed,   TIMED       ?Question:  Specimen collection method  Answer:  Unit=Unit collect  ? 07/10/21 1141  ? 07/10/21 1600  APTT  Once-Timed,   TIMED       ?Question:  Specimen collection method  Answer:  Unit=Unit collect  ? 07/10/21 1141  ? 07/10/21 1139  Hemoglobin A1c  Once,   R       ?Comments: To assess prior glycemic control ?  ?Question:  Specimen collection method  Answer:  Unit=Unit collect  ? 07/10/21 1138  ? 07/10/21 9872  Basic metabolic panel  Daily,   R      ? 07/09/21 1104  ? 07/10/21 0500  CBC  Daily,   R     ? 07/09/21 1104  ? 07/10/21 0500  Magnesium  Daily,   R     ? 07/09/21 1104  ? 07/09/21 1523  Protein C activity  (Hypercoagulable Panel, Comprehensive ([facility]))  Onc

## 2021-07-10 NOTE — Evaluation (Addendum)
Occupational Therapy Evaluation Patient Details Name: Charles Gray MRN: [id number] DOB: September 16, 1955 Today's Date: 07/10/2021   History of Present Illness Pt is a 66 y.o. M who presents 07/07/2021 with heat failure with fluid overload.  CT scan this admit limited because of suboptimal opacification and artifact, however is suggestive of bilateral lower lobe segmental and subsegmental pulm emboli but no evidence of Rt heart strain. Follow up V/Q scan has been ordered. LE veous dopplers negative for DVT. Significant PMH: chronic diastolic heart failure, OSA, obesity, history of PE/DVT, PAF, HTN.   Clinical Impression   PTA pt lives alone and has an aide 3 hrs/day 5 days/wk who helps with ADL and IADL tasks. Pt lives in 2nd floor apt and has difficulty negotiating steps due to fatigue ( has asked for a first floor apt). Uses a rollator in the apt. Pt states he struggles with LB ADL tasks when aide is not available. Pt states he knows he should weigh himself daily however he has not done so lately. Pt expresses concern over transportation issues as [facility] has recently stopped and pt does not know how he will get to his appointments.Recommend resuming HHOT after DC ([facility]). Acute OT to follow.      Recommendations for follow up therapy are one component of a multi-disciplinary discharge planning process, led by the attending physician.  Recommendations may be updated based on patient status, additional functional criteria and insurance authorization.   Follow Up Recommendations  Home health OT [facility])    Assistance Recommended at Discharge Intermittent Supervision/Assistance  Patient can return home with the following A little help with walking and/or transfers;A little help with bathing/dressing/bathroom;Assistance with cooking/housework;Assist for transportation;Help with stairs or ramp for entrance    Functional Status Assessment  Patient has had a recent  decline in their functional status and demonstrates the ability to make significant improvements in function in a reasonable and predictable amount of time.  Equipment Recommendations  None recommended by OT (may benefit form AE)    Recommendations for Other Services       Precautions / Restrictions Precautions Precautions: Fall Restrictions Weight Bearing Restrictions: No      Mobility Bed Mobility               General bed mobility comments: Sitting EOB upon arrival    Transfers Overall transfer level: Modified independent  Ambulated @ 30 ft in room @ RW level                      Balance Overall balance assessment: Needs assistance Sitting-balance support: Feet supported Sitting balance-Leahy Scale: Good     Standing balance support: During functional activity, Single extremity supported Standing balance-Leahy Scale: Poor Standing balance comment: reliant on at least single UE support                           ADL either performed or assessed with clinical judgement   ADL Overall ADL's : Needs assistance/impaired     Grooming: Set up   Upper Body Bathing: Set up   Lower Body Bathing: Sit to/from stand;Moderate assistance   Upper Body Dressing : Set up   Lower Body Dressing: Minimal assistance;Sit to/from stand   Toilet Transfer: Minimal assistance   Toileting- Clothing Manipulation and Hygiene: Minimal assistance       Functional mobility during ADLs: Minimal assistance General ADL Comments: May benefit fom AE  Vision Baseline Vision/History: 1 Wears glasses (wears reading glasses)       Perception     Praxis      Pertinent Vitals/Pain Pain Assessment Pain Assessment: 0-10 Pain Score: 7  Pain Location: back; feet/toes Pain Descriptors / Indicators: Constant, Headache, Aching Pain Intervention(s): Limited activity within patient's tolerance     Hand Dominance Right   Extremity/Trunk Assessment Upper  Extremity Assessment Upper Extremity Assessment: Generalized weakness (B shoulder pain/weakness)   Lower Extremity Assessment Lower Extremity Assessment:  (B numbenss; numbness) RLE Deficits / Details: Unna boots donned LLE Deficits / Details: Unna boots donned   Cervical / Trunk Assessment Cervical / Trunk Assessment: Kyphotic   Communication Communication Communication: No difficulties   Cognition Arousal/Alertness: Awake/alert Behavior During Therapy: Flat affect Overall Cognitive Status: Within Functional Limits for tasks assessed                                       General Comments  B Una boots    Exercises     Shoulder Instructions      Home Living Family/patient expects to be discharged to:: Private residence Living Arrangements: Alone Available Help at Discharge: Personal care attendant;Neighbor;Available PRN/intermittently Type of Home: Apartment Home Access: Stairs to enter Entrance Stairs-Number of Steps: 20+ Entrance Stairs-Rails: Right;Left;Can reach both Home Layout: One level     Bathroom Shower/Tub: Chief Strategy Officer: Handicapped height Bathroom Accessibility: Yes   Home Equipment: Agricultural consultant (2 wheels);Rollator (4 wheels);Tub bench;Hand held shower head;Toilet riser;Hospital bed;Adaptive equipment Adaptive Equipment: Reacher Additional Comments: aide assist 3 hrs/daily, x5 days/week. on list to transfer to first floor apartment      Prior Functioning/Environment Prior Level of Function : Needs assist;History of Falls (last six months)             Mobility Comments: use of Rollator for mobility. occasional assist with tub transfers with tub bench. Has to sit on stairs occasionally due to fatigue. x1 fall in past 6 months ADLs Comments: Able to complete ADLs typically without assist. occasional aide assist with tub transfers, showering and brushing hair due to fatigue. Pt states LB ADL are very difficult and  makes him "lose his breath"; takes a "very long time"; peri-care is very difficult; difficulty wears shoes due to swelling and difficulty reaching feet; Aide assists with household IADLs; used [facility] previously but states that stopped @ 1 week before admission; gets neighbor to pick up meds/has difficulty having meds delivered at times        OT Problem List: Decreased strength;Decreased activity tolerance;Decreased range of motion;Decreased safety awareness;Decreased knowledge of use of DME or AE;Cardiopulmonary status limiting activity;Impaired sensation;Obesity;Increased edema;Pain      OT Treatment/Interventions: Self-care/ADL training;Therapeutic exercise;Energy conservation;DME and/or AE instruction;Therapeutic activities;Patient/family education;Balance training    OT Goals(Current goals can be found in the care plan section) Acute Rehab OT Goals Patient Stated Goal: to be able to do more for himself adn go for a walk; wants to see his granschildren OT Goal Formulation: With patient Time For Goal Achievement: 07/24/21 Potential to Achieve Goals: Good  OT Frequency: Min 2X/week    Co-evaluation              AM-PAC OT "6 Clicks" Daily Activity     Outcome Measure Help from another person eating meals?: None Help from another person taking care of personal grooming?: A Little Help from another  person toileting, which includes using toliet, bedpan, or urinal?: A Little Help from another person bathing (including washing, rinsing, drying)?: A Lot Help from another person to put on and taking off regular upper body clothing?: A Little Help from another person to put on and taking off regular lower body clothing?: A Little 6 Click Score: 18   End of Session Nurse Communication: Mobility status  Activity Tolerance: Patient limited by fatigue Patient left: in bed;with call bell/phone within reach;with bed alarm set  OT Visit Diagnosis: Unsteadiness on feet (R26.81);Other  abnormalities of gait and mobility (R26.89);Muscle weakness (generalized) (M62.81);Pain Pain - part of body:  (back; B feet)                Time: 5784-6962 OT Time Calculation (min): 26 min Charges:  OT General Charges $OT Visit: 1 Visit OT Evaluation $OT Eval Moderate Complexity: 1 Mod OT Treatments $Self Care/Home Management : 8-22 mins  Luisa Dago, OT/L   Acute OT Clinical Specialist Acute Rehabilitation Services Pager [phone] Office [phone]   [facility] 07/10/2021, 10:09 AM

## 2021-07-10 NOTE — Progress Notes (Addendum)
ANTICOAGULATION CONSULT NOTE  ? ?Pharmacy Consult for Heparin  ?Indication: pulmonary embolus, afib ? ?Allergies  ?Allergen Reactions  ? Penicillins Other (See Comments)  ?  Caused convulsions as a child  ?Did it involve swelling of the face/tongue/throat, SOB, or low BP? N/A ?Did it involve sudden or severe rash/hives, skin peeling, or any reaction on the inside of your mouth or nose? N/A ?Did you need to seek medical attention at a hospital or doctor's office?N/A ?When did it last happen? N/A ?If all above answers are "NO", may proceed with cephalosporin use.  ? ? ?Vital Signs: ?Temp: 97.7 ?F (36.5 ?C) (03/30 0300) ?Temp Source: Oral (03/30 0300) ?BP: 154/98 (03/30 0300) ?Pulse Rate: 80 (03/30 0300) ? ?Labs: ?Recent Labs  ?  07/07/21 ?1616 07/07/21 ?2236 07/08/21 ?0003 07/08/21 ?1344 07/09/21 ?0451 07/09/21 ?1355 07/09/21 ?1500 07/10/21 ?0440  ?HGB  --   --   --  10.2* 8.9*  --   --  9.1*  ?HCT  --   --   --  36.3* 30.2*  --   --  32.0*  ?PLT  --   --   --  162 141*  --   --  133*  ?APTT   < >  --   --  66* 100*  --  99* 110*  ?HEPARINUNFRC  --   --   --  >1.10* >1.10*  --   --  0.65  ?CREATININE  --   --   --   --  1.06 1.13  --  1.12  ?TROPONINIHS  --  7 6  --   --   --   --   --   ? < > = values in this interval not displayed.  ? ? ? ?Estimated Creatinine Clearance: 122.8 mL/min (by C-G formula based on SCr of 1.12 mg/dL). ? ? ?Medical History: ?Past Medical History:  ?Diagnosis Date  ? Acute on chronic congestive heart failure ([place])   ? Acute on chronic diastolic CHF (congestive heart failure) ([place]) 08/23/2019  ? Acute on chronic right-sided heart failure ([place]) 08/06/2019  ? Acute respiratory failure with hypoxia ([place])   ? Anemia of chronic disease   ? Atrial fibrillation ([place]) 12/23/2019  ? Atrial tachycardia ([place]) 08/12/2019  ? Benign essential HTN   ? Chest pain 08/23/2019  ? Chronic anticoagulation 12/26/2019  ? Chronic heart failure with preserved EF 05/30/2019  ? Echo 07/2019: EF 55-60, GR 1 DD  ? Chronic pain  syndrome   ? Cirrhosis of liver without ascites ([place])   ? Class 2 severe obesity due to excess calories with serious comorbidity and body mass index (BMI) of 39.0 to 39.9 in adult [facility]) 05/30/2019  ? Debility 03/13/2019  ? Drug induced constipation   ? Dyspnea   ? Fatty liver disease, nonalcoholic   ? GIB (gastrointestinal bleeding) 03/04/2019  ? Heart failure, systolic, acute ([place]) 5/68/1275  ? History of pulmonary embolism 08/12/2019  ? Hyperlipidemia 01/24/2019  ? Hypertension 01/24/2019  ? Hypoalbuminemia due to protein-calorie malnutrition ([place])   ? Hypokalemia   ? Hyponatremia   ? Hypotension due to drugs   ? Insomnia 01/24/2019  ? Lactic acidosis 08/23/2019  ? Lumbar disc herniation 01/24/2019  ? Mixed hyperlipidemia 01/24/2019  ? Morbid obesity with BMI of 40.0-44.9, adult ([place]) 05/30/2019  ? Normocytic anemia 06/08/2019  ? Occasional tremors 08/12/2019  ? OSA (obstructive sleep apnea) 08/12/2019  ? Palpitations 12/26/2019  ? Peripheral edema   ? Personal history of DVT (deep  vein thrombosis) 12/26/2019  ? Portal hypertensive gastropathy ([place]) 03/06/2019  ? Seen on EGD 03/06/2019  ? Pulmonary emboli ([place]) 06/08/2019  ? Pulmonary embolism ([place]) 06/08/2019  ? Redness and swelling of lower leg 08/23/2019  ? Shock ([place])   ? Thrombocytopenia ([place])   ? ? ? ?Assessment: ?66 y/o M with palpitations and chest pain found to have possible PE on CT. Pt is on apixaban PTA for history of VTE and afib. Last dose apixaban 3/28 with possible new PE started on heparin with no bolus.  ? ?Heparin level therapeutic: 0.65, no infusion issues or s/sx of bleeding per RN ? ?Goal of Therapy:  ?Heparin level 0.3-0.7 units/ml ?Monitor platelets by anticoagulation protocol: Yes ?  ?Plan:  ?Continue heparin gtt at 2400 units/hr ?Heparin level and CBC daily while on heparin ?F/u ability to switch to Eliquis or if needs work up for coagulopathy ? ? ?Georga Bora, PharmD ?Clinical Pharmacist ?07/10/2021 6:40 AM ?Please check AMION for all [place]  numbers  ? ? ? ?

## 2021-07-10 NOTE — TOC Initial Note (Addendum)
Transition of Care (TOC) - Initial/Assessment Note  ? ? ?Patient Details  ?Name: Charles Gray ?MRN: [id number] ?Date of Birth: 1956-02-16 ? ?Transition of Care [facility]) CM/SW Contact:    ?[name] Rexene Alberts, RN ?Phone Number: [phone] ?07/10/2021, 4:58 PM ? ?Clinical Narrative:                 ? ?HF TOC CM spoke to pt. States he lives in home alone with cat. Pt is active with [facility] for [facility] RN, PT, OT. Pt reports he has needed DME at home. Pt request to dc home via ambulance. Pt has HF Paramedicine Team outpatient. Will continue to follow for dc needs. Will need resumptions of care order for [facility] RN, PT, OT with F2F. Message sent to attending.  ? ?Contacted [facility] rep, Marjory Lies with resumption of care order.  ? ? ? ?Expected Discharge Plan: [place] ?Barriers to Discharge: Continued Medical Work up ? ? ?Patient Goals and CMS Choice ?Patient states their goals for this hospitalization and ongoing recovery are:: wants to get the fluid off ?CMS Medicare.gov Compare Post Acute Care list provided to:: Patient ?Choice offered to / list presented to : Patient ? ?Expected Discharge Plan and Services ?Expected Discharge Plan: [place] ?  ?Discharge Planning Services: CM Consult ?Post Acute Care Choice: Home Health ?Living arrangements for the past 2 months: Apartment ? ?  ?HH Arranged: RN, PT, OT ?[place] Agency: [place] ? ? ?Prior Living Arrangements/Services ?Living arrangements for the past 2 months: Apartment ?Lives with:: Self ?Patient language and need for interpreter reviewed:: Yes ?Do you feel safe going back to the place where you live?: Yes      ?Need for Family Participation in Patient Care: Yes (Comment) ?Care giver support system in place?: Yes (comment) ?Current home services: DME, Home RN, Home OT, Home PT, Homehealth aide (CPAP, shower bench, scale, blood pressure cuff, Rollator, rolling walker, Home Health RN, PT, OT, aide) ?Criminal Activity/Legal  Involvement Pertinent to Current Situation/Hospitalization: No - Comment as needed ? ?Activities of Daily Living ?  ?  ? ?Permission Sought/Granted ?Permission sought to share information with : Case Manager, Family Supports, PCP ?Permission granted to share information with : Yes, Verbal Permission Granted ? ?Emotional Assessment ?Appearance:: Appears stated age ?Attitude/Demeanor/Rapport: Engaged ?Affect (typically observed): Accepting ?Orientation: : Oriented to Self, Oriented to Place, Oriented to  Time, Oriented to Situation ?  ?Psych Involvement: No (comment) ? ?Admission diagnosis:  Pulmonary embolism ([place]) [I26.99] ?Bilateral leg edema [R60.0] ?Venous stasis dermatitis of both lower extremities [I87.2] ?Chest pain, unspecified type [R07.9] ?Other pulmonary embolism without acute cor pulmonale, unspecified chronicity ([place]) [I26.99] ?Patient Active Problem List  ? Diagnosis Date Noted  ? Lymphedema 07/08/2021  ? Melena   ? Gastric polyps   ? UGIB (upper gastrointestinal bleed) 04/06/2021  ? Acute encephalopathy 12/21/2020  ? COPD with acute exacerbation ([place]) 12/21/2020  ? Chronic diastolic (congestive) heart failure ([place]) 11/14/2020  ? Syncope 10/28/2020  ? CHF (congestive heart failure) ([place]) 10/28/2020  ? Chronic venous stasis dermatitis of both lower extremities 08/27/2020  ? Cellulitis of both lower extremities 08/14/2020  ? Iron deficiency anemia 08/14/2020  ? GERD without esophagitis 06/10/2020  ? Atrial fibrillation with rapid ventricular response ([place]) 03/24/2020  ? Acute on chronic right heart failure ([place]) 01/27/2020  ? Acute respiratory failure with hypoxia ([place])   ? Fatty liver disease, nonalcoholic   ? Shock ([place])   ? Personal history of DVT (  deep vein thrombosis) 12/26/2019  ? Chronic anticoagulation 12/26/2019  ? Palpitations 12/26/2019  ? Paroxysmal atrial fibrillation ([place]) 12/23/2019  ? Heart failure, systolic, acute ([place]) 47/82/9562  ? Acute on chronic congestive heart failure ([place])   ?  Chronic diastolic HF (heart failure) ([place]) 08/23/2019  ? Chest pain 08/23/2019  ? Lactic acidosis 08/23/2019  ? History of pulmonary embolism 08/12/2019  ? Atrial tachycardia ([place]) 08/12/2019  ? Occasional tremors 08/12/2019  ? OSA (obstructive sleep apnea) 08/12/2019  ? Acute on chronic right-sided heart failure ([place]) 08/06/2019  ? Acute pulmonary embolism ([place]) 06/08/2019  ? Normocytic anemia 06/08/2019  ? Pulmonary emboli ([place]) 06/08/2019  ? Chronic heart failure with preserved ejection fraction ([place]) 05/30/2019  ? Class 2 severe obesity due to excess calories with serious comorbidity and body mass index (BMI) of 39.0 to 39.9 in adult [facility]) 05/30/2019  ? Morbid obesity with BMI of 40.0-44.9, adult ([place]) 05/30/2019  ? Hyponatremia   ? Drug induced constipation   ? Peripheral edema   ? Hypotension due to drugs   ? Hypoalbuminemia due to protein-calorie malnutrition ([place])   ? Hypokalemia   ? Thrombocytopenia ([place])   ? Anemia of chronic disease   ? Cirrhosis of liver without ascites ([place])   ? Chronic pain syndrome   ? Debility 03/13/2019  ? Portal hypertensive gastropathy ([place]) 03/06/2019  ? Dyspnea   ? GIB (gastrointestinal bleeding) 03/04/2019  ? Mixed hyperlipidemia 01/24/2019  ? Insomnia 01/24/2019  ? Hyperlipidemia 01/24/2019  ? Essential hypertension 01/24/2019  ? Lumbar disc herniation 01/24/2019  ? ?PCP:  Enid Skeens., MD ?Pharmacy:   ?[facility], [place] ?[place] ?[place] [address] ?Phone: [phone] Fax: [phone] ? ? ? ? ?Social Determinants of Health (SDOH) Interventions ?  ? ?Readmission Risk Interventions ? ?  09/26/2020  ?  9:27 AM 06/12/2020  ?  5:03 PM 03/27/2020  ? 11:51 AM  ?Readmission Risk Prevention Plan  ?Transportation Screening Complete Complete Complete  ?Medication Review Press photographer) Complete Complete Complete  ?PCP or Specialist appointment within 3-5 days of discharge Complete  Complete  ?[place] or Home Care Consult Complete Complete  Complete  ?SW Recovery Care/Counseling Consult Complete Complete Complete  ?Palliative Care Screening Not Applicable Not Applicable Not Applicable  ?[place] Not Applicable Not Applicable Not Applicable  ? ? ? ?

## 2021-07-11 ENCOUNTER — Other Ambulatory Visit (HOSPITAL_COMMUNITY): Payer: Self-pay

## 2021-07-11 DIAGNOSIS — I2699 Other pulmonary embolism without acute cor pulmonale: Secondary | ICD-10-CM | POA: Diagnosis not present

## 2021-07-11 DIAGNOSIS — I5033 Acute on chronic diastolic (congestive) heart failure: Secondary | ICD-10-CM | POA: Diagnosis not present

## 2021-07-11 LAB — BETA-2-GLYCOPROTEIN I ABS, IGG/M/A
Beta-2 Glyco I IgG: <9 GPI IgG units (ref 0–20)
Beta-2-Glycoprotein I IgA: 9 GPI IgA units (ref 0–25)
Beta-2-Glycoprotein I IgM: 35 GPI IgM units — ABNORMAL HIGH (ref 0–32)

## 2021-07-11 LAB — BASIC METABOLIC PANEL
Anion gap: 9 (ref 5–15)
Anion gap: 15 (ref 5–15)
BUN: 9 mg/dL (ref 8–23)
BUN: 10 mg/dL (ref 8–23)
CO2: 31 mmol/L (ref 22–32)
CO2: 29 mmol/L (ref 22–32)
Calcium: 9.1 mg/dL (ref 8.9–10.3)
Calcium: 9 mg/dL (ref 8.9–10.3)
Chloride: 94 mmol/L — ABNORMAL LOW (ref 98–111)
Chloride: 89 mmol/L — ABNORMAL LOW (ref 98–111)
Creatinine, Ser: 1.1 mg/dL (ref 0.61–1.24)
Creatinine, Ser: 1.05 mg/dL (ref 0.61–1.24)
GFR, Estimated: >60 mL/min (ref >60)
GFR, Estimated: >60 mL/min (ref >60)
Glucose, Bld: 133 mg/dL — ABNORMAL HIGH (ref 70–99)
Glucose, Bld: 150 mg/dL — ABNORMAL HIGH (ref 70–99)
Potassium: 2.7 mmol/L — CL (ref 3.5–5.1)
Potassium: 3.2 mmol/L — ABNORMAL LOW (ref 3.5–5.1)
Sodium: 134 mmol/L — ABNORMAL LOW (ref 135–145)
Sodium: 133 mmol/L — ABNORMAL LOW (ref 135–145)

## 2021-07-11 LAB — GLUCOSE, CAPILLARY
Glucose-Capillary: 116 mg/dL — ABNORMAL HIGH (ref 70–99)
Glucose-Capillary: 142 mg/dL — ABNORMAL HIGH (ref 70–99)
Glucose-Capillary: 117 mg/dL — ABNORMAL HIGH (ref 70–99)
Glucose-Capillary: 146 mg/dL — ABNORMAL HIGH (ref 70–99)

## 2021-07-11 LAB — CBC
HCT: 31.7 % — ABNORMAL LOW (ref 39.0–52.0)
Hemoglobin: 9.1 g/dL — ABNORMAL LOW (ref 13.0–17.0)
MCH: 21.7 pg — ABNORMAL LOW (ref 26.0–34.0)
MCHC: 28.7 g/dL — ABNORMAL LOW (ref 30.0–36.0)
MCV: 75.5 fL — ABNORMAL LOW (ref 80.0–100.0)
Platelets: 145 10*3/uL — ABNORMAL LOW (ref 150–400)
RBC: 4.2 MIL/uL — ABNORMAL LOW (ref 4.22–5.81)
RDW: 17.5 % — ABNORMAL HIGH (ref 11.5–15.5)
WBC: 7.4 10*3/uL (ref 4.0–10.5)
nRBC: 0 % (ref 0.0–0.2)

## 2021-07-11 LAB — MAGNESIUM: Magnesium: 2.2 mg/dL (ref 1.7–2.4)

## 2021-07-11 LAB — PROTEIN C, TOTAL: Protein C, Total: 64 % (ref 60–150)

## 2021-07-11 MED ORDER — APIXABAN (ELIQUIS) VTE STARTER PACK (10MG AND 5MG)
ORAL_TABLET | ORAL | 0 refills | Status: DC
  Filled 2021-07-11: qty 74, 30d supply, fill #0

## 2021-07-11 MED ORDER — POTASSIUM CHLORIDE 10 MEQ/100ML IV SOLN
10.0000 meq | INTRAVENOUS | Status: AC
  Administered 2021-07-11 (×3): 10 meq via INTRAVENOUS
  Filled 2021-07-11 (×3): qty 100

## 2021-07-11 MED ORDER — PRAVASTATIN SODIUM 20 MG PO TABS
20.0000 mg | ORAL_TABLET | Freq: Every day | ORAL | 0 refills | Status: DC
  Filled 2021-07-11: qty 60, 60d supply, fill #0

## 2021-07-11 MED ORDER — TORSEMIDE 20 MG PO TABS
100.0000 mg | ORAL_TABLET | Freq: Two times a day (BID) | ORAL | Status: DC
  Administered 2021-07-11 – 2021-07-17 (×11): 100 mg via ORAL
  Filled 2021-07-11 (×12): qty 5

## 2021-07-11 MED ORDER — EPLERENONE 25 MG PO TABS
50.0000 mg | ORAL_TABLET | Freq: Every day | ORAL | 0 refills | Status: DC
  Filled 2021-07-11: qty 120, 60d supply, fill #0

## 2021-07-11 MED ORDER — POTASSIUM CHLORIDE CRYS ER 20 MEQ PO TBCR
40.0000 meq | EXTENDED_RELEASE_TABLET | Freq: Once | ORAL | Status: AC
  Administered 2021-07-11: 40 meq via ORAL
  Filled 2021-07-11: qty 2

## 2021-07-11 MED ORDER — TORSEMIDE 100 MG PO TABS
100.0000 mg | ORAL_TABLET | ORAL | 0 refills | Status: DC
  Filled 2021-07-11: qty 60, 30d supply, fill #0

## 2021-07-11 MED ORDER — POTASSIUM CHLORIDE 20 MEQ PO PACK
60.0000 meq | PACK | Freq: Three times a day (TID) | ORAL | Status: DC
  Administered 2021-07-11 – 2021-07-12 (×4): 60 meq via ORAL
  Filled 2021-07-11 (×4): qty 3

## 2021-07-11 MED ORDER — METOPROLOL SUCCINATE ER 25 MG PO TB24
25.0000 mg | ORAL_TABLET | Freq: Every day | ORAL | 0 refills | Status: DC
  Filled 2021-07-11: qty 90, 90d supply, fill #0

## 2021-07-11 NOTE — Progress Notes (Signed)
?PROGRESS NOTE ? ?[name]  ?DOB: [birth year]  ?PCP: Enid Skeens., MD ?SJG:[id number]  ?DOA: 07/07/2021 ? LOS: 0 days  ?Hospital Day: 5 ? ?Brief narrative: ?Charles Gray is a 66 y.o. male with PMH significant for morbid obesity, OSA, HTN, HLD, chronic diastolic CHF, history of DVT/PE, A-fib on anticoagulation, COPD, liver cirrhosis, chronic pain on opioids, chronic constipation ?Patient presented to ED on 3/27 with complaint of bilateral lower extremity redness and swelling, progressively worsening for several days also accompanied by increasing shortness of breath for last few days. ?He received a course of oral antibiotics as an outpatient for suspected leg cellulitis without improvement. ? ?In the ED, patient was afebrile, hemodynamically stable ?CT angio of chest was suspected bilateral lower lobe segmental and subsegmental pulm emboli.  Of note, patient is on scheduled Eliquis and reports compliance to it. ?On clinical examination, patient was noted to be grossly volume overloaded. ?Admitted to hospital service. ?CHF service was consulted. ?Currently getting aggressively diuresed. ?  ?Subjective: ?Patient was seen and examined at bedside.  He reports diffuse pain involving his lower extremities bilaterally, abdomen, back.  No hemoptysis. ? ?Principal Problem: ?  Acute pulmonary embolism ([place]) ?Active Problems: ?  Chronic diastolic HF (heart failure) ([place]) ?  Paroxysmal atrial fibrillation ([facility]) ?  COPD with acute exacerbation ([place]) ?  Lymphedema ?  Essential hypertension ?  GERD without esophagitis ?  ? ?Assessment and Plan: ?Acute on Chronic diastolic CHF ?Essential hypertension ?-Patient reports progressively worsening bilateral lower extremity edema limiting his ability to move.   ?-Ultrasound duplex lower extremities ruled out DVT ?-Patient has similar presentation in the past multiple times for CHF exacerbation ?-Cardiac consultation was obtained.  Patient was started on IV Lasix. ?-1.4 L urine  output in last 24 hours, about 4 L since admission ?-3/29, echocardiogram with EF 60 to 65%, moderate to severely dilated left ventricular cavity, no wall motion abnormality. ?-PTA, patient was on Toprol 25 mg daily, torsemide 100 mg twice daily, metolazone 2.5 mg daily MWF, eplerenone 25 mg daily ?-Continue Toprol, Aldactone along with IV Lasix. ?-Compression stockings on both legs. ?-Net IO Since Admission: -8,158.39 mL [07/11/21 1404] ?-Continue to monitor for daily intake output, weight, blood pressure, BNP, renal function and electrolytes. ?Recent Labs  ?Lab 07/07/21 ?1616 07/07/21 ?1827 07/07/21 ?2236 07/09/21 ?0451 07/09/21 ?1355 07/10/21 ?6546 07/11/21 ?0431  ?BNP  --   --  79.9  --   --   --   --   ?BUN 10  --   --  12 11 10 9   ?CREATININE 1.10  --   --  1.06 1.13 1.12 1.10  ?K 3.4*  --   --  2.9* 3.1* 3.9 2.7*  ?MG  --  1.8  --  2.2  --  2.2 2.2  ? ?Acute on chronic hypokalemia ?-Patient regimen requires high dose of potassium replacement daily. ?Replace electrolytes as indicated. ?Serum potassium 2.7 ?Magnesium 2.2 ?Repleted orally and intravenously. ?-Continue to monitor ?Recent Labs  ?Lab 07/07/21 ?1616 07/07/21 ?1827 07/09/21 ?0451 07/09/21 ?1355 07/10/21 ?5035 07/11/21 ?0431  ?K 3.4*  --  2.9* 3.1* 3.9 2.7*  ?MG  --  1.8 2.2  --  2.2 2.2  ?Acute pulmonary embolism ?History of DVT/PE on Eliquis ?-Patient has history of DVT/PE and reports compliance to Eliquis at home.   ?-CT angio of chest was suboptimal and suspected bilateral lower lobe segmental and subsegmental PE.  ?-Ultrasound duplex of lower extremities negative for DVT. Unable to do VQ scan because  of his weight. ?-On admission, patient was started on heparin drip. ?-I discussed with cardiologist Dr. [name].  Within, patient's respiratory distress is primarily due to volume overload and not PE.  Decision was made to continue Eliquis at this time. ?-Switch from heparin drip to Eliquis on 3/30. ?No hemoptysis or chest pain reported. ?Continue to  closely monitor on telemetry. ? ?Paroxysmal atrial fibrillation ?-Currently rate controlled metoprolol. ?Eliquis for CVA prevention ?-Continue to monitor in telemetry ? ?Hyperlipidemia ?-Pravastatin 20 mg daily ? ?COPD  ?-Not in acute exacerbation  ?Continue bronchodilators.   ?Not hypoxic, O2 saturation 95% on ambient air. ? ?GERD without esophagitis ?-Continue PPI ? ?Chronic pain ?-PTA, patient was on Suboxone 1 tab sublingual twice daily, Neurontin 800 mg 3 times daily, ? ?Chronic constipation ?-PTA, patient was on lactulose 10 g 3 times daily, MiraLAX daily ? ?Anxiety/depression ?-Zoloft 50 mg daily ? ?BPH ?-0.4 mg daily ? ?Chronic lower extremity edema/chronic venous stasis ?Continue Unna boots ?Elevate lower extremities ? ? ?Goals of care ?  Code Status: Full Code  ? ?Mobility: Encourage ambulation. PT eval ordered. ? ?Nutritional status:  ?Body mass index is 40.92 kg/m?.  ?  ?  ? ? ? ? ?Diet:  ?Diet Order   ? ?       ?  Diet Heart Room service appropriate? Yes; Fluid consistency: Thin  Diet effective now       ?  ? ?  ?  ? ?  ? ? ?DVT prophylaxis: Eliquis ? ?apixaban (ELIQUIS) tablet 10 mg  ?apixaban (ELIQUIS) tablet 5 mg Antimicrobials: None ?Fluid: None ?Consultants: Cardiology  ?Family Communication: Family not at bedside ? ?Status is: Observation ? ?Continue in-hospital care because: Needs volume management ?Level of care: Telemetry Medical  ? ?Dispo: The patient is from: Home ?             Anticipated d/c is to: Pending clinical course and PT eval ?             Patient currently is not medically stable to d/c. ?  Difficult to place patient No ? ? ? ? ?Infusions:  ? sodium chloride 10 mL/hr at 07/09/21 1704  ? ? ?Scheduled Meds: ? apixaban  10 mg Oral BID  ? Followed by  ? [START ON 07/17/2021] apixaban  5 mg Oral BID  ? buprenorphine-naloxone  1 tablet Sublingual Daily  ? Chlorhexidine Gluconate Cloth  6 each Topical Daily  ? gabapentin  800 mg Oral TID  ? insulin aspart  0-15 Units Subcutaneous TID WC  ?  insulin aspart  0-5 Units Subcutaneous QHS  ? lactulose  10 g Oral TID  ? metoprolol succinate  25 mg Oral QHS  ? pantoprazole  40 mg Oral BID AC  ? potassium chloride  60 mEq Oral TID  ? pravastatin  20 mg Oral QHS  ? sertraline  50 mg Oral Daily  ? sodium chloride flush  10-40 mL Intracatheter Q12H  ? spironolactone  25 mg Oral Daily  ? tamsulosin  0.4 mg Oral Daily  ? torsemide  100 mg Oral BID  ? ? ?PRN meds: ?sodium chloride, acetaminophen **OR** acetaminophen, ipratropium-albuterol, ondansetron **OR** ondansetron (ZOFRAN) IV, polyethylene glycol, sodium chloride, sodium chloride flush, traMADol, zolpidem  ? ?Antimicrobials: ?Anti-infectives (From admission, onward)  ? ? None  ? ?  ? ? ?Objective: ?Vitals:  ? 07/11/21 0752 07/11/21 1218  ?BP: 106/60 (!) 147/73  ?Pulse: (!) 123 84  ?Resp: 17 15  ?Temp: 97.7 ?F (36.5 ?C)   ?  SpO2: 98% 95%  ? ? ?Intake/Output Summary (Last 24 hours) at 07/11/2021 1404 ?Last data filed at 07/11/2021 1154 ?Gross per 24 hour  ?Intake 240 ml  ?Output 4750 ml  ?Net -4510 ml  ? ?Filed Weights  ? 07/08/21 0713 07/10/21 0501 07/11/21 0441  ?Weight: (!) 174.6 kg (!) 179.2 kg (!) 177.5 kg  ? ?Weight change: -1.724 kg ?Body mass index is 40.92 kg/m?.  ? ?Physical Exam: ?General exam: Well-developed nourished, alert and oriented x3. ?Skin: No rashes, lesions or ulcers. ?HEENT: Atraumatic, normocephalic, no obvious bleeding ?Lungs: Clear to station no wheezes or rales. ?CVS: Regular rate and rhythm no rubs or gallops ?GI/Abd soft bowel sounds present.  Nontender.   ?CNS: Alert, awake, oriented x 3 ?Psychiatry: Mood is appropriate ?Extremities: Unna boots in place, edema noted. ? ?Data Review: I have personally reviewed the laboratory data and studies available. ? ?F/u labs ordered  ?Unresulted Labs (From admission, onward)  ? ?  Start     Ordered  ? 07/11/21 9030  Basic metabolic panel  Once,   R       ?Question:  Specimen collection method  Answer:  Unit=Unit collect  ? 07/11/21 0853  ? 07/10/21  0923  Basic metabolic panel  Daily,   R     ? 07/09/21 1104  ? 07/10/21 0500  CBC  Daily,   R     ? 07/09/21 1104  ? 07/10/21 0500  Magnesium  Daily,   R     ? 07/09/21 1104  ? 07/09/21 1523  Protei

## 2021-07-11 NOTE — Plan of Care (Signed)
  Problem: Nutrition: Goal: Adequate nutrition will be maintained Outcome: Progressing   Problem: Coping: Goal: Level of anxiety will decrease Outcome: Progressing   

## 2021-07-11 NOTE — Progress Notes (Signed)
Mobility Specialist Progress Note ? ? 07/11/21 1715  ?Mobility  ?Activity Ambulated with assistance in hallway  ?Level of Assistance Contact guard assist, steadying assist  ?Assistive Device  ?Ethelene Hal)  ?Distance Ambulated (ft) 230 ft  ?Activity Response Tolerated well  ?$Mobility charge 1 Mobility  ? ?Pre Mobility: 78 HR, 128/67 BP ?During Mobility: 91 HR ?Post Mobility: 85 HR, 128/67 BP ? ?Received pt on EOB c/o slight BLE soreness but agreeable. Having to use eva walker d/t pt's height. X2 standing rest breaks d/t shoulder fatigue, requiring cues on small progressions for weight distribution in LE to relieve shoulder fatigue. Pt R arm began to cramp during the second break and requiring minA to stretch arm for relief. Returned back to the EOB w/o fault or further complaints. Left call bell in reach and notified RN.  ? ?Holland Falling ?Mobility Specialist ?Phone Number [phone] ? ?

## 2021-07-11 NOTE — TOC CM/SW Note (Signed)
HF TOC CM left PTAR paperwork on shadow chart. Will need HH RN, PT, OT orders with [facility]. Jonnie Finner RN3 CCM, Heart Failure TOC CM [phone]  ?

## 2021-07-11 NOTE — Progress Notes (Signed)
Patient ID: Charles Gray, male   DOB: 14-Jul-1955, 66 y.o.   MRN: [id number] ?  ? ? Advanced Heart Failure Rounding Note ? ?PCP-Cardiologist: Berniece Salines, DO  ?AHFC: Dr. [name]  ? ?Subjective:   ? ?Yesterday diuresed with IV lasix + diamox + metolazone.  Good diuresis, weight down 4 lbs. CVP is 6-7 this morning.  ? ?Not very mobile.  Denies dyspnea at rest.  ? ? ?Objective:   ?Weight Range: ?(!) 177.5 kg ?Body mass index is 40.92 kg/m?.  ? ?Vital Signs:   ?Temp:  [97.7 ?F (36.5 ?C)-98.3 ?F (36.8 ?C)] 97.7 ?F (36.5 ?C) (03/31 1607) ?Pulse Rate:  [67-123] 123 (03/31 0752) ?Resp:  [17-20] 17 (03/31 0752) ?BP: (106-119)/(57-75) 106/60 (03/31 0752) ?SpO2:  [94 %-100 %] 98 % (03/31 0752) ?Weight:  [177.5 kg] 177.5 kg (03/31 0441) ?Last BM Date : 07/11/21 ? ?Weight change: ?Filed Weights  ? 07/08/21 0713 07/10/21 0501 07/11/21 0441  ?Weight: (!) 174.6 kg (!) 179.2 kg (!) 177.5 kg  ? ? ?Intake/Output:  ? ?Intake/Output Summary (Last 24 hours) at 07/11/2021 0902 ?Last data filed at 07/11/2021 0751 ?Gross per 24 hour  ?Intake 480 ml  ?Output 4125 ml  ?Net -3645 ml  ?  ? ? ?Physical Exam  ? CVP 6-7 ?General: NAD ?Neck: No JVD, no thyromegaly or thyroid nodule.  ?Lungs: Clear to auscultation bilaterally with normal respiratory effort. ?CV: Nondisplaced PMI.  Heart regular S1/S2, no S3/S4, no murmur.  1+ chronic edema to knees.  ?Abdomen: Soft, nontender, no hepatosplenomegaly, no distention.  ?Skin: Intact without lesions or rashes.  ?Neurologic: Alert and oriented x 3.  ?Psych: Normal affect. ?Extremities: No clubbing or cyanosis.  ?HEENT: Normal.  ? ? ?Telemetry  ? ?NSR 70s (personally reviewed) ? ?EKG  ?  ?No new EKG to review  ? ?Labs  ?  ?CBC ?Recent Labs  ?  07/09/21 ?0451 07/10/21 ?3710 07/11/21 ?0431  ?WBC 5.5 5.7 7.4  ?NEUTROABS 3.8  --   --   ?HGB 8.9* 9.1* 9.1*  ?HCT 30.2* 32.0* 31.7*  ?MCV 74.9* 75.7* 75.5*  ?PLT 141* 133* 145*  ? ?Basic Metabolic Panel ?Recent Labs  ?  07/10/21 ?6269 07/11/21 ?0431  ?NA 138 134*  ?K  3.9 2.7*  ?CL 100 94*  ?CO2 29 31  ?GLUCOSE 127* 133*  ?BUN 10 9  ?CREATININE 1.12 1.10  ?CALCIUM 8.9 9.1  ?MG 2.2 2.2  ? ?Liver Function Tests ?Recent Labs  ?  07/09/21 ?0451  ?AST 33  ?ALT 16  ?ALKPHOS 68  ?BILITOT 1.1  ?PROT 6.6  ?ALBUMIN 2.8*  ? ?No results for input(s): LIPASE, AMYLASE in the last 72 hours. ?Cardiac Enzymes ?No results for input(s): CKTOTAL, CKMB, CKMBINDEX, TROPONINI in the last 72 hours. ? ?BNP: ?BNP (last 3 results) ?Recent Labs  ?  12/21/20 ?0052 04/06/21 ?1454 07/07/21 ?2236  ?BNP 27.2 82.2 79.9  ? ? ?ProBNP (last 3 results) ?No results for input(s): PROBNP in the last 8760 hours. ? ? ?D-Dimer ?Recent Labs  ?  07/09/21 ?0451  ?DDIMER 0.50  ? ?Hemoglobin A1C ?Recent Labs  ?  07/10/21 ?1200  ?HGBA1C 5.9*  ? ?Fasting Lipid Panel ?No results for input(s): CHOL, HDL, LDLCALC, TRIG, CHOLHDL, LDLDIRECT in the last 72 hours. ?Thyroid Function Tests ?No results for input(s): TSH, T4TOTAL, T3FREE, THYROIDAB in the last 72 hours. ? ?Invalid input(s): FREET3 ? ?Other results: ? ? ?Imaging  ? ? ?No results found. ? ? ?Medications:   ? ? ?Scheduled Medications: ? acetaZOLAMIDE  250 mg Oral Daily  ? apixaban  10 mg Oral BID  ? Followed by  ? [START ON 07/17/2021] apixaban  5 mg Oral BID  ? buprenorphine-naloxone  1 tablet Sublingual Daily  ? Chlorhexidine Gluconate Cloth  6 each Topical Daily  ? furosemide  80 mg Intravenous BID  ? gabapentin  800 mg Oral TID  ? insulin aspart  0-15 Units Subcutaneous TID WC  ? insulin aspart  0-5 Units Subcutaneous QHS  ? lactulose  10 g Oral TID  ? metolazone  2.5 mg Oral BID  ? metoprolol succinate  25 mg Oral QHS  ? pantoprazole  40 mg Oral BID AC  ? potassium chloride  40 mEq Oral TID  ? pravastatin  20 mg Oral QHS  ? sertraline  50 mg Oral Daily  ? sodium chloride flush  10-40 mL Intracatheter Q12H  ? spironolactone  25 mg Oral Daily  ? tamsulosin  0.4 mg Oral Daily  ? ? ?Infusions: ? sodium chloride 10 mL/hr at 07/09/21 1704  ? potassium chloride 10 mEq (07/11/21  0756)  ? ? ?PRN Medications: ?sodium chloride, acetaminophen **OR** acetaminophen, ipratropium-albuterol, ondansetron **OR** ondansetron (ZOFRAN) IV, polyethylene glycol, sodium chloride, sodium chloride flush, traMADol, zolpidem ? ? ? ?Patient Profile  ? ?66 y/o male w/ chronic diastolic heart failure w/ suspected RV failure, OSA, obesity, h/o PE/DVT, PAF, HTN and h/o poor compliance, admitted w/ a/c heart failure w/ fluid overload.  ? ?Assessment/Plan  ? ? ?1. Acute on Chronic Diastolic CHF with suspected RV dysfunction: Echo 7/22 with EF 60-65%, mild LVH, normal RV, unable to estimate PA systolic pressure. Multiple CHF admissions despite aggressive outpatient diuretic regimen and paramedicine.  RHC (7/22) with optimized filling pressures, PCWP 13, CO/CI 7.3/2.4. Received IV lasix 80 mg through paramedicine 11/12/20. RHC (8/22) with normal filling pressures; underwent placement of Cardiomems (dPAP goal 10 mmHg). Now admitted w/ marked volume overload and slight progression to NYHA Class lllb symptoms.  Suspect poor compliance w/ home diuretic regimen. Most recent Cardiomems transmissions have shown dPAP to be at or below goal, but exam more suggestive of R>L sided heart failure. BNP only 79 but may be falsely low in setting of morbid obesity. Echo EF 60-65% RV normal this admission.  He diuresed well over the last day, weight down 4 lbs.  CVP 6-7 on my read.  He still has a lot of peripheral edema but think that may be more due to chronic venous insufficiency at this point than CHF.  ?- Stop Lasix, start back on home regimen of torsemide 100 mg bid and metolazone 2.5 M/W/F.  ?- Aggressive K supplementation today, repeat BMET in pm. When he goes home, should go back to home K regimen.   ?- Can stop Diamox.  ?- Replace Unna boots, would be ideal to continue these at home.  ?- Continue Spironolactone 25 mg daily, switch to eplerenone when he goes home.  ?- Off Jardiance (GI upset). ?- Continue Toprol XL 25 mg daily   ?- At d/c will need to lower cardiomems goal.  ?2. H/o PE/DVT: Suspect due to sedentary lifestyle.  Occurred in 2/21.  V/Q scan in 6/21 did not show evidence for chronic PE.  CT scan this admit limited because of suboptimal opacification and artifact, however is suggestive of bilateral lower lobe segmental and subsegmental pulm emboli but no evidence of Rt heart strain but not certain of diagnosis due to quality of scan. Unable to get V/Q scan this admission due to  size. LE venous dopplers negative for DVT ?- Discussed with [facility], would continue Eliquis for now, make sure he is fully compliant.  ?3. OSA: Cannot tolerate CPAP. ?4. Anxiety/depression: This is a major issue.   ?- Continue sertraline 50 mg daily.  ?5. CAD: Cath 1/22 with no significant CAD. No ischemic CP. HS trop this admit negative x 2. No chest pain.  ?- on statin + ? blocker.  ?6. Hypertension: Stable. ?- Continue Toprol XL 25 mg bid.   ?- Continue spironlactone 25 mg daily  ?7. Atrial fibrillation: Paroxysmal. In SR today.  ?- Continue apixaban. No bleeding issues. ?- Continue Toprol XL. ?8. Bilateral Lower extremity erythema: Admitted 5/22 with possible BLE cellulitis. Since then, he has been on multiple rounds of Keflex, Levaquin and doxycycline over the last several months. Previously discussed with Janene Madeira, NP with ID, and recommended starting cefadroxil 1000 mg bid x 7 days, however he took this for 2 days and stopped due to GI upset. Compliance has remained a barrier. He continues w/ b/l LE erythema on exam but suspect most likely due to chronic venous stasis/ dependent rubor + CHF. Doubt cellulitis. WBC normal.  ?- c/w unna boots ?9. Cirrhosis: CT scan noted  nodular contour of liver, compatible with underlying cirrhosis. In setting of known RV dysfunction. LFTs ok. EGD 12/22 showed Portal hypertensive gastropathy. No varices. NH3 mildly elevated, 38 w/ mild confusion on admit.  ?- Lactulose ordered by primary team (also takes this  for chronic constipation)  ?- on spiro + ? blocker  ?10. Anemia: Hgb 10.2>>8.9  >>9.1. Iron sats 5%.  ?- Received feraheme.  ? ?Transition back to po diuretics with CVP down. Suspect venous insufficiency

## 2021-07-11 NOTE — Progress Notes (Signed)
Physical Therapy Treatment ?Patient Details ?Name: Charles Gray ?MRN: [id number] ?DOB: [birth year] ?Today's Date: 07/11/2021 ? ? ?History of Present Illness Pt is a 66 y.o. M who presents 07/07/2021 with heat failure with fluid overload.  CT scan this admit limited because of suboptimal opacification and artifact, however is suggestive of bilateral lower lobe segmental and subsegmental pulm emboli but no evidence of Rt heart strain. Follow up V/Q scan has been ordered. LE veous dopplers negative for DVT. Significant PMH: chronic diastolic heart failure, OSA, obesity, history of PE/DVT, PAF, HTN. ? ?  ?PT Comments  ? ? Patient progressing slowly towards PT goals. Reports he still has swelling in bil feet and LEs making donning socks challenging. Continues to report weakness in BLEs as well. Tolerated gait training with Min guard assist for safety and use of RW but limited due to cramping in right hand and needing a few standing rest breaks. Encouraged increasing activity level. VSS on RA. Will follow. ?   ?Recommendations for follow up therapy are one component of a multi-disciplinary discharge planning process, led by the attending physician.  Recommendations may be updated based on patient status, additional functional criteria and insurance authorization. ? ?Follow Up Recommendations ? Home health PT ?  ?  ?Assistance Recommended at Discharge PRN  ?Patient can return home with the following A little help with bathing/dressing/bathroom;Assistance with cooking/housework;Assist for transportation;Help with stairs or ramp for entrance ?  ?Equipment Recommendations ? None recommended by PT  ?  ?Recommendations for Other Services   ? ? ?  ?Precautions / Restrictions Precautions ?Precautions: Fall ?Restrictions ?Weight Bearing Restrictions: No  ?  ? ?Mobility ? Bed Mobility ?  ?  ?  ?  ?  ?  ?  ?General bed mobility comments: Sitting EOB upon arrival ?  ? ?Transfers ?Overall transfer level: Modified independent ?Equipment  used: Rolling walker (2 wheels) ?  ?  ?  ?  ?  ?  ?  ?General transfer comment: Slow to rise, no assist needed. ?  ? ?Ambulation/Gait ?Ambulation/Gait assistance: Min guard ?Gait Distance (Feet): 80 Feet ?Assistive device: Rolling walker (2 wheels) ?Gait Pattern/deviations: Step-through pattern, Decreased stride length, Trunk flexed ?Gait velocity: decreased ?  ?  ?General Gait Details: Slow, mostly steady gait with flexed posture and taking UEs off RW due to cramping with WB through RUE. Decreased foot clearance bilaterally. A few standing rest breaks. ? ? ?Stairs ?  ?  ?  ?  ?  ? ? ?Wheelchair Mobility ?  ? ?Modified Rankin (Stroke Patients Only) ?  ? ? ?  ?Balance Overall balance assessment: Needs assistance ?Sitting-balance support: Feet supported, No upper extremity supported ?Sitting balance-Leahy Scale: Good ?Sitting balance - Comments: Able to bring foot up onto thigh to assist with donning socks ?  ?Standing balance support: During functional activity ?Standing balance-Leahy Scale: Poor ?Standing balance comment: reliant on at least single UE support ?  ?  ?  ?  ?  ?  ?  ?  ?  ?  ?  ?  ? ?  ?Cognition Arousal/Alertness: Awake/alert ?Behavior During Therapy: Flat affect ?Overall Cognitive Status: Within Functional Limits for tasks assessed ?  ?  ?  ?  ?  ?  ?  ?  ?  ?  ?  ?  ?  ?  ?  ?  ?  ?  ?  ? ?  ?Exercises   ? ?  ?General Comments General comments (skin integrity, edema, etc.): Bil unna boots,  swelling present in Bil feet and LEs. ?  ?  ? ?Pertinent Vitals/Pain Pain Assessment ?Pain Assessment: 0-10 ?Pain Score: 9  ?Pain Location: right forearm/hand ?Pain Descriptors / Indicators: Grimacing, Cramping ?Pain Intervention(s): Monitored during session, Limited activity within patient's tolerance  ? ? ?Home Living   ?  ?  ?  ?  ?  ?  ?  ?  ?  ?   ?  ?Prior Function    ?  ?  ?   ? ?PT Goals (current goals can now be found in the care plan section) Progress towards PT goals: Progressing toward goals ? ?   ?Frequency ? ? ? Min 3X/week ? ? ? ?  ?PT Plan Current plan remains appropriate  ? ? ?Co-evaluation   ?  ?  ?  ?  ? ?  ?AM-PAC PT "6 Clicks" Mobility   ?Outcome Measure ? Help needed turning from your back to your side while in a flat bed without using bedrails?: None ?Help needed moving from lying on your back to sitting on the side of a flat bed without using bedrails?: A Little ?Help needed moving to and from a bed to a chair (including a wheelchair)?: A Little ?Help needed standing up from a chair using your arms (e.g., wheelchair or bedside chair)?: A Little ?Help needed to walk in hospital room?: A Little ?Help needed climbing 3-5 steps with a railing? : A Lot ?6 Click Score: 18 ? ?  ?End of Session   ?Activity Tolerance: Patient limited by pain ?Patient left: in bed;with call bell/phone within reach (sitting EOB) ?Nurse Communication: Mobility status ?PT Visit Diagnosis: Unsteadiness on feet (R26.81);Muscle weakness (generalized) (M62.81);Difficulty in walking, not elsewhere classified (R26.2) ?  ? ? ?Time: 3112-1624 ?PT Time Calculation (min) (ACUTE ONLY): 19 min ? ?Charges:  $Gait Training: 8-22 mins          ?          ? ?Marisa Severin, PT, DPT ?Acute Rehabilitation Services ?Pager [phone] ?Office [phone] ? ? ? ? ? ?[place] ?07/11/2021, 1:15 PM ? ?

## 2021-07-12 DIAGNOSIS — I2699 Other pulmonary embolism without acute cor pulmonale: Secondary | ICD-10-CM | POA: Diagnosis not present

## 2021-07-12 DIAGNOSIS — I5032 Chronic diastolic (congestive) heart failure: Secondary | ICD-10-CM | POA: Diagnosis not present

## 2021-07-12 LAB — BASIC METABOLIC PANEL
Anion gap: 10 (ref 5–15)
Anion gap: 14 (ref 5–15)
BUN: 11 mg/dL (ref 8–23)
BUN: 13 mg/dL (ref 8–23)
CO2: 35 mmol/L — ABNORMAL HIGH (ref 22–32)
CO2: 33 mmol/L — ABNORMAL HIGH (ref 22–32)
Calcium: 9.5 mg/dL (ref 8.9–10.3)
Calcium: 9.5 mg/dL (ref 8.9–10.3)
Chloride: 91 mmol/L — ABNORMAL LOW (ref 98–111)
Chloride: 88 mmol/L — ABNORMAL LOW (ref 98–111)
Creatinine, Ser: 1.18 mg/dL (ref 0.61–1.24)
Creatinine, Ser: 1.23 mg/dL (ref 0.61–1.24)
GFR, Estimated: >60 mL/min (ref >60)
GFR, Estimated: >60 mL/min (ref >60)
Glucose, Bld: 122 mg/dL — ABNORMAL HIGH (ref 70–99)
Glucose, Bld: 100 mg/dL — ABNORMAL HIGH (ref 70–99)
Potassium: 2.6 mmol/L — CL (ref 3.5–5.1)
Potassium: 2.3 mmol/L — CL (ref 3.5–5.1)
Sodium: 136 mmol/L (ref 135–145)
Sodium: 135 mmol/L (ref 135–145)

## 2021-07-12 LAB — CULTURE, BLOOD (ROUTINE X 2)
Culture: NO GROWTH
Culture: NO GROWTH
Special Requests: ADEQUATE

## 2021-07-12 LAB — GLUCOSE, CAPILLARY
Glucose-Capillary: 145 mg/dL — ABNORMAL HIGH (ref 70–99)
Glucose-Capillary: 158 mg/dL — ABNORMAL HIGH (ref 70–99)
Glucose-Capillary: 127 mg/dL — ABNORMAL HIGH (ref 70–99)
Glucose-Capillary: 147 mg/dL — ABNORMAL HIGH (ref 70–99)

## 2021-07-12 LAB — CBC
HCT: 33.5 % — ABNORMAL LOW (ref 39.0–52.0)
Hemoglobin: 10.1 g/dL — ABNORMAL LOW (ref 13.0–17.0)
MCH: 22 pg — ABNORMAL LOW (ref 26.0–34.0)
MCHC: 30.1 g/dL (ref 30.0–36.0)
MCV: 73 fL — ABNORMAL LOW (ref 80.0–100.0)
Platelets: 155 10*3/uL (ref 150–400)
RBC: 4.59 MIL/uL (ref 4.22–5.81)
RDW: 18.2 % — ABNORMAL HIGH (ref 11.5–15.5)
WBC: 9.7 10*3/uL (ref 4.0–10.5)
nRBC: 0 % (ref 0.0–0.2)

## 2021-07-12 LAB — MAGNESIUM: Magnesium: 2 mg/dL (ref 1.7–2.4)

## 2021-07-12 MED ORDER — POTASSIUM CHLORIDE 10 MEQ/100ML IV SOLN
10.0000 meq | INTRAVENOUS | Status: AC
  Administered 2021-07-12 (×4): 10 meq via INTRAVENOUS
  Filled 2021-07-12 (×3): qty 100

## 2021-07-12 MED ORDER — POTASSIUM CHLORIDE 10 MEQ/100ML IV SOLN
10.0000 meq | INTRAVENOUS | Status: AC
  Administered 2021-07-12 (×2): 10 meq via INTRAVENOUS
  Filled 2021-07-12: qty 100

## 2021-07-12 MED ORDER — POTASSIUM CHLORIDE 20 MEQ PO PACK
40.0000 meq | PACK | Freq: Once | ORAL | Status: AC
  Administered 2021-07-12: 40 meq via ORAL
  Filled 2021-07-12: qty 2

## 2021-07-12 MED ORDER — POTASSIUM CHLORIDE CRYS ER 20 MEQ PO TBCR
40.0000 meq | EXTENDED_RELEASE_TABLET | Freq: Three times a day (TID) | ORAL | Status: DC
  Administered 2021-07-12 – 2021-07-13 (×3): 40 meq via ORAL
  Filled 2021-07-12 (×3): qty 2

## 2021-07-12 NOTE — Progress Notes (Signed)
?PROGRESS NOTE ? ?Charles Gray  ?DOB: 1955/07/10  ?PCP: Enid Skeens., MD ?KGY:[id number]  ?DOA: 07/07/2021 ? LOS: 0 days  ?Hospital Day: 6 ? ?Brief narrative: ?Charles Gray is a 66 y.o. male with PMH significant for morbid obesity, OSA, HTN, HLD, chronic diastolic CHF, history of DVT/PE, A-fib on anticoagulation, COPD, liver cirrhosis, chronic pain on opioids, chronic constipation ?Patient presented to ED on 3/27 with complaint of bilateral lower extremity redness and swelling, progressively worsening for several days also accompanied by increasing shortness of breath for last few days. ?He received a course of oral antibiotics as an outpatient for suspected leg cellulitis without improvement. ? ?In the ED, patient was afebrile, hemodynamically stable ?CT angio of chest was suspected bilateral lower lobe segmental and subsegmental pulm emboli.  Of note, patient is on scheduled Eliquis and reports compliance to it. ?On clinical examination, patient was noted to be grossly volume overloaded. ?Admitted to hospital service. ?CHF service was consulted. ?Currently getting aggressively diuresed. ?  ?Subjective: ?Patient was seen at his bedside.  Potassium is low, on diuretics.  Potassium is being repleted orally and intravenously. ? ?Principal Problem: ?  Acute pulmonary embolism ([place]) ?Active Problems: ?  Chronic diastolic HF (heart failure) ([place]) ?  Paroxysmal atrial fibrillation ([facility]) ?  COPD with acute exacerbation ([place]) ?  Lymphedema ?  Essential hypertension ?  GERD without esophagitis ?  ? ?Assessment and Plan: ?Acute on Chronic diastolic CHF ?Essential hypertension ?-Patient reports progressively worsening bilateral lower extremity edema limiting his ability to move.   ?-Ultrasound duplex lower extremities ruled out DVT ?-Patient has similar presentation in the past multiple times for CHF exacerbation ?-Cardiac consultation was obtained.  Patient was started on IV Lasix. ?-1.4 L urine output in last 24  hours, about 4 L since admission ?-3/29, echocardiogram with EF 60 to 65%, moderate to severely dilated left ventricular cavity, no wall motion abnormality. ?-PTA, patient was on Toprol 25 mg daily, torsemide 100 mg twice daily, metolazone 2.5 mg daily MWF, eplerenone 25 mg daily ?-Continue Toprol, Aldactone along with IV Lasix. ?-Compression stockings on both legs. ?-Net IO Since Admission: -11,828.39 mL [07/12/21 0929] ?-Continue to monitor for daily intake output, weight, blood pressure, BNP, renal function and electrolytes. ?Recent Labs  ?Lab 07/07/21 ?1827 07/07/21 ?2236 07/09/21 ?0451 07/09/21 ?1355 07/10/21 ?0263 07/11/21 ?0431 07/11/21 ?1710 07/12/21 ?7858  ?BNP  --  79.9  --   --   --   --   --   --   ?BUN  --   --  12 11 10 9 10 11   ?CREATININE  --   --  1.06 1.13 1.12 1.10 1.05 1.18  ?K  --   --  2.9* 3.1* 3.9 2.7* 3.2* 2.6*  ?MG 1.8  --  2.2  --  2.2 2.2  --  2.0  ? ?Refractory hypokalemia ?-Patient regimen requires high dose of potassium replacement daily. ?Replace electrolytes as indicated. ?Serum potassium 2.6 this morning, repleted intravenously and orally. ?Magnesium 2.2 ?-Continue to monitor ?Recent Labs  ?Lab 07/07/21 ?1827 07/09/21 ?0451 07/09/21 ?1355 07/10/21 ?8502 07/11/21 ?0431 07/11/21 ?1710 07/12/21 ?7741  ?K  --  2.9* 3.1* 3.9 2.7* 3.2* 2.6*  ?MG 1.8 2.2  --  2.2 2.2  --  2.0  ?Acute pulmonary embolism ?History of DVT/PE on Eliquis ?-Patient has history of DVT/PE and reports compliance to Eliquis at home.   ?-CT angio of chest was suboptimal and suspected bilateral lower lobe segmental and subsegmental PE.  ?-Ultrasound duplex of  lower extremities negative for DVT. Unable to do VQ scan because of his weight. ?-On admission, patient was started on heparin drip. ?-I discussed with cardiologist Dr. [name].  Within, patient's respiratory distress is primarily due to volume overload and not PE.  Decision was made to continue Eliquis at this time. ?-Switch from heparin drip to Eliquis on  3/30. ?No hemoptysis or chest pain reported. ?Continue to closely monitor on telemetry. ? ?Paroxysmal atrial fibrillation ?-Currently rate controlled metoprolol. ?Eliquis for CVA prevention ?-Continue to monitor in telemetry ? ?Hyperlipidemia ?-Pravastatin 20 mg daily ? ?COPD  ?-Not in acute exacerbation  ?Continue bronchodilators.   ?Not hypoxic, O2 saturation 95% on ambient air. ? ?GERD without esophagitis ?-Continue PPI ? ?Chronic pain ?-PTA, patient was on Suboxone 1 tab sublingual twice daily, Neurontin 800 mg 3 times daily, ? ?Chronic constipation ?-PTA, patient was on lactulose 10 g 3 times daily, MiraLAX daily ? ?Anxiety/depression ?-Zoloft 50 mg daily ? ?BPH ?-0.4 mg daily ? ?Chronic lower extremity edema/chronic venous stasis ?Continue Unna boots ?Elevate lower extremities ? ? ?Goals of care ?  Code Status: Full Code  ? ?Mobility: Encourage ambulation. PT eval ordered. ? ?Nutritional status:  ?Body mass index is 39.89 kg/m?.  ?  ?  ? ? ? ? ?Diet:  ?Diet Order   ? ?       ?  Diet Heart Room service appropriate? Yes; Fluid consistency: Thin  Diet effective now       ?  ? ?  ?  ? ?  ? ? ?DVT prophylaxis: Eliquis ? ?apixaban (ELIQUIS) tablet 10 mg  ?apixaban (ELIQUIS) tablet 5 mg Antimicrobials: None ?Fluid: None ?Consultants: Cardiology  ?Family Communication: Family not at bedside ? ?Status is: Observation ? ?Continue in-hospital care because: Needs volume management ?Level of care: Telemetry Medical  ? ?Dispo: The patient is from: Home ?             Anticipated d/c is to: Pending clinical course and PT eval ?             Patient currently is not medically stable to d/c. ?  Difficult to place patient No ? ? ? ? ?Infusions:  ? sodium chloride 10 mL/hr at 07/12/21 0734  ? potassium chloride 10 mEq (07/12/21 0837)  ? ? ?Scheduled Meds: ? apixaban  10 mg Oral BID  ? Followed by  ? [START ON 07/17/2021] apixaban  5 mg Oral BID  ? buprenorphine-naloxone  1 tablet Sublingual Daily  ? Chlorhexidine Gluconate Cloth  6  each Topical Daily  ? gabapentin  800 mg Oral TID  ? insulin aspart  0-15 Units Subcutaneous TID WC  ? insulin aspart  0-5 Units Subcutaneous QHS  ? lactulose  10 g Oral TID  ? metoprolol succinate  25 mg Oral QHS  ? pantoprazole  40 mg Oral BID AC  ? potassium chloride  60 mEq Oral TID  ? pravastatin  20 mg Oral QHS  ? sertraline  50 mg Oral Daily  ? sodium chloride flush  10-40 mL Intracatheter Q12H  ? spironolactone  25 mg Oral Daily  ? tamsulosin  0.4 mg Oral Daily  ? torsemide  100 mg Oral BID  ? ? ?PRN meds: ?sodium chloride, acetaminophen **OR** acetaminophen, ipratropium-albuterol, ondansetron **OR** ondansetron (ZOFRAN) IV, polyethylene glycol, sodium chloride, sodium chloride flush, traMADol, zolpidem  ? ?Antimicrobials: ?Anti-infectives (From admission, onward)  ? ? None  ? ?  ? ? ?Objective: ?Vitals:  ? 07/12/21 0449 07/12/21 0910  ?  BP: 121/79 (!) 100/52  ?Pulse: 82 72  ?Resp: 14 16  ?Temp: 98 ?F (36.7 ?C) 98.2 ?F (36.8 ?C)  ?SpO2: 97% 96%  ? ? ?Intake/Output Summary (Last 24 hours) at 07/12/2021 0929 ?Last data filed at 07/12/2021 0910 ?Gross per 24 hour  ?Intake 480 ml  ?Output 5875 ml  ?Net -5395 ml  ? ?Filed Weights  ? 07/10/21 0501 07/11/21 0441 07/12/21 0449  ?Weight: (!) 179.2 kg (!) 177.5 kg (!) 173 kg  ? ?Weight change: -4.445 kg ?Body mass index is 39.89 kg/m?.  ? ?Physical Exam: No significant changes from prior exam. ?General exam: Well-developed nourished, alert and oriented x3. ?Skin: No rashes, lesions or ulcers. ?HEENT: Atraumatic, normocephalic, no obvious bleeding ?Lungs: Clear to station no wheezes or rales. ?CVS: Regular rate and rhythm no rubs or gallops ?GI/Abd soft bowel sounds present.  Nontender.   ?CNS: Alert, awake, oriented x 3 ?Psychiatry: Mood is appropriate ?Extremities: Unna boots in place, edema noted. ? ?Data Review: I have personally reviewed the laboratory data and studies available. ? ?F/u labs ordered  ?Unresulted Labs (From admission, onward)  ? ?  Start     Ordered  ?  07/12/21 1200  Potassium  Once-Timed,   TIMED       ?Question:  Specimen collection method  Answer:  Unit=Unit collect  ? 07/12/21 0726  ? 07/10/21 1901  Basic metabolic panel  Daily,   R     ? 07/09/21 1104  ?

## 2021-07-12 NOTE — Progress Notes (Signed)
Mobility Specialist Progress Note  ? ? 07/12/21 1052  ?Mobility  ?Activity Ambulated with assistance in hallway  ?Level of Assistance Contact guard assist, steadying assist  ?Assistive Device Other (Comment) ?Ethelene Hal)  ?Distance Ambulated (ft) 230 ft  ?Activity Response Tolerated fair  ?$Mobility charge 1 Mobility  ? ?Pt received sitting EOB and agreeable. C/o R knee and back pain. Took x2 short standing rest breaks. Upon return c/o cramping around PICC line site. Left sitting EOB with call bell in reach.  ? ?Hildred Alamin ?Mobility Specialist  ?  ?

## 2021-07-12 NOTE — Progress Notes (Signed)
K+ came back low again at 2.3 this PM on PO replacement. D/w Dr. [place] Crane and we will give an additional 4 runs IV.  ? ?Onnie Boer, PharmD, BCIDP, AAHIVP, CPP ?Infectious Disease Pharmacist ?07/12/2021 5:01 PM ? ? ?

## 2021-07-12 NOTE — Progress Notes (Signed)
Patient's potassium is 2.6 this morning. Notified DR. Garing and awaiting for some some orders.  ?

## 2021-07-12 NOTE — Progress Notes (Signed)
? ?Progress Note ? ?Patient Name: Charles Gray ?Date of Encounter: 07/12/2021 ? ?[facility] Cardiologist: Berniece Salines, DO  ? ?Subjective  ? ?No CP; mild dyspnea; constipated and "upset stomach". ? ?Inpatient Medications  ?  ?Scheduled Meds: ? apixaban  10 mg Oral BID  ? Followed by  ? [START ON 07/17/2021] apixaban  5 mg Oral BID  ? buprenorphine-naloxone  1 tablet Sublingual Daily  ? Chlorhexidine Gluconate Cloth  6 each Topical Daily  ? gabapentin  800 mg Oral TID  ? insulin aspart  0-15 Units Subcutaneous TID WC  ? insulin aspart  0-5 Units Subcutaneous QHS  ? lactulose  10 g Oral TID  ? metoprolol succinate  25 mg Oral QHS  ? pantoprazole  40 mg Oral BID AC  ? potassium chloride  60 mEq Oral TID  ? pravastatin  20 mg Oral QHS  ? sertraline  50 mg Oral Daily  ? sodium chloride flush  10-40 mL Intracatheter Q12H  ? spironolactone  25 mg Oral Daily  ? tamsulosin  0.4 mg Oral Daily  ? torsemide  100 mg Oral BID  ? ?Continuous Infusions: ? sodium chloride 10 mL/hr at 07/12/21 0734  ? ?PRN Meds: ?sodium chloride, acetaminophen **OR** acetaminophen, ipratropium-albuterol, ondansetron **OR** ondansetron (ZOFRAN) IV, polyethylene glycol, sodium chloride, sodium chloride flush, traMADol, zolpidem  ? ?Vital Signs  ?  ?Vitals:  ? 07/11/21 2352 07/12/21 0449 07/12/21 0910 07/12/21 1150  ?BP: 111/63 121/79 (!) 100/52 108/66  ?Pulse: 70 82 72 82  ?Resp: 16 14 16    ?Temp: 97.9 ?F (36.6 ?C) 98 ?F (36.7 ?C) 98.2 ?F (36.8 ?C) 98 ?F (36.7 ?C)  ?TempSrc: Oral Oral    ?SpO2: 100% 97% 96% 98%  ?Weight:  (!) 173 kg    ?Height:      ? ? ?Intake/Output Summary (Last 24 hours) at 07/12/2021 1152 ?Last data filed at 07/12/2021 0910 ?Gross per 24 hour  ?Intake 480 ml  ?Output 5075 ml  ?Net -4595 ml  ? ? ?  07/12/2021  ?  4:49 AM 07/11/2021  ?  4:41 AM 07/10/2021  ?  5:01 AM  ?Last 3 Weights  ?Weight (lbs) 381 lb 8 oz 391 lb 4.8 oz 395 lb 1.6 oz  ?Weight (kg) 173.047 kg 177.493 kg 179.216 kg  ?   ? ?Telemetry  ?  ?Sinus - Personally  Reviewed ? ?Physical Exam  ? ?GEN: No acute distress.  obese ?Neck: No JVD ?Cardiac: RRR, no murmurs, rubs, or gallops.  ?Respiratory: Clear to auscultation bilaterally. ?GI: Soft, nontender, non-distended  ?MS: Wrapped; 1+ edema ?Neuro:  Nonfocal  ?Psych: Normal affect  ? ?Labs  ?  ?High Sensitivity Troponin:   ?Recent Labs  ?Lab 07/07/21 ?2236 07/08/21 ?0003  ?TROPONINIHS 7 6  ?   ?Chemistry ?Recent Labs  ?Lab 07/07/21 ?1616 07/07/21 ?1827 07/09/21 ?0451 07/09/21 ?1355 07/10/21 ?2353 07/11/21 ?0431 07/11/21 ?1710 07/12/21 ?6144  ?NA 138  --  137   < > 138 134* 133* 136  ?K 3.4*  --  2.9*   < > 3.9 2.7* 3.2* 2.6*  ?CL 98  --  96*   < > 100 94* 89* 91*  ?CO2 30  --  33*   < > 29 31 29  35*  ?GLUCOSE 120*  --  108*   < > 127* 133* 150* 122*  ?BUN 10  --  12   < > 10 9 10 11   ?CREATININE 1.10  --  1.06   < > 1.12  1.10 1.05 1.18  ?CALCIUM 8.8*  --  8.6*   < > 8.9 9.1 9.0 9.5  ?MG  --    < > 2.2  --  2.2 2.2  --  2.0  ?PROT 7.5  --  6.6  --   --   --   --   --   ?ALBUMIN 3.2*  --  2.8*  --   --   --   --   --   ?AST 34  --  33  --   --   --   --   --   ?ALT 18  --  16  --   --   --   --   --   ?ALKPHOS 79  --  68  --   --   --   --   --   ?BILITOT 1.1  --  1.1  --   --   --   --   --   ?GFRNONAA >60  --  >60   < > >60 >60 >60 >60  ?ANIONGAP 10  --  8   < > 9 9 15 10   ? < > = values in this interval not displayed.  ?  ? ?Hematology ?Recent Labs  ?Lab 07/10/21 ?0211 07/11/21 ?0431 07/12/21 ?1735  ?WBC 5.7 7.4 9.7  ?RBC 4.23 4.20* 4.59  ?HGB 9.1* 9.1* 10.1*  ?HCT 32.0* 31.7* 33.5*  ?MCV 75.7* 75.5* 73.0*  ?MCH 21.5* 21.7* 22.0*  ?MCHC 28.4* 28.7* 30.1  ?RDW 17.7* 17.5* 18.2*  ?PLT 133* 145* 155  ? ? ?BNP ?Recent Labs  ?Lab 07/07/21 ?2236  ?BNP 79.9  ?  ?DDimer  ?Recent Labs  ?Lab 07/09/21 ?0451  ?DDIMER 0.50  ?  ? ?Patient Profile  ? ?66 y/o male w/ chronic diastolic heart failure w/ suspected RV failure, OSA, obesity, h/o PE/DVT, PAF, HTN and h/o poor compliance, admitted w/ a/c heart failure w/ fluid overload.   Echocardiogram this admission shows ejection fraction 60 to 65%, moderate to severe left ventricular enlargement, mild left ventricular hypertrophy, mild left atrial enlargement, trace mitral regurgitation. ? ?Assessment & Plan  ? ?1 acute on chronic diastolic congestive heart failure-I/O-5235; wt 173 kg.  Plan to continue Demadex and spironolactone at present dose.  Continue to follow renal function. ? ?2 hypokalemia-supplement. ? ?3 PE/DVT-continue apixaban. ? ?4 obstructive sleep apnea-continue CPAP. ? ?5 coronary artery disease-continue statin. ? ?6 hypertension-blood pressure controlled.  Continue present medications. ? ?7 history of paroxysmal atrial fibrillation-patient now off of telemetry.  Continue apixaban and beta-blocker. ? ? ?Plan to discharge when potassium stable. ? ?For questions or updates, please contact [place] ?Please consult [email or web address] for contact info under  ? ?  ?   ?Signed, ?Kirk Ruths, MD  ?07/12/2021, 11:52 AM   ? ?

## 2021-07-13 DIAGNOSIS — I5033 Acute on chronic diastolic (congestive) heart failure: Secondary | ICD-10-CM | POA: Diagnosis not present

## 2021-07-13 DIAGNOSIS — G4733 Obstructive sleep apnea (adult) (pediatric): Secondary | ICD-10-CM | POA: Diagnosis present

## 2021-07-13 DIAGNOSIS — I5032 Chronic diastolic (congestive) heart failure: Secondary | ICD-10-CM | POA: Diagnosis not present

## 2021-07-13 DIAGNOSIS — J441 Chronic obstructive pulmonary disease with (acute) exacerbation: Secondary | ICD-10-CM | POA: Diagnosis not present

## 2021-07-13 DIAGNOSIS — I890 Lymphedema, not elsewhere classified: Secondary | ICD-10-CM | POA: Diagnosis not present

## 2021-07-13 DIAGNOSIS — K7460 Unspecified cirrhosis of liver: Secondary | ICD-10-CM | POA: Diagnosis not present

## 2021-07-13 DIAGNOSIS — Z6841 Body Mass Index (BMI) 40.0 and over, adult: Secondary | ICD-10-CM | POA: Diagnosis not present

## 2021-07-13 DIAGNOSIS — I10 Essential (primary) hypertension: Secondary | ICD-10-CM | POA: Diagnosis not present

## 2021-07-13 DIAGNOSIS — D509 Iron deficiency anemia, unspecified: Secondary | ICD-10-CM | POA: Diagnosis not present

## 2021-07-13 DIAGNOSIS — F32A Depression, unspecified: Secondary | ICD-10-CM | POA: Diagnosis present

## 2021-07-13 DIAGNOSIS — E871 Hypo-osmolality and hyponatremia: Secondary | ICD-10-CM | POA: Diagnosis present

## 2021-07-13 DIAGNOSIS — I110 Hypertensive heart disease with heart failure: Secondary | ICD-10-CM | POA: Diagnosis present

## 2021-07-13 DIAGNOSIS — I480 Paroxysmal atrial fibrillation: Secondary | ICD-10-CM | POA: Diagnosis not present

## 2021-07-13 DIAGNOSIS — E876 Hypokalemia: Secondary | ICD-10-CM | POA: Diagnosis present

## 2021-07-13 DIAGNOSIS — D638 Anemia in other chronic diseases classified elsewhere: Secondary | ICD-10-CM | POA: Diagnosis present

## 2021-07-13 DIAGNOSIS — R531 Weakness: Secondary | ICD-10-CM | POA: Diagnosis not present

## 2021-07-13 DIAGNOSIS — I2699 Other pulmonary embolism without acute cor pulmonale: Secondary | ICD-10-CM | POA: Diagnosis not present

## 2021-07-13 DIAGNOSIS — F419 Anxiety disorder, unspecified: Secondary | ICD-10-CM | POA: Diagnosis present

## 2021-07-13 DIAGNOSIS — I2510 Atherosclerotic heart disease of native coronary artery without angina pectoris: Secondary | ICD-10-CM | POA: Diagnosis present

## 2021-07-13 DIAGNOSIS — Z743 Need for continuous supervision: Secondary | ICD-10-CM | POA: Diagnosis not present

## 2021-07-13 DIAGNOSIS — K219 Gastro-esophageal reflux disease without esophagitis: Secondary | ICD-10-CM | POA: Diagnosis not present

## 2021-07-13 DIAGNOSIS — Z20822 Contact with and (suspected) exposure to covid-19: Secondary | ICD-10-CM | POA: Diagnosis present

## 2021-07-13 DIAGNOSIS — N179 Acute kidney failure, unspecified: Secondary | ICD-10-CM | POA: Diagnosis not present

## 2021-07-13 DIAGNOSIS — I444 Left anterior fascicular block: Secondary | ICD-10-CM | POA: Diagnosis present

## 2021-07-13 LAB — BASIC METABOLIC PANEL
Anion gap: 14 (ref 5–15)
Anion gap: 15 (ref 5–15)
BUN: 14 mg/dL (ref 8–23)
BUN: 15 mg/dL (ref 8–23)
CO2: 34 mmol/L — ABNORMAL HIGH (ref 22–32)
CO2: 33 mmol/L — ABNORMAL HIGH (ref 22–32)
Calcium: 9.2 mg/dL (ref 8.9–10.3)
Calcium: 9.4 mg/dL (ref 8.9–10.3)
Chloride: 85 mmol/L — ABNORMAL LOW (ref 98–111)
Chloride: 85 mmol/L — ABNORMAL LOW (ref 98–111)
Creatinine, Ser: 1.3 mg/dL — ABNORMAL HIGH (ref 0.61–1.24)
Creatinine, Ser: 1.18 mg/dL (ref 0.61–1.24)
GFR, Estimated: >60 mL/min (ref >60)
GFR, Estimated: >60 mL/min (ref >60)
Glucose, Bld: 120 mg/dL — ABNORMAL HIGH (ref 70–99)
Glucose, Bld: 131 mg/dL — ABNORMAL HIGH (ref 70–99)
Potassium: 2.5 mmol/L — CL (ref 3.5–5.1)
Potassium: 2.4 mmol/L — CL (ref 3.5–5.1)
Sodium: 133 mmol/L — ABNORMAL LOW (ref 135–145)
Sodium: 133 mmol/L — ABNORMAL LOW (ref 135–145)

## 2021-07-13 LAB — CBC
HCT: 32.9 % — ABNORMAL LOW (ref 39.0–52.0)
Hemoglobin: 10 g/dL — ABNORMAL LOW (ref 13.0–17.0)
MCH: 22.1 pg — ABNORMAL LOW (ref 26.0–34.0)
MCHC: 30.4 g/dL (ref 30.0–36.0)
MCV: 72.8 fL — ABNORMAL LOW (ref 80.0–100.0)
Platelets: 172 10*3/uL (ref 150–400)
RBC: 4.52 MIL/uL (ref 4.22–5.81)
RDW: 18.7 % — ABNORMAL HIGH (ref 11.5–15.5)
WBC: 10.1 10*3/uL (ref 4.0–10.5)
nRBC: 0 % (ref 0.0–0.2)

## 2021-07-13 LAB — GLUCOSE, CAPILLARY
Glucose-Capillary: 140 mg/dL — ABNORMAL HIGH (ref 70–99)
Glucose-Capillary: 209 mg/dL — ABNORMAL HIGH (ref 70–99)
Glucose-Capillary: 145 mg/dL — ABNORMAL HIGH (ref 70–99)
Glucose-Capillary: 172 mg/dL — ABNORMAL HIGH (ref 70–99)

## 2021-07-13 LAB — MAGNESIUM: Magnesium: 1.9 mg/dL (ref 1.7–2.4)

## 2021-07-13 MED ORDER — POTASSIUM CHLORIDE 10 MEQ/100ML IV SOLN
10.0000 meq | INTRAVENOUS | Status: AC
  Administered 2021-07-13 (×4): 10 meq via INTRAVENOUS
  Filled 2021-07-13 (×4): qty 100

## 2021-07-13 MED ORDER — POTASSIUM CHLORIDE 10 MEQ/50ML IV SOLN
10.0000 meq | INTRAVENOUS | Status: AC
  Administered 2021-07-13 (×6): 10 meq via INTRAVENOUS
  Filled 2021-07-13 (×6): qty 50

## 2021-07-13 MED ORDER — POLYETHYLENE GLYCOL 3350 17 G PO PACK
17.0000 g | PACK | Freq: Every day | ORAL | Status: DC
  Administered 2021-07-13: 17 g via ORAL
  Filled 2021-07-13 (×2): qty 1

## 2021-07-13 MED ORDER — FERUMOXYTOL INJECTION 510 MG/17 ML
510.0000 mg | Freq: Once | INTRAVENOUS | Status: AC
  Administered 2021-07-13: 510 mg via INTRAVENOUS
  Filled 2021-07-13: qty 17

## 2021-07-13 MED ORDER — SENNOSIDES-DOCUSATE SODIUM 8.6-50 MG PO TABS
2.0000 | ORAL_TABLET | Freq: Two times a day (BID) | ORAL | Status: DC
  Administered 2021-07-13 – 2021-07-16 (×2): 2 via ORAL
  Filled 2021-07-13 (×5): qty 2

## 2021-07-13 MED ORDER — POTASSIUM CHLORIDE CRYS ER 20 MEQ PO TBCR: 40.0000 meq | EXTENDED_RELEASE_TABLET | Freq: Three times a day (TID) | ORAL | Status: DC

## 2021-07-13 MED ORDER — POTASSIUM CHLORIDE CRYS ER 20 MEQ PO TBCR
40.0000 meq | EXTENDED_RELEASE_TABLET | Freq: Once | ORAL | Status: AC
  Administered 2021-07-13: 40 meq via ORAL
  Filled 2021-07-13: qty 2

## 2021-07-13 MED ORDER — POTASSIUM CHLORIDE 10 MEQ/50ML IV SOLN
10.0000 meq | INTRAVENOUS | Status: DC
  Filled 2021-07-13 (×6): qty 50

## 2021-07-13 MED ORDER — MAGNESIUM SULFATE 2 GM/50ML IV SOLN
2.0000 g | Freq: Once | INTRAVENOUS | Status: AC
  Administered 2021-07-13: 2 g via INTRAVENOUS
  Filled 2021-07-13: qty 50

## 2021-07-13 MED ORDER — LACTULOSE 10 GM/15ML PO SOLN
20.0000 g | Freq: Once | ORAL | Status: AC
  Administered 2021-07-13: 20 g via ORAL
  Filled 2021-07-13: qty 30

## 2021-07-13 MED ORDER — POTASSIUM CHLORIDE CRYS ER 20 MEQ PO TBCR
40.0000 meq | EXTENDED_RELEASE_TABLET | Freq: Three times a day (TID) | ORAL | Status: DC
  Administered 2021-07-13 (×3): 40 meq via ORAL
  Filled 2021-07-13 (×3): qty 2

## 2021-07-13 NOTE — Progress Notes (Signed)
? ?Progress Note ? ?Patient Name: Charles Gray ?Date of Encounter: 07/13/2021 ? ?[facility] Cardiologist: Berniece Salines, DO  ? ?Subjective  ? ?No CP; mild dyspnea; ambulated yesterday ? ?Inpatient Medications  ?  ?Scheduled Meds: ? apixaban  10 mg Oral BID  ? Followed by  ? [START ON 07/17/2021] apixaban  5 mg Oral BID  ? buprenorphine-naloxone  1 tablet Sublingual Daily  ? Chlorhexidine Gluconate Cloth  6 each Topical Daily  ? gabapentin  800 mg Oral TID  ? insulin aspart  0-15 Units Subcutaneous TID WC  ? insulin aspart  0-5 Units Subcutaneous QHS  ? lactulose  10 g Oral TID  ? lactulose  20 g Oral Once  ? metoprolol succinate  25 mg Oral QHS  ? pantoprazole  40 mg Oral BID AC  ? polyethylene glycol  17 g Oral Daily  ? potassium chloride  40 mEq Oral TID  ? potassium chloride  40 mEq Oral TID  ? pravastatin  20 mg Oral QHS  ? senna-docusate  2 tablet Oral BID  ? sertraline  50 mg Oral Daily  ? sodium chloride flush  10-40 mL Intracatheter Q12H  ? spironolactone  25 mg Oral Daily  ? tamsulosin  0.4 mg Oral Daily  ? torsemide  100 mg Oral BID  ? ?Continuous Infusions: ? sodium chloride 10 mL/hr at 07/12/21 0734  ? ?PRN Meds: ?sodium chloride, acetaminophen **OR** acetaminophen, ipratropium-albuterol, ondansetron **OR** ondansetron (ZOFRAN) IV, polyethylene glycol, sodium chloride, sodium chloride flush, traMADol, zolpidem  ? ?Vital Signs  ?  ?Vitals:  ? 07/12/21 2351 07/13/21 9798 07/13/21 0447 07/13/21 0912  ?BP: (!) 93/55 129/68  (!) 104/53  ?Pulse:  78  69  ?Resp: 16 18  18   ?Temp: 98.8 ?F (37.1 ?C) 98.7 ?F (37.1 ?C)  97.8 ?F (36.6 ?C)  ?TempSrc: Oral Oral  Oral  ?SpO2: 97% 98%  96%  ?Weight:   (!) 170.5 kg   ?Height:      ? ? ?Intake/Output Summary (Last 24 hours) at 07/13/2021 0933 ?Last data filed at 07/13/2021 0913 ?Gross per 24 hour  ?Intake 650 ml  ?Output 3750 ml  ?Net -3100 ml  ? ? ? ?  07/13/2021  ?  4:47 AM 07/12/2021  ?  4:49 AM 07/11/2021  ?  4:41 AM  ?Last 3 Weights  ?Weight (lbs) 375 lb 14.2 oz 381 lb 8 oz  391 lb 4.8 oz  ?Weight (kg) 170.5 kg 173.047 kg 177.493 kg  ?   ? ?Telemetry  ?  ?Sinus with pacs and PAT- Personally Reviewed ? ?Physical Exam  ? ?GEN: Obese NAD ?Neck: supple ?Cardiac: RRR ?Respiratory: CTA ?GI: Soft, NT/ND ?MS: Wrapped; 1+ edema ?Neuro:  Grossly intact ?Psych: Normal affect  ? ?Labs  ?  ?High Sensitivity Troponin:   ?Recent Labs  ?Lab 07/07/21 ?2236 07/08/21 ?0003  ?TROPONINIHS 7 6  ? ?   ?Chemistry ?Recent Labs  ?Lab 07/07/21 ?1616 07/07/21 ?1827 07/09/21 ?0451 07/09/21 ?1355 07/11/21 ?0431 07/11/21 ?1710 07/12/21 ?9211 07/12/21 ?1536 07/13/21 ?0124  ?NA 138  --  137   < > 134*   < > 136 135 133*  ?K 3.4*  --  2.9*   < > 2.7*   < > 2.6* 2.3* 2.5*  ?CL 98  --  96*   < > 94*   < > 91* 88* 85*  ?CO2 30  --  33*   < > 31   < > 35* 33* 34*  ?GLUCOSE 120*  --  108*   < > 133*   < > 122* 100* 120*  ?BUN 10  --  12   < > 9   < > 11 13 14   ?CREATININE 1.10  --  1.06   < > 1.10   < > 1.18 1.23 1.30*  ?CALCIUM 8.8*  --  8.6*   < > 9.1   < > 9.5 9.5 9.2  ?MG  --    < > 2.2   < > 2.2  --  2.0  --  1.9  ?PROT 7.5  --  6.6  --   --   --   --   --   --   ?ALBUMIN 3.2*  --  2.8*  --   --   --   --   --   --   ?AST 34  --  33  --   --   --   --   --   --   ?ALT 18  --  16  --   --   --   --   --   --   ?ALKPHOS 79  --  68  --   --   --   --   --   --   ?BILITOT 1.1  --  1.1  --   --   --   --   --   --   ?GFRNONAA >60  --  >60   < > >60   < > >60 >60 >60  ?ANIONGAP 10  --  8   < > 9   < > 10 14 14   ? < > = values in this interval not displayed.  ? ?  ? ?Hematology ?Recent Labs  ?Lab 07/11/21 ?0431 07/12/21 ?5631 07/13/21 ?0124  ?WBC 7.4 9.7 10.1  ?RBC 4.20* 4.59 4.52  ?HGB 9.1* 10.1* 10.0*  ?HCT 31.7* 33.5* 32.9*  ?MCV 75.5* 73.0* 72.8*  ?MCH 21.7* 22.0* 22.1*  ?MCHC 28.7* 30.1 30.4  ?RDW 17.5* 18.2* 18.7*  ?PLT 145* 155 172  ? ? ? ?BNP ?Recent Labs  ?Lab 07/07/21 ?2236  ?BNP 79.9  ? ?  ?DDimer  ?Recent Labs  ?Lab 07/09/21 ?0451  ?DDIMER 0.50  ? ?  ? ?Patient Profile  ? ?66 y/o male w/ chronic diastolic heart  failure w/ suspected RV failure, OSA, obesity, h/o PE/DVT, PAF, HTN and h/o poor compliance, admitted w/ a/c heart failure w/ fluid overload.  Echocardiogram this admission shows ejection fraction 60 to 65%, moderate to severe left ventricular enlargement, mild left ventricular hypertrophy, mild left atrial enlargement, trace mitral regurgitation. ? ?Assessment & Plan  ? ?1 acute on chronic diastolic congestive heart failure-I/O-3110; wt 170.5 kg.  Plan to continue Demadex and spironolactone at present dose.  Continue to follow renal function. ? ?2 hypokalemia- K 2.5; continue vigorous supplementation.  ? ?3 PE/DVT-continue apixaban. ? ?4 obstructive sleep apnea-continue CPAP. ? ?5 coronary artery disease-continue statin. ? ?6 hypertension-blood pressure controlled.  Continue present medications. ? ?7 history of paroxysmal atrial fibrillation-Continue apixaban and beta-blocker. ? ? ?Plan to discharge when potassium stable. ? ?For questions or updates, please contact [place] ?Please consult [email or web address] for contact info under  ? ?  ?   ?Signed, ?Kirk Ruths, MD  ?07/13/2021, 9:33 AM   ? ?

## 2021-07-13 NOTE — Progress Notes (Signed)
?PROGRESS NOTE ? ?Charles Gray  ?DOB: 12-Jun-1955  ?PCP: Enid Skeens., MD ?XNA:[id number]  ?DOA: 07/07/2021 ? LOS: 0 days  ?Hospital Day: 7 ? ?Brief narrative: ?Charles Gray is a 66 y.o. male with PMH significant for morbid obesity, OSA, HTN, HLD, chronic diastolic CHF, history of DVT/PE, paroxysmal A-fib on Eliquis, COPD, liver cirrhosis, chronic pain on opioids, chronic constipation who presented to ED on 3/27 with complaints of progressively worsening bilateral lower extremity edema and erythema.  Associated with increasing dyspnea for the past few days.  He was treated outpatient for suspected bilateral lower extremity cellulitis without improvement.  Work-up revealed CTA chest with suspected bilateral lower lobe segmental and subs segmental pulmonary emboli.  Bilateral lower extremity ultrasound negative for DVT.  Patient reports compliance with DOAC, Eliquis.  Grossly volume overloaded on physical exam.  Admitted by hospitalist service, TRH.  Heart failure team consulted. ?Ongoing diuresing. ? ?Hospital course complicated by refractory hypokalemia despite IV and oral replacement. ?  ?Subjective: ?Patient was seen and examined at his bedside.  Endorses bilateral lower extremity pain with Unna boots in place.  Feels constipated, had a small bowel movement today.  Patient denies any chest pain, states he has had no chest pain since admission. ? ?Principal Problem: ?  Acute pulmonary embolism ([place]) ?Active Problems: ?  Chronic diastolic HF (heart failure) ([place]) ?  Paroxysmal atrial fibrillation ([facility]) ?  COPD with acute exacerbation ([place]) ?  Lymphedema ?  Essential hypertension ?  GERD without esophagitis ?  ? ?Assessment and Plan: ?Acute on Chronic diastolic CHF ?Essential hypertension ?Presented with dyspnea with minimal exertion, severe volume overload on exam. ?Ongoing diuresing. ?Heart failure team assisting with the management. ?Net I&O -14.9 L ?-3/29, echocardiogram with EF 60 to 65%, moderate to  severely dilated left ventricular cavity, no wall motion abnormality. ?-PTA, patient was on Toprol 25 mg daily, torsemide 100 mg twice daily, metolazone 2.5 mg daily MWF, eplerenone 25 mg daily ?Continue to replace electrolytes as indicated. ?Management per heart failure team recommendations. ?Continue Unna boots ?Continue strict I's and O's and daily weight. ? ?Recent Labs  ?Lab 07/07/21 ?2236 07/09/21 ?0451 07/09/21 ?1355 07/10/21 ?5427 07/11/21 ?0431 07/11/21 ?1710 07/12/21 ?0623 07/12/21 ?1536 07/13/21 ?0124  ?BNP 79.9  --   --   --   --   --   --   --   --   ?BUN  --  12   < > 10 9 10 11 13 14   ?CREATININE  --  1.06   < > 1.12 1.10 1.05 1.18 1.23 1.30*  ?K  --  2.9*   < > 3.9 2.7* 3.2* 2.6* 2.3* 2.5*  ?MG  --  2.2  --  2.2 2.2  --  2.0  --  1.9  ? < > = values in this interval not displayed.  ? ?Refractory hypokalemia, persistent. ?Serum potassium 2.5 this morning. ?KCl repleted intravenously and orally. ?Continue to replete as indicated. ?Pharmacy consult to assist with repletion of potassium, electrolytes. ? ?Suspected bilateral lower lobe segmental and subsegmental pulmonary emboli.  ?No evidence of right heart strain.  ?Short-term follow-up is recommended for confirmation. ?Not hypoxic, with oxygen saturation 96% on ambient air. ?Continue Eliquis, will need to closely follow-up with cardiology for repeat CT scan. ? ?History of DVT/PE on Eliquis ?-Patient has history of DVT/PE and reports compliance to Eliquis at home.   ?-CT angio of chest was suboptimal and suspected bilateral lower lobe segmental and subsegmental PE.  ?-Ultrasound duplex of  lower extremities negative for DVT. Unable to do VQ scan because of his weight. ?-On admission, patient was started on heparin drip. ?-Dr. Pietro Cassis discussed with cardiologist Dr. [name].  Within, patient's respiratory distress is primarily due to volume overload and not PE.  Decision was made to continue Eliquis at this time. ?-Switched from heparin drip to Eliquis on  3/30. ?No chest pain since admission. ? ?Paroxysmal atrial fibrillation ?-Currently rate controlled on Toprol-XL. ?Continue Eliquis for CVA prevention ? ?Hyperlipidemia ?-Pravastatin 20 mg daily ? ?COPD  ?-Not in acute exacerbation  ?Continue bronchodilators.   ?Not hypoxic, O2 saturation 96% on ambient air. ? ?GERD with history of gastritis ?EGD done on 04/08/2021 showed: ?Portal hypertensive gastropathy. ?- Two gastric polyps. Biopsied.  Biopsy revealed patchy acute gastritis.  No malignancy. ?-Continue PPI twice daily ? ?Chronic pain syndrome ?-PTA, patient was on Suboxone 1 tab sublingual twice daily, Neurontin 800 mg 3 times daily ?Continue home regimen along with bowel regimen. ? ?Chronic constipation ?-PTA, patient was on lactulose 10 g 3 times daily, MiraLAX daily ?Continue laxatives ? ?Anxiety/depression ?-Continue Zoloft 50 mg daily ? ?BPH ?- continue 0.4 mg daily ? ?Chronic lower extremity edema/chronic venous stasis ?Continue Unna boots ?Continue to elevate lower extremities as tolerated. ? ?Physical debility/ambulatory dysfunction ?PT OT assessment recommending home health PT OT ?Continue to mobilize as tolerated ?Continue fall precautions. ? ? ?Goals of care ?  Code Status: Full Code  ? ?Mobility: Encourage ambulation. PT eval ordered. ? ?Nutritional status:  ?Body mass index is 39.3 kg/m?.  ?  ?  ? ? ? ? ?Diet:  ?Diet Order   ? ?       ?  Diet Heart Room service appropriate? Yes; Fluid consistency: Thin  Diet effective now       ?  ? ?  ?  ? ?  ? ? ?DVT prophylaxis: Eliquis ? ?apixaban (ELIQUIS) tablet 10 mg  ?apixaban (ELIQUIS) tablet 5 mg Antimicrobials: None ?Fluid: None ?Consultants: Cardiology  ?Family Communication: Family not at bedside ? ?Status is: Observation ? ?Continue in-hospital care because: Needs volume management ?Level of care: Telemetry Medical  ? ?Dispo: The patient is from: Home ?             Anticipated d/c is to: Pending clinical course and PT eval ?             Patient  currently is not medically stable to d/c. ?  Difficult to place patient No ? ? ? ? ?Infusions:  ? sodium chloride 10 mL/hr at 07/12/21 0734  ? ? ?Scheduled Meds: ? apixaban  10 mg Oral BID  ? Followed by  ? [START ON 07/17/2021] apixaban  5 mg Oral BID  ? buprenorphine-naloxone  1 tablet Sublingual Daily  ? Chlorhexidine Gluconate Cloth  6 each Topical Daily  ? gabapentin  800 mg Oral TID  ? insulin aspart  0-15 Units Subcutaneous TID WC  ? insulin aspart  0-5 Units Subcutaneous QHS  ? lactulose  10 g Oral TID  ? lactulose  20 g Oral Once  ? metoprolol succinate  25 mg Oral QHS  ? pantoprazole  40 mg Oral BID AC  ? polyethylene glycol  17 g Oral Daily  ? potassium chloride  40 mEq Oral TID  ? potassium chloride  40 mEq Oral TID  ? pravastatin  20 mg Oral QHS  ? senna-docusate  2 tablet Oral BID  ? sertraline  50 mg Oral Daily  ? sodium chloride flush  10-40 mL Intracatheter Q12H  ? spironolactone  25 mg Oral Daily  ? tamsulosin  0.4 mg Oral Daily  ? torsemide  100 mg Oral BID  ? ? ?PRN meds: ?sodium chloride, acetaminophen **OR** acetaminophen, ipratropium-albuterol, ondansetron **OR** ondansetron (ZOFRAN) IV, polyethylene glycol, sodium chloride, sodium chloride flush, traMADol, zolpidem  ? ?Antimicrobials: ?Anti-infectives (From admission, onward)  ? ? None  ? ?  ? ? ?Objective: ?Vitals:  ? 07/13/21 0438 07/13/21 0912  ?BP: 129/68 (!) 104/53  ?Pulse: 78 69  ?Resp: 18 18  ?Temp: 98.7 ?F (37.1 ?C) 97.8 ?F (36.6 ?C)  ?SpO2: 98% 96%  ? ? ?Intake/Output Summary (Last 24 hours) at 07/13/2021 0935 ?Last data filed at 07/13/2021 0913 ?Gross per 24 hour  ?Intake 650 ml  ?Output 3750 ml  ?Net -3100 ml  ? ?Filed Weights  ? 07/11/21 0441 07/12/21 0449 07/13/21 0447  ?Weight: (!) 177.5 kg (!) 173 kg (!) 170.5 kg  ? ?Weight change: -2.547 kg ?Body mass index is 39.3 kg/m?.  ? ?Physical Exam:  ?General exam: Severe morbidly obese in no acute distress.  He is alert and oriented x3. ?HEENT: Atraumatic normocephalic. ?Lungs: Clear to  auscultation no wheezes or rales. ?CVS: Regular rate and rhythm no rubs gallops.   ?GI/Abd obese nontender bowel sounds present. ?CNS: Alert and awake.  Oriented x3. ?Psychiatry: Mood is appropriate for condition and

## 2021-07-13 NOTE — Progress Notes (Signed)
Mobility Specialist Progress Note  ? ? 07/13/21 1047  ?Mobility  ?Activity Ambulated with assistance in hallway  ?Level of Assistance Standby assist, set-up cues, supervision of patient - no hands on  ?Assistive Device Other (Comment) ?Ethelene Hal)  ?Distance Ambulated (ft) 220 ft  ?Activity Response Tolerated fair  ?$Mobility charge 1 Mobility  ? ?Pre-Mobility: 89 HR ? ?Pt received sitting EOB and agreeable. C/o being worried about his R knee blowing out. Took x2 short standing rest breaks. Returned to sitting EOB with call bell in reach.  ? ?Charles Gray ?Mobility Specialist  ?  ?

## 2021-07-13 NOTE — Progress Notes (Signed)
Occupational Therapy Treatment ?Patient Details ?Name: Charles Gray ?MRN: [id number] ?DOB: 1956/04/07 ?Today's Date: 07/13/2021 ? ? ?History of present illness Pt is a 66 y.o. M who presents 07/07/2021 with heat failure with fluid overload.  CT scan this admit limited because of suboptimal opacification and artifact, however is suggestive of bilateral lower lobe segmental and subsegmental pulm emboli but no evidence of Rt heart strain. Follow up V/Q scan has been ordered. LE veous dopplers negative for DVT. Significant PMH: chronic diastolic heart failure, OSA, obesity, history of PE/DVT, PAF, HTN. ?  ?OT comments ? Patient received sitting on EOB and had complaints of nausea but was willing to address LB dressing training with AE. Patient provided toilet wand and education on use and indicated understanding. Patient was instructed on LB dressing with reacher, dressing stick, and wide sock aide.  Patient was provided demonstration and he was able to returned demonstration with min assist to complete. Patient performed toilet transfer with min guard assist for safety.  Patient informed on how to purchase AE.  Acute OT to continue to follow.   ? ?Recommendations for follow up therapy are one component of a multi-disciplinary discharge planning process, led by the attending physician.  Recommendations may be updated based on patient status, additional functional criteria and insurance authorization. ?   ?Follow Up Recommendations ? Home health OT ([facility] services)  ?  ?Assistance Recommended at Discharge Intermittent Supervision/Assistance  ?Patient can return home with the following ? A little help with walking and/or transfers;A little help with bathing/dressing/bathroom;Assistance with cooking/housework;Assist for transportation;Help with stairs or ramp for entrance ?  ?Equipment Recommendations ? Other (comment) (may benefit from AE)  ?  ?Recommendations for Other Services   ? ?  ?Precautions / Restrictions  Precautions ?Precautions: Fall ?Restrictions ?Weight Bearing Restrictions: No  ? ? ?  ? ?Mobility Bed Mobility ?  ?  ?  ?  ?  ?  ?  ?General bed mobility comments: Sitting EOB upon arrival ?  ? ?Transfers ?Overall transfer level: Modified independent ?  ?  ?  ?  ?  ?  ?  ?  ?General transfer comment: min guard for safety ?  ?  ?Balance Overall balance assessment: Needs assistance ?Sitting-balance support: Feet supported, No upper extremity supported ?Sitting balance-Leahy Scale: Good ?Sitting balance - Comments: able to maintain balance while performing dressing tasks ?  ?Standing balance support: Single extremity supported, During functional activity ?Standing balance-Leahy Scale: Poor ?Standing balance comment: reliant on at least single UE support ?  ?  ?  ?  ?  ?  ?  ?  ?  ?  ?  ?   ? ?ADL either performed or assessed with clinical judgement  ? ?ADL Overall ADL's : Needs assistance/impaired ?  ?  ?Grooming: Wash/dry hands;Standing;Supervision/safety ?Grooming Details (indicate cue type and reason): performed hand hygiene ?  ?  ?  ?  ?  ?  ?Lower Body Dressing: Minimal assistance;Sit to/from stand ?Lower Body Dressing Details (indicate cue type and reason): AE training for LB dressing with education on reacher, dressing stick and wide sock aide ?Toilet Transfer: Min guard ?Toilet Transfer Details (indicate cue type and reason): verbal cues for safety ?Toileting- Clothing Manipulation and Hygiene: Supervision/safety;Sitting/lateral lean ?  ?  ?  ?  ?General ADL Comments: AE training with reacher, sock aide, dressing stick, and toilet wand ?  ? ?Extremity/Trunk Assessment   ?  ?  ?  ?  ?  ? ?Vision   ?  ?  ?  Perception   ?  ?Praxis   ?  ? ?Cognition Arousal/Alertness: Awake/alert ?Behavior During Therapy: Flat affect ?Overall Cognitive Status: Within Functional Limits for tasks assessed ?  ?  ?  ?  ?  ?  ?  ?  ?  ?  ?  ?  ?  ?  ?  ?  ?General Comments: cues for safety ?  ?  ?   ?Exercises   ? ?  ?Shoulder  Instructions   ? ? ?  ?General Comments    ? ? ?Pertinent Vitals/ Pain       Pain Assessment ?Pain Assessment: Faces ?Pain Score: 2  ?Pain Location: right forearm/hand ?Pain Descriptors / Indicators: Grimacing ?Pain Intervention(s): Limited activity within patient's tolerance, Monitored during session ? ?Home Living   ?  ?  ?  ?  ?  ?  ?  ?  ?  ?  ?  ?  ?  ?  ?  ?  ?  ?  ? ?  ?Prior Functioning/Environment    ?  ?  ?  ?   ? ?Frequency ? Min 2X/week  ? ? ? ? ?  ?Progress Toward Goals ? ?OT Goals(current goals can now be found in the care plan section) ? Progress towards OT goals: Progressing toward goals ? ?Acute Rehab OT Goals ?Patient Stated Goal: get better ?OT Goal Formulation: With patient ?Time For Goal Achievement: 07/24/21 ?Potential to Achieve Goals: Good ?ADL Goals ?Pt Will Perform Lower Body Bathing: with modified independence;with adaptive equipment;sit to/from stand ?Pt Will Perform Lower Body Dressing: with modified independence;with adaptive equipment;sit to/from stand ?Pt Will Transfer to Toilet: with modified independence;ambulating ?Pt Will Perform Toileting - Clothing Manipulation and hygiene: with modified independence;sit to/from stand;sitting/lateral leans;with adaptive equipment ?Additional ADL Goal #1: PT will identify 3 strateiges to help manage CHF  ?Plan Discharge plan remains appropriate   ? ?Co-evaluation ? ? ?   ?  ?  ?  ?  ? ?  ?AM-PAC OT "6 Clicks" Daily Activity     ?Outcome Measure ? ? Help from another person eating meals?: None ?Help from another person taking care of personal grooming?: A Little ?Help from another person toileting, which includes using toliet, bedpan, or urinal?: A Little ?Help from another person bathing (including washing, rinsing, drying)?: A Lot ?Help from another person to put on and taking off regular upper body clothing?: A Little ?Help from another person to put on and taking off regular lower body clothing?: A Little ?6 Click Score: 18 ? ?  ?End of  Session   ? ?OT Visit Diagnosis: Unsteadiness on feet (R26.81);Other abnormalities of gait and mobility (R26.89);Muscle weakness (generalized) (M62.81);Pain ?  ?Activity Tolerance Other (comment) (patient stating he felt nauseous) ?  ?Patient Left in bed;with call bell/phone within reach;with bed alarm set ?  ?Nurse Communication Mobility status ?  ? ?   ? ?Time: 8177-1165 ?OT Time Calculation (min): 33 min ? ?Charges: OT General Charges ?$OT Visit: 1 Visit ?OT Treatments ?$Self Care/Home Management : 23-37 mins ? ?Lodema Hong, OTA ?Acute Rehabilitation Services  ?Pager [phone] ?Office [phone] ? ? ?Giliana Vantil Alexis Goodell ?07/13/2021, 1:30 PM ?

## 2021-07-14 DIAGNOSIS — K7460 Unspecified cirrhosis of liver: Secondary | ICD-10-CM | POA: Diagnosis not present

## 2021-07-14 DIAGNOSIS — I5033 Acute on chronic diastolic (congestive) heart failure: Secondary | ICD-10-CM

## 2021-07-14 DIAGNOSIS — I890 Lymphedema, not elsewhere classified: Secondary | ICD-10-CM | POA: Diagnosis not present

## 2021-07-14 DIAGNOSIS — I5032 Chronic diastolic (congestive) heart failure: Secondary | ICD-10-CM | POA: Diagnosis not present

## 2021-07-14 DIAGNOSIS — I2699 Other pulmonary embolism without acute cor pulmonale: Secondary | ICD-10-CM | POA: Diagnosis not present

## 2021-07-14 DIAGNOSIS — D509 Iron deficiency anemia, unspecified: Secondary | ICD-10-CM | POA: Diagnosis not present

## 2021-07-14 LAB — GLUCOSE, CAPILLARY
Glucose-Capillary: 105 mg/dL — ABNORMAL HIGH (ref 70–99)
Glucose-Capillary: 129 mg/dL — ABNORMAL HIGH (ref 70–99)
Glucose-Capillary: 122 mg/dL — ABNORMAL HIGH (ref 70–99)
Glucose-Capillary: 149 mg/dL — ABNORMAL HIGH (ref 70–99)

## 2021-07-14 LAB — MAGNESIUM: Magnesium: 2.3 mg/dL (ref 1.7–2.4)

## 2021-07-14 LAB — BASIC METABOLIC PANEL
Anion gap: 13 (ref 5–15)
BUN: 13 mg/dL (ref 8–23)
CO2: 33 mmol/L — ABNORMAL HIGH (ref 22–32)
Calcium: 8.9 mg/dL (ref 8.9–10.3)
Chloride: 86 mmol/L — ABNORMAL LOW (ref 98–111)
Creatinine, Ser: 1.15 mg/dL (ref 0.61–1.24)
GFR, Estimated: >60 mL/min (ref >60)
Glucose, Bld: 135 mg/dL — ABNORMAL HIGH (ref 70–99)
Potassium: 3.3 mmol/L — ABNORMAL LOW (ref 3.5–5.1)
Sodium: 132 mmol/L — ABNORMAL LOW (ref 135–145)

## 2021-07-14 LAB — CBC
HCT: 35 % — ABNORMAL LOW (ref 39.0–52.0)
Hemoglobin: 10.5 g/dL — ABNORMAL LOW (ref 13.0–17.0)
MCH: 22.1 pg — ABNORMAL LOW (ref 26.0–34.0)
MCHC: 30 g/dL (ref 30.0–36.0)
MCV: 73.7 fL — ABNORMAL LOW (ref 80.0–100.0)
Platelets: 173 10*3/uL (ref 150–400)
RBC: 4.75 MIL/uL (ref 4.22–5.81)
RDW: 19.4 % — ABNORMAL HIGH (ref 11.5–15.5)
WBC: 11.8 10*3/uL — ABNORMAL HIGH (ref 4.0–10.5)
nRBC: 2.1 % — ABNORMAL HIGH (ref 0.0–0.2)

## 2021-07-14 LAB — POTASSIUM: Potassium: 2.3 mmol/L — CL (ref 3.5–5.1)

## 2021-07-14 LAB — PROTHROMBIN GENE MUTATION

## 2021-07-14 LAB — FACTOR 5 LEIDEN

## 2021-07-14 MED ORDER — POTASSIUM CHLORIDE 10 MEQ/50ML IV SOLN
10.0000 meq | INTRAVENOUS | Status: DC
  Filled 2021-07-14 (×6): qty 50

## 2021-07-14 MED ORDER — POTASSIUM CHLORIDE 10 MEQ/50ML IV SOLN
10.0000 meq | INTRAVENOUS | Status: AC
  Administered 2021-07-14 (×4): 10 meq via INTRAVENOUS
  Filled 2021-07-14 (×4): qty 50

## 2021-07-14 MED ORDER — POTASSIUM CHLORIDE 10 MEQ/50ML IV SOLN
10.0000 meq | INTRAVENOUS | Status: AC
  Administered 2021-07-14 (×3): 10 meq via INTRAVENOUS
  Filled 2021-07-14 (×3): qty 50

## 2021-07-14 MED ORDER — POTASSIUM CHLORIDE CRYS ER 20 MEQ PO TBCR
40.0000 meq | EXTENDED_RELEASE_TABLET | Freq: Four times a day (QID) | ORAL | Status: DC
  Administered 2021-07-14: 40 meq via ORAL
  Filled 2021-07-14: qty 2

## 2021-07-14 MED ORDER — POTASSIUM CHLORIDE CRYS ER 20 MEQ PO TBCR
100.0000 meq | EXTENDED_RELEASE_TABLET | Freq: Two times a day (BID) | ORAL | Status: DC
  Administered 2021-07-14: 100 meq via ORAL
  Filled 2021-07-14: qty 5

## 2021-07-14 MED ORDER — POTASSIUM CHLORIDE CRYS ER 20 MEQ PO TBCR
60.0000 meq | EXTENDED_RELEASE_TABLET | Freq: Once | ORAL | Status: AC
  Administered 2021-07-14: 60 meq via ORAL
  Filled 2021-07-14: qty 3

## 2021-07-14 NOTE — Assessment & Plan Note (Addendum)
--  CT scan noted  nodular contour of liver, compatible with underlying cirrhosis. In setting of known RV dysfunction. LFTs ok. EGD 12/22 showed Portal hypertensive gastropathy. No varices. ? ?Lactulose has been increased to 30 ml tid, to target 2 to 3 bowel movements per day.  ?

## 2021-07-14 NOTE — Progress Notes (Addendum)
Patient ID: Charles Gray, male   DOB: 05/08/1955, 66 y.o.   MRN: [id number] ?  ? ? Advanced Heart Failure Rounding Note ? ?PCP-Cardiologist: Berniece Salines, DO  ?AHFC: Dr. [name]  ? ?Subjective:   ? ? ?Scr stable at 1.15, K up to 3.3 at 12 am. K aggressively supplemented. CVP not set up. ? ?Weight stable. ? ?Ambulated in halls yesterday. Feels okay. No dyspnea at rest. Still has some LE edema. UNNA boots off after they got wet. ? ? ?Objective:   ?Weight Range: ?(!) 170.4 kg ?Body mass index is 39.28 kg/m?.  ? ?Vital Signs:   ?Temp:  [97.7 ?F (36.5 ?C)-98.4 ?F (36.9 ?C)] 97.7 ?F (36.5 ?C) (04/03 3734) ?Pulse Rate:  [65-90] 65 (04/03 0733) ?Resp:  [17-18] 17 (04/03 0733) ?BP: (104-124)/(53-65) 117/65 (04/03 2876) ?SpO2:  [95 %-98 %] 96 % (04/03 0733) ?Weight:  [170.4 kg] 170.4 kg (04/03 0328) ?Last BM Date : 07/13/21 ? ?Weight change: ?Filed Weights  ? 07/12/21 0449 07/13/21 0447 07/14/21 0328  ?Weight: (!) 173 kg (!) 170.5 kg (!) 170.4 kg  ? ? ?Intake/Output:  ? ?Intake/Output Summary (Last 24 hours) at 07/14/2021 0906 ?Last data filed at 07/14/2021 0430 ?Gross per 24 hour  ?Intake 790 ml  ?Output 1800 ml  ?Net -1010 ml  ?  ? ? ?Physical Exam  ?CVP not set up ?General:  No distress. Sitting up on side of bed. ?HEENT: normal ?Neck: supple. no JVD. Carotids 2+ bilat; no bruits. No lymphadenopathy or thryomegaly appreciated. ?Cor: PMI nondisplaced. Regular rate & rhythm. No rubs, gallops or murmurs. ?Lungs: clear ?Abdomen: soft, nontender, nondistended, obese ?Extremities: no cyanosis, clubbing, rash, 1+ edema to knees with chronic venous stasis changes ?Neuro: alert & orientedx3, cranial nerves grossly intact. moves all 4 extremities w/o difficulty. Affect pleasant ? ? ? ?Telemetry  ? ?NSR 70s-80s ? ?EKG  ?  ?No new EKG to review  ? ?Labs  ?  ?CBC ?Recent Labs  ?  07/13/21 ?0124 07/14/21 ?0000  ?WBC 10.1 11.8*  ?HGB 10.0* 10.5*  ?HCT 32.9* 35.0*  ?MCV 72.8* 73.7*  ?PLT 172 173  ? ?Basic Metabolic Panel ?Recent Labs  ?   07/13/21 ?0124 07/13/21 ?1235 07/14/21 ?0000  ?NA 133* 133* 132*  ?K 2.5* 2.4* 3.3*  ?CL 85* 85* 86*  ?CO2 34* 33* 33*  ?GLUCOSE 120* 131* 135*  ?BUN 14 15 13   ?CREATININE 1.30* 1.18 1.15  ?CALCIUM 9.2 9.4 8.9  ?MG 1.9  --  2.3  ? ?Liver Function Tests ?No results for input(s): AST, ALT, ALKPHOS, BILITOT, PROT, ALBUMIN in the last 72 hours. ? ?No results for input(s): LIPASE, AMYLASE in the last 72 hours. ?Cardiac Enzymes ?No results for input(s): CKTOTAL, CKMB, CKMBINDEX, TROPONINI in the last 72 hours. ? ?BNP: ?BNP (last 3 results) ?Recent Labs  ?  12/21/20 ?0052 04/06/21 ?1454 07/07/21 ?2236  ?BNP 27.2 82.2 79.9  ? ? ?ProBNP (last 3 results) ?No results for input(s): PROBNP in the last 8760 hours. ? ? ?D-Dimer ?No results for input(s): DDIMER in the last 72 hours. ? ?Hemoglobin A1C ?No results for input(s): HGBA1C in the last 72 hours. ? ?Fasting Lipid Panel ?No results for input(s): CHOL, HDL, LDLCALC, TRIG, CHOLHDL, LDLDIRECT in the last 72 hours. ?Thyroid Function Tests ?No results for input(s): TSH, T4TOTAL, T3FREE, THYROIDAB in the last 72 hours. ? ?Invalid input(s): FREET3 ? ?Other results: ? ? ?Imaging  ? ? ?No results found. ? ? ?Medications:   ? ? ?Scheduled Medications: ? apixaban  10 mg Oral BID  ? Followed by  ? [START ON 07/17/2021] apixaban  5 mg Oral BID  ? buprenorphine-naloxone  1 tablet Sublingual Daily  ? Chlorhexidine Gluconate Cloth  6 each Topical Daily  ? gabapentin  800 mg Oral TID  ? insulin aspart  0-15 Units Subcutaneous TID WC  ? insulin aspart  0-5 Units Subcutaneous QHS  ? lactulose  10 g Oral TID  ? metoprolol succinate  25 mg Oral QHS  ? pantoprazole  40 mg Oral BID AC  ? polyethylene glycol  17 g Oral Daily  ? potassium chloride  40 mEq Oral QID  ? pravastatin  20 mg Oral QHS  ? senna-docusate  2 tablet Oral BID  ? sertraline  50 mg Oral Daily  ? sodium chloride flush  10-40 mL Intracatheter Q12H  ? spironolactone  25 mg Oral Daily  ? tamsulosin  0.4 mg Oral Daily  ? torsemide  100  mg Oral BID  ? ? ?Infusions: ? sodium chloride 10 mL/hr at 07/12/21 0734  ? ? ?PRN Medications: ?sodium chloride, acetaminophen **OR** acetaminophen, ipratropium-albuterol, ondansetron **OR** ondansetron (ZOFRAN) IV, polyethylene glycol, sodium chloride, sodium chloride flush, traMADol, zolpidem ? ? ? ?Patient Profile  ? ?66 y/o male w/ chronic diastolic heart failure w/ suspected RV failure, OSA, obesity, h/o PE/DVT, PAF, HTN and h/o poor compliance, admitted w/ a/c heart failure w/ fluid overload.  ? ?Assessment/Plan  ? ? ?1. Acute on Chronic Diastolic CHF with suspected RV dysfunction: Echo 7/22 with EF 60-65%, mild LVH, normal RV, unable to estimate PA systolic pressure. Multiple CHF admissions despite aggressive outpatient diuretic regimen and paramedicine.  RHC (7/22) with optimized filling pressures, PCWP 13, CO/CI 7.3/2.4. Received IV lasix 80 mg through paramedicine 11/12/20. RHC (8/22) with normal filling pressures; underwent placement of Cardiomems (dPAP goal 10 mmHg). Now admitted w/ marked volume overload and slight progression to NYHA Class lllb symptoms.  Suspect poor compliance w/ home diuretic regimen. Most recent Cardiomems transmissions have shown dPAP to be at or below goal, but exam more suggestive of R>L sided heart failure. BNP only 79 but may be falsely low in setting of morbid obesity. Echo EF 60-65% RV normal this admission.   ?- He diuresed well, weight down total of 20 lb. He still has a lot of peripheral edema but think that may be more due to chronic venous insufficiency at this point than CHF.  ?- Continue torsemide 100 mg BID + 2.5 mg metolazone M, W, F. Give metolazone today after potassium rechecked.  ?- Aggressive K supplementation today, repeat BMET in pm. When he goes home, should go back to home K regimen.   ?- Replace Unna boots. Reports he was having UNNA boots placed at home, will check with CSW to see if this will be resumed at discharge. ?- Continue Spironolactone 25 mg  daily, switch to eplerenone 100 mg daily when he goes home.  ?- Off Jardiance (GI upset). ?- Continue Toprol XL 25 mg daily  ?- At d/c will need to lower cardiomems goal.  ?2. H/o PE/DVT: Suspect due to sedentary lifestyle.  Occurred in 2/21.  V/Q scan in 6/21 did not show evidence for chronic PE.  CT scan this admit limited because of suboptimal opacification and artifact, however is suggestive of bilateral lower lobe segmental and subsegmental pulm emboli but no evidence of Rt heart strain but not certain of diagnosis due to quality of scan. Unable to get V/Q scan this admission due to size.  LE venous dopplers negative for DVT ?- Discussed with [facility], would continue Eliquis for now, make sure he is fully compliant.  ?3. OSA: Cannot tolerate CPAP. ?4. Anxiety/depression: This is a major issue.   ?- Continue sertraline 50 mg daily.  ?5. CAD: Cath 1/22 with no significant CAD. No ischemic CP. HS trop this admit negative x 2. No chest pain.  ?- on statin + ? blocker.  ?6. Hypertension: Stable. ?- Continue Toprol XL 25 mg bid.   ?- Continue spironlactone 25 mg daily  ?7. Atrial fibrillation: Paroxysmal. In SR today.  ?- Continue apixaban. No bleeding issues. ?- Continue Toprol XL. ?8. Bilateral Lower extremity erythema: Admitted 5/22 with possible BLE cellulitis. Since then, he has been on multiple rounds of Keflex, Levaquin and doxycycline over the last several months. Previously discussed with Janene Madeira, NP with ID, and recommended starting cefadroxil 1000 mg bid x 7 days, however he took this for 2 days and stopped due to GI upset. Compliance has remained a barrier. He continues w/ b/l LE erythema on exam but suspect most likely due to chronic venous stasis/ dependent rubor + CHF. Doubt cellulitis. WBC normal.  ?- c/w unna boots ?9. Cirrhosis: CT scan noted  nodular contour of liver, compatible with underlying cirrhosis. In setting of known RV dysfunction. LFTs ok. EGD 12/22 showed Portal hypertensive  gastropathy. No varices. NH3 mildly elevated, 38 w/ mild confusion on admit.  ?- Lactulose ordered by primary team (also takes this for chronic constipation)  ?- on spiro + ? blocker  ?10. Anemia: Hgb 10.2>>8.9  >> 9

## 2021-07-14 NOTE — Plan of Care (Signed)
?  Problem: Clinical Measurements: ?Goal: Ability to maintain clinical measurements within normal limits will improve ?Outcome: Progressing ?Goal: Will remain free from infection ?Outcome: Progressing ?Goal: Cardiovascular complication will be avoided ?Outcome: Progressing ?  ?Problem: Elimination: ?Goal: Will not experience complications related to bowel motility ?Outcome: Progressing ?  ?

## 2021-07-14 NOTE — Assessment & Plan Note (Addendum)
SP IV iron, follow iron panel as outpatient.  ?

## 2021-07-14 NOTE — Assessment & Plan Note (Addendum)
Renal; function with serum cr at 1,25  ?

## 2021-07-14 NOTE — Progress Notes (Signed)
Date and time results received: 07/14/2021 1130 ? ?Test: K+ ?Critical Value: 2.3 ? ?Name of Provider Notified: Murray Hodgkins, MD ? ?Orders Received? Or Actions Taken?:  ?K+: 10 mEq/19m x3 ?

## 2021-07-14 NOTE — Progress Notes (Signed)
?Progress Note ? ? ?Patient: Charles Gray OXB:[id number] DOB: 04-Apr-1956 DOA: 07/07/2021     1 ?DOS: the patient was seen and examined on 07/14/2021 ?  ?Brief hospital course: ?2yom PMH including diastolic CHF presented with bilateral lower extremity edema, dyspnea, was set for discharge from ED but had CP.  Work-up revealed suspected bilateral pulmonary emboli.  Admitted for treatment of CHF and possible PE.  Seen by cardiology for marked volume overload and progression to class IIIb NYHA symptoms in setting of poor compliance with home diuretic regimen.  Unna boots applied, PICC line placed to follow CVP.  Diuresed.  After discussion with cardiology, recommendation was to continue apixaban and not repeat CTA.  Could not obtain V/Q.  Hospitalization prolonged by persistent hypokalemia. ? ?Assessment and Plan: ?* Acute on chronic diastolic CHF (congestive heart failure) (HCC) ?-- Seen by advanced heart failure, treated aggressively with Lasix and then transition to oral diuretic therapy 100 mg torsemide twice daily and 2.5 mg metolazone 3 times a week.  Volume status appears stable at this time but hospitalization prolonged by refractory hypokalemia. ?-- Continue apixaban, metoprolol, spironolactone ? ?Acute pulmonary embolism ([place]) ?-- Secondary to sedentary lifestyle.  CT scan this admission was a limited with there is no evidence of right heart strain, unable to obtain VQ scan secondary to size, lower extremity venous Dopplers were negative with DVT.  In discussion with Dr. [name], plan is to continue apixaban at this time. ? ?Hypokalemia ?-- Refractory, remains low despite very aggressive repletion.  Magnesium within normal limits. ?-- Continue repletion ? ?Paroxysmal atrial fibrillation (HCC) ?-- Appears stable.  Sinus rhythm today.  Continue beta-blocker and apixaban. ? ? ?Lymphedema ?-- Continue Unna boots, follow-up suggested with lymphedema clinic. ? ?COPD with acute exacerbation ([place]) ?-- Resolved.   Continue bronchodilators. ? ? ?Iron deficiency anemia ?--Got IV iron ?--outpatient follow-up ? ?Essential hypertension ?-- Appears stable.  Continue beta-blocker and spironolactone. ? ? ? ?GERD without esophagitis ?-- PPI ? ?Cirrhosis of liver without ascites (HCC) ?--CT scan noted  nodular contour of liver, compatible with underlying cirrhosis. In setting of known RV dysfunction. LFTs ok. EGD 12/22 showed Portal hypertensive gastropathy. No varices. NH3 mildly elevated, 38 w/ mild confusion on admit.  ?--follow-up as an outpatient ? ? ? ? ?  ? ?Subjective:  ?AFVSS ?Level 5 mobility, ambulated 224f yesterday ?No overnight events noted ? ?Hard to get a deep breath at times ? ?Physical Exam: ?Vitals:  ? 07/14/21 0328 07/14/21 0349 07/14/21 0733 07/14/21 1612  ?BP:  124/60 117/65 120/65  ?Pulse:  76 65 67  ?Resp:  18 17 18   ?Temp:  98 ?F (36.7 ?C) 97.7 ?F (36.5 ?C) 97.9 ?F (36.6 ?C)  ?TempSrc:  Oral Oral Oral  ?SpO2:  95% 96% 96%  ?Weight: (!) 170.4 kg     ?Height:      ? ?Physical Exam ?Vitals reviewed.  ?Constitutional:   ?   General: He is not in acute distress. ?   Appearance: He is ill-appearing. He is not toxic-appearing.  ?Cardiovascular:  ?   Rate and Rhythm: Normal rate and regular rhythm.  ?   Heart sounds: No murmur heard. ?   Comments: Telemetry SR ?Pulmonary:  ?   Effort: Pulmonary effort is normal. No respiratory distress.  ?   Breath sounds: No wheezing, rhonchi or rales.  ?Musculoskeletal:  ?   Right lower leg: Edema present.  ?   Left lower leg: Edema present.  ?Neurological:  ?   Mental Status:  He is alert.  ?Psychiatric:     ?   Mood and Affect: Mood normal.     ?   Behavior: Behavior normal.  ? ? ?Data Reviewed: ? ?CBG stable ?K+ up to 3.3, Mg 2.3 ?Hgb stable 10.5 ? ?Family Communication: none ? ?Disposition: ?Status is: Inpatient ?Remains inpatient appropriate because: severe hypokalemia ? ?Planned Discharge Destination: Home ? ? ? ? ?Time spent: 20 minutes ? ?Author: ?Murray Hodgkins,  MD ?07/14/2021 5:10 PM ? ?For on call review [email or web address].  ?

## 2021-07-14 NOTE — Hospital Course (Addendum)
Mr. Charles Gray was admitted to the hospital with the working diagnosis of decompensated heart failure.  ? ?66 yo male with the past medical history including diastolic CHF, paroxysmal atrial fibrillation, DVT, PE, cirrhosis, COPD and obesity who presented with bilateral lower extremity edema, dyspnea, and chest pain.  On his initial physical examination his blood pressure was 123/75, HR 74, RR 14 and 02 saturation 97%, lungs with scattered rhonchi bilaterally, heart with S1 and S2 present and rhythmic, abdomen not distended and positive lower extremity edema.  ? ?Na 138, K 3,4 Cl 98, bicarbonate 30 glucose 120 bun 10 cr 1.1 ?Wbc 9,3 hgb 10,1 hct 34.4 plt 179  ?Sars [phone] negative. ? ?EKG 108 bpm, left axis deviation, left anterior fascicular block, sinus rhythm with poor R wave progression, no significant ST segment or T wave changes.  ? ?Chest radiograph with hilar vascular congestion.  ?CT chest with suspected bilateral lower lobe segmental and subsegmental pulmonary embolism.  ? ?Patient was placed on furosemide for diuresis.  ? ?Unna boots applied, PICC line placed to follow CVP.   ?Improved volume status.  ? ?After discussion with cardiology, recommendation was to continue apixaban and not repeat CTA.  Could not obtain V/Q.  Hospitalization prolonged by persistent hypokalemia.  ? ?Patient will follow up as outpatient next week, check renal function and electrolytes.  ?

## 2021-07-14 NOTE — Progress Notes (Signed)
Mobility Specialist Progress Note  ? ? 07/14/21 1128  ?Mobility  ?Activity Ambulated with assistance in hallway  ?Level of Assistance Standby assist, set-up cues, supervision of patient - no hands on  ?Assistive Device Other (Comment) ?(EVA walker)  ?Distance Ambulated (ft) 280 ft  ?Activity Response Tolerated fair  ?$Mobility charge 1 Mobility  ? ?Pt received sitting EOB and agreeable. No complaints on walk. Took x1 standing rest break. Returned to sitting EOB with call bell in reach.   ? ?Hildred Alamin ?Mobility Specialist  ?  ?

## 2021-07-14 NOTE — Assessment & Plan Note (Signed)
--  concerning for cirrhosis ?--follow-up as an outpatient ?

## 2021-07-15 ENCOUNTER — Other Ambulatory Visit (HOSPITAL_COMMUNITY): Payer: Self-pay

## 2021-07-15 ENCOUNTER — Encounter (HOSPITAL_COMMUNITY): Payer: Self-pay | Admitting: Internal Medicine

## 2021-07-15 DIAGNOSIS — K219 Gastro-esophageal reflux disease without esophagitis: Secondary | ICD-10-CM

## 2021-07-15 DIAGNOSIS — I5033 Acute on chronic diastolic (congestive) heart failure: Secondary | ICD-10-CM | POA: Diagnosis not present

## 2021-07-15 DIAGNOSIS — I480 Paroxysmal atrial fibrillation: Secondary | ICD-10-CM

## 2021-07-15 DIAGNOSIS — I2699 Other pulmonary embolism without acute cor pulmonale: Secondary | ICD-10-CM | POA: Diagnosis not present

## 2021-07-15 DIAGNOSIS — K7460 Unspecified cirrhosis of liver: Secondary | ICD-10-CM | POA: Diagnosis not present

## 2021-07-15 DIAGNOSIS — I890 Lymphedema, not elsewhere classified: Secondary | ICD-10-CM | POA: Diagnosis not present

## 2021-07-15 LAB — BASIC METABOLIC PANEL
Anion gap: 12 (ref 5–15)
BUN: 13 mg/dL (ref 8–23)
CO2: 35 mmol/L — ABNORMAL HIGH (ref 22–32)
Calcium: 9 mg/dL (ref 8.9–10.3)
Chloride: 88 mmol/L — ABNORMAL LOW (ref 98–111)
Creatinine, Ser: 1.23 mg/dL (ref 0.61–1.24)
GFR, Estimated: >60 mL/min (ref >60)
Glucose, Bld: 166 mg/dL — ABNORMAL HIGH (ref 70–99)
Potassium: 2.6 mmol/L — CL (ref 3.5–5.1)
Sodium: 135 mmol/L (ref 135–145)

## 2021-07-15 LAB — PROTEIN S ACTIVITY: Protein S Activity: 53 % — ABNORMAL LOW (ref 63–140)

## 2021-07-15 LAB — GLUCOSE, CAPILLARY
Glucose-Capillary: 178 mg/dL — ABNORMAL HIGH (ref 70–99)
Glucose-Capillary: 112 mg/dL — ABNORMAL HIGH (ref 70–99)
Glucose-Capillary: 125 mg/dL — ABNORMAL HIGH (ref 70–99)
Glucose-Capillary: 125 mg/dL — ABNORMAL HIGH (ref 70–99)
Glucose-Capillary: 116 mg/dL — ABNORMAL HIGH (ref 70–99)

## 2021-07-15 LAB — HEXAGONAL PHASE PHOSPHOLIPID: Hexagonal Phase Phospholipid: 10 s (ref 0–11)

## 2021-07-15 LAB — PROTEIN S, TOTAL: Protein S Ag, Total: 92 % (ref 60–150)

## 2021-07-15 LAB — PROTEIN C ACTIVITY: Protein C Activity: 70 % — ABNORMAL LOW (ref 73–180)

## 2021-07-15 LAB — PTT-LA MIX: PTT-LA Mix: 45.6 s — ABNORMAL HIGH (ref 0.0–40.5)

## 2021-07-15 LAB — MAGNESIUM: Magnesium: 2.1 mg/dL (ref 1.7–2.4)

## 2021-07-15 LAB — LUPUS ANTICOAGULANT PANEL
DRVVT: 45.1 s (ref 0.0–47.0)
PTT Lupus Anticoagulant: 50.8 s — ABNORMAL HIGH (ref 0.0–43.5)

## 2021-07-15 LAB — POTASSIUM: Potassium: 2.8 mmol/L — ABNORMAL LOW (ref 3.5–5.1)

## 2021-07-15 MED ORDER — POTASSIUM CHLORIDE 20 MEQ PO PACK
100.0000 meq | PACK | Freq: Two times a day (BID) | ORAL | Status: DC
  Administered 2021-07-15 (×2): 100 meq via ORAL
  Filled 2021-07-15 (×2): qty 5

## 2021-07-15 MED ORDER — POTASSIUM CHLORIDE 20 MEQ PO PACK
40.0000 meq | PACK | Freq: Once | ORAL | Status: AC
  Administered 2021-07-15: 40 meq via ORAL
  Filled 2021-07-15: qty 2

## 2021-07-15 MED ORDER — POTASSIUM CHLORIDE CRYS ER 20 MEQ PO TBCR: 40.0000 meq | EXTENDED_RELEASE_TABLET | Freq: Once | ORAL | Status: DC

## 2021-07-15 MED ORDER — POTASSIUM CHLORIDE 10 MEQ/100ML IV SOLN
10.0000 meq | INTRAVENOUS | Status: DC
  Administered 2021-07-15 (×4): 10 meq via INTRAVENOUS
  Filled 2021-07-15 (×5): qty 100

## 2021-07-15 MED ORDER — POTASSIUM CHLORIDE 20 MEQ PO PACK
60.0000 meq | PACK | Freq: Once | ORAL | Status: AC
  Administered 2021-07-15: 60 meq via ORAL
  Filled 2021-07-15: qty 3

## 2021-07-15 MED ORDER — POTASSIUM CHLORIDE 10 MEQ/50ML IV SOLN
10.0000 meq | INTRAVENOUS | Status: AC
  Administered 2021-07-15 (×3): 10 meq via INTRAVENOUS
  Filled 2021-07-15 (×3): qty 50

## 2021-07-15 MED ORDER — EPLERENONE 25 MG PO TABS
100.0000 mg | ORAL_TABLET | Freq: Every day | ORAL | Status: DC
  Administered 2021-07-15 – 2021-07-17 (×3): 100 mg via ORAL
  Filled 2021-07-15 (×3): qty 4

## 2021-07-15 MED ORDER — NON FORMULARY: 100.0000 mg | Freq: Every day | Status: DC

## 2021-07-15 NOTE — Progress Notes (Signed)
Occupational Therapy Treatment ?Patient Details ?Name: Charles Gray ?MRN: [id number] ?DOB: 05/19/1955 ?Today's Date: 07/15/2021 ? ? ?History of present illness Pt is a 66 y.o. M who presents 07/07/2021 with heat failure with fluid overload.  CT scan this admit limited because of suboptimal opacification and artifact, however is suggestive of bilateral lower lobe segmental and subsegmental pulm emboli but no evidence of Rt heart strain. Follow up V/Q scan has been ordered. LE veous dopplers negative for DVT. Significant PMH: chronic diastolic heart failure, OSA, obesity, history of PE/DVT, PAF, HTN. ?  ?OT comments ? Patient received in supine and agreeable to participate with OT. Patient able to get to EOB with increased time. Reacher was provided to address donning shorts but patient states he can perform without and was supervision to thread over feet and to pull up while standing with increased time to stand. Patient required cues to recall sock aide use and min assist to perform with wide sock aide. Patient continues to make good progress with OT treatment and would benefit from [facility] after discharge to increase carryover for AE use with LB dressing.   ? ?Recommendations for follow up therapy are one component of a multi-disciplinary discharge planning process, led by the attending physician.  Recommendations may be updated based on patient status, additional functional criteria and insurance authorization. ?   ?Follow Up Recommendations ? Home health OT ([facility] services)  ?  ?Assistance Recommended at Discharge Intermittent Supervision/Assistance  ?Patient can return home with the following ? A little help with walking and/or transfers;A little help with bathing/dressing/bathroom;Assistance with cooking/housework;Assist for transportation;Help with stairs or ramp for entrance ?  ?Equipment Recommendations ? None recommended by OT  ?  ?Recommendations for Other Services   ? ?  ?Precautions / Restrictions  Precautions ?Precautions: Fall ?Restrictions ?Weight Bearing Restrictions: No  ? ? ?  ? ?Mobility Bed Mobility ?Overal bed mobility: Modified Independent ?  ?  ?  ?  ?  ?  ?General bed mobility comments: able to get to EOB without assistance from supine, seated on EOB at EOB treatment ?  ? ?Transfers ?Overall transfer level: Modified independent ?  ?  ?  ?  ?  ?  ?  ?  ?General transfer comment: sit to stands from EOB to pull up clothing with supervision ?  ?  ?Balance Overall balance assessment: Needs assistance ?Sitting-balance support: Feet supported, No upper extremity supported ?Sitting balance-Leahy Scale: Good ?  ?  ?Standing balance support: No upper extremity supported, During functional activity ?Standing balance-Leahy Scale: Fair ?Standing balance comment: able to use BUE to pull up clothing ?  ?  ?  ?  ?  ?  ?  ?  ?  ?  ?  ?   ? ?ADL either performed or assessed with clinical judgement  ? ?ADL Overall ADL's : Needs assistance/impaired ?  ?  ?  ?  ?  ?  ?  ?  ?  ?  ?Lower Body Dressing: Minimal assistance;Sit to/from stand;With adaptive equipment ?Lower Body Dressing Details (indicate cue type and reason): AE training with sock aide for donning socks, donned shorts without adaptive equipment ?  ?  ?  ?  ?  ?  ?  ?General ADL Comments: states he can donn  shorts without AE, instructed patient that using reacher could help with back issues ?  ? ?Extremity/Trunk Assessment   ?  ?  ?  ?  ?  ? ?Vision   ?  ?  ?  Perception   ?  ?Praxis   ?  ? ?Cognition Arousal/Alertness: Awake/alert ?Behavior During Therapy: Flat affect ?Overall Cognitive Status: Within Functional Limits for tasks assessed ?  ?  ?  ?  ?  ?  ?  ?  ?  ?  ?  ?  ?  ?  ?  ?  ?General Comments: required cues to recall sock aide use ?  ?  ?   ?Exercises   ? ?  ?Shoulder Instructions   ? ? ?  ?General Comments    ? ? ?Pertinent Vitals/ Pain       Pain Assessment ?Pain Assessment: Faces ?Pain Location: back ?Pain Descriptors / Indicators:  Grimacing ?Pain Intervention(s): Monitored during session, Repositioned ? ?Home Living   ?  ?  ?  ?  ?  ?  ?  ?  ?  ?  ?  ?  ?  ?  ?  ?  ?  ?  ? ?  ?Prior Functioning/Environment    ?  ?  ?  ?   ? ?Frequency ? Min 2X/week  ? ? ? ? ?  ?Progress Toward Goals ? ?OT Goals(current goals can now be found in the care plan section) ? Progress towards OT goals: Progressing toward goals ? ?Acute Rehab OT Goals ?Patient Stated Goal: go home ?OT Goal Formulation: With patient ?Time For Goal Achievement: 07/24/21 ?Potential to Achieve Goals: Good ?ADL Goals ?Pt Will Perform Lower Body Bathing: with modified independence;with adaptive equipment;sit to/from stand ?Pt Will Perform Lower Body Dressing: with modified independence;with adaptive equipment;sit to/from stand ?Pt Will Transfer to Toilet: with modified independence;ambulating ?Pt Will Perform Toileting - Clothing Manipulation and hygiene: with modified independence;sit to/from stand;sitting/lateral leans;with adaptive equipment ?Additional ADL Goal #1: PT will identify 3 strateiges to help manage CHF  ?Plan Discharge plan remains appropriate   ? ?Co-evaluation ? ? ?   ?  ?  ?  ?  ? ?  ?AM-PAC OT "6 Clicks" Daily Activity     ?Outcome Measure ? ? Help from another person eating meals?: None ?Help from another person taking care of personal grooming?: A Little ?Help from another person toileting, which includes using toliet, bedpan, or urinal?: A Little ?Help from another person bathing (including washing, rinsing, drying)?: A Little ?Help from another person to put on and taking off regular upper body clothing?: A Little ?Help from another person to put on and taking off regular lower body clothing?: A Little ?6 Click Score: 19 ? ?  ?End of Session   ? ?OT Visit Diagnosis: Unsteadiness on feet (R26.81);Other abnormalities of gait and mobility (R26.89);Muscle weakness (generalized) (M62.81);Pain ?  ?Activity Tolerance Patient tolerated treatment well ?  ?Patient Left in  bed;with call bell/phone within reach ?  ?Nurse Communication Mobility status ?  ? ?   ? ?Time: 2952-8413 ?OT Time Calculation (min): 25 min ? ?Charges: OT General Charges ?$OT Visit: 1 Visit ?OT Treatments ?$Self Care/Home Management : 23-37 mins ? ?Lodema Hong, OTA ?Acute Rehabilitation Services  ?Pager [phone] ?Office [phone] ? ? ?[place] ?07/15/2021, 9:27 AM ?

## 2021-07-15 NOTE — TOC CM/SW Note (Addendum)
PT recommended. Bilateral platform walker or a walker that fits height of 6'10". [place]. Ordered Platform Bariatric RW to be shipped to home. Plan dc home via PTAR. PTAR paperwork on shadow chart. Jonnie Finner RN3 CCM, Heart Failure TOC CM [phone]  ?

## 2021-07-15 NOTE — Progress Notes (Signed)
Orthopedic Tech Progress Note ?Patient Details:  ?Charles Gray ?12-08-1955 ?[id number] ? ?Ortho Devices ?Type of Ortho Device: Unna boot ?Ortho Device/Splint Location: BLE ?Ortho Device/Splint Interventions: Ordered, Application ?  ?Post Interventions ?Patient Tolerated: Well ?Instructions Provided: Care of device ? ?Janit Pagan ?07/15/2021, 9:37 AM ? ?

## 2021-07-15 NOTE — Progress Notes (Signed)
Mobility Specialist Progress Note ? ? 07/15/21 1308  ?Mobility  ?Activity Ambulated with assistance in hallway  ?Level of Assistance Contact guard assist, steadying assist  ?Assistive Device  ?Ethelene Hal)  ?Distance Ambulated (ft) 250 ft  ?Activity Response Tolerated well  ?$Mobility charge 1 Mobility  ? ?Post Mobility: 88 HR, 130/68 BP, 97% SpO2 ? ?Received pt EOB having no complaints and agreeable. X3 standing rest breaks d/t shoulder fatigue, and general weakness. Required mod cues to maintain upright posture while using AD to combat shoulder fatigue. Towards end of ambulation pt c/o feeling dizzy but denying needing to sit. Able to get back to EOB where vitals were checked and stable. Pt dizziness subsided once seated. Left w/ call bell by side and RN notified. ? ?Holland Falling ?Mobility Specialist ?Phone Number [phone] ? ?

## 2021-07-15 NOTE — Progress Notes (Addendum)
Physical Therapy Treatment ?Patient Details ?Name: Charles Gray ?MRN: [id number] ?DOB: 09/15/1955 ?Today's Date: 07/15/2021 ? ? ?History of Present Illness Pt is a 66 y.o. M who presents 07/07/2021 with heat failure with fluid overload.  CT scan this admit limited because of suboptimal opacification and artifact, however is suggestive of bilateral lower lobe segmental and subsegmental pulm emboli but no evidence of Rt heart strain. Follow up V/Q scan has been ordered. LE veous dopplers negative for DVT. Significant PMH: chronic diastolic heart failure, OSA, obesity, history of PE/DVT, PAF, HTN. ? ?  ?PT Comments  ? ? Pt received seated EOB, agreeable to therapy session with encouragement, with good participation and fair tolerance for gait and transfer training using bilateral platform rolling walker. Pt needing up to minA with mod cues for upright posture, body mechanics and to address gait deviations and reports moderate to severe fatigue after short household distance gait task. Of note, pt worked with mobility specialist prior to PT session. Pt continues to benefit from PT services to progress toward functional mobility goals.    ?Recommendations for follow up therapy are one component of a multi-disciplinary discharge planning process, led by the attending physician.  Recommendations may be updated based on patient status, additional functional criteria and insurance authorization. ? ?Follow Up Recommendations ? Home health PT ?  ?  ?Assistance Recommended at Discharge PRN  ?Patient can return home with the following A little help with bathing/dressing/bathroom;Assistance with cooking/housework;Assist for transportation;Help with stairs or ramp for entrance;Other (comment) (PTAR transport into home) ?  ?Equipment Recommendations ? Rolling walker (2 wheels);Other (comment) (Bariatric bilateral platform walker)  ?  ?Recommendations for Other Services   ? ? ?  ?Precautions / Restrictions Precautions ?Precautions:  Fall ?Restrictions ?Weight Bearing Restrictions: No  ?  ? ?Mobility ? Bed Mobility ?  ?  ?  ?  ?  ?  ?  ?General bed mobility comments: received/remained seated EOB ?  ? ?Transfers ?Overall transfer level: Needs assistance ?Equipment used: Bilateral platform walker (bariatric) ?Transfers: Sit to/from Stand ?Sit to Stand: Min guard ?  ?  ?  ?  ?  ?General transfer comment: sit to stand from EOB to PF RW, fair eccentric control to sit ?  ? ?Ambulation/Gait ?Ambulation/Gait assistance: Min assist, +2 safety/equipment ?Gait Distance (Feet): 45 Feet ?Assistive device: Rolling walker (2 wheels), Bilateral platform walker ?Gait Pattern/deviations: Step-through pattern, Decreased stride length, Trunk flexed, Wide base of support ?Gait velocity: decreased ?  ?  ?General Gait Details: Slow, mostly steady gait with heavy BUE reliance on platform surfaces. Decreased foot clearance bilaterally. A few standing rest breaks. Notably, RW was too narrow due to body habitus so next session recommend bariatric size RW with B PF attachments be used for proper fit. ? ? ?Stairs ?Stairs:  (defer due to pt B knee pain) ?  ?  ?  ?  ? ? ?Wheelchair Mobility ?  ? ?Modified Rankin (Stroke Patients Only) ?  ? ? ?  ?Balance Overall balance assessment: Needs assistance ?Sitting-balance support: Feet supported, No upper extremity supported ?Sitting balance-Leahy Scale: Good ?  ?  ?Standing balance support: No upper extremity supported, During functional activity ?Standing balance-Leahy Scale: Fair ?Standing balance comment: heavily reliant on BUE support during gait ?  ?  ?  ?  ?  ?  ?  ?  ?  ?  ?  ?  ? ?  ?Cognition Arousal/Alertness: Awake/alert ?Behavior During Therapy: Flat affect ?Overall Cognitive Status: Within Functional Limits for tasks  assessed ?  ?  ?  ?  ?  ?  ?  ?  ?  ?  ?  ?  ?  ?  ?  ?  ?General Comments: Required repetitive cues for posture and AD use; pt c/o fatigue after recent ambulation with mobility specialist and mildly  anxious regarding straps on platform RW due to claustrophobia ?  ?  ? ?  ?Exercises   ? ?  ?General Comments General comments (skin integrity, edema, etc.): HR 77 bpm resting; HR to 99 bpm with exertion, mild DOE ?  ?  ? ?Pertinent Vitals/Pain Pain Assessment ?Pain Assessment: Faces ?Faces Pain Scale: Hurts little more ?Pain Location: B knees ?Pain Descriptors / Indicators: Grimacing ?Pain Intervention(s): Monitored during session, Repositioned  ? ? ? ?PT Goals (current goals can now be found in the care plan section) Acute Rehab PT Goals ?Patient Stated Goal: walk more ?PT Goal Formulation: With patient ?Time For Goal Achievement: 07/23/21 ?Progress towards PT goals: Progressing toward goals ? ?  ?Frequency ? ? ? Min 3X/week ? ? ? ?  ?PT Plan Current plan remains appropriate  ? ? ?   ?AM-PAC PT "6 Clicks" Mobility   ?Outcome Measure ? Help needed turning from your back to your side while in a flat bed without using bedrails?: None ?Help needed moving from lying on your back to sitting on the side of a flat bed without using bedrails?: A Little ?Help needed moving to and from a bed to a chair (including a wheelchair)?: A Little ?Help needed standing up from a chair using your arms (e.g., wheelchair or bedside chair)?: A Little ?Help needed to walk in hospital room?: A Lot (mod cues for technque) ?Help needed climbing 3-5 steps with a railing? : Total ?6 Click Score: 16 ? ?  ?End of Session Equipment Utilized During Treatment: Gait belt ?Activity Tolerance: Patient limited by pain;Patient limited by fatigue (B knee pain) ?Patient left: in bed;with call bell/phone within reach;with nursing/sitter in room;Other (comment) (seated EOB) ?Nurse Communication: Mobility status;Other (comment) (case mgr notified pt needs DME for home) ?PT Visit Diagnosis: Unsteadiness on feet (R26.81);Muscle weakness (generalized) (M62.81);Difficulty in walking, not elsewhere classified (R26.2) ?  ? ? ?Time: 7711-6579 ?PT Time Calculation  (min) (ACUTE ONLY): 13 min ? ?Charges:  $Gait Training: 8-22 mins          ?          ? ?Marria Mathison P., PTA ?Acute Rehabilitation Services ?Secure Chat Preferred 9a-5:30pm ?Office: [phone]  ? ? ?Kara Pacer Mats [place] ?07/15/2021, 3:42 PM ? ?

## 2021-07-15 NOTE — Progress Notes (Signed)
?Progress Note ? ? ?Patient: Charles Gray BTD:[id number] DOB: 08/12/1955 DOA: 07/07/2021     2 ?DOS: the patient was seen and examined on 07/15/2021 ?  ?Brief hospital course: ?89yom PMH including diastolic CHF presented with bilateral lower extremity edema, dyspnea, was set for discharge from ED but had CP.  Work-up revealed suspected bilateral pulmonary emboli.  Admitted for treatment of CHF and possible PE.  Seen by cardiology for marked volume overload and progression to class IIIb NYHA symptoms in setting of poor compliance with home diuretic regimen.  Unna boots applied, PICC line placed to follow CVP.  Diuresed.  Volume status now stable. After discussion with cardiology, recommendation was to continue apixaban and not repeat CTA.  Could not obtain V/Q.  Hospitalization prolonged by persistent hypokalemia which is preventing discharge. ? ?Assessment and Plan: ?* Acute on chronic diastolic CHF (congestive heart failure) (HCC) ?-- Seen by advanced heart failure, treated aggressively with Lasix and then transition to oral diuretic therapy 100 mg torsemide twice daily and 2.5 mg metolazone 3 times a week.  Volume status appears stable at this time but hospitalization prolonged by refractory hypokalemia. ?-- Continue apixaban, metoprolol. Eplerenone resumed.  ? ?Acute pulmonary embolism ([place]) ?-- Secondary to sedentary lifestyle.  CT scan this admission was a limited with there is no evidence of right heart strain, unable to obtain VQ scan secondary to size, lower extremity venous Dopplers were negative with DVT.  In discussion with Dr. [name], plan is to continue apixaban at this time. Compliance as outpatient is questioned, per [facility] Comment, paramedicine has reported noncompliance.  ? ?Hypokalemia ?-- Refractory, remains low despite very aggressive repletion. Pt and RN report compliance with meds Magnesium within normal limits. ?-- Continue aggressive repletion, Mg at goal, eplerenone resumed, spironolactone  stopped.  ? ?Paroxysmal atrial fibrillation (HCC) ?-- Appears stable.  Sinus rhythm today.  Continue beta-blocker and apixaban. ? ? ?Lymphedema ?-- Continue Unna boots, follow-up suggested with lymphedema clinic. Continue HH twice weekly changes. ? ?COPD with acute exacerbation ([place]) ?-- Resolved.  Continue bronchodilators. ? ? ?Iron deficiency anemia ?--Got IV iron ?--outpatient follow-up ? ?Essential hypertension ?-- Appears stable.  Continue beta-blocker and eplerenone. ? ? ? ?GERD without esophagitis ?-- PPI ? ?Cirrhosis of liver without ascites (HCC) ?--CT scan noted  nodular contour of liver, compatible with underlying cirrhosis. In setting of known RV dysfunction. LFTs ok. EGD 12/22 showed Portal hypertensive gastropathy. No varices. NH3 mildly elevated, 38 w/ mild confusion on admit.  ?--continue lactulose ?--follow-up as an outpatient ? ? ? ? ?  ? ?Subjective:  ?Feels ok today ?Reports taking all his medications. ? ?Physical Exam: ?Vitals:  ? 07/15/21 0418 07/15/21 0727 07/15/21 1053 07/15/21 1127  ?BP: 126/69 121/73 112/63 103/63  ?Pulse: 74 71 69 67  ?Resp: 18 20 18 20   ?Temp: 98.5 ?F (36.9 ?C) 97.6 ?F (36.4 ?C) 98.2 ?F (36.8 ?C) 98.4 ?F (36.9 ?C)  ?TempSrc: Oral Oral Oral Oral  ?SpO2: 99% 99% 96% 95%  ?Weight:      ?Height:      ? ?Physical Exam ?Vitals reviewed.  ?Constitutional:   ?   General: He is not in acute distress. ?   Appearance: He is normal weight. He is not ill-appearing or toxic-appearing.  ?Cardiovascular:  ?   Rate and Rhythm: Normal rate and regular rhythm.  ?   Heart sounds: No murmur heard. ?   Comments: Telemetry SR ?Pulmonary:  ?   Effort: Pulmonary effort is normal. No respiratory distress.  ?  Breath sounds: No wheezing, rhonchi or rales.  ?Musculoskeletal:  ?   Right lower leg: Edema present.  ?   Left lower leg: Edema present.  ?   Comments: Bilateral unna boots in place  ?Neurological:  ?   General: No focal deficit present.  ?   Mental Status: He is alert.  ?Psychiatric:     ?    Mood and Affect: Mood normal.     ?   Behavior: Behavior normal.  ? ? ?Data Reviewed: ? ?CBG stable ?K+ 2.6 ? ?Family Communication: none ? ?Disposition: ?Status is: Inpatient ?Remains inpatient appropriate because: marked hypokalemia  ? Planned Discharge Destination: Home with [facility] HH ? ? ? ?Time spent: 20 minutes ? ?Discussed with Dr. [name]. ? ?Author: ?Murray Hodgkins, MD ?07/15/2021 1:28 PM ? ?For on call review [email or web address].  ?

## 2021-07-15 NOTE — Progress Notes (Addendum)
Date and time results received: 07/15/21 0649 ? ?Test: Potassium ?Critical Value: 2.6 ? ?Name of Provider Notified: AKern Reap ? ?Orders Received? Awaiting response. ? ?0700 Orders received ?

## 2021-07-15 NOTE — Plan of Care (Signed)
?  Problem: Health Behavior/Discharge Planning: ?Goal: Ability to manage health-related needs will improve ?Outcome: Progressing ?  ?Problem: Clinical Measurements: ?Goal: Ability to maintain clinical measurements within normal limits will improve ?Outcome: Progressing ?Goal: Will remain free from infection ?Outcome: Progressing ?Goal: Diagnostic test results will improve ?Outcome: Progressing ?  ?

## 2021-07-15 NOTE — Progress Notes (Addendum)
Patient ID: Charles Gray, male   DOB: 13-Dec-1955, 66 y.o.   MRN: [id number] ?  ? ? Advanced Heart Failure Rounding Note ? ?PCP-Cardiologist: Berniece Salines, DO  ?AHFC: Dr. [name]  ? ?Subjective:   ? ?K remains low, 2.6. Mg 2.1 ? ?He is on eplerenone + torsemide. Strict I/Os not recorded.  ? ?Also on lactulose tid for cirrhosis. ~3 BMs a day. NH3 57>>38 last check (3/27)  ? ?No arrhythmias  on tele. Denies dyspnea. Only complaint is poor sleep. Tired this morning.  ? ? ? ?Objective:   ?Weight Range: ?(!) 170.4 kg ?Body mass index is 39.28 kg/m?.  ? ?Vital Signs:   ?Temp:  [97.6 ?F (36.4 ?C)-98.6 ?F (37 ?C)] 97.6 ?F (36.4 ?C) (04/04 7262) ?Pulse Rate:  [67-80] 71 (04/04 0727) ?Resp:  [18-20] 20 (04/04 0727) ?BP: (116-126)/(65-73) 121/73 (04/04 0727) ?SpO2:  [96 %-100 %] 99 % (04/04 0727) ?Last BM Date : 07/13/21 ? ?Weight change: ?Filed Weights  ? 07/12/21 0449 07/13/21 0447 07/14/21 0328  ?Weight: (!) 173 kg (!) 170.5 kg (!) 170.4 kg  ? ? ?Intake/Output:  ? ?Intake/Output Summary (Last 24 hours) at 07/15/2021 0746 ?Last data filed at 07/15/2021 0730 ?Gross per 24 hour  ?Intake 520.8 ml  ?Output 100 ml  ?Net 420.8 ml  ?  ? ? ?Physical Exam  ? ?General:  chronically ill appearing, obese No respiratory difficulty ?HEENT: normal ?Neck: supple. no JVD. Carotids 2+ bilat; no bruits. No lymphadenopathy or thyromegaly appreciated. ?Cor: PMI nondisplaced. Regular rate & rhythm. No rubs, gallops or murmurs. ?Lungs: clear ?Abdomen: obese, soft, nontender, nondistended. No hepatosplenomegaly. No bruits or masses. Good bowel sounds. ?Extremities: no cyanosis, clubbing, rash, 1+ b/l LE edema, b/l chronic venous stasis dermatitis  ?Neuro: alert & oriented x 3, cranial nerves grossly intact. moves all 4 extremities w/o difficulty. Affect pleasant. ? ?Telemetry  ? ?NSR 70s-80s w/ PACs, personally reviewed  ? ?EKG  ?  ?No new EKG to review  ? ?Labs  ?  ?CBC ?Recent Labs  ?  07/13/21 ?0124 07/14/21 ?0000  ?WBC 10.1 11.8*  ?HGB 10.0* 10.5*   ?HCT 32.9* 35.0*  ?MCV 72.8* 73.7*  ?PLT 172 173  ? ?Basic Metabolic Panel ?Recent Labs  ?  07/14/21 ?0000 07/14/21 ?1057 07/15/21 ?0355  ?NA 132*  --  135  ?K 3.3* 2.3* 2.6*  ?CL 86*  --  88*  ?CO2 33*  --  35*  ?GLUCOSE 135*  --  166*  ?BUN 13  --  13  ?CREATININE 1.15  --  1.23  ?CALCIUM 8.9  --  9.0  ?MG 2.3  --  2.1  ? ?Liver Function Tests ?No results for input(s): AST, ALT, ALKPHOS, BILITOT, PROT, ALBUMIN in the last 72 hours. ? ?No results for input(s): LIPASE, AMYLASE in the last 72 hours. ?Cardiac Enzymes ?No results for input(s): CKTOTAL, CKMB, CKMBINDEX, TROPONINI in the last 72 hours. ? ?BNP: ?BNP (last 3 results) ?Recent Labs  ?  12/21/20 ?0052 04/06/21 ?1454 07/07/21 ?2236  ?BNP 27.2 82.2 79.9  ? ? ?ProBNP (last 3 results) ?No results for input(s): PROBNP in the last 8760 hours. ? ? ?D-Dimer ?No results for input(s): DDIMER in the last 72 hours. ? ?Hemoglobin A1C ?No results for input(s): HGBA1C in the last 72 hours. ? ?Fasting Lipid Panel ?No results for input(s): CHOL, HDL, LDLCALC, TRIG, CHOLHDL, LDLDIRECT in the last 72 hours. ?Thyroid Function Tests ?No results for input(s): TSH, T4TOTAL, T3FREE, THYROIDAB in the last 72 hours. ? ?Invalid  input(s): FREET3 ? ?Other results: ? ? ?Imaging  ? ? ?No results found. ? ? ?Medications:   ? ? ?Scheduled Medications: ? apixaban  10 mg Oral BID  ? Followed by  ? [START ON 07/17/2021] apixaban  5 mg Oral BID  ? buprenorphine-naloxone  1 tablet Sublingual Daily  ? Chlorhexidine Gluconate Cloth  6 each Topical Daily  ? eplerenone  100 mg Oral Daily  ? gabapentin  800 mg Oral TID  ? insulin aspart  0-15 Units Subcutaneous TID WC  ? insulin aspart  0-5 Units Subcutaneous QHS  ? lactulose  10 g Oral TID  ? metoprolol succinate  25 mg Oral QHS  ? pantoprazole  40 mg Oral BID AC  ? polyethylene glycol  17 g Oral Daily  ? potassium chloride  100 mEq Oral BID  ? potassium chloride  40 mEq Oral Once  ? pravastatin  20 mg Oral QHS  ? senna-docusate  2 tablet Oral BID  ?  sertraline  50 mg Oral Daily  ? sodium chloride flush  10-40 mL Intracatheter Q12H  ? tamsulosin  0.4 mg Oral Daily  ? torsemide  100 mg Oral BID  ? ? ?Infusions: ? sodium chloride 10 mL/hr at 07/12/21 0734  ? potassium chloride 10 mEq (07/15/21 0717)  ? ? ?PRN Medications: ?sodium chloride, acetaminophen **OR** acetaminophen, ipratropium-albuterol, ondansetron **OR** ondansetron (ZOFRAN) IV, polyethylene glycol, sodium chloride, sodium chloride flush, traMADol, zolpidem ? ? ? ?Patient Profile  ? ?66 y/o male w/ chronic diastolic heart failure w/ suspected RV failure, OSA, obesity, h/o PE/DVT, PAF, HTN and h/o poor compliance, admitted w/ a/c heart failure w/ fluid overload.  ? ?Assessment/Plan  ? ? ?1. Acute on Chronic Diastolic CHF with suspected RV dysfunction: Echo 7/22 with EF 60-65%, mild LVH, normal RV, unable to estimate PA systolic pressure. Multiple CHF admissions despite aggressive outpatient diuretic regimen and paramedicine.  RHC (7/22) with optimized filling pressures, PCWP 13, CO/CI 7.3/2.4. Received IV lasix 80 mg through paramedicine 11/12/20. RHC (8/22) with normal filling pressures; underwent placement of Cardiomems (dPAP goal 10 mmHg). Now admitted w/ marked volume overload and slight progression to NYHA Class lllb symptoms.  Suspect poor compliance w/ home diuretic regimen. Most recent Cardiomems transmissions have shown dPAP to be at or below goal, but exam more suggestive of R>L sided heart failure. BNP only 79 but may be falsely low in setting of morbid obesity. Echo EF 60-65% RV normal this admission.   ?- He diuresed well, weight down total of 20 lb. He still has a lot of peripheral edema but think that may be more due to chronic venous insufficiency at this point than CHF.  ?- Continue torsemide 100 mg BID + 2.5 mg metolazone M, W, F.  ?- Aggressive K supplementation today, repeat BMET in pm. When he goes home, should go back to home K regimen.   ?- Replace Unna boots. Reports he was  having UNNA boots placed at home, will check with CSW to see if this will be resumed at discharge. ?- On Spironolactone 25 mg daily PTA,  Will switch to eplerenone 100 mg daily today to help w/ K  ?- Off Jardiance (GI upset). ?- Continue Toprol XL 25 mg daily  ?- At d/c will need to lower cardiomems goal.  ?2. H/o PE/DVT: Suspect due to sedentary lifestyle.  Occurred in 2/21.  V/Q scan in 6/21 did not show evidence for chronic PE.  CT scan this admit limited because of suboptimal opacification  and artifact, however is suggestive of bilateral lower lobe segmental and subsegmental pulm emboli but no evidence of Rt heart strain but not certain of diagnosis due to quality of scan. Unable to get V/Q scan this admission due to size. LE venous dopplers negative for DVT ?- Discussed with [facility], would continue Eliquis for now, make sure he is fully compliant.  ?3. OSA: Cannot tolerate CPAP. ?4. Anxiety/depression: This is a major issue.   ?- Continue sertraline 50 mg daily.  ?5. CAD: Cath 1/22 with no significant CAD. No ischemic CP. HS trop this admit negative x 2. No chest pain.  ?- on statin + ? blocker.  ?6. Hypertension: Stable. ?- Continue Toprol XL 25 mg bid.   ?- Switch to eplerenone 100 mg daily today  ?7. Atrial fibrillation: Paroxysmal. In SR today.  ?- Continue apixaban. No bleeding issues. ?- Continue Toprol XL. ?8. Bilateral Lower extremity erythema: Admitted 5/22 with possible BLE cellulitis. Since then, he has been on multiple rounds of Keflex, Levaquin and doxycycline over the last several months. Previously discussed with Janene Madeira, NP with ID, and recommended starting cefadroxil 1000 mg bid x 7 days, however he took this for 2 days and stopped due to GI upset. Compliance has remained a barrier. He continues w/ b/l LE erythema on exam but suspect most likely due to chronic venous stasis/ dependent rubor + CHF. Doubt cellulitis. WBC normal.  ?- c/w unna boots ?9. Cirrhosis: CT scan noted  nodular  contour of liver, compatible with underlying cirrhosis. In setting of known RV dysfunction. LFTs ok. EGD 12/22 showed Portal hypertensive gastropathy. No varices. NH3 mildly elevated, 38 w/ mild confusion on admi

## 2021-07-16 ENCOUNTER — Ambulatory Visit (HOSPITAL_COMMUNITY)
Admission: RE | Admit: 2021-07-16 | Discharge: 2021-07-16 | Disposition: A | Discharge status: Home / Self Care | Payer: Medicare HMO | Source: Ambulatory Visit | Attending: Cardiovascular Disease | Admitting: Cardiovascular Disease

## 2021-07-16 DIAGNOSIS — I10 Essential (primary) hypertension: Secondary | ICD-10-CM | POA: Diagnosis not present

## 2021-07-16 DIAGNOSIS — I5033 Acute on chronic diastolic (congestive) heart failure: Secondary | ICD-10-CM | POA: Diagnosis not present

## 2021-07-16 DIAGNOSIS — I890 Lymphedema, not elsewhere classified: Secondary | ICD-10-CM | POA: Diagnosis not present

## 2021-07-16 DIAGNOSIS — I2699 Other pulmonary embolism without acute cor pulmonale: Secondary | ICD-10-CM | POA: Diagnosis not present

## 2021-07-16 DIAGNOSIS — J441 Chronic obstructive pulmonary disease with (acute) exacerbation: Secondary | ICD-10-CM | POA: Diagnosis not present

## 2021-07-16 DIAGNOSIS — D509 Iron deficiency anemia, unspecified: Secondary | ICD-10-CM | POA: Diagnosis not present

## 2021-07-16 DIAGNOSIS — N179 Acute kidney failure, unspecified: Secondary | ICD-10-CM

## 2021-07-16 DIAGNOSIS — K219 Gastro-esophageal reflux disease without esophagitis: Secondary | ICD-10-CM | POA: Diagnosis not present

## 2021-07-16 DIAGNOSIS — K7460 Unspecified cirrhosis of liver: Secondary | ICD-10-CM | POA: Diagnosis not present

## 2021-07-16 LAB — BASIC METABOLIC PANEL
Anion gap: 10 (ref 5–15)
Anion gap: 10 (ref 5–15)
BUN: 14 mg/dL (ref 8–23)
BUN: 14 mg/dL (ref 8–23)
CO2: 31 mmol/L (ref 22–32)
CO2: 33 mmol/L — ABNORMAL HIGH (ref 22–32)
Calcium: 9 mg/dL (ref 8.9–10.3)
Calcium: 9.1 mg/dL (ref 8.9–10.3)
Chloride: 93 mmol/L — ABNORMAL LOW (ref 98–111)
Chloride: 93 mmol/L — ABNORMAL LOW (ref 98–111)
Creatinine, Ser: 1.25 mg/dL — ABNORMAL HIGH (ref 0.61–1.24)
Creatinine, Ser: 1.1 mg/dL (ref 0.61–1.24)
GFR, Estimated: >60 mL/min (ref >60)
GFR, Estimated: >60 mL/min (ref >60)
Glucose, Bld: 146 mg/dL — ABNORMAL HIGH (ref 70–99)
Glucose, Bld: 132 mg/dL — ABNORMAL HIGH (ref 70–99)
Potassium: 3.2 mmol/L — ABNORMAL LOW (ref 3.5–5.1)
Potassium: 3.6 mmol/L (ref 3.5–5.1)
Sodium: 134 mmol/L — ABNORMAL LOW (ref 135–145)
Sodium: 136 mmol/L (ref 135–145)

## 2021-07-16 LAB — GLUCOSE, CAPILLARY
Glucose-Capillary: 119 mg/dL — ABNORMAL HIGH (ref 70–99)
Glucose-Capillary: 131 mg/dL — ABNORMAL HIGH (ref 70–99)
Glucose-Capillary: 131 mg/dL — ABNORMAL HIGH (ref 70–99)
Glucose-Capillary: 105 mg/dL — ABNORMAL HIGH (ref 70–99)

## 2021-07-16 LAB — MAGNESIUM: Magnesium: 2.1 mg/dL (ref 1.7–2.4)

## 2021-07-16 MED ORDER — POTASSIUM CHLORIDE 20 MEQ PO PACK
80.0000 meq | PACK | Freq: Three times a day (TID) | ORAL | Status: DC
  Administered 2021-07-16: 20 meq via ORAL

## 2021-07-16 MED ORDER — POTASSIUM CHLORIDE CRYS ER 20 MEQ PO TBCR
80.0000 meq | EXTENDED_RELEASE_TABLET | Freq: Three times a day (TID) | ORAL | Status: DC
  Administered 2021-07-16 – 2021-07-17 (×3): 80 meq via ORAL
  Filled 2021-07-16 (×3): qty 4

## 2021-07-16 MED ORDER — POTASSIUM CHLORIDE 20 MEQ PO PACK
60.0000 meq | PACK | Freq: Four times a day (QID) | ORAL | Status: DC
  Administered 2021-07-16: 60 meq via ORAL
  Filled 2021-07-16: qty 3

## 2021-07-16 MED ORDER — POTASSIUM CHLORIDE 20 MEQ PO PACK: 80.0000 meq | PACK | Freq: Four times a day (QID) | ORAL | Status: DC

## 2021-07-16 NOTE — Assessment & Plan Note (Addendum)
Hypokalemia. Hyponatremia. ? ?Patient had persistent hypokalemia and required aggressive K correction with KCL. ?At the time of his discharge his K is 3,4 with serum bicarbonate of 30, serum cr 1,18 and bun 13. Na 136  ? ?Plan to continue close monitoring of renal function and electrolytes as outpatient. ?Continue KCL correction.  ? ?

## 2021-07-16 NOTE — Progress Notes (Addendum)
Physical Therapy Treatment ?Patient Details ?Name: Charles Gray ?MRN: [id number] ?DOB: [birth year] ?Today's Date: 07/16/2021 ? ? ?History of Present Illness Pt is a 66 y.o. M who presents 07/07/2021 with heat failure with fluid overload.  CT scan this admit limited because of suboptimal opacification and artifact, however is suggestive of bilateral lower lobe segmental and subsegmental pulm emboli but no evidence of Rt heart strain. Follow up V/Q scan has been ordered. LE veous dopplers negative for DVT. Significant PMH: chronic diastolic heart failure, OSA, obesity, history of PE/DVT, PAF, HTN. ? ?  ?PT Comments  ? ? PTA called back into room to assist pt back from bathroom to room, pt c/o lightheadedness and was starting to stand prior to therapist proximity for assist, therapist again reinforced need for staff notification prior to pt mobility due to pt sx orthostatic hypotension. Pt will need reinforcement. Pt BP 102/64 (76) sitting, BP 117/59 (76) standing, BP 122/69 (83) sitting after ambulation from bathroom>bed. Pt likely had orthostatic episode upon standing from toilet, will continue to encourage assist for all mobility due to pt sx, RN notified. Pt needing up to minA for gait and min guard for bed mobility/transfers. Pt continues to benefit from PT services to progress toward functional mobility goals.    ?Recommendations for follow up therapy are one component of a multi-disciplinary discharge planning process, led by the attending physician.  Recommendations may be updated based on patient status, additional functional criteria and insurance authorization. ? ?Follow Up Recommendations ? Home health PT ?  ?  ?Assistance Recommended at Discharge PRN  ?Patient can return home with the following A little help with bathing/dressing/bathroom;Assistance with cooking/housework;Assist for transportation;Help with stairs or ramp for entrance;Other (comment) (PTAR transport into home) ?  ?Equipment Recommendations ?  Rolling walker (2 wheels);Other (comment) (Bariatric bilateral platform walker)  ?  ?Recommendations for Other Services   ? ? ?  ?Precautions / Restrictions Precautions ?Precautions: Fall ?Precaution Comments: orthostatic sx 4/5 ?Restrictions ?Weight Bearing Restrictions: No  ?  ? ?Mobility ? Bed Mobility ?Overal bed mobility: Needs Assistance ?Bed Mobility: Sit to Supine ?  ?  ?  ?Sit to supine: Min guard ?  ?General bed mobility comments: min guard and cues for completion of positioning and BLE elevation on pillows for comfort ?  ? ?Transfers ?Overall transfer level: Needs assistance ?Equipment used: Bilateral platform walker (bariatric) ?Transfers: Sit to/from Stand ?Sit to Stand: Min guard ?  ?  ?  ?  ?  ?General transfer comment: cues for safety, min guard with increased time due to lightheadedness/fatigue ?  ? ?Ambulation/Gait ?Ambulation/Gait assistance: Min assist ?Gait Distance (Feet): 20 Feet ?Assistive device: Rolling walker (2 wheels), Bilateral platform walker (bariatric) ?Gait Pattern/deviations: Step-through pattern, Decreased stride length, Trunk flexed, Wide base of support ?Gait velocity: decreased ?  ?  ?General Gait Details: Slow, mostly steady gait with pt utilizing RW handles instead of platforms due to difficulty sequencing with sx dizziness and novel technique with forearm platforms. Decreased foot clearance bilaterally. Pt c/o dizziness ? ? ?Stairs ?Stairs:  (defer due to pt knee pain and orthostatic sx) ?  ?  ?  ?  ? ? ? ?  ?Balance Overall balance assessment: Needs assistance ?Sitting-balance support: Feet supported, No upper extremity supported ?Sitting balance-Leahy Scale: Good ?  ?  ?Standing balance support: No upper extremity supported, During functional activity ?Standing balance-Leahy Scale: Fair ?Standing balance comment: heavily reliant on BUE support during gait ?  ?  ?  ?  ?  ?  ?  ?  ?  ?  ?  ?  ? ?  ?  Cognition Arousal/Alertness: Awake/alert ?Behavior During Therapy: [facility] for  tasks assessed/performed ?Overall Cognitive Status: Within Functional Limits for tasks assessed ?  ?  ?  ?  ?  ?  ?  ?  ?  ?  ?  ?  ?  ?  ?  ?  ?General Comments: Required repetitive cues for posture and AD use; he pushes RW better at times from regular handles although c/o more pain/fatigue this way; needs manual assist at times for device management and c/o lightheadedness, BP hypotensive seated but improved standing initially, pt still lightheaded walking back to bed and BP improved more sitting EOB ?  ?  ? ?  ?Exercises   ? ?  ?General Comments General comments (skin integrity, edema, etc.): BP 102/64 sitting in chair after c/o dizziness/lightheadedness post-toileting; BP 117/59 (76) standing at RW with arm relaxed; BP 122/69 (83) seated EOB after short gait trial, still mildly lightheaded; HR 70's-90's bpm; SpO2 97% and above on RA ?  ?  ? ?Pertinent Vitals/Pain Pain Assessment ?Pain Assessment: Faces ?Faces Pain Scale: Hurts even more ?Pain Location: B knees ?Pain Descriptors / Indicators: Grimacing, Discomfort ?Pain Intervention(s): Monitored during session, Limited activity within patient's tolerance, Repositioned  ? ? ? ?PT Goals (current goals can now be found in the care plan section) Acute Rehab PT Goals ?Patient Stated Goal: walk more, less knee pain ?PT Goal Formulation: With patient ?Time For Goal Achievement: 07/23/21 ?Progress towards PT goals: Progressing toward goals ? ?  ?Frequency ? ? ? Min 3X/week ? ? ? ?  ?PT Plan Current plan remains appropriate  ? ? ?   ?AM-PAC PT "6 Clicks" Mobility   ?Outcome Measure ? Help needed turning from your back to your side while in a flat bed without using bedrails?: None ?Help needed moving from lying on your back to sitting on the side of a flat bed without using bedrails?: A Little ?Help needed moving to and from a bed to a chair (including a wheelchair)?: A Little ?Help needed standing up from a chair using your arms (e.g., wheelchair or bedside chair)?: A  Little ?Help needed to walk in hospital room?: A Lot (mod cues for technique) ?Help needed climbing 3-5 steps with a railing? : Total ?6 Click Score: 16 ? ?  ?End of Session Equipment Utilized During Treatment: Gait belt ?Activity Tolerance: Patient limited by pain;Patient limited by fatigue;Other (comment) (B knee pain, orthostatic sx) ?Patient left: in bed;with call bell/phone within reach;with nursing/sitter in room (BLE elevated, heels floated) ?Nurse Communication: Mobility status;Other (comment) (orthostatic sx) ?PT Visit Diagnosis: Unsteadiness on feet (R26.81);Muscle weakness (generalized) (M62.81);Difficulty in walking, not elsewhere classified (R26.2) ?  ? ? ?Time: 6861-6837 ?PT Time Calculation (min) (ACUTE ONLY): 25 min ? ?Charges:   ?$Therapeutic Activity: 8-22 mins          ?          ? ?Cindie Rajagopalan P., PTA ?Acute Rehabilitation Services ?Secure Chat Preferred 9a-5:30pm ?Office: [phone]  ? ? ?Kara Pacer Theta Leaf ?07/16/2021, 6:55 PM ? ?

## 2021-07-16 NOTE — Progress Notes (Addendum)
Physical Therapy Treatment ?Patient Details ?Name: Charles Gray ?MRN: [id number] ?DOB: 1955-12-12 ?Today's Date: 07/16/2021 ? ? ?History of Present Illness Pt is a 66 y.o. M who presents 07/07/2021 with heat failure with fluid overload.  CT scan this admit limited because of suboptimal opacification and artifact, however is suggestive of bilateral lower lobe segmental and subsegmental pulm emboli but no evidence of Rt heart strain. Follow up V/Q scan has been ordered. LE veous dopplers negative for DVT. Significant PMH: chronic diastolic heart failure, OSA, obesity, history of PE/DVT, PAF, HTN. ? ?  ?PT Comments  ? ? Pt received seated EOB, agreeable to therapy session with plan for gait training using B PF bariatric rolling walker. Pt limited due to bowel urgency but able to perform short distance gait task in room ~30 ft using AD and minA with mod cues for posture and device management. Pt needing Supervision with safety cues for transfers. Pt in bathroom and aware of need to pull call bell prior to standing due to fall risk and line assist needed. Pt continues to benefit from PT services to progress toward functional mobility goals.    ?Recommendations for follow up therapy are one component of a multi-disciplinary discharge planning process, led by the attending physician.  Recommendations may be updated based on patient status, additional functional criteria and insurance authorization. ? ?Follow Up Recommendations ? Home health PT ?  ?  ?Assistance Recommended at Discharge PRN  ?Patient can return home with the following A little help with bathing/dressing/bathroom;Assistance with cooking/housework;Assist for transportation;Help with stairs or ramp for entrance;Other (comment) ([facility] transport into home) ?  ?Equipment Recommendations ? Rolling walker (2 wheels);Other (comment) (Bariatric bilateral platform walker)  ?  ?Recommendations for Other Services   ? ? ?  ?Precautions / Restrictions  Precautions ?Precautions: Fall ?Precaution Comments: orthostatic sx 4/5 ?Restrictions ?Weight Bearing Restrictions: No  ?  ? ?Mobility ? Bed Mobility ?  ?  ?General bed mobility comments: seated EOB initially and remained up on toilet ?  ? ?Transfers ?Overall transfer level: Needs assistance ?Equipment used: Bilateral platform walker (bariatric) ?Transfers: Sit to/from Stand ?Sit to Stand: Supervision ?  ?   ?General transfer comment: cues for safety ?  ? ?Ambulation/Gait ?Ambulation/Gait assistance: Min assist ?Gait Distance (Feet): 30 Feet ?Assistive device: Rolling walker (2 wheels), Bilateral platform walker (bariatric) ?Gait Pattern/deviations: Step-through pattern, Decreased stride length, Trunk flexed, Wide base of support ?Gait velocity: decreased ?  ?  ?General Gait Details: Slow, mostly steady gait with heavy BUE reliance on platform surfaces. Decreased foot clearance bilaterally. Pt c/o dizziness ? ? ? ?  ?Balance Overall balance assessment: Needs assistance ?Sitting-balance support: Feet supported, No upper extremity supported ?Sitting balance-Leahy Scale: Good ?  ?  ?Standing balance support: No upper extremity supported, During functional activity ?Standing balance-Leahy Scale: Fair ?Standing balance comment: heavily reliant on BUE support during gait ?  ?  ?  ?  ?  ?  ?  ?  ?  ?  ?  ?  ? ?  ?Cognition Arousal/Alertness: Awake/alert ?Behavior During Therapy: Flat affect ?Overall Cognitive Status: Within Functional Limits for tasks assessed ?  ?  ?  ?  ?  ?  ?  ?  ?  ?  ?  ?  ?  ?  ?  ?  ?General Comments: Required repetitive cues for posture and AD use; pt c/o confusion as to technique with platforms and pushing heavily through them; he pushes RW better at times from regular handles  although c/o more pain/fatigue this way; needs manual assist at times for device management and c/o lightheadedness prior to sitting so distance limited due to orthostatic sx; pt with bowel urgency so did check standing BP  as pt reports sx improved sitting on toilet. ?  ?  ? ?  ?Exercises   ? ?  ?General Comments   ?  ?  ? ?Pertinent Vitals/Pain Pain Assessment ?Pain Assessment: Faces ?Faces Pain Scale: Hurts a little bit ?Pain Location: B knees/back ?Pain Descriptors / Indicators: Grimacing, Discomfort ?Pain Intervention(s): Monitored during session, Repositioned, RN gave pain meds during session (RN came into room to give meds while pt sitting)  ? ? ? ?PT Goals (current goals can now be found in the care plan section) Acute Rehab PT Goals ?Patient Stated Goal: walk more ?PT Goal Formulation: With patient ?Time For Goal Achievement: 07/23/21 ? ?  ?Frequency ? ? ? Min 3X/week ? ? ? ?  ?PT Plan Current plan remains appropriate  ? ? ?   ?AM-PAC PT "6 Clicks" Mobility   ?Outcome Measure ? Help needed turning from your back to your side while in a flat bed without using bedrails?: None ?Help needed moving from lying on your back to sitting on the side of a flat bed without using bedrails?: A Little ?Help needed moving to and from a bed to a chair (including a wheelchair)?: A Little ?Help needed standing up from a chair using your arms (e.g., wheelchair or bedside chair)?: A Little ?Help needed to walk in hospital room?: A Lot (mod cues for technque) ?Help needed climbing 3-5 steps with a railing? : Total ?6 Click Score: 16 ? ?  ?End of Session Equipment Utilized During Treatment: Gait belt ?Activity Tolerance: Patient limited by pain;Patient limited by fatigue (B knee pain) ?Patient left: in bathroom/on toilet ;with call bell/phone within reach;with nursing/sitter in room;Other (comment) RN giving meds ?Nurse Communication: Mobility status;Other (comment) (case mgr notified pt needs DME for home) ?PT Visit Diagnosis: Unsteadiness on feet (R26.81);Muscle weakness (generalized) (M62.81);Difficulty in walking, not elsewhere classified (R26.2) ?  ? ? ?Time: 7622-6333 ?PT Time Calculation (min) (ACUTE ONLY): 8 min ? ?Charges:  $Gait Training:  8-22 mins          ?          ? ?Micajah Dennin P., PTA ?Acute Rehabilitation Services ?Secure Chat Preferred 9a-5:30pm ?Office: [phone]  ? ? ?Kara Pacer Kou Gucciardo ?07/16/2021, 6:43 PM ? ?

## 2021-07-16 NOTE — Progress Notes (Addendum)
Patient ID: Charles Gray, male   DOB: November 14, 1955, 66 y.o.   MRN: [id number] ?  ? ? Advanced Heart Failure Rounding Note ? ?PCP-Cardiologist: Berniece Salines, DO  ?AHFC: Dr. [name]  ? ?Subjective:   ? ?K remains low, but much improved from yesterday, up from 3.6>>3.2 today. Mg 2.1 ? ?He had brief polymorphic VT overnight, ~2:30 AM, lasting ~5 sec. Had brief bout of dyspnea overnight, which may have been related. He cannot recall the exact time this happened.  ? ?Feels tired currently. No current dyspnea. NSR on tele.  ? ? ?Objective:   ?Weight Range: ?(!) 171.7 kg ?Body mass index is 39.59 kg/m?.  ? ?Vital Signs:   ?Temp:  [98 ?F (36.7 ?C)-98.9 ?F (37.2 ?C)] 98.4 ?F (36.9 ?C) (04/05 8756) ?Pulse Rate:  [67-80] 67 (04/05 0502) ?Resp:  [16-20] 18 (04/05 0807) ?BP: (100-119)/(52-80) 100/80 (04/05 0502) ?SpO2:  [94 %-97 %] 94 % (04/05 0502) ?Weight:  [171.7 kg-172.4 kg] 171.7 kg (04/05 0502) ?Last BM Date : 07/15/21 ? ?Weight change: ?Filed Weights  ? 07/14/21 0328 07/15/21 1714 07/16/21 0502  ?Weight: (!) 170.4 kg (!) 172.4 kg (!) 171.7 kg  ? ? ?Intake/Output:  ? ?Intake/Output Summary (Last 24 hours) at 07/16/2021 0811 ?Last data filed at 07/16/2021 0600 ?Gross per 24 hour  ?Intake 1320 ml  ?Output 1500 ml  ?Net -180 ml  ?  ? ? ?Physical Exam  ? ?General:  fatigued appearing, obese No respiratory difficulty ?HEENT: normal ?Neck: supple. JVD not well visualized. Carotids 2+ bilat; no bruits. No lymphadenopathy or thyromegaly appreciated. ?Cor: PMI nondisplaced. Regular rate & rhythm. No rubs, gallops or murmurs. ?Lungs: clear ?Abdomen: obese, soft, nontender, nondistended. No hepatosplenomegaly. No bruits or masses. Good bowel sounds. ?Extremities: no cyanosis, clubbing, rash, 1+ b/l LE edema, b/l chronic venous stasis dermatitis  ?Neuro: alert & oriented x 3, cranial nerves grossly intact. moves all 4 extremities w/o difficulty. Affect pleasant. ? ?Telemetry  ? ?NSR 70s, 5 sec of PMVT overnight personally reviewed  ? ?EKG  ?   ?No new EKG to review  ? ?Labs  ?  ?CBC ?Recent Labs  ?  07/14/21 ?0000  ?WBC 11.8*  ?HGB 10.5*  ?HCT 35.0*  ?MCV 73.7*  ?PLT 173  ? ?Basic Metabolic Panel ?Recent Labs  ?  07/15/21 ?4332 07/15/21 ?1404 07/16/21 ?0120  ?NA 135  --  134*  ?K 2.6* 2.8* 3.2*  ?CL 88*  --  93*  ?CO2 35*  --  31  ?GLUCOSE 166*  --  146*  ?BUN 13  --  14  ?CREATININE 1.23  --  1.25*  ?CALCIUM 9.0  --  9.0  ?MG 2.1  --  2.1  ? ?Liver Function Tests ?No results for input(s): AST, ALT, ALKPHOS, BILITOT, PROT, ALBUMIN in the last 72 hours. ? ?No results for input(s): LIPASE, AMYLASE in the last 72 hours. ?Cardiac Enzymes ?No results for input(s): CKTOTAL, CKMB, CKMBINDEX, TROPONINI in the last 72 hours. ? ?BNP: ?BNP (last 3 results) ?Recent Labs  ?  12/21/20 ?0052 04/06/21 ?1454 07/07/21 ?2236  ?BNP 27.2 82.2 79.9  ? ? ?ProBNP (last 3 results) ?No results for input(s): PROBNP in the last 8760 hours. ? ? ?D-Dimer ?No results for input(s): DDIMER in the last 72 hours. ? ?Hemoglobin A1C ?No results for input(s): HGBA1C in the last 72 hours. ? ?Fasting Lipid Panel ?No results for input(s): CHOL, HDL, LDLCALC, TRIG, CHOLHDL, LDLDIRECT in the last 72 hours. ?Thyroid Function Tests ?No results for input(s): TSH,  T4TOTAL, T3FREE, THYROIDAB in the last 72 hours. ? ?Invalid input(s): FREET3 ? ?Other results: ? ? ?Imaging  ? ? ?No results found. ? ? ?Medications:   ? ? ?Scheduled Medications: ? apixaban  10 mg Oral BID  ? Followed by  ? [START ON 07/17/2021] apixaban  5 mg Oral BID  ? buprenorphine-naloxone  1 tablet Sublingual Daily  ? Chlorhexidine Gluconate Cloth  6 each Topical Daily  ? eplerenone  100 mg Oral Daily  ? gabapentin  800 mg Oral TID  ? insulin aspart  0-15 Units Subcutaneous TID WC  ? insulin aspart  0-5 Units Subcutaneous QHS  ? lactulose  10 g Oral TID  ? metoprolol succinate  25 mg Oral QHS  ? pantoprazole  40 mg Oral BID AC  ? polyethylene glycol  17 g Oral Daily  ? potassium chloride  60 mEq Oral Q6H  ? pravastatin  20 mg Oral QHS   ? senna-docusate  2 tablet Oral BID  ? sertraline  50 mg Oral Daily  ? sodium chloride flush  10-40 mL Intracatheter Q12H  ? tamsulosin  0.4 mg Oral Daily  ? torsemide  100 mg Oral BID  ? ? ?Infusions: ? sodium chloride 10 mL/hr at 07/12/21 0734  ? ? ?PRN Medications: ?sodium chloride, acetaminophen **OR** acetaminophen, ipratropium-albuterol, ondansetron **OR** ondansetron (ZOFRAN) IV, polyethylene glycol, sodium chloride, sodium chloride flush, traMADol, zolpidem ? ? ? ?Patient Profile  ? ?66 y/o male w/ chronic diastolic heart failure w/ suspected RV failure, OSA, obesity, h/o PE/DVT, PAF, HTN and h/o poor compliance, admitted w/ a/c heart failure w/ fluid overload.  ? ?Assessment/Plan  ? ? ?1. Acute on Chronic Diastolic CHF with suspected RV dysfunction: Echo 7/22 with EF 60-65%, mild LVH, normal RV, unable to estimate PA systolic pressure. Multiple CHF admissions despite aggressive outpatient diuretic regimen and paramedicine.  RHC (7/22) with optimized filling pressures, PCWP 13, CO/CI 7.3/2.4. Received IV lasix 80 mg through paramedicine 11/12/20. RHC (8/22) with normal filling pressures; underwent placement of Cardiomems (dPAP goal 10 mmHg). Now admitted w/ marked volume overload and slight progression to NYHA Class lllb symptoms.  Suspect poor compliance w/ home diuretic regimen. Most recent Cardiomems transmissions have shown dPAP to be at or below goal, but exam more suggestive of R>L sided heart failure. BNP only 79 but may be falsely low in setting of morbid obesity. Echo EF 60-65% RV normal this admission.   ?- He diuresed well, weight down total of 20 lb. He still has a lot of peripheral edema but think that may be more due to chronic venous insufficiency at this point than CHF.  ?- Continue torsemide 100 mg BID + 2.5 mg metolazone M, W, F.  ?- Aggressive K supplementation today, repeat BMET in pm. When he goes home, should go back to home K regimen.   ?- Replace Unna boots. Reports he was having  UNNA boots placed at home, will check with CSW to see if this will be resumed at discharge. ?- Continue eplerenone 100 mg daily  ?- Off Jardiance (GI upset). ?- Continue Toprol XL 25 mg daily  ?- At d/c will need to lower cardiomems goal.  ?2. H/o PE/DVT: Suspect due to sedentary lifestyle.  Occurred in 2/21.  V/Q scan in 6/21 did not show evidence for chronic PE.  CT scan this admit limited because of suboptimal opacification and artifact, however is suggestive of bilateral lower lobe segmental and subsegmental pulm emboli but no evidence of Rt heart strain  but not certain of diagnosis due to quality of scan. Unable to get V/Q scan this admission due to size. LE venous dopplers negative for DVT ?- Discussed with [facility], would continue Eliquis for now, make sure he is fully compliant.  ?3. OSA: Cannot tolerate CPAP. ?4. Anxiety/depression: This is a major issue.   ?- Continue sertraline 50 mg daily.  ?5. CAD: Cath 1/22 with no significant CAD. No ischemic CP. HS trop this admit negative x 2. No chest pain.  ?- on statin + ? blocker.  ?6. Hypertension: Stable. ?- Continue Toprol XL 25 mg bid.   ?- Switch to eplerenone 100 mg daily today  ?7. Atrial fibrillation: Paroxysmal. In SR today.  ?- Continue apixaban. No bleeding issues. ?- Continue Toprol XL. ?8. Bilateral Lower extremity erythema: Admitted 5/22 with possible BLE cellulitis. Since then, he has been on multiple rounds of Keflex, Levaquin and doxycycline over the last several months. Previously discussed with Janene Madeira, NP with ID, and recommended starting cefadroxil 1000 mg bid x 7 days, however he took this for 2 days and stopped due to GI upset. Compliance has remained a barrier. He continues w/ b/l LE erythema on exam but suspect most likely due to chronic venous stasis/ dependent rubor + CHF. Doubt cellulitis. WBC normal.  ?- c/w unna boots ?9. Cirrhosis: CT scan noted  nodular contour of liver, compatible with underlying cirrhosis. In setting of  known RV dysfunction. LFTs ok. EGD 12/22 showed Portal hypertensive gastropathy. No varices. NH3 mildly elevated, 38 w/ mild confusion on admit.  ?- Lactulose ordered by primary team (also takes this for ch

## 2021-07-16 NOTE — Progress Notes (Signed)
?Progress Note ? ? ?Patient: Charles Gray UEK:[id number] DOB: September 15, 1955 DOA: 07/07/2021     3 ?DOS: the patient was seen and examined on 07/16/2021 ?  ?Brief hospital course: ?[name] was admitted to the hospital with the working diagnosis of decompensated heart failure.  ? ?12yom PMH including diastolic CHF, paroxysmal atrial fibrillation, DVT, PE, cirrhosis, COPD and obesity who presented with bilateral lower extremity edema, dyspnea, was set for discharge from ED but had CP.  On his initial physical examination his blood pressure was 123/75, HR 74, RR 14 and 02 saturation 97%, lungs with scattered rhonchi bilaterally, heart with S1 and S2 present and rhythmic, abdomen not distended and positive lower extremity edema.  ? ?Na 138, K 3,4 Cl 98, bicarbonate 30 glucose 120 bun 10 cr 1.1 ?Wbc 9,3 hgb 10,1 hct 34.4 plt 179  ?Sars [id number] negative. ? ?EKG 108 bpm, left axis deviation, left anterior fascicular block, sinus rhythm with poor R wave progression, no significant ST segment or T wave changes.  ? ?Chest radiograph with hilar vascular congestion.  ?CT chest with suspected bilateral lower lobe segmental and subsegmental pulmonary embolism.  ? ?Patient was placed on furosemide for diuresis.  ? ?Unna boots applied, PICC line placed to follow CVP.  Diuresed.  Volume status now stable. After discussion with cardiology, recommendation was to continue apixaban and not repeat CTA.  Could not obtain V/Q.  Hospitalization prolonged by persistent hypokalemia which is preventing discharge. ? ? ? ?Assessment and Plan: ?* Acute on chronic diastolic CHF (congestive heart failure) ([place]) ?Volume status has been improving, his urine output over last 24 hrs is 1,600. ?Net fluid balance since admission is negative 14,222 ml ?Systolic blood pressure is 99 to 107 mmHg. ? ?Plan to continue diuresis with torsemide.  ?Continue with metoprolol for b blockade and continue with eplrenone.  ? ? ?AKI (acute kidney injury)  ([place]) ?Hypokalemia. Hyponatremia. ? ?Renal function with serum cr at 1,25 with K at 3,2 and serum Na at 134 with bicarbonate at 31. ?Plan to continue diuresis with torsemide and eplrenone.  ?Continue K supplementation. ?Follow up renal function and electrolytes in am. ?Patient is tolerating po well.  ? ? ?Paroxysmal atrial fibrillation ([facility]) ?Continue metoprolol for rate control and anticoagulation with apixaban.  ? ? ?Acute pulmonary embolism ([place]) ?Plan to continue anticoagulation with apixaban. ?Lower extremity US negative for DVT.   ? ?Lymphedema ?-- Continue Unna boots, follow-up suggested with lymphedema clinic. Continue HH twice weekly changes. ? ?COPD with acute exacerbation ([place]) ?-- Resolved.  Continue bronchodilators. ? ? ?Iron deficiency anemia ?SP IV iron, follow iron panel as outpatient.  ? ?Essential hypertension ?Continue blood pressure control with metoprolol and diuresis with eplrenone and torsemide  ? ? ?GERD without esophagitis ?-- PPI ? ?Cirrhosis of liver without ascites ([facility]) ?--CT scan noted  nodular contour of liver, compatible with underlying cirrhosis. In setting of known RV dysfunction. LFTs ok. EGD 12/22 showed Portal hypertensive gastropathy. No varices. NH3 mildly elevated, 38 w/ mild confusion on admit.  ?--continue lactulose ?--follow-up as an outpatient ? ? ? ? ?  ? ?Subjective: Patient with improvement in dyspnea and lower extremity edema ?N ? ?Physical Exam: ?Vitals:  ? 07/16/21 0502 07/16/21 0807 07/16/21 1145 07/16/21 1609  ?BP: 100/80 (!) 99/57 139/67 107/61  ?Pulse: 67 67 77 67  ?Resp: 18 18 18 18   ?Temp: 98.5 ?F (36.9 ?C) 98.4 ?F (36.9 ?C) 97.6 ?F (36.4 ?C) 98 ?F (36.7 ?C)  ?TempSrc: Oral Oral Oral Oral  ?SpO2:  94% 92% 99% 99%  ?Weight: (!) 171.7 kg     ?Height:      ? ?Neurology awake and alert ?ENT with no pallor ?Cardiovascular with S1 and S2 present and rhythmic ?No JVD ?Positive lower extremity edema trace, unna boots in place ?Respiratory with no wheezing ?Abdomen not  distended  ?Data Reviewed: ? ? ? ?Family Communication: no family at the bedside  ? ?Disposition: ?Status is: Inpatient ?Remains inpatient appropriate because: pending K correction  ? Planned Discharge Destination: Home ? ?Author: ?Tawni Millers, MD ?07/16/2021 4:30 PM ? ?For on call review [email or web address].  ?

## 2021-07-17 ENCOUNTER — Other Ambulatory Visit (HOSPITAL_COMMUNITY): Payer: Self-pay

## 2021-07-17 ENCOUNTER — Telehealth (HOSPITAL_COMMUNITY): Payer: Self-pay

## 2021-07-17 DIAGNOSIS — I5033 Acute on chronic diastolic (congestive) heart failure: Secondary | ICD-10-CM | POA: Diagnosis not present

## 2021-07-17 DIAGNOSIS — D509 Iron deficiency anemia, unspecified: Secondary | ICD-10-CM | POA: Diagnosis not present

## 2021-07-17 DIAGNOSIS — J441 Chronic obstructive pulmonary disease with (acute) exacerbation: Secondary | ICD-10-CM

## 2021-07-17 DIAGNOSIS — I2699 Other pulmonary embolism without acute cor pulmonale: Secondary | ICD-10-CM | POA: Diagnosis not present

## 2021-07-17 DIAGNOSIS — I10 Essential (primary) hypertension: Secondary | ICD-10-CM | POA: Diagnosis not present

## 2021-07-17 DIAGNOSIS — N179 Acute kidney failure, unspecified: Secondary | ICD-10-CM | POA: Diagnosis not present

## 2021-07-17 DIAGNOSIS — I890 Lymphedema, not elsewhere classified: Secondary | ICD-10-CM | POA: Diagnosis not present

## 2021-07-17 DIAGNOSIS — K219 Gastro-esophageal reflux disease without esophagitis: Secondary | ICD-10-CM | POA: Diagnosis not present

## 2021-07-17 DIAGNOSIS — E785 Hyperlipidemia, unspecified: Secondary | ICD-10-CM | POA: Diagnosis present

## 2021-07-17 DIAGNOSIS — K7460 Unspecified cirrhosis of liver: Secondary | ICD-10-CM | POA: Diagnosis not present

## 2021-07-17 LAB — MAGNESIUM: Magnesium: 2.1 mg/dL (ref 1.7–2.4)

## 2021-07-17 LAB — BASIC METABOLIC PANEL
Anion gap: 7 (ref 5–15)
BUN: 13 mg/dL (ref 8–23)
CO2: 30 mmol/L (ref 22–32)
Calcium: 8.8 mg/dL — ABNORMAL LOW (ref 8.9–10.3)
Chloride: 99 mmol/L (ref 98–111)
Creatinine, Ser: 1.18 mg/dL (ref 0.61–1.24)
GFR, Estimated: >60 mL/min (ref >60)
Glucose, Bld: 147 mg/dL — ABNORMAL HIGH (ref 70–99)
Potassium: 3.4 mmol/L — ABNORMAL LOW (ref 3.5–5.1)
Sodium: 136 mmol/L (ref 135–145)

## 2021-07-17 LAB — GLUCOSE, CAPILLARY
Glucose-Capillary: 151 mg/dL — ABNORMAL HIGH (ref 70–99)
Glucose-Capillary: 117 mg/dL — ABNORMAL HIGH (ref 70–99)

## 2021-07-17 MED ORDER — POTASSIUM CHLORIDE CRYS ER 20 MEQ PO TBCR
100.0000 meq | EXTENDED_RELEASE_TABLET | Freq: Three times a day (TID) | ORAL | 0 refills | Status: DC
Start: 2021-07-17 — End: 2021-08-06
  Filled 2021-07-17: qty 450, 30d supply, fill #0

## 2021-07-17 MED ORDER — APIXABAN 5 MG PO TABS
5.0000 mg | ORAL_TABLET | Freq: Two times a day (BID) | ORAL | 0 refills | Status: DC
  Filled 2021-07-17: qty 60, 30d supply, fill #0

## 2021-07-17 MED ORDER — LACTULOSE 10 GM/15ML PO SOLN: 20.0000 g | Freq: Three times a day (TID) | ORAL | Status: DC

## 2021-07-17 MED ORDER — EPLERENONE 25 MG PO TABS
100.0000 mg | ORAL_TABLET | Freq: Every day | ORAL | 0 refills | Status: DC
  Filled 2021-07-17: qty 120, 30d supply, fill #0
  Filled 2021-07-17: qty 60, 30d supply, fill #0

## 2021-07-17 MED ORDER — POTASSIUM CHLORIDE CRYS ER 20 MEQ PO TBCR
40.0000 meq | EXTENDED_RELEASE_TABLET | Freq: Once | ORAL | Status: AC
  Administered 2021-07-17: 40 meq via ORAL
  Filled 2021-07-17: qty 2

## 2021-07-17 MED ORDER — LACTULOSE ENCEPHALOPATHY 10 GM/15ML PO SOLN
20.0000 g | Freq: Three times a day (TID) | ORAL | 0 refills | Status: DC
Start: 2021-07-17 — End: 2022-03-11
  Filled 2021-07-17: qty 236, 3d supply, fill #0

## 2021-07-17 MED ORDER — METOLAZONE 5 MG PO TABS: 2.5000 mg | ORAL_TABLET | ORAL | Status: DC

## 2021-07-17 NOTE — TOC CM/SW Note (Signed)
HF TOC CM contacted [facility] for transportation home. Contacted [facility] rep, Marjory Lies for resumption of HH with BMP lab draw needed next week and UNNA boots dressing changes. Pt got a Rollator in August and unable to get RW with platform unless he can cover out of pocket. Jonnie Finner RN3 CCM, Heart Failure TOC CM [phone]  ?

## 2021-07-17 NOTE — Telephone Encounter (Signed)
Spoke to [employer] who reports he is getting discharged home this evening and will be transported by [facility].  ? ?He only has one day of medications in his pill box at home and will need med changes updated. I plan to go out tomorrow to see him in the home. He agreed with plans. He knows to call [facility] transportation to set up ride for next weeks clinic visit. Time, address and appointment notes texted to him to complete same.  ? ?Home visit to follow tomorrow. ?

## 2021-07-17 NOTE — Progress Notes (Signed)
Physical Therapy Treatment ?Patient Details ?Name: Charles Gray ?MRN: [id number] ?DOB: [birth year] ?Today's Date: 07/17/2021 ? ? ?History of Present Illness Pt is a 66 y.o. M who presents 07/07/2021 with heat failure with fluid overload.  CT scan this admit limited because of suboptimal opacification and artifact, however is suggestive of bilateral lower lobe segmental and subsegmental pulm emboli but no evidence of Rt heart strain. Follow up V/Q scan has been ordered. LE veous dopplers negative for DVT. Significant PMH: chronic diastolic heart failure, OSA, obesity, history of PE/DVT, PAF, HTN. ? ?  ?PT Comments  ? ? Pt received seated EOB, deferring OOB mobility due to recently working with mobility specialist and pt c/o increased L knee pain (since "past few days" per pt) but agreeable to instruction on HEP handout per goals. Pt receptive to instruction, HEP handout given (link: [email or web address]  ?Access Code: [email or web address]). Pt continues to benefit from PT services to progress toward functional mobility goals. Pt reports he plans to have [facility] transport into home so he will not have to perform stairs, pt unable to perform stairs independently or with assist today due to significant L knee pain.  ?Recommendations for follow up therapy are one component of a multi-disciplinary discharge planning process, led by the attending physician.  Recommendations may be updated based on patient status, additional functional criteria and insurance authorization. ? ?Follow Up Recommendations ? Home health PT ?  ?  ?Assistance Recommended at Discharge PRN  ?Patient can return home with the following A little help with bathing/dressing/bathroom;Assistance with cooking/housework;Assist for transportation;Help with stairs or ramp for entrance;Other (comment);A little help with walking and/or transfers ([facility] transport into home) ?  ?Equipment Recommendations ? Rolling walker (2 wheels);Other (comment) (Bariatric bilateral platform  walker)  ?  ?Recommendations for Other Services   ? ? ?  ?Precautions / Restrictions Precautions ?Precautions: Fall ?Precaution Comments: orthostatic sx 4/5 ?Restrictions ?Weight Bearing Restrictions: No  ?  ? ?Mobility ? Bed Mobility ?  ?  ?  ?  ?  ?  ?  ?General bed mobility comments: pt defers OOB mobility ?  ? ?Transfers ?  ?  ?  ?  ?  ?  ?  ?  ?  ?General transfer comment: verbal review for safe technique, pt did not perform per his request just worked with mobility specialist a few mins prior ?  ? ? ? ? ?  ?Balance   ?Sitting-balance support: Feet supported, No upper extremity supported ?Sitting balance-Leahy Scale: Normal ?Sitting balance - Comments: sitting EOB to eat and with lateral leans, no LOB ?  ?  ?  ?  ?  ?  ? ?  ?Cognition Arousal/Alertness: Awake/alert ?Behavior During Therapy: [facility] for tasks assessed/performed ?Overall Cognitive Status: Within Functional Limits for tasks assessed ?  ?  ?  ?  ?  ?  ?  ?General Comments: pt defers OOB mobility (sitting EOB eating lunch) but agreeable to instruction/review of HEP while he is sitting up to eat prior to DC; pt expressed frustration regarding his diet options, RN in room during session to address issue and called dietary to clarify and get him more options. ?  ?  ? ?  ?Exercises Other Exercises ?Other Exercises: verbal/visual demo given for pt of supine/seated LE exercises and instruction on transfers/gait using B PF bari RW for pt improved upright posture, handout including instruction with photo/explanation and web link for video if desired. Pt receptive ?Other Exercises: HEP: [email or web address]   Access Code: [phone] ? ?  ?  General Comments General comments (skin integrity, edema, etc.): VSS on RA at rest/seated EOB ?  ?  ? ?Pertinent Vitals/Pain Pain Assessment ?Pain Assessment: Faces ?Faces Pain Scale: Hurts little more ?Pain Location: L knee>R knee ?Pain Descriptors / Indicators: Grimacing, Discomfort ?Pain Intervention(s): Monitored during  session  ? ? ? ?PT Goals (current goals can now be found in the care plan section) Acute Rehab PT Goals ?Patient Stated Goal: walk more, less knee pain ?PT Goal Formulation: With patient ?Time For Goal Achievement: 07/23/21 ?Progress towards PT goals: Progressing toward goals ? ?  ?Frequency ? ? ? Min 3X/week ? ? ? ?  ?PT Plan Current plan remains appropriate  ? ? ?   ?AM-PAC PT "6 Clicks" Mobility   ?Outcome Measure ? Help needed turning from your back to your side while in a flat bed without using bedrails?: None ?Help needed moving from lying on your back to sitting on the side of a flat bed without using bedrails?: A Little ?Help needed moving to and from a bed to a chair (including a wheelchair)?: A Little ?Help needed standing up from a chair using your arms (e.g., wheelchair or bedside chair)?: A Little ?Help needed to walk in hospital room?: A Lot (mod cues for technique) ?Help needed climbing 3-5 steps with a railing? : Total ?6 Click Score: 16 ? ?  ?End of Session   ?Activity Tolerance: Other (comment);Patient limited by fatigue (just finished working with mobility specialist, pt wanting to eat) ?Patient left: with call bell/phone within reach;in bed (seated EOB to eat) ?Nurse Communication: Mobility status ?PT Visit Diagnosis: Unsteadiness on feet (R26.81);Muscle weakness (generalized) (M62.81);Difficulty in walking, not elsewhere classified (R26.2) ?  ? ? ?Time: 1215-1224 ?PT Time Calculation (min) (ACUTE ONLY): 9 min ? ?Charges:  $Therapeutic Exercise: 8-22 mins          ?          ? ?Dotti Busey P., PTA ?Acute Rehabilitation Services ?Secure Chat Preferred 9a-5:30pm ?Office: [phone]  ? ? ?Kara Pacer Saketh Daubert ?07/17/2021, 2:19 PM ? ?

## 2021-07-17 NOTE — Discharge Summary (Addendum)
?Physician Discharge Summary ?  ?Patient: Charles Gray MRN: [id number] DOB: 1955/04/20  ?Admit date:     07/07/2021  ?Discharge date: 07/17/21  ?Discharge Physician: Jimmy Picket Kayvon Mo  ? ?PCP: Enid Skeens., MD  ? ?Recommendations at discharge:  ? ? Patient is being discharge on high doses of loop diuretic along with three times per week metolazone and daily eplernone.  ?Lactulose increased to 30 ml tid to target 2 to 3 bowel movements per day. ?KCL supplementation to prevent hypokalemia. ?Follow up renal function and electrolytes as outpatient.  ? ?Discharge Diagnoses: ?Principal Problem: ?  Acute on chronic diastolic CHF (congestive heart failure) ([place]) ?Active Problems: ?  AKI (acute kidney injury) ([place]) ?  Acute pulmonary embolism ([place]) ?  Paroxysmal atrial fibrillation (HCC) ?  COPD with acute exacerbation ([place]) ?  Lymphedema ?  Essential hypertension ?  Iron deficiency anemia ?  GERD without esophagitis ?  Cirrhosis of liver without ascites ([place]) ? ?Resolved Problems: ?  * No resolved hospital problems. * ? ?Hospital Course: ?Charles Gray was admitted to the hospital with the working diagnosis of decompensated heart failure.  ? ?66 yo male with the past medical history including diastolic CHF, paroxysmal atrial fibrillation, DVT, PE, cirrhosis, COPD and obesity who presented with bilateral lower extremity edema, dyspnea, and chest pain.  On his initial physical examination his blood pressure was 123/75, HR 74, RR 14 and 02 saturation 97%, lungs with scattered rhonchi bilaterally, heart with S1 and S2 present and rhythmic, abdomen not distended and positive lower extremity edema.  ? ?Na 138, K 3,4 Cl 98, bicarbonate 30 glucose 120 bun 10 cr 1.1 ?Wbc 9,3 hgb 10,1 hct 34.4 plt 179  ?Sars 236 [id number] negative. ? ?EKG 108 bpm, left axis deviation, left anterior fascicular block, sinus rhythm with poor R wave progression, no significant ST segment or T wave changes.  ? ?Chest radiograph with hilar vascular  congestion.  ?CT chest with suspected bilateral lower lobe segmental and subsegmental pulmonary embolism.  ? ?Patient was placed on furosemide for diuresis.  ? ?Unna boots applied, PICC line placed to follow CVP.   ?Improved volume status.  ? ?After discussion with cardiology, recommendation was to continue apixaban and not repeat CTA.  Could not obtain V/Q.  Hospitalization prolonged by persistent hypokalemia.  ? ?Patient will follow up as outpatient next week, check renal function and electrolytes.  ? ?Assessment and Plan: ?* Acute on chronic diastolic CHF (congestive heart failure) ([place]) ?Patient was admitted to the cardiac ward and underwent aggressive diuresis with IV furosemide.  ?Negative fluid balance was achieved, -15,107 ml, since admission with significant improvement in his symptoms.  ?  ?Echocardiogram with preserved LV systolic function EF 60 to 81%, RV systolic function preserved, no significant valvular disease.  ? ?Patient will continue diuresis with torsemide, eplrenone, and three time per week metolazone.  ?Close follow up on renal function and electrolytes.  ?Continue with unna boots and home health services.  ?Continue with b blockade with metoprolol 25 mg succinate.  ? ?AKI (acute kidney injury) ([place]) ?Hypokalemia. Hyponatremia. ? ?Patient had persistent hypokalemia and required aggressive K correction with KCL. ?At the time of his discharge his K is 3,4 with serum bicarbonate of 30, serum cr 1,18 and bun 13. Na 136  ? ?Plan to continue close monitoring of renal function and electrolytes as outpatient. ?Continue KCL correction.  ? ? ?Paroxysmal atrial fibrillation (HCC) ?Continue metoprolol for rate control and anticoagulation with apixaban.  ? ? ?Acute pulmonary embolism (  [place]) ?Plan to continue anticoagulation with apixaban. ?Lower extremity US negative for DVT.   ? ?Lymphedema ?Continue Unna boots, follow-up suggested with lymphedema clinic. Continue HH twice weekly changes. ? ?COPD with  acute exacerbation ([place]) ?COPD exacerbation resolved, patient will continue with bronchodilator therapy at home.  ? ? ?Iron deficiency anemia ?SP IV iron, follow iron panel as outpatient.  ? ?Essential hypertension ?Continue blood pressure control with metoprolol and diuresis with eplrenone and torsemide  ? ? ?GERD without esophagitis ?Continue antiacid therapy with proton pump inhibitors.  ? ?Dyslipidemia ?Continue with statin therapy.  ? ?Cirrhosis of liver without ascites (HCC) ?--CT scan noted  nodular contour of liver, compatible with underlying cirrhosis. In setting of known RV dysfunction. LFTs ok. EGD 12/22 showed Portal hypertensive gastropathy. No varices. ? ?Lactulose has been increased to 30 ml tid, to target 2 to 3 bowel movements per day.  ? ? ? ? ?  ? ? ?Consultants: cardiology  ?Procedures performed: none   ?Disposition: Home ?Diet recommendation:  ?Cardiac diet ?DISCHARGE MEDICATION: ?Allergies as of 07/17/2021   ? ?   Reactions  ? Penicillins Other (See Comments)  ? Caused convulsions as a child  ?Did it involve swelling of the face/tongue/throat, SOB, or low BP? N/A ?Did it involve sudden or severe rash/hives, skin peeling, or any reaction on the inside of your mouth or nose? N/A ?Did you need to seek medical attention at a hospital or doctor's office?N/A ?When did it last happen? N/A ?If all above answers are "NO", may proceed with cephalosporin use.  ? ?  ? ?  ?Medication List  ?  ? ?STOP taking these medications   ? ?triamcinolone 0.025 % ointment ?Commonly known as: KENALOG ?  ? ?  ? ?TAKE these medications   ? ?apixaban 5 MG Tabs tablet ?Commonly known as: Eliquis ?Take 1 tablet (5 mg total) by mouth 2 (two) times daily. ?  ?Buprenorphine HCl-Naloxone HCl 8-2 MG Film ?Place 1 tablet under the tongue 2 (two) times daily. ?  ?doxycycline 100 MG capsule ?Commonly known as: VIBRAMYCIN ?Take 100 mg by mouth 2 (two) times daily. 10 day course. Pt started 1st dose yesterday ?  ?eplerenone 25 MG  tablet ?Commonly known as: INSPRA ?Take 4 tablets (100 mg total) by mouth daily. ?Start taking on: July 18, 2021 ?What changed: medication strength ?  ?gabapentin 800 MG tablet ?Commonly known as: NEURONTIN ?Take 1 tablet (800 mg total) by mouth 3 (three) times daily. ?  ?lactulose (encephalopathy) 10 GM/15ML Soln ?Commonly known as: [place] ?Take 30 mLs (20 g total) by mouth 3 (three) times daily. ?What changed: how much to take ?  ?metolazone 2.5 MG tablet ?Commonly known as: ZAROXOLYN ?Take 1 tablet (2.5 mg total) by mouth 3 (three) times a week. Monday, Wednesday and Fridays ?What changed:  ?when to take this ?additional instructions ?  ?metoprolol succinate 25 MG 24 hr tablet ?Commonly known as: Toprol XL ?Take 1 tablet (25 mg total) by mouth at bedtime. ?  ?pantoprazole 40 MG tablet ?Commonly known as: PROTONIX ?Take 40 mg by mouth 2 (two) times daily before a meal. ?  ?polyethylene glycol 17 g packet ?Commonly known as: MIRALAX / GLYCOLAX ?Take 17 g by mouth as needed for mild constipation (mix and drink as directed). ?  ?potassium chloride SA 20 MEQ tablet ?Commonly known as: KLOR-CON M ?Take 5 tablets (100 mEq total) by mouth 3 (three) times daily. Take one tablet extra (40 meq) with metolazone. ?What changed: See the  new instructions. ?  ?pravastatin 20 MG tablet ?Commonly known as: PRAVACHOL ?Take 1 tablet (20 mg total) by mouth daily. ?What changed: when to take this ?  ?sertraline 50 MG tablet ?Commonly known as: ZOLOFT ?TAKE 1 TABLET BY MOUTH DAILY ?  ?tamsulosin 0.4 MG Caps capsule ?Commonly known as: FLOMAX ?Take 1 capsule (0.4 mg total) by mouth daily. ?  ?torsemide 100 MG tablet ?Commonly known as: DEMADEX ?Take 1 tablet (100 mg total) by mouth twice daily. ?What changed:  ?medication strength ?additional instructions ?  ?Ventolin HFA 108 (90 Base) MCG/ACT inhaler ?Generic drug: albuterol ?Inhale 1 puff into the lungs every 4 (four) hours as needed for shortness of breath. ?What changed: how much  to take ?  ?zolpidem 10 MG tablet ?Commonly known as: AMBIEN ?Take 1 tablet (10 mg total) by mouth at bedtime as needed for sleep. ?What changed: when to take this ?  ? ?  ? ?  ?  ? ? ?  ?Durable Medical Equipment

## 2021-07-17 NOTE — Care Management Important Message (Signed)
Important Message ? ?Patient Details  ?Name: Charles Gray ?MRN: [id number] ?Date of Birth: 03-10-1956 ? ? ?Medicare Important Message Given:  Yes ? ? ? ? ?Shelda Altes ?07/17/2021, 9:00 AM ?

## 2021-07-17 NOTE — Final Progress Note (Signed)
Patient discharged to [facility] transport to destination home. Patient has within normal limits vital signs, no pain complaints, alert oriented and conversing with [facility] transport personnel.  ?This concludes 07/07/21 admission. ?

## 2021-07-17 NOTE — Progress Notes (Addendum)
Patient ID: Charles Gray, male   DOB: [birth year], 66 y.o.   MRN: [id number] ?  ? ? Advanced Heart Failure Rounding Note ? ?PCP-Cardiologist: Berniece Salines, DO  ?AHFC: Dr. [name]  ? ?Subjective:   ? ?K better, from 2.6>>3.4 today. Mg 2.1  ? ?No further NSVT  ? ?Wt stable, legs still edematous. Wearing unna boots. Feeling better.  ? ? ?Objective:   ?Weight Range: ?(!) 171.7 kg ?Body mass index is 39.59 kg/m?.  ? ?Vital Signs:   ?Temp:  [97.6 ?F (36.4 ?C)-98.4 ?F (36.9 ?C)] 98.4 ?F (36.9 ?C) (04/06 0424) ?Pulse Rate:  [67-77] 68 (04/06 0424) ?Resp:  [16-20] 16 (04/06 0424) ?BP: (99-139)/(57-78) 102/78 (04/06 0424) ?SpO2:  [92 %-99 %] 93 % (04/06 0424) ?Last BM Date : 07/16/21 ? ?Weight change: ?Filed Weights  ? 07/14/21 0328 07/15/21 1714 07/16/21 0502  ?Weight: (!) 170.4 kg (!) 172.4 kg (!) 171.7 kg  ? ? ?Intake/Output:  ? ?Intake/Output Summary (Last 24 hours) at 07/17/2021 0732 ?Last data filed at 07/16/2021 2300 ?Gross per 24 hour  ?Intake 1200 ml  ?Output 1600 ml  ?Net -400 ml  ?  ? ? ?Physical Exam  ? ?General:  well appearing, obese, sitting on side of bed No respiratory difficulty ?HEENT: normal ?Neck: supple. JVD not well visualized. Carotids 2+ bilat; no bruits. No lymphadenopathy or thyromegaly appreciated. ?Cor: PMI nondisplaced. Regular rate & rhythm. No rubs, gallops or murmurs. ?Lungs: clear ?Abdomen: obese, soft, nontender, nondistended. No hepatosplenomegaly. No bruits or masses. Good bowel sounds. ?Extremities: no cyanosis, clubbing, rash, 2+ b/l LE edema +unna boots ?Neuro: alert & oriented x 3, cranial nerves grossly intact. moves all 4 extremities w/o difficulty. Affect pleasant. ? ?Telemetry  ? ?NSR 70s, no further NSVT personally reviewed  ? ?EKG  ?  ?No new EKG to review  ? ?Labs  ?  ?CBC ?No results for input(s): WBC, NEUTROABS, HGB, HCT, MCV, PLT in the last 72 hours. ? ?Basic Metabolic Panel ?Recent Labs  ?  07/16/21 ?0120 07/16/21 ?1619 07/17/21 ?0604  ?NA 134* 136 136  ?K 3.2* 3.6 3.4*  ?CL 93*  93* 99  ?CO2 31 33* 30  ?GLUCOSE 146* 132* 147*  ?BUN 14 14 13   ?CREATININE 1.25* 1.10 1.18  ?CALCIUM 9.0 9.1 8.8*  ?MG 2.1  --  2.1  ? ?Liver Function Tests ?No results for input(s): AST, ALT, ALKPHOS, BILITOT, PROT, ALBUMIN in the last 72 hours. ? ?No results for input(s): LIPASE, AMYLASE in the last 72 hours. ?Cardiac Enzymes ?No results for input(s): CKTOTAL, CKMB, CKMBINDEX, TROPONINI in the last 72 hours. ? ?BNP: ?BNP (last 3 results) ?Recent Labs  ?  12/21/20 ?0052 04/06/21 ?1454 07/07/21 ?2236  ?BNP 27.2 82.2 79.9  ? ? ?ProBNP (last 3 results) ?No results for input(s): PROBNP in the last 8760 hours. ? ? ?D-Dimer ?No results for input(s): DDIMER in the last 72 hours. ? ?Hemoglobin A1C ?No results for input(s): HGBA1C in the last 72 hours. ? ?Fasting Lipid Panel ?No results for input(s): CHOL, HDL, LDLCALC, TRIG, CHOLHDL, LDLDIRECT in the last 72 hours. ?Thyroid Function Tests ?No results for input(s): TSH, T4TOTAL, T3FREE, THYROIDAB in the last 72 hours. ? ?Invalid input(s): FREET3 ? ?Other results: ? ? ?Imaging  ? ? ?No results found. ? ? ?Medications:   ? ? ?Scheduled Medications: ? apixaban  10 mg Oral BID  ? Followed by  ? apixaban  5 mg Oral BID  ? buprenorphine-naloxone  1 tablet Sublingual Daily  ? Chlorhexidine Gluconate  Cloth  6 each Topical Daily  ? eplerenone  100 mg Oral Daily  ? gabapentin  800 mg Oral TID  ? insulin aspart  0-15 Units Subcutaneous TID WC  ? insulin aspart  0-5 Units Subcutaneous QHS  ? lactulose  10 g Oral TID  ? metoprolol succinate  25 mg Oral QHS  ? pantoprazole  40 mg Oral BID AC  ? polyethylene glycol  17 g Oral Daily  ? potassium chloride  80 mEq Oral TID  ? pravastatin  20 mg Oral QHS  ? senna-docusate  2 tablet Oral BID  ? sertraline  50 mg Oral Daily  ? sodium chloride flush  10-40 mL Intracatheter Q12H  ? tamsulosin  0.4 mg Oral Daily  ? torsemide  100 mg Oral BID  ? ? ?Infusions: ? sodium chloride 10 mL/hr at 07/12/21 0734  ? ? ?PRN Medications: ?sodium chloride,  acetaminophen **OR** acetaminophen, ipratropium-albuterol, ondansetron **OR** ondansetron (ZOFRAN) IV, polyethylene glycol, sodium chloride, sodium chloride flush, traMADol, zolpidem ? ? ? ?Patient Profile  ? ?66 y/o male w/ chronic diastolic heart failure w/ suspected RV failure, OSA, obesity, h/o PE/DVT, PAF, HTN and h/o poor compliance, admitted w/ a/c heart failure w/ fluid overload.  ? ?Assessment/Plan  ? ? ?1. Acute on Chronic Diastolic CHF with suspected RV dysfunction: Echo 7/22 with EF 60-65%, mild LVH, normal RV, unable to estimate PA systolic pressure. Multiple CHF admissions despite aggressive outpatient diuretic regimen and paramedicine.  RHC (7/22) with optimized filling pressures, PCWP 13, CO/CI 7.3/2.4. Received IV lasix 80 mg through paramedicine 11/12/20. RHC (8/22) with normal filling pressures; underwent placement of Cardiomems (dPAP goal 10 mmHg). Now admitted w/ marked volume overload and slight progression to NYHA Class lllb symptoms.  Suspect poor compliance w/ home diuretic regimen. Most recent Cardiomems transmissions have shown dPAP to be at or below goal, but exam more suggestive of R>L sided heart failure. BNP only 79 but may be falsely low in setting of morbid obesity. Echo EF 60-65% RV normal this admission.   ?- He diuresed well, weight down total of 20 lb. He still has a lot of peripheral edema but think that may be more due to chronic venous insufficiency at this point than CHF.  ?- Continue torsemide 100 mg BID + 2.5 mg metolazone M, W, F.  ?- Aggressive K supplementation, increase KCl to 100 mEq tid ?- c/w unna boots, plan home health RN to manage post d/c  ?- Continue eplerenone 100 mg daily  ?- Off Jardiance (GI upset). ?- Continue Toprol XL 25 mg daily  ?- At d/c will need to lower cardiomems goal.  ?2. H/o PE/DVT: Suspect due to sedentary lifestyle.  Occurred in 2/21.  V/Q scan in 6/21 did not show evidence for chronic PE.  CT scan this admit limited because of suboptimal  opacification and artifact, however is suggestive of bilateral lower lobe segmental and subsegmental pulm emboli but no evidence of Rt heart strain but not certain of diagnosis due to quality of scan. Unable to get V/Q scan this admission due to size. LE venous dopplers negative for DVT ?- Discussed with [facility], would continue Eliquis for now, make sure he is fully compliant.  ?3. OSA: Cannot tolerate CPAP. ?4. Anxiety/depression: This is a major issue.   ?- Continue sertraline 50 mg daily.  ?5. CAD: Cath 1/22 with no significant CAD. No ischemic CP. HS trop this admit negative x 2. No chest pain.  ?- on statin + ? blocker.  ?  6. Hypertension: Stable. ?- Continue Toprol XL 25 mg bid.   ?- Switch to eplerenone 100 mg daily today  ?7. Atrial fibrillation: Paroxysmal. In SR today.  ?- Continue apixaban. No bleeding issues. ?- Continue Toprol XL. ?8. Bilateral Lower extremity erythema: Admitted 5/22 with possible BLE cellulitis. Since then, he has been on multiple rounds of Keflex, Levaquin and doxycycline over the last several months. Previously discussed with Janene Madeira, NP with ID, and recommended starting cefadroxil 1000 mg bid x 7 days, however he took this for 2 days and stopped due to GI upset. Compliance has remained a barrier. He continues w/ b/l LE erythema on exam but suspect most likely due to chronic venous stasis/ dependent rubor + CHF. Doubt cellulitis. WBC normal.  ?- c/w unna boots ?9. Cirrhosis: CT scan noted  nodular contour of liver, compatible with underlying cirrhosis. In setting of known RV dysfunction. LFTs ok. EGD 12/22 showed Portal hypertensive gastropathy. No varices. NH3 mildly elevated, 38 w/ mild confusion on admit.  ?- Lactulose ordered by primary team (also takes this for chronic constipation)  ?- on eplerenone + ? blocker  ?10. Anemia: Hgb 10.2>>8.9 >> 9.1>> 10.5  ?- Iron sats 5%.  ?- Received feraheme.  ?11. Hypokalemia: Improving, 3.4 today  ?- aggressive K supp, increase to 100 mg  tid  ?- continue eplerenone  ?12. Polymorphic VT ?- brief ~ 5 sec on 4/5, in setting of hypokalemia ?- no further NSVT  ? ?Cardiac Meds for Discharge: torsemide 100 mg bid, KCl 100 mEq tid, metolazone 2.5 mg

## 2021-07-17 NOTE — Progress Notes (Signed)
Mobility Specialist Progress Note ? ? 07/17/21 1200  ?Mobility  ?Activity Ambulated with assistance to bathroom  ?Level of Assistance Standby assist, set-up cues, supervision of patient - no hands on  ?Assistive Device Front wheel walker  ?Distance Ambulated (ft) 12 ft  ?Activity Response Tolerated well  ?$Mobility charge 1 Mobility  ? ?Received pt in bed urgently needing to use the BR. No physical assistance required but mod VC on posture + proper RW mechanics needed. Successful void. Pt beginning to move w/o MS being in proximity, reiterated the need for assistance when transferring. Left on EOB w/ lunch tray in front and call bell in reach. ? ?Holland Falling ?Mobility Specialist ?Phone Number [phone] ? ?

## 2021-07-17 NOTE — Assessment & Plan Note (Signed)
Continue with statin therapy.  ?

## 2021-07-18 ENCOUNTER — Other Ambulatory Visit (HOSPITAL_COMMUNITY): Payer: Self-pay

## 2021-07-18 ENCOUNTER — Telehealth (HOSPITAL_COMMUNITY): Payer: Self-pay

## 2021-07-18 NOTE — Telephone Encounter (Signed)
Transitions of Care Pharmacy  ° °Call attempted for a pharmacy transitions of care follow-up. HIPAA appropriate voicemail was left with call back information provided.  ° °Call attempt #1. Will follow-up in 2-3 days.  °  °

## 2021-07-18 NOTE — Progress Notes (Signed)
Paramedicine Encounter ? ? ? Patient ID: Charles Gray, male    DOB: 05/30/1955, 66 y.o.   MRN: [id number] ? ?Arrived for home visit for Charles Gray who reports feeling much better. He appeared to be walking about his home easily with his walker not appearing to be in any respiratory distress. Charles Gray reports he removed his UNNA boots himself last night. He says he urinated on himself and had to remove them. [facility] RN should be coming out today or tomorrow. Lower legs noted to be much improved since our last home visit prior to his hospital stay. He will complete doxycycline on Monday morning. (I placed this in his pill box) ? ?Medications were reviewed one by one and discussed at length. I filled pill box for one week. Pill box is not big enough for potassium pills so he will be taking 5 tablets three times daily out of the bottles. His extra two tablets on metolazone days are in the pill box. He agrees with this plan. I stressed the importance of med compliance and he verbalized understanding.  ? ?We talked at length about diet, fluid restrictions and sodium intake. He is using [employer] which all meals are refrigerated, microwaved meals that total less than 639m of sodium each. I advised him his total intake of sodium daily and he agreed and knows to be mindful of this when eating and snacking.  ? ? ?I reminded him of his upcoming appointment in the HF clinic and he plans to schedule [employer] transportation today.  ? ?I will reach out to HF clinic to send in lymphedema clinic referral.  ? ?Home visit complete. I plan to meet Charles Gray in clinic on Wednesday.  ? ?Refills:  ?NONE  ? ? ? ? ? ? ?ACTION: ?Home visit completed ? ? ? ? ? ? ?

## 2021-07-18 NOTE — Telephone Encounter (Signed)
Pharmacy Transitions of Care Follow-up Telephone Call ? ?Date of discharge: 07/17/21  ?Discharge Diagnosis: PE ? ?How have you been since you were released from the hospital? Patient has been well since discharge, no questions about meds at this time.  ? ?Medication changes made at discharge: ?     CHANGE how you take: ?eplerenone (INSPRA)  ?lactulose (encephalopathy) (CHRONULAC)  ?potassium chloride SA (KLOR-CON M)  ?torsemide (DEMADEX)  ?STOP taking: ?triamcinolone 0.025 % ointment (KENALOG)  ? ?Medication changes verified by the patient? Yes ?  ? ?Medication Accessibility: ? ?Home Pharmacy: Not discussed  ? ?Was the patient provided with refills on discharged medications? No  ? ?Have all prescriptions been transferred from [facility] to home pharmacy? N/A  ? ?Is the patient able to afford medications? Has insurance ?  ? ?Medication Review: ? ?APIXABAN (ELIQUIS)  ?Apixaban 10 mg BID initiated on 07/10/21. Will switch to apixaban 5 mg BID after 7 days (DATE 07/17/21).  ?- Discussed importance of taking medication around the same time everyday  ?- Advised patient of medications to avoid (NSAIDs, ASA)  ?- Educated that Tylenol (acetaminophen) will be the preferred analgesic to prevent risk of bleeding  ?- Emphasized importance of monitoring for signs and symptoms of bleeding (abnormal bruising, prolonged bleeding, nose bleeds, bleeding from gums, discolored urine, black tarry stools)  ?- Advised patient to alert all providers of anticoagulation therapy prior to starting a new medication or having a procedure  ? ? ?Follow-up Appointments: ? ?[facility] f/u appt confirmed? Scheduled to see Dr. Kirk Ruths on 08/05/21 @ 11:40AM.  ? ?If their condition worsens, is the pt aware to call PCP or go to the Emergency Dept.? Yes ? ?Final Patient Assessment: ?Patient has f/u scheduled and refills at home pharmacy ? ?

## 2021-07-21 ENCOUNTER — Other Ambulatory Visit (HOSPITAL_COMMUNITY): Payer: Self-pay | Admitting: Cardiology

## 2021-07-21 ENCOUNTER — Other Ambulatory Visit (HOSPITAL_COMMUNITY): Payer: Self-pay

## 2021-07-21 DIAGNOSIS — E876 Hypokalemia: Secondary | ICD-10-CM | POA: Diagnosis not present

## 2021-07-21 NOTE — Progress Notes (Signed)
? ?ADVANCED HF CLINIC NOTE ? ?Primary Care: Enid Skeens., MD ?Primary Cardiologist: Dr. Berniece Salines, DO  ?HF Cardiology: Dr. [name] ? ?HPI: ?66 y.o. 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   ? ?He has had multiple hospitalizations in 2021 for CHF exacerbation, requiring IV Lasix and change in PO regimen from lasix to torsemide. Echo 4/21 showed normal LVEF 55-60% w/ G1DD. RV not well visualized, but felt to have component of RV failure. [name] 4/21 showed normal PA pressure and normal cardiac output. PFTs also completed and c/w mild obstructive airway disease.  ? ?In 7/21, he had a coronary CTA.  This showed no coronary calcium but there was a concern for soft plaque in the distal LCx and distal RCA with hemodynamically significant stenosis.  He was managed medically. He has not had any chest pain, but was started on ranolazine after his CTA.  He feels like it makes his "head fuzzy."   ? ?Admitted with A/C diastolic heart failure 40/9735. Diuresed with IV lasix and transitioned to bumex 3 mg twice a day. Discharge weight 360 pounds.  ? ?He was admitted again in 12/21 with CHF.  Echo in 12/21 showed EF 32-99%, grade 2 diastolic dysfunction, RV poorly visualized.  ? ?Admitted 2/42/68 with A/C diastolic heart failure. Diuresed with IV lasix + metolazone. Transitioned to torsemide 60 mg twice a day and metolazone twice a day. D/C weight 364 pounds.  ? ?Admitted 5/28//22 with cellulitis. Treated with antibiotics.  ? ?Admitted 09/20/20 with  A/C CHF. He diuresed 5 L with IV lasix, weight 371 at discharge. ? ?He was readmitted for A/C CHF 7/22. He was diuresed with IV lasix + metolazone. RHC showed  optimized filling pressures and preserved CO/CI. He was discharged on torsemide 100 mg bid + metolazone M/W/F. Discharge weight 374 lbs. ? ?RHC (8/22) showed normal filling pressures & he underwent Cardiomems  placement. ? ?Admitted 9/10-9/12/22 for acute encephalopathy, felt secondary to polypharmacy. Ambien and gabapentin were briefly held. Ammonia mildly elevated and he was started on lactulose. Mentation returned to baseline and he was discharged home with Home Health PT/OT. ? ?Last seen in for f/u 02/05/21. Appeared slightly volume up. Given extra dose of metolazone. It was felt that most of leg edema likely due to venous insufficiency. Needs better adherence with UNNA boots/compression stockings. Now has home health and paramedicine.  ? ?Saw ID for suspected cellulitis. No active infection identified. Referral to lymphedema clinic submitted.  ? ?Admitted 3/23 with a/c dHF and worsening LE edema. Echo showed EF 60-65% RV normal this admission. He was diuresed with IV Lasix, down 20 lbs. LE venous dopplers negative DVT. Hospitalization complicated by severe  hypokalemia, discharged home on KCL 100 tid, weight 377.7 lbs. Cardiomems goal lowered to 9. ?  ?Today he returns for post hospital HF follow up, with paramedicine. Overall feeling fair. Legs are swelling more, took off his Unna boots yesterday as he urinated on them. He has dyspnea walking with his walker further distances around the house. Denies palpitations, CP, dizziness, or PND/Orthopnea. Appetite ok. No fever or chills. Taking all medications.  ? ?Cardiomems: PAD 6 on 07/22/21, (goal 10). ?  ?ECG (personally reviewed): SR with PAC 74 bpm ? ?PMH: ?1. HTN ?2. OSA ?3. Hyperlipidemia ?4. Low back pain ?5. PE/DVT in 2/21.  ?- V/Q scan in 6/21 was not suggestive of chronic PE.  ?6. Nonalcoholic steatohepatitis.  ?7. GI bleed  in 10/20.  ?8. Anxiety ?9. CAD: Coronary CTA in 7/21 with calcium score 0 but concern for soft plaque distal CFX and RCA.  By [facility], there was flow-limiting stenosis in distal CFX and distal RCA.  ?10. Chronic diastolic CHF: Echo in 7/68 with EF 55-60%, RV poorly visualized.  ?- RHC (4/21): mean RA 8, PA 20/8, mean PCWP 11, CI 2.8 ?- Echo  (12/21): EF 11-57%, grade 2 diastolic dysfunction, RV poorly visualized.   ?- RHC (8/22) normal filling pressures, RA mean 7, RV 29/9, PA 32/15, mean 22, PCWP mean 13, CO/CI 7.32/2.41. ?- Cardiomems (8/22) placement. ?- Echo (3/23): EF 60-65%, mild LVH, normal RV ?11. PFTs (4/21): Mild obstruction.  ?12. ABIs (10/21): Normal. ? ?Review of Systems: All systems reviewed and negative except as per HPI.  ? ?Current Outpatient Medications  ?Medication Sig Dispense Refill  ? apixaban (ELIQUIS) 5 MG TABS tablet Take 1 tablet (5 mg total) by mouth 2 (two) times daily. 60 tablet 0  ? Buprenorphine HCl-Naloxone HCl 8-2 MG FILM Place 1 tablet under the tongue 2 (two) times daily.    ? eplerenone (INSPRA) 25 MG tablet Take 4 tablets (100 mg total) by mouth daily. 120 tablet 0  ? gabapentin (NEURONTIN) 800 MG tablet Take 1 tablet (800 mg total) by mouth 3 (three) times daily. 90 tablet 1  ? lactulose, encephalopathy, (CHRONULAC) 10 GM/15ML SOLN Take 30 mLs (20 g total) by mouth 3 (three) times daily. 236 mL 0  ? metolazone (ZAROXOLYN) 2.5 MG tablet Take 1 tablet (2.5 mg total) by mouth 3 (three) times a week. Monday, Wednesday and Fridays 14 tablet 0  ? metoprolol succinate (TOPROL XL) 25 MG 24 hr tablet Take 1 tablet (25 mg total) by mouth at bedtime. 90 tablet 0  ? pantoprazole (PROTONIX) 40 MG tablet Take 40 mg by mouth 2 (two) times daily before a meal.    ? polyethylene glycol (MIRALAX / GLYCOLAX) 17 g packet Take 17 g by mouth as needed for mild constipation (mix and drink as directed).    ? potassium chloride SA (KLOR-CON M) 20 MEQ tablet Take 5 tablets (100 mEq total) by mouth 3 (three) times daily. Take one tablet extra (40 meq) with metolazone. 450 tablet 0  ? pravastatin (PRAVACHOL) 20 MG tablet Take 1 tablet (20 mg total) by mouth daily. 60 tablet 0  ? sertraline (ZOLOFT) 50 MG tablet TAKE 1 TABLET BY MOUTH DAILY 30 tablet 3  ? tamsulosin (FLOMAX) 0.4 MG CAPS capsule Take 1 capsule (0.4 mg total) by mouth daily. 30  capsule 0  ? torsemide (DEMADEX) 100 MG tablet Take 1 tablet (100 mg total) by mouth twice daily. 120 tablet 0  ? VENTOLIN HFA 108 (90 Base) MCG/ACT inhaler Inhale 1 puff into the lungs every 4 (four) hours as needed for shortness of breath. 6.7 g 1  ? zolpidem (AMBIEN) 10 MG tablet Take 1 tablet (10 mg total) by mouth at bedtime as needed for sleep. 30 tablet 0  ? ?No current facility-administered medications for this encounter.  ? ?Allergies  ?Allergen Reactions  ? Penicillins Other (See Comments)  ?  Caused convulsions as a child  ?Did it involve swelling of the face/tongue/throat, SOB, or low BP? N/A ?Did it involve sudden or severe rash/hives, skin peeling, or any reaction on the inside of your mouth or nose? N/A ?Did you need to seek medical attention at a hospital or doctor's office?N/A ?When did it last happen? N/A ?If all above answers are "  NO", may proceed with cephalosporin use.  ? ?Social History  ? ?Socioeconomic History  ? Marital status: Single  ?  Spouse name: Not on file  ? Number of children: Not on file  ? Years of education: Not on file  ? Highest education level: Not on file  ?Occupational History  ? Not on file  ?Tobacco Use  ? Smoking status: Never  ? Smokeless tobacco: Never  ?Vaping Use  ? Vaping Use: Never used  ?Substance and Sexual Activity  ? Alcohol use: Not Currently  ? Drug use: Not Currently  ?  Types: Marijuana  ?  Comment: Smoked for 30 years  ? Sexual activity: Not on file  ?Other Topics Concern  ? Not on file  ?Social History Narrative  ? Not on file  ? ?Social Determinants of Health  ? ?Financial Resource Strain: Medium Risk  ? Difficulty of Paying Living Expenses: Somewhat hard  ?Food Insecurity: No Food Insecurity  ? Worried About Charity fundraiser in the Last Year: Never true  ? Ran Out of Food in the Last Year: Never true  ?Transportation Needs: Unmet Transportation Needs  ? Lack of Transportation (Medical): Yes  ? Lack of Transportation (Non-Medical): No  ?Physical  Activity: Not on file  ?Stress: Not on file  ?Social Connections: Not on file  ?Intimate Partner Violence: Not on file  ?  ?Family History  ?Problem Relation Age of Onset  ? Hyperlipidemia Mother   ? Hypertension Mother   ? Stroke

## 2021-07-23 ENCOUNTER — Other Ambulatory Visit (HOSPITAL_COMMUNITY): Payer: Self-pay

## 2021-07-23 ENCOUNTER — Encounter (HOSPITAL_COMMUNITY): Payer: Self-pay

## 2021-07-23 ENCOUNTER — Ambulatory Visit (HOSPITAL_COMMUNITY)
Admit: 2021-07-23 | Discharge: 2021-07-23 | Disposition: A | Discharge status: Home / Self Care | Payer: Medicare HMO | Attending: Family Medicine | Admitting: Family Medicine

## 2021-07-23 VITALS — BP 118/70 | HR 75 | Wt 379.8 lb

## 2021-07-23 DIAGNOSIS — I5032 Chronic diastolic (congestive) heart failure: Secondary | ICD-10-CM | POA: Diagnosis not present

## 2021-07-23 DIAGNOSIS — E669 Obesity, unspecified: Secondary | ICD-10-CM | POA: Insufficient documentation

## 2021-07-23 DIAGNOSIS — R739 Hyperglycemia, unspecified: Secondary | ICD-10-CM

## 2021-07-23 DIAGNOSIS — G4733 Obstructive sleep apnea (adult) (pediatric): Secondary | ICD-10-CM | POA: Diagnosis not present

## 2021-07-23 DIAGNOSIS — F32A Depression, unspecified: Secondary | ICD-10-CM | POA: Diagnosis not present

## 2021-07-23 DIAGNOSIS — K3189 Other diseases of stomach and duodenum: Secondary | ICD-10-CM | POA: Diagnosis not present

## 2021-07-23 DIAGNOSIS — E876 Hypokalemia: Secondary | ICD-10-CM | POA: Diagnosis not present

## 2021-07-23 DIAGNOSIS — Z86711 Personal history of pulmonary embolism: Secondary | ICD-10-CM | POA: Diagnosis not present

## 2021-07-23 DIAGNOSIS — I472 Ventricular tachycardia, unspecified: Secondary | ICD-10-CM

## 2021-07-23 DIAGNOSIS — K766 Portal hypertension: Secondary | ICD-10-CM | POA: Insufficient documentation

## 2021-07-23 DIAGNOSIS — Z86718 Personal history of other venous thrombosis and embolism: Secondary | ICD-10-CM | POA: Diagnosis not present

## 2021-07-23 DIAGNOSIS — L539 Erythematous condition, unspecified: Secondary | ICD-10-CM | POA: Diagnosis not present

## 2021-07-23 DIAGNOSIS — Z79899 Other long term (current) drug therapy: Secondary | ICD-10-CM | POA: Diagnosis not present

## 2021-07-23 DIAGNOSIS — Z7901 Long term (current) use of anticoagulants: Secondary | ICD-10-CM | POA: Insufficient documentation

## 2021-07-23 DIAGNOSIS — F419 Anxiety disorder, unspecified: Secondary | ICD-10-CM | POA: Insufficient documentation

## 2021-07-23 DIAGNOSIS — K7460 Unspecified cirrhosis of liver: Secondary | ICD-10-CM | POA: Diagnosis not present

## 2021-07-23 DIAGNOSIS — R600 Localized edema: Secondary | ICD-10-CM

## 2021-07-23 DIAGNOSIS — I110 Hypertensive heart disease with heart failure: Secondary | ICD-10-CM | POA: Insufficient documentation

## 2021-07-23 DIAGNOSIS — K5909 Other constipation: Secondary | ICD-10-CM | POA: Insufficient documentation

## 2021-07-23 DIAGNOSIS — D649 Anemia, unspecified: Secondary | ICD-10-CM | POA: Insufficient documentation

## 2021-07-23 DIAGNOSIS — I480 Paroxysmal atrial fibrillation: Secondary | ICD-10-CM | POA: Insufficient documentation

## 2021-07-23 DIAGNOSIS — I872 Venous insufficiency (chronic) (peripheral): Secondary | ICD-10-CM | POA: Diagnosis not present

## 2021-07-23 DIAGNOSIS — I10 Essential (primary) hypertension: Secondary | ICD-10-CM

## 2021-07-23 DIAGNOSIS — J449 Chronic obstructive pulmonary disease, unspecified: Secondary | ICD-10-CM | POA: Diagnosis not present

## 2021-07-23 DIAGNOSIS — I2510 Atherosclerotic heart disease of native coronary artery without angina pectoris: Secondary | ICD-10-CM

## 2021-07-23 DIAGNOSIS — I50813 Acute on chronic right heart failure: Secondary | ICD-10-CM | POA: Diagnosis not present

## 2021-07-23 DIAGNOSIS — R6889 Other general symptoms and signs: Secondary | ICD-10-CM | POA: Diagnosis not present

## 2021-07-23 LAB — BASIC METABOLIC PANEL
Anion gap: 10 (ref 5–15)
BUN: 12 mg/dL (ref 8–23)
CO2: 31 mmol/L (ref 22–32)
Calcium: 9.3 mg/dL (ref 8.9–10.3)
Chloride: 95 mmol/L — ABNORMAL LOW (ref 98–111)
Creatinine, Ser: 1.1 mg/dL (ref 0.61–1.24)
GFR, Estimated: >60 mL/min (ref >60)
Glucose, Bld: 131 mg/dL — ABNORMAL HIGH (ref 70–99)
Potassium: 4 mmol/L (ref 3.5–5.1)
Sodium: 136 mmol/L (ref 135–145)

## 2021-07-23 LAB — CBC
HCT: 37.6 % — ABNORMAL LOW (ref 39.0–52.0)
Hemoglobin: 11.4 g/dL — ABNORMAL LOW (ref 13.0–17.0)
MCH: 23.9 pg — ABNORMAL LOW (ref 26.0–34.0)
MCHC: 30.3 g/dL (ref 30.0–36.0)
MCV: 78.8 fL — ABNORMAL LOW (ref 80.0–100.0)
Platelets: 116 10*3/uL — ABNORMAL LOW (ref 150–400)
RBC: 4.77 MIL/uL (ref 4.22–5.81)
RDW: 23.2 % — ABNORMAL HIGH (ref 11.5–15.5)
WBC: 8.2 10*3/uL (ref 4.0–10.5)
nRBC: 0 % (ref 0.0–0.2)

## 2021-07-23 NOTE — Patient Instructions (Addendum)
Thank you for coming in today ? ?Labs were done today, if any labs are abnormal the clinic will call you ?No news is good news  ? ?Your physician recommends that you schedule a follow-up appointment in:  ?6 weeks in clinic  ?4 months with Dr. [name] ? ?At the [facility], you and your health needs are our priority. As part of our continuing mission to provide you with exceptional heart care, we have created designated Provider Care Teams. These Care Teams include your primary Cardiologist (physician) and Advanced Practice Providers (APPs- Physician Assistants and Nurse Practitioners) who all work together to provide you with the care you need, when you need it.  ? ?You may see any of the following providers on your designated Care Team at your next follow up: ?Dr Glori Bickers ?Dr Loralie Champagne ?[name], NP ?Lyda Jester, PA ?Jessica Milford,NP ?Marlyce Huge, PA ?Audry Riles, PharmD ? ? ?Please be sure to bring in all your medications bottles to every appointment.  ? ?If you have any questions or concerns before your next appointment please send [place] a message through [place] or call our office at [phone].   ? ?TO LEAVE A MESSAGE FOR THE NURSE SELECT OPTION 2, PLEASE LEAVE A MESSAGE INCLUDING: ?YOUR NAME ?DATE OF BIRTH ?CALL BACK NUMBER ?REASON FOR CALL**this is important as we prioritize the call backs ? ?YOU WILL RECEIVE A CALL BACK THE SAME DAY AS LONG AS YOU CALL BEFORE 4:00 PM ? ? ?

## 2021-07-23 NOTE — Progress Notes (Signed)
Paramedicine Encounter ? ? ? Patient ID: Charles Gray, male    DOB: [birth year], 66 y.o.   MRN: [id number] ? ? ?Arrived at clinic with [place] for visit with Allena Katz NP. He reports feeling constantly sick to his stomach because of potassium pills. He says his legs are swollen today more than last week. He also complains of right side pain over his liver. Vitals and assessment completed by HF staff.  ? ? ? ?Referral for Lymphedema clinic sent in by [facility].  ? ?No med changes. Labs stable as of 1730 no changes.  ? ?I filled up pill box for one week. I plan to see him in the home in one week.  ? ?6 week follow up with APP clinic and 4 month follow up with [facility].  ? ?Clinic visit complete.  ? ? ?ACTION: ?Home visit completed ? ? ? ? ? ? ?

## 2021-07-24 DIAGNOSIS — F1120 Opioid dependence, uncomplicated: Secondary | ICD-10-CM | POA: Diagnosis not present

## 2021-07-24 DIAGNOSIS — F332 Major depressive disorder, recurrent severe without psychotic features: Secondary | ICD-10-CM | POA: Diagnosis not present

## 2021-07-24 DIAGNOSIS — F411 Generalized anxiety disorder: Secondary | ICD-10-CM | POA: Diagnosis not present

## 2021-07-26 ENCOUNTER — Encounter (HOSPITAL_COMMUNITY): Payer: Self-pay

## 2021-07-29 ENCOUNTER — Other Ambulatory Visit (HOSPITAL_COMMUNITY): Payer: Self-pay

## 2021-07-29 NOTE — Progress Notes (Signed)
Paramedicine Encounter ? ? ? Patient ID: Charles Gray, male    DOB: 1955-09-27, 66 y.o.   MRN: [id number] ? ?Arrived for home visit for [place] who reports feeling okay but keeps having stomach upset however he continues to not eat when taking his medications. I reminded him the importance of this. He verbalized understanding. He reports feeling tired and short of breath when walking short distanced which is normal for him. I advised him the best way to help overcome this would be to exercise. I mentioned Cardiac Rehab or Outpatient Therapy and he said he would "think about it" as he doesn't want to get out of the house more than once a week. I advised him until he begins to take action he will continue to feel this way. He verbalized understanding.  ? ?Vitals and assessment as noted in report.  ?Legs wrapped by [facility]. Lungs clear. Abdomen soft and non tender.  ? ?He has not heard from lymphedema clinic, I will follow up.  ? ?Meds reviewed and pill box filled for one week.  ? ?No upcoming appointments noted.  ? ?Fluid and diet restrictions discussed.  ? ?He is reminded to complete a cardio mems reading daily.  ? ?Home visit complete.  ? ?Refills: NONE  ? ?Patient Care Team: ?Enid Skeens., MD as PCP - General (Family Medicine) ?Berniece Salines, DO as PCP - Cardiology (Cardiology) ?Jorge Ny, LCSW as Education officer, museum (Licensed [employer] representative) ? ?Patient Active Problem List  ? Diagnosis Date Noted  ? Dyslipidemia 07/17/2021  ? Lymphedema 07/08/2021  ? Melena   ? Gastric polyps   ? UGIB (upper gastrointestinal bleed) 04/06/2021  ? Acute encephalopathy 12/21/2020  ? COPD with acute exacerbation ([place]) 12/21/2020  ? Chronic diastolic (congestive) heart failure ([place]) 11/14/2020  ? Syncope 10/28/2020  ? CHF (congestive heart failure) ([place]) 10/28/2020  ? Chronic venous stasis dermatitis of both lower extremities 08/27/2020  ? Cellulitis of both lower extremities 08/14/2020  ? Iron deficiency anemia 08/14/2020  ? GERD  without esophagitis 06/10/2020  ? Atrial fibrillation with rapid ventricular response ([place]) 03/24/2020  ? Acute on chronic right heart failure ([place]) 01/27/2020  ? Acute respiratory failure with hypoxia ([place])   ? Fatty liver disease, nonalcoholic   ? Shock ([place])   ? Personal history of DVT (deep vein thrombosis) 12/26/2019  ? Chronic anticoagulation 12/26/2019  ? Palpitations 12/26/2019  ? Paroxysmal atrial fibrillation ([place]) 12/23/2019  ? Heart failure, systolic, acute ([place]) 62/70/3500  ? Acute on chronic congestive heart failure ([place])   ? Acute on chronic diastolic CHF (congestive heart failure) ([place]) 08/23/2019  ? Chest pain 08/23/2019  ? Lactic acidosis 08/23/2019  ? History of pulmonary embolism 08/12/2019  ? Atrial tachycardia ([place]) 08/12/2019  ? Occasional tremors 08/12/2019  ? OSA (obstructive sleep apnea) 08/12/2019  ? AKI (acute kidney injury) ([place]) 08/08/2019  ? Acute on chronic right-sided heart failure ([place]) 08/06/2019  ? Acute pulmonary embolism ([place]) 06/08/2019  ? Normocytic anemia 06/08/2019  ? Pulmonary emboli ([place]) 06/08/2019  ? Chronic heart failure with preserved ejection fraction ([place]) 05/30/2019  ? Class 2 severe obesity due to excess calories with serious comorbidity and body mass index (BMI) of 39.0 to 39.9 in adult [facility]) 05/30/2019  ? Morbid obesity with BMI of 40.0-44.9, adult ([place]) 05/30/2019  ? Hyponatremia   ? Drug induced constipation   ? Peripheral edema   ? Hypotension due to drugs   ? Hypoalbuminemia due to protein-calorie malnutrition ([place])   ? Thrombocytopenia (  HCC)   ? Anemia of chronic disease   ? Cirrhosis of liver without ascites ([place])   ? Chronic pain syndrome   ? Debility 03/13/2019  ? Portal hypertensive gastropathy ([place]) 03/06/2019  ? Dyspnea   ? GIB (gastrointestinal bleeding) 03/04/2019  ? Mixed hyperlipidemia 01/24/2019  ? Insomnia 01/24/2019  ? Hyperlipidemia 01/24/2019  ? Essential hypertension 01/24/2019  ? Lumbar disc herniation 01/24/2019  ? ? ?Current Outpatient  Medications:  ?  apixaban (ELIQUIS) 5 MG TABS tablet, Take 1 tablet (5 mg total) by mouth 2 (two) times daily., Disp: 60 tablet, Rfl: 0 ?  Buprenorphine HCl-Naloxone HCl 8-2 MG FILM, Place 1 tablet under the tongue 2 (two) times daily., Disp: , Rfl:  ?  eplerenone (INSPRA) 25 MG tablet, Take 4 tablets (100 mg total) by mouth daily., Disp: 120 tablet, Rfl: 0 ?  gabapentin (NEURONTIN) 800 MG tablet, Take 1 tablet (800 mg total) by mouth 3 (three) times daily., Disp: 90 tablet, Rfl: 1 ?  lactulose, encephalopathy, (CHRONULAC) 10 GM/15ML SOLN, Take 30 mLs (20 g total) by mouth 3 (three) times daily., Disp: 236 mL, Rfl: 0 ?  metolazone (ZAROXOLYN) 2.5 MG tablet, Take 1 tablet (2.5 mg total) by mouth 3 (three) times a week. Monday, Wednesday and Fridays, Disp: 14 tablet, Rfl: 0 ?  metoprolol succinate (TOPROL XL) 25 MG 24 hr tablet, Take 1 tablet (25 mg total) by mouth at bedtime., Disp: 90 tablet, Rfl: 0 ?  pantoprazole (PROTONIX) 40 MG tablet, Take 40 mg by mouth 2 (two) times daily before a meal., Disp: , Rfl:  ?  polyethylene glycol (MIRALAX / GLYCOLAX) 17 g packet, Take 17 g by mouth as needed for mild constipation (mix and drink as directed)., Disp: , Rfl:  ?  potassium chloride SA (KLOR-CON M) 20 MEQ tablet, Take 5 tablets (100 mEq total) by mouth 3 (three) times daily. Take one tablet extra (40 meq) with metolazone., Disp: 450 tablet, Rfl: 0 ?  pravastatin (PRAVACHOL) 20 MG tablet, Take 1 tablet (20 mg total) by mouth daily., Disp: 60 tablet, Rfl: 0 ?  sertraline (ZOLOFT) 50 MG tablet, TAKE 1 TABLET BY MOUTH DAILY, Disp: 30 tablet, Rfl: 3 ?  tamsulosin (FLOMAX) 0.4 MG CAPS capsule, Take 1 capsule (0.4 mg total) by mouth daily., Disp: 30 capsule, Rfl: 0 ?  torsemide (DEMADEX) 100 MG tablet, Take 1 tablet (100 mg total) by mouth twice daily., Disp: 120 tablet, Rfl: 0 ?  VENTOLIN HFA 108 (90 Base) MCG/ACT inhaler, Inhale 1 puff into the lungs every 4 (four) hours as needed for shortness of breath., Disp: 6.7 g, Rfl:  1 ?  zolpidem (AMBIEN) 10 MG tablet, Take 1 tablet (10 mg total) by mouth at bedtime as needed for sleep., Disp: 30 tablet, Rfl: 0 ?Allergies  ?Allergen Reactions  ? Penicillins Other (See Comments)  ?  Caused convulsions as a child  ?Did it involve swelling of the face/tongue/throat, SOB, or low BP? N/A ?Did it involve sudden or severe rash/hives, skin peeling, or any reaction on the inside of your mouth or nose? N/A ?Did you need to seek medical attention at a hospital or doctor's office?N/A ?When did it last happen? N/A ?If all above answers are "NO", may proceed with cephalosporin use.  ? ? ? ?Social History  ? ?Socioeconomic History  ? Marital status: Single  ?  Spouse name: Not on file  ? Number of children: Not on file  ? Years of education: Not on file  ? Highest  education level: Not on file  ?Occupational History  ? Not on file  ?Tobacco Use  ? Smoking status: Never  ? Smokeless tobacco: Never  ?Vaping Use  ? Vaping Use: Never used  ?Substance and Sexual Activity  ? Alcohol use: Not Currently  ? Drug use: Not Currently  ?  Types: Marijuana  ?  Comment: Smoked for 30 years  ? Sexual activity: Not on file  ?Other Topics Concern  ? Not on file  ?Social History Narrative  ? Not on file  ? ?Social Determinants of Health  ? ?Financial Resource Strain: Medium Risk  ? Difficulty of Paying Living Expenses: Somewhat hard  ?Food Insecurity: No Food Insecurity  ? Worried About Charity fundraiser in the Last Year: Never true  ? Ran Out of Food in the Last Year: Never true  ?Transportation Needs: Unmet Transportation Needs  ? Lack of Transportation (Medical): Yes  ? Lack of Transportation (Non-Medical): No  ?Physical Activity: Not on file  ?Stress: Not on file  ?Social Connections: Not on file  ?Intimate Partner Violence: Not on file  ? ? ?Physical Exam ?Vitals reviewed.  ?Constitutional:   ?   Appearance: Normal appearance. He is obese.  ?HENT:  ?   Head: Normocephalic.  ?   Nose: Nose normal.  ?   Mouth/Throat:  ?    Mouth: Mucous membranes are moist.  ?   Pharynx: Oropharynx is clear.  ?Eyes:  ?   Conjunctiva/sclera: Conjunctivae normal.  ?   Pupils: Pupils are equal, round, and reactive to light.  ?Cardiovascular:

## 2021-07-30 ENCOUNTER — Telehealth (HOSPITAL_COMMUNITY): Payer: Self-pay

## 2021-07-30 NOTE — Telephone Encounter (Signed)
Lj contacted me requesting the reading of his cardiomems for today.  ? ?Reading today is 7  ?GOAL IS 9  ?

## 2021-08-05 ENCOUNTER — Other Ambulatory Visit (HOSPITAL_COMMUNITY): Payer: Self-pay

## 2021-08-05 ENCOUNTER — Encounter (HOSPITAL_COMMUNITY): Payer: Medicare HMO | Admitting: Cardiology

## 2021-08-05 NOTE — Progress Notes (Signed)
Paramedicine Encounter ? ? ? Patient ID: Charles Gray, male    DOB: 1955-09-18, 66 y.o.   MRN: [id number] ? ?Arrived for home visit for Charles Gray who was seated in his recliner alert and oriented. He reports he is feeling okay today but his legs are hurting. His legs were wrapped by [facility] RN yesterday. They do not appear to be too tight, he states they are sore.  ? ?Vitals and assessment obtained.  ? ?Weight up 5 lbs this week. He reports eating salty snacks and meals including pickles, chips, crackers and sodas. I educated him on the importance of avoiding salt in his diet to help keep him healthy. He verbalized understanding. I advised him it would be best to schedule a follow up with his PCP soon, he agreed and will do same.  ? ?I contacted Lymphedema clinic to get Charles Gray scheduled but had to leave a VM. I will continue to reach out.  ? ?Medications were reviewed and confirmed. Pill box filled accordingly for one week.  ? ?Refills: ?Potassium  ? ?Charles Gray should be getting shipment of [employer] tomorrow. I advised him to use these and to avoid salty snacks in between meals and to try to get some exercise. He verbalized agreement.  ? ?He was also made aware to submit a cardiomems reading daily.  ? ?Home visit complete. I will see Charles Gray in one week.  ? ? ? ? ?Patient Care Team: ?Enid Skeens., MD as PCP - General (Family Medicine) ?Berniece Salines, DO as PCP - Cardiology (Cardiology) ?Jorge Ny, LCSW as Education officer, museum (Licensed [employer] representative) ? ?Patient Active Problem List  ? Diagnosis Date Noted  ? Dyslipidemia 07/17/2021  ? Lymphedema 07/08/2021  ? Melena   ? Gastric polyps   ? UGIB (upper gastrointestinal bleed) 04/06/2021  ? Acute encephalopathy 12/21/2020  ? COPD with acute exacerbation ([place]) 12/21/2020  ? Chronic diastolic (congestive) heart failure ([place]) 11/14/2020  ? Syncope 10/28/2020  ? CHF (congestive heart failure) ([place]) 10/28/2020  ? Chronic venous stasis dermatitis of both lower extremities  08/27/2020  ? Cellulitis of both lower extremities 08/14/2020  ? Iron deficiency anemia 08/14/2020  ? GERD without esophagitis 06/10/2020  ? Atrial fibrillation with rapid ventricular response ([place]) 03/24/2020  ? Acute on chronic right heart failure ([place]) 01/27/2020  ? Acute respiratory failure with hypoxia ([place])   ? Fatty liver disease, nonalcoholic   ? Shock ([place])   ? Personal history of DVT (deep vein thrombosis) 12/26/2019  ? Chronic anticoagulation 12/26/2019  ? Palpitations 12/26/2019  ? Paroxysmal atrial fibrillation ([place]) 12/23/2019  ? Heart failure, systolic, acute ([place]) 02/04/8526  ? Acute on chronic congestive heart failure ([place])   ? Acute on chronic diastolic CHF (congestive heart failure) ([place]) 08/23/2019  ? Chest pain 08/23/2019  ? Lactic acidosis 08/23/2019  ? History of pulmonary embolism 08/12/2019  ? Atrial tachycardia ([place]) 08/12/2019  ? Occasional tremors 08/12/2019  ? OSA (obstructive sleep apnea) 08/12/2019  ? AKI (acute kidney injury) ([place]) 08/08/2019  ? Acute on chronic right-sided heart failure ([place]) 08/06/2019  ? Acute pulmonary embolism ([place]) 06/08/2019  ? Normocytic anemia 06/08/2019  ? Pulmonary emboli ([place]) 06/08/2019  ? Chronic heart failure with preserved ejection fraction ([place]) 05/30/2019  ? Class 2 severe obesity due to excess calories with serious comorbidity and body mass index (BMI) of 39.0 to 39.9 in adult [facility]) 05/30/2019  ? Morbid obesity with BMI of 40.0-44.9, adult ([place]) 05/30/2019  ? Hyponatremia   ? Drug induced  constipation   ? Peripheral edema   ? Hypotension due to drugs   ? Hypoalbuminemia due to protein-calorie malnutrition ([place])   ? Thrombocytopenia ([place])   ? Anemia of chronic disease   ? Cirrhosis of liver without ascites ([place])   ? Chronic pain syndrome   ? Debility 03/13/2019  ? Portal hypertensive gastropathy ([place]) 03/06/2019  ? Dyspnea   ? GIB (gastrointestinal bleeding) 03/04/2019  ? Mixed hyperlipidemia 01/24/2019  ? Insomnia 01/24/2019  ? Hyperlipidemia  01/24/2019  ? Essential hypertension 01/24/2019  ? Lumbar disc herniation 01/24/2019  ? ? ?Current Outpatient Medications:  ?  apixaban (ELIQUIS) 5 MG TABS tablet, Take 1 tablet (5 mg total) by mouth 2 (two) times daily., Disp: 60 tablet, Rfl: 0 ?  Buprenorphine HCl-Naloxone HCl 8-2 MG FILM, Place 1 tablet under the tongue 2 (two) times daily., Disp: , Rfl:  ?  eplerenone (INSPRA) 25 MG tablet, Take 4 tablets (100 mg total) by mouth daily., Disp: 120 tablet, Rfl: 0 ?  gabapentin (NEURONTIN) 800 MG tablet, Take 1 tablet (800 mg total) by mouth 3 (three) times daily., Disp: 90 tablet, Rfl: 1 ?  lactulose, encephalopathy, (CHRONULAC) 10 GM/15ML SOLN, Take 30 mLs (20 g total) by mouth 3 (three) times daily., Disp: 236 mL, Rfl: 0 ?  metolazone (ZAROXOLYN) 2.5 MG tablet, Take 1 tablet (2.5 mg total) by mouth 3 (three) times a week. Monday, Wednesday and Fridays, Disp: 14 tablet, Rfl: 0 ?  metoprolol succinate (TOPROL XL) 25 MG 24 hr tablet, Take 1 tablet (25 mg total) by mouth at bedtime., Disp: 90 tablet, Rfl: 0 ?  pantoprazole (PROTONIX) 40 MG tablet, Take 40 mg by mouth 2 (two) times daily before a meal., Disp: , Rfl:  ?  polyethylene glycol (MIRALAX / GLYCOLAX) 17 g packet, Take 17 g by mouth as needed for mild constipation (mix and drink as directed)., Disp: , Rfl:  ?  potassium chloride SA (KLOR-CON M) 20 MEQ tablet, Take 5 tablets (100 mEq total) by mouth 3 (three) times daily. Take one tablet extra (40 meq) with metolazone., Disp: 450 tablet, Rfl: 0 ?  pravastatin (PRAVACHOL) 20 MG tablet, Take 1 tablet (20 mg total) by mouth daily., Disp: 60 tablet, Rfl: 0 ?  sertraline (ZOLOFT) 50 MG tablet, TAKE 1 TABLET BY MOUTH DAILY, Disp: 30 tablet, Rfl: 3 ?  tamsulosin (FLOMAX) 0.4 MG CAPS capsule, Take 1 capsule (0.4 mg total) by mouth daily., Disp: 30 capsule, Rfl: 0 ?  torsemide (DEMADEX) 100 MG tablet, Take 1 tablet (100 mg total) by mouth twice daily., Disp: 120 tablet, Rfl: 0 ?  VENTOLIN HFA 108 (90 Base) MCG/ACT  inhaler, Inhale 1 puff into the lungs every 4 (four) hours as needed for shortness of breath., Disp: 6.7 g, Rfl: 1 ?  zolpidem (AMBIEN) 10 MG tablet, Take 1 tablet (10 mg total) by mouth at bedtime as needed for sleep., Disp: 30 tablet, Rfl: 0 ?Allergies  ?Allergen Reactions  ? Penicillins Other (See Comments)  ?  Caused convulsions as a child  ?Did it involve swelling of the face/tongue/throat, SOB, or low BP? N/A ?Did it involve sudden or severe rash/hives, skin peeling, or any reaction on the inside of your mouth or nose? N/A ?Did you need to seek medical attention at a hospital or doctor's office?N/A ?When did it last happen? N/A ?If all above answers are "NO", may proceed with cephalosporin use.  ? ? ? ?Social History  ? ?Socioeconomic History  ? Marital status: Single  ?  Spouse name: Not on file  ? Number of children: Not on file  ? Years of education: Not on file  ? Highest education level: Not on file  ?Occupational History  ? Not on file  ?Tobacco Use  ? Smoking status: Never  ? Smokeless tobacco: Never  ?Vaping Use  ? Vaping Use: Never used  ?Substance and Sexual Activity  ? Alcohol use: Not Currently  ? Drug use: Not Currently  ?  Types: Marijuana  ?  Comment: Smoked for 30 years  ? Sexual activity: Not on file  ?Other Topics Concern  ? Not on file  ?Social History Narrative  ? Not on file  ? ?Social Determinants of Health  ? ?Financial Resource Strain: Medium Risk  ? Difficulty of Paying Living Expenses: Somewhat hard  ?Food Insecurity: No Food Insecurity  ? Worried About Charity fundraiser in the Last Year: Never true  ? Ran Out of Food in the Last Year: Never true  ?Transportation Needs: Unmet Transportation Needs  ? Lack of Transportation (Medical): Yes  ? Lack of Transportation (Non-Medical): No  ?Physical Activity: Not on file  ?Stress: Not on file  ?Social Connections: Not on file  ?Intimate Partner Violence: Not on file  ? ? ?Physical Exam ?Vitals reviewed.  ?Constitutional:   ?   Appearance:  Normal appearance. He is obese.  ?HENT:  ?   Head: Normocephalic.  ?   Nose: Nose normal.  ?   Mouth/Throat:  ?   Mouth: Mucous membranes are moist.  ?   Pharynx: Oropharynx is clear.  ?Eyes:  ?   Conjunctiva/sclera:

## 2021-08-06 ENCOUNTER — Other Ambulatory Visit (HOSPITAL_COMMUNITY): Payer: Self-pay | Admitting: *Deleted

## 2021-08-06 ENCOUNTER — Telehealth (HOSPITAL_COMMUNITY): Payer: Self-pay | Admitting: Licensed Clinical Social Worker

## 2021-08-06 MED ORDER — POTASSIUM CHLORIDE CRYS ER 20 MEQ PO TBCR: 100.0000 meq | EXTENDED_RELEASE_TABLET | Freq: Three times a day (TID) | ORAL | 6 refills | Status: DC

## 2021-08-06 NOTE — Telephone Encounter (Signed)
HF Paramedicine Team Based Care Meeting ? ?HF MD- NA  ?HF NP - [name] Clegg NP-C  ? ?[facility] HF Paramedicine  ?Marylouise Stacks ?Jeris Penta  ?Tammy Sours ? ?Hospital admit within the last 30 days for heart failure? yes ? ?Medications concerns? Compliant with meds but paramedic continuing to fill med box- have tried education because he shows knowledge of medication but gets overwhelmed when we've tried ? ?Transportation issues ? Using Medicare transport right now- going ok ? ?Education needs? Needs continuous education on sodium restrictions ? ?SDOH- no ? ?Eligible for discharge? Don't think he would do well with discharge at this time will likely need chronic care ? ?Jorge Ny, LCSW ?Clinical Social Worker ?[facility] ?Desk#: [phone] ?Cell#: [phone] ? ?

## 2021-08-08 DIAGNOSIS — E876 Hypokalemia: Secondary | ICD-10-CM | POA: Diagnosis not present

## 2021-08-11 ENCOUNTER — Other Ambulatory Visit (HOSPITAL_COMMUNITY): Payer: Self-pay

## 2021-08-11 ENCOUNTER — Telehealth (HOSPITAL_COMMUNITY): Payer: Self-pay | Admitting: Pharmacy Technician

## 2021-08-11 NOTE — Telephone Encounter (Signed)
Patient Advocate Encounter ?  ?Received notification from [facility] that prior authorization for Potassium is required. ?  ?PA submitted on CoverMyMeds ?Key [id number] ?Status is pending ?  ?Will continue to follow. ? ?

## 2021-08-12 ENCOUNTER — Other Ambulatory Visit (HOSPITAL_COMMUNITY): Payer: Self-pay

## 2021-08-12 NOTE — Telephone Encounter (Signed)
Advanced Heart Failure Patient Advocate Encounter ? ?Prior Authorization for Potassium has been approved.   ? ?PA# [id number] ?Effective dates: 04/13/21 through 04/12/22 ? ?Charlann Boxer, CPhT ? ?

## 2021-08-12 NOTE — Progress Notes (Signed)
Paramedicine Encounter ? ? ? Patient ID: Charles Gray, male    DOB: February 22, 1956, 66 y.o.   MRN: [id number] ? ?-Currently his phone is not working, he can only make outgoing text messages and use wifi calling.  ? ?Arrived for home visit for Charles Gray who reports feeling sick today. He complained of feeling dizzy, tired and short of breath. These are symptoms he exhibits often. He denied chest pain. He walked to the bathroom with his walker to the bathroom and appeared tired and slightly short of breath.  ? ?Today he weighed 394lbs, he is up 11 lbs from last week. He reportedly missed his metolazone yesterday. He also reports eating more than usual over the last few days. I advised him to submit his CardioMems daily. Limit his fluid and salt intake, he verbalized understanding.  ? ?Vitals and assessment were obtained.  ? ?Lower legs wrapped with unna boots by his home health nurse who is supposed to be coming back out on Wednesday.  ? ?Medications reviewed and confirmed, pill box filled accordingly.  ?Refills- Lactulose, Eliquis, Potassium  ? ?I educated Charles Gray on importance of taking all medications when prescribed and to adhere to his diet. He verbalized understanding.  ? ?He is aware of wound care appointment that was scheduled today and he knows he has to call transportation to set his ride up this week. He follows up with PCP on 5/12 at 3:00 also.  ? ?Home visit complete. I will see Charles Gray in one week.  ? ? ? ? ? ? ? ?Patient Care Team: ?Enid Skeens., MD as PCP - General (Family Medicine) ?Berniece Salines, DO as PCP - Cardiology (Cardiology) ?Jorge Ny, LCSW as Education officer, museum (Licensed [employer] representative) ? ?Patient Active Problem List  ? Diagnosis Date Noted  ? Dyslipidemia 07/17/2021  ? Lymphedema 07/08/2021  ? Melena   ? Gastric polyps   ? UGIB (upper gastrointestinal bleed) 04/06/2021  ? Acute encephalopathy 12/21/2020  ? COPD with acute exacerbation ([place]) 12/21/2020  ? Chronic diastolic (congestive) heart  failure ([place]) 11/14/2020  ? Syncope 10/28/2020  ? CHF (congestive heart failure) ([place]) 10/28/2020  ? Chronic venous stasis dermatitis of both lower extremities 08/27/2020  ? Cellulitis of both lower extremities 08/14/2020  ? Iron deficiency anemia 08/14/2020  ? GERD without esophagitis 06/10/2020  ? Atrial fibrillation with rapid ventricular response ([place]) 03/24/2020  ? Acute on chronic right heart failure ([place]) 01/27/2020  ? Acute respiratory failure with hypoxia ([place])   ? Fatty liver disease, nonalcoholic   ? Shock ([place])   ? Personal history of DVT (deep vein thrombosis) 12/26/2019  ? Chronic anticoagulation 12/26/2019  ? Palpitations 12/26/2019  ? Paroxysmal atrial fibrillation ([place]) 12/23/2019  ? Heart failure, systolic, acute ([place]) 02/58/5277  ? Acute on chronic congestive heart failure ([place])   ? Acute on chronic diastolic CHF (congestive heart failure) ([place]) 08/23/2019  ? Chest pain 08/23/2019  ? Lactic acidosis 08/23/2019  ? History of pulmonary embolism 08/12/2019  ? Atrial tachycardia ([place]) 08/12/2019  ? Occasional tremors 08/12/2019  ? OSA (obstructive sleep apnea) 08/12/2019  ? AKI (acute kidney injury) ([place]) 08/08/2019  ? Acute on chronic right-sided heart failure ([place]) 08/06/2019  ? Acute pulmonary embolism ([place]) 06/08/2019  ? Normocytic anemia 06/08/2019  ? Pulmonary emboli ([place]) 06/08/2019  ? Chronic heart failure with preserved ejection fraction ([place]) 05/30/2019  ? Class 2 severe obesity due to excess calories with serious comorbidity and body mass index (BMI) of 39.0 to 39.9 in adult (  [place]) 05/30/2019  ? Morbid obesity with BMI of 40.0-44.9, adult ([place]) 05/30/2019  ? Hyponatremia   ? Drug induced constipation   ? Peripheral edema   ? Hypotension due to drugs   ? Hypoalbuminemia due to protein-calorie malnutrition ([place])   ? Thrombocytopenia ([place])   ? Anemia of chronic disease   ? Cirrhosis of liver without ascites ([place])   ? Chronic pain syndrome   ? Debility 03/13/2019  ? Portal hypertensive  gastropathy ([place]) 03/06/2019  ? Dyspnea   ? GIB (gastrointestinal bleeding) 03/04/2019  ? Mixed hyperlipidemia 01/24/2019  ? Insomnia 01/24/2019  ? Hyperlipidemia 01/24/2019  ? Essential hypertension 01/24/2019  ? Lumbar disc herniation 01/24/2019  ? ? ?Current Outpatient Medications:  ?  apixaban (ELIQUIS) 5 MG TABS tablet, Take 1 tablet (5 mg total) by mouth 2 (two) times daily., Disp: 60 tablet, Rfl: 0 ?  Buprenorphine HCl-Naloxone HCl 8-2 MG FILM, Place 1 tablet under the tongue 2 (two) times daily., Disp: , Rfl:  ?  eplerenone (INSPRA) 25 MG tablet, Take 4 tablets (100 mg total) by mouth daily., Disp: 120 tablet, Rfl: 0 ?  gabapentin (NEURONTIN) 800 MG tablet, Take 1 tablet (800 mg total) by mouth 3 (three) times daily., Disp: 90 tablet, Rfl: 1 ?  lactulose, encephalopathy, (CHRONULAC) 10 GM/15ML SOLN, Take 30 mLs (20 g total) by mouth 3 (three) times daily., Disp: 236 mL, Rfl: 0 ?  metolazone (ZAROXOLYN) 2.5 MG tablet, Take 1 tablet (2.5 mg total) by mouth 3 (three) times a week. Monday, Wednesday and Fridays, Disp: 14 tablet, Rfl: 0 ?  metoprolol succinate (TOPROL XL) 25 MG 24 hr tablet, Take 1 tablet (25 mg total) by mouth at bedtime., Disp: 90 tablet, Rfl: 0 ?  pantoprazole (PROTONIX) 40 MG tablet, Take 40 mg by mouth 2 (two) times daily before a meal., Disp: , Rfl:  ?  polyethylene glycol (MIRALAX / GLYCOLAX) 17 g packet, Take 17 g by mouth as needed for mild constipation (mix and drink as directed)., Disp: , Rfl:  ?  potassium chloride SA (KLOR-CON M) 20 MEQ tablet, Take 5 tablets (100 mEq total) by mouth 3 (three) times daily. Take one tablet extra (40 meq) with metolazone., Disp: 463 tablet, Rfl: 6 ?  pravastatin (PRAVACHOL) 20 MG tablet, Take 1 tablet (20 mg total) by mouth daily., Disp: 60 tablet, Rfl: 0 ?  sertraline (ZOLOFT) 50 MG tablet, TAKE 1 TABLET BY MOUTH DAILY, Disp: 30 tablet, Rfl: 3 ?  tamsulosin (FLOMAX) 0.4 MG CAPS capsule, Take 1 capsule (0.4 mg total) by mouth daily., Disp: 30 capsule,  Rfl: 0 ?  torsemide (DEMADEX) 100 MG tablet, Take 1 tablet (100 mg total) by mouth twice daily., Disp: 120 tablet, Rfl: 0 ?  VENTOLIN HFA 108 (90 Base) MCG/ACT inhaler, Inhale 1 puff into the lungs every 4 (four) hours as needed for shortness of breath., Disp: 6.7 g, Rfl: 1 ?  zolpidem (AMBIEN) 10 MG tablet, Take 1 tablet (10 mg total) by mouth at bedtime as needed for sleep., Disp: 30 tablet, Rfl: 0 ?Allergies  ?Allergen Reactions  ? Penicillins Other (See Comments)  ?  Caused convulsions as a child  ?Did it involve swelling of the face/tongue/throat, SOB, or low BP? N/A ?Did it involve sudden or severe rash/hives, skin peeling, or any reaction on the inside of your mouth or nose? N/A ?Did you need to seek medical attention at a hospital or doctor's office?N/A ?When did it last happen? N/A ?If all above answers are "NO",  may proceed with cephalosporin use.  ? ? ? ?Social History  ? ?Socioeconomic History  ? Marital status: Single  ?  Spouse name: Not on file  ? Number of children: Not on file  ? Years of education: Not on file  ? Highest education level: Not on file  ?Occupational History  ? Not on file  ?Tobacco Use  ? Smoking status: Never  ? Smokeless tobacco: Never  ?Vaping Use  ? Vaping Use: Never used  ?Substance and Sexual Activity  ? Alcohol use: Not Currently  ? Drug use: Not Currently  ?  Types: Marijuana  ?  Comment: Smoked for 30 years  ? Sexual activity: Not on file  ?Other Topics Concern  ? Not on file  ?Social History Narrative  ? Not on file  ? ?Social Determinants of Health  ? ?Financial Resource Strain: Medium Risk  ? Difficulty of Paying Living Expenses: Somewhat hard  ?Food Insecurity: No Food Insecurity  ? Worried About Charity fundraiser in the Last Year: Never true  ? Ran Out of Food in the Last Year: Never true  ?Transportation Needs: Unmet Transportation Needs  ? Lack of Transportation (Medical): Yes  ? Lack of Transportation (Non-Medical): No  ?Physical Activity: Not on file  ?Stress: Not  on file  ?Social Connections: Not on file  ?Intimate Partner Violence: Not on file  ? ? ?Physical Exam ?Vitals reviewed.  ?Constitutional:   ?   Appearance: Normal appearance. He is normal weight.  ?HENT:  ?

## 2021-08-14 DIAGNOSIS — F1120 Opioid dependence, uncomplicated: Secondary | ICD-10-CM | POA: Diagnosis not present

## 2021-08-14 DIAGNOSIS — F411 Generalized anxiety disorder: Secondary | ICD-10-CM | POA: Diagnosis not present

## 2021-08-14 DIAGNOSIS — F332 Major depressive disorder, recurrent severe without psychotic features: Secondary | ICD-10-CM | POA: Diagnosis not present

## 2021-08-15 DIAGNOSIS — I10 Essential (primary) hypertension: Secondary | ICD-10-CM | POA: Diagnosis not present

## 2021-08-15 DIAGNOSIS — I480 Paroxysmal atrial fibrillation: Secondary | ICD-10-CM | POA: Diagnosis not present

## 2021-08-15 DIAGNOSIS — E876 Hypokalemia: Secondary | ICD-10-CM | POA: Diagnosis not present

## 2021-08-15 DIAGNOSIS — N179 Acute kidney failure, unspecified: Secondary | ICD-10-CM | POA: Diagnosis not present

## 2021-08-15 DIAGNOSIS — Z79899 Other long term (current) drug therapy: Secondary | ICD-10-CM | POA: Diagnosis not present

## 2021-08-15 DIAGNOSIS — I2699 Other pulmonary embolism without acute cor pulmonale: Secondary | ICD-10-CM | POA: Diagnosis not present

## 2021-08-15 DIAGNOSIS — J441 Chronic obstructive pulmonary disease with (acute) exacerbation: Secondary | ICD-10-CM | POA: Diagnosis not present

## 2021-08-15 DIAGNOSIS — I890 Lymphedema, not elsewhere classified: Secondary | ICD-10-CM | POA: Diagnosis not present

## 2021-08-15 DIAGNOSIS — I5032 Chronic diastolic (congestive) heart failure: Secondary | ICD-10-CM | POA: Diagnosis not present

## 2021-08-19 ENCOUNTER — Other Ambulatory Visit (HOSPITAL_COMMUNITY): Payer: Self-pay

## 2021-08-19 NOTE — Progress Notes (Signed)
Paramedicine Encounter ? ? ? Patient ID: Charles Gray, male    DOB: 1956-01-06, 66 y.o.   MRN: [id number] ? ?Arrived for home visit for Charles Gray who reports feeling sick today with nausea and lower leg pain, redness and swelling. This is normal for him. His cardiomems is 9 today per HF clinic.  ? ?Vitals and assessment as noted. Lungs clear. Meds reviewed and confirmed. Pill box filled for one week.  ? ?Appointments reviewed and confirmed. He reports he has transportation set up for home pick up for Wound Care appointment.  ? ?Dewel reports having some breast swelling and tenderness with itching, he thinks this is related to the [facility]  ? ?I will forward to provider clinic.  ? ?I will see him in one week.  ? ?Refills- ?Inspra ?Flomax  ?Zoloft  ? ? ? ? ? ? ?Patient Care Team: ?Enid Skeens., MD as PCP - General (Family Medicine) ?Berniece Salines, DO as PCP - Cardiology (Cardiology) ?Jorge Ny, LCSW as Education officer, museum (Licensed Holiday representative) ? ?Patient Active Problem List  ? Diagnosis Date Noted  ? Dyslipidemia 07/17/2021  ? Lymphedema 07/08/2021  ? Melena   ? Gastric polyps   ? UGIB (upper gastrointestinal bleed) 04/06/2021  ? Acute encephalopathy 12/21/2020  ? COPD with acute exacerbation ([place]) 12/21/2020  ? Chronic diastolic (congestive) heart failure ([place]) 11/14/2020  ? Syncope 10/28/2020  ? CHF (congestive heart failure) ([place]) 10/28/2020  ? Chronic venous stasis dermatitis of both lower extremities 08/27/2020  ? Cellulitis of both lower extremities 08/14/2020  ? Iron deficiency anemia 08/14/2020  ? GERD without esophagitis 06/10/2020  ? Atrial fibrillation with rapid ventricular response ([place]) 03/24/2020  ? Acute on chronic right heart failure ([place]) 01/27/2020  ? Acute respiratory failure with hypoxia ([place])   ? Fatty liver disease, nonalcoholic   ? Shock ([place])   ? Personal history of DVT (deep vein thrombosis) 12/26/2019  ? Chronic anticoagulation 12/26/2019  ? Palpitations 12/26/2019  ? Paroxysmal  atrial fibrillation ([place]) 12/23/2019  ? Heart failure, systolic, acute ([place]) 65/78/4696  ? Acute on chronic congestive heart failure ([place])   ? Acute on chronic diastolic CHF (congestive heart failure) ([place]) 08/23/2019  ? Chest pain 08/23/2019  ? Lactic acidosis 08/23/2019  ? History of pulmonary embolism 08/12/2019  ? Atrial tachycardia ([place]) 08/12/2019  ? Occasional tremors 08/12/2019  ? OSA (obstructive sleep apnea) 08/12/2019  ? AKI (acute kidney injury) ([place]) 08/08/2019  ? Acute on chronic right-sided heart failure ([place]) 08/06/2019  ? Acute pulmonary embolism ([place]) 06/08/2019  ? Normocytic anemia 06/08/2019  ? Pulmonary emboli ([place]) 06/08/2019  ? Chronic heart failure with preserved ejection fraction ([place]) 05/30/2019  ? Class 2 severe obesity due to excess calories with serious comorbidity and body mass index (BMI) of 39.0 to 39.9 in adult [facility]) 05/30/2019  ? Morbid obesity with BMI of 40.0-44.9, adult ([place]) 05/30/2019  ? Hyponatremia   ? Drug induced constipation   ? Peripheral edema   ? Hypotension due to drugs   ? Hypoalbuminemia due to protein-calorie malnutrition ([place])   ? Thrombocytopenia ([place])   ? Anemia of chronic disease   ? Cirrhosis of liver without ascites ([place])   ? Chronic pain syndrome   ? Debility 03/13/2019  ? Portal hypertensive gastropathy ([place]) 03/06/2019  ? Dyspnea   ? GIB (gastrointestinal bleeding) 03/04/2019  ? Mixed hyperlipidemia 01/24/2019  ? Insomnia 01/24/2019  ? Hyperlipidemia 01/24/2019  ? Essential hypertension 01/24/2019  ? Lumbar disc herniation 01/24/2019  ? ? ?  Current Outpatient Medications:  ?  apixaban (ELIQUIS) 5 MG TABS tablet, Take 1 tablet (5 mg total) by mouth 2 (two) times daily., Disp: 60 tablet, Rfl: 0 ?  Buprenorphine HCl-Naloxone HCl 8-2 MG FILM, Place 1 tablet under the tongue 2 (two) times daily., Disp: , Rfl:  ?  eplerenone (INSPRA) 25 MG tablet, Take 4 tablets (100 mg total) by mouth daily., Disp: 120 tablet, Rfl: 0 ?  gabapentin (NEURONTIN) 800 MG tablet, Take  1 tablet (800 mg total) by mouth 3 (three) times daily., Disp: 90 tablet, Rfl: 1 ?  lactulose, encephalopathy, (CHRONULAC) 10 GM/15ML SOLN, Take 30 mLs (20 g total) by mouth 3 (three) times daily., Disp: 236 mL, Rfl: 0 ?  metolazone (ZAROXOLYN) 2.5 MG tablet, Take 1 tablet (2.5 mg total) by mouth 3 (three) times a week. Monday, Wednesday and Fridays, Disp: 14 tablet, Rfl: 0 ?  metoprolol succinate (TOPROL XL) 25 MG 24 hr tablet, Take 1 tablet (25 mg total) by mouth at bedtime., Disp: 90 tablet, Rfl: 0 ?  pantoprazole (PROTONIX) 40 MG tablet, Take 40 mg by mouth 2 (two) times daily before a meal., Disp: , Rfl:  ?  polyethylene glycol (MIRALAX / GLYCOLAX) 17 g packet, Take 17 g by mouth as needed for mild constipation (mix and drink as directed)., Disp: , Rfl:  ?  potassium chloride SA (KLOR-CON M) 20 MEQ tablet, Take 5 tablets (100 mEq total) by mouth 3 (three) times daily. Take one tablet extra (40 meq) with metolazone., Disp: 463 tablet, Rfl: 6 ?  pravastatin (PRAVACHOL) 20 MG tablet, Take 1 tablet (20 mg total) by mouth daily., Disp: 60 tablet, Rfl: 0 ?  sertraline (ZOLOFT) 50 MG tablet, TAKE 1 TABLET BY MOUTH DAILY, Disp: 30 tablet, Rfl: 3 ?  tamsulosin (FLOMAX) 0.4 MG CAPS capsule, Take 1 capsule (0.4 mg total) by mouth daily., Disp: 30 capsule, Rfl: 0 ?  torsemide (DEMADEX) 100 MG tablet, Take 1 tablet (100 mg total) by mouth twice daily., Disp: 120 tablet, Rfl: 0 ?  VENTOLIN HFA 108 (90 Base) MCG/ACT inhaler, Inhale 1 puff into the lungs every 4 (four) hours as needed for shortness of breath., Disp: 6.7 g, Rfl: 1 ?  zolpidem (AMBIEN) 10 MG tablet, Take 1 tablet (10 mg total) by mouth at bedtime as needed for sleep., Disp: 30 tablet, Rfl: 0 ?Allergies  ?Allergen Reactions  ? Penicillins Other (See Comments)  ?  Caused convulsions as a child  ?Did it involve swelling of the face/tongue/throat, SOB, or low BP? N/A ?Did it involve sudden or severe rash/hives, skin peeling, or any reaction on the inside of your  mouth or nose? N/A ?Did you need to seek medical attention at a hospital or doctor's office?N/A ?When did it last happen? N/A ?If all above answers are "NO", may proceed with cephalosporin use.  ? ? ? ?Social History  ? ?Socioeconomic History  ? Marital status: Single  ?  Spouse name: Not on file  ? Number of children: Not on file  ? Years of education: Not on file  ? Highest education level: Not on file  ?Occupational History  ? Not on file  ?Tobacco Use  ? Smoking status: Never  ? Smokeless tobacco: Never  ?Vaping Use  ? Vaping Use: Never used  ?Substance and Sexual Activity  ? Alcohol use: Not Currently  ? Drug use: Not Currently  ?  Types: Marijuana  ?  Comment: Smoked for 30 years  ? Sexual activity: Not on file  ?  Other Topics Concern  ? Not on file  ?Social History Narrative  ? Not on file  ? ?Social Determinants of Health  ? ?Financial Resource Strain: Medium Risk  ? Difficulty of Paying Living Expenses: Somewhat hard  ?Food Insecurity: No Food Insecurity  ? Worried About Charity fundraiser in the Last Year: Never true  ? Ran Out of Food in the Last Year: Never true  ?Transportation Needs: Unmet Transportation Needs  ? Lack of Transportation (Medical): Yes  ? Lack of Transportation (Non-Medical): No  ?Physical Activity: Not on file  ?Stress: Not on file  ?Social Connections: Not on file  ?Intimate Partner Violence: Not on file  ? ? ?Physical Exam ? ? ? ? ? ?Future Appointments  ?Date Time Provider [place]  ?08/21/2021  9:45 AM Valinda Party, DO [facility]  ?09/03/2021 10:00 AM [facility] PA/NP [facility] None  ?11/21/2021 10:00 AM Larey Dresser, MD [facility] None  ? ? ? ?ACTION: ?Home visit completed ? ? ? ? ? ? ?

## 2021-08-20 ENCOUNTER — Other Ambulatory Visit (HOSPITAL_COMMUNITY): Payer: Self-pay | Admitting: Cardiology

## 2021-08-21 ENCOUNTER — Encounter (HOSPITAL_BASED_OUTPATIENT_CLINIC_OR_DEPARTMENT_OTHER): Payer: Medicare HMO | Attending: Internal Medicine | Admitting: Internal Medicine

## 2021-08-21 DIAGNOSIS — I890 Lymphedema, not elsewhere classified: Secondary | ICD-10-CM | POA: Diagnosis not present

## 2021-08-21 DIAGNOSIS — I480 Paroxysmal atrial fibrillation: Secondary | ICD-10-CM | POA: Insufficient documentation

## 2021-08-21 DIAGNOSIS — Z7901 Long term (current) use of anticoagulants: Secondary | ICD-10-CM | POA: Insufficient documentation

## 2021-08-21 DIAGNOSIS — I110 Hypertensive heart disease with heart failure: Secondary | ICD-10-CM | POA: Insufficient documentation

## 2021-08-21 DIAGNOSIS — I5032 Chronic diastolic (congestive) heart failure: Secondary | ICD-10-CM | POA: Diagnosis not present

## 2021-08-21 DIAGNOSIS — I87321 Chronic venous hypertension (idiopathic) with inflammation of right lower extremity: Secondary | ICD-10-CM

## 2021-08-21 DIAGNOSIS — L97822 Non-pressure chronic ulcer of other part of left lower leg with fat layer exposed: Secondary | ICD-10-CM | POA: Insufficient documentation

## 2021-08-21 DIAGNOSIS — R6889 Other general symptoms and signs: Secondary | ICD-10-CM | POA: Diagnosis not present

## 2021-08-21 DIAGNOSIS — K7460 Unspecified cirrhosis of liver: Secondary | ICD-10-CM | POA: Diagnosis not present

## 2021-08-26 ENCOUNTER — Other Ambulatory Visit (HOSPITAL_COMMUNITY): Payer: Self-pay

## 2021-08-26 NOTE — Progress Notes (Signed)
Paramedicine Encounter ? ? ? Patient ID: Charles Gray, male    DOB: Sep 03, 1955, 66 y.o.   MRN: [id number] ? ?Arrived for home visit for Charles Gray who was seated in his recliner alert and oriented reporting to be feeling okay today. He was seen by wound clinic last week and they wrapped his legs and gave him detailed instructions as well as orders for home health wound care.  ? ?I obtained vitals and assessment. Left leg wrapped on arrival, he reports he removed the right leg wrap as it was hurting him. I instructed him the importance of keeping the wraps on as ordered to improve the swelling in his legs.  ? ?His weight today is up 8 lbs this week, he believes his scale is off by a few points. I weighed myself and it is 4lbs off from by home scale. I advised Charles Gray that he needs to continue to weigh daily. He agreed. We discussed his diet and fluid intake. We added up the ounces he drinks daily and it is around 160-180 ounces daily. I informed him that he should only be drinking 68oz everyday and that he was overhydrating which is contributing to his weight gain and consistent swelling and trouble breathing. We talked at length about this and I left written reminders on his side table to help him remember his fluid amounts he is allotted per day. He verbalized understanding. I encouraged him to be sure he is using his CardioMems daily. He reports he is with the help of his home aide.  ? ?I reviewed medications and confirmed same. I filled pill box for one week. Refills called into [facility]: Inspra, Gabapentin, Zoloft, Flomax. (These will be picked up tomorrow by his aide) ? ? ?We reviewed appointments and confirmed same. He will be seen by wound care again this week. I will follow up on Tuesday in the home. We will get HF clinic appointment moved on the 24th as he has an appointment with a doctor in [place] that day also.  ? ?We reviewed all we discussed and he understands to reach out to me if needed until our  next visit. Home visit complete.  ? ? ? ? ?Patient Care Team: ?Enid Skeens., MD as PCP - General (Family Medicine) ?Berniece Salines, DO as PCP - Cardiology (Cardiology) ?Jorge Ny, LCSW as Education officer, museum (Licensed Holiday representative) ? ?Patient Active Problem List  ? Diagnosis Date Noted  ? Dyslipidemia 07/17/2021  ? Lymphedema 07/08/2021  ? Melena   ? Gastric polyps   ? UGIB (upper gastrointestinal bleed) 04/06/2021  ? Acute encephalopathy 12/21/2020  ? COPD with acute exacerbation ([place]) 12/21/2020  ? Chronic diastolic (congestive) heart failure ([place]) 11/14/2020  ? Syncope 10/28/2020  ? CHF (congestive heart failure) ([place]) 10/28/2020  ? Chronic venous stasis dermatitis of both lower extremities 08/27/2020  ? Cellulitis of both lower extremities 08/14/2020  ? Iron deficiency anemia 08/14/2020  ? GERD without esophagitis 06/10/2020  ? Atrial fibrillation with rapid ventricular response ([place]) 03/24/2020  ? Acute on chronic right heart failure ([place]) 01/27/2020  ? Acute respiratory failure with hypoxia ([place])   ? Fatty liver disease, nonalcoholic   ? Shock ([place])   ? Personal history of DVT (deep vein thrombosis) 12/26/2019  ? Chronic anticoagulation 12/26/2019  ? Palpitations 12/26/2019  ? Paroxysmal atrial fibrillation ([place]) 12/23/2019  ? Heart failure, systolic, acute ([place]) 02/58/5277  ? Acute on chronic congestive heart failure ([place])   ? Acute on chronic diastolic CHF (  congestive heart failure) ([place]) 08/23/2019  ? Chest pain 08/23/2019  ? Lactic acidosis 08/23/2019  ? History of pulmonary embolism 08/12/2019  ? Atrial tachycardia ([place]) 08/12/2019  ? Occasional tremors 08/12/2019  ? OSA (obstructive sleep apnea) 08/12/2019  ? AKI (acute kidney injury) ([place]) 08/08/2019  ? Acute on chronic right-sided heart failure ([place]) 08/06/2019  ? Acute pulmonary embolism ([place]) 06/08/2019  ? Normocytic anemia 06/08/2019  ? Pulmonary emboli ([place]) 06/08/2019  ? Chronic heart failure with preserved ejection fraction ([place])  05/30/2019  ? Class 2 severe obesity due to excess calories with serious comorbidity and body mass index (BMI) of 39.0 to 39.9 in adult [facility]) 05/30/2019  ? Morbid obesity with BMI of 40.0-44.9, adult ([place]) 05/30/2019  ? Hyponatremia   ? Drug induced constipation   ? Peripheral edema   ? Hypotension due to drugs   ? Hypoalbuminemia due to protein-calorie malnutrition ([place])   ? Thrombocytopenia ([place])   ? Anemia of chronic disease   ? Cirrhosis of liver without ascites ([place])   ? Chronic pain syndrome   ? Debility 03/13/2019  ? Portal hypertensive gastropathy ([place]) 03/06/2019  ? Dyspnea   ? GIB (gastrointestinal bleeding) 03/04/2019  ? Mixed hyperlipidemia 01/24/2019  ? Insomnia 01/24/2019  ? Hyperlipidemia 01/24/2019  ? Essential hypertension 01/24/2019  ? Lumbar disc herniation 01/24/2019  ? ? ?Current Outpatient Medications:  ?  apixaban (ELIQUIS) 5 MG TABS tablet, Take 1 tablet (5 mg total) by mouth 2 (two) times daily., Disp: 60 tablet, Rfl: 0 ?  Buprenorphine HCl-Naloxone HCl 8-2 MG FILM, Place 1 tablet under the tongue 2 (two) times daily., Disp: , Rfl:  ?  eplerenone (INSPRA) 25 MG tablet, Take 4 tablets (100 mg total) by mouth daily., Disp: 120 tablet, Rfl: 0 ?  eplerenone (INSPRA) 50 MG tablet, TAKE 2 TABLETS BY MOUTH DAILY, Disp: 60 tablet, Rfl: 6 ?  gabapentin (NEURONTIN) 800 MG tablet, Take 1 tablet (800 mg total) by mouth 3 (three) times daily., Disp: 90 tablet, Rfl: 1 ?  lactulose, encephalopathy, (CHRONULAC) 10 GM/15ML SOLN, Take 30 mLs (20 g total) by mouth 3 (three) times daily., Disp: 236 mL, Rfl: 0 ?  metolazone (ZAROXOLYN) 2.5 MG tablet, Take 1 tablet (2.5 mg total) by mouth 3 (three) times a week. Monday, Wednesday and Fridays, Disp: 14 tablet, Rfl: 0 ?  metoprolol succinate (TOPROL XL) 25 MG 24 hr tablet, Take 1 tablet (25 mg total) by mouth at bedtime., Disp: 90 tablet, Rfl: 0 ?  pantoprazole (PROTONIX) 40 MG tablet, Take 40 mg by mouth 2 (two) times daily before a meal., Disp: , Rfl:  ?   polyethylene glycol (MIRALAX / GLYCOLAX) 17 g packet, Take 17 g by mouth as needed for mild constipation (mix and drink as directed)., Disp: , Rfl:  ?  potassium chloride SA (KLOR-CON M) 20 MEQ tablet, Take 5 tablets (100 mEq total) by mouth 3 (three) times daily. Take one tablet extra (40 meq) with metolazone., Disp: 463 tablet, Rfl: 6 ?  pravastatin (PRAVACHOL) 20 MG tablet, Take 1 tablet (20 mg total) by mouth daily., Disp: 60 tablet, Rfl: 0 ?  sertraline (ZOLOFT) 50 MG tablet, TAKE 1 TABLET BY MOUTH DAILY, Disp: 30 tablet, Rfl: 3 ?  tamsulosin (FLOMAX) 0.4 MG CAPS capsule, Take 1 capsule (0.4 mg total) by mouth daily., Disp: 30 capsule, Rfl: 0 ?  torsemide (DEMADEX) 100 MG tablet, Take 1 tablet (100 mg total) by mouth twice daily., Disp: 120 tablet, Rfl: 0 ?  VENTOLIN HFA  108 (90 Base) MCG/ACT inhaler, Inhale 1 puff into the lungs every 4 (four) hours as needed for shortness of breath., Disp: 6.7 g, Rfl: 1 ?  zolpidem (AMBIEN) 10 MG tablet, Take 1 tablet (10 mg total) by mouth at bedtime as needed for sleep., Disp: 30 tablet, Rfl: 0 ?Allergies  ?Allergen Reactions  ? Penicillins Other (See Comments)  ?  Caused convulsions as a child  ?Did it involve swelling of the face/tongue/throat, SOB, or low BP? N/A ?Did it involve sudden or severe rash/hives, skin peeling, or any reaction on the inside of your mouth or nose? N/A ?Did you need to seek medical attention at a hospital or doctor's office?N/A ?When did it last happen? N/A ?If all above answers are "NO", may proceed with cephalosporin use.  ? ? ? ?Social History  ? ?Socioeconomic History  ? Marital status: Single  ?  Spouse name: Not on file  ? Number of children: Not on file  ? Years of education: Not on file  ? Highest education level: Not on file  ?Occupational History  ? Not on file  ?Tobacco Use  ? Smoking status: Never  ? Smokeless tobacco: Never  ?Vaping Use  ? Vaping Use: Never used  ?Substance and Sexual Activity  ? Alcohol use: Not Currently  ? Drug  use: Not Currently  ?  Types: Marijuana  ?  Comment: Smoked for 30 years  ? Sexual activity: Not on file  ?Other Topics Concern  ? Not on file  ?Social History Narrative  ? Not on file  ? ?Social Determinants of Heal

## 2021-08-28 ENCOUNTER — Encounter (HOSPITAL_BASED_OUTPATIENT_CLINIC_OR_DEPARTMENT_OTHER): Payer: Medicare HMO | Admitting: Internal Medicine

## 2021-09-02 ENCOUNTER — Other Ambulatory Visit (HOSPITAL_COMMUNITY): Payer: Self-pay

## 2021-09-02 ENCOUNTER — Other Ambulatory Visit: Payer: Self-pay

## 2021-09-02 MED ORDER — TRIAMCINOLONE ACETONIDE 0.025 % EX OINT: 1.0000 "application " | TOPICAL_OINTMENT | Freq: Two times a day (BID) | CUTANEOUS | 0 refills | Status: DC

## 2021-09-02 NOTE — Progress Notes (Signed)
Paramedicine Encounter    Patient ID: Charles Gray, male    DOB: 08-03-1955, 66 y.o.   MRN: [id number]  Arrived for home visit for Charles Gray where he was alert and oriented seated in his recliner. He reports feeling tired with some shortness of breath which is normal for him as well as leg pain, redness and swelling. His legs look worse this week on evaluation. He reports he sees wound care this Thursday. I advised him to follow their instructions and we will reassess next week. He has Triamcinolone cream at home but is requesting refills. I contacted the pharmacy and they advised they do have refills on same and will get it ready for him. I instructed him to use this twice daily. He agreed.   I obtained vitals and assessment. Lungs clear. Lower leg swelling, redness and pain noted. Home health RN comes out today for leg wraps and he will see wound care this week.   He reports having some dark stools over the last week once or twice but notes no blood, I advised him to keep a check on this and to report this to me or his PCP. He agreed.   He will be seen by Suboxone doctor tomorrow in [place] at 330.  Wound care visit on Thursday at 0900 with a ride pick up at 0800.   We reviewed medications and confirmed same. I filled pill box for one week.   Refills: Pantoprazole Ambien Kenalog Cream   I encouraged Charles Gray to submit his Cardiomems daily and to reduce his fluids and sodium intakes. He is aware and agreed. Home visit complete. I will follow up in one week.    Patient Care Team: Enid Skeens., MD as PCP - General (Family Medicine) Berniece Salines, DO as PCP - Cardiology (Cardiology) Jorge Ny, LCSW as Social Worker (Licensed Clinical Social Worker)  Patient Active Problem List   Diagnosis Date Noted   Dyslipidemia 07/17/2021   Lymphedema 07/08/2021   Melena    Gastric polyps    UGIB (upper gastrointestinal bleed) 04/06/2021   Acute encephalopathy 12/21/2020   COPD with acute  exacerbation ([place]) 12/21/2020   Chronic diastolic (congestive) heart failure ([place] ) 11/14/2020   Syncope 10/28/2020   CHF (congestive heart failure) ([place]) 10/28/2020   Chronic venous stasis dermatitis of both lower extremities 08/27/2020   Cellulitis of both lower extremities 08/14/2020   Iron deficiency anemia 08/14/2020   GERD without esophagitis 06/10/2020   Atrial fibrillation with rapid ventricular response ([place]) 03/24/2020   Acute on chronic right heart failure ([place]) 01/27/2020   Acute respiratory failure with hypoxia (HCC)    Fatty liver disease, nonalcoholic    Shock ([place])    Personal history of DVT (deep vein thrombosis) 12/26/2019   Chronic anticoagulation 12/26/2019   Palpitations 12/26/2019   Paroxysmal atrial fibrillation ([place]) 12/23/2019   Heart failure, systolic, acute ([place]) 19/41/7408   Acute on chronic congestive heart failure (HCC)    Acute on chronic diastolic CHF (congestive heart failure) ([place]) 08/23/2019   Chest pain 08/23/2019   Lactic acidosis 08/23/2019   History of pulmonary embolism 08/12/2019   Atrial tachycardia ([place]) 08/12/2019   Occasional tremors 08/12/2019   OSA (obstructive sleep apnea) 08/12/2019   AKI (acute kidney injury) ([place]) 08/08/2019   Acute on chronic right-sided heart failure ([place]) 08/06/2019   Acute pulmonary embolism ([place]) 06/08/2019   Normocytic anemia 06/08/2019   Pulmonary emboli ([place]) 06/08/2019   Chronic heart failure with preserved ejection fraction ([place]) 05/30/2019  Class 2 severe obesity due to excess calories with serious comorbidity and body mass index (BMI) of 39.0 to 39.9 in adult [facility]) 05/30/2019   Morbid obesity with BMI of 40.0-44.9, adult ([place]) 05/30/2019   Hyponatremia    Drug induced constipation    Peripheral edema    Hypotension due to drugs    Hypoalbuminemia due to protein-calorie malnutrition (HCC)    Thrombocytopenia (HCC)    Anemia of chronic disease    Cirrhosis of liver without ascites (HCC)    Chronic  pain syndrome    Debility 03/13/2019   Portal hypertensive gastropathy ([place]) 03/06/2019   Dyspnea    GIB (gastrointestinal bleeding) 03/04/2019   Mixed hyperlipidemia 01/24/2019   Insomnia 01/24/2019   Hyperlipidemia 01/24/2019   Essential hypertension 01/24/2019   Lumbar disc herniation 01/24/2019    Current Outpatient Medications:    apixaban (ELIQUIS) 5 MG TABS tablet, Take 1 tablet (5 mg total) by mouth 2 (two) times daily., Disp: 60 tablet, Rfl: 0   Buprenorphine HCl-Naloxone HCl 8-2 MG FILM, Place 1 tablet under the tongue 2 (two) times daily., Disp: , Rfl:    eplerenone (INSPRA) 25 MG tablet, Take 4 tablets (100 mg total) by mouth daily., Disp: 120 tablet, Rfl: 0   eplerenone (INSPRA) 50 MG tablet, TAKE 2 TABLETS BY MOUTH DAILY, Disp: 60 tablet, Rfl: 6   gabapentin (NEURONTIN) 800 MG tablet, Take 1 tablet (800 mg total) by mouth 3 (three) times daily., Disp: 90 tablet, Rfl: 1   lactulose, encephalopathy, (CHRONULAC) 10 GM/15ML SOLN, Take 30 mLs (20 g total) by mouth 3 (three) times daily., Disp: 236 mL, Rfl: 0   metolazone (ZAROXOLYN) 2.5 MG tablet, Take 1 tablet (2.5 mg total) by mouth 3 (three) times a week. Monday, Wednesday and Fridays, Disp: 14 tablet, Rfl: 0   metoprolol succinate (TOPROL XL) 25 MG 24 hr tablet, Take 1 tablet (25 mg total) by mouth at bedtime., Disp: 90 tablet, Rfl: 0   pantoprazole (PROTONIX) 40 MG tablet, Take 40 mg by mouth 2 (two) times daily before a meal., Disp: , Rfl:    polyethylene glycol (MIRALAX / GLYCOLAX) 17 g packet, Take 17 g by mouth as needed for mild constipation (mix and drink as directed)., Disp: , Rfl:    potassium chloride SA (KLOR-CON M) 20 MEQ tablet, Take 5 tablets (100 mEq total) by mouth 3 (three) times daily. Take one tablet extra (40 meq) with metolazone., Disp: 463 tablet, Rfl: 6   pravastatin (PRAVACHOL) 20 MG tablet, Take 1 tablet (20 mg total) by mouth daily., Disp: 60 tablet, Rfl: 0   sertraline (ZOLOFT) 50 MG tablet, TAKE 1  TABLET BY MOUTH DAILY, Disp: 30 tablet, Rfl: 3   tamsulosin (FLOMAX) 0.4 MG CAPS capsule, Take 1 capsule (0.4 mg total) by mouth daily., Disp: 30 capsule, Rfl: 0   torsemide (DEMADEX) 100 MG tablet, Take 1 tablet (100 mg total) by mouth twice daily., Disp: 120 tablet, Rfl: 0   VENTOLIN HFA 108 (90 Base) MCG/ACT inhaler, Inhale 1 puff into the lungs every 4 (four) hours as needed for shortness of breath., Disp: 6.7 g, Rfl: 1   zolpidem (AMBIEN) 10 MG tablet, Take 1 tablet (10 mg total) by mouth at bedtime as needed for sleep., Disp: 30 tablet, Rfl: 0 Allergies  Allergen Reactions   Penicillins Other (See Comments)    Caused convulsions as a child  Did it involve swelling of the face/tongue/throat, SOB, or low BP? N/A Did it involve sudden or severe  rash/hives, skin peeling, or any reaction on the inside of your mouth or nose? N/A Did you need to seek medical attention at a hospital or doctor's office?N/A When did it last happen? N/A If all above answers are "NO", may proceed with cephalosporin use.     Social History   Socioeconomic History   Marital status: Single    Spouse name: Not on file   Number of children: Not on file   Years of education: Not on file   Highest education level: Not on file  Occupational History   Not on file  Tobacco Use   Smoking status: Never   Smokeless tobacco: Never  Vaping Use   Vaping Use: Never used  Substance and Sexual Activity   Alcohol use: Not Currently   Drug use: Not Currently    Types: Marijuana    Comment: Smoked for 30 years   Sexual activity: Not on file  Other Topics Concern   Not on file  Social History Narrative   Not on file   Social Determinants of Health   Financial Resource Strain: Medium Risk   Difficulty of Paying Living Expenses: Somewhat hard  Food Insecurity: No Food Insecurity   Worried About Running Out of Food in the Last Year: Never true   Ran Out of Food in the Last Year: Never true  Transportation Needs:  Unmet Transportation Needs   Lack of Transportation (Medical): Yes   Lack of Transportation (Non-Medical): No  Physical Activity: Not on file  Stress: Not on file  Social Connections: Not on file  Intimate Partner Violence: Not on file    Physical Exam      Future Appointments  Date Time Provider [place]  09/04/2021  9:00 AM Valinda Party, DO [facility]  09/24/2021  9:00 AM [facility] PA/NP [facility] None  11/21/2021 10:00 AM Larey Dresser, MD [facility] None     ACTION: Home visit completed

## 2021-09-03 ENCOUNTER — Encounter (HOSPITAL_COMMUNITY): Payer: Medicare HMO

## 2021-09-03 DIAGNOSIS — F332 Major depressive disorder, recurrent severe without psychotic features: Secondary | ICD-10-CM | POA: Diagnosis not present

## 2021-09-03 DIAGNOSIS — F1120 Opioid dependence, uncomplicated: Secondary | ICD-10-CM | POA: Diagnosis not present

## 2021-09-03 DIAGNOSIS — F411 Generalized anxiety disorder: Secondary | ICD-10-CM | POA: Diagnosis not present

## 2021-09-03 DIAGNOSIS — Z6841 Body Mass Index (BMI) 40.0 and over, adult: Secondary | ICD-10-CM | POA: Diagnosis not present

## 2021-09-04 ENCOUNTER — Encounter (HOSPITAL_BASED_OUTPATIENT_CLINIC_OR_DEPARTMENT_OTHER): Payer: Medicare HMO | Admitting: Internal Medicine

## 2021-09-04 DIAGNOSIS — F1120 Opioid dependence, uncomplicated: Secondary | ICD-10-CM | POA: Diagnosis not present

## 2021-09-09 ENCOUNTER — Other Ambulatory Visit (HOSPITAL_COMMUNITY): Payer: Self-pay

## 2021-09-09 NOTE — Progress Notes (Signed)
Paramedicine Encounter    Patient ID: Charles Gray, male    DOB: 27-May-1955, 66 y.o.   MRN: [id number]  Arrived for home visit for Charles Gray who is alert and oriented seated in his chair reporting to be feeling bad today. He complains of pain all over but mostly in his legs and lower back.   Both lower legs are red, swollen with bubbling to his skin. He is supposed to be following up with wound care next week on Tuesday. He has not been using the Kenalog cream provided, I encouraged him to do that.   He has gained 10lbs since last week. He reports this is due to him overeating and snacking on things he shouldn't be. I advised him the importance of sticking to a heart healthy diet and reducing sodium intake including pickles, peppers and sodas. He verbalized understanding. He also admits to skipping metolazone sometimes but not in the last week. Pill box was empty for the exception of a few Gabapentin's still in the NOON spot.   I reviewed all meds and confirmed same. I filled pill box for two weeks.   I stressed the importance of taking ALL meds as written and to adhere to heart healthy diet. He verbalized understanding.  He reports being in pain daily and wants to see if his PCP would refer him to a pain management specialist. I will reach out to PCP office for same.   He knows to call me if symptoms worsen to to call 911 but I stressed the importance of not missing any doses of medication as his weight has increased and his legs are more swollen this week than normal. However his legs have not been wrapped by [facility] in several days. He says she should be coming out today or tomorrow. I will continue to follow up.   Refills needed in two weeks: Torsemide Zoloft Flomax Metolazone [place], [place] 09/09/2021        Patient Care Team: Enid Skeens., MD as PCP - General (Family Medicine) Berniece Salines, DO as PCP - Cardiology (Cardiology) Jorge Ny, LCSW as Social Worker (Licensed Clinical Social Worker)  Patient Active Problem List   Diagnosis Date Noted   Dyslipidemia 07/17/2021   Lymphedema 07/08/2021   Melena    Gastric polyps    UGIB (upper gastrointestinal bleed) 04/06/2021   Acute encephalopathy 12/21/2020   COPD with acute exacerbation ([place]) 12/21/2020   Chronic diastolic (congestive) heart failure ([place]) 11/14/2020   Syncope 10/28/2020   CHF (congestive heart failure) ([place]) 10/28/2020   Chronic venous stasis dermatitis of both lower extremities 08/27/2020   Cellulitis of both lower extremities 08/14/2020   Iron deficiency anemia 08/14/2020   GERD without esophagitis 06/10/2020   Atrial fibrillation with rapid ventricular response ([place]) 03/24/2020   Acute on chronic right heart failure ([place]) 01/27/2020   Acute respiratory failure with hypoxia (HCC)    Fatty liver disease, nonalcoholic    Shock ([place])    Personal history of DVT (deep vein thrombosis) 12/26/2019   Chronic anticoagulation 12/26/2019   Palpitations 12/26/2019   Paroxysmal atrial fibrillation ([place]) 12/23/2019   Heart failure, systolic, acute ([place]) 95/62/1308   Acute on chronic congestive heart failure (HCC)    Acute on chronic diastolic CHF (congestive heart failure) ([place]) 08/23/2019   Chest pain 08/23/2019   Lactic acidosis 08/23/2019   History of pulmonary embolism 08/12/2019   Atrial tachycardia ([place]) 08/12/2019   Occasional tremors 08/12/2019   OSA (obstructive  sleep apnea) 08/12/2019   AKI (acute kidney injury) ([place]) 08/08/2019   Acute on chronic right-sided heart failure ([place]) 08/06/2019   Acute pulmonary embolism ([place]) 06/08/2019   Normocytic anemia 06/08/2019   Pulmonary emboli ([place]) 06/08/2019   Chronic heart failure with preserved ejection fraction ([place]) 05/30/2019   Class 2 severe obesity due to excess calories with serious comorbidity and body mass index (BMI) of 39.0 to 39.9 in adult [facility]) 05/30/2019   Morbid obesity with BMI of  40.0-44.9, adult ([place]) 05/30/2019   Hyponatremia    Drug induced constipation    Peripheral edema    Hypotension due to drugs    Hypoalbuminemia due to protein-calorie malnutrition (HCC)    Thrombocytopenia (HCC)    Anemia of chronic disease    Cirrhosis of liver without ascites (HCC)    Chronic pain syndrome    Debility 03/13/2019   Portal hypertensive gastropathy ([place]) 03/06/2019   Dyspnea    GIB (gastrointestinal bleeding) 03/04/2019   Mixed hyperlipidemia 01/24/2019   Insomnia 01/24/2019   Hyperlipidemia 01/24/2019   Essential hypertension 01/24/2019   Lumbar disc herniation 01/24/2019    Current Outpatient Medications:    apixaban (ELIQUIS) 5 MG TABS tablet, Take 1 tablet (5 mg total) by mouth 2 (two) times daily., Disp: 60 tablet, Rfl: 0   Buprenorphine HCl-Naloxone HCl 8-2 MG FILM, Place 1 tablet under the tongue 2 (two) times daily., Disp: , Rfl:    eplerenone (INSPRA) 25 MG tablet, Take 4 tablets (100 mg total) by mouth daily. (Patient not taking: Reported on 09/02/2021), Disp: 120 tablet, Rfl: 0   eplerenone (INSPRA) 50 MG tablet, TAKE 2 TABLETS BY MOUTH DAILY, Disp: 60 tablet, Rfl: 6   gabapentin (NEURONTIN) 800 MG tablet, Take 1 tablet (800 mg total) by mouth 3 (three) times daily., Disp: 90 tablet, Rfl: 1   lactulose, encephalopathy, (CHRONULAC) 10 GM/15ML SOLN, Take 30 mLs (20 g total) by mouth 3 (three) times daily., Disp: 236 mL, Rfl: 0   metolazone (ZAROXOLYN) 2.5 MG tablet, Take 1 tablet (2.5 mg total) by mouth 3 (three) times a week. Monday, Wednesday and Fridays, Disp: 14 tablet, Rfl: 0   metoprolol succinate (TOPROL XL) 25 MG 24 hr tablet, Take 1 tablet (25 mg total) by mouth at bedtime., Disp: 90 tablet, Rfl: 0   pantoprazole (PROTONIX) 40 MG tablet, Take 40 mg by mouth 2 (two) times daily before a meal., Disp: , Rfl:    polyethylene glycol (MIRALAX / GLYCOLAX) 17 g packet, Take 17 g by mouth as needed for mild constipation (mix and drink as directed)., Disp: ,  Rfl:    potassium chloride SA (KLOR-CON M) 20 MEQ tablet, Take 5 tablets (100 mEq total) by mouth 3 (three) times daily. Take one tablet extra (40 meq) with metolazone., Disp: 463 tablet, Rfl: 6   pravastatin (PRAVACHOL) 20 MG tablet, Take 1 tablet (20 mg total) by mouth daily., Disp: 60 tablet, Rfl: 0   sertraline (ZOLOFT) 50 MG tablet, TAKE 1 TABLET BY MOUTH DAILY, Disp: 30 tablet, Rfl: 3   tamsulosin (FLOMAX) 0.4 MG CAPS capsule, Take 1 capsule (0.4 mg total) by mouth daily., Disp: 30 capsule, Rfl: 0   torsemide (DEMADEX) 100 MG tablet, Take 1 tablet (100 mg total) by mouth twice daily., Disp: 120 tablet, Rfl: 0   triamcinolone (KENALOG) 0.025 % ointment, Apply 1 application. topically 2 (two) times daily., Disp: 30 g, Rfl: 0   VENTOLIN HFA 108 (90 Base) MCG/ACT inhaler, Inhale 1 puff into the lungs  every 4 (four) hours as needed for shortness of breath., Disp: 6.7 g, Rfl: 1   zolpidem (AMBIEN) 10 MG tablet, Take 1 tablet (10 mg total) by mouth at bedtime as needed for sleep., Disp: 30 tablet, Rfl: 0 Allergies  Allergen Reactions   Penicillins Other (See Comments)    Caused convulsions as a child  Did it involve swelling of the face/tongue/throat, SOB, or low BP? N/A Did it involve sudden or severe rash/hives, skin peeling, or any reaction on the inside of your mouth or nose? N/A Did you need to seek medical attention at a hospital or doctor's office?N/A When did it last happen? N/A If all above answers are "NO", may proceed with cephalosporin use.     Social History   Socioeconomic History   Marital status: Single    Spouse name: Not on file   Number of children: Not on file   Years of education: Not on file   Highest education level: Not on file  Occupational History   Not on file  Tobacco Use   Smoking status: Never   Smokeless tobacco: Never  Vaping Use   Vaping Use: Never used  Substance and Sexual Activity   Alcohol use: Not Currently   Drug use: Not Currently     Types: Marijuana    Comment: Smoked for 30 years   Sexual activity: Not on file  Other Topics Concern   Not on file  Social History Narrative   Not on file   Social Determinants of Health   Financial Resource Strain: Medium Risk   Difficulty of Paying Living Expenses: Somewhat hard  Food Insecurity: No Food Insecurity   Worried About Running Out of Food in the Last Year: Never true   Ran Out of Food in the Last Year: Never true  Transportation Needs: Unmet Transportation Needs   Lack of Transportation (Medical): Yes   Lack of Transportation (Non-Medical): No  Physical Activity: Not on file  Stress: Not on file  Social Connections: Not on file  Intimate Partner Violence: Not on file    Physical Exam      Future Appointments  Date Time Provider [place]  09/16/2021  9:00 AM Valinda Party, DO [facility]  09/24/2021  9:00 AM [facility] PA/NP [facility] None  11/21/2021 10:00 AM Larey Dresser, MD [facility] None     ACTION: Home visit completed

## 2021-09-16 ENCOUNTER — Telehealth (HOSPITAL_COMMUNITY): Payer: Self-pay

## 2021-09-16 ENCOUNTER — Encounter (HOSPITAL_BASED_OUTPATIENT_CLINIC_OR_DEPARTMENT_OTHER): Payer: Medicare HMO | Admitting: Internal Medicine

## 2021-09-16 NOTE — Telephone Encounter (Signed)
Received a call from [facility] aide who goes out to see him daily M-F in the home that she had received some random text messages from [facility] that we're not making sense. She says she notices him being more confused lately and being depressed more often. She states that she plans to go out to see him today at 1:00 and will report to me how he is doing. I reached out to him via phone and he reports he feels bad and is confused but is able to answer all my questions. I asked if he feels similar to how he did when his ammonia levels we're elevated and he said, "I am not sure". I advised him and his aide Joycelyn Schmid to call his PCP or have him further evaluated if they felt necessary. Joycelyn Schmid reports she will update me once she see's him and they make a decision. Call complete.   -Follow up in home planned for next Tuesday morning.

## 2021-09-18 ENCOUNTER — Telehealth (HOSPITAL_COMMUNITY): Payer: Self-pay

## 2021-09-18 NOTE — Telephone Encounter (Signed)
Contacted [facility] to request the following for refills:  Torsemide Zoloft Flomax Eliquis metolazone   Charles Gray, Charles Gray 09/18/21

## 2021-09-19 ENCOUNTER — Other Ambulatory Visit (HOSPITAL_COMMUNITY): Payer: Self-pay

## 2021-09-19 MED ORDER — METOLAZONE 2.5 MG PO TABS
2.5000 mg | ORAL_TABLET | ORAL | 0 refills | Status: DC
Start: 2021-09-19 — End: 2021-11-18

## 2021-09-22 ENCOUNTER — Encounter (HOSPITAL_COMMUNITY): Payer: Medicare HMO

## 2021-09-23 ENCOUNTER — Other Ambulatory Visit (HOSPITAL_COMMUNITY): Payer: Self-pay

## 2021-09-23 NOTE — Progress Notes (Signed)
MACALLISTER, ASHMEAD ([id number]) Visit Report for 08/21/2021 Allergy List Details Patient Name: Date of Service: [name] 08/21/2021 9:45 A M Medical Record Number: [id number] Patient Account Number: [id number] Date of Birth/Sex: Treating RN: [birth year] (66 y.o. Male) Deon Pilling Primary Care Torrin Frein: Cecille [place] Other Clinician: Referring Terion Hedman: Treating Laval Cafaro/Extender: Yaakov Guthrie in Treatment: 0 Allergies Active Allergies penicillin Allergy Notes Electronic Signature(s) Signed: 09/23/2021 8:46:54 AM By: Erenest Blank Entered By: Erenest Blank on 08/21/2021 10:04:16 -------------------------------------------------------------------------------- Arrival Information Details Patient Name: Date of Service: [name] 08/21/2021 9:45 A M Medical Record Number: [id number] Patient Account Number: [id number] Date of Birth/Sex: Treating RN: [birth year] (66 y.o. Male) Deon Pilling Primary Care Brayton Baumgartner: Cecille Amsterdam Other Clinician: Referring Kemba Hoppes: Treating Kelsie Zaborowski/Extender: Yaakov Guthrie in Treatment: 0 Visit Information Patient Arrived: Charlyn Minerva Time: 09:59 Accompanied By: self Transfer Assistance: None Patient Identification Verified: Yes Secondary Verification Process Completed: Yes Patient Requires Transmission-Based Precautions: No Patient Has Alerts: Yes Patient Alerts: Patient on Blood Thinner History Since Last Visit Electronic Signature(s) Signed: 09/23/2021 8:46:54 AM By: Erenest Blank Entered By: Erenest Blank on 08/21/2021 10:01:08 -------------------------------------------------------------------------------- Clinic Level of Care Assessment Details Patient Name: Date of Service: [name] 08/21/2021 9:45 A M Medical Record Number: [id number] Patient Account Number: [id number] Date of Birth/Sex: Treating RN: 1955-06-24 (65 y.o. Male) Deon Pilling Primary Care Albi Rappaport: Cecille Amsterdam Other  Clinician: Referring Lailyn Appelbaum: Treating Zarek Relph/Extender: Yaakov Guthrie in Treatment: 0 Clinic Level of Care Assessment Items TOOL 1 Quantity Score X- 1 0 Use when EandM and Procedure is performed on INITIAL visit ASSESSMENTS - Nursing Assessment / Reassessment X- 1 20 General Physical Exam (combine w/ comprehensive assessment (listed just below) when performed on new pt. evals) X- 1 25 Comprehensive Assessment (HX, ROS, Risk Assessments, Wounds Hx, etc.) ASSESSMENTS - Wound and Skin Assessment / Reassessment []  - 0 Dermatologic / Skin Assessment (not related to wound area) ASSESSMENTS - Ostomy and/or Continence Assessment and Care []  - 0 Incontinence Assessment and Management []  - 0 Ostomy Care Assessment and Management (repouching, etc.) PROCESS - Coordination of Care X - Simple Patient / Family Education for ongoing care 1 15 []  - 0 Complex (extensive) Patient / Family Education for ongoing care X- 1 10 Staff obtains Programmer, systems, Records, T Results / Process Orders est X- 1 10 Staff telephones HHA, Nursing Homes / Clarify orders / etc []  - 0 Routine Transfer to another Facility (non-emergent condition) []  - 0 Routine Hospital Admission (non-emergent condition) X- 1 15 New Admissions / Biomedical engineer / Ordering NPWT Apligraf, etc. , []  - 0 Emergency Hospital Admission (emergent condition) PROCESS - Special Needs []  - 0 Pediatric / Minor Patient Management []  - 0 Isolation Patient Management []  - 0 Hearing / Language / Visual special needs []  - 0 Assessment of Community assistance (transportation, D/C planning, etc.) []  - 0 Additional assistance / Altered mentation []  - 0 Support Surface(s) Assessment (bed, cushion, seat, etc.) INTERVENTIONS - Miscellaneous []  - 0 External ear exam []  - 0 Patient Transfer (multiple staff / Civil Service fast streamer / Similar devices) []  - 0 Simple Staple / Suture removal (25 or less) []  - 0 Complex Staple / Suture  removal (26 or more) []  - 0 Hypo/Hyperglycemic Management (do not check if billed separately) X- 1 15 Ankle / Brachial Index (ABI) - do not check if billed separately Has the patient been seen at the hospital within the last three years: Yes Total Score: 110 Level Of Care:  New/Established - Level 3 Electronic Signature(s) Signed: 08/21/2021 4:47:21 PM By: Deon Pilling RN, BSN Entered By: Deon Pilling on 08/21/2021 11:22:08 -------------------------------------------------------------------------------- Compression Therapy Details Patient Name: Date of Service: [name] 08/21/2021 9:45 A M Medical Record Number: [id number] Patient Account Number: [id number] Date of Birth/Sex: Treating RN: 11/03/1955 (66 y.o. Male) Deon Pilling Primary Care Naiyah Klostermann: Cecille [place] Other Clinician: Referring Henryk Ursin: Treating Yobany Vroom/Extender: Yaakov Guthrie in Treatment: 0 Compression Therapy Performed for Wound Assessment: Wound #3 Left,Lateral Lower Leg Performed By: Clinician Deon Pilling, RN Compression Type: Three Layer Post Procedure Diagnosis Same as Pre-procedure Electronic Signature(s) Signed: 08/21/2021 4:47:21 PM By: Deon Pilling RN, BSN Entered By: Deon Pilling on 08/21/2021 11:13:50 -------------------------------------------------------------------------------- Encounter Discharge Information Details Patient Name: Date of Service: [name] 08/21/2021 9:45 A M Medical Record Number: [id number] Patient Account Number: [id number] Date of Birth/Sex: Treating RN: [birth year] (66 y.o. Male) Deon Pilling Primary Care Mattilynn Forrer: Cecille [place] Other Clinician: Referring Lulie Hurd: Treating Kassim Guertin/Extender: Yaakov Guthrie in Treatment: 0 Encounter Discharge Information Items Post Procedure Vitals Discharge Condition: Stable Temperature (F): 98.6 Ambulatory Status: Wheelchair Pulse (bpm): 80 Discharge Destination: Home Respiratory Rate  (breaths/min): 20 Transportation: Private Auto Blood Pressure (mmHg): 126/74 Accompanied By: self Schedule Follow-up Appointment: Yes Clinical Summary of Care: Electronic Signature(s) Signed: 09/23/2021 8:46:54 AM By: Erenest Blank Entered By: Erenest Blank on 08/21/2021 12:42:11 -------------------------------------------------------------------------------- Lower Extremity Assessment Details Patient Name: Date of Service: [name] 08/21/2021 9:45 A M Medical Record Number: [id number] Patient Account Number: [id number] Date of Birth/Sex: Treating RN: May 08, 1955 (65 y.o. Male) Deon Pilling Primary Care Isidor Bromell: Cecille Amsterdam Other Clinician: Referring Delmon Andrada: Treating Alias Villagran/Extender: Yaakov Guthrie in Treatment: 0 Edema Assessment Assessed: [Left: Yes] [Right: Yes] Edema: [Left: Yes] [Right: Yes] Calf Left: Right: Point of Measurement: 48 cm From Medial Instep 55.5 cm 55.3 cm Ankle Left: Right: Point of Measurement: 14 cm From Medial Instep 33.4 cm 34.5 cm Knee To Floor Left: Right: From Medial Instep 53 cm 54 cm Vascular Assessment Pulses: Dorsalis Pedis Palpable: [Left:Yes] [Right:Yes] Doppler Audible: [Left:Yes] [Right:Yes] Posterior Tibial Palpable: [Left:Yes] [Right:Yes] Doppler Audible: [Left:Yes] [Right:Yes] Blood Pressure: Brachial: [Left:126] [Right:126] Ankle: [Left:Dorsalis Pedis: 170 1.35] [Right:Dorsalis Pedis: 122 0.97] Electronic Signature(s) Signed: 08/21/2021 4:47:21 PM By: Deon Pilling RN, BSN Entered By: Deon Pilling on 08/21/2021 10:48:04 -------------------------------------------------------------------------------- Multi Wound Chart Details Patient Name: Date of Service: [name], [name] 08/21/2021 9:45 A M Medical Record Number: [id number] Patient Account Number: [id number] Date of Birth/Sex: Treating RN: Jun 21, 1955 (65 y.o. Male) Deon Pilling Primary Care Elgin Carn: Cecille Amsterdam Other  Clinician: Referring Hardy Harcum: Treating Dace Denn/Extender: Yaakov Guthrie in Treatment: 0 Vital Signs Height(in): 82 Pulse(bpm): 80 Weight(lbs): 383 Blood Pressure(mmHg): 126/74 Body Mass Index(BMI): 40 Temperature(F): 98.6 Respiratory Rate(breaths/min): 20 Photos: [N/A:N/A] Left, Lateral Lower Leg N/A N/A Wound Location: Gradually Appeared N/A N/A Wounding Event: Lymphedema N/A N/A Primary Etiology: Anemia, Chronic Obstructive N/A N/A Comorbid History: Pulmonary Disease (COPD), Sleep Apnea, Angina, Arrhythmia, Congestive Heart Failure, Deep Vein Thrombosis, Hypertension, Cirrhosis , Neuropathy 04/23/2021 N/A N/A Date Acquired: 0 N/A N/A Weeks of Treatment: Open N/A N/A Wound Status: No N/A N/A Wound Recurrence: 2.2x2.4x0.1 N/A N/A Measurements L x W x D (cm) 4.147 N/A N/A A (cm) : rea 0.415 N/A N/A Volume (cm) : 0.00% N/A N/A % Reduction in A rea: 0.00% N/A N/A % Reduction in Volume: Full Thickness Without Exposed N/A N/A Classification: Support Structures Medium N/A N/A Exudate A mount: Serosanguineous N/A N/A Exudate Type: red,  brown N/A N/A Exudate Color: Distinct, outline attached N/A N/A Wound Margin: Small (1-33%) N/A N/A Granulation A mount: Red, Pink N/A N/A Granulation Quality: Large (67-100%) N/A N/A Necrotic A mount: Fat Layer (Subcutaneous Tissue): Yes N/A N/A Exposed Structures: Fascia: No Tendon: No Muscle: No Joint: No Bone: No Small (1-33%) N/A N/A Epithelialization: Debridement - Excisional N/A N/A Debridement: Pre-procedure Verification/Time Out 11:05 N/A N/A Taken: Lidocaine 5% topical ointment N/A N/A Pain Control: Subcutaneous, Slough N/A N/A Tissue Debrided: Skin/Subcutaneous Tissue N/A N/A Level: 5.28 N/A N/A Debridement A (sq cm): rea Curette N/A N/A Instrument: Minimum N/A N/A Bleeding: Pressure N/A N/A Hemostasis A chieved: 0 N/A N/A Procedural Pain: 0 N/A N/A Post Procedural  Pain: Procedure was tolerated well N/A N/A Debridement Treatment Response: 2.2x2.4x0.1 N/A N/A Post Debridement Measurements L x W x D (cm) 0.415 N/A N/A Post Debridement Volume: (cm) Compression Therapy N/A N/A Procedures Performed: Debridement Treatment Notes Electronic Signature(s) Signed: 08/21/2021 12:56:45 PM By: Kalman Shan DO Signed: 08/21/2021 4:47:21 PM By: Deon Pilling RN, BSN Entered By: Kalman Shan on 08/21/2021 12:22:34 -------------------------------------------------------------------------------- Multi-Disciplinary Care Plan Details Patient Name: Date of Service: [name], [name] 08/21/2021 9:45 A M Medical Record Number: [id number] Patient Account Number: [id number] Date of Birth/Sex: Treating RN: 1955/11/29 (66 y.o. Male) Deon Pilling Primary Care Leeandre Nordling: Cecille Amsterdam Other Clinician: Referring Jahkeem Kurka: Treating Simona Rocque/Extender: Yaakov Guthrie in Treatment: 0 Active Inactive Orientation to the Wound Care Program Nursing Diagnoses: Knowledge deficit related to the wound healing center program Goals: Patient/caregiver will verbalize understanding of the [facility] Date Initiated: 08/21/2021 Target Resolution Date: 09/18/2021 Goal Status: Active Interventions: Provide education on orientation to the wound center Notes: Pain, Acute or Chronic Nursing Diagnoses: Pain, acute or chronic: actual or potential Goals: Patient will verbalize adequate pain control and receive pain control interventions during procedures as needed Date Initiated: 08/21/2021 Target Resolution Date: 09/18/2021 Goal Status: Active Interventions: Provide education on pain management Reposition patient for comfort Treatment Activities: Administer pain control measures as ordered : 08/21/2021 Notes: Wound/Skin Impairment Nursing Diagnoses: Knowledge deficit related to ulceration/compromised skin integrity Goals: Patient/caregiver will  verbalize understanding of skin care regimen Date Initiated: 08/21/2021 Target Resolution Date: 09/18/2021 Goal Status: Active Interventions: Assess patient/caregiver ability to perform ulcer/skin care regimen upon admission and as needed Assess ulceration(s) every visit Provide education on ulcer and skin care Treatment Activities: Skin care regimen initiated : 08/21/2021 Topical wound management initiated : 08/21/2021 Notes: Electronic Signature(s) Signed: 08/21/2021 4:47:21 PM By: Deon Pilling RN, BSN Entered By: Deon Pilling on 08/21/2021 11:11:29 -------------------------------------------------------------------------------- Non-Wound Condition Assessment Details Patient Name: Date of Service: [name] 08/21/2021 9:45 A M Medical Record Number: [id number] Patient Account Number: [id number] Date of Birth/Sex: Treating RN: [birth year] (66 y.o. Male) Deon Pilling Primary Care Whitlee Sluder: Cecille Amsterdam Other Clinician: Referring Phynix Horton: Treating Kinnley Paulson/Extender: Yaakov Guthrie in Treatment: 0 Non-Wound Condition: Condition: Lymphedema Location: Leg Side: Right Notes gobblestone appearance to right leg. Electronic Signature(s) Signed: 08/21/2021 4:47:21 PM By: Deon Pilling RN, BSN Entered By: Deon Pilling on 08/21/2021 11:13:12 -------------------------------------------------------------------------------- Pain Assessment Details Patient Name: Date of Service: [name] 08/21/2021 9:45 A M Medical Record Number: [id number] Patient Account Number: [id number] Date of Birth/Sex: Treating RN: October 03, 1955 (65 y.o. Male) Deon Pilling Primary Care Tarika Mckethan: Cecille Amsterdam Other Clinician: Referring Genova Kiner: Treating Toni Demo/Extender: Yaakov Guthrie in Treatment: 0 Active Problems Location of Pain Severity and Description of Pain Patient Has Paino Yes Site Locations Pain Location: Generalized Pain,  Pain in Ulcers Duration of the  Pain. Constant / Intermittento Constant Rate the pain. Current Pain Level: 7 Character of Pain Describe the Pain: Burning, Stabbing Pain Management and Medication Current Pain Management: Rest: Yes Electronic Signature(s) Signed: 08/21/2021 4:47:21 PM By: Deon Pilling RN, BSN Signed: 09/23/2021 8:46:54 AM By: Erenest Blank Entered By: Erenest Blank on 08/21/2021 10:22:13 -------------------------------------------------------------------------------- Patient/Caregiver Education Details Patient Name: Date of Service: TA Esaw Dace RD 5/11/2023andnbsp9:45 A M Medical Record Number: [id number] Patient Account Number: [id number] Date of Birth/Gender: Treating RN: 28-Feb-1956 (66 y.o. Male) Deon Pilling Primary Care Physician: Cecille Amsterdam Other Clinician: Referring Physician: Treating Physician/Extender: Yaakov Guthrie in Treatment: 0 Education Assessment Education Provided To: Patient Education Topics Provided Wound/Skin Impairment: Handouts: Skin Care Do's and Dont's Methods: Explain/Verbal Responses: Reinforcements needed Electronic Signature(s) Signed: 08/21/2021 4:47:21 PM By: Deon Pilling RN, BSN Entered By: Deon Pilling on 08/21/2021 11:11:37 -------------------------------------------------------------------------------- Wound Assessment Details Patient Name: Date of Service: [name] 08/21/2021 9:45 A M Medical Record Number: [id number] Patient Account Number: [id number] Date of Birth/Sex: Treating RN: May 02, 1955 (65 y.o. Male) Deon Pilling Primary Care Rolly Magri: Cecille Amsterdam Other Clinician: Referring Tache Bobst: Treating Lavergne Hiltunen/Extender: Yaakov Guthrie in Treatment: 0 Wound Status Wound Number: 3 Primary Lymphedema Etiology: Wound Location: Left, Lateral Lower Leg Wound Open Wounding Event: Gradually Appeared Status: Date Acquired: 04/23/2021 Comorbid Anemia, Chronic Obstructive Pulmonary Disease (COPD), Sleep Weeks Of  Treatment: 0 History: Apnea, Angina, Arrhythmia, Congestive Heart Failure, Deep Vein Clustered Wound: No Thrombosis, Hypertension, Cirrhosis , Neuropathy Photos Wound Measurements Length: (cm) 2.2 Width: (cm) 2.4 Depth: (cm) 0.1 Area: (cm) 4.147 Volume: (cm) 0.415 % Reduction in Area: 0% % Reduction in Volume: 0% Epithelialization: Small (1-33%) Tunneling: No Undermining: No Wound Description Classification: Full Thickness Without Exposed Support Structu Wound Margin: Distinct, outline attached Exudate Amount: Medium Exudate Type: Serosanguineous Exudate Color: red, brown Wound Bed Granulation Amount: Small (1-33%) Granulation Quality: Red, Pink Necrotic Amount: Large (67-100%) Necrotic Quality: Adherent Slough res [employer] Odor After Cleansing: No Slough/Fibrino Yes Exposed Structure Fascia Exposed: No Fat Layer (Subcutaneous Tissue) Exposed: Yes Tendon Exposed: No Muscle Exposed: No Joint Exposed: No Bone Exposed: No Treatment Notes Wound #3 (Lower Leg) Wound Laterality: Left, Lateral Cleanser Soap and Water Discharge Instruction: May shower and wash wound with dial antibacterial soap and water prior to dressing change. Wound Cleanser Discharge Instruction: Cleanse the wound with wound cleanser prior to applying a clean dressing using gauze sponges, not tissue or cotton balls. Peri-Wound Care Sween Lotion (Moisturizing lotion) Discharge Instruction: Apply moisturizing lotion as directed Topical Primary Dressing KerraCel Ag Gelling Fiber Dressing, 4x5 in (silver alginate) Discharge Instruction: Apply silver alginate to wound bed as instructed Secondary Dressing Woven Gauze Sponge, Non-Sterile 4x4 in Discharge Instruction: Apply over primary dressing as directed. Secured With Compression Wrap ThreePress (3 layer compression wrap) Discharge Instruction: Apply three layer compression as directed. Compression Stockings Add-Ons Electronic Signature(s) Signed:  08/21/2021 4:47:21 PM By: Deon Pilling RN, BSN Entered By: Deon Pilling on 08/21/2021 10:46:18 -------------------------------------------------------------------------------- Vitals Details Patient Name: Date of Service: [name], [name] 08/21/2021 9:45 A M Medical Record Number: [id number] Patient Account Number: [id number] Date of Birth/Sex: Treating RN: [birth year] (66 y.o. Male) Deon Pilling Primary Care Maryclare Nydam: Cecille Amsterdam Other Clinician: Referring Lugene Hitt: Treating Blanch Stang/Extender: Yaakov Guthrie in Treatment: 0 Vital Signs Time Taken: 10:01 Temperature (F): 98.6 Height (in): 82 Pulse (bpm): 80 Source: Stated Respiratory Rate (breaths/min): 20 Weight (lbs): 383 Blood Pressure (mmHg): 126/74 Source: Stated Reference Range: 80 -  120 mg / dl Body Mass Index (BMI): 40 Electronic Signature(s) Signed: 09/23/2021 8:46:54 AM By: Erenest Blank Entered By: Erenest Blank on 08/21/2021 10:03:59

## 2021-09-23 NOTE — Progress Notes (Signed)
Charles Gray, Charles Gray ([id number]) Visit Report for 08/21/2021 Abuse Risk Screen Details Patient Name: Date of Service: Charles Gray 08/21/2021 9:45 A M Medical Record Number: [id number] Patient Account Number: [id number] Date of Birth/Sex: Treating RN: 1956/01/26 (66 y.o. Male) Charles Gray Primary Care Charles Gray: Charles Gray Other Clinician: Referring Charles Gray: Treating Charles Gray/Extender: Charles Gray in Treatment: 0 Abuse Risk Screen Items Answer ABUSE RISK SCREEN: Has anyone close to you tried to hurt or harm you recentlyo No Do you feel uncomfortable with anyone in your familyo No Has anyone forced you do things that you didnt want to doo No Electronic Signature(s) Signed: 08/21/2021 4:47:21 PM By: Charles Pilling RN, BSN Signed: 09/23/2021 8:46:54 AM By: Charles Gray Entered By: Charles Gray on 08/21/2021 10:13:06 -------------------------------------------------------------------------------- Activities of Daily Living Details Patient Name: Date of Service: Charles Gray 08/21/2021 9:45 A M Medical Record Number: [id number] Patient Account Number: [id number] Date of Birth/Sex: Treating RN: September 14, 1955 (66 y.o. Male) Charles Gray Primary Care Charles Gray: Charles Gray Other Clinician: Referring Charles Gray: Treating Sakira Dahmer/Extender: Charles Gray in Treatment: 0 Activities of Daily Living Items Answer Activities of Daily Living (Please select one for each item) Drive Automobile Not Able T Medications ake Completely Able Use T elephone Completely Able Care for Appearance Completely Able Use T oilet Completely Able Bath / Shower Need Assistance Dress Self Need Assistance Feed Self Completely Able Walk Need Assistance Get In / Out Bed Completely Able Housework Need Assistance Prepare Meals Need Assistance Handle Money Need Assistance Shop for Self Need Assistance Electronic Signature(s) Signed: 08/21/2021 4:47:21 PM By: Charles Pilling RN,  BSN Signed: 09/23/2021 8:46:54 AM By: Charles Gray Entered By: Charles Gray on 08/21/2021 10:14:32 -------------------------------------------------------------------------------- Education Screening Details Patient Name: Date of Service: Charles Gray, [name] 08/21/2021 9:45 A M Medical Record Number: [id number] Patient Account Number: [id number] Date of Birth/Sex: Treating RN: 1956-01-21 (66 y.o. Male) Charles Gray Primary Care Yuridiana Formanek: Charles Gray Other Clinician: Referring Zamia Tyminski: Treating Lenix Benoist/Extender: Charles Gray in Treatment: 0 Primary Learner Assessed: Patient Learning Preferences/Education Level/Primary Language Learning Preference: Explanation, Demonstration, Printed Material Highest Education Level: High School Preferred Language: English Cognitive Barrier Language Barrier: No Translator Needed: No Memory Deficit: No Emotional Barrier: No Cultural/Religious Beliefs Affecting Medical Care: No Physical Barrier Impaired Vision: Yes Glasses, reading glasses Impaired Hearing: No Decreased Hand dexterity: No Knowledge/Comprehension Knowledge Level: High Comprehension Level: High Ability to understand written instructions: High Ability to understand verbal instructions: High Motivation Anxiety Level: Calm Cooperation: Cooperative Education Importance: Acknowledges Need Interest in Health Problems: Asks Questions Perception: Coherent Willingness to Engage in Self-Management High Activities: Readiness to Engage in Self-Management High Activities: Electronic Signature(s) Signed: 08/21/2021 4:47:21 PM By: Charles Pilling RN, BSN Signed: 09/23/2021 8:46:54 AM By: Charles Gray Entered By: Charles Gray on 08/21/2021 10:15:49 -------------------------------------------------------------------------------- Fall Risk Assessment Details Patient Name: Date of Service: [name], [name] 08/21/2021 9:45 A M Medical Record Number: [id number] Patient  Account Number: [id number] Date of Birth/Sex: Treating RN: 03/30/1956 (66 y.o. Male) Charles Gray Primary Care Merced Hanners: Charles Gray Other Clinician: Referring Grainne Knights: Treating Zamaria Brazzle/Extender: Charles Gray in Treatment: 0 Fall Risk Assessment Items Have you had 2 or more falls in the last 12 monthso 0 No Have you had any fall that resulted in injury in the last 12 monthso 0 Yes FALLS RISK SCREEN History of falling - immediate or within 3 months 0 No Secondary diagnosis (Do you have 2 or more medical diagnoseso) 0 No Ambulatory aid None/bed rest/wheelchair/nurse 0 No  Crutches/cane/walker 15 Yes Furniture 0 No Intravenous therapy Access/Saline/Heparin Lock 0 No Gait/Transferring Normal/ bed rest/ wheelchair 0 No Weak (short steps with or without shuffle, stooped but able to lift head while walking, may seek 10 Yes support from furniture) Impaired (short steps with shuffle, may have difficulty arising from chair, head down, impaired 0 No balance) Mental Status Oriented to own ability 0 Yes Electronic Signature(s) Signed: 08/21/2021 4:47:21 PM By: Charles Pilling RN, BSN Signed: 09/23/2021 8:46:54 AM By: Charles Gray Entered By: Charles Gray on 08/21/2021 10:17:13 -------------------------------------------------------------------------------- Foot Assessment Details Patient Name: Date of Service: Charles Gray 08/21/2021 9:45 A M Medical Record Number: [id number] Patient Account Number: [id number] Date of Birth/Sex: Treating RN: 14-Jun-1955 (66 y.o. Male) Charles Gray Primary Care Lydiana Milley: Charles Gray Other Clinician: Referring Godson Pollan: Treating Allanna Bresee/Extender: Charles Gray in Treatment: 0 Foot Assessment Items Site Locations + = Sensation present, - = Sensation absent, C = Callus, U = Ulcer R = Redness, W = Warmth, M = Maceration, PU = Pre-ulcerative lesion F = Fissure, S = Swelling, D = Dryness Assessment Right: Left: Other  Deformity: No No Prior Foot Ulcer: No No Prior Amputation: No No Charcot Joint: No No Ambulatory Status: Ambulatory With Help Assistance Device: Walker Gait: Steady Electronic Signature(s) Signed: 08/21/2021 4:47:21 PM By: Charles Pilling RN, BSN Entered By: Charles Gray on 08/21/2021 10:50:03 -------------------------------------------------------------------------------- Nutrition Risk Screening Details Patient Name: Date of Service: Charles Gray 08/21/2021 9:45 A M Medical Record Number: [id number] Patient Account Number: [id number] Date of Birth/Sex: Treating RN: Mar 10, 1956 (66 y.o. Male) Charles Gray Primary Care Aizza Santiago: Charles Gray Other Clinician: Referring Larsen Zettel: Treating Jazalyn Mondor/Extender: Charles Gray in Treatment: 0 Height (in): 82 Weight (lbs): 383 Body Mass Index (BMI): 40 Nutrition Risk Screening Items Score Screening NUTRITION RISK SCREEN: I have an illness or condition that made me change the kind and/or amount of food I eat 0 No I eat fewer than two meals per day 0 No I eat few fruits and vegetables, or milk products 2 Yes I have three or more drinks of beer, liquor or wine almost every day 0 No I have tooth or mouth problems that make it hard for me to eat 2 Yes I don't always have enough money to buy the food I need 0 No I eat alone most of the time 1 Yes I take three or more different prescribed or over-the-counter drugs a day 1 Yes Without wanting to, I have lost or gained 10 pounds in the last six months 0 No I am not always physically able to shop, cook and/or feed myself 2 Yes Nutrition Protocols Good Risk Protocol Moderate Risk Protocol High Risk Proctocol 0 Provide education on nutrition Risk Level: High Risk Score: 8 Electronic Signature(s) Signed: 08/21/2021 4:47:21 PM By: Charles Pilling RN, BSN Signed: 09/23/2021 8:46:54 AM By: Charles Gray Entered By: Charles Gray on 08/21/2021 10:18:40

## 2021-09-23 NOTE — Progress Notes (Signed)
Charles Gray ([id number]) Visit Report for 08/21/2021 Chief Complaint Document Details Patient Name: Date of Service: Charles Gray RD 08/21/2021 9:45 A M Medical Record Number: [id number] Patient Account Number: [id number] Date of Birth/Sex: Treating RN: 1956-04-05 (66 y.o. Male) Charles Gray Primary Care Provider: Cecille Gray Other Clinician: Referring Provider: Treating Provider/Extender: Charles Gray in Treatment: 0 Information Obtained from: Patient Chief Complaint 08/21/2021; left lower extremity wound Electronic Signature(s) Signed: 08/21/2021 12:56:45 PM By: Charles Shan DO Entered By: Charles Gray on 08/21/2021 12:22:51 -------------------------------------------------------------------------------- Debridement Details Patient Name: Date of Service: Charles Gray, [name] 08/21/2021 9:45 A M Medical Record Number: [id number] Patient Account Number: [id number] Date of Birth/Sex: Treating RN: 03-18-1956 (66 y.o. Male) Charles Gray Primary Care Provider: Cecille Gray Other Clinician: Referring Provider: Treating Provider/Extender: Charles Gray in Treatment: 0 Debridement Performed for Assessment: Wound #3 Left,Lateral Lower Leg Performed By: Physician Charles Shan, DO Debridement Type: Debridement Level of Consciousness (Pre-procedure): Awake and Alert Pre-procedure Verification/Time Out Yes - 11:05 Taken: Start Time: 11:06 Pain Control: Lidocaine 5% topical ointment T Area Debrided (L x W): otal 2.2 (cm) x 2.4 (cm) = 5.28 (cm) Tissue and other material debrided: Viable, Non-Viable, Slough, Subcutaneous, Skin: Dermis , Skin: Epidermis, Fibrin/Exudate, Slough Level: Skin/Subcutaneous Tissue Debridement Description: Excisional Instrument: Curette Bleeding: Minimum Hemostasis Achieved: Pressure End Time: 11:12 Procedural Pain: 0 Post Procedural Pain: 0 Response to Treatment: Procedure was tolerated well Level of Consciousness  (Post- Awake and Alert procedure): Post Debridement Measurements of Total Wound Length: (cm) 2.2 Width: (cm) 2.4 Depth: (cm) 0.1 Volume: (cm) 0.415 Character of Wound/Ulcer Post Debridement: Improved Post Procedure Diagnosis Same as Pre-procedure Electronic Signature(s) Signed: 08/21/2021 12:56:45 PM By: Charles Shan DO Signed: 08/21/2021 4:47:21 PM By: Charles Pilling RN, BSN Entered By: Charles Gray on 08/21/2021 11:12:37 -------------------------------------------------------------------------------- HPI Details Patient Name: Date of Service: Charles Gray, [name] 08/21/2021 9:45 A M Medical Record Number: [id number] Patient Account Number: [id number] Date of Birth/Sex: Treating RN: 07/20/1955 (66 y.o. Male) Charles Gray Primary Care Provider: Cecille Gray Other Clinician: Referring Provider: Treating Provider/Extender: Charles Gray in Treatment: 0 History of Present Illness HPI Description: 10/09/2020 upon evaluation today patient appears to be doing decently well all things considered in regard to his bilateral lower extremities he tells me his been using his compression socks. With that being said he does have an ABI which was done formally on October 2021 which showed a right ABI 1.10 and left 1.29. With that being said he is currently having issues with his congestive heart failure that is been somewhat out of control. For that reason he has been on a significant amount of torsemide. Currently he in fact is taking somewhere around 100 mg a day. He does have a history of chronic venous insufficiency, lymphedema, coronary artery disease, atrial fibrillation, and is on long-term anticoagulant therapy. He tells me has been having blistering that comes and goes he will have 1 area pop up eventually heal and other areas pop up. Overall he is concerned about getting cellulitis again he was recently treated for that that appears to have completely cleared. He does do daily  weight checks at home. Readmission 08/21/2021 Mr. Charles Gray is a 66 year old male with a past medical history of cirrhosis, chronic diastolic congestive heart failure, A-fib on Eliquis, chronic venous insufficiency/lymphedema that presents to the clinic for a 1 month history of spontaneous opening wound And weeping to his left lower extremity. He is on torsemide and metolazone However feels  like he has more swelling on exam over the past month. He has compression stockings but does not wear these. He states these are Velcro. He is currently been keeping the area covered. He currently denies signs of infection. Electronic Signature(s) Signed: 08/21/2021 12:56:45 PM By: Charles Shan DO Entered By: Charles Gray on 08/21/2021 12:26:56 -------------------------------------------------------------------------------- Physical Exam Details Patient Name: Date of Service: Charles Gray RD 08/21/2021 9:45 A M Medical Record Number: [id number] Patient Account Number: [id number] Date of Birth/Sex: Treating RN: 1955/08/23 (66 y.o. Male) Charles Gray Primary Care Provider: Cecille Gray Other Clinician: Referring Provider: Treating Provider/Extender: Charles Gray in Treatment: 0 Constitutional respirations regular, non-labored and within target range for patient.. Cardiovascular 2+ dorsalis pedis/posterior tibialis pulses. Psychiatric pleasant and cooperative. Notes Left lower extremity: Open wound to the proximal aspect with nonviable tissue throughout. 3+ pitting edema to the thigh. Significant swelling on exam to both extremities bilaterally. Venous stasis dermatitis bilaterally. Electronic Signature(s) Signed: 08/21/2021 12:56:45 PM By: Charles Shan DO Entered By: Charles Gray on 08/21/2021 12:28:32 -------------------------------------------------------------------------------- Physician Orders Details Patient Name: Date of Service: Charles Gray, [name]  08/21/2021 9:45 A M Medical Record Number: [id number] Patient Account Number: [id number] Date of Birth/Sex: Treating RN: 03-Aug-1955 (66 y.o. Male) Charles Gray Primary Care Provider: Cecille Gray Other Clinician: Referring Provider: Treating Provider/Extender: Charles Gray in Treatment: 0 Verbal / Phone Orders: No Diagnosis Coding ICD-10 Coding Code Description I87.2 Venous insufficiency (chronic) (peripheral) I89.0 Lymphedema, not elsewhere classified I50.42 Chronic combined systolic (congestive) and diastolic (congestive) heart failure I25.10 Atherosclerotic heart disease of native coronary artery without angina pectoris I48.0 Paroxysmal atrial fibrillation Z79.01 Long term (current) use of anticoagulants K74.60 Unspecified cirrhosis of liver Follow-up Appointments ppointment in 1 week. - Dr. Heber [place] and [place], Room 8 08/28/2021 0945 Thursday Return A Other: - ***Speak with cardiology concerning your fluid/edema control in the legs.*** ***Bring in juxtalite HD compression garment for the left leg.*** Bathing/ Shower/ Hygiene May shower with protection but do not get wound dressing(s) wet. Edema Control - Lymphedema / SCD / Other Elevate legs to the level of the heart or above for 30 minutes daily and/or when sitting, a frequency of: - 3-4 times a day throughout the day. Avoid standing for long periods of time. Exercise regularly Compression stocking or Garment 30-40 mm/Hg pressure to: - Right leg Juxtalite HD to apply in the morning and remove at night. Ensure to lotion every night before bed. Home Health New wound care orders this week; continue Home Health for wound care. May utilize formulary equivalent dressing for wound treatment orders unless otherwise specified. - patient to wear compression garment on right leg. Left leg apply calcium alginate Ag, gauze, and 3 layer compression change once a week. Monday or Tuesday. Wound Center Thursdays. Wound  Treatment Wound #3 - Lower Leg Wound Laterality: Left, Lateral Cleanser: Soap and Water (Home Health) 2 x Per Week/30 Days Discharge Instructions: May shower and wash wound with dial antibacterial soap and water prior to dressing change. Cleanser: Wound Cleanser (Home Health) 2 x Per Week/30 Days Discharge Instructions: Cleanse the wound with wound cleanser prior to applying a clean dressing using gauze sponges, not tissue or cotton balls. Peri-Wound Care: Sween Lotion (Moisturizing lotion) (Home Health) 2 x Per Week/30 Days Discharge Instructions: Apply moisturizing lotion as directed Prim Dressing: KerraCel Ag Gelling Fiber Dressing, 4x5 in (silver alginate) (Home Health) 2 x Per Week/30 Days ary Discharge Instructions: Apply silver alginate to wound bed as instructed Secondary Dressing: Woven  Gauze Sponge, Non-Sterile 4x4 in (Home Health) 2 x Per Week/30 Days Discharge Instructions: Apply over primary dressing as directed. Compression Wrap: ThreePress (3 layer compression wrap) (Home Health) 2 x Per Week/30 Days Discharge Instructions: Apply three layer compression as directed. Electronic Signature(s) Signed: 08/21/2021 12:56:45 PM By: Charles Shan DO Entered By: Charles Gray on 08/21/2021 12:28:43 -------------------------------------------------------------------------------- Problem List Details Patient Name: Date of Service: Charles Gray RD 08/21/2021 9:45 A M Medical Record Number: [id number] Patient Account Number: [id number] Date of Birth/Sex: Treating RN: Dec 14, 1955 (65 y.o. Male) Charles Gray Primary Care Provider: Cecille Gray Other Clinician: Referring Provider: Treating Provider/Extender: Charles Gray in Treatment: 0 Active Problems ICD-10 Encounter Code Description Active Date MDM Diagnosis I87.321 Chronic venous hypertension (idiopathic) with inflammation of right lower 08/21/2021 No Yes extremity L97.822 Non-pressure chronic ulcer of other  part of left lower leg with fat layer exposed5/02/2022 No Yes I89.0 Lymphedema, not elsewhere classified 08/21/2021 No Yes I48.0 Paroxysmal atrial fibrillation 08/21/2021 No Yes Z79.01 Long term (current) use of anticoagulants 08/21/2021 No Yes K74.60 Unspecified cirrhosis of liver 08/21/2021 No Yes Y86.57 Chronic diastolic (congestive) heart failure 08/21/2021 No Yes Inactive Problems Resolved Problems Electronic Signature(s) Signed: 08/21/2021 12:56:45 PM By: Charles Shan DO Entered By: Charles Gray on 08/21/2021 12:33:42 -------------------------------------------------------------------------------- Progress Note Details Patient Name: Date of Service: Charles Gray, [name] 08/21/2021 9:45 A M Medical Record Number: [id number] Patient Account Number: [id number] Date of Birth/Sex: Treating RN: June 30, 1955 (66 y.o. Male) Charles Gray Primary Care Provider: Cecille Gray Other Clinician: Referring Provider: Treating Provider/Extender: Charles Gray in Treatment: 0 Subjective Chief Complaint Information obtained from Patient 08/21/2021; left lower extremity wound History of Present Illness (HPI) 10/09/2020 upon evaluation today patient appears to be doing decently well all things considered in regard to his bilateral lower extremities he tells me his been using his compression socks. With that being said he does have an ABI which was done formally on October 2021 which showed a right ABI 1.10 and left 1.29. With that being said he is currently having issues with his congestive heart failure that is been somewhat out of control. For that reason he has been on a significant amount of torsemide. Currently he in fact is taking somewhere around 100 mg a day. He does have a history of chronic venous insufficiency, lymphedema, coronary artery disease, atrial fibrillation, and is on long-term anticoagulant therapy. He tells me has been having blistering that comes and goes he will have 1  area pop up eventually heal and other areas pop up. Overall he is concerned about getting cellulitis again he was recently treated for that that appears to have completely cleared. He does do daily weight checks at home. Readmission 08/21/2021 Mr. Charles Gray is a 66 year old male with a past medical history of cirrhosis, chronic diastolic congestive heart failure, A-fib on Eliquis, chronic venous insufficiency/lymphedema that presents to the clinic for a 1 month history of spontaneous opening wound And weeping to his left lower extremity. He is on torsemide and metolazone However feels like he has more swelling on exam over the past month. He has compression stockings but does not wear these. He states these are Velcro. He is currently been keeping the area covered. He currently denies signs of infection. Patient History Information obtained from Patient, Chart. Allergies penicillin Family History Cancer - Maternal Grandparents,Mother, Heart Disease - Mother,Maternal Grandparents, Hypertension - Mother,Maternal Grandparents, Lung Disease - Mother, Stroke - Mother, No family history of Diabetes, Hereditary Spherocytosis, Kidney Disease,  Seizures, Thyroid Problems, Tuberculosis. Social History Never smoker, Marital Status - Single, Alcohol Use - Never, Drug Use - No History, Caffeine Use - Rarely. Medical History Hematologic/Lymphatic Patient has history of Anemia Respiratory Patient has history of Chronic Obstructive Pulmonary Disease (COPD), Sleep Apnea Denies history of Asthma Cardiovascular Patient has history of Angina, Arrhythmia - A fib, Congestive Heart Failure, Deep Vein Thrombosis, Hypertension Gastrointestinal Patient has history of Cirrhosis - nonalcoholic Neurologic Patient has history of Neuropathy Hospitalization/Surgery History - EGD 03/05/19. - Heart Cath 08/09/19, 05/10/20. Medical A Surgical History  Notes nd Hematologic/Lymphatic Thrombocytopenia Respiratory history of PE Cardiovascular CardioMems Unit Gastrointestinal GERD Musculoskeletal Lumbar Herniation Review of Systems (ROS) Eyes Denies complaints or symptoms of Dry Eyes, Vision Changes, Glasses / Contacts. Ear/Nose/Mouth/Throat Denies complaints or symptoms of Chronic sinus problems or rhinitis. Respiratory COPD Gastrointestinal Gerd Integumentary (Skin) Complains or has symptoms of Wounds - Left leg. Objective Constitutional respirations regular, non-labored and within target range for patient.. Vitals Time Taken: 10:01 AM, Height: 82 in, Source: Stated, Weight: 383 lbs, Source: Stated, BMI: 40, Temperature: 98.6 F, Pulse: 80 bpm, Respiratory Rate: 20 breaths/min, Blood Pressure: 126/74 mmHg. Cardiovascular 2+ dorsalis pedis/posterior tibialis pulses. Psychiatric pleasant and cooperative. General Notes: Left lower extremity: Open wound to the proximal aspect with nonviable tissue throughout. 3+ pitting edema to the thigh. Significant swelling on exam to both extremities bilaterally. Venous stasis dermatitis bilaterally. Integumentary (Hair, Skin) Wound #3 status is Open. Original cause of wound was Gradually Appeared. The date acquired was: 04/23/2021. The wound is located on the Left,Lateral Lower Leg. The wound measures 2.2cm length x 2.4cm width x 0.1cm depth; 4.147cm^2 area and 0.415cm^3 volume. There is Fat Layer (Subcutaneous Tissue) exposed. There is no tunneling or undermining noted. There is a medium amount of serosanguineous drainage noted. The wound margin is distinct with the outline attached to the wound base. There is small (1-33%) red, pink granulation within the wound bed. There is a large (67-100%) amount of necrotic tissue within the wound bed including Adherent Slough. Assessment Active Problems ICD-10 Chronic venous hypertension (idiopathic) with inflammation of right lower  extremity Lymphedema, not elsewhere classified Paroxysmal atrial fibrillation Long term (current) use of anticoagulants Unspecified cirrhosis of liver Chronic diastolic (congestive) heart failure Patient presents with nonhealing open wound to the left lower extremity for the past month Secondary to lymphedema and worsened by his diastolic heart failure and cirrhosis. He has significant swelling on exam and I recommended compression therapy. I recommended that he follow-up with his cardiologist to discuss his diuretic regiment. Wounds will be an ongoing issue for the patient if he does not use compression therapy and work on managing his fluid. We had a long discussion about This and expressed understanding. I debrided nonviable tissue to the wound bed and recommended silver alginate under 3 layer compression. Follow-up in 1 week. He knows to not get the wrap wet and cannot keep this on for more than 7 days. 51 minutes was spent on the encounter including face-to-face, EMR review and coordination of care. Procedures Wound #3 Pre-procedure diagnosis of Wound #3 is a Lymphedema located on the Left,Lateral Lower Leg . There was a Excisional Skin/Subcutaneous Tissue Debridement with a total area of 5.28 sq cm performed by Charles Shan, DO. With the following instrument(s): Curette to remove Viable and Non-Viable tissue/material. Material removed includes Subcutaneous Tissue, Slough, Skin: Dermis, Skin: Epidermis, and Fibrin/Exudate after achieving pain control using Lidocaine 5% topical ointment. A time out was conducted at 11:05, prior to the  start of the procedure. A Minimum amount of bleeding was controlled with Pressure. The procedure was tolerated well with a pain level of 0 throughout and a pain level of 0 following the procedure. Post Debridement Measurements: 2.2cm length x 2.4cm width x 0.1cm depth; 0.415cm^3 volume. Character of Wound/Ulcer Post Debridement is improved. Post procedure  Diagnosis Wound #3: Same as Pre-Procedure Pre-procedure diagnosis of Wound #3 is a Lymphedema located on the Left,Lateral Lower Leg . There was a Three Layer Compression Therapy Procedure by Charles Pilling, RN. Post procedure Diagnosis Wound #3: Same as Pre-Procedure Plan Follow-up Appointments: Return Appointment in 1 week. - Dr. Heber [place] and [place], Room 8 08/28/2021 0945 Thursday Other: - ***Speak with cardiology concerning your fluid/edema control in the legs.*** ***Bring in juxtalite HD compression garment for the left leg.*** Bathing/ Shower/ Hygiene: May shower with protection but do not get wound dressing(s) wet. Edema Control - Lymphedema / SCD / Other: Elevate legs to the level of the heart or above for 30 minutes daily and/or when sitting, a frequency of: - 3-4 times a day throughout the day. Avoid standing for long periods of time. Exercise regularly Compression stocking or Garment 30-40 mm/Hg pressure to: - Right leg Juxtalite HD to apply in the morning and remove at night. Ensure to lotion every night before bed. Home Health: New wound care orders this week; continue Home Health for wound care. May utilize formulary equivalent dressing for wound treatment orders unless otherwise specified. - patient to wear compression garment on right leg. Left leg apply calcium alginate Ag, gauze, and 3 layer compression change once a week. Monday or Tuesday. Wound Center Thursdays. WOUND #3: - Lower Leg Wound Laterality: Left, Lateral Cleanser: Soap and Water (Home Health) 2 x Per Week/30 Days Discharge Instructions: May shower and wash wound with dial antibacterial soap and water prior to dressing change. Cleanser: Wound Cleanser (Home Health) 2 x Per Week/30 Days Discharge Instructions: Cleanse the wound with wound cleanser prior to applying a clean dressing using gauze sponges, not tissue or cotton balls. Peri-Wound Care: Sween Lotion (Moisturizing lotion) (Home Health) 2 x Per Week/30  Days Discharge Instructions: Apply moisturizing lotion as directed Prim Dressing: KerraCel Ag Gelling Fiber Dressing, 4x5 in (silver alginate) (Home Health) 2 x Per Week/30 Days ary Discharge Instructions: Apply silver alginate to wound bed as instructed Secondary Dressing: Woven Gauze Sponge, Non-Sterile 4x4 in (Home Health) 2 x Per Week/30 Days Discharge Instructions: Apply over primary dressing as directed. Com pression Wrap: ThreePress (3 layer compression wrap) (Home Health) 2 x Per Week/30 Days Discharge Instructions: Apply three layer compression as directed. 1. In office sharp debridement 2. Silver alginate under 3 layer compression 3. Follow-up in 1 week 4. Patient has an appointment with cardiology in the next 2 weeks Electronic Signature(s) Signed: 08/21/2021 12:56:45 PM By: Charles Shan DO Entered By: Charles Gray on 08/21/2021 12:32:52 -------------------------------------------------------------------------------- HxROS Details Patient Name: Date of Service: Charles Gray, [name] 08/21/2021 9:45 A M Medical Record Number: [id number] Patient Account Number: [id number] Date of Birth/Sex: Treating RN: [birth year] (65 y.o. Male) Charles Gray Primary Care Provider: Cecille Gray Other Clinician: Referring Provider: Treating Provider/Extender: Charles Gray in Treatment: 0 Information Obtained From Patient Chart Eyes Complaints and Symptoms: Negative for: Dry Eyes; Vision Changes; Glasses / Contacts Ear/Nose/Mouth/Throat Complaints and Symptoms: Negative for: Chronic sinus problems or rhinitis Integumentary (Skin) Complaints and Symptoms: Positive for: Wounds - Left leg Hematologic/Lymphatic Medical History: Positive for: Anemia Past Medical History Notes: Thrombocytopenia Respiratory Complaints and  Symptoms: Review of System Notes: COPD Medical History: Positive for: Chronic Obstructive Pulmonary Disease (COPD); Sleep Apnea Negative for:  Asthma Past Medical History Notes: history of PE Cardiovascular Medical History: Positive for: Angina; Arrhythmia - A fib; Congestive Heart Failure; Deep Vein Thrombosis; Hypertension Past Medical History Notes: CardioMems Unit Gastrointestinal Complaints and Symptoms: Review of System Notes: Gerd Medical History: Positive for: Cirrhosis - nonalcoholic Past Medical History Notes: GERD Musculoskeletal Medical History: Past Medical History Notes: Lumbar Herniation Neurologic Medical History: Positive for: Neuropathy Immunizations Pneumococcal Vaccine: Received Pneumococcal Vaccination: No Implantable Devices None Hospitalization / Surgery History Type of Hospitalization/Surgery EGD 03/05/19 Heart Cath 08/09/19, 05/10/20 Family and Social History Cancer: Yes - Maternal Grandparents,Mother; Diabetes: No; Heart Disease: Yes - Mother,Maternal Grandparents; Hereditary Spherocytosis: No; Hypertension: Yes - Mother,Maternal Grandparents; Kidney Disease: No; Lung Disease: Yes - Mother; Seizures: No; Stroke: Yes - Mother; Thyroid Problems: No; Tuberculosis: No; Never smoker; Marital Status - Single; Alcohol Use: Never; Drug Use: No History; Caffeine Use: Rarely; Financial Concerns: No; Food, Clothing or Shelter Needs: No; Support System Lacking: No; Transportation Concerns: No Electronic Signature(s) Signed: 08/21/2021 12:56:45 PM By: Charles Shan DO Signed: 08/21/2021 4:47:21 PM By: Charles Pilling RN, BSN Signed: 09/23/2021 8:46:54 AM By: Erenest Blank Entered By: Erenest Blank on 08/21/2021 10:32:54 -------------------------------------------------------------------------------- SuperBill Details Patient Name: Date of Service: Charles Gray RD 08/21/2021 Medical Record Number: [id number] Patient Account Number: [id number] Date of Birth/Sex: Treating RN: 22-Sep-1955 (66 y.o. Male) Charles Gray Primary Care Provider: Cecille Gray Other Clinician: Referring  Provider: Treating Provider/Extender: Charles Gray in Treatment: 0 Diagnosis Coding ICD-10 Codes Code Description [phone] Chronic venous hypertension (idiopathic) with inflammation of right lower extremity L97.822 Non-pressure chronic ulcer of other part of left lower leg with fat layer exposed I89.0 Lymphedema, not elsewhere classified I48.0 Paroxysmal atrial fibrillation Z79.01 Long term (current) use of anticoagulants K74.60 Unspecified cirrhosis of liver S93.73 Chronic diastolic (congestive) heart failure Facility Procedures CPT4 Code: [id number] Description: [facility] VISIT-LEV 3 EST PT Modifier: Quantity: 1 CPT4 Code: [id number] Description: 11042 - DEB SUBQ TISSUE 20 SQ CM/< ICD-10 Diagnosis Description L97.822 Non-pressure chronic ulcer of other part of left lower leg with fat layer expos Modifier: ed Quantity: 1 Physician Procedures : CPT4 Code Description Modifier [id number] - WC PHYS LEVEL 4 - EST PT ICD-10 Diagnosis Description I87.321 Chronic venous hypertension (idiopathic) with inflammation of right lower extremity L97.822 Non-pressure chronic ulcer of other part of left  lower leg with fat layer exposed I89.0 Lymphedema, not elsewhere classified I68.03 Chronic diastolic (congestive) heart failure Quantity: 1 : [id number] - WC PHYS SUBQ TISS 20 SQ CM ICD-10 Diagnosis Description L97.822 Non-pressure chronic ulcer of other part of left lower leg with fat layer exposed Quantity: 1 Electronic Signature(s) Signed: 09/23/2021 8:42:35 AM By: Charles Pilling RN, BSN Signed: 09/23/2021 9:40:49 AM By: Charles Shan DO Previous Signature: 08/25/2021 4:36:07 PM Version By: Charles Pilling RN, BSN Previous Signature: 08/25/2021 4:42:46 PM Version By: Charles Shan DO Previous Signature: 08/21/2021 12:56:45 PM Version By: Charles Shan DO Entered By: Charles Gray on 09/23/2021 08:42:35

## 2021-09-23 NOTE — Progress Notes (Signed)
Paramedicine Encounter    Patient ID: Charles Gray, male    DOB: 02-29-1956, 67 y.o.   MRN: [id number]    Arrived for home visit for Doc who was seated in his recliner reporting to not be feeling good. He complains of generalized pain, swelling, fatigue and trouble walking and getting around. Both lower legs noted to be red in color, swollen and tender to touch. This is no more than his normal. HE reports he is taking an old bottle of Doxycycline that is in date but he never completed from previous prescription but only taking one tablet daily. I advised him not to take this until instructed by his PCP, he agreed. He has not been using the topical ointments and creams as instructed by wound care clinics. I instructed him to be sure he is using Kenalog cream twice daily and keeping his legs moisturized and clean. He reports home health has not been coming out to wrap them lately and is unsure if they are coming back.   He also reports he is more confused lately. I asked him if he is taking his Lactulose as instructed three times daily, he says he is however the bottle should be empty by now for next refill and it still has 1/4 of contents still inside. I discussed the importance of med compliance with [name] today and he verbalized understanding.   I obtained vitals and assessment. Lungs clear. Weight is trending down, his last cardiomems was below goal. He denied chest pain, increased SHOB or dizziness.   Meds were reviewed and confirmed. Pill box filled for one week. Refills as noted.  -Lactulose -Inspra   We reviewed upcoming appointments and length and confirmed same. I wrote all these on sticky notes and taped them to his wall for reminder as well as on the fridge calendar and placed reminders in his phone. I also set reminders to take his medications in his phone.   We talked at length about overall health management but he declines wanting to see other physicians for mental health, etc. I  will continue to follow up. We planned meeting at his next appointment at wound care on 6/20. As he reports he has no help and gets easily confused at instructions for wound care. I will assist.   I reminded Charles Gray to submit cardio-mems readings and remain to use a heart healthy diet.    Home visit complete.   [name], EMT-Paramedic [phone] 09/23/2021         Patient Care Team: Enid Skeens., MD as PCP - General (Family Medicine) Berniece Salines, DO as PCP - Cardiology (Cardiology) Jorge Ny, LCSW as Social Worker (Licensed Clinical Social Worker)  Patient Active Problem List   Diagnosis Date Noted   Dyslipidemia 07/17/2021   Lymphedema 07/08/2021   Melena    Gastric polyps    UGIB (upper gastrointestinal bleed) 04/06/2021   Acute encephalopathy 12/21/2020   COPD with acute exacerbation ([place]) 12/21/2020   Chronic diastolic (congestive) heart failure ([place]) 11/14/2020   Syncope 10/28/2020   CHF (congestive heart failure) ([place]) 10/28/2020   Chronic venous stasis dermatitis of both lower extremities 08/27/2020   Cellulitis of both lower extremities 08/14/2020   Iron deficiency anemia 08/14/2020   GERD without esophagitis 06/10/2020   Atrial fibrillation with rapid ventricular response ([place]) 03/24/2020   Acute on chronic right heart failure ([place]) 01/27/2020   Acute respiratory failure with hypoxia ([place])    Fatty liver disease, nonalcoholic    Shock (  [place])    Personal history of DVT (deep vein thrombosis) 12/26/2019   Chronic anticoagulation 12/26/2019   Palpitations 12/26/2019   Paroxysmal atrial fibrillation ([place]) 12/23/2019   Heart failure, systolic, acute ([place]) 24/23/5361   Acute on chronic congestive heart failure (HCC)    Acute on chronic diastolic CHF (congestive heart failure) ([place]) 08/23/2019   Chest pain 08/23/2019   Lactic acidosis 08/23/2019   History of pulmonary embolism 08/12/2019   Atrial tachycardia ([place]) 08/12/2019   Occasional  tremors 08/12/2019   OSA (obstructive sleep apnea) 08/12/2019   AKI (acute kidney injury) ([place]) 08/08/2019   Acute on chronic right-sided heart failure ([place]) 08/06/2019   Acute pulmonary embolism ([place]) 06/08/2019   Normocytic anemia 06/08/2019   Pulmonary emboli ([place]) 06/08/2019   Chronic heart failure with preserved ejection fraction ([place]) 05/30/2019   Class 2 severe obesity due to excess calories with serious comorbidity and body mass index (BMI) of 39.0 to 39.9 in adult ([place]) 05/30/2019   Morbid obesity with BMI of 40.0-44.9, adult ([place]) 05/30/2019   Hyponatremia    Drug induced constipation    Peripheral edema    Hypotension due to drugs    Hypoalbuminemia due to protein-calorie malnutrition (HCC)    Thrombocytopenia (HCC)    Anemia of chronic disease    Cirrhosis of liver without ascites ([place])    Chronic pain syndrome    Debility 03/13/2019   Portal hypertensive gastropathy ([place]) 03/06/2019   Dyspnea    GIB (gastrointestinal bleeding) 03/04/2019   Mixed hyperlipidemia 01/24/2019   Insomnia 01/24/2019   Hyperlipidemia 01/24/2019   Essential hypertension 01/24/2019   Lumbar disc herniation 01/24/2019    Current Outpatient Medications:    apixaban (ELIQUIS) 5 MG TABS tablet, Take 1 tablet (5 mg total) by mouth 2 (two) times daily., Disp: 60 tablet, Rfl: 0   Buprenorphine HCl-Naloxone HCl 8-2 MG FILM, Place 1 tablet under the tongue 2 (two) times daily., Disp: , Rfl:    eplerenone (INSPRA) 25 MG tablet, Take 4 tablets (100 mg total) by mouth daily. (Patient not taking: Reported on 09/02/2021), Disp: 120 tablet, Rfl: 0   eplerenone (INSPRA) 50 MG tablet, TAKE 2 TABLETS BY MOUTH DAILY, Disp: 60 tablet, Rfl: 6   gabapentin (NEURONTIN) 800 MG tablet, Take 1 tablet (800 mg total) by mouth 3 (three) times daily., Disp: 90 tablet, Rfl: 1   lactulose, encephalopathy, (CHRONULAC) 10 GM/15ML SOLN, Take 30 mLs (20 g total) by mouth 3 (three) times daily., Disp: 236 mL, Rfl: 0   metolazone  (ZAROXOLYN) 2.5 MG tablet, Take 1 tablet (2.5 mg total) by mouth 3 (three) times a week. Monday, Wednesday and Fridays, Disp: 20 tablet, Rfl: 0   metoprolol succinate (TOPROL XL) 25 MG 24 hr tablet, Take 1 tablet (25 mg total) by mouth at bedtime., Disp: 90 tablet, Rfl: 0   pantoprazole (PROTONIX) 40 MG tablet, Take 40 mg by mouth 2 (two) times daily before a meal., Disp: , Rfl:    polyethylene glycol (MIRALAX / GLYCOLAX) 17 g packet, Take 17 g by mouth as needed for mild constipation (mix and drink as directed)., Disp: , Rfl:    potassium chloride SA (KLOR-CON M) 20 MEQ tablet, Take 5 tablets (100 mEq total) by mouth 3 (three) times daily. Take one tablet extra (40 meq) with metolazone., Disp: 463 tablet, Rfl: 6   pravastatin (PRAVACHOL) 20 MG tablet, Take 1 tablet (20 mg total) by mouth daily., Disp: 60 tablet, Rfl: 0   sertraline (ZOLOFT) 50 MG tablet,  TAKE 1 TABLET BY MOUTH DAILY, Disp: 30 tablet, Rfl: 3   tamsulosin (FLOMAX) 0.4 MG CAPS capsule, Take 1 capsule (0.4 mg total) by mouth daily., Disp: 30 capsule, Rfl: 0   torsemide (DEMADEX) 100 MG tablet, Take 1 tablet (100 mg total) by mouth twice daily., Disp: 120 tablet, Rfl: 0   triamcinolone (KENALOG) 0.025 % ointment, Apply 1 application. topically 2 (two) times daily., Disp: 30 g, Rfl: 0   VENTOLIN HFA 108 (90 Base) MCG/ACT inhaler, Inhale 1 puff into the lungs every 4 (four) hours as needed for shortness of breath., Disp: 6.7 g, Rfl: 1   zolpidem (AMBIEN) 10 MG tablet, Take 1 tablet (10 mg total) by mouth at bedtime as needed for sleep., Disp: 30 tablet, Rfl: 0 Allergies  Allergen Reactions   Penicillins Other (See Comments)    Caused convulsions as a child  Did it involve swelling of the face/tongue/throat, SOB, or low BP? N/A Did it involve sudden or severe rash/hives, skin peeling, or any reaction on the inside of your mouth or nose? N/A Did you need to seek medical attention at a hospital or doctor's office?N/A When did it last  happen? N/A If all above answers are "NO", may proceed with cephalosporin use.     Social History   Socioeconomic History   Marital status: Single    Spouse name: Not on file   Number of children: Not on file   Years of education: Not on file   Highest education level: Not on file  Occupational History   Not on file  Tobacco Use   Smoking status: Never   Smokeless tobacco: Never  Vaping Use   Vaping Use: Never used  Substance and Sexual Activity   Alcohol use: Not Currently   Drug use: Not Currently    Types: Marijuana    Comment: Smoked for 30 years   Sexual activity: Not on file  Other Topics Concern   Not on file  Social History Narrative   Not on file   Social Determinants of Health   Financial Resource Strain: Medium Risk (11/15/2020)   Overall Financial Resource Strain (CARDIA)    Difficulty of Paying Living Expenses: Somewhat hard  Food Insecurity: No Food Insecurity (11/15/2020)   Hunger Vital Sign    Worried About Running Out of Food in the Last Year: Never true    Ran Out of Food in the Last Year: Never true  Transportation Needs: Unmet Transportation Needs (11/15/2020)   PRAPARE - Hydrologist (Medical): Yes    Lack of Transportation (Non-Medical): No  Physical Activity: Not on file  Stress: Not on file  Social Connections: Not on file  Intimate Partner Violence: Not on file    Physical Exam      Future Appointments  Date Time Provider [place]  09/24/2021  9:00 AM [facility] PA/NP [facility] None  09/30/2021  9:00 AM Valinda Party, DO [facility]  11/21/2021 10:00 AM Larey Dresser, MD [facility] None     ACTION: Home visit completed

## 2021-09-24 ENCOUNTER — Encounter (HOSPITAL_COMMUNITY): Payer: Medicare HMO

## 2021-09-24 DIAGNOSIS — F332 Major depressive disorder, recurrent severe without psychotic features: Secondary | ICD-10-CM | POA: Diagnosis not present

## 2021-09-24 DIAGNOSIS — F411 Generalized anxiety disorder: Secondary | ICD-10-CM | POA: Diagnosis not present

## 2021-09-24 DIAGNOSIS — F1120 Opioid dependence, uncomplicated: Secondary | ICD-10-CM | POA: Diagnosis not present

## 2021-09-30 ENCOUNTER — Other Ambulatory Visit (HOSPITAL_COMMUNITY): Payer: Self-pay

## 2021-09-30 ENCOUNTER — Encounter (HOSPITAL_BASED_OUTPATIENT_CLINIC_OR_DEPARTMENT_OTHER): Payer: Medicare HMO | Attending: Internal Medicine | Admitting: Internal Medicine

## 2021-09-30 DIAGNOSIS — I87322 Chronic venous hypertension (idiopathic) with inflammation of left lower extremity: Secondary | ICD-10-CM

## 2021-09-30 DIAGNOSIS — L97822 Non-pressure chronic ulcer of other part of left lower leg with fat layer exposed: Secondary | ICD-10-CM | POA: Diagnosis not present

## 2021-09-30 DIAGNOSIS — I480 Paroxysmal atrial fibrillation: Secondary | ICD-10-CM | POA: Diagnosis not present

## 2021-09-30 DIAGNOSIS — I890 Lymphedema, not elsewhere classified: Secondary | ICD-10-CM | POA: Diagnosis not present

## 2021-09-30 DIAGNOSIS — I872 Venous insufficiency (chronic) (peripheral): Secondary | ICD-10-CM | POA: Diagnosis not present

## 2021-09-30 DIAGNOSIS — I110 Hypertensive heart disease with heart failure: Secondary | ICD-10-CM | POA: Diagnosis not present

## 2021-09-30 DIAGNOSIS — J449 Chronic obstructive pulmonary disease, unspecified: Secondary | ICD-10-CM | POA: Insufficient documentation

## 2021-09-30 DIAGNOSIS — I5032 Chronic diastolic (congestive) heart failure: Secondary | ICD-10-CM | POA: Diagnosis not present

## 2021-09-30 DIAGNOSIS — I2510 Atherosclerotic heart disease of native coronary artery without angina pectoris: Secondary | ICD-10-CM | POA: Diagnosis not present

## 2021-09-30 DIAGNOSIS — Z86718 Personal history of other venous thrombosis and embolism: Secondary | ICD-10-CM | POA: Insufficient documentation

## 2021-09-30 DIAGNOSIS — R6889 Other general symptoms and signs: Secondary | ICD-10-CM | POA: Diagnosis not present

## 2021-09-30 DIAGNOSIS — K7460 Unspecified cirrhosis of liver: Secondary | ICD-10-CM | POA: Diagnosis not present

## 2021-09-30 NOTE — Progress Notes (Signed)
[name], [name] ([id number]) Visit Report for 09/30/2021 Chief Complaint Document Details Patient Name: Date of Service: [name] 09/30/2021 9:00 [place] Record Number: [id number] Patient Account Number: [id number] Date of Birth/Sex: Treating RN: Apr 24, 1955 (66 y.o. Charles Gray Primary Care Provider: Cecille Amsterdam Other Clinician: Referring Provider: Treating Provider/Extender: Corena Herter in Treatment: 5 Information Obtained from: Patient Chief Complaint 08/21/2021; left lower extremity wound Electronic Signature(s) Signed: 09/30/2021 12:20:56 PM By: Kalman Shan DO Entered By: Kalman Shan on 09/30/2021 12:13:13 -------------------------------------------------------------------------------- HPI Details Patient Name: Date of Service: Charles Gray, [name] 09/30/2021 9:00 [place] Record Number: [id number] Patient Account Number: [id number] Date of Birth/Sex: Treating RN: 19-Sep-1955 (66 y.o. Charles Gray Primary Care Provider: Cecille Amsterdam Other Clinician: Referring Provider: Treating Provider/Extender: Corena Herter in Treatment: 5 History of Present Illness HPI Description: 10/09/2020 upon evaluation today patient appears to be doing decently well all things considered in regard to his bilateral lower extremities he tells me his been using his compression socks. With that being said he does have an ABI which was done formally on October 2021 which showed a right ABI 1.10 and left 1.29. With that being said he is currently having issues with his congestive heart failure that is been somewhat out of control. For that reason he has been on a significant amount of torsemide. Currently he in fact is taking somewhere around 100 mg a day. He does have a history of chronic venous insufficiency, lymphedema, coronary artery disease, atrial fibrillation, and is on long-term anticoagulant therapy. He tells me has  been having blistering that comes and goes he will have 1 area pop up eventually heal and other areas pop up. Overall he is concerned about getting cellulitis again he was recently treated for that that appears to have completely cleared. He does do daily weight checks at home. Readmission 08/21/2021 Mr. Charles Gray is a 67 year old male with a past medical history of cirrhosis, chronic diastolic congestive heart failure, A-fib on Eliquis, chronic venous insufficiency/lymphedema that presents to the clinic for a 1 month history of spontaneous opening wound And weeping to his left lower extremity. He is on torsemide and metolazone However feels like he has more swelling on exam over the past month. He has compression stockings but does not wear these. He states these are Velcro. He is currently been keeping the area covered. He currently denies signs of infection. 6/20; patient was last seen 5 weeks ago. He states that since then the wound has healed. Home health was coming out to change the dressings however reported that he had been noncompliant with the wraps. He has compression stockings but does not wear these daily. He has no complaints today. Electronic Signature(s) Signed: 09/30/2021 12:20:56 PM By: Kalman Shan DO Entered By: Kalman Shan on 09/30/2021 12:14:12 -------------------------------------------------------------------------------- Physical Exam Details Patient Name: Date of Service: [name] 09/30/2021 9:00 [place] Record Number: [id number] Patient Account Number: [id number] Date of Birth/Sex: Treating RN: 1955-12-01 (66 y.o. Charles Gray Primary Care Provider: Cecille Amsterdam Other Clinician: Referring Provider: Treating Provider/Extender: Corena Herter in Treatment: 5 Constitutional respirations regular, non-labored and within target range for patient.. Cardiovascular 2+ dorsalis pedis/posterior tibialis  pulses. Psychiatric pleasant and cooperative. Notes Left lower extremity: No open wounds. No weeping. 2+ pitting edema to the knees. Lymphedema skin changes noted. Venous stasis dermatitis. Electronic Signature(s) Signed: 09/30/2021 12:20:56 PM By: Kalman Shan DO  Entered By: Kalman Shan on 09/30/2021 12:15:14 -------------------------------------------------------------------------------- Physician Orders Details Patient Name: Date of Service: [name] 09/30/2021 9:00 [place] Record Number: [id number] Patient Account Number: [id number] Date of Birth/Sex: Treating RN: 03-30-1956 (66 y.o. Charles Gray Primary Care Provider: Cecille Amsterdam Other Clinician: Referring Provider: Treating Provider/Extender: Corena Herter in Treatment: 5 Verbal / Phone Orders: No Diagnosis Coding Discharge From [facility] Services Discharge from [place] your compression stockings FOR LIFE. T the prescription for stockings to [facility]. Call them to schedule the appt time to be fitted for custom stockings [phone]. ake Edema Control - Lymphedema / SCD / Other Elevate legs to the level of the heart or above for 30 minutes daily and/or when sitting, a frequency of: - 3-4 times a day throughout the day. Avoid standing for long periods of time. Exercise regularly Moisturize legs daily. - every night before bed. Compression stocking or Garment 20-30 mm/Hg pressure to: - Juxtalite HD apply in the morning and remove at night. Or call [facility] for custom compression stockings. [place] home health for wound care. - [facility] Electronic Signature(s) Signed: 09/30/2021 12:20:56 PM By: Kalman Shan DO Entered By: Kalman Shan on 09/30/2021 12:15:23 -------------------------------------------------------------------------------- Problem List Details Patient Name: Date of Service: [name]  09/30/2021 9:00 [place] Record Number: [id number] Patient Account Number: [id number] Date of Birth/Sex: Treating RN: Jan 12, 1956 (66 y.o. Charles Gray Primary Care Provider: Cecille Amsterdam Other Clinician: Referring Provider: Treating Provider/Extender: Corena Herter in Treatment: 5 Active Problems ICD-10 Encounter Code Description Active Date MDM Diagnosis I87.321 Chronic venous hypertension (idiopathic) with inflammation of right lower 08/21/2021 No Yes extremity I87.322 Chronic venous hypertension (idiopathic) with inflammation of left lower 09/30/2021 No Yes extremity L97.822 Non-pressure chronic ulcer of other part of left lower leg with fat layer exposed5/02/2022 No Yes I89.0 Lymphedema, not elsewhere classified 08/21/2021 No Yes I48.0 Paroxysmal atrial fibrillation 08/21/2021 No Yes Z79.01 Long term (current) use of anticoagulants 08/21/2021 No Yes K74.60 Unspecified cirrhosis of liver 08/21/2021 No Yes N82.95 Chronic diastolic (congestive) heart failure 08/21/2021 No Yes I87.2 Venous insufficiency (chronic) (peripheral) 09/30/2021 No Yes Inactive Problems Resolved Problems Electronic Signature(s) Signed: 09/30/2021 12:20:56 PM By: Kalman Shan DO Entered By: Kalman Shan on 09/30/2021 11:50:24 -------------------------------------------------------------------------------- Progress Note Details Patient Name: Date of Service: Charles Gray, [name] 09/30/2021 9:00 [place] Medical Record Number: [id number] Patient Account Number: [id number] Date of Birth/Sex: Treating RN: Dec 26, 1955 (66 y.o. Charles Gray Primary Care Provider: Cecille Amsterdam Other Clinician: Referring Provider: Treating Provider/Extender: Corena Herter in Treatment: 5 Subjective Chief Complaint Information obtained from Patient 08/21/2021; left lower extremity wound History of Present Illness (HPI) 10/09/2020 upon evaluation today patient appears to be  doing decently well all things considered in regard to his bilateral lower extremities he tells me his been using his compression socks. With that being said he does have an ABI which was done formally on October 2021 which showed a right ABI 1.10 and left 1.29. With that being said he is currently having issues with his congestive heart failure that is been somewhat out of control. For that reason he has been on a significant amount of torsemide. Currently he in fact is taking somewhere around 100 mg a day. He does have a history of chronic venous insufficiency, lymphedema, coronary artery disease, atrial fibrillation, and is on long-term anticoagulant therapy. He  tells me has been having blistering that comes and goes he will have 1 area pop up eventually heal and other areas pop up. Overall he is concerned about getting cellulitis again he was recently treated for that that appears to have completely cleared. He does do daily weight checks at home. Readmission 08/21/2021 Mr. Taseen Marasigan is a 66 year old male with a past medical history of cirrhosis, chronic diastolic congestive heart failure, A-fib on Eliquis, chronic venous insufficiency/lymphedema that presents to the clinic for a 1 month history of spontaneous opening wound And weeping to his left lower extremity. He is on torsemide and metolazone However feels like he has more swelling on exam over the past month. He has compression stockings but does not wear these. He states these are Velcro. He is currently been keeping the area covered. He currently denies signs of infection. 6/20; patient was last seen 5 weeks ago. He states that since then the wound has healed. Home health was coming out to change the dressings however reported that he had been noncompliant with the wraps. He has compression stockings but does not wear these daily. He has no complaints today. Patient History Information obtained from Patient, Chart. Family  History Cancer - Maternal Grandparents,Mother, Heart Disease - Mother,Maternal Grandparents, Hypertension - Mother,Maternal Grandparents, Lung Disease - Mother, Stroke - Mother, No family history of Diabetes, Hereditary Spherocytosis, Kidney Disease, Seizures, Thyroid Problems, Tuberculosis. Social History Never smoker, Marital Status - Single, Alcohol Use - Never, Drug Use - No History, Caffeine Use - Rarely. Medical History Hematologic/Lymphatic Patient has history of Anemia Respiratory Patient has history of Chronic Obstructive Pulmonary Disease (COPD), Sleep Apnea Denies history of Asthma Cardiovascular Patient has history of Angina, Arrhythmia - A fib, Congestive Heart Failure, Deep Vein Thrombosis, Hypertension Gastrointestinal Patient has history of Cirrhosis - nonalcoholic Neurologic Patient has history of Neuropathy Hospitalization/Surgery History - EGD 03/05/19. - Heart Cath 08/09/19, 05/10/20. Medical A Surgical History Notes nd Hematologic/Lymphatic Thrombocytopenia Respiratory history of PE Cardiovascular CardioMems Unit Gastrointestinal GERD Musculoskeletal Lumbar Herniation Objective Constitutional respirations regular, non-labored and within target range for patient.. Vitals Time Taken: 9:40 AM, Height: 82 in, Weight: 383 lbs, BMI: 40, Temperature: 98.6 F, Pulse: 79 bpm, Respiratory Rate: 19 breaths/min, Blood Pressure: 138/74 mmHg. Cardiovascular 2+ dorsalis pedis/posterior tibialis pulses. Psychiatric pleasant and cooperative. General Notes: Left lower extremity: No open wounds. No weeping. 2+ pitting edema to the knees. Lymphedema skin changes noted. Venous stasis dermatitis. Integumentary (Hair, Skin) Wound #3 status is Open. Original cause of wound was Gradually Appeared. The date acquired was: 04/23/2021. The wound has been in treatment 5 weeks. The wound is located on the Left,Lateral Lower Leg. The wound measures 0cm length x 0cm width x 0cm depth;  0cm^2 area and 0cm^3 volume. There is no tunneling or undermining noted. There is a medium amount of serosanguineous drainage noted. The wound margin is distinct with the outline attached to the wound base. There is no granulation within the wound bed. There is no necrotic tissue within the wound bed. Assessment Active Problems ICD-10 Chronic venous hypertension (idiopathic) with inflammation of right lower extremity Chronic venous hypertension (idiopathic) with inflammation of left lower extremity Non-pressure chronic ulcer of other part of left lower leg with fat layer exposed Lymphedema, not elsewhere classified Paroxysmal atrial fibrillation Long term (current) use of anticoagulants Unspecified cirrhosis of liver Chronic diastolic (congestive) heart failure Venous insufficiency (chronic) (peripheral) Patient has healed his wound on the left lower extremity. I recommended compression stockings daily. We had  a long discussion about the importance of compression to prevent future wounds. He expressed understanding. [facility] will do [facility] garment fitting as he states these are hard to put on. He may follow-up as needed. Plan [facility]charge From [facility] Services: Discharge from [place] your compression stockings FOR LIFE. T the prescription for stockings to [facility]. Call them to [facility] schedule the appt time to be fitted for custom stockings [phone]. Edema Control - Lymphedema / SCD / Other: Elevate legs to the level of the heart or above for 30 minutes daily and/or when sitting, a frequency of: - 3-4 times a day throughout the day. Avoid standing for long periods of time. Exercise regularly Moisturize legs daily. - every night before bed. Compression stocking or Garment 20-30 mm/Hg pressure to: - Juxtalite HD apply in the morning and remove at night. Or call [facility] for custom compression stockings. Home  Health: [place] home health for wound care. - [facility] 1. Compression garments dailyooprescription given for custom fitting garments 2. Follow up as needed 3. Discharge from clinic due to closed wound Electronic Signature(s) Signed: 09/30/2021 12:20:56 PM By: Kalman Shan DO Entered By: Kalman Shan on 09/30/2021 12:19:39 -------------------------------------------------------------------------------- HxROS Details Patient Name: Date of Service: Charles Gray, [name] 09/30/2021 9:00 [place] Record Number: [id number] Patient Account Number: [id number] Date of Birth/Sex: Treating RN: [birth year] (66 y.o. Charles Gray Primary Care Provider: Cecille Amsterdam Other Clinician: Referring Provider: Treating Provider/Extender: Corena Herter in Treatment: 5 Information Obtained From Patient Chart Hematologic/Lymphatic Medical History: Positive for: Anemia Past Medical History Notes: Thrombocytopenia Respiratory Medical History: Positive for: Chronic Obstructive Pulmonary Disease (COPD); Sleep Apnea Negative for: Asthma Past Medical History Notes: history of PE Cardiovascular Medical History: Positive for: Angina; Arrhythmia - A fib; Congestive Heart Failure; Deep Vein Thrombosis; Hypertension Past Medical History Notes: CardioMems Unit Gastrointestinal Medical History: Positive for: Cirrhosis - nonalcoholic Past Medical History Notes: GERD Musculoskeletal Medical History: Past Medical History Notes: Lumbar Herniation Neurologic Medical History: Positive for: Neuropathy Immunizations Pneumococcal Vaccine: Received Pneumococcal Vaccination: No Implantable Devices None Hospitalization / Surgery History Type of Hospitalization/Surgery EGD 03/05/19 Heart Cath 08/09/19, 05/10/20 Family and Social History Cancer: Yes - Maternal Grandparents,Mother; Diabetes: No; Heart Disease: Yes - Mother,Maternal Grandparents; Hereditary  Spherocytosis: No; Hypertension: Yes - Mother,Maternal Grandparents; Kidney Disease: No; Lung Disease: Yes - Mother; Seizures: No; Stroke: Yes - Mother; Thyroid Problems: No; Tuberculosis: No; Never smoker; Marital Status - Single; Alcohol Use: Never; Drug Use: No History; Caffeine Use: Rarely; Financial Concerns: No; Food, Clothing or Shelter Needs: No; Support System Lacking: No; Transportation Concerns: No Electronic Signature(s) Signed: 09/30/2021 12:20:56 PM By: Kalman Shan DO Signed: 09/30/2021 5:08:20 PM By: Deon Pilling RN, BSN Entered By: Kalman Shan on 09/30/2021 12:14:29 -------------------------------------------------------------------------------- SuperBill Details Patient Name: Date of Service: [name] 09/30/2021 Medical Record Number: [id number] Patient Account Number: [id number] Date of Birth/Sex: Treating RN: Aug 02, 1955 (66 y.o. Charles Gray Primary Care Provider: Cecille Amsterdam Other Clinician: Referring Provider: Treating Provider/Extender: Corena Herter in Treatment: 5 Diagnosis Coding ICD-10 Codes Code Description [id number] Chronic venous hypertension (idiopathic) with inflammation of right lower extremity I87.322 Chronic venous hypertension (idiopathic) with inflammation of left lower extremity L97.822 Non-pressure chronic ulcer of other part of left lower leg with fat layer exposed I89.0 Lymphedema, not elsewhere classified I48.0 Paroxysmal atrial fibrillation Z79.01 Long term (current) use of anticoagulants  K74.60 Unspecified cirrhosis of liver F41.42 Chronic diastolic (congestive) heart failure I87.2 Venous insufficiency (chronic) (peripheral) Facility Procedures CPT4 Code: [id number] Description: 99214 - WOUND CARE VISIT-LEV 4 EST PT Modifier: Quantity: 1 Physician Procedures : CPT4 Code Description Modifier [id number] - WC PHYS LEVEL 3 - EST PT ICD-10 Diagnosis Description I87.322 Chronic venous  hypertension (idiopathic) with inflammation of left lower extremity L97.822 Non-pressure chronic ulcer of other part of left  lower leg with fat layer exposed I89.0 Lymphedema, not elsewhere classified K74.60 Unspecified cirrhosis of liver Quantity: 1 Electronic Signature(s) Signed: 09/30/2021 12:20:56 PM By: Kalman Shan DO Entered By: Kalman Shan on 09/30/2021 12:20:17

## 2021-09-30 NOTE — Progress Notes (Signed)
Paramedicine Encounter    Patient ID: Charles Gray, male    DOB: [birth year], 66 y.o.   MRN: [id number]   Met with Charles Gray at wound care clinic today where he was seen by Dr. Heber Armstrong. They report that his wound is cleared up, and reports that he should be wearing his compression hose daily. They gave him prescription for custom fit stockings hose. I will ensure he will get same scheduled. Wound Care advised he does not need to return for any future appointments for now. They encouraged him to elevate legs, use compression hose daily and keep legs moisturized. I will continue to encourage same for him during our weekly visits.   I reviewed meds and confirmed same.  Pill box filled for one week.   Refills as noted: Pantoprazole (called into [facility])  Cardio Mems noted to be 8 yesterday- Goal is 9.  We reviewed upcoming appointments and he wants to cancel next weeks HF clinic visit. I will see him in the home next week.   [place] called his transportation and will use [employer] to get home today.   Visit complete.    [name], [place] 09/30/2021         Patient Care Team: Enid Skeens., MD as PCP - General (Family Medicine) Berniece Salines, DO as PCP - Cardiology (Cardiology) Jorge Ny, LCSW as Social Worker (Licensed Clinical Social Worker)  Patient Active Problem List   Diagnosis Date Noted   Dyslipidemia 07/17/2021   Lymphedema 07/08/2021   Melena    Gastric polyps    UGIB (upper gastrointestinal bleed) 04/06/2021   Acute encephalopathy 12/21/2020   COPD with acute exacerbation ([place]) 12/21/2020   Chronic diastolic (congestive) heart failure ([place]) 11/14/2020   Syncope 10/28/2020   CHF (congestive heart failure) ([place]) 10/28/2020   Chronic venous stasis dermatitis of both lower extremities 08/27/2020   Cellulitis of both lower extremities 08/14/2020   Iron deficiency anemia 08/14/2020   GERD without esophagitis 06/10/2020   Atrial  fibrillation with rapid ventricular response ([place]) 03/24/2020   Acute on chronic right heart failure ([place]) 01/27/2020   Acute respiratory failure with hypoxia (HCC)    Fatty liver disease, nonalcoholic    Shock ([place])    Personal history of DVT (deep vein thrombosis) 12/26/2019   Chronic anticoagulation 12/26/2019   Palpitations 12/26/2019   Paroxysmal atrial fibrillation ([place]) 12/23/2019   Heart failure, systolic, acute ([place]) 02/13/1116   Acute on chronic congestive heart failure (HCC)    Acute on chronic diastolic CHF (congestive heart failure) ([place]) 08/23/2019   Chest pain 08/23/2019   Lactic acidosis 08/23/2019   History of pulmonary embolism 08/12/2019   Atrial tachycardia ([place]) 08/12/2019   Occasional tremors 08/12/2019   OSA (obstructive sleep apnea) 08/12/2019   AKI (acute kidney injury) ([place]) 08/08/2019   Acute on chronic right-sided heart failure ([place]) 08/06/2019   Acute pulmonary embolism ([place]) 06/08/2019   Normocytic anemia 06/08/2019   Pulmonary emboli ([place]) 06/08/2019   Chronic heart failure with preserved ejection fraction ([place]) 05/30/2019   Class 2 severe obesity due to excess calories with serious comorbidity and body mass index (BMI) of 39.0 to 39.9 in adult ([place]) 05/30/2019   Morbid obesity with BMI of 40.0-44.9, adult ([place]) 05/30/2019   Hyponatremia    Drug induced constipation    Peripheral edema    Hypotension due to drugs    Hypoalbuminemia due to protein-calorie malnutrition (HCC)    Thrombocytopenia (HCC)    Anemia of chronic disease  Cirrhosis of liver without ascites (HCC)    Chronic pain syndrome    Debility 03/13/2019   Portal hypertensive gastropathy ([place]) 03/06/2019   Dyspnea    GIB (gastrointestinal bleeding) 03/04/2019   Mixed hyperlipidemia 01/24/2019   Insomnia 01/24/2019   Hyperlipidemia 01/24/2019   Essential hypertension 01/24/2019   Lumbar disc herniation 01/24/2019    Current Outpatient Medications:    apixaban (ELIQUIS) 5 MG  TABS tablet, Take 1 tablet (5 mg total) by mouth 2 (two) times daily., Disp: 60 tablet, Rfl: 0   Buprenorphine HCl-Naloxone HCl 8-2 MG FILM, Place 1 tablet under the tongue 2 (two) times daily., Disp: , Rfl:    eplerenone (INSPRA) 25 MG tablet, Take 4 tablets (100 mg total) by mouth daily. (Patient not taking: Reported on 09/02/2021), Disp: 120 tablet, Rfl: 0   eplerenone (INSPRA) 50 MG tablet, TAKE 2 TABLETS BY MOUTH DAILY, Disp: 60 tablet, Rfl: 6   gabapentin (NEURONTIN) 800 MG tablet, Take 1 tablet (800 mg total) by mouth 3 (three) times daily., Disp: 90 tablet, Rfl: 1   lactulose, encephalopathy, (CHRONULAC) 10 GM/15ML SOLN, Take 30 mLs (20 g total) by mouth 3 (three) times daily., Disp: 236 mL, Rfl: 0   metolazone (ZAROXOLYN) 2.5 MG tablet, Take 1 tablet (2.5 mg total) by mouth 3 (three) times a week. Monday, Wednesday and Fridays, Disp: 20 tablet, Rfl: 0   metoprolol succinate (TOPROL XL) 25 MG 24 hr tablet, Take 1 tablet (25 mg total) by mouth at bedtime., Disp: 90 tablet, Rfl: 0   pantoprazole (PROTONIX) 40 MG tablet, Take 40 mg by mouth 2 (two) times daily before a meal., Disp: , Rfl:    polyethylene glycol (MIRALAX / GLYCOLAX) 17 g packet, Take 17 g by mouth as needed for mild constipation (mix and drink as directed)., Disp: , Rfl:    potassium chloride SA (KLOR-CON M) 20 MEQ tablet, Take 5 tablets (100 mEq total) by mouth 3 (three) times daily. Take one tablet extra (40 meq) with metolazone., Disp: 463 tablet, Rfl: 6   pravastatin (PRAVACHOL) 20 MG tablet, Take 1 tablet (20 mg total) by mouth daily., Disp: 60 tablet, Rfl: 0   sertraline (ZOLOFT) 50 MG tablet, TAKE 1 TABLET BY MOUTH DAILY, Disp: 30 tablet, Rfl: 3   tamsulosin (FLOMAX) 0.4 MG CAPS capsule, Take 1 capsule (0.4 mg total) by mouth daily., Disp: 30 capsule, Rfl: 0   torsemide (DEMADEX) 100 MG tablet, Take 1 tablet (100 mg total) by mouth twice daily., Disp: 120 tablet, Rfl: 0   triamcinolone (KENALOG) 0.025 % ointment, Apply 1  application. topically 2 (two) times daily., Disp: 30 g, Rfl: 0   VENTOLIN HFA 108 (90 Base) MCG/ACT inhaler, Inhale 1 puff into the lungs every 4 (four) hours as needed for shortness of breath., Disp: 6.7 g, Rfl: 1   zolpidem (AMBIEN) 10 MG tablet, Take 1 tablet (10 mg total) by mouth at bedtime as needed for sleep., Disp: 30 tablet, Rfl: 0 Allergies  Allergen Reactions   Penicillins Other (See Comments)    Caused convulsions as a child  Did it involve swelling of the face/tongue/throat, SOB, or low BP? N/A Did it involve sudden or severe rash/hives, skin peeling, or any reaction on the inside of your mouth or nose? N/A Did you need to seek medical attention at a hospital or doctor's office?N/A When did it last happen? N/A If all above answers are "NO", may proceed with cephalosporin use.     Social History   Socioeconomic  History   Marital status: Single    Spouse name: Not on file   Number of children: Not on file   Years of education: Not on file   Highest education level: Not on file  Occupational History   Not on file  Tobacco Use   Smoking status: Never   Smokeless tobacco: Never  Vaping Use   Vaping Use: Never used  Substance and Sexual Activity   Alcohol use: Not Currently   Drug use: Not Currently    Types: Marijuana    Comment: Smoked for 30 years   Sexual activity: Not on file  Other Topics Concern   Not on file  Social History Narrative   Not on file   Social Determinants of Health   Financial Resource Strain: Medium Risk (11/15/2020)   Overall Financial Resource Strain (CARDIA)    Difficulty of Paying Living Expenses: Somewhat hard  Food Insecurity: No Food Insecurity (11/15/2020)   Hunger Vital Sign    Worried About Running Out of Food in the Last Year: Never true    Ran Out of Food in the Last Year: Never true  Transportation Needs: Unmet Transportation Needs (11/15/2020)   PRAPARE - Hydrologist (Medical): Yes    Lack of  Transportation (Non-Medical): No  Physical Activity: Not on file  Stress: Not on file  Social Connections: Not on file  Intimate Partner Violence: Not on file    Physical Exam      Future Appointments  Date Time Provider [place]  10/07/2021  3:30 PM [facility] PA/NP [facility] None  11/21/2021 10:00 AM Larey Dresser, MD [facility] None     ACTION: Home visit completed

## 2021-10-02 ENCOUNTER — Telehealth (HOSPITAL_COMMUNITY): Payer: Self-pay

## 2021-10-02 NOTE — Telephone Encounter (Signed)
Mr. Charles Gray called to cancel his appointment in clinic for 6/27 reporting he wants to keep his follow up with Dr. [name] in July. I will follow up next week in the home. I sent message to clinic scheduler for same.   Refill called into [facility] -Pantoprazole    [name], [place] 10/02/2021

## 2021-10-07 ENCOUNTER — Other Ambulatory Visit (HOSPITAL_COMMUNITY): Payer: Self-pay | Admitting: *Deleted

## 2021-10-07 ENCOUNTER — Other Ambulatory Visit (HOSPITAL_COMMUNITY): Payer: Self-pay | Admitting: Emergency Medicine

## 2021-10-07 ENCOUNTER — Encounter (HOSPITAL_COMMUNITY): Payer: Medicare HMO

## 2021-10-07 NOTE — Progress Notes (Signed)
Paramedicine Encounter    Patient ID: Charles Gray, male    DOB: December 11, 1955, 66 y.o.   MRN: [id number]   There were no vitals taken for this visit. Weight yesterday-384lb Last visit weight-395lb Cbg 132  ATF Mr. [place] A&O x 4.  A home health nurse had just finished wrapping his left leg and asked if I could call someone and get orders for his rt. Leg to be wrapped.  His right leg was very red, swollen and almost to the point of weeping.  I reached out to Dr. Wendie Agreste, his PCP and they stated they could not right an order until they see him.  I also reached out to [facility] who stated that he had been discharged from them on 6/20 and also could not write an order unless he had another visit with them.  I advised pt this information and told him he would have to make this appointment.  Also, he had a prescription for support stockings and we discussed what needed to make an appointment to be fitted for same.  He advised he would contact his insurance to make sure they would pay before he scheduled the appointment.  Pt denies chest pain or SOB.  Lung sounds clear and equal bilat. Pill boxes reconciled for the next two weeks.  Refills called in for: Sertraline, Pantoprazole, Metolazone, Tamulosin and Eliquis.  His home health CNA advised she would pick his meds up tomorrow on her way to see pt.  Home visit complete.  Patient Care Team: Enid Skeens., MD as PCP - General (Family Medicine) Berniece Salines, DO as PCP - Cardiology (Cardiology) Jorge Ny, LCSW as Social Worker (Licensed Clinical Social Worker)  Patient Active Problem List   Diagnosis Date Noted   Dyslipidemia 07/17/2021   Lymphedema 07/08/2021   Melena    Gastric polyps    UGIB (upper gastrointestinal bleed) 04/06/2021   Acute encephalopathy 12/21/2020   COPD with acute exacerbation ([place]) 12/21/2020   Chronic diastolic (congestive) heart failure ([place]) 11/14/2020   Syncope 10/28/2020   CHF  (congestive heart failure) ([place]) 10/28/2020   Chronic venous stasis dermatitis of both lower extremities 08/27/2020   Cellulitis of both lower extremities 08/14/2020   Iron deficiency anemia 08/14/2020   GERD without esophagitis 06/10/2020   Atrial fibrillation with rapid ventricular response ([place]) 03/24/2020   Acute on chronic right heart failure ([place]) 01/27/2020   Acute respiratory failure with hypoxia (HCC)    Fatty liver disease, nonalcoholic    Shock ([place])    Personal history of DVT (deep vein thrombosis) 12/26/2019   Chronic anticoagulation 12/26/2019   Palpitations 12/26/2019   Paroxysmal atrial fibrillation ([place]) 12/23/2019   Heart failure, systolic, acute ([place]) 94/17/4081   Acute on chronic congestive heart failure (HCC)    Acute on chronic diastolic CHF (congestive heart failure) ([place]) 08/23/2019   Chest pain 08/23/2019   Lactic acidosis 08/23/2019   History of pulmonary embolism 08/12/2019   Atrial tachycardia ([place]) 08/12/2019   Occasional tremors 08/12/2019   OSA (obstructive sleep apnea) 08/12/2019   AKI (acute kidney injury) ([place]) 08/08/2019   Acute on chronic right-sided heart failure ([place]) 08/06/2019   Acute pulmonary embolism ([place]) 06/08/2019   Normocytic anemia 06/08/2019   Pulmonary emboli ([place]) 06/08/2019   Chronic heart failure with preserved ejection fraction ([place]) 05/30/2019   Class 2 severe obesity due to excess calories with serious comorbidity and body mass index (BMI) of 39.0 to 39.9 in adult [facility]) 05/30/2019  Morbid obesity with BMI of 40.0-44.9, adult ([place]) 05/30/2019   Hyponatremia    Drug induced constipation    Peripheral edema    Hypotension due to drugs    Hypoalbuminemia due to protein-calorie malnutrition (HCC)    Thrombocytopenia (HCC)    Anemia of chronic disease    Cirrhosis of liver without ascites (HCC)    Chronic pain syndrome    Debility 03/13/2019   Portal hypertensive gastropathy ([place]) 03/06/2019   Dyspnea    GIB  (gastrointestinal bleeding) 03/04/2019   Mixed hyperlipidemia 01/24/2019   Insomnia 01/24/2019   Hyperlipidemia 01/24/2019   Essential hypertension 01/24/2019   Lumbar disc herniation 01/24/2019    Current Outpatient Medications:    apixaban (ELIQUIS) 5 MG TABS tablet, Take 1 tablet (5 mg total) by mouth 2 (two) times daily., Disp: 60 tablet, Rfl: 0   Buprenorphine HCl-Naloxone HCl 8-2 MG FILM, Place 1 tablet under the tongue 2 (two) times daily., Disp: , Rfl:    eplerenone (INSPRA) 25 MG tablet, Take 4 tablets (100 mg total) by mouth daily. (Patient not taking: Reported on 09/02/2021), Disp: 120 tablet, Rfl: 0   eplerenone (INSPRA) 50 MG tablet, TAKE 2 TABLETS BY MOUTH DAILY, Disp: 60 tablet, Rfl: 6   gabapentin (NEURONTIN) 800 MG tablet, Take 1 tablet (800 mg total) by mouth 3 (three) times daily., Disp: 90 tablet, Rfl: 1   lactulose, encephalopathy, (CHRONULAC) 10 GM/15ML SOLN, Take 30 mLs (20 g total) by mouth 3 (three) times daily., Disp: 236 mL, Rfl: 0   metolazone (ZAROXOLYN) 2.5 MG tablet, Take 1 tablet (2.5 mg total) by mouth 3 (three) times a week. Monday, Wednesday and Fridays, Disp: 20 tablet, Rfl: 0   metoprolol succinate (TOPROL XL) 25 MG 24 hr tablet, Take 1 tablet (25 mg total) by mouth at bedtime., Disp: 90 tablet, Rfl: 0   pantoprazole (PROTONIX) 40 MG tablet, Take 40 mg by mouth 2 (two) times daily before a meal., Disp: , Rfl:    polyethylene glycol (MIRALAX / GLYCOLAX) 17 g packet, Take 17 g by mouth as needed for mild constipation (mix and drink as directed)., Disp: , Rfl:    potassium chloride SA (KLOR-CON M) 20 MEQ tablet, Take 5 tablets (100 mEq total) by mouth 3 (three) times daily. Take one tablet extra (40 meq) with metolazone., Disp: 463 tablet, Rfl: 6   pravastatin (PRAVACHOL) 20 MG tablet, Take 1 tablet (20 mg total) by mouth daily., Disp: 60 tablet, Rfl: 0   sertraline (ZOLOFT) 50 MG tablet, TAKE 1 TABLET BY MOUTH DAILY, Disp: 30 tablet, Rfl: 3   tamsulosin  (FLOMAX) 0.4 MG CAPS capsule, Take 1 capsule (0.4 mg total) by mouth daily., Disp: 30 capsule, Rfl: 0   torsemide (DEMADEX) 100 MG tablet, Take 1 tablet (100 mg total) by mouth twice daily., Disp: 120 tablet, Rfl: 0   triamcinolone (KENALOG) 0.025 % ointment, Apply 1 application. topically 2 (two) times daily., Disp: 30 g, Rfl: 0   VENTOLIN HFA 108 (90 Base) MCG/ACT inhaler, Inhale 1 puff into the lungs every 4 (four) hours as needed for shortness of breath., Disp: 6.7 g, Rfl: 1   zolpidem (AMBIEN) 10 MG tablet, Take 1 tablet (10 mg total) by mouth at bedtime as needed for sleep., Disp: 30 tablet, Rfl: 0 Allergies  Allergen Reactions   Penicillins Other (See Comments)    Caused convulsions as a child  Did it involve swelling of the face/tongue/throat, SOB, or low BP? N/A Did it involve sudden or severe  rash/hives, skin peeling, or any reaction on the inside of your mouth or nose? N/A Did you need to seek medical attention at a hospital or doctor's office?N/A When did it last happen? N/A If all above answers are "NO", may proceed with cephalosporin use.      Social History   Socioeconomic History   Marital status: Single    Spouse name: Not on file   Number of children: Not on file   Years of education: Not on file   Highest education level: Not on file  Occupational History   Not on file  Tobacco Use   Smoking status: Never   Smokeless tobacco: Never  Vaping Use   Vaping Use: Never used  Substance and Sexual Activity   Alcohol use: Not Currently   Drug use: Not Currently    Types: Marijuana    Comment: Smoked for 30 years   Sexual activity: Not on file  Other Topics Concern   Not on file  Social History Narrative   Not on file   Social Determinants of Health   Financial Resource Strain: Medium Risk (11/15/2020)   Overall Financial Resource Strain (CARDIA)    Difficulty of Paying Living Expenses: Somewhat hard  Food Insecurity: No Food Insecurity (11/15/2020)   Hunger  Vital Sign    Worried About Running Out of Food in the Last Year: Never true    Ran Out of Food in the Last Year: Never true  Transportation Needs: Unmet Transportation Needs (11/15/2020)   PRAPARE - Hydrologist (Medical): Yes    Lack of Transportation (Non-Medical): No  Physical Activity: Not on file  Stress: Not on file  Social Connections: Not on file  Intimate Partner Violence: Not on file    Physical Exam      Future Appointments  Date Time Provider [place]  11/21/2021 10:00 AM Larey Dresser, MD [place] None       Renee Ramus, [place] [facility] Paramedic  10/07/21

## 2021-10-08 ENCOUNTER — Other Ambulatory Visit (HOSPITAL_COMMUNITY): Payer: Self-pay

## 2021-10-08 MED ORDER — SERTRALINE HCL 50 MG PO TABS: 50.0000 mg | ORAL_TABLET | Freq: Every day | ORAL | 3 refills | Status: DC

## 2021-10-21 ENCOUNTER — Other Ambulatory Visit (HOSPITAL_COMMUNITY): Payer: Self-pay

## 2021-10-21 NOTE — Progress Notes (Signed)
Paramedicine Encounter    Patient ID: Charles Gray, male    DOB: 1956-02-20, 66 y.o.   MRN: [id number]  Arrived for home visit for Charles Gray who reports feeling "okay" today. He reports having some lower leg pain and increased pain around the heels of his feet. On exam his feet seem very dry and heels have some cracks noted- I applied cream to same and kenalog cream to lower legs.  Lower legs appear red, swollen but no more than his baseline.   Vitals obtained:  WT- 395lbs (10lbs weight gain in 2 weeks) BP- 122/74 HR- 93 O2- 96 RR- 18  Charles Gray reports he feels his weight is increased due to him snacking more over the last two weeks. He says he does feel slightly short of breath upon walking short distances. Lungs clear on assessment.   He got a new walker and report he is walking well with it and feels like he is doing well ambulating this week. He reports PT is no longer coming out and his nurse that wraps his legs is no longer coming out either. He does have an RX for custom compression hose- I contacted [facility] in [place] and set up appointment for him for Aug. 8th at 1:00- insurance will cover same.   We reviewed meds and I filled pill box for one week. He has been taking his meds as instructed.   Refills: Inspra  Zoloft   Charles Gray is requesting an order for home health to continue wrapping his legs- I advised him I could reach out to HF clinic for same but most likely will need to come from PCP. I will send message to HF triage.   Appointments reviewed and confirmed.  HF education discussed. I plan to see him in one week.   Home visit complete.   [name], [place] 10/21/2021      Patient Care Team: Enid Skeens., MD as PCP - General (Family Medicine) Berniece Salines, DO as PCP - Cardiology (Cardiology) Jorge Ny, LCSW as Social Worker (Licensed Clinical Social Worker)  Patient Active Problem List   Diagnosis Date Noted   Dyslipidemia  07/17/2021   Lymphedema 07/08/2021   Melena    Gastric polyps    UGIB (upper gastrointestinal bleed) 04/06/2021   Acute encephalopathy 12/21/2020   COPD with acute exacerbation ([place]) 12/21/2020   Chronic diastolic (congestive) heart failure ([place]) 11/14/2020   Syncope 10/28/2020   CHF (congestive heart failure) ([place]) 10/28/2020   Chronic venous stasis dermatitis of both lower extremities 08/27/2020   Cellulitis of both lower extremities 08/14/2020   Iron deficiency anemia 08/14/2020   GERD without esophagitis 06/10/2020   Atrial fibrillation with rapid ventricular response ([place]) 03/24/2020   Acute on chronic right heart failure ([place]) 01/27/2020   Acute respiratory failure with hypoxia (HCC)    Fatty liver disease, nonalcoholic    Shock ([place])    Personal history of DVT (deep vein thrombosis) 12/26/2019   Chronic anticoagulation 12/26/2019   Palpitations 12/26/2019   Paroxysmal atrial fibrillation ([place]) 12/23/2019   Heart failure, systolic, acute ([place]) 73/22/0254   Acute on chronic congestive heart failure (HCC)    Acute on chronic diastolic CHF (congestive heart failure) ([place]) 08/23/2019   Chest pain 08/23/2019   Lactic acidosis 08/23/2019   History of pulmonary embolism 08/12/2019   Atrial tachycardia ([place]) 08/12/2019   Occasional tremors 08/12/2019   OSA (obstructive sleep apnea) 08/12/2019   AKI (acute kidney injury) ([place]) 08/08/2019   Acute on  chronic right-sided heart failure ([place]) 08/06/2019   Acute pulmonary embolism ([place]) 06/08/2019   Normocytic anemia 06/08/2019   Pulmonary emboli ([place]) 06/08/2019   Chronic heart failure with preserved ejection fraction ([place]) 05/30/2019   Class 2 severe obesity due to excess calories with serious comorbidity and body mass index (BMI) of 39.0 to 39.9 in adult [facility]) 05/30/2019   Morbid obesity with BMI of 40.0-44.9, adult ([place]) 05/30/2019   Hyponatremia    Drug induced constipation    Peripheral edema    Hypotension due to drugs     Hypoalbuminemia due to protein-calorie malnutrition (HCC)    Thrombocytopenia (HCC)    Anemia of chronic disease    Cirrhosis of liver without ascites (HCC)    Chronic pain syndrome    Debility 03/13/2019   Portal hypertensive gastropathy ([place]) 03/06/2019   Dyspnea    GIB (gastrointestinal bleeding) 03/04/2019   Mixed hyperlipidemia 01/24/2019   Insomnia 01/24/2019   Hyperlipidemia 01/24/2019   Essential hypertension 01/24/2019   Lumbar disc herniation 01/24/2019    Current Outpatient Medications:    apixaban (ELIQUIS) 5 MG TABS tablet, Take 1 tablet (5 mg total) by mouth 2 (two) times daily., Disp: 60 tablet, Rfl: 0   Buprenorphine HCl-Naloxone HCl 8-2 MG FILM, Place 1 tablet under the tongue 2 (two) times daily., Disp: , Rfl:    eplerenone (INSPRA) 25 MG tablet, Take 4 tablets (100 mg total) by mouth daily. (Patient not taking: Reported on 09/02/2021), Disp: 120 tablet, Rfl: 0   eplerenone (INSPRA) 50 MG tablet, TAKE 2 TABLETS BY MOUTH DAILY, Disp: 60 tablet, Rfl: 6   gabapentin (NEURONTIN) 800 MG tablet, Take 1 tablet (800 mg total) by mouth 3 (three) times daily., Disp: 90 tablet, Rfl: 1   lactulose, encephalopathy, (CHRONULAC) 10 GM/15ML SOLN, Take 30 mLs (20 g total) by mouth 3 (three) times daily., Disp: 236 mL, Rfl: 0   metolazone (ZAROXOLYN) 2.5 MG tablet, Take 1 tablet (2.5 mg total) by mouth 3 (three) times a week. Monday, Wednesday and Fridays, Disp: 20 tablet, Rfl: 0   metoprolol succinate (TOPROL XL) 25 MG 24 hr tablet, Take 1 tablet (25 mg total) by mouth at bedtime., Disp: 90 tablet, Rfl: 0   pantoprazole (PROTONIX) 40 MG tablet, Take 40 mg by mouth 2 (two) times daily before a meal., Disp: , Rfl:    polyethylene glycol (MIRALAX / GLYCOLAX) 17 g packet, Take 17 g by mouth as needed for mild constipation (mix and drink as directed)., Disp: , Rfl:    potassium chloride SA (KLOR-CON M) 20 MEQ tablet, Take 5 tablets (100 mEq total) by mouth 3 (three) times daily. Take one tablet  extra (40 meq) with metolazone., Disp: 463 tablet, Rfl: 6   pravastatin (PRAVACHOL) 20 MG tablet, Take 1 tablet (20 mg total) by mouth daily., Disp: 60 tablet, Rfl: 0   sertraline (ZOLOFT) 50 MG tablet, Take 1 tablet (50 mg total) by mouth daily., Disp: 30 tablet, Rfl: 3   tamsulosin (FLOMAX) 0.4 MG CAPS capsule, Take 1 capsule (0.4 mg total) by mouth daily., Disp: 30 capsule, Rfl: 0   torsemide (DEMADEX) 100 MG tablet, Take 1 tablet (100 mg total) by mouth twice daily., Disp: 120 tablet, Rfl: 0   triamcinolone (KENALOG) 0.025 % ointment, Apply 1 application. topically 2 (two) times daily., Disp: 30 g, Rfl: 0   VENTOLIN HFA 108 (90 Base) MCG/ACT inhaler, Inhale 1 puff into the lungs every 4 (four) hours as needed for shortness of breath., Disp: 6.7  g, Rfl: 1   zolpidem (AMBIEN) 10 MG tablet, Take 1 tablet (10 mg total) by mouth at bedtime as needed for sleep., Disp: 30 tablet, Rfl: 0 Allergies  Allergen Reactions   Penicillins Other (See Comments)    Caused convulsions as a child  Did it involve swelling of the face/tongue/throat, SOB, or low BP? N/A Did it involve sudden or severe rash/hives, skin peeling, or any reaction on the inside of your mouth or nose? N/A Did you need to seek medical attention at a hospital or doctor's office?N/A When did it last happen? N/A If all above answers are "NO", may proceed with cephalosporin use.     Social History   Socioeconomic History   Marital status: Single    Spouse name: Not on file   Number of children: Not on file   Years of education: Not on file   Highest education level: Not on file  Occupational History   Not on file  Tobacco Use   Smoking status: Never   Smokeless tobacco: Never  Vaping Use   Vaping Use: Never used  Substance and Sexual Activity   Alcohol use: Not Currently   Drug use: Not Currently    Types: Marijuana    Comment: Smoked for 30 years   Sexual activity: Not on file  Other Topics Concern   Not on file  Social  History Narrative   Not on file   Social Determinants of Health   Financial Resource Strain: Medium Risk (11/15/2020)   Overall Financial Resource Strain (CARDIA)    Difficulty of Paying Living Expenses: Somewhat hard  Food Insecurity: No Food Insecurity (11/15/2020)   Hunger Vital Sign    Worried About Running Out of Food in the Last Year: Never true    Ran Out of Food in the Last Year: Never true  Transportation Needs: Unmet Transportation Needs (11/15/2020)   PRAPARE - Hydrologist (Medical): Yes    Lack of Transportation (Non-Medical): No  Physical Activity: Not on file  Stress: Not on file  Social Connections: Not on file  Intimate Partner Violence: Not on file    Physical Exam      Future Appointments  Date Time Provider [place]  11/21/2021 10:00 AM Larey Dresser, MD [facility] None     ACTION: Home visit completed

## 2021-10-22 DIAGNOSIS — Z79891 Long term (current) use of opiate analgesic: Secondary | ICD-10-CM | POA: Diagnosis not present

## 2021-10-22 DIAGNOSIS — F411 Generalized anxiety disorder: Secondary | ICD-10-CM | POA: Diagnosis not present

## 2021-10-22 DIAGNOSIS — F332 Major depressive disorder, recurrent severe without psychotic features: Secondary | ICD-10-CM | POA: Diagnosis not present

## 2021-10-22 DIAGNOSIS — F1120 Opioid dependence, uncomplicated: Secondary | ICD-10-CM | POA: Diagnosis not present

## 2021-10-28 ENCOUNTER — Other Ambulatory Visit (HOSPITAL_COMMUNITY): Payer: Self-pay

## 2021-10-28 ENCOUNTER — Telehealth (HOSPITAL_COMMUNITY): Payer: Self-pay

## 2021-10-28 NOTE — Progress Notes (Signed)
Paramedicine Encounter    Patient ID: Braidon Chermak, male    DOB: 03-11-1956, 66 y.o.   MRN: [id number]   Arrived for home visit for Charles Gray who reports feeling short of breath, increased weight and leg swelling over the last few days. He reports his weight is up 8lbs since last week from 395lbs to 403lbs. His legs are swollen more than last week and he is more short of breath upon exertion (walking to bathroom). He states he took an extra Torsemide on his own today and yesterday.    He reports he has been eating more salty foods and drinking more fluids over the last week. He stated he feels like the metolazone dries him out and he has to compensate by over-hydrating.   I discussed the importance of a consistent diet and medication regimen and remembering the fluid intake and sodium intake restrictions and following them daily. He verbalized understanding.   He reports he will submit a cardio-mems today when his caregiver comes out like they always do.   Cardiomems is 11 when goal is 9.   I explained the benefits of Furoscix to [facility] as they did before in the clinic and he is now agreeable and interested. I forwarded a message to triage CMA about asking provider if he could still qualify for same.   Aundra also tells me he has a CPAP machine in his closet that he got over two years ago and doesn't use. I pulled out equipment and he is missing tubing and face mask. I contacted the equipment ordering company and ordered same. They placed order and it will be delivered within 5 days. I explained to Ashanti the benefits of using CPAP every night and how it effects his health. He verbalized agreement and we plan to set it up at next visit with new equipment to allow him to start using it as he is prescribed.   I reviewed appointments with him. He reports having some feet pain and toe nail issues- I gave him number to [facility] and he made an appointment for same. He will also be going  to "[facility]" to get custom compression stockings.   I reviewed meds and filled pill box for one week and recorded refills to call in tomorrow to [place] visit complete. I will see Keonta in one week.   [name], [place] 10/28/2021    Patient Care Team: Enid Skeens., MD as PCP - General (Family Medicine) Berniece Salines, DO as PCP - Cardiology (Cardiology) Jorge Ny, LCSW as Social Worker (Licensed Clinical Social Worker)  Patient Active Problem List   Diagnosis Date Noted   Dyslipidemia 07/17/2021   Lymphedema 07/08/2021   Melena    Gastric polyps    UGIB (upper gastrointestinal bleed) 04/06/2021   Acute encephalopathy 12/21/2020   COPD with acute exacerbation ([place]) 12/21/2020   Chronic diastolic (congestive) heart failure ([place]) 11/14/2020   Syncope 10/28/2020   CHF (congestive heart failure) ([place]) 10/28/2020   Chronic venous stasis dermatitis of both lower extremities 08/27/2020   Cellulitis of both lower extremities 08/14/2020   Iron deficiency anemia 08/14/2020   GERD without esophagitis 06/10/2020   Atrial fibrillation with rapid ventricular response ([place]) 03/24/2020   Acute on chronic right heart failure ([place]) 01/27/2020   Acute respiratory failure with hypoxia ([place])    Fatty liver disease, nonalcoholic    Shock ([place])    Personal  history of DVT (deep vein thrombosis) 12/26/2019   Chronic anticoagulation 12/26/2019   Palpitations 12/26/2019   Paroxysmal atrial fibrillation ([place]) 12/23/2019   Heart failure, systolic, acute ([place]) 34/74/2595   Acute on chronic congestive heart failure (HCC)    Acute on chronic diastolic CHF (congestive heart failure) ([place]) 08/23/2019   Chest pain 08/23/2019   Lactic acidosis 08/23/2019   History of pulmonary embolism 08/12/2019   Atrial tachycardia ([place]) 08/12/2019   Occasional tremors 08/12/2019   OSA (obstructive sleep apnea) 08/12/2019    AKI (acute kidney injury) ([place]) 08/08/2019   Acute on chronic right-sided heart failure ([place]) 08/06/2019   Acute pulmonary embolism ([place]) 06/08/2019   Normocytic anemia 06/08/2019   Pulmonary emboli ([place]) 06/08/2019   Chronic heart failure with preserved ejection fraction ([place]) 05/30/2019   Class 2 severe obesity due to excess calories with serious comorbidity and body mass index (BMI) of 39.0 to 39.9 in adult ([place]) 05/30/2019   Morbid obesity with BMI of 40.0-44.9, adult ([place]) 05/30/2019   Hyponatremia    Drug induced constipation    Peripheral edema    Hypotension due to drugs    Hypoalbuminemia due to protein-calorie malnutrition (HCC)    Thrombocytopenia (HCC)    Anemia of chronic disease    Cirrhosis of liver without ascites ([place])    Chronic pain syndrome    Debility 03/13/2019   Portal hypertensive gastropathy ([place]) 03/06/2019   Dyspnea    GIB (gastrointestinal bleeding) 03/04/2019   Mixed hyperlipidemia 01/24/2019   Insomnia 01/24/2019   Hyperlipidemia 01/24/2019   Essential hypertension 01/24/2019   Lumbar disc herniation 01/24/2019    Current Outpatient Medications:    apixaban (ELIQUIS) 5 MG TABS tablet, Take 1 tablet (5 mg total) by mouth 2 (two) times daily., Disp: 60 tablet, Rfl: 0   Buprenorphine HCl-Naloxone HCl 8-2 MG FILM, Place 1 tablet under the tongue 2 (two) times daily., Disp: , Rfl:    eplerenone (INSPRA) 25 MG tablet, Take 4 tablets (100 mg total) by mouth daily. (Patient not taking: Reported on 09/02/2021), Disp: 120 tablet, Rfl: 0   eplerenone (INSPRA) 50 MG tablet, TAKE 2 TABLETS BY MOUTH DAILY, Disp: 60 tablet, Rfl: 6   gabapentin (NEURONTIN) 800 MG tablet, Take 1 tablet (800 mg total) by mouth 3 (three) times daily., Disp: 90 tablet, Rfl: 1   lactulose, encephalopathy, (CHRONULAC) 10 GM/15ML SOLN, Take 30 mLs (20 g total) by mouth 3 (three) times daily., Disp: 236 mL, Rfl: 0   metolazone (ZAROXOLYN) 2.5 MG tablet, Take 1 tablet (2.5 mg total) by mouth 3  (three) times a week. Monday, Wednesday and Fridays, Disp: 20 tablet, Rfl: 0   metoprolol succinate (TOPROL XL) 25 MG 24 hr tablet, Take 1 tablet (25 mg total) by mouth at bedtime., Disp: 90 tablet, Rfl: 0   pantoprazole (PROTONIX) 40 MG tablet, Take 40 mg by mouth 2 (two) times daily before a meal., Disp: , Rfl:    polyethylene glycol (MIRALAX / GLYCOLAX) 17 g packet, Take 17 g by mouth as needed for mild constipation (mix and drink as directed)., Disp: , Rfl:    potassium chloride SA (KLOR-CON M) 20 MEQ tablet, Take 5 tablets (100 mEq total) by mouth 3 (three) times daily. Take one tablet extra (40 meq) with metolazone., Disp: 463 tablet, Rfl: 6   pravastatin (PRAVACHOL) 20 MG tablet, Take 1 tablet (20 mg total) by mouth daily., Disp: 60 tablet, Rfl: 0   sertraline (ZOLOFT) 50 MG tablet, Take 1 tablet (50 mg  total) by mouth daily., Disp: 30 tablet, Rfl: 3   tamsulosin (FLOMAX) 0.4 MG CAPS capsule, Take 1 capsule (0.4 mg total) by mouth daily., Disp: 30 capsule, Rfl: 0   torsemide (DEMADEX) 100 MG tablet, Take 1 tablet (100 mg total) by mouth twice daily., Disp: 120 tablet, Rfl: 0   triamcinolone (KENALOG) 0.025 % ointment, Apply 1 application. topically 2 (two) times daily., Disp: 30 g, Rfl: 0   VENTOLIN HFA 108 (90 Base) MCG/ACT inhaler, Inhale 1 puff into the lungs every 4 (four) hours as needed for shortness of breath., Disp: 6.7 g, Rfl: 1   zolpidem (AMBIEN) 10 MG tablet, Take 1 tablet (10 mg total) by mouth at bedtime as needed for sleep., Disp: 30 tablet, Rfl: 0 Allergies  Allergen Reactions   Penicillins Other (See Comments)    Caused convulsions as a child  Did it involve swelling of the face/tongue/throat, SOB, or low BP? N/A Did it involve sudden or severe rash/hives, skin peeling, or any reaction on the inside of your mouth or nose? N/A Did you need to seek medical attention at a hospital or doctor's office?N/A When did it last happen? N/A If all above answers are "NO", may proceed  with cephalosporin use.     Social History   Socioeconomic History   Marital status: Single    Spouse name: Not on file   Number of children: Not on file   Years of education: Not on file   Highest education level: Not on file  Occupational History   Not on file  Tobacco Use   Smoking status: Never   Smokeless tobacco: Never  Vaping Use   Vaping Use: Never used  Substance and Sexual Activity   Alcohol use: Not Currently   Drug use: Not Currently    Types: Marijuana    Comment: Smoked for 30 years   Sexual activity: Not on file  Other Topics Concern   Not on file  Social History Narrative   Not on file   Social Determinants of Health   Financial Resource Strain: Medium Risk (11/15/2020)   Overall Financial Resource Strain (CARDIA)    Difficulty of Paying Living Expenses: Somewhat hard  Food Insecurity: No Food Insecurity (11/15/2020)   Hunger Vital Sign    Worried About Running Out of Food in the Last Year: Never true    Ran Out of Food in the Last Year: Never true  Transportation Needs: Unmet Transportation Needs (11/15/2020)   PRAPARE - Hydrologist (Medical): Yes    Lack of Transportation (Non-Medical): No  Physical Activity: Not on file  Stress: Not on file  Social Connections: Not on file  Intimate Partner Violence: Not on file    Physical Exam      Future Appointments  Date Time Provider [place]  11/21/2021 10:00 AM Larey Dresser, MD [facility] None     ACTION: Home visit completed

## 2021-10-28 NOTE — Telephone Encounter (Signed)
Saw Travin in home today- he is agreeable to Furoscix now after explaining benefits. I will forward to HF clinic triage to have them forward to provider to see if he is still elligible for same as they spoke to him about this several visits ago and he declined, but he is retaining more fluid as of late and cardiomems numbers have been above goal over the last two visits.   I will follow up.  [name], [place] 10/28/2021

## 2021-10-29 ENCOUNTER — Telehealth (HOSPITAL_COMMUNITY): Payer: Self-pay

## 2021-10-29 NOTE — Telephone Encounter (Signed)
Checked Augustus's Cardiomems as of 7/19 at 1700 and it is 13 today, 11 yesterday.   I advised him to be sure to not miss any doses of medications and watch his fluid and sodium intake. I will forward to providers for follow up.   [name], [place] 10/29/2021

## 2021-11-04 ENCOUNTER — Other Ambulatory Visit (HOSPITAL_COMMUNITY): Payer: Self-pay

## 2021-11-04 NOTE — Progress Notes (Signed)
Paramedicine Encounter    Patient ID: Charles Gray, male    DOB: [birth year], 66 y.o.   MRN: [id number]  Arrived for home visit for Charles Gray who is seated in his recliner alert and oriented reporting to be feeling "okay" today. He has been having increased weight gain, swelling and above goals cardiomems readings over the last week however today his weight is down 5lbs and his cardiomems is at 10 where his goal is 9. He says the swelling has gone down some in his legs and his breathing has slightly improved but is still bothering him.   We discussed the importance of maintaining a heart healthy diet and focusing on limiting fluids and salts. I also stressed the importance of taking all medications as placed in his pill box to ensure med compliance. He verbalized understanding.   Vitals and assessment obtained: WT- 400lbs BP- 118/70 HR- 80 O2- 97% RR- 18   Lower legs red with swelling as normal with severe dryness noted. He reports he is using his creams and ointments regularly. I encouraged him to try soaking his feet and applying copious amounts of moisturizer for help with the dryness. He complains of cracks in his heels which are causing him pain- he will be seeing podiatry in August. I encouraged him to try over the counter heel creams and moisturizer with the foot soak, he agreed.   Lungs clear on assessment. He reports urinating frequently. He denied any chest pain. He did complain of some vertigo type symptoms while seated and while standing but worsening with positioning of head and some ringing in his ears. I suggested OTC Meclizine but encouraged him to schedule a PCP visit. He verbalized understanding.   I reviewed meds and confirmed same, filling pill box for one week.  No refills needed.    We reviewed upcoming appointments and confirmed same.   His home aide was present during our visit and made note of things we should be doing to maintain goal cardiomems and keeping the fluid  off of him.   Home visit complete. I will see Charles Gray in one week.   [name], EMT-Paramedic [phone] 11/04/2021     Patient Care Team: Enid Skeens., MD as PCP - General (Family Medicine) Berniece Salines, DO as PCP - Cardiology (Cardiology) Jorge Ny, LCSW as Social Worker (Licensed Clinical Social Worker)  Patient Active Problem List   Diagnosis Date Noted   Dyslipidemia 07/17/2021   Lymphedema 07/08/2021   Melena    Gastric polyps    UGIB (upper gastrointestinal bleed) 04/06/2021   Acute encephalopathy 12/21/2020   COPD with acute exacerbation ([place]) 12/21/2020   Chronic diastolic (congestive) heart failure ([place]) 11/14/2020   Syncope 10/28/2020   CHF (congestive heart failure) ([place]) 10/28/2020   Chronic venous stasis dermatitis of both lower extremities 08/27/2020   Cellulitis of both lower extremities 08/14/2020   Iron deficiency anemia 08/14/2020   GERD without esophagitis 06/10/2020   Atrial fibrillation with rapid ventricular response ([place]) 03/24/2020   Acute on chronic right heart failure ([place]) 01/27/2020   Acute respiratory failure with hypoxia (HCC)    Fatty liver disease, nonalcoholic    Shock ([place])    Personal history of DVT (deep vein thrombosis) 12/26/2019   Chronic anticoagulation 12/26/2019   Palpitations 12/26/2019   Paroxysmal atrial fibrillation ([place]) 12/23/2019   Heart failure, systolic, acute ([place]) 32/54/9826   Acute on chronic congestive heart failure (HCC)    Acute on chronic diastolic CHF (congestive heart failure) ([place])  08/23/2019   Chest pain 08/23/2019   Lactic acidosis 08/23/2019   History of pulmonary embolism 08/12/2019   Atrial tachycardia ([place]) 08/12/2019   Occasional tremors 08/12/2019   OSA (obstructive sleep apnea) 08/12/2019   AKI (acute kidney injury) ([place]) 08/08/2019   Acute on chronic right-sided heart failure ([place]) 08/06/2019   Acute pulmonary embolism ([place]) 06/08/2019   Normocytic anemia 06/08/2019   Pulmonary  emboli ([place]) 06/08/2019   Chronic heart failure with preserved ejection fraction ([place]) 05/30/2019   Class 2 severe obesity due to excess calories with serious comorbidity and body mass index (BMI) of 39.0 to 39.9 in adult [facility]) 05/30/2019   Morbid obesity with BMI of 40.0-44.9, adult ([place]) 05/30/2019   Hyponatremia    Drug induced constipation    Peripheral edema    Hypotension due to drugs    Hypoalbuminemia due to protein-calorie malnutrition (HCC)    Thrombocytopenia (HCC)    Anemia of chronic disease    Cirrhosis of liver without ascites ([place])    Chronic pain syndrome    Debility 03/13/2019   Portal hypertensive gastropathy ([employer]) 03/06/2019   Dyspnea    GIB (gastrointestinal bleeding) 03/04/2019   Mixed hyperlipidemia 01/24/2019   Insomnia 01/24/2019   Hyperlipidemia 01/24/2019   Essential hypertension 01/24/2019   Lumbar disc herniation 01/24/2019    Current Outpatient Medications:    apixaban (ELIQUIS) 5 MG TABS tablet, Take 1 tablet (5 mg total) by mouth 2 (two) times daily., Disp: 60 tablet, Rfl: 0   Buprenorphine HCl-Naloxone HCl 8-2 MG FILM, Place 1 tablet under the tongue 2 (two) times daily., Disp: , Rfl:    eplerenone (INSPRA) 25 MG tablet, Take 4 tablets (100 mg total) by mouth daily. (Patient not taking: Reported on 09/02/2021), Disp: 120 tablet, Rfl: 0   eplerenone (INSPRA) 50 MG tablet, TAKE 2 TABLETS BY MOUTH DAILY, Disp: 60 tablet, Rfl: 6   gabapentin (NEURONTIN) 800 MG tablet, Take 1 tablet (800 mg total) by mouth 3 (three) times daily., Disp: 90 tablet, Rfl: 1   lactulose, encephalopathy, (CHRONULAC) 10 GM/15ML SOLN, Take 30 mLs (20 g total) by mouth 3 (three) times daily., Disp: 236 mL, Rfl: 0   metolazone (ZAROXOLYN) 2.5 MG tablet, Take 1 tablet (2.5 mg total) by mouth 3 (three) times a week. Monday, Wednesday and Fridays, Disp: 20 tablet, Rfl: 0   metoprolol succinate (TOPROL XL) 25 MG 24 hr tablet, Take 1 tablet (25 mg total) by mouth at bedtime., Disp: 90  tablet, Rfl: 0   pantoprazole (PROTONIX) 40 MG tablet, Take 40 mg by mouth 2 (two) times daily before a meal., Disp: , Rfl:    polyethylene glycol (MIRALAX / GLYCOLAX) 17 g packet, Take 17 g by mouth as needed for mild constipation (mix and drink as directed)., Disp: , Rfl:    potassium chloride SA (KLOR-CON M) 20 MEQ tablet, Take 5 tablets (100 mEq total) by mouth 3 (three) times daily. Take one tablet extra (40 meq) with metolazone., Disp: 463 tablet, Rfl: 6   pravastatin (PRAVACHOL) 20 MG tablet, Take 1 tablet (20 mg total) by mouth daily., Disp: 60 tablet, Rfl: 0   sertraline (ZOLOFT) 50 MG tablet, Take 1 tablet (50 mg total) by mouth daily., Disp: 30 tablet, Rfl: 3   tamsulosin (FLOMAX) 0.4 MG CAPS capsule, Take 1 capsule (0.4 mg total) by mouth daily., Disp: 30 capsule, Rfl: 0   torsemide (DEMADEX) 100 MG tablet, Take 1 tablet (100 mg total) by mouth twice daily., Disp: 120 tablet, Rfl:  0   triamcinolone (KENALOG) 0.025 % ointment, Apply 1 application. topically 2 (two) times daily., Disp: 30 g, Rfl: 0   VENTOLIN HFA 108 (90 Base) MCG/ACT inhaler, Inhale 1 puff into the lungs every 4 (four) hours as needed for shortness of breath., Disp: 6.7 g, Rfl: 1   zolpidem (AMBIEN) 10 MG tablet, Take 1 tablet (10 mg total) by mouth at bedtime as needed for sleep., Disp: 30 tablet, Rfl: 0 Allergies  Allergen Reactions   Penicillins Other (See Comments)    Caused convulsions as a child  Did it involve swelling of the face/tongue/throat, SOB, or low BP? N/A Did it involve sudden or severe rash/hives, skin peeling, or any reaction on the inside of your mouth or nose? N/A Did you need to seek medical attention at a hospital or doctor's office?N/A When did it last happen? N/A If all above answers are "NO", may proceed with cephalosporin use.     Social History   Socioeconomic History   Marital status: Single    Spouse name: Not on file   Number of children: Not on file   Years of education: Not on  file   Highest education level: Not on file  Occupational History   Not on file  Tobacco Use   Smoking status: Never   Smokeless tobacco: Never  Vaping Use   Vaping Use: Never used  Substance and Sexual Activity   Alcohol use: Not Currently   Drug use: Not Currently    Types: Marijuana    Comment: Smoked for 30 years   Sexual activity: Not on file  Other Topics Concern   Not on file  Social History Narrative   Not on file   Social Determinants of Health   Financial Resource Strain: Medium Risk (11/15/2020)   Overall Financial Resource Strain (CARDIA)    Difficulty of Paying Living Expenses: Somewhat hard  Food Insecurity: No Food Insecurity (11/15/2020)   Hunger Vital Sign    Worried About Running Out of Food in the Last Year: Never true    Ran Out of Food in the Last Year: Never true  Transportation Needs: Unmet Transportation Needs (11/15/2020)   PRAPARE - Hydrologist (Medical): Yes    Lack of Transportation (Non-Medical): No  Physical Activity: Not on file  Stress: Not on file  Social Connections: Not on file  Intimate Partner Violence: Not on file    Physical Exam      Future Appointments  Date Time Provider [place]  11/21/2021 10:00 AM Larey Dresser, MD [facility] None  11/24/2021  4:00 PM Lorenda Peck, DPM [facility]     ACTION: Home visit completed

## 2021-11-11 ENCOUNTER — Other Ambulatory Visit (HOSPITAL_COMMUNITY): Payer: Self-pay

## 2021-11-11 DIAGNOSIS — F411 Generalized anxiety disorder: Secondary | ICD-10-CM | POA: Diagnosis not present

## 2021-11-11 DIAGNOSIS — F1120 Opioid dependence, uncomplicated: Secondary | ICD-10-CM | POA: Diagnosis not present

## 2021-11-11 DIAGNOSIS — F332 Major depressive disorder, recurrent severe without psychotic features: Secondary | ICD-10-CM | POA: Diagnosis not present

## 2021-11-11 DIAGNOSIS — Z6841 Body Mass Index (BMI) 40.0 and over, adult: Secondary | ICD-10-CM | POA: Diagnosis not present

## 2021-11-11 NOTE — Progress Notes (Signed)
Paramedicine Encounter    Patient ID: Charles Gray, male    DOB: 08-11-1955, 66 y.o.   MRN: [id number]  Arrived for home visit for Charles Gray who was sitting in his living room alert and oriented reporting to be feeling bad this morning. He says he feels the same he normally does in the mornings nothing new.   Charles Gray reports he has been taking his medications over the last week but admits to doing well with his diet on some days and not so well on others.  Weight is down 1 lbs this week. CardioMems yesterday was 38, today is is back down to goal at 9.   Both lower legs swollen and red as normal. Left leg weeping some- reported by Charles Gray over the weekend. He wrapped his left leg with ace bandage to help with drainage. He says RN from [facility] [employer] who comes out will only wrap left leg and not the right, as he wants both legs wrapped. I advised him to ask Dr. [name] next week for this order.    We discussed HF management and goals to decrease his weight and keeping his cardiomems at goal. [name] says he knows what he needs to do just some days are harder than others. I encouraged him and reminded him to strive to do better so he can feel better. He verbalized understanding.   He sees [facility] next week for compression stockings on Aug. 8th at 1:00.   He goes to see his Suboxone doctor in [place] at 3 today.    I reviewed and confirmed meds and filled one pill box.   Refills called into [facility]-  Gabapentin Eliquis   Home visit complete. I will see Charles Gray in one week in the home.   [name], [place] 11/11/2021    Patient Care Team: Enid Skeens., MD as PCP - General (Family Medicine) Berniece Salines, DO as PCP - Cardiology (Cardiology) Jorge Ny, LCSW as Social Worker (Licensed Clinical Social Worker)  Patient Active Problem List   Diagnosis Date Noted   Dyslipidemia 07/17/2021   Lymphedema 07/08/2021   Melena    Gastric polyps    UGIB  (upper gastrointestinal bleed) 04/06/2021   Acute encephalopathy 12/21/2020   COPD with acute exacerbation ([place]) 12/21/2020   Chronic diastolic (congestive) heart failure ([place]) 11/14/2020   Syncope 10/28/2020   CHF (congestive heart failure) ([place]) 10/28/2020   Chronic venous stasis dermatitis of both lower extremities 08/27/2020   Cellulitis of both lower extremities 08/14/2020   Iron deficiency anemia 08/14/2020   GERD without esophagitis 06/10/2020   Atrial fibrillation with rapid ventricular response ([place]) 03/24/2020   Acute on chronic right heart failure ([place]) 01/27/2020   Acute respiratory failure with hypoxia (HCC)    Fatty liver disease, nonalcoholic    Shock ([place])    Personal history of DVT (deep vein thrombosis) 12/26/2019   Chronic anticoagulation 12/26/2019   Palpitations 12/26/2019   Paroxysmal atrial fibrillation ([place]) 12/23/2019   Heart failure, systolic, acute ([place]) 62/06/5595   Acute on chronic congestive heart failure (HCC)    Acute on chronic diastolic CHF (congestive heart failure) ([place]) 08/23/2019   Chest pain 08/23/2019   Lactic acidosis 08/23/2019   History of pulmonary embolism 08/12/2019   Atrial tachycardia ([place]) 08/12/2019   Occasional tremors 08/12/2019   OSA (obstructive sleep apnea) 08/12/2019   AKI (acute kidney injury) ([place]) 08/08/2019   Acute on chronic right-sided heart failure ([place]) 08/06/2019   Acute pulmonary embolism ([place]) 06/08/2019  Normocytic anemia 06/08/2019   Pulmonary emboli ([place]) 06/08/2019   Chronic heart failure with preserved ejection fraction ([place]) 05/30/2019   Class 2 severe obesity due to excess calories with serious comorbidity and body mass index (BMI) of 39.0 to 39.9 in adult [facility]) 05/30/2019   Morbid obesity with BMI of 40.0-44.9, adult ([place]) 05/30/2019   Hyponatremia    Drug induced constipation    Peripheral edema    Hypotension due to drugs    Hypoalbuminemia due to protein-calorie malnutrition (HCC)     Thrombocytopenia (HCC)    Anemia of chronic disease    Cirrhosis of liver without ascites (HCC)    Chronic pain syndrome    Debility 03/13/2019   Portal hypertensive gastropathy ([place]) 03/06/2019   Dyspnea    GIB (gastrointestinal bleeding) 03/04/2019   Mixed hyperlipidemia 01/24/2019   Insomnia 01/24/2019   Hyperlipidemia 01/24/2019   Essential hypertension 01/24/2019   Lumbar disc herniation 01/24/2019    Current Outpatient Medications:    apixaban (ELIQUIS) 5 MG TABS tablet, Take 1 tablet (5 mg total) by mouth 2 (two) times daily., Disp: 60 tablet, Rfl: 0   Buprenorphine HCl-Naloxone HCl 8-2 MG FILM, Place 1 tablet under the tongue 2 (two) times daily., Disp: , Rfl:    eplerenone (INSPRA) 25 MG tablet, Take 4 tablets (100 mg total) by mouth daily. (Patient not taking: Reported on 09/02/2021), Disp: 120 tablet, Rfl: 0   eplerenone (INSPRA) 50 MG tablet, TAKE 2 TABLETS BY MOUTH DAILY, Disp: 60 tablet, Rfl: 6   gabapentin (NEURONTIN) 800 MG tablet, Take 1 tablet (800 mg total) by mouth 3 (three) times daily., Disp: 90 tablet, Rfl: 1   lactulose, encephalopathy, (CHRONULAC) 10 GM/15ML SOLN, Take 30 mLs (20 g total) by mouth 3 (three) times daily., Disp: 236 mL, Rfl: 0   metolazone (ZAROXOLYN) 2.5 MG tablet, Take 1 tablet (2.5 mg total) by mouth 3 (three) times a week. Monday, Wednesday and Fridays, Disp: 20 tablet, Rfl: 0   metoprolol succinate (TOPROL XL) 25 MG 24 hr tablet, Take 1 tablet (25 mg total) by mouth at bedtime., Disp: 90 tablet, Rfl: 0   pantoprazole (PROTONIX) 40 MG tablet, Take 40 mg by mouth 2 (two) times daily before a meal., Disp: , Rfl:    polyethylene glycol (MIRALAX / GLYCOLAX) 17 g packet, Take 17 g by mouth as needed for mild constipation (mix and drink as directed)., Disp: , Rfl:    potassium chloride SA (KLOR-CON M) 20 MEQ tablet, Take 5 tablets (100 mEq total) by mouth 3 (three) times daily. Take one tablet extra (40 meq) with metolazone., Disp: 463 tablet, Rfl: 6    pravastatin (PRAVACHOL) 20 MG tablet, Take 1 tablet (20 mg total) by mouth daily., Disp: 60 tablet, Rfl: 0   sertraline (ZOLOFT) 50 MG tablet, Take 1 tablet (50 mg total) by mouth daily., Disp: 30 tablet, Rfl: 3   tamsulosin (FLOMAX) 0.4 MG CAPS capsule, Take 1 capsule (0.4 mg total) by mouth daily., Disp: 30 capsule, Rfl: 0   torsemide (DEMADEX) 100 MG tablet, Take 1 tablet (100 mg total) by mouth twice daily., Disp: 120 tablet, Rfl: 0   triamcinolone (KENALOG) 0.025 % ointment, Apply 1 application. topically 2 (two) times daily., Disp: 30 g, Rfl: 0   VENTOLIN HFA 108 (90 Base) MCG/ACT inhaler, Inhale 1 puff into the lungs every 4 (four) hours as needed for shortness of breath., Disp: 6.7 g, Rfl: 1   zolpidem (AMBIEN) 10 MG tablet, Take 1 tablet (10 mg  total) by mouth at bedtime as needed for sleep., Disp: 30 tablet, Rfl: 0 Allergies  Allergen Reactions   Penicillins Other (See Comments)    Caused convulsions as a child  Did it involve swelling of the face/tongue/throat, SOB, or low BP? N/A Did it involve sudden or severe rash/hives, skin peeling, or any reaction on the inside of your mouth or nose? N/A Did you need to seek medical attention at a hospital or doctor's office?N/A When did it last happen? N/A If all above answers are "NO", may proceed with cephalosporin use.     Social History   Socioeconomic History   Marital status: Single    Spouse name: Not on file   Number of children: Not on file   Years of education: Not on file   Highest education level: Not on file  Occupational History   Not on file  Tobacco Use   Smoking status: Never   Smokeless tobacco: Never  Vaping Use   Vaping Use: Never used  Substance and Sexual Activity   Alcohol use: Not Currently   Drug use: Not Currently    Types: Marijuana    Comment: Smoked for 30 years   Sexual activity: Not on file  Other Topics Concern   Not on file  Social History Narrative   Not on file   Social Determinants of  Health   Financial Resource Strain: Medium Risk (11/15/2020)   Overall Financial Resource Strain (CARDIA)    Difficulty of Paying Living Expenses: Somewhat hard  Food Insecurity: No Food Insecurity (11/15/2020)   Hunger Vital Sign    Worried About Running Out of Food in the Last Year: Never true    Ran Out of Food in the Last Year: Never true  Transportation Needs: Unmet Transportation Needs (11/15/2020)   PRAPARE - Hydrologist (Medical): Yes    Lack of Transportation (Non-Medical): No  Physical Activity: Not on file  Stress: Not on file  Social Connections: Not on file  Intimate Partner Violence: Not on file    Physical Exam      Future Appointments  Date Time Provider [place]  11/21/2021 10:00 AM Larey Dresser, MD [facility] None  11/24/2021  4:00 PM Lorenda Peck, DPM [facility]     ACTION: Home visit completed

## 2021-11-18 ENCOUNTER — Other Ambulatory Visit (HOSPITAL_COMMUNITY): Payer: Self-pay

## 2021-11-18 MED ORDER — METOLAZONE 2.5 MG PO TABS: 2.5000 mg | ORAL_TABLET | ORAL | 0 refills | Status: DC

## 2021-11-18 NOTE — Progress Notes (Signed)
Paramedicine Encounter    Patient ID: Charles Gray, male    DOB: Sep 04, 1955, 66 y.o.   MRN: [id number]  Arrived for home visit for Charles Gray who was seated in his recliner alert and oriented reporting to be feeling okay today but continues to complain about lower leg and heel pain. Left leg wrapped by [facility] RN on Friday last week. Right leg continues to be swollen and red as normal but decreased in size this week and little to no weeping noted. Skin continues to be dry and flaky despite moisturizer.   I obtained vitals and they are as noted:  WT- 390lbs (down 10lbs from last week) BP- 118/76 HR- 72 O2-98% RR- 16 CARDIOMEMS 6 today (GOAL 9)  Charles Gray reports he has been compliant with all meds- pill box empty today indicating 100% compliance.   He is taking his Potassium out of the bottles and appears to be taking them correctly as bottle is nearing empty.   He reports he has been trying to do better about his diet over the last week and reducing the amount of fluids he is drinking. He stated that his care giver continues to help with meals.  Today he took morning meds prior to me coming out. I filled pill box accordingly for one week.   Refills as noted: Tamsulosin Sertraline Gabapentin Inspra Eliquis Metolazone  OUT OF REFILLS (request send to triage) -Tamsulosin -Metolazone -Eliquis -Metoprolol -Torsemide   We reviewed appointments coming up he confirmed he will be at Dr. Oleh Genin visit on Friday and plans to see TFA on Monday. He was supposed to see [facility] in [place] for custom compression stockings today however he moved it to an [place] office where he plans to go in the coming weeks with the assistance of his aide.  Marine scientist Therapy)  [name] advised he has applied for a downstairs single story apartment in the same complex he currently lives in and the leasing office advised he is at the top of the list once an apartment is open.  He denied any need for  assistance with food, transportation or financial things at this time.  Home visit complete. I plan to see Charles Gray in one week in the home. He knows to reach out in the mean time if needed.    Patient Care Team: Enid Skeens., MD as PCP - General (Family Medicine) Berniece Salines, DO as PCP - Cardiology (Cardiology) Jorge Ny, LCSW as Social Worker (Licensed Clinical Social Worker)  Patient Active Problem List   Diagnosis Date Noted   Dyslipidemia 07/17/2021   Lymphedema 07/08/2021   Melena    Gastric polyps    UGIB (upper gastrointestinal bleed) 04/06/2021   Acute encephalopathy 12/21/2020   COPD with acute exacerbation ([place]) 12/21/2020   Chronic diastolic (congestive) heart failure ([place]) 11/14/2020   Syncope 10/28/2020   CHF (congestive heart failure) ([place]) 10/28/2020   Chronic venous stasis dermatitis of both lower extremities 08/27/2020   Cellulitis of both lower extremities 08/14/2020   Iron deficiency anemia 08/14/2020   GERD without esophagitis 06/10/2020   Atrial fibrillation with rapid ventricular response ([place]) 03/24/2020   Acute on chronic right heart failure ([place]) 01/27/2020   Acute respiratory failure with hypoxia (HCC)    Fatty liver disease, nonalcoholic    Shock ([place])    Personal history of DVT (deep vein thrombosis) 12/26/2019   Chronic anticoagulation 12/26/2019   Palpitations 12/26/2019   Paroxysmal atrial fibrillation ([place]) 12/23/2019   Heart failure, systolic, acute (  [place]) 08/24/2019   Acute on chronic congestive heart failure (HCC)    Acute on chronic diastolic CHF (congestive heart failure) ([place]) 08/23/2019   Chest pain 08/23/2019   Lactic acidosis 08/23/2019   History of pulmonary embolism 08/12/2019   Atrial tachycardia ([place]) 08/12/2019   Occasional tremors 08/12/2019   OSA (obstructive sleep apnea) 08/12/2019   AKI (acute kidney injury) ([place]) 08/08/2019   Acute on chronic right-sided heart failure ([place]) 08/06/2019   Acute pulmonary embolism  ([place]) 06/08/2019   Normocytic anemia 06/08/2019   Pulmonary emboli ([place]) 06/08/2019   Chronic heart failure with preserved ejection fraction ([place]) 05/30/2019   Class 2 severe obesity due to excess calories with serious comorbidity and body mass index (BMI) of 39.0 to 39.9 in adult ([place]) 05/30/2019   Morbid obesity with BMI of 40.0-44.9, adult ([place]) 05/30/2019   Hyponatremia    Drug induced constipation    Peripheral edema    Hypotension due to drugs    Hypoalbuminemia due to protein-calorie malnutrition (HCC)    Thrombocytopenia (HCC)    Anemia of chronic disease    Cirrhosis of liver without ascites ([place])    Chronic pain syndrome    Debility 03/13/2019   Portal hypertensive gastropathy ([place]) 03/06/2019   Dyspnea    GIB (gastrointestinal bleeding) 03/04/2019   Mixed hyperlipidemia 01/24/2019   Insomnia 01/24/2019   Hyperlipidemia 01/24/2019   Essential hypertension 01/24/2019   Lumbar disc herniation 01/24/2019    Current Outpatient Medications:    apixaban (ELIQUIS) 5 MG TABS tablet, Take 1 tablet (5 mg total) by mouth 2 (two) times daily., Disp: 60 tablet, Rfl: 0   Buprenorphine HCl-Naloxone HCl 8-2 MG FILM, Place 1 tablet under the tongue 2 (two) times daily., Disp: , Rfl:    eplerenone (INSPRA) 25 MG tablet, Take 4 tablets (100 mg total) by mouth daily. (Patient not taking: Reported on 09/02/2021), Disp: 120 tablet, Rfl: 0   eplerenone (INSPRA) 50 MG tablet, TAKE 2 TABLETS BY MOUTH DAILY, Disp: 60 tablet, Rfl: 6   gabapentin (NEURONTIN) 800 MG tablet, Take 1 tablet (800 mg total) by mouth 3 (three) times daily., Disp: 90 tablet, Rfl: 1   lactulose, encephalopathy, (CHRONULAC) 10 GM/15ML SOLN, Take 30 mLs (20 g total) by mouth 3 (three) times daily., Disp: 236 mL, Rfl: 0   metolazone (ZAROXOLYN) 2.5 MG tablet, Take 1 tablet (2.5 mg total) by mouth 3 (three) times a week. Monday, Wednesday and Fridays, Disp: 20 tablet, Rfl: 0   metoprolol succinate (TOPROL XL) 25 MG 24 hr tablet,  Take 1 tablet (25 mg total) by mouth at bedtime., Disp: 90 tablet, Rfl: 0   pantoprazole (PROTONIX) 40 MG tablet, Take 40 mg by mouth 2 (two) times daily before a meal., Disp: , Rfl:    polyethylene glycol (MIRALAX / GLYCOLAX) 17 g packet, Take 17 g by mouth as needed for mild constipation (mix and drink as directed)., Disp: , Rfl:    potassium chloride SA (KLOR-CON M) 20 MEQ tablet, Take 5 tablets (100 mEq total) by mouth 3 (three) times daily. Take one tablet extra (40 meq) with metolazone., Disp: 463 tablet, Rfl: 6   pravastatin (PRAVACHOL) 20 MG tablet, Take 1 tablet (20 mg total) by mouth daily., Disp: 60 tablet, Rfl: 0   sertraline (ZOLOFT) 50 MG tablet, Take 1 tablet (50 mg total) by mouth daily., Disp: 30 tablet, Rfl: 3   tamsulosin (FLOMAX) 0.4 MG CAPS capsule, Take 1 capsule (0.4 mg total) by mouth daily., Disp: 30 capsule,  Rfl: 0   torsemide (DEMADEX) 100 MG tablet, Take 1 tablet (100 mg total) by mouth twice daily., Disp: 120 tablet, Rfl: 0   triamcinolone (KENALOG) 0.025 % ointment, Apply 1 application. topically 2 (two) times daily., Disp: 30 g, Rfl: 0   VENTOLIN HFA 108 (90 Base) MCG/ACT inhaler, Inhale 1 puff into the lungs every 4 (four) hours as needed for shortness of breath., Disp: 6.7 g, Rfl: 1   zolpidem (AMBIEN) 10 MG tablet, Take 1 tablet (10 mg total) by mouth at bedtime as needed for sleep., Disp: 30 tablet, Rfl: 0 Allergies  Allergen Reactions   Penicillins Other (See Comments)    Caused convulsions as a child  Did it involve swelling of the face/tongue/throat, SOB, or low BP? N/A Did it involve sudden or severe rash/hives, skin peeling, or any reaction on the inside of your mouth or nose? N/A Did you need to seek medical attention at a hospital or doctor's office?N/A When did it last happen? N/A If all above answers are "NO", may proceed with cephalosporin use.     Social History   Socioeconomic History   Marital status: Single    Spouse name: Not on file    Number of children: Not on file   Years of education: Not on file   Highest education level: Not on file  Occupational History   Not on file  Tobacco Use   Smoking status: Never   Smokeless tobacco: Never  Vaping Use   Vaping Use: Never used  Substance and Sexual Activity   Alcohol use: Not Currently   Drug use: Not Currently    Types: Marijuana    Comment: Smoked for 30 years   Sexual activity: Not on file  Other Topics Concern   Not on file  Social History Narrative   Not on file   Social Determinants of Health   Financial Resource Strain: Medium Risk (11/15/2020)   Overall Financial Resource Strain (CARDIA)    Difficulty of Paying Living Expenses: Somewhat hard  Food Insecurity: No Food Insecurity (11/15/2020)   Hunger Vital Sign    Worried About Running Out of Food in the Last Year: Never true    Ran Out of Food in the Last Year: Never true  Transportation Needs: Unmet Transportation Needs (11/15/2020)   PRAPARE - Hydrologist (Medical): Yes    Lack of Transportation (Non-Medical): No  Physical Activity: Not on file  Stress: Not on file  Social Connections: Not on file  Intimate Partner Violence: Not on file    Physical Exam      Future Appointments  Date Time Provider [place]  11/21/2021 10:00 AM Larey Dresser, MD [facility] None  11/24/2021  4:00 PM Lorenda Peck, DPM [facility]     ACTION: Home visit completed

## 2021-11-19 ENCOUNTER — Telehealth (HOSPITAL_COMMUNITY): Payer: Self-pay | Admitting: Cardiology

## 2021-11-19 MED ORDER — APIXABAN 5 MG PO TABS: 5.0000 mg | ORAL_TABLET | Freq: Two times a day (BID) | ORAL | 3 refills | Status: DC

## 2021-11-19 MED ORDER — TAMSULOSIN HCL 0.4 MG PO CAPS: 0.4000 mg | ORAL_CAPSULE | Freq: Every day | ORAL | 0 refills | Status: DC

## 2021-11-19 MED ORDER — TORSEMIDE 100 MG PO TABS: 100.0000 mg | ORAL_TABLET | ORAL | 3 refills | Status: DC

## 2021-11-19 MED ORDER — METOPROLOL SUCCINATE ER 25 MG PO TB24: 25.0000 mg | ORAL_TABLET | Freq: Every day | ORAL | 3 refills | Status: DC

## 2021-11-19 MED ORDER — PRAVASTATIN SODIUM 20 MG PO TABS: 20.0000 mg | ORAL_TABLET | Freq: Every day | ORAL | 3 refills | Status: DC

## 2021-11-19 NOTE — Telephone Encounter (Signed)
-----   Message from [name], EMT sent at 11/18/2021  6:53 PM EDT ----- Regarding: out of refills at pharmacy [place] is needing the following sent to his pharmacy for refills please:  -Tamsulosin -Metolazone -Eliquis -Torsemide -Pravastatin -Metoprolol    Thanks!   [name], [place] 11/18/2021

## 2021-11-21 ENCOUNTER — Ambulatory Visit (HOSPITAL_COMMUNITY)
Admission: RE | Admit: 2021-11-21 | Discharge: 2021-11-21 | Disposition: A | Discharge status: Home / Self Care | Payer: Medicare HMO | Source: Ambulatory Visit | Attending: Cardiology | Admitting: Cardiology

## 2021-11-21 VITALS — BP 125/80 | HR 80 | Wt 394.0 lb

## 2021-11-21 DIAGNOSIS — I872 Venous insufficiency (chronic) (peripheral): Secondary | ICD-10-CM | POA: Diagnosis not present

## 2021-11-21 DIAGNOSIS — I2510 Atherosclerotic heart disease of native coronary artery without angina pectoris: Secondary | ICD-10-CM | POA: Insufficient documentation

## 2021-11-21 DIAGNOSIS — F419 Anxiety disorder, unspecified: Secondary | ICD-10-CM | POA: Diagnosis not present

## 2021-11-21 DIAGNOSIS — K3189 Other diseases of stomach and duodenum: Secondary | ICD-10-CM | POA: Insufficient documentation

## 2021-11-21 DIAGNOSIS — Z86711 Personal history of pulmonary embolism: Secondary | ICD-10-CM | POA: Diagnosis not present

## 2021-11-21 DIAGNOSIS — Z6841 Body Mass Index (BMI) 40.0 and over, adult: Secondary | ICD-10-CM | POA: Diagnosis not present

## 2021-11-21 DIAGNOSIS — F32A Depression, unspecified: Secondary | ICD-10-CM | POA: Insufficient documentation

## 2021-11-21 DIAGNOSIS — E669 Obesity, unspecified: Secondary | ICD-10-CM | POA: Insufficient documentation

## 2021-11-21 DIAGNOSIS — I110 Hypertensive heart disease with heart failure: Secondary | ICD-10-CM | POA: Insufficient documentation

## 2021-11-21 DIAGNOSIS — K7460 Unspecified cirrhosis of liver: Secondary | ICD-10-CM | POA: Insufficient documentation

## 2021-11-21 DIAGNOSIS — Z79899 Other long term (current) drug therapy: Secondary | ICD-10-CM | POA: Insufficient documentation

## 2021-11-21 DIAGNOSIS — Z86718 Personal history of other venous thrombosis and embolism: Secondary | ICD-10-CM | POA: Diagnosis not present

## 2021-11-21 DIAGNOSIS — Z7901 Long term (current) use of anticoagulants: Secondary | ICD-10-CM | POA: Diagnosis not present

## 2021-11-21 DIAGNOSIS — R2689 Other abnormalities of gait and mobility: Secondary | ICD-10-CM | POA: Diagnosis not present

## 2021-11-21 DIAGNOSIS — I480 Paroxysmal atrial fibrillation: Secondary | ICD-10-CM | POA: Diagnosis not present

## 2021-11-21 DIAGNOSIS — K5909 Other constipation: Secondary | ICD-10-CM | POA: Diagnosis not present

## 2021-11-21 DIAGNOSIS — K766 Portal hypertension: Secondary | ICD-10-CM | POA: Diagnosis not present

## 2021-11-21 DIAGNOSIS — L539 Erythematous condition, unspecified: Secondary | ICD-10-CM | POA: Diagnosis not present

## 2021-11-21 DIAGNOSIS — G4733 Obstructive sleep apnea (adult) (pediatric): Secondary | ICD-10-CM | POA: Diagnosis not present

## 2021-11-21 DIAGNOSIS — J449 Chronic obstructive pulmonary disease, unspecified: Secondary | ICD-10-CM | POA: Insufficient documentation

## 2021-11-21 DIAGNOSIS — R6889 Other general symptoms and signs: Secondary | ICD-10-CM | POA: Diagnosis not present

## 2021-11-21 DIAGNOSIS — I5032 Chronic diastolic (congestive) heart failure: Secondary | ICD-10-CM

## 2021-11-21 DIAGNOSIS — D649 Anemia, unspecified: Secondary | ICD-10-CM | POA: Diagnosis not present

## 2021-11-21 LAB — BASIC METABOLIC PANEL
Anion gap: 12 (ref 5–15)
BUN: 15 mg/dL (ref 8–23)
CO2: 32 mmol/L (ref 22–32)
Calcium: 8.9 mg/dL (ref 8.9–10.3)
Chloride: 93 mmol/L — ABNORMAL LOW (ref 98–111)
Creatinine, Ser: 0.97 mg/dL (ref 0.61–1.24)
GFR, Estimated: >60 mL/min (ref >60)
Glucose, Bld: 154 mg/dL — ABNORMAL HIGH (ref 70–99)
Potassium: 3.3 mmol/L — ABNORMAL LOW (ref 3.5–5.1)
Sodium: 137 mmol/L (ref 135–145)

## 2021-11-21 LAB — BRAIN NATRIURETIC PEPTIDE: B Natriuretic Peptide: 24.3 pg/mL (ref 0.0–100.0)

## 2021-11-21 NOTE — Patient Instructions (Addendum)
EKG done today.  Labs done today. We will contact you only if your labs are abnormal.  No medication changes were made. Please continue all current medications as prescribed.  You have been referred to the Pharmacy Clinic and the Healthy weight and wellness clinic. They will contact you to schedule an appointment.   Your physician recommends that you schedule a follow-up appointment in: 3 months with our NP/PA Clinic here in our office.   If you have any questions or concerns before your next appointment please send [place] a message through [place] or call our office at [phone].    TO LEAVE A MESSAGE FOR THE NURSE SELECT OPTION 2, PLEASE LEAVE A MESSAGE INCLUDING: YOUR NAME DATE OF BIRTH CALL BACK NUMBER REASON FOR CALL**this is important as we prioritize the call backs  YOU WILL RECEIVE A CALL BACK THE SAME DAY AS LONG AS YOU CALL BEFORE 4:00 PM   Do the following things EVERYDAY: Weigh yourself in the morning before breakfast. Write it down and keep it in a log. Take your medicines as prescribed Eat low salt foods--Limit salt (sodium) to 2000 mg per day.  Stay as active as you can everyday Limit all fluids for the day to less than 2 liters   At the [facility], you and your health needs are our priority. As part of our continuing mission to provide you with exceptional heart care, we have created designated Provider Care Teams. These Care Teams include your primary Cardiologist (physician) and Advanced Practice Providers (APPs- Physician Assistants and Nurse Practitioners) who all work together to provide you with the care you need, when you need it.   You may see any of the following providers on your designated Care Team at your next follow up: Dr Glori Bickers Dr Haynes Kerns, NP Lyda Jester, [place] Audry Riles, PharmD   Please be sure to bring in all your medications bottles to every appointment.

## 2021-11-21 NOTE — Addendum Note (Signed)
Encounter addended by: Larey Dresser, MD on: 11/21/2021 1:05 PM  Actions taken: Level of Service modified

## 2021-11-21 NOTE — Progress Notes (Signed)
ADVANCED HF CLINIC NOTE  Primary Care: Enid Skeens., MD Primary Cardiologist: Dr. Berniece Salines, DO  HF Cardiology: Dr. [name]  HPI: 66 y.o. 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    He has had multiple hospitalizations in 2021 for CHF exacerbation, requiring IV Lasix and change in PO regimen from lasix to torsemide. Echo 4/21 showed normal LVEF 55-60% w/ G1DD. RV not well visualized, but felt to have component of RV failure. [place] 4/21 showed normal PA pressure and normal cardiac output. PFTs also completed and c/w mild obstructive airway disease.   In 7/21, he had a coronary CTA.  This showed no coronary calcium but there was a concern for soft plaque in the distal LCx and distal RCA with hemodynamically significant stenosis.  He was managed medically. He has not had any chest pain, but was started on ranolazine after his CTA.  He feels like it makes his "head fuzzy."    Admitted with A/C diastolic heart failure 38/1829. Diuresed with IV lasix and transitioned to bumex 3 mg twice a day. Discharge weight 360 pounds.   He was admitted again in 12/21 with CHF.  Echo in 12/21 showed EF 93-71%, grade 2 diastolic dysfunction, RV poorly visualized.   Admitted 6/96/78 with A/C diastolic heart failure. Diuresed with IV lasix + metolazone. Transitioned to torsemide 60 mg twice a day and metolazone twice a day. D/C weight 364 pounds.   Admitted 5/28//22 with cellulitis. Treated with antibiotics.   Admitted 09/20/20 with  A/C CHF. He diuresed 5 L with IV lasix, weight 371 at discharge.  He was readmitted for A/C CHF 7/22. He was diuresed with IV lasix + metolazone. RHC showed  optimized filling pressures and preserved CO/CI. He was discharged on torsemide 100 mg bid + metolazone M/W/F. Discharge weight 374 lbs.  RHC (8/22) showed normal filling pressures & he underwent Cardiomems  placement.  Admitted 9/10-9/12/22 for acute encephalopathy, felt secondary to polypharmacy. Ambien and gabapentin were briefly held. Ammonia mildly elevated and he was started on lactulose. Mentation returned to baseline and he was discharged home with Home Health PT/OT.  Last seen in for f/u 02/05/21. Appeared slightly volume up. Given extra dose of metolazone. It was felt that most of leg edema likely due to venous insufficiency. Needs better adherence with UNNA boots/compression stockings. Now has home health and paramedicine.   Saw ID for suspected cellulitis. No active infection identified. Referral to lymphedema clinic submitted.   Admitted 3/23 with a/c dHF and worsening LE edema. Echo showed EF 60-65% RV normal this admission. He was diuresed with IV Lasix, down 20 lbs. LE venous dopplers negative DVT. Hospitalization complicated by severe  hypokalemia, discharged home on KCL 100 tid, weight 377.7 lbs. Cardiomems goal lowered to 9.   Today he returns for followup of CHF.  Weight is up today.  He reports snacking at night, and he is very inactive.  He has chronic lower extremity redness that has not changed.  He is short of breath walking around his house and fatigues easily.  He walks with a walker.  Poor balance but denies falls.  No chest pain.  No orthopnea/PND.   Cardiomems: PAD 8 on 11/20/21, (goal 9).   ECG (personally reviewed): NSR, LAFB  PMH: 1. HTN 2. OSA 3. Hyperlipidemia 4. Low back pain 5. PE/DVT in 2/21.  - V/Q scan in 6/21 was not suggestive of chronic PE.  6. Nonalcoholic steatohepatitis.  7. GI bleed in 10/20.  8. Anxiety 9. CAD: Coronary CTA in 7/21 with calcium score 0 but concern for soft plaque distal CFX and RCA.  By [facility], there was flow-limiting stenosis in distal CFX and distal RCA.  10. Chronic diastolic CHF: Echo in 0/09 with EF 55-60%, RV poorly visualized.  - RHC (4/21): mean RA 8, PA 20/8, mean PCWP 11, CI 2.8 - Echo (12/21): EF 60-65%, grade 2  diastolic dysfunction, RV poorly visualized.   - RHC (8/22) normal filling pressures, RA mean 7, RV 29/9, PA 32/15, mean 22, PCWP mean 13, CO/CI 7.32/2.41. - Cardiomems (8/22) placement. - Echo (3/23): EF 60-65%, mild LVH, normal RV 11. PFTs (4/21): Mild obstruction.  12. ABIs (10/21): Normal.  Review of Systems: All systems reviewed and negative except as per HPI.   Current Outpatient Medications  Medication Sig Dispense Refill   apixaban (ELIQUIS) 5 MG TABS tablet Take 1 tablet (5 mg total) by mouth 2 (two) times daily. 60 tablet 3   Buprenorphine HCl-Naloxone HCl 8-2 MG FILM Place 1 tablet under the tongue 2 (two) times daily.     eplerenone (INSPRA) 50 MG tablet TAKE 2 TABLETS BY MOUTH DAILY 60 tablet 6   gabapentin (NEURONTIN) 800 MG tablet Take 1 tablet (800 mg total) by mouth 3 (three) times daily. 90 tablet 1   lactulose, encephalopathy, (CHRONULAC) 10 GM/15ML SOLN Take 30 mLs (20 g total) by mouth 3 (three) times daily. 236 mL 0   metolazone (ZAROXOLYN) 2.5 MG tablet Take 1 tablet (2.5 mg total) by mouth 3 (three) times a week. Monday, Wednesday and Fridays 20 tablet 0   metoprolol succinate (TOPROL XL) 25 MG 24 hr tablet Take 1 tablet (25 mg total) by mouth at bedtime. 30 tablet 3   pantoprazole (PROTONIX) 40 MG tablet Take 40 mg by mouth 2 (two) times daily before a meal.     polyethylene glycol (MIRALAX / GLYCOLAX) 17 g packet Take 17 g by mouth as needed for mild constipation (mix and drink as directed).     potassium chloride SA (KLOR-CON M) 20 MEQ tablet Take 5 tablets (100 mEq total) by mouth 3 (three) times daily. Take one tablet extra (40 meq) with metolazone. 463 tablet 6   pravastatin (PRAVACHOL) 20 MG tablet Take 1 tablet (20 mg total) by mouth daily. 30 tablet 3   sertraline (ZOLOFT) 50 MG tablet Take 1 tablet (50 mg total) by mouth daily. 30 tablet 3   tamsulosin (FLOMAX) 0.4 MG CAPS capsule Take 1 capsule (0.4 mg total) by mouth daily. Additional refills will need to  come from PCP 30 capsule 0   torsemide (DEMADEX) 100 MG tablet Take 1 tablet (100 mg total) by mouth twice daily. 60 tablet 3   triamcinolone (KENALOG) 0.025 % ointment Apply 1 application. topically 2 (two) times daily. 30 g 0   VENTOLIN HFA 108 (90 Base) MCG/ACT inhaler Inhale 1 puff into the lungs every 4 (four) hours as needed for shortness of breath. 6.7 g 1   zolpidem (AMBIEN) 10 MG tablet Take 1 tablet (10 mg total) by mouth at bedtime as needed for sleep. 30 tablet 0   No current facility-administered medications for this encounter.   Allergies  Allergen Reactions   Penicillins Other (See Comments)    Caused convulsions as a child  Did it involve swelling of the face/tongue/throat, SOB, or low BP? N/A Did it involve sudden or severe rash/hives, skin peeling, or any reaction  on the inside of your mouth or nose? N/A Did you need to seek medical attention at a hospital or doctor's office?N/A When did it last happen? N/A If all above answers are "NO", may proceed with cephalosporin use.   Social History   Socioeconomic History   Marital status: Single    Spouse name: Not on file   Number of children: Not on file   Years of education: Not on file   Highest education level: Not on file  Occupational History   Not on file  Tobacco Use   Smoking status: Never   Smokeless tobacco: Never  Vaping Use   Vaping Use: Never used  Substance and Sexual Activity   Alcohol use: Not Currently   Drug use: Not Currently    Types: Marijuana    Comment: Smoked for 30 years   Sexual activity: Not on file  Other Topics Concern   Not on file  Social History Narrative   Not on file   Social Determinants of Health   Financial Resource Strain: Medium Risk (11/15/2020)   Overall Financial Resource Strain (CARDIA)    Difficulty of Paying Living Expenses: Somewhat hard  Food Insecurity: No Food Insecurity (11/15/2020)   Hunger Vital Sign    Worried About Running Out of Food in the Last Year:  Never true    Ran Out of Food in the Last Year: Never true  Transportation Needs: Unmet Transportation Needs (11/15/2020)   PRAPARE - Hydrologist (Medical): Yes    Lack of Transportation (Non-Medical): No  Physical Activity: Not on file  Stress: Not on file  Social Connections: Not on file  Intimate Partner Violence: Not on file    Family History  Problem Relation Age of Onset   Hyperlipidemia Mother    Hypertension Mother    Stroke Mother    CAD Maternal Grandmother    CAD Maternal Grandfather    BP 125/80   Pulse 80   Wt (!) 178.7 kg (394 lb)   SpO2 94%   BMI 41.20 kg/m   Wt Readings from Last 3 Encounters:  11/21/21 (!) 178.7 kg (394 lb)  11/18/21 (!) 176.9 kg (390 lb)  11/11/21 (!) 181 kg (399 lb)   PHYSICAL EXAM: General: NAD, obese Neck: No JVD, no thyromegaly or thyroid nodule.  Lungs: Clear to auscultation bilaterally with normal respiratory effort. CV: Nondisplaced PMI.  Heart regular S1/S2, no S3/S4, no murmur.  Chronic LE edema.  No carotid bruit.  Unable to palpate pedal pulses.  Abdomen: Soft, nontender, no hepatosplenomegaly, no distention.  Skin: Intact without lesions or rashes.  Neurologic: Alert and oriented x 3.  Psych: Normal affect. Extremities: No clubbing or cyanosis.  Chronic lower leg erythema.  HEENT: Normal.   ASSESSMENT & PLAN: 1. Chronic Diastolic CHF with suspected RV dysfunction: Echo 7/22 with EF 60-65%, mild LVH, normal RV, unable to estimate PA systolic pressure. Multiple CHF admissions despite aggressive outpatient diuretic regimen and paramedicine.  RHC (7/22) with optimized filling pressures, PCWP 13, CO/CI 7.3/2.4. Received IV lasix 80 mg through paramedicine 11/12/20. RHC (8/22) with normal filling pressures; underwent placement of Cardiomems (dPAP goal 9 mmHg). Re-admitted w/ marked volume overload and slight progression of symptoms 3/23. Echo 3/23 EF 60-65% RV normal this admission.  Today, NYHA III  symptoms (chronic), functional class difficult due to body habitus and physical inactivity. He does not appear markedly volume overloaded on exam and Cardiomems below goal at 8, he continues with peripheral  edema but think this is due to chronic venous insufficiency at this point than CHF.  - Continue torsemide 100 mg bid + 2.5 mg metolazone M, W, F.  BMET/BNP.  - Continue KCl 100 mEq tid - c/w unna boots, HH nurse to place tomorrow (needs on both legs).  - Continue eplerenone 100 mg daily.  - Off Jardiance (GI upset). - Continue Toprol XL 25 mg daily.  2. H/o PE/DVT: Suspect due to sedentary lifestyle.  Occurred in 2/21.  V/Q scan in 6/21 did not show evidence for chronic PE.  CT scan this admit limited because of suboptimal opacification and artifact, however is suggestive of bilateral lower lobe segmental and subsegmental pulm emboli but no evidence of Rt heart strain but not certain of diagnosis due to quality of scan. Unable to get V/Q scan this admission due to size. LE venous dopplers negative for DVT - Continue Eliquis.  3. OSA: Cannot tolerate CPAP. 4. Anxiety/depression: This is a major issue.   - Continue sertraline 50 mg daily.  5. CAD: Cath 1/22 with no significant CAD. No ischemic CP. No chest pain.  - On statin  6. Hypertension: Stable. Continue current meds. 7. Atrial fibrillation: Paroxysmal. In SR today.  - Continue apixaban.  - Continue Toprol XL. 8. Bilateral Lower extremity erythema: Admitted 5/22 with possible BLE cellulitis. Since then, he has been on multiple rounds of Keflex, Levaquin and doxycycline over the last several months. Previously discussed with Janene Madeira, NP with ID, and recommended starting cefadroxil 1000 mg bid x 7 days, however he took this for 2 days and stopped due to GI upset. Compliance has remained a barrier. He continues w/ b/l LE erythema on exam but suspect most likely due to chronic venous stasis/ dependent rubor + CHF. Doubt cellulitis.  -  Continue Unna boots. 9. Cirrhosis: CT scan noted nodular contour of liver, compatible with underlying cirrhosis. In setting of known RV dysfunction. LFTs ok. EGD 12/22 showed Portal hypertensive gastropathy. No varices.  - On lactulose (also takes this for chronic constipation)  - on eplerenone + ? blocker  10. Anemia: Stable CBC.  11. Obesity: Major issue. - I will refer to pharmacy clinic for semaglutide.  - I will refer to [facility] clinic. 12. H/o Polymorphic VT: brief, in setting of hypokalemia.  Followup 3 months APP.   Loralie Champagne 11/21/2021

## 2021-11-24 ENCOUNTER — Ambulatory Visit: Payer: Medicare HMO | Admitting: Podiatry

## 2021-11-25 ENCOUNTER — Other Ambulatory Visit (HOSPITAL_COMMUNITY): Payer: Self-pay

## 2021-11-25 DIAGNOSIS — F411 Generalized anxiety disorder: Secondary | ICD-10-CM | POA: Diagnosis not present

## 2021-11-25 DIAGNOSIS — F1120 Opioid dependence, uncomplicated: Secondary | ICD-10-CM | POA: Diagnosis not present

## 2021-11-25 DIAGNOSIS — F332 Major depressive disorder, recurrent severe without psychotic features: Secondary | ICD-10-CM | POA: Diagnosis not present

## 2021-11-25 NOTE — Progress Notes (Signed)
Paramedicine Encounter    Patient ID: Charles Gray, male    DOB: Mar 11, 1956, 65 y.o.   MRN: [id number]   Arrived for home visit for Charles Gray who reports feeling bad today- he reports generalized pain and body aches, leg swelling and shortness of breath. He denied chest pain or dizziness. These complaints are normal for him during our visits. Both lower legs red and swollen- no more than normal. He reports the nurse will be coming out to wrap his legs tomorrow. He says she never got an order to wrap the right leg too so she will only be wrapping the left. I called the HF clinic and they advised for him to reach out to PCP for leg wrap orders. I will contact them.   CARDIO MEMS today is 7.   Vitals: WT- 393lbs BP- 140/78 HR- 82 RR- 18 O2- 97%  I reviewed meds and confirmed same filling pill box for one week.  Refills: Torsemide   We reviewed upcoming appointments and confirmed same. He will be having a tele-health visit today at 2:00 with his Suboxone doctor.   I will come out in one week. He agreed with plan and knows to reach out to me if needed in the mean time. Visit complete.   Charles Gray, [place] 11/25/2021    Patient Care Team: Enid Skeens., MD as PCP - General (Family Medicine) Berniece Salines, DO as PCP - Cardiology (Cardiology) Jorge Ny, LCSW as Social Worker (Licensed Clinical Social Worker)  Patient Active Problem List   Diagnosis Date Noted   Dyslipidemia 07/17/2021   Lymphedema 07/08/2021   Melena    Gastric polyps    UGIB (upper gastrointestinal bleed) 04/06/2021   Acute encephalopathy 12/21/2020   COPD with acute exacerbation ([place]) 12/21/2020   Chronic diastolic (congestive) heart failure ([place]) 11/14/2020   Syncope 10/28/2020   CHF (congestive heart failure) ([place]) 10/28/2020   Chronic venous stasis dermatitis of both lower extremities 08/27/2020   Cellulitis of both lower extremities 08/14/2020   Iron deficiency anemia  08/14/2020   GERD without esophagitis 06/10/2020   Atrial fibrillation with rapid ventricular response ([place]) 03/24/2020   Acute on chronic right heart failure ([place]) 01/27/2020   Acute respiratory failure with hypoxia (HCC)    Fatty liver disease, nonalcoholic    Shock ([place])    Personal history of DVT (deep vein thrombosis) 12/26/2019   Chronic anticoagulation 12/26/2019   Palpitations 12/26/2019   Paroxysmal atrial fibrillation ([place]) 12/23/2019   Heart failure, systolic, acute ([place]) 21/19/4174   Acute on chronic congestive heart failure (HCC)    Acute on chronic diastolic CHF (congestive heart failure) ([place]) 08/23/2019   Chest pain 08/23/2019   Lactic acidosis 08/23/2019   History of pulmonary embolism 08/12/2019   Atrial tachycardia ([place]) 08/12/2019   Occasional tremors 08/12/2019   OSA (obstructive sleep apnea) 08/12/2019   AKI (acute kidney injury) ([place]) 08/08/2019   Acute on chronic right-sided heart failure ([place]) 08/06/2019   Acute pulmonary embolism ([place]) 06/08/2019   Normocytic anemia 06/08/2019   Pulmonary emboli ([place]) 06/08/2019   Chronic heart failure with preserved ejection fraction ([place]) 05/30/2019   Class 2 severe obesity due to excess calories with serious comorbidity and body mass index (BMI) of 39.0 to 39.9 in adult ([place]) 05/30/2019   Morbid obesity with BMI of 40.0-44.9, adult ([place]) 05/30/2019   Hyponatremia    Drug induced constipation    Peripheral edema    Hypotension due to drugs    Hypoalbuminemia due  to protein-calorie malnutrition ([place])    Thrombocytopenia ([place])    Anemia of chronic disease    Cirrhosis of liver without ascites (HCC)    Chronic pain syndrome    Debility 03/13/2019   Portal hypertensive gastropathy ([place]) 03/06/2019   Dyspnea    GIB (gastrointestinal bleeding) 03/04/2019   Mixed hyperlipidemia 01/24/2019   Insomnia 01/24/2019   Hyperlipidemia 01/24/2019   Essential hypertension 01/24/2019   Lumbar disc herniation 01/24/2019     Current Outpatient Medications:    apixaban (ELIQUIS) 5 MG TABS tablet, Take 1 tablet (5 mg total) by mouth 2 (two) times daily., Disp: 60 tablet, Rfl: 3   Buprenorphine HCl-Naloxone HCl 8-2 MG FILM, Place 1 tablet under the tongue 2 (two) times daily., Disp: , Rfl:    eplerenone (INSPRA) 50 MG tablet, TAKE 2 TABLETS BY MOUTH DAILY, Disp: 60 tablet, Rfl: 6   gabapentin (NEURONTIN) 800 MG tablet, Take 1 tablet (800 mg total) by mouth 3 (three) times daily., Disp: 90 tablet, Rfl: 1   lactulose, encephalopathy, (CHRONULAC) 10 GM/15ML SOLN, Take 30 mLs (20 g total) by mouth 3 (three) times daily., Disp: 236 mL, Rfl: 0   metolazone (ZAROXOLYN) 2.5 MG tablet, Take 1 tablet (2.5 mg total) by mouth 3 (three) times a week. Monday, Wednesday and Fridays, Disp: 20 tablet, Rfl: 0   metoprolol succinate (TOPROL XL) 25 MG 24 hr tablet, Take 1 tablet (25 mg total) by mouth at bedtime., Disp: 30 tablet, Rfl: 3   pantoprazole (PROTONIX) 40 MG tablet, Take 40 mg by mouth 2 (two) times daily before a meal., Disp: , Rfl:    polyethylene glycol (MIRALAX / GLYCOLAX) 17 g packet, Take 17 g by mouth as needed for mild constipation (mix and drink as directed)., Disp: , Rfl:    potassium chloride SA (KLOR-CON M) 20 MEQ tablet, Take 5 tablets (100 mEq total) by mouth 3 (three) times daily. Take one tablet extra (40 meq) with metolazone., Disp: 463 tablet, Rfl: 6   pravastatin (PRAVACHOL) 20 MG tablet, Take 1 tablet (20 mg total) by mouth daily., Disp: 30 tablet, Rfl: 3   sertraline (ZOLOFT) 50 MG tablet, Take 1 tablet (50 mg total) by mouth daily., Disp: 30 tablet, Rfl: 3   tamsulosin (FLOMAX) 0.4 MG CAPS capsule, Take 1 capsule (0.4 mg total) by mouth daily. Additional refills will need to come from PCP, Disp: 30 capsule, Rfl: 0   torsemide (DEMADEX) 100 MG tablet, Take 1 tablet (100 mg total) by mouth twice daily., Disp: 60 tablet, Rfl: 3   triamcinolone (KENALOG) 0.025 % ointment, Apply 1 application. topically 2 (two)  times daily., Disp: 30 g, Rfl: 0   VENTOLIN HFA 108 (90 Base) MCG/ACT inhaler, Inhale 1 puff into the lungs every 4 (four) hours as needed for shortness of breath., Disp: 6.7 g, Rfl: 1   zolpidem (AMBIEN) 10 MG tablet, Take 1 tablet (10 mg total) by mouth at bedtime as needed for sleep., Disp: 30 tablet, Rfl: 0 Allergies  Allergen Reactions   Penicillins Other (See Comments)    Caused convulsions as a child  Did it involve swelling of the face/tongue/throat, SOB, or low BP? N/A Did it involve sudden or severe rash/hives, skin peeling, or any reaction on the inside of your mouth or nose? N/A Did you need to seek medical attention at a hospital or doctor's office?N/A When did it last happen? N/A If all above answers are "NO", may proceed with cephalosporin use.     Social History  Socioeconomic History   Marital status: Single    Spouse name: Not on file   Number of children: Not on file   Years of education: Not on file   Highest education level: Not on file  Occupational History   Not on file  Tobacco Use   Smoking status: Never   Smokeless tobacco: Never  Vaping Use   Vaping Use: Never used  Substance and Sexual Activity   Alcohol use: Not Currently   Drug use: Not Currently    Types: Marijuana    Comment: Smoked for 30 years   Sexual activity: Not on file  Other Topics Concern   Not on file  Social History Narrative   Not on file   Social Determinants of Health   Financial Resource Strain: Medium Risk (11/15/2020)   Overall Financial Resource Strain (CARDIA)    Difficulty of Paying Living Expenses: Somewhat hard  Food Insecurity: No Food Insecurity (11/15/2020)   Hunger Vital Sign    Worried About Running Out of Food in the Last Year: Never true    Ran Out of Food in the Last Year: Never true  Transportation Needs: Unmet Transportation Needs (11/15/2020)   PRAPARE - Hydrologist (Medical): Yes    Lack of Transportation (Non-Medical): No   Physical Activity: Not on file  Stress: Not on file  Social Connections: Not on file  Intimate Partner Violence: Not on file    Physical Exam      Future Appointments  Date Time Provider [place]  12/24/2021  3:30 PM Lorenda Peck, DPM [facility]  02/23/2022  9:00 AM [facility] PA/NP [facility] None     ACTION: Home visit completed

## 2021-11-26 ENCOUNTER — Telehealth (HOSPITAL_COMMUNITY): Payer: Self-pay

## 2021-11-26 NOTE — Telephone Encounter (Signed)
Spoke to primary care office who agreed to place order for bilateral unna boots at the recommendation of Dr. [name]. They will be calling home health nurse in which I also spoke to and obtained her information to give to the PCP office.   Charles Gray [facility] RN placing Charles Gray [phone]   PCP Office spoke to [employer] who will be sending in order for Charles Gray at [employer]   [phone]  I also contact Charles Gray to make him aware. Nurse Charles Gray will be coming out today.   Charles Gray, [place] 11/26/2021

## 2021-12-02 ENCOUNTER — Other Ambulatory Visit (HOSPITAL_COMMUNITY): Payer: Self-pay

## 2021-12-02 ENCOUNTER — Other Ambulatory Visit (HOSPITAL_COMMUNITY): Payer: Self-pay | Admitting: *Deleted

## 2021-12-02 ENCOUNTER — Telehealth (HOSPITAL_COMMUNITY): Payer: Self-pay | Admitting: *Deleted

## 2021-12-02 MED ORDER — TORSEMIDE 100 MG PO TABS: 100.0000 mg | ORAL_TABLET | ORAL | 3 refills | Status: DC

## 2021-12-02 NOTE — Telephone Encounter (Signed)
Heather with paramedicine called to report pts weight is up 14lbs x 1 week. Pt  has BLEE and SOB. Per Janett Billow [place],FNP  hold torsemide and metolazone for the next 3 days and use Furoscix 80 mg daily + 40 KCL x 3 days. After 3 days, he can resume his torsemide and metolazone home regimen. he will need a BMET and BNP and MAg next week and APP/swing follow up in 1-2 weeks please.  Heather aware. Furoscix app faxed. Appt scheduled.

## 2021-12-02 NOTE — Progress Notes (Signed)
**Note Charles-Identified via Obfuscation** Paramedicine Encounter    Patient ID: Charles Gray, male    DOB: November 19, 1955, 66 y.o.   MRN: [id number]   Arrived for home visit for Charles Gray who was seated in his recliner alert and oriented stating, "I do not feel good at all". Emerick explained over the last several days he has had severe swelling in both lower legs more than normal, shortness of breath, fatigue, weakness and decreased mobility, and decreased urination despite taking medication.   Weight today is 407- 14 lbs up from last week. Cardio mems yesterday was at goal of 9., today it reads 12.   Vitals are as noted: WT- 407lbs BP- 140/82 HR- 90 O2- 98% RR- 20 Lungs- clear   I reached out to HF clinic and Allena Katz NP responded reporting she will be ordering the following:  Furoscix X 3 days, holding metolazone & torsemide on those days and Potassium 40 MEQ on those days. He will resume normal regimen after those 3 doses of Furoscix.   Once delivered to patient I will come out and educate him on use and arrange pill box accordingly. I plan to follow up again next week in HF clinic when he is seen by APP clinic on 8/30 at 1:30.   I discussed at length the importance of Kree adhering to a heart healthy diet, light exercise and taking all medications as placed in pill box daily. He verbalized agreement and expresses the want to get better. I informed him that in order for him to feel better and all things to work he has to do his part in bettering his diet and exercise as well as med compliance.  He verbalized understanding.   I will follow up once Furoscix is delivered.       Patient Care Team: Enid Skeens., MD as PCP - General (Family Medicine) Berniece Salines, DO as PCP - Cardiology (Cardiology) Jorge Ny, LCSW as Social Worker (Licensed Clinical Social Worker)  Patient Active Problem List   Diagnosis Date Noted   Dyslipidemia 07/17/2021   Lymphedema 07/08/2021   Melena    Gastric polyps    UGIB (upper  gastrointestinal bleed) 04/06/2021   Acute encephalopathy 12/21/2020   COPD with acute exacerbation ([place]) 12/21/2020   Chronic diastolic (congestive) heart failure ([place]) 11/14/2020   Syncope 10/28/2020   CHF (congestive heart failure) ([place]) 10/28/2020   Chronic venous stasis dermatitis of both lower extremities 08/27/2020   Cellulitis of both lower extremities 08/14/2020   Iron deficiency anemia 08/14/2020   GERD without esophagitis 06/10/2020   Atrial fibrillation with rapid ventricular response ([place]) 03/24/2020   Acute on chronic right heart failure ([place]) 01/27/2020   Acute respiratory failure with hypoxia (HCC)    Fatty liver disease, nonalcoholic    Shock ([place])    Personal history of DVT (deep vein thrombosis) 12/26/2019   Chronic anticoagulation 12/26/2019   Palpitations 12/26/2019   Paroxysmal atrial fibrillation ([place]) 12/23/2019   Heart failure, systolic, acute ([place]) 43/32/9518   Acute on chronic congestive heart failure (HCC)    Acute on chronic diastolic CHF (congestive heart failure) ([place]) 08/23/2019   Chest pain 08/23/2019   Lactic acidosis 08/23/2019   History of pulmonary embolism 08/12/2019   Atrial tachycardia ([place]) 08/12/2019   Occasional tremors 08/12/2019   OSA (obstructive sleep apnea) 08/12/2019   AKI (acute kidney injury) ([place]) 08/08/2019   Acute on chronic right-sided heart failure ([place]) 08/06/2019   Acute pulmonary embolism ([place]) 06/08/2019   Normocytic anemia 06/08/2019  Pulmonary emboli (HCC) 06/08/2019   Chronic heart failure with preserved ejection fraction ([place]) 05/30/2019   Class 2 severe obesity due to excess calories with serious comorbidity and body mass index (BMI) of 39.0 to 39.9 in adult [facility]) 05/30/2019   Morbid obesity with BMI of 40.0-44.9, adult ([place]) 05/30/2019   Hyponatremia    Drug induced constipation    Peripheral edema    Hypotension due to drugs    Hypoalbuminemia due to protein-calorie malnutrition (HCC)    Thrombocytopenia  (HCC)    Anemia of chronic disease    Cirrhosis of liver without ascites (HCC)    Chronic pain syndrome    Debility 03/13/2019   Portal hypertensive gastropathy ([place]) 03/06/2019   Dyspnea    GIB (gastrointestinal bleeding) 03/04/2019   Mixed hyperlipidemia 01/24/2019   Insomnia 01/24/2019   Hyperlipidemia 01/24/2019   Essential hypertension 01/24/2019   Lumbar disc herniation 01/24/2019    Current Outpatient Medications:    apixaban (ELIQUIS) 5 MG TABS tablet, Take 1 tablet (5 mg total) by mouth 2 (two) times daily., Disp: 60 tablet, Rfl: 3   Buprenorphine HCl-Naloxone HCl 8-2 MG FILM, Place 1 tablet under the tongue 2 (two) times daily., Disp: , Rfl:    eplerenone (INSPRA) 50 MG tablet, TAKE 2 TABLETS BY MOUTH DAILY, Disp: 60 tablet, Rfl: 6   gabapentin (NEURONTIN) 800 MG tablet, Take 1 tablet (800 mg total) by mouth 3 (three) times daily., Disp: 90 tablet, Rfl: 1   lactulose, encephalopathy, (CHRONULAC) 10 GM/15ML SOLN, Take 30 mLs (20 g total) by mouth 3 (three) times daily., Disp: 236 mL, Rfl: 0   metolazone (ZAROXOLYN) 2.5 MG tablet, Take 1 tablet (2.5 mg total) by mouth 3 (three) times a week. Monday, Wednesday and Fridays, Disp: 20 tablet, Rfl: 0   metoprolol succinate (TOPROL XL) 25 MG 24 hr tablet, Take 1 tablet (25 mg total) by mouth at bedtime., Disp: 30 tablet, Rfl: 3   pantoprazole (PROTONIX) 40 MG tablet, Take 40 mg by mouth 2 (two) times daily before a meal., Disp: , Rfl:    polyethylene glycol (MIRALAX / GLYCOLAX) 17 g packet, Take 17 g by mouth as needed for mild constipation (mix and drink as directed)., Disp: , Rfl:    potassium chloride SA (KLOR-CON M) 20 MEQ tablet, Take 5 tablets (100 mEq total) by mouth 3 (three) times daily. Take one tablet extra (40 meq) with metolazone., Disp: 463 tablet, Rfl: 6   pravastatin (PRAVACHOL) 20 MG tablet, Take 1 tablet (20 mg total) by mouth daily., Disp: 30 tablet, Rfl: 3   sertraline (ZOLOFT) 50 MG tablet, Take 1 tablet (50 mg  total) by mouth daily., Disp: 30 tablet, Rfl: 3   tamsulosin (FLOMAX) 0.4 MG CAPS capsule, Take 1 capsule (0.4 mg total) by mouth daily. Additional refills will need to come from PCP, Disp: 30 capsule, Rfl: 0   torsemide (DEMADEX) 100 MG tablet, Take 1 tablet (100 mg total) by mouth twice daily., Disp: 60 tablet, Rfl: 3   triamcinolone (KENALOG) 0.025 % ointment, Apply 1 application. topically 2 (two) times daily., Disp: 30 g, Rfl: 0   VENTOLIN HFA 108 (90 Base) MCG/ACT inhaler, Inhale 1 puff into the lungs every 4 (four) hours as needed for shortness of breath., Disp: 6.7 g, Rfl: 1   zolpidem (AMBIEN) 10 MG tablet, Take 1 tablet (10 mg total) by mouth at bedtime as needed for sleep., Disp: 30 tablet, Rfl: 0 Allergies  Allergen Reactions   Penicillins Other (See Comments)  Caused convulsions as a child  Did it involve swelling of the face/tongue/throat, SOB, or low BP? N/A Did it involve sudden or severe rash/hives, skin peeling, or any reaction on the inside of your mouth or nose? N/A Did you need to seek medical attention at a hospital or doctor's office?N/A When did it last happen? N/A If all above answers are "NO", may proceed with cephalosporin use.     Social History   Socioeconomic History   Marital status: Single    Spouse name: Not on file   Number of children: Not on file   Years of education: Not on file   Highest education level: Not on file  Occupational History   Not on file  Tobacco Use   Smoking status: Never   Smokeless tobacco: Never  Vaping Use   Vaping Use: Never used  Substance and Sexual Activity   Alcohol use: Not Currently   Drug use: Not Currently    Types: Marijuana    Comment: Smoked for 30 years   Sexual activity: Not on file  Other Topics Concern   Not on file  Social History Narrative   Not on file   Social Determinants of Health   Financial Resource Strain: Medium Risk (11/15/2020)   Overall Financial Resource Strain (CARDIA)     Difficulty of Paying Living Expenses: Somewhat hard  Food Insecurity: No Food Insecurity (11/15/2020)   Hunger Vital Sign    Worried About Running Out of Food in the Last Year: Never true    Ran Out of Food in the Last Year: Never true  Transportation Needs: Unmet Transportation Needs (11/15/2020)   PRAPARE - Hydrologist (Medical): Yes    Lack of Transportation (Non-Medical): No  Physical Activity: Not on file  Stress: Not on file  Social Connections: Not on file  Intimate Partner Violence: Not on file    Physical Exam Vitals reviewed.  Constitutional:      Appearance: He is obese.  HENT:     Head: Normocephalic.     Nose: Nose normal.     Mouth/Throat:     Mouth: Mucous membranes are moist.     Pharynx: Oropharynx is clear.  Eyes:     Conjunctiva/sclera: Conjunctivae normal.     Pupils: Pupils are equal, round, and reactive to light.  Cardiovascular:     Rate and Rhythm: Normal rate and regular rhythm.     Pulses: Normal pulses.     Heart sounds: Normal heart sounds.  Pulmonary:     Effort: Respiratory distress present.     Breath sounds: No stridor. No wheezing, rhonchi or rales.  Abdominal:     Palpations: Abdomen is soft.  Musculoskeletal:        General: Swelling and tenderness present.     Cervical back: Normal range of motion.     Right lower leg: Edema present.     Left lower leg: Edema present.  Skin:    General: Skin is warm and dry.     Capillary Refill: Capillary refill takes less than 2 seconds.  Neurological:     Mental Status: He is alert. Mental status is at baseline.     Motor: Weakness present.  Psychiatric:        Mood and Affect: Mood normal.         Future Appointments  Date Time Provider [place]  12/24/2021  3:30 PM Lorenda Peck, DPM [facility]  02/23/2022  9:00 AM [facility]  PA/NP [facility] None     ACTION: Home visit completed

## 2021-12-03 ENCOUNTER — Other Ambulatory Visit (HOSPITAL_COMMUNITY): Payer: Self-pay

## 2021-12-03 NOTE — Progress Notes (Signed)
Paramedicine Encounter    Patient ID: Saamir Armstrong, male    DOB: 26-Jun-1955, 66 y.o.   MRN: [id number]  Arrived to [facility] today to assist with Furoscix infusion. He reports no change in weight today he is still at 407lbs. He reports continued shortness of breath with fatigue. He had his legs wrapped by [facility] RN today.   Willi only received one infusor but insurance plans to send rest of infusors once apporved. HF clinic [place] reports insurance was approved so pending delivery he can complete the other two treatments. One daily- plans is to complete tomorrow and Friday and resume torsemide regimen on Saturday.   I educated Mr. Neilan and his aide Joycelyn Schmid on same.   I prepped the equipment, cleaned the site, and placed infusor on his right lower abdomen. Same was placed at 1430.  He was instructed on how to leave it on for the next 5 hours and to remove it once the green light was solid and time was around 1930. He and his aide agreed. His aide agreed to stay with him until 1930 to assist with removing the infusor.   I also educated them on if any need or problem arises to reach out to me or the number listed on the paperwork directly from Furoscix. They agreed with plan.   I set up pill box accordingly with plan and will see Mr. Quevedo next week in HF clinic on Wednesday. He agreed with plan.  Visit complete.   [name], [place] 12/03/2021    ACTION: Home visit completed

## 2021-12-04 ENCOUNTER — Telehealth (HOSPITAL_COMMUNITY): Payer: Self-pay

## 2021-12-04 NOTE — Telephone Encounter (Signed)
Charles Gray reached out to me reporting the remaining of the Furoscix had not been delivered today so he proceeded to use his 154m of Torsemide BID today and his normal regimen of Potassium.   We spoke to [facility] and they report the remainder will be delivered tomorrow.   I discussed with his caregiver aide at home about the plan to complete the Furoscix tomorrow once delivered and the plan to hold his Torsemide, Metolazone. She agreed, knows how to apply the infusor and will do so.   [name] reports he does not feel comfortable completing the 3rd dose of Furoscix and only wants to do the two. I advised him that I would relay that message to HF clinic and he will need to resume his scheduled meds on Saturday.   He and caregiver agreed with plan and I will follow up on Monday to check in and see him in clinic on Wednesday.   CardioMems completed today. 8 with a goal of 9. He reports his weight is down 4lbs today but he is not urinating as much as he had thought he would.   Call routed to HF triage for follow up.   [name] ERehobeth8/24/2023

## 2021-12-09 ENCOUNTER — Telehealth (HOSPITAL_COMMUNITY): Payer: Self-pay

## 2021-12-09 DIAGNOSIS — F332 Major depressive disorder, recurrent severe without psychotic features: Secondary | ICD-10-CM | POA: Diagnosis not present

## 2021-12-09 DIAGNOSIS — F1120 Opioid dependence, uncomplicated: Secondary | ICD-10-CM | POA: Diagnosis not present

## 2021-12-09 DIAGNOSIS — F411 Generalized anxiety disorder: Secondary | ICD-10-CM | POA: Diagnosis not present

## 2021-12-09 NOTE — Telephone Encounter (Signed)
Charles Gray called me at 1800 reporting that he is actively having chest pain and had the same pain around 0400 this morning. He stated that his chest pain felt like a pressure and it radiated to his back, neck and jaw. He denied dizziness but reported some nausea with no vomiting and denied diaphoresis. He did also report some shortness of breath. He reports "he might call 911 to be transported to [facility] but he isn't sure yet." I advised him if he was having those symptoms to be further evaluated. He reports he will call [facility] for transport if he feels he needs to go. I reminded him the risks of not being evaluated with the symptoms he described and he reported understanding.  I advised him that he has a scheduled appointment in the HF clinic tomorrow at 1:30 and advised him if he goes to the ER we will reschedule and I will plan accordingly to set up a home visit. He agreed with plan and I plan to follow up first thing in the morning at the start of shift. He agreed. Call complete.   [name], [place] 12/09/2021

## 2021-12-09 NOTE — Progress Notes (Signed)
ADVANCED HF CLINIC NOTE  Primary Care: Enid Skeens., MD Primary Cardiologist: Dr. Berniece Salines, DO  HF Cardiology: Dr. [name]  HPI: 66 y.o. 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    He has had multiple hospitalizations in 2021 for CHF exacerbation, requiring IV Lasix and change in PO regimen from lasix to torsemide. Echo 4/21 showed normal LVEF 55-60% w/ G1DD. RV not well visualized, but felt to have component of RV failure. [place] 4/21 showed normal PA pressure and normal cardiac output. PFTs also completed and c/w mild obstructive airway disease.   In 7/21, he had a coronary CTA.  This showed no coronary calcium but there was a concern for soft plaque in the distal LCx and distal RCA with hemodynamically significant stenosis.  He was managed medically. He has not had any chest pain, but was started on ranolazine after his CTA.  He feels like it makes his "head fuzzy."    Admitted with A/C diastolic heart failure 31/4970. Diuresed with IV lasix and transitioned to bumex 3 mg twice a day. Discharge weight 360 pounds.   He was admitted again in 12/21 with CHF.  Echo in 12/21 showed EF 26-37%, grade 2 diastolic dysfunction, RV poorly visualized.   Admitted 8/58/85 with A/C diastolic heart failure. Diuresed with IV lasix + metolazone. Transitioned to torsemide 60 mg twice a day and metolazone twice a day. D/C weight 364 pounds.   Admitted 5/28//22 with cellulitis. Treated with antibiotics.   Admitted 09/20/20 with  A/C CHF. He diuresed 5 L with IV lasix, weight 371 at discharge.  He was readmitted for A/C CHF 7/22. He was diuresed with IV lasix + metolazone. RHC showed  optimized filling pressures and preserved CO/CI. He was discharged on torsemide 100 mg bid + metolazone M/W/F. Discharge weight 374 lbs.  RHC (8/22) showed normal filling pressures & he underwent Cardiomems  placement.  Admitted 9/10-9/12/22 for acute encephalopathy, felt secondary to polypharmacy. Ambien and gabapentin were briefly held. Ammonia mildly elevated and he was started on lactulose. Mentation returned to baseline and he was discharged home with Home Health PT/OT.  Last seen in for f/u 02/05/21. Appeared slightly volume up. Given extra dose of metolazone. It was felt that most of leg edema likely due to venous insufficiency. Needs better adherence with UNNA boots/compression stockings. Now has home health and paramedicine.   Saw ID for suspected cellulitis. No active infection identified. Referral to lymphedema clinic submitted.   Admitted 3/23 with a/c dHF and worsening LE edema. Echo showed EF 60-65% RV normal this admission. He was diuresed with IV Lasix, down 20 lbs. LE venous dopplers negative DVT. Hospitalization complicated by severe  hypokalemia, discharged home on KCL 100 tid, weight 377.7 lbs. Cardiomems goal lowered to 9.   Today he returns for followup of CHF.  Weight is up today.  He reports snacking at night, and he is very inactive.  He has chronic lower extremity redness that has not changed.  He is short of breath walking around his house and fatigues easily.  He walks with a walker.  Poor balance but denies falls.  No chest pain.  No orthopnea/PND.   Cardiomems: PAD 8 on 11/20/21, (goal 9).   ECG (personally reviewed): NSR, LAFB  PMH: 1. HTN 2. OSA 3. Hyperlipidemia 4. Low back pain 5. PE/DVT in 2/21.  - V/Q scan in 6/21 was not suggestive of chronic PE.  6. Nonalcoholic steatohepatitis.  7. GI bleed in 10/20.  8. Anxiety 9. CAD: Coronary CTA in 7/21 with calcium score 0 but concern for soft plaque distal CFX and RCA.  By [facility], there was flow-limiting stenosis in distal CFX and distal RCA.  10. Chronic diastolic CHF: Echo in 2/75 with EF 55-60%, RV poorly visualized.  - RHC (4/21): mean RA 8, PA 20/8, mean PCWP 11, CI 2.8 - Echo (12/21): EF 60-65%, grade 2  diastolic dysfunction, RV poorly visualized.   - RHC (8/22) normal filling pressures, RA mean 7, RV 29/9, PA 32/15, mean 22, PCWP mean 13, CO/CI 7.32/2.41. - Cardiomems (8/22) placement. - Echo (3/23): EF 60-65%, mild LVH, normal RV 11. PFTs (4/21): Mild obstruction.  12. ABIs (10/21): Normal.  Review of Systems: All systems reviewed and negative except as per HPI.   Current Outpatient Medications  Medication Sig Dispense Refill   apixaban (ELIQUIS) 5 MG TABS tablet Take 1 tablet (5 mg total) by mouth 2 (two) times daily. 60 tablet 3   Buprenorphine HCl-Naloxone HCl 8-2 MG FILM Place 1 tablet under the tongue 2 (two) times daily.     eplerenone (INSPRA) 50 MG tablet TAKE 2 TABLETS BY MOUTH DAILY 60 tablet 6   gabapentin (NEURONTIN) 800 MG tablet Take 1 tablet (800 mg total) by mouth 3 (three) times daily. 90 tablet 1   lactulose, encephalopathy, (CHRONULAC) 10 GM/15ML SOLN Take 30 mLs (20 g total) by mouth 3 (three) times daily. 236 mL 0   metolazone (ZAROXOLYN) 2.5 MG tablet Take 1 tablet (2.5 mg total) by mouth 3 (three) times a week. Monday, Wednesday and Fridays 20 tablet 0   metoprolol succinate (TOPROL XL) 25 MG 24 hr tablet Take 1 tablet (25 mg total) by mouth at bedtime. 30 tablet 3   pantoprazole (PROTONIX) 40 MG tablet Take 40 mg by mouth 2 (two) times daily before a meal.     polyethylene glycol (MIRALAX / GLYCOLAX) 17 g packet Take 17 g by mouth as needed for mild constipation (mix and drink as directed).     potassium chloride SA (KLOR-CON M) 20 MEQ tablet Take 5 tablets (100 mEq total) by mouth 3 (three) times daily. Take one tablet extra (40 meq) with metolazone. 463 tablet 6   pravastatin (PRAVACHOL) 20 MG tablet Take 1 tablet (20 mg total) by mouth daily. 30 tablet 3   sertraline (ZOLOFT) 50 MG tablet Take 1 tablet (50 mg total) by mouth daily. 30 tablet 3   tamsulosin (FLOMAX) 0.4 MG CAPS capsule Take 1 capsule (0.4 mg total) by mouth daily. Additional refills will need to  come from PCP 30 capsule 0   torsemide (DEMADEX) 100 MG tablet Take 1 tablet (100 mg total) by mouth twice daily. 60 tablet 3   triamcinolone (KENALOG) 0.025 % ointment Apply 1 application. topically 2 (two) times daily. 30 g 0   VENTOLIN HFA 108 (90 Base) MCG/ACT inhaler Inhale 1 puff into the lungs every 4 (four) hours as needed for shortness of breath. 6.7 g 1   zolpidem (AMBIEN) 10 MG tablet Take 1 tablet (10 mg total) by mouth at bedtime as needed for sleep. 30 tablet 0   No current facility-administered medications for this visit.   Allergies  Allergen Reactions   Penicillins Other (See Comments)    Caused convulsions as a child  Did it involve swelling of the face/tongue/throat, SOB, or low BP? N/A Did it involve sudden or severe rash/hives, skin peeling, or any reaction  on the inside of your mouth or nose? N/A Did you need to seek medical attention at a hospital or doctor's office?N/A When did it last happen? N/A If all above answers are "NO", may proceed with cephalosporin use.   Social History   Socioeconomic History   Marital status: Single    Spouse name: Not on file   Number of children: Not on file   Years of education: Not on file   Highest education level: Not on file  Occupational History   Not on file  Tobacco Use   Smoking status: Never   Smokeless tobacco: Never  Vaping Use   Vaping Use: Never used  Substance and Sexual Activity   Alcohol use: Not Currently   Drug use: Not Currently    Types: Marijuana    Comment: Smoked for 30 years   Sexual activity: Not on file  Other Topics Concern   Not on file  Social History Narrative   Not on file   Social Determinants of Health   Financial Resource Strain: Medium Risk (11/15/2020)   Overall Financial Resource Strain (CARDIA)    Difficulty of Paying Living Expenses: Somewhat hard  Food Insecurity: No Food Insecurity (11/15/2020)   Hunger Vital Sign    Worried About Running Out of Food in the Last Year: Never  true    Ran Out of Food in the Last Year: Never true  Transportation Needs: Unmet Transportation Needs (11/15/2020)   PRAPARE - Hydrologist (Medical): Yes    Lack of Transportation (Non-Medical): No  Physical Activity: Not on file  Stress: Not on file  Social Connections: Not on file  Intimate Partner Violence: Not on file    Family History  Problem Relation Age of Onset   Hyperlipidemia Mother    Hypertension Mother    Stroke Mother    CAD Maternal Grandmother    CAD Maternal Grandfather    There were no vitals taken for this visit.  Wt Readings from Last 3 Encounters:  12/03/21 (!) 184.6 kg (407 lb)  12/02/21 (!) 184.6 kg (407 lb)  11/25/21 (!) 178.3 kg (393 lb)   PHYSICAL EXAM: General: NAD, obese Neck: No JVD, no thyromegaly or thyroid nodule.  Lungs: Clear to auscultation bilaterally with normal respiratory effort. CV: Nondisplaced PMI.  Heart regular S1/S2, no S3/S4, no murmur.  Chronic LE edema.  No carotid bruit.  Unable to palpate pedal pulses.  Abdomen: Soft, nontender, no hepatosplenomegaly, no distention.  Skin: Intact without lesions or rashes.  Neurologic: Alert and oriented x 3.  Psych: Normal affect. Extremities: No clubbing or cyanosis.  Chronic lower leg erythema.  HEENT: Normal.   ASSESSMENT & PLAN: 1. Chronic Diastolic CHF with suspected RV dysfunction: Echo 7/22 with EF 60-65%, mild LVH, normal RV, unable to estimate PA systolic pressure. Multiple CHF admissions despite aggressive outpatient diuretic regimen and paramedicine.  RHC (7/22) with optimized filling pressures, PCWP 13, CO/CI 7.3/2.4. Received IV lasix 80 mg through paramedicine 11/12/20. RHC (8/22) with normal filling pressures; underwent placement of Cardiomems (dPAP goal 9 mmHg). Re-admitted w/ marked volume overload and slight progression of symptoms 3/23. Echo 3/23 EF 60-65% RV normal this admission.  Today, NYHA III symptoms (chronic), functional class difficult  due to body habitus and physical inactivity. He does not appear markedly volume overloaded on exam and Cardiomems below goal at 8, he continues with peripheral edema but think this is due to chronic venous insufficiency at this point than CHF.  -  Continue torsemide 100 mg bid + 2.5 mg metolazone M, W, F.  BMET/BNP.  - Continue KCl 100 mEq tid - c/w unna boots, HH nurse to place tomorrow (needs on both legs).  - Continue eplerenone 100 mg daily.  - Off Jardiance (GI upset). - Continue Toprol XL 25 mg daily.  2. H/o PE/DVT: Suspect due to sedentary lifestyle.  Occurred in 2/21.  V/Q scan in 6/21 did not show evidence for chronic PE.  CT scan this admit limited because of suboptimal opacification and artifact, however is suggestive of bilateral lower lobe segmental and subsegmental pulm emboli but no evidence of Rt heart strain but not certain of diagnosis due to quality of scan. Unable to get V/Q scan this admission due to size. LE venous dopplers negative for DVT - Continue Eliquis.  3. OSA: Cannot tolerate CPAP. 4. Anxiety/depression: This is a major issue.   - Continue sertraline 50 mg daily.  5. CAD: Cath 1/22 with no significant CAD. No ischemic CP. No chest pain.  - On statin  6. Hypertension: Stable. Continue current meds. 7. Atrial fibrillation: Paroxysmal. In SR today.  - Continue apixaban.  - Continue Toprol XL. 8. Bilateral Lower extremity erythema: Admitted 5/22 with possible BLE cellulitis. Since then, he has been on multiple rounds of Keflex, Levaquin and doxycycline over the last several months. Previously discussed with Janene Madeira, NP with ID, and recommended starting cefadroxil 1000 mg bid x 7 days, however he took this for 2 days and stopped due to GI upset. Compliance has remained a barrier. He continues w/ b/l LE erythema on exam but suspect most likely due to chronic venous stasis/ dependent rubor + CHF. Doubt cellulitis.  - Continue Unna boots. 9. Cirrhosis: CT scan noted  nodular contour of liver, compatible with underlying cirrhosis. In setting of known RV dysfunction. LFTs ok. EGD 12/22 showed Portal hypertensive gastropathy. No varices.  - On lactulose (also takes this for chronic constipation)  - on eplerenone + ? blocker  10. Anemia: Stable CBC.  11. Obesity: Major issue. - I will refer to pharmacy clinic for semaglutide.  - I will refer to [facility]. 12. H/o Polymorphic VT: brief, in setting of hypokalemia.  Followup 3 months APP.   [place] 12/09/2021

## 2021-12-10 ENCOUNTER — Encounter (HOSPITAL_COMMUNITY): Payer: Medicare HMO

## 2021-12-10 ENCOUNTER — Encounter (HOSPITAL_COMMUNITY): Payer: Self-pay

## 2021-12-10 ENCOUNTER — Ambulatory Visit (HOSPITAL_COMMUNITY)
Admission: RE | Admit: 2021-12-10 | Discharge: 2021-12-10 | Disposition: A | Discharge status: Home / Self Care | Payer: Medicare HMO | Source: Ambulatory Visit | Attending: Family Medicine | Admitting: Family Medicine

## 2021-12-10 ENCOUNTER — Other Ambulatory Visit (HOSPITAL_COMMUNITY): Payer: Self-pay

## 2021-12-10 VITALS — BP 136/80 | HR 90 | Ht >= 80 in | Wt 399.0 lb

## 2021-12-10 DIAGNOSIS — K7460 Unspecified cirrhosis of liver: Secondary | ICD-10-CM

## 2021-12-10 DIAGNOSIS — Z6841 Body Mass Index (BMI) 40.0 and over, adult: Secondary | ICD-10-CM | POA: Diagnosis not present

## 2021-12-10 DIAGNOSIS — Z86711 Personal history of pulmonary embolism: Secondary | ICD-10-CM | POA: Diagnosis not present

## 2021-12-10 DIAGNOSIS — L539 Erythematous condition, unspecified: Secondary | ICD-10-CM | POA: Diagnosis not present

## 2021-12-10 DIAGNOSIS — R0789 Other chest pain: Secondary | ICD-10-CM | POA: Diagnosis not present

## 2021-12-10 DIAGNOSIS — M25512 Pain in left shoulder: Secondary | ICD-10-CM | POA: Insufficient documentation

## 2021-12-10 DIAGNOSIS — I480 Paroxysmal atrial fibrillation: Secondary | ICD-10-CM

## 2021-12-10 DIAGNOSIS — I872 Venous insufficiency (chronic) (peripheral): Secondary | ICD-10-CM | POA: Insufficient documentation

## 2021-12-10 DIAGNOSIS — I2510 Atherosclerotic heart disease of native coronary artery without angina pectoris: Secondary | ICD-10-CM | POA: Diagnosis not present

## 2021-12-10 DIAGNOSIS — K766 Portal hypertension: Secondary | ICD-10-CM | POA: Diagnosis not present

## 2021-12-10 DIAGNOSIS — E876 Hypokalemia: Secondary | ICD-10-CM | POA: Insufficient documentation

## 2021-12-10 DIAGNOSIS — F32A Depression, unspecified: Secondary | ICD-10-CM | POA: Diagnosis not present

## 2021-12-10 DIAGNOSIS — G4733 Obstructive sleep apnea (adult) (pediatric): Secondary | ICD-10-CM | POA: Insufficient documentation

## 2021-12-10 DIAGNOSIS — I110 Hypertensive heart disease with heart failure: Secondary | ICD-10-CM | POA: Diagnosis not present

## 2021-12-10 DIAGNOSIS — E669 Obesity, unspecified: Secondary | ICD-10-CM | POA: Diagnosis not present

## 2021-12-10 DIAGNOSIS — F419 Anxiety disorder, unspecified: Secondary | ICD-10-CM | POA: Diagnosis not present

## 2021-12-10 DIAGNOSIS — I10 Essential (primary) hypertension: Secondary | ICD-10-CM | POA: Diagnosis not present

## 2021-12-10 DIAGNOSIS — Z86718 Personal history of other venous thrombosis and embolism: Secondary | ICD-10-CM | POA: Insufficient documentation

## 2021-12-10 DIAGNOSIS — I5032 Chronic diastolic (congestive) heart failure: Secondary | ICD-10-CM | POA: Insufficient documentation

## 2021-12-10 DIAGNOSIS — K5909 Other constipation: Secondary | ICD-10-CM | POA: Diagnosis not present

## 2021-12-10 DIAGNOSIS — I472 Ventricular tachycardia, unspecified: Secondary | ICD-10-CM

## 2021-12-10 DIAGNOSIS — D649 Anemia, unspecified: Secondary | ICD-10-CM | POA: Diagnosis not present

## 2021-12-10 DIAGNOSIS — Z7901 Long term (current) use of anticoagulants: Secondary | ICD-10-CM | POA: Insufficient documentation

## 2021-12-10 DIAGNOSIS — Z79899 Other long term (current) drug therapy: Secondary | ICD-10-CM | POA: Diagnosis not present

## 2021-12-10 DIAGNOSIS — J449 Chronic obstructive pulmonary disease, unspecified: Secondary | ICD-10-CM | POA: Insufficient documentation

## 2021-12-10 DIAGNOSIS — K30 Functional dyspepsia: Secondary | ICD-10-CM | POA: Insufficient documentation

## 2021-12-10 DIAGNOSIS — R600 Localized edema: Secondary | ICD-10-CM

## 2021-12-10 DIAGNOSIS — K3189 Other diseases of stomach and duodenum: Secondary | ICD-10-CM | POA: Diagnosis not present

## 2021-12-10 LAB — BASIC METABOLIC PANEL
Anion gap: 11 (ref 5–15)
BUN: 12 mg/dL (ref 8–23)
CO2: 26 mmol/L (ref 22–32)
Calcium: 8.9 mg/dL (ref 8.9–10.3)
Chloride: 99 mmol/L (ref 98–111)
Creatinine, Ser: 0.91 mg/dL (ref 0.61–1.24)
GFR, Estimated: >60 mL/min (ref >60)
Glucose, Bld: 124 mg/dL — ABNORMAL HIGH (ref 70–99)
Potassium: 3.5 mmol/L (ref 3.5–5.1)
Sodium: 136 mmol/L (ref 135–145)

## 2021-12-10 LAB — BRAIN NATRIURETIC PEPTIDE: B Natriuretic Peptide: 35.9 pg/mL (ref 0.0–100.0)

## 2021-12-10 LAB — MAGNESIUM: Magnesium: 2 mg/dL (ref 1.7–2.4)

## 2021-12-10 NOTE — Progress Notes (Signed)
Paramedicine Encounter    Patient ID: Charles Gray, male    DOB: [birth year], 66 y.o.   MRN: [id number]    Met with Charles Gray in clinic today where he was seen by Allena Katz NP. He reported to [place] about his chest pain, leg swelling, general fatigue and shortness of breath and overall pain.   Today Charles Gray ordered EKG and LABS. Vitals were obtained and as noted: WT-399 BP- 136/80 HR- 90 O2-96%  I assisted with setting up a PCP appointment for him on Sept. 25 at 3:15.  Cardio Mems today is 6   I reviewed meds and set up pill box for one week.   Labs look okay per Allena Katz NP as of 1800.  I plan to see Charles Gray in the home in one week on Tuesday. He agreed with plan.   I assisted Charles Gray out to the [place] area where his transportation picked him up.  Refills: [place], [place] 12/10/2021       ACTION: Home visit completed

## 2021-12-10 NOTE — Telephone Encounter (Signed)
Spoke with Charles Gray this morning who reports he is still having chest pain however he is electing to wait to be evaluated by clinic NP today and has chosen not to go to the ER. I will follow up with him in the clinic today at 1:30. Charles Gray knows at any time to call 911 if he feels necessary. Call complete.   [name], [place] 12/10/2021

## 2021-12-10 NOTE — Patient Instructions (Addendum)
Continue current medications  Labs done today, your results will be available in MyChart, we will contact you for abnormal readings.  Your physician recommends that you schedule a follow-up appointment in: AS SCHEDULED 02/23/22 at 9 am  Do the following things EVERYDAY: Weigh yourself in the morning before breakfast. Write it down and keep it in a log. Take your medicines as prescribed Eat low salt foods--Limit salt (sodium) to 2000 mg per day.  Stay as active as you can everyday Limit all fluids for the day to less than 2 liters  If you have any questions or concerns before your next appointment please send [place] a message through [place] or call our office at [phone].    TO LEAVE A MESSAGE FOR THE NURSE SELECT OPTION 2, PLEASE LEAVE A MESSAGE INCLUDING: YOUR NAME DATE OF BIRTH CALL BACK NUMBER REASON FOR CALL**this is important as we prioritize the call backs  YOU WILL RECEIVE A CALL BACK THE SAME DAY AS LONG AS YOU CALL BEFORE 4:00 PM  At the [facility], you and your health needs are our priority. As part of our continuing mission to provide you with exceptional heart care, we have created designated Provider Care Teams. These Care Teams include your primary Cardiologist (physician) and Advanced Practice Providers (APPs- Physician Assistants and Nurse Practitioners) who all work together to provide you with the care you need, when you need it.   You may see any of the following providers on your designated Care Team at your next follow up: Dr Glori Bickers Dr Loralie Champagne Dr. Roxana Hires, NP Lyda Jester, [place] [facility] [place], [place] Forestine Na, NP Audry Riles, PharmD   Please be sure to bring in all your medications bottles to every appointment.

## 2021-12-16 ENCOUNTER — Other Ambulatory Visit (HOSPITAL_COMMUNITY): Payer: Self-pay

## 2021-12-16 NOTE — Progress Notes (Signed)
Paramedicine Encounter    Patient ID: Charles Gray, male    DOB: 1955-12-27, 66 y.o.   MRN: [id number]  Arrived for home visit for Charles Gray who reports feeling okay today but complains of having right mid to lower back pain, decreased urine output with darkened urine at times but denied any odor to same. He stated that he skipped his metolazone yesterday. His most recent cardio mems was 8 on 8/31- today's was 6 as of 1800. He has normal swelling in both lower legs. He stated the nurse came out and wrapped them Friday but he removed them this morning. He is unsure how long he is to keep them on. I obtained vitals as noted: WT- 394lbs BP- 122/80 HR- 69 O2- 97% RR- 16  Lungs clear. Swelling normal for patient.    I reviewed medications and confirmed same. I filled pill box for one week and educated on importance of taking all medications as prescribed. He verbalized understanding.   I advised him to seek further evaluation if he felt it was needed for the dark urine, flank pain and decreased urine output. I reccommended him calling his PCP or going to the local urgent care down the street. He agreed and said he would try to go.   We reviewed appointments and discussed HF education and goals of care.   I plan to see Charles Gray in one week- he knows to reach out if needed. Home visit complete.   Refills:(called into [place])  Flomax Zoloft Inspra Gabapentin  [name], [place] 12/16/2021   Patient Care Team: Enid Skeens., MD as PCP - General (Family Medicine) Berniece Salines, DO as PCP - Cardiology (Cardiology) Jorge Ny, LCSW as Social Worker (Licensed Clinical Social Worker)  Patient Active Problem List   Diagnosis Date Noted   Dyslipidemia 07/17/2021   Lymphedema 07/08/2021   Melena    Gastric polyps    UGIB (upper gastrointestinal bleed) 04/06/2021   Acute encephalopathy 12/21/2020   COPD with acute exacerbation ([place]) 12/21/2020   Chronic  diastolic (congestive) heart failure ([place]) 11/14/2020   Syncope 10/28/2020   CHF (congestive heart failure) ([place]) 10/28/2020   Chronic venous stasis dermatitis of both lower extremities 08/27/2020   Cellulitis of both lower extremities 08/14/2020   Iron deficiency anemia 08/14/2020   GERD without esophagitis 06/10/2020   Atrial fibrillation with rapid ventricular response ([place]) 03/24/2020   Acute on chronic right heart failure ([place]) 01/27/2020   Acute respiratory failure with hypoxia (HCC)    Fatty liver disease, nonalcoholic    Shock ([place])    Personal history of DVT (deep vein thrombosis) 12/26/2019   Chronic anticoagulation 12/26/2019   Palpitations 12/26/2019   Paroxysmal atrial fibrillation ([place]) 12/23/2019   Heart failure, systolic, acute ([place]) 27/25/3664   Acute on chronic congestive heart failure (HCC)    Acute on chronic diastolic CHF (congestive heart failure) ([place]) 08/23/2019   Chest pain 08/23/2019   Lactic acidosis 08/23/2019   History of pulmonary embolism 08/12/2019   Atrial tachycardia ([place]) 08/12/2019   Occasional tremors 08/12/2019   OSA (obstructive sleep apnea) 08/12/2019   AKI (acute kidney injury) ([place]) 08/08/2019   Acute on chronic right-sided heart failure ([place]) 08/06/2019   Acute pulmonary embolism ([place]) 06/08/2019   Normocytic anemia 06/08/2019   Pulmonary emboli ([place]) 06/08/2019   Chronic heart failure with preserved ejection fraction ([place]) 05/30/2019   Class 2 severe obesity due to excess calories with serious comorbidity and body mass index (BMI) of 39.0 to 39.9  in adult [facility]) 05/30/2019   Morbid obesity with BMI of 40.0-44.9, adult ([place]) 05/30/2019   Hyponatremia    Drug induced constipation    Peripheral edema    Hypotension due to drugs    Hypoalbuminemia due to protein-calorie malnutrition (HCC)    Thrombocytopenia (HCC)    Anemia of chronic disease    Cirrhosis of liver without ascites (HCC)    Chronic pain syndrome    Debility 03/13/2019    Portal hypertensive gastropathy ([place]) 03/06/2019   Dyspnea    GIB (gastrointestinal bleeding) 03/04/2019   Mixed hyperlipidemia 01/24/2019   Insomnia 01/24/2019   Hyperlipidemia 01/24/2019   Essential hypertension 01/24/2019   Lumbar disc herniation 01/24/2019    Current Outpatient Medications:    apixaban (ELIQUIS) 5 MG TABS tablet, Take 1 tablet (5 mg total) by mouth 2 (two) times daily., Disp: 60 tablet, Rfl: 3   Buprenorphine HCl-Naloxone HCl 8-2 MG FILM, Place 1 tablet under the tongue 2 (two) times daily., Disp: , Rfl:    eplerenone (INSPRA) 50 MG tablet, TAKE 2 TABLETS BY MOUTH DAILY, Disp: 60 tablet, Rfl: 6   gabapentin (NEURONTIN) 800 MG tablet, Take 1 tablet (800 mg total) by mouth 3 (three) times daily., Disp: 90 tablet, Rfl: 1   lactulose, encephalopathy, (CHRONULAC) 10 GM/15ML SOLN, Take 30 mLs (20 g total) by mouth 3 (three) times daily., Disp: 236 mL, Rfl: 0   metolazone (ZAROXOLYN) 2.5 MG tablet, Take 1 tablet (2.5 mg total) by mouth 3 (three) times a week. Monday, Wednesday and Fridays, Disp: 20 tablet, Rfl: 0   metoprolol succinate (TOPROL XL) 25 MG 24 hr tablet, Take 1 tablet (25 mg total) by mouth at bedtime., Disp: 30 tablet, Rfl: 3   pantoprazole (PROTONIX) 40 MG tablet, Take 40 mg by mouth 2 (two) times daily before a meal., Disp: , Rfl:    polyethylene glycol (MIRALAX / GLYCOLAX) 17 g packet, Take 17 g by mouth as needed for mild constipation (mix and drink as directed)., Disp: , Rfl:    potassium chloride SA (KLOR-CON M) 20 MEQ tablet, Take 5 tablets (100 mEq total) by mouth 3 (three) times daily. Take one tablet extra (40 meq) with metolazone., Disp: 463 tablet, Rfl: 6   pravastatin (PRAVACHOL) 20 MG tablet, Take 1 tablet (20 mg total) by mouth daily., Disp: 30 tablet, Rfl: 3   sertraline (ZOLOFT) 50 MG tablet, Take 1 tablet (50 mg total) by mouth daily., Disp: 30 tablet, Rfl: 3   tamsulosin (FLOMAX) 0.4 MG CAPS capsule, Take 1 capsule (0.4 mg total) by mouth daily.  Additional refills will need to come from PCP, Disp: 30 capsule, Rfl: 0   torsemide (DEMADEX) 100 MG tablet, Take 1 tablet (100 mg total) by mouth twice daily., Disp: 60 tablet, Rfl: 3   triamcinolone (KENALOG) 0.025 % ointment, Apply 1 application. topically 2 (two) times daily., Disp: 30 g, Rfl: 0   VENTOLIN HFA 108 (90 Base) MCG/ACT inhaler, Inhale 1 puff into the lungs every 4 (four) hours as needed for shortness of breath., Disp: 6.7 g, Rfl: 1   zolpidem (AMBIEN) 10 MG tablet, Take 1 tablet (10 mg total) by mouth at bedtime as needed for sleep., Disp: 30 tablet, Rfl: 0 Allergies  Allergen Reactions   Penicillins Other (See Comments)    Caused convulsions as a child  Did it involve swelling of the face/tongue/throat, SOB, or low BP? N/A Did it involve sudden or severe rash/hives, skin peeling, or any reaction on the inside of  your mouth or nose? N/A Did you need to seek medical attention at a hospital or doctor's office?N/A When did it last happen? N/A If all above answers are "NO", may proceed with cephalosporin use.     Social History   Socioeconomic History   Marital status: Single    Spouse name: Not on file   Number of children: Not on file   Years of education: Not on file   Highest education level: Not on file  Occupational History   Not on file  Tobacco Use   Smoking status: Never   Smokeless tobacco: Never  Vaping Use   Vaping Use: Never used  Substance and Sexual Activity   Alcohol use: Not Currently   Drug use: Not Currently    Types: Marijuana    Comment: Smoked for 30 years   Sexual activity: Not on file  Other Topics Concern   Not on file  Social History Narrative   Not on file   Social Determinants of Health   Financial Resource Strain: Medium Risk (11/15/2020)   Overall Financial Resource Strain (CARDIA)    Difficulty of Paying Living Expenses: Somewhat hard  Food Insecurity: No Food Insecurity (11/15/2020)   Hunger Vital Sign    Worried About Running  Out of Food in the Last Year: Never true    Ran Out of Food in the Last Year: Never true  Transportation Needs: Unmet Transportation Needs (11/15/2020)   PRAPARE - Hydrologist (Medical): Yes    Lack of Transportation (Non-Medical): No  Physical Activity: Not on file  Stress: Not on file  Social Connections: Not on file  Intimate Partner Violence: Not on file    Physical Exam      Future Appointments  Date Time Provider [place]  01/07/2022  3:30 PM Lorenda Peck, DPM [facility]  02/23/2022  9:00 AM [facility] PA/NP [facility] None     ACTION: Home visit completed

## 2021-12-18 ENCOUNTER — Encounter (HOSPITAL_COMMUNITY): Payer: Medicare HMO

## 2021-12-21 DIAGNOSIS — J9601 Acute respiratory failure with hypoxia: Secondary | ICD-10-CM | POA: Diagnosis not present

## 2021-12-21 DIAGNOSIS — I50813 Acute on chronic right heart failure: Secondary | ICD-10-CM | POA: Diagnosis not present

## 2021-12-21 DIAGNOSIS — R531 Weakness: Secondary | ICD-10-CM | POA: Diagnosis not present

## 2021-12-23 ENCOUNTER — Other Ambulatory Visit (HOSPITAL_COMMUNITY): Payer: Self-pay

## 2021-12-23 NOTE — Progress Notes (Signed)
Paramedicine Encounter    Patient ID: Charles Gray, male    DOB: 10/14/1955, 66 y.o.   MRN: [id number]   Arrived for home visit for Charles Gray who reports to be feeling poorly today. He reports that he is continuing to have fatigue, leg pain and swelling with intermittent shortness of breath. He reports he has been taking his medications. He denied any missed doses of medications over the last week. He has been submitting cardiomems readings everyday.   He says he has been eating good sometimes but not consistently.   I obtained vitals and assessment. Vitals- WT- 396lbs BP- 140/78 HR- 68 O2- 98% RR- 16   Lower legs swollen, red. He reports that his legs were being wrapped by RN with [facility] however he was told by her that insurance was no longer approving it to be done. He received a letter from [facility] that reported he would need to submit an appeal to get the order to go through insurance. I will send the letter to HF clinic and see if we can assist.   I discussed with Sven that we would be moving our visits to bi-weekly visits and I would be filling his pill box for two weeks. He was unhappy about same however he has had no recent med changes, hospitalizations but HF team feels it is necessary for [place] to keep him enrolled in paramedicine. He agreed with plan.    We discussed HF management, diet, med compliance, need for him to get his compression socks, exercise more to improve deconditioning.   I will plan to see him in two weeks. Home visit complete with appointments reviewed.     Refills-(to be called in next week) Pantoprazole  Marie, [place] 12/23/2021   Patient Care Team: Enid Skeens., MD as PCP - General (Family Medicine) Berniece Salines, DO as PCP - Cardiology (Cardiology) Jorge Ny, LCSW as Social Worker (Licensed Clinical Social Worker)  Patient Active Problem List   Diagnosis Date Noted   Dyslipidemia 07/17/2021    Lymphedema 07/08/2021   Melena    Gastric polyps    UGIB (upper gastrointestinal bleed) 04/06/2021   Acute encephalopathy 12/21/2020   COPD with acute exacerbation ([place]) 12/21/2020   Chronic diastolic (congestive) heart failure ([place]) 11/14/2020   Syncope 10/28/2020   CHF (congestive heart failure) ([place]) 10/28/2020   Chronic venous stasis dermatitis of both lower extremities 08/27/2020   Cellulitis of both lower extremities 08/14/2020   Iron deficiency anemia 08/14/2020   GERD without esophagitis 06/10/2020   Atrial fibrillation with rapid ventricular response ([place]) 03/24/2020   Acute on chronic right heart failure ([place]) 01/27/2020   Acute respiratory failure with hypoxia (HCC)    Fatty liver disease, nonalcoholic    Shock ([place])    Personal history of DVT (deep vein thrombosis) 12/26/2019   Chronic anticoagulation 12/26/2019   Palpitations 12/26/2019   Paroxysmal atrial fibrillation ([place]) 12/23/2019   Heart failure, systolic, acute ([place]) 95/12/3265   Acute on chronic congestive heart failure (HCC)    Acute on chronic diastolic CHF (congestive heart failure) ([place]) 08/23/2019   Chest pain 08/23/2019   Lactic acidosis 08/23/2019   History of pulmonary embolism 08/12/2019   Atrial tachycardia ([place]) 08/12/2019   Occasional tremors 08/12/2019   OSA (obstructive sleep apnea) 08/12/2019   AKI (acute kidney injury) ([place]) 08/08/2019   Acute on chronic right-sided heart failure ([place]) 08/06/2019   Acute pulmonary embolism ([place]) 06/08/2019   Normocytic anemia 06/08/2019  Pulmonary emboli (HCC) 06/08/2019   Chronic heart failure with preserved ejection fraction ([place]) 05/30/2019   Class 2 severe obesity due to excess calories with serious comorbidity and body mass index (BMI) of 39.0 to 39.9 in adult [facility]) 05/30/2019   Morbid obesity with BMI of 40.0-44.9, adult ([place]) 05/30/2019   Hyponatremia    Drug induced constipation    Peripheral edema    Hypotension due to drugs     Hypoalbuminemia due to protein-calorie malnutrition (HCC)    Thrombocytopenia (HCC)    Anemia of chronic disease    Cirrhosis of liver without ascites (HCC)    Chronic pain syndrome    Debility 03/13/2019   Portal hypertensive gastropathy ([place]) 03/06/2019   Dyspnea    GIB (gastrointestinal bleeding) 03/04/2019   Mixed hyperlipidemia 01/24/2019   Insomnia 01/24/2019   Hyperlipidemia 01/24/2019   Essential hypertension 01/24/2019   Lumbar disc herniation 01/24/2019    Current Outpatient Medications:    apixaban (ELIQUIS) 5 MG TABS tablet, Take 1 tablet (5 mg total) by mouth 2 (two) times daily., Disp: 60 tablet, Rfl: 3   Buprenorphine HCl-Naloxone HCl 8-2 MG FILM, Place 1 tablet under the tongue 2 (two) times daily., Disp: , Rfl:    eplerenone (INSPRA) 50 MG tablet, TAKE 2 TABLETS BY MOUTH DAILY, Disp: 60 tablet, Rfl: 6   gabapentin (NEURONTIN) 800 MG tablet, Take 1 tablet (800 mg total) by mouth 3 (three) times daily., Disp: 90 tablet, Rfl: 1   lactulose, encephalopathy, (CHRONULAC) 10 GM/15ML SOLN, Take 30 mLs (20 g total) by mouth 3 (three) times daily., Disp: 236 mL, Rfl: 0   metolazone (ZAROXOLYN) 2.5 MG tablet, Take 1 tablet (2.5 mg total) by mouth 3 (three) times a week. Monday, Wednesday and Fridays, Disp: 20 tablet, Rfl: 0   metoprolol succinate (TOPROL XL) 25 MG 24 hr tablet, Take 1 tablet (25 mg total) by mouth at bedtime., Disp: 30 tablet, Rfl: 3   pantoprazole (PROTONIX) 40 MG tablet, Take 40 mg by mouth 2 (two) times daily before a meal., Disp: , Rfl:    polyethylene glycol (MIRALAX / GLYCOLAX) 17 g packet, Take 17 g by mouth as needed for mild constipation (mix and drink as directed)., Disp: , Rfl:    potassium chloride SA (KLOR-CON M) 20 MEQ tablet, Take 5 tablets (100 mEq total) by mouth 3 (three) times daily. Take one tablet extra (40 meq) with metolazone., Disp: 463 tablet, Rfl: 6   pravastatin (PRAVACHOL) 20 MG tablet, Take 1 tablet (20 mg total) by mouth daily., Disp: 30  tablet, Rfl: 3   sertraline (ZOLOFT) 50 MG tablet, Take 1 tablet (50 mg total) by mouth daily., Disp: 30 tablet, Rfl: 3   tamsulosin (FLOMAX) 0.4 MG CAPS capsule, Take 1 capsule (0.4 mg total) by mouth daily. Additional refills will need to come from PCP, Disp: 30 capsule, Rfl: 0   torsemide (DEMADEX) 100 MG tablet, Take 1 tablet (100 mg total) by mouth twice daily., Disp: 60 tablet, Rfl: 3   triamcinolone (KENALOG) 0.025 % ointment, Apply 1 application. topically 2 (two) times daily., Disp: 30 g, Rfl: 0   VENTOLIN HFA 108 (90 Base) MCG/ACT inhaler, Inhale 1 puff into the lungs every 4 (four) hours as needed for shortness of breath., Disp: 6.7 g, Rfl: 1   zolpidem (AMBIEN) 10 MG tablet, Take 1 tablet (10 mg total) by mouth at bedtime as needed for sleep., Disp: 30 tablet, Rfl: 0 Allergies  Allergen Reactions   Penicillins Other (See Comments)  Caused convulsions as a child  Did it involve swelling of the face/tongue/throat, SOB, or low BP? N/A Did it involve sudden or severe rash/hives, skin peeling, or any reaction on the inside of your mouth or nose? N/A Did you need to seek medical attention at a hospital or doctor's office?N/A When did it last happen? N/A If all above answers are "NO", may proceed with cephalosporin use.     Social History   Socioeconomic History   Marital status: Single    Spouse name: Not on file   Number of children: Not on file   Years of education: Not on file   Highest education level: Not on file  Occupational History   Not on file  Tobacco Use   Smoking status: Never   Smokeless tobacco: Never  Vaping Use   Vaping Use: Never used  Substance and Sexual Activity   Alcohol use: Not Currently   Drug use: Not Currently    Types: Marijuana    Comment: Smoked for 30 years   Sexual activity: Not on file  Other Topics Concern   Not on file  Social History Narrative   Not on file   Social Determinants of Health   Financial Resource Strain: Medium  Risk (11/15/2020)   Overall Financial Resource Strain (CARDIA)    Difficulty of Paying Living Expenses: Somewhat hard  Food Insecurity: No Food Insecurity (11/15/2020)   Hunger Vital Sign    Worried About Running Out of Food in the Last Year: Never true    Ran Out of Food in the Last Year: Never true  Transportation Needs: Unmet Transportation Needs (11/15/2020)   PRAPARE - Hydrologist (Medical): Yes    Lack of Transportation (Non-Medical): No  Physical Activity: Not on file  Stress: Not on file  Social Connections: Not on file  Intimate Partner Violence: Not on file    Physical Exam      Future Appointments  Date Time Provider [place]  01/07/2022  3:30 PM Lorenda Peck, DPM [facility]  02/23/2022  9:00 AM [facility] PA/NP [facility] None     ACTION: Home visit completed

## 2021-12-24 ENCOUNTER — Ambulatory Visit: Payer: Medicare HMO | Admitting: Podiatry

## 2021-12-30 ENCOUNTER — Telehealth (HOSPITAL_COMMUNITY): Payer: Self-pay

## 2021-12-30 DIAGNOSIS — F332 Major depressive disorder, recurrent severe without psychotic features: Secondary | ICD-10-CM | POA: Diagnosis not present

## 2021-12-30 DIAGNOSIS — F411 Generalized anxiety disorder: Secondary | ICD-10-CM | POA: Diagnosis not present

## 2021-12-30 DIAGNOSIS — F1120 Opioid dependence, uncomplicated: Secondary | ICD-10-CM | POA: Diagnosis not present

## 2021-12-30 NOTE — Telephone Encounter (Signed)
Called into [facility] to request the following refills for Mr. [place], [place] 12/30/2021

## 2022-01-01 ENCOUNTER — Other Ambulatory Visit (HOSPITAL_COMMUNITY): Payer: Self-pay

## 2022-01-01 DIAGNOSIS — I5032 Chronic diastolic (congestive) heart failure: Secondary | ICD-10-CM

## 2022-01-01 MED ORDER — METOLAZONE 2.5 MG PO TABS: 2.5000 mg | ORAL_TABLET | ORAL | 11 refills | Status: DC

## 2022-01-05 DIAGNOSIS — J441 Chronic obstructive pulmonary disease with (acute) exacerbation: Secondary | ICD-10-CM | POA: Diagnosis not present

## 2022-01-05 DIAGNOSIS — B079 Viral wart, unspecified: Secondary | ICD-10-CM | POA: Diagnosis not present

## 2022-01-05 DIAGNOSIS — I872 Venous insufficiency (chronic) (peripheral): Secondary | ICD-10-CM | POA: Diagnosis not present

## 2022-01-06 ENCOUNTER — Other Ambulatory Visit (HOSPITAL_COMMUNITY): Payer: Self-pay

## 2022-01-06 NOTE — Progress Notes (Signed)
Paramedicine Encounter    Patient ID: Charles Gray, male    DOB: 1956-01-27, 66 y.o.   MRN: [id number]   Arrived for home visit for Charles Gray who was seated in his recliner alert and oriented reporting to be feeling okay.  Mitchel reports he has had some weight gain and increased leg swelling over the last few days. His cardio mems was increasing over the weekend but is back down today to 7. His weight is up 6 lbs in two weeks. He did admit to eating more than normal over the last week due to it being his birthday. He denied shortness of breath, chest pain or dizziness.    He says he took his meds did not miss any doses of medications. Pill boxes empty indicating he did comply with all meds.   I obtained vitals and assessment.   WT- 402lbs BP- 130/68 HR- 80 O2- 97% RR- 16 LS- CLEAR  EDEMA- both lower legs swelling, weeping with redness.    He reports to me that he took an extra 1/2 metolazone today due to his leg swelling and weight gain. I advised him to be sure he is taking only what is prescribed.   I reminded him to maintain his fluid restrictions and his sodium restrictions. He verbalized agreement.   Meds reviewed and confirmed. I filled two weeks of pill boxes.   REFILLS:(next week) -Flomax -Zoloft -Gabapentin -Eliquis -Inspra   CMA that goes out daily reports that she ordered some compression stockings for him and they will be delivered tomorrow.   I called [employer] and confirmed his appeal on the leg wraps was approved and PCP placed order for leg wraps today. They will be placed twice a week.   I received a call at 6:00pm from his CNA who stated at 2:00 today he had a moment of dizziness and almost fell while walking and had a moment of confusion following this where he couldn't recall her name. Moments later he cried because he couldn't recall her name but then returned to his normal mental state and was laughing and talking normally. She said his speech was never slurred  during this time and he had no signs of facial droop. He denied dizziness after the incident and had no complaints. He see's podiatry tomorrow and I encouraged his CNA to ensure they check his BP while there. She agreed.  I advised them if it happens again to call 911. They agreed.   Home visit complete. I will see Isabel in two weeks.   [name], [place] 01/06/2022     Patient Care Team: Enid Skeens., MD as PCP - General (Family Medicine) Berniece Salines, DO as PCP - Cardiology (Cardiology) Jorge Ny, LCSW as Social Worker (Licensed Clinical Social Worker)  Patient Active Problem List   Diagnosis Date Noted   Dyslipidemia 07/17/2021   Lymphedema 07/08/2021   Melena    Gastric polyps    UGIB (upper gastrointestinal bleed) 04/06/2021   Acute encephalopathy 12/21/2020   COPD with acute exacerbation ([place]) 12/21/2020   Chronic diastolic (congestive) heart failure ([place]) 11/14/2020   Syncope 10/28/2020   CHF (congestive heart failure) ([place]) 10/28/2020   Chronic venous stasis dermatitis of both lower extremities 08/27/2020   Cellulitis of both lower extremities 08/14/2020   Iron deficiency anemia 08/14/2020   GERD without esophagitis 06/10/2020   Atrial fibrillation with rapid ventricular response ([place]) 03/24/2020   Acute on chronic right heart failure ([place]) 01/27/2020   Acute respiratory failure  with hypoxia ([place])    Fatty liver disease, nonalcoholic    Shock ([place])    Personal history of DVT (deep vein thrombosis) 12/26/2019   Chronic anticoagulation 12/26/2019   Palpitations 12/26/2019   Paroxysmal atrial fibrillation ([place]) 12/23/2019   Heart failure, systolic, acute ([place]) 44/04/270   Acute on chronic congestive heart failure (HCC)    Acute on chronic diastolic CHF (congestive heart failure) ([place]) 08/23/2019   Chest pain 08/23/2019   Lactic acidosis 08/23/2019   History of pulmonary embolism 08/12/2019   Atrial tachycardia ([place]) 08/12/2019    Occasional tremors 08/12/2019   OSA (obstructive sleep apnea) 08/12/2019   AKI (acute kidney injury) ([place]) 08/08/2019   Acute on chronic right-sided heart failure ([place]) 08/06/2019   Acute pulmonary embolism ([place]) 06/08/2019   Normocytic anemia 06/08/2019   Pulmonary emboli ([place]) 06/08/2019   Chronic heart failure with preserved ejection fraction ([place]) 05/30/2019   Class 2 severe obesity due to excess calories with serious comorbidity and body mass index (BMI) of 39.0 to 39.9 in adult ([place]) 05/30/2019   Morbid obesity with BMI of 40.0-44.9, adult ([place]) 05/30/2019   Hyponatremia    Drug induced constipation    Peripheral edema    Hypotension due to drugs    Hypoalbuminemia due to protein-calorie malnutrition (HCC)    Thrombocytopenia (HCC)    Anemia of chronic disease    Cirrhosis of liver without ascites ([place])    Chronic pain syndrome    Debility 03/13/2019   Portal hypertensive gastropathy ([place]) 03/06/2019   Dyspnea    GIB (gastrointestinal bleeding) 03/04/2019   Mixed hyperlipidemia 01/24/2019   Insomnia 01/24/2019   Hyperlipidemia 01/24/2019   Essential hypertension 01/24/2019   Lumbar disc herniation 01/24/2019    Current Outpatient Medications:    apixaban (ELIQUIS) 5 MG TABS tablet, Take 1 tablet (5 mg total) by mouth 2 (two) times daily., Disp: 60 tablet, Rfl: 3   Buprenorphine HCl-Naloxone HCl 8-2 MG FILM, Place 1 tablet under the tongue 2 (two) times daily., Disp: , Rfl:    eplerenone (INSPRA) 50 MG tablet, TAKE 2 TABLETS BY MOUTH DAILY, Disp: 60 tablet, Rfl: 6   gabapentin (NEURONTIN) 800 MG tablet, Take 1 tablet (800 mg total) by mouth 3 (three) times daily., Disp: 90 tablet, Rfl: 1   lactulose, encephalopathy, (CHRONULAC) 10 GM/15ML SOLN, Take 30 mLs (20 g total) by mouth 3 (three) times daily., Disp: 236 mL, Rfl: 0   metolazone (ZAROXOLYN) 2.5 MG tablet, Take 1 tablet (2.5 mg total) by mouth 3 (three) times a week. Monday, Wednesday and Fridays, Disp: 30 tablet, Rfl:  11   metoprolol succinate (TOPROL XL) 25 MG 24 hr tablet, Take 1 tablet (25 mg total) by mouth at bedtime., Disp: 30 tablet, Rfl: 3   pantoprazole (PROTONIX) 40 MG tablet, Take 40 mg by mouth 2 (two) times daily before a meal., Disp: , Rfl:    polyethylene glycol (MIRALAX / GLYCOLAX) 17 g packet, Take 17 g by mouth as needed for mild constipation (mix and drink as directed)., Disp: , Rfl:    potassium chloride SA (KLOR-CON M) 20 MEQ tablet, Take 5 tablets (100 mEq total) by mouth 3 (three) times daily. Take one tablet extra (40 meq) with metolazone., Disp: 463 tablet, Rfl: 6   pravastatin (PRAVACHOL) 20 MG tablet, Take 1 tablet (20 mg total) by mouth daily., Disp: 30 tablet, Rfl: 3   sertraline (ZOLOFT) 50 MG tablet, Take 1 tablet (50 mg total) by mouth daily., Disp: 30 tablet, Rfl:  3   tamsulosin (FLOMAX) 0.4 MG CAPS capsule, Take 1 capsule (0.4 mg total) by mouth daily. Additional refills will need to come from PCP, Disp: 30 capsule, Rfl: 0   torsemide (DEMADEX) 100 MG tablet, Take 1 tablet (100 mg total) by mouth twice daily., Disp: 60 tablet, Rfl: 3   triamcinolone (KENALOG) 0.025 % ointment, Apply 1 application. topically 2 (two) times daily., Disp: 30 g, Rfl: 0   VENTOLIN HFA 108 (90 Base) MCG/ACT inhaler, Inhale 1 puff into the lungs every 4 (four) hours as needed for shortness of breath., Disp: 6.7 g, Rfl: 1   zolpidem (AMBIEN) 10 MG tablet, Take 1 tablet (10 mg total) by mouth at bedtime as needed for sleep., Disp: 30 tablet, Rfl: 0 Allergies  Allergen Reactions   Penicillins Other (See Comments)    Caused convulsions as a child  Did it involve swelling of the face/tongue/throat, SOB, or low BP? N/A Did it involve sudden or severe rash/hives, skin peeling, or any reaction on the inside of your mouth or nose? N/A Did you need to seek medical attention at a hospital or doctor's office?N/A When did it last happen? N/A If all above answers are "NO", may proceed with cephalosporin use.      Social History   Socioeconomic History   Marital status: Single    Spouse name: Not on file   Number of children: Not on file   Years of education: Not on file   Highest education level: Not on file  Occupational History   Not on file  Tobacco Use   Smoking status: Never   Smokeless tobacco: Never  Vaping Use   Vaping Use: Never used  Substance and Sexual Activity   Alcohol use: Not Currently   Drug use: Not Currently    Types: Marijuana    Comment: Smoked for 30 years   Sexual activity: Not on file  Other Topics Concern   Not on file  Social History Narrative   Not on file   Social Determinants of Health   Financial Resource Strain: Medium Risk (11/15/2020)   Overall Financial Resource Strain (CARDIA)    Difficulty of Paying Living Expenses: Somewhat hard  Food Insecurity: No Food Insecurity (11/15/2020)   Hunger Vital Sign    Worried About Running Out of Food in the Last Year: Never true    Ran Out of Food in the Last Year: Never true  Transportation Needs: Unmet Transportation Needs (11/15/2020)   PRAPARE - Hydrologist (Medical): Yes    Lack of Transportation (Non-Medical): No  Physical Activity: Not on file  Stress: Not on file  Social Connections: Not on file  Intimate Partner Violence: Not on file    Physical Exam      Future Appointments  Date Time Provider [place]  01/07/2022  3:30 PM Lorenda Peck, DPM [facility]  02/23/2022  9:00 AM [facility] PA/NP [facility] None     ACTION: Home visit completed

## 2022-01-07 ENCOUNTER — Ambulatory Visit: Payer: Medicare HMO | Admitting: Podiatry

## 2022-01-15 DIAGNOSIS — Z7901 Long term (current) use of anticoagulants: Secondary | ICD-10-CM | POA: Diagnosis not present

## 2022-01-15 DIAGNOSIS — Z9181 History of falling: Secondary | ICD-10-CM | POA: Diagnosis not present

## 2022-01-15 DIAGNOSIS — I872 Venous insufficiency (chronic) (peripheral): Secondary | ICD-10-CM | POA: Diagnosis not present

## 2022-01-15 DIAGNOSIS — I509 Heart failure, unspecified: Secondary | ICD-10-CM | POA: Diagnosis not present

## 2022-01-19 ENCOUNTER — Ambulatory Visit (INDEPENDENT_AMBULATORY_CARE_PROVIDER_SITE_OTHER): Payer: Medicare HMO | Admitting: Podiatry

## 2022-01-19 DIAGNOSIS — M79674 Pain in right toe(s): Secondary | ICD-10-CM | POA: Diagnosis not present

## 2022-01-19 DIAGNOSIS — B351 Tinea unguium: Secondary | ICD-10-CM

## 2022-01-19 DIAGNOSIS — B070 Plantar wart: Secondary | ICD-10-CM | POA: Diagnosis not present

## 2022-01-19 DIAGNOSIS — M79675 Pain in left toe(s): Secondary | ICD-10-CM | POA: Diagnosis not present

## 2022-01-19 DIAGNOSIS — R600 Localized edema: Secondary | ICD-10-CM | POA: Diagnosis not present

## 2022-01-19 NOTE — Progress Notes (Signed)
  Subjective:  Patient ID: Charles Gray, male    DOB: [birth year],  MRN: [id number]  Chief Complaint  Patient presents with   Toe Pain    1st toe on right foot , pt reports throbbing on toe , pt states his toe got stuck last week on rug and there was bleeding from the area    66 y.o. male presents with the above complaint. History confirmed with patient. Patient presenting with pain related to dystrophic thickened elongated nails. Patient is unable to trim own nails related to nail dystrophy and/or mobility issues. Patient does not have a history of T2DM but does have history of peripheral neuropathy. Patient does have pain at the plantar medial aspect of the right hallux where there is a raised lesion present, says it has bene present for years. Recently the lesion caught on a rug and had increased pain and bleeding.   Objective:  Physical Exam: warm, good capillary refill, DP and PT pulses non palpable 2/2 edema bilateral, Sensation diminished to light touch/protective sensation absent at the digits bilaterally nail exam onychomycosis of the toenails, onycholysis, and dystrophic nails Left Foot:  Pain with palpation of nails due to elongation and dystrophic growth.  Right Foot: Pain with palpation of nails due to elongation and dystrophic growth. Hyperkeratotic lesion present plantar medial aspect of right hallux with pain on palpation and pinpoint bleeding after debridement of the lesion. Consistent with plantar wart Right hallux.  Severe non pitting edema of the bilateral lower extremity with lichenification/ scarring of the lower legs and erythema related to chronic edema.   Assessment:   1. Pain due to onychomycosis of toenails of both feet   2. Verruca plantaris   3. Bilateral lower extremity edema      Plan:  Patient was evaluated and treated and all questions answered.   #verruca plantaris right great toe plantar medial aspect - Debrided the lesion with a 10 blade. -  Recommend OTC topical wart treatment such as salicylic acid for time being, Patient is at risk for cantharone treatment due to edema and comorbidities.  - Keep the lesion covered with band aid until it has healed in fully.   #Onychomycosis with pain  -Nails palliatively debrided as below. -Educated on self-care  Procedure: Nail Debridement Rationale: Pain Type of Debridement: manual, sharp debridement. Instrumentation: Nail nipper, rotary burr. Number of Nails: 10  # bilateral LE edema - follow up with PCP for ongoing treatment of chronic edema - recommend compression dressings bilaterally which he is getting 1x weekly per home health.   Return in about 3 months (around 04/21/2022) for RFC.         Everitt Amber, DPM [facility]

## 2022-01-20 ENCOUNTER — Other Ambulatory Visit (HOSPITAL_COMMUNITY): Payer: Self-pay | Admitting: Emergency Medicine

## 2022-01-20 DIAGNOSIS — J9601 Acute respiratory failure with hypoxia: Secondary | ICD-10-CM | POA: Diagnosis not present

## 2022-01-20 DIAGNOSIS — I50813 Acute on chronic right heart failure: Secondary | ICD-10-CM | POA: Diagnosis not present

## 2022-01-20 DIAGNOSIS — R531 Weakness: Secondary | ICD-10-CM | POA: Diagnosis not present

## 2022-01-20 NOTE — Progress Notes (Signed)
Paramedicine Encounter    Patient ID: Charles Gray,     DOB: 10-Dec-1955, 66 y.o.   MRN: [id number]   There were no vitals taken for this visit. Weight yesterday-397lb Last visit weight-402lb   ATF Mr. Heckendorn sitting in recliner in his residence.  Pill box was empty indicating he has taken all his meds as prescribed.  Box fill x 1 week.   Charles Gray's weight is up 4lbs in 1 day and his BP is elevated.  Contacted clinic and was advised to have him take his Wednesday's dose of Metalozone today w/ the xtra Potassium.  He did so in my presence.  He denies chest pain or SOB.  His lung sounds are diminished in the bases.  Extreme redness and edema to his lower extremities with some weeping.  He rates his pain in his legs 7/10.   I advised him myself or Nira Conn would reach out to him tomorrow to see how he is feeling.  Home visit complete.    Renee Ramus, [place] 01/20/2022   Patient Care Team: Enid Skeens., MD as PCP - General (Family Medicine) Berniece Salines, DO as PCP - Cardiology (Cardiology) Jorge Ny, LCSW as Social Worker (Licensed Clinical Social Worker)  Patient Active Problem List   Diagnosis Date Noted   Dyslipidemia 07/17/2021   Lymphedema 07/08/2021   Melena    Gastric polyps    UGIB (upper gastrointestinal bleed) 04/06/2021   Acute encephalopathy 12/21/2020   COPD with acute exacerbation ([place]) 12/21/2020   Chronic diastolic (congestive) heart failure ([place]) 11/14/2020   Syncope 10/28/2020   CHF (congestive heart failure) ([place]) 10/28/2020   Chronic venous stasis dermatitis of both lower extremities 08/27/2020   Cellulitis of both lower extremities 08/14/2020   Iron deficiency anemia 08/14/2020   GERD without esophagitis 06/10/2020   Atrial fibrillation with rapid ventricular response ([place]) 03/24/2020   Acute on chronic right heart failure ([place]) 01/27/2020   Acute respiratory failure with hypoxia (HCC)    Fatty liver disease, nonalcoholic     Shock ([place])    Personal history of DVT (deep vein thrombosis) 12/26/2019   Chronic anticoagulation 12/26/2019   Palpitations 12/26/2019   Paroxysmal atrial fibrillation ([place]) 12/23/2019   Heart failure, systolic, acute ([place]) 38/46/6599   Acute on chronic congestive heart failure (HCC)    Acute on chronic diastolic CHF (congestive heart failure) ([place]) 08/23/2019   Chest pain 08/23/2019   Lactic acidosis 08/23/2019   History of pulmonary embolism 08/12/2019   Atrial tachycardia 08/12/2019   Occasional tremors 08/12/2019   OSA (obstructive sleep apnea) 08/12/2019   AKI (acute kidney injury) ([place]) 08/08/2019   Acute on chronic right-sided heart failure ([place]) 08/06/2019   Acute pulmonary embolism ([place]) 06/08/2019   Normocytic anemia 06/08/2019   Pulmonary emboli ([place]) 06/08/2019   Chronic heart failure with preserved ejection fraction ([place]) 05/30/2019   Class 2 severe obesity due to excess calories with serious comorbidity and body mass index (BMI) of 39.0 to 39.9 in adult ([place]) 05/30/2019   Morbid obesity with BMI of 40.0-44.9, adult ([place]) 05/30/2019   Hyponatremia    Drug induced constipation    Peripheral edema    Hypotension due to drugs    Hypoalbuminemia due to protein-calorie malnutrition (HCC)    Thrombocytopenia (HCC)    Anemia of chronic disease    Cirrhosis of liver without ascites ([place])    Chronic pain syndrome    Debility 03/13/2019   Portal hypertensive gastropathy ([place]) 03/06/2019   Dyspnea  GIB (gastrointestinal bleeding) 03/04/2019   Mixed hyperlipidemia 01/24/2019   Insomnia 01/24/2019   Hyperlipidemia 01/24/2019   Essential hypertension 01/24/2019   Lumbar disc herniation 01/24/2019    Current Outpatient Medications:    apixaban (ELIQUIS) 5 MG TABS tablet, Take 1 tablet (5 mg total) by mouth 2 (two) times daily., Disp: 60 tablet, Rfl: 3   Buprenorphine HCl-Naloxone HCl 8-2 MG FILM, Place 1 tablet under the tongue 2 (two) times daily., Disp: , Rfl:     eplerenone (INSPRA) 50 MG tablet, TAKE 2 TABLETS BY MOUTH DAILY, Disp: 60 tablet, Rfl: 6   gabapentin (NEURONTIN) 800 MG tablet, Take 1 tablet (800 mg total) by mouth 3 (three) times daily., Disp: 90 tablet, Rfl: 1   lactulose, encephalopathy, (CHRONULAC) 10 GM/15ML SOLN, Take 30 mLs (20 g total) by mouth 3 (three) times daily., Disp: 236 mL, Rfl: 0   metolazone (ZAROXOLYN) 2.5 MG tablet, Take 1 tablet (2.5 mg total) by mouth 3 (three) times a week. Monday, Wednesday and Fridays, Disp: 30 tablet, Rfl: 11   metoprolol succinate (TOPROL XL) 25 MG 24 hr tablet, Take 1 tablet (25 mg total) by mouth at bedtime., Disp: 30 tablet, Rfl: 3   pantoprazole (PROTONIX) 40 MG tablet, Take 40 mg by mouth 2 (two) times daily before a meal., Disp: , Rfl:    polyethylene glycol (MIRALAX / GLYCOLAX) 17 g packet, Take 17 g by mouth as needed for mild constipation (mix and drink as directed)., Disp: , Rfl:    potassium chloride SA (KLOR-CON M) 20 MEQ tablet, Take 5 tablets (100 mEq total) by mouth 3 (three) times daily. Take one tablet extra (40 meq) with metolazone., Disp: 463 tablet, Rfl: 6   pravastatin (PRAVACHOL) 20 MG tablet, Take 1 tablet (20 mg total) by mouth daily., Disp: 30 tablet, Rfl: 3   sertraline (ZOLOFT) 50 MG tablet, Take 1 tablet (50 mg total) by mouth daily., Disp: 30 tablet, Rfl: 3   tamsulosin (FLOMAX) 0.4 MG CAPS capsule, Take 1 capsule (0.4 mg total) by mouth daily. Additional refills will need to come from PCP, Disp: 30 capsule, Rfl: 0   torsemide (DEMADEX) 100 MG tablet, Take 1 tablet (100 mg total) by mouth twice daily., Disp: 60 tablet, Rfl: 3   triamcinolone (KENALOG) 0.025 % ointment, Apply 1 application. topically 2 (two) times daily., Disp: 30 g, Rfl: 0   VENTOLIN HFA 108 (90 Base) MCG/ACT inhaler, Inhale 1 puff into the lungs every 4 (four) hours as needed for shortness of breath., Disp: 6.7 g, Rfl: 1   zolpidem (AMBIEN) 10 MG tablet, Take 1 tablet (10 mg total) by mouth at bedtime as  needed for sleep., Disp: 30 tablet, Rfl: 0 Allergies  Allergen Reactions   Penicillins Other (See Comments)    Caused convulsions as a child  Did it involve swelling of the face/tongue/throat, SOB, or low BP? N/A Did it involve sudden or severe rash/hives, skin peeling, or any reaction on the inside of your mouth or nose? N/A Did you need to seek medical attention at a hospital or doctor's office?N/A When did it last happen? N/A If all above answers are "NO", may proceed with cephalosporin use.      Social History   Socioeconomic History   Marital status: Single    Spouse name: Not on file   Number of children: Not on file   Years of education: Not on file   Highest education level: Not on file  Occupational History   Not on  file  Tobacco Use   Smoking status: Never   Smokeless tobacco: Never  Vaping Use   Vaping Use: Never used  Substance and Sexual Activity   Alcohol use: Not Currently   Drug use: Not Currently    Types: Marijuana    Comment: Smoked for 30 years   Sexual activity: Not on file  Other Topics Concern   Not on file  Social History Narrative   Not on file   Social Determinants of Health   Financial Resource Strain: Medium Risk (11/15/2020)   Overall Financial Resource Strain (CARDIA)    Difficulty of Paying Living Expenses: Somewhat hard  Food Insecurity: No Food Insecurity (11/15/2020)   Hunger Vital Sign    Worried About Running Out of Food in the Last Year: Never true    Ran Out of Food in the Last Year: Never true  Transportation Needs: Unmet Transportation Needs (11/15/2020)   PRAPARE - Hydrologist (Medical): Yes    Lack of Transportation (Non-Medical): No  Physical Activity: Not on file  Stress: Not on file  Social Connections: Not on file  Intimate Partner Violence: Not on file    Physical Exam      Future Appointments  Date Time Provider [place]  02/23/2022  9:00 AM [facility] PA/NP [facility] None   04/21/2022  3:45 PM [place], Nena Alexander, DPM [facility]       Renee Ramus, [place] [facility] Par[facility]ic  01/20/22

## 2022-01-27 ENCOUNTER — Other Ambulatory Visit (HOSPITAL_COMMUNITY): Payer: Self-pay

## 2022-01-27 IMAGING — CR DG CHEST 2V
2 series · 2 of 2 positions shown · non-contrast
Comparison: 09/25/2019 chest radiograph and prior.

CLINICAL DATA: Shortness of breath

EXAM:
CHEST - 2 VIEW

[chest pa]
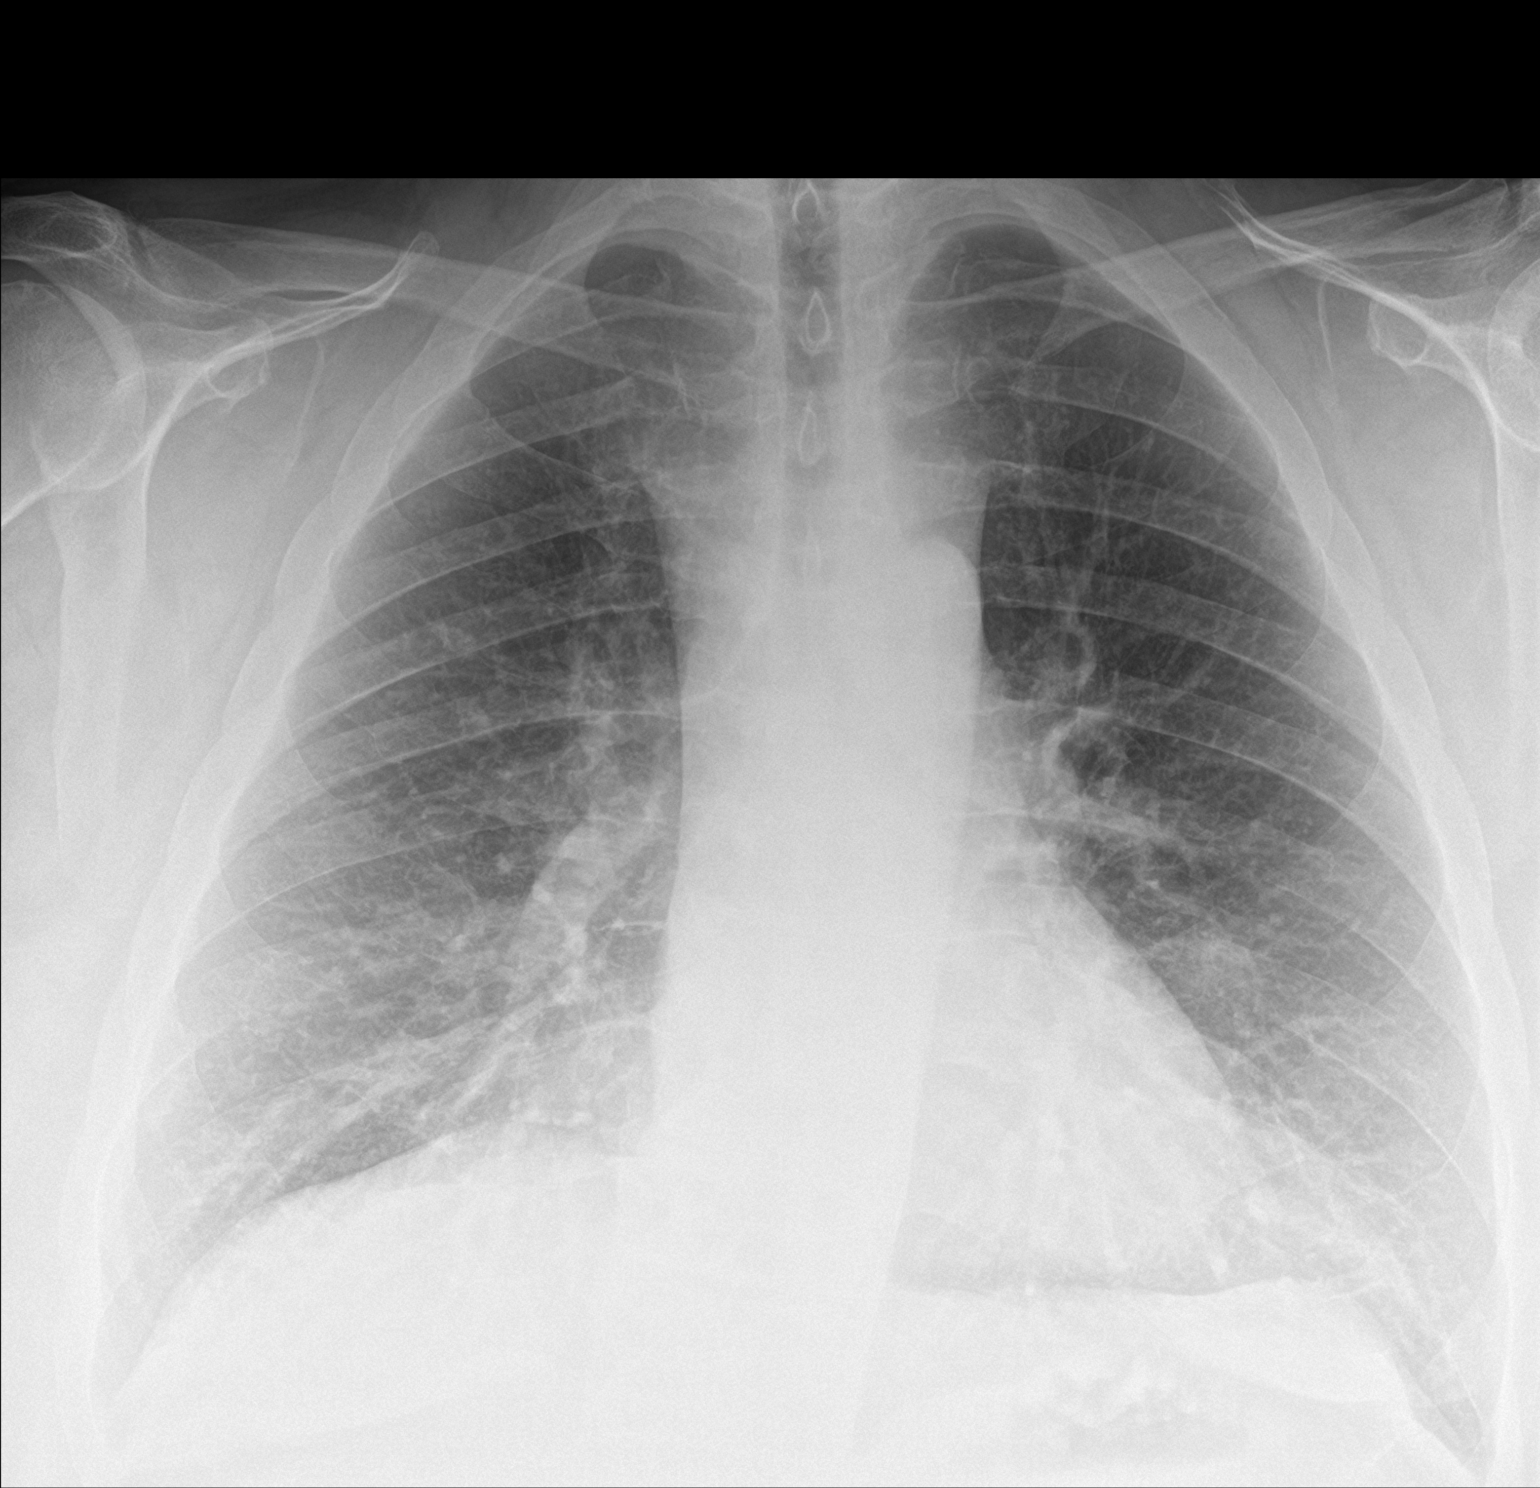

[chest lat]
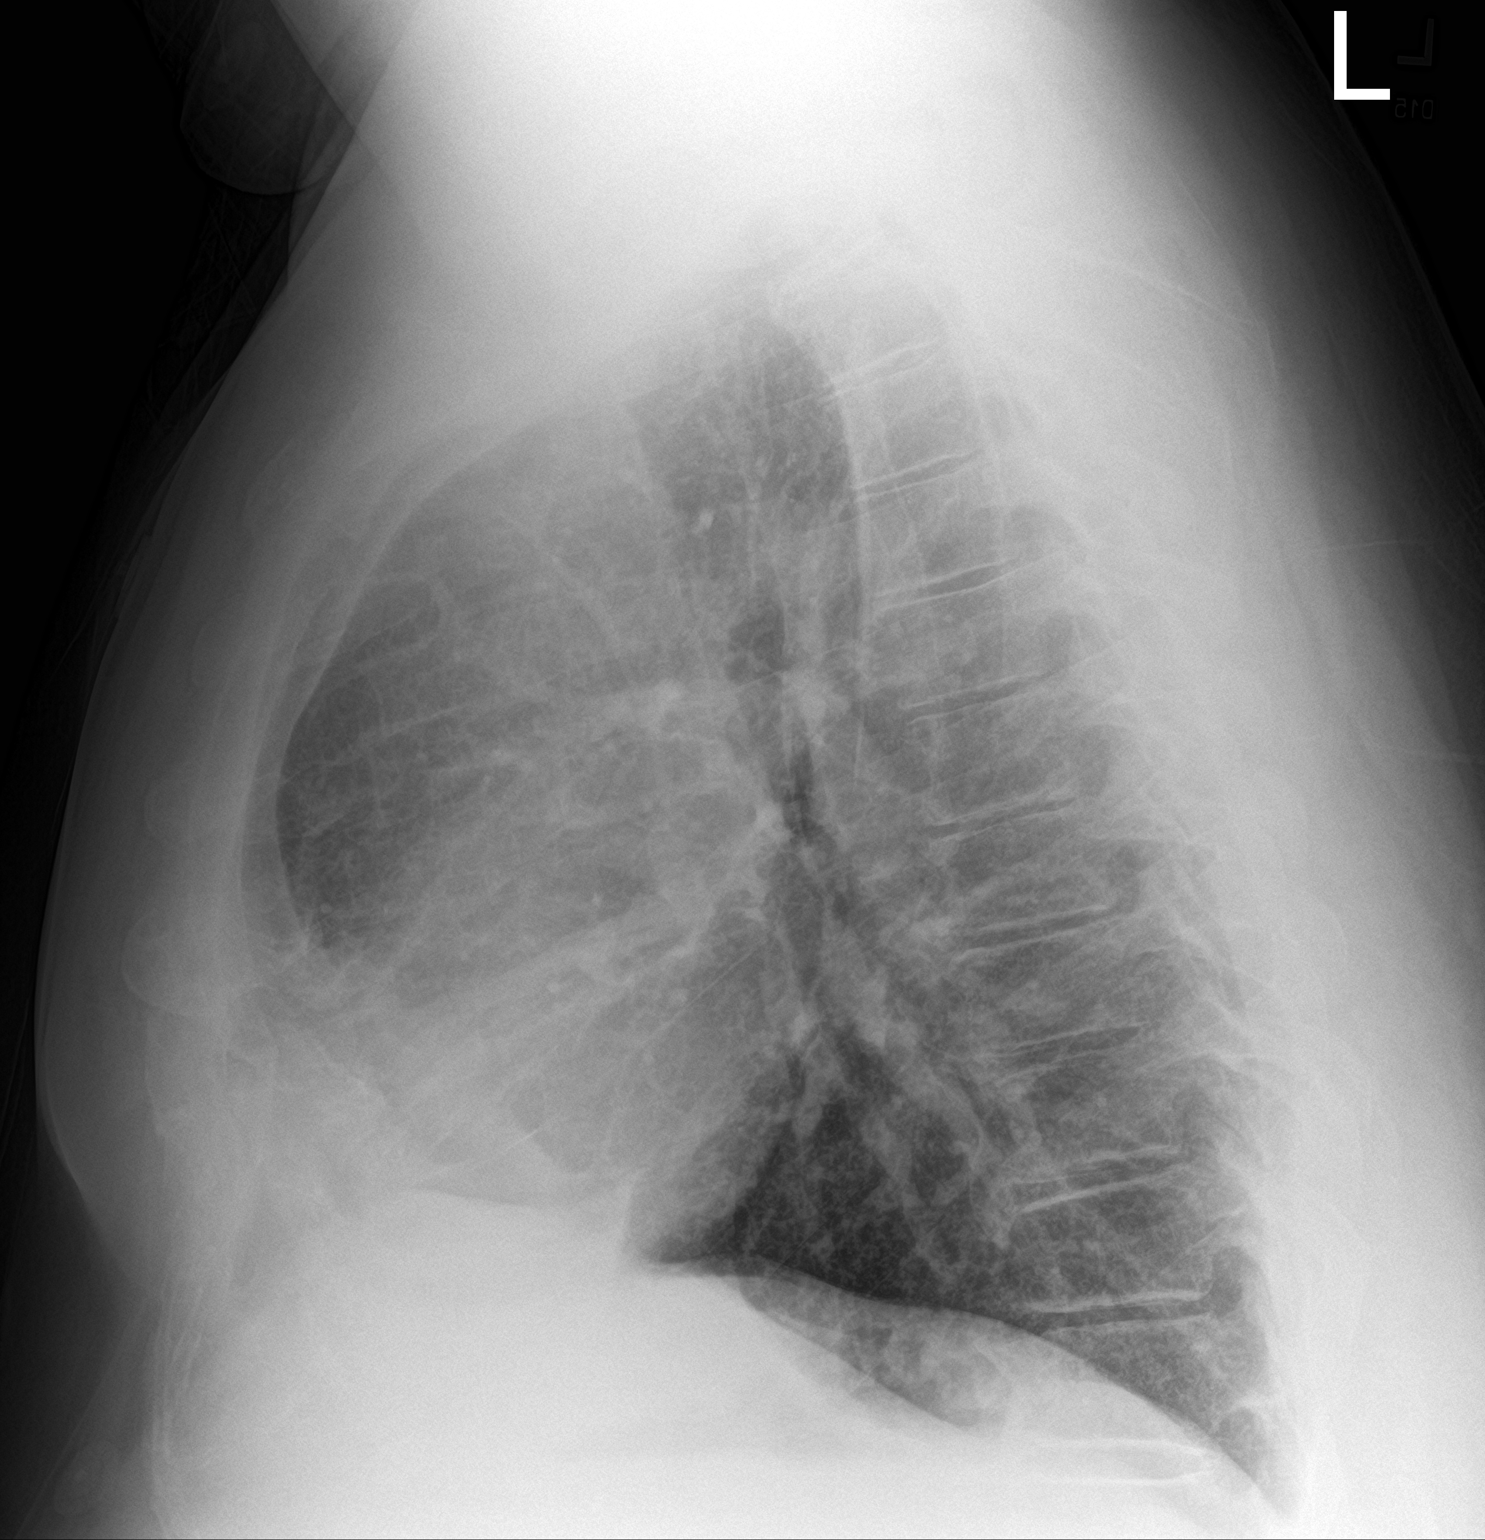

[2 of 2 positions shown; findings below may reference images not displayed]

FINDINGS: Bibasilar opacities, likely atelectasis. No pneumothorax or pleural
effusion. Cardiomediastinal silhouette within normal limits. No
acute osseous abnormality. Mild multilevel spondylosis.
IMPRESSION: Bibasilar opacities, likely atelectasis.

## 2022-01-27 NOTE — Progress Notes (Signed)
Paramedicine Encounter    Patient ID: Charles Gray, male    DOB: 1956/02/17, 66 y.o.   MRN: [id number]   Arrived for home visit for Charles Gray who reports to be feeling not well today. He reports lower leg swelling, leg pain, generalized aches and pains, fatigue and shortness of breath. Today [name]'s weight is up 10 lbs since last week. He admits to skipping metolazone and eating high sodium foods and drinking >2L daily. I advised him the importance of missing medications and how it negatively impacts his overall health and wellbeing. He verbalized understanding and advised he will try to do better.   I obtained vitals and they are as noted: WT- 411lbs BP- 118/68 HR- 77 O2- 98% RR- 16 Lungs- clear  Edema- severe lower leg swelling, redness, weeping.   He has had his legs wrapped once since last week but reports he had to take them off the next day for his foot doctors appointment. The RN will return Thursday for re-wrapping them. He has not been wearing compression stockings in between leg wraps. I assisted him today in placing compression stockings on during our visit and instructed him to leave them on daily until he gets ready for bed and then remove them. He verbalized understanding.   I reminded him of his salt and fluid restrictions and the importance of adhering to this diet. He verbalized understanding.   He tells me he took 1/2 metolazone yesterday- he says he skips them sometimes because they make him feel so bad. I advised him the dangers of missing meds and that the results of missing them are far worse than the temporary symptoms he may experience from frequent urination from metolazone. He submitted a cardiomems reading today and it was below goal at 6. However his weight is up 10lbs and his legs are evident he is retaining fluid.   He is short of breath on exertion and has to stop about 15 feet to take a break. He is not exercising and is severely deconditioned. He verbalized  understanding when I advised him to ensure he tries to get up and move around daily to help with this.   I reviewed meds and filled pill box accordingly for two weeks. He will need refills on the following: -Torsemide -Pantoprazole   We reviewed upcoming appointments and confirmed same. I will call in one week to check in over the phone and home visit in two weeks. I explained the importance of his compliance to remain in the paramedicine program and he verbalized understanding. Home visit complete.   Charles Gray, [place] 01/27/2022     Patient Care Team: Enid Skeens., MD as PCP - General (Family Medicine) Berniece Salines, DO as PCP - Cardiology (Cardiology) Jorge Ny, LCSW as Social Worker (Licensed Clinical Social Worker)  Patient Active Problem List   Diagnosis Date Noted   Dyslipidemia 07/17/2021   Lymphedema 07/08/2021   Melena    Gastric polyps    UGIB (upper gastrointestinal bleed) 04/06/2021   Acute encephalopathy 12/21/2020   COPD with acute exacerbation ([place]) 12/21/2020   Chronic diastolic (congestive) heart failure ([place]) 11/14/2020   Syncope 10/28/2020   CHF (congestive heart failure) ([place]) 10/28/2020   Chronic venous stasis dermatitis of both lower extremities 08/27/2020   Cellulitis of both lower extremities 08/14/2020   Iron deficiency anemia 08/14/2020   GERD without esophagitis 06/10/2020   Atrial fibrillation with rapid ventricular response ([place]) 03/24/2020   Acute on chronic right heart failure ([place])  01/27/2020   Acute respiratory failure with hypoxia (HCC)    Fatty liver disease, nonalcoholic    Shock ([place])    Personal history of DVT (deep vein thrombosis) 12/26/2019   Chronic anticoagulation 12/26/2019   Palpitations 12/26/2019   Paroxysmal atrial fibrillation ([place]) 12/23/2019   Heart failure, systolic, acute ([place]) 85/05/7739   Acute on chronic congestive heart failure (HCC)    Acute on chronic diastolic CHF (congestive  heart failure) ([place]) 08/23/2019   Chest pain 08/23/2019   Lactic acidosis 08/23/2019   History of pulmonary embolism 08/12/2019   Atrial tachycardia 08/12/2019   Occasional tremors 08/12/2019   OSA (obstructive sleep apnea) 08/12/2019   AKI (acute kidney injury) ([place]) 08/08/2019   Acute on chronic right-sided heart failure ([place]) 08/06/2019   Acute pulmonary embolism ([place]) 06/08/2019   Normocytic anemia 06/08/2019   Pulmonary emboli ([place]) 06/08/2019   Chronic heart failure with preserved ejection fraction ([place]) 05/30/2019   Class 2 severe obesity due to excess calories with serious comorbidity and body mass index (BMI) of 39.0 to 39.9 in adult ([place]) 05/30/2019   Morbid obesity with BMI of 40.0-44.9, adult ([place]) 05/30/2019   Hyponatremia    Drug induced constipation    Peripheral edema    Hypotension due to drugs    Hypoalbuminemia due to protein-calorie malnutrition (HCC)    Thrombocytopenia (HCC)    Anemia of chronic disease    Cirrhosis of liver without ascites ([place])    Chronic pain syndrome    Debility 03/13/2019   Portal hypertensive gastropathy ([place]) 03/06/2019   Dyspnea    GIB (gastrointestinal bleeding) 03/04/2019   Mixed hyperlipidemia 01/24/2019   Insomnia 01/24/2019   Hyperlipidemia 01/24/2019   Essential hypertension 01/24/2019   Lumbar disc herniation 01/24/2019    Current Outpatient Medications:    apixaban (ELIQUIS) 5 MG TABS tablet, Take 1 tablet (5 mg total) by mouth 2 (two) times daily., Disp: 60 tablet, Rfl: 3   Buprenorphine HCl-Naloxone HCl 8-2 MG FILM, Place 1 tablet under the tongue 2 (two) times daily., Disp: , Rfl:    eplerenone (INSPRA) 50 MG tablet, TAKE 2 TABLETS BY MOUTH DAILY, Disp: 60 tablet, Rfl: 6   gabapentin (NEURONTIN) 800 MG tablet, Take 1 tablet (800 mg total) by mouth 3 (three) times daily., Disp: 90 tablet, Rfl: 1   lactulose, encephalopathy, (CHRONULAC) 10 GM/15ML SOLN, Take 30 mLs (20 g total) by mouth 3 (three) times daily., Disp: 236  mL, Rfl: 0   metolazone (ZAROXOLYN) 2.5 MG tablet, Take 1 tablet (2.5 mg total) by mouth 3 (three) times a week. Monday, Wednesday and Fridays, Disp: 30 tablet, Rfl: 11   metoprolol succinate (TOPROL XL) 25 MG 24 hr tablet, Take 1 tablet (25 mg total) by mouth at bedtime., Disp: 30 tablet, Rfl: 3   pantoprazole (PROTONIX) 40 MG tablet, Take 40 mg by mouth 2 (two) times daily before a meal., Disp: , Rfl:    polyethylene glycol (MIRALAX / GLYCOLAX) 17 g packet, Take 17 g by mouth as needed for mild constipation (mix and drink as directed)., Disp: , Rfl:    potassium chloride SA (KLOR-CON M) 20 MEQ tablet, Take 5 tablets (100 mEq total) by mouth 3 (three) times daily. Take one tablet extra (40 meq) with metolazone., Disp: 463 tablet, Rfl: 6   pravastatin (PRAVACHOL) 20 MG tablet, Take 1 tablet (20 mg total) by mouth daily., Disp: 30 tablet, Rfl: 3   sertraline (ZOLOFT) 50 MG tablet, Take 1 tablet (50 mg total) by mouth  daily., Disp: 30 tablet, Rfl: 3   tamsulosin (FLOMAX) 0.4 MG CAPS capsule, Take 1 capsule (0.4 mg total) by mouth daily. Additional refills will need to come from PCP, Disp: 30 capsule, Rfl: 0   torsemide (DEMADEX) 100 MG tablet, Take 1 tablet (100 mg total) by mouth twice daily., Disp: 60 tablet, Rfl: 3   triamcinolone (KENALOG) 0.025 % ointment, Apply 1 application. topically 2 (two) times daily., Disp: 30 g, Rfl: 0   VENTOLIN HFA 108 (90 Base) MCG/ACT inhaler, Inhale 1 puff into the lungs every 4 (four) hours as needed for shortness of breath., Disp: 6.7 g, Rfl: 1   zolpidem (AMBIEN) 10 MG tablet, Take 1 tablet (10 mg total) by mouth at bedtime as needed for sleep., Disp: 30 tablet, Rfl: 0 Allergies  Allergen Reactions   Penicillins Other (See Comments)    Caused convulsions as a child  Did it involve swelling of the face/tongue/throat, SOB, or low BP? N/A Did it involve sudden or severe rash/hives, skin peeling, or any reaction on the inside of your mouth or nose? N/A Did you need  to seek medical attention at a hospital or doctor's office?N/A When did it last happen? N/A If all above answers are "NO", may proceed with cephalosporin use.     Social History   Socioeconomic History   Marital status: Single    Spouse name: Not on file   Number of children: Not on file   Years of education: Not on file   Highest education level: Not on file  Occupational History   Not on file  Tobacco Use   Smoking status: Never   Smokeless tobacco: Never  Vaping Use   Vaping Use: Never used  Substance and Sexual Activity   Alcohol use: Not Currently   Drug use: Not Currently    Types: Marijuana    Comment: Smoked for 30 years   Sexual activity: Not on file  Other Topics Concern   Not on file  Social History Narrative   Not on file   Social Determinants of Health   Financial Resource Strain: Medium Risk (11/15/2020)   Overall Financial Resource Strain (CARDIA)    Difficulty of Paying Living Expenses: Somewhat hard  Food Insecurity: No Food Insecurity (11/15/2020)   Hunger Vital Sign    Worried About Running Out of Food in the Last Year: Never true    Ran Out of Food in the Last Year: Never true  Transportation Needs: Unmet Transportation Needs (11/15/2020)   PRAPARE - Hydrologist (Medical): Yes    Lack of Transportation (Non-Medical): No  Physical Activity: Not on file  Stress: Not on file  Social Connections: Not on file  Intimate Partner Violence: Not on file    Physical Exam      Future Appointments  Date Time Provider [place]  02/23/2022  9:00 AM [facility] PA/NP [facility] None  04/21/2022  3:45 PM [place], Nena Alexander, DPM [facility]     ACTION: Home visit completed

## 2022-01-29 ENCOUNTER — Other Ambulatory Visit (HOSPITAL_COMMUNITY): Payer: Self-pay

## 2022-01-29 MED ORDER — SERTRALINE HCL 50 MG PO TABS: 50.0000 mg | ORAL_TABLET | Freq: Every day | ORAL | 3 refills | Status: DC

## 2022-02-03 DIAGNOSIS — F332 Major depressive disorder, recurrent severe without psychotic features: Secondary | ICD-10-CM | POA: Diagnosis not present

## 2022-02-03 DIAGNOSIS — F411 Generalized anxiety disorder: Secondary | ICD-10-CM | POA: Diagnosis not present

## 2022-02-03 DIAGNOSIS — F1120 Opioid dependence, uncomplicated: Secondary | ICD-10-CM | POA: Diagnosis not present

## 2022-02-10 ENCOUNTER — Other Ambulatory Visit (HOSPITAL_COMMUNITY): Payer: Self-pay

## 2022-02-10 ENCOUNTER — Telehealth (HOSPITAL_COMMUNITY): Payer: Self-pay | Admitting: *Deleted

## 2022-02-10 NOTE — Telephone Encounter (Signed)
Heather w/paramedicine called to report pt c/o itchy and tender breast. Pt said its similar to when he was on spironolactone and thinks this may be related to his eplerenone.   Routed to [employer]

## 2022-02-10 NOTE — Progress Notes (Signed)
Paramedicine Encounter    Patient ID: Charles Gray, male    DOB: 08/31/1955, 66 y.o.   MRN: [id number]  Arrived for home visit for Charles Gray who reports he is generally not feeling well complaining of soreness in his legs, shortness of breath on exertion and dizziness when bending over for extended periods of time. He also reports breast tenderness, swelling and itching. He believes this is related to his Inspra. I will forward to HF clinic triage for further and have them advise. I will not place medication in his pill box until we hear otherwise. He is agreeable. I also reminded him the importance of exercise and he limits himself to walking to bathroom and to kitchen only daily. I encouraged him that exercise will help with deconditioning symptoms. He verbalized understanding.  I obtained vitals and assessment. He is wearing compression socks today but reports he has had them on >12 hours and I reminded him the importance to take them off before bed. He agreed with plan. He says he is not sure if their helping with his swelling or not. I can see a visible difference in size on assessment today.   Weight is down 19lbs since our last visit. Lungs clear today, cardio mems is below goal at 7. He appears euvolemic today.   Vitals as noted: (meds were taken at 0900) WT- 392lbs BP- 140/80 HR- 85 O2- 98% RR- 16   I reviewed meds and filled two weeks worth of pill boxes. Refills as noted below.  -ambien -zoloft -eliquis -tamsulosin -metolazone   We reviewed upcoming appointments and confirmed same. He plans to set up his transportation closer to time. I will meet him at this appointment on Nov. 13 at 0900. He agreed and was reminded to bring all meds and pill boxes.   Home visit complete.   [name], [place] 02/10/2022      Patient Care Team: Enid Skeens., MD as PCP - General (Family Medicine) Berniece Salines, DO as PCP - Cardiology (Cardiology) Jorge Ny, LCSW as Social Worker (Licensed Clinical Social Worker)  Patient Active Problem List   Diagnosis Date Noted   Dyslipidemia 07/17/2021   Lymphedema 07/08/2021   Melena    Gastric polyps    UGIB (upper gastrointestinal bleed) 04/06/2021   Acute encephalopathy 12/21/2020   COPD with acute exacerbation ([place]) 12/21/2020   Chronic diastolic (congestive) heart failure ([place]) 11/14/2020   Syncope 10/28/2020   CHF (congestive heart failure) ([place]) 10/28/2020   Chronic venous stasis dermatitis of both lower extremities 08/27/2020   Cellulitis of both lower extremities 08/14/2020   Iron deficiency anemia 08/14/2020   GERD without esophagitis 06/10/2020   Atrial fibrillation with rapid ventricular response ([place]) 03/24/2020   Acute on chronic right heart failure ([place]) 01/27/2020   Acute respiratory failure with hypoxia (HCC)    Fatty liver disease, nonalcoholic    Shock ([place])    Personal history of DVT (deep vein thrombosis) 12/26/2019   Chronic anticoagulation 12/26/2019   Palpitations 12/26/2019   Paroxysmal atrial fibrillation ([place]) 12/23/2019   Heart failure, systolic, acute ([place]) 40/98/1191   Acute on chronic congestive heart failure (HCC)    Acute on chronic diastolic CHF (congestive heart failure) ([place]) 08/23/2019   Chest pain 08/23/2019   Lactic acidosis 08/23/2019   History of pulmonary embolism 08/12/2019   Atrial tachycardia 08/12/2019   Occasional tremors 08/12/2019   OSA (obstructive sleep apnea) 08/12/2019   AKI (acute kidney injury) ([place]) 08/08/2019   Acute on  chronic right-sided heart failure ([place]) 08/06/2019   Acute pulmonary embolism ([place]) 06/08/2019   Normocytic anemia 06/08/2019   Pulmonary emboli ([place]) 06/08/2019   Chronic heart failure with preserved ejection fraction ([place]) 05/30/2019   Class 2 severe obesity due to excess calories with serious comorbidity and body mass index (BMI) of 39.0 to 39.9 in adult [facility]) 05/30/2019   Morbid obesity with BMI of 40.0-44.9,  adult ([place]) 05/30/2019   Hyponatremia    Drug induced constipation    Peripheral edema    Hypotension due to drugs    Hypoalbuminemia due to protein-calorie malnutrition (HCC)    Thrombocytopenia (HCC)    Anemia of chronic disease    Cirrhosis of liver without ascites (HCC)    Chronic pain syndrome    Debility 03/13/2019   Portal hypertensive gastropathy ([place]) 03/06/2019   Dyspnea    GIB (gastrointestinal bleeding) 03/04/2019   Mixed hyperlipidemia 01/24/2019   Insomnia 01/24/2019   Hyperlipidemia 01/24/2019   Essential hypertension 01/24/2019   Lumbar disc herniation 01/24/2019    Current Outpatient Medications:    apixaban (ELIQUIS) 5 MG TABS tablet, Take 1 tablet (5 mg total) by mouth 2 (two) times daily., Disp: 60 tablet, Rfl: 3   Buprenorphine HCl-Naloxone HCl 8-2 MG FILM, Place 1 tablet under the tongue 2 (two) times daily., Disp: , Rfl:    eplerenone (INSPRA) 50 MG tablet, TAKE 2 TABLETS BY MOUTH DAILY, Disp: 60 tablet, Rfl: 6   gabapentin (NEURONTIN) 800 MG tablet, Take 1 tablet (800 mg total) by mouth 3 (three) times daily., Disp: 90 tablet, Rfl: 1   lactulose, encephalopathy, (CHRONULAC) 10 GM/15ML SOLN, Take 30 mLs (20 g total) by mouth 3 (three) times daily., Disp: 236 mL, Rfl: 0   metolazone (ZAROXOLYN) 2.5 MG tablet, Take 1 tablet (2.5 mg total) by mouth 3 (three) times a week. Monday, Wednesday and Fridays, Disp: 30 tablet, Rfl: 11   metoprolol succinate (TOPROL XL) 25 MG 24 hr tablet, Take 1 tablet (25 mg total) by mouth at bedtime., Disp: 30 tablet, Rfl: 3   pantoprazole (PROTONIX) 40 MG tablet, Take 40 mg by mouth 2 (two) times daily before a meal., Disp: , Rfl:    polyethylene glycol (MIRALAX / GLYCOLAX) 17 g packet, Take 17 g by mouth as needed for mild constipation (mix and drink as directed)., Disp: , Rfl:    potassium chloride SA (KLOR-CON M) 20 MEQ tablet, Take 5 tablets (100 mEq total) by mouth 3 (three) times daily. Take one tablet extra (40 meq) with  metolazone., Disp: 463 tablet, Rfl: 6   pravastatin (PRAVACHOL) 20 MG tablet, Take 1 tablet (20 mg total) by mouth daily., Disp: 30 tablet, Rfl: 3   sertraline (ZOLOFT) 50 MG tablet, Take 1 tablet (50 mg total) by mouth daily., Disp: 30 tablet, Rfl: 3   tamsulosin (FLOMAX) 0.4 MG CAPS capsule, Take 1 capsule (0.4 mg total) by mouth daily. Additional refills will need to come from PCP, Disp: 30 capsule, Rfl: 0   torsemide (DEMADEX) 100 MG tablet, Take 1 tablet (100 mg total) by mouth twice daily., Disp: 60 tablet, Rfl: 3   triamcinolone (KENALOG) 0.025 % ointment, Apply 1 application. topically 2 (two) times daily., Disp: 30 g, Rfl: 0   VENTOLIN HFA 108 (90 Base) MCG/ACT inhaler, Inhale 1 puff into the lungs every 4 (four) hours as needed for shortness of breath., Disp: 6.7 g, Rfl: 1   zolpidem (AMBIEN) 10 MG tablet, Take 1 tablet (10 mg total) by mouth at  bedtime as needed for sleep., Disp: 30 tablet, Rfl: 0 Allergies  Allergen Reactions   Penicillins Other (See Comments)    Caused convulsions as a child  Did it involve swelling of the face/tongue/throat, SOB, or low BP? N/A Did it involve sudden or severe rash/hives, skin peeling, or any reaction on the inside of your mouth or nose? N/A Did you need to seek medical attention at a hospital or doctor's office?N/A When did it last happen? N/A If all above answers are "NO", may proceed with cephalosporin use.     Social History   Socioeconomic History   Marital status: Single    Spouse name: Not on file   Number of children: Not on file   Years of education: Not on file   Highest education level: Not on file  Occupational History   Not on file  Tobacco Use   Smoking status: Never   Smokeless tobacco: Never  Vaping Use   Vaping Use: Never used  Substance and Sexual Activity   Alcohol use: Not Currently   Drug use: Not Currently    Types: Marijuana    Comment: Smoked for 30 years   Sexual activity: Not on file  Other Topics  Concern   Not on file  Social History Narrative   Not on file   Social Determinants of Health   Financial Resource Strain: Medium Risk (11/15/2020)   Overall Financial Resource Strain (CARDIA)    Difficulty of Paying Living Expenses: Somewhat hard  Food Insecurity: No Food Insecurity (11/15/2020)   Hunger Vital Sign    Worried About Running Out of Food in the Last Year: Never true    Ran Out of Food in the Last Year: Never true  Transportation Needs: Unmet Transportation Needs (11/15/2020)   PRAPARE - Hydrologist (Medical): Yes    Lack of Transportation (Non-Medical): No  Physical Activity: Not on file  Stress: Not on file  Social Connections: Not on file  Intimate Partner Violence: Not on file    Physical Exam      Future Appointments  Date Time Provider [place]  02/23/2022  9:00 AM [facility] PA/NP [facility] None  04/21/2022  3:45 PM [place], Nena Alexander, DPM [facility]     ACTION: Home visit completed

## 2022-02-11 NOTE — Telephone Encounter (Signed)
He's on a high dose of eplerenone.  Have him seen by APP in clinic to decide on stopping.

## 2022-02-11 NOTE — Telephone Encounter (Signed)
Heather w/paramedicine called to report pt c/o itchy and tender breast. Pt said its similar to when he was on spironolactone and thinks this may be related to his eplerenone Routed to [place]

## 2022-02-17 DIAGNOSIS — F411 Generalized anxiety disorder: Secondary | ICD-10-CM | POA: Diagnosis not present

## 2022-02-17 DIAGNOSIS — Z6841 Body Mass Index (BMI) 40.0 and over, adult: Secondary | ICD-10-CM | POA: Diagnosis not present

## 2022-02-17 DIAGNOSIS — F332 Major depressive disorder, recurrent severe without psychotic features: Secondary | ICD-10-CM | POA: Diagnosis not present

## 2022-02-17 DIAGNOSIS — F1120 Opioid dependence, uncomplicated: Secondary | ICD-10-CM | POA: Diagnosis not present

## 2022-02-18 ENCOUNTER — Telehealth (HOSPITAL_COMMUNITY): Payer: Self-pay | Admitting: Licensed Clinical Social Worker

## 2022-02-18 NOTE — Telephone Encounter (Signed)
HF Paramedicine Team Based Care Meeting  HF MD- NA  HF NP - [place] NP-C   [facility] admit within the last 30 days for heart failure? no  Medications concerns? Continues to report that he is not comfortable doing his own meds  Transportation issues ? Using insurance for transport  Education needs?   SDOH - no  Eligible for discharge? Think pt would be high likelihood to readmit without paramedicine follow up.  Jorge Ny, LCSW Clinical Social Worker [facility] Desk#: [phone] Cell#: [phone]

## 2022-02-20 NOTE — Progress Notes (Incomplete)
ADVANCED HF CLINIC NOTE  PCP: Enid Skeens., MD Primary Cardiologist: Dr. Berniece Salines, DO  HF Cardiology: Dr. [name]  HPI: 66 y.o. 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    He has had multiple hospitalizations in 2021 for CHF exacerbation. Echo 4/21 showed normal LVEF 55-60% w/ G1DD. RV not well visualized, but felt to have component of RV failure. [place] 4/21 showed normal PA pressure and normal cardiac output. PFTs also completed and c/w mild obstructive airway disease.   Coronary CTA 7/21 showed no coronary calcium but there was a concern for soft plaque in the distal LCx and distal RCA with hemodynamically significant stenosis.  He was managed medically. He has not had any chest pain, but was started on ranolazine after his CTA.  He feels like it makes his "head fuzzy."    Echo in 12/21 showed EF 20-94%, grade 2 diastolic dysfunction, RV poorly visualized.   Recurrent admissions for a/c diastolic CHF in 7096 requiring IV diuretic and metolazone.   RHC (8/22) showed normal filling pressures & he underwent Cardiomems placement.  Admitted 9/10-9/12/22 for acute encephalopathy, felt secondary to polypharmacy.   Eventually referred for Paramedicine to improve adherence.  He's had recurrent issues with lower extremity edema d/t venous insufficiency. Saw ID for suspected cellulitis. No active infection identified. Referral to lymphedema clinic submitted. Issues with compliance with UNNA boots/ compression stockings.  Admitted 3/23 with a/c dHF. Echo EF 60-65% RV normal. Hospitalization complicated by severe  hypokalemia, discharged home on KCL 100 tid, weight 377.7 lbs. Cardiomems goal lowered to 9.  Last seen for f/u in August. Continued to have significant dyspnea but volume stable on exam and dPAP was below goal.   Called the clinic 11/01 to complain of itchy and tender breast with  eplerenone. F/u recommended to evaluate.  Here today for f/u. Reviewed paramedicine notes. Still misses meds including metolazone at times because it makes him feel bad. May also eat high sodium meals and drink lots of fluids.  Cardiomems: PAD *** (goal 9).   ECG (personally reviewed): ***  PMH: 1. HTN 2. OSA 3. Hyperlipidemia 4. Low back pain 5. PE/DVT in 2/21.  - V/Q scan in 6/21 was not suggestive of chronic PE.  6. Nonalcoholic steatohepatitis.  7. GI bleed in 10/20.  8. Anxiety 9. CAD: Coronary CTA in 7/21 with calcium score 0 but concern for soft plaque distal CFX and RCA.  By [facility], there was flow-limiting stenosis in distal CFX and distal RCA.  10. Chronic diastolic CHF: Echo in 2/83 with EF 55-60%, RV poorly visualized.  - RHC (4/21): mean RA 8, PA 20/8, mean PCWP 11, CI 2.8 - Echo (12/21): EF 66-29%, grade 2 diastolic dysfunction, RV poorly visualized.   - RHC (8/22) normal filling pressures, RA mean 7, RV 29/9, PA 32/15, mean 22, PCWP mean 13, CO/CI 7.32/2.41. - Cardiomems (8/22) placement. - Echo (3/23): EF 60-65%, mild LVH, normal RV 11. PFTs (4/21): Mild obstruction.  12. ABIs (10/21): Normal.  Review of Systems: All systems reviewed and negative except as per HPI.   Current Outpatient Medications  Medication Sig Dispense Refill   apixaban (ELIQUIS) 5 MG TABS tablet Take 1 tablet (5 mg total) by mouth 2 (two) times daily. 60 tablet 3   Buprenorphine HCl-Naloxone HCl 8-2 MG FILM Place 1 tablet under the tongue 2 (two) times daily.     eplerenone (INSPRA) 50 MG tablet  TAKE 2 TABLETS BY MOUTH DAILY 60 tablet 6   gabapentin (NEURONTIN) 800 MG tablet Take 1 tablet (800 mg total) by mouth 3 (three) times daily. 90 tablet 1   lactulose, encephalopathy, (CHRONULAC) 10 GM/15ML SOLN Take 30 mLs (20 g total) by mouth 3 (three) times daily. 236 mL 0   metolazone (ZAROXOLYN) 2.5 MG tablet Take 1 tablet (2.5 mg total) by mouth 3 (three) times a week. Monday, Wednesday and Fridays  30 tablet 11   metoprolol succinate (TOPROL XL) 25 MG 24 hr tablet Take 1 tablet (25 mg total) by mouth at bedtime. 30 tablet 3   pantoprazole (PROTONIX) 40 MG tablet Take 40 mg by mouth 2 (two) times daily before a meal.     polyethylene glycol (MIRALAX / GLYCOLAX) 17 g packet Take 17 g by mouth as needed for mild constipation (mix and drink as directed).     potassium chloride SA (KLOR-CON M) 20 MEQ tablet Take 5 tablets (100 mEq total) by mouth 3 (three) times daily. Take one tablet extra (40 meq) with metolazone. 463 tablet 6   pravastatin (PRAVACHOL) 20 MG tablet Take 1 tablet (20 mg total) by mouth daily. 30 tablet 3   sertraline (ZOLOFT) 50 MG tablet Take 1 tablet (50 mg total) by mouth daily. 30 tablet 3   tamsulosin (FLOMAX) 0.4 MG CAPS capsule Take 1 capsule (0.4 mg total) by mouth daily. Additional refills will need to come from PCP 30 capsule 0   torsemide (DEMADEX) 100 MG tablet Take 1 tablet (100 mg total) by mouth twice daily. 60 tablet 3   triamcinolone (KENALOG) 0.025 % ointment Apply 1 application. topically 2 (two) times daily. 30 g 0   VENTOLIN HFA 108 (90 Base) MCG/ACT inhaler Inhale 1 puff into the lungs every 4 (four) hours as needed for shortness of breath. 6.7 g 1   zolpidem (AMBIEN) 10 MG tablet Take 1 tablet (10 mg total) by mouth at bedtime as needed for sleep. 30 tablet 0   No current facility-administered medications for this visit.   Allergies  Allergen Reactions   Penicillins Other (See Comments)    Caused convulsions as a child  Did it involve swelling of the face/tongue/throat, SOB, or low BP? N/A Did it involve sudden or severe rash/hives, skin peeling, or any reaction on the inside of your mouth or nose? N/A Did you need to seek medical attention at a hospital or doctor's office?N/A When did it last happen? N/A If all above answers are "NO", may proceed with cephalosporin use.   Social History   Socioeconomic History   Marital status: Single    Spouse  name: Not on file   Number of children: Not on file   Years of education: Not on file   Highest education level: Not on file  Occupational History   Not on file  Tobacco Use   Smoking status: Never   Smokeless tobacco: Never  Vaping Use   Vaping Use: Never used  Substance and Sexual Activity   Alcohol use: Not Currently   Drug use: Not Currently    Types: Marijuana    Comment: Smoked for 30 years   Sexual activity: Not on file  Other Topics Concern   Not on file  Social History Narrative   Not on file   Social Determinants of Health   Financial Resource Strain: Medium Risk (11/15/2020)   Overall Financial Resource Strain (CARDIA)    Difficulty of Paying Living Expenses: Somewhat hard  Food  Insecurity: No Food Insecurity (11/15/2020)   Hunger Vital Sign    Worried About Running Out of Food in the Last Year: Never true    Ran Out of Food in the Last Year: Never true  Transportation Needs: Unmet Transportation Needs (11/15/2020)   PRAPARE - Hydrologist (Medical): Yes    Lack of Transportation (Non-Medical): No  Physical Activity: Not on file  Stress: Not on file  Social Connections: Not on file  Intimate Partner Violence: Not on file    Family History  Problem Relation Age of Onset   Hyperlipidemia Mother    Hypertension Mother    Stroke Mother    CAD Maternal Grandmother    CAD Maternal Grandfather    There were no vitals taken for this visit.  Wt Readings from Last 3 Encounters:  02/10/22 (!) 177.8 kg (392 lb)  01/27/22 (!) 186.4 kg (411 lb)  01/20/22 (!) 182.2 kg (401 lb 9.6 oz)   PHYSICAL EXAM: General:  NAD. No resp difficulty HEENT: Normal Neck: Supple. Thick neck Carotids 2+ bilat; no bruits. No lymphadenopathy or thryomegaly appreciated. Cor: PMI nondisplaced. Regular rate & rhythm. No rubs, gallops or murmurs. Lungs: Clear, left upper chest TTP Abdomen: Obese, soft, nontender, nondistended. No hepatosplenomegaly. No bruits or  masses. Good bowel sounds. Extremities: No cyanosis, clubbing, rash, 2-3+ pre-tibial BLE edema; chronic BLE erythema Neuro: Alert & oriented x 3, cranial nerves grossly intact. Moves all 4 extremities w/o difficulty. Flat affect..  ASSESSMENT & PLAN: 1. Chronic Diastolic CHF with suspected RV dysfunction: Echo 7/22 with EF 60-65%, mild LVH, normal RV, unable to estimate PA systolic pressure. Multiple CHF admissions despite aggressive outpatient diuretic regimen and paramedicine.  RHC (7/22) with optimized filling pressures, PCWP 13, CO/CI 7.3/2.4. Received IV lasix 80 mg through paramedicine 11/12/20. RHC (8/22) with normal filling pressures; underwent placement of Cardiomems (dPAP goal 9 mmHg). Echo 3/23 EF 60-65% RV normal this admission.   -NYHA III symptoms (chronic), functional class difficult due to body habitus and physical inactivity.  - Continue torsemide 100 mg bid + 2.5 mg metolazone M, W, F.    - Continue KCl 100 mEq tid, extra 20 on metolazone days. - c/w unna boots, HH nurse to place  - Continue eplerenone 100 mg daily.  - Off Jardiance (GI upset). - Continue Toprol XL 25 mg daily.  2. H/o PE/DVT: Suspect due to sedentary lifestyle.  Occurred in 2/21.  V/Q scan in 6/21 did not show evidence for chronic PE.  CT scan this admit limited because of suboptimal opacification and artifact, however is suggestive of bilateral lower lobe segmental and subsegmental pulm emboli but no evidence of Rt heart strain but not certain of diagnosis due to quality of scan. Unable to get V/Q scan  due to size. LE venous dopplers negative for DVT. - Continue Eliquis.  3. OSA: Cannot tolerate CPAP. 4. Anxiety/depression: This is a major issue.   - Continue sertraline 50 mg daily.  5. CAD: Cath 1/22 with no significant CAD. Now with atypical CP. ECG w/o ST-T changes. Patient Left upper chest TTP, he has left shoulder pain with movement. Likely MSK. - Discussed starting Imdur, +/- LHC. He does not want a cath  & he is worried about side effect of Imdur. - Continue statin  6. Hypertension: Stable. Continue current meds. 7. Atrial fibrillation: Paroxysmal. In NSR today.  - Continue apixaban.  - Continue Toprol XL. 8. Bilateral Lower extremity erythema: Admitted 5/22 with possible  BLE cellulitis. Since then, he has been on multiple rounds of abx. He continues w/ b/l LE erythema on exam but suspect most likely due to chronic venous stasis/ dependent rubor + CHF. Doubt cellulitis.  - Continue Unna boots, as above. We discussed he needs to pick up his custom compression hose to use after Unna boots reduce swelling. 9. Cirrhosis: CT scan noted nodular contour of liver, compatible with underlying cirrhosis. In setting of known RV dysfunction. LFTs ok. EGD 12/22 showed Portal hypertensive gastropathy. No varices.  - On lactulose (also takes this for chronic constipation)  - On eplerenone + ? blocker  10. Anemia: Stable CBC.  11. Obesity: Major issue. There is no height or weight on file to calculate BMI. - He has been referred to pharmacy clinic for semaglutide and [facility] clinic. 12. H/o Polymorphic VT: brief, in setting of hypokalemia. - Check labs today.  Follow-up: [facility], Royer Cristobal N PA-C 02/20/2022

## 2022-02-23 ENCOUNTER — Telehealth (HOSPITAL_COMMUNITY): Payer: Self-pay

## 2022-02-23 ENCOUNTER — Telehealth (HOSPITAL_COMMUNITY): Payer: Self-pay | Admitting: Licensed Clinical Social Worker

## 2022-02-23 ENCOUNTER — Telehealth: Payer: Self-pay

## 2022-02-23 ENCOUNTER — Encounter (HOSPITAL_COMMUNITY): Payer: Medicare HMO

## 2022-02-23 DIAGNOSIS — I5032 Chronic diastolic (congestive) heart failure: Secondary | ICD-10-CM

## 2022-02-23 NOTE — Telephone Encounter (Signed)
H&V Care Navigation CSW Progress Note  Clinical Social Worker contacted by [employer] to try and assist pt in making it to clinic for appt this morning.  Patient reports that an aid had helped him set up a ride but that he found out this morning they weren't coming.  CSW set up uber and called to tell pt about it but then he stated he couldn't safely make it down the stairs of his apartment complex on his own.  CSW canceled uber for safety reasons and paramedic had pt rescheduled to later date.  CSW placed order to [employer] for assistance in setting up a safe ride for pt to make it to future appt.   SDOH Screenings   Food Insecurity: No Food Insecurity (11/15/2020)  Housing: Low Risk  (07/17/2021)  Transportation Needs: Unmet Transportation Needs (02/23/2022)  Alcohol Screen: Low Risk  (06/11/2020)  Depression (PHQ2-9): Medium Risk (06/11/2020)  Financial Resource Strain: Medium Risk (11/15/2020)  Tobacco Use: Low Risk  (12/10/2021)    Jorge Ny, LCSW Clinical Social Worker [facility] Desk#: [phone] Cell#: [phone]

## 2022-02-23 NOTE — Telephone Encounter (Signed)
Brand called me this morning reporting that [facility] transportation told him they could not staff a driver to get him to his appointment today. I called Tammy Sours LCSW for assistance and she set up an Melburn Popper however Mr. Geister reported he needs door to door assistance due to him being n a second story apartment and feels unable to travel down the stairs independently. I advised Eliezer Lofts of same, Melburn Popper was candled and appointment was rescheduled. Samad aware of same- next HF clinic appointment made for Dec. 12 at 0900.   [name], [place] 02/23/2022

## 2022-02-23 NOTE — Telephone Encounter (Addendum)
   Telephone encounter was:  Successful.  02/23/2022 Name: Charles Gray MRN: [id number] DOB: Aug 13, 1955  Charles Gray is a 66 y.o. year old male who is a primary care patient of [place], Marshall Cork., MD . The community resource team was consulted for assistance with Transportation Needs   Care guide performed the following interventions: -Spoke with patient's CNA Joycelyn Schmid with his permission.  Patient is aware that he has a transportation benefit with [employer] but stated that they are not reliable.  I informed Joycelyn Schmid that [facility] usually assist patient's that do not have a transportation benefit and I would have to check with my manager to see if we can assist and call her back. I suggested [facility] transportation but patient stated they are unable to assist.  Follow Up Plan:   I will call the patient's CNA Joycelyn Schmid back tomorrow.  [place] Resource Care Guide   [name]  ?? [phone]   Website: [email or web address]  [email or web address]

## 2022-02-24 ENCOUNTER — Telehealth: Payer: Self-pay

## 2022-02-24 NOTE — Telephone Encounter (Signed)
   Telephone encounter was:  Successful.  02/24/2022 Name: Charles Gray MRN: [id number] DOB: 02-13-1956  Charles Gray is a 66 y.o. year old male who is a primary care patient of Slatosky, Marshall Cork., MD . The community resource team was consulted for assistance with Transportation Needs   Care guide performed the following interventions: Spoke with patient's CNA Joycelyn Schmid. Read the transportation [employer] and Release of Liability form to both the patient and the CNA. Scheduled transportation for 03/24/22 9am appointment on [employer].  Follow Up Plan:   I will update the patient when I receive confirmation from [place].  [place] Resource Care Guide   [email or web address]  ?? [phone]   Website: [email or web address]  [email or web address]

## 2022-02-25 ENCOUNTER — Other Ambulatory Visit (HOSPITAL_COMMUNITY): Payer: Self-pay

## 2022-02-25 ENCOUNTER — Telehealth: Payer: Self-pay

## 2022-02-25 ENCOUNTER — Telehealth (HOSPITAL_COMMUNITY): Payer: Self-pay

## 2022-02-25 NOTE — Telephone Encounter (Signed)
error 

## 2022-02-25 NOTE — Telephone Encounter (Signed)
   Telephone encounter was:  Unsuccessful.  02/25/2022 Name: Nikolai Wilczak MRN: [id number] DOB: 1955/07/29  Unsuccessful outbound call made today to assist with:  Transportation Needs   Outreach Attempt:  2nd Attempt  A HIPAA compliant voice message was left requesting a return call.  Instructed patient to call back at [phone]. Left message on voicemail to inform patient of 8:18am pickup time on 03/24/22 appointment [facility].   [place] Resource Care Guide   [email or web address]  ?? [phone]   Website: [email or web address]  [email or web address]

## 2022-02-25 NOTE — Progress Notes (Signed)
Paramedicine Encounter    Patient ID: Charles Gray, male    DOB: 15-Sep-1955, 66 y.o.   MRN: [id number]  Arrived for home visit for Charles Gray who reports he is not feeling well today. He is seated in his recliner- lower legs noted to be swollen as normal but no more than normal. He is not short of breath while seated but is on exertion with movement and walking to the bathroom. He has to stop to rest after about 8 feet of walking. He is not exercising regularly and admits to drinking excessive fluids and eating salty foods and snacks. He reports he has not missed any doses of medications. Pill boxes empty indicating compliance. He denied missing metolazone doses as he is known for skipping them.   I reviewed meds and confirmed same, filling pill box for two weeks.   Cardiomems today is 10. His goal is 9. Today is a metolazone day. His weight is up 15lbs from our last visit 15 days ago. Lungs clear. Vitals obtained. He has not yet taken medications at time of visit.   WT- 407lbs BP- 160/82 HR- 82 O2- 98% RR- 18    I stressed the importance of maintaining adherence to his diet and fluid restrictions and trying to move and exercise daily. He verbalized understanding. I explained the risks if he does not adhere to these things and how it can negatively effect his health.  He is aware if symptoms worsen to seek further medical attention and to reach out to me. I plan to follow up daily on cardio mems readings and weight check ins and home visit in two weeks.   Refills: Torsemide Pantoprazole  [place], EMT-Paramedic [phone] 02/25/2022     Patient Care Team: Enid Skeens., MD as PCP - General (Family Medicine) Berniece Salines, DO as PCP - Cardiology (Cardiology) Jorge Ny, LCSW as Social Worker (Licensed Clinical Social Worker)  Patient Active Problem List   Diagnosis Date Noted   Dyslipidemia 07/17/2021   Lymphedema 07/08/2021   Melena    Gastric polyps     UGIB (upper gastrointestinal bleed) 04/06/2021   Acute encephalopathy 12/21/2020   COPD with acute exacerbation ([place]) 12/21/2020   Chronic diastolic (congestive) heart failure ([place]) 11/14/2020   Syncope 10/28/2020   CHF (congestive heart failure) ([place]) 10/28/2020   Chronic venous stasis dermatitis of both lower extremities 08/27/2020   Cellulitis of both lower extremities 08/14/2020   Iron deficiency anemia 08/14/2020   GERD without esophagitis 06/10/2020   Atrial fibrillation with rapid ventricular response ([place]) 03/24/2020   Acute on chronic right heart failure ([place]) 01/27/2020   Acute respiratory failure with hypoxia (HCC)    Fatty liver disease, nonalcoholic    Shock ([place])    Personal history of DVT (deep vein thrombosis) 12/26/2019   Chronic anticoagulation 12/26/2019   Palpitations 12/26/2019   Paroxysmal atrial fibrillation ([place]) 12/23/2019   Heart failure, systolic, acute ([place]) 77/93/9030   Acute on chronic congestive heart failure (HCC)    Acute on chronic diastolic CHF (congestive heart failure) ([place]) 08/23/2019   Chest pain 08/23/2019   Lactic acidosis 08/23/2019   History of pulmonary embolism 08/12/2019   Atrial tachycardia 08/12/2019   Occasional tremors 08/12/2019   OSA (obstructive sleep apnea) 08/12/2019   AKI (acute kidney injury) ([place]) 08/08/2019   Acute on chronic right-sided heart failure ([place]) 08/06/2019   Acute pulmonary embolism ([place]) 06/08/2019   Normocytic anemia 06/08/2019   Pulmonary emboli ([place]) 06/08/2019   Chronic  heart failure with preserved ejection fraction ([place]) 05/30/2019   Class 2 severe obesity due to excess calories with serious comorbidity and body mass index (BMI) of 39.0 to 39.9 in adult [facility]) 05/30/2019   Morbid obesity with BMI of 40.0-44.9, adult ([name]) 05/30/2019   Hyponatremia    Drug induced constipation    Peripheral edema    Hypotension due to drugs    Hypoalbuminemia due to protein-calorie malnutrition (HCC)     Thrombocytopenia (HCC)    Anemia of chronic disease    Cirrhosis of liver without ascites (HCC)    Chronic pain syndrome    Debility 03/13/2019   Portal hypertensive gastropathy ([place]) 03/06/2019   Dyspnea    GIB (gastrointestinal bleeding) 03/04/2019   Mixed hyperlipidemia 01/24/2019   Insomnia 01/24/2019   Hyperlipidemia 01/24/2019   Essential hypertension 01/24/2019   Lumbar disc herniation 01/24/2019    Current Outpatient Medications:    apixaban (ELIQUIS) 5 MG TABS tablet, Take 1 tablet (5 mg total) by mouth 2 (two) times daily., Disp: 60 tablet, Rfl: 3   Buprenorphine HCl-Naloxone HCl 8-2 MG FILM, Place 1 tablet under the tongue 2 (two) times daily., Disp: , Rfl:    eplerenone (INSPRA) 50 MG tablet, TAKE 2 TABLETS BY MOUTH DAILY, Disp: 60 tablet, Rfl: 6   gabapentin (NEURONTIN) 800 MG tablet, Take 1 tablet (800 mg total) by mouth 3 (three) times daily., Disp: 90 tablet, Rfl: 1   lactulose, encephalopathy, (CHRONULAC) 10 GM/15ML SOLN, Take 30 mLs (20 g total) by mouth 3 (three) times daily., Disp: 236 mL, Rfl: 0   metolazone (ZAROXOLYN) 2.5 MG tablet, Take 1 tablet (2.5 mg total) by mouth 3 (three) times a week. Monday, Wednesday and Fridays, Disp: 30 tablet, Rfl: 11   metoprolol succinate (TOPROL XL) 25 MG 24 hr tablet, Take 1 tablet (25 mg total) by mouth at bedtime., Disp: 30 tablet, Rfl: 3   pantoprazole (PROTONIX) 40 MG tablet, Take 40 mg by mouth 2 (two) times daily before a meal., Disp: , Rfl:    polyethylene glycol (MIRALAX / GLYCOLAX) 17 g packet, Take 17 g by mouth as needed for mild constipation (mix and drink as directed)., Disp: , Rfl:    potassium chloride SA (KLOR-CON M) 20 MEQ tablet, Take 5 tablets (100 mEq total) by mouth 3 (three) times daily. Take one tablet extra (40 meq) with metolazone., Disp: 463 tablet, Rfl: 6   pravastatin (PRAVACHOL) 20 MG tablet, Take 1 tablet (20 mg total) by mouth daily., Disp: 30 tablet, Rfl: 3   sertraline (ZOLOFT) 50 MG tablet, Take 1  tablet (50 mg total) by mouth daily., Disp: 30 tablet, Rfl: 3   tamsulosin (FLOMAX) 0.4 MG CAPS capsule, Take 1 capsule (0.4 mg total) by mouth daily. Additional refills will need to come from PCP, Disp: 30 capsule, Rfl: 0   torsemide (DEMADEX) 100 MG tablet, Take 1 tablet (100 mg total) by mouth twice daily., Disp: 60 tablet, Rfl: 3   triamcinolone (KENALOG) 0.025 % ointment, Apply 1 application. topically 2 (two) times daily., Disp: 30 g, Rfl: 0   VENTOLIN HFA 108 (90 Base) MCG/ACT inhaler, Inhale 1 puff into the lungs every 4 (four) hours as needed for shortness of breath., Disp: 6.7 g, Rfl: 1   zolpidem (AMBIEN) 10 MG tablet, Take 1 tablet (10 mg total) by mouth at bedtime as needed for sleep., Disp: 30 tablet, Rfl: 0 Allergies  Allergen Reactions   Penicillins Other (See Comments)    Caused convulsions as a  child  Did it involve swelling of the face/tongue/throat, SOB, or low BP? N/A Did it involve sudden or severe rash/hives, skin peeling, or any reaction on the inside of your mouth or nose? N/A Did you need to seek medical attention at a hospital or doctor's office?N/A When did it last happen? N/A If all above answers are "NO", may proceed with cephalosporin use.     Social History   Socioeconomic History   Marital status: Single    Spouse name: Not on file   Number of children: Not on file   Years of education: Not on file   Highest education level: Not on file  Occupational History   Not on file  Tobacco Use   Smoking status: Never   Smokeless tobacco: Never  Vaping Use   Vaping Use: Never used  Substance and Sexual Activity   Alcohol use: Not Currently   Drug use: Not Currently    Types: Marijuana    Comment: Smoked for 30 years   Sexual activity: Not on file  Other Topics Concern   Not on file  Social History Narrative   Not on file   Social Determinants of Health   Financial Resource Strain: Medium Risk (11/15/2020)   Overall Financial Resource Strain (CARDIA)     Difficulty of Paying Living Expenses: Somewhat hard  Food Insecurity: No Food Insecurity (11/15/2020)   Hunger Vital Sign    Worried About Running Out of Food in the Last Year: Never true    Ran Out of Food in the Last Year: Never true  Transportation Needs: Unmet Transportation Needs (02/23/2022)   PRAPARE - Hydrologist (Medical): Yes    Lack of Transportation (Non-Medical): Yes  Physical Activity: Not on file  Stress: Not on file  Social Connections: Not on file  Intimate Partner Violence: Not on file    Physical Exam      Future Appointments  Date Time Provider [place]  03/24/2022  9:00 AM [facility] PA/NP [facility] None  04/21/2022  3:45 PM [place], Nena Alexander, DPM [facility]     ACTION: Home visit completed

## 2022-02-25 NOTE — Telephone Encounter (Signed)
   Telephone encounter was:  Successful.  02/25/2022 Name: Charles Gray MRN: [id number] DOB: 12/24/1955  Charles Gray is a 66 y.o. year old male who is a primary care patient of Slatosky, Marshall Cork., MD . The community resource team was consulted for assistance with Transportation Needs   Care guide performed the following interventions: Spoke with patient and his CNA Joycelyn Schmid about his pickup time of 8:18am on 03/24/22 for [facility]. Also gave the number to call if he needs to cancel [phone].  Follow Up Plan:  No further follow up planned at this time. The patient has been provided with needed resources.  [place] Resource Care Guide   [email or web address]  ?? [phone]   Website: [email or web address]  [email or web address]

## 2022-02-25 NOTE — Telephone Encounter (Signed)
02/25/2022  Charles Gray DOB: 28-Dec-1955 MRN: [id number]   RIDER WAIVER AND RELEASE OF LIABILITY  For the purposes of helping with transportation needs, [facility] partners with outside transportation providers (taxi companies, [place], Social research officer, government.) to give [employer] patients or other approved people the choice of on-demand rides [employer]") to our buildings for non-emergency visits.  By using [employer], I, the person signing this document, on behalf of myself and/or any legal minors (in my care using the [employer]), agree:  Government social research officer given to me are supplied by independent, outside transportation providers who do not work for, or have any affiliation with, [employer]. [place] is not a transportation company. [place] has no control over the quality or safety of the rides I get using [employer]. [facility] has no control over whether any outside ride will happen on time or not. [facility] gives no guarantee on the reliability, quality, safety, or availability on any rides, or that no mistakes will happen. I know and accept that traveling by vehicle (car, truck, SVU, Lucianne Lei, bus, taxi, etc.) has risks of serious injuries such as disability, being paralyzed, and death. I know and agree the risk of using [employer] is mine alone, and not [employer]. Transport Services are provided "as is" and as are available. The transportation providers are in charge for all inspections and care of the vehicles used to provide these rides. I agree not to take legal action against [place], its agents, employees, officers, directors, representatives, insurers, attorneys, assigns, successors, subsidiaries, and affiliates at any time for any reasons related directly or indirectly to using [employer]. I also agree not to take legal action against [place] or its affiliates for any injury, death, or damage to property caused by or related to using  [employer]. I have read this Waiver and Release of Liability, and I understand the terms used in it and their legal meaning. This Waiver is freely and voluntarily given with the understanding that my right (or any legal minors) to legal action against [place] relating to [employer] is knowingly given up to use these services.   I attest that I read the Ride Waiver and Release of Liability to Charles Gray, gave Mr. Meldrum the opportunity to ask questions and answered the questions asked (if any). I affirm that Riyaan Heroux then provided consent for assistance with transportation.     Erastus Bartolomei D Andree Elk

## 2022-03-04 ENCOUNTER — Inpatient Hospital Stay (HOSPITAL_COMMUNITY)
Admission: EM | Admit: 2022-03-04 | Discharge: 2022-03-11 | DRG: 291 | Disposition: A | Discharge status: Home Health | Payer: Medicare HMO | Attending: Internal Medicine | Admitting: Internal Medicine

## 2022-03-04 ENCOUNTER — Emergency Department (HOSPITAL_COMMUNITY): Payer: Medicare HMO

## 2022-03-04 ENCOUNTER — Other Ambulatory Visit: Payer: Self-pay

## 2022-03-04 ENCOUNTER — Telehealth (HOSPITAL_COMMUNITY): Payer: Self-pay

## 2022-03-04 DIAGNOSIS — I110 Hypertensive heart disease with heart failure: Secondary | ICD-10-CM | POA: Diagnosis not present

## 2022-03-04 DIAGNOSIS — I5031 Acute diastolic (congestive) heart failure: Secondary | ICD-10-CM | POA: Diagnosis not present

## 2022-03-04 DIAGNOSIS — E876 Hypokalemia: Secondary | ICD-10-CM | POA: Diagnosis present

## 2022-03-04 DIAGNOSIS — Z8249 Family history of ischemic heart disease and other diseases of the circulatory system: Secondary | ICD-10-CM

## 2022-03-04 DIAGNOSIS — Z1152 Encounter for screening for COVID-19: Secondary | ICD-10-CM

## 2022-03-04 DIAGNOSIS — R5381 Other malaise: Secondary | ICD-10-CM | POA: Diagnosis not present

## 2022-03-04 DIAGNOSIS — I878 Other specified disorders of veins: Secondary | ICD-10-CM | POA: Diagnosis present

## 2022-03-04 DIAGNOSIS — R079 Chest pain, unspecified: Secondary | ICD-10-CM | POA: Diagnosis not present

## 2022-03-04 DIAGNOSIS — K5909 Other constipation: Secondary | ICD-10-CM | POA: Diagnosis present

## 2022-03-04 DIAGNOSIS — J439 Emphysema, unspecified: Secondary | ICD-10-CM | POA: Diagnosis present

## 2022-03-04 DIAGNOSIS — I872 Venous insufficiency (chronic) (peripheral): Secondary | ICD-10-CM | POA: Diagnosis present

## 2022-03-04 DIAGNOSIS — I5033 Acute on chronic diastolic (congestive) heart failure: Secondary | ICD-10-CM | POA: Diagnosis present

## 2022-03-04 DIAGNOSIS — I480 Paroxysmal atrial fibrillation: Secondary | ICD-10-CM | POA: Diagnosis present

## 2022-03-04 DIAGNOSIS — Z86718 Personal history of other venous thrombosis and embolism: Secondary | ICD-10-CM | POA: Diagnosis not present

## 2022-03-04 DIAGNOSIS — G4733 Obstructive sleep apnea (adult) (pediatric): Secondary | ICD-10-CM | POA: Diagnosis present

## 2022-03-04 DIAGNOSIS — Z83438 Family history of other disorder of lipoprotein metabolism and other lipidemia: Secondary | ICD-10-CM

## 2022-03-04 DIAGNOSIS — R531 Weakness: Secondary | ICD-10-CM | POA: Diagnosis not present

## 2022-03-04 DIAGNOSIS — D509 Iron deficiency anemia, unspecified: Secondary | ICD-10-CM | POA: Diagnosis not present

## 2022-03-04 DIAGNOSIS — Z6841 Body Mass Index (BMI) 40.0 and over, adult: Secondary | ICD-10-CM

## 2022-03-04 DIAGNOSIS — Z880 Allergy status to penicillin: Secondary | ICD-10-CM

## 2022-03-04 DIAGNOSIS — Z7901 Long term (current) use of anticoagulants: Secondary | ICD-10-CM

## 2022-03-04 DIAGNOSIS — K7460 Unspecified cirrhosis of liver: Secondary | ICD-10-CM | POA: Diagnosis present

## 2022-03-04 DIAGNOSIS — J449 Chronic obstructive pulmonary disease, unspecified: Secondary | ICD-10-CM | POA: Diagnosis not present

## 2022-03-04 DIAGNOSIS — I10 Essential (primary) hypertension: Secondary | ICD-10-CM | POA: Diagnosis present

## 2022-03-04 DIAGNOSIS — R609 Edema, unspecified: Secondary | ICD-10-CM

## 2022-03-04 DIAGNOSIS — F419 Anxiety disorder, unspecified: Secondary | ICD-10-CM | POA: Diagnosis present

## 2022-03-04 DIAGNOSIS — M109 Gout, unspecified: Secondary | ICD-10-CM | POA: Diagnosis present

## 2022-03-04 DIAGNOSIS — I959 Hypotension, unspecified: Secondary | ICD-10-CM | POA: Diagnosis not present

## 2022-03-04 DIAGNOSIS — E782 Mixed hyperlipidemia: Secondary | ICD-10-CM | POA: Diagnosis present

## 2022-03-04 DIAGNOSIS — G894 Chronic pain syndrome: Secondary | ICD-10-CM | POA: Diagnosis present

## 2022-03-04 DIAGNOSIS — I5082 Biventricular heart failure: Secondary | ICD-10-CM | POA: Diagnosis present

## 2022-03-04 DIAGNOSIS — Z86711 Personal history of pulmonary embolism: Secondary | ICD-10-CM

## 2022-03-04 DIAGNOSIS — I5032 Chronic diastolic (congestive) heart failure: Secondary | ICD-10-CM

## 2022-03-04 DIAGNOSIS — R188 Other ascites: Secondary | ICD-10-CM | POA: Diagnosis not present

## 2022-03-04 DIAGNOSIS — R0602 Shortness of breath: Secondary | ICD-10-CM | POA: Diagnosis not present

## 2022-03-04 DIAGNOSIS — Z79899 Other long term (current) drug therapy: Secondary | ICD-10-CM

## 2022-03-04 DIAGNOSIS — F32A Depression, unspecified: Secondary | ICD-10-CM | POA: Diagnosis present

## 2022-03-04 DIAGNOSIS — D638 Anemia in other chronic diseases classified elsewhere: Secondary | ICD-10-CM | POA: Diagnosis present

## 2022-03-04 DIAGNOSIS — R0600 Dyspnea, unspecified: Secondary | ICD-10-CM

## 2022-03-04 DIAGNOSIS — Z7401 Bed confinement status: Secondary | ICD-10-CM | POA: Diagnosis not present

## 2022-03-04 LAB — CBC WITH DIFFERENTIAL/PLATELET
Abs Immature Granulocytes: 0.04 10*3/uL (ref 0.00–0.07)
Basophils Absolute: 0 10*3/uL (ref 0.0–0.1)
Basophils Relative: 0 %
Eosinophils Absolute: 0.1 10*3/uL (ref 0.0–0.5)
Eosinophils Relative: 1 %
HCT: 33.4 % — ABNORMAL LOW (ref 39.0–52.0)
Hemoglobin: 9.4 g/dL — ABNORMAL LOW (ref 13.0–17.0)
Immature Granulocytes: 1 %
Lymphocytes Relative: 8 %
Lymphs Abs: 0.5 10*3/uL — ABNORMAL LOW (ref 0.7–4.0)
MCH: 21 pg — ABNORMAL LOW (ref 26.0–34.0)
MCHC: 28.1 g/dL — ABNORMAL LOW (ref 30.0–36.0)
MCV: 74.6 fL — ABNORMAL LOW (ref 80.0–100.0)
Monocytes Absolute: 0.4 10*3/uL (ref 0.1–1.0)
Monocytes Relative: 7 %
Neutro Abs: 5.4 10*3/uL (ref 1.7–7.7)
Neutrophils Relative %: 83 %
Platelets: 137 10*3/uL — ABNORMAL LOW (ref 150–400)
RBC: 4.48 MIL/uL (ref 4.22–5.81)
RDW: 17.8 % — ABNORMAL HIGH (ref 11.5–15.5)
WBC: 6.5 10*3/uL (ref 4.0–10.5)
nRBC: 0 % (ref 0.0–0.2)

## 2022-03-04 LAB — COMPREHENSIVE METABOLIC PANEL
ALT: 19 U/L (ref 0–44)
AST: 50 U/L — ABNORMAL HIGH (ref 15–41)
Albumin: 3.3 g/dL — ABNORMAL LOW (ref 3.5–5.0)
Alkaline Phosphatase: 88 U/L (ref 38–126)
Anion gap: 15 (ref 5–15)
BUN: 13 mg/dL (ref 8–23)
CO2: 26 mmol/L (ref 22–32)
Calcium: 9.2 mg/dL (ref 8.9–10.3)
Chloride: 97 mmol/L — ABNORMAL LOW (ref 98–111)
Creatinine, Ser: 0.96 mg/dL (ref 0.61–1.24)
GFR, Estimated: >60 mL/min (ref >60)
Glucose, Bld: 136 mg/dL — ABNORMAL HIGH (ref 70–99)
Potassium: 3.9 mmol/L (ref 3.5–5.1)
Sodium: 138 mmol/L (ref 135–145)
Total Bilirubin: 1.1 mg/dL (ref 0.3–1.2)
Total Protein: 7.4 g/dL (ref 6.5–8.1)

## 2022-03-04 LAB — URINALYSIS, ROUTINE W REFLEX MICROSCOPIC
Bilirubin Urine: NEGATIVE
Glucose, UA: NEGATIVE mg/dL
Hgb urine dipstick: NEGATIVE
Ketones, ur: NEGATIVE mg/dL
Leukocytes,Ua: NEGATIVE
Nitrite: NEGATIVE
Protein, ur: NEGATIVE mg/dL
Specific Gravity, Urine: 1.01 (ref 1.005–1.030)
pH: 8 (ref 5.0–8.0)

## 2022-03-04 LAB — TROPONIN I (HIGH SENSITIVITY)
Troponin I (High Sensitivity): 6 ng/L (ref <18)
Troponin I (High Sensitivity): 9 ng/L (ref <18)

## 2022-03-04 LAB — RESP PANEL BY RT-PCR (FLU A&B, COVID) ARPGX2
Influenza A by PCR: NEGATIVE
Influenza B by PCR: NEGATIVE
SARS Coronavirus 2 by RT PCR: NEGATIVE

## 2022-03-04 LAB — BRAIN NATRIURETIC PEPTIDE: B Natriuretic Peptide: 60.9 pg/mL (ref 0.0–100.0)

## 2022-03-04 MED ORDER — LACTULOSE 10 GM/15ML PO SOLN
33.3300 g | Freq: Two times a day (BID) | ORAL | Status: DC
  Administered 2022-03-05 – 2022-03-07 (×2): 33.33 g via ORAL
  Administered 2022-03-09: 20 g via ORAL
  Administered 2022-03-10 – 2022-03-11 (×3): 33.33 g via ORAL
  Filled 2022-03-04 (×3): qty 60
  Filled 2022-03-04: qty 50
  Filled 2022-03-04 (×7): qty 60

## 2022-03-04 MED ORDER — METOPROLOL SUCCINATE ER 25 MG PO TB24
25.0000 mg | ORAL_TABLET | Freq: Every day | ORAL | Status: DC
  Administered 2022-03-04 – 2022-03-10 (×7): 25 mg via ORAL
  Filled 2022-03-04 (×7): qty 1

## 2022-03-04 MED ORDER — ALBUTEROL SULFATE (2.5 MG/3ML) 0.083% IN NEBU: 2.5000 mg | INHALATION_SOLUTION | RESPIRATORY_TRACT | Status: DC | PRN

## 2022-03-04 MED ORDER — PRAVASTATIN SODIUM 10 MG PO TABS
20.0000 mg | ORAL_TABLET | Freq: Every day | ORAL | Status: DC
  Administered 2022-03-04 – 2022-03-10 (×7): 20 mg via ORAL
  Filled 2022-03-04 (×7): qty 2

## 2022-03-04 MED ORDER — SODIUM CHLORIDE 0.9% FLUSH
3.0000 mL | Freq: Two times a day (BID) | INTRAVENOUS | Status: DC
  Administered 2022-03-04 – 2022-03-11 (×12): 3 mL via INTRAVENOUS

## 2022-03-04 MED ORDER — ONDANSETRON HCL 4 MG/2ML IJ SOLN
4.0000 mg | Freq: Four times a day (QID) | INTRAMUSCULAR | Status: DC | PRN
  Administered 2022-03-05: 4 mg via INTRAVENOUS
  Filled 2022-03-04: qty 2

## 2022-03-04 MED ORDER — FUROSEMIDE 10 MG/ML IJ SOLN
80.0000 mg | Freq: Two times a day (BID) | INTRAMUSCULAR | Status: DC
  Administered 2022-03-05 – 2022-03-06 (×3): 80 mg via INTRAVENOUS
  Filled 2022-03-04 (×3): qty 8

## 2022-03-04 MED ORDER — TAMSULOSIN HCL 0.4 MG PO CAPS
0.4000 mg | ORAL_CAPSULE | Freq: Every day | ORAL | Status: DC
  Administered 2022-03-05 – 2022-03-11 (×7): 0.4 mg via ORAL
  Filled 2022-03-04 (×7): qty 1

## 2022-03-04 MED ORDER — ONDANSETRON HCL 4 MG PO TABS
4.0000 mg | ORAL_TABLET | Freq: Four times a day (QID) | ORAL | Status: DC | PRN
  Administered 2022-03-05 – 2022-03-09 (×4): 4 mg via ORAL
  Filled 2022-03-04 (×4): qty 1

## 2022-03-04 MED ORDER — ALBUTEROL SULFATE HFA 108 (90 BASE) MCG/ACT IN AERS: 2.0000 | INHALATION_SPRAY | RESPIRATORY_TRACT | Status: DC | PRN

## 2022-03-04 MED ORDER — ZOLPIDEM TARTRATE 5 MG PO TABS
5.0000 mg | ORAL_TABLET | Freq: Every evening | ORAL | Status: DC | PRN
  Administered 2022-03-04 – 2022-03-05 (×2): 5 mg via ORAL
  Filled 2022-03-04 (×2): qty 1

## 2022-03-04 MED ORDER — ACETAMINOPHEN 325 MG PO TABS
650.0000 mg | ORAL_TABLET | Freq: Four times a day (QID) | ORAL | Status: DC | PRN
  Administered 2022-03-04: 650 mg via ORAL
  Filled 2022-03-04 (×2): qty 2

## 2022-03-04 MED ORDER — SPIRONOLACTONE 25 MG PO TABS
25.0000 mg | ORAL_TABLET | Freq: Every day | ORAL | Status: DC
  Administered 2022-03-05 – 2022-03-11 (×7): 25 mg via ORAL
  Filled 2022-03-04 (×7): qty 1

## 2022-03-04 MED ORDER — SENNOSIDES-DOCUSATE SODIUM 8.6-50 MG PO TABS: 1.0000 | ORAL_TABLET | Freq: Every evening | ORAL | Status: DC | PRN

## 2022-03-04 MED ORDER — FUROSEMIDE 10 MG/ML IJ SOLN
100.0000 mg | Freq: Once | INTRAVENOUS | Status: AC
  Administered 2022-03-04: 100 mg via INTRAVENOUS
  Filled 2022-03-04: qty 10

## 2022-03-04 MED ORDER — SERTRALINE HCL 50 MG PO TABS
50.0000 mg | ORAL_TABLET | Freq: Every day | ORAL | Status: DC
  Administered 2022-03-05 – 2022-03-11 (×7): 50 mg via ORAL
  Filled 2022-03-04 (×7): qty 1

## 2022-03-04 MED ORDER — ACETAMINOPHEN 650 MG RE SUPP: 650.0000 mg | Freq: Four times a day (QID) | RECTAL | Status: DC | PRN

## 2022-03-04 MED ORDER — ONDANSETRON HCL 4 MG/2ML IJ SOLN
4.0000 mg | Freq: Once | INTRAMUSCULAR | Status: AC
  Administered 2022-03-04: 4 mg via INTRAVENOUS
  Filled 2022-03-04: qty 2

## 2022-03-04 MED ORDER — APIXABAN 5 MG PO TABS
5.0000 mg | ORAL_TABLET | Freq: Two times a day (BID) | ORAL | Status: DC
  Administered 2022-03-04 – 2022-03-11 (×14): 5 mg via ORAL
  Filled 2022-03-04 (×14): qty 1

## 2022-03-04 NOTE — Assessment & Plan Note (Signed)
Sinus rhythm on admission. -Continue Toprol-XL and Eliquis

## 2022-03-04 NOTE — ED Triage Notes (Signed)
[facility] BIB [facility] from home for evaluation of generalized weakness and malaise for the last three days, history of CHF and COPD. Patient also reports intermittent chest pain during that time.

## 2022-03-04 NOTE — Hospital Course (Signed)
Charles Gray is a 66 y.o. male with medical history significant for chronic HFpEF (EF 60-65%), PAF on Eliquis, history of PE/DVT, COPD, cirrhosis, HTN, HLD, iron deficiency anemia, GAD, OSA nonadherent to CPAP who is admitted with acute on chronic HFpEF.

## 2022-03-04 NOTE — Telephone Encounter (Signed)
Received call from Mr. Dannemiller who reports he is not feeling well. He complains of weakness, chest pain and dizziness with some shortness of breath on exertion. He stated on the phone, "I feel like I am going to faint." He indicated he had not yet taken his medications today. He did not submit a cardio mems reading today. Yesterday was 7 with a goal of 9. His nurse aide Joycelyn Schmid was in the home with him and I spoke to her and she reports he appears to be not feeling well and looks pale. I advised her to call EMS for further evaluation. She did so and [employer]. EMS evaluated patient and stressed the importance that he should be further evaluated in the ER. Demetrius agreed with this and was transported to [facility].  I will plan to follow up next week in the home for paramedicine visit. He agreed. Call complete.   [name], [place] 03/04/2022

## 2022-03-04 NOTE — Assessment & Plan Note (Signed)
Presenting with progressive weight gain, DOE, PND, orthopnea, lower extremity edema.  BNP unreliable in setting of super morbid obesity.  Last EF 60-65% 07/09/2021. -Continue IV Lasix 80 mg twice daily -Update echocardiogram -Continue spironolactone 25 mg daily (pharmacy equivalent for home eplerenone) -Continue Toprol-XL 25 mg daily -Strict I/O's, Daily weights

## 2022-03-04 NOTE — Assessment & Plan Note (Signed)
Hemoglobin stable at 9.4.

## 2022-03-04 NOTE — Assessment & Plan Note (Signed)
Continue pravastatin 

## 2022-03-04 NOTE — ED Provider Triage Note (Signed)
Emergency Medicine Provider Triage Evaluation Note  Charles Gray , a 66 y.o. male  was evaluated in triage.  Pt complains of weakness for the last 3 days.  Also increasing shortness of breath and dyspnea on exertion, lower extremity swelling and intermittent substernal chest pain.  He feels weak, has a cough in the morning which is nonproductive.  States he is on fluid diuretics no missed doses..  Review of Systems  Per HPI  Physical Exam  BP (!) 143/67   Pulse 96   Temp 97.8 F (36.6 C) (Oral)   Resp 20   SpO2 98%  Gen:   Awake, no distress   Resp:  Decreased breath sounds, poor effort MSK:   Moves extremities without difficulty  Other:  Pitting edema lower extremities bilaterally  Medical Decision Making  Medically screening exam initiated at 3:19 PM.  Appropriate orders placed.  Charles Gray was informed that the remainder of the evaluation will be completed by another provider, this initial triage assessment does not replace that evaluation, and the importance of remaining in the ED until their evaluation is complete.     Charles Raring, PA-C 03/04/22 1520

## 2022-03-04 NOTE — Assessment & Plan Note (Signed)
Not using CPAP due to reported intolerance.

## 2022-03-04 NOTE — ED Provider Notes (Signed)
[facility]ERGENCY DEPARTMENT Provider Note  CSN: [id number] Arrival date & time: 03/04/22 1457  Chief Complaint(s) Weakness  HPI Mory Herrman is a 66 y.o. male with PMH chronic CHF, PE DVT on Eliquis, COPD, paroxysmal A-fib, cirrhosis, HTN, HLD, OSA who presents emergency department for evaluation of dyspnea, weakness and lower extremity edema.  Patient has a long history of the symptoms and is currently being visited by paramedics multiple times a week to check in on his progress.  On reviewing his paramedic notes it appears the patient has been gaining weight and has likely been failing his outpatient diuretic regimen.  Patient states over the last 1 and half week he has been getting progressively weaker and states that he is unable to ambulate now due to pain in his lower extremities.  He states that he believes he has gained at least 15 pounds.  He currently denies abdominal pain, headache, fever or other systemic symptoms.  He does endorse some nausea and vomiting as well.   Past Medical History Past Medical History:  Diagnosis Date   Acute on chronic congestive heart failure (HCC)    Acute on chronic diastolic CHF (congestive heart failure) ([place]) 08/23/2019   Acute on chronic right-sided heart failure ([place]) 08/06/2019   Acute respiratory failure with hypoxia (HCC)    Anemia of chronic disease    Atrial fibrillation ([place]) 12/23/2019   Atrial tachycardia ([place]) 08/12/2019   Benign essential HTN    Chest pain 08/23/2019   Chronic anticoagulation 12/26/2019   Chronic heart failure with preserved EF 05/30/2019   Echo 07/2019: EF 55-60, GR 1 DD   Chronic pain syndrome    Cirrhosis of liver without ascites ([place])    Class 2 severe obesity due to excess calories with serious comorbidity and body mass index (BMI) of 39.0 to 39.9 in adult ([place]) 05/30/2019   Debility 03/13/2019   Drug induced constipation    Dyspnea    Fatty liver disease, nonalcoholic    GIB  (gastrointestinal bleeding) 03/04/2019   Heart failure, systolic, acute ([place]) 3/47/4259   History of pulmonary embolism 08/12/2019   Hyperlipidemia 01/24/2019   Hypertension 01/24/2019   Hypoalbuminemia due to protein-calorie malnutrition (HCC)    Hypokalemia    Hyponatremia    Hypotension due to drugs    Insomnia 01/24/2019   Lactic acidosis 08/23/2019   Lumbar disc herniation 01/24/2019   Mixed hyperlipidemia 01/24/2019   Morbid obesity with BMI of 40.0-44.9, adult ([place]) 05/30/2019   Normocytic anemia 06/08/2019   Occasional tremors 08/12/2019   OSA (obstructive sleep apnea) 08/12/2019   Palpitations 12/26/2019   Peripheral edema    Personal history of DVT (deep vein thrombosis) 12/26/2019   Portal hypertensive gastropathy ([place]) 03/06/2019   Seen on EGD 03/06/2019   Pulmonary emboli ([place]) 06/08/2019   Pulmonary embolism ([place]) 06/08/2019   Redness and swelling of lower leg 08/23/2019   Shock ([place])    Thrombocytopenia ([place])    Patient Active Problem List   Diagnosis Date Noted   Dyslipidemia 07/17/2021   Lymphedema 07/08/2021   Melena    Gastric polyps    UGIB (upper gastrointestinal bleed) 04/06/2021   Acute encephalopathy 12/21/2020   COPD with acute exacerbation ([place]) 12/21/2020   Chronic diastolic (congestive) heart failure ([place]) 11/14/2020   Syncope 10/28/2020   CHF (congestive heart failure) ([place]) 10/28/2020   Chronic venous stasis dermatitis of both lower extremities 08/27/2020   Cellulitis of both lower extremities 08/14/2020   Iron deficiency anemia 08/14/2020  GERD without esophagitis 06/10/2020   Atrial fibrillation with rapid ventricular response ([place]) 03/24/2020   Acute on chronic right heart failure ([place]) 01/27/2020   Acute respiratory failure with hypoxia (HCC)    Fatty liver disease, nonalcoholic    Shock ([place])    Personal history of DVT (deep vein thrombosis) 12/26/2019   Chronic anticoagulation 12/26/2019   Palpitations 12/26/2019   Paroxysmal atrial  fibrillation ([place]) 12/23/2019   Heart failure, systolic, acute ([place]) 07/37/1062   Acute on chronic congestive heart failure (HCC)    Acute on chronic diastolic CHF (congestive heart failure) ([place]) 08/23/2019   Chest pain 08/23/2019   Lactic acidosis 08/23/2019   History of pulmonary embolism 08/12/2019   Atrial tachycardia 08/12/2019   Occasional tremors 08/12/2019   OSA (obstructive sleep apnea) 08/12/2019   AKI (acute kidney injury) ([place]) 08/08/2019   Acute on chronic right-sided heart failure ([place]) 08/06/2019   Acute pulmonary embolism ([place]) 06/08/2019   Normocytic anemia 06/08/2019   Pulmonary emboli ([place]) 06/08/2019   Chronic heart failure with preserved ejection fraction ([place]) 05/30/2019   Class 2 severe obesity due to excess calories with serious comorbidity and body mass index (BMI) of 39.0 to 39.9 in adult ([place]) 05/30/2019   Morbid obesity with BMI of 40.0-44.9, adult ([place]) 05/30/2019   Hyponatremia    Drug induced constipation    Peripheral edema    Hypotension due to drugs    Hypoalbuminemia due to protein-calorie malnutrition (HCC)    Thrombocytopenia (HCC)    Anemia of chronic disease    Cirrhosis of liver without ascites ([place])    Chronic pain syndrome    Debility 03/13/2019   Portal hypertensive gastropathy ([place]) 03/06/2019   Dyspnea    GIB (gastrointestinal bleeding) 03/04/2019   Mixed hyperlipidemia 01/24/2019   Insomnia 01/24/2019   Hyperlipidemia 01/24/2019   Essential hypertension 01/24/2019   Lumbar disc herniation 01/24/2019   Home Medication(s) Prior to Admission medications   Medication Sig Start Date End Date Taking? Authorizing Provider  apixaban (ELIQUIS) 5 MG TABS tablet Take 1 tablet (5 mg total) by mouth 2 (two) times daily. 11/19/21   Bensimhon, Shaune Pascal, MD  Buprenorphine HCl-Naloxone HCl 8-2 MG FILM Place 1 tablet under the tongue 2 (two) times daily.    [provider]  eplerenone (INSPRA) 50 MG tablet TAKE 2 TABLETS BY MOUTH DAILY  08/20/21   Milford, Maricela Bo, FNP  gabapentin (NEURONTIN) 800 MG tablet Take 1 tablet (800 mg total) by mouth 3 (three) times daily. 03/23/19   Angiulli, Lavon Paganini, PA-C  lactulose, encephalopathy, (CHRONULAC) 10 GM/15ML SOLN Take 30 mLs (20 g total) by mouth 3 (three) times daily. 07/17/21   Arrien, Jimmy Picket, MD  metolazone (ZAROXOLYN) 2.5 MG tablet Take 1 tablet (2.5 mg total) by mouth 3 (three) times a week. Monday, Wednesday and Fridays 01/02/22   Joette Catching, PA-C  metoprolol succinate (TOPROL XL) 25 MG 24 hr tablet Take 1 tablet (25 mg total) by mouth at bedtime. 11/19/21   Bensimhon, Shaune Pascal, MD  pantoprazole (PROTONIX) 40 MG tablet Take 40 mg by mouth 2 (two) times daily before a meal.    [provider]  polyethylene glycol (MIRALAX / GLYCOLAX) 17 g packet Take 17 g by mouth as needed for mild constipation (mix and drink as directed).    [provider]  potassium chloride SA (KLOR-CON M) 20 MEQ tablet Take 5 tablets (100 mEq total) by mouth 3 (three) times daily. Take one tablet extra (40 meq)  with metolazone. 08/06/21   Larey Dresser, MD  pravastatin (PRAVACHOL) 20 MG tablet Take 1 tablet (20 mg total) by mouth daily. 11/19/21   Bensimhon, Shaune Pascal, MD  sertraline (ZOLOFT) 50 MG tablet Take 1 tablet (50 mg total) by mouth daily. 01/29/22   [place]ford, Maricela Bo, FNP  tamsulosin (FLOMAX) 0.4 MG CAPS capsule Take 1 capsule (0.4 mg total) by mouth daily. Additional refills will need to come from PCP 11/19/21   Bensimhon, Shaune Pascal, MD  torsemide (DEMADEX) 100 MG tablet Take 1 tablet (100 mg total) by mouth twice daily. 12/02/21   Larey Dresser, MD  triamcinolone (KENALOG) 0.025 % ointment Apply 1 application. topically 2 (two) times daily. 09/02/21   Vu, Rockey Situ, MD  VENTOLIN HFA 108 (90 Base) MCG/ACT inhaler Inhale 1 puff into the lungs every 4 (four) hours as needed for shortness of breath. 03/23/19   Angiulli, Lavon Paganini, PA-C  zolpidem (AMBIEN) 10 MG tablet Take 1  tablet (10 mg total) by mouth at bedtime as needed for sleep. 12/23/20   Shelly Coss, MD  bumetanide (BUMEX) 1 MG tablet Take 4 tablets (4 mg total) by mouth daily with breakfast AND 3 tablets (3 mg total) every evening. 05/10/20 06/13/20  Larey Dresser, MD  metoprolol tartrate (LOPRESSOR) 50 MG tablet Take 1 tablet (50 mg total) by mouth 2 (two) times daily. 02/09/20 04/11/20  Mercy Riding, MD                                                                                                                                    Past Surgical History Past Surgical History:  Procedure Laterality Date   BIOPSY  04/08/2021   Procedure: BIOPSY;  Surgeon: Ladene Artist, MD;  Location[facility] ENDOSCOPY;  Service: Endoscopy;;   ESOPHAGOGASTRODUODENOSCOPY N/A 04/08/2021   Procedure: ESOPHAGOGASTRODUODENOSCOPY (EGD);  Surgeon: Ladene Artist, MD;  Location: [facility] ENDOSCOPY;  Service: Endoscopy;  Laterality: N/A;   ESOPHAGOGASTRODUODENOSCOPY (EGD) WITH PROPOFOL N/A 03/05/2019   Procedure: ESOPHAGOGASTRODUODENOSCOPY (EGD) WITH PROPOFOL;  Surgeon: Doran Stabler, MD;  Location: [facility];  Service: Gastroenterology;  Laterality: N/A;  Large Blood Clot Removed   ESOPHAGOGASTRODUODENOSCOPY (EGD) WITH PROPOFOL N/A 03/06/2019   Procedure: ESOPHAGOGASTRODUODENOSCOPY (EGD) WITH PROPOFOL;  Surgeon: Thornton Park, MD;  Location: [facility];  Service: Gastroenterology;  Laterality: N/A;   HEMORROIDECTOMY     PRESSURE SENSOR/CARDIOMEMS N/A 11/14/2020   Procedure: PRESSURE SENSOR/CARDIOMEMS;  Surgeon: Larey Dresser, MD;  Location: [facility];  Service: Cardiovascular;  Laterality: N/A;   RIGHT HEART CATH N/A 08/09/2019   Procedure: RIGHT HEART CATH;  Surgeon: Larey Dresser, MD;  Location: [facility];  Service: Cardiovascular;  Laterality: N/A;   RIGHT HEART CATH N/A 11/01/2020   Procedure: RIGHT HEART CATH;  Surgeon: Larey Dresser, MD;  Location: [facility];  Service:  Cardiovascular;  Laterality: N/A;   RIGHT  HEART CATH N/A 11/14/2020   Procedure: RIGHT HEART CATH;  Surgeon: Larey Dresser, MD;  Location: [facility];  Service: Cardiovascular;  Laterality: N/A;   RIGHT/LEFT HEART CATH AND CORONARY ANGIOGRAPHY N/A 05/10/2020   Procedure: RIGHT/LEFT HEART CATH AND CORONARY ANGIOGRAPHY;  Surgeon: Larey Dresser, MD;  Location: [facility];  Service: Cardiovascular;  Laterality: N/A;   Family History Family History  Problem Relation Age of Onset   Hyperlipidemia Mother    Hypertension Mother    Stroke Mother    CAD Maternal Grandmother    CAD Maternal Grandfather     Social History Social History   Tobacco Use   Smoking status: Never   Smokeless tobacco: Never  Vaping Use   Vaping Use: Never used  Substance Use Topics   Alcohol use: Not Currently   Drug use: Not Currently    Types: Marijuana    Comment: Smoked for 30 years   Allergies Penicillins  Review of Systems Review of Systems  Constitutional:  Positive for fatigue.  Respiratory:  Positive for shortness of breath.   Cardiovascular:  Positive for leg swelling.  Gastrointestinal:  Positive for nausea and vomiting.    Physical Exam Vital Signs  I have reviewed the triage vital signs BP 137/71 (BP Location: Left Arm)   Pulse 77   Temp 97.8 F (36.6 C) (Oral)   Resp 18   SpO2 97%   Physical Exam Constitutional:      General: He is not in acute distress.    Appearance: Normal appearance. He is ill-appearing.  HENT:     Head: Normocephalic and atraumatic.     Nose: No congestion or rhinorrhea.  Eyes:     General:        Right eye: No discharge.        Left eye: No discharge.     Extraocular Movements: Extraocular movements intact.     Pupils: Pupils are equal, round, and reactive to light.  Cardiovascular:     Rate and Rhythm: Normal rate and regular rhythm.     Heart sounds: No murmur heard. Pulmonary:     Effort: No respiratory distress.     Breath  sounds: No wheezing or rales.  Abdominal:     General: There is no distension.     Tenderness: There is no abdominal tenderness.  Musculoskeletal:        General: Normal range of motion.     Cervical back: Normal range of motion.     Right lower leg: Edema present.     Left lower leg: Edema present.  Skin:    General: Skin is warm and dry.  Neurological:     General: No focal deficit present.     Mental Status: He is alert.     ED Results and Treatments Labs (all labs ordered are listed, but only abnormal results are displayed) Labs Reviewed  CBC WITH DIFFERENTIAL/PLATELET - Abnormal; Notable for the following components:      Result Value   Hemoglobin 9.4 (*)    HCT 33.4 (*)    MCV 74.6 (*)    MCH 21.0 (*)    MCHC 28.1 (*)    RDW 17.8 (*)    Platelets 137 (*)    Lymphs Abs 0.5 (*)    All other components within normal limits  COMPREHENSIVE METABOLIC PANEL - Abnormal; Notable for the following components:   Chloride 97 (*)    Glucose, Bld 136 (*)  Albumin 3.3 (*)    AST 50 (*)    All other components within normal limits  RESP PANEL BY RT-PCR (FLU A&B, COVID) ARPGX2  BRAIN NATRIURETIC PEPTIDE  URINALYSIS, ROUTINE W REFLEX MICROSCOPIC  TROPONIN I (HIGH SENSITIVITY)  TROPONIN I (HIGH SENSITIVITY)                                                                                                                          Radiology DG Chest 2 View  Result Date: 03/04/2022 CLINICAL DATA:  Generalized weakness and malaise for 3 days. History of congestive heart failure and COPD. Chest pain. EXAM: CHEST - 2 VIEW COMPARISON:  07/07/2021 FINDINGS: Heart size and pulmonary vascularity are normal. Surgical clip over the left hilum. Emphysematous changes in the lungs. No airspace disease or consolidation. No pleural effusions. No pneumothorax. Mediastinal contours appear intact. IMPRESSION: Emphysematous changes in the lungs. No evidence of active pulmonary disease. Electronically  Signed   By: Lucienne Capers M.D.   On: 03/04/2022 16:12    Pertinent labs & imaging results that were available during my care of the patient were reviewed by me and considered in my medical decision making (see MDM for details).  Medications Ordered in ED Medications - No data to display                                                                                                                                   Procedures Procedures  (including critical care time)  Medical Decision Making / ED Course   This patient presents to the ED for concern of fatigue, leg swelling, dyspnea, this involves an extensive number of treatment options, and is a complaint that carries with it a high risk of complications and morbidity.  The differential diagnosis includes CHF exacerbation, electrolyte abnormality, viral URI, pneumonia, ACS  MDM: Patient seen the emergency room for evaluation of multiple complaints as described above.  Physical exam reveals an ill-appearing patient with significant bilateral lower extremity edema with associated venous stasis.  Laboratory evaluation with a iron deficiency anemia hemoglobin 9.4 with an MCV of 74.6 but is otherwise unremarkable.  BNP is normal but unreliable in the setting of obesity.  COVID and flu negative.  Chest x-ray with no acute findings.  Patient takes a sizable amount of torsemide at home and we will begin diuresis with 100 mg of Lasix.  Due to inability  to perform his ADLs secondary to likely worsening fluid overload, patient require hospital admission for diuresis.   Additional history obtained:  -External records from outside source obtained and reviewed including: Chart review including previous notes, labs, imaging, consultation notes   Lab Tests: -I ordered, reviewed, and interpreted labs.   The pertinent results include:   Labs Reviewed  CBC WITH DIFFERENTIAL/PLATELET - Abnormal; Notable for the following components:      Result Value    Hemoglobin 9.4 (*)    HCT 33.4 (*)    MCV 74.6 (*)    MCH 21.0 (*)    MCHC 28.1 (*)    RDW 17.8 (*)    Platelets 137 (*)    Lymphs Abs 0.5 (*)    All other components within normal limits  COMPREHENSIVE METABOLIC PANEL - Abnormal; Notable for the following components:   Chloride 97 (*)    Glucose, Bld 136 (*)    Albumin 3.3 (*)    AST 50 (*)    All other components within normal limits  RESP PANEL BY RT-PCR (FLU A&B, COVID) ARPGX2  BRAIN NATRIURETIC PEPTIDE  URINALYSIS, ROUTINE W REFLEX MICROSCOPIC  TROPONIN I (HIGH SENSITIVITY)  TROPONIN I (HIGH SENSITIVITY)      EKG   EKG Interpretation  Date/Time:  Wednesday March 04 2022 14:50:56 EST Ventricular Rate:  98 PR Interval:  230 QRS Duration: 110 QT Interval:  374 QTC Calculation: 477 R Axis:   -57 Text Interpretation: Sinus rhythm with 1st degree A-V block Pulmonary disease pattern Left anterior fascicular block Nonspecific ST abnormality Abnormal ECG When compared with ECG of 10-Dec-2021 14:08, Artifact Confirmed by Aletta Edouard [phone]) on 03/05/2022 9:38:31 AM         Imaging Studies ordered: I ordered imaging studies including chest x-ray I independently visualized and interpreted imaging. I agree with the radiologist interpretation   Medicines ordered and prescription drug management: No orders of the defined types were placed in this encounter.   -I have reviewed the patients home medicines and have made adjustments as needed  Critical interventions none    Cardiac Monitoring: The patient was maintained on a cardiac monitor.  I personally viewed and interpreted the cardiac monitored which showed an underlying rhythm of: NSR  Social Determinants of Health:  Factors impacting patients care include: Paramedics visiting patient throughout the week via paramedicine   Reevaluation: After the interventions noted above, I reevaluated the patient and found that they have :stayed the same  Co  morbidities that complicate the patient evaluation  Past Medical History:  Diagnosis Date   Acute on chronic congestive heart failure (HCC)    Acute on chronic diastolic CHF (congestive heart failure) ([place]) 08/23/2019   Acute on chronic right-sided heart failure ([place]) 08/06/2019   Acute respiratory failure with hypoxia (HCC)    Anemia of chronic disease    Atrial fibrillation ([place]) 12/23/2019   Atrial tachycardia ([place]) 08/12/2019   Benign essential HTN    Chest pain 08/23/2019   Chronic anticoagulation 12/26/2019   Chronic heart failure with preserved EF 05/30/2019   Echo 07/2019: EF 55-60, GR 1 DD   Chronic pain syndrome    Cirrhosis of liver without ascites ([place])    Class 2 severe obesity due to excess calories with serious comorbidity and body mass index (BMI) of 39.0 to 39.9 in adult [facility]) 05/30/2019   Debility 03/13/2019   Drug induced constipation    Dyspnea    Fatty liver disease, nonalcoholic    GIB (  gastrointestinal bleeding) 03/04/2019   Heart failure, systolic, acute ([place]) 05/28/8725   History of pulmonary embolism 08/12/2019   Hyperlipidemia 01/24/2019   Hypertension 01/24/2019   Hypoalbuminemia due to protein-calorie malnutrition (HCC)    Hypokalemia    Hyponatremia    Hypotension due to drugs    Insomnia 01/24/2019   Lactic acidosis 08/23/2019   Lumbar disc herniation 01/24/2019   Mixed hyperlipidemia 01/24/2019   Morbid obesity with BMI of 40.0-44.9, adult ([place]) 05/30/2019   Normocytic anemia 06/08/2019   Occasional tremors 08/12/2019   OSA (obstructive sleep apnea) 08/12/2019   Palpitations 12/26/2019   Peripheral edema    Personal history of DVT (deep vein thrombosis) 12/26/2019   Portal hypertensive gastropathy ([place]) 03/06/2019   Seen on EGD 03/06/2019   Pulmonary emboli ([place]) 06/08/2019   Pulmonary embolism ([place]) 06/08/2019   Redness and swelling of lower leg 08/23/2019   Shock ([place])    Thrombocytopenia ([place])       Dispostion: I considered admission for this patient,  and due to fluid overload and need for diuresis, patient require hospital admission     Final Clinical Impression(s) / ED Diagnoses Final diagnoses:  None     @PCDICTATION @    Teressa Lower, MD 03/05/22 1049

## 2022-03-04 NOTE — H&P (Signed)
History and Physical    Stavros Cail RVU:[id number] DOB: 1955-06-25 DOA: 03/04/2022  PCP: Enid Skeens., MD  Patient coming from: Home  I have personally briefly reviewed patient's old medical records in [place]  Chief Complaint: Shortness of breath, edema, weight gain  HPI: Charles Gray is a 66 y.o. male with medical history significant for chronic HFpEF (EF 60-65%), PAF on Eliquis, history of PE/DVT, COPD, cirrhosis, HTN, HLD, iron deficiency anemia, GAD, OSA nonadherent to CPAP who presented to the ED for evaluation of dyspnea and lower extremity edema.  Patient reports 1 and half weeks of progressive dyspnea on exertion and at rest, chest discomfort, cough, increasing lower extremity edema, and at least 10 pound weight gain during the same amount of time.  He has paramedicine centimeters at home and last week was noted to be 15 pounds up.  He did not want to come to the hospital but today was convinced to come to the ED.  He takes torsemide 100 mg daily and reports fair urine output.  ED Course  Labs/Imaging on admission: I have personally reviewed following labs and imaging studies.  Initial vitals showed BP 143/67, pulse 96, RR 20, temp 97.8 F, SpO2 98% on room air.  Labs show sodium 138, potassium 3.9, bicarb 26, BUN 13, creatinine 0.96, serum glucose 136, AST 50, ALT 19, alk phos 88, total bilirubin 1.1, BNP 60.9, WBC 6.5, hemoglobin 9.4, platelets 137,000, troponin negative x 2.  SARS-CoV-2 and influenza PCR negative.  2 view chest x-ray showed emphysematous changes in the lungs.  Patient was given IV Lasix 100 mg.  The hospitalist service was consulted to admit for further evaluation and management.  Review of Systems: All systems reviewed and are negative except as documented in history of present illness above.   Past Medical History:  Diagnosis Date   Acute on chronic congestive heart failure (HCC)    Acute on chronic diastolic CHF (congestive  heart failure) ([place]) 08/23/2019   Acute on chronic right-sided heart failure ([place]) 08/06/2019   Acute respiratory failure with hypoxia (HCC)    Anemia of chronic disease    Atrial fibrillation ([place]) 12/23/2019   Atrial tachycardia ([place]) 08/12/2019   Benign essential HTN    Chest pain 08/23/2019   Chronic anticoagulation 12/26/2019   Chronic heart failure with preserved EF 05/30/2019   Echo 07/2019: EF 55-60, GR 1 DD   Chronic pain syndrome    Cirrhosis of liver without ascites ([place])    Class 2 severe obesity due to excess calories with serious comorbidity and body mass index (BMI) of 39.0 to 39.9 in adult ([place]) 05/30/2019   Debility 03/13/2019   Drug induced constipation    Dyspnea    Fatty liver disease, nonalcoholic    GIB (gastrointestinal bleeding) 03/04/2019   Heart failure, systolic, acute ([place]) 09/27/8370   History of pulmonary embolism 08/12/2019   Hyperlipidemia 01/24/2019   Hypertension 01/24/2019   Hypoalbuminemia due to protein-calorie malnutrition (HCC)    Hypokalemia    Hyponatremia    Hypotension due to drugs    Insomnia 01/24/2019   Lactic acidosis 08/23/2019   Lumbar disc herniation 01/24/2019   Mixed hyperlipidemia 01/24/2019   Morbid obesity with BMI of 40.0-44.9, adult ([place]) 05/30/2019   Normocytic anemia 06/08/2019   Occasional tremors 08/12/2019   OSA (obstructive sleep apnea) 08/12/2019   Palpitations 12/26/2019   Peripheral edema    Personal history of DVT (deep vein thrombosis) 12/26/2019   Portal hypertensive gastropathy ([place]) 03/06/2019  Seen on EGD 03/06/2019   Pulmonary emboli ([place]) 06/08/2019   Pulmonary embolism ([place]) 06/08/2019   Redness and swelling of lower leg 08/23/2019   Shock ([place])    Thrombocytopenia ([place])     Past Surgical History:  Procedure Laterality Date   BIOPSY  04/08/2021   Procedure: BIOPSY;  Surgeon: Ladene Artist, MD;  Location[facility] ENDOSCOPY;  Service: Endoscopy;;   ESOPHAGOGASTRODUODENOSCOPY N/A 04/08/2021   Procedure:  ESOPHAGOGASTRODUODENOSCOPY (EGD);  Surgeon: Ladene Artist, MD;  Location: [facility] ENDOSCOPY;  Service: Endoscopy;  Laterality: N/A;   ESOPHAGOGASTRODUODENOSCOPY (EGD) WITH PROPOFOL N/A 03/05/2019   Procedure: ESOPHAGOGASTRODUODENOSCOPY (EGD) WITH PROPOFOL;  Surgeon: Doran Stabler, MD;  Location: [place];  Service: Gastroenterology;  Laterality: N/A;  Large Blood Clot Removed   ESOPHAGOGASTRODUODENOSCOPY (EGD) WITH PROPOFOL N/A 03/06/2019   Procedure: ESOPHAGOGASTRODUODENOSCOPY (EGD) WITH PROPOFOL;  Surgeon: Thornton Park, MD;  Location: [place];  Service: Gastroenterology;  Laterality: N/A;   HEMORROIDECTOMY     PRESSURE SENSOR/CARDIOMEMS N/A 11/14/2020   Procedure: PRESSURE SENSOR/CARDIOMEMS;  Surgeon: Larey Dresser, MD;  Location: [place] CV LAB;  Service: Cardiovascular;  Laterality: N/A;   RIGHT HEART CATH N/A 08/09/2019   Procedure: RIGHT HEART CATH;  Surgeon: Larey Dresser, MD;  Location: [place] CV LAB;  Service: Cardiovascular;  Laterality: N/A;   RIGHT HEART CATH N/A 11/01/2020   Procedure: RIGHT HEART CATH;  Surgeon: Larey Dresser, MD;  Location: [place] CV LAB;  Service: Cardiovascular;  Laterality: N/A;   RIGHT HEART CATH N/A 11/14/2020   Procedure: RIGHT HEART CATH;  Surgeon: Larey Dresser, MD;  Location: [place] CV LAB;  Service: Cardiovascular;  Laterality: N/A;   RIGHT/LEFT HEART CATH AND CORONARY ANGIOGRAPHY N/A 05/10/2020   Procedure: RIGHT/LEFT HEART CATH AND CORONARY ANGIOGRAPHY;  Surgeon: Larey Dresser, MD;  Location: [place] CV LAB;  Service: Cardiovascular;  Laterality: N/A;    Social History:  reports that he has never smoked. He has never used smokeless tobacco. He reports that he does not currently use alcohol. He reports that he does not currently use drugs after having used the following drugs: Marijuana.  Allergies  Allergen Reactions   Penicillins Other (See Comments)    Caused convulsions as a child  Did it involve  swelling of the face/tongue/throat, SOB, or low BP? N/A Did it involve sudden or severe rash/hives, skin peeling, or any reaction on the inside of your mouth or nose? N/A Did you need to seek medical attention at a hospital or doctor's office?N/A When did it last happen? N/A If all above answers are "NO", may proceed with cephalosporin use.    Family History  Problem Relation Age of Onset   Hyperlipidemia Mother    Hypertension Mother    Stroke Mother    CAD Maternal Grandmother    CAD Maternal Grandfather      Prior to Admission medications   Medication Sig Start Date End Date Taking? Authorizing Provider  apixaban (ELIQUIS) 5 MG TABS tablet Take 1 tablet (5 mg total) by mouth 2 (two) times daily. 11/19/21  Yes Bensimhon, Shaune Pascal, MD  Buprenorphine HCl-Naloxone HCl 8-2 MG FILM Place 1 Film under the tongue in the morning and at bedtime.   Yes [provider]  eplerenone (INSPRA) 50 MG tablet TAKE 2 TABLETS BY MOUTH DAILY Patient taking differently: Take 100 mg by mouth in the morning. 08/20/21  Yes Milford, Maricela Bo, FNP  gabapentin (NEURONTIN) 800 MG tablet Take 1 tablet (800  mg total) by mouth 3 (three) times daily. 03/23/19  Yes Angiulli, Lavon Paganini, PA-C  metolazone (ZAROXOLYN) 2.5 MG tablet Take 1 tablet (2.5 mg total) by mouth 3 (three) times a week. Monday, Wednesday and Fridays Patient taking differently: Take 2.5 mg by mouth every Monday, Wednesday, and Friday. 01/02/22  Yes Joette Catching, PA-C  VENTOLIN HFA 108 [phone] Base) MCG/ACT inhaler Inhale 1 puff into the lungs every 4 (four) hours as needed for shortness of breath. Patient taking differently: Inhale 2 puffs into the lungs every 4 (four) hours as needed for shortness of breath or wheezing. 03/23/19  Yes Angiulli, Lavon Paganini, PA-C  lactulose, encephalopathy, (CHRONULAC) 10 GM/15ML SOLN Take 30 mLs (20 g total) by mouth 3 (three) times daily. Patient taking differently: Take 20 g by mouth 2 (two) times daily.  07/17/21   Arrien, Jimmy Picket, MD  metoprolol succinate (TOPROL XL) 25 MG 24 hr tablet Take 1 tablet (25 mg total) by mouth at bedtime. 11/19/21   Bensimhon, Shaune Pascal, MD  pantoprazole (PROTONIX) 40 MG tablet Take 40 mg by mouth 2 (two) times daily before a meal.    [provider]  polyethylene glycol (MIRALAX / GLYCOLAX) 17 g packet Take 17 g by mouth as needed for mild constipation (mix and drink as directed).    [provider]  potassium chloride SA (KLOR-CON M) 20 MEQ tablet Take 5 tablets (100 mEq total) by mouth 3 (three) times daily. Take one tablet extra (40 meq) with metolazone. 08/06/21   Larey Dresser, MD  pravastatin (PRAVACHOL) 20 MG tablet Take 1 tablet (20 mg total) by mouth daily. 11/19/21   Bensimhon, Shaune Pascal, MD  sertraline (ZOLOFT) 50 MG tablet Take 1 tablet (50 mg total) by mouth daily. 01/29/22   [place]ford, Maricela Bo, FNP  tamsulosin (FLOMAX) 0.4 MG CAPS capsule Take 1 capsule (0.4 mg total) by mouth daily. Additional refills will need to come from PCP 11/19/21   Bensimhon, Shaune Pascal, MD  torsemide (DEMADEX) 100 MG tablet Take 1 tablet (100 mg total) by mouth twice daily. 12/02/21   Larey Dresser, MD  triamcinolone (KENALOG) 0.025 % ointment Apply 1 application. topically 2 (two) times daily. 09/02/21   Vu, Rockey Situ, MD  zolpidem (AMBIEN) 10 MG tablet Take 1 tablet (10 mg total) by mouth at bedtime as needed for sleep. 12/23/20   Shelly Coss, MD  bumetanide (BUMEX) 1 MG tablet Take 4 tablets (4 mg total) by mouth daily with breakfast AND 3 tablets (3 mg total) every evening. 05/10/20 06/13/20  Larey Dresser, MD  metoprolol tartrate (LOPRESSOR) 50 MG tablet Take 1 tablet (50 mg total) by mouth 2 (two) times daily. 02/09/20 04/11/20  Mercy Riding, MD    Physical Exam: Vitals:   03/04/22 1841 03/04/22 2043 03/04/22 2213 03/04/22 2259  BP: 137/71 124/73 133/84 (!) 147/70  Pulse: 77 74 78   Resp: 18 (!) _0 Temp:  98 F (36.7 C) 97.7 F (36.5 C) 97.9  F (36.6 C)  TempSrc:  Oral Oral Oral  SpO2: 97% 99% 100% 97%   Constitutional: Obese man sitting up on the side of the bed.  Eyes: EOMI, lids and conjunctivae normal ENMT: Mucous membranes are moist. Posterior pharynx clear of any exudate or lesions.Normal dentition.  Neck: normal, supple, no masses. Respiratory: clear to auscultation bilaterally, no wheezing, no crackles. Normal respiratory effort. No accessory muscle use.  Cardiovascular: Regular rate and rhythm, no murmurs / rubs /  gallops.  Massive bilateral lower extremity edema. Abdomen: no tenderness, no masses palpated. Musculoskeletal: no clubbing / cyanosis. No joint deformity upper and lower extremities. Good ROM, no contractures. Normal muscle tone.  Skin: Lymphedema and venous stasis dermatitis changes both lower extremities. Neurologic: Sensation intact. Strength 5/5 in all 4.  Psychiatric:  Alert and oriented x 3. Normal mood.   EKG: Personally reviewed. Sinus rhythm, rate 98, first-degree AV block.  Rate is faster when compared to prior.  Assessment/Plan Principal Problem:   Acute on chronic heart failure with preserved ejection fraction (HFpEF) (HCC) Active Problems:   Paroxysmal atrial fibrillation (HCC)   COPD (chronic obstructive pulmonary disease) (HCC)   Essential hypertension   Mixed hyperlipidemia   Anemia of chronic disease   Cirrhosis of liver without ascites (HCC)   OSA (obstructive sleep apnea)   Laden Barreira is a 66 y.o. male with medical history significant for chronic HFpEF (EF 60-65%), PAF on Eliquis, history of PE/DVT, COPD, cirrhosis, HTN, HLD, iron deficiency anemia, GAD, OSA nonadherent to CPAP who is admitted with acute on chronic HFpEF.  Assessment and Plan: * Acute on chronic heart failure with preserved ejection fraction (HFpEF) ([place]) Presenting with progressive weight gain, DOE, PND, orthopnea, lower extremity edema.  BNP unreliable in setting of super morbid obesity.  Last EF 60-65%  07/09/2021. -Continue IV Lasix 80 mg twice daily -Update echocardiogram -Continue spironolactone 25 mg daily (pharmacy equivalent for home eplerenone) -Continue Toprol-XL 25 mg daily -Strict I/O's, Daily weights  Paroxysmal atrial fibrillation (HCC) Sinus rhythm on admission. -Continue Toprol-XL and Eliquis  COPD (chronic obstructive pulmonary disease) (HCC) No wheezing on admission.  Continue albuterol as needed.  Essential hypertension Continue Toprol-XL and spironolactone.  OSA (obstructive sleep apnea) Not using CPAP due to reported intolerance.  Cirrhosis of liver without ascites (HCC) Stable, no sign of hepatic encephalopathy. -Continue spironolactone -Continue lactulose  Anemia of chronic disease Hemoglobin stable at 9.4.  Mixed hyperlipidemia Continue pravastatin.  DVT prophylaxis: Eliquis Code Status: Full code, confirmed with patient on admission Family Communication: Discussed with patient, he has discussed with family Disposition Plan: From home, dispo pending clinical progress Consults called: None Severity of Illness: The appropriate patient status for this patient is INPATIENT. Inpatient status is judged to be reasonable and necessary in order to provide the required intensity of service to ensure the patient's safety. The patient's presenting symptoms, physical exam findings, and initial radiographic and laboratory data in the context of their chronic comorbidities is felt to place them at high risk for further clinical deterioration. Furthermore, it is not anticipated that the patient will be medically stable for discharge from the hospital within 2 midnights of admission.   * I certify that at the point of admission it is my clinical judgment that the patient will require inpatient hospital care spanning beyond 2 midnights from the point of admission due to high intensity of service, high risk for further deterioration and high frequency of surveillance  required.Zada Finders MD [facility]  If 7PM-7AM, please contact night-coverage [email or web address]  03/04/2022, 11:21 PM

## 2022-03-04 NOTE — Assessment & Plan Note (Signed)
Stable, no sign of hepatic encephalopathy. -Continue spironolactone -Continue lactulose

## 2022-03-04 NOTE — ED Provider Triage Note (Signed)
Emergency Medicine Provider Triage Evaluation Note  Charles Gray , a 66 y.o. male  was evaluated in triage.  Pt complains of weakness for the last 3 days.  Also having shortness of breath, lower extremity swelling.  Endorses intermittent chest pain without radiation..  Review of Systems  Per HPI  Physical Exam  BP (!) 143/67   Pulse 96   Temp 97.8 F (36.6 C) (Oral)   Resp 20   SpO2 98%  Gen:   Awake, no distress   Resp:  Normal effort  MSK:   Moves extremities without difficulty  Other:  Lower extremity edema  Medical Decision Making  Medically screening exam initiated at 3:12 PM.  Appropriate orders placed.  Charles Gray was informed that the remainder of the evaluation will be completed by another provider, this initial triage assessment does not replace that evaluation, and the importance of remaining in the ED until their evaluation is complete.     Sherrill Raring, PA-C 03/04/22 1818

## 2022-03-04 NOTE — ED Notes (Signed)
Dr. Patel at bedside 

## 2022-03-04 NOTE — Assessment & Plan Note (Signed)
No wheezing on admission.  Continue albuterol as needed.

## 2022-03-04 NOTE — Progress Notes (Signed)
Patient arrived to room 3E02 from ED.  Assessment complete, VS obtained, and Admission database began.

## 2022-03-04 NOTE — Assessment & Plan Note (Signed)
Continue Toprol-XL and spironolactone.

## 2022-03-05 ENCOUNTER — Other Ambulatory Visit (HOSPITAL_COMMUNITY): Payer: Medicare HMO

## 2022-03-05 ENCOUNTER — Inpatient Hospital Stay (HOSPITAL_COMMUNITY): Payer: Medicare HMO

## 2022-03-05 DIAGNOSIS — I5033 Acute on chronic diastolic (congestive) heart failure: Secondary | ICD-10-CM | POA: Diagnosis not present

## 2022-03-05 LAB — COMPREHENSIVE METABOLIC PANEL
ALT: 21 U/L (ref 0–44)
AST: 52 U/L — ABNORMAL HIGH (ref 15–41)
Albumin: 3.4 g/dL — ABNORMAL LOW (ref 3.5–5.0)
Alkaline Phosphatase: 89 U/L (ref 38–126)
Anion gap: 12 (ref 5–15)
BUN: 12 mg/dL (ref 8–23)
CO2: 28 mmol/L (ref 22–32)
Calcium: 8.9 mg/dL (ref 8.9–10.3)
Chloride: 97 mmol/L — ABNORMAL LOW (ref 98–111)
Creatinine, Ser: 1.09 mg/dL (ref 0.61–1.24)
GFR, Estimated: >60 mL/min (ref >60)
Glucose, Bld: 137 mg/dL — ABNORMAL HIGH (ref 70–99)
Potassium: 3.3 mmol/L — ABNORMAL LOW (ref 3.5–5.1)
Sodium: 137 mmol/L (ref 135–145)
Total Bilirubin: 1.2 mg/dL (ref 0.3–1.2)
Total Protein: 7.6 g/dL (ref 6.5–8.1)

## 2022-03-05 LAB — CBC
HCT: 32.5 % — ABNORMAL LOW (ref 39.0–52.0)
Hemoglobin: 9.4 g/dL — ABNORMAL LOW (ref 13.0–17.0)
MCH: 21.1 pg — ABNORMAL LOW (ref 26.0–34.0)
MCHC: 28.9 g/dL — ABNORMAL LOW (ref 30.0–36.0)
MCV: 72.9 fL — ABNORMAL LOW (ref 80.0–100.0)
Platelets: 145 10*3/uL — ABNORMAL LOW (ref 150–400)
RBC: 4.46 MIL/uL (ref 4.22–5.81)
RDW: 17.7 % — ABNORMAL HIGH (ref 11.5–15.5)
WBC: 6.9 10*3/uL (ref 4.0–10.5)
nRBC: 0 % (ref 0.0–0.2)

## 2022-03-05 LAB — MAGNESIUM: Magnesium: 2.1 mg/dL (ref 1.7–2.4)

## 2022-03-05 MED ORDER — BUPRENORPHINE HCL-NALOXONE HCL 8-2 MG SL SUBL
1.0000 | SUBLINGUAL_TABLET | Freq: Every day | SUBLINGUAL | Status: DC
  Administered 2022-03-05 – 2022-03-11 (×7): 1 via SUBLINGUAL
  Filled 2022-03-05 (×7): qty 1

## 2022-03-05 MED ORDER — POTASSIUM CHLORIDE CRYS ER 20 MEQ PO TBCR
40.0000 meq | EXTENDED_RELEASE_TABLET | Freq: Two times a day (BID) | ORAL | Status: AC
  Administered 2022-03-05 (×2): 40 meq via ORAL
  Filled 2022-03-05 (×2): qty 2

## 2022-03-05 NOTE — Plan of Care (Signed)

## 2022-03-05 NOTE — Progress Notes (Addendum)
PROGRESS NOTE    Charles Gray  [name] DOB: [birth year] DOA: 03/04/2022 PCP: Charles Gray., MD  89 /M with history of HFpEF (EF 60-65%), PAF on Eliquis, history of PE/DVT, COPD, cirrhosis, HTN, HLD, iron deficiency anemia, GAD, OSA nonadherent to CPAP who presented to the ED for evaluation of dyspnea and lower extremity edema x 10days, 15lb weight gain In ED, labs BNP of 60, creat 0.9, CXR w/ emphysema -He is followed by advanced heart failure team and para medicine  Subjective: Feels poorly, has nausea, short of breath, weak   Assessment and Plan:  Acute on chronic diastolic CHF 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  Also has a component of chronic venous insufficiency - Last echo w/  EF 60-65% 07/09/2021. - Continue IV Lasix 80 mg twice daily, aldactone, toprol -Reportedly intolerant to Jardiance(GI symptoms) -add UNNA boots -US paracentesis today-fluid thrill on exam, ? ascites -Repeat echocardiogram, assess RV -Strict I/O's, Daily weights  Paroxysmal atrial fibrillation (HCC) Sinus rhythm on admission. -Continue Toprol-XL and Eliquis  History of DVT/PE -Continue Eliquis  COPD (chronic obstructive pulmonary disease) (HCC) -Continue as needed albuterol  Obesity OSA (obstructive sleep apnea) -Reportedly intolerant to CPAP  Cirrhosis of liver without ascites (HCC) -Diuretics as above -Continue  lactulose  Anemia of chronic disease Hemoglobin stable at 9.4. -Check iron studies in a.m.  Mixed hyperlipidemia Continue pravastatin.  Anxiety -Continue sertraline   DVT prophylaxis: apixaban Code Status: FUll code Family Communication: Disposition Plan:   Consultants:    Procedures:   Antimicrobials:    Objective: Vitals:   03/04/22 2213 03/04/22 2259 03/05/22 0011 03/05/22 0432  BP: 133/84 (!) 147/70  130/74  Pulse: 78   100  Resp: 20 17  17   Temp: 97.7 F (36.5 C) 97.9  F (36.6 C)  97.7 F (36.5 C)  TempSrc: Oral Oral  Oral  SpO2: 100% 97%  99%  Weight:   (!) 181.6 kg (!) 179.2 kg    Intake/Output Summary (Last 24 hours) at 03/05/2022 0527 Last data filed at 03/05/2022 0434 Gross per 24 hour  Intake 480 ml  Output 2650 ml  Net -2170 ml   Filed Weights   03/05/22 0011 03/05/22 0432  Weight: (!) 181.6 kg (!) 179.2 kg    Examination:  General exam: Appears calm and comfortable  HEENT" neck obese, unable to assess JVD Respiratory system: clear Cardiovascular system: S1 & S2 heard, RRR.  Abd: nondistended, soft and nontender.Normal bowel sounds heard. Central nervous system: Alert and oriented. No focal neurological deficits. Extremities: 2plus edema with dry skin and chronic changes Skin: No rashes Psychiatry:  Mood & affect appropriate.     Data Reviewed:   CBC: Recent Labs  Lab 03/04/22 1516 03/05/22 0023  WBC 6.5 6.9  NEUTROABS 5.4  --   HGB 9.4* 9.4*  HCT 33.4* 32.5*  MCV 74.6* 72.9*  PLT 137* 825*   Basic Metabolic Panel: Recent Labs  Lab 03/04/22 1516 03/05/22 0023  NA 138 137  K 3.9 3.3*  CL 97* 97*  CO2 26 28  GLUCOSE 136* 137*  BUN 13 12  CREATININE 0.96 1.09  CALCIUM 9.2 8.9  MG  --  2.1   GFR: Estimated Creatinine Clearance: 124.5 mL/min (by C-G formula based on SCr of 1.09 mg/dL). Liver Function Tests: Recent Labs  Lab 03/04/22 1516 03/05/22 0023  AST 50* 52*  ALT 19 21  ALKPHOS 88 89  BILITOT 1.1 1.2  PROT 7.4  7.6  ALBUMIN 3.3* 3.4*   No results for input(s): "LIPASE", "AMYLASE" in the last 168 hours. No results for input(s): "AMMONIA" in the last 168 hours. Coagulation Profile: No results for input(s): "INR", "PROTIME" in the last 168 hours. Cardiac Enzymes: No results for input(s): "CKTOTAL", "CKMB", "CKMBINDEX", "TROPONINI" in the last 168 hours. BNP (last 3 results) No results for input(s): "PROBNP" in the last 8760 hours. HbA1C: No results for input(s): "HGBA1C" in the last 72  hours. CBG: No results for input(s): "GLUCAP" in the last 168 hours. Lipid Profile: No results for input(s): "CHOL", "HDL", "LDLCALC", "TRIG", "CHOLHDL", "LDLDIRECT" in the last 72 hours. Thyroid Function Tests: No results for input(s): "TSH", "T4TOTAL", "FREET4", "T3FREE", "THYROIDAB" in the last 72 hours. Anemia Panel: No results for input(s): "VITAMINB12", "FOLATE", "FERRITIN", "TIBC", "IRON", "RETICCTPCT" in the last 72 hours. Urine analysis:    Component Value Date/Time   COLORURINE YELLOW 03/04/2022 2000   APPEARANCEUR CLEAR 03/04/2022 2000   LABSPEC 1.010 03/04/2022 2000   PHURINE 8.0 03/04/2022 2000   GLUCOSEU NEGATIVE 03/04/2022 2000   HGBUR NEGATIVE 03/04/2022 2000   BILIRUBINUR NEGATIVE 03/04/2022 2000   KETONESUR NEGATIVE 03/04/2022 2000   PROTEINUR NEGATIVE 03/04/2022 2000   NITRITE NEGATIVE 03/04/2022 2000   LEUKOCYTESUR NEGATIVE 03/04/2022 2000   Sepsis Labs: @LABRCNTIP (procalcitonin:4,lacticidven:4)  ) Recent Results (from the past 240 hour(s))  Resp Panel by RT-PCR (Flu A&B, Covid) Anterior Nasal Swab     Status: None   Collection Time: 03/04/22  3:33 PM   Specimen: Anterior Nasal Swab  Result Value Ref Range Status   SARS Coronavirus 2 by RT PCR NEGATIVE NEGATIVE Final    Comment: (NOTE) SARS-CoV-2 target nucleic acids are NOT DETECTED.  The SARS-CoV-2 RNA is generally detectable in upper respiratory specimens during the acute phase of infection. The lowest concentration of SARS-CoV-2 viral copies this assay can detect is 138 copies/mL. A negative result does not preclude SARS-Cov-2 infection and should not be used as the sole basis for treatment or other patient management decisions. A negative result may occur with  improper specimen collection/handling, submission of specimen other than nasopharyngeal swab, presence of viral mutation(s) within the areas targeted by this assay, and inadequate number of viral copies(<138 copies/mL). A negative result  must be combined with clinical observations, patient history, and epidemiological information. The expected result is Negative.  Fact Sheet for Patients:  EntrepreneurPulse.com.au  Fact Sheet for Healthcare Providers:  IncredibleEmployment.be  This test is no t yet approved or cleared by the [place] FDA and  has been authorized for detection and/or diagnosis of SARS-CoV-2 by FDA under an Emergency Use Authorization (EUA). This EUA will remain  in effect (meaning this test can be used) for the duration of the COVID-19 declaration under Section 564(b)(1) of the Act, 21 U.S.C.section 360bbb-3(b)(1), unless the authorization is terminated  or revoked sooner.       Influenza A by PCR NEGATIVE NEGATIVE Final   Influenza B by PCR NEGATIVE NEGATIVE Final    Comment: (NOTE) The Xpert Xpress SARS-CoV-2/FLU/RSV plus assay is intended as an aid in the diagnosis of influenza from Nasopharyngeal swab specimens and should not be used as a sole basis for treatment. Nasal washings and aspirates are unacceptable for Xpert Xpress SARS-CoV-2/FLU/RSV testing.  Fact Sheet for Patients: EntrepreneurPulse.com.au  Fact Sheet for Healthcare Providers: IncredibleEmployment.be  This test is not yet approved or cleared by the [place] FDA and has been authorized for detection and/or diagnosis of SARS-CoV-2 by FDA under an Emergency  Use Authorization (EUA). This EUA will remain in effect (meaning this test can be used) for the duration of the COVID-19 declaration under Section 564(b)(1) of the Act, 21 U.S.C. section 360bbb-3(b)(1), unless the authorization is terminated or revoked.  Performed at [facility], [place] [address] [place], [place] [address]      Radiology Studies: DG Chest 2 View  Result Date: 03/04/2022 CLINICAL DATA:  Generalized weakness and malaise for 3 days. History of congestive heart  failure and COPD. Chest pain. EXAM: CHEST - 2 VIEW COMPARISON:  07/07/2021 FINDINGS: Heart size and pulmonary vascularity are normal. Surgical clip over the left hilum. Emphysematous changes in the lungs. No airspace disease or consolidation. No pleural effusions. No pneumothorax. Mediastinal contours appear intact. IMPRESSION: Emphysematous changes in the lungs. No evidence of active pulmonary disease. Electronically Signed   By: Lucienne Capers M.D.   On: 03/04/2022 16:12     Scheduled Meds:  apixaban  5 mg Oral BID   furosemide  80 mg Intravenous Q12H   lactulose  33.33 g Oral BID   metoprolol succinate  25 mg Oral QHS   pravastatin  20 mg Oral QHS   sertraline  50 mg Oral Daily   sodium chloride flush  3 mL Intravenous Q12H   spironolactone  25 mg Oral Daily   tamsulosin  0.4 mg Oral Daily   Continuous Infusions:   LOS: 1 day    Time spent: 23mn    PDomenic Polite MD [facility]   03/05/2022, 5:27 AM

## 2022-03-05 NOTE — Progress Notes (Signed)
Refused cpap.

## 2022-03-05 NOTE — Progress Notes (Signed)
Orthopedic Tech Progress Note Patient Details:  Charles Gray [birth year] [id number]  Unna boots applied today. They will be ready to reapply on Monday, 11/27.  Ortho Devices Type of Ortho Device: Haematologist Ortho Device/Splint Location: BLE Ortho Device/Splint Interventions: Ordered, Application   Post Interventions Patient Tolerated: Well Instructions Provided: Care of device  [name] 03/05/2022, 6:49 PM

## 2022-03-05 NOTE — Consult Note (Signed)
Advanced Heart Failure Team Consult Note   Primary Physician: Enid Skeens., MD PCP-Cardiologist:  Loralie Champagne, MD  Reason for Consultation: ADHF  HPI:    Charles Gray is seen today for evaluation of decompensated HF at the request of Dr. Broadus John.   66 y.o. obese male, nonsmoker w/ mild COPD, cirrhosis, DVT/PE 05/2019 on Eliquis, chronic diastolic HF w/ suspected prominent RV dysfunction.     He has had multiple hospitalizations over past 2 years for CHF exacerbation. Echo 4/21 showed normal LVEF 55-60% w/ G1DD. RV not well visualized, but felt to have component of RV failure. [place] 4/21 showed normal PA pressure and normal cardiac output. PFTs also completed and c/w mild obstructive airway disease.    In 7/21, he had a coronary CTA.  This showed no coronary calcium but there was a concern for soft plaque in the distal LCx and distal RCA with hemodynamically significant stenosis.  He was managed medically.   RHC (8/22) showed normal filling pressures & he underwent Cardiomems placement.  Admitted 3/23 with a/c dHF and worsening LE edema. Echo showed EF 60-65% RV normal He was diuresed with IV Lasix, down 20 lbs. LE venous dopplers negative DVT. Hospitalization complicated by severe  hypokalemia, discharged home on KCL 100 tid, weight 377.7 lbs. Cardiomems goal lowered to 9.   Admitted with 1-2 weeks of progressive volume overload, SOB and weakness. Weight up 15-20 pounds.   Labs showed sodium 138, potassium 3.9, bicarb 26, BUN 13, creatinine 0.96, serum glucose 136, AST 50, ALT 19, alk phos 88, total bilirubin 1.1, BNP 60.9, WBC 6.5, hemoglobin 9.4, platelets 137,000, troponin negative x 2. SARS-CoV-2 and influenza PCR negative. CXR no edema    Diuresing on IV lasix weight down 5 pounds overnight (400.4 -> 395.1) Says he is still SOB and orthopneic   Review of Systems: [y] = yes, _0  = no   General: Weight gain [ y]; Weight loss _1 ; Anorexia _2 ; Fatigue Blue.Reese ]; Fever _3 ;  Chills _4 ; Weakness [ y]  Cardiac: Chest pain/pressure _5 ; Resting SOB [ y]; Exertional SOB [ y]; Orthopnea _6 ; Pedal Edema [ y]; Palpitations _7 ; Syncope _8 ; Presyncope _9 ; Paroxysmal nocturnal dyspnea[y ]  Pulmonary: Cough _10 ; Wheezing_11 ; Hemoptysis_12 ; Sputum _13 ; Snoring _14   GI: Vomiting_15 ; Dysphagia_16 ; Melena_17 ; Hematochezia _18 ; Heartburn_19 ; Abdominal pain _20 ; Constipation _21 ; Diarrhea _22 ; BRBPR _23   GU: Hematuria_24 ; Dysuria _25 ; Nocturia_26   Vascular: Pain in legs with walking _27 ; Pain in feet with lying flat _28 ; Non-healing sores _29 ; Stroke _30 ; TIA _31 ; Slurred speech _32 ;  Neuro: Headaches_33 ; Vertigo_34 ; Seizures_35 ; Paresthesias_36 ;Blurred vision _37 ; Diplopia _38 ; Vision changes _39   Ortho/Skin: Arthritis Blue.Reese ]; Joint pain Blue.Reese ]; Muscle pain _40 ; Joint swelling _41 ; Back Pain _42 ; Rash _43   Psych: Depression[y ]; Anxiety[y ]  Heme: Bleeding problems _44 ; Clotting disorders _45 ; Anemia _46   Endocrine: Diabetes _47 ; Thyroid dysfunction_48   Home Medications Prior to Admission medications   Medication Sig Start Date End Date Taking? Authorizing Provider  apixaban (ELIQUIS) 5 MG TABS tablet Take 1 tablet (5 mg total) by mouth 2 (two) times daily. 11/19/21  Yes Evertte Sones, Shaune Pascal, MD  Buprenorphine HCl-Naloxone HCl 8-2 MG FILM Place 1 Film under the tongue in the morning and  at bedtime.   Yes [provider]  eplerenone (INSPRA) 50 MG tablet TAKE 2 TABLETS BY MOUTH DAILY Patient taking differently: Take 100 mg by mouth in the morning. 08/20/21  Yes Milford, Maricela Bo, FNP  gabapentin (NEURONTIN) 800 MG tablet Take 1 tablet (800 mg total) by mouth 3 (three) times daily. 03/23/19  Yes Angiulli, Lavon Paganini, PA-C  lactulose, encephalopathy, (CHRONULAC) 10 GM/15ML SOLN Take 30 mLs (20 g total) by mouth 3 (three) times daily. Patient taking differently: Take 33.33 g by mouth 2 (two) times daily. 07/17/21  Yes Arrien, Jimmy Picket, MD  metolazone (ZAROXOLYN) 2.5 MG  tablet Take 1 tablet (2.5 mg total) by mouth 3 (three) times a week. Monday, Wednesday and Fridays Patient taking differently: Take 2.5 mg by mouth every Monday, Wednesday, and Friday. 01/02/22  Yes Joette Catching, PA-C  metoprolol succinate (TOPROL XL) 25 MG 24 hr tablet Take 1 tablet (25 mg total) by mouth at bedtime. 11/19/21  Yes Thelmer Legler, Shaune Pascal, MD  ondansetron (ZOFRAN-ODT) 4 MG disintegrating tablet Take 4 mg by mouth every 8 (eight) hours as needed for nausea or vomiting (dissolve orally).   Yes [provider]  pantoprazole (PROTONIX) 40 MG tablet Take 40 mg by mouth 2 (two) times daily before a meal.   Yes [provider]  polyethylene glycol (MIRALAX / GLYCOLAX) 17 g packet Take 17 g by mouth as needed for mild constipation (mix and drink as directed).   Yes [provider]  potassium chloride SA (KLOR-CON M) 20 MEQ tablet Take 5 tablets (100 mEq total) by mouth 3 (three) times daily. Take one tablet extra (40 meq) with metolazone. Patient taking differently: Take 40-120 mEq by mouth See admin instructions. Take 120 mEq by mouth in the morning, 100 mEq at noontime, and 100 mEq in the evening. Take an additional 40 mEq by mouth on Mon/Wed/Fri when taking Metolazone. 08/06/21  Yes Larey Dresser, MD  pravastatin (PRAVACHOL) 20 MG tablet Take 1 tablet (20 mg total) by mouth daily. Patient taking differently: Take 20 mg by mouth at bedtime. 11/19/21  Yes Mikeyla Music, Shaune Pascal, MD  sertraline (ZOLOFT) 50 MG tablet Take 1 tablet (50 mg total) by mouth daily. 01/29/22  Yes Milford, Maricela Bo, FNP  tamsulosin (FLOMAX) 0.4 MG CAPS capsule Take 1 capsule (0.4 mg total) by mouth daily. Additional refills will need to come from PCP Patient taking differently: Take 0.4 mg by mouth daily. 11/19/21  Yes Meka Lewan, Shaune Pascal, MD  torsemide (DEMADEX) 100 MG tablet Take 1 tablet (100 mg total) by mouth twice daily. Patient taking differently: Take 100 mg by mouth See admin  instructions. Take 100 mg by mouth in the morning and evening 12/02/21  Yes Larey Dresser, MD  triamcinolone (KENALOG) 0.025 % ointment Apply 1 application. topically 2 (two) times daily. Patient taking differently: Apply 1 application  topically See admin instructions. Apply to both lower legs/feet 2 times a day 09/02/21  Yes Vu, Rockey Situ, MD  VENTOLIN HFA 108 (90 Base) MCG/ACT inhaler Inhale 1 puff into the lungs every 4 (four) hours as needed for shortness of breath. Patient taking differently: Inhale 2 puffs into the lungs every 4 (four) hours as needed for shortness of breath or wheezing. 03/23/19  Yes Angiulli, Lavon Paganini, PA-C  zolpidem (AMBIEN) 10 MG tablet Take 1 tablet (10 mg total) by mouth at bedtime as needed for sleep. Patient taking differently: Take 10 mg by mouth at bedtime. 12/23/20  Yes Adhikari, Tamsen Meek,  MD  bumetanide (BUMEX) 1 MG tablet Take 4 tablets (4 mg total) by mouth daily with breakfast AND 3 tablets (3 mg total) every evening. 05/10/20 06/13/20  Larey Dresser, MD  metoprolol tartrate (LOPRESSOR) 50 MG tablet Take 1 tablet (50 mg total) by mouth 2 (two) times daily. 02/09/20 04/11/20  Mercy Riding, MD    Past Medical History: Past Medical History:  Diagnosis Date   Acute on chronic congestive heart failure (HCC)    Acute on chronic diastolic CHF (congestive heart failure) ([place]) 08/23/2019   Acute on chronic right-sided heart failure ([place]) 08/06/2019   Acute respiratory failure with hypoxia (HCC)    Anemia of chronic disease    Atrial fibrillation ([place]) 12/23/2019   Atrial tachycardia ([place]) 08/12/2019   Benign essential HTN    Chest pain 08/23/2019   Chronic anticoagulation 12/26/2019   Chronic heart failure with preserved EF 05/30/2019   Echo 07/2019: EF 55-60, GR 1 DD   Chronic pain syndrome    Cirrhosis of liver without ascites ([place])    Class 2 severe obesity due to excess calories with serious comorbidity and body mass index (BMI) of 39.0 to 39.9 in adult ([place])  05/30/2019   Debility 03/13/2019   Drug induced constipation    Dyspnea    Fatty liver disease, nonalcoholic    GIB (gastrointestinal bleeding) 03/04/2019   Heart failure, systolic, acute ([place]) 10/08/3149   History of pulmonary embolism 08/12/2019   Hyperlipidemia 01/24/2019   Hypertension 01/24/2019   Hypoalbuminemia due to protein-calorie malnutrition (HCC)    Hypokalemia    Hyponatremia    Hypotension due to drugs    Insomnia 01/24/2019   Lactic acidosis 08/23/2019   Lumbar disc herniation 01/24/2019   Mixed hyperlipidemia 01/24/2019   Morbid obesity with BMI of 40.0-44.9, adult ([place]) 05/30/2019   Normocytic anemia 06/08/2019   Occasional tremors 08/12/2019   OSA (obstructive sleep apnea) 08/12/2019   Palpitations 12/26/2019   Peripheral edema    Personal history of DVT (deep vein thrombosis) 12/26/2019   Portal hypertensive gastropathy ([place]) 03/06/2019   Seen on EGD 03/06/2019   Pulmonary emboli ([place]) 06/08/2019   Pulmonary embolism ([place]) 06/08/2019   Redness and swelling of lower leg 08/23/2019   Shock ([place])    Thrombocytopenia ([place])     Past Surgical History: Past Surgical History:  Procedure Laterality Date   BIOPSY  04/08/2021   Procedure: BIOPSY;  Surgeon: Ladene Artist, MD;  Location: [facility] ENDOSCOPY;  Service: Endoscopy;;   ESOPHAGOGASTRODUODENOSCOPY N/A 04/08/2021   Procedure: ESOPHAGOGASTRODUODENOSCOPY (EGD);  Surgeon: Ladene Artist, MD;  Location[facility] ENDOSCOPY;  Service: Endoscopy;  Laterality: N/A;   ESOPHAGOGASTRODUODENOSCOPY (EGD) WITH PROPOFOL N/A 03/05/2019   Procedure: ESOPHAGOGASTRODUODENOSCOPY (EGD) WITH PROPOFOL;  Surgeon: Doran Stabler, MD;  Location: [place];  Service: Gastroenterology;  Laterality: N/A;  Large Blood Clot Removed   ESOPHAGOGASTRODUODENOSCOPY (EGD) WITH PROPOFOL N/A 03/06/2019   Procedure: ESOPHAGOGASTRODUODENOSCOPY (EGD) WITH PROPOFOL;  Surgeon: Thornton Park, MD;  Location: [place];  Service: Gastroenterology;  Laterality:  N/A;   HEMORROIDECTOMY     PRESSURE SENSOR/CARDIOMEMS N/A 11/14/2020   Procedure: PRESSURE SENSOR/CARDIOMEMS;  Surgeon: Larey Dresser, MD;  Location: [place] CV LAB;  Service: Cardiovascular;  Laterality: N/A;   RIGHT HEART CATH N/A 08/09/2019   Procedure: RIGHT HEART CATH;  Surgeon: Larey Dresser, MD;  Location: [place] CV LAB;  Service: Cardiovascular;  Laterality: N/A;   RIGHT HEART CATH N/A 11/01/2020   Procedure: RIGHT HEART CATH;  Surgeon: Larey Dresser, MD;  Location: [place] CV LAB;  Service: Cardiovascular;  Laterality: N/A;   RIGHT HEART CATH N/A 11/14/2020   Procedure: RIGHT HEART CATH;  Surgeon: Larey Dresser, MD;  Location: [place] CV LAB;  Service: Cardiovascular;  Laterality: N/A;   RIGHT/LEFT HEART CATH AND CORONARY ANGIOGRAPHY N/A 05/10/2020   Procedure: RIGHT/LEFT HEART CATH AND CORONARY ANGIOGRAPHY;  Surgeon: Larey Dresser, MD;  Location: [place] CV LAB;  Service: Cardiovascular;  Laterality: N/A;    Family History: Family History  Problem Relation Age of Onset   Hyperlipidemia Mother    Hypertension Mother    Stroke Mother    CAD Maternal Grandmother    CAD Maternal Grandfather     Social History: Social History   Socioeconomic History   Marital status: Single    Spouse name: Not on file   Number of children: Not on file   Years of education: Not on file   Highest education level: Not on file  Occupational History   Not on file  Tobacco Use   Smoking status: Never   Smokeless tobacco: Never  Vaping Use   Vaping Use: Never used  Substance and Sexual Activity   Alcohol use: Not Currently   Drug use: Not Currently    Types: Marijuana    Comment: Smoked for 30 years   Sexual activity: Not on file  Other Topics Concern   Not on file  Social History Narrative   Not on file   Social Determinants of Health   Financial Resource Strain: Medium Risk (11/15/2020)   Overall Financial Resource Strain (CARDIA)    Difficulty of  Paying Living Expenses: Somewhat hard  Food Insecurity: No Food Insecurity (11/15/2020)   Hunger Vital Sign    Worried About Running Out of Food in the Last Year: Never true    Ran Out of Food in the Last Year: Never true  Transportation Needs: Unmet Transportation Needs (02/23/2022)   PRAPARE - Hydrologist (Medical): Yes    Lack of Transportation (Non-Medical): Yes  Physical Activity: Not on file  Stress: Not on file  Social Connections: Not on file    Allergies:  Allergies  Allergen Reactions   Penicillins Other (See Comments)    Caused convulsions as a child  Did it involve swelling of the face/tongue/throat, SOB, or low BP? N/A Did it involve sudden or severe rash/hives, skin peeling, or any reaction on the inside of your mouth or nose? N/A Did you need to seek medical attention at a hospital or doctor's office?N/A When did it last happen? N/A If all above answers are "NO", may proceed with cephalosporin use.    Objective:    Vital Signs:   Temp:  [97.7 F (36.5 C)-98 F (36.7 C)] 97.9 F (36.6 C) (11/23 1100) Pulse Rate:  [65-100] 65 (11/23 1100) Resp:  [17-22] 18 (11/23 1100) BP: (95-147)/(55-84) 95/55 (11/23 1100) SpO2:  [97 %-100 %] 97 % (11/23 1100) Weight:  [179.2 kg-181.6 kg] 179.2 kg (11/23 0432)    Weight change: Filed Weights   03/05/22 0011 03/05/22 0432  Weight: (!) 181.6 kg (!) 179.2 kg    Intake/Output:   Intake/Output Summary (Last 24 hours) at 03/05/2022 1111 Last data filed at 03/05/2022 0835 Gross per 24 hour  Intake 540 ml  Output 2650 ml  Net -2110 ml      Physical Exam    General:  Sitting up on side of bed  HEENT: normal Neck: supple. JVP to jaw. Carotids 2+ bilat; no bruits. No lymphadenopathy or thyromegaly appreciated. Cor: PMI nondisplaced. Regular rate & rhythm. No rubs, gallops or murmurs. Lungs: clear Abdomen: obese  soft, nontender, nondistended. No hepatosplenomegaly. No bruits or masses.  Good bowel sounds. Extremities: no cyanosis, clubbing, rash, 2+ chronic edema with venous stasis changes Neuro: alert & orientedx3, cranial nerves grossly intact. moves all 4 extremities w/o difficulty. Affect pleasant   Telemetry   Sinus 70-90  Personally reviewed  EKG    SR 98 1AVB (276m) No ST-T wave abnormalities. Personally reviewed  Labs   Basic Metabolic Panel: Recent Labs  Lab 03/04/22 1516 03/05/22 0023  NA 138 137  K 3.9 3.3*  CL 97* 97*  CO2 26 28  GLUCOSE 136* 137*  BUN 13 12  CREATININE 0.96 1.09  CALCIUM 9.2 8.9  MG  --  2.1    Liver Function Tests: Recent Labs  Lab 03/04/22 1516 03/05/22 0023  AST 50* 52*  ALT 19 21  ALKPHOS 88 89  BILITOT 1.1 1.2  PROT 7.4 7.6  ALBUMIN 3.3* 3.4*   No results for input(s): "LIPASE", "AMYLASE" in the last 168 hours. No results for input(s): "AMMONIA" in the last 168 hours.  CBC: Recent Labs  Lab 03/04/22 1516 03/05/22 0023  WBC 6.5 6.9  NEUTROABS 5.4  --   HGB 9.4* 9.4*  HCT 33.4* 32.5*  MCV 74.6* 72.9*  PLT 137* 145*    Cardiac Enzymes: No results for input(s): "CKTOTAL", "CKMB", "CKMBINDEX", "TROPONINI" in the last 168 hours.  BNP: BNP (last 3 results) Recent Labs    11/21/21 1025 12/10/21 1414 03/04/22 1516  BNP 24.3 35.9 60.9    ProBNP (last 3 results) No results for input(s): "PROBNP" in the last 8760 hours.   CBG: No results for input(s): "GLUCAP" in the last 168 hours.  Coagulation Studies: No results for input(s): "LABPROT", "INR" in the last 72 hours.   Imaging   DG Chest 2 View  Result Date: 03/04/2022 CLINICAL DATA:  Generalized weakness and malaise for 3 days. History of congestive heart failure and COPD. Chest pain. EXAM: CHEST - 2 VIEW COMPARISON:  07/07/2021 FINDINGS: Heart size and pulmonary vascularity are normal. Surgical clip over the left hilum. Emphysematous changes in the lungs. No airspace disease or consolidation. No pleural effusions. No pneumothorax.  Mediastinal contours appear intact. IMPRESSION: Emphysematous changes in the lungs. No evidence of active pulmonary disease. Electronically Signed   By: WLucienne CapersM.D.   On: 03/04/2022 16:12     Medications:     Current Medications:  apixaban  5 mg Oral BID   furosemide  80 mg Intravenous Q12H   lactulose  33.33 g Oral BID   metoprolol succinate  25 mg Oral QHS   potassium chloride  40 mEq Oral BID   pravastatin  20 mg Oral QHS   sertraline  50 mg Oral Daily   sodium chloride flush  3 mL Intravenous Q12H   spironolactone  25 mg Oral Daily   tamsulosin  0.4 mg Oral Daily    Infusions:    Assessment/Plan   1. Acute on chronic heart failure with preserved ejection fraction (HFpEF) ([place] - Presenting with progressive weight gain, DOE, PND, orthopnea, lower extremity edema.  BNP unreliable in setting of super morbid obesity.  Last EF 60-65% 07/09/2021. -Continue IV Lasix 80 mg twice daily (weight now 395. Last d/c was 377). Refuses metolazone -Continue spironolactone 25 mg daily (pharmacy equivalent for  home eplerenone) -Continue Toprol-XL 25 mg daily -Strict I/O's, Daily weights - Place UNNA boots   2. Paroxysmal atrial fibrillation (HCC) - Sinus rhythm on admission. -Continue Toprol-XL and Eliquis   3. COPD (chronic obstructive pulmonary disease) (HCC) No wheezing on admission.  Continue albuterol as needed.   4. Essential hypertension - BP controlled Continue Toprol-XL and spironolactone.   5. OSA (obstructive sleep apnea) - Not using CPAP due to reported intolerance.   6. Cirrhosis of liver without ascites (HCC) Stable, no sign of hepatic encephalopathy. -Continue spironolactone -Continue lactulose   7. Anemia of chronic disease - Hemoglobin stable at 9.4 - MCV low.  - Check iron stores   8. Hypokalemia - supp K  Length of Stay: 1  Glori Bickers, MD  03/05/2022, 11:11 AM  Advanced Heart Failure Team Pager [phone] (M-F; 7a - 5p)  Please  contact [facility] for night-coverage after hours (4p -7a ) and weekends on [email or web address]

## 2022-03-06 ENCOUNTER — Inpatient Hospital Stay (HOSPITAL_COMMUNITY): Payer: Medicare HMO

## 2022-03-06 DIAGNOSIS — I5031 Acute diastolic (congestive) heart failure: Secondary | ICD-10-CM | POA: Diagnosis not present

## 2022-03-06 DIAGNOSIS — I5033 Acute on chronic diastolic (congestive) heart failure: Secondary | ICD-10-CM | POA: Diagnosis not present

## 2022-03-06 LAB — IRON AND TIBC
Iron: 37 ug/dL — ABNORMAL LOW (ref 45–182)
Saturation Ratios: 6 % — ABNORMAL LOW (ref 17.9–39.5)
TIBC: 613 ug/dL — ABNORMAL HIGH (ref 250–450)
UIBC: 576 ug/dL

## 2022-03-06 LAB — COMPREHENSIVE METABOLIC PANEL
ALT: 27 U/L (ref 0–44)
AST: 61 U/L — ABNORMAL HIGH (ref 15–41)
Albumin: 3.5 g/dL (ref 3.5–5.0)
Alkaline Phosphatase: 86 U/L (ref 38–126)
Anion gap: 11 (ref 5–15)
BUN: 9 mg/dL (ref 8–23)
CO2: 27 mmol/L (ref 22–32)
Calcium: 8.8 mg/dL — ABNORMAL LOW (ref 8.9–10.3)
Chloride: 100 mmol/L (ref 98–111)
Creatinine, Ser: 0.89 mg/dL (ref 0.61–1.24)
GFR, Estimated: >60 mL/min (ref >60)
Glucose, Bld: 122 mg/dL — ABNORMAL HIGH (ref 70–99)
Potassium: 3.1 mmol/L — ABNORMAL LOW (ref 3.5–5.1)
Sodium: 138 mmol/L (ref 135–145)
Total Bilirubin: 1.2 mg/dL (ref 0.3–1.2)
Total Protein: 8.1 g/dL (ref 6.5–8.1)

## 2022-03-06 LAB — RETICULOCYTES
Immature Retic Fract: 21.3 % — ABNORMAL HIGH (ref 2.3–15.9)
RBC.: 4.54 MIL/uL (ref 4.22–5.81)
Retic Count, Absolute: 65.8 10*3/uL (ref 19.0–186.0)
Retic Ct Pct: 1.5 % (ref 0.4–3.1)

## 2022-03-06 LAB — FOLATE: Folate: 30.6 ng/mL (ref >5.9)

## 2022-03-06 LAB — ECHOCARDIOGRAM COMPLETE
Area-P 1/2: 3.3 cm2
Calc EF: 45 %
Single Plane A2C EF: 29.2 %
Single Plane A4C EF: 56.7 %
Weight: 6275.2 oz

## 2022-03-06 LAB — MAGNESIUM: Magnesium: 2.1 mg/dL (ref 1.7–2.4)

## 2022-03-06 LAB — VITAMIN B12: Vitamin B-12: 431 pg/mL (ref 180–914)

## 2022-03-06 LAB — FERRITIN: Ferritin: 20 ng/mL — ABNORMAL LOW (ref 24–336)

## 2022-03-06 MED ORDER — SODIUM CHLORIDE 0.9 % IV SOLN
510.0000 mg | Freq: Once | INTRAVENOUS | Status: AC
  Administered 2022-03-06: 510 mg via INTRAVENOUS
  Filled 2022-03-06: qty 17

## 2022-03-06 MED ORDER — POTASSIUM CHLORIDE CRYS ER 20 MEQ PO TBCR
60.0000 meq | EXTENDED_RELEASE_TABLET | Freq: Once | ORAL | Status: AC
  Administered 2022-03-06: 60 meq via ORAL
  Filled 2022-03-06: qty 3

## 2022-03-06 MED ORDER — FUROSEMIDE 10 MG/ML IJ SOLN
120.0000 mg | Freq: Two times a day (BID) | INTRAVENOUS | Status: DC
  Administered 2022-03-06 – 2022-03-11 (×9): 120 mg via INTRAVENOUS
  Filled 2022-03-06: qty 2
  Filled 2022-03-06: qty 10
  Filled 2022-03-06: qty 12
  Filled 2022-03-06 (×2): qty 10
  Filled 2022-03-06 (×3): qty 12
  Filled 2022-03-06 (×2): qty 10
  Filled 2022-03-06: qty 2

## 2022-03-06 MED ORDER — ZOLPIDEM TARTRATE 5 MG PO TABS
10.0000 mg | ORAL_TABLET | Freq: Every evening | ORAL | Status: DC | PRN
  Administered 2022-03-06 – 2022-03-10 (×5): 10 mg via ORAL
  Filled 2022-03-06 (×5): qty 2

## 2022-03-06 MED ORDER — POTASSIUM CHLORIDE CRYS ER 20 MEQ PO TBCR
40.0000 meq | EXTENDED_RELEASE_TABLET | Freq: Two times a day (BID) | ORAL | Status: DC
  Administered 2022-03-06: 40 meq via ORAL
  Filled 2022-03-06: qty 2

## 2022-03-06 MED ORDER — POTASSIUM CHLORIDE CRYS ER 20 MEQ PO TBCR
60.0000 meq | EXTENDED_RELEASE_TABLET | Freq: Two times a day (BID) | ORAL | Status: AC
  Administered 2022-03-06: 60 meq via ORAL
  Filled 2022-03-06: qty 3

## 2022-03-06 MED ORDER — PERFLUTREN LIPID MICROSPHERE
1.0000 mL | INTRAVENOUS | Status: AC | PRN
  Administered 2022-03-06: 2 mL via INTRAVENOUS

## 2022-03-06 NOTE — Progress Notes (Addendum)
PROGRESS NOTE    Charles Gray  RJJ:[id number] DOB: 1956-03-17 DOA: 03/04/2022 PCP: Enid Skeens., MD  21 /M with history of HFpEF (EF 60-65%), PAF on Eliquis, history of PE/DVT, COPD, cirrhosis, HTN, HLD, iron deficiency anemia, GAD, OSA nonadherent to CPAP who presented to the ED for evaluation of dyspnea and lower extremity edema x 10days, 15lb weight gain In ED, labs BNP of 60, creat 0.9, CXR w/ emphysema -He is followed by advanced heart failure team and para medicine  Subjective: Feels poorly, has nausea, short of breath, weak   Assessment and Plan:  Acute on chronic diastolic CHF 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  Also has a component of chronic venous insufficiency - Last echo w/  EF 60-65% 07/09/2021. -Diuresing on IV Lasix, he is 2.3 L negative, weight down 8 lbs, continue IV Lasix 80 mg twice daily, aldactone, toprol -Reportedly intolerant to Jardiance(GI symptoms) -Continue UNNA boots -Check limited ultrasound, if ascites present would benefit from paracentesis -Follow-up repeat echocardiogram, assess RV -Strict I/O's, Daily weights  Paroxysmal atrial fibrillation (HCC) Sinus rhythm on admission. -Continue Toprol-XL and Eliquis  History of DVT/PE -Continue Eliquis  COPD (chronic obstructive pulmonary disease) ([place]) -Continue as needed albuterol  Obesity OSA (obstructive sleep apnea) -Reportedly intolerant to CPAP  Cirrhosis of liver without ascites (HCC) -Diuretics as above -Continue  lactulose  Anemia of chronic disease Iron deficiency anemia Hemoglobin stable at 9.4. -Iron stores are low, will give IV Feraheme  Mixed hyperlipidemia Continue pravastatin.  Anxiety -Continue sertraline  Chronic Suboxone use -Resumed  OSA -Reportedly cannot tolerate CPAP, counseled, will reattempt   DVT prophylaxis: apixaban Code Status: FUll code Family Communication: None  present Disposition Plan: Home likely 3 to 4 days  Consultants: CHF team   Procedures:   Antimicrobials:    Objective: Vitals:   03/06/22 0015 03/06/22 0528 03/06/22 0532 03/06/22 0745  BP: 110/68 127/74  136/77  Pulse: 70 65  70  Resp: 19 13  14   Temp: 98.1 F (36.7 C)   98 F (36.7 C)  TempSrc: Oral   Axillary  SpO2: 99% 100%  100%  Weight:   (!) 177.9 kg     Intake/Output Summary (Last 24 hours) at 03/06/2022 0950 Last data filed at 03/06/2022 0160 Gross per 24 hour  Intake 720 ml  Output 1300 ml  Net -580 ml   Filed Weights   03/05/22 0011 03/05/22 0432 03/06/22 0532  Weight: (!) 181.6 kg (!) 179.2 kg (!) 177.9 kg    Examination:  General exam: Obese male sitting up in bed, AAOx3, no distress HEENT: Neck obese unable to assess JVD CVS: S1-S2, regular rhythm Lungs: Clear bilaterally Abdomen: Soft, nontender, bowel sounds present Extremities: 2+ edema with chronic skin changes  Psychiatry:  Mood & affect appropriate.     Data Reviewed:   CBC: Recent Labs  Lab 03/04/22 1516 03/05/22 0023  WBC 6.5 6.9  NEUTROABS 5.4  --   HGB 9.4* 9.4*  HCT 33.4* 32.5*  MCV 74.6* 72.9*  PLT 137* 109*   Basic Metabolic Panel: Recent Labs  Lab 03/04/22 1516 03/05/22 0023  NA 138 137  K 3.9 3.3*  CL 97* 97*  CO2 26 28  GLUCOSE 136* 137*  BUN 13 12  CREATININE 0.96 1.09  CALCIUM 9.2 8.9  MG  --  2.1   GFR: Estimated Creatinine Clearance: 124 mL/min (by C-G formula based on SCr of 1.09 mg/dL). Liver Function Tests: Recent  Labs  Lab 03/04/22 1516 03/05/22 0023  AST 50* 52*  ALT 19 21  ALKPHOS 88 89  BILITOT 1.1 1.2  PROT 7.4 7.6  ALBUMIN 3.3* 3.4*   No results for input(s): "LIPASE", "AMYLASE" in the last 168 hours. No results for input(s): "AMMONIA" in the last 168 hours. Coagulation Profile: No results for input(s): "INR", "PROTIME" in the last 168 hours. Cardiac Enzymes: No results for input(s): "CKTOTAL", "CKMB", "CKMBINDEX", "TROPONINI"  in the last 168 hours. BNP (last 3 results) No results for input(s): "PROBNP" in the last 8760 hours. HbA1C: No results for input(s): "HGBA1C" in the last 72 hours. CBG: No results for input(s): "GLUCAP" in the last 168 hours. Lipid Profile: No results for input(s): "CHOL", "HDL", "LDLCALC", "TRIG", "CHOLHDL", "LDLDIRECT" in the last 72 hours. Thyroid Function Tests: No results for input(s): "TSH", "T4TOTAL", "FREET4", "T3FREE", "THYROIDAB" in the last 72 hours. Anemia Panel: Recent Labs    03/06/22 0025  VITAMINB12 431  FOLATE 30.6  FERRITIN 20*  TIBC 613*  IRON 37*  RETICCTPCT 1.5   Urine analysis:    Component Value Date/Time   COLORURINE YELLOW 03/04/2022 2000   APPEARANCEUR CLEAR 03/04/2022 2000   LABSPEC 1.010 03/04/2022 2000   PHURINE 8.0 03/04/2022 2000   GLUCOSEU NEGATIVE 03/04/2022 2000   HGBUR NEGATIVE 03/04/2022 2000   BILIRUBINUR NEGATIVE 03/04/2022 2000   KETONESUR NEGATIVE 03/04/2022 2000   PROTEINUR NEGATIVE 03/04/2022 2000   NITRITE NEGATIVE 03/04/2022 2000   LEUKOCYTESUR NEGATIVE 03/04/2022 2000   Sepsis Labs: @LABRCNTIP (procalcitonin:4,lacticidven:4)  ) Recent Results (from the past 240 hour(s))  Resp Panel by RT-PCR (Flu A&B, Covid) Anterior Nasal Swab     Status: None   Collection Time: 03/04/22  3:33 PM   Specimen: Anterior Nasal Swab  Result Value Ref Range Status   SARS Coronavirus 2 by RT PCR NEGATIVE NEGATIVE Final    Comment: (NOTE) SARS-CoV-2 target nucleic acids are NOT DETECTED.  The SARS-CoV-2 RNA is generally detectable in upper respiratory specimens during the acute phase of infection. The lowest concentration of SARS-CoV-2 viral copies this assay can detect is 138 copies/mL. A negative result does not preclude SARS-Cov-2 infection and should not be used as the sole basis for treatment or other patient management decisions. A negative result may occur with  improper specimen collection/handling, submission of specimen  other than nasopharyngeal swab, presence of viral mutation(s) within the areas targeted by this assay, and inadequate number of viral copies(<138 copies/mL). A negative result must be combined with clinical observations, patient history, and epidemiological information. The expected result is Negative.  Fact Sheet for Patients:  EntrepreneurPulse.com.au  Fact Sheet for Healthcare Providers:  IncredibleEmployment.be  This test is no t yet approved or cleared by the [place] FDA and  has been authorized for detection and/or diagnosis of SARS-CoV-2 by FDA under an Emergency Use Authorization (EUA). This EUA will remain  in effect (meaning this test can be used) for the duration of the COVID-19 declaration under Section 564(b)(1) of the Act, 21 U.S.C.section 360bbb-3(b)(1), unless the authorization is terminated  or revoked sooner.       Influenza A by PCR NEGATIVE NEGATIVE Final   Influenza B by PCR NEGATIVE NEGATIVE Final    Comment: (NOTE) The Xpert Xpress SARS-CoV-2/FLU/RSV plus assay is intended as an aid in the diagnosis of influenza from Nasopharyngeal swab specimens and should not be used as a sole basis for treatment. Nasal washings and aspirates are unacceptable for Xpert Xpress SARS-CoV-2/FLU/RSV testing.  Fact Sheet for  Patients: EntrepreneurPulse.com.au  Fact Sheet for Healthcare Providers: IncredibleEmployment.be  This test is not yet approved or cleared by the [place] FDA and has been authorized for detection and/or diagnosis of SARS-CoV-2 by FDA under an Emergency Use Authorization (EUA). This EUA will remain in effect (meaning this test can be used) for the duration of the COVID-19 declaration under Section 564(b)(1) of the Act, 21 U.S.C. section 360bbb-3(b)(1), unless the authorization is terminated or revoked.  Performed at [facility], [place] [address] [place],  [place] [address]      Radiology Studies: DG Chest 2 View  Result Date: 03/04/2022 CLINICAL DATA:  Generalized weakness and malaise for 3 days. History of congestive heart failure and COPD. Chest pain. EXAM: CHEST - 2 VIEW COMPARISON:  07/07/2021 FINDINGS: Heart size and pulmonary vascularity are normal. Surgical clip over the left hilum. Emphysematous changes in the lungs. No airspace disease or consolidation. No pleural effusions. No pneumothorax. Mediastinal contours appear intact. IMPRESSION: Emphysematous changes in the lungs. No evidence of active pulmonary disease. Electronically Signed   By: Lucienne Capers M.D.   On: 03/04/2022 16:12     Scheduled Meds:  apixaban  5 mg Oral BID   buprenorphine-naloxone  1 tablet Sublingual Daily   furosemide  80 mg Intravenous Q12H   lactulose  33.33 g Oral BID   metoprolol succinate  25 mg Oral QHS   potassium chloride  40 mEq Oral BID   pravastatin  20 mg Oral QHS   sertraline  50 mg Oral Daily   sodium chloride flush  3 mL Intravenous Q12H   spironolactone  25 mg Oral Daily   tamsulosin  0.4 mg Oral Daily   Continuous Infusions:   LOS: 2 days    Time spent: 33mn  PDomenic Polite MD [facility]   03/06/2022, 9:50 AM

## 2022-03-06 NOTE — Discharge Instructions (Signed)

## 2022-03-06 NOTE — Progress Notes (Signed)
   03/06/22 1439  Mobility  Activity Ambulated with assistance in room  Level of Assistance Modified independent, requires aide device or extra time  Assistive Device Front wheel walker  Distance Ambulated (ft) 60 ft  Activity Response Tolerated well  Mobility Referral Yes  $Mobility charge 1 Mobility   Mobility Specialist Progress Note  Pre-Mobility: 96% SpO2 During Mobility: 94% SpO2 Post-Mobility: 95% SpO2  Pt was in bed and agreeable. Had no c/o pain throughout ambulation. Returned to bed w/all needs met and call bell in reach.   [name] Mobility Specialist

## 2022-03-06 NOTE — Progress Notes (Addendum)
Advanced Heart Failure Rounding Note  PCP-Cardiologist: Loralie Champagne, MD   Subjective:     Diuresing with IV lasix. Is/Os not complete. Has trouble holding urine at times. Reports good output. Weight down 3 lb.   No metabolic panel this am.  No dyspnea at rest. Winded with minimal activity.   Echo pending.  Objective:   Weight Range: (!) 177.9 kg Body mass index is 41.01 kg/m.   Vital Signs:   Temp:  [97.9 F (36.6 C)-98.1 F (36.7 C)] 98 F (36.7 C) (11/24 0745) Pulse Rate:  [65-82] 70 (11/24 0745) Resp:  [13-20] 14 (11/24 0745) BP: (95-136)/(55-77) 136/77 (11/24 0745) SpO2:  [95 %-100 %] 100 % (11/24 0745) Weight:  [177.9 kg] 177.9 kg (11/24 0532) Last BM Date : 03/05/22  Weight change: Filed Weights   03/05/22 0011 03/05/22 0432 03/06/22 0532  Weight: (!) 181.6 kg (!) 179.2 kg (!) 177.9 kg    Intake/Output:   Intake/Output Summary (Last 24 hours) at 03/06/2022 0923 Last data filed at 03/06/2022 9371 Gross per 24 hour  Intake 717 ml  Output 1300 ml  Net -583 ml      Physical Exam    General:  Sitting up on side of bed. No distress. HEENT: Normal Neck: Supple. JVP difficult d/t thick neck. Carotids 2+ bilat; no bruits.  Cor: PMI nondisplaced. Regular rate & rhythm. No rubs, gallops or murmurs. Lungs: Diminished Abdomen: morbidly obese, soft, nontender, nondistended.  Extremities: No cyanosis, clubbing, rash, 3+ edema, + UNNA Neuro: Alert & orientedx3, cranial nerves grossly intact. moves all 4 extremities w/o difficulty. Affect pleasant   Telemetry   SR 60s-70s  Labs    CBC Recent Labs    03/04/22 1516 03/05/22 0023  WBC 6.5 6.9  NEUTROABS 5.4  --   HGB 9.4* 9.4*  HCT 33.4* 32.5*  MCV 74.6* 72.9*  PLT 137* 696*   Basic Metabolic Panel Recent Labs    03/04/22 1516 03/05/22 0023  NA 138 137  K 3.9 3.3*  CL 97* 97*  CO2 26 28  GLUCOSE 136* 137*  BUN 13 12  CREATININE 0.96 1.09  CALCIUM 9.2 8.9  MG  --  2.1   Liver  Function Tests Recent Labs    03/04/22 1516 03/05/22 0023  AST 50* 52*  ALT 19 21  ALKPHOS 88 89  BILITOT 1.1 1.2  PROT 7.4 7.6  ALBUMIN 3.3* 3.4*   No results for input(s): "LIPASE", "AMYLASE" in the last 72 hours. Cardiac Enzymes No results for input(s): "CKTOTAL", "CKMB", "CKMBINDEX", "TROPONINI" in the last 72 hours.  BNP: BNP (last 3 results) Recent Labs    11/21/21 1025 12/10/21 1414 03/04/22 1516  BNP 24.3 35.9 60.9    ProBNP (last 3 results) No results for input(s): "PROBNP" in the last 8760 hours.   D-Dimer No results for input(s): "DDIMER" in the last 72 hours. Hemoglobin A1C No results for input(s): "HGBA1C" in the last 72 hours. Fasting Lipid Panel No results for input(s): "CHOL", "HDL", "LDLCALC", "TRIG", "CHOLHDL", "LDLDIRECT" in the last 72 hours. Thyroid Function Tests No results for input(s): "TSH", "T4TOTAL", "T3FREE", "THYROIDAB" in the last 72 hours.  Invalid input(s): "FREET3"  Other results:   Imaging    No results found.   Medications:     Scheduled Medications:  apixaban  5 mg Oral BID   buprenorphine-naloxone  1 tablet Sublingual Daily   furosemide  80 mg Intravenous Q12H   lactulose  33.33 g Oral BID   metoprolol  succinate  25 mg Oral QHS   potassium chloride  40 mEq Oral BID   pravastatin  20 mg Oral QHS   sertraline  50 mg Oral Daily   sodium chloride flush  3 mL Intravenous Q12H   spironolactone  25 mg Oral Daily   tamsulosin  0.4 mg Oral Daily    Infusions:  ferumoxytol (FERAHEME) 510 mg in sodium chloride 0.9 % 100 mL IVPB      PRN Medications: acetaminophen **OR** acetaminophen, albuterol, ondansetron **OR** ondansetron (ZOFRAN) IV, senna-docusate, zolpidem   Assessment/Plan   1. Acute on chronic heart failure with preserved ejection fraction (HFpEF) ([place]) - Presenting with progressive weight gain, DOE, PND, orthopnea, lower extremity edema.  BNP unreliable in setting of super morbid obesity.  - Weight 395  lb on admit, 377 lb last discharge. Not sure all weight gain d/t CHF. On chart review, appears had been gaining weight despite dPAP near goal. - Last EF 60-65% 07/09/2021. - Repeat echo pending. - Weight down 3 lb.  Increase IV lasix to 120 BID. Refuses metolazone, makes him feel poorly. - Continue spironolactone 25 mg daily (pharmacy equivalent for home eplerenone) - Continue Toprol-XL 25 mg daily - Strict I/O's, daily weights - Continue UNNA boots - Check CMET   2. Paroxysmal atrial fibrillation (HCC) - Sinus rhythm on admission. - Continue Toprol-XL and Eliquis   3. COPD (chronic obstructive pulmonary disease) (HCC) - No wheezing on admission.   - Continue albuterol as needed.   4. Essential hypertension - BP controlled  - Continue Toprol-XL and spironolactone.   5. OSA (obstructive sleep apnea) - Not using CPAP due to reported intolerance.   6. Cirrhosis of liver without ascites (HCC) - Stable, no sign of hepatic encephalopathy. - Continue spironolactone - Continue lactulose - Primary team checking US abdomen to look for ascites   7. Anemia of chronic disease - Hemoglobin stable at 9.4 - Iron stores low - Receiving feraheme   8. Hypokalemia - Check BMET - supp K   SDOH: -Has paramedicine in the community. Reports adherence with meds. However, does have history of missing doses of potassium and metolazone.  Length of Stay: 2  FIN[facility], Charles Parents, PA-C  03/06/2022, 9:23 AM  Advanced Heart Failure Team Pager [phone] (M-F; 7a - 5p)  Please contact [facility] for night-coverage after hours (5p -7a ) and weekends on [email or web address]  Patient seen and examined with the above-signed Advanced Practice Provider and/or Housestaff. I personally reviewed laboratory data, imaging studies and relevant notes. I independently examined the patient and formulated the important aspects of the plan. I have edited the note to reflect any of my changes or salient points. I have personally  discussed the plan with the patient and/or family.  Still SOB. Only modest diuresis with IV lasix. Scr stable. K low  General:  Sitting on side of bed  No resp difficulty HEENT: normal Neck: supple. JVP up Carotids 2+ bilat; no bruits. No lymphadenopathy or thryomegaly appreciated. Cor: PMI nondisplaced. Regular rate & rhythm. No rubs, gallops or murmurs. Lungs: clear Abdomen: obese soft, nontender, nondistended. No hepatosplenomegaly. No bruits or masses. Good bowel sounds. Extremities: no cyanosis, clubbing, rash, 2+ woody edema  + UNNA Neuro: alert & orientedx3, cranial nerves grossly intact. moves all 4 extremities w/o difficulty. Affect pleasant  Needs more diuresis. Refusing metolazone. Will increase lasix. Suspect some of weight gain is adipose. Supp K. Follow renal function.   Glori Bickers, MD  12:00 PM

## 2022-03-06 NOTE — Progress Notes (Signed)
  Echocardiogram 2D Echocardiogram has been performed.  Charles Gray 03/06/2022, 2:05 PM

## 2022-03-06 NOTE — TOC Progression Note (Signed)
Transition of Care [facility]) - Progression Note    Patient Details  Name: Charles Gray MRN: [id number] Date of Birth: 11-07-1955  [facility] CM/SW Contact  Zenon Mayo, RN Phone Number: 03/06/2022, 3:10 PM  Clinical Narrative:    from home , has a helper, fluid overload, conts on IV lasix, ? If IR should do parancetesis, but ultra sound did not show any fluid so it was canceled.  TOC following.         Expected Discharge Plan and Services                                                 Social Determinants of Health (SDOH) Interventions    Readmission Risk Interventions    09/26/2020    9:27 AM 06/12/2020    5:03 PM 03/27/2020   11:51 AM  Readmission Risk Prevention Plan  Transportation Screening Complete Complete Complete  Medication Review [employer] photographer) Complete Complete Complete  PCP or Specialist appointment within 3-5 days of discharge Complete  Complete  HRI or Home Care Consult Complete Complete Complete  SW Recovery Care/Counseling Consult Complete Complete Complete  Palliative Care Screening Not Applicable Not Applicable Not [place] Not Applicable Not Applicable Not Applicable

## 2022-03-06 NOTE — Progress Notes (Signed)
Nutrition Follow-up  DOCUMENTATION CODES:   Obesity unspecified  INTERVENTION:  Liberalize diet from a heart healthy to a 2 gram sodium diet to provide widest variety of menu options to enhance nutritional adequacy Double protein portions with meals Snacks TID between meals to optimize nutritional adequacy "Low Sodium Nutrition Therapy" handout added to AVS  NUTRITION DIAGNOSIS:   Inadequate oral intake related to poor appetite as evidenced by per patient/family report, meal completion < 50%  GOAL:   Patient will meet greater than or equal to 90% of their needs  MONITOR:   PO intake, Labs, Weight trends, I & O's  REASON FOR ASSESSMENT:   Consult Diet education  ASSESSMENT:   Pt admitted with acute on chronic CHF. PMH significant for HFpEF (EF 60-65%), PAF, h/o PE/SVT, COPD, cirrhosis, HTN, HLD, iron deficiency anemia, GAD, OSA.   Consult received for HF diet education.   Pt sitting on EOB at time of visit. Lunch tray was delivered at that time. He states that he has had a poor appetite lately as he has not felt well, likely secondary to SOB and fluid retention. He states that he gets sick when he eats and sick when he doesn't.   Meal completions: 11/23: 50% breakfast, 40% dinner  He has an aid that comes to his house daily for 3 hours per day. She will help prepare food for him if he feels up to eating. He often doesn't eat breakfast. He usually relies on convenience foods as he is unable to stand long to cook. He recalls trying to eat fresh fruits and vegetables when he can. He eats freezer meals and beef stew. He orders grocery delivery from [employer] and tries to compare sodium content of different items before ordering.   He used to drink sodas but now drinks water and juices. He reports that he probably drinks more fluid than he should but did not quantify how much he drinks on average daily.   Encouraged pt to continue to compare food items when ordering online to  minimize sodium as best as he can and monitor his fluid intake and daily weights.   Weight upon admission had increased 15lb within 10 days.   Edema: deep pitting BLE  Medications: lactulose, klor-con, IV lasix  Labs: potassium 3.1, AST 61  UOP: 534m x12 hours I/O's: -2.7L since admit  NUTRITION - FOCUSED PHYSICAL EXAM: No depletions noted. Deep pitting BLE edema present likely masking muscle losses.   Diet Order:   Diet Order             Diet Heart Room service appropriate? Yes; Fluid consistency: Thin  Diet effective now                   EDUCATION NEEDS:   Education needs have been addressed  Skin:  Skin Assessment: Reviewed RN Assessment  Last BM:  11/23  Height:   Ht Readings from Last 1 Encounters:  12/10/21 6' 10"  (2.083 m)    Weight:   Wt Readings from Last 1 Encounters:  03/06/22 (!) 177.9 kg    Ideal Body Weight:  108.2 kg  BMI:  Body mass index is 41.01 kg/m.  Estimated Nutritional Needs:   Kcal:  2600-2800  Protein:  130-145g  Fluid:  >/=2L  AClayborne Dana RDN, LDN Clinical Nutrition

## 2022-03-07 DIAGNOSIS — I5033 Acute on chronic diastolic (congestive) heart failure: Secondary | ICD-10-CM | POA: Diagnosis not present

## 2022-03-07 LAB — BASIC METABOLIC PANEL
Anion gap: 11 (ref 5–15)
BUN: 11 mg/dL (ref 8–23)
CO2: 27 mmol/L (ref 22–32)
Calcium: 8.8 mg/dL — ABNORMAL LOW (ref 8.9–10.3)
Chloride: 99 mmol/L (ref 98–111)
Creatinine, Ser: 1.01 mg/dL (ref 0.61–1.24)
GFR, Estimated: >60 mL/min (ref >60)
Glucose, Bld: 121 mg/dL — ABNORMAL HIGH (ref 70–99)
Potassium: 4.1 mmol/L (ref 3.5–5.1)
Sodium: 137 mmol/L (ref 135–145)

## 2022-03-07 LAB — MAGNESIUM: Magnesium: 2.2 mg/dL (ref 1.7–2.4)

## 2022-03-07 MED ORDER — GABAPENTIN 100 MG PO CAPS
200.0000 mg | ORAL_CAPSULE | Freq: Two times a day (BID) | ORAL | Status: AC
  Administered 2022-03-07 (×2): 200 mg via ORAL
  Filled 2022-03-07 (×2): qty 2

## 2022-03-07 MED ORDER — COLCHICINE 0.6 MG PO TABS
0.6000 mg | ORAL_TABLET | Freq: Every day | ORAL | Status: DC
  Filled 2022-03-07 (×3): qty 1

## 2022-03-07 NOTE — Plan of Care (Signed)
  Problem: Education: Goal: Ability to demonstrate management of disease process will improve Outcome: Progressing Goal: Ability to verbalize understanding of medication therapies will improve Outcome: Progressing Educated patient on new medicates and and pt declines anti gout medications   Problem: Activity: Goal: Capacity to carry out activities will improve Outcome: Progressing  Walking the halls with mobility tech. Problem: Education: Goal: Knowledge of General Education information will improve Description: Including pain rating scale, medication(s)/side effects and non-pharmacologic comfort measures Outcome: Progressing   Problem: Nutrition: Goal: Adequate nutrition will be maintained Outcome: Progressing Good appetite    Problem: Coping: Goal: Level of anxiety will decrease Outcome: Progressing  Pt would like too set up transportation and Meals on wheels. Problem: Elimination: Goal: Will not experience complications related to bowel motility Outcome: Progressing Using to toliet unable to measure urine. Put a a ht and patient does not like the way genials feel after he urinates

## 2022-03-07 NOTE — Progress Notes (Signed)
Mobility Specialist Progress Note    03/07/22 1307  Mobility  Activity Ambulated with assistance in hallway  Level of Assistance Contact guard assist, steadying assist  Assistive Device Other (Comment) (eva walker)  Distance Ambulated (ft) 180 ft  Activity Response Tolerated fair  Mobility Referral Yes  $Mobility charge 1 Mobility   Pre-Mobility: 78 HR During Mobility: 89 HR  Pt received sitting EOB and agreeable. C/o bilateral foot pain, stating "it hurts real bad". Returned to sitting EOB with call bell in reach. RN aware.   Hildred Alamin Mobility Specialist  Please Psychologist, sport and exercise or Rehab Office at [phone]

## 2022-03-07 NOTE — Progress Notes (Signed)
Refuses cpap

## 2022-03-07 NOTE — Progress Notes (Signed)
PROGRESS NOTE    Charles Gray  JME:[id number] DOB: December 10, 1955 DOA: 03/04/2022 PCP: Enid Skeens., MD  66 /M with history of HFpEF (EF 60-65%), PAF on Eliquis, history of PE/DVT, COPD, cirrhosis, HTN, HLD, iron deficiency anemia, GAD, OSA nonadherent to CPAP who presented to the ED for evaluation of dyspnea and lower extremity edema x 10days, 15lb weight gain In ED, labs BNP of 60, creat 0.9, CXR w/ emphysema -He is followed by advanced heart failure team and para medicine  Subjective: Breathing a little better, has anxiety  Assessment and Plan:  Acute on chronic diastolic CHF 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  Also has a component of chronic venous insufficiency - Last echo w/  EF 60-65% 07/09/2021. -Diuresing on IV Lasix, he is 5 L negative, continue IV Lasix, aldactone, toprol -Repeat echo with preserved EF, RV not well-visualized -Reportedly intolerant to Jardiance(GI symptoms) -Continue UNNA boots, ultrasound without ascites -Strict I/O's, Daily weights -increase activity, BMP in a.m.  Paroxysmal atrial fibrillation (HCC) Sinus rhythm on admission. -Continue Toprol-XL and Eliquis  History of DVT/PE -Continue Eliquis  COPD (chronic obstructive pulmonary disease) (HCC) -Continue as needed albuterol  Obesity OSA (obstructive sleep apnea) -Reportedly intolerant to CPAP  Cirrhosis of liver without ascites (HCC) -Diuretics as above -Continue  lactulose  Anemia of chronic disease Iron deficiency anemia -Iron stores are low, given IV Feraheme 11/24  Mixed hyperlipidemia Continue pravastatin.  Anxiety -Continue sertraline  Chronic Suboxone use -Resumed  OSA -Reportedly cannot tolerate CPAP, counseled, counseled again   DVT prophylaxis: apixaban Code Status: FUll code Family Communication: None present Disposition Plan: Home likely 3 to 4 days  Consultants: CHF  team   Procedures:   Antimicrobials:    Objective: Vitals:   03/06/22 2014 03/06/22 2125 03/07/22 0515 03/07/22 0732  BP: 127/65 (!) 148/80 (!) 104/50 112/63  Pulse: 63 87  76  Resp:   18 18  Temp: 98.1 F (36.7 C)  97.6 F (36.4 C) 97.6 F (36.4 C)  TempSrc: Oral  Oral Oral  SpO2: 93%  100% 96%  Weight:   (!) 177.5 kg     Intake/Output Summary (Last 24 hours) at 03/07/2022 1043 Last data filed at 03/07/2022 0846 Gross per 24 hour  Intake 1084 ml  Output 2985 ml  Net -1901 ml   Filed Weights   03/05/22 0432 03/06/22 0532 03/07/22 0515  Weight: (!) 179.2 kg (!) 177.9 kg (!) 177.5 kg    Examination:  General exam: Obese male sitting up in bed, AAOx3, no distress HEENT: Neck obese unable to assess JVD CVS: S1-S2, regular rhythm Lungs: Clear bilaterally Abdomen: Soft, nontender, bowel sounds present Extremities: 2+ edema, Unna boots on Psychiatry:  Mood & affect appropriate.     Data Reviewed:   CBC: Recent Labs  Lab 03/04/22 1516 03/05/22 0023  WBC 6.5 6.9  NEUTROABS 5.4  --   HGB 9.4* 9.4*  HCT 33.4* 32.5*  MCV 74.6* 72.9*  PLT 137* 229*   Basic Metabolic Panel: Recent Labs  Lab 03/04/22 1516 03/05/22 0023 03/06/22 1002 03/06/22 1004 03/07/22 0037  NA 138 137  --  138 137  K 3.9 3.3*  --  3.1* 4.1  CL 97* 97*  --  100 99  CO2 26 28  --  27 27  GLUCOSE 136* 137*  --  122* 121*  BUN 13 12  --  9 11  CREATININE 0.96 1.09  --  0.89 1.01  CALCIUM 9.2 8.9  --  8.8* 8.8*  MG  --  2.1 2.1  --  2.2   GFR: Estimated Creatinine Clearance: 133.7 mL/min (by C-G formula based on SCr of 1.01 mg/dL). Liver Function Tests: Recent Labs  Lab 03/04/22 1516 03/05/22 0023 03/06/22 1004  AST 50* 52* 61*  ALT 19 21 27   ALKPHOS 88 89 86  BILITOT 1.1 1.2 1.2  PROT 7.4 7.6 8.1  ALBUMIN 3.3* 3.4* 3.5   No results for input(s): "LIPASE", "AMYLASE" in the last 168 hours. No results for input(s): "AMMONIA" in the last 168 hours. Coagulation Profile: No  results for input(s): "INR", "PROTIME" in the last 168 hours. Cardiac Enzymes: No results for input(s): "CKTOTAL", "CKMB", "CKMBINDEX", "TROPONINI" in the last 168 hours. BNP (last 3 results) No results for input(s): "PROBNP" in the last 8760 hours. HbA1C: No results for input(s): "HGBA1C" in the last 72 hours. CBG: No results for input(s): "GLUCAP" in the last 168 hours. Lipid Profile: No results for input(s): "CHOL", "HDL", "LDLCALC", "TRIG", "CHOLHDL", "LDLDIRECT" in the last 72 hours. Thyroid Function Tests: No results for input(s): "TSH", "T4TOTAL", "FREET4", "T3FREE", "THYROIDAB" in the last 72 hours. Anemia Panel: Recent Labs    03/06/22 0025  VITAMINB12 431  FOLATE 30.6  FERRITIN 20*  TIBC 613*  IRON 37*  RETICCTPCT 1.5   Urine analysis:    Component Value Date/Time   COLORURINE YELLOW 03/04/2022 2000   APPEARANCEUR CLEAR 03/04/2022 2000   LABSPEC 1.010 03/04/2022 2000   PHURINE 8.0 03/04/2022 2000   GLUCOSEU NEGATIVE 03/04/2022 2000   HGBUR NEGATIVE 03/04/2022 2000   BILIRUBINUR NEGATIVE 03/04/2022 2000   KETONESUR NEGATIVE 03/04/2022 2000   PROTEINUR NEGATIVE 03/04/2022 2000   NITRITE NEGATIVE 03/04/2022 2000   LEUKOCYTESUR NEGATIVE 03/04/2022 2000   Sepsis Labs: @LABRCNTIP (procalcitonin:4,lacticidven:4)  ) Recent Results (from the past 240 hour(s))  Resp Panel by RT-PCR (Flu A&B, Covid) Anterior Nasal Swab     Status: None   Collection Time: 03/04/22  3:33 PM   Specimen: Anterior Nasal Swab  Result Value Ref Range Status   SARS Coronavirus 2 by RT PCR NEGATIVE NEGATIVE Final    Comment: (NOTE) SARS-CoV-2 target nucleic acids are NOT DETECTED.  The SARS-CoV-2 RNA is generally detectable in upper respiratory specimens during the acute phase of infection. The lowest concentration of SARS-CoV-2 viral copies this assay can detect is 138 copies/mL. A negative result does not preclude SARS-Cov-2 infection and should not be used as the sole basis for  treatment or other patient management decisions. A negative result may occur with  improper specimen collection/handling, submission of specimen other than nasopharyngeal swab, presence of viral mutation(s) within the areas targeted by this assay, and inadequate number of viral copies(<138 copies/mL). A negative result must be combined with clinical observations, patient history, and epidemiological information. The expected result is Negative.  Fact Sheet for Patients:  EntrepreneurPulse.com.au  Fact Sheet for Healthcare Providers:  IncredibleEmployment.be  This test is no t yet approved or cleared by the [place] FDA and  has been authorized for detection and/or diagnosis of SARS-CoV-2 by FDA under an Emergency Use Authorization (EUA). This EUA will remain  in effect (meaning this test can be used) for the duration of the COVID-19 declaration under Section 564(b)(1) of the Act, 21 U.S.C.section 360bbb-3(b)(1), unless the authorization is terminated  or revoked sooner.       Influenza A by PCR NEGATIVE NEGATIVE Final   Influenza B by PCR NEGATIVE NEGATIVE Final  Comment: (NOTE) The Xpert Xpress SARS-CoV-2/FLU/RSV plus assay is intended as an aid in the diagnosis of influenza from Nasopharyngeal swab specimens and should not be used as a sole basis for treatment. Nasal washings and aspirates are unacceptable for Xpert Xpress SARS-CoV-2/FLU/RSV testing.  Fact Sheet for Patients: EntrepreneurPulse.com.au  Fact Sheet for Healthcare Providers: IncredibleEmployment.be  This test is not yet approved or cleared by the [place] FDA and has been authorized for detection and/or diagnosis of SARS-CoV-2 by FDA under an Emergency Use Authorization (EUA). This EUA will remain in effect (meaning this test can be used) for the duration of the COVID-19 declaration under Section 564(b)(1) of the Act, 21  U.S.C. section 360bbb-3(b)(1), unless the authorization is terminated or revoked.  Performed at [facility], [place] [address] [place], [place] [address]      Radiology Studies: ECHOCARDIOGRAM COMPLETE  Result Date: 03/06/2022    ECHOCARDIOGRAM REPORT   Patient Name:   TOAN MORT Date of Exam: 03/06/2022 Medical Rec #:  [id number]       Height:       82.0 in Accession #:    [id number]      Weight:       392.2 lb Date of Birth:  Aug 20, 1955       BSA:          3.117 m Patient Age:    75 years        BP:           136/77 mmHg Patient Gender: M               HR:           72 bpm. Exam Location:  Inpatient Procedure: 2D Echo, Cardiac Doppler, Color Doppler and Intracardiac            Opacification Agent Indications:    CHF-Acute Diastolic J09.32  History:        Patient has prior history of Echocardiogram examinations, most                 recent 07/09/2021. CHF, Signs/Symptoms:Dyspnea, Chest Pain and                 Edema; Risk Factors:Sleep Apnea, Dyslipidemia, Hypertension and                 Non-Smoker.  Sonographer:    Greer Pickerel Referring Phys: [id number] VISHAL R PATEL  Sonographer Comments: Technically difficult study due to poor echo windows, suboptimal parasternal window, suboptimal apical window, suboptimal subcostal window and patient is obese. Image acquisition challenging due to respiratory motion. IMPRESSIONS  1. Left ventricular ejection fraction, by estimation, is 55 to 60%. The left ventricle has normal function. The left ventricle has no regional wall motion abnormalities. The left ventricular internal cavity size was not well visualized. Left ventricular  diastolic parameters are indeterminate.  2. Right ventricular systolic function was not well visualized. The right ventricular size is not well visualized. There is normal pulmonary artery systolic pressure.  3. The mitral valve was not well visualized. No evidence of mitral valve regurgitation.  4. The aortic valve was not well  visualized. Aortic valve regurgitation is not visualized. No aortic stenosis is present.  5. Aortic dilatation noted. There is mild dilatation of the aortic root, measuring 40 mm.  6. Technically difficult study. FINDINGS  Left Ventricle: Left ventricular ejection fraction, by estimation, is 55 to 60%. The left ventricle has normal function. The left ventricle has no regional  wall motion abnormalities. Definity contrast agent was given IV to delineate the left ventricular  endocardial borders. The left ventricular internal cavity size was not well visualized. Suboptimal image quality limits for assessment of left ventricular hypertrophy. Left ventricular diastolic parameters are indeterminate. Right Ventricle: The right ventricular size is not well visualized. Right vetricular wall thickness was not well visualized. Right ventricular systolic function was not well visualized. There is normal pulmonary artery systolic pressure. The tricuspid regurgitant velocity is 1.22 m/s, and with an assumed right atrial pressure of 8 mmHg, the estimated right ventricular systolic pressure is 46.5 mmHg. Left Atrium: Left atrial size was not well visualized. Right Atrium: Right atrial size was not well visualized. Pericardium: There is no evidence of pericardial effusion. Mitral Valve: The mitral valve was not well visualized. No evidence of mitral valve regurgitation. Tricuspid Valve: The tricuspid valve is not well visualized. Tricuspid valve regurgitation is not demonstrated. Aortic Valve: The aortic valve was not well visualized. Aortic valve regurgitation is not visualized. No aortic stenosis is present. Pulmonic Valve: The pulmonic valve was not well visualized. Pulmonic valve regurgitation is not visualized. Aorta: Aortic dilatation noted. There is mild dilatation of the aortic root, measuring 40 mm. IAS/Shunts: The interatrial septum was not well visualized.  LEFT VENTRICLE PLAX 2D LVOT diam:     2.30 cm      Diastology LV  SV:         67           LV e' medial:    14.00 cm/s LV SV Index:   21           LV E/e' medial:  6.7 LVOT Area:     4.15 cm     LV e' lateral:   12.50 cm/s                             LV E/e' lateral: 7.5  LV Volumes (MOD) LV vol d, MOD A2C: 161.0 ml LV vol d, MOD A4C: 193.0 ml LV vol s, MOD A2C: 114.0 ml LV vol s, MOD A4C: 83.6 ml LV SV MOD A2C:     47.0 ml LV SV MOD A4C:     193.0 ml LV SV MOD BP:      81.1 ml RIGHT VENTRICLE RV S prime:     14.50 cm/s LEFT ATRIUM             Index LA Vol (A2C):   71.6 ml 22.97 ml/m LA Vol (A4C):   79.9 ml 25.63 ml/m LA Biplane Vol: 84.7 ml 27.17 ml/m  AORTIC VALVE LVOT Vmax:   101.00 cm/s LVOT Vmean:  64.000 cm/s LVOT VTI:    0.161 m  AORTA Ao Root diam: 4.00 cm MITRAL VALVE               TRICUSPID VALVE MV Area (PHT): 3.30 cm    TR Peak grad:   6.0 mmHg MV Decel Time: 230 msec    TR Vmax:        122.00 cm/s MV E velocity: 94.30 cm/s MV A velocity: 80.60 cm/s  SHUNTS MV E/A ratio:  1.17        Systemic VTI:  0.16 m                            Systemic Diam: 2.30 cm Rudean Haskell MD Electronically signed by Rudean Haskell MD  Signature Date/Time: 03/06/2022/2:17:46 PM    Final    [place] ASCITES (ABDOMEN LIMITED)  Result Date: 03/06/2022 CLINICAL DATA:  Ascites EXAM: LIMITED ABDOMEN ULTRASOUND FOR ASCITES TECHNIQUE: Limited ultrasound survey for ascites was performed in all four abdominal quadrants. COMPARISON:  None Available. FINDINGS: No evidence of ascites. IMPRESSION: No evidence of ascites. Electronically Signed   By: Maurine Simmering M.D.   On: 03/06/2022 10:53     Scheduled Meds:  apixaban  5 mg Oral BID   buprenorphine-naloxone  1 tablet Sublingual Daily   gabapentin  200 mg Oral BID   lactulose  33.33 g Oral BID   metoprolol succinate  25 mg Oral QHS   pravastatin  20 mg Oral QHS   sertraline  50 mg Oral Daily   sodium chloride flush  3 mL Intravenous Q12H   spironolactone  25 mg Oral Daily   tamsulosin  0.4 mg Oral Daily   Continuous  Infusions:  furosemide Stopped (03/07/22 0622)     LOS: 3 days    Time spent: 39mn  PDomenic Polite MD [facility]   03/07/2022, 10:43 AM

## 2022-03-07 NOTE — Progress Notes (Signed)
Refused cpap.

## 2022-03-07 NOTE — Progress Notes (Signed)
Advanced Heart Failure Rounding Note  PCP-Cardiologist: Loralie Champagne, MD   Subjective:    Net -5.6 L.  Patient complaining of right great toe pain.  Continues to complain of shortness of breath and nausea.  Objective:   Weight Range: (!) 177.5 kg Body mass index is 40.92 kg/m.   Vital Signs:   Temp:  [97.6 F (36.4 C)-98.1 F (36.7 C)] 97.6 F (36.4 C) (11/25 0732) Pulse Rate:  [63-87] 76 (11/25 0732) Resp:  [18-20] 18 (11/25 0732) BP: (104-148)/(50-80) 112/63 (11/25 0732) SpO2:  [93 %-100 %] 96 % (11/25 0732) Weight:  [177.5 kg] 177.5 kg (11/25 0515) Last BM Date : 03/05/22  Weight change: Filed Weights   03/05/22 0432 03/06/22 0532 03/07/22 0515  Weight: (!) 179.2 kg (!) 177.9 kg (!) 177.5 kg    Intake/Output:   Intake/Output Summary (Last 24 hours) at 03/07/2022 1211 Last data filed at 03/07/2022 0846 Gross per 24 hour  Intake 1084 ml  Output 2985 ml  Net -1901 ml       Physical Exam    GEN: Well nourished, well developed, in no acute distress  HEENT: normal  Neck: JVD difficult due to body habitus, carotid bruits, or masses Cardiac: RRR; no murmurs, rubs, or gallops, 3+ edema  Respiratory: Diminished GI: soft, nontender, nondistended, + BS MS: no deformity or atrophy  Skin: warm and dry Neuro:  Strength and sensation are intact Psych: euthymic mood, full affect   Telemetry   Sinus rhythm-personally reviewed  Labs    CBC Recent Labs    03/04/22 1516 03/05/22 0023  WBC 6.5 6.9  NEUTROABS 5.4  --   HGB 9.4* 9.4*  HCT 33.4* 32.5*  MCV 74.6* 72.9*  PLT 137* 145*    Basic Metabolic Panel Recent Labs    03/06/22 1002 03/06/22 1004 03/07/22 0037  NA  --  138 137  K  --  3.1* 4.1  CL  --  100 99  CO2  --  27 27  GLUCOSE  --  122* 121*  BUN  --  9 11  CREATININE  --  0.89 1.01  CALCIUM  --  8.8* 8.8*  MG 2.1  --  2.2    Liver Function Tests Recent Labs    03/05/22 0023 03/06/22 1004  AST 52* 61*  ALT 21 27  ALKPHOS  89 86  BILITOT 1.2 1.2  PROT 7.6 8.1  ALBUMIN 3.4* 3.5    No results for input(s): "LIPASE", "AMYLASE" in the last 72 hours. Cardiac Enzymes No results for input(s): "CKTOTAL", "CKMB", "CKMBINDEX", "TROPONINI" in the last 72 hours.  BNP: BNP (last 3 results) Recent Labs    11/21/21 1025 12/10/21 1414 03/04/22 1516  BNP 24.3 35.9 60.9     ProBNP (last 3 results) No results for input(s): "PROBNP" in the last 8760 hours.   D-Dimer No results for input(s): "DDIMER" in the last 72 hours. Hemoglobin A1C No results for input(s): "HGBA1C" in the last 72 hours. Fasting Lipid Panel No results for input(s): "CHOL", "HDL", "LDLCALC", "TRIG", "CHOLHDL", "LDLDIRECT" in the last 72 hours. Thyroid Function Tests No results for input(s): "TSH", "T4TOTAL", "T3FREE", "THYROIDAB" in the last 72 hours.  Invalid input(s): "FREET3"  Other results:   Imaging    ECHOCARDIOGRAM COMPLETE  Result Date: 03/06/2022    ECHOCARDIOGRAM REPORT   Patient Name:   Charles Gray Date of Exam: 03/06/2022 Medical Rec #:  [id number]       Height:  82.0 in Accession #:    [id number]      Weight:       392.2 lb Date of Birth:  [birth year]       BSA:          3.117 m Patient Age:    66 years        BP:           136/77 mmHg Patient Gender: M               HR:           72 bpm. Exam Location:  Inpatient Procedure: 2D Echo, Cardiac Doppler, Color Doppler and Intracardiac            Opacification Agent Indications:    CHF-Acute Diastolic U93.23  History:        Patient has prior history of Echocardiogram examinations, most                 recent 07/09/2021. CHF, Signs/Symptoms:Dyspnea, Chest Pain and                 Edema; Risk Factors:Sleep Apnea, Dyslipidemia, Hypertension and                 Non-Smoker.  Sonographer:    Greer Pickerel Referring Phys: [id number] VISHAL R PATEL  Sonographer Comments: Technically difficult study due to poor echo windows, suboptimal parasternal window, suboptimal apical window,  suboptimal subcostal window and patient is obese. Image acquisition challenging due to respiratory motion. IMPRESSIONS  1. Left ventricular ejection fraction, by estimation, is 55 to 60%. The left ventricle has normal function. The left ventricle has no regional wall motion abnormalities. The left ventricular internal cavity size was not well visualized. Left ventricular  diastolic parameters are indeterminate.  2. Right ventricular systolic function was not well visualized. The right ventricular size is not well visualized. There is normal pulmonary artery systolic pressure.  3. The mitral valve was not well visualized. No evidence of mitral valve regurgitation.  4. The aortic valve was not well visualized. Aortic valve regurgitation is not visualized. No aortic stenosis is present.  5. Aortic dilatation noted. There is mild dilatation of the aortic root, measuring 40 mm.  6. Technically difficult study. FINDINGS  Left Ventricle: Left ventricular ejection fraction, by estimation, is 55 to 60%. The left ventricle has normal function. The left ventricle has no regional wall motion abnormalities. Definity contrast agent was given IV to delineate the left ventricular  endocardial borders. The left ventricular internal cavity size was not well visualized. Suboptimal image quality limits for assessment of left ventricular hypertrophy. Left ventricular diastolic parameters are indeterminate. Right Ventricle: The right ventricular size is not well visualized. Right vetricular wall thickness was not well visualized. Right ventricular systolic function was not well visualized. There is normal pulmonary artery systolic pressure. The tricuspid regurgitant velocity is 1.22 m/s, and with an assumed right atrial pressure of 8 mmHg, the estimated right ventricular systolic pressure is 25.4 mmHg. Left Atrium: Left atrial size was not well visualized. Right Atrium: Right atrial size was not well visualized. Pericardium: There is no  evidence of pericardial effusion. Mitral Valve: The mitral valve was not well visualized. No evidence of mitral valve regurgitation. Tricuspid Valve: The tricuspid valve is not well visualized. Tricuspid valve regurgitation is not demonstrated. Aortic Valve: The aortic valve was not well visualized. Aortic valve regurgitation is not visualized. No aortic stenosis is present. Pulmonic Valve: The pulmonic valve was not well visualized.  Pulmonic valve regurgitation is not visualized. Aorta: Aortic dilatation noted. There is mild dilatation of the aortic root, measuring 40 mm. IAS/Shunts: The interatrial septum was not well visualized.  LEFT VENTRICLE PLAX 2D LVOT diam:     2.30 cm      Diastology LV SV:         67           LV e' medial:    14.00 cm/s LV SV Index:   21           LV E/e' medial:  6.7 LVOT Area:     4.15 cm     LV e' lateral:   12.50 cm/s                             LV E/e' lateral: 7.5  LV Volumes (MOD) LV vol d, MOD A2C: 161.0 ml LV vol d, MOD A4C: 193.0 ml LV vol s, MOD A2C: 114.0 ml LV vol s, MOD A4C: 83.6 ml LV SV MOD A2C:     47.0 ml LV SV MOD A4C:     193.0 ml LV SV MOD BP:      81.1 ml RIGHT VENTRICLE RV S prime:     14.50 cm/s LEFT ATRIUM             Index LA Vol (A2C):   71.6 ml 22.97 ml/m LA Vol (A4C):   79.9 ml 25.63 ml/m LA Biplane Vol: 84.7 ml 27.17 ml/m  AORTIC VALVE LVOT Vmax:   101.00 cm/s LVOT Vmean:  64.000 cm/s LVOT VTI:    0.161 m  AORTA Ao Root diam: 4.00 cm MITRAL VALVE               TRICUSPID VALVE MV Area (PHT): 3.30 cm    TR Peak grad:   6.0 mmHg MV Decel Time: 230 msec    TR Vmax:        122.00 cm/s MV E velocity: 94.30 cm/s MV A velocity: 80.60 cm/s  SHUNTS MV E/A ratio:  1.17        Systemic VTI:  0.16 m                            Systemic Diam: 2.30 cm Rudean Haskell MD Electronically signed by Rudean Haskell MD Signature Date/Time: 03/06/2022/2:17:46 PM    Final      Medications:     Scheduled Medications:  apixaban  5 mg Oral BID    buprenorphine-naloxone  1 tablet Sublingual Daily   gabapentin  200 mg Oral BID   lactulose  33.33 g Oral BID   metoprolol succinate  25 mg Oral QHS   pravastatin  20 mg Oral QHS   sertraline  50 mg Oral Daily   sodium chloride flush  3 mL Intravenous Q12H   spironolactone  25 mg Oral Daily   tamsulosin  0.4 mg Oral Daily    Infusions:  furosemide Stopped (03/07/22 0622)    PRN Medications: acetaminophen **OR** acetaminophen, albuterol, ondansetron **OR** ondansetron (ZOFRAN) IV, senna-docusate, zolpidem   Assessment/Plan   1.  Acute on chronic heart failure with preserved ejection fraction: Weight significantly elevated.  He has lower extremity edema.  Last ejection fraction was normal.  Continue current medications.  Net -4.6 L.  Rocklyn Mayberry need continued diuresis.  2.  Paroxysmal atrial fibrillation: In sinus rhythm.  Continue Toprol-XL and Eliquis.  3.  COPD: Plan per primary team  4.  Obstructive sleep apnea: CPAP compliance encouraged  5.  Hypertension: Currently well controlled  6.  Toe pain: Patient does not have a diagnosis of gout.  Despite that, does have the appearance of gouty arthritis.  Nickey Kloepfer start colchicine.      SDOH: -Has paramedicine in the community. Reports adherence with meds. However, does have history of missing doses of potassium and metolazone.  Length of Stay: 3  Emanuel Campos Meredith Leeds, MD  03/07/2022, 12:11 PM  Advanced Heart Failure Team Pager [phone] (M-F; 7a - 5p)  Please contact [facility] for night-coverage after hours (5p -7a ) and weekends on [email or web address]

## 2022-03-08 ENCOUNTER — Other Ambulatory Visit: Payer: Self-pay

## 2022-03-08 ENCOUNTER — Encounter (HOSPITAL_COMMUNITY): Payer: Self-pay | Admitting: Internal Medicine

## 2022-03-08 DIAGNOSIS — I5033 Acute on chronic diastolic (congestive) heart failure: Secondary | ICD-10-CM | POA: Diagnosis not present

## 2022-03-08 LAB — BASIC METABOLIC PANEL
Anion gap: 10 (ref 5–15)
BUN: 8 mg/dL (ref 8–23)
CO2: 28 mmol/L (ref 22–32)
Calcium: 8.9 mg/dL (ref 8.9–10.3)
Chloride: 99 mmol/L (ref 98–111)
Creatinine, Ser: 0.94 mg/dL (ref 0.61–1.24)
GFR, Estimated: >60 mL/min (ref >60)
Glucose, Bld: 108 mg/dL — ABNORMAL HIGH (ref 70–99)
Potassium: 3 mmol/L — ABNORMAL LOW (ref 3.5–5.1)
Sodium: 137 mmol/L (ref 135–145)

## 2022-03-08 MED ORDER — SODIUM CHLORIDE 0.9 % IV SOLN: INTRAVENOUS | Status: DC | PRN

## 2022-03-08 MED ORDER — POTASSIUM CHLORIDE CRYS ER 20 MEQ PO TBCR
40.0000 meq | EXTENDED_RELEASE_TABLET | Freq: Two times a day (BID) | ORAL | Status: AC
  Administered 2022-03-08 (×2): 40 meq via ORAL
  Filled 2022-03-08 (×2): qty 2

## 2022-03-08 NOTE — Progress Notes (Signed)
Refuses cpap

## 2022-03-08 NOTE — Progress Notes (Addendum)
PROGRESS NOTE    Charles Gray  FKC:[id number] DOB: 1955/11/26 DOA: 03/04/2022 PCP: Enid Skeens., MD  66 /M with history of HFpEF (EF 60-65%), PAF on Eliquis, history of PE/DVT, COPD, cirrhosis, HTN, HLD, iron deficiency anemia, GAD, OSA nonadherent to CPAP who presented to the ED for evaluation of dyspnea and lower extremity edema x 10days, 15lb weight gain In ED, labs BNP of 60, creat 0.9, CXR w/ emphysema -He is followed by advanced heart failure team and para medicine -Improving slowly on diuretics  Subjective: Feels better overall, weight going down, now off oxygen  Assessment and Plan:  Acute on chronic diastolic CHF 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  Also has a component of chronic venous insufficiency - Last echo w/  EF 60-65% 07/09/2021. -Diuresing on IV Lasix, he is 8 L negative, continue IV Lasix, aldactone, toprol, weight going down -Repeat echo with preserved EF, RV not well-visualized -Reportedly intolerant to Jardiance(GI symptoms) -Continue UNNA boots, ultrasound without ascites -Strict I/O's, Daily weights -increase activity, BMP in a.m.  Paroxysmal atrial fibrillation (HCC) Sinus rhythm on admission. -Continue Toprol-XL and Eliquis  History of DVT/PE -Continue Eliquis  COPD (chronic obstructive pulmonary disease) (HCC) -Continue as needed albuterol  Obesity OSA (obstructive sleep apnea) -Reportedly intolerant to CPAP  Cirrhosis of liver without ascites (HCC) -Diuretics as above -Continue  lactulose, Aldactone  Anemia of chronic disease Iron deficiency anemia -Iron stores are low, given IV Feraheme 11/24  Mixed hyperlipidemia Continue pravastatin.  Anxiety -Continue sertraline  Chronic Suboxone use -Resumed  OSA -Reportedly cannot tolerate CPAP, counseled, counseled again  Possible gout -Started on colchicine yesterday, improving  DVT prophylaxis:  apixaban Code Status: FUll code Family Communication: None present Disposition Plan: Home likely 3 to 4 days  Consultants: CHF team   Procedures:   Antimicrobials:    Objective: Vitals:   03/07/22 1957 03/08/22 0124 03/08/22 0425 03/08/22 0837  BP: (!) 146/75  108/64 123/66  Pulse: 62  62 71  Resp: 18  18 20   Temp: 97.9 F (36.6 C)  97.8 F (36.6 C) 97.6 F (36.4 C)  TempSrc: Oral   Oral  SpO2: 100%  98% 97%  Weight:  (!) 176.3 kg      Intake/Output Summary (Last 24 hours) at 03/08/2022 0956 Last data filed at 03/08/2022 7494 Gross per 24 hour  Intake 547.43 ml  Output 4000 ml  Net -3452.57 ml   Filed Weights   03/06/22 0532 03/07/22 0515 03/08/22 0124  Weight: (!) 177.9 kg (!) 177.5 kg (!) 176.3 kg    Examination:  General exam: Obese male sitting up in bed, AAOx3, no distress HEENT: Neck obese unable to assess JVD CVS: S1-S2, regular rhythm Lungs: Clear bilaterally Abdomen: Soft, obese, nontender, bowel sounds present Extremities: Chronic edema to knees, Unna boots on  Psychiatry:  Mood & affect appropriate.     Data Reviewed:   CBC: Recent Labs  Lab 03/04/22 1516 03/05/22 0023  WBC 6.5 6.9  NEUTROABS 5.4  --   HGB 9.4* 9.4*  HCT 33.4* 32.5*  MCV 74.6* 72.9*  PLT 137* 496*   Basic Metabolic Panel: Recent Labs  Lab 03/04/22 1516 03/05/22 0023 03/06/22 1002 03/06/22 1004 03/07/22 0037 03/08/22 0046  NA 138 137  --  138 137 137  K 3.9 3.3*  --  3.1* 4.1 3.0*  CL 97* 97*  --  100 99 99  CO2 26 28  --  27 27 28  GLUCOSE 136* 137*  --  122* 121* 108*  BUN 13 12  --  9 11 8   CREATININE 0.96 1.09  --  0.89 1.01 0.94  CALCIUM 9.2 8.9  --  8.8* 8.8* 8.9  MG  --  2.1 2.1  --  2.2  --    GFR: Estimated Creatinine Clearance: 143.1 mL/min (by C-G formula based on SCr of 0.94 mg/dL). Liver Function Tests: Recent Labs  Lab 03/04/22 1516 03/05/22 0023 03/06/22 1004  AST 50* 52* 61*  ALT 19 21 27   ALKPHOS 88 89 86  BILITOT 1.1 1.2 1.2   PROT 7.4 7.6 8.1  ALBUMIN 3.3* 3.4* 3.5   No results for input(s): "LIPASE", "AMYLASE" in the last 168 hours. No results for input(s): "AMMONIA" in the last 168 hours. Coagulation Profile: No results for input(s): "INR", "PROTIME" in the last 168 hours. Cardiac Enzymes: No results for input(s): "CKTOTAL", "CKMB", "CKMBINDEX", "TROPONINI" in the last 168 hours. BNP (last 3 results) No results for input(s): "PROBNP" in the last 8760 hours. HbA1C: No results for input(s): "HGBA1C" in the last 72 hours. CBG: No results for input(s): "GLUCAP" in the last 168 hours. Lipid Profile: No results for input(s): "CHOL", "HDL", "LDLCALC", "TRIG", "CHOLHDL", "LDLDIRECT" in the last 72 hours. Thyroid Function Tests: No results for input(s): "TSH", "T4TOTAL", "FREET4", "T3FREE", "THYROIDAB" in the last 72 hours. Anemia Panel: Recent Labs    03/06/22 0025  VITAMINB12 431  FOLATE 30.6  FERRITIN 20*  TIBC 613*  IRON 37*  RETICCTPCT 1.5   Urine analysis:    Component Value Date/Time   COLORURINE YELLOW 03/04/2022 2000   APPEARANCEUR CLEAR 03/04/2022 2000   LABSPEC 1.010 03/04/2022 2000   PHURINE 8.0 03/04/2022 2000   GLUCOSEU NEGATIVE 03/04/2022 2000   HGBUR NEGATIVE 03/04/2022 2000   BILIRUBINUR NEGATIVE 03/04/2022 2000   KETONESUR NEGATIVE 03/04/2022 2000   PROTEINUR NEGATIVE 03/04/2022 2000   NITRITE NEGATIVE 03/04/2022 2000   LEUKOCYTESUR NEGATIVE 03/04/2022 2000   Sepsis Labs: @LABRCNTIP (procalcitonin:4,lacticidven:4)  ) Recent Results (from the past 240 hour(s))  Resp Panel by RT-PCR (Flu A&B, Covid) Anterior Nasal Swab     Status: None   Collection Time: 03/04/22  3:33 PM   Specimen: Anterior Nasal Swab  Result Value Ref Range Status   SARS Coronavirus 2 by RT PCR NEGATIVE NEGATIVE Final    Comment: (NOTE) SARS-CoV-2 target nucleic acids are NOT DETECTED.  The SARS-CoV-2 RNA is generally detectable in upper respiratory specimens during the acute phase of infection. The  lowest concentration of SARS-CoV-2 viral copies this assay can detect is 138 copies/mL. A negative result does not preclude SARS-Cov-2 infection and should not be used as the sole basis for treatment or other patient management decisions. A negative result may occur with  improper specimen collection/handling, submission of specimen other than nasopharyngeal swab, presence of viral mutation(s) within the areas targeted by this assay, and inadequate number of viral copies(<138 copies/mL). A negative result must be combined with clinical observations, patient history, and epidemiological information. The expected result is Negative.  Fact Sheet for Patients:  EntrepreneurPulse.com.au  Fact Sheet for Healthcare Providers:  IncredibleEmployment.be  This test is no t yet approved or cleared by the [place] FDA and  has been authorized for detection and/or diagnosis of SARS-CoV-2 by FDA under an Emergency Use Authorization (EUA). This EUA will remain  in effect (meaning this test can be used) for the duration of the COVID-19 declaration under Section 564(b)(1) of the Act, 21 U.S.C.section 360bbb-3(b)(1),  unless the authorization is terminated  or revoked sooner.       Influenza A by PCR NEGATIVE NEGATIVE Final   Influenza B by PCR NEGATIVE NEGATIVE Final    Comment: (NOTE) The Xpert Xpress SARS-CoV-2/FLU/RSV plus assay is intended as an aid in the diagnosis of influenza from Nasopharyngeal swab specimens and should not be used as a sole basis for treatment. Nasal washings and aspirates are unacceptable for Xpert Xpress SARS-CoV-2/FLU/RSV testing.  Fact Sheet for Patients: EntrepreneurPulse.com.au  Fact Sheet for Healthcare Providers: IncredibleEmployment.be  This test is not yet approved or cleared by the [place] FDA and has been authorized for detection and/or diagnosis of SARS-CoV-2 by FDA under  an Emergency Use Authorization (EUA). This EUA will remain in effect (meaning this test can be used) for the duration of the COVID-19 declaration under Section 564(b)(1) of the Act, 21 U.S.C. section 360bbb-3(b)(1), unless the authorization is terminated or revoked.  Performed at [facility], [place] [address], [place], [place] [address]      Radiology Studies: ECHOCARDIOGRAM COMPLETE  Result Date: 03/06/2022    ECHOCARDIOGRAM REPORT   Patient Name:   Charles Gray Date of Exam: 03/06/2022 Medical Rec #:  [id number]       Height:       82.0 in Accession #:    [id number]      Weight:       392.2 lb Date of Birth:  18-Mar-1956       BSA:          3.117 m Patient Age:    60 years        BP:           136/77 mmHg Patient Gender: M               HR:           72 bpm. Exam Location:  Inpatient Procedure: 2D Echo, Cardiac Doppler, Color Doppler and Intracardiac            Opacification Agent Indications:    CHF-Acute Diastolic C78.93  History:        Patient has prior history of Echocardiogram examinations, most                 recent 07/09/2021. CHF, Signs/Symptoms:Dyspnea, Chest Pain and                 Edema; Risk Factors:Sleep Apnea, Dyslipidemia, Hypertension and                 Non-Smoker.  Sonographer:    Greer Pickerel Referring Phys: [id number] VISHAL R PATEL  Sonographer Comments: Technically difficult study due to poor echo windows, suboptimal parasternal window, suboptimal apical window, suboptimal subcostal window and patient is obese. Image acquisition challenging due to respiratory motion. IMPRESSIONS  1. Left ventricular ejection fraction, by estimation, is 55 to 60%. The left ventricle has normal function. The left ventricle has no regional wall motion abnormalities. The left ventricular internal cavity size was not well visualized. Left ventricular  diastolic parameters are indeterminate.  2. Right ventricular systolic function was not well visualized. The right ventricular size is not well  visualized. There is normal pulmonary artery systolic pressure.  3. The mitral valve was not well visualized. No evidence of mitral valve regurgitation.  4. The aortic valve was not well visualized. Aortic valve regurgitation is not visualized. No aortic stenosis is present.  5. Aortic dilatation noted. There is mild dilatation of the aortic root,  measuring 40 mm.  6. Technically difficult study. FINDINGS  Left Ventricle: Left ventricular ejection fraction, by estimation, is 55 to 60%. The left ventricle has normal function. The left ventricle has no regional wall motion abnormalities. Definity contrast agent was given IV to delineate the left ventricular  endocardial borders. The left ventricular internal cavity size was not well visualized. Suboptimal image quality limits for assessment of left ventricular hypertrophy. Left ventricular diastolic parameters are indeterminate. Right Ventricle: The right ventricular size is not well visualized. Right vetricular wall thickness was not well visualized. Right ventricular systolic function was not well visualized. There is normal pulmonary artery systolic pressure. The tricuspid regurgitant velocity is 1.22 m/s, and with an assumed right atrial pressure of 8 mmHg, the estimated right ventricular systolic pressure is 40.9 mmHg. Left Atrium: Left atrial size was not well visualized. Right Atrium: Right atrial size was not well visualized. Pericardium: There is no evidence of pericardial effusion. Mitral Valve: The mitral valve was not well visualized. No evidence of mitral valve regurgitation. Tricuspid Valve: The tricuspid valve is not well visualized. Tricuspid valve regurgitation is not demonstrated. Aortic Valve: The aortic valve was not well visualized. Aortic valve regurgitation is not visualized. No aortic stenosis is present. Pulmonic Valve: The pulmonic valve was not well visualized. Pulmonic valve regurgitation is not visualized. Aorta: Aortic dilatation noted.  There is mild dilatation of the aortic root, measuring 40 mm. IAS/Shunts: The interatrial septum was not well visualized.  LEFT VENTRICLE PLAX 2D LVOT diam:     2.30 cm      Diastology LV SV:         67           LV e' medial:    14.00 cm/s LV SV Index:   21           LV E/e' medial:  6.7 LVOT Area:     4.15 cm     LV e' lateral:   12.50 cm/s                             LV E/e' lateral: 7.5  LV Volumes (MOD) LV vol d, MOD A2C: 161.0 ml LV vol d, MOD A4C: 193.0 ml LV vol s, MOD A2C: 114.0 ml LV vol s, MOD A4C: 83.6 ml LV SV MOD A2C:     47.0 ml LV SV MOD A4C:     193.0 ml LV SV MOD BP:      81.1 ml RIGHT VENTRICLE RV S prime:     14.50 cm/s LEFT ATRIUM             Index LA Vol (A2C):   71.6 ml 22.97 ml/m LA Vol (A4C):   79.9 ml 25.63 ml/m LA Biplane Vol: 84.7 ml 27.17 ml/m  AORTIC VALVE LVOT Vmax:   101.00 cm/s LVOT Vmean:  64.000 cm/s LVOT VTI:    0.161 m  AORTA Ao Root diam: 4.00 cm MITRAL VALVE               TRICUSPID VALVE MV Area (PHT): 3.30 cm    TR Peak grad:   6.0 mmHg MV Decel Time: 230 msec    TR Vmax:        122.00 cm/s MV E velocity: 94.30 cm/s MV A velocity: 80.60 cm/s  SHUNTS MV E/A ratio:  1.17        Systemic VTI:  0.16 m  Systemic Diam: 2.30 cm Rudean Haskell MD Electronically signed by Rudean Haskell MD Signature Date/Time: 03/06/2022/2:17:46 PM    Final      Scheduled Meds:  apixaban  5 mg Oral BID   buprenorphine-naloxone  1 tablet Sublingual Daily   colchicine  0.6 mg Oral Daily   lactulose  33.33 g Oral BID   metoprolol succinate  25 mg Oral QHS   potassium chloride  40 mEq Oral BID   pravastatin  20 mg Oral QHS   sertraline  50 mg Oral Daily   sodium chloride flush  3 mL Intravenous Q12H   spironolactone  25 mg Oral Daily   tamsulosin  0.4 mg Oral Daily   Continuous Infusions:  sodium chloride Stopped (03/08/22 0650)   furosemide 62 mL/hr at 03/08/22 0653     LOS: 4 days    Time spent: 70mn  PDomenic Polite MD [facility]   03/08/2022, 9:56 AM

## 2022-03-08 NOTE — Plan of Care (Signed)
  Problem: Education: Goal: Ability to demonstrate management of disease process will improve Outcome: Progressing Goal: Ability to verbalize understanding of medication therapies will improve Outcome: Progressing   Problem: Activity: Goal: Capacity to carry out activities will improve Outcome: Progressing   

## 2022-03-08 NOTE — Progress Notes (Signed)
Patient ID: Charles Gray, male   DOB: [birth year], 66 y.o.   MRN: [id number]     Advanced Heart Failure Rounding Note  PCP-Cardiologist: Loralie Champagne, MD   Subjective:    Weight is coming down, has lost another 3 lbs.  Starting to feel better though toes still hurt.    Objective:   Weight Range: (!) 176.3 kg Body mass index is 40.64 kg/m.   Vital Signs:   Temp:  [97.7 F (36.5 C)-97.9 F (36.6 C)] 97.8 F (36.6 C) (11/26 0425) Pulse Rate:  [62-63] 62 (11/26 0425) Resp:  [18-20] 18 (11/26 0425) BP: (108-146)/(64-75) 108/64 (11/26 0425) SpO2:  [97 %-100 %] 98 % (11/26 0425) Weight:  [176.3 kg] 176.3 kg (11/26 0124) Last BM Date : 03/07/22  Weight change: Filed Weights   03/06/22 0532 03/07/22 0515 03/08/22 0124  Weight: (!) 177.9 kg (!) 177.5 kg (!) 176.3 kg    Intake/Output:   Intake/Output Summary (Last 24 hours) at 03/08/2022 0802 Last data filed at 03/08/2022 0653 Gross per 24 hour  Intake 1024.43 ml  Output 4000 ml  Net -2975.57 ml      Physical Exam    General: Obese HEENT: Normal Neck: Thick. JVP difficult. Carotids 2+ bilat; no bruits. No lymphadenopathy or thyromegaly appreciated. Cor: PMI nondisplaced. Regular rate & rhythm. No rubs, gallops or murmurs. Lungs: Clear Abdomen: Soft, nontender, nondistended. No hepatosplenomegaly. No bruits or masses. Good bowel sounds. Extremities: No cyanosis, clubbing, rash. Chronic edema to knees.  Neuro: Alert & orientedx3, cranial nerves grossly intact. moves all 4 extremities w/o difficulty. Affect pleasant   Telemetry   NSR 60s (personally reviewed)  Labs    CBC No results for input(s): "WBC", "NEUTROABS", "HGB", "HCT", "MCV", "PLT" in the last 72 hours. Basic Metabolic Panel Recent Labs    03/06/22 1002 03/06/22 1004 03/07/22 0037 03/08/22 0046  NA  --    < > 137 137  K  --    < > 4.1 3.0*  CL  --    < > 99 99  CO2  --    < > 27 28  GLUCOSE  --    < > 121* 108*  BUN  --    < > 11 8   CREATININE  --    < > 1.01 0.94  CALCIUM  --    < > 8.8* 8.9  MG 2.1  --  2.2  --    < > = values in this interval not displayed.   Liver Function Tests Recent Labs    03/06/22 1004  AST 61*  ALT 27  ALKPHOS 86  BILITOT 1.2  PROT 8.1  ALBUMIN 3.5   No results for input(s): "LIPASE", "AMYLASE" in the last 72 hours. Cardiac Enzymes No results for input(s): "CKTOTAL", "CKMB", "CKMBINDEX", "TROPONINI" in the last 72 hours.  BNP: BNP (last 3 results) Recent Labs    11/21/21 1025 12/10/21 1414 03/04/22 1516  BNP 24.3 35.9 60.9    ProBNP (last 3 results) No results for input(s): "PROBNP" in the last 8760 hours.   D-Dimer No results for input(s): "DDIMER" in the last 72 hours. Hemoglobin A1C No results for input(s): "HGBA1C" in the last 72 hours. Fasting Lipid Panel No results for input(s): "CHOL", "HDL", "LDLCALC", "TRIG", "CHOLHDL", "LDLDIRECT" in the last 72 hours. Thyroid Function Tests No results for input(s): "TSH", "T4TOTAL", "T3FREE", "THYROIDAB" in the last 72 hours.  Invalid input(s): "FREET3"  Other results:   Imaging    No results found.  Medications:     Scheduled Medications:  apixaban  5 mg Oral BID   buprenorphine-naloxone  1 tablet Sublingual Daily   colchicine  0.6 mg Oral Daily   lactulose  33.33 g Oral BID   metoprolol succinate  25 mg Oral QHS   potassium chloride  40 mEq Oral BID   pravastatin  20 mg Oral QHS   sertraline  50 mg Oral Daily   sodium chloride flush  3 mL Intravenous Q12H   spironolactone  25 mg Oral Daily   tamsulosin  0.4 mg Oral Daily    Infusions:  sodium chloride Stopped (03/08/22 0650)   furosemide 62 mL/hr at 03/08/22 0653    PRN Medications: sodium chloride, acetaminophen **OR** acetaminophen, albuterol, ondansetron **OR** ondansetron (ZOFRAN) IV, senna-docusate, zolpidem    Assessment/Plan   1. Acute on chronic diastolic CHF with suspected RV dysfunction: Echo 7/22 with EF 60-65%, mild LVH,  normal RV, unable to estimate PA systolic pressure. Multiple CHF admissions despite aggressive outpatient diuretic regimen and paramedicine.  RHC (7/22) with optimized filling pressures, PCWP 13, CO/CI 7.3/2.4. Received IV lasix 80 mg through paramedicine 11/12/20. RHC (8/22) with normal filling pressures; underwent placement of Cardiomems (dPAP goal 9 mmHg). Re-admitted w/ marked volume overload and slight progression of symptoms 3/23. Echo 3/23 EF 60-65% RV normal this admission.  Echo this admission with EF 55-60%, RV not well-visualized. Admitted with 20 lb weight gain despite no major change in his Cardiomems reading. He has diuresed well here, weight down 12 lbs. Creatinine stable at 0.96.  - Continue Lasix 120 mg IV bid at least 1 more day.  - Replace K.  - c/w unna boots, HH nurse to place tomorrow (needs on both legs).  - Continue spironolactone 25 daily, he is on eplerenone at home.  - Off Jardiance (GI upset). - Continue Toprol XL 25 mg daily.  2. H/o PE/DVT: Suspect due to sedentary lifestyle.  Occurred in 2/21.  V/Q scan in 6/21 did not show evidence for chronic PE.   - Continue Eliquis.  3. OSA: Cannot tolerate CPAP. 4. Anxiety/depression: This is a major issue.   - Continue sertraline 50 mg daily.  5. Toe pain: Treating for possible gout with prednisone.  6. Hypertension: Stable. Continue current meds. 7. Atrial fibrillation: Paroxysmal. In NSR today.  - Continue apixaban.  - Continue Toprol XL. 8. Cirrhosis: CT scan in past noted nodular contour of liver, compatible with underlying cirrhosis. In setting of known RV dysfunction and suspect cardiogenic. LFTs ok. EGD 12/22 showed portal hypertensive gastropathy, no varices.  This admission, abdominal US showed no ascites.  - On lactulose (also takes this for chronic constipation)  - Continue spironolactone.   Mobilize.   Length of Stay: 4  Loralie Champagne, MD  03/08/2022, 8:02 AM  Advanced Heart Failure Team Pager [phone]  (M-F; 7a - 5p)  Please contact [facility] for night-coverage after hours (5p -7a ) and weekends on [email or web address]

## 2022-03-09 DIAGNOSIS — I5033 Acute on chronic diastolic (congestive) heart failure: Secondary | ICD-10-CM | POA: Diagnosis not present

## 2022-03-09 LAB — BASIC METABOLIC PANEL
Anion gap: 13 (ref 5–15)
BUN: 11 mg/dL (ref 8–23)
CO2: 26 mmol/L (ref 22–32)
Calcium: 9 mg/dL (ref 8.9–10.3)
Chloride: 98 mmol/L (ref 98–111)
Creatinine, Ser: 0.85 mg/dL (ref 0.61–1.24)
GFR, Estimated: >60 mL/min (ref >60)
Glucose, Bld: 123 mg/dL — ABNORMAL HIGH (ref 70–99)
Potassium: 2.9 mmol/L — ABNORMAL LOW (ref 3.5–5.1)
Sodium: 137 mmol/L (ref 135–145)

## 2022-03-09 MED ORDER — PREDNISONE 20 MG PO TABS
40.0000 mg | ORAL_TABLET | Freq: Once | ORAL | Status: AC
  Administered 2022-03-09: 40 mg via ORAL
  Filled 2022-03-09: qty 2

## 2022-03-09 MED ORDER — POTASSIUM CHLORIDE CRYS ER 20 MEQ PO TBCR
60.0000 meq | EXTENDED_RELEASE_TABLET | ORAL | Status: AC
  Administered 2022-03-09 (×4): 60 meq via ORAL
  Filled 2022-03-09 (×4): qty 3

## 2022-03-09 MED ORDER — POTASSIUM CHLORIDE CRYS ER 20 MEQ PO TBCR: 60.0000 meq | EXTENDED_RELEASE_TABLET | Freq: Three times a day (TID) | ORAL | Status: DC

## 2022-03-09 MED ORDER — PREDNISONE 20 MG PO TABS: 40.0000 mg | ORAL_TABLET | Freq: Every day | ORAL | Status: DC

## 2022-03-09 MED ORDER — POTASSIUM CHLORIDE CRYS ER 20 MEQ PO TBCR: 40.0000 meq | EXTENDED_RELEASE_TABLET | Freq: Three times a day (TID) | ORAL | Status: DC

## 2022-03-09 NOTE — Plan of Care (Signed)
Pt walked in hallways x3 today. Pt had BM x1 this afternoon. Pt received one dose PO prednisone; per pt "I haven't felt well today, my face has felt hot and flushed, my heart felt fluttery at times." Broadus John MD notified.   Problem: Education: Goal: Ability to demonstrate management of disease process will improve Outcome: Progressing Goal: Ability to verbalize understanding of medication therapies will improve Outcome: Progressing   Problem: Activity: Goal: Capacity to carry out activities will improve Outcome: Progressing   Problem: Cardiac: Goal: Ability to achieve and maintain adequate cardiopulmonary perfusion will improve Outcome: Progressing   Problem: Education: Goal: Knowledge of General Education information will improve Description: Including pain rating scale, medication(s)/side effects and non-pharmacologic comfort measures Outcome: Progressing   Problem: Health Behavior/Discharge Planning: Goal: Ability to manage health-related needs will improve Outcome: Progressing   Problem: Clinical Measurements: Goal: Ability to maintain clinical measurements within normal limits will improve Outcome: Progressing Goal: Will remain free from infection Outcome: Progressing Goal: Diagnostic test results will improve Outcome: Progressing Goal: Respiratory complications will improve Outcome: Progressing Goal: Cardiovascular complication will be avoided Outcome: Progressing   Problem: Activity: Goal: Risk for activity intolerance will decrease Outcome: Progressing   Problem: Nutrition: Goal: Adequate nutrition will be maintained Outcome: Progressing   Problem: Coping: Goal: Level of anxiety will decrease Outcome: Progressing   Problem: Elimination: Goal: Will not experience complications related to bowel motility Outcome: Progressing Goal: Will not experience complications related to urinary retention Outcome: Progressing   Problem: Pain Managment: Goal: General  experience of comfort will improve Outcome: Progressing   Problem: Safety: Goal: Ability to remain free from injury will improve Outcome: Progressing   Problem: Skin Integrity: Goal: Risk for impaired skin integrity will decrease Outcome: Progressing

## 2022-03-09 NOTE — Progress Notes (Signed)
Orthopedic Tech Progress Note Patient Details:  Charles Gray 04-03-1956 [id number]  Ortho Devices Type of Ortho Device: Haematologist Ortho Device/Splint Location: BLE Ortho Device/Splint Interventions: Ordered, Application, Adjustment   Post Interventions Patient Tolerated: Well Instructions Provided: Adjustment of device, Care of device  Charles Gray 03/09/2022, 2:58 PM

## 2022-03-09 NOTE — Care Management Important Message (Signed)
Important Message  Patient Details  Name: [name] Scallon MRN: [id number] Date of Birth: 10-11-1955   Medicare Important Message Given:  Yes     Shelda Altes 03/09/2022, 9:46 AM

## 2022-03-09 NOTE — Progress Notes (Signed)
   03/09/22 1100  Mobility  Activity Ambulated with assistance in hallway  Level of Assistance Standby assist, set-up cues, supervision of patient - no hands on  Assistive Device Front wheel walker  Distance Ambulated (ft) 150 ft  Activity Response Tolerated well  Mobility Referral Yes  $Mobility charge 1 Mobility   Mobility Specialist Progress Note  Pre-Mobility: 87 HR During Mobility: 127 HR Post-Mobility: 78 HR  Pt was EOB and agreeable. Had c/o foot pain throughout ambulation. Returned to bed w/ all needs met and call bell in reach.  [name] Mobility Specialist

## 2022-03-09 NOTE — Progress Notes (Signed)
PROGRESS NOTE    Charles Gray  VQM:[id number] DOB: Dec 13, 1955 DOA: 03/04/2022 PCP: Enid Skeens., MD  44 /M with history of HFpEF (EF 60-65%), PAF on Eliquis, history of PE/DVT, COPD, cirrhosis, HTN, HLD, iron deficiency anemia, GAD, OSA nonadherent to CPAP who presented to the ED for evaluation of dyspnea and lower extremity edema x 10days, 15lb weight gain In ED, labs BNP of 60, creat 0.9, CXR w/ emphysema -He is followed by advanced heart failure team and para medicine -Improving slowly on diuretics  Subjective: Breathing improving, complains of left foot toe and ankle pain  Assessment and Plan:  Acute on chronic diastolic CHF 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  Also has a component of chronic venous insufficiency - Last echo w/  EF 60-65% 07/09/2021. -Diuresing on IV Lasix, he is 10.7 L negative, continue IV Lasix, aldactone, toprol, weight going down -Repeat echo with preserved EF, RV not well-visualized -Reportedly intolerant to Jardiance(GI symptoms) -Continue UNNA boots, ultrasound without ascites -Increase activity as tolerated, PT consult  Gout flare -Has not taken colchicine, ordered prednisone today  Paroxysmal atrial fibrillation (HCC) Sinus rhythm on admission. -Continue Toprol-XL and Eliquis  History of DVT/PE -Continue Eliquis  COPD (chronic obstructive pulmonary disease) ([place]) -Continue as needed albuterol  Obesity OSA (obstructive sleep apnea) -Reportedly intolerant to CPAP  Cirrhosis of liver without ascites (HCC) -Diuretics as above -Continue  lactulose, Aldactone  Anemia of chronic disease Iron deficiency anemia -Iron stores are low, given IV Feraheme 11/24, check CBC tomorrow  Mixed hyperlipidemia Continue pravastatin.  Anxiety -Continue sertraline  Chronic Suboxone use -Resumed  OSA -Reportedly cannot tolerate CPAP, counseled, counseled again  DVT  prophylaxis: apixaban Code Status: FUll code Family Communication: None present Disposition Plan: Home likely 2 to 3 days  Consultants: CHF team   Procedures:   Antimicrobials:    Objective: Vitals:   03/08/22 2038 03/09/22 0531 03/09/22 0535 03/09/22 0914  BP: 139/65 113/70  122/64  Pulse: 71   65  Resp:  18  16  Temp:  97.6 F (36.4 C)  97.9 F (36.6 C)  TempSrc:  Oral  Oral  SpO2:  97%  99%  Weight:   (!) 176.1 kg     Intake/Output Summary (Last 24 hours) at 03/09/2022 0941 Last data filed at 03/09/2022 9326 Gross per 24 hour  Intake 1320 ml  Output 2725 ml  Net -1405 ml   Filed Weights   03/07/22 0515 03/08/22 0124 03/09/22 0535  Weight: (!) 177.5 kg (!) 176.3 kg (!) 176.1 kg    Examination:  General exam: Obese male sitting up in bed, AAOx3, no distress HEENT: Neck obese CVS: S1-S2, regular rhythm Lungs: Clear bilaterally Abdomen: Soft, obese, nontender, bowel sounds present  Extremities: Chronic edema to knees, Unna boots on, mild tenderness of left foot first MTP joint Psychiatry:  Mood & affect appropriate.     Data Reviewed:   CBC: Recent Labs  Lab 03/04/22 1516 03/05/22 0023  WBC 6.5 6.9  NEUTROABS 5.4  --   HGB 9.4* 9.4*  HCT 33.4* 32.5*  MCV 74.6* 72.9*  PLT 137* 712*   Basic Metabolic Panel: Recent Labs  Lab 03/05/22 0023 03/06/22 1002 03/06/22 1004 03/07/22 0037 03/08/22 0046 03/09/22 0037  NA 137  --  138 137 137 137  K 3.3*  --  3.1* 4.1 3.0* 2.9*  CL 97*  --  100 99 99 98  CO2 28  --  27 27  28 26  GLUCOSE 137*  --  122* 121* 108* 123*  BUN 12  --  9 11 8 11   CREATININE 1.09  --  0.89 1.01 0.94 0.85  CALCIUM 8.9  --  8.8* 8.8* 8.9 9.0  MG 2.1 2.1  --  2.2  --   --    GFR: Estimated Creatinine Clearance: 158.2 mL/min (by C-G formula based on SCr of 0.85 mg/dL). Liver Function Tests: Recent Labs  Lab 03/04/22 1516 03/05/22 0023 03/06/22 1004  AST 50* 52* 61*  ALT 19 21 27   ALKPHOS 88 89 86  BILITOT 1.1 1.2  1.2  PROT 7.4 7.6 8.1  ALBUMIN 3.3* 3.4* 3.5   No results for input(s): "LIPASE", "AMYLASE" in the last 168 hours. No results for input(s): "AMMONIA" in the last 168 hours. Coagulation Profile: No results for input(s): "INR", "PROTIME" in the last 168 hours. Cardiac Enzymes: No results for input(s): "CKTOTAL", "CKMB", "CKMBINDEX", "TROPONINI" in the last 168 hours. BNP (last 3 results) No results for input(s): "PROBNP" in the last 8760 hours. HbA1C: No results for input(s): "HGBA1C" in the last 72 hours. CBG: No results for input(s): "GLUCAP" in the last 168 hours. Lipid Profile: No results for input(s): "CHOL", "HDL", "LDLCALC", "TRIG", "CHOLHDL", "LDLDIRECT" in the last 72 hours. Thyroid Function Tests: No results for input(s): "TSH", "T4TOTAL", "FREET4", "T3FREE", "THYROIDAB" in the last 72 hours. Anemia Panel: No results for input(s): "VITAMINB12", "FOLATE", "FERRITIN", "TIBC", "IRON", "RETICCTPCT" in the last 72 hours.  Urine analysis:    Component Value Date/Time   COLORURINE YELLOW 03/04/2022 2000   APPEARANCEUR CLEAR 03/04/2022 2000   LABSPEC 1.010 03/04/2022 2000   PHURINE 8.0 03/04/2022 2000   GLUCOSEU NEGATIVE 03/04/2022 2000   HGBUR NEGATIVE 03/04/2022 2000   BILIRUBINUR NEGATIVE 03/04/2022 2000   KETONESUR NEGATIVE 03/04/2022 2000   PROTEINUR NEGATIVE 03/04/2022 2000   NITRITE NEGATIVE 03/04/2022 2000   LEUKOCYTESUR NEGATIVE 03/04/2022 2000   Sepsis Labs: @LABRCNTIP (procalcitonin:4,lacticidven:4)  ) Recent Results (from the past 240 hour(s))  Resp Panel by RT-PCR (Flu A&B, Covid) Anterior Nasal Swab     Status: None   Collection Time: 03/04/22  3:33 PM   Specimen: Anterior Nasal Swab  Result Value Ref Range Status   SARS Coronavirus 2 by RT PCR NEGATIVE NEGATIVE Final    Comment: (NOTE) SARS-CoV-2 target nucleic acids are NOT DETECTED.  The SARS-CoV-2 RNA is generally detectable in upper respiratory specimens during the acute phase of infection. The  lowest concentration of SARS-CoV-2 viral copies this assay can detect is 138 copies/mL. A negative result does not preclude SARS-Cov-2 infection and should not be used as the sole basis for treatment or other patient management decisions. A negative result may occur with  improper specimen collection/handling, submission of specimen other than nasopharyngeal swab, presence of viral mutation(s) within the areas targeted by this assay, and inadequate number of viral copies(<138 copies/mL). A negative result must be combined with clinical observations, patient history, and epidemiological information. The expected result is Negative.  Fact Sheet for Patients:  EntrepreneurPulse.com.au  Fact Sheet for Healthcare Providers:  IncredibleEmployment.be  This test is no t yet approved or cleared by the [place] FDA and  has been authorized for detection and/or diagnosis of SARS-CoV-2 by FDA under an Emergency Use Authorization (EUA). This EUA will remain  in effect (meaning this test can be used) for the duration of the COVID-19 declaration under Section 564(b)(1) of the Act, 21 U.S.C.section 360bbb-3(b)(1), unless the authorization is terminated  or revoked  sooner.       Influenza A by PCR NEGATIVE NEGATIVE Final   Influenza B by PCR NEGATIVE NEGATIVE Final    Comment: (NOTE) The Xpert Xpress SARS-CoV-2/FLU/RSV plus assay is intended as an aid in the diagnosis of influenza from Nasopharyngeal swab specimens and should not be used as a sole basis for treatment. Nasal washings and aspirates are unacceptable for Xpert Xpress SARS-CoV-2/FLU/RSV testing.  Fact Sheet for Patients: EntrepreneurPulse.com.au  Fact Sheet for Healthcare Providers: IncredibleEmployment.be  This test is not yet approved or cleared by the [place] FDA and has been authorized for detection and/or diagnosis of SARS-CoV-2 by FDA under  an Emergency Use Authorization (EUA). This EUA will remain in effect (meaning this test can be used) for the duration of the COVID-19 declaration under Section 564(b)(1) of the Act, 21 U.S.C. section 360bbb-3(b)(1), unless the authorization is terminated or revoked.  Performed at [facility], [place] [address], [place], [place] [address]      Radiology Studies: No results found.   Scheduled Meds:  apixaban  5 mg Oral BID   buprenorphine-naloxone  1 tablet Sublingual Daily   colchicine  0.6 mg Oral Daily   lactulose  33.33 g Oral BID   metoprolol succinate  25 mg Oral QHS   potassium chloride  60 mEq Oral Q4H   pravastatin  20 mg Oral QHS   sertraline  50 mg Oral Daily   sodium chloride flush  3 mL Intravenous Q12H   spironolactone  25 mg Oral Daily   tamsulosin  0.4 mg Oral Daily   Continuous Infusions:  sodium chloride Stopped (03/08/22 0650)   furosemide 120 mg (03/09/22 0909)     LOS: 5 days    Time spent: 68mn  PDomenic Polite MD [facility]   03/09/2022, 9:41 AM

## 2022-03-09 NOTE — Consult Note (Signed)
   [facility] CM Inpatient Consult   03/09/2022  Charles Gray 11-23-1955 [id number]  [facility] Referral:  Hospital Admission  Primary Care Provider: Enid Skeens., MD is with [facility] which is NOT a [facility] provider [not a primary care provider in network].  Transportation follow up  Insurance: [employer] Medicare SNP - managed by [facility] team  11:50 am Met with the patient at the bedside regarding primary care provider and he endorses Charles Gray as PCP and listed in APL with Jenean Lindau, MD [name] denies is his PCP but Dr. Bridget Hartshorn manages his Suboxone only]  Patient is aware his insurance has benefits for transportation.  He has an appointment assisted by [facility] team for 03/24/2022 noted.   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 Ho  Of note, [facility] Care Management services does not replace or interfere with any services that are arranged by inpatient case management or social work.  For additional questions or referrals please contact:    Plan: Will sign off.  Will update [facility] staff of findings.   Membership roster was used to verify patient status however PCP confirmed to be out of network and has PCS M-F 1pm to 4 pm Monday through Friday - he doesn't know which agency though].  For questions, please call:  Natividad Brood, RN BSN [place]  [phone] business mobile phone Toll free office [phone]  *[place]  [phone] Fax number: [phone] [email or web address]

## 2022-03-09 NOTE — Progress Notes (Signed)
Heart Failure Navigator Progress Note  Assessed for Heart & Vascular TOC clinic readiness.  Patient does not meet criteria due to Advanced Heart Failure Team patient of Dr. [name].     Earnestine Leys, BSN, Clinical cytogeneticist Only

## 2022-03-09 NOTE — Evaluation (Signed)
Physical Therapy Evaluation Patient Details Name: Charles Gray MRN: [id number] DOB: [birth year] Today's Date: 03/09/2022  History of Present Illness  Pt is a 66 yo male admitted with dyspena and fatigue sa well as edema in LEs with L ankel pain. Pt with CHF. PMH: HTN,PE,DVT, cirrhosis, COPD and multiple hospitalizations over the years.  Clinical Impression  Patient presents with decreased mobility due to weakness, decreased activity tolerance and decreased cardiopulmonary endurance.  Currently S for safety with increased time needed for sit to stand and ambulated with rollator in hallway short distance prior to needing seated rest.  Could not attempt stair negotiation today despite getting rollator as pt with SOB, increased HR (very short bout HR 138) and fatigued from mobility earlier with mobility specialist and OT.  Feel he will benefit from skilled PT in the acute setting and from follow up [place] at d/c.      Recommendations for follow up therapy are one component of a multi-disciplinary discharge planning process, led by the attending physician.  Recommendations may be updated based on patient status, additional functional criteria and insurance authorization.  Follow Up Recommendations Home health PT      Assistance Recommended at Discharge Intermittent Supervision/Assistance  Patient can return home with the following  Direct supervision/assist for financial management;Help with stairs or ramp for entrance;Assist for transportation;Assistance with cooking/housework    Equipment Recommendations None recommended by PT  Recommendations for Other Services       Functional Status Assessment Patient has had a recent decline in their functional status and demonstrates the ability to make significant improvements in function in a reasonable and predictable amount of time.     Precautions / Restrictions Precautions Precautions: Fall Precaution Comments: watch HR Restrictions Weight  Bearing Restrictions: No      Mobility  Bed Mobility               General bed mobility comments: up in recliner    Transfers Overall transfer level: Needs assistance Equipment used: Rolling walker (2 wheels) Transfers: Sit to/from Stand, Bed to chair/wheelchair/BSC Sit to Stand: Supervision           General transfer comment: increased time and heavy UE support needed, couple attempts for second time up to stand for BP measurement    Ambulation/Gait Ambulation/Gait assistance: Supervision Gait Distance (Feet): 70 Feet (x 2) Assistive device: Rollator (4 wheels) Gait Pattern/deviations: Trunk flexed, Wide base of support       General Gait Details: raised handles on bari rollator up but still not high enough, pt 6'10" so flexed posture and heavy UE support with ambulation with foot pain; seated rest and HR up to 138 very briefly with ambulation back quickly to 90's.  SpO2 97% ambulated on RA  Stairs            Wheelchair Mobility    Modified Rankin (Stroke Patients Only)       Balance Overall balance assessment: Needs assistance   Sitting balance-Leahy Scale: Good       Standing balance-Leahy Scale: Fair Standing balance comment: taking some steps without device to get around bed.  flexed and antalgic on feet, but no LOB                             Pertinent Vitals/Pain Pain Assessment Pain Assessment: 0-10 Pain Score: 7  Pain Location: back and feet Pain Descriptors / Indicators: Aching, Grimacing, Sore, Throbbing Pain Intervention(s): Monitored during session,  Limited activity within patient's tolerance    Home Living Family/patient expects to be discharged to:: Private residence Living Arrangements: Alone Available Help at Discharge: Personal care attendant;Neighbor;Available PRN/intermittently Type of Home: Apartment Home Access: Stairs to enter Entrance Stairs-Rails: Right;Left;Can reach both Entrance Stairs-Number of  Steps: 20+   Home Layout: One level Home Equipment: Conservation officer, nature (2 wheels);Rollator (4 wheels);Tub bench;Hand held shower head;Toilet riser;Hospital bed;Adaptive equipment (trying to get better mattress for hospital bed since he can't sleep on it as is with thin mattress) Additional Comments: aide assist 3 hrs/daily, x5 days/week. on list to transfer to first floor apartment    Prior Function Prior Level of Function : Needs assist;History of Falls (last six months)       Physical Assist : Mobility (physical);ADLs (physical)     Mobility Comments: use of Rollator for mobility. occasional assist with tub transfers with tub bench. Has to sit on stairs occasionally due to fatigue. x1 fall in past 6 months ADLs Comments: Some issues with transportation lately, used [employer] and they will not come to help down steps, medicaid transportation has not shown 3 times and he missed appointments     Hand Dominance   Dominant Hand: Right    Extremity/Trunk Assessment   Upper Extremity Assessment Upper Extremity Assessment: Defer to OT evaluation    Lower Extremity Assessment Lower Extremity Assessment: Generalized weakness (errythema, lymphedema in feet/legs; difficulty wearing/tolerating even socks)    Cervical / Trunk Assessment Cervical / Trunk Assessment: Kyphotic  Communication   Communication: No difficulties  Cognition Arousal/Alertness: Awake/alert Behavior During Therapy: WFL for tasks assessed/performed Overall Cognitive Status: Within Functional Limits for tasks assessed                                          General Comments General comments (skin integrity, edema, etc.): BP seated 144/81; standing 134/76    Exercises     Assessment/Plan    PT Assessment Patient needs continued PT services  PT Problem List Decreased strength;Decreased balance;Decreased mobility;Decreased activity tolerance       PT Treatment Interventions DME  instruction;Functional mobility training;Balance training;Patient/family education;Gait training;Therapeutic activities;Neuromuscular re-education;Therapeutic exercise;Stair training;Cognitive remediation    PT Goals (Current goals can be found in the Care Plan section)  Acute Rehab PT Goals Patient Stated Goal: to return home PT Goal Formulation: With patient Time For Goal Achievement: 03/23/22 Potential to Achieve Goals: Fair    Frequency Min 3X/week     Co-evaluation               AM-PAC PT "6 Clicks" Mobility  Outcome Measure Help needed turning from your back to your side while in a flat bed without using bedrails?: A Little Help needed moving from lying on your back to sitting on the side of a flat bed without using bedrails?: A Little Help needed moving to and from a bed to a chair (including a wheelchair)?: A Little Help needed standing up from a chair using your arms (e.g., wheelchair or bedside chair)?: None Help needed to walk in hospital room?: None Help needed climbing 3-5 steps with a railing? : Total 6 Click Score: 18    End of Session   Activity Tolerance: Patient limited by pain;Patient limited by fatigue Patient left: in chair   PT Visit Diagnosis: Other abnormalities of gait and mobility (R26.89);Muscle weakness (generalized) (M62.81)    Time: 6659-9357 PT Time  Calculation (min) (ACUTE ONLY): 35 min   Charges:   PT Evaluation $PT Eval Moderate Complexity: 1 Mod PT Treatments $Gait Training: 8-22 mins        Magda Kiel, PT Acute Rehabilitation Services Office:[phone] 03/09/2022   Reginia Naas 03/09/2022, 2:53 PM

## 2022-03-09 NOTE — Progress Notes (Addendum)
Patient ID: Charles Gray, male   DOB: 15-Mar-1956, 66 y.o.   MRN: [id number]     Advanced Heart Failure Rounding Note  PCP-Cardiologist: Loralie Champagne, MD   Subjective:    Good diuresis with high dose IV lasix yesterday. However, weight unchanged.   Scr 0.85, K 2.9.  Started on colchicine for possible gout flare but has been refusing. States he isn't sure he wants to take any new meds, already on too much.   Objective:   Weight Range: (!) 176.1 kg Body mass index is 40.59 kg/m.   Vital Signs:   Temp:  [97.6 F (36.4 C)-97.8 F (36.6 C)] 97.6 F (36.4 C) (11/27 0531) Pulse Rate:  [64-71] 71 (11/26 2038) Resp:  [18-20] 18 (11/27 0531) BP: (112-139)/(58-71) 113/70 (11/27 0531) SpO2:  [97 %-100 %] 97 % (11/27 0531) Weight:  [176.1 kg] 176.1 kg (11/27 0535) Last BM Date : 03/08/22  Weight change: Filed Weights   03/07/22 0515 03/08/22 0124 03/09/22 0535  Weight: (!) 177.5 kg (!) 176.3 kg (!) 176.1 kg    Intake/Output:   Intake/Output Summary (Last 24 hours) at 03/09/2022 0741 Last data filed at 03/09/2022 0550 Gross per 24 hour  Intake 1443 ml  Output 4175 ml  Net -2732 ml      Physical Exam    General:  Sitting up on side of bed eating breakfast. HEENT: normal Neck: supple. JVP difficult d/t thick neck. Carotids 2+ bilat; no bruits.  Cor: PMI nondisplaced. Regular rate & rhythm. No rubs, gallops or murmurs. Lungs: clear Abdomen: obese, soft, nontender, nondistended.  Extremities: no cyanosis, clubbing, rash, 2-3+ edema to knees, + UNNA, right great toe erythematous Neuro: alert & orientedx3, cranial nerves grossly intact. moves all 4 extremities w/o difficulty. Affect pleasant    Telemetry   SR 60s-70s with short runs narrow complex tachycardia  Labs    CBC No results for input(s): "WBC", "NEUTROABS", "HGB", "HCT", "MCV", "PLT" in the last 72 hours. Basic Metabolic Panel Recent Labs    03/06/22 1002 03/06/22 1004 03/07/22 0037 03/08/22 0046  03/09/22 0037  NA  --    < > 137 137 137  K  --    < > 4.1 3.0* 2.9*  CL  --    < > 99 99 98  CO2  --    < > 27 28 26   GLUCOSE  --    < > 121* 108* 123*  BUN  --    < > 11 8 11   CREATININE  --    < > 1.01 0.94 0.85  CALCIUM  --    < > 8.8* 8.9 9.0  MG 2.1  --  2.2  --   --    < > = values in this interval not displayed.   Liver Function Tests Recent Labs    03/06/22 1004  AST 61*  ALT 27  ALKPHOS 86  BILITOT 1.2  PROT 8.1  ALBUMIN 3.5   No results for input(s): "LIPASE", "AMYLASE" in the last 72 hours. Cardiac Enzymes No results for input(s): "CKTOTAL", "CKMB", "CKMBINDEX", "TROPONINI" in the last 72 hours.  BNP: BNP (last 3 results) Recent Labs    11/21/21 1025 12/10/21 1414 03/04/22 1516  BNP 24.3 35.9 60.9    ProBNP (last 3 results) No results for input(s): "PROBNP" in the last 8760 hours.   D-Dimer No results for input(s): "DDIMER" in the last 72 hours. Hemoglobin A1C No results for input(s): "HGBA1C" in the last 72 hours. Fasting Lipid  Panel No results for input(s): "CHOL", "HDL", "LDLCALC", "TRIG", "CHOLHDL", "LDLDIRECT" in the last 72 hours. Thyroid Function Tests No results for input(s): "TSH", "T4TOTAL", "T3FREE", "THYROIDAB" in the last 72 hours.  Invalid input(s): "FREET3"  Other results:   Imaging    No results found.   Medications:     Scheduled Medications:  apixaban  5 mg Oral BID   buprenorphine-naloxone  1 tablet Sublingual Daily   colchicine  0.6 mg Oral Daily   lactulose  33.33 g Oral BID   metoprolol succinate  25 mg Oral QHS   potassium chloride  40 mEq Oral TID   pravastatin  20 mg Oral QHS   sertraline  50 mg Oral Daily   sodium chloride flush  3 mL Intravenous Q12H   spironolactone  25 mg Oral Daily   tamsulosin  0.4 mg Oral Daily    Infusions:  sodium chloride Stopped (03/08/22 0650)   furosemide 120 mg (03/08/22 1727)    PRN Medications: sodium chloride, acetaminophen **OR** acetaminophen, albuterol,  ondansetron **OR** ondansetron (ZOFRAN) IV, senna-docusate, zolpidem    Assessment/Plan   1. Acute on chronic diastolic CHF with suspected RV dysfunction: Echo 7/22 with EF 60-65%, mild LVH, normal RV, unable to estimate PA systolic pressure. Multiple CHF admissions despite aggressive outpatient diuretic regimen and paramedicine.  RHC (7/22) with optimized filling pressures, PCWP 13, CO/CI 7.3/2.4. Received IV lasix 80 mg through paramedicine 11/12/20. RHC (8/22) with normal filling pressures; underwent placement of Cardiomems (dPAP goal 9 mmHg). Re-admitted w/ marked volume overload and slight progression of symptoms 3/23. Echo 3/23 EF 60-65% RV normal this admission.  Echo this admission with EF 55-60%, RV not well-visualized. Admitted with 20 lb weight gain despite no major change in his Cardiomems reading.  - Diuresing well with IV lasix 120 BID. Continue to day. - Replace K aggressively. Requires high dose potassium supplement at home.  - Continue spironolactone 25 daily, he is on eplerenone at home.  - Off Jardiance (GI upset). - Continue Toprol XL 25 mg daily.  - Continue UNNA boots 2. H/o PE/DVT: Suspect due to sedentary lifestyle.  Occurred in 2/21.  V/Q scan in 6/21 did not show evidence for chronic PE.   - Continue Eliquis.  3. OSA: Cannot tolerate CPAP. 4. Anxiety/depression: This is a major issue.   - Continue sertraline 50 mg daily.  5. Toe pain: Colchicine ordered by primary team for gout. He has been refusing doses. Offered short trial with prednisone instead. He isn't sure he wants to take any additional medications and wants time to think about it.  6. Hypertension: Stable. Continue current meds. 7. Atrial fibrillation: Paroxysmal. In NSR today.  - Continue apixaban.  - Continue Toprol XL. 8. Cirrhosis: CT scan in past noted nodular contour of liver, compatible with underlying cirrhosis. In setting of known RV dysfunction and suspect cardiogenic. LFTs ok. EGD 12/22 showed  portal hypertensive gastropathy, no varices.  This admission, abdominal US showed no ascites.  - On lactulose (also takes this for chronic constipation)  - Continue spironolactone.  9. Iron deficiency anemia - Received feraheme this admit  Mobilize   Length of Stay: Pulcifer, LINDSAY N, PA-C  03/09/2022, 7:41 AM  Advanced Heart Failure Team Pager [phone] (M-F; 7a - 5p)  Please contact [facility] for night-coverage after hours (5p -7a ) and weekends on [email or web address]   Patient seen with PA, agree with the above note.   Complains of pain right great toe > other toes.  No dyspnea at rest. Creatinine stable at 0.85 with low K 2.9.  Good diuresis yesterday though ?weight unchanged.   General: NAD Neck: Thick, JVP difficult, no thyromegaly or thyroid nodule.  Lungs: Clear to auscultation bilaterally with normal respiratory effort. CV: Nondisplaced PMI.  Heart regular S1/S2, no S3/S4, no murmur. 1+ chronic edema to knees. Abdomen: Soft, nontender, no hepatosplenomegaly, no distention.  Skin: Venous stasis changes lower legs.  Neurologic: Alert and oriented x 3.  Psych: Normal affect. Extremities: No clubbing or cyanosis.  HEENT: Normal.   I think that we should continue IV Lasix 120 mg bid one more day as creatinine has not bumped.  Would like to see weight down a bit more.    Suspect gout in right great toe, will give burst of prednisone 40 mg daily x 5-7 days.  Check uric acid.   Loralie Champagne 03/09/2022 11:06 AM

## 2022-03-09 NOTE — Evaluation (Signed)
Occupational Therapy Evaluation Patient Details Name: Charles Gray MRN: [id number] DOB: 10/05/1955 Today's Date: 03/09/2022   History of Present Illness Pt is a 66 yo male admitted with dyspena and fatigue sa well as edema in LEs with L ankel pain. Pt with CHF. PMH: HTN,PE,DVT, cirrhosis, COPD and multiple hospitalizations over the years.   Clinical Impression   Pt admitted with the above diagnosis and has the deficits listed below. Pt would benefit from cont OT to increase safety and independence with basic adls and adl transfers so pt can return to his apartment. Pt has an aid 5x week for 3 hours to assist with bathing, dressing and cooking/cleaning. Pt would benefit from large sock aid and would benefit from a main floor apartment as he has 20+ steps to get into his second floor apartment.  Feel pt is a fall risk but states he does not fall much at home. Pt's aid seems to do a lot for him outside of just self care which is helpful. Will continue to see with focus on safety when on his feet, energy conservation ed and LE dressing equipment.       Recommendations for follow up therapy are one component of a multi-disciplinary discharge planning process, led by the attending physician.  Recommendations may be updated based on patient status, additional functional criteria and insurance authorization.   Follow Up Recommendations  Other (comment) (cont with HHAid)     Assistance Recommended at Discharge PRN  Patient can return home with the following A little help with bathing/dressing/bathroom;Assistance with cooking/housework;Assist for transportation;Help with stairs or ramp for entrance    Functional Status Assessment  Patient has had a recent decline in their functional status and demonstrates the ability to make significant improvements in function in a reasonable and predictable amount of time.  Equipment Recommendations  None recommended by OT    Recommendations for Other  Services       Precautions / Restrictions Precautions Precautions: Fall Restrictions Weight Bearing Restrictions: No      Mobility Bed Mobility Overal bed mobility: Modified Independent                  Transfers Overall transfer level: Needs assistance Equipment used: Rolling walker (2 wheels) Transfers: Sit to/from Stand, Bed to chair/wheelchair/BSC Sit to Stand: Supervision Stand pivot transfers: Supervision         General transfer comment: Pt with HR up to 124 whfn fatigued from therapy.      Balance Overall balance assessment: Needs assistance Sitting-balance support: Feet supported Sitting balance-Leahy Scale: Good     Standing balance support: Bilateral upper extremity supported, During functional activity Standing balance-Leahy Scale: Fair Standing balance comment: Pt fatigues quickly when standing which affects balance. Pt has taken one fall in last year.                           ADL either performed or assessed with clinical judgement   ADL Overall ADL's : Needs assistance/impaired Eating/Feeding: Independent;Sitting   Grooming: Wash/dry hands;Oral care;Wash/dry face;Brushing hair;Supervision/safety;Standing Grooming Details (indicate cue type and reason): Pt takes rest breaks in sitting at sink when standing to do grooming. Upper Body Bathing: Set up;Sitting   Lower Body Bathing: Minimal assistance;Sit to/from stand;Cueing for compensatory techniques Lower Body Bathing Details (indicate cue type and reason): Pt has aid that assists at home. Pt does not bathe if aid not there. Upper Body Dressing : Set up;Sitting   Lower Body  Dressing: Moderate assistance;Sit to/from stand;Cueing for compensatory techniques Lower Body Dressing Details (indicate cue type and reason): Pt has aid to assist with dressing. Pt has reacher at home. Does not wear socks on weekends as aid not there to assist. Talked to pt about sock aid. Pts feet very swollen  but will show pt wide sock aid assumin swelling goes down. Toilet Transfer: Supervision/safety;Ambulation;Rolling walker (2 wheels) Toilet Transfer Details (indicate cue type and reason): Pt walked to bathroom with walker and completed toileting with distant supervision. Toileting- Clothing Manipulation and Hygiene: Supervision/safety;Sit to/from stand;Cueing for compensatory techniques       Functional mobility during ADLs: Min guard;Rolling walker (2 wheels) General ADL Comments: Pt has aid at home and uses aid to assist with LE dressing and bathing. Aid also assists with dressing, food prep, cleaning. Pt gets rides from [facility] transportation and has groceries delivered from [place] b/c pt does not drive.     Vision Baseline Vision/History: 0 No visual deficits Ability to See in Adequate Light: 0 Adequate Patient Visual Report: No change from baseline Vision Assessment?: No apparent visual deficits     Perception Perception Perception Tested?: No   Praxis Praxis Praxis tested?: Within functional limits    Pertinent Vitals/Pain Pain Assessment Pain Assessment: 0-10 Pain Score: 7  Pain Location: back and feet Pain Descriptors / Indicators: Aching, Grimacing, Sore, Throbbing Pain Intervention(s): Limited activity within patient's tolerance, Monitored during session, Repositioned     Hand Dominance Right   Extremity/Trunk Assessment Upper Extremity Assessment Upper Extremity Assessment: Overall WFL for tasks assessed   Lower Extremity Assessment Lower Extremity Assessment: Defer to PT evaluation   Cervical / Trunk Assessment Cervical / Trunk Assessment: Kyphotic   Communication Communication Communication: No difficulties   Cognition Arousal/Alertness: Awake/alert Behavior During Therapy: WFL for tasks assessed/performed Overall Cognitive Status: Within Functional Limits for tasks assessed                                       General  Comments  Pt limited by body habitus, decreased balance and SOB    Exercises     Shoulder Instructions      Home Living Family/patient expects to be discharged to:: Private residence Living Arrangements: Alone Available Help at Discharge: Personal care attendant;Neighbor;Available PRN/intermittently Type of Home: Apartment Home Access: Stairs to enter Entrance Stairs-Number of Steps: 20+ Entrance Stairs-Rails: Right;Left;Can reach both Home Layout: One level     Bathroom Shower/Tub: Teacher, early years/pre: Handicapped height Bathroom Accessibility: Yes How Accessible: Accessible via walker Home Equipment: Rolling Walker (2 wheels);Rollator (4 wheels);Tub bench;Hand held shower head;Toilet riser;Hospital bed;Adaptive equipment Adaptive Equipment: Reacher Additional Comments: aide assist 3 hrs/daily, x5 days/week. on list to transfer to first floor apartment      Prior Functioning/Environment Prior Level of Function : Needs assist;History of Falls (last six months)       Physical Assist : Mobility (physical);ADLs (physical)     Mobility Comments: use of Rollator for mobility. occasional assist with tub transfers with tub bench. Has to sit on stairs occasionally due to fatigue. x1 fall in past 6 months ADLs Comments: Able to complete ADLs typically without assist. occasional aide assist with tub transfers, showering and brushing hair due to fatigue. Pt states LB ADL are very difficult and makes him "lose his breath"; takes a "very long time"; peri-care is very difficult; difficulty wears shoes due  to swelling and difficulty reaching feet; Aide assists with household IADLs; used [facility] transport previously but states that stopped @ 1 week before admission; gets neighbor to pick up meds/has difficulty having meds delivered at times        OT Problem List: Decreased activity tolerance;Impaired balance (sitting and/or standing);Decreased knowledge of use of DME or  AE;Increased edema;Obesity      OT Treatment/Interventions: Self-care/ADL training;DME and/or AE instruction;Balance training;Therapeutic activities    OT Goals(Current goals can be found in the care plan section) Acute Rehab OT Goals Patient Stated Goal: to get home but I don't have a ride OT Goal Formulation: With patient Time For Goal Achievement: 03/23/22 Potential to Achieve Goals: Good ADL Goals Pt Will Perform Grooming: with modified independence;standing Pt Will Perform Lower Body Dressing: with modified independence;with adaptive equipment;sit to/from stand Additional ADL Goal #1: Pt will safely walk to commode with walker and complete all toileting with mod I (on high commode)  OT Frequency: Min 2X/week    Co-evaluation              AM-PAC OT "6 Clicks" Daily Activity     Outcome Measure Help from another person eating meals?: None Help from another person taking care of personal grooming?: None Help from another person toileting, which includes using toliet, bedpan, or urinal?: None Help from another person bathing (including washing, rinsing, drying)?: A Lot Help from another person to put on and taking off regular upper body clothing?: A Little Help from another person to put on and taking off regular lower body clothing?: A Lot 6 Click Score: 19   End of Session Equipment Utilized During Treatment: Rolling walker (2 wheels) Nurse Communication: Mobility status  Activity Tolerance: Patient limited by fatigue Patient left: in chair;with call bell/phone within reach  OT Visit Diagnosis: Unsteadiness on feet (R26.81)                Time: 9753-0051 OT Time Calculation (min): 36 min Charges:  OT General Charges $OT Visit: 1 Visit OT Evaluation $OT Eval Moderate Complexity: 1 Mod OT Treatments $Self Care/Home Management : 8-22 mins  Glenford Peers 03/09/2022, 1:34 PM

## 2022-03-10 ENCOUNTER — Other Ambulatory Visit (HOSPITAL_COMMUNITY): Payer: Self-pay

## 2022-03-10 ENCOUNTER — Encounter (HOSPITAL_COMMUNITY): Payer: Self-pay | Admitting: Internal Medicine

## 2022-03-10 DIAGNOSIS — I5033 Acute on chronic diastolic (congestive) heart failure: Secondary | ICD-10-CM | POA: Diagnosis not present

## 2022-03-10 LAB — CBC
HCT: 32.1 % — ABNORMAL LOW (ref 39.0–52.0)
Hemoglobin: 9.3 g/dL — ABNORMAL LOW (ref 13.0–17.0)
MCH: 21.5 pg — ABNORMAL LOW (ref 26.0–34.0)
MCHC: 29 g/dL — ABNORMAL LOW (ref 30.0–36.0)
MCV: 74.3 fL — ABNORMAL LOW (ref 80.0–100.0)
Platelets: 141 10*3/uL — ABNORMAL LOW (ref 150–400)
RBC: 4.32 MIL/uL (ref 4.22–5.81)
RDW: 19 % — ABNORMAL HIGH (ref 11.5–15.5)
WBC: 8.8 10*3/uL (ref 4.0–10.5)
nRBC: 0 % (ref 0.0–0.2)

## 2022-03-10 LAB — BASIC METABOLIC PANEL
Anion gap: 9 (ref 5–15)
Anion gap: 13 (ref 5–15)
BUN: 18 mg/dL (ref 8–23)
BUN: 17 mg/dL (ref 8–23)
CO2: 27 mmol/L (ref 22–32)
CO2: 29 mmol/L (ref 22–32)
Calcium: 9 mg/dL (ref 8.9–10.3)
Calcium: 9.6 mg/dL (ref 8.9–10.3)
Chloride: 101 mmol/L (ref 98–111)
Chloride: 97 mmol/L — ABNORMAL LOW (ref 98–111)
Creatinine, Ser: 0.92 mg/dL (ref 0.61–1.24)
Creatinine, Ser: 1.09 mg/dL (ref 0.61–1.24)
GFR, Estimated: >60 mL/min (ref >60)
GFR, Estimated: >60 mL/min (ref >60)
Glucose, Bld: 142 mg/dL — ABNORMAL HIGH (ref 70–99)
Glucose, Bld: 178 mg/dL — ABNORMAL HIGH (ref 70–99)
Potassium: 3.9 mmol/L (ref 3.5–5.1)
Potassium: 2.9 mmol/L — ABNORMAL LOW (ref 3.5–5.1)
Sodium: 137 mmol/L (ref 135–145)
Sodium: 139 mmol/L (ref 135–145)

## 2022-03-10 LAB — URIC ACID: Uric Acid, Serum: 7.9 mg/dL (ref 3.7–8.6)

## 2022-03-10 LAB — MAGNESIUM: Magnesium: 2.1 mg/dL (ref 1.7–2.4)

## 2022-03-10 MED ORDER — METOPROLOL SUCCINATE ER 25 MG PO TB24: 25.0000 mg | ORAL_TABLET | Freq: Every day | ORAL | 11 refills | Status: DC

## 2022-03-10 MED ORDER — PREDNISONE 20 MG PO TABS: 30.0000 mg | ORAL_TABLET | Freq: Once | ORAL | Status: DC

## 2022-03-10 MED ORDER — METOLAZONE 2.5 MG PO TABS
2.5000 mg | ORAL_TABLET | Freq: Once | ORAL | Status: AC
  Administered 2022-03-10: 2.5 mg via ORAL
  Filled 2022-03-10: qty 1

## 2022-03-10 MED ORDER — PANTOPRAZOLE SODIUM 40 MG PO TBEC
40.0000 mg | DELAYED_RELEASE_TABLET | Freq: Two times a day (BID) | ORAL | Status: DC
  Administered 2022-03-11: 40 mg via ORAL
  Filled 2022-03-10: qty 1

## 2022-03-10 MED ORDER — POLYETHYLENE GLYCOL 3350 17 G PO PACK: 17.0000 g | PACK | ORAL | Status: DC | PRN

## 2022-03-10 MED ORDER — POTASSIUM CHLORIDE CRYS ER 20 MEQ PO TBCR
60.0000 meq | EXTENDED_RELEASE_TABLET | ORAL | Status: AC
  Administered 2022-03-10 – 2022-03-11 (×3): 60 meq via ORAL
  Filled 2022-03-10 (×3): qty 3

## 2022-03-10 MED ORDER — POTASSIUM CHLORIDE CRYS ER 20 MEQ PO TBCR
40.0000 meq | EXTENDED_RELEASE_TABLET | ORAL | Status: DC
  Administered 2022-03-10 (×2): 40 meq via ORAL
  Filled 2022-03-10 (×2): qty 2

## 2022-03-10 MED ORDER — GABAPENTIN 400 MG PO CAPS
800.0000 mg | ORAL_CAPSULE | Freq: Three times a day (TID) | ORAL | Status: DC
  Administered 2022-03-11: 800 mg via ORAL
  Filled 2022-03-10: qty 2

## 2022-03-10 MED ORDER — ONDANSETRON 4 MG PO TBDP: 4.0000 mg | ORAL_TABLET | Freq: Three times a day (TID) | ORAL | Status: DC | PRN

## 2022-03-10 NOTE — Progress Notes (Signed)
Physical Therapy Treatment Patient Details Name: Kentrail Shew MRN: [id number] DOB: 1955/12/22 Today's Date: 03/10/2022   History of Present Illness Pt is a 66 yo male admitted with dyspena and fatigue sa well as edema in LEs with L ankel pain. Pt with CHF. PMH: HTN,PE,DVT, cirrhosis, COPD and multiple hospitalizations over the years.    PT Comments    Pt with continued progress towards acute goals with session focused on gait with rollator and stair training for increased activity tolerance, LE strength, improved balance and safety with mobility for ultimate d/c to home. Pt grossly min guard for all mobility improving to supervision with increase in gait distance. Pt demonstrating ascent/descent of full flight of stairs in stairwell with min guard for safety and light cues for sequencing. Pt continues to require increased seated recovery breaks throughout session secondary to fatigue and continues to be limited by weakness and decreased activity tolerance. Pt continues to benefit from skilled PT services to progress toward functional mobility goals.   HR 66-79 bpm during mobility   Recommendations for follow up therapy are one component of a multi-disciplinary discharge planning process, led by the attending physician.  Recommendations may be updated based on patient status, additional functional criteria and insurance authorization.  Follow Up Recommendations  Home health PT     Assistance Recommended at Discharge Intermittent Supervision/Assistance  Patient can return home with the following Direct supervision/assist for financial management;Help with stairs or ramp for entrance;Assist for transportation;Assistance with cooking/housework   Equipment Recommendations  None recommended by PT    Recommendations for Other Services       Precautions / Restrictions Precautions Precautions: Fall Precaution Comments: watch HR Restrictions Weight Bearing Restrictions: No      Mobility  Bed Mobility Overal bed mobility: Modified Independent             General bed mobility comments: seated EOB pre and post session    Transfers Overall transfer level: Needs assistance Equipment used: Rollator (4 wheels) Transfers: Sit to/from Stand, Bed to chair/wheelchair/BSC Sit to Stand: Min guard Stand pivot transfers: Min guard         General transfer comment: increased time and heavy UE support needed, min guard to stand and turn to sit/stand with rollator    Ambulation/Gait Ambulation/Gait assistance: Supervision Gait Distance (Feet): 100 Feet (x2) Assistive device: Rollator (4 wheels) Gait Pattern/deviations: Trunk flexed, Wide base of support       General Gait Details: raised handles on bari rollator up but still not high enough, pt 6'10" so flexed posture and heavy UE support with ambulation with foot pain; seated rest secondary to fatigue, HR stable up to 77bpm   Stairs Stairs: Yes Stairs assistance: Min guard Stair Management: Two rails, Step to pattern, Forwards Number of Stairs: 10 (flight) General stair comments: up/down flight in stariwell, no LOB, standing rest at top secondary to fatigue, 25% VCs for sequencing   Wheelchair Mobility    Modified Rankin (Stroke Patients Only)       Balance Overall balance assessment: Needs assistance Sitting-balance support: Feet supported Sitting balance-Leahy Scale: Good     Standing balance support: Bilateral upper extremity supported, During functional activity Standing balance-Leahy Scale: Fair Standing balance comment: taking some steps without device to get around bed.  flexed and antalgic on feet, but no LOB                            Cognition Arousal/Alertness: Awake/alert Behavior  During Therapy: WFL for tasks assessed/performed Overall Cognitive Status: Within Functional Limits for tasks assessed                                           Exercises      General Comments        Pertinent Vitals/Pain Pain Assessment Pain Assessment: 0-10 Pain Score: 7  Pain Location: bil legs Pain Descriptors / Indicators: Aching, Grimacing, Sore Pain Intervention(s): Monitored during session, Limited activity within patient's tolerance    Home Living                          Prior Function            PT Goals (current goals can now be found in the care plan section) Acute Rehab PT Goals Patient Stated Goal: to return home PT Goal Formulation: With patient Time For Goal Achievement: 03/23/22    Frequency    Min 3X/week      PT Plan      Co-evaluation              AM-PAC PT "6 Clicks" Mobility   Outcome Measure  Help needed turning from your back to your side while in a flat bed without using bedrails?: A Little Help needed moving from lying on your back to sitting on the side of a flat bed without using bedrails?: A Little Help needed moving to and from a bed to a chair (including a wheelchair)?: A Little Help needed standing up from a chair using your arms (e.g., wheelchair or bedside chair)?: None Help needed to walk in hospital room?: None Help needed climbing 3-5 steps with a railing? : A Little 6 Click Score: 20    End of Session Equipment Utilized During Treatment: Gait belt Activity Tolerance: Patient tolerated treatment well Patient left: in bed;with call bell/phone within reach;with nursing/sitter in room (seated EOB) Nurse Communication: Mobility status PT Visit Diagnosis: Other abnormalities of gait and mobility (R26.89);Muscle weakness (generalized) (M62.81)     Time: 8159-4707 PT Time Calculation (min) (ACUTE ONLY): 29 min  Charges:  $Gait Training: 23-37 mins                     Kelly Eisler R. PTA Acute Rehabilitation Services Office: [place] 03/10/2022, 11:40 AM

## 2022-03-10 NOTE — Progress Notes (Addendum)
Patient ID: Charles Gray, male   DOB: Jun 21, 1955, 66 y.o.   MRN: [id number]     Advanced Heart Failure Rounding Note  PCP-Cardiologist: Loralie Champagne, MD   Subjective:    2.5L UOP yesterday + unmeasured void. Weight up 2 lb. Feels like he is retaining more fluid.  Right great toe pain about the same. Says prednisone made him feel sick. Wants to try colchicine instead.     Objective:   Weight Range: (!) 177 kg Body mass index is 40.8 kg/m.   Vital Signs:   Temp:  [97.9 F (36.6 C)-98 F (36.7 C)] 97.9 F (36.6 C) (11/28 0411) Pulse Rate:  [55-65] 55 (11/28 0411) Resp:  [16-18] 16 (11/28 0411) BP: (122-153)/(59-72) 126/70 (11/28 0411) SpO2:  [97 %-99 %] 98 % (11/28 0411) Weight:  [177 kg] 177 kg (11/28 0411) Last BM Date : 03/09/22  Weight change: Filed Weights   03/08/22 0124 03/09/22 0535 03/10/22 0411  Weight: (!) 176.3 kg (!) 176.1 kg (!) 177 kg    Intake/Output:   Intake/Output Summary (Last 24 hours) at 03/10/2022 7902 Last data filed at 03/09/2022 2314 Gross per 24 hour  Intake 1440 ml  Output 2450 ml  Net -1010 ml      Physical Exam   General: Fatigued appearing. Sitting up on side of bed. HEENT: normal Neck: supple. JVP difficult d/t neck size. Carotids 2+ bilat; no bruits.  Cor: PMI nondisplaced. Regular rate & rhythm. No rubs, gallops or murmurs. Lungs: clear Abdomen: obese, soft, nontender, + distended.  Extremities: no cyanosis, clubbing, rash, 1-2+ edema, + UNNA boots, right great toe swollen and erythematous Neuro: alert & orientedx3, cranial nerves grossly intact. moves all 4 extremities w/o difficulty. Affect flat    Telemetry   SR 50s-60s  Labs    CBC Recent Labs    03/10/22 0045  WBC 8.8  HGB 9.3*  HCT 32.1*  MCV 74.3*  PLT 409*   Basic Metabolic Panel Recent Labs    03/09/22 0037 03/10/22 0045  NA 137 137  K 2.9* 3.9  CL 98 101  CO2 26 27  GLUCOSE 123* 142*  BUN 11 18  CREATININE 0.85 0.92  CALCIUM 9.0 9.0   MG  --  2.1   Liver Function Tests No results for input(s): "AST", "ALT", "ALKPHOS", "BILITOT", "PROT", "ALBUMIN" in the last 72 hours.  No results for input(s): "LIPASE", "AMYLASE" in the last 72 hours. Cardiac Enzymes No results for input(s): "CKTOTAL", "CKMB", "CKMBINDEX", "TROPONINI" in the last 72 hours.  BNP: BNP (last 3 results) Recent Labs    11/21/21 1025 12/10/21 1414 03/04/22 1516  BNP 24.3 35.9 60.9    ProBNP (last 3 results) No results for input(s): "PROBNP" in the last 8760 hours.   D-Dimer No results for input(s): "DDIMER" in the last 72 hours. Hemoglobin A1C No results for input(s): "HGBA1C" in the last 72 hours. Fasting Lipid Panel No results for input(s): "CHOL", "HDL", "LDLCALC", "TRIG", "CHOLHDL", "LDLDIRECT" in the last 72 hours. Thyroid Function Tests No results for input(s): "TSH", "T4TOTAL", "T3FREE", "THYROIDAB" in the last 72 hours.  Invalid input(s): "FREET3"  Other results:   Imaging    No results found.   Medications:     Scheduled Medications:  apixaban  5 mg Oral BID   buprenorphine-naloxone  1 tablet Sublingual Daily   colchicine  0.6 mg Oral Daily   lactulose  33.33 g Oral BID   metoprolol succinate  25 mg Oral QHS   pravastatin  20  mg Oral QHS   predniSONE  40 mg Oral Q breakfast   sertraline  50 mg Oral Daily   sodium chloride flush  3 mL Intravenous Q12H   spironolactone  25 mg Oral Daily   tamsulosin  0.4 mg Oral Daily    Infusions:  sodium chloride Stopped (03/08/22 0650)   furosemide 120 mg (03/09/22 1827)    PRN Medications: sodium chloride, acetaminophen **OR** acetaminophen, albuterol, ondansetron **OR** ondansetron (ZOFRAN) IV, senna-docusate, zolpidem    Assessment/Plan   1. Acute on chronic diastolic CHF with suspected RV dysfunction: Echo 7/22 with EF 60-65%, mild LVH, normal RV, unable to estimate PA systolic pressure. Multiple CHF admissions despite aggressive outpatient diuretic regimen and  paramedicine.  RHC (7/22) with optimized filling pressures, PCWP 13, CO/CI 7.3/2.4. Received IV lasix 80 mg through paramedicine 11/12/20. RHC (8/22) with normal filling pressures; underwent placement of Cardiomems (dPAP goal 9 mmHg). Re-admitted w/ marked volume overload and slight progression of symptoms 3/23. Echo 3/23 EF 60-65% RV normal this admission.  Echo this admission with EF 55-60%, RV not well-visualized. Admitted with 20 lb weight gain despite no major change in his Cardiomems reading.  - Diuresis has slowed with IV lasix 120 BID. Still appears volume up and weight up 2 lb from yesterday. Give 2.5 mg metolazone today. - Replace K aggressively. Requires high dose potassium supplement at home.  - Continue spironolactone 25 daily, he is on eplerenone at home.  - Off Jardiance (GI upset). - Continue Toprol XL 25 mg daily.  - Continue UNNA boots 2. H/o PE/DVT: Suspect due to sedentary lifestyle.  Occurred in 2/21.  V/Q scan in 6/21 did not show evidence for chronic PE.   - Continue Eliquis.  3. OSA: Cannot tolerate CPAP. 4. Anxiety/depression: This is a major issue.   - Continue sertraline 50 mg daily.  5. Toe pain: Colchicine ordered by primary team for gout. He had been refusing doses. Took prednisone yesterday and states it made him feel awful. Would rather take Colchicine instead. 6. Hypertension: Stable. Continue current meds. 7. Atrial fibrillation: Paroxysmal. In NSR today.  - Continue apixaban.  - Continue Toprol XL. 8. Cirrhosis: CT scan in past noted nodular contour of liver, compatible with underlying cirrhosis. In setting of known RV dysfunction and suspect cardiogenic. LFTs ok. EGD 12/22 showed portal hypertensive gastropathy, no varices.  This admission, abdominal US showed no ascites.  - On lactulose (also takes this for chronic constipation)  - Continue spironolactone.  9. Iron deficiency anemia - Received feraheme this admit  Mobilize HH PT recommended at  discharge.   Length of Stay: 6  [facility], [place], PA-C  03/10/2022, 7:12 AM  Advanced Heart Failure Team Pager [phone] (M-F; 7a - 5p)  Please contact [facility] for night-coverage after hours (5p -7a ) and weekends on [email or web address]   Patient seen with PA, agree with the above note.   Did not diurese as well yesterday, weight actually up.  Creatinine stable at 0.92.   Toes still hurt but uric acid normal.  Was reticent to take colchicine or prednisone.   General: NAD Neck: Thick, JVP difficult, no thyromegaly or thyroid nodule.  Lungs: Clear to auscultation bilaterally with normal respiratory effort. CV: Nondisplaced PMI.  Heart regular S1/S2, no S3/S4, no murmur.  1+ chronic edema to knees.  Abdomen: Soft, nontender, no hepatosplenomegaly, no distention.  Skin: Venous stasis changes lower legs.   Neurologic: Alert and oriented x 3.  Psych: Normal affect. Extremities: No clubbing or  cyanosis.  HEENT: Normal.   Exam is difficult for volume but weight up a bit and creatinine remains stable.  Would continue Lasix 120 mg IV bid today and add metolazone 2.5 x 1 this morning.  Replace K.   Not sure his toe symptoms are gout, uric acid normal.  He does not want to take prednisone or colchicine.   Loralie Champagne 03/10/2022 11:06 AM

## 2022-03-10 NOTE — Progress Notes (Signed)
   03/10/22 1546  Mobility  Activity Ambulated with assistance in hallway  Level of Assistance Standby assist, set-up cues, supervision of patient - no hands on  Assistive Device Front wheel walker  Distance Ambulated (ft) 70 ft  Activity Response Tolerated well  Mobility Referral Yes  $Mobility charge 1 Mobility   Mobility Specialist Progress Note  Pt was EOB and agreeable. Had no c/o pain throughout ambulation. Returned to bed w/ all needs met and call bell in reach.   Charles Gray Mobility Specialist  Please contact via SecureChat or Rehab office at [phone]

## 2022-03-10 NOTE — TOC Initial Note (Addendum)
Transition of Care [facility]) - Initial/Assessment Note    Patient Details  Name: Charles Gray MRN: [id number] Date of Birth: [birth year]  Transition of Care [facility]) CM/SW Contact:    Erenest Rasher, RN Phone Number: [phone] 03/10/2022, 2:55 PM  Clinical Narrative:                 HF TOC CM spoke to pt and states he had [facility] in the past for [facility]. Contacted [facility] rep, Kelly with referral. Spoke to pt and states he used [employer], program with [employer] delivered to his home. Contacted [employer], # [phone] spoke to [facility], Mom's rep. States she will start on his dc day. Requested CM give call back on day of dc. Pt has RW, Rollator, CPAP (does not wear), shower seat, scale, and blood pressure cuff at home. Pt has HF Paramedicing. He has aide that comes 3 hours per day, M-F from [place]. Attending messaged for [facility] orders.   Will need PTAR home.  Expected Discharge Plan: [place] Barriers to Discharge: Continued Medical Work up   Patient Goals and CMS Choice Patient states their goals for this hospitalization and ongoing recovery are:: would like to remain independent CMS Medicare.gov Compare Post Acute Care list provided to:: Patient Choice offered to / list presented to : Patient  Expected Discharge Plan and Services Expected Discharge Plan: [place]   Discharge Planning Services: CM Consult Post Acute Care Choice: [place] arrangements for the past 2 months: Apartment                           HH Arranged: RN, PT HH Agency: [place] Date [place]: 03/10/22 Time [place]: 70 Representative spoke with at [place]: Otila Kluver  Prior Living Arrangements/Services Living arrangements for the past 2 months: [place] with:: Self Patient language and need for interpreter reviewed:: Yes Do you feel safe going back to the place where you live?:  Yes      Need for Family Participation in Patient Care: No (Comment) Care giver support system in place?: Yes (comment)   Criminal Activity/Legal Involvement Pertinent to Current Situation/Hospitalization: No - Comment as needed  Activities of Daily Living Home Assistive Devices/Equipment: Walker (specify type) ADL Screening (condition at time of admission) Patient's cognitive ability adequate to safely complete daily activities?: Yes Is the patient deaf or have difficulty hearing?: No Does the patient have difficulty seeing, even when wearing glasses/contacts?: Yes Does the patient have difficulty concentrating, remembering, or making decisions?: Yes Patient able to express need for assistance with ADLs?: Yes Does the patient have difficulty dressing or bathing?: Yes (Helper) Independently performs ADLs?: Yes (appropriate for developmental age) Communication: Independent Dressing (OT): Independent Grooming: Independent Feeding: Independent Bathing: Independent Toileting: Independent In/Out Bed: Independent Walks in Home: Independent Does the patient have difficulty walking or climbing stairs?: Yes Weakness of Legs: Both Weakness of Arms/Hands: None  Permission Sought/Granted Permission sought to share information with : Case Manager, Family Supports, PCP Permission granted to share information with : Yes, Verbal Permission Granted  Share Information with NAME: Kadrian Partch     Permission granted to share info w Relationship: son  Permission granted to share info w Contact Information: [phone]  Emotional Assessment Appearance:: Appears stated age Attitude/Demeanor/Rapport: Engaged Affect (typically observed): Accepting Orientation: : Oriented to Self, Oriented to Place, Oriented to  Time, Oriented to Situation   Psych Involvement: No (comment)  Admission diagnosis:  Generalized weakness [R53.1] Dyspnea, unspecified type [R06.00] Edema, unspecified type  [R60.9] Acute on chronic heart failure with preserved ejection fraction (HFpEF) (HCC) [I50.33] Patient Active Problem List   Diagnosis Date Noted   Acute on chronic heart failure with preserved ejection fraction (HFpEF) ([place]) 03/04/2022   Dyslipidemia 07/17/2021   Lymphedema 07/08/2021   Melena    Gastric polyps    UGIB (upper gastrointestinal bleed) 04/06/2021   Acute encephalopathy 12/21/2020   COPD (chronic obstructive pulmonary disease) ([place]) 12/21/2020   Chronic diastolic (congestive) heart failure ([place]) 11/14/2020   Syncope 10/28/2020   CHF (congestive heart failure) ([place]) 10/28/2020   Chronic venous stasis dermatitis of both lower extremities 08/27/2020   Cellulitis of both lower extremities 08/14/2020   Iron deficiency anemia 08/14/2020   GERD without esophagitis 06/10/2020   Atrial fibrillation with rapid ventricular response ([place]) 03/24/2020   Acute on chronic right heart failure ([place]) 01/27/2020   Acute respiratory failure with hypoxia (HCC)    Fatty liver disease, nonalcoholic    Shock ([place])    Personal history of DVT (deep vein thrombosis) 12/26/2019   Chronic anticoagulation 12/26/2019   Palpitations 12/26/2019   Paroxysmal atrial fibrillation ([place]) 12/23/2019   Heart failure, systolic, acute ([place]) 64/40/3474   Acute on chronic congestive heart failure (HCC)    Acute on chronic diastolic CHF (congestive heart failure) ([place]) 08/23/2019   Chest pain 08/23/2019   Lactic acidosis 08/23/2019   History of pulmonary embolism 08/12/2019   Atrial tachycardia 08/12/2019   Occasional tremors 08/12/2019   OSA (obstructive sleep apnea) 08/12/2019   AKI (acute kidney injury) ([place]) 08/08/2019   Acute on chronic right-sided heart failure ([place]) 08/06/2019   Acute pulmonary embolism ([place]) 06/08/2019   Normocytic anemia 06/08/2019   Pulmonary emboli ([place]) 06/08/2019   Chronic heart failure with preserved ejection fraction ([place]) 05/30/2019   Class 2 severe obesity due to excess  calories with serious comorbidity and body mass index (BMI) of 39.0 to 39.9 in adult ([place]) 05/30/2019   Morbid obesity with BMI of 40.0-44.9, adult ([place]) 05/30/2019   Hyponatremia    Drug induced constipation    Peripheral edema    Hypotension due to drugs    Hypoalbuminemia due to protein-calorie malnutrition (HCC)    Thrombocytopenia (HCC)    Anemia of chronic disease    Cirrhosis of liver without ascites ([place])    Chronic pain syndrome    Debility 03/13/2019   Portal hypertensive gastropathy ([place]) 03/06/2019   Dyspnea    GIB (gastrointestinal bleeding) 03/04/2019   Mixed hyperlipidemia 01/24/2019   Insomnia 01/24/2019   Hyperlipidemia 01/24/2019   Essential hypertension 01/24/2019   Lumbar disc herniation 01/24/2019   PCP:  Enid Skeens., MD Pharmacy:   [facility], [place] [address] Phone: [phone] Fax: [phone]     Social Determinants of Health (SDOH) Interventions Housing Interventions: Intervention Not Indicated Transportation Interventions: Other (Comment) Utilities Interventions: Intervention Not Indicated  Readmission Risk Interventions    09/26/2020    9:27 AM 06/12/2020    5:03 PM 03/27/2020   11:51 AM  Readmission Risk Prevention Plan  Transportation Screening Complete Complete Complete  Medication Review Press photographer) Complete Complete Complete  PCP or Specialist appointment within 3-5 days of discharge Complete  Complete  HRI or Home Care Consult Complete Complete Complete  SW Recovery Care/Counseling Consult Complete Complete Complete  Palliative Care Screening Not Applicable Not Applicable Not Applicable  Skilled Nursing Facility Not Applicable Not Applicable Not Applicable

## 2022-03-10 NOTE — Progress Notes (Signed)
PROGRESS NOTE    Charles Gray  JXB:[id number] DOB: 12/08/1955 DOA: 03/04/2022 PCP: Enid Skeens., MD  39 /M with history of HFpEF (EF 60-65%), PAF on Eliquis, history of PE/DVT, COPD, cirrhosis, HTN, HLD, iron deficiency anemia, GAD, OSA nonadherent to CPAP who presented to the ED for evaluation of dyspnea and lower extremity edema x 10days, 15lb weight gain In ED, labs BNP of 60, creat 0.9, CXR w/ emphysema -He is followed by advanced heart failure team and para medicine -Improving slowly on diuretics -Complicated by mild gout flare  Subjective: Breathing improving overall, finally took prednisone yesterday afternoon and then felt jittery, toe feels better today, still hurts, adamantly declines culture seen  Assessment and Plan:  Acute on chronic diastolic CHF 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  Also has a component of chronic venous insufficiency - Last echo w/  EF 60-65% 07/09/2021. -Diuresing on IV Lasix, he is 11.6 L negative, continue IV Lasix, aldactone, toprol, weight going down -Repeat echo with preserved EF, RV not well-visualized -Reportedly intolerant to Jardiance(GI symptoms) -Continue UNNA boots, ultrasound without ascites -Ambulated in the halls yesterday  Gout flare -Improving, took 1 dose of prednisone finally yesterday, agreeable to repeat X1, uric acid can be normal in an acute flare  Paroxysmal atrial fibrillation (HCC) Sinus rhythm on admission. -Continue Toprol-XL and Eliquis  History of DVT/PE -Continue Eliquis  COPD (chronic obstructive pulmonary disease) ([place]) -Continue as needed albuterol  Obesity OSA (obstructive sleep apnea) -Reportedly intolerant to CPAP  Cirrhosis of liver without ascites (HCC) -Diuretics as above -Continue  lactulose, Aldactone  Anemia of chronic disease Iron deficiency anemia -Iron stores are low, given IV Feraheme 11/24,   Mixed  hyperlipidemia Continue pravastatin.  Anxiety -Continue sertraline  Chronic Suboxone use -Resumed  OSA -Reportedly cannot tolerate CPAP, counseled, counseled again  DVT prophylaxis: apixaban Code Status: FUll code Family Communication: None present Disposition Plan: Home likely 48 hours  Consultants: CHF team   Procedures:   Antimicrobials:    Objective: Vitals:   03/09/22 2011 03/10/22 0238 03/10/22 0411 03/10/22 1103  BP: (!) 153/59  126/70 111/64  Pulse: 60  (!) 55 61  Resp: 18  16 17   Temp: 98 F (36.7 C)  97.9 F (36.6 C) 97.9 F (36.6 C)  TempSrc:   Oral Oral  SpO2: 97%  98% 96%  Weight:   (!) 177 kg   Height:  6' 10"  (2.083 m)      Intake/Output Summary (Last 24 hours) at 03/10/2022 1137 Last data filed at 03/10/2022 1057 Gross per 24 hour  Intake 1440 ml  Output 2675 ml  Net -1235 ml   Filed Weights   03/08/22 0124 03/09/22 0535 03/10/22 0411  Weight: (!) 176.3 kg (!) 176.1 kg (!) 177 kg    Examination:  General exam: Obese male sitting up in bed, AAOx3, no distress HEENT: Neck obese unable to assess JVD CVS: S1-S2, regular rhythm Lungs: Clear bilaterally Abdomen: Soft, obese, nontender, bowel sounds present Extremities: Chronic edema to his knees, Unna boots on, mild tenderness of left foot first MTP joint Psychiatry:  Mood & affect appropriate.     Data Reviewed:   CBC: Recent Labs  Lab 03/04/22 1516 03/05/22 0023 03/10/22 0045  WBC 6.5 6.9 8.8  NEUTROABS 5.4  --   --   HGB 9.4* 9.4* 9.3*  HCT 33.4* 32.5* 32.1*  MCV 74.6* 72.9* 74.3*  PLT 137* 145* 130*   Basic Metabolic Panel:  Recent Labs  Lab 03/05/22 0023 03/06/22 1002 03/06/22 1004 03/07/22 0037 03/08/22 0046 03/09/22 0037 03/10/22 0045  NA 137  --  138 137 137 137 137  K 3.3*  --  3.1* 4.1 3.0* 2.9* 3.9  CL 97*  --  100 99 99 98 101  CO2 28  --  27 27 28 26 27   GLUCOSE 137*  --  122* 121* 108* 123* 142*  BUN 12  --  9 11 8 11 18   CREATININE 1.09  --  0.89  1.01 0.94 0.85 0.92  CALCIUM 8.9  --  8.8* 8.8* 8.9 9.0 9.0  MG 2.1 2.1  --  2.2  --   --  2.1   GFR: Estimated Creatinine Clearance: 146.6 mL/min (by C-G formula based on SCr of 0.92 mg/dL). Liver Function Tests: Recent Labs  Lab 03/04/22 1516 03/05/22 0023 03/06/22 1004  AST 50* 52* 61*  ALT 19 21 27   ALKPHOS 88 89 86  BILITOT 1.1 1.2 1.2  PROT 7.4 7.6 8.1  ALBUMIN 3.3* 3.4* 3.5   No results for input(s): "LIPASE", "AMYLASE" in the last 168 hours. No results for input(s): "AMMONIA" in the last 168 hours. Coagulation Profile: No results for input(s): "INR", "PROTIME" in the last 168 hours. Cardiac Enzymes: No results for input(s): "CKTOTAL", "CKMB", "CKMBINDEX", "TROPONINI" in the last 168 hours. BNP (last 3 results) No results for input(s): "PROBNP" in the last 8760 hours. HbA1C: No results for input(s): "HGBA1C" in the last 72 hours. CBG: No results for input(s): "GLUCAP" in the last 168 hours. Lipid Profile: No results for input(s): "CHOL", "HDL", "LDLCALC", "TRIG", "CHOLHDL", "LDLDIRECT" in the last 72 hours. Thyroid Function Tests: No results for input(s): "TSH", "T4TOTAL", "FREET4", "T3FREE", "THYROIDAB" in the last 72 hours. Anemia Panel: No results for input(s): "VITAMINB12", "FOLATE", "FERRITIN", "TIBC", "IRON", "RETICCTPCT" in the last 72 hours.  Urine analysis:    Component Value Date/Time   COLORURINE YELLOW 03/04/2022 2000   APPEARANCEUR CLEAR 03/04/2022 2000   LABSPEC 1.010 03/04/2022 2000   PHURINE 8.0 03/04/2022 2000   GLUCOSEU NEGATIVE 03/04/2022 2000   HGBUR NEGATIVE 03/04/2022 2000   BILIRUBINUR NEGATIVE 03/04/2022 2000   KETONESUR NEGATIVE 03/04/2022 2000   PROTEINUR NEGATIVE 03/04/2022 2000   NITRITE NEGATIVE 03/04/2022 2000   LEUKOCYTESUR NEGATIVE 03/04/2022 2000   Sepsis Labs: @LABRCNTIP (procalcitonin:4,lacticidven:4)  ) Recent Results (from the past 240 hour(s))  Resp Panel by RT-PCR (Flu A&B, Covid) Anterior Nasal Swab     Status:  None   Collection Time: 03/04/22  3:33 PM   Specimen: Anterior Nasal Swab  Result Value Ref Range Status   SARS Coronavirus 2 by RT PCR NEGATIVE NEGATIVE Final    Comment: (NOTE) SARS-CoV-2 target nucleic acids are NOT DETECTED.  The SARS-CoV-2 RNA is generally detectable in upper respiratory specimens during the acute phase of infection. The lowest concentration of SARS-CoV-2 viral copies this assay can detect is 138 copies/mL. A negative result does not preclude SARS-Cov-2 infection and should not be used as the sole basis for treatment or other patient management decisions. A negative result may occur with  improper specimen collection/handling, submission of specimen other than nasopharyngeal swab, presence of viral mutation(s) within the areas targeted by this assay, and inadequate number of viral copies(<138 copies/mL). A negative result must be combined with clinical observations, patient history, and epidemiological information. The expected result is Negative.  Fact Sheet for Patients:  EntrepreneurPulse.com.au  Fact Sheet for Healthcare Providers:  IncredibleEmployment.be  This test is no t  yet approved or cleared by the [place] and  has been authorized for detection and/or diagnosis of SARS-CoV-2 by FDA under an Emergency Use Authorization (EUA). This EUA will remain  in effect (meaning this test can be used) for the duration of the COVID-19 declaration under Section 564(b)(1) of the Act, 21 U.S.C.section 360bbb-3(b)(1), unless the authorization is terminated  or revoked sooner.       Influenza A by PCR NEGATIVE NEGATIVE Final   Influenza B by PCR NEGATIVE NEGATIVE Final    Comment: (NOTE) The Xpert Xpress SARS-CoV-2/FLU/RSV plus assay is intended as an aid in the diagnosis of influenza from Nasopharyngeal swab specimens and should not be used as a sole basis for treatment. Nasal washings and aspirates are unacceptable  for Xpert Xpress SARS-CoV-2/FLU/RSV testing.  Fact Sheet for Patients: EntrepreneurPulse.com.au  Fact Sheet for Healthcare Providers: IncredibleEmployment.be  This test is not yet approved or cleared by the [place] FDA and has been authorized for detection and/or diagnosis of SARS-CoV-2 by FDA under an Emergency Use Authorization (EUA). This EUA will remain in effect (meaning this test can be used) for the duration of the COVID-19 declaration under Section 564(b)(1) of the Act, 21 U.S.C. section 360bbb-3(b)(1), unless the authorization is terminated or revoked.  Performed at [facility], [place] [address] [place], [place] [address]      Radiology Studies: No results found.   Scheduled Meds:  apixaban  5 mg Oral BID   buprenorphine-naloxone  1 tablet Sublingual Daily   lactulose  33.33 g Oral BID   metoprolol succinate  25 mg Oral QHS   potassium chloride  40 mEq Oral Q4H   pravastatin  20 mg Oral QHS   sertraline  50 mg Oral Daily   sodium chloride flush  3 mL Intravenous Q12H   spironolactone  25 mg Oral Daily   tamsulosin  0.4 mg Oral Daily   Continuous Infusions:  sodium chloride Stopped (03/08/22 0650)   furosemide 120 mg (03/10/22 1053)     LOS: 6 days    Time spent: 54mn  PDomenic Polite MD [facility]   03/10/2022, 11:37 AM

## 2022-03-10 NOTE — Progress Notes (Signed)
Patient says that he for sure received his 2nd dose of lasix for the day around 4:30p. The 10pm dose is due and patient does not want to take it because he says it is a mistake since he already received the dose. I checked the computer and showed the patient that a second dose was not given/charted. I also called the previous nurse on the phone and she stated that she did not give it. Patient is adamant that he already had his 2nd dose of the day and wants to hold off on the 10pm dose for fear or receiving too much lasix as it will affect his kidneys and potassium levels.  Dose not given charted.

## 2022-03-10 NOTE — Progress Notes (Signed)
Patient is asking about home medication (protonix and gabapentin). Paged provider to see if it can be added to [facility].

## 2022-03-11 DIAGNOSIS — I5033 Acute on chronic diastolic (congestive) heart failure: Secondary | ICD-10-CM | POA: Diagnosis not present

## 2022-03-11 LAB — BASIC METABOLIC PANEL
Anion gap: 19 — ABNORMAL HIGH (ref 5–15)
BUN: 19 mg/dL (ref 8–23)
CO2: 24 mmol/L (ref 22–32)
Calcium: 9.4 mg/dL (ref 8.9–10.3)
Chloride: 93 mmol/L — ABNORMAL LOW (ref 98–111)
Creatinine, Ser: 0.91 mg/dL (ref 0.61–1.24)
GFR, Estimated: >60 mL/min (ref >60)
Glucose, Bld: 167 mg/dL — ABNORMAL HIGH (ref 70–99)
Potassium: 3.7 mmol/L (ref 3.5–5.1)
Sodium: 136 mmol/L (ref 135–145)

## 2022-03-11 LAB — MAGNESIUM: Magnesium: 2.1 mg/dL (ref 1.7–2.4)

## 2022-03-11 MED ORDER — TORSEMIDE 20 MG PO TABS: 120.0000 mg | ORAL_TABLET | Freq: Two times a day (BID) | ORAL | Status: DC

## 2022-03-11 MED ORDER — METOLAZONE 2.5 MG PO TABS: ORAL_TABLET | ORAL | 0 refills | Status: DC

## 2022-03-11 MED ORDER — LACTULOSE ENCEPHALOPATHY 10 GM/15ML PO SOLN: 20.0000 g | Freq: Two times a day (BID) | ORAL | Status: AC

## 2022-03-11 MED ORDER — TORSEMIDE 100 MG PO TABS: 100.0000 mg | ORAL_TABLET | Freq: Two times a day (BID) | ORAL | Status: DC

## 2022-03-11 MED ORDER — POTASSIUM CHLORIDE CRYS ER 20 MEQ PO TBCR
60.0000 meq | EXTENDED_RELEASE_TABLET | Freq: Three times a day (TID) | ORAL | Status: DC
  Administered 2022-03-11: 60 meq via ORAL
  Filled 2022-03-11: qty 3

## 2022-03-11 NOTE — Progress Notes (Addendum)
Patient ID: Charles Gray, male   DOB: 1956-03-16, 66 y.o.   MRN: [id number]     Advanced Heart Failure Rounding Note  PCP-Cardiologist: Loralie Champagne, MD   Subjective:    Yesterday diuresed with IV lasix and metolazone. Weight down another 4 pounds.   Feeling a little better. Denies SOB. ? Refused night time dose of IV lasix.    Objective:   Weight Range: (!) 175.1 kg Body mass index is 40.36 kg/m.   Vital Signs:   Temp:  [97.7 F (36.5 C)-97.9 F (36.6 C)] 97.7 F (36.5 C) (11/29 0442) Pulse Rate:  [55-69] 69 (11/29 0810) Resp:  [17-18] 18 (11/29 0810) BP: (111-161)/(58-75) 124/62 (11/29 0810) SpO2:  [96 %-100 %] 96 % (11/29 0442) Weight:  [175.1 kg] 175.1 kg (11/29 0442) Last BM Date : 03/10/22  Weight change: Filed Weights   03/09/22 0535 03/10/22 0411 03/11/22 0442  Weight: (!) 176.1 kg (!) 177 kg (!) 175.1 kg    Intake/Output:   Intake/Output Summary (Last 24 hours) at 03/11/2022 0908 Last data filed at 03/11/2022 0845 Gross per 24 hour  Intake 1873.92 ml  Output 7000 ml  Net -5126.08 ml      Physical Exam  General:  No resp difficulty HEENT: normal Neck: supple. JVP 6-7  Carotids 2+ bilat; no bruits. No lymphadenopathy or thryomegaly appreciated. Cor: PMI nondisplaced. Regular rate & rhythm. No rubs, gallops or murmurs. Lungs: clear Abdomen: soft, nontender, nondistended. No hepatosplenomegaly. No bruits or masses. Good bowel sounds. Extremities: no cyanosis, clubbing, rash, edema. R and LLE unna boots.  Neuro: alert & orientedx3, cranial nerves grossly intact. moves all 4 extremities w/o difficulty. Affect pleasant    Telemetry   SR/SB 50-60s   Labs    CBC Recent Labs    03/10/22 0045  WBC 8.8  HGB 9.3*  HCT 32.1*  MCV 74.3*  PLT 034*   Basic Metabolic Panel Recent Labs    03/10/22 0045 03/10/22 1408 03/11/22 0056  NA 137 139 136  K 3.9 2.9* 3.7  CL 101 97* 93*  CO2 27 29 24   GLUCOSE 142* 178* 167*  BUN 18 17 19    CREATININE 0.92 1.09 0.91  CALCIUM 9.0 9.6 9.4  MG 2.1  --  2.1   Liver Function Tests No results for input(s): "AST", "ALT", "ALKPHOS", "BILITOT", "PROT", "ALBUMIN" in the last 72 hours.  No results for input(s): "LIPASE", "AMYLASE" in the last 72 hours. Cardiac Enzymes No results for input(s): "CKTOTAL", "CKMB", "CKMBINDEX", "TROPONINI" in the last 72 hours.  BNP: BNP (last 3 results) Recent Labs    11/21/21 1025 12/10/21 1414 03/04/22 1516  BNP 24.3 35.9 60.9    ProBNP (last 3 results) No results for input(s): "PROBNP" in the last 8760 hours.   D-Dimer No results for input(s): "DDIMER" in the last 72 hours. Hemoglobin A1C No results for input(s): "HGBA1C" in the last 72 hours. Fasting Lipid Panel No results for input(s): "CHOL", "HDL", "LDLCALC", "TRIG", "CHOLHDL", "LDLDIRECT" in the last 72 hours. Thyroid Function Tests No results for input(s): "TSH", "T4TOTAL", "T3FREE", "THYROIDAB" in the last 72 hours.  Invalid input(s): "FREET3"  Other results:   Imaging    No results found.   Medications:     Scheduled Medications:  apixaban  5 mg Oral BID   buprenorphine-naloxone  1 tablet Sublingual Daily   gabapentin  800 mg Oral TID   lactulose  33.33 g Oral BID   metoprolol succinate  25 mg Oral QHS   pantoprazole  40 mg Oral BID AC   pravastatin  20 mg Oral QHS   sertraline  50 mg Oral Daily   sodium chloride flush  3 mL Intravenous Q12H   spironolactone  25 mg Oral Daily   tamsulosin  0.4 mg Oral Daily    Infusions:  sodium chloride 10 mL/hr at 03/11/22 0555   furosemide 120 mg (03/11/22 0558)    PRN Medications: sodium chloride, acetaminophen **OR** acetaminophen, albuterol, ondansetron **OR** ondansetron (ZOFRAN) IV, polyethylene glycol, senna-docusate, zolpidem    Assessment/Plan   1. Acute on chronic diastolic CHF with suspected RV dysfunction: Echo 7/22 with EF 60-65%, mild LVH, normal RV, unable to estimate PA systolic pressure.  Multiple CHF admissions despite aggressive outpatient diuretic regimen and paramedicine.  RHC (7/22) with optimized filling pressures, PCWP 13, CO/CI 7.3/2.4. Received IV lasix 80 mg through paramedicine 11/12/20. RHC (8/22) with normal filling pressures; underwent placement of Cardiomems (dPAP goal 9 mmHg). Re-admitted w/ marked volume overload and slight progression of symptoms 3/23. Echo 3/23 EF 60-65% RV normal this admission.  Echo this admission with EF 55-60%, RV not well-visualized. Admitted with 20 lb weight gain despite no major change in his Cardiomems reading.  - Volume status improved. Stop IV lasix. Start torsemide 100 mg twice a day and metolazone 2.5 mg M-W-F-S - Continue spironolactone 25 daily, he is on eplerenone at home.  - Off Jardiance (GI upset). - Continue Toprol XL 25 mg daily.  - Continue UNNA boots 2. H/o PE/DVT: Suspect due to sedentary lifestyle.  Occurred in 2/21.  V/Q scan in 6/21 did not show evidence for chronic PE.   - Continue Eliquis.  3. OSA: Cannot tolerate CPAP. 4. Anxiety/depression: This is a major issue.   - Continue sertraline 50 mg daily.  5. Toe pain: Colchicine ordered by primary team for gout. He had been refusing doses. Took prednisone and states it made him feel awful. Would rather take Colchicine instead. Per primary  6. Hypertension: Stable. Continue current meds. 7. Atrial fibrillation: Paroxysmal. In SR.  - Continue apixaban.  - Continue Toprol XL. 8. Cirrhosis: CT scan in past noted nodular contour of liver, compatible with underlying cirrhosis. In setting of known RV dysfunction and suspect cardiogenic. LFTs ok. EGD 12/22 showed portal hypertensive gastropathy, no varices.  This admission, abdominal US showed no ascites.  - On lactulose (also takes this for chronic constipation)  - Continue spironolactone.  9. Iron deficiency anemia - Received feraheme this admit   [place] PT recommended at discharge. HF Paramedicine to pick back up at  discharge.   Length of Stay: [place], NP  03/11/2022, 9:08 AM  Advanced Heart Failure Team Pager [phone] (M-F; 7a - 5p)  Please contact [facility] for night-coverage after hours (5p -7a ) and weekends on [email or web address]   Pt seen with NP, agree with the above note.   Good diuresis yesterday, weight down 4 lbs. Breathing better.   General: NAD, obese Neck: Thick, JVP difficult, no thyromegaly or thyroid nodule.  Lungs: Clear to auscultation bilaterally with normal respiratory effort. CV: Nondisplaced PMI.  Heart regular S1/S2, no S3/S4, no murmur.  Chronic edema to knees.  Abdomen: Soft, nontender, no hepatosplenomegaly, no distention.  Skin: Venous stasis changes in legs.  Neurologic: Alert and oriented x 3.  Psych: Normal affect. Extremities: No clubbing or cyanosis.  HEENT: Normal.   Good diuresis yesterday with weight down.  Think we can switch back over to po regimen, will use torsemide 100 mg bid  and will have him take metolazone 4 days a week (up from 3 days a week) => Mon/Wed/Fri/Sat. From my standpoint, he could go home with CHF clinic followup.   Loralie Champagne 03/11/2022 9:35 AM

## 2022-03-11 NOTE — Progress Notes (Signed)
   03/11/22 1100  Mobility  Activity Ambulated with assistance in hallway  Level of Assistance Standby assist, set-up cues, supervision of patient - no hands on  Assistive Device Front wheel walker  Distance Ambulated (ft) 130 ft  Activity Response Tolerated well  Mobility Referral Yes  $Mobility charge 1 Mobility   Mobility Specialist Progress Note  Pt was in bed and agreeable. Had c/o of foot pain. Returned to bed w/ all needs met and call bell in reach.   [name] Mobility Specialist  Please contact via SecureChat or Rehab office at [phone]

## 2022-03-11 NOTE — Progress Notes (Signed)
Occupational Therapy Treatment Patient Details Name: Renaldo Gornick MRN: [id number] DOB: November 20, 1955 Today's Date: 03/11/2022   History of present illness Pt is a 66 yo male admitted with dyspena and fatigue sa well as edema in LEs with L ankel pain. Pt with CHF. PMH: HTN,PE,DVT, cirrhosis, COPD and multiple hospitalizations over the years.   OT comments  Pt making good progress toward all goals and overall at supervision level with most adls outside of LE dressing. Pt has an aide to assist during the week but does need to be able to dress self on weekends. Pt provided with sock aid and has a reacher at home to assist with adls. Will continue to see to reach mod I level of care with most adls.   Recommendations for follow up therapy are one component of a multi-disciplinary discharge planning process, led by the attending physician.  Recommendations may be updated based on patient status, additional functional criteria and insurance authorization.    Follow Up Recommendations  No OT follow up     Assistance Recommended at Discharge PRN  Patient can return home with the following  A little help with bathing/dressing/bathroom;Assistance with cooking/housework;Assist for transportation   Equipment Recommendations  None recommended by OT    Recommendations for Other Services      Precautions / Restrictions Precautions Precautions: Fall Precaution Comments: watch HR Restrictions Weight Bearing Restrictions: No       Mobility Bed Mobility Overal bed mobility: Modified Independent             General bed mobility comments: no physical assist needed    Transfers Overall transfer level: Needs assistance Equipment used: Rolling walker (2 wheels) Transfers: Sit to/from Stand, Bed to chair/wheelchair/BSC Sit to Stand: Supervision Stand pivot transfers: Supervision         General transfer comment: Pt transferred from bed to walker with assist of only bedrails this  morning. Pt has a rollator at home but only uses it with therapy. Pt uses walker in house and out in community bc it is lighter and less cumbersome.     Balance Overall balance assessment: Needs assistance Sitting-balance support: Feet supported Sitting balance-Leahy Scale: Good     Standing balance support: Bilateral upper extremity supported, During functional activity Standing balance-Leahy Scale: Fair Standing balance comment: taking some steps without device to get around bed.  flexed and antalgic on feet, but no LOB                           ADL either performed or assessed with clinical judgement   ADL Overall ADL's : Needs assistance/impaired Eating/Feeding: Independent;Sitting   Grooming: Wash/dry hands;Wash/dry face;Oral care;Modified independent;Standing Grooming Details (indicate cue type and reason): Pt requires rest breaks in sitting when standing for extended amount of time but does not require any assist.             Lower Body Dressing: Minimal assistance;Sit to/from stand;Cueing for compensatory techniques;With adaptive equipment Lower Body Dressing Details (indicate cue type and reason): Pt has reacher at home and now has been provided with sock aid. Pt wears ted hose at home and has been educated in use of sock aid to assist when aid is not there. Toilet Transfer: Supervision/safety;Ambulation;Rolling walker (2 wheels) Toilet Transfer Details (indicate cue type and reason): Pt walked to bathroom with walker. Pt is accustomed to leaving walker outside bathroom door. Educated pt on why this can be unsafe but it is such a  routine for him and his bathroom is small that this may be an ok option for pt at home. Toileting- Clothing Manipulation and Hygiene: Supervision/safety;Sit to/from stand;Cueing for compensatory techniques       Functional mobility during ADLs: Supervision/safety;Rollator (4 wheels) General ADL Comments: Pt at supervision level to mod I  with most adls outside of LE dressing. Will continue to address this bc it is a concern on weekend days when aid is not there.    Extremity/Trunk Assessment Upper Extremity Assessment Upper Extremity Assessment: Overall WFL for tasks assessed   Lower Extremity Assessment Lower Extremity Assessment: Defer to PT evaluation        Vision   Vision Assessment?: No apparent visual deficits   Perception Perception Perception: Within Functional Limits   Praxis Praxis Praxis: Intact    Cognition Arousal/Alertness: Awake/alert Behavior During Therapy: WFL for tasks assessed/performed Overall Cognitive Status: Within Functional Limits for tasks assessed                                          Exercises      Shoulder Instructions       General Comments Pt improved over tx on Monday with balance and independence with adls.    Pertinent Vitals/ Pain       Pain Assessment Pain Assessment: No/denies pain  Home Living                                          Prior Functioning/Environment              Frequency  Min 2X/week        Progress Toward Goals  OT Goals(current goals can now be found in the care plan section)  Progress towards OT goals: Progressing toward goals  Acute Rehab OT Goals Patient Stated Goal: to get home soon OT Goal Formulation: With patient Time For Goal Achievement: 03/23/22 Potential to Achieve Goals: Good ADL Goals Pt Will Perform Grooming: with modified independence;standing Pt Will Perform Lower Body Dressing: with modified independence;with adaptive equipment;sit to/from stand Additional ADL Goal #1: Pt will safely walk to commode with walker and complete all toileting with mod I (on high commode)  Plan Discharge plan remains appropriate    Co-evaluation                 AM-PAC OT "6 Clicks" Daily Activity     Outcome Measure   Help from another person eating meals?: None Help from  another person taking care of personal grooming?: None Help from another person toileting, which includes using toliet, bedpan, or urinal?: None Help from another person bathing (including washing, rinsing, drying)?: A Lot Help from another person to put on and taking off regular upper body clothing?: None Help from another person to put on and taking off regular lower body clothing?: A Little 6 Click Score: 21    End of Session Equipment Utilized During Treatment: Rolling walker (2 wheels)  OT Visit Diagnosis: Unsteadiness on feet (R26.81)   Activity Tolerance Patient tolerated treatment well   Patient Left in bed;with call bell/phone within reach   Nurse Communication Mobility status        Time: 7616-0737 OT Time Calculation (min): 23 min  Charges: OT General Charges $OT Visit: 1 Visit OT Treatments $  Self Care/Home Management : 23-37 mins   Glenford Peers 03/11/2022, 8:39 AM

## 2022-03-11 NOTE — Progress Notes (Signed)
CSW acknowledging consult for pt requesting meals on wheels; [facility], The case manager has provided pt with meals on wheels information.

## 2022-03-11 NOTE — Progress Notes (Signed)
Pt received lasix ivpb @0558 . Will call pharmacy to re-time med.

## 2022-03-11 NOTE — TOC Transition Note (Addendum)
Transition of Care [facility]) - CM/SW Discharge Note   Patient Details  Name: Charles Gray MRN: [id number] Date of Birth: 1956/02/20  Transition of Care [facility]) CM/SW Contact:  Zenon Mayo, RN Phone Number: 03/11/2022, 11:31 AM   Clinical Narrative:    Patient is for dc today, PTAR scheduled. Kelly with [place] notified.      Barriers to Discharge: Continued Medical Work up   Patient Goals and CMS Choice Patient states their goals for this hospitalization and ongoing recovery are:: would like to remain independent CMS Medicare.gov Compare Post Acute Care list provided to:: Patient Choice offered to / list presented to : Patient  Discharge Placement                       Discharge Plan and Services   Discharge Planning Services: CM Consult Post Acute Care Choice: Home Health                    HH Arranged: RN, PT [facility] Agency: [place] Date [place]: 03/10/22 Time Veteran: 1452 Representative spoke with at [place]: Otila Kluver  Social Determinants of Health (SDOH) Interventions Housing Interventions: Intervention Not Indicated Transportation Interventions: Other (Comment) Utilities Interventions: Intervention Not Indicated   Readmission Risk Interventions    09/26/2020    9:27 AM 06/12/2020    5:03 PM 03/27/2020   11:51 AM  Readmission Risk Prevention Plan  Transportation Screening Complete Complete Complete  Medication Review Press photographer) Complete Complete Complete  PCP or Specialist appointment within 3-5 days of discharge Complete  Complete  HRI or Home Care Consult Complete Complete Complete  SW Recovery Care/Counseling Consult Complete Complete Complete  Palliative Care Screening Not Applicable Not Applicable Not [place] Not Applicable Not Applicable Not Applicable

## 2022-03-11 NOTE — Plan of Care (Signed)
  Problem: Education: Goal: Ability to demonstrate management of disease process will improve Outcome: Progressing   Problem: Education: Goal: Ability to verbalize understanding of medication therapies will improve Outcome: Progressing   Problem: Clinical Measurements: Goal: Ability to maintain clinical measurements within normal limits will improve Outcome: Progressing

## 2022-03-12 DIAGNOSIS — G894 Chronic pain syndrome: Secondary | ICD-10-CM | POA: Diagnosis not present

## 2022-03-12 DIAGNOSIS — I890 Lymphedema, not elsewhere classified: Secondary | ICD-10-CM | POA: Diagnosis not present

## 2022-03-12 DIAGNOSIS — I480 Paroxysmal atrial fibrillation: Secondary | ICD-10-CM | POA: Diagnosis not present

## 2022-03-12 DIAGNOSIS — I5032 Chronic diastolic (congestive) heart failure: Secondary | ICD-10-CM | POA: Diagnosis not present

## 2022-03-12 DIAGNOSIS — M5126 Other intervertebral disc displacement, lumbar region: Secondary | ICD-10-CM | POA: Diagnosis not present

## 2022-03-12 DIAGNOSIS — I872 Venous insufficiency (chronic) (peripheral): Secondary | ICD-10-CM | POA: Diagnosis not present

## 2022-03-12 DIAGNOSIS — D509 Iron deficiency anemia, unspecified: Secondary | ICD-10-CM | POA: Diagnosis not present

## 2022-03-12 DIAGNOSIS — I110 Hypertensive heart disease with heart failure: Secondary | ICD-10-CM | POA: Diagnosis not present

## 2022-03-12 DIAGNOSIS — J449 Chronic obstructive pulmonary disease, unspecified: Secondary | ICD-10-CM | POA: Diagnosis not present

## 2022-03-12 NOTE — TOC CM/SW Note (Addendum)
11/30/203 902 am HF TOC CM contacted [employer] to have restart his meals at home. States his meals will start on 03/18/2022, they will call him on today. [place], Heart Failure TOC CM [phone]

## 2022-03-13 ENCOUNTER — Other Ambulatory Visit (HOSPITAL_COMMUNITY): Payer: Self-pay

## 2022-03-13 MED ORDER — EPLERENONE 50 MG PO TABS: 100.0000 mg | ORAL_TABLET | Freq: Every day | ORAL | 6 refills | Status: DC

## 2022-03-13 MED ORDER — PRAVASTATIN SODIUM 20 MG PO TABS: 20.0000 mg | ORAL_TABLET | Freq: Every day | ORAL | 3 refills | Status: DC

## 2022-03-17 ENCOUNTER — Other Ambulatory Visit (HOSPITAL_COMMUNITY): Payer: Self-pay

## 2022-03-17 NOTE — Progress Notes (Signed)
Paramedicine Encounter    Patient ID: Charles Gray, male    DOB: 1955/12/25, 66 y.o.   MRN: [id number]   Arrived for home visit for Charles Gray who reports to be feeling okay but complains of general overall pain and fatigue. He reports he is feeling much better this week than before his hospital admission. He has been compliant with his medications over the last week.   Vitals and assessment obtained.  WT- 378lbs BP- 132/70 HR- 79 O2- 98% RR- 16  Lungs- clear  Edema- minimal.   Had his legs wrapped in the hospital and he just removed them yesterday- he has [place] coming out on Friday for re-wrapping. This has benefited him so much as his legs are the smallest I have seen them since working with him. He reports his legs feel better but he does report having some knee pain and pain in legs on ROM.   I reviewed meds and confirmed same, filled pill box for one week. Refills as noted called into [facility]: -Eliquis   We reviewed upcoming appointments and confirmed same. He is aware of his transportation coming at 8:18 on 03/24/22 for clinic appointment. I reminded him to bring meds and pill box as I will meet him there. He agreed with plan.   HF education discussed with diet, med compliance and exercise goals.   Home visit complete. I will plan to see Charles Gray in one week in clinic.   [name], [place] 03/17/2022   Patient Care Team: Enid Skeens., MD as PCP - General (Family Medicine) Larey Dresser, MD as PCP - Cardiology (Cardiology) Jorge Ny, LCSW as Social Worker (Licensed Clinical Social Worker)  Patient Active Problem List   Diagnosis Date Noted   Acute on chronic heart failure with preserved ejection fraction (HFpEF) ([place]) 03/04/2022   Dyslipidemia 07/17/2021   Lymphedema 07/08/2021   Melena    Gastric polyps    UGIB (upper gastrointestinal bleed) 04/06/2021   Acute encephalopathy 12/21/2020   COPD (chronic obstructive pulmonary disease)  ([place]) 12/21/2020   Chronic diastolic (congestive) heart failure ([place]) 11/14/2020   Syncope 10/28/2020   CHF (congestive heart failure) ([place]) 10/28/2020   Chronic venous stasis dermatitis of both lower extremities 08/27/2020   Cellulitis of both lower extremities 08/14/2020   Iron deficiency anemia 08/14/2020   GERD without esophagitis 06/10/2020   Atrial fibrillation with rapid ventricular response ([place]) 03/24/2020   Acute on chronic right heart failure ([place]) 01/27/2020   Acute respiratory failure with hypoxia (HCC)    Fatty liver disease, nonalcoholic    Shock ([place])    Personal history of DVT (deep vein thrombosis) 12/26/2019   Chronic anticoagulation 12/26/2019   Palpitations 12/26/2019   Paroxysmal atrial fibrillation ([place]) 12/23/2019   Heart failure, systolic, acute ([place]) 38/75/6433   Acute on chronic congestive heart failure (HCC)    Acute on chronic diastolic CHF (congestive heart failure) ([place]) 08/23/2019   Chest pain 08/23/2019   Lactic acidosis 08/23/2019   History of pulmonary embolism 08/12/2019   Atrial tachycardia 08/12/2019   Occasional tremors 08/12/2019   OSA (obstructive sleep apnea) 08/12/2019   AKI (acute kidney injury) ([place]) 08/08/2019   Acute on chronic right-sided heart failure ([place]) 08/06/2019   Acute pulmonary embolism ([place]) 06/08/2019   Normocytic anemia 06/08/2019   Pulmonary emboli ([place]) 06/08/2019   Chronic heart failure with preserved ejection fraction ([place]) 05/30/2019   Class 2 severe obesity due to excess calories with serious comorbidity and body mass index (BMI)  of 39.0 to 39.9 in adult [facility]) 05/30/2019   Morbid obesity with BMI of 40.0-44.9, adult ([place]) 05/30/2019   Hyponatremia    Drug induced constipation    Peripheral edema    Hypotension due to drugs    Hypoalbuminemia due to protein-calorie malnutrition (HCC)    Thrombocytopenia (HCC)    Anemia of chronic disease    Cirrhosis of liver without ascites (HCC)    Chronic pain syndrome     Debility 03/13/2019   Portal hypertensive gastropathy ([place]) 03/06/2019   Dyspnea    GIB (gastrointestinal bleeding) 03/04/2019   Mixed hyperlipidemia 01/24/2019   Insomnia 01/24/2019   Hyperlipidemia 01/24/2019   Essential hypertension 01/24/2019   Lumbar disc herniation 01/24/2019    Current Outpatient Medications:    apixaban (ELIQUIS) 5 MG TABS tablet, Take 1 tablet (5 mg total) by mouth 2 (two) times daily., Disp: 60 tablet, Rfl: 3   Buprenorphine HCl-Naloxone HCl 8-2 MG FILM, Place 1 Film under the tongue in the morning and at bedtime., Disp: , Rfl:    eplerenone (INSPRA) 50 MG tablet, Take 2 tablets (100 mg total) by mouth daily., Disp: 60 tablet, Rfl: 6   gabapentin (NEURONTIN) 800 MG tablet, Take 1 tablet (800 mg total) by mouth 3 (three) times daily., Disp: 90 tablet, Rfl: 1   lactulose, encephalopathy, (CHRONULAC) 10 GM/15ML SOLN, Take 30 mLs (20 g total) by mouth 2 (two) times daily., Disp: , Rfl:    metolazone (ZAROXOLYN) 2.5 MG tablet, Take 1 tab, 4days a week on Monday, Wednesday  Fridays and Saturdays, Disp: 30 tablet, Rfl: 0   metoprolol succinate (TOPROL XL) 25 MG 24 hr tablet, Take 1 tablet (25 mg total) by mouth at bedtime., Disp: 30 tablet, Rfl: 11   ondansetron (ZOFRAN-ODT) 4 MG disintegrating tablet, Take 4 mg by mouth every 8 (eight) hours as needed for nausea or vomiting (dissolve orally)., Disp: , Rfl:    pantoprazole (PROTONIX) 40 MG tablet, Take 40 mg by mouth 2 (two) times daily before a meal., Disp: , Rfl:    polyethylene glycol (MIRALAX / GLYCOLAX) 17 g packet, Take 17 g by mouth as needed for mild constipation (mix and drink as directed)., Disp: , Rfl:    potassium chloride SA (KLOR-CON M) 20 MEQ tablet, Take 5 tablets (100 mEq total) by mouth 3 (three) times daily. Take one tablet extra (40 meq) with metolazone. (Patient taking differently: Take 40-120 mEq by mouth See admin instructions. Take 120 mEq by mouth in the morning, 100 mEq at noontime, and 100 mEq in  the evening. Take an additional 40 mEq by mouth on Mon/Wed/Fri when taking Metolazone.), Disp: 463 tablet, Rfl: 6   pravastatin (PRAVACHOL) 20 MG tablet, Take 1 tablet (20 mg total) by mouth daily., Disp: 30 tablet, Rfl: 3   sertraline (ZOLOFT) 50 MG tablet, Take 1 tablet (50 mg total) by mouth daily., Disp: 30 tablet, Rfl: 3   tamsulosin (FLOMAX) 0.4 MG CAPS capsule, Take 1 capsule (0.4 mg total) by mouth daily. Additional refills will need to come from PCP (Patient taking differently: Take 0.4 mg by mouth daily.), Disp: 30 capsule, Rfl: 0   torsemide (DEMADEX) 100 MG tablet, Take 1 tablet (100 mg total) by mouth twice daily. (Patient taking differently: Take 100 mg by mouth See admin instructions. Take 100 mg by mouth in the morning and evening), Disp: 60 tablet, Rfl: 3   triamcinolone (KENALOG) 0.025 % ointment, Apply 1 application. topically 2 (two) times daily. (Patient taking differently: Apply  1 application  topically See admin instructions. Apply to both lower legs/feet 2 times a day), Disp: 30 g, Rfl: 0   VENTOLIN HFA 108 (90 Base) MCG/ACT inhaler, Inhale 1 puff into the lungs every 4 (four) hours as needed for shortness of breath. (Patient taking differently: Inhale 2 puffs into the lungs every 4 (four) hours as needed for shortness of breath or wheezing.), Disp: 6.7 g, Rfl: 1   zolpidem (AMBIEN) 10 MG tablet, Take 1 tablet (10 mg total) by mouth at bedtime as needed for sleep. (Patient taking differently: Take 10 mg by mouth at bedtime.), Disp: 30 tablet, Rfl: 0 Allergies  Allergen Reactions   Penicillins Other (See Comments)    Caused convulsions as a child  Did it involve swelling of the face/tongue/throat, SOB, or low BP? N/A Did it involve sudden or severe rash/hives, skin peeling, or any reaction on the inside of your mouth or nose? N/A Did you need to seek medical attention at a hospital or doctor's office?N/A When did it last happen? N/A If all above answers are "NO", may proceed  with cephalosporin use.     Social History   Socioeconomic History   Marital status: Single    Spouse name: Not on file   Number of children: Not on file   Years of education: Not on file   Highest education level: Not on file  Occupational History   Not on file  Tobacco Use   Smoking status: Never   Smokeless tobacco: Never  Vaping Use   Vaping Use: Never used  Substance and Sexual Activity   Alcohol use: Not Currently   Drug use: Not Currently    Types: Marijuana    Comment: Smoked for 30 years   Sexual activity: Not on file  Other Topics Concern   Not on file  Social History Narrative   Not on file   Social Determinants of Health   Financial Resource Strain: Medium Risk (11/15/2020)   Overall Financial Resource Strain (CARDIA)    Difficulty of Paying Living Expenses: Somewhat hard  Food Insecurity: No Food Insecurity (03/08/2022)   Hunger Vital Sign    Worried About Running Out of Food in the Last Year: Never true    Ran Out of Food in the Last Year: Never true  Transportation Needs: Unmet Transportation Needs (03/08/2022)   PRAPARE - Hydrologist (Medical): Yes    Lack of Transportation (Non-Medical): Yes  Physical Activity: Not on file  Stress: Not on file  Social Connections: Not on file  Intimate Partner Violence: Not At Risk (03/08/2022)   Humiliation, Afraid, Rape, and Kick questionnaire    Fear of Current or Ex-Partner: No    Emotionally Abused: No    Physically Abused: No    Sexually Abused: No    Physical Exam      Future Appointments  Date Time Provider [place]  03/24/2022  9:00 AM [facility] PA/NP [facility] None  04/21/2022  3:45 PM [place], Nena Alexander, DPM [facility]     ACTION: Home visit completed

## 2022-03-18 DIAGNOSIS — F1120 Opioid dependence, uncomplicated: Secondary | ICD-10-CM | POA: Diagnosis not present

## 2022-03-18 DIAGNOSIS — F332 Major depressive disorder, recurrent severe without psychotic features: Secondary | ICD-10-CM | POA: Diagnosis not present

## 2022-03-18 DIAGNOSIS — F411 Generalized anxiety disorder: Secondary | ICD-10-CM | POA: Diagnosis not present

## 2022-03-19 NOTE — Discharge Summary (Signed)
Physician Discharge Summary  Charles Gray GMW:[id number] DOB: [birth year] DOA: 03/04/2022  PCP: Enid Skeens., MD  Admit date: 03/04/2022 Discharge date: 03/11/2022  Time spent: 65mnutes  Recommendations for Outpatient Follow-up:  CHF clinic Dr. MAundra Dubinin 2 to 3 weeks Home health services and heart failure para medicine follow-up   Discharge Diagnoses:  Principal Problem:   Acute on chronic heart failure with preserved ejection fraction (HFpEF) (HCC) Active Problems:   Paroxysmal atrial fibrillation (HCC)   COPD (chronic obstructive pulmonary disease) ([place]   Essential hypertension   Mixed hyperlipidemia   Anemia of chronic disease   Cirrhosis of liver without ascites (HCC)   OSA (obstructive sleep apnea)   Discharge Condition: Improved  Diet recommendation: Low-sodium, heart healthy  Filed Weights   03/09/22 0535 03/10/22 0411 03/11/22 0442  Weight: (!) 176.1 kg (!) 177 kg (!) 175.1 kg    History of present illness:  66/M with history of HFpEF (EF 60-65%), PAF on Eliquis, history of PE/DVT, COPD, cirrhosis, HTN, HLD, iron deficiency anemia, GAD, OSA nonadherent to CPAP who presented to the ED for evaluation of dyspnea and lower extremity edema x 10days, 15lb weight gain In ED, labs BNP of 60, creat 0.9, CXR w/ emphysema  Hospital Course:   Acute on chronic diastolic CHF 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  Also has a component of chronic venous insufficiency --Repeat echo with preserved EF, RV not well-visualized -Diuresed well on IV Lasix, he is 14 L negative, volume status has improved, followed by CHF team this admission, transitioned back to torsemide 100 Mg twice daily with metolazone 4 days a week, eplerenone -Reportedly intolerant to Jardiance(GI symptoms) -Discharged home in stable condition, home health services including RN set up, educated about diet, also has a significant  component of anxiety -Paramedicine to pick up his care at discharge   Gout flare -Improving treated with few doses of prednisone this admission   Paroxysmal atrial fibrillation ([facility] Sinus rhythm on admission. -Continue Toprol-XL and Eliquis   History of DVT/PE -Continue Eliquis   COPD (chronic obstructive pulmonary disease) ([place] -Continue as needed albuterol   Obesity OSA (obstructive sleep apnea) -Reportedly intolerant to CPAP   Cirrhosis of liver without ascites ([place] -Likely secondary to RV failure, diuretics as above -Continue  lactulose, Aldactone   Anemia of chronic disease Iron deficiency anemia -Iron stores are low, given IV Feraheme 11/24,  -Hemoglobin is stable   Mixed hyperlipidemia Continue pravastatin.   Anxiety -Continue sertraline   Chronic Suboxone use -Resumed   OSA -Reportedly cannot tolerate CPAP, counseled, counseled again  Discharge Exam: Vitals:   03/11/22 0442 03/11/22 0810  BP: (!) 123/58 124/62  Pulse: 63 69  Resp: 18 18  Temp: 97.7 F (36.5 C)   SpO2: 96%    General exam: Obese male sitting up in bed, AAOx3, no distress HEENT: Neck obese unable to assess JVD CVS: S1-S2, regular rhythm Lungs: Clear bilaterally Abdomen: Soft, obese, nontender, bowel sounds present Extremities: Chronic edema to his knees, Unna boots on, mild tenderness of left foot first MTP joint Psychiatry:  Mood & affect appropriate.   Discharge Instructions   Discharge Instructions     Diet - low sodium heart healthy   Complete by: As directed    Increase activity slowly   Complete by: As directed       Allergies as of 03/11/2022       Reactions   Penicillins Other (See Comments)  Caused convulsions as a child  Did it involve swelling of the face/tongue/throat, SOB, or low BP? N/A Did it involve sudden or severe rash/hives, skin peeling, or any reaction on the inside of your mouth or nose? N/A Did you need to seek medical attention at a  hospital or doctor's office?N/A When did it last happen? N/A If all above answers are "NO", may proceed with cephalosporin use.        Medication List     TAKE these medications    apixaban 5 MG Tabs tablet Commonly known as: Eliquis Take 1 tablet (5 mg total) by mouth 2 (two) times daily.   Buprenorphine HCl-Naloxone HCl 8-2 MG Film Place 1 Film under the tongue in the morning and at bedtime.   gabapentin 800 MG tablet Commonly known as: NEURONTIN Take 1 tablet (800 mg total) by mouth 3 (three) times daily.   lactulose (encephalopathy) 10 GM/15ML Soln Commonly known as: CHRONULAC Take 30 mLs (20 g total) by mouth 2 (two) times daily. What changed: when to take this   metolazone 2.5 MG tablet Commonly known as: ZAROXOLYN Take 1 tab, 4days a week on Monday, Wednesday  Fridays and Saturdays What changed:  how much to take how to take this when to take this additional instructions   metoprolol succinate 25 MG 24 hr tablet Commonly known as: Toprol XL Take 1 tablet (25 mg total) by mouth at bedtime.   ondansetron 4 MG disintegrating tablet Commonly known as: ZOFRAN-ODT Take 4 mg by mouth every 8 (eight) hours as needed for nausea or vomiting (dissolve orally).   pantoprazole 40 MG tablet Commonly known as: PROTONIX Take 40 mg by mouth 2 (two) times daily before a meal.   polyethylene glycol 17 g packet Commonly known as: MIRALAX / GLYCOLAX Take 17 g by mouth as needed for mild constipation (mix and drink as directed).   potassium chloride SA 20 MEQ tablet Commonly known as: KLOR-CON M Take 5 tablets (100 mEq total) by mouth 3 (three) times daily. Take one tablet extra (40 meq) with metolazone. What changed:  how much to take when to take this additional instructions   sertraline 50 MG tablet Commonly known as: ZOLOFT Take 1 tablet (50 mg total) by mouth daily.   tamsulosin 0.4 MG Caps capsule Commonly known as: FLOMAX Take 1 capsule (0.4 mg total) by  mouth daily. Additional refills will need to come from PCP What changed: additional instructions   torsemide 100 MG tablet Commonly known as: DEMADEX Take 1 tablet (100 mg total) by mouth twice daily. What changed: additional instructions   triamcinolone 0.025 % ointment Commonly known as: KENALOG Apply 1 application. topically 2 (two) times daily. What changed:  when to take this additional instructions   Ventolin HFA 108 (90 Base) MCG/ACT inhaler Generic drug: albuterol Inhale 1 puff into the lungs every 4 (four) hours as needed for shortness of breath. What changed:  how much to take reasons to take this   zolpidem 10 MG tablet Commonly known as: AMBIEN Take 1 tablet (10 mg total) by mouth at bedtime as needed for sleep. What changed: when to take this       Allergies  Allergen Reactions   Penicillins Other (See Comments)    Caused convulsions as a child  Did it involve swelling of the face/tongue/throat, SOB, or low BP? N/A Did it involve sudden or severe rash/hives, skin peeling, or any reaction on the inside of your mouth or nose? N/A Did  you need to seek medical attention at a hospital or doctor's office?N/A When did it last happen? N/A If all above answers are "NO", may proceed with cephalosporin use.    Follow-up Information     [facility] [place] Follow up.   Specialty: Home Health Services Why: Home Health RN and PT- agency will call with appt Contact information: [address] [place] [address] [place] .          [facility] Follow up on 03/24/2022.   Specialty: Cardiology Why: at  9:00 Contact information: [address] [id number] [place] [phone]                 The results of significant diagnostics from this hospitalization (including imaging, microbiology, ancillary and laboratory) are  listed below for reference.    Significant Diagnostic Studies: ECHOCARDIOGRAM COMPLETE  Result Date: 03/06/2022    ECHOCARDIOGRAM REPORT   Patient Name:   Charles Gray Date of Exam: 03/06/2022 Medical Rec #:  [id number]       Height:       82.0 in Accession #:    [id number]      Weight:       392.2 lb Date of Birth:  Feb 07, 1956       BSA:          3.117 m Patient Age:    35 years        BP:           136/77 mmHg Patient Gender: M               HR:           72 bpm. Exam Location:  Inpatient Procedure: 2D Echo, Cardiac Doppler, Color Doppler and Intracardiac            Opacification Agent Indications:    CHF-Acute Diastolic Y40.34  History:        Patient has prior history of Echocardiogram examinations, most                 recent 07/09/2021. CHF, Signs/Symptoms:Dyspnea, Chest Pain and                 Edema; Risk Factors:Sleep Apnea, Dyslipidemia, Hypertension and                 Non-Smoker.  Sonographer:    Greer Pickerel Referring Phys: [id number] VISHAL R PATEL  Sonographer Comments: Technically difficult study due to poor echo windows, suboptimal parasternal window, suboptimal apical window, suboptimal subcostal window and patient is obese. Image acquisition challenging due to respiratory motion. IMPRESSIONS  1. Left ventricular ejection fraction, by estimation, is 55 to 60%. The left ventricle has normal function. The left ventricle has no regional wall motion abnormalities. The left ventricular internal cavity size was not well visualized. Left ventricular  diastolic parameters are indeterminate.  2. Right ventricular systolic function was not well visualized. The right ventricular size is not well visualized. There is normal pulmonary artery systolic pressure.  3. The mitral valve was not well visualized. No evidence of mitral valve regurgitation.  4. The aortic valve was not well visualized. Aortic valve regurgitation is not visualized. No aortic stenosis is present.  5. Aortic dilatation noted. There is  mild dilatation of the aortic root, measuring 40 mm.  6. Technically difficult study. FINDINGS  Left Ventricle: Left  ventricular ejection fraction, by estimation, is 55 to 60%. The left ventricle has normal function. The left ventricle has no regional wall motion abnormalities. Definity contrast agent was given IV to delineate the left ventricular  endocardial borders. The left ventricular internal cavity size was not well visualized. Suboptimal image quality limits for assessment of left ventricular hypertrophy. Left ventricular diastolic parameters are indeterminate. Right Ventricle: The right ventricular size is not well visualized. Right vetricular wall thickness was not well visualized. Right ventricular systolic function was not well visualized. There is normal pulmonary artery systolic pressure. The tricuspid regurgitant velocity is 1.22 m/s, and with an assumed right atrial pressure of 8 mmHg, the estimated right ventricular systolic pressure is 94.4 mmHg. Left Atrium: Left atrial size was not well visualized. Right Atrium: Right atrial size was not well visualized. Pericardium: There is no evidence of pericardial effusion. Mitral Valve: The mitral valve was not well visualized. No evidence of mitral valve regurgitation. Tricuspid Valve: The tricuspid valve is not well visualized. Tricuspid valve regurgitation is not demonstrated. Aortic Valve: The aortic valve was not well visualized. Aortic valve regurgitation is not visualized. No aortic stenosis is present. Pulmonic Valve: The pulmonic valve was not well visualized. Pulmonic valve regurgitation is not visualized. Aorta: Aortic dilatation noted. There is mild dilatation of the aortic root, measuring 40 mm. IAS/Shunts: The interatrial septum was not well visualized.  LEFT VENTRICLE PLAX 2D LVOT diam:     2.30 cm      Diastology LV SV:         67           LV e' medial:    14.00 cm/s LV SV Index:   21           LV E/e' medial:  6.7 LVOT Area:     4.15 cm      LV e' lateral:   12.50 cm/s                             LV E/e' lateral: 7.5  LV Volumes (MOD) LV vol d, MOD A2C: 161.0 ml LV vol d, MOD A4C: 193.0 ml LV vol s, MOD A2C: 114.0 ml LV vol s, MOD A4C: 83.6 ml LV SV MOD A2C:     47.0 ml LV SV MOD A4C:     193.0 ml LV SV MOD BP:      81.1 ml RIGHT VENTRICLE RV S prime:     14.50 cm/s LEFT ATRIUM             Index LA Vol (A2C):   71.6 ml 22.97 ml/m LA Vol (A4C):   79.9 ml 25.63 ml/m LA Biplane Vol: 84.7 ml 27.17 ml/m  AORTIC VALVE LVOT Vmax:   101.00 cm/s LVOT Vmean:  64.000 cm/s LVOT VTI:    0.161 m  AORTA Ao Root diam: 4.00 cm MITRAL VALVE               TRICUSPID VALVE MV Area (PHT): 3.30 cm    TR Peak grad:   6.0 mmHg MV Decel Time: 230 msec    TR Vmax:        122.00 cm/s MV E velocity: 94.30 cm/s MV A velocity: 80.60 cm/s  SHUNTS MV E/A ratio:  1.17        Systemic VTI:  0.16 m  Systemic Diam: 2.30 cm Rudean Haskell MD Electronically signed by Rudean Haskell MD Signature Date/Time: 03/06/2022/2:17:46 PM    Final    [place] ASCITES (ABDOMEN LIMITED)  Result Date: 03/06/2022 CLINICAL DATA:  Ascites EXAM: LIMITED ABDOMEN ULTRASOUND FOR ASCITES TECHNIQUE: Limited ultrasound survey for ascites was performed in all four abdominal quadrants. COMPARISON:  None Available. FINDINGS: No evidence of ascites. IMPRESSION: No evidence of ascites. Electronically Signed   By: Maurine Simmering M.D.   On: 03/06/2022 10:53   DG Chest 2 View  Result Date: 03/04/2022 CLINICAL DATA:  Generalized weakness and malaise for 3 days. History of congestive heart failure and COPD. Chest pain. EXAM: CHEST - 2 VIEW COMPARISON:  07/07/2021 FINDINGS: Heart size and pulmonary vascularity are normal. Surgical clip over the left hilum. Emphysematous changes in the lungs. No airspace disease or consolidation. No pleural effusions. No pneumothorax. Mediastinal contours appear intact. IMPRESSION: Emphysematous changes in the lungs. No evidence of active pulmonary  disease. Electronically Signed   By: Lucienne Capers M.D.   On: 03/04/2022 16:12    Microbiology: No results found for this or any previous visit (from the past 240 hour(s)).   Labs: Basic Metabolic Panel: No results for input(s): "NA", "K", "CL", "CO2", "GLUCOSE", "BUN", "CREATININE", "CALCIUM", "MG", "PHOS" in the last 168 hours. Liver Function Tests: No results for input(s): "AST", "ALT", "ALKPHOS", "BILITOT", "PROT", "ALBUMIN" in the last 168 hours. No results for input(s): "LIPASE", "AMYLASE" in the last 168 hours. No results for input(s): "AMMONIA" in the last 168 hours. CBC: No results for input(s): "WBC", "NEUTROABS", "HGB", "HCT", "MCV", "PLT" in the last 168 hours. Cardiac Enzymes: No results for input(s): "CKTOTAL", "CKMB", "CKMBINDEX", "TROPONINI" in the last 168 hours. BNP: BNP (last 3 results) Recent Labs    11/21/21 1025 12/10/21 1414 03/04/22 1516  BNP 24.3 35.9 60.9    ProBNP (last 3 results) No results for input(s): "PROBNP" in the last 8760 hours.  CBG: No results for input(s): "GLUCAP" in the last 168 hours.     Signed:  Domenic Polite MD.  [facility] 03/19/2022, 1:36 PM

## 2022-03-20 ENCOUNTER — Other Ambulatory Visit (HOSPITAL_COMMUNITY): Payer: Self-pay

## 2022-03-20 MED ORDER — TORSEMIDE 100 MG PO TABS: 100.0000 mg | ORAL_TABLET | ORAL | 1 refills | Status: DC

## 2022-03-23 ENCOUNTER — Telehealth (HOSPITAL_COMMUNITY): Payer: Self-pay

## 2022-03-23 NOTE — Telephone Encounter (Signed)
Received a call from Mr. Martos aide who reports he is covid positive and has been since Friday 03/20/22 after having symptoms of coughing and congestion with fatigue. Home tests X5 times all positive. He reported same to his PCP Dr. Wendie Agreste and they placed him on the Paxlovid prescription.   He has a AHF clinic visit tomorrow and same will need to be rescheduled. I will reach out to the HF clinic scheduler and adjust same and let Eliezer Lofts know to cancel his ride with [facility] tomorrow as well.   I will follow up in the home tomorrow.   Call complete.   [name], [place] 03/23/2022

## 2022-03-24 ENCOUNTER — Encounter (HOSPITAL_COMMUNITY): Payer: Medicare HMO

## 2022-03-24 ENCOUNTER — Telehealth (HOSPITAL_COMMUNITY): Payer: Self-pay | Admitting: Cardiology

## 2022-03-24 ENCOUNTER — Other Ambulatory Visit (HOSPITAL_COMMUNITY): Payer: Self-pay

## 2022-03-24 NOTE — Telephone Encounter (Signed)
Heather with paramedicine called during pts home visit to report increased breast tenderness and hives at upper chest with continued use of inspra   Reports the tenderness has increased however the hives/ redness is new the past week or so. *note pt given paxlovid within the past week*   Advised to hold until further instructions are given per providers

## 2022-03-24 NOTE — Telephone Encounter (Signed)
Would not change eplerenone for now, he has tolerated for a long time.  Hives may be due to something else (?Paxlovid).

## 2022-03-24 NOTE — Progress Notes (Signed)
Paramedicine Encounter    Patient ID: Charles Gray, male    DOB: October 20, 1955, 66 y.o.   MRN: [id number]   Arrived for home visit for Charles Gray who was seated in his recliner. He donned a mask due to him testing positive for covid last week on Friday. He is still symptomatic- has 4 days left of Paxlovid. He endorses a productive cough as well as a runny nose and general fatigue, headaches and body aches. He denied fever. I obtained vitals as noted:  WT- 383lbs (up 3 lbs from yesterday- he reports eating stuffed peppers and drinking more fluids due to illness)  BP- 130/72 HR- 80 RR- 16 O2- 98%  Lungs- some mild wheezing with diminished bases.   Lower legs wrapped by RN from Charles Gray yesterday.   He has been compliant with meds over the last week. I reviewed meds and confirmed same. He reports some itching and hives around his breasts. He says he believes it is the Inspra- which we brought to the attention of Dr. Aundra Gray prior to Charles Gray admission recently but was continued. I called the HF clinic to report same and they advised to hold it until MD makes a decision. I filled pill box accordingly for one week. Refills called into Charles Gray as noted- -Tamsulosin   I reviewed updated appointments with Charles Gray and wrote down same.   He asked if I could assist with re-testing for Covid using at home test. We did so and it is negative today as of 0956.   We reviewed HF management- diet, med compliance, fluid restrictions.   Home visit complete. I will see Charles Gray in one week. He knows to reach out if needed. I will follow up on Inspra changes.  (Same was left out of pill box until we hear further)  Charles Gray, [place] 03/24/2022     Patient Care Team: Enid Skeens., MD as PCP - General (Family Medicine) Larey Dresser, MD as PCP - Cardiology (Cardiology) Charles Ny, LCSW as Social Worker (Licensed Clinical Social Worker)  Patient Active Problem List    Diagnosis Date Noted   Acute on chronic heart failure with preserved ejection fraction (HFpEF) ([place]) 03/04/2022   Dyslipidemia 07/17/2021   Lymphedema 07/08/2021   Melena    Gastric polyps    UGIB (upper gastrointestinal bleed) 04/06/2021   Acute encephalopathy 12/21/2020   COPD (chronic obstructive pulmonary disease) ([place]) 12/21/2020   Chronic diastolic (congestive) heart failure ([place]) 11/14/2020   Syncope 10/28/2020   CHF (congestive heart failure) ([place]) 10/28/2020   Chronic venous stasis dermatitis of both lower extremities 08/27/2020   Cellulitis of both lower extremities 08/14/2020   Iron deficiency anemia 08/14/2020   GERD without esophagitis 06/10/2020   Atrial fibrillation with rapid ventricular response ([place]) 03/24/2020   Acute on chronic right heart failure ([place]) 01/27/2020   Acute respiratory failure with hypoxia (HCC)    Fatty liver disease, nonalcoholic    Shock ([place])    Personal history of DVT (deep vein thrombosis) 12/26/2019   Chronic anticoagulation 12/26/2019   Palpitations 12/26/2019   Paroxysmal atrial fibrillation ([name]) 12/23/2019   Heart failure, systolic, acute ([place]) 87/56/4332   Acute on chronic congestive heart failure (HCC)    Acute on chronic diastolic CHF (congestive heart failure) ([place]) 08/23/2019   Chest pain 08/23/2019   Lactic acidosis 08/23/2019   History of pulmonary embolism 08/12/2019   Atrial tachycardia 08/12/2019   Occasional tremors 08/12/2019   OSA (obstructive sleep apnea) 08/12/2019   AKI (acute  kidney injury) ([place]) 08/08/2019   Acute on chronic right-sided heart failure ([place]) 08/06/2019   Acute pulmonary embolism ([place]) 06/08/2019   Normocytic anemia 06/08/2019   Pulmonary emboli ([place]) 06/08/2019   Chronic heart failure with preserved ejection fraction ([place]) 05/30/2019   Class 2 severe obesity due to excess calories with serious comorbidity and body mass index (BMI) of 39.0 to 39.9 in adult [facility]) 05/30/2019   Morbid obesity with BMI  of 40.0-44.9, adult ([place]) 05/30/2019   Hyponatremia    Drug induced constipation    Peripheral edema    Hypotension due to drugs    Hypoalbuminemia due to protein-calorie malnutrition (HCC)    Thrombocytopenia (HCC)    Anemia of chronic disease    Cirrhosis of liver without ascites (HCC)    Chronic pain syndrome    Debility 03/13/2019   Portal hypertensive gastropathy ([place]) 03/06/2019   Dyspnea    GIB (gastrointestinal bleeding) 03/04/2019   Mixed hyperlipidemia 01/24/2019   Insomnia 01/24/2019   Hyperlipidemia 01/24/2019   Essential hypertension 01/24/2019   Lumbar disc herniation 01/24/2019    Current Outpatient Medications:    apixaban (ELIQUIS) 5 MG TABS tablet, Take 1 tablet (5 mg total) by mouth 2 (two) times daily., Disp: 60 tablet, Rfl: 3   Buprenorphine HCl-Naloxone HCl 8-2 MG FILM, Place 1 Film under the tongue in the morning and at bedtime., Disp: , Rfl:    eplerenone (INSPRA) 50 MG tablet, Take 2 tablets (100 mg total) by mouth daily., Disp: 60 tablet, Rfl: 6   gabapentin (NEURONTIN) 800 MG tablet, Take 1 tablet (800 mg total) by mouth 3 (three) times daily., Disp: 90 tablet, Rfl: 1   lactulose, encephalopathy, (CHRONULAC) 10 GM/15ML SOLN, Take 30 mLs (20 g total) by mouth 2 (two) times daily., Disp: , Rfl:    metolazone (ZAROXOLYN) 2.5 MG tablet, Take 1 tab, 4days a week on Monday, Wednesday  Fridays and Saturdays, Disp: 30 tablet, Rfl: 0   metoprolol succinate (TOPROL XL) 25 MG 24 hr tablet, Take 1 tablet (25 mg total) by mouth at bedtime., Disp: 30 tablet, Rfl: 11   ondansetron (ZOFRAN-ODT) 4 MG disintegrating tablet, Take 4 mg by mouth every 8 (eight) hours as needed for nausea or vomiting (dissolve orally)., Disp: , Rfl:    pantoprazole (PROTONIX) 40 MG tablet, Take 40 mg by mouth 2 (two) times daily before a meal., Disp: , Rfl:    polyethylene glycol (MIRALAX / GLYCOLAX) 17 g packet, Take 17 g by mouth as needed for mild constipation (mix and drink as directed).,  Disp: , Rfl:    potassium chloride SA (KLOR-CON M) 20 MEQ tablet, Take 5 tablets (100 mEq total) by mouth 3 (three) times daily. Take one tablet extra (40 meq) with metolazone. (Patient taking differently: Take 40-120 mEq by mouth See admin instructions. Take 120 mEq by mouth in the morning, 100 mEq at noontime, and 100 mEq in the evening. Take an additional 40 mEq by mouth on Mon/Wed/Fri when taking Metolazone.), Disp: 463 tablet, Rfl: 6   pravastatin (PRAVACHOL) 20 MG tablet, Take 1 tablet (20 mg total) by mouth daily., Disp: 30 tablet, Rfl: 3   sertraline (ZOLOFT) 50 MG tablet, Take 1 tablet (50 mg total) by mouth daily., Disp: 30 tablet, Rfl: 3   tamsulosin (FLOMAX) 0.4 MG CAPS capsule, Take 1 capsule (0.4 mg total) by mouth daily. Additional refills will need to come from PCP (Patient taking differently: Take 0.4 mg by mouth daily. Takes in the evening.), Disp: 108  capsule, Rfl: 0   torsemide (DEMADEX) 100 MG tablet, Take 1 tablet (100 mg total) by mouth See admin instructions. Take 100 mg by mouth in the morning and evening, Disp: 90 tablet, Rfl: 1   triamcinolone (KENALOG) 0.025 % ointment, Apply 1 application. topically 2 (two) times daily. (Patient taking differently: Apply 1 application  topically See admin instructions. Apply to both lower legs/feet 2 times a day), Disp: 30 g, Rfl: 0   VENTOLIN HFA 108 (90 Base) MCG/ACT inhaler, Inhale 1 puff into the lungs every 4 (four) hours as needed for shortness of breath. (Patient taking differently: Inhale 2 puffs into the lungs every 4 (four) hours as needed for shortness of breath or wheezing.), Disp: 6.7 g, Rfl: 1   zolpidem (AMBIEN) 10 MG tablet, Take 1 tablet (10 mg total) by mouth at bedtime as needed for sleep. (Patient taking differently: Take 10 mg by mouth at bedtime.), Disp: 30 tablet, Rfl: 0 Allergies  Allergen Reactions   Penicillins Other (See Comments)    Caused convulsions as a child  Did it involve swelling of the face/tongue/throat,  SOB, or low BP? N/A Did it involve sudden or severe rash/hives, skin peeling, or any reaction on the inside of your mouth or nose? N/A Did you need to seek medical attention at a Gray or doctor's office?N/A When did it last happen? N/A If all above answers are "NO", may proceed with cephalosporin use.     Social History   Socioeconomic History   Marital status: Single    Spouse name: Not on file   Number of children: Not on file   Years of education: Not on file   Highest education level: Not on file  Occupational History   Not on file  Tobacco Use   Smoking status: Never   Smokeless tobacco: Never  Vaping Use   Vaping Use: Never used  Substance and Sexual Activity   Alcohol use: Not Currently   Drug use: Not Currently    Types: Marijuana    Comment: Smoked for 30 years   Sexual activity: Not on file  Other Topics Concern   Not on file  Social History Narrative   Not on file   Social Determinants of Health   Financial Resource Strain: Medium Risk (11/15/2020)   Overall Financial Resource Strain (CARDIA)    Difficulty of Paying Living Expenses: Somewhat hard  Food Insecurity: No Food Insecurity (03/08/2022)   Hunger Vital Sign    Worried About Running Out of Food in the Last Year: Never true    Ran Out of Food in the Last Year: Never true  Transportation Needs: Unmet Transportation Needs (03/08/2022)   PRAPARE - Hydrologist (Medical): Yes    Lack of Transportation (Non-Medical): Yes  Physical Activity: Not on file  Stress: Not on file  Social Connections: Not on file  Intimate Partner Violence: Not At Risk (03/08/2022)   Humiliation, Afraid, Rape, and Kick questionnaire    Fear of Current or Ex-Partner: No    Emotionally Abused: No    Physically Abused: No    Sexually Abused: No    Physical Exam      Future Appointments  Date Time Provider [place]  04/15/2022 10:30 AM [facility] PA/NP [facility] None  04/21/2022  3:45 PM  Standiford, Nena Alexander, DPM [facility]     ACTION: Home visit completed

## 2022-03-24 NOTE — Telephone Encounter (Signed)
Message to [facility] with para medicine

## 2022-03-31 ENCOUNTER — Other Ambulatory Visit (HOSPITAL_COMMUNITY): Payer: Self-pay

## 2022-03-31 DIAGNOSIS — F332 Major depressive disorder, recurrent severe without psychotic features: Secondary | ICD-10-CM | POA: Diagnosis not present

## 2022-03-31 DIAGNOSIS — F411 Generalized anxiety disorder: Secondary | ICD-10-CM | POA: Diagnosis not present

## 2022-03-31 DIAGNOSIS — F1120 Opioid dependence, uncomplicated: Secondary | ICD-10-CM | POA: Diagnosis not present

## 2022-03-31 NOTE — Progress Notes (Signed)
Paramedicine Encounter    Patient ID: Charles Gray, male    DOB: [birth year], 66 y.o.   MRN: [id number]  Arrived for home visit for [name] where he was seated in his recliner. He reports that he is feeling okay but having some overall pain, aches and fatigue. He reports that he had a fall last night tripping over his walker and falling into the wall frame resulting in no injury and him being able to get up on his own and ambulate to his recliner after the fall. He did not call EMS for evaluation.   Today he reports a 7 lb weight gain over night- he reports eating fruit as snacks however I see t[employer] take out, beef jerky, ice cream, cheez its, italian ice and coke sodas in the trash can. He reports prior to my arrival when taking his morning meds he took an extra metolazone with 40 mEq Potassium.  Vitals obtained as noted: WT- 390lbs (yesterday 383lbs) BP- 120/78 HR- 88 O2- 96% RR- 18 Lungs- clear Legs- wrapped by [facility] RN yesterday  I reviewed meds and confirmed same  filling pill boxes for two weeks. Refills as noted: -Tamsulosin -Gabapentin -Potassium  -Lactulose   HF education provided and discussed. I reminded him the importance of med compliance and diet and fluid restrictions. He verbalized understanding.   Concerned as Mems is reading below goal however he is gaining weight overnight but Mems is not reflecting same. I will discuss with NP at HF meeting tomorrow.   I will follow up in two weeks. [name] aware and agreeable. Home visit complete.   [name], [place] 03/31/2022        Patient Care Team: Enid Skeens., MD as PCP - General (Family Medicine) Larey Dresser, MD as PCP - Cardiology (Cardiology) Jorge Ny, LCSW as Social Worker (Licensed Clinical Social Worker)     Patient Active Problem List   Diagnosis Date Noted   Acute on chronic heart failure with preserved ejection fraction (HFpEF) ([place]) 03/04/2022   Dyslipidemia  07/17/2021   Lymphedema 07/08/2021   Melena    Gastric polyps    UGIB (upper gastrointestinal bleed) 04/06/2021   Acute encephalopathy 12/21/2020   COPD (chronic obstructive pulmonary disease) ([place]) 12/21/2020   Chronic diastolic (congestive) heart failure ([place]) 11/14/2020   Syncope 10/28/2020   CHF (congestive heart failure) ([place]) 10/28/2020   Chronic venous stasis dermatitis of both lower extremities 08/27/2020   Cellulitis of both lower extremities 08/14/2020   Iron deficiency anemia 08/14/2020   GERD without esophagitis 06/10/2020   Atrial fibrillation with rapid ventricular response ([place]) 03/24/2020   Acute on chronic right heart failure ([place]) 01/27/2020   Acute respiratory failure with hypoxia (HCC)    Fatty liver disease, nonalcoholic    Shock ([place])    Personal history of DVT (deep vein thrombosis) 12/26/2019   Chronic anticoagulation 12/26/2019   Palpitations 12/26/2019   Paroxysmal atrial fibrillation ([place]) 12/23/2019   Heart failure, systolic, acute ([place]) 50/06/7046   Acute on chronic congestive heart failure (HCC)    Acute on chronic diastolic CHF (congestive heart failure) ([place]) 08/23/2019   Chest pain 08/23/2019   Lactic acidosis 08/23/2019   History of pulmonary embolism 08/12/2019   Atrial tachycardia 08/12/2019   Occasional tremors 08/12/2019   OSA (obstructive sleep apnea) 08/12/2019   AKI (acute kidney injury) ([place]) 08/08/2019   Acute on chronic right-sided heart failure ([place]) 08/06/2019   Acute pulmonary embolism ([place]) 06/08/2019   Normocytic anemia 06/08/2019  Pulmonary emboli (HCC) 06/08/2019   Chronic heart failure with preserved ejection fraction ([place]) 05/30/2019   Class 2 severe obesity due to excess calories with serious comorbidity and body mass index (BMI) of 39.0 to 39.9 in adult [facility]) 05/30/2019   Morbid obesity with BMI of 40.0-44.9, adult ([place]) 05/30/2019   Hyponatremia    Drug induced constipation    Peripheral edema    Hypotension due to  drugs    Hypoalbuminemia due to protein-calorie malnutrition (HCC)    Thrombocytopenia (HCC)    Anemia of chronic disease    Cirrhosis of liver without ascites (HCC)    Chronic pain syndrome    Debility 03/13/2019   Portal hypertensive gastropathy ([place]) 03/06/2019   Dyspnea    GIB (gastrointestinal bleeding) 03/04/2019   Mixed hyperlipidemia 01/24/2019   Insomnia 01/24/2019   Hyperlipidemia 01/24/2019   Essential hypertension 01/24/2019   Lumbar disc herniation 01/24/2019    Current Outpatient Medications:    apixaban (ELIQUIS) 5 MG TABS tablet, Take 1 tablet (5 mg total) by mouth 2 (two) times daily., Disp: 60 tablet, Rfl: 3   Buprenorphine HCl-Naloxone HCl 8-2 MG FILM, Place 1 Film under the tongue in the morning and at bedtime., Disp: , Rfl:    eplerenone (INSPRA) 50 MG tablet, Take 2 tablets (100 mg total) by mouth daily., Disp: 60 tablet, Rfl: 6   gabapentin (NEURONTIN) 800 MG tablet, Take 1 tablet (800 mg total) by mouth 3 (three) times daily., Disp: 90 tablet, Rfl: 1   lactulose, encephalopathy, (CHRONULAC) 10 GM/15ML SOLN, Take 30 mLs (20 g total) by mouth 2 (two) times daily., Disp: , Rfl:    metolazone (ZAROXOLYN) 2.5 MG tablet, Take 1 tab, 4days a week on Monday, Wednesday  Fridays and Saturdays, Disp: 30 tablet, Rfl: 0   metoprolol succinate (TOPROL XL) 25 MG 24 hr tablet, Take 1 tablet (25 mg total) by mouth at bedtime., Disp: 30 tablet, Rfl: 11   ondansetron (ZOFRAN-ODT) 4 MG disintegrating tablet, Take 4 mg by mouth every 8 (eight) hours as needed for nausea or vomiting (dissolve orally)., Disp: , Rfl:    pantoprazole (PROTONIX) 40 MG tablet, Take 40 mg by mouth 2 (two) times daily before a meal., Disp: , Rfl:    polyethylene glycol (MIRALAX / GLYCOLAX) 17 g packet, Take 17 g by mouth as needed for mild constipation (mix and drink as directed)., Disp: , Rfl:    potassium chloride SA (KLOR-CON M) 20 MEQ tablet, Take 5 tablets (100 mEq total) by mouth 3 (three) times daily.  Take one tablet extra (40 meq) with metolazone. (Patient taking differently: Take 40-120 mEq by mouth See admin instructions. Take 120 mEq by mouth in the morning, 100 mEq at noontime, and 100 mEq in the evening. Take an additional 40 mEq by mouth on Mon/Wed/Fri when taking Metolazone.), Disp: 463 tablet, Rfl: 6   pravastatin (PRAVACHOL) 20 MG tablet, Take 1 tablet (20 mg total) by mouth daily., Disp: 30 tablet, Rfl: 3   sertraline (ZOLOFT) 50 MG tablet, Take 1 tablet (50 mg total) by mouth daily., Disp: 30 tablet, Rfl: 3   tamsulosin (FLOMAX) 0.4 MG CAPS capsule, Take 1 capsule (0.4 mg total) by mouth daily. Additional refills will need to come from PCP (Patient taking differently: Take 0.4 mg by mouth daily. Takes in the evening.), Disp: 30 capsule, Rfl: 0   torsemide (DEMADEX) 100 MG tablet, Take 1 tablet (100 mg total) by mouth See admin instructions. Take 100 mg by mouth in the  morning and evening, Disp: 90 tablet, Rfl: 1   triamcinolone (KENALOG) 0.025 % ointment, Apply 1 application. topically 2 (two) times daily. (Patient taking differently: Apply 1 application  topically See admin instructions. Apply to both lower legs/feet 2 times a day), Disp: 30 g, Rfl: 0   VENTOLIN HFA 108 (90 Base) MCG/ACT inhaler, Inhale 1 puff into the lungs every 4 (four) hours as needed for shortness of breath. (Patient taking differently: Inhale 2 puffs into the lungs every 4 (four) hours as needed for shortness of breath or wheezing.), Disp: 6.7 g, Rfl: 1   zolpidem (AMBIEN) 10 MG tablet, Take 1 tablet (10 mg total) by mouth at bedtime as needed for sleep. (Patient taking differently: Take 10 mg by mouth at bedtime.), Disp: 30 tablet, Rfl: 0 Allergies  Allergen Reactions   Penicillins Other (See Comments)    Caused convulsions as a child  Did it involve swelling of the face/tongue/throat, SOB, or low BP? N/A Did it involve sudden or severe rash/hives, skin peeling, or any reaction on the inside of your mouth or nose?  N/A Did you need to seek medical attention at a hospital or doctor's office?N/A When did it last happen? N/A If all above answers are "NO", may proceed with cephalosporin use.     Social History   Socioeconomic History   Marital status: Single    Spouse name: Not on file   Number of children: Not on file   Years of education: Not on file   Highest education level: Not on file  Occupational History   Not on file  Tobacco Use   Smoking status: Never   Smokeless tobacco: Never  Vaping Use   Vaping Use: Never used  Substance and Sexual Activity   Alcohol use: Not Currently   Drug use: Not Currently    Types: Marijuana    Comment: Smoked for 30 years   Sexual activity: Not on file  Other Topics Concern   Not on file  Social History Narrative   Not on file   Social Determinants of Health   Financial Resource Strain: Medium Risk (11/15/2020)   Overall Financial Resource Strain (CARDIA)    Difficulty of Paying Living Expenses: Somewhat hard  Food Insecurity: No Food Insecurity (03/08/2022)   Hunger Vital Sign    Worried About Running Out of Food in the Last Year: Never true    Ran Out of Food in the Last Year: Never true  Transportation Needs: Unmet Transportation Needs (03/08/2022)   PRAPARE - Hydrologist (Medical): Yes    Lack of Transportation (Non-Medical): Yes  Physical Activity: Not on file  Stress: Not on file  Social Connections: Not on file  Intimate Partner Violence: Not At Risk (03/08/2022)   Humiliation, Afraid, Rape, and Kick questionnaire    Fear of Current or Ex-Partner: No    Emotionally Abused: No    Physically Abused: No    Sexually Abused: No    Physical Exam      Future Appointments  Date Time Provider [place]  04/15/2022 10:30 AM [facility] PA/NP [facility] None  04/21/2022  3:45 PM Standiford, Nena Alexander, DPM [facility]     ACTION: Home visit completed

## 2022-04-01 ENCOUNTER — Telehealth (HOSPITAL_COMMUNITY): Payer: Self-pay

## 2022-04-01 ENCOUNTER — Other Ambulatory Visit (HOSPITAL_COMMUNITY): Payer: Self-pay | Admitting: *Deleted

## 2022-04-01 MED ORDER — TAMSULOSIN HCL 0.4 MG PO CAPS: 0.4000 mg | ORAL_CAPSULE | Freq: Every day | ORAL | 0 refills | Status: AC

## 2022-04-01 NOTE — Telephone Encounter (Signed)
-----   Message from [name], EMT-P sent at 04/01/2022  5:54 PM EST ----- Regarding: refills Please send Flomax refills to [facility] for [facility].   Thanks.   [name], [place] 04/01/2022

## 2022-04-09 ENCOUNTER — Telehealth: Payer: Self-pay

## 2022-04-09 NOTE — Telephone Encounter (Signed)
04/09/2022  Charles Gray DOB: [birth year] MRN: [id number]   RIDER WAIVER AND RELEASE OF LIABILITY  For the purposes of helping with transportation needs, [facility] partners with outside transportation providers (taxi companies, [place], Social research officer, government.) to give [employer] patients or other approved people the choice of on-demand rides [employer]") to our buildings for non-emergency visits.  By using [employer], I, the person signing this document, on behalf of myself and/or any legal minors (in my care using the [employer]), agree:  Government social research officer given to me are supplied by independent, outside transportation providers who do not work for, or have any affiliation with, [employer]. [place] is not a transportation company. [place] has no control over the quality or safety of the rides I get using [employer]. [place] has no control over whether any outside ride will happen on time or not. [place] [facility] gives no guarantee on the reliability, quality, safety, or availability on any rides, or that no mistakes will happen. I know and accept that traveling by vehicle (car, truck, SVU, Lucianne Lei, bus, taxi, etc.) has risks of serious injuries such as disability, being paralyzed, and death. I know and agree the risk of using [employer] is mine alone, and not [employer]. Transport Services are provided "as is" and as are available. The transportation providers are in charge for all inspections and care of the vehicles used to provide these rides. I agree not to take legal action against [place], its agents, employees, officers, directors, representatives, insurers, attorneys, assigns, successors, subsidiaries, and affiliates at any time for any reasons related directly or indirectly to using [employer]. I also agree not to take legal action against  or its affiliates for any injury, death, or damage to property caused by or related to using  [employer]. I have read this Waiver and Release of Liability, and I understand the terms used in it and their legal meaning. This Waiver is freely and voluntarily given with the understanding that my right (or any legal minors) to legal action against [place] relating to [employer] is knowingly given up to use these services.   I attest that I read the Ride Waiver and Release of Liability to Charles Gray, gave Charles Gray the opportunity to ask questions and answered the questions asked (if any). I affirm that Charles Gray then provided consent for assistance with transportation.     Deamber Buckhalter D Shatoria [place]                                Telephone encounter was:  Successful.  04/09/2022 Name: Charles Gray MRN: [id number] DOB: [birth year]  Charles Gray is a 66 y.o. year old male who is a primary care patient of Slatosky, Marshall Cork., MD . The community resource team was consulted for assistance with Transportation Needs   Care guide performed the following interventions: Received email confirmation from [place] pickup time for 04/15/22 is 9:35am with [facility]. Patient has received a confirmation text from [place].   Follow Up Plan:  No further follow up planned at this time. The patient has been provided with needed resources.  [place] Resource Care Guide   [email or web address]  ?? [phone]   Website: [email or web address]

## 2022-04-09 NOTE — Telephone Encounter (Signed)
   Telephone encounter was:  Successful.  04/09/2022 Name: Charles Gray MRN: [id number] DOB: 09/05/1955  Charles Gray is a 66 y.o. year old male who is a primary care patient of Slatosky, Marshall Cork., MD . The community resource team was consulted for assistance with Transportation Needs   Care guide performed the following interventions: Received email confirmation from [place] ride will be provided by [place] rather than [place] transit.  Called patient to inform him of the change and gave him the number to call if he needs to cancel [phone].  Follow Up Plan:  No further follow up planned at this time. The patient has been provided with needed resources.  [place] Resource Care Guide   [email or web address]  ?? [phone]   Website: [email or web address]  [email or web address]

## 2022-04-11 DIAGNOSIS — M5126 Other intervertebral disc displacement, lumbar region: Secondary | ICD-10-CM | POA: Diagnosis not present

## 2022-04-11 DIAGNOSIS — I872 Venous insufficiency (chronic) (peripheral): Secondary | ICD-10-CM | POA: Diagnosis not present

## 2022-04-11 DIAGNOSIS — I480 Paroxysmal atrial fibrillation: Secondary | ICD-10-CM | POA: Diagnosis not present

## 2022-04-11 DIAGNOSIS — J449 Chronic obstructive pulmonary disease, unspecified: Secondary | ICD-10-CM | POA: Diagnosis not present

## 2022-04-11 DIAGNOSIS — I110 Hypertensive heart disease with heart failure: Secondary | ICD-10-CM | POA: Diagnosis not present

## 2022-04-11 DIAGNOSIS — D509 Iron deficiency anemia, unspecified: Secondary | ICD-10-CM | POA: Diagnosis not present

## 2022-04-11 DIAGNOSIS — I890 Lymphedema, not elsewhere classified: Secondary | ICD-10-CM | POA: Diagnosis not present

## 2022-04-11 DIAGNOSIS — G894 Chronic pain syndrome: Secondary | ICD-10-CM | POA: Diagnosis not present

## 2022-04-11 DIAGNOSIS — I5032 Chronic diastolic (congestive) heart failure: Secondary | ICD-10-CM | POA: Diagnosis not present

## 2022-04-14 DIAGNOSIS — F1120 Opioid dependence, uncomplicated: Secondary | ICD-10-CM | POA: Diagnosis not present

## 2022-04-14 DIAGNOSIS — F332 Major depressive disorder, recurrent severe without psychotic features: Secondary | ICD-10-CM | POA: Diagnosis not present

## 2022-04-14 DIAGNOSIS — F411 Generalized anxiety disorder: Secondary | ICD-10-CM | POA: Diagnosis not present

## 2022-04-14 DIAGNOSIS — Z6841 Body Mass Index (BMI) 40.0 and over, adult: Secondary | ICD-10-CM | POA: Diagnosis not present

## 2022-04-14 NOTE — Progress Notes (Signed)
ADVANCED HF CLINIC NOTE  PCP: Enid Skeens., MD Primary Cardiologist: Dr. Berniece Salines, DO  HF Cardiology: Dr. [name]  HPI: 67 y.o. 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    He has had multiple hospitalizations in 2021 for CHF exacerbation, requiring IV Lasix and change in PO regimen from lasix to torsemide. Echo 4/21 showed normal LVEF 55-60% w/ G1DD. RV not well visualized, but felt to have component of RV failure. [place] 4/21 showed normal PA pressure and normal cardiac output. PFTs also completed and c/w mild obstructive airway disease.   In 7/21, he had a coronary CTA.  This showed no coronary calcium but there was a concern for soft plaque in the distal LCx and distal RCA with hemodynamically significant stenosis.  He was managed medically. He has not had any chest pain, but was started on ranolazine after his CTA.  He feels like it makes his "head fuzzy."    Admitted with A/C diastolic heart failure 123456. Diuresed with IV lasix and transitioned to bumex 3 mg twice a day. Discharge weight 360 pounds.   He was admitted again in 12/21 with CHF.  Echo in 12/21 showed EF 123456, grade 2 diastolic dysfunction, RV poorly visualized.   Admitted [id number] with A/C diastolic heart failure. Diuresed with IV lasix + metolazone. Transitioned to torsemide 60 mg twice a day and metolazone twice a day. D/C weight 364 pounds.   Admitted 5/28//22 with cellulitis. Treated with antibiotics.   Admitted 09/20/20 with  A/C CHF. He diuresed 5 L with IV lasix, weight 371 at discharge.  He was readmitted for A/C CHF 7/22. He was diuresed with IV lasix + metolazone. RHC showed  optimized filling pressures and preserved CO/CI. He was discharged on torsemide 100 mg bid + metolazone M/W/F. Discharge weight 374 lbs.  RHC (8/22) showed normal filling pressures & he underwent Cardiomems placement.  Admitted  9/10-9/12/22 for acute encephalopathy, felt secondary to polypharmacy. Ambien and gabapentin were briefly held. Ammonia mildly elevated and he was started on lactulose. [place] returned to baseline and he was discharged home with Home Health PT/OT.  Saw ID for suspected cellulitis. No active infection identified. Referral to lymphedema clinic submitted.   Admitted 3/23 with a/c dHF and worsening LE edema. Echo showed EF 60-65% RV normal this admission. He was diuresed with IV Lasix, down 20 lbs. LE venous dopplers negative DVT. Hospitalization complicated by severe  hypokalemia, discharged home on KCL 100 tid, weight 377.7 lbs. Cardiomems goal lowered to 9.  Instructed to use Furoscix x 3 days with 14 lb weight gain in 1 week, he completed 2 doses (8/24 and 8/25).   Admitted 11/23 with a/c dHF, weight up to 407 lbs, despite normal PAD readings. He was diuresed with IV lasix and metolazone.  Echo showed EF 55-60%, RV not well-visualized. Discharged home, weight 385 lbs.  Today he returns for post hospital HF follow up with [facility] with paramedicine. Overall feeling fine. Leg swelling has improved. He is able to get around his house without significant dyspnea. Denies palpitations, CP, dizziness, abnormal bleeding or PND/Orthopnea. Appetite ok. No fever or chills. Weight at home 380 pounds. Taking all medications. Feels better at 371 lbs.  Cardiomems: PAD 6 (goal 9).   ECG (personally reviewed): NSR 70 bpm  PMH: 1. HTN 2. OSA 3. Hyperlipidemia 4. Low back pain 5. PE/DVT in 2/21.  - V/Q scan in 6/21 was not  suggestive of chronic PE.  6. Nonalcoholic steatohepatitis.  7. GI bleed in 10/20.  8. Anxiety 9. CAD: Coronary CTA in 7/21 with calcium score 0 but concern for soft plaque distal CFX and RCA.  By [facility], there was flow-limiting stenosis in distal CFX and distal RCA.  10. Chronic diastolic CHF: Echo in 6/37 with EF 55-60%, RV poorly visualized.  - RHC (4/21): mean RA 8, PA 20/8, mean PCWP  11, CI 2.8 - Echo (12/21): EF 85-88%, grade 2 diastolic dysfunction, RV poorly visualized.   - RHC (8/22) normal filling pressures, RA mean 7, RV 29/9, PA 32/15, mean 22, PCWP mean 13, CO/CI 7.32/2.41. - Cardiomems (8/22) placement. - Echo (3/23): EF 60-65%, mild LVH, normal RV 11. PFTs (4/21): Mild obstruction.  12. ABIs (10/21): Normal.  Review of Systems: All systems reviewed and negative except as per HPI.   Current Outpatient Medications  Medication Sig Dispense Refill   apixaban (ELIQUIS) 5 MG TABS tablet Take 1 tablet (5 mg total) by mouth 2 (two) times daily. 60 tablet 3   Buprenorphine HCl-Naloxone HCl 8-2 MG FILM Place 1 Film under the tongue in the morning and at bedtime.     eplerenone (INSPRA) 50 MG tablet Take 2 tablets (100 mg total) by mouth daily. 60 tablet 6   gabapentin (NEURONTIN) 800 MG tablet Take 1 tablet (800 mg total) by mouth 3 (three) times daily. 90 tablet 1   lactulose, encephalopathy, (CHRONULAC) 10 GM/15ML SOLN Take 30 mLs (20 g total) by mouth 2 (two) times daily.     metolazone (ZAROXOLYN) 2.5 MG tablet Take 1 tab, 4days a week on Monday, Wednesday  Fridays and Saturdays 30 tablet 0   metoprolol succinate (TOPROL XL) 25 MG 24 hr tablet Take 1 tablet (25 mg total) by mouth at bedtime. 30 tablet 11   ondansetron (ZOFRAN-ODT) 4 MG disintegrating tablet Take 4 mg by mouth every 8 (eight) hours as needed for nausea or vomiting (dissolve orally).     pantoprazole (PROTONIX) 40 MG tablet Take 40 mg by mouth 2 (two) times daily before a meal.     polyethylene glycol (MIRALAX / GLYCOLAX) 17 g packet Take 17 g by mouth as needed for mild constipation (mix and drink as directed).     potassium chloride SA (KLOR-CON M) 20 MEQ tablet Take 5 tablets (100 mEq total) by mouth 3 (three) times daily. Take one tablet extra (40 meq) with metolazone. (Patient taking differently: Take 40-120 mEq by mouth See admin instructions. Take 120 mEq by mouth in the morning, 100 mEq at  noontime, and 100 mEq in the evening. Take an additional 40 mEq by mouth on Mon/Wed/Fri when taking Metolazone.) 463 tablet 6   pravastatin (PRAVACHOL) 20 MG tablet Take 1 tablet (20 mg total) by mouth daily. 30 tablet 3   sertraline (ZOLOFT) 50 MG tablet Take 1 tablet (50 mg total) by mouth daily. 30 tablet 3   tamsulosin (FLOMAX) 0.4 MG CAPS capsule Take 1 capsule (0.4 mg total) by mouth daily. Additional refills will need to come from PCP 30 capsule 0   torsemide (DEMADEX) 100 MG tablet Take 1 tablet (100 mg total) by mouth See admin instructions. Take 100 mg by mouth in the morning and evening 90 tablet 1   triamcinolone (KENALOG) 0.025 % ointment Apply 1 application. topically 2 (two) times daily. (Patient taking differently: Apply 1 application  topically See admin instructions. Apply to both lower legs/feet 2 times a day) 30 g 0  VENTOLIN HFA 108 (90 Base) MCG/ACT inhaler Inhale 1 puff into the lungs every 4 (four) hours as needed for shortness of breath. (Patient taking differently: Inhale 2 puffs into the lungs every 4 (four) hours as needed for shortness of breath or wheezing.) 6.7 g 1   zolpidem (AMBIEN) 10 MG tablet Take 1 tablet (10 mg total) by mouth at bedtime as needed for sleep. (Patient taking differently: Take 10 mg by mouth at bedtime.) 30 tablet 0   No current facility-administered medications for this encounter.   Allergies  Allergen Reactions   Penicillins Other (See Comments)    Caused convulsions as a child  Did it involve swelling of the face/tongue/throat, SOB, or low BP? N/A Did it involve sudden or severe rash/hives, skin peeling, or any reaction on the inside of your mouth or nose? N/A Did you need to seek medical attention at a hospital or doctor's office?N/A When did it last happen? N/A If all above answers are "NO", may proceed with cephalosporin use.   Social History   Socioeconomic History   Marital status: Single    Spouse name: Not on file   Number of  children: Not on file   Years of education: Not on file   Highest education level: Not on file  Occupational History   Not on file  Tobacco Use   Smoking status: Never   Smokeless tobacco: Never  Vaping Use   Vaping Use: Never used  Substance and Sexual Activity   Alcohol use: Not Currently   Drug use: Not Currently    Types: Marijuana    Comment: Smoked for 30 years   Sexual activity: Not on file  Other Topics Concern   Not on file  Social History Narrative   Not on file   Social Determinants of Health   Financial Resource Strain: Medium Risk (11/15/2020)   Overall Financial Resource Strain (CARDIA)    Difficulty of Paying Living Expenses: Somewhat hard  Food Insecurity: No Food Insecurity (03/08/2022)   Hunger Vital Sign    Worried About Running Out of Food in the Last Year: Never true    Ran Out of Food in the Last Year: Never true  Transportation Needs: Unmet Transportation Needs (03/08/2022)   PRAPARE - Hydrologist (Medical): Yes    Lack of Transportation (Non-Medical): Yes  Physical Activity: Not on file  Stress: Not on file  Social Connections: Not on file  Intimate Partner Violence: Not At Risk (03/08/2022)   Humiliation, Afraid, Rape, and Kick questionnaire    Fear of Current or Ex-Partner: No    Emotionally Abused: No    Physically Abused: No    Sexually Abused: No    Family History  Problem Relation Age of Onset   Hyperlipidemia Mother    Hypertension Mother    Stroke Mother    CAD Maternal Grandmother    CAD Maternal Grandfather    BP 138/72   Pulse 90   Wt (!) 172.8 kg (381 lb)   SpO2 (!) 9%   BMI 39.84 kg/m   Wt Readings from Last 3 Encounters:  04/15/22 (!) 172.8 kg (381 lb)  03/31/22 (!) 176.9 kg (390 lb)  03/24/22 (!) 173.7 kg (383 lb)   PHYSICAL EXAM: General:  NAD. No resp difficulty, arrived in [facility] HEENT: Normal Neck: Supple. No JVD. Carotids 2+ bilat; no bruits. No lymphadenopathy or thryomegaly  appreciated. Cor: PMI nondisplaced. Regular rate & rhythm. No rubs, gallops or murmurs. Lungs:  Clear Abdomen: Soft, obese, nontender, nondistended. No hepatosplenomegaly. No bruits or masses. Good bowel sounds. Extremities: No cyanosis, clubbing, rash, 1+ BLE edema to knees, chronic venous stasis changes to legs. Neuro: Alert & oriented x 3, cranial nerves grossly intact. Moves all 4 extremities w/o difficulty. Affect pleasant.   ASSESSMENT & PLAN: 1. Chronic diastolic CHF with suspected RV dysfunction: Echo 7/22 with EF 60-65%, mild LVH, normal RV, unable to estimate PA systolic pressure. Multiple CHF admissions despite aggressive outpatient diuretic regimen and paramedicine.  RHC (7/22) with optimized filling pressures, PCWP 13, CO/CI 7.3/2.4. Received IV lasix 80 mg through paramedicine 11/12/20. RHC (8/22) with normal filling pressures; underwent placement of Cardiomems (dPAP goal 9 mmHg). Re-admitted w/ marked volume overload and slight progression of symptoms 3/23. Echo 3/23 EF 60-65% RV normal this admission.  Echo this admission 11/23 with EF 55-60%, RV not well-visualized. Admitted with 20 lb weight gain despite no major change in his Cardiomems reading. Today, stable NYHA II symptoms, functional class confounded by body habitus. He is not volume overloaded on exam or by Cardiomems. - Continue torsemide 100 mg bid + metolazone 2.5 mg M-W-F-S. BMET and BNP today. - Continue eplerenone 100 mg daily.  - Continue Toprol XL 25 mg daily.  - Off Jardiance (GI upset). - He has 2 doses of Furoscix at home. 2. H/o PE/DVT: Suspect due to sedentary lifestyle.  Occurred in 2/21.  V/Q scan in 6/21 did not show evidence for chronic PE.   - Continue Eliquis.  3. OSA: Cannot tolerate CPAP. 4. Anxiety/depression: This is a major issue.   - Continue sertraline 100 mg daily.  5. Hypertension: Stable. Continue current meds. 6. Atrial fibrillation: Paroxysmal. In SR.  - Continue apixaban.  - Continue  Toprol XL. 7. Cirrhosis: CT scan in past noted nodular contour of liver, compatible with underlying cirrhosis. In setting of known RV dysfunction and suspect cardiogenic. LFTs ok. EGD 12/22 showed portal hypertensive gastropathy, no varices.  This admission, abdominal US showed no ascites.  - On lactulose (also takes this for chronic constipation)  - Continue spironolactone.  8. Iron deficiency anemia: Received feraheme this admit   Continue paramedicine, appreciate their help.   Follow up in 8 weeks with APP and 4 months with Dr. Newman Nip, FNP-BC 04/15/22

## 2022-04-15 ENCOUNTER — Encounter (HOSPITAL_COMMUNITY): Payer: Self-pay

## 2022-04-15 ENCOUNTER — Ambulatory Visit (HOSPITAL_COMMUNITY)
Admission: RE | Admit: 2022-04-15 | Discharge: 2022-04-15 | Disposition: A | Discharge status: Home / Self Care | Payer: Medicare HMO | Source: Ambulatory Visit | Attending: Family Medicine | Admitting: Family Medicine

## 2022-04-15 ENCOUNTER — Other Ambulatory Visit (HOSPITAL_COMMUNITY): Payer: Self-pay

## 2022-04-15 VITALS — BP 138/72 | HR 90 | Wt 381.0 lb

## 2022-04-15 DIAGNOSIS — Z5986 Financial insecurity: Secondary | ICD-10-CM | POA: Diagnosis not present

## 2022-04-15 DIAGNOSIS — Z7901 Long term (current) use of anticoagulants: Secondary | ICD-10-CM | POA: Diagnosis not present

## 2022-04-15 DIAGNOSIS — K5909 Other constipation: Secondary | ICD-10-CM | POA: Diagnosis not present

## 2022-04-15 DIAGNOSIS — F32A Depression, unspecified: Secondary | ICD-10-CM | POA: Diagnosis not present

## 2022-04-15 DIAGNOSIS — I5032 Chronic diastolic (congestive) heart failure: Secondary | ICD-10-CM | POA: Diagnosis not present

## 2022-04-15 DIAGNOSIS — I110 Hypertensive heart disease with heart failure: Secondary | ICD-10-CM | POA: Insufficient documentation

## 2022-04-15 DIAGNOSIS — F419 Anxiety disorder, unspecified: Secondary | ICD-10-CM | POA: Insufficient documentation

## 2022-04-15 DIAGNOSIS — K7460 Unspecified cirrhosis of liver: Secondary | ICD-10-CM | POA: Insufficient documentation

## 2022-04-15 DIAGNOSIS — Z6839 Body mass index (BMI) 39.0-39.9, adult: Secondary | ICD-10-CM | POA: Diagnosis not present

## 2022-04-15 DIAGNOSIS — Z86711 Personal history of pulmonary embolism: Secondary | ICD-10-CM | POA: Insufficient documentation

## 2022-04-15 DIAGNOSIS — I480 Paroxysmal atrial fibrillation: Secondary | ICD-10-CM | POA: Diagnosis not present

## 2022-04-15 DIAGNOSIS — G4733 Obstructive sleep apnea (adult) (pediatric): Secondary | ICD-10-CM | POA: Diagnosis not present

## 2022-04-15 DIAGNOSIS — D649 Anemia, unspecified: Secondary | ICD-10-CM | POA: Diagnosis not present

## 2022-04-15 DIAGNOSIS — I10 Essential (primary) hypertension: Secondary | ICD-10-CM

## 2022-04-15 DIAGNOSIS — Z8249 Family history of ischemic heart disease and other diseases of the circulatory system: Secondary | ICD-10-CM | POA: Insufficient documentation

## 2022-04-15 DIAGNOSIS — E669 Obesity, unspecified: Secondary | ICD-10-CM | POA: Insufficient documentation

## 2022-04-15 DIAGNOSIS — Z86718 Personal history of other venous thrombosis and embolism: Secondary | ICD-10-CM | POA: Insufficient documentation

## 2022-04-15 DIAGNOSIS — J449 Chronic obstructive pulmonary disease, unspecified: Secondary | ICD-10-CM | POA: Diagnosis not present

## 2022-04-15 DIAGNOSIS — D509 Iron deficiency anemia, unspecified: Secondary | ICD-10-CM | POA: Diagnosis not present

## 2022-04-15 DIAGNOSIS — Z79899 Other long term (current) drug therapy: Secondary | ICD-10-CM | POA: Insufficient documentation

## 2022-04-15 LAB — BASIC METABOLIC PANEL
Anion gap: 12 (ref 5–15)
BUN: 19 mg/dL (ref 8–23)
CO2: 34 mmol/L — ABNORMAL HIGH (ref 22–32)
Calcium: 8.8 mg/dL — ABNORMAL LOW (ref 8.9–10.3)
Chloride: 89 mmol/L — ABNORMAL LOW (ref 98–111)
Creatinine, Ser: 0.91 mg/dL (ref 0.61–1.24)
GFR, Estimated: >60 mL/min (ref >60)
Glucose, Bld: 121 mg/dL — ABNORMAL HIGH (ref 70–99)
Potassium: 2.8 mmol/L — ABNORMAL LOW (ref 3.5–5.1)
Sodium: 135 mmol/L (ref 135–145)

## 2022-04-15 LAB — BRAIN NATRIURETIC PEPTIDE: B Natriuretic Peptide: 59.8 pg/mL (ref 0.0–100.0)

## 2022-04-15 NOTE — Patient Instructions (Addendum)
Thank you for coming in today  Labs were done today, if any labs are abnormal the clinic will call you No news is good news  EKG today   Your physician recommends that you schedule a follow-up appointment in:  8 weeks in clinic   4 months with Dr. Kendall Flack will receive a reminder letter in the mail a few months in advance. If you don't receive a letter, please call our office to schedule the follow-up appointment.    Do the following things EVERYDAY: Weigh yourself in the morning before breakfast. Write it down and keep it in a log. Take your medicines as prescribed Eat low salt foods--Limit salt (sodium) to 2000 mg per day.  Stay as active as you can everyday Limit all fluids for the day to less than 2 liters  At the [facility], you and your health needs are our priority. As part of our continuing mission to provide you with exceptional heart care, we have created designated Provider Care Teams. These Care Teams include your primary Cardiologist (physician) and Advanced Practice Providers (APPs- Physician Assistants and Nurse Practitioners) who all work together to provide you with the care you need, when you need it.   You may see any of the following providers on your designated Care Team at your next follow up: Dr Glori Bickers Dr Loralie Champagne Dr. Roxana Hires, NP Lyda Jester, [place] [facility] [place], [place] Forestine Na, NP Audry Riles, PharmD   Please be sure to bring in all your medications bottles to every appointment.    If you have any questions or concerns before your next appointment please send [place] a message through [place] or call our office at [phone].    TO LEAVE A MESSAGE FOR THE NURSE SELECT OPTION 2, PLEASE LEAVE A MESSAGE INCLUDING: YOUR NAME DATE OF BIRTH CALL BACK NUMBER REASON FOR CALL**this is important as we prioritize the call backs  YOU WILL RECEIVE A CALL BACK THE SAME DAY AS LONG AS YOU  CALL BEFORE 4:00 PM

## 2022-04-15 NOTE — Progress Notes (Signed)
Paramedicine Encounter    Patient ID: Charles Gray, male    DOB: 09/28/1955, 67 y.o.   MRN: [id number]  Arrived for clinic visit with Froylan who was seen by Allena Katz who assessed patient and stated no med changes today and reminded him to keep up the good work of completing cardio-mems. We discussed med compliance as well.   I reviewed meds and confirmed same filling pill box for two weeks.  Refills as noted: -Gabapentin -Metolazone -Eliquis -Zoloft   He will have labs today  (Labs resulted and potassium low, med changes noted per Allena Katz NP adding 40MEQ Potassium for 3 days- I called and advised patient of same)   Appointments reviewed and confirmed. Clinic visit complete. I will see Delroy in two weeks.   [name], [place] 04/15/2022    Patient Care Team: Enid Skeens., MD as PCP - General (Family Medicine) Larey Dresser, MD as PCP - Cardiology (Cardiology) Jorge Ny, LCSW as Social Worker (Licensed Clinical Social Worker)  Patient Active Problem List   Diagnosis Date Noted   Acute on chronic heart failure with preserved ejection fraction (HFpEF) ([place]) 03/04/2022   Dyslipidemia 07/17/2021   Lymphedema 07/08/2021   Melena    Gastric polyps    UGIB (upper gastrointestinal bleed) 04/06/2021   Acute encephalopathy 12/21/2020   COPD (chronic obstructive pulmonary disease) ([place]) 12/21/2020   Chronic diastolic (congestive) heart failure ([place]) 11/14/2020   Syncope 10/28/2020   CHF (congestive heart failure) ([place]) 10/28/2020   Chronic venous stasis dermatitis of both lower extremities 08/27/2020   Cellulitis of both lower extremities 08/14/2020   Iron deficiency anemia 08/14/2020   GERD without esophagitis 06/10/2020   Atrial fibrillation with rapid ventricular response ([place]) 03/24/2020   Acute on chronic right heart failure ([place]) 01/27/2020   Acute respiratory failure with hypoxia (HCC)    Fatty liver disease, nonalcoholic     Shock ([place])    Personal history of DVT (deep vein thrombosis) 12/26/2019   Chronic anticoagulation 12/26/2019   Palpitations 12/26/2019   Paroxysmal atrial fibrillation ([place]) 12/23/2019   Heart failure, systolic, acute ([place]) 40/97/3532   Acute on chronic congestive heart failure (HCC)    Acute on chronic diastolic CHF (congestive heart failure) ([place]) 08/23/2019   Chest pain 08/23/2019   Lactic acidosis 08/23/2019   History of pulmonary embolism 08/12/2019   Atrial tachycardia 08/12/2019   Occasional tremors 08/12/2019   OSA (obstructive sleep apnea) 08/12/2019   AKI (acute kidney injury) ([place]) 08/08/2019   Acute on chronic right-sided heart failure ([place]) 08/06/2019   Acute pulmonary embolism ([place]) 06/08/2019   Normocytic anemia 06/08/2019   Pulmonary emboli ([place]) 06/08/2019   Chronic heart failure with preserved ejection fraction ([place]) 05/30/2019   Class 2 severe obesity due to excess calories with serious comorbidity and body mass index (BMI) of 39.0 to 39.9 in adult ([place]) 05/30/2019   Morbid obesity with BMI of 40.0-44.9, adult ([place]) 05/30/2019   Hyponatremia    Drug induced constipation    Peripheral edema    Hypotension due to drugs    Hypoalbuminemia due to protein-calorie malnutrition (HCC)    Thrombocytopenia (HCC)    Anemia of chronic disease    Cirrhosis of liver without ascites ([place])    Chronic pain syndrome    Debility 03/13/2019   Portal hypertensive gastropathy ([place]) 03/06/2019   Dyspnea    GIB (gastrointestinal bleeding) 03/04/2019   Mixed hyperlipidemia 01/24/2019   Insomnia 01/24/2019   Hyperlipidemia 01/24/2019   Essential  hypertension 01/24/2019   Lumbar disc herniation 01/24/2019    Current Outpatient Medications:    apixaban (ELIQUIS) 5 MG TABS tablet, Take 1 tablet (5 mg total) by mouth 2 (two) times daily., Disp: 60 tablet, Rfl: 3   Buprenorphine HCl-Naloxone HCl 8-2 MG FILM, Place 1 Film under the tongue in the morning and at bedtime., Disp: ,  Rfl:    eplerenone (INSPRA) 50 MG tablet, Take 2 tablets (100 mg total) by mouth daily., Disp: 60 tablet, Rfl: 6   gabapentin (NEURONTIN) 800 MG tablet, Take 1 tablet (800 mg total) by mouth 3 (three) times daily., Disp: 90 tablet, Rfl: 1   lactulose, encephalopathy, (CHRONULAC) 10 GM/15ML SOLN, Take 30 mLs (20 g total) by mouth 2 (two) times daily., Disp: , Rfl:    metolazone (ZAROXOLYN) 2.5 MG tablet, Take 1 tab, 4days a week on Monday, Wednesday  Fridays and Saturdays, Disp: 30 tablet, Rfl: 0   metoprolol succinate (TOPROL XL) 25 MG 24 hr tablet, Take 1 tablet (25 mg total) by mouth at bedtime., Disp: 30 tablet, Rfl: 11   ondansetron (ZOFRAN-ODT) 4 MG disintegrating tablet, Take 4 mg by mouth every 8 (eight) hours as needed for nausea or vomiting (dissolve orally)., Disp: , Rfl:    pantoprazole (PROTONIX) 40 MG tablet, Take 40 mg by mouth 2 (two) times daily before a meal., Disp: , Rfl:    polyethylene glycol (MIRALAX / GLYCOLAX) 17 g packet, Take 17 g by mouth as needed for mild constipation (mix and drink as directed)., Disp: , Rfl:    potassium chloride SA (KLOR-CON M) 20 MEQ tablet, Take 5 tablets (100 mEq total) by mouth 3 (three) times daily. Take one tablet extra (40 meq) with metolazone. (Patient taking differently: Take 40-120 mEq by mouth See admin instructions. Take 120 mEq by mouth in the morning, 100 mEq at noontime, and 100 mEq in the evening. Take an additional 40 mEq by mouth on Mon/Wed/Fri when taking Metolazone.), Disp: 463 tablet, Rfl: 6   pravastatin (PRAVACHOL) 20 MG tablet, Take 1 tablet (20 mg total) by mouth daily., Disp: 30 tablet, Rfl: 3   sertraline (ZOLOFT) 50 MG tablet, Take 1 tablet (50 mg total) by mouth daily., Disp: 30 tablet, Rfl: 3   tamsulosin (FLOMAX) 0.4 MG CAPS capsule, Take 1 capsule (0.4 mg total) by mouth daily. Additional refills will need to come from PCP, Disp: 30 capsule, Rfl: 0   torsemide (DEMADEX) 100 MG tablet, Take 1 tablet (100 mg total) by mouth  See admin instructions. Take 100 mg by mouth in the morning and evening, Disp: 90 tablet, Rfl: 1   triamcinolone (KENALOG) 0.025 % ointment, Apply 1 application. topically 2 (two) times daily. (Patient taking differently: Apply 1 application  topically See admin instructions. Apply to both lower legs/feet 2 times a day), Disp: 30 g, Rfl: 0   VENTOLIN HFA 108 (90 Base) MCG/ACT inhaler, Inhale 1 puff into the lungs every 4 (four) hours as needed for shortness of breath. (Patient taking differently: Inhale 2 puffs into the lungs every 4 (four) hours as needed for shortness of breath or wheezing.), Disp: 6.7 g, Rfl: 1   zolpidem (AMBIEN) 10 MG tablet, Take 1 tablet (10 mg total) by mouth at bedtime as needed for sleep. (Patient taking differently: Take 10 mg by mouth at bedtime.), Disp: 30 tablet, Rfl: 0 Allergies  Allergen Reactions   Penicillins Other (See Comments)    Caused convulsions as a child  Did it involve swelling of the  face/tongue/throat, SOB, or low BP? N/A Did it involve sudden or severe rash/hives, skin peeling, or any reaction on the inside of your mouth or nose? N/A Did you need to seek medical attention at a hospital or doctor's office?N/A When did it last happen? N/A If all above answers are "NO", may proceed with cephalosporin use.     Social History   Socioeconomic History   Marital status: Single    Spouse name: Not on file   Number of children: Not on file   Years of education: Not on file   Highest education level: Not on file  Occupational History   Not on file  Tobacco Use   Smoking status: Never   Smokeless tobacco: Never  Vaping Use   Vaping Use: Never used  Substance and Sexual Activity   Alcohol use: Not Currently   Drug use: Not Currently    Types: Marijuana    Comment: Smoked for 30 years   Sexual activity: Not on file  Other Topics Concern   Not on file  Social History Narrative   Not on file   Social Determinants of Health   Financial  Resource Strain: Medium Risk (11/15/2020)   Overall Financial Resource Strain (CARDIA)    Difficulty of Paying Living Expenses: Somewhat hard  Food Insecurity: No Food Insecurity (03/08/2022)   Hunger Vital Sign    Worried About Running Out of Food in the Last Year: Never true    Ran Out of Food in the Last Year: Never true  Transportation Needs: Unmet Transportation Needs (03/08/2022)   PRAPARE - Hydrologist (Medical): Yes    Lack of Transportation (Non-Medical): Yes  Physical Activity: Not on file  Stress: Not on file  Social Connections: Not on file  Intimate Partner Violence: Not At Risk (03/08/2022)   Humiliation, Afraid, Rape, and Kick questionnaire    Fear of Current or Ex-Partner: No    Emotionally Abused: No    Physically Abused: No    Sexually Abused: No    Physical Exam      Future Appointments  Date Time Provider [place]  04/21/2022  3:45 PM Yevonne Pax, DPM [facility]  06/15/2022  9:00 AM [facility] PA/NP [facility] None     ACTION: Home visit completed

## 2022-04-16 ENCOUNTER — Telehealth (HOSPITAL_COMMUNITY): Payer: Self-pay | Admitting: *Deleted

## 2022-04-16 DIAGNOSIS — Z79899 Other long term (current) drug therapy: Secondary | ICD-10-CM

## 2022-04-16 DIAGNOSIS — I5032 Chronic diastolic (congestive) heart failure: Secondary | ICD-10-CM

## 2022-04-16 NOTE — Telephone Encounter (Signed)
Called patient per Allena Katz, NP and spoke with CMA and patient with following lab results and instructions. CMA said Heather Journalist, newspaper) had already made these changes. I called and confirmed with Heather that changes with potassium doses had been made.   "K is very low. Please ensure he is taking 100 KCL tid. Needs to take extra 40 for the next 3 days. Please ensure he is taking additional 40 KCL on metolazone days. Please repeat BMET with Mag next week"  Repeat labs scheduled next Thursday; per [place] Journalist, newspaper) arranges transportation. Spoke again with Nira Conn and confirmed appt time/date and that she will confirm transportation with patient.

## 2022-04-21 ENCOUNTER — Ambulatory Visit: Payer: Medicare HMO | Admitting: Podiatry

## 2022-04-21 ENCOUNTER — Telehealth: Payer: Self-pay

## 2022-04-21 ENCOUNTER — Telehealth (HOSPITAL_COMMUNITY): Payer: Self-pay

## 2022-04-21 NOTE — Telephone Encounter (Signed)
   Telephone encounter was:  Successful.  04/21/2022 Name: [name] MRN: [id number] DOB: [birth year]  Charles Gray is a 67 y.o. year old male who is a primary care patient of Slatosky, Marshall Cork., MD . The community resource team was consulted for assistance with Transportation Needs   Care guide performed the following interventions: Spoke with patient regarding transportation for 04/23/2022. Sent request to [employer].  Follow Up Plan:   I will call the patient once I receive confirmation from [place].  04/21/2022  [name] DOB: [birth year] MRN: [id number]   RIDER WAIVER AND RELEASE OF LIABILITY  For the purposes of helping with transportation needs, [facility] partners with outside transportation providers (taxi companies, [place], Social research officer, government.) to give [employer] patients or other approved people the choice of on-demand rides [employer]") to our buildings for non-emergency visits.  By using [employer], I, the person signing this document, on behalf of myself and/or any legal minors (in my care using the [employer]), agree:  Government social research officer given to me are supplied by independent, outside transportation providers who do not work for, or have any affiliation with, [employer]. [place] is not a transportation company. [facility] has no control over the quality or safety of the rides I get using [employer]. [facility] has no control over whether any outside ride will happen on time or not. [facility] gives no guarantee on the reliability, quality, safety, or availability on any rides, or that no mistakes will happen. I know and accept that traveling by vehicle (car, truck, SVU, Lucianne Lei, bus, taxi, etc.) has risks of serious injuries such as disability, being paralyzed, and death. I know and agree the risk of using [employer] is mine alone, and not [employer]. Transport Services are provided "as is" and as are available. The transportation providers  are in charge for all inspections and care of the vehicles used to provide these rides. I agree not to take legal action against [place], its agents, employees, officers, directors, representatives, insurers, attorneys, assigns, successors, subsidiaries, and affiliates at any time for any reasons related directly or indirectly to using [employer]. I also agree not to take legal action against  or its affiliates for any injury, death, or damage to property caused by or related to using [employer]. I have read this Waiver and Release of Liability, and I understand the terms used in it and their legal meaning. This Waiver is freely and voluntarily given with the understanding that my right (or any legal minors) to legal action against [place] relating to [employer] is knowingly given up to use these services.   I attest that I read the Ride Waiver and Release of Liability to [name], gave Mr. Wager the opportunity to ask questions and answered the questions asked (if any). I affirm that Shaun Runyon then provided consent for assistance with transportation.     [place] Community Resource Care Guide   ??[email or web address]  ?? [phone]   Website: [email or web address]  [email or web address]

## 2022-04-21 NOTE — Telephone Encounter (Signed)
   Telephone encounter was:  Unsuccessful.  04/21/2022 Name: Charles Gray MRN: [id number] DOB: 1955-12-16  Unsuccessful outbound call made today to assist with:  Transportation Needs   Outreach Attempt:  1st Attempt  A HIPAA compliant voice message was left requesting a return call.  Instructed patient to call back at [phone].  [place] Resource Care Guide   ??[email or web address]  ?? [phone]   Website: [email or web address]  [email or web address]

## 2022-04-21 NOTE — Telephone Encounter (Signed)
Mr. Shiller reached out needing transportation for his lab appointment on Thursday 04/23/22 at 11:00 at the [facility]. I see he has had [facility] assist with this recently. I will forward to same for further to see if they are able to assist with his ride. He is aware I am reaching out to them and was greatly appreciative as he said their services we're very helpful during his last trip. Call complete. I will follow up.   [name], [place] 04/21/2022

## 2022-04-21 NOTE — Telephone Encounter (Signed)
   Telephone encounter was:  Successful.  04/21/2022 Name: Charles Gray MRN: [id number] DOB: [birth year]  Charles Gray is a 67 y.o. year old male who is a primary care patient of Slatosky, Marshall Cork., MD . The community resource team was consulted for assistance with Transportation Needs   Care guide performed the following interventions: Spoke to patient to confirm pickup time and provider.  Transportation provider [place] Time: 10:20 AM.  Follow Up Plan:  No further follow up planned at this time. The patient has been provided with needed resources.  [place] Resource Care Guide   ??[email or web address]  ?? [phone]   Website: [email or web address]  [email or web address]

## 2022-04-23 ENCOUNTER — Telehealth (HOSPITAL_COMMUNITY): Payer: Self-pay | Admitting: Adult Health

## 2022-04-23 ENCOUNTER — Ambulatory Visit (HOSPITAL_COMMUNITY)
Admission: RE | Admit: 2022-04-23 | Discharge: 2022-04-23 | Disposition: A | Discharge status: Home / Self Care | Payer: Medicare HMO | Source: Ambulatory Visit | Attending: Internal Medicine | Admitting: Internal Medicine

## 2022-04-23 ENCOUNTER — Telehealth (HOSPITAL_COMMUNITY): Payer: Self-pay

## 2022-04-23 DIAGNOSIS — I5032 Chronic diastolic (congestive) heart failure: Secondary | ICD-10-CM | POA: Insufficient documentation

## 2022-04-23 DIAGNOSIS — Z79899 Other long term (current) drug therapy: Secondary | ICD-10-CM | POA: Insufficient documentation

## 2022-04-23 LAB — BASIC METABOLIC PANEL
Anion gap: 10 (ref 5–15)
BUN: 20 mg/dL (ref 8–23)
CO2: 30 mmol/L (ref 22–32)
Calcium: 9.1 mg/dL (ref 8.9–10.3)
Chloride: 94 mmol/L — ABNORMAL LOW (ref 98–111)
Creatinine, Ser: 1.03 mg/dL (ref 0.61–1.24)
GFR, Estimated: >60 mL/min (ref >60)
Glucose, Bld: 138 mg/dL — ABNORMAL HIGH (ref 70–99)
Potassium: 3.4 mmol/L — ABNORMAL LOW (ref 3.5–5.1)
Sodium: 134 mmol/L — ABNORMAL LOW (ref 135–145)

## 2022-04-23 LAB — MAGNESIUM: Magnesium: 2.1 mg/dL (ref 1.7–2.4)

## 2022-04-23 NOTE — Telephone Encounter (Signed)
  Cardiomems Remote Monitoring  S/P Cardiomems Implant 2022  PAD Goal: 9 Most recent reading: 13 --> Suggestive of fluid accumulation   Recommended changes: Please call and verify he is taking metolazone 4 days week. If he is taking metolazone 4 days a week M-W-F-Sat then instruct to take metolazone today with extra 40 meq of potassium . If he has not taken metolazone please reinforce medication compliance.   I continue to review and analyze the patients PA pressures weekly (and more often as needed) to bring PA pressures within the optimal range.      Miliano Cotten NP-C  10:48 AM

## 2022-04-23 NOTE — Telephone Encounter (Signed)
Spoke to [place] he reports his weight at home today [phone] this morning, weight yesterday was 375 yesterday. He states he had metolazone Monday and Wednesday this week and states he skipped Saturdays dose on 1/6. He states he feels he "dries" out on the metolazone days and he ends up drinking a lot of fluid on these days also. He reports he is eating moms meals and oranges, avocados, tomato's and lemon water for snacks in between meals.   I coached him at length about adhering to medication compliance and to be sure he is using pill box I set up weekly including the metolazone and taking potassium as instructed as well.   I discussed with him heart healthy diet choices for meals and snacks and fluid restrictions and sodium limitations he should adhere to daily as well as continuing to weigh daily and checking his cardio mems. We reviewed labs. I will be out to see him on Tuesday in the home. He states he got his legs wrapped today by [facility] RN.  He agreed with home visit on Tuesday.   Call complete.

## 2022-04-23 NOTE — Telephone Encounter (Signed)
Pt reports he is not sure if he took Saturdays dose  Advised importance of medication compliance Will continue M W F Sat dose of Metolazone and extra 40 of potassium

## 2022-04-24 ENCOUNTER — Telehealth (HOSPITAL_COMMUNITY): Payer: Self-pay

## 2022-04-24 NOTE — Telephone Encounter (Signed)
Patient aware and agreeable. 

## 2022-04-28 ENCOUNTER — Other Ambulatory Visit (HOSPITAL_COMMUNITY): Payer: Self-pay

## 2022-04-28 NOTE — Progress Notes (Signed)
Paramedicine Encounter    Patient ID: Charles Gray, male    DOB: [birth year], 67 y.o.   MRN: [id number]   Complaints-generalized body pain, constipation, nausea.  Assessment- lower legs wrapped by [facility] last Thursday. Tired during assessment. Abdominal soft, tender. Lungs clear.   Compliance with meds- no missed doses.   Pill box filled for two weeks.  Refills needed- Torsemide, Metolazone   Meds changes since last visit- NONE     Social changes- NONE    BP 120/64   Pulse 85   Resp 18   Wt (!) 372 lb (168.7 kg)   SpO2 98%   BMI 38.90 kg/m  Weight yesterday-371lbs Last visit weight-381lbs Weight today- 371lbs   Arrived for home visit for Charles Gray who was seated in his recliner alert and oriented, reporting to be feeling okay but having some general body pain, nausea with constipation. He reports his last full bowel movement being yesterday but he had to strain and he got dizzy when doing so. I has had no missed doses of medications over the last week. He denied missing his metolazone and says he has taken it daily.  I reviewed meds and filled pill box for two weeks. Refills as noted called into [facility].  -Torsemide -Metolazone   We reviewed HF diet with increasing potassium diet, reducing sodium, fluid suggestions. He verbalized understanding.   HH PT and RN are coming out once weekly for PT and [employer]. He reports this is helping.   I will plan to see Rahman in two weeks, he knows to reach out to me If needed. Home visit complete.      [name], [place]  ACTION: Home visit completed    Patient Care Team: Enid Skeens., MD as PCP - General (Family Medicine) Larey Dresser, MD as PCP - Cardiology (Cardiology) Jorge Ny, LCSW as Social Worker (Licensed Clinical Social Worker)  Patient Active Problem List   Diagnosis Date Noted   Acute on chronic heart failure with preserved ejection fraction (HFpEF) ([place]) 03/04/2022    Dyslipidemia 07/17/2021   Lymphedema 07/08/2021   Melena    Gastric polyps    UGIB (upper gastrointestinal bleed) 04/06/2021   Acute encephalopathy 12/21/2020   COPD (chronic obstructive pulmonary disease) ([place]) 12/21/2020   Chronic diastolic (congestive) heart failure ([place]) 11/14/2020   Syncope 10/28/2020   CHF (congestive heart failure) ([place]) 10/28/2020   Chronic venous stasis dermatitis of both lower extremities 08/27/2020   Cellulitis of both lower extremities 08/14/2020   Iron deficiency anemia 08/14/2020   GERD without esophagitis 06/10/2020   Atrial fibrillation with rapid ventricular response ([place]) 03/24/2020   Acute on chronic right heart failure ([place]) 01/27/2020   Acute respiratory failure with hypoxia (HCC)    Fatty liver disease, nonalcoholic    Shock ([place])    Personal history of DVT (deep vein thrombosis) 12/26/2019   Chronic anticoagulation 12/26/2019   Palpitations 12/26/2019   Paroxysmal atrial fibrillation ([place]) 12/23/2019   Heart failure, systolic, acute ([place]) 40/98/1191   Acute on chronic congestive heart failure (HCC)    Acute on chronic diastolic CHF (congestive heart failure) ([place]) 08/23/2019   Chest pain 08/23/2019   Lactic acidosis 08/23/2019   History of pulmonary embolism 08/12/2019   Atrial tachycardia 08/12/2019   Occasional tremors 08/12/2019   OSA (obstructive sleep apnea) 08/12/2019   AKI (acute kidney injury) ([place]) 08/08/2019   Acute on chronic right-sided heart failure ([place]) 08/06/2019   Acute pulmonary embolism ([place]) 06/08/2019  Normocytic anemia 06/08/2019   Pulmonary emboli ([place]) 06/08/2019   Chronic heart failure with preserved ejection fraction ([place]) 05/30/2019   Class 2 severe obesity due to excess calories with serious comorbidity and body mass index (BMI) of 39.0 to 39.9 in adult [employer] [facility]) 05/30/2019   Morbid obesity with BMI of 40.0-44.9, adult ([place]) 05/30/2019   Hyponatremia    Drug induced constipation    Peripheral edema     Hypotension due to drugs    Hypoalbuminemia due to protein-calorie malnutrition (HCC)    Thrombocytopenia (HCC)    Anemia of chronic disease    Cirrhosis of liver without ascites (HCC)    Chronic pain syndrome    Debility 03/13/2019   Portal hypertensive gastropathy ([place]) 03/06/2019   Dyspnea    GIB (gastrointestinal bleeding) 03/04/2019   Mixed hyperlipidemia 01/24/2019   Insomnia 01/24/2019   Hyperlipidemia 01/24/2019   Essential hypertension 01/24/2019   Lumbar disc herniation 01/24/2019    Current Outpatient Medications:    apixaban (ELIQUIS) 5 MG TABS tablet, Take 1 tablet (5 mg total) by mouth 2 (two) times daily., Disp: 60 tablet, Rfl: 3   Buprenorphine HCl-Naloxone HCl 8-2 MG FILM, Place 1 Film under the tongue in the morning and at bedtime., Disp: , Rfl:    eplerenone (INSPRA) 50 MG tablet, Take 2 tablets (100 mg total) by mouth daily., Disp: 60 tablet, Rfl: 6   gabapentin (NEURONTIN) 800 MG tablet, Take 1 tablet (800 mg total) by mouth 3 (three) times daily., Disp: 90 tablet, Rfl: 1   lactulose, encephalopathy, (CHRONULAC) 10 GM/15ML SOLN, Take 30 mLs (20 g total) by mouth 2 (two) times daily., Disp: , Rfl:    metolazone (ZAROXOLYN) 2.5 MG tablet, Take 1 tab, 4days a week on Monday, Wednesday  Fridays and Saturdays, Disp: 30 tablet, Rfl: 0   metoprolol succinate (TOPROL XL) 25 MG 24 hr tablet, Take 1 tablet (25 mg total) by mouth at bedtime., Disp: 30 tablet, Rfl: 11   ondansetron (ZOFRAN-ODT) 4 MG disintegrating tablet, Take 4 mg by mouth every 8 (eight) hours as needed for nausea or vomiting (dissolve orally)., Disp: , Rfl:    pantoprazole (PROTONIX) 40 MG tablet, Take 40 mg by mouth 2 (two) times daily before a meal., Disp: , Rfl:    polyethylene glycol (MIRALAX / GLYCOLAX) 17 g packet, Take 17 g by mouth as needed for mild constipation (mix and drink as directed)., Disp: , Rfl:    potassium chloride SA (KLOR-CON M) 20 MEQ tablet, Take 5 tablets (100 mEq total) by mouth 3  (three) times daily. Take one tablet extra (40 meq) with metolazone. (Patient taking differently: Take 100 mEq by mouth 3 (three) times daily. Taking 5 tablets three times daily, 2 extra tablets on Monday, Wednesday, Friday, Saturday with Metolazone.), Disp: 463 tablet, Rfl: 6   pravastatin (PRAVACHOL) 20 MG tablet, Take 1 tablet (20 mg total) by mouth daily., Disp: 30 tablet, Rfl: 3   sertraline (ZOLOFT) 50 MG tablet, Take 1 tablet (50 mg total) by mouth daily., Disp: 30 tablet, Rfl: 3   tamsulosin (FLOMAX) 0.4 MG CAPS capsule, Take 1 capsule (0.4 mg total) by mouth daily. Additional refills will need to come from PCP, Disp: 30 capsule, Rfl: 0   torsemide (DEMADEX) 100 MG tablet, Take 1 tablet (100 mg total) by mouth See admin instructions. Take 100 mg by mouth in the morning and evening, Disp: 90 tablet, Rfl: 1   triamcinolone (KENALOG) 0.025 % ointment, Apply 1 application. topically 2 (two)  times daily. (Patient taking differently: Apply 1 application  topically See admin instructions. Apply to both lower legs/feet 2 times a day), Disp: 30 g, Rfl: 0   VENTOLIN HFA 108 (90 Base) MCG/ACT inhaler, Inhale 1 puff into the lungs every 4 (four) hours as needed for shortness of breath. (Patient taking differently: Inhale 2 puffs into the lungs every 4 (four) hours as needed for shortness of breath or wheezing.), Disp: 6.7 g, Rfl: 1   zolpidem (AMBIEN) 10 MG tablet, Take 1 tablet (10 mg total) by mouth at bedtime as needed for sleep. (Patient taking differently: Take 10 mg by mouth at bedtime.), Disp: 30 tablet, Rfl: 0 Allergies  Allergen Reactions   Penicillins Other (See Comments)    Caused convulsions as a child  Did it involve swelling of the face/tongue/throat, SOB, or low BP? N/A Did it involve sudden or severe rash/hives, skin peeling, or any reaction on the inside of your mouth or nose? N/A Did you need to seek medical attention at a hospital or doctor's office?N/A When did it last happen? N/A If  all above answers are "NO", may proceed with cephalosporin use.     Social History   Socioeconomic History   Marital status: Single    Spouse name: Not on file   Number of children: Not on file   Years of education: Not on file   Highest education level: Not on file  Occupational History   Not on file  Tobacco Use   Smoking status: Never   Smokeless tobacco: Never  Vaping Use   Vaping Use: Never used  Substance and Sexual Activity   Alcohol use: Not Currently   Drug use: Not Currently    Types: Marijuana    Comment: Smoked for 30 years   Sexual activity: Not on file  Other Topics Concern   Not on file  Social History Narrative   Not on file   Social Determinants of Health   Financial Resource Strain: Medium Risk (11/15/2020)   Overall Financial Resource Strain (CARDIA)    Difficulty of Paying Living Expenses: Somewhat hard  Food Insecurity: No Food Insecurity (03/08/2022)   Hunger Vital Sign    Worried About Running Out of Food in the Last Year: Never true    Ran Out of Food in the Last Year: Never true  Transportation Needs: Unmet Transportation Needs (04/21/2022)   PRAPARE - Hydrologist (Medical): Yes    Lack of Transportation (Non-Medical): Yes  Physical Activity: Not on file  Stress: Not on file  Social Connections: Not on file  Intimate Partner Violence: Not At Risk (03/08/2022)   Humiliation, Afraid, Rape, and Kick questionnaire    Fear of Current or Ex-Partner: No    Emotionally Abused: No    Physically Abused: No    Sexually Abused: No    Physical Exam      Future Appointments  Date Time Provider [place]  05/18/2022  3:15 PM [place] [facility]  06/15/2022  9:00 AM [facility] PA/NP [facility] None

## 2022-05-04 ENCOUNTER — Telehealth (HOSPITAL_COMMUNITY): Payer: Self-pay

## 2022-05-04 NOTE — Telephone Encounter (Signed)
Mr. Zapanta reached out to me in reference to requesting his Potassium be in the liquid form rather than the 20MEQ pills. I advised him I would send to HF triage for them to inquire about same from provider.   [facility]   [name], [place] 05/04/2022

## 2022-05-04 NOTE — Telephone Encounter (Signed)
Ok to convert to liquid? See below

## 2022-05-04 NOTE — Telephone Encounter (Signed)
Oral potassium would be fine.

## 2022-05-05 ENCOUNTER — Other Ambulatory Visit (HOSPITAL_COMMUNITY): Payer: Self-pay

## 2022-05-05 DIAGNOSIS — F332 Major depressive disorder, recurrent severe without psychotic features: Secondary | ICD-10-CM | POA: Diagnosis not present

## 2022-05-05 DIAGNOSIS — F1120 Opioid dependence, uncomplicated: Secondary | ICD-10-CM | POA: Diagnosis not present

## 2022-05-05 DIAGNOSIS — F411 Generalized anxiety disorder: Secondary | ICD-10-CM | POA: Diagnosis not present

## 2022-05-05 MED ORDER — POTASSIUM CHLORIDE 20 MEQ/15ML (10%) PO SOLN: 100.0000 meq | Freq: Three times a day (TID) | ORAL | 0 refills | Status: DC

## 2022-05-05 NOTE — Telephone Encounter (Signed)
Liquid potassium sent in

## 2022-05-05 NOTE — Progress Notes (Signed)
Paramedicine Encounter    Patient ID: Charles Gray, male    DOB: May 02, 1955, 67 y.o.   MRN: [id number]   Complaints- NONE  Assessment- A&Ox4, ambulatory with walker, compression stockings on both legs, no shortness of breath on ambulating.   Compliance with meds- no missed doses in the last week.   Pill box filled- for two weeks.   Refills needed- metolazone   Meds changes since last visit- Potassium switched from tablets to liquid.     Social changes- NONE    BP 130/82   Pulse 85   Resp 16   Wt (!) 370 lb (167.8 kg)   SpO2 98%   BMI 38.69 kg/m  Weight yesterday-372lbs Last visit weight--372lbs Weight today- 370lbs   Arrived for home visit for Charles Gray who reports to be feeling okay but states he has some general fatigue with aches and pains and constipation. This is not abnormal for him. He has had no missed doses in the last week. His weight is down 2 lbs from our last visit today.   I obtained vitals as noted.   I reviewed meds and confirmed same filling pill boxes.   He has enough meds for two weeks.   He will start liquid Potassium tomorrow. I reviewed how to dose same.   Cardio Mems is 6 today.   We reviewed appointments, heart healthy diet, med compliance.   Home visit complete.    [name], [place]  ACTION: Home visit completed    Patient Care Team: Enid Skeens., MD as PCP - General (Family Medicine) Larey Dresser, MD as PCP - Cardiology (Cardiology) Jorge Ny, LCSW as Social Worker (Licensed Clinical Social Worker)  Patient Active Problem List   Diagnosis Date Noted   Acute on chronic heart failure with preserved ejection fraction (HFpEF) ([place]) 03/04/2022   Dyslipidemia 07/17/2021   Lymphedema 07/08/2021   Melena    Gastric polyps    UGIB (upper gastrointestinal bleed) 04/06/2021   Acute encephalopathy 12/21/2020   COPD (chronic obstructive pulmonary disease) ([place]) 12/21/2020   Chronic diastolic  (congestive) heart failure ([place]) 11/14/2020   Syncope 10/28/2020   CHF (congestive heart failure) ([place]) 10/28/2020   Chronic venous stasis dermatitis of both lower extremities 08/27/2020   Cellulitis of both lower extremities 08/14/2020   Iron deficiency anemia 08/14/2020   GERD without esophagitis 06/10/2020   Atrial fibrillation with rapid ventricular response ([place]) 03/24/2020   Acute on chronic right heart failure ([place]) 01/27/2020   Acute respiratory failure with hypoxia (HCC)    Fatty liver disease, nonalcoholic    Shock ([place])    Personal history of DVT (deep vein thrombosis) 12/26/2019   Chronic anticoagulation 12/26/2019   Palpitations 12/26/2019   Paroxysmal atrial fibrillation ([place]) 12/23/2019   Heart failure, systolic, acute ([place]) 84/16/6063   Acute on chronic congestive heart failure (HCC)    Acute on chronic diastolic CHF (congestive heart failure) ([place]) 08/23/2019   Chest pain 08/23/2019   Lactic acidosis 08/23/2019   History of pulmonary embolism 08/12/2019   Atrial tachycardia 08/12/2019   Occasional tremors 08/12/2019   OSA (obstructive sleep apnea) 08/12/2019   AKI (acute kidney injury) ([place]) 08/08/2019   Acute on chronic right-sided heart failure ([place]) 08/06/2019   Acute pulmonary embolism ([place]) 06/08/2019   Normocytic anemia 06/08/2019   Pulmonary emboli ([place]) 06/08/2019   Chronic heart failure with preserved ejection fraction ([place]) 05/30/2019   Class 2 severe obesity due to excess calories with serious comorbidity and body mass  index (BMI) of 39.0 to 39.9 in adult [facility]) 05/30/2019   Morbid obesity with BMI of 40.0-44.9, adult ([place]) 05/30/2019   Hyponatremia    Drug induced constipation    Peripheral edema    Hypotension due to drugs    Hypoalbuminemia due to protein-calorie malnutrition (HCC)    Thrombocytopenia (HCC)    Anemia of chronic disease    Cirrhosis of liver without ascites (HCC)    Chronic pain syndrome    Debility 03/13/2019   Portal  hypertensive gastropathy ([place]) 03/06/2019   Dyspnea    GIB (gastrointestinal bleeding) 03/04/2019   Mixed hyperlipidemia 01/24/2019   Insomnia 01/24/2019   Hyperlipidemia 01/24/2019   Essential hypertension 01/24/2019   Lumbar disc herniation 01/24/2019    Current Outpatient Medications:    apixaban (ELIQUIS) 5 MG TABS tablet, Take 1 tablet (5 mg total) by mouth 2 (two) times daily., Disp: 60 tablet, Rfl: 3   Buprenorphine HCl-Naloxone HCl 8-2 MG FILM, Place 1 Film under the tongue in the morning and at bedtime., Disp: , Rfl:    eplerenone (INSPRA) 50 MG tablet, Take 2 tablets (100 mg total) by mouth daily., Disp: 60 tablet, Rfl: 6   gabapentin (NEURONTIN) 800 MG tablet, Take 1 tablet (800 mg total) by mouth 3 (three) times daily., Disp: 90 tablet, Rfl: 1   lactulose, encephalopathy, (CHRONULAC) 10 GM/15ML SOLN, Take 30 mLs (20 g total) by mouth 2 (two) times daily., Disp: , Rfl:    metolazone (ZAROXOLYN) 2.5 MG tablet, Take 1 tab, 4days a week on Monday, Wednesday  Fridays and Saturdays, Disp: 30 tablet, Rfl: 0   metoprolol succinate (TOPROL XL) 25 MG 24 hr tablet, Take 1 tablet (25 mg total) by mouth at bedtime., Disp: 30 tablet, Rfl: 11   ondansetron (ZOFRAN-ODT) 4 MG disintegrating tablet, Take 4 mg by mouth every 8 (eight) hours as needed for nausea or vomiting (dissolve orally)., Disp: , Rfl:    pantoprazole (PROTONIX) 40 MG tablet, Take 40 mg by mouth 2 (two) times daily before a meal., Disp: , Rfl:    polyethylene glycol (MIRALAX / GLYCOLAX) 17 g packet, Take 17 g by mouth as needed for mild constipation (mix and drink as directed)., Disp: , Rfl:    potassium chloride 20 MEQ/15ML (10%) SOLN, Take 75 mLs (100 mEq total) by mouth 3 (three) times daily. Mondays Take 165ms in the morning by mouth then 77m at lunch and dinner., Disp: 6750 mL, Rfl: 0   pravastatin (PRAVACHOL) 20 MG tablet, Take 1 tablet (20 mg total) by mouth daily., Disp: 30 tablet, Rfl: 3   sertraline (ZOLOFT) 50 MG  tablet, Take 1 tablet (50 mg total) by mouth daily., Disp: 30 tablet, Rfl: 3   tamsulosin (FLOMAX) 0.4 MG CAPS capsule, Take 1 capsule (0.4 mg total) by mouth daily. Additional refills will need to come from PCP, Disp: 30 capsule, Rfl: 0   torsemide (DEMADEX) 100 MG tablet, Take 1 tablet (100 mg total) by mouth See admin instructions. Take 100 mg by mouth in the morning and evening, Disp: 90 tablet, Rfl: 1   triamcinolone (KENALOG) 0.025 % ointment, Apply 1 application. topically 2 (two) times daily. (Patient taking differently: Apply 1 application  topically See admin instructions. Apply to both lower legs/feet 2 times a day), Disp: 30 g, Rfl: 0   VENTOLIN HFA 108 (90 Base) MCG/ACT inhaler, Inhale 1 puff into the lungs every 4 (four) hours as needed for shortness of breath. (Patient taking differently: Inhale 2 puffs into the  lungs every 4 (four) hours as needed for shortness of breath or wheezing.), Disp: 6.7 g, Rfl: 1   zolpidem (AMBIEN) 10 MG tablet, Take 1 tablet (10 mg total) by mouth at bedtime as needed for sleep. (Patient taking differently: Take 10 mg by mouth at bedtime.), Disp: 30 tablet, Rfl: 0 Allergies  Allergen Reactions   Penicillins Other (See Comments)    Caused convulsions as a child  Did it involve swelling of the face/tongue/throat, SOB, or low BP? N/A Did it involve sudden or severe rash/hives, skin peeling, or any reaction on the inside of your mouth or nose? N/A Did you need to seek medical attention at a hospital or doctor's office?N/A When did it last happen? N/A If all above answers are "NO", may proceed with cephalosporin use.     Social History   Socioeconomic History   Marital status: Single    Spouse name: Not on file   Number of children: Not on file   Years of education: Not on file   Highest education level: Not on file  Occupational History   Not on file  Tobacco Use   Smoking status: Never   Smokeless tobacco: Never  Vaping Use   Vaping Use: Never  used  Substance and Sexual Activity   Alcohol use: Not Currently   Drug use: Not Currently    Types: Marijuana    Comment: Smoked for 30 years   Sexual activity: Not on file  Other Topics Concern   Not on file  Social History Narrative   Not on file   Social Determinants of Health   Financial Resource Strain: Medium Risk (11/15/2020)   Overall Financial Resource Strain (CARDIA)    Difficulty of Paying Living Expenses: Somewhat hard  Food Insecurity: No Food Insecurity (03/08/2022)   Hunger Vital Sign    Worried About Running Out of Food in the Last Year: Never true    Ran Out of Food in the Last Year: Never true  Transportation Needs: Unmet Transportation Needs (04/21/2022)   PRAPARE - Hydrologist (Medical): Yes    Lack of Transportation (Non-Medical): Yes  Physical Activity: Not on file  Stress: Not on file  Social Connections: Not on file  Intimate Partner Violence: Not At Risk (03/08/2022)   Humiliation, Afraid, Rape, and Kick questionnaire    Fear of Current or Ex-Partner: No    Emotionally Abused: No    Physically Abused: No    Sexually Abused: No    Physical Exam      Future Appointments  Date Time Provider [place]  05/18/2022  3:15 PM [place] [facility]  06/15/2022  9:00 AM [facility] PA/NP [facility] None

## 2022-05-05 NOTE — Addendum Note (Signed)
Addended by: Shela Nevin R on: 06/06/4973 09:14 AM   Modules accepted: Orders

## 2022-05-07 ENCOUNTER — Telehealth (HOSPITAL_COMMUNITY): Payer: Self-pay

## 2022-05-07 ENCOUNTER — Other Ambulatory Visit (HOSPITAL_COMMUNITY): Payer: Self-pay

## 2022-05-07 NOTE — Telephone Encounter (Signed)
[facility] called me in reference to needing a PA for Liquid Potassium. Please assist. Thank you!  [name], [place] 05/07/2022

## 2022-05-08 NOTE — Telephone Encounter (Signed)
The patient would either need to crush the Potassium and put in applesauce or use the packets per Lauren. We have never been able to get the liquid approved.  Thanks

## 2022-05-11 ENCOUNTER — Other Ambulatory Visit (HOSPITAL_COMMUNITY): Payer: Self-pay

## 2022-05-11 ENCOUNTER — Other Ambulatory Visit (HOSPITAL_COMMUNITY): Payer: Self-pay | Admitting: Pharmacist

## 2022-05-11 DIAGNOSIS — I890 Lymphedema, not elsewhere classified: Secondary | ICD-10-CM | POA: Diagnosis not present

## 2022-05-11 DIAGNOSIS — I872 Venous insufficiency (chronic) (peripheral): Secondary | ICD-10-CM | POA: Diagnosis not present

## 2022-05-11 DIAGNOSIS — I5020 Unspecified systolic (congestive) heart failure: Secondary | ICD-10-CM | POA: Diagnosis not present

## 2022-05-11 DIAGNOSIS — M5126 Other intervertebral disc displacement, lumbar region: Secondary | ICD-10-CM | POA: Diagnosis not present

## 2022-05-11 DIAGNOSIS — I5082 Biventricular heart failure: Secondary | ICD-10-CM | POA: Diagnosis not present

## 2022-05-11 DIAGNOSIS — J449 Chronic obstructive pulmonary disease, unspecified: Secondary | ICD-10-CM | POA: Diagnosis not present

## 2022-05-11 DIAGNOSIS — I480 Paroxysmal atrial fibrillation: Secondary | ICD-10-CM | POA: Diagnosis not present

## 2022-05-11 DIAGNOSIS — I5032 Chronic diastolic (congestive) heart failure: Secondary | ICD-10-CM | POA: Diagnosis not present

## 2022-05-11 DIAGNOSIS — I110 Hypertensive heart disease with heart failure: Secondary | ICD-10-CM | POA: Diagnosis not present

## 2022-05-11 MED ORDER — POTASSIUM CHLORIDE 20 MEQ PO PACK: 100.0000 meq | PACK | Freq: Three times a day (TID) | ORAL | 3 refills | Status: DC

## 2022-05-11 NOTE — Telephone Encounter (Signed)
Packets are ordered by typing "potassium chloride 20 mEq" and selecting the "POTASSIUM CHLORIDE 20 MEQ PO PACK" option. This is only found in the database as it is not a common order. I went ahead and sent the prescription to [facility]. If there is an issue with the prescription or they cannot order, there is stock at [facility] and the prescription can be transferred there.

## 2022-05-12 ENCOUNTER — Telehealth (HOSPITAL_COMMUNITY): Payer: Self-pay | Admitting: Pharmacy Technician

## 2022-05-12 NOTE — Telephone Encounter (Signed)
Patient Advocate Encounter   Received notification from [facility] that prior authorization for Klor-con packets is required.   PA submitted on CoverMyMeds Key [id number] Status is pending   Will continue to follow.

## 2022-05-13 ENCOUNTER — Telehealth (HOSPITAL_COMMUNITY): Payer: Self-pay

## 2022-05-13 ENCOUNTER — Other Ambulatory Visit (HOSPITAL_COMMUNITY): Payer: Self-pay

## 2022-05-13 NOTE — Telephone Encounter (Signed)
Charles Gray contacted me reporting he doesn't have very many Potassium pills left on hand in the home and we are awaiting a PA for the Klor-Con packets. Are we able to send in a small supply of potassium tablets until we learn if he is approved or not for packets to ensure he does not have gaps in taking it?   Thanks!   Call forwarded to Charles Gray.  Charles Gray, [place] 05/13/2022

## 2022-05-14 ENCOUNTER — Other Ambulatory Visit (HOSPITAL_COMMUNITY): Payer: Self-pay | Admitting: *Deleted

## 2022-05-14 ENCOUNTER — Other Ambulatory Visit (HOSPITAL_COMMUNITY): Payer: Self-pay

## 2022-05-14 ENCOUNTER — Telehealth (HOSPITAL_COMMUNITY): Payer: Self-pay

## 2022-05-14 MED ORDER — POTASSIUM CHLORIDE CRYS ER 20 MEQ PO TBCR: 100.0000 meq | EXTENDED_RELEASE_TABLET | Freq: Three times a day (TID) | ORAL | 3 refills | Status: DC

## 2022-05-14 NOTE — Telephone Encounter (Signed)
If he just got a full 30 day supply of the packets, I'm not 100% sure if insurance will go back and pay for the tablets the next day. Can send it and try but I can't guarantee they will.   [employer]

## 2022-05-14 NOTE — Telephone Encounter (Signed)
Advanced Heart Failure Patient Advocate Encounter  Prior Authorization for Klor-con packets has been approved.    PA# [id number] Effective dates: 04/13/22 through 04/13/23  Patients co-pay is $0  [place]

## 2022-05-14 NOTE — Telephone Encounter (Signed)
Received call from Mr. Magowan CMA in the home advising he wants the Potassium tablets now instead of the packets after requesting the packets. She reports he says he tried to use the packets and did not "stomach" them well. I will forward request  to HF triage for same.   [name], [place] 05/14/2022

## 2022-05-14 NOTE — Telephone Encounter (Signed)
His prior authorization for the packets has been approved. He should be able to get it at his pharmacy for $0 now.  Thanks

## 2022-05-18 ENCOUNTER — Ambulatory Visit: Payer: Medicare HMO | Admitting: Podiatry

## 2022-05-19 ENCOUNTER — Other Ambulatory Visit (HOSPITAL_COMMUNITY): Payer: Self-pay

## 2022-05-19 NOTE — Progress Notes (Signed)
Paramedicine Encounter    Patient ID: Charles Gray, male    DOB: [birth year], 67 y.o.   MRN: [id number]   Complaints- general body aches and pains, reports a possible pressure sore on his buttocks, left side pain X2 weeks, lower leg swelling, no shortness of breath or chest pain.   Assessment- A&Ox4, warm and dry, lower leg edema- no leg wraps on, he took them off yesterday will have them reapplied tomorrow. Plans to have PT place compression stockings on today. Lungs clear. Weight down from last visit.   Compliance with meds- missed one metolazone dose in the last week.   Pill box filled for two weeks.   Refills needed metoprolol, metolaozne, inspra.   Meds changes since last visit- NONE     Social changes- NONE    BP 110/72   Pulse 74   Resp 16   Wt (!) 368 lb (166.9 kg)   SpO2 98%   BMI 38.48 kg/m  Weight yesterday-369lbs Last visit weight-370lbs Weight today- 368lbs   Arrived for home visit for [name] who reports to be feeling well with some complaints as listed above, general body aches and pains, left side pain, pressure sore on buttocks. No shortness of breath, or chest pain. No dizziness reported.   Vitals obtained as noted.  Lungs clear.  Lower legs swollen- leg wrapped removed yesterday- will be replaced tomorrow, compression stockings placed by PT today during visit.   I reviewed meds and confirmed same. He received PO potassium and is taking same. He admits to skipping a metolazone in the last week. I educated him on importance of same.   Cardio mems today is 9. Many suspect readings lately- educated on placement of pillow and interference with electronics.   Appointments reviewed and confirmed.   Home visit complete. I will see Charles Gray in two weeks.   Refills: Metoprolol Metolazone [place], [place]  ACTION: Home visit completed    Patient Care Team: Enid Skeens., MD as PCP - General (Family  Medicine) Larey Dresser, MD as PCP - Cardiology (Cardiology) Jorge Ny, LCSW as Social Worker (Licensed Clinical Social Worker)  Patient Active Problem List   Diagnosis Date Noted   Acute on chronic heart failure with preserved ejection fraction (HFpEF) ([place]) 03/04/2022   Dyslipidemia 07/17/2021   Lymphedema 07/08/2021   Melena    Gastric polyps    UGIB (upper gastrointestinal bleed) 04/06/2021   Acute encephalopathy 12/21/2020   COPD (chronic obstructive pulmonary disease) ([place]) 12/21/2020   Chronic diastolic (congestive) heart failure ([place]) 11/14/2020   Syncope 10/28/2020   CHF (congestive heart failure) ([place]) 10/28/2020   Chronic venous stasis dermatitis of both lower extremities 08/27/2020   Cellulitis of both lower extremities 08/14/2020   Iron deficiency anemia 08/14/2020   GERD without esophagitis 06/10/2020   Atrial fibrillation with rapid ventricular response ([place]) 03/24/2020   Acute on chronic right heart failure ([place]) 01/27/2020   Acute respiratory failure with hypoxia (HCC)    Fatty liver disease, nonalcoholic    Shock ([place])    Personal history of DVT (deep vein thrombosis) 12/26/2019   Chronic anticoagulation 12/26/2019   Palpitations 12/26/2019   Paroxysmal atrial fibrillation ([place]) 12/23/2019   Heart failure, systolic, acute ([name]) 93/71/6967   Acute on chronic congestive heart failure (HCC)    Acute on chronic diastolic CHF (congestive heart failure) ([place]) 08/23/2019   Chest pain 08/23/2019   Lactic acidosis 08/23/2019   History of pulmonary embolism 08/12/2019  Atrial tachycardia 08/12/2019   Occasional tremors 08/12/2019   OSA (obstructive sleep apnea) 08/12/2019   AKI (acute kidney injury) ([place]) 08/08/2019   Acute on chronic right-sided heart failure ([place]) 08/06/2019   Acute pulmonary embolism ([place]) 06/08/2019   Normocytic anemia 06/08/2019   Pulmonary emboli ([place]) 06/08/2019   Chronic heart failure with preserved ejection fraction ([place])  05/30/2019   Class 2 severe obesity due to excess calories with serious comorbidity and body mass index (BMI) of 39.0 to 39.9 in adult [facility]) 05/30/2019   Morbid obesity with BMI of 40.0-44.9, adult ([place]) 05/30/2019   Hyponatremia    Drug induced constipation    Peripheral edema    Hypotension due to drugs    Hypoalbuminemia due to protein-calorie malnutrition (HCC)    Thrombocytopenia (HCC)    Anemia of chronic disease    Cirrhosis of liver without ascites (HCC)    Chronic pain syndrome    Debility 03/13/2019   Portal hypertensive gastropathy ([place]) 03/06/2019   Dyspnea    GIB (gastrointestinal bleeding) 03/04/2019   Mixed hyperlipidemia 01/24/2019   Insomnia 01/24/2019   Hyperlipidemia 01/24/2019   Essential hypertension 01/24/2019   Lumbar disc herniation 01/24/2019    Current Outpatient Medications:    apixaban (ELIQUIS) 5 MG TABS tablet, Take 1 tablet (5 mg total) by mouth 2 (two) times daily., Disp: 60 tablet, Rfl: 3   Buprenorphine HCl-Naloxone HCl 8-2 MG FILM, Place 1 Film under the tongue in the morning and at bedtime., Disp: , Rfl:    eplerenone (INSPRA) 50 MG tablet, Take 2 tablets (100 mg total) by mouth daily., Disp: 60 tablet, Rfl: 6   gabapentin (NEURONTIN) 800 MG tablet, Take 1 tablet (800 mg total) by mouth 3 (three) times daily., Disp: 90 tablet, Rfl: 1   lactulose, encephalopathy, (CHRONULAC) 10 GM/15ML SOLN, Take 30 mLs (20 g total) by mouth 2 (two) times daily., Disp: , Rfl:    metolazone (ZAROXOLYN) 2.5 MG tablet, Take 1 tab, 4days a week on Monday, Wednesday  Fridays and Saturdays, Disp: 30 tablet, Rfl: 0   metoprolol succinate (TOPROL XL) 25 MG 24 hr tablet, Take 1 tablet (25 mg total) by mouth at bedtime., Disp: 30 tablet, Rfl: 11   ondansetron (ZOFRAN-ODT) 4 MG disintegrating tablet, Take 4 mg by mouth every 8 (eight) hours as needed for nausea or vomiting (dissolve orally)., Disp: , Rfl:    pantoprazole (PROTONIX) 40 MG tablet, Take 40 mg by mouth 2 (two)  times daily before a meal., Disp: , Rfl:    polyethylene glycol (MIRALAX / GLYCOLAX) 17 g packet, Take 17 g by mouth as needed for mild constipation (mix and drink as directed)., Disp: , Rfl:    potassium chloride (KLOR-CON) 20 MEQ packet, Take 100 mEq by mouth 3 (three) times daily. Take an extra 40 mEq on days when you take metolazone, Disp: 475 packet, Rfl: 3   potassium chloride SA (KLOR-CON M) 20 MEQ tablet, Take 5 tablets (100 mEq total) by mouth 3 (three) times daily. Take an additional 24mq on days you take metolazone., Disp: 475 tablet, Rfl: 3   pravastatin (PRAVACHOL) 20 MG tablet, Take 1 tablet (20 mg total) by mouth daily., Disp: 30 tablet, Rfl: 3   sertraline (ZOLOFT) 50 MG tablet, Take 1 tablet (50 mg total) by mouth daily., Disp: 30 tablet, Rfl: 3   tamsulosin (FLOMAX) 0.4 MG CAPS capsule, Take 1 capsule (0.4 mg total) by mouth daily. Additional refills will need to come from PCP, Disp: 30 capsule,  Rfl: 0   torsemide (DEMADEX) 100 MG tablet, Take 1 tablet (100 mg total) by mouth See admin instructions. Take 100 mg by mouth in the morning and evening, Disp: 90 tablet, Rfl: 1   triamcinolone (KENALOG) 0.025 % ointment, Apply 1 application. topically 2 (two) times daily., Disp: 30 g, Rfl: 0   zolpidem (AMBIEN) 10 MG tablet, Take 1 tablet (10 mg total) by mouth at bedtime as needed for sleep., Disp: 30 tablet, Rfl: 0   VENTOLIN HFA 108 (90 Base) MCG/ACT inhaler, Inhale 1 puff into the lungs every 4 (four) hours as needed for shortness of breath. (Patient taking differently: Inhale 2 puffs into the lungs every 4 (four) hours as needed for shortness of breath.), Disp: 6.7 g, Rfl: 1 Allergies  Allergen Reactions   Penicillins Other (See Comments)    Caused convulsions as a child  Did it involve swelling of the face/tongue/throat, SOB, or low BP? N/A Did it involve sudden or severe rash/hives, skin peeling, or any reaction on the inside of your mouth or nose? N/A Did you need to seek medical  attention at a hospital or doctor's office?N/A When did it last happen? N/A If all above answers are "NO", may proceed with cephalosporin use.     Social History   Socioeconomic History   Marital status: Single    Spouse name: Not on file   Number of children: Not on file   Years of education: Not on file   Highest education level: Not on file  Occupational History   Not on file  Tobacco Use   Smoking status: Never   Smokeless tobacco: Never  Vaping Use   Vaping Use: Never used  Substance and Sexual Activity   Alcohol use: Not Currently   Drug use: Not Currently    Types: Marijuana    Comment: Smoked for 30 years   Sexual activity: Not on file  Other Topics Concern   Not on file  Social History Narrative   Not on file   Social Determinants of Health   Financial Resource Strain: Medium Risk (11/15/2020)   Overall Financial Resource Strain (CARDIA)    Difficulty of Paying Living Expenses: Somewhat hard  Food Insecurity: No Food Insecurity (03/08/2022)   Hunger Vital Sign    Worried About Running Out of Food in the Last Year: Never true    Ran Out of Food in the Last Year: Never true  Transportation Needs: Unmet Transportation Needs (04/21/2022)   PRAPARE - Hydrologist (Medical): Yes    Lack of Transportation (Non-Medical): Yes  Physical Activity: Not on file  Stress: Not on file  Social Connections: Not on file  Intimate Partner Violence: Not At Risk (03/08/2022)   Humiliation, Afraid, Rape, and Kick questionnaire    Fear of Current or Ex-Partner: No    Emotionally Abused: No    Physically Abused: No    Sexually Abused: No    Physical Exam      Future Appointments  Date Time Provider [place]  06/01/2022  2:45 PM [place] [facility]  06/15/2022  9:00 AM [facility] PA/NP [facility] None

## 2022-05-26 ENCOUNTER — Telehealth (HOSPITAL_COMMUNITY): Payer: Self-pay

## 2022-05-26 DIAGNOSIS — F1120 Opioid dependence, uncomplicated: Secondary | ICD-10-CM | POA: Diagnosis not present

## 2022-05-26 DIAGNOSIS — F411 Generalized anxiety disorder: Secondary | ICD-10-CM | POA: Diagnosis not present

## 2022-05-26 DIAGNOSIS — F332 Major depressive disorder, recurrent severe without psychotic features: Secondary | ICD-10-CM | POA: Diagnosis not present

## 2022-05-26 NOTE — Telephone Encounter (Signed)
Call received by [facility] reporting he is feeling very weak and dizzy. He reports his weight is down to 359lbs which is 10lbs less than last week. He has also been having low cardio mems readings- yesterday was 4. His goal is 9. He feels he is taking too much metolazone. (He takes 2.43m M,W,F, Sa)  He denied vomiting or diarrhea. He says he is drinking 68 ounces of fluid daily. He was seen in the home last week with normo-tensive vitals.  I will forward to triage for further.    [name] [place]2/13/2024

## 2022-05-26 NOTE — Telephone Encounter (Signed)
Cardio Mems completed and is reading 3.   [place] to triage CMA and NP.   [name], [place] 05/26/2022

## 2022-05-26 NOTE — Telephone Encounter (Signed)
Made in error

## 2022-06-01 ENCOUNTER — Ambulatory Visit: Payer: Medicare HMO | Admitting: Podiatry

## 2022-06-02 ENCOUNTER — Other Ambulatory Visit (HOSPITAL_COMMUNITY): Payer: Self-pay

## 2022-06-02 ENCOUNTER — Telehealth (HOSPITAL_COMMUNITY): Payer: Self-pay

## 2022-06-02 NOTE — Progress Notes (Signed)
Paramedicine Encounter    Patient ID: Charles Gray, male    DOB: Jun 08, 1955, 67 y.o.   MRN: [id number]   Complaints- fatigue, lower leg pain, weight gain, shortness of breath while walking.   Assessment- Lower leg pain, swelling, redness to legs, increased weight, weight yesterday 375lbs weight today 377lbs. He admits to skipping metolazone on Saturday and Monday. He states that his stools are now loose where as earlier last week he was having constipation. Cardio Mems reading today is 7. Lungs clear.   Compliance with meds- skipped Saturday and Monday Metolazone.   Pill box filled- for two weeks.   Refills needed- Torsemide, Metolazone   Meds changes since last visit-  held [facility] on 2/14 per Allena Katz, NP. Was to have labs drawn on Friday but did not go.    Social changes- NONE   Arrived for home visit for [name] who reports to be feeling "not well" today. He reports feeling fatigued, short of breath while walking, having painful swollen and red lower legs. He says he skipped metolazone Saturday and Monday this past week and is noticing his weight is increasing and swelling is worsened. He says he struggles finding a happy medium with taking the metolazone because he says when he takes it all 4 doses regularly he gets too dehydrated and when he skips doses he gets fluid overloaded. I advised him when taking all four doses he needs to be sure he is increasing his PO intake. He verbalized understanding and says he drinks a lot of water normally.   He did not call to schedule labs and is asking if he could just get them done on his clinic visit on 3/4. I will message triage for same.   [name] was reminded the importance of med compliance and how he should adhere to diet and medication regimen.   I reviewed meds and confirmed same filling pill box for two weeks- educating him on medications as we go.   He is requesting [facility] assistance with transportation for 3/4 I will reach out to  [facility] to facilitate same.   PT and RN for leg wraps are due to come out today for Mr. Ruzich.   I advised him to continue to weigh daily and submit cardiomems as well. He agreed.  We reviewed upcoming appointments. Home visit complete. I will see [name] in A[facility] clinic on 3/4.      BP 128/70   Pulse 78   Resp 16   Wt (!) 377 lb (171 kg)   SpO2 98%   BMI 39.42 kg/m  Weight yesterday-375lbs Last visit weight-377lbs   [name], [place]  ACTION: Home visit completed    Patient Care Team: Enid Skeens., MD as PCP - General (Family Medicine) Larey Dresser, MD as PCP - Cardiology (Cardiology) Jorge Ny, LCSW as Social Worker (Licensed Clinical Social Worker)  Patient Active Problem List   Diagnosis Date Noted   Acute on chronic heart failure with preserved ejection fraction (HFpEF) ([place]) 03/04/2022   Dyslipidemia 07/17/2021   Lymphedema 07/08/2021   Melena    Gastric polyps    UGIB (upper gastrointestinal bleed) 04/06/2021   Acute encephalopathy 12/21/2020   COPD (chronic obstructive pulmonary disease) ([place]) 12/21/2020   Chronic diastolic (congestive) heart failure ([place]) 11/14/2020   Syncope 10/28/2020   CHF (congestive heart failure) ([place]) 10/28/2020   Chronic venous stasis dermatitis of both lower extremities 08/27/2020   Cellulitis of both lower extremities 08/14/2020   Iron deficiency  anemia 08/14/2020   GERD without esophagitis 06/10/2020   Atrial fibrillation with rapid ventricular response ([place]) 03/24/2020   Acute on chronic right heart failure ([place]) 01/27/2020   Acute respiratory failure with hypoxia (HCC)    Fatty liver disease, nonalcoholic    Shock ([place])    Personal history of DVT (deep vein thrombosis) 12/26/2019   Chronic anticoagulation 12/26/2019   Palpitations 12/26/2019   Paroxysmal atrial fibrillation ([place]) 12/23/2019   Heart failure, systolic, acute ([place]) [id number]   Acute on chronic congestive heart  failure (HCC)    Acute on chronic diastolic CHF (congestive heart failure) ([place]) 08/23/2019   Chest pain 08/23/2019   Lactic acidosis 08/23/2019   History of pulmonary embolism 08/12/2019   Atrial tachycardia 08/12/2019   Occasional tremors 08/12/2019   OSA (obstructive sleep apnea) 08/12/2019   AKI (acute kidney injury) ([place]) 08/08/2019   Acute on chronic right-sided heart failure ([place]) 08/06/2019   Acute pulmonary embolism ([place]) 06/08/2019   Normocytic anemia 06/08/2019   Pulmonary emboli ([place]) 06/08/2019   Chronic heart failure with preserved ejection fraction ([place]) 05/30/2019   Class 2 severe obesity due to excess calories with serious comorbidity and body mass index (BMI) of 39.0 to 39.9 in adult ([place]) 05/30/2019   Morbid obesity with BMI of 40.0-44.9, adult ([place]) 05/30/2019   Hyponatremia    Drug induced constipation    Peripheral edema    Hypotension due to drugs    Hypoalbuminemia due to protein-calorie malnutrition (HCC)    Thrombocytopenia (HCC)    Anemia of chronic disease    Cirrhosis of liver without ascites ([place])    Chronic pain syndrome    Debility 03/13/2019   Portal hypertensive gastropathy ([place]) 03/06/2019   Dyspnea    GIB (gastrointestinal bleeding) 03/04/2019   Mixed hyperlipidemia 01/24/2019   Insomnia 01/24/2019   Hyperlipidemia 01/24/2019   Essential hypertension 01/24/2019   Lumbar disc herniation 01/24/2019    Current Outpatient Medications:    apixaban (ELIQUIS) 5 MG TABS tablet, Take 1 tablet (5 mg total) by mouth 2 (two) times daily., Disp: 60 tablet, Rfl: 3   Buprenorphine HCl-Naloxone HCl 8-2 MG FILM, Place 1 Film under the tongue in the morning and at bedtime., Disp: , Rfl:    eplerenone (INSPRA) 50 MG tablet, Take 2 tablets (100 mg total) by mouth daily., Disp: 60 tablet, Rfl: 6   gabapentin (NEURONTIN) 800 MG tablet, Take 1 tablet (800 mg total) by mouth 3 (three) times daily., Disp: 90 tablet, Rfl: 1   lactulose, encephalopathy, (CHRONULAC)  10 GM/15ML SOLN, Take 30 mLs (20 g total) by mouth 2 (two) times daily., Disp: , Rfl:    metolazone (ZAROXOLYN) 2.5 MG tablet, Take 1 tab, 4days a week on Monday, Wednesday  Fridays and Saturdays, Disp: 30 tablet, Rfl: 0   metoprolol succinate (TOPROL XL) 25 MG 24 hr tablet, Take 1 tablet (25 mg total) by mouth at bedtime., Disp: 30 tablet, Rfl: 11   ondansetron (ZOFRAN-ODT) 4 MG disintegrating tablet, Take 4 mg by mouth every 8 (eight) hours as needed for nausea or vomiting (dissolve orally)., Disp: , Rfl:    pantoprazole (PROTONIX) 40 MG tablet, Take 40 mg by mouth 2 (two) times daily before a meal., Disp: , Rfl:    polyethylene glycol (MIRALAX / GLYCOLAX) 17 g packet, Take 17 g by mouth as needed for mild constipation (mix and drink as directed)., Disp: , Rfl:    potassium chloride (KLOR-CON) 20 MEQ packet, Take 100 mEq by mouth 3 (three)  times daily. Take an extra 40 mEq on days when you take metolazone, Disp: 475 packet, Rfl: 3   potassium chloride SA (KLOR-CON M) 20 MEQ tablet, Take 5 tablets (100 mEq total) by mouth 3 (three) times daily. Take an additional 36mq on days you take metolazone., Disp: 475 tablet, Rfl: 3   pravastatin (PRAVACHOL) 20 MG tablet, Take 1 tablet (20 mg total) by mouth daily., Disp: 30 tablet, Rfl: 3   sertraline (ZOLOFT) 50 MG tablet, Take 1 tablet (50 mg total) by mouth daily., Disp: 30 tablet, Rfl: 3   tamsulosin (FLOMAX) 0.4 MG CAPS capsule, Take 1 capsule (0.4 mg total) by mouth daily. Additional refills will need to come from PCP, Disp: 30 capsule, Rfl: 0   torsemide (DEMADEX) 100 MG tablet, Take 1 tablet (100 mg total) by mouth See admin instructions. Take 100 mg by mouth in the morning and evening, Disp: 90 tablet, Rfl: 1   triamcinolone (KENALOG) 0.025 % ointment, Apply 1 application. topically 2 (two) times daily., Disp: 30 g, Rfl: 0   VENTOLIN HFA 108 (90 Base) MCG/ACT inhaler, Inhale 1 puff into the lungs every 4 (four) hours as needed for shortness of breath.  (Patient taking differently: Inhale 2 puffs into the lungs every 4 (four) hours as needed for shortness of breath.), Disp: 6.7 g, Rfl: 1   zolpidem (AMBIEN) 10 MG tablet, Take 1 tablet (10 mg total) by mouth at bedtime as needed for sleep., Disp: 30 tablet, Rfl: 0 Allergies  Allergen Reactions   Penicillins Other (See Comments)    Caused convulsions as a child  Did it involve swelling of the face/tongue/throat, SOB, or low BP? N/A Did it involve sudden or severe rash/hives, skin peeling, or any reaction on the inside of your mouth or nose? N/A Did you need to seek medical attention at a hospital or doctor's office?N/A When did it last happen? N/A If all above answers are "NO", may proceed with cephalosporin use.     Social History   Socioeconomic History   Marital status: Single    Spouse name: Not on file   Number of children: Not on file   Years of education: Not on file   Highest education level: Not on file  Occupational History   Not on file  Tobacco Use   Smoking status: Never   Smokeless tobacco: Never  Vaping Use   Vaping Use: Never used  Substance and Sexual Activity   Alcohol use: Not Currently   Drug use: Not Currently    Types: Marijuana    Comment: Smoked for 30 years   Sexual activity: Not on file  Other Topics Concern   Not on file  Social History Narrative   Not on file   Social Determinants of Health   Financial Resource Strain: Medium Risk (11/15/2020)   Overall Financial Resource Strain (CARDIA)    Difficulty of Paying Living Expenses: Somewhat hard  Food Insecurity: No Food Insecurity (03/08/2022)   Hunger Vital Sign    Worried About Running Out of Food in the Last Year: Never true    Ran Out of Food in the Last Year: Never true  Transportation Needs: Unmet Transportation Needs (04/21/2022)   PRAPARE - THydrologist(Medical): Yes    Lack of Transportation (Non-Medical): Yes  Physical Activity: Not on file  Stress: Not  on file  Social Connections: Not on file  Intimate Partner Violence: Not At Risk (03/08/2022)   Humiliation, Afraid, Rape, and  Kick questionnaire    Fear of Current or Ex-Partner: No    Emotionally Abused: No    Physically Abused: No    Sexually Abused: No    Physical Exam      Future Appointments  Date Time Provider [place]  06/15/2022  9:00 AM [facility] PA/NP [facility] None

## 2022-06-02 NOTE — Telephone Encounter (Signed)
Mr. Aragon is requesting [facility] assistance for setting up his transportation on 3/4 to clinic. Forwarding to [employer] for same.   [name], [place] 06/02/2022

## 2022-06-02 NOTE — Telephone Encounter (Signed)
[name] was to schedule a BMET for 2/16 but did not and is asking if he can have his labs drawn on 3/4 when he comes in for his APP clinic visit. He was seen in the home today. Last week he was dehydrated, was instructed to skip Wednesday 2/14 metolazone and resume on 2/16 but he skipped 2/14, 2/16 and 2/19. Today his weight is up and legs are swollen. Cardio Mems is 7- Goal is 9. Please advise on lab timing if he can wait for 3/4 or he needs to have them done sooner. Thanks.   [name], [place] 06/02/2022

## 2022-06-03 ENCOUNTER — Telehealth (HOSPITAL_COMMUNITY): Payer: Self-pay | Admitting: Licensed Clinical Social Worker

## 2022-06-03 ENCOUNTER — Telehealth: Payer: Self-pay | Admitting: *Deleted

## 2022-06-03 DIAGNOSIS — I5032 Chronic diastolic (congestive) heart failure: Secondary | ICD-10-CM

## 2022-06-03 NOTE — Telephone Encounter (Signed)
   Telephone encounter was:  Unsuccessful.  06/03/2022 Name: Raun Keil MRN: [id number] DOB: 12-Aug-1955  Unsuccessful outbound call made today to assist with:  Transportation Needs   Outreach Attempt:  1st Attempt  A HIPAA compliant voice message was left requesting a return call.  Instructed patient to call back at [phone].  [place] [phone] [address] , [place] [address] Email : [email or web address]

## 2022-06-03 NOTE — Telephone Encounter (Signed)
   Telephone encounter was:  Successful.  06/03/2022 Name: Charles Gray MRN: [id number] DOB: 27-May-1955  Charles Gray is a 67 y.o. year old male who is a primary care patient of [place], Marshall Cork., MD . The community resource team was consulted for assistance with Transportation Needs   Care guide performed the following interventions: Follow up call placed to the patient to discuss status of referral. Patient needed transport to c heart and vascular on 06/05/2022 booked with Thn transportan let patient know he needs to be using  [facility] transport for these visits booked this one as was short notice  Follow Up Plan:  No further follow up planned at this time. The patient has been provided with needed resources.  [place] [phone] [address] , [place] [address] Email : [email or web address]

## 2022-06-03 NOTE — Telephone Encounter (Signed)
F/u labs 2/23 @ 245 Reports he will do the best he can with increased BLE and decreased mobility

## 2022-06-03 NOTE — Telephone Encounter (Signed)
It is probally best that he have labs repeated sooner rather than later with recent medication changes, will send to provider to confirm. We can assist with transportation if needed or arrange at lab facility closer if this helps.

## 2022-06-03 NOTE — Telephone Encounter (Signed)
H&V Care Navigation CSW Progress Note  Clinical Social Worker informed by [employer] that pt is needing help from [facility] with setting up ride to upcoming clinic appt.  CSW placed order to [facility] to assist with transportation.   SDOH Screenings   Food Insecurity: No Food Insecurity (03/08/2022)  Housing: Low Risk  (03/08/2022)  Transportation Needs: Unmet Transportation Needs (04/21/2022)  Utilities: Not At Risk (03/08/2022)  Alcohol Screen: Low Risk  (06/11/2020)  Depression (PHQ2-9): Medium Risk (06/11/2020)  Financial Resource Strain: Medium Risk (11/15/2020)  Tobacco Use: Low Risk  (04/15/2022)   Jorge Ny, LCSW Clinical Social Worker [facility] Desk#: [phone] Cell#: [phone]

## 2022-06-05 ENCOUNTER — Ambulatory Visit (HOSPITAL_COMMUNITY)
Admission: RE | Admit: 2022-06-05 | Discharge: 2022-06-05 | Disposition: A | Discharge status: Home / Self Care | Payer: Medicare HMO | Source: Ambulatory Visit | Attending: Internal Medicine | Admitting: Internal Medicine

## 2022-06-05 DIAGNOSIS — I5032 Chronic diastolic (congestive) heart failure: Secondary | ICD-10-CM | POA: Diagnosis not present

## 2022-06-05 LAB — BASIC METABOLIC PANEL
Anion gap: 10 (ref 5–15)
BUN: 15 mg/dL (ref 8–23)
CO2: 32 mmol/L (ref 22–32)
Calcium: 9.1 mg/dL (ref 8.9–10.3)
Chloride: 91 mmol/L — ABNORMAL LOW (ref 98–111)
Creatinine, Ser: 1.05 mg/dL (ref 0.61–1.24)
GFR, Estimated: >60 mL/min (ref >60)
Glucose, Bld: 129 mg/dL — ABNORMAL HIGH (ref 70–99)
Potassium: 2.9 mmol/L — ABNORMAL LOW (ref 3.5–5.1)
Sodium: 133 mmol/L — ABNORMAL LOW (ref 135–145)

## 2022-06-08 DIAGNOSIS — L03115 Cellulitis of right lower limb: Secondary | ICD-10-CM | POA: Diagnosis not present

## 2022-06-08 DIAGNOSIS — Z79899 Other long term (current) drug therapy: Secondary | ICD-10-CM | POA: Diagnosis not present

## 2022-06-09 ENCOUNTER — Telehealth (HOSPITAL_COMMUNITY): Payer: Self-pay

## 2022-06-09 NOTE — Telephone Encounter (Signed)
Gamaliel reached out reporting he was seen by PCP yesterday for possible cellulitis and says his provider started him on Doxycycline '100mg'$  1 capsule BID for 10 days. He reports bilateral leg swelling, redness, weeping with severe soreness and tenderness. PCP believes this is coming from getting his legs wrapped too tightly. PCP ordered leg wraps twice weekly. PCP also obtained labs yesterday but he doesn't have the results yet. He reports his weight was 375lbs yesterday. He denied missing any of his metolazone doses and says he has not missed any Potassium. He also reports he feels like he is back in afib because his pulse oximeter is reading between 50-100 this is at rest. He states did not report this to PCP yesterday. He denied any chest pain and denied any dizziness. He knows to call EMS if symptoms worsen or persist. He verbalized understanding. He will be seen in HF clinic on Monday and I will follow up tomorrow.   [name], [place] 06/09/2022

## 2022-06-10 DIAGNOSIS — I890 Lymphedema, not elsewhere classified: Secondary | ICD-10-CM | POA: Diagnosis not present

## 2022-06-10 DIAGNOSIS — I5032 Chronic diastolic (congestive) heart failure: Secondary | ICD-10-CM | POA: Diagnosis not present

## 2022-06-10 DIAGNOSIS — I5020 Unspecified systolic (congestive) heart failure: Secondary | ICD-10-CM | POA: Diagnosis not present

## 2022-06-10 DIAGNOSIS — I480 Paroxysmal atrial fibrillation: Secondary | ICD-10-CM | POA: Diagnosis not present

## 2022-06-10 DIAGNOSIS — I5082 Biventricular heart failure: Secondary | ICD-10-CM | POA: Diagnosis not present

## 2022-06-10 DIAGNOSIS — J449 Chronic obstructive pulmonary disease, unspecified: Secondary | ICD-10-CM | POA: Diagnosis not present

## 2022-06-10 DIAGNOSIS — M5126 Other intervertebral disc displacement, lumbar region: Secondary | ICD-10-CM | POA: Diagnosis not present

## 2022-06-10 DIAGNOSIS — I872 Venous insufficiency (chronic) (peripheral): Secondary | ICD-10-CM | POA: Diagnosis not present

## 2022-06-10 DIAGNOSIS — I110 Hypertensive heart disease with heart failure: Secondary | ICD-10-CM | POA: Diagnosis not present

## 2022-06-15 ENCOUNTER — Encounter (HOSPITAL_COMMUNITY): Payer: Medicare HMO

## 2022-06-15 ENCOUNTER — Telehealth (HOSPITAL_COMMUNITY): Payer: Self-pay

## 2022-06-15 NOTE — Telephone Encounter (Signed)
Charles Gray called me informing me he tried to cancel his appointment for today- due to weakness.  I informed him to call the clinic number and cancel and reschedule. I will work him in for a home visit this week. Call complete.   [name], [place] 06/15/2022

## 2022-06-17 DIAGNOSIS — F332 Major depressive disorder, recurrent severe without psychotic features: Secondary | ICD-10-CM | POA: Diagnosis not present

## 2022-06-17 DIAGNOSIS — F1120 Opioid dependence, uncomplicated: Secondary | ICD-10-CM | POA: Diagnosis not present

## 2022-06-17 DIAGNOSIS — F411 Generalized anxiety disorder: Secondary | ICD-10-CM | POA: Diagnosis not present

## 2022-06-18 ENCOUNTER — Other Ambulatory Visit (HOSPITAL_COMMUNITY): Payer: Self-pay

## 2022-06-18 NOTE — Progress Notes (Signed)
Paramedicine Encounter    Patient ID: Charles Gray, male    DOB: [birth year], 67 y.o.   MRN: [id number]   Complaints- neck, leg and stomach pains.   Assessment- CAOX4, warm and dry, seated in his recliner, having neck muscle pain, constipation with abdominal pain and lower leg pain which is all chronic for him. Both lower legs wrapped by RN Tuesday. Lungs clear.   Compliance with meds- three missed doses of gabapentin at noon. Reports no missed doses of metolazone.   Pill box filled- two weeks   Refills needed- metolazone, metoprolol, potassium   Meds changes since last visit- none     Social changes- none    BP 110/68   Pulse 88   Resp 18   Wt (!) 359 lb (162.8 kg)   SpO2 96%   BMI 37.54 kg/m  Weight yesterday-didn't weigh  Last visit weight-377lbs   CARDIO MEMS 9 TODAY. GOAL IS 9.   Arrived for home visit for Charles Gray who reports to be having a rough day. He says he doesn't feel well- he complains of neck pain, constipation with abdominal pain and lower leg pain. He was asked if he had used any Tylenol for pain and he declined. Vitals were obtained as noted. Lungs clear. Both lower legs wrapped by RN on Tuesday- they will return tomorrow to re-wrap. He was given doxycycline by PCP two weeks ago for suspected cellulitis - he completed same. He states he is unsure if it helped or not. I reviewed meds and filled pill boxes for two weeks. We reviewed refills and upcoming appointments. We discussed social and mental health. He reports feeling more down and depressed lately due to some family medical concerns he did not want to share. I provided comfort conversation and advised him if he needed some more guidance to let me know. He agreed with plan. Education on diet, exercise and medication compliance discussed. Home visit complete. I will see Charles Gray in two weeks in the clinic.    [name], [place]  ACTION: Home visit completed    Patient Care  Team: Enid Skeens., MD as PCP - General (Family Medicine) Larey Dresser, MD as PCP - Cardiology (Cardiology) Jorge Ny, LCSW as Social Worker (Licensed Clinical Social Worker)  Patient Active Problem List   Diagnosis Date Noted   Acute on chronic heart failure with preserved ejection fraction (HFpEF) ([place]) 03/04/2022   Dyslipidemia 07/17/2021   Lymphedema 07/08/2021   [place]ena    Gastric polyps    UGIB (upper gastrointestinal bleed) 04/06/2021   Acute encephalopathy 12/21/2020   COPD (chronic obstructive pulmonary disease) ([place]) 12/21/2020   Chronic diastolic (congestive) heart failure ([place]) 11/14/2020   Syncope 10/28/2020   CHF (congestive heart failure) ([place]) 10/28/2020   Chronic venous stasis dermatitis of both lower extremities 08/27/2020   Cellulitis of both lower extremities 08/14/2020   Iron deficiency anemia 08/14/2020   GERD without esophagitis 06/10/2020   Atrial fibrillation with rapid ventricular response ([place]) 03/24/2020   Acute on chronic right heart failure ([place]) 01/27/2020   Acute respiratory failure with hypoxia (HCC)    Fatty liver disease, nonalcoholic    Shock ([place])    Personal history of DVT (deep vein thrombosis) 12/26/2019   Chronic anticoagulation 12/26/2019   Palpitations 12/26/2019   Paroxysmal atrial fibrillation ([place]) 12/23/2019   Heart failure, systolic, acute ([place]) [id number]   Acute on chronic congestive heart failure (HCC)    Acute on chronic diastolic CHF (congestive heart failure) ([place])  08/23/2019   Chest pain 08/23/2019   Lactic acidosis 08/23/2019   History of pulmonary embolism 08/12/2019   Atrial tachycardia 08/12/2019   Occasional tremors 08/12/2019   OSA (obstructive sleep apnea) 08/12/2019   AKI (acute kidney injury) ([place]) 08/08/2019   Acute on chronic right-sided heart failure ([place]) 08/06/2019   Acute pulmonary embolism ([place]) 06/08/2019   Normocytic anemia 06/08/2019   Pulmonary emboli ([place]) 06/08/2019   Chronic heart  failure with preserved ejection fraction ([place]) 05/30/2019   Class 2 severe obesity due to excess calories with serious comorbidity and body mass index (BMI) of 39.0 to 39.9 in adult ([place]) 05/30/2019   Morbid obesity with BMI of 40.0-44.9, adult ([place]) 05/30/2019   Hyponatremia    Drug induced constipation    Peripheral edema    Hypotension due to drugs    Hypoalbuminemia due to protein-calorie malnutrition (HCC)    Thrombocytopenia (HCC)    Anemia of chronic disease    Cirrhosis of liver without ascites ([place])    Chronic pain syndrome    Debility 03/13/2019   Portal hypertensive gastropathy ([place]) 03/06/2019   Dyspnea    GIB (gastrointestinal bleeding) 03/04/2019   Mixed hyperlipidemia 01/24/2019   Insomnia 01/24/2019   Hyperlipidemia 01/24/2019   Essential hypertension 01/24/2019   Lumbar disc herniation 01/24/2019    Current Outpatient Medications:    apixaban (ELIQUIS) 5 MG TABS tablet, Take 1 tablet (5 mg total) by mouth 2 (two) times daily., Disp: 60 tablet, Rfl: 3   Buprenorphine HCl-Naloxone HCl 8-2 MG FILM, Place 1 Film under the tongue in the morning and at bedtime., Disp: , Rfl:    eplerenone (INSPRA) 50 MG tablet, Take 2 tablets (100 mg total) by mouth daily., Disp: 60 tablet, Rfl: 6   gabapentin (NEURONTIN) 800 MG tablet, Take 1 tablet (800 mg total) by mouth 3 (three) times daily., Disp: 90 tablet, Rfl: 1   lactulose, encephalopathy, (CHRONULAC) 10 GM/15ML SOLN, Take 30 mLs (20 g total) by mouth 2 (two) times daily., Disp: , Rfl:    metolazone (ZAROXOLYN) 2.5 MG tablet, Take 1 tab, 4days a week on Monday, Wednesday  Fridays and Saturdays, Disp: 30 tablet, Rfl: 0   metoprolol succinate (TOPROL XL) 25 MG 24 hr tablet, Take 1 tablet (25 mg total) by mouth at bedtime., Disp: 30 tablet, Rfl: 11   ondansetron (ZOFRAN-ODT) 4 MG disintegrating tablet, Take 4 mg by mouth every 8 (eight) hours as needed for nausea or vomiting (dissolve orally)., Disp: , Rfl:    pantoprazole (PROTONIX)  40 MG tablet, Take 40 mg by mouth 2 (two) times daily before a meal., Disp: , Rfl:    polyethylene glycol (MIRALAX / GLYCOLAX) 17 g packet, Take 17 g by mouth as needed for mild constipation (mix and drink as directed)., Disp: , Rfl:    potassium chloride (KLOR-CON) 20 MEQ packet, Take 100 mEq by mouth 3 (three) times daily. Take an extra 40 mEq on days when you take metolazone, Disp: 475 packet, Rfl: 3   potassium chloride SA (KLOR-CON M) 20 MEQ tablet, Take 5 tablets (100 mEq total) by mouth 3 (three) times daily. Take an additional 49mq on days you take metolazone., Disp: 475 tablet, Rfl: 3   pravastatin (PRAVACHOL) 20 MG tablet, Take 1 tablet (20 mg total) by mouth daily., Disp: 30 tablet, Rfl: 3   sertraline (ZOLOFT) 50 MG tablet, Take 1 tablet (50 mg total) by mouth daily., Disp: 30 tablet, Rfl: 3   tamsulosin (FLOMAX) 0.4 MG CAPS capsule,  Take 1 capsule (0.4 mg total) by mouth daily. Additional refills will need to come from PCP, Disp: 30 capsule, Rfl: 0   torsemide (DEMADEX) 100 MG tablet, Take 1 tablet (100 mg total) by mouth See admin instructions. Take 100 mg by mouth in the morning and evening, Disp: 90 tablet, Rfl: 1   triamcinolone (KENALOG) 0.025 % ointment, Apply 1 application. topically 2 (two) times daily., Disp: 30 g, Rfl: 0   VENTOLIN HFA 108 (90 Base) MCG/ACT inhaler, Inhale 1 puff into the lungs every 4 (four) hours as needed for shortness of breath. (Patient taking differently: Inhale 2 puffs into the lungs every 4 (four) hours as needed for shortness of breath.), Disp: 6.7 g, Rfl: 1   zolpidem (AMBIEN) 10 MG tablet, Take 1 tablet (10 mg total) by mouth at bedtime as needed for sleep., Disp: 30 tablet, Rfl: 0 Allergies  Allergen Reactions   Penicillins Other (See Comments)    Caused convulsions as a child  Did it involve swelling of the face/tongue/throat, SOB, or low BP? N/A Did it involve sudden or severe rash/hives, skin peeling, or any reaction on the inside of your mouth  or nose? N/A Did you need to seek medical attention at a hospital or doctor's office?N/A When did it last happen? N/A If all above answers are "NO", may proceed with cephalosporin use.     Social History   Socioeconomic History   Marital status: Single    Spouse name: Not on file   Number of children: Not on file   Years of education: Not on file   Highest education level: Not on file  Occupational History   Not on file  Tobacco Use   Smoking status: Never   Smokeless tobacco: Never  Vaping Use   Vaping Use: Never used  Substance and Sexual Activity   Alcohol use: Not Currently   Drug use: Not Currently    Types: Marijuana    Comment: Smoked for 30 years   Sexual activity: Not on file  Other Topics Concern   Not on file  Social History Narrative   Not on file   Social Determinants of Health   Financial Resource Strain: Medium Risk (11/15/2020)   Overall Financial Resource Strain (CARDIA)    Difficulty of Paying Living Expenses: Somewhat hard  Food Insecurity: No Food Insecurity (03/08/2022)   Hunger Vital Sign    Worried About Running Out of Food in the Last Year: Never true    Ran Out of Food in the Last Year: Never true  Transportation Needs: Unmet Transportation Needs (04/21/2022)   PRAPARE - Hydrologist (Medical): Yes    Lack of Transportation (Non-Medical): Yes  Physical Activity: Not on file  Stress: Not on file  Social Connections: Not on file  Intimate Partner Violence: Not At Risk (03/08/2022)   Humiliation, Afraid, Rape, and Kick questionnaire    Fear of Current or Ex-Partner: No    Emotionally Abused: No    Physically Abused: No    Sexually Abused: No    Physical Exam      Future Appointments  Date Time Provider [place]  07/01/2022  9:00 AM [facility] PA/NP [facility] None

## 2022-06-22 ENCOUNTER — Other Ambulatory Visit (HOSPITAL_COMMUNITY): Payer: Self-pay | Admitting: Cardiology

## 2022-06-30 ENCOUNTER — Telehealth (HOSPITAL_COMMUNITY): Payer: Self-pay

## 2022-06-30 NOTE — Telephone Encounter (Signed)
Charles Gray reached out about Cardio [place] reading for today noting 7 with a goal of 9. I also reminded him of his upcoming appointment tomorrow morning- I will meet him there and reminded him to bring his medications and pill boxes. He has transportation set up and confirmed same. He agreed with plans. Call complete.   [name], [place] 06/30/2022

## 2022-07-01 ENCOUNTER — Other Ambulatory Visit (HOSPITAL_COMMUNITY): Payer: Self-pay

## 2022-07-01 ENCOUNTER — Encounter (HOSPITAL_COMMUNITY): Payer: Self-pay

## 2022-07-01 ENCOUNTER — Ambulatory Visit (HOSPITAL_COMMUNITY)
Admission: RE | Admit: 2022-07-01 | Discharge: 2022-07-01 | Disposition: A | Discharge status: Home / Self Care | Payer: Medicare HMO | Source: Ambulatory Visit | Attending: Family Medicine | Admitting: Family Medicine

## 2022-07-01 VITALS — BP 120/70 | HR 80 | Wt 374.2 lb

## 2022-07-01 DIAGNOSIS — I110 Hypertensive heart disease with heart failure: Secondary | ICD-10-CM | POA: Diagnosis not present

## 2022-07-01 DIAGNOSIS — I890 Lymphedema, not elsewhere classified: Secondary | ICD-10-CM | POA: Insufficient documentation

## 2022-07-01 DIAGNOSIS — K3189 Other diseases of stomach and duodenum: Secondary | ICD-10-CM | POA: Diagnosis not present

## 2022-07-01 DIAGNOSIS — K766 Portal hypertension: Secondary | ICD-10-CM | POA: Diagnosis not present

## 2022-07-01 DIAGNOSIS — E669 Obesity, unspecified: Secondary | ICD-10-CM | POA: Insufficient documentation

## 2022-07-01 DIAGNOSIS — R0789 Other chest pain: Secondary | ICD-10-CM | POA: Diagnosis not present

## 2022-07-01 DIAGNOSIS — F32A Depression, unspecified: Secondary | ICD-10-CM

## 2022-07-01 DIAGNOSIS — Z86711 Personal history of pulmonary embolism: Secondary | ICD-10-CM

## 2022-07-01 DIAGNOSIS — K7460 Unspecified cirrhosis of liver: Secondary | ICD-10-CM

## 2022-07-01 DIAGNOSIS — R42 Dizziness and giddiness: Secondary | ICD-10-CM | POA: Diagnosis not present

## 2022-07-01 DIAGNOSIS — I10 Essential (primary) hypertension: Secondary | ICD-10-CM

## 2022-07-01 DIAGNOSIS — D509 Iron deficiency anemia, unspecified: Secondary | ICD-10-CM | POA: Insufficient documentation

## 2022-07-01 DIAGNOSIS — Z7901 Long term (current) use of anticoagulants: Secondary | ICD-10-CM | POA: Insufficient documentation

## 2022-07-01 DIAGNOSIS — G4733 Obstructive sleep apnea (adult) (pediatric): Secondary | ICD-10-CM | POA: Diagnosis not present

## 2022-07-01 DIAGNOSIS — Z79899 Other long term (current) drug therapy: Secondary | ICD-10-CM | POA: Insufficient documentation

## 2022-07-01 DIAGNOSIS — Z6839 Body mass index (BMI) 39.0-39.9, adult: Secondary | ICD-10-CM | POA: Insufficient documentation

## 2022-07-01 DIAGNOSIS — I5032 Chronic diastolic (congestive) heart failure: Secondary | ICD-10-CM | POA: Insufficient documentation

## 2022-07-01 DIAGNOSIS — I480 Paroxysmal atrial fibrillation: Secondary | ICD-10-CM | POA: Insufficient documentation

## 2022-07-01 DIAGNOSIS — Z86718 Personal history of other venous thrombosis and embolism: Secondary | ICD-10-CM | POA: Insufficient documentation

## 2022-07-01 DIAGNOSIS — K5909 Other constipation: Secondary | ICD-10-CM | POA: Insufficient documentation

## 2022-07-01 DIAGNOSIS — R6889 Other general symptoms and signs: Secondary | ICD-10-CM | POA: Diagnosis not present

## 2022-07-01 DIAGNOSIS — F419 Anxiety disorder, unspecified: Secondary | ICD-10-CM | POA: Insufficient documentation

## 2022-07-01 DIAGNOSIS — J449 Chronic obstructive pulmonary disease, unspecified: Secondary | ICD-10-CM | POA: Insufficient documentation

## 2022-07-01 DIAGNOSIS — D649 Anemia, unspecified: Secondary | ICD-10-CM | POA: Diagnosis not present

## 2022-07-01 LAB — BASIC METABOLIC PANEL
Anion gap: 9 (ref 5–15)
BUN: 11 mg/dL (ref 8–23)
CO2: 33 mmol/L — ABNORMAL HIGH (ref 22–32)
Calcium: 8.8 mg/dL — ABNORMAL LOW (ref 8.9–10.3)
Chloride: 96 mmol/L — ABNORMAL LOW (ref 98–111)
Creatinine, Ser: 0.91 mg/dL (ref 0.61–1.24)
GFR, Estimated: >60 mL/min (ref >60)
Glucose, Bld: 137 mg/dL — ABNORMAL HIGH (ref 70–99)
Potassium: 3.9 mmol/L (ref 3.5–5.1)
Sodium: 138 mmol/L (ref 135–145)

## 2022-07-01 LAB — CBC
HCT: 30.4 % — ABNORMAL LOW (ref 39.0–52.0)
Hemoglobin: 9 g/dL — ABNORMAL LOW (ref 13.0–17.0)
MCH: 21.3 pg — ABNORMAL LOW (ref 26.0–34.0)
MCHC: 29.6 g/dL — ABNORMAL LOW (ref 30.0–36.0)
MCV: 71.9 fL — ABNORMAL LOW (ref 80.0–100.0)
Platelets: 133 10*3/uL — ABNORMAL LOW (ref 150–400)
RBC: 4.23 MIL/uL (ref 4.22–5.81)
RDW: 17.2 % — ABNORMAL HIGH (ref 11.5–15.5)
WBC: 7.9 10*3/uL (ref 4.0–10.5)
nRBC: 0 % (ref 0.0–0.2)

## 2022-07-01 LAB — IRON AND TIBC
Iron: 22 ug/dL — ABNORMAL LOW (ref 45–182)
Saturation Ratios: 4 % — ABNORMAL LOW (ref 17.9–39.5)
TIBC: 539 ug/dL — ABNORMAL HIGH (ref 250–450)
UIBC: 517 ug/dL

## 2022-07-01 LAB — FERRITIN: Ferritin: 18 ng/mL — ABNORMAL LOW (ref 24–336)

## 2022-07-01 LAB — BRAIN NATRIURETIC PEPTIDE: B Natriuretic Peptide: 104.7 pg/mL — ABNORMAL HIGH (ref 0.0–100.0)

## 2022-07-01 NOTE — Patient Instructions (Signed)
Thank you for coming in today  Labs were done today, if any labs are abnormal the clinic will call you No news is good news  Medications: No changes  Follow up appointments:  Your physician recommends that you schedule a follow-up appointment in:  3 months with Dr. Kendall Flack will receive a reminder letter in the mail a few months in advance. If you don't receive a letter, please call our office to schedule the follow-up appointment.     Do the following things EVERYDAY: Weigh yourself in the morning before breakfast. Write it down and keep it in a log. Take your medicines as prescribed Eat low salt foods--Limit salt (sodium) to 2000 mg per day.  Stay as active as you can everyday Limit all fluids for the day to less than 2 liters   At the [facility], you and your health needs are our priority. As part of our continuing mission to provide you with exceptional heart care, we have created designated Provider Care Teams. These Care Teams include your primary Cardiologist (physician) and Advanced Practice Providers (APPs- Physician Assistants and Nurse Practitioners) who all work together to provide you with the care you need, when you need it.   You may see any of the following providers on your designated Care Team at your next follow up: Dr Glori Bickers Dr Loralie Champagne Dr. Roxana Hires, NP Lyda Jester, [place] [facility], [place] Forestine Na, NP Audry Riles, PharmD   Please be sure to bring in all your medications bottles to every appointment.    Thank you for choosing [facility]  If you have any questions or concerns before your next appointment please send [place] a message through [place] or call our office at [phone].    TO LEAVE A MESSAGE FOR THE NURSE SELECT OPTION 2, PLEASE LEAVE A MESSAGE INCLUDING: YOUR NAME DATE OF BIRTH CALL BACK NUMBER REASON FOR CALL**this is  important as we prioritize the call backs  YOU WILL RECEIVE A CALL BACK THE SAME DAY AS LONG AS YOU CALL BEFORE 4:00 PM

## 2022-07-01 NOTE — Progress Notes (Signed)
ADVANCED HF CLINIC NOTE  PCP: Enid Skeens., MD Primary Cardiologist: Dr. Berniece Salines, DO  HF Cardiology: Dr. [name]  HPI: 67 y.o. 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    He has had multiple hospitalizations in 2021 for CHF exacerbation, requiring IV Lasix and change in PO regimen from lasix to torsemide. Echo 4/21 showed normal LVEF 55-60% w/ G1DD. RV not well visualized, but felt to have component of RV failure. [place] 4/21 showed normal PA pressure and normal cardiac output. PFTs also completed and c/w mild obstructive airway disease.   In 7/21, he had a coronary CTA.  This showed no coronary calcium but there was a concern for soft plaque in the distal LCx and distal RCA with hemodynamically significant stenosis.  He was managed medically. He has not had any chest pain, but was started on ranolazine after his CTA.  He feels like it makes his "head fuzzy."    Admitted with A/C diastolic heart failure 123456. Diuresed with IV lasix and transitioned to bumex 3 mg twice a day. Discharge weight 360 pounds.   He was admitted again in 12/21 with CHF.  Echo in 12/21 showed EF 123456, grade 2 diastolic dysfunction, RV poorly visualized.   Admitted [id number] with A/C diastolic heart failure. Diuresed with IV lasix + metolazone. Transitioned to torsemide 60 mg twice a day and metolazone twice a day. D/C weight 364 pounds.   Admitted 5/28//22 with cellulitis. Treated with antibiotics.   Admitted 09/20/20 with  A/C CHF. He diuresed 5 L with IV lasix, weight 371 at discharge.  He was readmitted for A/C CHF 7/22. He was diuresed with IV lasix + metolazone. RHC showed  optimized filling pressures and preserved CO/CI. He was discharged on torsemide 100 mg bid + metolazone M/W/F. Discharge weight 374 lbs.  RHC (8/22) showed normal filling pressures & he underwent Cardiomems placement.  Admitted  9/10-9/12/22 for acute encephalopathy, felt secondary to polypharmacy. Ambien and gabapentin were briefly held. Ammonia mildly elevated and he was started on lactulose. Mentation returned to baseline and he was discharged home with Home Health PT/OT.  Saw ID for suspected cellulitis. No active infection identified. Referral to lymphedema clinic submitted.   Admitted 3/23 with a/c dHF and worsening LE edema. Echo showed EF 60-65% RV normal this admission. He was diuresed with IV Lasix, down 20 lbs. LE venous dopplers negative DVT. Hospitalization complicated by severe  hypokalemia, discharged home on KCL 100 tid, weight 377.7 lbs. Cardiomems goal lowered to 9.  Instructed to use Furoscix x 3 days with 14 lb weight gain in 1 week, he completed 2 doses (8/24 and 8/25).   Admitted 11/23 with a/c dHF, weight up to 407 lbs, despite normal PAD readings. He was diuresed with IV lasix and metolazone.  Echo showed EF 55-60%, RV not well-visualized. Discharged home, weight 385 lbs.  Today he returns for HF follow up with [facility] with paramedicine. Overall feeling "terrible". He has good and bad days, he feels he has more bad days than good. Joints ache. He has BLE swelling when Unna wraps removed. PCP managing unna boots. He is SOB walking to the bathroom. He is chronically dizzy, no falls. He has rare atypical chest pain when he gets stressed out. Denies palpitations, abnormal bleeding, or PND/Orthopnea. He chronically sleeps in a recliner for the past year. Appetite waxes and wanes. No fever or chills. Weight at home 368 pounds.  Taking all medications. He has 2 Furoscix at home.   Cardiomems: PAD 7 (goal 9).   ECG (personally reviewed): none ordered today.  PMH: 1. HTN 2. OSA 3. Hyperlipidemia 4. Low back pain 5. PE/DVT in 2/21.  - V/Q scan in 6/21 was not suggestive of chronic PE.  6. Nonalcoholic steatohepatitis.  7. GI bleed in 10/20.  8. Anxiety 9. CAD: Coronary CTA in 7/21 with calcium score 0  but concern for soft plaque distal CFX and RCA.  By [facility], there was flow-limiting stenosis in distal CFX and distal RCA.  10. Chronic diastolic CHF: Echo in 123456 with EF 55-60%, RV poorly visualized.  - RHC (4/21): mean RA 8, PA 20/8, mean PCWP 11, CI 2.8 - Echo (12/21): EF 123456, grade 2 diastolic dysfunction, RV poorly visualized.   - RHC (8/22) normal filling pressures, RA mean 7, RV 29/9, PA 32/15, mean 22, PCWP mean 13, CO/CI 7.32/2.41. - Cardiomems (8/22) placement. - Echo (3/23): EF 60-65%, mild LVH, normal RV 11. PFTs (4/21): Mild obstruction.  12. ABIs (10/21): Normal.  Review of Systems: All systems reviewed and negative except as per HPI.   Current Outpatient Medications  Medication Sig Dispense Refill   apixaban (ELIQUIS) 5 MG TABS tablet Take 1 tablet (5 mg total) by mouth 2 (two) times daily. 60 tablet 3   Buprenorphine HCl-Naloxone HCl 8-2 MG FILM Place 1 Film under the tongue in the morning and at bedtime.     eplerenone (INSPRA) 50 MG tablet Take 2 tablets (100 mg total) by mouth daily. 60 tablet 6   gabapentin (NEURONTIN) 800 MG tablet Take 1 tablet (800 mg total) by mouth 3 (three) times daily. 90 tablet 1   lactulose, encephalopathy, (CHRONULAC) 10 GM/15ML SOLN Take 30 mLs (20 g total) by mouth 2 (two) times daily.     metolazone (ZAROXOLYN) 2.5 MG tablet Take 1 tab, 4days a week on Monday, Wednesday  Fridays and Saturdays 30 tablet 0   metoprolol succinate (TOPROL XL) 25 MG 24 hr tablet Take 1 tablet (25 mg total) by mouth at bedtime. 30 tablet 11   ondansetron (ZOFRAN-ODT) 4 MG disintegrating tablet Take 4 mg by mouth every 8 (eight) hours as needed for nausea or vomiting (dissolve orally).     pantoprazole (PROTONIX) 40 MG tablet Take 40 mg by mouth 2 (two) times daily before a meal.     polyethylene glycol (MIRALAX / GLYCOLAX) 17 g packet Take 17 g by mouth as needed for mild constipation (mix and drink as directed).     potassium chloride (KLOR-CON) 20 MEQ packet Take  100 mEq by mouth 3 (three) times daily. Take an extra 40 mEq on days when you take metolazone 475 packet 3   pravastatin (PRAVACHOL) 20 MG tablet Take 1 tablet (20 mg total) by mouth daily. 30 tablet 3   sertraline (ZOLOFT) 50 MG tablet Take 1 tablet (50 mg total) by mouth daily. 30 tablet 3   tamsulosin (FLOMAX) 0.4 MG CAPS capsule Take 1 capsule (0.4 mg total) by mouth daily. Additional refills will need to come from PCP 30 capsule 0   torsemide (DEMADEX) 100 MG tablet TAKE ONE TABLET BY MOUTH DAILY IN THE MORNING AND EVENING 90 tablet 1   triamcinolone (KENALOG) 0.025 % ointment Apply 1 application. topically 2 (two) times daily. 30 g 0   VENTOLIN HFA 108 (90 Base) MCG/ACT inhaler Inhale 1 puff into the lungs every 4 (four) hours as needed for shortness of breath. (Patient taking  differently: Inhale 2 puffs into the lungs every 4 (four) hours as needed for shortness of breath.) 6.7 g 1   zolpidem (AMBIEN) 10 MG tablet Take 1 tablet (10 mg total) by mouth at bedtime as needed for sleep. 30 tablet 0   No current facility-administered medications for this encounter.   Allergies  Allergen Reactions   Penicillins Other (See Comments)    Caused convulsions as a child  Did it involve swelling of the face/tongue/throat, SOB, or low BP? N/A Did it involve sudden or severe rash/hives, skin peeling, or any reaction on the inside of your mouth or nose? N/A Did you need to seek medical attention at a hospital or doctor's office?N/A When did it last happen? N/A If all above answers are "NO", may proceed with cephalosporin use.   Social History   Socioeconomic History   Marital status: Single    Spouse name: Not on file   Number of children: Not on file   Years of education: Not on file   Highest education level: Not on file  Occupational History   Not on file  Tobacco Use   Smoking status: Never   Smokeless tobacco: Never  Vaping Use   Vaping Use: Never used  Substance and Sexual Activity    Alcohol use: Not Currently   Drug use: Not Currently    Types: Marijuana    Comment: Smoked for 30 years   Sexual activity: Not on file  Other Topics Concern   Not on file  Social History Narrative   Not on file   Social Determinants of Health   Financial Resource Strain: Medium Risk (11/15/2020)   Overall Financial Resource Strain (CARDIA)    Difficulty of Paying Living Expenses: Somewhat hard  Food Insecurity: No Food Insecurity (03/08/2022)   Hunger Vital Sign    Worried About Running Out of Food in the Last Year: Never true    Ran Out of Food in the Last Year: Never true  Transportation Needs: Unmet Transportation Needs (04/21/2022)   PRAPARE - Hydrologist (Medical): Yes    Lack of Transportation (Non-Medical): Yes  Physical Activity: Not on file  Stress: Not on file  Social Connections: Not on file  Intimate Partner Violence: Not At Risk (03/08/2022)   Humiliation, Afraid, Rape, and Kick questionnaire    Fear of Current or Ex-Partner: No    Emotionally Abused: No    Physically Abused: No    Sexually Abused: No    Family History  Problem Relation Age of Onset   Hyperlipidemia Mother    Hypertension Mother    Stroke Mother    CAD Maternal Grandmother    CAD Maternal Grandfather    BP 120/70   Pulse 80   Wt (!) 169.7 kg (374 lb 3.2 oz)   SpO2 97%   BMI 39.13 kg/m   Wt Readings from Last 3 Encounters:  07/01/22 (!) 169.7 kg (374 lb 3.2 oz)  06/18/22 (!) 162.8 kg (359 lb)  06/02/22 (!) 171 kg (377 lb)   PHYSICAL EXAM: General:  NAD. No resp difficulty, arrived in [facility], chronically ill appearing. HEENT: Normal Neck: Supple. No JVD, thick neck. Carotids 2+ bilat; no bruits. No lymphadenopathy or thryomegaly appreciated. Cor: PMI nondisplaced. Regular rate & rhythm. No rubs, gallops or murmurs. Lungs: Clear Abdomen: Soft, obese, nontender, nondistended. No hepatosplenomegaly. No bruits or masses. Good bowel sounds. Extremities: No  cyanosis, clubbing, rash, 2+ BLE edema with chronic lymphedema and venous stasis  in legs. BLE with rubor today Neuro: Alert & oriented x 3, cranial nerves grossly intact. Moves all 4 extremities w/o difficulty. Affect pleasant.   ASSESSMENT & PLAN: 1. Chronic diastolic CHF with suspected RV dysfunction: Echo 7/22 with EF 60-65%, mild LVH, normal RV, unable to estimate PA systolic pressure. Multiple CHF admissions despite aggressive outpatient diuretic regimen and paramedicine.  RHC (7/22) with optimized filling pressures, PCWP 13, CO/CI 7.3/2.4. Received IV lasix 80 mg through paramedicine 11/12/20. RHC (8/22) with normal filling pressures; underwent placement of Cardiomems (dPAP goal 9 mmHg). Re-admitted w/ marked volume overload and slight progression of symptoms 3/23. Echo 3/23 EF 60-65% RV normal this admission.  Echo this admission 11/23 with EF 55-60%, RV not well-visualized. Admitted with 20 lb weight gain despite no major change in his Cardiomems reading. Today, NYHA III symptoms, functional class confounded by body habitus and chronic lymphedema. He is not volume overloaded by Cardiomems, and weight is down 6 lbs. He continues with chronic LEE, suspect some related to venous stasis. - He is due for unna boots today or tomorrow, he recently finished a round of doxycycline for suspected cellulitis, per PCP. - Continue torsemide 100 mg bid + metolazone 2.5 mg M-W-F-S, with KCL 100 tid. BMET and BNP today. - Continue eplerenone 100 mg daily.  - Continue Toprol XL 25 mg qhs.  - Off Jardiance (GI upset). - He has 2 doses of Furoscix at home. 2. H/o PE/DVT: Suspect due to sedentary lifestyle.  Occurred in 2/21.  V/Q scan in 6/21 did not show evidence for chronic PE.   - Continue Eliquis. Check CBC today. 3. OSA: Cannot tolerate CPAP. 4. Anxiety/depression: This is a major issue.   - Continue sertraline 100 mg daily.  5. Hypertension: Stable. Continue current meds. 6. Atrial fibrillation:  Paroxysmal. Regular on exam today.  - Continue apixaban.  - Continue Toprol XL. 7. Cirrhosis: CT scan in past noted nodular contour of liver, compatible with underlying cirrhosis. In setting of known RV dysfunction and suspect cardiogenic. LFTs ok. EGD 12/22 showed portal hypertensive gastropathy, no varices.  This admission, abdominal US showed no ascites.  - On lactulose (also takes this for chronic constipation)  - Continue spironolactone.  8. Iron deficiency anemia: Received feraheme during past admit. Continues with fatigue, suspect is multifactorial. - Check iron studies today.   Continue paramedicine, appreciate their help.   Follow up in 3 months with Dr. [name].  Allena Katz, FNP-BC 07/01/22

## 2022-07-01 NOTE — Progress Notes (Signed)
Paramedicine Encounter    Patient ID: Charles Gray, male    DOB: March 03, 1956, 67 y.o.   MRN: [id number]  Arrived to meet [name] in the clinic today. He was reporting to not be feeling well- overall body aches and pains with fatigue, shortness of breath on exertion and swollen legs. He was brought in via wheel chair today by his transportation service. He has been med compliant and has missed no doses in his pill boxes. Today he was seen by Allena Katz NP who completed eval and made no changes to meds today. Ordered labs and requested he see Charles Gray in 3 months. She encouraged unna boots twice weekly which are being placed by [facility] RN ordered by his PCP. They should be placed tomorrow. I reviewed medications and filled pill box for two weeks.   Refills: Inspra Torsemide Metolazone   I plan to see Charles Gray in the home in two weeks. He agreed with plan.    ACTION: Home visit completed

## 2022-07-07 ENCOUNTER — Other Ambulatory Visit (HOSPITAL_COMMUNITY): Payer: Self-pay

## 2022-07-07 DIAGNOSIS — I5032 Chronic diastolic (congestive) heart failure: Secondary | ICD-10-CM

## 2022-07-08 ENCOUNTER — Other Ambulatory Visit (HOSPITAL_COMMUNITY): Payer: Self-pay | Admitting: Family Medicine

## 2022-07-08 DIAGNOSIS — F332 Major depressive disorder, recurrent severe without psychotic features: Secondary | ICD-10-CM | POA: Diagnosis not present

## 2022-07-08 DIAGNOSIS — F1120 Opioid dependence, uncomplicated: Secondary | ICD-10-CM | POA: Diagnosis not present

## 2022-07-08 DIAGNOSIS — Z79891 Long term (current) use of opiate analgesic: Secondary | ICD-10-CM | POA: Diagnosis not present

## 2022-07-08 DIAGNOSIS — F411 Generalized anxiety disorder: Secondary | ICD-10-CM | POA: Diagnosis not present

## 2022-07-09 ENCOUNTER — Telehealth (HOSPITAL_COMMUNITY): Payer: Self-pay

## 2022-07-09 NOTE — Telephone Encounter (Signed)
Garo reached out reporting his pharmacy is needing a refill for Metolazone sent in.    -[place], [place] 07/09/2022

## 2022-07-10 DIAGNOSIS — I872 Venous insufficiency (chronic) (peripheral): Secondary | ICD-10-CM | POA: Diagnosis not present

## 2022-07-10 DIAGNOSIS — I890 Lymphedema, not elsewhere classified: Secondary | ICD-10-CM | POA: Diagnosis not present

## 2022-07-10 DIAGNOSIS — M5126 Other intervertebral disc displacement, lumbar region: Secondary | ICD-10-CM | POA: Diagnosis not present

## 2022-07-10 DIAGNOSIS — I5032 Chronic diastolic (congestive) heart failure: Secondary | ICD-10-CM | POA: Diagnosis not present

## 2022-07-10 DIAGNOSIS — I480 Paroxysmal atrial fibrillation: Secondary | ICD-10-CM | POA: Diagnosis not present

## 2022-07-10 DIAGNOSIS — I110 Hypertensive heart disease with heart failure: Secondary | ICD-10-CM | POA: Diagnosis not present

## 2022-07-10 DIAGNOSIS — G894 Chronic pain syndrome: Secondary | ICD-10-CM | POA: Diagnosis not present

## 2022-07-10 DIAGNOSIS — D509 Iron deficiency anemia, unspecified: Secondary | ICD-10-CM | POA: Diagnosis not present

## 2022-07-10 DIAGNOSIS — J449 Chronic obstructive pulmonary disease, unspecified: Secondary | ICD-10-CM | POA: Diagnosis not present

## 2022-07-13 ENCOUNTER — Other Ambulatory Visit (HOSPITAL_COMMUNITY): Payer: Self-pay

## 2022-07-13 ENCOUNTER — Telehealth (HOSPITAL_COMMUNITY): Payer: Self-pay

## 2022-07-13 DIAGNOSIS — I5032 Chronic diastolic (congestive) heart failure: Secondary | ICD-10-CM

## 2022-07-13 MED ORDER — METOLAZONE 2.5 MG PO TABS: ORAL_TABLET | ORAL | 0 refills | Status: DC

## 2022-07-13 NOTE — Telephone Encounter (Signed)
[name], Paramedic Certified Medical Assistant   Telephone Encounter Signed   Encounter Date: 07/09/2022   Signed      Meryl Crutch reached out reporting his pharmacy is needing a refill for Metolazone sent in.      -[place], [place] 07/09/2022

## 2022-07-14 ENCOUNTER — Other Ambulatory Visit (HOSPITAL_COMMUNITY): Payer: Self-pay

## 2022-07-14 NOTE — Progress Notes (Signed)
Paramedicine Encounter    Patient ID: Charles Gray, male    DOB: 1955-07-19, 67 y.o.   MRN: [id number]   Complaints- general fatigue and chronic pain.   Assessment- CAOX4, warm and dry seated in his recliner, both legs wrapped in UNNA BOOTS (yesterday by [facility]) Lungs clear, vitals obtained, BP slightly elevated today he has not taken his morning meds yet today. Cardio Mems is 6 today. Weight is down from last visit, but up 4 lbs since yesterday- he did not take metolazone on Saturday last week or Yesterday.   Compliance with meds- compliant aside from skipped metolazone here and there.   Pill box filled- for two weeks.   Refills needed- sertraline, metoprolol   Meds changes since last visit- none     Social changes- none    BP (!) 140/82   Pulse 87   Resp 16   Wt (!) 365 lb (165.6 kg)   SpO2 94%   BMI 38.17 kg/m  Weight yesterday-- 361lbs Last visit weight-- 374lbs  Today- 365lbs    Arrived for home visit for [name] who reports to be feeling poorly. He states he is fatigued, short of breath and hurting. These are chronic complaints for [name]. Upon assessment I noted both lower legs wrapped with unna boot supplies- he reports [facility] RN did this yesterday. He is still receiving weekly PT which he says is hard to engage in due to his chronic fatigue and pain. I obtained vitals as noted. Lungs clear. I reviewed medications he has been compliant mostly aside from skipping a few metolazone doses here and there. His weight is down 9 lbs from our last visit. He is up 4 lbs from yesterday. He walked from his recliner to the bathroom which is about 5 yards and he did not appear short of breath on ambulating. I confirmed meds and filled pill box for two weeks. Refills as noted will be called in next week. We reviewed upcoming appointments and confirmed same. I reviewed HF education such as med compliance, and diet with him- he verbalized understanding. He had no other concerns at present I  will see him in two weeks.   [name], [place]  ACTION: Home visit completed    Patient Care Team: Enid Skeens., MD as PCP - General (Family Medicine) Larey Dresser, MD as PCP - Cardiology (Cardiology) Jorge Ny, LCSW as Social Worker (Licensed Clinical Social Worker)  Patient Active Problem List   Diagnosis Date Noted   Acute on chronic heart failure with preserved ejection fraction (HFpEF) 03/04/2022   Dyslipidemia 07/17/2021   Lymphedema 07/08/2021   Melena    Gastric polyps    UGIB (upper gastrointestinal bleed) 04/06/2021   Acute encephalopathy 12/21/2020   COPD (chronic obstructive pulmonary disease) 12/21/2020   Chronic diastolic (congestive) heart failure 11/14/2020   Syncope 10/28/2020   CHF (congestive heart failure) 10/28/2020   Chronic venous stasis dermatitis of both lower extremities 08/27/2020   Cellulitis of both lower extremities 08/14/2020   Iron deficiency anemia 08/14/2020   GERD without esophagitis 06/10/2020   Atrial fibrillation with rapid ventricular response 03/24/2020   Acute on chronic right heart failure 01/27/2020   Acute respiratory failure with hypoxia    Fatty liver disease, nonalcoholic    Shock    Personal history of DVT (deep vein thrombosis) 12/26/2019   Chronic anticoagulation 12/26/2019   Palpitations 12/26/2019   Paroxysmal atrial fibrillation 12/23/2019   Heart failure, systolic, acute [id number]   Acute  on chronic congestive heart failure    Acute on chronic diastolic CHF (congestive heart failure) 08/23/2019   Chest pain 08/23/2019   Lactic acidosis 08/23/2019   History of pulmonary embolism 08/12/2019   Atrial tachycardia 08/12/2019   Occasional tremors 08/12/2019   OSA (obstructive sleep apnea) 08/12/2019   AKI (acute kidney injury) 08/08/2019   Acute on chronic right-sided heart failure 08/06/2019   Acute pulmonary embolism 06/08/2019   Normocytic anemia 06/08/2019   Pulmonary  emboli 06/08/2019   Chronic heart failure with preserved ejection fraction 05/30/2019   Class 2 severe obesity due to excess calories with serious comorbidity and body mass index (BMI) of 39.0 to 39.9 in adult 05/30/2019   Morbid obesity with BMI of 40.0-44.9, adult 05/30/2019   Hyponatremia    Drug induced constipation    Peripheral edema    Hypotension due to drugs    Hypoalbuminemia due to protein-calorie malnutrition    Thrombocytopenia    Anemia of chronic disease    Cirrhosis of liver without ascites    Chronic pain syndrome    Debility 03/13/2019   Portal hypertensive gastropathy 03/06/2019   Dyspnea    GIB (gastrointestinal bleeding) 03/04/2019   Mixed hyperlipidemia 01/24/2019   Insomnia 01/24/2019   Hyperlipidemia 01/24/2019   Essential hypertension 01/24/2019   Lumbar disc herniation 01/24/2019    Current Outpatient Medications:    apixaban (ELIQUIS) 5 MG TABS tablet, Take 1 tablet (5 mg total) by mouth 2 (two) times daily., Disp: 60 tablet, Rfl: 3   Buprenorphine HCl-Naloxone HCl 8-2 MG FILM, Place 1 Film under the tongue in the morning and at bedtime., Disp: , Rfl:    eplerenone (INSPRA) 50 MG tablet, Take 2 tablets (100 mg total) by mouth daily., Disp: 60 tablet, Rfl: 6   gabapentin (NEURONTIN) 800 MG tablet, Take 1 tablet (800 mg total) by mouth 3 (three) times daily., Disp: 90 tablet, Rfl: 1   lactulose, encephalopathy, (CHRONULAC) 10 GM/15ML SOLN, Take 30 mLs (20 g total) by mouth 2 (two) times daily., Disp: , Rfl:    metolazone (ZAROXOLYN) 2.5 MG tablet, Take 1 tab, 4days a week on Monday, Wednesday  Fridays and Saturdays, Disp: 30 tablet, Rfl: 0   metoprolol succinate (TOPROL XL) 25 MG 24 hr tablet, Take 1 tablet (25 mg total) by mouth at bedtime., Disp: 30 tablet, Rfl: 11   ondansetron (ZOFRAN-ODT) 4 MG disintegrating tablet, Take 4 mg by mouth every 8 (eight) hours as needed for nausea or vomiting (dissolve orally)., Disp: , Rfl:    pantoprazole (PROTONIX) 40 MG  tablet, Take 40 mg by mouth 2 (two) times daily before a meal., Disp: , Rfl:    polyethylene glycol (MIRALAX / GLYCOLAX) 17 g packet, Take 17 g by mouth as needed for mild constipation (mix and drink as directed)., Disp: , Rfl:    potassium chloride (KLOR-CON) 20 MEQ packet, Take 100 mEq by mouth 3 (three) times daily. Take an extra 40 mEq on days when you take metolazone, Disp: 475 packet, Rfl: 3   pravastatin (PRAVACHOL) 20 MG tablet, Take 1 tablet (20 mg total) by mouth daily., Disp: 30 tablet, Rfl: 3   sertraline (ZOLOFT) 50 MG tablet, Take 1 tablet (50 mg total) by mouth daily., Disp: 30 tablet, Rfl: 3   tamsulosin (FLOMAX) 0.4 MG CAPS capsule, Take 1 capsule (0.4 mg total) by mouth daily. Additional refills will need to come from PCP, Disp: 30 capsule, Rfl: 0   torsemide (DEMADEX) 100 MG tablet, TAKE ONE  TABLET BY MOUTH DAILY IN THE MORNING AND EVENING, Disp: 90 tablet, Rfl: 1   triamcinolone (KENALOG) 0.025 % ointment, Apply 1 application. topically 2 (two) times daily., Disp: 30 g, Rfl: 0   VENTOLIN HFA 108 (90 Base) MCG/ACT inhaler, Inhale 1 puff into the lungs every 4 (four) hours as needed for shortness of breath. (Patient taking differently: Inhale 2 puffs into the lungs every 4 (four) hours as needed for shortness of breath.), Disp: 6.7 g, Rfl: 1   zolpidem (AMBIEN) 10 MG tablet, Take 1 tablet (10 mg total) by mouth at bedtime as needed for sleep., Disp: 30 tablet, Rfl: 0 Allergies  Allergen Reactions   Penicillins Other (See Comments)    Caused convulsions as a child  Did it involve swelling of the face/tongue/throat, SOB, or low BP? N/A Did it involve sudden or severe rash/hives, skin peeling, or any reaction on the inside of your mouth or nose? N/A Did you need to seek medical attention at a hospital or doctor's office?N/A When did it last happen? N/A If all above answers are "NO", may proceed with cephalosporin use.     Social History   Socioeconomic History   Marital  status: Single    Spouse name: Not on file   Number of children: Not on file   Years of education: Not on file   Highest education level: Not on file  Occupational History   Not on file  Tobacco Use   Smoking status: Never   Smokeless tobacco: Never  Vaping Use   Vaping Use: Never used  Substance and Sexual Activity   Alcohol use: Not Currently   Drug use: Not Currently    Types: Marijuana    Comment: Smoked for 30 years   Sexual activity: Not on file  Other Topics Concern   Not on file  Social History Narrative   Not on file   Social Determinants of Health   Financial Resource Strain: Medium Risk (11/15/2020)   Overall Financial Resource Strain (CARDIA)    Difficulty of Paying Living Expenses: Somewhat hard  Food Insecurity: No Food Insecurity (03/08/2022)   Hunger Vital Sign    Worried About Running Out of Food in the Last Year: Never true    Ran Out of Food in the Last Year: Never true  Transportation Needs: Unmet Transportation Needs (04/21/2022)   PRAPARE - Hydrologist (Medical): Yes    Lack of Transportation (Non-Medical): Yes  Physical Activity: Not on file  Stress: Not on file  Social Connections: Not on file  Intimate Partner Violence: Not At Risk (03/08/2022)   Humiliation, Afraid, Rape, and Kick questionnaire    Fear of Current or Ex-Partner: No    Emotionally Abused: No    Physically Abused: No    Sexually Abused: No    Physical Exam      Future Appointments  Date Time Provider [place]  07/21/2022 11:00 AM [id number] None

## 2022-07-17 ENCOUNTER — Encounter (HOSPITAL_COMMUNITY): Payer: Medicare HMO

## 2022-07-20 ENCOUNTER — Other Ambulatory Visit (HOSPITAL_COMMUNITY): Payer: Self-pay | Admitting: *Deleted

## 2022-07-21 ENCOUNTER — Encounter (HOSPITAL_COMMUNITY)
Admission: RE | Admit: 2022-07-21 | Discharge: 2022-07-21 | Disposition: A | Discharge status: Home / Self Care | Payer: Medicare HMO | Source: Ambulatory Visit | Attending: Cardiology | Admitting: Cardiology

## 2022-07-21 DIAGNOSIS — I5032 Chronic diastolic (congestive) heart failure: Secondary | ICD-10-CM | POA: Diagnosis not present

## 2022-07-21 DIAGNOSIS — R6889 Other general symptoms and signs: Secondary | ICD-10-CM | POA: Diagnosis not present

## 2022-07-21 MED ORDER — SODIUM CHLORIDE 0.9 % IV SOLN
510.0000 mg | Freq: Once | INTRAVENOUS | Status: AC
  Administered 2022-07-21: 510 mg via INTRAVENOUS
  Filled 2022-07-21: qty 510

## 2022-07-22 DIAGNOSIS — F1120 Opioid dependence, uncomplicated: Secondary | ICD-10-CM | POA: Diagnosis not present

## 2022-07-22 DIAGNOSIS — Z6838 Body mass index (BMI) 38.0-38.9, adult: Secondary | ICD-10-CM | POA: Diagnosis not present

## 2022-07-22 DIAGNOSIS — F411 Generalized anxiety disorder: Secondary | ICD-10-CM | POA: Diagnosis not present

## 2022-07-22 DIAGNOSIS — F332 Major depressive disorder, recurrent severe without psychotic features: Secondary | ICD-10-CM | POA: Diagnosis not present

## 2022-07-28 ENCOUNTER — Other Ambulatory Visit (HOSPITAL_COMMUNITY): Payer: Self-pay

## 2022-07-28 NOTE — Progress Notes (Signed)
Paramedicine Encounter    Patient ID: Charles Gray, male    DOB: 1955-08-24, 67 y.o.   MRN: [id number]   Complaints- generalized pain, shortness of breath, lower leg swelling and redness, constipation, nausea, leg pain, depression, decreased appetite, weight loss  Assessment- CAOx4, warm to touch, skin dry, lungs clear, vitals as noted, lower legs red and swollen, no unna boots on today (took off yesterday will be redone tomorrow)  Compliance with meds- skipped one dose of metolazone due to low cardiomems and weight loss in the last week   Pill box filled- for two weeks   Refills needed- metolazone, inspra, torsemide   Meds changes since last visit- none     Social changes- none   Arrived for home visit for [facility] where he was seated A&Ox4, warm and dry reporting to be feeling unwell today reporting general pain every where, nausea, constipation, shortness of breath, lower leg swelling and pain, weight loss, decreased appetite and depression. He states he has been not eating as much lately because he is depressed more than normal. I advised him we should reach out to his doctor (Dr. Orvan Falconer) for advise on med change or increasing dose of Zoloft which he has been on for several years. He agreed he would talk to him next week when he has a televisit with him on Wednesday. I reviewed meds and filled pill box for two weeks. He advised he skipped one dose of metolazone because his cardio mems has been down and his weight is down and he feels dehydrated. He recently had an iron infusion and is curious to know what his labs are now that this is completed. I advised him I could ask the triage team same. He states he is feeling worse since the iron infusion. Assessment and vitals as noted. We reviewed HF education with diet, med compliance and the need for increased movement as he complains of increased joint pain but he is mostly sedentary. He stated he understood. I reviewed upcoming appointments and  confirmed our next visit in two weeks. He agreed. Home visit complete. He is aware to reach out to PCP for generalized complaints and I advised I am happy to assist him in reaching out if needed. He said not at present but he plans to think about it. I will see him in two weeks.    BP 122/68   Pulse 89   Resp 18   Wt (!) 350 lb (158.8 kg)   SpO2 97%   BMI 36.60 kg/m  Weight yesterday--352lbs Last visit weight-- 355lbs   Maralyn Sago, EMT-Paramedic [phone]  ACTION: Home visit completed    Patient Care Team: Nonnie Done., MD as PCP - General (Family Medicine) Laurey Morale, MD as PCP - Cardiology (Cardiology) [name], LCSW as Social Worker (Licensed Clinical Social Worker)  Patient Active Problem List   Diagnosis Date Noted   Acute on chronic heart failure with preserved ejection fraction (HFpEF) 03/04/2022   Dyslipidemia 07/17/2021   Lymphedema 07/08/2021   Melena    Gastric polyps    UGIB (upper gastrointestinal bleed) 04/06/2021   Acute encephalopathy 12/21/2020   COPD (chronic obstructive pulmonary disease) 12/21/2020   Chronic diastolic (congestive) heart failure 11/14/2020   Syncope 10/28/2020   CHF (congestive heart failure) 10/28/2020   Chronic venous stasis dermatitis of both lower extremities 08/27/2020   Cellulitis of both lower extremities 08/14/2020   Iron deficiency anemia 08/14/2020   GERD without esophagitis 06/10/2020   Atrial fibrillation  with rapid ventricular response 03/24/2020   Acute on chronic right heart failure 01/27/2020   Acute respiratory failure with hypoxia    Fatty liver disease, nonalcoholic    Shock    Personal history of DVT (deep vein thrombosis) 12/26/2019   Chronic anticoagulation 12/26/2019   Palpitations 12/26/2019   Paroxysmal atrial fibrillation 12/23/2019   Heart failure, systolic, acute 08/24/2019   Acute on chronic congestive heart failure    Acute on chronic diastolic CHF (congestive heart  failure) 08/23/2019   Chest pain 08/23/2019   Lactic acidosis 08/23/2019   History of pulmonary embolism 08/12/2019   Atrial tachycardia 08/12/2019   Occasional tremors 08/12/2019   OSA (obstructive sleep apnea) 08/12/2019   AKI (acute kidney injury) 08/08/2019   Acute on chronic right-sided heart failure 08/06/2019   Acute pulmonary embolism 06/08/2019   Normocytic anemia 06/08/2019   Pulmonary emboli 06/08/2019   Chronic heart failure with preserved ejection fraction 05/30/2019   Class 2 severe obesity due to excess calories with serious comorbidity and body mass index (BMI) of 39.0 to 39.9 in adult 05/30/2019   Morbid obesity with BMI of 40.0-44.9, adult 05/30/2019   Hyponatremia    Drug induced constipation    Peripheral edema    Hypotension due to drugs    Hypoalbuminemia due to protein-calorie malnutrition    Thrombocytopenia    Anemia of chronic disease    Cirrhosis of liver without ascites    Chronic pain syndrome    Debility 03/13/2019   Portal hypertensive gastropathy 03/06/2019   Dyspnea    GIB (gastrointestinal bleeding) 03/04/2019   Mixed hyperlipidemia 01/24/2019   Insomnia 01/24/2019   Hyperlipidemia 01/24/2019   Essential hypertension 01/24/2019   Lumbar disc herniation 01/24/2019    Current Outpatient Medications:    apixaban (ELIQUIS) 5 MG TABS tablet, Take 1 tablet (5 mg total) by mouth 2 (two) times daily., Disp: 60 tablet, Rfl: 3   Buprenorphine HCl-Naloxone HCl 8-2 MG FILM, Place 1 Film under the tongue in the morning and at bedtime., Disp: , Rfl:    eplerenone (INSPRA) 50 MG tablet, Take 2 tablets (100 mg total) by mouth daily., Disp: 60 tablet, Rfl: 6   gabapentin (NEURONTIN) 800 MG tablet, Take 1 tablet (800 mg total) by mouth 3 (three) times daily., Disp: 90 tablet, Rfl: 1   lactulose, encephalopathy, (CHRONULAC) 10 GM/15ML SOLN, Take 30 mLs (20 g total) by mouth 2 (two) times daily., Disp: , Rfl:    metolazone (ZAROXOLYN) 2.5 MG tablet, Take 1 tab,  4days a week on Monday, Wednesday  Fridays and Saturdays, Disp: 30 tablet, Rfl: 0   metoprolol succinate (TOPROL XL) 25 MG 24 hr tablet, Take 1 tablet (25 mg total) by mouth at bedtime., Disp: 30 tablet, Rfl: 11   ondansetron (ZOFRAN-ODT) 4 MG disintegrating tablet, Take 4 mg by mouth every 8 (eight) hours as needed for nausea or vomiting (dissolve orally)., Disp: , Rfl:    pantoprazole (PROTONIX) 40 MG tablet, Take 40 mg by mouth 2 (two) times daily before a meal., Disp: , Rfl:    polyethylene glycol (MIRALAX / GLYCOLAX) 17 g packet, Take 17 g by mouth as needed for mild constipation (mix and drink as directed)., Disp: , Rfl:    potassium chloride (KLOR-CON) 20 MEQ packet, Take 100 mEq by mouth 3 (three) times daily. Take an extra 40 mEq on days when you take metolazone, Disp: 475 packet, Rfl: 3   pravastatin (PRAVACHOL) 20 MG tablet, Take 1 tablet (20 mg total)  by mouth daily., Disp: 30 tablet, Rfl: 3   sertraline (ZOLOFT) 50 MG tablet, Take 1 tablet (50 mg total) by mouth daily., Disp: 30 tablet, Rfl: 3   tamsulosin (FLOMAX) 0.4 MG CAPS capsule, Take 1 capsule (0.4 mg total) by mouth daily. Additional refills will need to come from PCP, Disp: 30 capsule, Rfl: 0   torsemide (DEMADEX) 100 MG tablet, TAKE ONE TABLET BY MOUTH DAILY IN THE MORNING AND EVENING, Disp: 90 tablet, Rfl: 1   triamcinolone (KENALOG) 0.025 % ointment, Apply 1 application. topically 2 (two) times daily., Disp: 30 g, Rfl: 0   VENTOLIN HFA 108 (90 Base) MCG/ACT inhaler, Inhale 1 puff into the lungs every 4 (four) hours as needed for shortness of breath. (Patient taking differently: Inhale 2 puffs into the lungs every 4 (four) hours as needed for shortness of breath.), Disp: 6.7 g, Rfl: 1   zolpidem (AMBIEN) 10 MG tablet, Take 1 tablet (10 mg total) by mouth at bedtime as needed for sleep., Disp: 30 tablet, Rfl: 0 Allergies  Allergen Reactions   Penicillins Other (See Comments)    Caused convulsions as a child  Did it involve  swelling of the face/tongue/throat, SOB, or low BP? N/A Did it involve sudden or severe rash/hives, skin peeling, or any reaction on the inside of your mouth or nose? N/A Did you need to seek medical attention at a hospital or doctor's office?N/A When did it last happen? N/A If all above answers are "NO", may proceed with cephalosporin use.     Social History   Socioeconomic History   Marital status: Single    Spouse name: Not on file   Number of children: Not on file   Years of education: Not on file   Highest education level: Not on file  Occupational History   Not on file  Tobacco Use   Smoking status: Never   Smokeless tobacco: Never  Vaping Use   Vaping Use: Never used  Substance and Sexual Activity   Alcohol use: Not Currently   Drug use: Not Currently    Types: Marijuana    Comment: Smoked for 30 years   Sexual activity: Not on file  Other Topics Concern   Not on file  Social History Narrative   Not on file   Social Determinants of Health   Financial Resource Strain: Medium Risk (11/15/2020)   Overall Financial Resource Strain (CARDIA)    Difficulty of Paying Living Expenses: Somewhat hard  Food Insecurity: No Food Insecurity (03/08/2022)   Hunger Vital Sign    Worried About Running Out of Food in the Last Year: Never true    Ran Out of Food in the Last Year: Never true  Transportation Needs: Unmet Transportation Needs (04/21/2022)   PRAPARE - Administrator, Civil Service (Medical): Yes    Lack of Transportation (Non-Medical): Yes  Physical Activity: Not on file  Stress: Not on file  Social Connections: Not on file  Intimate Partner Violence: Not At Risk (03/08/2022)   Humiliation, Afraid, Rape, and Kick questionnaire    Fear of Current or Ex-Partner: No    Emotionally Abused: No    Physically Abused: No    Sexually Abused: No    Physical Exam      No future appointments.

## 2022-08-05 ENCOUNTER — Other Ambulatory Visit (HOSPITAL_COMMUNITY): Payer: Self-pay

## 2022-08-05 DIAGNOSIS — I5032 Chronic diastolic (congestive) heart failure: Secondary | ICD-10-CM

## 2022-08-05 DIAGNOSIS — F332 Major depressive disorder, recurrent severe without psychotic features: Secondary | ICD-10-CM | POA: Diagnosis not present

## 2022-08-05 DIAGNOSIS — F411 Generalized anxiety disorder: Secondary | ICD-10-CM | POA: Diagnosis not present

## 2022-08-05 DIAGNOSIS — F1120 Opioid dependence, uncomplicated: Secondary | ICD-10-CM | POA: Diagnosis not present

## 2022-08-05 MED ORDER — METOLAZONE 2.5 MG PO TABS: ORAL_TABLET | ORAL | 0 refills | Status: DC

## 2022-08-09 DIAGNOSIS — I890 Lymphedema, not elsewhere classified: Secondary | ICD-10-CM | POA: Diagnosis not present

## 2022-08-09 DIAGNOSIS — I110 Hypertensive heart disease with heart failure: Secondary | ICD-10-CM | POA: Diagnosis not present

## 2022-08-09 DIAGNOSIS — G894 Chronic pain syndrome: Secondary | ICD-10-CM | POA: Diagnosis not present

## 2022-08-09 DIAGNOSIS — I480 Paroxysmal atrial fibrillation: Secondary | ICD-10-CM | POA: Diagnosis not present

## 2022-08-09 DIAGNOSIS — I5032 Chronic diastolic (congestive) heart failure: Secondary | ICD-10-CM | POA: Diagnosis not present

## 2022-08-09 DIAGNOSIS — I872 Venous insufficiency (chronic) (peripheral): Secondary | ICD-10-CM | POA: Diagnosis not present

## 2022-08-09 DIAGNOSIS — J449 Chronic obstructive pulmonary disease, unspecified: Secondary | ICD-10-CM | POA: Diagnosis not present

## 2022-08-09 DIAGNOSIS — M5126 Other intervertebral disc displacement, lumbar region: Secondary | ICD-10-CM | POA: Diagnosis not present

## 2022-08-09 DIAGNOSIS — D509 Iron deficiency anemia, unspecified: Secondary | ICD-10-CM | POA: Diagnosis not present

## 2022-08-11 ENCOUNTER — Telehealth (HOSPITAL_COMMUNITY): Payer: Self-pay

## 2022-08-11 ENCOUNTER — Other Ambulatory Visit (HOSPITAL_COMMUNITY): Payer: Self-pay

## 2022-08-11 NOTE — Telephone Encounter (Signed)
Saw Charles Gray the home today who reports to be feeling not well today. He complains of fatigue, dizziness, constipation, frequent urination, incontinence of urine, not sleeping well, 5 lb weight loss from yesterday to today. He says he split his metolazone in half on Saturday but is still exhibiting signs of dehydration. His cardio mems today is 3 with goal of 9.   WT today is 342lbs  WT yesterday was 347lbs  (Weight down 5 lbs overnight)  BP- 112/68  HR- 77  (No ortho changes)      Gray am going to ask HF clinic if we can reduce his metolazone to 3x's per week and use on Saturdays if needed. He feels the 4 doses if causing him to be dehydrated to be feeling bad. Gray will confirm with clinic. Note forwarded to triage.   Maralyn Sago, EMT-Paramedic [phone] 08/11/2022

## 2022-08-11 NOTE — Progress Notes (Signed)
Paramedicine Encounter    Patient ID: Charles Gray, male    DOB: 1956/03/11, 67 y.o.   MRN: [id number]   Complaints- fatigue, generalized pain, constipation, urinating often with some incontinence of urine, no sleeping well.   Assessment- CAOX4, warm and dry, seated in recliner, lungs clear, legs wrapped in unna wraps, weight is down 5 lbs from yesterday, he reports urinating a lot and sometimes on himself, he complains of fatigue but not sleeping well due to incontinence of urine. Cardio Mems is 3 today. Goal is 9- appears to be dehydrated.   Compliance with meds- no missed doses- will cut metolazone in half on Saturdays sometimes but is still losing weight and cardio mems is low.   Pill box filled- for two weeks. (Metolazone dose pending)   Refills needed- pravastatin   Meds changes since last visit- none     Social changes- none    BP 112/68   Pulse 77   Resp 16   Wt (!) 342 lb (155.1 kg)   SpO2 97%   BMI 35.76 kg/m  Weight yesterday-- 347lbs  Weight today- 342lbs  Last visit weight-- 350lbs   Arrived for home visit for Charles Gray who reports to be feeling not well today. He complains of fatigue, dizziness, constipation, frequent urination, incontinence of urine, not sleeping well, 5 lb weight loss from yesterday to today. He says he split his metolazone in half on Saturday but is still exhibiting signs of dehydration. His cardio mems today is 3 with goal of 9- I am going to ask HF clinic if we can reduce his metolazone to 3x's per week and use on Saturdays if needed. He feels the 4 doses if causing him to be dehydrated to be feeling bad. I will confirm with clinic. I obtained vitals and confirmed. I reviewed meds and filled pill box for two weeks. Refills as noted. He had his legs wrapped yesterday by [facility] RN. Lungs clear. Home visit complete. I will see Charles Gray in two weeks and he knows to reach out if needed.    Maralyn Sago, EMT-Paramedic [phone]  ACTION: Home visit  completed    Patient Care Team: Nonnie Done., MD as PCP - General (Family Medicine) Laurey Morale, MD as PCP - Cardiology (Cardiology) [name], LCSW as Social Worker (Licensed Clinical Social Worker)  Patient Active Problem List   Diagnosis Date Noted   Acute on chronic heart failure with preserved ejection fraction (HFpEF) (HCC) 03/04/2022   Dyslipidemia 07/17/2021   Lymphedema 07/08/2021   Melena    Gastric polyps    UGIB (upper gastrointestinal bleed) 04/06/2021   Acute encephalopathy 12/21/2020   COPD (chronic obstructive pulmonary disease) (HCC) 12/21/2020   Chronic diastolic (congestive) heart failure (HCC) 11/14/2020   Syncope 10/28/2020   CHF (congestive heart failure) (HCC) 10/28/2020   Chronic venous stasis dermatitis of both lower extremities 08/27/2020   Cellulitis of both lower extremities 08/14/2020   Iron deficiency anemia 08/14/2020   GERD without esophagitis 06/10/2020   Atrial fibrillation with rapid ventricular response (HCC) 03/24/2020   Acute on chronic right heart failure (HCC) 01/27/2020   Acute respiratory failure with hypoxia (HCC)    Fatty liver disease, nonalcoholic    Shock (HCC)    Personal history of DVT (deep vein thrombosis) 12/26/2019   Chronic anticoagulation 12/26/2019   Palpitations 12/26/2019   Paroxysmal atrial fibrillation (HCC) 12/23/2019   Heart failure, systolic, acute (HCC) 08/24/2019   Acute on chronic congestive heart failure (HCC)  Acute on chronic diastolic CHF (congestive heart failure) (HCC) 08/23/2019   Chest pain 08/23/2019   Lactic acidosis 08/23/2019   History of pulmonary embolism 08/12/2019   Atrial tachycardia 08/12/2019   Occasional tremors 08/12/2019   OSA (obstructive sleep apnea) 08/12/2019   AKI (acute kidney injury) (HCC) 08/08/2019   Acute on chronic right-sided heart failure (HCC) 08/06/2019   Acute pulmonary embolism (HCC) 06/08/2019   Normocytic anemia 06/08/2019   Pulmonary emboli (HCC)  06/08/2019   Chronic heart failure with preserved ejection fraction (HCC) 05/30/2019   Class 2 severe obesity due to excess calories with serious comorbidity and body mass index (BMI) of 39.0 to 39.9 in adult (HCC) 05/30/2019   Morbid obesity with BMI of 40.0-44.9, adult (HCC) 05/30/2019   Hyponatremia    Drug induced constipation    Peripheral edema    Hypotension due to drugs    Hypoalbuminemia due to protein-calorie malnutrition (HCC)    Thrombocytopenia (HCC)    Anemia of chronic disease    Cirrhosis of liver without ascites (HCC)    Chronic pain syndrome    Debility 03/13/2019   Portal hypertensive gastropathy (HCC) 03/06/2019   Dyspnea    GIB (gastrointestinal bleeding) 03/04/2019   Mixed hyperlipidemia 01/24/2019   Insomnia 01/24/2019   Hyperlipidemia 01/24/2019   Essential hypertension 01/24/2019   Lumbar disc herniation 01/24/2019    Current Outpatient Medications:    apixaban (ELIQUIS) 5 MG TABS tablet, Take 1 tablet (5 mg total) by mouth 2 (two) times daily., Disp: 60 tablet, Rfl: 3   Buprenorphine HCl-Naloxone HCl 8-2 MG FILM, Place 1 Film under the tongue in the morning and at bedtime., Disp: , Rfl:    eplerenone (INSPRA) 50 MG tablet, Take 2 tablets (100 mg total) by mouth daily., Disp: 60 tablet, Rfl: 6   gabapentin (NEURONTIN) 800 MG tablet, Take 1 tablet (800 mg total) by mouth 3 (three) times daily., Disp: 90 tablet, Rfl: 1   lactulose, encephalopathy, (CHRONULAC) 10 GM/15ML SOLN, Take 30 mLs (20 g total) by mouth 2 (two) times daily., Disp: , Rfl:    metolazone (ZAROXOLYN) 2.5 MG tablet, Take 1 tab, 4days a week on Monday, Wednesday  Fridays and Saturdays, Disp: 30 tablet, Rfl: 0   metoprolol succinate (TOPROL XL) 25 MG 24 hr tablet, Take 1 tablet (25 mg total) by mouth at bedtime., Disp: 30 tablet, Rfl: 11   ondansetron (ZOFRAN-ODT) 4 MG disintegrating tablet, Take 4 mg by mouth every 8 (eight) hours as needed for nausea or vomiting (dissolve orally)., Disp: , Rfl:     pantoprazole (PROTONIX) 40 MG tablet, Take 40 mg by mouth 2 (two) times daily before a meal., Disp: , Rfl:    polyethylene glycol (MIRALAX / GLYCOLAX) 17 g packet, Take 17 g by mouth as needed for mild constipation (mix and drink as directed)., Disp: , Rfl:    potassium chloride (KLOR-CON) 20 MEQ packet, Take 100 mEq by mouth 3 (three) times daily. Take an extra 40 mEq on days when you take metolazone, Disp: 475 packet, Rfl: 3   pravastatin (PRAVACHOL) 20 MG tablet, Take 1 tablet (20 mg total) by mouth daily., Disp: 30 tablet, Rfl: 3   sertraline (ZOLOFT) 50 MG tablet, Take 1 tablet (50 mg total) by mouth daily., Disp: 30 tablet, Rfl: 3   tamsulosin (FLOMAX) 0.4 MG CAPS capsule, Take 1 capsule (0.4 mg total) by mouth daily. Additional refills will need to come from PCP, Disp: 30 capsule, Rfl: 0   torsemide (DEMADEX) 100  MG tablet, TAKE ONE TABLET BY MOUTH DAILY IN THE MORNING AND EVENING, Disp: 90 tablet, Rfl: 1   triamcinolone (KENALOG) 0.025 % ointment, Apply 1 application. topically 2 (two) times daily., Disp: 30 g, Rfl: 0   VENTOLIN HFA 108 (90 Base) MCG/ACT inhaler, Inhale 1 puff into the lungs every 4 (four) hours as needed for shortness of breath. (Patient taking differently: Inhale 2 puffs into the lungs every 4 (four) hours as needed for shortness of breath.), Disp: 6.7 g, Rfl: 1   zolpidem (AMBIEN) 10 MG tablet, Take 1 tablet (10 mg total) by mouth at bedtime as needed for sleep., Disp: 30 tablet, Rfl: 0 Allergies  Allergen Reactions   Penicillins Other (See Comments)    Caused convulsions as a child  Did it involve swelling of the face/tongue/throat, SOB, or low BP? N/A Did it involve sudden or severe rash/hives, skin peeling, or any reaction on the inside of your mouth or nose? N/A Did you need to seek medical attention at a hospital or doctor's office?N/A When did it last happen? N/A If all above answers are "NO", may proceed with cephalosporin use.     Social History    Socioeconomic History   Marital status: Single    Spouse name: Not on file   Number of children: Not on file   Years of education: Not on file   Highest education level: Not on file  Occupational History   Not on file  Tobacco Use   Smoking status: Never   Smokeless tobacco: Never  Vaping Use   Vaping Use: Never used  Substance and Sexual Activity   Alcohol use: Not Currently   Drug use: Not Currently    Types: Marijuana    Comment: Smoked for 30 years   Sexual activity: Not on file  Other Topics Concern   Not on file  Social History Narrative   Not on file   Social Determinants of Health   Financial Resource Strain: Medium Risk (11/15/2020)   Overall Financial Resource Strain (CARDIA)    Difficulty of Paying Living Expenses: Somewhat hard  Food Insecurity: No Food Insecurity (03/08/2022)   Hunger Vital Sign    Worried About Running Out of Food in the Last Year: Never true    Ran Out of Food in the Last Year: Never true  Transportation Needs: Unmet Transportation Needs (04/21/2022)   PRAPARE - Administrator, Civil Service (Medical): Yes    Lack of Transportation (Non-Medical): Yes  Physical Activity: Not on file  Stress: Not on file  Social Connections: Not on file  Intimate Partner Violence: Not At Risk (03/08/2022)   Humiliation, Afraid, Rape, and Kick questionnaire    Fear of Current or Ex-Partner: No    Emotionally Abused: No    Physically Abused: No    Sexually Abused: No    Physical Exam      No future appointments.

## 2022-08-17 ENCOUNTER — Telehealth (HOSPITAL_COMMUNITY): Payer: Self-pay

## 2022-08-17 NOTE — Telephone Encounter (Signed)
Charles Gray reached out inquiring if he should be re-checked for his iron levels after having his iron infusion on 07/21/22. He has not had any repeat labs since and is wondering if he should follow up for same. Will forward to HF clinic as Everlean Alstrom was the ordering provider.   Maralyn Sago, EMT-Paramedic [phone] 08/17/2022

## 2022-08-18 ENCOUNTER — Other Ambulatory Visit (HOSPITAL_COMMUNITY): Payer: Self-pay

## 2022-08-18 NOTE — Progress Notes (Signed)
Paramedicine Encounter    Patient ID: Charles Gray, male    DOB: 04-01-1956, 67 y.o.   MRN: [id number]   Arrived for home visit for Charles Gray who reports to not be feeling well. He states he is having headaches, fatigue and some shortness of breath. His legs were wrapped yesterday by [facility] RN. He states he has done okay with the decrease in metolazone- but the days he takes it he is urinating frequently and sometimes urinates on himself due to not making it to the bathroom. I obtained vitals and assessment. Lungs cleared. I reviewed meds and confirmed same filling pill box for two weeks.   Refills as noted will be called into [facility]: -Metoprolol -Pravastatin  Appointments reviewed- he will be seeing his suboxone doctor tomorrow.   I plan to see him in two weeks.   Maralyn Sago, EMT-Paramedic [phone] 08/18/2022        Patient Care Team: Nonnie Done., MD as PCP - General (Family Medicine) Laurey Morale, MD as PCP - Cardiology (Cardiology) [name], LCSW as Social Worker (Licensed Clinical Social Worker)  Patient Active Problem List   Diagnosis Date Noted   Acute on chronic heart failure with preserved ejection fraction (HFpEF) (HCC) 03/04/2022   Dyslipidemia 07/17/2021   Lymphedema 07/08/2021   Melena    Gastric polyps    UGIB (upper gastrointestinal bleed) 04/06/2021   Acute encephalopathy 12/21/2020   COPD (chronic obstructive pulmonary disease) (HCC) 12/21/2020   Chronic diastolic (congestive) heart failure (HCC) 11/14/2020   Syncope 10/28/2020   CHF (congestive heart failure) (HCC) 10/28/2020   Chronic venous stasis dermatitis of both lower extremities 08/27/2020   Cellulitis of both lower extremities 08/14/2020   Iron deficiency anemia 08/14/2020   GERD without esophagitis 06/10/2020   Atrial fibrillation with rapid ventricular response (HCC) 03/24/2020   Acute on chronic right heart failure (HCC) 01/27/2020   Acute respiratory failure with  hypoxia (HCC)    Fatty liver disease, nonalcoholic    Shock (HCC)    Personal history of DVT (deep vein thrombosis) 12/26/2019   Chronic anticoagulation 12/26/2019   Palpitations 12/26/2019   Paroxysmal atrial fibrillation (HCC) 12/23/2019   Heart failure, systolic, acute (HCC) 08/24/2019   Acute on chronic congestive heart failure (HCC)    Acute on chronic diastolic CHF (congestive heart failure) (HCC) 08/23/2019   Chest pain 08/23/2019   Lactic acidosis 08/23/2019   History of pulmonary embolism 08/12/2019   Atrial tachycardia 08/12/2019   Occasional tremors 08/12/2019   OSA (obstructive sleep apnea) 08/12/2019   AKI (acute kidney injury) (HCC) 08/08/2019   Acute on chronic right-sided heart failure (HCC) 08/06/2019   Acute pulmonary embolism (HCC) 06/08/2019   Normocytic anemia 06/08/2019   Pulmonary emboli (HCC) 06/08/2019   Chronic heart failure with preserved ejection fraction (HCC) 05/30/2019   Class 2 severe obesity due to excess calories with serious comorbidity and body mass index (BMI) of 39.0 to 39.9 in adult (HCC) 05/30/2019   Morbid obesity with BMI of 40.0-44.9, adult (HCC) 05/30/2019   Hyponatremia    Drug induced constipation    Peripheral edema    Hypotension due to drugs    Hypoalbuminemia due to protein-calorie malnutrition (HCC)    Thrombocytopenia (HCC)    Anemia of chronic disease    Cirrhosis of liver without ascites (HCC)    Chronic pain syndrome    Debility 03/13/2019   Portal hypertensive gastropathy (HCC) 03/06/2019   Dyspnea    GIB (gastrointestinal bleeding) 03/04/2019  Mixed hyperlipidemia 01/24/2019   Insomnia 01/24/2019   Hyperlipidemia 01/24/2019   Essential hypertension 01/24/2019   Lumbar disc herniation 01/24/2019    Current Outpatient Medications:    apixaban (ELIQUIS) 5 MG TABS tablet, Take 1 tablet (5 mg total) by mouth 2 (two) times daily., Disp: 60 tablet, Rfl: 3   Buprenorphine HCl-Naloxone HCl 8-2 MG FILM, Place 1 Film under  the tongue in the morning and at bedtime., Disp: , Rfl:    eplerenone (INSPRA) 50 MG tablet, Take 2 tablets (100 mg total) by mouth daily., Disp: 60 tablet, Rfl: 6   gabapentin (NEURONTIN) 800 MG tablet, Take 1 tablet (800 mg total) by mouth 3 (three) times daily., Disp: 90 tablet, Rfl: 1   lactulose, encephalopathy, (CHRONULAC) 10 GM/15ML SOLN, Take 30 mLs (20 g total) by mouth 2 (two) times daily., Disp: , Rfl:    metolazone (ZAROXOLYN) 2.5 MG tablet, Take 1 tab, 4days a week on Monday, Wednesday  Fridays and Saturdays, Disp: 30 tablet, Rfl: 0   metoprolol succinate (TOPROL XL) 25 MG 24 hr tablet, Take 1 tablet (25 mg total) by mouth at bedtime., Disp: 30 tablet, Rfl: 11   ondansetron (ZOFRAN-ODT) 4 MG disintegrating tablet, Take 4 mg by mouth every 8 (eight) hours as needed for nausea or vomiting (dissolve orally)., Disp: , Rfl:    pantoprazole (PROTONIX) 40 MG tablet, Take 40 mg by mouth 2 (two) times daily before a meal., Disp: , Rfl:    polyethylene glycol (MIRALAX / GLYCOLAX) 17 g packet, Take 17 g by mouth as needed for mild constipation (mix and drink as directed)., Disp: , Rfl:    potassium chloride (KLOR-CON) 20 MEQ packet, Take 100 mEq by mouth 3 (three) times daily. Take an extra 40 mEq on days when you take metolazone, Disp: 475 packet, Rfl: 3   pravastatin (PRAVACHOL) 20 MG tablet, Take 1 tablet (20 mg total) by mouth daily., Disp: 30 tablet, Rfl: 3   sertraline (ZOLOFT) 50 MG tablet, Take 1 tablet (50 mg total) by mouth daily., Disp: 30 tablet, Rfl: 3   tamsulosin (FLOMAX) 0.4 MG CAPS capsule, Take 1 capsule (0.4 mg total) by mouth daily. Additional refills will need to come from PCP, Disp: 30 capsule, Rfl: 0   torsemide (DEMADEX) 100 MG tablet, TAKE ONE TABLET BY MOUTH DAILY IN THE MORNING AND EVENING, Disp: 90 tablet, Rfl: 1   triamcinolone (KENALOG) 0.025 % ointment, Apply 1 application. topically 2 (two) times daily., Disp: 30 g, Rfl: 0   VENTOLIN HFA 108 (90 Base) MCG/ACT inhaler,  Inhale 1 puff into the lungs every 4 (four) hours as needed for shortness of breath. (Patient taking differently: Inhale 2 puffs into the lungs every 4 (four) hours as needed for shortness of breath.), Disp: 6.7 g, Rfl: 1   zolpidem (AMBIEN) 10 MG tablet, Take 1 tablet (10 mg total) by mouth at bedtime as needed for sleep., Disp: 30 tablet, Rfl: 0 Allergies  Allergen Reactions   Penicillins Other (See Comments)    Caused convulsions as a child  Did it involve swelling of the face/tongue/throat, SOB, or low BP? N/A Did it involve sudden or severe rash/hives, skin peeling, or any reaction on the inside of your mouth or nose? N/A Did you need to seek medical attention at a hospital or doctor's office?N/A When did it last happen? N/A If all above answers are "NO", may proceed with cephalosporin use.     Social History   Socioeconomic History   Marital status:  Single    Spouse name: Not on file   Number of children: Not on file   Years of education: Not on file   Highest education level: Not on file  Occupational History   Not on file  Tobacco Use   Smoking status: Never   Smokeless tobacco: Never  Vaping Use   Vaping Use: Never used  Substance and Sexual Activity   Alcohol use: Not Currently   Drug use: Not Currently    Types: Marijuana    Comment: Smoked for 30 years   Sexual activity: Not on file  Other Topics Concern   Not on file  Social History Narrative   Not on file   Social Determinants of Health   Financial Resource Strain: Medium Risk (11/15/2020)   Overall Financial Resource Strain (CARDIA)    Difficulty of Paying Living Expenses: Somewhat hard  Food Insecurity: No Food Insecurity (03/08/2022)   Hunger Vital Sign    Worried About Running Out of Food in the Last Year: Never true    Ran Out of Food in the Last Year: Never true  Transportation Needs: Unmet Transportation Needs (04/21/2022)   PRAPARE - Administrator, Civil Service (Medical): Yes    Lack  of Transportation (Non-Medical): Yes  Physical Activity: Not on file  Stress: Not on file  Social Connections: Not on file  Intimate Partner Violence: Not At Risk (03/08/2022)   Humiliation, Afraid, Rape, and Kick questionnaire    Fear of Current or Ex-Partner: No    Emotionally Abused: No    Physically Abused: No    Sexually Abused: No    Physical Exam      No future appointments.   ACTION: Home visit completed

## 2022-08-19 DIAGNOSIS — F332 Major depressive disorder, recurrent severe without psychotic features: Secondary | ICD-10-CM | POA: Diagnosis not present

## 2022-08-19 DIAGNOSIS — F411 Generalized anxiety disorder: Secondary | ICD-10-CM | POA: Diagnosis not present

## 2022-08-19 DIAGNOSIS — F1120 Opioid dependence, uncomplicated: Secondary | ICD-10-CM | POA: Diagnosis not present

## 2022-08-26 ENCOUNTER — Telehealth (HOSPITAL_COMMUNITY): Payer: Self-pay

## 2022-08-26 NOTE — Telephone Encounter (Signed)
Contacted [facility] to request the following for refills:   Metoprolol Pravastatin   Kerry Hough, Paramedic [phone] 08/26/22

## 2022-09-01 ENCOUNTER — Other Ambulatory Visit (HOSPITAL_COMMUNITY): Payer: Self-pay

## 2022-09-01 NOTE — Progress Notes (Signed)
Paramedicine Encounter    Patient ID: Charles Gray, male    DOB: November 03, 1955, 67 y.o.   MRN: [id number]   Complaints- Generalized aches and pains, left eye feeling "off" with blurred vision and feeling "droopy".   Assessment- CAOX4, warm and dry seated in his recliner reporting to be feeling poorly which is baseline for him. Eyes equal round and reactive. Both lower legs wrapped in unna boots by RN yesterday. Reports getting more forgetful with tremors.   Compliance with meds- No missed doses of meds in pill boxes over the last two weeks. Admits missed doses of lactulose.   Pill box filled- for two weeks   Refills needed- metolazone   Meds changes since last visit- none     Social changes- none    Arrived for home visit for [facility] who reports feeling poorly with general aches and pains. He also reports lower leg pain and trouble ambulating due to knee pain. This is not new for him as he complains of this often. Lower legs wrapped with unna boots. Vitals obtained as noted. I reviewed meds and confirmed same, filled two pill boxes for two weeks. He reports his moms meals will be ending on Friday as that will be his last delivery. He reports he is needing fitted compression socks- he had a prescription for them but he is unsure where it is, he plans to look for it and if he cannot find it I will see if HF clinic can re-write as wound care wrote the original. We discussed his complaints and the need to follow up with eye doctor and his PCP and he says he doesn't want to at present. I advised him the risks of not following up with them and the need to do so.  CARDIO MEMS- 7   I plan to see Kalab in two weeks he agreed with plan.   Refill- Metolazone   BP 110/70   Pulse 84   Resp 16   Wt (!) 341 lb (154.7 kg)   SpO2 99%   BMI 35.66 kg/m  Weight yesterday-- 340lbs  Last visit weight--341lbs    Maralyn Sago, EMT-Paramedic [phone]  ACTION: Home visit  completed    Patient Care Team: Nonnie Done., MD as PCP - General (Family Medicine) Laurey Morale, MD as PCP - Cardiology (Cardiology) [name], LCSW as Social Worker (Licensed Clinical Social Worker)  Patient Active Problem List   Diagnosis Date Noted   Acute on chronic heart failure with preserved ejection fraction (HFpEF) (HCC) 03/04/2022   Dyslipidemia 07/17/2021   Lymphedema 07/08/2021   Melena    Gastric polyps    UGIB (upper gastrointestinal bleed) 04/06/2021   Acute encephalopathy 12/21/2020   COPD (chronic obstructive pulmonary disease) (HCC) 12/21/2020   Chronic diastolic (congestive) heart failure (HCC) 11/14/2020   Syncope 10/28/2020   CHF (congestive heart failure) (HCC) 10/28/2020   Chronic venous stasis dermatitis of both lower extremities 08/27/2020   Cellulitis of both lower extremities 08/14/2020   Iron deficiency anemia 08/14/2020   GERD without esophagitis 06/10/2020   Atrial fibrillation with rapid ventricular response (HCC) 03/24/2020   Acute on chronic right heart failure (HCC) 01/27/2020   Acute respiratory failure with hypoxia (HCC)    Fatty liver disease, nonalcoholic    Shock (HCC)    Personal history of DVT (deep vein thrombosis) 12/26/2019   Chronic anticoagulation 12/26/2019   Palpitations 12/26/2019   Paroxysmal atrial fibrillation (HCC) 12/23/2019   Heart failure, systolic, acute (HCC) 08/24/2019  Acute on chronic congestive heart failure (HCC)    Acute on chronic diastolic CHF (congestive heart failure) (HCC) 08/23/2019   Chest pain 08/23/2019   Lactic acidosis 08/23/2019   History of pulmonary embolism 08/12/2019   Atrial tachycardia 08/12/2019   Occasional tremors 08/12/2019   OSA (obstructive sleep apnea) 08/12/2019   AKI (acute kidney injury) (HCC) 08/08/2019   Acute on chronic right-sided heart failure (HCC) 08/06/2019   Acute pulmonary embolism (HCC) 06/08/2019   Normocytic anemia 06/08/2019   Pulmonary emboli (HCC)  06/08/2019   Chronic heart failure with preserved ejection fraction (HCC) 05/30/2019   Class 2 severe obesity due to excess calories with serious comorbidity and body mass index (BMI) of 39.0 to 39.9 in adult (HCC) 05/30/2019   Morbid obesity with BMI of 40.0-44.9, adult (HCC) 05/30/2019   Hyponatremia    Drug induced constipation    Peripheral edema    Hypotension due to drugs    Hypoalbuminemia due to protein-calorie malnutrition (HCC)    Thrombocytopenia (HCC)    Anemia of chronic disease    Cirrhosis of liver without ascites (HCC)    Chronic pain syndrome    Debility 03/13/2019   Portal hypertensive gastropathy (HCC) 03/06/2019   Dyspnea    GIB (gastrointestinal bleeding) 03/04/2019   Mixed hyperlipidemia 01/24/2019   Insomnia 01/24/2019   Hyperlipidemia 01/24/2019   Essential hypertension 01/24/2019   Lumbar disc herniation 01/24/2019    Current Outpatient Medications:    apixaban (ELIQUIS) 5 MG TABS tablet, Take 1 tablet (5 mg total) by mouth 2 (two) times daily., Disp: 60 tablet, Rfl: 3   Buprenorphine HCl-Naloxone HCl 8-2 MG FILM, Place 1 Film under the tongue in the morning and at bedtime., Disp: , Rfl:    eplerenone (INSPRA) 50 MG tablet, Take 2 tablets (100 mg total) by mouth daily., Disp: 60 tablet, Rfl: 6   gabapentin (NEURONTIN) 800 MG tablet, Take 1 tablet (800 mg total) by mouth 3 (three) times daily., Disp: 90 tablet, Rfl: 1   lactulose, encephalopathy, (CHRONULAC) 10 GM/15ML SOLN, Take 30 mLs (20 g total) by mouth 2 (two) times daily., Disp: , Rfl:    metolazone (ZAROXOLYN) 2.5 MG tablet, Take 1 tab, 4days a week on Monday, Wednesday  Fridays and Saturdays, Disp: 30 tablet, Rfl: 0   metoprolol succinate (TOPROL XL) 25 MG 24 hr tablet, Take 1 tablet (25 mg total) by mouth at bedtime., Disp: 30 tablet, Rfl: 11   ondansetron (ZOFRAN-ODT) 4 MG disintegrating tablet, Take 4 mg by mouth every 8 (eight) hours as needed for nausea or vomiting (dissolve orally)., Disp: , Rfl:     pantoprazole (PROTONIX) 40 MG tablet, Take 40 mg by mouth 2 (two) times daily before a meal., Disp: , Rfl:    polyethylene glycol (MIRALAX / GLYCOLAX) 17 g packet, Take 17 g by mouth as needed for mild constipation (mix and drink as directed)., Disp: , Rfl:    potassium chloride (KLOR-CON) 20 MEQ packet, Take 100 mEq by mouth 3 (three) times daily. Take an extra 40 mEq on days when you take metolazone, Disp: 475 packet, Rfl: 3   pravastatin (PRAVACHOL) 20 MG tablet, Take 1 tablet (20 mg total) by mouth daily., Disp: 30 tablet, Rfl: 3   sertraline (ZOLOFT) 50 MG tablet, Take 1 tablet (50 mg total) by mouth daily., Disp: 30 tablet, Rfl: 3   tamsulosin (FLOMAX) 0.4 MG CAPS capsule, Take 1 capsule (0.4 mg total) by mouth daily. Additional refills will need to come from PCP,  Disp: 30 capsule, Rfl: 0   torsemide (DEMADEX) 100 MG tablet, TAKE ONE TABLET BY MOUTH DAILY IN THE MORNING AND EVENING, Disp: 90 tablet, Rfl: 1   triamcinolone (KENALOG) 0.025 % ointment, Apply 1 application. topically 2 (two) times daily., Disp: 30 g, Rfl: 0   VENTOLIN HFA 108 (90 Base) MCG/ACT inhaler, Inhale 1 puff into the lungs every 4 (four) hours as needed for shortness of breath. (Patient taking differently: Inhale 2 puffs into the lungs every 4 (four) hours as needed for shortness of breath.), Disp: 6.7 g, Rfl: 1   zolpidem (AMBIEN) 10 MG tablet, Take 1 tablet (10 mg total) by mouth at bedtime as needed for sleep., Disp: 30 tablet, Rfl: 0 Allergies  Allergen Reactions   Penicillins Other (See Comments)    Caused convulsions as a child  Did it involve swelling of the face/tongue/throat, SOB, or low BP? N/A Did it involve sudden or severe rash/hives, skin peeling, or any reaction on the inside of your mouth or nose? N/A Did you need to seek medical attention at a hospital or doctor's office?N/A When did it last happen? N/A If all above answers are "NO", may proceed with cephalosporin use.     Social History    Socioeconomic History   Marital status: Single    Spouse name: Not on file   Number of children: Not on file   Years of education: Not on file   Highest education level: Not on file  Occupational History   Not on file  Tobacco Use   Smoking status: Never   Smokeless tobacco: Never  Vaping Use   Vaping Use: Never used  Substance and Sexual Activity   Alcohol use: Not Currently   Drug use: Not Currently    Types: Marijuana    Comment: Smoked for 30 years   Sexual activity: Not on file  Other Topics Concern   Not on file  Social History Narrative   Not on file   Social Determinants of Health   Financial Resource Strain: Medium Risk (11/15/2020)   Overall Financial Resource Strain (CARDIA)    Difficulty of Paying Living Expenses: Somewhat hard  Food Insecurity: No Food Insecurity (03/08/2022)   Hunger Vital Sign    Worried About Running Out of Food in the Last Year: Never true    Ran Out of Food in the Last Year: Never true  Transportation Needs: Unmet Transportation Needs (04/21/2022)   PRAPARE - Administrator, Civil Service (Medical): Yes    Lack of Transportation (Non-Medical): Yes  Physical Activity: Not on file  Stress: Not on file  Social Connections: Not on file  Intimate Partner Violence: Not At Risk (03/08/2022)   Humiliation, Afraid, Rape, and Kick questionnaire    Fear of Current or Ex-Partner: No    Emotionally Abused: No    Physically Abused: No    Sexually Abused: No    Physical Exam      No future appointments.

## 2022-09-02 DIAGNOSIS — F1120 Opioid dependence, uncomplicated: Secondary | ICD-10-CM | POA: Diagnosis not present

## 2022-09-02 DIAGNOSIS — F411 Generalized anxiety disorder: Secondary | ICD-10-CM | POA: Diagnosis not present

## 2022-09-02 DIAGNOSIS — F332 Major depressive disorder, recurrent severe without psychotic features: Secondary | ICD-10-CM | POA: Diagnosis not present

## 2022-09-08 DIAGNOSIS — I872 Venous insufficiency (chronic) (peripheral): Secondary | ICD-10-CM | POA: Diagnosis not present

## 2022-09-08 DIAGNOSIS — I110 Hypertensive heart disease with heart failure: Secondary | ICD-10-CM | POA: Diagnosis not present

## 2022-09-08 DIAGNOSIS — I5032 Chronic diastolic (congestive) heart failure: Secondary | ICD-10-CM | POA: Diagnosis not present

## 2022-09-08 DIAGNOSIS — I480 Paroxysmal atrial fibrillation: Secondary | ICD-10-CM | POA: Diagnosis not present

## 2022-09-08 DIAGNOSIS — M5126 Other intervertebral disc displacement, lumbar region: Secondary | ICD-10-CM | POA: Diagnosis not present

## 2022-09-08 DIAGNOSIS — I890 Lymphedema, not elsewhere classified: Secondary | ICD-10-CM | POA: Diagnosis not present

## 2022-09-08 DIAGNOSIS — J449 Chronic obstructive pulmonary disease, unspecified: Secondary | ICD-10-CM | POA: Diagnosis not present

## 2022-09-08 DIAGNOSIS — G894 Chronic pain syndrome: Secondary | ICD-10-CM | POA: Diagnosis not present

## 2022-09-08 DIAGNOSIS — D509 Iron deficiency anemia, unspecified: Secondary | ICD-10-CM | POA: Diagnosis not present

## 2022-09-09 ENCOUNTER — Other Ambulatory Visit (HOSPITAL_COMMUNITY): Payer: Self-pay

## 2022-09-09 MED ORDER — PRAVASTATIN SODIUM 20 MG PO TABS: 20.0000 mg | ORAL_TABLET | Freq: Every day | ORAL | 11 refills | Status: DC

## 2022-09-15 ENCOUNTER — Other Ambulatory Visit (HOSPITAL_COMMUNITY): Payer: Self-pay

## 2022-09-15 ENCOUNTER — Telehealth (HOSPITAL_COMMUNITY): Payer: Self-pay

## 2022-09-15 ENCOUNTER — Other Ambulatory Visit (HOSPITAL_COMMUNITY): Payer: Self-pay | Admitting: Cardiology

## 2022-09-15 NOTE — Telephone Encounter (Signed)
Charles Gray misplaced his written prescription for custom compression stockings he was given last year- he is now wanting a new one to go get custom stockings. I advised I would reach out to HF clinic to see if they are able to assist.   Maralyn Sago, EMT-Paramedic [phone] 09/15/2022

## 2022-09-15 NOTE — Progress Notes (Signed)
Paramedicine Encounter    Patient ID: Charles Gray, male    DOB: 05/22/1955, 67 y.o.   MRN: [id number]   Complaints- general body aches and pains, "feels dry"  Assessment- CAOX4, warm and dry seated in recliner. He reports having some general aches and pains. Denied chest pain, dizziness or shortness of breath more than his normal. Legs wrapped by [facility] RN last Thursday- should be coming out again tomorrow. Vitals obtained. Lungs clear. Mucous membranes dry, skin is dry- he reports feeling dry/dehydrated following metolazone yesterday. Cardio [facility] 3 yesterday.   Compliance with meds- no missed doses over last two weeks.   Pill box filled- for two weeks  Refills needed- pravastatin, metoprolol, inspra   Meds changes since last visit- none     Social changes- none   Arrived for home visit [facility] where he was seated in his recliner in his living room CAOx4, warm and dry reporting to be feeling some general aches and pains and fatigue as well as reporting feeling "dry" following him taking his metolazone yesterday. He denied chest pain or dizziness or increased shortness of breath. Vitals obtained as noted. I encouraged him to hydrate well and to monitor his weigh and symptoms and to let me know if anything worsened. He agreed. Legs wrapped by [facility] RN last week- she will return to re-wrap tomorrow. PT comes out today at 1100. He reports weight is 339lbs today. Yesterday was 342lbs. I reviewed meds and confirmed same filling pill box for two weeks. Refills as noted will be called into [facility] tomorrow. He is needing a new RX for custom compression socks- I will see if HF clinic provider can re-write. Home visit complete. I will see Charles Gray in two weeks.    BP (!) 140/72   Pulse 74   Resp 16   Wt (!) 339 lb (153.8 kg)   SpO2 98%   BMI 35.45 kg/m  Weight yesterday-- 342lbs  Last visit weight-- 341lbs    Charles Gray, EMT-Paramedic [phone]  ACTION: Home visit  completed    Patient Care Team: Charles Gray., MD as PCP - General (Family Medicine) Laurey Morale, MD as PCP - Cardiology (Cardiology) [name], LCSW as Social Worker (Licensed Clinical Social Worker)  Patient Active Problem List   Diagnosis Date Noted   Acute on chronic heart failure with preserved ejection fraction (HFpEF) (HCC) 03/04/2022   Dyslipidemia 07/17/2021   Lymphedema 07/08/2021   Melena    Gastric polyps    UGIB (upper gastrointestinal bleed) 04/06/2021   Acute encephalopathy 12/21/2020   COPD (chronic obstructive pulmonary disease) (HCC) 12/21/2020   Chronic diastolic (congestive) heart failure (HCC) 11/14/2020   Syncope 10/28/2020   CHF (congestive heart failure) (HCC) 10/28/2020   Chronic venous stasis dermatitis of both lower extremities 08/27/2020   Cellulitis of both lower extremities 08/14/2020   Iron deficiency anemia 08/14/2020   GERD without esophagitis 06/10/2020   Atrial fibrillation with rapid ventricular response (HCC) 03/24/2020   Acute on chronic right heart failure (HCC) 01/27/2020   Acute respiratory failure with hypoxia (HCC)    Fatty liver disease, nonalcoholic    Shock (HCC)    Personal history of DVT (deep vein thrombosis) 12/26/2019   Chronic anticoagulation 12/26/2019   Palpitations 12/26/2019   Paroxysmal atrial fibrillation (HCC) 12/23/2019   Heart failure, systolic, acute (HCC) 08/24/2019   Acute on chronic congestive heart failure (HCC)    Acute on chronic diastolic CHF (congestive heart failure) (HCC) 08/23/2019  Chest pain 08/23/2019   Lactic acidosis 08/23/2019   History of pulmonary embolism 08/12/2019   Atrial tachycardia 08/12/2019   Occasional tremors 08/12/2019   OSA (obstructive sleep apnea) 08/12/2019   AKI (acute kidney injury) (HCC) 08/08/2019   Acute on chronic right-sided heart failure (HCC) 08/06/2019   Acute pulmonary embolism (HCC) 06/08/2019   Normocytic anemia 06/08/2019   Pulmonary emboli (HCC)  06/08/2019   Chronic heart failure with preserved ejection fraction (HCC) 05/30/2019   Class 2 severe obesity due to excess calories with serious comorbidity and body mass index (BMI) of 39.0 to 39.9 in adult [facility]) 05/30/2019   Morbid obesity with BMI of 40.0-44.9, adult (HCC) 05/30/2019   Hyponatremia    Drug induced constipation    Peripheral edema    Hypotension due to drugs    Hypoalbuminemia due to protein-calorie malnutrition (HCC)    Thrombocytopenia (HCC)    Anemia of chronic disease    Cirrhosis of liver without ascites (HCC)    Chronic pain syndrome    Debility 03/13/2019   Portal hypertensive gastropathy (HCC) 03/06/2019   Dyspnea    GIB (gastrointestinal bleeding) 03/04/2019   Mixed hyperlipidemia 01/24/2019   Insomnia 01/24/2019   Hyperlipidemia 01/24/2019   Essential hypertension 01/24/2019   Lumbar disc herniation 01/24/2019    Current Outpatient Medications:    apixaban (ELIQUIS) 5 MG TABS tablet, Take 1 tablet (5 mg total) by mouth 2 (two) times daily., Disp: 60 tablet, Rfl: 3   Buprenorphine HCl-Naloxone HCl 8-2 MG FILM, Place 1 Film under the tongue in the morning and at bedtime., Disp: , Rfl:    eplerenone (INSPRA) 50 MG tablet, Take 2 tablets (100 mg total) by mouth daily., Disp: 60 tablet, Rfl: 6   gabapentin (NEURONTIN) 800 MG tablet, Take 1 tablet (800 mg total) by mouth 3 (three) times daily., Disp: 90 tablet, Rfl: 1   lactulose, encephalopathy, (CHRONULAC) 10 GM/15ML SOLN, Take 30 mLs (20 g total) by mouth 2 (two) times daily., Disp: , Rfl:    metolazone (ZAROXOLYN) 2.5 MG tablet, Take 1 tab, 4days a week on Monday, Wednesday  Fridays and Saturdays, Disp: 30 tablet, Rfl: 0   metoprolol succinate (TOPROL XL) 25 MG 24 hr tablet, Take 1 tablet (25 mg total) by mouth at bedtime., Disp: 30 tablet, Rfl: 11   ondansetron (ZOFRAN-ODT) 4 MG disintegrating tablet, Take 4 mg by mouth every 8 (eight) hours as needed for nausea or vomiting (dissolve orally)., Disp: , Rfl:     pantoprazole (PROTONIX) 40 MG tablet, Take 40 mg by mouth 2 (two) times daily before a meal., Disp: , Rfl:    polyethylene glycol (MIRALAX / GLYCOLAX) 17 g packet, Take 17 g by mouth as needed for mild constipation (mix and drink as directed)., Disp: , Rfl:    potassium chloride (KLOR-CON) 20 MEQ packet, Take 100 mEq by mouth 3 (three) times daily. Take an extra 40 mEq on days when you take metolazone, Disp: 475 packet, Rfl: 3   pravastatin (PRAVACHOL) 20 MG tablet, Take 1 tablet (20 mg total) by mouth daily., Disp: 30 tablet, Rfl: 11   sertraline (ZOLOFT) 50 MG tablet, Take 1 tablet (50 mg total) by mouth daily., Disp: 30 tablet, Rfl: 3   tamsulosin (FLOMAX) 0.4 MG CAPS capsule, Take 1 capsule (0.4 mg total) by mouth daily. Additional refills will need to come from PCP, Disp: 30 capsule, Rfl: 0   torsemide (DEMADEX) 100 MG tablet, TAKE ONE TABLET BY MOUTH DAILY IN THE MORNING AND  EVENING, Disp: 90 tablet, Rfl: 3   triamcinolone (KENALOG) 0.025 % ointment, Apply 1 application. topically 2 (two) times daily., Disp: 30 g, Rfl: 0   VENTOLIN HFA 108 (90 Base) MCG/ACT inhaler, Inhale 1 puff into the lungs every 4 (four) hours as needed for shortness of breath. (Patient taking differently: Inhale 2 puffs into the lungs every 4 (four) hours as needed for shortness of breath.), Disp: 6.7 g, Rfl: 1   zolpidem (AMBIEN) 10 MG tablet, Take 1 tablet (10 mg total) by mouth at bedtime as needed for sleep., Disp: 30 tablet, Rfl: 0 Allergies  Allergen Reactions   Penicillins Other (See Comments)    Caused convulsions as a child  Did it involve swelling of the face/tongue/throat, SOB, or low BP? N/A Did it involve sudden or severe rash/hives, skin peeling, or any reaction on the inside of your mouth or nose? N/A Did you need to seek medical attention at a hospital or doctor's office?N/A When did it last happen? N/A If all above answers are "NO", may proceed with cephalosporin use.     Social History    Socioeconomic History   Marital status: Single    Spouse name: Not on file   Number of children: Not on file   Years of education: Not on file   Highest education level: Not on file  Occupational History   Not on file  Tobacco Use   Smoking status: Never   Smokeless tobacco: Never  Vaping Use   Vaping Use: Never used  Substance and Sexual Activity   Alcohol use: Not Currently   Drug use: Not Currently    Types: Marijuana    Comment: Smoked for 30 years   Sexual activity: Not on file  Other Topics Concern   Not on file  Social History Narrative   Not on file   Social Determinants of Health   Financial Resource Strain: Medium Risk (11/15/2020)   Overall Financial Resource Strain (CARDIA)    Difficulty of Paying Living Expenses: Somewhat hard  Food Insecurity: No Food Insecurity (03/08/2022)   Hunger Vital Sign    Worried About Running Out of Food in the Last Year: Never true    Ran Out of Food in the Last Year: Never true  Transportation Needs: Unmet Transportation Needs (04/21/2022)   PRAPARE - Administrator, Civil Service (Medical): Yes    Lack of Transportation (Non-Medical): Yes  Physical Activity: Not on file  Stress: Not on file  Social Connections: Not on file  Intimate Partner Violence: Not At Risk (03/08/2022)   Humiliation, Afraid, Rape, and Kick questionnaire    Fear of Current or Ex-Partner: No    Emotionally Abused: No    Physically Abused: No    Sexually Abused: No    Physical Exam      No future appointments.

## 2022-09-16 DIAGNOSIS — F411 Generalized anxiety disorder: Secondary | ICD-10-CM | POA: Diagnosis not present

## 2022-09-16 DIAGNOSIS — F332 Major depressive disorder, recurrent severe without psychotic features: Secondary | ICD-10-CM | POA: Diagnosis not present

## 2022-09-16 DIAGNOSIS — F1120 Opioid dependence, uncomplicated: Secondary | ICD-10-CM | POA: Diagnosis not present

## 2022-09-19 ENCOUNTER — Other Ambulatory Visit (HOSPITAL_COMMUNITY): Payer: Self-pay | Admitting: Cardiology

## 2022-09-29 ENCOUNTER — Other Ambulatory Visit (HOSPITAL_COMMUNITY): Payer: Self-pay | Admitting: Cardiology

## 2022-09-29 ENCOUNTER — Other Ambulatory Visit (HOSPITAL_COMMUNITY): Payer: Self-pay

## 2022-09-29 DIAGNOSIS — I5032 Chronic diastolic (congestive) heart failure: Secondary | ICD-10-CM

## 2022-09-29 NOTE — Progress Notes (Signed)
Paramedicine Encounter    Patient ID: Charles Gray, male    DOB: 02/07/1956, 67 y.o.   MRN: [id number]   Complaints and Assessment- Charles Gray reports he had a fall in the bathroom over the weekend where he hit his face on the wall, striking his head, neck, back and sides. He is in pain all over from same. Bruising noted to right and left eye, left elbow, and left flank area. He reports general fatigue and some dizziness, he also complains of some constipation with two stools noting to have dark red blood two days ago but says he has not had many bowel movements since.    Compliance with meds- no missed doses   Pill box filled- filled for two weeks   Refills needed- gabapentin, metolazone, inspra   Meds changes since last visit- none     Social changes- none   Arrived for home visit for Charles Gray who reports having a fall last week in the bathroom on Thursday after getting up from the toilet and slipping on a bathroom mat. He reports the above complaints. Injuries as noted. He states [employer] came out and evaluated him and urged him to go to the ER but he refused upon their assessment. They assisted him up into his chair and his vitals were within normal limits and he refused transport. Today he is ambulatory with his walker. No increased shortness of breath noted- more than normal. He reports that he is very sore but, knows if things worsen to call EMS for transport. He mentioned dark red blood in his stool two days ago with on going constipation, I advised him based on that alone he should be evaluated and he verbalized agreement.   I obtained vitals and assessment. Lungs clear, pupils round, equal and reactive. Bruising is improving since Thursdays fall- he had pictures we compared to. We reviewed meds and confirmed same. I filled pill box for two weeks. We reviewed upcoming appointments. He sees Dr. Orvan Falconer tomorrow at 3:15. He is wearing compression stockings and is no longer receiving  leg wraps from [facility]. He has a prescription from Dr. Shirlee Latch to get custom compression hose. He understands he should get this done ASAP. He is still struggling with depression with the relationship with his son who doesn't speak to him much and the recent loss of a long time partner. I offered encouragement and resources. He denied any resources needed at this time. Home visit complete. I will see Charles Gray in two weeks.   MEMS today- 4  MEMS yesterday- 5    There were no vitals taken for this visit. Weight yesterday-- 338lbs Last visit weight-- 339lbs   Maralyn Sago, EMT-Paramedic [phone]  ACTION: Home visit completed    Patient Care Team: Nonnie Done., MD as PCP - General (Family Medicine) Laurey Morale, MD as PCP - Cardiology (Cardiology) [name], LCSW as Social Worker (Licensed Clinical Social Worker)  Patient Active Problem List   Diagnosis Date Noted   Acute on chronic heart failure with preserved ejection fraction (HFpEF) (HCC) 03/04/2022   Dyslipidemia 07/17/2021   Lymphedema 07/08/2021   Melena    Gastric polyps    UGIB (upper gastrointestinal bleed) 04/06/2021   Acute encephalopathy 12/21/2020   COPD (chronic obstructive pulmonary disease) (HCC) 12/21/2020   Chronic diastolic (congestive) heart failure (HCC) 11/14/2020   Syncope 10/28/2020   CHF (congestive heart failure) (HCC) 10/28/2020   Chronic venous stasis dermatitis of both lower extremities 08/27/2020   Cellulitis  of both lower extremities 08/14/2020   Iron deficiency anemia 08/14/2020   GERD without esophagitis 06/10/2020   Atrial fibrillation with rapid ventricular response (HCC) 03/24/2020   Acute on chronic right heart failure (HCC) 01/27/2020   Acute respiratory failure with hypoxia (HCC)    Fatty liver disease, nonalcoholic    Shock (HCC)    Personal history of DVT (deep vein thrombosis) 12/26/2019   Chronic anticoagulation 12/26/2019   Palpitations 12/26/2019   Paroxysmal atrial  fibrillation (HCC) 12/23/2019   Heart failure, systolic, acute (HCC) 08/24/2019   Acute on chronic congestive heart failure (HCC)    Acute on chronic diastolic CHF (congestive heart failure) (HCC) 08/23/2019   Chest pain 08/23/2019   Lactic acidosis 08/23/2019   History of pulmonary embolism 08/12/2019   Atrial tachycardia 08/12/2019   Occasional tremors 08/12/2019   OSA (obstructive sleep apnea) 08/12/2019   AKI (acute kidney injury) (HCC) 08/08/2019   Acute on chronic right-sided heart failure (HCC) 08/06/2019   Acute pulmonary embolism (HCC) 06/08/2019   Normocytic anemia 06/08/2019   Pulmonary emboli (HCC) 06/08/2019   Chronic heart failure with preserved ejection fraction (HCC) 05/30/2019   Class 2 severe obesity due to excess calories with serious comorbidity and body mass index (BMI) of 39.0 to 39.9 in adult (HCC) 05/30/2019   Morbid obesity with BMI of 40.0-44.9, adult (HCC) 05/30/2019   Hyponatremia    Drug induced constipation    Peripheral edema    Hypotension due to drugs    Hypoalbuminemia due to protein-calorie malnutrition (HCC)    Thrombocytopenia (HCC)    Anemia of chronic disease    Cirrhosis of liver without ascites (HCC)    Chronic pain syndrome    Debility 03/13/2019   Portal hypertensive gastropathy (HCC) 03/06/2019   Dyspnea    GIB (gastrointestinal bleeding) 03/04/2019   Mixed hyperlipidemia 01/24/2019   Insomnia 01/24/2019   Hyperlipidemia 01/24/2019   Essential hypertension 01/24/2019   Lumbar disc herniation 01/24/2019    Current Outpatient Medications:    apixaban (ELIQUIS) 5 MG TABS tablet, Take 1 tablet (5 mg total) by mouth 2 (two) times daily., Disp: 60 tablet, Rfl: 3   Buprenorphine HCl-Naloxone HCl 8-2 MG FILM, Place 1 Film under the tongue in the morning and at bedtime., Disp: , Rfl:    eplerenone (INSPRA) 50 MG tablet, Take 2 tablets (100 mg total) by mouth daily., Disp: 60 tablet, Rfl: 6   gabapentin (NEURONTIN) 800 MG tablet, Take 1  tablet (800 mg total) by mouth 3 (three) times daily., Disp: 90 tablet, Rfl: 1   lactulose, encephalopathy, (CHRONULAC) 10 GM/15ML SOLN, Take 30 mLs (20 g total) by mouth 2 (two) times daily., Disp: , Rfl:    metolazone (ZAROXOLYN) 2.5 MG tablet, Take 1 tab, 4days a week on Monday, Wednesday  Fridays and Saturdays, Disp: 30 tablet, Rfl: 0   metoprolol succinate (TOPROL XL) 25 MG 24 hr tablet, Take 1 tablet (25 mg total) by mouth at bedtime., Disp: 30 tablet, Rfl: 11   ondansetron (ZOFRAN-ODT) 4 MG disintegrating tablet, Take 4 mg by mouth every 8 (eight) hours as needed for nausea or vomiting (dissolve orally)., Disp: , Rfl:    pantoprazole (PROTONIX) 40 MG tablet, Take 40 mg by mouth 2 (two) times daily before a meal., Disp: , Rfl:    polyethylene glycol (MIRALAX / GLYCOLAX) 17 g packet, Take 17 g by mouth as needed for mild constipation (mix and drink as directed)., Disp: , Rfl:    potassium chloride (KLOR-CON) 20  MEQ packet, Take 100 mEq by mouth 3 (three) times daily. Take an extra 40 mEq on days when you take metolazone, Disp: 475 packet, Rfl: 3   pravastatin (PRAVACHOL) 20 MG tablet, Take 1 tablet (20 mg total) by mouth daily., Disp: 30 tablet, Rfl: 11   sertraline (ZOLOFT) 50 MG tablet, Take 1 tablet (50 mg total) by mouth daily., Disp: 30 tablet, Rfl: 3   tamsulosin (FLOMAX) 0.4 MG CAPS capsule, Take 1 capsule (0.4 mg total) by mouth daily. Additional refills will need to come from PCP, Disp: 30 capsule, Rfl: 0   torsemide (DEMADEX) 100 MG tablet, TAKE ONE TABLET BY MOUTH DAILY IN THE MORNING AND EVENING, Disp: 90 tablet, Rfl: 3   triamcinolone (KENALOG) 0.025 % ointment, Apply 1 application. topically 2 (two) times daily., Disp: 30 g, Rfl: 0   VENTOLIN HFA 108 (90 Base) MCG/ACT inhaler, Inhale 1 puff into the lungs every 4 (four) hours as needed for shortness of breath. (Patient taking differently: Inhale 2 puffs into the lungs every 4 (four) hours as needed for shortness of breath.), Disp: 6.7  g, Rfl: 1   zolpidem (AMBIEN) 10 MG tablet, Take 1 tablet (10 mg total) by mouth at bedtime as needed for sleep., Disp: 30 tablet, Rfl: 0 Allergies  Allergen Reactions   Penicillins Other (See Comments)    Caused convulsions as a child  Did it involve swelling of the face/tongue/throat, SOB, or low BP? N/A Did it involve sudden or severe rash/hives, skin peeling, or any reaction on the inside of your mouth or nose? N/A Did you need to seek medical attention at a hospital or doctor's office?N/A When did it last happen? N/A If all above answers are "NO", may proceed with cephalosporin use.     Social History   Socioeconomic History   Marital status: Single    Spouse name: Not on file   Number of children: Not on file   Years of education: Not on file   Highest education level: Not on file  Occupational History   Not on file  Tobacco Use   Smoking status: Never   Smokeless tobacco: Never  Vaping Use   Vaping Use: Never used  Substance and Sexual Activity   Alcohol use: Not Currently   Drug use: Not Currently    Types: Marijuana    Comment: Smoked for 30 years   Sexual activity: Not on file  Other Topics Concern   Not on file  Social History Narrative   Not on file   Social Determinants of Health   Financial Resource Strain: Medium Risk (11/15/2020)   Overall Financial Resource Strain (CARDIA)    Difficulty of Paying Living Expenses: Somewhat hard  Food Insecurity: No Food Insecurity (03/08/2022)   Hunger Vital Sign    Worried About Running Out of Food in the Last Year: Never true    Ran Out of Food in the Last Year: Never true  Transportation Needs: Unmet Transportation Needs (04/21/2022)   PRAPARE - Administrator, Civil Service (Medical): Yes    Lack of Transportation (Non-Medical): Yes  Physical Activity: Not on file  Stress: Not on file  Social Connections: Not on file  Intimate Partner Violence: Not At Risk (03/08/2022)   Humiliation, Afraid, Rape,  and Kick questionnaire    Fear of Current or Ex-Partner: No    Emotionally Abused: No    Physically Abused: No    Sexually Abused: No    Physical Exam  No future appointments.

## 2022-09-30 DIAGNOSIS — F411 Generalized anxiety disorder: Secondary | ICD-10-CM | POA: Diagnosis not present

## 2022-09-30 DIAGNOSIS — F332 Major depressive disorder, recurrent severe without psychotic features: Secondary | ICD-10-CM | POA: Diagnosis not present

## 2022-09-30 DIAGNOSIS — F1120 Opioid dependence, uncomplicated: Secondary | ICD-10-CM | POA: Diagnosis not present

## 2022-10-07 ENCOUNTER — Other Ambulatory Visit (HOSPITAL_COMMUNITY): Payer: Self-pay | Admitting: Family Medicine

## 2022-10-13 ENCOUNTER — Other Ambulatory Visit (HOSPITAL_COMMUNITY): Payer: Self-pay

## 2022-10-13 NOTE — Progress Notes (Signed)
Paramedicine Encounter    Patient ID: Charles Gray, male    DOB: 04/10/1956, 67 y.o.   MRN: [id number]   Complaints - Continual head and flank pain secondary to 2x falls last week. Red sores forming on his legs with swelling. Reports present potassium dose makes him feel dry. Advised that Metolozone makes him shake and not feel well.   Assessment - Pt presented with severe bilateral edema, but compliant with compression socks. Pt reports red sores forming on his lower leg, redish rash noted above compression sock. Pt sluggish due to inc pain, but moving around well.   Compliance with meds - Presume non-compliance with potassium due to amount left in bottle, prefers to take them on his own outside of his pill box.   Pill box filled - box filled for 2x weeks.   Refills needed - Metoprolol, pravastatin, sertraline.   Meds changes since last visit - none    Social changes - none    Last visit weight- - 347   Arrived to find pt reclined with his legs elevated in his recliner at home. Pt reported that he was not feeling well today. Pt reports 2x falls in the last week. Injuring his face and flank. Pt reports continual pain from fall and decreased movement due to same, but improvement since his last outreach. I noted that pt had a significant amount of potassium left in his bottle and questioned him on his recent compliance. Pt advised that he would take them out of the bottle as prescribed and stated that he had not been as compliant due to the potassium making him feel dry. Pt also reports symptoms with metolozone and advised that he has an adverse reaction with shakes and ill feeling on the mornings he takes them. Pt asked for a possible alternative. I noted Chow Mein/ Ramen Noodles in pt's pantry and advised him of the excessive salt concentration in that meal. Pt was advised to avoid or eat in moderation and to be mindful of his sodium intake. Pt verbalized understanding. Pt reported that PT  was coming today to help with mobility. Pt was informed of upcoming appointments. Pt's pill box was filled x2 weeks. Will follow up in 2x weeks.      Maralyn Sago, EMT-Paramedic [phone]  ACTION: Home visit completed    Patient Care Team: Nonnie Done., MD as PCP - General (Family Medicine) Laurey Morale, MD as PCP - Cardiology (Cardiology) [name], LCSW as Social Worker (Licensed Clinical Social Worker)  Patient Active Problem List   Diagnosis Date Noted   Acute on chronic heart failure with preserved ejection fraction (HFpEF) (HCC) 03/04/2022   Dyslipidemia 07/17/2021   Lymphedema 07/08/2021   Melena    Gastric polyps    UGIB (upper gastrointestinal bleed) 04/06/2021   Acute encephalopathy 12/21/2020   COPD (chronic obstructive pulmonary disease) (HCC) 12/21/2020   Chronic diastolic (congestive) heart failure (HCC) 11/14/2020   Syncope 10/28/2020   CHF (congestive heart failure) (HCC) 10/28/2020   Chronic venous stasis dermatitis of both lower extremities 08/27/2020   Cellulitis of both lower extremities 08/14/2020   Iron deficiency anemia 08/14/2020   GERD without esophagitis 06/10/2020   Atrial fibrillation with rapid ventricular response (HCC) 03/24/2020   Acute on chronic right heart failure (HCC) 01/27/2020   Acute respiratory failure with hypoxia (HCC)    Fatty liver disease, nonalcoholic    Shock (HCC)    Personal history of DVT (deep vein thrombosis) 12/26/2019   Chronic  anticoagulation 12/26/2019   Palpitations 12/26/2019   Paroxysmal atrial fibrillation (HCC) 12/23/2019   Heart failure, systolic, acute (HCC) 08/24/2019   Acute on chronic congestive heart failure (HCC)    Acute on chronic diastolic CHF (congestive heart failure) (HCC) 08/23/2019   Chest pain 08/23/2019   Lactic acidosis 08/23/2019   History of pulmonary embolism 08/12/2019   Atrial tachycardia 08/12/2019   Occasional tremors 08/12/2019   OSA (obstructive sleep apnea)  08/12/2019   AKI (acute kidney injury) (HCC) 08/08/2019   Acute on chronic right-sided heart failure (HCC) 08/06/2019   Acute pulmonary embolism (HCC) 06/08/2019   Normocytic anemia 06/08/2019   Pulmonary emboli (HCC) 06/08/2019   Chronic heart failure with preserved ejection fraction (HCC) 05/30/2019   Class 2 severe obesity due to excess calories with serious comorbidity and body mass index (BMI) of 39.0 to 39.9 in adult (HCC) 05/30/2019   Morbid obesity with BMI of 40.0-44.9, adult (HCC) 05/30/2019   Hyponatremia    Drug induced constipation    Peripheral edema    Hypotension due to drugs    Hypoalbuminemia due to protein-calorie malnutrition (HCC)    Thrombocytopenia (HCC)    Anemia of chronic disease    Cirrhosis of liver without ascites (HCC)    Chronic pain syndrome    Debility 03/13/2019   Portal hypertensive gastropathy (HCC) 03/06/2019   Dyspnea    GIB (gastrointestinal bleeding) 03/04/2019   Mixed hyperlipidemia 01/24/2019   Insomnia 01/24/2019   Hyperlipidemia 01/24/2019   Essential hypertension 01/24/2019   Lumbar disc herniation 01/24/2019    Current Outpatient Medications:    apixaban (ELIQUIS) 5 MG TABS tablet, Take 1 tablet (5 mg total) by mouth 2 (two) times daily., Disp: 60 tablet, Rfl: 3   Buprenorphine HCl-Naloxone HCl 8-2 MG FILM, Place 1 Film under the tongue in the morning and at bedtime., Disp: , Rfl:    eplerenone (INSPRA) 50 MG tablet, Take 2 tablets (100 mg total) by mouth daily., Disp: 60 tablet, Rfl: 11   gabapentin (NEURONTIN) 800 MG tablet, Take 1 tablet (800 mg total) by mouth 3 (three) times daily., Disp: 90 tablet, Rfl: 1   lactulose, encephalopathy, (CHRONULAC) 10 GM/15ML SOLN, Take 30 mLs (20 g total) by mouth 2 (two) times daily., Disp: , Rfl:    metolazone (ZAROXOLYN) 2.5 MG tablet, TAKE ONE TABLET BY MOUTH FOUR DAYS a WEEK. (Monday, Wednesday, Friday, Saturday), Disp: 30 tablet, Rfl: 0   metoprolol succinate (TOPROL XL) 25 MG 24 hr tablet,  Take 1 tablet (25 mg total) by mouth at bedtime., Disp: 30 tablet, Rfl: 11   ondansetron (ZOFRAN-ODT) 4 MG disintegrating tablet, Take 4 mg by mouth every 8 (eight) hours as needed for nausea or vomiting (dissolve orally)., Disp: , Rfl:    pantoprazole (PROTONIX) 40 MG tablet, Take 40 mg by mouth 2 (two) times daily before a meal., Disp: , Rfl:    polyethylene glycol (MIRALAX / GLYCOLAX) 17 g packet, Take 17 g by mouth as needed for mild constipation (mix and drink as directed)., Disp: , Rfl:    potassium chloride (KLOR-CON) 20 MEQ packet, Take 100 mEq by mouth 3 (three) times daily. Take an extra 40 mEq on days when you take metolazone, Disp: 475 packet, Rfl: 3   pravastatin (PRAVACHOL) 20 MG tablet, Take 1 tablet (20 mg total) by mouth daily., Disp: 30 tablet, Rfl: 11   sertraline (ZOLOFT) 50 MG tablet, Take 1 tablet (50 mg total) by mouth daily., Disp: 30 tablet, Rfl: 3  tamsulosin (FLOMAX) 0.4 MG CAPS capsule, Take 1 capsule (0.4 mg total) by mouth daily. Additional refills will need to come from PCP, Disp: 30 capsule, Rfl: 0   torsemide (DEMADEX) 100 MG tablet, TAKE ONE TABLET BY MOUTH DAILY IN THE MORNING AND EVENING, Disp: 90 tablet, Rfl: 3   triamcinolone (KENALOG) 0.025 % ointment, Apply 1 application. topically 2 (two) times daily., Disp: 30 g, Rfl: 0   VENTOLIN HFA 108 (90 Base) MCG/ACT inhaler, Inhale 1 puff into the lungs every 4 (four) hours as needed for shortness of breath. (Patient taking differently: Inhale 2 puffs into the lungs every 4 (four) hours as needed for shortness of breath.), Disp: 6.7 g, Rfl: 1   zolpidem (AMBIEN) 10 MG tablet, Take 1 tablet (10 mg total) by mouth at bedtime as needed for sleep., Disp: 30 tablet, Rfl: 0 Allergies  Allergen Reactions   Penicillins Other (See Comments)    Caused convulsions as a child  Did it involve swelling of the face/tongue/throat, SOB, or low BP? N/A Did it involve sudden or severe rash/hives, skin peeling, or any reaction on the  inside of your mouth or nose? N/A Did you need to seek medical attention at a hospital or doctor's office?N/A When did it last happen? N/A If all above answers are "NO", may proceed with cephalosporin use.     Social History   Socioeconomic History   Marital status: Single    Spouse name: Not on file   Number of children: Not on file   Years of education: Not on file   Highest education level: Not on file  Occupational History   Not on file  Tobacco Use   Smoking status: Never   Smokeless tobacco: Never  Vaping Use   Vaping Use: Never used  Substance and Sexual Activity   Alcohol use: Not Currently   Drug use: Not Currently    Types: Marijuana    Comment: Smoked for 30 years   Sexual activity: Not on file  Other Topics Concern   Not on file  Social History Narrative   Not on file   Social Determinants of Health   Financial Resource Strain: Medium Risk (11/15/2020)   Overall Financial Resource Strain (CARDIA)    Difficulty of Paying Living Expenses: Somewhat hard  Food Insecurity: No Food Insecurity (03/08/2022)   Hunger Vital Sign    Worried About Running Out of Food in the Last Year: Never true    Ran Out of Food in the Last Year: Never true  Transportation Needs: Unmet Transportation Needs (04/21/2022)   PRAPARE - Administrator, Civil Service (Medical): Yes    Lack of Transportation (Non-Medical): Yes  Physical Activity: Not on file  Stress: Not on file  Social Connections: Not on file  Intimate Partner Violence: Not At Risk (03/08/2022)   Humiliation, Afraid, Rape, and Kick questionnaire    Fear of Current or Ex-Partner: No    Emotionally Abused: No    Physically Abused: No    Sexually Abused: No    Physical Exam      Future Appointments  Date Time Provider Department Center  11/13/2022  8:40 AM Laurey Morale, MD [facility] None

## 2022-10-21 DIAGNOSIS — F411 Generalized anxiety disorder: Secondary | ICD-10-CM | POA: Diagnosis not present

## 2022-10-21 DIAGNOSIS — F1120 Opioid dependence, uncomplicated: Secondary | ICD-10-CM | POA: Diagnosis not present

## 2022-10-21 DIAGNOSIS — F332 Major depressive disorder, recurrent severe without psychotic features: Secondary | ICD-10-CM | POA: Diagnosis not present

## 2022-10-21 DIAGNOSIS — Z79891 Long term (current) use of opiate analgesic: Secondary | ICD-10-CM | POA: Diagnosis not present

## 2022-10-27 ENCOUNTER — Other Ambulatory Visit (HOSPITAL_COMMUNITY): Payer: Self-pay

## 2022-10-27 NOTE — Progress Notes (Signed)
Paramedicine Encounter    Patient ID: Charles Gray, male    DOB: 1955/04/24, 67 y.o.   MRN: [id number]   Complaints - right hip/back/leg/knee pain. Reports soreness with ROM since physical therapy and recent fall.  Floaters in vision.  Incontinence   Assessment - CAOx4, warm and dry, denied shortness of breath, chest pain, or dizziness. No obvious deformity, redness, swelling to point of pain on R hip. Pt ambulatory with assistance of walker.   Compliance with meds - Pt reports taking 1/2 a metolozone about 1x per week if he's, "Feeling dry".  Pill box filled - 2 weeks   Refills needed - Inspra. Gabapentin, torsemide  Meds changes since last visit - none   Social changes - none   BP 124/80   Pulse 73   Resp 16   Wt (!) 331 lb (150.1 kg)   SpO2 98%   BMI 34.61 kg/m  Weight last week: 327 Last visit weight- 339   Narrative: Met with Jeptha in the home. Pt reported pain in his right hip, spanning from his knee to his back. Pt reported pain since Physical Therapy came out and reports that he lost his balance due to the pain and did have a "Fall" into the TV stand, striking his right hip. Pt reports difficulty getting around, but is able to do so without intervention. Pt was advised to use tylenol as needed for pain as written on the bottle. Pt was also encouraged to follow up with PCP for further evaluation. Pt refused to follow up, stating that it wasn't that critical. Pt also reports "floaters" in his vision, reporting that he has had them for over a year. Pt denied having an ophthalmologist for the last 35 years and advised that he did not want to follow up with one now for complaint. Pt has rescheduled 2x appointment with the [facility], advising that he keeps delaying due to concerns about incontinence. Pt was encouraged to utilize [facility] for the meantime and advised that he could be evaluated further for same if he was able to come to appointments. Pt denied any Shortness of breath,  chest pain, or dizziness at time of event and had no further complaints. Pt's pill box for 2x weeks. All upcoming appointments reviewed, Will follow up in 2x weeks. Home Visit Complete.    Patient Care Team: Nonnie Done., MD as PCP - General (Family Medicine) Laurey Morale, MD as PCP - Cardiology (Cardiology) [name], LCSW as Social Worker (Licensed Clinical Social Worker)  Patient Active Problem List   Diagnosis Date Noted   Acute on chronic heart failure with preserved ejection fraction (HFpEF) (HCC) 03/04/2022   Dyslipidemia 07/17/2021   Lymphedema 07/08/2021   Melena    Gastric polyps    UGIB (upper gastrointestinal bleed) 04/06/2021   Acute encephalopathy 12/21/2020   COPD (chronic obstructive pulmonary disease) (HCC) 12/21/2020   Chronic diastolic (congestive) heart failure (HCC) 11/14/2020   Syncope 10/28/2020   CHF (congestive heart failure) (HCC) 10/28/2020   Chronic venous stasis dermatitis of both lower extremities 08/27/2020   Cellulitis of both lower extremities 08/14/2020   Iron deficiency anemia 08/14/2020   GERD without esophagitis 06/10/2020   Atrial fibrillation with rapid ventricular response (HCC) 03/24/2020   Acute on chronic right heart failure (HCC) 01/27/2020   Acute respiratory failure with hypoxia (HCC)    Fatty liver disease, nonalcoholic    Shock (HCC)    Personal history of DVT (deep vein thrombosis) 12/26/2019  Chronic anticoagulation 12/26/2019   Palpitations 12/26/2019   Paroxysmal atrial fibrillation (HCC) 12/23/2019   Heart failure, systolic, acute (HCC) 08/24/2019   Acute on chronic congestive heart failure (HCC)    Acute on chronic diastolic CHF (congestive heart failure) (HCC) 08/23/2019   Chest pain 08/23/2019   Lactic acidosis 08/23/2019   History of pulmonary embolism 08/12/2019   Atrial tachycardia 08/12/2019   Occasional tremors 08/12/2019   OSA (obstructive sleep apnea) 08/12/2019   AKI (acute kidney injury) (HCC)  08/08/2019   Acute on chronic right-sided heart failure (HCC) 08/06/2019   Acute pulmonary embolism (HCC) 06/08/2019   Normocytic anemia 06/08/2019   Pulmonary emboli (HCC) 06/08/2019   Chronic heart failure with preserved ejection fraction (HCC) 05/30/2019   Class 2 severe obesity due to excess calories with serious comorbidity and body mass index (BMI) of 39.0 to 39.9 in adult (HCC) 05/30/2019   Morbid obesity with BMI of 40.0-44.9, adult (HCC) 05/30/2019   Hyponatremia    Drug induced constipation    Peripheral edema    Hypotension due to drugs    Hypoalbuminemia due to protein-calorie malnutrition (HCC)    Thrombocytopenia (HCC)    Anemia of chronic disease    Cirrhosis of liver without ascites (HCC)    Chronic pain syndrome    Debility 03/13/2019   Portal hypertensive gastropathy (HCC) 03/06/2019   Dyspnea    GIB (gastrointestinal bleeding) 03/04/2019   Mixed hyperlipidemia 01/24/2019   Insomnia 01/24/2019   Hyperlipidemia 01/24/2019   Essential hypertension 01/24/2019   Lumbar disc herniation 01/24/2019    Current Outpatient Medications:    apixaban (ELIQUIS) 5 MG TABS tablet, Take 1 tablet (5 mg total) by mouth 2 (two) times daily., Disp: 60 tablet, Rfl: 3   Buprenorphine HCl-Naloxone HCl 8-2 MG FILM, Place 1 Film under the tongue in the morning and at bedtime., Disp: , Rfl:    eplerenone (INSPRA) 50 MG tablet, Take 2 tablets (100 mg total) by mouth daily., Disp: 60 tablet, Rfl: 11   gabapentin (NEURONTIN) 800 MG tablet, Take 1 tablet (800 mg total) by mouth 3 (three) times daily., Disp: 90 tablet, Rfl: 1   lactulose, encephalopathy, (CHRONULAC) 10 GM/15ML SOLN, Take 30 mLs (20 g total) by mouth 2 (two) times daily., Disp: , Rfl:    metolazone (ZAROXOLYN) 2.5 MG tablet, TAKE ONE TABLET BY MOUTH FOUR DAYS a WEEK. (Monday, Wednesday, Friday, Saturday), Disp: 30 tablet, Rfl: 0   metoprolol succinate (TOPROL XL) 25 MG 24 hr tablet, Take 1 tablet (25 mg total) by mouth at  bedtime., Disp: 30 tablet, Rfl: 11   ondansetron (ZOFRAN-ODT) 4 MG disintegrating tablet, Take 4 mg by mouth every 8 (eight) hours as needed for nausea or vomiting (dissolve orally)., Disp: , Rfl:    pantoprazole (PROTONIX) 40 MG tablet, Take 40 mg by mouth 2 (two) times daily before a meal., Disp: , Rfl:    polyethylene glycol (MIRALAX / GLYCOLAX) 17 g packet, Take 17 g by mouth as needed for mild constipation (mix and drink as directed)., Disp: , Rfl:    potassium chloride (KLOR-CON) 20 MEQ packet, Take 100 mEq by mouth 3 (three) times daily. Take an extra 40 mEq on days when you take metolazone, Disp: 475 packet, Rfl: 3   pravastatin (PRAVACHOL) 20 MG tablet, Take 1 tablet (20 mg total) by mouth daily., Disp: 30 tablet, Rfl: 11   sertraline (ZOLOFT) 50 MG tablet, Take 1 tablet (50 mg total) by mouth daily., Disp: 30 tablet, Rfl: 3  tamsulosin (FLOMAX) 0.4 MG CAPS capsule, Take 1 capsule (0.4 mg total) by mouth daily. Additional refills will need to come from PCP, Disp: 30 capsule, Rfl: 0   torsemide (DEMADEX) 100 MG tablet, TAKE ONE TABLET BY MOUTH DAILY IN THE MORNING AND EVENING, Disp: 90 tablet, Rfl: 3   triamcinolone (KENALOG) 0.025 % ointment, Apply 1 application. topically 2 (two) times daily., Disp: 30 g, Rfl: 0   VENTOLIN HFA 108 (90 Base) MCG/ACT inhaler, Inhale 1 puff into the lungs every 4 (four) hours as needed for shortness of breath. (Patient taking differently: Inhale 2 puffs into the lungs every 4 (four) hours as needed for shortness of breath.), Disp: 6.7 g, Rfl: 1   zolpidem (AMBIEN) 10 MG tablet, Take 1 tablet (10 mg total) by mouth at bedtime as needed for sleep., Disp: 30 tablet, Rfl: 0 Allergies  Allergen Reactions   Penicillins Other (See Comments)    Caused convulsions as a child  Did it involve swelling of the face/tongue/throat, SOB, or low BP? N/A Did it involve sudden or severe rash/hives, skin peeling, or any reaction on the inside of your mouth or nose? N/A Did you  need to seek medical attention at a hospital or doctor's office?N/A When did it last happen? N/A If all above answers are "NO", may proceed with cephalosporin use.     Social History   Socioeconomic History   Marital status: Single    Spouse name: Not on file   Number of children: Not on file   Years of education: Not on file   Highest education level: Not on file  Occupational History   Not on file  Tobacco Use   Smoking status: Never   Smokeless tobacco: Never  Vaping Use   Vaping status: Never Used  Substance and Sexual Activity   Alcohol use: Not Currently   Drug use: Not Currently    Types: Marijuana    Comment: Smoked for 30 years   Sexual activity: Not on file  Other Topics Concern   Not on file  Social History Narrative   Not on file   Social Determinants of Health   Financial Resource Strain: Medium Risk (11/15/2020)   Overall Financial Resource Strain (CARDIA)    Difficulty of Paying Living Expenses: Somewhat hard  Food Insecurity: No Food Insecurity (03/08/2022)   Hunger Vital Sign    Worried About Running Out of Food in the Last Year: Never true    Ran Out of Food in the Last Year: Never true  Transportation Needs: Unmet Transportation Needs (04/21/2022)   PRAPARE - Administrator, Civil Service (Medical): Yes    Lack of Transportation (Non-Medical): Yes  Physical Activity: Not on file  Stress: Not on file  Social Connections: Not on file  Intimate Partner Violence: Not At Risk (03/08/2022)   Humiliation, Afraid, Rape, and Kick questionnaire    Fear of Current or Ex-Partner: No    Emotionally Abused: No    Physically Abused: No    Sexually Abused: No    Physical Exam      Future Appointments  Date Time Provider Department Center  11/13/2022  8:40 AM Laurey Morale, MD [facility] None       ACTION: Home visit completed  Maralyn Sago, EMT-Paramedic [phone] 10/27/2022 [phone] 10/27/22

## 2022-10-28 ENCOUNTER — Telehealth (HOSPITAL_COMMUNITY): Payer: Self-pay | Admitting: Licensed Clinical Social Worker

## 2022-10-28 NOTE — Telephone Encounter (Signed)
H&V Care Navigation CSW Progress Note  Pt discussed in Paramedicine meeting.  Pt concerned about having to give his cat up because his apartment is going to charge $250 for him to keep them.  Reports if he can get a letter from a provider they can waive this charge.  CSW assisted in completing letter and provider reviewed and signed.  Paramedic to bring to pt   SDOH Screenings   Food Insecurity: No Food Insecurity (03/08/2022)  Housing: Low Risk  (03/08/2022)  Transportation Needs: Unmet Transportation Needs (04/21/2022)  Utilities: Not At Risk (03/08/2022)  Alcohol Screen: Low Risk  (06/11/2020)  Depression (PHQ2-9): Medium Risk (06/11/2020)  Financial Resource Strain: Medium Risk (11/15/2020)  Tobacco Use: Low Risk  (07/01/2022)    [name], LCSW Clinical Social Worker [facility] Desk#: [phone] Cell#: [phone]

## 2022-10-30 IMAGING — CR DG FOOT COMPLETE 3+V*R*
3 series · 3 of 3 positions shown · non-contrast
Comparison: None.

CLINICAL DATA: Syncope, fall

EXAM:
RIGHT FOOT COMPLETE - 3+ VIEW

[foot ap]
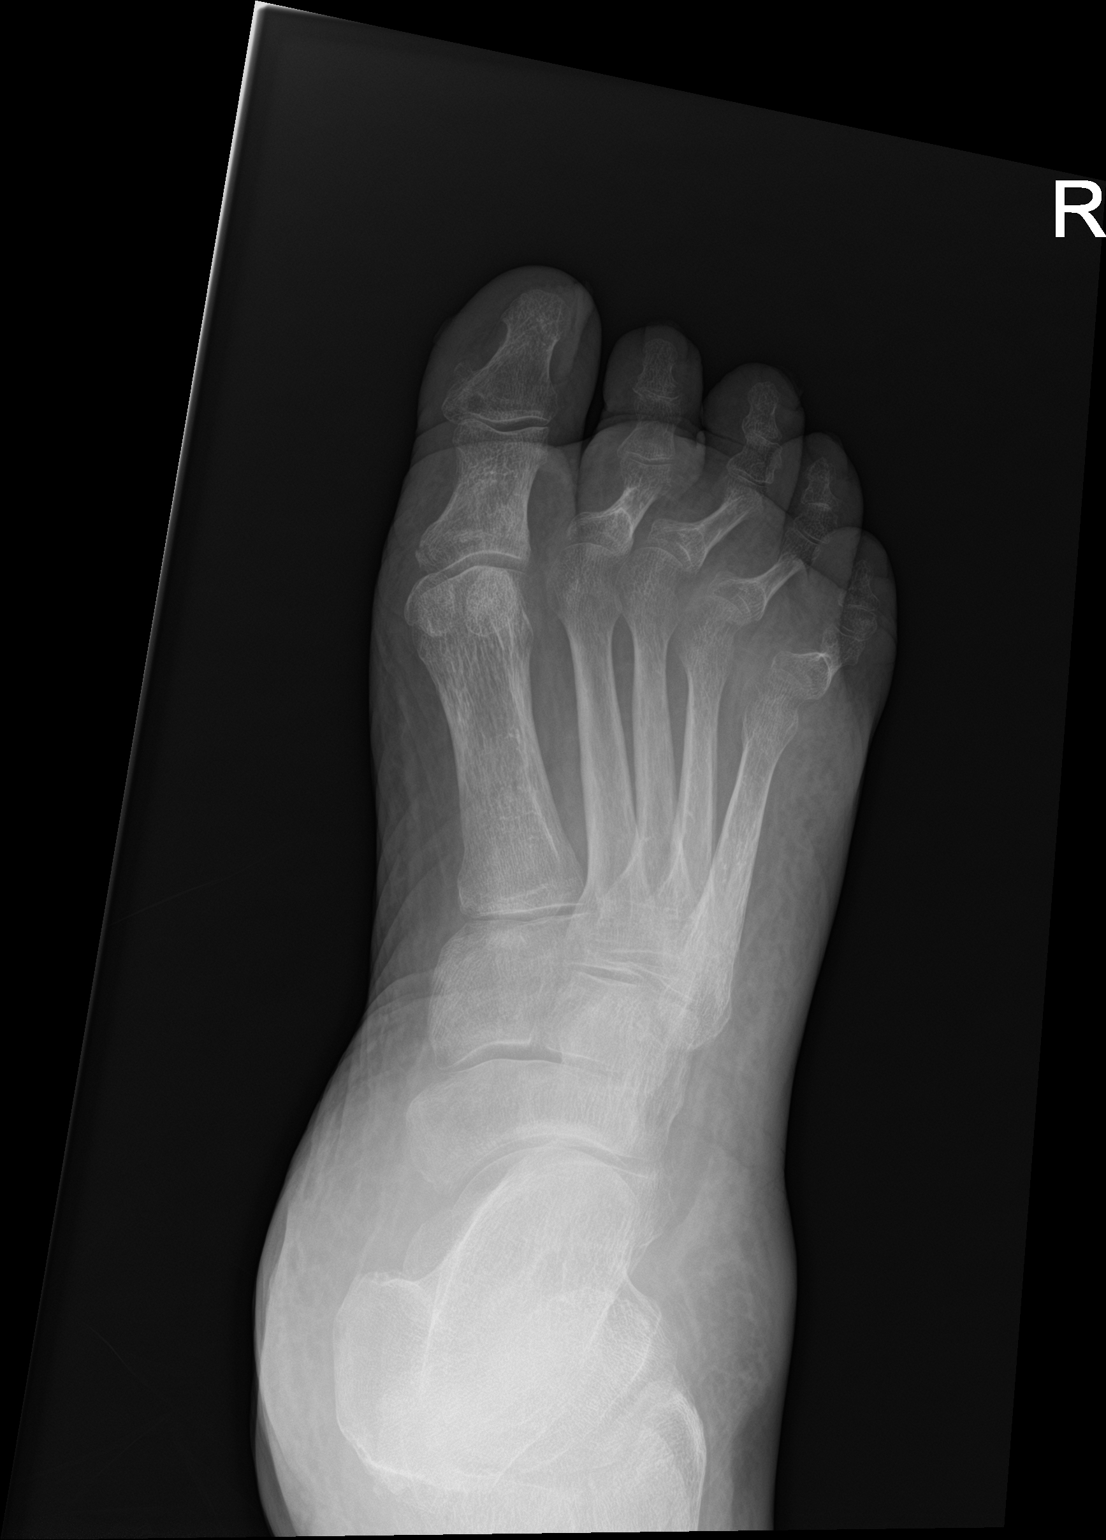

[foot obl]
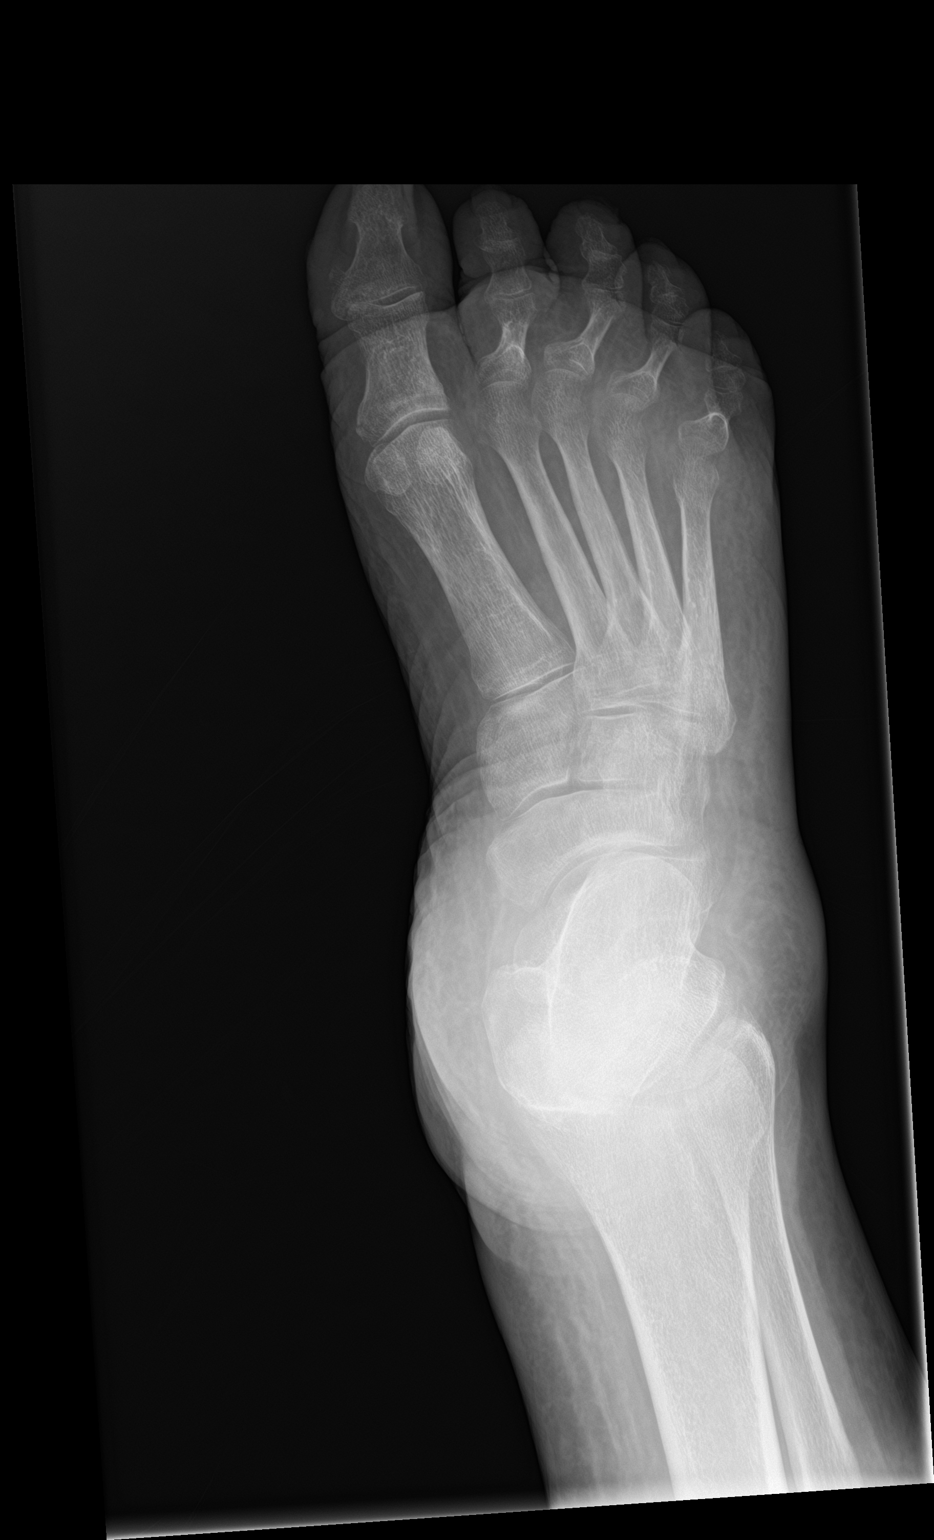

[foot lat]
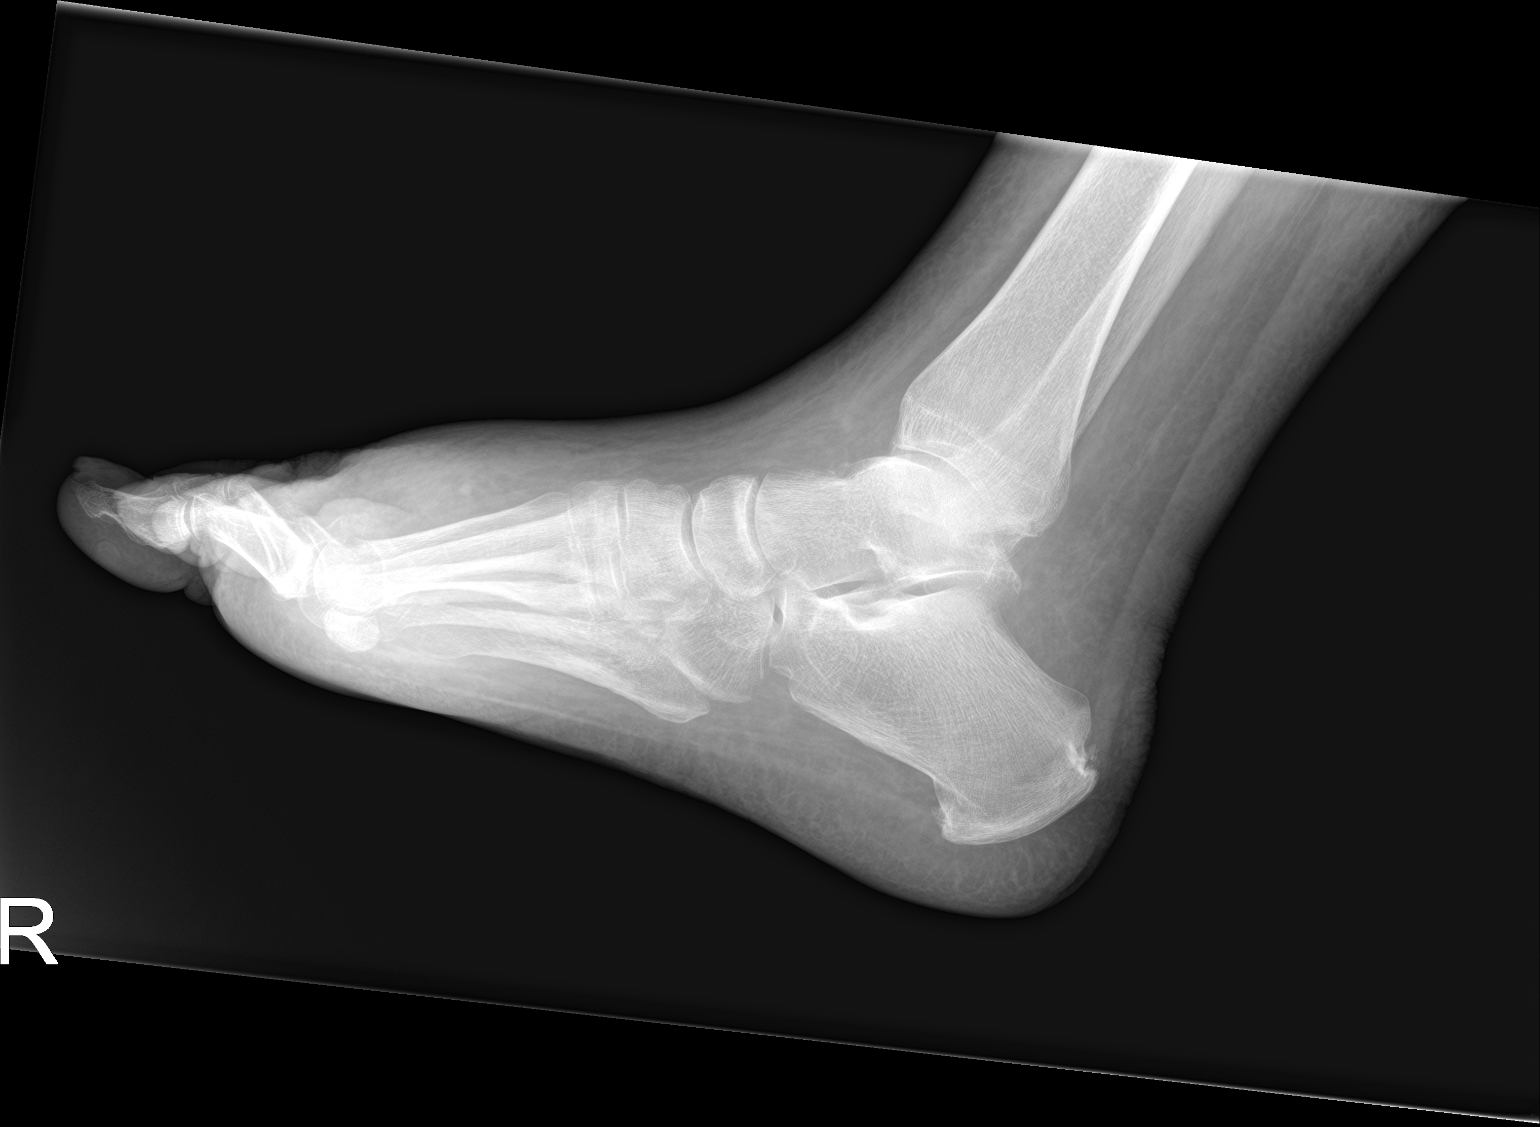

[3 of 3 positions shown; findings below may reference images not displayed]

FINDINGS: Bones appear osteopenic. Diffuse soft tissue swelling. Acute
intra-articular fracture at the base of the first proximal phalanx.
No subluxation
IMPRESSION: Acute nondisplaced intra-articular fracture at the base of the first
proximal phalanx

## 2022-11-10 ENCOUNTER — Other Ambulatory Visit (HOSPITAL_COMMUNITY): Payer: Self-pay | Admitting: Emergency Medicine

## 2022-11-10 NOTE — Progress Notes (Signed)
Paramedicine Encounter    Patient ID: Charles Gray, male    DOB: February 14, 1956, 67 y.o.   MRN: [id number]   Complaints:   - back pain x3 weeks  - left thigh pain   - liver pain   - financial concerns with incontinence supplies  Assessment - CAOx4, warm and dry. Pt was ambulatory upon arrival with pain and difficulty   Compliance with meds - missed 7 noon doses of gabapentin over the last 2x weeks.   Pill box filled - for 2x weeks   Refills needed - Metolozone, metoprolol, pravastatin   Meds changes since last visit - none    Social changes - opportunity to move to ground floor apartment. Reports decreased mobility with back pain and concerned about ability to go to his doctor's appointments due to decreased ability to get to the car.   - Pt also reports newer onset incontinence and reports concern with txp that he will have an incontinence episode in the vehicle on the way or way back from the appointments.   VISIT SUMMARY**  Met with Mr. Revilla in the home. Costas was in high spirits with his aid, [name] present. Pt denies chest pain, dizziness, or shortness of breath. Pt does reported decreased mobility due to a pain in his right lower lumbar region that presents as a palpable knot. Pt was recommended to try OTC options like PT stretches, hot and cold pad alternating, and following up with PCP. Pt was not interested following up with PCP for further evaluation. Pt was stressed of the importance of being present at the clinic for his appointment with Dr. Shirlee Latch. Pt reported that he was concerned that with his decreased mobility, he would not be able to get into the car. Pt was informed of resources and given the phone number of [facility] non-emergency line as a resource to help pt move and get them to the vehicle. Pt was resistant to the idea, but was again stressed the importance of being present at this clinic visit. Pt then complained of ongoing issue with  incontinence and reported that he was nervous that he would have an incontinence episode in the vehicle of his transportation. Pt was educated on options of delaying his fluid pill prior to his appointment to decrease the urge, the paper products available like Depends or chucks. to aid in absorption, and the importance to be prepared with an extra set of clothes just in case any incident could occur. Pt understood but was resistant, stating that the samples he received were too big and refills are expensive. Pt requested Medicaid assistance with incontinence supplies. (Chucks, depends, wipes). Pt was again informed of the ways that he could be successful in getting to the appointment on Friday and was encouraged to try his best. [name] advised that she would set a ride up for him today. Pt was assessed and pill box was filled for 2x weeks. Pt was informed of all upcoming appointments. Will follow up in 2x weeks.     BP 120/62   Pulse 80   Resp 16   Wt (!) 330 lb (149.7 kg)   SpO2 97%   BMI 34.51 kg/m  Weight yesterday-DNW yesterday, 328lbs on Sunday Last visit weight- 331lbs   B[place] Setting EMT-P Community Paramedic  [phone]     ACTION: Home visit completed     Patient Care Team: Nonnie Done., MD as PCP - General (Family Medicine) Laurey Morale, MD as PCP - Cardiology (  Cardiology) [name], LCSW as Social Worker (Licensed Clinical Social Worker)  Patient Active Problem List   Diagnosis Date Noted   Acute on chronic heart failure with preserved ejection fraction (HFpEF) (HCC) 03/04/2022   Dyslipidemia 07/17/2021   Lymphedema 07/08/2021   Melena    Gastric polyps    UGIB (upper gastrointestinal bleed) 04/06/2021   Acute encephalopathy 12/21/2020   COPD (chronic obstructive pulmonary disease) (HCC) 12/21/2020   Chronic diastolic (congestive) heart failure (HCC) 11/14/2020   Syncope 10/28/2020   CHF (congestive heart failure) (HCC) 10/28/2020   Chronic  venous stasis dermatitis of both lower extremities 08/27/2020   Cellulitis of both lower extremities 08/14/2020   Iron deficiency anemia 08/14/2020   GERD without esophagitis 06/10/2020   Atrial fibrillation with rapid ventricular response (HCC) 03/24/2020   Acute on chronic right heart failure (HCC) 01/27/2020   Acute respiratory failure with hypoxia (HCC)    Fatty liver disease, nonalcoholic    Shock (HCC)    Personal history of DVT (deep vein thrombosis) 12/26/2019   Chronic anticoagulation 12/26/2019   Palpitations 12/26/2019   Paroxysmal atrial fibrillation (HCC) 12/23/2019   Heart failure, systolic, acute (HCC) 08/24/2019   Acute on chronic congestive heart failure (HCC)    Acute on chronic diastolic CHF (congestive heart failure) (HCC) 08/23/2019   Chest pain 08/23/2019   Lactic acidosis 08/23/2019   History of pulmonary embolism 08/12/2019   Atrial tachycardia 08/12/2019   Occasional tremors 08/12/2019   OSA (obstructive sleep apnea) 08/12/2019   AKI (acute kidney injury) (HCC) 08/08/2019   Acute on chronic right-sided heart failure (HCC) 08/06/2019   Acute pulmonary embolism (HCC) 06/08/2019   Normocytic anemia 06/08/2019   Pulmonary emboli (HCC) 06/08/2019   Chronic heart failure with preserved ejection fraction (HCC) 05/30/2019   Class 2 severe obesity due to excess calories with serious comorbidity and body mass index (BMI) of 39.0 to 39.9 in adult (HCC) 05/30/2019   Morbid obesity with BMI of 40.0-44.9, adult (HCC) 05/30/2019   Hyponatremia    Drug induced constipation    Peripheral edema    Hypotension due to drugs    Hypoalbuminemia due to protein-calorie malnutrition (HCC)    Thrombocytopenia (HCC)    Anemia of chronic disease    Cirrhosis of liver without ascites (HCC)    Chronic pain syndrome    Debility 03/13/2019   Portal hypertensive gastropathy (HCC) 03/06/2019   Dyspnea    GIB (gastrointestinal bleeding) 03/04/2019   Mixed hyperlipidemia 01/24/2019    Insomnia 01/24/2019   Hyperlipidemia 01/24/2019   Essential hypertension 01/24/2019   Lumbar disc herniation 01/24/2019    Current Outpatient Medications:    apixaban (ELIQUIS) 5 MG TABS tablet, Take 1 tablet (5 mg total) by mouth 2 (two) times daily., Disp: 60 tablet, Rfl: 3   Buprenorphine HCl-Naloxone HCl 8-2 MG FILM, Place 1 Film under the tongue in the morning and at bedtime., Disp: , Rfl:    eplerenone (INSPRA) 50 MG tablet, Take 2 tablets (100 mg total) by mouth daily., Disp: 60 tablet, Rfl: 11   gabapentin (NEURONTIN) 800 MG tablet, Take 1 tablet (800 mg total) by mouth 3 (three) times daily., Disp: 90 tablet, Rfl: 1   lactulose, encephalopathy, (CHRONULAC) 10 GM/15ML SOLN, Take 30 mLs (20 g total) by mouth 2 (two) times daily., Disp: , Rfl:    metolazone (ZAROXOLYN) 2.5 MG tablet, TAKE ONE TABLET BY MOUTH FOUR DAYS a WEEK. (Monday, Wednesday, Friday, Saturday), Disp: 30 tablet, Rfl: 0   metoprolol succinate (  TOPROL XL) 25 MG 24 hr tablet, Take 1 tablet (25 mg total) by mouth at bedtime., Disp: 30 tablet, Rfl: 11   ondansetron (ZOFRAN-ODT) 4 MG disintegrating tablet, Take 4 mg by mouth every 8 (eight) hours as needed for nausea or vomiting (dissolve orally)., Disp: , Rfl:    pantoprazole (PROTONIX) 40 MG tablet, Take 40 mg by mouth 2 (two) times daily before a meal., Disp: , Rfl:    polyethylene glycol (MIRALAX / GLYCOLAX) 17 g packet, Take 17 g by mouth as needed for mild constipation (mix and drink as directed)., Disp: , Rfl:    potassium chloride (KLOR-CON) 20 MEQ packet, Take 100 mEq by mouth 3 (three) times daily. Take an extra 40 mEq on days when you take metolazone, Disp: 475 packet, Rfl: 3   pravastatin (PRAVACHOL) 20 MG tablet, Take 1 tablet (20 mg total) by mouth daily., Disp: 30 tablet, Rfl: 11   sertraline (ZOLOFT) 50 MG tablet, Take 1 tablet (50 mg total) by mouth daily. (Patient taking differently: Take 100 mg by mouth daily. Per Dr. Orvan Falconer), Disp: 30 tablet, Rfl: 3    tamsulosin (FLOMAX) 0.4 MG CAPS capsule, Take 1 capsule (0.4 mg total) by mouth daily. Additional refills will need to come from PCP, Disp: 30 capsule, Rfl: 0   torsemide (DEMADEX) 100 MG tablet, TAKE ONE TABLET BY MOUTH DAILY IN THE MORNING AND EVENING, Disp: 90 tablet, Rfl: 3   triamcinolone (KENALOG) 0.025 % ointment, Apply 1 application. topically 2 (two) times daily., Disp: 30 g, Rfl: 0   VENTOLIN HFA 108 (90 Base) MCG/ACT inhaler, Inhale 1 puff into the lungs every 4 (four) hours as needed for shortness of breath. (Patient taking differently: Inhale 2 puffs into the lungs every 4 (four) hours as needed for shortness of breath.), Disp: 6.7 g, Rfl: 1   zolpidem (AMBIEN) 10 MG tablet, Take 1 tablet (10 mg total) by mouth at bedtime as needed for sleep., Disp: 30 tablet, Rfl: 0 Allergies  Allergen Reactions   Penicillins Other (See Comments)    Caused convulsions as a child  Did it involve swelling of the face/tongue/throat, SOB, or low BP? N/A Did it involve sudden or severe rash/hives, skin peeling, or any reaction on the inside of your mouth or nose? N/A Did you need to seek medical attention at a hospital or doctor's office?N/A When did it last happen? N/A If all above answers are "NO", may proceed with cephalosporin use.     Social History   Socioeconomic History   Marital status: Single    Spouse name: Not on file   Number of children: Not on file   Years of education: Not on file   Highest education level: Not on file  Occupational History   Not on file  Tobacco Use   Smoking status: Never   Smokeless tobacco: Never  Vaping Use   Vaping status: Never Used  Substance and Sexual Activity   Alcohol use: Not Currently   Drug use: Not Currently    Types: Marijuana    Comment: Smoked for 30 years   Sexual activity: Not on file  Other Topics Concern   Not on file  Social History Narrative   Not on file   Social Determinants of Health   Financial Resource Strain: Medium  Risk (11/15/2020)   Overall Financial Resource Strain (CARDIA)    Difficulty of Paying Living Expenses: Somewhat hard  Food Insecurity: No Food Insecurity (03/08/2022)   Hunger Vital Sign  Worried About Programme researcher, broadcasting/film/video in the Last Year: Never true    Ran Out of Food in the Last Year: Never true  Transportation Needs: Unmet Transportation Needs (04/21/2022)   PRAPARE - Administrator, Civil Service (Medical): Yes    Lack of Transportation (Non-Medical): Yes  Physical Activity: Not on file  Stress: Not on file  Social Connections: Not on file  Intimate Partner Violence: Not At Risk (03/08/2022)   Humiliation, Afraid, Rape, and Kick questionnaire    Fear of Current or Ex-Partner: No    Emotionally Abused: No    Physically Abused: No    Sexually Abused: No    Physical Exam      Future Appointments  Date Time Provider Department Center  11/13/2022  8:40 AM Laurey Morale, MD [facility] None

## 2022-11-11 DIAGNOSIS — F1120 Opioid dependence, uncomplicated: Secondary | ICD-10-CM | POA: Diagnosis not present

## 2022-11-11 DIAGNOSIS — F411 Generalized anxiety disorder: Secondary | ICD-10-CM | POA: Diagnosis not present

## 2022-11-11 DIAGNOSIS — F332 Major depressive disorder, recurrent severe without psychotic features: Secondary | ICD-10-CM | POA: Diagnosis not present

## 2022-11-13 ENCOUNTER — Encounter (HOSPITAL_COMMUNITY): Payer: Medicare HMO | Admitting: Cardiology

## 2022-11-20 ENCOUNTER — Telehealth: Payer: Self-pay | Admitting: Cardiology

## 2022-11-20 NOTE — Telephone Encounter (Signed)
Pt called with concern for dehydration.  He is also more SOB. He tells me his Cardiomems were increased yesterday but he felt fine.  Now he feels SOB.  He has stomach pain and feels nauseated.  He took his diuretics today.  I explained I was not sure if he was dry or volume overloaded -  I asked him to go to ER but he is not wanting to do that - he will call EMS if he does not feel better.  He is having some incontinence as well. We discussed possible infection. He stated he would call EMS if no better but he would try to hold off.  Sending message to [facility] team.

## 2022-11-22 NOTE — Telephone Encounter (Signed)
Check in with him to see how he is feeling and get Cardiomems reading.

## 2022-11-23 ENCOUNTER — Telehealth (HOSPITAL_COMMUNITY): Payer: Self-pay | Admitting: Emergency Medicine

## 2022-11-23 NOTE — Telephone Encounter (Signed)
Received a text from Kolt today advising,   "I'm still feeling pretty bad my arms and legs are so weak and I staying sick to my stomach again dont know how I will get down my stairs for my Doctors appointment this week my legs are both giving out on me just walking in the apartment"  Pt was advised that there is a safety concern if he is unable to ambulate and further advised pt to follow up in ED. Pt has been urged to be evaluated for same through his PCP and refused.   Cardio Mems reviewed by myself and Tonye Becket, NP, and shared with pt and aid.   Will follow up in the home tomorrow morning.   B[place] Setting EMT-P Community Paramedic  [phone]

## 2022-11-23 NOTE — Telephone Encounter (Addendum)
  Cardiomems Remote Monitoring  S/P Cardiomems Implant   PAD Goal: 9 Most recent reading: 9   Recommended changes: Not suggestive of fluid accumulation.   I continue to review and analyze the patients PA pressures weekly (and more often as needed) to bring PA pressures within the optimal range.    No change needed for diuretics. Please call HF Paramedics.       NP-C  12:26 PM

## 2022-11-24 ENCOUNTER — Telehealth (HOSPITAL_COMMUNITY): Payer: Self-pay | Admitting: Emergency Medicine

## 2022-11-24 ENCOUNTER — Other Ambulatory Visit (HOSPITAL_COMMUNITY): Payer: Self-pay | Admitting: Emergency Medicine

## 2022-11-24 NOTE — Telephone Encounter (Signed)
Seen by community paramedic 8/13

## 2022-11-24 NOTE — Telephone Encounter (Signed)
Spoke with Michel today and he informed me of a 6lb weight gain overnight.   Pt reports cutting his Metolazone in half, despite me putting a whole pill in his pill box. Pt reports when he takes a whole one he feels too dry. Pt was encouraged to take the other half of the metolazone pill today to comply with prescription.   Pt reports decreased mobility recently and decreased steadiness when weighing. I am unsure if this weight change is due to a poor weight assessment or true weight gain, will follow up for daily weights and Mems.    Today 330lbs Mems 8, goal 9 Yesterday 324lbs, Mems 5, goal 9   Benson Setting [employer]  [phone]

## 2022-11-24 NOTE — Progress Notes (Signed)
Paramedicine Encounter    Patient ID: Charles Gray, male    DOB: [birth year], 67 y.o.   MRN: [id number]   Complaints - "every joint hurts", decreased mobility due to pain, increased incontinence, decreased independence    Assessment - +3 Edema in compression socks (baseline) , Clear lung sounds, alert and oriented. Ambulatory with walker, but slow moving around. Reports unsteadiness on feet, concerned with accuracy of scale with unsteadiness  Compliance with meds - no missed doses (Pt reports only taking half of a metolazone 3x/ week despite me putting in a whole pill)  Pill box filled - for 2x weeks   Refills needed - Eplerenone, gabapentin  Meds changes since last visit - none    Social changes - possibility of moving to downstairs apartment. Pt reports inability to walk down stairs to ride to be present at doctors appointments.    VISIT SUMMARY**  Met with Aysen in the home. Pt reports increased pain in his joints and decreased mobility for same. Pt has been advised to be evaluated by his PCP and in the ED for same, but pt is non-compliant with recommendations. Pt reports increased difficulty holding his urine and reports multiple episodes of incontinence daily. Pt was again advised of the importance of visiting his PCP. Pt reports his concern with ambulating down stairs with his current mobility. Pt was given non-emergency line for [facility] to assist with exit from his home. Pt has been given this reference before and refused to use it. I expressed the safety concern of decreased mobility and the importance of evaluation for same, using the example of: If there were a fire in the building, would you be able to exit to safety? Pt understood and agreed he would think on it. I noted increase in weight of 6lbs over night. Pt advised that he thinks he has been drinking too much. Pt was reeducated on the importance of staying under 64oz of fluid/day. Pt was advised to take the other  half of his metolszone  that he skipped yesterday and I will follow up with the clinic for further instructions on same.   BP 130/88   Pulse 78   Resp 16   SpO2 98% wt: 330lbs Weight yesterday- 324lbs Last visit weight-330lbs     ACTION: Home visit completed  Benson Setting EMT-P Community Paramedic  [phone]     Patient Care Team: Nonnie Done., MD as PCP - General (Family Medicine) Laurey Morale, MD as PCP - Cardiology (Cardiology) [name], LCSW as Social Worker (Licensed Clinical Social Worker)  Patient Active Problem List   Diagnosis Date Noted   Acute on chronic heart failure with preserved ejection fraction (HFpEF) (HCC) 03/04/2022   Dyslipidemia 07/17/2021   Lymphedema 07/08/2021   Melena    Gastric polyps    UGIB (upper gastrointestinal bleed) 04/06/2021   Acute encephalopathy 12/21/2020   COPD (chronic obstructive pulmonary disease) (HCC) 12/21/2020   Chronic diastolic (congestive) heart failure (HCC) 11/14/2020   Syncope 10/28/2020   CHF (congestive heart failure) (HCC) 10/28/2020   Chronic venous stasis dermatitis of both lower extremities 08/27/2020   Cellulitis of both lower extremities 08/14/2020   Iron deficiency anemia 08/14/2020   GERD without esophagitis 06/10/2020   Atrial fibrillation with rapid ventricular response (HCC) 03/24/2020   Acute on chronic right heart failure (HCC) 01/27/2020   Acute respiratory failure with hypoxia (HCC)    Fatty liver disease, nonalcoholic    Shock (HCC)    Personal  history of DVT (deep vein thrombosis) 12/26/2019   Chronic anticoagulation 12/26/2019   Palpitations 12/26/2019   Paroxysmal atrial fibrillation (HCC) 12/23/2019   Heart failure, systolic, acute (HCC) 08/24/2019   Acute on chronic congestive heart failure (HCC)    Acute on chronic diastolic CHF (congestive heart failure) (HCC) 08/23/2019   Chest pain 08/23/2019   Lactic acidosis 08/23/2019   History of pulmonary embolism 08/12/2019    Atrial tachycardia 08/12/2019   Occasional tremors 08/12/2019   OSA (obstructive sleep apnea) 08/12/2019   AKI (acute kidney injury) (HCC) 08/08/2019   Acute on chronic right-sided heart failure (HCC) 08/06/2019   Acute pulmonary embolism (HCC) 06/08/2019   Normocytic anemia 06/08/2019   Pulmonary emboli (HCC) 06/08/2019   Chronic heart failure with preserved ejection fraction (HCC) 05/30/2019   Class 2 severe obesity due to excess calories with serious comorbidity and body mass index (BMI) of 39.0 to 39.9 in adult (HCC) 05/30/2019   Morbid obesity with BMI of 40.0-44.9, adult (HCC) 05/30/2019   Hyponatremia    Drug induced constipation    Peripheral edema    Hypotension due to drugs    Hypoalbuminemia due to protein-calorie malnutrition (HCC)    Thrombocytopenia (HCC)    Anemia of chronic disease    Cirrhosis of liver without ascites (HCC)    Chronic pain syndrome    Debility 03/13/2019   Portal hypertensive gastropathy (HCC) 03/06/2019   Dyspnea    GIB (gastrointestinal bleeding) 03/04/2019   Mixed hyperlipidemia 01/24/2019   Insomnia 01/24/2019   Hyperlipidemia 01/24/2019   Essential hypertension 01/24/2019   Lumbar disc herniation 01/24/2019    Current Outpatient Medications:    apixaban (ELIQUIS) 5 MG TABS tablet, Take 1 tablet (5 mg total) by mouth 2 (two) times daily., Disp: 60 tablet, Rfl: 3   Buprenorphine HCl-Naloxone HCl 8-2 MG FILM, Place 1 Film under the tongue in the morning and at bedtime., Disp: , Rfl:    eplerenone (INSPRA) 50 MG tablet, Take 2 tablets (100 mg total) by mouth daily., Disp: 60 tablet, Rfl: 11   gabapentin (NEURONTIN) 800 MG tablet, Take 1 tablet (800 mg total) by mouth 3 (three) times daily., Disp: 90 tablet, Rfl: 1   lactulose, encephalopathy, (CHRONULAC) 10 GM/15ML SOLN, Take 30 mLs (20 g total) by mouth 2 (two) times daily., Disp: , Rfl:    metolazone (ZAROXOLYN) 2.5 MG tablet, TAKE ONE TABLET BY MOUTH FOUR DAYS a WEEK. (Monday, Wednesday,  Friday, Saturday), Disp: 30 tablet, Rfl: 0   metoprolol succinate (TOPROL XL) 25 MG 24 hr tablet, Take 1 tablet (25 mg total) by mouth at bedtime., Disp: 30 tablet, Rfl: 11   ondansetron (ZOFRAN-ODT) 4 MG disintegrating tablet, Take 4 mg by mouth every 8 (eight) hours as needed for nausea or vomiting (dissolve orally)., Disp: , Rfl:    pantoprazole (PROTONIX) 40 MG tablet, Take 40 mg by mouth 2 (two) times daily before a meal., Disp: , Rfl:    polyethylene glycol (MIRALAX / GLYCOLAX) 17 g packet, Take 17 g by mouth as needed for mild constipation (mix and drink as directed)., Disp: , Rfl:    potassium chloride (KLOR-CON) 20 MEQ packet, Take 100 mEq by mouth 3 (three) times daily. Take an extra 40 mEq on days when you take metolazone, Disp: 475 packet, Rfl: 3   pravastatin (PRAVACHOL) 20 MG tablet, Take 1 tablet (20 mg total) by mouth daily., Disp: 30 tablet, Rfl: 11   sertraline (ZOLOFT) 50 MG tablet, Take 1 tablet (50 mg total)  by mouth daily. (Patient taking differently: Take 100 mg by mouth daily. Per Dr. Orvan Falconer), Disp: 30 tablet, Rfl: 3   tamsulosin (FLOMAX) 0.4 MG CAPS capsule, Take 1 capsule (0.4 mg total) by mouth daily. Additional refills will need to come from PCP, Disp: 30 capsule, Rfl: 0   torsemide (DEMADEX) 100 MG tablet, TAKE ONE TABLET BY MOUTH DAILY IN THE MORNING AND EVENING, Disp: 90 tablet, Rfl: 3   triamcinolone (KENALOG) 0.025 % ointment, Apply 1 application. topically 2 (two) times daily., Disp: 30 g, Rfl: 0   VENTOLIN HFA 108 (90 Base) MCG/ACT inhaler, Inhale 1 puff into the lungs every 4 (four) hours as needed for shortness of breath. (Patient taking differently: Inhale 2 puffs into the lungs every 4 (four) hours as needed for shortness of breath.), Disp: 6.7 g, Rfl: 1   zolpidem (AMBIEN) 10 MG tablet, Take 1 tablet (10 mg total) by mouth at bedtime as needed for sleep., Disp: 30 tablet, Rfl: 0 Allergies  Allergen Reactions   Penicillins Other (See Comments)    Caused  convulsions as a child  Did it involve swelling of the face/tongue/throat, SOB, or low BP? N/A Did it involve sudden or severe rash/hives, skin peeling, or any reaction on the inside of your mouth or nose? N/A Did you need to seek medical attention at a hospital or doctor's office?N/A When did it last happen? N/A If all above answers are "NO", may proceed with cephalosporin use.     Social History   Socioeconomic History   Marital status: Single    Spouse name: Not on file   Number of children: Not on file   Years of education: Not on file   Highest education level: Not on file  Occupational History   Not on file  Tobacco Use   Smoking status: Never   Smokeless tobacco: Never  Vaping Use   Vaping status: Never Used  Substance and Sexual Activity   Alcohol use: Not Currently   Drug use: Not Currently    Types: Marijuana    Comment: Smoked for 30 years   Sexual activity: Not on file  Other Topics Concern   Not on file  Social History Narrative   Not on file   Social Determinants of Health   Financial Resource Strain: Medium Risk (11/15/2020)   Overall Financial Resource Strain (CARDIA)    Difficulty of Paying Living Expenses: Somewhat hard  Food Insecurity: No Food Insecurity (03/08/2022)   Hunger Vital Sign    Worried About Running Out of Food in the Last Year: Never true    Ran Out of Food in the Last Year: Never true  Transportation Needs: Unmet Transportation Needs (04/21/2022)   PRAPARE - Administrator, Civil Service (Medical): Yes    Lack of Transportation (Non-Medical): Yes  Physical Activity: Not on file  Stress: Not on file  Social Connections: Not on file  Intimate Partner Violence: Not At Risk (03/08/2022)   Humiliation, Afraid, Rape, and Kick questionnaire    Fear of Current or Ex-Partner: No    Emotionally Abused: No    Physically Abused: No    Sexually Abused: No    Physical Exam      Future Appointments  Date Time Provider  Department Center  02/01/2023  2:00 PM Laurey Morale, MD [facility] None

## 2022-11-25 ENCOUNTER — Telehealth (HOSPITAL_COMMUNITY): Payer: Self-pay | Admitting: Emergency Medicine

## 2022-11-25 NOTE — Telephone Encounter (Signed)
Attempted to contact pt No answer-unable to leave message  -message to para med as Lorain Childes

## 2022-11-25 NOTE — Telephone Encounter (Signed)
Spoke with Charles Gray today to follow up on his weight gain noted at yesterdays home visit. Pt advised that he felt about the same but reported that his weight was down to 321lbs. I relayed information from Pam Drown, NP to have pt take prescribed dose of 1 full pill of metolazone on metolazone days.   Will continue to monitor memes and weights and H. Karleen Hampshire will follow up via telephone  next week.   [place] Setting EMT-P Community Paramedic  [phone]

## 2022-11-26 ENCOUNTER — Telehealth (HOSPITAL_COMMUNITY): Payer: Self-pay | Admitting: Emergency Medicine

## 2022-11-26 DIAGNOSIS — F332 Major depressive disorder, recurrent severe without psychotic features: Secondary | ICD-10-CM | POA: Diagnosis not present

## 2022-11-26 DIAGNOSIS — F1120 Opioid dependence, uncomplicated: Secondary | ICD-10-CM | POA: Diagnosis not present

## 2022-11-26 DIAGNOSIS — F411 Generalized anxiety disorder: Secondary | ICD-10-CM | POA: Diagnosis not present

## 2022-11-26 NOTE — Telephone Encounter (Signed)
Charles Gray contacted me today advising:   "Feeling bad today got on the scale and still 319[lbs] shaking really bad today just wish I could start feeling better."   I asked if he was able to get a mems reading today and it was 4, goal 9.   I did advise him that if he felt unstable and unable to maneuver safely that she should follow up in the ED.   Will have H. Spencer follow up with phone call next week.   Benson Setting EMT-P Community Paramedic  [phone]

## 2022-11-30 ENCOUNTER — Telehealth (HOSPITAL_COMMUNITY): Payer: Self-pay

## 2022-11-30 NOTE — Telephone Encounter (Signed)
Charles Gray reports he submitted a cardio mems for today and it is 8 with a goal of 9. He denied missing any doses over the weekend of his medications. His weight is 330lbs which is what he was last week,however his legs are red and swollen according to him and his home aide [name]. I advised him to ensure he is keeping his fluid and sodium intake at goal and to be sure to take all medications as prescribed. He agreed. He knows to reach out if he has any concerns and to call EMS if an emergency occurs. He agreed. Call complete.   Maralyn Sago, EMT-Paramedic [phone] 11/30/2022

## 2022-11-30 NOTE — Telephone Encounter (Signed)
The following medications were called into [facility]-  -Inspra -Gabapentin   Maralyn Sago, EMT-Paramedic [phone] 11/30/2022

## 2022-12-08 ENCOUNTER — Other Ambulatory Visit (HOSPITAL_COMMUNITY): Payer: Self-pay | Admitting: Emergency Medicine

## 2022-12-08 NOTE — Progress Notes (Signed)
Paramedicine Encounter    Patient ID: Charles Gray, male    DOB: 11/30/1955, 67 y.o.   MRN: [id number]   Complaints:  - Depression secondary to his mobility, lack of independence, and status of his son's progressing disease process.  continued incontinence, arthritic pain back and neck, and headache. Pt reports feeling like his right leg is going to give out. Pt reported that he had been having sensations of A-Fib and has been monitoring with his pulse ox, noting that his heart rate went up to 120 bpm. Pt also reports pain in his groin, increased urinary frequency, and dark urine color.    Assessment - red swollen legs, no compression socks on same. At baseline for pt. Strong regular rhythm. Lung sounds clear and equal.   Compliance with meds - No missed doses, reviewed and corrected his potassium dose.   Pill box filled - for 2x weeks   Refills needed - none   Meds changes since last visit - none     Social changes- scheduled to moved into downstairs apartment Oct 1st.    VISIT SUMMARY**  Met with Kimoni in the home. Pt reported feeling bad all over, but was unable to pinpoint any acute concerns. Pt reported increased depression in dealing with his son's terminal disease. Pt c/o chronic arthritic pain in his neck and back and reported a headache. Pt reported some episodes of feeling his heart go into A-fib. Pt advised that he has been monitoring it with his pulse ox and reported that it has not gone any higher than 120bpm. Pt reported that he has had increased frequency, decreased output, increased incontinence, and dark urine color with groin pain. Pt was again urged to make a PCP appointment reference his ongoing issues. Pt understood and advised that he and his aid would work on it tomorrow. Pt was assessed with no acute findings noted. Pt denied dizziness, chest pain, and shortness of breath. Pt's pill box was filled for 2x week. No appointment reminders at this time. Will follow up  in the home in 2x weeks.    BP (!) 140/70   Pulse 88   Resp 18   Wt (!) 321 lb (145.6 kg)   SpO2 97%   BMI 33.56 kg/m  Weight yesterday-  Last visit weight- 330lbs CardioMems Goal - 9  Mems - 4 Mems yesterday - 6   Benson Setting EMT-P Community Paramedic  [phone]    ACTION: Home visit completed     Patient Care Team: Nonnie Done., MD as PCP - General (Family Medicine) Laurey Morale, MD as PCP - Cardiology (Cardiology) [name], LCSW as Social Worker (Licensed Clinical Social Worker)  Patient Active Problem List   Diagnosis Date Noted   Acute on chronic heart failure with preserved ejection fraction (HFpEF) (HCC) 03/04/2022   Dyslipidemia 07/17/2021   Lymphedema 07/08/2021   Melena    Gastric polyps    UGIB (upper gastrointestinal bleed) 04/06/2021   Acute encephalopathy 12/21/2020   COPD (chronic obstructive pulmonary disease) (HCC) 12/21/2020   Chronic diastolic (congestive) heart failure (HCC) 11/14/2020   Syncope 10/28/2020   CHF (congestive heart failure) (HCC) 10/28/2020   Chronic venous stasis dermatitis of both lower extremities 08/27/2020   Cellulitis of both lower extremities 08/14/2020   Iron deficiency anemia 08/14/2020   GERD without esophagitis 06/10/2020   Atrial fibrillation with rapid ventricular response (HCC) 03/24/2020   Acute on chronic right heart failure (HCC) 01/27/2020   Acute respiratory failure with  hypoxia (HCC)    Fatty liver disease, nonalcoholic    Shock (HCC)    Personal history of DVT (deep vein thrombosis) 12/26/2019   Chronic anticoagulation 12/26/2019   Palpitations 12/26/2019   Paroxysmal atrial fibrillation (HCC) 12/23/2019   Heart failure, systolic, acute (HCC) 08/24/2019   Acute on chronic congestive heart failure (HCC)    Acute on chronic diastolic CHF (congestive heart failure) (HCC) 08/23/2019   Chest pain 08/23/2019   Lactic acidosis 08/23/2019   History of pulmonary embolism 08/12/2019    Atrial tachycardia 08/12/2019   Occasional tremors 08/12/2019   OSA (obstructive sleep apnea) 08/12/2019   AKI (acute kidney injury) (HCC) 08/08/2019   Acute on chronic right-sided heart failure (HCC) 08/06/2019   Acute pulmonary embolism (HCC) 06/08/2019   Normocytic anemia 06/08/2019   Pulmonary emboli (HCC) 06/08/2019   Chronic heart failure with preserved ejection fraction (HCC) 05/30/2019   Class 2 severe obesity due to excess calories with serious comorbidity and body mass index (BMI) of 39.0 to 39.9 in adult (HCC) 05/30/2019   Morbid obesity with BMI of 40.0-44.9, adult (HCC) 05/30/2019   Hyponatremia    Drug induced constipation    Peripheral edema    Hypotension due to drugs    Hypoalbuminemia due to protein-calorie malnutrition (HCC)    Thrombocytopenia (HCC)    Anemia of chronic disease    Cirrhosis of liver without ascites (HCC)    Chronic pain syndrome    Debility 03/13/2019   Portal hypertensive gastropathy (HCC) 03/06/2019   Dyspnea    GIB (gastrointestinal bleeding) 03/04/2019   Mixed hyperlipidemia 01/24/2019   Insomnia 01/24/2019   Hyperlipidemia 01/24/2019   Essential hypertension 01/24/2019   Lumbar disc herniation 01/24/2019    Current Outpatient Medications:    apixaban (ELIQUIS) 5 MG TABS tablet, Take 1 tablet (5 mg total) by mouth 2 (two) times daily., Disp: 60 tablet, Rfl: 3   Buprenorphine HCl-Naloxone HCl 8-2 MG FILM, Place 1 Film under the tongue in the morning and at bedtime., Disp: , Rfl:    eplerenone (INSPRA) 50 MG tablet, Take 2 tablets (100 mg total) by mouth daily., Disp: 60 tablet, Rfl: 11   gabapentin (NEURONTIN) 800 MG tablet, Take 1 tablet (800 mg total) by mouth 3 (three) times daily., Disp: 90 tablet, Rfl: 1   lactulose, encephalopathy, (CHRONULAC) 10 GM/15ML SOLN, Take 30 mLs (20 g total) by mouth 2 (two) times daily., Disp: , Rfl:    metolazone (ZAROXOLYN) 2.5 MG tablet, TAKE ONE TABLET BY MOUTH FOUR DAYS a WEEK. (Monday, Wednesday,  Friday, Saturday), Disp: 30 tablet, Rfl: 0   metoprolol succinate (TOPROL XL) 25 MG 24 hr tablet, Take 1 tablet (25 mg total) by mouth at bedtime., Disp: 30 tablet, Rfl: 11   ondansetron (ZOFRAN-ODT) 4 MG disintegrating tablet, Take 4 mg by mouth every 8 (eight) hours as needed for nausea or vomiting (dissolve orally)., Disp: , Rfl:    pantoprazole (PROTONIX) 40 MG tablet, Take 40 mg by mouth 2 (two) times daily before a meal., Disp: , Rfl:    polyethylene glycol (MIRALAX / GLYCOLAX) 17 g packet, Take 17 g by mouth as needed for mild constipation (mix and drink as directed)., Disp: , Rfl:    potassium chloride (KLOR-CON) 20 MEQ packet, Take 100 mEq by mouth 3 (three) times daily. Take an extra 40 mEq on days when you take metolazone, Disp: 475 packet, Rfl: 3   pravastatin (PRAVACHOL) 20 MG tablet, Take 1 tablet (20 mg total) by mouth daily.,  Disp: 30 tablet, Rfl: 11   sertraline (ZOLOFT) 50 MG tablet, Take 1 tablet (50 mg total) by mouth daily. (Patient taking differently: Take 100 mg by mouth daily. Per Dr. Orvan Falconer), Disp: 30 tablet, Rfl: 3   tamsulosin (FLOMAX) 0.4 MG CAPS capsule, Take 1 capsule (0.4 mg total) by mouth daily. Additional refills will need to come from PCP, Disp: 30 capsule, Rfl: 0   torsemide (DEMADEX) 100 MG tablet, TAKE ONE TABLET BY MOUTH DAILY IN THE MORNING AND EVENING, Disp: 90 tablet, Rfl: 3   triamcinolone (KENALOG) 0.025 % ointment, Apply 1 application. topically 2 (two) times daily., Disp: 30 g, Rfl: 0   VENTOLIN HFA 108 (90 Base) MCG/ACT inhaler, Inhale 1 puff into the lungs every 4 (four) hours as needed for shortness of breath. (Patient taking differently: Inhale 2 puffs into the lungs every 4 (four) hours as needed for shortness of breath.), Disp: 6.7 g, Rfl: 1   zolpidem (AMBIEN) 10 MG tablet, Take 1 tablet (10 mg total) by mouth at bedtime as needed for sleep., Disp: 30 tablet, Rfl: 0 Allergies  Allergen Reactions   Penicillins Other (See Comments)    Caused  convulsions as a child  Did it involve swelling of the face/tongue/throat, SOB, or low BP? N/A Did it involve sudden or severe rash/hives, skin peeling, or any reaction on the inside of your mouth or nose? N/A Did you need to seek medical attention at a hospital or doctor's office?N/A When did it last happen? N/A If all above answers are "NO", may proceed with cephalosporin use.     Social History   Socioeconomic History   Marital status: Single    Spouse name: Not on file   Number of children: Not on file   Years of education: Not on file   Highest education level: Not on file  Occupational History   Not on file  Tobacco Use   Smoking status: Never   Smokeless tobacco: Never  Vaping Use   Vaping status: Never Used  Substance and Sexual Activity   Alcohol use: Not Currently   Drug use: Not Currently    Types: Marijuana    Comment: Smoked for 30 years   Sexual activity: Not on file  Other Topics Concern   Not on file  Social History Narrative   Not on file   Social Determinants of Health   Financial Resource Strain: Medium Risk (11/15/2020)   Overall Financial Resource Strain (CARDIA)    Difficulty of Paying Living Expenses: Somewhat hard  Food Insecurity: No Food Insecurity (03/08/2022)   Hunger Vital Sign    Worried About Running Out of Food in the Last Year: Never true    Ran Out of Food in the Last Year: Never true  Transportation Needs: Unmet Transportation Needs (04/21/2022)   PRAPARE - Administrator, Civil Service (Medical): Yes    Lack of Transportation (Non-Medical): Yes  Physical Activity: Not on file  Stress: Not on file  Social Connections: Not on file  Intimate Partner Violence: Not At Risk (03/08/2022)   Humiliation, Afraid, Rape, and Kick questionnaire    Fear of Current or Ex-Partner: No    Emotionally Abused: No    Physically Abused: No    Sexually Abused: No    Physical Exam Constitutional:      Appearance: Normal appearance.   HENT:     Nose: Nose normal.     Mouth/Throat:     Mouth: Mucous membranes are moist.  Eyes:  Pupils: Pupils are equal, round, and reactive to light.  Cardiovascular:     Rate and Rhythm: Normal rate and regular rhythm.  Pulmonary:     Effort: No respiratory distress.     Breath sounds: Normal breath sounds. No wheezing or rales.  Abdominal:     Palpations: Abdomen is soft.  Genitourinary:    Comments: Pain In groin  Musculoskeletal:        General: Swelling present.     Cervical back: Pain with movement present.     Right lower leg: Edema present.     Left lower leg: Edema present.  Skin:    General: Skin is warm and dry.  Neurological:     General: No focal deficit present.     Mental Status: He is alert. Mental status is at baseline.  Psychiatric:     Comments: Increased depression          Future Appointments  Date Time Provider Department Center  02/01/2023  2:00 PM Laurey Morale, MD [facility] None

## 2022-12-17 ENCOUNTER — Telehealth (HOSPITAL_COMMUNITY): Payer: Self-pay | Admitting: Emergency Medicine

## 2022-12-17 NOTE — Telephone Encounter (Signed)
Received a call from [facility] advising that he was not feeling well.  Pt reports that he feel like he has a new onset of a mass at he entrance of his rectum. Pt reports that it hurts, but denies any blood noted in his stools.   Pt reports that he feels weak all over and has pain all over and has noted his heart rate above 100 bpm over the past 2x days.   Pt advised that he also had an increase of pain to his left lower flank.   Pt reported increased swelling to his legs with fluid blisters popping on same.  Pt advised that he felt like he could "just pass out".   Pt was advised to check his pulse and his blood pressure while on the phone.  BP: 128/80,  Pulse: 101 bpm,  Weight: 327 lbs (yesterday), Mems: 7 - Goal: 9.   5 lb weight gain noted from last week's home visit. Pt advised that he was too weak to weigh today.   Pt was instructed to call 911 to be further evaluated at [facility]   Pt did not want  to go to the hospital. Pt was educated on my medical recommendation for transport and evaluation. Pt advised that he would think about it and make a decision tonight. Will have H. Spencer follow up in the AM via phone on same.   Benson Setting EMT-P Community Paramedic  [phone]

## 2022-12-18 ENCOUNTER — Telehealth (HOSPITAL_COMMUNITY): Payer: Self-pay

## 2022-12-18 DIAGNOSIS — Z6834 Body mass index (BMI) 34.0-34.9, adult: Secondary | ICD-10-CM | POA: Diagnosis not present

## 2022-12-18 DIAGNOSIS — F1120 Opioid dependence, uncomplicated: Secondary | ICD-10-CM | POA: Diagnosis not present

## 2022-12-18 DIAGNOSIS — F332 Major depressive disorder, recurrent severe without psychotic features: Secondary | ICD-10-CM | POA: Diagnosis not present

## 2022-12-18 DIAGNOSIS — F411 Generalized anxiety disorder: Secondary | ICD-10-CM | POA: Diagnosis not present

## 2022-12-18 NOTE — Telephone Encounter (Signed)
Para medicine note to HF team For further input if needed

## 2022-12-18 NOTE — Telephone Encounter (Signed)
Spoke to Tioga today and he reports feeling a little better today but said he is still struggling with constipation. He says he has not tried any OTC laxatives or stool softeners. I advised him to use the ones he has. He agreed with this. He said he will schedule a primary care appointment for all his concerns.   He was seen in person today by his Suboxone doctor with Dr. Orvan Falconer. Kayhan reports no med changes today. He did say that he used the non-emergency line and used EMS for their stair chair to get downstairs and upstairs for his appointment and his return home.   He is interested in having [employer] help pay for his incontinent I will have Jenna reach out to give him that information.   He denied any further needs at this time- I advised him if any symptoms changed or worsened to call EMS- he agreed with plan.   Maralyn Sago will follow up accordingly.   Maralyn Sago, EMT-Paramedic [phone] 12/18/2022

## 2022-12-21 ENCOUNTER — Telehealth (HOSPITAL_COMMUNITY): Payer: Self-pay | Admitting: Emergency Medicine

## 2022-12-21 NOTE — Telephone Encounter (Signed)
Yesterday 324 lbs Today 328 lbs  Mems:11, goal 9  HR: 90 SP02: 97%   Charles Gray called me to inform me that he is up 4 lbs from yesterday. Pt reports compliance of all of his medication and has had increased weight gain despite it, continues to complain of increased pulse (120s) and generalized weakness. Pt has failed to make a PCP appointment and was reminded of same again this evening.   Today was a metolazone day. I have a scheduled home visit in the AM, will reevaluate situation then, and reach out for further guidance then if necessary.   Benson Setting EMT-P Community Paramedic  [phone]

## 2022-12-22 ENCOUNTER — Other Ambulatory Visit (HOSPITAL_COMMUNITY): Payer: Self-pay | Admitting: Emergency Medicine

## 2022-12-22 ENCOUNTER — Telehealth (HOSPITAL_COMMUNITY): Payer: Self-pay | Admitting: Emergency Medicine

## 2022-12-22 NOTE — Telephone Encounter (Signed)
Spoke with PCP office about starting up [employer] again for Charles Gray due to his increased swelling with open wounds. They advised that they could add the order and have his home health aid apply them.   Benson Setting EMT-P Community Paramedic  [phone]

## 2022-12-22 NOTE — Progress Notes (Signed)
Paramedicine Encounter    Patient ID: Charles Gray, male    DOB: March 11, 1956, 67 y.o.   MRN: [id number]   Complaints - muscle weakness, dizziness when lifting his head (chronic for last few months).   Assessment - lung sounds clear, increased swelling and redness with new sores noted.   Compliance with meds - no missed doses noted.   Pill box filled -- for 2 weeks   Refills needed - Elerenone, Gabapentin, torsemide   Meds changes since last visit - none    Social changes -  Still on track to move to a first floor apartment on October 1. Pt's mobility had decreased significantly and pt unable to get down or back up stairs without assistance. Had to call non-emergency EMS last week for pain clinic appointment to assist pt up down stair and back up. Required 4x responders to assist pt to his apartment.    VISIT SUMMARY**  Met with Aayan in the home today. Pt has called 2x in the last 2x weeks for feeling bad as noted in notes. Pt reported 4 lb weight gain yesterday on the phone. Today pt advised that he was feeling better than he was yesterday. Pt advised his legs were still swollen and had new sores on same. +1 edema noted at the knee. Swelling felt more loose fluid than pitting edema. Pt's aid advised that M-F she has been keeping his compression stockings on and cream for the sores. Will follow up with PCP on the possibility for [employer]. Pt reports low sodium diet, but came in today.and noted him eating a fast food sausage biscuit. Pt was educated on diet and fluid intake as noted to have multiple bottles in his trash can of a variety of drinks. Pt reports compliance with mirlax and lactulose and reports increase bowel movements. Pt's pill box was filled for 2x weeks. Reviewed upcoming appointments and encouraged scheduling PCP appointment after the first of month to help with feasibility to get tot he appointment, also advised to inquire on virtual or phone appointments in the meantime.  Will follow up in the home in 2x weeks.   BP 122/78   Pulse 86 Comment: regular  Resp 18   Wt (!) 327 lb (148.3 kg)   SpO2 97%   BMI 34.19 kg/m  Weight yesterday-328 lbs  Last visit weight-321 lbs   [place] Setting EMT-P Community Paramedic  [phone]     ACTION: Home visit completed     Patient Care Team: Nonnie Done., MD as PCP - General (Family Medicine) Laurey Morale, MD as PCP - Cardiology (Cardiology) [name], LCSW as Social Worker (Licensed Clinical Social Worker)  Patient Active Problem List   Diagnosis Date Noted   Acute on chronic heart failure with preserved ejection fraction (HFpEF) (HCC) 03/04/2022   Dyslipidemia 07/17/2021   Lymphedema 07/08/2021   Melena    Gastric polyps    UGIB (upper gastrointestinal bleed) 04/06/2021   Acute encephalopathy 12/21/2020   COPD (chronic obstructive pulmonary disease) (HCC) 12/21/2020   Chronic diastolic (congestive) heart failure (HCC) 11/14/2020   Syncope 10/28/2020   CHF (congestive heart failure) (HCC) 10/28/2020   Chronic venous stasis dermatitis of both lower extremities 08/27/2020   Cellulitis of both lower extremities 08/14/2020   Iron deficiency anemia 08/14/2020   GERD without esophagitis 06/10/2020   Atrial fibrillation with rapid ventricular response (HCC) 03/24/2020   Acute on chronic right heart failure (HCC) 01/27/2020   Acute respiratory failure with hypoxia (HCC)  Fatty liver disease, nonalcoholic    Shock (HCC)    Personal history of DVT (deep vein thrombosis) 12/26/2019   Chronic anticoagulation 12/26/2019   Palpitations 12/26/2019   Paroxysmal atrial fibrillation (HCC) 12/23/2019   Heart failure, systolic, acute (HCC) 08/24/2019   Acute on chronic congestive heart failure (HCC)    Acute on chronic diastolic CHF (congestive heart failure) (HCC) 08/23/2019   Chest pain 08/23/2019   Lactic acidosis 08/23/2019   History of pulmonary embolism 08/12/2019   Atrial tachycardia  08/12/2019   Occasional tremors 08/12/2019   OSA (obstructive sleep apnea) 08/12/2019   AKI (acute kidney injury) (HCC) 08/08/2019   Acute on chronic right-sided heart failure (HCC) 08/06/2019   Acute pulmonary embolism (HCC) 06/08/2019   Normocytic anemia 06/08/2019   Pulmonary emboli (HCC) 06/08/2019   Chronic heart failure with preserved ejection fraction (HCC) 05/30/2019   Class 2 severe obesity due to excess calories with serious comorbidity and body mass index (BMI) of 39.0 to 39.9 in adult (HCC) 05/30/2019   Morbid obesity with BMI of 40.0-44.9, adult (HCC) 05/30/2019   Hyponatremia    Drug induced constipation    Peripheral edema    Hypotension due to drugs    Hypoalbuminemia due to protein-calorie malnutrition (HCC)    Thrombocytopenia (HCC)    Anemia of chronic disease    Cirrhosis of liver without ascites (HCC)    Chronic pain syndrome    Debility 03/13/2019   Portal hypertensive gastropathy (HCC) 03/06/2019   Dyspnea    GIB (gastrointestinal bleeding) 03/04/2019   Mixed hyperlipidemia 01/24/2019   Insomnia 01/24/2019   Hyperlipidemia 01/24/2019   Essential hypertension 01/24/2019   Lumbar disc herniation 01/24/2019    Current Outpatient Medications:    apixaban (ELIQUIS) 5 MG TABS tablet, Take 1 tablet (5 mg total) by mouth 2 (two) times daily., Disp: 60 tablet, Rfl: 3   Buprenorphine HCl-Naloxone HCl 8-2 MG FILM, Place 1 Film under the tongue in the morning and at bedtime., Disp: , Rfl:    eplerenone (INSPRA) 50 MG tablet, Take 2 tablets (100 mg total) by mouth daily., Disp: 60 tablet, Rfl: 11   gabapentin (NEURONTIN) 800 MG tablet, Take 1 tablet (800 mg total) by mouth 3 (three) times daily., Disp: 90 tablet, Rfl: 1   lactulose, encephalopathy, (CHRONULAC) 10 GM/15ML SOLN, Take 30 mLs (20 g total) by mouth 2 (two) times daily., Disp: , Rfl:    metolazone (ZAROXOLYN) 2.5 MG tablet, TAKE ONE TABLET BY MOUTH FOUR DAYS a WEEK. (Monday, Wednesday, Friday, Saturday),  Disp: 30 tablet, Rfl: 0   metoprolol succinate (TOPROL XL) 25 MG 24 hr tablet, Take 1 tablet (25 mg total) by mouth at bedtime., Disp: 30 tablet, Rfl: 11   ondansetron (ZOFRAN-ODT) 4 MG disintegrating tablet, Take 4 mg by mouth every 8 (eight) hours as needed for nausea or vomiting (dissolve orally)., Disp: , Rfl:    pantoprazole (PROTONIX) 40 MG tablet, Take 40 mg by mouth 2 (two) times daily before a meal., Disp: , Rfl:    polyethylene glycol (MIRALAX / GLYCOLAX) 17 g packet, Take 17 g by mouth as needed for mild constipation (mix and drink as directed)., Disp: , Rfl:    potassium chloride (KLOR-CON) 20 MEQ packet, Take 100 mEq by mouth 3 (three) times daily. Take an extra 40 mEq on days when you take metolazone, Disp: 475 packet, Rfl: 3   pravastatin (PRAVACHOL) 20 MG tablet, Take 1 tablet (20 mg total) by mouth daily., Disp: 30 tablet, Rfl: 11  sertraline (ZOLOFT) 50 MG tablet, Take 1 tablet (50 mg total) by mouth daily. (Patient taking differently: Take 100 mg by mouth daily. Per Dr. Orvan Falconer), Disp: 30 tablet, Rfl: 3   tamsulosin (FLOMAX) 0.4 MG CAPS capsule, Take 1 capsule (0.4 mg total) by mouth daily. Additional refills will need to come from PCP, Disp: 30 capsule, Rfl: 0   torsemide (DEMADEX) 100 MG tablet, TAKE ONE TABLET BY MOUTH DAILY IN THE MORNING AND EVENING, Disp: 90 tablet, Rfl: 3   triamcinolone (KENALOG) 0.025 % ointment, Apply 1 application. topically 2 (two) times daily., Disp: 30 g, Rfl: 0   VENTOLIN HFA 108 (90 Base) MCG/ACT inhaler, Inhale 1 puff into the lungs every 4 (four) hours as needed for shortness of breath. (Patient taking differently: Inhale 2 puffs into the lungs every 4 (four) hours as needed for shortness of breath.), Disp: 6.7 g, Rfl: 1   zolpidem (AMBIEN) 10 MG tablet, Take 1 tablet (10 mg total) by mouth at bedtime as needed for sleep., Disp: 30 tablet, Rfl: 0 Allergies  Allergen Reactions   Penicillins Other (See Comments)    Caused convulsions as a child   Did it involve swelling of the face/tongue/throat, SOB, or low BP? N/A Did it involve sudden or severe rash/hives, skin peeling, or any reaction on the inside of your mouth or nose? N/A Did you need to seek medical attention at a hospital or doctor's office?N/A When did it last happen? N/A If all above answers are "NO", may proceed with cephalosporin use.     Social History   Socioeconomic History   Marital status: Single    Spouse name: Not on file   Number of children: Not on file   Years of education: Not on file   Highest education level: Not on file  Occupational History   Not on file  Tobacco Use   Smoking status: Never   Smokeless tobacco: Never  Vaping Use   Vaping status: Never Used  Substance and Sexual Activity   Alcohol use: Not Currently   Drug use: Not Currently    Types: Marijuana    Comment: Smoked for 30 years   Sexual activity: Not on file  Other Topics Concern   Not on file  Social History Narrative   Not on file   Social Determinants of Health   Financial Resource Strain: Medium Risk (11/15/2020)   Overall Financial Resource Strain (CARDIA)    Difficulty of Paying Living Expenses: Somewhat hard  Food Insecurity: No Food Insecurity (03/08/2022)   Hunger Vital Sign    Worried About Running Out of Food in the Last Year: Never true    Ran Out of Food in the Last Year: Never true  Transportation Needs: Unmet Transportation Needs (04/21/2022)   PRAPARE - Administrator, Civil Service (Medical): Yes    Lack of Transportation (Non-Medical): Yes  Physical Activity: Not on file  Stress: Not on file  Social Connections: Not on file  Intimate Partner Violence: Not At Risk (03/08/2022)   Humiliation, Afraid, Rape, and Kick questionnaire    Fear of Current or Ex-Partner: No    Emotionally Abused: No    Physically Abused: No    Sexually Abused: No    Physical Exam      Future Appointments  Date Time Provider Department Center  02/01/2023   2:00 PM Laurey Morale, MD [facility] None

## 2022-12-24 ENCOUNTER — Telehealth (HOSPITAL_COMMUNITY): Payer: Self-pay | Admitting: Emergency Medicine

## 2022-12-24 NOTE — Telephone Encounter (Signed)
Received a phone call from [facility]

## 2022-12-24 NOTE — Telephone Encounter (Signed)
Received a phone call from [facility] advising that his aid had examined him to find that the cause of his pain was 2 hemrroids, advising one was the side of a thumb. Pt was concerned of the risk of bleeding out and was advised that is a risk and immediately referred to primary care to further evaluate and manage.   Pt reported continued pain in his left arm and decreased mobility due to the pain. Pt advised that the pain was secondary to the falls he took a few weeks ago when he was weak. Pt was again referred to contact primary care regarding pain.   Pt questioned what was the cause of his recent episodes of tachycardia (120s) Pt reports asymptomatic with same, but just worries about it. Pt is unable to target specifically when it happens other than when he wakes up from dosing off. Pt was advised that we would follow up with Dr. Shirlee Latch on same at our next appointment.   Will follow up with him next week.   Benson Setting EMT-P Community Paramedic  [phone]

## 2022-12-28 ENCOUNTER — Telehealth (HOSPITAL_COMMUNITY): Payer: Self-pay | Admitting: Emergency Medicine

## 2022-12-28 ENCOUNTER — Telehealth (HOSPITAL_COMMUNITY): Payer: Self-pay | Admitting: Licensed Clinical Social Worker

## 2022-12-28 NOTE — Telephone Encounter (Signed)
H&V Care Navigation CSW Progress Note  Clinical Social Worker consulted to help patient order incontinence supplies through his insurance.  CSW able to assist patient in calling [employer]- was able to order samples of two different options as patient has struggled to find ones that fit.  He will call back [employer] once he tries these samples and determines which ones he wants.   SDOH Screenings   Food Insecurity: No Food Insecurity (03/08/2022)  Housing: Low Risk  (03/08/2022)  Transportation Needs: Unmet Transportation Needs (04/21/2022)  Utilities: Not At Risk (03/08/2022)  Alcohol Screen: Low Risk  (06/11/2020)  Depression (PHQ2-9): Medium Risk (06/11/2020)  Financial Resource Strain: Medium Risk (11/15/2020)  Tobacco Use: Low Risk  (07/01/2022)   [name], LCSW Clinical Social Worker [facility] Desk#: [phone] Cell#: [phone]

## 2022-12-28 NOTE — Telephone Encounter (Signed)
Received a text that reported that Charles Gray's legs are getting worse. He sent a picture with a red, swollen leg with opened wounds. Advised I would follow up with primary care regarding Unna Boots that I requested last week.   Spoke with primary care who had already spoke with him and advised that he would have to be seen in the office to have unna boots applied as his aid is not trained in same.   Followed up with patient and aid and they expressed their inability to get there due to pt's immobility. I reiterated the resources available and urged the importance of being seen and treated in the office. Both seemed to understand.   Will follow up to see if they made an appointment.   Benson Setting EMT-P Community Paramedic  [phone]

## 2023-01-01 DIAGNOSIS — F332 Major depressive disorder, recurrent severe without psychotic features: Secondary | ICD-10-CM | POA: Diagnosis not present

## 2023-01-01 DIAGNOSIS — F1120 Opioid dependence, uncomplicated: Secondary | ICD-10-CM | POA: Diagnosis not present

## 2023-01-01 DIAGNOSIS — F411 Generalized anxiety disorder: Secondary | ICD-10-CM | POA: Diagnosis not present

## 2023-01-05 ENCOUNTER — Telehealth (HOSPITAL_COMMUNITY): Payer: Self-pay | Admitting: Cardiology

## 2023-01-05 ENCOUNTER — Telehealth (HOSPITAL_COMMUNITY): Payer: Self-pay | Admitting: Emergency Medicine

## 2023-01-05 ENCOUNTER — Other Ambulatory Visit (HOSPITAL_COMMUNITY): Payer: Self-pay | Admitting: Emergency Medicine

## 2023-01-05 NOTE — Telephone Encounter (Signed)
Sarah with para medicine called during patients home visit to report 17 lb weight loss in ~2 weeks  Weight 310  weight 9/10 327 154/88 HR 78 Pt reports increase in dizziness Increase in UOP BLE is still present-?cellulitis PCP appt pending  Cardio MEMs-7 (Goal 11)   Sarah wanted to confirm if medications are needed while in the home

## 2023-01-05 NOTE — Telephone Encounter (Signed)
Spoke with Mr. Bouman on the phone and reiterated Jessica's orders to hold Metolazone tomorrow (Wednesday, September 25th). Ch[place]buka was very aware which pill to hold and advised that he would hold it tomorrow.   Will follow up by the end of the week.   Benson Setting EMT-P Community Paramedic  [phone]

## 2023-01-05 NOTE — Telephone Encounter (Signed)
Spoke with [employer] and ordered pt's briefs - full 200/month, Gloves size M, and Chucks. [employer] advised that pt's insurance would not cover wipes and he would have to pay for same out of pocket. Order was sent to PCP and [employer] advised, depending on responsiveness of PCP, supplies can be in the home by the end of the week.  Pt informed of same.  Will follow up to ensure they arrive.   Benson Setting EMT-P Community Paramedic  [phone]

## 2023-01-05 NOTE — Progress Notes (Signed)
Paramedicine Encounter    Patient ID: Charles Gray, male    DOB: 01/19/1956, 67 y.o.   MRN: [id number]   Complaints - constant concern with hemrroids, continued concern with bowels, too constipated, too loose. Complaints about the amount of money he is spending on incontinence supplies. Shaky.   Assessment- caught an episode of tachycardia, self converted, highest HR 134.   Compliance with meds - no missed doses noted  Pill box filled - for 2x weeks.   Refills needed - inspira   Meds changes since last visit - npne    Social changes - decreased socialization and compliance with PCP visits due to decreased mobility and increased incontinence.    VISIT SUMMARY**  Met with Zay in the home today. Jamont reported increased weakness and shakey-ness. Pt has been having increased urination and increased stools. Pt reports continued problems with his cellulitis and hemrroids. Pt advised that he has been continuing to feel like he was going tachycardic. Upon checking his pulse, his heart rate was irregular and would increase as much as 134BPM. Pt was placed on a 3 lead to note the same. Sinus rhythm in the 70's then increased to high 120's low 130's. I contacted triage to inform them of this finding and his recent noted weight loss. Triage reported to hold metolazone on Wednesday. While in the home. [name], Trek's aid, advised that she got the key to his new apartment and will be moving this weekend. Pt was resistent to the change, but was advised that it was what was best for him and his health. Apartment staff was asked for handicap bars and they advise that they would evaluate for same.a Pt's pill box was filled for 2x weeks. Pt was informed of upcoming appointment and advised to follow up with PCP for [employer] and hemrroid concerns. Will follow up in 2x weeks.  BP (!) 154/88   Pulse 78 Comment: irregular  Resp 16   Wt (!) 310 lb 3.2 oz (140.7 kg)   SpO2 96%   BMI 32.44 kg/m  Weight  yesterday-310 lbs Last visit weight - 327 lbs Mems: 5, Goal: 9  Benson Setting EMT-P Community Paramedic  [phone]    ACTION: Home visit completed     Patient Care Team: Nonnie Done., MD as PCP - General (Family Medicine) Laurey Morale, MD as PCP - Cardiology (Cardiology) [name], LCSW as Social Worker (Licensed Clinical Social Worker)  Patient Active Problem List   Diagnosis Date Noted   Acute on chronic heart failure with preserved ejection fraction (HFpEF) (HCC) 03/04/2022   Dyslipidemia 07/17/2021   Lymphedema 07/08/2021   Melena    Gastric polyps    UGIB (upper gastrointestinal bleed) 04/06/2021   Acute encephalopathy 12/21/2020   COPD (chronic obstructive pulmonary disease) (HCC) 12/21/2020   Chronic diastolic (congestive) heart failure (HCC) 11/14/2020   Syncope 10/28/2020   CHF (congestive heart failure) (HCC) 10/28/2020   Chronic venous stasis dermatitis of both lower extremities 08/27/2020   Cellulitis of both lower extremities 08/14/2020   Iron deficiency anemia 08/14/2020   GERD without esophagitis 06/10/2020   Atrial fibrillation with rapid ventricular response (HCC) 03/24/2020   Acute on chronic right heart failure (HCC) 01/27/2020   Acute respiratory failure with hypoxia (HCC)    Fatty liver disease, nonalcoholic    Shock (HCC)    Personal history of DVT (deep vein thrombosis) 12/26/2019   Chronic anticoagulation 12/26/2019   Palpitations 12/26/2019   Paroxysmal atrial fibrillation (HCC) 12/23/2019  Heart failure, systolic, acute (HCC) 08/24/2019   Acute on chronic congestive heart failure (HCC)    Acute on chronic diastolic CHF (congestive heart failure) (HCC) 08/23/2019   Chest pain 08/23/2019   Lactic acidosis 08/23/2019   History of pulmonary embolism 08/12/2019   Atrial tachycardia 08/12/2019   Occasional tremors 08/12/2019   OSA (obstructive sleep apnea) 08/12/2019   AKI (acute kidney injury) (HCC) 08/08/2019   Acute on  chronic right-sided heart failure (HCC) 08/06/2019   Acute pulmonary embolism (HCC) 06/08/2019   Normocytic anemia 06/08/2019   Pulmonary emboli (HCC) 06/08/2019   Chronic heart failure with preserved ejection fraction (HCC) 05/30/2019   Class 2 severe obesity due to excess calories with serious comorbidity and body mass index (BMI) of 39.0 to 39.9 in adult (HCC) 05/30/2019   Morbid obesity with BMI of 40.0-44.9, adult (HCC) 05/30/2019   Hyponatremia    Drug induced constipation    Peripheral edema    Hypotension due to drugs    Hypoalbuminemia due to protein-calorie malnutrition (HCC)    Thrombocytopenia (HCC)    Anemia of chronic disease    Cirrhosis of liver without ascites (HCC)    Chronic pain syndrome    Debility 03/13/2019   Portal hypertensive gastropathy (HCC) 03/06/2019   Dyspnea    GIB (gastrointestinal bleeding) 03/04/2019   Mixed hyperlipidemia 01/24/2019   Insomnia 01/24/2019   Hyperlipidemia 01/24/2019   Essential hypertension 01/24/2019   Lumbar disc herniation 01/24/2019    Current Outpatient Medications:    apixaban (ELIQUIS) 5 MG TABS tablet, Take 1 tablet (5 mg total) by mouth 2 (two) times daily., Disp: 60 tablet, Rfl: 3   Buprenorphine HCl-Naloxone HCl 8-2 MG FILM, Place 1 Film under the tongue in the morning and at bedtime., Disp: , Rfl:    eplerenone (INSPRA) 50 MG tablet, Take 2 tablets (100 mg total) by mouth daily., Disp: 60 tablet, Rfl: 11   gabapentin (NEURONTIN) 800 MG tablet, Take 1 tablet (800 mg total) by mouth 3 (three) times daily., Disp: 90 tablet, Rfl: 1   lactulose, encephalopathy, (CHRONULAC) 10 GM/15ML SOLN, Take 30 mLs (20 g total) by mouth 2 (two) times daily., Disp: , Rfl:    metolazone (ZAROXOLYN) 2.5 MG tablet, TAKE ONE TABLET BY MOUTH FOUR DAYS a WEEK. (Monday, Wednesday, Friday, Saturday), Disp: 30 tablet, Rfl: 0   metoprolol succinate (TOPROL XL) 25 MG 24 hr tablet, Take 1 tablet (25 mg total) by mouth at bedtime., Disp: 30 tablet,  Rfl: 11   ondansetron (ZOFRAN-ODT) 4 MG disintegrating tablet, Take 4 mg by mouth every 8 (eight) hours as needed for nausea or vomiting (dissolve orally)., Disp: , Rfl:    pantoprazole (PROTONIX) 40 MG tablet, Take 40 mg by mouth 2 (two) times daily before a meal., Disp: , Rfl:    polyethylene glycol (MIRALAX / GLYCOLAX) 17 g packet, Take 17 g by mouth as needed for mild constipation (mix and drink as directed)., Disp: , Rfl:    potassium chloride (KLOR-CON) 20 MEQ packet, Take 100 mEq by mouth 3 (three) times daily. Take an extra 40 mEq on days when you take metolazone, Disp: 475 packet, Rfl: 3   pravastatin (PRAVACHOL) 20 MG tablet, Take 1 tablet (20 mg total) by mouth daily., Disp: 30 tablet, Rfl: 11   sertraline (ZOLOFT) 50 MG tablet, Take 1 tablet (50 mg total) by mouth daily. (Patient taking differently: Take 100 mg by mouth daily. Per Dr. Orvan Falconer), Disp: 30 tablet, Rfl: 3   tamsulosin (FLOMAX) 0.4  MG CAPS capsule, Take 1 capsule (0.4 mg total) by mouth daily. Additional refills will need to come from PCP, Disp: 30 capsule, Rfl: 0   torsemide (DEMADEX) 100 MG tablet, TAKE ONE TABLET BY MOUTH DAILY IN THE MORNING AND EVENING, Disp: 90 tablet, Rfl: 3   triamcinolone (KENALOG) 0.025 % ointment, Apply 1 application. topically 2 (two) times daily., Disp: 30 g, Rfl: 0   VENTOLIN HFA 108 (90 Base) MCG/ACT inhaler, Inhale 1 puff into the lungs every 4 (four) hours as needed for shortness of breath. (Patient taking differently: Inhale 2 puffs into the lungs every 4 (four) hours as needed for shortness of breath.), Disp: 6.7 g, Rfl: 1   zolpidem (AMBIEN) 10 MG tablet, Take 1 tablet (10 mg total) by mouth at bedtime as needed for sleep., Disp: 30 tablet, Rfl: 0 Allergies  Allergen Reactions   Penicillins Other (See Comments)    Caused convulsions as a child  Did it involve swelling of the face/tongue/throat, SOB, or low BP? N/A Did it involve sudden or severe rash/hives, skin peeling, or any reaction  on the inside of your mouth or nose? N/A Did you need to seek medical attention at a hospital or doctor's office?N/A When did it last happen? N/A If all above answers are "NO", may proceed with cephalosporin use.     Social History   Socioeconomic History   Marital status: Single    Spouse name: Not on file   Number of children: Not on file   Years of education: Not on file   Highest education level: Not on file  Occupational History   Not on file  Tobacco Use   Smoking status: Never   Smokeless tobacco: Never  Vaping Use   Vaping status: Never Used  Substance and Sexual Activity   Alcohol use: Not Currently   Drug use: Not Currently    Types: Marijuana    Comment: Smoked for 30 years   Sexual activity: Not on file  Other Topics Concern   Not on file  Social History Narrative   Not on file   Social Determinants of Health   Financial Resource Strain: Medium Risk (11/15/2020)   Overall Financial Resource Strain (CARDIA)    Difficulty of Paying Living Expenses: Somewhat hard  Food Insecurity: No Food Insecurity (03/08/2022)   Hunger Vital Sign    Worried About Running Out of Food in the Last Year: Never true    Ran Out of Food in the Last Year: Never true  Transportation Needs: Unmet Transportation Needs (04/21/2022)   PRAPARE - Administrator, Civil Service (Medical): Yes    Lack of Transportation (Non-Medical): Yes  Physical Activity: Not on file  Stress: Not on file  Social Connections: Not on file  Intimate Partner Violence: Not At Risk (03/08/2022)   Humiliation, Afraid, Rape, and Kick questionnaire    Fear of Current or Ex-Partner: No    Emotionally Abused: No    Physically Abused: No    Sexually Abused: No    Physical Exam      Future Appointments  Date Time Provider Department Center  02/01/2023  2:00 PM Laurey Morale, MD [facility] None

## 2023-01-05 NOTE — Telephone Encounter (Signed)
Pt aware  Sarah with para medicine aware

## 2023-01-07 ENCOUNTER — Telehealth (HOSPITAL_COMMUNITY): Payer: Self-pay | Admitting: Emergency Medicine

## 2023-01-07 NOTE — Telephone Encounter (Signed)
Spoke with [employer] about the order sent on 01/05/23. They advised that they were waiting on the doctor's office to return paperwork.   Contacted Dr. Molinda Bailiff office to inquire and they advised they returned the forms to [employer] last night at the end of shift.  Will follow up to ensure they have everything they need for delivery.   Benson Setting EMT-P Community Paramedic  [phone]

## 2023-01-11 ENCOUNTER — Encounter (HOSPITAL_COMMUNITY): Payer: Medicare HMO | Admitting: Cardiology

## 2023-01-11 ENCOUNTER — Telehealth (HOSPITAL_COMMUNITY): Payer: Self-pay | Admitting: Emergency Medicine

## 2023-01-11 NOTE — Telephone Encounter (Signed)
Followed up with [employer] this am on Colum's incontinence order. [employer] advised that they were located in [place] and could have an impact on their timliness of the order, however they are still waiting on the doctor's approval. They advised that the doctor's office contacted them this morning for further information. Will continue to follow up throughout the week.   Benson Setting EMT-P Community Paramedic  [phone]

## 2023-01-12 ENCOUNTER — Telehealth (HOSPITAL_COMMUNITY): Payer: Self-pay | Admitting: Emergency Medicine

## 2023-01-12 NOTE — Telephone Encounter (Signed)
Received a call from [facility] reporting that he has continued leg pain. Pt was advised to follow up with his PCP.   Pt reported that he has officially moved down stairs to a lower level apartment, making appointment compliance easier.   Pt also advised that he found a depends that worked for him. [employer].    Will follow up with [employer] on same.   Benson Setting EMT-P Community Paramedic  [phone]

## 2023-01-12 NOTE — Telephone Encounter (Signed)
Opened in error

## 2023-01-12 NOTE — Telephone Encounter (Signed)
[name] contacted me today regarding Charles Gray. Pt advised that they completed the reading but there was an error message upon transmission. Aid reached out inquiring about same.   I called and spoke to [facility], they advised that with the change of the system/URL, there had been some weird readings, but his had come through.  No further action required.   Benson Setting EMT-P Community Paramedic  [phone]

## 2023-01-19 ENCOUNTER — Other Ambulatory Visit (HOSPITAL_COMMUNITY): Payer: Self-pay | Admitting: Emergency Medicine

## 2023-01-19 NOTE — Progress Notes (Signed)
Paramedicine Encounter    Patient ID: Charles Gray, male    DOB: 04-20-1955, 67 y.o.   MRN: [id number]   Complaints - pain in legs. Dizziness reported during ambulation.   Assessment - swollen, red, weeping legs.   Compliance with meds - 1 missed evening dose  Pill box filled for 2x weeks   Refills needed - metoprolol, pravastatin, and sertaline.   Meds changes since last visit - none    Social changes - moved apartments down stairs.    VISIT SUMMARY**  Met with [name] in the home today. Pt complained of continued leg pain and incontinence. Pt was given 3x bags of donated incontinence supplies during his wait for [employer] to be delivered. Pt received a phone call today advising that his PCP will not send authorization for same since pt has never been evaluated for incontinence. PCP requests pt makes an appointment. Pt was urged and explained the importance of being evaluated by his PCP. Pt reported that he would work on it and refused for me to call and make an appointment for him, advising that he had to wait on his aid's availability. Pt has access to transportation, but just doesn't want to [place] them. Pt was explained that no improvement would come without being evaluated by his PCP, his legs will hurt, he will continue to pee on himself, and spend his money on incontinence supplies. Pt has resources waiting on him and has been advised that he has to make the effort to go. Pt understood and advised again that he would work on it. Pt's pill box was filled for 2x weeks. Will follow up in 2x weeks.   BP 132/78   Pulse 78   Resp 16   Wt (!) 313 lb (142 kg)   SpO2 97%   BMI 32.73 kg/m  Weight yesterday-313lbs Last visit weight-310 lbs CardioMems: 4, goal 9.   Benson Setting EMT-P Community Paramedic  [phone]     ACTION: Home visit completed     Patient Care Team: Nonnie Done., MD as PCP - General (Family Medicine) Laurey Morale, MD as PCP - Cardiology  (Cardiology) [name], LCSW as Social Worker (Licensed Clinical Social Worker)  Patient Active Problem List   Diagnosis Date Noted   Acute on chronic heart failure with preserved ejection fraction (HFpEF) (HCC) 03/04/2022   Dyslipidemia 07/17/2021   Lymphedema 07/08/2021   Melena    Gastric polyps    UGIB (upper gastrointestinal bleed) 04/06/2021   Acute encephalopathy 12/21/2020   COPD (chronic obstructive pulmonary disease) (HCC) 12/21/2020   Chronic diastolic (congestive) heart failure (HCC) 11/14/2020   Syncope 10/28/2020   CHF (congestive heart failure) (HCC) 10/28/2020   Chronic venous stasis dermatitis of both lower extremities 08/27/2020   Cellulitis of both lower extremities 08/14/2020   Iron deficiency anemia 08/14/2020   GERD without esophagitis 06/10/2020   Atrial fibrillation with rapid ventricular response (HCC) 03/24/2020   Acute on chronic right heart failure (HCC) 01/27/2020   Acute respiratory failure with hypoxia (HCC)    Fatty liver disease, nonalcoholic    Shock (HCC)    Personal history of DVT (deep vein thrombosis) 12/26/2019   Chronic anticoagulation 12/26/2019   Palpitations 12/26/2019   Paroxysmal atrial fibrillation (HCC) 12/23/2019   Heart failure, systolic, acute (HCC) 08/24/2019   Acute on chronic congestive heart failure (HCC)    Acute on chronic diastolic CHF (congestive heart failure) (HCC) 08/23/2019   Chest pain 08/23/2019   Lactic acidosis 08/23/2019  History of pulmonary embolism 08/12/2019   Atrial tachycardia (HCC) 08/12/2019   Occasional tremors 08/12/2019   OSA (obstructive sleep apnea) 08/12/2019   AKI (acute kidney injury) (HCC) 08/08/2019   Acute on chronic right-sided heart failure (HCC) 08/06/2019   Acute pulmonary embolism (HCC) 06/08/2019   Normocytic anemia 06/08/2019   Pulmonary emboli (HCC) 06/08/2019   Chronic heart failure with preserved ejection fraction (HCC) 05/30/2019   Class 2 severe obesity due to excess  calories with serious comorbidity and body mass index (BMI) of 39.0 to 39.9 in adult [facility]) 05/30/2019   Morbid obesity with BMI of 40.0-44.9, adult (HCC) 05/30/2019   Hyponatremia    Drug induced constipation    Peripheral edema    Hypotension due to drugs    Hypoalbuminemia due to protein-calorie malnutrition (HCC)    Thrombocytopenia (HCC)    Anemia of chronic disease    Cirrhosis of liver without ascites (HCC)    Chronic pain syndrome    Debility 03/13/2019   Portal hypertensive gastropathy (HCC) 03/06/2019   Dyspnea    GIB (gastrointestinal bleeding) 03/04/2019   Mixed hyperlipidemia 01/24/2019   Insomnia 01/24/2019   Hyperlipidemia 01/24/2019   Essential hypertension 01/24/2019   Lumbar disc herniation 01/24/2019    Current Outpatient Medications:    apixaban (ELIQUIS) 5 MG TABS tablet, Take 1 tablet (5 mg total) by mouth 2 (two) times daily., Disp: 60 tablet, Rfl: 3   Buprenorphine HCl-Naloxone HCl 8-2 MG FILM, Place 1 Film under the tongue in the morning and at bedtime., Disp: , Rfl:    eplerenone (INSPRA) 50 MG tablet, Take 2 tablets (100 mg total) by mouth daily., Disp: 60 tablet, Rfl: 11   gabapentin (NEURONTIN) 800 MG tablet, Take 1 tablet (800 mg total) by mouth 3 (three) times daily., Disp: 90 tablet, Rfl: 1   lactulose, encephalopathy, (CHRONULAC) 10 GM/15ML SOLN, Take 30 mLs (20 g total) by mouth 2 (two) times daily., Disp: , Rfl:    metolazone (ZAROXOLYN) 2.5 MG tablet, TAKE ONE TABLET BY MOUTH FOUR DAYS a WEEK. (Monday, Wednesday, Friday, Saturday), Disp: 30 tablet, Rfl: 0   metoprolol succinate (TOPROL XL) 25 MG 24 hr tablet, Take 1 tablet (25 mg total) by mouth at bedtime., Disp: 30 tablet, Rfl: 11   ondansetron (ZOFRAN-ODT) 4 MG disintegrating tablet, Take 4 mg by mouth every 8 (eight) hours as needed for nausea or vomiting (dissolve orally)., Disp: , Rfl:    pantoprazole (PROTONIX) 40 MG tablet, Take 40 mg by mouth 2 (two) times daily before a meal., Disp: , Rfl:     polyethylene glycol (MIRALAX / GLYCOLAX) 17 g packet, Take 17 g by mouth as needed for mild constipation (mix and drink as directed)., Disp: , Rfl:    potassium chloride (KLOR-CON) 20 MEQ packet, Take 100 mEq by mouth 3 (three) times daily. Take an extra 40 mEq on days when you take metolazone, Disp: 475 packet, Rfl: 3   pravastatin (PRAVACHOL) 20 MG tablet, Take 1 tablet (20 mg total) by mouth daily., Disp: 30 tablet, Rfl: 11   sertraline (ZOLOFT) 50 MG tablet, Take 1 tablet (50 mg total) by mouth daily. (Patient taking differently: Take 100 mg by mouth daily. Per Dr. Orvan Falconer), Disp: 30 tablet, Rfl: 3   tamsulosin (FLOMAX) 0.4 MG CAPS capsule, Take 1 capsule (0.4 mg total) by mouth daily. Additional refills will need to come from PCP, Disp: 30 capsule, Rfl: 0   torsemide (DEMADEX) 100 MG tablet, TAKE ONE TABLET BY MOUTH DAILY IN  THE MORNING AND EVENING, Disp: 90 tablet, Rfl: 3   triamcinolone (KENALOG) 0.025 % ointment, Apply 1 application. topically 2 (two) times daily., Disp: 30 g, Rfl: 0   VENTOLIN HFA 108 (90 Base) MCG/ACT inhaler, Inhale 1 puff into the lungs every 4 (four) hours as needed for shortness of breath. (Patient taking differently: Inhale 2 puffs into the lungs every 4 (four) hours as needed for shortness of breath.), Disp: 6.7 g, Rfl: 1   zolpidem (AMBIEN) 10 MG tablet, Take 1 tablet (10 mg total) by mouth at bedtime as needed for sleep., Disp: 30 tablet, Rfl: 0 Allergies  Allergen Reactions   Penicillins Other (See Comments)    Caused convulsions as a child  Did it involve swelling of the face/tongue/throat, SOB, or low BP? N/A Did it involve sudden or severe rash/hives, skin peeling, or any reaction on the inside of your mouth or nose? N/A Did you need to seek medical attention at a hospital or doctor's office?N/A When did it last happen? N/A If all above answers are "NO", may proceed with cephalosporin use.     Social History   Socioeconomic History   Marital status:  Single    Spouse name: Not on file   Number of children: Not on file   Years of education: Not on file   Highest education level: Not on file  Occupational History   Not on file  Tobacco Use   Smoking status: Never   Smokeless tobacco: Never  Vaping Use   Vaping status: Never Used  Substance and Sexual Activity   Alcohol use: Not Currently   Drug use: Not Currently    Types: Marijuana    Comment: Smoked for 30 years   Sexual activity: Not on file  Other Topics Concern   Not on file  Social History Narrative   Not on file   Social Determinants of Health   Financial Resource Strain: Medium Risk (11/15/2020)   Overall Financial Resource Strain (CARDIA)    Difficulty of Paying Living Expenses: Somewhat hard  Food Insecurity: No Food Insecurity (03/08/2022)   Hunger Vital Sign    Worried About Running Out of Food in the Last Year: Never true    Ran Out of Food in the Last Year: Never true  Transportation Needs: Unmet Transportation Needs (04/21/2022)   PRAPARE - Administrator, Civil Service (Medical): Yes    Lack of Transportation (Non-Medical): Yes  Physical Activity: Not on file  Stress: Not on file  Social Connections: Not on file  Intimate Partner Violence: Not At Risk (03/08/2022)   Humiliation, Afraid, Rape, and Kick questionnaire    Fear of Current or Ex-Partner: No    Emotionally Abused: No    Physically Abused: No    Sexually Abused: No    Physical Exam      Future Appointments  Date Time Provider Department Center  02/01/2023  2:00 PM Laurey Morale, MD [facility] None

## 2023-01-26 DIAGNOSIS — F1120 Opioid dependence, uncomplicated: Secondary | ICD-10-CM | POA: Diagnosis not present

## 2023-01-28 ENCOUNTER — Telehealth (HOSPITAL_COMMUNITY): Payer: Self-pay | Admitting: Emergency Medicine

## 2023-01-28 NOTE — Telephone Encounter (Signed)
Spoke with Salbador this morning to follow up about his [employer] order. Pt reported that he had not received any incontinence supplies.   Pt reported that he made an appointment to see his PCP on 10/22.   Pt was reminded of his Heart Failure Appointment on 10/21 at 2pm. Pt was aware and advised he has asked his son to transport him and he is planning on being present.   Will follow up Monday AM to confirm  Benson Setting EMT-P Community Paramedic  [phone]

## 2023-02-01 ENCOUNTER — Encounter (HOSPITAL_COMMUNITY): Payer: Self-pay | Admitting: Cardiology

## 2023-02-01 ENCOUNTER — Telehealth (HOSPITAL_COMMUNITY): Payer: Self-pay

## 2023-02-01 ENCOUNTER — Ambulatory Visit (HOSPITAL_COMMUNITY)
Admission: RE | Admit: 2023-02-01 | Discharge: 2023-02-01 | Disposition: A | Discharge status: Home / Self Care | Payer: Medicare HMO | Source: Ambulatory Visit | Attending: Cardiology | Admitting: Cardiology

## 2023-02-01 ENCOUNTER — Telehealth (HOSPITAL_COMMUNITY): Payer: Self-pay | Admitting: Emergency Medicine

## 2023-02-01 ENCOUNTER — Other Ambulatory Visit (HOSPITAL_COMMUNITY): Payer: Self-pay | Admitting: Emergency Medicine

## 2023-02-01 ENCOUNTER — Other Ambulatory Visit (HOSPITAL_COMMUNITY): Payer: Self-pay

## 2023-02-01 VITALS — BP 102/60 | HR 72 | Wt 314.0 lb

## 2023-02-01 DIAGNOSIS — K766 Portal hypertension: Secondary | ICD-10-CM | POA: Insufficient documentation

## 2023-02-01 DIAGNOSIS — Z86718 Personal history of other venous thrombosis and embolism: Secondary | ICD-10-CM | POA: Insufficient documentation

## 2023-02-01 DIAGNOSIS — Z79899 Other long term (current) drug therapy: Secondary | ICD-10-CM | POA: Insufficient documentation

## 2023-02-01 DIAGNOSIS — I110 Hypertensive heart disease with heart failure: Secondary | ICD-10-CM | POA: Insufficient documentation

## 2023-02-01 DIAGNOSIS — I872 Venous insufficiency (chronic) (peripheral): Secondary | ICD-10-CM | POA: Diagnosis not present

## 2023-02-01 DIAGNOSIS — F32A Depression, unspecified: Secondary | ICD-10-CM | POA: Insufficient documentation

## 2023-02-01 DIAGNOSIS — Z86711 Personal history of pulmonary embolism: Secondary | ICD-10-CM | POA: Diagnosis not present

## 2023-02-01 DIAGNOSIS — Z7901 Long term (current) use of anticoagulants: Secondary | ICD-10-CM | POA: Diagnosis not present

## 2023-02-01 DIAGNOSIS — I5032 Chronic diastolic (congestive) heart failure: Secondary | ICD-10-CM | POA: Diagnosis not present

## 2023-02-01 DIAGNOSIS — F419 Anxiety disorder, unspecified: Secondary | ICD-10-CM | POA: Insufficient documentation

## 2023-02-01 DIAGNOSIS — G4733 Obstructive sleep apnea (adult) (pediatric): Secondary | ICD-10-CM | POA: Diagnosis not present

## 2023-02-01 DIAGNOSIS — I890 Lymphedema, not elsewhere classified: Secondary | ICD-10-CM | POA: Diagnosis not present

## 2023-02-01 DIAGNOSIS — K5909 Other constipation: Secondary | ICD-10-CM | POA: Diagnosis not present

## 2023-02-01 DIAGNOSIS — K3189 Other diseases of stomach and duodenum: Secondary | ICD-10-CM | POA: Insufficient documentation

## 2023-02-01 DIAGNOSIS — K30 Functional dyspepsia: Secondary | ICD-10-CM | POA: Insufficient documentation

## 2023-02-01 DIAGNOSIS — I480 Paroxysmal atrial fibrillation: Secondary | ICD-10-CM | POA: Diagnosis not present

## 2023-02-01 LAB — BASIC METABOLIC PANEL
Anion gap: 9 (ref 5–15)
BUN: 11 mg/dL (ref 8–23)
CO2: 32 mmol/L (ref 22–32)
Calcium: 8.7 mg/dL — ABNORMAL LOW (ref 8.9–10.3)
Chloride: 93 mmol/L — ABNORMAL LOW (ref 98–111)
Creatinine, Ser: 1.1 mg/dL (ref 0.61–1.24)
GFR, Estimated: >60 mL/min (ref >60)
Glucose, Bld: 132 mg/dL — ABNORMAL HIGH (ref 70–99)
Potassium: 2.7 mmol/L — CL (ref 3.5–5.1)
Sodium: 134 mmol/L — ABNORMAL LOW (ref 135–145)

## 2023-02-01 LAB — CBC
HCT: 33.4 % — ABNORMAL LOW (ref 39.0–52.0)
Hemoglobin: 10 g/dL — ABNORMAL LOW (ref 13.0–17.0)
MCH: 22.9 pg — ABNORMAL LOW (ref 26.0–34.0)
MCHC: 29.9 g/dL — ABNORMAL LOW (ref 30.0–36.0)
MCV: 76.6 fL — ABNORMAL LOW (ref 80.0–100.0)
Platelets: 115 10*3/uL — ABNORMAL LOW (ref 150–400)
RBC: 4.36 MIL/uL (ref 4.22–5.81)
RDW: 16.2 % — ABNORMAL HIGH (ref 11.5–15.5)
WBC: 5.2 10*3/uL (ref 4.0–10.5)
nRBC: 0 % (ref 0.0–0.2)

## 2023-02-01 LAB — BRAIN NATRIURETIC PEPTIDE: B Natriuretic Peptide: 76.8 pg/mL (ref 0.0–100.0)

## 2023-02-01 LAB — LIPID PANEL
Cholesterol: 102 mg/dL (ref 0–200)
HDL: 42 mg/dL (ref >40)
LDL Cholesterol: 44 mg/dL (ref 0–99)
Total CHOL/HDL Ratio: 2.4 {ratio}
Triglycerides: 80 mg/dL (ref <150)
VLDL: 16 mg/dL (ref 0–40)

## 2023-02-01 MED ORDER — FARXIGA 10 MG PO TABS: 10.0000 mg | ORAL_TABLET | Freq: Every day | ORAL | 11 refills | Status: DC

## 2023-02-01 NOTE — Telephone Encounter (Signed)
Got critical result for potassium of 2.7. called and left message on patient and sons phone to call the office back.  Per Dr. Shirlee Latch, needs to take his potassium as prescribed and to get repeat lab work on Thursday. Order for [facility] already in

## 2023-02-01 NOTE — Progress Notes (Unsigned)
Paramedicine Encounter   Patient ID: Charles Gray , male,   DOB: 1955-04-15,67 y.o.,  MRN: [id number]   Met Mr. Charles Gray in clinic today with Dr. Shirlee Latch. Dr. Shirlee Latch was pleased with his progress. Dr. Shirlee Latch advised that he would like to refer pt to wound clinic for legs due to their state. Pt denied and requested that he just be managed by his Pcp. Shirlee Latch urged just one appointment for now. Pt was compliant with same. Dr. Shirlee Latch wanted to start Charles Gray on Farxiga, no samples at clinic at present. Will follow up on Tuesday if they are in from the pharmacy.   Weight @ clinic - 315 lbs Weight @ home - 307 lbs B/P-102/60 P-72 SP02-98 %  Med changes- add Farxiga, ensure taking potassium TID. Will begin adding to pill box to manage.   Benson Setting EMT-P Community Paramedic  [phone]

## 2023-02-01 NOTE — Patient Instructions (Signed)
START Farxiga 10mg  daily.  Labs done today, your results will be available in MyChart, we will contact you for abnormal readings.  Repeat blood work in 10 days.  Your physician recommends that you schedule a follow-up appointment in: 3 months.  If you have any questions or concerns before your next appointment please send [place] a message through [place] or call our office at [phone].    TO LEAVE A MESSAGE FOR THE NURSE SELECT OPTION 2, PLEASE LEAVE A MESSAGE INCLUDING: YOUR NAME DATE OF BIRTH CALL BACK NUMBER REASON FOR CALL**this is important as we prioritize the call backs  YOU WILL RECEIVE A CALL BACK THE SAME DAY AS LONG AS YOU CALL BEFORE 4:00 PM  At the [facility], you and your health needs are our priority. As part of our continuing mission to provide you with exceptional heart care, we have created designated Provider Care Teams. These Care Teams include your primary Cardiologist (physician) and Advanced Practice Providers (APPs- Physician Assistants and Nurse Practitioners) who all work together to provide you with the care you need, when you need it.   You may see any of the following providers on your designated Care Team at your next follow up: Dr Arvilla Meres Dr Marca Ancona Dr. Dorthula Nettles Dr. Clearnce Hasten [name] Filbert Schilder, NP Robbie Lis, [place] [facility], [place] Brynda Peon, NP [place] Lee, NP Karle Plumber, PharmD   Please be sure to bring in all your medications bottles to every appointment.    Thank you for choosing [facility]

## 2023-02-01 NOTE — Progress Notes (Signed)
ADVANCED HF CLINIC NOTE  PCP: Nonnie Done., MD Primary Cardiologist: Dr. Thomasene Ripple, DO  HF Cardiology: Dr. Shirlee Latch  HPI: 67 y.o. 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    He has had multiple hospitalizations in 2021 for CHF exacerbation, requiring IV Lasix and change in PO regimen from lasix to torsemide. Echo 4/21 showed normal LVEF 55-60% w/ G1DD. RV not well visualized, but felt to have component of RV failure. RHC 4/21 showed normal PA pressure and normal cardiac output. PFTs also completed and c/w mild obstructive airway disease.   In 7/21, he had a coronary CTA.  This showed no coronary calcium but there was a concern for soft plaque in the distal LCx and distal RCA with hemodynamically significant stenosis.  He was managed medically. He has not had any chest pain, but was started on ranolazine after his CTA.  He feels like it makes his "head fuzzy."    Admitted with A/C diastolic heart failure 01/2020. Diuresed with IV lasix and transitioned to bumex 3 mg twice a day. Discharge weight 360 pounds.   He was admitted again in 12/21 with CHF.  Echo in 12/21 showed EF 60-65%, grade 2 diastolic dysfunction, RV poorly visualized.   Admitted 06/09/20 with A/C diastolic heart failure. Diuresed with IV lasix + metolazone. Transitioned to torsemide 60 mg twice a day and metolazone twice a day. D/C weight 364 pounds.   Admitted 5/28//22 with cellulitis. Treated with antibiotics.   Admitted 09/20/20 with  A/C CHF. He diuresed 5 L with IV lasix, weight 371 at discharge.  He was readmitted for A/C CHF 7/22. He was diuresed with IV lasix + metolazone. RHC showed  optimized filling pressures and preserved CO/CI. He was discharged on torsemide 100 mg bid + metolazone M/W/F. Discharge weight 374 lbs.  RHC (8/22) showed normal filling pressures & he underwent Cardiomems placement.  Admitted  9/10-9/12/22 for acute encephalopathy, felt secondary to polypharmacy. Ambien and gabapentin were briefly held. Ammonia mildly elevated and he was started on lactulose. Mentation returned to baseline and he was discharged home with Home Health PT/OT.  Saw ID for suspected cellulitis. No active infection identified. Referral to lymphedema clinic submitted.   Admitted 3/23 with a/c dHF and worsening LE edema. Echo showed EF 60-65% RV normal this admission. He was diuresed with IV Lasix, down 20 lbs. LE venous dopplers negative DVT. Hospitalization complicated by severe  hypokalemia, discharged home on KCL 100 tid, weight 377.7 lbs. Cardiomems goal lowered to 9.  Admitted 11/23 with CHF exacerbation, weight up to 407 lbs, despite normal PADP readings. He was diuresed with IV lasix and metolazone.  Echo showed EF 55-60%, RV not well-visualized. Discharged home, weight 385 lbs.  Patient returns today for followup of CHF.  He has managed to stay out of the hospital for almost a year.  Weight is down 60 lbs since last appointment.  He has cut back on po intake.  Ambulation is limited by right knee instability, "gives out" and he will fall.  He uses a walker.  He is short of breath whenever he walks more than about 20 feet, this is chronic.  He has slept in a recliner for > 2 years. He had an episode of palpitations about 2 months ago that he thinks was atrial fibrillation, no recurrence since then.  He has chronic venous insufficiency with discolored lower legs and small ulcerations with weeping.  Cardiomems: PADP 7 (goal 9).   ECG (personally reviewed): NSR, LBBB 136 msec  PMH: 1. HTN 2. OSA 3. Hyperlipidemia 4. Low back pain 5. PE/DVT in 2/21.  - V/Q scan in 6/21 was not suggestive of chronic PE.  6. Nonalcoholic steatohepatitis.  7. GI bleed in 10/20.  8. Anxiety 9. CAD: Coronary CTA in 7/21 with calcium score 0 but concern for soft plaque distal CFX and RCA.  By [facility], there was flow-limiting  stenosis in distal CFX and distal RCA.  10. Chronic diastolic CHF: Echo in 4/21 with EF 55-60%, RV poorly visualized.  - RHC (4/21): mean RA 8, PA 20/8, mean PCWP 11, CI 2.8 - Echo (12/21): EF 60-65%, grade 2 diastolic dysfunction, RV poorly visualized.   - RHC (8/22) normal filling pressures, RA mean 7, RV 29/9, PA 32/15, mean 22, PCWP mean 13, CO/CI 7.32/2.41. - Cardiomems (8/22) placement. - Echo (3/23): EF 60-65%, mild LVH, normal RV - Echo (11/23): EF 55-60%, RV not well-visualized.  11. PFTs (4/21): Mild obstruction.  12. ABIs (10/21): Normal. 13. Fe deficiency anemia.   Review of Systems: All systems reviewed and negative except as per HPI.   Current Outpatient Medications  Medication Sig Dispense Refill   apixaban (ELIQUIS) 5 MG TABS tablet Take 1 tablet (5 mg total) by mouth 2 (two) times daily. 60 tablet 3   Buprenorphine HCl-Naloxone HCl 8-2 MG FILM Place 1 Film under the tongue in the morning and at bedtime.     eplerenone (INSPRA) 50 MG tablet Take 2 tablets (100 mg total) by mouth daily. 60 tablet 11   FARXIGA 10 MG TABS tablet Take 1 tablet (10 mg total) by mouth daily before breakfast. 30 tablet 11   gabapentin (NEURONTIN) 800 MG tablet Take 1 tablet (800 mg total) by mouth 3 (three) times daily. 90 tablet 1   lactulose, encephalopathy, (CHRONULAC) 10 GM/15ML SOLN Take 30 mLs (20 g total) by mouth 2 (two) times daily.     metolazone (ZAROXOLYN) 2.5 MG tablet Take 2.5 mg by mouth 3 (three) times a week.     metoprolol succinate (TOPROL XL) 25 MG 24 hr tablet Take 1 tablet (25 mg total) by mouth at bedtime. 30 tablet 11   ondansetron (ZOFRAN-ODT) 4 MG disintegrating tablet Take 4 mg by mouth every 8 (eight) hours as needed for nausea or vomiting (dissolve orally).     pantoprazole (PROTONIX) 40 MG tablet Take 40 mg by mouth 2 (two) times daily before a meal.     polyethylene glycol (MIRALAX / GLYCOLAX) 17 g packet Take 17 g by mouth as needed for mild constipation (mix and  drink as directed).     potassium chloride (KLOR-CON) 20 MEQ packet Take 100 mEq by mouth 3 (three) times daily. Take an extra 40 mEq on days when you take metolazone 475 packet 3   pravastatin (PRAVACHOL) 20 MG tablet Take 1 tablet (20 mg total) by mouth daily. 30 tablet 11   sertraline (ZOLOFT) 100 MG tablet Take 100 mg by mouth daily.     tamsulosin (FLOMAX) 0.4 MG CAPS capsule Take 1 capsule (0.4 mg total) by mouth daily. Additional refills will need to come from PCP 30 capsule 0   torsemide (DEMADEX) 100 MG tablet TAKE ONE TABLET BY MOUTH DAILY IN THE MORNING AND EVENING 90 tablet 3   triamcinolone (KENALOG) 0.025 % ointment Apply 1 application. topically 2 (two) times daily. 30 g 0   VENTOLIN HFA 108 (90 Base) MCG/ACT inhaler Inhale  1 puff into the lungs every 4 (four) hours as needed for shortness of breath. (Patient taking differently: Inhale 2 puffs into the lungs every 4 (four) hours as needed for shortness of breath.) 6.7 g 1   zolpidem (AMBIEN) 10 MG tablet Take 1 tablet (10 mg total) by mouth at bedtime as needed for sleep. 30 tablet 0   No current facility-administered medications for this encounter.   Allergies  Allergen Reactions   Penicillins Other (See Comments)    Caused convulsions as a child  Did it involve swelling of the face/tongue/throat, SOB, or low BP? N/A Did it involve sudden or severe rash/hives, skin peeling, or any reaction on the inside of your mouth or nose? N/A Did you need to seek medical attention at a hospital or doctor's office?N/A When did it last happen? N/A If all above answers are "NO", may proceed with cephalosporin use.   Social History   Socioeconomic History   Marital status: Single    Spouse name: Not on file   Number of children: Not on file   Years of education: Not on file   Highest education level: Not on file  Occupational History   Not on file  Tobacco Use   Smoking status: Never   Smokeless tobacco: Never  Vaping Use   Vaping  status: Never Used  Substance and Sexual Activity   Alcohol use: Not Currently   Drug use: Not Currently    Types: Marijuana    Comment: Smoked for 30 years   Sexual activity: Not on file  Other Topics Concern   Not on file  Social History Narrative   Not on file   Social Determinants of Health   Financial Resource Strain: Medium Risk (11/15/2020)   Overall Financial Resource Strain (CARDIA)    Difficulty of Paying Living Expenses: Somewhat hard  Food Insecurity: No Food Insecurity (03/08/2022)   Hunger Vital Sign    Worried About Running Out of Food in the Last Year: Never true    Ran Out of Food in the Last Year: Never true  Transportation Needs: Unmet Transportation Needs (04/21/2022)   PRAPARE - Administrator, Civil Service (Medical): Yes    Lack of Transportation (Non-Medical): Yes  Physical Activity: Not on file  Stress: Not on file  Social Connections: Not on file  Intimate Partner Violence: Not At Risk (03/08/2022)   Humiliation, Afraid, Rape, and Kick questionnaire    Fear of Current or Ex-Partner: No    Emotionally Abused: No    Physically Abused: No    Sexually Abused: No    Family History  Problem Relation Age of Onset   Hyperlipidemia Mother    Hypertension Mother    Stroke Mother    CAD Maternal Grandmother    CAD Maternal Grandfather    BP 102/60   Pulse 72   Wt (!) 142.4 kg (314 lb)   SpO2 98%   BMI 32.83 kg/m   Wt Readings from Last 3 Encounters:  02/01/23 (!) 142.4 kg (314 lb)  01/19/23 (!) 142 kg (313 lb)  01/05/23 (!) 140.7 kg (310 lb 3.2 oz)   PHYSICAL EXAM: General: NAD Neck: No JVD, no thyromegaly or thyroid nodule.  Lungs: Clear to auscultation bilaterally with normal respiratory effort. CV: Nondisplaced PMI.  Heart regular S1/S2, no S3/S4, no murmur.  1+ chronic edema to knees.  No carotid bruit.  Normal pedal pulses.  Abdomen: Soft, nontender, no hepatosplenomegaly, no distention.  Skin: Intact without lesions  or rashes.   Neurologic: Alert and oriented x 3.  Psych: Normal affect. Extremities: No clubbing or cyanosis. Lower legs erythematous with small ulcerations and weeping.  HEENT: Normal.   ASSESSMENT & PLAN: 1. Chronic diastolic CHF with suspected RV dysfunction: Echo 7/22 with EF 60-65%, mild LVH, normal RV, unable to estimate PA systolic pressure. Multiple CHF admissions despite aggressive outpatient diuretic regimen and paramedicine.  RHC (7/22) with optimized filling pressures, PCWP 13, CO/CI 7.3/2.4. RHC (8/22) with normal filling pressures; underwent placement of Cardiomems (dPAP goal 9 mmHg).  Echo 11/23 with EF 55-60%, RV not well-visualized. He has lost 60 lbs since last visit.  Chronic NYHA III symptoms, functional class confounded by body habitus and chronic lymphedema. He is not volume overloaded by Cardiomems and no JVD noted. Significant venous insufficiency.  - Refer to wound clinic for evaluation of lower extremity ulcerations/weeping.  Will need to restart Unna boots after wound clinic evaluation.  - Continue torsemide 100 mg bid + metolazone 2.5 mg three days/week, with KCL 100 tid. BMET and BNP today. - Continue eplerenone 100 mg daily.  - Continue Toprol XL 25 mg qhs.  - GI upset with Jardiance, I will try him on Farxiga 10 mg daily.  BMET 10 days.  2. H/o PE/DVT: Suspect due to sedentary lifestyle.  Occurred in 2/21.  V/Q scan in 6/21 did not show evidence for chronic PE.   - Continue Eliquis. Check CBC today. 3. OSA: Cannot tolerate CPAP. 4. Anxiety/depression: This is a major issue.   - Continue sertraline 100 mg daily.  5. Hypertension: BP not elevated.  6. Atrial fibrillation: Paroxysmal. NSR today.  Rare symptoms.   - Continue apixaban.  - Continue Toprol XL. 7. Cirrhosis: CT scan in past noted nodular contour of liver, compatible with underlying cirrhosis. In setting of known RV dysfunction and suspect cardiogenic. LFTs ok. EGD 12/22 showed portal hypertensive gastropathy, no  varices.  - On lactulose (also takes this for chronic constipation)    Continue paramedicine, appreciate their help.   Follow up in 3 months with APP  Marca Ancona 02/01/23

## 2023-02-01 NOTE — Telephone Encounter (Signed)
Spoke with Charles Gray and reminded him of his appointment today at 2. Pt reported that he was aware and confirmed his transportation for same. I will follow up with pt in the clinic today.   Benson Setting EMT-P Community Paramedic  [phone]

## 2023-02-02 ENCOUNTER — Telehealth (HOSPITAL_COMMUNITY): Payer: Self-pay | Admitting: Cardiology

## 2023-02-02 ENCOUNTER — Other Ambulatory Visit (HOSPITAL_COMMUNITY): Payer: Self-pay | Admitting: Emergency Medicine

## 2023-02-02 DIAGNOSIS — I5032 Chronic diastolic (congestive) heart failure: Secondary | ICD-10-CM

## 2023-02-02 NOTE — Telephone Encounter (Signed)
Patient called.  Patient aware via Barbara Cower (son) Repeat labs at [facility] ORDER PLACED

## 2023-02-02 NOTE — Progress Notes (Unsigned)
Paramedicine Encounter    Patient ID: Charles Gray, male    DOB: [birth year], 67 y.o.   MRN: [id number]   Complaints***  Assessment***  Compliance with meds***  Pill box filled***  Refills needed***  Meds changes since last visit***    Social changes***   VISIT SUMMARY**  There were no vitals taken for this visit. Weight yesterday-*** Last visit weight-314 lbs Cardio Mems: 5     ACTION: {Paramed [phone]}     Patient Care Team: Nonnie Done., MD as PCP - General (Family Medicine) Laurey Morale, MD as PCP - Cardiology (Cardiology) [name], LCSW as Social Worker (Licensed Clinical Social Worker)  Patient Active Problem List   Diagnosis Date Noted  . Acute on chronic heart failure with preserved ejection fraction (HFpEF) (HCC) 03/04/2022  . Dyslipidemia 07/17/2021  . Lymphedema 07/08/2021  . Melena   . Gastric polyps   . UGIB (upper gastrointestinal bleed) 04/06/2021  . Acute encephalopathy 12/21/2020  . COPD (chronic obstructive pulmonary disease) (HCC) 12/21/2020  . Chronic diastolic (congestive) heart failure (HCC) 11/14/2020  . Syncope 10/28/2020  . CHF (congestive heart failure) (HCC) 10/28/2020  . Chronic venous stasis dermatitis of both lower extremities 08/27/2020  . Cellulitis of both lower extremities 08/14/2020  . Iron deficiency anemia 08/14/2020  . GERD without esophagitis 06/10/2020  . Atrial fibrillation with rapid ventricular response (HCC) 03/24/2020  . Acute on chronic right heart failure (HCC) 01/27/2020  . Acute respiratory failure with hypoxia (HCC)   . Fatty liver disease, nonalcoholic   . Shock (HCC)   . Personal history of DVT (deep vein thrombosis) 12/26/2019  . Chronic anticoagulation 12/26/2019  . Palpitations 12/26/2019  . Paroxysmal atrial fibrillation (HCC) 12/23/2019  . Heart failure, systolic, acute (HCC) 08/24/2019  . Acute on chronic congestive heart failure (HCC)   . Acute on chronic diastolic  CHF (congestive heart failure) (HCC) 08/23/2019  . Chest pain 08/23/2019  . Lactic acidosis 08/23/2019  . History of pulmonary embolism 08/12/2019  . Atrial tachycardia (HCC) 08/12/2019  . Occasional tremors 08/12/2019  . OSA (obstructive sleep apnea) 08/12/2019  . AKI (acute kidney injury) (HCC) 08/08/2019  . Acute on chronic right-sided heart failure (HCC) 08/06/2019  . Acute pulmonary embolism (HCC) 06/08/2019  . Normocytic anemia 06/08/2019  . Pulmonary emboli (HCC) 06/08/2019  . Chronic heart failure with preserved ejection fraction (HCC) 05/30/2019  . Class 2 severe obesity due to excess calories with serious comorbidity and body mass index (BMI) of 39.0 to 39.9 in adult (HCC) 05/30/2019  . Morbid obesity with BMI of 40.0-44.9, adult (HCC) 05/30/2019  . Hyponatremia   . Drug induced constipation   . Peripheral edema   . Hypotension due to drugs   . Hypoalbuminemia due to protein-calorie malnutrition (HCC)   . Thrombocytopenia (HCC)   . Anemia of chronic disease   . Cirrhosis of liver without ascites (HCC)   . Chronic pain syndrome   . Debility 03/13/2019  . Portal hypertensive gastropathy (HCC) 03/06/2019  . Dyspnea   . GIB (gastrointestinal bleeding) 03/04/2019  . Mixed hyperlipidemia 01/24/2019  . Insomnia 01/24/2019  . Hyperlipidemia 01/24/2019  . Essential hypertension 01/24/2019  . Lumbar disc herniation 01/24/2019    Current Outpatient Medications:  .  apixaban (ELIQUIS) 5 MG TABS tablet, Take 1 tablet (5 mg total) by mouth 2 (two) times daily., Disp: 60 tablet, Rfl: 3 .  Buprenorphine HCl-Naloxone HCl 8-2 MG FILM, Place 1 Film under the tongue in the morning and at bedtime.,  Disp: , Rfl:  .  eplerenone (INSPRA) 50 MG tablet, Take 2 tablets (100 mg total) by mouth daily., Disp: 60 tablet, Rfl: 11 .  FARXIGA 10 MG TABS tablet, Take 1 tablet (10 mg total) by mouth daily before breakfast., Disp: 30 tablet, Rfl: 11 .  gabapentin (NEURONTIN) 800 MG tablet, Take 1 tablet  (800 mg total) by mouth 3 (three) times daily., Disp: 90 tablet, Rfl: 1 .  lactulose, encephalopathy, (CHRONULAC) 10 GM/15ML SOLN, Take 30 mLs (20 g total) by mouth 2 (two) times daily., Disp: , Rfl:  .  metolazone (ZAROXOLYN) 2.5 MG tablet, Take 2.5 mg by mouth 3 (three) times a week., Disp: , Rfl:  .  metoprolol succinate (TOPROL XL) 25 MG 24 hr tablet, Take 1 tablet (25 mg total) by mouth at bedtime., Disp: 30 tablet, Rfl: 11 .  ondansetron (ZOFRAN-ODT) 4 MG disintegrating tablet, Take 4 mg by mouth every 8 (eight) hours as needed for nausea or vomiting (dissolve orally)., Disp: , Rfl:  .  pantoprazole (PROTONIX) 40 MG tablet, Take 40 mg by mouth 2 (two) times daily before a meal., Disp: , Rfl:  .  polyethylene glycol (MIRALAX / GLYCOLAX) 17 g packet, Take 17 g by mouth as needed for mild constipation (mix and drink as directed)., Disp: , Rfl:  .  potassium chloride (KLOR-CON) 20 MEQ packet, Take 100 mEq by mouth 3 (three) times daily. Take an extra 40 mEq on days when you take metolazone, Disp: 475 packet, Rfl: 3 .  pravastatin (PRAVACHOL) 20 MG tablet, Take 1 tablet (20 mg total) by mouth daily., Disp: 30 tablet, Rfl: 11 .  sertraline (ZOLOFT) 100 MG tablet, Take 100 mg by mouth daily., Disp: , Rfl:  .  tamsulosin (FLOMAX) 0.4 MG CAPS capsule, Take 1 capsule (0.4 mg total) by mouth daily. Additional refills will need to come from PCP, Disp: 30 capsule, Rfl: 0 .  torsemide (DEMADEX) 100 MG tablet, TAKE ONE TABLET BY MOUTH DAILY IN THE MORNING AND EVENING, Disp: 90 tablet, Rfl: 3 .  triamcinolone (KENALOG) 0.025 % ointment, Apply 1 application. topically 2 (two) times daily., Disp: 30 g, Rfl: 0 .  VENTOLIN HFA 108 (90 Base) MCG/ACT inhaler, Inhale 1 puff into the lungs every 4 (four) hours as needed for shortness of breath. (Patient taking differently: Inhale 2 puffs into the lungs every 4 (four) hours as needed for shortness of breath.), Disp: 6.7 g, Rfl: 1 .  zolpidem (AMBIEN) 10 MG tablet, Take 1  tablet (10 mg total) by mouth at bedtime as needed for sleep., Disp: 30 tablet, Rfl: 0 Allergies  Allergen Reactions  . Penicillins Other (See Comments)    Caused convulsions as a child  Did it involve swelling of the face/tongue/throat, SOB, or low BP? N/A Did it involve sudden or severe rash/hives, skin peeling, or any reaction on the inside of your mouth or nose? N/A Did you need to seek medical attention at a hospital or doctor's office?N/A When did it last happen? N/A If all above answers are "NO", may proceed with cephalosporin use.     Social History   Socioeconomic History  . Marital status: Single    Spouse name: Not on file  . Number of children: Not on file  . Years of education: Not on file  . Highest education level: Not on file  Occupational History  . Not on file  Tobacco Use  . Smoking status: Never  . Smokeless tobacco: Never  Vaping Use  .  Vaping status: Never Used  Substance and Sexual Activity  . Alcohol use: Not Currently  . Drug use: Not Currently    Types: Marijuana    Comment: Smoked for 30 years  . Sexual activity: Not on file  Other Topics Concern  . Not on file  Social History Narrative  . Not on file   Social Determinants of Health   Financial Resource Strain: Medium Risk (11/15/2020)   Overall Financial Resource Strain (CARDIA)   . Difficulty of Paying Living Expenses: Somewhat hard  Food Insecurity: No Food Insecurity (03/08/2022)   Hunger Vital Sign   . Worried About Programme researcher, broadcasting/film/video in the Last Year: Never true   . Ran Out of Food in the Last Year: Never true  Transportation Needs: Unmet Transportation Needs (04/21/2022)   PRAPARE - Transportation   . Lack of Transportation (Medical): Yes   . Lack of Transportation (Non-Medical): Yes  Physical Activity: Not on file  Stress: Not on file  Social Connections: Not on file  Intimate Partner Violence: Not At Risk (03/08/2022)   Humiliation, Afraid, Rape, and Kick questionnaire   . Fear  of Current or Ex-Partner: No   . Emotionally Abused: No   . Physically Abused: No   . Sexually Abused: No    Physical Exam      Future Appointments  Date Time Provider Department Center  05/04/2023  1:30 PM [facility] PA/NP [facility] None

## 2023-02-02 NOTE — Telephone Encounter (Signed)
Son called back via triage line. Aware of instructions and repeat labs

## 2023-02-02 NOTE — Telephone Encounter (Signed)
-----   Message from Marca Ancona sent at 02/01/2023 10:55 PM EDT ----- Good lipids.

## 2023-02-04 ENCOUNTER — Telehealth (HOSPITAL_COMMUNITY): Payer: Self-pay | Admitting: Emergency Medicine

## 2023-02-04 NOTE — Telephone Encounter (Signed)
Charles Gray and his aid called me today and stated that pt had a PCP appointment tomorrow.   They asked if he could have his labs drawn at Dr. Molinda Bailiff office and have them fax the results to the clinic instead of going to [facility].  He also expressed concern about his hemoglobin level and asked if he needed to take an Iron supplement.   Benson Setting EMT-P Community Paramedic  [phone]

## 2023-02-05 DIAGNOSIS — I872 Venous insufficiency (chronic) (peripheral): Secondary | ICD-10-CM | POA: Diagnosis not present

## 2023-02-05 DIAGNOSIS — R32 Unspecified urinary incontinence: Secondary | ICD-10-CM | POA: Diagnosis not present

## 2023-02-05 DIAGNOSIS — Z79899 Other long term (current) drug therapy: Secondary | ICD-10-CM | POA: Diagnosis not present

## 2023-02-05 DIAGNOSIS — M25512 Pain in left shoulder: Secondary | ICD-10-CM | POA: Diagnosis not present

## 2023-02-09 ENCOUNTER — Telehealth (HOSPITAL_COMMUNITY): Payer: Self-pay | Admitting: Emergency Medicine

## 2023-02-09 DIAGNOSIS — R32 Unspecified urinary incontinence: Secondary | ICD-10-CM | POA: Diagnosis not present

## 2023-02-09 NOTE — Telephone Encounter (Signed)
Received a call from [facility] and his aid. They advised that they had a prescription for the incontinence supplies for to complete the [employer] order.   I contacted [employer] and they followed up with me once everything was processed and advised that Kailin's order had been accepted and shipment will begin soon.   I updated pt on same. Will confirm arrival at next home visit.   Benson Setting EMT-P Community Paramedic  [phone]

## 2023-02-10 ENCOUNTER — Telehealth (HOSPITAL_COMMUNITY): Payer: Self-pay | Admitting: Emergency Medicine

## 2023-02-10 DIAGNOSIS — F1120 Opioid dependence, uncomplicated: Secondary | ICD-10-CM | POA: Diagnosis not present

## 2023-02-10 NOTE — Telephone Encounter (Signed)
Advised patient to follow up with his primary regarding his new onset of left lower flank pain per triage recommendation. Pt understood and advised that he would follow up.   Benson Setting EMT-P Community Paramedic  [phone]

## 2023-02-10 NOTE — Telephone Encounter (Signed)
Dishawn and his aid called today to report a new onset of pain to his lower, left flank. Pt described the pain sharp and non-radiating. Pt reports decreased urinary output and is causing him to have increased shortness of breath. Pt denied chest pains or recent falls. Pt reports increased dizziness from his baseline. Pt is concerned that it is a possible side effect of starting the [place]. Pt had a bad reaction with Jardiance, but couldn't advise the symptoms or if this was similar.   BP: 123/72 HR: 77 SPO2: 98% Cardio Mems: 6. Goal 9.   Pt also received a phone call from "[facility]" advising that his "labs were good" [phone]). But unable to see them on my end and unable to confirm this information.   If you have the information of his labs could you please advise.  Pt reported receiving his incontinence supplies from [employer].    [facility] is asking for guidance on how to proceed if he needs to follow up with his PCP for this new onset of pain, or if it is a reaction of the new medication that he needs a medication change.   Benson Setting EMT-P Community Paramedic  [phone]

## 2023-02-16 ENCOUNTER — Other Ambulatory Visit (HOSPITAL_COMMUNITY): Payer: Self-pay | Admitting: Emergency Medicine

## 2023-02-16 NOTE — Progress Notes (Unsigned)
Paramedicine Encounter    Patient ID: Charles Gray, male    DOB: 08-27-1955, 67 y.o.   MRN: [id number]   Complaints - left upper arm pain and right shoulder pain, negative thoughts. Increased nerves.   Assessment - Lung sounds clear, +2 edema at the knee, wraps on legs.   Compliance with meds - pt elected to stop his Marcelline Deist against recommendation from triage. I spoke with him about compliance. And he advised that he would start it back up. No other doses missed.   Pill box filled - for 2x weeks  Refills needed - gabapentin, metoprolol, farxiga, pravastatin  Meds changes since last visit - Farxiga     Social changes - none   VISIT SUMMARY**  Met with Cane today in the home. Pt looked the best he had in months. Pt reported some continual shoulder pain and bruising. Pt being followed by primary for this concern and was referred back to them for any progression of same. Pt reported that he self discontinued farxiga about 2x days ago. Pt was reminded of our conversation and triage's recommendation to continue and follow up with PCP as they did not feel like symptoms were related. Pt advised that he would restart compliance of Farxiga and inform me if he did decide to ultimately quit to refer that information back to the clinic. Pt reported some on going negative thoughts and progression of depression from recent events. Pt was advised of mental health resources, however pt refused all options. Will continue to follow up on this topic in future visits. Pt's pill box was filled for 2x weeks. Will follow up in 2x weeks.   BP (!) 140/72   Pulse 86   Resp 16   Wt (!) 307 lb (139.3 kg)   SpO2 96%   BMI 32.10 kg/m  Weight yesterday-307 lbs Last visit weight-307 lbs CardioMems: 7, goal 9.  [place] Setting EMT-P Community Paramedic  [phone]   ACTION: Home visit completed     Patient Care Team: Nonnie Done., MD as PCP - General (Family Medicine) Laurey Morale, MD as  PCP - Cardiology (Cardiology) [name], LCSW as Social Worker (Licensed Clinical Social Worker)  Patient Active Problem List   Diagnosis Date Noted   Acute on chronic heart failure with preserved ejection fraction (HFpEF) (HCC) 03/04/2022   Dyslipidemia 07/17/2021   Lymphedema 07/08/2021   Melena    Gastric polyps    UGIB (upper gastrointestinal bleed) 04/06/2021   Acute encephalopathy 12/21/2020   COPD (chronic obstructive pulmonary disease) (HCC) 12/21/2020   Chronic diastolic (congestive) heart failure (HCC) 11/14/2020   Syncope 10/28/2020   CHF (congestive heart failure) (HCC) 10/28/2020   Chronic venous stasis dermatitis of both lower extremities 08/27/2020   Cellulitis of both lower extremities 08/14/2020   Iron deficiency anemia 08/14/2020   GERD without esophagitis 06/10/2020   Atrial fibrillation with rapid ventricular response (HCC) 03/24/2020   Acute on chronic right heart failure (HCC) 01/27/2020   Acute respiratory failure with hypoxia (HCC)    Fatty liver disease, nonalcoholic    Shock (HCC)    Personal history of DVT (deep vein thrombosis) 12/26/2019   Chronic anticoagulation 12/26/2019   Palpitations 12/26/2019   Paroxysmal atrial fibrillation (HCC) 12/23/2019   Heart failure, systolic, acute (HCC) 08/24/2019   Acute on chronic congestive heart failure (HCC)    Acute on chronic diastolic CHF (congestive heart failure) (HCC) 08/23/2019   Chest pain 08/23/2019   Lactic acidosis 08/23/2019   History  of pulmonary embolism 08/12/2019   Atrial tachycardia (HCC) 08/12/2019   Occasional tremors 08/12/2019   OSA (obstructive sleep apnea) 08/12/2019   AKI (acute kidney injury) (HCC) 08/08/2019   Acute on chronic right-sided heart failure (HCC) 08/06/2019   Acute pulmonary embolism (HCC) 06/08/2019   Normocytic anemia 06/08/2019   Pulmonary emboli (HCC) 06/08/2019   Chronic heart failure with preserved ejection fraction (HCC) 05/30/2019   Class 2 severe obesity  due to excess calories with serious comorbidity and body mass index (BMI) of 39.0 to 39.9 in adult [facility]) 05/30/2019   Morbid obesity with BMI of 40.0-44.9, adult (HCC) 05/30/2019   Hyponatremia    Drug induced constipation    Peripheral edema    Hypotension due to drugs    Hypoalbuminemia due to protein-calorie malnutrition (HCC)    Thrombocytopenia (HCC)    Anemia of chronic disease    Cirrhosis of liver without ascites (HCC)    Chronic pain syndrome    Debility 03/13/2019   Portal hypertensive gastropathy (HCC) 03/06/2019   Dyspnea    GIB (gastrointestinal bleeding) 03/04/2019   Mixed hyperlipidemia 01/24/2019   Insomnia 01/24/2019   Hyperlipidemia 01/24/2019   Essential hypertension 01/24/2019   Lumbar disc herniation 01/24/2019    Current Outpatient Medications:    apixaban (ELIQUIS) 5 MG TABS tablet, Take 1 tablet (5 mg total) by mouth 2 (two) times daily., Disp: 60 tablet, Rfl: 3   Buprenorphine HCl-Naloxone HCl 8-2 MG FILM, Place 1 Film under the tongue in the morning and at bedtime., Disp: , Rfl:    eplerenone (INSPRA) 50 MG tablet, Take 2 tablets (100 mg total) by mouth daily., Disp: 60 tablet, Rfl: 11   FARXIGA 10 MG TABS tablet, Take 1 tablet (10 mg total) by mouth daily before breakfast., Disp: 30 tablet, Rfl: 11   gabapentin (NEURONTIN) 800 MG tablet, Take 1 tablet (800 mg total) by mouth 3 (three) times daily., Disp: 90 tablet, Rfl: 1   lactulose, encephalopathy, (CHRONULAC) 10 GM/15ML SOLN, Take 30 mLs (20 g total) by mouth 2 (two) times daily., Disp: , Rfl:    metolazone (ZAROXOLYN) 2.5 MG tablet, Take 2.5 mg by mouth 3 (three) times a week., Disp: , Rfl:    metoprolol succinate (TOPROL XL) 25 MG 24 hr tablet, Take 1 tablet (25 mg total) by mouth at bedtime., Disp: 30 tablet, Rfl: 11   ondansetron (ZOFRAN-ODT) 4 MG disintegrating tablet, Take 4 mg by mouth every 8 (eight) hours as needed for nausea or vomiting (dissolve orally)., Disp: , Rfl:    pantoprazole (PROTONIX)  40 MG tablet, Take 40 mg by mouth 2 (two) times daily before a meal., Disp: , Rfl:    polyethylene glycol (MIRALAX / GLYCOLAX) 17 g packet, Take 17 g by mouth as needed for mild constipation (mix and drink as directed)., Disp: , Rfl:    potassium chloride (KLOR-CON) 20 MEQ packet, Take 100 mEq by mouth 3 (three) times daily. Take an extra 40 mEq on days when you take metolazone, Disp: 475 packet, Rfl: 3   pravastatin (PRAVACHOL) 20 MG tablet, Take 1 tablet (20 mg total) by mouth daily., Disp: 30 tablet, Rfl: 11   sertraline (ZOLOFT) 100 MG tablet, Take 100 mg by mouth daily., Disp: , Rfl:    tamsulosin (FLOMAX) 0.4 MG CAPS capsule, Take 1 capsule (0.4 mg total) by mouth daily. Additional refills will need to come from PCP, Disp: 30 capsule, Rfl: 0   torsemide (DEMADEX) 100 MG tablet, TAKE ONE TABLET BY MOUTH  DAILY IN THE MORNING AND EVENING, Disp: 90 tablet, Rfl: 3   triamcinolone (KENALOG) 0.025 % ointment, Apply 1 application. topically 2 (two) times daily., Disp: 30 g, Rfl: 0   VENTOLIN HFA 108 (90 Base) MCG/ACT inhaler, Inhale 1 puff into the lungs every 4 (four) hours as needed for shortness of breath. (Patient taking differently: Inhale 2 puffs into the lungs every 4 (four) hours as needed for shortness of breath.), Disp: 6.7 g, Rfl: 1   zolpidem (AMBIEN) 10 MG tablet, Take 1 tablet (10 mg total) by mouth at bedtime as needed for sleep., Disp: 30 tablet, Rfl: 0 Allergies  Allergen Reactions   Penicillins Other (See Comments)    Caused convulsions as a child  Did it involve swelling of the face/tongue/throat, SOB, or low BP? N/A Did it involve sudden or severe rash/hives, skin peeling, or any reaction on the inside of your mouth or nose? N/A Did you need to seek medical attention at a hospital or doctor's office?N/A When did it last happen? N/A If all above answers are "NO", may proceed with cephalosporin use.     Social History   Socioeconomic History   Marital status: Single     Spouse name: Not on file   Number of children: Not on file   Years of education: Not on file   Highest education level: Not on file  Occupational History   Not on file  Tobacco Use   Smoking status: Never   Smokeless tobacco: Never  Vaping Use   Vaping status: Never Used  Substance and Sexual Activity   Alcohol use: Not Currently   Drug use: Not Currently    Types: Marijuana    Comment: Smoked for 30 years   Sexual activity: Not on file  Other Topics Concern   Not on file  Social History Narrative   Not on file   Social Determinants of Health   Financial Resource Strain: Medium Risk (11/15/2020)   Overall Financial Resource Strain (CARDIA)    Difficulty of Paying Living Expenses: Somewhat hard  Food Insecurity: No Food Insecurity (03/08/2022)   Hunger Vital Sign    Worried About Running Out of Food in the Last Year: Never true    Ran Out of Food in the Last Year: Never true  Transportation Needs: Unmet Transportation Needs (04/21/2022)   PRAPARE - Administrator, Civil Service (Medical): Yes    Lack of Transportation (Non-Medical): Yes  Physical Activity: Not on file  Stress: Not on file  Social Connections: Not on file  Intimate Partner Violence: Not At Risk (03/08/2022)   Humiliation, Afraid, Rape, and Kick questionnaire    Fear of Current or Ex-Partner: No    Emotionally Abused: No    Physically Abused: No    Sexually Abused: No    Physical Exam      Future Appointments  Date Time Provider Department Center  05/04/2023  1:30 PM [facility] PA/NP [facility] None

## 2023-02-18 DIAGNOSIS — L97821 Non-pressure chronic ulcer of other part of left lower leg limited to breakdown of skin: Secondary | ICD-10-CM | POA: Diagnosis not present

## 2023-02-18 DIAGNOSIS — I5032 Chronic diastolic (congestive) heart failure: Secondary | ICD-10-CM | POA: Diagnosis not present

## 2023-02-18 DIAGNOSIS — I5020 Unspecified systolic (congestive) heart failure: Secondary | ICD-10-CM | POA: Diagnosis not present

## 2023-02-18 DIAGNOSIS — L97811 Non-pressure chronic ulcer of other part of right lower leg limited to breakdown of skin: Secondary | ICD-10-CM | POA: Diagnosis not present

## 2023-02-18 DIAGNOSIS — I890 Lymphedema, not elsewhere classified: Secondary | ICD-10-CM | POA: Diagnosis not present

## 2023-02-18 DIAGNOSIS — I110 Hypertensive heart disease with heart failure: Secondary | ICD-10-CM | POA: Diagnosis not present

## 2023-02-18 DIAGNOSIS — I872 Venous insufficiency (chronic) (peripheral): Secondary | ICD-10-CM | POA: Diagnosis not present

## 2023-02-18 DIAGNOSIS — E1151 Type 2 diabetes mellitus with diabetic peripheral angiopathy without gangrene: Secondary | ICD-10-CM | POA: Diagnosis not present

## 2023-02-18 DIAGNOSIS — Z4800 Encounter for change or removal of nonsurgical wound dressing: Secondary | ICD-10-CM | POA: Diagnosis not present

## 2023-02-20 DIAGNOSIS — M25512 Pain in left shoulder: Secondary | ICD-10-CM | POA: Diagnosis not present

## 2023-02-22 DIAGNOSIS — I5032 Chronic diastolic (congestive) heart failure: Secondary | ICD-10-CM | POA: Diagnosis not present

## 2023-02-22 DIAGNOSIS — E7800 Pure hypercholesterolemia, unspecified: Secondary | ICD-10-CM | POA: Diagnosis not present

## 2023-02-22 DIAGNOSIS — L97811 Non-pressure chronic ulcer of other part of right lower leg limited to breakdown of skin: Secondary | ICD-10-CM | POA: Diagnosis not present

## 2023-02-22 DIAGNOSIS — R7309 Other abnormal glucose: Secondary | ICD-10-CM | POA: Diagnosis not present

## 2023-02-22 DIAGNOSIS — E089 Diabetes mellitus due to underlying condition without complications: Secondary | ICD-10-CM | POA: Diagnosis not present

## 2023-02-22 DIAGNOSIS — I509 Heart failure, unspecified: Secondary | ICD-10-CM | POA: Diagnosis not present

## 2023-02-22 DIAGNOSIS — I5020 Unspecified systolic (congestive) heart failure: Secondary | ICD-10-CM | POA: Diagnosis not present

## 2023-02-22 DIAGNOSIS — I110 Hypertensive heart disease with heart failure: Secondary | ICD-10-CM | POA: Diagnosis not present

## 2023-02-22 DIAGNOSIS — Z4800 Encounter for change or removal of nonsurgical wound dressing: Secondary | ICD-10-CM | POA: Diagnosis not present

## 2023-02-22 DIAGNOSIS — L97821 Non-pressure chronic ulcer of other part of left lower leg limited to breakdown of skin: Secondary | ICD-10-CM | POA: Diagnosis not present

## 2023-02-22 DIAGNOSIS — E1151 Type 2 diabetes mellitus with diabetic peripheral angiopathy without gangrene: Secondary | ICD-10-CM | POA: Diagnosis not present

## 2023-02-22 DIAGNOSIS — Z5181 Encounter for therapeutic drug level monitoring: Secondary | ICD-10-CM | POA: Diagnosis not present

## 2023-02-22 DIAGNOSIS — I890 Lymphedema, not elsewhere classified: Secondary | ICD-10-CM | POA: Diagnosis not present

## 2023-02-22 DIAGNOSIS — I872 Venous insufficiency (chronic) (peripheral): Secondary | ICD-10-CM | POA: Diagnosis not present

## 2023-02-25 DIAGNOSIS — L97811 Non-pressure chronic ulcer of other part of right lower leg limited to breakdown of skin: Secondary | ICD-10-CM | POA: Diagnosis not present

## 2023-02-25 DIAGNOSIS — I890 Lymphedema, not elsewhere classified: Secondary | ICD-10-CM | POA: Diagnosis not present

## 2023-02-25 DIAGNOSIS — L97821 Non-pressure chronic ulcer of other part of left lower leg limited to breakdown of skin: Secondary | ICD-10-CM | POA: Diagnosis not present

## 2023-02-25 DIAGNOSIS — I5020 Unspecified systolic (congestive) heart failure: Secondary | ICD-10-CM | POA: Diagnosis not present

## 2023-02-25 DIAGNOSIS — E1151 Type 2 diabetes mellitus with diabetic peripheral angiopathy without gangrene: Secondary | ICD-10-CM | POA: Diagnosis not present

## 2023-02-25 DIAGNOSIS — I5032 Chronic diastolic (congestive) heart failure: Secondary | ICD-10-CM | POA: Diagnosis not present

## 2023-02-25 DIAGNOSIS — I110 Hypertensive heart disease with heart failure: Secondary | ICD-10-CM | POA: Diagnosis not present

## 2023-02-25 DIAGNOSIS — Z4800 Encounter for change or removal of nonsurgical wound dressing: Secondary | ICD-10-CM | POA: Diagnosis not present

## 2023-02-25 DIAGNOSIS — I872 Venous insufficiency (chronic) (peripheral): Secondary | ICD-10-CM | POA: Diagnosis not present

## 2023-02-26 DIAGNOSIS — E1151 Type 2 diabetes mellitus with diabetic peripheral angiopathy without gangrene: Secondary | ICD-10-CM | POA: Diagnosis not present

## 2023-02-26 DIAGNOSIS — L97811 Non-pressure chronic ulcer of other part of right lower leg limited to breakdown of skin: Secondary | ICD-10-CM | POA: Diagnosis not present

## 2023-02-26 DIAGNOSIS — Z4800 Encounter for change or removal of nonsurgical wound dressing: Secondary | ICD-10-CM | POA: Diagnosis not present

## 2023-02-26 DIAGNOSIS — I872 Venous insufficiency (chronic) (peripheral): Secondary | ICD-10-CM | POA: Diagnosis not present

## 2023-02-26 DIAGNOSIS — I5020 Unspecified systolic (congestive) heart failure: Secondary | ICD-10-CM | POA: Diagnosis not present

## 2023-02-26 DIAGNOSIS — I110 Hypertensive heart disease with heart failure: Secondary | ICD-10-CM | POA: Diagnosis not present

## 2023-02-26 DIAGNOSIS — L97821 Non-pressure chronic ulcer of other part of left lower leg limited to breakdown of skin: Secondary | ICD-10-CM | POA: Diagnosis not present

## 2023-02-26 DIAGNOSIS — I890 Lymphedema, not elsewhere classified: Secondary | ICD-10-CM | POA: Diagnosis not present

## 2023-02-26 DIAGNOSIS — I5032 Chronic diastolic (congestive) heart failure: Secondary | ICD-10-CM | POA: Diagnosis not present

## 2023-03-01 DIAGNOSIS — I890 Lymphedema, not elsewhere classified: Secondary | ICD-10-CM | POA: Diagnosis not present

## 2023-03-01 DIAGNOSIS — R32 Unspecified urinary incontinence: Secondary | ICD-10-CM | POA: Diagnosis not present

## 2023-03-01 DIAGNOSIS — I5032 Chronic diastolic (congestive) heart failure: Secondary | ICD-10-CM | POA: Diagnosis not present

## 2023-03-01 DIAGNOSIS — I872 Venous insufficiency (chronic) (peripheral): Secondary | ICD-10-CM | POA: Diagnosis not present

## 2023-03-01 DIAGNOSIS — E1151 Type 2 diabetes mellitus with diabetic peripheral angiopathy without gangrene: Secondary | ICD-10-CM | POA: Diagnosis not present

## 2023-03-01 DIAGNOSIS — Z4800 Encounter for change or removal of nonsurgical wound dressing: Secondary | ICD-10-CM | POA: Diagnosis not present

## 2023-03-01 DIAGNOSIS — I110 Hypertensive heart disease with heart failure: Secondary | ICD-10-CM | POA: Diagnosis not present

## 2023-03-01 DIAGNOSIS — L97811 Non-pressure chronic ulcer of other part of right lower leg limited to breakdown of skin: Secondary | ICD-10-CM | POA: Diagnosis not present

## 2023-03-01 DIAGNOSIS — L97821 Non-pressure chronic ulcer of other part of left lower leg limited to breakdown of skin: Secondary | ICD-10-CM | POA: Diagnosis not present

## 2023-03-01 DIAGNOSIS — I5020 Unspecified systolic (congestive) heart failure: Secondary | ICD-10-CM | POA: Diagnosis not present

## 2023-03-02 ENCOUNTER — Other Ambulatory Visit (HOSPITAL_COMMUNITY): Payer: Self-pay | Admitting: Emergency Medicine

## 2023-03-02 DIAGNOSIS — E1151 Type 2 diabetes mellitus with diabetic peripheral angiopathy without gangrene: Secondary | ICD-10-CM | POA: Diagnosis not present

## 2023-03-02 DIAGNOSIS — I890 Lymphedema, not elsewhere classified: Secondary | ICD-10-CM | POA: Diagnosis not present

## 2023-03-02 DIAGNOSIS — I5020 Unspecified systolic (congestive) heart failure: Secondary | ICD-10-CM | POA: Diagnosis not present

## 2023-03-02 DIAGNOSIS — I872 Venous insufficiency (chronic) (peripheral): Secondary | ICD-10-CM | POA: Diagnosis not present

## 2023-03-02 DIAGNOSIS — Z4800 Encounter for change or removal of nonsurgical wound dressing: Secondary | ICD-10-CM | POA: Diagnosis not present

## 2023-03-02 DIAGNOSIS — I5032 Chronic diastolic (congestive) heart failure: Secondary | ICD-10-CM | POA: Diagnosis not present

## 2023-03-02 DIAGNOSIS — L97811 Non-pressure chronic ulcer of other part of right lower leg limited to breakdown of skin: Secondary | ICD-10-CM | POA: Diagnosis not present

## 2023-03-02 DIAGNOSIS — L97821 Non-pressure chronic ulcer of other part of left lower leg limited to breakdown of skin: Secondary | ICD-10-CM | POA: Diagnosis not present

## 2023-03-02 DIAGNOSIS — I110 Hypertensive heart disease with heart failure: Secondary | ICD-10-CM | POA: Diagnosis not present

## 2023-03-02 NOTE — Progress Notes (Signed)
Paramedicine Encounter    Patient ID: Charles Gray, male    DOB: [birth year], 67 y.o.   MRN: [id number]   Complaints - acute right flank pain x 2 days, continued left shoulder pain.    Assessment - bent over walking, lung sounds clear, +2 edema with [place]oot wraps  Compliance with meds - no missed medication doses noted.   Pill box filled - for 2x weeks   Refills needed - gabapentin, torsemide, metolazone, farxiga, eplerenone  Meds changes since last visit - none    Social changes - continued depression for pt with sons prognosis - refusing any mental health/grieving. Started PT, Seeing CSW, nurse who wraps his legs, and home health aid [name]).    VISIT SUMMARY** I met with Charles Gray in the home. Charles Gray reported that he has had an increase in in-home care since seeing Dr. Egbert Garibaldi. Pt reports continued decreased mobility due to swelling in his legs and increasing pain in his left shoulder. Pt currently walking around holding support rails of walker due to shoulder pain and not able to stand upright at this time. Pt advised that he did get his x ray taken for Dr. Egbert Garibaldi, but couldn't remember the diagnosis or prognosis. Pt reported continued frustration with Metolazone, advising that it makes him feel nauseated, dehydrated, ill-feeling, and hypokalemic. Pt reports that the Marcelline Deist also makes him feel nauseated and increases his joint pain. Pt has been advised to follow up with PCP for these symptoms, however has been non-compliant with same. Pt was explained the purpose of both of these medications, but pt persisted with complaints. Will continue to monitor symptoms and will follow up if symptoms worsen. We spoke today about the prognosis of his sons diagnosis of Huntington's disease and pt was visibly upset. Pt was recommended to have grief counseling so than he can be of better support to his son as his disease process progresses. Pt was adamant that he was uninterested, but will continue to  follow up as this is really affecting him emotionally. Pt also spent an extended amount of time asking questions about his disease process and his prognosis. I explained to the best of my ability the process of heart failure, but advised that we bring this up during his follow up in January. Pt was agreeable to same. Pt went to weigh this evening when he bent over in pain to his upper right flank. Pt reported that the pain has been bothering him for 2x days. Pt reports swelling but did not want it to be palpated due to pain. Pt was urged to follow up with primary and pt advised that he did not want to bother his PCP with every little ache and pain. Will follow up on progress on next visit and continue to encourage evaluation. Pt's pill box was filled for 2x weeks. Will follow up in 2x weeks.   BP (!) 142/70   Pulse 88   Resp 16   Wt (!) 308 lb (139.7 kg)   SpO2 96%   BMI 32.21 kg/m  Weight yesterday-310 lbs  Last visit weight-307 lbs   [place] Setting EMT-P Community Paramedic  [phone]     ACTION: Home visit completed     Patient Care Team: Nonnie Done., MD as PCP - General (Family Medicine) Laurey Morale, MD as PCP - Cardiology (Cardiology) [name], LCSW as Social Worker (Licensed Clinical Social Worker)  Patient Active Problem List   Diagnosis Date Noted   Acute on chronic heart  failure with preserved ejection fraction (HFpEF) (HCC) 03/04/2022   Dyslipidemia 07/17/2021   Lymphedema 07/08/2021   Melena    Gastric polyps    UGIB (upper gastrointestinal bleed) 04/06/2021   Acute encephalopathy 12/21/2020   COPD (chronic obstructive pulmonary disease) (HCC) 12/21/2020   Chronic diastolic (congestive) heart failure (HCC) 11/14/2020   Syncope 10/28/2020   CHF (congestive heart failure) (HCC) 10/28/2020   Chronic venous stasis dermatitis of both lower extremities 08/27/2020   Cellulitis of both lower extremities 08/14/2020   Iron deficiency anemia 08/14/2020    GERD without esophagitis 06/10/2020   Atrial fibrillation with rapid ventricular response (HCC) 03/24/2020   Acute on chronic right heart failure (HCC) 01/27/2020   Acute respiratory failure with hypoxia (HCC)    Fatty liver disease, nonalcoholic    Shock (HCC)    Personal history of DVT (deep vein thrombosis) 12/26/2019   Chronic anticoagulation 12/26/2019   Palpitations 12/26/2019   Paroxysmal atrial fibrillation (HCC) 12/23/2019   Heart failure, systolic, acute (HCC) 08/24/2019   Acute on chronic congestive heart failure (HCC)    Acute on chronic diastolic CHF (congestive heart failure) (HCC) 08/23/2019   Chest pain 08/23/2019   Lactic acidosis 08/23/2019   History of pulmonary embolism 08/12/2019   Atrial tachycardia (HCC) 08/12/2019   Occasional tremors 08/12/2019   OSA (obstructive sleep apnea) 08/12/2019   AKI (acute kidney injury) (HCC) 08/08/2019   Acute on chronic right-sided heart failure (HCC) 08/06/2019   Acute pulmonary embolism (HCC) 06/08/2019   Normocytic anemia 06/08/2019   Pulmonary emboli (HCC) 06/08/2019   Chronic heart failure with preserved ejection fraction (HCC) 05/30/2019   Class 2 severe obesity due to excess calories with serious comorbidity and body mass index (BMI) of 39.0 to 39.9 in adult (HCC) 05/30/2019   Morbid obesity with BMI of 40.0-44.9, adult (HCC) 05/30/2019   Hyponatremia    Drug induced constipation    Peripheral edema    Hypotension due to drugs    Hypoalbuminemia due to protein-calorie malnutrition (HCC)    Thrombocytopenia (HCC)    Anemia of chronic disease    Cirrhosis of liver without ascites (HCC)    Chronic pain syndrome    Debility 03/13/2019   Portal hypertensive gastropathy (HCC) 03/06/2019   Dyspnea    GIB (gastrointestinal bleeding) 03/04/2019   Mixed hyperlipidemia 01/24/2019   Insomnia 01/24/2019   Hyperlipidemia 01/24/2019   Essential hypertension 01/24/2019   Lumbar disc herniation 01/24/2019    Current  Outpatient Medications:    apixaban (ELIQUIS) 5 MG TABS tablet, Take 1 tablet (5 mg total) by mouth 2 (two) times daily., Disp: 60 tablet, Rfl: 3   Buprenorphine HCl-Naloxone HCl 8-2 MG FILM, Place 1 Film under the tongue in the morning and at bedtime., Disp: , Rfl:    eplerenone (INSPRA) 50 MG tablet, Take 2 tablets (100 mg total) by mouth daily., Disp: 60 tablet, Rfl: 11   FARXIGA 10 MG TABS tablet, Take 1 tablet (10 mg total) by mouth daily before breakfast., Disp: 30 tablet, Rfl: 11   gabapentin (NEURONTIN) 800 MG tablet, Take 1 tablet (800 mg total) by mouth 3 (three) times daily., Disp: 90 tablet, Rfl: 1   lactulose, encephalopathy, (CHRONULAC) 10 GM/15ML SOLN, Take 30 mLs (20 g total) by mouth 2 (two) times daily., Disp: , Rfl:    metolazone (ZAROXOLYN) 2.5 MG tablet, Take 2.5 mg by mouth 3 (three) times a week., Disp: , Rfl:    metoprolol succinate (TOPROL XL) 25 MG 24 hr tablet, Take  1 tablet (25 mg total) by mouth at bedtime., Disp: 30 tablet, Rfl: 11   ondansetron (ZOFRAN-ODT) 4 MG disintegrating tablet, Take 4 mg by mouth every 8 (eight) hours as needed for nausea or vomiting (dissolve orally)., Disp: , Rfl:    pantoprazole (PROTONIX) 40 MG tablet, Take 40 mg by mouth 2 (two) times daily before a meal., Disp: , Rfl:    polyethylene glycol (MIRALAX / GLYCOLAX) 17 g packet, Take 17 g by mouth as needed for mild constipation (mix and drink as directed)., Disp: , Rfl:    potassium chloride (KLOR-CON) 20 MEQ packet, Take 100 mEq by mouth 3 (three) times daily. Take an extra 40 mEq on days when you take metolazone, Disp: 475 packet, Rfl: 3   pravastatin (PRAVACHOL) 20 MG tablet, Take 1 tablet (20 mg total) by mouth daily., Disp: 30 tablet, Rfl: 11   sertraline (ZOLOFT) 100 MG tablet, Take 100 mg by mouth daily., Disp: , Rfl:    tamsulosin (FLOMAX) 0.4 MG CAPS capsule, Take 1 capsule (0.4 mg total) by mouth daily. Additional refills will need to come from PCP, Disp: 30 capsule, Rfl: 0    torsemide (DEMADEX) 100 MG tablet, TAKE ONE TABLET BY MOUTH DAILY IN THE MORNING AND EVENING, Disp: 90 tablet, Rfl: 3   triamcinolone (KENALOG) 0.025 % ointment, Apply 1 application. topically 2 (two) times daily., Disp: 30 g, Rfl: 0   VENTOLIN HFA 108 (90 Base) MCG/ACT inhaler, Inhale 1 puff into the lungs every 4 (four) hours as needed for shortness of breath. (Patient taking differently: Inhale 2 puffs into the lungs every 4 (four) hours as needed for shortness of breath.), Disp: 6.7 g, Rfl: 1   zolpidem (AMBIEN) 10 MG tablet, Take 1 tablet (10 mg total) by mouth at bedtime as needed for sleep., Disp: 30 tablet, Rfl: 0 Allergies  Allergen Reactions   Penicillins Other (See Comments)    Caused convulsions as a child  Did it involve swelling of the face/tongue/throat, SOB, or low BP? N/A Did it involve sudden or severe rash/hives, skin peeling, or any reaction on the inside of your mouth or nose? N/A Did you need to seek medical attention at a hospital or doctor's office?N/A When did it last happen? N/A If all above answers are "NO", may proceed with cephalosporin use.     Social History   Socioeconomic History   Marital status: Single    Spouse name: Not on file   Number of children: Not on file   Years of education: Not on file   Highest education level: Not on file  Occupational History   Not on file  Tobacco Use   Smoking status: Never   Smokeless tobacco: Never  Vaping Use   Vaping status: Never Used  Substance and Sexual Activity   Alcohol use: Not Currently   Drug use: Not Currently    Types: Marijuana    Comment: Smoked for 30 years   Sexual activity: Not on file  Other Topics Concern   Not on file  Social History Narrative   Not on file   Social Determinants of Health   Financial Resource Strain: Medium Risk (11/15/2020)   Overall Financial Resource Strain (CARDIA)    Difficulty of Paying Living Expenses: Somewhat hard  Food Insecurity: No Food Insecurity  (03/08/2022)   Hunger Vital Sign    Worried About Running Out of Food in the Last Year: Never true    Ran Out of Food in the Last Year: Never  true  Transportation Needs: Unmet Transportation Needs (04/21/2022)   PRAPARE - Administrator, Civil Service (Medical): Yes    Lack of Transportation (Non-Medical): Yes  Physical Activity: Not on file  Stress: Not on file  Social Connections: Not on file  Intimate Partner Violence: Not At Risk (03/08/2022)   Humiliation, Afraid, Rape, and Kick questionnaire    Fear of Current or Ex-Partner: No    Emotionally Abused: No    Physically Abused: No    Sexually Abused: No    Physical Exam      Future Appointments  Date Time Provider Department Center  05/04/2023  1:30 PM [facility] PA/NP [facility] None

## 2023-03-03 DIAGNOSIS — F1120 Opioid dependence, uncomplicated: Secondary | ICD-10-CM | POA: Diagnosis not present

## 2023-03-03 DIAGNOSIS — I872 Venous insufficiency (chronic) (peripheral): Secondary | ICD-10-CM | POA: Diagnosis not present

## 2023-03-03 DIAGNOSIS — I5020 Unspecified systolic (congestive) heart failure: Secondary | ICD-10-CM | POA: Diagnosis not present

## 2023-03-03 DIAGNOSIS — L97811 Non-pressure chronic ulcer of other part of right lower leg limited to breakdown of skin: Secondary | ICD-10-CM | POA: Diagnosis not present

## 2023-03-03 DIAGNOSIS — E1151 Type 2 diabetes mellitus with diabetic peripheral angiopathy without gangrene: Secondary | ICD-10-CM | POA: Diagnosis not present

## 2023-03-03 DIAGNOSIS — Z6834 Body mass index (BMI) 34.0-34.9, adult: Secondary | ICD-10-CM | POA: Diagnosis not present

## 2023-03-03 DIAGNOSIS — L97821 Non-pressure chronic ulcer of other part of left lower leg limited to breakdown of skin: Secondary | ICD-10-CM | POA: Diagnosis not present

## 2023-03-03 DIAGNOSIS — F332 Major depressive disorder, recurrent severe without psychotic features: Secondary | ICD-10-CM | POA: Diagnosis not present

## 2023-03-03 DIAGNOSIS — I5032 Chronic diastolic (congestive) heart failure: Secondary | ICD-10-CM | POA: Diagnosis not present

## 2023-03-03 DIAGNOSIS — I890 Lymphedema, not elsewhere classified: Secondary | ICD-10-CM | POA: Diagnosis not present

## 2023-03-03 DIAGNOSIS — Z4800 Encounter for change or removal of nonsurgical wound dressing: Secondary | ICD-10-CM | POA: Diagnosis not present

## 2023-03-03 DIAGNOSIS — F411 Generalized anxiety disorder: Secondary | ICD-10-CM | POA: Diagnosis not present

## 2023-03-03 DIAGNOSIS — I110 Hypertensive heart disease with heart failure: Secondary | ICD-10-CM | POA: Diagnosis not present

## 2023-03-04 ENCOUNTER — Telehealth (HOSPITAL_COMMUNITY): Payer: Self-pay | Admitting: Emergency Medicine

## 2023-03-04 DIAGNOSIS — I872 Venous insufficiency (chronic) (peripheral): Secondary | ICD-10-CM | POA: Diagnosis not present

## 2023-03-04 DIAGNOSIS — I5020 Unspecified systolic (congestive) heart failure: Secondary | ICD-10-CM | POA: Diagnosis not present

## 2023-03-04 DIAGNOSIS — L97811 Non-pressure chronic ulcer of other part of right lower leg limited to breakdown of skin: Secondary | ICD-10-CM | POA: Diagnosis not present

## 2023-03-04 DIAGNOSIS — I110 Hypertensive heart disease with heart failure: Secondary | ICD-10-CM | POA: Diagnosis not present

## 2023-03-04 DIAGNOSIS — L97821 Non-pressure chronic ulcer of other part of left lower leg limited to breakdown of skin: Secondary | ICD-10-CM | POA: Diagnosis not present

## 2023-03-04 DIAGNOSIS — Z4800 Encounter for change or removal of nonsurgical wound dressing: Secondary | ICD-10-CM | POA: Diagnosis not present

## 2023-03-04 DIAGNOSIS — E1151 Type 2 diabetes mellitus with diabetic peripheral angiopathy without gangrene: Secondary | ICD-10-CM | POA: Diagnosis not present

## 2023-03-04 DIAGNOSIS — I5032 Chronic diastolic (congestive) heart failure: Secondary | ICD-10-CM | POA: Diagnosis not present

## 2023-03-04 DIAGNOSIS — I890 Lymphedema, not elsewhere classified: Secondary | ICD-10-CM | POA: Diagnosis not present

## 2023-03-04 NOTE — Telephone Encounter (Signed)
I received a text from [facility], [facility], stating that, "Dr. Orvan Falconer put him on another antidepressant called Vraylar 1.5 mg".   Will inquire more at our next home visit.   [place] Setting EMT-P Community Paramedic  [phone]

## 2023-03-05 DIAGNOSIS — I890 Lymphedema, not elsewhere classified: Secondary | ICD-10-CM | POA: Diagnosis not present

## 2023-03-05 DIAGNOSIS — Z4800 Encounter for change or removal of nonsurgical wound dressing: Secondary | ICD-10-CM | POA: Diagnosis not present

## 2023-03-05 DIAGNOSIS — E1151 Type 2 diabetes mellitus with diabetic peripheral angiopathy without gangrene: Secondary | ICD-10-CM | POA: Diagnosis not present

## 2023-03-05 DIAGNOSIS — L97821 Non-pressure chronic ulcer of other part of left lower leg limited to breakdown of skin: Secondary | ICD-10-CM | POA: Diagnosis not present

## 2023-03-05 DIAGNOSIS — I872 Venous insufficiency (chronic) (peripheral): Secondary | ICD-10-CM | POA: Diagnosis not present

## 2023-03-05 DIAGNOSIS — I110 Hypertensive heart disease with heart failure: Secondary | ICD-10-CM | POA: Diagnosis not present

## 2023-03-05 DIAGNOSIS — L97811 Non-pressure chronic ulcer of other part of right lower leg limited to breakdown of skin: Secondary | ICD-10-CM | POA: Diagnosis not present

## 2023-03-05 DIAGNOSIS — I5020 Unspecified systolic (congestive) heart failure: Secondary | ICD-10-CM | POA: Diagnosis not present

## 2023-03-05 DIAGNOSIS — I5032 Chronic diastolic (congestive) heart failure: Secondary | ICD-10-CM | POA: Diagnosis not present

## 2023-03-08 DIAGNOSIS — I890 Lymphedema, not elsewhere classified: Secondary | ICD-10-CM | POA: Diagnosis not present

## 2023-03-08 DIAGNOSIS — I5032 Chronic diastolic (congestive) heart failure: Secondary | ICD-10-CM | POA: Diagnosis not present

## 2023-03-08 DIAGNOSIS — I5020 Unspecified systolic (congestive) heart failure: Secondary | ICD-10-CM | POA: Diagnosis not present

## 2023-03-08 DIAGNOSIS — I872 Venous insufficiency (chronic) (peripheral): Secondary | ICD-10-CM | POA: Diagnosis not present

## 2023-03-08 DIAGNOSIS — L97821 Non-pressure chronic ulcer of other part of left lower leg limited to breakdown of skin: Secondary | ICD-10-CM | POA: Diagnosis not present

## 2023-03-08 DIAGNOSIS — I110 Hypertensive heart disease with heart failure: Secondary | ICD-10-CM | POA: Diagnosis not present

## 2023-03-08 DIAGNOSIS — E1151 Type 2 diabetes mellitus with diabetic peripheral angiopathy without gangrene: Secondary | ICD-10-CM | POA: Diagnosis not present

## 2023-03-08 DIAGNOSIS — L97811 Non-pressure chronic ulcer of other part of right lower leg limited to breakdown of skin: Secondary | ICD-10-CM | POA: Diagnosis not present

## 2023-03-08 DIAGNOSIS — Z4800 Encounter for change or removal of nonsurgical wound dressing: Secondary | ICD-10-CM | POA: Diagnosis not present

## 2023-03-09 DIAGNOSIS — I872 Venous insufficiency (chronic) (peripheral): Secondary | ICD-10-CM | POA: Diagnosis not present

## 2023-03-09 DIAGNOSIS — I5032 Chronic diastolic (congestive) heart failure: Secondary | ICD-10-CM | POA: Diagnosis not present

## 2023-03-09 DIAGNOSIS — I890 Lymphedema, not elsewhere classified: Secondary | ICD-10-CM | POA: Diagnosis not present

## 2023-03-09 DIAGNOSIS — I5020 Unspecified systolic (congestive) heart failure: Secondary | ICD-10-CM | POA: Diagnosis not present

## 2023-03-09 DIAGNOSIS — L97821 Non-pressure chronic ulcer of other part of left lower leg limited to breakdown of skin: Secondary | ICD-10-CM | POA: Diagnosis not present

## 2023-03-09 DIAGNOSIS — E1151 Type 2 diabetes mellitus with diabetic peripheral angiopathy without gangrene: Secondary | ICD-10-CM | POA: Diagnosis not present

## 2023-03-09 DIAGNOSIS — I110 Hypertensive heart disease with heart failure: Secondary | ICD-10-CM | POA: Diagnosis not present

## 2023-03-09 DIAGNOSIS — Z4800 Encounter for change or removal of nonsurgical wound dressing: Secondary | ICD-10-CM | POA: Diagnosis not present

## 2023-03-09 DIAGNOSIS — L97811 Non-pressure chronic ulcer of other part of right lower leg limited to breakdown of skin: Secondary | ICD-10-CM | POA: Diagnosis not present

## 2023-03-10 DIAGNOSIS — I5032 Chronic diastolic (congestive) heart failure: Secondary | ICD-10-CM | POA: Diagnosis not present

## 2023-03-10 DIAGNOSIS — I872 Venous insufficiency (chronic) (peripheral): Secondary | ICD-10-CM | POA: Diagnosis not present

## 2023-03-10 DIAGNOSIS — L97821 Non-pressure chronic ulcer of other part of left lower leg limited to breakdown of skin: Secondary | ICD-10-CM | POA: Diagnosis not present

## 2023-03-10 DIAGNOSIS — I110 Hypertensive heart disease with heart failure: Secondary | ICD-10-CM | POA: Diagnosis not present

## 2023-03-10 DIAGNOSIS — I890 Lymphedema, not elsewhere classified: Secondary | ICD-10-CM | POA: Diagnosis not present

## 2023-03-10 DIAGNOSIS — L97811 Non-pressure chronic ulcer of other part of right lower leg limited to breakdown of skin: Secondary | ICD-10-CM | POA: Diagnosis not present

## 2023-03-10 DIAGNOSIS — E1151 Type 2 diabetes mellitus with diabetic peripheral angiopathy without gangrene: Secondary | ICD-10-CM | POA: Diagnosis not present

## 2023-03-10 DIAGNOSIS — I5020 Unspecified systolic (congestive) heart failure: Secondary | ICD-10-CM | POA: Diagnosis not present

## 2023-03-10 DIAGNOSIS — Z4800 Encounter for change or removal of nonsurgical wound dressing: Secondary | ICD-10-CM | POA: Diagnosis not present

## 2023-03-12 DIAGNOSIS — Z4800 Encounter for change or removal of nonsurgical wound dressing: Secondary | ICD-10-CM | POA: Diagnosis not present

## 2023-03-12 DIAGNOSIS — L97811 Non-pressure chronic ulcer of other part of right lower leg limited to breakdown of skin: Secondary | ICD-10-CM | POA: Diagnosis not present

## 2023-03-12 DIAGNOSIS — I872 Venous insufficiency (chronic) (peripheral): Secondary | ICD-10-CM | POA: Diagnosis not present

## 2023-03-12 DIAGNOSIS — E1151 Type 2 diabetes mellitus with diabetic peripheral angiopathy without gangrene: Secondary | ICD-10-CM | POA: Diagnosis not present

## 2023-03-12 DIAGNOSIS — I5020 Unspecified systolic (congestive) heart failure: Secondary | ICD-10-CM | POA: Diagnosis not present

## 2023-03-12 DIAGNOSIS — L97821 Non-pressure chronic ulcer of other part of left lower leg limited to breakdown of skin: Secondary | ICD-10-CM | POA: Diagnosis not present

## 2023-03-12 DIAGNOSIS — I5032 Chronic diastolic (congestive) heart failure: Secondary | ICD-10-CM | POA: Diagnosis not present

## 2023-03-12 DIAGNOSIS — I110 Hypertensive heart disease with heart failure: Secondary | ICD-10-CM | POA: Diagnosis not present

## 2023-03-12 DIAGNOSIS — I890 Lymphedema, not elsewhere classified: Secondary | ICD-10-CM | POA: Diagnosis not present

## 2023-03-15 DIAGNOSIS — L97811 Non-pressure chronic ulcer of other part of right lower leg limited to breakdown of skin: Secondary | ICD-10-CM | POA: Diagnosis not present

## 2023-03-15 DIAGNOSIS — E1151 Type 2 diabetes mellitus with diabetic peripheral angiopathy without gangrene: Secondary | ICD-10-CM | POA: Diagnosis not present

## 2023-03-15 DIAGNOSIS — I5032 Chronic diastolic (congestive) heart failure: Secondary | ICD-10-CM | POA: Diagnosis not present

## 2023-03-15 DIAGNOSIS — I872 Venous insufficiency (chronic) (peripheral): Secondary | ICD-10-CM | POA: Diagnosis not present

## 2023-03-15 DIAGNOSIS — L97821 Non-pressure chronic ulcer of other part of left lower leg limited to breakdown of skin: Secondary | ICD-10-CM | POA: Diagnosis not present

## 2023-03-15 DIAGNOSIS — I890 Lymphedema, not elsewhere classified: Secondary | ICD-10-CM | POA: Diagnosis not present

## 2023-03-15 DIAGNOSIS — I5020 Unspecified systolic (congestive) heart failure: Secondary | ICD-10-CM | POA: Diagnosis not present

## 2023-03-15 DIAGNOSIS — Z4800 Encounter for change or removal of nonsurgical wound dressing: Secondary | ICD-10-CM | POA: Diagnosis not present

## 2023-03-15 DIAGNOSIS — I110 Hypertensive heart disease with heart failure: Secondary | ICD-10-CM | POA: Diagnosis not present

## 2023-03-16 ENCOUNTER — Other Ambulatory Visit (HOSPITAL_COMMUNITY): Payer: Self-pay | Admitting: Emergency Medicine

## 2023-03-16 NOTE — Progress Notes (Unsigned)
Paramedicine Encounter    Patient ID: Charles Gray, male    DOB: 04-Mar-1956, 67 y.o.   MRN: [id number]   Complaints - Continued left shoulder pain and knee pain. -Continued swelling in lower extremities-actively being treated by home health and followed by PCP -Continued incontinence, but "retraining" with PT.   Assessment - red, warm, swollen legs, lung sounds clear.   Compliance with meds - no missed doses noted.   Pill box filled - for 2x weeks.   Refills needed - gabapentin, vraylar, metolazone, metoprolol, farxiga.   Meds changes since last visit - placed on Vraylar 1.5 mg by Dr. Orvan Falconer.    Social changes - PT/OT in the home.    VISIT SUMMARY**  I met with Charles Gray today in the home. Pt appeared to be doing well, he had an improved complexion and posture to our last visit. Pt reports he has home health, PT, and OT in the home working with him. He seems pleased with his treatment and is noticing progress on his own as well. Pt denied any HF related symptoms at this time. His pill box was filled for 2x week, will follow up in 2x weeks.   BP 138/88   Pulse 80   Resp 16   Wt 300 lb (136.1 kg)   SpO2 97%   BMI 31.37 kg/m  Weight yesterday-308 lbs Last visit weight- 308 lbs CardioMems: 6, goal 9  Benson Setting EMT-P Community Paramedic  [phone]    ACTION: Home visit completed     Patient Care Team: Nonnie Done., MD as PCP - General (Family Medicine) Laurey Morale, MD as PCP - Cardiology (Cardiology) [name], LCSW as Social Worker (Licensed Clinical Social Worker)  Patient Active Problem List   Diagnosis Date Noted   Acute on chronic heart failure with preserved ejection fraction (HFpEF) (HCC) 03/04/2022   Dyslipidemia 07/17/2021   Lymphedema 07/08/2021   Melena    Gastric polyps    UGIB (upper gastrointestinal bleed) 04/06/2021   Acute encephalopathy 12/21/2020   COPD (chronic obstructive pulmonary disease) (HCC) 12/21/2020   Chronic  diastolic (congestive) heart failure (HCC) 11/14/2020   Syncope 10/28/2020   CHF (congestive heart failure) (HCC) 10/28/2020   Chronic venous stasis dermatitis of both lower extremities 08/27/2020   Cellulitis of both lower extremities 08/14/2020   Iron deficiency anemia 08/14/2020   GERD without esophagitis 06/10/2020   Atrial fibrillation with rapid ventricular response (HCC) 03/24/2020   Acute on chronic right heart failure (HCC) 01/27/2020   Acute respiratory failure with hypoxia (HCC)    Fatty liver disease, nonalcoholic    Shock (HCC)    Personal history of DVT (deep vein thrombosis) 12/26/2019   Chronic anticoagulation 12/26/2019   Palpitations 12/26/2019   Paroxysmal atrial fibrillation (HCC) 12/23/2019   Heart failure, systolic, acute (HCC) 08/24/2019   Acute on chronic congestive heart failure (HCC)    Acute on chronic diastolic CHF (congestive heart failure) (HCC) 08/23/2019   Chest pain 08/23/2019   Lactic acidosis 08/23/2019   History of pulmonary embolism 08/12/2019   Atrial tachycardia (HCC) 08/12/2019   Occasional tremors 08/12/2019   OSA (obstructive sleep apnea) 08/12/2019   AKI (acute kidney injury) (HCC) 08/08/2019   Acute on chronic right-sided heart failure (HCC) 08/06/2019   Acute pulmonary embolism (HCC) 06/08/2019   Normocytic anemia 06/08/2019   Pulmonary emboli (HCC) 06/08/2019   Chronic heart failure with preserved ejection fraction (HCC) 05/30/2019   Class 2 severe obesity due to excess calories with  serious comorbidity and body mass index (BMI) of 39.0 to 39.9 in adult [facility]) 05/30/2019   Morbid obesity with BMI of 40.0-44.9, adult (HCC) 05/30/2019   Hyponatremia    Drug induced constipation    Peripheral edema    Hypotension due to drugs    Hypoalbuminemia due to protein-calorie malnutrition (HCC)    Thrombocytopenia (HCC)    Anemia of chronic disease    Cirrhosis of liver without ascites (HCC)    Chronic pain syndrome    Debility 03/13/2019    Portal hypertensive gastropathy (HCC) 03/06/2019   Dyspnea    GIB (gastrointestinal bleeding) 03/04/2019   Mixed hyperlipidemia 01/24/2019   Insomnia 01/24/2019   Hyperlipidemia 01/24/2019   Essential hypertension 01/24/2019   Lumbar disc herniation 01/24/2019    Current Outpatient Medications:    apixaban (ELIQUIS) 5 MG TABS tablet, Take 1 tablet (5 mg total) by mouth 2 (two) times daily., Disp: 60 tablet, Rfl: 3   Buprenorphine HCl-Naloxone HCl 8-2 MG FILM, Place 1 Film under the tongue in the morning and at bedtime., Disp: , Rfl:    eplerenone (INSPRA) 50 MG tablet, Take 2 tablets (100 mg total) by mouth daily., Disp: 60 tablet, Rfl: 11   FARXIGA 10 MG TABS tablet, Take 1 tablet (10 mg total) by mouth daily before breakfast., Disp: 30 tablet, Rfl: 11   gabapentin (NEURONTIN) 800 MG tablet, Take 1 tablet (800 mg total) by mouth 3 (three) times daily., Disp: 90 tablet, Rfl: 1   lactulose, encephalopathy, (CHRONULAC) 10 GM/15ML SOLN, Take 30 mLs (20 g total) by mouth 2 (two) times daily., Disp: , Rfl:    metolazone (ZAROXOLYN) 2.5 MG tablet, Take 2.5 mg by mouth 3 (three) times a week., Disp: , Rfl:    metoprolol succinate (TOPROL XL) 25 MG 24 hr tablet, Take 1 tablet (25 mg total) by mouth at bedtime., Disp: 30 tablet, Rfl: 11   ondansetron (ZOFRAN-ODT) 4 MG disintegrating tablet, Take 4 mg by mouth every 8 (eight) hours as needed for nausea or vomiting (dissolve orally)., Disp: , Rfl:    pantoprazole (PROTONIX) 40 MG tablet, Take 40 mg by mouth 2 (two) times daily before a meal., Disp: , Rfl:    polyethylene glycol (MIRALAX / GLYCOLAX) 17 g packet, Take 17 g by mouth as needed for mild constipation (mix and drink as directed)., Disp: , Rfl:    potassium chloride (KLOR-CON) 20 MEQ packet, Take 100 mEq by mouth 3 (three) times daily. Take an extra 40 mEq on days when you take metolazone, Disp: 475 packet, Rfl: 3   pravastatin (PRAVACHOL) 20 MG tablet, Take 1 tablet (20 mg total) by mouth  daily., Disp: 30 tablet, Rfl: 11   sertraline (ZOLOFT) 100 MG tablet, Take 100 mg by mouth daily. At night, Disp: , Rfl:    tamsulosin (FLOMAX) 0.4 MG CAPS capsule, Take 1 capsule (0.4 mg total) by mouth daily. Additional refills will need to come from PCP, Disp: 30 capsule, Rfl: 0   torsemide (DEMADEX) 100 MG tablet, TAKE ONE TABLET BY MOUTH DAILY IN THE MORNING AND EVENING, Disp: 90 tablet, Rfl: 3   triamcinolone (KENALOG) 0.025 % ointment, Apply 1 application. topically 2 (two) times daily., Disp: 30 g, Rfl: 0   VENTOLIN HFA 108 (90 Base) MCG/ACT inhaler, Inhale 1 puff into the lungs every 4 (four) hours as needed for shortness of breath. (Patient taking differently: Inhale 2 puffs into the lungs every 4 (four) hours as needed for shortness of breath.), Disp: 6.7 g,  Rfl: 1   zolpidem (AMBIEN) 10 MG tablet, Take 1 tablet (10 mg total) by mouth at bedtime as needed for sleep., Disp: 30 tablet, Rfl: 0   cariprazine (VRAYLAR) 1.5 MG capsule, Take 1.5 mg by mouth daily. Per Dr. Orvan Falconer, Disp: , Rfl:  Allergies  Allergen Reactions   Penicillins Other (See Comments)    Caused convulsions as a child  Did it involve swelling of the face/tongue/throat, SOB, or low BP? N/A Did it involve sudden or severe rash/hives, skin peeling, or any reaction on the inside of your mouth or nose? N/A Did you need to seek medical attention at a hospital or doctor's office?N/A When did it last happen? N/A If all above answers are "NO", may proceed with cephalosporin use.     Social History   Socioeconomic History   Marital status: Single    Spouse name: Not on file   Number of children: Not on file   Years of education: Not on file   Highest education level: Not on file  Occupational History   Not on file  Tobacco Use   Smoking status: Never   Smokeless tobacco: Never  Vaping Use   Vaping status: Never Used  Substance and Sexual Activity   Alcohol use: Not Currently   Drug use: Not Currently    Types:  Marijuana    Comment: Smoked for 30 years   Sexual activity: Not on file  Other Topics Concern   Not on file  Social History Narrative   Not on file   Social Determinants of Health   Financial Resource Strain: Medium Risk (11/15/2020)   Overall Financial Resource Strain (CARDIA)    Difficulty of Paying Living Expenses: Somewhat hard  Food Insecurity: No Food Insecurity (03/08/2022)   Hunger Vital Sign    Worried About Running Out of Food in the Last Year: Never true    Ran Out of Food in the Last Year: Never true  Transportation Needs: Unmet Transportation Needs (04/21/2022)   PRAPARE - Administrator, Civil Service (Medical): Yes    Lack of Transportation (Non-Medical): Yes  Physical Activity: Not on file  Stress: Not on file  Social Connections: Not on file  Intimate Partner Violence: Not At Risk (03/08/2022)   Humiliation, Afraid, Rape, and Kick questionnaire    Fear of Current or Ex-Partner: No    Emotionally Abused: No    Physically Abused: No    Sexually Abused: No    Physical Exam      Future Appointments  Date Time Provider Department Center  05/04/2023  1:30 PM [facility] PA/NP [facility] None

## 2023-03-17 DIAGNOSIS — I872 Venous insufficiency (chronic) (peripheral): Secondary | ICD-10-CM | POA: Diagnosis not present

## 2023-03-17 DIAGNOSIS — I5020 Unspecified systolic (congestive) heart failure: Secondary | ICD-10-CM | POA: Diagnosis not present

## 2023-03-17 DIAGNOSIS — I110 Hypertensive heart disease with heart failure: Secondary | ICD-10-CM | POA: Diagnosis not present

## 2023-03-17 DIAGNOSIS — E1151 Type 2 diabetes mellitus with diabetic peripheral angiopathy without gangrene: Secondary | ICD-10-CM | POA: Diagnosis not present

## 2023-03-17 DIAGNOSIS — I5032 Chronic diastolic (congestive) heart failure: Secondary | ICD-10-CM | POA: Diagnosis not present

## 2023-03-17 DIAGNOSIS — L97821 Non-pressure chronic ulcer of other part of left lower leg limited to breakdown of skin: Secondary | ICD-10-CM | POA: Diagnosis not present

## 2023-03-17 DIAGNOSIS — I890 Lymphedema, not elsewhere classified: Secondary | ICD-10-CM | POA: Diagnosis not present

## 2023-03-17 DIAGNOSIS — L97811 Non-pressure chronic ulcer of other part of right lower leg limited to breakdown of skin: Secondary | ICD-10-CM | POA: Diagnosis not present

## 2023-03-17 DIAGNOSIS — Z4800 Encounter for change or removal of nonsurgical wound dressing: Secondary | ICD-10-CM | POA: Diagnosis not present

## 2023-03-18 DIAGNOSIS — L97821 Non-pressure chronic ulcer of other part of left lower leg limited to breakdown of skin: Secondary | ICD-10-CM | POA: Diagnosis not present

## 2023-03-18 DIAGNOSIS — I5020 Unspecified systolic (congestive) heart failure: Secondary | ICD-10-CM | POA: Diagnosis not present

## 2023-03-18 DIAGNOSIS — Z4800 Encounter for change or removal of nonsurgical wound dressing: Secondary | ICD-10-CM | POA: Diagnosis not present

## 2023-03-18 DIAGNOSIS — I110 Hypertensive heart disease with heart failure: Secondary | ICD-10-CM | POA: Diagnosis not present

## 2023-03-18 DIAGNOSIS — L97811 Non-pressure chronic ulcer of other part of right lower leg limited to breakdown of skin: Secondary | ICD-10-CM | POA: Diagnosis not present

## 2023-03-18 DIAGNOSIS — I872 Venous insufficiency (chronic) (peripheral): Secondary | ICD-10-CM | POA: Diagnosis not present

## 2023-03-18 DIAGNOSIS — I5032 Chronic diastolic (congestive) heart failure: Secondary | ICD-10-CM | POA: Diagnosis not present

## 2023-03-18 DIAGNOSIS — E1151 Type 2 diabetes mellitus with diabetic peripheral angiopathy without gangrene: Secondary | ICD-10-CM | POA: Diagnosis not present

## 2023-03-18 DIAGNOSIS — I890 Lymphedema, not elsewhere classified: Secondary | ICD-10-CM | POA: Diagnosis not present

## 2023-03-19 DIAGNOSIS — I5032 Chronic diastolic (congestive) heart failure: Secondary | ICD-10-CM | POA: Diagnosis not present

## 2023-03-19 DIAGNOSIS — I110 Hypertensive heart disease with heart failure: Secondary | ICD-10-CM | POA: Diagnosis not present

## 2023-03-19 DIAGNOSIS — I872 Venous insufficiency (chronic) (peripheral): Secondary | ICD-10-CM | POA: Diagnosis not present

## 2023-03-19 DIAGNOSIS — L97821 Non-pressure chronic ulcer of other part of left lower leg limited to breakdown of skin: Secondary | ICD-10-CM | POA: Diagnosis not present

## 2023-03-19 DIAGNOSIS — E1151 Type 2 diabetes mellitus with diabetic peripheral angiopathy without gangrene: Secondary | ICD-10-CM | POA: Diagnosis not present

## 2023-03-19 DIAGNOSIS — Z4800 Encounter for change or removal of nonsurgical wound dressing: Secondary | ICD-10-CM | POA: Diagnosis not present

## 2023-03-19 DIAGNOSIS — L97811 Non-pressure chronic ulcer of other part of right lower leg limited to breakdown of skin: Secondary | ICD-10-CM | POA: Diagnosis not present

## 2023-03-19 DIAGNOSIS — I890 Lymphedema, not elsewhere classified: Secondary | ICD-10-CM | POA: Diagnosis not present

## 2023-03-19 DIAGNOSIS — I5020 Unspecified systolic (congestive) heart failure: Secondary | ICD-10-CM | POA: Diagnosis not present

## 2023-03-20 DIAGNOSIS — L97821 Non-pressure chronic ulcer of other part of left lower leg limited to breakdown of skin: Secondary | ICD-10-CM | POA: Diagnosis not present

## 2023-03-20 DIAGNOSIS — Z4800 Encounter for change or removal of nonsurgical wound dressing: Secondary | ICD-10-CM | POA: Diagnosis not present

## 2023-03-20 DIAGNOSIS — I5020 Unspecified systolic (congestive) heart failure: Secondary | ICD-10-CM | POA: Diagnosis not present

## 2023-03-20 DIAGNOSIS — I890 Lymphedema, not elsewhere classified: Secondary | ICD-10-CM | POA: Diagnosis not present

## 2023-03-20 DIAGNOSIS — I872 Venous insufficiency (chronic) (peripheral): Secondary | ICD-10-CM | POA: Diagnosis not present

## 2023-03-20 DIAGNOSIS — L97811 Non-pressure chronic ulcer of other part of right lower leg limited to breakdown of skin: Secondary | ICD-10-CM | POA: Diagnosis not present

## 2023-03-20 DIAGNOSIS — I110 Hypertensive heart disease with heart failure: Secondary | ICD-10-CM | POA: Diagnosis not present

## 2023-03-20 DIAGNOSIS — I5032 Chronic diastolic (congestive) heart failure: Secondary | ICD-10-CM | POA: Diagnosis not present

## 2023-03-20 DIAGNOSIS — E1151 Type 2 diabetes mellitus with diabetic peripheral angiopathy without gangrene: Secondary | ICD-10-CM | POA: Diagnosis not present

## 2023-03-21 DIAGNOSIS — R32 Unspecified urinary incontinence: Secondary | ICD-10-CM | POA: Diagnosis not present

## 2023-03-22 ENCOUNTER — Other Ambulatory Visit (HOSPITAL_COMMUNITY): Payer: Self-pay | Admitting: Cardiology

## 2023-03-22 DIAGNOSIS — E1151 Type 2 diabetes mellitus with diabetic peripheral angiopathy without gangrene: Secondary | ICD-10-CM | POA: Diagnosis not present

## 2023-03-22 DIAGNOSIS — L97821 Non-pressure chronic ulcer of other part of left lower leg limited to breakdown of skin: Secondary | ICD-10-CM | POA: Diagnosis not present

## 2023-03-22 DIAGNOSIS — I5032 Chronic diastolic (congestive) heart failure: Secondary | ICD-10-CM | POA: Diagnosis not present

## 2023-03-22 DIAGNOSIS — Z4800 Encounter for change or removal of nonsurgical wound dressing: Secondary | ICD-10-CM | POA: Diagnosis not present

## 2023-03-22 DIAGNOSIS — I872 Venous insufficiency (chronic) (peripheral): Secondary | ICD-10-CM | POA: Diagnosis not present

## 2023-03-22 DIAGNOSIS — I5020 Unspecified systolic (congestive) heart failure: Secondary | ICD-10-CM | POA: Diagnosis not present

## 2023-03-22 DIAGNOSIS — I890 Lymphedema, not elsewhere classified: Secondary | ICD-10-CM | POA: Diagnosis not present

## 2023-03-22 DIAGNOSIS — L97811 Non-pressure chronic ulcer of other part of right lower leg limited to breakdown of skin: Secondary | ICD-10-CM | POA: Diagnosis not present

## 2023-03-22 DIAGNOSIS — I110 Hypertensive heart disease with heart failure: Secondary | ICD-10-CM | POA: Diagnosis not present

## 2023-03-23 ENCOUNTER — Other Ambulatory Visit (HOSPITAL_COMMUNITY): Payer: Self-pay | Admitting: Family Medicine

## 2023-03-23 DIAGNOSIS — L97821 Non-pressure chronic ulcer of other part of left lower leg limited to breakdown of skin: Secondary | ICD-10-CM | POA: Diagnosis not present

## 2023-03-23 DIAGNOSIS — Z4800 Encounter for change or removal of nonsurgical wound dressing: Secondary | ICD-10-CM | POA: Diagnosis not present

## 2023-03-23 DIAGNOSIS — I890 Lymphedema, not elsewhere classified: Secondary | ICD-10-CM | POA: Diagnosis not present

## 2023-03-23 DIAGNOSIS — I110 Hypertensive heart disease with heart failure: Secondary | ICD-10-CM | POA: Diagnosis not present

## 2023-03-23 DIAGNOSIS — I5020 Unspecified systolic (congestive) heart failure: Secondary | ICD-10-CM | POA: Diagnosis not present

## 2023-03-23 DIAGNOSIS — I5032 Chronic diastolic (congestive) heart failure: Secondary | ICD-10-CM | POA: Diagnosis not present

## 2023-03-23 DIAGNOSIS — L97811 Non-pressure chronic ulcer of other part of right lower leg limited to breakdown of skin: Secondary | ICD-10-CM | POA: Diagnosis not present

## 2023-03-23 DIAGNOSIS — E1151 Type 2 diabetes mellitus with diabetic peripheral angiopathy without gangrene: Secondary | ICD-10-CM | POA: Diagnosis not present

## 2023-03-23 DIAGNOSIS — I872 Venous insufficiency (chronic) (peripheral): Secondary | ICD-10-CM | POA: Diagnosis not present

## 2023-03-24 DIAGNOSIS — F1120 Opioid dependence, uncomplicated: Secondary | ICD-10-CM | POA: Diagnosis not present

## 2023-03-25 DIAGNOSIS — I890 Lymphedema, not elsewhere classified: Secondary | ICD-10-CM | POA: Diagnosis not present

## 2023-03-25 DIAGNOSIS — L97821 Non-pressure chronic ulcer of other part of left lower leg limited to breakdown of skin: Secondary | ICD-10-CM | POA: Diagnosis not present

## 2023-03-25 DIAGNOSIS — I110 Hypertensive heart disease with heart failure: Secondary | ICD-10-CM | POA: Diagnosis not present

## 2023-03-25 DIAGNOSIS — I5032 Chronic diastolic (congestive) heart failure: Secondary | ICD-10-CM | POA: Diagnosis not present

## 2023-03-25 DIAGNOSIS — E1151 Type 2 diabetes mellitus with diabetic peripheral angiopathy without gangrene: Secondary | ICD-10-CM | POA: Diagnosis not present

## 2023-03-25 DIAGNOSIS — I872 Venous insufficiency (chronic) (peripheral): Secondary | ICD-10-CM | POA: Diagnosis not present

## 2023-03-25 DIAGNOSIS — L97811 Non-pressure chronic ulcer of other part of right lower leg limited to breakdown of skin: Secondary | ICD-10-CM | POA: Diagnosis not present

## 2023-03-25 DIAGNOSIS — I5020 Unspecified systolic (congestive) heart failure: Secondary | ICD-10-CM | POA: Diagnosis not present

## 2023-03-25 DIAGNOSIS — Z4800 Encounter for change or removal of nonsurgical wound dressing: Secondary | ICD-10-CM | POA: Diagnosis not present

## 2023-03-26 DIAGNOSIS — L97821 Non-pressure chronic ulcer of other part of left lower leg limited to breakdown of skin: Secondary | ICD-10-CM | POA: Diagnosis not present

## 2023-03-26 DIAGNOSIS — I110 Hypertensive heart disease with heart failure: Secondary | ICD-10-CM | POA: Diagnosis not present

## 2023-03-26 DIAGNOSIS — Z4800 Encounter for change or removal of nonsurgical wound dressing: Secondary | ICD-10-CM | POA: Diagnosis not present

## 2023-03-26 DIAGNOSIS — E1151 Type 2 diabetes mellitus with diabetic peripheral angiopathy without gangrene: Secondary | ICD-10-CM | POA: Diagnosis not present

## 2023-03-26 DIAGNOSIS — I5020 Unspecified systolic (congestive) heart failure: Secondary | ICD-10-CM | POA: Diagnosis not present

## 2023-03-26 DIAGNOSIS — L97811 Non-pressure chronic ulcer of other part of right lower leg limited to breakdown of skin: Secondary | ICD-10-CM | POA: Diagnosis not present

## 2023-03-26 DIAGNOSIS — I872 Venous insufficiency (chronic) (peripheral): Secondary | ICD-10-CM | POA: Diagnosis not present

## 2023-03-26 DIAGNOSIS — I890 Lymphedema, not elsewhere classified: Secondary | ICD-10-CM | POA: Diagnosis not present

## 2023-03-26 DIAGNOSIS — I5032 Chronic diastolic (congestive) heart failure: Secondary | ICD-10-CM | POA: Diagnosis not present

## 2023-03-29 ENCOUNTER — Telehealth (HOSPITAL_COMMUNITY): Payer: Self-pay | Admitting: Emergency Medicine

## 2023-03-29 NOTE — Telephone Encounter (Signed)
HF Paramedicine Message to [facility]  Pharmacy (if applicable):   Issue/reason for call: Sage's not feeling well. Weak and audible wheezing per aid.  Denies any recent falls.   Medication refill? N/a  Blood pressure? 135/67, HR 78, 98%, CardioMems 5, goal 9.    Weight gain? (How much over a week?) No Weight loss? (How much over a week?) 9 lbs in 2x weeks, 4 lbs in the last 3 days.  12/3:300 lbs 12/13: 295 lbs 12/15: 291 lbs  Too weak to weigh today   Edema ? (Baseline or worse?) decreased per aid  Dyspnea?  (Baseline or worse?) worse   Medications are taken as prescribed?  Reported yes  Plan to see Calem tomorrow Morning for regular home visit.   Benson Setting EMT-P Community Paramedic  [phone]

## 2023-03-29 NOTE — Telephone Encounter (Signed)
  This doesn't sound like its related to heart failure.   He does have an inhaler and should use that. He should contact PCP.   Kordell Jafri NP-C  11:36 AM

## 2023-03-29 NOTE — Telephone Encounter (Signed)
Noted  Charles Gray with para medicine aware and will notify patient

## 2023-03-30 ENCOUNTER — Other Ambulatory Visit (HOSPITAL_COMMUNITY): Payer: Self-pay | Admitting: Emergency Medicine

## 2023-03-30 DIAGNOSIS — I872 Venous insufficiency (chronic) (peripheral): Secondary | ICD-10-CM | POA: Diagnosis not present

## 2023-03-30 DIAGNOSIS — E1151 Type 2 diabetes mellitus with diabetic peripheral angiopathy without gangrene: Secondary | ICD-10-CM | POA: Diagnosis not present

## 2023-03-30 DIAGNOSIS — I5020 Unspecified systolic (congestive) heart failure: Secondary | ICD-10-CM | POA: Diagnosis not present

## 2023-03-30 DIAGNOSIS — I5032 Chronic diastolic (congestive) heart failure: Secondary | ICD-10-CM | POA: Diagnosis not present

## 2023-03-30 DIAGNOSIS — I110 Hypertensive heart disease with heart failure: Secondary | ICD-10-CM | POA: Diagnosis not present

## 2023-03-30 DIAGNOSIS — I890 Lymphedema, not elsewhere classified: Secondary | ICD-10-CM | POA: Diagnosis not present

## 2023-03-30 DIAGNOSIS — Z4800 Encounter for change or removal of nonsurgical wound dressing: Secondary | ICD-10-CM | POA: Diagnosis not present

## 2023-03-30 DIAGNOSIS — L97811 Non-pressure chronic ulcer of other part of right lower leg limited to breakdown of skin: Secondary | ICD-10-CM | POA: Diagnosis not present

## 2023-03-30 DIAGNOSIS — L97821 Non-pressure chronic ulcer of other part of left lower leg limited to breakdown of skin: Secondary | ICD-10-CM | POA: Diagnosis not present

## 2023-03-30 NOTE — Telephone Encounter (Signed)
Patient notified to contact and notified to follow up with PCP regarding weakness. Pt was also advised to use inhaler PRN as prescribed.   Pt understood and agreed.   Benson Setting EMT-P Community Paramedic  [phone]

## 2023-03-30 NOTE — Progress Notes (Signed)
Paramedicine Encounter    Patient ID: Charles Gray, male    DOB: 16-Oct-1955, 67 y.o.   MRN: [id number]   Complaints - pain when urinating, like cannot fully empty bladder. Dark urine x1 week. Increased weakness x3 days.   Assessment - slight ammonia smell.   Compliance with meds - no missed medication doses.   Pill box filled - for 2x weeks   Refills needed -  vraylar, inspira, farxiga, metolazone, torsemide.   Meds changes since last visit - added Vraylar from Dr. Orvan Falconer.     Social changes -  Continued PT 2x/week. ]   VISIT SUMMARY** I met with Charles Gray in his home. Pt reported continued weakness, worse than 2x weeks ago. Pt also presented with a 11lb weight loss, noting decreased edema. I noted an ammonia smell upon entering and asked Charles Gray about his urine. Pt reported pain and feeling like he cannot empty his bladder. Pt reported darker, foul smelling urine x1 week. PCP contacted and UA was ordered. Pt's pill boxes were filled for 2x weeks. Will follow up in 2x weeks.   BP 120/60   Pulse 74   Resp 16   Wt 289 lb (131.1 kg)   SpO2 99%   BMI 30.22 kg/m  Weight yesterday-291 lbs  Last visit weight-300 lbs  CardioMems- 6, goal - 9.   Charles Gray EMT-P Community Paramedic  [phone]    ACTION: Home visit completed     Patient Care Team: Nonnie Done., MD as PCP - General (Family Medicine) Laurey Morale, MD as PCP - Cardiology (Cardiology) [name], LCSW as Social Worker (Licensed Clinical Social Worker)  Patient Active Problem List   Diagnosis Date Noted   Acute on chronic heart failure with preserved ejection fraction (HFpEF) (HCC) 03/04/2022   Dyslipidemia 07/17/2021   Lymphedema 07/08/2021   Melena    Gastric polyps    UGIB (upper gastrointestinal bleed) 04/06/2021   Acute encephalopathy 12/21/2020   COPD (chronic obstructive pulmonary disease) (HCC) 12/21/2020   Chronic diastolic (congestive) heart failure (HCC) 11/14/2020   Syncope  10/28/2020   CHF (congestive heart failure) (HCC) 10/28/2020   Chronic venous stasis dermatitis of both lower extremities 08/27/2020   Cellulitis of both lower extremities 08/14/2020   Iron deficiency anemia 08/14/2020   GERD without esophagitis 06/10/2020   Atrial fibrillation with rapid ventricular response (HCC) 03/24/2020   Acute on chronic right heart failure (HCC) 01/27/2020   Acute respiratory failure with hypoxia (HCC)    Fatty liver disease, nonalcoholic    Shock (HCC)    Personal history of DVT (deep vein thrombosis) 12/26/2019   Chronic anticoagulation 12/26/2019   Palpitations 12/26/2019   Paroxysmal atrial fibrillation (HCC) 12/23/2019   Heart failure, systolic, acute (HCC) 08/24/2019   Acute on chronic congestive heart failure (HCC)    Acute on chronic diastolic CHF (congestive heart failure) (HCC) 08/23/2019   Chest pain 08/23/2019   Lactic acidosis 08/23/2019   History of pulmonary embolism 08/12/2019   Atrial tachycardia (HCC) 08/12/2019   Occasional tremors 08/12/2019   OSA (obstructive sleep apnea) 08/12/2019   AKI (acute kidney injury) (HCC) 08/08/2019   Acute on chronic right-sided heart failure (HCC) 08/06/2019   Acute pulmonary embolism (HCC) 06/08/2019   Normocytic anemia 06/08/2019   Pulmonary emboli (HCC) 06/08/2019   Chronic heart failure with preserved ejection fraction (HCC) 05/30/2019   Class 2 severe obesity due to excess calories with serious comorbidity and body mass index (BMI) of 39.0 to 39.9 in adult (  HCC) 05/30/2019   Morbid obesity with BMI of 40.0-44.9, adult (HCC) 05/30/2019   Hyponatremia    Drug induced constipation    Peripheral edema    Hypotension due to drugs    Hypoalbuminemia due to protein-calorie malnutrition (HCC)    Thrombocytopenia (HCC)    Anemia of chronic disease    Cirrhosis of liver without ascites (HCC)    Chronic pain syndrome    Debility 03/13/2019   Portal hypertensive gastropathy (HCC) 03/06/2019   Dyspnea     GIB (gastrointestinal bleeding) 03/04/2019   Mixed hyperlipidemia 01/24/2019   Insomnia 01/24/2019   Hyperlipidemia 01/24/2019   Essential hypertension 01/24/2019   Lumbar disc herniation 01/24/2019    Current Outpatient Medications:    apixaban (ELIQUIS) 5 MG TABS tablet, Take 1 tablet (5 mg total) by mouth 2 (two) times daily., Disp: 60 tablet, Rfl: 3   Buprenorphine HCl-Naloxone HCl 8-2 MG FILM, Place 1 Film under the tongue in the morning and at bedtime., Disp: , Rfl:    cariprazine (VRAYLAR) 1.5 MG capsule, Take 1.5 mg by mouth daily. Per Dr. Orvan Falconer, Disp: , Rfl:    eplerenone (INSPRA) 50 MG tablet, Take 2 tablets (100 mg total) by mouth daily., Disp: 60 tablet, Rfl: 11   FARXIGA 10 MG TABS tablet, Take 1 tablet (10 mg total) by mouth daily before breakfast., Disp: 30 tablet, Rfl: 11   gabapentin (NEURONTIN) 800 MG tablet, Take 1 tablet (800 mg total) by mouth 3 (three) times daily., Disp: 90 tablet, Rfl: 1   lactulose, encephalopathy, (CHRONULAC) 10 GM/15ML SOLN, Take 30 mLs (20 g total) by mouth 2 (two) times daily., Disp: , Rfl:    metolazone (ZAROXOLYN) 2.5 MG tablet, Take 2.5 mg by mouth 3 (three) times a week., Disp: , Rfl:    metoprolol succinate (TOPROL-XL) 25 MG 24 hr tablet, TAKE ONE TABLET BY MOUTH AT BEDTIME, Disp: 30 tablet, Rfl: 11   ondansetron (ZOFRAN-ODT) 4 MG disintegrating tablet, Take 4 mg by mouth every 8 (eight) hours as needed for nausea or vomiting (dissolve orally)., Disp: , Rfl:    pantoprazole (PROTONIX) 40 MG tablet, Take 40 mg by mouth 2 (two) times daily before a meal., Disp: , Rfl:    polyethylene glycol (MIRALAX / GLYCOLAX) 17 g packet, Take 17 g by mouth as needed for mild constipation (mix and drink as directed)., Disp: , Rfl:    potassium chloride (KLOR-CON) 20 MEQ packet, Take 100 mEq by mouth 3 (three) times daily. Take an extra 40 mEq on days when you take metolazone, Disp: 475 packet, Rfl: 3   pravastatin (PRAVACHOL) 20 MG tablet, Take 1 tablet (20  mg total) by mouth daily., Disp: 30 tablet, Rfl: 11   sertraline (ZOLOFT) 100 MG tablet, Take 100 mg by mouth daily. At night, Disp: , Rfl:    tamsulosin (FLOMAX) 0.4 MG CAPS capsule, Take 1 capsule (0.4 mg total) by mouth daily. Additional refills will need to come from PCP, Disp: 30 capsule, Rfl: 0   torsemide (DEMADEX) 100 MG tablet, TAKE ONE TABLET BY MOUTH DAILY IN THE MORNING AND EVENING, Disp: 90 tablet, Rfl: 3   triamcinolone (KENALOG) 0.025 % ointment, Apply 1 application. topically 2 (two) times daily., Disp: 30 g, Rfl: 0   VENTOLIN HFA 108 (90 Base) MCG/ACT inhaler, Inhale 1 puff into the lungs every 4 (four) hours as needed for shortness of breath. (Patient taking differently: Inhale 2 puffs into the lungs every 4 (four) hours as needed for shortness of breath.),  Disp: 6.7 g, Rfl: 1   zolpidem (AMBIEN) 10 MG tablet, Take 1 tablet (10 mg total) by mouth at bedtime as needed for sleep., Disp: 30 tablet, Rfl: 0 Allergies  Allergen Reactions   Penicillins Other (See Comments)    Caused convulsions as a child  Did it involve swelling of the face/tongue/throat, SOB, or low BP? N/A Did it involve sudden or severe rash/hives, skin peeling, or any reaction on the inside of your mouth or nose? N/A Did you need to seek medical attention at a hospital or doctor's office?N/A When did it last happen? N/A If all above answers are "NO", may proceed with cephalosporin use.     Social History   Socioeconomic History   Marital status: Single    Spouse name: Not on file   Number of children: Not on file   Years of education: Not on file   Highest education level: Not on file  Occupational History   Not on file  Tobacco Use   Smoking status: Never   Smokeless tobacco: Never  Vaping Use   Vaping status: Never Used  Substance and Sexual Activity   Alcohol use: Not Currently   Drug use: Not Currently    Types: Marijuana    Comment: Smoked for 30 years   Sexual activity: Not on file  Other  Topics Concern   Not on file  Social History Narrative   Not on file   Social Drivers of Health   Financial Resource Strain: Medium Risk (11/15/2020)   Overall Financial Resource Strain (CARDIA)    Difficulty of Paying Living Expenses: Somewhat hard  Food Insecurity: No Food Insecurity (03/08/2022)   Hunger Vital Sign    Worried About Running Out of Food in the Last Year: Never true    Ran Out of Food in the Last Year: Never true  Transportation Needs: Unmet Transportation Needs (04/21/2022)   PRAPARE - Administrator, Civil Service (Medical): Yes    Lack of Transportation (Non-Medical): Yes  Physical Activity: Not on file  Stress: Not on file  Social Connections: Not on file  Intimate Partner Violence: Not At Risk (03/08/2022)   Humiliation, Afraid, Rape, and Kick questionnaire    Fear of Current or Ex-Partner: No    Emotionally Abused: No    Physically Abused: No    Sexually Abused: No    Physical Exam      Future Appointments  Date Time Provider Department Center  05/04/2023  1:30 PM [facility] PA/NP [facility] None

## 2023-04-01 DIAGNOSIS — I872 Venous insufficiency (chronic) (peripheral): Secondary | ICD-10-CM | POA: Diagnosis not present

## 2023-04-01 DIAGNOSIS — Z4800 Encounter for change or removal of nonsurgical wound dressing: Secondary | ICD-10-CM | POA: Diagnosis not present

## 2023-04-01 DIAGNOSIS — L97811 Non-pressure chronic ulcer of other part of right lower leg limited to breakdown of skin: Secondary | ICD-10-CM | POA: Diagnosis not present

## 2023-04-01 DIAGNOSIS — I5032 Chronic diastolic (congestive) heart failure: Secondary | ICD-10-CM | POA: Diagnosis not present

## 2023-04-01 DIAGNOSIS — I5020 Unspecified systolic (congestive) heart failure: Secondary | ICD-10-CM | POA: Diagnosis not present

## 2023-04-01 DIAGNOSIS — I110 Hypertensive heart disease with heart failure: Secondary | ICD-10-CM | POA: Diagnosis not present

## 2023-04-01 DIAGNOSIS — L97821 Non-pressure chronic ulcer of other part of left lower leg limited to breakdown of skin: Secondary | ICD-10-CM | POA: Diagnosis not present

## 2023-04-01 DIAGNOSIS — E1151 Type 2 diabetes mellitus with diabetic peripheral angiopathy without gangrene: Secondary | ICD-10-CM | POA: Diagnosis not present

## 2023-04-01 DIAGNOSIS — I890 Lymphedema, not elsewhere classified: Secondary | ICD-10-CM | POA: Diagnosis not present

## 2023-04-05 DIAGNOSIS — I5032 Chronic diastolic (congestive) heart failure: Secondary | ICD-10-CM | POA: Diagnosis not present

## 2023-04-05 DIAGNOSIS — I5020 Unspecified systolic (congestive) heart failure: Secondary | ICD-10-CM | POA: Diagnosis not present

## 2023-04-05 DIAGNOSIS — E1151 Type 2 diabetes mellitus with diabetic peripheral angiopathy without gangrene: Secondary | ICD-10-CM | POA: Diagnosis not present

## 2023-04-05 DIAGNOSIS — Z4800 Encounter for change or removal of nonsurgical wound dressing: Secondary | ICD-10-CM | POA: Diagnosis not present

## 2023-04-05 DIAGNOSIS — I890 Lymphedema, not elsewhere classified: Secondary | ICD-10-CM | POA: Diagnosis not present

## 2023-04-05 DIAGNOSIS — L97811 Non-pressure chronic ulcer of other part of right lower leg limited to breakdown of skin: Secondary | ICD-10-CM | POA: Diagnosis not present

## 2023-04-05 DIAGNOSIS — I872 Venous insufficiency (chronic) (peripheral): Secondary | ICD-10-CM | POA: Diagnosis not present

## 2023-04-05 DIAGNOSIS — I110 Hypertensive heart disease with heart failure: Secondary | ICD-10-CM | POA: Diagnosis not present

## 2023-04-05 DIAGNOSIS — L97821 Non-pressure chronic ulcer of other part of left lower leg limited to breakdown of skin: Secondary | ICD-10-CM | POA: Diagnosis not present

## 2023-04-06 ENCOUNTER — Other Ambulatory Visit (HOSPITAL_COMMUNITY): Payer: Self-pay

## 2023-04-06 MED ORDER — METOLAZONE 2.5 MG PO TABS: 2.5000 mg | ORAL_TABLET | ORAL | 1 refills | Status: DC

## 2023-04-08 ENCOUNTER — Telehealth (HOSPITAL_COMMUNITY): Payer: Self-pay | Admitting: Emergency Medicine

## 2023-04-08 DIAGNOSIS — I890 Lymphedema, not elsewhere classified: Secondary | ICD-10-CM | POA: Diagnosis not present

## 2023-04-08 DIAGNOSIS — L97821 Non-pressure chronic ulcer of other part of left lower leg limited to breakdown of skin: Secondary | ICD-10-CM | POA: Diagnosis not present

## 2023-04-08 DIAGNOSIS — E1151 Type 2 diabetes mellitus with diabetic peripheral angiopathy without gangrene: Secondary | ICD-10-CM | POA: Diagnosis not present

## 2023-04-08 DIAGNOSIS — L97811 Non-pressure chronic ulcer of other part of right lower leg limited to breakdown of skin: Secondary | ICD-10-CM | POA: Diagnosis not present

## 2023-04-08 DIAGNOSIS — Z4800 Encounter for change or removal of nonsurgical wound dressing: Secondary | ICD-10-CM | POA: Diagnosis not present

## 2023-04-08 DIAGNOSIS — I872 Venous insufficiency (chronic) (peripheral): Secondary | ICD-10-CM | POA: Diagnosis not present

## 2023-04-08 DIAGNOSIS — I5020 Unspecified systolic (congestive) heart failure: Secondary | ICD-10-CM | POA: Diagnosis not present

## 2023-04-08 DIAGNOSIS — I5032 Chronic diastolic (congestive) heart failure: Secondary | ICD-10-CM | POA: Diagnosis not present

## 2023-04-08 DIAGNOSIS — I110 Hypertensive heart disease with heart failure: Secondary | ICD-10-CM | POA: Diagnosis not present

## 2023-04-08 NOTE — Telephone Encounter (Signed)
HF Paramedicine Message to [facility]  Pharmacy (if applicable):[facility]    Issue/reason for call:Need refill for Metolazone  Medication refill? Need refill for Metolazone sent to pharmacy   Blood pressure? N/A  Weight:  12/17 - 289 lbs  12/26 - 285 lbs   Edema ?  decreased  Dyspnea? Baseline   Medications are taken as prescribed? Been without metolazone since 12/19.   CardioMems : 4, goal 9.   Aid concerned about continuing metolzone at current dose due to weight loss, decreased edema, and fluid depletion. -- please advise  Benson Setting EMT-P Community Paramedic  [phone]

## 2023-04-12 DIAGNOSIS — E1151 Type 2 diabetes mellitus with diabetic peripheral angiopathy without gangrene: Secondary | ICD-10-CM | POA: Diagnosis not present

## 2023-04-12 DIAGNOSIS — L97811 Non-pressure chronic ulcer of other part of right lower leg limited to breakdown of skin: Secondary | ICD-10-CM | POA: Diagnosis not present

## 2023-04-12 DIAGNOSIS — I890 Lymphedema, not elsewhere classified: Secondary | ICD-10-CM | POA: Diagnosis not present

## 2023-04-12 DIAGNOSIS — I872 Venous insufficiency (chronic) (peripheral): Secondary | ICD-10-CM | POA: Diagnosis not present

## 2023-04-12 DIAGNOSIS — I110 Hypertensive heart disease with heart failure: Secondary | ICD-10-CM | POA: Diagnosis not present

## 2023-04-12 DIAGNOSIS — L97821 Non-pressure chronic ulcer of other part of left lower leg limited to breakdown of skin: Secondary | ICD-10-CM | POA: Diagnosis not present

## 2023-04-12 DIAGNOSIS — Z4800 Encounter for change or removal of nonsurgical wound dressing: Secondary | ICD-10-CM | POA: Diagnosis not present

## 2023-04-12 DIAGNOSIS — I5020 Unspecified systolic (congestive) heart failure: Secondary | ICD-10-CM | POA: Diagnosis not present

## 2023-04-12 DIAGNOSIS — I5032 Chronic diastolic (congestive) heart failure: Secondary | ICD-10-CM | POA: Diagnosis not present

## 2023-04-13 ENCOUNTER — Telehealth (HOSPITAL_COMMUNITY): Payer: Self-pay

## 2023-04-13 ENCOUNTER — Other Ambulatory Visit (HOSPITAL_COMMUNITY): Payer: Self-pay

## 2023-04-13 NOTE — Telephone Encounter (Signed)
  He can stop farxiga given multiple urinary concerns.  Advise Dr Shirlee Latch would prefer he stay on farxiga but ultimately it is his choice.   Konstantinos Cordoba NP-C  4:59 PM

## 2023-04-13 NOTE — Telephone Encounter (Signed)
 HF Paramedicine Message to [facility]  Pharmacy (if applicable): [facility]   Issue/reason for call: Dark, painful, foul smelling urine x 3 weeks. Kidney/flank pain. Pt requests again to D/C Farxiga  advising that he has not had any benefit and lots of side effects.   Medication refill? Na  Blood pressure? 120/70  Weight gain? (How much over a week?)  292 Yest.  295 Today.   Cardio Mems: 7, goal 9  Weight loss? (How much over a week?)no  Edema ? (Baseline or worse?)Better  Dyspnea?  (Baseline or worse?) baseline   Medications are taken as prescribed? Missed 1 metolazone  dose this week due to Pharmacy retrieval problem.   Powell Mirza, EMT-Paramedic [phone] 04/13/2023

## 2023-04-13 NOTE — Progress Notes (Signed)
 Paramedicine Encounter    Patient ID: Charles Gray, male    DOB: [birth year], 67 y.o.   MRN: [id number]   Complaints - continued urinary problems, pain when urinating, incontinence, foul smelling, dark urine. Global fatigue,   Assessment - lung sounds clear, baseline lower leg swelling.   Compliance with meds - no missed doses noted  Pill box filled - for 2x weeks   Refills needed -vrayaar, gabapentin , metoprolol , farxiga    Meds changes since last visit - vraylar moved to evening due to drogginess during the day. Approved by Dr. Elaine.     Social changes - none   VISIT SUMMARY**  I met with [name] in the home today. [name] continued to complain of dark, painful, foul smelling urine.Pt complains of flank pain. Pt advised that it has been increasing since starting farxiga  a month ago. I notified the clinic of same and he has elected to stop taking it despite Dr. Keneth wishes he stay on it. Vitals and assessment as noted. Weight noted to be up this week from last visit- there is potential he missed some metolazone  this week. I educated the importance of med compliance and he verbalized agreement. All patient concerns addressed and discussed. Home visit complete. I will see him in two weeks.    Wt 295 lb (133.8 kg)   BMI 30.85 kg/m  Weight yesterday-292 lbs Last visit weight-289 lbs   CardioMems: 7, goal 9.   ACTION: Home visit completed     Patient Care Team: Sabas Norleen PARAS., MD as PCP - General (Family Medicine) Rolan Ezra RAMAN, MD as PCP - Cardiology (Cardiology) Cathern Andriette DEL, LCSW as Social Worker (Licensed Clinical Social Worker)  Patient Active Problem List   Diagnosis Date Noted   Acute on chronic heart failure with preserved ejection fraction (HFpEF) (HCC) 03/04/2022   Dyslipidemia 07/17/2021   Lymphedema 07/08/2021   Melena    Gastric polyps    UGIB (upper gastrointestinal bleed) 04/06/2021   Acute encephalopathy 12/21/2020   COPD (chronic  obstructive pulmonary disease) (HCC) 12/21/2020   Chronic diastolic (congestive) heart failure (HCC) 11/14/2020   Syncope 10/28/2020   CHF (congestive heart failure) (HCC) 10/28/2020   Chronic venous stasis dermatitis of both lower extremities 08/27/2020   Cellulitis of both lower extremities 08/14/2020   Iron  deficiency anemia 08/14/2020   GERD without esophagitis 06/10/2020   Atrial fibrillation with rapid ventricular response (HCC) 03/24/2020   Acute on chronic right heart failure (HCC) 01/27/2020   Acute respiratory failure with hypoxia (HCC)    Fatty liver disease, nonalcoholic    Shock (HCC)    Personal history of DVT (deep vein thrombosis) 12/26/2019   Chronic anticoagulation 12/26/2019   Palpitations 12/26/2019   Paroxysmal atrial fibrillation (HCC) 12/23/2019   Heart failure, systolic, acute (HCC) 08/24/2019   Acute on chronic congestive heart failure (HCC)    Acute on chronic diastolic CHF (congestive heart failure) (HCC) 08/23/2019   Chest pain 08/23/2019   Lactic acidosis 08/23/2019   History of pulmonary embolism 08/12/2019   Atrial tachycardia (HCC) 08/12/2019   Occasional tremors 08/12/2019   OSA (obstructive sleep apnea) 08/12/2019   AKI (acute kidney injury) (HCC) 08/08/2019   Acute on chronic right-sided heart failure (HCC) 08/06/2019   Acute pulmonary embolism (HCC) 06/08/2019   Normocytic anemia 06/08/2019   Pulmonary emboli (HCC) 06/08/2019   Chronic heart failure with preserved ejection fraction (HCC) 05/30/2019   Class 2 severe obesity due to excess calories with serious comorbidity and body mass index (BMI)  of 39.0 to 39.9 in adult [facility]) 05/30/2019   Morbid obesity with BMI of 40.0-44.9, adult (HCC) 05/30/2019   Hyponatremia    Drug induced constipation    Peripheral edema    Hypotension due to drugs    Hypoalbuminemia due to protein-calorie malnutrition (HCC)    Thrombocytopenia (HCC)    Anemia of chronic disease    Cirrhosis of liver without ascites  (HCC)    Chronic pain syndrome    Debility 03/13/2019   Portal hypertensive gastropathy (HCC) 03/06/2019   Dyspnea    GIB (gastrointestinal bleeding) 03/04/2019   Mixed hyperlipidemia 01/24/2019   Insomnia 01/24/2019   Hyperlipidemia 01/24/2019   Essential hypertension 01/24/2019   Lumbar disc herniation 01/24/2019    Current Outpatient Medications:    apixaban  (ELIQUIS ) 5 MG TABS tablet, Take 1 tablet (5 mg total) by mouth 2 (two) times daily., Disp: 60 tablet, Rfl: 3   Buprenorphine  HCl-Naloxone  HCl 8-2 MG FILM, Place 1 Film under the tongue in the morning and at bedtime., Disp: , Rfl:    cariprazine (VRAYLAR) 1.5 MG capsule, Take 1.5 mg by mouth daily. Per Dr. Elaine, Disp: , Rfl:    eplerenone  (INSPRA ) 50 MG tablet, Take 2 tablets (100 mg total) by mouth daily., Disp: 60 tablet, Rfl: 11   FARXIGA  10 MG TABS tablet, Take 1 tablet (10 mg total) by mouth daily before breakfast., Disp: 30 tablet, Rfl: 11   gabapentin  (NEURONTIN ) 800 MG tablet, Take 1 tablet (800 mg total) by mouth 3 (three) times daily., Disp: 90 tablet, Rfl: 1   lactulose , encephalopathy, (CHRONULAC ) 10 GM/15ML SOLN, Take 30 mLs (20 g total) by mouth 2 (two) times daily., Disp: , Rfl:    metolazone  (ZAROXOLYN ) 2.5 MG tablet, Take 1 tablet (2.5 mg total) by mouth 3 (three) times a week., Disp: 12 tablet, Rfl: 1   metoprolol  succinate (TOPROL -XL) 25 MG 24 hr tablet, TAKE ONE TABLET BY MOUTH AT BEDTIME, Disp: 30 tablet, Rfl: 11   ondansetron  (ZOFRAN -ODT) 4 MG disintegrating tablet, Take 4 mg by mouth every 8 (eight) hours as needed for nausea or vomiting (dissolve orally)., Disp: , Rfl:    pantoprazole  (PROTONIX ) 40 MG tablet, Take 40 mg by mouth 2 (two) times daily before a meal., Disp: , Rfl:    polyethylene glycol (MIRALAX  / GLYCOLAX ) 17 g packet, Take 17 g by mouth as needed for mild constipation (mix and drink as directed)., Disp: , Rfl:    potassium chloride  (KLOR-CON ) 20 MEQ packet, Take 100 mEq by mouth 3 (three)  times daily. Take an extra 40 mEq on days when you take metolazone , Disp: 475 packet, Rfl: 3   pravastatin  (PRAVACHOL ) 20 MG tablet, Take 1 tablet (20 mg total) by mouth daily., Disp: 30 tablet, Rfl: 11   sertraline  (ZOLOFT ) 100 MG tablet, Take 100 mg by mouth daily. At night, Disp: , Rfl:    tamsulosin  (FLOMAX ) 0.4 MG CAPS capsule, Take 1 capsule (0.4 mg total) by mouth daily. Additional refills will need to come from PCP, Disp: 30 capsule, Rfl: 0   torsemide  (DEMADEX ) 100 MG tablet, TAKE ONE TABLET BY MOUTH DAILY IN THE MORNING AND EVENING, Disp: 90 tablet, Rfl: 3   triamcinolone  (KENALOG ) 0.025 % ointment, Apply 1 application. topically 2 (two) times daily., Disp: 30 g, Rfl: 0   VENTOLIN  HFA 108 (90 Base) MCG/ACT inhaler, Inhale 1 puff into the lungs every 4 (four) hours as needed for shortness of breath. (Patient taking differently: Inhale 2 puffs into the lungs  every 4 (four) hours as needed for shortness of breath.), Disp: 6.7 g, Rfl: 1   zolpidem  (AMBIEN ) 10 MG tablet, Take 1 tablet (10 mg total) by mouth at bedtime as needed for sleep., Disp: 30 tablet, Rfl: 0 Allergies  Allergen Reactions   Penicillins Other (See Comments)    Caused convulsions as a child  Did it involve swelling of the face/tongue/throat, SOB, or low BP? N/A Did it involve sudden or severe rash/hives, skin peeling, or any reaction on the inside of your mouth or nose? N/A Did you need to seek medical attention at a hospital or doctor's office?N/A When did it last happen? N/A If all above answers are NO, may proceed with cephalosporin use.     Social History   Socioeconomic History   Marital status: Single    Spouse name: Not on file   Number of children: Not on file   Years of education: Not on file   Highest education level: Not on file  Occupational History   Not on file  Tobacco Use   Smoking status: Never   Smokeless tobacco: Never  Vaping Use   Vaping status: Never Used  Substance and Sexual  Activity   Alcohol use: Not Currently   Drug use: Not Currently    Types: Marijuana    Comment: Smoked for 30 years   Sexual activity: Not on file  Other Topics Concern   Not on file  Social History Narrative   Not on file   Social Drivers of Health   Financial Resource Strain: Medium Risk (11/15/2020)   Overall Financial Resource Strain (CARDIA)    Difficulty of Paying Living Expenses: Somewhat hard  Food Insecurity: No Food Insecurity (03/08/2022)   Hunger Vital Sign    Worried About Running Out of Food in the Last Year: Never true    Ran Out of Food in the Last Year: Never true  Transportation Needs: Unmet Transportation Needs (04/21/2022)   PRAPARE - Administrator, Civil Service (Medical): Yes    Lack of Transportation (Non-Medical): Yes  Physical Activity: Not on file  Stress: Not on file  Social Connections: Not on file  Intimate Partner Violence: Not At Risk (03/08/2022)   Humiliation, Afraid, Rape, and Kick questionnaire    Fear of Current or Ex-Partner: No    Emotionally Abused: No    Physically Abused: No    Sexually Abused: No    Physical Exam      Future Appointments  Date Time Provider Department Center  05/04/2023  1:30 PM [facility] PA/NP [facility] None

## 2023-04-14 DIAGNOSIS — R32 Unspecified urinary incontinence: Secondary | ICD-10-CM | POA: Diagnosis not present

## 2023-04-15 NOTE — Addendum Note (Signed)
 Addended by: Dontez Hauss, Milagros Reap on: 04/15/2023 03:00 PM   Modules accepted: Orders

## 2023-04-15 NOTE — Telephone Encounter (Signed)
 Per DPR Detailed message left on voicemail   Message to paramed a FYI

## 2023-04-20 DIAGNOSIS — F1910 Other psychoactive substance abuse, uncomplicated: Secondary | ICD-10-CM | POA: Diagnosis not present

## 2023-04-20 DIAGNOSIS — F332 Major depressive disorder, recurrent severe without psychotic features: Secondary | ICD-10-CM | POA: Diagnosis not present

## 2023-04-20 DIAGNOSIS — F1120 Opioid dependence, uncomplicated: Secondary | ICD-10-CM | POA: Diagnosis not present

## 2023-04-20 DIAGNOSIS — F411 Generalized anxiety disorder: Secondary | ICD-10-CM | POA: Diagnosis not present

## 2023-04-26 ENCOUNTER — Telehealth (HOSPITAL_COMMUNITY): Payer: Self-pay

## 2023-04-26 NOTE — Telephone Encounter (Signed)
 [place] contacted me reporting he has not been feeling well with GI upset with vomiting and diarrhea over the weekend. He called EMS on Saturday at 0400- he did not go to the ER as they would not take him to [facility] at his request but I informed him that due to weather [place] due [place] policy. He reports feeling bad today but no vomiting since yesterday.   He reports he has not eaten or drank much or taken his meds since then- he was skeptical about taking his meds. I plan to see him tomorrow at 0900. He agreed with this plan.    Powell Mirza, EMT-Paramedic [phone] 04/26/2023

## 2023-04-27 ENCOUNTER — Other Ambulatory Visit (HOSPITAL_COMMUNITY): Payer: Self-pay

## 2023-04-27 NOTE — Progress Notes (Signed)
 Paramedicine Encounter    Patient ID: Charles Gray, male    DOB: 1956-02-19, 68 y.o.   MRN: [id number]   Complaints- residual GI symptoms from Norovirus over the weekend.   Assessment- CAOX4, pale, warm and dry, lower leg swelling at baseline, lungs clear, vitals at baseline.   Compliance with meds- missed the last 4 days of medications.   Pill box filled- two weeks   Refills needed-  -pravastatin  -torsemide    Meds changes since last visit- NONE     Social changes- Sayid wants weekend aide services- I gave him DSS number to reach his medicaid case worker for same.    VISIT SUMMARY**  Arrived for home visit for Charles Gray who reports not feeling well. He had a stomach virus over the weekend and reports he is still experiencing symptoms from same including diarrhea and vomiting. He has had three episodes of incontinence of his bowels during this time- needing assistance in cleaning him self up but struggled doing this alone. He requires an aide to assist with this and with bathing in the home. He is using his walker to move in the home and is moving very slow. He is using Zofran  for the nausea with some relief. He has not eaten or drank much over the last four days and has missed the last four days of medications. I obtained vitals as noted. Cardio mems yesteday was 4 today is 7. He plans to start back taking medications today. I reviewed medications and filled pill box for two weeks. Refills needed will be called in to [facility].   I reviewed diet and med management with [place] and discussed upcoming appointments and importance of attending same.   I plan to see Tyshan at clinic visit next week.   Home visit complete.     BP 118/72   Pulse 71   Resp 16   Wt 286 lb (129.7 kg)   SpO2 97%   BMI 29.90 kg/m  Weight yesterday-- 284lbs Last visit weight-- 295lbs     ACTION: Home visit completed     Patient Care Team: Sabas Norleen PARAS., MD as PCP - General (Family  Medicine) Rolan Ezra RAMAN, MD as PCP - Cardiology (Cardiology) Cathern Andriette DEL, LCSW as Social Worker (Licensed Clinical Social Worker)  Patient Active Problem List   Diagnosis Date Noted   Acute on chronic heart failure with preserved ejection fraction (HFpEF) (HCC) 03/04/2022   Dyslipidemia 07/17/2021   Lymphedema 07/08/2021   Melena    Gastric polyps    UGIB (upper gastrointestinal bleed) 04/06/2021   Acute encephalopathy 12/21/2020   COPD (chronic obstructive pulmonary disease) (HCC) 12/21/2020   Chronic diastolic (congestive) heart failure (HCC) 11/14/2020   Syncope 10/28/2020   CHF (congestive heart failure) (HCC) 10/28/2020   Chronic venous stasis dermatitis of both lower extremities 08/27/2020   Cellulitis of both lower extremities 08/14/2020   Iron  deficiency anemia 08/14/2020   GERD without esophagitis 06/10/2020   Atrial fibrillation with rapid ventricular response (HCC) 03/24/2020   Acute on chronic right heart failure (HCC) 01/27/2020   Acute respiratory failure with hypoxia (HCC)    Fatty liver disease, nonalcoholic    Shock (HCC)    Personal history of DVT (deep vein thrombosis) 12/26/2019   Chronic anticoagulation 12/26/2019   Palpitations 12/26/2019   Paroxysmal atrial fibrillation (HCC) 12/23/2019   Heart failure, systolic, acute (HCC) 08/24/2019   Acute on chronic congestive heart failure (HCC)    Acute on chronic diastolic CHF (congestive heart failure) (  HCC) 08/23/2019   Chest pain 08/23/2019   Lactic acidosis 08/23/2019   History of pulmonary embolism 08/12/2019   Atrial tachycardia (HCC) 08/12/2019   Occasional tremors 08/12/2019   OSA (obstructive sleep apnea) 08/12/2019   AKI (acute kidney injury) (HCC) 08/08/2019   Acute on chronic right-sided heart failure (HCC) 08/06/2019   Acute pulmonary embolism (HCC) 06/08/2019   Normocytic anemia 06/08/2019   Pulmonary emboli (HCC) 06/08/2019   Chronic heart failure with preserved ejection fraction (HCC)  05/30/2019   Class 2 severe obesity due to excess calories with serious comorbidity and body mass index (BMI) of 39.0 to 39.9 in adult [facility]) 05/30/2019   Morbid obesity with BMI of 40.0-44.9, adult (HCC) 05/30/2019   Hyponatremia    Drug induced constipation    Peripheral edema    Hypotension due to drugs    Hypoalbuminemia due to protein-calorie malnutrition (HCC)    Thrombocytopenia (HCC)    Anemia of chronic disease    Cirrhosis of liver without ascites (HCC)    Chronic pain syndrome    Debility 03/13/2019   Portal hypertensive gastropathy (HCC) 03/06/2019   Dyspnea    GIB (gastrointestinal bleeding) 03/04/2019   Mixed hyperlipidemia 01/24/2019   Insomnia 01/24/2019   Hyperlipidemia 01/24/2019   Essential hypertension 01/24/2019   Lumbar disc herniation 01/24/2019    Current Outpatient Medications:    apixaban  (ELIQUIS ) 5 MG TABS tablet, Take 1 tablet (5 mg total) by mouth 2 (two) times daily., Disp: 60 tablet, Rfl: 3   Buprenorphine  HCl-Naloxone  HCl 8-2 MG FILM, Place 1 Film under the tongue in the morning and at bedtime., Disp: , Rfl:    cariprazine (VRAYLAR) 1.5 MG capsule, Take 1.5 mg by mouth daily. Per Dr. Elaine, Disp: , Rfl:    eplerenone  (INSPRA ) 50 MG tablet, Take 2 tablets (100 mg total) by mouth daily., Disp: 60 tablet, Rfl: 11   gabapentin  (NEURONTIN ) 800 MG tablet, Take 1 tablet (800 mg total) by mouth 3 (three) times daily., Disp: 90 tablet, Rfl: 1   lactulose , encephalopathy, (CHRONULAC ) 10 GM/15ML SOLN, Take 30 mLs (20 g total) by mouth 2 (two) times daily., Disp: , Rfl:    metolazone  (ZAROXOLYN ) 2.5 MG tablet, Take 1 tablet (2.5 mg total) by mouth 3 (three) times a week., Disp: 12 tablet, Rfl: 1   metoprolol  succinate (TOPROL -XL) 25 MG 24 hr tablet, TAKE ONE TABLET BY MOUTH AT BEDTIME, Disp: 30 tablet, Rfl: 11   ondansetron  (ZOFRAN -ODT) 4 MG disintegrating tablet, Take 4 mg by mouth every 8 (eight) hours as needed for nausea or vomiting (dissolve orally)., Disp:  , Rfl:    pantoprazole  (PROTONIX ) 40 MG tablet, Take 40 mg by mouth 2 (two) times daily before a meal., Disp: , Rfl:    polyethylene glycol (MIRALAX  / GLYCOLAX ) 17 g packet, Take 17 g by mouth as needed for mild constipation (mix and drink as directed)., Disp: , Rfl:    potassium chloride  (KLOR-CON ) 20 MEQ packet, Take 100 mEq by mouth 3 (three) times daily. Take an extra 40 mEq on days when you take metolazone , Disp: 475 packet, Rfl: 3   pravastatin  (PRAVACHOL ) 20 MG tablet, Take 1 tablet (20 mg total) by mouth daily., Disp: 30 tablet, Rfl: 11   sertraline  (ZOLOFT ) 100 MG tablet, Take 100 mg by mouth daily. At night, Disp: , Rfl:    tamsulosin  (FLOMAX ) 0.4 MG CAPS capsule, Take 1 capsule (0.4 mg total) by mouth daily. Additional refills will need to come from PCP, Disp: 30 capsule,  Rfl: 0   torsemide  (DEMADEX ) 100 MG tablet, TAKE ONE TABLET BY MOUTH DAILY IN THE MORNING AND EVENING, Disp: 90 tablet, Rfl: 3   triamcinolone  (KENALOG ) 0.025 % ointment, Apply 1 application. topically 2 (two) times daily., Disp: 30 g, Rfl: 0   VENTOLIN  HFA 108 (90 Base) MCG/ACT inhaler, Inhale 1 puff into the lungs every 4 (four) hours as needed for shortness of breath. (Patient taking differently: Inhale 2 puffs into the lungs every 4 (four) hours as needed for shortness of breath.), Disp: 6.7 g, Rfl: 1   zolpidem  (AMBIEN ) 10 MG tablet, Take 1 tablet (10 mg total) by mouth at bedtime as needed for sleep., Disp: 30 tablet, Rfl: 0 Allergies  Allergen Reactions   Penicillins Other (See Comments)    Caused convulsions as a child  Did it involve swelling of the face/tongue/throat, SOB, or low BP? N/A Did it involve sudden or severe rash/hives, skin peeling, or any reaction on the inside of your mouth or nose? N/A Did you need to seek medical attention at a hospital or doctor's office?N/A When did it last happen? N/A If all above answers are NO, may proceed with cephalosporin use.     Social History   Socioeconomic  History   Marital status: Single    Spouse name: Not on file   Number of children: Not on file   Years of education: Not on file   Highest education level: Not on file  Occupational History   Not on file  Tobacco Use   Smoking status: Never   Smokeless tobacco: Never  Vaping Use   Vaping status: Never Used  Substance and Sexual Activity   Alcohol use: Not Currently   Drug use: Not Currently    Types: Marijuana    Comment: Smoked for 30 years   Sexual activity: Not on file  Other Topics Concern   Not on file  Social History Narrative   Not on file   Social Drivers of Health   Financial Resource Strain: Medium Risk (11/15/2020)   Overall Financial Resource Strain (CARDIA)    Difficulty of Paying Living Expenses: Somewhat hard  Food Insecurity: No Food Insecurity (03/08/2022)   Hunger Vital Sign    Worried About Running Out of Food in the Last Year: Never true    Ran Out of Food in the Last Year: Never true  Transportation Needs: Unmet Transportation Needs (04/21/2022)   PRAPARE - Administrator, Civil Service (Medical): Yes    Lack of Transportation (Non-Medical): Yes  Physical Activity: Not on file  Stress: Not on file  Social Connections: Not on file  Intimate Partner Violence: Not At Risk (03/08/2022)   Humiliation, Afraid, Rape, and Kick questionnaire    Fear of Current or Ex-Partner: No    Emotionally Abused: No    Physically Abused: No    Sexually Abused: No    Physical Exam      Future Appointments  Date Time Provider Department Center  05/04/2023  1:30 PM [facility] PA/NP [facility] None

## 2023-05-04 ENCOUNTER — Encounter (HOSPITAL_COMMUNITY): Payer: Medicare HMO

## 2023-05-11 ENCOUNTER — Other Ambulatory Visit (HOSPITAL_COMMUNITY): Payer: Self-pay

## 2023-05-11 DIAGNOSIS — F411 Generalized anxiety disorder: Secondary | ICD-10-CM | POA: Diagnosis not present

## 2023-05-11 DIAGNOSIS — F1910 Other psychoactive substance abuse, uncomplicated: Secondary | ICD-10-CM | POA: Diagnosis not present

## 2023-05-11 DIAGNOSIS — F1120 Opioid dependence, uncomplicated: Secondary | ICD-10-CM | POA: Diagnosis not present

## 2023-05-11 DIAGNOSIS — F332 Major depressive disorder, recurrent severe without psychotic features: Secondary | ICD-10-CM | POA: Diagnosis not present

## 2023-05-11 NOTE — Progress Notes (Signed)
Paramedicine Encounter    Patient ID: Charles Gray, male    DOB: Jun 08, 1955, 68 y.o.   MRN: [id number]   Complaints- short of breath, chronic fatigue, lower leg swelling, right axillary side swelling.   Assessment- CAOX4, warm and dry seated in recliner, not apparently short of breath, bilateral lower leg swelling and edema, lungs clear, swelling to right latissimus dorsi area, vitals at baseline- CARDIO MEMS 6 TODAY   Compliance with meds- SKIPPING ZOLOFT and some metolazone (due to recent diarrhea and vomiting)   Pill box filled- for two weeks   Refills needed- vraylar, metoprolol, zoloft   Meds changes since last visit- none     Social changes- none    VISIT SUMMARY-  Arrived for home visit for [facility] where he was sitting in his recliner CAOX4, warm and dry in no apparent distress. He reports he has been having shortness of breath, fatigue and soreness all over. He reports he is mostly sitting in his chair with limited movement other than to the bathroom or kitchen. He has been taking his medications but says he is trying to "wean" himself off of his Zoloft. I advised him the risks of doing this without a doctors supervision and he agreed to continue taking it at bedtime instead of morning. I reviewed meds and filled pill box for two weeks. Cardio mems was reviewed. He has lower leg edema a little more increased than his baseline but he has not been wearing his compression socks. He says last week he wore them for three days straight and never took them off so his legs are sore- I reminded him that he is to only wear them for 8 hours at a time and to never wear them to bed. He verbalized understanding.   He says he feels like he isn't getting any better and doesn't know how to feel better. I explained the issue with him being mostly sedentary and how this does not help and how in order to address his concerns he must keep his follow ups in the clinic and with his other doctors. He  verbalized understanding and agreed. I advised him to keep a low sodium diet, adhere to med compliance and to his fluid intake restrictions. He said he would try.   I will plan to see Mohammed in two weeks.  Visit complete.   BP 120/76   Pulse 75   Resp 16   Wt 288 lb (130.6 kg)   SpO2 96%   BMI 30.11 kg/m  Weight yesterday-- did not weigh  Last visit weight-- 286  Weight today- 288       ACTION: Home visit completed     Patient Care Team: Nonnie Done., MD as PCP - General (Family Medicine) Laurey Morale, MD as PCP - Cardiology (Cardiology) [name], LCSW as Social Worker (Licensed Clinical Social Worker)  Patient Active Problem List   Diagnosis Date Noted   Acute on chronic heart failure with preserved ejection fraction (HFpEF) (HCC) 03/04/2022   Dyslipidemia 07/17/2021   Lymphedema 07/08/2021   Melena    Gastric polyps    UGIB (upper gastrointestinal bleed) 04/06/2021   Acute encephalopathy 12/21/2020   COPD (chronic obstructive pulmonary disease) (HCC) 12/21/2020   Chronic diastolic (congestive) heart failure (HCC) 11/14/2020   Syncope 10/28/2020   CHF (congestive heart failure) (HCC) 10/28/2020   Chronic venous stasis dermatitis of both lower extremities 08/27/2020   Cellulitis of both lower extremities 08/14/2020   Iron deficiency anemia 08/14/2020  GERD without esophagitis 06/10/2020   Atrial fibrillation with rapid ventricular response (HCC) 03/24/2020   Acute on chronic right heart failure (HCC) 01/27/2020   Acute respiratory failure with hypoxia (HCC)    Fatty liver disease, nonalcoholic    Shock (HCC)    Personal history of DVT (deep vein thrombosis) 12/26/2019   Chronic anticoagulation 12/26/2019   Palpitations 12/26/2019   Paroxysmal atrial fibrillation (HCC) 12/23/2019   Heart failure, systolic, acute (HCC) 08/24/2019   Acute on chronic congestive heart failure (HCC)    Acute on chronic diastolic CHF (congestive heart failure) (HCC)  08/23/2019   Chest pain 08/23/2019   Lactic acidosis 08/23/2019   History of pulmonary embolism 08/12/2019   Atrial tachycardia (HCC) 08/12/2019   Occasional tremors 08/12/2019   OSA (obstructive sleep apnea) 08/12/2019   AKI (acute kidney injury) (HCC) 08/08/2019   Acute on chronic right-sided heart failure (HCC) 08/06/2019   Acute pulmonary embolism (HCC) 06/08/2019   Normocytic anemia 06/08/2019   Pulmonary emboli (HCC) 06/08/2019   Chronic heart failure with preserved ejection fraction (HCC) 05/30/2019   Class 2 severe obesity due to excess calories with serious comorbidity and body mass index (BMI) of 39.0 to 39.9 in adult (HCC) 05/30/2019   Morbid obesity with BMI of 40.0-44.9, adult (HCC) 05/30/2019   Hyponatremia    Drug induced constipation    Peripheral edema    Hypotension due to drugs    Hypoalbuminemia due to protein-calorie malnutrition (HCC)    Thrombocytopenia (HCC)    Anemia of chronic disease    Cirrhosis of liver without ascites (HCC)    Chronic pain syndrome    Debility 03/13/2019   Portal hypertensive gastropathy (HCC) 03/06/2019   Dyspnea    GIB (gastrointestinal bleeding) 03/04/2019   Mixed hyperlipidemia 01/24/2019   Insomnia 01/24/2019   Hyperlipidemia 01/24/2019   Essential hypertension 01/24/2019   Lumbar disc herniation 01/24/2019    Current Outpatient Medications:    apixaban (ELIQUIS) 5 MG TABS tablet, Take 1 tablet (5 mg total) by mouth 2 (two) times daily., Disp: 60 tablet, Rfl: 3   Buprenorphine HCl-Naloxone HCl 8-2 MG FILM, Place 1 Film under the tongue in the morning and at bedtime., Disp: , Rfl:    cariprazine (VRAYLAR) 1.5 MG capsule, Take 1.5 mg by mouth daily. Per Dr. Orvan Falconer, Disp: , Rfl:    eplerenone (INSPRA) 50 MG tablet, Take 2 tablets (100 mg total) by mouth daily., Disp: 60 tablet, Rfl: 11   gabapentin (NEURONTIN) 800 MG tablet, Take 1 tablet (800 mg total) by mouth 3 (three) times daily., Disp: 90 tablet, Rfl: 1   lactulose,  encephalopathy, (CHRONULAC) 10 GM/15ML SOLN, Take 30 mLs (20 g total) by mouth 2 (two) times daily., Disp: , Rfl:    metolazone (ZAROXOLYN) 2.5 MG tablet, Take 1 tablet (2.5 mg total) by mouth 3 (three) times a week., Disp: 12 tablet, Rfl: 1   metoprolol succinate (TOPROL-XL) 25 MG 24 hr tablet, TAKE ONE TABLET BY MOUTH AT BEDTIME, Disp: 30 tablet, Rfl: 11   ondansetron (ZOFRAN-ODT) 4 MG disintegrating tablet, Take 4 mg by mouth every 8 (eight) hours as needed for nausea or vomiting (dissolve orally)., Disp: , Rfl:    pantoprazole (PROTONIX) 40 MG tablet, Take 40 mg by mouth 2 (two) times daily before a meal., Disp: , Rfl:    polyethylene glycol (MIRALAX / GLYCOLAX) 17 g packet, Take 17 g by mouth as needed for mild constipation (mix and drink as directed)., Disp: , Rfl:  potassium chloride (KLOR-CON) 20 MEQ packet, Take 100 mEq by mouth 3 (three) times daily. Take an extra 40 mEq on days when you take metolazone, Disp: 475 packet, Rfl: 3   pravastatin (PRAVACHOL) 20 MG tablet, Take 1 tablet (20 mg total) by mouth daily., Disp: 30 tablet, Rfl: 11   sertraline (ZOLOFT) 100 MG tablet, Take 100 mg by mouth daily. At night, Disp: , Rfl:    tamsulosin (FLOMAX) 0.4 MG CAPS capsule, Take 1 capsule (0.4 mg total) by mouth daily. Additional refills will need to come from PCP, Disp: 30 capsule, Rfl: 0   torsemide (DEMADEX) 100 MG tablet, TAKE ONE TABLET BY MOUTH DAILY IN THE MORNING AND EVENING, Disp: 90 tablet, Rfl: 3   triamcinolone (KENALOG) 0.025 % ointment, Apply 1 application. topically 2 (two) times daily., Disp: 30 g, Rfl: 0   VENTOLIN HFA 108 (90 Base) MCG/ACT inhaler, Inhale 1 puff into the lungs every 4 (four) hours as needed for shortness of breath. (Patient taking differently: Inhale 2 puffs into the lungs every 4 (four) hours as needed for shortness of breath.), Disp: 6.7 g, Rfl: 1   zolpidem (AMBIEN) 10 MG tablet, Take 1 tablet (10 mg total) by mouth at bedtime as needed for sleep., Disp: 30  tablet, Rfl: 0 Allergies  Allergen Reactions   Penicillins Other (See Comments)    Caused convulsions as a child  Did it involve swelling of the face/tongue/throat, SOB, or low BP? N/A Did it involve sudden or severe rash/hives, skin peeling, or any reaction on the inside of your mouth or nose? N/A Did you need to seek medical attention at a hospital or doctor's office?N/A When did it last happen? N/A If all above answers are "NO", may proceed with cephalosporin use.     Social History   Socioeconomic History   Marital status: Single    Spouse name: Not on file   Number of children: Not on file   Years of education: Not on file   Highest education level: Not on file  Occupational History   Not on file  Tobacco Use   Smoking status: Never   Smokeless tobacco: Never  Vaping Use   Vaping status: Never Used  Substance and Sexual Activity   Alcohol use: Not Currently   Drug use: Not Currently    Types: Marijuana    Comment: Smoked for 30 years   Sexual activity: Not on file  Other Topics Concern   Not on file  Social History Narrative   Not on file   Social Drivers of Health   Financial Resource Strain: Medium Risk (11/15/2020)   Overall Financial Resource Strain (CARDIA)    Difficulty of Paying Living Expenses: Somewhat hard  Food Insecurity: No Food Insecurity (03/08/2022)   Hunger Vital Sign    Worried About Running Out of Food in the Last Year: Never true    Ran Out of Food in the Last Year: Never true  Transportation Needs: Unmet Transportation Needs (04/21/2022)   PRAPARE - Administrator, Civil Service (Medical): Yes    Lack of Transportation (Non-Medical): Yes  Physical Activity: Not on file  Stress: Not on file  Social Connections: Not on file  Intimate Partner Violence: Not At Risk (03/08/2022)   Humiliation, Afraid, Rape, and Kick questionnaire    Fear of Current or Ex-Partner: No    Emotionally Abused: No    Physically Abused: No    Sexually  Abused: No    Physical Exam  Future Appointments  Date Time Provider Department Center  05/21/2023  2:00 PM [facility] PA/NP [facility] None

## 2023-05-12 ENCOUNTER — Telehealth (HOSPITAL_COMMUNITY): Payer: Self-pay

## 2023-05-12 NOTE — Telephone Encounter (Signed)
Contacted Mattis's PCP at his request to schedule a follow up in regards to concerns with his urine incontinence, dark colored urine and foul smelling urine with some flank pain intermittently. I got an appointment for Wednesday Feb. 5th at 11:00.   I notified Barton and he confirmed he will work on his transportation.   Maralyn Sago, EMT-Paramedic [phone] 05/12/2023

## 2023-05-17 DIAGNOSIS — R32 Unspecified urinary incontinence: Secondary | ICD-10-CM | POA: Diagnosis not present

## 2023-05-18 ENCOUNTER — Telehealth (HOSPITAL_COMMUNITY): Payer: Self-pay

## 2023-05-18 NOTE — Telephone Encounter (Signed)
Medications called into [facility]: -vraylar -metoprolol -zoloft    Maralyn Sago, EMT-Paramedic [phone] 05/18/2023

## 2023-05-20 ENCOUNTER — Telehealth (HOSPITAL_COMMUNITY): Payer: Self-pay

## 2023-05-20 NOTE — Telephone Encounter (Signed)
 Called and left patient a voice message  to confirm/remind patient of their appointment at [facility] on 05/21/23.   And to bring in all medications and/or complete list.

## 2023-05-21 ENCOUNTER — Encounter (HOSPITAL_COMMUNITY): Payer: Self-pay

## 2023-05-21 ENCOUNTER — Ambulatory Visit (HOSPITAL_COMMUNITY)
Admission: RE | Admit: 2023-05-21 | Discharge: 2023-05-21 | Disposition: A | Discharge status: Home / Self Care | Payer: Medicare HMO | Source: Ambulatory Visit | Attending: Family Medicine | Admitting: Family Medicine

## 2023-05-21 VITALS — BP 126/72 | HR 76 | Wt 286.4 lb

## 2023-05-21 DIAGNOSIS — E669 Obesity, unspecified: Secondary | ICD-10-CM | POA: Diagnosis not present

## 2023-05-21 DIAGNOSIS — Z79899 Other long term (current) drug therapy: Secondary | ICD-10-CM | POA: Diagnosis not present

## 2023-05-21 DIAGNOSIS — Z5982 Transportation insecurity: Secondary | ICD-10-CM | POA: Insufficient documentation

## 2023-05-21 DIAGNOSIS — K766 Portal hypertension: Secondary | ICD-10-CM | POA: Insufficient documentation

## 2023-05-21 DIAGNOSIS — I110 Hypertensive heart disease with heart failure: Secondary | ICD-10-CM | POA: Insufficient documentation

## 2023-05-21 DIAGNOSIS — I5032 Chronic diastolic (congestive) heart failure: Secondary | ICD-10-CM

## 2023-05-21 DIAGNOSIS — K7460 Unspecified cirrhosis of liver: Secondary | ICD-10-CM | POA: Diagnosis not present

## 2023-05-21 DIAGNOSIS — J449 Chronic obstructive pulmonary disease, unspecified: Secondary | ICD-10-CM | POA: Diagnosis not present

## 2023-05-21 DIAGNOSIS — F32A Depression, unspecified: Secondary | ICD-10-CM | POA: Diagnosis not present

## 2023-05-21 DIAGNOSIS — I890 Lymphedema, not elsewhere classified: Secondary | ICD-10-CM | POA: Insufficient documentation

## 2023-05-21 DIAGNOSIS — Z7901 Long term (current) use of anticoagulants: Secondary | ICD-10-CM | POA: Insufficient documentation

## 2023-05-21 DIAGNOSIS — Z86711 Personal history of pulmonary embolism: Secondary | ICD-10-CM | POA: Diagnosis not present

## 2023-05-21 DIAGNOSIS — Z86718 Personal history of other venous thrombosis and embolism: Secondary | ICD-10-CM | POA: Diagnosis not present

## 2023-05-21 DIAGNOSIS — G4733 Obstructive sleep apnea (adult) (pediatric): Secondary | ICD-10-CM

## 2023-05-21 DIAGNOSIS — Z5986 Financial insecurity: Secondary | ICD-10-CM | POA: Diagnosis not present

## 2023-05-21 DIAGNOSIS — F419 Anxiety disorder, unspecified: Secondary | ICD-10-CM | POA: Insufficient documentation

## 2023-05-21 DIAGNOSIS — K5909 Other constipation: Secondary | ICD-10-CM | POA: Diagnosis not present

## 2023-05-21 DIAGNOSIS — I480 Paroxysmal atrial fibrillation: Secondary | ICD-10-CM

## 2023-05-21 LAB — CBC
HCT: 34.9 % — ABNORMAL LOW (ref 39.0–52.0)
Hemoglobin: 10.6 g/dL — ABNORMAL LOW (ref 13.0–17.0)
MCH: 22.1 pg — ABNORMAL LOW (ref 26.0–34.0)
MCHC: 30.4 g/dL (ref 30.0–36.0)
MCV: 72.7 fL — ABNORMAL LOW (ref 80.0–100.0)
Platelets: 183 10*3/uL (ref 150–400)
RBC: 4.8 MIL/uL (ref 4.22–5.81)
RDW: 16.7 % — ABNORMAL HIGH (ref 11.5–15.5)
WBC: 7.9 10*3/uL (ref 4.0–10.5)
nRBC: 0 % (ref 0.0–0.2)

## 2023-05-21 LAB — BASIC METABOLIC PANEL
Anion gap: 15 (ref 5–15)
BUN: 16 mg/dL (ref 8–23)
CO2: 32 mmol/L (ref 22–32)
Calcium: 9.6 mg/dL (ref 8.9–10.3)
Chloride: 91 mmol/L — ABNORMAL LOW (ref 98–111)
Creatinine, Ser: 1.13 mg/dL (ref 0.61–1.24)
GFR, Estimated: >60 mL/min (ref >60)
Glucose, Bld: 115 mg/dL — ABNORMAL HIGH (ref 70–99)
Potassium: 3.3 mmol/L — ABNORMAL LOW (ref 3.5–5.1)
Sodium: 138 mmol/L (ref 135–145)

## 2023-05-21 LAB — BRAIN NATRIURETIC PEPTIDE: B Natriuretic Peptide: 33.7 pg/mL (ref 0.0–100.0)

## 2023-05-21 NOTE — Patient Instructions (Addendum)
 Thank you for coming in today  If you had labs drawn today, any labs that are abnormal the clinic will call you No news is good news  Medications: No changes  A Order for [employer] the wound center will contact you for further details   Follow up appointments:  Your physician recommends that you schedule a follow-up appointment in:  4 months With Dr. Mclean  You will receive a reminder letter in the mail a few months in advance. If you don't receive a letter, please call our office to schedule the follow-up appointment.    Do the following things EVERYDAY: Weigh yourself in the morning before breakfast. Write it down and keep it in a log. Take your medicines as prescribed Eat low salt foods--Limit salt (sodium) to 2000 mg per day.  Stay as active as you can everyday Limit all fluids for the day to less than 2 liters   At the [facility], you and your health needs are our priority. As part of our continuing mission to provide you with exceptional heart care, we have created designated Provider Care Teams. These Care Teams include your primary Cardiologist (physician) and Advanced Practice Providers (APPs- Physician Assistants and Nurse Practitioners) who all work together to provide you with the care you need, when you need it.   You may see any of the following providers on your designated Care Team at your next follow up: Dr Toribio Fuel Dr Ezra Shuck Dr. Ria Gardenia Greig Lenetta, NP Caffie Shed, [place] [facility] [place], [place] Beckey Coe, NP Tinnie Redman, PharmD   Please be sure to bring in all your medications bottles to every appointment.    Thank you for choosing [facility]  If you have any questions or concerns before your next appointment please send us  a message through [place] or call our office at [phone].    TO LEAVE A MESSAGE FOR THE NURSE SELECT OPTION 2, PLEASE LEAVE A  MESSAGE INCLUDING: YOUR NAME DATE OF BIRTH CALL BACK NUMBER REASON FOR CALL**this is important as we prioritize the call backs  YOU WILL RECEIVE A CALL BACK THE SAME DAY AS LONG AS YOU CALL BEFORE 4:00 PM

## 2023-05-21 NOTE — Progress Notes (Addendum)
 ADVANCED HF CLINIC NOTE  PCP: Sabas Norleen PARAS., MD Primary Cardiologist: Dr. Dub Huntsman, DO  HF Cardiology: Dr. Rolan  HPI: 68 y.o. 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    He had multiple hospitalizations in 2021 for CHF exacerbation, requiring IV Lasix  and change in PO regimen from lasix  to torsemide . Echo 4/21 showed normal LVEF 55-60% w/ G1DD. RV not well visualized, but felt to have component of RV failure. RHC 4/21 showed normal PA pressure and normal cardiac output. PFTs also completed and c/w mild obstructive airway disease.   In 7/21, he had a coronary CTA.  This showed no coronary calcium but there was a concern for soft plaque in the distal LCx and distal RCA with hemodynamically significant stenosis.  He was managed medically. Had not had any chest pain, but was started on ranolazine  after his CTA.  Made his  head fuzzy.    Admitted with A/C diastolic heart failure 01/2020. Diuresed with IV lasix  and transitioned to bumex  3 mg twice a day. Discharge weight 360 pounds.   He was admitted again in 12/21 with CHF.  Echo 12/21 showed EF 60-65%, GIIDD, RV poorly visualized.   Admitted 06/09/20 with A/C diastolic heart failure. Diuresed with IV lasix  + metolazone . Transitioned to torsemide  60 mg twice a day and metolazone  twice a day. D/C weight 364 pounds.   Admitted 5/28//22 with cellulitis. Treated with antibiotics.   Admitted 09/20/20 with  A/C CHF. He diuresed 5 L with IV lasix , weight 371 at discharge.  He was readmitted for A/C CHF 7/22. He was diuresed with IV lasix  + metolazone . RHC showed  optimized filling pressures and preserved CO/CI. He was discharged on torsemide  100 mg bid + metolazone  M/W/F. Discharge weight 374 lbs.  RHC (8/22) showed normal filling pressures & he underwent Cardiomems placement.  Admitted 9/10-9/12/22 for acute encephalopathy, felt secondary  to polypharmacy. Ambien  and gabapentin  were briefly held. Ammonia mildly elevated and he was started on lactulose . Mentation returned to baseline and he was discharged home with Home Health PT/OT.  Saw ID for suspected cellulitis. No active infection identified. Referral to lymphedema clinic submitted.   Admitted 3/23 with a/c dHF and worsening LE edema. Echo showed EF 60-65% RV normal this admission. He was diuresed with IV Lasix , down 20 lbs. LE venous dopplers negative DVT. Hospitalization complicated by severe  hypokalemia, discharged home on KCL 100 tid, weight 377.7 lbs. Cardiomems goal lowered to 9.  Admitted 11/23 with CHF exacerbation, weight up to 407 lbs, despite normal PADP readings. He was diuresed with IV lasix  and metolazone .  Echo showed EF 55-60%, RV not well-visualized. Discharged home, weight 385 lbs.  Today he returns for HF follow up with his son. Overall feeling fine. He has SOB walking short distances on flat ground. Uses a walker to get around his home. Feels palpitations. Continues with chronic lower extremity edema. Denies abnormal bleeding, CP, dizziness, or PND/Orthopnea. Appetite ok. No fever or chills. Weight at home 286 pounds. Taking all medications. Followed by [facility]. Has not had unna boots in a couple months. Struggles with urinary incontinence.  Cardiomems (personally reviewed from 05/20/23): PADP 11  (goal 9).   ECG (personally reviewed): NSR 91 bpm  PMH: 1. HTN 2. OSA 3. Hyperlipidemia 4. Low back pain 5. PE/DVT in 2/21.  - V/Q scan in 6/21 was not suggestive of chronic PE.  6. Nonalcoholic steatohepatitis.  7. GI  bleed in 10/20.  8. Anxiety 9. CAD: Coronary CTA in 7/21 with calcium score 0 but concern for soft plaque distal CFX and RCA.  By [facility], there was flow-limiting stenosis in distal CFX and distal RCA.  10. Chronic diastolic CHF: Echo in 4/21 with EF 55-60%, RV poorly visualized.  - RHC (4/21): mean RA 8, PA 20/8, mean PCWP 11, CI 2.8 - Echo  (12/21): EF 60-65%, grade 2 diastolic dysfunction, RV poorly visualized.   - RHC (8/22) normal filling pressures, RA mean 7, RV 29/9, PA 32/15, mean 22, PCWP mean 13, CO/CI 7.32/2.41. - Cardiomems (8/22) placement. - Echo (3/23): EF 60-65%, mild LVH, normal RV - Echo (11/23): EF 55-60%, RV not well-visualized.  11. PFTs (4/21): Mild obstruction.  12. ABIs (10/21): Normal. 13. Fe deficiency anemia.   Review of Systems: All systems reviewed and negative except as per HPI.   Current Outpatient Medications  Medication Sig Dispense Refill   apixaban  (ELIQUIS ) 5 MG TABS tablet Take 1 tablet (5 mg total) by mouth 2 (two) times daily. 60 tablet 3   Buprenorphine  HCl-Naloxone  HCl 8-2 MG FILM Place 1 Film under the tongue in the morning and at bedtime.     cariprazine (VRAYLAR) 1.5 MG capsule Take 1.5 mg by mouth daily. Per Dr. Elaine     eplerenone  (INSPRA ) 50 MG tablet Take 2 tablets (100 mg total) by mouth daily. 60 tablet 11   gabapentin  (NEURONTIN ) 800 MG tablet Take 1 tablet (800 mg total) by mouth 3 (three) times daily. 90 tablet 1   lactulose , encephalopathy, (CHRONULAC ) 10 GM/15ML SOLN Take 30 mLs (20 g total) by mouth 2 (two) times daily. (Patient taking differently: Take 15 g by mouth 2 (two) times daily.)     metolazone  (ZAROXOLYN ) 2.5 MG tablet Take 1 tablet (2.5 mg total) by mouth 3 (three) times a week. (Patient taking differently: Take 2.5 mg by mouth 3 (three) times a week. on Mondays Wednesdays and Fridays) 12 tablet 1   metoprolol  succinate (TOPROL -XL) 25 MG 24 hr tablet TAKE ONE TABLET BY MOUTH AT BEDTIME 30 tablet 11   ondansetron  (ZOFRAN -ODT) 4 MG disintegrating tablet Take 4 mg by mouth every 8 (eight) hours as needed for nausea or vomiting (dissolve orally).     pantoprazole  (PROTONIX ) 40 MG tablet Take 40 mg by mouth 2 (two) times daily before a meal.     polyethylene glycol (MIRALAX  / GLYCOLAX ) 17 g packet Take 17 g by mouth as needed for mild constipation (mix and drink as  directed).     potassium chloride  (KLOR-CON ) 20 MEQ packet Take 100 mEq by mouth 3 (three) times daily. Take an extra 40 mEq on days when you take metolazone  475 packet 3   pravastatin  (PRAVACHOL ) 20 MG tablet Take 1 tablet (20 mg total) by mouth daily. 30 tablet 11   sertraline  (ZOLOFT ) 100 MG tablet Take 100 mg by mouth daily. At night     tamsulosin  (FLOMAX ) 0.4 MG CAPS capsule Take 1 capsule (0.4 mg total) by mouth daily. Additional refills will need to come from PCP 30 capsule 0   torsemide  (DEMADEX ) 100 MG tablet TAKE ONE TABLET BY MOUTH DAILY IN THE MORNING AND EVENING 90 tablet 3   triamcinolone  (KENALOG ) 0.025 % ointment Apply 1 application. topically 2 (two) times daily. 30 g 0   VENTOLIN  HFA 108 (90 Base) MCG/ACT inhaler Inhale 1 puff into the lungs every 4 (four) hours as needed for shortness of breath. (Patient taking differently: Inhale 2  puffs into the lungs every 4 (four) hours as needed for shortness of breath.) 6.7 g 1   zolpidem  (AMBIEN ) 10 MG tablet Take 1 tablet (10 mg total) by mouth at bedtime as needed for sleep. 30 tablet 0   No current facility-administered medications for this encounter.   Allergies  Allergen Reactions   Penicillins Other (See Comments)    Caused convulsions as a child  Did it involve swelling of the face/tongue/throat, SOB, or low BP? N/A Did it involve sudden or severe rash/hives, skin peeling, or any reaction on the inside of your mouth or nose? N/A Did you need to seek medical attention at a hospital or doctor's office?N/A When did it last happen? N/A If all above answers are NO, may proceed with cephalosporin use.   Social History   Socioeconomic History   Marital status: Single    Spouse name: Not on file   Number of children: Not on file   Years of education: Not on file   Highest education level: Not on file  Occupational History   Not on file  Tobacco Use   Smoking status: Never   Smokeless tobacco: Never  Vaping Use   Vaping  status: Never Used  Substance and Sexual Activity   Alcohol use: Not Currently   Drug use: Not Currently    Types: Marijuana    Comment: Smoked for 30 years   Sexual activity: Not on file  Other Topics Concern   Not on file  Social History Narrative   Not on file   Social Drivers of Health   Financial Resource Strain: Medium Risk (11/15/2020)   Overall Financial Resource Strain (CARDIA)    Difficulty of Paying Living Expenses: Somewhat hard  Food Insecurity: No Food Insecurity (03/08/2022)   Hunger Vital Sign    Worried About Running Out of Food in the Last Year: Never true    Ran Out of Food in the Last Year: Never true  Transportation Needs: Unmet Transportation Needs (04/21/2022)   PRAPARE - Administrator, Civil Service (Medical): Yes    Lack of Transportation (Non-Medical): Yes  Physical Activity: Not on file  Stress: Not on file  Social Connections: Not on file  Intimate Partner Violence: Not At Risk (03/08/2022)   Humiliation, Afraid, Rape, and Kick questionnaire    Fear of Current or Ex-Partner: No    Emotionally Abused: No    Physically Abused: No    Sexually Abused: No    Family History  Problem Relation Age of Onset   Hyperlipidemia Mother    Hypertension Mother    Stroke Mother    CAD Maternal Grandmother    CAD Maternal Grandfather    BP 126/72   Pulse 76   Wt 129.9 kg (286 lb 6.4 oz)   SpO2 96%   BMI 29.95 kg/m   Wt Readings from Last 3 Encounters:  05/21/23 129.9 kg (286 lb 6.4 oz)  05/11/23 130.6 kg (288 lb)  04/27/23 129.7 kg (286 lb)   PHYSICAL EXAM: General:  NAD. No resp difficulty, arrived in [facility], chronically-ill appearing HEENT: Normal Neck: Supple. No JVD. Cor: Regular rate & rhythm. No rubs, gallops or murmurs. Lungs: Clear Abdomen: Soft, obese, nontender, nondistended.  Extremities: No cyanosis, clubbing, rash, 2-3+ BLE chronic edema, + weeping Neuro: Alert & oriented x 3, moves all 4 extremities w/o difficulty. Affect  pleasant.  ASSESSMENT & PLAN: 1. Chronic diastolic CHF with suspected RV dysfunction: Echo 7/22 with EF 60-65%, mild LVH,  normal RV, unable to estimate PA systolic pressure. Multiple CHF admissions despite aggressive outpatient diuretic regimen and paramedicine.  RHC (7/22) with optimized filling pressures, PCWP 13, CO/CI 7.3/2.4. RHC (8/22) with normal filling pressures; underwent placement of Cardiomems (dPAP goal 9 mmHg).  Echo 11/23 with EF 55-60%, RV not well-visualized. He has lost 60 lbs since last visit.  Chronic NYHA III symptoms, functional class confounded by body habitus and chronic lymphedema. He is not volume overloaded by Cardiomems and no JVD noted. Significant venous insufficiency.  - Will see if we can arrange unna boots.  - Continue torsemide  100 mg bid + metolazone  2.5 mg/extra 40 KCL 3 days/week. BMET today. - He struggles with urinary incontinence and needs urinary supplies throughout the day in order to be compliant with his diuretic regimen. - Continue 100 KCL tid. - Continue eplerenone  100 mg daily.  - Continue Toprol  XL 25 mg qhs.  - GI upset with Jardiance  and Farxiga .  2. H/o PE/DVT: Suspect due to sedentary lifestyle.  Occurred in 2/21.  V/Q scan in 6/21 did not show evidence for chronic PE.   - Continue Eliquis . No bleeding issues. CBC today. 3. OSA: Cannot tolerate CPAP. - No change. 4. Anxiety/depression: This is a major issue.   - Continue sertraline  100 mg daily.  5. Hypertension: BP well-controlled - Continue current BP active meds 6. Atrial fibrillation: Paroxysmal. NSR today.  Rare symptoms.   - Continue apixaban .  - Continue Toprol  XL. 7. Cirrhosis: CT scan in past noted nodular contour of liver, compatible with underlying cirrhosis. In setting of known RV dysfunction and suspect cardiogenic. LFTs ok. EGD 12/22 showed portal hypertensive gastropathy, no varices.  - On lactulose  (also takes this for chronic constipation)    Continue paramedicine,  appreciate their help.   Follow up in 4 months with Dr. Rolan Raisin [facility] FNP-BC 05/21/23

## 2023-05-24 ENCOUNTER — Telehealth (HOSPITAL_COMMUNITY): Payer: Self-pay

## 2023-05-24 NOTE — Telephone Encounter (Signed)
 [id number] to patient and he is taking everything correctly. Per pt's request he wants his labs drawn at his Doctors' appointment on Friday 05/28/23 at Dr. Dominica Friends office. In addition, the B[facility] order rx has been faxed to their office. Pt's lab results will be faxed to our office. Crystal CMA, will fax our office the results. Pt aware, agreeable, and verbalized understanding.

## 2023-05-24 NOTE — Telephone Encounter (Signed)
-----   Message from Elmarie Hacking sent at 05/21/2023  4:24 PM EST ----- Charles Gray is low. Please ensure he is taking 100 KCL tid, with extra 40 KCL on metolazone  days (3 days a week).  Repeat BMET in 1 week

## 2023-05-25 ENCOUNTER — Telehealth (HOSPITAL_COMMUNITY): Payer: Self-pay

## 2023-05-25 ENCOUNTER — Other Ambulatory Visit (HOSPITAL_COMMUNITY): Payer: Self-pay

## 2023-05-25 NOTE — Progress Notes (Signed)
Paramedicine Encounter    Patient ID: Charles Gray, male    DOB: [birth year], 68 y.o.   MRN: [id number]   Complaints- Lower leg swelling/redness/sore   Assessment- CAOX4, warm and dry, ambulating with walker in kitchen with no shortness of breath, lower leg swelling with redness, weight up 6lbs from yesterday- claims he took his metolazone, lungs clear, vitals as noted.   Compliance with meds- claims no missed doses   Pill box filled- for two weeks   Refills needed- none   Meds changes since last visit- none     Social changes- none    VISIT SUMMARY- Arrived for home visit for Charles Gray who reports to be feeling okay today. He was walking with his walker in the kitchen with no apparent increased shortness of breath. He denied any pain. He sat in his recliner where we assessed vitals and assessment. Lungs clear. He is up 6lbs from yesterday and claims he took his metolazone but admits to eating fried chicken Friday and eating chicken salad with ritz crackers yesterday. I reminded him the importance of keeping his sodium intake to a minimum and his fluid ounces less than 68 daily. I reviewed cardiomems and it was 5 today- goal is 9. He understood and agreed with same. We discussed him taking his potassium regularly and he reports he is. I contacted [facility] to see when his last fill was it was noted to be  1/8 for a 28 day supply and the bottle was mostly full. I encouraged him to be taking his prescribed amount daily. Meds reviewed and pill box filled for two weeks.   He is asking if his Charles Gray can be done by [facility] I will inquire about same.   Home visit complete- I will see Charles Gray in two weeks.   BP 118/62   Pulse 72   Resp 16   Wt 291 lb (132 kg)   SpO2 99%   BMI 30.43 kg/m  Weight yesterday-- 286lbs  Last visit weight-- 291lbs  Cardio Mems yesterday 4 Cardio Mems today 5      ACTION: Home visit completed     Patient Care Team: Nonnie Done., MD as PCP - General (Family Medicine) Laurey Morale, MD as PCP - Cardiology (Cardiology) [name], LCSW as Social Worker (Licensed Clinical Social Worker)  Patient Active Problem List   Diagnosis Date Noted   Acute on chronic heart failure with preserved ejection fraction (HFpEF) (HCC) 03/04/2022   Dyslipidemia 07/17/2021   Lymphedema 07/08/2021   Melena    Gastric polyps    UGIB (upper gastrointestinal bleed) 04/06/2021   Acute encephalopathy 12/21/2020   COPD (chronic obstructive pulmonary disease) (HCC) 12/21/2020   Chronic diastolic (congestive) heart failure (HCC) 11/14/2020   Syncope 10/28/2020   CHF (congestive heart failure) (HCC) 10/28/2020   Chronic venous stasis dermatitis of both lower extremities 08/27/2020   Cellulitis of both lower extremities 08/14/2020   Iron deficiency anemia 08/14/2020   GERD without esophagitis 06/10/2020   Atrial fibrillation with rapid ventricular response (HCC) 03/24/2020   Acute on chronic right heart failure (HCC) 01/27/2020   Acute respiratory failure with hypoxia (HCC)    Fatty liver disease, nonalcoholic    Shock (HCC)    Personal history of DVT (deep vein thrombosis) 12/26/2019   Chronic anticoagulation 12/26/2019   Palpitations 12/26/2019   Paroxysmal atrial fibrillation (HCC) 12/23/2019   Heart failure, systolic, acute (HCC) 08/24/2019   Acute on chronic congestive heart  failure (HCC)    Acute on chronic diastolic CHF (congestive heart failure) (HCC) 08/23/2019   Chest pain 08/23/2019   Lactic acidosis 08/23/2019   History of pulmonary embolism 08/12/2019   Atrial tachycardia (HCC) 08/12/2019   Occasional tremors 08/12/2019   OSA (obstructive sleep apnea) 08/12/2019   AKI (acute kidney injury) (HCC) 08/08/2019   Acute on chronic right-sided heart failure (HCC) 08/06/2019   Acute pulmonary embolism (HCC) 06/08/2019   Normocytic anemia 06/08/2019   Pulmonary emboli (HCC) 06/08/2019   Chronic heart failure with  preserved ejection fraction (HCC) 05/30/2019   Class 2 severe obesity due to excess calories with serious comorbidity and body mass index (BMI) of 39.0 to 39.9 in adult (HCC) 05/30/2019   Morbid obesity with BMI of 40.0-44.9, adult (HCC) 05/30/2019   Hyponatremia    Drug induced constipation    Peripheral edema    Hypotension due to drugs    Hypoalbuminemia due to protein-calorie malnutrition (HCC)    Thrombocytopenia (HCC)    Anemia of chronic disease    Cirrhosis of liver without ascites (HCC)    Chronic pain syndrome    Debility 03/13/2019   Portal hypertensive gastropathy (HCC) 03/06/2019   Dyspnea    GIB (gastrointestinal bleeding) 03/04/2019   Mixed hyperlipidemia 01/24/2019   Insomnia 01/24/2019   Hyperlipidemia 01/24/2019   Essential hypertension 01/24/2019   Lumbar disc herniation 01/24/2019    Current Outpatient Medications:    apixaban (ELIQUIS) 5 MG TABS tablet, Take 1 tablet (5 mg total) by mouth 2 (two) times daily., Disp: 60 tablet, Rfl: 3   Buprenorphine HCl-Naloxone HCl 8-2 MG FILM, Place 1 Film under the tongue in the morning and at bedtime., Disp: , Rfl:    cariprazine (VRAYLAR) 1.5 MG capsule, Take 1.5 mg by mouth daily. Per Dr. Orvan Falconer, Disp: , Rfl:    eplerenone (INSPRA) 50 MG tablet, Take 2 tablets (100 mg total) by mouth daily., Disp: 60 tablet, Rfl: 11   gabapentin (NEURONTIN) 800 MG tablet, Take 1 tablet (800 mg total) by mouth 3 (three) times daily., Disp: 90 tablet, Rfl: 1   lactulose, encephalopathy, (CHRONULAC) 10 GM/15ML SOLN, Take 30 mLs (20 g total) by mouth 2 (two) times daily. (Patient taking differently: Take 15 g by mouth 2 (two) times daily.), Disp: , Rfl:    metolazone (ZAROXOLYN) 2.5 MG tablet, Take 1 tablet (2.5 mg total) by mouth 3 (three) times a week. (Patient taking differently: Take 2.5 mg by mouth 3 (three) times a week. on Mondays Wednesdays and Fridays), Disp: 12 tablet, Rfl: 1   metoprolol succinate (TOPROL-XL) 25 MG 24 hr tablet, TAKE  ONE TABLET BY MOUTH AT BEDTIME, Disp: 30 tablet, Rfl: 11   ondansetron (ZOFRAN-ODT) 4 MG disintegrating tablet, Take 4 mg by mouth every 8 (eight) hours as needed for nausea or vomiting (dissolve orally)., Disp: , Rfl:    pantoprazole (PROTONIX) 40 MG tablet, Take 40 mg by mouth 2 (two) times daily before a meal., Disp: , Rfl:    polyethylene glycol (MIRALAX / GLYCOLAX) 17 g packet, Take 17 g by mouth as needed for mild constipation (mix and drink as directed)., Disp: , Rfl:    potassium chloride (KLOR-CON) 20 MEQ packet, Take 100 mEq by mouth 3 (three) times daily. Take an extra 40 mEq on days when you take metolazone, Disp: 475 packet, Rfl: 3   pravastatin (PRAVACHOL) 20 MG tablet, Take 1 tablet (20 mg total) by mouth daily., Disp: 30 tablet, Rfl: 11   sertraline (ZOLOFT)  100 MG tablet, Take 100 mg by mouth daily. At night, Disp: , Rfl:    tamsulosin (FLOMAX) 0.4 MG CAPS capsule, Take 1 capsule (0.4 mg total) by mouth daily. Additional refills will need to come from PCP, Disp: 30 capsule, Rfl: 0   torsemide (DEMADEX) 100 MG tablet, TAKE ONE TABLET BY MOUTH DAILY IN THE MORNING AND EVENING, Disp: 90 tablet, Rfl: 3   triamcinolone (KENALOG) 0.025 % ointment, Apply 1 application. topically 2 (two) times daily., Disp: 30 g, Rfl: 0   VENTOLIN HFA 108 (90 Base) MCG/ACT inhaler, Inhale 1 puff into the lungs every 4 (four) hours as needed for shortness of breath. (Patient taking differently: Inhale 2 puffs into the lungs every 4 (four) hours as needed for shortness of breath.), Disp: 6.7 g, Rfl: 1   zolpidem (AMBIEN) 10 MG tablet, Take 1 tablet (10 mg total) by mouth at bedtime as needed for sleep., Disp: 30 tablet, Rfl: 0 Allergies  Allergen Reactions   Penicillins Other (See Comments)    Caused convulsions as a child  Did it involve swelling of the face/tongue/throat, SOB, or low BP? N/A Did it involve sudden or severe rash/hives, skin peeling, or any reaction on the inside of your mouth or nose?  N/A Did you need to seek medical attention at a hospital or doctor's office?N/A When did it last happen? N/A If all above answers are "NO", may proceed with cephalosporin use.     Social History   Socioeconomic History   Marital status: Single    Spouse name: Not on file   Number of children: Not on file   Years of education: Not on file   Highest education level: Not on file  Occupational History   Not on file  Tobacco Use   Smoking status: Never   Smokeless tobacco: Never  Vaping Use   Vaping status: Never Used  Substance and Sexual Activity   Alcohol use: Not Currently   Drug use: Not Currently    Types: Marijuana    Comment: Smoked for 30 years   Sexual activity: Not on file  Other Topics Concern   Not on file  Social History Narrative   Not on file   Social Drivers of Health   Financial Resource Strain: Medium Risk (11/15/2020)   Overall Financial Resource Strain (CARDIA)    Difficulty of Paying Living Expenses: Somewhat hard  Food Insecurity: No Food Insecurity (03/08/2022)   Hunger Vital Sign    Worried About Running Out of Food in the Last Year: Never true    Ran Out of Food in the Last Year: Never true  Transportation Needs: Unmet Transportation Needs (04/21/2022)   PRAPARE - Administrator, Civil Service (Medical): Yes    Lack of Transportation (Non-Medical): Yes  Physical Activity: Not on file  Stress: Not on file  Social Connections: Not on file  Intimate Partner Violence: Not At Risk (03/08/2022)   Humiliation, Afraid, Rape, and Kick questionnaire    Fear of Current or Ex-Partner: No    Emotionally Abused: No    Physically Abused: No    Sexually Abused: No    Physical Exam      No future appointments.

## 2023-05-25 NOTE — Telephone Encounter (Signed)
HF Paramedicine Message to [facility]     Issue/reason for call: [name]    Handsome is requesting his [name] order be sent to [facility] for in home [employer] to be placed rather than wound care center as he struggles with transportation barriers. Please advise.   Maralyn Sago, EMT-Paramedic [phone] 05/25/2023

## 2023-05-26 NOTE — Telephone Encounter (Signed)
Attempted to contact paramed by phone -no answer -will route message

## 2023-05-26 NOTE — Telephone Encounter (Signed)
Pt has PCP visit 2/13 will discuss further at that visit. Para med aware

## 2023-05-26 NOTE — Telephone Encounter (Signed)
Confirmed with [facility] They are unable to provide unna boots, this is a skilled facility     Will forward to provider for further recommendations regarding care as patient has transportation barrier.

## 2023-05-27 ENCOUNTER — Other Ambulatory Visit (HOSPITAL_COMMUNITY): Payer: Self-pay | Admitting: Cardiology

## 2023-05-28 DIAGNOSIS — Z79899 Other long term (current) drug therapy: Secondary | ICD-10-CM | POA: Diagnosis not present

## 2023-05-28 DIAGNOSIS — R5381 Other malaise: Secondary | ICD-10-CM | POA: Diagnosis not present

## 2023-05-28 DIAGNOSIS — Z6841 Body Mass Index (BMI) 40.0 and over, adult: Secondary | ICD-10-CM | POA: Diagnosis not present

## 2023-05-28 DIAGNOSIS — I872 Venous insufficiency (chronic) (peripheral): Secondary | ICD-10-CM | POA: Diagnosis not present

## 2023-05-28 DIAGNOSIS — I5032 Chronic diastolic (congestive) heart failure: Secondary | ICD-10-CM | POA: Diagnosis not present

## 2023-05-28 DIAGNOSIS — R300 Dysuria: Secondary | ICD-10-CM | POA: Diagnosis not present

## 2023-05-28 DIAGNOSIS — Z1211 Encounter for screening for malignant neoplasm of colon: Secondary | ICD-10-CM | POA: Diagnosis not present

## 2023-06-01 DIAGNOSIS — F332 Major depressive disorder, recurrent severe without psychotic features: Secondary | ICD-10-CM | POA: Diagnosis not present

## 2023-06-01 DIAGNOSIS — F411 Generalized anxiety disorder: Secondary | ICD-10-CM | POA: Diagnosis not present

## 2023-06-01 DIAGNOSIS — F1120 Opioid dependence, uncomplicated: Secondary | ICD-10-CM | POA: Diagnosis not present

## 2023-06-07 DIAGNOSIS — Z1212 Encounter for screening for malignant neoplasm of rectum: Secondary | ICD-10-CM | POA: Diagnosis not present

## 2023-06-07 DIAGNOSIS — Z1211 Encounter for screening for malignant neoplasm of colon: Secondary | ICD-10-CM | POA: Diagnosis not present

## 2023-06-08 ENCOUNTER — Other Ambulatory Visit (HOSPITAL_COMMUNITY): Payer: Self-pay

## 2023-06-08 NOTE — Progress Notes (Signed)
 Paramedicine Encounter    Patient ID: Charles Gray, male    DOB: Oct 09, 1955, 68 y.o.   MRN: [id number]   Complaints- sinus congestion for 4 days, shortness of breath upon exertion, weight gain 2lbs overnight, lower legs swollen and red, general fatigue with deconditioning.   Assessment- CAOX4, warm and dry seated in his recliner reporting to be feeling okay but not great. Lower leg swelling present but no more than his baseline with edema and redness- unna boots have not been applied and unknown if PCP placed order for [facility] to do so. I will follow up. Lungs clear. Cardio Mems 10 today with a goal of 9. Vitals at baseline.   Compliance with meds- skipped metolazone yesterday- only took half of one today- taking potassium out of bottle.   Pill box filled- two weeks   Refills needed- vraylar   Meds changes since last visit- none     Social changes- none    VISIT SUMMARY- Arrived for home visit for Charles Gray who reports to be feeling okay but not great. He complains of a sinus headache with congestion X4 days, no use of OTC meds for same other than nasal saline spray. He endorses shortness of breath on exertion which is baseline for him due to deconditioning. Vitals and assessment obtained. Weight up 2lbs from yesterday and down 1lb from our last visit. Cardio Mems up today at 10 with a goal of 9- however he skipped metolazone yesterday but took 1/2 of one today along with his normal diuretics. I discussed need for plan to move towards independence with medications and he stated he did not have a plan in mind because he gets too easily confused and has no one reliable to assist. I will have LCSW assist in ideas moving forward. I will also reach out to PCP about unna boot order and recent labs. Home visit complete. I will see Charles Gray in two weeks.   BP 110/70   Pulse 65   Resp 16   Wt 290 lb (131.5 kg)   SpO2 98%   BMI 30.32 kg/m  Weight yesterday-- 288lbs  Last visit weight-- 291lbs       ACTION: Home visit completed     Patient Care Team: Nonnie Done., MD as PCP - General (Family Medicine) Laurey Morale, MD as PCP - Cardiology (Cardiology) [name], LCSW as Social Worker (Licensed Clinical Social Worker)  Patient Active Problem List   Diagnosis Date Noted   Acute on chronic heart failure with preserved ejection fraction (HFpEF) (HCC) 03/04/2022   Dyslipidemia 07/17/2021   Lymphedema 07/08/2021   Melena    Gastric polyps    UGIB (upper gastrointestinal bleed) 04/06/2021   Acute encephalopathy 12/21/2020   COPD (chronic obstructive pulmonary disease) (HCC) 12/21/2020   Chronic diastolic (congestive) heart failure (HCC) 11/14/2020   Syncope 10/28/2020   CHF (congestive heart failure) (HCC) 10/28/2020   Chronic venous stasis dermatitis of both lower extremities 08/27/2020   Cellulitis of both lower extremities 08/14/2020   Iron deficiency anemia 08/14/2020   GERD without esophagitis 06/10/2020   Atrial fibrillation with rapid ventricular response (HCC) 03/24/2020   Acute on chronic right heart failure (HCC) 01/27/2020   Acute respiratory failure with hypoxia (HCC)    Fatty liver disease, nonalcoholic    Shock (HCC)    Personal history of DVT (deep vein thrombosis) 12/26/2019   Chronic anticoagulation 12/26/2019   Palpitations 12/26/2019   Paroxysmal atrial fibrillation (HCC) 12/23/2019   Heart failure, systolic, acute (  HCC) 08/24/2019   Acute on chronic congestive heart failure (HCC)    Acute on chronic diastolic CHF (congestive heart failure) (HCC) 08/23/2019   Chest pain 08/23/2019   Lactic acidosis 08/23/2019   History of pulmonary embolism 08/12/2019   Atrial tachycardia (HCC) 08/12/2019   Occasional tremors 08/12/2019   OSA (obstructive sleep apnea) 08/12/2019   AKI (acute kidney injury) (HCC) 08/08/2019   Acute on chronic right-sided heart failure (HCC) 08/06/2019   Acute pulmonary embolism (HCC) 06/08/2019   Normocytic anemia  06/08/2019   Pulmonary emboli (HCC) 06/08/2019   Chronic heart failure with preserved ejection fraction (HCC) 05/30/2019   Class 2 severe obesity due to excess calories with serious comorbidity and body mass index (BMI) of 39.0 to 39.9 in adult (HCC) 05/30/2019   Morbid obesity with BMI of 40.0-44.9, adult (HCC) 05/30/2019   Hyponatremia    Drug induced constipation    Peripheral edema    Hypotension due to drugs    Hypoalbuminemia due to protein-calorie malnutrition (HCC)    Thrombocytopenia (HCC)    Anemia of chronic disease    Cirrhosis of liver without ascites (HCC)    Chronic pain syndrome    Debility 03/13/2019   Portal hypertensive gastropathy (HCC) 03/06/2019   Dyspnea    GIB (gastrointestinal bleeding) 03/04/2019   Mixed hyperlipidemia 01/24/2019   Insomnia 01/24/2019   Hyperlipidemia 01/24/2019   Essential hypertension 01/24/2019   Lumbar disc herniation 01/24/2019    Current Outpatient Medications:    apixaban (ELIQUIS) 5 MG TABS tablet, Take 1 tablet (5 mg total) by mouth 2 (two) times daily., Disp: 60 tablet, Rfl: 3   Buprenorphine HCl-Naloxone HCl 8-2 MG FILM, Place 1 Film under the tongue in the morning and at bedtime., Disp: , Rfl:    cariprazine (VRAYLAR) 1.5 MG capsule, Take 1.5 mg by mouth daily. Per Dr. Orvan Falconer, Disp: , Rfl:    eplerenone (INSPRA) 50 MG tablet, Take 2 tablets (100 mg total) by mouth daily., Disp: 60 tablet, Rfl: 11   gabapentin (NEURONTIN) 800 MG tablet, Take 1 tablet (800 mg total) by mouth 3 (three) times daily., Disp: 90 tablet, Rfl: 1   lactulose, encephalopathy, (CHRONULAC) 10 GM/15ML SOLN, Take 30 mLs (20 g total) by mouth 2 (two) times daily. (Patient taking differently: Take 15 g by mouth 2 (two) times daily.), Disp: , Rfl:    metolazone (ZAROXOLYN) 2.5 MG tablet, Take 1 tablet (2.5 mg total) by mouth 3 (three) times a week. on Mondays Wednesdays and Fridays, Disp: 15 tablet, Rfl: 3   metoprolol succinate (TOPROL-XL) 25 MG 24 hr tablet,  TAKE ONE TABLET BY MOUTH AT BEDTIME, Disp: 30 tablet, Rfl: 11   ondansetron (ZOFRAN-ODT) 4 MG disintegrating tablet, Take 4 mg by mouth every 8 (eight) hours as needed for nausea or vomiting (dissolve orally)., Disp: , Rfl:    pantoprazole (PROTONIX) 40 MG tablet, Take 40 mg by mouth 2 (two) times daily before a meal., Disp: , Rfl:    polyethylene glycol (MIRALAX / GLYCOLAX) 17 g packet, Take 17 g by mouth as needed for mild constipation (mix and drink as directed)., Disp: , Rfl:    potassium chloride (KLOR-CON) 20 MEQ packet, Take 100 mEq by mouth 3 (three) times daily. Take an extra 40 mEq on days when you take metolazone, Disp: 475 packet, Rfl: 3   pravastatin (PRAVACHOL) 20 MG tablet, Take 1 tablet (20 mg total) by mouth daily., Disp: 30 tablet, Rfl: 11   sertraline (ZOLOFT) 100 MG tablet, Take  100 mg by mouth daily. At night, Disp: , Rfl:    tamsulosin (FLOMAX) 0.4 MG CAPS capsule, Take 1 capsule (0.4 mg total) by mouth daily. Additional refills will need to come from PCP, Disp: 30 capsule, Rfl: 0   torsemide (DEMADEX) 100 MG tablet, TAKE ONE TABLET BY MOUTH DAILY IN THE MORNING AND EVENING, Disp: 90 tablet, Rfl: 3   triamcinolone (KENALOG) 0.025 % ointment, Apply 1 application. topically 2 (two) times daily., Disp: 30 g, Rfl: 0   VENTOLIN HFA 108 (90 Base) MCG/ACT inhaler, Inhale 1 puff into the lungs every 4 (four) hours as needed for shortness of breath. (Patient taking differently: Inhale 2 puffs into the lungs every 4 (four) hours as needed for shortness of breath.), Disp: 6.7 g, Rfl: 1   zolpidem (AMBIEN) 10 MG tablet, Take 1 tablet (10 mg total) by mouth at bedtime as needed for sleep., Disp: 30 tablet, Rfl: 0 Allergies  Allergen Reactions   Penicillins Other (See Comments)    Caused convulsions as a child  Did it involve swelling of the face/tongue/throat, SOB, or low BP? N/A Did it involve sudden or severe rash/hives, skin peeling, or any reaction on the inside of your mouth or nose?  N/A Did you need to seek medical attention at a hospital or doctor's office?N/A When did it last happen? N/A If all above answers are "NO", may proceed with cephalosporin use.     Social History   Socioeconomic History   Marital status: Single    Spouse name: Not on file   Number of children: Not on file   Years of education: Not on file   Highest education level: Not on file  Occupational History   Not on file  Tobacco Use   Smoking status: Never   Smokeless tobacco: Never  Vaping Use   Vaping status: Never Used  Substance and Sexual Activity   Alcohol use: Not Currently   Drug use: Not Currently    Types: Marijuana    Comment: Smoked for 30 years   Sexual activity: Not on file  Other Topics Concern   Not on file  Social History Narrative   Not on file   Social Drivers of Health   Financial Resource Strain: Medium Risk (11/15/2020)   Overall Financial Resource Strain (CARDIA)    Difficulty of Paying Living Expenses: Somewhat hard  Food Insecurity: No Food Insecurity (03/08/2022)   Hunger Vital Sign    Worried About Running Out of Food in the Last Year: Never true    Ran Out of Food in the Last Year: Never true  Transportation Needs: Unmet Transportation Needs (04/21/2022)   PRAPARE - Administrator, Civil Service (Medical): Yes    Lack of Transportation (Non-Medical): Yes  Physical Activity: Not on file  Stress: Not on file  Social Connections: Not on file  Intimate Partner Violence: Not At Risk (03/08/2022)   Humiliation, Afraid, Rape, and Kick questionnaire    Fear of Current or Ex-Partner: No    Emotionally Abused: No    Physically Abused: No    Sexually Abused: No    Physical Exam      No future appointments.

## 2023-06-12 DIAGNOSIS — R32 Unspecified urinary incontinence: Secondary | ICD-10-CM | POA: Diagnosis not present

## 2023-06-14 DIAGNOSIS — M5136 Other intervertebral disc degeneration, lumbar region with discogenic back pain only: Secondary | ICD-10-CM | POA: Diagnosis not present

## 2023-06-14 DIAGNOSIS — I480 Paroxysmal atrial fibrillation: Secondary | ICD-10-CM | POA: Diagnosis not present

## 2023-06-14 DIAGNOSIS — E1151 Type 2 diabetes mellitus with diabetic peripheral angiopathy without gangrene: Secondary | ICD-10-CM | POA: Diagnosis not present

## 2023-06-14 DIAGNOSIS — I110 Hypertensive heart disease with heart failure: Secondary | ICD-10-CM | POA: Diagnosis not present

## 2023-06-14 DIAGNOSIS — L97812 Non-pressure chronic ulcer of other part of right lower leg with fat layer exposed: Secondary | ICD-10-CM | POA: Diagnosis not present

## 2023-06-14 DIAGNOSIS — G8929 Other chronic pain: Secondary | ICD-10-CM | POA: Diagnosis not present

## 2023-06-14 DIAGNOSIS — J449 Chronic obstructive pulmonary disease, unspecified: Secondary | ICD-10-CM | POA: Diagnosis not present

## 2023-06-14 DIAGNOSIS — I5032 Chronic diastolic (congestive) heart failure: Secondary | ICD-10-CM | POA: Diagnosis not present

## 2023-06-14 DIAGNOSIS — I872 Venous insufficiency (chronic) (peripheral): Secondary | ICD-10-CM | POA: Diagnosis not present

## 2023-06-16 ENCOUNTER — Other Ambulatory Visit (HOSPITAL_COMMUNITY): Payer: Self-pay | Admitting: Cardiology

## 2023-06-17 DIAGNOSIS — G8929 Other chronic pain: Secondary | ICD-10-CM | POA: Diagnosis not present

## 2023-06-17 DIAGNOSIS — I480 Paroxysmal atrial fibrillation: Secondary | ICD-10-CM | POA: Diagnosis not present

## 2023-06-17 DIAGNOSIS — J449 Chronic obstructive pulmonary disease, unspecified: Secondary | ICD-10-CM | POA: Diagnosis not present

## 2023-06-17 DIAGNOSIS — I5032 Chronic diastolic (congestive) heart failure: Secondary | ICD-10-CM | POA: Diagnosis not present

## 2023-06-17 DIAGNOSIS — I110 Hypertensive heart disease with heart failure: Secondary | ICD-10-CM | POA: Diagnosis not present

## 2023-06-17 DIAGNOSIS — E1151 Type 2 diabetes mellitus with diabetic peripheral angiopathy without gangrene: Secondary | ICD-10-CM | POA: Diagnosis not present

## 2023-06-17 DIAGNOSIS — I872 Venous insufficiency (chronic) (peripheral): Secondary | ICD-10-CM | POA: Diagnosis not present

## 2023-06-17 DIAGNOSIS — M5136 Other intervertebral disc degeneration, lumbar region with discogenic back pain only: Secondary | ICD-10-CM | POA: Diagnosis not present

## 2023-06-17 DIAGNOSIS — L97812 Non-pressure chronic ulcer of other part of right lower leg with fat layer exposed: Secondary | ICD-10-CM | POA: Diagnosis not present

## 2023-06-21 DIAGNOSIS — I110 Hypertensive heart disease with heart failure: Secondary | ICD-10-CM | POA: Diagnosis not present

## 2023-06-21 DIAGNOSIS — E1151 Type 2 diabetes mellitus with diabetic peripheral angiopathy without gangrene: Secondary | ICD-10-CM | POA: Diagnosis not present

## 2023-06-21 DIAGNOSIS — I5032 Chronic diastolic (congestive) heart failure: Secondary | ICD-10-CM | POA: Diagnosis not present

## 2023-06-21 DIAGNOSIS — M5136 Other intervertebral disc degeneration, lumbar region with discogenic back pain only: Secondary | ICD-10-CM | POA: Diagnosis not present

## 2023-06-21 DIAGNOSIS — L97812 Non-pressure chronic ulcer of other part of right lower leg with fat layer exposed: Secondary | ICD-10-CM | POA: Diagnosis not present

## 2023-06-21 DIAGNOSIS — J449 Chronic obstructive pulmonary disease, unspecified: Secondary | ICD-10-CM | POA: Diagnosis not present

## 2023-06-21 DIAGNOSIS — I872 Venous insufficiency (chronic) (peripheral): Secondary | ICD-10-CM | POA: Diagnosis not present

## 2023-06-21 DIAGNOSIS — G8929 Other chronic pain: Secondary | ICD-10-CM | POA: Diagnosis not present

## 2023-06-21 DIAGNOSIS — I480 Paroxysmal atrial fibrillation: Secondary | ICD-10-CM | POA: Diagnosis not present

## 2023-06-22 ENCOUNTER — Other Ambulatory Visit (HOSPITAL_COMMUNITY): Payer: Self-pay

## 2023-06-22 NOTE — Progress Notes (Signed)
 Paramedicine Encounter    Patient ID: Charles Gray, male    DOB: 02-Jan-1956, 68 y.o.   MRN: [id number]   Complaints- chronic pain, leg swelling and shortness of breath on exertion.  Assessment- CAOX4, warm and dry seated in recliner, legs wrapped by [name] yesterday, lungs clear, vitals at baseline.   Compliance with meds- denied missing any meds- one metolazone left in pill box from last Wednesday [name] CNA- says he dropped it on the floor and placed it back in pill box)   Pill box filled- for two weeks   Refills needed- metoprolol   Meds changes since last visit- none     Social changes- none    VISIT SUMMARY- Arrived for home visit for Charles Gray who reports to be feeling okay today just complaining of chronic pain, shortness of breath on exertion, fatigue and leg swelling. Legs wrapped in unna boots by [name] yesterday. Vitals obtained and as noted. Cardio Mems is 6 today and was 6 yesterday also. We reviewed meds and I filled pill box for two weeks. We discussed med plan and [name] says she could help with medications if needed but not a dependable source due to her work schedule. Charles Gray, myself and [name] plan to see what options are available to him for bubble packs at local pharmacies. No upcoming appointments noted. I plan to see Charles Gray in two weeks. Home visit complete.   BP 122/60   Pulse 70   Resp 18   Wt 290 lb (131.5 kg)   SpO2 99%   BMI 30.32 kg/m  Weight yesterday-- 286lbs Last visit weight-- 290lbs      ACTION: Home visit completed     Patient Care Team: Nonnie Done., MD as PCP - General (Family Medicine) Laurey Morale, MD as PCP - Cardiology (Cardiology) [name], LCSW as Social Worker (Licensed Clinical Social Worker)  Patient Active Problem List   Diagnosis Date Noted   Acute on chronic heart failure with preserved ejection fraction (HFpEF) (HCC) 03/04/2022   Dyslipidemia 07/17/2021   Lymphedema 07/08/2021   Melena    Gastric  polyps    UGIB (upper gastrointestinal bleed) 04/06/2021   Acute encephalopathy 12/21/2020   COPD (chronic obstructive pulmonary disease) (HCC) 12/21/2020   Chronic diastolic (congestive) heart failure (HCC) 11/14/2020   Syncope 10/28/2020   CHF (congestive heart failure) (HCC) 10/28/2020   Chronic venous stasis dermatitis of both lower extremities 08/27/2020   Cellulitis of both lower extremities 08/14/2020   Iron deficiency anemia 08/14/2020   GERD without esophagitis 06/10/2020   Atrial fibrillation with rapid ventricular response (HCC) 03/24/2020   Acute on chronic right heart failure (HCC) 01/27/2020   Acute respiratory failure with hypoxia (HCC)    Fatty liver disease, nonalcoholic    Shock (HCC)    Personal history of DVT (deep vein thrombosis) 12/26/2019   Chronic anticoagulation 12/26/2019   Palpitations 12/26/2019   Paroxysmal atrial fibrillation (HCC) 12/23/2019   Heart failure, systolic, acute (HCC) 08/24/2019   Acute on chronic congestive heart failure (HCC)    Acute on chronic diastolic CHF (congestive heart failure) (HCC) 08/23/2019   Chest pain 08/23/2019   Lactic acidosis 08/23/2019   History of pulmonary embolism 08/12/2019   Atrial tachycardia (HCC) 08/12/2019   Occasional tremors 08/12/2019   OSA (obstructive sleep apnea) 08/12/2019   AKI (acute kidney injury) (HCC) 08/08/2019   Acute on chronic right-sided heart failure (HCC) 08/06/2019   Acute pulmonary embolism (HCC) 06/08/2019   Normocytic anemia 06/08/2019  Pulmonary emboli (HCC) 06/08/2019   Chronic heart failure with preserved ejection fraction (HCC) 05/30/2019   Class 2 severe obesity due to excess calories with serious comorbidity and body mass index (BMI) of 39.0 to 39.9 in adult [facility]) 05/30/2019   Morbid obesity with BMI of 40.0-44.9, adult (HCC) 05/30/2019   Hyponatremia    Drug induced constipation    Peripheral edema    Hypotension due to drugs    Hypoalbuminemia due to protein-calorie  malnutrition (HCC)    Thrombocytopenia (HCC)    Anemia of chronic disease    Cirrhosis of liver without ascites (HCC)    Chronic pain syndrome    Debility 03/13/2019   Portal hypertensive gastropathy (HCC) 03/06/2019   Dyspnea    GIB (gastrointestinal bleeding) 03/04/2019   Mixed hyperlipidemia 01/24/2019   Insomnia 01/24/2019   Hyperlipidemia 01/24/2019   Essential hypertension 01/24/2019   Lumbar disc herniation 01/24/2019    Current Outpatient Medications:    apixaban (ELIQUIS) 5 MG TABS tablet, Take 1 tablet (5 mg total) by mouth 2 (two) times daily., Disp: 60 tablet, Rfl: 3   Buprenorphine HCl-Naloxone HCl 8-2 MG FILM, Place 1 Film under the tongue in the morning and at bedtime., Disp: , Rfl:    cariprazine (VRAYLAR) 1.5 MG capsule, Take 1.5 mg by mouth daily. Per Dr. Orvan Falconer, Disp: , Rfl:    eplerenone (INSPRA) 50 MG tablet, Take 2 tablets (100 mg total) by mouth daily., Disp: 60 tablet, Rfl: 11   gabapentin (NEURONTIN) 800 MG tablet, Take 1 tablet (800 mg total) by mouth 3 (three) times daily., Disp: 90 tablet, Rfl: 1   lactulose, encephalopathy, (CHRONULAC) 10 GM/15ML SOLN, Take 30 mLs (20 g total) by mouth 2 (two) times daily. (Patient taking differently: Take 15 g by mouth 2 (two) times daily.), Disp: , Rfl:    metolazone (ZAROXOLYN) 2.5 MG tablet, Take 1 tablet (2.5 mg total) by mouth 3 (three) times a week. on Mondays Wednesdays and Fridays, Disp: 15 tablet, Rfl: 3   metoprolol succinate (TOPROL-XL) 25 MG 24 hr tablet, TAKE ONE TABLET BY MOUTH AT BEDTIME, Disp: 30 tablet, Rfl: 11   ondansetron (ZOFRAN-ODT) 4 MG disintegrating tablet, Take 4 mg by mouth every 8 (eight) hours as needed for nausea or vomiting (dissolve orally)., Disp: , Rfl:    pantoprazole (PROTONIX) 40 MG tablet, Take 40 mg by mouth 2 (two) times daily before a meal., Disp: , Rfl:    polyethylene glycol (MIRALAX / GLYCOLAX) 17 g packet, Take 17 g by mouth as needed for mild constipation (mix and drink as  directed)., Disp: , Rfl:    potassium chloride (KLOR-CON) 20 MEQ packet, Take 100 mEq by mouth 3 (three) times daily. Take an extra 40 mEq on days when you take metolazone, Disp: 475 packet, Rfl: 3   pravastatin (PRAVACHOL) 20 MG tablet, Take 1 tablet (20 mg total) by mouth daily., Disp: 30 tablet, Rfl: 11   sertraline (ZOLOFT) 100 MG tablet, Take 100 mg by mouth daily. At night, Disp: , Rfl:    tamsulosin (FLOMAX) 0.4 MG CAPS capsule, Take 1 capsule (0.4 mg total) by mouth daily. Additional refills will need to come from PCP, Disp: 30 capsule, Rfl: 0   torsemide (DEMADEX) 100 MG tablet, TAKE ONE TABLET BY MOUTH DAILY IN THE MORNING AND EVENING, Disp: 90 tablet, Rfl: 3   triamcinolone (KENALOG) 0.025 % ointment, Apply 1 application. topically 2 (two) times daily., Disp: 30 g, Rfl: 0   VENTOLIN HFA 108 (90 Base)  MCG/ACT inhaler, Inhale 1 puff into the lungs every 4 (four) hours as needed for shortness of breath. (Patient taking differently: Inhale 2 puffs into the lungs every 4 (four) hours as needed for shortness of breath.), Disp: 6.7 g, Rfl: 1   zolpidem (AMBIEN) 10 MG tablet, Take 1 tablet (10 mg total) by mouth at bedtime as needed for sleep., Disp: 30 tablet, Rfl: 0 Allergies  Allergen Reactions   Penicillins Other (See Comments)    Caused convulsions as a child  Did it involve swelling of the face/tongue/throat, SOB, or low BP? N/A Did it involve sudden or severe rash/hives, skin peeling, or any reaction on the inside of your mouth or nose? N/A Did you need to seek medical attention at a hospital or doctor's office?N/A When did it last happen? N/A If all above answers are "NO", may proceed with cephalosporin use.     Social History   Socioeconomic History   Marital status: Single    Spouse name: Not on file   Number of children: Not on file   Years of education: Not on file   Highest education level: Not on file  Occupational History   Not on file  Tobacco Use   Smoking status:  Never   Smokeless tobacco: Never  Vaping Use   Vaping status: Never Used  Substance and Sexual Activity   Alcohol use: Not Currently   Drug use: Not Currently    Types: Marijuana    Comment: Smoked for 30 years   Sexual activity: Not on file  Other Topics Concern   Not on file  Social History Narrative   Not on file   Social Drivers of Health   Financial Resource Strain: Medium Risk (11/15/2020)   Overall Financial Resource Strain (CARDIA)    Difficulty of Paying Living Expenses: Somewhat hard  Food Insecurity: No Food Insecurity (03/08/2022)   Hunger Vital Sign    Worried About Running Out of Food in the Last Year: Never true    Ran Out of Food in the Last Year: Never true  Transportation Needs: Unmet Transportation Needs (04/21/2022)   PRAPARE - Administrator, Civil Service (Medical): Yes    Lack of Transportation (Non-Medical): Yes  Physical Activity: Not on file  Stress: Not on file  Social Connections: Not on file  Intimate Partner Violence: Not At Risk (03/08/2022)   Humiliation, Afraid, Rape, and Kick questionnaire    Fear of Current or Ex-Partner: No    Emotionally Abused: No    Physically Abused: No    Sexually Abused: No    Physical Exam      No future appointments.

## 2023-06-24 DIAGNOSIS — M5136 Other intervertebral disc degeneration, lumbar region with discogenic back pain only: Secondary | ICD-10-CM | POA: Diagnosis not present

## 2023-06-24 DIAGNOSIS — J449 Chronic obstructive pulmonary disease, unspecified: Secondary | ICD-10-CM | POA: Diagnosis not present

## 2023-06-24 DIAGNOSIS — E1151 Type 2 diabetes mellitus with diabetic peripheral angiopathy without gangrene: Secondary | ICD-10-CM | POA: Diagnosis not present

## 2023-06-24 DIAGNOSIS — I110 Hypertensive heart disease with heart failure: Secondary | ICD-10-CM | POA: Diagnosis not present

## 2023-06-24 DIAGNOSIS — Z6834 Body mass index (BMI) 34.0-34.9, adult: Secondary | ICD-10-CM | POA: Diagnosis not present

## 2023-06-24 DIAGNOSIS — F332 Major depressive disorder, recurrent severe without psychotic features: Secondary | ICD-10-CM | POA: Diagnosis not present

## 2023-06-24 DIAGNOSIS — I480 Paroxysmal atrial fibrillation: Secondary | ICD-10-CM | POA: Diagnosis not present

## 2023-06-24 DIAGNOSIS — F1120 Opioid dependence, uncomplicated: Secondary | ICD-10-CM | POA: Diagnosis not present

## 2023-06-24 DIAGNOSIS — L97812 Non-pressure chronic ulcer of other part of right lower leg with fat layer exposed: Secondary | ICD-10-CM | POA: Diagnosis not present

## 2023-06-24 DIAGNOSIS — F411 Generalized anxiety disorder: Secondary | ICD-10-CM | POA: Diagnosis not present

## 2023-06-24 DIAGNOSIS — I5032 Chronic diastolic (congestive) heart failure: Secondary | ICD-10-CM | POA: Diagnosis not present

## 2023-06-24 DIAGNOSIS — I872 Venous insufficiency (chronic) (peripheral): Secondary | ICD-10-CM | POA: Diagnosis not present

## 2023-06-24 DIAGNOSIS — G8929 Other chronic pain: Secondary | ICD-10-CM | POA: Diagnosis not present

## 2023-06-28 ENCOUNTER — Telehealth (HOSPITAL_COMMUNITY): Payer: Self-pay | Admitting: Licensed Clinical Social Worker

## 2023-06-28 NOTE — Telephone Encounter (Signed)
 H&V Care Navigation CSW Progress Note  Clinical Social Worker informed that patient PCP can no longer sign script for incontinence supplies as he is not in network with pt Medicaid.  CSW confirmed pt does not have another provider who can do so.  Dr. Shirlee Latch willing to sign script for these supplies- [facility] informed and sending [place] script to be signed.  Patient updated.   SDOH Screenings   Food Insecurity: No Food Insecurity (03/08/2022)  Housing: Low Risk  (03/08/2022)  Transportation Needs: Unmet Transportation Needs (04/21/2022)  Utilities: Not At Risk (03/08/2022)  Alcohol Screen: Low Risk  (06/11/2020)  Depression (PHQ2-9): Medium Risk (06/11/2020)  Financial Resource Strain: Medium Risk (11/15/2020)  Tobacco Use: Low Risk  (05/21/2023)   [name], LCSW Clinical Social Worker [facility] Desk#: [phone] Cell#: [phone]

## 2023-06-29 DIAGNOSIS — I480 Paroxysmal atrial fibrillation: Secondary | ICD-10-CM | POA: Diagnosis not present

## 2023-06-29 DIAGNOSIS — J449 Chronic obstructive pulmonary disease, unspecified: Secondary | ICD-10-CM | POA: Diagnosis not present

## 2023-06-29 DIAGNOSIS — I5032 Chronic diastolic (congestive) heart failure: Secondary | ICD-10-CM | POA: Diagnosis not present

## 2023-06-29 DIAGNOSIS — M5136 Other intervertebral disc degeneration, lumbar region with discogenic back pain only: Secondary | ICD-10-CM | POA: Diagnosis not present

## 2023-06-29 DIAGNOSIS — I872 Venous insufficiency (chronic) (peripheral): Secondary | ICD-10-CM | POA: Diagnosis not present

## 2023-06-29 DIAGNOSIS — I110 Hypertensive heart disease with heart failure: Secondary | ICD-10-CM | POA: Diagnosis not present

## 2023-06-29 DIAGNOSIS — G8929 Other chronic pain: Secondary | ICD-10-CM | POA: Diagnosis not present

## 2023-06-29 DIAGNOSIS — L97812 Non-pressure chronic ulcer of other part of right lower leg with fat layer exposed: Secondary | ICD-10-CM | POA: Diagnosis not present

## 2023-06-29 DIAGNOSIS — E1151 Type 2 diabetes mellitus with diabetic peripheral angiopathy without gangrene: Secondary | ICD-10-CM | POA: Diagnosis not present

## 2023-06-30 ENCOUNTER — Telehealth (HOSPITAL_COMMUNITY): Payer: Self-pay | Admitting: Licensed Clinical Social Worker

## 2023-06-30 NOTE — Telephone Encounter (Signed)
 H&V Care Navigation CSW Progress Note  Clinical Social Worker sent in script for incontinence supplies to [facility]- fax confirmation received.   SDOH Screenings   Food Insecurity: No Food Insecurity (03/08/2022)  Housing: Low Risk  (03/08/2022)  Transportation Needs: Unmet Transportation Needs (04/21/2022)  Utilities: Not At Risk (03/08/2022)  Alcohol Screen: Low Risk  (06/11/2020)  Depression (PHQ2-9): Medium Risk (06/11/2020)  Financial Resource Strain: Medium Risk (11/15/2020)  Tobacco Use: Low Risk  (05/21/2023)   [name], LCSW Clinical Social Worker [facility] Desk#: [phone] Cell#: [phone]

## 2023-06-30 NOTE — Addendum Note (Signed)
 Encounter addended by: Jacklynn Ganong, FNP on: 06/30/2023 11:06 AM  Actions taken: Clinical Note Signed

## 2023-07-01 DIAGNOSIS — G8929 Other chronic pain: Secondary | ICD-10-CM | POA: Diagnosis not present

## 2023-07-01 DIAGNOSIS — J449 Chronic obstructive pulmonary disease, unspecified: Secondary | ICD-10-CM | POA: Diagnosis not present

## 2023-07-01 DIAGNOSIS — R32 Unspecified urinary incontinence: Secondary | ICD-10-CM | POA: Diagnosis not present

## 2023-07-01 DIAGNOSIS — L97812 Non-pressure chronic ulcer of other part of right lower leg with fat layer exposed: Secondary | ICD-10-CM | POA: Diagnosis not present

## 2023-07-01 DIAGNOSIS — I872 Venous insufficiency (chronic) (peripheral): Secondary | ICD-10-CM | POA: Diagnosis not present

## 2023-07-01 DIAGNOSIS — E1151 Type 2 diabetes mellitus with diabetic peripheral angiopathy without gangrene: Secondary | ICD-10-CM | POA: Diagnosis not present

## 2023-07-01 DIAGNOSIS — I110 Hypertensive heart disease with heart failure: Secondary | ICD-10-CM | POA: Diagnosis not present

## 2023-07-01 DIAGNOSIS — I480 Paroxysmal atrial fibrillation: Secondary | ICD-10-CM | POA: Diagnosis not present

## 2023-07-01 DIAGNOSIS — I5032 Chronic diastolic (congestive) heart failure: Secondary | ICD-10-CM | POA: Diagnosis not present

## 2023-07-01 DIAGNOSIS — M5136 Other intervertebral disc degeneration, lumbar region with discogenic back pain only: Secondary | ICD-10-CM | POA: Diagnosis not present

## 2023-07-05 DIAGNOSIS — I872 Venous insufficiency (chronic) (peripheral): Secondary | ICD-10-CM | POA: Diagnosis not present

## 2023-07-05 DIAGNOSIS — I110 Hypertensive heart disease with heart failure: Secondary | ICD-10-CM | POA: Diagnosis not present

## 2023-07-05 DIAGNOSIS — L97812 Non-pressure chronic ulcer of other part of right lower leg with fat layer exposed: Secondary | ICD-10-CM | POA: Diagnosis not present

## 2023-07-05 DIAGNOSIS — J449 Chronic obstructive pulmonary disease, unspecified: Secondary | ICD-10-CM | POA: Diagnosis not present

## 2023-07-05 DIAGNOSIS — E1151 Type 2 diabetes mellitus with diabetic peripheral angiopathy without gangrene: Secondary | ICD-10-CM | POA: Diagnosis not present

## 2023-07-05 DIAGNOSIS — G8929 Other chronic pain: Secondary | ICD-10-CM | POA: Diagnosis not present

## 2023-07-05 DIAGNOSIS — I5032 Chronic diastolic (congestive) heart failure: Secondary | ICD-10-CM | POA: Diagnosis not present

## 2023-07-05 DIAGNOSIS — M5136 Other intervertebral disc degeneration, lumbar region with discogenic back pain only: Secondary | ICD-10-CM | POA: Diagnosis not present

## 2023-07-05 DIAGNOSIS — I480 Paroxysmal atrial fibrillation: Secondary | ICD-10-CM | POA: Diagnosis not present

## 2023-07-06 ENCOUNTER — Other Ambulatory Visit (HOSPITAL_COMMUNITY): Payer: Self-pay

## 2023-07-06 NOTE — Progress Notes (Signed)
 Paramedicine Encounter    Patient ID: Charles Gray, male    DOB: 15-Aug-1955, 68 y.o.   MRN: [id number]   Complaints- general fatigue, muscle and joint aches and pains.   Assessment- CAOX4, warm and dry, seated in his recliner. Denied any increased dizziness, chest pain or shortness of breath more than his baseline.   Compliance with meds- a few gabapentin noon doses missed- denied any missed metolazone doses   Pill box filled- for one week   Refills needed-  Pravastatin Inspra  Gabapentin Flomax Vraylar Torsemide   Meds changes since last visit- none     Social changes- none    VISIT SUMMARY- Arrived for home visit for Charles Gray who reports to be feeling okay other than general aches and pains. Vitals obtained and as noted. Legs wrapped by [facility] RN yesterday. He denied increased chest pain, dizziness or shortness of breath more than baseline. Lungs clear. I reviewed meds and filled pill box for two weeks. Home aide helps with cardio mems daily. Mems have all been below goal. We discussed HF education and he verbalized understanding. Home visit complete I will see Charles Gray in two weeks.   There were no vitals taken for this visit. Weight yesterday-289lbs  Last visit weight-- 290lbs  Weight Sunday- 292lbs      ACTION: Home visit completed     Patient Care Team: Nonnie Done., MD as PCP - General (Family Medicine) Laurey Morale, MD as PCP - Cardiology (Cardiology) [name], LCSW as Social Worker (Licensed Clinical Social Worker)  Patient Active Problem List   Diagnosis Date Noted   Acute on chronic heart failure with preserved ejection fraction (HFpEF) (HCC) 03/04/2022   Dyslipidemia 07/17/2021   Lymphedema 07/08/2021   Melena    Gastric polyps    UGIB (upper gastrointestinal bleed) 04/06/2021   Acute encephalopathy 12/21/2020   COPD (chronic obstructive pulmonary disease) (HCC) 12/21/2020   Chronic diastolic (congestive) heart failure (HCC) 11/14/2020    Syncope 10/28/2020   CHF (congestive heart failure) (HCC) 10/28/2020   Chronic venous stasis dermatitis of both lower extremities 08/27/2020   Cellulitis of both lower extremities 08/14/2020   Iron deficiency anemia 08/14/2020   GERD without esophagitis 06/10/2020   Atrial fibrillation with rapid ventricular response (HCC) 03/24/2020   Acute on chronic right heart failure (HCC) 01/27/2020   Acute respiratory failure with hypoxia (HCC)    Fatty liver disease, nonalcoholic    Shock (HCC)    Personal history of DVT (deep vein thrombosis) 12/26/2019   Chronic anticoagulation 12/26/2019   Palpitations 12/26/2019   Paroxysmal atrial fibrillation (HCC) 12/23/2019   Heart failure, systolic, acute (HCC) 08/24/2019   Acute on chronic congestive heart failure (HCC)    Acute on chronic diastolic CHF (congestive heart failure) (HCC) 08/23/2019   Chest pain 08/23/2019   Lactic acidosis 08/23/2019   History of pulmonary embolism 08/12/2019   Atrial tachycardia (HCC) 08/12/2019   Occasional tremors 08/12/2019   OSA (obstructive sleep apnea) 08/12/2019   AKI (acute kidney injury) (HCC) 08/08/2019   Acute on chronic right-sided heart failure (HCC) 08/06/2019   Acute pulmonary embolism (HCC) 06/08/2019   Normocytic anemia 06/08/2019   Pulmonary emboli (HCC) 06/08/2019   Chronic heart failure with preserved ejection fraction (HCC) 05/30/2019   Class 2 severe obesity due to excess calories with serious comorbidity and body mass index (BMI) of 39.0 to 39.9 in adult (HCC) 05/30/2019   Morbid obesity with BMI of 40.0-44.9, adult (HCC) 05/30/2019   Hyponatremia  Drug induced constipation    Peripheral edema    Hypotension due to drugs    Hypoalbuminemia due to protein-calorie malnutrition (HCC)    Thrombocytopenia (HCC)    Anemia of chronic disease    Cirrhosis of liver without ascites (HCC)    Chronic pain syndrome    Debility 03/13/2019   Portal hypertensive gastropathy (HCC) 03/06/2019    Dyspnea    GIB (gastrointestinal bleeding) 03/04/2019   Mixed hyperlipidemia 01/24/2019   Insomnia 01/24/2019   Hyperlipidemia 01/24/2019   Essential hypertension 01/24/2019   Lumbar disc herniation 01/24/2019    Current Outpatient Medications:    apixaban (ELIQUIS) 5 MG TABS tablet, Take 1 tablet (5 mg total) by mouth 2 (two) times daily., Disp: 60 tablet, Rfl: 3   Buprenorphine HCl-Naloxone HCl 8-2 MG FILM, Place 1 Film under the tongue in the morning and at bedtime., Disp: , Rfl:    cariprazine (VRAYLAR) 1.5 MG capsule, Take 1.5 mg by mouth daily. Per Dr. Orvan Falconer, Disp: , Rfl:    eplerenone (INSPRA) 50 MG tablet, Take 2 tablets (100 mg total) by mouth daily., Disp: 60 tablet, Rfl: 11   gabapentin (NEURONTIN) 800 MG tablet, Take 1 tablet (800 mg total) by mouth 3 (three) times daily., Disp: 90 tablet, Rfl: 1   lactulose, encephalopathy, (CHRONULAC) 10 GM/15ML SOLN, Take 30 mLs (20 g total) by mouth 2 (two) times daily. (Patient taking differently: Take 15 g by mouth 2 (two) times daily.), Disp: , Rfl:    metolazone (ZAROXOLYN) 2.5 MG tablet, Take 1 tablet (2.5 mg total) by mouth 3 (three) times a week. on Mondays Wednesdays and Fridays, Disp: 15 tablet, Rfl: 3   metoprolol succinate (TOPROL-XL) 25 MG 24 hr tablet, TAKE ONE TABLET BY MOUTH AT BEDTIME, Disp: 30 tablet, Rfl: 11   ondansetron (ZOFRAN-ODT) 4 MG disintegrating tablet, Take 4 mg by mouth every 8 (eight) hours as needed for nausea or vomiting (dissolve orally)., Disp: , Rfl:    pantoprazole (PROTONIX) 40 MG tablet, Take 40 mg by mouth 2 (two) times daily before a meal., Disp: , Rfl:    polyethylene glycol (MIRALAX / GLYCOLAX) 17 g packet, Take 17 g by mouth as needed for mild constipation (mix and drink as directed)., Disp: , Rfl:    potassium chloride (KLOR-CON) 20 MEQ packet, Take 100 mEq by mouth 3 (three) times daily. Take an extra 40 mEq on days when you take metolazone, Disp: 475 packet, Rfl: 3   pravastatin (PRAVACHOL) 20 MG  tablet, Take 1 tablet (20 mg total) by mouth daily., Disp: 30 tablet, Rfl: 11   sertraline (ZOLOFT) 100 MG tablet, Take 100 mg by mouth daily. At night, Disp: , Rfl:    tamsulosin (FLOMAX) 0.4 MG CAPS capsule, Take 1 capsule (0.4 mg total) by mouth daily. Additional refills will need to come from PCP, Disp: 30 capsule, Rfl: 0   torsemide (DEMADEX) 100 MG tablet, TAKE ONE TABLET BY MOUTH DAILY IN THE MORNING AND EVENING, Disp: 90 tablet, Rfl: 3   triamcinolone (KENALOG) 0.025 % ointment, Apply 1 application. topically 2 (two) times daily., Disp: 30 g, Rfl: 0   VENTOLIN HFA 108 (90 Base) MCG/ACT inhaler, Inhale 1 puff into the lungs every 4 (four) hours as needed for shortness of breath. (Patient taking differently: Inhale 2 puffs into the lungs every 4 (four) hours as needed for shortness of breath.), Disp: 6.7 g, Rfl: 1   zolpidem (AMBIEN) 10 MG tablet, Take 1 tablet (10 mg total) by mouth at  bedtime as needed for sleep., Disp: 30 tablet, Rfl: 0 Allergies  Allergen Reactions   Penicillins Other (See Comments)    Caused convulsions as a child  Did it involve swelling of the face/tongue/throat, SOB, or low BP? N/A Did it involve sudden or severe rash/hives, skin peeling, or any reaction on the inside of your mouth or nose? N/A Did you need to seek medical attention at a hospital or doctor's office?N/A When did it last happen? N/A If all above answers are "NO", may proceed with cephalosporin use.     Social History   Socioeconomic History   Marital status: Single    Spouse name: Not on file   Number of children: Not on file   Years of education: Not on file   Highest education level: Not on file  Occupational History   Not on file  Tobacco Use   Smoking status: Never   Smokeless tobacco: Never  Vaping Use   Vaping status: Never Used  Substance and Sexual Activity   Alcohol use: Not Currently   Drug use: Not Currently    Types: Marijuana    Comment: Smoked for 30 years   Sexual  activity: Not on file  Other Topics Concern   Not on file  Social History Narrative   Not on file   Social Drivers of Health   Financial Resource Strain: Medium Risk (11/15/2020)   Overall Financial Resource Strain (CARDIA)    Difficulty of Paying Living Expenses: Somewhat hard  Food Insecurity: No Food Insecurity (03/08/2022)   Hunger Vital Sign    Worried About Running Out of Food in the Last Year: Never true    Ran Out of Food in the Last Year: Never true  Transportation Needs: Unmet Transportation Needs (04/21/2022)   PRAPARE - Administrator, Civil Service (Medical): Yes    Lack of Transportation (Non-Medical): Yes  Physical Activity: Not on file  Stress: Not on file  Social Connections: Not on file  Intimate Partner Violence: Not At Risk (03/08/2022)   Humiliation, Afraid, Rape, and Kick questionnaire    Fear of Current or Ex-Partner: No    Emotionally Abused: No    Physically Abused: No    Sexually Abused: No    Physical Exam      No future appointments.

## 2023-07-08 DIAGNOSIS — I872 Venous insufficiency (chronic) (peripheral): Secondary | ICD-10-CM | POA: Diagnosis not present

## 2023-07-08 DIAGNOSIS — G8929 Other chronic pain: Secondary | ICD-10-CM | POA: Diagnosis not present

## 2023-07-08 DIAGNOSIS — M5136 Other intervertebral disc degeneration, lumbar region with discogenic back pain only: Secondary | ICD-10-CM | POA: Diagnosis not present

## 2023-07-08 DIAGNOSIS — E1151 Type 2 diabetes mellitus with diabetic peripheral angiopathy without gangrene: Secondary | ICD-10-CM | POA: Diagnosis not present

## 2023-07-08 DIAGNOSIS — J449 Chronic obstructive pulmonary disease, unspecified: Secondary | ICD-10-CM | POA: Diagnosis not present

## 2023-07-08 DIAGNOSIS — I110 Hypertensive heart disease with heart failure: Secondary | ICD-10-CM | POA: Diagnosis not present

## 2023-07-08 DIAGNOSIS — I5032 Chronic diastolic (congestive) heart failure: Secondary | ICD-10-CM | POA: Diagnosis not present

## 2023-07-08 DIAGNOSIS — L97812 Non-pressure chronic ulcer of other part of right lower leg with fat layer exposed: Secondary | ICD-10-CM | POA: Diagnosis not present

## 2023-07-08 DIAGNOSIS — I480 Paroxysmal atrial fibrillation: Secondary | ICD-10-CM | POA: Diagnosis not present

## 2023-07-12 DIAGNOSIS — E1151 Type 2 diabetes mellitus with diabetic peripheral angiopathy without gangrene: Secondary | ICD-10-CM | POA: Diagnosis not present

## 2023-07-12 DIAGNOSIS — I110 Hypertensive heart disease with heart failure: Secondary | ICD-10-CM | POA: Diagnosis not present

## 2023-07-12 DIAGNOSIS — G8929 Other chronic pain: Secondary | ICD-10-CM | POA: Diagnosis not present

## 2023-07-12 DIAGNOSIS — M5136 Other intervertebral disc degeneration, lumbar region with discogenic back pain only: Secondary | ICD-10-CM | POA: Diagnosis not present

## 2023-07-12 DIAGNOSIS — J449 Chronic obstructive pulmonary disease, unspecified: Secondary | ICD-10-CM | POA: Diagnosis not present

## 2023-07-12 DIAGNOSIS — I5032 Chronic diastolic (congestive) heart failure: Secondary | ICD-10-CM | POA: Diagnosis not present

## 2023-07-12 DIAGNOSIS — I480 Paroxysmal atrial fibrillation: Secondary | ICD-10-CM | POA: Diagnosis not present

## 2023-07-12 DIAGNOSIS — I872 Venous insufficiency (chronic) (peripheral): Secondary | ICD-10-CM | POA: Diagnosis not present

## 2023-07-12 DIAGNOSIS — L97812 Non-pressure chronic ulcer of other part of right lower leg with fat layer exposed: Secondary | ICD-10-CM | POA: Diagnosis not present

## 2023-07-15 DIAGNOSIS — I110 Hypertensive heart disease with heart failure: Secondary | ICD-10-CM | POA: Diagnosis not present

## 2023-07-15 DIAGNOSIS — G8929 Other chronic pain: Secondary | ICD-10-CM | POA: Diagnosis not present

## 2023-07-15 DIAGNOSIS — J449 Chronic obstructive pulmonary disease, unspecified: Secondary | ICD-10-CM | POA: Diagnosis not present

## 2023-07-15 DIAGNOSIS — M5136 Other intervertebral disc degeneration, lumbar region with discogenic back pain only: Secondary | ICD-10-CM | POA: Diagnosis not present

## 2023-07-15 DIAGNOSIS — I872 Venous insufficiency (chronic) (peripheral): Secondary | ICD-10-CM | POA: Diagnosis not present

## 2023-07-15 DIAGNOSIS — I5032 Chronic diastolic (congestive) heart failure: Secondary | ICD-10-CM | POA: Diagnosis not present

## 2023-07-15 DIAGNOSIS — I480 Paroxysmal atrial fibrillation: Secondary | ICD-10-CM | POA: Diagnosis not present

## 2023-07-15 DIAGNOSIS — L97812 Non-pressure chronic ulcer of other part of right lower leg with fat layer exposed: Secondary | ICD-10-CM | POA: Diagnosis not present

## 2023-07-15 DIAGNOSIS — E1151 Type 2 diabetes mellitus with diabetic peripheral angiopathy without gangrene: Secondary | ICD-10-CM | POA: Diagnosis not present

## 2023-07-19 DIAGNOSIS — J449 Chronic obstructive pulmonary disease, unspecified: Secondary | ICD-10-CM | POA: Diagnosis not present

## 2023-07-19 DIAGNOSIS — G8929 Other chronic pain: Secondary | ICD-10-CM | POA: Diagnosis not present

## 2023-07-19 DIAGNOSIS — L97812 Non-pressure chronic ulcer of other part of right lower leg with fat layer exposed: Secondary | ICD-10-CM | POA: Diagnosis not present

## 2023-07-19 DIAGNOSIS — I5032 Chronic diastolic (congestive) heart failure: Secondary | ICD-10-CM | POA: Diagnosis not present

## 2023-07-19 DIAGNOSIS — I872 Venous insufficiency (chronic) (peripheral): Secondary | ICD-10-CM | POA: Diagnosis not present

## 2023-07-19 DIAGNOSIS — E1151 Type 2 diabetes mellitus with diabetic peripheral angiopathy without gangrene: Secondary | ICD-10-CM | POA: Diagnosis not present

## 2023-07-19 DIAGNOSIS — I480 Paroxysmal atrial fibrillation: Secondary | ICD-10-CM | POA: Diagnosis not present

## 2023-07-19 DIAGNOSIS — I110 Hypertensive heart disease with heart failure: Secondary | ICD-10-CM | POA: Diagnosis not present

## 2023-07-19 DIAGNOSIS — M5136 Other intervertebral disc degeneration, lumbar region with discogenic back pain only: Secondary | ICD-10-CM | POA: Diagnosis not present

## 2023-07-20 ENCOUNTER — Other Ambulatory Visit (HOSPITAL_COMMUNITY): Payer: Self-pay

## 2023-07-20 NOTE — Progress Notes (Signed)
 Paramedicine Encounter    Patient ID: Charles Gray, male    DOB: 1955/09/17, 68 y.o.   MRN: [id number]   Complaints- chronic lower leg swelling and tenderness, general fatigue   Assessment- CAOX4, warm and dry seated in his recliner reporting to be feeling okay but having some general fatigue along with his chronic lower leg swelling. His legs are currently wrapped and were done so by [facility] RN yesterday. Lungs clear. Vitals as noted. Weight is up from last visit.    Compliance with meds- no missed doses.   Pill box filled- for two weeks   Refills needed- metoprolol   Meds changes since last visit- none     Social changes- none    VISIT SUMMARY** Arrived for home visit for Charles Gray who reports to be feeling okay seated in his recliner. His aide [name] is in the home today. Cardio Mems done and is 9 today- goal is 9. Weight is up- he admits to eating hot dogs and chilli last week and ritz crackers, juices noted in kitchen. He has been taking his medications properly. Vitals obtained as noted. Lungs clear, legs wrapped. Aide showed me pics of legs and they are swollen with some blisters noted - [facility] RN is treating same with medicated wraps. He is planning to see PCP Thursday. He needs a follow up with Shirlee Latch I plan to assist in scheduling. Meds reviewed, pill box filled for two weeks. HF education discussed. I will see Charles Gray in two weeks unless he has any concerns in the mean time. Home visit complete.   BP (!) 110/58   Pulse 62   Resp 16   Wt 293 lb (132.9 kg)   SpO2 95%   BMI 30.64 kg/m  Weight yesterday-- 293lbs  Last visit weight-- 289lbs      ACTION: Home visit completed     Patient Care Team: Nonnie Done., MD as PCP - General (Family Medicine) Laurey Morale, MD as PCP - Cardiology (Cardiology) [name], LCSW as Social Worker (Licensed Clinical Social Worker)  Patient Active Problem List   Diagnosis Date Noted   Acute on chronic heart failure with preserved  ejection fraction (HFpEF) (HCC) 03/04/2022   Dyslipidemia 07/17/2021   Lymphedema 07/08/2021   Melena    Gastric polyps    UGIB (upper gastrointestinal bleed) 04/06/2021   Acute encephalopathy 12/21/2020   COPD (chronic obstructive pulmonary disease) (HCC) 12/21/2020   Chronic diastolic (congestive) heart failure (HCC) 11/14/2020   Syncope 10/28/2020   CHF (congestive heart failure) (HCC) 10/28/2020   Chronic venous stasis dermatitis of both lower extremities 08/27/2020   Cellulitis of both lower extremities 08/14/2020   Iron deficiency anemia 08/14/2020   GERD without esophagitis 06/10/2020   Atrial fibrillation with rapid ventricular response (HCC) 03/24/2020   Acute on chronic right heart failure (HCC) 01/27/2020   Acute respiratory failure with hypoxia (HCC)    Fatty liver disease, nonalcoholic    Shock (HCC)    Personal history of DVT (deep vein thrombosis) 12/26/2019   Chronic anticoagulation 12/26/2019   Palpitations 12/26/2019   Paroxysmal atrial fibrillation (HCC) 12/23/2019   Heart failure, systolic, acute (HCC) 08/24/2019   Acute on chronic congestive heart failure (HCC)    Acute on chronic diastolic CHF (congestive heart failure) (HCC) 08/23/2019   Chest pain 08/23/2019   Lactic acidosis 08/23/2019   History of pulmonary embolism 08/12/2019   Atrial tachycardia (HCC) 08/12/2019   Occasional tremors 08/12/2019   OSA (obstructive sleep apnea) 08/12/2019  AKI (acute kidney injury) (HCC) 08/08/2019   Acute on chronic right-sided heart failure (HCC) 08/06/2019   Acute pulmonary embolism (HCC) 06/08/2019   Normocytic anemia 06/08/2019   Pulmonary emboli (HCC) 06/08/2019   Chronic heart failure with preserved ejection fraction (HCC) 05/30/2019   Class 2 severe obesity due to excess calories with serious comorbidity and body mass index (BMI) of 39.0 to 39.9 in adult [facility]) 05/30/2019   Morbid obesity with BMI of 40.0-44.9, adult (HCC) 05/30/2019   Hyponatremia    Drug  induced constipation    Peripheral edema    Hypotension due to drugs    Hypoalbuminemia due to protein-calorie malnutrition (HCC)    Thrombocytopenia (HCC)    Anemia of chronic disease    Cirrhosis of liver without ascites (HCC)    Chronic pain syndrome    Debility 03/13/2019   Portal hypertensive gastropathy (HCC) 03/06/2019   Dyspnea    GIB (gastrointestinal bleeding) 03/04/2019   Mixed hyperlipidemia 01/24/2019   Insomnia 01/24/2019   Hyperlipidemia 01/24/2019   Essential hypertension 01/24/2019   Lumbar disc herniation 01/24/2019    Current Outpatient Medications:    apixaban (ELIQUIS) 5 MG TABS tablet, Take 1 tablet (5 mg total) by mouth 2 (two) times daily., Disp: 60 tablet, Rfl: 3   Buprenorphine HCl-Naloxone HCl 8-2 MG FILM, Place 1 Film under the tongue in the morning and at bedtime., Disp: , Rfl:    cariprazine (VRAYLAR) 1.5 MG capsule, Take 1.5 mg by mouth daily. Per Dr. Orvan Falconer, Disp: , Rfl:    eplerenone (INSPRA) 50 MG tablet, Take 2 tablets (100 mg total) by mouth daily., Disp: 60 tablet, Rfl: 11   gabapentin (NEURONTIN) 800 MG tablet, Take 1 tablet (800 mg total) by mouth 3 (three) times daily., Disp: 90 tablet, Rfl: 1   lactulose, encephalopathy, (CHRONULAC) 10 GM/15ML SOLN, Take 30 mLs (20 g total) by mouth 2 (two) times daily. (Patient taking differently: Take 15 g by mouth 2 (two) times daily.), Disp: , Rfl:    metolazone (ZAROXOLYN) 2.5 MG tablet, Take 1 tablet (2.5 mg total) by mouth 3 (three) times a week. on Mondays Wednesdays and Fridays, Disp: 15 tablet, Rfl: 3   metoprolol succinate (TOPROL-XL) 25 MG 24 hr tablet, TAKE ONE TABLET BY MOUTH AT BEDTIME, Disp: 30 tablet, Rfl: 11   ondansetron (ZOFRAN-ODT) 4 MG disintegrating tablet, Take 4 mg by mouth every 8 (eight) hours as needed for nausea or vomiting (dissolve orally)., Disp: , Rfl:    pantoprazole (PROTONIX) 40 MG tablet, Take 40 mg by mouth 2 (two) times daily before a meal., Disp: , Rfl:    polyethylene  glycol (MIRALAX / GLYCOLAX) 17 g packet, Take 17 g by mouth as needed for mild constipation (mix and drink as directed)., Disp: , Rfl:    potassium chloride (KLOR-CON) 20 MEQ packet, Take 100 mEq by mouth 3 (three) times daily. Take an extra 40 mEq on days when you take metolazone, Disp: 475 packet, Rfl: 3   pravastatin (PRAVACHOL) 20 MG tablet, Take 1 tablet (20 mg total) by mouth daily., Disp: 30 tablet, Rfl: 11   sertraline (ZOLOFT) 100 MG tablet, Take 100 mg by mouth daily. At night, Disp: , Rfl:    tamsulosin (FLOMAX) 0.4 MG CAPS capsule, Take 1 capsule (0.4 mg total) by mouth daily. Additional refills will need to come from PCP, Disp: 30 capsule, Rfl: 0   torsemide (DEMADEX) 100 MG tablet, TAKE ONE TABLET BY MOUTH DAILY IN THE MORNING AND EVENING, Disp: 90  tablet, Rfl: 3   triamcinolone (KENALOG) 0.025 % ointment, Apply 1 application. topically 2 (two) times daily., Disp: 30 g, Rfl: 0   VENTOLIN HFA 108 (90 Base) MCG/ACT inhaler, Inhale 1 puff into the lungs every 4 (four) hours as needed for shortness of breath. (Patient taking differently: Inhale 2 puffs into the lungs every 4 (four) hours as needed for shortness of breath.), Disp: 6.7 g, Rfl: 1   zolpidem (AMBIEN) 10 MG tablet, Take 1 tablet (10 mg total) by mouth at bedtime as needed for sleep., Disp: 30 tablet, Rfl: 0 Allergies  Allergen Reactions   Penicillins Other (See Comments)    Caused convulsions as a child  Did it involve swelling of the face/tongue/throat, SOB, or low BP? N/A Did it involve sudden or severe rash/hives, skin peeling, or any reaction on the inside of your mouth or nose? N/A Did you need to seek medical attention at a hospital or doctor's office?N/A When did it last happen? N/A If all above answers are "NO", may proceed with cephalosporin use.     Social History   Socioeconomic History   Marital status: Single    Spouse name: Not on file   Number of children: Not on file   Years of education: Not on file    Highest education level: Not on file  Occupational History   Not on file  Tobacco Use   Smoking status: Never   Smokeless tobacco: Never  Vaping Use   Vaping status: Never Used  Substance and Sexual Activity   Alcohol use: Not Currently   Drug use: Not Currently    Types: Marijuana    Comment: Smoked for 30 years   Sexual activity: Not on file  Other Topics Concern   Not on file  Social History Narrative   Not on file   Social Drivers of Health   Financial Resource Strain: Medium Risk (11/15/2020)   Overall Financial Resource Strain (CARDIA)    Difficulty of Paying Living Expenses: Somewhat hard  Food Insecurity: No Food Insecurity (03/08/2022)   Hunger Vital Sign    Worried About Running Out of Food in the Last Year: Never true    Ran Out of Food in the Last Year: Never true  Transportation Needs: Unmet Transportation Needs (04/21/2022)   PRAPARE - Administrator, Civil Service (Medical): Yes    Lack of Transportation (Non-Medical): Yes  Physical Activity: Not on file  Stress: Not on file  Social Connections: Not on file  Intimate Partner Violence: Not At Risk (03/08/2022)   Humiliation, Afraid, Rape, and Kick questionnaire    Fear of Current or Ex-Partner: No    Emotionally Abused: No    Physically Abused: No    Sexually Abused: No    Physical Exam      No future appointments.

## 2023-07-22 DIAGNOSIS — J449 Chronic obstructive pulmonary disease, unspecified: Secondary | ICD-10-CM | POA: Diagnosis not present

## 2023-07-22 DIAGNOSIS — F411 Generalized anxiety disorder: Secondary | ICD-10-CM | POA: Diagnosis not present

## 2023-07-22 DIAGNOSIS — R32 Unspecified urinary incontinence: Secondary | ICD-10-CM | POA: Diagnosis not present

## 2023-07-22 DIAGNOSIS — I5032 Chronic diastolic (congestive) heart failure: Secondary | ICD-10-CM | POA: Diagnosis not present

## 2023-07-22 DIAGNOSIS — L97812 Non-pressure chronic ulcer of other part of right lower leg with fat layer exposed: Secondary | ICD-10-CM | POA: Diagnosis not present

## 2023-07-22 DIAGNOSIS — F1120 Opioid dependence, uncomplicated: Secondary | ICD-10-CM | POA: Diagnosis not present

## 2023-07-22 DIAGNOSIS — I480 Paroxysmal atrial fibrillation: Secondary | ICD-10-CM | POA: Diagnosis not present

## 2023-07-22 DIAGNOSIS — G8929 Other chronic pain: Secondary | ICD-10-CM | POA: Diagnosis not present

## 2023-07-22 DIAGNOSIS — I872 Venous insufficiency (chronic) (peripheral): Secondary | ICD-10-CM | POA: Diagnosis not present

## 2023-07-22 DIAGNOSIS — E1151 Type 2 diabetes mellitus with diabetic peripheral angiopathy without gangrene: Secondary | ICD-10-CM | POA: Diagnosis not present

## 2023-07-22 DIAGNOSIS — F332 Major depressive disorder, recurrent severe without psychotic features: Secondary | ICD-10-CM | POA: Diagnosis not present

## 2023-07-22 DIAGNOSIS — M5136 Other intervertebral disc degeneration, lumbar region with discogenic back pain only: Secondary | ICD-10-CM | POA: Diagnosis not present

## 2023-07-22 DIAGNOSIS — I110 Hypertensive heart disease with heart failure: Secondary | ICD-10-CM | POA: Diagnosis not present

## 2023-07-26 DIAGNOSIS — I480 Paroxysmal atrial fibrillation: Secondary | ICD-10-CM | POA: Diagnosis not present

## 2023-07-26 DIAGNOSIS — I5032 Chronic diastolic (congestive) heart failure: Secondary | ICD-10-CM | POA: Diagnosis not present

## 2023-07-26 DIAGNOSIS — I110 Hypertensive heart disease with heart failure: Secondary | ICD-10-CM | POA: Diagnosis not present

## 2023-07-26 DIAGNOSIS — M5136 Other intervertebral disc degeneration, lumbar region with discogenic back pain only: Secondary | ICD-10-CM | POA: Diagnosis not present

## 2023-07-26 DIAGNOSIS — G8929 Other chronic pain: Secondary | ICD-10-CM | POA: Diagnosis not present

## 2023-07-26 DIAGNOSIS — J449 Chronic obstructive pulmonary disease, unspecified: Secondary | ICD-10-CM | POA: Diagnosis not present

## 2023-07-26 DIAGNOSIS — E1151 Type 2 diabetes mellitus with diabetic peripheral angiopathy without gangrene: Secondary | ICD-10-CM | POA: Diagnosis not present

## 2023-07-26 DIAGNOSIS — L97812 Non-pressure chronic ulcer of other part of right lower leg with fat layer exposed: Secondary | ICD-10-CM | POA: Diagnosis not present

## 2023-07-26 DIAGNOSIS — I872 Venous insufficiency (chronic) (peripheral): Secondary | ICD-10-CM | POA: Diagnosis not present

## 2023-07-30 DIAGNOSIS — M5136 Other intervertebral disc degeneration, lumbar region with discogenic back pain only: Secondary | ICD-10-CM | POA: Diagnosis not present

## 2023-07-30 DIAGNOSIS — L97812 Non-pressure chronic ulcer of other part of right lower leg with fat layer exposed: Secondary | ICD-10-CM | POA: Diagnosis not present

## 2023-07-30 DIAGNOSIS — J449 Chronic obstructive pulmonary disease, unspecified: Secondary | ICD-10-CM | POA: Diagnosis not present

## 2023-07-30 DIAGNOSIS — I480 Paroxysmal atrial fibrillation: Secondary | ICD-10-CM | POA: Diagnosis not present

## 2023-07-30 DIAGNOSIS — E1151 Type 2 diabetes mellitus with diabetic peripheral angiopathy without gangrene: Secondary | ICD-10-CM | POA: Diagnosis not present

## 2023-07-30 DIAGNOSIS — G8929 Other chronic pain: Secondary | ICD-10-CM | POA: Diagnosis not present

## 2023-07-30 DIAGNOSIS — I110 Hypertensive heart disease with heart failure: Secondary | ICD-10-CM | POA: Diagnosis not present

## 2023-07-30 DIAGNOSIS — I5032 Chronic diastolic (congestive) heart failure: Secondary | ICD-10-CM | POA: Diagnosis not present

## 2023-07-30 DIAGNOSIS — I872 Venous insufficiency (chronic) (peripheral): Secondary | ICD-10-CM | POA: Diagnosis not present

## 2023-08-02 DIAGNOSIS — I110 Hypertensive heart disease with heart failure: Secondary | ICD-10-CM | POA: Diagnosis not present

## 2023-08-02 DIAGNOSIS — M5136 Other intervertebral disc degeneration, lumbar region with discogenic back pain only: Secondary | ICD-10-CM | POA: Diagnosis not present

## 2023-08-02 DIAGNOSIS — E1151 Type 2 diabetes mellitus with diabetic peripheral angiopathy without gangrene: Secondary | ICD-10-CM | POA: Diagnosis not present

## 2023-08-02 DIAGNOSIS — G8929 Other chronic pain: Secondary | ICD-10-CM | POA: Diagnosis not present

## 2023-08-02 DIAGNOSIS — I5032 Chronic diastolic (congestive) heart failure: Secondary | ICD-10-CM | POA: Diagnosis not present

## 2023-08-02 DIAGNOSIS — I872 Venous insufficiency (chronic) (peripheral): Secondary | ICD-10-CM | POA: Diagnosis not present

## 2023-08-02 DIAGNOSIS — I480 Paroxysmal atrial fibrillation: Secondary | ICD-10-CM | POA: Diagnosis not present

## 2023-08-02 DIAGNOSIS — J449 Chronic obstructive pulmonary disease, unspecified: Secondary | ICD-10-CM | POA: Diagnosis not present

## 2023-08-02 DIAGNOSIS — L97812 Non-pressure chronic ulcer of other part of right lower leg with fat layer exposed: Secondary | ICD-10-CM | POA: Diagnosis not present

## 2023-08-03 ENCOUNTER — Other Ambulatory Visit (HOSPITAL_COMMUNITY): Payer: Self-pay

## 2023-08-03 NOTE — Progress Notes (Signed)
 Paramedicine Encounter    Patient ID: Charles Gray, male    DOB: Jan 28, 1956, 68 y.o.   MRN: [id number]   Complaints- decreased mobility, increased urination on himself, leg/feet swelling, "heart skipping beats"  Assessment- CAOx4, warm and dry, seated in recliner, lungs clear, legs wrapped by [facility] RN yesterday, feet swollen, vitals as noted, HR regularly irregular. Cardio mems is 5 today with a goal of 9.   Compliance with meds- pill boxes empty-says he is cutting his metolazones sometimes in half. Reports taking his potassium.   Pill box filled- two weeks   Refills needed-  Vraylar Inspra    Meds changes since last visit- none     Social changes- none    VISIT SUMMARY- Arrived for home visit for Charles Gray who reports to be feeling sick to his stomach with nausea, he denied any vomiting or diarrhea. He says he took his pills on an empty stomach- I educated him importance of eating a small meal when taking his meds. I obtained vitals and assessment. Lungs clear, lower legs wrapped. Feet swollen. He reports he has been having frequent urination and not making it to the bathroom and he ends up urinating on himself. I advised him I could bring him a portable urinal to help with this. He has decreased mobility due to deconditioning. He is not exercising at all and is only walking to the bedroom- bathroom - kitchen areas. I reviewed HF education with him and reviewed meds filling pill box for two weeks. Refills as noted will be called into [facility]. Home visit complete. I will see Charles Gray in two weeks.   BP 102/60   Pulse 74   Wt 291 lb (132 kg)   SpO2 98%   BMI 30.43 kg/m  Weight yesterday-- 293lbs Last visit weight-- 291lbs      ACTION: Home visit completed     Patient Care Team: Lucius Sabins., MD as PCP - General (Family Medicine) Darlis Eisenmenger, MD as PCP - Cardiology (Cardiology) Denton Flakes, LCSW as Social Worker (Licensed Clinical Social Worker)  Patient Active  Problem List   Diagnosis Date Noted   Acute on chronic heart failure with preserved ejection fraction (HFpEF) (HCC) 03/04/2022   Dyslipidemia 07/17/2021   Lymphedema 07/08/2021   Melena    Gastric polyps    UGIB (upper gastrointestinal bleed) 04/06/2021   Acute encephalopathy 12/21/2020   COPD (chronic obstructive pulmonary disease) (HCC) 12/21/2020   Chronic diastolic (congestive) heart failure (HCC) 11/14/2020   Syncope 10/28/2020   CHF (congestive heart failure) (HCC) 10/28/2020   Chronic venous stasis dermatitis of both lower extremities 08/27/2020   Cellulitis of both lower extremities 08/14/2020   Iron deficiency anemia 08/14/2020   GERD without esophagitis 06/10/2020   Atrial fibrillation with rapid ventricular response (HCC) 03/24/2020   Acute on chronic right heart failure (HCC) 01/27/2020   Acute respiratory failure with hypoxia (HCC)    Fatty liver disease, nonalcoholic    Shock (HCC)    Personal history of DVT (deep vein thrombosis) 12/26/2019   Chronic anticoagulation 12/26/2019   Palpitations 12/26/2019   Paroxysmal atrial fibrillation (HCC) 12/23/2019   Heart failure, systolic, acute (HCC) 08/24/2019   Acute on chronic congestive heart failure (HCC)    Acute on chronic diastolic CHF (congestive heart failure) (HCC) 08/23/2019   Chest pain 08/23/2019   Lactic acidosis 08/23/2019   History of pulmonary embolism 08/12/2019   Atrial tachycardia (HCC) 08/12/2019   Occasional tremors 08/12/2019   OSA (obstructive sleep apnea)  08/12/2019   AKI (acute kidney injury) (HCC) 08/08/2019   Acute on chronic right-sided heart failure (HCC) 08/06/2019   Acute pulmonary embolism (HCC) 06/08/2019   Normocytic anemia 06/08/2019   Pulmonary emboli (HCC) 06/08/2019   Chronic heart failure with preserved ejection fraction (HCC) 05/30/2019   Class 2 severe obesity due to excess calories with serious comorbidity and body mass index (BMI) of 39.0 to 39.9 in adult [facility]) 05/30/2019    Morbid obesity with BMI of 40.0-44.9, adult (HCC) 05/30/2019   Hyponatremia    Drug induced constipation    Peripheral edema    Hypotension due to drugs    Hypoalbuminemia due to protein-calorie malnutrition (HCC)    Thrombocytopenia (HCC)    Anemia of chronic disease    Cirrhosis of liver without ascites (HCC)    Chronic pain syndrome    Debility 03/13/2019   Portal hypertensive gastropathy (HCC) 03/06/2019   Dyspnea    GIB (gastrointestinal bleeding) 03/04/2019   Mixed hyperlipidemia 01/24/2019   Insomnia 01/24/2019   Hyperlipidemia 01/24/2019   Essential hypertension 01/24/2019   Lumbar disc herniation 01/24/2019    Current Outpatient Medications:    apixaban  (ELIQUIS ) 5 MG TABS tablet, Take 1 tablet (5 mg total) by mouth 2 (two) times daily., Disp: 60 tablet, Rfl: 3   Buprenorphine  HCl-Naloxone  HCl 8-2 MG FILM, Place 1 Film under the tongue in the morning and at bedtime., Disp: , Rfl:    cariprazine (VRAYLAR) 1.5 MG capsule, Take 1.5 mg by mouth daily. Per Dr. Daina Drum, Disp: , Rfl:    eplerenone  (INSPRA ) 50 MG tablet, Take 2 tablets (100 mg total) by mouth daily., Disp: 60 tablet, Rfl: 11   gabapentin  (NEURONTIN ) 800 MG tablet, Take 1 tablet (800 mg total) by mouth 3 (three) times daily., Disp: 90 tablet, Rfl: 1   lactulose , encephalopathy, (CHRONULAC ) 10 GM/15ML SOLN, Take 30 mLs (20 g total) by mouth 2 (two) times daily. (Patient taking differently: Take 15 g by mouth 2 (two) times daily.), Disp: , Rfl:    metolazone  (ZAROXOLYN ) 2.5 MG tablet, Take 1 tablet (2.5 mg total) by mouth 3 (three) times a week. on Mondays Wednesdays and Fridays, Disp: 15 tablet, Rfl: 3   metoprolol  succinate (TOPROL -XL) 25 MG 24 hr tablet, TAKE ONE TABLET BY MOUTH AT BEDTIME, Disp: 30 tablet, Rfl: 11   ondansetron  (ZOFRAN -ODT) 4 MG disintegrating tablet, Take 4 mg by mouth every 8 (eight) hours as needed for nausea or vomiting (dissolve orally)., Disp: , Rfl:    pantoprazole  (PROTONIX ) 40 MG tablet,  Take 40 mg by mouth 2 (two) times daily before a meal., Disp: , Rfl:    polyethylene glycol (MIRALAX  / GLYCOLAX ) 17 g packet, Take 17 g by mouth as needed for mild constipation (mix and drink as directed)., Disp: , Rfl:    potassium chloride  (KLOR-CON ) 20 MEQ packet, Take 100 mEq by mouth 3 (three) times daily. Take an extra 40 mEq on days when you take metolazone , Disp: 475 packet, Rfl: 3   pravastatin  (PRAVACHOL ) 20 MG tablet, Take 1 tablet (20 mg total) by mouth daily., Disp: 30 tablet, Rfl: 11   sertraline  (ZOLOFT ) 100 MG tablet, Take 100 mg by mouth daily. At night, Disp: , Rfl:    tamsulosin  (FLOMAX ) 0.4 MG CAPS capsule, Take 1 capsule (0.4 mg total) by mouth daily. Additional refills will need to come from PCP, Disp: 30 capsule, Rfl: 0   torsemide  (DEMADEX ) 100 MG tablet, TAKE ONE TABLET BY MOUTH DAILY IN THE MORNING AND  EVENING, Disp: 90 tablet, Rfl: 3   triamcinolone  (KENALOG ) 0.025 % ointment, Apply 1 application. topically 2 (two) times daily., Disp: 30 g, Rfl: 0   VENTOLIN  HFA 108 (90 Base) MCG/ACT inhaler, Inhale 1 puff into the lungs every 4 (four) hours as needed for shortness of breath. (Patient taking differently: Inhale 2 puffs into the lungs every 4 (four) hours as needed for shortness of breath.), Disp: 6.7 g, Rfl: 1   zolpidem  (AMBIEN ) 10 MG tablet, Take 1 tablet (10 mg total) by mouth at bedtime as needed for sleep., Disp: 30 tablet, Rfl: 0 Allergies  Allergen Reactions   Penicillins Other (See Comments)    Caused convulsions as a child  Did it involve swelling of the face/tongue/throat, SOB, or low BP? N/A Did it involve sudden or severe rash/hives, skin peeling, or any reaction on the inside of your mouth or nose? N/A Did you need to seek medical attention at a hospital or doctor's office?N/A When did it last happen? N/A If all above answers are "NO", may proceed with cephalosporin use.     Social History   Socioeconomic History   Marital status: Single    Spouse  name: Not on file   Number of children: Not on file   Years of education: Not on file   Highest education level: Not on file  Occupational History   Not on file  Tobacco Use   Smoking status: Never   Smokeless tobacco: Never  Vaping Use   Vaping status: Never Used  Substance and Sexual Activity   Alcohol use: Not Currently   Drug use: Not Currently    Types: Marijuana    Comment: Smoked for 30 years   Sexual activity: Not on file  Other Topics Concern   Not on file  Social History Narrative   Not on file   Social Drivers of Health   Financial Resource Strain: Medium Risk (11/15/2020)   Overall Financial Resource Strain (CARDIA)    Difficulty of Paying Living Expenses: Somewhat hard  Food Insecurity: No Food Insecurity (03/08/2022)   Hunger Vital Sign    Worried About Running Out of Food in the Last Year: Never true    Ran Out of Food in the Last Year: Never true  Transportation Needs: Unmet Transportation Needs (04/21/2022)   PRAPARE - Administrator, Civil Service (Medical): Yes    Lack of Transportation (Non-Medical): Yes  Physical Activity: Not on file  Stress: Not on file  Social Connections: Not on file  Intimate Partner Violence: Not At Risk (03/08/2022)   Humiliation, Afraid, Rape, and Kick questionnaire    Fear of Current or Ex-Partner: No    Emotionally Abused: No    Physically Abused: No    Sexually Abused: No    Physical Exam      No future appointments.

## 2023-08-05 DIAGNOSIS — I5032 Chronic diastolic (congestive) heart failure: Secondary | ICD-10-CM | POA: Diagnosis not present

## 2023-08-05 DIAGNOSIS — M5136 Other intervertebral disc degeneration, lumbar region with discogenic back pain only: Secondary | ICD-10-CM | POA: Diagnosis not present

## 2023-08-05 DIAGNOSIS — I480 Paroxysmal atrial fibrillation: Secondary | ICD-10-CM | POA: Diagnosis not present

## 2023-08-05 DIAGNOSIS — E1151 Type 2 diabetes mellitus with diabetic peripheral angiopathy without gangrene: Secondary | ICD-10-CM | POA: Diagnosis not present

## 2023-08-05 DIAGNOSIS — G8929 Other chronic pain: Secondary | ICD-10-CM | POA: Diagnosis not present

## 2023-08-05 DIAGNOSIS — I110 Hypertensive heart disease with heart failure: Secondary | ICD-10-CM | POA: Diagnosis not present

## 2023-08-05 DIAGNOSIS — I872 Venous insufficiency (chronic) (peripheral): Secondary | ICD-10-CM | POA: Diagnosis not present

## 2023-08-05 DIAGNOSIS — J449 Chronic obstructive pulmonary disease, unspecified: Secondary | ICD-10-CM | POA: Diagnosis not present

## 2023-08-05 DIAGNOSIS — L97812 Non-pressure chronic ulcer of other part of right lower leg with fat layer exposed: Secondary | ICD-10-CM | POA: Diagnosis not present

## 2023-08-09 DIAGNOSIS — I110 Hypertensive heart disease with heart failure: Secondary | ICD-10-CM | POA: Diagnosis not present

## 2023-08-09 DIAGNOSIS — I872 Venous insufficiency (chronic) (peripheral): Secondary | ICD-10-CM | POA: Diagnosis not present

## 2023-08-09 DIAGNOSIS — G8929 Other chronic pain: Secondary | ICD-10-CM | POA: Diagnosis not present

## 2023-08-09 DIAGNOSIS — I480 Paroxysmal atrial fibrillation: Secondary | ICD-10-CM | POA: Diagnosis not present

## 2023-08-09 DIAGNOSIS — L97812 Non-pressure chronic ulcer of other part of right lower leg with fat layer exposed: Secondary | ICD-10-CM | POA: Diagnosis not present

## 2023-08-09 DIAGNOSIS — J449 Chronic obstructive pulmonary disease, unspecified: Secondary | ICD-10-CM | POA: Diagnosis not present

## 2023-08-09 DIAGNOSIS — M5136 Other intervertebral disc degeneration, lumbar region with discogenic back pain only: Secondary | ICD-10-CM | POA: Diagnosis not present

## 2023-08-09 DIAGNOSIS — I5032 Chronic diastolic (congestive) heart failure: Secondary | ICD-10-CM | POA: Diagnosis not present

## 2023-08-09 DIAGNOSIS — E1151 Type 2 diabetes mellitus with diabetic peripheral angiopathy without gangrene: Secondary | ICD-10-CM | POA: Diagnosis not present

## 2023-08-12 DIAGNOSIS — E1151 Type 2 diabetes mellitus with diabetic peripheral angiopathy without gangrene: Secondary | ICD-10-CM | POA: Diagnosis not present

## 2023-08-12 DIAGNOSIS — I5032 Chronic diastolic (congestive) heart failure: Secondary | ICD-10-CM | POA: Diagnosis not present

## 2023-08-12 DIAGNOSIS — L97812 Non-pressure chronic ulcer of other part of right lower leg with fat layer exposed: Secondary | ICD-10-CM | POA: Diagnosis not present

## 2023-08-12 DIAGNOSIS — I872 Venous insufficiency (chronic) (peripheral): Secondary | ICD-10-CM | POA: Diagnosis not present

## 2023-08-12 DIAGNOSIS — M5136 Other intervertebral disc degeneration, lumbar region with discogenic back pain only: Secondary | ICD-10-CM | POA: Diagnosis not present

## 2023-08-12 DIAGNOSIS — R32 Unspecified urinary incontinence: Secondary | ICD-10-CM | POA: Diagnosis not present

## 2023-08-12 DIAGNOSIS — J449 Chronic obstructive pulmonary disease, unspecified: Secondary | ICD-10-CM | POA: Diagnosis not present

## 2023-08-12 DIAGNOSIS — I110 Hypertensive heart disease with heart failure: Secondary | ICD-10-CM | POA: Diagnosis not present

## 2023-08-12 DIAGNOSIS — G8929 Other chronic pain: Secondary | ICD-10-CM | POA: Diagnosis not present

## 2023-08-12 DIAGNOSIS — I480 Paroxysmal atrial fibrillation: Secondary | ICD-10-CM | POA: Diagnosis not present

## 2023-08-16 DIAGNOSIS — J449 Chronic obstructive pulmonary disease, unspecified: Secondary | ICD-10-CM | POA: Diagnosis not present

## 2023-08-16 DIAGNOSIS — M5136 Other intervertebral disc degeneration, lumbar region with discogenic back pain only: Secondary | ICD-10-CM | POA: Diagnosis not present

## 2023-08-16 DIAGNOSIS — E1151 Type 2 diabetes mellitus with diabetic peripheral angiopathy without gangrene: Secondary | ICD-10-CM | POA: Diagnosis not present

## 2023-08-16 DIAGNOSIS — I872 Venous insufficiency (chronic) (peripheral): Secondary | ICD-10-CM | POA: Diagnosis not present

## 2023-08-16 DIAGNOSIS — I110 Hypertensive heart disease with heart failure: Secondary | ICD-10-CM | POA: Diagnosis not present

## 2023-08-16 DIAGNOSIS — I5032 Chronic diastolic (congestive) heart failure: Secondary | ICD-10-CM | POA: Diagnosis not present

## 2023-08-16 DIAGNOSIS — G8929 Other chronic pain: Secondary | ICD-10-CM | POA: Diagnosis not present

## 2023-08-16 DIAGNOSIS — I480 Paroxysmal atrial fibrillation: Secondary | ICD-10-CM | POA: Diagnosis not present

## 2023-08-16 DIAGNOSIS — L97812 Non-pressure chronic ulcer of other part of right lower leg with fat layer exposed: Secondary | ICD-10-CM | POA: Diagnosis not present

## 2023-08-17 ENCOUNTER — Other Ambulatory Visit (HOSPITAL_COMMUNITY): Payer: Self-pay

## 2023-08-17 NOTE — Progress Notes (Signed)
 Paramedicine Encounter    Patient ID: Charles Gray, male    DOB: 1955/11/20, 68 y.o.   MRN: [id number]   Complaints- general fatigue and shortness of breath, trouble swallowing since last week- says it seems positional   Assessment- CAOx4, warm and dry seated in his recliner, lungs clear, vitals obtained as noted. Legs wrapped yesterday by [facility] RN.    Compliance with meds- no missed doses   Pill box filled- two weeks   Refills needed- metoprolol , sertraline    Meds changes since last visit- none     Social changes- none    VISIT SUMMARY- Arrived for home visit for Charles Gray who reports to be feeling okay just complaining of general fatigue and shortness of breath on exertion. His legs are wrapped by [facility] RN. He says he is still having issues urinating on himself from not getting to the bathroom quick enough- he says he might talk to his PCP about a condom catheter- I offered a bed side commode for his living room and he declined. He is using pull ups for now and trying to get to the bathroom as quick as he can. I expressed the importance of exercise and trying to gain some strength and mobility back. I obtained vitals and assessment as noted. I reviewed meds and confirmed same filling pill box for two weeks. Refills as noted will be called in next week. Mems is 8 today. 9 yesterday. HF education provided. Home visit complete. I will see Charles Gray in two weeks.   BP 122/68   Pulse 78   Resp 16   Wt 294 lb (133.4 kg)   SpO2 99%   BMI 30.74 kg/m  Weight yesterday-- 292lbs  Last visit weight-- 291lbs      ACTION: Home visit completed     Patient Care Team: Lucius Sabins., MD as PCP - General (Family Medicine) Darlis Eisenmenger, MD as PCP - Cardiology (Cardiology) Denton Flakes, LCSW as Social Worker (Licensed Clinical Social Worker)  Patient Active Problem List   Diagnosis Date Noted   Acute on chronic heart failure with preserved ejection fraction (HFpEF) (HCC) 03/04/2022    Dyslipidemia 07/17/2021   Lymphedema 07/08/2021   Melena    Gastric polyps    UGIB (upper gastrointestinal bleed) 04/06/2021   Acute encephalopathy 12/21/2020   COPD (chronic obstructive pulmonary disease) (HCC) 12/21/2020   Chronic diastolic (congestive) heart failure (HCC) 11/14/2020   Syncope 10/28/2020   CHF (congestive heart failure) (HCC) 10/28/2020   Chronic venous stasis dermatitis of both lower extremities 08/27/2020   Cellulitis of both lower extremities 08/14/2020   Iron deficiency anemia 08/14/2020   GERD without esophagitis 06/10/2020   Atrial fibrillation with rapid ventricular response (HCC) 03/24/2020   Acute on chronic right heart failure (HCC) 01/27/2020   Acute respiratory failure with hypoxia (HCC)    Fatty liver disease, nonalcoholic    Shock (HCC)    Personal history of DVT (deep vein thrombosis) 12/26/2019   Chronic anticoagulation 12/26/2019   Palpitations 12/26/2019   Paroxysmal atrial fibrillation (HCC) 12/23/2019   Heart failure, systolic, acute (HCC) 08/24/2019   Acute on chronic congestive heart failure (HCC)    Acute on chronic diastolic CHF (congestive heart failure) (HCC) 08/23/2019   Chest pain 08/23/2019   Lactic acidosis 08/23/2019   History of pulmonary embolism 08/12/2019   Atrial tachycardia (HCC) 08/12/2019   Occasional tremors 08/12/2019   OSA (obstructive sleep apnea) 08/12/2019   AKI (acute kidney injury) (HCC) 08/08/2019   Acute on  chronic right-sided heart failure (HCC) 08/06/2019   Acute pulmonary embolism (HCC) 06/08/2019   Normocytic anemia 06/08/2019   Pulmonary emboli (HCC) 06/08/2019   Chronic heart failure with preserved ejection fraction (HCC) 05/30/2019   Class 2 severe obesity due to excess calories with serious comorbidity and body mass index (BMI) of 39.0 to 39.9 in adult [facility]) 05/30/2019   Morbid obesity with BMI of 40.0-44.9, adult (HCC) 05/30/2019   Hyponatremia    Drug induced constipation    Peripheral edema     Hypotension due to drugs    Hypoalbuminemia due to protein-calorie malnutrition (HCC)    Thrombocytopenia (HCC)    Anemia of chronic disease    Cirrhosis of liver without ascites (HCC)    Chronic pain syndrome    Debility 03/13/2019   Portal hypertensive gastropathy (HCC) 03/06/2019   Dyspnea    GIB (gastrointestinal bleeding) 03/04/2019   Mixed hyperlipidemia 01/24/2019   Insomnia 01/24/2019   Hyperlipidemia 01/24/2019   Essential hypertension 01/24/2019   Lumbar disc herniation 01/24/2019    Current Outpatient Medications:    apixaban  (ELIQUIS ) 5 MG TABS tablet, Take 1 tablet (5 mg total) by mouth 2 (two) times daily., Disp: 60 tablet, Rfl: 3   Buprenorphine  HCl-Naloxone  HCl 8-2 MG FILM, Place 1 Film under the tongue in the morning and at bedtime., Disp: , Rfl:    cariprazine (VRAYLAR) 1.5 MG capsule, Take 1.5 mg by mouth daily. Per Dr. Daina Drum, Disp: , Rfl:    eplerenone  (INSPRA ) 50 MG tablet, Take 2 tablets (100 mg total) by mouth daily., Disp: 60 tablet, Rfl: 11   gabapentin  (NEURONTIN ) 800 MG tablet, Take 1 tablet (800 mg total) by mouth 3 (three) times daily., Disp: 90 tablet, Rfl: 1   lactulose , encephalopathy, (CHRONULAC ) 10 GM/15ML SOLN, Take 30 mLs (20 g total) by mouth 2 (two) times daily. (Patient taking differently: Take 15 g by mouth 2 (two) times daily.), Disp: , Rfl:    metolazone  (ZAROXOLYN ) 2.5 MG tablet, Take 1 tablet (2.5 mg total) by mouth 3 (three) times a week. on Mondays Wednesdays and Fridays, Disp: 15 tablet, Rfl: 3   metoprolol  succinate (TOPROL -XL) 25 MG 24 hr tablet, TAKE ONE TABLET BY MOUTH AT BEDTIME, Disp: 30 tablet, Rfl: 11   ondansetron  (ZOFRAN -ODT) 4 MG disintegrating tablet, Take 4 mg by mouth every 8 (eight) hours as needed for nausea or vomiting (dissolve orally)., Disp: , Rfl:    pantoprazole  (PROTONIX ) 40 MG tablet, Take 40 mg by mouth 2 (two) times daily before a meal., Disp: , Rfl:    polyethylene glycol (MIRALAX  / GLYCOLAX ) 17 g packet, Take 17  g by mouth as needed for mild constipation (mix and drink as directed)., Disp: , Rfl:    potassium chloride  (KLOR-CON ) 20 MEQ packet, Take 100 mEq by mouth 3 (three) times daily. Take an extra 40 mEq on days when you take metolazone , Disp: 475 packet, Rfl: 3   pravastatin  (PRAVACHOL ) 20 MG tablet, Take 1 tablet (20 mg total) by mouth daily., Disp: 30 tablet, Rfl: 11   sertraline  (ZOLOFT ) 100 MG tablet, Take 100 mg by mouth daily. At night, Disp: , Rfl:    tamsulosin  (FLOMAX ) 0.4 MG CAPS capsule, Take 1 capsule (0.4 mg total) by mouth daily. Additional refills will need to come from PCP, Disp: 30 capsule, Rfl: 0   torsemide  (DEMADEX ) 100 MG tablet, TAKE ONE TABLET BY MOUTH DAILY IN THE MORNING AND EVENING, Disp: 90 tablet, Rfl: 3   triamcinolone  (KENALOG ) 0.025 % ointment,  Apply 1 application. topically 2 (two) times daily., Disp: 30 g, Rfl: 0   VENTOLIN  HFA 108 (90 Base) MCG/ACT inhaler, Inhale 1 puff into the lungs every 4 (four) hours as needed for shortness of breath. (Patient taking differently: Inhale 2 puffs into the lungs every 4 (four) hours as needed for shortness of breath.), Disp: 6.7 g, Rfl: 1   zolpidem  (AMBIEN ) 10 MG tablet, Take 1 tablet (10 mg total) by mouth at bedtime as needed for sleep., Disp: 30 tablet, Rfl: 0 Allergies  Allergen Reactions   Penicillins Other (See Comments)    Caused convulsions as a child  Did it involve swelling of the face/tongue/throat, SOB, or low BP? N/A Did it involve sudden or severe rash/hives, skin peeling, or any reaction on the inside of your mouth or nose? N/A Did you need to seek medical attention at a hospital or doctor's office?N/A When did it last happen? N/A If all above answers are "NO", may proceed with cephalosporin use.     Social History   Socioeconomic History   Marital status: Single    Spouse name: Not on file   Number of children: Not on file   Years of education: Not on file   Highest education level: Not on file   Occupational History   Not on file  Tobacco Use   Smoking status: Never   Smokeless tobacco: Never  Vaping Use   Vaping status: Never Used  Substance and Sexual Activity   Alcohol use: Not Currently   Drug use: Not Currently    Types: Marijuana    Comment: Smoked for 30 years   Sexual activity: Not on file  Other Topics Concern   Not on file  Social History Narrative   Not on file   Social Drivers of Health   Financial Resource Strain: Medium Risk (11/15/2020)   Overall Financial Resource Strain (CARDIA)    Difficulty of Paying Living Expenses: Somewhat hard  Food Insecurity: No Food Insecurity (03/08/2022)   Hunger Vital Sign    Worried About Running Out of Food in the Last Year: Never true    Ran Out of Food in the Last Year: Never true  Transportation Needs: Unmet Transportation Needs (04/21/2022)   PRAPARE - Administrator, Civil Service (Medical): Yes    Lack of Transportation (Non-Medical): Yes  Physical Activity: Not on file  Stress: Not on file  Social Connections: Not on file  Intimate Partner Violence: Not At Risk (03/08/2022)   Humiliation, Afraid, Rape, and Kick questionnaire    Fear of Current or Ex-Partner: No    Emotionally Abused: No    Physically Abused: No    Sexually Abused: No    Physical Exam      No future appointments.

## 2023-08-19 DIAGNOSIS — I110 Hypertensive heart disease with heart failure: Secondary | ICD-10-CM | POA: Diagnosis not present

## 2023-08-19 DIAGNOSIS — E1151 Type 2 diabetes mellitus with diabetic peripheral angiopathy without gangrene: Secondary | ICD-10-CM | POA: Diagnosis not present

## 2023-08-19 DIAGNOSIS — J449 Chronic obstructive pulmonary disease, unspecified: Secondary | ICD-10-CM | POA: Diagnosis not present

## 2023-08-19 DIAGNOSIS — L97812 Non-pressure chronic ulcer of other part of right lower leg with fat layer exposed: Secondary | ICD-10-CM | POA: Diagnosis not present

## 2023-08-19 DIAGNOSIS — I5032 Chronic diastolic (congestive) heart failure: Secondary | ICD-10-CM | POA: Diagnosis not present

## 2023-08-19 DIAGNOSIS — I872 Venous insufficiency (chronic) (peripheral): Secondary | ICD-10-CM | POA: Diagnosis not present

## 2023-08-19 DIAGNOSIS — M5136 Other intervertebral disc degeneration, lumbar region with discogenic back pain only: Secondary | ICD-10-CM | POA: Diagnosis not present

## 2023-08-19 DIAGNOSIS — I480 Paroxysmal atrial fibrillation: Secondary | ICD-10-CM | POA: Diagnosis not present

## 2023-08-19 DIAGNOSIS — G8929 Other chronic pain: Secondary | ICD-10-CM | POA: Diagnosis not present

## 2023-08-23 DIAGNOSIS — I872 Venous insufficiency (chronic) (peripheral): Secondary | ICD-10-CM | POA: Diagnosis not present

## 2023-08-23 DIAGNOSIS — E1151 Type 2 diabetes mellitus with diabetic peripheral angiopathy without gangrene: Secondary | ICD-10-CM | POA: Diagnosis not present

## 2023-08-23 DIAGNOSIS — L97812 Non-pressure chronic ulcer of other part of right lower leg with fat layer exposed: Secondary | ICD-10-CM | POA: Diagnosis not present

## 2023-08-23 DIAGNOSIS — G8929 Other chronic pain: Secondary | ICD-10-CM | POA: Diagnosis not present

## 2023-08-23 DIAGNOSIS — M5136 Other intervertebral disc degeneration, lumbar region with discogenic back pain only: Secondary | ICD-10-CM | POA: Diagnosis not present

## 2023-08-23 DIAGNOSIS — I480 Paroxysmal atrial fibrillation: Secondary | ICD-10-CM | POA: Diagnosis not present

## 2023-08-23 DIAGNOSIS — I5032 Chronic diastolic (congestive) heart failure: Secondary | ICD-10-CM | POA: Diagnosis not present

## 2023-08-23 DIAGNOSIS — J449 Chronic obstructive pulmonary disease, unspecified: Secondary | ICD-10-CM | POA: Diagnosis not present

## 2023-08-23 DIAGNOSIS — I110 Hypertensive heart disease with heart failure: Secondary | ICD-10-CM | POA: Diagnosis not present

## 2023-08-26 ENCOUNTER — Telehealth (HOSPITAL_COMMUNITY): Payer: Self-pay

## 2023-08-26 DIAGNOSIS — J449 Chronic obstructive pulmonary disease, unspecified: Secondary | ICD-10-CM | POA: Diagnosis not present

## 2023-08-26 DIAGNOSIS — L97812 Non-pressure chronic ulcer of other part of right lower leg with fat layer exposed: Secondary | ICD-10-CM | POA: Diagnosis not present

## 2023-08-26 DIAGNOSIS — I5032 Chronic diastolic (congestive) heart failure: Secondary | ICD-10-CM | POA: Diagnosis not present

## 2023-08-26 DIAGNOSIS — I110 Hypertensive heart disease with heart failure: Secondary | ICD-10-CM | POA: Diagnosis not present

## 2023-08-26 DIAGNOSIS — M5136 Other intervertebral disc degeneration, lumbar region with discogenic back pain only: Secondary | ICD-10-CM | POA: Diagnosis not present

## 2023-08-26 DIAGNOSIS — E1151 Type 2 diabetes mellitus with diabetic peripheral angiopathy without gangrene: Secondary | ICD-10-CM | POA: Diagnosis not present

## 2023-08-26 DIAGNOSIS — I872 Venous insufficiency (chronic) (peripheral): Secondary | ICD-10-CM | POA: Diagnosis not present

## 2023-08-26 DIAGNOSIS — I480 Paroxysmal atrial fibrillation: Secondary | ICD-10-CM | POA: Diagnosis not present

## 2023-08-26 DIAGNOSIS — G8929 Other chronic pain: Secondary | ICD-10-CM | POA: Diagnosis not present

## 2023-08-26 NOTE — Telephone Encounter (Signed)
 Charles Gray reached out to me in regard to needing resources for CAP Services with Medicaid. I advised him he would need to reach out to his Medicaid Social Worker and have them see if he qualifies. He understood. I provided him and his aide Charles Gray with that information and phone number for [facility] and they agreed to follow up.   Roberts Ching, EMT-Paramedic [phone] 08/26/2023

## 2023-08-30 DIAGNOSIS — G8929 Other chronic pain: Secondary | ICD-10-CM | POA: Diagnosis not present

## 2023-08-30 DIAGNOSIS — M5136 Other intervertebral disc degeneration, lumbar region with discogenic back pain only: Secondary | ICD-10-CM | POA: Diagnosis not present

## 2023-08-30 DIAGNOSIS — I110 Hypertensive heart disease with heart failure: Secondary | ICD-10-CM | POA: Diagnosis not present

## 2023-08-30 DIAGNOSIS — E1151 Type 2 diabetes mellitus with diabetic peripheral angiopathy without gangrene: Secondary | ICD-10-CM | POA: Diagnosis not present

## 2023-08-30 DIAGNOSIS — J449 Chronic obstructive pulmonary disease, unspecified: Secondary | ICD-10-CM | POA: Diagnosis not present

## 2023-08-30 DIAGNOSIS — L97812 Non-pressure chronic ulcer of other part of right lower leg with fat layer exposed: Secondary | ICD-10-CM | POA: Diagnosis not present

## 2023-08-30 DIAGNOSIS — I480 Paroxysmal atrial fibrillation: Secondary | ICD-10-CM | POA: Diagnosis not present

## 2023-08-30 DIAGNOSIS — I872 Venous insufficiency (chronic) (peripheral): Secondary | ICD-10-CM | POA: Diagnosis not present

## 2023-08-30 DIAGNOSIS — I5032 Chronic diastolic (congestive) heart failure: Secondary | ICD-10-CM | POA: Diagnosis not present

## 2023-08-31 ENCOUNTER — Other Ambulatory Visit (HOSPITAL_COMMUNITY): Payer: Self-pay

## 2023-08-31 NOTE — Progress Notes (Signed)
 Paramedicine Encounter    Patient ID: Charles Gray, male    DOB: 1955/06/26, 68 y.o.   MRN: [id number]   Complaints- stomach cramps, leg pain with weakness.   Assessment- CAOX4, warm and dry seated in his recliner reporting to be feeling tired with leg weakness and pain as well as stomach cramps since taking his metolazone  on Sunday. He normally takes it MWF but he was gaining weight and swelling so he elected to take it on Sunday rather than Monday. He reports taking potassium as prescribed. I obtained vitals as noted. Lower legs swollen and wrapped by [facility] yesterday. Weight is up 6lbs since two weeks ago. Saturday his weight was 293lbs today he is 300lbs. Yesterday 300lbs.   Compliance with meds- allegedly no missed doses per pill box   Pill box filled- two weeks   Refills needed- vraylar, inspra    Meds changes since last visit- none     Social changes- none    VISIT SUMMARY- Arrived for home visit for Charles Gray who reports to be feeling poorly today with stomach cramps, lower leg pain and swelling, fatigue. I obtained vitals as noted. Lungs clear, weight up 6lbs from last visit two weeks ago. Cardio Mems last was 5. I reviewed meds and confirmed same filling pill box for two weeks. Refills as noted. I went to pharmacy to pick up RX's for him. I reviewed HF education with him as he admits to eating salty snacks, drinks, and over eating causing his weight and swelling to increase. I reminded him of the importance of adhering to a low sodium and low fluid diet- he agreed. Home visit complete. I will see him in two weeks.   BP 118/68   Pulse 74   Wt 300 lb (136.1 kg)   SpO2 99%   BMI 31.37 kg/m  Weight yesterday-- 300lbs Last visit weight-- 294lbs      ACTION: Home visit completed     Patient Care Team: Lucius Sabins., MD as PCP - General (Family Medicine) Darlis Eisenmenger, MD as PCP - Cardiology (Cardiology) Denton Flakes, LCSW as Social Worker (Licensed Clinical Social  Worker)  Patient Active Problem List   Diagnosis Date Noted   Acute on chronic heart failure with preserved ejection fraction (HFpEF) (HCC) 03/04/2022   Dyslipidemia 07/17/2021   Lymphedema 07/08/2021   Melena    Gastric polyps    UGIB (upper gastrointestinal bleed) 04/06/2021   Acute encephalopathy 12/21/2020   COPD (chronic obstructive pulmonary disease) (HCC) 12/21/2020   Chronic diastolic (congestive) heart failure (HCC) 11/14/2020   Syncope 10/28/2020   CHF (congestive heart failure) (HCC) 10/28/2020   Chronic venous stasis dermatitis of both lower extremities 08/27/2020   Cellulitis of both lower extremities 08/14/2020   Iron deficiency anemia 08/14/2020   GERD without esophagitis 06/10/2020   Atrial fibrillation with rapid ventricular response (HCC) 03/24/2020   Acute on chronic right heart failure (HCC) 01/27/2020   Acute respiratory failure with hypoxia (HCC)    Fatty liver disease, nonalcoholic    Shock (HCC)    Personal history of DVT (deep vein thrombosis) 12/26/2019   Chronic anticoagulation 12/26/2019   Palpitations 12/26/2019   Paroxysmal atrial fibrillation (HCC) 12/23/2019   Heart failure, systolic, acute (HCC) 08/24/2019   Acute on chronic congestive heart failure (HCC)    Acute on chronic diastolic CHF (congestive heart failure) (HCC) 08/23/2019   Chest pain 08/23/2019   Lactic acidosis 08/23/2019   History of pulmonary embolism 08/12/2019   Atrial tachycardia (HCC)  08/12/2019   Occasional tremors 08/12/2019   OSA (obstructive sleep apnea) 08/12/2019   AKI (acute kidney injury) (HCC) 08/08/2019   Acute on chronic right-sided heart failure (HCC) 08/06/2019   Acute pulmonary embolism (HCC) 06/08/2019   Normocytic anemia 06/08/2019   Pulmonary emboli (HCC) 06/08/2019   Chronic heart failure with preserved ejection fraction (HCC) 05/30/2019   Class 2 severe obesity due to excess calories with serious comorbidity and body mass index (BMI) of 39.0 to 39.9 in  adult [facility]) 05/30/2019   Morbid obesity with BMI of 40.0-44.9, adult (HCC) 05/30/2019   Hyponatremia    Drug induced constipation    Peripheral edema    Hypotension due to drugs    Hypoalbuminemia due to protein-calorie malnutrition (HCC)    Thrombocytopenia (HCC)    Anemia of chronic disease    Cirrhosis of liver without ascites (HCC)    Chronic pain syndrome    Debility 03/13/2019   Portal hypertensive gastropathy (HCC) 03/06/2019   Dyspnea    GIB (gastrointestinal bleeding) 03/04/2019   Mixed hyperlipidemia 01/24/2019   Insomnia 01/24/2019   Hyperlipidemia 01/24/2019   Essential hypertension 01/24/2019   Lumbar disc herniation 01/24/2019    Current Outpatient Medications:    apixaban  (ELIQUIS ) 5 MG TABS tablet, Take 1 tablet (5 mg total) by mouth 2 (two) times daily., Disp: 60 tablet, Rfl: 3   Buprenorphine  HCl-Naloxone  HCl 8-2 MG FILM, Place 1 Film under the tongue in the morning and at bedtime., Disp: , Rfl:    cariprazine (VRAYLAR) 1.5 MG capsule, Take 1.5 mg by mouth daily. Per Dr. Daina Drum, Disp: , Rfl:    eplerenone  (INSPRA ) 50 MG tablet, Take 2 tablets (100 mg total) by mouth daily., Disp: 60 tablet, Rfl: 11   gabapentin  (NEURONTIN ) 800 MG tablet, Take 1 tablet (800 mg total) by mouth 3 (three) times daily., Disp: 90 tablet, Rfl: 1   lactulose , encephalopathy, (CHRONULAC ) 10 GM/15ML SOLN, Take 30 mLs (20 g total) by mouth 2 (two) times daily. (Patient taking differently: Take 15 g by mouth 2 (two) times daily.), Disp: , Rfl:    metolazone  (ZAROXOLYN ) 2.5 MG tablet, Take 1 tablet (2.5 mg total) by mouth 3 (three) times a week. on Mondays Wednesdays and Fridays, Disp: 15 tablet, Rfl: 3   metoprolol  succinate (TOPROL -XL) 25 MG 24 hr tablet, TAKE ONE TABLET BY MOUTH AT BEDTIME, Disp: 30 tablet, Rfl: 11   ondansetron  (ZOFRAN -ODT) 4 MG disintegrating tablet, Take 4 mg by mouth every 8 (eight) hours as needed for nausea or vomiting (dissolve orally)., Disp: , Rfl:    pantoprazole   (PROTONIX ) 40 MG tablet, Take 40 mg by mouth 2 (two) times daily before a meal., Disp: , Rfl:    polyethylene glycol (MIRALAX  / GLYCOLAX ) 17 g packet, Take 17 g by mouth as needed for mild constipation (mix and drink as directed)., Disp: , Rfl:    potassium chloride  (KLOR-CON ) 20 MEQ packet, Take 100 mEq by mouth 3 (three) times daily. Take an extra 40 mEq on days when you take metolazone , Disp: 475 packet, Rfl: 3   pravastatin  (PRAVACHOL ) 20 MG tablet, Take 1 tablet (20 mg total) by mouth daily., Disp: 30 tablet, Rfl: 11   sertraline  (ZOLOFT ) 100 MG tablet, Take 100 mg by mouth daily. At night, Disp: , Rfl:    tamsulosin  (FLOMAX ) 0.4 MG CAPS capsule, Take 1 capsule (0.4 mg total) by mouth daily. Additional refills will need to come from PCP, Disp: 30 capsule, Rfl: 0   torsemide  (DEMADEX ) 100  MG tablet, TAKE ONE TABLET BY MOUTH DAILY IN THE MORNING AND EVENING, Disp: 90 tablet, Rfl: 3   triamcinolone  (KENALOG ) 0.025 % ointment, Apply 1 application. topically 2 (two) times daily., Disp: 30 g, Rfl: 0   VENTOLIN  HFA 108 (90 Base) MCG/ACT inhaler, Inhale 1 puff into the lungs every 4 (four) hours as needed for shortness of breath. (Patient taking differently: Inhale 2 puffs into the lungs every 4 (four) hours as needed for shortness of breath.), Disp: 6.7 g, Rfl: 1   zolpidem  (AMBIEN ) 10 MG tablet, Take 1 tablet (10 mg total) by mouth at bedtime as needed for sleep., Disp: 30 tablet, Rfl: 0 Allergies  Allergen Reactions   Penicillins Other (See Comments)    Caused convulsions as a child  Did it involve swelling of the face/tongue/throat, SOB, or low BP? N/A Did it involve sudden or severe rash/hives, skin peeling, or any reaction on the inside of your mouth or nose? N/A Did you need to seek medical attention at a hospital or doctor's office?N/A When did it last happen? N/A If all above answers are "NO", may proceed with cephalosporin use.     Social History   Socioeconomic History   Marital  status: Single    Spouse name: Not on file   Number of children: Not on file   Years of education: Not on file   Highest education level: Not on file  Occupational History   Not on file  Tobacco Use   Smoking status: Never   Smokeless tobacco: Never  Vaping Use   Vaping status: Never Used  Substance and Sexual Activity   Alcohol use: Not Currently   Drug use: Not Currently    Types: Marijuana    Comment: Smoked for 30 years   Sexual activity: Not on file  Other Topics Concern   Not on file  Social History Narrative   Not on file   Social Drivers of Health   Financial Resource Strain: Medium Risk (11/15/2020)   Overall Financial Resource Strain (CARDIA)    Difficulty of Paying Living Expenses: Somewhat hard  Food Insecurity: No Food Insecurity (03/08/2022)   Hunger Vital Sign    Worried About Running Out of Food in the Last Year: Never true    Ran Out of Food in the Last Year: Never true  Transportation Needs: Unmet Transportation Needs (04/21/2022)   PRAPARE - Administrator, Civil Service (Medical): Yes    Lack of Transportation (Non-Medical): Yes  Physical Activity: Not on file  Stress: Not on file  Social Connections: Not on file  Intimate Partner Violence: Not At Risk (03/08/2022)   Humiliation, Afraid, Rape, and Kick questionnaire    Fear of Current or Ex-Partner: No    Emotionally Abused: No    Physically Abused: No    Sexually Abused: No    Physical Exam      Future Appointments  Date Time Provider Department Center  10/25/2023  1:40 PM Darlis Eisenmenger, MD [facility] None

## 2023-09-02 DIAGNOSIS — L97812 Non-pressure chronic ulcer of other part of right lower leg with fat layer exposed: Secondary | ICD-10-CM | POA: Diagnosis not present

## 2023-09-02 DIAGNOSIS — E1151 Type 2 diabetes mellitus with diabetic peripheral angiopathy without gangrene: Secondary | ICD-10-CM | POA: Diagnosis not present

## 2023-09-02 DIAGNOSIS — J449 Chronic obstructive pulmonary disease, unspecified: Secondary | ICD-10-CM | POA: Diagnosis not present

## 2023-09-02 DIAGNOSIS — I872 Venous insufficiency (chronic) (peripheral): Secondary | ICD-10-CM | POA: Diagnosis not present

## 2023-09-02 DIAGNOSIS — I110 Hypertensive heart disease with heart failure: Secondary | ICD-10-CM | POA: Diagnosis not present

## 2023-09-02 DIAGNOSIS — I480 Paroxysmal atrial fibrillation: Secondary | ICD-10-CM | POA: Diagnosis not present

## 2023-09-02 DIAGNOSIS — G8929 Other chronic pain: Secondary | ICD-10-CM | POA: Diagnosis not present

## 2023-09-02 DIAGNOSIS — I5032 Chronic diastolic (congestive) heart failure: Secondary | ICD-10-CM | POA: Diagnosis not present

## 2023-09-02 DIAGNOSIS — M5136 Other intervertebral disc degeneration, lumbar region with discogenic back pain only: Secondary | ICD-10-CM | POA: Diagnosis not present

## 2023-09-08 DIAGNOSIS — J449 Chronic obstructive pulmonary disease, unspecified: Secondary | ICD-10-CM | POA: Diagnosis not present

## 2023-09-08 DIAGNOSIS — I110 Hypertensive heart disease with heart failure: Secondary | ICD-10-CM | POA: Diagnosis not present

## 2023-09-08 DIAGNOSIS — I5032 Chronic diastolic (congestive) heart failure: Secondary | ICD-10-CM | POA: Diagnosis not present

## 2023-09-08 DIAGNOSIS — I872 Venous insufficiency (chronic) (peripheral): Secondary | ICD-10-CM | POA: Diagnosis not present

## 2023-09-08 DIAGNOSIS — E1151 Type 2 diabetes mellitus with diabetic peripheral angiopathy without gangrene: Secondary | ICD-10-CM | POA: Diagnosis not present

## 2023-09-08 DIAGNOSIS — I480 Paroxysmal atrial fibrillation: Secondary | ICD-10-CM | POA: Diagnosis not present

## 2023-09-08 DIAGNOSIS — M5136 Other intervertebral disc degeneration, lumbar region with discogenic back pain only: Secondary | ICD-10-CM | POA: Diagnosis not present

## 2023-09-08 DIAGNOSIS — G8929 Other chronic pain: Secondary | ICD-10-CM | POA: Diagnosis not present

## 2023-09-08 DIAGNOSIS — L97812 Non-pressure chronic ulcer of other part of right lower leg with fat layer exposed: Secondary | ICD-10-CM | POA: Diagnosis not present

## 2023-09-10 DIAGNOSIS — I480 Paroxysmal atrial fibrillation: Secondary | ICD-10-CM | POA: Diagnosis not present

## 2023-09-10 DIAGNOSIS — I872 Venous insufficiency (chronic) (peripheral): Secondary | ICD-10-CM | POA: Diagnosis not present

## 2023-09-10 DIAGNOSIS — L97812 Non-pressure chronic ulcer of other part of right lower leg with fat layer exposed: Secondary | ICD-10-CM | POA: Diagnosis not present

## 2023-09-10 DIAGNOSIS — M5136 Other intervertebral disc degeneration, lumbar region with discogenic back pain only: Secondary | ICD-10-CM | POA: Diagnosis not present

## 2023-09-10 DIAGNOSIS — E1151 Type 2 diabetes mellitus with diabetic peripheral angiopathy without gangrene: Secondary | ICD-10-CM | POA: Diagnosis not present

## 2023-09-10 DIAGNOSIS — I5032 Chronic diastolic (congestive) heart failure: Secondary | ICD-10-CM | POA: Diagnosis not present

## 2023-09-10 DIAGNOSIS — J449 Chronic obstructive pulmonary disease, unspecified: Secondary | ICD-10-CM | POA: Diagnosis not present

## 2023-09-10 DIAGNOSIS — I110 Hypertensive heart disease with heart failure: Secondary | ICD-10-CM | POA: Diagnosis not present

## 2023-09-10 DIAGNOSIS — G8929 Other chronic pain: Secondary | ICD-10-CM | POA: Diagnosis not present

## 2023-09-12 DIAGNOSIS — R32 Unspecified urinary incontinence: Secondary | ICD-10-CM | POA: Diagnosis not present

## 2023-09-14 ENCOUNTER — Other Ambulatory Visit (HOSPITAL_COMMUNITY): Payer: Self-pay

## 2023-09-14 ENCOUNTER — Telehealth (HOSPITAL_COMMUNITY): Payer: Self-pay

## 2023-09-14 ENCOUNTER — Other Ambulatory Visit (HOSPITAL_COMMUNITY): Payer: Self-pay | Admitting: Internal Medicine

## 2023-09-14 ENCOUNTER — Other Ambulatory Visit (HOSPITAL_COMMUNITY): Payer: Self-pay | Admitting: Cardiology

## 2023-09-14 DIAGNOSIS — I872 Venous insufficiency (chronic) (peripheral): Secondary | ICD-10-CM | POA: Diagnosis not present

## 2023-09-14 DIAGNOSIS — L97812 Non-pressure chronic ulcer of other part of right lower leg with fat layer exposed: Secondary | ICD-10-CM | POA: Diagnosis not present

## 2023-09-14 DIAGNOSIS — R29898 Other symptoms and signs involving the musculoskeletal system: Secondary | ICD-10-CM

## 2023-09-14 DIAGNOSIS — G8929 Other chronic pain: Secondary | ICD-10-CM | POA: Diagnosis not present

## 2023-09-14 DIAGNOSIS — E1151 Type 2 diabetes mellitus with diabetic peripheral angiopathy without gangrene: Secondary | ICD-10-CM | POA: Diagnosis not present

## 2023-09-14 DIAGNOSIS — I480 Paroxysmal atrial fibrillation: Secondary | ICD-10-CM | POA: Diagnosis not present

## 2023-09-14 DIAGNOSIS — I5032 Chronic diastolic (congestive) heart failure: Secondary | ICD-10-CM

## 2023-09-14 DIAGNOSIS — J449 Chronic obstructive pulmonary disease, unspecified: Secondary | ICD-10-CM | POA: Diagnosis not present

## 2023-09-14 DIAGNOSIS — M5136 Other intervertebral disc degeneration, lumbar region with discogenic back pain only: Secondary | ICD-10-CM | POA: Diagnosis not present

## 2023-09-14 DIAGNOSIS — I110 Hypertensive heart disease with heart failure: Secondary | ICD-10-CM | POA: Diagnosis not present

## 2023-09-14 NOTE — Telephone Encounter (Signed)
 Ok to place referral? See below.

## 2023-09-14 NOTE — Progress Notes (Signed)
 Paramedicine Encounter    Patient ID: Charles Gray, male    DOB: 05-22-1955, 68 y.o.   MRN: [id number]   Complaints- abdominal pain, fatigue, generalized pain   Assessment- CAOX4, warm and dry reporting to be feeling fatigued with   Compliance with meds- no missed doses   Pill box filled- for two weeks (nurse aide will be doing week 3)  Refills needed- metoprolol    Meds changes since last visit- none     Social changes- wants referral for [facility]     VISIT SUMMARY- Arrived for home visit for for Charles Gray who reports to be feeling fatigued with some abdominal pain. Abdomen is soft non tender on palpation. GI sounds active.  Lower leg pain with swelling. He is having his legs wrapped tomorrow. I obtained vitals and assessment as noted. Lungs clear. Edema in lower legs at baseline for him. He has been very sedative sitting in his recliner often with minimal exercise. He is is requesting home PT and OT. I will see if HF clinic will place referral. I reviewed meds in detail and filled two weeks of pill boxes. His aide will assist on week 3 as I will be out of office that week. He agreed with plan. I discussed HF education and compliance. He agreed. Home visit complete.   Cardio Mems yesterday- 3 Cardio Mems today- 7   BP 110/60   Pulse 78   Wt 297 lb (134.7 kg)   SpO2 99%   BMI 31.05 kg/m  Weight yesterday-- 299lbs Last visit weight-- 300lbs     ACTION: Home visit completed     Patient Care Team: Lucius Sabins., MD as PCP - General (Family Medicine) Darlis Eisenmenger, MD as PCP - Cardiology (Cardiology) Denton Flakes, LCSW as Social Worker (Licensed Clinical Social Worker)  Patient Active Problem List   Diagnosis Date Noted   Acute on chronic heart failure with preserved ejection fraction (HFpEF) (HCC) 03/04/2022   Dyslipidemia 07/17/2021   Lymphedema 07/08/2021   Melena    Gastric polyps    UGIB (upper gastrointestinal bleed) 04/06/2021   Acute encephalopathy  12/21/2020   COPD (chronic obstructive pulmonary disease) (HCC) 12/21/2020   Chronic diastolic (congestive) heart failure (HCC) 11/14/2020   Syncope 10/28/2020   CHF (congestive heart failure) (HCC) 10/28/2020   Chronic venous stasis dermatitis of both lower extremities 08/27/2020   Cellulitis of both lower extremities 08/14/2020   Iron deficiency anemia 08/14/2020   GERD without esophagitis 06/10/2020   Atrial fibrillation with rapid ventricular response (HCC) 03/24/2020   Acute on chronic right heart failure (HCC) 01/27/2020   Acute respiratory failure with hypoxia (HCC)    Fatty liver disease, nonalcoholic    Shock (HCC)    Personal history of DVT (deep vein thrombosis) 12/26/2019   Chronic anticoagulation 12/26/2019   Palpitations 12/26/2019   Paroxysmal atrial fibrillation (HCC) 12/23/2019   Heart failure, systolic, acute (HCC) 08/24/2019   Acute on chronic congestive heart failure (HCC)    Acute on chronic diastolic CHF (congestive heart failure) (HCC) 08/23/2019   Chest pain 08/23/2019   Lactic acidosis 08/23/2019   History of pulmonary embolism 08/12/2019   Atrial tachycardia (HCC) 08/12/2019   Occasional tremors 08/12/2019   OSA (obstructive sleep apnea) 08/12/2019   AKI (acute kidney injury) (HCC) 08/08/2019   Acute on chronic right-sided heart failure (HCC) 08/06/2019   Acute pulmonary embolism (HCC) 06/08/2019   Normocytic anemia 06/08/2019   Pulmonary emboli (HCC) 06/08/2019   Chronic heart failure with  preserved ejection fraction (HCC) 05/30/2019   Class 2 severe obesity due to excess calories with serious comorbidity and body mass index (BMI) of 39.0 to 39.9 in adult [facility]) 05/30/2019   Morbid obesity with BMI of 40.0-44.9, adult (HCC) 05/30/2019   Hyponatremia    Drug induced constipation    Peripheral edema    Hypotension due to drugs    Hypoalbuminemia due to protein-calorie malnutrition (HCC)    Thrombocytopenia (HCC)    Anemia of chronic disease     Cirrhosis of liver without ascites (HCC)    Chronic pain syndrome    Debility 03/13/2019   Portal hypertensive gastropathy (HCC) 03/06/2019   Dyspnea    GIB (gastrointestinal bleeding) 03/04/2019   Mixed hyperlipidemia 01/24/2019   Insomnia 01/24/2019   Hyperlipidemia 01/24/2019   Essential hypertension 01/24/2019   Lumbar disc herniation 01/24/2019    Current Outpatient Medications:    apixaban  (ELIQUIS ) 5 MG TABS tablet, Take 1 tablet (5 mg total) by mouth 2 (two) times daily., Disp: 60 tablet, Rfl: 3   Buprenorphine  HCl-Naloxone  HCl 8-2 MG FILM, Place 1 Film under the tongue in the morning and at bedtime., Disp: , Rfl:    cariprazine (VRAYLAR) 1.5 MG capsule, Take 1.5 mg by mouth daily. Per Dr. Daina Drum, Disp: , Rfl:    eplerenone  (INSPRA ) 50 MG tablet, Take 2 tablets (100 mg total) by mouth daily., Disp: 60 tablet, Rfl: 11   gabapentin  (NEURONTIN ) 800 MG tablet, Take 1 tablet (800 mg total) by mouth 3 (three) times daily., Disp: 90 tablet, Rfl: 1   lactulose , encephalopathy, (CHRONULAC ) 10 GM/15ML SOLN, Take 30 mLs (20 g total) by mouth 2 (two) times daily. (Patient taking differently: Take 15 g by mouth 2 (two) times daily.), Disp: , Rfl:    metolazone  (ZAROXOLYN ) 2.5 MG tablet, Take 1 tablet (2.5 mg total) by mouth 3 (three) times a week. on Mondays Wednesdays and Fridays, Disp: 15 tablet, Rfl: 3   metoprolol  succinate (TOPROL -XL) 25 MG 24 hr tablet, TAKE ONE TABLET BY MOUTH AT BEDTIME, Disp: 30 tablet, Rfl: 11   ondansetron  (ZOFRAN -ODT) 4 MG disintegrating tablet, Take 4 mg by mouth every 8 (eight) hours as needed for nausea or vomiting (dissolve orally)., Disp: , Rfl:    pantoprazole  (PROTONIX ) 40 MG tablet, Take 40 mg by mouth 2 (two) times daily before a meal., Disp: , Rfl:    polyethylene glycol (MIRALAX  / GLYCOLAX ) 17 g packet, Take 17 g by mouth as needed for mild constipation (mix and drink as directed)., Disp: , Rfl:    potassium chloride  (KLOR-CON ) 20 MEQ packet, Take 100 mEq  by mouth 3 (three) times daily. Take an extra 40 mEq on days when you take metolazone , Disp: 475 packet, Rfl: 3   pravastatin  (PRAVACHOL ) 20 MG tablet, Take 1 tablet (20 mg total) by mouth daily., Disp: 30 tablet, Rfl: 11   sertraline  (ZOLOFT ) 100 MG tablet, Take 100 mg by mouth daily. At night, Disp: , Rfl:    tamsulosin  (FLOMAX ) 0.4 MG CAPS capsule, Take 1 capsule (0.4 mg total) by mouth daily. Additional refills will need to come from PCP, Disp: 30 capsule, Rfl: 0   torsemide  (DEMADEX ) 100 MG tablet, TAKE ONE TABLET BY MOUTH DAILY IN THE MORNING AND EVENING, Disp: 90 tablet, Rfl: 3   triamcinolone  (KENALOG ) 0.025 % ointment, Apply 1 application. topically 2 (two) times daily., Disp: 30 g, Rfl: 0   VENTOLIN  HFA 108 (90 Base) MCG/ACT inhaler, Inhale 1 puff into the lungs every 4 (  four) hours as needed for shortness of breath. (Patient taking differently: Inhale 2 puffs into the lungs every 4 (four) hours as needed for shortness of breath.), Disp: 6.7 g, Rfl: 1   zolpidem  (AMBIEN ) 10 MG tablet, Take 1 tablet (10 mg total) by mouth at bedtime as needed for sleep., Disp: 30 tablet, Rfl: 0 Allergies  Allergen Reactions   Penicillins Other (See Comments)    Caused convulsions as a child  Did it involve swelling of the face/tongue/throat, SOB, or low BP? N/A Did it involve sudden or severe rash/hives, skin peeling, or any reaction on the inside of your mouth or nose? N/A Did you need to seek medical attention at a hospital or doctor's office?N/A When did it last happen? N/A If all above answers are "NO", may proceed with cephalosporin use.     Social History   Socioeconomic History   Marital status: Single    Spouse name: Not on file   Number of children: Not on file   Years of education: Not on file   Highest education level: Not on file  Occupational History   Not on file  Tobacco Use   Smoking status: Never   Smokeless tobacco: Never  Vaping Use   Vaping status: Never Used  Substance  and Sexual Activity   Alcohol use: Not Currently   Drug use: Not Currently    Types: Marijuana    Comment: Smoked for 30 years   Sexual activity: Not on file  Other Topics Concern   Not on file  Social History Narrative   Not on file   Social Drivers of Health   Financial Resource Strain: Medium Risk (11/15/2020)   Overall Financial Resource Strain (CARDIA)    Difficulty of Paying Living Expenses: Somewhat hard  Food Insecurity: No Food Insecurity (03/08/2022)   Hunger Vital Sign    Worried About Running Out of Food in the Last Year: Never true    Ran Out of Food in the Last Year: Never true  Transportation Needs: Unmet Transportation Needs (04/21/2022)   PRAPARE - Administrator, Civil Service (Medical): Yes    Lack of Transportation (Non-Medical): Yes  Physical Activity: Not on file  Stress: Not on file  Social Connections: Not on file  Intimate Partner Violence: Not At Risk (03/08/2022)   Humiliation, Afraid, Rape, and Kick questionnaire    Fear of Current or Ex-Partner: No    Emotionally Abused: No    Physically Abused: No    Sexually Abused: No    Physical Exam      Future Appointments  Date Time Provider Department Center  10/25/2023  1:40 PM Darlis Eisenmenger, MD [facility] None

## 2023-09-14 NOTE — Telephone Encounter (Signed)
  HF Paramedicine Message to [facility]     Issue/reason for call:    Charles Gray is requesting a referral for home PT and OT as he has severe deconditioning. Request sent to HF triage for Dr. Charline Contras team to advise.   Roberts Ching, EMT-Paramedic [phone] 09/14/2023

## 2023-09-15 NOTE — Telephone Encounter (Signed)
 Ok to place referral.

## 2023-09-16 DIAGNOSIS — F1120 Opioid dependence, uncomplicated: Secondary | ICD-10-CM | POA: Diagnosis not present

## 2023-09-16 DIAGNOSIS — F332 Major depressive disorder, recurrent severe without psychotic features: Secondary | ICD-10-CM | POA: Diagnosis not present

## 2023-09-16 DIAGNOSIS — F411 Generalized anxiety disorder: Secondary | ICD-10-CM | POA: Diagnosis not present

## 2023-09-17 DIAGNOSIS — E1151 Type 2 diabetes mellitus with diabetic peripheral angiopathy without gangrene: Secondary | ICD-10-CM | POA: Diagnosis not present

## 2023-09-17 DIAGNOSIS — I480 Paroxysmal atrial fibrillation: Secondary | ICD-10-CM | POA: Diagnosis not present

## 2023-09-17 DIAGNOSIS — J449 Chronic obstructive pulmonary disease, unspecified: Secondary | ICD-10-CM | POA: Diagnosis not present

## 2023-09-17 DIAGNOSIS — M5136 Other intervertebral disc degeneration, lumbar region with discogenic back pain only: Secondary | ICD-10-CM | POA: Diagnosis not present

## 2023-09-17 DIAGNOSIS — G8929 Other chronic pain: Secondary | ICD-10-CM | POA: Diagnosis not present

## 2023-09-17 DIAGNOSIS — L97812 Non-pressure chronic ulcer of other part of right lower leg with fat layer exposed: Secondary | ICD-10-CM | POA: Diagnosis not present

## 2023-09-17 DIAGNOSIS — I5032 Chronic diastolic (congestive) heart failure: Secondary | ICD-10-CM | POA: Diagnosis not present

## 2023-09-17 DIAGNOSIS — I110 Hypertensive heart disease with heart failure: Secondary | ICD-10-CM | POA: Diagnosis not present

## 2023-09-17 DIAGNOSIS — I872 Venous insufficiency (chronic) (peripheral): Secondary | ICD-10-CM | POA: Diagnosis not present

## 2023-09-20 DIAGNOSIS — I110 Hypertensive heart disease with heart failure: Secondary | ICD-10-CM | POA: Diagnosis not present

## 2023-09-20 DIAGNOSIS — G8929 Other chronic pain: Secondary | ICD-10-CM | POA: Diagnosis not present

## 2023-09-20 DIAGNOSIS — L97812 Non-pressure chronic ulcer of other part of right lower leg with fat layer exposed: Secondary | ICD-10-CM | POA: Diagnosis not present

## 2023-09-20 DIAGNOSIS — E1151 Type 2 diabetes mellitus with diabetic peripheral angiopathy without gangrene: Secondary | ICD-10-CM | POA: Diagnosis not present

## 2023-09-20 DIAGNOSIS — I5032 Chronic diastolic (congestive) heart failure: Secondary | ICD-10-CM | POA: Diagnosis not present

## 2023-09-20 DIAGNOSIS — I872 Venous insufficiency (chronic) (peripheral): Secondary | ICD-10-CM | POA: Diagnosis not present

## 2023-09-20 DIAGNOSIS — M5136 Other intervertebral disc degeneration, lumbar region with discogenic back pain only: Secondary | ICD-10-CM | POA: Diagnosis not present

## 2023-09-20 DIAGNOSIS — J449 Chronic obstructive pulmonary disease, unspecified: Secondary | ICD-10-CM | POA: Diagnosis not present

## 2023-09-20 DIAGNOSIS — I480 Paroxysmal atrial fibrillation: Secondary | ICD-10-CM | POA: Diagnosis not present

## 2023-09-23 DIAGNOSIS — L97812 Non-pressure chronic ulcer of other part of right lower leg with fat layer exposed: Secondary | ICD-10-CM | POA: Diagnosis not present

## 2023-09-23 DIAGNOSIS — G8929 Other chronic pain: Secondary | ICD-10-CM | POA: Diagnosis not present

## 2023-09-23 DIAGNOSIS — I480 Paroxysmal atrial fibrillation: Secondary | ICD-10-CM | POA: Diagnosis not present

## 2023-09-23 DIAGNOSIS — I5032 Chronic diastolic (congestive) heart failure: Secondary | ICD-10-CM | POA: Diagnosis not present

## 2023-09-23 DIAGNOSIS — E1151 Type 2 diabetes mellitus with diabetic peripheral angiopathy without gangrene: Secondary | ICD-10-CM | POA: Diagnosis not present

## 2023-09-23 DIAGNOSIS — J449 Chronic obstructive pulmonary disease, unspecified: Secondary | ICD-10-CM | POA: Diagnosis not present

## 2023-09-23 DIAGNOSIS — M5136 Other intervertebral disc degeneration, lumbar region with discogenic back pain only: Secondary | ICD-10-CM | POA: Diagnosis not present

## 2023-09-23 DIAGNOSIS — I872 Venous insufficiency (chronic) (peripheral): Secondary | ICD-10-CM | POA: Diagnosis not present

## 2023-09-23 DIAGNOSIS — I110 Hypertensive heart disease with heart failure: Secondary | ICD-10-CM | POA: Diagnosis not present

## 2023-09-27 NOTE — Addendum Note (Signed)
 Addended by: Douglass Gelineau R on: 09/27/2023 09:45 AM   Modules accepted: Orders

## 2023-09-28 DIAGNOSIS — S91001A Unspecified open wound, right ankle, initial encounter: Secondary | ICD-10-CM | POA: Diagnosis not present

## 2023-09-29 DIAGNOSIS — J449 Chronic obstructive pulmonary disease, unspecified: Secondary | ICD-10-CM | POA: Diagnosis not present

## 2023-09-29 DIAGNOSIS — M5136 Other intervertebral disc degeneration, lumbar region with discogenic back pain only: Secondary | ICD-10-CM | POA: Diagnosis not present

## 2023-09-29 DIAGNOSIS — G8929 Other chronic pain: Secondary | ICD-10-CM | POA: Diagnosis not present

## 2023-09-29 DIAGNOSIS — I110 Hypertensive heart disease with heart failure: Secondary | ICD-10-CM | POA: Diagnosis not present

## 2023-09-29 DIAGNOSIS — E1151 Type 2 diabetes mellitus with diabetic peripheral angiopathy without gangrene: Secondary | ICD-10-CM | POA: Diagnosis not present

## 2023-09-29 DIAGNOSIS — I5032 Chronic diastolic (congestive) heart failure: Secondary | ICD-10-CM | POA: Diagnosis not present

## 2023-09-29 DIAGNOSIS — I480 Paroxysmal atrial fibrillation: Secondary | ICD-10-CM | POA: Diagnosis not present

## 2023-09-29 DIAGNOSIS — I872 Venous insufficiency (chronic) (peripheral): Secondary | ICD-10-CM | POA: Diagnosis not present

## 2023-09-29 DIAGNOSIS — L97812 Non-pressure chronic ulcer of other part of right lower leg with fat layer exposed: Secondary | ICD-10-CM | POA: Diagnosis not present

## 2023-10-01 DIAGNOSIS — J449 Chronic obstructive pulmonary disease, unspecified: Secondary | ICD-10-CM | POA: Diagnosis not present

## 2023-10-01 DIAGNOSIS — I110 Hypertensive heart disease with heart failure: Secondary | ICD-10-CM | POA: Diagnosis not present

## 2023-10-01 DIAGNOSIS — G8929 Other chronic pain: Secondary | ICD-10-CM | POA: Diagnosis not present

## 2023-10-01 DIAGNOSIS — I5032 Chronic diastolic (congestive) heart failure: Secondary | ICD-10-CM | POA: Diagnosis not present

## 2023-10-01 DIAGNOSIS — I872 Venous insufficiency (chronic) (peripheral): Secondary | ICD-10-CM | POA: Diagnosis not present

## 2023-10-01 DIAGNOSIS — M5136 Other intervertebral disc degeneration, lumbar region with discogenic back pain only: Secondary | ICD-10-CM | POA: Diagnosis not present

## 2023-10-01 DIAGNOSIS — Z0000 Encounter for general adult medical examination without abnormal findings: Secondary | ICD-10-CM | POA: Diagnosis not present

## 2023-10-01 DIAGNOSIS — I480 Paroxysmal atrial fibrillation: Secondary | ICD-10-CM | POA: Diagnosis not present

## 2023-10-01 DIAGNOSIS — E1151 Type 2 diabetes mellitus with diabetic peripheral angiopathy without gangrene: Secondary | ICD-10-CM | POA: Diagnosis not present

## 2023-10-01 DIAGNOSIS — L97812 Non-pressure chronic ulcer of other part of right lower leg with fat layer exposed: Secondary | ICD-10-CM | POA: Diagnosis not present

## 2023-10-04 DIAGNOSIS — G8929 Other chronic pain: Secondary | ICD-10-CM | POA: Diagnosis not present

## 2023-10-04 DIAGNOSIS — I480 Paroxysmal atrial fibrillation: Secondary | ICD-10-CM | POA: Diagnosis not present

## 2023-10-04 DIAGNOSIS — J449 Chronic obstructive pulmonary disease, unspecified: Secondary | ICD-10-CM | POA: Diagnosis not present

## 2023-10-04 DIAGNOSIS — L97812 Non-pressure chronic ulcer of other part of right lower leg with fat layer exposed: Secondary | ICD-10-CM | POA: Diagnosis not present

## 2023-10-04 DIAGNOSIS — I872 Venous insufficiency (chronic) (peripheral): Secondary | ICD-10-CM | POA: Diagnosis not present

## 2023-10-04 DIAGNOSIS — M5136 Other intervertebral disc degeneration, lumbar region with discogenic back pain only: Secondary | ICD-10-CM | POA: Diagnosis not present

## 2023-10-04 DIAGNOSIS — I110 Hypertensive heart disease with heart failure: Secondary | ICD-10-CM | POA: Diagnosis not present

## 2023-10-04 DIAGNOSIS — E1151 Type 2 diabetes mellitus with diabetic peripheral angiopathy without gangrene: Secondary | ICD-10-CM | POA: Diagnosis not present

## 2023-10-04 DIAGNOSIS — I5032 Chronic diastolic (congestive) heart failure: Secondary | ICD-10-CM | POA: Diagnosis not present

## 2023-10-05 ENCOUNTER — Other Ambulatory Visit (HOSPITAL_COMMUNITY): Payer: Self-pay

## 2023-10-05 NOTE — Progress Notes (Signed)
 Paramedicine Encounter    Patient ID: Charles Gray, male    DOB: [birth year], 68 y.o.   MRN: [id number]   Complaints- general fatigue and pain   Assessment- CAOx4 warm and dry reporting to be feeling not good. He says he has general aches and pains, leg swelling and pain, difficulty ambulating and excessive unrination- these have been chronic issues for him. Vitals obtained. Legs wrapped by [facility] yesteday. Lungs clear. Weight is up from last visit.   Compliance with meds- no missed doses of medications   Pill box filled- for two weeks  Refills needed-  Flomax , metoprolol , vraylar, inspra    Meds changes since last visit- none     Social changes- wanting CAP services- provided him info on how to apply.     VISIT SUMMARY- Arrived for home visit for Charles Gray who reports to be feeling poorly with general aches and pains, difficulty getting around due to deconditioning and excessive urination. No changes from last visit. He has been compliant with meds. Vitals and assessment obtained. Meds reviewed and confirmed. Pill box filled for two weeks. We discussed HF education and importance of getting transportation for upcoming appointment at HF clinic. He agreed with plan. Home visit complete. I will see Charles Gray in two weeks.   BP 110/60   Pulse 80   Resp 18   Wt (!) 303 lb (137.4 kg)   BMI 31.68 kg/m  Weight yesterday-- didn't weigh  Last visit weight-- 297lbs      ACTION: Home visit completed     Patient Care Team: Sabas Norleen PARAS., MD as PCP - General (Family Medicine) Rolan Ezra RAMAN, MD as PCP - Cardiology (Cardiology) Cathern Andriette DEL, LCSW as Social Worker (Licensed Clinical Social Worker)  Patient Active Problem List   Diagnosis Date Noted   Acute on chronic heart failure with preserved ejection fraction (HFpEF) (HCC) 03/04/2022   Dyslipidemia 07/17/2021   Lymphedema 07/08/2021   Melena    Gastric polyps    UGIB (upper gastrointestinal bleed) 04/06/2021   Acute  encephalopathy 12/21/2020   COPD (chronic obstructive pulmonary disease) (HCC) 12/21/2020   Chronic diastolic (congestive) heart failure (HCC) 11/14/2020   Syncope 10/28/2020   CHF (congestive heart failure) (HCC) 10/28/2020   Chronic venous stasis dermatitis of both lower extremities 08/27/2020   Cellulitis of both lower extremities 08/14/2020   Iron deficiency anemia 08/14/2020   GERD without esophagitis 06/10/2020   Atrial fibrillation with rapid ventricular response (HCC) 03/24/2020   Acute on chronic right heart failure (HCC) 01/27/2020   Acute respiratory failure with hypoxia (HCC)    Fatty liver disease, nonalcoholic    Shock (HCC)    Personal history of DVT (deep vein thrombosis) 12/26/2019   Chronic anticoagulation 12/26/2019   Palpitations 12/26/2019   Paroxysmal atrial fibrillation (HCC) 12/23/2019   Heart failure, systolic, acute (HCC) 08/24/2019   Acute on chronic congestive heart failure (HCC)    Acute on chronic diastolic CHF (congestive heart failure) (HCC) 08/23/2019   Chest pain 08/23/2019   Lactic acidosis 08/23/2019   History of pulmonary embolism 08/12/2019   Atrial tachycardia (HCC) 08/12/2019   Occasional tremors 08/12/2019   OSA (obstructive sleep apnea) 08/12/2019   AKI (acute kidney injury) (HCC) 08/08/2019   Acute on chronic right-sided heart failure (HCC) 08/06/2019   Acute pulmonary embolism (HCC) 06/08/2019   Normocytic anemia 06/08/2019   Pulmonary emboli (HCC) 06/08/2019   Chronic heart failure with preserved ejection fraction (HCC) 05/30/2019   Class 2 severe obesity due to excess  calories with serious comorbidity and body mass index (BMI) of 39.0 to 39.9 in adult [facility]) 05/30/2019   Morbid obesity with BMI of 40.0-44.9, adult (HCC) 05/30/2019   Hyponatremia    Drug induced constipation    Peripheral edema    Hypotension due to drugs    Hypoalbuminemia due to protein-calorie malnutrition (HCC)    Thrombocytopenia (HCC)    Anemia of chronic  disease    Cirrhosis of liver without ascites (HCC)    Chronic pain syndrome    Debility 03/13/2019   Portal hypertensive gastropathy (HCC) 03/06/2019   Dyspnea    GIB (gastrointestinal bleeding) 03/04/2019   Mixed hyperlipidemia 01/24/2019   Insomnia 01/24/2019   Hyperlipidemia 01/24/2019   Essential hypertension 01/24/2019   Lumbar disc herniation 01/24/2019    Current Outpatient Medications:    apixaban  (ELIQUIS ) 5 MG TABS tablet, Take 1 tablet (5 mg total) by mouth 2 (two) times daily., Disp: 60 tablet, Rfl: 3   Buprenorphine  HCl-Naloxone  HCl 8-2 MG FILM, Place 1 Film under the tongue in the morning and at bedtime., Disp: , Rfl:    cariprazine (VRAYLAR) 1.5 MG capsule, Take 1.5 mg by mouth daily. Per Dr. Elaine, Disp: , Rfl:    eplerenone  (INSPRA ) 50 MG tablet, Take 2 tablets (100 mg total) by mouth daily., Disp: 60 tablet, Rfl: 11   gabapentin  (NEURONTIN ) 800 MG tablet, Take 1 tablet (800 mg total) by mouth 3 (three) times daily., Disp: 90 tablet, Rfl: 1   lactulose , encephalopathy, (CHRONULAC ) 10 GM/15ML SOLN, Take 30 mLs (20 g total) by mouth 2 (two) times daily., Disp: , Rfl:    metolazone  (ZAROXOLYN ) 2.5 MG tablet, Take 1 tablet (2.5 mg total) by mouth 3 (three) times a week. on Mondays Wednesdays and Fridays, Disp: 15 tablet, Rfl: 3   metoprolol  succinate (TOPROL -XL) 25 MG 24 hr tablet, TAKE ONE TABLET BY MOUTH AT BEDTIME, Disp: 30 tablet, Rfl: 11   ondansetron  (ZOFRAN -ODT) 4 MG disintegrating tablet, Take 4 mg by mouth every 8 (eight) hours as needed for nausea or vomiting (dissolve orally)., Disp: , Rfl:    pantoprazole  (PROTONIX ) 40 MG tablet, Take 40 mg by mouth 2 (two) times daily before a meal., Disp: , Rfl:    polyethylene glycol (MIRALAX  / GLYCOLAX ) 17 g packet, Take 17 g by mouth as needed for mild constipation (mix and drink as directed)., Disp: , Rfl:    potassium chloride  (KLOR-CON ) 20 MEQ packet, Take 100 mEq by mouth 3 (three) times daily. Take an extra 40 mEq on  days when you take metolazone , Disp: 475 packet, Rfl: 3   pravastatin  (PRAVACHOL ) 20 MG tablet, Take 1 tablet by mouth daily., Disp: 30 tablet, Rfl: 11   sertraline  (ZOLOFT ) 100 MG tablet, Take 100 mg by mouth daily. At night, Disp: , Rfl:    tamsulosin  (FLOMAX ) 0.4 MG CAPS capsule, Take 1 capsule (0.4 mg total) by mouth daily. Additional refills will need to come from PCP, Disp: 30 capsule, Rfl: 0   torsemide  (DEMADEX ) 100 MG tablet, TAKE ONE TABLET BY MOUTH DAILY IN THE MORNING AND EVENING, Disp: 90 tablet, Rfl: 3   triamcinolone  (KENALOG ) 0.025 % ointment, Apply 1 application. topically 2 (two) times daily., Disp: 30 g, Rfl: 0   VENTOLIN  HFA 108 (90 Base) MCG/ACT inhaler, Inhale 1 puff into the lungs every 4 (four) hours as needed for shortness of breath., Disp: 6.7 g, Rfl: 1   zolpidem  (AMBIEN ) 10 MG tablet, Take 1 tablet (10 mg total) by mouth at  bedtime as needed for sleep., Disp: 30 tablet, Rfl: 0 Allergies  Allergen Reactions   Penicillins Other (See Comments)    Caused convulsions as a child  Did it involve swelling of the face/tongue/throat, SOB, or low BP? N/A Did it involve sudden or severe rash/hives, skin peeling, or any reaction on the inside of your mouth or nose? N/A Did you need to seek medical attention at a hospital or doctor's office?N/A When did it last happen? N/A If all above answers are NO, may proceed with cephalosporin use.     Social History   Socioeconomic History   Marital status: Single    Spouse name: Not on file   Number of children: Not on file   Years of education: Not on file   Highest education level: Not on file  Occupational History   Not on file  Tobacco Use   Smoking status: Never   Smokeless tobacco: Never  Vaping Use   Vaping status: Never Used  Substance and Sexual Activity   Alcohol use: Not Currently   Drug use: Not Currently    Types: Marijuana    Comment: Smoked for 30 years   Sexual activity: Not on file  Other Topics Concern    Not on file  Social History Narrative   Not on file   Social Drivers of Health   Financial Resource Strain: Medium Risk (11/15/2020)   Overall Financial Resource Strain (CARDIA)    Difficulty of Paying Living Expenses: Somewhat hard  Food Insecurity: No Food Insecurity (03/08/2022)   Hunger Vital Sign    Worried About Running Out of Food in the Last Year: Never true    Ran Out of Food in the Last Year: Never true  Transportation Needs: Unmet Transportation Needs (04/21/2022)   PRAPARE - Administrator, Civil Service (Medical): Yes    Lack of Transportation (Non-Medical): Yes  Physical Activity: Not on file  Stress: Not on file  Social Connections: Not on file  Intimate Partner Violence: Not At Risk (03/08/2022)   Humiliation, Afraid, Rape, and Kick questionnaire    Fear of Current or Ex-Partner: No    Emotionally Abused: No    Physically Abused: No    Sexually Abused: No    Physical Exam      Future Appointments  Date Time Provider Department Center  10/25/2023  1:40 PM Rolan Ezra RAMAN, MD [facility] None

## 2023-10-07 DIAGNOSIS — E1151 Type 2 diabetes mellitus with diabetic peripheral angiopathy without gangrene: Secondary | ICD-10-CM | POA: Diagnosis not present

## 2023-10-07 DIAGNOSIS — G8929 Other chronic pain: Secondary | ICD-10-CM | POA: Diagnosis not present

## 2023-10-07 DIAGNOSIS — J449 Chronic obstructive pulmonary disease, unspecified: Secondary | ICD-10-CM | POA: Diagnosis not present

## 2023-10-07 DIAGNOSIS — L97812 Non-pressure chronic ulcer of other part of right lower leg with fat layer exposed: Secondary | ICD-10-CM | POA: Diagnosis not present

## 2023-10-07 DIAGNOSIS — I110 Hypertensive heart disease with heart failure: Secondary | ICD-10-CM | POA: Diagnosis not present

## 2023-10-07 DIAGNOSIS — I5032 Chronic diastolic (congestive) heart failure: Secondary | ICD-10-CM | POA: Diagnosis not present

## 2023-10-07 DIAGNOSIS — I872 Venous insufficiency (chronic) (peripheral): Secondary | ICD-10-CM | POA: Diagnosis not present

## 2023-10-07 DIAGNOSIS — I480 Paroxysmal atrial fibrillation: Secondary | ICD-10-CM | POA: Diagnosis not present

## 2023-10-07 DIAGNOSIS — M5136 Other intervertebral disc degeneration, lumbar region with discogenic back pain only: Secondary | ICD-10-CM | POA: Diagnosis not present

## 2023-10-12 ENCOUNTER — Other Ambulatory Visit (HOSPITAL_COMMUNITY): Payer: Self-pay | Admitting: Family Medicine

## 2023-10-14 DIAGNOSIS — F1120 Opioid dependence, uncomplicated: Secondary | ICD-10-CM | POA: Diagnosis not present

## 2023-10-14 DIAGNOSIS — Z6833 Body mass index (BMI) 33.0-33.9, adult: Secondary | ICD-10-CM | POA: Diagnosis not present

## 2023-10-14 DIAGNOSIS — F411 Generalized anxiety disorder: Secondary | ICD-10-CM | POA: Diagnosis not present

## 2023-10-14 DIAGNOSIS — F332 Major depressive disorder, recurrent severe without psychotic features: Secondary | ICD-10-CM | POA: Diagnosis not present

## 2023-10-19 ENCOUNTER — Other Ambulatory Visit (HOSPITAL_COMMUNITY): Payer: Self-pay

## 2023-10-19 NOTE — Progress Notes (Signed)
 Paramedicine Encounter    Patient ID: Charles Gray, male    DOB: [birth year], 68 y.o.   MRN: [id number]   Complaints-  weight gain- lower leg swelling with redness needing UNNA boots.   Assessment- CAOX4, warm and dry seated in his recliner in his living room, lower legs swollen, red and tender. He reports his nurse has not come back to wrap his legs recently. Lungs clear. Vitals within normal limits. Weight is up 4 lbs from last visit.   Compliance with meds- missed one metolazone  dose and two torsemide  doses   Pill box filled- for two weeks   Refills needed- none   Meds changes since last visit- none     Social changes- Spoke to PCP office and they confirmed they had sent a referral for leg wraps but they have not contacted the patient or come out for a visit- RN at PCP office plans to reach out to wound care and set up visit. I will continue to follow up.    VISIT SUMMARY- Arrived for home visit for Charles Gray who reports to be feeling okay but having some weight gain along with increased leg swelling-however he has missed two doses of torsemide  and one dose of metolazone . I advised him to ensure he is staying med compliant he verbalized understanding. I also called PCP office about unna boot referral will continue to follow up. I obtained vitals and assessment as noted. We reviewed HF education. I reviewed upcoming appointments. I reviewed meds and confirmed same filling pill box for two weeks. He is unable to recall medications on his own- he has no family support to fill his pill boxes and his CNA in the home is not able to do it. His pharmacy does not offer bubble packs. Will seek options in upcoming meeting for med goals. Home visit complete.   BP 122/60   Pulse 79   Resp 16   Wt (!) 307 lb (139.3 kg)   SpO2 99%   BMI 32.10 kg/m  Weight yesterday-- didn't weigh  Last visit weight-- 303lbs Weight on Sunday- 305lbs      ACTION: Home visit completed     Patient Care  Team: Sabas Norleen PARAS., MD as PCP - General (Family Medicine) Rolan Ezra RAMAN, MD as PCP - Cardiology (Cardiology) Cathern Andriette DEL, LCSW as Social Worker (Licensed Clinical Social Worker)  Patient Active Problem List   Diagnosis Date Noted   Acute on chronic heart failure with preserved ejection fraction (HFpEF) (HCC) 03/04/2022   Dyslipidemia 07/17/2021   Lymphedema 07/08/2021   Melena    Gastric polyps    UGIB (upper gastrointestinal bleed) 04/06/2021   Acute encephalopathy 12/21/2020   COPD (chronic obstructive pulmonary disease) (HCC) 12/21/2020   Chronic diastolic (congestive) heart failure (HCC) 11/14/2020   Syncope 10/28/2020   CHF (congestive heart failure) (HCC) 10/28/2020   Chronic venous stasis dermatitis of both lower extremities 08/27/2020   Cellulitis of both lower extremities 08/14/2020   Iron deficiency anemia 08/14/2020   GERD without esophagitis 06/10/2020   Atrial fibrillation with rapid ventricular response (HCC) 03/24/2020   Acute on chronic right heart failure (HCC) 01/27/2020   Acute respiratory failure with hypoxia (HCC)    Fatty liver disease, nonalcoholic    Shock (HCC)    Personal history of DVT (deep vein thrombosis) 12/26/2019   Chronic anticoagulation 12/26/2019   Palpitations 12/26/2019   Paroxysmal atrial fibrillation (HCC) 12/23/2019   Heart failure, systolic, acute (HCC) 08/24/2019   Acute on chronic congestive  heart failure (HCC)    Acute on chronic diastolic CHF (congestive heart failure) (HCC) 08/23/2019   Chest pain 08/23/2019   Lactic acidosis 08/23/2019   History of pulmonary embolism 08/12/2019   Atrial tachycardia (HCC) 08/12/2019   Occasional tremors 08/12/2019   OSA (obstructive sleep apnea) 08/12/2019   AKI (acute kidney injury) (HCC) 08/08/2019   Acute on chronic right-sided heart failure (HCC) 08/06/2019   Acute pulmonary embolism (HCC) 06/08/2019   Normocytic anemia 06/08/2019   Pulmonary emboli (HCC) 06/08/2019   Chronic  heart failure with preserved ejection fraction (HCC) 05/30/2019   Class 2 severe obesity due to excess calories with serious comorbidity and body mass index (BMI) of 39.0 to 39.9 in adult (HCC) 05/30/2019   Morbid obesity with BMI of 40.0-44.9, adult (HCC) 05/30/2019   Hyponatremia    Drug induced constipation    Peripheral edema    Hypotension due to drugs    Hypoalbuminemia due to protein-calorie malnutrition (HCC)    Thrombocytopenia (HCC)    Anemia of chronic disease    Cirrhosis of liver without ascites (HCC)    Chronic pain syndrome    Debility 03/13/2019   Portal hypertensive gastropathy (HCC) 03/06/2019   Dyspnea    GIB (gastrointestinal bleeding) 03/04/2019   Mixed hyperlipidemia 01/24/2019   Insomnia 01/24/2019   Hyperlipidemia 01/24/2019   Essential hypertension 01/24/2019   Lumbar disc herniation 01/24/2019    Current Outpatient Medications:    apixaban  (ELIQUIS ) 5 MG TABS tablet, Take 1 tablet (5 mg total) by mouth 2 (two) times daily., Disp: 60 tablet, Rfl: 3   Buprenorphine  HCl-Naloxone  HCl 8-2 MG FILM, Place 1 Film under the tongue in the morning and at bedtime., Disp: , Rfl:    cariprazine (VRAYLAR) 1.5 MG capsule, Take 1.5 mg by mouth daily. Per Dr. Elaine, Disp: , Rfl:    eplerenone  (INSPRA ) 50 MG tablet, TAKE TWO TABLETS BY MOUTH DAILY, Disp: 60 tablet, Rfl: 5   gabapentin  (NEURONTIN ) 800 MG tablet, Take 1 tablet (800 mg total) by mouth 3 (three) times daily., Disp: 90 tablet, Rfl: 1   metolazone  (ZAROXOLYN ) 2.5 MG tablet, Take 1 tablet (2.5 mg total) by mouth 3 (three) times a week. on Mondays Wednesdays and Fridays, Disp: 15 tablet, Rfl: 3   metoprolol  succinate (TOPROL -XL) 25 MG 24 hr tablet, TAKE ONE TABLET BY MOUTH AT BEDTIME, Disp: 30 tablet, Rfl: 11   ondansetron  (ZOFRAN -ODT) 4 MG disintegrating tablet, Take 4 mg by mouth every 8 (eight) hours as needed for nausea or vomiting (dissolve orally)., Disp: , Rfl:    pantoprazole  (PROTONIX ) 40 MG tablet, Take 40  mg by mouth 2 (two) times daily before a meal., Disp: , Rfl:    polyethylene glycol (MIRALAX  / GLYCOLAX ) 17 g packet, Take 17 g by mouth as needed for mild constipation (mix and drink as directed)., Disp: , Rfl:    potassium chloride  (KLOR-CON ) 20 MEQ packet, Take 100 mEq by mouth 3 (three) times daily. Take an extra 40 mEq on days when you take metolazone , Disp: 475 packet, Rfl: 3   pravastatin  (PRAVACHOL ) 20 MG tablet, Take 1 tablet by mouth daily., Disp: 30 tablet, Rfl: 11   sertraline  (ZOLOFT ) 100 MG tablet, Take 100 mg by mouth daily. At night, Disp: , Rfl:    tamsulosin  (FLOMAX ) 0.4 MG CAPS capsule, Take 1 capsule (0.4 mg total) by mouth daily. Additional refills will need to come from PCP, Disp: 30 capsule, Rfl: 0   torsemide  (DEMADEX ) 100 MG tablet, TAKE ONE  TABLET BY MOUTH DAILY IN THE MORNING AND EVENING, Disp: 90 tablet, Rfl: 3   triamcinolone  (KENALOG ) 0.025 % ointment, Apply 1 application. topically 2 (two) times daily., Disp: 30 g, Rfl: 0   VENTOLIN  HFA 108 (90 Base) MCG/ACT inhaler, Inhale 1 puff into the lungs every 4 (four) hours as needed for shortness of breath., Disp: 6.7 g, Rfl: 1   zolpidem  (AMBIEN ) 10 MG tablet, Take 1 tablet (10 mg total) by mouth at bedtime as needed for sleep., Disp: 30 tablet, Rfl: 0   lactulose , encephalopathy, (CHRONULAC ) 10 GM/15ML SOLN, Take 30 mLs (20 g total) by mouth 2 (two) times daily., Disp: , Rfl:  Allergies  Allergen Reactions   Penicillins Other (See Comments)    Caused convulsions as a child  Did it involve swelling of the face/tongue/throat, SOB, or low BP? N/A Did it involve sudden or severe rash/hives, skin peeling, or any reaction on the inside of your mouth or nose? N/A Did you need to seek medical attention at a hospital or doctor's office?N/A When did it last happen? N/A If all above answers are NO, may proceed with cephalosporin use.     Social History   Socioeconomic History   Marital status: Single    Spouse name: Not on  file   Number of children: Not on file   Years of education: Not on file   Highest education level: Not on file  Occupational History   Not on file  Tobacco Use   Smoking status: Never   Smokeless tobacco: Never  Vaping Use   Vaping status: Never Used  Substance and Sexual Activity   Alcohol use: Not Currently   Drug use: Not Currently    Types: Marijuana    Comment: Smoked for 30 years   Sexual activity: Not on file  Other Topics Concern   Not on file  Social History Narrative   Not on file   Social Drivers of Health   Financial Resource Strain: Medium Risk (11/15/2020)   Overall Financial Resource Strain (CARDIA)    Difficulty of Paying Living Expenses: Somewhat hard  Food Insecurity: No Food Insecurity (03/08/2022)   Hunger Vital Sign    Worried About Running Out of Food in the Last Year: Never true    Ran Out of Food in the Last Year: Never true  Transportation Needs: Unmet Transportation Needs (04/21/2022)   PRAPARE - Administrator, Civil Service (Medical): Yes    Lack of Transportation (Non-Medical): Yes  Physical Activity: Not on file  Stress: Not on file  Social Connections: Not on file  Intimate Partner Violence: Not At Risk (03/08/2022)   Humiliation, Afraid, Rape, and Kick questionnaire    Fear of Current or Ex-Partner: No    Emotionally Abused: No    Physically Abused: No    Sexually Abused: No    Physical Exam      Future Appointments  Date Time Provider Department Center  11/26/2023 12:00 PM Rolan Ezra RAMAN, MD [facility] None

## 2023-10-20 DIAGNOSIS — R32 Unspecified urinary incontinence: Secondary | ICD-10-CM | POA: Diagnosis not present

## 2023-10-24 ENCOUNTER — Inpatient Hospital Stay (HOSPITAL_COMMUNITY)
Admission: EM | Admit: 2023-10-24 | Discharge: 2023-11-04 | DRG: 563 | Disposition: A | Discharge status: Skilled Nursing Facility | Attending: Internal Medicine | Admitting: Internal Medicine

## 2023-10-24 ENCOUNTER — Emergency Department (HOSPITAL_COMMUNITY)

## 2023-10-24 ENCOUNTER — Other Ambulatory Visit: Payer: Self-pay

## 2023-10-24 ENCOUNTER — Encounter (HOSPITAL_COMMUNITY): Payer: Self-pay

## 2023-10-24 DIAGNOSIS — S199XXA Unspecified injury of neck, initial encounter: Secondary | ICD-10-CM | POA: Diagnosis not present

## 2023-10-24 DIAGNOSIS — Z86711 Personal history of pulmonary embolism: Secondary | ICD-10-CM

## 2023-10-24 DIAGNOSIS — M1712 Unilateral primary osteoarthritis, left knee: Secondary | ICD-10-CM | POA: Diagnosis not present

## 2023-10-24 DIAGNOSIS — T502X5A Adverse effect of carbonic-anhydrase inhibitors, benzothiadiazides and other diuretics, initial encounter: Secondary | ICD-10-CM | POA: Diagnosis not present

## 2023-10-24 DIAGNOSIS — R531 Weakness: Secondary | ICD-10-CM | POA: Diagnosis not present

## 2023-10-24 DIAGNOSIS — S2241XA Multiple fractures of ribs, right side, initial encounter for closed fracture: Secondary | ICD-10-CM | POA: Diagnosis not present

## 2023-10-24 DIAGNOSIS — Z7401 Bed confinement status: Secondary | ICD-10-CM | POA: Diagnosis not present

## 2023-10-24 DIAGNOSIS — R58 Hemorrhage, not elsewhere classified: Secondary | ICD-10-CM | POA: Diagnosis not present

## 2023-10-24 DIAGNOSIS — D649 Anemia, unspecified: Principal | ICD-10-CM

## 2023-10-24 DIAGNOSIS — I5022 Chronic systolic (congestive) heart failure: Secondary | ICD-10-CM | POA: Diagnosis not present

## 2023-10-24 DIAGNOSIS — G894 Chronic pain syndrome: Secondary | ICD-10-CM | POA: Diagnosis present

## 2023-10-24 DIAGNOSIS — I5082 Biventricular heart failure: Secondary | ICD-10-CM | POA: Diagnosis present

## 2023-10-24 DIAGNOSIS — M549 Dorsalgia, unspecified: Secondary | ICD-10-CM | POA: Diagnosis present

## 2023-10-24 DIAGNOSIS — Z86718 Personal history of other venous thrombosis and embolism: Secondary | ICD-10-CM

## 2023-10-24 DIAGNOSIS — Z880 Allergy status to penicillin: Secondary | ICD-10-CM

## 2023-10-24 DIAGNOSIS — W010XXA Fall on same level from slipping, tripping and stumbling without subsequent striking against object, initial encounter: Secondary | ICD-10-CM | POA: Diagnosis present

## 2023-10-24 DIAGNOSIS — Y9301 Activity, walking, marching and hiking: Secondary | ICD-10-CM | POA: Diagnosis present

## 2023-10-24 DIAGNOSIS — I872 Venous insufficiency (chronic) (peripheral): Secondary | ICD-10-CM | POA: Diagnosis not present

## 2023-10-24 DIAGNOSIS — K766 Portal hypertension: Secondary | ICD-10-CM | POA: Diagnosis present

## 2023-10-24 DIAGNOSIS — K7460 Unspecified cirrhosis of liver: Secondary | ICD-10-CM | POA: Diagnosis present

## 2023-10-24 DIAGNOSIS — D62 Acute posthemorrhagic anemia: Secondary | ICD-10-CM | POA: Diagnosis present

## 2023-10-24 DIAGNOSIS — G4700 Insomnia, unspecified: Secondary | ICD-10-CM | POA: Diagnosis present

## 2023-10-24 DIAGNOSIS — J449 Chronic obstructive pulmonary disease, unspecified: Secondary | ICD-10-CM | POA: Diagnosis present

## 2023-10-24 DIAGNOSIS — Z6831 Body mass index (BMI) 31.0-31.9, adult: Secondary | ICD-10-CM

## 2023-10-24 DIAGNOSIS — G4733 Obstructive sleep apnea (adult) (pediatric): Secondary | ICD-10-CM | POA: Diagnosis present

## 2023-10-24 DIAGNOSIS — E66811 Obesity, class 1: Secondary | ICD-10-CM | POA: Diagnosis present

## 2023-10-24 DIAGNOSIS — Z8249 Family history of ischemic heart disease and other diseases of the circulatory system: Secondary | ICD-10-CM | POA: Diagnosis not present

## 2023-10-24 DIAGNOSIS — D500 Iron deficiency anemia secondary to blood loss (chronic): Secondary | ICD-10-CM | POA: Diagnosis not present

## 2023-10-24 DIAGNOSIS — S0990XA Unspecified injury of head, initial encounter: Secondary | ICD-10-CM | POA: Diagnosis not present

## 2023-10-24 DIAGNOSIS — M542 Cervicalgia: Secondary | ICD-10-CM | POA: Diagnosis present

## 2023-10-24 DIAGNOSIS — Z5986 Financial insecurity: Secondary | ICD-10-CM

## 2023-10-24 DIAGNOSIS — S42291A Other displaced fracture of upper end of right humerus, initial encounter for closed fracture: Secondary | ICD-10-CM

## 2023-10-24 DIAGNOSIS — S42201A Unspecified fracture of upper end of right humerus, initial encounter for closed fracture: Secondary | ICD-10-CM | POA: Diagnosis not present

## 2023-10-24 DIAGNOSIS — E042 Nontoxic multinodular goiter: Secondary | ICD-10-CM | POA: Diagnosis not present

## 2023-10-24 DIAGNOSIS — E119 Type 2 diabetes mellitus without complications: Secondary | ICD-10-CM | POA: Diagnosis present

## 2023-10-24 DIAGNOSIS — F32A Depression, unspecified: Secondary | ICD-10-CM | POA: Diagnosis present

## 2023-10-24 DIAGNOSIS — Y92009 Unspecified place in unspecified non-institutional (private) residence as the place of occurrence of the external cause: Secondary | ICD-10-CM | POA: Diagnosis not present

## 2023-10-24 DIAGNOSIS — S42351A Displaced comminuted fracture of shaft of humerus, right arm, initial encounter for closed fracture: Secondary | ICD-10-CM | POA: Diagnosis not present

## 2023-10-24 DIAGNOSIS — E876 Hypokalemia: Secondary | ICD-10-CM | POA: Diagnosis not present

## 2023-10-24 DIAGNOSIS — E782 Mixed hyperlipidemia: Secondary | ICD-10-CM | POA: Diagnosis present

## 2023-10-24 DIAGNOSIS — S81812A Laceration without foreign body, left lower leg, initial encounter: Secondary | ICD-10-CM | POA: Diagnosis present

## 2023-10-24 DIAGNOSIS — Z23 Encounter for immunization: Secondary | ICD-10-CM

## 2023-10-24 DIAGNOSIS — Z83438 Family history of other disorder of lipoprotein metabolism and other lipidemia: Secondary | ICD-10-CM

## 2023-10-24 DIAGNOSIS — I480 Paroxysmal atrial fibrillation: Secondary | ICD-10-CM | POA: Diagnosis present

## 2023-10-24 DIAGNOSIS — Z5982 Transportation insecurity: Secondary | ICD-10-CM

## 2023-10-24 DIAGNOSIS — M19012 Primary osteoarthritis, left shoulder: Secondary | ICD-10-CM | POA: Diagnosis not present

## 2023-10-24 DIAGNOSIS — I5032 Chronic diastolic (congestive) heart failure: Secondary | ICD-10-CM | POA: Diagnosis present

## 2023-10-24 DIAGNOSIS — M25519 Pain in unspecified shoulder: Secondary | ICD-10-CM | POA: Diagnosis not present

## 2023-10-24 DIAGNOSIS — D509 Iron deficiency anemia, unspecified: Secondary | ICD-10-CM | POA: Diagnosis present

## 2023-10-24 DIAGNOSIS — S43002A Unspecified subluxation of left shoulder joint, initial encounter: Secondary | ICD-10-CM | POA: Diagnosis not present

## 2023-10-24 DIAGNOSIS — I110 Hypertensive heart disease with heart failure: Secondary | ICD-10-CM | POA: Diagnosis present

## 2023-10-24 DIAGNOSIS — S42231A 3-part fracture of surgical neck of right humerus, initial encounter for closed fracture: Secondary | ICD-10-CM | POA: Diagnosis present

## 2023-10-24 DIAGNOSIS — K5900 Constipation, unspecified: Secondary | ICD-10-CM | POA: Diagnosis present

## 2023-10-24 DIAGNOSIS — K760 Fatty (change of) liver, not elsewhere classified: Secondary | ICD-10-CM | POA: Diagnosis present

## 2023-10-24 DIAGNOSIS — F419 Anxiety disorder, unspecified: Secondary | ICD-10-CM | POA: Diagnosis present

## 2023-10-24 DIAGNOSIS — M79605 Pain in left leg: Secondary | ICD-10-CM | POA: Diagnosis not present

## 2023-10-24 DIAGNOSIS — Z7901 Long term (current) use of anticoagulants: Secondary | ICD-10-CM

## 2023-10-24 DIAGNOSIS — S42291D Other displaced fracture of upper end of right humerus, subsequent encounter for fracture with routine healing: Secondary | ICD-10-CM | POA: Diagnosis not present

## 2023-10-24 DIAGNOSIS — D638 Anemia in other chronic diseases classified elsewhere: Secondary | ICD-10-CM | POA: Diagnosis present

## 2023-10-24 DIAGNOSIS — G319 Degenerative disease of nervous system, unspecified: Secondary | ICD-10-CM | POA: Diagnosis not present

## 2023-10-24 DIAGNOSIS — K3189 Other diseases of stomach and duodenum: Secondary | ICD-10-CM | POA: Diagnosis present

## 2023-10-24 DIAGNOSIS — R918 Other nonspecific abnormal finding of lung field: Secondary | ICD-10-CM | POA: Diagnosis not present

## 2023-10-24 DIAGNOSIS — S81811A Laceration without foreign body, right lower leg, initial encounter: Secondary | ICD-10-CM | POA: Diagnosis not present

## 2023-10-24 DIAGNOSIS — Z79899 Other long term (current) drug therapy: Secondary | ICD-10-CM

## 2023-10-24 DIAGNOSIS — I890 Lymphedema, not elsewhere classified: Secondary | ICD-10-CM | POA: Diagnosis present

## 2023-10-24 DIAGNOSIS — W19XXXA Unspecified fall, initial encounter: Secondary | ICD-10-CM | POA: Diagnosis present

## 2023-10-24 DIAGNOSIS — M25511 Pain in right shoulder: Secondary | ICD-10-CM | POA: Diagnosis present

## 2023-10-24 DIAGNOSIS — S81811D Laceration without foreign body, right lower leg, subsequent encounter: Secondary | ICD-10-CM | POA: Diagnosis not present

## 2023-10-24 LAB — COMPREHENSIVE METABOLIC PANEL WITH GFR
ALT: 14 U/L (ref 0–44)
AST: 32 U/L (ref 15–41)
Albumin: 3.2 g/dL — ABNORMAL LOW (ref 3.5–5.0)
Alkaline Phosphatase: 76 U/L (ref 38–126)
Anion gap: 12 (ref 5–15)
BUN: 20 mg/dL (ref 8–23)
CO2: 29 mmol/L (ref 22–32)
Calcium: 8.5 mg/dL — ABNORMAL LOW (ref 8.9–10.3)
Chloride: 94 mmol/L — ABNORMAL LOW (ref 98–111)
Creatinine, Ser: 0.91 mg/dL (ref 0.61–1.24)
GFR, Estimated: >60 mL/min (ref >60)
Glucose, Bld: 118 mg/dL — ABNORMAL HIGH (ref 70–99)
Potassium: 3.5 mmol/L (ref 3.5–5.1)
Sodium: 135 mmol/L (ref 135–145)
Total Bilirubin: 1.1 mg/dL (ref 0.0–1.2)
Total Protein: 6.7 g/dL (ref 6.5–8.1)

## 2023-10-24 LAB — CBC
HCT: 28.4 % — ABNORMAL LOW (ref 39.0–52.0)
HCT: 26.1 % — ABNORMAL LOW (ref 39.0–52.0)
Hemoglobin: 7.9 g/dL — ABNORMAL LOW (ref 13.0–17.0)
Hemoglobin: 7.3 g/dL — ABNORMAL LOW (ref 13.0–17.0)
MCH: 19.3 pg — ABNORMAL LOW (ref 26.0–34.0)
MCH: 19.2 pg — ABNORMAL LOW (ref 26.0–34.0)
MCHC: 27.8 g/dL — ABNORMAL LOW (ref 30.0–36.0)
MCHC: 28 g/dL — ABNORMAL LOW (ref 30.0–36.0)
MCV: 69.4 fL — ABNORMAL LOW (ref 80.0–100.0)
MCV: 68.7 fL — ABNORMAL LOW (ref 80.0–100.0)
Platelets: 129 K/uL — ABNORMAL LOW (ref 150–400)
Platelets: 143 K/uL — ABNORMAL LOW (ref 150–400)
RBC: 4.09 MIL/uL — ABNORMAL LOW (ref 4.22–5.81)
RBC: 3.8 MIL/uL — ABNORMAL LOW (ref 4.22–5.81)
RDW: 17.5 % — ABNORMAL HIGH (ref 11.5–15.5)
RDW: 17.4 % — ABNORMAL HIGH (ref 11.5–15.5)
WBC: 5.5 K/uL (ref 4.0–10.5)
WBC: 9.3 K/uL (ref 4.0–10.5)
nRBC: 0 % (ref 0.0–0.2)
nRBC: 0 % (ref 0.0–0.2)

## 2023-10-24 LAB — I-STAT CHEM 8, ED
BUN: 24 mg/dL — ABNORMAL HIGH (ref 8–23)
Calcium, Ion: 0.96 mmol/L — ABNORMAL LOW (ref 1.15–1.40)
Chloride: 95 mmol/L — ABNORMAL LOW (ref 98–111)
Creatinine, Ser: 1 mg/dL (ref 0.61–1.24)
Glucose, Bld: 116 mg/dL — ABNORMAL HIGH (ref 70–99)
HCT: 27 % — ABNORMAL LOW (ref 39.0–52.0)
Hemoglobin: 9.2 g/dL — ABNORMAL LOW (ref 13.0–17.0)
Potassium: 3.5 mmol/L (ref 3.5–5.1)
Sodium: 136 mmol/L (ref 135–145)
TCO2: 33 mmol/L — ABNORMAL HIGH (ref 22–32)

## 2023-10-24 LAB — I-STAT CG4 LACTIC ACID, ED: Lactic Acid, Venous: 2 mmol/L (ref 0.5–1.9)

## 2023-10-24 LAB — URINALYSIS, ROUTINE W REFLEX MICROSCOPIC
Bilirubin Urine: NEGATIVE
Glucose, UA: NEGATIVE mg/dL
Hgb urine dipstick: NEGATIVE
Ketones, ur: NEGATIVE mg/dL
Leukocytes,Ua: NEGATIVE
Nitrite: NEGATIVE
Protein, ur: NEGATIVE mg/dL
Specific Gravity, Urine: 1.008 (ref 1.005–1.030)
pH: 7 (ref 5.0–8.0)

## 2023-10-24 LAB — SAMPLE TO BLOOD BANK

## 2023-10-24 LAB — PROTIME-INR
INR: 1.4 — ABNORMAL HIGH (ref 0.8–1.2)
Prothrombin Time: 18.2 s — ABNORMAL HIGH (ref 11.4–15.2)

## 2023-10-24 LAB — ETHANOL: Alcohol, Ethyl (B): <15 mg/dL (ref <15)

## 2023-10-24 MED ORDER — FENTANYL CITRATE PF 50 MCG/ML IJ SOSY
50.0000 ug | PREFILLED_SYRINGE | Freq: Once | INTRAMUSCULAR | Status: AC
  Administered 2023-10-24: 50 ug via INTRAVENOUS
  Filled 2023-10-24: qty 1

## 2023-10-24 MED ORDER — TETANUS-DIPHTH-ACELL PERTUSSIS 5-2.5-18.5 LF-MCG/0.5 IM SUSY
0.5000 mL | PREFILLED_SYRINGE | Freq: Once | INTRAMUSCULAR | Status: AC
  Administered 2023-10-25: 0.5 mL via INTRAMUSCULAR
  Filled 2023-10-24: qty 0.5

## 2023-10-24 MED ORDER — ONDANSETRON HCL 4 MG/2ML IJ SOLN
4.0000 mg | Freq: Once | INTRAMUSCULAR | Status: AC
  Administered 2023-10-24: 4 mg via INTRAVENOUS
  Filled 2023-10-24: qty 2

## 2023-10-24 MED ORDER — LIDOCAINE-EPINEPHRINE (PF) 2 %-1:200000 IJ SOLN
10.0000 mL | Freq: Once | INTRAMUSCULAR | Status: AC
  Administered 2023-10-24: 10 mL
  Filled 2023-10-24: qty 20

## 2023-10-24 MED ORDER — VANCOMYCIN HCL 2000 MG/400ML IV SOLN
2000.0000 mg | Freq: Once | INTRAVENOUS | Status: AC
  Administered 2023-10-25: 2000 mg via INTRAVENOUS
  Filled 2023-10-24: qty 400

## 2023-10-24 NOTE — Progress Notes (Signed)
 ED Pharmacy Antibiotic Sign Off An antibiotic consult was received from an ED provider for Vancomycin   per pharmacy dosing for wound infection. A chart review was completed to assess appropriateness.   The following one time order(s) were placed:  Vancomycin  2000 mg IV  Further antibiotic and/or antibiotic pharmacy consults should be ordered by the admitting provider if indicated.   Dail Cordella Misty, [facility]  Clinical Pharmacist 10/24/23 11:36 PM

## 2023-10-24 NOTE — ED Triage Notes (Signed)
 Pt to ED with c/o fall while walking with walker around house. Pt fell onto rug. +LOC, +eliquis , skin tear to left lower leg anterior, bleeding controlled with gauze. Pt does not arrive in c-collar. Pt educated on benefits of wearing c-collar and risks of not wearing c-collar. Pt refusing c-collar at this time. Pt c/o right shoulder pain. +PMS to bilateral extremities. Pt maintaining own airway and secretions. Pt breathing even and unlabored. Bleeding controlled with dressing. PERRL. Warm blankets provided.   142/70 Manual BP

## 2023-10-24 NOTE — ED Provider Notes (Signed)
 [place] EMERGENCY DEPARTMENT AT [facility] Provider Note   CSN: [id number] Arrival date & time: 10/24/23  1830     Patient presents with: [place]   Charles Gray is a 68 y.o. male.   68 year old male with past medical history of significant lymphedema who is on Eliquis  presenting to the emergency department today with left leg injury, right shoulder injury, and closed head injury after falling at home.  The patient states that he was turning around after giving his cat a treat and lost his footing.  He fell on his right shoulder and cut his leg somehow.  He was brought to the ER for further evaluation regarding this at that time.  The patient is on Suboxone  on his baseline for chronic pain.  He was complaining of neck pain as well but refused a c-collar after discussion of reasoning behind placing c-collar's and acute trauma and the patient does have capacity to make his own medical decisions and declines this.   Fall       Prior to Admission medications   Medication Sig Start Date End Date Taking? Authorizing Provider  apixaban  (ELIQUIS ) 5 MG TABS tablet Take 1 tablet (5 mg total) by mouth 2 (two) times daily. 11/19/21   Bensimhon, Toribio SAUNDERS, MD  Buprenorphine  HCl-Naloxone  HCl 8-2 MG FILM Place 1 Film under the tongue in the morning and at bedtime.    [provider]  cariprazine (VRAYLAR) 1.5 MG capsule Take 1.5 mg by mouth daily. Per Dr. Elaine    [provider]  eplerenone  (INSPRA ) 50 MG tablet TAKE TWO TABLETS BY MOUTH DAILY 10/13/23   Milford, Jessica M, FNP  gabapentin  (NEURONTIN ) 800 MG tablet Take 1 tablet (800 mg total) by mouth 3 (three) times daily. 03/23/19   Angiulli, Toribio PARAS, PA-C  lactulose , encephalopathy, (CHRONULAC ) 10 GM/15ML SOLN Take 30 mLs (20 g total) by mouth 2 (two) times daily. 03/11/22   Fairy Frames, MD  metolazone  (ZAROXOLYN ) 2.5 MG tablet Take 1 tablet (2.5 mg total) by mouth 3 (three) times a week. on Mondays Wednesdays  and Fridays 05/28/23   Rolan Ezra RAMAN, MD  metoprolol  succinate (TOPROL -XL) 25 MG 24 hr tablet TAKE ONE TABLET BY MOUTH AT BEDTIME 03/23/23   McLean, Dalton S, MD  ondansetron  (ZOFRAN -ODT) 4 MG disintegrating tablet Take 4 mg by mouth every 8 (eight) hours as needed for nausea or vomiting (dissolve orally).    [provider]  pantoprazole  (PROTONIX ) 40 MG tablet Take 40 mg by mouth 2 (two) times daily before a meal.    [provider]  polyethylene glycol (MIRALAX  / GLYCOLAX ) 17 g packet Take 17 g by mouth as needed for mild constipation (mix and drink as directed).    [provider]  potassium chloride  (KLOR-CON ) 20 MEQ packet Take 100 mEq by mouth 3 (three) times daily. Take an extra 40 mEq on days when you take metolazone  05/11/22   Rolan Ezra RAMAN, MD  pravastatin  (PRAVACHOL ) 20 MG tablet Take 1 tablet by mouth daily. 09/16/23   Rolan Ezra RAMAN, MD  sertraline  (ZOLOFT ) 100 MG tablet Take 100 mg by mouth daily. At night    [provider]  tamsulosin  (FLOMAX ) 0.4 MG CAPS capsule Take 1 capsule (0.4 mg total) by mouth daily. Additional refills will need to come from PCP 04/01/22   Rolan Ezra RAMAN, MD  torsemide  (DEMADEX ) 100 MG tablet TAKE ONE TABLET BY MOUTH DAILY IN THE MORNING AND EVENING 09/16/23   McLean, Dalton  S, MD  triamcinolone  (KENALOG ) 0.025 % ointment Apply 1 application. topically 2 (two) times daily. 09/02/21   Vu, Constance DASEN, MD  VENTOLIN  HFA 108 (90 Base) MCG/ACT inhaler Inhale 1 puff into the lungs every 4 (four) hours as needed for shortness of breath. 03/23/19   Angiulli, Toribio PARAS, PA-C  zolpidem  (AMBIEN ) 10 MG tablet Take 1 tablet (10 mg total) by mouth at bedtime as needed for sleep. 12/23/20   Adhikari, Amrit, MD  bumetanide  (BUMEX ) 1 MG tablet Take 4 tablets (4 mg total) by mouth daily with breakfast AND 3 tablets (3 mg total) every evening. 05/10/20 06/13/20  Rolan Ezra RAMAN, MD  metoprolol  tartrate (LOPRESSOR ) 50 MG tablet Take 1 tablet (50 mg  total) by mouth 2 (two) times daily. 02/09/20 04/11/20  Gonfa, Taye T, MD    Allergies: Penicillins    Review of Systems  Musculoskeletal:        Shoulder pain, neck pain, left leg pain    Updated Vital Signs BP (!) 112/57   Pulse 99   Temp 98.9 F (37.2 C) (Oral)   Resp (!) 21   Ht 6' 10 (2.083 m)   Wt (!) 138.3 kg   SpO2 100%   BMI 31.89 kg/m   Physical Exam Vitals and nursing note reviewed.   Gen: NAD Eyes: PERRL, EOMI HEENT: no oropharyngeal swelling Neck: trachea midline, + cervical spine tenderness, no stepoffs or deformities, patient refused c-collar Resp: clear to auscultation bilaterally Card: RRR, no murmurs, rubs, or gallops Abd: nontender, nondistended, no seatbelt sign Extremities: no calf tenderness, no edema MSK: no thoracic spinal tenderness, no lumbar spinal tenderness, no step-offs or deformities, the patient is tender over the right proximal humerus with no obvious deformity noted, there is a large skin tear/avulsion over the left shin with minimal oozing noted, no active brisk bleeding is noted the remainder of the extremities are atraumatic Vascular: 2+ radial pulses bilaterally, dopplerable DP and PT pulses bilaterally Neuro: Alert and oriented x 3, equal strength sensation throughout bilateral upper and lower extremities Skin: no rashes    (all labs ordered are listed, but only abnormal results are displayed) Labs Reviewed  COMPREHENSIVE METABOLIC PANEL WITH GFR - Abnormal; Notable for the following components:      Result Value   Chloride 94 (*)    Glucose, Bld 118 (*)    Calcium 8.5 (*)    Albumin  3.2 (*)    All other components within normal limits  CBC - Abnormal; Notable for the following components:   RBC 4.09 (*)    Hemoglobin 7.9 (*)    HCT 28.4 (*)    MCV 69.4 (*)    MCH 19.3 (*)    MCHC 27.8 (*)    RDW 17.5 (*)    Platelets 129 (*)    All other components within normal limits  URINALYSIS, ROUTINE W REFLEX MICROSCOPIC -  Abnormal; Notable for the following components:   Color, Urine STRAW (*)    All other components within normal limits  PROTIME-INR - Abnormal; Notable for the following components:   Prothrombin Time 18.2 (*)    INR 1.4 (*)    All other components within normal limits  CBC - Abnormal; Notable for the following components:   RBC 3.80 (*)    Hemoglobin 7.3 (*)    HCT 26.1 (*)    MCV 68.7 (*)    MCH 19.2 (*)    MCHC 28.0 (*)    RDW 17.4 (*)  Platelets 143 (*)    All other components within normal limits  I-STAT CHEM 8, ED - Abnormal; Notable for the following components:   Chloride 95 (*)    BUN 24 (*)    Glucose, Bld 116 (*)    Calcium, Ion 0.96 (*)    TCO2 33 (*)    Hemoglobin 9.2 (*)    HCT 27.0 (*)    All other components within normal limits  I-STAT CG4 LACTIC ACID, ED - Abnormal; Notable for the following components:   Lactic Acid, Venous 2.0 (*)    All other components within normal limits  ETHANOL  I-STAT CHEM 8, ED  SAMPLE TO BLOOD BANK    EKG: None  Radiology: CT CERVICAL SPINE WO CONTRAST Result Date: 10/24/2023 CLINICAL DATA:  Fall with moderate to severe head and neck trauma. EXAM: CT HEAD WITHOUT CONTRAST CT CERVICAL SPINE WITHOUT CONTRAST TECHNIQUE: Multidetector CT imaging of the head and cervical spine was performed following the standard protocol without intravenous contrast. Multiplanar CT image reconstructions of the cervical spine were also generated. RADIATION DOSE REDUCTION: This exam was performed according to the departmental dose-optimization program which includes automated exposure control, adjustment of the mA and/or kV according to patient size and/or use of iterative reconstruction technique. COMPARISON:  Head CT 07/07/2021, CT scan cervical spine 10/28/2020. FINDINGS: CT HEAD FINDINGS Brain: There are mild features of cerebral atrophy and small-vessel disease. Cerebellum and brainstem are unremarkable. Ventricles are normal in size and position.  Chronic dystrophic calcification extends along the frontal falx. No hemorrhage, cortical based acute infarct, mass or mass effect are seen. Basal cisterns are clear. Vascular: No hyperdense vessel or unexpected calcification. Skull: Negative for fractures or focal lesions. No visible scalp hematoma. Sinuses/Orbits: Mild membrane disease both maxillary sinuses. Other paranasal sinuses, bilateral mastoids, and middle ear cavities are clear. S shaped nasal septum with left-sided spurring. Other: None. CT CERVICAL SPINE FINDINGS Alignment: Within normal limits apart from bone-on-bone anterior atlantodental joint space loss with osteophytes, seen previously. Skull base and vertebrae: Normal bone mineralization. Motion artifact limits imaging through the upper 3 cervical levels. Accounting for motion, no acute cervical fracture or focal pathologic process is suspected. Soft tissues and spinal canal: No prevertebral fluid or swelling. No visible canal hematoma. Trace calcific plaque both proximal cervical ICAs. There is a heterogeneous 2.2 cm in nodule in the left lobe of the thyroid  gland, 1.2 cm hypodense nodule in the right lobe. These are not grossly changed in size. The nodules have been evaluated previously on thyroid  ultrasound 08/14/2020 with the left-sided nodule recommended for a 1 year follow-up exam. This is the last thyroid  ultrasound in our system. If this nodule has not been restudied elsewhere, nonemergent thyroid  ultrasound follow-up is recommended to compare with the 2022 study. Visualized larynx demonstrates no mass. Portions were excluded from the field anteriorly. Disc levels: The discs are diffusely degenerated particularly from C4-5 through C7-T1 where there is complete chronic disc collapse. There are bidirectional endplate osteophytes all levels. Short pedicles exacerbate the mass effect from posterior disc osteophyte complexes, and reduce the effective AP canal diameter from C3-7. As a result,  there is mild spondylotic cord compression with spinal stenosis at C3-4, spondylotic compression of the left hemicord at C4-5, with broad-based spondylotic cord compression and spinal stenosis C5-6 and C6-7. There are multilevel uncinate and facet osteophytes and acquired foraminal stenosis, greatest on the right at C4-5, with severe foraminal narrowing bilaterally at C5-6 and C6-7. Upper chest: Negative. Other: None.  IMPRESSION: 1. No acute intracranial CT findings or depressed skull fractures. 2. Atrophy and small-vessel disease.  Stable exam. 3. Motion artifact limits imaging through the upper 3 cervical levels. Accounting for this, no acute cervical fractures are suspected. 4. Multilevel cervical degenerative change, spondylotic cord compression and acquired foraminal stenosis. 5. Bilateral thyroid  nodules, not grossly changed in size. Follow-up was recommended in 2022. If there has not been intervening thyroid  ultrasound elsewhere, nonemergent follow-up ultrasound is recommended. 6. Mild maxillary sinus membrane disease.  S shaped nasal septum. 7. Trace proximal cervical ICA atherosclerosis. Electronically Signed   By: Francis Quam M.D.   On: 10/24/2023 21:43   CT HEAD WO CONTRAST Result Date: 10/24/2023 CLINICAL DATA:  Fall with moderate to severe head and neck trauma. EXAM: CT HEAD WITHOUT CONTRAST CT CERVICAL SPINE WITHOUT CONTRAST TECHNIQUE: Multidetector CT imaging of the head and cervical spine was performed following the standard protocol without intravenous contrast. Multiplanar CT image reconstructions of the cervical spine were also generated. RADIATION DOSE REDUCTION: This exam was performed according to the departmental dose-optimization program which includes automated exposure control, adjustment of the mA and/or kV according to patient size and/or use of iterative reconstruction technique. COMPARISON:  Head CT 07/07/2021, CT scan cervical spine 10/28/2020. FINDINGS: CT HEAD FINDINGS Brain:  There are mild features of cerebral atrophy and small-vessel disease. Cerebellum and brainstem are unremarkable. Ventricles are normal in size and position. Chronic dystrophic calcification extends along the frontal falx. No hemorrhage, cortical based acute infarct, mass or mass effect are seen. Basal cisterns are clear. Vascular: No hyperdense vessel or unexpected calcification. Skull: Negative for fractures or focal lesions. No visible scalp hematoma. Sinuses/Orbits: Mild membrane disease both maxillary sinuses. Other paranasal sinuses, bilateral mastoids, and middle ear cavities are clear. S shaped nasal septum with left-sided spurring. Other: None. CT CERVICAL SPINE FINDINGS Alignment: Within normal limits apart from bone-on-bone anterior atlantodental joint space loss with osteophytes, seen previously. Skull base and vertebrae: Normal bone mineralization. Motion artifact limits imaging through the upper 3 cervical levels. Accounting for motion, no acute cervical fracture or focal pathologic process is suspected. Soft tissues and spinal canal: No prevertebral fluid or swelling. No visible canal hematoma. Trace calcific plaque both proximal cervical ICAs. There is a heterogeneous 2.2 cm in nodule in the left lobe of the thyroid  gland, 1.2 cm hypodense nodule in the right lobe. These are not grossly changed in size. The nodules have been evaluated previously on thyroid  ultrasound 08/14/2020 with the left-sided nodule recommended for a 1 year follow-up exam. This is the last thyroid  ultrasound in our system. If this nodule has not been restudied elsewhere, nonemergent thyroid  ultrasound follow-up is recommended to compare with the 2022 study. Visualized larynx demonstrates no mass. Portions were excluded from the field anteriorly. Disc levels: The discs are diffusely degenerated particularly from C4-5 through C7-T1 where there is complete chronic disc collapse. There are bidirectional endplate osteophytes all  levels. Short pedicles exacerbate the mass effect from posterior disc osteophyte complexes, and reduce the effective AP canal diameter from C3-7. As a result, there is mild spondylotic cord compression with spinal stenosis at C3-4, spondylotic compression of the left hemicord at C4-5, with broad-based spondylotic cord compression and spinal stenosis C5-6 and C6-7. There are multilevel uncinate and facet osteophytes and acquired foraminal stenosis, greatest on the right at C4-5, with severe foraminal narrowing bilaterally at C5-6 and C6-7. Upper chest: Negative. Other: None. IMPRESSION: 1. No acute intracranial CT findings or depressed skull fractures. 2. Atrophy  and small-vessel disease.  Stable exam. 3. Motion artifact limits imaging through the upper 3 cervical levels. Accounting for this, no acute cervical fractures are suspected. 4. Multilevel cervical degenerative change, spondylotic cord compression and acquired foraminal stenosis. 5. Bilateral thyroid  nodules, not grossly changed in size. Follow-up was recommended in 2022. If there has not been intervening thyroid  ultrasound elsewhere, nonemergent follow-up ultrasound is recommended. 6. Mild maxillary sinus membrane disease.  S shaped nasal septum. 7. Trace proximal cervical ICA atherosclerosis. Electronically Signed   By: Francis Quam M.D.   On: 10/24/2023 21:43   DG Tibia/Fibula Left Result Date: 10/24/2023 CLINICAL DATA:  Left lower extremity pain.  Fall. EXAM: LEFT TIBIA AND FIBULA - 2 VIEW COMPARISON:  None Available. FINDINGS: There is no evidence of fracture or other focal bone lesions. No erosions or periostitis. Degenerative change of the knee. Generalized soft tissue edema. IMPRESSION: Generalized soft tissue edema. No acute osseous abnormality. Electronically Signed   By: Andrea Gasman M.D.   On: 10/24/2023 19:06   DG Shoulder Right Result Date: 10/24/2023 CLINICAL DATA:  Right shoulder pain after fall. EXAM: RIGHT SHOULDER - 2+ VIEW  COMPARISON:  10/28/2020 FINDINGS: Comminuted and displaced proximal humerus fracture. There is 9 mm displacement of a component involving the surgical neck. There is displacement involving the lateral humeral head. Displaced fragment is seen medially just inferior to the glenohumeral joint. No frank dislocation. Acromioclavicular degenerative spurring. IMPRESSION: Comminuted and displaced proximal humerus fracture. Electronically Signed   By: Andrea Gasman M.D.   On: 10/24/2023 19:05   DG Chest Port 1 View Result Date: 10/24/2023 CLINICAL DATA:  Trauma, right shoulder pain. EXAM: PORTABLE CHEST 1 VIEW COMPARISON:  Radiograph 03/04/2022, CT 07/08/2021 FINDINGS: Stable heart size and mediastinal contours. CardioMEMS on the left. Mild diffuse bronchial thickening. No pneumothorax, large pleural effusion or focal airspace disease. Remote right rib fractures. No evidence of acute rib fracture on portable exam. Right proximal humerus fracture is better assessed on concurrent shoulder exam. IMPRESSION: 1. Right proximal humeral fracture, assessed on concurrent shoulder exam. No evidence of additional acute traumatic injury. 2. Mild diffuse bronchial thickening. Electronically Signed   By: Andrea Gasman M.D.   On: 10/24/2023 19:04     Procedures   Medications Ordered in the ED  Tdap (BOOSTRIX ) injection 0.5 mL (has no administration in time range)  fentaNYL  (SUBLIMAZE ) injection 50 mcg (50 mcg Intravenous Given 10/24/23 1855)  fentaNYL  (SUBLIMAZE ) injection 50 mcg (50 mcg Intravenous Given 10/24/23 2000)  ondansetron  (ZOFRAN ) injection 4 mg (4 mg Intravenous Given 10/24/23 2000)  lidocaine -EPINEPHrine  (XYLOCAINE  W/EPI) 2 %-1:200000 (PF) injection 10 mL (10 mLs Infiltration Given 10/24/23 2254)  fentaNYL  (SUBLIMAZE ) injection 50 mcg (50 mcg Intravenous Given 10/24/23 2254)                                    Medical Decision Making 68 year old male with past medical history of advanced lymphedema who is  on Eliquis  presenting to the emergency department today with left leg injury and right shoulder pain after a mechanical fall.  Will further evaluate the patient here with a CT scan of his head and cervical spine in addition to x-rays of his right shoulder, chest, and left tibia/fibula to evaluate for acute traumatic injuries.  The patient does have dopplerable pulses and is otherwise neurovascularly intact.  Will give the patient fentanyl  for pain.  I will reevaluate for ultimate disposition.  The  patient was found to have a proximal humerus fracture on the right.  He is placed in a splint for this.  His hemoglobin is downtrending.  After anesthetizing and washing out his wound this does go down to the muscle layer.  There is significant gaping of the subcutaneous tissues and I am unable to approximate this.  The tissue over the lateral wound margins is devitalized.  I did attempt to approximate the subcutaneous tissues and this was unsuccessful.  The patient is covered with antibiotics.  Calls placed to surgery as well as the hospitalist service for admission.  Suspect he will likely need wound care as he is at high risk for chronic wounds given his underlying lymphedema at baseline which also is making closure of this wound difficult acutely.  I spoke with Dr. Teresa who recommended touching base with orthopedics.  Ice spoke with Dr. Genelle and he will see the patient tomorrow for the leg laceration.  Will continue with wet-to-dry dressings until he is evaluated tomorrow by orthopedics.  Amount and/or Complexity of Data Reviewed Labs: ordered. Radiology: ordered.  Risk Prescription drug management. Decision regarding hospitalization.        Final diagnoses:  Symptomatic anemia  Closed 3-part fracture of proximal end of right humerus, initial encounter  Leg laceration, right, initial encounter    ED Discharge Orders     None          Ula Prentice SAUNDERS, MD 10/24/23 2320    Ula Prentice SAUNDERS, MD 10/25/23 0020

## 2023-10-24 NOTE — Progress Notes (Signed)
 Orthopedic Tech Progress Note Patient Details:  Charles Gray 10-30-1955 [id number]  Patient ID: [name], male   DOB: 06/10/1955, 68 y.o.   MRN: [id number] Level II; not currently needed. Laymon DELENA Munroe 10/24/2023, 6:55 PM

## 2023-10-25 ENCOUNTER — Encounter (HOSPITAL_COMMUNITY): Admitting: Cardiology

## 2023-10-25 DIAGNOSIS — F32A Depression, unspecified: Secondary | ICD-10-CM | POA: Diagnosis present

## 2023-10-25 DIAGNOSIS — Y9301 Activity, walking, marching and hiking: Secondary | ICD-10-CM | POA: Diagnosis present

## 2023-10-25 DIAGNOSIS — S42291A Other displaced fracture of upper end of right humerus, initial encounter for closed fracture: Secondary | ICD-10-CM | POA: Diagnosis not present

## 2023-10-25 DIAGNOSIS — M549 Dorsalgia, unspecified: Secondary | ICD-10-CM | POA: Diagnosis present

## 2023-10-25 DIAGNOSIS — Z8249 Family history of ischemic heart disease and other diseases of the circulatory system: Secondary | ICD-10-CM | POA: Diagnosis not present

## 2023-10-25 DIAGNOSIS — Z23 Encounter for immunization: Secondary | ICD-10-CM | POA: Diagnosis present

## 2023-10-25 DIAGNOSIS — I5082 Biventricular heart failure: Secondary | ICD-10-CM | POA: Diagnosis present

## 2023-10-25 DIAGNOSIS — I480 Paroxysmal atrial fibrillation: Secondary | ICD-10-CM | POA: Diagnosis present

## 2023-10-25 DIAGNOSIS — D62 Acute posthemorrhagic anemia: Secondary | ICD-10-CM | POA: Diagnosis present

## 2023-10-25 DIAGNOSIS — Z6831 Body mass index (BMI) 31.0-31.9, adult: Secondary | ICD-10-CM | POA: Diagnosis not present

## 2023-10-25 DIAGNOSIS — W19XXXA Unspecified fall, initial encounter: Secondary | ICD-10-CM | POA: Diagnosis not present

## 2023-10-25 DIAGNOSIS — G894 Chronic pain syndrome: Secondary | ICD-10-CM | POA: Diagnosis present

## 2023-10-25 DIAGNOSIS — E782 Mixed hyperlipidemia: Secondary | ICD-10-CM | POA: Diagnosis present

## 2023-10-25 DIAGNOSIS — D638 Anemia in other chronic diseases classified elsewhere: Secondary | ICD-10-CM | POA: Diagnosis present

## 2023-10-25 DIAGNOSIS — Y92009 Unspecified place in unspecified non-institutional (private) residence as the place of occurrence of the external cause: Secondary | ICD-10-CM | POA: Diagnosis not present

## 2023-10-25 DIAGNOSIS — J449 Chronic obstructive pulmonary disease, unspecified: Secondary | ICD-10-CM | POA: Diagnosis present

## 2023-10-25 DIAGNOSIS — K766 Portal hypertension: Secondary | ICD-10-CM | POA: Diagnosis present

## 2023-10-25 DIAGNOSIS — S81811A Laceration without foreign body, right lower leg, initial encounter: Secondary | ICD-10-CM

## 2023-10-25 DIAGNOSIS — S81812A Laceration without foreign body, left lower leg, initial encounter: Secondary | ICD-10-CM | POA: Diagnosis present

## 2023-10-25 DIAGNOSIS — S42231A 3-part fracture of surgical neck of right humerus, initial encounter for closed fracture: Secondary | ICD-10-CM | POA: Diagnosis present

## 2023-10-25 DIAGNOSIS — K7460 Unspecified cirrhosis of liver: Secondary | ICD-10-CM | POA: Diagnosis present

## 2023-10-25 DIAGNOSIS — E66811 Obesity, class 1: Secondary | ICD-10-CM | POA: Diagnosis present

## 2023-10-25 DIAGNOSIS — Z7901 Long term (current) use of anticoagulants: Secondary | ICD-10-CM | POA: Diagnosis not present

## 2023-10-25 DIAGNOSIS — E119 Type 2 diabetes mellitus without complications: Secondary | ICD-10-CM | POA: Diagnosis present

## 2023-10-25 DIAGNOSIS — T502X5A Adverse effect of carbonic-anhydrase inhibitors, benzothiadiazides and other diuretics, initial encounter: Secondary | ICD-10-CM | POA: Diagnosis not present

## 2023-10-25 DIAGNOSIS — I5032 Chronic diastolic (congestive) heart failure: Secondary | ICD-10-CM | POA: Diagnosis present

## 2023-10-25 DIAGNOSIS — E876 Hypokalemia: Secondary | ICD-10-CM | POA: Diagnosis not present

## 2023-10-25 DIAGNOSIS — W010XXA Fall on same level from slipping, tripping and stumbling without subsequent striking against object, initial encounter: Secondary | ICD-10-CM | POA: Diagnosis present

## 2023-10-25 DIAGNOSIS — M25511 Pain in right shoulder: Secondary | ICD-10-CM | POA: Diagnosis present

## 2023-10-25 DIAGNOSIS — D509 Iron deficiency anemia, unspecified: Secondary | ICD-10-CM | POA: Diagnosis present

## 2023-10-25 DIAGNOSIS — I110 Hypertensive heart disease with heart failure: Secondary | ICD-10-CM | POA: Diagnosis present

## 2023-10-25 LAB — BASIC METABOLIC PANEL WITH GFR
Anion gap: 9 (ref 5–15)
BUN: 16 mg/dL (ref 8–23)
CO2: 30 mmol/L (ref 22–32)
Calcium: 8.7 mg/dL — ABNORMAL LOW (ref 8.9–10.3)
Chloride: 96 mmol/L — ABNORMAL LOW (ref 98–111)
Creatinine, Ser: 0.95 mg/dL (ref 0.61–1.24)
GFR, Estimated: >60 mL/min (ref >60)
Glucose, Bld: 127 mg/dL — ABNORMAL HIGH (ref 70–99)
Potassium: 3.1 mmol/L — ABNORMAL LOW (ref 3.5–5.1)
Sodium: 135 mmol/L (ref 135–145)

## 2023-10-25 LAB — PHOSPHORUS: Phosphorus: 3.5 mg/dL (ref 2.5–4.6)

## 2023-10-25 LAB — CBC
HCT: 24.6 % — ABNORMAL LOW (ref 39.0–52.0)
Hemoglobin: 7.1 g/dL — ABNORMAL LOW (ref 13.0–17.0)
MCH: 19.3 pg — ABNORMAL LOW (ref 26.0–34.0)
MCHC: 28.9 g/dL — ABNORMAL LOW (ref 30.0–36.0)
MCV: 67 fL — ABNORMAL LOW (ref 80.0–100.0)
Platelets: 130 K/uL — ABNORMAL LOW (ref 150–400)
RBC: 3.67 MIL/uL — ABNORMAL LOW (ref 4.22–5.81)
RDW: 17.5 % — ABNORMAL HIGH (ref 11.5–15.5)
WBC: 8.6 K/uL (ref 4.0–10.5)
nRBC: 0 % (ref 0.0–0.2)

## 2023-10-25 LAB — MAGNESIUM: Magnesium: 1.7 mg/dL (ref 1.7–2.4)

## 2023-10-25 LAB — HIV ANTIBODY (ROUTINE TESTING W REFLEX): HIV Screen 4th Generation wRfx: NONREACTIVE

## 2023-10-25 MED ORDER — POTASSIUM CHLORIDE 20 MEQ PO PACK
40.0000 meq | PACK | Freq: Two times a day (BID) | ORAL | Status: DC
  Filled 2023-10-25: qty 2

## 2023-10-25 MED ORDER — PANTOPRAZOLE SODIUM 40 MG PO TBEC
40.0000 mg | DELAYED_RELEASE_TABLET | Freq: Two times a day (BID) | ORAL | Status: DC
  Administered 2023-10-25 – 2023-11-04 (×20): 40 mg via ORAL
  Filled 2023-10-25 (×20): qty 1

## 2023-10-25 MED ORDER — LACTULOSE 10 GM/15ML PO SOLN
20.0000 g | Freq: Two times a day (BID) | ORAL | Status: DC
  Administered 2023-10-25 – 2023-10-30 (×8): 20 g via ORAL
  Filled 2023-10-25 (×17): qty 30

## 2023-10-25 MED ORDER — METOLAZONE 2.5 MG PO TABS
2.5000 mg | ORAL_TABLET | ORAL | Status: DC
  Administered 2023-11-03: 2.5 mg via ORAL
  Filled 2023-10-25 (×6): qty 1

## 2023-10-25 MED ORDER — LACTULOSE ENCEPHALOPATHY 10 GM/15ML PO SOLN
20.0000 g | Freq: Two times a day (BID) | ORAL | Status: DC
  Filled 2023-10-25 (×2): qty 30

## 2023-10-25 MED ORDER — HYDROMORPHONE HCL 1 MG/ML IJ SOLN
0.5000 mg | INTRAMUSCULAR | Status: AC
  Administered 2023-10-25: 0.5 mg via INTRAVENOUS
  Filled 2023-10-25: qty 1

## 2023-10-25 MED ORDER — FENTANYL CITRATE PF 50 MCG/ML IJ SOSY
100.0000 ug | PREFILLED_SYRINGE | INTRAMUSCULAR | Status: DC | PRN
  Administered 2023-10-25 – 2023-10-29 (×28): 100 ug via INTRAVENOUS
  Filled 2023-10-25 (×29): qty 2

## 2023-10-25 MED ORDER — SERTRALINE HCL 100 MG PO TABS
100.0000 mg | ORAL_TABLET | Freq: Every day | ORAL | Status: DC
  Administered 2023-10-25 – 2023-11-03 (×10): 100 mg via ORAL
  Filled 2023-10-25 (×10): qty 1

## 2023-10-25 MED ORDER — BUPRENORPHINE HCL-NALOXONE HCL 8-2 MG SL SUBL
1.0000 | SUBLINGUAL_TABLET | Freq: Every day | SUBLINGUAL | Status: DC
  Administered 2023-10-30 – 2023-11-02 (×2): 1 via SUBLINGUAL
  Filled 2023-10-25 (×6): qty 1

## 2023-10-25 MED ORDER — ALBUTEROL SULFATE (2.5 MG/3ML) 0.083% IN NEBU: 3.0000 mL | INHALATION_SOLUTION | RESPIRATORY_TRACT | Status: DC | PRN

## 2023-10-25 MED ORDER — BACITRACIN ZINC 500 UNIT/GM EX OINT
TOPICAL_OINTMENT | Freq: Every day | CUTANEOUS | Status: DC
  Administered 2023-10-26 – 2023-11-04 (×6): 31.5 via TOPICAL
  Filled 2023-10-25 (×3): qty 28.4

## 2023-10-25 MED ORDER — PRAVASTATIN SODIUM 40 MG PO TABS
20.0000 mg | ORAL_TABLET | Freq: Every day | ORAL | Status: DC
  Administered 2023-10-25 – 2023-11-03 (×10): 20 mg via ORAL
  Filled 2023-10-25 (×10): qty 1

## 2023-10-25 MED ORDER — SPIRONOLACTONE 25 MG PO TABS
25.0000 mg | ORAL_TABLET | Freq: Every day | ORAL | Status: DC
  Administered 2023-10-25 – 2023-11-03 (×10): 25 mg via ORAL
  Filled 2023-10-25 (×10): qty 1

## 2023-10-25 MED ORDER — APIXABAN 5 MG PO TABS
5.0000 mg | ORAL_TABLET | Freq: Two times a day (BID) | ORAL | Status: DC
  Administered 2023-10-25 – 2023-11-04 (×21): 5 mg via ORAL
  Filled 2023-10-25 (×21): qty 1

## 2023-10-25 MED ORDER — METOPROLOL SUCCINATE ER 25 MG PO TB24
25.0000 mg | ORAL_TABLET | Freq: Every day | ORAL | Status: DC
  Administered 2023-10-25 – 2023-11-03 (×10): 25 mg via ORAL
  Filled 2023-10-25 (×10): qty 1

## 2023-10-25 MED ORDER — POTASSIUM CHLORIDE CRYS ER 20 MEQ PO TBCR
40.0000 meq | EXTENDED_RELEASE_TABLET | Freq: Two times a day (BID) | ORAL | Status: DC
  Administered 2023-10-25 (×2): 40 meq via ORAL
  Filled 2023-10-25 (×2): qty 2

## 2023-10-25 MED ORDER — GABAPENTIN 400 MG PO CAPS
800.0000 mg | ORAL_CAPSULE | Freq: Three times a day (TID) | ORAL | Status: DC
  Administered 2023-10-25 – 2023-11-04 (×30): 800 mg via ORAL
  Filled 2023-10-25 (×30): qty 2

## 2023-10-25 MED ORDER — TAMSULOSIN HCL 0.4 MG PO CAPS
0.4000 mg | ORAL_CAPSULE | Freq: Every day | ORAL | Status: DC
  Administered 2023-10-25 – 2023-11-04 (×11): 0.4 mg via ORAL
  Filled 2023-10-25 (×11): qty 1

## 2023-10-25 MED ORDER — TORSEMIDE 20 MG PO TABS
100.0000 mg | ORAL_TABLET | Freq: Two times a day (BID) | ORAL | Status: DC
  Administered 2023-10-25 – 2023-11-04 (×20): 100 mg via ORAL
  Filled 2023-10-25 (×20): qty 5

## 2023-10-25 NOTE — Consult Note (Signed)
 ORTHOPAEDIC CONSULTATION  REQUESTING PHYSICIAN: Darci Pore, MD  Chief Complaint: Right shoulder fracture  HPI: Charles Gray is a 68 y.o. male who presents with presents after fall with hip right proximal humerus fracture as well as a laceration involving the left lower extremity.  This was dressed in the emergency room.  He does have quite a significant medical history including cirrhosis A-fib for which she is on anticoagulation and many other chronic comorbidities.  He has been placed in a sling in the right upper extremity  Past Medical History:  Diagnosis Date   Acute on chronic congestive heart failure (HCC)    Acute on chronic diastolic CHF (congestive heart failure) (HCC) 08/23/2019   Acute on chronic right-sided heart failure (HCC) 08/06/2019   Acute respiratory failure with hypoxia (HCC)    Anemia of chronic disease    Atrial fibrillation (HCC) 12/23/2019   Atrial tachycardia (HCC) 08/12/2019   Benign essential HTN    Chest pain 08/23/2019   Chronic anticoagulation 12/26/2019   Chronic heart failure with preserved EF 05/30/2019   Echo 07/2019: EF 55-60, GR 1 DD   Chronic pain syndrome    Cirrhosis of liver without ascites (HCC)    Class 2 severe obesity due to excess calories with serious comorbidity and body mass index (BMI) of 39.0 to 39.9 in adult (HCC) 05/30/2019   Debility 03/13/2019   Drug induced constipation    Dyspnea    Fatty liver disease, nonalcoholic    GIB (gastrointestinal bleeding) 03/04/2019   Heart failure, systolic, acute (HCC) 08/24/2019   History of pulmonary embolism 08/12/2019   Hyperlipidemia 01/24/2019   Hypertension 01/24/2019   Hypoalbuminemia due to protein-calorie malnutrition (HCC)    Hypokalemia    Hyponatremia    Hypotension due to drugs    Insomnia 01/24/2019   Lactic acidosis 08/23/2019   Lumbar disc herniation 01/24/2019   Mixed hyperlipidemia 01/24/2019   Morbid obesity with BMI of 40.0-44.9, adult (HCC) 05/30/2019    Normocytic anemia 06/08/2019   Occasional tremors 08/12/2019   OSA (obstructive sleep apnea) 08/12/2019   Palpitations 12/26/2019   Peripheral edema    Personal history of DVT (deep vein thrombosis) 12/26/2019   Portal hypertensive gastropathy (HCC) 03/06/2019   Seen on EGD 03/06/2019   Pulmonary emboli (HCC) 06/08/2019   Pulmonary embolism (HCC) 06/08/2019   Redness and swelling of lower leg 08/23/2019   Shock (HCC)    Thrombocytopenia (HCC)    Past Surgical History:  Procedure Laterality Date   BIOPSY  04/08/2021   Procedure: BIOPSY;  Surgeon: Aneita Gwendlyn DASEN, MD;  Location: [facility] ENDOSCOPY;  Service: Endoscopy;;   ESOPHAGOGASTRODUODENOSCOPY N/A 04/08/2021   Procedure: ESOPHAGOGASTRODUODENOSCOPY (EGD);  Surgeon: Aneita Gwendlyn DASEN, MD;  Location: [facility] ENDOSCOPY;  Service: Endoscopy;  Laterality: N/A;   ESOPHAGOGASTRODUODENOSCOPY (EGD) WITH PROPOFOL  N/A 03/05/2019   Procedure: ESOPHAGOGASTRODUODENOSCOPY (EGD) WITH PROPOFOL ;  Surgeon: Legrand Victory LITTIE DOUGLAS, MD;  Location: [facility] ENDOSCOPY;  Service: Gastroenterology;  Laterality: N/A;  Large Blood Clot Removed   ESOPHAGOGASTRODUODENOSCOPY (EGD) WITH PROPOFOL  N/A 03/06/2019   Procedure: ESOPHAGOGASTRODUODENOSCOPY (EGD) WITH PROPOFOL ;  Surgeon: Eda Iha, MD;  Location: [facility] ENDOSCOPY;  Service: Gastroenterology;  Laterality: N/A;   HEMORROIDECTOMY     PRESSURE SENSOR/CARDIOMEMS N/A 11/14/2020   Procedure: PRESSURE SENSOR/CARDIOMEMS;  Surgeon: Rolan Ezra RAMAN, MD;  Location: [facility] INVASIVE CV LAB;  Service: Cardiovascular;  Laterality: N/A;   RIGHT HEART CATH N/A 08/09/2019   Procedure: RIGHT HEART CATH;  Surgeon: Rolan Ezra RAMAN, MD;  Location: [facility] INVASIVE CV  LAB;  Service: Cardiovascular;  Laterality: N/A;   RIGHT HEART CATH N/A 11/01/2020   Procedure: RIGHT HEART CATH;  Surgeon: Rolan Ezra RAMAN, MD;  Location: [facility] INVASIVE CV LAB;  Service: Cardiovascular;  Laterality: N/A;   RIGHT HEART CATH N/A 11/14/2020   Procedure: RIGHT HEART CATH;  Surgeon: Rolan Ezra RAMAN, MD;  Location: [facility] INVASIVE CV LAB;  Service: Cardiovascular;  Laterality: N/A;   RIGHT/LEFT HEART CATH AND CORONARY ANGIOGRAPHY N/A 05/10/2020   Procedure: RIGHT/LEFT HEART CATH AND CORONARY ANGIOGRAPHY;  Surgeon: Rolan Ezra RAMAN, MD;  Location: [facility] INVASIVE CV LAB;  Service: Cardiovascular;  Laterality: N/A;   Social History   Socioeconomic History   Marital status: Single    Spouse name: Not on file   Number of children: Not on file   Years of education: Not on file   Highest education level: Not on file  Occupational History   Not on file  Tobacco Use   Smoking status: Never   Smokeless tobacco: Never  Vaping Use   Vaping status: Never Used  Substance and Sexual Activity   Alcohol use: Not Currently   Drug use: Not Currently    Types: Marijuana    Comment: Smoked for 30 years   Sexual activity: Not on file  Other Topics Concern   Not on file  Social History Narrative   Not on file   Social Drivers of Health   Financial Resource Strain: Medium Risk (11/15/2020)   Overall Financial Resource Strain (CARDIA)    Difficulty of Paying Living Expenses: Somewhat hard  Food Insecurity: No Food Insecurity (03/08/2022)   Hunger Vital Sign    Worried About Running Out of Food in the Last Year: Never true    Ran Out of Food in the Last Year: Never true  Transportation Needs: Unmet Transportation Needs (04/21/2022)   PRAPARE - Administrator, Civil Service (Medical): Yes    Lack of Transportation (Non-Medical): Yes  Physical Activity: Not on file  Stress: Not on file  Social Connections: Not on file   Family History  Problem Relation Age of Onset   Hyperlipidemia Mother    Hypertension Mother    Stroke Mother    CAD Maternal Grandmother    CAD Maternal Grandfather    - negative except otherwise stated in the family history section Allergies  Allergen Reactions   Penicillins Other (See Comments)    Caused convulsions as a child  Did it involve swelling  of the face/tongue/throat, SOB, or low BP? N/A Did it involve sudden or severe rash/hives, skin peeling, or any reaction on the inside of your mouth or nose? N/A Did you need to seek medical attention at a hospital or doctor's office?N/A When did it last happen? N/A If all above answers are NO, may proceed with cephalosporin use.   Prior to Admission medications   Medication Sig Start Date End Date Taking? Authorizing Provider  apixaban  (ELIQUIS ) 5 MG TABS tablet Take 1 tablet (5 mg total) by mouth 2 (two) times daily. 11/19/21  Yes Bensimhon, Toribio SAUNDERS, MD  Buprenorphine  HCl-Naloxone  HCl 8-2 MG FILM Place 1 Film under the tongue in the morning and at bedtime.   Yes [provider]  eplerenone  (INSPRA ) 50 MG tablet TAKE TWO TABLETS BY MOUTH DAILY 10/13/23  Yes Milford, Harlene HERO, FNP  gabapentin  (NEURONTIN ) 800 MG tablet Take 1 tablet (800 mg total) by mouth 3 (three) times daily. 03/23/19  Yes Angiulli, Toribio PARAS, PA-C  lactulose , encephalopathy, (  CHRONULAC ) 10 GM/15ML SOLN Take 30 mLs (20 g total) by mouth 2 (two) times daily. Patient taking differently: Take 20 g by mouth 2 (two) times daily as needed. 03/11/22  Yes Fairy Frames, MD  metolazone  (ZAROXOLYN ) 2.5 MG tablet Take 1 tablet (2.5 mg total) by mouth 3 (three) times a week. on Mondays Wednesdays and Fridays 05/28/23  Yes Rolan Ezra RAMAN, MD  metoprolol  succinate (TOPROL -XL) 25 MG 24 hr tablet TAKE ONE TABLET BY MOUTH AT BEDTIME 03/23/23  Yes McLean, Dalton S, MD  ondansetron  (ZOFRAN -ODT) 4 MG disintegrating tablet Take 4 mg by mouth every 8 (eight) hours as needed for nausea or vomiting (dissolve orally).   Yes [provider]  pantoprazole  (PROTONIX ) 40 MG tablet Take 40 mg by mouth 2 (two) times daily before a meal.   Yes [provider]  potassium chloride  (KLOR-CON ) 20 MEQ packet Take 100 mEq by mouth 3 (three) times daily. Take an extra 40 mEq on days when you take metolazone  05/11/22  Yes Rolan Ezra RAMAN,  MD  pravastatin  (PRAVACHOL ) 20 MG tablet Take 1 tablet by mouth daily. Patient taking differently: Take 20 mg by mouth at bedtime. 09/16/23  Yes Rolan Ezra RAMAN, MD  sertraline  (ZOLOFT ) 100 MG tablet Take 100 mg by mouth at bedtime. At night   Yes [provider]  tamsulosin  (FLOMAX ) 0.4 MG CAPS capsule Take 1 capsule (0.4 mg total) by mouth daily. Additional refills will need to come from PCP 04/01/22  Yes Rolan Ezra RAMAN, MD  torsemide  (DEMADEX ) 100 MG tablet TAKE ONE TABLET BY MOUTH DAILY IN THE MORNING AND EVENING 09/16/23  Yes Rolan Ezra RAMAN, MD  triamcinolone  (KENALOG ) 0.025 % cream Apply 1 Application topically 3 (three) times daily as needed (skin irritation). 08/25/23  Yes [provider]  VENTOLIN  HFA 108 (90 Base) MCG/ACT inhaler Inhale 1 puff into the lungs every 4 (four) hours as needed for shortness of breath. Patient taking differently: Inhale 2 puffs into the lungs every 4 (four) hours as needed for shortness of breath. 03/23/19  Yes Angiulli, Toribio PARAS, PA-C  zolpidem  (AMBIEN ) 10 MG tablet Take 1 tablet (10 mg total) by mouth at bedtime as needed for sleep. Patient taking differently: Take 10 mg by mouth at bedtime. 12/23/20  Yes Adhikari, Amrit, MD  polyethylene glycol (MIRALAX  / GLYCOLAX ) 17 g packet Take 17 g by mouth as needed for mild constipation (mix and drink as directed).    [provider]  bumetanide  (BUMEX ) 1 MG tablet Take 4 tablets (4 mg total) by mouth daily with breakfast AND 3 tablets (3 mg total) every evening. 05/10/20 06/13/20  Rolan Ezra RAMAN, MD  metoprolol  tartrate (LOPRESSOR ) 50 MG tablet Take 1 tablet (50 mg total) by mouth 2 (two) times daily. 02/09/20 04/11/20  Kathrin Mignon DASEN, MD   CT CERVICAL SPINE WO CONTRAST Result Date: 10/24/2023 CLINICAL DATA:  Fall with moderate to severe head and neck trauma. EXAM: CT HEAD WITHOUT CONTRAST CT CERVICAL SPINE WITHOUT CONTRAST TECHNIQUE: Multidetector CT imaging of the head and cervical spine was  performed following the standard protocol without intravenous contrast. Multiplanar CT image reconstructions of the cervical spine were also generated. RADIATION DOSE REDUCTION: This exam was performed according to the departmental dose-optimization program which includes automated exposure control, adjustment of the mA and/or kV according to patient size and/or use of iterative reconstruction technique. COMPARISON:  Head CT 07/07/2021, CT scan cervical spine 10/28/2020. FINDINGS: CT HEAD FINDINGS Brain: There are mild features of cerebral  atrophy and small-vessel disease. Cerebellum and brainstem are unremarkable. Ventricles are normal in size and position. Chronic dystrophic calcification extends along the frontal falx. No hemorrhage, cortical based acute infarct, mass or mass effect are seen. Basal cisterns are clear. Vascular: No hyperdense vessel or unexpected calcification. Skull: Negative for fractures or focal lesions. No visible scalp hematoma. Sinuses/Orbits: Mild membrane disease both maxillary sinuses. Other paranasal sinuses, bilateral mastoids, and middle ear cavities are clear. S shaped nasal septum with left-sided spurring. Other: None. CT CERVICAL SPINE FINDINGS Alignment: Within normal limits apart from bone-on-bone anterior atlantodental joint space loss with osteophytes, seen previously. Skull base and vertebrae: Normal bone mineralization. Motion artifact limits imaging through the upper 3 cervical levels. Accounting for motion, no acute cervical fracture or focal pathologic process is suspected. Soft tissues and spinal canal: No prevertebral fluid or swelling. No visible canal hematoma. Trace calcific plaque both proximal cervical ICAs. There is a heterogeneous 2.2 cm in nodule in the left lobe of the thyroid  gland, 1.2 cm hypodense nodule in the right lobe. These are not grossly changed in size. The nodules have been evaluated previously on thyroid  ultrasound 08/14/2020 with the left-sided  nodule recommended for a 1 year follow-up exam. This is the last thyroid  ultrasound in our system. If this nodule has not been restudied elsewhere, nonemergent thyroid  ultrasound follow-up is recommended to compare with the 2022 study. Visualized larynx demonstrates no mass. Portions were excluded from the field anteriorly. Disc levels: The discs are diffusely degenerated particularly from C4-5 through C7-T1 where there is complete chronic disc collapse. There are bidirectional endplate osteophytes all levels. Short pedicles exacerbate the mass effect from posterior disc osteophyte complexes, and reduce the effective AP canal diameter from C3-7. As a result, there is mild spondylotic cord compression with spinal stenosis at C3-4, spondylotic compression of the left hemicord at C4-5, with broad-based spondylotic cord compression and spinal stenosis C5-6 and C6-7. There are multilevel uncinate and facet osteophytes and acquired foraminal stenosis, greatest on the right at C4-5, with severe foraminal narrowing bilaterally at C5-6 and C6-7. Upper chest: Negative. Other: None. IMPRESSION: 1. No acute intracranial CT findings or depressed skull fractures. 2. Atrophy and small-vessel disease.  Stable exam. 3. Motion artifact limits imaging through the upper 3 cervical levels. Accounting for this, no acute cervical fractures are suspected. 4. Multilevel cervical degenerative change, spondylotic cord compression and acquired foraminal stenosis. 5. Bilateral thyroid  nodules, not grossly changed in size. Follow-up was recommended in 2022. If there has not been intervening thyroid  ultrasound elsewhere, nonemergent follow-up ultrasound is recommended. 6. Mild maxillary sinus membrane disease.  S shaped nasal septum. 7. Trace proximal cervical ICA atherosclerosis. Electronically Signed   By: Francis Quam M.D.   On: 10/24/2023 21:43   CT HEAD WO CONTRAST Result Date: 10/24/2023 CLINICAL DATA:  Fall with moderate to severe  head and neck trauma. EXAM: CT HEAD WITHOUT CONTRAST CT CERVICAL SPINE WITHOUT CONTRAST TECHNIQUE: Multidetector CT imaging of the head and cervical spine was performed following the standard protocol without intravenous contrast. Multiplanar CT image reconstructions of the cervical spine were also generated. RADIATION DOSE REDUCTION: This exam was performed according to the departmental dose-optimization program which includes automated exposure control, adjustment of the mA and/or kV according to patient size and/or use of iterative reconstruction technique. COMPARISON:  Head CT 07/07/2021, CT scan cervical spine 10/28/2020. FINDINGS: CT HEAD FINDINGS Brain: There are mild features of cerebral atrophy and small-vessel disease. Cerebellum and brainstem are unremarkable. Ventricles are normal in  size and position. Chronic dystrophic calcification extends along the frontal falx. No hemorrhage, cortical based acute infarct, mass or mass effect are seen. Basal cisterns are clear. Vascular: No hyperdense vessel or unexpected calcification. Skull: Negative for fractures or focal lesions. No visible scalp hematoma. Sinuses/Orbits: Mild membrane disease both maxillary sinuses. Other paranasal sinuses, bilateral mastoids, and middle ear cavities are clear. S shaped nasal septum with left-sided spurring. Other: None. CT CERVICAL SPINE FINDINGS Alignment: Within normal limits apart from bone-on-bone anterior atlantodental joint space loss with osteophytes, seen previously. Skull base and vertebrae: Normal bone mineralization. Motion artifact limits imaging through the upper 3 cervical levels. Accounting for motion, no acute cervical fracture or focal pathologic process is suspected. Soft tissues and spinal canal: No prevertebral fluid or swelling. No visible canal hematoma. Trace calcific plaque both proximal cervical ICAs. There is a heterogeneous 2.2 cm in nodule in the left lobe of the thyroid  gland, 1.2 cm hypodense  nodule in the right lobe. These are not grossly changed in size. The nodules have been evaluated previously on thyroid  ultrasound 08/14/2020 with the left-sided nodule recommended for a 1 year follow-up exam. This is the last thyroid  ultrasound in our system. If this nodule has not been restudied elsewhere, nonemergent thyroid  ultrasound follow-up is recommended to compare with the 2022 study. Visualized larynx demonstrates no mass. Portions were excluded from the field anteriorly. Disc levels: The discs are diffusely degenerated particularly from C4-5 through C7-T1 where there is complete chronic disc collapse. There are bidirectional endplate osteophytes all levels. Short pedicles exacerbate the mass effect from posterior disc osteophyte complexes, and reduce the effective AP canal diameter from C3-7. As a result, there is mild spondylotic cord compression with spinal stenosis at C3-4, spondylotic compression of the left hemicord at C4-5, with broad-based spondylotic cord compression and spinal stenosis C5-6 and C6-7. There are multilevel uncinate and facet osteophytes and acquired foraminal stenosis, greatest on the right at C4-5, with severe foraminal narrowing bilaterally at C5-6 and C6-7. Upper chest: Negative. Other: None. IMPRESSION: 1. No acute intracranial CT findings or depressed skull fractures. 2. Atrophy and small-vessel disease.  Stable exam. 3. Motion artifact limits imaging through the upper 3 cervical levels. Accounting for this, no acute cervical fractures are suspected. 4. Multilevel cervical degenerative change, spondylotic cord compression and acquired foraminal stenosis. 5. Bilateral thyroid  nodules, not grossly changed in size. Follow-up was recommended in 2022. If there has not been intervening thyroid  ultrasound elsewhere, nonemergent follow-up ultrasound is recommended. 6. Mild maxillary sinus membrane disease.  S shaped nasal septum. 7. Trace proximal cervical ICA atherosclerosis.  Electronically Signed   By: Francis Quam M.D.   On: 10/24/2023 21:43   DG Tibia/Fibula Left Result Date: 10/24/2023 CLINICAL DATA:  Left lower extremity pain.  Fall. EXAM: LEFT TIBIA AND FIBULA - 2 VIEW COMPARISON:  None Available. FINDINGS: There is no evidence of fracture or other focal bone lesions. No erosions or periostitis. Degenerative change of the knee. Generalized soft tissue edema. IMPRESSION: Generalized soft tissue edema. No acute osseous abnormality. Electronically Signed   By: Andrea Gasman M.D.   On: 10/24/2023 19:06   DG Shoulder Right Result Date: 10/24/2023 CLINICAL DATA:  Right shoulder pain after fall. EXAM: RIGHT SHOULDER - 2+ VIEW COMPARISON:  10/28/2020 FINDINGS: Comminuted and displaced proximal humerus fracture. There is 9 mm displacement of a component involving the surgical neck. There is displacement involving the lateral humeral head. Displaced fragment is seen medially just inferior to the glenohumeral joint. No frank  dislocation. Acromioclavicular degenerative spurring. IMPRESSION: Comminuted and displaced proximal humerus fracture. Electronically Signed   By: Andrea Gasman M.D.   On: 10/24/2023 19:05   DG Chest Port 1 View Result Date: 10/24/2023 CLINICAL DATA:  Trauma, right shoulder pain. EXAM: PORTABLE CHEST 1 VIEW COMPARISON:  Radiograph 03/04/2022, CT 07/08/2021 FINDINGS: Stable heart size and mediastinal contours. CardioMEMS on the left. Mild diffuse bronchial thickening. No pneumothorax, large pleural effusion or focal airspace disease. Remote right rib fractures. No evidence of acute rib fracture on portable exam. Right proximal humerus fracture is better assessed on concurrent shoulder exam. IMPRESSION: 1. Right proximal humeral fracture, assessed on concurrent shoulder exam. No evidence of additional acute traumatic injury. 2. Mild diffuse bronchial thickening. Electronically Signed   By: Andrea Gasman M.D.   On: 10/24/2023 19:04     Positive ROS:  All other systems have been reviewed and were otherwise negative with the exception of those mentioned in the HPI and as above.  Physical Exam: General: No acute distress Cardiovascular: No pedal edema Respiratory: No cyanosis, no use of accessory musculature GI: No organomegaly, abdomen is soft and non-tender Skin: No lesions in the area of chief complaint Neurologic: Sensation intact distally Psychiatric: Patient is at baseline mood and affect Lymphatic: No axillary or cervical lymphadenopathy  MUSCULOSKELETAL:  Fires wrist extensors as well as flexors and thumb EPL  Laceration of the left lower extremity is predominantly skin tear in nature with maceration centrally  Independent Imaging Review: 3 views right shoulder: Displaced proximal humerus fracture  Assessment: 68 year old male with a right displaced proximal humerus fracture as well as left lower extremity skin tear type laceration after a fall.  At this time I have recommended conservative management of the right shoulder.  Will plan for sling immobilization for the first month although he may also work on elbow and wrist range of motion as tolerated.  With regard to the left wound laceration this does appear to be predominantly a skin tear in nature and as a result I do not necessarily believe closure would be viable.  Given that we will plan for wound care dressing changes.  He may have a dressing change every other day with bacitracin  applied to the wound  Plan: Will plan for nonoperative close management of the right shoulder as well as wound care on the left lower extremity.  Will place a wound care consultation.  Thank you for the consult and the opportunity to see Mr. Skippy Marhefka, MD [facility] 11:00 AM

## 2023-10-25 NOTE — Evaluation (Signed)
 Physical Therapy Evaluation Patient Details Name: Charles Gray MRN: [id number] DOB: 11-11-1955 Today's Date: 10/25/2023  History of Present Illness  Pt is a 68 y.o. M presenting to [facility] on 10/24/23 after a fall at home using his walker. IMG shows comminuted and displaced R proximal humerus fx and LLE laceration. PMH: CHF, A. fib, hypertension, hyperlipidemia, OSA, fatty liver disease  Clinical Impression  Prior to admittance pt was mobilizing household distances w/ RW and utilizing WC in community when he has access. Pt reports he has an aid assist him with cooking, bathing, dressing, and transfers at baseline. Pt presents to evaluation with deficits in mobility, strength, power, balance, activity tolerance, and pain, all limiting pt's ability to mobilize near baseline. Pt was able to sit on the edge of the bed w/ heavy physical assistance of 2. Pt is requiring heavy verbal cueing for sequencing. Further mobility deferred due to significant reports of pain and fear of mobilization. Therapist provided education on the importance of mobility to reduce risk of medical complications. PT will continue to treat pt while he is admitted. Patient will benefit from continued inpatient follow up therapy, <3 hours/day.      If plan is discharge home, recommend the following: A lot of help with walking and/or transfers;A lot of help with bathing/dressing/bathroom;Assistance with cooking/housework;Assist for transportation;Help with stairs or ramp for entrance   Can travel by private vehicle   No    Equipment Recommendations Wheelchair (measurements PT);Wheelchair cushion (measurements PT);Hospital bed;Hoyer lift  Recommendations for Other Services       Functional Status Assessment Patient has had a recent decline in their functional status and demonstrates the ability to make significant improvements in function in a reasonable and predictable amount of time.     Precautions / Restrictions  Precautions Precautions: Fall Recall of Precautions/Restrictions: Intact Required Braces or Orthoses: Sling Restrictions Weight Bearing Restrictions Per Provider Order: Yes RUE Weight Bearing Per Provider Order: Non weight bearing      Mobility  Bed Mobility Overal bed mobility: Needs Assistance Bed Mobility: Supine to Sit, Sit to Supine     Supine to sit: Max assist, +2 for physical assistance, HOB elevated, Used rails Sit to supine: Max assist, +2 for physical assistance, HOB elevated, Used rails   General bed mobility comments: Pt able to initiate movement in BLE but requires physical assistance to advance LLE towards EOB and physical assistance for trunk w/ sup>sit. Bed pad was used to aid in facilitating hip movement. Pt required several resting breaks throughout. Pt then completed sit>sup w/ max A for trunk and BLE management. VC given to use LUE on bed rail. Pt then requiring total A for bringing body towards HOB.    Transfers                        Ambulation/Gait                  Stairs            Wheelchair Mobility     Tilt Bed    Modified Rankin (Stroke Patients Only)       Balance Overall balance assessment: Needs assistance Sitting-balance support: Single extremity supported, Feet unsupported Sitting balance-Leahy Scale: Poor Sitting balance - Comments: seated EOB w/ LUE stabilizing  Pertinent Vitals/Pain Pain Assessment Pain Assessment: Faces Faces Pain Scale: Hurts whole lot Pain Location: R shoulder Pain Descriptors / Indicators: Constant, Aching, Discomfort, Grimacing, Guarding, Moaning, Tender Pain Intervention(s): Limited activity within patient's tolerance, Monitored during session    Home Living Family/patient expects to be discharged to:: Private residence Living Arrangements: Alone Available Help at Discharge: Personal care attendant (3 hrs and 45 mins every  day) Type of Home: Apartment Home Access: Stairs to enter Entrance Stairs-Rails: Left Entrance Stairs-Number of Steps: 4   Home Layout: One level Home Equipment: Agricultural consultant (2 wheels);Shower seat      Prior Function Prior Level of Function : Needs assist       Physical Assist : Mobility (physical);ADLs (physical) Mobility (physical): Transfers ADLs (physical): Bathing;Dressing;IADLs Mobility Comments: mobilizes short distances w/ RW and borrows WC when out in the community. Pt reports sleeping in recliner and needs assistance w/ transfers. ADLs Comments: reports needing assistance w/ bathing, cooking, and dressing     Extremity/Trunk Assessment   Upper Extremity Assessment Upper Extremity Assessment: Generalized weakness;RUE deficits/detail;Right hand dominant RUE: Unable to fully assess due to pain;Unable to fully assess due to immobilization    Lower Extremity Assessment Lower Extremity Assessment: Generalized weakness;LLE deficits/detail LLE Deficits / Details: reduced strength as per functional assessment    Cervical / Trunk Assessment Cervical / Trunk Assessment: Kyphotic  Communication   Communication Communication: No apparent difficulties    Cognition Arousal: Alert Behavior During Therapy: WFL for tasks assessed/performed   PT - Cognitive impairments: No apparent impairments                         Following commands: Intact       Cueing Cueing Techniques: Verbal cues, Visual cues     General Comments General comments (skin integrity, edema, etc.): BLE swelling, L wound dressing soaked; RN aware    Exercises     Assessment/Plan    PT Assessment Patient needs continued PT services  PT Problem List Decreased strength;Decreased range of motion;Decreased activity tolerance;Decreased balance;Decreased mobility;Decreased coordination;Decreased knowledge of use of DME;Obesity;Pain;Decreased skin integrity       PT Treatment  Interventions DME instruction;Gait training;Functional mobility training;Therapeutic activities;Therapeutic exercise;Stair training;Balance training;Patient/family education;Wheelchair mobility training;Manual techniques;Modalities    PT Goals (Current goals can be found in the Care Plan section)  Acute Rehab PT Goals Patient Stated Goal: to get better PT Goal Formulation: With patient Time For Goal Achievement: 11/08/23 Potential to Achieve Goals: Poor    Frequency Min 2X/week     Co-evaluation               AM-PAC PT 6 Clicks Mobility  Outcome Measure Help needed turning from your back to your side while in a flat bed without using bedrails?: A Lot Help needed moving from lying on your back to sitting on the side of a flat bed without using bedrails?: A Lot Help needed moving to and from a bed to a chair (including a wheelchair)?: Total Help needed standing up from a chair using your arms (e.g., wheelchair or bedside chair)?: Total Help needed to walk in hospital room?: Total Help needed climbing 3-5 steps with a railing? : Total 6 Click Score: 8    End of Session   Activity Tolerance: Patient limited by pain Patient left: in bed;with call bell/phone within reach;with bed alarm set Nurse Communication: Mobility status;Patient requests pain meds;Other (comment) (wound dressing soaked) PT Visit Diagnosis: Muscle weakness (generalized) (M62.81);History of falling (  Z91.81);Pain;Adult, failure to thrive (R62.7) Pain - Right/Left: Right Pain - part of body: Shoulder    Time: 8852-8763 PT Time Calculation (min) (ACUTE ONLY): 49 min   Charges:   PT Evaluation $PT Eval Low Complexity: 1 Low   PT General Charges $$ ACUTE PT VISIT: 1 Visit         Leontine Hilt, SPT Acute Rehab [phone]   Leontine Hilt 10/25/2023, 1:28 PM

## 2023-10-25 NOTE — Evaluation (Addendum)
 Occupational Therapy Evaluation Patient Details Name: Charles Gray MRN: [id number] DOB: [birth year] Today's Date: 10/25/2023   History of Present Illness   Pt is a 68 y.o. M presenting to [facility] on 10/24/23 after a fall at home using his walker. IMG shows comminuted and displaced R proximal humerus fx and LLE laceration; plan for nonoperative management. PMH: CHF, A. fib, hypertension, hyperlipidemia, OSA, fatty liver disease     Clinical Impressions PTA, pt lives alone, receives daily PCA assist 4 hours per day for bathing, dressing and IADLs. Pt reports Modified Independence with toileting and household mobility using RW. Pt presents now with deficits in R dominant UE function/pain, endurance, strength and balance. Pt also with baseline L shoulder arthritis and significantly impaired ROM. Pt reports fatigue and increased pain from earlier PT eval (where +2 assist was required to sit EOB) so opted for bed level ADL eval. Educated pt on allowed movements for RUE (hand/wrist/elbow), how to use R hand to assist with tasks and sling mgmt/wear. Overall, pt requires Mod-Total A for UB ADL and Total A x 2 for LB ADLs bed level. Pt presenting significantly below baseline and will need continued inpatient follow up therapy, <3 hours/day at DC.      If plan is discharge home, recommend the following:   A lot of help with walking and/or transfers;Two people to help with walking and/or transfers;Two people to help with bathing/dressing/bathroom;A lot of help with bathing/dressing/bathroom     Functional Status Assessment   Patient has had a recent decline in their functional status and demonstrates the ability to make significant improvements in function in a reasonable and predictable amount of time.     Equipment Recommendations   Other (comment) (TBD pending progress)     Recommendations for Other Services         Precautions/Restrictions   Precautions Precautions: Fall Recall of  Precautions/Restrictions: Intact Required Braces or Orthoses: Sling Restrictions Weight Bearing Restrictions Per Provider Order: Yes RUE Weight Bearing Per Provider Order: Non weight bearing     Mobility Bed Mobility               General bed mobility comments: deferred EOB attempts due to pain and no +2 assist.    Transfers                          Balance                                           ADL either performed or assessed with clinical judgement   ADL Overall ADL's : Needs assistance/impaired Eating/Feeding: Set up;Bed level Eating/Feeding Details (indicate cue type and reason): able to hold cup with L hand and bring to mouth Grooming: Moderate assistance;Minimal assistance;Bed level;Oral care;Brushing hair;Wash/dry face;Set up Grooming Details (indicate cue type and reason): Setup to wash face, Min A for rinsing mouth out and Mod A to comb hair with assist to support LUE and assist needed to manage R side and back of hair- reports CNA assists with hair care at home due to pt difficulty reaching Upper Body Bathing: Total assistance;Bed level   Lower Body Bathing: Total assistance;+2 for safety/equipment;+2 for physical assistance;Bed level   Upper Body Dressing : Total assistance;Bed level Upper Body Dressing Details (indicate cue type and reason): Total A to place gown under sling and readjust sling wear  Lower Body Dressing: Total assistance;+2 for physical assistance;+2 for safety/equipment;Bed level       Toileting- Clothing Manipulation and Hygiene: Total assistance;+2 for physical assistance;+2 for safety/equipment;Bed level         General ADL Comments: Emphasis on the allowed movements pt can do and how these can be used to manage basic ADLs. Significant assist will be needed for bathing, dressing and toileting due to R dominant UE impairments     Vision Ability to See in Adequate Light: 0 Adequate Patient Visual Report:  No change from baseline Vision Assessment?: No apparent visual deficits     Perception         Praxis         Pertinent Vitals/Pain Pain Assessment Pain Assessment: Faces Faces Pain Scale: Hurts whole lot Pain Location: R shoulder Pain Descriptors / Indicators: Guarding, Grimacing, Sharp Pain Intervention(s): Monitored during session, Limited activity within patient's tolerance, Patient requesting pain meds-RN notified, RN gave pain meds during session     Extremity/Trunk Assessment Upper Extremity Assessment Upper Extremity Assessment: Right hand dominant;RUE deficits/detail;LUE deficits/detail RUE Deficits / Details: RUE w/ proximal humerus fx, readjusted in sling. able to make fist and move wrist but unable to bend elbow due to pain. RUE: Unable to fully assess due to pain;Unable to fully assess due to immobilization RUE Coordination: decreased gross motor LUE Deficits / Details: baseline arthritis with shoulder flexion only to around 30*. AAROM to 45* with crepitation LUE Coordination: decreased gross motor   Lower Extremity Assessment Lower Extremity Assessment: Defer to PT evaluation LLE Deficits / Details: reduced strength as per functional assessment   Cervical / Trunk Assessment Cervical / Trunk Assessment: Kyphotic   Communication Communication Communication: No apparent difficulties   Cognition Arousal: Alert Behavior During Therapy: WFL for tasks assessed/performed Cognition: No apparent impairments                               Following commands: Intact       Cueing  General Comments   Cueing Techniques: Verbal cues;Visual cues  Significant BLE edema, drainage through LLE bandage with nursing aware   Exercises     Shoulder Instructions      Home Living Family/patient expects to be discharged to:: Private residence Living Arrangements: Alone Available Help at Discharge: Personal care attendant (around 4 hours daily) Type of Home:  Apartment Home Access: Stairs to enter Entrance Stairs-Number of Steps: 4 Entrance Stairs-Rails: Left Home Layout: One level     Bathroom Shower/Tub: Chief Strategy Officer: Standard     Home Equipment: Agricultural consultant (2 wheels);Shower seat   Additional Comments: sleeps in recliner      Prior Functioning/Environment Prior Level of Function : Needs assist       Physical Assist : Mobility (physical);ADLs (physical) Mobility (physical): Transfers ADLs (physical): Bathing;Dressing;IADLs Mobility Comments: mobilizes short distances w/ RW and borrows WC when out in the community. Pt reports sleeping in recliner and needs assistance w/ transfers. ADLs Comments: reports needing assistance w/ bathing, cooking, and dressing. able to toilet self    OT Problem List: Decreased strength;Decreased activity tolerance;Impaired balance (sitting and/or standing);Decreased knowledge of precautions;Decreased knowledge of use of DME or AE;Obesity;Impaired UE functional use;Pain;Increased edema   OT Treatment/Interventions: Self-care/ADL training;Therapeutic exercise;Energy conservation;DME and/or AE instruction;Therapeutic activities;Patient/family education;Balance training      OT Goals(Current goals can be found in the care plan section)   Acute Rehab OT Goals Patient  Stated Goal: pain control, be able to use R arm again OT Goal Formulation: With patient Time For Goal Achievement: 11/08/23 Potential to Achieve Goals: Fair ADL Goals Pt Will Perform Grooming: with min assist;sitting Pt Will Perform Upper Body Bathing: with min assist;sitting Pt Will Transfer to Toilet: with mod assist;with +2 assist;stand pivot transfer;bedside commode Additional ADL Goal #1: Pt to manage R UE sling with Min A Additional ADL Goal #2: Pt to complete bed mobility with Mod A in prep for EOB/OOB ADLs   OT Frequency:  Min 2X/week    Co-evaluation              AM-PAC OT 6 Clicks Daily  Activity     Outcome Measure Help from another person eating meals?: A Little Help from another person taking care of personal grooming?: A Lot Help from another person toileting, which includes using toliet, bedpan, or urinal?: Total Help from another person bathing (including washing, rinsing, drying)?: Total Help from another person to put on and taking off regular upper body clothing?: Total Help from another person to put on and taking off regular lower body clothing?: Total 6 Click Score: 9   End of Session Nurse Communication: Mobility status  Activity Tolerance: Patient limited by pain Patient left: in bed;with call bell/phone within reach;with nursing/sitter in room  OT Visit Diagnosis: Other abnormalities of gait and mobility (R26.89);Unsteadiness on feet (R26.81);Muscle weakness (generalized) (M62.81)                Time: 8688-8653 OT Time Calculation (min): 35 min Charges:  OT General Charges $OT Visit: 1 Visit OT Evaluation $OT Eval Moderate Complexity: 1 Mod OT Treatments $Self Care/Home Management : 8-22 mins  Mliss NOVAK, OTR/L Acute Rehab Services Office: [phone]   Mliss Fish 10/25/2023, 2:13 PM

## 2023-10-25 NOTE — Progress Notes (Signed)
 Courtesy visit- No billing-  Patient is seen and examined today morning. He is admitted for displaced proximal humerus fracture. Patient with medical history of venous insufficiency, type 2 diabetes, paroxysmal A-fib on Eliquis , chronic diastolic CHF, COPD, chronic pain syndrome on Suboxone . Patient is placed on right arm splint, left leg laceration dressed.  Orthopedic surgery follow up appreciated, advised nonoperative close management of the right shoulder as well as wound care on the left lower extremity. Potassium low repletion ordered. Home meds resumed. Diet ordered. PT/ OT, further management as per clinical course.

## 2023-10-25 NOTE — H&P (Addendum)
 History and Physical  Charles Gray FMW:[id number] DOB: June 22, 1955 DOA: 10/24/2023  Referring physician: Dr. Raford, EDP  PCP: Sabas Norleen PARAS., MD  Outpatient Specialists: None Patient coming from: Home.  Chief Complaint: Fall.  HPI: Charles Gray is a 68 y.o. male with medical history significant for venous insufficiency, type 2 diabetes, paroxysmal A-fib on Eliquis , chronic diastolic CHF, COPD, chronic pain syndrome on Suboxone , who presents to the ER after a mechanical fall at home.  He was using his walker to ambulate in his house and fell onto a rug landing on his right shoulder.  In the ER, right shoulder x-ray revealed comminuted and displaced proximal humerus fracture.  He was placed on a splint.  The patient also has a laceration involving his left lower extremity which will be assessed by orthopedic surgery on 10/25/2023.  At the time of this visit, the patient is alert and oriented.  Endorses right shoulder pain.  As needed analgesics are in place.   ED Course: Temperature 98.8.  Temperature 124/63, pulse 88, respiratory 15, O2 saturation 98% room air.  Review of Systems: Review of systems as noted in the HPI. All other systems reviewed and are negative.   Past Medical History:  Diagnosis Date   Acute on chronic congestive heart failure (HCC)    Acute on chronic diastolic CHF (congestive heart failure) (HCC) 08/23/2019   Acute on chronic right-sided heart failure (HCC) 08/06/2019   Acute respiratory failure with hypoxia (HCC)    Anemia of chronic disease    Atrial fibrillation (HCC) 12/23/2019   Atrial tachycardia (HCC) 08/12/2019   Benign essential HTN    Chest pain 08/23/2019   Chronic anticoagulation 12/26/2019   Chronic heart failure with preserved EF 05/30/2019   Echo 07/2019: EF 55-60, GR 1 DD   Chronic pain syndrome    Cirrhosis of liver without ascites (HCC)    Class 2 severe obesity due to excess calories with serious comorbidity and body mass index (BMI) of 39.0  to 39.9 in adult (HCC) 05/30/2019   Debility 03/13/2019   Drug induced constipation    Dyspnea    Fatty liver disease, nonalcoholic    GIB (gastrointestinal bleeding) 03/04/2019   Heart failure, systolic, acute (HCC) 08/24/2019   History of pulmonary embolism 08/12/2019   Hyperlipidemia 01/24/2019   Hypertension 01/24/2019   Hypoalbuminemia due to protein-calorie malnutrition (HCC)    Hypokalemia    Hyponatremia    Hypotension due to drugs    Insomnia 01/24/2019   Lactic acidosis 08/23/2019   Lumbar disc herniation 01/24/2019   Mixed hyperlipidemia 01/24/2019   Morbid obesity with BMI of 40.0-44.9, adult (HCC) 05/30/2019   Normocytic anemia 06/08/2019   Occasional tremors 08/12/2019   OSA (obstructive sleep apnea) 08/12/2019   Palpitations 12/26/2019   Peripheral edema    Personal history of DVT (deep vein thrombosis) 12/26/2019   Portal hypertensive gastropathy (HCC) 03/06/2019   Seen on EGD 03/06/2019   Pulmonary emboli (HCC) 06/08/2019   Pulmonary embolism (HCC) 06/08/2019   Redness and swelling of lower leg 08/23/2019   Shock (HCC)    Thrombocytopenia (HCC)    Past Surgical History:  Procedure Laterality Date   BIOPSY  04/08/2021   Procedure: BIOPSY;  Surgeon: Aneita Gwendlyn DASEN, MD;  Location: [facility] ENDOSCOPY;  Service: Endoscopy;;   ESOPHAGOGASTRODUODENOSCOPY N/A 04/08/2021   Procedure: ESOPHAGOGASTRODUODENOSCOPY (EGD);  Surgeon: Aneita Gwendlyn DASEN, MD;  Location: [facility] ENDOSCOPY;  Service: Endoscopy;  Laterality: N/A;   ESOPHAGOGASTRODUODENOSCOPY (EGD) WITH PROPOFOL  N/A 03/05/2019   Procedure:  ESOPHAGOGASTRODUODENOSCOPY (EGD) WITH PROPOFOL ;  Surgeon: Legrand Victory LITTIE DOUGLAS, MD;  Location: [facility] ENDOSCOPY;  Service: Gastroenterology;  Laterality: N/A;  Large Blood Clot Removed   ESOPHAGOGASTRODUODENOSCOPY (EGD) WITH PROPOFOL  N/A 03/06/2019   Procedure: ESOPHAGOGASTRODUODENOSCOPY (EGD) WITH PROPOFOL ;  Surgeon: Eda Iha, MD;  Location: [facility] ENDOSCOPY;  Service: Gastroenterology;  Laterality: N/A;    HEMORROIDECTOMY     PRESSURE SENSOR/CARDIOMEMS N/A 11/14/2020   Procedure: PRESSURE SENSOR/CARDIOMEMS;  Surgeon: Rolan Ezra RAMAN, MD;  Location: [facility] INVASIVE CV LAB;  Service: Cardiovascular;  Laterality: N/A;   RIGHT HEART CATH N/A 08/09/2019   Procedure: RIGHT HEART CATH;  Surgeon: Rolan Ezra RAMAN, MD;  Location: [facility] INVASIVE CV LAB;  Service: Cardiovascular;  Laterality: N/A;   RIGHT HEART CATH N/A 11/01/2020   Procedure: RIGHT HEART CATH;  Surgeon: Rolan Ezra RAMAN, MD;  Location: [facility] INVASIVE CV LAB;  Service: Cardiovascular;  Laterality: N/A;   RIGHT HEART CATH N/A 11/14/2020   Procedure: RIGHT HEART CATH;  Surgeon: Rolan Ezra RAMAN, MD;  Location: [facility] INVASIVE CV LAB;  Service: Cardiovascular;  Laterality: N/A;   RIGHT/LEFT HEART CATH AND CORONARY ANGIOGRAPHY N/A 05/10/2020   Procedure: RIGHT/LEFT HEART CATH AND CORONARY ANGIOGRAPHY;  Surgeon: Rolan Ezra RAMAN, MD;  Location: [facility] INVASIVE CV LAB;  Service: Cardiovascular;  Laterality: N/A;    Social History:  reports that he has never smoked. He has never used smokeless tobacco. He reports that he does not currently use alcohol. He reports that he does not currently use drugs after having used the following drugs: Marijuana.   Allergies  Allergen Reactions   Penicillins Other (See Comments)    Caused convulsions as a child  Did it involve swelling of the face/tongue/throat, SOB, or low BP? N/A Did it involve sudden or severe rash/hives, skin peeling, or any reaction on the inside of your mouth or nose? N/A Did you need to seek medical attention at a hospital or doctor's office?N/A When did it last happen? N/A If all above answers are NO, may proceed with cephalosporin use.    Family History  Problem Relation Age of Onset   Hyperlipidemia Mother    Hypertension Mother    Stroke Mother    CAD Maternal Grandmother    CAD Maternal Grandfather       Prior to Admission medications   Medication Sig Start Date End Date Taking?  Authorizing Provider  apixaban  (ELIQUIS ) 5 MG TABS tablet Take 1 tablet (5 mg total) by mouth 2 (two) times daily. 11/19/21  Yes Bensimhon, Toribio SAUNDERS, MD  Buprenorphine  HCl-Naloxone  HCl 8-2 MG FILM Place 1 Film under the tongue in the morning and at bedtime.   Yes [provider]  eplerenone  (INSPRA ) 50 MG tablet TAKE TWO TABLETS BY MOUTH DAILY 10/13/23  Yes Milford, Harlene HERO, FNP  gabapentin  (NEURONTIN ) 800 MG tablet Take 1 tablet (800 mg total) by mouth 3 (three) times daily. 03/23/19  Yes Angiulli, Toribio PARAS, PA-C  lactulose , encephalopathy, (CHRONULAC ) 10 GM/15ML SOLN Take 30 mLs (20 g total) by mouth 2 (two) times daily. Patient taking differently: Take 20 g by mouth 2 (two) times daily as needed. 03/11/22  Yes Fairy Frames, MD  metolazone  (ZAROXOLYN ) 2.5 MG tablet Take 1 tablet (2.5 mg total) by mouth 3 (three) times a week. on Mondays Wednesdays and Fridays 05/28/23  Yes Rolan Ezra RAMAN, MD  metoprolol  succinate (TOPROL -XL) 25 MG 24 hr tablet TAKE ONE TABLET BY MOUTH AT BEDTIME 03/23/23  Yes McLean, Dalton S, MD  ondansetron  (ZOFRAN -ODT) 4 MG  disintegrating tablet Take 4 mg by mouth every 8 (eight) hours as needed for nausea or vomiting (dissolve orally).   Yes [provider]  pantoprazole  (PROTONIX ) 40 MG tablet Take 40 mg by mouth 2 (two) times daily before a meal.   Yes [provider]  potassium chloride  (KLOR-CON ) 20 MEQ packet Take 100 mEq by mouth 3 (three) times daily. Take an extra 40 mEq on days when you take metolazone  05/11/22  Yes Rolan Ezra RAMAN, MD  pravastatin  (PRAVACHOL ) 20 MG tablet Take 1 tablet by mouth daily. Patient taking differently: Take 20 mg by mouth at bedtime. 09/16/23  Yes Rolan Ezra RAMAN, MD  sertraline  (ZOLOFT ) 100 MG tablet Take 100 mg by mouth at bedtime. At night   Yes [provider]  tamsulosin  (FLOMAX ) 0.4 MG CAPS capsule Take 1 capsule (0.4 mg total) by mouth daily. Additional refills will need to come from PCP 04/01/22   Yes Rolan Ezra RAMAN, MD  torsemide  (DEMADEX ) 100 MG tablet TAKE ONE TABLET BY MOUTH DAILY IN THE MORNING AND EVENING 09/16/23  Yes Rolan Ezra RAMAN, MD  triamcinolone  (KENALOG ) 0.025 % cream Apply 1 Application topically 3 (three) times daily as needed (skin irritation). 08/25/23  Yes [provider]  VENTOLIN  HFA 108 (90 Base) MCG/ACT inhaler Inhale 1 puff into the lungs every 4 (four) hours as needed for shortness of breath. Patient taking differently: Inhale 2 puffs into the lungs every 4 (four) hours as needed for shortness of breath. 03/23/19  Yes Angiulli, Toribio PARAS, PA-C  zolpidem  (AMBIEN ) 10 MG tablet Take 1 tablet (10 mg total) by mouth at bedtime as needed for sleep. Patient taking differently: Take 10 mg by mouth at bedtime. 12/23/20  Yes Adhikari, Amrit, MD  polyethylene glycol (MIRALAX  / GLYCOLAX ) 17 g packet Take 17 g by mouth as needed for mild constipation (mix and drink as directed).    [provider]  bumetanide  (BUMEX ) 1 MG tablet Take 4 tablets (4 mg total) by mouth daily with breakfast AND 3 tablets (3 mg total) every evening. 05/10/20 06/13/20  Rolan Ezra RAMAN, MD  metoprolol  tartrate (LOPRESSOR ) 50 MG tablet Take 1 tablet (50 mg total) by mouth 2 (two) times daily. 02/09/20 04/11/20  Gonfa, Taye T, MD    Physical Exam: BP 124/63   Pulse 95   Temp 98.8 F (37.1 C) (Oral)   Resp 12   Ht 6' 10 (2.083 m)   Wt (!) 138.3 kg   SpO2 99%   BMI 31.89 kg/m   General: 68 y.o. year-old male well developed well nourished in no acute distress.  Alert and oriented x3. Cardiovascular: Regular rate and rhythm with no rubs or gallops.  No thyromegaly or JVD noted.  Lower extremity lymphedema. Respiratory: Clear to auscultation with no wheezes or rales. Good inspiratory effort. Abdomen: Soft nontender nondistended with normal bowel sounds x4 quadrants. Muskuloskeletal: No cyanosis or clubbing noted bilaterally Neuro: CN II-XII intact, strength, sensation, reflexes Skin:  Lymphedema, left lower extremity aspiration from the fall. Psychiatry: Judgement and insight appear normal. Mood is appropriate for condition and setting          Labs on Admission:  Basic Metabolic Panel: Recent Labs  Lab 10/24/23 1841 10/24/23 1843  NA 136 135  K 3.5 3.5  CL 95* 94*  CO2  --  29  GLUCOSE 116* 118*  BUN 24* 20  CREATININE 1.00 0.91  CALCIUM  --  8.5*   Liver Function Tests: Recent Labs  Lab  10/24/23 1843  AST 32  ALT 14  ALKPHOS 76  BILITOT 1.1  PROT 6.7  ALBUMIN  3.2*   No results for input(s): LIPASE, AMYLASE in the last 168 hours. No results for input(s): AMMONIA in the last 168 hours. CBC: Recent Labs  Lab 10/24/23 1841 10/24/23 1843 10/24/23 2210  WBC  --  5.5 9.3  HGB 9.2* 7.9* 7.3*  HCT 27.0* 28.4* 26.1*  MCV  --  69.4* 68.7*  PLT  --  129* 143*   Cardiac Enzymes: No results for input(s): CKTOTAL, CKMB, CKMBINDEX, TROPONINI in the last 168 hours.  BNP (last 3 results) Recent Labs    02/01/23 1448 05/21/23 1438  BNP 76.8 33.7    ProBNP (last 3 results) No results for input(s): PROBNP in the last 8760 hours.  CBG: No results for input(s): GLUCAP in the last 168 hours.  Radiological Exams on Admission: CT CERVICAL SPINE WO CONTRAST Result Date: 10/24/2023 CLINICAL DATA:  Fall with moderate to severe head and neck trauma. EXAM: CT HEAD WITHOUT CONTRAST CT CERVICAL SPINE WITHOUT CONTRAST TECHNIQUE: Multidetector CT imaging of the head and cervical spine was performed following the standard protocol without intravenous contrast. Multiplanar CT image reconstructions of the cervical spine were also generated. RADIATION DOSE REDUCTION: This exam was performed according to the departmental dose-optimization program which includes automated exposure control, adjustment of the mA and/or kV according to patient size and/or use of iterative reconstruction technique. COMPARISON:  Head CT 07/07/2021, CT scan cervical spine  10/28/2020. FINDINGS: CT HEAD FINDINGS Brain: There are mild features of cerebral atrophy and small-vessel disease. Cerebellum and brainstem are unremarkable. Ventricles are normal in size and position. Chronic dystrophic calcification extends along the frontal falx. No hemorrhage, cortical based acute infarct, mass or mass effect are seen. Basal cisterns are clear. Vascular: No hyperdense vessel or unexpected calcification. Skull: Negative for fractures or focal lesions. No visible scalp hematoma. Sinuses/Orbits: Mild membrane disease both maxillary sinuses. Other paranasal sinuses, bilateral mastoids, and middle ear cavities are clear. S shaped nasal septum with left-sided spurring. Other: None. CT CERVICAL SPINE FINDINGS Alignment: Within normal limits apart from bone-on-bone anterior atlantodental joint space loss with osteophytes, seen previously. Skull base and vertebrae: Normal bone mineralization. Motion artifact limits imaging through the upper 3 cervical levels. Accounting for motion, no acute cervical fracture or focal pathologic process is suspected. Soft tissues and spinal canal: No prevertebral fluid or swelling. No visible canal hematoma. Trace calcific plaque both proximal cervical ICAs. There is a heterogeneous 2.2 cm in nodule in the left lobe of the thyroid  gland, 1.2 cm hypodense nodule in the right lobe. These are not grossly changed in size. The nodules have been evaluated previously on thyroid  ultrasound 08/14/2020 with the left-sided nodule recommended for a 1 year follow-up exam. This is the last thyroid  ultrasound in our system. If this nodule has not been restudied elsewhere, nonemergent thyroid  ultrasound follow-up is recommended to compare with the 2022 study. Visualized larynx demonstrates no mass. Portions were excluded from the field anteriorly. Disc levels: The discs are diffusely degenerated particularly from C4-5 through C7-T1 where there is complete chronic disc collapse. There  are bidirectional endplate osteophytes all levels. Short pedicles exacerbate the mass effect from posterior disc osteophyte complexes, and reduce the effective AP canal diameter from C3-7. As a result, there is mild spondylotic cord compression with spinal stenosis at C3-4, spondylotic compression of the left hemicord at C4-5, with broad-based spondylotic cord compression and spinal stenosis C5-6 and C6-7. There  are multilevel uncinate and facet osteophytes and acquired foraminal stenosis, greatest on the right at C4-5, with severe foraminal narrowing bilaterally at C5-6 and C6-7. Upper chest: Negative. Other: None. IMPRESSION: 1. No acute intracranial CT findings or depressed skull fractures. 2. Atrophy and small-vessel disease.  Stable exam. 3. Motion artifact limits imaging through the upper 3 cervical levels. Accounting for this, no acute cervical fractures are suspected. 4. Multilevel cervical degenerative change, spondylotic cord compression and acquired foraminal stenosis. 5. Bilateral thyroid  nodules, not grossly changed in size. Follow-up was recommended in 2022. If there has not been intervening thyroid  ultrasound elsewhere, nonemergent follow-up ultrasound is recommended. 6. Mild maxillary sinus membrane disease.  S shaped nasal septum. 7. Trace proximal cervical ICA atherosclerosis. Electronically Signed   By: Francis Quam M.D.   On: 10/24/2023 21:43   CT HEAD WO CONTRAST Result Date: 10/24/2023 CLINICAL DATA:  Fall with moderate to severe head and neck trauma. EXAM: CT HEAD WITHOUT CONTRAST CT CERVICAL SPINE WITHOUT CONTRAST TECHNIQUE: Multidetector CT imaging of the head and cervical spine was performed following the standard protocol without intravenous contrast. Multiplanar CT image reconstructions of the cervical spine were also generated. RADIATION DOSE REDUCTION: This exam was performed according to the departmental dose-optimization program which includes automated exposure control,  adjustment of the mA and/or kV according to patient size and/or use of iterative reconstruction technique. COMPARISON:  Head CT 07/07/2021, CT scan cervical spine 10/28/2020. FINDINGS: CT HEAD FINDINGS Brain: There are mild features of cerebral atrophy and small-vessel disease. Cerebellum and brainstem are unremarkable. Ventricles are normal in size and position. Chronic dystrophic calcification extends along the frontal falx. No hemorrhage, cortical based acute infarct, mass or mass effect are seen. Basal cisterns are clear. Vascular: No hyperdense vessel or unexpected calcification. Skull: Negative for fractures or focal lesions. No visible scalp hematoma. Sinuses/Orbits: Mild membrane disease both maxillary sinuses. Other paranasal sinuses, bilateral mastoids, and middle ear cavities are clear. S shaped nasal septum with left-sided spurring. Other: None. CT CERVICAL SPINE FINDINGS Alignment: Within normal limits apart from bone-on-bone anterior atlantodental joint space loss with osteophytes, seen previously. Skull base and vertebrae: Normal bone mineralization. Motion artifact limits imaging through the upper 3 cervical levels. Accounting for motion, no acute cervical fracture or focal pathologic process is suspected. Soft tissues and spinal canal: No prevertebral fluid or swelling. No visible canal hematoma. Trace calcific plaque both proximal cervical ICAs. There is a heterogeneous 2.2 cm in nodule in the left lobe of the thyroid  gland, 1.2 cm hypodense nodule in the right lobe. These are not grossly changed in size. The nodules have been evaluated previously on thyroid  ultrasound 08/14/2020 with the left-sided nodule recommended for a 1 year follow-up exam. This is the last thyroid  ultrasound in our system. If this nodule has not been restudied elsewhere, nonemergent thyroid  ultrasound follow-up is recommended to compare with the 2022 study. Visualized larynx demonstrates no mass. Portions were excluded from  the field anteriorly. Disc levels: The discs are diffusely degenerated particularly from C4-5 through C7-T1 where there is complete chronic disc collapse. There are bidirectional endplate osteophytes all levels. Short pedicles exacerbate the mass effect from posterior disc osteophyte complexes, and reduce the effective AP canal diameter from C3-7. As a result, there is mild spondylotic cord compression with spinal stenosis at C3-4, spondylotic compression of the left hemicord at C4-5, with broad-based spondylotic cord compression and spinal stenosis C5-6 and C6-7. There are multilevel uncinate and facet osteophytes and acquired foraminal stenosis, greatest on the  right at C4-5, with severe foraminal narrowing bilaterally at C5-6 and C6-7. Upper chest: Negative. Other: None. IMPRESSION: 1. No acute intracranial CT findings or depressed skull fractures. 2. Atrophy and small-vessel disease.  Stable exam. 3. Motion artifact limits imaging through the upper 3 cervical levels. Accounting for this, no acute cervical fractures are suspected. 4. Multilevel cervical degenerative change, spondylotic cord compression and acquired foraminal stenosis. 5. Bilateral thyroid  nodules, not grossly changed in size. Follow-up was recommended in 2022. If there has not been intervening thyroid  ultrasound elsewhere, nonemergent follow-up ultrasound is recommended. 6. Mild maxillary sinus membrane disease.  S shaped nasal septum. 7. Trace proximal cervical ICA atherosclerosis. Electronically Signed   By: Francis Quam M.D.   On: 10/24/2023 21:43   DG Tibia/Fibula Left Result Date: 10/24/2023 CLINICAL DATA:  Left lower extremity pain.  Fall. EXAM: LEFT TIBIA AND FIBULA - 2 VIEW COMPARISON:  None Available. FINDINGS: There is no evidence of fracture or other focal bone lesions. No erosions or periostitis. Degenerative change of the knee. Generalized soft tissue edema. IMPRESSION: Generalized soft tissue edema. No acute osseous  abnormality. Electronically Signed   By: Andrea Gasman M.D.   On: 10/24/2023 19:06   DG Shoulder Right Result Date: 10/24/2023 CLINICAL DATA:  Right shoulder pain after fall. EXAM: RIGHT SHOULDER - 2+ VIEW COMPARISON:  10/28/2020 FINDINGS: Comminuted and displaced proximal humerus fracture. There is 9 mm displacement of a component involving the surgical neck. There is displacement involving the lateral humeral head. Displaced fragment is seen medially just inferior to the glenohumeral joint. No frank dislocation. Acromioclavicular degenerative spurring. IMPRESSION: Comminuted and displaced proximal humerus fracture. Electronically Signed   By: Andrea Gasman M.D.   On: 10/24/2023 19:05   DG Chest Port 1 View Result Date: 10/24/2023 CLINICAL DATA:  Trauma, right shoulder pain. EXAM: PORTABLE CHEST 1 VIEW COMPARISON:  Radiograph 03/04/2022, CT 07/08/2021 FINDINGS: Stable heart size and mediastinal contours. CardioMEMS on the left. Mild diffuse bronchial thickening. No pneumothorax, large pleural effusion or focal airspace disease. Remote right rib fractures. No evidence of acute rib fracture on portable exam. Right proximal humerus fracture is better assessed on concurrent shoulder exam. IMPRESSION: 1. Right proximal humeral fracture, assessed on concurrent shoulder exam. No evidence of additional acute traumatic injury. 2. Mild diffuse bronchial thickening. Electronically Signed   By: Andrea Gasman M.D.   On: 10/24/2023 19:04    EKG: I independently viewed the EKG done and my findings are as followed: Sinus rhythm rate of 91.  Nonspecific ST changes.  QTc 492.  Assessment/Plan Present on Admission:  Fall  Principal Problem:   Fall  Mechanical fall Comminuted and displaced proximal right humeral fracture As needed analgesics Fall precautions PT OT assessment with the guidance of orthopedic surgery  Left lower extremity laceration, POA Orthopedic surgery will evaluate the laceration in  10/25/2023 Received 1 dose of IV vancomycin  in the ER. As needed analgesics.  Acute blood loss anemia in the setting of right proximal humeral fracture Hemoglobin 7.3 and MCV of 68 repeat CBC Continue to closely monitor H&H, transfuse as indicated.  Thrombocytopenia Repeat CBC and monitor for now  Paroxysmal A-fib on Eliquis  Resume home Eliquis  when okay with orthopedic surgery. Monitor on telemetry.  Obesity BMI 31 Recommend weight loss outpatient.  Type 2 diabetes Well-controlled blood sugar Monitor   Chronic pain syndrome Resume home Suboxone   Chronic diastolic CHF Resume home regimen  Chronic anxiety/depression Resume home Zoloft    Time: 75 minutes.   DVT prophylaxis: SCDs.  Code Status: Full code.  Family Communication: None at bedside.  Disposition Plan: Admitted to the telemetry surgical unit.  Consults called: Orthopedic surgery.  Admission status: Inpatient status.   Status is: Inpatient The patient requires at least a midnights for further evaluation and treatment of present condition.   Terry LOISE Hurst MD [facility] Pager [phone]  If 7PM-7AM, please contact night-coverage [email or web address] Password [facility]  10/25/2023, 4:55 AM

## 2023-10-25 NOTE — TOC CAGE-AID Note (Signed)
 Transition of Care [facility]) - CAGE-AID Screening   Patient Details  Name: Charles Gray MRN: [id number] Date of Birth: [birth year]  Transition of Care [facility]) CM/SW Contact:    Maliik Karner E Kamila Broda, LCSW Phone Number: 10/25/2023, 11:01 AM   Clinical Narrative: No current SA noted.   CAGE-AID Screening:    Have You Ever Felt You Ought to Cut Down on Your Drinking or Drug Use?: No Have People Annoyed You By Critizing Your Drinking Or Drug Use?: No Have You Felt Bad Or Guilty About Your Drinking Or Drug Use?: No Have You Ever Had a Drink or Used Drugs First Thing In The Morning to Steady Your Nerves or to Get Rid of a Hangover?: No CAGE-AID Score: 0  Substance Abuse Education Offered: No

## 2023-10-25 NOTE — Progress Notes (Signed)
   10/25/23 0600  Spiritual Encounters  Type of Visit Initial  Care provided to: Patient  Conversation partners present during encounter Nurse  Referral source Patient request  Reason for visit Urgent spiritual support  OnCall Visit Yes    Chaplain was paged for urgent spiritual support. Patient stated that he fell while he was feeding his cat and injured his right shoulder. He was in severe pain. His left wound was still bleeding.  Pt expressed that he doesn't know how to pray to God. Chaplain prayed upon patient's request. He also shared his life and relationship with sons. He said that living alone is difficult. Chaplain remained for a while with him, asked open ended questions and provided spiritual and emotional support.   M.Kubra Susanna Kerry Resident [phone]

## 2023-10-26 DIAGNOSIS — I5032 Chronic diastolic (congestive) heart failure: Secondary | ICD-10-CM

## 2023-10-26 DIAGNOSIS — I480 Paroxysmal atrial fibrillation: Secondary | ICD-10-CM

## 2023-10-26 DIAGNOSIS — S81811A Laceration without foreign body, right lower leg, initial encounter: Secondary | ICD-10-CM | POA: Diagnosis not present

## 2023-10-26 DIAGNOSIS — S42291A Other displaced fracture of upper end of right humerus, initial encounter for closed fracture: Secondary | ICD-10-CM | POA: Diagnosis not present

## 2023-10-26 DIAGNOSIS — E876 Hypokalemia: Secondary | ICD-10-CM

## 2023-10-26 DIAGNOSIS — D62 Acute posthemorrhagic anemia: Secondary | ICD-10-CM

## 2023-10-26 DIAGNOSIS — W19XXXA Unspecified fall, initial encounter: Secondary | ICD-10-CM | POA: Diagnosis not present

## 2023-10-26 LAB — IRON AND TIBC
Iron: 15 ug/dL — ABNORMAL LOW (ref 45–182)
Saturation Ratios: 3 % — ABNORMAL LOW (ref 17.9–39.5)
TIBC: 466 ug/dL — ABNORMAL HIGH (ref 250–450)
UIBC: 451 ug/dL

## 2023-10-26 LAB — CBC
HCT: 23.4 % — ABNORMAL LOW (ref 39.0–52.0)
Hemoglobin: 6.6 g/dL — CL (ref 13.0–17.0)
MCH: 19.5 pg — ABNORMAL LOW (ref 26.0–34.0)
MCHC: 28.2 g/dL — ABNORMAL LOW (ref 30.0–36.0)
MCV: 69.2 fL — ABNORMAL LOW (ref 80.0–100.0)
Platelets: 119 K/uL — ABNORMAL LOW (ref 150–400)
RBC: 3.38 MIL/uL — ABNORMAL LOW (ref 4.22–5.81)
RDW: 17.6 % — ABNORMAL HIGH (ref 11.5–15.5)
WBC: 8.1 K/uL (ref 4.0–10.5)
nRBC: 0 % (ref 0.0–0.2)

## 2023-10-26 LAB — BASIC METABOLIC PANEL WITH GFR
Anion gap: 12 (ref 5–15)
BUN: 15 mg/dL (ref 8–23)
CO2: 30 mmol/L (ref 22–32)
Calcium: 8.8 mg/dL — ABNORMAL LOW (ref 8.9–10.3)
Chloride: 95 mmol/L — ABNORMAL LOW (ref 98–111)
Creatinine, Ser: 0.97 mg/dL (ref 0.61–1.24)
GFR, Estimated: >60 mL/min (ref >60)
Glucose, Bld: 115 mg/dL — ABNORMAL HIGH (ref 70–99)
Potassium: 3.1 mmol/L — ABNORMAL LOW (ref 3.5–5.1)
Sodium: 137 mmol/L (ref 135–145)

## 2023-10-26 LAB — PREPARE RBC (CROSSMATCH)

## 2023-10-26 LAB — HEMOGLOBIN AND HEMATOCRIT, BLOOD
HCT: 24.1 % — ABNORMAL LOW (ref 39.0–52.0)
Hemoglobin: 7 g/dL — ABNORMAL LOW (ref 13.0–17.0)

## 2023-10-26 LAB — MAGNESIUM: Magnesium: 1.7 mg/dL (ref 1.7–2.4)

## 2023-10-26 LAB — FERRITIN: Ferritin: 17 ng/mL — ABNORMAL LOW (ref 24–336)

## 2023-10-26 LAB — FOLATE: Folate: 24 ng/mL (ref >5.9)

## 2023-10-26 LAB — VITAMIN B12: Vitamin B-12: 215 pg/mL (ref 180–914)

## 2023-10-26 MED ORDER — POTASSIUM CHLORIDE CRYS ER 20 MEQ PO TBCR
40.0000 meq | EXTENDED_RELEASE_TABLET | Freq: Three times a day (TID) | ORAL | Status: DC
  Administered 2023-10-26 (×3): 40 meq via ORAL
  Filled 2023-10-26 (×3): qty 2

## 2023-10-26 MED ORDER — SODIUM CHLORIDE 0.9% IV SOLUTION: Freq: Once | INTRAVENOUS | Status: AC

## 2023-10-26 NOTE — TOC Initial Note (Addendum)
 Transition of Care [facility]) - Initial/Assessment Note    Patient Details  Name: Charles Gray MRN: [id number] Date of Birth: 01/11/1956  Transition [facility]) CM/SW Contact:    Lendia Dais, LCSW Phone Number: 10/26/2023, 11:51 AM  Clinical Narrative: CSW spoke to pt about PT recs for SNF. Pt is agreeable to SNF. Permission was given to send out referrals in the hub. Pt was given medicare form.   Pt is from home alone and has a home health aide four hours a day five days out the week. Pt has given verbal permission to contact his son [name].   Referral has been sent out in the hub for SNF.                  Expected Discharge Plan: Skilled Nursing Facility Barriers to Discharge: SNF Pending bed offer, Continued Medical Work up   Patient Goals and CMS Choice Patient states their goals for this hospitalization and ongoing recovery are:: Heal up CMS Medicare.gov Compare Post Acute Care list provided to:: Patient Choice offered to / list presented to : Patient      Expected Discharge Plan and Services In-house Referral: Clinical Social Work   Post Acute Care Choice: Skilled Nursing Facility Living arrangements for the past 2 months: Single Family Home                                      Prior Living Arrangements/Services Living arrangements for the past 2 months: Single Family Home Lives with:: Self Patient language and need for interpreter reviewed:: Yes Do you feel safe going back to the place where you live?: Yes      Need for Family Participation in Patient Care: Yes (Comment) Care giver support system in place?: Yes (comment) Current home services: Homehealth aide (Four hours a day and five days a week) Criminal Activity/Legal Involvement Pertinent to Current Situation/Hospitalization: No - Comment as needed  Activities of Daily Living      Permission Sought/Granted Permission sought to share information with : Family Supports Permission granted to  share information with : Yes, Verbal Permission Granted  Share Information with NAME: [name] (son)  Permission granted to share info w AGENCY: SNF  Permission granted to share info w Relationship: Son     Emotional Assessment Appearance:: Appears older than stated age Attitude/Demeanor/Rapport: Engaged Affect (typically observed): Pleasant, Appropriate Orientation: : Oriented to Self, Oriented to Place, Oriented to  Time, Oriented to Situation Alcohol / Substance Use: Never Used Psych Involvement: No (comment)  Admission diagnosis:  Fall [W19.XXXA] Leg laceration, right, initial encounter [S81.811A] Symptomatic anemia [D64.9] Closed 3-part fracture of proximal end of right humerus, initial encounter [S42.291A] Patient Active Problem List   Diagnosis Date Noted   Fall 10/25/2023   Closed 3-part fracture of proximal end of right humerus 10/25/2023   Leg laceration, right, initial encounter 10/25/2023   Acute on chronic heart failure with preserved ejection fraction (HFpEF) (HCC) 03/04/2022   Dyslipidemia 07/17/2021   Lymphedema 07/08/2021   Melena    Gastric polyps    UGIB (upper gastrointestinal bleed) 04/06/2021   Acute encephalopathy 12/21/2020   COPD (chronic obstructive pulmonary disease) (HCC) 12/21/2020   Chronic diastolic (congestive) heart failure (HCC) 11/14/2020   Syncope 10/28/2020   CHF (congestive heart failure) (HCC) 10/28/2020   Chronic venous stasis dermatitis of both lower extremities 08/27/2020   Cellulitis of both lower extremities 08/14/2020  Iron  deficiency anemia 08/14/2020   GERD without esophagitis 06/10/2020   Atrial fibrillation with rapid ventricular response (HCC) 03/24/2020   Acute on chronic right heart failure (HCC) 01/27/2020   Acute respiratory failure with hypoxia (HCC)    Fatty liver disease, nonalcoholic    Shock (HCC)    Personal history of DVT (deep vein thrombosis) 12/26/2019   Chronic anticoagulation 12/26/2019   Palpitations  12/26/2019   Paroxysmal atrial fibrillation (HCC) 12/23/2019   Heart failure, systolic, acute (HCC) 08/24/2019   Acute on chronic congestive heart failure (HCC)    Acute on chronic diastolic CHF (congestive heart failure) (HCC) 08/23/2019   Chest pain 08/23/2019   Lactic acidosis 08/23/2019   History of pulmonary embolism 08/12/2019   Atrial tachycardia (HCC) 08/12/2019   Occasional tremors 08/12/2019   OSA (obstructive sleep apnea) 08/12/2019   AKI (acute kidney injury) (HCC) 08/08/2019   Acute on chronic right-sided heart failure (HCC) 08/06/2019   Acute pulmonary embolism (HCC) 06/08/2019   Normocytic anemia 06/08/2019   Pulmonary emboli (HCC) 06/08/2019   Chronic heart failure with preserved ejection fraction (HCC) 05/30/2019   Class 2 severe obesity due to excess calories with serious comorbidity and body mass index (BMI) of 39.0 to 39.9 in adult (HCC) 05/30/2019   Morbid obesity with BMI of 40.0-44.9, adult (HCC) 05/30/2019   Hyponatremia    Drug induced constipation    Peripheral edema    Hypotension due to drugs    Hypoalbuminemia due to protein-calorie malnutrition (HCC)    Thrombocytopenia (HCC)    Anemia of chronic disease    Cirrhosis of liver without ascites (HCC)    Chronic pain syndrome    Debility 03/13/2019   Portal hypertensive gastropathy (HCC) 03/06/2019   Dyspnea    GIB (gastrointestinal bleeding) 03/04/2019   Mixed hyperlipidemia 01/24/2019   Insomnia 01/24/2019   Hyperlipidemia 01/24/2019   Essential hypertension 01/24/2019   Lumbar disc herniation 01/24/2019   PCP:  Sabas Norleen PARAS., MD Pharmacy:   [facility] [place] [place] - [address] [place] [address] Phone: [phone] Fax: [phone]     Social Drivers of Health (SDOH) Social History: SDOH Screenings   Food Insecurity: No Food Insecurity (03/08/2022)  Housing: Low Risk  (03/08/2022)  Transportation Needs: Unmet Transportation Needs (04/21/2022)   Utilities: Not At Risk (03/08/2022)  Alcohol Screen: Low Risk  (06/11/2020)  Depression (PHQ2-9): Medium Risk (06/11/2020)  Financial Resource Strain: Medium Risk (11/15/2020)  Tobacco Use: Low Risk  (10/24/2023)   SDOH Interventions:     Readmission Risk Interventions     No data to display

## 2023-10-26 NOTE — Plan of Care (Signed)
  Problem: Pain Managment: Goal: General experience of comfort will improve and/or be controlled Outcome: Progressing   Problem: Safety: Goal: Ability to remain free from injury will improve Outcome: Progressing   Problem: Skin Integrity: Goal: Risk for impaired skin integrity will decrease Outcome: Progressing

## 2023-10-26 NOTE — Progress Notes (Signed)
   10/26/23 1600  Spiritual Encounters  Type of Visit Follow up  Care provided to: Patient  Conversation partners present during encounter Nurse  Referral source Chaplain assessment  Reason for visit Routine spiritual support  OnCall Visit No   Chaplain made follow up visit. The patient shared that he is still in pain, the pain medicine doesn't work that much. He hardly moves his left arm. He shared that his son is on vacation, and he couldn't reach out to him. He most highly will be here coming Saturday.   Chaplain listened to him reflectively, asked open ended questions and provided emotional and spiritual support. Chaplain brought some water and prayed together with him upon his request. Chaplain will be available if needed.   M.Kubra Susanna Kerry Resident [phone]

## 2023-10-26 NOTE — NC FL2 (Addendum)
 [place]  MEDICAID FL2 LEVEL OF CARE FORM     IDENTIFICATION  Patient Name: Charles Gray Birthdate: 16-Dec-1955 Sex: male Admission Date (Current Location): 10/24/2023  [facility] and [place] Number:  Producer, television/film/video and Address:  [facility], [address] [place], [place] [address]      Provider Number: [id number]  Attending Physician Name and Address:  Darci Pore, MD  Relative Name and Phone Number:  Timothee, Gali [phone])  [phone]    Current Level of Care: Hospital Recommended Level of Care: Skilled Nursing Facility Prior Approval Number:    Date Approved/Denied:   PASRR Number: [id number]  Discharge Plan: SNF    Current Diagnoses: Patient Active Problem List   Diagnosis Date Noted   Fall 10/25/2023   Closed 3-part fracture of proximal end of right humerus 10/25/2023   Leg laceration, right, initial encounter 10/25/2023   Acute on chronic heart failure with preserved ejection fraction (HFpEF) (HCC) 03/04/2022   Dyslipidemia 07/17/2021   Lymphedema 07/08/2021   Melena    Gastric polyps    UGIB (upper gastrointestinal bleed) 04/06/2021   Acute encephalopathy 12/21/2020   COPD (chronic obstructive pulmonary disease) (HCC) 12/21/2020   Chronic diastolic (congestive) heart failure (HCC) 11/14/2020   Syncope 10/28/2020   CHF (congestive heart failure) (HCC) 10/28/2020   Chronic venous stasis dermatitis of both lower extremities 08/27/2020   Cellulitis of both lower extremities 08/14/2020   Iron  deficiency anemia 08/14/2020   GERD without esophagitis 06/10/2020   Atrial fibrillation with rapid ventricular response (HCC) 03/24/2020   Acute on chronic right heart failure (HCC) 01/27/2020   Acute respiratory failure with hypoxia (HCC)    Fatty liver disease, nonalcoholic    Shock (HCC)    Personal history of DVT (deep vein thrombosis) 12/26/2019   Chronic anticoagulation 12/26/2019   Palpitations 12/26/2019    Paroxysmal atrial fibrillation (HCC) 12/23/2019   Heart failure, systolic, acute (HCC) 08/24/2019   Acute on chronic congestive heart failure (HCC)    Acute on chronic diastolic CHF (congestive heart failure) (HCC) 08/23/2019   Chest pain 08/23/2019   Lactic acidosis 08/23/2019   History of pulmonary embolism 08/12/2019   Atrial tachycardia (HCC) 08/12/2019   Occasional tremors 08/12/2019   OSA (obstructive sleep apnea) 08/12/2019   AKI (acute kidney injury) (HCC) 08/08/2019   Acute on chronic right-sided heart failure (HCC) 08/06/2019   Acute pulmonary embolism (HCC) 06/08/2019   Normocytic anemia 06/08/2019   Pulmonary emboli (HCC) 06/08/2019   Chronic heart failure with preserved ejection fraction (HCC) 05/30/2019   Class 2 severe obesity due to excess calories with serious comorbidity and body mass index (BMI) of 39.0 to 39.9 in adult (HCC) 05/30/2019   Morbid obesity with BMI of 40.0-44.9, adult (HCC) 05/30/2019   Hyponatremia    Drug induced constipation    Peripheral edema    Hypotension due to drugs    Hypoalbuminemia due to protein-calorie malnutrition (HCC)    Thrombocytopenia (HCC)    Anemia of chronic disease    Cirrhosis of liver without ascites (HCC)    Chronic pain syndrome    Debility 03/13/2019   Portal hypertensive gastropathy (HCC) 03/06/2019   Dyspnea    GIB (gastrointestinal bleeding) 03/04/2019   Mixed hyperlipidemia 01/24/2019   Insomnia 01/24/2019   Hyperlipidemia 01/24/2019   Essential hypertension 01/24/2019   Lumbar disc herniation 01/24/2019    Orientation RESPIRATION BLADDER Height & Weight     Self, Time, Situation, Place  Normal   Weight: (!) 305  lb (138.3 kg) Height:  6' 10 (208.3 cm)  BEHAVIORAL SYMPTOMS/MOOD NEUROLOGICAL BOWEL NUTRITION STATUS        Diet (see dc summary)  AMBULATORY STATUS COMMUNICATION OF NEEDS Skin   Total Care Verbally Other (Comment) (blister and weeping)                       Personal Care Assistance  Level of Assistance  Bathing, Feeding, Dressing, Total care Bathing Assistance: Maximum assistance Feeding assistance: Limited assistance Dressing Assistance: Maximum assistance Total Care Assistance: Maximum assistance   Functional Limitations Info  Sight, Hearing, Speech Sight Info: Adequate Hearing Info: Adequate Speech Info: Adequate    SPECIAL CARE FACTORS FREQUENCY  PT (By licensed PT), OT (By licensed OT)     PT Frequency: 5x a week OT Frequency: 5x a week            Contractures Contractures Info: Not present    Additional Factors Info  Code Status, Allergies Code Status Info: Full Allergies Info: Penicillins           Current Medications (10/26/2023):  This is the current hospital active medication list Current Facility-Administered Medications  Medication Dose Route Frequency Provider Last Rate Last Admin   0.9 %  sodium chloride  infusion (Manually program via Guardrails IV Fluids)   Intravenous Once Darci Pore, MD       albuterol  (PROVENTIL ) (2.5 MG/3ML) 0.083% nebulizer solution 3 mL  3 mL Inhalation Q4H PRN Darci Pore, MD       apixaban  (ELIQUIS ) tablet 5 mg  5 mg Oral BID Sreeram, Narendranath, MD   5 mg at 10/26/23 1030   bacitracin  ointment   Topical Daily Sreeram, Pore, MD   31.5 Application at 10/26/23 1026   buprenorphine -naloxone  (SUBOXONE ) 8-2 mg per SL tablet 1 tablet  1 tablet Sublingual Daily Hall, Carole N, DO       fentaNYL  (SUBLIMAZE ) injection 100 mcg  100 mcg Intravenous Q1H PRN Raford Lenis, MD   100 mcg at 10/26/23 1035   gabapentin  (NEURONTIN ) capsule 800 mg  800 mg Oral TID Sreeram, Narendranath, MD   800 mg at 10/26/23 1031   lactulose  (CHRONULAC ) 10 GM/15ML solution 20 g  20 g Oral BID Sreeram, Narendranath, MD   20 g at 10/25/23 1316   metolazone  (ZAROXOLYN ) tablet 2.5 mg  2.5 mg Oral Once per day on Monday Wednesday Friday Darci Pore, MD       metoprolol  succinate (TOPROL -XL) 24 hr tablet 25 mg   25 mg Oral QHS Sreeram, Narendranath, MD   25 mg at 10/25/23 2108   pantoprazole  (PROTONIX ) EC tablet 40 mg  40 mg Oral BID AC Sreeram, Pore, MD   40 mg at 10/26/23 1030   potassium chloride  SA (KLOR-CON  M) CR tablet 40 mEq  40 mEq Oral TID Sreeram, Narendranath, MD   40 mEq at 10/26/23 1031   pravastatin  (PRAVACHOL ) tablet 20 mg  20 mg Oral QHS Sreeram, Narendranath, MD   20 mg at 10/25/23 2108   sertraline  (ZOLOFT ) tablet 100 mg  100 mg Oral QHS Hall, Carole N, DO   100 mg at 10/25/23 2108   spironolactone  (ALDACTONE ) tablet 25 mg  25 mg Oral Daily Sreeram, Narendranath, MD   25 mg at 10/26/23 1030   tamsulosin  (FLOMAX ) capsule 0.4 mg  0.4 mg Oral Daily Hall, Carole N, DO   0.4 mg at 10/26/23 1030   torsemide  (DEMADEX ) tablet 100 mg  100 mg Oral BID Darci Pore, MD  100 mg at 10/26/23 1030     Discharge Medications: Please see discharge summary for a list of discharge medications.  Relevant Imaging Results:  Relevant Lab Results:   Additional Information SSN [id number]  [place], LCSW

## 2023-10-26 NOTE — Progress Notes (Signed)
 Progress Note   Patient: Charles Gray FMW:[id number] DOB: 04/25/1955 DOA: 10/24/2023     1 DOS: the patient was seen and examined on 10/26/2023   Brief hospital course: Seif Teichert is a 68 y.o. male with medical history significant for venous insufficiency, type 2 diabetes, paroxysmal A-fib on Eliquis , chronic diastolic CHF, COPD, chronic pain syndrome on Suboxone , who presents to the ER after a mechanical fall at home.   Patient sustained comminuted displaced proximal right humerus fracture, was placed on a splint in ED.  He also had laceration of her left lower extremity, admitted to [facility] service for further management with orthopedic surgery consultation.  Assessment and Plan: Status post mechanical fall Comminuted and displaced proximal right humerus fracture Left lower extremity laceration injury- Orthopedic surgery consultation appreciated. Nonoperative conservative management recommended for right humerus fracture. Continue pain control, splint in place.  Continue wound care for left leg laceration. PT OT evaluation advised to SNF.  Acute blood loss anemia- Patient's hemoglobin 6.6 today.  He is on Eliquis . 1 unit of packed RBC transfusion ordered. Continue to closely monitor H&H and transfuse for hemoglobin less than 7.  Chronic systolic CHF Patient is euvolemic.  Continue home dose torsemide , metolazone , Toprol -XL, Aldactone .  Paroxysmal A-fib Patient is currently on beta-blocker, Eliquis  therapy. Continue telemetry.  Hypokalemia- Due to diuresis. Oral potassium repletion scheduled.  Obesity with BMI 31.89 Diet, exercise and weight reduction advised.  Type 2 diabetes mellitus Diet controlled.  Check A1c. Blood sugar stable.      Out of bed to chair. Incentive spirometry. Nursing supportive care. Fall, aspiration precautions. Diet:  Diet Orders (From admission, onward)     Start     Ordered   10/25/23 1100  Diet regular Room service appropriate? Yes;  Fluid consistency: Thin  Diet effective now       Question Answer Comment  Room service appropriate? Yes   Fluid consistency: Thin      10/25/23 1059           DVT prophylaxis:  apixaban  (ELIQUIS ) tablet 5 mg  Level of care: Telemetry Surgical   Code Status: Full Code  Subjective: Patient is seen and examined today morning.  He is complaining of pain over right shoulder, left knee.  Did not get out of bed.  Advised incentive spirometry, PT.  Physical Exam: Vitals:   10/26/23 0745 10/26/23 1230 10/26/23 1318 10/26/23 1547  BP: (!) 106/55 (!) 109/59 131/71 116/66  Pulse: 75 84 89 88  Resp: 18 18 18 19   Temp: 98.4 F (36.9 C) 98.7 F (37.1 C) 98.8 F (37.1 C) 99.3 F (37.4 C)  TempSrc: Oral Oral Oral Oral  SpO2: 100% 96% 98% 99%  Weight:      Height:        General - Elderly obese Caucasian male, distress due to pain HEENT - PERRLA, EOMI, atraumatic head, non tender sinuses. Lung - Clear, basal rales, rhonchi, no wheezes. Heart - S1, S2 heard, no murmurs, rubs, 3+ pitting pedal edema. Abdomen - Soft, non tender, obese, bowel sounds good Neuro - Alert, awake and oriented x 3, non focal exam. Skin - Warm and dry.  Chronic lower extremity skin changes, left lower extremity dressing, right arm sling noted  Data Reviewed:      Latest Ref Rng & Units 10/26/2023    6:20 AM 10/25/2023    6:37 AM 10/24/2023   10:10 PM  CBC  WBC 4.0 - 10.5 K/uL 8.1  8.6  9.3   Hemoglobin  13.0 - 17.0 g/dL 6.6  7.1  7.3   Hematocrit 39.0 - 52.0 % 23.4  24.6  26.1   Platelets 150 - 400 K/uL 119  130  143       Latest Ref Rng & Units 10/26/2023    6:20 AM 10/25/2023    6:37 AM 10/24/2023    6:43 PM  BMP  Glucose 70 - 99 mg/dL 884  872  881   BUN 8 - 23 mg/dL 15  16  20    Creatinine 0.61 - 1.24 mg/dL 9.02  9.04  9.08   Sodium 135 - 145 mmol/L 137  135  135   Potassium 3.5 - 5.1 mmol/L 3.1  3.1  3.5   Chloride 98 - 111 mmol/L 95  96  94   CO2 22 - 32 mmol/L 30  30  29    Calcium 8.9 -  10.3 mg/dL 8.8  8.7  8.5    CT CERVICAL SPINE WO CONTRAST Result Date: 10/24/2023 CLINICAL DATA:  Fall with moderate to severe head and neck trauma. EXAM: CT HEAD WITHOUT CONTRAST CT CERVICAL SPINE WITHOUT CONTRAST TECHNIQUE: Multidetector CT imaging of the head and cervical spine was performed following the standard protocol without intravenous contrast. Multiplanar CT image reconstructions of the cervical spine were also generated. RADIATION DOSE REDUCTION: This exam was performed according to the departmental dose-optimization program which includes automated exposure control, adjustment of the mA and/or kV according to patient size and/or use of iterative reconstruction technique. COMPARISON:  Head CT 07/07/2021, CT scan cervical spine 10/28/2020. FINDINGS: CT HEAD FINDINGS Brain: There are mild features of cerebral atrophy and small-vessel disease. Cerebellum and brainstem are unremarkable. Ventricles are normal in size and position. Chronic dystrophic calcification extends along the frontal falx. No hemorrhage, cortical based acute infarct, mass or mass effect are seen. Basal cisterns are clear. Vascular: No hyperdense vessel or unexpected calcification. Skull: Negative for fractures or focal lesions. No visible scalp hematoma. Sinuses/Orbits: Mild membrane disease both maxillary sinuses. Other paranasal sinuses, bilateral mastoids, and middle ear cavities are clear. S shaped nasal septum with left-sided spurring. Other: None. CT CERVICAL SPINE FINDINGS Alignment: Within normal limits apart from bone-on-bone anterior atlantodental joint space loss with osteophytes, seen previously. Skull base and vertebrae: Normal bone mineralization. Motion artifact limits imaging through the upper 3 cervical levels. Accounting for motion, no acute cervical fracture or focal pathologic process is suspected. Soft tissues and spinal canal: No prevertebral fluid or swelling. No visible canal hematoma. Trace calcific plaque  both proximal cervical ICAs. There is a heterogeneous 2.2 cm in nodule in the left lobe of the thyroid  gland, 1.2 cm hypodense nodule in the right lobe. These are not grossly changed in size. The nodules have been evaluated previously on thyroid  ultrasound 08/14/2020 with the left-sided nodule recommended for a 1 year follow-up exam. This is the last thyroid  ultrasound in our system. If this nodule has not been restudied elsewhere, nonemergent thyroid  ultrasound follow-up is recommended to compare with the 2022 study. Visualized larynx demonstrates no mass. Portions were excluded from the field anteriorly. Disc levels: The discs are diffusely degenerated particularly from C4-5 through C7-T1 where there is complete chronic disc collapse. There are bidirectional endplate osteophytes all levels. Short pedicles exacerbate the mass effect from posterior disc osteophyte complexes, and reduce the effective AP canal diameter from C3-7. As a result, there is mild spondylotic cord compression with spinal stenosis at C3-4, spondylotic compression of the left hemicord at C4-5, with broad-based spondylotic cord  compression and spinal stenosis C5-6 and C6-7. There are multilevel uncinate and facet osteophytes and acquired foraminal stenosis, greatest on the right at C4-5, with severe foraminal narrowing bilaterally at C5-6 and C6-7. Upper chest: Negative. Other: None. IMPRESSION: 1. No acute intracranial CT findings or depressed skull fractures. 2. Atrophy and small-vessel disease.  Stable exam. 3. Motion artifact limits imaging through the upper 3 cervical levels. Accounting for this, no acute cervical fractures are suspected. 4. Multilevel cervical degenerative change, spondylotic cord compression and acquired foraminal stenosis. 5. Bilateral thyroid  nodules, not grossly changed in size. Follow-up was recommended in 2022. If there has not been intervening thyroid  ultrasound elsewhere, nonemergent follow-up ultrasound is  recommended. 6. Mild maxillary sinus membrane disease.  S shaped nasal septum. 7. Trace proximal cervical ICA atherosclerosis. Electronically Signed   By: Francis Quam M.D.   On: 10/24/2023 21:43   CT HEAD WO CONTRAST Result Date: 10/24/2023 CLINICAL DATA:  Fall with moderate to severe head and neck trauma. EXAM: CT HEAD WITHOUT CONTRAST CT CERVICAL SPINE WITHOUT CONTRAST TECHNIQUE: Multidetector CT imaging of the head and cervical spine was performed following the standard protocol without intravenous contrast. Multiplanar CT image reconstructions of the cervical spine were also generated. RADIATION DOSE REDUCTION: This exam was performed according to the departmental dose-optimization program which includes automated exposure control, adjustment of the mA and/or kV according to patient size and/or use of iterative reconstruction technique. COMPARISON:  Head CT 07/07/2021, CT scan cervical spine 10/28/2020. FINDINGS: CT HEAD FINDINGS Brain: There are mild features of cerebral atrophy and small-vessel disease. Cerebellum and brainstem are unremarkable. Ventricles are normal in size and position. Chronic dystrophic calcification extends along the frontal falx. No hemorrhage, cortical based acute infarct, mass or mass effect are seen. Basal cisterns are clear. Vascular: No hyperdense vessel or unexpected calcification. Skull: Negative for fractures or focal lesions. No visible scalp hematoma. Sinuses/Orbits: Mild membrane disease both maxillary sinuses. Other paranasal sinuses, bilateral mastoids, and middle ear cavities are clear. S shaped nasal septum with left-sided spurring. Other: None. CT CERVICAL SPINE FINDINGS Alignment: Within normal limits apart from bone-on-bone anterior atlantodental joint space loss with osteophytes, seen previously. Skull base and vertebrae: Normal bone mineralization. Motion artifact limits imaging through the upper 3 cervical levels. Accounting for motion, no acute cervical  fracture or focal pathologic process is suspected. Soft tissues and spinal canal: No prevertebral fluid or swelling. No visible canal hematoma. Trace calcific plaque both proximal cervical ICAs. There is a heterogeneous 2.2 cm in nodule in the left lobe of the thyroid  gland, 1.2 cm hypodense nodule in the right lobe. These are not grossly changed in size. The nodules have been evaluated previously on thyroid  ultrasound 08/14/2020 with the left-sided nodule recommended for a 1 year follow-up exam. This is the last thyroid  ultrasound in our system. If this nodule has not been restudied elsewhere, nonemergent thyroid  ultrasound follow-up is recommended to compare with the 2022 study. Visualized larynx demonstrates no mass. Portions were excluded from the field anteriorly. Disc levels: The discs are diffusely degenerated particularly from C4-5 through C7-T1 where there is complete chronic disc collapse. There are bidirectional endplate osteophytes all levels. Short pedicles exacerbate the mass effect from posterior disc osteophyte complexes, and reduce the effective AP canal diameter from C3-7. As a result, there is mild spondylotic cord compression with spinal stenosis at C3-4, spondylotic compression of the left hemicord at C4-5, with broad-based spondylotic cord compression and spinal stenosis C5-6 and C6-7. There are multilevel uncinate and facet  osteophytes and acquired foraminal stenosis, greatest on the right at C4-5, with severe foraminal narrowing bilaterally at C5-6 and C6-7. Upper chest: Negative. Other: None. IMPRESSION: 1. No acute intracranial CT findings or depressed skull fractures. 2. Atrophy and small-vessel disease.  Stable exam. 3. Motion artifact limits imaging through the upper 3 cervical levels. Accounting for this, no acute cervical fractures are suspected. 4. Multilevel cervical degenerative change, spondylotic cord compression and acquired foraminal stenosis. 5. Bilateral thyroid  nodules, not  grossly changed in size. Follow-up was recommended in 2022. If there has not been intervening thyroid  ultrasound elsewhere, nonemergent follow-up ultrasound is recommended. 6. Mild maxillary sinus membrane disease.  S shaped nasal septum. 7. Trace proximal cervical ICA atherosclerosis. Electronically Signed   By: Francis Quam M.D.   On: 10/24/2023 21:43   DG Tibia/Fibula Left Result Date: 10/24/2023 CLINICAL DATA:  Left lower extremity pain.  Fall. EXAM: LEFT TIBIA AND FIBULA - 2 VIEW COMPARISON:  None Available. FINDINGS: There is no evidence of fracture or other focal bone lesions. No erosions or periostitis. Degenerative change of the knee. Generalized soft tissue edema. IMPRESSION: Generalized soft tissue edema. No acute osseous abnormality. Electronically Signed   By: Andrea Gasman M.D.   On: 10/24/2023 19:06   DG Shoulder Right Result Date: 10/24/2023 CLINICAL DATA:  Right shoulder pain after fall. EXAM: RIGHT SHOULDER - 2+ VIEW COMPARISON:  10/28/2020 FINDINGS: Comminuted and displaced proximal humerus fracture. There is 9 mm displacement of a component involving the surgical neck. There is displacement involving the lateral humeral head. Displaced fragment is seen medially just inferior to the glenohumeral joint. No frank dislocation. Acromioclavicular degenerative spurring. IMPRESSION: Comminuted and displaced proximal humerus fracture. Electronically Signed   By: Andrea Gasman M.D.   On: 10/24/2023 19:05   DG Chest Port 1 View Result Date: 10/24/2023 CLINICAL DATA:  Trauma, right shoulder pain. EXAM: PORTABLE CHEST 1 VIEW COMPARISON:  Radiograph 03/04/2022, CT 07/08/2021 FINDINGS: Stable heart size and mediastinal contours. CardioMEMS on the left. Mild diffuse bronchial thickening. No pneumothorax, large pleural effusion or focal airspace disease. Remote right rib fractures. No evidence of acute rib fracture on portable exam. Right proximal humerus fracture is better assessed on concurrent  shoulder exam. IMPRESSION: 1. Right proximal humeral fracture, assessed on concurrent shoulder exam. No evidence of additional acute traumatic injury. 2. Mild diffuse bronchial thickening. Electronically Signed   By: Andrea Gasman M.D.   On: 10/24/2023 19:04    Family Communication: Discussed with patient, understand and agree. All questions answered.  Disposition: Status is: Inpatient Remains inpatient appropriate because: Pain control, wound care, PT OT  Planned Discharge Destination: Skilled nursing facility     Time spent: 42 minutes  Author: Concepcion Riser, MD 10/26/2023 5:23 PM Secure chat 7am to 7pm For on call review [email or web address].

## 2023-10-27 DIAGNOSIS — I5032 Chronic diastolic (congestive) heart failure: Secondary | ICD-10-CM | POA: Diagnosis not present

## 2023-10-27 DIAGNOSIS — S42291A Other displaced fracture of upper end of right humerus, initial encounter for closed fracture: Secondary | ICD-10-CM | POA: Diagnosis not present

## 2023-10-27 DIAGNOSIS — W19XXXA Unspecified fall, initial encounter: Secondary | ICD-10-CM | POA: Diagnosis not present

## 2023-10-27 DIAGNOSIS — S81811A Laceration without foreign body, right lower leg, initial encounter: Secondary | ICD-10-CM | POA: Diagnosis not present

## 2023-10-27 LAB — BASIC METABOLIC PANEL WITH GFR
Anion gap: 11 (ref 5–15)
Anion gap: 11 (ref 5–15)
BUN: 13 mg/dL (ref 8–23)
BUN: 14 mg/dL (ref 8–23)
CO2: 33 mmol/L — ABNORMAL HIGH (ref 22–32)
CO2: 33 mmol/L — ABNORMAL HIGH (ref 22–32)
Calcium: 8.1 mg/dL — ABNORMAL LOW (ref 8.9–10.3)
Calcium: 8 mg/dL — ABNORMAL LOW (ref 8.9–10.3)
Chloride: 90 mmol/L — ABNORMAL LOW (ref 98–111)
Chloride: 91 mmol/L — ABNORMAL LOW (ref 98–111)
Creatinine, Ser: 0.98 mg/dL (ref 0.61–1.24)
Creatinine, Ser: 1.02 mg/dL (ref 0.61–1.24)
GFR, Estimated: >60 mL/min (ref >60)
GFR, Estimated: >60 mL/min (ref >60)
Glucose, Bld: 163 mg/dL — ABNORMAL HIGH (ref 70–99)
Glucose, Bld: 109 mg/dL — ABNORMAL HIGH (ref 70–99)
Potassium: 2.6 mmol/L — CL (ref 3.5–5.1)
Potassium: 2.6 mmol/L — CL (ref 3.5–5.1)
Sodium: 134 mmol/L — ABNORMAL LOW (ref 135–145)
Sodium: 135 mmol/L (ref 135–145)

## 2023-10-27 LAB — BPAM RBC
Blood Product Expiration Date: 202508172359
ISSUE DATE / TIME: 202507151247
Unit Type and Rh: 9500

## 2023-10-27 LAB — TYPE AND SCREEN
ABO/RH(D): O POS
Antibody Screen: NEGATIVE
Unit division: 0

## 2023-10-27 LAB — CBC
HCT: 25.6 % — ABNORMAL LOW (ref 39.0–52.0)
Hemoglobin: 7.3 g/dL — ABNORMAL LOW (ref 13.0–17.0)
MCH: 20.3 pg — ABNORMAL LOW (ref 26.0–34.0)
MCHC: 28.5 g/dL — ABNORMAL LOW (ref 30.0–36.0)
MCV: 71.3 fL — ABNORMAL LOW (ref 80.0–100.0)
Platelets: 120 K/uL — ABNORMAL LOW (ref 150–400)
RBC: 3.59 MIL/uL — ABNORMAL LOW (ref 4.22–5.81)
RDW: 18.6 % — ABNORMAL HIGH (ref 11.5–15.5)
WBC: 7.2 K/uL (ref 4.0–10.5)
nRBC: 0 % (ref 0.0–0.2)

## 2023-10-27 LAB — HEMOGLOBIN A1C
Hgb A1c MFr Bld: 5.2 % (ref 4.8–5.6)
Mean Plasma Glucose: 102.54 mg/dL

## 2023-10-27 MED ORDER — POTASSIUM CHLORIDE CRYS ER 20 MEQ PO TBCR
60.0000 meq | EXTENDED_RELEASE_TABLET | Freq: Four times a day (QID) | ORAL | Status: DC
  Filled 2023-10-27: qty 3

## 2023-10-27 MED ORDER — POTASSIUM CHLORIDE 10 MEQ/100ML IV SOLN
10.0000 meq | INTRAVENOUS | Status: DC
  Administered 2023-10-27: 10 meq via INTRAVENOUS
  Filled 2023-10-27: qty 100

## 2023-10-27 MED ORDER — IRON SUCROSE 200 MG IVPB - SIMPLE MED
200.0000 mg | Freq: Once | Status: AC
  Administered 2023-10-27: 200 mg via INTRAVENOUS
  Filled 2023-10-27: qty 200

## 2023-10-27 MED ORDER — POTASSIUM CHLORIDE 10 MEQ/100ML IV SOLN: 10.0000 meq | INTRAVENOUS | Status: DC

## 2023-10-27 MED ORDER — POTASSIUM CHLORIDE CRYS ER 20 MEQ PO TBCR
40.0000 meq | EXTENDED_RELEASE_TABLET | Freq: Four times a day (QID) | ORAL | Status: DC
  Administered 2023-10-27 (×4): 40 meq via ORAL
  Filled 2023-10-27 (×4): qty 2

## 2023-10-27 MED ORDER — POTASSIUM CHLORIDE CRYS ER 20 MEQ PO TBCR
40.0000 meq | EXTENDED_RELEASE_TABLET | Freq: Once | ORAL | Status: AC
  Administered 2023-10-27: 40 meq via ORAL
  Filled 2023-10-27: qty 2

## 2023-10-27 MED ORDER — FERROUS SULFATE 325 (65 FE) MG PO TABS
325.0000 mg | ORAL_TABLET | Freq: Every day | ORAL | Status: DC
  Administered 2023-10-28 – 2023-11-04 (×8): 325 mg via ORAL
  Filled 2023-10-27 (×8): qty 1

## 2023-10-27 MED ORDER — FERROUS SULFATE 325 (65 FE) MG PO TBEC
325.0000 mg | DELAYED_RELEASE_TABLET | Freq: Every day | ORAL | Status: DC
  Filled 2023-10-27: qty 1

## 2023-10-27 NOTE — Progress Notes (Signed)
 Progress Note   Patient: Charles Gray FMW:[id number] DOB: 11/24/1955 DOA: 10/24/2023     2 DOS: the patient was seen and examined on 10/27/2023   Brief hospital course: Charles Gray is a 68 y.o. male with medical history significant for venous insufficiency, type 2 diabetes, paroxysmal A-fib on Eliquis , chronic diastolic CHF, COPD, chronic pain syndrome on Suboxone , who presents to the ER after a mechanical fall at home.   Patient sustained comminuted displaced proximal right humerus fracture, was placed on a splint in ED.  He also had laceration of her left lower extremity, admitted to [facility] service for further management with orthopedic surgery consultation.  Assessment and Plan: Status post mechanical fall Comminuted and displaced proximal right humerus fracture Left lower extremity laceration injury- Orthopedic surgery suggested non operative conservative management for right humerus fracture and wound care for left leg injury. Continue pain control, splint in place, wound care for left leg laceration. PT OT evaluation advised to SNF.  Acute blood loss anemia- Patient's hemoglobin 7.3 s/p 1 unit PRBCs. Continue to closely monitor H&H and transfuse for hemoglobin less than 7.  Chronic systolic CHF Patient is euvolemic.  Continue home dose torsemide , metolazone , Toprol -XL, Aldactone .  Paroxysmal A-fib Patient is currently on beta-blocker, Eliquis  therapy. Continue telemetry.  Hypokalemia- Due to diuresis. K 2.6 today. He does not want to take IV kcl. Discussed with Pharmacy Increased oral kcl to 40 qid. Continue to monitor daily electrolytes.  Obesity with BMI 31.89 Diet, exercise and weight reduction advised.  Type 2 diabetes mellitus Diet controlled.  Check A1c. Blood sugar stable.      Out of bed to chair. Incentive spirometry. Nursing supportive care. Fall, aspiration precautions. Diet:  Diet Orders (From admission, onward)     Start     Ordered   10/25/23  1100  Diet regular Room service appropriate? Yes; Fluid consistency: Thin  Diet effective now       Question Answer Comment  Room service appropriate? Yes   Fluid consistency: Thin      10/25/23 1059           DVT prophylaxis:  apixaban  (ELIQUIS ) tablet 5 mg  Level of care: Telemetry Surgical   Code Status: Full Code  Subjective: Patient is seen and examined today morning.  He is refusing to take IV Kcl due to burning discomfort. Has constipation. Agrees to SNF but wishes to talk to son regarding disposition.  Physical Exam: Vitals:   10/26/23 1547 10/26/23 2036 10/27/23 0439 10/27/23 0730  BP: 116/66 (!) 117/58 112/68 124/64  Pulse: 88 87 75 81  Resp: 19 18 18 16   Temp: 99.3 F (37.4 C) 99.6 F (37.6 C) 98.3 F (36.8 C) 98.1 F (36.7 C)  TempSrc: Oral Oral Oral Oral  SpO2: 99% 95% 100% 100%  Weight:      Height:        General - Elderly obese Caucasian male, distress due to pain HEENT - PERRLA, EOMI, atraumatic head, non tender sinuses. Lung - Clear, basal rales, rhonchi, no wheezes. Heart - S1, S2 heard, no murmurs, rubs, 3+ pitting pedal edema. Abdomen - Soft, non tender, obese, bowel sounds good Neuro - Alert, awake and oriented x 3, non focal exam. Skin - Warm and dry.  Chronic lower extremity skin changes, left lower extremity dressing, right arm sling noted  Data Reviewed:      Latest Ref Rng & Units 10/27/2023    5:37 AM 10/26/2023    8:13 PM 10/26/2023  6:20 AM  CBC  WBC 4.0 - 10.5 K/uL 7.2   8.1   Hemoglobin 13.0 - 17.0 g/dL 7.3  7.0  6.6   Hematocrit 39.0 - 52.0 % 25.6  24.1  23.4   Platelets 150 - 400 K/uL 120   119       Latest Ref Rng & Units 10/27/2023    5:37 AM 10/26/2023    6:20 AM 10/25/2023    6:37 AM  BMP  Glucose 70 - 99 mg/dL 836  884  872   BUN 8 - 23 mg/dL 13  15  16    Creatinine 0.61 - 1.24 mg/dL 9.01  9.02  9.04   Sodium 135 - 145 mmol/L 134  137  135   Potassium 3.5 - 5.1 mmol/L 2.6  3.1  3.1   Chloride 98 - 111 mmol/L 90   95  96   CO2 22 - 32 mmol/L 33  30  30   Calcium 8.9 - 10.3 mg/dL 8.1  8.8  8.7    No results found.   Family Communication: Discussed with patient, understand and agree. All questions answered.  Disposition: Status is: Inpatient Remains inpatient appropriate because: Pain control, wound care, electorlyte repletion.  Planned Discharge Destination: Skilled nursing facility     Time spent: 41 minutes  Author: Concepcion Riser, MD 10/27/2023 1:48 PM Secure chat 7am to 7pm For on call review [email or web address].

## 2023-10-27 NOTE — TOC Progression Note (Addendum)
 Transition of Care [facility]) - Progression Note    Patient Details  Name: Charles Gray MRN: [id number] Date of Birth: January 14, 1956  Transition of Care [facility] CM/SW Contact  Bridget Cordella Simmonds, LCSW Phone Number: 10/27/2023, 11:57 AM  Clinical Narrative:   CSW spoke with pt regarding bed offers.  Pt stating he would like some assistance from his son with choosing SNF, son currently on vacation out of the country, has not been able to reach him.    Unable to reach son [name] by phone.  Attempting to text him.    1415: CSW received call from pt son [name] in [place], was able to put him on speakerphone with pt, they discussed SNF options and will accept offer at [facility] rehab.    CSW confirmed with [facility] that they can receive pt with auth.  Auth request submitted in [place].  Expected Discharge Plan: Skilled Nursing Facility Barriers to Discharge: SNF Pending bed offer, Continued Medical Work up  Expected Discharge Plan and Services In-house Referral: Clinical Social Work   Post Acute Care Choice: Skilled Nursing Facility Living arrangements for the past 2 months: Single Family Home                                       Social Determinants of Health (SDOH) Interventions SDOH Screenings   Food Insecurity: No Food Insecurity (03/08/2022)  Housing: Low Risk  (03/08/2022)  Transportation Needs: Unmet Transportation Needs (04/21/2022)  Utilities: Not At Risk (03/08/2022)  Alcohol Screen: Low Risk  (06/11/2020)  Depression (PHQ2-9): Medium Risk (06/11/2020)  Financial Resource Strain: Medium Risk (11/15/2020)  Tobacco Use: Low Risk  (10/24/2023)    Readmission Risk Interventions     No data to display

## 2023-10-28 DIAGNOSIS — W19XXXA Unspecified fall, initial encounter: Secondary | ICD-10-CM | POA: Diagnosis not present

## 2023-10-28 DIAGNOSIS — I5032 Chronic diastolic (congestive) heart failure: Secondary | ICD-10-CM | POA: Diagnosis not present

## 2023-10-28 DIAGNOSIS — S42291A Other displaced fracture of upper end of right humerus, initial encounter for closed fracture: Secondary | ICD-10-CM | POA: Diagnosis not present

## 2023-10-28 DIAGNOSIS — S81811A Laceration without foreign body, right lower leg, initial encounter: Secondary | ICD-10-CM | POA: Diagnosis not present

## 2023-10-28 LAB — CBC
HCT: 27 % — ABNORMAL LOW (ref 39.0–52.0)
Hemoglobin: 7.8 g/dL — ABNORMAL LOW (ref 13.0–17.0)
MCH: 20.6 pg — ABNORMAL LOW (ref 26.0–34.0)
MCHC: 28.9 g/dL — ABNORMAL LOW (ref 30.0–36.0)
MCV: 71.2 fL — ABNORMAL LOW (ref 80.0–100.0)
Platelets: 134 K/uL — ABNORMAL LOW (ref 150–400)
RBC: 3.79 MIL/uL — ABNORMAL LOW (ref 4.22–5.81)
RDW: 19.5 % — ABNORMAL HIGH (ref 11.5–15.5)
WBC: 7.3 K/uL (ref 4.0–10.5)
nRBC: 0 % (ref 0.0–0.2)

## 2023-10-28 LAB — BASIC METABOLIC PANEL WITH GFR
Anion gap: 12 (ref 5–15)
Anion gap: 9 (ref 5–15)
BUN: 14 mg/dL (ref 8–23)
BUN: 14 mg/dL (ref 8–23)
CO2: 32 mmol/L (ref 22–32)
CO2: 33 mmol/L — ABNORMAL HIGH (ref 22–32)
Calcium: 8.1 mg/dL — ABNORMAL LOW (ref 8.9–10.3)
Calcium: 8.3 mg/dL — ABNORMAL LOW (ref 8.9–10.3)
Chloride: 91 mmol/L — ABNORMAL LOW (ref 98–111)
Chloride: 92 mmol/L — ABNORMAL LOW (ref 98–111)
Creatinine, Ser: 0.81 mg/dL (ref 0.61–1.24)
Creatinine, Ser: 1.02 mg/dL (ref 0.61–1.24)
GFR, Estimated: >60 mL/min (ref >60)
GFR, Estimated: >60 mL/min (ref >60)
Glucose, Bld: 117 mg/dL — ABNORMAL HIGH (ref 70–99)
Glucose, Bld: 130 mg/dL — ABNORMAL HIGH (ref 70–99)
Potassium: 2.6 mmol/L — CL (ref 3.5–5.1)
Potassium: 2.9 mmol/L — ABNORMAL LOW (ref 3.5–5.1)
Sodium: 135 mmol/L (ref 135–145)
Sodium: 134 mmol/L — ABNORMAL LOW (ref 135–145)

## 2023-10-28 LAB — MAGNESIUM: Magnesium: 1.8 mg/dL (ref 1.7–2.4)

## 2023-10-28 MED ORDER — POTASSIUM CHLORIDE 20 MEQ PO PACK
40.0000 meq | PACK | ORAL | Status: DC
  Administered 2023-10-28 – 2023-11-03 (×41): 40 meq via ORAL
  Filled 2023-10-28 (×41): qty 2

## 2023-10-28 MED ORDER — POTASSIUM CHLORIDE 20 MEQ PO PACK
40.0000 meq | PACK | ORAL | Status: DC
  Administered 2023-10-28 (×3): 40 meq via ORAL
  Filled 2023-10-28 (×3): qty 2

## 2023-10-28 MED ORDER — POTASSIUM CHLORIDE CRYS ER 20 MEQ PO TBCR
40.0000 meq | EXTENDED_RELEASE_TABLET | ORAL | Status: DC
  Administered 2023-10-29 – 2023-11-03 (×3): 40 meq via ORAL
  Filled 2023-10-28 (×4): qty 2

## 2023-10-28 NOTE — Progress Notes (Signed)
 Physical Therapy Treatment Patient Details Name: Charles Gray MRN: [id number] DOB: 11/29/1955 Today's Date: 10/28/2023   History of Present Illness Pt is a 68 y.o. M presenting to [facility] on 10/24/23 after a fall at home using his walker. IMG shows comminuted and displaced R proximal humerus fx and LLE laceration; plan for nonoperative management. PMH: CHF, A. fib, hypertension, hyperlipidemia, OSA, fatty liver disease    PT Comments  Pt resting in bed on arrival, agreeable to session with slow but steady progress towards acute goals. Pt continues to be limited by global pain with all mobility and gross weakness. Pt able to progress bed mobility this session walking Les to EOB and elevating trunk to sitting with grossly mod A, with increased time needed to complete and cues for optimal sequencing. Pt able to complete seated LE exercises for increased ROM and strength maintenance and maintain sitting up EOB ~15 mins with pt remaining up EOB at end of session on hand off to OT. Pt motivated to improve and will benefit from continued inpatient follow up therapy, <3 hours/day to address deficits and maximize functional independence and decrease caregiver burden. Pt continues to benefit from skilled PT services to progress toward functional mobility goals.     If plan is discharge home, recommend the following: A lot of help with walking and/or transfers;A lot of help with bathing/dressing/bathroom;Assistance with cooking/housework;Assist for transportation;Help with stairs or ramp for entrance   Can travel by private vehicle     No  Equipment Recommendations  Wheelchair (measurements PT);Wheelchair cushion (measurements PT);Hospital bed;Hoyer lift    Recommendations for Other Services       Precautions / Restrictions Precautions Precautions: Fall Recall of Precautions/Restrictions: Intact Required Braces or Orthoses: Sling Restrictions Weight Bearing Restrictions Per Provider Order: Yes RUE  Weight Bearing Per Provider Order: Non weight bearing     Mobility  Bed Mobility Overal bed mobility: Needs Assistance Bed Mobility: Supine to Sit     Supine to sit: HOB elevated, Mod assist     General bed mobility comments: mod A to walk LEs incrimentally to L EOB, pt able to elevate trunk with mod A with HOB max elevated. mod A with bed pads to scoot out to EOB, pt needing significantly increased time to complete bed mobility    Transfers Overall transfer level: Needs assistance                 General transfer comment: unable this date due to pain    Ambulation/Gait                   Stairs             Wheelchair Mobility     Tilt Bed    Modified Rankin (Stroke Patients Only)       Balance Overall balance assessment: Needs assistance Sitting-balance support: Single extremity supported, Feet unsupported Sitting balance-Leahy Scale: Fair Sitting balance - Comments: seated EOB w/ LUE stabilizing, flexed posture but able to extend trunk with cues                                    Communication Communication Communication: No apparent difficulties  Cognition Arousal: Alert Behavior During Therapy: WFL for tasks assessed/performed   PT - Cognitive impairments: No apparent impairments  Following commands: Intact      Cueing Cueing Techniques: Verbal cues, Visual cues  Exercises General Exercises - Lower Extremity Long Arc Quad: AAROM, Left, 5 reps, AROM, Right, Seated Hip Flexion/Marching: AAROM, Right, Left, 5 reps, Seated Toe Raises: AROM, Right, Left, 5 reps, Seated Heel Raises: AROM, Right, Left, 5 reps, Seated    General Comments General comments (skin integrity, edema, etc.): LE banadaging in place, clean and dry      Pertinent Vitals/Pain Pain Assessment Pain Assessment: Faces Faces Pain Scale: Hurts even more Pain Location: R shoulder, L shoulder, LLE Pain Descriptors /  Indicators: Guarding, Grimacing, Sharp Pain Intervention(s): Monitored during session, Limited activity within patient's tolerance, Other (comment) (sling readjusted)    Home Living                          Prior Function            PT Goals (current goals can now be found in the care plan section) Acute Rehab PT Goals Patient Stated Goal: to get better PT Goal Formulation: With patient Time For Goal Achievement: 11/08/23 Progress towards PT goals: Progressing toward goals    Frequency    Min 2X/week      PT Plan      Co-evaluation              AM-PAC PT 6 Clicks Mobility   Outcome Measure  Help needed turning from your back to your side while in a flat bed without using bedrails?: A Lot Help needed moving from lying on your back to sitting on the side of a flat bed without using bedrails?: A Lot Help needed moving to and from a bed to a chair (including a wheelchair)?: Total Help needed standing up from a chair using your arms (e.g., wheelchair or bedside chair)?: Total Help needed to walk in hospital room?: Total Help needed climbing 3-5 steps with a railing? : Total 6 Click Score: 8    End of Session   Activity Tolerance: Patient limited by pain Patient left: in bed;Other (comment) (seated up EOB on handoff to OT) Nurse Communication: Mobility status PT Visit Diagnosis: Muscle weakness (generalized) (M62.81);History of falling (Z91.81);Pain;Adult, failure to thrive (R62.7) Pain - Right/Left: Right Pain - part of body: Shoulder     Time: 9144-9074 PT Time Calculation (min) (ACUTE ONLY): 30 min  Charges:    $Therapeutic Exercise: 8-22 mins $Therapeutic Activity: 8-22 mins PT General Charges $$ ACUTE PT VISIT: 1 Visit                     Renella Steig R. PTA Acute Rehabilitation Services Office: [phone]   Therisa CHRISTELLA Boor 10/28/2023, 9:35 AM

## 2023-10-28 NOTE — Plan of Care (Signed)

## 2023-10-28 NOTE — Progress Notes (Addendum)
 Progress Note   Patient: Charles Gray FMW:[id number] DOB: 1955-12-02 DOA: 10/24/2023     3 DOS: the patient was seen and examined on 10/28/2023   Brief hospital course: Jaques Mineer is a 68 y.o. male with medical history significant for venous insufficiency, type 2 diabetes, paroxysmal A-fib on Eliquis , chronic diastolic CHF, COPD, chronic pain syndrome on Suboxone , who presents to the ER after a mechanical fall at home.   Patient sustained comminuted displaced proximal right humerus fracture, was placed on a splint in ED.  He also had laceration of her left lower extremity, admitted to [facility] service for further management with orthopedic surgery consultation.  Assessment and Plan: Status post mechanical fall Comminuted and displaced proximal right humerus fracture Left lower extremity laceration injury- Orthopedic surgery suggested non operative conservative management for right humerus fracture and wound care for left leg injury. Continue pain control, splint in place, wound care for left leg laceration. PT OT evaluation advised to SNF.  Acute blood loss anemia- Patient's hemoglobin 7.8 today s/p 1 unit PRBCs 10/26/23 Continue to closely monitor H&H and transfuse for hemoglobin less than 7.  Chronic diastolic CHF Patient is euvolemic.  Continue home dose torsemide , metolazone , Toprol -XL, Aldactone .  Paroxysmal A-fib Patient is currently on metoprolol  xl 25, Eliquis  therapy. Continue telemetry.  Persistent severe Hypokalemia- Due to diuresis. K 2.6 again today even after receiving 200meq kcl oral. He refused to take IV kcl. Discussed with Pharmacy oral kcl divided into 40meq x 8 times daily with 40 meq extra dose with metolazone  MWF schedule. Mag 1.8. Continue to monitor daily electrolytes.  Obesity with BMI 31.89 Diet, exercise and weight reduction advised.  Type 2 diabetes mellitus Diet controlled.   A1c 5.2. Blood sugar stable.      Out of bed to chair. Incentive  spirometry. Nursing supportive care. Fall, aspiration precautions. Diet:  Diet Orders (From admission, onward)     Start     Ordered   10/25/23 1100  Diet regular Room service appropriate? Yes; Fluid consistency: Thin  Diet effective now       Question Answer Comment  Room service appropriate? Yes   Fluid consistency: Thin      10/25/23 1059           DVT prophylaxis:  apixaban  (ELIQUIS ) tablet 5 mg  Level of care: Telemetry Surgical   Code Status: Full Code  Subjective: Patient is seen and examined today morning. Continues to have pain over right elbow, left leg. Has constipation, took lactulose . He is upset K remained low.   Physical Exam: Vitals:   10/28/23 0546 10/28/23 0748 10/28/23 1134 10/28/23 1358  BP: 107/68 (!) 105/56 121/63 (!) 105/51  Pulse: 78 79 89 84  Resp: 16  16   Temp: 98.5 F (36.9 C) 98.9 F (37.2 C) 99 F (37.2 C) 98.6 F (37 C)  TempSrc: Oral Oral Axillary Oral  SpO2: 96% 99% 97% 97%  Weight:      Height:        General - Elderly obese Caucasian male, distress due to pain HEENT - PERRLA, EOMI, atraumatic head, non tender sinuses. Lung - Clear, basal rales, rhonchi, no wheezes. Heart - S1, S2 heard, no murmurs, rubs, 3+ pitting pedal edema. Abdomen - Soft, non tender, obese, bowel sounds good Neuro - Alert, awake and oriented x 3, non focal exam. Skin - Warm and dry.  Chronic lower extremity skin changes, left lower extremity dressing, right arm sling noted  Data Reviewed:  Latest Ref Rng & Units 10/28/2023    6:08 AM 10/27/2023    5:37 AM 10/26/2023    8:13 PM  CBC  WBC 4.0 - 10.5 K/uL 7.3  7.2    Hemoglobin 13.0 - 17.0 g/dL 7.8  7.3  7.0   Hematocrit 39.0 - 52.0 % 27.0  25.6  24.1   Platelets 150 - 400 K/uL 134  120        Latest Ref Rng & Units 10/28/2023    6:08 AM 10/27/2023    5:26 PM 10/27/2023    5:37 AM  BMP  Glucose 70 - 99 mg/dL 882  890  836   BUN 8 - 23 mg/dL 14  14  13    Creatinine 0.61 - 1.24 mg/dL 9.18  8.97   9.01   Sodium 135 - 145 mmol/L 135  135  134   Potassium 3.5 - 5.1 mmol/L 2.6  2.6  2.6   Chloride 98 - 111 mmol/L 91  91  90   CO2 22 - 32 mmol/L 32  33  33   Calcium 8.9 - 10.3 mg/dL 8.1  8.0  8.1    No results found.   Family Communication: Discussed with patient, understand and agree. All questions answered.  Disposition: Status is: Inpatient Remains inpatient appropriate because: Pain control, wound care, electorlyte repletion.  Planned Discharge Destination: Skilled nursing facility     Time spent: 43 minutes  Author: Concepcion Riser, MD 10/28/2023 4:44 PM Secure chat 7am to 7pm For on call review [email or web address].

## 2023-10-28 NOTE — Progress Notes (Signed)
 Occupational Therapy Treatment Patient Details Name: Charles Gray MRN: [id number] DOB: December 09, 1955 Today's Date: 10/28/2023   History of present illness Pt is a 68 y.o. M presenting to [facility] on 10/24/23 after a fall at home using his walker. IMG shows comminuted and displaced R proximal humerus fx and LLE laceration; plan for nonoperative management. PMH: CHF, A. fib, hypertension, hyperlipidemia, OSA, fatty liver disease   OT comments  Dovetail with PT with pt sitting on EOB upon therapy arrival. Pt reports increased pain in right and left UE this date. Pt reports that left shoulder pain has increased since initial injury to RUE. Manual techniques completed to address. Pt with good response to myofascial release. Pt education provided on pain management techniques for BUE including proper support/positioning and use of ice pack PRN for pain. Patient will benefit from continued inpatient follow up therapy, <3 hours/day.        If plan is discharge home, recommend the following:  Two people to help with walking and/or transfers;Two people to help with bathing/dressing/bathroom;Help with stairs or ramp for entrance;Assist for transportation;Assistance with cooking/housework   Equipment Recommendations  Other (comment) (defer to next venue of care)       Precautions / Restrictions Precautions Precautions: Fall;Shoulder Type of Shoulder Precautions: R humerus fx - non op management. OK wrist and elbow ROM Shoulder Interventions: Shoulder sling/immobilizer;At all times;Off for dressing/bathing/exercises Recall of Precautions/Restrictions: Intact Required Braces or Orthoses: Sling Restrictions Weight Bearing Restrictions Per Provider Order: Yes RUE Weight Bearing Per Provider Order: Non weight bearing       Mobility Bed Mobility Overal bed mobility: Needs Assistance Bed Mobility: Sit to Supine       Sit to supine: Max assist, +2 for physical assistance, Used rails   General bed  mobility comments: 2 person assist to transition from sit to supine with bed pads used to perform helicopter method. Pt able to tolerate long sitting for a brief period until [facility] elevated to provide support d/t back pain. Bed placed in trendelenburg to complete boost at total assist.       Balance Overall balance assessment: Needs assistance Sitting-balance support: Single extremity supported, Feet supported Sitting balance-Leahy Scale: Good Sitting balance - Comments: seated EOB. Able to weight shift during OT session (right/left) without physical assist.       ADL either performed or assessed with clinical judgement    Extremity/Trunk Assessment Upper Extremity Assessment RUE Deficits / Details: Increased edema noted in elbow and forearm region. Sling adjust during PT session. Pt able to demonstrate functional hand mobility. Some active wrist flexion/extension demonstrated within sling constraints. LUE Deficits / Details: Max fascial restrictions noted in anterior shoulder and upper trapezius region. Audible crepitation with A/ROM. Pt demonstrating less A/ROM this session versus at evaluation. Myofascial release and manual stretching completed to left shoulder, upper arm, and trapezius region to decrease fascial restrictions and increase ROM in a pain free zone.                     Communication Communication Communication: No apparent difficulties   Cognition Arousal: Alert Behavior During Therapy: WFL for tasks assessed/performed Cognition: No apparent impairments   Following commands: Intact        Cueing   Cueing Techniques: Verbal cues, Visual cues  Exercises Other Exercises Other Exercises: Pt educated on use of pillows for edema management and positioning needs while in bed or up in recliner. Other Exercises: seated, P/ROM, Left shoulder, flexion, 5X, 1 set. Able to  tolerate to slightly below shoulder level. Other Exercises: seated, A/ROM, scapular retraction, 10X, 1  set       General Comments LE banadaging in place, clean and dry    Pertinent Vitals/ Pain       Pain Assessment Pain Assessment: 0-10 Pain Score: 10-Worst pain ever Faces Pain Scale: Hurts worst Pain Location: R shoulder, L shoulder Pain Descriptors / Indicators: Guarding, Grimacing, Sharp, Constant, Aching, Sore Pain Intervention(s): Limited activity within patient's tolerance, Monitored during session, Repositioned         Frequency  Min 2X/week        Progress Toward Goals  OT Goals(current goals can now be found in the care plan section)  Progress towards OT goals: Progressing toward goals            AM-PAC OT 6 Clicks Daily Activity     Outcome Measure   Help from another person eating meals?: A Little Help from another person taking care of personal grooming?: A Lot Help from another person toileting, which includes using toliet, bedpan, or urinal?: Total Help from another person bathing (including washing, rinsing, drying)?: Total Help from another person to put on and taking off regular upper body clothing?: Total Help from another person to put on and taking off regular lower body clothing?: Total 6 Click Score: 9    End of Session Equipment Utilized During Treatment: Other (comment) (sling)  OT Visit Diagnosis: Other abnormalities of gait and mobility (R26.89);Unsteadiness on feet (R26.81);Muscle weakness (generalized) (M62.81)   Activity Tolerance Patient limited by pain   Patient Left in bed;with call bell/phone within reach;with nursing/sitter in room;with bed alarm set           Time: 9072-8996 OT Time Calculation (min): 36 min  Charges: OT General Charges $OT Visit: 1 Visit OT Treatments $Therapeutic Activity: 8-22 mins $Massage: 8-22 mins  Leita Howell, OTR/L,CBIS  Supplemental OT - [facility] and [facility] Secure Chat Preferred    Lelani Garnett, Leita BIRCH 10/28/2023, 11:09 AM

## 2023-10-28 NOTE — TOC Progression Note (Signed)
 Transition of Care [facility]) - Progression Note    Patient Details  Name: Charles Gray MRN: [id number] Date of Birth: [birth year]  Transition of Care [facility]) CM/SW Contact  Gwenn Frieze [place], [place] Phone Number: 10/28/2023, 1:52 PM  Clinical Narrative:  [facility] requesting updated therapy notes which have been uploaded. Auth for [employer] remains pending at this time. Will provide updates as available.   Frieze Gwenn, MSW, LCSW [phone] (coverage)       Expected Discharge Plan: Skilled Nursing Facility Barriers to Discharge: SNF Pending bed offer, Continued Medical Work up  Expected Discharge Plan and Services In-house Referral: Clinical Social Work   Post Acute Care Choice: Skilled Nursing Facility Living arrangements for the past 2 months: Single Family Home                                       Social Determinants of Health (SDOH) Interventions SDOH Screenings   Food Insecurity: No Food Insecurity (03/08/2022)  Housing: Low Risk  (03/08/2022)  Transportation Needs: Unmet Transportation Needs (04/21/2022)  Utilities: Not At Risk (03/08/2022)  Alcohol Screen: Low Risk  (06/11/2020)  Depression (PHQ2-9): Medium Risk (06/11/2020)  Financial Resource Strain: Medium Risk (11/15/2020)  Tobacco Use: Low Risk  (10/24/2023)    Readmission Risk Interventions     No data to display

## 2023-10-29 ENCOUNTER — Inpatient Hospital Stay (HOSPITAL_COMMUNITY)

## 2023-10-29 DIAGNOSIS — S42291A Other displaced fracture of upper end of right humerus, initial encounter for closed fracture: Secondary | ICD-10-CM | POA: Diagnosis not present

## 2023-10-29 DIAGNOSIS — S81811A Laceration without foreign body, right lower leg, initial encounter: Secondary | ICD-10-CM | POA: Diagnosis not present

## 2023-10-29 DIAGNOSIS — I5032 Chronic diastolic (congestive) heart failure: Secondary | ICD-10-CM | POA: Diagnosis not present

## 2023-10-29 DIAGNOSIS — W19XXXA Unspecified fall, initial encounter: Secondary | ICD-10-CM | POA: Diagnosis not present

## 2023-10-29 LAB — BASIC METABOLIC PANEL WITH GFR
Anion gap: 9 (ref 5–15)
Anion gap: 9 (ref 5–15)
BUN: 15 mg/dL (ref 8–23)
BUN: 17 mg/dL (ref 8–23)
CO2: 33 mmol/L — ABNORMAL HIGH (ref 22–32)
CO2: 31 mmol/L (ref 22–32)
Calcium: 8.4 mg/dL — ABNORMAL LOW (ref 8.9–10.3)
Calcium: 8.5 mg/dL — ABNORMAL LOW (ref 8.9–10.3)
Chloride: 92 mmol/L — ABNORMAL LOW (ref 98–111)
Chloride: 95 mmol/L — ABNORMAL LOW (ref 98–111)
Creatinine, Ser: 0.85 mg/dL (ref 0.61–1.24)
Creatinine, Ser: 0.88 mg/dL (ref 0.61–1.24)
GFR, Estimated: >60 mL/min (ref >60)
GFR, Estimated: >60 mL/min (ref >60)
Glucose, Bld: 110 mg/dL — ABNORMAL HIGH (ref 70–99)
Glucose, Bld: 122 mg/dL — ABNORMAL HIGH (ref 70–99)
Potassium: 3.5 mmol/L (ref 3.5–5.1)
Potassium: 3.9 mmol/L (ref 3.5–5.1)
Sodium: 134 mmol/L — ABNORMAL LOW (ref 135–145)
Sodium: 135 mmol/L (ref 135–145)

## 2023-10-29 MED ORDER — FENTANYL CITRATE PF 50 MCG/ML IJ SOSY
50.0000 ug | PREFILLED_SYRINGE | INTRAMUSCULAR | Status: DC | PRN
  Administered 2023-10-29 – 2023-11-02 (×18): 50 ug via INTRAVENOUS
  Filled 2023-10-29 (×18): qty 1

## 2023-10-29 MED ORDER — OXYCODONE-ACETAMINOPHEN 5-325 MG PO TABS
2.0000 | ORAL_TABLET | ORAL | Status: DC | PRN
  Administered 2023-10-29 – 2023-11-02 (×15): 2 via ORAL
  Filled 2023-10-29 (×16): qty 2

## 2023-10-29 NOTE — Progress Notes (Addendum)
 Progress Note   Patient: Charles Gray FMW:[id number] DOB: [birth year] DOA: 10/24/2023     4 DOS: the patient was seen and examined on 10/29/2023   Brief hospital course: Charles Gray is a 68 y.o. male with medical history significant for venous insufficiency, type 2 diabetes, paroxysmal A-fib on Eliquis , chronic diastolic CHF, COPD, chronic pain syndrome on Suboxone , who presents to the ER after a mechanical fall at home.   Patient sustained comminuted displaced proximal right humerus fracture, was placed on a splint in ED.  He also had laceration of her left lower extremity, admitted to [facility] service for further management with orthopedic surgery consultation.  Assessment and Plan: Status post mechanical fall Comminuted and displaced proximal right humerus fracture Left lower extremity laceration injury- Orthopedic surgery suggested non operative conservative management for right humerus fracture and wound care for left leg injury. Continue pain control with IV fentanyl  for severe pain, oral percocet for moderate pain, continue right arm splint, wound care for left leg laceration.  Acute blood loss anemia- Patient's hemoglobin 7.8 s/p 1 unit PRBCs 10/26/23 Continue to closely monitor H&H and transfuse for hemoglobin less than 7.  Chronic diastolic CHF Patient is euvolemic.  Continue home dose torsemide , metolazone , Toprol -XL, Aldactone . Monitor daily renal function, electrolytes.  Paroxysmal A-fib Patient is currently on metoprolol  xl 25, Eliquis  therapy. Continue telemetry.  Persistent severe Hypokalemia- Due to diuresis. K 3.5 today. He is requiring 240meq kcl oral daily with 40 meq extra dose with metolazone  MWF schedule. Mag normal. Continue to monitor daily electrolytes.  Obesity with BMI 31.89 Diet, exercise and weight reduction advised.  Type 2 diabetes mellitus Diet controlled.   A1c 5.2. Blood sugar stable.      Out of bed to chair. Incentive spirometry. Nursing  supportive care. Fall, aspiration precautions. Diet:  Diet Orders (From admission, onward)     Start     Ordered   10/25/23 1100  Diet regular Room service appropriate? Yes; Fluid consistency: Thin  Diet effective now       Question Answer Comment  Room service appropriate? Yes   Fluid consistency: Thin      10/25/23 1059           DVT prophylaxis:  apixaban  (ELIQUIS ) tablet 5 mg  Level of care: Telemetry Surgical   Code Status: Full Code  Peer to peer done today- advised to reapply once pain better controlled and working with PT/ OT.  Subjective: Patient is seen and examined today morning. Complains of left shoulder pain, can't lift his arm above shoulder. Still no bowel movement does not want enema.   Physical Exam: Vitals:   10/28/23 1358 10/28/23 1936 10/29/23 0448 10/29/23 0723  BP: (!) 105/51 (!) 112/58 (!) 103/55 (!) 102/52  Pulse: 84 76 65 70  Resp:  16 17 18   Temp: 98.6 F (37 C) 98.4 F (36.9 C) 99.3 F (37.4 C) 98.4 F (36.9 C)  TempSrc: Oral Oral Oral Oral  SpO2: 97% 98% 99% 99%  Weight:      Height:        General - Elderly obese Caucasian male, distress due to pain HEENT - PERRLA, EOMI, atraumatic head, non tender sinuses. Lung - Clear, basal rales, rhonchi, no wheezes. Heart - S1, S2 heard, no murmurs, rubs, 3+ pitting pedal edema. Abdomen - Soft, non tender, obese, bowel sounds good Neuro - Alert, awake and oriented x 3, non focal exam. Skin - Warm and dry.  Chronic lower extremity skin changes, left lower extremity  dressing, right arm sling noted  Data Reviewed:      Latest Ref Rng & Units 10/28/2023    6:08 AM 10/27/2023    5:37 AM 10/26/2023    8:13 PM  CBC  WBC 4.0 - 10.5 K/uL 7.3  7.2    Hemoglobin 13.0 - 17.0 g/dL 7.8  7.3  7.0   Hematocrit 39.0 - 52.0 % 27.0  25.6  24.1   Platelets 150 - 400 K/uL 134  120        Latest Ref Rng & Units 10/29/2023    5:09 AM 10/28/2023    5:48 PM 10/28/2023    6:08 AM  BMP  Glucose 70 - 99 mg/dL  889  869  882   BUN 8 - 23 mg/dL 15  14  14    Creatinine 0.61 - 1.24 mg/dL 9.14  8.97  9.18   Sodium 135 - 145 mmol/L 134  134  135   Potassium 3.5 - 5.1 mmol/L 3.5  2.9  2.6   Chloride 98 - 111 mmol/L 92  92  91   CO2 22 - 32 mmol/L 33  33  32   Calcium 8.9 - 10.3 mg/dL 8.4  8.3  8.1    No results found.   Family Communication: Discussed with patient, understand and agree. All questions answered.  Disposition: Status is: Inpatient Remains inpatient appropriate because: Pain control, wound care, electorlyte repletion. Auth for SNF to start on Monday.  Planned Discharge Destination: Skilled nursing facility     Time spent: 42 minutes  Author: Concepcion Riser, MD 10/29/2023 1:29 PM Secure chat 7am to 7pm For on call review [email or web address].

## 2023-10-29 NOTE — TOC Progression Note (Addendum)
 Transition of Care [facility]) - Progression Note    Patient Details  Name: Charles Gray MRN: [id number] Date of Birth: 1955/09/09  Transition of [facility]) CM/SW Contact  Gwenn Frieze [place], [place] Phone Number: 10/29/2023, 11:06 AM  Clinical Narrative: Call placed to [facility] requesting update re SNF auth. Informed their provider would like to offer a peer-to-peer with a deadline of 2pm today. MD provided with contact information for peer-to-peer.   UPDATE 1320: Per MD, pt denied for SNF after peer-to-peer as insurance does think pt medically ready. Will resubmit auth for [facility] closer to dc.   Frieze Gwenn, MSW, LCSW [phone] (coverage)        Expected Discharge Plan: Skilled Nursing Facility Barriers to Discharge: SNF Pending bed offer, Continued Medical Work up  Expected Discharge Plan and Services In-house Referral: Clinical Social Work   Post Acute Care Choice: Skilled Nursing Facility Living arrangements for the past 2 months: Single Family Home                                       Social Determinants of Health (SDOH) Interventions SDOH Screenings   Food Insecurity: No Food Insecurity (03/08/2022)  Housing: Low Risk  (03/08/2022)  Transportation Needs: Unmet Transportation Needs (04/21/2022)  Utilities: Not At Risk (03/08/2022)  Alcohol Screen: Low Risk  (06/11/2020)  Depression (PHQ2-9): Medium Risk (06/11/2020)  Financial Resource Strain: Medium Risk (11/15/2020)  Tobacco Use: Low Risk  (10/24/2023)    Readmission Risk Interventions     No data to display

## 2023-10-30 DIAGNOSIS — I5032 Chronic diastolic (congestive) heart failure: Secondary | ICD-10-CM | POA: Diagnosis not present

## 2023-10-30 DIAGNOSIS — W19XXXA Unspecified fall, initial encounter: Secondary | ICD-10-CM | POA: Diagnosis not present

## 2023-10-30 DIAGNOSIS — S81811A Laceration without foreign body, right lower leg, initial encounter: Secondary | ICD-10-CM | POA: Diagnosis not present

## 2023-10-30 DIAGNOSIS — S42291A Other displaced fracture of upper end of right humerus, initial encounter for closed fracture: Secondary | ICD-10-CM | POA: Diagnosis not present

## 2023-10-30 LAB — BASIC METABOLIC PANEL WITH GFR
Anion gap: 8 (ref 5–15)
BUN: 18 mg/dL (ref 8–23)
CO2: 32 mmol/L (ref 22–32)
Calcium: 8.7 mg/dL — ABNORMAL LOW (ref 8.9–10.3)
Chloride: 97 mmol/L — ABNORMAL LOW (ref 98–111)
Creatinine, Ser: 0.9 mg/dL (ref 0.61–1.24)
GFR, Estimated: >60 mL/min (ref >60)
Glucose, Bld: 105 mg/dL — ABNORMAL HIGH (ref 70–99)
Potassium: 3.5 mmol/L (ref 3.5–5.1)
Sodium: 137 mmol/L (ref 135–145)

## 2023-10-30 LAB — CBC
HCT: 26.4 % — ABNORMAL LOW (ref 39.0–52.0)
Hemoglobin: 7.6 g/dL — ABNORMAL LOW (ref 13.0–17.0)
MCH: 20.9 pg — ABNORMAL LOW (ref 26.0–34.0)
MCHC: 28.8 g/dL — ABNORMAL LOW (ref 30.0–36.0)
MCV: 72.5 fL — ABNORMAL LOW (ref 80.0–100.0)
Platelets: 137 K/uL — ABNORMAL LOW (ref 150–400)
RBC: 3.64 MIL/uL — ABNORMAL LOW (ref 4.22–5.81)
RDW: 20.8 % — ABNORMAL HIGH (ref 11.5–15.5)
WBC: 5.9 K/uL (ref 4.0–10.5)
nRBC: 0 % (ref 0.0–0.2)

## 2023-10-30 MED ORDER — HYDROCERIN EX CREA
TOPICAL_CREAM | Freq: Two times a day (BID) | CUTANEOUS | Status: DC
  Filled 2023-10-30 (×2): qty 113

## 2023-10-30 NOTE — Progress Notes (Signed)
 Progress Note   Patient: Charles Gray FMW:[id number] DOB: 12-07-1955 DOA: 10/24/2023     5 DOS: the patient was seen and examined on 10/30/2023   Brief hospital course: Gibran Veselka is a 68 y.o. male with medical history significant for venous insufficiency, type 2 diabetes, paroxysmal A-fib on Eliquis , chronic diastolic CHF, COPD, chronic pain syndrome on Suboxone , who presents to the ER after a mechanical fall at home.   Patient sustained comminuted displaced proximal right humerus fracture, was placed on a splint in ED.  He also had laceration of her left lower extremity, admitted to [facility] service for further management with orthopedic surgery consultation.  Assessment and Plan: Status post mechanical fall Comminuted and displaced proximal right humerus fracture Left lower extremity laceration injury- Orthopedic surgery suggested non operative conservative management for right humerus fracture and wound care for left leg injury. Continue pain control with IV fentanyl  for severe pain, oral percocet for moderate pain, continue right arm splint, wound care consulted for left leg laceration.  Acute blood loss anemia- Patient's hemoglobin 7.6 s/p 1 unit PRBCs 10/26/23 Continue to closely monitor H&H and transfuse for hemoglobin less than 7.  Chronic systolic CHF Patient is euvolemic.  Continue home dose torsemide , metolazone , Toprol -XL, Aldactone . Monitor daily renal function, electrolytes.  Paroxysmal A-fib Patient is currently on metoprolol  xl 25, Eliquis  therapy. Continue telemetry.  Persistent severe Hypokalemia- Due to diuresis. K 3.5 today. He is requiring 240meq kcl oral daily with 40 meq extra dose with metolazone  MWF schedule. Mag normal. Continue to monitor daily electrolytes.  Obesity with BMI 31.89 Diet, exercise and weight reduction advised.  Type 2 diabetes mellitus Diet controlled.   A1c 5.2. Blood sugar stable.      Out of bed to chair. Incentive  spirometry. Nursing supportive care. Fall, aspiration precautions. Diet:  Diet Orders (From admission, onward)     Start     Ordered   10/25/23 1100  Diet regular Room service appropriate? Yes; Fluid consistency: Thin  Diet effective now       Question Answer Comment  Room service appropriate? Yes   Fluid consistency: Thin      10/25/23 1059           DVT prophylaxis:  apixaban  (ELIQUIS ) tablet 5 mg  Level of care: Telemetry Surgical   Code Status: Full Code  Peer to peer done today- advised to reapply once pain better controlled and working with PT/ OT.  Subjective: Patient is seen and examined today morning. Does have pain. Has constipation even with lactulose . Not getting out of bed.   Physical Exam: Vitals:   10/29/23 2002 10/30/23 0115 10/30/23 0524 10/30/23 0802  BP: 111/62 98/60 (!) 103/54 (!) 92/48  Pulse: 86 80 76 82  Resp: 20 20 18 16   Temp: 99.8 F (37.7 C) 97.6 F (36.4 C) 98.1 F (36.7 C) 98.2 F (36.8 C)  TempSrc: Oral Oral Oral Oral  SpO2: 97% 99% 100% 100%  Weight:      Height:        General - Elderly obese Caucasian ill male, distress due to pain HEENT - PERRLA, EOMI, atraumatic head, non tender sinuses. Lung - Clear, basal rales, rhonchi, no wheezes. Heart - S1, S2 heard, no murmurs, rubs, 3+ pitting pedal edema. Abdomen - Soft, non tender, obese, bowel sounds good Neuro - Alert, awake and oriented x 3, non focal exam. Skin - Warm and dry.  Chronic lower extremity skin changes, left lower extremity dressing, right arm sling noted  Data Reviewed:      Latest Ref Rng & Units 10/30/2023    4:35 AM 10/28/2023    6:08 AM 10/27/2023    5:37 AM  CBC  WBC 4.0 - 10.5 K/uL 5.9  7.3  7.2   Hemoglobin 13.0 - 17.0 g/dL 7.6  7.8  7.3   Hematocrit 39.0 - 52.0 % 26.4  27.0  25.6   Platelets 150 - 400 K/uL 137  134  120       Latest Ref Rng & Units 10/30/2023    4:35 AM 10/29/2023    4:51 PM 10/29/2023    5:09 AM  BMP  Glucose 70 - 99 mg/dL 894   877  889   BUN 8 - 23 mg/dL 18  17  15    Creatinine 0.61 - 1.24 mg/dL 9.09  9.11  9.14   Sodium 135 - 145 mmol/L 137  135  134   Potassium 3.5 - 5.1 mmol/L 3.5  3.9  3.5   Chloride 98 - 111 mmol/L 97  95  92   CO2 22 - 32 mmol/L 32  31  33   Calcium 8.9 - 10.3 mg/dL 8.7  8.5  8.4    DG Shoulder Left Result Date: 10/29/2023 CLINICAL DATA:  Left shoulder pain. Mobility impaired. Status post fall last week. EXAM: LEFT SHOULDER - 2+ VIEW COMPARISON:  None Available. FINDINGS: No evidence of acute fracture. Superior subluxation of the humeral head abutting the undersurface of the acromion, typical of chronic rotator cuff arthropathy. Mild subcortical cystic change in the lateral humeral head. Mild to moderate underlying glenohumeral and acromioclavicular osteoarthritis. No evidence of erosion or focal bone abnormality. IMPRESSION: 1. No acute fracture or dislocation of the left shoulder. 2. Superior subluxation of the humeral head abutting the undersurface of the acromion, typical of chronic rotator cuff arthropathy. 3. Mild to moderate glenohumeral and acromioclavicular osteoarthritis. Electronically Signed   By: Andrea Gasman M.D.   On: 10/29/2023 13:49     Family Communication: Discussed with patient, understand and agree. All questions answered.  Disposition: Status is: Inpatient Remains inpatient appropriate because: Pain control, wound care, electorlyte repletion. Auth for SNF to start on Monday.  Planned Discharge Destination: Skilled nursing facility     Time spent: 43 minutes  Author: Concepcion Riser, MD 10/30/2023 2:30 PM Secure chat 7am to 7pm For on call review [email or web address].

## 2023-10-30 NOTE — Consult Note (Addendum)
 WOC Nurse Consult Note: patient had fall with resultant laceration/skin tear to L lower leg; orthopedics consulted on this 10/25/2023 and wrote for Bacitracin  to wound every other day  Reason for Consult:  left leg wound  Wound type: full thickness r/t trauma  Pressure Injury POA: NA  Measurement: see nursing flowsheet  Wound bed: red moist with dark hemorrhagic/ecchymotic tissue surrounding  Drainage (amount, consistency, odor) appears serosanguinous  Periwound: ecchymosis  Also noted to have venous insufficiency with dry scaly hyperkeratotic skin to both lower legs  Dressing procedure/placement/frequency: Cleanse L lower leg wound with NS, apply Bacitracin  ointment to open wound bed daily and cover with Telfa nonstick dressing.  Apply xeroform gauze Soila [phone]) to surrounding dark purple/black tissue.  Cover entire area with ABD pad and secure with Kerlix roll gauze.    Will write for Eucerin to intact hyperkeratotic/dry skin of lower legs.    POC discussed with bedside nurse.  WOc team will not follow. Re-consult if further needs arise.   Thank you,    Powell Bar MSN, RN-BC, [employer]

## 2023-10-30 NOTE — Plan of Care (Signed)
  Problem: Education: Goal: Knowledge of General Education information will improve Description: Including pain rating scale, medication(s)/side effects and non-pharmacologic comfort measures Outcome: Progressing   Problem: Activity: Goal: Risk for activity intolerance will decrease Outcome: Not Progressing   Problem: Nutrition: Goal: Adequate nutrition will be maintained Outcome: Progressing   Problem: Pain Managment: Goal: General experience of comfort will improve and/or be controlled Outcome: Progressing

## 2023-10-31 DIAGNOSIS — I5032 Chronic diastolic (congestive) heart failure: Secondary | ICD-10-CM | POA: Diagnosis not present

## 2023-10-31 DIAGNOSIS — S42291A Other displaced fracture of upper end of right humerus, initial encounter for closed fracture: Secondary | ICD-10-CM | POA: Diagnosis not present

## 2023-10-31 DIAGNOSIS — W19XXXA Unspecified fall, initial encounter: Secondary | ICD-10-CM | POA: Diagnosis not present

## 2023-10-31 DIAGNOSIS — S81811A Laceration without foreign body, right lower leg, initial encounter: Secondary | ICD-10-CM | POA: Diagnosis not present

## 2023-10-31 LAB — BASIC METABOLIC PANEL WITH GFR
Anion gap: 11 (ref 5–15)
BUN: 19 mg/dL (ref 8–23)
CO2: 28 mmol/L (ref 22–32)
Calcium: 8.7 mg/dL — ABNORMAL LOW (ref 8.9–10.3)
Chloride: 96 mmol/L — ABNORMAL LOW (ref 98–111)
Creatinine, Ser: 0.96 mg/dL (ref 0.61–1.24)
GFR, Estimated: >60 mL/min (ref >60)
Glucose, Bld: 112 mg/dL — ABNORMAL HIGH (ref 70–99)
Potassium: 3.7 mmol/L (ref 3.5–5.1)
Sodium: 135 mmol/L (ref 135–145)

## 2023-10-31 NOTE — Progress Notes (Signed)
 Progress Note   Patient: Charles Gray FMW:[id number] DOB: [birth year] DOA: 10/24/2023     6 DOS: the patient was seen and examined on 10/31/2023   Brief hospital course: Charles Gray is a 68 y.o. male with medical history significant for venous insufficiency, type 2 diabetes, paroxysmal A-fib on Eliquis , chronic diastolic CHF, COPD, chronic pain syndrome on Suboxone , who presents to the ER after a mechanical fall at home.   Patient sustained comminuted displaced proximal right humerus fracture, was placed on a splint in ED.  He also had laceration of her left lower extremity, admitted to [facility] service for further management with orthopedic surgery consultation.  Assessment and Plan: Status post mechanical fall Comminuted and displaced proximal right humerus fracture Left lower extremity laceration injury- Orthopedic surgery suggested non operative conservative management for right humerus fracture and wound care for left leg injury. Continue pain control with IV fentanyl  for severe pain, oral percocet for moderate pain, continue right arm splint, wound care on board for left leg laceration dressing changes.  Acute blood loss anemia- Patient's hemoglobin >7 s/p 1 unit PRBCs 10/26/23 Continue to closely monitor H&H and transfuse for hemoglobin less than 7.  Chronic systolic CHF Patient is euvolemic.  Continue home dose torsemide , metolazone , Toprol -XL, Aldactone . Monitor daily renal function, electrolytes.  Paroxysmal A-fib Patient is currently on metoprolol  xl 25, Eliquis  therapy. Continue telemetry.  Persistent severe Hypokalemia- Due to diuresis. K 3.5 today. He is requiring 240meq kcl oral daily with 40 meq extra dose with metolazone  MWF schedule. Mag normal. Continue to monitor daily electrolytes.  Obesity with BMI 31.89 Diet, exercise and weight reduction advised.  Type 2 diabetes mellitus Diet controlled.   A1c 5.2. Blood sugar stable.      Out of bed to chair.  Incentive spirometry. Nursing supportive care. Fall, aspiration precautions. Diet:  Diet Orders (From admission, onward)     Start     Ordered   10/25/23 1100  Diet regular Room service appropriate? Yes; Fluid consistency: Thin  Diet effective now       Question Answer Comment  Room service appropriate? Yes   Fluid consistency: Thin      10/25/23 1059           DVT prophylaxis:  apixaban  (ELIQUIS ) tablet 5 mg  Level of care: Telemetry Surgical   Code Status: Full Code  Peer to peer done today- advised to reapply once pain better controlled and working with PT/ OT.  Subjective: Patient is seen and examined today morning. Had good bowel movement. Poor energy. 4 people assist to get out of bed.   Physical Exam: Vitals:   10/30/23 2144 10/31/23 0239 10/31/23 0242 10/31/23 0645  BP: (!) 107/56 (!) 93/56 108/62 (!) 97/55  Pulse: 87 83  74  Resp:  18  18  Temp:    99.3 F (37.4 C)  TempSrc:    Oral  SpO2:  99%  100%  Weight:      Height:        General - Elderly obese Caucasian ill male, mild distress due to pain HEENT - PERRLA, EOMI, atraumatic head, non tender sinuses. Lung - Clear, basal rales, rhonchi, no wheezes. Heart - S1, S2 heard, no murmurs, rubs, 3+ pitting pedal edema. Abdomen - Soft, non tender, obese, bowel sounds good Neuro - Alert, awake and oriented x 3, non focal exam. Skin - Warm and dry.  Chronic lower extremity skin changes b/l lower extremity chronic edema, skin changes, dressing, right arm bruise, sling  noted  Data Reviewed:      Latest Ref Rng & Units 10/30/2023    4:35 AM 10/28/2023    6:08 AM 10/27/2023    5:37 AM  CBC  WBC 4.0 - 10.5 K/uL 5.9  7.3  7.2   Hemoglobin 13.0 - 17.0 g/dL 7.6  7.8  7.3   Hematocrit 39.0 - 52.0 % 26.4  27.0  25.6   Platelets 150 - 400 K/uL 137  134  120       Latest Ref Rng & Units 10/31/2023    7:37 AM 10/30/2023    4:35 AM 10/29/2023    4:51 PM  BMP  Glucose 70 - 99 mg/dL 887  894  877   BUN 8 - 23 mg/dL  19  18  17    Creatinine 0.61 - 1.24 mg/dL 9.03  9.09  9.11   Sodium 135 - 145 mmol/L 135  137  135   Potassium 3.5 - 5.1 mmol/L 3.7  3.5  3.9   Chloride 98 - 111 mmol/L 96  97  95   CO2 22 - 32 mmol/L 28  32  31   Calcium 8.9 - 10.3 mg/dL 8.7  8.7  8.5    DG Shoulder Left Result Date: 10/29/2023 CLINICAL DATA:  Left shoulder pain. Mobility impaired. Status post fall last week. EXAM: LEFT SHOULDER - 2+ VIEW COMPARISON:  None Available. FINDINGS: No evidence of acute fracture. Superior subluxation of the humeral head abutting the undersurface of the acromion, typical of chronic rotator cuff arthropathy. Mild subcortical cystic change in the lateral humeral head. Mild to moderate underlying glenohumeral and acromioclavicular osteoarthritis. No evidence of erosion or focal bone abnormality. IMPRESSION: 1. No acute fracture or dislocation of the left shoulder. 2. Superior subluxation of the humeral head abutting the undersurface of the acromion, typical of chronic rotator cuff arthropathy. 3. Mild to moderate glenohumeral and acromioclavicular osteoarthritis. Electronically Signed   By: Andrea Gasman M.D.   On: 10/29/2023 13:49     Family Communication: Discussed with patient, understand and agree. All questions answered.  Disposition: Status is: Inpatient Remains inpatient appropriate because: Pain control, wound care, electorlyte repletion. Auth for SNF to start on Monday.  Planned Discharge Destination: Skilled nursing facility     Time spent: 41 minutes  Author: Concepcion Riser, MD 10/31/2023 12:08 PM Secure chat 7am to 7pm For on call review [email or web address].

## 2023-11-01 DIAGNOSIS — I5032 Chronic diastolic (congestive) heart failure: Secondary | ICD-10-CM | POA: Diagnosis not present

## 2023-11-01 DIAGNOSIS — S42291A Other displaced fracture of upper end of right humerus, initial encounter for closed fracture: Secondary | ICD-10-CM | POA: Diagnosis not present

## 2023-11-01 DIAGNOSIS — S81811A Laceration without foreign body, right lower leg, initial encounter: Secondary | ICD-10-CM | POA: Diagnosis not present

## 2023-11-01 DIAGNOSIS — W19XXXA Unspecified fall, initial encounter: Secondary | ICD-10-CM | POA: Diagnosis not present

## 2023-11-01 LAB — BASIC METABOLIC PANEL WITH GFR
Anion gap: 10 (ref 5–15)
BUN: 18 mg/dL (ref 8–23)
CO2: 33 mmol/L — ABNORMAL HIGH (ref 22–32)
Calcium: 8.8 mg/dL — ABNORMAL LOW (ref 8.9–10.3)
Chloride: 95 mmol/L — ABNORMAL LOW (ref 98–111)
Creatinine, Ser: 0.91 mg/dL (ref 0.61–1.24)
GFR, Estimated: >60 mL/min (ref >60)
Glucose, Bld: 106 mg/dL — ABNORMAL HIGH (ref 70–99)
Potassium: 3.8 mmol/L (ref 3.5–5.1)
Sodium: 138 mmol/L (ref 135–145)

## 2023-11-01 LAB — CBC
HCT: 27.7 % — ABNORMAL LOW (ref 39.0–52.0)
Hemoglobin: 7.8 g/dL — ABNORMAL LOW (ref 13.0–17.0)
MCH: 20.8 pg — ABNORMAL LOW (ref 26.0–34.0)
MCHC: 28.2 g/dL — ABNORMAL LOW (ref 30.0–36.0)
MCV: 73.9 fL — ABNORMAL LOW (ref 80.0–100.0)
Platelets: 148 K/uL — ABNORMAL LOW (ref 150–400)
RBC: 3.75 MIL/uL — ABNORMAL LOW (ref 4.22–5.81)
RDW: 22 % — ABNORMAL HIGH (ref 11.5–15.5)
WBC: 5.2 K/uL (ref 4.0–10.5)
nRBC: 0 % (ref 0.0–0.2)

## 2023-11-01 MED ORDER — ZOLPIDEM TARTRATE 5 MG PO TABS
5.0000 mg | ORAL_TABLET | Freq: Once | ORAL | Status: AC
  Administered 2023-11-01: 5 mg via ORAL
  Filled 2023-11-01: qty 1

## 2023-11-01 MED ORDER — ZOLPIDEM TARTRATE 5 MG PO TABS
5.0000 mg | ORAL_TABLET | Freq: Every evening | ORAL | Status: DC | PRN
  Administered 2023-11-01: 5 mg via ORAL
  Filled 2023-11-01 (×2): qty 1

## 2023-11-01 NOTE — Progress Notes (Addendum)
 Progress Note   Patient: Charles Gray FMW:[id number] DOB: May 21, 1955 DOA: 10/24/2023     7 DOS: the patient was seen and examined on 11/01/2023   Brief hospital course: [name] is a 68 y.o. male with medical history significant for venous insufficiency, type 2 diabetes, paroxysmal A-fib on Eliquis , chronic diastolic CHF, COPD, chronic pain syndrome on Suboxone , who presents to the ER after a mechanical fall at home.   Patient sustained comminuted displaced proximal right humerus fracture, was placed on a splint in ED.  WOC consulted for laceration of her left lower extremity, admitted to [facility] service for further management with orthopedic surgery consultation. Ortho advised non surgical management as he is poor surgical candidate. Continue pain control wound care. He got 1 unit PRBC transfused. He is awaiting insurance auth for SNF.  Assessment and Plan: Status post mechanical fall Comminuted and displaced proximal right humerus fracture Left lower extremity laceration injury- Orthopedic surgery suggested non operative conservative management for right humerus fracture and wound care for left leg injury. Continue pain control with IV fentanyl  for severe pain, oral percocet for moderate pain, continue right arm splint, wound care on board for left leg laceration dressing changes. PT/ OT re eval for SNF auth.  Acute blood loss anemia Acute on chronic anemia Iron  deficiency- Patient's hemoglobin >7 s/p 1 unit PRBCs 10/26/23 Continue to closely monitor H&H and transfuse for hemoglobin less than 7. Wound bleeding noted. Hb remained stable. Continue iron  supplements, he did receive IV iron  one time dose per pharmacy.  Chronic diastolic CHF- Patient is euvolemic.  Continue home dose torsemide , metolazone , Toprol -XL, Aldactone . Monitor daily renal function, electrolytes.  Paroxysmal A-fib Patient is currently on metoprolol  xl 25, Eliquis  therapy. Continue telemetry.  Persistent severe  Hypokalemia- Due to diuresis. K > 3.5 now. He is requiring 240meq kcl oral daily with 40 meq extra dose with metolazone  MWF schedule. Mag normal. Continue to monitor daily electrolytes.  Obesity with BMI 31.89 Diet, exercise and weight reduction advised.  Type 2 diabetes mellitus Diet controlled.   A1c 5.2. Blood sugar stable.      Out of bed to chair. Incentive spirometry. Nursing supportive care. Fall, aspiration precautions. Diet:  Diet Orders (From admission, onward)     Start     Ordered   10/25/23 1100  Diet regular Room service appropriate? Yes; Fluid consistency: Thin  Diet effective now       Question Answer Comment  Room service appropriate? Yes   Fluid consistency: Thin      10/25/23 1059           DVT prophylaxis:  apixaban  (ELIQUIS ) tablet 5 mg  Level of care: Telemetry Surgical   Code Status: Full Code  Peer to peer done 7/18- advised to reapply once pain better controlled and re-eval with PT/ OT.  Subjective: Patient is seen and examined today morning. Feels better today, pain improved. Wishes to work with PT. Bleeding over left leg noted.  Physical Exam: Vitals:   11/01/23 0037 11/01/23 0533 11/01/23 0731 11/01/23 1426  BP: 102/62 119/60 (!) 94/51 (!) 107/51  Pulse:  79 82 78  Resp: 18  14 16   Temp: 97.6 F (36.4 C) 97.9 F (36.6 C) 97.8 F (36.6 C) 97.8 F (36.6 C)  TempSrc: Oral Oral Oral Oral  SpO2: 100% 99% 100% 98%  Weight:      Height:        General - Elderly obese Caucasian ill male, mild distress due to pain HEENT -  PERRLA, EOMI, atraumatic head, non tender sinuses. Lung - Clear, basal rales, rhonchi, no wheezes. Heart - S1, S2 heard, no murmurs, rubs, 3+ pitting pedal edema. Abdomen - Soft, non tender, obese, bowel sounds good Neuro - Alert, awake and oriented x 3, non focal exam. Skin - Warm and dry.  Chronic lower extremity skin changes b/l lower extremity chronic edema, skin changes, dressing, right arm bruise, sling  noted  Data Reviewed:      Latest Ref Rng & Units 11/01/2023    6:45 AM 10/30/2023    4:35 AM 10/28/2023    6:08 AM  CBC  WBC 4.0 - 10.5 K/uL 5.2  5.9  7.3   Hemoglobin 13.0 - 17.0 g/dL 7.8  7.6  7.8   Hematocrit 39.0 - 52.0 % 27.7  26.4  27.0   Platelets 150 - 400 K/uL 148  137  134       Latest Ref Rng & Units 11/01/2023    6:45 AM 10/31/2023    7:37 AM 10/30/2023    4:35 AM  BMP  Glucose 70 - 99 mg/dL 893  887  894   BUN 8 - 23 mg/dL 18  19  18    Creatinine 0.61 - 1.24 mg/dL 9.08  9.03  9.09   Sodium 135 - 145 mmol/L 138  135  137   Potassium 3.5 - 5.1 mmol/L 3.8  3.7  3.5   Chloride 98 - 111 mmol/L 95  96  97   CO2 22 - 32 mmol/L 33  28  32   Calcium 8.9 - 10.3 mg/dL 8.8  8.7  8.7    No results found.    Family Communication: Discussed with patient, understand and agree. All questions answered.  Disposition: Status is: Inpatient Remains inpatient appropriate because: Pain control, wound care, electorlyte repletion. Peer to peer advised reapply for auth on Monday with PT/ OT re eval.  Planned Discharge Destination: Skilled nursing facility     Time spent: 44 minutes  Author: Concepcion Riser, MD 11/01/2023 4:36 PM Secure chat 7am to 7pm For on call review [email or web address].

## 2023-11-01 NOTE — Progress Notes (Signed)
 Charles Gray refuses to turn in bed he only wants to be supine, he was educated by this Char[employer] fundraiser and NA throughout the day.

## 2023-11-02 DIAGNOSIS — S42291A Other displaced fracture of upper end of right humerus, initial encounter for closed fracture: Secondary | ICD-10-CM | POA: Diagnosis not present

## 2023-11-02 DIAGNOSIS — W19XXXA Unspecified fall, initial encounter: Secondary | ICD-10-CM | POA: Diagnosis not present

## 2023-11-02 LAB — BASIC METABOLIC PANEL WITH GFR
Anion gap: 10 (ref 5–15)
BUN: 17 mg/dL (ref 8–23)
CO2: 30 mmol/L (ref 22–32)
Calcium: 8.8 mg/dL — ABNORMAL LOW (ref 8.9–10.3)
Chloride: 96 mmol/L — ABNORMAL LOW (ref 98–111)
Creatinine, Ser: 0.94 mg/dL (ref 0.61–1.24)
GFR, Estimated: >60 mL/min (ref >60)
Glucose, Bld: 96 mg/dL (ref 70–99)
Potassium: 3.6 mmol/L (ref 3.5–5.1)
Sodium: 136 mmol/L (ref 135–145)

## 2023-11-02 LAB — CBC
HCT: 27.9 % — ABNORMAL LOW (ref 39.0–52.0)
Hemoglobin: 7.9 g/dL — ABNORMAL LOW (ref 13.0–17.0)
MCH: 21 pg — ABNORMAL LOW (ref 26.0–34.0)
MCHC: 28.3 g/dL — ABNORMAL LOW (ref 30.0–36.0)
MCV: 74.2 fL — ABNORMAL LOW (ref 80.0–100.0)
Platelets: 149 K/uL — ABNORMAL LOW (ref 150–400)
RBC: 3.76 MIL/uL — ABNORMAL LOW (ref 4.22–5.81)
RDW: 22.1 % — ABNORMAL HIGH (ref 11.5–15.5)
WBC: 5.2 K/uL (ref 4.0–10.5)
nRBC: 0 % (ref 0.0–0.2)

## 2023-11-02 MED ORDER — FENTANYL CITRATE PF 50 MCG/ML IJ SOSY
12.5000 ug | PREFILLED_SYRINGE | INTRAMUSCULAR | Status: DC | PRN
  Administered 2023-11-02 – 2023-11-04 (×5): 12.5 ug via INTRAVENOUS
  Filled 2023-11-02 (×5): qty 1

## 2023-11-02 MED ORDER — OXYCODONE HCL 5 MG PO TABS
15.0000 mg | ORAL_TABLET | Freq: Four times a day (QID) | ORAL | Status: DC | PRN
  Administered 2023-11-02 – 2023-11-04 (×6): 15 mg via ORAL
  Filled 2023-11-02 (×7): qty 3

## 2023-11-02 MED ORDER — FENTANYL CITRATE PF 50 MCG/ML IJ SOSY: 12.5000 ug | PREFILLED_SYRINGE | INTRAMUSCULAR | Status: DC | PRN

## 2023-11-02 NOTE — Progress Notes (Signed)
 Occupational Therapy Treatment Patient Details Name: Charles Gray MRN: [id number] DOB: May 01, 1955 Today's Date: 11/02/2023   History of present illness Pt is a 68 y.o. M presenting to [facility] on 10/24/23 after a fall at home using his walker. IMG shows comminuted and displaced R proximal humerus fx and LLE laceration; plan for nonoperative management. PMH: CHF, A. fib, hypertension, hyperlipidemia, OSA, fatty liver disease   OT comments  Pt in bed upon therapy arrival and agreeable to participate in therapy session. OT and PT co-tx completed to progress mobility. Pt able to demonstrate improvement with bed mobility and transitioning to sitting on EOB. Stedy initially used to assist with standing although pt verbalized dislike and requested to use no assistive device. Pt did demonstrate improvement with using 2 person face to face technique. Difficulty finding center of gravity as pt demonstrated an aggregated forward flexed posture stating that he would fall if he stood straight up. With increased time, pt was able to step pivot to recliner. Maximove sling placed underneath him for nursing to transfer back to bed when ready. Patient will benefit from continued inpatient follow up therapy, <3 hours/day.       If plan is discharge home, recommend the following:  Two people to help with walking and/or transfers;Two people to help with bathing/dressing/bathroom;Help with stairs or ramp for entrance;Assist for transportation;Assistance with cooking/housework   Equipment Recommendations  Other (comment) (defer to next venue of care)       Precautions / Restrictions Precautions Precautions: Fall;Shoulder Type of Shoulder Precautions: R humerus fx - non op management. OK wrist and elbow ROM Shoulder Interventions: Shoulder sling/immobilizer;At all times;Off for dressing/bathing/exercises Recall of Precautions/Restrictions: Intact Restrictions Weight Bearing Restrictions Per Provider Order: Yes RUE  Weight Bearing Per Provider Order: Non weight bearing       Mobility Bed Mobility Overal bed mobility: Needs Assistance Bed Mobility: Supine to Sit     Supine to sit: Contact guard, HOB elevated, Used rails     General bed mobility comments: Pt able to utilize bed rail with HOB elevated and transition from supine to sitting up on EOB. Pt transitioned to left side of bed.    Transfers Overall transfer level: Needs assistance Equipment used: Ambulation equipment used, None Transfers: Sit to/from Stand, Bed to chair/wheelchair/BSC Sit to Stand: Max assist, Min assist, +2 safety/equipment, +2 physical assistance, From elevated surface     Step pivot transfers: Min assist, +2 safety/equipment, +2 physical assistance, From elevated surface     General transfer comment: Initially utilized Stedy with pillow to provide BLE padding and bath blanket to pad horizontal bar. Bed pad used to assist with power up. VC to lean forward, engage BLE and squeeze glutes to stand up. Pt required VC to not use RUE to hold horizontal bar. Required Max AX2 when using stedy. Per pt's request, no stedy was used to complete sit to stand and step pivot transfer. 2 person face to face transfer technique utilized with bed pad to complete. Pt able to power up while forward flexed at trunk. Required increased time to transfer from bed to recliner. Unable to control descent from standing to sitting.     Balance Overall balance assessment: Needs assistance Sitting-balance support: Single extremity supported, Feet supported Sitting balance-Leahy Scale: Good     Standing balance support: Single extremity supported, During functional activity Standing balance-Leahy Scale: Poor      ADL either performed or assessed with clinical judgement     Communication Communication Communication: No apparent difficulties  Cognition Arousal: Alert Behavior During Therapy: WFL for tasks assessed/performed Cognition: No  apparent impairments      Following commands: Intact        Cueing   Cueing Techniques: Verbal cues, Visual cues  Exercises Other Exercises Other Exercises: seated, A/ROM scapular retraction, shoulder rolls backward, cervical extension/flexion, thoracic rotation right/left, 1 set 5-10 times.       General Comments LE bandaging in place, noted dried blood on lateral aspect of bandage.    Pertinent Vitals/ Pain       Pain Assessment Pain Assessment: Faces Faces Pain Scale: Hurts worst Pain Location: R shoulder, L shoulder, L LE Pain Descriptors / Indicators: Guarding, Grimacing, Sharp, Constant, Aching, Sore Pain Intervention(s): Limited activity within patient's tolerance, Monitored during session, Repositioned, Patient requesting pain meds-RN notified         Frequency  Min 2X/week        Progress Toward Goals  OT Goals(current goals can now be found in the care plan section)  Progress towards OT goals: Progressing toward goals         Co-evaluation    PT/OT/SLP Co-Evaluation/Treatment: Yes Reason for Co-Treatment: To address functional/ADL transfers   OT goals addressed during session: Strengthening/ROM;Proper use of Adaptive equipment and DME;ADL's and self-care      AM-PAC OT 6 Clicks Daily Activity     Outcome Measure   Help from another person eating meals?: A Little Help from another person taking care of personal grooming?: A Lot Help from another person toileting, which includes using toliet, bedpan, or urinal?: Total Help from another person bathing (including washing, rinsing, drying)?: Total Help from another person to put on and taking off regular upper body clothing?: Total Help from another person to put on and taking off regular lower body clothing?: Total 6 Click Score: 9    End of Session Equipment Utilized During Treatment: Other (comment) (sling)  OT Visit Diagnosis: Other abnormalities of gait and mobility (R26.89);Unsteadiness on  feet (R26.81);Muscle weakness (generalized) (M62.81)   Activity Tolerance Patient limited by pain;Patient tolerated treatment well   Patient Left in chair;with call bell/phone within reach;with chair alarm set   Nurse Communication Mobility status;Patient requests pain meds        Time: 1410-1445 OT Time Calculation (min): 35 min  Charges: OT General Charges $OT Visit: 1 Visit OT Treatments $Therapeutic Activity: 8-22 mins  Leita Howell, OTR/L,CBIS  Supplemental OT - [facility] and [facility] Secure Chat Preferred    Malaia Buchta, Leita BIRCH 11/02/2023, 4:01 PM

## 2023-11-02 NOTE — Plan of Care (Signed)
  Problem: Education: Goal: Knowledge of General Education information will improve Description: Including pain rating scale, medication(s)/side effects and non-pharmacologic comfort measures Outcome: Progressing   Problem: Health Behavior/Discharge Planning: Goal: Ability to manage health-related needs will improve Outcome: Not Progressing   Problem: Clinical Measurements: Goal: Ability to maintain clinical measurements within normal limits will improve Outcome: Progressing Goal: Respiratory complications will improve Outcome: Progressing

## 2023-11-02 NOTE — Progress Notes (Signed)
 Physical Therapy Treatment Patient Details Name: Charles Gray MRN: [id number] DOB: 1955/08/06 Today's Date: 11/02/2023   History of Present Illness Pt is a 68 y.o. M presenting to [facility] on 10/24/23 after a fall at home using his walker. IMG shows comminuted and displaced R proximal humerus fx and LLE laceration; plan for nonoperative management. PMH: CHF, A. fib, hypertension, hyperlipidemia, OSA, fatty liver disease    PT Comments  Pt resting in bed on arrival, agreeable to PT/OT session, with pt giving good effort for transfer training. Pt seen in conjunction with OT to optimize pt activity tolerance and progress OOB mobility. Pt able to come to sitting EOB with light min A to scoot out to EOB and maintain sitting balance without assist. Pt able to come to stand x2 in stedy frame with mod A +2 and with 2 person HHA x1 with min A +2. On last stand pt able to take low shuffling steps around from EOB to chair with min A +2 to maintain balance, with verbal and tactile cues for upright posture as pt with very flexed posture as pt stating fear of falling if he elevated trunk. Maxi-move sling placed on chair under pt and RN aware for ease of back to bed as pt stating max fatigue with transfer. Patient will benefit from continued inpatient follow up therapy, <3 hours/day, will continue to follow acutely.    If plan is discharge home, recommend the following: A lot of help with walking and/or transfers;A lot of help with bathing/dressing/bathroom;Assistance with cooking/housework;Assist for transportation;Help with stairs or ramp for entrance   Can travel by private vehicle     No  Equipment Recommendations  Wheelchair (measurements PT);Wheelchair cushion (measurements PT);Hospital bed;Hoyer lift    Recommendations for Other Services       Precautions / Restrictions Precautions Precautions: Fall;Shoulder Type of Shoulder Precautions: R humerus fx - non op management. OK wrist and elbow  ROM Shoulder Interventions: Shoulder sling/immobilizer;At all times;Off for dressing/bathing/exercises Recall of Precautions/Restrictions: Intact Required Braces or Orthoses: Sling Restrictions Weight Bearing Restrictions Per Provider Order: Yes RUE Weight Bearing Per Provider Order: Non weight bearing     Mobility  Bed Mobility Overal bed mobility: Needs Assistance Bed Mobility: Supine to Sit     Supine to sit: HOB elevated, Used rails, Min assist     General bed mobility comments: Pt able to utilize bed rail with HOB elevated and transition from supine to sitting up on EOB. Pt transitioned to left side of bed. light min A with use of bed pad to scoot out    Transfers Overall transfer level: Needs assistance Equipment used: Ambulation equipment used, None Transfers: Sit to/from Stand, Bed to chair/wheelchair/BSC Sit to Stand: Max assist, Min assist, +2 safety/equipment, +2 physical assistance, From elevated surface   Step pivot transfers: Min assist, +2 safety/equipment, +2 physical assistance, From elevated surface       General transfer comment: Initially utilized Stedy with pillow to provide BLE padding and bath blanket to pad horizontal bar. Bed pad used to assist with power up. VC to lean forward, engage BLE and squeeze glutes to stand up. Pt required VC to not use RUE to hold horizontal bar. Required Max AX2 when using stedy. Per pt's request, no stedy was used to complete sit to stand and step pivot transfer. 2 person face to face transfer technique utilized with bed pad to complete. Pt able to power up while forward flexed at trunk. Required increased time to transfer from bed to  recliner. Unable to control descent from standing to sitting.    Ambulation/Gait                   Stairs             Wheelchair Mobility     Tilt Bed    Modified Rankin (Stroke Patients Only)       Balance Overall balance assessment: Needs assistance Sitting-balance  support: Single extremity supported, Feet supported Sitting balance-Leahy Scale: Good Sitting balance - Comments: seated EOB. Able to weight shift during OT session (right/left) without physical assist.   Standing balance support: Single extremity supported, During functional activity Standing balance-Leahy Scale: Poor Standing balance comment: mod A +2 to maintain balance                            Communication Communication Communication: No apparent difficulties  Cognition Arousal: Alert Behavior During Therapy: WFL for tasks assessed/performed   PT - Cognitive impairments: No apparent impairments                         Following commands: Intact      Cueing Cueing Techniques: Verbal cues, Visual cues  Exercises      General Comments General comments (skin integrity, edema, etc.): LE bandaging in place, noted dried blood on lateral aspect of bandage.      Pertinent Vitals/Pain Pain Assessment Pain Assessment: Faces Faces Pain Scale: Hurts worst Pain Location: R shoulder, L shoulder, L LE Pain Descriptors / Indicators: Guarding, Grimacing, Sharp, Constant, Aching, Sore Pain Intervention(s): Monitored during session, Limited activity within patient's tolerance, Repositioned    Home Living                          Prior Function            PT Goals (current goals can now be found in the care plan section) Acute Rehab PT Goals Patient Stated Goal: to get better PT Goal Formulation: With patient Time For Goal Achievement: 11/08/23 Progress towards PT goals: Progressing toward goals    Frequency    Min 2X/week      PT Plan      Co-evaluation   Reason for Co-Treatment: To address functional/ADL transfers   OT goals addressed during session: Strengthening/ROM;Proper use of Adaptive equipment and DME;ADL's and self-care      AM-PAC PT 6 Clicks Mobility   Outcome Measure  Help needed turning from your back to your  side while in a flat bed without using bedrails?: A Lot Help needed moving from lying on your back to sitting on the side of a flat bed without using bedrails?: A Lot Help needed moving to and from a bed to a chair (including a wheelchair)?: Total Help needed standing up from a chair using your arms (e.g., wheelchair or bedside chair)?: Total Help needed to walk in hospital room?: Total Help needed climbing 3-5 steps with a railing? : Total 6 Click Score: 8    End of Session   Activity Tolerance: Patient limited by pain Patient left: in chair;with call bell/phone within reach;Other (comment) (with maxi-move sling placed under pt) Nurse Communication: Mobility status;Need for lift equipment (maxi-move for back to bed) PT Visit Diagnosis: Muscle weakness (generalized) (M62.81);History of falling (Z91.81);Pain;Adult, failure to thrive (R62.7) Pain - Right/Left: Right Pain - part of body: Shoulder     Time:  8589-8551 PT Time Calculation (min) (ACUTE ONLY): 38 min  Charges:    $Therapeutic Activity: 23-37 mins PT General Charges $$ ACUTE PT VISIT: 1 Visit                     Diella Gillingham R. PTA Acute Rehabilitation Services Office: [phone]   Therisa CHRISTELLA Boor 11/02/2023, 4:32 PM

## 2023-11-02 NOTE — Plan of Care (Signed)
  Problem: Education: Goal: Knowledge of General Education information will improve Description: Including pain rating scale, medication(s)/side effects and non-pharmacologic comfort measures Outcome: Progressing   Problem: Clinical Measurements: Goal: Diagnostic test results will improve Outcome: Progressing Goal: Respiratory complications will improve Outcome: Progressing Goal: Cardiovascular complication will be avoided Outcome: Progressing

## 2023-11-02 NOTE — Progress Notes (Signed)
 [facility] ROUNDING NOTE  Abdulai Blaylock FMW:[id number]  DOB: 1956-03-12  DOA: 10/24/2023  PCP: Sabas Norleen PARAS., MD  11/02/2023,2:16 PM  LOS: 8 days    Code Status: Full code     from: Home current Dispo: Likely skilled   68 year old male HFpEF 69 EF 65% paroxysmal A-fib CHA2DS2-VASc >3 gouty arthritis Previous DVT PE--Cirrhosis without ascites, iron  deficiency anemia, hyperlipidemia anxiety, chronic Suboxone , OSA but nontolerant of CPAP  7/14 accidental fall while ambulating at home tripped on rug and hit right shoulder-workup revealed comminuted displaced proximal humerus fracture placed in a splint with laceration left lower extremity additionally 7/14 Dr. Genelle orthopedics outpatient recommended nonoperative management right shoulder and wound care to left lower extremity 7/15 transfusion 1 unit PRBC Discharge complicated by need for peer to peer  Plan  Accidental fall with right shoulder proximal humeral fracture Nonoperable per Dr. Genelle--- likely requires imaging on 7/29 if still remains in hospital otherwise outpatient follow-up for imaging Switch fentanl 12.5 q3 prn Add OXycodone  15 every 6 as needed without Tylenol  given underlying possible cirrhosis  Laceration left lower extremity Mainly bleeding-no purulence has underlying chronic edema Picture below   Acute blood loss anemia Transfused earlier during hospital stay monitor hemoglobin continue iron  325 daily, received IV iron      HFpEF chronic diastolic Continue metolazone  2.5 3 times weekly, metoprolol  XL 25 Aldactone  25 torsemide  100 twice daily Patient is net -23 L this admission Does not require telemetry patient seems quite stable and in sinus rhythm  Paroxysmal A-fib CHADVASC >4 Continues Eliquis  5 twice daily, metoprolol  as above Watch hemoglobin  Previous hypokalemia severe Resolved  Chronic pain previously follows with pain physician in [place] BMI 31 He has arthritis in the knee and has had chronic back  pain since living in [place] -he was on Roxicodone  30 mg in at home was on Suboxone  but did not help with pain I have encouraged him to consider methadone for pain control and this can be addressed holistically by attending physician going forward depending on his decision to use his   Data Reviewed:   Sodium 136 potassium 3.6 BUN/creatinine 17/0.9 WBC 5.2 hemoglobin 7.9 platelet 149  DVT prophylaxis: Eliquis   Status is: Inpatient Remains inpatient appropriate because:   Requires further inpatient management     Subjective: Awake coherent no distress looks fair feels well--we discussed the pain regimen and I have told him that fentanyl  is not a good long-term choice  Objective + exam Vitals:   11/01/23 1931 11/01/23 2323 11/02/23 0625 11/02/23 0755  BP: 110/60 109/67 (!) 103/50 (!) 81/44  Pulse: 88 78 70 83  Resp: 18 18 17 16   Temp: 97.8 F (36.6 C) 98 F (36.7 C) 98.4 F (36.9 C) 97.8 F (36.6 C)  TempSrc: Oral Oral Oral   SpO2: 99% 98% 99% 100%  Weight:      Height:       Filed Weights   10/24/23 1846  Weight: (!) 138.3 kg     Examination: Coherent awake alert flat affect EOMI NCAT CTAB no added sound thick neck Mallampati 4 Wound examined as above     Scheduled Meds:  apixaban   5 mg Oral BID   bacitracin    Topical Daily   buprenorphine -naloxone   1 tablet Sublingual Daily   ferrous sulfate   325 mg Oral Q breakfast   gabapentin   800 mg Oral TID   hydrocerin   Topical BID   lactulose   20 g Oral BID   metolazone   2.5 mg  Oral Once per day on Monday Wednesday Friday   metoprolol  succinate  25 mg Oral QHS   pantoprazole   40 mg Oral BID AC   potassium chloride   40 mEq Oral Q3H   potassium chloride   40 mEq Oral Q M,W,F   pravastatin   20 mg Oral QHS   sertraline   100 mg Oral QHS   spironolactone   25 mg Oral Daily   tamsulosin   0.4 mg Oral Daily   torsemide   100 mg Oral BID   Continuous Infusions:  Time 60  Jai-Gurmukh Tymeshia Awan, MD  [facility]

## 2023-11-03 DIAGNOSIS — W19XXXA Unspecified fall, initial encounter: Secondary | ICD-10-CM | POA: Diagnosis not present

## 2023-11-03 DIAGNOSIS — S42291A Other displaced fracture of upper end of right humerus, initial encounter for closed fracture: Secondary | ICD-10-CM | POA: Diagnosis not present

## 2023-11-03 LAB — CBC
HCT: 26.7 % — ABNORMAL LOW (ref 39.0–52.0)
Hemoglobin: 7.6 g/dL — ABNORMAL LOW (ref 13.0–17.0)
MCH: 21.1 pg — ABNORMAL LOW (ref 26.0–34.0)
MCHC: 28.5 g/dL — ABNORMAL LOW (ref 30.0–36.0)
MCV: 74.2 fL — ABNORMAL LOW (ref 80.0–100.0)
Platelets: 168 K/uL (ref 150–400)
RBC: 3.6 MIL/uL — ABNORMAL LOW (ref 4.22–5.81)
RDW: 22.2 % — ABNORMAL HIGH (ref 11.5–15.5)
WBC: 5.1 K/uL (ref 4.0–10.5)
nRBC: 0 % (ref 0.0–0.2)

## 2023-11-03 LAB — BASIC METABOLIC PANEL WITH GFR
Anion gap: 11 (ref 5–15)
BUN: 20 mg/dL (ref 8–23)
CO2: 29 mmol/L (ref 22–32)
Calcium: 8.7 mg/dL — ABNORMAL LOW (ref 8.9–10.3)
Chloride: 95 mmol/L — ABNORMAL LOW (ref 98–111)
Creatinine, Ser: 0.99 mg/dL (ref 0.61–1.24)
GFR, Estimated: >60 mL/min (ref >60)
Glucose, Bld: 140 mg/dL — ABNORMAL HIGH (ref 70–99)
Potassium: 3.4 mmol/L — ABNORMAL LOW (ref 3.5–5.1)
Sodium: 135 mmol/L (ref 135–145)

## 2023-11-03 MED ORDER — ZOLPIDEM TARTRATE 5 MG PO TABS
10.0000 mg | ORAL_TABLET | Freq: Every day | ORAL | Status: DC
  Administered 2023-11-03: 10 mg via ORAL
  Filled 2023-11-03: qty 2

## 2023-11-03 MED ORDER — METHOCARBAMOL 500 MG PO TABS
500.0000 mg | ORAL_TABLET | Freq: Three times a day (TID) | ORAL | Status: DC
  Administered 2023-11-03 – 2023-11-04 (×3): 500 mg via ORAL
  Filled 2023-11-03 (×3): qty 1

## 2023-11-03 MED ORDER — POTASSIUM CHLORIDE 20 MEQ PO PACK
80.0000 meq | PACK | Freq: Three times a day (TID) | ORAL | Status: DC
  Administered 2023-11-03 – 2023-11-04 (×3): 80 meq via ORAL
  Filled 2023-11-03 (×3): qty 4

## 2023-11-03 MED ORDER — EPLERENONE 25 MG PO TABS
50.0000 mg | ORAL_TABLET | Freq: Every day | ORAL | Status: DC
  Administered 2023-11-03 – 2023-11-04 (×2): 50 mg via ORAL
  Filled 2023-11-03 (×2): qty 2

## 2023-11-03 MED ORDER — BUPRENORPHINE HCL-NALOXONE HCL 8-2 MG SL SUBL: 1.0000 | SUBLINGUAL_TABLET | Freq: Two times a day (BID) | SUBLINGUAL | Status: DC

## 2023-11-03 MED ORDER — OXYCODONE HCL ER 10 MG PO T12A
20.0000 mg | EXTENDED_RELEASE_TABLET | Freq: Two times a day (BID) | ORAL | Status: DC
  Administered 2023-11-03 – 2023-11-04 (×3): 20 mg via ORAL
  Filled 2023-11-03 (×3): qty 2

## 2023-11-03 NOTE — Progress Notes (Signed)
 [facility] Progress Note Patient: Charles Gray FMW:[id number] DOB: April 04, 1956 DOA: 10/24/2023  DOS: the patient was seen and examined on 11/03/2023  Brief Hospital Course: Charles Gray is a 68 y.o. male with medical history significant for venous insufficiency, type 2 diabetes, paroxysmal A-fib on Eliquis , chronic diastolic CHF, COPD, chronic pain syndrome on Suboxone , who presents to the ER after a mechanical fall at home.   Patient sustained comminuted displaced proximal right humerus fracture, was placed on a splint in ED.  WOC consulted for laceration of her left lower extremity, admitted to [facility] service for further management with orthopedic surgery consultation. Ortho advised non surgical management as he is poor surgical candidate. Continue pain control wound care. He got 1 unit PRBC transfused. He is awaiting insurance auth for SNF.  Assessment and Plan: Status post mechanical fall Comminuted and displaced proximal right humerus fracture Left lower extremity laceration injury- Orthopedic surgery suggested non operative conservative management for right humerus fracture and wound care for left leg injury. Continue pain control with IV fentanyl  for refractory pain, oral percocet for moderate and severe pain, continue right arm splint, wound care on board for left leg laceration dressing changes. Add scheduled OxyContin . Patient already refusing to use Suboxone . PT/ OT re eval for SNF auth.   Acute blood loss anemia Acute on chronic anemia Iron  deficiency- Patient's hemoglobin >7 s/p 1 unit PRBCs 10/26/23 Continue to closely monitor H&H and transfuse for hemoglobin less than 7. Wound bleeding noted. Hb remained stable. Continue iron  supplements, he did receive IV iron  one time dose per pharmacy.   Chronic diastolic CHF- Patient is euvolemic.  Continue home dose torsemide , metolazone , Toprol -XL, Aldactone . Monitor daily renal function, electrolytes.   Paroxysmal  A-fib Patient is currently on metoprolol  xl 25, Eliquis  therapy. Continue telemetry.   Persistent severe Hypokalemia- Due to diuresis. K > 3.5 now. He is requiring 240meq kcl oral daily with 40 meq extra dose with metolazone  MWF schedule. Mag normal. Continue to monitor daily electrolytes.   Obesity with  Body mass index is 31.89 kg/m.  Diet, exercise and weight reduction advised.   Type 2 diabetes mellitus Diet controlled.   A1c 5.2. Blood sugar stable.   Subjective: No nausea vomiting fever no chills.  Does not want to take Suboxone .  Physical Exam: S1-S2 present. Clear to auscultation. Bowel sounds present.  Nontender. Significant lower extremity edema seen. Left leg wound is draped.  Data Reviewed: I have Reviewed nursing notes, Vitals, and Lab results. Since last encounter, pertinent lab results CBC and BMP   . I have ordered test including CBC and BMP  .  Discussed with pharmacy.  Disposition: Status is: Inpatient Remains inpatient appropriate because: Monitor for improvement in pain control. apixaban  (ELIQUIS ) tablet 5 mg   Family Communication: No one at bedside Level of care: Telemetry Surgical continue due to heart failure. Vitals:   11/02/23 1938 11/03/23 0442 11/03/23 0812 11/03/23 1500  BP: 115/66 (!) 105/58 (!) 104/56 (!) 115/59  Pulse: 86 85 71 85  Resp: 18 17 20 19   Temp: 98 F (36.7 C) 98.9 F (37.2 C) 98.2 F (36.8 C) 99.1 F (37.3 C)  TempSrc: Oral Oral Oral Oral  SpO2: 98% 96% 99% 99%  Weight:      Height:         Author: Yetta Blanch, MD 11/03/2023 7:01 PM  Please look on [email or web address] to find out who is on call.

## 2023-11-03 NOTE — Plan of Care (Signed)

## 2023-11-03 NOTE — Plan of Care (Signed)

## 2023-11-03 NOTE — Hospital Course (Signed)
 Charles Gray is a 68 y.o. male with medical history significant for venous insufficiency, type 2 diabetes, paroxysmal A-fib on Eliquis , chronic diastolic CHF, COPD, chronic pain syndrome on Suboxone , who presents to the ER after a mechanical fall at home.   Patient sustained comminuted displaced proximal right humerus fracture, was placed on a splint in ED.  WOC consulted for laceration of her left lower extremity, admitted to [facility] service for further management with orthopedic surgery consultation. Ortho advised non surgical management as he is poor surgical candidate. Continue pain control wound care. He got 1 unit PRBC transfused. He is awaiting insurance auth for SNF.  Assessment and Plan: Status post mechanical fall Comminuted and displaced proximal right humerus fracture Left lower extremity laceration injury- Orthopedic surgery suggested non operative conservative management for right humerus fracture and wound care for left leg injury. Continue pain control with IV fentanyl  for refractory pain, oral percocet for moderate and severe pain, continue right arm splint, wound care on board for left leg laceration dressing changes. Add scheduled OxyContin . Patient already refusing to use Suboxone . PT/ OT re eval for SNF auth.   Acute blood loss anemia Acute on chronic anemia Iron  deficiency- Patient's hemoglobin >7 s/p 1 unit PRBCs 10/26/23 Continue to closely monitor H&H and transfuse for hemoglobin less than 7. Wound bleeding noted. Hb remained stable. Continue iron  supplements, he did receive IV iron  one time dose per pharmacy.   Chronic diastolic CHF- Patient is euvolemic.  Continue home dose torsemide , metolazone , Toprol -XL, Aldactone . Monitor daily renal function, electrolytes.   Paroxysmal A-fib Patient is currently on metoprolol  xl 25, Eliquis  therapy. Continue telemetry.   Persistent severe Hypokalemia- Due to diuresis. K > 3.5 now. He is requiring 240meq kcl oral daily with  40 meq extra dose with metolazone  MWF schedule. Mag normal. Continue to monitor daily electrolytes.   Obesity with  Body mass index is 31.89 kg/m.  Diet, exercise and weight reduction advised.   Type 2 diabetes mellitus Diet controlled.   A1c 5.2. Blood sugar stable.

## 2023-11-03 NOTE — TOC Progression Note (Signed)
 Transition of Care [facility]) - Progression Note    Patient Details  Name: Charles Gray MRN: [id number] Date of Birth: 30-May-1955  [facility] CM/SW Contact  [name], [place] Phone Number: 11/03/2023, 10:52 AM  Clinical Narrative:   Per MD, pt will potentially be stable for DC tomorrow or Friday. CSW spoke with Tinnie at [facility] about restarting auth. [facility] is agreeable and able to accept patient when shara is approved and pt is medically stable. CSW notified patient.   11:08 AM CSW started authorization for SNF. Auth id [id number].   TOC will continue to follow.    Expected Discharge Plan: Skilled Nursing Facility Barriers to Discharge: SNF Pending bed offer, Continued Medical Work up               Expected Discharge Plan and Services In-house Referral: Clinical Social Work   Post Acute Care Choice: Skilled Nursing Facility Living arrangements for the past 2 months: Single Family Home                                       Social Drivers of Health (SDOH) Interventions SDOH Screenings   Food Insecurity: No Food Insecurity (11/02/2023)  Housing: Low Risk  (11/02/2023)  Transportation Needs: Unmet Transportation Needs (11/02/2023)  Utilities: Not At Risk (11/02/2023)  Alcohol Screen: Low Risk  (06/11/2020)  Depression (PHQ2-9): Medium Risk (06/11/2020)  Financial Resource Strain: Medium Risk (11/15/2020)  Tobacco Use: Low Risk  (10/24/2023)    Readmission Risk Interventions     No data to display

## 2023-11-04 DIAGNOSIS — F322 Major depressive disorder, single episode, severe without psychotic features: Secondary | ICD-10-CM | POA: Diagnosis not present

## 2023-11-04 DIAGNOSIS — D500 Iron deficiency anemia secondary to blood loss (chronic): Secondary | ICD-10-CM | POA: Diagnosis not present

## 2023-11-04 DIAGNOSIS — I5022 Chronic systolic (congestive) heart failure: Secondary | ICD-10-CM | POA: Diagnosis not present

## 2023-11-04 DIAGNOSIS — K7460 Unspecified cirrhosis of liver: Secondary | ICD-10-CM | POA: Diagnosis not present

## 2023-11-04 DIAGNOSIS — W19XXXD Unspecified fall, subsequent encounter: Secondary | ICD-10-CM | POA: Diagnosis not present

## 2023-11-04 DIAGNOSIS — E876 Hypokalemia: Secondary | ICD-10-CM | POA: Diagnosis not present

## 2023-11-04 DIAGNOSIS — K5903 Drug induced constipation: Secondary | ICD-10-CM | POA: Diagnosis not present

## 2023-11-04 DIAGNOSIS — Y9301 Activity, walking, marching and hiking: Secondary | ICD-10-CM | POA: Diagnosis not present

## 2023-11-04 DIAGNOSIS — S42291D Other displaced fracture of upper end of right humerus, subsequent encounter for fracture with routine healing: Secondary | ICD-10-CM | POA: Diagnosis not present

## 2023-11-04 DIAGNOSIS — F32A Depression, unspecified: Secondary | ICD-10-CM | POA: Diagnosis not present

## 2023-11-04 DIAGNOSIS — W19XXXA Unspecified fall, initial encounter: Secondary | ICD-10-CM | POA: Diagnosis not present

## 2023-11-04 DIAGNOSIS — I872 Venous insufficiency (chronic) (peripheral): Secondary | ICD-10-CM | POA: Diagnosis not present

## 2023-11-04 DIAGNOSIS — S81811D Laceration without foreign body, right lower leg, subsequent encounter: Secondary | ICD-10-CM | POA: Diagnosis not present

## 2023-11-04 DIAGNOSIS — E119 Type 2 diabetes mellitus without complications: Secondary | ICD-10-CM | POA: Diagnosis not present

## 2023-11-04 DIAGNOSIS — Y92009 Unspecified place in unspecified non-institutional (private) residence as the place of occurrence of the external cause: Secondary | ICD-10-CM | POA: Diagnosis not present

## 2023-11-04 DIAGNOSIS — R609 Edema, unspecified: Secondary | ICD-10-CM | POA: Diagnosis not present

## 2023-11-04 DIAGNOSIS — S42251D Displaced fracture of greater tuberosity of right humerus, subsequent encounter for fracture with routine healing: Secondary | ICD-10-CM | POA: Diagnosis not present

## 2023-11-04 DIAGNOSIS — K7581 Nonalcoholic steatohepatitis (NASH): Secondary | ICD-10-CM | POA: Diagnosis not present

## 2023-11-04 DIAGNOSIS — W010XXA Fall on same level from slipping, tripping and stumbling without subsequent striking against object, initial encounter: Secondary | ICD-10-CM | POA: Diagnosis not present

## 2023-11-04 DIAGNOSIS — J449 Chronic obstructive pulmonary disease, unspecified: Secondary | ICD-10-CM | POA: Diagnosis not present

## 2023-11-04 DIAGNOSIS — I480 Paroxysmal atrial fibrillation: Secondary | ICD-10-CM | POA: Diagnosis not present

## 2023-11-04 DIAGNOSIS — S42291A Other displaced fracture of upper end of right humerus, initial encounter for closed fracture: Secondary | ICD-10-CM | POA: Diagnosis not present

## 2023-11-04 DIAGNOSIS — M79662 Pain in left lower leg: Secondary | ICD-10-CM | POA: Diagnosis not present

## 2023-11-04 DIAGNOSIS — Z23 Encounter for immunization: Secondary | ICD-10-CM | POA: Diagnosis not present

## 2023-11-04 DIAGNOSIS — Z7401 Bed confinement status: Secondary | ICD-10-CM | POA: Diagnosis not present

## 2023-11-04 DIAGNOSIS — R161 Splenomegaly, not elsewhere classified: Secondary | ICD-10-CM | POA: Diagnosis not present

## 2023-11-04 DIAGNOSIS — G894 Chronic pain syndrome: Secondary | ICD-10-CM | POA: Diagnosis not present

## 2023-11-04 DIAGNOSIS — R531 Weakness: Secondary | ICD-10-CM | POA: Diagnosis not present

## 2023-11-04 LAB — CBC
HCT: 27.7 % — ABNORMAL LOW (ref 39.0–52.0)
Hemoglobin: 7.9 g/dL — ABNORMAL LOW (ref 13.0–17.0)
MCH: 21.2 pg — ABNORMAL LOW (ref 26.0–34.0)
MCHC: 28.5 g/dL — ABNORMAL LOW (ref 30.0–36.0)
MCV: 74.5 fL — ABNORMAL LOW (ref 80.0–100.0)
Platelets: 180 K/uL (ref 150–400)
RBC: 3.72 MIL/uL — ABNORMAL LOW (ref 4.22–5.81)
RDW: 22.3 % — ABNORMAL HIGH (ref 11.5–15.5)
WBC: 5.7 K/uL (ref 4.0–10.5)
nRBC: 0 % (ref 0.0–0.2)

## 2023-11-04 LAB — BASIC METABOLIC PANEL WITH GFR
Anion gap: 12 (ref 5–15)
BUN: 21 mg/dL (ref 8–23)
CO2: 30 mmol/L (ref 22–32)
Calcium: 9 mg/dL (ref 8.9–10.3)
Chloride: 96 mmol/L — ABNORMAL LOW (ref 98–111)
Creatinine, Ser: 1.02 mg/dL (ref 0.61–1.24)
GFR, Estimated: >60 mL/min (ref >60)
Glucose, Bld: 127 mg/dL — ABNORMAL HIGH (ref 70–99)
Potassium: 3.5 mmol/L (ref 3.5–5.1)
Sodium: 138 mmol/L (ref 135–145)

## 2023-11-04 LAB — MAGNESIUM: Magnesium: 1.8 mg/dL (ref 1.7–2.4)

## 2023-11-04 MED ORDER — ZOLPIDEM TARTRATE 10 MG PO TABS: 10.0000 mg | ORAL_TABLET | Freq: Every evening | ORAL | 0 refills | Status: AC | PRN

## 2023-11-04 MED ORDER — GABAPENTIN 800 MG PO TABS: 800.0000 mg | ORAL_TABLET | Freq: Three times a day (TID) | ORAL | 0 refills | Status: AC

## 2023-11-04 MED ORDER — OXYCODONE HCL ER 20 MG PO T12A: 20.0000 mg | EXTENDED_RELEASE_TABLET | Freq: Two times a day (BID) | ORAL | 0 refills | Status: AC

## 2023-11-04 MED ORDER — OXYCODONE HCL 5 MG PO TABS
15.0000 mg | ORAL_TABLET | ORAL | Status: DC | PRN
  Administered 2023-11-04: 15 mg via ORAL
  Filled 2023-11-04: qty 3

## 2023-11-04 MED ORDER — FERROUS SULFATE 325 (65 FE) MG PO TABS: 325.0000 mg | ORAL_TABLET | Freq: Every day | ORAL | 0 refills | Status: AC

## 2023-11-04 MED ORDER — OXYCODONE HCL 5 MG PO TABS: 10.0000 mg | ORAL_TABLET | ORAL | Status: DC | PRN

## 2023-11-04 MED ORDER — OXYCODONE HCL 15 MG PO TABS: 15.0000 mg | ORAL_TABLET | ORAL | 0 refills | Status: DC | PRN

## 2023-11-04 MED ORDER — BACITRACIN ZINC 500 UNIT/GM EX OINT: TOPICAL_OINTMENT | Freq: Every day | CUTANEOUS | 0 refills | Status: DC

## 2023-11-04 MED ORDER — HYDROCERIN EX CREA: 1.0000 | TOPICAL_CREAM | Freq: Two times a day (BID) | CUTANEOUS | Status: DC

## 2023-11-04 MED ORDER — METHOCARBAMOL 500 MG PO TABS: 500.0000 mg | ORAL_TABLET | Freq: Three times a day (TID) | ORAL | 0 refills | Status: AC

## 2023-11-04 NOTE — Progress Notes (Signed)
 Report called and given to Kaili, LPN at [facility]. All of nurse's questions answered to her satisfaction. Patient  transferred to [facility]. Left unit on stretcher pushed by [facility] staff. Left in stable condition. All medication scripts accompanied patient in envelope given to Caucasian male PTAR transporter.

## 2023-11-04 NOTE — Plan of Care (Signed)

## 2023-11-04 NOTE — Discharge Summary (Addendum)
 Physician Discharge Summary   Patient: Charles Gray MRN: [id number] DOB: 04-May-1955  Admit date:     10/24/2023  Discharge date: 11/04/23  Discharge Physician: Yetta Blanch  PCP: Sabas Norleen PARAS., MD  Recommendations at discharge: Follow up with Orthopedics in 1 week.  Follow up with PCP in 1 week  Repeat BMP and CBC in 1 week.  Follow up with pain management clinic/methadone clinic to establish care. Pt is refusing suboxone  for now to help with acute pain.  Sling immobilization for the first month. He may work on elbow and wrist range of motion as tolerated.    Contact information for follow-up providers     Slatosky, Norleen PARAS., MD. Schedule an appointment as soon as possible for a visit in 1 week(s).   Specialty: Family Medicine Why: with BMP lab to look at kidney/electrolyte numbers, with CBC lab to look at blood counts Contact information: [address] [place] [address] [phone]         Genelle Standing, MD. Schedule an appointment as soon as possible for a visit in 1 week(s).   Specialty: Orthopedic Surgery Contact information: [address] [place] [address] [phone]              Contact information for after-discharge care     Destination     [facility] .   Service: Skilled Nursing Contact information: [address] [place]  [address] [phone]                    Discharge Diagnoses: Principal Problem:   Fall Active Problems:   Closed 3-part fracture of proximal end of right humerus   Leg laceration, right, initial encounter  Hospital Course: Charles Gray is a 68 y.o. male with medical history significant for venous insufficiency, type 2 diabetes, paroxysmal A-fib on Eliquis , chronic diastolic CHF, COPD, chronic pain syndrome on Suboxone , who presents to the ER after a mechanical fall at home.   Patient sustained comminuted displaced proximal right  humerus fracture, was placed on a splint in ED.  WOC consulted for laceration of her left lower extremity, admitted to [facility] service for further management with orthopedic surgery consultation. Ortho advised non surgical management as he is poor surgical candidate. Continue pain control wound care. He got 1 unit PRBC transfused.   Assessment and Plan: Status post mechanical fall Comminuted and displaced proximal right humerus fracture Left lower extremity laceration injury- Orthopedic surgery suggested non operative conservative management for right humerus fracture and wound care for left leg injury. Continue pain control with IV fentanyl  for refractory pain, oral percocet for moderate and severe pain, continue right arm splint, wound care on board for left leg laceration dressing changes. Add scheduled OxyContin . Patient already refusing to use Suboxone . PT/ OT re eval for SNF auth.   Acute blood loss anemia Acute on chronic anemia Iron  deficiency- Patient's hemoglobin >7 s/p 1 unit PRBCs 10/26/23 Continue to closely monitor H&H and transfuse for hemoglobin less than 7. Wound bleeding noted. Hb remained stable. Continue iron  supplements, he did receive IV iron  one time dose per pharmacy.   Chronic diastolic CHF- Patient is euvolemic.  Continue home dose torsemide , metolazone , Toprol -XL, Aldactone . Monitor daily renal function, electrolytes.   Paroxysmal A-fib Patient is currently on metoprolol  xl 25, Eliquis  therapy. Continue telemetry.   Chronic severe Hypokalemia- Due to diuresis. K > 3.5 now. He is requiring 240meq kcl oral daily with 40 meq extra dose  with metolazone  MWF schedule. Mag normal. Continue to monitor daily electrolytes.   Obesity with  Body mass index is 31.89 kg/m.  Diet, exercise and weight reduction advised.   Type 2 diabetes mellitus Diet controlled.   A1c 5.2. Blood sugar stable.  Pain control - [place]  Controlled Substance Reporting System  database was reviewed. and patient was instructed, not to drive, operate heavy machinery, perform activities at heights, swimming or participation in water activities or provide baby-sitting services while on Pain, Sleep and Anxiety Medications; until their outpatient Physician has advised to do so again. Also recommended to not to take more than prescribed Pain, Sleep and Anxiety Medications.  Consultants:  Orthopedics Dr Genelle  Procedures performed:  none  DISCHARGE MEDICATION: Allergies as of 11/04/2023       Reactions   Penicillins Other (See Comments)   Caused convulsions as a child  Did it involve swelling of the face/tongue/throat, SOB, or low BP? N/A Did it involve sudden or severe rash/hives, skin peeling, or any reaction on the inside of your mouth or nose? N/A Did you need to seek medical attention at a hospital or doctor's office?N/A When did it last happen? N/A If all above answers are NO, may proceed with cephalosporin use.        Medication List     STOP taking these medications    Buprenorphine  HCl-Naloxone  HCl 8-2 MG Film   polyethylene glycol 17 g packet Commonly known as: MIRALAX  / GLYCOLAX        TAKE these medications    apixaban  5 MG Tabs tablet Commonly known as: Eliquis  Take 1 tablet (5 mg total) by mouth 2 (two) times daily.   bacitracin  ointment Apply topically daily. Start taking on: November 05, 2023   eplerenone  50 MG tablet Commonly known as: INSPRA  TAKE TWO TABLETS BY MOUTH DAILY   ferrous sulfate  325 (65 FE) MG tablet Take 1 tablet (325 mg total) by mouth daily with breakfast. Start taking on: November 05, 2023   gabapentin  800 MG tablet Commonly known as: NEURONTIN  Take 1 tablet (800 mg total) by mouth 3 (three) times daily.   hydrocerin Crea Apply 1 Application topically 2 (two) times daily.   lactulose  (encephalopathy) 10 GM/15ML Soln Commonly known as: CHRONULAC  Take 30 mLs (20 g total) by mouth 2 (two) times daily. What  changed:  when to take this reasons to take this   methocarbamol  500 MG tablet Commonly known as: ROBAXIN  Take 1 tablet (500 mg total) by mouth 3 (three) times daily.   metolazone  2.5 MG tablet Commonly known as: ZAROXOLYN  Take 1 tablet (2.5 mg total) by mouth 3 (three) times a week. on Mondays Wednesdays and Fridays   metoprolol  succinate 25 MG 24 hr tablet Commonly known as: TOPROL -XL TAKE ONE TABLET BY MOUTH AT BEDTIME   ondansetron  4 MG disintegrating tablet Commonly known as: ZOFRAN -ODT Take 4 mg by mouth every 8 (eight) hours as needed for nausea or vomiting (dissolve orally).   oxyCODONE  15 MG immediate release tablet Commonly known as: ROXICODONE  Take 1 tablet (15 mg total) by mouth every 4 (four) hours as needed for severe pain (pain score 7-10) or moderate pain (pain score 4-6).   oxyCODONE  20 mg 12 hr tablet Commonly known as: OXYCONTIN  Take 1 tablet (20 mg total) by mouth every 12 (twelve) hours for 5 days.   pantoprazole  40 MG tablet Commonly known as: PROTONIX  Take 40 mg by mouth 2 (two) times daily before a meal.   potassium chloride   20 MEQ packet Commonly known as: KLOR-CON  Take 100 mEq by mouth 3 (three) times daily. Take an extra 40 mEq on days when you take metolazone    pravastatin  20 MG tablet Commonly known as: PRAVACHOL  Take 1 tablet by mouth daily. What changed: when to take this   sertraline  100 MG tablet Commonly known as: ZOLOFT  Take 100 mg by mouth at bedtime. At night   tamsulosin  0.4 MG Caps capsule Commonly known as: FLOMAX  Take 1 capsule (0.4 mg total) by mouth daily. Additional refills will need to come from PCP   torsemide  100 MG tablet Commonly known as: DEMADEX  TAKE ONE TABLET BY MOUTH DAILY IN THE MORNING AND EVENING   triamcinolone  0.025 % cream Commonly known as: KENALOG  Apply 1 Application topically 3 (three) times daily as needed (skin irritation).   Ventolin  HFA 108 (90 Base) MCG/ACT inhaler Generic drug:  albuterol  Inhale 1 puff into the lungs every 4 (four) hours as needed for shortness of breath. What changed: how much to take   zolpidem  10 MG tablet Commonly known as: AMBIEN  Take 1 tablet (10 mg total) by mouth at bedtime as needed for sleep. What changed:  when to take this reasons to take this               Discharge Care Instructions  (From admission, onward)           Start     Ordered   11/04/23 0000  Discharge wound care:       Comments: Cleanse L lower leg wound with NS, apply Bacitracin  ointment to open wound bed daily and cover with Telfa nonstick dressing.  Apply xeroform gauze to surrounding dark purple/black tissue.  Cover entire area with ABD pad and secure with Kerlix roll gauze.   11/04/23 1031           Disposition: SNF Diet recommendation: Cardiac diet  Discharge Exam: Vitals:   11/03/23 0812 11/03/23 1500 11/03/23 1943 11/04/23 0810  BP: (!) 104/56 (!) 115/59 (!) 111/59 (!) 116/49  Pulse: 71 85 89 84  Resp: 20 19  16   Temp: 98.2 F (36.8 C) 99.1 F (37.3 C) 98.6 F (37 C) 98.3 F (36.8 C)  TempSrc: Oral Oral Oral   SpO2: 99% 99% 98% 99%  Weight:      Height:       Clear to auscultation  Bilateral improving chronic appearing edema.  Left leg wound wrapped, no significant ozzing through the dressing,  S1 S2 present.  Anxious.  Filed Weights   10/24/23 1846  Weight: (!) 138.3 kg   Condition at discharge: stable  The results of significant diagnostics from this hospitalization (including imaging, microbiology, ancillary and laboratory) are listed below for reference.   Imaging Studies: DG Shoulder Left Result Date: 10/29/2023 CLINICAL DATA:  Left shoulder pain. Mobility impaired. Status post fall last week. EXAM: LEFT SHOULDER - 2+ VIEW COMPARISON:  None Available. FINDINGS: No evidence of acute fracture. Superior subluxation of the humeral head abutting the undersurface of the acromion, typical of chronic rotator cuff  arthropathy. Mild subcortical cystic change in the lateral humeral head. Mild to moderate underlying glenohumeral and acromioclavicular osteoarthritis. No evidence of erosion or focal bone abnormality. IMPRESSION: 1. No acute fracture or dislocation of the left shoulder. 2. Superior subluxation of the humeral head abutting the undersurface of the acromion, typical of chronic rotator cuff arthropathy. 3. Mild to moderate glenohumeral and acromioclavicular osteoarthritis. Electronically Signed   By: Andrea Marlee HERO.D.  On: 10/29/2023 13:49   CT CERVICAL SPINE WO CONTRAST Result Date: 10/24/2023 CLINICAL DATA:  Fall with moderate to severe head and neck trauma. EXAM: CT HEAD WITHOUT CONTRAST CT CERVICAL SPINE WITHOUT CONTRAST TECHNIQUE: Multidetector CT imaging of the head and cervical spine was performed following the standard protocol without intravenous contrast. Multiplanar CT image reconstructions of the cervical spine were also generated. RADIATION DOSE REDUCTION: This exam was performed according to the departmental dose-optimization program which includes automated exposure control, adjustment of the mA and/or kV according to patient size and/or use of iterative reconstruction technique. COMPARISON:  Head CT 07/07/2021, CT scan cervical spine 10/28/2020. FINDINGS: CT HEAD FINDINGS Brain: There are mild features of cerebral atrophy and small-vessel disease. Cerebellum and brainstem are unremarkable. Ventricles are normal in size and position. Chronic dystrophic calcification extends along the frontal falx. No hemorrhage, cortical based acute infarct, mass or mass effect are seen. Basal cisterns are clear. Vascular: No hyperdense vessel or unexpected calcification. Skull: Negative for fractures or focal lesions. No visible scalp hematoma. Sinuses/Orbits: Mild membrane disease both maxillary sinuses. Other paranasal sinuses, bilateral mastoids, and middle ear cavities are clear. S shaped nasal septum with  left-sided spurring. Other: None. CT CERVICAL SPINE FINDINGS Alignment: Within normal limits apart from bone-on-bone anterior atlantodental joint space loss with osteophytes, seen previously. Skull base and vertebrae: Normal bone mineralization. Motion artifact limits imaging through the upper 3 cervical levels. Accounting for motion, no acute cervical fracture or focal pathologic process is suspected. Soft tissues and spinal canal: No prevertebral fluid or swelling. No visible canal hematoma. Trace calcific plaque both proximal cervical ICAs. There is a heterogeneous 2.2 cm in nodule in the left lobe of the thyroid  gland, 1.2 cm hypodense nodule in the right lobe. These are not grossly changed in size. The nodules have been evaluated previously on thyroid  ultrasound 08/14/2020 with the left-sided nodule recommended for a 1 year follow-up exam. This is the last thyroid  ultrasound in our system. If this nodule has not been restudied elsewhere, nonemergent thyroid  ultrasound follow-up is recommended to compare with the 2022 study. Visualized larynx demonstrates no mass. Portions were excluded from the field anteriorly. Disc levels: The discs are diffusely degenerated particularly from C4-5 through C7-T1 where there is complete chronic disc collapse. There are bidirectional endplate osteophytes all levels. Short pedicles exacerbate the mass effect from posterior disc osteophyte complexes, and reduce the effective AP canal diameter from C3-7. As a result, there is mild spondylotic cord compression with spinal stenosis at C3-4, spondylotic compression of the left hemicord at C4-5, with broad-based spondylotic cord compression and spinal stenosis C5-6 and C6-7. There are multilevel uncinate and facet osteophytes and acquired foraminal stenosis, greatest on the right at C4-5, with severe foraminal narrowing bilaterally at C5-6 and C6-7. Upper chest: Negative. Other: None. IMPRESSION: 1. No acute intracranial CT findings or  depressed skull fractures. 2. Atrophy and small-vessel disease.  Stable exam. 3. Motion artifact limits imaging through the upper 3 cervical levels. Accounting for this, no acute cervical fractures are suspected. 4. Multilevel cervical degenerative change, spondylotic cord compression and acquired foraminal stenosis. 5. Bilateral thyroid  nodules, not grossly changed in size. Follow-up was recommended in 2022. If there has not been intervening thyroid  ultrasound elsewhere, nonemergent follow-up ultrasound is recommended. 6. Mild maxillary sinus membrane disease.  S shaped nasal septum. 7. Trace proximal cervical ICA atherosclerosis. Electronically Signed   By: Francis Quam M.D.   On: 10/24/2023 21:43   CT HEAD WO CONTRAST Result Date: 10/24/2023  CLINICAL DATA:  Fall with moderate to severe head and neck trauma. EXAM: CT HEAD WITHOUT CONTRAST CT CERVICAL SPINE WITHOUT CONTRAST TECHNIQUE: Multidetector CT imaging of the head and cervical spine was performed following the standard protocol without intravenous contrast. Multiplanar CT image reconstructions of the cervical spine were also generated. RADIATION DOSE REDUCTION: This exam was performed according to the departmental dose-optimization program which includes automated exposure control, adjustment of the mA and/or kV according to patient size and/or use of iterative reconstruction technique. COMPARISON:  Head CT 07/07/2021, CT scan cervical spine 10/28/2020. FINDINGS: CT HEAD FINDINGS Brain: There are mild features of cerebral atrophy and small-vessel disease. Cerebellum and brainstem are unremarkable. Ventricles are normal in size and position. Chronic dystrophic calcification extends along the frontal falx. No hemorrhage, cortical based acute infarct, mass or mass effect are seen. Basal cisterns are clear. Vascular: No hyperdense vessel or unexpected calcification. Skull: Negative for fractures or focal lesions. No visible scalp hematoma. Sinuses/Orbits:  Mild membrane disease both maxillary sinuses. Other paranasal sinuses, bilateral mastoids, and middle ear cavities are clear. S shaped nasal septum with left-sided spurring. Other: None. CT CERVICAL SPINE FINDINGS Alignment: Within normal limits apart from bone-on-bone anterior atlantodental joint space loss with osteophytes, seen previously. Skull base and vertebrae: Normal bone mineralization. Motion artifact limits imaging through the upper 3 cervical levels. Accounting for motion, no acute cervical fracture or focal pathologic process is suspected. Soft tissues and spinal canal: No prevertebral fluid or swelling. No visible canal hematoma. Trace calcific plaque both proximal cervical ICAs. There is a heterogeneous 2.2 cm in nodule in the left lobe of the thyroid  gland, 1.2 cm hypodense nodule in the right lobe. These are not grossly changed in size. The nodules have been evaluated previously on thyroid  ultrasound 08/14/2020 with the left-sided nodule recommended for a 1 year follow-up exam. This is the last thyroid  ultrasound in our system. If this nodule has not been restudied elsewhere, nonemergent thyroid  ultrasound follow-up is recommended to compare with the 2022 study. Visualized larynx demonstrates no mass. Portions were excluded from the field anteriorly. Disc levels: The discs are diffusely degenerated particularly from C4-5 through C7-T1 where there is complete chronic disc collapse. There are bidirectional endplate osteophytes all levels. Short pedicles exacerbate the mass effect from posterior disc osteophyte complexes, and reduce the effective AP canal diameter from C3-7. As a result, there is mild spondylotic cord compression with spinal stenosis at C3-4, spondylotic compression of the left hemicord at C4-5, with broad-based spondylotic cord compression and spinal stenosis C5-6 and C6-7. There are multilevel uncinate and facet osteophytes and acquired foraminal stenosis, greatest on the right at  C4-5, with severe foraminal narrowing bilaterally at C5-6 and C6-7. Upper chest: Negative. Other: None. IMPRESSION: 1. No acute intracranial CT findings or depressed skull fractures. 2. Atrophy and small-vessel disease.  Stable exam. 3. Motion artifact limits imaging through the upper 3 cervical levels. Accounting for this, no acute cervical fractures are suspected. 4. Multilevel cervical degenerative change, spondylotic cord compression and acquired foraminal stenosis. 5. Bilateral thyroid  nodules, not grossly changed in size. Follow-up was recommended in 2022. If there has not been intervening thyroid  ultrasound elsewhere, nonemergent follow-up ultrasound is recommended. 6. Mild maxillary sinus membrane disease.  S shaped nasal septum. 7. Trace proximal cervical ICA atherosclerosis. Electronically Signed   By: Francis Quam M.D.   On: 10/24/2023 21:43   DG Tibia/Fibula Left Result Date: 10/24/2023 CLINICAL DATA:  Left lower extremity pain.  Fall. EXAM: LEFT TIBIA AND FIBULA -  2 VIEW COMPARISON:  None Available. FINDINGS: There is no evidence of fracture or other focal bone lesions. No erosions or periostitis. Degenerative change of the knee. Generalized soft tissue edema. IMPRESSION: Generalized soft tissue edema. No acute osseous abnormality. Electronically Signed   By: Andrea Gasman M.D.   On: 10/24/2023 19:06   DG Shoulder Right Result Date: 10/24/2023 CLINICAL DATA:  Right shoulder pain after fall. EXAM: RIGHT SHOULDER - 2+ VIEW COMPARISON:  10/28/2020 FINDINGS: Comminuted and displaced proximal humerus fracture. There is 9 mm displacement of a component involving the surgical neck. There is displacement involving the lateral humeral head. Displaced fragment is seen medially just inferior to the glenohumeral joint. No frank dislocation. Acromioclavicular degenerative spurring. IMPRESSION: Comminuted and displaced proximal humerus fracture. Electronically Signed   By: Andrea Gasman M.D.   On:  10/24/2023 19:05   DG Chest Port 1 View Result Date: 10/24/2023 CLINICAL DATA:  Trauma, right shoulder pain. EXAM: PORTABLE CHEST 1 VIEW COMPARISON:  Radiograph 03/04/2022, CT 07/08/2021 FINDINGS: Stable heart size and mediastinal contours. CardioMEMS on the left. Mild diffuse bronchial thickening. No pneumothorax, large pleural effusion or focal airspace disease. Remote right rib fractures. No evidence of acute rib fracture on portable exam. Right proximal humerus fracture is better assessed on concurrent shoulder exam. IMPRESSION: 1. Right proximal humeral fracture, assessed on concurrent shoulder exam. No evidence of additional acute traumatic injury. 2. Mild diffuse bronchial thickening. Electronically Signed   By: Andrea Gasman M.D.   On: 10/24/2023 19:04    Microbiology: Results for orders placed or performed during the hospital encounter of 03/04/22  Resp Panel by RT-PCR (Flu A&B, Covid) Anterior Nasal Swab     Status: None   Collection Time: 03/04/22  3:33 PM   Specimen: Anterior Nasal Swab  Result Value Ref Range Status   SARS Coronavirus 2 by RT PCR NEGATIVE NEGATIVE Final    Comment: (NOTE) SARS-CoV-2 target nucleic acids are NOT DETECTED.  The SARS-CoV-2 RNA is generally detectable in upper respiratory specimens during the acute phase of infection. The lowest concentration of SARS-CoV-2 viral copies this assay can detect is 138 copies/mL. A negative result does not preclude SARS-Cov-2 infection and should not be used as the sole basis for treatment or other patient management decisions. A negative result may occur with  improper specimen collection/handling, submission of specimen other than nasopharyngeal swab, presence of viral mutation(s) within the areas targeted by this assay, and inadequate number of viral copies(<138 copies/mL). A negative result must be combined with clinical observations, patient history, and epidemiological information. The expected result is  Negative.  Fact Sheet for Patients:  BloggerCourse.com  Fact Sheet for Healthcare Providers:  SeriousBroker.it  This test is no t yet approved or cleared by the [place]  FDA and  has been authorized for detection and/or diagnosis of SARS-CoV-2 by FDA under an Emergency Use Authorization (EUA). This EUA will remain  in effect (meaning this test can be used) for the duration of the COVID-19 declaration under Section 564(b)(1) of the Act, 21 U.S.C.section 360bbb-3(b)(1), unless the authorization is terminated  or revoked sooner.       Influenza A by PCR NEGATIVE NEGATIVE Final   Influenza B by PCR NEGATIVE NEGATIVE Final    Comment: (NOTE) The Xpert Xpress SARS-CoV-2/FLU/RSV plus assay is intended as an aid in the diagnosis of influenza from Nasopharyngeal swab specimens and should not be used as a sole basis for treatment. Nasal washings and aspirates are unacceptable for Xpert Xpress SARS-CoV-2/FLU/RSV testing.  Fact Sheet for Patients: BloggerCourse.com  Fact Sheet for Healthcare Providers: SeriousBroker.it  This test is not yet approved or cleared by the [place]  FDA and has been authorized for detection and/or diagnosis of SARS-CoV-2 by FDA under an Emergency Use Authorization (EUA). This EUA will remain in effect (meaning this test can be used) for the duration of the COVID-19 declaration under Section 564(b)(1) of the Act, 21 U.S.C. section 360bbb-3(b)(1), unless the authorization is terminated or revoked.  Performed at [facility], [address], [place], [place] [address]    Labs: CBC: Recent Labs  Lab 10/30/23 0435 11/01/23 0645 11/02/23 0639 11/03/23 0522 11/04/23 0556  WBC 5.9 5.2 5.2 5.1 5.7  HGB 7.6* 7.8* 7.9* 7.6* 7.9*  HCT 26.4* 27.7* 27.9* 26.7* 27.7*  MCV 72.5* 73.9* 74.2* 74.2* 74.5*  PLT 137* 148* 149* 168 180   Basic  Metabolic Panel: Recent Labs  Lab 10/31/23 0737 11/01/23 0645 11/02/23 0639 11/03/23 0522 11/04/23 0556  NA 135 138 136 135 138  K 3.7 3.8 3.6 3.4* 3.5  CL 96* 95* 96* 95* 96*  CO2 28 33* 30 29 30   GLUCOSE 112* 106* 96 140* 127*  BUN 19 18 17 20 21   CREATININE 0.96 0.91 0.94 0.99 1.02  CALCIUM 8.7* 8.8* 8.8* 8.7* 9.0  MG  --   --   --   --  1.8   Liver Function Tests: No results for input(s): AST, ALT, ALKPHOS, BILITOT, PROT, ALBUMIN  in the last 168 hours. CBG: No results for input(s): GLUCAP in the last 168 hours.  Discharge time spent: greater than 30 minutes.  Author: Yetta Blanch, MD  [facility]

## 2023-11-04 NOTE — TOC Progression Note (Signed)
 Transition of Care [facility]) - Progression Note    Patient Details  Name: Charles Gray MRN: [id number] Date of Birth: Apr 02, 1956  Transition of Care [facility]) CM/SW Contact  Charles Gray [place], [place] Phone Number: 11/04/2023, 9:55 AM  Clinical Narrative:  Home and Community/[employer] auth for [facility] received: [id number], valid 7/24-7/28. Confirmed with Lauren in admissions they are prepared to admit pt today pending medical clearance. MD updated.   Gray Charles, MSW, LCSW [phone] (coverage)      Expected Discharge Plan: Skilled Nursing Facility Barriers to Discharge: SNF Pending bed offer, Continued Medical Work up               Expected Discharge Plan and Services In-house Referral: Clinical Social Work   Post Acute Care Choice: Skilled Nursing Facility Living arrangements for the past 2 months: Single Family Home                                       Social Drivers of Health (SDOH) Interventions SDOH Screenings   Food Insecurity: No Food Insecurity (11/02/2023)  Housing: Low Risk  (11/02/2023)  Transportation Needs: Unmet Transportation Needs (11/02/2023)  Utilities: Not At Risk (11/02/2023)  Alcohol Screen: Low Risk  (06/11/2020)  Depression (PHQ2-9): Medium Risk (06/11/2020)  Financial Resource Strain: Medium Risk (11/15/2020)  Tobacco Use: Low Risk  (10/24/2023)    Readmission Risk Interventions     No data to display

## 2023-11-04 NOTE — TOC Transition Note (Addendum)
 Transition of Care [facility]) - Discharge Note   Patient Details  Name: Charles Gray MRN: [id number] Date of Birth: 1955/07/05  Transition of Care [facility]) CM/SW Contact:  Gwenn Julien [place], [place] Phone Number: 11/04/2023, 10:56 AM   Clinical Narrative: Pt for dc to [facility] today. Spoke to Lauren in admissions who confirmed they are prepard to admit pt to room 315A. Pt aware of dc and reports agreeable. HIPAA compliant voicemail left for pt's son notifying him of dc. RN provided with number for report and PTAR arranged for transport. SW signing off at dc.   UPDATE 1115: Pt requesting to have a provider discuss lab results prior to dc. MD and RN updated.   Julien Gwenn, MSW, LCSW [phone] (coverage)      Final next level of care: Skilled Nursing Facility Barriers to Discharge: Barriers Resolved   Patient Goals and CMS Choice Patient states their goals for this hospitalization and ongoing recovery are:: Heal up CMS Medicare.gov Compare Post Acute Care list provided to:: Patient Choice offered to / list presented to : Patient      Discharge Placement              Patient chooses bed at: Other - please specify in the comment section below: ( Rehab) Patient to be transferred to facility by: PTAR Name of family member notified: LVM for pt's son/Jason Patient and family notified of of transfer: 11/04/23  Discharge Plan and Services Additional resources added to the After Visit Summary for   In-house Referral: Clinical Social Work   Post Acute Care Choice: Skilled Nursing Facility                               Social Drivers of Health (SDOH) Interventions SDOH Screenings   Food Insecurity: No Food Insecurity (11/02/2023)  Housing: Low Risk  (11/02/2023)  Transportation Needs: Unmet Transportation Needs (11/02/2023)  Utilities: Not At Risk (11/02/2023)  Alcohol Screen: Low Risk  (06/11/2020)  Depression (PHQ2-9): Medium Risk (06/11/2020)  Financial  Resource Strain: Medium Risk (11/15/2020)  Tobacco Use: Low Risk  (10/24/2023)     Readmission Risk Interventions     No data to display

## 2023-11-05 DIAGNOSIS — E119 Type 2 diabetes mellitus without complications: Secondary | ICD-10-CM | POA: Diagnosis not present

## 2023-11-05 DIAGNOSIS — S42251D Displaced fracture of greater tuberosity of right humerus, subsequent encounter for fracture with routine healing: Secondary | ICD-10-CM | POA: Diagnosis not present

## 2023-11-05 DIAGNOSIS — I480 Paroxysmal atrial fibrillation: Secondary | ICD-10-CM | POA: Diagnosis not present

## 2023-11-05 DIAGNOSIS — I872 Venous insufficiency (chronic) (peripheral): Secondary | ICD-10-CM | POA: Diagnosis not present

## 2023-11-08 DIAGNOSIS — R609 Edema, unspecified: Secondary | ICD-10-CM | POA: Diagnosis not present

## 2023-11-08 DIAGNOSIS — K7581 Nonalcoholic steatohepatitis (NASH): Secondary | ICD-10-CM | POA: Diagnosis not present

## 2023-11-08 DIAGNOSIS — F32A Depression, unspecified: Secondary | ICD-10-CM | POA: Diagnosis not present

## 2023-11-08 DIAGNOSIS — W19XXXD Unspecified fall, subsequent encounter: Secondary | ICD-10-CM | POA: Diagnosis not present

## 2023-11-10 ENCOUNTER — Ambulatory Visit (HOSPITAL_BASED_OUTPATIENT_CLINIC_OR_DEPARTMENT_OTHER): Admitting: Orthopaedic Surgery

## 2023-11-11 ENCOUNTER — Telehealth: Payer: Self-pay | Admitting: Student

## 2023-11-11 DIAGNOSIS — S42251D Displaced fracture of greater tuberosity of right humerus, subsequent encounter for fracture with routine healing: Secondary | ICD-10-CM | POA: Diagnosis not present

## 2023-11-11 DIAGNOSIS — K5903 Drug induced constipation: Secondary | ICD-10-CM | POA: Diagnosis not present

## 2023-11-11 DIAGNOSIS — E876 Hypokalemia: Secondary | ICD-10-CM | POA: Diagnosis not present

## 2023-11-11 NOTE — Telephone Encounter (Signed)
 Received page as on-call cardiology provider from a Charles Gray regarding patient (identified as a friend).  Unable to talk to patient myself as he is under the care of of rehab/nursing facility and she was not with him.  She was concerned about a reported potassium of 2.3 today and symptoms of constipation.  She stated that she had permission to call on the patient's behalf but this could not be confirmed.  She also stated that the nursing home/facility was giving him extra potassium.  I discussed issues regarding HIPAA compliance and my inability to confirm the objective data/speak about patient's medical history and that she needed to reach out to the rehab/nursing facility herself and their on-call provider if there was concern regarding patient symptoms and stability.  Discussed if there was concern from her and or patient regarding symptoms that they always have the option to seek emergency care at an emergency department or through 911.  She demonstrated understanding of this.

## 2023-11-14 ENCOUNTER — Telehealth: Payer: Self-pay | Admitting: Cardiology

## 2023-11-14 NOTE — Telephone Encounter (Signed)
 Received a call from this patient who was recently discharged from [facility]. He is currently at rehab and reported his potassium returned at 2.4 and he has not been receiving as many potassium pills as he normally does.   Patient was inquiring about advice for treatment for this. Recommended that he talk to his nurse and physician at his rehab if they are able to treat her low-potassium levels. If not, would recommend transfer to a local facility/ER that would be able to further treat his hypokalemia.  Will forward to Dr. Rolan per patient's request.   Earlene CHRISTELLA Cluster  11/14/23 7:06 PM

## 2023-11-16 ENCOUNTER — Telehealth (HOSPITAL_COMMUNITY): Payer: Self-pay

## 2023-11-16 NOTE — Telephone Encounter (Signed)
 Called to speak to [facility] but no answer- left message and will await a phone call to schedule paramedicine visit.   Powell Mirza, EMT-Paramedic [phone] 11/16/2023

## 2023-11-17 ENCOUNTER — Telehealth (HOSPITAL_COMMUNITY): Payer: Self-pay

## 2023-11-17 DIAGNOSIS — I447 Left bundle-branch block, unspecified: Secondary | ICD-10-CM

## 2023-11-17 DIAGNOSIS — I491 Atrial premature depolarization: Secondary | ICD-10-CM

## 2023-11-17 NOTE — Telephone Encounter (Signed)
 Spoke to Mr. Cypert this evening who reports he is admitted at [facility] for low potassium. He will be discharged back to SNF at [facility].   Forwarded to NP Greig Mosses for permission to d/c from paramedicine.    Powell Mirza, EMT-Paramedic [phone] 11/17/2023

## 2023-11-19 ENCOUNTER — Encounter (HOSPITAL_COMMUNITY): Payer: Self-pay | Admitting: Adult Health

## 2023-11-19 ENCOUNTER — Ambulatory Visit (HOSPITAL_BASED_OUTPATIENT_CLINIC_OR_DEPARTMENT_OTHER): Admitting: Orthopaedic Surgery

## 2023-11-19 NOTE — Progress Notes (Signed)
  HF [facility] Discharge  Discussed with HF Paramedic and HF Team.   Discharging to Skill Nursing Facility   Monico Sudduth NP-C  12:03 PM

## 2023-11-19 NOTE — Telephone Encounter (Signed)
 Ok thanks for the update. We can re enroll if needed.   Arrayah Connors NP-C  12:02 PM

## 2023-11-22 ENCOUNTER — Telehealth (HOSPITAL_COMMUNITY): Payer: Self-pay

## 2023-11-22 NOTE — Telephone Encounter (Signed)
 Patient is now discharged from [facility].     Discharge Comments:  Mr. Molinari has been admitted to a SNF and will not be returning home at present. Mr. Giammarino agreed to paramedicine discharge and understands he can be re-enrolled once he is seen in clinic again at his return home. He was thankful for our services. Call complete.   Powell Mirza, EMT-Paramedic [phone] 11/22/2023

## 2023-11-26 ENCOUNTER — Encounter (HOSPITAL_COMMUNITY): Admitting: Cardiology

## 2023-12-01 ENCOUNTER — Encounter: Payer: Self-pay | Admitting: Internal Medicine

## 2023-12-15 NOTE — Progress Notes (Incomplete)
 ADVANCED HF CLINIC NOTE  PCP: Sabas Norleen PARAS., MD Primary Cardiologist: Dr. Dub Huntsman, DO  HF Cardiology: Dr. Rolan  HPI: 68 y.o. 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    He had multiple hospitalizations in 2021 for CHF exacerbation, requiring IV Lasix  and change in PO regimen from lasix  to torsemide . Echo 4/21 showed normal LVEF 55-60% w/ G1DD. RV not well visualized, but felt to have component of RV failure. RHC 4/21 showed normal PA pressure and normal cardiac output. PFTs also completed and c/w mild obstructive airway disease.   In 7/21, he had a coronary CTA.  This showed no coronary calcium but there was a concern for soft plaque in the distal LCx and distal RCA with hemodynamically significant stenosis.  He was managed medically. Had not had any chest pain, but was started on ranolazine  after his CTA.  Made his  head fuzzy.    Admitted with A/C diastolic heart failure 01/2020. Diuresed with IV lasix  and transitioned to bumex  3 mg twice a day. Discharge weight 360 pounds.   He was admitted again in 12/21 with CHF.  Echo 12/21 showed EF 60-65%, GIIDD, RV poorly visualized.   Admitted 06/09/20 with A/C diastolic heart failure. Diuresed with IV lasix  + metolazone . Transitioned to torsemide  60 mg twice a day and metolazone  twice a day. D/C weight 364 pounds.   Admitted 5/28//22 with cellulitis. Treated with antibiotics.   Admitted 09/20/20 with  A/C CHF. He diuresed 5 L with IV lasix , weight 371 at discharge.  He was readmitted for A/C CHF 7/22. He was diuresed with IV lasix  + metolazone . RHC showed  optimized filling pressures and preserved CO/CI. He was discharged on torsemide  100 mg bid + metolazone  M/W/F. Discharge weight 374 lbs.  RHC (8/22) showed normal filling pressures & he underwent Cardiomems placement.  Admitted 9/10-9/12/22 for acute encephalopathy, felt secondary  to polypharmacy. Ambien  and gabapentin  were briefly held. Ammonia mildly elevated and he was started on lactulose . Mentation returned to baseline and he was discharged home with Home Health PT/OT.  Saw ID for suspected cellulitis. No active infection identified. Referral to lymphedema clinic submitted.   Admitted 3/23 with a/c dHF and worsening LE edema. Echo showed EF 60-65% RV normal this admission. He was diuresed with IV Lasix , down 20 lbs. LE venous dopplers negative DVT. Hospitalization complicated by severe  hypokalemia, discharged home on KCL 100 tid, weight 377.7 lbs. Cardiomems goal lowered to 9.  Admitted 11/23 with CHF exacerbation, weight up to 407 lbs, despite normal PADP readings. He was diuresed with IV lasix  and metolazone .  Echo showed EF 55-60%, RV not well-visualized. Discharged home, weight 385 lbs.  Today he returns for HF follow up with his son. Overall feeling fine. He has SOB walking short distances on flat ground. Uses a walker to get around his home. Feels palpitations. Continues with chronic lower extremity edema. Denies abnormal bleeding, CP, dizziness, or PND/Orthopnea. Appetite ok. No fever or chills. Weight at home 286 pounds. Taking all medications. Followed by [facility]. Has not had unna boots in a couple months. Struggles with urinary incontinence.  Cardiomems (personally reviewed from 05/20/23): PADP 11  (goal 9).   ECG (personally reviewed): NSR 91 bpm  PMH: 1. HTN 2. OSA 3. Hyperlipidemia 4. Low back pain 5. PE/DVT in 2/21.  - V/Q scan in 6/21 was not suggestive of chronic PE.  6. Nonalcoholic steatohepatitis.  7. GI  bleed in 10/20.  8. Anxiety 9. CAD: Coronary CTA in 7/21 with calcium score 0 but concern for soft plaque distal CFX and RCA.  By [facility], there was flow-limiting stenosis in distal CFX and distal RCA.  10. Chronic diastolic CHF: Echo in 4/21 with EF 55-60%, RV poorly visualized.  - RHC (4/21): mean RA 8, PA 20/8, mean PCWP 11, CI 2.8 - Echo  (12/21): EF 60-65%, grade 2 diastolic dysfunction, RV poorly visualized.   - RHC (8/22) normal filling pressures, RA mean 7, RV 29/9, PA 32/15, mean 22, PCWP mean 13, CO/CI 7.32/2.41. - Cardiomems (8/22) placement. - Echo (3/23): EF 60-65%, mild LVH, normal RV - Echo (11/23): EF 55-60%, RV not well-visualized.  11. PFTs (4/21): Mild obstruction.  12. ABIs (10/21): Normal. 13. Fe deficiency anemia.   Review of Systems: All systems reviewed and negative except as per HPI.   Current Outpatient Medications  Medication Sig Dispense Refill   apixaban  (ELIQUIS ) 5 MG TABS tablet Take 1 tablet (5 mg total) by mouth 2 (two) times daily. 60 tablet 3   bacitracin  ointment Apply topically daily. 120 g 0   eplerenone  (INSPRA ) 50 MG tablet TAKE TWO TABLETS BY MOUTH DAILY 60 tablet 5   ferrous sulfate  325 (65 FE) MG tablet Take 1 tablet (325 mg total) by mouth daily with breakfast. 30 tablet 0   gabapentin  (NEURONTIN ) 800 MG tablet Take 1 tablet (800 mg total) by mouth 3 (three) times daily. 15 tablet 0   hydrocerin (EUCERIN) CREA Apply 1 Application topically 2 (two) times daily.     lactulose , encephalopathy, (CHRONULAC ) 10 GM/15ML SOLN Take 30 mLs (20 g total) by mouth 2 (two) times daily. (Patient taking differently: Take 20 g by mouth 2 (two) times daily as needed.)     methocarbamol  (ROBAXIN ) 500 MG tablet Take 1 tablet (500 mg total) by mouth 3 (three) times daily. 30 tablet 0   metolazone  (ZAROXOLYN ) 2.5 MG tablet Take 1 tablet (2.5 mg total) by mouth 3 (three) times a week. on Mondays Wednesdays and Fridays 15 tablet 3   metoprolol  succinate (TOPROL -XL) 25 MG 24 hr tablet TAKE ONE TABLET BY MOUTH AT BEDTIME 30 tablet 11   ondansetron  (ZOFRAN -ODT) 4 MG disintegrating tablet Take 4 mg by mouth every 8 (eight) hours as needed for nausea or vomiting (dissolve orally).     oxyCODONE  (ROXICODONE ) 15 MG immediate release tablet Take 1 tablet (15 mg total) by mouth every 4 (four) hours as needed for severe  pain (pain score 7-10) or moderate pain (pain score 4-6). 30 tablet 0   pantoprazole  (PROTONIX ) 40 MG tablet Take 40 mg by mouth 2 (two) times daily before a meal.     potassium chloride  (KLOR-CON ) 20 MEQ packet Take 100 mEq by mouth 3 (three) times daily. Take an extra 40 mEq on days when you take metolazone  475 packet 3   pravastatin  (PRAVACHOL ) 20 MG tablet Take 1 tablet by mouth daily. (Patient taking differently: Take 20 mg by mouth at bedtime.) 30 tablet 11   sertraline  (ZOLOFT ) 100 MG tablet Take 100 mg by mouth at bedtime. At night     tamsulosin  (FLOMAX ) 0.4 MG CAPS capsule Take 1 capsule (0.4 mg total) by mouth daily. Additional refills will need to come from PCP 30 capsule 0   torsemide  (DEMADEX ) 100 MG tablet TAKE ONE TABLET BY MOUTH DAILY IN THE MORNING AND EVENING 90 tablet 3   triamcinolone  (KENALOG ) 0.025 % cream Apply 1 Application topically 3 (three)  times daily as needed (skin irritation).     VENTOLIN  HFA 108 (90 Base) MCG/ACT inhaler Inhale 1 puff into the lungs every 4 (four) hours as needed for shortness of breath. (Patient taking differently: Inhale 2 puffs into the lungs every 4 (four) hours as needed for shortness of breath.) 6.7 g 1   zolpidem  (AMBIEN ) 10 MG tablet Take 1 tablet (10 mg total) by mouth at bedtime as needed for sleep. 5 tablet 0   No current facility-administered medications for this visit.   Allergies  Allergen Reactions   Penicillins Other (See Comments)    Caused convulsions as a child  Did it involve swelling of the face/tongue/throat, SOB, or low BP? N/A Did it involve sudden or severe rash/hives, skin peeling, or any reaction on the inside of your mouth or nose? N/A Did you need to seek medical attention at a hospital or doctor's office?N/A When did it last happen? N/A If all above answers are NO, may proceed with cephalosporin use.   Social History   Socioeconomic History   Marital status: Single    Spouse name: Not on file   Number of  children: Not on file   Years of education: Not on file   Highest education level: Not on file  Occupational History   Not on file  Tobacco Use   Smoking status: Never   Smokeless tobacco: Never  Vaping Use   Vaping status: Never Used  Substance and Sexual Activity   Alcohol use: Not Currently   Drug use: Not Currently    Types: Marijuana    Comment: Smoked for 30 years   Sexual activity: Not on file  Other Topics Concern   Not on file  Social History Narrative   Not on file   Social Drivers of Health   Financial Resource Strain: Medium Risk (11/15/2020)   Overall Financial Resource Strain (CARDIA)    Difficulty of Paying Living Expenses: Somewhat hard  Food Insecurity: No Food Insecurity (11/02/2023)   Hunger Vital Sign    Worried About Running Out of Food in the Last Year: Never true    Ran Out of Food in the Last Year: Never true  Transportation Needs: Unmet Transportation Needs (11/02/2023)   PRAPARE - Administrator, Civil Service (Medical): Yes    Lack of Transportation (Non-Medical): Yes  Physical Activity: Not on file  Stress: Not on file  Social Connections: Not on file  Intimate Partner Violence: Unknown (11/02/2023)   Humiliation, Afraid, Rape, and Kick questionnaire    Fear of Current or Ex-Partner: No    Emotionally Abused: No    Physically Abused: Not on file    Sexually Abused: No    Family History  Problem Relation Age of Onset   Hyperlipidemia Mother    Hypertension Mother    Stroke Mother    CAD Maternal Grandmother    CAD Maternal Grandfather    There were no vitals taken for this visit.  Wt Readings from Last 3 Encounters:  10/24/23 (!) 138.3 kg (305 lb)  10/19/23 (!) 139.3 kg (307 lb)  10/05/23 (!) 137.4 kg (303 lb)   PHYSICAL EXAM: General:  NAD. No resp difficulty, arrived in [facility], chronically-ill appearing HEENT: Normal Neck: Supple. No JVD. Cor: Regular rate & rhythm. No rubs, gallops or murmurs. Lungs: Clear Abdomen: Soft,  obese, nontender, nondistended.  Extremities: No cyanosis, clubbing, rash, 2-3+ BLE chronic edema, + weeping Neuro: Alert & oriented x 3, moves all 4 extremities w/o  difficulty. Affect pleasant.  ASSESSMENT & PLAN: 1. Chronic diastolic CHF with suspected RV dysfunction: Echo 7/22 with EF 60-65%, mild LVH, normal RV, unable to estimate PA systolic pressure. Multiple CHF admissions despite aggressive outpatient diuretic regimen and paramedicine.  RHC (7/22) with optimized filling pressures, PCWP 13, CO/CI 7.3/2.4. RHC (8/22) with normal filling pressures; underwent placement of Cardiomems (dPAP goal 9 mmHg).  Echo 11/23 with EF 55-60%, RV not well-visualized. He has lost 60 lbs since last visit.  Chronic NYHA III symptoms, functional class confounded by body habitus and chronic lymphedema. He is not volume overloaded by Cardiomems and no JVD noted. Significant venous insufficiency.  - Will see if we can arrange unna boots.  - Continue torsemide  100 mg bid + metolazone  2.5 mg/extra 40 KCL 3 days/week. BMET today. - He struggles with urinary incontinence and needs urinary supplies throughout the day in order to be compliant with his diuretic regimen. - Continue 100 KCL tid. - Continue eplerenone  100 mg daily.  - Continue Toprol  XL 25 mg qhs.  - GI upset with Jardiance  and Farxiga .  2. H/o PE/DVT: Suspect due to sedentary lifestyle.  Occurred in 2/21.  V/Q scan in 6/21 did not show evidence for chronic PE.   - Continue Eliquis . No bleeding issues. CBC today. 3. OSA: Cannot tolerate CPAP. - No change. 4. Anxiety/depression: This is a major issue.   - Continue sertraline  100 mg daily.  5. Hypertension: BP well-controlled - Continue current BP active meds 6. Atrial fibrillation: Paroxysmal. NSR today.  Rare symptoms.   - Continue apixaban .  - Continue Toprol  XL. 7. Cirrhosis: CT scan in past noted nodular contour of liver, compatible with underlying cirrhosis. In setting of known RV dysfunction and  suspect cardiogenic. LFTs ok. EGD 12/22 showed portal hypertensive gastropathy, no varices.  - On lactulose  (also takes this for chronic constipation)    Continue paramedicine, appreciate their help.   Follow up in 4 months with Dr. Rolan Raisin [facility] FNP-BC 12/15/23

## 2023-12-16 ENCOUNTER — Telehealth (HOSPITAL_COMMUNITY): Payer: Self-pay

## 2023-12-16 NOTE — Telephone Encounter (Signed)
 Called to confirm/remind patient of their appointment at [facility] on 12/17/23.   Appointment:   [x] Confirmed  [] Left mess   [] No answer/No voice mail  [] VM Full/unable to leave message  [] Phone not in service  Patient reminded to bring all medications and/or complete list.  Confirmed patient has transportation. Gave directions, instructed to utilize valet parking.

## 2023-12-17 ENCOUNTER — Inpatient Hospital Stay (HOSPITAL_COMMUNITY)
Admission: RE | Admit: 2023-12-17 | Discharge: 2023-12-17 | Disposition: A | Discharge status: Home / Self Care | Source: Ambulatory Visit

## 2023-12-22 ENCOUNTER — Telehealth (HOSPITAL_COMMUNITY): Payer: Self-pay

## 2023-12-22 NOTE — Telephone Encounter (Signed)
 Called to confirm/remind patient of their appointment at [facility] on 12/23/23 11:30.   Appointment:   [x] Confirmed  [] Left mess   [] No answer/No voice mail  [] VM Full/unable to leave message  [] Phone not in service  Patient reminded to bring all medications and/or complete list.  Confirmed patient has transportation. Gave directions, instructed to utilize valet parking.

## 2023-12-22 NOTE — Progress Notes (Incomplete)
 ADVANCED HF CLINIC NOTE  PCP: Sabas Norleen PARAS., MD Primary Cardiologist: Dr. Dub Huntsman, DO  HF Cardiology: Dr. Rolan  HPI: 68 y.o. 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    He had multiple hospitalizations in 2021 for CHF exacerbation, requiring IV Lasix  and change in PO regimen from lasix  to torsemide . Echo 4/21 showed normal LVEF 55-60% w/ G1DD. RV not well visualized, but felt to have component of RV failure. RHC 4/21 showed normal PA pressure and normal cardiac output. PFTs also completed and c/w mild obstructive airway disease.   In 7/21, he had a coronary CTA.  This showed no coronary calcium but there was a concern for soft plaque in the distal LCx and distal RCA with hemodynamically significant stenosis.  He was managed medically. Had not had any chest pain, but was started on ranolazine  after his CTA.  Made his  head fuzzy.    Admitted with A/C diastolic heart failure 01/2020. Diuresed with IV lasix  and transitioned to bumex  3 mg twice a day. Discharge weight 360 pounds.   He was admitted again in 12/21 with CHF.  Echo 12/21 showed EF 60-65%, GIIDD, RV poorly visualized.   Admitted 06/09/20 with A/C diastolic heart failure. Diuresed with IV lasix  + metolazone . Transitioned to torsemide  60 mg twice a day and metolazone  twice a day. D/C weight 364 pounds.   Admitted 5/28//22 with cellulitis. Treated with antibiotics.   Admitted 09/20/20 with  A/C CHF. He diuresed 5 L with IV lasix , weight 371 at discharge.  He was readmitted for A/C CHF 7/22. He was diuresed with IV lasix  + metolazone . RHC showed  optimized filling pressures and preserved CO/CI. He was discharged on torsemide  100 mg bid + metolazone  M/W/F. Discharge weight 374 lbs.  RHC (8/22) showed normal filling pressures & he underwent Cardiomems placement.  Admitted 9/10-9/12/22 for acute encephalopathy, felt secondary  to polypharmacy. Ambien  and gabapentin  were briefly held. Ammonia mildly elevated and he was started on lactulose . Mentation returned to baseline and he was discharged home with Home Health PT/OT.  Saw ID for suspected cellulitis. No active infection identified. Referral to lymphedema clinic submitted.   Admitted 3/23 with a/c dHF and worsening LE edema. Echo showed EF 60-65% RV normal this admission. He was diuresed with IV Lasix , down 20 lbs. LE venous dopplers negative DVT. Hospitalization complicated by severe  hypokalemia, discharged home on KCL 100 tid, weight 377.7 lbs. Cardiomems goal lowered to 9.  Admitted 11/23 with CHF exacerbation, weight up to 407 lbs, despite normal PADP readings. He was diuresed with IV lasix  and metolazone .  Echo showed EF 55-60%, RV not well-visualized. Discharged home, weight 385 lbs.  Today he returns for HF follow up with his son. Overall feeling fine. He has SOB walking short distances on flat ground. Uses a walker to get around his home. Feels palpitations. Continues with chronic lower extremity edema. Denies abnormal bleeding, CP, dizziness, or PND/Orthopnea. Appetite ok. No fever or chills. Weight at home 286 pounds. Taking all medications. Followed by [facility]. Has not had unna boots in a couple months. Struggles with urinary incontinence.  Cardiomems (personally reviewed from 05/20/23): PADP 11  (goal 9).   ECG (personally reviewed): NSR 91 bpm  PMH: 1. HTN 2. OSA 3. Hyperlipidemia 4. Low back pain 5. PE/DVT in 2/21.  - V/Q scan in 6/21 was not suggestive of chronic PE.  6. Nonalcoholic steatohepatitis.  7. GI  bleed in 10/20.  8. Anxiety 9. CAD: Coronary CTA in 7/21 with calcium score 0 but concern for soft plaque distal CFX and RCA.  By [facility], there was flow-limiting stenosis in distal CFX and distal RCA.  10. Chronic diastolic CHF: Echo in 4/21 with EF 55-60%, RV poorly visualized.  - RHC (4/21): mean RA 8, PA 20/8, mean PCWP 11, CI 2.8 - Echo  (12/21): EF 60-65%, grade 2 diastolic dysfunction, RV poorly visualized.   - RHC (8/22) normal filling pressures, RA mean 7, RV 29/9, PA 32/15, mean 22, PCWP mean 13, CO/CI 7.32/2.41. - Cardiomems (8/22) placement. - Echo (3/23): EF 60-65%, mild LVH, normal RV - Echo (11/23): EF 55-60%, RV not well-visualized.  11. PFTs (4/21): Mild obstruction.  12. ABIs (10/21): Normal. 13. Fe deficiency anemia.   Review of Systems: All systems reviewed and negative except as per HPI.   Current Outpatient Medications  Medication Sig Dispense Refill   apixaban  (ELIQUIS ) 5 MG TABS tablet Take 1 tablet (5 mg total) by mouth 2 (two) times daily. 60 tablet 3   bacitracin  ointment Apply topically daily. 120 g 0   eplerenone  (INSPRA ) 50 MG tablet TAKE TWO TABLETS BY MOUTH DAILY 60 tablet 5   ferrous sulfate  325 (65 FE) MG tablet Take 1 tablet (325 mg total) by mouth daily with breakfast. 30 tablet 0   gabapentin  (NEURONTIN ) 800 MG tablet Take 1 tablet (800 mg total) by mouth 3 (three) times daily. 15 tablet 0   hydrocerin (EUCERIN) CREA Apply 1 Application topically 2 (two) times daily.     lactulose , encephalopathy, (CHRONULAC ) 10 GM/15ML SOLN Take 30 mLs (20 g total) by mouth 2 (two) times daily. (Patient taking differently: Take 20 g by mouth 2 (two) times daily as needed.)     methocarbamol  (ROBAXIN ) 500 MG tablet Take 1 tablet (500 mg total) by mouth 3 (three) times daily. 30 tablet 0   metolazone  (ZAROXOLYN ) 2.5 MG tablet Take 1 tablet (2.5 mg total) by mouth 3 (three) times a week. on Mondays Wednesdays and Fridays 15 tablet 3   metoprolol  succinate (TOPROL -XL) 25 MG 24 hr tablet TAKE ONE TABLET BY MOUTH AT BEDTIME 30 tablet 11   ondansetron  (ZOFRAN -ODT) 4 MG disintegrating tablet Take 4 mg by mouth every 8 (eight) hours as needed for nausea or vomiting (dissolve orally).     oxyCODONE  (ROXICODONE ) 15 MG immediate release tablet Take 1 tablet (15 mg total) by mouth every 4 (four) hours as needed for severe  pain (pain score 7-10) or moderate pain (pain score 4-6). 30 tablet 0   pantoprazole  (PROTONIX ) 40 MG tablet Take 40 mg by mouth 2 (two) times daily before a meal.     potassium chloride  (KLOR-CON ) 20 MEQ packet Take 100 mEq by mouth 3 (three) times daily. Take an extra 40 mEq on days when you take metolazone  475 packet 3   pravastatin  (PRAVACHOL ) 20 MG tablet Take 1 tablet by mouth daily. (Patient taking differently: Take 20 mg by mouth at bedtime.) 30 tablet 11   sertraline  (ZOLOFT ) 100 MG tablet Take 100 mg by mouth at bedtime. At night     tamsulosin  (FLOMAX ) 0.4 MG CAPS capsule Take 1 capsule (0.4 mg total) by mouth daily. Additional refills will need to come from PCP 30 capsule 0   torsemide  (DEMADEX ) 100 MG tablet TAKE ONE TABLET BY MOUTH DAILY IN THE MORNING AND EVENING 90 tablet 3   triamcinolone  (KENALOG ) 0.025 % cream Apply 1 Application topically 3 (three)  times daily as needed (skin irritation).     VENTOLIN  HFA 108 (90 Base) MCG/ACT inhaler Inhale 1 puff into the lungs every 4 (four) hours as needed for shortness of breath. (Patient taking differently: Inhale 2 puffs into the lungs every 4 (four) hours as needed for shortness of breath.) 6.7 g 1   zolpidem  (AMBIEN ) 10 MG tablet Take 1 tablet (10 mg total) by mouth at bedtime as needed for sleep. 5 tablet 0   No current facility-administered medications for this visit.   Allergies  Allergen Reactions   Penicillins Other (See Comments)    Caused convulsions as a child  Did it involve swelling of the face/tongue/throat, SOB, or low BP? N/A Did it involve sudden or severe rash/hives, skin peeling, or any reaction on the inside of your mouth or nose? N/A Did you need to seek medical attention at a hospital or doctor's office?N/A When did it last happen? N/A If all above answers are NO, may proceed with cephalosporin use.   Social History   Socioeconomic History   Marital status: Single    Spouse name: Not on file   Number of  children: Not on file   Years of education: Not on file   Highest education level: Not on file  Occupational History   Not on file  Tobacco Use   Smoking status: Never   Smokeless tobacco: Never  Vaping Use   Vaping status: Never Used  Substance and Sexual Activity   Alcohol use: Not Currently   Drug use: Not Currently    Types: Marijuana    Comment: Smoked for 30 years   Sexual activity: Not on file  Other Topics Concern   Not on file  Social History Narrative   Not on file   Social Drivers of Health   Financial Resource Strain: Medium Risk (11/15/2020)   Overall Financial Resource Strain (CARDIA)    Difficulty of Paying Living Expenses: Somewhat hard  Food Insecurity: No Food Insecurity (11/02/2023)   Hunger Vital Sign    Worried About Running Out of Food in the Last Year: Never true    Ran Out of Food in the Last Year: Never true  Transportation Needs: Unmet Transportation Needs (11/02/2023)   PRAPARE - Administrator, Civil Service (Medical): Yes    Lack of Transportation (Non-Medical): Yes  Physical Activity: Not on file  Stress: Not on file  Social Connections: Not on file  Intimate Partner Violence: Unknown (11/02/2023)   Humiliation, Afraid, Rape, and Kick questionnaire    Fear of Current or Ex-Partner: No    Emotionally Abused: No    Physically Abused: Not on file    Sexually Abused: No    Family History  Problem Relation Age of Onset   Hyperlipidemia Mother    Hypertension Mother    Stroke Mother    CAD Maternal Grandmother    CAD Maternal Grandfather    There were no vitals taken for this visit.  Wt Readings from Last 3 Encounters:  10/24/23 (!) 138.3 kg (305 lb)  10/19/23 (!) 139.3 kg (307 lb)  10/05/23 (!) 137.4 kg (303 lb)   Physical Exam  GENERAL: NAD Lungs- *** CARDIAC:  JVP: *** cm          Normal rate with regular rhythm. *** murmur.  Pulses ***. *** edema.  ABDOMEN: Soft, non-tender, non-distended.  EXTREMITIES: Warm and well  perfused.  NEUROLOGIC: No obvious FND   ASSESSMENT & PLAN: 1. Chronic diastolic CHF with  suspected RV dysfunction: Echo 7/22 with EF 60-65%, mild LVH, normal RV, unable to estimate PA systolic pressure. Multiple CHF admissions despite aggressive outpatient diuretic regimen and paramedicine.  RHC (7/22) with optimized filling pressures, PCWP 13, CO/CI 7.3/2.4. RHC (8/22) with normal filling pressures; underwent placement of Cardiomems (dPAP goal 9 mmHg).  Echo 11/23 with EF 55-60%, RV not well-visualized.  Chronic NYHA *** symptoms, functional class confounded by body habitus and chronic lymphedema. Volume Cardiomems ***. Significant venous insufficiency.  - Will see if we can arrange unna boots.  - Continue torsemide  100 mg bid + metolazone  2.5 mg/extra 40 KCL 3 days/week. BMET today. - He struggles with urinary incontinence and needs urinary supplies throughout the day in order to be compliant with his diuretic regimen. - Continue 100 KCL tid. - Continue eplerenone  100 mg daily.  - Continue Toprol  XL 25 mg qhs.  - GI upset with Jardiance  and Farxiga .  2. H/o PE/DVT: Suspect due to sedentary lifestyle.  Occurred in 2/21.  V/Q scan in 6/21 did not show evidence for chronic PE.   - Continue Eliquis .  3. OSA: Cannot tolerate CPAP. - No change. 4. Anxiety/depression: This is a major issue.   - Continue sertraline  100 mg daily.  5. Hypertension: *** - Continue current BP active meds 6. Atrial fibrillation: Paroxysmal. *** - Continue Eliquis   - Continue Toprol  XL. 7. Cirrhosis: CT scan in past noted nodular contour of liver, compatible with underlying cirrhosis. In setting of known RV dysfunction and suspect cardiogenic. LFTs ok. EGD 12/22 showed portal hypertensive gastropathy, no varices.  - On lactulose  (also takes this for chronic constipation)    Continue paramedicine, appreciate their help.   Follow up ***   Caffie Shed PA-C 12/22/23

## 2023-12-23 ENCOUNTER — Encounter (HOSPITAL_COMMUNITY)

## 2023-12-23 ENCOUNTER — Encounter (HOSPITAL_COMMUNITY): Payer: Self-pay

## 2024-01-18 NOTE — Consults (Signed)
 ------------------------------------------------------------------------------- Attestation signed by Lonni Jackquline Ned, MD at 01/18/2024  9:20 PM I saw and evaluated the patient and reviewed the resident's note. I agree with the resident's findings and plan.  History as noted below. I have ordered/reviewed labs and OSH imaging. In addition, the care of this patient has been discussed with emergency department and interventional radiology. The management of this patient continues to have a high risk of morbidity given active extravasation on CT scan with potential for ongoing bleeding.  Briefly, 68 yo M w/ hx of afib and DVT/PE on eliquis  who presented to OSH with swelling in his left lower extremity.  He states this started at his facility after getting a shower this AM.  The patient states it grew in size, however since being wrapped at the other hospital it has slowed.  Denies any other symptoms.  CT at OSH with active extrav in subcutaneous tissues.  Hg 11.8, hemodynamically stable.  Will admit the patient for serial labs and exams, size of hematoma marked on skin, patient has signals in his foot which is believed to be baseline, no concern for compartment syndrome on exam.  IR consulted given active extrav, recommend trending labs and exams, they will re-evaluate in AM.     Electronically Signed by: Lonni Jackquline Ned, MD 01/18/2024 9:17 PM  -------------------------------------------------------------------------------  Emergency General Surgery Consult Note  Chief Complaint/Reason for Consult: LLE hematoma  HPI:   Charles Gray is a 68 y.o. male with PMH significant for paroxsymal Afib, COPD, cirrhosis (on lactulose ), DVT/PE in 2021 (on Eliquis ), chronic dHF (EF 55-60% in 2023), HTN, OSA, HLD, low back pain, anxiety, CAD and iron  deficiency anemia who presented to OSH on Tuesday 01/18/2024 with a left lower extremity hematoma. He states that he resides at [facility]  and is rehabilitating after hospital admission. He reports that his leg began to swell at 8:00 this AM, although he's uncertain if it was due to an injury or spontaneous recurrence. Doesn't know if he may have hit it on the shower chair. However, the swelling was sudden and severe, causing significant pain and nausea. He intially presented to OSH, found to have active extrave on CTA and was therefore transferred to [facility] for a higher level o f care. He denieslightheadedness or dizziness. On evaluation, patient is afebrile and hemodynamically stable, saturating well on room air. He last ate/drank this AM.  Most recent labs notable for WBC 109, Hgb 11.8, plts 127, Cr 0.71. CTA of LLE demonstrated 21x11cm large hematoma contained within the subcutaneous tissue of the calf without involvement of deep compartments and demonstrating active contrast extravasation.  Current anticoagulation/antiplatelet/steroid medication: Eliquis  (taking for Afib). 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   PAST MEDICAL HISTORY:  1. HTN 2. OSA 3. Hyperlipidemia 4. Low back pain 5. PE/DVT in 2/21.  - V/Q scan in 6/21 was not suggestive of chronic PE.  6. Nonalcoholic steatohepatitis.  7. GI bleed in 10/20.  8. Anxiety 9. CAD: Coronary CTA in 7/21 with calcium score 0 but concern for soft plaque distal CFX and RCA. By [facility], there was flow-limiting stenosis in distal CFX and distal RCA.  10. Chronic diastolic CHF: Echo in 4/21 with EF 55-60%, RV poorly visualized.  - RHC (4/21): mean RA 8, PA 20/8, mean PCWP 11, CI 2.8 - Echo (12/21): EF 60-65%, grade 2 diastolic dysfunction, RV poorly visualized.  - RHC (8/22) normal filling pressures, RA mean 7,  RV 29/9, PA 32/15, mean 22, PCWP mean 13, CO/CI 7.32/2.41. - Cardiomems (8/22) placement. - Echo (3/23): EF 60-65%, mild LVH, normal RV - Echo (11/23): EF 55-60%, RV not well-visualized.  11. PFTs (4/21):  Mild obstruction.  12. ABIs (10/21): Normal. 13. Fe deficiency anemia.   PAST SURGICAL HISTORY: Pt reports remove history of bowel surgery from mesenteric ischemia 2/2 blood clots  PAST FAMILY HISTORY: None significant.  SOCIAL HISTORY:  Tobacco Use: Denies EtOH Use: Denies Recreational Drug Use: Prior Marijuana use  SDOH: He is worried that he will lose his apartment if he has to go back to rehab for a long period of time.   ALLERGIES: Reviewed, see below Allergies  Allergen Reactions  . Penicillin Other (See Comments)    Convulsions    HOME MEDICATIONS: Pending med rec apixaban  (ELIQUIS ) 5 MG TABS tablet Take 1 tablet (5 mg total) by mouth 2 (two) times daily. 60 tablet 3  Buprenorphine  HCl-Naloxone  HCl 8-2 MG FILM Place 1 Film under the tongue in the morning and at bedtime.  cariprazine (VRAYLAR) 1.5 MG capsule Take 1.5 mg by mouth daily. Per Dr. Elaine  eplerenone  (INSPRA ) 50 MG tablet Take 2 tablets (100 mg total) by mouth daily. 60 tablet 11  gabapentin  (NEURONTIN ) 800 MG tablet Take 1 tablet (800 mg total) by mouth 3 (three) times daily. 90 tablet 1  lactulose , encephalopathy, (CHRONULAC ) 10 GM/15ML SOLN Take 30 mLs (20 g total) by mouth 2 (two) times daily. (Patient taking differently: Take 15 g by mouth 2 (two) times daily.)  metolazone  (ZAROXOLYN ) 2.5 MG tablet Take 1 tablet (2.5 mg total) by mouth 3 (three) times a week. (Patient taking differently: Take 2.5 mg by mouth 3 (three) times a week. on Mondays Wednesdays and Fridays) 12 tablet 1  metoprolol  succinate (TOPROL -XL) 25 MG 24 hr tablet TAKE ONE TABLET BY MOUTH AT BEDTIME 30 tablet 11  ondansetron  (ZOFRAN -ODT) 4 MG disintegrating tablet Take 4 mg by mouth every 8 (eight) hours as needed for nausea or vomiting (dissolve orally).  pantoprazole  (PROTONIX ) 40 MG tablet Take 40 mg by mouth 2 (two) times daily before a meal.  polyethylene glycol (MIRALAX  / GLYCOLAX ) 17 g packet Take 17 g by mouth as needed for mild  constipation (mix and drink as directed).  potassium chloride  (KLOR-CON ) 20 MEQ packet Take 100 mEq by mouth 3 (three) times daily. Take an extra 40 mEq on days when you take metolazone  475 packet 3  pravastatin  (PRAVACHOL ) 20 MG tablet Take 1 tablet (20 mg total) by mouth daily. 30 tablet 11  sertraline  (ZOLOFT ) 100 MG tablet Take 100 mg by mouth daily. At night  tamsulosin  (FLOMAX ) 0.4 MG CAPS capsule Take 1 capsule (0.4 mg total) by mouth daily. Additional refills will need to come from PCP 30 capsule 0  torsemide  (DEMADEX ) 100 MG tablet TAKE ONE TABLET BY MOUTH DAILY IN THE MORNING AND EVENING 90 tablet 3  triamcinolone  (KENALOG ) 0.025 % ointment Apply 1 application. topically 2 (two) times daily. 30 g 0  VENTOLIN  HFA 108 (90 Base) MCG/ACT inhaler Inhale 1 puff into the lungs every 4 (four) hours as needed for shortness of breath. (Patient taking differently: Inhale 2 puffs into the lungs every 4 (four) hours as needed for shortness of breath.) 6.7 g 1  zolpidem  (AMBIEN ) 10 MG tablet Take 1 tablet (10 mg total) by mouth at bedtime as needed for sleep. 30 tablet 0   Review of Systems A complete ROS was performed with pertinent positives/negatives  noted in the HPI. The remainder of the ROS are negative.  Objective: Vital Signs: Temp:  [98.6 F (37 C)] 98.6 F (37 C) Heart Rate:  [84] 84 Resp:  [17-22] 22 BP: (126)/(71) 126/71 O2 Device: None (Room air)      I&O No intake/output data recorded. No intake/output data recorded. No intake or output data in the 24 hours ending 01/18/24 1929  Physical Exam General: male, no apparent distress, alert and cooperative  HEENT: normocephalic, atraumatic, moist mucous membranes  Cardiovascular: Hemodynamically stable  Chest/Lungs: Non-labored breathing, oxygenating well on room air  Abdomen: Soft, non-tender, non-distended   Extremities: No gross deformities  LLE:      Skin: Warm and dry  GU: No foley  Neuro: no gross deficits     LABS: Recent Results (from the past 24 hours)  Comprehensive Metabolic Panel   Collection Time: 01/18/24  6:24 PM  Result Value Ref Range   Sodium 142 136 - 145 mmol/L   Potassium 4.4 3.5 - 5.1 mmol/L   Chloride 109 (H) 98 - 107 mmol/L   CO2 26 21 - 31 mmol/L   Anion Gap 7 6 - 14 mmol/L   Glucose, Random 92 70 - 99 mg/dL   Blood Urea Nitrogen (BUN) 17 7 - 25 mg/dL   Creatinine 9.28 9.29 - 1.30 mg/dL   eGFR >09 >40 fO/fpw/8.26f7   Albumin  3.5 3.5 - 5.7 g/dL   Total Protein 5.8 (L) 6.4 - 8.9 g/dL   Bilirubin, Total 0.8 0.3 - 1.0 mg/dL   Alkaline Phosphatase (ALP) 82 34 - 104 U/L   Aspartate Aminotransferase (AST) 12 (L) 13 - 39 U/L   Alanine Aminotransferase (ALT) 7 7 - 52 U/L   Calcium 8.5 (L) 8.6 - 10.3 mg/dL   BUN/Creatinine Ratio    Prothrombin Time (PT) with INR   Collection Time: 01/18/24  6:24 PM  Result Value Ref Range   Prothrombin Time (PT) 10.4 8.9 - 12.1 seconds   International Normalized Ratio (INR) 0.9 <5.0  CBC with Differential   Collection Time: 01/18/24  6:24 PM  Result Value Ref Range   WBC 10.90 4.40 - 11.00 10*3/uL   RBC 4.65 4.50 - 5.90 10*6/uL   Hemoglobin 11.8 (L) 14.0 - 17.5 g/dL   Hematocrit 63.6 (L) 58.4 - 50.4 %   Mean Corpuscular Volume (MCV) 78.1 (L) 80.0 - 96.0 fL   Mean Corpuscular Hemoglobin (MCH) 25.4 (L) 27.5 - 33.2 pg   Mean Corpuscular Hemoglobin Conc (MCHC) 32.5 (L) 33.0 - 37.0 g/dL   Red Cell Distribution Width (RDW) 19.6 (H) 12.3 - 17.0 %   Platelet Count (PLT) 127 (L) 150 - 450 10*3/uL   Mean Platelet Volume (MPV) 8.6 6.8 - 10.2 fL   Neutrophils % 85 %   Lymphocytes % 8 %   Monocytes % 6 %   Eosinophils % 1 %   Basophils % 0 %   nRBC % 0 %   Neutrophils Absolute 9.30 (H) 1.80 - 7.80 10*3/uL   Lymphocytes # 0.90 (L) 1.00 - 4.80 10*3/uL   Monocytes # 0.70 0.00 - 0.80 10*3/uL   Eosinophils # 0.10 0.00 - 0.50 10*3/uL   Basophils # 0.00 0.00 - 0.20 10*3/uL   nRBC Absolute 0.00 <=0.00 10*3/uL  Magnesium    Collection Time:  01/18/24  6:24 PM  Result Value Ref Range   Magnesium  2.0 1.9 - 2.7 mg/dL  Phosphorus   Collection Time: 01/18/24  6:24 PM  Result Value Ref Range   Phosphorus  4.4 2.5 - 5.0 mg/dL    Review of Ancillary Data: CT Outside Images Non Result  Final Result by SYSTEMGENERATED, DOCUMENTATION (10/07 1851)      Assessment: Charles Gray is a 69 y.o. male anticoagulated on Eliquis  who presents to [facility] with active extravasation from a LLE hematoma. EGS consulted for further workup and evaluation.    Recommendations: - Admit to EGS - Consult to IR given active extrav - NPO with mIVF for now - Follow Q6H CBC - Continue compression to LLE - Please page EGS with any questions/concerns  Patient discussed with EGS attending, Dr. Debby, who directed the plan of care.    Electronically signed by:  Powell Karna Areas, MD 01/18/2024 7:06 PM

## 2024-01-18 NOTE — Consults (Signed)
 Interventional Radiology Consult Note / History & Physical  Requesting Physician/Service: ED Contact: Secure Chat   Indication for Consult: Hemorrhage   Procedure Requested: Arteriography   Assessment: Charles Gray is a 68 y.o. male. He has a history of spontaneous left calf bleed into the subcutaneous fat directly subjacent the skin in multiple locations. The patient is on anticoagulation which has been reversed. The hematoma has been marked and is not changing in size. There is a pressure dressing on the hematoma.  The patient's care team is requesting arteriography.  Plan: -No procedure at this time -Monitor hematoma size and Hb for the next 12 hours at least -Suspect this will continued be stable since the underlying pathology has been corrected by reversing the patient's antiocoagulation -This case was discussed with the on call IR attending, Dr. Court.     History:    Relevant Imaging:  CT from today.  All imaging has been independently interpreted and reviewed with communication with the diagnostic radiology department as needed on a per case basis.    Relevant IR History: None.  Allergies:  Allergies[1]  Contrast Allergy: No  Past Medical History: Medical History[2] Surgical History[3]  Medications:  Current Rx ordered in [facility][4]  Relevant Labs: International Normalized Ratio (INR)  Date Value Ref Range Status  01/18/2024 0.9 <5.0 Final   WBC  Date Value Ref Range Status  01/18/2024 10.90 4.40 - 11.00 10*3/uL Final   Hemoglobin  Date Value Ref Range Status  01/18/2024 11.8 (L) 14.0 - 17.5 g/dL Final   Hematocrit  Date Value Ref Range Status  01/18/2024 36.3 (L) 41.5 - 50.4 % Final   Platelet Count (PLT)  Date Value Ref Range Status  01/18/2024 127 (L) 150 - 450 10*3/uL Final   Potassium  Date Value Ref Range Status  01/18/2024 4.4 3.5 - 5.1 mmol/L Final    Comment:    NO VISIBLE HEMOLYSIS   Creatinine  Date Value Ref Range  Status  01/18/2024 0.71 0.70 - 1.30 mg/dL Final   Albumin   Date Value Ref Range Status  01/18/2024 3.5 3.5 - 5.7 g/dL Final   Bilirubin, Total  Date Value Ref Range Status  01/18/2024 0.8 0.3 - 1.0 mg/dL Final     Physical Exam:   Relevant Physical Exam: Exam deferred. Relevant clinical information ascertained from imaging interpretation.  Vitals:  Patient Vitals for the past 24 hrs:  BP Temp Temp src Pulse Resp SpO2 Height Weight  01/18/24 1837 -- -- -- -- (!) 22 -- -- --  01/18/24 1758 126/71 98.6 F (37 C) Oral 84 17 97 % 2.083 m (6' 10) (!) 138 kg (305 lb)      Code Status: Full Code  Male Patients: No LMP for male patient.         [1] Allergies Allergen Reactions  . Penicillin Other (See Comments)    Convulsions  [2] No past medical history on file. [3] No past surgical history on file. [4] Current Facility-Administered Medications Ordered in [facility]  Medication Dose Route Frequency Provider Last Rate Last Admin  . acetaminophen  (TYLENOL ) tablet 1,000 mg  1,000 mg oral Q6H SCH Heather Karna Ots, MD      . dextrose  (D50W) 50 % injection 12.5 g  12.5 g intravenous PRN Powell Karna Areas, MD      . dextrose  (TRUEPLUS GLUCOSE) 15 gram/32 mL oral gel 15 g  15 g oral PRN Powell Karna Areas, MD      . NOREEN ON 01/19/2024] enoxaparin (LOVENOX)  syringe 40 mg  40 mg subcutaneous At Bedtime Powell Karna Areas, MD      . lactated ringer 's infusion  75 mL/hr intravenous Continuous Powell Karna Areas, MD      . ondansetron  (ZOFRAN ) injection 4 mg  4 mg intravenous Q6H PRN Powell Karna Areas, MD      . oxyCODONE  (ROXICODONE ) immediate release tablet 5 mg  5 mg oral Q6H PRN Powell Karna Areas, MD       Or  . oxyCODONE  (ROXICODONE ) immediate release tablet 10 mg  10 mg oral Q6H PRN Powell Karna Areas, MD      . NOREEN ON 01/19/2024] polyethylene glycol (GLYCOLAX ) packet 17 g  17 g oral Daily Powell Karna Areas, MD      . NOREEN ON 01/19/2024] senna (SENOKOT) tablet 8.6  mg  1 tablet oral BID Powell Karna Areas, MD       Meds Ordered in [facility]  Medication Sig Dispense Refill  . potassium chloride  20 mEq ER tablet Take 100 mEq by mouth 3 (three) times a day.

## 2024-01-18 NOTE — ED Notes (Signed)
 RN with patient at bedside.    Charles Gray 01/18/24 2026

## 2024-01-21 NOTE — Discharge Summary (Signed)
 Surgery Discharge Summary  Patient ID: Charles Gray [id number] 68 y.o. 09/05/1955  Admit date: 01/18/2024 Admitting Physician: Lonni Jackquline Ned, MD Admission Condition: fair Admission Diagnoses: Hematoma  Discharge date: 01/21/24 Discharge Physician: Dr. Kathryne Discharged Condition: good Discharge Diagnoses: Principal Problem:   Hematoma Resolved Problems:   * No resolved hospital problems. *   Procedures/Surgeries performed during hospitalization:  No admission procedures for hospital encounter.    Indication for Admission:  Non-operative management of left lower extremity hematoma  Hospital Course:   [name] is a 68 year old male with significant past medical history of  paroxsymal Afib, COPD, cirrhosis, DVT/PE in 2021 (on Eliquis ), chronic dHF (EF 55-60% in 2023), HTN, OSA, HLD, low back pain, anxiety, CAD, prior GI bleed, and iron  deficiency anemia, as well as significant past surgical history of mesenteric ischemia resulting in thrombus and subsequent surgery (remote, patient unable to provide more information), who presented to [facility] ED as a transfer patient from [facility] with left lower extremity pain and swelling for 1 day. He is unsure if he hit his leg when in the shower, but reports sudden bruise, pain and swelling. Workup at the outside hospital was significant for WBC 109, Hgb 11.8, plts 127, Cr 0.71. CTA of LLE demonstrated 21x11cm large hematoma contained within the subcutaneous tissue of the calf without involvement of deep compartments and demonstrating active contrast extravasation.   On arrival to [facility] ED,was afebrile and hemodynamically stable. Left lower leg with diffuse hematoma with serous blisters, but no concern for compartment syndrome or neurovascular deficit. Given these findings, Emergency General Surgery was consulted for further evaluation and management. Upon bedside evaluation and consultation of  patient by Emergency General Surgery, discussion was had with patient about need for admission to hospital, consultation to Interventional Radiology for possible embolization, and close monitoring of hematoma.  Patient agreed with and wanted to pursue medical plan.  Interventional Radiology was consulted but did not pursue embolization in setting of hemodynamic stability, no further expansion of the hematoma, and concern that the bleeding vessel supply the skin, and embolization would result in skin necrosis. As such, he was managed with supportive care including compression wrap, elevation, and close monitoring.  Patient experienced no further expansion of hematoma. Hemoglobin slowly drifted down before stabilizing, as expected. He remained neurovascularly intact. He worked with physical therapy, who recommended skilled nursing rehab. He endorsed ability to tolerate regular diet, void, and pass flatus without difficulty. He met all medical and surgical milestones for discharge from acute care setting.    At time of discharge, patient was afebrile and hemodynamically stable.  He remained stable without new pain, swelling, of worsening anemia. He continued to tolerate regular food without symptoms.  Discussion was had with patient about the following discharge plans: medication (pain medication, holding eliquis ), wound care (ace wrap with bacitracin  to unroofed blisters), diet, activity/restrictions, follow up appointments and reasons to return to the hospital.  Patient was amenable to the discharge plans.     Treatments: Compression wrap  Discharge Exam: GENERAL: No acute distress, alert and oriented   HEENT: Normocephalic, atraumatic, sclerae anicteric, trachea midline  CARDIOVASCULAR: Regular rate, hemodynamically stable  PULMONARY: Normal work of breathing, good chest rise and fall ABDOMEN: Soft, non-tender, non-distended. No guarding or rebound tenderness.  EXTREMITIES: large left lower leg  hematoma with serous blisters (some unroofed). Dependent edema of the left foot SKIN: Warm, dry, intact  Disposition: SNF  Patient Instructions:  You were admitted to the hospital  for non-operative management of left lower leg hematoma.  You did NOT require surgery during this hospitalization.   Medications and Pain management: You can resume your home medications as prescribed by your primary care provider with the following exceptions STOP taking your eliquis  until you follow up with your primary care doctor. You will need to have a conversation about risks/benefits about re-starting the blood thinner I have increased your muscle relaxer to help with muscle spasms We have added tramadol  50 mg, which you can take every 6 hours as needed for breakthrough pain  Hematoma care: Wrap leg with ace wrap, change daily or more often as needed Ok to use bacitracin  on unroofed blisters Keep leg elevated when you are not ambulating The skin will likely slough off, and you may have old blood that comes from the wound. This is expected, and as long as theres no active bleeding or new blood, can continue local wound care   Diet:  no dietary restrictions; eat what you can tolerate; recommend a well-balanced, low fat, low sodium diet  Activity/Restrictions: No heavy lifting greater than 20 lbs You are encouraged to ambulate/walk/move as much as possible each day, just no strenuous exercise   Follow Up appointments: PCP: make an appointment to discuss the risks and benefits of restarting the blood thinner.    CALL FOR: fever > 101.5, inability to control pain with over the counter medication, inability to tolerate liquids, intractable nausea and/or vomiting, or with any additional concerns.  SURGERY CONTACT TELEPHONE NUMBERS                 Call [phone] for all assistance (Monday-Friday daytime working hours)                 Call [phone] for urgent assistance after hours (5:00 PM to 8:00  AM) or on weekends   Incidentals:  none  Discharge Medications:   Medication List     START taking these medications    acetaminophen  500 mg tablet Commonly known as: TYLENOL  Take 2 tablets (1,000 mg total) by mouth every 6 (six) hours for 10 days.   bacitracin  500 unit/gram ointment packet Apply 1 Application topically 2 (two) times a day for 7 days.   traMADoL  50 mg tablet Commonly known as: ULTRAM  Take 1 tablet (50 mg total) by mouth every 6 (six) hours as needed for severe pain (7-10) (breakthrough pain).       CHANGE how you take these medications    methocarbamoL  750 mg tablet Commonly known as: ROBAXIN  Take 1 tablet (750 mg total) by mouth 3 (three) times a day. What changed:  medication strength how much to take       CONTINUE taking these medications    albuterol  HFA 90 mcg/actuation inhaler Commonly known as: PROVENTIL  HFA;VENTOLIN  HFA;PROAIR  HFA Inhale 2 puffs every 6 (six) hours as needed for wheezing.   ascorbic acid 500 mg tablet Commonly known as: VITAMIN C Take 500 mg by mouth daily.   ferrous sulfate  325 mg (65 mg iron ) tablet Take 325 mg by mouth daily before breakfast.   folic acid 1 mg tablet Commonly known as: FOLVITE Take 1 mg by mouth daily.   gabapentin  800 mg tablet Commonly known as: NEURONTIN  Take 800 mg by mouth 3 (three) times a day.   lactulose  10 gram/15 mL solution Commonly known as: CHRONULAC  Take 30 mL by mouth daily as needed.   magnesium  oxide 400 mg (241 mg magnesium ) Tab Take 400 mg by  mouth daily.   metoprolol  succinate 25 mg 24 hr tablet Commonly known as: TOPROL  XL Take 25 mg by mouth daily.   omeprazole 20 mg DR capsule Commonly known as: PriLOSEC Take 20 mg by mouth daily.   ondansetron  8 mg tablet Commonly known as: ZOFRAN  Take 8 mg by mouth every 8 (eight) hours as needed for nausea or vomiting.   oxyCODONE  15 mg immediate release tablet Commonly known as: ROXICODONE  Take 15 mg by mouth every  6 (six) hours as needed for moderate pain (4-6).   polyethylene glycol 17 gram packet Commonly known as: GLYCOLAX  Take 17 g by mouth daily as needed for constipation.   potassium chloride  20 mEq ER tablet Take 20 mEq by mouth 3 (three) times a day.   pravastatin  20 mg tablet Commonly known as: PRAVACHOL  Take 20 mg by mouth daily.   sennosides-docusate sodium  8.6-50 mg per tablet Commonly known as: PERICOLACE Take 2 tablets by mouth 2 (two) times a day.   sertraline  100 mg tablet Commonly known as: ZOLOFT  Take 100 mg by mouth daily.   tamsulosin  0.4 mg Cap Commonly known as: FLOMAX  Take 0.4 mg by mouth daily.   torsemide  40 mg Tab Take 40 mg by mouth daily.   zolpidem  10 mg tablet Commonly known as: AMBIEN  Take 10 mg by mouth nightly as needed for sleep.       STOP taking these medications    apixaban  5 mg Tab Commonly known as: ELIQUIS          Where to Get Your Medications     You can get these medications from any pharmacy   Bring a paper prescription for each of these medications traMADoL  50 mg tablet    Information about where to get these medications is not yet available   Ask your nurse or doctor about these medications acetaminophen  500 mg tablet bacitracin  500 unit/gram ointment packet methocarbamoL  750 mg tablet       Time spent on discharge: 50 minutes  Electronically signed by: Marylynn Ronnald Morning, PA-C 01/21/2024 10:02 AM

## 2024-01-24 NOTE — Progress Notes (Incomplete)
 ADVANCED HF CLINIC NOTE  PCP: Sabas Norleen PARAS., MD Primary Cardiologist: Dr. Dub Huntsman, DO  HF Cardiology: Dr. Rolan  HPI: 68 y.o. 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    He had multiple hospitalizations in 2021 for CHF exacerbation, requiring IV Lasix  and change in PO regimen from lasix  to torsemide . Echo 4/21 showed normal LVEF 55-60% w/ G1DD. RV not well visualized, but felt to have component of RV failure. RHC 4/21 showed normal PA pressure and normal cardiac output. PFTs also completed and c/w mild obstructive airway disease.   In 7/21, he had a coronary CTA.  This showed no coronary calcium but there was a concern for soft plaque in the distal LCx and distal RCA with hemodynamically significant stenosis.  He was managed medically. Had not had any chest pain, but was started on ranolazine  after his CTA.  Made his  head fuzzy.    Admitted with A/C diastolic heart failure 01/2020. Diuresed with IV lasix  and transitioned to bumex  3 mg twice a day. Discharge weight 360 pounds.   He was admitted again in 12/21 with CHF.  Echo 12/21 showed EF 60-65%, GIIDD, RV poorly visualized.   Admitted 06/09/20 with A/C diastolic heart failure. Diuresed with IV lasix  + metolazone . Transitioned to torsemide  60 mg twice a day and metolazone  twice a day. D/C weight 364 pounds.   Admitted 5/28//22 with cellulitis. Treated with antibiotics.   Admitted 09/20/20 with  A/C CHF. He diuresed 5 L with IV lasix , weight 371 at discharge.  He was readmitted for A/C CHF 7/22. He was diuresed with IV lasix  + metolazone . RHC showed  optimized filling pressures and preserved CO/CI. He was discharged on torsemide  100 mg bid + metolazone  M/W/F. Discharge weight 374 lbs.  RHC (8/22) showed normal filling pressures & he underwent Cardiomems placement.  Admitted 9/10-9/12/22 for acute encephalopathy, felt secondary  to polypharmacy. Ambien  and gabapentin  were briefly held. Ammonia mildly elevated and he was started on lactulose . Mentation returned to baseline and he was discharged home with Home Health PT/OT.  Saw ID for suspected cellulitis. No active infection identified. Referral to lymphedema clinic submitted.   Admitted 3/23 with a/c dHF and worsening LE edema. Echo showed EF 60-65% RV normal this admission. He was diuresed with IV Lasix , down 20 lbs. LE venous dopplers negative DVT. Hospitalization complicated by severe  hypokalemia, discharged home on KCL 100 tid, weight 377.7 lbs. Cardiomems goal lowered to 9.  Admitted 11/23 with CHF exacerbation, weight up to 407 lbs, despite normal PADP readings. He was diuresed with IV lasix  and metolazone .  Echo showed EF 55-60%, RV not well-visualized. Discharged home, weight 385 lbs.  Today he returns for HF follow up with his son. Overall feeling fine. He has SOB walking short distances on flat ground. Uses a walker to get around his home. Feels palpitations. Continues with chronic lower extremity edema. Denies abnormal bleeding, CP, dizziness, or PND/Orthopnea. Appetite ok. No fever or chills. Weight at home 286 pounds. Taking all medications. Followed by [facility]. Has not had unna boots in a couple months. Struggles with urinary incontinence.  Cardiomems (personally reviewed from 05/20/23): PADP 11  (goal 9).   ECG (personally reviewed): NSR 91 bpm  PMH: 1. HTN 2. OSA 3. Hyperlipidemia 4. Low back pain 5. PE/DVT in 2/21.  - V/Q scan in 6/21 was not suggestive of chronic PE.  6. Nonalcoholic steatohepatitis.  7. GI  bleed in 10/20.  8. Anxiety 9. CAD: Coronary CTA in 7/21 with calcium score 0 but concern for soft plaque distal CFX and RCA.  By [facility], there was flow-limiting stenosis in distal CFX and distal RCA.  10. Chronic diastolic CHF: Echo in 4/21 with EF 55-60%, RV poorly visualized.  - RHC (4/21): mean RA 8, PA 20/8, mean PCWP 11, CI 2.8 - Echo  (12/21): EF 60-65%, grade 2 diastolic dysfunction, RV poorly visualized.   - RHC (8/22) normal filling pressures, RA mean 7, RV 29/9, PA 32/15, mean 22, PCWP mean 13, CO/CI 7.32/2.41. - Cardiomems (8/22) placement. - Echo (3/23): EF 60-65%, mild LVH, normal RV - Echo (11/23): EF 55-60%, RV not well-visualized.  11. PFTs (4/21): Mild obstruction.  12. ABIs (10/21): Normal. 13. Fe deficiency anemia.   Review of Systems: All systems reviewed and negative except as per HPI.   Current Outpatient Medications  Medication Sig Dispense Refill   apixaban  (ELIQUIS ) 5 MG TABS tablet Take 1 tablet (5 mg total) by mouth 2 (two) times daily. 60 tablet 3   bacitracin  ointment Apply topically daily. 120 g 0   eplerenone  (INSPRA ) 50 MG tablet TAKE TWO TABLETS BY MOUTH DAILY 60 tablet 5   ferrous sulfate  325 (65 FE) MG tablet Take 1 tablet (325 mg total) by mouth daily with breakfast. 30 tablet 0   gabapentin  (NEURONTIN ) 800 MG tablet Take 1 tablet (800 mg total) by mouth 3 (three) times daily. 15 tablet 0   hydrocerin (EUCERIN) CREA Apply 1 Application topically 2 (two) times daily.     lactulose , encephalopathy, (CHRONULAC ) 10 GM/15ML SOLN Take 30 mLs (20 g total) by mouth 2 (two) times daily. (Patient taking differently: Take 20 g by mouth 2 (two) times daily as needed.)     methocarbamol  (ROBAXIN ) 500 MG tablet Take 1 tablet (500 mg total) by mouth 3 (three) times daily. 30 tablet 0   metolazone  (ZAROXOLYN ) 2.5 MG tablet Take 1 tablet (2.5 mg total) by mouth 3 (three) times a week. on Mondays Wednesdays and Fridays 15 tablet 3   metoprolol  succinate (TOPROL -XL) 25 MG 24 hr tablet TAKE ONE TABLET BY MOUTH AT BEDTIME 30 tablet 11   ondansetron  (ZOFRAN -ODT) 4 MG disintegrating tablet Take 4 mg by mouth every 8 (eight) hours as needed for nausea or vomiting (dissolve orally).     oxyCODONE  (ROXICODONE ) 15 MG immediate release tablet Take 1 tablet (15 mg total) by mouth every 4 (four) hours as needed for severe  pain (pain score 7-10) or moderate pain (pain score 4-6). 30 tablet 0   pantoprazole  (PROTONIX ) 40 MG tablet Take 40 mg by mouth 2 (two) times daily before a meal.     potassium chloride  (KLOR-CON ) 20 MEQ packet Take 100 mEq by mouth 3 (three) times daily. Take an extra 40 mEq on days when you take metolazone  475 packet 3   pravastatin  (PRAVACHOL ) 20 MG tablet Take 1 tablet by mouth daily. (Patient taking differently: Take 20 mg by mouth at bedtime.) 30 tablet 11   sertraline  (ZOLOFT ) 100 MG tablet Take 100 mg by mouth at bedtime. At night     tamsulosin  (FLOMAX ) 0.4 MG CAPS capsule Take 1 capsule (0.4 mg total) by mouth daily. Additional refills will need to come from PCP 30 capsule 0   torsemide  (DEMADEX ) 100 MG tablet TAKE ONE TABLET BY MOUTH DAILY IN THE MORNING AND EVENING 90 tablet 3   triamcinolone  (KENALOG ) 0.025 % cream Apply 1 Application topically 3 (three)  times daily as needed (skin irritation).     VENTOLIN  HFA 108 (90 Base) MCG/ACT inhaler Inhale 1 puff into the lungs every 4 (four) hours as needed for shortness of breath. (Patient taking differently: Inhale 2 puffs into the lungs every 4 (four) hours as needed for shortness of breath.) 6.7 g 1   zolpidem  (AMBIEN ) 10 MG tablet Take 1 tablet (10 mg total) by mouth at bedtime as needed for sleep. 5 tablet 0   No current facility-administered medications for this visit.   Allergies  Allergen Reactions   Penicillins Other (See Comments)    Caused convulsions as a child  Did it involve swelling of the face/tongue/throat, SOB, or low BP? N/A Did it involve sudden or severe rash/hives, skin peeling, or any reaction on the inside of your mouth or nose? N/A Did you need to seek medical attention at a hospital or doctor's office?N/A When did it last happen? N/A If all above answers are NO, may proceed with cephalosporin use.   Social History   Socioeconomic History   Marital status: Single    Spouse name: Not on file   Number of  children: Not on file   Years of education: Not on file   Highest education level: Not on file  Occupational History   Not on file  Tobacco Use   Smoking status: Never   Smokeless tobacco: Never  Vaping Use   Vaping status: Never Used  Substance and Sexual Activity   Alcohol use: Not Currently   Drug use: Not Currently    Types: Marijuana    Comment: Smoked for 30 years   Sexual activity: Not on file  Other Topics Concern   Not on file  Social History Narrative   Not on file   Social Drivers of Health   Financial Resource Strain: Medium Risk (11/15/2020)   Overall Financial Resource Strain (CARDIA)    Difficulty of Paying Living Expenses: Somewhat hard  Food Insecurity: Low Risk  (01/19/2024)   Received from [facility]   Hunger Vital Sign    Within the past 12 months, you worried that your food would run out before you got money to buy more: Never true    Within the past 12 months, the food you bought just didn't last and you didn't have money to get more. : Never true  Transportation Needs: No Transportation Needs (01/19/2024)   Received from [facility]    In the past 12 months, has lack of reliable transportation kept you from medical appointments, meetings, work or from getting things needed for daily living? : No  Recent Concern: Transportation Needs - Unmet Transportation Needs (11/02/2023)   PRAPARE - Administrator, Civil Service (Medical): Yes    Lack of Transportation (Non-Medical): Yes  Physical Activity: Not on file  Stress: Not on file  Social Connections: Not on file  Intimate Partner Violence: Unknown (11/02/2023)   Humiliation, Afraid, Rape, and Kick questionnaire    Fear of Current or Ex-Partner: No    Emotionally Abused: No    Physically Abused: Not on file    Sexually Abused: No    Family History  Problem Relation Age of Onset   Hyperlipidemia Mother    Hypertension Mother    Stroke Mother    CAD Maternal Grandmother     CAD Maternal Grandfather    There were no vitals taken for this visit.  Wt Readings from Last 3 Encounters:  10/24/23 (!) 138.3  kg (305 lb)  10/19/23 (!) 139.3 kg (307 lb)  10/05/23 (!) 137.4 kg (303 lb)   PHYSICAL EXAM: General:  NAD. No resp difficulty, arrived in [facility], chronically-ill appearing HEENT: Normal Neck: Supple. No JVD. Cor: Regular rate & rhythm. No rubs, gallops or murmurs. Lungs: Clear Abdomen: Soft, obese, nontender, nondistended.  Extremities: No cyanosis, clubbing, rash, 2-3+ BLE chronic edema, + weeping Neuro: Alert & oriented x 3, moves all 4 extremities w/o difficulty. Affect pleasant.  ASSESSMENT & PLAN: 1. Chronic diastolic CHF with suspected RV dysfunction: Echo 7/22 with EF 60-65%, mild LVH, normal RV, unable to estimate PA systolic pressure. Multiple CHF admissions despite aggressive outpatient diuretic regimen and paramedicine.  RHC (7/22) with optimized filling pressures, PCWP 13, CO/CI 7.3/2.4. RHC (8/22) with normal filling pressures; underwent placement of Cardiomems (dPAP goal 9 mmHg).  Echo 11/23 with EF 55-60%, RV not well-visualized. He has lost 60 lbs since last visit.  Chronic NYHA III symptoms, functional class confounded by body habitus and chronic lymphedema. He is not volume overloaded by Cardiomems and no JVD noted. Significant venous insufficiency.  - Will see if we can arrange unna boots.  - Continue torsemide  100 mg bid + metolazone  2.5 mg/extra 40 KCL 3 days/week. BMET today. - He struggles with urinary incontinence and needs urinary supplies throughout the day in order to be compliant with his diuretic regimen. - Continue 100 KCL tid. - Continue eplerenone  100 mg daily.  - Continue Toprol  XL 25 mg qhs.  - GI upset with Jardiance  and Farxiga .  2. H/o PE/DVT: Suspect due to sedentary lifestyle.  Occurred in 2/21.  V/Q scan in 6/21 did not show evidence for chronic PE.   - Continue Eliquis . No bleeding issues. CBC today. 3. OSA: Cannot  tolerate CPAP. - No change. 4. Anxiety/depression: This is a major issue.   - Continue sertraline  100 mg daily.  5. Hypertension: BP well-controlled - Continue current BP active meds 6. Atrial fibrillation: Paroxysmal. NSR today.  Rare symptoms.   - Continue apixaban .  - Continue Toprol  XL. 7. Cirrhosis: CT scan in past noted nodular contour of liver, compatible with underlying cirrhosis. In setting of known RV dysfunction and suspect cardiogenic. LFTs ok. EGD 12/22 showed portal hypertensive gastropathy, no varices.  - On lactulose  (also takes this for chronic constipation)    Continue paramedicine, appreciate their help.   Follow up in 4 months with Dr. Rolan [facility] FNP-BC 01/24/24

## 2024-01-25 ENCOUNTER — Encounter (HOSPITAL_COMMUNITY)

## 2024-01-27 ENCOUNTER — Inpatient Hospital Stay (HOSPITAL_COMMUNITY): Admitting: Anesthesiology

## 2024-01-27 ENCOUNTER — Encounter (HOSPITAL_COMMUNITY)
Admission: EM | Disposition: A | Discharge status: Skilled Nursing Facility | Payer: Self-pay | Source: Skilled Nursing Facility | Attending: Internal Medicine

## 2024-01-27 ENCOUNTER — Encounter (HOSPITAL_COMMUNITY): Payer: Self-pay | Admitting: Internal Medicine

## 2024-01-27 ENCOUNTER — Other Ambulatory Visit: Payer: Self-pay

## 2024-01-27 ENCOUNTER — Inpatient Hospital Stay (HOSPITAL_COMMUNITY)
Admission: EM | Admit: 2024-01-27 | Discharge: 2024-02-04 | DRG: 577 | Disposition: A | Discharge status: Skilled Nursing Facility | Source: Skilled Nursing Facility | Attending: Internal Medicine | Admitting: Internal Medicine

## 2024-01-27 DIAGNOSIS — I5082 Biventricular heart failure: Secondary | ICD-10-CM | POA: Diagnosis present

## 2024-01-27 DIAGNOSIS — Z83438 Family history of other disorder of lipoprotein metabolism and other lipidemia: Secondary | ICD-10-CM

## 2024-01-27 DIAGNOSIS — I5033 Acute on chronic diastolic (congestive) heart failure: Secondary | ICD-10-CM | POA: Diagnosis not present

## 2024-01-27 DIAGNOSIS — D62 Acute posthemorrhagic anemia: Secondary | ICD-10-CM | POA: Diagnosis not present

## 2024-01-27 DIAGNOSIS — Z880 Allergy status to penicillin: Secondary | ICD-10-CM

## 2024-01-27 DIAGNOSIS — Z86718 Personal history of other venous thrombosis and embolism: Secondary | ICD-10-CM

## 2024-01-27 DIAGNOSIS — E782 Mixed hyperlipidemia: Secondary | ICD-10-CM | POA: Diagnosis present

## 2024-01-27 DIAGNOSIS — S8011XA Contusion of right lower leg, initial encounter: Secondary | ICD-10-CM | POA: Diagnosis present

## 2024-01-27 DIAGNOSIS — T148XXA Other injury of unspecified body region, initial encounter: Secondary | ICD-10-CM | POA: Diagnosis present

## 2024-01-27 DIAGNOSIS — S8010XA Contusion of unspecified lower leg, initial encounter: Secondary | ICD-10-CM | POA: Diagnosis present

## 2024-01-27 DIAGNOSIS — S81802A Unspecified open wound, left lower leg, initial encounter: Secondary | ICD-10-CM

## 2024-01-27 DIAGNOSIS — E876 Hypokalemia: Secondary | ICD-10-CM | POA: Diagnosis present

## 2024-01-27 DIAGNOSIS — I110 Hypertensive heart disease with heart failure: Secondary | ICD-10-CM | POA: Diagnosis present

## 2024-01-27 DIAGNOSIS — L89893 Pressure ulcer of other site, stage 3: Secondary | ICD-10-CM | POA: Diagnosis present

## 2024-01-27 DIAGNOSIS — I872 Venous insufficiency (chronic) (peripheral): Secondary | ICD-10-CM | POA: Diagnosis not present

## 2024-01-27 DIAGNOSIS — N400 Enlarged prostate without lower urinary tract symptoms: Secondary | ICD-10-CM | POA: Diagnosis present

## 2024-01-27 DIAGNOSIS — Z7901 Long term (current) use of anticoagulants: Secondary | ICD-10-CM | POA: Diagnosis not present

## 2024-01-27 DIAGNOSIS — D6832 Hemorrhagic disorder due to extrinsic circulating anticoagulants: Secondary | ICD-10-CM | POA: Diagnosis present

## 2024-01-27 DIAGNOSIS — S8012XA Contusion of left lower leg, initial encounter: Principal | ICD-10-CM | POA: Diagnosis present

## 2024-01-27 DIAGNOSIS — F32A Depression, unspecified: Secondary | ICD-10-CM | POA: Diagnosis present

## 2024-01-27 DIAGNOSIS — E66812 Obesity, class 2: Secondary | ICD-10-CM | POA: Diagnosis present

## 2024-01-27 DIAGNOSIS — X58XXXA Exposure to other specified factors, initial encounter: Secondary | ICD-10-CM | POA: Diagnosis present

## 2024-01-27 DIAGNOSIS — I890 Lymphedema, not elsewhere classified: Secondary | ICD-10-CM

## 2024-01-27 DIAGNOSIS — G894 Chronic pain syndrome: Secondary | ICD-10-CM | POA: Diagnosis present

## 2024-01-27 DIAGNOSIS — I5032 Chronic diastolic (congestive) heart failure: Secondary | ICD-10-CM | POA: Diagnosis present

## 2024-01-27 DIAGNOSIS — I480 Paroxysmal atrial fibrillation: Secondary | ICD-10-CM

## 2024-01-27 DIAGNOSIS — S8012XD Contusion of left lower leg, subsequent encounter: Secondary | ICD-10-CM | POA: Diagnosis not present

## 2024-01-27 DIAGNOSIS — Z6832 Body mass index (BMI) 32.0-32.9, adult: Secondary | ICD-10-CM

## 2024-01-27 DIAGNOSIS — D696 Thrombocytopenia, unspecified: Secondary | ICD-10-CM | POA: Diagnosis present

## 2024-01-27 DIAGNOSIS — J449 Chronic obstructive pulmonary disease, unspecified: Secondary | ICD-10-CM | POA: Diagnosis present

## 2024-01-27 DIAGNOSIS — Z8249 Family history of ischemic heart disease and other diseases of the circulatory system: Secondary | ICD-10-CM

## 2024-01-27 DIAGNOSIS — Z79899 Other long term (current) drug therapy: Secondary | ICD-10-CM | POA: Diagnosis not present

## 2024-01-27 DIAGNOSIS — Z86711 Personal history of pulmonary embolism: Secondary | ICD-10-CM

## 2024-01-27 DIAGNOSIS — I96 Gangrene, not elsewhere classified: Secondary | ICD-10-CM | POA: Diagnosis present

## 2024-01-27 DIAGNOSIS — Z59868 Other specified financial insecurity: Secondary | ICD-10-CM

## 2024-01-27 DIAGNOSIS — M79662 Pain in left lower leg: Secondary | ICD-10-CM

## 2024-01-27 DIAGNOSIS — D638 Anemia in other chronic diseases classified elsewhere: Secondary | ICD-10-CM | POA: Diagnosis present

## 2024-01-27 DIAGNOSIS — K7460 Unspecified cirrhosis of liver: Secondary | ICD-10-CM | POA: Diagnosis present

## 2024-01-27 DIAGNOSIS — I4891 Unspecified atrial fibrillation: Secondary | ICD-10-CM | POA: Diagnosis not present

## 2024-01-27 DIAGNOSIS — G4733 Obstructive sleep apnea (adult) (pediatric): Secondary | ICD-10-CM | POA: Diagnosis present

## 2024-01-27 DIAGNOSIS — I5042 Chronic combined systolic (congestive) and diastolic (congestive) heart failure: Secondary | ICD-10-CM | POA: Diagnosis present

## 2024-01-27 DIAGNOSIS — I2510 Atherosclerotic heart disease of native coronary artery without angina pectoris: Secondary | ICD-10-CM | POA: Diagnosis present

## 2024-01-27 DIAGNOSIS — I493 Ventricular premature depolarization: Secondary | ICD-10-CM | POA: Diagnosis present

## 2024-01-27 DIAGNOSIS — Z823 Family history of stroke: Secondary | ICD-10-CM

## 2024-01-27 DIAGNOSIS — I10 Essential (primary) hypertension: Secondary | ICD-10-CM | POA: Diagnosis present

## 2024-01-27 DIAGNOSIS — Z5982 Transportation insecurity: Secondary | ICD-10-CM

## 2024-01-27 DIAGNOSIS — K5900 Constipation, unspecified: Secondary | ICD-10-CM | POA: Diagnosis present

## 2024-01-27 DIAGNOSIS — K760 Fatty (change of) liver, not elsewhere classified: Secondary | ICD-10-CM | POA: Diagnosis present

## 2024-01-27 DIAGNOSIS — L89899 Pressure ulcer of other site, unspecified stage: Secondary | ICD-10-CM | POA: Diagnosis present

## 2024-01-27 HISTORY — PX: INCISION AND DRAINAGE OF WOUND: SHX1803

## 2024-01-27 HISTORY — PX: APPLICATION OF WOUND VAC: SHX5189

## 2024-01-27 LAB — COMPREHENSIVE METABOLIC PANEL WITH GFR
ALT: 12 U/L (ref 0–44)
AST: 18 U/L (ref 15–41)
Albumin: 2.9 g/dL — ABNORMAL LOW (ref 3.5–5.0)
Alkaline Phosphatase: 96 U/L (ref 38–126)
Anion gap: 13 (ref 5–15)
BUN: 14 mg/dL (ref 8–23)
CO2: 24 mmol/L (ref 22–32)
Calcium: 8.7 mg/dL — ABNORMAL LOW (ref 8.9–10.3)
Chloride: 103 mmol/L (ref 98–111)
Creatinine, Ser: 0.82 mg/dL (ref 0.61–1.24)
GFR, Estimated: >60 mL/min (ref >60)
Glucose, Bld: 106 mg/dL — ABNORMAL HIGH (ref 70–99)
Potassium: 3.4 mmol/L — ABNORMAL LOW (ref 3.5–5.1)
Sodium: 140 mmol/L (ref 135–145)
Total Bilirubin: 0.8 mg/dL (ref 0.0–1.2)
Total Protein: 6.2 g/dL — ABNORMAL LOW (ref 6.5–8.1)

## 2024-01-27 LAB — CBC WITH DIFFERENTIAL/PLATELET
Abs Immature Granulocytes: 0.03 K/uL (ref 0.00–0.07)
Basophils Absolute: 0 K/uL (ref 0.0–0.1)
Basophils Relative: 0 %
Eosinophils Absolute: 0.2 K/uL (ref 0.0–0.5)
Eosinophils Relative: 4 %
HCT: 30.9 % — ABNORMAL LOW (ref 39.0–52.0)
Hemoglobin: 9.3 g/dL — ABNORMAL LOW (ref 13.0–17.0)
Immature Granulocytes: 1 %
Lymphocytes Relative: 11 %
Lymphs Abs: 0.6 K/uL — ABNORMAL LOW (ref 0.7–4.0)
MCH: 25.4 pg — ABNORMAL LOW (ref 26.0–34.0)
MCHC: 30.1 g/dL (ref 30.0–36.0)
MCV: 84.4 fL (ref 80.0–100.0)
Monocytes Absolute: 0.3 K/uL (ref 0.1–1.0)
Monocytes Relative: 5 %
Neutro Abs: 4.4 K/uL (ref 1.7–7.7)
Neutrophils Relative %: 79 %
Platelets: 118 K/uL — ABNORMAL LOW (ref 150–400)
RBC: 3.66 MIL/uL — ABNORMAL LOW (ref 4.22–5.81)
RDW: 16.7 % — ABNORMAL HIGH (ref 11.5–15.5)
WBC: 5.6 K/uL (ref 4.0–10.5)
nRBC: 0 % (ref 0.0–0.2)

## 2024-01-27 LAB — PROTIME-INR
INR: 1 (ref 0.8–1.2)
Prothrombin Time: 13.9 s (ref 11.4–15.2)

## 2024-01-27 LAB — SURGICAL PCR SCREEN
MRSA, PCR: NEGATIVE
Staphylococcus aureus: NEGATIVE

## 2024-01-27 SURGERY — IRRIGATION AND DEBRIDEMENT WOUND
Anesthesia: General | Site: Leg Lower | Laterality: Left

## 2024-01-27 MED ORDER — ORAL CARE MOUTH RINSE: 15.0000 mL | Freq: Once | OROMUCOSAL | Status: AC

## 2024-01-27 MED ORDER — FENTANYL CITRATE (PF) 250 MCG/5ML IJ SOLN
INTRAMUSCULAR | Status: DC | PRN
  Administered 2024-01-27: 100 ug via INTRAVENOUS
  Administered 2024-01-27 (×2): 50 ug via INTRAVENOUS

## 2024-01-27 MED ORDER — MEPERIDINE HCL 25 MG/ML IJ SOLN: 6.2500 mg | INTRAMUSCULAR | Status: DC | PRN

## 2024-01-27 MED ORDER — PHENYLEPHRINE HCL-NACL 20-0.9 MG/250ML-% IV SOLN
INTRAVENOUS | Status: DC | PRN
Start: 2024-01-27 — End: 2024-01-27
  Administered 2024-01-27: 55 ug/min via INTRAVENOUS

## 2024-01-27 MED ORDER — CHLORHEXIDINE GLUCONATE 0.12 % MT SOLN
OROMUCOSAL | Status: AC
  Administered 2024-01-27: 15 mL via OROMUCOSAL
  Filled 2024-01-27: qty 15

## 2024-01-27 MED ORDER — SENNA 8.6 MG PO TABS
1.0000 | ORAL_TABLET | Freq: Two times a day (BID) | ORAL | Status: DC
  Administered 2024-01-27 – 2024-02-04 (×13): 8.6 mg via ORAL
  Filled 2024-01-27 (×16): qty 1

## 2024-01-27 MED ORDER — OXYCODONE HCL 5 MG PO TABS: 5.0000 mg | ORAL_TABLET | Freq: Once | ORAL | Status: DC | PRN

## 2024-01-27 MED ORDER — POLYETHYLENE GLYCOL 3350 17 G PO PACK
17.0000 g | PACK | Freq: Every day | ORAL | Status: DC | PRN
  Administered 2024-01-29: 17 g via ORAL
  Filled 2024-01-27: qty 1

## 2024-01-27 MED ORDER — PROPOFOL 10 MG/ML IV BOLUS
INTRAVENOUS | Status: AC
  Filled 2024-01-27: qty 20

## 2024-01-27 MED ORDER — ALBUTEROL SULFATE (2.5 MG/3ML) 0.083% IN NEBU: 2.5000 mg | INHALATION_SOLUTION | RESPIRATORY_TRACT | Status: DC | PRN

## 2024-01-27 MED ORDER — 0.9 % SODIUM CHLORIDE (POUR BTL) OPTIME
TOPICAL | Status: DC | PRN
  Administered 2024-01-27: 1000 mL

## 2024-01-27 MED ORDER — ACETAMINOPHEN 650 MG RE SUPP: 650.0000 mg | Freq: Four times a day (QID) | RECTAL | Status: DC | PRN

## 2024-01-27 MED ORDER — HYDROMORPHONE HCL 1 MG/ML IJ SOLN
INTRAMUSCULAR | Status: AC
  Filled 2024-01-27: qty 1

## 2024-01-27 MED ORDER — LIDOCAINE 2% (20 MG/ML) 5 ML SYRINGE
INTRAMUSCULAR | Status: DC | PRN
  Administered 2024-01-27: 60 mg via INTRAVENOUS

## 2024-01-27 MED ORDER — CHLORHEXIDINE GLUCONATE 0.12 % MT SOLN: 15.0000 mL | Freq: Once | OROMUCOSAL | Status: AC

## 2024-01-27 MED ORDER — OXYCODONE HCL 5 MG PO TABS
5.0000 mg | ORAL_TABLET | Freq: Four times a day (QID) | ORAL | Status: DC | PRN
  Administered 2024-01-27 – 2024-01-28 (×2): 5 mg via ORAL
  Filled 2024-01-27 (×2): qty 1

## 2024-01-27 MED ORDER — MIDAZOLAM HCL 2 MG/2ML IJ SOLN
INTRAMUSCULAR | Status: AC
  Filled 2024-01-27: qty 2

## 2024-01-27 MED ORDER — SUCCINYLCHOLINE CHLORIDE 200 MG/10ML IV SOSY
PREFILLED_SYRINGE | INTRAVENOUS | Status: DC | PRN
  Administered 2024-01-27: 120 mg via INTRAVENOUS

## 2024-01-27 MED ORDER — HYDROMORPHONE HCL 1 MG/ML IJ SOLN
INTRAMUSCULAR | Status: DC | PRN
  Administered 2024-01-27: .5 mg via INTRAVENOUS

## 2024-01-27 MED ORDER — LACTATED RINGERS IV SOLN: INTRAVENOUS | Status: DC

## 2024-01-27 MED ORDER — HYDROMORPHONE HCL 1 MG/ML IJ SOLN
0.2500 mg | INTRAMUSCULAR | Status: DC | PRN
  Administered 2024-01-27 (×4): 0.5 mg via INTRAVENOUS

## 2024-01-27 MED ORDER — DEXAMETHASONE SOD PHOSPHATE PF 10 MG/ML IJ SOLN
INTRAMUSCULAR | Status: DC | PRN
  Administered 2024-01-27: 4 mg via INTRAVENOUS

## 2024-01-27 MED ORDER — OXYCODONE HCL 5 MG/5ML PO SOLN: 5.0000 mg | Freq: Once | ORAL | Status: DC | PRN

## 2024-01-27 MED ORDER — FENTANYL CITRATE (PF) 250 MCG/5ML IJ SOLN
INTRAMUSCULAR | Status: AC
  Filled 2024-01-27: qty 5

## 2024-01-27 MED ORDER — POVIDONE-IODINE 10 % EX SWAB
2.0000 | Freq: Once | CUTANEOUS | Status: AC
  Administered 2024-01-27: 2 via TOPICAL

## 2024-01-27 MED ORDER — AMISULPRIDE (ANTIEMETIC) 5 MG/2ML IV SOLN
INTRAVENOUS | Status: AC
  Filled 2024-01-27: qty 4

## 2024-01-27 MED ORDER — CEFAZOLIN SODIUM-DEXTROSE 3-4 GM/150ML-% IV SOLN
3.0000 g | INTRAVENOUS | Status: AC
  Administered 2024-01-27: 3 g via INTRAVENOUS
  Filled 2024-01-27: qty 150

## 2024-01-27 MED ORDER — PROPOFOL 10 MG/ML IV BOLUS
INTRAVENOUS | Status: DC | PRN
  Administered 2024-01-27: 150 mg via INTRAVENOUS

## 2024-01-27 MED ORDER — CHLORHEXIDINE GLUCONATE 4 % EX SOLN
60.0000 mL | Freq: Once | CUTANEOUS | Status: AC
  Administered 2024-01-27: 4 via TOPICAL
  Filled 2024-01-27: qty 60

## 2024-01-27 MED ORDER — ONDANSETRON HCL 4 MG/2ML IJ SOLN
INTRAMUSCULAR | Status: DC | PRN
  Administered 2024-01-27: 4 mg via INTRAVENOUS

## 2024-01-27 MED ORDER — FENTANYL CITRATE (PF) 100 MCG/2ML IJ SOLN
INTRAMUSCULAR | Status: AC
  Filled 2024-01-27: qty 2

## 2024-01-27 MED ORDER — AMISULPRIDE (ANTIEMETIC) 5 MG/2ML IV SOLN
10.0000 mg | Freq: Once | INTRAVENOUS | Status: AC
  Administered 2024-01-27: 10 mg via INTRAVENOUS

## 2024-01-27 MED ORDER — SODIUM CHLORIDE 0.9 % IR SOLN
Status: DC | PRN
  Administered 2024-01-27: 3000 mL

## 2024-01-27 MED ORDER — HYDROMORPHONE HCL 1 MG/ML IJ SOLN
0.5000 mg | INTRAMUSCULAR | Status: DC | PRN
  Administered 2024-01-27: 0.5 mg via INTRAVENOUS
  Filled 2024-01-27: qty 0.5

## 2024-01-27 MED ORDER — POTASSIUM CHLORIDE CRYS ER 20 MEQ PO TBCR
40.0000 meq | EXTENDED_RELEASE_TABLET | Freq: Once | ORAL | Status: AC
  Administered 2024-01-27: 40 meq via ORAL
  Filled 2024-01-27: qty 2

## 2024-01-27 MED ORDER — ACETAMINOPHEN 325 MG PO TABS: 650.0000 mg | ORAL_TABLET | Freq: Four times a day (QID) | ORAL | Status: DC | PRN

## 2024-01-27 MED ORDER — HYDROMORPHONE HCL 1 MG/ML IJ SOLN
INTRAMUSCULAR | Status: AC
  Filled 2024-01-27: qty 0.5

## 2024-01-27 MED ORDER — OXYCODONE HCL 5 MG PO TABS
15.0000 mg | ORAL_TABLET | Freq: Once | ORAL | Status: AC
  Administered 2024-01-27: 15 mg via ORAL
  Filled 2024-01-27: qty 3

## 2024-01-27 MED ORDER — SODIUM CHLORIDE 0.9% FLUSH
3.0000 mL | Freq: Two times a day (BID) | INTRAVENOUS | Status: DC
  Administered 2024-01-27 – 2024-02-04 (×16): 3 mL via INTRAVENOUS

## 2024-01-27 MED ORDER — MIDAZOLAM HCL (PF) 2 MG/2ML IJ SOLN: 0.5000 mg | Freq: Once | INTRAMUSCULAR | Status: DC | PRN

## 2024-01-27 SURGICAL SUPPLY — 39 items
BAG COUNTER SPONGE SURGICOUNT (BAG) ×1 IMPLANT
BANDAGE ESMARK 6X9 LF (GAUZE/BANDAGES/DRESSINGS) IMPLANT
BNDG COHESIVE 4X5 TAN STRL LF (GAUZE/BANDAGES/DRESSINGS) ×1 IMPLANT
BNDG COHESIVE 6X5 TAN NS LF (GAUZE/BANDAGES/DRESSINGS) IMPLANT
BNDG COHESIVE 6X5 TAN ST LF (GAUZE/BANDAGES/DRESSINGS) ×1 IMPLANT
BNDG GAUZE DERMACEA FLUFF 4 (GAUZE/BANDAGES/DRESSINGS) ×1 IMPLANT
CANISTER WOUNDNEG PRESSURE 500 (CANNISTER) IMPLANT
COVER SURGICAL LIGHT HANDLE (MISCELLANEOUS) ×1 IMPLANT
DRAPE C-ARM 42X120 X-RAY (DRAPES) ×1 IMPLANT
DRAPE DERMATAC (DRAPES) IMPLANT
DRAPE OEC MINIVIEW 54X84 (DRAPES) IMPLANT
DRAPE U-SHAPE 47X51 STRL (DRAPES) ×1 IMPLANT
DRESSING VERAFLO CLEANS CC MED (GAUZE/BANDAGES/DRESSINGS) IMPLANT
DRSG ADAPTIC 3X8 NADH LF (GAUZE/BANDAGES/DRESSINGS) ×1 IMPLANT
DURAPREP 26ML APPLICATOR (WOUND CARE) ×1 IMPLANT
ELECTRODE REM PT RTRN 9FT ADLT (ELECTROSURGICAL) ×1 IMPLANT
GAUZE PAD ABD 8X10 STRL (GAUZE/BANDAGES/DRESSINGS) ×1 IMPLANT
GAUZE SPONGE 4X4 12PLY STRL (GAUZE/BANDAGES/DRESSINGS) ×1 IMPLANT
GLOVE BIO SURGEON STRL SZ7.5 (GLOVE) ×1 IMPLANT
GLOVE BIOGEL PI IND STRL 6.5 (GLOVE) ×1 IMPLANT
GLOVE ECLIPSE 6.0 STRL STRAW (GLOVE) ×1 IMPLANT
GLOVE INDICATOR 8.0 STRL GRN (GLOVE) ×1 IMPLANT
GOWN STRL REUS W/ TWL LRG LVL3 (GOWN DISPOSABLE) ×1 IMPLANT
GOWN STRL REUS W/ TWL XL LVL3 (GOWN DISPOSABLE) ×3 IMPLANT
GRAFT SKIN WND SURGICLOSE M95 (Tissue) IMPLANT
KIT BASIN OR (CUSTOM PROCEDURE TRAY) ×1 IMPLANT
KIT TURNOVER KIT B (KITS) ×1 IMPLANT
MANIFOLD NEPTUNE II (INSTRUMENTS) ×1 IMPLANT
PACK ORTHO EXTREMITY (CUSTOM PROCEDURE TRAY) ×1 IMPLANT
PAD ARMBOARD POSITIONER FOAM (MISCELLANEOUS) ×2 IMPLANT
SET HNDPC FAN SPRY TIP SCT (DISPOSABLE) IMPLANT
SOLN 0.9% NACL 1000 ML (IV SOLUTION) ×1 IMPLANT
SOLN 0.9% NACL 3000 ML (IV SOLUTION) IMPLANT
SOLN 0.9% NACL POUR BTL 1000ML (IV SOLUTION) ×1 IMPLANT
SUCTION TUBE FRAZIER 10FR DISP (SUCTIONS) ×1 IMPLANT
SUT VIC AB 2-0 CT1 TAPERPNT 27 (SUTURE) ×1 IMPLANT
TOWEL GREEN STERILE (TOWEL DISPOSABLE) ×1 IMPLANT
TOWEL GREEN STERILE FF (TOWEL DISPOSABLE) ×1 IMPLANT
TUBE CONNECTING 12X1/4 (SUCTIONS) ×1 IMPLANT

## 2024-01-27 NOTE — ED Provider Notes (Signed)
 [place] EMERGENCY DEPARTMENT AT [facility] Provider Note   CSN: [id number] Arrival date & time: 01/27/24  1233     Patient presents with: Leg Injury and Leg Swelling   Charles Gray is a 68 y.o. male who presents to the emergency department with a chief complaint of swollen and painful leg that is actively bleeding.  Patient is currently at [facility] health and rehab due to a hemorrhage of his left lower leg. Patient was at physical therapy today and his leg started to bleed.  Patient states that it just ripped open.  Patient's leg was wrapped and pressure was applied and bleeding was controlled.  Patient has a history of internal hemorrhage in his leg back on 10/7.  Patient was previously on Eliquis  however was taken off of this due to the internal hemorrhage of his calf.  Past medical history significant for history of DVT, chronic anticoagulation on Eliquis , hyperlipidemia, anemia of chronic disease, cirrhosis of liver, heart failure, PE, paroxysmal atrial fibrillation, hypertension, hyperlipidemia, COPD, etc.  Per chart review on 01/18/2024 patient was admitted at [facility] due to leg swelling, diagnosis was hematoma and left calf hematoma with extravasation.  Interventional radiology was consulted coagulation was reversed.  IR recommended no procedure at this time and to monitor hematoma size and hemoglobin.  Patient ultimately discharged to rehab facility.   HPI     Prior to Admission medications   Medication Sig Start Date End Date Taking? Authorizing Provider  apixaban  (ELIQUIS ) 5 MG TABS tablet Take 1 tablet (5 mg total) by mouth 2 (two) times daily. 11/19/21   Bensimhon, Toribio SAUNDERS, MD  bacitracin  ointment Apply topically daily. 11/05/23   Tobie Yetta HERO, MD  eplerenone  (INSPRA ) 50 MG tablet TAKE TWO TABLETS BY MOUTH DAILY 10/13/23   Glena Harlene HERO, FNP  ferrous sulfate  325 (65 FE) MG tablet Take 1 tablet (325 mg total) by mouth daily with  breakfast. 11/05/23   Tobie Yetta HERO, MD  gabapentin  (NEURONTIN ) 800 MG tablet Take 1 tablet (800 mg total) by mouth 3 (three) times daily. 11/04/23   Patel, Pranav M, MD  hydrocerin (EUCERIN) CREA Apply 1 Application topically 2 (two) times daily. 11/04/23   Tobie Yetta HERO, MD  lactulose , encephalopathy, (CHRONULAC ) 10 GM/15ML SOLN Take 30 mLs (20 g total) by mouth 2 (two) times daily. Patient taking differently: Take 20 g by mouth 2 (two) times daily as needed. 03/11/22   Fairy Frames, MD  methocarbamol  (ROBAXIN ) 500 MG tablet Take 1 tablet (500 mg total) by mouth 3 (three) times daily. 11/04/23   Tobie Yetta HERO, MD  metolazone  (ZAROXOLYN ) 2.5 MG tablet Take 1 tablet (2.5 mg total) by mouth 3 (three) times a week. on Mondays Wednesdays and Fridays 05/28/23   Rolan Ezra RAMAN, MD  metoprolol  succinate (TOPROL -XL) 25 MG 24 hr tablet TAKE ONE TABLET BY MOUTH AT BEDTIME 03/23/23   McLean, Dalton S, MD  ondansetron  (ZOFRAN -ODT) 4 MG disintegrating tablet Take 4 mg by mouth every 8 (eight) hours as needed for nausea or vomiting (dissolve orally).    [provider]  oxyCODONE  (ROXICODONE ) 15 MG immediate release tablet Take 1 tablet (15 mg total) by mouth every 4 (four) hours as needed for severe pain (pain score 7-10) or moderate pain (pain score 4-6). 11/04/23   Tobie Yetta HERO, MD  pantoprazole  (PROTONIX ) 40 MG tablet Take 40 mg by mouth 2 (two) times daily before a meal.    [provider]  potassium chloride  (KLOR-CON ) 20 MEQ packet Take 100 mEq by mouth 3 (three) times daily. Take an extra 40 mEq on days when you take metolazone  05/11/22   Rolan Ezra RAMAN, MD  pravastatin  (PRAVACHOL ) 20 MG tablet Take 1 tablet by mouth daily. Patient taking differently: Take 20 mg by mouth at bedtime. 09/16/23   Rolan Ezra RAMAN, MD  sertraline  (ZOLOFT ) 100 MG tablet Take 100 mg by mouth at bedtime. At night    [provider]  tamsulosin  (FLOMAX ) 0.4 MG CAPS capsule Take 1 capsule (0.4 mg  total) by mouth daily. Additional refills will need to come from PCP 04/01/22   Rolan Ezra RAMAN, MD  torsemide  (DEMADEX ) 100 MG tablet TAKE ONE TABLET BY MOUTH DAILY IN THE MORNING AND EVENING 09/16/23   Rolan Ezra RAMAN, MD  triamcinolone  (KENALOG ) 0.025 % cream Apply 1 Application topically 3 (three) times daily as needed (skin irritation). 08/25/23   [provider]  VENTOLIN  HFA 108 (90 Base) MCG/ACT inhaler Inhale 1 puff into the lungs every 4 (four) hours as needed for shortness of breath. Patient taking differently: Inhale 2 puffs into the lungs every 4 (four) hours as needed for shortness of breath. 03/23/19   Angiulli, Toribio PARAS, PA-C  zolpidem  (AMBIEN ) 10 MG tablet Take 1 tablet (10 mg total) by mouth at bedtime as needed for sleep. 11/04/23   Tobie Yetta HERO, MD  bumetanide  (BUMEX ) 1 MG tablet Take 4 tablets (4 mg total) by mouth daily with breakfast AND 3 tablets (3 mg total) every evening. 05/10/20 06/13/20  Rolan Ezra RAMAN, MD  metoprolol  tartrate (LOPRESSOR ) 50 MG tablet Take 1 tablet (50 mg total) by mouth 2 (two) times daily. 02/09/20 04/11/20  Gonfa, Taye T, MD    Allergies: Penicillins    Review of Systems  Skin:  Positive for wound (left leg open wound).    Updated Vital Signs BP (!) 109/57   Pulse 63   Temp 97.7 F (36.5 C) (Oral)   Resp 14   Ht 6' 10 (2.083 m)   Wt (!) 140.6 kg   SpO2 100%   BMI 32.41 kg/m   Physical Exam Vitals and nursing note reviewed.  Constitutional:      General: He is awake. He is not in acute distress.    Appearance: Normal appearance. He is not ill-appearing, toxic-appearing or diaphoretic.  HENT:     Head: Normocephalic and atraumatic.  Eyes:     General: No scleral icterus. Pulmonary:     Effort: Pulmonary effort is normal. No respiratory distress.  Musculoskeletal:     Comments: Tenderness to palpation surrounding hematoma area of L shin/calf  Normal ROM of LLE with good capillary refill including plantar flexion and  dorsiflexion of LLE  Skin:    General: Skin is warm.     Capillary Refill: Capillary refill takes less than 2 seconds.     Comments: Open wound present to medial LLE over calf area, blood clots present that appear to be old blood, foul-smelling, extensive erythema and ecchymosis surrounding left shin and calf area  Neurological:     General: No focal deficit present.     Mental Status: He is alert and oriented to person, place, and time.  Psychiatric:        Mood and Affect: Mood normal.        Behavior: Behavior normal. Behavior is cooperative.          (all labs ordered are listed, but only abnormal results are  displayed) Labs Reviewed  CBC WITH DIFFERENTIAL/PLATELET - Abnormal; Notable for the following components:      Result Value   RBC 3.66 (*)    Hemoglobin 9.3 (*)    HCT 30.9 (*)    MCH 25.4 (*)    RDW 16.7 (*)    Platelets 118 (*)    Lymphs Abs 0.6 (*)    All other components within normal limits  COMPREHENSIVE METABOLIC PANEL WITH GFR - Abnormal; Notable for the following components:   Potassium 3.4 (*)    Glucose, Bld 106 (*)    Calcium 8.7 (*)    Total Protein 6.2 (*)    Albumin  2.9 (*)    All other components within normal limits  PROTIME-INR    EKG: None  Radiology: No results found.   Procedures   Medications Ordered in the ED  oxyCODONE  (Oxy IR/ROXICODONE ) immediate release tablet 15 mg (15 mg Oral Given 01/27/24 1443)    Clinical Course as of 01/27/24 1619  Thu Jan 27, 2024  1357 Spoke with Marjorie with general surgery, waiting for go ahead with general surgery or may need to consult ortho [CH]    Clinical Course User Index [CH] Aviyana Sonntag, Terrall FALCON, PA-C                                 Medical Decision Making Amount and/or Complexity of Data Reviewed Labs: ordered.  Risk Prescription drug management. Decision regarding hospitalization.   Patient presents to the ED for concern of left leg pain and active bleeding, this involves an  extensive number of treatment options, and is a complaint that carries with it a high risk of complications and morbidity.  The differential diagnosis includes trauma/injury, complication from previous admission for left lower extremity hematoma, fracture, etc.   Co morbidities that complicate the patient evaluation   history of DVT, chronic anticoagulation on Eliquis , hyperlipidemia, anemia of chronic disease, cirrhosis of liver, heart failure, PE, paroxysmal atrial fibrillation, hypertension, hyperlipidemia, COPD   Additional history obtained:  Additional history obtained from 10/7 admission to [facility] for left leg pain resulting in left leg hematoma   Lab Tests:  I Ordered, and personally interpreted labs.  The pertinent results include: Workup in the emergency department significant for no elevated white blood cell count, stable hemoglobin at 9.3 which appears improved with previous, CMP shows slightly decreased potassium, calcium, total protein, and albumin , PT/INR unremarkable   Medicines ordered and prescription drug management:  I ordered medication including oxycodone  for pain Reevaluation of the patient after these medicines showed that the patient stayed the same I have reviewed the patients home medicines and have made adjustments as needed   Test Considered:  CT angiography of left lower extremity: Declined at this time as bleeding is controlled with no pressure needed at this time, active wound area is not leaking bright red blood, but is instead dark-colored and foul-smelling, suspicious that this is old clotted blood, no signs of an active bleed, patient is not hypotensive and not in acute distress, low clinical suspicion for acute arterial bleed   Critical Interventions:  None   Consultations Obtained:  I requested consultation with the orthopedic team,  and discussed lab and imaging findings as well as pertinent plan - they recommend:  Admission to medicine with surgical intervention later today, also spoke with hospitalist team who agrees with admission and treatment   Problem List /  ED Course:  68 year old male, recent history of left lower extremity hematoma with extravasation, admitted at [facility], placed in rehab, during PT today hematoma area left lower extremity busted open with active bleeding, bleeding controlled, patient no longer on blood thinning medication Physical exam bandage removed, no active bleeding, foul-smelling, dark-colored blood clots present and able to be removed out of the wound, patient has normal range of motion of left ankle, with intact sensation of plantar surface of left foot, vital signs stable Spoke with the orthopedic team, Ozell Kenner, PA-C saw patient and recommends surgery, possibly today. Recommends medical admit and clearance.  Consult to hospitalist made, spoke with Dr. Judeth team who agrees with admission for ongoing diagnosis and treatment Lab work appears to be at baseline for patient, slightly improved hemoglobin, normal PT/INR, patient states that he has stopped anticoagulation Patient admitted to the hospital with possible surgical intervention later today Most likely diagnosis at this time is complication of previous left lower extremity hematoma with no sign of acute arterial bleed, patient vital signs stayed stable throughout course in the emergency department patient was signed in acute distress.  Left lower extremity neurovascularly intact.   Reevaluation:  After the interventions noted above, I reevaluated the patient and found that they have :stayed the same   Social Determinants of Health:  none   Dispostion:  After consideration of the diagnostic results and the patients response to treatment, I feel that the patent would benefit from admission to the hospital for ongoing diagnosis and treatment.     Final diagnoses:  Hematoma  Pain of left  calf    ED Discharge Orders     None          Nedim Oki F, PA-C 01/27/24 1810    Patsey Lot, MD 01/29/24 [phone]

## 2024-01-27 NOTE — ED Notes (Signed)
 Patient transported to PACU.

## 2024-01-27 NOTE — ED Notes (Signed)
 Iv attempt x2, no blood return

## 2024-01-27 NOTE — ED Notes (Signed)
 One extra blue blood culture drawn

## 2024-01-27 NOTE — Anesthesia Preprocedure Evaluation (Addendum)
 Anesthesia Evaluation  Patient identified by MRN, date of birth, ID band Patient awake    Reviewed: Allergy & Precautions, NPO status , Patient's Chart, lab work & pertinent test results  Airway Mallampati: III  TM Distance: >3 FB Neck ROM: Full    Dental   Pulmonary sleep apnea , COPD   breath sounds clear to auscultation       Cardiovascular hypertension, Pt. on medications and Pt. on home beta blockers +CHF and + DVT (Hx PE on chronic anticoagulation - Eliquis - discontinued 2/2 spontaneous left leg wound)  + dysrhythmias Atrial Fibrillation  Rhythm:Irregular Rate:Normal  02/2022 Echo: 1. Left ventricular ejection fraction, by estimation, is 55 to 60%. The  left ventricle has normal function. The left ventricle has no regional  wall motion abnormalities. The left ventricular internal cavity size was  not well visualized. Left ventricular   diastolic parameters are indeterminate.   2. Right ventricular systolic function was not well visualized. The right  ventricular size is not well visualized. There is normal pulmonary artery  systolic pressure.   3. The mitral valve was not well visualized. No evidence of mitral valve  regurgitation.   4. The aortic valve was not well visualized. Aortic valve regurgitation  is not visualized. No aortic stenosis is present.   5. Aortic dilatation noted. There is mild dilatation of the aortic root,  measuring 40 mm.   6. Technically difficult study.      Neuro/Psych negative neurological ROS     GI/Hepatic ,GERD  ,,(+) Cirrhosis         Endo/Other    Renal/GU Renal disease     Musculoskeletal   Abdominal   Peds  Hematology  (+) Blood dyscrasia, anemia   Anesthesia Other Findings   Reproductive/Obstetrics                              Anesthesia Physical Anesthesia Plan  ASA: 3  Anesthesia Plan: General   Post-op Pain Management:     Induction: Intravenous  PONV Risk Score and Plan: 2 and Dexamethasone and Ondansetron   Airway Management Planned: Oral ETT  Additional Equipment:   Intra-op Plan:   Post-operative Plan: Extubation in OR  Informed Consent: I have reviewed the patients History and Physical, chart, labs and discussed the procedure including the risks, benefits and alternatives for the proposed anesthesia with the patient or authorized representative who has indicated his/her understanding and acceptance.     Dental advisory given  Plan Discussed with: CRNA  Anesthesia Plan Comments:          Anesthesia Quick Evaluation

## 2024-01-27 NOTE — ED Triage Notes (Signed)
 Pt from [facility], hemorrhage of left lower leg. Pt was at PT and leg started bleeding, pt says it just ripped open but wrapped and bleeding controlled at this time. Pt has hx of internal hemorrhage in left leg on 10/7, CHF. On Elliquis. 1005 15mg  Oxycodone  with EMS. Penicillin allergy

## 2024-01-27 NOTE — Transfer of Care (Signed)
 Immediate Anesthesia Transfer of Care Note  Patient: Charles Gray  Procedure(s) Performed: IRRIGATION AND DEBRIDEMENT WOUND (Left: Leg Lower) APPLICATION, WOUND VAC (Left: Leg Lower)  Patient Location: PACU  Anesthesia Type:General  Level of Consciousness: awake, alert , and oriented  Airway & Oxygen Therapy: Patient Spontanous Breathing and Patient connected to face mask oxygen  Post-op Assessment: Report given to RN and Post -op Vital signs reviewed and stable  Post vital signs: Reviewed and stable  Last Vitals:  Vitals Value Taken Time  BP 114/76 01/27/24 19:00  Temp 37 C 01/27/24 18:56  Pulse 104 01/27/24 19:09  Resp 14 01/27/24 19:09  SpO2 96 % 01/27/24 19:09  Vitals shown include unfiled device data.  Last Pain:  Vitals:   01/27/24 1856  TempSrc:   PainSc: 10-Worst pain ever         Complications: No notable events documented.

## 2024-01-27 NOTE — Anesthesia Procedure Notes (Signed)
 Procedure Name: Intubation Date/Time: 01/27/2024 6:03 PM  Performed by: Mannie Krystal LABOR, CRNAPre-anesthesia Checklist: Patient identified, Emergency Drugs available, Suction available and Patient being monitored Patient Re-evaluated:Patient Re-evaluated prior to induction Oxygen Delivery Method: Circle system utilized Preoxygenation: Pre-oxygenation with 100% oxygen Induction Type: IV induction and Rapid sequence Laryngoscope Size: Mac and 4 Grade View: Grade I Tube type: Oral Tube size: 8.0 mm Number of attempts: 1 Airway Equipment and Method: Stylet Placement Confirmation: ETT inserted through vocal cords under direct vision, positive ETCO2 and breath sounds checked- equal and bilateral Secured at: 23 cm Tube secured with: Tape Dental Injury: Teeth and Oropharynx as per pre-operative assessment

## 2024-01-27 NOTE — Op Note (Signed)
   Date of Surgery: 01/27/2024  INDICATIONS: Mr. Charles Gray is a 68 y.o.-year-old male with left leg necrotic skin wound.  The risk and benefits of the procedure were discussed in detail and documented in the pre-operative evaluation.   PREOPERATIVE DIAGNOSIS: 1.  Left leg pressure ulcer from hematoma, wound measuring 50 x 10 cm including subcutaneous tissue down to fascia  POSTOPERATIVE DIAGNOSIS: Same.  PROCEDURE: 1.  Irrigation and debridement subcutaneous tissue including fascia wound measuring 50 x 10 cm  SURGEON: Elspeth LITTIE Parker MD  ASSISTANT: Conley Dawson, ATC  ANESTHESIA:  general  IV FLUIDS AND URINE: See anesthesia record.  ANTIBIOTICS: Ancef  ESTIMATED BLOOD LOSS: 10 mL.  IMPLANTS:  Implant Name Type Inv. Item Serial No. Manufacturer Lot No. LRB No. Used Action  GRAFT SKIN WND SURGICLOSE M95 - [id number] Tissue GRAFT SKIN WND SURGICLOSE M95  [employer] [id number] Left 1 Implanted    DRAINS: None  CULTURES: None  COMPLICATIONS: none  DESCRIPTION OF PROCEDURE:   The patient was identified in the preoperative holding area.  The correct site was marked according to universal protocol.  He was subsequently taken back to the operating room.  Anesthesia was induced.  He was moved over the operating room table.  Antibiotics were given 1 hour prior to skin incision.  At this time I began by unroofing his necrotic ulceration over his hematoma.  At this time the hematoma was evacuated using curettes as well as forceps.  Nonviable skin edges were also debrided.  The fascia did appear to be well-appearing.  Electrocautery was used to achieve hemostasis.  The fascia was curetted and the muscle was tested with electrocautery which all did appear to be healthy.  At this time the base of the wound and fascia was covered with Kerecis fish powder.  A wound VAC was applied and a pressure Coban dressing was applied over this.  All counts were correct at the end of the case he was taken  the PACU without complication    POSTOPERATIVE PLAN: To be admitted to the floor.  He will likely need serial debridements.  This may occur as early as next week in consultation with Dr. Harden.  May begin physical therapy and be activity as tolerated  Elspeth LITTIE Parker, MD 6:47 PM

## 2024-01-27 NOTE — Interval H&P Note (Signed)
 History and Physical Interval Note:  01/27/2024 5:50 PM  Charles Gray  has presented today for surgery, with the diagnosis of Left Lower Leg Wound.  The various methods of treatment have been discussed with the patient and family. After consideration of risks, benefits and other options for treatment, the patient has consented to  Procedure(s): IRRIGATION AND DEBRIDEMENT WOUND (Left) APPLICATION, WOUND VAC (Left) as a surgical intervention.  The patient's history has been reviewed, patient examined, no change in status, stable for surgery.  I have reviewed the patient's chart and labs.  Questions were answered to the patient's satisfaction.     Sandip Power

## 2024-01-27 NOTE — H&P (Signed)
 ORTHOPAEDIC CONSULTATION  REQUESTING PHYSICIAN: Judeth Trenda BIRCH, MD  Chief Complaint: left leg necrotic wound  HPI: Charles Gray is a 68 y.o. male who presents with left lower leg necrotic ulcer after a hemorrhagic incident 1 week prior.  He was initially treated at [facility] where they were performing local wound care.  He subsequently has developed necrosis of the wound.  Denies any fevers or chills.  Orthopedics was consulted for debridement of the wound  Past Medical History:  Diagnosis Date   Acute on chronic congestive heart failure (HCC)    Acute on chronic diastolic CHF (congestive heart failure) (HCC) 08/23/2019   Acute on chronic right-sided heart failure (HCC) 08/06/2019   Acute respiratory failure with hypoxia (HCC)    Anemia of chronic disease    Atrial fibrillation (HCC) 12/23/2019   Atrial tachycardia 08/12/2019   Benign essential HTN    Chest pain 08/23/2019   Chronic anticoagulation 12/26/2019   Chronic heart failure with preserved EF 05/30/2019   Echo 07/2019: EF 55-60, GR 1 DD   Chronic pain syndrome    Cirrhosis of liver without ascites (HCC)    Class 2 severe obesity due to excess calories with serious comorbidity and body mass index (BMI) of 39.0 to 39.9 in adult 05/30/2019   Debility 03/13/2019   Drug induced constipation    Dyspnea    Fatty liver disease, nonalcoholic    GIB (gastrointestinal bleeding) 03/04/2019   Heart failure, systolic, acute (HCC) 08/24/2019   History of pulmonary embolism 08/12/2019   Hyperlipidemia 01/24/2019   Hypertension 01/24/2019   Hypoalbuminemia due to protein-calorie malnutrition    Hypokalemia    Hyponatremia    Hypotension due to drugs    Insomnia 01/24/2019   Lactic acidosis 08/23/2019   Lumbar disc herniation 01/24/2019   Mixed hyperlipidemia 01/24/2019   Morbid obesity with BMI of 40.0-44.9, adult (HCC) 05/30/2019   Normocytic anemia 06/08/2019   Occasional tremors 08/12/2019   OSA (obstructive sleep  apnea) 08/12/2019   Palpitations 12/26/2019   Peripheral edema    Personal history of DVT (deep vein thrombosis) 12/26/2019   Portal hypertensive gastropathy (HCC) 03/06/2019   Seen on EGD 03/06/2019   Pulmonary emboli (HCC) 06/08/2019   Pulmonary embolism (HCC) 06/08/2019   Redness and swelling of lower leg 08/23/2019   Shock (HCC)    Thrombocytopenia    Past Surgical History:  Procedure Laterality Date   BIOPSY  04/08/2021   Procedure: BIOPSY;  Surgeon: Aneita Gwendlyn DASEN, MD;  Location: [facility] ENDOSCOPY;  Service: Endoscopy;;   ESOPHAGOGASTRODUODENOSCOPY N/A 04/08/2021   Procedure: ESOPHAGOGASTRODUODENOSCOPY (EGD);  Surgeon: Aneita Gwendlyn DASEN, MD;  Location: [facility] ENDOSCOPY;  Service: Endoscopy;  Laterality: N/A;   ESOPHAGOGASTRODUODENOSCOPY (EGD) WITH PROPOFOL  N/A 03/05/2019   Procedure: ESOPHAGOGASTRODUODENOSCOPY (EGD) WITH PROPOFOL ;  Surgeon: Legrand Victory LITTIE DOUGLAS, MD;  Location: [facility] ENDOSCOPY;  Service: Gastroenterology;  Laterality: N/A;  Large Blood Clot Removed   ESOPHAGOGASTRODUODENOSCOPY (EGD) WITH PROPOFOL  N/A 03/06/2019   Procedure: ESOPHAGOGASTRODUODENOSCOPY (EGD) WITH PROPOFOL ;  Surgeon: Eda Iha, MD;  Location: [facility] ENDOSCOPY;  Service: Gastroenterology;  Laterality: N/A;   HEMORROIDECTOMY     PRESSURE SENSOR/CARDIOMEMS N/A 11/14/2020   Procedure: PRESSURE SENSOR/CARDIOMEMS;  Surgeon: Rolan Ezra RAMAN, MD;  Location: [facility] INVASIVE CV LAB;  Service: Cardiovascular;  Laterality: N/A;   RIGHT HEART CATH N/A 08/09/2019   Procedure: RIGHT HEART CATH;  Surgeon: Rolan Ezra RAMAN, MD;  Location: [facility] INVASIVE CV LAB;  Service: Cardiovascular;  Laterality: N/A;   RIGHT HEART CATH N/A  11/01/2020   Procedure: RIGHT HEART CATH;  Surgeon: Rolan Ezra RAMAN, MD;  Location: [facility] INVASIVE CV LAB;  Service: Cardiovascular;  Laterality: N/A;   RIGHT HEART CATH N/A 11/14/2020   Procedure: RIGHT HEART CATH;  Surgeon: Rolan Ezra RAMAN, MD;  Location: [facility] INVASIVE CV LAB;  Service: Cardiovascular;  Laterality: N/A;    RIGHT/LEFT HEART CATH AND CORONARY ANGIOGRAPHY N/A 05/10/2020   Procedure: RIGHT/LEFT HEART CATH AND CORONARY ANGIOGRAPHY;  Surgeon: Rolan Ezra RAMAN, MD;  Location: [facility] INVASIVE CV LAB;  Service: Cardiovascular;  Laterality: N/A;   Social History   Socioeconomic History   Marital status: Single    Spouse name: Not on file   Number of children: Not on file   Years of education: Not on file   Highest education level: Not on file  Occupational History   Not on file  Tobacco Use   Smoking status: Never   Smokeless tobacco: Never  Vaping Use   Vaping status: Never Used  Substance and Sexual Activity   Alcohol use: Not Currently   Drug use: Not Currently    Types: Marijuana    Comment: Smoked for 30 years   Sexual activity: Not on file  Other Topics Concern   Not on file  Social History Narrative   Not on file   Social Drivers of Health   Financial Resource Strain: Medium Risk (11/15/2020)   Overall Financial Resource Strain (CARDIA)    Difficulty of Paying Living Expenses: Somewhat hard  Food Insecurity: Low Risk  (01/19/2024)   Received from [facility]   Hunger Vital Sign    Within the past 12 months, you worried that your food would run out before you got money to buy more: Never true    Within the past 12 months, the food you bought just didn't last and you didn't have money to get more. : Never true  Transportation Needs: No Transportation Needs (01/19/2024)   Received from [facility]    In the past 12 months, has lack of reliable transportation kept you from medical appointments, meetings, work or from getting things needed for daily living? : No  Recent Concern: Transportation Needs - Unmet Transportation Needs (11/02/2023)   PRAPARE - Administrator, Civil Service (Medical): Yes    Lack of Transportation (Non-Medical): Yes  Physical Activity: Not on file  Stress: Not on file  Social Connections: Not on file   Family History  Problem  Relation Age of Onset   Hyperlipidemia Mother    Hypertension Mother    Stroke Mother    CAD Maternal Grandmother    CAD Maternal Grandfather    - negative except otherwise stated in the family history section Allergies  Allergen Reactions   Penicillins Other (See Comments)    Caused convulsions as a child  Did it involve swelling of the face/tongue/throat, SOB, or low BP? N/A Did it involve sudden or severe rash/hives, skin peeling, or any reaction on the inside of your mouth or nose? N/A Did you need to seek medical attention at a hospital or doctor's office?N/A When did it last happen? N/A If all above answers are NO, may proceed with cephalosporin use.   Prior to Admission medications   Medication Sig Start Date End Date Taking? Authorizing Provider  apixaban  (ELIQUIS ) 5 MG TABS tablet Take 1 tablet (5 mg total) by mouth 2 (two) times daily. 11/19/21   Bensimhon, Toribio SAUNDERS, MD  bacitracin  ointment Apply topically daily. 11/05/23  Tobie Yetta HERO, MD  eplerenone  (INSPRA ) 50 MG tablet TAKE TWO TABLETS BY MOUTH DAILY 10/13/23   Glena Harlene HERO, FNP  ferrous sulfate  325 (65 FE) MG tablet Take 1 tablet (325 mg total) by mouth daily with breakfast. 11/05/23   Tobie Yetta HERO, MD  gabapentin  (NEURONTIN ) 800 MG tablet Take 1 tablet (800 mg total) by mouth 3 (three) times daily. 11/04/23   Patel, Pranav M, MD  hydrocerin (EUCERIN) CREA Apply 1 Application topically 2 (two) times daily. 11/04/23   Tobie Yetta HERO, MD  lactulose , encephalopathy, (CHRONULAC ) 10 GM/15ML SOLN Take 30 mLs (20 g total) by mouth 2 (two) times daily. Patient taking differently: Take 20 g by mouth 2 (two) times daily as needed. 03/11/22   Fairy Frames, MD  methocarbamol  (ROBAXIN ) 500 MG tablet Take 1 tablet (500 mg total) by mouth 3 (three) times daily. 11/04/23   Tobie Yetta HERO, MD  metolazone  (ZAROXOLYN ) 2.5 MG tablet Take 1 tablet (2.5 mg total) by mouth 3 (three) times a week. on Mondays Wednesdays and Fridays  05/28/23   Rolan Ezra RAMAN, MD  metoprolol  succinate (TOPROL -XL) 25 MG 24 hr tablet TAKE ONE TABLET BY MOUTH AT BEDTIME 03/23/23   McLean, Dalton S, MD  ondansetron  (ZOFRAN -ODT) 4 MG disintegrating tablet Take 4 mg by mouth every 8 (eight) hours as needed for nausea or vomiting (dissolve orally).    [provider]  oxyCODONE  (ROXICODONE ) 15 MG immediate release tablet Take 1 tablet (15 mg total) by mouth every 4 (four) hours as needed for severe pain (pain score 7-10) or moderate pain (pain score 4-6). 11/04/23   Tobie Yetta HERO, MD  pantoprazole  (PROTONIX ) 40 MG tablet Take 40 mg by mouth 2 (two) times daily before a meal.    [provider]  potassium chloride  (KLOR-CON ) 20 MEQ packet Take 100 mEq by mouth 3 (three) times daily. Take an extra 40 mEq on days when you take metolazone  05/11/22   Rolan Ezra RAMAN, MD  pravastatin  (PRAVACHOL ) 20 MG tablet Take 1 tablet by mouth daily. Patient taking differently: Take 20 mg by mouth at bedtime. 09/16/23   Rolan Ezra RAMAN, MD  sertraline  (ZOLOFT ) 100 MG tablet Take 100 mg by mouth at bedtime. At night    [provider]  tamsulosin  (FLOMAX ) 0.4 MG CAPS capsule Take 1 capsule (0.4 mg total) by mouth daily. Additional refills will need to come from PCP 04/01/22   Rolan Ezra RAMAN, MD  torsemide  (DEMADEX ) 100 MG tablet TAKE ONE TABLET BY MOUTH DAILY IN THE MORNING AND EVENING 09/16/23   Rolan Ezra RAMAN, MD  triamcinolone  (KENALOG ) 0.025 % cream Apply 1 Application topically 3 (three) times daily as needed (skin irritation). 08/25/23   [provider]  VENTOLIN  HFA 108 (90 Base) MCG/ACT inhaler Inhale 1 puff into the lungs every 4 (four) hours as needed for shortness of breath. Patient taking differently: Inhale 2 puffs into the lungs every 4 (four) hours as needed for shortness of breath. 03/23/19   Angiulli, Toribio PARAS, PA-C  zolpidem  (AMBIEN ) 10 MG tablet Take 1 tablet (10 mg total) by mouth at bedtime as needed for sleep.  11/04/23   Tobie Yetta HERO, MD  bumetanide  (BUMEX ) 1 MG tablet Take 4 tablets (4 mg total) by mouth daily with breakfast AND 3 tablets (3 mg total) every evening. 05/10/20 06/13/20  Rolan Ezra RAMAN, MD  metoprolol  tartrate (LOPRESSOR ) 50 MG tablet Take 1 tablet (50 mg total) by mouth 2 (two) times daily. 02/09/20  04/11/20  Gonfa, Taye T, MD   No results found.   Positive ROS: All other systems have been reviewed and were otherwise negative with the exception of those mentioned in the HPI and as above.  Physical Exam: General: No acute distress Cardiovascular: No pedal edema Respiratory: No cyanosis, no use of accessory musculature GI: No organomegaly, abdomen is soft and non-tender Skin: No lesions in the area of chief complaint Neurologic: Sensation intact distally Psychiatric: Patient is at baseline mood and affect Lymphatic: No axillary or cervical lymphadenopathy  MUSCULOSKELETAL:  Left leg with a large black necrotic wound/eschar in the setting of venous appearing changes of the leg.  He is able to wiggle his toes and fire tibialis anterior as well as gastrocsoleus toes are warm well-perfused  Independent Imaging Review: X-ray tibia 2 views Normal  Assessment: 68 year old male with a left tibial necrotic wound secondary to hematoma.  At this time he does not have evidence of compartment syndrome and he has full function of the left foot.  I have discussed his case with Dr. Harden and ultimately we both agree that an initial debridement of the eschar and hematoma evacuation would be ideal order to hopefully eventually get this wound covered.  At this time we will plan for an initial debridement with wound VAC placement  Plan: Plan for debridement left lower leg with wound VAC placement    After a lengthy discussion of treatment options, including risks, benefits, alternatives, complications of surgical and nonsurgical conservative options, the patient elected surgical repair.    The patient  is aware of the material risks  and complications including, but not limited to injury to adjacent structures, neurovascular injury, infection, numbness, bleeding, implant failure, thermal burns, stiffness, persistent pain, failure to heal, disease transmission from allograft, need for further surgery, dislocation, anesthetic risks, blood clots, risks of death,and others. The probabilities of surgical success and failure discussed with patient given their particular co-morbidities.The time and nature of expected rehabilitation and recovery was discussed.The patient's questions were all answered preoperatively.  No barriers to understanding were noted. I explained the natural history of the disease process and Rx rationale.  I explained to the patient what I considered to be reasonable expectations given their personal situation.  The final treatment plan was arrived at through a shared patient decision making process model.   Thank you for the consult and the opportunity to see Mr. Heidi Lemay, MD [facility] 5:46 PM

## 2024-01-27 NOTE — Consult Note (Signed)
 Reason for Consult:Right lower leg hematoma Referring Physician: [place] Time called: 1419 Time at bedside: 1430   Charles Gray is an 68 y.o. male.  HPI: Drewey developed a spontaneous lower leg hematoma about a week ago 2/2 blood thinners. He was seen at [facility] where decision was made to treat this conservatively once anticoagulation had been reversed. He was discharge back to SNF but had the wound open up during dressing change this morning and he was sent to the ED here for evaluation.   Past Medical History:  Diagnosis Date   Acute on chronic congestive heart failure (HCC)    Acute on chronic diastolic CHF (congestive heart failure) (HCC) 08/23/2019   Acute on chronic right-sided heart failure (HCC) 08/06/2019   Acute respiratory failure with hypoxia (HCC)    Anemia of chronic disease    Atrial fibrillation (HCC) 12/23/2019   Atrial tachycardia 08/12/2019   Benign essential HTN    Chest pain 08/23/2019   Chronic anticoagulation 12/26/2019   Chronic heart failure with preserved EF 05/30/2019   Echo 07/2019: EF 55-60, GR 1 DD   Chronic pain syndrome    Cirrhosis of liver without ascites (HCC)    Class 2 severe obesity due to excess calories with serious comorbidity and body mass index (BMI) of 39.0 to 39.9 in adult 05/30/2019   Debility 03/13/2019   Drug induced constipation    Dyspnea    Fatty liver disease, nonalcoholic    GIB (gastrointestinal bleeding) 03/04/2019   Heart failure, systolic, acute (HCC) 08/24/2019   History of pulmonary embolism 08/12/2019   Hyperlipidemia 01/24/2019   Hypertension 01/24/2019   Hypoalbuminemia due to protein-calorie malnutrition    Hypokalemia    Hyponatremia    Hypotension due to drugs    Insomnia 01/24/2019   Lactic acidosis 08/23/2019   Lumbar disc herniation 01/24/2019   Mixed hyperlipidemia 01/24/2019   Morbid obesity with BMI of 40.0-44.9, adult (HCC) 05/30/2019   Normocytic anemia 06/08/2019   Occasional tremors 08/12/2019   OSA  (obstructive sleep apnea) 08/12/2019   Palpitations 12/26/2019   Peripheral edema    Personal history of DVT (deep vein thrombosis) 12/26/2019   Portal hypertensive gastropathy (HCC) 03/06/2019   Seen on EGD 03/06/2019   Pulmonary emboli (HCC) 06/08/2019   Pulmonary embolism (HCC) 06/08/2019   Redness and swelling of lower leg 08/23/2019   Shock (HCC)    Thrombocytopenia     Past Surgical History:  Procedure Laterality Date   BIOPSY  04/08/2021   Procedure: BIOPSY;  Surgeon: Aneita Gwendlyn DASEN, MD;  Location: [facility] ENDOSCOPY;  Service: Endoscopy;;   ESOPHAGOGASTRODUODENOSCOPY N/A 04/08/2021   Procedure: ESOPHAGOGASTRODUODENOSCOPY (EGD);  Surgeon: Aneita Gwendlyn DASEN, MD;  Location: [facility] ENDOSCOPY;  Service: Endoscopy;  Laterality: N/A;   ESOPHAGOGASTRODUODENOSCOPY (EGD) WITH PROPOFOL  N/A 03/05/2019   Procedure: ESOPHAGOGASTRODUODENOSCOPY (EGD) WITH PROPOFOL ;  Surgeon: Legrand Victory LITTIE DOUGLAS, MD;  Location: [facility] ENDOSCOPY;  Service: Gastroenterology;  Laterality: N/A;  Large Blood Clot Removed   ESOPHAGOGASTRODUODENOSCOPY (EGD) WITH PROPOFOL  N/A 03/06/2019   Procedure: ESOPHAGOGASTRODUODENOSCOPY (EGD) WITH PROPOFOL ;  Surgeon: Eda Iha, MD;  Location: [facility] ENDOSCOPY;  Service: Gastroenterology;  Laterality: N/A;   HEMORROIDECTOMY     PRESSURE SENSOR/CARDIOMEMS N/A 11/14/2020   Procedure: PRESSURE SENSOR/CARDIOMEMS;  Surgeon: Rolan Ezra RAMAN, MD;  Location: [facility] INVASIVE CV LAB;  Service: Cardiovascular;  Laterality: N/A;   RIGHT HEART CATH N/A 08/09/2019   Procedure: RIGHT HEART CATH;  Surgeon: Rolan Ezra RAMAN, MD;  Location: [facility] INVASIVE CV LAB;  Service: Cardiovascular;  Laterality: N/A;   RIGHT HEART CATH N/A 11/01/2020   Procedure: RIGHT HEART CATH;  Surgeon: Rolan Ezra RAMAN, MD;  Location: [facility] INVASIVE CV LAB;  Service: Cardiovascular;  Laterality: N/A;   RIGHT HEART CATH N/A 11/14/2020   Procedure: RIGHT HEART CATH;  Surgeon: Rolan Ezra RAMAN, MD;  Location: [facility] INVASIVE CV LAB;  Service: Cardiovascular;   Laterality: N/A;   RIGHT/LEFT HEART CATH AND CORONARY ANGIOGRAPHY N/A 05/10/2020   Procedure: RIGHT/LEFT HEART CATH AND CORONARY ANGIOGRAPHY;  Surgeon: Rolan Ezra RAMAN, MD;  Location: [facility] INVASIVE CV LAB;  Service: Cardiovascular;  Laterality: N/A;    Family History  Problem Relation Age of Onset   Hyperlipidemia Mother    Hypertension Mother    Stroke Mother    CAD Maternal Grandmother    CAD Maternal Grandfather     Social History:  reports that he has never smoked. He has never used smokeless tobacco. He reports that he does not currently use alcohol. He reports that he does not currently use drugs after having used the following drugs: Marijuana.  Allergies:  Allergies  Allergen Reactions   Penicillins Other (See Comments)    Caused convulsions as a child  Did it involve swelling of the face/tongue/throat, SOB, or low BP? N/A Did it involve sudden or severe rash/hives, skin peeling, or any reaction on the inside of your mouth or nose? N/A Did you need to seek medical attention at a hospital or doctor's office?N/A When did it last happen? N/A If all above answers are NO, may proceed with cephalosporin use.    Medications: I have reviewed the patient's current medications.  Results for orders placed or performed during the hospital encounter of 01/27/24 (from the past 48 hours)  CBC with Differential     Status: Abnormal   Collection Time: 01/27/24 12:56 PM  Result Value Ref Range   WBC 5.6 4.0 - 10.5 K/uL   RBC 3.66 (L) 4.22 - 5.81 MIL/uL   Hemoglobin 9.3 (L) 13.0 - 17.0 g/dL   HCT 69.0 (L) 60.9 - 47.9 %   MCV 84.4 80.0 - 100.0 fL   MCH 25.4 (L) 26.0 - 34.0 pg   MCHC 30.1 30.0 - 36.0 g/dL   RDW 83.2 (H) 88.4 - 84.4 %   Platelets 118 (L) 150 - 400 K/uL   nRBC 0.0 0.0 - 0.2 %   Neutrophils Relative % 79 %   Neutro Abs 4.4 1.7 - 7.7 K/uL   Lymphocytes Relative 11 %   Lymphs Abs 0.6 (L) 0.7 - 4.0 K/uL   Monocytes Relative 5 %   Monocytes Absolute 0.3 0.1 - 1.0 K/uL    Eosinophils Relative 4 %   Eosinophils Absolute 0.2 0.0 - 0.5 K/uL   Basophils Relative 0 %   Basophils Absolute 0.0 0.0 - 0.1 K/uL   Immature Granulocytes 1 %   Abs Immature Granulocytes 0.03 0.00 - 0.07 K/uL    Comment: Performed at [facility], [address], [place], [place] [address]  Protime-INR     Status: None   Collection Time: 01/27/24 12:56 PM  Result Value Ref Range   Prothrombin Time 13.9 11.4 - 15.2 seconds   INR 1.0 0.8 - 1.2    Comment: (NOTE) INR goal varies based on device and disease states. Performed at [facility], [address] [place], [place] [address]   Comprehensive metabolic panel     Status: Abnormal   Collection Time: 01/27/24 12:56 PM  Result Value  Ref Range   Sodium 140 135 - 145 mmol/L   Potassium 3.4 (L) 3.5 - 5.1 mmol/L   Chloride 103 98 - 111 mmol/L   CO2 24 22 - 32 mmol/L   Glucose, Bld 106 (H) 70 - 99 mg/dL    Comment: Glucose reference range applies only to samples taken after fasting for at least 8 hours.   BUN 14 8 - 23 mg/dL   Creatinine, Ser 9.17 0.61 - 1.24 mg/dL   Calcium 8.7 (L) 8.9 - 10.3 mg/dL   Total Protein 6.2 (L) 6.5 - 8.1 g/dL   Albumin  2.9 (L) 3.5 - 5.0 g/dL   AST 18 15 - 41 U/L   ALT 12 0 - 44 U/L   Alkaline Phosphatase 96 38 - 126 U/L   Total Bilirubin 0.8 0.0 - 1.2 mg/dL   GFR, Estimated >39 >39 mL/min    Comment: (NOTE) Calculated using the CKD-EPI Creatinine Equation (2021)    Anion gap 13 5 - 15    Comment: Performed at [facility], [address] [place], [place] [address]    No results found.  Review of Systems  HENT:  Negative for ear discharge, ear pain, hearing loss and tinnitus.   Eyes:  Negative for photophobia and pain.  Respiratory:  Negative for cough and shortness of breath.   Cardiovascular:  Negative for chest pain.  Gastrointestinal:  Negative for abdominal pain, nausea and vomiting.  Genitourinary:  Negative for dysuria, flank pain, frequency and urgency.   Musculoskeletal:  Positive for arthralgias (Left lower leg). Negative for back pain, myalgias and neck pain.  Neurological:  Negative for dizziness and headaches.  Hematological:  Does not bruise/bleed easily.  Psychiatric/Behavioral:  The patient is not nervous/anxious.    Blood pressure (!) 109/57, pulse 63, temperature 97.7 F (36.5 C), temperature source Oral, resp. rate 14, height 6' 10 (2.083 m), weight (!) 140.6 kg, SpO2 100%. Physical Exam Constitutional:      General: He is not in acute distress.    Appearance: He is well-developed. He is not diaphoretic.  HENT:     Head: Normocephalic and atraumatic.  Eyes:     General: No scleral icterus.       Right eye: No discharge.        Left eye: No discharge.     Conjunctiva/sclera: Conjunctivae normal.  Cardiovascular:     Rate and Rhythm: Normal rate and regular rhythm.  Pulmonary:     Effort: Pulmonary effort is normal. No respiratory distress.  Musculoskeletal:     Cervical back: Normal range of motion.     Comments: LLE Large anterior lower leg hematoma with overlying eschar, medial edge splint with extruded clot, e/o further clot superiorly and inferiorly, no rash  Mod TTP  No knee or ankle effusion  Knee stable to varus/ valgus and anterior/posterior stress  Sens DPN intact, SPN/TN minimal to absent  Motor EHL, ext, flex, evers 5/5  DP 2+, PT 2+, 2+ mildly pitting edema  Skin:    General: Skin is warm and dry.  Neurological:     Mental Status: He is alert.  Psychiatric:        Mood and Affect: Mood normal.        Behavior: Behavior normal.     Assessment/Plan: Left lower leg hematoma -- Will need I&D and VAC, possibly multiple trips, then eventual skin grafting. Plan for Dr. Genelle to do initial I&D tonight as long as medicine clears. This is not  an urgent procedure so if he needs time for medical optimization we can delay surgery.    Ozell DOROTHA Ned, PA-C Orthopedic Surgery [phone] 01/27/2024,  3:43 PM

## 2024-01-27 NOTE — Anesthesia Postprocedure Evaluation (Signed)
 Anesthesia Post Note  Patient: Charles Gray  Procedure(s) Performed: IRRIGATION AND DEBRIDEMENT WOUND (Left: Leg Lower) APPLICATION, WOUND VAC (Left: Leg Lower)     Patient location during evaluation: PACU Anesthesia Type: General Level of consciousness: awake and alert, oriented and patient cooperative Pain control: pain improving. Vital Signs Assessment: post-procedure vital signs reviewed and stable Respiratory status: spontaneous breathing, nonlabored ventilation, respiratory function stable and patient connected to nasal cannula oxygen Cardiovascular status: blood pressure returned to baseline and stable Postop Assessment: no apparent nausea or vomiting Anesthetic complications: no   No notable events documented.  Last Vitals:  Vitals:   01/27/24 1945 01/27/24 2017  BP: (!) 92/54 121/69  Pulse: 93 (!) 106  Resp: 16 20  Temp: 37.5 C 36.8 C  SpO2: 96% 98%                 Oriyah Lamphear,E. Mackenzie Lia

## 2024-01-27 NOTE — H&P (Signed)
 History and Physical    Charles Gray FMW:[id number] DOB: October 25, 1955 DOA: 01/27/2024  PCP: Sabas Norleen PARAS., MD   I have briefly reviewed patients previous medical reports in [facility].  Patient coming from: SNF  Chief Complaint: Left leg chronic hematoma, ruptured  HPI: Charles Gray is a 68 year old male, current resident of SNF, PMH of paroxysmal A-fib, COPD, cirrhosis, DVT/PE in 2021, chronic diastolic CHF, HTN, OSA, HLD, low back pain, anxiety, CAD, iron  deficiency anemia, recent hospitalization to [facility] due to spontaneous left lower extremity hematoma, active extravasation on CTA, IR and general surgery were consulted, it does not appear that he underwent any specific interventions, anticoagulation was reversed, anticoagulants were held, subsequently discharged back to SNF.  As per report, wound on his left leg opened up during dressing change this morning at SNF and he was sent to the ED for evaluation.  Patient reports left lower extremity pain, swelling, dark bloody drainage.  He denies fever, chills.  He reports abdominal distention and he is not sure if this is due to constipation or fluid buildup.  He denies prior history of paracentesis.  Last BM 2 days ago.  No nausea or vomiting.  Appears to have some chronic dyspnea without worsening.  No chest pain, palpitations, dizziness or lightheadedness.  ED Course: Afebrile and vital signs stable.  Lab work significant for potassium 3.4, albumin  2.9, total protein 6.2, hemoglobin 9.3 and platelets 118.  INR 1.  In ED, received a dose of Oxy IR.  Orthopedics were consulted, recommended I&D and VAC, possible multiple trips to the OR and eventual skin grafting.  Review of Systems:  All other systems reviewed and apart from HPI, are negative.  Patient reports that he sustained a right shoulder fracture several months ago and has limited movements of right shoulder since then.  Past Medical History:  Diagnosis Date   Acute on  chronic congestive heart failure (HCC)    Acute on chronic diastolic CHF (congestive heart failure) (HCC) 08/23/2019   Acute on chronic right-sided heart failure (HCC) 08/06/2019   Acute respiratory failure with hypoxia (HCC)    Anemia of chronic disease    Atrial fibrillation (HCC) 12/23/2019   Atrial tachycardia 08/12/2019   Benign essential HTN    Chest pain 08/23/2019   Chronic anticoagulation 12/26/2019   Chronic heart failure with preserved EF 05/30/2019   Echo 07/2019: EF 55-60, GR 1 DD   Chronic pain syndrome    Cirrhosis of liver without ascites (HCC)    Class 2 severe obesity due to excess calories with serious comorbidity and body mass index (BMI) of 39.0 to 39.9 in adult 05/30/2019   Debility 03/13/2019   Drug induced constipation    Dyspnea    Fatty liver disease, nonalcoholic    GIB (gastrointestinal bleeding) 03/04/2019   Heart failure, systolic, acute (HCC) 08/24/2019   History of pulmonary embolism 08/12/2019   Hyperlipidemia 01/24/2019   Hypertension 01/24/2019   Hypoalbuminemia due to protein-calorie malnutrition    Hypokalemia    Hyponatremia    Hypotension due to drugs    Insomnia 01/24/2019   Lactic acidosis 08/23/2019   Lumbar disc herniation 01/24/2019   Mixed hyperlipidemia 01/24/2019   Morbid obesity with BMI of 40.0-44.9, adult (HCC) 05/30/2019   Normocytic anemia 06/08/2019   Occasional tremors 08/12/2019   OSA (obstructive sleep apnea) 08/12/2019   Palpitations 12/26/2019   Peripheral edema    Personal history of DVT (deep vein thrombosis) 12/26/2019   Portal hypertensive gastropathy (HCC) 03/06/2019  Seen on EGD 03/06/2019   Pulmonary emboli (HCC) 06/08/2019   Pulmonary embolism (HCC) 06/08/2019   Redness and swelling of lower leg 08/23/2019   Shock (HCC)    Thrombocytopenia     Past Surgical History:  Procedure Laterality Date   BIOPSY  04/08/2021   Procedure: BIOPSY;  Surgeon: Aneita Gwendlyn DASEN, MD;  Location: [facility] ENDOSCOPY;  Service: Endoscopy;;    ESOPHAGOGASTRODUODENOSCOPY N/A 04/08/2021   Procedure: ESOPHAGOGASTRODUODENOSCOPY (EGD);  Surgeon: Aneita Gwendlyn DASEN, MD;  Location: [facility] ENDOSCOPY;  Service: Endoscopy;  Laterality: N/A;   ESOPHAGOGASTRODUODENOSCOPY (EGD) WITH PROPOFOL  N/A 03/05/2019   Procedure: ESOPHAGOGASTRODUODENOSCOPY (EGD) WITH PROPOFOL ;  Surgeon: Legrand Victory LITTIE DOUGLAS, MD;  Location: [facility] ENDOSCOPY;  Service: Gastroenterology;  Laterality: N/A;  Large Blood Clot Removed   ESOPHAGOGASTRODUODENOSCOPY (EGD) WITH PROPOFOL  N/A 03/06/2019   Procedure: ESOPHAGOGASTRODUODENOSCOPY (EGD) WITH PROPOFOL ;  Surgeon: Eda Iha, MD;  Location: [facility] ENDOSCOPY;  Service: Gastroenterology;  Laterality: N/A;   HEMORROIDECTOMY     PRESSURE SENSOR/CARDIOMEMS N/A 11/14/2020   Procedure: PRESSURE SENSOR/CARDIOMEMS;  Surgeon: Rolan Ezra RAMAN, MD;  Location: [facility] INVASIVE CV LAB;  Service: Cardiovascular;  Laterality: N/A;   RIGHT HEART CATH N/A 08/09/2019   Procedure: RIGHT HEART CATH;  Surgeon: Rolan Ezra RAMAN, MD;  Location: [facility] INVASIVE CV LAB;  Service: Cardiovascular;  Laterality: N/A;   RIGHT HEART CATH N/A 11/01/2020   Procedure: RIGHT HEART CATH;  Surgeon: Rolan Ezra RAMAN, MD;  Location: [facility] INVASIVE CV LAB;  Service: Cardiovascular;  Laterality: N/A;   RIGHT HEART CATH N/A 11/14/2020   Procedure: RIGHT HEART CATH;  Surgeon: Rolan Ezra RAMAN, MD;  Location: [facility] INVASIVE CV LAB;  Service: Cardiovascular;  Laterality: N/A;   RIGHT/LEFT HEART CATH AND CORONARY ANGIOGRAPHY N/A 05/10/2020   Procedure: RIGHT/LEFT HEART CATH AND CORONARY ANGIOGRAPHY;  Surgeon: Rolan Ezra RAMAN, MD;  Location: [facility] INVASIVE CV LAB;  Service: Cardiovascular;  Laterality: N/A;    Social History  reports that he has never smoked. He has never used smokeless tobacco. He reports that he does not currently use alcohol. He reports that he does not currently use drugs after having used the following drugs: Marijuana.  Allergies  Allergen Reactions   Penicillins Other (See  Comments)    Caused convulsions as a child  Did it involve swelling of the face/tongue/throat, SOB, or low BP? N/A Did it involve sudden or severe rash/hives, skin peeling, or any reaction on the inside of your mouth or nose? N/A Did you need to seek medical attention at a hospital or doctor's office?N/A When did it last happen? N/A If all above answers are NO, may proceed with cephalosporin use.    Family History  Problem Relation Age of Onset   Hyperlipidemia Mother    Hypertension Mother    Stroke Mother    CAD Maternal Grandmother    CAD Maternal Grandfather      Prior to Admission medications   Medication Sig Start Date End Date Taking? Authorizing Provider  apixaban  (ELIQUIS ) 5 MG TABS tablet Take 1 tablet (5 mg total) by mouth 2 (two) times daily. 11/19/21   Bensimhon, Toribio SAUNDERS, MD  bacitracin  ointment Apply topically daily. 11/05/23   Tobie Yetta HERO, MD  eplerenone  (INSPRA ) 50 MG tablet TAKE TWO TABLETS BY MOUTH DAILY 10/13/23   Glena Harlene HERO, FNP  ferrous sulfate  325 (65 FE) MG tablet Take 1 tablet (325 mg total) by mouth daily with breakfast. 11/05/23   Tobie Yetta HERO, MD  gabapentin  (NEURONTIN ) 800 MG tablet Take 1  tablet (800 mg total) by mouth 3 (three) times daily. 11/04/23   Patel, Pranav M, MD  hydrocerin (EUCERIN) CREA Apply 1 Application topically 2 (two) times daily. 11/04/23   Tobie Yetta HERO, MD  lactulose , encephalopathy, (CHRONULAC ) 10 GM/15ML SOLN Take 30 mLs (20 g total) by mouth 2 (two) times daily. Patient taking differently: Take 20 g by mouth 2 (two) times daily as needed. 03/11/22   Fairy Frames, MD  methocarbamol  (ROBAXIN ) 500 MG tablet Take 1 tablet (500 mg total) by mouth 3 (three) times daily. 11/04/23   Tobie Yetta HERO, MD  metolazone  (ZAROXOLYN ) 2.5 MG tablet Take 1 tablet (2.5 mg total) by mouth 3 (three) times a week. on Mondays Wednesdays and Fridays 05/28/23   Rolan Ezra RAMAN, MD  metoprolol  succinate (TOPROL -XL) 25 MG 24 hr tablet TAKE ONE  TABLET BY MOUTH AT BEDTIME 03/23/23   McLean, Dalton S, MD  ondansetron  (ZOFRAN -ODT) 4 MG disintegrating tablet Take 4 mg by mouth every 8 (eight) hours as needed for nausea or vomiting (dissolve orally).    [provider]  oxyCODONE  (ROXICODONE ) 15 MG immediate release tablet Take 1 tablet (15 mg total) by mouth every 4 (four) hours as needed for severe pain (pain score 7-10) or moderate pain (pain score 4-6). 11/04/23   Tobie Yetta HERO, MD  pantoprazole  (PROTONIX ) 40 MG tablet Take 40 mg by mouth 2 (two) times daily before a meal.    [provider]  potassium chloride  (KLOR-CON ) 20 MEQ packet Take 100 mEq by mouth 3 (three) times daily. Take an extra 40 mEq on days when you take metolazone  05/11/22   Rolan Ezra RAMAN, MD  pravastatin  (PRAVACHOL ) 20 MG tablet Take 1 tablet by mouth daily. Patient taking differently: Take 20 mg by mouth at bedtime. 09/16/23   Rolan Ezra RAMAN, MD  sertraline  (ZOLOFT ) 100 MG tablet Take 100 mg by mouth at bedtime. At night    [provider]  tamsulosin  (FLOMAX ) 0.4 MG CAPS capsule Take 1 capsule (0.4 mg total) by mouth daily. Additional refills will need to come from PCP 04/01/22   Rolan Ezra RAMAN, MD  torsemide  (DEMADEX ) 100 MG tablet TAKE ONE TABLET BY MOUTH DAILY IN THE MORNING AND EVENING 09/16/23   Rolan Ezra RAMAN, MD  triamcinolone  (KENALOG ) 0.025 % cream Apply 1 Application topically 3 (three) times daily as needed (skin irritation). 08/25/23   [provider]  VENTOLIN  HFA 108 (90 Base) MCG/ACT inhaler Inhale 1 puff into the lungs every 4 (four) hours as needed for shortness of breath. Patient taking differently: Inhale 2 puffs into the lungs every 4 (four) hours as needed for shortness of breath. 03/23/19   Angiulli, Toribio PARAS, PA-C  zolpidem  (AMBIEN ) 10 MG tablet Take 1 tablet (10 mg total) by mouth at bedtime as needed for sleep. 11/04/23   Tobie Yetta HERO, MD  bumetanide  (BUMEX ) 1 MG tablet Take 4 tablets (4 mg total) by mouth  daily with breakfast AND 3 tablets (3 mg total) every evening. 05/10/20 06/13/20  Rolan Ezra RAMAN, MD  metoprolol  tartrate (LOPRESSOR ) 50 MG tablet Take 1 tablet (50 mg total) by mouth 2 (two) times daily. 02/09/20 04/11/20  Kathrin Mignon DASEN, MD    Physical Exam: Vitals:   01/27/24 1239 01/27/24 1241 01/27/24 1515 01/27/24 1703  BP:  129/74 (!) 109/57 123/73  Pulse:  76 63 67  Resp:  12 14 16   Temp:  97.7 F (36.5 C)  98.6 F (37 C)  TempSrc: Oral Oral  Oral  SpO2:  100% 100% 98%  Weight: (!) 140.6 kg     Height: 6' 10 (2.083 m)         Constitutional: Middle-age male, moderately built and obese lying comfortably propped up in bed in preop area.  Multiple nurses attending to him.  Appeared comfortable and in no obvious respiratory or painful distress. Eyes: PERTLA, lids and conjunctivae normal ENMT: Mucous membranes are moist. Posterior pharynx clear of any exudate or lesions.  Edentulous.  Neck: supple, no masses, no thyromegaly Respiratory: Clear to auscultation without wheezing, rhonchi or crackles. No increased work of breathing. Cardiovascular: S1 & S2 heard, irregularly regular rate and rhythm. No JVD, murmurs, rubs or clicks. No pedal edema.  Telemetry at bedside personally reviewed: Appears to be A-fib with controlled ventricular rate. Abdomen: Abdomen is obese or mildly distended, no obvious tenderness.  Tympanitic.  No clear ascites appreciated.  No organomegaly or masses appreciated.  Normal bowel sounds heard. Musculoskeletal: no clubbing / cyanosis. No joint deformity upper and lower extremities. Good ROM, no contractures. Normal muscle tone.  Skin: no rashes, lesions, ulcers. No induration Neurologic: CN 2-12 grossly intact. Sensation intact, DTR normal. Strength 5/5 in all 4 limbs.  Psychiatric: Normal judgment and insight. Alert and oriented x 3. Normal mood.   Lower extremities Left leg has a large hematoma with a large dark scab on top, medial/posterior aspect of the  hematoma has ruptured extruding some old clot.  No active fresh bleeding noted.  Details as in picture below from day of admission.  Neurovascular bundle intact. Right leg with hyperpigmentation          Labs on Admission: I have personally reviewed following labs and imaging studies  CBC: Recent Labs  Lab 01/27/24 1256  WBC 5.6  NEUTROABS 4.4  HGB 9.3*  HCT 30.9*  MCV 84.4  PLT 118*    Basic Metabolic Panel: Recent Labs  Lab 01/27/24 1256  NA 140  K 3.4*  CL 103  CO2 24  GLUCOSE 106*  BUN 14  CREATININE 0.82  CALCIUM 8.7*    Liver Function Tests: Recent Labs  Lab 01/27/24 1256  AST 18  ALT 12  ALKPHOS 96  BILITOT 0.8  PROT 6.2*  ALBUMIN  2.9*    Urine analysis:    Component Value Date/Time   COLORURINE STRAW (A) 10/24/2023 1954   APPEARANCEUR CLEAR 10/24/2023 1954   LABSPEC 1.008 10/24/2023 1954   PHURINE 7.0 10/24/2023 1954   GLUCOSEU NEGATIVE 10/24/2023 1954   HGBUR NEGATIVE 10/24/2023 1954   BILIRUBINUR NEGATIVE 10/24/2023 1954   KETONESUR NEGATIVE 10/24/2023 1954   PROTEINUR NEGATIVE 10/24/2023 1954   NITRITE NEGATIVE 10/24/2023 1954   LEUKOCYTESUR NEGATIVE 10/24/2023 1954     Radiological Exams on Admission: No results found.   Assessment/Plan Principal Problem:   Leg hematoma Active Problems:   History of pulmonary embolism   Paroxysmal atrial fibrillation (HCC)   COPD (chronic obstructive pulmonary disease) (HCC)   Lymphedema   Essential hypertension   Mixed hyperlipidemia   Thrombocytopenia   Cirrhosis of liver without ascites (HCC)   Chronic heart failure with preserved ejection fraction (HCC)   Chronic venous stasis dermatitis of both lower extremities     Left leg hematoma, chronic Upon extensive review from care everywhere, unable to see a proper DC summary.  Did review some of the IR and general surgery notes.  It appears that he initially had a right leg hematoma followed by a left leg hematoma. This was in  the  context of anticoagulation which is likely held at this time.  Awaiting home medications to be reconciled by pharmacy. I was advised by EDP that patient would be taken to the OR later this evening but within 5 minutes of me speaking with the ED PA and going down to the OR, patient had already been moved to preop area.  Evaluated patient in preop. Currently n.p.o. and awaiting I&D and VAC placement by orthopedics.  As per orthopedics, he may need multiple trips to the OR. Based on data available, patient is probably at moderate risk for perioperative CV events but appears stable without any need for further CV evaluation and may proceed with indicated surgery with close perioperative monitoring. Multimodality pain control.  Hypokalemia Replace and follow  Normocytic anemia Appears to have chronic anemia with most recent hemoglobin at OSH in the 9 g range. Stable.  Follow CBCs daily  Thrombocytopenia Chronic and stable  Paroxysmal A-fib Chronic diastolic CHF HTN CAD HLD Rate controlled and stable. Resume appropriate home meds after reconciliation by pharmacy Monitor on telemetry Follows with Dr. Rolan, cardiology.  COPD OSA Stable.  Patient indicates that he is not on oxygen or CPAP  History of DVT/PE Likely not on anticoagulation at this time due to left leg hematoma.  Await home med rec completion  Cirrhosis Unclear if he has some degree of ascites versus abdominal protuberance from obesity. Overall appears compensated  Constipation Initiated bowel regimen  Body mass index is 32.41 kg/m./Class I obesity Complicates care.  Outpatient follow-up.    DVT prophylaxis: None at this time but this has to be reassessed tomorrow postop. Code Status: Full, confirmed with patient in preop area along with nursing at bedside. Family Communication: None at bedside. Disposition Plan:   Patient is from:  SNF  Anticipated DC to: SNF  Anticipated DC date: Unclear at this time.  May  need multiple trips to the OR.  Anticipated DC barriers: None   Consults called: Orthopedics Admission status: Inpatient, cardiac telemetry  Severity of Illness: The appropriate patient status for this patient is INPATIENT. Inpatient status is judged to be reasonable and necessary in order to provide the required intensity of service to ensure the patient's safety. The patient's presenting symptoms, physical exam findings, and initial radiographic and laboratory data in the context of their chronic comorbidities is felt to place them at high risk for further clinical deterioration. Furthermore, it is not anticipated that the patient will be medically stable for discharge from the hospital within 2 midnights of admission.   * I certify that at the point of admission it is my clinical judgment that the patient will require inpatient hospital care spanning beyond 2 midnights from the point of admission due to high intensity of service, high risk for further deterioration and high frequency of surveillance required.*    Trenda Mar MD FACP, [facility], [facility], [facility]   [facility] [place]    To contact the attending provider between 7A-7P or the covering provider during after hours 7P-7A, please log into the web site [email or web address] and access using universal [place] password for that web site. If you do not have the password, please call the hospital operator.  01/27/2024, 5:59 PM

## 2024-01-27 NOTE — Brief Op Note (Signed)
   Brief Op Note  Date of Surgery: 01/27/2024  Preoperative Diagnosis: Left Lower Leg Wound  Postoperative Diagnosis: same  Procedure: Procedure(s): IRRIGATION AND DEBRIDEMENT WOUND APPLICATION, WOUND VAC  Implants: Implant Name Type Inv. Item Serial No. Manufacturer Lot No. LRB No. Used Action  GRAFT SKIN WND SURGICLOSE M95 - [id number] Tissue GRAFT SKIN WND [place]  [employer] [id number] Left 1 Implanted    Surgeons: Surgeon(s): Genelle Standing, MD  Anesthesia: General    Estimated Blood Loss: See anesthesia record  Complications: None  Condition to PACU: Stable  Standing LITTIE Genelle, MD 01/27/2024 6:47 PM

## 2024-01-28 ENCOUNTER — Encounter (HOSPITAL_COMMUNITY): Payer: Self-pay | Admitting: Orthopaedic Surgery

## 2024-01-28 LAB — CBC
HCT: 25.4 % — ABNORMAL LOW (ref 39.0–52.0)
Hemoglobin: 8 g/dL — ABNORMAL LOW (ref 13.0–17.0)
MCH: 25.7 pg — ABNORMAL LOW (ref 26.0–34.0)
MCHC: 31.5 g/dL (ref 30.0–36.0)
MCV: 81.7 fL (ref 80.0–100.0)
Platelets: 148 K/uL — ABNORMAL LOW (ref 150–400)
RBC: 3.11 MIL/uL — ABNORMAL LOW (ref 4.22–5.81)
RDW: 16.6 % — ABNORMAL HIGH (ref 11.5–15.5)
WBC: 9.7 K/uL (ref 4.0–10.5)
nRBC: 0 % (ref 0.0–0.2)

## 2024-01-28 MED ORDER — PRAVASTATIN SODIUM 40 MG PO TABS
20.0000 mg | ORAL_TABLET | Freq: Every day | ORAL | Status: DC
  Administered 2024-01-28 – 2024-02-04 (×7): 20 mg via ORAL
  Filled 2024-01-28 (×8): qty 1

## 2024-01-28 MED ORDER — OXYCODONE HCL 5 MG PO TABS
15.0000 mg | ORAL_TABLET | ORAL | Status: DC | PRN
  Administered 2024-01-28 – 2024-02-04 (×22): 15 mg via ORAL
  Filled 2024-01-28 (×22): qty 3

## 2024-01-28 MED ORDER — SERTRALINE HCL 100 MG PO TABS
100.0000 mg | ORAL_TABLET | Freq: Every day | ORAL | Status: DC
  Administered 2024-01-28 – 2024-02-03 (×7): 100 mg via ORAL
  Filled 2024-01-28 (×7): qty 1

## 2024-01-28 MED ORDER — HYDROMORPHONE HCL 1 MG/ML IJ SOLN
1.0000 mg | INTRAMUSCULAR | Status: DC | PRN
  Administered 2024-01-28: 1 mg via INTRAVENOUS
  Filled 2024-01-28: qty 1

## 2024-01-28 MED ORDER — METOPROLOL SUCCINATE ER 25 MG PO TB24
25.0000 mg | ORAL_TABLET | Freq: Every day | ORAL | Status: DC
  Administered 2024-01-28 – 2024-02-04 (×7): 25 mg via ORAL
  Filled 2024-01-28 (×8): qty 1

## 2024-01-28 MED ORDER — OXYCODONE HCL 5 MG PO TABS: 5.0000 mg | ORAL_TABLET | ORAL | Status: DC | PRN

## 2024-01-28 MED ORDER — OXYCODONE HCL 5 MG PO TABS: 15.0000 mg | ORAL_TABLET | ORAL | Status: DC | PRN

## 2024-01-28 MED ORDER — OXYCODONE HCL 5 MG PO TABS
10.0000 mg | ORAL_TABLET | ORAL | Status: DC | PRN
  Administered 2024-01-28: 10 mg via ORAL
  Filled 2024-01-28: qty 2

## 2024-01-28 MED ORDER — HYDROMORPHONE HCL 1 MG/ML IJ SOLN
0.5000 mg | Freq: Once | INTRAMUSCULAR | Status: AC
  Administered 2024-01-28: 0.5 mg via INTRAVENOUS
  Filled 2024-01-28: qty 0.5

## 2024-01-28 MED ORDER — ZOLPIDEM TARTRATE 5 MG PO TABS
5.0000 mg | ORAL_TABLET | Freq: Once | ORAL | Status: AC
  Administered 2024-01-28: 5 mg via ORAL
  Filled 2024-01-28: qty 1

## 2024-01-28 MED ORDER — ZOLPIDEM TARTRATE 5 MG PO TABS
5.0000 mg | ORAL_TABLET | Freq: Every evening | ORAL | Status: AC | PRN
  Administered 2024-01-28 – 2024-01-30 (×2): 5 mg via ORAL
  Filled 2024-01-28 (×3): qty 1

## 2024-01-28 MED ORDER — HYDROMORPHONE HCL 1 MG/ML IJ SOLN
2.0000 mg | INTRAMUSCULAR | Status: DC | PRN
  Administered 2024-01-28 – 2024-02-04 (×27): 2 mg via INTRAVENOUS
  Filled 2024-01-28 (×27): qty 2

## 2024-01-28 NOTE — Evaluation (Signed)
 Occupational Therapy Evaluation Patient Details Name: Charles Gray MRN: [id number] DOB: 1955-07-14 Today's Date: 01/28/2024   History of Present Illness   Pt is a 68 y.o. male admitted 01/27/24 for left leg pressure ulcer from hematoma, necrotic including subcutaneous tissue down to fascia. Pt s/p I&D and wound vac application 10/16. PMHx: paroxysmal A-fib, COPD, cirrhosis, DVT/PE in 2021, chronic diastolic CHF, HTN, OSA, GERD, HLD, low back pain, anxiety, CAD, and iron  deficiency anemia. Of note, recent hospitalization to [facility] d/t LLE hematoma with extravasation.     Clinical Impressions Pt reports walking with a RW in PT at SNF, transferring with supervision on and off BSC and needing assistance for LB ADLs and pericare. Pt presents with multiple physical complaints, L LE pain for which he received IV pain meds during session, generalized weakness and decreased activity tolerance. Pt declined sitting EOB and OOB activities. Rolled at bed level with +2 moderate assistance. Pt needs set up to total assist for ADLs. Educated pt in benefits of early mobilization, agreed to bed placed in chair position at end of session. Patient will benefit from continued inpatient follow up therapy, <3 hours/day      If plan is discharge home, recommend the following:   Two people to help with walking and/or transfers;Two people to help with bathing/dressing/bathroom;Assistance with cooking/housework;Assist for transportation;Help with stairs or ramp for entrance     Functional Status Assessment   Patient has had a recent decline in their functional status and demonstrates the ability to make significant improvements in function in a reasonable and predictable amount of time.     Equipment Recommendations   Other (comment) (defer)     Recommendations for Other Services         Precautions/Restrictions   Precautions Precautions: Fall Precaution/Restrictions Comments: Wound Vac  LLE Restrictions Weight Bearing Restrictions Per Provider Order: Yes LLE Weight Bearing Per Provider Order: Weight bearing as tolerated     Mobility Bed Mobility Overal bed mobility: Needs Assistance Bed Mobility: Rolling Rolling: +2 for physical assistance, Mod assist         General bed mobility comments: cues to self assist with use of rail and pushing R foot into bed    Transfers                   General transfer comment: pt adamantly declined after IV pain medication      Balance                                           ADL either performed or assessed with clinical judgement   ADL Overall ADL's : Needs assistance/impaired Eating/Feeding: Independent;Bed level   Grooming: Oral care;Wash/dry hands;Wash/dry face;Bed level;Set up   Upper Body Bathing: Maximal assistance;Bed level   Lower Body Bathing: Total assistance;+2 for physical assistance;Bed level   Upper Body Dressing : Minimal assistance;Bed level   Lower Body Dressing: Total assistance;Bed level;+2 for physical assistance       Toileting- Clothing Manipulation and Hygiene: +2 for physical assistance;Total assistance;Bed level               Vision Ability to See in Adequate Light: 0 Adequate Patient Visual Report: No change from baseline       Perception         Praxis         Pertinent Vitals/Pain Pain Assessment Pain Assessment:  0-10 Pain Score: 9  Pain Location: LLE Pain Descriptors / Indicators: Grimacing, Moaning, Discomfort Pain Intervention(s): RN gave pain meds during session, Repositioned, Monitored during session     Extremity/Trunk Assessment Upper Extremity Assessment Upper Extremity Assessment: Right hand dominant;RUE deficits/detail RUE Deficits / Details: longstanding limited shoulder from prior injury RUE Coordination: decreased gross motor   Lower Extremity Assessment Lower Extremity Assessment: Defer to PT evaluation   Cervical /  Trunk Assessment Cervical / Trunk Assessment: Other exceptions Cervical / Trunk Exceptions: obesity, weakness   Communication Communication Communication: No apparent difficulties   Cognition Arousal: Alert Behavior During Therapy: Flat affect Cognition: No apparent impairments             OT - Cognition Comments: intensely focused on his pain and various ailments                 Following commands: Intact       Cueing  General Comments   Cueing Techniques: Verbal cues      Exercises     Shoulder Instructions      Home Living Family/patient expects to be discharged to:: Skilled nursing facility                                 Additional Comments: sleeps in recliner      Prior Functioning/Environment Prior Level of Function : Needs assist         Mobility (physical): Gait;Stairs ADLs (physical): Bathing;Dressing;Toileting;IADLs Mobility Comments: Pt reports getting OOB on his own. Transfer via stand pivot using RW on his own. Reports he was working on ambulation and stairs with PT using AD/rails. Sounds like PT was providing supervision, no hands on. 1 fall in July, resulting in hospitalization and sustaining RUE fx. ADLs Comments: Pt reports he could go to the [facility] on his own, but wasn't able to reach around and wipe himself yet. Needed asist for pericare. Pt states he was bathing sitting in a shower chair with staff assist to manage LB but could manage UB with set-up assist. Pt reports he could dress his UB but needed help with LB.    OT Problem List: Decreased strength;Decreased activity tolerance;Obesity;Impaired UE functional use;Pain   OT Treatment/Interventions: Self-care/ADL training      OT Goals(Current goals can be found in the care plan section)   Acute Rehab OT Goals OT Goal Formulation: With patient Time For Goal Achievement: 02/11/24 Potential to Achieve Goals: Good ADL Goals Pt Will Perform Grooming: with  set-up;sitting Pt Will Perform Upper Body Dressing: with set-up;sitting Pt Will Transfer to Toilet: with min assist;stand pivot transfer;bedside commode Additional ADL Goal #1: Pt will complete bed mobility with moderate assistance in preparation for ADLs.   OT Frequency:  Min 2X/week    Co-evaluation PT/OT/SLP Co-Evaluation/Treatment: Yes Reason for Co-Treatment: For patient/therapist safety   OT goals addressed during session: ADL's and self-care      AM-PAC OT 6 Clicks Daily Activity     Outcome Measure Help from another person eating meals?: None Help from another person taking care of personal grooming?: A Little Help from another person toileting, which includes using toliet, bedpan, or urinal?: Total Help from another person bathing (including washing, rinsing, drying)?: A Lot Help from another person to put on and taking off regular upper body clothing?: A Little Help from another person to put on and taking off regular lower body clothing?: Total 6 Click Score: 14  End of Session Nurse Communication: Patient requests pain meds  Activity Tolerance: Patient limited by pain Patient left: in bed;with call bell/phone within reach;with bed alarm set  OT Visit Diagnosis: Muscle weakness (generalized) (M62.81);Pain Pain - Right/Left: Left Pain - part of body: Leg                Time: 9154-9077 OT Time Calculation (min): 37 min Charges:  OT General Charges $OT Visit: 1 Visit OT Evaluation $OT Eval Moderate Complexity: 1 Mod  Mliss HERO, OTR/L Acute Rehabilitation Services Office: [phone]   Kennth Mliss Helling 01/28/2024, 9:40 AM

## 2024-01-28 NOTE — Progress Notes (Addendum)
 PHARMACY - ANTICOAGULATION CONSULT NOTE  Pharmacy Consult for enoxaparin Indication: hx DVT   Allergies  Allergen Reactions   Penicillins Other (See Comments)    Childhood reaction - convulsions     Patient Measurements: Height: 6' 10 (208.3 cm) Weight: (!) 140.6 kg (310 lb) IBW/kg (Calculated) : 100.6 HEPARIN  DW (KG): 130.2  Vital Signs: Temp: 99.3 F (37.4 C) (10/17 1526) Temp Source: Oral (10/17 1526) BP: 121/60 (10/17 1526) Pulse Rate: 79 (10/17 1526)  Labs: Recent Labs    01/27/24 1256 01/28/24 0428  HGB 9.3* 8.0*  HCT 30.9* 25.4*  PLT 118* 148*  LABPROT 13.9  --   INR 1.0  --   CREATININE 0.82  --     Estimated Creatinine Clearance: 142.2 mL/min (by C-G formula based on SCr of 0.82 mg/dL).   Medical History: Past Medical History:  Diagnosis Date   Acute on chronic congestive heart failure (HCC)    Acute on chronic diastolic CHF (congestive heart failure) (HCC) 08/23/2019   Acute on chronic right-sided heart failure (HCC) 08/06/2019   Acute respiratory failure with hypoxia (HCC)    Anemia of chronic disease    Atrial fibrillation (HCC) 12/23/2019   Atrial tachycardia 08/12/2019   Benign essential HTN    Chest pain 08/23/2019   Chronic anticoagulation 12/26/2019   Chronic heart failure with preserved EF 05/30/2019   Echo 07/2019: EF 55-60, GR 1 DD   Chronic pain syndrome    Cirrhosis of liver without ascites (HCC)    Class 2 severe obesity due to excess calories with serious comorbidity and body mass index (BMI) of 39.0 to 39.9 in adult 05/30/2019   Debility 03/13/2019   Drug induced constipation    Dyspnea    Fatty liver disease, nonalcoholic    GIB (gastrointestinal bleeding) 03/04/2019   Heart failure, systolic, acute (HCC) 08/24/2019   History of pulmonary embolism 08/12/2019   Hyperlipidemia 01/24/2019   Hypertension 01/24/2019   Hypoalbuminemia due to protein-calorie malnutrition    Hypokalemia    Hyponatremia    Hypotension due to drugs     Insomnia 01/24/2019   Lactic acidosis 08/23/2019   Lumbar disc herniation 01/24/2019   Mixed hyperlipidemia 01/24/2019   Morbid obesity with BMI of 40.0-44.9, adult (HCC) 05/30/2019   Normocytic anemia 06/08/2019   Occasional tremors 08/12/2019   OSA (obstructive sleep apnea) 08/12/2019   Palpitations 12/26/2019   Peripheral edema    Personal history of DVT (deep vein thrombosis) 12/26/2019   Portal hypertensive gastropathy (HCC) 03/06/2019   Seen on EGD 03/06/2019   Pulmonary emboli (HCC) 06/08/2019   Pulmonary embolism (HCC) 06/08/2019   Redness and swelling of lower leg 08/23/2019   Shock (HCC)    Thrombocytopenia      Assessment: 68 yo M with hx afib and VTE with PE 2021. Admitted with recurrent hematoma even after stoping eliquis , now s/p evacuation 10/16. Ortho ok to start enoxaparin. Last dose apixaban  01/17/24 21:00. Pharmacy consulted for enoxaparin     ClCr 142 ml/min. BMI 32.  Goal of Therapy:  Monitor platelets by anticoagulation protocol: Yes   Plan:  Enoxaparin 140mg  q12hr Monitor signs/symptoms of bleeding    Jinnie Door, PharmD, BCPS, BCCP Clinical Pharmacist  Please check AMION for all [facility] Pharmacy phone numbers After 10:00 PM, call Main Pharmacy [phone]

## 2024-01-28 NOTE — Progress Notes (Signed)
   Subjective:  Patient in significant pain overnight.  Pain dose regimen increased to allow for more comfort.  Approximate 150 cc output in the wound VAC which has been stable.  Objective:   VITALS:   Vitals:   01/27/24 2017 01/27/24 2352 01/28/24 0419 01/28/24 0714  BP: 121/69 135/76 130/82 127/69  Pulse: (!) 106 100  83  Resp: 20 20 19 16   Temp: 98.2 F (36.8 C) 98.9 F (37.2 C) 98.7 F (37.1 C) 97.9 F (36.6 C)  TempSrc: Oral Oral Oral Oral  SpO2: 98% 97%  99%  Weight:      Height:        Left lower extremity wound VAC is clean dry intact holding good suction seal.  Remainder of distal nerve lower extremity exam remains intact.  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  Hemoglobin does appear to be at its baseline - Patient to work with PT to optimize mobilization safely as well as frequent bed turns given his limited mobility and habitus - DVT ppx - SCDs, ambulation, patient on baseline Eliquis  due to A-fib/DVT.  Would recommend Lovenox treatment given this as this can be quickly discontinued in preparation for future surgical debridements.  Recommend Lovenox dosing per pharmacy - No evidence of infection at this time - WBAT operative extremity - Pain control - multimodal pain management, ATC acetaminophen  in conjunction with as needed narcotic (oxycodone ), although this should be minimized with other modalities  - Discharge planning pending CM, appreciate coordination   Malillany Kazlauskas 01/28/2024, 7:15 AM

## 2024-01-28 NOTE — Progress Notes (Signed)
 PROGRESS NOTE    Charles Gray  FMW:[id number]  DOB: [birth year]  DOA: 01/27/2024 PCP: Sabas Norleen PARAS., MD Outpatient Specialists:   Hospital course:  Charles Gray is a 68 year old male, current resident of SNF, PMH of paroxysmal A-fib, COPD, cirrhosis, DVT/PE in 2021, chronic diastolic CHF, HTN, OSA, HLD, low back pain, anxiety, CAD, iron  deficiency anemia, recent hospitalization to [facility] due to spontaneous left lower extremity hematoma, active extravasation on CTA, IR and general surgery were consulted, it does not appear that he underwent any specific interventions, anticoagulation was reversed, anticoagulants were held, subsequently discharged back to SNF.  As per report, wound on his left leg opened up during dressing change this morning at SNF and he was sent to the ED for evaluation.  Patient reports left lower extremity pain, swelling, dark bloody drainage.  He denies fever, chills.  He reports abdominal distention and he is not sure if this is due to constipation or fluid buildup.  He denies prior history of paracentesis.    Subjective:  Patient's main concern is pain.  Finds the Dilaudid  helpful but notes that it runs out too fast.  He did not know that he was allowed to have Dilaudid  more frequently than every 4 hours.  Notes that he is on oxycodone  every 6 PRN as an outpatient.  Also states that when he broke his shoulder he was on both fentanyl  and Dilaudid  and he would like to have this back.  States he was unable to participate in PT due to severe pain in his leg.   Objective: Vitals:   01/27/24 2352 01/28/24 0419 01/28/24 0714 01/28/24 1128  BP: 135/76 130/82 127/69 125/68  Pulse: 100  83 92  Resp: 20 19 16 16   Temp: 98.9 F (37.2 C) 98.7 F (37.1 C) 97.9 F (36.6 C) 98.9 F (37.2 C)  TempSrc: Oral Oral Oral Oral  SpO2: 97%  99% 98%  Weight:      Height:        Intake/Output Summary (Last 24 hours) at 01/28/2024 1422 Last data filed at 01/28/2024  1258 Gross per 24 hour  Intake 1020 ml  Output 1050 ml  Net -30 ml   Filed Weights   01/27/24 1239  Weight: (!) 140.6 kg     Exam:  General: obese man lying in bed in some discomfort holding himself still, alert and oriented  Eyes: sclera anicteric, conjuctiva mild injection bilaterally CVS: S1-S2, regular  Respiratory:  decreased air entry bilaterally secondary to decreased inspiratory effort, rales at bases  GI: NABS, soft, NT  LE: wound vac in place.  Neuro: A/O x 3,  grossly nonfocal.  Psych: patient is logical and coherent Pictures from previous notes:  Lower extremities Left leg has a large hematoma with a large dark scab on top, medial/posterior aspect of the hematoma has ruptured extruding some old clot.  No active fresh bleeding noted.  Details as in picture below from day of admission.  Neurovascular bundle intact. Right leg with hyperpigmentation             Data Reviewed:  Basic Metabolic Panel: Recent Labs  Lab 01/27/24 1256  NA 140  K 3.4*  CL 103  CO2 24  GLUCOSE 106*  BUN 14  CREATININE 0.82  CALCIUM 8.7*    CBC: Recent Labs  Lab 01/27/24 1256 01/28/24 0428  WBC 5.6 9.7  NEUTROABS 4.4  --   HGB 9.3* 8.0*  HCT 30.9* 25.4*  MCV 84.4 81.7  PLT 118*  148*     Scheduled Meds:  metoprolol  succinate  25 mg Oral Daily   pravastatin   20 mg Oral Daily   senna  1 tablet Oral BID   sertraline   100 mg Oral QHS   sodium chloride  flush  3 mL Intravenous Q12H   Continuous Infusions:   Assessment & Plan:   Chronic left leg hematoma Pain management S/p I&D and wound VAC placement by orthopedics Patient apparently may need multiple trips to the OR and eventual skin graft.  Will increase patient's oxycodone  to 10 mg every 6 as needed Sinew as needed Dilaudid  every 2 hours  H/o DVT/PE Okay to start Lovenox per orthopedics Pharmacy consultation requested  PAF HFpEF CAD HTN Continue Toprol -XL and statin Patient without chest  discomfort Follows Dr. Rolan as an outpatient  COPD OSA No evidence for acute flare  Cirrhosis noted on problem list Denies history of decompensated cirrhosis Outpatient follow-up  Disposition back to SNF when able    DVT prophylaxis: Lovenox Code Status: Full Family Communication: None today     Studies: No results found.  Principal Problem:   Leg hematoma Active Problems:   History of pulmonary embolism   Paroxysmal atrial fibrillation (HCC)   COPD (chronic obstructive pulmonary disease) (HCC)   Lymphedema   Essential hypertension   Mixed hyperlipidemia   Thrombocytopenia   Cirrhosis of liver without ascites (HCC)   Chronic heart failure with preserved ejection fraction (HCC)   Chronic venous stasis dermatitis of both lower extremities   Wound of left leg     Makenleigh Crownover Vangie Pike, [facility]  If 7PM-7AM, please contact night-coverage [email or web address]   LOS: 1 day

## 2024-01-28 NOTE — Evaluation (Signed)
 Physical Therapy Evaluation Patient Details Name: Colonel Krauser MRN: [id number] DOB: 1956-03-15 Today's Date: 01/28/2024  History of Present Illness  Pt is a 68 y.o. male admitted 01/27/24 for left leg pressure ulcer from hematoma, necrotic including subcutaneous tissue down to fascia. Pt s/p I&D and wound vac application 10/16. PMHx: paroxysmal A-fib, COPD, cirrhosis, DVT/PE in 2021, chronic diastolic CHF, HTN, OSA, GERD, HLD, low back pain, anxiety, CAD, and iron  deficiency anemia. Of note, recent hospitalization to [facility] d/t LLE hematoma with extravasation.   Clinical Impression  Pt admitted with above diagnosis. PTA, pt was receiving therapy services at a SNF. He reports modI for bed mobility and transfers using RW and supervision for gait using RW and stairs using handrails. Pt currently with functional limitations due to the deficits listed below (see PT Problem List). He required modA x2 for rolling in bed using rails. Pt adamantly declined to attempt further mobility d/t pain despite RN delivering IV pain medicine during session. Pt will benefit from acute skilled PT to increase his independence and safety with mobility to allow discharge. Recommend continued inpatient follow up therapy, <3 hours/day.     If plan is discharge home, recommend the following: Two people to help with walking and/or transfers;Two people to help with bathing/dressing/bathroom;Assistance with cooking/housework;Assist for transportation;Help with stairs or ramp for entrance   Can travel by private vehicle   No    Equipment Recommendations Hospital bed;Hoyer lift;Wheelchair (measurements PT);Wheelchair cushion (measurements PT)  Recommendations for Other Services       Functional Status Assessment Patient has had a recent decline in their functional status and demonstrates the ability to make significant improvements in function in a reasonable and predictable amount of time.     Precautions / Restrictions  Precautions Precautions: Fall Recall of Precautions/Restrictions: Impaired Precaution/Restrictions Comments: Wound Vac LLE Restrictions Weight Bearing Restrictions Per Provider Order: Yes LLE Weight Bearing Per Provider Order: Weight bearing as tolerated      Mobility  Bed Mobility Overal bed mobility: Needs Assistance Bed Mobility: Rolling Rolling: Mod assist, +2 for physical assistance, Used rails         General bed mobility comments: Pt rolled R/L in bed. Cues for sequencing and use of of rail. Pt pushing RLE into bed to aid in turning. Assist to pivot trunk/pelvis. Pt adamantly declined to attempt sitting EOB after IV pain medication.    Transfers                   General transfer comment: Pt adamantly declined to attempt transfer after IV pain medication    Ambulation/Gait                  Stairs            Wheelchair Mobility     Tilt Bed    Modified Rankin (Stroke Patients Only)       Balance Overall balance assessment: Needs assistance (Session remained bed level)                                           Pertinent Vitals/Pain Pain Assessment Pain Assessment: 0-10 Pain Score: 9  Pain Location: LLE Pain Descriptors / Indicators: Grimacing, Moaning, Discomfort Pain Intervention(s): Patient requesting pain meds-RN notified, RN gave pain meds during session, Limited activity within patient's tolerance, Monitored during session, Repositioned    Home Living Family/patient expects to be  discharged to:: Skilled nursing facility                   Additional Comments: sleeps in recliner    Prior Function Prior Level of Function : Needs assist         Mobility (physical): Gait;Stairs ADLs (physical): Bathing;Dressing;Toileting;IADLs Mobility Comments: Pt reports getting OOB on his own. Transfer via stand pivot using RW on his own. Reports he was working on ambulation and stairs with PT using AD/rails.  Sounds like PT was providing supervision, no hands on. 1 fall in July, resulting in hospitalization and sustaining RUE fx. ADLs Comments: Pt reports he could go to the [facility] on his own, but wasn't able to reach around and wipe himself yet. Needed asist for pericare. Pt states he was bathing sitting in a shower chair with staff assist to manage LB but could manage UB with set-up assist. Pt reports he could dress his UB but needed help with LB.     Extremity/Trunk Assessment   Upper Extremity Assessment Upper Extremity Assessment: Defer to OT evaluation RUE Deficits / Details: longstanding limited shoulder from prior injury RUE Coordination: decreased gross motor    Lower Extremity Assessment Lower Extremity Assessment: LLE deficits/detail LLE Deficits / Details: Pt POD 1 s/p LLE I&D. Decreased hip, knee, and ankle AROM. Pt in dressing from distal knee to ankle. Pt resistant to PROM attempts. Grossly 2/5 strength. LLE: Unable to fully assess due to pain LLE Sensation: decreased proprioception LLE Coordination: decreased gross motor    Cervical / Trunk Assessment Cervical / Trunk Assessment: Other exceptions Cervical / Trunk Exceptions: Increased Body Habitus  Communication   Communication Communication: No apparent difficulties    Cognition Arousal: Alert Behavior During Therapy: Flat affect   PT - Cognitive impairments: No family/caregiver present to determine baseline                       PT - Cognition Comments: Pt A,Ox4 Following commands: Intact       Cueing Cueing Techniques: Verbal cues     General Comments      Exercises     Assessment/Plan    PT Assessment Patient needs continued PT services  PT Problem List Decreased strength;Decreased range of motion;Decreased activity tolerance;Decreased balance;Decreased mobility;Decreased knowledge of use of DME;Decreased safety awareness;Pain;Decreased skin integrity       PT Treatment Interventions DME  instruction;Gait training;Functional mobility training;Therapeutic activities;Therapeutic exercise;Balance training;Patient/family education    PT Goals (Current goals can be found in the Care Plan section)  Acute Rehab PT Goals Patient Stated Goal: Regain independence and return Home PT Goal Formulation: With patient Time For Goal Achievement: 02/11/24 Potential to Achieve Goals: Good    Frequency Min 2X/week     Co-evaluation PT/OT/SLP Co-Evaluation/Treatment: Yes Reason for Co-Treatment: For patient/therapist safety PT goals addressed during session: Mobility/safety with mobility OT goals addressed during session: ADL's and self-care       AM-PAC PT 6 Clicks Mobility  Outcome Measure Help needed turning from your back to your side while in a flat bed without using bedrails?: Total Help needed moving from lying on your back to sitting on the side of a flat bed without using bedrails?: Total Help needed moving to and from a bed to a chair (including a wheelchair)?: Total Help needed standing up from a chair using your arms (e.g., wheelchair or bedside chair)?: Total Help needed to walk in hospital room?: Total Help needed climbing 3-5 steps with a  railing? : Total 6 Click Score: 6    End of Session   Activity Tolerance: Patient limited by pain Patient left: in bed;with call bell/phone within reach;with bed alarm set Nurse Communication: Mobility status;Need for lift equipment lorre) PT Visit Diagnosis: Pain;Other abnormalities of gait and mobility (R26.89);Unsteadiness on feet (R26.81);Difficulty in walking, not elsewhere classified (R26.2) Pain - Right/Left: Left Pain - part of body: Leg    Time: 9157-9089 PT Time Calculation (min) (ACUTE ONLY): 28 min   Charges:   PT Evaluation $PT Eval Moderate Complexity: 1 Mod   PT General Charges $$ ACUTE PT VISIT: 1 Visit         Randall SAUNDERS, PT, DPT Acute Rehabilitation Services Office: [phone] Secure Chat  Preferred  Delon CHRISTELLA Callander 01/28/2024, 11:33 AM

## 2024-01-29 DIAGNOSIS — S8012XA Contusion of left lower leg, initial encounter: Secondary | ICD-10-CM | POA: Diagnosis not present

## 2024-01-29 DIAGNOSIS — S81802A Unspecified open wound, left lower leg, initial encounter: Secondary | ICD-10-CM | POA: Diagnosis not present

## 2024-01-29 LAB — CBC
HCT: 23 % — ABNORMAL LOW (ref 39.0–52.0)
Hemoglobin: 6.9 g/dL — CL (ref 13.0–17.0)
MCH: 25.2 pg — ABNORMAL LOW (ref 26.0–34.0)
MCHC: 30 g/dL (ref 30.0–36.0)
MCV: 83.9 fL (ref 80.0–100.0)
Platelets: 133 K/uL — ABNORMAL LOW (ref 150–400)
RBC: 2.74 MIL/uL — ABNORMAL LOW (ref 4.22–5.81)
RDW: 16.6 % — ABNORMAL HIGH (ref 11.5–15.5)
WBC: 6.2 K/uL (ref 4.0–10.5)
nRBC: 0 % (ref 0.0–0.2)

## 2024-01-29 LAB — COMPREHENSIVE METABOLIC PANEL WITH GFR
ALT: 9 U/L (ref 0–44)
AST: 13 U/L — ABNORMAL LOW (ref 15–41)
Albumin: 2.5 g/dL — ABNORMAL LOW (ref 3.5–5.0)
Alkaline Phosphatase: 65 U/L (ref 38–126)
Anion gap: 10 (ref 5–15)
BUN: 19 mg/dL (ref 8–23)
CO2: 23 mmol/L (ref 22–32)
Calcium: 8.4 mg/dL — ABNORMAL LOW (ref 8.9–10.3)
Chloride: 106 mmol/L (ref 98–111)
Creatinine, Ser: 0.77 mg/dL (ref 0.61–1.24)
GFR, Estimated: >60 mL/min (ref >60)
Glucose, Bld: 95 mg/dL (ref 70–99)
Potassium: 4 mmol/L (ref 3.5–5.1)
Sodium: 139 mmol/L (ref 135–145)
Total Bilirubin: 0.4 mg/dL (ref 0.0–1.2)
Total Protein: 5.5 g/dL — ABNORMAL LOW (ref 6.5–8.1)

## 2024-01-29 LAB — PREPARE RBC (CROSSMATCH)

## 2024-01-29 LAB — HEMOGLOBIN AND HEMATOCRIT, BLOOD
HCT: 23.9 % — ABNORMAL LOW (ref 39.0–52.0)
Hemoglobin: 7.5 g/dL — ABNORMAL LOW (ref 13.0–17.0)

## 2024-01-29 MED ORDER — ENOXAPARIN SODIUM 150 MG/ML IJ SOSY
140.0000 mg | PREFILLED_SYRINGE | Freq: Two times a day (BID) | INTRAMUSCULAR | Status: DC
  Administered 2024-01-29: 140 mg via SUBCUTANEOUS
  Filled 2024-01-29: qty 0.94

## 2024-01-29 MED ORDER — LACTULOSE 10 GM/15ML PO SOLN: 20.0000 g | Freq: Two times a day (BID) | ORAL | Status: DC | PRN

## 2024-01-29 MED ORDER — ONDANSETRON HCL 4 MG/2ML IJ SOLN
4.0000 mg | Freq: Four times a day (QID) | INTRAMUSCULAR | Status: DC | PRN
  Administered 2024-01-29: 4 mg via INTRAVENOUS
  Filled 2024-01-29 (×2): qty 2

## 2024-01-29 MED ORDER — POLYETHYLENE GLYCOL 3350 17 G PO PACK
17.0000 g | PACK | Freq: Two times a day (BID) | ORAL | Status: DC
  Administered 2024-01-29 – 2024-02-01 (×5): 17 g via ORAL
  Filled 2024-01-29 (×11): qty 1

## 2024-01-29 MED ORDER — ENOXAPARIN SODIUM 150 MG/ML IJ SOSY
140.0000 mg | PREFILLED_SYRINGE | Freq: Two times a day (BID) | INTRAMUSCULAR | Status: DC
  Filled 2024-01-29: qty 0.94

## 2024-01-29 MED ORDER — LACTULOSE 10 GM/15ML PO SOLN
20.0000 g | ORAL | Status: AC
  Administered 2024-01-29: 20 g via ORAL
  Filled 2024-01-29: qty 30

## 2024-01-29 MED ORDER — SODIUM CHLORIDE 0.9% IV SOLUTION: Freq: Once | INTRAVENOUS | Status: AC

## 2024-01-29 NOTE — TOC Initial Note (Signed)
 Transition of Care [facility]) - Initial/Assessment Note    Patient Details  Name: Charles Gray MRN: [id number] Date of Birth: 1956/03/26  [facility] CM/SW Contact:    Lorraine LILLETTE Fenton, LCSW Phone Number: 01/29/2024, 2:23 PM  Clinical Narrative:                 CSW visited pt at bedside, he was alert watching a sporting event. Discussed DC plan and recommendations with pt. [facility] from [facility]  he states his plan is to return there to continue his rehabilitation then return home.   Pt high risk of readmission, CSW assessed further.  Pt states prior to SNF he had [facility] and had support services in place to assist with ADL's.  There was a CNA 3 hours a day 5 days a week, he also was enrolled in Mom's meals to supplement weekly meals.  Transportation was also included with the [facility] plan.  Pt lives alone, in [place] area- he has family, but they are busy with their needs and unable to assist him more.  Pt states a recent medical concern /bleeding has required more than typical medical visits.   He plans to complete rehab at [facility]- first choice, or a facility in the area of [place] then return home and try to select [facility] and resume support services. ICM following.  CSW called [facility]- no admission personnel on site today. Sent bed request for return via Hub,   Expected Discharge Plan: Skilled Nursing Facility (From [facility]) Barriers to Discharge: Continued Medical Work up   Patient Goals and CMS Choice Patient states their goals for this hospitalization and ongoing recovery are:: Patient wants to return to [facility] at DC to continue Rehab stay.          Expected Discharge Plan and Services In-house Referral: Clinical Social Work     Living arrangements for the past 2 months: Apartment                                      Prior Living Arrangements/Services Living arrangements for the past  2 months: Apartment Lives with:: Self   Do you feel safe going back to the place where you live?: Yes        Care giver support system in place?: No (comment) (Pt has family but they are all busy, offer little help.) Current home services: Homehealth aide (Had an aid prior to SNF would like to reapply for one.)    Activities of Daily Living      Permission Sought/Granted Permission sought to share information with : Case Manager Permission granted to share information with : Yes, Release of Information Signed              Emotional Assessment   Attitude/Demeanor/Rapport: Gracious Affect (typically observed): Calm Orientation: : Oriented to Self, Oriented to Place, Oriented to Situation, Oriented to  Time   Psych Involvement: No (comment)  Admission diagnosis:  Hematoma [T14.8XXA] Pain of left calf [M79.662] Leg hematoma [S80.10XA] Patient Active Problem List   Diagnosis Date Noted   Leg hematoma 01/27/2024   Wound of left leg 01/27/2024   Fall 10/25/2023   Closed 3-part fracture of proximal end of right humerus 10/25/2023   Leg laceration, right, initial encounter 10/25/2023   Acute on chronic heart failure with preserved ejection fraction (HFpEF) (HCC) 03/04/2022   Dyslipidemia  07/17/2021   Lymphedema 07/08/2021   Melena    Gastric polyps    UGIB (upper gastrointestinal bleed) 04/06/2021   Acute encephalopathy 12/21/2020   COPD (chronic obstructive pulmonary disease) (HCC) 12/21/2020   Chronic diastolic (congestive) heart failure (HCC) 11/14/2020   Syncope 10/28/2020   CHF (congestive heart failure) (HCC) 10/28/2020   Chronic venous stasis dermatitis of both lower extremities 08/27/2020   Cellulitis of both lower extremities 08/14/2020   Iron  deficiency anemia 08/14/2020   GERD without esophagitis 06/10/2020   Atrial fibrillation with rapid ventricular response (HCC) 03/24/2020   Acute on chronic right heart failure (HCC) 01/27/2020   Acute respiratory failure  with hypoxia (HCC)    Fatty liver disease, nonalcoholic    Shock (HCC)    Personal history of DVT (deep vein thrombosis) 12/26/2019   Chronic anticoagulation 12/26/2019   Palpitations 12/26/2019   Paroxysmal atrial fibrillation (HCC) 12/23/2019   Heart failure, systolic, acute (HCC) 08/24/2019   Acute on chronic congestive heart failure (HCC)    Acute on chronic diastolic CHF (congestive heart failure) (HCC) 08/23/2019   Chest pain 08/23/2019   Lactic acidosis 08/23/2019   History of pulmonary embolism 08/12/2019   Atrial tachycardia 08/12/2019   Occasional tremors 08/12/2019   OSA (obstructive sleep apnea) 08/12/2019   AKI (acute kidney injury) 08/08/2019   Acute on chronic right-sided heart failure (HCC) 08/06/2019   Acute pulmonary embolism (HCC) 06/08/2019   Normocytic anemia 06/08/2019   Pulmonary emboli (HCC) 06/08/2019   Chronic heart failure with preserved ejection fraction (HCC) 05/30/2019   Class 2 severe obesity due to excess calories with serious comorbidity and body mass index (BMI) of 39.0 to 39.9 in adult 05/30/2019   Morbid obesity with BMI of 40.0-44.9, adult (HCC) 05/30/2019   Hyponatremia    Drug induced constipation    Peripheral edema    Hypotension due to drugs    Hypoalbuminemia due to protein-calorie malnutrition    Thrombocytopenia    Anemia of chronic disease    Cirrhosis of liver without ascites (HCC)    Chronic pain syndrome    Debility 03/13/2019   Portal hypertensive gastropathy (HCC) 03/06/2019   Dyspnea    GIB (gastrointestinal bleeding) 03/04/2019   Mixed hyperlipidemia 01/24/2019   Insomnia 01/24/2019   Hyperlipidemia 01/24/2019   Essential hypertension 01/24/2019   Lumbar disc herniation 01/24/2019   PCP:  Sabas Norleen PARAS., MD Pharmacy:   [place] [facility] - [place], [place] - [address] [place] [address] Phone: [phone] Fax: [phone]     Social Drivers of Health (SDOH) Social History: SDOH Screenings    Food Insecurity: No Food Insecurity (01/28/2024)  Housing: Low Risk  (01/28/2024)  Transportation Needs: Unmet Transportation Needs (01/28/2024)  Utilities: Not At Risk (01/28/2024)  Alcohol Screen: Low Risk  (06/11/2020)  Depression (PHQ2-9): Medium Risk (06/11/2020)  Financial Resource Strain: Medium Risk (11/15/2020)  Social Connections: Socially Integrated (01/28/2024)  Tobacco Use: Low Risk  (01/27/2024)   SDOH Interventions:     Readmission Risk Interventions    01/29/2024    2:23 PM  Readmission Risk Prevention Plan  Transportation Screening Complete  PCP or Specialist Appt within 3-5 Days Complete  HRI or Home Care Consult Complete  Social Work Consult for Recovery Care Planning/Counseling Complete  Palliative Care Screening Not Applicable  Medication Review Oceanographer) Complete

## 2024-01-29 NOTE — Plan of Care (Signed)
   Problem: Education: Goal: Knowledge of General Education information will improve Description Including pain rating scale, medication(s)/side effects and non-pharmacologic comfort measures Outcome: Progressing

## 2024-01-29 NOTE — Consult Note (Signed)
 ORTHOPAEDIC CONSULTATION  REQUESTING PHYSICIAN: [name]  Chief Complaint: Left leg necrotic ulceration  HPI: Charles Gray is a 68 y.o. male who presents with necrotic skin soft tissue and muscle left lower extremity secondary to a hematoma.  Patient is status post debridement with Dr. Genelle on October 16.  Past Medical History:  Diagnosis Date   Acute on chronic congestive heart failure (HCC)    Acute on chronic diastolic CHF (congestive heart failure) (HCC) 08/23/2019   Acute on chronic right-sided heart failure (HCC) 08/06/2019   Acute respiratory failure with hypoxia (HCC)    Anemia of chronic disease    Atrial fibrillation (HCC) 12/23/2019   Atrial tachycardia 08/12/2019   Benign essential HTN    Chest pain 08/23/2019   Chronic anticoagulation 12/26/2019   Chronic heart failure with preserved EF 05/30/2019   Echo 07/2019: EF 55-60, GR 1 DD   Chronic pain syndrome    Cirrhosis of liver without ascites (HCC)    Class 2 severe obesity due to excess calories with serious comorbidity and body mass index (BMI) of 39.0 to 39.9 in adult 05/30/2019   Debility 03/13/2019   Drug induced constipation    Dyspnea    Fatty liver disease, nonalcoholic    GIB (gastrointestinal bleeding) 03/04/2019   Heart failure, systolic, acute (HCC) 08/24/2019   History of pulmonary embolism 08/12/2019   Hyperlipidemia 01/24/2019   Hypertension 01/24/2019   Hypoalbuminemia due to protein-calorie malnutrition    Hypokalemia    Hyponatremia    Hypotension due to drugs    Insomnia 01/24/2019   Lactic acidosis 08/23/2019   Lumbar disc herniation 01/24/2019   Mixed hyperlipidemia 01/24/2019   Morbid obesity with BMI of 40.0-44.9, adult (HCC) 05/30/2019   Normocytic anemia 06/08/2019   Occasional tremors 08/12/2019   OSA (obstructive sleep apnea) 08/12/2019   Palpitations 12/26/2019   Peripheral edema    Personal history of DVT (deep vein thrombosis) 12/26/2019   Portal hypertensive  gastropathy (HCC) 03/06/2019   Seen on EGD 03/06/2019   Pulmonary emboli (HCC) 06/08/2019   Pulmonary embolism (HCC) 06/08/2019   Redness and swelling of lower leg 08/23/2019   Shock (HCC)    Thrombocytopenia    Past Surgical History:  Procedure Laterality Date   APPLICATION OF WOUND VAC Left 01/27/2024   Procedure: APPLICATION, WOUND VAC;  Surgeon: Genelle Standing, MD;  Location: [facility] OR;  Service: Orthopedics;  Laterality: Left;   BIOPSY  04/08/2021   Procedure: BIOPSY;  Surgeon: Aneita Gwendlyn DASEN, MD;  Location: [facility] ENDOSCOPY;  Service: Endoscopy;;   ESOPHAGOGASTRODUODENOSCOPY N/A 04/08/2021   Procedure: ESOPHAGOGASTRODUODENOSCOPY (EGD);  Surgeon: Aneita Gwendlyn DASEN, MD;  Location: [facility] ENDOSCOPY;  Service: Endoscopy;  Laterality: N/A;   ESOPHAGOGASTRODUODENOSCOPY (EGD) WITH PROPOFOL  N/A 03/05/2019   Procedure: ESOPHAGOGASTRODUODENOSCOPY (EGD) WITH PROPOFOL ;  Surgeon: Legrand Victory LITTIE DOUGLAS, MD;  Location: [facility] ENDOSCOPY;  Service: Gastroenterology;  Laterality: N/A;  Large Blood Clot Removed   ESOPHAGOGASTRODUODENOSCOPY (EGD) WITH PROPOFOL  N/A 03/06/2019   Procedure: ESOPHAGOGASTRODUODENOSCOPY (EGD) WITH PROPOFOL ;  Surgeon: Eda Iha, MD;  Location: [facility] ENDOSCOPY;  Service: Gastroenterology;  Laterality: N/A;   HEMORROIDECTOMY     INCISION AND DRAINAGE OF WOUND Left 01/27/2024   Procedure: IRRIGATION AND DEBRIDEMENT WOUND;  Surgeon: Genelle Standing, MD;  Location: [facility] OR;  Service: Orthopedics;  Laterality: Left;   PRESSURE SENSOR/CARDIOMEMS N/A 11/14/2020   Procedure: PRESSURE SENSOR/CARDIOMEMS;  Surgeon: Rolan Ezra RAMAN, MD;  Location: [facility] INVASIVE CV LAB;  Service: Cardiovascular;  Laterality: N/A;   RIGHT HEART CATH  N/A 08/09/2019   Procedure: RIGHT HEART CATH;  Surgeon: Rolan Ezra RAMAN, MD;  Location: [facility] INVASIVE CV LAB;  Service: Cardiovascular;  Laterality: N/A;   RIGHT HEART CATH N/A 11/01/2020   Procedure: RIGHT HEART CATH;  Surgeon: Rolan Ezra RAMAN, MD;  Location: [facility] INVASIVE CV LAB;   Service: Cardiovascular;  Laterality: N/A;   RIGHT HEART CATH N/A 11/14/2020   Procedure: RIGHT HEART CATH;  Surgeon: Rolan Ezra RAMAN, MD;  Location: [facility] INVASIVE CV LAB;  Service: Cardiovascular;  Laterality: N/A;   RIGHT/LEFT HEART CATH AND CORONARY ANGIOGRAPHY N/A 05/10/2020   Procedure: RIGHT/LEFT HEART CATH AND CORONARY ANGIOGRAPHY;  Surgeon: Rolan Ezra RAMAN, MD;  Location: [facility] INVASIVE CV LAB;  Service: Cardiovascular;  Laterality: N/A;   Social History   Socioeconomic History   Marital status: Single    Spouse name: Not on file   Number of children: Not on file   Years of education: Not on file   Highest education level: Not on file  Occupational History   Not on file  Tobacco Use   Smoking status: Never   Smokeless tobacco: Never  Vaping Use   Vaping status: Never Used  Substance and Sexual Activity   Alcohol use: Not Currently   Drug use: Not Currently    Types: Marijuana    Comment: Smoked for 30 years   Sexual activity: Not on file  Other Topics Concern   Not on file  Social History Narrative   Not on file   Social Drivers of Health   Financial Resource Strain: Medium Risk (11/15/2020)   Overall Financial Resource Strain (CARDIA)    Difficulty of Paying Living Expenses: Somewhat hard  Food Insecurity: No Food Insecurity (01/28/2024)   Hunger Vital Sign    Worried About Running Out of Food in the Last Year: Never true    Ran Out of Food in the Last Year: Never true  Transportation Needs: Unmet Transportation Needs (01/28/2024)   PRAPARE - Administrator, Civil Service (Medical): Yes    Lack of Transportation (Non-Medical): Yes  Physical Activity: Not on file  Stress: Not on file  Social Connections: Socially Integrated (01/28/2024)   Social Connection and Isolation Panel    Frequency of Communication with Friends and Family: More than three times a week    Frequency of Social Gatherings with Friends and Family: Three times a week    Attends Religious  Services: More than 4 times per year    Active Member of Clubs or Organizations: No    Attends Engineer, structural: More than 4 times per year    Marital Status: Married   Family History  Problem Relation Age of Onset   Hyperlipidemia Mother    Hypertension Mother    Stroke Mother    CAD Maternal Grandmother    CAD Maternal Grandfather    - negative except otherwise stated in the family history section Allergies  Allergen Reactions   Penicillins Other (See Comments)    Childhood reaction - convulsions    Prior to Admission medications   Medication Sig Start Date End Date Taking? Authorizing Provider  acetaminophen  (TYLENOL ) 500 MG tablet Take 1,000 mg by mouth every 6 (six) hours. x6 days (01/21/24-01/27/24)   Yes [provider]  ascorbic acid (VITAMIN C) 500 MG tablet Take 500 mg by mouth daily.   Yes [provider]  ferrous sulfate  325 (65 FE) MG tablet Take 1 tablet (325 mg total) by mouth daily with breakfast.  11/05/23  Yes Tobie Yetta HERO, MD  folic acid (FOLVITE) 1 MG tablet Take 1 mg by mouth daily.   Yes [provider]  gabapentin  (NEURONTIN ) 800 MG tablet Take 1 tablet (800 mg total) by mouth 3 (three) times daily. 11/04/23  Yes Tobie Yetta HERO, MD  lactulose , encephalopathy, (CHRONULAC ) 10 GM/15ML SOLN Take 30 mLs (20 g total) by mouth 2 (two) times daily. Patient taking differently: Take 20 g by mouth daily as needed (constipation). 03/11/22  Yes Fairy Frames, MD  magnesium  oxide (MAG-OX) 400 (240 Mg) MG tablet Take 400 mg by mouth daily.   Yes [provider]  methocarbamol  (ROBAXIN ) 500 MG tablet Take 1 tablet (500 mg total) by mouth 3 (three) times daily. 11/04/23  Yes Tobie Yetta HERO, MD  methocarbamol  (ROBAXIN ) 750 MG tablet Take 750 mg by mouth 3 (three) times daily.   Yes [provider]  metoprolol  succinate (TOPROL -XL) 25 MG 24 hr tablet TAKE ONE TABLET BY MOUTH AT BEDTIME Patient taking differently: Take 25  mg by mouth daily. 03/23/23  Yes Rolan Ezra RAMAN, MD  naloxone  (NARCAN ) nasal spray 4 mg/0.1 mL Place 0.4 mg into the nose See admin instructions. Administer 1 spray (0.4mg ), alternating nostrils ever 65 minutes as needed for unresponsiveness. Call MD and EMS if given.   Yes [provider]  omeprazole (PRILOSEC) 20 MG capsule Take 20 mg by mouth daily before breakfast.   Yes [provider]  ondansetron  (ZOFRAN ) 8 MG tablet Take 8 mg by mouth every 8 (eight) hours as needed for nausea or vomiting.   Yes [provider]  oxyCODONE  (ROXICODONE ) 15 MG immediate release tablet Take 1 tablet (15 mg total) by mouth every 4 (four) hours as needed for severe pain (pain score 7-10) or moderate pain (pain score 4-6). 11/04/23  Yes Tobie Yetta HERO, MD  polyethylene glycol powder (GLYCOLAX /MIRALAX ) 17 GM/SCOOP powder Take 17 g by mouth daily as needed (constipation).   Yes [provider]  potassium chloride  SA (KLOR-CON  M) 20 MEQ tablet Take 20 mEq by mouth 3 (three) times daily.   Yes [provider]  pravastatin  (PRAVACHOL ) 20 MG tablet Take 1 tablet by mouth daily. Patient taking differently: Take 20 mg by mouth daily at 6 PM. 09/16/23  Yes Rolan Ezra RAMAN, MD  sennosides-docusate sodium  (SENOKOT-S) 8.6-50 MG tablet Take 2 tablets by mouth in the morning and at bedtime.   Yes [provider]  sertraline  (ZOLOFT ) 100 MG tablet Take 100 mg by mouth daily.   Yes [provider]  tamsulosin  (FLOMAX ) 0.4 MG CAPS capsule Take 1 capsule (0.4 mg total) by mouth daily. Additional refills will need to come from PCP Patient taking differently: Take 0.4 mg by mouth daily at 6 PM. 04/01/22  Yes McLean, Dalton S, MD  torsemide  (DEMADEX ) 20 MG tablet Take 40 mg by mouth daily.   Yes [provider]  VENTOLIN  HFA 108 (90 Base) MCG/ACT inhaler Inhale 1 puff into the lungs every 4 (four) hours as needed for shortness of breath. Patient taking differently:  Inhale 2 puffs into the lungs every 6 (six) hours as needed for wheezing. 03/23/19  Yes Angiulli, Toribio PARAS, PA-C  zolpidem  (AMBIEN ) 10 MG tablet Take 1 tablet (10 mg total) by mouth at bedtime as needed for sleep. 11/04/23  Yes Tobie Yetta HERO, MD  apixaban  (ELIQUIS ) 5 MG TABS tablet Take 1 tablet (5 mg total) by mouth 2 (two) times daily. Patient not taking: Reported on 01/28/2024  11/19/21   Bensimhon, Toribio SAUNDERS, MD  bumetanide  (BUMEX ) 1 MG tablet Take 4 tablets (4 mg total) by mouth daily with breakfast AND 3 tablets (3 mg total) every evening. 05/10/20 06/13/20  Rolan Ezra RAMAN, MD  metoprolol  tartrate (LOPRESSOR ) 50 MG tablet Take 1 tablet (50 mg total) by mouth 2 (two) times daily. 02/09/20 04/11/20  Gonfa, Taye T, MD   No results found. - pertinent xrays, CT, MRI studies were reviewed and independently interpreted  Positive ROS: All other systems have been reviewed and were otherwise negative with the exception of those mentioned in the HPI and as above.  Physical Exam: General: Alert, no acute distress Psychiatric: Patient is competent for consent with normal mood and affect Lymphatic: No axillary or cervical lymphadenopathy Cardiovascular: No pedal edema Respiratory: No cyanosis, no use of accessory musculature GI: No organomegaly, abdomen is soft and non-tender    Images:  @ENCIMAGES @  Labs:  Lab Results  Component Value Date   HGBA1C 5.2 10/27/2023   HGBA1C 5.9 (H) 07/10/2021   HGBA1C 5.2 01/28/2020   CRP 1.0 (H) 08/23/2019   LABURIC 7.9 03/10/2022   REPTSTATUS 07/12/2021 FINAL 07/07/2021   CULT  07/07/2021    NO GROWTH 5 DAYS Performed at [facility], [address], [place], [place] [address]    LABORGA STAPHYLOCOCCUS EPIDERMIDIS (A) 07/07/2021   LABORGA ENTEROCOCCUS FAECALIS (A) 07/07/2021    Lab Results  Component Value Date   ALBUMIN  2.5 (L) 01/29/2024   ALBUMIN  2.9 (L) 01/27/2024   ALBUMIN  3.2 (L) 10/24/2023   LABURIC 7.9 03/10/2022        Latest  Ref Rng & Units 01/29/2024    9:50 AM 01/28/2024    4:28 AM 01/27/2024   12:56 PM  CBC EXTENDED  WBC 4.0 - 10.5 K/uL 6.2  9.7  5.6   RBC 4.22 - 5.81 MIL/uL 2.74  3.11  3.66   Hemoglobin 13.0 - 17.0 g/dL 6.9  8.0  9.3   HCT 60.9 - 52.0 % 23.0  25.4  30.9   Platelets 150 - 400 K/uL 133  148  118   NEUT# 1.7 - 7.7 K/uL   4.4   Lymph# 0.7 - 4.0 K/uL   0.6     Neurologic: Patient does not have protective sensation bilateral lower extremities.   MUSCULOSKELETAL:   Skin: Examination the wound VAC is in place.  Patient has 350 cc in the wound VAC canister.  Hemoglobin 6.9 this morning with a white cell count of 6.2.  Patient receiving a transfusion.  Nasal swab negative.  Tissue cultures not available. Assessment: Assessment: Necrotic skin and soft tissue left leg secondary to a hematoma.  Plan: Plan: Will plan for debridement left leg with application Kerecis micro graft and a wound VAC.  Patient's hemoglobin will need to be greater than 9 for surgery on Wednesday.  Thank you for the consult and the opportunity to see Mr. Merrick Maggio, MD [facility] [phone] 3:34 PM

## 2024-01-29 NOTE — Plan of Care (Signed)

## 2024-01-29 NOTE — NC FL2 (Signed)
 [place]  MEDICAID FL2 LEVEL OF CARE FORM     IDENTIFICATION  Patient Name: Charles Gray Birthdate: 1955/12/28 Sex: male Admission Date (Current Location): 01/27/2024  [place] and [place] Number:  [place] [id number] Facility and Address:  [facility], [address], [place], [place] [address]      Provider Number:    Attending Physician Name and Address:  [name]  Relative Name and Phone Number:  Roey, Coopman (Son)  [phone] [facility])    Current Level of Care: Hospital Recommended Level of Care: Skilled Nursing Facility Prior Approval Number:    Date Approved/Denied:   PASRR Number:    Discharge Plan: SNF    Current Diagnoses: Patient Active Problem List   Diagnosis Date Noted   Leg hematoma 01/27/2024   Wound of left leg 01/27/2024   Fall 10/25/2023   Closed 3-part fracture of proximal end of right humerus 10/25/2023   Leg laceration, right, initial encounter 10/25/2023   Acute on chronic heart failure with preserved ejection fraction (HFpEF) (HCC) 03/04/2022   Dyslipidemia 07/17/2021   Lymphedema 07/08/2021   Melena    Gastric polyps    UGIB (upper gastrointestinal bleed) 04/06/2021   Acute encephalopathy 12/21/2020   COPD (chronic obstructive pulmonary disease) (HCC) 12/21/2020   Chronic diastolic (congestive) heart failure (HCC) 11/14/2020   Syncope 10/28/2020   CHF (congestive heart failure) (HCC) 10/28/2020   Chronic venous stasis dermatitis of both lower extremities 08/27/2020   Cellulitis of both lower extremities 08/14/2020   Iron  deficiency anemia 08/14/2020   GERD without esophagitis 06/10/2020   Atrial fibrillation with rapid ventricular response (HCC) 03/24/2020   Acute on chronic right heart failure (HCC) 01/27/2020   Acute respiratory failure with hypoxia (HCC)    Fatty liver disease, nonalcoholic    Shock (HCC)    Personal history of DVT (deep vein thrombosis) 12/26/2019   Chronic  anticoagulation 12/26/2019   Palpitations 12/26/2019   Paroxysmal atrial fibrillation (HCC) 12/23/2019   Heart failure, systolic, acute (HCC) 08/24/2019   Acute on chronic congestive heart failure (HCC)    Acute on chronic diastolic CHF (congestive heart failure) (HCC) 08/23/2019   Chest pain 08/23/2019   Lactic acidosis 08/23/2019   History of pulmonary embolism 08/12/2019   Atrial tachycardia 08/12/2019   Occasional tremors 08/12/2019   OSA (obstructive sleep apnea) 08/12/2019   AKI (acute kidney injury) 08/08/2019   Acute on chronic right-sided heart failure (HCC) 08/06/2019   Acute pulmonary embolism (HCC) 06/08/2019   Normocytic anemia 06/08/2019   Pulmonary emboli (HCC) 06/08/2019   Chronic heart failure with preserved ejection fraction (HCC) 05/30/2019   Class 2 severe obesity due to excess calories with serious comorbidity and body mass index (BMI) of 39.0 to 39.9 in adult 05/30/2019   Morbid obesity with BMI of 40.0-44.9, adult (HCC) 05/30/2019   Hyponatremia    Drug induced constipation    Peripheral edema    Hypotension due to drugs    Hypoalbuminemia due to protein-calorie malnutrition    Thrombocytopenia    Anemia of chronic disease    Cirrhosis of liver without ascites (HCC)    Chronic pain syndrome    Debility 03/13/2019   Portal hypertensive gastropathy (HCC) 03/06/2019   Dyspnea    GIB (gastrointestinal bleeding) 03/04/2019   Mixed hyperlipidemia 01/24/2019   Insomnia 01/24/2019   Hyperlipidemia 01/24/2019   Essential hypertension 01/24/2019   Lumbar disc herniation 01/24/2019    Orientation RESPIRATION BLADDER Height & Weight     Self, Time,  Situation, Place  Normal Continent Weight: (!) 310 lb (140.6 kg) Height:  6' 10 (208.3 cm)  BEHAVIORAL SYMPTOMS/MOOD NEUROLOGICAL BOWEL NUTRITION STATUS      Continent Diet  AMBULATORY STATUS COMMUNICATION OF NEEDS Skin   Limited Assist Verbally Normal, Skin abrasions                       Personal Care  Assistance Level of Assistance  Bathing, Feeding, Dressing Bathing Assistance: Limited assistance Feeding assistance: Limited assistance Dressing Assistance: Maximum assistance     Functional Limitations Info             SPECIAL CARE FACTORS FREQUENCY                       Contractures Contractures Info: Not present    Additional Factors Info  Code Status, Allergies Code Status Info: Full Allergies Info: Penicillins           Current Medications (01/29/2024):  This is the current hospital active medication list Current Facility-Administered Medications  Medication Dose Route Frequency Provider Last Rate Last Admin   acetaminophen  (TYLENOL ) tablet 650 mg  650 mg Oral Q6H PRN Hongalgi, Anand D, MD       Or   acetaminophen  (TYLENOL ) suppository 650 mg  650 mg Rectal Q6H PRN Hongalgi, Anand D, MD       albuterol  (PROVENTIL ) (2.5 MG/3ML) 0.083% nebulizer solution 2.5 mg  2.5 mg Nebulization Q4H PRN Hongalgi, Anand D, MD       HYDROmorphone  (DILAUDID ) injection 2 mg  2 mg Intravenous Q2H PRN Genelle Standing, MD   2 mg at 01/29/24 9087   metoprolol  succinate (TOPROL -XL) 24 hr tablet 25 mg  25 mg Oral Daily Chavez, Abigail, NP   25 mg at 01/29/24 9087   oxyCODONE  (Oxy IR/ROXICODONE ) immediate release tablet 15 mg  15 mg Oral Q4H PRN Chatterjee, Srobona Tublu, MD   15 mg at 01/29/24 1430   polyethylene glycol (MIRALAX  / GLYCOLAX ) packet 17 g  17 g Oral Daily PRN Hongalgi, Anand D, MD   17 g at 01/29/24 1214   pravastatin  (PRAVACHOL ) tablet 20 mg  20 mg Oral Daily Chavez, Abigail, NP   20 mg at 01/29/24 0911   senna (SENOKOT) tablet 8.6 mg  1 tablet Oral BID Hongalgi, Anand D, MD   8.6 mg at 01/28/24 2128   sertraline  (ZOLOFT ) tablet 100 mg  100 mg Oral QHS Chavez, Abigail, NP   100 mg at 01/28/24 2128   sodium chloride  flush (NS) 0.9 % injection 3 mL  3 mL Intravenous Q12H Hongalgi, Anand D, MD   3 mL at 01/29/24 1003   zolpidem  (AMBIEN ) tablet 5 mg  5 mg Oral QHS PRN Chavez,  Abigail, NP   5 mg at 01/28/24 2358     Discharge Medications: Please see discharge summary for a list of discharge medications.  Relevant Imaging Results:  Relevant Lab Results:   Additional Information [id number]  Lorraine LILLETTE Fenton, LCSW

## 2024-01-29 NOTE — Progress Notes (Addendum)
 PHARMACY - ANTICOAGULATION CONSULT NOTE  Pharmacy Consult for enoxaparin Indication:  Afib with h/o DVT/PE  Allergies  Allergen Reactions   Penicillins Other (See Comments)    Childhood reaction - convulsions     Patient Measurements: Height: 6' 10 (208.3 cm) Weight: (!) 140.6 kg (310 lb) IBW/kg (Calculated) : 100.6 HEPARIN  DW (KG): 130.2  Vital Signs: Temp: 97.8 F (36.6 C) (10/18 0806) Temp Source: Oral (10/18 0806) BP: 116/66 (10/18 0806) Pulse Rate: 64 (10/18 0806)  Labs: Recent Labs    01/27/24 1256 01/28/24 0428  HGB 9.3* 8.0*  HCT 30.9* 25.4*  PLT 118* 148*  LABPROT 13.9  --   INR 1.0  --   CREATININE 0.82  --     Estimated Creatinine Clearance: 142.2 mL/min (by C-G formula based on SCr of 0.82 mg/dL).   Medical History: Past Medical History:  Diagnosis Date   Acute on chronic congestive heart failure (HCC)    Acute on chronic diastolic CHF (congestive heart failure) (HCC) 08/23/2019   Acute on chronic right-sided heart failure (HCC) 08/06/2019   Acute respiratory failure with hypoxia (HCC)    Anemia of chronic disease    Atrial fibrillation (HCC) 12/23/2019   Atrial tachycardia 08/12/2019   Benign essential HTN    Chest pain 08/23/2019   Chronic anticoagulation 12/26/2019   Chronic heart failure with preserved EF 05/30/2019   Echo 07/2019: EF 55-60, GR 1 DD   Chronic pain syndrome    Cirrhosis of liver without ascites (HCC)    Class 2 severe obesity due to excess calories with serious comorbidity and body mass index (BMI) of 39.0 to 39.9 in adult 05/30/2019   Debility 03/13/2019   Drug induced constipation    Dyspnea    Fatty liver disease, nonalcoholic    GIB (gastrointestinal bleeding) 03/04/2019   Heart failure, systolic, acute (HCC) 08/24/2019   History of pulmonary embolism 08/12/2019   Hyperlipidemia 01/24/2019   Hypertension 01/24/2019   Hypoalbuminemia due to protein-calorie malnutrition    Hypokalemia    Hyponatremia    Hypotension due to  drugs    Insomnia 01/24/2019   Lactic acidosis 08/23/2019   Lumbar disc herniation 01/24/2019   Mixed hyperlipidemia 01/24/2019   Morbid obesity with BMI of 40.0-44.9, adult (HCC) 05/30/2019   Normocytic anemia 06/08/2019   Occasional tremors 08/12/2019   OSA (obstructive sleep apnea) 08/12/2019   Palpitations 12/26/2019   Peripheral edema    Personal history of DVT (deep vein thrombosis) 12/26/2019   Portal hypertensive gastropathy (HCC) 03/06/2019   Seen on EGD 03/06/2019   Pulmonary emboli (HCC) 06/08/2019   Pulmonary embolism (HCC) 06/08/2019   Redness and swelling of lower leg 08/23/2019   Shock (HCC)    Thrombocytopenia      Assessment: 68 yo M with hx afib and VTE with PE 2021. Admitted with recurrent hematoma even after stoping eliquis , now s/p evacuation 10/16. Ortho ok to start enoxaparin. Last dose apixaban  01/17/24 21:00. Pharmacy consulted for enoxaparin.  I discussed indication and enoxaparin dosing with Dr. Dana and confirms to give full dose anticoagulation for Afib, 1mg /kg q12hr.   CrCl 142 ml/min. BMI 32.  Goal of Therapy:  Monitor platelets by anticoagulation protocol: Yes   Plan:  Enoxaparin 140mg  q12hr Monitor signs/symptoms of bleeding   Levorn Gaskins, RPh Clinical Pharmacist 01/29/2024 9:21 AM   Please check AMION for all [facility] Pharmacy phone numbers After 10:00 PM, call Main Pharmacy [phone]  ADDENDUM:   Hgb dropped to 6.9, thus Dr. Dana has  discontinued th Lovenox.   Levorn Gaskins, RPh Clinical Pharmacist 01/29/2024 11:03 AM

## 2024-01-29 NOTE — Progress Notes (Signed)
 PROGRESS NOTE    Charles Gray  FMW:[id number]  DOB: December 17, 1955  DOA: 01/27/2024 PCP: Sabas Norleen PARAS., MD Outpatient Specialists:   Hospital course:  Charles Gray is a 68 year old male, current resident of SNF, PMH of paroxysmal A-fib, COPD, cirrhosis, DVT/PE in 2021, chronic diastolic CHF, HTN, OSA, HLD, low back pain, anxiety, CAD, iron  deficiency anemia, recent hospitalization to [facility] due to spontaneous left lower extremity hematoma, active extravasation on CTA, IR and general surgery were consulted, it does not appear that he underwent any specific interventions, anticoagulation was reversed, anticoagulants were held, subsequently discharged back to SNF.  As per report, wound on his left leg opened up during dressing change this morning at SNF and he was sent to the ED for evaluation.  Patient reports left lower extremity pain, swelling, dark bloody drainage.  He denies fever, chills.  He reports abdominal distention and he is not sure if this is due to constipation or fluid buildup.  He denies prior history of paracentesis.    Subjective:  Patient states his pain is better controlled today after medication changes.  Notes he still has pain but it is okay except for when the wound VAC is suctioning intermittently.  Patient complaining of constipation, does not remember the last time he had a bowel movement.   Objective: Vitals:   01/29/24 1212 01/29/24 1215 01/29/24 1425 01/29/24 2035  BP: (!) 103/59 (!) 103/59 123/71 122/73  Pulse: 64 64 67 64  Resp: 16 16 16 13   Temp: 98.5 F (36.9 C) 98.5 F (36.9 C) 98.3 F (36.8 C) 98.9 F (37.2 C)  TempSrc:  Oral Oral   SpO2:  99% 100% 100%  Weight:      Height:        Intake/Output Summary (Last 24 hours) at 01/29/2024 2100 Last data filed at 01/29/2024 1908 Gross per 24 hour  Intake 292 ml  Output 1350 ml  Net -1058 ml   Filed Weights   01/27/24 1239  Weight: (!) 140.6 kg     Exam:  General: obese man  lying in bed looking much more comfortable chatting with family members at bedside Eyes: sclera anicteric, conjuctiva mild injection bilaterally CVS: S1-S2, regular  Respiratory:  decreased air entry bilaterally secondary to decreased inspiratory effort, rales at bases  GI: NABS, soft, NT  LE: wound vac in place.  Neuro: A/O x 3,  grossly nonfocal.  Psych: patient is logical and coherent      Data Reviewed:  Basic Metabolic Panel: Recent Labs  Lab 01/27/24 1256 01/29/24 0950  NA 140 139  K 3.4* 4.0  CL 103 106  CO2 24 23  GLUCOSE 106* 95  BUN 14 19  CREATININE 0.82 0.77  CALCIUM 8.7* 8.4*    CBC: Recent Labs  Lab 01/27/24 1256 01/28/24 0428 01/29/24 0950 01/29/24 1557  WBC 5.6 9.7 6.2  --   NEUTROABS 4.4  --   --   --   HGB 9.3* 8.0* 6.9* 7.5*  HCT 30.9* 25.4* 23.0* 23.9*  MCV 84.4 81.7 83.9  --   PLT 118* 148* 133*  --      Scheduled Meds:  metoprolol  succinate  25 mg Oral Daily   polyethylene glycol  17 g Oral BID   pravastatin   20 mg Oral Daily   senna  1 tablet Oral BID   sertraline   100 mg Oral QHS   sodium chloride  flush  3 mL Intravenous Q12H   Continuous Infusions:   Assessment & Plan:  ABLA Hemoglobin decreased down to 6.9 today Patient is hemodynamically stable He did get 1 dose of Lovenox yesterday, will discontinue again. Orthopedics notes that he did not have much blood loss during debridement but is oozing so I was not surprised with decrease in H&H. Will transfuse 1 unit PRBC with posttransfusion CBC ordered  Constipation Patient requesting lactulose , will order Would consider enema and/or suppository if lactulose  not effective   Chronic left leg hematoma Pain management S/p I&D and wound VAC placement by orthopedics Patient apparently may need multiple trips to the OR and eventual skin graft.  Will increase patient's oxycodone  to 10 mg every 6 as needed Sinew as needed Dilaudid  every 2 hours  Anticoagulation for history of  DVT/PE 2021 and atrial fibrillation Lovenox started yesterday, discontinued today given drop in H&H Can re-request pharmacy consultation once H&H are stable  PAF HFpEF CAD HTN Continue Toprol -XL and statin Patient without chest discomfort Follows Dr. Rolan as an outpatient  COPD OSA No evidence for acute flare  Cirrhosis noted on problem list Denies history of decompensated cirrhosis Outpatient follow-up  Disposition back to SNF when able    DVT prophylaxis: Lovenox Code Status: Full Family Communication: None today     Studies: No results found.  Principal Problem:   Leg hematoma Active Problems:   History of pulmonary embolism   Paroxysmal atrial fibrillation (HCC)   COPD (chronic obstructive pulmonary disease) (HCC)   Lymphedema   Essential hypertension   Mixed hyperlipidemia   Thrombocytopenia   Cirrhosis of liver without ascites (HCC)   Chronic heart failure with preserved ejection fraction (HCC)   Chronic venous stasis dermatitis of both lower extremities   Wound of left leg     Brihany Butch Vangie Pike, [facility]  If 7PM-7AM, please contact night-coverage [email or web address]   LOS: 2 days

## 2024-01-29 NOTE — H&P (View-Only) (Signed)
 ORTHOPAEDIC CONSULTATION  REQUESTING PHYSICIAN: [name]  Chief Complaint: Left leg necrotic ulceration  HPI: Charles Gray is a 68 y.o. male who presents with necrotic skin soft tissue and muscle left lower extremity secondary to a hematoma.  Patient is status post debridement with Dr. Genelle on October 16.  Past Medical History:  Diagnosis Date   Acute on chronic congestive heart failure (HCC)    Acute on chronic diastolic CHF (congestive heart failure) (HCC) 08/23/2019   Acute on chronic right-sided heart failure (HCC) 08/06/2019   Acute respiratory failure with hypoxia (HCC)    Anemia of chronic disease    Atrial fibrillation (HCC) 12/23/2019   Atrial tachycardia 08/12/2019   Benign essential HTN    Chest pain 08/23/2019   Chronic anticoagulation 12/26/2019   Chronic heart failure with preserved EF 05/30/2019   Echo 07/2019: EF 55-60, GR 1 DD   Chronic pain syndrome    Cirrhosis of liver without ascites (HCC)    Class 2 severe obesity due to excess calories with serious comorbidity and body mass index (BMI) of 39.0 to 39.9 in adult 05/30/2019   Debility 03/13/2019   Drug induced constipation    Dyspnea    Fatty liver disease, nonalcoholic    GIB (gastrointestinal bleeding) 03/04/2019   Heart failure, systolic, acute (HCC) 08/24/2019   History of pulmonary embolism 08/12/2019   Hyperlipidemia 01/24/2019   Hypertension 01/24/2019   Hypoalbuminemia due to protein-calorie malnutrition    Hypokalemia    Hyponatremia    Hypotension due to drugs    Insomnia 01/24/2019   Lactic acidosis 08/23/2019   Lumbar disc herniation 01/24/2019   Mixed hyperlipidemia 01/24/2019   Morbid obesity with BMI of 40.0-44.9, adult (HCC) 05/30/2019   Normocytic anemia 06/08/2019   Occasional tremors 08/12/2019   OSA (obstructive sleep apnea) 08/12/2019   Palpitations 12/26/2019   Peripheral edema    Personal history of DVT (deep vein thrombosis) 12/26/2019   Portal hypertensive  gastropathy (HCC) 03/06/2019   Seen on EGD 03/06/2019   Pulmonary emboli (HCC) 06/08/2019   Pulmonary embolism (HCC) 06/08/2019   Redness and swelling of lower leg 08/23/2019   Shock (HCC)    Thrombocytopenia    Past Surgical History:  Procedure Laterality Date   APPLICATION OF WOUND VAC Left 01/27/2024   Procedure: APPLICATION, WOUND VAC;  Surgeon: Genelle Standing, MD;  Location: [facility] OR;  Service: Orthopedics;  Laterality: Left;   BIOPSY  04/08/2021   Procedure: BIOPSY;  Surgeon: Aneita Gwendlyn DASEN, MD;  Location: [facility] ENDOSCOPY;  Service: Endoscopy;;   ESOPHAGOGASTRODUODENOSCOPY N/A 04/08/2021   Procedure: ESOPHAGOGASTRODUODENOSCOPY (EGD);  Surgeon: Aneita Gwendlyn DASEN, MD;  Location: [facility] ENDOSCOPY;  Service: Endoscopy;  Laterality: N/A;   ESOPHAGOGASTRODUODENOSCOPY (EGD) WITH PROPOFOL  N/A 03/05/2019   Procedure: ESOPHAGOGASTRODUODENOSCOPY (EGD) WITH PROPOFOL ;  Surgeon: Legrand Victory LITTIE DOUGLAS, MD;  Location: [facility] ENDOSCOPY;  Service: Gastroenterology;  Laterality: N/A;  Large Blood Clot Removed   ESOPHAGOGASTRODUODENOSCOPY (EGD) WITH PROPOFOL  N/A 03/06/2019   Procedure: ESOPHAGOGASTRODUODENOSCOPY (EGD) WITH PROPOFOL ;  Surgeon: Eda Iha, MD;  Location: [facility] ENDOSCOPY;  Service: Gastroenterology;  Laterality: N/A;   HEMORROIDECTOMY     INCISION AND DRAINAGE OF WOUND Left 01/27/2024   Procedure: IRRIGATION AND DEBRIDEMENT WOUND;  Surgeon: Genelle Standing, MD;  Location: [facility] OR;  Service: Orthopedics;  Laterality: Left;   PRESSURE SENSOR/CARDIOMEMS N/A 11/14/2020   Procedure: PRESSURE SENSOR/CARDIOMEMS;  Surgeon: Rolan Ezra RAMAN, MD;  Location: [facility] INVASIVE CV LAB;  Service: Cardiovascular;  Laterality: N/A;   RIGHT HEART CATH  N/A 08/09/2019   Procedure: RIGHT HEART CATH;  Surgeon: Rolan Ezra RAMAN, MD;  Location: [facility] INVASIVE CV LAB;  Service: Cardiovascular;  Laterality: N/A;   RIGHT HEART CATH N/A 11/01/2020   Procedure: RIGHT HEART CATH;  Surgeon: Rolan Ezra RAMAN, MD;  Location: [facility] INVASIVE CV LAB;   Service: Cardiovascular;  Laterality: N/A;   RIGHT HEART CATH N/A 11/14/2020   Procedure: RIGHT HEART CATH;  Surgeon: Rolan Ezra RAMAN, MD;  Location: [facility] INVASIVE CV LAB;  Service: Cardiovascular;  Laterality: N/A;   RIGHT/LEFT HEART CATH AND CORONARY ANGIOGRAPHY N/A 05/10/2020   Procedure: RIGHT/LEFT HEART CATH AND CORONARY ANGIOGRAPHY;  Surgeon: Rolan Ezra RAMAN, MD;  Location: [facility] INVASIVE CV LAB;  Service: Cardiovascular;  Laterality: N/A;   Social History   Socioeconomic History   Marital status: Single    Spouse name: Not on file   Number of children: Not on file   Years of education: Not on file   Highest education level: Not on file  Occupational History   Not on file  Tobacco Use   Smoking status: Never   Smokeless tobacco: Never  Vaping Use   Vaping status: Never Used  Substance and Sexual Activity   Alcohol use: Not Currently   Drug use: Not Currently    Types: Marijuana    Comment: Smoked for 30 years   Sexual activity: Not on file  Other Topics Concern   Not on file  Social History Narrative   Not on file   Social Drivers of Health   Financial Resource Strain: Medium Risk (11/15/2020)   Overall Financial Resource Strain (CARDIA)    Difficulty of Paying Living Expenses: Somewhat hard  Food Insecurity: No Food Insecurity (01/28/2024)   Hunger Vital Sign    Worried About Running Out of Food in the Last Year: Never true    Ran Out of Food in the Last Year: Never true  Transportation Needs: Unmet Transportation Needs (01/28/2024)   PRAPARE - Administrator, Civil Service (Medical): Yes    Lack of Transportation (Non-Medical): Yes  Physical Activity: Not on file  Stress: Not on file  Social Connections: Socially Integrated (01/28/2024)   Social Connection and Isolation Panel    Frequency of Communication with Friends and Family: More than three times a week    Frequency of Social Gatherings with Friends and Family: Three times a week    Attends Religious  Services: More than 4 times per year    Active Member of Clubs or Organizations: No    Attends Engineer, structural: More than 4 times per year    Marital Status: Married   Family History  Problem Relation Age of Onset   Hyperlipidemia Mother    Hypertension Mother    Stroke Mother    CAD Maternal Grandmother    CAD Maternal Grandfather    - negative except otherwise stated in the family history section Allergies  Allergen Reactions   Penicillins Other (See Comments)    Childhood reaction - convulsions    Prior to Admission medications   Medication Sig Start Date End Date Taking? Authorizing Provider  acetaminophen  (TYLENOL ) 500 MG tablet Take 1,000 mg by mouth every 6 (six) hours. x6 days (01/21/24-01/27/24)   Yes [provider]  ascorbic acid (VITAMIN C) 500 MG tablet Take 500 mg by mouth daily.   Yes [provider]  ferrous sulfate  325 (65 FE) MG tablet Take 1 tablet (325 mg total) by mouth daily with breakfast.  11/05/23  Yes Tobie Yetta HERO, MD  folic acid (FOLVITE) 1 MG tablet Take 1 mg by mouth daily.   Yes [provider]  gabapentin  (NEURONTIN ) 800 MG tablet Take 1 tablet (800 mg total) by mouth 3 (three) times daily. 11/04/23  Yes Tobie Yetta HERO, MD  lactulose , encephalopathy, (CHRONULAC ) 10 GM/15ML SOLN Take 30 mLs (20 g total) by mouth 2 (two) times daily. Patient taking differently: Take 20 g by mouth daily as needed (constipation). 03/11/22  Yes Fairy Frames, MD  magnesium  oxide (MAG-OX) 400 (240 Mg) MG tablet Take 400 mg by mouth daily.   Yes [provider]  methocarbamol  (ROBAXIN ) 500 MG tablet Take 1 tablet (500 mg total) by mouth 3 (three) times daily. 11/04/23  Yes Tobie Yetta HERO, MD  methocarbamol  (ROBAXIN ) 750 MG tablet Take 750 mg by mouth 3 (three) times daily.   Yes [provider]  metoprolol  succinate (TOPROL -XL) 25 MG 24 hr tablet TAKE ONE TABLET BY MOUTH AT BEDTIME Patient taking differently: Take 25  mg by mouth daily. 03/23/23  Yes Rolan Ezra RAMAN, MD  naloxone  (NARCAN ) nasal spray 4 mg/0.1 mL Place 0.4 mg into the nose See admin instructions. Administer 1 spray (0.4mg ), alternating nostrils ever 65 minutes as needed for unresponsiveness. Call MD and EMS if given.   Yes [provider]  omeprazole (PRILOSEC) 20 MG capsule Take 20 mg by mouth daily before breakfast.   Yes [provider]  ondansetron  (ZOFRAN ) 8 MG tablet Take 8 mg by mouth every 8 (eight) hours as needed for nausea or vomiting.   Yes [provider]  oxyCODONE  (ROXICODONE ) 15 MG immediate release tablet Take 1 tablet (15 mg total) by mouth every 4 (four) hours as needed for severe pain (pain score 7-10) or moderate pain (pain score 4-6). 11/04/23  Yes Tobie Yetta HERO, MD  polyethylene glycol powder (GLYCOLAX /MIRALAX ) 17 GM/SCOOP powder Take 17 g by mouth daily as needed (constipation).   Yes [provider]  potassium chloride  SA (KLOR-CON  M) 20 MEQ tablet Take 20 mEq by mouth 3 (three) times daily.   Yes [provider]  pravastatin  (PRAVACHOL ) 20 MG tablet Take 1 tablet by mouth daily. Patient taking differently: Take 20 mg by mouth daily at 6 PM. 09/16/23  Yes Rolan Ezra RAMAN, MD  sennosides-docusate sodium  (SENOKOT-S) 8.6-50 MG tablet Take 2 tablets by mouth in the morning and at bedtime.   Yes [provider]  sertraline  (ZOLOFT ) 100 MG tablet Take 100 mg by mouth daily.   Yes [provider]  tamsulosin  (FLOMAX ) 0.4 MG CAPS capsule Take 1 capsule (0.4 mg total) by mouth daily. Additional refills will need to come from PCP Patient taking differently: Take 0.4 mg by mouth daily at 6 PM. 04/01/22  Yes McLean, Dalton S, MD  torsemide  (DEMADEX ) 20 MG tablet Take 40 mg by mouth daily.   Yes [provider]  VENTOLIN  HFA 108 (90 Base) MCG/ACT inhaler Inhale 1 puff into the lungs every 4 (four) hours as needed for shortness of breath. Patient taking differently:  Inhale 2 puffs into the lungs every 6 (six) hours as needed for wheezing. 03/23/19  Yes Angiulli, Toribio PARAS, PA-C  zolpidem  (AMBIEN ) 10 MG tablet Take 1 tablet (10 mg total) by mouth at bedtime as needed for sleep. 11/04/23  Yes Tobie Yetta HERO, MD  apixaban  (ELIQUIS ) 5 MG TABS tablet Take 1 tablet (5 mg total) by mouth 2 (two) times daily. Patient not taking: Reported on 01/28/2024  11/19/21   Bensimhon, Toribio SAUNDERS, MD  bumetanide  (BUMEX ) 1 MG tablet Take 4 tablets (4 mg total) by mouth daily with breakfast AND 3 tablets (3 mg total) every evening. 05/10/20 06/13/20  Rolan Ezra RAMAN, MD  metoprolol  tartrate (LOPRESSOR ) 50 MG tablet Take 1 tablet (50 mg total) by mouth 2 (two) times daily. 02/09/20 04/11/20  Gonfa, Taye T, MD   No results found. - pertinent xrays, CT, MRI studies were reviewed and independently interpreted  Positive ROS: All other systems have been reviewed and were otherwise negative with the exception of those mentioned in the HPI and as above.  Physical Exam: General: Alert, no acute distress Psychiatric: Patient is competent for consent with normal mood and affect Lymphatic: No axillary or cervical lymphadenopathy Cardiovascular: No pedal edema Respiratory: No cyanosis, no use of accessory musculature GI: No organomegaly, abdomen is soft and non-tender    Images:  @ENCIMAGES @  Labs:  Lab Results  Component Value Date   HGBA1C 5.2 10/27/2023   HGBA1C 5.9 (H) 07/10/2021   HGBA1C 5.2 01/28/2020   CRP 1.0 (H) 08/23/2019   LABURIC 7.9 03/10/2022   REPTSTATUS 07/12/2021 FINAL 07/07/2021   CULT  07/07/2021    NO GROWTH 5 DAYS Performed at [facility], [address], [place], [place] [address]    LABORGA STAPHYLOCOCCUS EPIDERMIDIS (A) 07/07/2021   LABORGA ENTEROCOCCUS FAECALIS (A) 07/07/2021    Lab Results  Component Value Date   ALBUMIN  2.5 (L) 01/29/2024   ALBUMIN  2.9 (L) 01/27/2024   ALBUMIN  3.2 (L) 10/24/2023   LABURIC 7.9 03/10/2022        Latest  Ref Rng & Units 01/29/2024    9:50 AM 01/28/2024    4:28 AM 01/27/2024   12:56 PM  CBC EXTENDED  WBC 4.0 - 10.5 K/uL 6.2  9.7  5.6   RBC 4.22 - 5.81 MIL/uL 2.74  3.11  3.66   Hemoglobin 13.0 - 17.0 g/dL 6.9  8.0  9.3   HCT 60.9 - 52.0 % 23.0  25.4  30.9   Platelets 150 - 400 K/uL 133  148  118   NEUT# 1.7 - 7.7 K/uL   4.4   Lymph# 0.7 - 4.0 K/uL   0.6     Neurologic: Patient does not have protective sensation bilateral lower extremities.   MUSCULOSKELETAL:   Skin: Examination the wound VAC is in place.  Patient has 350 cc in the wound VAC canister.  Hemoglobin 6.9 this morning with a white cell count of 6.2.  Patient receiving a transfusion.  Nasal swab negative.  Tissue cultures not available. Assessment: Assessment: Necrotic skin and soft tissue left leg secondary to a hematoma.  Plan: Plan: Will plan for debridement left leg with application Kerecis micro graft and a wound VAC.  Patient's hemoglobin will need to be greater than 9 for surgery on Wednesday.  Thank you for the consult and the opportunity to see Mr. Merrick Maggio, MD [facility] [phone] 3:34 PM

## 2024-01-30 DIAGNOSIS — S8012XD Contusion of left lower leg, subsequent encounter: Secondary | ICD-10-CM | POA: Diagnosis not present

## 2024-01-30 DIAGNOSIS — D62 Acute posthemorrhagic anemia: Secondary | ICD-10-CM | POA: Diagnosis not present

## 2024-01-30 LAB — BASIC METABOLIC PANEL WITH GFR
Anion gap: 9 (ref 5–15)
BUN: 15 mg/dL (ref 8–23)
CO2: 25 mmol/L (ref 22–32)
Calcium: 8.4 mg/dL — ABNORMAL LOW (ref 8.9–10.3)
Chloride: 104 mmol/L (ref 98–111)
Creatinine, Ser: 0.85 mg/dL (ref 0.61–1.24)
GFR, Estimated: >60 mL/min (ref >60)
Glucose, Bld: 86 mg/dL (ref 70–99)
Potassium: 4 mmol/L (ref 3.5–5.1)
Sodium: 138 mmol/L (ref 135–145)

## 2024-01-30 LAB — CBC
HCT: 24.3 % — ABNORMAL LOW (ref 39.0–52.0)
Hemoglobin: 7.4 g/dL — ABNORMAL LOW (ref 13.0–17.0)
MCH: 25.6 pg — ABNORMAL LOW (ref 26.0–34.0)
MCHC: 30.5 g/dL (ref 30.0–36.0)
MCV: 84.1 fL (ref 80.0–100.0)
Platelets: 121 K/uL — ABNORMAL LOW (ref 150–400)
RBC: 2.89 MIL/uL — ABNORMAL LOW (ref 4.22–5.81)
RDW: 16.1 % — ABNORMAL HIGH (ref 11.5–15.5)
WBC: 4.5 K/uL (ref 4.0–10.5)
nRBC: 0 % (ref 0.0–0.2)

## 2024-01-30 LAB — PREPARE RBC (CROSSMATCH)

## 2024-01-30 LAB — HEMOGLOBIN AND HEMATOCRIT, BLOOD
HCT: 26.1 % — ABNORMAL LOW (ref 39.0–52.0)
Hemoglobin: 8.2 g/dL — ABNORMAL LOW (ref 13.0–17.0)

## 2024-01-30 MED ORDER — MAGNESIUM OXIDE -MG SUPPLEMENT 400 (240 MG) MG PO TABS
400.0000 mg | ORAL_TABLET | Freq: Every day | ORAL | Status: DC
  Administered 2024-01-30 – 2024-02-04 (×5): 400 mg via ORAL
  Filled 2024-01-30 (×6): qty 1

## 2024-01-30 MED ORDER — METHOCARBAMOL 500 MG PO TABS
500.0000 mg | ORAL_TABLET | Freq: Three times a day (TID) | ORAL | Status: DC
  Administered 2024-01-30 – 2024-02-04 (×14): 500 mg via ORAL
  Filled 2024-01-30 (×16): qty 1

## 2024-01-30 MED ORDER — PANTOPRAZOLE SODIUM 40 MG PO TBEC
40.0000 mg | DELAYED_RELEASE_TABLET | Freq: Every day | ORAL | Status: DC
  Administered 2024-01-30 – 2024-02-04 (×5): 40 mg via ORAL
  Filled 2024-01-30 (×6): qty 1

## 2024-01-30 MED ORDER — TAMSULOSIN HCL 0.4 MG PO CAPS
0.4000 mg | ORAL_CAPSULE | Freq: Every day | ORAL | Status: DC
  Administered 2024-01-30 – 2024-02-03 (×5): 0.4 mg via ORAL
  Filled 2024-01-30 (×5): qty 1

## 2024-01-30 MED ORDER — VITAMIN C 500 MG PO TABS
500.0000 mg | ORAL_TABLET | Freq: Every day | ORAL | Status: DC
  Administered 2024-01-30 – 2024-02-04 (×5): 500 mg via ORAL
  Filled 2024-01-30 (×6): qty 1

## 2024-01-30 MED ORDER — FERROUS SULFATE 325 (65 FE) MG PO TABS
325.0000 mg | ORAL_TABLET | Freq: Every day | ORAL | Status: DC
  Administered 2024-01-31 – 2024-02-04 (×4): 325 mg via ORAL
  Filled 2024-01-30 (×5): qty 1

## 2024-01-30 MED ORDER — LACTULOSE 10 GM/15ML PO SOLN
20.0000 g | Freq: Two times a day (BID) | ORAL | Status: DC
  Filled 2024-01-30 (×9): qty 30

## 2024-01-30 MED ORDER — FOLIC ACID 1 MG PO TABS
1.0000 mg | ORAL_TABLET | Freq: Every day | ORAL | Status: DC
  Administered 2024-01-30 – 2024-02-04 (×5): 1 mg via ORAL
  Filled 2024-01-30 (×6): qty 1

## 2024-01-30 MED ORDER — SODIUM CHLORIDE 0.9% IV SOLUTION: Freq: Once | INTRAVENOUS | Status: DC

## 2024-01-30 MED ORDER — GABAPENTIN 400 MG PO CAPS
800.0000 mg | ORAL_CAPSULE | Freq: Three times a day (TID) | ORAL | Status: DC
  Administered 2024-01-30 – 2024-02-04 (×14): 800 mg via ORAL
  Filled 2024-01-30 (×15): qty 2

## 2024-01-30 NOTE — Progress Notes (Signed)
 Patient ID: Charles Gray, male   DOB: July 24, 1955, 68 y.o.   MRN: [id number] The patient is awake and alert.  His left lower extremity back is to suction and has a good seal with good output.  I did see that my partner Dr. Harden saw the patient yesterday and is anticipating surgery later this week with skin grafting.  I also saw from his note that he would like the patient's hemoglobin to be 9.  His hemoglobin is below 8 this morning.  His vital signs are stable.  The patient understands that surgery will be later this week.

## 2024-01-30 NOTE — Plan of Care (Signed)

## 2024-01-30 NOTE — Progress Notes (Signed)
 PROGRESS NOTE   Charles Gray  FMW:[id number]    DOB: 1955/10/21    DOA: 01/27/2024  PCP: Sabas Norleen PARAS., MD   I have briefly reviewed patients previous medical records in [facility].   Brief Hospital Course:  68 year old male, current resident of SNF, PMH of paroxysmal A-fib, COPD, cirrhosis, DVT/PE in 2021, chronic diastolic CHF, HTN, OSA, HLD, low back pain, anxiety, CAD, iron  deficiency anemia, recent hospitalization to [facility] due to spontaneous left lower extremity hematoma, active extravasation on CTA, IR and general surgery were consulted, it does not appear that he underwent any specific interventions, anticoagulation was reversed, anticoagulants were held, subsequently discharged back to SNF.  She presented to the ED on 01/27/2024 after his left leg wound/hematoma opened up during dressing change at SNF draining dark bloody material along with left lower extremity pain and swelling.  10/16, underwent I&D of subcutaneous tissue including fascia wound with placement of wound VAC.  Per orthopedics, will need further I&D, next scheduled for 10/22 with Dr. Harden.  He would like hemoglobin greater than 9 for procedure.   Assessment & Plan:   Left leg hematoma, chronic Complicated by necrotic skin and soft tissue of left leg Orthopedics was consulted and continue to assist with management. 10/16, underwent I&D of subcutaneous tissue including fascia wound with placement of wound VAC.   Per orthopedics, will need further I&D, next scheduled for 10/22 with Dr. Harden.  He would like hemoglobin greater than 9 for procedure. Multimodality pain management.  Resume home dose of gabapentin  800 mg 3 times daily, Robaxin  500 mg 3 times daily.   Hypokalemia Replaced   Acute blood loss anemia complicating chronic normocytic anemia Appears to have chronic anemia with most recent hemoglobin at OSH in the 9 g range. Hemoglobin dropped to a low of 6.9 on 10/18, after unit of PRBC, hemoglobin  up to 7.4. Orthopedics would like hemoglobin 9 g or later due to upcoming surgery.  Will transfuse additional 2 units of PRBC and follow CBC.  Patient agreeable to transfusion. Per orthopedics, hemoglobin drop may be due to oozing from the surgical site. Resumed home dose of ferrous sulfate , folate.   Thrombocytopenia Chronic and stable   Paroxysmal A-fib Chronic diastolic CHF HTN CAD HLD Currently either sinus rhythm or occasionally sinus bradycardia in the 50s.  Clinically euvolemic. Continue home dose of metoprolol .  No anticoagulation-Eliquis  was stopped 10/9. Monitor on telemetry Follows with Dr. Rolan, cardiology.   COPD OSA Stable.  Patient indicates that he is not on oxygen or CPAP   History of DVT/PE Likely not on anticoagulation at this time due to left leg hematoma.  Await home med rec completion   Cirrhosis Unclear if he has some degree of ascites versus abdominal protuberance from obesity. Overall appears compensated   Constipation Initiated bowel regimen   Body mass index is 32.41 kg/m./Class I obesity Complicates care.  Outpatient follow-up.  Body mass index is 32.41 kg/m.   DVT prophylaxis:   Right lower extremity SCD.  Unfortunately unable to use chemical DVT prophylaxis due to high risk for bleeding and acute blood loss anemia from left lower extremity wound/hematoma and also cannot use SCD on right leg.   Code Status: Full Code:  Family Communication: None at bedside. Disposition:  Status is: Inpatient Remains inpatient appropriate because: Need for further inpatient surgery on left leg.     Consultants:   Orthopedics  Procedures:   As above  Subjective:  Patient reports some pain in the  left leg.  States that he had a small BM in an accident yesterday.  Abdomen still feels bloated and still feels constipated.  Objective:   Vitals:   01/29/24 1425 01/29/24 2035 01/29/24 2155 01/30/24 0439  BP: 123/71 122/73  105/67  Pulse: 67 64  (!)  58  Resp: 16 13  15   Temp: 98.3 F (36.8 C) 98.9 F (37.2 C) 99.1 F (37.3 C) 97.8 F (36.6 C)  TempSrc: Oral  Oral   SpO2: 100% 100%    Weight:      Height:        General exam: Middle-age male, moderately built and obese lying comfortably propped up in bed without distress.  Oral mucosa moist. Respiratory system: Clear to auscultation. Respiratory effort normal. Cardiovascular system: S1 & S2 heard, RRR. No JVD, murmurs, rubs, gallops or clicks. No pedal edema.  Telemetry personally reviewed: Mostly sinus rhythm, occasionally sinus bradycardia in the high 50s with occasional PVCs. Gastrointestinal system: Abdomen is nondistended, soft and nontender. No organomegaly or masses felt. Normal bowel sounds heard. Central nervous system: Alert and oriented. No focal neurological deficits. Extremities: Symmetric 5 x 5 power.  Left leg with postop dressing/Ace wrapping, clean and dry.  Has wound VAC with dark bloody drainage-150 mL documented in the last 24 hours. Skin: No rashes, lesions or ulcers Psychiatry: Judgement and insight appear normal. Mood & affect appropriate.     Data Reviewed:   I have personally reviewed following labs and imaging studies   CBC: Recent Labs  Lab 01/27/24 1256 01/28/24 0428 01/29/24 0950 01/29/24 1557 01/30/24 0754  WBC 5.6 9.7 6.2  --  4.5  NEUTROABS 4.4  --   --   --   --   HGB 9.3* 8.0* 6.9* 7.5* 7.4*  HCT 30.9* 25.4* 23.0* 23.9* 24.3*  MCV 84.4 81.7 83.9  --  84.1  PLT 118* 148* 133*  --  121*    Basic Metabolic Panel: Recent Labs  Lab 01/27/24 1256 01/29/24 0950 01/30/24 0754  NA 140 139 138  K 3.4* 4.0 4.0  CL 103 106 104  CO2 24 23 25   GLUCOSE 106* 95 86  BUN 14 19 15   CREATININE 0.82 0.77 0.85  CALCIUM 8.7* 8.4* 8.4*    Liver Function Tests: Recent Labs  Lab 01/27/24 1256 01/29/24 0950  AST 18 13*  ALT 12 9  ALKPHOS 96 65  BILITOT 0.8 0.4  PROT 6.2* 5.5*  ALBUMIN  2.9* 2.5*    CBG: No results for input(s):  GLUCAP in the last 168 hours.  Microbiology Studies:   Recent Results (from the past 240 hours)  Surgical pcr screen     Status: None   Collection Time: 01/27/24  4:45 PM   Specimen: Nasal Mucosa; Nasal Swab  Result Value Ref Range Status   MRSA, PCR NEGATIVE NEGATIVE Final   Staphylococcus aureus NEGATIVE NEGATIVE Final    Comment: (NOTE) The Xpert SA Assay (FDA approved for NASAL specimens in patients 64 years of age and older), is one component of a comprehensive surveillance program. It is not intended to diagnose infection nor to guide or monitor treatment. Performed at [facility], [address], [place], [place] [address]     Radiology Studies:  No results found.  Scheduled Meds:    metoprolol  succinate  25 mg Oral Daily   polyethylene glycol  17 g Oral BID   pravastatin   20 mg Oral Daily   senna  1 tablet Oral BID  sertraline   100 mg Oral QHS   sodium chloride  flush  3 mL Intravenous Q12H    Continuous Infusions:     LOS: 3 days     Trenda Mar, MD,  FACP, [facility], [facility]   [facility] [place]      To contact the attending provider between 7A-7P or the covering provider during after hours 7P-7A, please log into the web site [email or web address] and access using universal [place] password for that web site. If you do not have the password, please call the hospital operator.  01/30/2024, 10:52 AM

## 2024-01-31 DIAGNOSIS — D62 Acute posthemorrhagic anemia: Secondary | ICD-10-CM | POA: Diagnosis not present

## 2024-01-31 DIAGNOSIS — S8012XD Contusion of left lower leg, subsequent encounter: Secondary | ICD-10-CM | POA: Diagnosis not present

## 2024-01-31 LAB — CBC
HCT: 26.1 % — ABNORMAL LOW (ref 39.0–52.0)
HCT: 30.3 % — ABNORMAL LOW (ref 39.0–52.0)
Hemoglobin: 8.2 g/dL — ABNORMAL LOW (ref 13.0–17.0)
Hemoglobin: 9.5 g/dL — ABNORMAL LOW (ref 13.0–17.0)
MCH: 26.4 pg (ref 26.0–34.0)
MCH: 26.6 pg (ref 26.0–34.0)
MCHC: 31.4 g/dL (ref 30.0–36.0)
MCHC: 31.4 g/dL (ref 30.0–36.0)
MCV: 83.9 fL (ref 80.0–100.0)
MCV: 84.9 fL (ref 80.0–100.0)
Platelets: 115 K/uL — ABNORMAL LOW (ref 150–400)
Platelets: 138 K/uL — ABNORMAL LOW (ref 150–400)
RBC: 3.11 MIL/uL — ABNORMAL LOW (ref 4.22–5.81)
RBC: 3.57 MIL/uL — ABNORMAL LOW (ref 4.22–5.81)
RDW: 15.7 % — ABNORMAL HIGH (ref 11.5–15.5)
RDW: 15.7 % — ABNORMAL HIGH (ref 11.5–15.5)
WBC: 3.8 K/uL — ABNORMAL LOW (ref 4.0–10.5)
WBC: 5.2 K/uL (ref 4.0–10.5)
nRBC: 0 % (ref 0.0–0.2)
nRBC: 0 % (ref 0.0–0.2)

## 2024-01-31 LAB — BPAM RBC
Blood Product Expiration Date: 202511152359
Blood Product Expiration Date: 202511152359
Blood Product Expiration Date: 202511152359
ISSUE DATE / TIME: 202510181150
ISSUE DATE / TIME: 202510191252
ISSUE DATE / TIME: 202510191602
Unit Type and Rh: 5100
Unit Type and Rh: 5100
Unit Type and Rh: 5100

## 2024-01-31 LAB — TYPE AND SCREEN
ABO/RH(D): O POS
Antibody Screen: NEGATIVE
Unit division: 0
Unit division: 0
Unit division: 0

## 2024-01-31 LAB — BASIC METABOLIC PANEL WITH GFR
Anion gap: 10 (ref 5–15)
BUN: 15 mg/dL (ref 8–23)
CO2: 22 mmol/L (ref 22–32)
Calcium: 8.2 mg/dL — ABNORMAL LOW (ref 8.9–10.3)
Chloride: 106 mmol/L (ref 98–111)
Creatinine, Ser: 0.96 mg/dL (ref 0.61–1.24)
GFR, Estimated: >60 mL/min (ref >60)
Glucose, Bld: 111 mg/dL — ABNORMAL HIGH (ref 70–99)
Potassium: 3.6 mmol/L (ref 3.5–5.1)
Sodium: 138 mmol/L (ref 135–145)

## 2024-01-31 LAB — PREPARE RBC (CROSSMATCH)

## 2024-01-31 MED ORDER — ZOLPIDEM TARTRATE 5 MG PO TABS
5.0000 mg | ORAL_TABLET | Freq: Every evening | ORAL | Status: AC | PRN
  Administered 2024-01-31 – 2024-02-01 (×2): 5 mg via ORAL
  Filled 2024-01-31 (×2): qty 1

## 2024-01-31 MED ORDER — SODIUM CHLORIDE 0.9% IV SOLUTION: Freq: Once | INTRAVENOUS | Status: AC

## 2024-01-31 MED ORDER — MILK AND MOLASSES ENEMA
1.0000 | Freq: Once | RECTAL | Status: DC
  Filled 2024-01-31: qty 150

## 2024-01-31 NOTE — Plan of Care (Signed)

## 2024-01-31 NOTE — Progress Notes (Signed)
 PROGRESS NOTE   Charles Gray  FMW:[id number]    DOB: November 28, 1955    DOA: 01/27/2024  PCP: Sabas Norleen PARAS., MD   I have briefly reviewed patients previous medical records in [facility].   Brief Hospital Course:  68 year old male, current resident of SNF, PMH of paroxysmal A-fib, COPD, cirrhosis, DVT/PE in 2021, chronic diastolic CHF, HTN, OSA, HLD, low back pain, anxiety, CAD, iron  deficiency anemia, recent hospitalization to [facility] due to spontaneous left lower extremity hematoma, active extravasation on CTA, IR and general surgery were consulted, it does not appear that he underwent any specific interventions, anticoagulation was reversed, anticoagulants were held, subsequently discharged back to SNF.  She presented to the ED on 01/27/2024 after his left leg wound/hematoma opened up during dressing change at SNF draining dark bloody material along with left lower extremity pain and swelling.  10/16, underwent I&D of subcutaneous tissue including fascia wound with placement of wound VAC.  Per orthopedics, will need further I&D, next scheduled for 10/22 with Dr. Harden.  He would like hemoglobin greater than 9 for procedure.   Assessment & Plan:   Left leg hematoma, chronic Complicated by necrotic skin and soft tissue of left leg Orthopedics was consulted and continue to assist with management. 10/16, underwent I&D of subcutaneous tissue including fascia wound with placement of wound VAC.   Per orthopedics, will need further I&D, next scheduled for 10/22 with Dr. Harden.  He would like hemoglobin greater than 9 for procedure. Multimodality pain management.  Resumed home dose of gabapentin  800 mg 3 times daily, Robaxin  500 mg 3 times daily.  Pain appears to be better controlled.   Hypokalemia Replaced   Acute blood loss anemia complicating chronic normocytic anemia Appears to have chronic anemia with most recent hemoglobin at OSH in the 9 g range. Per orthopedics, hemoglobin drop may  be due to oozing from the surgical site. Resumed home dose of ferrous sulfate , folate. This admission, hemoglobin dropped to a low of 6.9.  Orthopedics would like hemoglobin >9 for surgery on Wednesday.  Thus far has received 3 units PRBC.  Hemoglobin 8.2.  Will transfuse 1 unit (4th unit thus far) and follow CBC.   Thrombocytopenia Chronic and stable   Paroxysmal A-fib Chronic diastolic CHF HTN CAD HLD Currently either sinus rhythm or occasionally sinus bradycardia in the 50s.  Clinically euvolemic. Continue home dose of metoprolol .  No anticoagulation-Eliquis  was stopped 10/9. Monitor on telemetry Follows with Dr. Rolan, cardiology.   COPD OSA Stable.  Patient indicates that he is not on oxygen or CPAP   History of DVT/PE Likely not on anticoagulation at this time due to left leg hematoma.  Await home med rec completion   Cirrhosis Unclear if he has some degree of ascites versus abdominal protuberance from obesity. Overall appears compensated   Constipation Initiated bowel regimen.  Despite BM on 10/18.  Feels constipated.  Will give a enema.   Body mass index is 32.41 kg/m./Class I obesity Complicates care.  Outpatient follow-up.  Body mass index is 32.41 kg/m.   DVT prophylaxis: Place and maintain sequential compression device Start: 01/30/24 1714  Right lower extremity SCD.  Unfortunately unable to use chemical DVT prophylaxis due to high risk for bleeding and acute blood loss anemia from left lower extremity wound/hematoma and also cannot use SCD on right leg.   Code Status: Full Code:  Family Communication: None at bedside. Disposition:  Status is: Inpatient Remains inpatient appropriate because: Need for further inpatient surgery on  left leg.     Consultants:   Orthopedics  Procedures:   As above  Subjective:  No pain reported in left leg.  Ongoing constipation.  Objective:   Vitals:   01/30/24 1953 01/31/24 0407 01/31/24 0758 01/31/24 1009  BP:  (!) 100/54 102/60 100/68 102/68  Pulse: (!) 59 (!) 58 (!) 57 60  Resp: 18 17    Temp: 98.2 F (36.8 C) 98.4 F (36.9 C) 98 F (36.7 C)   TempSrc:      SpO2: 98% 98% 99%   Weight:      Height:        General exam: Middle-age male, moderately built and obese lying comfortably propped up in bed without distress.  Oral mucosa moist. Respiratory system: Clear to auscultation. Respiratory effort normal. Cardiovascular system: S1 & S2 heard, RRR. No JVD, murmurs, rubs, gallops or clicks. No pedal edema.   Gastrointestinal system: Abdomen is nondistended, soft and nontender. No organomegaly or masses felt. Normal bowel sounds heard. Central nervous system: Alert and oriented. No focal neurological deficits. Extremities: Symmetric 5 x 5 power.  Left leg with postop dressing/Ace wrapping, clean and dry.  Has wound VAC with dark bloody drainage-no output seems to be documented yesterday. Skin: No rashes, lesions or ulcers Psychiatry: Judgement and insight appear normal. Mood & affect appropriate.     Data Reviewed:   I have personally reviewed following labs and imaging studies   CBC: Recent Labs  Lab 01/27/24 1256 01/28/24 0428 01/29/24 0950 01/29/24 1557 01/30/24 0754 01/30/24 2219 01/31/24 0715  WBC 5.6   < > 6.2  --  4.5  --  3.8*  NEUTROABS 4.4  --   --   --   --   --   --   HGB 9.3*   < > 6.9*   < > 7.4* 8.2* 8.2*  HCT 30.9*   < > 23.0*   < > 24.3* 26.1* 26.1*  MCV 84.4   < > 83.9  --  84.1  --  83.9  PLT 118*   < > 133*  --  121*  --  115*   < > = values in this interval not displayed.    Basic Metabolic Panel: Recent Labs  Lab 01/27/24 1256 01/29/24 0950 01/30/24 0754 01/31/24 0715  NA 140 139 138 138  K 3.4* 4.0 4.0 3.6  CL 103 106 104 106  CO2 24 23 25 22   GLUCOSE 106* 95 86 111*  BUN 14 19 15 15   CREATININE 0.82 0.77 0.85 0.96  CALCIUM 8.7* 8.4* 8.4* 8.2*    Liver Function Tests: Recent Labs  Lab 01/27/24 1256 01/29/24 0950  AST 18 13*  ALT 12 9   ALKPHOS 96 65  BILITOT 0.8 0.4  PROT 6.2* 5.5*  ALBUMIN  2.9* 2.5*    CBG: No results for input(s): GLUCAP in the last 168 hours.  Microbiology Studies:   Recent Results (from the past 240 hours)  Surgical pcr screen     Status: None   Collection Time: 01/27/24  4:45 PM   Specimen: Nasal Mucosa; Nasal Swab  Result Value Ref Range Status   MRSA, PCR NEGATIVE NEGATIVE Final   Staphylococcus aureus NEGATIVE NEGATIVE Final    Comment: (NOTE) The Xpert SA Assay (FDA approved for NASAL specimens in patients 62 years of age and older), is one component of a comprehensive surveillance program. It is not intended to diagnose infection nor to guide or monitor treatment. Performed at [facility], [address], [place], [place] [address]     Radiology Studies:  No results found.  Scheduled Meds:    sodium chloride    Intravenous Once   ascorbic acid  500 mg Oral Daily   ferrous sulfate   325 mg Oral Q breakfast   folic acid  1 mg Oral Daily   gabapentin   800 mg Oral TID   lactulose   20 g Oral BID   magnesium  oxide  400 mg Oral Daily   methocarbamol   500 mg Oral TID   metoprolol  succinate  25 mg Oral Daily   pantoprazole   40 mg Oral Daily   polyethylene glycol  17 g Oral BID   pravastatin   20 mg Oral Daily   senna  1 tablet Oral BID   sertraline   100 mg Oral QHS   sodium chloride  flush  3 mL Intravenous Q12H   tamsulosin   0.4 mg Oral q1800    Continuous Infusions:     LOS: 4 days     Trenda Mar, MD,  FACP, [facility], [facility], [facility]   [facility] [place]      To contact the attending provider between 7A-7P or the covering provider during after hours 7P-7A, please log into the web site [email or web address] and access using universal [place] password for that web site. If you do not have the password, please call the hospital operator.  01/31/2024, 11:08 AM

## 2024-01-31 NOTE — Plan of Care (Signed)
  Problem: Education: Goal: Knowledge of General Education information will improve Description: Including pain rating scale, medication(s)/side effects and non-pharmacologic comfort measures Outcome: Progressing   Problem: Clinical Measurements: Goal: Diagnostic test results will improve Outcome: Progressing Goal: Respiratory complications will improve Outcome: Progressing Goal: Cardiovascular complication will be avoided Outcome: Progressing   Problem: Nutrition: Goal: Adequate nutrition will be maintained Outcome: Progressing   Problem: Coping: Goal: Level of anxiety will decrease Outcome: Progressing   

## 2024-01-31 NOTE — Progress Notes (Signed)
 Physical Therapy Treatment Patient Details Name: Charles Gray MRN: [id number] DOB: [birth year] Today's Date: 01/31/2024   History of Present Illness Pt is a 68 y.o. male admitted 01/27/24 for left leg pressure ulcer from hematoma, necrotic including subcutaneous tissue down to fascia. Pt s/p I&D and wound vac application 10/16. PMHx: paroxysmal A-fib, COPD, cirrhosis, DVT/PE in 2021, chronic diastolic CHF, HTN, OSA, GERD, HLD, low back pain, anxiety, CAD, and iron  deficiency anemia. Of note, recent hospitalization to [facility] d/t LLE hematoma with extravasation.    PT Comments  Pt resting in bed on arrival, agreeable to session, however continues to be limited by LLE pain, despite pre-medication. Pt with slow but steady progress towards acute goals this session with pt able to come to sitting EOB with min A and maintain sitting up EOB ~15 mins. Pt declining standing attempts this session due to pain, however pt able to laterally scoot along EOB to [facility] with mod A at bed pad. Educated pt on importance of frequent mobilization to maximize functional mobility gains and encouragement provided to sit up EOB with staff throughout day. Patient will benefit from continued inpatient follow up therapy, <3 hours/day and continues to benefit from skilled PT services to progress toward functional mobility goals.     If plan is discharge home, recommend the following: Two people to help with walking and/or transfers;Two people to help with bathing/dressing/bathroom;Assistance with cooking/housework;Assist for transportation;Help with stairs or ramp for entrance   Can travel by private vehicle     No  Equipment Recommendations  Hospital bed;Hoyer lift;Wheelchair (measurements PT);Wheelchair cushion (measurements PT)    Recommendations for Other Services       Precautions / Restrictions Precautions Precautions: Fall Recall of Precautions/Restrictions: Impaired Precaution/Restrictions Comments: Wound Vac  LLE Restrictions Weight Bearing Restrictions Per Provider Order: Yes LLE Weight Bearing Per Provider Order: Weight bearing as tolerated     Mobility  Bed Mobility Overal bed mobility: Needs Assistance Bed Mobility: Supine to Sit, Sit to Supine     Supine to sit: Min assist, HOB elevated, Used rails Sit to supine: Min assist, Used rails   General bed mobility comments: light min A to manage LLE and to scoot fully out to EOB, pt needing increased time to compelte due to pain    Transfers Overall transfer level: Needs assistance Equipment used: None Transfers: Bed to chair/wheelchair/BSC            Lateral/Scoot Transfers: Mod assist General transfer comment: pt declining attempts at standing due to pain, able to laterally scoot along EOB to [facility] with mod A at bedpad with cues for UE use    Ambulation/Gait                   Stairs             Wheelchair Mobility     Tilt Bed    Modified Rankin (Stroke Patients Only)       Balance Overall balance assessment: Needs assistance Sitting-balance support: Bilateral upper extremity supported, Feet supported Sitting balance-Leahy Scale: Good Sitting balance - Comments: able to maintain without assist                                    Communication Communication Communication: No apparent difficulties  Cognition Arousal: Alert Behavior During Therapy: Flat affect   PT - Cognitive impairments: No family/caregiver present to determine baseline  Following commands: Intact      Cueing Cueing Techniques: Verbal cues  Exercises      General Comments General comments (skin integrity, edema, etc.): VSS on RA      Pertinent Vitals/Pain Pain Assessment Pain Assessment: Faces Faces Pain Scale: Hurts even more Pain Location: LLE Pain Descriptors / Indicators: Grimacing, Moaning, Discomfort Pain Intervention(s): Premedicated before session, Monitored  during session, Limited activity within patient's tolerance    Home Living                          Prior Function            PT Goals (current goals can now be found in the care plan section) Acute Rehab PT Goals Patient Stated Goal: Regain independence and return Home PT Goal Formulation: With patient Time For Goal Achievement: 02/11/24 Progress towards PT goals: Progressing toward goals    Frequency    Min 2X/week      PT Plan      Co-evaluation              AM-PAC PT 6 Clicks Mobility   Outcome Measure  Help needed turning from your back to your side while in a flat bed without using bedrails?: A Lot Help needed moving from lying on your back to sitting on the side of a flat bed without using bedrails?: A Lot Help needed moving to and from a bed to a chair (including a wheelchair)?: Total Help needed standing up from a chair using your arms (e.g., wheelchair or bedside chair)?: Total Help needed to walk in hospital room?: Total Help needed climbing 3-5 steps with a railing? : Total 6 Click Score: 8    End of Session   Activity Tolerance: Patient limited by pain;Patient tolerated treatment well Patient left: in bed;with call bell/phone within reach;with bed alarm set Nurse Communication: Mobility status PT Visit Diagnosis: Pain;Other abnormalities of gait and mobility (R26.89);Unsteadiness on feet (R26.81);Difficulty in walking, not elsewhere classified (R26.2) Pain - Right/Left: Left Pain - part of body: Leg     Time: 9070-9047 PT Time Calculation (min) (ACUTE ONLY): 23 min  Charges:    $Therapeutic Activity: 23-37 mins PT General Charges $$ ACUTE PT VISIT: 1 Visit                     Daemon Dowty R. PTA Acute Rehabilitation Services Office: [phone]   Therisa CHRISTELLA Boor 01/31/2024, 9:56 AM

## 2024-02-01 DIAGNOSIS — D62 Acute posthemorrhagic anemia: Secondary | ICD-10-CM | POA: Diagnosis not present

## 2024-02-01 DIAGNOSIS — S8012XD Contusion of left lower leg, subsequent encounter: Secondary | ICD-10-CM | POA: Diagnosis not present

## 2024-02-01 LAB — TYPE AND SCREEN
ABO/RH(D): O POS
Antibody Screen: NEGATIVE
Unit division: 0

## 2024-02-01 LAB — BPAM RBC
Blood Product Expiration Date: 202511162359
ISSUE DATE / TIME: 202510201452
Unit Type and Rh: 5100

## 2024-02-01 LAB — CBC
HCT: 28.8 % — ABNORMAL LOW (ref 39.0–52.0)
Hemoglobin: 9.2 g/dL — ABNORMAL LOW (ref 13.0–17.0)
MCH: 27 pg (ref 26.0–34.0)
MCHC: 31.9 g/dL (ref 30.0–36.0)
MCV: 84.5 fL (ref 80.0–100.0)
Platelets: 127 K/uL — ABNORMAL LOW (ref 150–400)
RBC: 3.41 MIL/uL — ABNORMAL LOW (ref 4.22–5.81)
RDW: 15.7 % — ABNORMAL HIGH (ref 11.5–15.5)
WBC: 4.8 K/uL (ref 4.0–10.5)
nRBC: 0 % (ref 0.0–0.2)

## 2024-02-01 NOTE — TOC Progression Note (Signed)
 Transition of Care [facility]) - Progression Note    Patient Details  Name: Charles Gray MRN: [id number] Date of Birth: 18-May-1955  Transition of Care [facility]) CM/SW Contact  Bridget Cordella Simmonds, LCSW Phone Number: 02/01/2024, 2:59 PM  Clinical Narrative:   CSW spoke with [facility], [phone].  She confirmed that pt can return there at DC.  Pt is STR pt currently, has been there since August but still does have some STR days remaining.  She will need to know if they need wound vac prior to DC.  ICM will continue to follow.     Expected Discharge Plan: Skilled Nursing Facility (From [facility]) Barriers to Discharge: Continued Medical Work up               Expected Discharge Plan and Services In-house Referral: Clinical Social Work     Living arrangements for the past 2 months: Apartment                                       Social Drivers of Health (SDOH) Interventions SDOH Screenings   Food Insecurity: No Food Insecurity (01/28/2024)  Housing: Low Risk  (01/28/2024)  Transportation Needs: Unmet Transportation Needs (01/28/2024)  Utilities: Not At Risk (01/28/2024)  Alcohol Screen: Low Risk  (06/11/2020)  Depression (PHQ2-9): Medium Risk (06/11/2020)  Financial Resource Strain: Medium Risk (11/15/2020)  Social Connections: Socially Integrated (01/28/2024)  Tobacco Use: Low Risk  (01/27/2024)    Readmission Risk Interventions    01/29/2024    2:23 PM  Readmission Risk Prevention Plan  Transportation Screening Complete  PCP or Specialist Appt within 3-5 Days Complete  HRI or Home Care Consult Complete  Social Work Consult for Recovery Care Planning/Counseling Complete  Palliative Care Screening Not Applicable  Medication Review [employer]) Complete

## 2024-02-01 NOTE — Care Management Important Message (Signed)
 Important Message  Patient Details  Name: Charles Gray MRN: [id number] Date of Birth: 1955-12-05   Important Message Given:  Yes - Medicare IM     Jon Cruel 02/01/2024, 4:21 PM

## 2024-02-01 NOTE — Progress Notes (Signed)
 PROGRESS NOTE   Charles Gray  FMW:[id number]    DOB: Sep 06, 1955    DOA: 01/27/2024  PCP: Sabas Norleen PARAS., MD   I have briefly reviewed patients previous medical records in [facility].   Brief Hospital Course:  68 year old male, current resident of SNF, PMH of paroxysmal A-fib, COPD, cirrhosis, DVT/PE in 2021, chronic diastolic CHF, HTN, OSA, HLD, low back pain, anxiety, CAD, iron  deficiency anemia, recent hospitalization to [facility] due to spontaneous left lower extremity hematoma, active extravasation on CTA, IR and general surgery were consulted, it does not appear that he underwent any specific interventions, anticoagulation was reversed, anticoagulants were held, subsequently discharged back to SNF.  She presented to the ED on 01/27/2024 after his left leg wound/hematoma opened up during dressing change at SNF draining dark bloody material along with left lower extremity pain and swelling.  10/16, underwent I&D of subcutaneous tissue including fascia wound with placement of wound VAC of LLE.  Per orthopedics, will need further I&D, next scheduled for 10/22 with Dr. Harden.  He would like hemoglobin greater than 9 for procedure.   Assessment & Plan:   Left leg hematoma, chronic Complicated by necrotic skin and soft tissue of left leg Orthopedics consulted and continue to assist with management. 10/16, underwent I&D of subcutaneous tissue including fascia wound with placement of wound VAC.   Per orthopedics, will need further I&D, next scheduled for 10/22 with Dr. Harden.  He would like hemoglobin greater than 9 for procedure.  Hemoglobin up to 9.2.  Follow CBC in AM. Multimodality pain management.  Resumed home dose of gabapentin  800 mg 3 times daily, Robaxin  500 mg 3 times daily.  Pain appears to be better controlled. Wound VAC: 300 mL output documented yesterday, not sure if all of it is from yesterday or from before.   Hypokalemia Replaced   Acute blood loss anemia complicating  chronic normocytic anemia Appears to have chronic anemia with most recent hemoglobin at OSH in the 9 g range. Per orthopedics, hemoglobin drop may be due to oozing from the surgical site. Resumed home dose of ferrous sulfate , folate. This admission, hemoglobin dropped to a low of 6.9.  Orthopedics would like hemoglobin >9 for surgery on 10/22.   S/p 4 units PRBC transfusion thus far this admission, hemoglobin up to 9.5 > 9.2.  Continue to trend daily CBCs.   Thrombocytopenia Chronic and stable   Paroxysmal A-fib Chronic diastolic CHF HTN CAD HLD Currently either sinus rhythm or occasionally sinus bradycardia in the 50s.  Discontinued telemetry 10/21. Continue home dose of metoprolol  succinate 25 mg daily.  No anticoagulation-Eliquis  was stopped 10/9. Clinically euvolemic.  Holding home dose of torsemide  40 mg daily.  Reassess volume status daily. Blood pressure soft to controlled. Continue home dose of pravastatin . Follows with Dr. Rolan, cardiology.   COPD OSA Stable.  Patient indicates that he is not on oxygen or CPAP   History of DVT/PE Eliquis  was discontinued PTA on 10/9.   Cirrhosis Unclear if he has some degree of ascites versus abdominal protuberance from obesity. Overall appears compensated   Constipation Initiated bowel regimen.  Despite BM on 10/18.  Feels constipated.  Had a BM yesterday prior to enema.   Body mass index is 32.41 kg/m./Class I obesity Complicates care.  Outpatient follow-up.  Body mass index is 32.41 kg/m.   DVT prophylaxis: Place and maintain sequential compression device Start: 01/30/24 1714  Right lower extremity SCD.  Unfortunately unable to use chemical DVT prophylaxis due to  high risk for bleeding and acute blood loss anemia from left lower extremity wound/hematoma and also cannot use SCD on right leg.   Code Status: Full Code:  Family Communication: None at bedside. Disposition:  Status is: Inpatient Remains inpatient appropriate  because: Need for further inpatient surgery on left leg.     Consultants:   Orthopedics  Procedures:   As above  Subjective:  Patient reports that she did not need an enema and had a large volume BM yesterday.  Somewhat anxious about pain from upcoming surgery, reassured.  Denies any other complaints.  Objective:   Vitals:   01/31/24 1715 01/31/24 2003 02/01/24 0452 02/01/24 0751  BP: 101/62 (!) 106/56 103/67 110/79  Pulse: 68 71 72 64  Resp: 17 18 18 16   Temp: 98.7 F (37.1 C) 98.2 F (36.8 C) 97.8 F (36.6 C) 98 F (36.7 C)  TempSrc: Oral Oral Oral Oral  SpO2: 96% 97% 99% 98%  Weight:      Height:        General exam: Middle-age male, moderately built and obese lying comfortably propped up in bed without distress.  Oral mucosa moist. Respiratory system: Clear to auscultation. Respiratory effort normal. Cardiovascular system: S1 & S2 heard, RRR. No JVD, murmurs, rubs, gallops or clicks. No pedal edema.  Telemetry personally reviewed: Sinus rhythm.  DC'd telemetry. Gastrointestinal system: Abdomen is nondistended, soft and nontender. No organomegaly or masses felt. Normal bowel sounds heard. Central nervous system: Alert and oriented. No focal neurological deficits. Extremities: Symmetric 5 x 5 power.  Left leg with postop dressing/Ace wrapping, clean and dry.  Has wound VAC with dark bloody drainage. Skin: No rashes, lesions or ulcers Psychiatry: Judgement and insight appear normal. Mood & affect appropriate.     Data Reviewed:   I have personally reviewed following labs and imaging studies   CBC: Recent Labs  Lab 01/27/24 1256 01/28/24 0428 01/31/24 0715 01/31/24 2138 02/01/24 0456  WBC 5.6   < > 3.8* 5.2 4.8  NEUTROABS 4.4  --   --   --   --   HGB 9.3*   < > 8.2* 9.5* 9.2*  HCT 30.9*   < > 26.1* 30.3* 28.8*  MCV 84.4   < > 83.9 84.9 84.5  PLT 118*   < > 115* 138* 127*   < > = values in this interval not displayed.    Basic Metabolic Panel: Recent  Labs  Lab 01/27/24 1256 01/29/24 0950 01/30/24 0754 01/31/24 0715  NA 140 139 138 138  K 3.4* 4.0 4.0 3.6  CL 103 106 104 106  CO2 24 23 25 22   GLUCOSE 106* 95 86 111*  BUN 14 19 15 15   CREATININE 0.82 0.77 0.85 0.96  CALCIUM 8.7* 8.4* 8.4* 8.2*    Liver Function Tests: Recent Labs  Lab 01/27/24 1256 01/29/24 0950  AST 18 13*  ALT 12 9  ALKPHOS 96 65  BILITOT 0.8 0.4  PROT 6.2* 5.5*  ALBUMIN  2.9* 2.5*    CBG: No results for input(s): GLUCAP in the last 168 hours.  Microbiology Studies:   Recent Results (from the past 240 hours)  Surgical pcr screen     Status: None   Collection Time: 01/27/24  4:45 PM   Specimen: Nasal Mucosa; Nasal Swab  Result Value Ref Range Status   MRSA, PCR NEGATIVE NEGATIVE Final   Staphylococcus aureus NEGATIVE NEGATIVE Final    Comment: (NOTE) The Xpert SA Assay (FDA approved for NASAL specimens in  patients 84 years of age and older), is one component of a comprehensive surveillance program. It is not intended to diagnose infection nor to guide or monitor treatment. Performed at [facility], [address] [place], [place] [address]     Radiology Studies:  No results found.  Scheduled Meds:    sodium chloride    Intravenous Once   ascorbic acid  500 mg Oral Daily   ferrous sulfate   325 mg Oral Q breakfast   folic acid  1 mg Oral Daily   gabapentin   800 mg Oral TID   lactulose   20 g Oral BID   magnesium  oxide  400 mg Oral Daily   methocarbamol   500 mg Oral TID   metoprolol  succinate  25 mg Oral Daily   milk and molasses  1 enema Rectal Once   pantoprazole   40 mg Oral Daily   polyethylene glycol  17 g Oral BID   pravastatin   20 mg Oral Daily   senna  1 tablet Oral BID   sertraline   100 mg Oral QHS   sodium chloride  flush  3 mL Intravenous Q12H   tamsulosin   0.4 mg Oral q1800    Continuous Infusions:     LOS: 5 days     Trenda Mar, MD,  FACP, [facility], [facility], [facility]   [facility]  Physician Advisor [place]      To contact the attending provider between 7A-7P or the covering provider during after hours 7P-7A, please log into the web site [email or web address] and access using universal [place] password for that web site. If you do not have the password, please call the hospital operator.  02/01/2024, 2:10 PM

## 2024-02-01 NOTE — Progress Notes (Signed)
   Subjective:  Patient improved. Hg greater than 9 status post transfusion. Wound vac on first cannister, 350cc  Objective:   VITALS:   Vitals:   01/31/24 1715 01/31/24 2003 02/01/24 0452 02/01/24 0751  BP: 101/62 (!) 106/56 103/67 110/79  Pulse: 68 71 72 64  Resp: 17 18 18 16   Temp: 98.7 F (37.1 C) 98.2 F (36.8 C) 97.8 F (36.6 C) 98 F (36.7 C)  TempSrc: Oral Oral Oral Oral  SpO2: 96% 97% 99% 98%  Weight:      Height:        Left lower extremity wound VAC is clean dry intact holding good suction seal.  Remainder of distal nerve lower extremity exam remains intact.  Fires tib Wellsite geologist as well as Optician, dispensing Value Date   WBC 4.8 02/01/2024   HGB 9.2 (L) 02/01/2024   HCT 28.8 (L) 02/01/2024   MCV 84.5 02/01/2024   PLT 127 (L) 02/01/2024     Assessment/Plan:  5 Days Post-Op left lower extremity necrotic eschar debridement, please make NPO at midnight for surgical return with Dr. Harden 10/22  - Expected postop acute blood loss anemia - appropriate response to blood transfusion - Patient to work with PT to optimize mobilization safely as well as frequent bed turns given his limited mobility and habitus - DVT ppx - SCDs, ambulation, patient on baseline Eliquis  due to A-fib/DVT.  Would recommend Lovenox treatment given this as this can be quickly discontinued in preparation for future surgical debridements.  Recommend Lovenox dosing per pharmacy - No evidence of infection at this time - WBAT operative extremity - Pain control - multimodal pain management, ATC acetaminophen  in conjunction with as needed narcotic (oxycodone ), although this should be minimized with other modalities  - Discharge planning pending CM, appreciate coordination   Perri Aragones 02/01/2024, 11:31 AM

## 2024-02-01 NOTE — Progress Notes (Signed)
 Occupational Therapy Treatment Patient Details Name: Charles Gray MRN: [id number] DOB: [birth year] Today's Date: 02/01/2024   History of present illness Pt is a 68 y.o. male admitted 01/27/24 for left leg pressure ulcer from hematoma, necrotic including subcutaneous tissue down to fascia. Pt s/p I&D and wound vac application 10/16. PMHx: paroxysmal A-fib, COPD, cirrhosis, DVT/PE in 2021, chronic diastolic CHF, HTN, OSA, GERD, HLD, low back pain, anxiety, CAD, and iron  deficiency anemia. Of note, recent hospitalization to [facility] d/t LLE hematoma with extravasation.   OT comments  Pt progressing toward established OT goals slowly. Pt needing min A and significantly increased time as well as 2 rest breaks to come to EOB. Pt medicated prior to session, however, pt reports pain has been out of control. Introduced International aid/development worker for LB AE and pt grasping concept of use, but needing nearly max A for use to don sock on R foot secondary to BUE weakness. Able to scoot along EOB with mod A toward HOB on R. Will continue to follow.       If plan is discharge home, recommend the following:  Two people to help with walking and/or transfers;Two people to help with bathing/dressing/bathroom;Assistance with cooking/housework;Assist for transportation;Help with stairs or ramp for entrance   Equipment Recommendations  Other (comment) (defer)    Recommendations for Other Services      Precautions / Restrictions Precautions Precautions: Fall Recall of Precautions/Restrictions: Impaired Precaution/Restrictions Comments: Wound Vac LLE Restrictions Weight Bearing Restrictions Per Provider Order: Yes LLE Weight Bearing Per Provider Order: Weight bearing as tolerated       Mobility Bed Mobility Overal bed mobility: Needs Assistance Bed Mobility: Supine to Sit, Sit to Supine     Supine to sit: Min assist, HOB elevated, Used rails Sit to supine: Min assist, Used rails   General bed mobility comments: light  min A to manage LLE and to scoot fully out to EOB, pt needing significantly increased time to compelte due to pain    Transfers Overall transfer level: Needs assistance Equipment used: None Transfers: Bed to chair/wheelchair/BSC            Lateral/Scoot Transfers: Mod assist General transfer comment: simulated along EOB; cues for technique     Balance Overall balance assessment: Needs assistance Sitting-balance support: Bilateral upper extremity supported, Feet supported Sitting balance-Leahy Scale: Good Sitting balance - Comments: able to maintain without assist                                   ADL either performed or assessed with clinical judgement   ADL Overall ADL's : Needs assistance/impaired     Grooming: Set up;Sitting               Lower Body Dressing: Maximal assistance;With adaptive equipment Lower Body Dressing Details (indicate cue type and reason): intro sock aide; extremely limited by UB strength and pain to use sock aid                    Extremity/Trunk Assessment Upper Extremity Assessment Upper Extremity Assessment: RUE deficits/detail;LUE deficits/detail RUE Deficits / Details: longstanding limited shoulder from prior injury LUE Deficits / Details: per pt torn rotator cuff   Lower Extremity Assessment Lower Extremity Assessment: Defer to PT evaluation        Vision       Perception     Praxis     Communication Communication Communication: No apparent  difficulties   Cognition Arousal: Alert Behavior During Therapy: Flat affect Cognition: No apparent impairments             OT - Cognition Comments: intensely focused on his pain and various ailments                 Following commands: Intact        Cueing   Cueing Techniques: Verbal cues  Exercises      Shoulder Instructions       General Comments VSS on RA    Pertinent Vitals/ Pain       Pain Assessment Pain Assessment:  Faces Faces Pain Scale: Hurts whole lot Pain Location: LLE Pain Descriptors / Indicators: Grimacing, Moaning, Discomfort Pain Intervention(s): Monitored during session  Home Living                                          Prior Functioning/Environment              Frequency  Min 2X/week        Progress Toward Goals  OT Goals(current goals can now be found in the care plan section)  Progress towards OT goals: Progressing toward goals  Acute Rehab OT Goals OT Goal Formulation: With patient Time For Goal Achievement: 02/11/24 Potential to Achieve Goals: Good ADL Goals Pt Will Perform Grooming: with set-up;sitting Pt Will Perform Upper Body Dressing: with set-up;sitting Pt Will Transfer to Toilet: with min assist;stand pivot transfer;bedside commode Additional ADL Goal #1: Pt will complete bed mobility with moderate assistance in preparation for ADLs.  Plan      Co-evaluation                 AM-PAC OT 6 Clicks Daily Activity     Outcome Measure   Help from another person eating meals?: None Help from another person taking care of personal grooming?: A Little Help from another person toileting, which includes using toliet, bedpan, or urinal?: Total Help from another person bathing (including washing, rinsing, drying)?: A Lot Help from another person to put on and taking off regular upper body clothing?: A Little Help from another person to put on and taking off regular lower body clothing?: Total 6 Click Score: 14    End of Session    OT Visit Diagnosis: Muscle weakness (generalized) (M62.81);Pain Pain - Right/Left: Left Pain - part of body: Leg   Activity Tolerance Patient limited by pain   Patient Left in bed;with call bell/phone within reach;with bed alarm set   Nurse Communication Mobility status        Time: 9089-9044 OT Time Calculation (min): 45 min  Charges: OT General Charges $OT Visit: 1 Visit OT  Treatments $Self Care/Home Management : 23-37 mins $Therapeutic Activity: 8-22 mins  Charles Gray, OTD, OTR/L [facility] Acute Rehabilitation Office: [phone]   Charles Gray 02/01/2024, 1:55 PM

## 2024-02-01 NOTE — Plan of Care (Signed)
  Problem: Education: Goal: Knowledge of General Education information will improve Description: Including pain rating scale, medication(s)/side effects and non-pharmacologic comfort measures Outcome: Progressing   Problem: Clinical Measurements: Goal: Diagnostic test results will improve Outcome: Progressing   Problem: Nutrition: Goal: Adequate nutrition will be maintained Outcome: Progressing   Problem: Pain Managment: Goal: General experience of comfort will improve and/or be controlled Outcome: Progressing

## 2024-02-01 NOTE — Plan of Care (Signed)
  Problem: Education: Goal: Knowledge of General Education information will improve Description: Including pain rating scale, medication(s)/side effects and non-pharmacologic comfort measures Outcome: Progressing   Problem: Health Behavior/Discharge Planning: Goal: Ability to manage health-related needs will improve Outcome: Progressing   Problem: Nutrition: Goal: Adequate nutrition will be maintained Outcome: Progressing   Problem: Coping: Goal: Level of anxiety will decrease Outcome: Progressing   Problem: Elimination: Goal: Will not experience complications related to bowel motility Outcome: Progressing Goal: Will not experience complications related to urinary retention Outcome: Progressing   Problem: Pain Managment: Goal: General experience of comfort will improve and/or be controlled Outcome: Progressing   Problem: Safety: Goal: Ability to remain free from injury will improve Outcome: Progressing   Problem: Skin Integrity: Goal: Risk for impaired skin integrity will decrease Outcome: Progressing

## 2024-02-02 ENCOUNTER — Other Ambulatory Visit: Payer: Self-pay

## 2024-02-02 ENCOUNTER — Inpatient Hospital Stay (HOSPITAL_COMMUNITY): Payer: Self-pay | Admitting: Anesthesiology

## 2024-02-02 ENCOUNTER — Encounter (HOSPITAL_COMMUNITY)
Admission: EM | Disposition: A | Discharge status: Skilled Nursing Facility | Payer: Self-pay | Source: Skilled Nursing Facility | Attending: Internal Medicine

## 2024-02-02 DIAGNOSIS — S8012XA Contusion of left lower leg, initial encounter: Secondary | ICD-10-CM | POA: Diagnosis not present

## 2024-02-02 DIAGNOSIS — I872 Venous insufficiency (chronic) (peripheral): Secondary | ICD-10-CM

## 2024-02-02 DIAGNOSIS — S8012XD Contusion of left lower leg, subsequent encounter: Secondary | ICD-10-CM | POA: Diagnosis not present

## 2024-02-02 DIAGNOSIS — I890 Lymphedema, not elsewhere classified: Secondary | ICD-10-CM

## 2024-02-02 DIAGNOSIS — I5033 Acute on chronic diastolic (congestive) heart failure: Secondary | ICD-10-CM

## 2024-02-02 DIAGNOSIS — I110 Hypertensive heart disease with heart failure: Secondary | ICD-10-CM

## 2024-02-02 DIAGNOSIS — I4891 Unspecified atrial fibrillation: Secondary | ICD-10-CM

## 2024-02-02 HISTORY — PX: APPLICATION OF WOUND VAC: SHX5189

## 2024-02-02 HISTORY — PX: INCISION AND DRAINAGE OF DEEP ABSCESS, CALF: SHX7361

## 2024-02-02 LAB — BASIC METABOLIC PANEL WITH GFR
Anion gap: 8 (ref 5–15)
BUN: 11 mg/dL (ref 8–23)
CO2: 26 mmol/L (ref 22–32)
Calcium: 8.4 mg/dL — ABNORMAL LOW (ref 8.9–10.3)
Chloride: 104 mmol/L (ref 98–111)
Creatinine, Ser: 0.82 mg/dL (ref 0.61–1.24)
GFR, Estimated: >60 mL/min (ref >60)
Glucose, Bld: 109 mg/dL — ABNORMAL HIGH (ref 70–99)
Potassium: 3.5 mmol/L (ref 3.5–5.1)
Sodium: 138 mmol/L (ref 135–145)

## 2024-02-02 LAB — CBC
HCT: 28.7 % — ABNORMAL LOW (ref 39.0–52.0)
Hemoglobin: 9 g/dL — ABNORMAL LOW (ref 13.0–17.0)
MCH: 26.8 pg (ref 26.0–34.0)
MCHC: 31.4 g/dL (ref 30.0–36.0)
MCV: 85.4 fL (ref 80.0–100.0)
Platelets: 122 K/uL — ABNORMAL LOW (ref 150–400)
RBC: 3.36 MIL/uL — ABNORMAL LOW (ref 4.22–5.81)
RDW: 15.6 % — ABNORMAL HIGH (ref 11.5–15.5)
WBC: 4.6 K/uL (ref 4.0–10.5)
nRBC: 0 % (ref 0.0–0.2)

## 2024-02-02 SURGERY — INCISION AND DRAINAGE OF DEEP ABSCESS, CALF
Anesthesia: General | Site: Leg Lower | Laterality: Left

## 2024-02-02 MED ORDER — PROPOFOL 10 MG/ML IV BOLUS
INTRAVENOUS | Status: AC
Start: 2024-02-02 — End: 2024-02-02
  Filled 2024-02-02: qty 20

## 2024-02-02 MED ORDER — LIDOCAINE 2% (20 MG/ML) 5 ML SYRINGE
INTRAMUSCULAR | Status: AC
Start: 2024-02-02 — End: 2024-02-02
  Filled 2024-02-02: qty 5

## 2024-02-02 MED ORDER — ORAL CARE MOUTH RINSE: 15.0000 mL | Freq: Once | OROMUCOSAL | Status: AC

## 2024-02-02 MED ORDER — LIDOCAINE 2% (20 MG/ML) 5 ML SYRINGE
INTRAMUSCULAR | Status: DC | PRN
  Administered 2024-02-02: 40 mg via INTRAVENOUS

## 2024-02-02 MED ORDER — MIDAZOLAM HCL 2 MG/2ML IJ SOLN
INTRAMUSCULAR | Status: AC
Start: 2024-02-02 — End: 2024-02-02
  Filled 2024-02-02: qty 2

## 2024-02-02 MED ORDER — ROCURONIUM BROMIDE 10 MG/ML (PF) SYRINGE
PREFILLED_SYRINGE | INTRAVENOUS | Status: DC | PRN
  Administered 2024-02-02: 40 mg via INTRAVENOUS

## 2024-02-02 MED ORDER — PHENYLEPHRINE 80 MCG/ML (10ML) SYRINGE FOR IV PUSH (FOR BLOOD PRESSURE SUPPORT)
PREFILLED_SYRINGE | INTRAVENOUS | Status: AC
  Filled 2024-02-02: qty 10

## 2024-02-02 MED ORDER — CHLORHEXIDINE GLUCONATE 0.12 % MT SOLN
15.0000 mL | Freq: Once | OROMUCOSAL | Status: AC
  Administered 2024-02-02: 15 mL via OROMUCOSAL

## 2024-02-02 MED ORDER — ZOLPIDEM TARTRATE 5 MG PO TABS
5.0000 mg | ORAL_TABLET | Freq: Every evening | ORAL | Status: DC | PRN
  Administered 2024-02-02 – 2024-02-03 (×2): 5 mg via ORAL
  Filled 2024-02-02 (×2): qty 1

## 2024-02-02 MED ORDER — CHLORHEXIDINE GLUCONATE 4 % EX SOLN: 60.0000 mL | Freq: Once | CUTANEOUS | Status: DC

## 2024-02-02 MED ORDER — ONDANSETRON HCL 4 MG/2ML IJ SOLN
INTRAMUSCULAR | Status: DC | PRN
  Administered 2024-02-02: 4 mg via INTRAVENOUS

## 2024-02-02 MED ORDER — PROPOFOL 10 MG/ML IV BOLUS
INTRAVENOUS | Status: AC
  Filled 2024-02-02: qty 20

## 2024-02-02 MED ORDER — MEPERIDINE HCL 25 MG/ML IJ SOLN: 6.2500 mg | INTRAMUSCULAR | Status: DC | PRN

## 2024-02-02 MED ORDER — ROCURONIUM BROMIDE 10 MG/ML (PF) SYRINGE
PREFILLED_SYRINGE | INTRAVENOUS | Status: AC
Start: 2024-02-02 — End: 2024-02-02
  Filled 2024-02-02: qty 10

## 2024-02-02 MED ORDER — ONDANSETRON HCL 4 MG/2ML IJ SOLN
INTRAMUSCULAR | Status: AC
  Filled 2024-02-02: qty 2

## 2024-02-02 MED ORDER — LACTATED RINGERS IV SOLN: INTRAVENOUS | Status: DC

## 2024-02-02 MED ORDER — EPHEDRINE SULFATE-NACL 50-0.9 MG/10ML-% IV SOSY
PREFILLED_SYRINGE | INTRAVENOUS | Status: DC | PRN
  Administered 2024-02-02 (×2): 10 mg via INTRAVENOUS

## 2024-02-02 MED ORDER — ALBUMIN HUMAN 5 % IV SOLN
INTRAVENOUS | Status: AC
Start: 2024-02-02 — End: 2024-02-02
  Filled 2024-02-02: qty 500

## 2024-02-02 MED ORDER — FENTANYL CITRATE (PF) 250 MCG/5ML IJ SOLN
INTRAMUSCULAR | Status: DC | PRN
  Administered 2024-02-02 (×4): 50 ug via INTRAVENOUS

## 2024-02-02 MED ORDER — CEFAZOLIN SODIUM-DEXTROSE 3-4 GM/150ML-% IV SOLN
3.0000 g | INTRAVENOUS | Status: AC
  Administered 2024-02-02: 3 g via INTRAVENOUS

## 2024-02-02 MED ORDER — OXYCODONE HCL 5 MG PO TABS: 5.0000 mg | ORAL_TABLET | Freq: Once | ORAL | Status: DC | PRN

## 2024-02-02 MED ORDER — FENTANYL CITRATE (PF) 100 MCG/2ML IJ SOLN
25.0000 ug | INTRAMUSCULAR | Status: DC | PRN
  Administered 2024-02-02 (×3): 50 ug via INTRAVENOUS

## 2024-02-02 MED ORDER — SODIUM CHLORIDE 0.9 % IR SOLN
Status: DC | PRN
  Administered 2024-02-02: 1000 mL

## 2024-02-02 MED ORDER — OXYCODONE HCL 5 MG/5ML PO SOLN: 5.0000 mg | Freq: Once | ORAL | Status: DC | PRN

## 2024-02-02 MED ORDER — SUGAMMADEX SODIUM 200 MG/2ML IV SOLN
INTRAVENOUS | Status: DC | PRN
  Administered 2024-02-02 (×2): 100 mg via INTRAVENOUS

## 2024-02-02 MED ORDER — OXYCODONE HCL 5 MG PO TABS
ORAL_TABLET | ORAL | Status: AC
  Filled 2024-02-02: qty 3

## 2024-02-02 MED ORDER — PROPOFOL 10 MG/ML IV BOLUS
INTRAVENOUS | Status: DC | PRN
  Administered 2024-02-02: 50 mg via INTRAVENOUS

## 2024-02-02 MED ORDER — DEXMEDETOMIDINE HCL IN NACL 80 MCG/20ML IV SOLN
INTRAVENOUS | Status: DC | PRN
  Administered 2024-02-02 (×2): 8 ug via INTRAVENOUS

## 2024-02-02 MED ORDER — VASHE WOUND IRRIGATION OPTIME
TOPICAL | Status: DC | PRN
  Administered 2024-02-02: 34 [oz_av]

## 2024-02-02 MED ORDER — ONDANSETRON HCL 4 MG/2ML IJ SOLN: 4.0000 mg | Freq: Once | INTRAMUSCULAR | Status: DC | PRN

## 2024-02-02 MED ORDER — 0.9 % SODIUM CHLORIDE (POUR BTL) OPTIME
TOPICAL | Status: DC | PRN
  Administered 2024-02-02: 1000 mL

## 2024-02-02 MED ORDER — MIDAZOLAM HCL (PF) 2 MG/2ML IJ SOLN
INTRAMUSCULAR | Status: DC | PRN
  Administered 2024-02-02: 2 mg via INTRAVENOUS

## 2024-02-02 MED ORDER — PHENYLEPHRINE HCL-NACL 20-0.9 MG/250ML-% IV SOLN
INTRAVENOUS | Status: DC | PRN
  Administered 2024-02-02: 20 ug/min via INTRAVENOUS

## 2024-02-02 MED ORDER — LIDOCAINE 2% (20 MG/ML) 5 ML SYRINGE
INTRAMUSCULAR | Status: AC
  Filled 2024-02-02: qty 5

## 2024-02-02 MED ORDER — DEXAMETHASONE SOD PHOSPHATE PF 10 MG/ML IJ SOLN
INTRAMUSCULAR | Status: DC | PRN
  Administered 2024-02-02: 10 mg via INTRAVENOUS

## 2024-02-02 MED ORDER — POVIDONE-IODINE 10 % EX SWAB
2.0000 | Freq: Once | CUTANEOUS | Status: AC
Start: 2024-02-02 — End: 2024-02-02
  Administered 2024-02-02: 2 via TOPICAL

## 2024-02-02 MED ORDER — ALBUMIN HUMAN 5 % IV SOLN
12.5000 g | Freq: Once | INTRAVENOUS | Status: AC
  Administered 2024-02-02: 12.5 g via INTRAVENOUS

## 2024-02-02 MED ORDER — CEFAZOLIN SODIUM-DEXTROSE 1-4 GM/50ML-% IV SOLN
1.0000 g | Freq: Three times a day (TID) | INTRAVENOUS | Status: AC
  Administered 2024-02-02 – 2024-02-03 (×3): 1 g via INTRAVENOUS
  Filled 2024-02-02 (×3): qty 50

## 2024-02-02 MED ORDER — FENTANYL CITRATE (PF) 100 MCG/2ML IJ SOLN
INTRAMUSCULAR | Status: AC
  Filled 2024-02-02: qty 2

## 2024-02-02 MED ORDER — EPHEDRINE 5 MG/ML INJ
INTRAVENOUS | Status: AC
  Filled 2024-02-02: qty 5

## 2024-02-02 MED ORDER — FENTANYL CITRATE (PF) 250 MCG/5ML IJ SOLN
INTRAMUSCULAR | Status: AC
  Filled 2024-02-02: qty 5

## 2024-02-02 MED ORDER — PHENYLEPHRINE 80 MCG/ML (10ML) SYRINGE FOR IV PUSH (FOR BLOOD PRESSURE SUPPORT)
PREFILLED_SYRINGE | INTRAVENOUS | Status: DC | PRN
  Administered 2024-02-02: 160 ug via INTRAVENOUS
  Administered 2024-02-02: 80 ug via INTRAVENOUS
  Administered 2024-02-02 (×3): 160 ug via INTRAVENOUS
  Administered 2024-02-02: 80 ug via INTRAVENOUS

## 2024-02-02 SURGICAL SUPPLY — 43 items
BAG COUNTER SPONGE SURGICOUNT (BAG) IMPLANT
BLADE SURG 21 STRL SS (BLADE) ×2 IMPLANT
BNDG COHESIVE 4X5 TAN STRL LF (GAUZE/BANDAGES/DRESSINGS) IMPLANT
BNDG COHESIVE 6X5 TAN NS LF (GAUZE/BANDAGES/DRESSINGS) IMPLANT
BNDG COHESIVE 6X5 TAN ST LF (GAUZE/BANDAGES/DRESSINGS) IMPLANT
BNDG GAUZE DERMACEA FLUFF 4 (GAUZE/BANDAGES/DRESSINGS) IMPLANT
CANISTER WOUND CARE 500ML ATS (WOUND CARE) IMPLANT
CANISTER WOUNDNEG PRESSURE 500 (CANNISTER) IMPLANT
CLEANSER WND VASHE 34 (WOUND CARE) IMPLANT
CLEANSER WND VASHE INSTL 34OZ (WOUND CARE) IMPLANT
COVER SURGICAL LIGHT HANDLE (MISCELLANEOUS) ×4 IMPLANT
DRAPE DERMATAC (DRAPES) IMPLANT
DRAPE INCISE IOBAN 66X45 STRL (DRAPES) IMPLANT
DRAPE U-SHAPE 47X51 STRL (DRAPES) ×2 IMPLANT
DRESSING PREVENA PLUS CUSTOM (GAUZE/BANDAGES/DRESSINGS) IMPLANT
DRESSING VERAFLO CLEANS CC MED (GAUZE/BANDAGES/DRESSINGS) IMPLANT
DRSG ADAPTIC 3X8 NADH LF (GAUZE/BANDAGES/DRESSINGS) ×2 IMPLANT
DRSG VAC PEEL AND PLACE LRG (GAUZE/BANDAGES/DRESSINGS) IMPLANT
DURAPREP 26ML APPLICATOR (WOUND CARE) ×2 IMPLANT
ELECTRODE REM PT RTRN 9FT ADLT (ELECTROSURGICAL) IMPLANT
GAUZE PAD ABD 8X10 STRL (GAUZE/BANDAGES/DRESSINGS) IMPLANT
GAUZE SPONGE 4X4 12PLY STRL (GAUZE/BANDAGES/DRESSINGS) IMPLANT
GLOVE BIOGEL PI IND STRL 9 (GLOVE) ×2 IMPLANT
GLOVE SURG ORTHO 9.0 STRL STRW (GLOVE) ×2 IMPLANT
GOWN STRL REUS W/ TWL XL LVL3 (GOWN DISPOSABLE) ×4 IMPLANT
GRAFT SKIN WND SURGICLOSE M95 (Tissue) IMPLANT
KIT BASIN OR (CUSTOM PROCEDURE TRAY) ×2 IMPLANT
KIT TURNOVER KIT B (KITS) ×2 IMPLANT
MANIFOLD NEPTUNE II (INSTRUMENTS) ×2 IMPLANT
PACK ORTHO EXTREMITY (CUSTOM PROCEDURE TRAY) ×2 IMPLANT
PAD ARMBOARD POSITIONER FOAM (MISCELLANEOUS) ×4 IMPLANT
PAD NEG PRESSURE SENSATRAC (MISCELLANEOUS) IMPLANT
PENCIL BUTTON HOLSTER BLD 10FT (ELECTRODE) IMPLANT
SET HNDPC FAN SPRY TIP SCT (DISPOSABLE) IMPLANT
SOLN 0.9% NACL POUR BTL 1000ML (IV SOLUTION) ×2 IMPLANT
SPONGE T-LAP 18X18 ~~LOC~~+RFID (SPONGE) IMPLANT
STOCKINETTE IMPERVIOUS 9X36 MD (GAUZE/BANDAGES/DRESSINGS) IMPLANT
SUT ETHILON 2 0 PSLX (SUTURE) ×2 IMPLANT
SWAB COLLECTION DEVICE MRSA (MISCELLANEOUS) ×2 IMPLANT
SWAB CULTURE ESWAB REG 1ML (MISCELLANEOUS) IMPLANT
TOWEL GREEN STERILE (TOWEL DISPOSABLE) ×2 IMPLANT
TUBE CONNECTING 12X1/4 (SUCTIONS) ×2 IMPLANT
YANKAUER SUCT BULB TIP NO VENT (SUCTIONS) ×2 IMPLANT

## 2024-02-02 NOTE — Anesthesia Preprocedure Evaluation (Signed)
 Anesthesia Evaluation  Patient identified by MRN, date of birth, ID band Patient awake    Reviewed: Allergy & Precautions, NPO status , Patient's Chart, lab work & pertinent test results  Airway Mallampati: III  TM Distance: >3 FB Neck ROM: Full    Dental   Pulmonary sleep apnea , COPD   breath sounds clear to auscultation       Cardiovascular hypertension, Pt. on medications and Pt. on home beta blockers +CHF and + DVT (Hx PE on chronic anticoagulation - Eliquis - discontinued 2/2 spontaneous left leg wound)  + dysrhythmias Atrial Fibrillation  Rhythm:Irregular Rate:Normal  02/2022 Echo: 1. Left ventricular ejection fraction, by estimation, is 55 to 60%. The  left ventricle has normal function. The left ventricle has no regional  wall motion abnormalities. The left ventricular internal cavity size was  not well visualized. Left ventricular   diastolic parameters are indeterminate.   2. Right ventricular systolic function was not well visualized. The right  ventricular size is not well visualized. There is normal pulmonary artery  systolic pressure.   3. The mitral valve was not well visualized. No evidence of mitral valve  regurgitation.   4. The aortic valve was not well visualized. Aortic valve regurgitation  is not visualized. No aortic stenosis is present.   5. Aortic dilatation noted. There is mild dilatation of the aortic root,  measuring 40 mm.   6. Technically difficult study.      Neuro/Psych negative neurological ROS     GI/Hepatic ,GERD  ,,(+) Cirrhosis         Endo/Other    Renal/GU Renal disease     Musculoskeletal   Abdominal   Peds  Hematology  (+) Blood dyscrasia, anemia   Anesthesia Other Findings   Reproductive/Obstetrics                              Anesthesia Physical Anesthesia Plan  ASA: 3  Anesthesia Plan: General   Post-op Pain Management: Ofirmev  IV  (intra-op)*   Induction: Intravenous  PONV Risk Score and Plan: 2 and Dexamethasone and Ondansetron   Airway Management Planned: Oral ETT and LMA  Additional Equipment: None  Intra-op Plan:   Post-operative Plan: Extubation in OR  Informed Consent: I have reviewed the patients History and Physical, chart, labs and discussed the procedure including the risks, benefits and alternatives for the proposed anesthesia with the patient or authorized representative who has indicated his/her understanding and acceptance.     Dental advisory given  Plan Discussed with: CRNA and Anesthesiologist  Anesthesia Plan Comments:          Anesthesia Quick Evaluation

## 2024-02-02 NOTE — Transfer of Care (Signed)
 Immediate Anesthesia Transfer of Care Note  Patient: Charles Gray  Procedure(s) Performed: INCISION AND DRAINAGE OF DEEP ABSCESS, CALF (Left) APPLICATION, WOUND VAC (Left: Leg Lower)  Patient Location: PACU  Anesthesia Type:General  Level of Consciousness: drowsy  Airway & Oxygen Therapy: Patient Spontanous Breathing and Patient connected to face mask oxygen  Post-op Assessment: Report given to RN and Post -op Vital signs reviewed and stable  Post vital signs: Reviewed and stable  Last Vitals:  Vitals Value Taken Time  BP 100/51   Temp 97.6   Pulse 72 02/02/24 14:46  Resp 12 02/02/24 14:46  SpO2 98 % 02/02/24 14:46  Vitals shown include unfiled device data.  Last Pain:  Vitals:   02/02/24 1201  TempSrc:   PainSc: 8       Patients Stated Pain Goal: 3 (02/01/24 2022)  Complications: No notable events documented.

## 2024-02-02 NOTE — Op Note (Signed)
 02/02/2024  2:46 PM  PATIENT:  Charles Gray    PRE-OPERATIVE DIAGNOSIS:  LEFT LEG HEMATOMA  POST-OPERATIVE DIAGNOSIS:  Same  PROCEDURE: Excisional debridement skin and soft tissue muscle and fascia left leg. Application Kerecis micro graft 95 cm times  SURGEON:  Jerona LULLA Sage, MD  PHYSICIAN ASSISTANT:None ANESTHESIA:   General  PREOPERATIVE INDICATIONS:  [name] is a  68 y.o. male with a diagnosis of LEFT LEG HEMATOMA who failed conservative measures and elected for surgical management.    The risks benefits and alternatives were discussed with the patient preoperatively including but not limited to the risks of infection, bleeding, nerve injury, cardiopulmonary complications, the need for revision surgery, among others, and the patient was willing to proceed.  OPERATIVE IMPLANTS:   Implant Name Type Inv. Item Serial No. Manufacturer Lot No. LRB No. Used Action  GRAFT SKIN WND SURGICLOSE M95 - [id number] Tissue GRAFT SKIN WND SURGICLOSE M95  [employer] [id number] Left 1 Implanted  GRAFT SKIN WND SURGICLOSE M95 - [id number] Tissue GRAFT SKIN WND SURGICLOSE M95  [employer] [id number] Left 1 Implanted    @ENCIMAGES @  OPERATIVE FINDINGS: To cover a wound surface area greater than 400 cm.  OPERATIVE PROCEDURE: Patient was brought the operating room and underwent a general anesthetic.  After adequate levels anesthesia were obtained patient's left lower extremity was prepped using DuraPrep draped into a sterile field a timeout was called.  Patient had progressive ischemic changes around the skin edges and this was excised with a 21 blade knife.  There was further nonviable tissue on the wound and skin soft tissue muscle and fascia was excised.  After debridement the wound was 30 x 15 cm.  There was good healthy granulation tissue.  95 cm of Kerecis micro graft x 2 was applied to the wound bed.  This was covered with the cleanse choice sponges and covered with the peel in  place sponges.  This had a good suction fit patient was extubated taken the PACU in stable condition.   DISCHARGE PLANNING:  Antibiotic duration: 24 hours of antibiotics after surgery  Weightbearing: Weightbearing as tolerated  Pain medication: Opioid pathway  Dressing care/ Wound VAC: Continue wound VAC for 1 week  Ambulatory devices: Walker  Discharge to: Discharge planning based on therapy recommendations  Follow-up: In the office 1 week post operative.

## 2024-02-02 NOTE — Interval H&P Note (Signed)
 History and Physical Interval Note:  02/02/2024 6:38 AM  Charles Gray  has presented today for surgery, with the diagnosis of LEFT LEG HEMATOMA.  The various methods of treatment have been discussed with the patient and family. After consideration of risks, benefits and other options for treatment, the patient has consented to  Procedure(s) with comments: INCISION AND DRAINAGE OF DEEP ABSCESS, CALF (Left) - LEFT LEG DEBRIDEMENT as a surgical intervention.  The patient's history has been reviewed, patient examined, no change in status, stable for surgery.  I have reviewed the patient's chart and labs.  Questions were answered to the patient's satisfaction.     [name]

## 2024-02-02 NOTE — Progress Notes (Signed)
 PROGRESS NOTE  Charles Gray FMW:[id number] DOB: 1955-08-07 DOA: 01/27/2024 PCP: Sabas Norleen PARAS., MD   LOS: 6 days   Brief Narrative / Interim history: 68 year old male currently living in an SNF, PAF, COPD, cirrhosis, DVT/PE in 2021, chronic diastolic CHF, HTN, OSA, HLD, CAD who comes into the hospital with left lower extremity hematoma.  He was recently hospitalized to [facility] with spontaneous left lower extremity hematoma, active extravasation on CT angiogram.  IR and surgery were consulted over there but he did not appear to have underwent any specific interventions.  His anticoagulation was reversed and he was eventually discharged to SNF.  During dressing change at the SNF he has had more bleeding prompting his return to the hospital.  On 10/16 he underwent I&D of subcutaneous tissue including fascia wound with placement of a wound VAC.  He will have an additional I&D 10/22  Subjective / 24h Interval events: He is awaiting surgery this morning, denies any chest pain, denies any shortness of breath.  Assesement and Plan: Principal problem Left leg hematoma, necrotic skin and soft tissue of the left leg -appreciate orthopedic surgery follow-up.  Underwent an I&D on 10/16, he will be taken to the OR again today with Dr. Harden.  Will follow operative report  Active problems Hypokalemia-replenished, continue to monitor  Acute blood loss anemia, underlying chronic normocytic anemia-received total of 4 units of packed red blood cells, hemoglobin is above 9  Thrombocytopenia-chronic, stable  PAF-in sinus, continue metoprolol , hold anticoagulation  Essential hypertension-continue metoprolol   BPH-continue tamsulosin   COPD, OSA-stable, not on CPAP  History of DVT/PE-off Eliquis  now  Liver cirrhosis-noted  Hyperlipidemia-continue statin  Depression-continue sertraline   Obesity, class I-BMI 32  Scheduled Meds:  sodium chloride    Intravenous Once   ascorbic acid  500 mg  Oral Daily   ferrous sulfate   325 mg Oral Q breakfast   folic acid  1 mg Oral Daily   gabapentin   800 mg Oral TID   lactulose   20 g Oral BID   magnesium  oxide  400 mg Oral Daily   methocarbamol   500 mg Oral TID   metoprolol  succinate  25 mg Oral Daily   pantoprazole   40 mg Oral Daily   polyethylene glycol  17 g Oral BID   pravastatin   20 mg Oral Daily   senna  1 tablet Oral BID   sertraline   100 mg Oral QHS   sodium chloride  flush  3 mL Intravenous Q12H   tamsulosin   0.4 mg Oral q1800   Continuous Infusions: PRN Meds:.acetaminophen  **OR** acetaminophen , albuterol , HYDROmorphone  (DILAUDID ) injection, ondansetron  (ZOFRAN ) IV, oxyCODONE   Current Outpatient Medications  Medication Instructions   acetaminophen  (TYLENOL ) 1,000 mg, Oral, Every 6 hours, x6 days (01/21/24-01/27/24)    apixaban  (ELIQUIS ) 5 mg, Oral, 2 times daily   ascorbic acid (VITAMIN C) 500 mg, Oral, Daily   ferrous sulfate  325 mg, Oral, Daily with breakfast   folic acid (FOLVITE) 1 mg, Oral, Daily   gabapentin  (NEURONTIN ) 800 mg, Oral, 3 times daily   lactulose  (encephalopathy) (CHRONULAC ) 20 g, Oral, 2 times daily   magnesium  oxide (MAG-OX) 400 mg, Oral, Daily   methocarbamol  (ROBAXIN ) 500 mg, Oral, 3 times daily   methocarbamol  (ROBAXIN ) 750 mg, Oral, 3 times daily   metoprolol  succinate (TOPROL -XL) 25 mg, Oral, Daily at bedtime   naloxone  (NARCAN ) 0.4 mg, Nasal, See admin instructions, Administer 1 spray (0.4mg ), alternating nostrils ever 65 minutes as needed for unresponsiveness. Call MD and EMS if given.   omeprazole (PRILOSEC) 20  mg, Oral, Daily before breakfast   ondansetron  (ZOFRAN ) 8 mg, Oral, Every 8 hours PRN   oxyCODONE  (ROXICODONE ) 15 mg, Oral, Every 4 hours PRN   polyethylene glycol powder (GLYCOLAX /MIRALAX ) 17 g, Oral, Daily PRN   potassium chloride  SA (KLOR-CON  M) 20 MEQ tablet 20 mEq, Oral, 3 times daily   pravastatin  (PRAVACHOL ) 20 mg, Oral, Daily   sennosides-docusate sodium  (SENOKOT-S) 8.6-50 MG  tablet 2 tablets, Oral, 2 times daily   sertraline  (ZOLOFT ) 100 mg, Oral, Daily   tamsulosin  (FLOMAX ) 0.4 mg, Oral, Daily, Additional refills will need to come from PCP   torsemide  (DEMADEX ) 40 mg, Oral, Daily   VENTOLIN  HFA 108 (90 Base) MCG/ACT inhaler 1 puff, Inhalation, Every 4 hours PRN   zolpidem  (AMBIEN ) 10 mg, Oral, At bedtime PRN    Diet Orders (From admission, onward)     Start     Ordered   02/02/24 0919  Diet NPO time specified  Diet effective now        02/02/24 9081            DVT prophylaxis: Place and maintain sequential compression device Start: 01/30/24 1714   Lab Results  Component Value Date   PLT 122 (L) 02/02/2024      Code Status: Full Code  Family Communication: No family at bedside  Status is: Inpatient Remains inpatient appropriate because: Severity of illness, OR today   Level of care: Med-Surg  Consultants:  Orthopedic surgery  Objective: Vitals:   02/01/24 1932 02/02/24 0453 02/02/24 0700 02/02/24 0834  BP: (!) 100/54 (!) 100/59  (!) 99/57  Pulse: 65 69  65  Resp: 16 17  18   Temp: (!) 97.5 F (36.4 C) 98 F (36.7 C) (P) 98.5 F (36.9 C) 98.5 F (36.9 C)  TempSrc: Oral Oral  Oral  SpO2: 97% 98%  98%  Weight:      Height:        Intake/Output Summary (Last 24 hours) at 02/02/2024 1126 Last data filed at 02/02/2024 0219 Gross per 24 hour  Intake 480 ml  Output 2125 ml  Net -1645 ml   Wt Readings from Last 3 Encounters:  01/27/24 (!) 140.6 kg  10/24/23 (!) 138.3 kg  10/19/23 (!) 139.3 kg    Examination:  Constitutional: NAD Eyes: no scleral icterus ENMT: Mucous membranes are moist.  Neck: normal, supple Respiratory: clear to auscultation bilaterally, no wheezing, no crackles.  Cardiovascular: Regular rate and rhythm, no murmurs / rubs / gallops.  Abdomen: non distended, no tenderness. Bowel sounds positive.  Musculoskeletal: no clubbing / cyanosis.   Data Reviewed: I have independently reviewed following labs  and imaging studies   CBC Recent Labs  Lab 01/27/24 1256 01/28/24 0428 01/30/24 0754 01/30/24 2219 01/31/24 0715 01/31/24 2138 02/01/24 0456 02/02/24 0550  WBC 5.6   < > 4.5  --  3.8* 5.2 4.8 4.6  HGB 9.3*   < > 7.4* 8.2* 8.2* 9.5* 9.2* 9.0*  HCT 30.9*   < > 24.3* 26.1* 26.1* 30.3* 28.8* 28.7*  PLT 118*   < > 121*  --  115* 138* 127* 122*  MCV 84.4   < > 84.1  --  83.9 84.9 84.5 85.4  MCH 25.4*   < > 25.6*  --  26.4 26.6 27.0 26.8  MCHC 30.1   < > 30.5  --  31.4 31.4 31.9 31.4  RDW 16.7*   < > 16.1*  --  15.7* 15.7* 15.7* 15.6*  LYMPHSABS 0.6*  --   --   --   --   --   --   --  MONOABS 0.3  --   --   --   --   --   --   --   EOSABS 0.2  --   --   --   --   --   --   --   BASOSABS 0.0  --   --   --   --   --   --   --    < > = values in this interval not displayed.    Recent Labs  Lab 01/27/24 1256 01/29/24 0950 01/30/24 0754 01/31/24 0715 02/02/24 0550  NA 140 139 138 138 138  K 3.4* 4.0 4.0 3.6 3.5  CL 103 106 104 106 104  CO2 24 23 25 22 26   GLUCOSE 106* 95 86 111* 109*  BUN 14 19 15 15 11   CREATININE 0.82 0.77 0.85 0.96 0.82  CALCIUM 8.7* 8.4* 8.4* 8.2* 8.4*  AST 18 13*  --   --   --   ALT 12 9  --   --   --   ALKPHOS 96 65  --   --   --   BILITOT 0.8 0.4  --   --   --   ALBUMIN  2.9* 2.5*  --   --   --   INR 1.0  --   --   --   --     ------------------------------------------------------------------------------------------------------------------ No results for input(s): CHOL, HDL, LDLCALC, TRIG, CHOLHDL, LDLDIRECT in the last 72 hours.  Lab Results  Component Value Date   HGBA1C 5.2 10/27/2023   ------------------------------------------------------------------------------------------------------------------ No results for input(s): TSH, T4TOTAL, T3FREE, THYROIDAB in the last 72 hours.  Invalid input(s): FREET3  Cardiac Enzymes No results for input(s): CKMB, TROPONINI, MYOGLOBIN in the last 168 hours.  Invalid  input(s): CK ------------------------------------------------------------------------------------------------------------------    Component Value Date/Time   BNP 33.7 05/21/2023 1438    CBG: No results for input(s): GLUCAP in the last 168 hours.  Recent Results (from the past 240 hours)  Surgical pcr screen     Status: None   Collection Time: 01/27/24  4:45 PM   Specimen: Nasal Mucosa; Nasal Swab  Result Value Ref Range Status   MRSA, PCR NEGATIVE NEGATIVE Final   Staphylococcus aureus NEGATIVE NEGATIVE Final    Comment: (NOTE) The Xpert SA Assay (FDA approved for NASAL specimens in patients 41 years of age and older), is one component of a comprehensive surveillance program. It is not intended to diagnose infection nor to guide or monitor treatment. Performed at [facility], [address], [place], [place] [address]      Radiology Studies: No results found.   Nilda Fendt, MD, PhD [facility]  Between 7 am - 7 pm I am available, please contact me via Amion (for emergencies) or Securechat (non urgent messages)  Between 7 pm - 7 am I am not available, please contact night coverage MD/APP via Amion

## 2024-02-02 NOTE — Progress Notes (Addendum)
 Pt paged out multiple times for concerns that his wound vac is not draining after RN performed seal check and reassured pt it is intact and due to new vac placement that area may not be draining as much as the previous VAC. Pt continued to call out and started saying he now cannot breathe. Pt seems apprehensive by constantly repeating but its not draining and it was before. Oxygen saturations 98% on RA. After RN informed pt of o2 sats, pt then states my stomach is starting to swell. RN called rapid response to assess pt

## 2024-02-02 NOTE — Anesthesia Procedure Notes (Signed)
 Procedure Name: Intubation Date/Time: 02/02/2024 2:03 PM  Performed by: Atanacio Arland HERO, CRNAPre-anesthesia Checklist: Patient identified, Emergency Drugs available, Suction available and Patient being monitored Patient Re-evaluated:Patient Re-evaluated prior to induction Oxygen Delivery Method: Circle System Utilized Preoxygenation: Pre-oxygenation with 100% oxygen Induction Type: IV induction Ventilation: Mask ventilation without difficulty Laryngoscope Size: Mac and 4 Grade View: Grade I Tube type: Oral Tube size: 8.0 mm Number of attempts: 1 Airway Equipment and Method: Stylet Placement Confirmation: ETT inserted through vocal cords under direct vision, positive ETCO2 and breath sounds checked- equal and bilateral Secured at: 24 cm Tube secured with: Tape Dental Injury: Teeth and Oropharynx as per pre-operative assessment

## 2024-02-03 ENCOUNTER — Encounter (HOSPITAL_COMMUNITY): Payer: Self-pay | Admitting: Orthopedic Surgery

## 2024-02-03 DIAGNOSIS — S8012XD Contusion of left lower leg, subsequent encounter: Secondary | ICD-10-CM | POA: Diagnosis not present

## 2024-02-03 MED ORDER — APIXABAN 5 MG PO TABS: 5.0000 mg | ORAL_TABLET | Freq: Two times a day (BID) | ORAL | Status: DC

## 2024-02-03 MED ORDER — APIXABAN 2.5 MG PO TABS: 2.5000 mg | ORAL_TABLET | Freq: Two times a day (BID) | ORAL | Status: DC

## 2024-02-03 NOTE — Anesthesia Postprocedure Evaluation (Signed)
 Anesthesia Post Note  Patient: Personal assistant  Procedure(s) Performed: INCISION AND DRAINAGE OF DEEP ABSCESS, CALF (Left) APPLICATION, WOUND VAC (Left: Leg Lower)     Patient location during evaluation: PACU Anesthesia Type: General Level of consciousness: awake and alert Pain management: pain level controlled Vital Signs Assessment: post-procedure vital signs reviewed and stable Respiratory status: spontaneous breathing, nonlabored ventilation, respiratory function stable and patient connected to nasal cannula oxygen Cardiovascular status: blood pressure returned to baseline and stable Postop Assessment: no apparent nausea or vomiting Anesthetic complications: no   No notable events documented.               Traye Bates D Tallie Dodds

## 2024-02-03 NOTE — Progress Notes (Signed)
 Patient ID: Charles Gray, male   DOB: 09-12-1955, 68 y.o.   MRN: [id number] Patient is postoperative day 1 repeat debridement hematoma left leg.  The tissue margins and deep tissue have healthy granulation tissue there is 100 cc in the wound VAC canister.  Patient will need discharge back to skilled nursing and plan for discharge with the Prevena plus portable wound VAC pump.  Patient will not need additional antibiotics at time of discharge.

## 2024-02-03 NOTE — TOC Progression Note (Signed)
 Transition of Care [facility]) - Progression Note    Patient Details  Name: Issam Carlyon MRN: [id number] Date of Birth: 09-11-1955  Transition of Care [facility]) CM/SW Contact  Bridget Cordella Simmonds, LCSW Phone Number: 02/03/2024, 12:26 PM  Clinical Narrative:   CSW confirmed with Sherri/[facility] that they can receive pt tomorrow. Questions about preveena would vac answered.  CSW updated pt as well.      Expected Discharge Plan: Skilled Nursing Facility (From [facility]) Barriers to Discharge: Continued Medical Work up               Expected Discharge Plan and Services In-house Referral: Clinical Social Work     Living arrangements for the past 2 months: Apartment                                       Social Drivers of Health (SDOH) Interventions SDOH Screenings   Food Insecurity: No Food Insecurity (01/28/2024)  Housing: Low Risk  (01/28/2024)  Transportation Needs: Unmet Transportation Needs (01/28/2024)  Utilities: Not At Risk (01/28/2024)  Alcohol Screen: Low Risk  (06/11/2020)  Depression (PHQ2-9): Medium Risk (06/11/2020)  Financial Resource Strain: Medium Risk (11/15/2020)  Social Connections: Socially Integrated (01/28/2024)  Tobacco Use: Low Risk  (01/27/2024)    Readmission Risk Interventions    01/29/2024    2:23 PM  Readmission Risk Prevention Plan  Transportation Screening Complete  PCP or Specialist Appt within 3-5 Days Complete  HRI or Home Care Consult Complete  Social Work Consult for Recovery Care Planning/Counseling Complete  Palliative Care Screening Not Applicable  Medication Review Oceanographer) Complete

## 2024-02-03 NOTE — Progress Notes (Signed)
 CSW met with pt regarding SDOH: transportation. Pt currently at SNF since August 2025, will DC to SNF from current inpt stay.  Pt reports he does maintain his apartment in [place] [facility]).  Transportation discussed, pt reports he has received transportation services from [facility] in the past.  Pt currently with traditional medicare.  Pt also has medicaid, discussed that [place] [facility] would be his contact for medicaid transportation once he is back in his apartment.  Pt verbalized understanding. Cathlyn Ferry, MSW, LCSW 10/23/20252:23 PM

## 2024-02-03 NOTE — Progress Notes (Signed)
 Physical Therapy Treatment Patient Details Name: Charles Gray MRN: [id number] DOB: November 04, 1955 Today's Date: 02/03/2024   History of Present Illness Pt is a 68 y.o. male admitted 01/27/24 for left leg pressure ulcer from hematoma, necrotic including subcutaneous tissue down to fascia. Pt s/p I&D and wound vac application 10/16. Pt s/p excisional debridement of left leg skin, soft tissue, muscle, and fascia 10/22. PMHx: paroxysmal A-fib, COPD, cirrhosis, DVT/PE in 2021, chronic diastolic CHF, HTN, OSA, GERD, HLD, low back pain, anxiety, CAD, and iron  deficiency anemia. Of note, recent hospitalization to [facility] d/t LLE hematoma with extravasation.    PT Comments  Pt greeted supine in bed, pleasant and agreeable to PT session. He is making steady but slow progress towards his acute PT goals. Pt advanced OOB mobility completing sit<>stand three with modA x2. He performed three reps total, two using RW, and one using stedy. Pt maintained a fwd flex posture and lacked hip extension despite multi-modal cues. He fatigued quickly and was unable to transfer to recliner chair. Will continue to follow acutely and advance appropriately.     If plan is discharge home, recommend the following: Two people to help with walking and/or transfers;Two people to help with bathing/dressing/bathroom;Assistance with cooking/housework;Assist for transportation;Help with stairs or ramp for entrance   Can travel by private vehicle     No  Equipment Recommendations  Hospital bed;Hoyer lift;Wheelchair (measurements PT);Wheelchair cushion (measurements PT)    Recommendations for Other Services       Precautions / Restrictions Precautions Precautions: Fall Recall of Precautions/Restrictions: Impaired Precaution/Restrictions Comments: Wound Vac LLE     Mobility  Bed Mobility Overal bed mobility: Needs Assistance Bed Mobility: Supine to Sit, Sit to Supine Rolling: Contact guard assist, Used rails   Supine to sit:  Min assist, HOB elevated, Used rails Sit to supine: Min assist, HOB elevated, Used rails   General bed mobility comments: Pt sat up on R side of bed with increased time. Assist to manage LLE in/out of bed. He took increased time to complete motion. Pt scooted along EOB to the right prior to returning to supine with BUE support. He rolled R/L using bed rail to get gown out from underneath him. Repositioned using bed features. Pt assist in scooting toward HOB by pulling on bed rail above head with LUE and pushing with RLE, +2 assist using bed pad.    Transfers Overall transfer level: Needs assistance Equipment used: Rolling walker (2 wheels), Ambulation equipment used Transfers: Sit to/from Stand Sit to Stand: From elevated surface, Mod assist, +2 physical assistance, +2 safety/equipment           General transfer comment: Pt stood from raised bed height. He initially using RW. Cued proper hand placement and foot position. Pt powered up with modA x2. He maintained static stance briefly maintaining a fwd lean and hip flex. Cued improved posture, but pt fatigued. Introduced stedy and educated pt on proper use and sequencing. He powered up with modA x2. Pt was able to achieve a more upright posture but still lacking hip ext and maintaining slight fwd lean. Unable to put stedy pads beneath him. Returned to sitting EOB. Pt requested to try with RW again. He again achieved hip clearence from bed but had fwd lean and was unable to pivot or turn to recliner chair. Transfer via Lift Equipment: Stedy  Ambulation/Gait               General Gait Details: Unable   Stairs  Wheelchair Mobility     Tilt Bed    Modified Rankin (Stroke Patients Only)       Balance Overall balance assessment: Needs assistance Sitting-balance support: Bilateral upper extremity supported, Feet supported Sitting balance-Leahy Scale: Good     Standing balance support: Bilateral upper extremity  supported, During functional activity, Reliant on assistive device for balance Standing balance-Leahy Scale: Poor Standing balance comment: Pt dependent on RW and +2 assist.                            Communication Communication Communication: No apparent difficulties  Cognition Arousal: Alert Behavior During Therapy: Flat affect   PT - Cognitive impairments: No family/caregiver present to determine baseline                         Following commands: Intact      Cueing Cueing Techniques: Verbal cues  Exercises      General Comments        Pertinent Vitals/Pain Pain Assessment Pain Assessment: Faces Faces Pain Scale: Hurts even more Pain Location: LLE, abdomen, and shoulders Pain Descriptors / Indicators: Operative site guarding, Grimacing, Discomfort, Aching Pain Intervention(s): Premedicated before session, Monitored during session, Limited activity within patient's tolerance, Repositioned    Home Living                          Prior Function            PT Goals (current goals can now be found in the care plan section) Acute Rehab PT Goals Patient Stated Goal: Regain independence and Return Home PT Goal Formulation: With patient Time For Goal Achievement: 02/11/24 Potential to Achieve Goals: Good Progress towards PT goals: Progressing toward goals    Frequency    Min 2X/week      PT Plan      Co-evaluation              AM-PAC PT 6 Clicks Mobility   Outcome Measure  Help needed turning from your back to your side while in a flat bed without using bedrails?: A Little Help needed moving from lying on your back to sitting on the side of a flat bed without using bedrails?: A Little Help needed moving to and from a bed to a chair (including a wheelchair)?: Total Help needed standing up from a chair using your arms (e.g., wheelchair or bedside chair)?: Total Help needed to walk in hospital room?: Total Help needed  climbing 3-5 steps with a railing? : Total 6 Click Score: 10    End of Session Equipment Utilized During Treatment: Gait belt Activity Tolerance: Patient tolerated treatment well;Patient limited by pain;Patient limited by fatigue Patient left: in bed;with call bell/phone within reach;with bed alarm set Nurse Communication: Mobility status;Need for lift equipment PT Visit Diagnosis: Pain;Other abnormalities of gait and mobility (R26.89);Unsteadiness on feet (R26.81);Difficulty in walking, not elsewhere classified (R26.2) Pain - Right/Left: Left Pain - part of body: Leg     Time: 9070-8994 PT Time Calculation (min) (ACUTE ONLY): 36 min  Charges:    $Therapeutic Activity: 23-37 mins PT General Charges $$ ACUTE PT VISIT: 1 Visit                     Randall SAUNDERS, PT, DPT Acute Rehabilitation Services Office: [phone] Secure Chat Preferred  Charles Gray 02/03/2024, 11:07 AM

## 2024-02-03 NOTE — Progress Notes (Signed)
 PROGRESS NOTE  Charles Gray FMW:[id number] DOB: [birth year] DOA: 01/27/2024 PCP: Sabas Norleen PARAS., MD   LOS: 7 days   Brief Narrative / Interim history: 68 year old male currently living in an SNF, PAF, COPD, cirrhosis, DVT/PE in 2021, chronic diastolic CHF, HTN, OSA, HLD, CAD who comes into the hospital with left lower extremity hematoma.  He was recently hospitalized to [facility] with spontaneous left lower extremity hematoma, active extravasation on CT angiogram.  IR and surgery were consulted over there but he did not appear to have underwent any specific interventions.  His anticoagulation was reversed and he was eventually discharged to SNF.  During dressing change at the SNF he has had more bleeding prompting his return to the hospital.  On 10/16 he underwent I&D of subcutaneous tissue including fascia wound with placement of a wound VAC.  He will have an additional I&D 10/22  Subjective / 24h Interval events: Complains of pain 8/10 at the surgical site.  Wound VAC in place.  No fever or chills.  Assesement and Plan: Principal problem Left leg hematoma, necrotic skin and soft tissue of the left leg -appreciate orthopedic surgery follow-up.  Underwent an I&D on 10/16, he was taken again to the OR on 10/22 status post excisional debridement skin and soft tissue muscle, currently has a wound VAC in place - Per orthopedics, he could return to his SNF, needs wound VAC for another week until outpatient follow-up  Active problems Hypokalemia-replenished, continue to monitor  Acute blood loss anemia, underlying chronic normocytic anemia-received total of 4 units of packed red blood cells, hemoglobin is above 9  Thrombocytopenia-chronic, stable  PAF-in sinus, continue metoprolol , hold anticoagulation  Essential hypertension-continue metoprolol   BPH-continue tamsulosin   COPD, OSA-stable, not on CPAP  History of DVT/PE-off Eliquis  now  Liver  cirrhosis-noted  Hyperlipidemia-continue statin  Depression-continue sertraline   Obesity, class I-BMI 32  Scheduled Meds:  sodium chloride    Intravenous Once   ascorbic acid  500 mg Oral Daily   ferrous sulfate   325 mg Oral Q breakfast   folic acid  1 mg Oral Daily   gabapentin   800 mg Oral TID   lactulose   20 g Oral BID   magnesium  oxide  400 mg Oral Daily   methocarbamol   500 mg Oral TID   metoprolol  succinate  25 mg Oral Daily   pantoprazole   40 mg Oral Daily   polyethylene glycol  17 g Oral BID   pravastatin   20 mg Oral Daily   senna  1 tablet Oral BID   sertraline   100 mg Oral QHS   sodium chloride  flush  3 mL Intravenous Q12H   tamsulosin   0.4 mg Oral q1800   Continuous Infusions:   ceFAZolin (ANCEF) IV 1 g (02/03/24 0505)   PRN Meds:.acetaminophen  **OR** acetaminophen , albuterol , HYDROmorphone  (DILAUDID ) injection, ondansetron  (ZOFRAN ) IV, oxyCODONE , zolpidem   Current Outpatient Medications  Medication Instructions   acetaminophen  (TYLENOL ) 1,000 mg, Oral, Every 6 hours, x6 days (01/21/24-01/27/24)    apixaban  (ELIQUIS ) 5 mg, Oral, 2 times daily   ascorbic acid (VITAMIN C) 500 mg, Oral, Daily   ferrous sulfate  325 mg, Oral, Daily with breakfast   folic acid (FOLVITE) 1 mg, Oral, Daily   gabapentin  (NEURONTIN ) 800 mg, Oral, 3 times daily   lactulose  (encephalopathy) (CHRONULAC ) 20 g, Oral, 2 times daily   magnesium  oxide (MAG-OX) 400 mg, Oral, Daily   methocarbamol  (ROBAXIN ) 500 mg, Oral, 3 times daily   methocarbamol  (ROBAXIN ) 750 mg, Oral, 3 times daily   metoprolol  succinate (  TOPROL -XL) 25 mg, Oral, Daily at bedtime   naloxone  (NARCAN ) 0.4 mg, Nasal, See admin instructions, Administer 1 spray (0.4mg ), alternating nostrils ever 65 minutes as needed for unresponsiveness. Call MD and EMS if given.   omeprazole (PRILOSEC) 20 mg, Oral, Daily before breakfast   ondansetron  (ZOFRAN ) 8 mg, Oral, Every 8 hours PRN   oxyCODONE  (ROXICODONE ) 15 mg, Oral, Every 4 hours PRN    polyethylene glycol powder (GLYCOLAX /MIRALAX ) 17 g, Oral, Daily PRN   potassium chloride  SA (KLOR-CON  M) 20 MEQ tablet 20 mEq, Oral, 3 times daily   pravastatin  (PRAVACHOL ) 20 mg, Oral, Daily   sennosides-docusate sodium  (SENOKOT-S) 8.6-50 MG tablet 2 tablets, Oral, 2 times daily   sertraline  (ZOLOFT ) 100 mg, Oral, Daily   tamsulosin  (FLOMAX ) 0.4 mg, Oral, Daily, Additional refills will need to come from PCP   torsemide  (DEMADEX ) 40 mg, Oral, Daily   VENTOLIN  HFA 108 (90 Base) MCG/ACT inhaler 1 puff, Inhalation, Every 4 hours PRN   zolpidem  (AMBIEN ) 10 mg, Oral, At bedtime PRN    Diet Orders (From admission, onward)     Start     Ordered   02/02/24 1725  Diet Heart Room service appropriate? Yes; Fluid consistency: Thin  Diet effective now       Question Answer Comment  Room service appropriate? Yes   Fluid consistency: Thin      02/02/24 1724            DVT prophylaxis: Place and maintain sequential compression device Start: 01/30/24 1714   Lab Results  Component Value Date   PLT 122 (L) 02/02/2024      Code Status: Full Code  Family Communication: No family at bedside  Status is: Inpatient Remains inpatient appropriate because: Severity of illness, SNF 1 to 2 days   Level of care: Med-Surg  Consultants:  Orthopedic surgery  Objective: Vitals:   02/02/24 2006 02/03/24 0253 02/03/24 0437 02/03/24 0746  BP: 116/62 102/64 111/62 118/67  Pulse: 72  (!) 52 68  Resp: 18 18 17 18   Temp: 98 F (36.7 C)  98 F (36.7 C) 98.2 F (36.8 C)  TempSrc: Oral  Oral   SpO2: 96%  95% 97%  Weight:      Height:        Intake/Output Summary (Last 24 hours) at 02/03/2024 1001 Last data filed at 02/03/2024 0700 Gross per 24 hour  Intake 1040 ml  Output 1210 ml  Net -170 ml   Wt Readings from Last 3 Encounters:  02/02/24 (!) 138.3 kg  10/24/23 (!) 138.3 kg  10/19/23 (!) 139.3 kg    Examination:  Constitutional: NAD Eyes: lids and conjunctivae normal, no  scleral icterus ENMT: mmm Neck: normal, supple Respiratory: clear to auscultation bilaterally, no wheezing, no crackles. Cardiovascular: Regular rate and rhythm, no murmurs / rubs / gallops. No LE edema. Abdomen: soft, no distention, no tenderness. Bowel sounds positive.   Data Reviewed: I have independently reviewed following labs and imaging studies   CBC Recent Labs  Lab 01/27/24 1256 01/28/24 0428 01/30/24 0754 01/30/24 2219 01/31/24 0715 01/31/24 2138 02/01/24 0456 02/02/24 0550  WBC 5.6   < > 4.5  --  3.8* 5.2 4.8 4.6  HGB 9.3*   < > 7.4* 8.2* 8.2* 9.5* 9.2* 9.0*  HCT 30.9*   < > 24.3* 26.1* 26.1* 30.3* 28.8* 28.7*  PLT 118*   < > 121*  --  115* 138* 127* 122*  MCV 84.4   < > 84.1  --  83.9 84.9 84.5 85.4  MCH 25.4*   < > 25.6*  --  26.4 26.6 27.0 26.8  MCHC 30.1   < > 30.5  --  31.4 31.4 31.9 31.4  RDW 16.7*   < > 16.1*  --  15.7* 15.7* 15.7* 15.6*  LYMPHSABS 0.6*  --   --   --   --   --   --   --   MONOABS 0.3  --   --   --   --   --   --   --   EOSABS 0.2  --   --   --   --   --   --   --   BASOSABS 0.0  --   --   --   --   --   --   --    < > = values in this interval not displayed.    Recent Labs  Lab 01/27/24 1256 01/29/24 0950 01/30/24 0754 01/31/24 0715 02/02/24 0550  NA 140 139 138 138 138  K 3.4* 4.0 4.0 3.6 3.5  CL 103 106 104 106 104  CO2 24 23 25 22 26   GLUCOSE 106* 95 86 111* 109*  BUN 14 19 15 15 11   CREATININE 0.82 0.77 0.85 0.96 0.82  CALCIUM 8.7* 8.4* 8.4* 8.2* 8.4*  AST 18 13*  --   --   --   ALT 12 9  --   --   --   ALKPHOS 96 65  --   --   --   BILITOT 0.8 0.4  --   --   --   ALBUMIN  2.9* 2.5*  --   --   --   INR 1.0  --   --   --   --     ------------------------------------------------------------------------------------------------------------------ No results for input(s): CHOL, HDL, LDLCALC, TRIG, CHOLHDL, LDLDIRECT in the last 72 hours.  Lab Results  Component Value Date   HGBA1C 5.2 10/27/2023    ------------------------------------------------------------------------------------------------------------------ No results for input(s): TSH, T4TOTAL, T3FREE, THYROIDAB in the last 72 hours.  Invalid input(s): FREET3  Cardiac Enzymes No results for input(s): CKMB, TROPONINI, MYOGLOBIN in the last 168 hours.  Invalid input(s): CK ------------------------------------------------------------------------------------------------------------------    Component Value Date/Time   BNP 33.7 05/21/2023 1438    CBG: No results for input(s): GLUCAP in the last 168 hours.  Recent Results (from the past 240 hours)  Surgical pcr screen     Status: None   Collection Time: 01/27/24  4:45 PM   Specimen: Nasal Mucosa; Nasal Swab  Result Value Ref Range Status   MRSA, PCR NEGATIVE NEGATIVE Final   Staphylococcus aureus NEGATIVE NEGATIVE Final    Comment: (NOTE) The Xpert SA Assay (FDA approved for NASAL specimens in patients 59 years of age and older), is one component of a comprehensive surveillance program. It is not intended to diagnose infection nor to guide or monitor treatment. Performed at [facility], [address] [place], [place] [address]      Radiology Studies: No results found.   Nilda Fendt, MD, PhD [facility]  Between 7 am - 7 pm I am available, please contact me via Amion (for emergencies) or Securechat (non urgent messages)  Between 7 pm - 7 am I am not available, please contact night coverage MD/APP via Amion

## 2024-02-04 DIAGNOSIS — S8012XD Contusion of left lower leg, subsequent encounter: Secondary | ICD-10-CM | POA: Diagnosis not present

## 2024-02-04 LAB — COMPREHENSIVE METABOLIC PANEL WITH GFR
ALT: 7 U/L (ref 0–44)
AST: 13 U/L — ABNORMAL LOW (ref 15–41)
Albumin: 2.6 g/dL — ABNORMAL LOW (ref 3.5–5.0)
Alkaline Phosphatase: 68 U/L (ref 38–126)
Anion gap: 9 (ref 5–15)
BUN: 12 mg/dL (ref 8–23)
CO2: 27 mmol/L (ref 22–32)
Calcium: 8.4 mg/dL — ABNORMAL LOW (ref 8.9–10.3)
Chloride: 103 mmol/L (ref 98–111)
Creatinine, Ser: 0.82 mg/dL (ref 0.61–1.24)
GFR, Estimated: >60 mL/min (ref >60)
Glucose, Bld: 107 mg/dL — ABNORMAL HIGH (ref 70–99)
Potassium: 3.7 mmol/L (ref 3.5–5.1)
Sodium: 139 mmol/L (ref 135–145)
Total Bilirubin: 0.5 mg/dL (ref 0.0–1.2)
Total Protein: 5.3 g/dL — ABNORMAL LOW (ref 6.5–8.1)

## 2024-02-04 LAB — CBC
HCT: 28 % — ABNORMAL LOW (ref 39.0–52.0)
Hemoglobin: 8.8 g/dL — ABNORMAL LOW (ref 13.0–17.0)
MCH: 27 pg (ref 26.0–34.0)
MCHC: 31.4 g/dL (ref 30.0–36.0)
MCV: 85.9 fL (ref 80.0–100.0)
Platelets: 136 K/uL — ABNORMAL LOW (ref 150–400)
RBC: 3.26 MIL/uL — ABNORMAL LOW (ref 4.22–5.81)
RDW: 15.7 % — ABNORMAL HIGH (ref 11.5–15.5)
WBC: 3.8 K/uL — ABNORMAL LOW (ref 4.0–10.5)
nRBC: 0 % (ref 0.0–0.2)

## 2024-02-04 LAB — MAGNESIUM: Magnesium: 1.9 mg/dL (ref 1.7–2.4)

## 2024-02-04 MED ORDER — APIXABAN 2.5 MG PO TABS: 2.5000 mg | ORAL_TABLET | Freq: Two times a day (BID) | ORAL | Status: AC

## 2024-02-04 MED ORDER — ACETAMINOPHEN 500 MG PO TABS: 1000.0000 mg | ORAL_TABLET | Freq: Four times a day (QID) | ORAL | Status: AC | PRN

## 2024-02-04 MED ORDER — OXYCODONE HCL 15 MG PO TABS: 15.0000 mg | ORAL_TABLET | ORAL | 0 refills | Status: DC | PRN

## 2024-02-04 NOTE — Progress Notes (Signed)
 Wound Vac changed to Prevena.

## 2024-02-04 NOTE — TOC Transition Note (Signed)
 Transition of Care [facility]) - Discharge Note   Patient Details  Name: Charles Gray MRN: [id number] Date of Birth: [birth year]  Transition of Care [facility]) CM/SW Contact:  Bridget Cordella Simmonds, LCSW Phone Number: 02/04/2024, 11:48 AM   Clinical Narrative:   Pt discharging to [facility],room 114.  RN report to: [phone].   PTAR called 1145.   1030: CSW confirmed with Sherrie/[facility] that they can receive pt today.   Final next level of care: Skilled Nursing Facility Barriers to Discharge: Barriers Resolved   Patient Goals and CMS Choice Patient states their goals for this hospitalization and ongoing recovery are:: Patient wants to return to [facility] [place] to continue Rehab stay.          Discharge Placement              Patient chooses bed at:  [facility]) Patient to be transferred to facility by: PTAR Name of family member notified: left message with son [name] Patient and family notified of of transfer: 02/04/24  Discharge Plan and Services Additional resources added to the After Visit Summary for   In-house Referral: Clinical Social Work                                   Social Drivers of Health (SDOH) Interventions SDOH Screenings   Food Insecurity: No Food Insecurity (01/28/2024)  Housing: Low Risk  (01/28/2024)  Transportation Needs: Unmet Transportation Needs (01/28/2024)  Utilities: Not At Risk (01/28/2024)  Alcohol Screen: Low Risk  (06/11/2020)  Depression (PHQ2-9): Medium Risk (06/11/2020)  Financial Resource Strain: Medium Risk (11/15/2020)  Social Connections: Socially Integrated (01/28/2024)  Tobacco Use: Low Risk  (01/27/2024)     Readmission Risk Interventions    01/29/2024    2:23 PM  Readmission Risk Prevention Plan  Transportation Screening Complete  PCP or Specialist Appt within 3-5 Days Complete  HRI or Home Care Consult Complete  Social Work Consult for Recovery Care Planning/Counseling Complete  Palliative  Care Screening Not Applicable  Medication Review Ocean[employer]) Complete

## 2024-02-04 NOTE — Progress Notes (Signed)
 Report called and given to [facility] at [facility]

## 2024-02-04 NOTE — Plan of Care (Signed)
  Problem: Activity: Goal: Risk for activity intolerance will decrease Outcome: Progressing   Problem: Nutrition: Goal: Adequate nutrition will be maintained Outcome: Progressing   Problem: Elimination: Goal: Will not experience complications related to bowel motility Outcome: Progressing Goal: Will not experience complications related to urinary retention Outcome: Progressing   Problem: Pain Managment: Goal: General experience of comfort will improve and/or be controlled Outcome: Progressing

## 2024-02-04 NOTE — Discharge Summary (Signed)
 Physician Discharge Summary  Charles Gray FMW:[id number] DOB: 08/04/1955 DOA: 01/27/2024  PCP: Sabas Norleen PARAS., MD  Admit date: 01/27/2024 Discharge date: 02/04/2024  Admitted From: SNF Disposition:  SNF  Recommendations for Outpatient Follow-up:  Follow up with PCP in 1-2 weeks Continue wound vac for another week Follow-up with Dr. Harden in office in 1 week  Home Health: none Equipment/Devices: none  Discharge Condition: stable CODE STATUS: Full code Diet Orders (From admission, onward)     Start     Ordered   02/02/24 1725  Diet Heart Room service appropriate? Yes; Fluid consistency: Thin  Diet effective now       Question Answer Comment  Room service appropriate? Yes   Fluid consistency: Thin      02/02/24 1724           Brief Narrative / Interim history: 68 year old male currently living in an SNF, PAF, COPD, cirrhosis, DVT/PE in 2021, chronic diastolic CHF, HTN, OSA, HLD, CAD who comes into the hospital with left lower extremity hematoma.  He was recently hospitalized to [facility] with spontaneous left lower extremity hematoma, active extravasation on CT angiogram.  IR and surgery were consulted over there but he did not appear to have underwent any specific interventions.  His anticoagulation was reversed and he was eventually discharged to SNF.  During dressing change at the SNF he has had more bleeding prompting his return to the hospital.   Hospital Course / Discharge diagnoses: Principal problem Left leg hematoma, necrotic skin and soft tissue of the left leg -appreciate orthopedic surgery follow-up.  Underwent an I&D on 10/16, he was taken again to the OR on 10/22 status post excisional debridement skin and soft tissue muscle, currently has a wound VAC in place.  Discussed with orthopedic surgery, he is stable to return to SNF, he needs wound VAC for another week until outpatient follow-up with orthopedic surgery.  Continue pain control, continue to hold  Eliquis  for 2 additional days and okay to resume at 2.5 mg BID this Sunday.   Active problems Hypokalemia-replenished, continue to monitor as an outpatient Acute blood loss anemia, underlying chronic normocytic anemia-received total of 4 units of packed red blood cells, hemoglobin is now stable  Thrombocytopenia-chronic, stable PAF-in sinus, continue metoprolol , hold anticoagulation as above, resume in 2 days Essential hypertension-continue metoprolol  BPH-continue tamsulosin  COPD, OSA-stable, not on CPAP History of DVT/PE-off Eliquis  now Liver cirrhosis-noted Hyperlipidemia-continue statin Depression-continue sertraline  Obesity, class I-BMI 32  Sepsis ruled out   Discharge Instructions  Discharge Instructions     Negative Pressure Wound Therapy - Incisional   Complete by: As directed    Attach the wound VAC dressing to a Prevena plus portable wound VAC pump at time of discharge.  Show patient how to plug this and to keep it charged.      Allergies as of 02/04/2024       Reactions   Penicillins Other (See Comments)   Childhood reaction - convulsions         Medication List     TAKE these medications    acetaminophen  500 MG tablet Commonly known as: TYLENOL  Take 2 tablets (1,000 mg total) by mouth every 6 (six) hours as needed for mild pain (pain score 1-3). x6 days (01/21/24-01/27/24) What changed:  when to take this reasons to take this   apixaban  2.5 MG Tabs tablet Commonly known as: ELIQUIS  Take 1 tablet (2.5 mg total) by mouth 2 (two) times daily. Start taking on: February 06, 2024  What changed:  medication strength how much to take These instructions start on February 06, 2024. If you are unsure what to do until then, ask your doctor or other care provider.   ascorbic acid 500 MG tablet Commonly known as: VITAMIN C Take 500 mg by mouth daily.   ferrous sulfate  325 (65 FE) MG tablet Take 1 tablet (325 mg total) by mouth daily with breakfast.   folic  acid 1 MG tablet Commonly known as: FOLVITE Take 1 mg by mouth daily.   gabapentin  800 MG tablet Commonly known as: NEURONTIN  Take 1 tablet (800 mg total) by mouth 3 (three) times daily.   lactulose  (encephalopathy) 10 GM/15ML Soln Commonly known as: CHRONULAC  Take 30 mLs (20 g total) by mouth 2 (two) times daily. What changed:  when to take this reasons to take this   magnesium  oxide 400 (240 Mg) MG tablet Commonly known as: MAG-OX Take 400 mg by mouth daily.   methocarbamol  500 MG tablet Commonly known as: ROBAXIN  Take 1 tablet (500 mg total) by mouth 3 (three) times daily. What changed: Another medication with the same name was removed. Continue taking this medication, and follow the directions you see here.   metoprolol  succinate 25 MG 24 hr tablet Commonly known as: TOPROL -XL TAKE ONE TABLET BY MOUTH AT BEDTIME What changed: when to take this   Narcan  4 MG/0.1ML Liqd nasal spray kit Generic drug: naloxone  Place 0.4 mg into the nose See admin instructions. Administer 1 spray (0.4mg ), alternating nostrils ever 65 minutes as needed for unresponsiveness. Call MD and EMS if given.   omeprazole 20 MG capsule Commonly known as: PRILOSEC Take 20 mg by mouth daily before breakfast.   ondansetron  8 MG tablet Commonly known as: ZOFRAN  Take 8 mg by mouth every 8 (eight) hours as needed for nausea or vomiting.   oxyCODONE  15 MG immediate release tablet Commonly known as: ROXICODONE  Take 1 tablet (15 mg total) by mouth every 4 (four) hours as needed. What changed: reasons to take this   polyethylene glycol powder 17 GM/SCOOP powder Commonly known as: GLYCOLAX /MIRALAX  Take 17 g by mouth daily as needed (constipation).   potassium chloride  SA 20 MEQ tablet Commonly known as: KLOR-CON  M Take 20 mEq by mouth 3 (three) times daily.   pravastatin  20 MG tablet Commonly known as: PRAVACHOL  Take 1 tablet by mouth daily. What changed: when to take this   sennosides-docusate  sodium 8.6-50 MG tablet Commonly known as: SENOKOT-S Take 2 tablets by mouth in the morning and at bedtime.   sertraline  100 MG tablet Commonly known as: ZOLOFT  Take 100 mg by mouth daily.   tamsulosin  0.4 MG Caps capsule Commonly known as: FLOMAX  Take 1 capsule (0.4 mg total) by mouth daily. Additional refills will need to come from PCP What changed:  when to take this additional instructions   torsemide  20 MG tablet Commonly known as: DEMADEX  Take 40 mg by mouth daily.   Ventolin  HFA 108 (90 Base) MCG/ACT inhaler Generic drug: albuterol  Inhale 1 puff into the lungs every 4 (four) hours as needed for shortness of breath. What changed:  how much to take when to take this reasons to take this   zolpidem  10 MG tablet Commonly known as: AMBIEN  Take 1 tablet (10 mg total) by mouth at bedtime as needed for sleep.         Consultations: Orthopedic surgery   Subjective: - no chest pain, shortness of breath, no abdominal pain, nausea or vomiting.  Discharge Exam: BP 107/64 (BP Location: Left Arm)   Pulse 66   Temp 98 F (36.7 C)   Resp 16   Ht 6' 10 (2.083 m)   Wt (!) 138.3 kg   SpO2 100%   BMI 31.89 kg/m   General: Pt is alert, awake, not in acute distress Cardiovascular: RRR, S1/S2 +, no rubs, no gallops Respiratory: CTA bilaterally, no wheezing, no rhonchi Abdominal: Soft, NT, ND, bowel sounds + Extremities: no edema, no cyanosis    The results of significant diagnostics from this hospitalization (including imaging, microbiology, ancillary and laboratory) are listed below for reference.     Microbiology: Recent Results (from the past 240 hours)  Surgical pcr screen     Status: None   Collection Time: 01/27/24  4:45 PM   Specimen: Nasal Mucosa; Nasal Swab  Result Value Ref Range Status   MRSA, PCR NEGATIVE NEGATIVE Final   Staphylococcus aureus NEGATIVE NEGATIVE Final    Comment: (NOTE) The Xpert SA Assay (FDA approved for NASAL specimens in  patients 82 years of age and older), is one component of a comprehensive surveillance program. It is not intended to diagnose infection nor to guide or monitor treatment. Performed at [facility], [address] [place], [place] [address]      Labs: Basic Metabolic Panel: Recent Labs  Lab 01/29/24 0950 01/30/24 0754 01/31/24 0715 02/02/24 0550 02/04/24 0529  NA 139 138 138 138 139  K 4.0 4.0 3.6 3.5 3.7  CL 106 104 106 104 103  CO2 23 25 22 26 27   GLUCOSE 95 86 111* 109* 107*  BUN 19 15 15 11 12   CREATININE 0.77 0.85 0.96 0.82 0.82  CALCIUM 8.4* 8.4* 8.2* 8.4* 8.4*  MG  --   --   --   --  1.9   Liver Function Tests: Recent Labs  Lab 01/29/24 0950 02/04/24 0529  AST 13* 13*  ALT 9 7  ALKPHOS 65 68  BILITOT 0.4 0.5  PROT 5.5* 5.3*  ALBUMIN  2.5* 2.6*   CBC: Recent Labs  Lab 01/31/24 0715 01/31/24 2138 02/01/24 0456 02/02/24 0550 02/04/24 0529  WBC 3.8* 5.2 4.8 4.6 3.8*  HGB 8.2* 9.5* 9.2* 9.0* 8.8*  HCT 26.1* 30.3* 28.8* 28.7* 28.0*  MCV 83.9 84.9 84.5 85.4 85.9  PLT 115* 138* 127* 122* 136*   CBG: No results for input(s): GLUCAP in the last 168 hours. Hgb A1c No results for input(s): HGBA1C in the last 72 hours. Lipid Profile No results for input(s): CHOL, HDL, LDLCALC, TRIG, CHOLHDL, LDLDIRECT in the last 72 hours. Thyroid  function studies No results for input(s): TSH, T4TOTAL, T3FREE, THYROIDAB in the last 72 hours.  Invalid input(s): FREET3 Urinalysis    Component Value Date/Time   COLORURINE STRAW (A) 10/24/2023 1954   APPEARANCEUR CLEAR 10/24/2023 1954   LABSPEC 1.008 10/24/2023 1954   PHURINE 7.0 10/24/2023 1954   GLUCOSEU NEGATIVE 10/24/2023 1954   HGBUR NEGATIVE 10/24/2023 1954   BILIRUBINUR NEGATIVE 10/24/2023 1954   KETONESUR NEGATIVE 10/24/2023 1954   PROTEINUR NEGATIVE 10/24/2023 1954   NITRITE NEGATIVE 10/24/2023 1954   LEUKOCYTESUR NEGATIVE 10/24/2023 1954    FURTHER DISCHARGE INSTRUCTIONS:    Get Medicines reviewed and adjusted: Please take all your medications with you for your next visit with your Primary MD   Laboratory/radiological data: Please request your Primary MD to go over all hospital tests and procedure/radiological results at the follow up, please ask your Primary MD to get all Hospital records sent  to his/her office.   In some cases, they will be blood work, cultures and biopsy results pending at the time of your discharge. Please request that your primary care M.D. goes through all the records of your hospital data and follows up on these results.   Also Note the following: If you experience worsening of your admission symptoms, develop shortness of breath, life threatening emergency, suicidal or homicidal thoughts you must seek medical attention immediately by calling 911 or calling your MD immediately  if symptoms less severe.   You must read complete instructions/literature along with all the possible adverse reactions/side effects for all the Medicines you take and that have been prescribed to you. Take any new Medicines after you have completely understood and accpet all the possible adverse reactions/side effects.    Do not drive when taking Pain medications or sleeping medications (Benzodaizepines)   Do not take more than prescribed Pain, Sleep and Anxiety Medications. It is not advisable to combine anxiety,sleep and pain medications without talking with your primary care practitioner   Special Instructions: If you have smoked or chewed Tobacco  in the last 2 yrs please stop smoking, stop any regular Alcohol  and or any Recreational drug use.   Wear Seat belts while driving.   Please note: You were cared for by a hospitalist during your hospital stay. Once you are discharged, your primary care physician will handle any further medical issues. Please note that NO REFILLS for any discharge medications will be authorized once you are discharged, as it is  imperative that you return to your primary care physician (or establish a relationship with a primary care physician if you do not have one) for your post hospital discharge needs so that they can reassess your need for medications and monitor your lab values.  Time coordinating discharge: 35 minutes  SIGNED:  Nilda Fendt, MD, PhD 02/04/2024, 8:45 AM

## 2024-02-07 ENCOUNTER — Telehealth: Payer: Self-pay

## 2024-02-07 NOTE — Telephone Encounter (Signed)
 I&D left calf 02/02/2024 advised ok to remove vac and apply dry dressing change daily and we can move appt up. PA advised he will have scheduler call and make an appt for earlier this week. Can go on [facility] or [facility] lots of time open for both providers.

## 2024-02-07 NOTE — Telephone Encounter (Signed)
 Patient called and LVM on triage line. States his wound vac fills up quick and they have been emptying the canisters. Would like to know if this is the correct way or do they need more canisters. He is at a nursing facility. Would like a call back.    CB [phone]

## 2024-02-07 NOTE — Telephone Encounter (Signed)
 [place], [place] with [facility] called concerning issues with wound vac for patient. Would like a CB to discuss.  CB# [phone].  Please advise.  Thank you

## 2024-02-07 NOTE — Telephone Encounter (Signed)
 I called the pt and advised that the vac is not designed to be emptied and that I would call the rehab facility and advise them to remove the vac and apply a dry dressing. The pt states that the facility nurses are emptying in the trash and that he is concerned and wants to leave it on and just keep his appt on Thursday. Again I tried to advise the pt that the vac pump does not work that way and that the nurse should remove and apply a dry dressing and change this daily. The pt states that he did not want to do that and will keep it on until his appt on Thursday. He said that the pump is working just fine and he wants to leave it on.

## 2024-02-08 ENCOUNTER — Emergency Department (HOSPITAL_COMMUNITY)
Admission: EM | Admit: 2024-02-08 | Discharge: 2024-02-08 | Disposition: A | Discharge status: Home / Self Care | Attending: Emergency Medicine | Admitting: Emergency Medicine

## 2024-02-08 ENCOUNTER — Emergency Department (HOSPITAL_COMMUNITY)

## 2024-02-08 ENCOUNTER — Ambulatory Visit: Admitting: Physician Assistant

## 2024-02-08 ENCOUNTER — Encounter: Payer: Self-pay | Admitting: Physician Assistant

## 2024-02-08 ENCOUNTER — Encounter (HOSPITAL_COMMUNITY): Payer: Self-pay

## 2024-02-08 ENCOUNTER — Other Ambulatory Visit: Payer: Self-pay

## 2024-02-08 DIAGNOSIS — I4719 Other supraventricular tachycardia: Secondary | ICD-10-CM

## 2024-02-08 DIAGNOSIS — I110 Hypertensive heart disease with heart failure: Secondary | ICD-10-CM | POA: Insufficient documentation

## 2024-02-08 DIAGNOSIS — I2510 Atherosclerotic heart disease of native coronary artery without angina pectoris: Secondary | ICD-10-CM | POA: Diagnosis not present

## 2024-02-08 DIAGNOSIS — M79605 Pain in left leg: Secondary | ICD-10-CM | POA: Insufficient documentation

## 2024-02-08 DIAGNOSIS — I5032 Chronic diastolic (congestive) heart failure: Secondary | ICD-10-CM | POA: Insufficient documentation

## 2024-02-08 DIAGNOSIS — Z7901 Long term (current) use of anticoagulants: Secondary | ICD-10-CM | POA: Insufficient documentation

## 2024-02-08 DIAGNOSIS — J449 Chronic obstructive pulmonary disease, unspecified: Secondary | ICD-10-CM | POA: Insufficient documentation

## 2024-02-08 DIAGNOSIS — I872 Venous insufficiency (chronic) (peripheral): Secondary | ICD-10-CM

## 2024-02-08 LAB — COMPREHENSIVE METABOLIC PANEL WITH GFR
ALT: 9 U/L (ref 0–44)
AST: 20 U/L (ref 15–41)
Albumin: 2.9 g/dL — ABNORMAL LOW (ref 3.5–5.0)
Alkaline Phosphatase: 79 U/L (ref 38–126)
Anion gap: 14 (ref 5–15)
BUN: 10 mg/dL (ref 8–23)
CO2: 26 mmol/L (ref 22–32)
Calcium: 8.5 mg/dL — ABNORMAL LOW (ref 8.9–10.3)
Chloride: 97 mmol/L — ABNORMAL LOW (ref 98–111)
Creatinine, Ser: 0.79 mg/dL (ref 0.61–1.24)
GFR, Estimated: >60 mL/min (ref >60)
Glucose, Bld: 105 mg/dL — ABNORMAL HIGH (ref 70–99)
Potassium: 3.3 mmol/L — ABNORMAL LOW (ref 3.5–5.1)
Sodium: 137 mmol/L (ref 135–145)
Total Bilirubin: 0.8 mg/dL (ref 0.0–1.2)
Total Protein: 5.8 g/dL — ABNORMAL LOW (ref 6.5–8.1)

## 2024-02-08 LAB — CBC WITH DIFFERENTIAL/PLATELET
Abs Immature Granulocytes: 0.04 K/uL (ref 0.00–0.07)
Basophils Absolute: 0 K/uL (ref 0.0–0.1)
Basophils Relative: 1 %
Eosinophils Absolute: 0.1 K/uL (ref 0.0–0.5)
Eosinophils Relative: 1 %
HCT: 32.7 % — ABNORMAL LOW (ref 39.0–52.0)
Hemoglobin: 9.9 g/dL — ABNORMAL LOW (ref 13.0–17.0)
Immature Granulocytes: 1 %
Lymphocytes Relative: 8 %
Lymphs Abs: 0.7 K/uL (ref 0.7–4.0)
MCH: 26 pg (ref 26.0–34.0)
MCHC: 30.3 g/dL (ref 30.0–36.0)
MCV: 85.8 fL (ref 80.0–100.0)
Monocytes Absolute: 0.6 K/uL (ref 0.1–1.0)
Monocytes Relative: 7 %
Neutro Abs: 6.9 K/uL (ref 1.7–7.7)
Neutrophils Relative %: 82 %
Platelets: 176 K/uL (ref 150–400)
RBC: 3.81 MIL/uL — ABNORMAL LOW (ref 4.22–5.81)
RDW: 15.7 % — ABNORMAL HIGH (ref 11.5–15.5)
WBC: 8.4 K/uL (ref 4.0–10.5)
nRBC: 0.2 % (ref 0.0–0.2)

## 2024-02-08 LAB — I-STAT CHEM 8, ED
BUN: 10 mg/dL (ref 8–23)
Calcium, Ion: 1.12 mmol/L — ABNORMAL LOW (ref 1.15–1.40)
Chloride: 97 mmol/L — ABNORMAL LOW (ref 98–111)
Creatinine, Ser: 0.8 mg/dL (ref 0.61–1.24)
Glucose, Bld: 102 mg/dL — ABNORMAL HIGH (ref 70–99)
HCT: 32 % — ABNORMAL LOW (ref 39.0–52.0)
Hemoglobin: 10.9 g/dL — ABNORMAL LOW (ref 13.0–17.0)
Potassium: 3.2 mmol/L — ABNORMAL LOW (ref 3.5–5.1)
Sodium: 138 mmol/L (ref 135–145)
TCO2: 27 mmol/L (ref 22–32)

## 2024-02-08 LAB — LIPASE, BLOOD: Lipase: 21 U/L (ref 11–51)

## 2024-02-08 LAB — I-STAT CG4 LACTIC ACID, ED: Lactic Acid, Venous: 1.8 mmol/L (ref 0.5–1.9)

## 2024-02-08 MED ORDER — OXYCODONE HCL 5 MG PO TABS
15.0000 mg | ORAL_TABLET | Freq: Once | ORAL | Status: AC
  Administered 2024-02-08: 15 mg via ORAL
  Filled 2024-02-08: qty 3

## 2024-02-08 MED ORDER — POTASSIUM CHLORIDE CRYS ER 20 MEQ PO TBCR
40.0000 meq | EXTENDED_RELEASE_TABLET | Freq: Once | ORAL | Status: AC
Start: 2024-02-08 — End: 2024-02-08
  Administered 2024-02-08: 40 meq via ORAL
  Filled 2024-02-08: qty 2

## 2024-02-08 MED ORDER — HYDROMORPHONE HCL 1 MG/ML IJ SOLN
0.5000 mg | Freq: Once | INTRAMUSCULAR | Status: AC
  Administered 2024-02-08: 0.5 mg via INTRAVENOUS
  Filled 2024-02-08: qty 1

## 2024-02-08 NOTE — ED Notes (Signed)
 Phlebotomy at bedside.

## 2024-02-08 NOTE — ED Notes (Signed)
PT aware a urine sample is needed.

## 2024-02-08 NOTE — ED Provider Notes (Signed)
 [facility] EMERG[facility] DEPARTMENT AT [facility] Provider Note   CSN: [id number] Arrival date & time: 02/08/24  1041     Patient presents with: Weakness and Abdominal Pain   Charles Gray is a 68 y.o. male.   68 year old male with prior medical history including PAF, COPD, cirrhosis, DVT/PE in 2021, chronic diastolic CHF, HTN, OSA, HLD, CAD presents on Ortho care for evaluation.  Patient was at Ortho care for postoperative follow-up and wound VAC removal after debridement of a hematoma to the left lower leg.  Patient was noted to be nauseated and apparently weaker than normal by [facility] while in the clinic.  Patient was referred to the ED for further evaluation.  Wound VAC was removed from left leg while at the Ortho clinic.  Wound itself was dressed.  [facility] is recommending daily wet-to-dry Vashe  dressing changes.   The history is provided by the patient, medical records and the EMS personnel.  VAshe     Prior to Admission medications   Medication Sig Start Date End Date Taking? Authorizing Provider  acetaminophen  (TYLENOL ) 500 MG tablet Take 2 tablets (1,000 mg total) by mouth every 6 (six) hours as needed for mild pain (pain score 1-3). x6 days (01/21/24-01/27/24) 02/04/24   Gherghe, Nilda HERO, MD  apixaban  (ELIQUIS ) 2.5 MG TABS tablet Take 1 tablet (2.5 mg total) by mouth 2 (two) times daily. 02/06/24   Gherghe, Costin M, MD  ascorbic acid (VITAMIN C) 500 MG tablet Take 500 mg by mouth daily.    [provider]  ferrous sulfate  325 (65 FE) MG tablet Take 1 tablet (325 mg total) by mouth daily with breakfast. 11/05/23   Tobie Yetta HERO, MD  folic acid (FOLVITE) 1 MG tablet Take 1 mg by mouth daily.    [provider]  gabapentin  (NEURONTIN ) 800 MG tablet Take 1 tablet (800 mg total) by mouth 3 (three) times daily. 11/04/23   Tobie Yetta HERO, MD  lactulose , encephalopathy, (CHRONULAC ) 10 GM/15ML SOLN Take 30 mLs (20 g total) by mouth 2 (two) times  daily. Patient taking differently: Take 20 g by mouth daily as needed (constipation). 03/11/22   Fairy Frames, MD  magnesium  oxide (MAG-OX) 400 (240 Mg) MG tablet Take 400 mg by mouth daily.    [provider]  methocarbamol  (ROBAXIN ) 500 MG tablet Take 1 tablet (500 mg total) by mouth 3 (three) times daily. 11/04/23   Tobie Yetta HERO, MD  metoprolol  succinate (TOPROL -XL) 25 MG 24 hr tablet TAKE ONE TABLET BY MOUTH AT BEDTIME Patient taking differently: Take 25 mg by mouth daily. 03/23/23   Rolan Ezra RAMAN, MD  naloxone  (NARCAN ) nasal spray 4 mg/0.1 mL Place 0.4 mg into the nose See admin instructions. Administer 1 spray (0.4mg ), alternating nostrils ever 65 minutes as needed for unresponsiveness. Call MD and EMS if given.    [provider]  omeprazole (PRILOSEC) 20 MG capsule Take 20 mg by mouth daily before breakfast.    [provider]  ondansetron  (ZOFRAN ) 8 MG tablet Take 8 mg by mouth every 8 (eight) hours as needed for nausea or vomiting.    [provider]  oxyCODONE  (ROXICODONE ) 15 MG immediate release tablet Take 1 tablet (15 mg total) by mouth every 4 (four) hours as needed. 02/04/24   Gherghe, Costin M, MD  polyethylene glycol powder (GLYCOLAX /MIRALAX ) 17 GM/SCOOP powder Take 17 g by mouth daily as needed (constipation).    [provider]  potassium chloride  SA (KLOR-CON  M) 20  MEQ tablet Take 20 mEq by mouth 3 (three) times daily.    [provider]  pravastatin  (PRAVACHOL ) 20 MG tablet Take 1 tablet by mouth daily. Patient taking differently: Take 20 mg by mouth daily at 6 PM. 09/16/23   Rolan Ezra RAMAN, MD  sennosides-docusate sodium  (SENOKOT-S) 8.6-50 MG tablet Take 2 tablets by mouth in the morning and at bedtime.    [provider]  sertraline  (ZOLOFT ) 100 MG tablet Take 100 mg by mouth daily.    [provider]  tamsulosin  (FLOMAX ) 0.4 MG CAPS capsule Take 1 capsule (0.4 mg total) by mouth daily. Additional  refills will need to come from PCP Patient taking differently: Take 0.4 mg by mouth daily at 6 PM. 04/01/22   McLean, Dalton S, MD  torsemide  (DEMADEX ) 20 MG tablet Take 40 mg by mouth daily.    [provider]  VENTOLIN  HFA 108 (90 Base) MCG/ACT inhaler Inhale 1 puff into the lungs every 4 (four) hours as needed for shortness of breath. Patient taking differently: Inhale 2 puffs into the lungs every 6 (six) hours as needed for wheezing. 03/23/19   Angiulli, Toribio PARAS, PA-C  zolpidem  (AMBIEN ) 10 MG tablet Take 1 tablet (10 mg total) by mouth at bedtime as needed for sleep. 11/04/23   Tobie Yetta HERO, MD  bumetanide  (BUMEX ) 1 MG tablet Take 4 tablets (4 mg total) by mouth daily with breakfast AND 3 tablets (3 mg total) every evening. 05/10/20 06/13/20  Rolan Ezra RAMAN, MD  metoprolol  tartrate (LOPRESSOR ) 50 MG tablet Take 1 tablet (50 mg total) by mouth 2 (two) times daily. 02/09/20 04/11/20  Gonfa, Taye T, MD    Allergies: Penicillins    Review of Systems  All other systems reviewed and are negative.   Updated Vital Signs BP 133/79 (BP Location: Right Arm)   Pulse 75   Temp 97.9 F (36.6 C) (Oral)   Resp 13   Ht 6' 10 (2.083 m)   Wt (!) 142.9 kg   SpO2 98%   BMI 32.94 kg/m   Physical Exam Vitals and nursing note reviewed.  Constitutional:      General: He is not in acute distress.    Appearance: Normal appearance. He is well-developed.  HENT:     Head: Normocephalic and atraumatic.  Eyes:     Conjunctiva/sclera: Conjunctivae normal.     Pupils: Pupils are equal, round, and reactive to light.  Cardiovascular:     Rate and Rhythm: Normal rate and regular rhythm.     Heart sounds: Normal heart sounds.  Pulmonary:     Effort: Pulmonary effort is normal. No respiratory distress.     Breath sounds: Normal breath sounds.  Abdominal:     General: There is no distension.     Palpations: Abdomen is soft.     Tenderness: There is no abdominal tenderness.  Musculoskeletal:         General: No deformity. Normal range of motion.     Cervical back: Normal range of motion and neck supple.  Skin:    General: Skin is warm and dry.     Comments: Left lower extremity with wound dressing in place.  Given that this was just placed by orthopedics in the outpatient setting, patient is refusing to let me remove it.  Neurological:     General: No focal deficit present.     Mental Status: He is alert and oriented to person, place, and time.     (all labs  ordered are listed, but only abnormal results are displayed) Labs Reviewed  URINALYSIS, W/ REFLEX TO CULTURE (INFECTION SUSPECTED)  COMPREHENSIVE METABOLIC PANEL WITH GFR  CBC WITH DIFFERENTIAL/PLATELET  LIPASE, BLOOD  I-STAT CHEM 8, ED  I-STAT CG4 LACTIC ACID, ED    EKG: None  Radiology: No results found.   Procedures   Medications Ordered in the ED  oxyCODONE  (Oxy IR/ROXICODONE ) immediate release tablet 15 mg (has no administration in time range)                                    Medical Decision Making The patient sent from orthopedic office for evaluation.  Patient was in the Ortho care office for removal of wound VAC from left leg.  Per Ortho, left leg wound is healing well.  Apparently, Ortho PA was concerned because the patient complained of weakness.  Patient without acute complaint on evaluation.  He complains of chronic pain after recent debridement of his left leg.  He denies fever or other acute complaint.  Screening labs obtained are without significant acute abnormality.  Patient without indication for or need for rehospitalization.  He is appropriate for discharge back to his SNF.   Importance of close follow-up stressed.  Strict return precautions given and understood.  Amount and/or Complexity of Data Reviewed Labs: ordered. Radiology: ordered.  Risk Prescription drug management.        Final diagnoses:  Pain of left lower extremity    ED Discharge Orders     None           Laurice Maude BROCKS, MD 02/08/24 1443

## 2024-02-08 NOTE — ED Triage Notes (Addendum)
 PT to etc via [facility] from orthopedic office with co weakness, lethargy, and his heart feeling funny at one point. PT has a history of afib. PT was having trouble getting off the commode.   PT was discharged from here a few days ago.  PT was at the office to have a drain removed from his left lower leg. PT is at [facility] rehabilitation in [place]. PT is also having abd pain.  PT has been off his blood thinner due to the surgery.

## 2024-02-08 NOTE — ED Notes (Signed)
Report called to the facility.

## 2024-02-08 NOTE — Progress Notes (Signed)
 Office Visit Note   Patient: Charles Gray           Date of Birth: 1956-02-23           MRN: [id number] Visit Date: 02/08/2024              Requested by: Sabas Norleen PARAS., MD [address] [place],  [place] [address] PCP: Sabas Norleen PARAS., MD  Chief Complaint  Patient presents with   Left Leg - Routine Post Op    02/02/2024 I&D left calf      HPI: Charles Gray is a  68 y.o. male with a diagnosis of LEFT LEG HEMATOMA who failed conservative measures and elected for surgical management.  He has chronic venous insufficiency skin changes and edema.  He is currently totally dependent on care for ADL's.  He is unable to stand or transfer with multiple person assistance.  He was taken to the restroom this am and it took multiple staff members to lift him from the toilet to th WC.  He developed a left fore arm skin tear.     Patient became unstable with CP, weakness and shaking.  EMC was called and they advised he go to the ED for work up.     He feels very weak, nauseous and stated his eart beat felt feats.  BP 118/75, respiration 20, SAT 95 %, HR 92 regular.  EMS was called for evaluation.  He resides at a SNF currently.    Assessment & Plan: Visit Diagnoses:  1. Chronic venous stasis dermatitis of both lower extremities   2. Atrial tachycardia     Plan: Vashe wet to dry dressing changes daily.  Ace wrap from toes to knee moderate compression.  Elevate the feet above his heart in the supine position to decrease edema and promote healing.    Follow-Up Instructions: Return if symptoms worsen or fail to improve, for after discharge from the hospital 1-2 weeks.SABRA Beers Exam  Patient is alert, oriented, no adenopathy, well-dressed, normal affect, normal respiratory effort. Left medial leg wound vac was removed.  Venous bleeding was controlled with direct pressure and silver nitrate applications.  The wound bed is beefy red with 100% granulation tissue.  The wound measures 30 cm x  16 cm. Doppler signal PT/AT at the ankle monophasic.  Thickened skin from chronic edema with dermitis and scaly skin ankle and foot.  No open wounds on the feet or toes.      Imaging: No results found. No images are attached to the encounter.  Labs: Lab Results  Component Value Date   HGBA1C 5.2 10/27/2023   HGBA1C 5.9 (H) 07/10/2021   HGBA1C 5.2 01/28/2020   CRP 1.0 (H) 08/23/2019   LABURIC 7.9 03/10/2022   REPTSTATUS 07/12/2021 FINAL 07/07/2021   CULT  07/07/2021    NO GROWTH 5 DAYS Performed at [facility], [address] [place], [place] [address]    LABORGA STAPHYLOCOCCUS EPIDERMIDIS (A) 07/07/2021   LABORGA ENTEROCOCCUS FAECALIS (A) 07/07/2021     Lab Results  Component Value Date   ALBUMIN  2.6 (L) 02/04/2024   ALBUMIN  2.5 (L) 01/29/2024   ALBUMIN  2.9 (L) 01/27/2024    Lab Results  Component Value Date   MG 1.9 02/04/2024   MG 1.8 11/04/2023   MG 1.8 10/28/2023   No results found for: VD25OH  No results found for: PREALBUMIN    Latest Ref Rng & Units 02/04/2024    5:29 AM 02/02/2024  5:50 AM 02/01/2024    4:56 AM  CBC EXTENDED  WBC 4.0 - 10.5 K/uL 3.8  4.6  4.8   RBC 4.22 - 5.81 MIL/uL 3.26  3.36  3.41   Hemoglobin 13.0 - 17.0 g/dL 8.8  9.0  9.2   HCT 60.9 - 52.0 % 28.0  28.7  28.8   Platelets 150 - 400 K/uL 136  122  127      There is no height or weight on file to calculate BMI.  Orders:  No orders of the defined types were placed in this encounter.  No orders of the defined types were placed in this encounter.    Procedures: No procedures performed  Clinical Data: No additional findings.  ROS:  All other systems negative, except as noted in the HPI. Review of Systems  Objective: Vital Signs: There were no vitals taken for this visit.  Specialty Comments:  No specialty comments available.  PMFS History: Patient Active Problem List   Diagnosis Date Noted   Leg hematoma 01/27/2024   Wound of left leg 01/27/2024    Fall 10/25/2023   Closed 3-part fracture of proximal end of right humerus 10/25/2023   Leg laceration, right, initial encounter 10/25/2023   Acute on chronic heart failure with preserved ejection fraction (HFpEF) (HCC) 03/04/2022   Dyslipidemia 07/17/2021   Lymphedema 07/08/2021   Melena    Gastric polyps    UGIB (upper gastrointestinal bleed) 04/06/2021   Acute encephalopathy 12/21/2020   COPD (chronic obstructive pulmonary disease) (HCC) 12/21/2020   Chronic diastolic (congestive) heart failure (HCC) 11/14/2020   Syncope 10/28/2020   CHF (congestive heart failure) (HCC) 10/28/2020   Chronic venous stasis dermatitis of both lower extremities 08/27/2020   Cellulitis of both lower extremities 08/14/2020   Iron  deficiency anemia 08/14/2020   GERD without esophagitis 06/10/2020   Atrial fibrillation with rapid ventricular response (HCC) 03/24/2020   Acute on chronic right heart failure (HCC) 01/27/2020   Acute respiratory failure with hypoxia (HCC)    Fatty liver disease, nonalcoholic    Shock (HCC)    Personal history of DVT (deep vein thrombosis) 12/26/2019   Chronic anticoagulation 12/26/2019   Palpitations 12/26/2019   Paroxysmal atrial fibrillation (HCC) 12/23/2019   Heart failure, systolic, acute (HCC) 08/24/2019   Acute on chronic congestive heart failure (HCC)    Acute on chronic diastolic CHF (congestive heart failure) (HCC) 08/23/2019   Chest pain 08/23/2019   Lactic acidosis 08/23/2019   History of pulmonary embolism 08/12/2019   Atrial tachycardia 08/12/2019   Occasional tremors 08/12/2019   OSA (obstructive sleep apnea) 08/12/2019   AKI (acute kidney injury) 08/08/2019   Acute on chronic right-sided heart failure (HCC) 08/06/2019   Acute pulmonary embolism (HCC) 06/08/2019   Normocytic anemia 06/08/2019   Pulmonary emboli (HCC) 06/08/2019   Chronic heart failure with preserved ejection fraction (HCC) 05/30/2019   Class 2 severe obesity due to excess calories  with serious comorbidity and body mass index (BMI) of 39.0 to 39.9 in adult 05/30/2019   Morbid obesity with BMI of 40.0-44.9, adult (HCC) 05/30/2019   Hyponatremia    Drug induced constipation    Peripheral edema    Hypotension due to drugs    Hypoalbuminemia due to protein-calorie malnutrition    Thrombocytopenia    Anemia of chronic disease    Cirrhosis of liver without ascites (HCC)    Chronic pain syndrome    Debility 03/13/2019   Portal hypertensive gastropathy (HCC) 03/06/2019   Dyspnea  GIB (gastrointestinal bleeding) 03/04/2019   Mixed hyperlipidemia 01/24/2019   Insomnia 01/24/2019   Hyperlipidemia 01/24/2019   Essential hypertension 01/24/2019   Lumbar disc herniation 01/24/2019   Past Medical History:  Diagnosis Date   Acute on chronic congestive heart failure (HCC)    Acute on chronic diastolic CHF (congestive heart failure) (HCC) 08/23/2019   Acute on chronic right-sided heart failure (HCC) 08/06/2019   Acute respiratory failure with hypoxia (HCC)    Anemia of chronic disease    Atrial fibrillation (HCC) 12/23/2019   Atrial tachycardia 08/12/2019   Benign essential HTN    Chest pain 08/23/2019   Chronic anticoagulation 12/26/2019   Chronic heart failure with preserved EF 05/30/2019   Echo 07/2019: EF 55-60, GR 1 DD   Chronic pain syndrome    Cirrhosis of liver without ascites (HCC)    Class 2 severe obesity due to excess calories with serious comorbidity and body mass index (BMI) of 39.0 to 39.9 in adult 05/30/2019   Debility 03/13/2019   Drug induced constipation    Dyspnea    Fatty liver disease, nonalcoholic    GIB (gastrointestinal bleeding) 03/04/2019   Heart failure, systolic, acute (HCC) 08/24/2019   History of pulmonary embolism 08/12/2019   Hyperlipidemia 01/24/2019   Hypertension 01/24/2019   Hypoalbuminemia due to protein-calorie malnutrition    Hypokalemia    Hyponatremia    Hypotension due to drugs    Insomnia 01/24/2019   Lactic acidosis 08/23/2019    Lumbar disc herniation 01/24/2019   Mixed hyperlipidemia 01/24/2019   Morbid obesity with BMI of 40.0-44.9, adult (HCC) 05/30/2019   Normocytic anemia 06/08/2019   Occasional tremors 08/12/2019   OSA (obstructive sleep apnea) 08/12/2019   Palpitations 12/26/2019   Peripheral edema    Personal history of DVT (deep vein thrombosis) 12/26/2019   Portal hypertensive gastropathy (HCC) 03/06/2019   Seen on EGD 03/06/2019   Pulmonary emboli (HCC) 06/08/2019   Pulmonary embolism (HCC) 06/08/2019   Redness and swelling of lower leg 08/23/2019   Shock (HCC)    Thrombocytopenia     Family History  Problem Relation Age of Onset   Hyperlipidemia Mother    Hypertension Mother    Stroke Mother    CAD Maternal Grandmother    CAD Maternal Grandfather     Past Surgical History:  Procedure Laterality Date   APPLICATION OF WOUND VAC Left 01/27/2024   Procedure: APPLICATION, WOUND VAC;  Surgeon: Genelle Standing, MD;  Location: [facility] OR;  Service: Orthopedics;  Laterality: Left;   APPLICATION OF WOUND VAC Left 02/02/2024   Procedure: APPLICATION, WOUND VAC;  Surgeon: Harden Jerona GAILS, MD;  Location: [facility] OR;  Service: Orthopedics;  Laterality: Left;   BIOPSY  04/08/2021   Procedure: BIOPSY;  Surgeon: Aneita Gwendlyn DASEN, MD;  Location: [facility] ENDOSCOPY;  Service: Endoscopy;;   ESOPHAGOGASTRODUODENOSCOPY N/A 04/08/2021   Procedure: ESOPHAGOGASTRODUODENOSCOPY (EGD);  Surgeon: Aneita Gwendlyn DASEN, MD;  Location: [facility] ENDOSCOPY;  Service: Endoscopy;  Laterality: N/A;   ESOPHAGOGASTRODUODENOSCOPY (EGD) WITH PROPOFOL  N/A 03/05/2019   Procedure: ESOPHAGOGASTRODUODENOSCOPY (EGD) WITH PROPOFOL ;  Surgeon: Legrand Victory LITTIE DOUGLAS, MD;  Location: [facility] ENDOSCOPY;  Service: Gastroenterology;  Laterality: N/A;  Large Blood Clot Removed   ESOPHAGOGASTRODUODENOSCOPY (EGD) WITH PROPOFOL  N/A 03/06/2019   Procedure: ESOPHAGOGASTRODUODENOSCOPY (EGD) WITH PROPOFOL ;  Surgeon: Eda Iha, MD;  Location: [facility] ENDOSCOPY;  Service: Gastroenterology;   Laterality: N/A;   HEMORROIDECTOMY     INCISION AND DRAINAGE OF DEEP ABSCESS, CALF Left 02/02/2024   Procedure: INCISION AND DRAINAGE OF DEEP  ABSCESS, CALF;  Surgeon: Harden Jerona GAILS, MD;  Location: [facility] OR;  Service: Orthopedics;  Laterality: Left;  LEFT LEG DEBRIDEMENT   INCISION AND DRAINAGE OF WOUND Left 01/27/2024   Procedure: IRRIGATION AND DEBRIDEMENT WOUND;  Surgeon: Genelle Standing, MD;  Location: [facility] OR;  Service: Orthopedics;  Laterality: Left;   PRESSURE SENSOR/CARDIOMEMS N/A 11/14/2020   Procedure: PRESSURE SENSOR/CARDIOMEMS;  Surgeon: Rolan Ezra RAMAN, MD;  Location: [facility] INVASIVE CV LAB;  Service: Cardiovascular;  Laterality: N/A;   RIGHT HEART CATH N/A 08/09/2019   Procedure: RIGHT HEART CATH;  Surgeon: Rolan Ezra RAMAN, MD;  Location: [facility] INVASIVE CV LAB;  Service: Cardiovascular;  Laterality: N/A;   RIGHT HEART CATH N/A 11/01/2020   Procedure: RIGHT HEART CATH;  Surgeon: Rolan Ezra RAMAN, MD;  Location: [facility] INVASIVE CV LAB;  Service: Cardiovascular;  Laterality: N/A;   RIGHT HEART CATH N/A 11/14/2020   Procedure: RIGHT HEART CATH;  Surgeon: Rolan Ezra RAMAN, MD;  Location: [facility] INVASIVE CV LAB;  Service: Cardiovascular;  Laterality: N/A;   RIGHT/LEFT HEART CATH AND CORONARY ANGIOGRAPHY N/A 05/10/2020   Procedure: RIGHT/LEFT HEART CATH AND CORONARY ANGIOGRAPHY;  Surgeon: Rolan Ezra RAMAN, MD;  Location: [facility] INVASIVE CV LAB;  Service: Cardiovascular;  Laterality: N/A;   Social History   Occupational History   Not on file  Tobacco Use   Smoking status: Never   Smokeless tobacco: Never  Vaping Use   Vaping status: Never Used  Substance and Sexual Activity   Alcohol use: Not Currently   Drug use: Not Currently    Types: Marijuana    Comment: Smoked for 30 years   Sexual activity: Not on file

## 2024-02-08 NOTE — ED Notes (Signed)
 Contacted phlebotomy for blood work. This rn tried x2 to straight stick.

## 2024-02-08 NOTE — ED Notes (Signed)
 Called  [facility] ETA 2 HOURS

## 2024-02-08 NOTE — Discharge Instructions (Signed)
 Return for any problem.  ?

## 2024-02-10 ENCOUNTER — Ambulatory Visit: Admitting: Orthopedic Surgery

## 2024-02-14 ENCOUNTER — Encounter: Payer: Self-pay | Admitting: Radiology

## 2024-02-15 ENCOUNTER — Telehealth: Payer: Self-pay

## 2024-02-15 NOTE — Telephone Encounter (Signed)
 [facility] called about pt stating that the once a day wet to dry dressing is not working as the patient is having a lot of discharge from the wound and it keeps seeping through and when they are changing the dressing it is taking new tissue with it. Please advise  CB: [phone] and ask station 1 nurse  [facility]

## 2024-02-15 NOTE — Telephone Encounter (Signed)
 I called and sw nurse and she states that the pt s having increased pain with dressing removal and it took almost an hour to remove in the facility today. States that the Vashe wet/ dry is removing good tissue as well and that he is having quite a bit of bloody drainage.Pt's Eliquis  has been on hold since 02/09/2024. Appt made for eval tomorrow and will update wound care orders after this.

## 2024-02-16 ENCOUNTER — Encounter: Payer: Self-pay | Admitting: Physician Assistant

## 2024-02-16 ENCOUNTER — Ambulatory Visit: Admitting: Physician Assistant

## 2024-02-16 DIAGNOSIS — Z9889 Other specified postprocedural states: Secondary | ICD-10-CM

## 2024-02-16 DIAGNOSIS — I872 Venous insufficiency (chronic) (peripheral): Secondary | ICD-10-CM

## 2024-02-16 NOTE — Progress Notes (Signed)
 Office Visit Note   Patient: Charles Gray           Date of Birth: 03-11-1956           MRN: [id number] Visit Date: 02/16/2024              Requested by: Sabas Norleen PARAS., MD [address] [place],  [place] [address] PCP: Sabas Norleen PARAS., MD  Chief Complaint  Patient presents with   Left Leg - Routine Post Op    02/02/2024 I&D left calf       HPI: Pink Maye is a  68 y.o. male with a diagnosis of LEFT LEG HEMATOMA who failed conservative measures and elected for surgical management.  He is s/p Excisional debridement skin and soft tissue muscle and fascia left leg. Application Kerecis micro graft 95 cm times.  He was discharged with a wound vac and weight bearing as tolerates.  He is working with PT while at the SNF, but he is not able to walk well yet.  He states he will be discharged sometime around the 17th or he will have to stay there and sell all his belongs.  His PCP is trying to help with needs in the future.      Assessment & Plan: Visit Diagnoses:  1. Chronic venous stasis dermatitis of both lower extremities   2. S/P evacuation of hematoma     Plan: Wet to dry Vashe dressing changes twice daily with ace wrap compression.  He should elevate his legs multiple times a day to maintain decreased edema.  He will return to compression socks once the wound has healed.    Follow-Up Instructions: Return in about 1 week (around 02/23/2024).   Ortho Exam  Patient is alert, oriented, no adenopathy, well-dressed, normal affect, normal respiratory effort. The wound bed has 100 % granulation with minimal edema.  It measures 30 cm x 17 cm.  He has darkened skin changes from long standing venous insufficiency and has worn compression sock in the past.  The right LE has evidence of darkened skin changes as well from chronic venous insufficiency.      Imaging: No results found. No images are attached to the encounter.  Labs: Lab Results  Component Value Date    HGBA1C 5.2 10/27/2023   HGBA1C 5.9 (H) 07/10/2021   HGBA1C 5.2 01/28/2020   CRP 1.0 (H) 08/23/2019   LABURIC 7.9 03/10/2022   REPTSTATUS 07/12/2021 FINAL 07/07/2021   CULT  07/07/2021    NO GROWTH 5 DAYS Performed at [facility], [address], [place], [place] [address]    LABORGA STAPHYLOCOCCUS EPIDERMIDIS (A) 07/07/2021   LABORGA ENTEROCOCCUS FAECALIS (A) 07/07/2021     Lab Results  Component Value Date   ALBUMIN  2.9 (L) 02/08/2024   ALBUMIN  2.6 (L) 02/04/2024   ALBUMIN  2.5 (L) 01/29/2024    Lab Results  Component Value Date   MG 1.9 02/04/2024   MG 1.8 11/04/2023   MG 1.8 10/28/2023   No results found for: VD25OH  No results found for: PREALBUMIN    Latest Ref Rng & Units 02/08/2024    1:14 PM 02/08/2024    1:05 PM 02/04/2024    5:29 AM  CBC EXTENDED  WBC 4.0 - 10.5 K/uL  8.4  3.8   RBC 4.22 - 5.81 MIL/uL  3.81  3.26   Hemoglobin 13.0 - 17.0 g/dL 89.0  9.9  8.8   HCT 60.9 - 52.0 % 32.0  32.7  28.0   Platelets 150 - 400 K/uL  176  136   NEUT# 1.7 - 7.7 K/uL  6.9    Lymph# 0.7 - 4.0 K/uL  0.7       There is no height or weight on file to calculate BMI.  Orders:  No orders of the defined types were placed in this encounter.  No orders of the defined types were placed in this encounter.    Procedures: No procedures performed  Clinical Data: No additional findings.  ROS:  All other systems negative, except as noted in the HPI. Review of Systems  Objective: Vital Signs: There were no vitals taken for this visit.  Specialty Comments:  No specialty comments available.  PMFS History: Patient Active Problem List   Diagnosis Date Noted   Leg hematoma 01/27/2024   Wound of left leg 01/27/2024   Fall 10/25/2023   Closed 3-part fracture of proximal end of right humerus 10/25/2023   Leg laceration, right, initial encounter 10/25/2023   Acute on chronic heart failure with preserved ejection fraction (HFpEF) (HCC) 03/04/2022    Dyslipidemia 07/17/2021   Lymphedema 07/08/2021   Melena    Gastric polyps    UGIB (upper gastrointestinal bleed) 04/06/2021   Acute encephalopathy 12/21/2020   COPD (chronic obstructive pulmonary disease) (HCC) 12/21/2020   Chronic diastolic (congestive) heart failure (HCC) 11/14/2020   Syncope 10/28/2020   CHF (congestive heart failure) (HCC) 10/28/2020   Chronic venous stasis dermatitis of both lower extremities 08/27/2020   Cellulitis of both lower extremities 08/14/2020   Iron  deficiency anemia 08/14/2020   GERD without esophagitis 06/10/2020   Atrial fibrillation with rapid ventricular response (HCC) 03/24/2020   Acute on chronic right heart failure (HCC) 01/27/2020   Acute respiratory failure with hypoxia (HCC)    Fatty liver disease, nonalcoholic    Shock (HCC)    Personal history of DVT (deep vein thrombosis) 12/26/2019   Chronic anticoagulation 12/26/2019   Palpitations 12/26/2019   Paroxysmal atrial fibrillation (HCC) 12/23/2019   Heart failure, systolic, acute (HCC) 08/24/2019   Acute on chronic congestive heart failure (HCC)    Acute on chronic diastolic CHF (congestive heart failure) (HCC) 08/23/2019   Chest pain 08/23/2019   Lactic acidosis 08/23/2019   History of pulmonary embolism 08/12/2019   Atrial tachycardia 08/12/2019   Occasional tremors 08/12/2019   OSA (obstructive sleep apnea) 08/12/2019   AKI (acute kidney injury) 08/08/2019   Acute on chronic right-sided heart failure (HCC) 08/06/2019   Acute pulmonary embolism (HCC) 06/08/2019   Normocytic anemia 06/08/2019   Pulmonary emboli (HCC) 06/08/2019   Chronic heart failure with preserved ejection fraction (HCC) 05/30/2019   Class 2 severe obesity due to excess calories with serious comorbidity and body mass index (BMI) of 39.0 to 39.9 in adult 05/30/2019   Morbid obesity with BMI of 40.0-44.9, adult (HCC) 05/30/2019   Hyponatremia    Drug induced constipation    Peripheral edema    Hypotension due to  drugs    Hypoalbuminemia due to protein-calorie malnutrition    Thrombocytopenia    Anemia of chronic disease    Cirrhosis of liver without ascites (HCC)    Chronic pain syndrome    Debility 03/13/2019   Portal hypertensive gastropathy (HCC) 03/06/2019   Dyspnea    GIB (gastrointestinal bleeding) 03/04/2019   Mixed hyperlipidemia 01/24/2019   Insomnia 01/24/2019   Hyperlipidemia 01/24/2019   Essential hypertension 01/24/2019   Lumbar disc herniation 01/24/2019   Past Medical History:  Diagnosis Date  Acute on chronic congestive heart failure (HCC)    Acute on chronic diastolic CHF (congestive heart failure) (HCC) 08/23/2019   Acute on chronic right-sided heart failure (HCC) 08/06/2019   Acute respiratory failure with hypoxia (HCC)    Anemia of chronic disease    Atrial fibrillation (HCC) 12/23/2019   Atrial tachycardia 08/12/2019   Benign essential HTN    Chest pain 08/23/2019   Chronic anticoagulation 12/26/2019   Chronic heart failure with preserved EF 05/30/2019   Echo 07/2019: EF 55-60, GR 1 DD   Chronic pain syndrome    Cirrhosis of liver without ascites (HCC)    Class 2 severe obesity due to excess calories with serious comorbidity and body mass index (BMI) of 39.0 to 39.9 in adult 05/30/2019   Debility 03/13/2019   Drug induced constipation    Dyspnea    Fatty liver disease, nonalcoholic    GIB (gastrointestinal bleeding) 03/04/2019   Heart failure, systolic, acute (HCC) 08/24/2019   History of pulmonary embolism 08/12/2019   Hyperlipidemia 01/24/2019   Hypertension 01/24/2019   Hypoalbuminemia due to protein-calorie malnutrition    Hypokalemia    Hyponatremia    Hypotension due to drugs    Insomnia 01/24/2019   Lactic acidosis 08/23/2019   Lumbar disc herniation 01/24/2019   Mixed hyperlipidemia 01/24/2019   Morbid obesity with BMI of 40.0-44.9, adult (HCC) 05/30/2019   Normocytic anemia 06/08/2019   Occasional tremors 08/12/2019   OSA (obstructive sleep apnea) 08/12/2019    Palpitations 12/26/2019   Peripheral edema    Personal history of DVT (deep vein thrombosis) 12/26/2019   Portal hypertensive gastropathy (HCC) 03/06/2019   Seen on EGD 03/06/2019   Pulmonary emboli (HCC) 06/08/2019   Pulmonary embolism (HCC) 06/08/2019   Redness and swelling of lower leg 08/23/2019   Shock (HCC)    Thrombocytopenia     Family History  Problem Relation Age of Onset   Hyperlipidemia Mother    Hypertension Mother    Stroke Mother    CAD Maternal Grandmother    CAD Maternal Grandfather     Past Surgical History:  Procedure Laterality Date   APPLICATION OF WOUND VAC Left 01/27/2024   Procedure: APPLICATION, WOUND VAC;  Surgeon: Genelle Standing, MD;  Location: [facility] OR;  Service: Orthopedics;  Laterality: Left;   APPLICATION OF WOUND VAC Left 02/02/2024   Procedure: APPLICATION, WOUND VAC;  Surgeon: Harden Jerona GAILS, MD;  Location: [facility] OR;  Service: Orthopedics;  Laterality: Left;   BIOPSY  04/08/2021   Procedure: BIOPSY;  Surgeon: Aneita Gwendlyn DASEN, MD;  Location: [facility] ENDOSCOPY;  Service: Endoscopy;;   ESOPHAGOGASTRODUODENOSCOPY N/A 04/08/2021   Procedure: ESOPHAGOGASTRODUODENOSCOPY (EGD);  Surgeon: Aneita Gwendlyn DASEN, MD;  Location: [facility] ENDOSCOPY;  Service: Endoscopy;  Laterality: N/A;   ESOPHAGOGASTRODUODENOSCOPY (EGD) WITH PROPOFOL  N/A 03/05/2019   Procedure: ESOPHAGOGASTRODUODENOSCOPY (EGD) WITH PROPOFOL ;  Surgeon: Legrand Victory LITTIE DOUGLAS, MD;  Location: [facility] ENDOSCOPY;  Service: Gastroenterology;  Laterality: N/A;  Large Blood Clot Removed   ESOPHAGOGASTRODUODENOSCOPY (EGD) WITH PROPOFOL  N/A 03/06/2019   Procedure: ESOPHAGOGASTRODUODENOSCOPY (EGD) WITH PROPOFOL ;  Surgeon: Eda Iha, MD;  Location: [facility] ENDOSCOPY;  Service: Gastroenterology;  Laterality: N/A;   HEMORROIDECTOMY     INCISION AND DRAINAGE OF DEEP ABSCESS, CALF Left 02/02/2024   Procedure: INCISION AND DRAINAGE OF DEEP ABSCESS, CALF;  Surgeon: Harden Jerona GAILS, MD;  Location: [facility] OR;  Service: Orthopedics;  Laterality:  Left;  LEFT LEG DEBRIDEMENT   INCISION AND DRAINAGE OF WOUND Left 01/27/2024   Procedure: IRRIGATION AND DEBRIDEMENT WOUND;  Surgeon: Genelle Standing, MD;  Location: [facility] OR;  Service: Orthopedics;  Laterality: Left;   PRESSURE SENSOR/CARDIOMEMS N/A 11/14/2020   Procedure: PRESSURE SENSOR/CARDIOMEMS;  Surgeon: Rolan Ezra RAMAN, MD;  Location: [facility] INVASIVE CV LAB;  Service: Cardiovascular;  Laterality: N/A;   RIGHT HEART CATH N/A 08/09/2019   Procedure: RIGHT HEART CATH;  Surgeon: Rolan Ezra RAMAN, MD;  Location: [facility] INVASIVE CV LAB;  Service: Cardiovascular;  Laterality: N/A;   RIGHT HEART CATH N/A 11/01/2020   Procedure: RIGHT HEART CATH;  Surgeon: Rolan Ezra RAMAN, MD;  Location: [facility] INVASIVE CV LAB;  Service: Cardiovascular;  Laterality: N/A;   RIGHT HEART CATH N/A 11/14/2020   Procedure: RIGHT HEART CATH;  Surgeon: Rolan Ezra RAMAN, MD;  Location: [facility] INVASIVE CV LAB;  Service: Cardiovascular;  Laterality: N/A;   RIGHT/LEFT HEART CATH AND CORONARY ANGIOGRAPHY N/A 05/10/2020   Procedure: RIGHT/LEFT HEART CATH AND CORONARY ANGIOGRAPHY;  Surgeon: Rolan Ezra RAMAN, MD;  Location: [facility] INVASIVE CV LAB;  Service: Cardiovascular;  Laterality: N/A;   Social History   Occupational History   Not on file  Tobacco Use   Smoking status: Never   Smokeless tobacco: Never  Vaping Use   Vaping status: Never Used  Substance and Sexual Activity   Alcohol use: Not Currently   Drug use: Not Currently    Types: Marijuana    Comment: Smoked for 30 years   Sexual activity: Not on file

## 2024-02-21 ENCOUNTER — Encounter: Admitting: Orthopedic Surgery

## 2024-02-23 ENCOUNTER — Encounter: Payer: Self-pay | Admitting: Family

## 2024-02-23 ENCOUNTER — Ambulatory Visit: Admitting: Family

## 2024-02-23 ENCOUNTER — Telehealth: Payer: Self-pay | Admitting: Family

## 2024-02-23 DIAGNOSIS — S81802D Unspecified open wound, left lower leg, subsequent encounter: Secondary | ICD-10-CM

## 2024-02-23 MED ORDER — OXYCODONE HCL 15 MG PO TABS: 15.0000 mg | ORAL_TABLET | Freq: Four times a day (QID) | ORAL | 0 refills | Status: DC | PRN

## 2024-02-23 NOTE — Progress Notes (Signed)
 Post-Op Visit Note   Patient: Charles Gray           Date of Birth: 1956-03-16           MRN: [id number] Visit Date: 02/23/2024 PCP: Sabas Norleen PARAS., MD  Chief Complaint:  Chief Complaint  Patient presents with   Left Leg - Routine Post Op    02/02/2024 I&D left calf     HPI:  HPI The patient is a 68 year old gentleman who is seen status post irrigation debridement of the left calf.  He is currently residing at skilled nursing however he reports he will be discharging home, AMA.  His planned departure will be November 18.  He reports he will need assistance with setting up home health nursing for his wound care he also is concerned that he may have issues with transportation to and from doctors appointments . Ortho Exam On examination right lower extremity the wound bed has 100% bleeding granulation with minimal edema to the lower extremity however he does have some hyperpigmentation to bilateral lower extremities which is chronic.  Right lower extremity without any surrounding erythema warmth or sign of infection wound measurements stable at 30 cm x 17 cm  Visit Diagnoses: No diagnosis found.  Plan: Continue with cleansing with Vashe Vashe to dry dressing changes Ace encourage elevation  We will look into setting of home health to aid in his wound care after November 18 should he be discharge home from skilled nursing  Follow-Up Instructions: No follow-ups on file.   Imaging: No results found.  Orders:  No orders of the defined types were placed in this encounter.  No orders of the defined types were placed in this encounter.    PMFS History: Patient Active Problem List   Diagnosis Date Noted   Leg hematoma 01/27/2024   Wound of left leg 01/27/2024   Fall 10/25/2023   Closed 3-part fracture of proximal end of right humerus 10/25/2023   Leg laceration, right, initial encounter 10/25/2023   Acute on chronic heart failure with preserved ejection fraction (HFpEF)  (HCC) 03/04/2022   Dyslipidemia 07/17/2021   Lymphedema 07/08/2021   Melena    Gastric polyps    UGIB (upper gastrointestinal bleed) 04/06/2021   Acute encephalopathy 12/21/2020   COPD (chronic obstructive pulmonary disease) (HCC) 12/21/2020   Chronic diastolic (congestive) heart failure (HCC) 11/14/2020   Syncope 10/28/2020   CHF (congestive heart failure) (HCC) 10/28/2020   Chronic venous stasis dermatitis of both lower extremities 08/27/2020   Cellulitis of both lower extremities 08/14/2020   Iron  deficiency anemia 08/14/2020   GERD without esophagitis 06/10/2020   Atrial fibrillation with rapid ventricular response (HCC) 03/24/2020   Acute on chronic right heart failure (HCC) 01/27/2020   Acute respiratory failure with hypoxia (HCC)    Fatty liver disease, nonalcoholic    Shock (HCC)    Personal history of DVT (deep vein thrombosis) 12/26/2019   Chronic anticoagulation 12/26/2019   Palpitations 12/26/2019   Paroxysmal atrial fibrillation (HCC) 12/23/2019   Heart failure, systolic, acute (HCC) 08/24/2019   Acute on chronic congestive heart failure (HCC)    Acute on chronic diastolic CHF (congestive heart failure) (HCC) 08/23/2019   Chest pain 08/23/2019   Lactic acidosis 08/23/2019   History of pulmonary embolism 08/12/2019   Atrial tachycardia 08/12/2019   Occasional tremors 08/12/2019   OSA (obstructive sleep apnea) 08/12/2019   AKI (acute kidney injury) 08/08/2019   Acute on chronic right-sided heart failure (HCC) 08/06/2019   Acute pulmonary  embolism (HCC) 06/08/2019   Normocytic anemia 06/08/2019   Pulmonary emboli (HCC) 06/08/2019   Chronic heart failure with preserved ejection fraction (HCC) 05/30/2019   Class 2 severe obesity due to excess calories with serious comorbidity and body mass index (BMI) of 39.0 to 39.9 in adult 05/30/2019   Morbid obesity with BMI of 40.0-44.9, adult (HCC) 05/30/2019   Hyponatremia    Drug induced constipation    Peripheral edema     Hypotension due to drugs    Hypoalbuminemia due to protein-calorie malnutrition    Thrombocytopenia    Anemia of chronic disease    Cirrhosis of liver without ascites (HCC)    Chronic pain syndrome    Debility 03/13/2019   Portal hypertensive gastropathy (HCC) 03/06/2019   Dyspnea    GIB (gastrointestinal bleeding) 03/04/2019   Mixed hyperlipidemia 01/24/2019   Insomnia 01/24/2019   Hyperlipidemia 01/24/2019   Essential hypertension 01/24/2019   Lumbar disc herniation 01/24/2019   Past Medical History:  Diagnosis Date   Acute on chronic congestive heart failure (HCC)    Acute on chronic diastolic CHF (congestive heart failure) (HCC) 08/23/2019   Acute on chronic right-sided heart failure (HCC) 08/06/2019   Acute respiratory failure with hypoxia (HCC)    Anemia of chronic disease    Atrial fibrillation (HCC) 12/23/2019   Atrial tachycardia 08/12/2019   Benign essential HTN    Chest pain 08/23/2019   Chronic anticoagulation 12/26/2019   Chronic heart failure with preserved EF 05/30/2019   Echo 07/2019: EF 55-60, GR 1 DD   Chronic pain syndrome    Cirrhosis of liver without ascites (HCC)    Class 2 severe obesity due to excess calories with serious comorbidity and body mass index (BMI) of 39.0 to 39.9 in adult 05/30/2019   Debility 03/13/2019   Drug induced constipation    Dyspnea    Fatty liver disease, nonalcoholic    GIB (gastrointestinal bleeding) 03/04/2019   Heart failure, systolic, acute (HCC) 08/24/2019   History of pulmonary embolism 08/12/2019   Hyperlipidemia 01/24/2019   Hypertension 01/24/2019   Hypoalbuminemia due to protein-calorie malnutrition    Hypokalemia    Hyponatremia    Hypotension due to drugs    Insomnia 01/24/2019   Lactic acidosis 08/23/2019   Lumbar disc herniation 01/24/2019   Mixed hyperlipidemia 01/24/2019   Morbid obesity with BMI of 40.0-44.9, adult (HCC) 05/30/2019   Normocytic anemia 06/08/2019   Occasional tremors 08/12/2019   OSA (obstructive  sleep apnea) 08/12/2019   Palpitations 12/26/2019   Peripheral edema    Personal history of DVT (deep vein thrombosis) 12/26/2019   Portal hypertensive gastropathy (HCC) 03/06/2019   Seen on EGD 03/06/2019   Pulmonary emboli (HCC) 06/08/2019   Pulmonary embolism (HCC) 06/08/2019   Redness and swelling of lower leg 08/23/2019   Shock (HCC)    Thrombocytopenia     Family History  Problem Relation Age of Onset   Hyperlipidemia Mother    Hypertension Mother    Stroke Mother    CAD Maternal Grandmother    CAD Maternal Grandfather     Past Surgical History:  Procedure Laterality Date   APPLICATION OF WOUND VAC Left 01/27/2024   Procedure: APPLICATION, WOUND VAC;  Surgeon: Genelle Standing, MD;  Location: [facility] OR;  Service: Orthopedics;  Laterality: Left;   APPLICATION OF WOUND VAC Left 02/02/2024   Procedure: APPLICATION, WOUND VAC;  Surgeon: Harden Jerona GAILS, MD;  Location: [facility] OR;  Service: Orthopedics;  Laterality: Left;   BIOPSY  04/08/2021  Procedure: BIOPSY;  Surgeon: Aneita Gwendlyn DASEN, MD;  Location: [facility] ENDOSCOPY;  Service: Endoscopy;;   ESOPHAGOGASTRODUODENOSCOPY N/A 04/08/2021   Procedure: ESOPHAGOGASTRODUODENOSCOPY (EGD);  Surgeon: Aneita Gwendlyn DASEN, MD;  Location: [facility] ENDOSCOPY;  Service: Endoscopy;  Laterality: N/A;   ESOPHAGOGASTRODUODENOSCOPY (EGD) WITH PROPOFOL  N/A 03/05/2019   Procedure: ESOPHAGOGASTRODUODENOSCOPY (EGD) WITH PROPOFOL ;  Surgeon: Legrand Victory LITTIE DOUGLAS, MD;  Location: [facility] ENDOSCOPY;  Service: Gastroenterology;  Laterality: N/A;  Large Blood Clot Removed   ESOPHAGOGASTRODUODENOSCOPY (EGD) WITH PROPOFOL  N/A 03/06/2019   Procedure: ESOPHAGOGASTRODUODENOSCOPY (EGD) WITH PROPOFOL ;  Surgeon: Eda Iha, MD;  Location: [facility] ENDOSCOPY;  Service: Gastroenterology;  Laterality: N/A;   HEMORROIDECTOMY     INCISION AND DRAINAGE OF DEEP ABSCESS, CALF Left 02/02/2024   Procedure: INCISION AND DRAINAGE OF DEEP ABSCESS, CALF;  Surgeon: Harden Jerona GAILS, MD;  Location: [facility] OR;  Service:  Orthopedics;  Laterality: Left;  LEFT LEG DEBRIDEMENT   INCISION AND DRAINAGE OF WOUND Left 01/27/2024   Procedure: IRRIGATION AND DEBRIDEMENT WOUND;  Surgeon: Genelle Standing, MD;  Location: [facility] OR;  Service: Orthopedics;  Laterality: Left;   PRESSURE SENSOR/CARDIOMEMS N/A 11/14/2020   Procedure: PRESSURE SENSOR/CARDIOMEMS;  Surgeon: Rolan Ezra RAMAN, MD;  Location: [facility] INVASIVE CV LAB;  Service: Cardiovascular;  Laterality: N/A;   RIGHT HEART CATH N/A 08/09/2019   Procedure: RIGHT HEART CATH;  Surgeon: Rolan Ezra RAMAN, MD;  Location: [facility] INVASIVE CV LAB;  Service: Cardiovascular;  Laterality: N/A;   RIGHT HEART CATH N/A 11/01/2020   Procedure: RIGHT HEART CATH;  Surgeon: Rolan Ezra RAMAN, MD;  Location: [facility] INVASIVE CV LAB;  Service: Cardiovascular;  Laterality: N/A;   RIGHT HEART CATH N/A 11/14/2020   Procedure: RIGHT HEART CATH;  Surgeon: Rolan Ezra RAMAN, MD;  Location: [facility] INVASIVE CV LAB;  Service: Cardiovascular;  Laterality: N/A;   RIGHT/LEFT HEART CATH AND CORONARY ANGIOGRAPHY N/A 05/10/2020   Procedure: RIGHT/LEFT HEART CATH AND CORONARY ANGIOGRAPHY;  Surgeon: Rolan Ezra RAMAN, MD;  Location: [facility] INVASIVE CV LAB;  Service: Cardiovascular;  Laterality: N/A;   Social History   Occupational History   Not on file  Tobacco Use   Smoking status: Never   Smokeless tobacco: Never  Vaping Use   Vaping status: Never Used  Substance and Sexual Activity   Alcohol use: Not Currently   Drug use: Not Currently    Types: Marijuana    Comment: Smoked for 30 years   Sexual activity: Not on file

## 2024-02-23 NOTE — Telephone Encounter (Signed)
 Charles Gray the patient CNA called saying that the company she works for is [employer].  The phone number is [phone]. Call back number is [phone]

## 2024-02-28 ENCOUNTER — Telehealth: Payer: Self-pay | Admitting: Orthopedic Surgery

## 2024-02-28 NOTE — Telephone Encounter (Signed)
 Pt called requesting refill of oxycodone  15 mg. Please send to [facility]. Pt phone number is [phone]

## 2024-02-29 ENCOUNTER — Telehealth: Payer: Self-pay | Admitting: Orthopedic Surgery

## 2024-02-29 MED ORDER — OXYCODONE HCL 15 MG PO TABS: 15.0000 mg | ORAL_TABLET | Freq: Four times a day (QID) | ORAL | 0 refills | Status: DC | PRN

## 2024-02-29 NOTE — Telephone Encounter (Signed)
 Pt called asking if referral was send to [facility] wound care. Please call pt at [phone].

## 2024-02-29 NOTE — Telephone Encounter (Signed)
 Patient called and said that he needs a FL2 form for his ADL's. They need to attach the company name [employer] [employer]. CB#[phone]

## 2024-03-01 ENCOUNTER — Telehealth: Payer: Self-pay | Admitting: Orthopedic Surgery

## 2024-03-01 NOTE — Telephone Encounter (Signed)
 Called company below. They do not do wound care dressings. They are only a PCS company and not allowed for nursing skills to be involved. I will need to send a referral to a home health agency.

## 2024-03-01 NOTE — Telephone Encounter (Signed)
 Duplicate message

## 2024-03-01 NOTE — Telephone Encounter (Signed)
 Sending to [facility]. Will close this message out since this is duplicate.

## 2024-03-01 NOTE — Telephone Encounter (Signed)
 Sent to [facility].

## 2024-03-01 NOTE — Telephone Encounter (Signed)
 Patient called and said he needs someone to come out to his apartment to change his dressing. He stated per Harden he said he would have a nurse to come to his place. CB#[phone]

## 2024-03-02 ENCOUNTER — Telehealth: Payer: Self-pay

## 2024-03-02 NOTE — Telephone Encounter (Signed)
 Patient called again concerning HHN coming out.  I did advise of previous message concerning [facility].  CB# [phone].  Please advise.  Thank you.

## 2024-03-02 NOTE — Telephone Encounter (Signed)
 I am completing for him and will fax.

## 2024-03-02 NOTE — Telephone Encounter (Signed)
 I SW pt, it is not an FL2, it is a PCS form for ADLs for eval on his limitations and in home services he can get through [facility]

## 2024-03-02 NOTE — Telephone Encounter (Signed)
 Pt informed. [facility] is coming out tomorrow to eval for wound care. He wants me to resubmit a PCS form for eval on ADLs to see what his limits are at home and to qualify for a CNA to sit with him.

## 2024-03-08 ENCOUNTER — Encounter: Payer: Self-pay | Admitting: Family

## 2024-03-08 ENCOUNTER — Ambulatory Visit (INDEPENDENT_AMBULATORY_CARE_PROVIDER_SITE_OTHER): Admitting: Family

## 2024-03-08 DIAGNOSIS — S81802D Unspecified open wound, left lower leg, subsequent encounter: Secondary | ICD-10-CM

## 2024-03-08 DIAGNOSIS — S8011XS Contusion of right lower leg, sequela: Secondary | ICD-10-CM

## 2024-03-08 NOTE — Progress Notes (Signed)
 Post-Op Visit Note   Patient: Charles Gray           Date of Birth: 02/11/1956           MRN: [id number] Visit Date: 03/08/2024 PCP: Sabas Norleen PARAS., MD  Chief Complaint:  Chief Complaint  Patient presents with   Left Leg - Routine Post Op    02/02/2024 I&D left calf    HPI:  HPI The patient is a 68 year old gentleman who is seen status post irrigation debridement of the left calf.    Has discharged home. Is not yet having HH services.  Ortho Exam On examination right lower extremity the wound bed has 100% bleeding granulation with moderate edema to the lower extremity, significant foot edema. however he does have some hyperpigmentation to bilateral lower extremities which is chronic.  Right lower extremity without any surrounding erythema warmth or sign of infection wound size stable full beefy granulation.  Visit Diagnoses: No diagnosis found.  Plan: Continue with cleansing with Vashe. Vashe to dry dressing changes Ace wraps and encourage elevation  Will reach out to [facility] for PT too.  Follow-Up Instructions: No follow-ups on file.   Imaging: No results found.  Orders:  No orders of the defined types were placed in this encounter.  No orders of the defined types were placed in this encounter.    PMFS History: Patient Active Problem List   Diagnosis Date Noted   Leg hematoma 01/27/2024   Wound of left leg 01/27/2024   Fall 10/25/2023   Closed 3-part fracture of proximal end of right humerus 10/25/2023   Leg laceration, right, initial encounter 10/25/2023   Acute on chronic heart failure with preserved ejection fraction (HFpEF) (HCC) 03/04/2022   Dyslipidemia 07/17/2021   Lymphedema 07/08/2021   Melena    Gastric polyps    UGIB (upper gastrointestinal bleed) 04/06/2021   Acute encephalopathy 12/21/2020   COPD (chronic obstructive pulmonary disease) (HCC) 12/21/2020   Chronic diastolic (congestive) heart failure (HCC) 11/14/2020   Syncope 10/28/2020    CHF (congestive heart failure) (HCC) 10/28/2020   Chronic venous stasis dermatitis of both lower extremities 08/27/2020   Cellulitis of both lower extremities 08/14/2020   Iron  deficiency anemia 08/14/2020   GERD without esophagitis 06/10/2020   Atrial fibrillation with rapid ventricular response (HCC) 03/24/2020   Acute on chronic right heart failure (HCC) 01/27/2020   Acute respiratory failure with hypoxia (HCC)    Fatty liver disease, nonalcoholic    Shock (HCC)    Personal history of DVT (deep vein thrombosis) 12/26/2019   Chronic anticoagulation 12/26/2019   Palpitations 12/26/2019   Paroxysmal atrial fibrillation (HCC) 12/23/2019   Heart failure, systolic, acute (HCC) 08/24/2019   Acute on chronic congestive heart failure (HCC)    Acute on chronic diastolic CHF (congestive heart failure) (HCC) 08/23/2019   Chest pain 08/23/2019   Lactic acidosis 08/23/2019   History of pulmonary embolism 08/12/2019   Atrial tachycardia 08/12/2019   Occasional tremors 08/12/2019   OSA (obstructive sleep apnea) 08/12/2019   AKI (acute kidney injury) 08/08/2019   Acute on chronic right-sided heart failure (HCC) 08/06/2019   Acute pulmonary embolism (HCC) 06/08/2019   Normocytic anemia 06/08/2019   Pulmonary emboli (HCC) 06/08/2019   Chronic heart failure with preserved ejection fraction (HCC) 05/30/2019   Class 2 severe obesity due to excess calories with serious comorbidity and body mass index (BMI) of 39.0 to 39.9 in adult 05/30/2019   Morbid obesity with BMI of 40.0-44.9, adult (HCC) 05/30/2019  Hyponatremia    Drug induced constipation    Peripheral edema    Hypotension due to drugs    Hypoalbuminemia due to protein-calorie malnutrition    Thrombocytopenia    Anemia of chronic disease    Cirrhosis of liver without ascites (HCC)    Chronic pain syndrome    Debility 03/13/2019   Portal hypertensive gastropathy (HCC) 03/06/2019   Dyspnea    GIB (gastrointestinal bleeding) 03/04/2019    Mixed hyperlipidemia 01/24/2019   Insomnia 01/24/2019   Hyperlipidemia 01/24/2019   Essential hypertension 01/24/2019   Lumbar disc herniation 01/24/2019   Past Medical History:  Diagnosis Date   Acute on chronic congestive heart failure (HCC)    Acute on chronic diastolic CHF (congestive heart failure) (HCC) 08/23/2019   Acute on chronic right-sided heart failure (HCC) 08/06/2019   Acute respiratory failure with hypoxia (HCC)    Anemia of chronic disease    Atrial fibrillation (HCC) 12/23/2019   Atrial tachycardia 08/12/2019   Benign essential HTN    Chest pain 08/23/2019   Chronic anticoagulation 12/26/2019   Chronic heart failure with preserved EF 05/30/2019   Echo 07/2019: EF 55-60, GR 1 DD   Chronic pain syndrome    Cirrhosis of liver without ascites (HCC)    Class 2 severe obesity due to excess calories with serious comorbidity and body mass index (BMI) of 39.0 to 39.9 in adult 05/30/2019   Debility 03/13/2019   Drug induced constipation    Dyspnea    Fatty liver disease, nonalcoholic    GIB (gastrointestinal bleeding) 03/04/2019   Heart failure, systolic, acute (HCC) 08/24/2019   History of pulmonary embolism 08/12/2019   Hyperlipidemia 01/24/2019   Hypertension 01/24/2019   Hypoalbuminemia due to protein-calorie malnutrition    Hypokalemia    Hyponatremia    Hypotension due to drugs    Insomnia 01/24/2019   Lactic acidosis 08/23/2019   Lumbar disc herniation 01/24/2019   Mixed hyperlipidemia 01/24/2019   Morbid obesity with BMI of 40.0-44.9, adult (HCC) 05/30/2019   Normocytic anemia 06/08/2019   Occasional tremors 08/12/2019   OSA (obstructive sleep apnea) 08/12/2019   Palpitations 12/26/2019   Peripheral edema    Personal history of DVT (deep vein thrombosis) 12/26/2019   Portal hypertensive gastropathy (HCC) 03/06/2019   Seen on EGD 03/06/2019   Pulmonary emboli (HCC) 06/08/2019   Pulmonary embolism (HCC) 06/08/2019   Redness and swelling of lower leg 08/23/2019   Shock (HCC)     Thrombocytopenia     Family History  Problem Relation Age of Onset   Hyperlipidemia Mother    Hypertension Mother    Stroke Mother    CAD Maternal Grandmother    CAD Maternal Grandfather     Past Surgical History:  Procedure Laterality Date   APPLICATION OF WOUND VAC Left 01/27/2024   Procedure: APPLICATION, WOUND VAC;  Surgeon: Genelle Standing, MD;  [facility];  Service: Orthopedics;  Laterality: Left;   APPLICATION OF WOUND VAC Left 02/02/2024   Procedure: APPLICATION, WOUND VAC;  Surgeon: Harden Jerona GAILS, MD;  Location: [facility] OR;  Service: Orthopedics;  Laterality: Left;   BIOPSY  04/08/2021   Procedure: BIOPSY;  Surgeon: Aneita Gwendlyn DASEN, MD;  Location: [facility] ENDOSCOPY;  Service: Endoscopy;;   ESOPHAGOGASTRODUODENOSCOPY N/A 04/08/2021   Procedure: ESOPHAGOGASTRODUODENOSCOPY (EGD);  Surgeon: Aneita Gwendlyn DASEN, MD;  Location[facility] ENDOSCOPY;  Service: Endoscopy;  Laterality: N/A;   ESOPHAGOGASTRODUODENOSCOPY (EGD) WITH PROPOFOL  N/A 03/05/2019   Procedure: ESOPHAGOGASTRODUODENOSCOPY (EGD) WITH PROPOFOL ;  Surgeon: Legrand Victory CROME  III, MD;  [facility] ENDOSCOPY;  Service: Gastroenterology;  Laterality: N/A;  Large Blood Clot Removed   ESOPHAGOGASTRODUODENOSCOPY (EGD) WITH PROPOFOL  N/A 03/06/2019   Procedure: ESOPHAGOGASTRODUODENOSCOPY (EGD) WITH PROPOFOL ;  Surgeon: Eda Iha, MD;  Location: [facility] ENDOSCOPY;  Service: Gastroenterology;  Laterality: N/A;   HEMORROIDECTOMY     INCISION AND DRAINAGE OF DEEP ABSCESS, CALF Left 02/02/2024   Procedure: INCISION AND DRAINAGE OF DEEP ABSCESS, CALF;  Surgeon: Harden Jerona GAILS, MD;  [facility] OR;  Service: Orthopedics;  Laterality: Left;  LEFT LEG DEBRIDEMENT   INCISION AND DRAINAGE OF WOUND Left 01/27/2024   Procedure: IRRIGATION AND DEBRIDEMENT WOUND;  Surgeon: Genelle Standing, MD;  [facility];  Service: Orthopedics;  Laterality: Left;   PRESSURE SENSOR/CARDIOMEMS N/A 11/14/2020   Procedure: PRESSURE SENSOR/CARDIOMEMS;  Surgeon: Rolan Ezra RAMAN, MD;  Location: [facility] INVASIVE CV LAB;  Service: Cardiovascular;  Laterality: N/A;   RIGHT HEART CATH N/A 08/09/2019   Procedure: RIGHT HEART CATH;  Surgeon: Rolan Ezra RAMAN, MD;  Location: [facility] INVASIVE CV LAB;  Service: Cardiovascular;  Laterality: N/A;   RIGHT HEART CATH N/A 11/01/2020   Procedure: RIGHT HEART CATH;  Surgeon: Rolan Ezra RAMAN, MD;  Location: [facility] INVASIVE CV LAB;  Service: Cardiovascular;  Laterality: N/A;   RIGHT HEART CATH N/A 11/14/2020   Procedure: RIGHT HEART CATH;  Surgeon: Rolan Ezra RAMAN, MD;  Location: [facility] INVASIVE CV LAB;  Service: Cardiovascular;  Laterality: N/A;   RIGHT/LEFT HEART CATH AND CORONARY ANGIOGRAPHY N/A 05/10/2020   Procedure: RIGHT/LEFT HEART CATH AND CORONARY ANGIOGRAPHY;  Surgeon: Rolan Ezra RAMAN, MD;  Location: [facility] INVASIVE CV LAB;  Service: Cardiovascular;  Laterality: N/A;   Social History   Occupational History   Not on file  Tobacco Use   Smoking status: Never   Smokeless tobacco: Never  Vaping Use   Vaping status: Never Used  Substance and Sexual Activity   Alcohol use: Not Currently   Drug use: Not Currently    Types: Marijuana    Comment: Smoked for 30 years   Sexual activity: Not on file

## 2024-03-13 ENCOUNTER — Telehealth: Payer: Self-pay | Admitting: Family

## 2024-03-13 ENCOUNTER — Telehealth: Payer: Self-pay

## 2024-03-13 ENCOUNTER — Telehealth: Payer: Self-pay | Admitting: Orthopedic Surgery

## 2024-03-13 NOTE — Telephone Encounter (Signed)
 Hildreth with [facility] would like to know if patient can have an order for twice a week or discharge for left leg.  Stated that dressing is not being changed daily.  Patient has a caregiver, but will not help with dressing changes.  Would like to know if Dr. Harden would like for patient to have unna boot for right LE.  CB# [phone].  Please advise.  Thank you

## 2024-03-13 NOTE — Telephone Encounter (Signed)
 Pt called a refill of pain medication. Please send to [facility]. Pt phone number is [phone]. Asking for it to be sent right away.

## 2024-03-13 NOTE — Telephone Encounter (Signed)
 Patient called and needs medication sent to the pharmacy for pain. CB#[phone]

## 2024-03-14 ENCOUNTER — Other Ambulatory Visit: Payer: Self-pay | Admitting: Orthopedic Surgery

## 2024-03-14 ENCOUNTER — Telehealth: Payer: Self-pay | Admitting: Orthopedic Surgery

## 2024-03-14 MED ORDER — OXYCODONE HCL 15 MG PO TABS: 15.0000 mg | ORAL_TABLET | Freq: Three times a day (TID) | ORAL | 0 refills | Status: DC | PRN

## 2024-03-14 NOTE — Telephone Encounter (Signed)
 The message from yesterday has already been sent to MD. We have a 48 hour turn around time on refill requests.

## 2024-03-14 NOTE — Telephone Encounter (Signed)
 Patient has called again, needing refill for his oxycodone . He is s/p I&D of leg. Last oxycodone  was filled was 02/29/24. Can you please send in for him today? It hasn't been addressed yet.

## 2024-03-14 NOTE — Telephone Encounter (Signed)
 Pt called wanting to know why his dosage on his Oxycodone  30 mg is now 15 mg. Call back number is [phone].

## 2024-03-14 NOTE — Telephone Encounter (Signed)
 10/16 and 02/02/2024 pt s/p I&D calf abscess. Refill his Oxycodone  yesterday and pt wants to know why the dose has changed from 30 to 15 mg.

## 2024-03-15 NOTE — Telephone Encounter (Signed)
 SW [place], advised her to do a profore dressing on bilateral legs, twice weekly.

## 2024-03-17 NOTE — Telephone Encounter (Signed)
 I called pt and he said he had been given 20 vs 30 tablets. I advised Dr. Duda is starting to wean off medication and that he should take as needed for significant pain. Pt voiced understanding and will call with any questions.

## 2024-03-20 ENCOUNTER — Other Ambulatory Visit: Payer: Self-pay | Admitting: Orthopedic Surgery

## 2024-03-20 ENCOUNTER — Telehealth: Payer: Self-pay | Admitting: Family

## 2024-03-20 MED ORDER — OXYCODONE HCL 15 MG PO TABS: 15.0000 mg | ORAL_TABLET | Freq: Two times a day (BID) | ORAL | 0 refills | Status: DC | PRN

## 2024-03-20 NOTE — Telephone Encounter (Signed)
Patient called. Would like some pain medication called in for him. ?

## 2024-03-24 ENCOUNTER — Ambulatory Visit: Admitting: Physician Assistant

## 2024-03-24 ENCOUNTER — Encounter: Payer: Self-pay | Admitting: Physician Assistant

## 2024-03-24 DIAGNOSIS — S81802D Unspecified open wound, left lower leg, subsequent encounter: Secondary | ICD-10-CM

## 2024-03-24 DIAGNOSIS — Z9889 Other specified postprocedural states: Secondary | ICD-10-CM

## 2024-03-24 DIAGNOSIS — I872 Venous insufficiency (chronic) (peripheral): Secondary | ICD-10-CM

## 2024-03-24 NOTE — Progress Notes (Signed)
 Office Visit Note   Patient: Charles Gray           Date of Birth: 02/16/1956           MRN: [id number] Visit Date: 03/24/2024              Requested by: Sabas Norleen PARAS., MD [address] [place],  [place] [address] PCP: Sabas Norleen PARAS., MD  Chief Complaint  Patient presents with   Left Leg - Routine Post Op    02/02/2024 I&D left leg      HPI: The patient is a 68 year old gentleman who is seen status post irrigation debridement of the left calf.  Surgery was 02/02/24.  He was last seen on 03/08/24.  The wound on the left LE was healing well with 100% granulation.  He has a nurse that checks on him and wanted him to be seen to r/o infection.  He is currently being managed with Vashe wound bed cleaning and compression wraps twice a week with [facility] RN.  He lives at home alone.    Assessment & Plan: Visit Diagnoses:  1. Wound of left lower extremity, subsequent encounter   2. Chronic venous stasis dermatitis of both lower extremities   3. S/P evacuation of hematoma     Plan: The wound bed appears very healthy and has decreased in size.  The wound were cleaned with Vashe and then placed in a compression warp.  We discussed the importance of elevation and he was given a hand out to show proper elevation. I recommended he do the elevation at least 4 times a day in the supine position.    Follow-Up Instructions: Return in about 3 weeks (around 04/14/2024).   Ortho Exam  Patient is alert, oriented, no adenopathy, well-dressed, normal affect, normal respiratory effort. The wound bed is flat with 100 % granulation tissue.  The surrounding skin has no cellulitis or weeping.  The wound measures 23.5 cm x 15 cm.  There are 2 small skin tears medial posterior on the let leg that measure 2 cm x 1 cm flat beefy red base.  He has moderate edema in the left LE with dry scaly skin.      Imaging: No results found. No images are attached to the encounter.  Labs: Lab Results  Component  Value Date   HGBA1C 5.2 10/27/2023   HGBA1C 5.9 (H) 07/10/2021   HGBA1C 5.2 01/28/2020   CRP 1.0 (H) 08/23/2019   LABURIC 7.9 03/10/2022   REPTSTATUS 07/12/2021 FINAL 07/07/2021   CULT  07/07/2021    NO GROWTH 5 DAYS Performed at [facility], [address] [place], [place] [address]    LABORGA STAPHYLOCOCCUS EPIDERMIDIS (A) 07/07/2021   LABORGA ENTEROCOCCUS FAECALIS (A) 07/07/2021     Lab Results  Component Value Date   ALBUMIN  2.9 (L) 02/08/2024   ALBUMIN  2.6 (L) 02/04/2024   ALBUMIN  2.5 (L) 01/29/2024    Lab Results  Component Value Date   MG 1.9 02/04/2024   MG 1.8 11/04/2023   MG 1.8 10/28/2023   No results found for: VD25OH  No results found for: PREALBUMIN    Latest Ref Rng & Units 02/08/2024    1:14 PM 02/08/2024    1:05 PM 02/04/2024    5:29 AM  CBC EXTENDED  WBC 4.0 - 10.5 K/uL  8.4  3.8   RBC 4.22 - 5.81 MIL/uL  3.81  3.26   Hemoglobin 13.0 - 17.0 g/dL 89.0  9.9  8.8   HCT 39.0 - 52.0 % 32.0  32.7  28.0   Platelets 150 - 400 K/uL  176  136   NEUT# 1.7 - 7.7 K/uL  6.9    Lymph# 0.7 - 4.0 K/uL  0.7       There is no height or weight on file to calculate BMI.  Orders:  No orders of the defined types were placed in this encounter.  No orders of the defined types were placed in this encounter.    Procedures: No procedures performed  Clinical Data: No additional findings.  ROS:  All other systems negative, except as noted in the HPI. Review of Systems  Objective: Vital Signs: There were no vitals taken for this visit.  Specialty Comments:  No specialty comments available.  PMFS History: Patient Active Problem List   Diagnosis Date Noted   Leg hematoma 01/27/2024   Wound of left leg 01/27/2024   Fall 10/25/2023   Closed 3-part fracture of proximal end of right humerus 10/25/2023   Leg laceration, right, initial encounter 10/25/2023   Acute on chronic heart failure with preserved ejection fraction (HFpEF) (HCC) 03/04/2022    Dyslipidemia 07/17/2021   Lymphedema 07/08/2021   Melena    Gastric polyps    UGIB (upper gastrointestinal bleed) 04/06/2021   Acute encephalopathy 12/21/2020   COPD (chronic obstructive pulmonary disease) (HCC) 12/21/2020   Chronic diastolic (congestive) heart failure (HCC) 11/14/2020   Syncope 10/28/2020   CHF (congestive heart failure) (HCC) 10/28/2020   Chronic venous stasis dermatitis of both lower extremities 08/27/2020   Cellulitis of both lower extremities 08/14/2020   Iron  deficiency anemia 08/14/2020   GERD without esophagitis 06/10/2020   Atrial fibrillation with rapid ventricular response (HCC) 03/24/2020   Acute on chronic right heart failure (HCC) 01/27/2020   Acute respiratory failure with hypoxia (HCC)    Fatty liver disease, nonalcoholic    Shock (HCC)    Personal history of DVT (deep vein thrombosis) 12/26/2019   Chronic anticoagulation 12/26/2019   Palpitations 12/26/2019   Paroxysmal atrial fibrillation (HCC) 12/23/2019   Heart failure, systolic, acute (HCC) 08/24/2019   Acute on chronic congestive heart failure (HCC)    Acute on chronic diastolic CHF (congestive heart failure) (HCC) 08/23/2019   Chest pain 08/23/2019   Lactic acidosis 08/23/2019   History of pulmonary embolism 08/12/2019   Atrial tachycardia 08/12/2019   Occasional tremors 08/12/2019   OSA (obstructive sleep apnea) 08/12/2019   AKI (acute kidney injury) 08/08/2019   Acute on chronic right-sided heart failure (HCC) 08/06/2019   Acute pulmonary embolism (HCC) 06/08/2019   Normocytic anemia 06/08/2019   Pulmonary emboli (HCC) 06/08/2019   Chronic heart failure with preserved ejection fraction (HCC) 05/30/2019   Class 2 severe obesity due to excess calories with serious comorbidity and body mass index (BMI) of 39.0 to 39.9 in adult 05/30/2019   Morbid obesity with BMI of 40.0-44.9, adult (HCC) 05/30/2019   Hyponatremia    Drug induced constipation    Peripheral edema    Hypotension due  to drugs    Hypoalbuminemia due to protein-calorie malnutrition    Thrombocytopenia    Anemia of chronic disease    Cirrhosis of liver without ascites (HCC)    Chronic pain syndrome    Debility 03/13/2019   Portal hypertensive gastropathy (HCC) 03/06/2019   Dyspnea    GIB (gastrointestinal bleeding) 03/04/2019   Mixed hyperlipidemia 01/24/2019   Insomnia 01/24/2019   Hyperlipidemia 01/24/2019   Essential hypertension 01/24/2019   Lumbar  disc herniation 01/24/2019   Past Medical History:  Diagnosis Date   Acute on chronic congestive heart failure (HCC)    Acute on chronic diastolic CHF (congestive heart failure) (HCC) 08/23/2019   Acute on chronic right-sided heart failure (HCC) 08/06/2019   Acute respiratory failure with hypoxia (HCC)    Anemia of chronic disease    Atrial fibrillation (HCC) 12/23/2019   Atrial tachycardia 08/12/2019   Benign essential HTN    Chest pain 08/23/2019   Chronic anticoagulation 12/26/2019   Chronic heart failure with preserved EF 05/30/2019   Echo 07/2019: EF 55-60, GR 1 DD   Chronic pain syndrome    Cirrhosis of liver without ascites (HCC)    Class 2 severe obesity due to excess calories with serious comorbidity and body mass index (BMI) of 39.0 to 39.9 in adult 05/30/2019   Debility 03/13/2019   Drug induced constipation    Dyspnea    Fatty liver disease, nonalcoholic    GIB (gastrointestinal bleeding) 03/04/2019   Heart failure, systolic, acute (HCC) 08/24/2019   History of pulmonary embolism 08/12/2019   Hyperlipidemia 01/24/2019   Hypertension 01/24/2019   Hypoalbuminemia due to protein-calorie malnutrition    Hypokalemia    Hyponatremia    Hypotension due to drugs    Insomnia 01/24/2019   Lactic acidosis 08/23/2019   Lumbar disc herniation 01/24/2019   Mixed hyperlipidemia 01/24/2019   Morbid obesity with BMI of 40.0-44.9, adult (HCC) 05/30/2019   Normocytic anemia 06/08/2019   Occasional tremors 08/12/2019   OSA (obstructive sleep apnea)  08/12/2019   Palpitations 12/26/2019   Peripheral edema    Personal history of DVT (deep vein thrombosis) 12/26/2019   Portal hypertensive gastropathy (HCC) 03/06/2019   Seen on EGD 03/06/2019   Pulmonary emboli (HCC) 06/08/2019   Pulmonary embolism (HCC) 06/08/2019   Redness and swelling of lower leg 08/23/2019   Shock (HCC)    Thrombocytopenia     Family History  Problem Relation Age of Onset   Hyperlipidemia Mother    Hypertension Mother    Stroke Mother    CAD Maternal Grandmother    CAD Maternal Grandfather     Past Surgical History:  Procedure Laterality Date   APPLICATION OF WOUND VAC Left 01/27/2024   Procedure: APPLICATION, WOUND VAC;  Surgeon: Genelle Standing, MD;  Location[facility];  Service: Orthopedics;  Laterality: Left;   APPLICATION OF WOUND VAC Left 02/02/2024   Procedure: APPLICATION, WOUND VAC;  Surgeon: Harden Jerona GAILS, MD;  Location: [facility] OR;  Service: Orthopedics;  Laterality: Left;   BIOPSY  04/08/2021   Procedure: BIOPSY;  Surgeon: Aneita Gwendlyn DASEN, MD;  Location: [facility] ENDOSCOPY;  Service: Endoscopy;;   ESOPHAGOGASTRODUODENOSCOPY N/A 04/08/2021   Procedure: ESOPHAGOGASTRODUODENOSCOPY (EGD);  Surgeon: Aneita Gwendlyn DASEN, MD;  Location: [facility] ENDOSCOPY;  Service: Endoscopy;  Laterality: N/A;   ESOPHAGOGASTRODUODENOSCOPY (EGD) WITH PROPOFOL  N/A 03/05/2019   Procedure: ESOPHAGOGASTRODUODENOSCOPY (EGD) WITH PROPOFOL ;  Surgeon: Legrand Victory LITTIE DOUGLAS, MD;  Location: [facility] ENDOSCOPY;  Service: Gastroenterology;  Laterality: N/A;  Large Blood Clot Removed   ESOPHAGOGASTRODUODENOSCOPY (EGD) WITH PROPOFOL  N/A 03/06/2019   Procedure: ESOPHAGOGASTRODUODENOSCOPY (EGD) WITH PROPOFOL ;  Surgeon: Eda Iha, MD;  Location: [facility] ENDOSCOPY;  Service: Gastroenterology;  Laterality: N/A;   HEMORROIDECTOMY     INCISION AND DRAINAGE OF DEEP ABSCESS, CALF Left 02/02/2024   Procedure: INCISION AND DRAINAGE OF DEEP ABSCESS, CALF;  Surgeon: Harden Jerona GAILS, MD;  Location: [facility] OR;  Service: Orthopedics;   Laterality: Left;  LEFT LEG DEBRIDEMENT   INCISION  AND DRAINAGE OF WOUND Left 01/27/2024   Procedure: IRRIGATION AND DEBRIDEMENT WOUND;  Surgeon: Genelle Standing, MD;  Location: [facility] OR;  Service: Orthopedics;  Laterality: Left;   PRESSURE SENSOR/CARDIOMEMS N/A 11/14/2020   Procedure: PRESSURE SENSOR/CARDIOMEMS;  Surgeon: Rolan Ezra RAMAN, MD;  Location: [facility] INVASIVE CV LAB;  Service: Cardiovascular;  Laterality: N/A;   RIGHT HEART CATH N/A 08/09/2019   Procedure: RIGHT HEART CATH;  Surgeon: Rolan Ezra RAMAN, MD;  Location: [facility] INVASIVE CV LAB;  Service: Cardiovascular;  Laterality: N/A;   RIGHT HEART CATH N/A 11/01/2020   Procedure: RIGHT HEART CATH;  Surgeon: Rolan Ezra RAMAN, MD;  Location: [facility] INVASIVE CV LAB;  Service: Cardiovascular;  Laterality: N/A;   RIGHT HEART CATH N/A 11/14/2020   Procedure: RIGHT HEART CATH;  Surgeon: Rolan Ezra RAMAN, MD;  Location: [facility] INVASIVE CV LAB;  Service: Cardiovascular;  Laterality: N/A;   RIGHT/LEFT HEART CATH AND CORONARY ANGIOGRAPHY N/A 05/10/2020   Procedure: RIGHT/LEFT HEART CATH AND CORONARY ANGIOGRAPHY;  Surgeon: Rolan Ezra RAMAN, MD;  Location: [facility] INVASIVE CV LAB;  Service: Cardiovascular;  Laterality: N/A;   Social History   Occupational History   Not on file  Tobacco Use   Smoking status: Never   Smokeless tobacco: Never  Vaping Use   Vaping status: Never Used  Substance and Sexual Activity   Alcohol use: Not Currently   Drug use: Not Currently    Types: Marijuana    Comment: Smoked for 30 years   Sexual activity: Not on file

## 2024-03-29 ENCOUNTER — Other Ambulatory Visit: Payer: Self-pay | Admitting: Orthopedic Surgery

## 2024-04-04 ENCOUNTER — Other Ambulatory Visit: Payer: Self-pay | Admitting: Orthopedic Surgery

## 2024-04-04 ENCOUNTER — Telehealth: Payer: Self-pay | Admitting: Orthopedic Surgery

## 2024-04-04 ENCOUNTER — Other Ambulatory Visit: Payer: Self-pay | Admitting: Physician Assistant

## 2024-04-04 MED ORDER — TRAMADOL HCL 50 MG PO TABS: 50.0000 mg | ORAL_TABLET | Freq: Two times a day (BID) | ORAL | 0 refills | Status: DC | PRN

## 2024-04-04 NOTE — Telephone Encounter (Signed)
 Brittany BANKER) from [employer] called stating been using Pro 4 wraps and need to know what to use over it to cover wound, zero form or ABD? Brittany secure number is [phone].

## 2024-04-05 ENCOUNTER — Other Ambulatory Visit: Payer: Self-pay | Admitting: Orthopaedic Surgery

## 2024-04-05 ENCOUNTER — Telehealth: Payer: Self-pay | Admitting: Physician Assistant

## 2024-04-05 MED ORDER — OXYCODONE HCL 15 MG PO TABS: 15.0000 mg | ORAL_TABLET | Freq: Two times a day (BID) | ORAL | 0 refills | Status: DC | PRN

## 2024-04-05 NOTE — Telephone Encounter (Signed)
 Pt called stated that the wrong medication. Tramadol  does not work for him. Please call pt at [phone]. Pt is a [facility] pt and states he gets oxycodone  and can't go into [facility] in pain. Please call pt about this matter. Looks like PA Foraker sent in script due to [place] staff being off. Pt is asking for a call back as soon as possible.

## 2024-04-05 NOTE — Telephone Encounter (Signed)
 Can you please advise on message since [place] team is off.

## 2024-04-10 NOTE — Telephone Encounter (Signed)
 Order placed for vashe in parachute for [facility]  to hopefully supply.

## 2024-04-14 ENCOUNTER — Other Ambulatory Visit: Payer: Self-pay | Admitting: Orthopaedic Surgery

## 2024-04-14 ENCOUNTER — Ambulatory Visit: Admitting: Physician Assistant

## 2024-04-14 NOTE — Progress Notes (Deleted)
 "  Office Visit Note   Patient: Charles Gray           Date of Birth: January 11, 1956           MRN: [id number] Visit Date: 04/14/2024              Requested by: Sabas Norleen PARAS., MD [address] [place],  [place] [address] PCP: Sabas Norleen PARAS., MD  No chief complaint on file.     HPI: The patient is a 69 year old gentleman who is seen status post irrigation debridement of the left calf.  Surgery was 02/02/24.  He is currently being managed with Vashe wound bed cleaning and compression wraps twice a week with [facility] RN. He lives at home alone.    Assessment & Plan: Visit Diagnoses: No diagnosis found.  Plan: ***  Follow-Up Instructions: No follow-ups on file.   Ortho Exam  Patient is alert, oriented, no adenopathy, well-dressed, normal affect, normal respiratory effort. ***    Imaging: No results found. No images are attached to the encounter.  Labs: Lab Results  Component Value Date   HGBA1C 5.2 10/27/2023   HGBA1C 5.9 (H) 07/10/2021   HGBA1C 5.2 01/28/2020   CRP 1.0 (H) 08/23/2019   LABURIC 7.9 03/10/2022   REPTSTATUS 07/12/2021 FINAL 07/07/2021   CULT  07/07/2021    NO GROWTH 5 DAYS Performed at [facility], [address] [place], [place] [address]    LABORGA STAPHYLOCOCCUS EPIDERMIDIS (A) 07/07/2021   LABORGA ENTEROCOCCUS FAECALIS (A) 07/07/2021     Lab Results  Component Value Date   ALBUMIN  2.9 (L) 02/08/2024   ALBUMIN  2.6 (L) 02/04/2024   ALBUMIN  2.5 (L) 01/29/2024    Lab Results  Component Value Date   MG 1.9 02/04/2024   MG 1.8 11/04/2023   MG 1.8 10/28/2023   No results found for: VD25OH  No results found for: PREALBUMIN    Latest Ref Rng & Units 02/08/2024    1:14 PM 02/08/2024    1:05 PM 02/04/2024    5:29 AM  CBC EXTENDED  WBC 4.0 - 10.5 K/uL  8.4  3.8   RBC 4.22 - 5.81 MIL/uL  3.81  3.26   Hemoglobin 13.0 - 17.0 g/dL 89.0  9.9  8.8   HCT 60.9 - 52.0 % 32.0  32.7  28.0   Platelets 150 - 400 K/uL  176  136   NEUT#  1.7 - 7.7 K/uL  6.9    Lymph# 0.7 - 4.0 K/uL  0.7       There is no height or weight on file to calculate BMI.  Orders:  No orders of the defined types were placed in this encounter.  No orders of the defined types were placed in this encounter.    Procedures: No procedures performed  Clinical Data: No additional findings.  ROS:  All other systems negative, except as noted in the HPI. Review of Systems  Objective: Vital Signs: There were no vitals taken for this visit.  Specialty Comments:  No specialty comments available.  PMFS History: Patient Active Problem List   Diagnosis Date Noted   Leg hematoma 01/27/2024   Wound of left leg 01/27/2024   Fall 10/25/2023   Closed 3-part fracture of proximal end of right humerus 10/25/2023   Leg laceration, right, initial encounter 10/25/2023   Acute on chronic heart failure with preserved ejection fraction (HFpEF) (HCC) 03/04/2022   Dyslipidemia 07/17/2021   Lymphedema 07/08/2021   Melena  Gastric polyps    UGIB (upper gastrointestinal bleed) 04/06/2021   Acute encephalopathy 12/21/2020   COPD (chronic obstructive pulmonary disease) (HCC) 12/21/2020   Chronic diastolic (congestive) heart failure (HCC) 11/14/2020   Syncope 10/28/2020   CHF (congestive heart failure) (HCC) 10/28/2020   Chronic venous stasis dermatitis of both lower extremities 08/27/2020   Cellulitis of both lower extremities 08/14/2020   Iron  deficiency anemia 08/14/2020   GERD without esophagitis 06/10/2020   Atrial fibrillation with rapid ventricular response (HCC) 03/24/2020   Acute on chronic right heart failure (HCC) 01/27/2020   Acute respiratory failure with hypoxia (HCC)    Fatty liver disease, nonalcoholic    Shock (HCC)    Personal history of DVT (deep vein thrombosis) 12/26/2019   Chronic anticoagulation 12/26/2019   Palpitations 12/26/2019   Paroxysmal atrial fibrillation (HCC) 12/23/2019   Heart failure, systolic, acute (HCC)  08/24/2019   Acute on chronic congestive heart failure (HCC)    Acute on chronic diastolic CHF (congestive heart failure) (HCC) 08/23/2019   Chest pain 08/23/2019   Lactic acidosis 08/23/2019   History of pulmonary embolism 08/12/2019   Atrial tachycardia 08/12/2019   Occasional tremors 08/12/2019   OSA (obstructive sleep apnea) 08/12/2019   AKI (acute kidney injury) 08/08/2019   Acute on chronic right-sided heart failure (HCC) 08/06/2019   Acute pulmonary embolism (HCC) 06/08/2019   Normocytic anemia 06/08/2019   Pulmonary emboli (HCC) 06/08/2019   Chronic heart failure with preserved ejection fraction (HCC) 05/30/2019   Class 2 severe obesity due to excess calories with serious comorbidity and body mass index (BMI) of 39.0 to 39.9 in adult 05/30/2019   Morbid obesity with BMI of 40.0-44.9, adult (HCC) 05/30/2019   Hyponatremia    Drug induced constipation    Peripheral edema    Hypotension due to drugs    Hypoalbuminemia due to protein-calorie malnutrition    Thrombocytopenia    Anemia of chronic disease    Cirrhosis of liver without ascites (HCC)    Chronic pain syndrome    Debility 03/13/2019   Portal hypertensive gastropathy (HCC) 03/06/2019   Dyspnea    GIB (gastrointestinal bleeding) 03/04/2019   Mixed hyperlipidemia 01/24/2019   Insomnia 01/24/2019   Hyperlipidemia 01/24/2019   Essential hypertension 01/24/2019   Lumbar disc herniation 01/24/2019   Past Medical History:  Diagnosis Date   Acute on chronic congestive heart failure (HCC)    Acute on chronic diastolic CHF (congestive heart failure) (HCC) 08/23/2019   Acute on chronic right-sided heart failure (HCC) 08/06/2019   Acute respiratory failure with hypoxia (HCC)    Anemia of chronic disease    Atrial fibrillation (HCC) 12/23/2019   Atrial tachycardia 08/12/2019   Benign essential HTN    Chest pain 08/23/2019   Chronic anticoagulation 12/26/2019   Chronic heart failure with preserved EF 05/30/2019   Echo 07/2019:  EF 55-60, GR 1 DD   Chronic pain syndrome    Cirrhosis of liver without ascites (HCC)    Class 2 severe obesity due to excess calories with serious comorbidity and body mass index (BMI) of 39.0 to 39.9 in adult 05/30/2019   Debility 03/13/2019   Drug induced constipation    Dyspnea    Fatty liver disease, nonalcoholic    GIB (gastrointestinal bleeding) 03/04/2019   Heart failure, systolic, acute (HCC) 08/24/2019   History of pulmonary embolism 08/12/2019   Hyperlipidemia 01/24/2019   Hypertension 01/24/2019   Hypoalbuminemia due to protein-calorie malnutrition    Hypokalemia    Hyponatremia  Hypotension due to drugs    Insomnia 01/24/2019   Lactic acidosis 08/23/2019   Lumbar disc herniation 01/24/2019   Mixed hyperlipidemia 01/24/2019   Morbid obesity with BMI of 40.0-44.9, adult (HCC) 05/30/2019   Normocytic anemia 06/08/2019   Occasional tremors 08/12/2019   OSA (obstructive sleep apnea) 08/12/2019   Palpitations 12/26/2019   Peripheral edema    Personal history of DVT (deep vein thrombosis) 12/26/2019   Portal hypertensive gastropathy (HCC) 03/06/2019   Seen on EGD 03/06/2019   Pulmonary emboli (HCC) 06/08/2019   Pulmonary embolism (HCC) 06/08/2019   Redness and swelling of lower leg 08/23/2019   Shock (HCC)    Thrombocytopenia     Family History  Problem Relation Age of Onset   Hyperlipidemia Mother    Hypertension Mother    Stroke Mother    CAD Maternal Grandmother    CAD Maternal Grandfather     Past Surgical History:  Procedure Laterality Date   APPLICATION OF WOUND VAC Left 01/27/2024   Procedure: APPLICATION, WOUND VAC;  Surgeon: Genelle Standing, MD;  Location: [facility] OR;  Service: Orthopedics;  Laterality: Left;   APPLICATION OF WOUND VAC Left 02/02/2024   Procedure: APPLICATION, WOUND VAC;  Surgeon: Harden Jerona GAILS, MD;  Location: [facility] OR;  Service: Orthopedics;  Laterality: Left;   BIOPSY  04/08/2021   Procedure: BIOPSY;  Surgeon: Aneita Gwendlyn DASEN, MD;  Location: [facility]  ENDOSCOPY;  Service: Endoscopy;;   ESOPHAGOGASTRODUODENOSCOPY N/A 04/08/2021   Procedure: ESOPHAGOGASTRODUODENOSCOPY (EGD);  Surgeon: Aneita Gwendlyn DASEN, MD;  Location: [facility] ENDOSCOPY;  Service: Endoscopy;  Laterality: N/A;   ESOPHAGOGASTRODUODENOSCOPY (EGD) WITH PROPOFOL  N/A 03/05/2019   Procedure: ESOPHAGOGASTRODUODENOSCOPY (EGD) WITH PROPOFOL ;  Surgeon: Legrand Victory LITTIE DOUGLAS, MD;  Location: [facility] ENDOSCOPY;  Service: Gastroenterology;  Laterality: N/A;  Large Blood Clot Removed   ESOPHAGOGASTRODUODENOSCOPY (EGD) WITH PROPOFOL  N/A 03/06/2019   Procedure: ESOPHAGOGASTRODUODENOSCOPY (EGD) WITH PROPOFOL ;  Surgeon: Eda Iha, MD;  Location: [facility] ENDOSCOPY;  Service: Gastroenterology;  Laterality: N/A;   HEMORROIDECTOMY     INCISION AND DRAINAGE OF DEEP ABSCESS, CALF Left 02/02/2024   Procedure: INCISION AND DRAINAGE OF DEEP ABSCESS, CALF;  Surgeon: Harden Jerona GAILS, MD;  Location: [facility] OR;  Service: Orthopedics;  Laterality: Left;  LEFT LEG DEBRIDEMENT   INCISION AND DRAINAGE OF WOUND Left 01/27/2024   Procedure: IRRIGATION AND DEBRIDEMENT WOUND;  Surgeon: Genelle Standing, MD;  Location: [facility] OR;  Service: Orthopedics;  Laterality: Left;   PRESSURE SENSOR/CARDIOMEMS N/A 11/14/2020   Procedure: PRESSURE SENSOR/CARDIOMEMS;  Surgeon: Rolan Ezra RAMAN, MD;  Location: [facility] INVASIVE CV LAB;  Service: Cardiovascular;  Laterality: N/A;   RIGHT HEART CATH N/A 08/09/2019   Procedure: RIGHT HEART CATH;  Surgeon: Rolan Ezra RAMAN, MD;  Location: [facility] INVASIVE CV LAB;  Service: Cardiovascular;  Laterality: N/A;   RIGHT HEART CATH N/A 11/01/2020   Procedure: RIGHT HEART CATH;  Surgeon: Rolan Ezra RAMAN, MD;  Location: [facility] INVASIVE CV LAB;  Service: Cardiovascular;  Laterality: N/A;   RIGHT HEART CATH N/A 11/14/2020   Procedure: RIGHT HEART CATH;  Surgeon: Rolan Ezra RAMAN, MD;  Location: [facility] INVASIVE CV LAB;  Service: Cardiovascular;  Laterality: N/A;   RIGHT/LEFT HEART CATH AND CORONARY ANGIOGRAPHY N/A 05/10/2020   Procedure:  RIGHT/LEFT HEART CATH AND CORONARY ANGIOGRAPHY;  Surgeon: Rolan Ezra RAMAN, MD;  Location: [facility] INVASIVE CV LAB;  Service: Cardiovascular;  Laterality: N/A;   Social History   Occupational History   Not on file  Tobacco Use   Smoking status: Never   Smokeless tobacco:  Never  Vaping Use   Vaping status: Never Used  Substance and Sexual Activity   Alcohol use: Not Currently   Drug use: Not Currently    Types: Marijuana    Comment: Smoked for 30 years   Sexual activity: Not on file       "

## 2024-04-21 ENCOUNTER — Ambulatory Visit: Admitting: Physician Assistant

## 2024-04-21 DIAGNOSIS — Z9889 Other specified postprocedural states: Secondary | ICD-10-CM

## 2024-04-21 DIAGNOSIS — S81802D Unspecified open wound, left lower leg, subsequent encounter: Secondary | ICD-10-CM

## 2024-04-21 DIAGNOSIS — I872 Venous insufficiency (chronic) (peripheral): Secondary | ICD-10-CM

## 2024-04-21 NOTE — Progress Notes (Unsigned)
 "  Office Visit Note   Patient: Charles Gray           Date of Birth: 1955/07/24           MRN: [id number] Visit Date: 04/21/2024              Requested by: Sabas Norleen PARAS., MD [address] [place],  [place] [address] PCP: Sabas Norleen PARAS., MD  Chief Complaint  Patient presents with   Left Leg - Routine Post Op    02/02/2024 I&D left leg      HPI: 69 y/o male with left   Assessment & Plan: Visit Diagnoses: No diagnosis found.  Plan: ***  Follow-Up Instructions: No follow-ups on file.   Ortho Exam  Patient is alert, oriented, no adenopathy, well-dressed, normal affect, normal respiratory effort. ***    Imaging: No results found. No images are attached to the encounter.  Labs: Lab Results  Component Value Date   HGBA1C 5.2 10/27/2023   HGBA1C 5.9 (H) 07/10/2021   HGBA1C 5.2 01/28/2020   CRP 1.0 (H) 08/23/2019   LABURIC 7.9 03/10/2022   REPTSTATUS 07/12/2021 FINAL 07/07/2021   CULT  07/07/2021    NO GROWTH 5 DAYS Performed at [facility], [address], [place], [place] [address]    LABORGA STAPHYLOCOCCUS EPIDERMIDIS (A) 07/07/2021   LABORGA ENTEROCOCCUS FAECALIS (A) 07/07/2021     Lab Results  Component Value Date   ALBUMIN  2.9 (L) 02/08/2024   ALBUMIN  2.6 (L) 02/04/2024   ALBUMIN  2.5 (L) 01/29/2024    Lab Results  Component Value Date   MG 1.9 02/04/2024   MG 1.8 11/04/2023   MG 1.8 10/28/2023   No results found for: VD25OH  No results found for: PREALBUMIN    Latest Ref Rng & Units 02/08/2024    1:14 PM 02/08/2024    1:05 PM 02/04/2024    5:29 AM  CBC EXTENDED  WBC 4.0 - 10.5 K/uL  8.4  3.8   RBC 4.22 - 5.81 MIL/uL  3.81  3.26   Hemoglobin 13.0 - 17.0 g/dL 89.0  9.9  8.8   HCT 60.9 - 52.0 % 32.0  32.7  28.0   Platelets 150 - 400 K/uL  176  136   NEUT# 1.7 - 7.7 K/uL  6.9    Lymph# 0.7 - 4.0 K/uL  0.7       There is no height or weight on file to calculate BMI.  Orders:  No orders of the defined types were  placed in this encounter.  No orders of the defined types were placed in this encounter.    Procedures: No procedures performed  Clinical Data: No additional findings.  ROS:  All other systems negative, except as noted in the HPI. Review of Systems  Objective: Vital Signs: There were no vitals taken for this visit.  Specialty Comments:  No specialty comments available.  PMFS History: Patient Active Problem List   Diagnosis Date Noted   Leg hematoma 01/27/2024   Wound of left leg 01/27/2024   Fall 10/25/2023   Closed 3-part fracture of proximal end of right humerus 10/25/2023   Leg laceration, right, initial encounter 10/25/2023   Acute on chronic heart failure with preserved ejection fraction (HFpEF) (HCC) 03/04/2022   Dyslipidemia 07/17/2021   Lymphedema 07/08/2021   Melena    Gastric polyps    UGIB (upper gastrointestinal bleed) 04/06/2021   Acute encephalopathy 12/21/2020   COPD (chronic obstructive pulmonary disease) (HCC) 12/21/2020  Chronic diastolic (congestive) heart failure (HCC) 11/14/2020   Syncope 10/28/2020   CHF (congestive heart failure) (HCC) 10/28/2020   Chronic venous stasis dermatitis of both lower extremities 08/27/2020   Cellulitis of both lower extremities 08/14/2020   Iron  deficiency anemia 08/14/2020   GERD without esophagitis 06/10/2020   Atrial fibrillation with rapid ventricular response (HCC) 03/24/2020   Acute on chronic right heart failure (HCC) 01/27/2020   Acute respiratory failure with hypoxia (HCC)    Fatty liver disease, nonalcoholic    Shock (HCC)    Personal history of DVT (deep vein thrombosis) 12/26/2019   Chronic anticoagulation 12/26/2019   Palpitations 12/26/2019   Paroxysmal atrial fibrillation (HCC) 12/23/2019   Heart failure, systolic, acute (HCC) 08/24/2019   Acute on chronic congestive heart failure (HCC)    Acute on chronic diastolic CHF (congestive heart failure) (HCC) 08/23/2019    Chest pain 08/23/2019   Lactic acidosis 08/23/2019   History of pulmonary embolism 08/12/2019   Atrial tachycardia 08/12/2019   Occasional tremors 08/12/2019   OSA (obstructive sleep apnea) 08/12/2019   AKI (acute kidney injury) 08/08/2019   Acute on chronic right-sided heart failure (HCC) 08/06/2019   Acute pulmonary embolism (HCC) 06/08/2019   Normocytic anemia 06/08/2019   Pulmonary emboli (HCC) 06/08/2019   Chronic heart failure with preserved ejection fraction (HCC) 05/30/2019   Class 2 severe obesity due to excess calories with serious comorbidity and body mass index (BMI) of 39.0 to 39.9 in adult 05/30/2019   Morbid obesity with BMI of 40.0-44.9, adult (HCC) 05/30/2019   Hyponatremia    Drug induced constipation    Peripheral edema    Hypotension due to drugs    Hypoalbuminemia due to protein-calorie malnutrition    Thrombocytopenia    Anemia of chronic disease    Cirrhosis of liver without ascites (HCC)    Chronic pain syndrome    Debility 03/13/2019   Portal hypertensive gastropathy (HCC) 03/06/2019   Dyspnea    GIB (gastrointestinal bleeding) 03/04/2019   Mixed hyperlipidemia 01/24/2019   Insomnia 01/24/2019   Hyperlipidemia 01/24/2019   Essential hypertension 01/24/2019   Lumbar disc herniation 01/24/2019   Past Medical History:  Diagnosis Date   Acute on chronic congestive heart failure (HCC)    Acute on chronic diastolic CHF (congestive heart failure) (HCC) 08/23/2019   Acute on chronic right-sided heart failure (HCC) 08/06/2019   Acute respiratory failure with hypoxia (HCC)    Anemia of chronic disease    Atrial fibrillation (HCC) 12/23/2019   Atrial tachycardia 08/12/2019   Benign essential HTN    Chest pain 08/23/2019   Chronic anticoagulation 12/26/2019   Chronic heart failure with preserved EF 05/30/2019   Echo 07/2019: EF 55-60, GR 1 DD   Chronic pain syndrome    Cirrhosis of liver without ascites (HCC)     Class 2 severe obesity due to excess calories with serious comorbidity and body mass index (BMI) of 39.0 to 39.9 in adult 05/30/2019   Debility 03/13/2019   Drug induced constipation    Dyspnea    Fatty liver disease, nonalcoholic    GIB (gastrointestinal bleeding) 03/04/2019   Heart failure, systolic, acute (HCC) 08/24/2019   History of pulmonary embolism 08/12/2019   Hyperlipidemia 01/24/2019   Hypertension 01/24/2019   Hypoalbuminemia due to protein-calorie malnutrition    Hypokalemia    Hyponatremia    Hypotension due to drugs    Insomnia 01/24/2019   Lactic acidosis 08/23/2019   Lumbar disc herniation 01/24/2019   Mixed hyperlipidemia 01/24/2019  Morbid obesity with BMI of 40.0-44.9, adult (HCC) 05/30/2019   Normocytic anemia 06/08/2019   Occasional tremors 08/12/2019   OSA (obstructive sleep apnea) 08/12/2019   Palpitations 12/26/2019   Peripheral edema    Personal history of DVT (deep vein thrombosis) 12/26/2019   Portal hypertensive gastropathy (HCC) 03/06/2019   Seen on EGD 03/06/2019   Pulmonary emboli (HCC) 06/08/2019   Pulmonary embolism (HCC) 06/08/2019   Redness and swelling of lower leg 08/23/2019   Shock (HCC)    Thrombocytopenia     Family History  Problem Relation Age of Onset   Hyperlipidemia Mother    Hypertension Mother    Stroke Mother    CAD Maternal Grandmother    CAD Maternal Grandfather     Past Surgical History:  Procedure Laterality Date   APPLICATION OF WOUND VAC Left 01/27/2024   Procedure: APPLICATION, WOUND VAC;  Surgeon: Genelle Standing, MD;  Location: [facility] OR;  Service: Orthopedics;  Laterality: Left;   APPLICATION OF WOUND VAC Left 02/02/2024   Procedure: APPLICATION, WOUND VAC;  Surgeon: Harden Jerona GAILS, MD;  Location: [facility] OR;  Service: Orthopedics;  Laterality: Left;   BIOPSY  04/08/2021   Procedure: BIOPSY;  Surgeon: Aneita Gwendlyn DASEN, MD;  Location: [facility] ENDOSCOPY;  Service: Endoscopy;;    ESOPHAGOGASTRODUODENOSCOPY N/A 04/08/2021   Procedure: ESOPHAGOGASTRODUODENOSCOPY (EGD);  Surgeon: Aneita Gwendlyn DASEN, MD;  Location: [facility] ENDOSCOPY;  Service: Endoscopy;  Laterality: N/A;   ESOPHAGOGASTRODUODENOSCOPY (EGD) WITH PROPOFOL  N/A 03/05/2019   Procedure: ESOPHAGOGASTRODUODENOSCOPY (EGD) WITH PROPOFOL ;  Surgeon: Legrand Victory LITTIE DOUGLAS, MD;  Location: [facility] ENDOSCOPY;  Service: Gastroenterology;  Laterality: N/A;  Large Blood Clot Removed   ESOPHAGOGASTRODUODENOSCOPY (EGD) WITH PROPOFOL  N/A 03/06/2019   Procedure: ESOPHAGOGASTRODUODENOSCOPY (EGD) WITH PROPOFOL ;  Surgeon: Eda Iha, MD;  Location: [facility] ENDOSCOPY;  Service: Gastroenterology;  Laterality: N/A;   HEMORROIDECTOMY     INCISION AND DRAINAGE OF DEEP ABSCESS, CALF Left 02/02/2024   Procedure: INCISION AND DRAINAGE OF DEEP ABSCESS, CALF;  Surgeon: Harden Jerona GAILS, MD;  Location: [facility];  Service: Orthopedics;  Laterality: Left;  LEFT LEG DEBRIDEMENT   INCISION AND DRAINAGE OF WOUND Left 01/27/2024   Procedure: IRRIGATION AND DEBRIDEMENT WOUND;  Surgeon: Genelle Standing, MD;  Location: [facility];  Service: Orthopedics;  Laterality: Left;   PRESSURE SENSOR/CARDIOMEMS N/A 11/14/2020   Procedure: PRESSURE SENSOR/CARDIOMEMS;  Surgeon: Rolan Ezra RAMAN, MD;  Location: [facility] INVASIVE CV LAB;  Service: Cardiovascular;  Laterality: N/A;   RIGHT HEART CATH N/A 08/09/2019   Procedure: RIGHT HEART CATH;  Surgeon: Rolan Ezra RAMAN, MD;  Location: [facility] INVASIVE CV LAB;  Service: Cardiovascular;  Laterality: N/A;   RIGHT HEART CATH N/A 11/01/2020   Procedure: RIGHT HEART CATH;  Surgeon: Rolan Ezra RAMAN, MD;  Location: [facility] INVASIVE CV LAB;  Service: Cardiovascular;  Laterality: N/A;   RIGHT HEART CATH N/A 11/14/2020   Procedure: RIGHT HEART CATH;  Surgeon: Rolan Ezra RAMAN, MD;  Location: [facility] INVASIVE CV LAB;  Service: Cardiovascular;  Laterality: N/A;   RIGHT/LEFT HEART CATH AND CORONARY ANGIOGRAPHY N/A 05/10/2020   Procedure: RIGHT/LEFT HEART CATH AND  CORONARY ANGIOGRAPHY;  Surgeon: Rolan Ezra RAMAN, MD;  Location: [facility] INVASIVE CV LAB;  Service: Cardiovascular;  Laterality: N/A;   Social History   Occupational History   Not on file  Tobacco Use   Smoking status: Never   Smokeless tobacco: Never  Vaping Use   Vaping status: Never Used  Substance and Sexual Activity   Alcohol use: Not Currently   Drug use: Not Currently  Types: Marijuana    Comment: Smoked for 30 years   Sexual activity: Not on file       "

## 2024-04-23 ENCOUNTER — Encounter: Payer: Self-pay | Admitting: Physician Assistant

## 2024-04-25 ENCOUNTER — Other Ambulatory Visit: Payer: Self-pay | Admitting: Physician Assistant

## 2024-04-25 ENCOUNTER — Other Ambulatory Visit: Payer: Self-pay | Admitting: Orthopedic Surgery

## 2024-04-25 ENCOUNTER — Telehealth: Payer: Self-pay | Admitting: Orthopedic Surgery

## 2024-04-25 DIAGNOSIS — Z9889 Other specified postprocedural states: Secondary | ICD-10-CM

## 2024-04-25 MED ORDER — OXYCODONE HCL 5 MG PO CAPS: 15.0000 mg | ORAL_CAPSULE | Freq: Two times a day (BID) | ORAL | 0 refills | Status: DC | PRN

## 2024-04-25 NOTE — Telephone Encounter (Signed)
 Pt called saying that his pharmacy called his prescription in yesterday and hasn't heard back. I made him aware that you have 24-48 hours to respond. He says that he is out of his medication. Pharmacy is [facility]. Call back number is [phone].

## 2024-04-26 ENCOUNTER — Other Ambulatory Visit: Payer: Self-pay | Admitting: Physician Assistant

## 2024-04-26 ENCOUNTER — Telehealth: Payer: Self-pay

## 2024-04-26 DIAGNOSIS — Z9889 Other specified postprocedural states: Secondary | ICD-10-CM

## 2024-04-26 MED ORDER — OXYCODONE HCL 15 MG PO TABS: 15.0000 mg | ORAL_TABLET | Freq: Two times a day (BID) | ORAL | 0 refills | Status: DC | PRN

## 2024-04-26 NOTE — Telephone Encounter (Signed)
 This message was put in the incorrect chart by the front desk   Pt called saying that the pharmacy received 2 different prescriptions for his Oxycodone .  They won't fill either of them until they get clarification on which one is the right one.  Patient says the right one is Oxycodone  2xd for 10d. Pharmacy is [facility].  Call back number is [phone].    Looks like you put in rx yesterday for this pt please advise.

## 2024-05-04 ENCOUNTER — Ambulatory Visit: Admitting: Physician Assistant

## 2024-05-04 ENCOUNTER — Encounter: Payer: Self-pay | Admitting: Physician Assistant

## 2024-05-04 DIAGNOSIS — Z9889 Other specified postprocedural states: Secondary | ICD-10-CM

## 2024-05-04 DIAGNOSIS — G8929 Other chronic pain: Secondary | ICD-10-CM

## 2024-05-04 MED ORDER — OXYCODONE HCL 15 MG PO TABS: 15.0000 mg | ORAL_TABLET | Freq: Three times a day (TID) | ORAL | 0 refills | Status: DC | PRN

## 2024-05-04 NOTE — Progress Notes (Signed)
 "  Office Visit Note   Patient: Charles Gray           Date of Birth: February 21, 1956           MRN: [id number] Visit Date: 05/04/2024              Requested by: Sabas Norleen PARAS., MD [address] [place],  [place] [address] PCP: Sabas Norleen PARAS., MD  Chief Complaint  Patient presents with   Left Leg - Wound Check      HPI: 69 y/o male with left left swelling presented to the ED on 01/18/24.  He was admitted with Left LE hematoma.  His Eliqus was put on hold and an ace wrap was used for compression.  Non operative care.  He was discharged with Hematoma blister intact back to the SNF facility.  He returned to the ED on 01/27/24 after the Hematoma ruptured.  Dr. Genelle was consulted and he was taken to the OR for Irrigation and debridement subcutaneous tissue including fascia wound measuring 50 x 10 cm.  Dr. Harden was then consulted and returned him to the OR Irrigation and debridement and Application Kerecis micro graft 95 cm times was done by Dr. Harden on  02/02/24.        Assessment & Plan: Visit Diagnoses:  1. Other chronic pain   2. S/P evacuation of hematoma     Plan: Requesting Kerecis application for left medial leg wound.    > 3.5 months lateral leg wound after large Hematoma Despite these surgical intervention with current care of Vashe wound bed cleaning and compression wraps weekly.  To prevent the need for more invasive surgical debridement and hospitalization, I feel that more advance treatment is indicated in the form of skin substitute application.  The patient has been compliant with elevation and compression.  In my medical opinion that to optimize the chance of healing and minimize the risk we recommend Kerecis tissue application.    He is very disabled and has multiple pain issues.  The left leg wound is painful.  He also has Chronic LE lymphedema with pain, shoulder rotator cuff pain, cervical algia.  He is currently requiring Oxy IR 15 mg q 8 hours.  He is not  currently ambulatory due to pain issue and mobility.    Follow-Up Instructions: Return in about 1 week (around 05/11/2024).   Ortho Exam  Patient is alert, oriented, no adenopathy, well-dressed, normal affect, normal respiratory effort. The wound bed has yellow film.  I debrided the wound wound bed with Vashe and 4 x 4.  He has 100 % granulation tissue.  The wound size has not changed.  It measures 21 cm x 12.5 cm.  Chronic edema B LE when in a dependent position.      Imaging: No results found. No images are attached to the encounter.  Labs: Lab Results  Component Value Date   HGBA1C 5.2 10/27/2023   HGBA1C 5.9 (H) 07/10/2021   HGBA1C 5.2 01/28/2020   CRP 1.0 (H) 08/23/2019   LABURIC 7.9 03/10/2022   REPTSTATUS 07/12/2021 FINAL 07/07/2021   CULT  07/07/2021    NO GROWTH 5 DAYS Performed at [facility], [address], [place], [place] [address]    LABORGA STAPHYLOCOCCUS EPIDERMIDIS (A) 07/07/2021   LABORGA ENTEROCOCCUS FAECALIS (A) 07/07/2021     Lab Results  Component Value Date   ALBUMIN  2.9 (L) 02/08/2024   ALBUMIN  2.6 (L) 02/04/2024   ALBUMIN  2.5 (L) 01/29/2024  Lab Results  Component Value Date   MG 1.9 02/04/2024   MG 1.8 11/04/2023   MG 1.8 10/28/2023   No results found for: VD25OH  No results found for: PREALBUMIN    Latest Ref Rng & Units 02/08/2024    1:14 PM 02/08/2024    1:05 PM 02/04/2024    5:29 AM  CBC EXTENDED  WBC 4.0 - 10.5 K/uL  8.4  3.8   RBC 4.22 - 5.81 MIL/uL  3.81  3.26   Hemoglobin 13.0 - 17.0 g/dL 89.0  9.9  8.8   HCT 60.9 - 52.0 % 32.0  32.7  28.0   Platelets 150 - 400 K/uL  176  136   NEUT# 1.7 - 7.7 K/uL  6.9    Lymph# 0.7 - 4.0 K/uL  0.7       There is no height or weight on file to calculate BMI.  Orders:  Orders Placed This Encounter  Procedures   Ambulatory referral to Pain Clinic   Meds ordered this encounter  Medications   oxyCODONE  (ROXICODONE ) 15 MG immediate release tablet    Sig: Take 1  tablet (15 mg total) by mouth every 8 (eight) hours as needed. for pain    Dispense:  20 tablet    Refill:  0    This order is correct 15 mg OXY IR q 12 04/26/24     Procedures: No procedures performed  Clinical Data: No additional findings.  ROS:  All other systems negative, except as noted in the HPI. Review of Systems  Objective: Vital Signs: There were no vitals taken for this visit.  Specialty Comments:  No specialty comments available.  PMFS History: Patient Active Problem List   Diagnosis Date Noted   Leg hematoma 01/27/2024   Wound of left leg 01/27/2024   Fall 10/25/2023   Closed 3-part fracture of proximal end of right humerus 10/25/2023   Leg laceration, right, initial encounter 10/25/2023   Acute on chronic heart failure with preserved ejection fraction (HFpEF) (HCC) 03/04/2022   Dyslipidemia 07/17/2021   Lymphedema 07/08/2021   Melena    Gastric polyps    UGIB (upper gastrointestinal bleed) 04/06/2021   Acute encephalopathy 12/21/2020   COPD (chronic obstructive pulmonary disease) (HCC) 12/21/2020   Chronic diastolic (congestive) heart failure (HCC) 11/14/2020   Syncope 10/28/2020   CHF (congestive heart failure) (HCC) 10/28/2020   Chronic venous stasis dermatitis of both lower extremities 08/27/2020   Cellulitis of both lower extremities 08/14/2020   Iron  deficiency anemia 08/14/2020   GERD without esophagitis 06/10/2020   Atrial fibrillation with rapid ventricular response (HCC) 03/24/2020   Acute on chronic right heart failure (HCC) 01/27/2020   Acute respiratory failure with hypoxia (HCC)    Fatty liver disease, nonalcoholic    Shock (HCC)    Personal history of DVT (deep vein thrombosis) 12/26/2019   Chronic anticoagulation 12/26/2019   Palpitations 12/26/2019   Paroxysmal atrial fibrillation (HCC) 12/23/2019   Heart failure, systolic, acute (HCC) 08/24/2019   Acute on chronic congestive heart failure (HCC)    Acute on chronic diastolic CHF  (congestive heart failure) (HCC) 08/23/2019   Chest pain 08/23/2019   Lactic acidosis 08/23/2019   History of pulmonary embolism 08/12/2019   Atrial tachycardia 08/12/2019   Occasional tremors 08/12/2019   OSA (obstructive sleep apnea) 08/12/2019   AKI (acute kidney injury) 08/08/2019   Acute on chronic right-sided heart failure (HCC) 08/06/2019   Acute pulmonary embolism (HCC) 06/08/2019   Normocytic anemia 06/08/2019  Pulmonary emboli (HCC) 06/08/2019   Chronic heart failure with preserved ejection fraction (HCC) 05/30/2019   Class 2 severe obesity due to excess calories with serious comorbidity and body mass index (BMI) of 39.0 to 39.9 in adult 05/30/2019   Morbid obesity with BMI of 40.0-44.9, adult (HCC) 05/30/2019   Hyponatremia    Drug induced constipation    Peripheral edema    Hypotension due to drugs    Hypoalbuminemia due to protein-calorie malnutrition    Thrombocytopenia    Anemia of chronic disease    Cirrhosis of liver without ascites (HCC)    Chronic pain syndrome    Debility 03/13/2019   Portal hypertensive gastropathy (HCC) 03/06/2019   Dyspnea    GIB (gastrointestinal bleeding) 03/04/2019   Mixed hyperlipidemia 01/24/2019   Insomnia 01/24/2019   Hyperlipidemia 01/24/2019   Essential hypertension 01/24/2019   Lumbar disc herniation 01/24/2019   Past Medical History:  Diagnosis Date   Acute on chronic congestive heart failure (HCC)    Acute on chronic diastolic CHF (congestive heart failure) (HCC) 08/23/2019   Acute on chronic right-sided heart failure (HCC) 08/06/2019   Acute respiratory failure with hypoxia (HCC)    Anemia of chronic disease    Atrial fibrillation (HCC) 12/23/2019   Atrial tachycardia 08/12/2019   Benign essential HTN    Chest pain 08/23/2019   Chronic anticoagulation 12/26/2019   Chronic heart failure with preserved EF 05/30/2019   Echo 07/2019: EF 55-60, GR 1 DD   Chronic pain syndrome    Cirrhosis of liver without ascites (HCC)     Class 2 severe obesity due to excess calories with serious comorbidity and body mass index (BMI) of 39.0 to 39.9 in adult 05/30/2019   Debility 03/13/2019   Drug induced constipation    Dyspnea    Fatty liver disease, nonalcoholic    GIB (gastrointestinal bleeding) 03/04/2019   Heart failure, systolic, acute (HCC) 08/24/2019   History of pulmonary embolism 08/12/2019   Hyperlipidemia 01/24/2019   Hypertension 01/24/2019   Hypoalbuminemia due to protein-calorie malnutrition    Hypokalemia    Hyponatremia    Hypotension due to drugs    Insomnia 01/24/2019   Lactic acidosis 08/23/2019   Lumbar disc herniation 01/24/2019   Mixed hyperlipidemia 01/24/2019   Morbid obesity with BMI of 40.0-44.9, adult (HCC) 05/30/2019   Normocytic anemia 06/08/2019   Occasional tremors 08/12/2019   OSA (obstructive sleep apnea) 08/12/2019   Palpitations 12/26/2019   Peripheral edema    Personal history of DVT (deep vein thrombosis) 12/26/2019   Portal hypertensive gastropathy (HCC) 03/06/2019   Seen on EGD 03/06/2019   Pulmonary emboli (HCC) 06/08/2019   Pulmonary embolism (HCC) 06/08/2019   Redness and swelling of lower leg 08/23/2019   Shock (HCC)    Thrombocytopenia     Family History  Problem Relation Age of Onset   Hyperlipidemia Mother    Hypertension Mother    Stroke Mother    CAD Maternal Grandmother    CAD Maternal Grandfather     Past Surgical History:  Procedure Laterality Date   APPLICATION OF WOUND VAC Left 01/27/2024   Procedure: APPLICATION, WOUND VAC;  Surgeon: Genelle Standing, MD;  Location: [facility] OR;  Service: Orthopedics;  Laterality: Left;   APPLICATION OF WOUND VAC Left 02/02/2024   Procedure: APPLICATION, WOUND VAC;  Surgeon: Harden Jerona GAILS, MD;  Location: [facility] OR;  Service: Orthopedics;  Laterality: Left;   BIOPSY  04/08/2021   Procedure: BIOPSY;  Surgeon: Aneita Gwendlyn DASEN, MD;  Location: [facility] ENDOSCOPY;  Service: Endoscopy;;   ESOPHAGOGASTRODUODENOSCOPY N/A 04/08/2021   Procedure:  ESOPHAGOGASTRODUODENOSCOPY (EGD);  Surgeon: Aneita Gwendlyn DASEN, MD;  Location: [facility] ENDOSCOPY;  Service: Endoscopy;  Laterality: N/A;   ESOPHAGOGASTRODUODENOSCOPY (EGD) WITH PROPOFOL  N/A 03/05/2019   Procedure: ESOPHAGOGASTRODUODENOSCOPY (EGD) WITH PROPOFOL ;  Surgeon: Legrand Victory LITTIE DOUGLAS, MD;  Location: [facility] ENDOSCOPY;  Service: Gastroenterology;  Laterality: N/A;  Large Blood Clot Removed   ESOPHAGOGASTRODUODENOSCOPY (EGD) WITH PROPOFOL  N/A 03/06/2019   Procedure: ESOPHAGOGASTRODUODENOSCOPY (EGD) WITH PROPOFOL ;  Surgeon: Eda Iha, MD;  Location: [facility] ENDOSCOPY;  Service: Gastroenterology;  Laterality: N/A;   HEMORROIDECTOMY     INCISION AND DRAINAGE OF DEEP ABSCESS, CALF Left 02/02/2024   Procedure: INCISION AND DRAINAGE OF DEEP ABSCESS, CALF;  Surgeon: Harden Jerona GAILS, MD;  Location: [facility] OR;  Service: Orthopedics;  Laterality: Left;  LEFT LEG DEBRIDEMENT   INCISION AND DRAINAGE OF WOUND Left 01/27/2024   Procedure: IRRIGATION AND DEBRIDEMENT WOUND;  Surgeon: Genelle Standing, MD;  Location: [facility] OR;  Service: Orthopedics;  Laterality: Left;   PRESSURE SENSOR/CARDIOMEMS N/A 11/14/2020   Procedure: PRESSURE SENSOR/CARDIOMEMS;  Surgeon: Rolan Ezra RAMAN, MD;  Location: [facility] INVASIVE CV LAB;  Service: Cardiovascular;  Laterality: N/A;   RIGHT HEART CATH N/A 08/09/2019   Procedure: RIGHT HEART CATH;  Surgeon: Rolan Ezra RAMAN, MD;  Location: [facility] INVASIVE CV LAB;  Service: Cardiovascular;  Laterality: N/A;   RIGHT HEART CATH N/A 11/01/2020   Procedure: RIGHT HEART CATH;  Surgeon: Rolan Ezra RAMAN, MD;  Location: [facility] INVASIVE CV LAB;  Service: Cardiovascular;  Laterality: N/A;   RIGHT HEART CATH N/A 11/14/2020   Procedure: RIGHT HEART CATH;  Surgeon: Rolan Ezra RAMAN, MD;  Location: [facility] INVASIVE CV LAB;  Service: Cardiovascular;  Laterality: N/A;   RIGHT/LEFT HEART CATH AND CORONARY ANGIOGRAPHY N/A 05/10/2020   Procedure: RIGHT/LEFT HEART CATH AND CORONARY ANGIOGRAPHY;  Surgeon: Rolan Ezra RAMAN, MD;  Location: [facility]  INVASIVE CV LAB;  Service: Cardiovascular;  Laterality: N/A;   Social History   Occupational History   Not on file  Tobacco Use   Smoking status: Never   Smokeless tobacco: Never  Vaping Use   Vaping status: Never Used  Substance and Sexual Activity   Alcohol use: Not Currently   Drug use: Not Currently    Types: Marijuana    Comment: Smoked for 30 years   Sexual activity: Not on file       "

## 2024-05-10 ENCOUNTER — Other Ambulatory Visit: Payer: Self-pay | Admitting: Physician Assistant

## 2024-05-10 DIAGNOSIS — Z9889 Other specified postprocedural states: Secondary | ICD-10-CM

## 2024-05-10 DIAGNOSIS — S8011XS Contusion of right lower leg, sequela: Secondary | ICD-10-CM

## 2024-05-10 DIAGNOSIS — G8929 Other chronic pain: Secondary | ICD-10-CM

## 2024-05-11 ENCOUNTER — Telehealth: Payer: Self-pay | Admitting: Physician Assistant

## 2024-05-11 NOTE — Telephone Encounter (Signed)
 Rx was sent to pharmacy today.

## 2024-05-11 NOTE — Telephone Encounter (Signed)
 Pt called asking if [name] can please send his meds in asap. Pt number is [phone]. Please call pt when sent to pharmacy

## 2024-05-16 ENCOUNTER — Other Ambulatory Visit: Payer: Self-pay | Admitting: Physician Assistant

## 2024-05-16 DIAGNOSIS — Z9889 Other specified postprocedural states: Secondary | ICD-10-CM
